# Patient Record
Sex: Male | Born: 1950 | Race: Black or African American | Hispanic: No | Marital: Married | Smoking: Former smoker
Health system: Southern US, Community
[De-identification: ages and names within clinical notes are randomized; demographics above are authoritative.]

## PROBLEM LIST (undated history)

## (undated) DIAGNOSIS — I509 Heart failure, unspecified: Secondary | ICD-10-CM

## (undated) DIAGNOSIS — N189 Chronic kidney disease, unspecified: Secondary | ICD-10-CM

## (undated) DIAGNOSIS — Z95810 Presence of automatic (implantable) cardiac defibrillator: Secondary | ICD-10-CM

## (undated) DIAGNOSIS — I255 Ischemic cardiomyopathy: Secondary | ICD-10-CM

## (undated) DIAGNOSIS — N200 Calculus of kidney: Secondary | ICD-10-CM

## (undated) DIAGNOSIS — I472 Ventricular tachycardia, unspecified: Secondary | ICD-10-CM

## (undated) DIAGNOSIS — E11319 Type 2 diabetes mellitus with unspecified diabetic retinopathy without macular edema: Secondary | ICD-10-CM

## (undated) DIAGNOSIS — Z7901 Long term (current) use of anticoagulants: Secondary | ICD-10-CM

## (undated) DIAGNOSIS — I4892 Unspecified atrial flutter: Secondary | ICD-10-CM

## (undated) DIAGNOSIS — E119 Type 2 diabetes mellitus without complications: Secondary | ICD-10-CM

## (undated) DIAGNOSIS — I5022 Chronic systolic (congestive) heart failure: Secondary | ICD-10-CM

## (undated) DIAGNOSIS — E7800 Pure hypercholesterolemia, unspecified: Secondary | ICD-10-CM

## (undated) DIAGNOSIS — I2510 Atherosclerotic heart disease of native coronary artery without angina pectoris: Secondary | ICD-10-CM

## (undated) DIAGNOSIS — I10 Essential (primary) hypertension: Secondary | ICD-10-CM

## (undated) HISTORY — DX: Ventricular tachycardia, unspecified: I47.20

## (undated) HISTORY — DX: Chronic kidney disease, unspecified: N18.9

## (undated) HISTORY — DX: Essential (primary) hypertension: I10

## (undated) HISTORY — DX: Unspecified atrial flutter: I48.92

## (undated) HISTORY — DX: Calculus of kidney: N20.0

## (undated) HISTORY — DX: Chronic systolic (congestive) heart failure: I50.22

## (undated) HISTORY — DX: Atherosclerotic heart disease of native coronary artery without angina pectoris: I25.10

## (undated) HISTORY — DX: Type 2 diabetes mellitus without complications: E11.9

## (undated) HISTORY — DX: Ischemic cardiomyopathy: I25.5

## (undated) HISTORY — DX: Pure hypercholesterolemia, unspecified: E78.00

## (undated) HISTORY — DX: Ventricular tachycardia: I47.2

---

## 2000-03-02 HISTORY — PX: PERCUTANEOUS CORONARY STENT INTERVENTION (PCI-S): SHX6016

## 2000-03-31 ENCOUNTER — Inpatient Hospital Stay (HOSPITAL_COMMUNITY): Admission: EM | Admit: 2000-03-31 | Discharge: 2000-04-02 | Payer: Self-pay | Admitting: Emergency Medicine

## 2000-03-31 ENCOUNTER — Encounter: Payer: Self-pay | Admitting: Emergency Medicine

## 2000-07-31 HISTORY — PX: PERCUTANEOUS CORONARY STENT INTERVENTION (PCI-S): SHX6016

## 2000-08-18 ENCOUNTER — Inpatient Hospital Stay (HOSPITAL_COMMUNITY): Admission: EM | Admit: 2000-08-18 | Discharge: 2000-08-23 | Payer: Self-pay | Admitting: Emergency Medicine

## 2000-08-18 ENCOUNTER — Encounter: Payer: Self-pay | Admitting: Emergency Medicine

## 2000-08-24 ENCOUNTER — Inpatient Hospital Stay (HOSPITAL_COMMUNITY): Admission: EM | Admit: 2000-08-24 | Discharge: 2000-08-25 | Payer: Self-pay | Admitting: Emergency Medicine

## 2000-08-25 ENCOUNTER — Encounter: Payer: Self-pay | Admitting: Emergency Medicine

## 2000-09-01 ENCOUNTER — Encounter: Admission: RE | Admit: 2000-09-01 | Discharge: 2000-09-01 | Payer: Self-pay | Admitting: Family Medicine

## 2000-11-29 ENCOUNTER — Encounter: Admission: RE | Admit: 2000-11-29 | Discharge: 2000-11-29 | Payer: Self-pay | Admitting: Sports Medicine

## 2000-12-01 ENCOUNTER — Encounter: Payer: Self-pay | Admitting: Emergency Medicine

## 2000-12-01 ENCOUNTER — Emergency Department (HOSPITAL_COMMUNITY): Admission: EM | Admit: 2000-12-01 | Discharge: 2000-12-01 | Payer: Self-pay

## 2001-10-08 ENCOUNTER — Emergency Department (HOSPITAL_COMMUNITY): Admission: EM | Admit: 2001-10-08 | Discharge: 2001-10-08 | Payer: Self-pay | Admitting: *Deleted

## 2001-10-08 ENCOUNTER — Encounter: Payer: Self-pay | Admitting: *Deleted

## 2004-08-30 HISTORY — PX: PERCUTANEOUS CORONARY STENT INTERVENTION (PCI-S): SHX6016

## 2004-09-20 ENCOUNTER — Inpatient Hospital Stay (HOSPITAL_COMMUNITY): Admission: EM | Admit: 2004-09-20 | Discharge: 2004-09-23 | Payer: Self-pay | Admitting: Emergency Medicine

## 2004-09-22 ENCOUNTER — Encounter: Payer: Self-pay | Admitting: Cardiology

## 2004-09-22 ENCOUNTER — Ambulatory Visit: Payer: Self-pay | Admitting: Cardiology

## 2004-10-07 ENCOUNTER — Ambulatory Visit: Payer: Self-pay | Admitting: Cardiology

## 2004-10-14 ENCOUNTER — Ambulatory Visit: Payer: Self-pay | Admitting: Cardiology

## 2005-01-07 ENCOUNTER — Ambulatory Visit: Payer: Self-pay

## 2005-01-08 ENCOUNTER — Ambulatory Visit: Payer: Self-pay | Admitting: Cardiology

## 2005-03-02 HISTORY — PX: CARDIAC DEFIBRILLATOR PLACEMENT: SHX171

## 2005-10-02 ENCOUNTER — Ambulatory Visit: Payer: Self-pay | Admitting: Internal Medicine

## 2005-11-16 ENCOUNTER — Ambulatory Visit: Payer: Self-pay | Admitting: Cardiology

## 2005-11-30 ENCOUNTER — Ambulatory Visit: Payer: Self-pay

## 2006-01-04 ENCOUNTER — Ambulatory Visit: Payer: Self-pay | Admitting: Internal Medicine

## 2006-02-10 ENCOUNTER — Ambulatory Visit: Payer: Self-pay | Admitting: Internal Medicine

## 2006-02-17 ENCOUNTER — Observation Stay (HOSPITAL_COMMUNITY): Admission: AD | Admit: 2006-02-17 | Discharge: 2006-02-18 | Payer: Self-pay | Admitting: Internal Medicine

## 2006-02-17 ENCOUNTER — Ambulatory Visit: Payer: Self-pay | Admitting: Internal Medicine

## 2006-02-25 ENCOUNTER — Ambulatory Visit: Payer: Self-pay

## 2006-04-29 DIAGNOSIS — I2510 Atherosclerotic heart disease of native coronary artery without angina pectoris: Secondary | ICD-10-CM

## 2006-04-29 DIAGNOSIS — I252 Old myocardial infarction: Secondary | ICD-10-CM

## 2006-04-29 DIAGNOSIS — N200 Calculus of kidney: Secondary | ICD-10-CM

## 2006-04-29 DIAGNOSIS — E7800 Pure hypercholesterolemia, unspecified: Secondary | ICD-10-CM | POA: Insufficient documentation

## 2006-04-29 DIAGNOSIS — I10 Essential (primary) hypertension: Secondary | ICD-10-CM

## 2006-05-12 ENCOUNTER — Ambulatory Visit: Payer: Self-pay | Admitting: Internal Medicine

## 2008-08-03 ENCOUNTER — Telehealth: Payer: Self-pay | Admitting: Internal Medicine

## 2008-08-04 ENCOUNTER — Telehealth: Payer: Self-pay | Admitting: Adult Health

## 2008-08-06 ENCOUNTER — Ambulatory Visit: Payer: Self-pay | Admitting: Internal Medicine

## 2008-08-11 DIAGNOSIS — I255 Ischemic cardiomyopathy: Secondary | ICD-10-CM

## 2008-08-11 DIAGNOSIS — E1121 Type 2 diabetes mellitus with diabetic nephropathy: Secondary | ICD-10-CM | POA: Insufficient documentation

## 2008-08-11 DIAGNOSIS — E785 Hyperlipidemia, unspecified: Secondary | ICD-10-CM

## 2008-08-11 DIAGNOSIS — N259 Disorder resulting from impaired renal tubular function, unspecified: Secondary | ICD-10-CM | POA: Insufficient documentation

## 2008-08-14 ENCOUNTER — Ambulatory Visit: Payer: Self-pay | Admitting: Internal Medicine

## 2008-08-14 DIAGNOSIS — Z95810 Presence of automatic (implantable) cardiac defibrillator: Secondary | ICD-10-CM

## 2008-08-14 DIAGNOSIS — I472 Ventricular tachycardia: Secondary | ICD-10-CM

## 2008-10-10 ENCOUNTER — Encounter: Payer: Self-pay | Admitting: Cardiology

## 2008-11-13 ENCOUNTER — Ambulatory Visit: Payer: Self-pay | Admitting: Cardiology

## 2008-11-13 DIAGNOSIS — R079 Chest pain, unspecified: Secondary | ICD-10-CM

## 2008-12-24 ENCOUNTER — Telehealth (INDEPENDENT_AMBULATORY_CARE_PROVIDER_SITE_OTHER): Payer: Self-pay | Admitting: *Deleted

## 2008-12-25 ENCOUNTER — Ambulatory Visit: Payer: Self-pay | Admitting: Cardiology

## 2008-12-25 ENCOUNTER — Encounter (HOSPITAL_COMMUNITY): Admission: RE | Admit: 2008-12-25 | Discharge: 2009-02-27 | Payer: Self-pay | Admitting: Cardiology

## 2008-12-25 ENCOUNTER — Ambulatory Visit: Payer: Self-pay

## 2008-12-26 ENCOUNTER — Encounter (INDEPENDENT_AMBULATORY_CARE_PROVIDER_SITE_OTHER): Payer: Self-pay | Admitting: *Deleted

## 2008-12-26 LAB — CONVERTED CEMR LAB
AST: 18 units/L (ref 0–37)
Albumin: 3.5 g/dL (ref 3.5–5.2)
BUN: 16 mg/dL (ref 6–23)
CO2: 25 meq/L (ref 19–32)
Calcium: 8.9 mg/dL (ref 8.4–10.5)
Cholesterol: 177 mg/dL (ref 0–200)
Glucose, Bld: 321 mg/dL — ABNORMAL HIGH (ref 70–99)
Potassium: 4.7 meq/L (ref 3.5–5.1)
Sodium: 135 meq/L (ref 135–145)
Total CHOL/HDL Ratio: 5
Total Protein: 7.8 g/dL (ref 6.0–8.3)
Triglycerides: 61 mg/dL (ref 0.0–149.0)
VLDL: 12.2 mg/dL (ref 0.0–40.0)

## 2008-12-27 ENCOUNTER — Encounter: Payer: Self-pay | Admitting: Cardiology

## 2009-03-15 ENCOUNTER — Encounter (INDEPENDENT_AMBULATORY_CARE_PROVIDER_SITE_OTHER): Payer: Self-pay | Admitting: *Deleted

## 2009-05-06 ENCOUNTER — Encounter: Payer: Self-pay | Admitting: Cardiology

## 2009-11-18 ENCOUNTER — Encounter (INDEPENDENT_AMBULATORY_CARE_PROVIDER_SITE_OTHER): Payer: Self-pay | Admitting: *Deleted

## 2010-02-27 ENCOUNTER — Encounter: Payer: Self-pay | Admitting: Cardiology

## 2010-03-19 ENCOUNTER — Encounter (INDEPENDENT_AMBULATORY_CARE_PROVIDER_SITE_OTHER): Payer: Self-pay | Admitting: *Deleted

## 2010-04-01 NOTE — Letter (Signed)
Summary: Appointment - Reminder 2  [facility], Main Office  [address]   [place], [place] [address]   Phone: [phone]  Fax: [phone]     March 15, 2009 MRN: [id number]   MARVEN VELEY [address] [place], [place]  [address]   Dear Mr. Hiley,  Our records indicate that it is time to schedule a follow-up appointment. Dr.Crenshaw recommended that you follow up with [place] in March,2011. It is very important that we reach you to schedule this appointment. We look forward to participating in your health care needs. Please contact us at the number listed above at your earliest convenience to schedule your appointment.  If you are unable to make an appointment at this time, give [place] a call so we can update our records.     Sincerely,   Glass blower/designer

## 2010-04-01 NOTE — Letter (Signed)
Summary: Device-Delinquent Check  [facility], Main Office  [address]   [place], [place] [address]   Phone: [phone]  Fax: [phone]     November 18, 2009 MRN: [id number]   [name] [name] [address] [place], [place]  [address]   Dear [name],  According to our records, you have not had your implanted device checked in the recommended period of time.  We are unable to determine appropriate device function without checking your device on a regular basis.  Please call our office to schedule an appointment as soon as possible.  If you are having your device checked by another physician, please call us so that we may update our records.  Thank you,  Altha Harm, LPN  November 18, 2009 9:06 AM  [facility]

## 2010-04-01 NOTE — Letter (Signed)
Summary: [facility] Office Note  [facility] Office Note   Imported By: Roderic Ovens 06/03/2009 16:49:45  _____________________________________________________________________  External Attachment:    Type:   Image     Comment:   External Document

## 2010-04-02 ENCOUNTER — Encounter: Payer: Self-pay | Admitting: Internal Medicine

## 2010-04-02 ENCOUNTER — Encounter (INDEPENDENT_AMBULATORY_CARE_PROVIDER_SITE_OTHER): Payer: BC Managed Care – PPO | Admitting: Internal Medicine

## 2010-04-02 DIAGNOSIS — I2589 Other forms of chronic ischemic heart disease: Secondary | ICD-10-CM

## 2010-04-02 DIAGNOSIS — I5022 Chronic systolic (congestive) heart failure: Secondary | ICD-10-CM

## 2010-04-03 NOTE — Letter (Signed)
Summary: Device-Delinquent Check  [facility], Main Office  [address]   [place], [place] [address]   Phone: [phone]  Fax: [phone]     March 19, 2010 MRN: [id number]   [name] [name] [address] [place], [place]  [address]   Dear Mr. Schabel,  According to our records, you have not had your implanted device checked in the recommended period of time.  We are unable to determine appropriate device function without checking your device on a regular basis.  Please call our office to schedule an appointment , with Dr Ladona Ridgel, as soon as possible.  If you are having your device checked by another physician, please call us so that we may update our records.  Thank you,  Letta Moynahan, EMT  March 19, 2010 2:46 PM  [facility] certified

## 2010-04-03 NOTE — Cardiovascular Report (Signed)
Summary: Notice Returned - Unable to [employer] Returned - Unable to [employer]   Imported By: Debby Freiberg 03/20/2010 11:40:29  _____________________________________________________________________  External Attachment:    Type:   Image     Comment:   External Document

## 2010-04-03 NOTE — Letter (Signed)
Summary: [facility] Referral Response Note   [facility] Referral Response Note   Imported By: Roderic Ovens 03/28/2010 12:44:16  _____________________________________________________________________  External Attachment:    Type:   Image     Comment:   External Document

## 2010-04-04 ENCOUNTER — Ambulatory Visit (INDEPENDENT_AMBULATORY_CARE_PROVIDER_SITE_OTHER): Payer: BC Managed Care – PPO | Admitting: Cardiology

## 2010-04-04 ENCOUNTER — Encounter: Payer: Self-pay | Admitting: Cardiology

## 2010-04-04 ENCOUNTER — Ambulatory Visit: Admit: 2010-04-04 | Payer: Self-pay | Admitting: Cardiology

## 2010-04-04 ENCOUNTER — Other Ambulatory Visit: Payer: Self-pay | Admitting: Cardiology

## 2010-04-04 DIAGNOSIS — I10 Essential (primary) hypertension: Secondary | ICD-10-CM

## 2010-04-04 DIAGNOSIS — I2510 Atherosclerotic heart disease of native coronary artery without angina pectoris: Secondary | ICD-10-CM

## 2010-04-04 DIAGNOSIS — E7800 Pure hypercholesterolemia, unspecified: Secondary | ICD-10-CM

## 2010-04-04 DIAGNOSIS — I2589 Other forms of chronic ischemic heart disease: Secondary | ICD-10-CM

## 2010-04-04 LAB — HEPATIC FUNCTION PANEL
ALT: 15 U/L (ref 0–53)
AST: 22 U/L (ref 0–37)
Bilirubin, Direct: 0.2 mg/dL (ref 0.0–0.3)
Total Bilirubin: 0.4 mg/dL (ref 0.3–1.2)
Total Protein: 7.3 g/dL (ref 6.0–8.3)

## 2010-04-04 LAB — LIPID PANEL
HDL: 34.2 mg/dL — ABNORMAL LOW (ref >39.00)
LDL Cholesterol: 97 mg/dL (ref 0–99)
VLDL: 5.8 mg/dL (ref 0.0–40.0)

## 2010-04-04 LAB — BASIC METABOLIC PANEL
CO2: 26 mEq/L (ref 19–32)
Calcium: 9.3 mg/dL (ref 8.4–10.5)
Creatinine, Ser: 0.9 mg/dL (ref 0.4–1.5)
Potassium: 4.6 mEq/L (ref 3.5–5.1)

## 2010-04-09 NOTE — Assessment & Plan Note (Signed)
Summary: pc2   Vital Signs:  Patient profile:   60 year old male Height:      71 inches Weight:      247 pounds BMI:     34.57 Pulse rate:   68 / minute BP sitting:   140 / 90  (left arm)  Vitals Entered By: Laurance Flatten CMA (April 02, 2010 9:48 AM)  Primary Provider:  [facility] [name] Lesly Rubenstein   History of Present Illness: [name] [name] returns today for followup.  He has an ICM, CHF (class 1-2) and HTN. He notes that he received an ICD shock several weeks ago when he was chasing one of his beagles while rabbit hunting.  He denies c/p or sob or PND or peripheral edema.  Current Medications (verified): 1)  Aspirin 325 Mg Tabs (Aspirin) 2)  Carvedilol 6.25 Mg Tabs (Carvedilol) .... Take One Tablet By Mouth Twice A Day 3)  Cozaar 50 Mg Tabs (Losartan Potassium) .Marland Kitchen.. 1 Tab Once Daily 4)  Crestor 20 Mg Tabs (Rosuvastatin Calcium) .Marland Kitchen.. 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    Physical Exam  General:  well-developed well-nourished in no acute distress. Skin is warm and dry. Head:  HEENT is normal. Eyes:  PERRLA/EOM intact; conjunctiva and lids normal. Mouth:  Teeth, gums and palate normal. Oral mucosa normal. Neck:  supple with no bruits. No thyromegaly. Chest Wall:  Well healed ICD incision. Lungs:  clear to auscultation Heart:  regular rate and rhythm Abdomen:  soft and nontender. No masses  palpated. Msk:  Back normal, normal gait. Muscle strength and tone normal. Pulses:  pulses normal in all 4 extremities Extremities:  no edema. Neurologic:  grossly intact.   Impression & Recommendations:  Problem # 1:  AUTOMATIC IMPLANTABLE CARDIAC DEFIBRILLATOR SITU (ICD-V45.02) His device is working normally. Will recheck in several months.  Problem # 2:  VENTRICULAR TACHYCARDIA (ICD-427.1) His symptoms are well controlled. Will follow. His updated medication list for this problem includes:    Aspirin 325 Mg Tabs (Aspirin)    Carvedilol 6.25 Mg Tabs (Carvedilol) .Marland Kitchen... Take one tablet by mouth twice a day  Problem # 3:  CARDIOMYOPATHY, ISCHEMIC (ICD-414.8) He denies anginal symptoms. His updated medication list for this problem includes:    Aspirin 325 Mg Tabs (Aspirin)    Carvedilol 6.25 Mg Tabs (Carvedilol) .Marland Kitchen... Take one tablet by mouth twice a day    Cozaar 50 Mg Tabs (Losartan potassium) .Marland Kitchen... 1 tab once daily  Patient Instructions: 1)  Your physician wants you to follow-up in: 3 months with device clinic and 12 months with [name] [name] will receive a reminder letter in the mail two months in advance. If you don't receive a letter, please call our office to schedule the follow-up appointment. 2)  Your physician recommends that you continue on your current medications as directed. Please refer to the Current Medication list given to you today.       ICD Specifications Following MD:  [name] Bunting, MD     ICD Vendor:  [facility]     ICD Model  Number:  7232     ICD Serial Number:  [id number] ICD DOI:  02/17/2006     ICD Implanting MD:  [name] Bunting, MD  Lead 1:    Location: RV     DOI: 02/17/2006     Model #: 9147     Serial #: [id number]     Status: active  Indications::  ICM   ICD Follow Up Battery Voltage:  3.04 V     Charge Time:  7.80 seconds     Underlying rhythm:  SR ICD Dependent:  No       ICD Device Measurements Right Ventricle:  Amplitude: 7.2 mV,  Impedance: 424 ohms, Threshold: 1.4 V at 0.2 msec Shock Impedance: 53/68 ohms   Episodes MS Episodes:  0     Shock:  1     ATP:  19     Nonsustained:  39     Ventricular Pacing:  <0.1%  Brady Parameters Mode VVI     Lower Rate Limit:  40      Tachy Zones VF:  200     VT:  250     VT1:  171     Next Cardiology Appt Due:  07/01/2010 Tech Comments:  4 FVT EPISODES AND 14 VT EPISODES---SEVERAL EPISODES WITH ATP THERAPY.  EPISODE ON 02-10-10 2 UNSUCCESSFUL ATP RESULTING IN 15 JOULE SHOCK SUCCESSFUL.  PT WAS HUNTING AND RUNNING AT TIME OF EPISODE.  NORMAL DEVICE FUNCTION. INSTALLED LIA AND DEMONSTRATED TONE FOR PT--PT AWARE TO CALL IF HEARD.  NO CHANGES MADE. PT PREFERS [facility] THAN [facility]. ROV IN 3 MTHS W/DEVICE CLINIC. [name] [name]  April 02, 2010 10:04 AM MD Comments:  Agree with above. Unclear whether his episodes are VT. I suspect sinus tachy.

## 2010-04-09 NOTE — Assessment & Plan Note (Signed)
Summary: 6 month/past [email or web address]  if he shows up, he needs his devic...   Vital Signs:  Patient profile:   60 year old male Height:      71 inches Weight:      247 pounds BMI:     34.57 Pulse rate:   85 / minute Resp:     16 per minute BP sitting:   120 / 80  (left arm)  Vitals Entered By: Kem Parkinson (April 04, 2010 8:55 AM)   Primary Provider:  [facility] Dr Lesly Rubenstein   History of Present Illness: Pleasant gentleman with history of coronary disease who returns for followup. I have not seen him since September of 2007. He has had previous PCI of his right coronary artery and his LAD. His last cardiac catheterization was performed on September 21, 2004 in the setting of an acute anterior myocardial infarction. His ejection fraction was 35-40%. He had a 99% mid LAD lesion as well as a 90% distal LAD lesion. There was a 70% mid to distal circumflex and the right coronary artery was totally occluded. The patient had PCI of his mid and distal  LAD at that time. The mid LAD lesion was treated with a drug eluding stent. He's also had a previous ICD. His last echocardiogram was performed on January 07, 2005. His ejection fraction was 30-40%. There was mild to moderate mitral regurgitation. Last Myoview was performed in October of 2010. Ejection fraction was 27%. There was a prior apical and inferior infarct but no ischemia. I last saw him in September 2010. Since then, the patient has dyspnea with more extreme activities but not with routine activities. It is relieved with rest. It is not associated with chest pain. There is no orthopnea, PND or pedal edema. There is no syncope or palpitations. There is no exertional chest pain. His ICD fired several weeks ago while he was chasing his adult. He has seen Dr. Ladona Ridgel since then.   Current Medications (verified): 1)  Aspirin 325 Mg Tabs (Aspirin) .... Tab By Mouth Once Daily 2)  Carvedilol 6.25 Mg Tabs (Carvedilol) .... Take One Tablet  By Mouth Twice A Day 3)  Cozaar 50 Mg Tabs (Losartan Potassium) .Marland Kitchen.. 1 Tab Once Daily 4)  Crestor 20 Mg Tabs (Rosuvastatin Calcium) .Marland Kitchen.. 1 Once Daily 5)  Insulin .Marland Kitchen.. 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 Remaining systems are negative.   Physical Exam  General:  Well-developed well-nourished in no acute distress.  Skin is warm and dry.  HEENT is normal.  Neck is supple. No thyromegaly.  Chest is clear to auscultation with normal expansion.  Cardiovascular exam is regular rate and rhythm.  Abdominal exam nontender or distended. No masses palpated. Extremities show no edema. neuro grossly intact  Electrocardiogram shows sinus rhythm with PVCs, prior  inferior and septal infarct.   Impression & Recommendations:  Problem # 1:  AUTOMATIC IMPLANTABLE CARDIAC DEFIBRILLATOR SITU (ICD-V45.02) Managed by electrophysiology.  Problem # 2:  RENAL INSUFFICIENCY (ICD-588.9) Check potassium and renal function.  Problem # 3:  DIABETES MELLITUS, TYPE II (ICD-250.00) Management per primary care. His updated medication list for this  problem includes:    Aspirin 325 Mg Tabs (Aspirin) .Marland Kitchen... Tab by mouth once daily    Cozaar 50 Mg Tabs (Losartan potassium) .Marland Kitchen... 1 tab once daily  Problem # 4:  DYSLIPIDEMIA (ICD-272.4) Continue statin. Check lipids and liver. His updated medication list for this problem includes:    Crestor 20 Mg Tabs (Rosuvastatin calcium) .Marland Kitchen... 1 once daily  Orders: TLB-Lipid Panel (80061-LIPID) TLB-Hepatic/Liver Function Pnl (80076-HEPATIC)  Problem # 5:  CARDIOMYOPATHY, ISCHEMIC (ICD-414.8) Continue ARB and beta blocker. His updated medication list for this problem includes:    Aspirin 325 Mg Tabs (Aspirin) .Marland Kitchen... Tab by mouth once daily    Carvedilol 6.25 Mg Tabs (Carvedilol) .Marland Kitchen... Take one tablet by mouth twice a day    Cozaar 50 Mg Tabs (Losartan potassium) .Marland Kitchen... 1 tab once daily  Problem # 6:  HYPERTENSION, BENIGN SYSTEMIC (ICD-401.1) Blood pressure controlled on present medications. Will continue. His updated medication list for this problem includes:    Aspirin 325 Mg Tabs (Aspirin) .Marland Kitchen... Tab by mouth once daily    Carvedilol 6.25 Mg Tabs (Carvedilol) .Marland Kitchen... Take one tablet by mouth twice a day    Cozaar 50 Mg Tabs (Losartan potassium) .Marland Kitchen... 1 tab once daily  Orders: TLB-BMP (Basic Metabolic Panel-BMET) (80048-METABOL)  Problem # 7:  CORONARY, ARTERIOSCLEROSIS (ICD-414.00) Continue aspirin, beta blocker and statin. His updated medication list for this problem includes:    Aspirin 325 Mg Tabs (Aspirin) .Marland Kitchen... Tab by mouth once daily    Carvedilol 6.25 Mg Tabs (Carvedilol) .Marland Kitchen... Take one tablet by mouth twice a day  Patient Instructions: 1)  Your physician wants you to follow-up in:one year   You will receive a reminder letter in the mail two months in advance. If you don't receive a letter, please call our office to schedule the follow-up appointment.   Orders Added: 1)  TLB-BMP (Basic Metabolic Panel-BMET) [80048-METABOL] 2)  TLB-Lipid Panel [80061-LIPID] 3)  TLB-Hepatic/Liver  Function Pnl [80076-HEPATIC]

## 2010-04-14 ENCOUNTER — Encounter (INDEPENDENT_AMBULATORY_CARE_PROVIDER_SITE_OTHER): Payer: Self-pay | Admitting: *Deleted

## 2010-04-23 NOTE — Letter (Signed)
Summary: Custom - Lipid  [facility], Main Office  [address]   [place], [place] [address]   Phone: [phone]  Fax: [phone]     April 14, 2010 MRN: [id number]   [name] [name] [address] [place], [place]  [address]   Dear [name],  We have reviewed your cholesterol results.  They are as follows:     Total Cholesterol:    137 (Desirable: less than 200)       HDL  Cholesterol:     34.20  (Desirable: greater than 40 for men and 50 for women)       LDL Cholesterol:       97  (Desirable: less than 100 for low risk and less than 70 for moderate to high risk)       Triglycerides:       29.0  (Desirable: less than 150)  Our recommendations include:These numbers are alittle higher than Dr Ludwig Clarks goal for you cholestrol. I have been unable to reach you by phone, please give me a call so we can discuss the changes in your medicine Dr [name] would like to make. Thank You, [name] [name]   Call our office at the number listed above if you have any questions.  Lowering your LDL cholesterol is important, but it is only one of a large number of "risk factors" that may indicate that you are at risk for heart disease, stroke or other complications of hardening of the arteries.  Other risk factors include:   A.  Cigarette Smoking* B.  High Blood Pressure* C.  Obesity* D.   Low HDL Cholesterol (see yours above)* E.   Diabetes Mellitus (higher risk if your is uncontrolled) F.  Family history of premature heart disease G.  Previous history of stroke or cardiovascular disease    *These are risk factors YOU HAVE CONTROL OVER.  For more information, visit .  There is now evidence that lowering the TOTAL CHOLESTEROL AND LDL CHOLESTEROL can reduce the risk of heart disease.  The American Heart Association recommends the following guidelines for the treatment of elevated cholesterol:  1.  If there is now current heart disease and less than two risk factors, TOTAL CHOLESTEROL  should be less than 200 and LDL CHOLESTEROL should be less than 100. 2.  If there is current heart disease or two or more risk factors, TOTAL CHOLESTEROL should be less than 200 and LDL CHOLESTEROL should be less than 70.  A diet low in cholesterol, saturated fat, and calories is the cornerstone of treatment for elevated cholesterol.  Cessation of smoking and exercise are also important in the management of elevated cholesterol and preventing vascular disease.  Studies have shown that 30 to 60 minutes of physical activity most days can help lower blood pressure, lower cholesterol, and keep your weight at a healthy level.  Drug therapy is used when cholesterol levels do not respond to therapeutic lifestyle changes (smoking cessation, diet, and exercise) and remains unacceptably high.  If medication is started, it is important to have you levels checked periodically to evaluate the need for further treatment options.  Thank you,    [employer] Team

## 2010-04-25 ENCOUNTER — Emergency Department (HOSPITAL_COMMUNITY): Payer: BC Managed Care – PPO

## 2010-04-25 ENCOUNTER — Inpatient Hospital Stay (HOSPITAL_COMMUNITY)
Admission: EM | Admit: 2010-04-25 | Discharge: 2010-04-28 | DRG: 138 | Disposition: A | Discharge status: Home / Self Care | Payer: BC Managed Care – PPO | Attending: Cardiology | Admitting: Cardiology

## 2010-04-25 DIAGNOSIS — I472 Ventricular tachycardia, unspecified: Principal | ICD-10-CM | POA: Diagnosis present

## 2010-04-25 DIAGNOSIS — I252 Old myocardial infarction: Secondary | ICD-10-CM

## 2010-04-25 DIAGNOSIS — Z9861 Coronary angioplasty status: Secondary | ICD-10-CM

## 2010-04-25 DIAGNOSIS — N189 Chronic kidney disease, unspecified: Secondary | ICD-10-CM | POA: Diagnosis present

## 2010-04-25 DIAGNOSIS — I2510 Atherosclerotic heart disease of native coronary artery without angina pectoris: Secondary | ICD-10-CM | POA: Diagnosis present

## 2010-04-25 DIAGNOSIS — I2589 Other forms of chronic ischemic heart disease: Secondary | ICD-10-CM | POA: Diagnosis present

## 2010-04-25 DIAGNOSIS — Z880 Allergy status to penicillin: Secondary | ICD-10-CM

## 2010-04-25 DIAGNOSIS — Z87891 Personal history of nicotine dependence: Secondary | ICD-10-CM

## 2010-04-25 DIAGNOSIS — I129 Hypertensive chronic kidney disease with stage 1 through stage 4 chronic kidney disease, or unspecified chronic kidney disease: Secondary | ICD-10-CM | POA: Diagnosis present

## 2010-04-25 DIAGNOSIS — E785 Hyperlipidemia, unspecified: Secondary | ICD-10-CM | POA: Diagnosis present

## 2010-04-25 DIAGNOSIS — E119 Type 2 diabetes mellitus without complications: Secondary | ICD-10-CM | POA: Diagnosis present

## 2010-04-25 DIAGNOSIS — E7800 Pure hypercholesterolemia, unspecified: Secondary | ICD-10-CM | POA: Diagnosis present

## 2010-04-25 DIAGNOSIS — Z7982 Long term (current) use of aspirin: Secondary | ICD-10-CM

## 2010-04-25 DIAGNOSIS — Z794 Long term (current) use of insulin: Secondary | ICD-10-CM

## 2010-04-25 DIAGNOSIS — I4729 Other ventricular tachycardia: Principal | ICD-10-CM | POA: Diagnosis present

## 2010-04-25 DIAGNOSIS — Z95810 Presence of automatic (implantable) cardiac defibrillator: Secondary | ICD-10-CM

## 2010-04-25 LAB — CBC
HCT: 42.1 % (ref 39.0–52.0)
Hemoglobin: 14.3 g/dL (ref 13.0–17.0)
MCHC: 34 g/dL (ref 30.0–36.0)
RBC: 5.23 MIL/uL (ref 4.22–5.81)

## 2010-04-25 LAB — POCT I-STAT, CHEM 8
BUN: 17 mg/dL (ref 6–23)
Calcium, Ion: 1.14 mmol/L (ref 1.12–1.32)
Chloride: 105 mEq/L (ref 96–112)
Creatinine, Ser: 1.2 mg/dL (ref 0.4–1.5)
Glucose, Bld: 273 mg/dL — ABNORMAL HIGH (ref 70–99)
HCT: 47 % (ref 39.0–52.0)
Hemoglobin: 16 g/dL (ref 13.0–17.0)
Potassium: 4.3 meq/L (ref 3.5–5.1)
Sodium: 137 mEq/L (ref 135–145)
TCO2: 21 mmol/L (ref 0–100)

## 2010-04-25 LAB — DIFFERENTIAL
Basophils Absolute: 0 10*3/uL (ref 0.0–0.1)
Eosinophils Relative: 4 % (ref 0–5)
Neutro Abs: 2.1 10*3/uL (ref 1.7–7.7)

## 2010-04-26 DIAGNOSIS — I472 Ventricular tachycardia: Secondary | ICD-10-CM

## 2010-04-26 LAB — POCT CARDIAC MARKERS
CKMB, poc: 1.1 ng/mL (ref 1.0–8.0)
Myoglobin, poc: 78.4 ng/mL (ref 12–200)
Troponin i, poc: <0.05 ng/mL (ref 0.00–0.09)

## 2010-04-26 LAB — GLUCOSE, CAPILLARY
Glucose-Capillary: 199 mg/dL — ABNORMAL HIGH (ref 70–99)
Glucose-Capillary: 86 mg/dL (ref 70–99)

## 2010-04-26 LAB — MAGNESIUM: Magnesium: 1.9 mg/dL (ref 1.5–2.5)

## 2010-04-26 LAB — CARDIAC PANEL(CRET KIN+CKTOT+MB+TROPI)
CK, MB: 2 ng/mL (ref 0.3–4.0)
Relative Index: 1.4 (ref 0.0–2.5)
Troponin I: 0.05 ng/mL (ref 0.00–0.06)

## 2010-04-26 LAB — BASIC METABOLIC PANEL
BUN: 11 mg/dL (ref 6–23)
CO2: 28 mEq/L (ref 19–32)
Creatinine, Ser: 0.98 mg/dL (ref 0.4–1.5)
GFR calc Af Amer: >60 mL/min (ref >60)
Glucose, Bld: 243 mg/dL — ABNORMAL HIGH (ref 70–99)
Potassium: 4.5 mEq/L (ref 3.5–5.1)
Sodium: 140 mEq/L (ref 135–145)

## 2010-04-26 LAB — CK TOTAL AND CKMB (NOT AT ARMC)
CK, MB: 2.1 ng/mL (ref 0.3–4.0)
Relative Index: 1.3 (ref 0.0–2.5)
Total CK: 161 U/L (ref 7–232)

## 2010-04-26 LAB — TROPONIN I: Troponin I: 0.04 ng/mL (ref 0.00–0.06)

## 2010-04-26 LAB — CBC
RBC: 5.45 MIL/uL (ref 4.22–5.81)
WBC: 4.9 10*3/uL (ref 4.0–10.5)

## 2010-04-27 LAB — GLUCOSE, CAPILLARY
Glucose-Capillary: 89 mg/dL (ref 70–99)
Glucose-Capillary: 126 mg/dL — ABNORMAL HIGH (ref 70–99)

## 2010-04-27 LAB — HEMOGLOBIN A1C: Mean Plasma Glucose: 229 mg/dL — ABNORMAL HIGH (ref <117)

## 2010-05-09 NOTE — Discharge Summary (Signed)
NAME:  [name] [name], RISE NO.:  [id number]  MEDICAL RECORD NO.:  [id number]           PATIENT TYPE:  I  LOCATION:  2021                         FACILITY:  [facility]  PHYSICIAN:  [name]. [name], MD, FACCDATE OF BIRTH:  December 31, 1950  DATE OF ADMISSION:  04/26/2010 DATE OF DISCHARGE:  04/28/2010                              DISCHARGE SUMMARY   PRIMARY CARDIOLOGIST:  [name]. [name], MD, [facility]  ELECTROPHYSIOLOGIST:  Doylene Canning. Ladona Ridgel, MD  DISCHARGE DIAGNOSES: 1. Ventricular tachycardia with subsequent ICD discharge.     a.     Failed ATP with subsequent ICD discharge.     b.     Addition of sotalol 120 mg p.o. b.i.d. and device      interrogation resulting in changes (VF 200-500, NID changed from      18/24 to 24/32.  FVT via VF 200-250, VT 171-200 changed to 158-      200, NID changed from 16-36.  RV output decreased to 2.5 V per      protocol).     c.     EKG April 26, 2010:  NSR with frequent PVC, T-wave      inversion in V3-V6, minimal T-wave inversion in I and aVL as well      as inferior leads, minimal T-wave inversion in inferior leads      also, but mostly flat, ?Q-waves in V1, V2 as well as inferior      leads, delayed R-wave progression, left axis deviation, PR 176,      QRS 110, QT 398, QTC 438.  EKG April 28, 2010:  No significant change from prior tracing except for T-wave inversion resolved, more pronounced R-wave progression delay, no PVCs, PR 176, QRS 114, QT 390, QTC 402.  SECONDARY DIAGNOSES: 1. Coronary artery disease.     a.     S/P myocardial infarction with subsequent PCI to RCA in 2002      and 2006. 2. ICM, LVEF 30%.     a.     S/P Medtronic single chamber ICD. 3. Hypertension. 4. Hyperlipidemia. 5. Insulin-dependent diabetes mellitus.     a.     HbA1c 9.6% in this admission. 6. History of renal insufficiency (baseline creatinine 1.0). 7. History of kidney stones.  ALLERGIES AND INTOLERANCES:  PENICILLIN  (itching).  PROCEDURES: 1. Chest x-ray April 25, 2010:  No acute cardiopulmonary     abnormality. 2. EKG April 26, 2010:  Please see discharge diagnosis section #1. 3. EKG April 27, 2010:  No significant changes from prior tracing. 4. EKG April 28, 2010:  Please see discharge diagnosis section #1. 5. Medtronic ICD interrogation April 28, 2010:  Please see     discharge diagnosis section #1.  HISTORY OF PRESENT ILLNESS:  [name] is a 60 year old gentleman with the above-noted complex medical history who presented to [facility] ED after his ICD discharge.  The patient is followed by both Dr. Ferman Hamming and Dr. Lewayne Bunting at [facility].  He was recently seen by Dr. [name] earlier this month for the first time in awhile.  He had been tolerating his medications  well.  He was noted to have multiple episodes of VT, but they were treated successfully with antitachycardia pacing (ATP) and no changes were made.  However, yesterday while the patient was eating dinner, his defibrillator went off without warning and without antecedent symptoms.  The patient denied chest pain, shortness of breath, exercise intolerance, and no peripheral edema.  His ROS is broadly negative across all organ systems.  HOSPITAL COURSE:  The patient was admitted and had EP consult on date of admission.  It was determined the patient had recurrent ventricular tachycardia with failed all ATP and subsequent appropriate ICD discharge.  The patient was initiated on sotalol therapy at 120 mg p.o. q.12 h. and multiple EKG tracings showed stable QT/QTC.  The patient also had his device interrogated and had changes as outlined in discharge diagnosis section #1 completed by EP RN, Gypsy Balsam.  The patient was deemed stable for discharge by his primary cardiologist with the above med changes and ICD interrogation/changes.  He has been set up for followup to see his electrophysiologist, Dr.  Lewayne Bunting with an exercise treadmill study to rule out ischemia on May 13, 2010, at 9 a.m.  He will then follow up with his primary cardiologist, Dr. [name], on May 29, 2010, at 9 a.m.  At the time of discharge, the patient received his new medication list, prescriptions, followup instructions, and all questions and concerns were addressed prior to leaving hospital.  Note, the patient was also instructed not to drive for 6 months given his recent ICD discharge for ventricular tachycardia.  DISCHARGE LABS:  WBC 4.9, HGB 14.7, HCT 44.1, PLT count is 163, WBC differential was within normal limits on date of admission except for neutrophils of 42% and monocytes of 13%.  Sodium 140, potassium 4.5, chloride 105, bicarb 28, BUN 11, creatinine 0.98, glucose ranged from 86 to 273, calcium 9.4, magnesium 1.9, hemoglobin A1c 9.6%, mean plasma glucose 229.  Point-of-care markers negative x1 and 3 full sets of cardiac enzymes also negative.  FOLLOWUP PLANS AND APPOINTMENTS:  Please see hospital course.  DISCHARGE MEDICATIONS: 1. Magnesium oxide 400 mg 1 tablet p.o. daily. 2. Sotalol 120 mg 1 tablet p.o. q.12 h. 3. Toprol 100 mg 1 tablet p.o. daily. 4. Enteric-coated aspirin 325 mg 1 tablet p.o. nightly. 5. Cozaar/losartan 50 mg 1 tablet p.o. q.a.m. 6. Crestor 20 mg 1 tablet p.o. nightly. 7. Multivitamin 1 tablet p.o. q.a.m. 8. Novolin R 10 units subcu injection b.i.d. 9. NovoLog/insulin aspart 30 units subcu injection b.i.d.  DURATION OF DISCHARGE ENCOUNTER:  Including physician time was 35 minutes.     Jarrett Ables, PAC   ______________________________ [name]. [name], MD, [facility]    MS/MEDQ  D:  04/28/2010  T:  04/28/2010  Job:  [id number]  cc:   Doylene Canning. Ladona Ridgel, MD  Electronically Signed by Jarrett Ables PAC on 05/06/2010 81:19:14 PM Electronically Signed by Olga Millers MD [facility] on 05/09/2010 08:34:41 AM

## 2010-05-13 ENCOUNTER — Encounter: Payer: BC Managed Care – PPO | Admitting: Physician Assistant

## 2010-05-14 ENCOUNTER — Encounter (INDEPENDENT_AMBULATORY_CARE_PROVIDER_SITE_OTHER): Payer: BC Managed Care – PPO | Admitting: Physician Assistant

## 2010-05-14 ENCOUNTER — Encounter: Payer: Self-pay | Admitting: Physician Assistant

## 2010-05-14 DIAGNOSIS — I472 Ventricular tachycardia: Secondary | ICD-10-CM

## 2010-05-15 ENCOUNTER — Encounter: Payer: Self-pay | Admitting: Cardiology

## 2010-05-19 NOTE — H&P (Signed)
NAME:  [name] [name], [name] [name] NO.:  [id number]  MEDICAL RECORD NO.:  [id number]           PATIENT TYPE:  LOCATION:                                 FACILITY:  PHYSICIAN:  Karn Cassis, MD  DATE OF BIRTH:  23-Feb-1951  DATE OF ADMISSION:  04/26/2010 DATE OF DISCHARGE:                             HISTORY & PHYSICAL   PRIMARY CARDIOLOGIST:  [name]. [name], MD, [facility].  ELECTROPHYSIOLOGIST:  Doylene Canning. Ladona Ridgel, MD  CHIEF COMPLAINT:  "My ICD fired."  HISTORY OF PRESENT ILLNESS:  [name] [name] is a 60 year old male with history of coronary artery disease, ischemic cardiomyopathy, hypertension, diabetes, and hyperlipidemia, who comes to the hospital today after an ICD discharge.  [name] [name] history is significant for coronary artery disease status post PCI to the RCA in 2002 and 2006.  His last known ejection fraction was 30% any and he had a Medtronic single chamber ICD placed in December 2007.  Since his ICD was implanted, he has had a total of 3 shocks, most recent of which was in December 2011 and that was followed by an ICD shock this evening.  Dr. Ladona Ridgel reports that he was in his usual state of health, eating dinner at a barbecue restaurant when he had abrupt "electroshock feeling in the chest."  He had no prodrome or warning signs prior to the discharge.  Specifically, he denies any symptoms of angina, denies palpitations, denies lightheadedness.  When he arrived in the emergency department, the pacemaker was interrogated by a Medtronic representative who found 2 episodes of ventricular tachycardia one of which was treated by a shock.  The patient reports that he has been taking his medications regularly. He has not noticed any decline in his functional status.  He has also not noticed any symptoms of angina in the days and months leading up to today.  REVIEW OF SYSTEMS:  A 12-point review of systems is negative.  [name] [name] is a very active person.  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  PAST MEDICAL HISTORY: 1. Coronary artery disease status post MI, status post PCI to the RCA     in 2002 and 2006. 2. Ischemic cardiomyopathy with a most recent ejection fraction of     30%, status post Medtronic single chamber ICD placement. 3. Hypertension. 4. Hyperlipidemia. 5. Diabetes, type 2. 6. History of kidney stones. 7. History of chronic kidney disease.  SOCIAL HISTORY:  He lives in [place] with his wife and 3 children.  He works as a Chiropractor.  He has history of tobacco use, about 20 pack-year history, but quit in 2000.  He denies any history of alcohol abuse or illicit drug use.  FAMILY HISTORY:  His mother is alive and without any medical problems. His father unfortunately passed away, but did not have any history of heart disease.  MEDICATIONS:  Unfortunately, Dr. [name] does not recall his medications.  I was able to follow up most recent clinic note from April 04, 2010, and read out the medication that were outlined there, which he responds to as "sounds  good."  Specifically, his medications are 1. Aspirin 325 mg once a day. 2. Carvedilol 6.25 mg twice a day. 3. Cozaar 50 mg once a day. 4. Crestor 20 mg once a day. 5. Insulin 30 units subcu b.i.d. (this is the NPH formulation). 6. Aspart insulin 10 units subcu with meals.  PHYSICAL EXAMINATION:  VITAL SIGNS:  Upon arrival to the emergency department, his temperature was 97.6; blood pressure initially was 151/92, upon recheck was 125/76; his pulse is 72 beats a minute; respiratory rate 16; and saturating 100% on room air. GENERAL:  Well-appearing male, in no apparent distress. CARDIAC:  Regular rate and rhythm.  Normal S1, S2. LUNGS:  Clear to auscultation bilaterally. NECK:  His JVP is at 8-10 cm of water.  No carotid bruit. ABDOMEN:  Soft, nontender, and nondistended. EXTREMITIES:  No signs of  clubbing, cyanosis, or edema.  His feet are warm with palpable dorsalis pedis pulse. SKIN:  No rashes or lesions. PSYCH:  Alert and oriented to time, place, and person.  LABORATORY DATA:  Chemistries are unremarkable with except for a glucose of 273 (of note, he has not taken his insulin today).  CBC shows white count, hematocrit, and platelet count within normal limits.  His first set of cardiac markers are also within normal limits.  His chest x-ray shows no signs of acute cardiopulmonary abnormality.  His EKG shows normal sinus rhythm, old septal Q waves, old inferior Q waves, and PVCs.  ASSESSMENT AND PLAN:  In summary, this is a 60 year old African American male with history of coronary artery disease, New York Heart Association class II heart failure, most recent ejection fraction of 30%, status post implantable cardioverter-defibrillator, who is being admitted for 2 episodes of ventricular tachycardia status post direct current cardioversion. 1. Ventricular tachycardia:  To rule out ischemic etiology, we will     cycle cardiac markers.  We will also check for electrolytes and admit him to telemetry bed.       With regards to the treatment, I will start him on metoprolol 50 mg twice a day (as     opposed to his home dose of carvedilol 6.25 mg twice a day).  My     rationale for switching him from carvedilol to metoprolol is to     better suppress the PVC and VT burden.  If metoprolol fails,     amiodarone could potentially be added on, however, addition of     amiodarone may increase his defibrillation threshold.  I will defer     the decision on addition of amiodarone to the primary team in the     morning.  I think it would be reasonable approach to start off with     beta-blockers since he has only had a total of 3 DC cardioversions     since the ICD was implanted back in 2007. 2. History of coronary artery disease:  Continue aspirin, his home     dose of Crestor 20 mg,  and losartan 50 mg once a day. 3. Diabetes:  I will keep him on his home dose of NPH and aspart     combination as outlined above. 4. Code status:  He is full code.     Karn Cassis, MD     PV/MEDQ  D:  04/26/2010  T:  04/26/2010  Job:  [id number]  Electronically Signed by Karn Cassis MD on 05/19/2010 02:04:56 PM

## 2010-05-22 NOTE — Consult Note (Signed)
NAME:  TORREN, MAFFEO NO.:  [id number]  MEDICAL RECORD NO.:  [id number]           PATIENT TYPE:  LOCATION:                                 FACILITY:  PHYSICIAN:  Duke Salvia, MD, FACCDATE OF BIRTH:  1950-04-16  DATE OF CONSULTATION:  04/26/2010 DATE OF DISCHARGE:                                CONSULTATION   Thank you very much for asking [place] to see Mr. Hardgrove in consultation for ventricular tachycardia.  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  His coronary history goes back many years where he was found to have a prior inferior wall MI in 2002.  He presented acutely with an infarct in 2006 and underwent drug-eluting stenting of his LAD and POBA of his distal LAD.  The circumflex had modest disease.  His right was occludedas known previously.  His ejection fraction was depressed and he subsequently underwent ICD implantation in December 2007, receiving a single-chamber device.  His QRS is narrow.  He has had poor follow up.  He was seen by Dr. [name] earlier this month for the first time in a while.  He was tolerating his medications quite well.  He got back to see Dr. Ladona Ridgel who noted multiple episodes of ventricular tachycardia, but as they were largely treated successfully with antitachycardia pacing, elected to make no changes.  Yesterday while eating dinner, his defibrillator went off without warning and without antecedent symptoms.  The patient denies chest pain, shortness of breath, exercise intolerance, or peripheral edema.  His review of systems is broadly negative across all organ systems.  His past medical history is notable as above and further includes: 1. Hypertension. 2. Dyslipidemia. 3. Diabetes. 4. Nephrolithiasis. 5. Chronic renal  insufficiency.  SOCIAL HISTORY:  He lives in [place] with his wife.  He has 3 children.  He works as a Chiropractor.  He does not smoke.  He does not use alcohol or recreational drugs.  His family history is noncontributory for coronary disease.  His medications are supposed to include aspirin, carvedilol 6.25 b.i.d., Cozaar 50, Crestor 20, insulin.  On examination, he is an older [place] American male, appearing younger than his stated age of 58.  His blood pressure was 137/89.  His pulse was 97.5.  His pulse was 74.  He was in no acute distress.  His HEENT exam was normal.  His neck veins were 6 cm with positive HJR.  The carotids were brisk and full bilaterally without bruits.  Back was without kyphosis or scoliosis.  Lungs were clear.  Heart sounds were regular.  There was an S4.  The abdomen was soft with active bowel sounds without midline pulsation.  Femoral pulses were 2+.  Distal pulses were intact.  There was no clubbing, cyanosis, or edema. Neurological exam was grossly normal.  His skin was warm and dry.  Electrocardiogram dated last night demonstrated sinus rhythm at 77 with intervals of 0.23/0.10/0.36  with a QTc of 0.43.  There was poor R-wave progression with the first R-wave in V4.  There were occasional PVCs and also evidence of inferior wall Q-waves.  Laboratories are notable for potassium of 4.3, creatinine of 1.2 which translates to a GFR of 118.  His lipids demonstrated HDL of 34 and LDL of 97.  LFTs were normal.  Interrogation of his Medtronic device was reviewed.  There are multiple episodes of ventricular tachycardia and two things were notable, one that there were episodes of failure of ATP and secondly there was failure to detect because of ventricular tachycardia going below the detection rate.  IMPRESSION: 1. Ventricular tachycardia - recurrent with failed antitachycardia     pacemaker giving rise to shock and slowed ventricular tachycardia      below the detection rate. 2. Previous implantable cardioverter-defibrillator - Medtronic. 3. Ischemic heart disease with:     a.     Prior acute myocardial infarction/ischemic heart disease.     b.     Catheterization in 2006 as a rescue lytic with semi deployed      drug-eluting stent as well as distal plain old balloon      angioplasty.  Circumflex 70%.  Right coronary artery total.     c.     Myoview in 2010 demonstrates ejection fraction 27% with      prior anterior and inferior wall myocardial infarction. 4. Hyperlipidemia and diabetes.  DISCUSSION:  Mr. Fantroy has recurrent ventricular tachycardia.  Given the frequency of these episodes, I would recommend that we start sotalol for drug suppression.  His potassium is okay.  We will check his magnesium. We would anticipate him being in the hospital for 48 hours for initiation.  His heart rate is not too slow, so we will add this to his carvedilol.  Also, we will have to ask Dr. [name] to review his Plavix status. Given the fact that he has a first-generation drug-eluting stent deployed in the LAD in the setting of a STEMI there are concerns about long-term adequacy of healing and Plavix is recommended in this group according to my understanding.  In addition, we will need to program the ICD to increase his NID as well as to decrease his VT detection rate.  Because of the latter, we want to put him on a treadmill to make sure that there is no overlap between sinus rhythm and his detection rates, and he will need to refrain from driving x6 months.  Thank you for the consultation.     Duke Salvia, MD, [facility]     SCK/MEDQ  D:  04/26/2010  T:  04/26/2010  Job:  [id number]  Electronically Signed by Sherryl Manges MD [facility] on 05/22/2010 08:33:43 AM

## 2010-05-29 ENCOUNTER — Other Ambulatory Visit: Payer: Self-pay | Admitting: Cardiology

## 2010-05-29 ENCOUNTER — Encounter: Payer: Self-pay | Admitting: Cardiology

## 2010-05-29 ENCOUNTER — Ambulatory Visit (INDEPENDENT_AMBULATORY_CARE_PROVIDER_SITE_OTHER): Payer: BC Managed Care – PPO | Admitting: Cardiology

## 2010-05-29 DIAGNOSIS — I472 Ventricular tachycardia: Secondary | ICD-10-CM

## 2010-05-29 DIAGNOSIS — G459 Transient cerebral ischemic attack, unspecified: Secondary | ICD-10-CM

## 2010-05-29 DIAGNOSIS — I10 Essential (primary) hypertension: Secondary | ICD-10-CM

## 2010-05-29 DIAGNOSIS — I2510 Atherosclerotic heart disease of native coronary artery without angina pectoris: Secondary | ICD-10-CM

## 2010-05-29 DIAGNOSIS — E785 Hyperlipidemia, unspecified: Secondary | ICD-10-CM

## 2010-05-29 MED ORDER — MAGNESIUM OXIDE 400 MG PO TABS: 400.0000 mg | ORAL_TABLET | Freq: Every day | ORAL | Status: DC

## 2010-05-29 MED ORDER — METOPROLOL SUCCINATE ER 100 MG PO TB24: 100.0000 mg | ORAL_TABLET | Freq: Every day | ORAL | Status: DC

## 2010-05-29 MED ORDER — SOTALOL HCL 120 MG PO TABS: 120.0000 mg | ORAL_TABLET | Freq: Two times a day (BID) | ORAL | Status: DC

## 2010-05-29 MED ORDER — ROSUVASTATIN CALCIUM 40 MG PO TABS: 40.0000 mg | ORAL_TABLET | Freq: Every day | ORAL | Status: DC

## 2010-05-29 NOTE — Assessment & Plan Note (Signed)
Continue aspirin, beta blocker, ARB and statin.

## 2010-05-29 NOTE — Assessment & Plan Note (Signed)
Patient with intermittent numbness right upper extremity. No other neuro symptoms. Question TIA. Scheduled carotid Dopplers.

## 2010-05-29 NOTE — Progress Notes (Signed)
HPI: Pleasant gentleman with history of coronary disease who returns for followup. He has had previous PCI of his right coronary artery and his LAD. His last cardiac catheterization was performed on September 21, 2004 in the setting of an acute anterior myocardial infarction. His ejection fraction was 35-40%. He had a 99% mid LAD lesion as well as a 90% distal LAD lesion. There was a 70% mid to distal circumflex and the right coronary artery was totally occluded. The patient had PCI of his mid and distal  LAD at that time. The mid LAD lesion was treated with a drug eluding stent. He's also had a previous ICD. His last echocardiogram was performed on January 07, 2005. His ejection fraction was 30-40%. There was mild to moderate mitral regurgitation. Last Myoview was performed in October of 2010. Ejection fraction was 27%. There was a prior apical and inferior infarct but no ischemia. Admitted in February of 2012 with ICD discharges and ventricular tachycardia. Sotalol was added to his medical regimen. Since then, patient denies dyspnea, chest pain, pedal edema, ICD discharge or syncope. Mild nausea after AM dose of sotalol.  Current Outpatient Prescriptions  Medication Sig Dispense Refill  . aspirin 325 MG tablet Take 325 mg by mouth daily.        . insulin aspart (NOVOLOG) 100 UNIT/ML injection Inject 30 Units into the skin 2 (two) times daily.        . insulin regular (HUMULIN R,NOVOLIN R) 100 UNIT/ML injection Inject 10 Units into the skin 2 (two) times daily before a meal.        . losartan (COZAAR) 50 MG tablet Take 50 mg by mouth daily.        . magnesium oxide (MAG-OX) 400 MG tablet Take 400 mg by mouth daily.        . metoprolol (TOPROL-XL) 100 MG 24 hr tablet Take 100 mg by mouth daily.        . Multiple Vitamin (MULTIVITAMIN) tablet Take 1 tablet by mouth daily.        . rosuvastatin (CRESTOR) 20 MG tablet Take 20 mg by mouth daily.        . sotalol (BETAPACE) 120 MG tablet Take 120 mg by mouth 2  (two) times daily.        Marland Kitchen DISCONTD: carvedilol (COREG) 6.25 MG tablet Take 6.25 mg by mouth 2 (two) times daily with a meal.           Past Medical History  Diagnosis Date  . Renal insufficiency   . DM type 2 (diabetes mellitus, type 2)   . Ischemic cardiomyopathy   . Nephrolithiasis   . History of MI (myocardial infarction)   . HTN (hypertension)   . Hypercholesteremia   . Coronary atherosclerosis of unspecified type of vessel, native or graft   . Ventricular tachycardia     Past Surgical History  Procedure Date  . Ptca 09/01/00    x2  . Cardiac defibrillator placement     History   Social History  . Marital Status: Married    Spouse Name: N/A    Number of Children: 3  . Years of Education: N/A   Occupational History  . brick layer    Social History Main Topics  . Smoking status: Former Smoker    Quit date: 03/02/2000  . Smokeless tobacco: Not on file  . Alcohol Use: No  . Drug Use: No  . Sexually Active: Not on file   Other Topics Concern  .  Not on file   Social History Narrative  . No narrative on file    ROS:  Intermittent numbness in right upper extremity but no fevers or chills, productive cough, hemoptysis, dysphasia, odynophagia, melena, hematochezia, dysuria, hematuria, rash, seizure activity, orthopnea, PND, pedal edema, claudication. Remaining systems are negative.  Physical Exam: Well-developed well-nourished in no acute distress.  Skin is warm and dry.  HEENT is normal.  Neck is supple. No thyromegaly.  Chest is clear to auscultation with normal expansion. ICD left chest. Cardiovascular exam is regular rate and rhythm.  Abdominal exam nontender or distended. No masses palpated. Extremities show no edema. neuro grossly intact  ECG Sinus rhythm at a rate of 57. Frequent PVCs. Prior anterior infarct. Prior inferior infarct.

## 2010-05-29 NOTE — Assessment & Plan Note (Signed)
Management per electrophysiology. 

## 2010-05-29 NOTE — Assessment & Plan Note (Signed)
Increase Crestor 40 mg daily. Check lipids and liver in 6 weeks. Patient requests changing to generic Lipitor following that.

## 2010-05-29 NOTE — Assessment & Plan Note (Signed)
Patient has had no further discharge of ICD. Continue sotalol. He has mild nausea following a.m. Dose. If this worsens we may need to change antiarrhythmics.

## 2010-05-29 NOTE — Assessment & Plan Note (Signed)
Blood pressure controlled on present medications. Will continue. 

## 2010-05-29 NOTE — Assessment & Plan Note (Signed)
Continue present medications. Check potassium and renal function in 6 weeks. 

## 2010-05-29 NOTE — Patient Instructions (Signed)
Your physician recommends that you schedule a follow-up appointment in 3 months with Dr. [name]. Your physician has requested that you have a carotid duplex. This test is an ultrasound of the carotid arteries in your neck. It looks at blood flow through these arteries that supply the brain with blood. Allow one hour for this exam. There are no restrictions or special instructions.  Your physician recommends that you return for a FASTING lipid profile in 4 weeks.  Your physician has recommended you make the following change in your medication: INCREASE CRESTOR to 40mg  by mouth daily. Please call us when you are finished with your Crestor to talk about switching to Lipitor.

## 2010-06-11 ENCOUNTER — Encounter (INDEPENDENT_AMBULATORY_CARE_PROVIDER_SITE_OTHER): Payer: BC Managed Care – PPO | Admitting: *Deleted

## 2010-06-11 DIAGNOSIS — G459 Transient cerebral ischemic attack, unspecified: Secondary | ICD-10-CM

## 2010-06-11 DIAGNOSIS — R209 Unspecified disturbances of skin sensation: Secondary | ICD-10-CM

## 2010-06-11 DIAGNOSIS — I6529 Occlusion and stenosis of unspecified carotid artery: Secondary | ICD-10-CM

## 2010-06-16 ENCOUNTER — Encounter: Payer: Self-pay | Admitting: Cardiology

## 2010-06-30 ENCOUNTER — Other Ambulatory Visit (INDEPENDENT_AMBULATORY_CARE_PROVIDER_SITE_OTHER): Payer: BC Managed Care – PPO | Admitting: *Deleted

## 2010-06-30 ENCOUNTER — Encounter: Payer: Self-pay | Admitting: *Deleted

## 2010-06-30 DIAGNOSIS — E785 Hyperlipidemia, unspecified: Secondary | ICD-10-CM

## 2010-06-30 DIAGNOSIS — I10 Essential (primary) hypertension: Secondary | ICD-10-CM

## 2010-06-30 LAB — BASIC METABOLIC PANEL
CO2: 25 mEq/L (ref 19–32)
Chloride: 103 mEq/L (ref 96–112)
Potassium: 4.8 mEq/L (ref 3.5–5.1)
Sodium: 135 mEq/L (ref 135–145)

## 2010-06-30 LAB — HEPATIC FUNCTION PANEL
ALT: 17 U/L (ref 0–53)
AST: 17 U/L (ref 0–37)
Bilirubin, Direct: 0 mg/dL (ref 0.0–0.3)
Total Bilirubin: 0.6 mg/dL (ref 0.3–1.2)
Total Protein: 7 g/dL (ref 6.0–8.3)

## 2010-06-30 LAB — MAGNESIUM: Magnesium: 1.9 mg/dL (ref 1.5–2.5)

## 2010-06-30 LAB — LIPID PANEL
LDL Cholesterol: 84 mg/dL (ref 0–99)
Total CHOL/HDL Ratio: 4

## 2010-07-18 NOTE — Assessment & Plan Note (Signed)
[facility]                              CARDIOLOGY OFFICE NOTE   Omega, Slager DEANTA MINCEY                    MRN:          [id number]  DATE:11/16/2005                            DOB:          [birth year]    Mr. Cowher is a pleasant gentleman who has a history of coronary disease.  Since I last saw him, he denies any dyspnea, chest pain, palpitations, or  syncope, and there is no pedal edema.   MEDICATIONS:  Include  1. Aspirin 325 mg p.o. q. day.  2. Plavix 75 mg p.o. q. day.  3. Glipizide 5 mg p.o. q. day.  4. Cozaar 25 mg p.o. q. day.  5. Niaspan 1 g p.o. q. day.  6. Toprol 100 mg p.o. q. day.  7. Insulin.   PHYSICAL EXAMINATION:  GENERAL:  Shows a blood pressure of 143/87.  His  pulse is 64.  NECK:  Supple with no bruits.  CHEST:  His chest is clear.  CARDIOVASCULAR:  Reveals a regular rate.  ABDOMEN:  Shows no pulsatile masses.  No bruits.  EXTREMITIES:  Show no edema.   His electrocardiogram shows a sinus rhythm at a rate of 62.  There is  evidence of a prior anterior and inferior infarct.  There is inferolateral T-  wave inversion.   DIAGNOSES:  1. Coronary artery disease.  2. History of angiotensin-converting enzyme inhibitor intolerance.  3. Hyperlipidemia.  4. Hypertension.  5. Diabetes mellitus.  6. History of renal insufficiency following catheterization in 2002,      possibly secondary to previous angiotensin-converting enzyme inhibitor      use.  7. History of nephrolithiasis.   PLAN:  Mr. Markowicz is doing well from a symptomatic standpoint.  However, his  most recent echocardiogram showed an ejection fraction of 30% to 40%.  We  will schedule him to have a MUGA and, if he has an ejection fraction of 35%  or less, then he will be referred for an ICD.  I have asked him to increase  his Cozaar to 50 mg p.o. q.day and we will check a BMET in 1 week to follow  his potassium and renal function.  He will resume his Vytorin (he  discontinued this for  unclear reasons).  We will check lipids and liver in 6 weeks and adjust with  goal LDL of less than 70.  I will see him back in 6 to 8 months.                              [name] [name], MD, [facility]    BSC/MedQ  DD:  11/16/2005  DT:  11/16/2005  Job #:  [id number]   cc:   Dr. Aida Puffer

## 2010-07-18 NOTE — Discharge Summary (Signed)
[place]  Patient:    [name] [name], [name] [name]                      MRN: [id number] Adm. Date:  [id number] Disc. Date: 08/23/00 Attending:  Colon Branch [name]:   [name] [name], P.A.C. [facility]                  Referring Physician Discharge [place]  DISCHARGE DIAGNOSES: 1. Coronary disease, right coronary artery stent in January of 2002 with redo    this admission followed by abrupt closure of the right coronary artery, to    be treated medically. 2. Treated hyperlipidemia. 3. Treated hypertension.  HISTORY OF PRESENT ILLNESS:  The patient is a 60 year old male followed by [name] Frieze. [name], M.D., who had an RCA stent placed in January of 2002.  He presented on August 18, 2000, with chest pain consistent with unstable angina.  HOSPITAL COURSE:  He was started on heparin and nitrates.  Enzymes were obtained and these were positive with a CK of 441 with 25 MB.  Catheterization was done by [name] [name], M.D., that day which revealed a 95% proximal RCA, total mid RCA, and 95% distal RCA.  All three sites were dilated and three stents were placed.  He had some residual distal circumflex disease and no significant stenosis, but some 40% narrowing in the LAD.  His EF was normal. Post procedure he was put on Integrilin for 48 hours.  He had some recurrent chest pain on August 19, 2000, with ST elevation.  He was taken back to the catheterization lab by [name] [name], M.D.  The proximal RCA had occluded. [name] [name], M.D., felt there was little chance of long-term success and the plan was for medical therapy.  The patient was transferred back to the CCU and followed closely.  He was transferred to the floor and ambulated.  We feel that he can be discharged on August 23, 2000.  We decided not to send him home on Plavix as his RCA is essentially occluded.  He did have a low-grade temperature at discharge, but UA and CBC were within normal limits.  DISCHARGE  MEDICATIONS: 1. Toprol XL 50 mg a day. 2. Altace 5 mg a day. 3. Lipitor 40 mg a day. 4. Coated aspirin q.d.  LABORATORY DATA:  White count 7.9, hemoglobin 12.6, hematocrit 37.4, platelets 225.  The INR was 1.1.  Sodium 136, potassium 3.8, BUN 9, creatinine 1.3.  The CKs peaked at 2369 and 173 MBs.  The lipid profile showed an LDL of 86 and an HDL of 34.  The chest x-ray showed cardiomegaly and no acute disease.  The EKG showed sinus rhythm, left axis deviation, and inferolateral infarct which appears to be old.  DISPOSITION:  The patient is discharged in stable condition.  FOLLOW-UP:  He will follow up with [name] Frieze. [name] [name], M.D., and P.A. on September 13, 2000, at 11 a.m. DD:  08/23/00 TD:  08/23/00 Job: [id number]

## 2010-07-18 NOTE — H&P (Signed)
[place]. [facility]  Patient:    [name] [name], [name] [name]                      MRN: [id number] Adm. Date:  [id number] Attending:  Tobin Chad Dictator:   Dorcas Mcmurray, M.D.                         History and Physical  PROBLEMS: 1. Ileus. 2. Constipation. 3. Coronary artery disease, status post myocardial infarction within    the past few days, status post PTCA x 2 with reocclusion of the RCA,    plan for medical management. 4. Hypercholesterolemia. 5. Hypertension. 6. History of nephrolithiasis. 7. Increasing creatinine, baseline 1.1 to 1.3, now 2.0.  HISTORY OF PRESENT ILLNESS:  This is a 60 year old male who was recently discharged from the hospital (yesterday), secondary to an acute myocardial infarction, and he is now back with nausea and vomiting, and abdominal pain. During his previous hospitalization he was found to have an occluded RCA, which was stented, but then rapidly reoccluded.  The plan was then to pursue medical management only.  He reports after discharge yesterday he developed some nausea, and then began vomiting whatever he ate.  This was nonbilious, nonfecal, and nonbloody.  This went on for several times.  He has had no vomiting today, but has not eaten anything today.  He reports that the pain is diffuse throughout the abdomen.  It is unrelated to movement.  It radiates to bilateral lateral quadrants, left greater than right.  His last bowel movement was seven days ago.  Typically he has an every-day bowel movement.  He reports the pain is not typical of kidney stones for him.  He is also not passing any gas.  The patient denies any fevers, chills, diarrhea, shortness of breath, chest pain, headache, neurologic deficits, or diaphoresis.  PAST MEDICAL HISTORY:  See the HPI.  CURRENT MEDICATIONS: 1. Toprol XL 50 mg q.d. 2. Altace 5 mg q.d. 3. Enteric-coated aspirin 325 mg q.d. 4. Lipitor 40 mg q.d.  ALLERGIES:   PENICILLIN.  SOCIAL HISTORY:  He is married.  He has three kids.  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  He has approximately a 20-pack-year-history of tobacco.  He quit in January 2002.  He does not use any alcohol or drugs.  FAMILY HISTORY:  Denies any medical problems in his family.  REVIEW OF SYSTEMS:  See the HPI.  He reports that he typically drinks lots of fluids a day, including water, juice, and soda, etc.  PHYSICAL EXAMINATION:  VITAL SIGNS:  Blood pressure 134/84, pulse 56, respirations 20, saturation 96% on room air.  GENERAL:  This is a pleasant African-American male in no apparent distress, alert and oriented x 4.  HEENT:  PERRL.  EOMI.  Oropharynx shows poor dentition.  He has arcus senilis.  NECK:  No jugular venous distention.  LUNGS:  Completely clear to auscultation bilaterally.  No wheezes, rhonchi, or rales.  HEART:  Regular rate and rhythm.  ABDOMEN:  Soft, mildly diffuse tenderness to palpation.  Mild fullness.  He has a few bowel sounds.  It is nondistended.  He has no CVA tenderness.  No peritoneal signs.  No rebound.  No hepatosplenomegaly.  RECTAL:  Heme-negative.  A hard stool in the vault per the emergency room physician.  EXTREMITIES:  No cyanosis, clubbing, or edema.  The urinalysis with specific gravity of 1.022,  otherwise negative.  Hemoglobin 14, hematocrit 40.  Sodium 138, potassium 4.7, chloride 102, bicarbonate 31, BUN 12, creatinine 2.0.  Electrocardiogram:  Low voltage, poor R-wave progression.  He has Qs in lead III and aVF, which is no change from previously.  Chest x-ray shows streaky atelectasis in the bilateral bases.  Poor inspiratory effort.  Abdominal series shows dilated transverse colon and proximal small bowel, some bowel wall thickening in the proximal jejunum and an approximately 6.0 mm left renal stone.  ASSESSMENT/PLAN:  This is a 60 year old male with coronary artery disease and recent myocardial  infarction, now with constipation for seven days and evidence of an ileus, renal stone and probably bowel edema on abdominal x-ray.  PLAN: 1. GI:  Suspect constipation is secondary to narcotics that he received as    an inpatient.  (The patient received greater than 15 doses during this    admission).  This is a new problem, and could be the etiology of all of    his symptoms.  We also need to consider other things such as an ileus,    secondary to nephrolithiasis, ischemic bowel/cholesterol emboli (quite    unlikely secondary to absence of the hallmark sign of severe pain, and    is unimpressive on examination).  Radiology also raised the question of    pancreatitis.  Will give IV fluids and check a CMET, amylase, and lipase.    Start him on a bowel regimen starting from below.  Consider a CT to further    differentiate calculi, bowel wall edema, etc.  Keep him n.p.o. 2. Increased creatinine:  Will treat this with IV fluids.  No IV contrast    secondary to above.  His baseline creatinine is 1.1 to 1.3.  Will continue    to follow this.  Also hold Altace. 3. Cardiac:  Will hold his Altace secondary to number two.  Continue his    beta blocker.  Restart his Lipitor when he is eating.  Put him on    telemetry, secondary to the recent myocardial infarction and his increased    risks for dysrhythmias. 4. He does need a primary M.D. DD:  08/24/00 TD:  08/24/00 Job: [id number]

## 2010-07-18 NOTE — Assessment & Plan Note (Signed)
[facility]                           ELECTROPHYSIOLOGY OFFICE NOTE   Valor, Quaintance KAL CHAIT                    MRN:          [id number]  DATE:01/04/2006                            DOB:          11/08/1950    Mr. Klutz is referred today by Dr. Olga Millers for consideration for  prophylactic ICD implantation.   HISTORY OF PRESENT ILLNESS:  The patient is a very pleasant middle-aged man  with a history of diabetes, hypertension, dyslipidemia, as well as coronary  artery disease status post myocardial infarction initially in 2005 and  subsequently in 2006.  The patient is a Scientist, water quality by trade who denies  chest pain or shortness of breath and is able to do whatever he wants  without limitation.  He denies syncope or peripheral edema.  His most recent  percutaneous coronary intervention was performed back in July 2006.  At that  time, his mid LAD was treated from a 99% stenosis to essentially no residual  stenosis utilizing a Taxus drug-eluting stent.  Since his intervention he  has done well from a chest pain perspective but does have the recent known  LV dysfunction with an EF of 30% by MUGA.   PAST MEDICAL HISTORY:  Is as noted above.  He also has history of obesity.   FAMILY HISTORY:  Notable for both parents having complications from  diabetes.   SOCIAL HISTORY:  The patient is a self-employed Scientist, water quality.  He has a  history of tobacco use but stopped smoking 8 years ago; prior to this,  smoked one pack a day most of his adult life (i.e. 30 years).  He uses  occasional alcoholic beverages but has not had any alcohol since his MI a  year ago.   REVIEW OF SYSTEMS:  Negative except for problems with his diabetes including  polyuria and polydipsia.  The rest of review of systems were reviewed and  found to be negative.   PHYSICAL EXAMINATION:  GENERAL:  He is a pleasant, well-appearing, middle-  aged man in no distress.  VITAL SIGNS:   Blood pressure 138/94, the pulse was 68 and regular,  respirations were 18, the weight was 268 pounds.  HEENT:  Normocephalic and atraumatic.  Pupils equal and round, the  oropharynx moist, sclerae anicteric.  NECK:  Revealed no jugular venous distention.  There is no thyromegaly,  trachea is midline.  The carotids are 2+ and symmetric.  LUNGS:  Clear bilaterally to auscultation.  There are no wheezes, rales, or  rhonchi.  No increased work of breathing.  CARDIOVASCULAR:  Revealed a regular rate and rhythm with normal S1 and S2.  There is a soft S4 gallop present.  The PMI was enlarged and laterally  displaced.  ABDOMEN:  Obese, nontender, nondistended.  There was no organomegaly.  The  bowel sounds are present, no rebound or guarding.  EXTREMITIES:  Demonstrated no cyanosis, clubbing or edema.  The pulses were  2+ and symmetric.  NEUROLOGIC:  Alert and oriented x3 with cranial nerves intact.  The strength  was 5/5 and symmetric.   The EKG demonstrates sinus  rhythm with first-degree AV block, prior inferior  and anterior MIs.   MEDICATIONS:  Include insulin, glipizide, aspirin, Plavix, Cozaar 50 daily,  Niaspan, and Toprol-XL 100 a day.   IMPRESSION:  1. Ischemic cardiomyopathy.  2. Class I heart failure.  3. Diabetes.  4. Hypertension.  5. Dyslipidemia.   DISCUSSION:  With his EF of 30% and multiple MIs in the past, he is a  candidate for prophylactic ICD insertion (MADIT II).  I have recommended  proceeding with ICD implantation.  He is considering his options and will  call us if he would like to proceed with ICD implant.    ______________________________  Doylene Canning. Ladona Ridgel, MD    GWT/MedQ  DD: 01/04/2006  DT: 01/04/2006  Job #: [id number]   cc:   Aida Puffer, M.D.

## 2010-07-18 NOTE — Assessment & Plan Note (Signed)
[facility]                         ELECTROPHYSIOLOGY OFFICE NOTE   Deontra, Pereyra CLOYS VERA                    MRN:          [id number]  DATE:05/12/2006                            DOB:          [birth year]    Mr. Couts returns today for followup.  He is a pleasant man with the  history of coronary artery disease and an ischemic cardiomyopathy with  Class II heart failure and an EF of 30%, who underwent ICD implantation  back in December.  He returns today for followup.  Overall, he feels  well.  He denies chest pain or shortness of breath.  He has gone back to  work and had no specific complaints, except he does have a little  itchiness at his insertion site.   ON EXAM:  He is a pleasant, well-appearing man in no distress.  Blood pressure 146/90, the pulse 83 and regular, the respirations were  18, the weight was 286 pounds.  NECK:  Revealed no jugular venous distention.  LUNGS:  Clear bilaterally to auscultation, no wheezing, rales or  rhonchi.  CARDIOVASCULAR EXAM:  Revealed a regular rate and rhythm with normal S1  and S2.  EXTREMITIES:  Demonstrated no cyanosis, clubbing or edema.  His ICD  insertion site was healed nicely.   Interrogation of his defibrillator demonstrates a Medtronic Maximo with  R-waves of 17, the impedance 456 ohms, the threshold of 0.3 and  impedance of 57 ohms.  There were no intercurrent ICD therapies.   IMPRESSION:  1. Ischemic cardiomyopathy.  2. Congestive heart failure.  3. Status post ICD insertion.   DISCUSSION:  Overall, Mr. Canal is stable and his defibrillator is  working normally.  We will see him back in the office in December of  2008.  He will follow up in __________     Doylene Canning. Ladona Ridgel, MD  Electronically Signed    GWT/MedQ  DD: 05/12/2006  DT: 05/14/2006  Job #: [id number]

## 2010-07-18 NOTE — Discharge Summary (Signed)
[place]. [facility]  Patient:    [name] [name], [name] [name]                      MRN: [id number] Adm. Date:  [id number] Disc. Date: 08/25/00 Attending:  Tobin Chad Dictator:   Kevin Fenton, M.D.                           Discharge Summary  DATE OF BIRTH:  19-Feb-1951.  DISCHARGE MEDICATIONS: 1. Toprol XL 50 mg p.o. q.d. 2. Aspirin 325 mg p.o. q.d. 3. Lipitor 40 mg p.o. q.d. 4. MiraLax one capsule in 8 ounces of water once a day for a week and    then Colace 100 mg once a day. 5. The patient is not to take his Altace.  DIET:  He is to follow a low fat, low salt diet.  FOLLOW-UP:  He is to follow up with [facility] as scheduled with them and is to see me, Kevin Fenton, M.D., at the [facility] on Wednesday, September 01, 2000, at 2 oclock.  DISCHARGE DIAGNOSES: 1. Ileus resolved. 2. Constipation likely secondary to narcotics. 3. Coronary artery disease status post myocardial infarction within the past    few days, status post percutaneous transluminal coronary angioplasty x 2    with reocclusion of right coronary artery.  Plan for medical management. 4. Hypercholesterolemia. 5. Hypertension. 6. Nephrolithiasis. 7. Increasing creatinine, baseline 1.1, at admission 2.0.  BRIEF HOSPITAL COURSE:  This is a 60 year old African-American male discharged from the hospital the day prior to admission secondary to acute MI but now returned with nausea, vomiting, and abdominal pain.  After discharge from the hospital he had developed some nausea and then vomiting ___________ nonbilious, nonfecal, nonbloody for several times.  He also complained of some abdominal pain that radiated bilaterally to the lower quadrants left greater than right.  He had a bowel movement, last time seven days ago and usually is an everyday regimen.  He was not passing any gas.  Acute abdominal series shows some dilated transverse colon and proximal  small-bowel with some bowel wall thickening in the proximal jejunum and approximately 6 mm left renal stone.  The patient was admitted, made NPO and given Fleets enemas x 2.  These provided him with four bowel movements as well as really from his pain.  He also complained of having some hunger and he was no longer nauseous or vomiting.  The patients repeat abdominal series the next morning showed an improvement in the bowel gas pattern. The patients abdominal exam remained benign. The patients diet was advanced and tolerated well.  He was discharged on MiraLax for a week to finish cleaning out the bowel from stool and then Colace q.d.  INCREASED CREATININE:  We gave him some IV rehydration as well as stopped his Altace.  His creatinine at discharge was 1.6.  Will continue to follow this as an outpatient.  If his creatinine is back to baseline, will try to restart Altace slowly.  CARDIAC:  He remained stable during this admission and [facility] was consulted. DD:  08/25/00 TD:  08/25/00 Job: [id number]

## 2010-07-18 NOTE — Op Note (Signed)
NAME:  [name] [name], MCFARREN NO.:  [id number]   MEDICAL RECORD NO.:  [id number]          PATIENT TYPE:  INP   LOCATION:  2852                         FACILITY:  [facility]   PHYSICIAN:  Doylene Canning. Ladona Ridgel, MD    DATE OF BIRTH:  [birth year]   DATE OF PROCEDURE:  02/17/2006  DATE OF DISCHARGE:                               OPERATIVE REPORT   PROCEDURE PERFORMED:  Implantation of a single-chamber implantable  cardioverter-defibrillator.   INDICATIONS:  Ischemic cardiomyopathy, class II heart failure, EF of  30%.   INTRODUCTION:  The patient is a 59 year old male with a history of  ischemic cardiomyopathy status post MI, with a history of congestive  heart failure, who is now referred for prophylactic ICD implantation.   PROCEDURE:  After informed consent was obtained, the patient was taken  to the diagnostic EP lab in a fasting state.  After the usual  preparation and draping, intravenous fentanyl and midazolam were given  for sedation.  Lidocaine 30 mL was infiltrated into the left  infraclavicular region.  A 7 cm incision was carried out over this  region and electrocautery utilized to dissect down to the cephalic vein.  The Medtronic Sprint Quattro Secure, serial number [id number], was  advanced by way of the cephalic vein into the right ventricle.  Mapping  was carried out and the R-waves measured 10 mV, the impedance 840 ohms  with the lead actively fixed and a threshold of 0.6 volts at 0.5 msec.  Ten-volt pacing did not stimulate the diaphragm.  With the defibrillator  lead in satisfactory position and actively fixed, it was secured to the  subpectoralis fascia with a figure-of-eight silk suture.  The sewing  sleeve was also secured with silk suture.  Electrocautery was utilized  to make a subcutaneous pocket.  Kanamycin irrigation was utilized to  irrigate the pocket and electrocautery utilized to assure hemostasis.  The Medtronic Maximo VR, serial number [id number], was  connected to the  defibrillator lead and placed in the subcutaneous pocket.  The generator  was secured with silk suture.  Additional kanamycin was utilized to  irrigate the pocket and defibrillation threshold testing carried out.   After the patient was more deeply sedated with fentanyl and Versed, VF  was induced with a T-wave shock.  A 15-joule shock was delivered and VF  was terminated and sinus rhythm restored.  Five minutes was allowed to  elapse and a second defibrillation threshold test carried out.   After the patient was more deeply sedated with fentanyl and Versed, VF  was again induced with a T-wave shock and again a 15-joule shock was  delivered, which terminated VF and restored sinus rhythm.  At this point  no additional defibrillation threshold testing was carried out and the  incision closed with a layer of 2-0 Vicryl, followed by a layer of 3-0  Vicryl, followed by a layer of 4-0 Vicryl.  Benzoin was painted on the  skin, Steri-Strips were applied, and pressure dressing was placed and  the patient was returned to his room in satisfactory condition.   COMPLICATIONS:  There were no  immediate procedure complications.   RESULTS:  This demonstrates successful implantation of a Medtronic  single-chamber defibrillator a patient with ischemic cardiomyopathy and  congestive heart failure, EF 30% despite maximal medical therapy.      Doylene Canning. Ladona Ridgel, MD  Electronically Signed     GWT/MEDQ  D:  02/17/2006  T:  02/17/2006  Job:  [id number]   cc:   [name]. [name], MD, [facility]  Aida Puffer

## 2010-07-18 NOTE — Cardiovascular Report (Signed)
[place]. [facility]  Patient:    [name] [name], [name] [name]                      MRN: [id number] Proc. Date: 08/19/00 Adm. Date:  [id number] Attending:  Colon Branch CC:         Daisey Must, M.D. [facility]  [name]. [name], M.D. [facility]   Cardiac Catheterization  PROCEDURE: 1. Left heart catheterization 2. Percutaneous transluminal coronary angioplasty of the right coronary    artery. 3. Mechanical thrombolysis with AngioJet of the right coronary artery.  DIAGNOSES: 1. Severe single-vessel coronary artery disease. 2. Status post recent inferior wall myocardial infarction. 3. 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 4. Mild left ventricular systolic dysfunction.  CARDIOLOGIST:  Veneda Melter, M.D.  HISTORY:  [name] [name] is a 60 year old black gentleman with a known history of coronary artery disease who has previously undergone percutaneous intervention of the right coronary artery for inferior wall myocardial infarction on March 31, 2000.  The patient presented recently with recurrent chest discomfort and was found to have in-stent restenosis and thrombosis requiring urgent revascularization.  This was performed on June 19.  At that time, he was found to have mild LV dysfunction with inferior wall hypokinesis.  The patient had successful recanalization of the right coronary artery with AngioJet thrombectomy and placement of three additional stents.  He had TIMI-3 flow.  Overnight, the patient has had recurrent chest discomfort with ST elevation in leads V1 through V3 consistent with acute thrombosis and ischemia of the inferior wall.  He presents now for cardiac catheterization.  TECHNIQUE:  After informed consent was obtained, the patient was taken to the catheterization lab urgently.  A 7-French sheath was placed in the right femoral artery.  The patient had previously been on Integrilin, aspirin, and Plavix.  He was later supplemented with  heparin to maintain his ACT to 300 seconds.  Selective coronary angiography was then performed using preformed 6-French Judkins catheters.  This showed the left main trunk to be patent.  The LAD was patent was well with diffuse disease in the mid section of 50% as previously described.  The left circumflex artery was also patent with two marginal branches and moderate diffuse disease as previously noted of 40 to 50%.  The right coronary artery was a dominant vessel, large caliber.  It was occluded proximally.  The posterior descending artery and two posterior ventricular branches were seen distally to fill the collaterals from the left coronary system.  These were underfilled.  There was evidence of significant thrombus burden.  A 7-French JR4 guide catheter was then used to engage the right coronary artery.  Multiple attempts were then made to probe and recannulate the vessel using balanced middle-weight as well as a Luge wire.  We were finally successful using a 3.0 x 20 mm over-the-wire Maverick balloon for support and the Luge wire and crossing the proximal occlusion.  The balloon was advanced in the distal vessel.  Manual injection of contrast in the lumen confirmed positioning within the true lumen of the RCA.  The Maverick balloon was then brought back and single inflation performed in the mid section of the right coronary artery in the area of stenting at 6 atmospheres for 15 seconds. Repeat angiography showed recanalization of the vessel with flow through the proximal, mid, and distal RCA extending into a posterior ventricular branch. This was TIMI-1.  There was severe diffuse disease along the entire course of the  right coronary artery consistent with thrombus.  At this point, the AngioJet catheter was introduced and several passes made through the proximal and mid right coronary artery at 90 and then 70 seconds with repeat angiography showing moderate improvement in vessel  lumen and extraction of thrombus; however, there was still significant resistance to distal flow.  The AngioJet catheter would not take the distal bend of the artery which was heavily stented.  There appeared to be a severe stenosis in the distal posterior ventricular branch perhaps compromising flow, and a 2.5 x 20 mm Maverick balloon was introduced.  Three inflations were performed in the distal RCA at the takeoff of the posterior ventricular branch at up to 6 atmospheres for 30 seconds.  Repeat angiography did show improvement in flow into the middle posterior ventricular branch.  However, there was no flow to the PDA or the distal posterior ventricular branch.  Due to the extensive amount of thrombus burden and resistance to flow, it was felt that further percutaneous intervention was unlikely to be successful with a high risk of reocclusion due to slow flow in the distal vasculature.  Thus, we elected to proceed with medical therapy.  The equipment and guide catheter were then removed and the sheath secured into position.  The patient tolerated the procedure well and was transferred to the CCU in stable condition.  HEMODYNAMIC DATA:  Aortic pressure 124/84.  FINAL RESULT:  Unsuccessful recanalization of subacute thrombosis of the right coronary artery status post delayed presentation of inferior wall myocardial infarction.  ASSESSMENT/PLAN:  [name] [name] is a 60 year old gentleman with advanced single-vessel coronary artery disease.  The patient will be transferred to the CCU and treated medically for his evolving inferior wall myocardial infarction.  Due to noncritical disease in the left coronary system as well as extensive damage to the distal vasculature, surgical revascularization would be unlikely to provide benefit for this patient. DD:  08/19/00 TD:  08/19/00 Job: [id number]

## 2010-07-18 NOTE — Procedures (Signed)
[facility]                               NUCLEAR IMAGING STUDY   EDWARDS, MCKELVIE                      MRN:          [id number]  DATE:11/30/2005                            DOB:          12-15-1950    RESTING MUGA SCAN   [name] [name] is a 60 year old patient of Dr. Olga Millers.  He has a history  of ischemic cardiomyopathy with previous anterior and inferior wall  myocardial infarctions.  MUGA scan was done to assess quantitative EF to see  if the patient was a candidate for AICD.   PROTOCOL:  The patient's red blood cells were tagged using technetium-69m  sodium pertechnetate.   Images were reconstructed in the anterior, best septal and lateral views.  There was a moderate left ventricular cavity dilatation.  There was  hypokinesis of the entire septum as well as the inferobasal wall.  Quantitative ejection fraction was 30%.   IMPRESSION:  Abnormal MUGA scan with hypokinesis of the entire septum and  inferior basal wall, quantitaive ejection fraction 30%.  Based on these  results, the patient would appear to be a candidate for an AICD.            ______________________________  Noralyn Pick. Eden Emms, MD, [facility]      PCN/MedQ  DD:  12/01/2005  DT:  12/02/2005  Job #:  [id number]

## 2010-07-18 NOTE — Discharge Summary (Signed)
[place]  Patient:    [name] [name], [name] [name]                      MRN: [id number] Adm. Date:  03/31/00 Disc. Date: 04/02/00 Attending:  Noralyn Pick. Eden Emms, M.D. [facility] Dictator:   Gene Serpe, P.A.                  Referring Physician Discharge [place]  PROCEDURES:  Stent of right coronary artery on March 31, 2000.  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  He presented with new onset chest pain to the [facility] Emergency Room.  LABORATORY DATA:  Cardiac enzymes:  Peak CPK 1010/121.6 (12%), peak troponin I 35.3.  Myoglobin pending.  Potassium and renal function normal. Low albumin 2.7.  Elevated AST to 99.  Otherwise normal LFTs.  INR 1.2. ESR 15.  WBC 6.5, HGB 11.8, HCT 36.2, and platelets 207 at discharge.  Normal metabolic profile at discharge.  Lipid profile pending.  Admission CXR:  NAD.  HOSPITAL COURSE:  The patient initially reported relief with Toradol while in the emergency room and did not respond to nitroglycerin.  The chest pain was considered atypical and initial plans were to proceed with non-invasive work-up.  However, cardiac enzymes were abnormal and the patient did in fact rule in for myocardial infarction.  He also subsequently developed marked sinus bradycardia with symmetric T-wave inversion.  Beta blockers were deferred secondary to the bradycardia.  Arrangements were made to proceed with coronary angiography which was performed on March 31, 2000, by Rollene Rotunda, M.D. (see catheterization report for full details).  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  Diffuse nonobstructive disease was noted of the CFX.  LV function was normal with inferobasal HK.  The  patient underwent subsequent successful stenting of the total mid RCA with no noted complications.  He was maintained on Integrilin x 48 hours post procedure with plans to continue Plavix x 1 month.  The patient will need a follow-up lipid/liver profile in six weeks given initiation of statin.  Of note, he was not on any medications prior to admission.  MEDICATIONS AT DISCHARGE: 1. Plavix 75 mg q.d. (x 4 weeks). 2. Coated aspirin 325 mg q.d. 3. Altace 2.5 mg q.d. 4. Zocor 40 mg q.d. 5. Nitrostat as directed.  ACTIVITY:  The patient is to refrain from any strenuous activity/driving x 2 weeks.  He is not to return to work until follows up with [place] in the office.  DIET:  He is to maintain a low-fat/cholesterol diet.  SPECIAL INSTRUCTIONS:  He is to discontinue smoking tobacco.  FOLLOW-UP:  The patient is scheduled to return to [name] on April 14, 2000, at 11 a.m.  He will then follow up with Dr. [name] in three months.  DISCHARGE DIAGNOSES: 1. Acute inferior myocardial infarction.    a. Peak CPK on 1010/121, peak troponin I 35.    b. Elective stent of 100% mid right coronary artery.    c. Residual mid left anterior descending aneurysm; nonobstructed       circumflex.    d. Normal left ventricular function. 2. Tobacco. 3. Family history of coronary  disease. 4. Hypoalbuminemia. DD:  04/02/00 TD:  04/02/00 Job: [id number]

## 2010-07-18 NOTE — Cardiovascular Report (Signed)
[place]  Patient:    [name] [name], [name] [name]                      MRN: [id number] Proc. Date: 03/31/00 Adm. Date:  [id number] Attending:  Colon Branch CC:         Noralyn Pick. Eden Emms, M.D. [facility]             [name]. [name], M.D. [facility]             Rollene Rotunda, M.D. [facility]                        Cardiac Catheterization  PROCEDURES PERFORMED:   PTCA and stenting of the distal RCA.  DIAGNOSES: 1. Single-vessel coronary artery disease. 2. Status post inferior wall myocardial infarction.  HISTORY:  [name] [name] is a 60 year old black gentleman who presents with substernal chest discomfort.  The patient was admitted to the hospital and subsequently ruled in for myocardial infarction with elevation of cardiac enzymes.  The patient underwent cardiac catheterization showing occlusion of the distal right coronary artery, with overall well preserved LV function.  He presents now for percutaneous intervention of the RCA.  TECHNIQUE:  After informed consent was obtained, a 6-French in the right femoral artery was exchanged over a wire for a 7-French sheath.  The patient had previously been on  aspirin.  He received heparin and Integrilin on a weight-adjusted basis, maintaining ACT of approximately 250 sec.  A 7-French JR4 guide catheter was then used to engage the right coronary artery, and appropriate guide shots obtained confirming occlusion of the right coronary artery at the distal bend.   A guide catheter was then used to engage the right coronary artery, and appropriate guide shots obtained confirming occlusion of the right coronary artery at the distal bend.  A high torque floppy wire was then advanced within a 2.5 x 20 mm Maverick balloon, and used to probe the lesion.  Despite multiple attempts, wire position to the distal vessel could not be confirmed due to mild tortuosity of the vessel, as well as severe disease in the distal RCA.  At this point  we elected to infuse TPA through a transit catheter positioned at the site of occlusion.  Then 20 mg were manually infused over 10 min.  Repeat angiography after the administration of intracoronary nitroglycerin now showed recanalization of the vessel, although there was still subtotal occlusion of the vessel in the distal RCA; however, the distal vessel, including a PDA and three posterior ventricular branches could now be identified.  With a transit catheter in place, a BMW wire was introduced and carefully negotiated into the distal RCA.  The 2.5 x 15 mm Maverick balloon was then reintroduced and a total of five inflations performed at 6 atm for up to 60 sec in the distal section of the vessel.  Repeat angiography showed recanalization of the vessel, with severe diffuse disease in the distal RCA.  A 3.0 x 30 mm Maverick balloon was introduced and three inflations performed in the distal vessel at 6 atm for 30 sec.  Angiography showed persistence of severe narrowing at the distal bend, and we elected to proceed with percutaneous stenting.  A 3.5 x 31 mm radius stent was then carefully positioned and deployed in the distal RCA, and post-dilated with a 4.0 x 15 mm Quantam Ranger balloon.  Three inflations were performed at 12 atm for 30 sec.  Repeat angiography showed an excellent result with perfect coverage of the vessel. There was mild residual narrowing of 50-60% at the proximal edge of the stent. It was felt that this represented uncovered diseased segment of the vessel. A 4.0 x 9 mm NIR Elite stent was introduced and deployed in the mid RCA at the proximal edge of the stent at 12 atm for 30 sec.  Angiography was now performed showing full coverage of the diseased segment, and only mild residual narrowing of 20-30% in the mid section of the radius stent.  It was felt that further dilatation would not be beneficial.  Angiography was then performed after the administration of  intracoronary nitroglycerin and Verapamil, showing excellent inflow; there was, however, slow eggressive contrast, suggesting damage to the myocardial bed or plugging of the capillary vessels.  There was no evidence of distal vessel damage.  The guide catheter was then removed and the sheath secured into position.  The patient tolerated the procedure well and was transferred back to the floor in stable condition.  FINAL RESULTS:  Successful PTCA and stenting of the distal RCA with reduction of 100% narrowing to approximately 20-30% and placement of a 3.5 x 31 mm radius stent, dilated to 4 mm; preceded by a 4.0 x 9 mm NIR Elite stent, with improvement in TIMI-0 to TIMI-2+ flow.  ASSESSMENT AND PLAN:  [name] [name] is a 60 year old gentleman, status post inferior wall myocardial infarction.  The patient will continue on Plavix for one months time and will continue his Integrilin for at least 48 hr, due to slow flow in the distal vessel; hopefully this will improve with further anticoagulation. DD:  03/31/00 TD:  03/31/00 Job: [id number]

## 2010-07-18 NOTE — Assessment & Plan Note (Signed)
[place]                         ELECTROPHYSIOLOGY OFFICE NOTE   NAME:Crosson, KONNER SAIZ                    MRN:          [id number]  DATE:02/25/2006                            DOB:          Mar 19, 1950    Mr. Novosad was seen today in the clinic on February 25, 2006, for a wound  check of his newly-implanted Medtronic model number 7232 Maximo, date of  implant was February 17, 2006, for ischemic cardiomyopathy.  On  interrogation of his device today, his battery voltage is 3.21 with a  charge time of 7.80 seconds.  R waves measured 8.4 mV with a ventricular  capture threshold of 1 V at 0.2 msec and a ventricular lead impedance of  408 Ohms.  Shock impedance was 51 Ohms.  There were no episodes since  implant date.  Onset was programmed on today.  His Steri-Strips were  removed today.  His wound was without redness or edema but there was a  little bit of an open area, so I washed the area off with peroxide and  reapplied new Steri-Strips with the instructions to the patient to let  them fall off at will.  He will be seen again in 3 months' time.      Altha Harm, LPN  Electronically Signed      Doylene Canning. Ladona Ridgel, MD  Electronically Signed   PO/MedQ  DD: 02/25/2006  DT: 02/25/2006  Job #: [id number]

## 2010-07-18 NOTE — Cardiovascular Report (Signed)
[place]. [facility]  Patient:    [name] [name], [name] [name]                      MRN: [id number] Proc. Date: 03/31/00 Adm. Date:  [id number] Attending:  Colon Branch CC:         Cardiac Catheterization Laboratory   Cardiac Catheterization  DATE OF BIRTH:  [birth year]  PROCEDURE:  Left heart catheterization/coronary arteriography.  CARDIOLOGIST:  Noralyn Pick. Eden Emms, M.D.  OPERATOR:  Rollene Rotunda, M.D.  INDICATIONS:  Evaluate patient with chest pain and probable inferior myocardial infarction.  DESCRIPTION OF PROCEDURE:  The left heart catheterization was performed via the right femoral artery.  The artery was cannulated using an anterior wall puncture.  A 6-French arterial sheath was inserted via the modified Seldinger technique.  A preformed Judkins and a pigtail catheter were utilized.  The patient tolerated the procedure well and left the laboratory in stable condition.  RESULTS: HEMODYNAMICS: LV:  121/25. AO:  125/73.  CORONARIES: 1. Left main coronary artery:  The left main coronary artery had a 25%    diffuse stenosis. 2. Left anterior descending coronary artery:  The LAD had a mid-aneurysmal    dilatation after a first diagonal.  Proximal to this aneurysmal dilatation    there was a 40% stenosis, and following was a 50% stenosis.  The first    diagonal had ostial 30% stenosis. 3. Circumflex coronary artery:  The circumflex coronary artery had diffuse    nonobstructive luminal irregularities.  The vessel narrowed after the    takeoff of a large mid-obtuse marginal.  After this marginal in the    AV groove there was a focal 25% stenosis. 4. Right coronary artery:  The right coronary artery was occluded in the    mid to distal segment before the PDA.  There was scant collateral flow    from the LAD to the distal RCA.  Note:  All of the vessels were dilated    with diffuse aneurysmal dilatations and slow flow, particularly in the  distal circumflex coronary artery and the first diagonal artery.  LEFT VENTRICULOGRAM:  The left ventriculogram was obtained in the RAO projection.  The ejection fraction was 60% with inferobasilar hypokinesis.  CONCLUSION:  Severe single-vessel coronary artery disease with diffuse nonobstructive disease elsewhere.  PLAN:  The patient will have attempt at percutaneous revascularization of the right coronary artery. DD:  03/31/00 TD:  03/31/00 Job: [id number]

## 2010-07-18 NOTE — H&P (Signed)
NAME:  [name] [name], [name] [name] NO.:  [id number]   MEDICAL RECORD NO.:  [id number]          PATIENT TYPE:  INP   LOCATION:  2923                         FACILITY:  [facility]   PHYSICIAN:  Arvilla Meres, M.D. LHCDATE OF BIRTH:  1950/08/28   DATE OF ADMISSION:  09/20/2004  DATE OF DISCHARGE:                                HISTORY & PHYSICAL   PRIMARY CARDIOLOGIST:  Dr. Olga Millers.   REASON FOR ADMISSION:  Acute anterolateral myocardial infarction.   HISTORY OF PRESENT ILLNESS:  [name] [name] is a very pleasant 60 year old male  with a history of coronary artery disease status post inferior wall  myocardial infarction in January, 2002 with reocclusion of the RCA in June  of 2002, who is now admitted through the ER for acute anterolateral  myocardial infarction.   According to the records [name] [name] experienced his initial inferior wall  myocardial infarction in January of 2002. This was treated with stenting of  distal RCA. Unfortunately, in June of 2002 he was admitted with unrelenting  chest pain and mild inferior ST elevation. He was taken back to the  catheterization lab which showed a re-occlusion of the RCA with a heavy  thrombus burden. He underwent PCA and stenting with three stents to the  distal RCA, mid RCA, and PDA by Veneda Melter. He developed recurrent chest  pain, was taken back to the catheterization lab the next day and was found  to have a totally occluded RCA proximally with a very heavy thrombus burden.  After multiple attempts at thrombectomy, there was restoration of just TIMI-  1 flow but unable to completely open the vessel. Thus medical therapy was  recommended. At that time his ejection fraction was 53% with moderate  disease in the left system and a 40% stenosis in the LAD. Of note, his post-  cath course was complicated by renal insufficiency and an ileus which was  thought due to his ACE inhibitor. This was discontinued and subsequently he  reports that his renal function has improved though I do not have a  documented evidence of this.   He did well from a cardiac point of view with only one or two episodes of  chest pain since that time, until this morning at 9:30 or 10 a.m. 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 At that point he developed a 2 out of 10 chest pain.  However he was able to finish work and return home. He then took a nap and  awoke at about 5 p.m. with severe chest pain, shortness of breath and  diaphoresis. He called his family to come take him to the ER and waited  until they got there. He finally arrived in the ER with continuing chest  pain. EKG shows an acute anterolateral ST elevation myocardial infarction  and we were called to admit him. He has been started on heparin and IV  nitroglycerin and now his pain is 2 to 3  out of 10.   PAST MEDICAL HISTORY:  1.  Coronary artery disease.      1.  Status post inferior wall myocardial  infarction in January of 2002          treated with tPA and stenting of the distal RCA.      2.  Reocclusion of the RCA in June of 2002 with stent placement x3 which          then reoccluded with a heavy thrombus burden and unable to be fully          opened and thus treated with medical therapy.  2.  History of acute renal insufficiency after catheterization. Questioned      secondary to ACE inhibitor. Reportedly resolved.  3.  Hypertension.  4.  Hyperlipidemia.  5.  History of kidney stones.  6.  Medication non compliance.   MEDICATIONS:  Aspirin.   ALLERGIES:  PENICILLIN.   SOCIAL HISTORY:  He lives in [place] with his wife and three  children. He works as a Statistician.  He has a history of tobacco use, about a 20 pack year history. Quit in 2000.  Denies alcohol use or drug use.   FAMILY HISTORY:  His mother is alive without CAD. Father has passed away, he  did not have cardiac disease.  He has seven brothers and sisters. One  brother does have CAD.   REVIEW OF  SYSTEMS:  Completely negative except as per HPI. Denies melena or  bright red blood per rectum or any problems with bleeding or neurologic  systems.   PHYSICAL EXAMINATION:  GENERAL:  He is lying flat in bed in just mild  discomfort.  VITAL SIGNS:  Blood pressure is 155/96, heart rate is 78, his respiratory  rate is 14, he is saturating 98% on 2 liters of oxygen.  HEENT:  Sclerae anicteric. Extraocular movements are intact. There are no  xanthelasma. Mucous membranes are moist. He has poor dentition with only a  few remaining teeth.  NECK:  Supple, there is no JVD. Carotids are 2+ bilaterally without any  bruits. There is no lymphadenopathy or thyromegaly.  CARDIAC:  He has a regular rate and rhythm with no significant murmurs,  rubs, or gallops.  LUNGS:  Clear to auscultation.  ABDOMEN:  Soft, nontender, nondistended. There is no hepatosplenomegaly, no  bruits, no masses.  EXTREMITIES:  Warm with no clubbing, cyanosis or edema. Femoral pulses are  2+ bilaterally without bruits. Distal pulses are 1+ bilaterally.  NEUROLOGIC EXAM:  He is alert and oriented x3. Cranial nerves II-XII grossly  intact.  Moves all four extremities without difficulty. His affect is appropriate.   Chest x-ray is currently pending. EKG shows normal sinus rhythm, 81, with 2  to 5 mm of ST elevation in V3 through V6. There are old inferior Q waves.  Labs are currently pending.   ASSESSMENT/PLAN:  [name] [name] is a 60 year old male with known coronary artery  disease who now presents with an acute anterolateral myocardial infarction.  Onset of pain was initially at 9:30 or 10 a.m., however it got acutely worse  at 5 p.m. Will currently treat him with heparin, aspirin, IV nitroglycerin,  beta blocker and Statin, and take him for urgent cardiac catheterization  with Dr. Charlies Constable.       DB/MEDQ  D:  09/20/2004  T:  09/21/2004  Job:  [id number]

## 2010-07-18 NOTE — Discharge Summary (Signed)
NAME:  ISHAM, SMITHERMAN NO.:  [id number]   MEDICAL RECORD NO.:  [id number]          PATIENT TYPE:  INP   LOCATION:  2923                         FACILITY:  [facility]   PHYSICIAN:  Olga Millers, M.D. LHCDATE OF BIRTH:  [birth year]   DATE OF ADMISSION:  09/20/2004  DATE OF DISCHARGE:  09/23/2004                           DISCHARGE SUMMARY - REFERRING   DATE OF ADMISSION:  September 20, 2004, Arvilla Meres, M.D.   DATE OF DISCHARGE:  September 23, 2004, Olga Millers, M.D.   HISTORY OF PRESENT ILLNESS:  Mr. Whitefield is a 60 year old African-American  male who has done well since his initial inferior wall myocardial infarction  in January of 2002.  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  He finished work and returned home.  After taking a nap and  awakening at around 5 P.M., his discomfort was felt to be severe, associated  with shortness of breath and diaphoresis.  His family brought him to the  emergency room and electrocardiogram showed anterior lateral ST segment  elevation.  He was started on intravenous heparin and nitroglycerin which  eased his discomfort but did not relieve it, thus emergent catheterization  was performed.   PAST MEDICAL HISTORY:  Past medical history is notable for inferior wall  myocardial infarction in January 2002 treated with TPA and stenting of the  distal right coronary artery.  He had re-occlusion of the right coronary  artery in June of 2002 with stent placement x3 which then re-occluded with a  heavy thrombus and was unable to be fully opened thus medical treatment.  He  also has a history of acute renal insufficiency post catheterization  possibly related to ACE inhibitor. He also has a history of hypertension,  hyperlipidemia, kidney stones and medication noncompliance.   ALLERGIES:  PENICILLIN.   LABORATORY DATA:  Chest x-ray on September 20, 2004 showed no active disease.  On  admission to the hospital the H/H were 13.7 and 41.9, normal indices,  platelet count was 211,000, white blood cell count 5.5.  Subsequent  hematologies were unremarkable.  Prior to discharge his H/H were 12.6 and  38.6, normal indices, platelet count 188,000, white blood cell count 6.0.  Admission PTT was 118.  Pro Time 17.6, fibrinogen was 278.  Admission sodium  132, potassium 4.8, BUN 10, creatinine 0.8, glucose 295, AST elevated at 61.  Subsequent chemistries on September 21, 2004 showed glucose of 352, AST and ALT  at 204 and 44.  Prior to discharge sodium was 136, potassium 4.3, BUN 9,  creatinine 0.9, glucose 216.  Hemoglobin A1C on September 21, 2004 was 15.8.  Initial CK-MB was 759 and 31.8, rose to a peak of 2530 with MB of 143.4.  Initial troponin was 7.46 which rose to greater than 100.  BNP was 65.  Fasting lipids on September 21, 2004 showed a total cholesterol 221,  triglycerides 79, HDL 37, LDL 168.  TSH was 1.008. CRP elevation was 4.1.  Urinalysis was positive for over  1,000 mg/dl of glucose, positive for  ketones, small amount of blood, as well as protein.  Electrocardiogram on  admission showed anterior lateral myocardial infarction.  Subsequent  electrocardiogram's showed evolving pattern with continued ST segment  elevation.  Echocardiogram on September 22, 2004 showed ejection fraction of 30  to 40%, periapical akinesis and distal septal akinesis, mild MR, left atrial  enlargement.  Technically difficult, apex was not well visualized, thrombus  could not be excluded.   HOSPITAL COURSE:  Mr. Ericsson was taken to the catheterization laboratory  urgently. He was also administered TNK.  Dr. Juanda Chance performed cardiac  catheterization which showed an old occluded right coronary artery, 70%  posterior lateral branch off the circumflex, a 50% proximal left anterior  descending, 99% proximal left anterior descending and 90% distal left  anterior descending.   Dr. Juanda Chance performed TAXUS stenting to the proximal  left anterior descending and angioplasty to the distal left anterior  descending restoring them to 0 and 20% respectively.  Ejection fraction of  35 to 40% with anterior apical slight hypokinesis, apical akinesis, and  inferior hypokinesis.  Post catheterization he was admitted to the coronary  care unit for further treatment.  Medications were adjusted.  Cardiac  rehabilitation and case management assisted in education, ambulation and  discharge needs.  Diabetes team also participated and recommended Lantus and  outpatient education.  Dr. [name], after reviewing the echocardiogram,  noted that he did have apical wall motion abnormalities but he felt the risk  of Coumadin in combination with aspirin and Plavix outweighed the benefits,  especially given his history of noncompliance with medications.   DISCHARGE DIAGNOSES:  1.  Acute anterior lateral myocardial infarction treated with TNK, TAXUS      stenting to the proximal left anterior descending and angioplasty to the      distal left anterior descending.  2.  Hypertension.  3.  Hyperglycemia with new diagnosis of diabetes.  4.  Hyperlipidemia with initiation of Statin therapy.  5.  Remote tobacco use history as previously.   DISPOSITION:  He was asked to continue aspirin 325 mg p.o. daily.  His new  medications include Plavix 75 mg daily X1 year, nitroglycerin 0.4 PRN,  Lipitor 80 mg q.h.s., Lopressor 50 mg b.i.d., Glucotrol 5 mg daily, Altace  2.5 mg b.i.d., Lantus 10 units q.h.s.   He has an appointment with Dr. Lesly Rubenstein on Tuesday, September 30, 2004 at 9:00  A.M. and he will see Dr. [name] on October 07, 2004 at 10:15 A.M.  Case  management assisted with obtaining a blood glucose monitor and strips.  Prescriptions were written.  He was asked to check his blood sugars before  meals and at bedtime and to report these.  When he has follow up appointments he is asked to bring all  medications and his blood sugar  results to all appointments.  Prior to discharge he received education in  regards to diabetes, cholesterol and myocardial infarction.  At the time of  follow up with Dr. [name] he will need an approximately 6 to 8 weeks of  blood work to check for fasting lipids as well as liver function tests since  Lipitor was initiated.  Titration of his medications should also be  considered as his blood pressure allows.      Irving Burton   EW/MEDQ  D:  09/23/2004  T:  09/23/2004  Job:  [id number]   cc:   Fax [phone] Dr. Lesly Rubenstein  (Please Fax ASAP)

## 2010-07-18 NOTE — Assessment & Plan Note (Signed)
[facility]                              CARDIOLOGY OFFICE NOTE   NAME:[name], [name] RASTETTER                    MRN:          [id number]  DATE:10/02/2005                            DOB:          28-Aug-1950    SUBJECTIVE:  [name] [name] is a very pleasant 60 year old male patient followed  by Dr. [name] with a history of coronary disease with a history of  inferior infarction in January 2002, treated with PCI with subsequent in-  stent restenosis and thrombosis requiring urgent revascularization in June  2002, complicated by recurrent thrombosis and was treated medically.  He  also has a history of anterior myocardial infarction in July 2006, treated  initially with TNK and then Taxus stenting secondary to poor response to  T&K.  He is part of the HORIZON study trial.  He has returned to the office  today for follow-up per the HORIZON study protocol.  He is doing well with  any chest pain, shortness of breath.  He denies any orthopnea or paroxysmal  nocturnal dyspnea.  He walks several times a week without any chest pain or  shortness of breath.  Denies any syncope, presyncope or palpitation.   CURRENT MEDICATIONS:  1.  Aspirin 325 mg daily.  2.  Plavix 75 mg daily.  3.  Glipizide 5 mg daily.  4.  Cozaar 25 mg daily.  5.  Niaspan 1 g daily.  6.  Toprol XL 100 mg a day.  7.  Insulin 30 units q.h.s.  8.  Nitroglycerin p.r.n.   PHYSICAL EXAMINATION:  GENERAL:  He is a well-nourished, well-developed male  in no acute distress.  VITAL SIGNS:  Blood pressure is 126/78, pulse 61, weight 268 pounds.  HEENT:  Unremarkable.  Eyes PERRLA, EOMI.  Sclerae are clear.  Oropharynx  without exudate.  NECK:  Without lymphadenopathy.  ENDOCRINE:  Without thyromegaly.  CARDIAC:  S1, S2, regular rate and rhythm without murmurs, clicks, rubs or  gallops.  LUNGS:  Clear to auscultation bilaterally without wheezing, rhonchi or rales  but decreased breath sounds  bilaterally.  ABDOMEN:  Soft, nontender, with normoactive bowel sounds.  No organomegaly.  EXTREMITIES:  Without edema.  Calves are soft, nontender bilaterally.  NEUROLOGIC:  Nonfocal.   Electrocardiogram reveals normal sinus rhythm with a heart rate of 62, left  axis deviation, inferior and anteroseptal Q-waves, no change from previous  tracings.   IMPRESSION:  1.  Coronary artery disease.      1.  History of inferior myocardial infarction in 2002 treated with          percutaneous coronary intervention and subsequent stent thrombosis          x2; right coronary artery totally occluded by recent catheterization          in July 2006.      2.  Status post anterolateral myocardial infarction in July 2006 treated          with Taxus stent x2 to the left anterior descending artery.      3.  Residual 70% stenosis in the mid to distal circumflex  at          catheterization July 2006.  2.  Ischemic cardiomyopathy with an ejection fraction of 30-40% by      echocardiogram November 2006.  3.  Treated dyslipidemia.  4.  Diabetes mellitus type 2.  5.  History of ACE inhibitor intolerance.  6.  History of renal insufficiency.  7.  History of nephrolithiasis.  8.  Ex-smoker.   PLAN:  The patient is doing well from a cardiac standpoint.  He is part of  the HORIZON study trial and is here today for follow-up.  I have made no  medication changes today, and he should follow up with Dr. [name] as  scheduled.  Of note, he was initially scheduled for a MUGA scan back in  November 2006 to reevaluate his LV function.  This was cancelled per the  patient report.  I will discuss this with Dr. [name] regarding any further  testing regarding his LV function.                                  Tereso Newcomer, PA-C    SW/MedQ  DD:  10/02/2005  DT:  10/02/2005  Job #:  [id number]
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
## 2010-07-18 NOTE — Cardiovascular Report (Signed)
[place]  Patient:    [name] [name], [name] [name]                      MRN: [id number] Proc. Date: 08/18/00 Adm. Date:  [id number] Attending:  Colon Branch CC:         Noralyn Pick. Eden Emms, M.D. [facility]  Cardiac Catheterization Laboratory  [facility]   Cardiac Catheterization  PROCEDURES PERFORMED: 1. Left heart catheterization with coronary angiography and left    ventriculography. 2. Percutaneous transluminal coronary angiography with angiojet thrombectomy    followed by stent placement x 3 in the mid to distal right coronary artery.  INDICATIONS:  Mr. Antenucci is a 60 year old male with history of previous stent placement x 2 in the distal right coronary artery.  He was admitted to the hospital earlier this morning after several hours of substernal chest pain. EKG showed minimal inferior ST segment elevation.  Because of persistent chest pain and the development of positive enzymes, he was brought emergently to the cardiac catheterization laboratory.  CATHETERIZATION PROCEDURAL NOTE:  A 6-French sheath was placed in the right femoral artery.  Standard Judkins 6-French catheters were utilized.  Contrast was Omnipaque.  There were no complications.  RESULTS:  HEMODYNAMICS:  Left ventricular pressure 140/20, aortic pressure 135/76. There was no aortic valve gradient.  LEFT VENTRICULOGRAM:  There was mild akinesis in the inferior basilar wall. Otherwise, normal wall motion.  Ejection fraction is calculated at 53%.  No mitral regurgitation.  CORONARY ARTERIOGRAPHY (RIGHT DOMINANT): The left main has minor luminal irregularities.  The left anterior descending artery has a 30% stenosis in the proximal vessel. In the mid vessel is a 40% stenosis, followed by a short area of ectasia, followed by a second 40% stenosis.  Further down in the mid vessel is a 30% stenosis.  The distal vessel has scattered 20% lesions.  There is a  large first diagonal branch which has diffuse 20% stenosis.  The left circumflex is a large ectatic vessel.  There is a 30% stenosis in the proximal vessel.  There is a very large branching first obtuse marginal.  In the distal portion of the obtuse marginal is a 40% stenosis.  In a small subbranch of this obtuse marginal is a 70% stenosis.  The distal circumflex beyond this obtuse marginal is very small, giving rise to a small second obtuse marginal.  There is a 60% stenosis in the distal circumflex just after the bifurcation of the first obtuse marginal.  The right coronary artery is a dominant vessel.  In the proximal vessel is a 95% stenosis, followed by a 100% occlusion of the mid vessel just after the right ventricular branch.  There were grade 2 left-to-right collaterals filling the distal right coronary artery.  IMPRESSIONS: 1. Mildly decreased left ventricular systolic function. 2. One-vessel coronary artery disease characterized by a 100% occlusion in the    mid right coronary artery.  There is moderate, but stable, disease in the    left coronaries.  PLAN:  Percutaneous intervention of the right coronary artery, see below.  PERCUTANEOUS TRANSLUMINAL CORONARY ANGIOGRAPHY PROCEDURAL NOTE:  Following completion of the diagnostic catheterization, we proceeded with coronary intervention.  We placed a 6-French sheath in the right femoral vein.  The 6-French sheath in the right femoral artery was exchanged for a 7-French sheath.  We used a 7-French JR-4 guiding catheter with side-holes.  We initially attempted to cross the occlusion with a high-torque floppy wire,  however, this would not cross.  We then were able to cross with a Choice PT wire, which was advanced into the distal right coronary artery.  At that point, there was still no perfusion of the distal vessel.  We, therefore, used a 3.0 x 20 mm Maverick balloon and inflated throughout the length of the mid to distal  vessel, including within the stents of the distal vessel, to 6 atm, 10 atm, 6 atm, and 6 atm respectively from distal to mid.  This did establish reperfusion into the distal vessel and revealed a significant thrombus burden throughout the mid to distal right coronary artery.  We, therefore, at that point placed a temporary pacemaker in the right ventricle.  The patient was also treated with intravenous aminophylline to prevent bradycardia.  We then performed angiojet thrombectomy utilizing the 4-French angiojet catheter.  A total of four passes with the angiojet were performed.  This revealed an insignificant improvement in the vessel lumen, however, there was still residual disease and thrombus in the mid vessel as well as in the distal vessel.  We then used a 4.0 x 20 mm Quantum balloon, inflating this to 6 atm, 12 atm, and then 12 atm in the previously placed stents within the distal vessel and 12 atm and 12 atm in the mid vessel.  There was still significant residual disease and, therefore, we placed a 4.0 x 33 mm heparin-coated BX Velocity stent in the mid vessel.  This was deployed at a pressure of 20 atm.  We then went back with the 4.0 x 20 Quantum balloon again and postdilated the distal stents to 20 atm.  We then postdilated the newly placed stent in the mid vessel with a 4.5 x 20 mm Maxum balloon.  This was inflated to 12 atm x 2 in the newly placed mid stent and 10 atm in the previously placed distal stents.  After that, we pulled the coronary guide wire to take additional images and this revealed that in the previously placed stents in the distal vessel there was a 90 degree bend within the stents and an area of kinking and significant renarrowing of the vessel.  We, therefore, recrossed this stent with a BMW wire.  In order to straighten out this kink in the stent, it was necessary to place a new stent within the previously placed stents in the distal vessel.  This was a  4.0  x 18 mm Penta stent which was deployed at 20 atm.  We then used the same stent delivery balloon, advancing it slightly more distal into the previously placed stents inflating it to 10 atm x 2.  Angiographic images at that point revealed resolution of the kink in the distally placed stents, however, beyond these stents was a significant area of stenosis was appeared to be 95% with possible thrombus.  The disease extended across the origin of the posterior descending artery.  We, therefore, placed a 3.0 x 18 mm AVE S-7 stent across this area.  The stent did extend across the origin of the posterior descending artery.  This stent was deployed at a pressure of 18 atm.  Finally, we went back with a 4.0 x 20 mm Quantum balloon and positioned this across the area of overlap of the newly placed stents with the previously placed stents and inflated this to 12 atm.  Multiple doses of intracoronary nitroglycerin and Verapamil were administered throughout the case for intermittent slow flow. Following the last inflation and last administration of  intracoronary medications, final angiographic images revealed patency of the right coronary artery.  There was 0% residual stenosis in the mid vessel, 20% residual stenosis within the previously placed stents, and 0% residual stenosis in the distal vessel beyond the previously placed stents.  There was TIMI-3 flow into the distal vessel.  Patency of the posterior descending artery was preserved.  COMPLICATIONS:  None.  Complex but successful percutaneous intervention of the right coronary artery involving angiojet thrombectomy, stent placement x 3, and percutaneous transluminal coronary angiography.  A 95%, followed by a 100% occlusion of the mid vessel was reduced to 0% residual with TIMI-3 flow.  The 95% to 100% occlusion of the previously placed stents was reduced to 20% residual with TIMI-3 flow.  The 95% stenosis beyond the previously placed stents was  reduced to 0% residual with TIMI-3 flow.  PLAN:  Integrilin will be continued for 24 hours.  It is recommended that Plavix be administered for six months because of the diffuse nature of the disease and multiple coronary stents.      DD:  08/18/00 TD:  08/18/00 Job: [id number]

## 2010-08-18 ENCOUNTER — Ambulatory Visit (INDEPENDENT_AMBULATORY_CARE_PROVIDER_SITE_OTHER): Payer: BC Managed Care – PPO | Admitting: Cardiology

## 2010-08-18 ENCOUNTER — Encounter: Payer: Self-pay | Admitting: Cardiology

## 2010-08-18 DIAGNOSIS — E785 Hyperlipidemia, unspecified: Secondary | ICD-10-CM

## 2010-08-18 DIAGNOSIS — I472 Ventricular tachycardia: Secondary | ICD-10-CM

## 2010-08-18 DIAGNOSIS — E7800 Pure hypercholesterolemia, unspecified: Secondary | ICD-10-CM

## 2010-08-18 DIAGNOSIS — I2589 Other forms of chronic ischemic heart disease: Secondary | ICD-10-CM

## 2010-08-18 DIAGNOSIS — Z95810 Presence of automatic (implantable) cardiac defibrillator: Secondary | ICD-10-CM

## 2010-08-18 DIAGNOSIS — I2510 Atherosclerotic heart disease of native coronary artery without angina pectoris: Secondary | ICD-10-CM

## 2010-08-18 DIAGNOSIS — I10 Essential (primary) hypertension: Secondary | ICD-10-CM

## 2010-08-18 MED ORDER — ROSUVASTATIN CALCIUM 40 MG PO TABS: 40.0000 mg | ORAL_TABLET | Freq: Every day | ORAL | Status: DC

## 2010-08-18 NOTE — Assessment & Plan Note (Signed)
Continue beta blocker and ARB. 

## 2010-08-18 NOTE — Assessment & Plan Note (Signed)
Continue aspirin. Resume Crestor at 40 mg daily.

## 2010-08-18 NOTE — Assessment & Plan Note (Signed)
As per electrophysiology.

## 2010-08-18 NOTE — Patient Instructions (Signed)
Your physician wants you to follow-up in: 6 months You will receive a reminder letter in the mail two months in advance. If you don't receive a letter, please call our office to schedule the follow-up appointment  Restart crestor 40mg  once daily

## 2010-08-18 NOTE — Progress Notes (Signed)
ZOX:WRUEAVWU gentleman with history of coronary disease who returns for followup. He has had previous PCI of his right coronary artery and his LAD. His last cardiac catheterization was performed on September 21, 2004 in the setting of an acute anterior myocardial infarction. His ejection fraction was 35-40%. He had a 99% mid LAD lesion as well as a 90% distal LAD lesion. There was a 70% mid to distal circumflex and the right coronary artery was totally occluded. The patient had PCI of his mid and distal LAD at that time. The mid LAD lesion was treated with a drug eluding stent. He's also had a previous ICD. His last echocardiogram was performed on January 07, 2005. His ejection fraction was 30-40%. There was mild to moderate mitral regurgitation. Last Myoview was performed in October of 2010. Ejection fraction was 27%. There was a prior apical and inferior infarct but no ischemia. Admitted in February of 2012 with ICD discharges and ventricular tachycardia. Sotalol was added to his medical regimen. Carotid Dopplers in April of 2012 showed 0-39% bilateral stenosis. I last saw him in March of 2012. Since then, patient denies dyspnea, chest pain, pedal edema, ICD discharge or syncope.    Current Outpatient Prescriptions  Medication Sig Dispense Refill  . aspirin 325 MG tablet Take 325 mg by mouth daily.        . insulin aspart (NOVOLOG) 100 UNIT/ML injection Inject 30 Units into the skin 2 (two) times daily.        . insulin regular (HUMULIN R,NOVOLIN R) 100 UNIT/ML injection Inject 10 Units into the skin 2 (two) times daily before a meal.        . losartan (COZAAR) 50 MG tablet Take 50 mg by mouth daily.        . magnesium oxide (MAG-OX) 400 MG tablet Take 1 tablet (400 mg total) by mouth daily.  90 tablet  3  . metoprolol (TOPROL-XL) 100 MG 24 hr tablet Take 1 tablet (100 mg total) by mouth daily.  90 tablet  3  . Multiple Vitamin (MULTIVITAMIN) tablet Take 1 tablet by mouth daily.        . sotalol (BETAPACE)  120 MG tablet Take 1 tablet (120 mg total) by mouth 2 (two) times daily.  180 tablet  3  . rosuvastatin (CRESTOR) 40 MG tablet Take 1 tablet (40 mg total) by mouth daily.  30 tablet  1     Past Medical History  Diagnosis Date  . Renal insufficiency   . DM type 2 (diabetes mellitus, type 2)   . Ischemic cardiomyopathy   . Nephrolithiasis   . History of MI (myocardial infarction)   . HTN (hypertension)   . Hypercholesteremia   . Coronary atherosclerosis of unspecified type of vessel, native or graft   . Ventricular tachycardia     Past Surgical History  Procedure Date  . Ptca 09/01/00    x2  . Cardiac defibrillator placement     History   Social History  . Marital Status: Married    Spouse Name: N/A    Number of Children: 3  . Years of Education: N/A   Occupational History  . brick layer    Social History Main Topics  . Smoking status: Former Smoker    Quit date: 03/02/2000  . Smokeless tobacco: Not on file  . Alcohol Use: No  . Drug Use: No  . Sexually Active: Not on file   Other Topics Concern  . Not on file   Social  History Narrative  . No narrative on file    ROS: no fevers or chills, productive cough, hemoptysis, dysphasia, odynophagia, melena, hematochezia, dysuria, hematuria, rash, seizure activity, orthopnea, PND, pedal edema, claudication. Remaining systems are negative.  Physical Exam: Well-developed well-nourished in no acute distress.  Skin is warm and dry.  HEENT is normal.  Neck is supple. No thyromegaly.  Chest is clear to auscultation with normal expansion.  Cardiovascular exam is regular rate and rhythm.  Abdominal exam nontender or distended. No masses palpated. Extremities show no edema. neuro grossly intact  ECG NSR, PVC, septal and inferior MI, anterior Twave inversion

## 2010-08-18 NOTE — Assessment & Plan Note (Signed)
Resume Crestor 40 mg daily. 

## 2010-08-18 NOTE — Assessment & Plan Note (Signed)
Continue present blood pressure medications. 

## 2010-08-18 NOTE — Assessment & Plan Note (Signed)
Continue sotalol

## 2011-05-27 ENCOUNTER — Telehealth: Payer: Self-pay | Admitting: Internal Medicine

## 2011-05-27 ENCOUNTER — Encounter: Payer: Self-pay | Admitting: Internal Medicine

## 2011-05-27 NOTE — Telephone Encounter (Signed)
05-27-11 sent past due letter, was due with taylor in February/mt

## 2011-07-10 ENCOUNTER — Encounter: Payer: Self-pay | Admitting: Cardiology

## 2011-07-10 ENCOUNTER — Encounter: Payer: Self-pay | Admitting: *Deleted

## 2011-07-10 ENCOUNTER — Ambulatory Visit (INDEPENDENT_AMBULATORY_CARE_PROVIDER_SITE_OTHER): Payer: BC Managed Care – PPO | Admitting: Cardiology

## 2011-07-10 VITALS — BP 133/70 | HR 85 | Resp 18 | Ht 71.0 in | Wt 235.4 lb

## 2011-07-10 DIAGNOSIS — I10 Essential (primary) hypertension: Secondary | ICD-10-CM

## 2011-07-10 DIAGNOSIS — E785 Hyperlipidemia, unspecified: Secondary | ICD-10-CM

## 2011-07-10 DIAGNOSIS — Z95810 Presence of automatic (implantable) cardiac defibrillator: Secondary | ICD-10-CM

## 2011-07-10 DIAGNOSIS — I2589 Other forms of chronic ischemic heart disease: Secondary | ICD-10-CM

## 2011-07-10 DIAGNOSIS — E7800 Pure hypercholesterolemia, unspecified: Secondary | ICD-10-CM

## 2011-07-10 DIAGNOSIS — I2510 Atherosclerotic heart disease of native coronary artery without angina pectoris: Secondary | ICD-10-CM

## 2011-07-10 DIAGNOSIS — I472 Ventricular tachycardia: Secondary | ICD-10-CM

## 2011-07-10 LAB — LIPID PANEL
Cholesterol: 243 mg/dL — ABNORMAL HIGH (ref 0–200)
HDL: 44.8 mg/dL (ref >39.00)
Triglycerides: 80 mg/dL (ref 0.0–149.0)
VLDL: 16 mg/dL (ref 0.0–40.0)

## 2011-07-10 LAB — HEPATIC FUNCTION PANEL
Albumin: 3.6 g/dL (ref 3.5–5.2)
Alkaline Phosphatase: 105 U/L (ref 39–117)
Total Protein: 7.6 g/dL (ref 6.0–8.3)

## 2011-07-10 LAB — BASIC METABOLIC PANEL
Chloride: 101 mEq/L (ref 96–112)
Creatinine, Ser: 1 mg/dL (ref 0.4–1.5)
Potassium: 4.6 mEq/L (ref 3.5–5.1)
Sodium: 135 mEq/L (ref 135–145)

## 2011-07-10 LAB — LDL CHOLESTEROL, DIRECT: Direct LDL: 197 mg/dL

## 2011-07-10 MED ORDER — METOPROLOL SUCCINATE ER 100 MG PO TB24: 100.0000 mg | ORAL_TABLET | Freq: Every day | ORAL | Status: DC

## 2011-07-10 MED ORDER — LOSARTAN POTASSIUM 50 MG PO TABS: 50.0000 mg | ORAL_TABLET | Freq: Every day | ORAL | Status: DC

## 2011-07-10 MED ORDER — SOTALOL HCL 120 MG PO TABS: 120.0000 mg | ORAL_TABLET | Freq: Two times a day (BID) | ORAL | Status: DC

## 2011-07-10 MED ORDER — ROSUVASTATIN CALCIUM 40 MG PO TABS: 40.0000 mg | ORAL_TABLET | Freq: Every day | ORAL | Status: DC

## 2011-07-10 NOTE — Assessment & Plan Note (Signed)
followup electrophysiology. 

## 2011-07-10 NOTE — Assessment & Plan Note (Signed)
Continue aspirin and statin. Schedule followup Myoview. 

## 2011-07-10 NOTE — Patient Instructions (Signed)
Your physician wants you to follow-up in: ONE YEAR WITH DR CRENSHAW You will receive a reminder letter in the mail two months in advance. If you don't receive a letter, please call our office to schedule the follow-up appointment.   Your physician has requested that you have en exercise stress myoview. For further information please visit [email or web address]. Please follow instruction sheet, as given.   Your physician recommends that you HAVE LAB WORK TODAY 

## 2011-07-10 NOTE — Assessment & Plan Note (Signed)
Continue ARB and beta blocker. 

## 2011-07-10 NOTE — Progress Notes (Signed)
Addended by: Freddi Starr on: 07/10/2011 09:54 AM   Modules accepted: Orders

## 2011-07-10 NOTE — Assessment & Plan Note (Signed)
Blood pressure controlled. Continue present medications. Check potassium and renal function. 

## 2011-07-10 NOTE — Progress Notes (Signed)
HPI: Pleasant gentleman with history of coronary disease who returns for followup. He has had previous PCI of his right coronary artery and his LAD. His last cardiac catheterization was performed on September 21, 2004 in the setting of an acute anterior myocardial infarction. His ejection fraction was 35-40%. He had a 99% mid LAD lesion as well as a 90% distal LAD lesion. There was a 70% mid to distal circumflex and the right coronary artery was totally occluded. The patient had PCI of his mid and distal LAD at that time. The mid LAD lesion was treated with a drug eluding stent. He's also had a previous ICD. His last echocardiogram was performed on January 07, 2005. His ejection fraction was 30-40%. There was mild to moderate mitral regurgitation. Last Myoview was performed in October of 2010. Ejection fraction was 27%. There was a prior apical and inferior infarct but no ischemia. Admitted in February of 2012 with ICD discharges and ventricular tachycardia. Sotalol was added to his medical regimen. Carotid Dopplers in April of 2012 showed 0-39% bilateral stenosis. I last saw him in June of 2012. Since then, patient denies dyspnea, chest pain, pedal edema, ICD discharge or syncope.    Current Outpatient Prescriptions  Medication Sig Dispense Refill  . losartan (COZAAR) 50 MG tablet Take 50 mg by mouth daily.        . magnesium oxide (MAG-OX) 400 MG tablet Take 1 tablet (400 mg total) by mouth daily.  90 tablet  3  . metoprolol (TOPROL-XL) 100 MG 24 hr tablet Take 1 tablet (100 mg total) by mouth daily.  90 tablet  3  . Multiple Vitamin (MULTIVITAMIN) tablet Take 1 tablet by mouth daily.        . rosuvastatin (CRESTOR) 40 MG tablet Take 1 tablet (40 mg total) by mouth daily.  90 tablet  4  . sotalol (BETAPACE) 120 MG tablet Take 1 tablet (120 mg total) by mouth 2 (two) times daily.  180 tablet  3  . aspirin 325 MG tablet Take 325 mg by mouth daily.        . insulin aspart (NOVOLOG) 100 UNIT/ML injection  Inject 30 Units into the skin 2 (two) times daily.        . insulin regular (HUMULIN R,NOVOLIN R) 100 UNIT/ML injection Inject 10 Units into the skin 2 (two) times daily before a meal.           Past Medical History  Diagnosis Date  . Renal insufficiency   . DM type 2 (diabetes mellitus, type 2)   . Ischemic cardiomyopathy   . Nephrolithiasis   . History of MI (myocardial infarction)   . HTN (hypertension)   . Hypercholesteremia   . Coronary atherosclerosis of unspecified type of vessel, native or graft   . Ventricular tachycardia     Past Surgical History  Procedure Date  . Ptca 09/01/00    x2  . Cardiac defibrillator placement     History   Social History  . Marital Status: Married    Spouse Name: N/A    Number of Children: 3  . Years of Education: N/A   Occupational History  . brick layer    Social History Main Topics  . Smoking status: Former Smoker    Quit date: 03/02/2000  . Smokeless tobacco: Not on file  . Alcohol Use: No  . Drug Use: No  . Sexually Active: Not on file   Other Topics Concern  . Not on file  Social History Narrative  . No narrative on file    ROS: no fevers or chills, productive cough, hemoptysis, dysphasia, odynophagia, melena, hematochezia, dysuria, hematuria, rash, seizure activity, orthopnea, PND, pedal edema, claudication. Remaining systems are negative.  Physical Exam: Well-developed well-nourished in no acute distress.  Skin is warm and dry.  HEENT is normal.  Neck is supple.  Chest is clear to auscultation with normal expansion.  Cardiovascular exam is regular rate and rhythm.  Abdominal exam nontender or distended. No masses palpated. Extremities show no edema. neuro grossly intact  ECG sinus rhythm at a rate of 89. Prior anterior and inferior infarct. First degree AV block.

## 2011-07-10 NOTE — Assessment & Plan Note (Signed)
Continue sotalol

## 2011-07-10 NOTE — Assessment & Plan Note (Signed)
Continue statin. Check lipids and liver. 

## 2011-07-15 ENCOUNTER — Ambulatory Visit (HOSPITAL_COMMUNITY): Payer: BC Managed Care – PPO | Attending: Cardiology | Admitting: Radiology

## 2011-07-15 VITALS — BP 130/83 | HR 58 | Ht 71.0 in | Wt 236.0 lb

## 2011-07-15 DIAGNOSIS — I252 Old myocardial infarction: Secondary | ICD-10-CM | POA: Insufficient documentation

## 2011-07-15 DIAGNOSIS — Z8249 Family history of ischemic heart disease and other diseases of the circulatory system: Secondary | ICD-10-CM | POA: Insufficient documentation

## 2011-07-15 DIAGNOSIS — Z794 Long term (current) use of insulin: Secondary | ICD-10-CM | POA: Insufficient documentation

## 2011-07-15 DIAGNOSIS — Z87891 Personal history of nicotine dependence: Secondary | ICD-10-CM | POA: Insufficient documentation

## 2011-07-15 DIAGNOSIS — E119 Type 2 diabetes mellitus without complications: Secondary | ICD-10-CM | POA: Insufficient documentation

## 2011-07-15 DIAGNOSIS — I10 Essential (primary) hypertension: Secondary | ICD-10-CM | POA: Insufficient documentation

## 2011-07-15 DIAGNOSIS — I779 Disorder of arteries and arterioles, unspecified: Secondary | ICD-10-CM | POA: Insufficient documentation

## 2011-07-15 DIAGNOSIS — Z9861 Coronary angioplasty status: Secondary | ICD-10-CM | POA: Insufficient documentation

## 2011-07-15 DIAGNOSIS — I2510 Atherosclerotic heart disease of native coronary artery without angina pectoris: Secondary | ICD-10-CM | POA: Insufficient documentation

## 2011-07-15 DIAGNOSIS — E785 Hyperlipidemia, unspecified: Secondary | ICD-10-CM | POA: Insufficient documentation

## 2011-07-15 MED ORDER — TECHNETIUM TC 99M TETROFOSMIN IV KIT
33.0000 | PACK | Freq: Once | INTRAVENOUS | Status: AC | PRN
  Administered 2011-07-15: 33 via INTRAVENOUS

## 2011-07-15 MED ORDER — TECHNETIUM TC 99M TETROFOSMIN IV KIT
11.0000 | PACK | Freq: Once | INTRAVENOUS | Status: AC | PRN
  Administered 2011-07-15: 11 via INTRAVENOUS

## 2011-07-15 MED ORDER — REGADENOSON 0.4 MG/5ML IV SOLN
0.4000 mg | Freq: Once | INTRAVENOUS | Status: AC
  Administered 2011-07-15: 0.4 mg via INTRAVENOUS

## 2011-07-15 NOTE — Progress Notes (Signed)
[facility] SITE 3 NUCLEAR MED [address] [place] [address] [phone]  Cardiology Nuclear Med Study  [name] [name] is a 61 y.o. male     MRN : [id number]     DOB: Sep 12, 1950  Procedure Date: 07/15/2011  Nuclear Med Background Indication for Stress Test:  Evaluation for Ischemia and PTCA/Stent Patency History:  '06 Acute AWMI>PTCA/Stent-LAD/RCA; '06 Echo:EF=30-40%; '07 ICD; '10 OZH:YQMVH apical and inferior infarct, no ischemia, EF=27% Cardiac Risk Factors: Carotid Disease, Family History - CAD, History of Smoking, Hypertension, IDDM Type 2 and Lipids  Symptoms:  No cardiac complaints, f/u CAD.   Nuclear Pre-Procedure Caffeine/Decaff Intake:  None NPO After: 9:00pm   Lungs:  clear O2 Sat: 98% on room air. IV 0.9% NS with Angio Cath:  20g  IV Site: R Antecubital  IV Started by:  Bonnita Levan, RN  Chest Size (in):  46 Cup Size: n/a  Height: 5\' 11"  (1.803 m)  Weight:  236 lb (107.049 kg)  BMI:  Body mass index is 32.92 kg/(m^2). Tech Comments:  BS= 136@ 715 AM    Nuclear Med Study 1 or 2 day study: 1 day  Stress Test Type:  Stress  Reading MD: Willa Rough, MD  Order Authorizing Provider:  Olga Millers, MD  Resting Radionuclide: Technetium 40m Tetrofosmin  Resting Radionuclide Dose: 11.0 mCi   Stress Radionuclide:  Technetium 17m Tetrofosmin  Stress Radionuclide Dose: 33.0 mCi           Stress Protocol Rest HR: 58 Stress HR: 72  Rest BP: 130/83 Stress BP: 130/80  Exercise Time (min): n/a METS: n/a   Predicted Max HR: 159 bpm % Max HR: 45.28 bpm Rate Pressure Product: 9360   Dose of Adenosine (mg):  n/a Dose of Lexiscan: 0.4 mg  Dose of Atropine (mg): n/a Dose of Dobutamine: n/a mcg/kg/min (at max HR)  Stress Test Technologist: Smiley Houseman, CMA-N  Nuclear Technologist:  Domenic Polite, CNMT     Rest Procedure:  Myocardial perfusion imaging was performed at rest 45 minutes following the intravenous administration of Technetium 22m  Tetrofosmin.  Rest ECG: Prior ASWMI with nonspecific T-wave changes.  Stress Procedure:  The patient received IV Lexiscan 0.4 mg over 15-seconds.  Technetium 89m Tetrofosmin injected at 30-seconds.  There were no significant changes with Lexiscan bolus, occasional PVC's were noted.  Quantitative spect images were obtained after a 45 minute delay.  Stress ECG: No significant change from baseline ECG  QPS Raw Data Images:  Patient motion noted; appropriate software correction applied. Stress Images:  There is severe decrease in uptake in the entire apex. In addition there is moderate decreased uptake in the inferior wall. There is also moderate decreased uptake in a moderate-sized area of affecting the inferior septum and anterior septum. Rest Images:  The images are similar to stress. Subtraction (SDS):  There is no significant reversibility. Transient Ischemic Dilatation (Normal <1.22):  1.04 Lung/Heart Ratio (Normal <0.45):  0.36  Quantitative Gated Spect Images QGS EDV:  230 ml QGS ESV:  172 ml  Impression Exercise Capacity:  Lexiscan with no exercise. BP Response:  Normal blood pressure response. Clinical Symptoms:  Head pressure ECG Impression:  No significant ST segment change suggestive of ischemia. Comparison with Prior Nuclear Study:  Refer to the comments below in the overall impression Overall Impression:  There is severe left ventricular dysfunction. There are multiple areas of scar. There is no significant ischemia. I do not have images from this study in 2010. The  report describes decreased activity in the inferior wall in the apex suggesting prior inferior and apical infarcts. In the prior report there is no mention of decreased activity in the septum. The current study shows evidence of scar in the apex. There is scar that affects the inferior wall. In addition there is scar in the mid to apical septum. I cannot be sure of this is new or old.  LV Ejection Fraction: 25%.  LV  Wall Motion:  There is global hypokinesis. The inferior wall is akinetic  .Willa Rough, MD

## 2011-08-04 ENCOUNTER — Encounter: Payer: Self-pay | Admitting: Internal Medicine

## 2011-08-04 ENCOUNTER — Ambulatory Visit (INDEPENDENT_AMBULATORY_CARE_PROVIDER_SITE_OTHER): Payer: Self-pay | Admitting: Internal Medicine

## 2011-08-04 VITALS — BP 124/82 | HR 70 | Ht 71.0 in | Wt 238.0 lb

## 2011-08-04 DIAGNOSIS — I10 Essential (primary) hypertension: Secondary | ICD-10-CM

## 2011-08-04 DIAGNOSIS — I2589 Other forms of chronic ischemic heart disease: Secondary | ICD-10-CM

## 2011-08-04 DIAGNOSIS — Z95810 Presence of automatic (implantable) cardiac defibrillator: Secondary | ICD-10-CM

## 2011-08-04 DIAGNOSIS — I472 Ventricular tachycardia: Secondary | ICD-10-CM

## 2011-08-04 NOTE — Assessment & Plan Note (Signed)
He has had no recurrent ventricular arrhythmias for several months now. He will continue sotalol.

## 2011-08-04 NOTE — Assessment & Plan Note (Signed)
He denies anginal symptoms. He will continue his current medical therapy. 

## 2011-08-04 NOTE — Patient Instructions (Signed)
Your physician wants you to follow-up in: 12 months with Dr Taylor You will receive a reminder letter in the mail two months in advance. If you don't receive a letter, please call our office to schedule the follow-up appointment.   Remote monitoring is used to monitor your Pacemaker of ICD from home. This monitoring reduces the number of office visits required to check your device to one time per year. It allows us to keep an eye on the functioning of your device to ensure it is working properly. You are scheduled for a device check from home on 11/12/11. You may send your transmission at any time that day. If you have a wireless device, the transmission will be sent automatically. After your physician reviews your transmission, you will receive a postcard with your next transmission date.   

## 2011-08-04 NOTE — Assessment & Plan Note (Signed)
His device is working normally. We'll plan to recheck in several months. 

## 2011-08-04 NOTE — Assessment & Plan Note (Signed)
His blood pressure is well controlled. He will continue his current medical therapy and maintain a low-sodium diet. 

## 2011-08-04 NOTE — Progress Notes (Signed)
HPI [name] returns today for followup. He is a 61 year old man with an ischemic cardiomyopathy, ventricular tachycardia, class I heart failure, status post ICD implantation. He is been maintained in sinus rhythm on sotalol. He has had no recent ICD shocks. 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 He denies chest pain, shortness of breath, or peripheral edema. No syncope. No ICD shocks. Allergies  Allergen Reactions  . Penicillins     history     Current Outpatient Prescriptions  Medication Sig Dispense Refill  . aspirin 325 MG tablet Take 325 mg by mouth daily.        . insulin aspart (NOVOLOG) 100 UNIT/ML injection Inject 30 Units into the skin 2 (two) times daily.        . insulin regular (HUMULIN R,NOVOLIN R) 100 UNIT/ML injection Inject 10 Units into the skin 2 (two) times daily before a meal.        . losartan (COZAAR) 50 MG tablet Take 1 tablet (50 mg total) by mouth daily.  90 tablet  4  . magnesium oxide (MAG-OX) 400 MG tablet Take 1 tablet (400 mg total) by mouth daily.  90 tablet  3  . metoprolol succinate (TOPROL-XL) 100 MG 24 hr tablet Take 1 tablet (100 mg total) by mouth daily.  90 tablet  3  . Multiple Vitamin (MULTIVITAMIN) tablet Take 1 tablet by mouth daily.        . rosuvastatin (CRESTOR) 40 MG tablet Take 1 tablet (40 mg total) by mouth daily.  90 tablet  4  . sotalol (BETAPACE) 120 MG tablet Take 1 tablet (120 mg total) by mouth 2 (two) times daily.  180 tablet  3     Past Medical History  Diagnosis Date  . Renal insufficiency   . DM type 2 (diabetes mellitus, type 2)   . Ischemic cardiomyopathy   . Nephrolithiasis   . History of MI (myocardial infarction)   . HTN (hypertension)   . Hypercholesteremia   . Coronary atherosclerosis of unspecified type of vessel, native or graft   . Ventricular tachycardia     ROS:   All systems reviewed and negative except as noted in the HPI.   Past Surgical History  Procedure Date  . Ptca 09/01/00    x2    . Cardiac defibrillator placement      No family history on file.   History   Social History  . Marital Status: Married    Spouse Name: N/A    Number of Children: 3  . Years of Education: N/A   Occupational History  . brick layer    Social History Main Topics  . Smoking status: Former Smoker    Quit date: 03/02/2000  . Smokeless tobacco: Not on file  . Alcohol Use: No  . Drug Use: No  . Sexually Active: Not on file   Other Topics Concern  . Not on file   Social History Narrative  . No narrative on file     BP 124/82  Pulse 70  Ht 5\' 11"  (1.803 m)  Wt 238 lb (107.956 kg)  BMI 33.19 kg/m2  Physical Exam:  Well appearing middle-aged man, NAD HEENT: Unremarkable Neck:  No JVD, no thyromegally Lungs:  Clear with no wheezes, rales, or rhonchi. Well-healed ICD incision. HEART:  Regular rate rhythm, no murmurs, no rubs, no clicks Abd:  soft, positive bowel sounds, no organomegally, no rebound, no guarding Ext:  2 plus pulses, no edema, no  cyanosis, no clubbing Skin:  No rashes no nodules Neuro:  CN II through XII intact, motor grossly intact  DEVICE  Normal device function.  See PaceArt for details.   Assess/Plan:

## 2011-08-05 LAB — ICD DEVICE OBSERVATION
BATTERY VOLTAGE: 2.97 V
HV IMPEDENCE: 49 Ohm
RV LEAD AMPLITUDE: 6.7 mv
RV LEAD IMPEDENCE ICD: 424 Ohm
RV LEAD THRESHOLD: 1 V
TZAT-0004FASTVT: 8
TZAT-0011SLOWVT: 10 ms
TZAT-0011SLOWVT: 10 ms
TZAT-0012FASTVT: 200 ms
TZAT-0012SLOWVT: 200 ms
TZAT-0012SLOWVT: 200 ms
TZAT-0013FASTVT: 1
TZAT-0018FASTVT: NEGATIVE
TZAT-0018SLOWVT: NEGATIVE
TZAT-0018SLOWVT: NEGATIVE
TZAT-0019SLOWVT: 8 V
TZAT-0019SLOWVT: 8 V
TZAT-0020FASTVT: 1.6 ms
TZON-0003FASTVT: 240 ms
TZON-0003SLOWVT: 350 ms
TZON-0005SLOWVT: 12
TZON-0008FASTVT: 0 ms
TZON-0008SLOWVT: 0 ms
TZST-0001FASTVT: 4
TZST-0001FASTVT: 5
TZST-0001SLOWVT: 4
TZST-0003FASTVT: 35 J
TZST-0003FASTVT: 35 J
TZST-0003SLOWVT: 35 J

## 2011-11-12 ENCOUNTER — Encounter: Payer: Self-pay | Admitting: *Deleted

## 2011-11-27 ENCOUNTER — Encounter: Payer: Self-pay | Admitting: *Deleted

## 2012-02-25 ENCOUNTER — Encounter: Payer: Self-pay | Admitting: *Deleted

## 2012-03-10 ENCOUNTER — Encounter: Payer: Self-pay | Admitting: *Deleted

## 2012-04-30 DIAGNOSIS — I4892 Unspecified atrial flutter: Secondary | ICD-10-CM

## 2012-04-30 HISTORY — DX: Unspecified atrial flutter: I48.92

## 2012-05-17 ENCOUNTER — Emergency Department (HOSPITAL_COMMUNITY): Payer: Self-pay

## 2012-05-17 ENCOUNTER — Inpatient Hospital Stay (HOSPITAL_COMMUNITY)
Admission: EM | Admit: 2012-05-17 | Discharge: 2012-05-20 | DRG: 250 | Disposition: A | Discharge status: Home / Self Care | Payer: MEDICAID | Attending: Cardiology | Admitting: Cardiology

## 2012-05-17 ENCOUNTER — Encounter (HOSPITAL_COMMUNITY): Payer: Self-pay | Admitting: Emergency Medicine

## 2012-05-17 DIAGNOSIS — Z9861 Coronary angioplasty status: Secondary | ICD-10-CM

## 2012-05-17 DIAGNOSIS — Z9181 History of falling: Secondary | ICD-10-CM

## 2012-05-17 DIAGNOSIS — I480 Paroxysmal atrial fibrillation: Secondary | ICD-10-CM | POA: Diagnosis present

## 2012-05-17 DIAGNOSIS — I10 Essential (primary) hypertension: Secondary | ICD-10-CM | POA: Diagnosis present

## 2012-05-17 DIAGNOSIS — I2510 Atherosclerotic heart disease of native coronary artery without angina pectoris: Secondary | ICD-10-CM | POA: Diagnosis present

## 2012-05-17 DIAGNOSIS — M549 Dorsalgia, unspecified: Secondary | ICD-10-CM | POA: Diagnosis present

## 2012-05-17 DIAGNOSIS — R112 Nausea with vomiting, unspecified: Secondary | ICD-10-CM

## 2012-05-17 DIAGNOSIS — I255 Ischemic cardiomyopathy: Secondary | ICD-10-CM | POA: Diagnosis present

## 2012-05-17 DIAGNOSIS — I509 Heart failure, unspecified: Secondary | ICD-10-CM | POA: Diagnosis present

## 2012-05-17 DIAGNOSIS — Z794 Long term (current) use of insulin: Secondary | ICD-10-CM

## 2012-05-17 DIAGNOSIS — I129 Hypertensive chronic kidney disease with stage 1 through stage 4 chronic kidney disease, or unspecified chronic kidney disease: Secondary | ICD-10-CM | POA: Diagnosis present

## 2012-05-17 DIAGNOSIS — R079 Chest pain, unspecified: Secondary | ICD-10-CM

## 2012-05-17 DIAGNOSIS — Z87891 Personal history of nicotine dependence: Secondary | ICD-10-CM

## 2012-05-17 DIAGNOSIS — Z9119 Patient's noncompliance with other medical treatment and regimen: Secondary | ICD-10-CM

## 2012-05-17 DIAGNOSIS — I2589 Other forms of chronic ischemic heart disease: Secondary | ICD-10-CM | POA: Diagnosis present

## 2012-05-17 DIAGNOSIS — R0602 Shortness of breath: Secondary | ICD-10-CM | POA: Diagnosis present

## 2012-05-17 DIAGNOSIS — E785 Hyperlipidemia, unspecified: Secondary | ICD-10-CM | POA: Diagnosis present

## 2012-05-17 DIAGNOSIS — Z7982 Long term (current) use of aspirin: Secondary | ICD-10-CM

## 2012-05-17 DIAGNOSIS — E119 Type 2 diabetes mellitus without complications: Secondary | ICD-10-CM

## 2012-05-17 DIAGNOSIS — I4729 Other ventricular tachycardia: Secondary | ICD-10-CM | POA: Diagnosis present

## 2012-05-17 DIAGNOSIS — I959 Hypotension, unspecified: Secondary | ICD-10-CM | POA: Diagnosis present

## 2012-05-17 DIAGNOSIS — I472 Ventricular tachycardia, unspecified: Secondary | ICD-10-CM | POA: Diagnosis present

## 2012-05-17 DIAGNOSIS — R0600 Dyspnea, unspecified: Secondary | ICD-10-CM

## 2012-05-17 DIAGNOSIS — N200 Calculus of kidney: Secondary | ICD-10-CM

## 2012-05-17 DIAGNOSIS — N189 Chronic kidney disease, unspecified: Secondary | ICD-10-CM | POA: Diagnosis present

## 2012-05-17 DIAGNOSIS — Z91199 Patient's noncompliance with other medical treatment and regimen due to unspecified reason: Secondary | ICD-10-CM

## 2012-05-17 DIAGNOSIS — R0601 Orthopnea: Secondary | ICD-10-CM

## 2012-05-17 DIAGNOSIS — I252 Old myocardial infarction: Secondary | ICD-10-CM

## 2012-05-17 DIAGNOSIS — Z8249 Family history of ischemic heart disease and other diseases of the circulatory system: Secondary | ICD-10-CM

## 2012-05-17 DIAGNOSIS — N259 Disorder resulting from impaired renal tubular function, unspecified: Secondary | ICD-10-CM

## 2012-05-17 DIAGNOSIS — E1121 Type 2 diabetes mellitus with diabetic nephropathy: Secondary | ICD-10-CM | POA: Diagnosis present

## 2012-05-17 DIAGNOSIS — E7800 Pure hypercholesterolemia, unspecified: Secondary | ICD-10-CM | POA: Diagnosis present

## 2012-05-17 DIAGNOSIS — K319 Disease of stomach and duodenum, unspecified: Secondary | ICD-10-CM | POA: Diagnosis present

## 2012-05-17 DIAGNOSIS — G459 Transient cerebral ischemic attack, unspecified: Secondary | ICD-10-CM

## 2012-05-17 DIAGNOSIS — I4892 Unspecified atrial flutter: Principal | ICD-10-CM | POA: Diagnosis present

## 2012-05-17 DIAGNOSIS — I5023 Acute on chronic systolic (congestive) heart failure: Secondary | ICD-10-CM | POA: Diagnosis present

## 2012-05-17 DIAGNOSIS — Z95810 Presence of automatic (implantable) cardiac defibrillator: Secondary | ICD-10-CM | POA: Diagnosis present

## 2012-05-17 HISTORY — DX: Presence of automatic (implantable) cardiac defibrillator: Z95.810

## 2012-05-17 LAB — CBC WITH DIFFERENTIAL/PLATELET
Basophils Absolute: 0 10*3/uL (ref 0.0–0.1)
Basophils Relative: 1 % (ref 0–1)
Eosinophils Absolute: 0.1 10*3/uL (ref 0.0–0.7)
Hemoglobin: 14.4 g/dL (ref 13.0–17.0)
MCHC: 33.8 g/dL (ref 30.0–36.0)
Monocytes Relative: 12 % (ref 3–12)
Neutro Abs: 2.2 10*3/uL (ref 1.7–7.7)
Neutrophils Relative %: 50 % (ref 43–77)
Platelets: 188 10*3/uL (ref 150–400)
RDW: 13.7 % (ref 11.5–15.5)

## 2012-05-17 LAB — GLUCOSE, CAPILLARY

## 2012-05-17 LAB — POCT I-STAT, CHEM 8
HCT: 46 % (ref 39.0–52.0)
Hemoglobin: 15.6 g/dL (ref 13.0–17.0)
Potassium: 4.5 mEq/L (ref 3.5–5.1)
Sodium: 137 mEq/L (ref 135–145)
TCO2: 25 mmol/L (ref 0–100)

## 2012-05-17 LAB — POCT I-STAT TROPONIN I: Troponin i, poc: 0.01 ng/mL (ref 0.00–0.08)

## 2012-05-17 LAB — HEPARIN LEVEL (UNFRACTIONATED): Heparin Unfractionated: 0.6 IU/mL (ref 0.30–0.70)

## 2012-05-17 LAB — PRO B NATRIURETIC PEPTIDE: Pro B Natriuretic peptide (BNP): 770.7 pg/mL — ABNORMAL HIGH (ref 0–125)

## 2012-05-17 LAB — PROTIME-INR: Prothrombin Time: 14.9 seconds (ref 11.6–15.2)

## 2012-05-17 LAB — APTT: aPTT: 31 seconds (ref 24–37)

## 2012-05-17 MED ORDER — SODIUM CHLORIDE 0.9 % IJ SOLN
3.0000 mL | Freq: Two times a day (BID) | INTRAMUSCULAR | Status: DC
  Administered 2012-05-17 – 2012-05-20 (×4): 3 mL via INTRAVENOUS

## 2012-05-17 MED ORDER — HEPARIN BOLUS VIA INFUSION
4000.0000 [IU] | Freq: Once | INTRAVENOUS | Status: AC
  Administered 2012-05-17: 4000 [IU] via INTRAVENOUS
  Filled 2012-05-17: qty 4000

## 2012-05-17 MED ORDER — SOTALOL HCL 120 MG PO TABS
120.0000 mg | ORAL_TABLET | Freq: Once | ORAL | Status: AC
  Administered 2012-05-17: 120 mg via ORAL
  Filled 2012-05-17: qty 1

## 2012-05-17 MED ORDER — INSULIN ASPART 100 UNIT/ML ~~LOC~~ SOLN: 30.0000 [IU] | Freq: Two times a day (BID) | SUBCUTANEOUS | Status: DC

## 2012-05-17 MED ORDER — ATORVASTATIN CALCIUM 80 MG PO TABS: 80.0000 mg | ORAL_TABLET | Freq: Every day | ORAL | Status: DC

## 2012-05-17 MED ORDER — INSULIN ASPART 100 UNIT/ML ~~LOC~~ SOLN: 0.0000 [IU] | Freq: Every day | SUBCUTANEOUS | Status: DC

## 2012-05-17 MED ORDER — SIMVASTATIN 40 MG PO TABS
40.0000 mg | ORAL_TABLET | Freq: Every day | ORAL | Status: DC
  Administered 2012-05-17 – 2012-05-19 (×3): 40 mg via ORAL
  Filled 2012-05-17 (×4): qty 1

## 2012-05-17 MED ORDER — ASPIRIN 325 MG PO TABS
325.0000 mg | ORAL_TABLET | Freq: Every day | ORAL | Status: DC
  Filled 2012-05-17: qty 1

## 2012-05-17 MED ORDER — ALPRAZOLAM 0.25 MG PO TABS: 0.2500 mg | ORAL_TABLET | Freq: Two times a day (BID) | ORAL | Status: DC | PRN

## 2012-05-17 MED ORDER — INSULIN ASPART 100 UNIT/ML ~~LOC~~ SOLN
0.0000 [IU] | Freq: Three times a day (TID) | SUBCUTANEOUS | Status: DC
  Administered 2012-05-17: 8 [IU] via SUBCUTANEOUS
  Administered 2012-05-18: 5 [IU] via SUBCUTANEOUS
  Administered 2012-05-18 – 2012-05-20 (×4): 3 [IU] via SUBCUTANEOUS

## 2012-05-17 MED ORDER — MAGNESIUM OXIDE 400 MG PO TABS
400.0000 mg | ORAL_TABLET | Freq: Every day | ORAL | Status: DC
  Administered 2012-05-18 – 2012-05-20 (×3): 400 mg via ORAL
  Filled 2012-05-17 (×4): qty 1

## 2012-05-17 MED ORDER — CARVEDILOL 3.125 MG PO TABS
3.1250 mg | ORAL_TABLET | Freq: Two times a day (BID) | ORAL | Status: DC
  Administered 2012-05-17 – 2012-05-19 (×2): 3.125 mg via ORAL
  Filled 2012-05-17 (×8): qty 1

## 2012-05-17 MED ORDER — ACETAMINOPHEN 325 MG PO TABS: 650.0000 mg | ORAL_TABLET | ORAL | Status: DC | PRN

## 2012-05-17 MED ORDER — NITROGLYCERIN 0.4 MG SL SUBL: 0.4000 mg | SUBLINGUAL_TABLET | SUBLINGUAL | Status: DC | PRN

## 2012-05-17 MED ORDER — HYDROCODONE-ACETAMINOPHEN 5-325 MG PO TABS
1.0000 | ORAL_TABLET | Freq: Four times a day (QID) | ORAL | Status: DC | PRN
  Administered 2012-05-17: 2 via ORAL
  Administered 2012-05-18: 1 via ORAL
  Filled 2012-05-17: qty 2
  Filled 2012-05-17: qty 1

## 2012-05-17 MED ORDER — INSULIN REGULAR HUMAN 100 UNIT/ML IJ SOLN: 10.0000 [IU] | Freq: Two times a day (BID) | INTRAMUSCULAR | Status: DC

## 2012-05-17 MED ORDER — SODIUM CHLORIDE 0.9 % IJ SOLN: 3.0000 mL | INTRAMUSCULAR | Status: DC | PRN

## 2012-05-17 MED ORDER — SODIUM CHLORIDE 0.9 % IV SOLN: 250.0000 mL | INTRAVENOUS | Status: DC | PRN

## 2012-05-17 MED ORDER — INSULIN ASPART 100 UNIT/ML ~~LOC~~ SOLN: 0.0000 [IU] | Freq: Three times a day (TID) | SUBCUTANEOUS | Status: DC

## 2012-05-17 MED ORDER — METOPROLOL SUCCINATE ER 100 MG PO TB24
100.0000 mg | ORAL_TABLET | Freq: Once | ORAL | Status: AC
  Administered 2012-05-17: 100 mg via ORAL
  Filled 2012-05-17: qty 1

## 2012-05-17 MED ORDER — HEPARIN (PORCINE) IN NACL 100-0.45 UNIT/ML-% IJ SOLN
1100.0000 [IU]/h | INTRAMUSCULAR | Status: DC
  Administered 2012-05-17: 1450 [IU]/h via INTRAVENOUS
  Administered 2012-05-18: 1300 [IU]/h via INTRAVENOUS
  Administered 2012-05-18: 1100 [IU]/h via INTRAVENOUS
  Administered 2012-05-18: 1450 [IU]/h via INTRAVENOUS
  Filled 2012-05-17 (×4): qty 250

## 2012-05-17 MED ORDER — ZOLPIDEM TARTRATE 5 MG PO TABS
5.0000 mg | ORAL_TABLET | Freq: Every evening | ORAL | Status: DC | PRN
  Administered 2012-05-20: 5 mg via ORAL
  Filled 2012-05-17: qty 1

## 2012-05-17 MED ORDER — ONDANSETRON HCL 4 MG/2ML IJ SOLN
4.0000 mg | Freq: Four times a day (QID) | INTRAMUSCULAR | Status: DC | PRN
  Administered 2012-05-18: 4 mg via INTRAVENOUS
  Filled 2012-05-17: qty 2

## 2012-05-17 MED ORDER — METOPROLOL SUCCINATE 12.5 MG HALF TABLET: 12.5000 mg | ORAL_TABLET | Freq: Two times a day (BID) | ORAL | Status: DC

## 2012-05-17 MED ORDER — ONE-DAILY MULTI VITAMINS PO TABS
1.0000 | ORAL_TABLET | Freq: Every day | ORAL | Status: DC
  Administered 2012-05-18 – 2012-05-20 (×2): 1 via ORAL
  Filled 2012-05-17 (×4): qty 1

## 2012-05-17 MED ORDER — SOTALOL HCL 120 MG PO TABS
120.0000 mg | ORAL_TABLET | Freq: Two times a day (BID) | ORAL | Status: DC
  Administered 2012-05-17 – 2012-05-20 (×6): 120 mg via ORAL
  Filled 2012-05-17 (×7): qty 1

## 2012-05-17 NOTE — H&P (Signed)
CARDIOLOGY HISTORY AND PHYSICAL   Patient ID: [name] [name] MRN: [id number] DOB/AGE: 1950/09/29 62 y.o.  Admit date: 05/17/2012  Primary Physician   Aida Puffer, MD Primary Cardiologist   [facility] Reason for Consultation  SOB  [name] [name] is a 62 y.o. male with a history of CAD, ischemic cardiomyopathy, ICD, HTN, DM, and renal insufficiency without previous history of arrhythmias who is being evaluated for SOB and new onset atrial flutter.   He presented to the ED today with nonexertional dyspnea x 8 days which occurs only while lying down. The patient reports that about 10 days ago he was walking backwards and tripped and fell onto his back while hunting. He denies lightheadedness, dizziness, syncope, palpitations, or chest pain before falling. He did hit his head but denies HA or LOC after the fall. He reports intermittent back pain in the lumbar spine region and right flank since then. His dyspnea began about 2 days after the fall. Since then, he reports poor sleep with PND and 2 pillow orthopnea. He denies any DOE in the past 8 days as he has been able to do yard work and walk 1-2 miles, and he states that his breathing actually seems to improve with activity. Denies chest pain, lightheadedness, dizziness, HA, palpitations, or edema. He has not been on any BP medications for the past 4 months due to financial/insurance issues.   Past Medical History  Diagnosis Date  . Renal insufficiency   . DM type 2 (diabetes mellitus, type 2)   . Ischemic cardiomyopathy   . Nephrolithiasis   . History of MI (myocardial infarction)   . HTN (hypertension)   . Hypercholesteremia   . Coronary atherosclerosis of unspecified type of vessel, native or graft   . Ventricular tachycardia   . MI (myocardial infarction) 2002 X 2   . MI (myocardial infarction) 2006    RCA-T, 70% PL (off CFX), 99% Prox LAD/90% Dist LAD, S/P TAXUS stent x 2    Past Surgical History  Procedure Laterality Date  .  Percutaneous coronary stent intervention (pci-s)  January 2002    PTCA/Stent Distal RCA  . Cardiac defibrillator placement  2007     Medtronic Maximo VR, serial number [id number]  . Percutaneous coronary stent intervention (pci-s)  June 2002    PTCA/Stent x 3 RCA, thrombolysis - failed  . Percutaneous coronary stent intervention (pci-s)  July 2006    TAXUS stents to prox and distal LAD    Allergies  Allergen Reactions  . Penicillins     history   I have reviewed the patient's current medications   Prior to Admission medications   Medication Sig Start Date End Date Taking? Authorizing Provider  aspirin 325 MG tablet Take 325 mg by mouth daily.     Yes Historical Provider, MD  sotalol (BETAPACE) 120 MG tablet Take 1 tablet (120 mg total) by mouth 2 (two) times daily. 07/10/11  Yes Lewayne Bunting, MD  insulin aspart (NOVOLOG) 100 UNIT/ML injection Inject 30 Units into the skin 2 (two) times daily.      Historical Provider, MD  insulin regular (HUMULIN R,NOVOLIN R) 100 UNIT/ML injection Inject 10 Units into the skin 2 (two) times daily before a meal.      Historical Provider, MD  losartan (COZAAR) 50 MG tablet Take 1 tablet (50 mg total) by mouth daily. 07/10/11   Lewayne Bunting, MD  magnesium oxide (MAG-OX) 400 MG tablet Take 1 tablet (400 mg total) by mouth daily.  05/29/10   Lewayne Bunting, MD  metoprolol succinate (TOPROL-XL) 100 MG 24 hr tablet Take 1 tablet (100 mg total) by mouth daily. 07/10/11   Lewayne Bunting, MD  Multiple Vitamin (MULTIVITAMIN) tablet Take 1 tablet by mouth daily.      Historical Provider, MD  rosuvastatin (CRESTOR) 40 MG tablet Take 1 tablet (40 mg total) by mouth daily. 07/10/11   Lewayne Bunting, MD     History   Social History  . Marital Status: Married    Spouse Name: N/A    Number of Children: 3  . 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 Smoking status: Former  Smoker    Quit date: 03/02/2000  . Smokeless tobacco: Not on file  . Alcohol Use: No  . Drug Use: No  . Sexually Active: Not on file   Other Topics Concern  . Not on file   Social History Narrative   Pt lives with wife. He hunts regularly and is active, walking > 1 mile at times without difficulty.    Family Status  Relation Status Death Age  . Father Deceased 65    No CAD  . Mother Alive     64 YO in 2014, No CAD  . Brother Alive     Hx CAD  . Brother Alive     Hx CAD  . Brother Alive     Hx CAD  . Brother Alive     Hx CAD    ROS: Pt has had some nausea. No history of bleeding. Full 14 point review of systems complete and found to be negative unless listed above.  Physical Exam: Blood pressure 100/78, pulse 102, temperature 97.7 F (36.5 C), temperature source Oral, resp. rate 23, SpO2 97.00%.  General: Well developed, well nourished, male in no acute distress Head: Eyes PERRLA, No xanthomas. Normocephalic and atraumatic, oropharynx without edema or exudate. Dentition: poor Lungs: Good air exchange without crackles or wheezes noted. Heart: Heart slightly irregular rate and rhythm with S1, S2  No murmur. pulses are 2+ all 4 extrem.   Neck: No carotid bruits. No lymphadenopathy.  JVD at 9 cm. Abdomen: Bowel sounds present, abdomen soft and non-tender without masses or hernias noted. Msk:  No spine or cva tenderness. No weakness, no joint deformities or effusions. Nontender to palpation of Lspine. Extremities: No clubbing or cyanosis. Trace bilateral lower extremity edema.  Neuro: Alert and oriented X 3. No focal deficits noted. Psych:  Good affect, responds appropriately Skin: No rashes or lesions noted.  Labs:   Lab Results  Component Value Date   WBC 4.5 05/17/2012   HGB 15.6 05/17/2012   HCT 46.0 05/17/2012   MCV 80.4 05/17/2012   PLT 188 05/17/2012   No results found for this basename: INR,  in the last 72 hours   Recent Labs Lab 05/17/12 0938  NA 137  K  4.5  CL 104  BUN 17  CREATININE 1.00  GLUCOSE 264*   No results found for this basename: CKTOTAL, CKMB, TROPONINI,  in the last 72 hours  Recent Labs  05/17/12 0936  TROPIPOC 0.01   Pro B Natriuretic peptide (BNP)  Date/Time Value Range Status  05/17/2012  9:10 AM 770.7* 0 - 125 pg/mL Final   Cardiac Cath: 09/21/2004 RESULTS: The aortic pressure was 102/80, and the left ventricular pressure  was 102/20. The left main coronary artery  was free of significant disease.  The left anterior descending artery gave rise to a moderately large diagonal  branch and three septal perforators. There was 50% narrowing after the  first septal perforator and the first diagonal branch. There was 99%  stenosis after the second septal perforator with TIMI II flow distally.  There was also a long 90% lesion in the distal LAD near the apex. The  circumflex artery gave rise to a small marginal branch, a large marginal  branch, and a posterolateral branch. There was 70% narrowing in the mid-to-  distal circumflex artery before the posterolateral branch. The right  coronary artery was completely occluded proximally after a right ventricular  branch. Previous stents could be easily visualized. The distal right  coronary artery, a small portion of the posterior stenting branch of the  distal right coronary artery filled via collaterals from the left coronary system.  Left ventriculogram with RAO projection showed akinesis of a small area at  the apex. There was hypokinesis of the inferior wall. There was slight  hypokinesis of the anterolateral wall on the anterior wall near the base in  the well. The estimated ejection fraction was 35-40%.  Following stenting of the lesion in the mid LAD, the stenosis improved from  99% to 0%, and the flow improved from TIMI II to TIMI III flow. Following  PTCA of the lesion in the distal LAD, the stenosis improved from 90% to 20%.  CONCLUSION:  1. Acute anterolateral  myocardial infarction, treated with TNK with 99%  stenosis in the mid left anterior descending artery with TIMI II flow,  90% stenosis in the distal left anterior descending artery, 70% stenosis  in the mid-to-distal circumflex artery, and total occlusion of the right  coronary artery with a small area of apical wall akinesis, slight  anterolateral wall hypokinesis, and inferior wall hypokinesis with an  estimated ejection fraction of 35-40%.  2. Successful rescue percutaneous intervention for failed TNK therapy with  improvement in the mid left anterior descending artery lesion from 99%  to 0% and improvement in the lesion in the mid left anterior descending  artery from 99% to 0% using a Taxus drug-eluting stent, improvement in  the lesion in the distal left anterior descending artery from 90% to 20%  with percutaneous transluminal coronary angioplasty and improvement in  the flow from TIMI II to TIMI III flow.  Echo: 09/22/2004 SUMMARY - Overall left ventricular systolic function was moderately decreased. Left ventricular ejection fraction was estimated , range being 30 % to 40 %.. There was akinesis of the entire periapical wall. There was akinesis of the mid-distal septal wall. - There was mild mitral valvular regurgitation. - The left atrium was mildly dilated. IMPRESSIONS - Technically difficult; apex not well visualized; thrombus cannot be excluded.  ECG:  Irregular - atrial flutter with PVCs, no acute ischemic changes, frequent PVCs  Radiology:   Dg Chest 2 View 05/17/2012  *RADIOLOGY REPORT*  Clinical Data: Shortness of breath  CHEST - 2 VIEW  Comparison: 04/25/2010  Findings: Chronic interstitial markings.  Superimposed increased interstitial markings/bronchitic changes.  Vague right upper lobe opacity is possible but considered less likely.  Left basilar atelectasis.  No pleural effusion or pneumothorax.  Cardiomegaly.  Left subclavian ICD.  Degenerative changes of the  visualized thoracolumbar spine.  IMPRESSION: Increased interstitial markings/bronchitic changes.  Vague right upper lobe opacity/pneumonia is possible but considered less likely.   Original Report Authenticated By: Charline Bills, M.D.    Dg  Lumbar Spine 2-3 Views 05/17/2012  *RADIOLOGY REPORT*  Clinical Data: Fall, low back pain  LUMBAR SPINE - 2-3 VIEW  Comparison: None.  Findings: Five lumbar-type vertebral bodies.  Normal lumbar lordosis.  No evidence of fracture or dislocation.  Vertebral body heights are maintained.  Mild to moderate degenerative changes.  Visualized bony pelvis is intact.  IMPRESSION: No fracture or dislocation is seen.  Mild to moderate degenerative changes.   Original Report Authenticated By: Charline Bills, M.D.     ASSESSMENT AND PLAN:   The patient was seen today by Dr [name], the patient evaluated and the data reviewed.   1. Atrial flutter with RVR - new onset with rapid rate but has not been on home meds for months. Will restart sotalol and add Coreg as BP/HR will permit. EP to see in consideration of ablation, which would reduce need for anticoagulation  2.  Orthopnea - has slight volume overload but BP limits diuresis right now. Follow volume status carefully  3.  DIABETES MELLITUS, TYPE II - continue home Rx and add SSI  4.  HYPERCHOLESTEROLEMIA - ck profile and change Lipitor to Pravachol 40 mg for cost savings  5.  HYPERTENSION, BENIGN SYSTEMIC - restart Rx, pt needs $4 meds  6.   CARDIOMYOPATHY, ISCHEMIC - he has a history of left ventricular dysfunction, has increased volume status now;  ck echo, add ACE/BB as BP will allow  7.  VENTRICULAR TACHYCARDIA/AUTOMATIC IMPLANTABLE CARDIAC DEFIBRILLATOR SITU - restart sotalol, interrogate device    Signed: Tandy Gaw  Rhonda Barrett, PA-C 05/17/2012, 1:03 PM  Co-Sign MD As above, patient seen and examined. Briefly he is a 62 year old male with past medical history of coronary artery  disease, ischemic cardiomyopathy, ICD, hypertension, diabetes mellitus, renal insufficiency, hyperlipidemia, ventricular tachycardia presents with atrial flutter and CHF symptoms. Patient states that he has had increasing orthopnea, PND for 2 weeks. He denies dyspnea on exertion, chest pain, palpitations or syncope. On arrival to the emergency room he was noted to be in new onset atrial flutter. BNP minimally elevated. Also note he has not been taking any of his medications at home. Plan to admit. Check echocardiogram and TSH. Begin heparin and Coumadin. (if EP plans ablation, could consider short term xeralto but would need to arrange financial assistance). I will ask EP to evaluate for consideration of ablation. I would like to avoid long-term anticoagulation as he has a history of intermittent noncompliance. We will resume preadmission medications at decreased dose as his blood pressure is borderline. I will resume his sotalol for his ventricular tachycardia and follow QT interval. Resume Toprol at 25 mg daily. Resume Cozaar. Resume statin. Discontinue aspirin as we are adding Coumadin. Olga Millers 2:11 PM

## 2012-05-17 NOTE — ED Notes (Signed)
Pt returned from XR and on monitor.  

## 2012-05-17 NOTE — ED Provider Notes (Signed)
History     CSN: [id number]  Arrival date & time 05/17/12  9528   First MD Initiated Contact with Patient 05/17/12 0827      Chief Complaint  Patient presents with  . Nausea  . Shortness of Breath   HPI  62 y/o male with history of CAD s/p stent (02', 06'), Ischemic cardiomyopathy s/p ICD placement, DM, HTN, HLD who presents with cc of shortness of breath. The patient states that approximately two weeks ago he was hunting racoon when he slipped over a log falling on his back. He states that shortly after that episode he began to develop shortness of breath which has progressively worsened over the last two weeks. He states his symptoms worsen when laying down at night. He denies any fevers, chills, vomiting, lower extremity swelling, palpitations, or chest pain. He denies any worsening of his symptoms with walking. He states he has not been taking his home medications secondary to the inability to afford them.   Past Medical History  Diagnosis Date  . Renal insufficiency   . DM type 2 (diabetes mellitus, type 2)   . Ischemic cardiomyopathy   . Nephrolithiasis   . History of MI (myocardial infarction)   . HTN (hypertension)   . Hypercholesteremia   . Coronary atherosclerosis of unspecified type of vessel, native or graft   . Ventricular tachycardia   . MI (myocardial infarction) 2002 X 2   . MI (myocardial infarction) 2006    RCA-T, 70% PL (off CFX), 99% Prox LAD/90% Dist LAD, S/P TAXUS stent x 2    Past Surgical History  Procedure Laterality Date  . Percutaneous coronary stent intervention (pci-s)  January 2002    PTCA/Stent Distal RCA  . Cardiac defibrillator placement  2007     Medtronic Maximo VR, serial number [id number]  . Percutaneous coronary stent intervention (pci-s)  June 2002    PTCA/Stent x 3 RCA, thrombolysis - failed  . Percutaneous coronary stent intervention (pci-s)  July 2006    TAXUS stents to prox and distal LAD    No family history on file.  History   Substance Use Topics  . Smoking status: Former Smoker    Quit date: 03/02/2000  . Smokeless tobacco: Not on file  . Alcohol Use: No    Review of Systems  Constitutional: Negative for fever and chills.  HENT: Negative for congestion and rhinorrhea.   Respiratory: Positive for shortness of breath. Negative for cough and wheezing.   Cardiovascular: Negative for chest pain.  Gastrointestinal: Positive for nausea. Negative for vomiting and abdominal pain.  Genitourinary: Negative for dysuria and frequency.  Musculoskeletal: Positive for back pain.  Skin: Negative for rash.  All other systems reviewed and are negative.   Allergies  Penicillins  Home Medications   Current Outpatient Rx  Name  Route  Sig  Dispense  Refill  . aspirin 325 MG tablet   Oral   Take 325 mg by mouth daily.           . sotalol (BETAPACE) 120 MG tablet   Oral   Take 1 tablet (120 mg total) by mouth 2 (two) times daily.   180 tablet   3   . insulin aspart (NOVOLOG) 100 UNIT/ML injection   Subcutaneous   Inject 30 Units into the skin 2 (two) times daily.           . insulin regular (HUMULIN R,NOVOLIN R) 100 UNIT/ML injection   Subcutaneous   Inject 10 Units into  the skin 2 (two) times daily before a meal.           . losartan (COZAAR) 50 MG tablet   Oral   Take 1 tablet (50 mg total) by mouth daily.   90 tablet   4   . magnesium oxide (MAG-OX) 400 MG tablet   Oral   Take 1 tablet (400 mg total) by mouth daily.   90 tablet   3   . metoprolol succinate (TOPROL-XL) 100 MG 24 hr tablet   Oral   Take 1 tablet (100 mg total) by mouth daily.   90 tablet   3   . Multiple Vitamin (MULTIVITAMIN) tablet   Oral   Take 1 tablet by mouth daily.           . rosuvastatin (CRESTOR) 40 MG tablet   Oral   Take 1 tablet (40 mg total) by mouth daily.   90 tablet   4     BP 86/71  Pulse 54  Temp(Src) 97.7 F (36.5 C) (Oral)  Resp 18  SpO2 100%  Physical Exam  Nursing note and vitals  reviewed. Constitutional: He is oriented to person, place, and time. He appears well-developed and well-nourished. No distress.  HENT:  Head: Normocephalic and atraumatic.  Eyes: Conjunctivae are normal. Pupils are equal, round, and reactive to light.  Neck: Normal range of motion. Neck supple.  Cardiovascular: An irregular rhythm present. Exam reveals no gallop and no friction rub.   Murmur heard.  Decrescendo systolic murmur is present with a grade of 2/6  Pulmonary/Chest: Effort normal and breath sounds normal.  Abdominal: Soft. Bowel sounds are normal.  Musculoskeletal: Normal range of motion. He exhibits no edema and no tenderness.  Neurological: He is alert and oriented to person, place, and time.  Skin: Skin is warm and dry.  Psychiatric: He has a normal mood and affect.    ED Course  Procedures (including critical care time)  Labs Reviewed  PRO B NATRIURETIC PEPTIDE - Abnormal; Notable for the following:    Pro B Natriuretic peptide (BNP) 770.7 (*)    All other components within normal limits  GLUCOSE, CAPILLARY - Abnormal; Notable for the following:    Glucose-Capillary 236 (*)    All other components within normal limits  POCT I-STAT, CHEM 8 - Abnormal; Notable for the following:    Glucose, Bld 264 (*)    All other components within normal limits  CBC WITH DIFFERENTIAL  HEMOGLOBIN A1C  POCT I-STAT TROPONIN I   Dg Chest 2 View  05/17/2012  *RADIOLOGY REPORT*  Clinical Data: Shortness of breath  CHEST - 2 VIEW  Comparison: 04/25/2010  Findings: Chronic interstitial markings.  Superimposed increased interstitial markings/bronchitic changes.  Vague right upper lobe opacity is possible but considered less likely.  Left basilar atelectasis.  No pleural effusion or pneumothorax.  Cardiomegaly.  Left subclavian ICD.  Degenerative changes of the visualized thoracolumbar spine.  IMPRESSION: Increased interstitial markings/bronchitic changes.  Vague right upper lobe  opacity/pneumonia is possible but considered less likely.   Original Report Authenticated By: Charline Bills, M.D.    Dg Lumbar Spine 2-3 Views  05/17/2012  *RADIOLOGY REPORT*  Clinical Data: Fall, low back pain  LUMBAR SPINE - 2-3 VIEW  Comparison: None.  Findings: Five lumbar-type vertebral bodies.  Normal lumbar lordosis.  No evidence of fracture or dislocation.  Vertebral body heights are maintained.  Mild to moderate degenerative changes.  Visualized bony pelvis is intact.  IMPRESSION: No fracture or  dislocation is seen.  Mild to moderate degenerative changes.   Original Report Authenticated By: Charline Bills, M.D.      1. Dyspnea   2. Atrial flutter   3. Atrial flutter with rapid ventricular response      Date: 05/17/2012  Rate: 112  Rhythm: atrial flutter  QRS Axis: left  Intervals: Unable to determine  ST/T Wave abnormalities: nonspecific ST changes  Conduction Disutrbances:PVC's  Narrative Interpretation:   Old EKG Reviewed: Now in atrial flutter   MDM  62 y/o male with history of CAD s/p stent (02', 06'), Ischemic cardiomyopathy s/p ICD placement, DM, HTN, HLD who presents with cc of shortness of breath. EKG with new onset atrial flutter. Home dose of sotalol and metoprolol given. CXR without acute pathology. Doubt CHF. No risk factors for PE and feel history is not c/w PE. Initial troponin negative. Hemoglobin stable. CXR not c/w pneumonia, dissection, or pneumothorax. Will admit to cardiology for continued management.         Shanon Ace, MD 05/17/12 1534

## 2012-05-17 NOTE — ED Notes (Signed)
Charge RN on 4700 was called and given report on pt condition at this time

## 2012-05-17 NOTE — Progress Notes (Signed)
ANTICOAGULATION CONSULT NOTE - Initial Consult  Pharmacy Consult for Heparin Indication: new onset Aflutter with RVR  Allergies  Allergen Reactions  . Penicillins     history    Patient Measurements: Height: 5\' 11"  (180.3 cm) Weight: 228 lb 9.6 oz (103.692 kg) IBW/kg (Calculated) : 75.3 Heparin Dosing Weight: 97 kg  Vital Signs: Temp: 97.9 F (36.6 C) (03/18 1612) Temp src: Oral (03/18 1612) BP: 98/73 mmHg (03/18 1612) Pulse Rate: 104 (03/18 1612)  Labs:  Recent Labs  05/17/12 0910 05/17/12 0938  HGB 14.4 15.6  HCT 42.6 46.0  PLT 188  --   CREATININE  --  1.00    Estimated Creatinine Clearance: 93.9 ml/min (by C-G formula based on Cr of 1).   Medical History: Past Medical History  Diagnosis Date  . Renal insufficiency   . DM type 2 (diabetes mellitus, type 2)   . Ischemic cardiomyopathy   . Nephrolithiasis   . History of MI (myocardial infarction)   . HTN (hypertension)   . Hypercholesteremia   . Coronary atherosclerosis of unspecified type of vessel, native or graft   . Ventricular tachycardia   . MI (myocardial infarction) 2002 X 2   . MI (myocardial infarction) 2006    RCA-T, 70% PL (off CFX), 99% Prox LAD/90% Dist LAD, S/P TAXUS stent x 2    Medications:  Prescriptions prior to admission  Medication Sig Dispense Refill  . aspirin 325 MG tablet Take 325 mg by mouth daily.        . sotalol (BETAPACE) 120 MG tablet Take 1 tablet (120 mg total) by mouth 2 (two) times daily.  180 tablet  3  . insulin aspart (NOVOLOG) 100 UNIT/ML injection Inject 30 Units into the skin 2 (two) times daily.        . insulin regular (HUMULIN R,NOVOLIN R) 100 UNIT/ML injection Inject 10 Units into the skin 2 (two) times daily before a meal.        . losartan (COZAAR) 50 MG tablet Take 1 tablet (50 mg total) by mouth daily.  90 tablet  4  . magnesium oxide (MAG-OX) 400 MG tablet Take 1 tablet (400 mg total) by mouth daily.  90 tablet  3  . metoprolol succinate (TOPROL-XL)  100 MG 24 hr tablet Take 1 tablet (100 mg total) by mouth daily.  90 tablet  3  . Multiple Vitamin (MULTIVITAMIN) tablet Take 1 tablet by mouth daily.        . rosuvastatin (CRESTOR) 40 MG tablet Take 1 tablet (40 mg total) by mouth daily.  90 tablet  4    Assessment: 62 y/o male who presents to the ED with shortness of breath that has progressively increased since his fall about 2 weeks ago. His symptoms are worse while lying down. He has not been on any of his home medications in the last 4 months due to financial reasons and has a history of intermittent non-compliance. In the ED he was found to be in Aflutter. Pharmacy consulted to begin a heparin drip. Renal function and CBC are normal at baseline.  Goal of Therapy:  Heparin level 0.3-0.7 units/ml Monitor platelets by anticoagulation protocol: Yes   Plan:  -Begin heparin with a 4000 unit IV bolus then infuse at 1450 units/hr -Heparin level 6 hours after started -Daily heparin level and CBC while on heparin -Monitor for signs/symptoms of bleeding  [facility], [place].D., BCPS Clinical Pharmacist Pager: [phone] 05/17/2012 4:34 PM

## 2012-05-17 NOTE — ED Notes (Signed)
Family at bedside. 

## 2012-05-17 NOTE — ED Notes (Signed)
MD at bedside. 

## 2012-05-17 NOTE — Progress Notes (Signed)
Utilization review completed.  P.J. Harlyn Rathmann,RN,BSN Case Manager [phone]  

## 2012-05-17 NOTE — ED Notes (Signed)
Patient transported to X-ray 

## 2012-05-17 NOTE — ED Notes (Signed)
Cardiology aware of pt BP.

## 2012-05-17 NOTE — ED Notes (Signed)
Pt with c/o nausea and SOB that has been over last 2 weeks. Pt reports it happens when he lays down at night. Pt has cardiac hx.

## 2012-05-17 NOTE — ED Notes (Addendum)
Pt has hx of ventricular tachycardia with an ICD. Pt wife reports pt fell 3 weeks ago while hunting in the dark and has had c/o back pain.

## 2012-05-18 ENCOUNTER — Encounter (HOSPITAL_COMMUNITY): Payer: Self-pay | Admitting: Internal Medicine

## 2012-05-18 ENCOUNTER — Inpatient Hospital Stay (HOSPITAL_COMMUNITY): Payer: MEDICAID

## 2012-05-18 DIAGNOSIS — R110 Nausea: Secondary | ICD-10-CM

## 2012-05-18 DIAGNOSIS — D68318 Other hemorrhagic disorder due to intrinsic circulating anticoagulants, antibodies, or inhibitors: Secondary | ICD-10-CM

## 2012-05-18 DIAGNOSIS — I059 Rheumatic mitral valve disease, unspecified: Secondary | ICD-10-CM

## 2012-05-18 DIAGNOSIS — I2589 Other forms of chronic ischemic heart disease: Secondary | ICD-10-CM

## 2012-05-18 DIAGNOSIS — I2510 Atherosclerotic heart disease of native coronary artery without angina pectoris: Secondary | ICD-10-CM

## 2012-05-18 DIAGNOSIS — R0989 Other specified symptoms and signs involving the circulatory and respiratory systems: Secondary | ICD-10-CM

## 2012-05-18 DIAGNOSIS — R112 Nausea with vomiting, unspecified: Secondary | ICD-10-CM

## 2012-05-18 DIAGNOSIS — I472 Ventricular tachycardia: Secondary | ICD-10-CM

## 2012-05-18 LAB — COMPREHENSIVE METABOLIC PANEL
AST: 23 U/L (ref 0–37)
Albumin: 3.1 g/dL — ABNORMAL LOW (ref 3.5–5.2)
Chloride: 100 mEq/L (ref 96–112)
Creatinine, Ser: 1.03 mg/dL (ref 0.50–1.35)
Potassium: 4.3 mEq/L (ref 3.5–5.1)
Total Bilirubin: 1 mg/dL (ref 0.3–1.2)
Total Protein: 7 g/dL (ref 6.0–8.3)

## 2012-05-18 LAB — CBC
HCT: 41.7 % (ref 39.0–52.0)
HCT: 41.7 % (ref 39.0–52.0)
Hemoglobin: 14 g/dL (ref 13.0–17.0)
MCH: 27.6 pg (ref 26.0–34.0)
MCV: 81.3 fL (ref 78.0–100.0)
MCV: 81.1 fL (ref 78.0–100.0)
Platelets: 198 10*3/uL (ref 150–400)
RBC: 5.13 MIL/uL (ref 4.22–5.81)
RBC: 5.14 MIL/uL (ref 4.22–5.81)
RDW: 13.9 % (ref 11.5–15.5)
WBC: 4.9 10*3/uL (ref 4.0–10.5)

## 2012-05-18 LAB — LIPID PANEL
Cholesterol: 159 mg/dL (ref 0–200)
HDL: 36 mg/dL — ABNORMAL LOW (ref >39)
Total CHOL/HDL Ratio: 4.4 RATIO
Triglycerides: 53 mg/dL (ref <150)

## 2012-05-18 LAB — GLUCOSE, CAPILLARY
Glucose-Capillary: 220 mg/dL — ABNORMAL HIGH (ref 70–99)
Glucose-Capillary: 162 mg/dL — ABNORMAL HIGH (ref 70–99)

## 2012-05-18 LAB — TROPONIN I
Troponin I: <0.3 ng/mL (ref <0.30)
Troponin I: <0.3 ng/mL (ref <0.30)

## 2012-05-18 MED ORDER — PANTOPRAZOLE SODIUM 40 MG PO TBEC
40.0000 mg | DELAYED_RELEASE_TABLET | Freq: Every day | ORAL | Status: DC
  Administered 2012-05-19 – 2012-05-20 (×2): 40 mg via ORAL
  Filled 2012-05-18 (×2): qty 1

## 2012-05-18 MED ORDER — TRAMADOL HCL 50 MG PO TABS
50.0000 mg | ORAL_TABLET | Freq: Two times a day (BID) | ORAL | Status: DC | PRN
  Filled 2012-05-18 (×2): qty 1

## 2012-05-18 MED ORDER — HEPARIN (PORCINE) IN NACL 100-0.45 UNIT/ML-% IJ SOLN
1250.0000 [IU]/h | INTRAMUSCULAR | Status: DC
  Administered 2012-05-19: 1100 [IU]/h via INTRAVENOUS
  Administered 2012-05-19: 1250 [IU]/h via INTRAVENOUS
  Filled 2012-05-18 (×3): qty 250

## 2012-05-18 MED ORDER — LOSARTAN POTASSIUM 25 MG PO TABS
25.0000 mg | ORAL_TABLET | Freq: Every day | ORAL | Status: DC
  Administered 2012-05-18 – 2012-05-20 (×3): 25 mg via ORAL
  Filled 2012-05-18 (×3): qty 1

## 2012-05-18 MED ORDER — SODIUM CHLORIDE 0.9 % IV BOLUS (SEPSIS)
200.0000 mL | Freq: Once | INTRAVENOUS | Status: AC
  Administered 2012-05-18: 200 mL via INTRAVENOUS

## 2012-05-18 MED ORDER — PROMETHAZINE HCL 25 MG/ML IJ SOLN
12.5000 mg | Freq: Four times a day (QID) | INTRAMUSCULAR | Status: DC | PRN
  Administered 2012-05-18: 12.5 mg via INTRAVENOUS
  Filled 2012-05-18 (×2): qty 1

## 2012-05-18 MED ORDER — IOHEXOL 300 MG/ML  SOLN
80.0000 mL | Freq: Once | INTRAMUSCULAR | Status: AC | PRN
  Administered 2012-05-18: 80 mL via INTRAVENOUS

## 2012-05-18 NOTE — Consult Note (Signed)
ELECTROPHYSIOLOGY CONSULT NOTE  Patient ID: [name] [name] MRN: [id number], DOB/AGE: 1950-11-15   Admit date: 05/17/2012 Date of Consult: 05/18/2012  Primary Physician: Aida Puffer, MD Primary Cardiologist: Olga Millers, MD Reason for Consultation: Atrial flutter  History of Present Illness [name] [name] is a 62 year old man with an ischemic CM s/p ICD, paroxysmal VT on sotalol, chronic systolic HF, CAD, HTN, DM and CKD who presented to the ED c/o back pain after a fall while hunting; however, on presentation he also reported orthopnea and PND, found to have new onset atrial flutter. [name] [name] reports persistent 2-pillow orthopnea and PND x 1 week. He denies exertional dyspnea or chest pain. In face, he reports his activity/energy level has been "fine" like his usual self. He denies palpitations, dizziness or syncope. He denies LE or abdominal swelling. He denies ICD shocks. He reports noncompliance with his medications due to financial constraints and has been unable to afford them for the past 4 months. On admission his 12-lead ECG shows typical appearing atrial flutter at 112 bpm. Telemetry shows persistent atrial flutter that is overall rate controlled with occasional PVCs. His CEs are negative.  Past Medical History Past Medical History  Diagnosis Date  . DM type 2 (diabetes mellitus, type 2)   . Ischemic cardiomyopathy   . History of MI (myocardial infarction)   . HTN (hypertension)   . Hypercholesteremia   . Coronary atherosclerosis of unspecified type of vessel, native or graft   . Ventricular tachycardia   . MI (myocardial infarction) 2002 X 2   . MI (myocardial infarction) 2006    RCA-T, 70% PL (off CFX), 99% Prox LAD/90% Dist LAD, S/P TAXUS stent x 2  . Anginal pain   . ICD (implantable cardiac defibrillator) in place   . Shortness of breath   . Renal insufficiency   . Nephrolithiasis     Past Surgical History Past Surgical History  Procedure Laterality Date  .  Percutaneous coronary stent intervention (pci-s)  January 2002    PTCA/Stent Distal RCA  . Cardiac defibrillator placement  2007     Medtronic Maximo VR, serial number [id number]  . Percutaneous coronary stent intervention (pci-s)  June 2002    PTCA/Stent x 3 RCA, thrombolysis - failed  . Percutaneous coronary stent intervention (pci-s)  July 2006    TAXUS stents to prox and distal LAD     Allergies/Intolerances Allergies  Allergen Reactions  . Penicillins     history    Inpatient Medications . carvedilol  3.125 mg Oral BID WC  . insulin aspart  0-15 Units Subcutaneous TID WC  . insulin aspart  0-5 Units Subcutaneous QHS  . losartan  25 mg Oral Daily  . magnesium oxide  400 mg Oral Daily  . multivitamin  1 tablet Oral Daily  . simvastatin  40 mg Oral q1800  . sodium chloride  3 mL Intravenous Q12H  . sotalol  120 mg Oral BID   . heparin 1,300 Units/hr (05/18/12 1191)    Family History Positive for CAD in 4 brothers   Social History Social History  . 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 Smoking status: Former Smoker    Quit date: 03/02/2000  . Smokeless tobacco: No  . Alcohol Use: No  . Drug Use: No   Social History Narrative   Pt lives with wife. He hunts regularly and is active, walking >  1 mile at times without difficulty.    Review of Systems General: No chills, fever, night sweats or weight changes  Cardiovascular:  No chest pain, dyspnea on exertion, edema, palpitations Dermatological: No rash, lesions or masses Respiratory: No cough Urologic: No hematuria, dysuria Abdominal: No nausea, vomiting, diarrhea, bright red blood per rectum, melena, or hematemesis Neurologic: No visual changes, weakness, changes in mental status All other systems reviewed and are otherwise negative except as noted above.  Physical Exam Blood pressure 96/59, pulse 97, temperature 97.3 F (36.3  C), temperature source Oral, resp. rate 20, height 5\' 11"  (1.803 m), weight 228 lb 1.6 oz (103.465 kg), SpO2 98.00%.  General: Well developed, well appearing 62 year old male in no acute distress. HEENT: Normocephalic, atraumatic. EOMs intact. Sclera nonicteric. Oropharynx clear.  Neck: Supple without bruits. No JVD. Lungs: Respirations regular and unlabored, CTA bilaterally. No wheezes, rales or rhonchi. Heart: Regular S1, S2. No murmurs, rub, S3 or S4. Abdomen: Soft, non-tender, non-distended. BS present x 4 quadrants. No hepatosplenomegaly.  Extremities: No clubbing or cyanosis. Trace pedal edema bilaterally. DP/PT/radials 2+ and equal bilaterally. Psych: Normal affect. Neuro: Alert and oriented X 3. Moves all extremities spontaneously. Musculoskeletal: No kyphosis. Skin: Intact. Warm and dry. No rashes or petechiae in exposed areas.   Labs  Recent Labs  05/17/12 1618 05/17/12 2321 05/18/12 0536  TROPONINI <0.30 <0.30 <0.30   Lab Results  Component Value Date   WBC 4.9 05/18/2012   HGB 14.0 05/18/2012   HCT 41.7 05/18/2012   MCV 81.3 05/18/2012   PLT 180 05/18/2012     Recent Labs Lab 05/18/12 0536  NA 134*  K 4.3  CL 100  CO2 21  BUN 21  CREATININE 1.03  CALCIUM 9.4  PROT 7.0  BILITOT 1.0  ALKPHOS 68  ALT 16  AST 23  GLUCOSE 172*   Lab Results  Component Value Date   CHOL 159 05/18/2012   HDL 36* 05/18/2012   LDLCALC 112* 05/18/2012   TRIG 53 05/18/2012   Recent Labs  05/17/12 1616  TSH 3.534    Recent Labs  05/17/12 1616  INR 1.19    Radiology/Studies Dg Chest 2 View  05/17/2012  *RADIOLOGY REPORT*  Clinical Data: Shortness of breath  CHEST - 2 VIEW  Comparison: 04/25/2010  Findings: Chronic interstitial markings.  Superimposed increased interstitial markings/bronchitic changes.  Vague right upper lobe opacity is possible but considered less likely.  Left basilar atelectasis.  No pleural effusion or pneumothorax.  Cardiomegaly.  Left subclavian ICD.   Degenerative changes of the visualized thoracolumbar spine.  IMPRESSION: Increased interstitial markings/bronchitic changes.  Vague right upper lobe opacity/pneumonia is possible but considered less likely.   Original Report Authenticated By: Charline Bills, M.D.     Echocardiogram  pending  12-lead ECG on admission shows typical appearing atrial flutter at 112 bpm; nonspecific IVCD - QRS 110 msec; 2 PVCs Telemetry shows persistent atrial flutter, rate controlled; occasional PVCs   Assessment and Plan 1. Atrial flutter, newly diagnosed 2. Acute on chronic systolic HF 3. Ischemic CM s/p ICD 4. Paroxysmal VT [name] [name] has persistent atrial flutter, newly diagnosed of unknown duration. Treatment options for atrial flutter were reviewed with him and his wife in detail, including medical therapy versus EPS +RF ablation. He is a candidate for EPS +RF ablation which is potentially a curative treatment option in 90-95% of cases of typical atrial flutter. This is probably the best option for [name] [name] since he would like to avoid  long term anticoagulation given his financial constraints and noncompliance. Risk, benefits, and alternatives to EP study and radiofrequency ablation were discussed in detail. These risks include but are not limited to stroke, bleeding, vascular damage, tamponade, perforation or damage to the heart and other structures and death. He will need a TEE prior to EPS +RF ablation to r/o LA/LAA thrombus. He will also require short term anticoagulation and we can probably provide samples for him. Dr. Graciela Husbands to see.  Signed, Rick Duff, PA-C 05/18/2012, 11:28 AM   The pt has asymptomatic aflutte with CHADSVASc >/=4  Presented to hospital w back pain and has had persistent nausea and BP soft (normal  140s now 95) NO abd pain but on heparin.    We discussed the role of RFCA for flutter as an alternative to cardioversion and medical therapy and the possibility of atrial  fibrillation requiring anticoagulation in the future even if RFCA successful  However, the nausea low bp and recent trauma invites the question of acute intrabdominal process and have spoken with Trauma surgery who suggested holding heparin for now, recheck CBC and CT for abd.  If the GI issue resolves, would resume heparin and pursue TEE DCCV in am  Reviewed extensively with family

## 2012-05-18 NOTE — Consult Note (Signed)
Reason for Consult:Persistent nausea after fall Referring Physician: Sherryl Manges, MD  [name] [name] is an 62 y.o. male.  HPI: This is a 62 yo male with DM, ischemic cardiomyopathy, history of MI and ventricular tachycardia, admitted yesterday by Cardiology for atrial flutter with rapid ventricular response.  The patient complains of persistent nausea that has been occurring since a fall about three weeks ago.  The patient was out "coon hunting" and tripped backwards over a log.  He landed on his back, but got up immediately.  He denies any loss of consciousness, but states that he was wearing a light on his hat and the light was broken.  It is unclear whether he landed on the back of his head.  He denies any hematoma in this area, but did have some bruising across his lower back.  Since that time, he has had episodes of nausea, but no vomiting.  Normal bowel movements.  His main complaint is the nausea.  He is currently heparinized.  His blood pressure has been running low since admission, but the patient states that he was not taking his Cozaar prior to admission.  He has been on this since admission and thinks that is the reason for his hypotension.  Past Medical History  Diagnosis Date  . DM type 2 (diabetes mellitus, type 2)   . Ischemic cardiomyopathy   . HTN (hypertension)   . Hypercholesteremia   . Ventricular tachycardia   . MI (myocardial infarction) 4098,1191 X 2     RCA-T, 70% PL (off CFX), 99% Prox LAD/90% Dist LAD, S/P TAXUS stent x 2  . Anginal pain   . ICD (implantable cardiac defibrillator) in place   . Shortness of breath   . Renal insufficiency   . Nephrolithiasis     Past Surgical History  Procedure Laterality Date  . Percutaneous coronary stent intervention (pci-s)  January 2002    PTCA/Stent Distal RCA  . Cardiac defibrillator placement  2007     Medtronic Maximo VR, serial number [id number]  . Percutaneous coronary stent intervention (pci-s)  June 2002   PTCA/Stent x 3 RCA, thrombolysis - failed  . Percutaneous coronary stent intervention (pci-s)  July 2006    TAXUS stents to prox and distal LAD    History reviewed. No pertinent family history.  Social History:  reports that he quit smoking about 12 years ago. He does not have any smokeless tobacco history on file. He reports that he does not drink alcohol or use illicit drugs.  Allergies:  Allergies  Allergen Reactions  . Penicillins     history    Medications:  Prior to Admission:  Prescriptions prior to admission  Medication Sig Dispense Refill  . aspirin 325 MG tablet Take 325 mg by mouth daily.        . sotalol (BETAPACE) 120 MG tablet Take 1 tablet (120 mg total) by mouth 2 (two) times daily.  180 tablet  3  . insulin aspart (NOVOLOG) 100 UNIT/ML injection Inject 30 Units into the skin 2 (two) times daily.        . insulin regular (HUMULIN R,NOVOLIN R) 100 UNIT/ML injection Inject 10 Units into the skin 2 (two) times daily before a meal.        . losartan (COZAAR) 50 MG tablet Take 1 tablet (50 mg total) by mouth daily.  90 tablet  4  . magnesium oxide (MAG-OX) 400 MG tablet Take 1 tablet (400 mg total) by mouth daily.  90 tablet  3  . metoprolol succinate (TOPROL-XL) 100 MG 24 hr tablet Take 1 tablet (100 mg total) by mouth daily.  90 tablet  3  . Multiple Vitamin (MULTIVITAMIN) tablet Take 1 tablet by mouth daily.        . rosuvastatin (CRESTOR) 40 MG tablet Take 1 tablet (40 mg total) by mouth daily.  90 tablet  4    Results for orders placed during the hospital encounter of 05/17/12 (from the past 48 hour(s))  CBC WITH DIFFERENTIAL     Status: None   Collection Time    05/17/12  9:10 AM      Result Value Range   WBC 4.5  4.0 - 10.5 K/uL   RBC 5.30  4.22 - 5.81 MIL/uL   Hemoglobin 14.4  13.0 - 17.0 g/dL   HCT 16.1  09.6 - 04.5 %   MCV 80.4  78.0 - 100.0 fL   MCH 27.2  26.0 - 34.0 pg   MCHC 33.8  30.0 - 36.0 g/dL   RDW 40.9  81.1 - 91.4 %   Platelets 188  150 - 400  K/uL   Neutrophils Relative 50  43 - 77 %   Neutro Abs 2.2  1.7 - 7.7 K/uL   Lymphocytes Relative 35  12 - 46 %   Lymphs Abs 1.6  0.7 - 4.0 K/uL   Monocytes Relative 12  3 - 12 %   Monocytes Absolute 0.6  0.1 - 1.0 K/uL   Eosinophils Relative 2  0 - 5 %   Eosinophils Absolute 0.1  0.0 - 0.7 K/uL   Basophils Relative 1  0 - 1 %   Basophils Absolute 0.0  0.0 - 0.1 K/uL  PRO B NATRIURETIC PEPTIDE     Status: Abnormal   Collection Time    05/17/12  9:10 AM      Result Value Range   Pro B Natriuretic peptide (BNP) 770.7 (*) 0 - 125 pg/mL  HEMOGLOBIN A1C     Status: Abnormal   Collection Time    05/17/12  9:10 AM      Result Value Range   Hemoglobin A1C 11.5 (*) <5.7 %   Comment: (NOTE)                                                                               According to the ADA Clinical Practice Recommendations for 2011, when     HbA1c is used as a screening test:      >=6.5%   Diagnostic of Diabetes Mellitus               (if abnormal result is confirmed)     5.7-6.4%   Increased risk of developing Diabetes Mellitus     References:Diagnosis and Classification of Diabetes Mellitus,Diabetes     Care,2011,34(Suppl 1):S62-S69 and Standards of Medical Care in             Diabetes - 2011,Diabetes Care,2011,34 (Suppl 1):S11-S61.   Mean Plasma Glucose 283 (*) <117 mg/dL  POCT I-STAT TROPONIN I     Status: None   Collection Time    05/17/12  9:36 AM      Result Value Range  Troponin i, poc 0.01  0.00 - 0.08 ng/mL   Comment 3            Comment: Due to the release kinetics of cTnI,     a negative result within the first hours     of the onset of symptoms does not rule out     myocardial infarction with certainty.     If myocardial infarction is still suspected,     repeat the test at appropriate intervals.  POCT I-STAT, CHEM 8     Status: Abnormal   Collection Time    05/17/12  9:38 AM      Result Value Range   Sodium 137  135 - 145 mEq/L   Potassium 4.5  3.5 - 5.1 mEq/L    Chloride 104  96 - 112 mEq/L   BUN 17  6 - 23 mg/dL   Creatinine, Ser 1.61  0.50 - 1.35 mg/dL   Glucose, Bld 096 (*) 70 - 99 mg/dL   Calcium, Ion 0.45  4.09 - 1.30 mmol/L   TCO2 25  0 - 100 mmol/L   Hemoglobin 15.6  13.0 - 17.0 g/dL   HCT 81.1  91.4 - 78.2 %  GLUCOSE, CAPILLARY     Status: Abnormal   Collection Time    05/17/12  2:34 PM      Result Value Range   Glucose-Capillary 236 (*) 70 - 99 mg/dL   Comment 1 Documented in Chart     Comment 2 Notify RN    GLUCOSE, CAPILLARY     Status: Abnormal   Collection Time    05/17/12  4:07 PM      Result Value Range   Glucose-Capillary 285 (*) 70 - 99 mg/dL  PROTIME-INR     Status: None   Collection Time    05/17/12  4:16 PM      Result Value Range   Prothrombin Time 14.9  11.6 - 15.2 seconds   INR 1.19  0.00 - 1.49  APTT     Status: None   Collection Time    05/17/12  4:16 PM      Result Value Range   aPTT 31  24 - 37 seconds  TSH     Status: None   Collection Time    05/17/12  4:16 PM      Result Value Range   TSH 3.534  0.350 - 4.500 uIU/mL  TROPONIN I     Status: None   Collection Time    05/17/12  4:18 PM      Result Value Range   Troponin I <0.30  <0.30 ng/mL   Comment:            Due to the release kinetics of cTnI,     a negative result within the first hours     of the onset of symptoms does not rule out     myocardial infarction with certainty.     If myocardial infarction is still suspected,     repeat the test at appropriate intervals.  GLUCOSE, CAPILLARY     Status: Abnormal   Collection Time    05/17/12  9:03 PM      Result Value Range   Glucose-Capillary 130 (*) 70 - 99 mg/dL   Comment 1 Notify RN     Comment 2 Documented in Chart    TROPONIN I     Status: None   Collection Time    05/17/12 11:21 PM  Result Value Range   Troponin I <0.30  <0.30 ng/mL   Comment:            Due to the release kinetics of cTnI,     a negative result within the first hours     of the onset of symptoms does not  rule out     myocardial infarction with certainty.     If myocardial infarction is still suspected,     repeat the test at appropriate intervals.  HEPARIN LEVEL (UNFRACTIONATED)     Status: None   Collection Time    05/17/12 11:21 PM      Result Value Range   Heparin Unfractionated 0.60  0.30 - 0.70 IU/mL   Comment:            IF HEPARIN RESULTS ARE BELOW     EXPECTED VALUES, AND PATIENT     DOSAGE HAS BEEN CONFIRMED,     SUGGEST FOLLOW UP TESTING     OF ANTITHROMBIN III LEVELS.  TROPONIN I     Status: None   Collection Time    05/18/12  5:36 AM      Result Value Range   Troponin I <0.30  <0.30 ng/mL   Comment:            Due to the release kinetics of cTnI,     a negative result within the first hours     of the onset of symptoms does not rule out     myocardial infarction with certainty.     If myocardial infarction is still suspected,     repeat the test at appropriate intervals.  COMPREHENSIVE METABOLIC PANEL     Status: Abnormal   Collection Time    05/18/12  5:36 AM      Result Value Range   Sodium 134 (*) 135 - 145 mEq/L   Potassium 4.3  3.5 - 5.1 mEq/L   Chloride 100  96 - 112 mEq/L   CO2 21  19 - 32 mEq/L   Glucose, Bld 172 (*) 70 - 99 mg/dL   BUN 21  6 - 23 mg/dL   Creatinine, Ser 2.95  0.50 - 1.35 mg/dL   Calcium 9.4  8.4 - 62.1 mg/dL   Total Protein 7.0  6.0 - 8.3 g/dL   Albumin 3.1 (*) 3.5 - 5.2 g/dL   AST 23  0 - 37 U/L   ALT 16  0 - 53 U/L   Alkaline Phosphatase 68  39 - 117 U/L   Total Bilirubin 1.0  0.3 - 1.2 mg/dL   GFR calc non Af Amer 76 (*) >90 mL/min   GFR calc Af Amer 88 (*) >90 mL/min   Comment:            The eGFR has been calculated     using the CKD EPI equation.     This calculation has not been     validated in all clinical     situations.     eGFR's persistently     <90 mL/min signify     possible Chronic Kidney Disease.  HEPARIN LEVEL (UNFRACTIONATED)     Status: Abnormal   Collection Time    05/18/12  5:36 AM      Result Value  Range   Heparin Unfractionated 0.82 (*) 0.30 - 0.70 IU/mL   Comment:            IF HEPARIN RESULTS ARE BELOW     EXPECTED VALUES,  AND PATIENT     DOSAGE HAS BEEN CONFIRMED,     SUGGEST FOLLOW UP TESTING     OF ANTITHROMBIN III LEVELS.  CBC     Status: None   Collection Time    05/18/12  5:36 AM      Result Value Range   WBC 4.9  4.0 - 10.5 K/uL   RBC 5.13  4.22 - 5.81 MIL/uL   Hemoglobin 14.0  13.0 - 17.0 g/dL   HCT 95.2  84.1 - 32.4 %   MCV 81.3  78.0 - 100.0 fL   MCH 27.3  26.0 - 34.0 pg   MCHC 33.6  30.0 - 36.0 g/dL   RDW 40.1  02.7 - 25.3 %   Platelets 180  150 - 400 K/uL  LIPID PANEL     Status: Abnormal   Collection Time    05/18/12  5:36 AM      Result Value Range   Cholesterol 159  0 - 200 mg/dL   Triglycerides 53  <664 mg/dL   HDL 36 (*) >40 mg/dL   Total CHOL/HDL Ratio 4.4     VLDL 11  0 - 40 mg/dL   LDL Cholesterol 347 (*) 0 - 99 mg/dL   Comment:            Total Cholesterol/HDL:CHD Risk     Coronary Heart Disease Risk Table                         Men   Women      1/2 Average Risk   3.4   3.3      Average Risk       5.0   4.4      2 X Average Risk   9.6   7.1      3 X Average Risk  23.4   11.0                Use the calculated Patient Ratio     above and the CHD Risk Table     to determine the patient's CHD Risk.                ATP III CLASSIFICATION (LDL):      <100     mg/dL   Optimal      425-956  mg/dL   Near or Above                        Optimal      130-159  mg/dL   Borderline      387-564  mg/dL   High      >332     mg/dL   Very High  GLUCOSE, CAPILLARY     Status: Abnormal   Collection Time    05/18/12  5:57 AM      Result Value Range   Glucose-Capillary 158 (*) 70 - 99 mg/dL   Comment 1 Documented in Chart     Comment 2 Notify RN    GLUCOSE, CAPILLARY     Status: Abnormal   Collection Time    05/18/12 10:49 AM      Result Value Range   Glucose-Capillary 220 (*) 70 - 99 mg/dL   Comment 1 Documented in Chart     Comment 2 Notify RN     HEPARIN LEVEL (UNFRACTIONATED)     Status: Abnormal   Collection Time    05/18/12  1:05 PM      Result Value Range   Heparin Unfractionated 0.73 (*) 0.30 - 0.70 IU/mL   Comment:            IF HEPARIN RESULTS ARE BELOW     EXPECTED VALUES, AND PATIENT     DOSAGE HAS BEEN CONFIRMED,     SUGGEST FOLLOW UP TESTING     OF ANTITHROMBIN III LEVELS.  GLUCOSE, CAPILLARY     Status: Abnormal   Collection Time    05/18/12  4:17 PM      Result Value Range   Glucose-Capillary 162 (*) 70 - 99 mg/dL   Comment 1 Documented in Chart     Comment 2 Notify RN      Dg Chest 2 View  05/17/2012  *RADIOLOGY REPORT*  Clinical Data: Shortness of breath  CHEST - 2 VIEW  Comparison: 04/25/2010  Findings: Chronic interstitial markings.  Superimposed increased interstitial markings/bronchitic changes.  Vague right upper lobe opacity is possible but considered less likely.  Left basilar atelectasis.  No pleural effusion or pneumothorax.  Cardiomegaly.  Left subclavian ICD.  Degenerative changes of the visualized thoracolumbar spine.  IMPRESSION: Increased interstitial markings/bronchitic changes.  Vague right upper lobe opacity/pneumonia is possible but considered less likely.   Original Report Authenticated By: Charline Bills, M.D.    Dg Lumbar Spine 2-3 Views  05/17/2012  *RADIOLOGY REPORT*  Clinical Data: Fall, low back pain  LUMBAR SPINE - 2-3 VIEW  Comparison: None.  Findings: Five lumbar-type vertebral bodies.  Normal lumbar lordosis.  No evidence of fracture or dislocation.  Vertebral body heights are maintained.  Mild to moderate degenerative changes.  Visualized bony pelvis is intact.  IMPRESSION: No fracture or dislocation is seen.  Mild to moderate degenerative changes.   Original Report Authenticated By: Charline Bills, M.D.     Review of Systems  Eyes: Negative.   Gastrointestinal: Positive for nausea.  Musculoskeletal: Positive for back pain.  Neurological: Negative.   Endo/Heme/Allergies:  Negative.    Blood pressure 88/59, pulse 71, temperature 97.4 F (36.3 C), temperature source Oral, resp. rate 20, height 5\' 11"  (1.803 m), weight 228 lb 1.6 oz (103.465 kg), SpO2 100.00%. Physical Exam WDWN in NAD HEENT:  EOMI, sclera anicteric; no palpable skull fracture or hematoma Neck:  No masses, no thyromegaly Lungs:  CTA bilaterally; normal respiratory effort CV:  Regular rate and rhythm; no murmurs Abd:  +bowel sounds, soft, non-tender, no masses Ext:  Well-perfused; no edema Skin:  Warm, dry; no sign of jaundice   Assessment/Plan: Recent history of fall, now with persistent nausea.   Anticoagulated for atrial fibrillation  Recs:  NPO until after CT scan CT scan of the abd/pelvis to rule out occult liver or splenic injury Repeat Hgb - not checked since hospital admission yesterday, and patient has been anticoagulated. Hold heparin gtt for now.   Marialena Wollen K. 05/18/2012, 7:23 PM

## 2012-05-18 NOTE — Progress Notes (Signed)
ANTICOAGULATION CONSULT NOTE - Follow Up Consult  Pharmacy Consult for Heparin Indication: Atrial Flutter  Heparin Dosing Weight: 97 kg  Labs:  Recent Labs  05/17/12 0910 05/17/12 0938 05/17/12 1616 05/17/12 2321 05/18/12 0536 05/18/12 1305  HGB 14.4 15.6  --   --  14.0  --   HCT 42.6 46.0  --   --  41.7  --   PLT 188  --   --   --  180  --   APTT  --   --  31  --   --   --   LABPROT  --   --  14.9  --   --   --   INR  --   --  1.19  --   --   --   HEPARINUNFRC  --   --   --  0.60 0.82* 0.73*  CREATININE  --  1.00  --   --  1.03  --     Medications:  Infusions:  . heparin 1,300 Units/hr (05/18/12 1610)   Assessment:  62 y/o male with newly diagnoses atrial flutter who has been placed on Heparin per protocol.  Heparin rate 1300 units/hr, Heparin level 0.73 No bleeding complications noted   Goal of Therapy:  Heparin Level 0.3 - 0.7    Plan:  Reduce Heparin to 1100 units/hr  Next Heparin level in 6 hours, then  Daily Heparin level and CBC while on Heparin.    [name] [name], Deetta Perla.D 05/18/2012, 2:54 PM

## 2012-05-18 NOTE — Consult Note (Signed)
[name] [name] 1951-01-28 MRN [id number]        History of Present Illness:  This is a 62 yo AA male with severe nausea but no vomiting for several days prior to admission,, he was admitted with increasing SOB, orthopnea , CHF and atrial flutter. He is scheduled for ablation tomorrow by Dr Graciela Husbands. We are seeing him because of concern for acute abdominal even related to recent fall at home ( while hunting) , injuring his back in last couple of weeks, now running systolic BP 88-90, in absence of any abdominal symptpms other than nausea.His nurse reports pt not eating his meals today, despite of Zofran. Heparin infusion was D/C'ed earlier today because of concern for possible abdominal event. He has been scheduled by trauma surgery for CT scan of the abd and pelvis to r/o hemoperitoneum. Hgb tonight 14.7, was 14.0 yesterday. Pt has no significant GI history   Past Medical History  Diagnosis Date  . DM type 2 (diabetes mellitus, type 2)   . Ischemic cardiomyopathy   . HTN (hypertension)   . Hypercholesteremia   . Ventricular tachycardia   . MI (myocardial infarction) 4098,1191 X 2     RCA-T, 70% PL (off CFX), 99% Prox LAD/90% Dist LAD, S/P TAXUS stent x 2  . Anginal pain   . ICD (implantable cardiac defibrillator) in place   . Shortness of breath   . Renal insufficiency   . Nephrolithiasis    Past Surgical History  Procedure Laterality Date  . Percutaneous coronary stent intervention (pci-s)  January 2002    PTCA/Stent Distal RCA  . Cardiac defibrillator placement  2007     Medtronic Maximo VR, serial number [id number]  . Percutaneous coronary stent intervention (pci-s)  June 2002    PTCA/Stent x 3 RCA, thrombolysis - failed  . Percutaneous coronary stent intervention (pci-s)  July 2006    TAXUS stents to prox and distal LAD    reports that he quit smoking about 12 years ago. He does not have any smokeless tobacco history on file. He reports that he does not drink alcohol or use  illicit drugs. family history is not on file. Allergies  Allergen Reactions  . Penicillins     history        Review of Systems:negative for heartburn, dysphagia, positive for SOB, orthopnea, had normal BM's  The remainder of the 10 point ROS is negative except as outlined in H&P   Physical Exam: General appearance  Well developed, in no distress, mild dyspnea, in semirecumbent position. Eyes- non icteric. HEENT nontraumatic, normocephalic. Mouth no lesions, tongue papillated, no cheilosis. Neck supple without adenopathy, thyroid not enlarged, no carotid bruits,  JVDdistended Lung sinspiratory rales bilat,  Cor normal S1, normal S2,ir regular rhythm,  Abdomen: soft, non distended, non tender, hyperactive bowl sounds, no bruit, liver edge at [facility] Rectal:yellow heme positive stool Extremities 2+ pedal edema. Skin no lesions. Neurological alert and oriented x 3. Psychological normal mood and affect.  Assessment and Plan:  Nausea without associated symptoms such as vomiting or abdominal pain. in a pt in CHF and a low flow state due to atrial flutter. His abdominal exam is normal, no sign of ileus or distention, Hgb is completely stable at 14.0 gms. He is heme positive which may have to be later investigated with colonoscopy if he reaches stable hemodynamic state. CT scan of the abdomen has been scheduled by another service to r/o hemoperitoneum. He is currently not a candidate for EGD, snice he  is scheduled for ablation for tomorrow.Nausea is multifactorial but mostly related to his cardiac decompensation Please cover with PPI,  Bowl rest Symptomatic  Rx with Phenergan May resume Heparin infusion if CT scan normal Will follow with you     05/18/2012 [name] [name]

## 2012-05-18 NOTE — Progress Notes (Signed)
   Subjective:  Denies CP or dyspnea   Objective:  Filed Vitals:   05/17/12 1633 05/17/12 2109 05/18/12 0213 05/18/12 0546  BP:  102/83 101/69 106/72  Pulse:  56 79 87  Temp:  97.5 F (36.4 C) 97.2 F (36.2 C) 97.3 F (36.3 C)  TempSrc:  Oral Oral Oral  Resp:  18 18 20   Height: 5\' 11"  (1.803 m)     Weight: 228 lb 9.6 oz (103.692 kg)   228 lb 1.6 oz (103.465 kg)  SpO2:  96% 98% 98%    Intake/Output from previous day:  Intake/Output Summary (Last 24 hours) at 05/18/12 0731 Last data filed at 05/18/12 0700  Gross per 24 hour  Intake 1026.43 ml  Output      0 ml  Net 1026.43 ml    Physical Exam: Physical exam: Well-developed well-nourished in no acute distress.  Skin is warm and dry.  HEENT is normal.  Neck is supple.  Chest is clear to auscultation with normal expansion.  Cardiovascular exam is irregular Abdominal exam nontender or distended. No masses palpated. Extremities show no edema. neuro grossly intact    Lab Results: Basic Metabolic Panel:  Recent Labs  08/23/74 0938 05/18/12 0536  NA 137 134*  K 4.5 4.3  CL 104 100  CO2  --  21  GLUCOSE 264* 172*  BUN 17 21  CREATININE 1.00 1.03  CALCIUM  --  9.4   CBC:  Recent Labs  05/17/12 0910 05/17/12 0938 05/18/12 0536  WBC 4.5  --  4.9  NEUTROABS 2.2  --   --   HGB 14.4 15.6 14.0  HCT 42.6 46.0 41.7  MCV 80.4  --  81.3  PLT 188  --  180   Cardiac Enzymes:  Recent Labs  05/17/12 1618 05/17/12 2321 05/18/12 0536  TROPONINI <0.30 <0.30 <0.30     Assessment/Plan:  1 atrial flutter-the patient remains in atrial flutter. His rate is controlled. Await echocardiogram. Continue heparin. I will ask EP to review for consideration of ablation. He would need a transesophageal echocardiogram prior to the procedure. I would like to avoid long-term anticoagulation as he has had a history of noncompliance. 2 ischemic cardiomyopathy-continue carvedilol. Add low-dose ARB. 3 coronary artery  disease-continue statin; DC asa given need for coumadin. 4 history of ventricular tachycardia-sotalol has been resumed. Repeat electrocardiogram tomorrow morning for QT. ICD in place. 5 hyperlipidemia-continue statin.  Olga Millers 05/18/2012, 7:31 AM

## 2012-05-18 NOTE — Progress Notes (Signed)
ANTICOAGULATION CONSULT NOTE - Follow Up Consult  Pharmacy Consult for Heparin Indication: new onset Aflutter with RVR  Allergies  Allergen Reactions  . Penicillins     history    Patient Measurements: Height: 5\' 11"  (180.3 cm) Weight: 228 lb 9.6 oz (103.692 kg) (scale c) IBW/kg (Calculated) : 75.3 Heparin Dosing Weight: 97 kg  Vital Signs: Temp: 97.5 F (36.4 C) (03/18 2109) Temp src: Oral (03/18 2109) BP: 102/83 mmHg (03/18 2109) Pulse Rate: 56 (03/18 2109)  Labs:  Recent Labs  05/17/12 0910 05/17/12 0938 05/17/12 1616 05/17/12 1618 05/17/12 2321  HGB 14.4 15.6  --   --   --   HCT 42.6 46.0  --   --   --   PLT 188  --   --   --   --   APTT  --   --  31  --   --   LABPROT  --   --  14.9  --   --   INR  --   --  1.19  --   --   HEPARINUNFRC  --   --   --   --  0.60  CREATININE  --  1.00  --   --   --   TROPONINI  --   --   --  <0.30  --     Estimated Creatinine Clearance: 93.9 ml/min (by C-G formula based on Cr of 1).   Medical History: Past Medical History  Diagnosis Date  . DM type 2 (diabetes mellitus, type 2)   . Ischemic cardiomyopathy   . History of MI (myocardial infarction)   . HTN (hypertension)   . Hypercholesteremia   . Coronary atherosclerosis of unspecified type of vessel, native or graft   . Ventricular tachycardia   . MI (myocardial infarction) 2002 X 2   . MI (myocardial infarction) 2006    RCA-T, 70% PL (off CFX), 99% Prox LAD/90% Dist LAD, S/P TAXUS stent x 2  . Anginal pain   . ICD (implantable cardiac defibrillator) in place   . Shortness of breath   . Renal insufficiency   . Nephrolithiasis     Medications:  Prescriptions prior to admission  Medication Sig Dispense Refill  . aspirin 325 MG tablet Take 325 mg by mouth daily.        . sotalol (BETAPACE) 120 MG tablet Take 1 tablet (120 mg total) by mouth 2 (two) times daily.  180 tablet  3  . insulin aspart (NOVOLOG) 100 UNIT/ML injection Inject 30 Units into the skin 2 (two)  times daily.        . insulin regular (HUMULIN R,NOVOLIN R) 100 UNIT/ML injection Inject 10 Units into the skin 2 (two) times daily before a meal.        . losartan (COZAAR) 50 MG tablet Take 1 tablet (50 mg total) by mouth daily.  90 tablet  4  . magnesium oxide (MAG-OX) 400 MG tablet Take 1 tablet (400 mg total) by mouth daily.  90 tablet  3  . metoprolol succinate (TOPROL-XL) 100 MG 24 hr tablet Take 1 tablet (100 mg total) by mouth daily.  90 tablet  3  . Multiple Vitamin (MULTIVITAMIN) tablet Take 1 tablet by mouth daily.        . rosuvastatin (CRESTOR) 40 MG tablet Take 1 tablet (40 mg total) by mouth daily.  90 tablet  4    Assessment: 62 yo male with new-onset atrial flutter on IV heparin. Heparin  level (0.6) is at-goal on 1450 units/hr.   Goal of Therapy:  Heparin level 0.3-0.7 units/ml Monitor platelets by anticoagulation protocol: Yes   Plan:  1. Continue IV heparin at 1450 units/hr. 2. Heparin level, CBC in AM   Lorre Munroe, PharmD  05/18/2012 12:08 AM

## 2012-05-18 NOTE — Progress Notes (Signed)
  Echocardiogram 2D Echocardiogram has been performed.  Ghazal Pevey 05/18/2012, 11:20 AM

## 2012-05-18 NOTE — Progress Notes (Signed)
Patient's BP has been soft through out the day. At this time BP is 88/59. Patient stated that he has had Vicodin twice on night shift and the first time he took it he started sweating. The second time he took Vicodin, he stated that he felt as though he was [place] and now he has a nasty taste in his mouth and feels nauseous. PA notified. Told to hold BP meds if SBP is less than 90. Will continue to monitor patient for further changes in condition.

## 2012-05-18 NOTE — Progress Notes (Addendum)
Patient ID: [name] [name], male   DOB: 1950-07-19, 62 y.o.   MRN: [id number]   Hemoglobin normal CT scan shows no sign of intra-abdominal pathology.  Possible chronic compression fx at L1.     Lab Results  Component Value Date   WBC 6.1 05/18/2012   HGB 14.2 05/18/2012   HCT 41.7 05/18/2012   MCV 81.1 05/18/2012   PLT 198 05/18/2012   Lab Results  Component Value Date   CREATININE 1.03 05/18/2012   BUN 21 05/18/2012   NA 134* 05/18/2012   K 4.3 05/18/2012   CL 100 05/18/2012   CO2 21 05/18/2012   Clinical Data: 62 year old male with abdominal, back and pelvic  pain following fall. The patient is anticoagulated.  CT ABDOMEN AND PELVIS WITH CONTRAST  Technique: Multidetector CT imaging of the abdomen and pelvis was  performed following the standard protocol during bolus  administration of intravenous contrast.  Contrast: 80mL OMNIPAQUE IOHEXOL 300 MG/ML SOLN  Comparison: None  Findings: Mild cardiomegaly and pacemaker lead identified.  Small bilateral pleural effusions and bibasilar atelectasis  identified.  The liver, spleen, pancreas, adrenal glands and gallbladder are  unremarkable.  Scattered areas of wedge shaped renal scarring are noted  bilaterally.  A wedge shaped area of decreased attenuation within the posterior  lateral mid - lower left kidney is identified but does not exhibit  parenchymal volume loss as the other areas of scarring. This may  represent an area of ischemia or acute - subacute infarct or less  likely pyelonephritis.  The bowel, appendix and bladder are unremarkable.  Prostate enlargement is present.  There is no evidence of enlarged lymph nodes, biliary dilatation or  abdominal aortic aneurysm.  There is no evidence of intra-abdominal or intrapelvic hematoma or  hemorrhage.  There is minimal wedging of the L1 vertebral body and does not have  an acute appearance.  Moderate degenerative changes at L4-L5 is present.  IMPRESSION:  No evidence of  intra-abdominal or intrapelvic hemorrhage/hematoma.  Small bilateral pleural effusions with mild bibasilar atelectasis.  Scattered areas of renal scarring bilaterally with one wedge-shaped  area in the mid - lower left kidney without significant parenchymal  loss - infarct or pyelonephritis is not excluded although this  finding is likely subacute to chronic.  Mild L1 vertebral body wedging - does not appear acute but  correlate with area of pain.  Original Report Authenticated By: Harmon Pier, M.D.  Would proceed with GI evaluation.  Possibly diabetic gastroparesis.  No acute surgical findings at this time. May restart heparin and diet per GI recomendations.  Wilmon Arms. Corliss Skains, MD, [facility]  05/18/2012 9:34 PM

## 2012-05-18 NOTE — ED Provider Notes (Signed)
I have personally seen and examined the patient.  I have discussed the plan of care with the resident.  I have reviewed the documentation on PMH/FH/Soc. History.  I have reviewed the documentation of the resident and agree.  I have reviewed and agree with the ECG interpretation(s) documented by the resident.  Pt well appearing, found to be in aflutter.  Home meds given with some drop in BP but pt remained stable, no significant bradycardia noted.  Admitted to cardiology service  Joya Gaskins, MD 05/18/12 [phone]

## 2012-05-18 NOTE — Progress Notes (Signed)
ANTICOAGULATION CONSULT NOTE - Follow Up Consult  Pharmacy Consult for heparin Indication: Aflutter  Labs:  Recent Labs  05/17/12 0910 05/17/12 0938 05/17/12 1616 05/17/12 1618 05/17/12 2321 05/18/12 0536  HGB 14.4 15.6  --   --   --  14.0  HCT 42.6 46.0  --   --   --  41.7  PLT 188  --   --   --   --  180  APTT  --   --  31  --   --   --   LABPROT  --   --  14.9  --   --   --   INR  --   --  1.19  --   --   --   HEPARINUNFRC  --   --   --   --  0.60 0.82*  CREATININE  --  1.00  --   --   --   --   TROPONINI  --   --   --  <0.30 <0.30  --     Assessment: 62yo male now supratherapeutic on heparin after one level at goal; lab drawn correctly.  Goal of Therapy:  Heparin level 0.3-0.7 units/ml   Plan:  Will decrease heparin gtt by 1-2 units/kg/hr to 1300 units/hr and check level in 6hr.  Vernard Gambles, PharmD, BCPS  05/18/2012,6:58 AM

## 2012-05-18 NOTE — Progress Notes (Signed)
Inpatient Diabetes Program Recommendations  AACE/ADA: New Consensus Statement on Inpatient Glycemic Control (2013)  Target Ranges:  Prepandial:   less than 140 mg/dL      Peak postprandial:   less than 180 mg/dL (1-2 hours)      Critically ill patients:  140 - 180 mg/dL   Reason for Visit: Referral received.  Spoke to patient at length regarding diabetes management.  He see's Dr. Clarene Duke in [place].  He was on insulin up until about 5 months ago.  According to patient he lost his insurance and stopped taking insulin.  He also has not been to see Dr. Clarene Duke.  His wife states "I tried to get him to keep taking his insulin".  Discussed affordable insulin options with patient. Consider starting 70/30 insulin 15 units bid with meals.  (will need Rx. For Novolin 70/30).  At [facility] this insulin is 24.88$ per vial.  Also gave patient and wife information regarding Reli-on glucose meter which is 9$.     Patient will require titration of insulin doses to obtain glycemic control.  Also discussed with patient and wife importance of checking CBG's at least 3-4 days per day and eating consistently at the same times daily.  They are unsure of if Dr. Clarene Duke will see them without insurance?  Consider "[facility]" referral with Dr. Laural Benes? Will order case management consult.

## 2012-05-19 ENCOUNTER — Encounter (HOSPITAL_COMMUNITY): Payer: Self-pay | Admitting: Certified Registered Nurse Anesthetist

## 2012-05-19 ENCOUNTER — Encounter (HOSPITAL_COMMUNITY)
Admission: EM | Disposition: A | Discharge status: Home / Self Care | Payer: Self-pay | Source: Home / Self Care | Attending: Cardiology

## 2012-05-19 ENCOUNTER — Inpatient Hospital Stay (HOSPITAL_COMMUNITY): Payer: MEDICAID | Admitting: Certified Registered Nurse Anesthetist

## 2012-05-19 DIAGNOSIS — I4892 Unspecified atrial flutter: Secondary | ICD-10-CM

## 2012-05-19 HISTORY — PX: ATRIAL FLUTTER ABLATION: SHX5733

## 2012-05-19 LAB — BASIC METABOLIC PANEL
Calcium: 9.3 mg/dL (ref 8.4–10.5)
Creatinine, Ser: 1.21 mg/dL (ref 0.50–1.35)
GFR calc non Af Amer: 62 mL/min — ABNORMAL LOW (ref >90)
Sodium: 137 mEq/L (ref 135–145)

## 2012-05-19 LAB — CBC
MCH: 26.8 pg (ref 26.0–34.0)
MCHC: 33.8 g/dL (ref 30.0–36.0)
MCV: 79.3 fL (ref 78.0–100.0)
Platelets: 171 10*3/uL (ref 150–400)

## 2012-05-19 LAB — POCT ACTIVATED CLOTTING TIME: Activated Clotting Time: 133 seconds

## 2012-05-19 LAB — GLUCOSE, CAPILLARY
Glucose-Capillary: 122 mg/dL — ABNORMAL HIGH (ref 70–99)
Glucose-Capillary: 113 mg/dL — ABNORMAL HIGH (ref 70–99)

## 2012-05-19 SURGERY — ATRIAL FLUTTER ABLATION
Anesthesia: Monitor Anesthesia Care

## 2012-05-19 SURGERY — ECHOCARDIOGRAM, TRANSESOPHAGEAL
Anesthesia: Moderate Sedation

## 2012-05-19 MED ORDER — WARFARIN SODIUM 7.5 MG PO TABS
7.5000 mg | ORAL_TABLET | Freq: Once | ORAL | Status: AC
  Administered 2012-05-19: 7.5 mg via ORAL
  Filled 2012-05-19: qty 1

## 2012-05-19 MED ORDER — WARFARIN VIDEO
Freq: Once | Status: AC
  Administered 2012-05-19: 1

## 2012-05-19 MED ORDER — LACTATED RINGERS IV SOLN
INTRAVENOUS | Status: DC | PRN
  Administered 2012-05-19: 13:00:00 via INTRAVENOUS

## 2012-05-19 MED ORDER — ROCURONIUM BROMIDE 100 MG/10ML IV SOLN
INTRAVENOUS | Status: DC | PRN
  Administered 2012-05-19: 30 mg via INTRAVENOUS
  Administered 2012-05-19: 20 mg via INTRAVENOUS

## 2012-05-19 MED ORDER — ACETAMINOPHEN 325 MG PO TABS: 650.0000 mg | ORAL_TABLET | ORAL | Status: DC | PRN

## 2012-05-19 MED ORDER — PHENYLEPHRINE HCL 10 MG/ML IJ SOLN
30.0000 ug/min | INTRAVENOUS | Status: DC
  Filled 2012-05-19: qty 1

## 2012-05-19 MED ORDER — ONDANSETRON HCL 4 MG/2ML IJ SOLN: 4.0000 mg | Freq: Four times a day (QID) | INTRAMUSCULAR | Status: DC | PRN

## 2012-05-19 MED ORDER — LIDOCAINE HCL (CARDIAC) 20 MG/ML IV SOLN
INTRAVENOUS | Status: DC | PRN
  Administered 2012-05-19: 10 mg via INTRAVENOUS

## 2012-05-19 MED ORDER — OXYCODONE HCL 5 MG/5ML PO SOLN: 5.0000 mg | Freq: Once | ORAL | Status: AC | PRN

## 2012-05-19 MED ORDER — FENTANYL CITRATE 0.05 MG/ML IJ SOLN
INTRAMUSCULAR | Status: DC | PRN
  Administered 2012-05-19: 50 ug via INTRAVENOUS

## 2012-05-19 MED ORDER — MEPERIDINE HCL 25 MG/ML IJ SOLN: 6.2500 mg | INTRAMUSCULAR | Status: DC | PRN

## 2012-05-19 MED ORDER — ALBUMIN HUMAN 5 % IV SOLN
INTRAVENOUS | Status: DC | PRN
  Administered 2012-05-19: 15:00:00 via INTRAVENOUS

## 2012-05-19 MED ORDER — ETOMIDATE 2 MG/ML IV SOLN
INTRAVENOUS | Status: DC | PRN
  Administered 2012-05-19: 18 mg via INTRAVENOUS

## 2012-05-19 MED ORDER — NEOSTIGMINE METHYLSULFATE 1 MG/ML IJ SOLN
INTRAMUSCULAR | Status: DC | PRN
  Administered 2012-05-19: 5 mg via INTRAVENOUS

## 2012-05-19 MED ORDER — ONDANSETRON HCL 4 MG/2ML IJ SOLN
INTRAMUSCULAR | Status: DC | PRN
  Administered 2012-05-19: 4 mg via INTRAVENOUS

## 2012-05-19 MED ORDER — SODIUM CHLORIDE 0.9 % IJ SOLN: 3.0000 mL | INTRAMUSCULAR | Status: DC | PRN

## 2012-05-19 MED ORDER — GLYCOPYRROLATE 0.2 MG/ML IJ SOLN
INTRAMUSCULAR | Status: DC | PRN
  Administered 2012-05-19: .8 mg via INTRAVENOUS

## 2012-05-19 MED ORDER — PHENYLEPHRINE HCL 10 MG/ML IJ SOLN
INTRAMUSCULAR | Status: DC | PRN
  Administered 2012-05-19: 40 ug via INTRAVENOUS

## 2012-05-19 MED ORDER — HEPARIN (PORCINE) IN NACL 100-0.45 UNIT/ML-% IJ SOLN: 1250.0000 [IU]/h | INTRAMUSCULAR | Status: DC

## 2012-05-19 MED ORDER — SODIUM CHLORIDE 0.9 % IV SOLN: 250.0000 mL | INTRAVENOUS | Status: DC | PRN

## 2012-05-19 MED ORDER — SODIUM CHLORIDE 0.9 % IV SOLN
Freq: Once | INTRAVENOUS | Status: AC
  Administered 2012-05-19: 500 mL via INTRAVENOUS

## 2012-05-19 MED ORDER — EPHEDRINE SULFATE 50 MG/ML IJ SOLN
INTRAMUSCULAR | Status: DC | PRN
  Administered 2012-05-19: 5 mg via INTRAVENOUS

## 2012-05-19 MED ORDER — BUPIVACAINE HCL (PF) 0.25 % IJ SOLN
INTRAMUSCULAR | Status: AC
  Filled 2012-05-19: qty 30

## 2012-05-19 MED ORDER — PROMETHAZINE HCL 25 MG/ML IJ SOLN
6.2500 mg | INTRAMUSCULAR | Status: DC | PRN
  Filled 2012-05-19: qty 1

## 2012-05-19 MED ORDER — MIDAZOLAM HCL 2 MG/2ML IJ SOLN: 0.5000 mg | Freq: Once | INTRAMUSCULAR | Status: AC | PRN

## 2012-05-19 MED ORDER — SODIUM CHLORIDE 0.9 % IJ SOLN
3.0000 mL | Freq: Two times a day (BID) | INTRAMUSCULAR | Status: DC
  Administered 2012-05-20: 3 mL via INTRAVENOUS

## 2012-05-19 MED ORDER — SODIUM CHLORIDE 0.9 % IV SOLN
INTRAVENOUS | Status: DC
  Administered 2012-05-19: 1000 mL via INTRAVENOUS

## 2012-05-19 MED ORDER — FENTANYL CITRATE 0.05 MG/ML IJ SOLN: 25.0000 ug | INTRAMUSCULAR | Status: DC | PRN

## 2012-05-19 MED ORDER — MIDAZOLAM HCL 5 MG/5ML IJ SOLN
INTRAMUSCULAR | Status: DC | PRN
  Administered 2012-05-19: 2 mg via INTRAVENOUS

## 2012-05-19 MED ORDER — SODIUM CHLORIDE 0.9 % IV SOLN: INTRAVENOUS | Status: DC

## 2012-05-19 MED ORDER — WARFARIN - PHARMACIST DOSING INPATIENT: Freq: Every day | Status: DC

## 2012-05-19 MED ORDER — COUMADIN BOOK
Freq: Once | Status: AC
  Administered 2012-05-19: 1
  Filled 2012-05-19: qty 1

## 2012-05-19 MED ORDER — OXYCODONE HCL 5 MG PO TABS: 5.0000 mg | ORAL_TABLET | Freq: Once | ORAL | Status: AC | PRN

## 2012-05-19 MED ORDER — HYDROCODONE-ACETAMINOPHEN 5-325 MG PO TABS: 1.0000 | ORAL_TABLET | ORAL | Status: DC | PRN

## 2012-05-19 MED ORDER — HEPARIN (PORCINE) IN NACL 100-0.45 UNIT/ML-% IJ SOLN
1250.0000 [IU]/h | INTRAMUSCULAR | Status: AC
  Administered 2012-05-19: 1250 [IU]/h via INTRAVENOUS
  Filled 2012-05-19 (×2): qty 250

## 2012-05-19 NOTE — Progress Notes (Signed)
Patient ID: [name] [name], male   DOB: [birth year], 62 y.o.   MRN: [id number] [name] [name] Progress Note  Subjective:  CT abd /pelvis negative. Py in good spirits. Says nausea somewhat better, just queasy and hungry!  he did take a bunch of Ibuprofen after he fell because of pain...waiting for TEE today  Objective:  Vital signs in last 24 hours: Temp:  [97.4 F (36.3 C)-98.2 F (36.8 C)] 98 F (36.7 C) (03/20 0436) Pulse Rate:  [71-108] 105 (03/20 0958) Resp:  [20] 20 (03/20 0436) BP: (88-110)/(57-77) 105/70 mmHg (03/20 0958) SpO2:  [98 %-100 %] 98 % (03/20 0436) Weight:  [229 lb 14.4 oz (104.282 kg)] 229 lb 14.4 oz (104.282 kg) (03/20 0436) Last BM Date: 05/18/12 General:   Alert,  Well-developed,  AA male  in NAD Heart:  Regular rate and rhythm; no murmurs Pulm;clear Abdomen:  Soft, nontender and nondistended. Normal bowel sounds, without guarding, and without rebound.   Extremities:  Without edema. Neurologic:  Alert and  oriented x4;  grossly normal neurologically. Psych:  Alert and cooperative. Normal mood and affect.  Intake/Output from previous day: 03/19 0701 - 03/20 0700 In: 1060.3 [P.O.:780; I.V.:280.3] Out: 975 [Urine:975] Intake/Output this shift: Total I/O In: 0  Out: 275 [Urine:275]  Lab Results:  Recent Labs  05/18/12 0536 05/18/12 1905 05/19/12 0600  WBC 4.9 6.1 5.7  HGB 14.0 14.2 13.5  HCT 41.7 41.7 39.9  PLT 180 198 171   BMET  Recent Labs  05/17/12 0938 05/18/12 0536 05/19/12 0600  NA 137 134* 137  K 4.5 4.3 4.8  CL 104 100 102  CO2  --  21 23  GLUCOSE 264* 172* 119*  BUN 17 21 22   CREATININE 1.00 1.03 1.21  CALCIUM  --  9.4 9.3   LFT  Recent Labs  05/18/12 0536  PROT 7.0  ALBUMIN 3.1*  AST 23  ALT 16  ALKPHOS 68  BILITOT 1.0   PT/INR  Recent Labs  05/17/12 1616  LABPROT 14.9  INR 1.19   Hepatitis Panel   Assessment / Plan: #1 62 yo male with ischemic cardiomyopathy/ ICD in atrial flutter with  nausea occuring a few days after a fall at home. Clinically he is improving, Ct negative. ? Nausea secondary to low flow state, pain and or NSaid induced gastropathy. No further  GI intervention planned- continue Protonix 40 mg daily for a few weeks Advance to solid diet post procedures today Principal Problem:   Atrial flutter with rapid ventricular response Active Problems:   DIABETES MELLITUS, TYPE II   HYPERCHOLESTEROLEMIA   HYPERTENSION, BENIGN SYSTEMIC   CARDIOMYOPATHY, ISCHEMIC   VENTRICULAR TACHYCARDIA   AUTOMATIC IMPLANTABLE CARDIAC DEFIBRILLATOR SITU   Orthopnea   Nausea with vomiting     LOS: 2 days   [name] [name]  05/19/2012, 11:27 AM

## 2012-05-19 NOTE — Progress Notes (Signed)
ANTICOAGULATION CONSULT NOTE - Follow Up Consult  Pharmacy Consult for Heparin + coumadin Indication: new onset Aflutter with RVR  Allergies  Allergen Reactions  . Penicillins     history    Patient Measurements: Height: 5\' 11"  (180.3 cm) Weight: 229 lb 14.4 oz (104.282 kg) IBW/kg (Calculated) : 75.3 Heparin Dosing Weight: 97 kg  Vital Signs: Temp: 98 F (36.7 C) (03/20 0436) Temp src: Oral (03/20 0436) BP: 110/71 mmHg (03/20 0436) Pulse Rate: 88 (03/20 0436)  Labs:  Recent Labs  05/17/12 0938 05/17/12 1616 05/17/12 1618  05/17/12 2321 05/18/12 0536 05/18/12 1305 05/18/12 1905 05/19/12 0600  HGB 15.6  --   --   --   --  14.0  --  14.2 13.5  HCT 46.0  --   --   --   --  41.7  --  41.7 39.9  PLT  --   --   --   --   --  180  --  198 171  APTT  --  31  --   --   --   --   --   --   --   LABPROT  --  14.9  --   --   --   --   --   --   --   INR  --  1.19  --   --   --   --   --   --   --   HEPARINUNFRC  --   --   --   < > 0.60 0.82* 0.73*  --  0.12*  CREATININE 1.00  --   --   --   --  1.03  --   --  1.21  TROPONINI  --   --  <0.30  --  <0.30 <0.30  --   --   --   < > = values in this interval not displayed.  Estimated Creatinine Clearance: 77.8 ml/min (by C-G formula based on Cr of 1.21).  Assessment: 62 yo male with new-onset atrial flutter continues on IV heparin. To also start on coumadin today. His baseline INR is 1.19. CBC is stable, no bleeding noted. Planning TEE for today and if negative will only need short-term anticoagulation.   Goal of Therapy:  Heparin level 0.3-0.7 units/ml Monitor platelets by anticoagulation protocol: Yes   Plan:  1. Continue heparin as ordered - f/u level this afternoon 2. Coumadin 7.5mg  PO x 1 tonight 3. Daily INR 4. F/u TEE results  Lysle Pearl, PharmD, BCPS Pager # [phone] 05/19/2012 8:29 AM

## 2012-05-19 NOTE — Progress Notes (Signed)
See complete TEE report in [employer]; Severe LV dysfunction (EF 25-30); moderate MR, moderate LAE; no LAA thrombus.

## 2012-05-19 NOTE — Interval H&P Note (Signed)
History and Physical Interval Note:  05/19/2012 1:08 PM  [name] [name]  has presented today for surgery, with the diagnosis of aflutter  The various methods of treatment have been discussed with the patient and family. After consideration of risks, benefits and other options for treatment, the patient has consented to  Procedure(s): ATRIAL FLUTTER ABLATION (N/A) as a surgical intervention .  The patient's history has been reviewed, patient examined, no change in status, stable for surgery.  I have reviewed the patient's chart and labs.  Questions were answered to the patient's satisfaction.    Therapeutic strategies for atrial flutter including medicine and ablation were discussed in detail with the patient today. Risk, benefits, and alternatives to EP study and radiofrequency ablation were also discussed in detail today. These risks include but are not limited to stroke, bleeding, vascular damage, tamponade, perforation, damage to the heart and other structures, AV block requiring pacemaker, ICD lead dislodgement, worsening renal function, and death. The patient understands these risk and wishes to proceed.  We will therefore proceed with TEE guided catheter ablation at today.     [name]

## 2012-05-19 NOTE — Progress Notes (Signed)
ANTICOAGULATION CONSULT NOTE - Follow Up Consult  Pharmacy Consult for Heparin Indication: new onset Aflutter with RVR  Allergies  Allergen Reactions  . Penicillins     history    Patient Measurements: Height: 5\' 11"  (180.3 cm) Weight: 229 lb 14.4 oz (104.282 kg) IBW/kg (Calculated) : 75.3 Heparin Dosing Weight: 97 kg  Vital Signs: Temp: 98 F (36.7 C) (03/20 0436) Temp src: Oral (03/20 0436) BP: 110/71 mmHg (03/20 0436) Pulse Rate: 88 (03/20 0436)  Labs:  Recent Labs  05/17/12 0938 05/17/12 1616 05/17/12 1618  05/17/12 2321 05/18/12 0536 05/18/12 1305 05/18/12 1905 05/19/12 0600  HGB 15.6  --   --   --   --  14.0  --  14.2 13.5  HCT 46.0  --   --   --   --  41.7  --  41.7 39.9  PLT  --   --   --   --   --  180  --  198 171  APTT  --  31  --   --   --   --   --   --   --   LABPROT  --  14.9  --   --   --   --   --   --   --   INR  --  1.19  --   --   --   --   --   --   --   HEPARINUNFRC  --   --   --   < > 0.60 0.82* 0.73*  --  0.12*  CREATININE 1.00  --   --   --   --  1.03  --   --   --   TROPONINI  --   --  <0.30  --  <0.30 <0.30  --   --   --   < > = values in this interval not displayed.  Estimated Creatinine Clearance: 91.4 ml/min (by C-G formula based on Cr of 1.03).   Medical History: Past Medical History  Diagnosis Date  . DM type 2 (diabetes mellitus, type 2)   . Ischemic cardiomyopathy   . HTN (hypertension)   . Hypercholesteremia   . Ventricular tachycardia   . MI (myocardial infarction) 9147,8295 X 2     RCA-T, 70% PL (off CFX), 99% Prox LAD/90% Dist LAD, S/P TAXUS stent x 2  . Anginal pain   . ICD (implantable cardiac defibrillator) in place   . Shortness of breath   . Renal insufficiency   . Nephrolithiasis     Medications:  Prescriptions prior to admission  Medication Sig Dispense Refill  . aspirin 325 MG tablet Take 325 mg by mouth daily.        . sotalol (BETAPACE) 120 MG tablet Take 1 tablet (120 mg total) by mouth 2 (two)  times daily.  180 tablet  3  . insulin aspart (NOVOLOG) 100 UNIT/ML injection Inject 30 Units into the skin 2 (two) times daily.        . insulin regular (HUMULIN R,NOVOLIN R) 100 UNIT/ML injection Inject 10 Units into the skin 2 (two) times daily before a meal.        . losartan (COZAAR) 50 MG tablet Take 1 tablet (50 mg total) by mouth daily.  90 tablet  4  . magnesium oxide (MAG-OX) 400 MG tablet Take 1 tablet (400 mg total) by mouth daily.  90 tablet  3  . metoprolol succinate (TOPROL-XL) 100 MG 24  hr tablet Take 1 tablet (100 mg total) by mouth daily.  90 tablet  3  . Multiple Vitamin (MULTIVITAMIN) tablet Take 1 tablet by mouth daily.        . rosuvastatin (CRESTOR) 40 MG tablet Take 1 tablet (40 mg total) by mouth daily.  90 tablet  4    Assessment: 62 yo male with new-onset atrial flutter on IV heparin. Heparin level (0.12) is below-goal on 1100 units/hr. No problem with line / infusion and no bleeding per RN.   Goal of Therapy:  Heparin level 0.3-0.7 units/ml Monitor platelets by anticoagulation protocol: Yes   Plan:  1. Increase IV heparin to 1250 units/hr.  2. Heparin level in 6 hours.   Lorre Munroe, PharmD  05/19/2012 7:02 AM

## 2012-05-19 NOTE — Preoperative (Signed)
Beta Blockers   Reason not to administer Beta Blockers:Not Applicable 

## 2012-05-19 NOTE — Progress Notes (Addendum)
   Subjective:  Denies CP or dyspnea; back sore; no abdominal pain   Objective:  Filed Vitals:   05/18/12 1300 05/18/12 1634 05/18/12 2058 05/19/12 0436  BP: 89/57 88/59 103/77 110/71  Pulse: 108 71 98 88  Temp: 97.4 F (36.3 C)  98.2 F (36.8 C) 98 F (36.7 C)  TempSrc: Oral  Oral Oral  Resp: 20  20 20   Height:      Weight:    229 lb 14.4 oz (104.282 kg)  SpO2: 100%  99% 98%    Intake/Output from previous day:  Intake/Output Summary (Last 24 hours) at 05/19/12 0735 Last data filed at 05/19/12 0645  Gross per 24 hour  Intake 1060.28 ml  Output    975 ml  Net  85.28 ml    Physical Exam: Physical exam: Well-developed well-nourished in no acute distress.  Skin is warm and dry.  HEENT is normal.  Neck is supple.  Chest is clear to auscultation with normal expansion.  Cardiovascular exam is irregular Abdominal exam nontender or distended. No masses palpated. Extremities show no edema. neuro grossly intact    Lab Results: Basic Metabolic Panel:  Recent Labs  16/10/96 0536 05/19/12 0600  NA 134* 137  K 4.3 4.8  CL 100 102  CO2 21 23  GLUCOSE 172* 119*  BUN 21 22  CREATININE 1.03 1.21  CALCIUM 9.4 9.3   CBC:  Recent Labs  05/17/12 0910  05/18/12 1905 05/19/12 0600  WBC 4.5  < > 6.1 5.7  NEUTROABS 2.2  --   --   --   HGB 14.4  < > 14.2 13.5  HCT 42.6  < > 41.7 39.9  MCV 80.4  < > 81.1 79.3  PLT 188  < > 198 171  < > = values in this interval not displayed. Cardiac Enzymes:  Recent Labs  05/17/12 1618 05/17/12 2321 05/18/12 0536  TROPONINI <0.30 <0.30 <0.30     Assessment/Plan:  1 atrial flutter-the patient remains in atrial flutter. His rate is controlled. Continue heparin and add coumadin. Plan TEE today and if no thrombus, a flutter ablation and short term anticoagulation. 2 ischemic cardiomyopathy-continue carvedilol and cozaar. BP soft; follow. 3 coronary artery disease-continue statin; No asa given need for coumadin. 4 history of  ventricular tachycardia-sotalol has been resumed. ICD in place. 5 hyperlipidemia-continue statin. 6 Nausea/back pain - HGB stable and CT neg  Olga Millers 05/19/2012, 7:35 AM

## 2012-05-19 NOTE — Anesthesia Procedure Notes (Signed)
Procedure Name: Intubation Date/Time: 05/19/2012 1:55 PM Performed by: Orvilla Fus A Pre-anesthesia Checklist: Patient identified, Timeout performed, Emergency Drugs available, Suction available and Patient being monitored Patient Re-evaluated:Patient Re-evaluated prior to inductionOxygen Delivery Method: Circle system utilized Preoxygenation: Pre-oxygenation with 100% oxygen Intubation Type: IV induction Ventilation: Mask ventilation without difficulty Laryngoscope Size: Mac and 3 Grade View: Grade I Tube type: Oral Number of attempts: 1 Airway Equipment and Method: Stylet Placement Confirmation: ETT inserted through vocal cords under direct vision,  breath sounds checked- equal and bilateral and positive ETCO2 Secured at: 24 cm Tube secured with: Tape

## 2012-05-19 NOTE — Op Note (Signed)
PREPROCEDURE DIAGNOSIS: Atrial flutter.   POSTPROCEDURE DIAGNOSIS: Isthmus-dependent clockwise right atrial flutter.   PROCEDURES:  1. Comprehensive EP study.  2. Coronary sinus pacing and recording.  3. Mapping of SVT.  4. Ablation of SVT.   INTRODUCTION:  [name] [name] is a 62 y.o. male with a history of newly diagnosed atrial flutter. He presents today for EP study and radiofrequency ablation.   DESCRIPTION OF THE PROCEDURE: Informed written consent was obtained, and the patient was brought to the electrophysiology lab in the fasting state. The patient was then adequately sedated with anesthesia as outlined in the anesthesia report. The patient's right groin was prepped and draped in the usual sterile fashion by the EP lab staff.  The patient underwent TEE by Dr [name] as reported separately which revealed no LAA thrombus.  The patients ICD was interrogated and therapies were programmed off. Using a percutaneous Seldinger technique a 7 and two 8-French hemostasis sheaths were placed into the right common femoral vein. A 7- [employer] decapolar coronary sinus catheter was introduced through the right common femoral vein and advanced into the coronary sinus for recording and pacing from this location. A 6-French quadripolar Josephson catheter was introduced through the right common femoral vein and advanced into the right ventricle for recording and pacing. This catheter was then pulled back to the His bundle location. The patient presented to the electrophysiology lab in atrial flutter. The surface electrocardiogram was consistent with atypical atrial flutter. The coronary sinus activation sequence was proximal to distal and suggestive of right atrial flutter.  The atrial flutter cycle length was 240 msec. Atrial entrainment mapping was then performed. When pacing from the left atrium, a long post pacing interval was observed. With entrainment mapping from the cavotricuspid isthmus,  the post pacing interval was slightly greatter than the tachycardia cycle length and therefore suggestive of isthmus-dependent right atrial flutter.  A halo catheter was advanced through the right common femoral vein and positioned around the tricuspid valve annulus.  This demonstrated clockwise rotation around the tricuspid valve annulus.  The patient's QRS duration measured 88 msec with an RR interval of 582 msec and an HV interval of 40 msec. I elected to perform cavotricuspid isthmus ablation.   A [employer] 7-French  10mm ablation catheter was introduced through the right common femoral vein and advanced into the right atrium. Mapping of the cavotricuspid isthmus was performed which revealed a standard isthmus. A series of two radiofrequency applications were delivered along the cavotricuspid isthmus with a target temperature of 60 degrees with power of 70 watts. During the 1st radiofrequency ablation lesion, the tachycardia slowed and then terminated. The patient remained in sinus rhythm thereafter. An additional radiofrequency application was then delivered as a bonus burn with similar parameters in order to obtain complete bidirectional cavotricuspid isthmus block.   Following ablation, differential atrial pacing was performed from the low lateral right atrium with a Duodecapolar halo catheter in place. This confirmed complete bidirectional cavotricuspid isthmus block with a stimulus to earliest atrial activation recorded bidirectional across the isthmus measuring 170 msec. The patient was observed for 20 minutes without return of conduction through the isthmus.  Following ablation, the AH interval measured 131 msec with an HV interval of 79 msec. Rapid atrial pacing was performed, which revealed an AV Wenckebach cycle length of 520 msec with no evidence of PR greater than RR and no tachycardias induced when pacing down to a cycle length of 250 msec. Ventricular pacing was then performed  which  revealed VA dissociation when pacing at a cycle length of 600 msec.  The procedure was therefore considered completed. All catheters were removed, and the sheaths were aspirated and flushed. The sheaths were removed and hemostasis was assured. The patients ICD was interrogated and tachytherapies were returned to their prior setting.  Ventricular lead pacing, sensing, and impedance measurements were stable when compared to prior to the ablation.  There were no early apparent complications.    CONCLUSIONS:  1. Isthmus-dependent right atrial flutter upon presentation, successfully ablated along the usual cavotricuspid isthmus.  2. Complete bidirectional cavotricuspid isthmus block achieved.  3. No inducible arrhythmias following ablation.  4. No early apparent complications.  Fayrene Fearing [name] Cotterman,MD 05/19/2012 2:53 PM

## 2012-05-19 NOTE — Progress Notes (Signed)
LOS: 2 days   Subjective: Pt feels good today, only complains of minor lower back pain and stiffness.  He tried some pain meds but it made him too groggy.  He would like to avoid using any medications or heat/ice until after his ablation.  Pt denies abdominal pain, nausea/vomiting, fever/chills.  Pt ambulating well, having good BM's and urinating well.  No concerns for [place].    Objective: Vital signs in last 24 hours: Temp:  [97.4 F (36.3 C)-98.2 F (36.8 C)] 98 F (36.7 C) (03/20 0436) Pulse Rate:  [71-108] 88 (03/20 0436) Resp:  [20] 20 (03/20 0436) BP: (88-110)/(57-77) 110/71 mmHg (03/20 0436) SpO2:  [98 %-100 %] 98 % (03/20 0436) Weight:  [229 lb 14.4 oz (104.282 kg)] 229 lb 14.4 oz (104.282 kg) (03/20 0436) Last BM Date: 05/18/12  Lab Results:  CBC  Recent Labs  05/18/12 1905 05/19/12 0600  WBC 6.1 5.7  HGB 14.2 13.5  HCT 41.7 39.9  PLT 198 171   BMET  Recent Labs  05/18/12 0536 05/19/12 0600  NA 134* 137  K 4.3 4.8  CL 100 102  CO2 21 23  GLUCOSE 172* 119*  BUN 21 22  CREATININE 1.03 1.21  CALCIUM 9.4 9.3    Imaging: Dg Chest 2 View  05/17/2012  *RADIOLOGY REPORT*  Clinical Data: Shortness of breath  CHEST - 2 VIEW  Comparison: 04/25/2010  Findings: Chronic interstitial markings.  Superimposed increased interstitial markings/bronchitic changes.  Vague right upper lobe opacity is possible but considered less likely.  Left basilar atelectasis.  No pleural effusion or pneumothorax.  Cardiomegaly.  Left subclavian ICD.  Degenerative changes of the visualized thoracolumbar spine.  IMPRESSION: Increased interstitial markings/bronchitic changes.  Vague right upper lobe opacity/pneumonia is possible but considered less likely.   Original Report Authenticated By: Charline Bills, M.D.    Dg Lumbar Spine 2-3 Views  05/17/2012  *RADIOLOGY REPORT*  Clinical Data: Fall, low back pain  LUMBAR SPINE - 2-3 VIEW  Comparison: None.  Findings: Five lumbar-type vertebral bodies.   Normal lumbar lordosis.  No evidence of fracture or dislocation.  Vertebral body heights are maintained.  Mild to moderate degenerative changes.  Visualized bony pelvis is intact.  IMPRESSION: No fracture or dislocation is seen.  Mild to moderate degenerative changes.   Original Report Authenticated By: Charline Bills, M.D.    Ct Abdomen Pelvis W Contrast  05/18/2012  *RADIOLOGY REPORT*  Clinical Data: 62 year old male with abdominal, back and pelvic pain following fall.  The patient is anticoagulated.  CT ABDOMEN AND PELVIS WITH CONTRAST  Technique:  Multidetector CT imaging of the abdomen and pelvis was performed following the standard protocol during bolus administration of intravenous contrast.  Contrast: 80mL OMNIPAQUE IOHEXOL 300 MG/ML  SOLN  Comparison: None  Findings: Mild cardiomegaly and pacemaker lead identified. Small bilateral pleural effusions and bibasilar atelectasis identified.  The liver, spleen, pancreas, adrenal glands and gallbladder are unremarkable.  Scattered areas of wedge shaped renal scarring are noted bilaterally. A wedge shaped area of decreased attenuation within the posterior lateral mid - lower left kidney is identified but does not exhibit parenchymal volume loss as the other areas of scarring.  This may represent an area of ischemia or acute - subacute infarct or less likely pyelonephritis.  The bowel, appendix and bladder are unremarkable. Prostate enlargement is present.  There is no evidence of enlarged lymph nodes, biliary dilatation or abdominal aortic aneurysm.  There is no evidence of intra-abdominal or intrapelvic hematoma or hemorrhage.  There is minimal wedging of the L1 vertebral body and does not have an acute appearance. Moderate degenerative changes at L4-L5 is present.  IMPRESSION: No evidence of intra-abdominal or intrapelvic hemorrhage/hematoma.  Small bilateral pleural effusions with mild bibasilar atelectasis.  Scattered areas of renal scarring bilaterally  with one wedge-shaped area in the mid - lower left kidney without significant parenchymal loss - infarct or pyelonephritis is not excluded although this finding is likely subacute to chronic.  Mild L1 vertebral body wedging - does not appear acute but correlate with area of pain.   Original Report Authenticated By: Harmon Pier, M.D.      PE: General appearance: alert, cooperative and no distress Resp: clear to auscultation bilaterally Cardio: irregularly irregular rhythm, on tele GI: soft, non-tender; bowel sounds normal; no masses,  no organomegaly   Assessment/Plan: Recent history of fall (3 weeks ago), now with persistent nausea.  Heme positive - GI following, will need to f/u with CSP Anticoagulated for atrial fibrillation - pending ablation  Plan: 1.  Can advance diet as tolerated since no concerning findings on CT (no occult liver/splenic injury) and Hgb are completely normal after last 3 values 2.  Can likely resume heparin for anticoagulation, will leave this up to cardiology, but no current contraindication 3.  Agree with GI's recommendations for conservative management 4.  Trauma surgery signing off today, call with questions/concerns    Aris Georgia, PA-C Pager: [phone] General Trauma PA Pager: [phone]   05/19/2012

## 2012-05-19 NOTE — Anesthesia Postprocedure Evaluation (Signed)
  Anesthesia Post-op Note  Patient: [name] [name]  Procedure(s) Performed: Procedure(s): ATRIAL FLUTTER ABLATION (N/A)  Patient Location: PACU  Anesthesia Type:General  Level of Consciousness: awake, alert , oriented and patient cooperative  Airway and Oxygen Therapy: Patient Spontanous Breathing and Patient connected to nasal cannula oxygen  Post-op Pain: none  Post-op Assessment: Post-op Vital signs reviewed, Patient's Cardiovascular Status Stable, Respiratory Function Stable, Patent Airway, No signs of Nausea or vomiting and Pain level controlled  Post-op Vital Signs: Reviewed and stable  Complications: No apparent anesthesia complications

## 2012-05-19 NOTE — Transfer of Care (Signed)
Immediate Anesthesia Transfer of Care Note  Patient: [name] [name]  Procedure(s) Performed: Procedure(s): ATRIAL FLUTTER ABLATION (N/A)  Patient Location: Cath Lab  Anesthesia Type:Sedation for TEE then conversion to general anes. for ablation  Level of Consciousness: awake, alert , oriented and patient cooperative  Airway & Oxygen Therapy: Patient Spontanous Breathing and Patient connected to nasal cannula oxygen  Post-op Assessment: Report given to PACU RN, Post -op Vital signs reviewed and stable and Patient moving all extremities  Post vital signs: Reviewed and stable  Complications: No apparent anesthesia complications

## 2012-05-19 NOTE — Progress Notes (Signed)
Patient seen, examined, and I agree with the above documentation, including the assessment and plan. Agree with PPI Pt ate full regular diet for dinner, well tolerated.  No nausea or vomiting No plans for endoscopic evaluation at this time. Colonoscopy for screening is recommended as an outpatient  Call with questions

## 2012-05-19 NOTE — Progress Notes (Signed)
  Echocardiogram Echocardiogram Transesophageal has been performed.  Georgian Co 05/19/2012, 1:54 PM

## 2012-05-19 NOTE — H&P (View-Only) (Signed)
 ELECTROPHYSIOLOGY CONSULT NOTE  Patient ID: [name] [name] MRN: [id number], DOB/AGE: 03/16/1950   Admit date: 05/17/2012 Date of Consult: 05/18/2012  Primary Physician: James Little, MD Primary Cardiologist: Brian Crenshaw, MD Reason for Consultation: Atrial flutter  History of Present Illness [name] [name] is a 62 year old man with an ischemic CM s/p ICD, paroxysmal VT on sotalol, chronic systolic HF, CAD, HTN, DM and CKD who presented to the ED c/o back pain after a fall while hunting; however, on presentation he also reported orthopnea and PND, found to have new onset atrial flutter. [name] [name] reports persistent 2-pillow orthopnea and PND x 1 week. He denies exertional dyspnea or chest pain. In face, he reports his activity/energy level has been "fine" like his usual self. He denies palpitations, dizziness or syncope. He denies LE or abdominal swelling. He denies ICD shocks. He reports noncompliance with his medications due to financial constraints and has been unable to afford them for the past 4 months. On admission his 12-lead ECG shows typical appearing atrial flutter at 112 bpm. Telemetry shows persistent atrial flutter that is overall rate controlled with occasional PVCs. His CEs are negative.  Past Medical History Past Medical History  Diagnosis Date  . DM type 2 (diabetes mellitus, type 2)   . Ischemic cardiomyopathy   . History of MI (myocardial infarction)   . HTN (hypertension)   . Hypercholesteremia   . Coronary atherosclerosis of unspecified type of vessel, native or graft   . Ventricular tachycardia   . MI (myocardial infarction) 2002 X 2   . MI (myocardial infarction) 2006    RCA-T, 70% PL (off CFX), 99% Prox LAD/90% Dist LAD, S/P TAXUS stent x 2  . Anginal pain   . ICD (implantable cardiac defibrillator) in place   . Shortness of breath   . Renal insufficiency   . Nephrolithiasis     Past Surgical History Past Surgical History  Procedure Laterality Date  .  Percutaneous coronary stent intervention (pci-s)  January 2002    PTCA/Stent Distal RCA  . Cardiac defibrillator placement  2007     Medtronic Maximo VR, serial number [id number]  . Percutaneous coronary stent intervention (pci-s)  June 2002    PTCA/Stent x 3 RCA, thrombolysis - failed  . Percutaneous coronary stent intervention (pci-s)  July 2006    TAXUS stents to prox and distal LAD     Allergies/Intolerances Allergies  Allergen Reactions  . Penicillins     history    Inpatient Medications . carvedilol  3.125 mg Oral BID WC  . insulin aspart  0-15 Units Subcutaneous TID WC  . insulin aspart  0-5 Units Subcutaneous QHS  . losartan  25 mg Oral Daily  . magnesium oxide  400 mg Oral Daily  . multivitamin  1 tablet Oral Daily  . simvastatin  40 mg Oral q1800  . sodium chloride  3 mL Intravenous Q12H  . sotalol  120 mg Oral BID   . heparin 1,300 Units/hr (05/18/12 0658)    Family History Positive for CAD in 4 brothers   Social History Social History  . 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 Smoking status: Former Smoker    Quit date: 03/02/2000  . Smokeless tobacco: No  . Alcohol Use: No  . Drug Use: No   Social History Narrative   Pt lives with wife. He hunts regularly and is active, walking >   1 mile at times without difficulty.    Review of Systems General: No chills, fever, night sweats or weight changes  Cardiovascular:  No chest pain, dyspnea on exertion, edema, palpitations Dermatological: No rash, lesions or masses Respiratory: No cough Urologic: No hematuria, dysuria Abdominal: No nausea, vomiting, diarrhea, bright red blood per rectum, melena, or hematemesis Neurologic: No visual changes, weakness, changes in mental status All other systems reviewed and are otherwise negative except as noted above.  Physical Exam Blood pressure 96/59, pulse 97, temperature 97.3 F (36.3  C), temperature source Oral, resp. rate 20, height 5' 11" (1.803 m), weight 228 lb 1.6 oz (103.465 kg), SpO2 98.00%.  General: Well developed, well appearing 62 year old male in no acute distress. HEENT: Normocephalic, atraumatic. EOMs intact. Sclera nonicteric. Oropharynx clear.  Neck: Supple without bruits. No JVD. Lungs: Respirations regular and unlabored, CTA bilaterally. No wheezes, rales or rhonchi. Heart: Regular S1, S2. No murmurs, rub, S3 or S4. Abdomen: Soft, non-tender, non-distended. BS present x 4 quadrants. No hepatosplenomegaly.  Extremities: No clubbing or cyanosis. Trace pedal edema bilaterally. DP/PT/radials 2+ and equal bilaterally. Psych: Normal affect. Neuro: Alert and oriented X 3. Moves all extremities spontaneously. Musculoskeletal: No kyphosis. Skin: Intact. Warm and dry. No rashes or petechiae in exposed areas.   Labs  Recent Labs  05/17/12 1618 05/17/12 2321 05/18/12 0536  TROPONINI <0.30 <0.30 <0.30   Lab Results  Component Value Date   WBC 4.9 05/18/2012   HGB 14.0 05/18/2012   HCT 41.7 05/18/2012   MCV 81.3 05/18/2012   PLT 180 05/18/2012     Recent Labs Lab 05/18/12 0536  NA 134*  K 4.3  CL 100  CO2 21  BUN 21  CREATININE 1.03  CALCIUM 9.4  PROT 7.0  BILITOT 1.0  ALKPHOS 68  ALT 16  AST 23  GLUCOSE 172*   Lab Results  Component Value Date   CHOL 159 05/18/2012   HDL 36* 05/18/2012   LDLCALC 112* 05/18/2012   TRIG 53 05/18/2012   Recent Labs  05/17/12 1616  TSH 3.534    Recent Labs  05/17/12 1616  INR 1.19    Radiology/Studies Dg Chest 2 View  05/17/2012  *RADIOLOGY REPORT*  Clinical Data: Shortness of breath  CHEST - 2 VIEW  Comparison: 04/25/2010  Findings: Chronic interstitial markings.  Superimposed increased interstitial markings/bronchitic changes.  Vague right upper lobe opacity is possible but considered less likely.  Left basilar atelectasis.  No pleural effusion or pneumothorax.  Cardiomegaly.  Left subclavian ICD.   Degenerative changes of the visualized thoracolumbar spine.  IMPRESSION: Increased interstitial markings/bronchitic changes.  Vague right upper lobe opacity/pneumonia is possible but considered less likely.   Original Report Authenticated By: Sriyesh Krishnan, M.D.     Echocardiogram  pending  12-lead ECG on admission shows typical appearing atrial flutter at 112 bpm; nonspecific IVCD - QRS 110 msec; 2 PVCs Telemetry shows persistent atrial flutter, rate controlled; occasional PVCs   Assessment and Plan 1. Atrial flutter, newly diagnosed 2. Acute on chronic systolic HF 3. Ischemic CM s/p ICD 4. Paroxysmal VT [name] [name] has persistent atrial flutter, newly diagnosed of unknown duration. Treatment options for atrial flutter were reviewed with him and his wife in detail, including medical therapy versus EPS +RF ablation. He is a candidate for EPS +RF ablation which is potentially a curative treatment option in 90-95% of cases of typical atrial flutter. This is probably the best option for [name] [name] since he would like to avoid   long term anticoagulation given his financial constraints and noncompliance. Risk, benefits, and alternatives to EP study and radiofrequency ablation were discussed in detail. These risks include but are not limited to stroke, bleeding, vascular damage, tamponade, perforation or damage to the heart and other structures and death. He will need a TEE prior to EPS +RF ablation to r/o LA/LAA thrombus. He will also require short term anticoagulation and we can probably provide samples for him. Dr. Klein to see.  Signed, EDMISTEN, BROOKE, PA-C 05/18/2012, 11:28 AM   The pt has asymptomatic aflutte with CHADSVASc >/=4  Presented to hospital w back pain and has had persistent nausea and BP soft (normal  140s now 95) NO abd pain but on heparin.    We discussed the role of RFCA for flutter as an alternative to cardioversion and medical therapy and the possibility of atrial  fibrillation requiring anticoagulation in the future even if RFCA successful  However, the nausea low bp and recent trauma invites the question of acute intrabdominal process and have spoken with Trauma surgery who suggested holding heparin for now, recheck CBC and CT for abd.  If the GI issue resolves, would resume heparin and pursue TEE DCCV in am  Reviewed extensively with family 

## 2012-05-19 NOTE — Progress Notes (Signed)
3/20  Pharmacy- Heparin 1815  62 yo male on Heparin for AFib, now s/p ablation and to resume Heparin.  The previous rate was 1250 units/hr, with the follow-up level cancelled due to ablation procedure.   1.  Resume Heparin 1250 units/hr 2.  Check heparin level 8 hours 3.  Daily heparin level, CBC  Marisue Humble, PharmD Clinical Pharmacist [facility]

## 2012-05-19 NOTE — Anesthesia Preprocedure Evaluation (Addendum)
Anesthesia Evaluation  Patient identified by MRN, date of birth, ID band Patient awake    Reviewed: Allergy & Precautions, H&P , NPO status , Patient's Chart, lab work & pertinent test results, reviewed documented beta blocker date and time   History of Anesthesia Complications Negative for: history of anesthetic complications  Airway Mallampati: II TM Distance: >3 FB Neck ROM: Full    Dental  (+) Edentulous Upper and Edentulous Lower   Pulmonary shortness of breath, former smoker,  breath sounds clear to auscultation  Pulmonary exam normal       Cardiovascular hypertension, Pt. on medications and Pt. on home beta blockers + angina + CAD, + Past MI and + Orthopnea + dysrhythmias (INR 1.19) Atrial Fibrillation and Ventricular Tachycardia + Cardiac Defibrillator (no shocks reported) Rhythm:Regular Rate:Normal  5/13 myoview: no sig ischemia, EF 25% with global hypokinesis  Aflutter presently   Neuro/Psych TIA   GI/Hepatic Neg liver ROS, GERD-  Medicated and Controlled,  Endo/Other  diabetes (glu 122), Well Controlled, Type 2, Insulin Dependent and Oral Hypoglycemic AgentsMorbid obesity  Renal/GU Renal InsufficiencyRenal disease (creat 1.21)     Musculoskeletal   Abdominal (+) + obese,   Peds  Hematology   Anesthesia Other Findings   Reproductive/Obstetrics                         Anesthesia Physical Anesthesia Plan  ASA: III  Anesthesia Plan: MAC and General   Post-op Pain Management:    Induction: Intravenous  Airway Management Planned: LMA  Additional Equipment:   Intra-op Plan:   Post-operative Plan:   Informed Consent: I have reviewed the patients History and Physical, chart, labs and discussed the procedure including the risks, benefits and alternatives for the proposed anesthesia with the patient or authorized representative who has indicated his/her understanding and acceptance.      Plan Discussed with: CRNA and Surgeon  Anesthesia Plan Comments: (Plan routine monitors, MAC for TEE, then GA-LMA OK for ablation)        Anesthesia Quick Evaluation

## 2012-05-19 NOTE — Progress Notes (Signed)
Nothing to offer from trauma service.

## 2012-05-20 DIAGNOSIS — I252 Old myocardial infarction: Secondary | ICD-10-CM

## 2012-05-20 DIAGNOSIS — Z95810 Presence of automatic (implantable) cardiac defibrillator: Secondary | ICD-10-CM

## 2012-05-20 LAB — CBC
Platelets: 165 10*3/uL (ref 150–400)
RBC: 4.73 MIL/uL (ref 4.22–5.81)
RDW: 14.1 % (ref 11.5–15.5)
WBC: 6.8 10*3/uL (ref 4.0–10.5)

## 2012-05-20 LAB — BASIC METABOLIC PANEL
CO2: 23 mEq/L (ref 19–32)
Calcium: 9.1 mg/dL (ref 8.4–10.5)
Chloride: 100 mEq/L (ref 96–112)
Creatinine, Ser: 1.45 mg/dL — ABNORMAL HIGH (ref 0.50–1.35)
GFR calc Af Amer: 58 mL/min — ABNORMAL LOW (ref >90)
Sodium: 135 mEq/L (ref 135–145)

## 2012-05-20 LAB — HEPARIN LEVEL (UNFRACTIONATED): Heparin Unfractionated: 0.36 IU/mL (ref 0.30–0.70)

## 2012-05-20 LAB — PROTIME-INR: INR: 1.42 (ref 0.00–1.49)

## 2012-05-20 LAB — GLUCOSE, CAPILLARY: Glucose-Capillary: 176 mg/dL — ABNORMAL HIGH (ref 70–99)

## 2012-05-20 MED ORDER — SOTALOL HCL 120 MG PO TABS: 120.0000 mg | ORAL_TABLET | Freq: Two times a day (BID) | ORAL | Status: DC

## 2012-05-20 MED ORDER — INSULIN ASPART 100 UNIT/ML ~~LOC~~ SOLN: 30.0000 [IU] | Freq: Two times a day (BID) | SUBCUTANEOUS | Status: DC

## 2012-05-20 MED ORDER — INSULIN REGULAR HUMAN 100 UNIT/ML IJ SOLN: 10.0000 [IU] | Freq: Two times a day (BID) | INTRAMUSCULAR | Status: DC

## 2012-05-20 MED ORDER — LOSARTAN POTASSIUM 25 MG PO TABS: 12.5000 mg | ORAL_TABLET | Freq: Every day | ORAL | Status: DC

## 2012-05-20 MED ORDER — WARFARIN SODIUM 7.5 MG PO TABS
7.5000 mg | ORAL_TABLET | Freq: Once | ORAL | Status: DC
  Filled 2012-05-20: qty 1

## 2012-05-20 MED ORDER — PANTOPRAZOLE SODIUM 40 MG PO TBEC: 40.0000 mg | DELAYED_RELEASE_TABLET | Freq: Every day | ORAL | Status: DC

## 2012-05-20 MED ORDER — SIMVASTATIN 40 MG PO TABS: 40.0000 mg | ORAL_TABLET | Freq: Every day | ORAL | Status: DC

## 2012-05-20 MED ORDER — ENOXAPARIN SODIUM 100 MG/ML ~~LOC~~ SOLN: 100.0000 mg | Freq: Two times a day (BID) | SUBCUTANEOUS | Status: DC

## 2012-05-20 MED ORDER — ENOXAPARIN SODIUM 120 MG/0.8ML ~~LOC~~ SOLN
105.0000 mg | Freq: Two times a day (BID) | SUBCUTANEOUS | Status: DC
  Administered 2012-05-20: 105 mg via SUBCUTANEOUS
  Filled 2012-05-20 (×2): qty 0.8

## 2012-05-20 MED ORDER — CARVEDILOL 3.125 MG PO TABS: 3.1250 mg | ORAL_TABLET | Freq: Two times a day (BID) | ORAL | Status: DC

## 2012-05-20 MED ORDER — WARFARIN SODIUM 5 MG PO TABS: 7.5000 mg | ORAL_TABLET | Freq: Every day | ORAL | Status: DC

## 2012-05-20 NOTE — Discharge Summary (Signed)
CARDIOLOGY DISCHARGE SUMMARY    Patient ID: [name] [name],  MRN: [id number], DOB/AGE: January 15, 1951 62 y.o.  Admit date: 05/17/2012 Discharge date: 05/20/2012  Primary Care Physician: None currently; previously Aida Puffer, MD (before loss of insurance)  Primary Cardiologist: Olga Millers, MD Primary EP: Ladona Ridgel, MD  Primary Discharge Diagnosis:  1. Atrial flutter s/p EPS +RF ablation yesterday - remains in SR  2. Medical noncompliance due to loss of health insurance coverage 3. Hypotension  Secondary Discharge Diagnoses:  1. Ischemic CM s/p ICD implant previously 2. Chronic systolic HF 3. CAD - stable without anginal symptoms; continue medical therapy 4. Paroxysmal VT, on sotalol 5. DM 6. Dyslipidemia 7. Chronic renal insufficiency   Procedures This Admission:  1. TEE 05/19/2012 (see full report in [employer]) CONCLUSIONS: Severe LV dysfunction (EF 25-30) Moderate MR Moderate LAE No LAA thrombus  2. EPS +RF ablation of atrial flutter  CONCLUSIONS: 1. Isthmus-dependent right atrial flutter upon presentation, successfully ablated along the usual cavotricuspid isthmus.  2. Complete bidirectional cavotricuspid isthmus block achieved.  3. No inducible arrhythmias following ablation.  4. No early apparent complications.  Consultations:  1. Diabetes coordinator 2. Trauma surgery 3. Gastroenterology 4. Case management  History and Hospital Course:  [name] [name] is a 62 year old man with an ischemic CM s/p ICD, paroxysmal VT on sotalol, chronic systolic HF, CAD, HTN, DM and CKD who presented to the ED c/o back pain after a fall while hunting; however, on presentation he also reported orthopnea and PND, found to have new onset atrial flutter. [name] [name] reports persistent 2-pillow orthopnea and PND x 1 week. He denies exertional dyspnea or chest pain. In fact, he reports his activity/energy level has been "fine" like his usual self. He denies palpitations, dizziness or syncope.  He denies LE or abdominal swelling. He denies ICD shocks. He reports noncompliance with his medications due to financial constraints and has been unable to afford them for the past 4 months. On admission his 12-lead ECG revealed atrial flutter at 112 bpm. Telemetry revealed persistent atrial flutter with occasional PVCs. He was started on IV heparin and ultimately warfarin for embolic prophylaxis. His medication regimen was adjusted for hypotension (now on low dose carvedilol and ARB, sotalol was continued). Treatment options for atrial flutter were reviewed with [name] [name] and his wife in detail. He elected to proceed with TEE-guided EPS +RF ablation (please see details as outlined above) on 05/19/2012. [name] [name] tolerated this procedure well without any immediate complication. He remains hemodynamically stable and afebrile. His groin site is intact without significant bleeding or hematoma. He has been given discharge instructions including wound care and activity restrictions. He will follow-up on Monday, 05/23/2012 at our office for Coumadin management and BP check. He will also follow-up with Dr. Johney Frame in clinic in 4 weeks. He was instructed to call [facility]) to make an appointment within one week to establish care with PCP. Of note, during his hospitalization, trauma surgery was consulted for recommendations given his recent fall. CT of the abdomen and pelvis showed no acute intra-abdominal process/abnormality. Trauma had no further recommendations. Gastroenterology was also consulted for nausea and recommendations were made for PPI therapy and outpatient follow-up as needed. He has been seen, examined and deemed stable for discharge today by Dr. [name].  Discharge Vitals: Blood pressure 92/60, pulse 78, temperature 97.6 F (36.4 C), temperature source Oral, resp. rate 18, height 5\' 11"  (1.803 m), weight 233 lb 0.4 oz (  105.7 kg), SpO2 98.00%.   Labs: Lab Results    Component Value Date   WBC 6.8 05/20/2012   HGB 12.8* 05/20/2012   HCT 38.5* 05/20/2012   MCV 81.4 05/20/2012   PLT 165 05/20/2012    Recent Labs Lab 05/18/12 0536  05/20/12 0610  NA 134*  < > 135  K 4.3  < > 4.4  CL 100  < > 100  CO2 21  < > 23  BUN 21  < > 27*  CREATININE 1.03  < > 1.45*  CALCIUM 9.4  < > 9.1  PROT 7.0  --   --   BILITOT 1.0  --   --   ALKPHOS 68  --   --   ALT 16  --   --   AST 23  --   --   GLUCOSE 172*  < > 211*  < > = values in this interval not displayed. Lab Results  Component Value Date   CKTOTAL 146 04/26/2010   CKMB 2.0 04/26/2010   TROPONINI <0.30 05/18/2012    Lab Results  Component Value Date   CHOL 159 05/18/2012   CHOL 243* 07/10/2011   CHOL 132 06/30/2010   Lab Results  Component Value Date   HDL 36* 05/18/2012   HDL 44.80 07/10/2011   HDL 21.30* 06/30/2010   Lab Results  Component Value Date   LDLCALC 112* 05/18/2012   LDLCALC 84 06/30/2010   LDLCALC 97 04/04/2010   Lab Results  Component Value Date   TRIG 53 05/18/2012   TRIG 80.0 07/10/2011   TRIG 57.0 06/30/2010   Lab Results  Component Value Date   CHOLHDL 4.4 05/18/2012   CHOLHDL 5 07/10/2011   CHOLHDL 4 06/30/2010    Recent Labs  05/20/12 0610  INR 1.42    Disposition:  The patient is being discharged in stable condition.  Follow-up:     Follow-up Information   Follow up with [facility] On 05/23/2012. (At 10:45 AM to establish care for Coumadin follow-up (INR check))    Contact information:   [address] [place] [address] [phone]      Follow up with [facility] Nurse On 05/23/2012. (At 11:00 AM for BP check)    Contact information:   [address] [place] [address] [phone]      Follow up with [name], MD On 06/29/2012. (At 2:15 PM)    Contact information:   [address] [place] [address] [phone]      [facility] with [facility]. Schedule an appointment as soon as possible for a visit in 1 week. (To establish care as PCP)    Contact information:   [phone]     Discharge Medications:    Medication List    STOP taking these medications       aspirin 325 MG tablet     metoprolol succinate 100 MG 24 hr tablet  Commonly known as:  TOPROL-XL     rosuvastatin 40 MG tablet  Commonly known as:  CRESTOR      TAKE these medications       carvedilol 3.125 MG tablet  Commonly known as:  COREG  Take 1 tablet (3.125 mg total) by mouth 2 (two) times daily with a meal.     enoxaparin 100 MG/ML injection  Commonly known as:  LOVENOX  Inject 1 mL (  100 mg total) into the skin every 12 (twelve) hours.     insulin aspart 100 UNIT/ML injection  Commonly known as:  novoLOG  Inject 30 Units into the skin 2 (two) times daily.     insulin regular 100 units/mL injection  Commonly known as:  NOVOLIN R,HUMULIN R  Inject 0.1 mLs (10 Units total) into the skin 2 (two) times daily before a meal.     losartan 25 MG tablet  Commonly known as:  COZAAR  Take 0.5 tablets (12.5 mg total) by mouth daily.     magnesium oxide 400 MG tablet  Commonly known as:  MAG-OX  Take 1 tablet (400 mg total) by mouth daily.     multivitamin tablet  Take 1 tablet by mouth daily.     pantoprazole 40 MG tablet  Commonly known as:  PROTONIX  Take 1 tablet (40 mg total) by mouth daily at 6 (six) AM.     simvastatin 40 MG tablet  Commonly known as:  ZOCOR  Take 1 tablet (40 mg total) by mouth daily at 6 PM.     sotalol 120 MG tablet  Commonly known as:  BETAPACE  Take 1 tablet (120 mg total) by mouth 2 (two) times daily.     warfarin 5 MG tablet  Commonly known as:  COUMADIN  Take 1.5 tablets (7.5 mg total) by mouth daily with supper.       Duration of Discharge Encounter: Greater than 30 minutes including physician time.  Limmie Patricia, PA-C 05/20/2012, 1:07 PM   [name] MD

## 2012-05-20 NOTE — Progress Notes (Signed)
Patient: [name] [name] Date of Encounter: 05/20/2012, 7:42 AM Admit date: 05/17/2012     Subjective  [name] [name] reports his throat feels "scratchy" but is otherwise without complaints. He denies CP or SOB. He is ambulating without difficulty.   Objective  Physical Exam: Vitals: BP 96/50  Pulse 73  Temp(Src) 97.6 F (36.4 C) (Oral)  Resp 18  Ht 5\' 11"  (1.803 m)  Wt 233 lb 0.4 oz (105.7 kg)  BMI 32.51 kg/m2  SpO2 98% General: Well developed, well appearing 62 year old male in no acute distress. Neck: Supple. JVD not elevated. Lungs: Clear bilaterally to auscultation without wheezes, rales, or rhonchi. Breathing is unlabored. Heart: RRR S1 S2 without murmurs, rubs, or gallops.  Abdomen: Soft, non-distended. Extremities: No clubbing or cyanosis. No edema.  Distal pedal pulses are 2+ and equal bilaterally. Neuro: Alert and oriented X 3. Moves all extremities spontaneously. No focal deficits.  Intake/Output:  Intake/Output Summary (Last 24 hours) at 05/20/12 0742 Last data filed at 05/20/12 0600  Gross per 24 hour  Intake 1739.07 ml  Output    275 ml  Net 1464.07 ml    Inpatient Medications:  . carvedilol  3.125 mg Oral BID WC  . insulin aspart  0-15 Units Subcutaneous TID WC  . insulin aspart  0-5 Units Subcutaneous QHS  . losartan  25 mg Oral Daily  . magnesium oxide  400 mg Oral Daily  . multivitamin  1 tablet Oral Daily  . pantoprazole  40 mg Oral Q0600  . simvastatin  40 mg Oral q1800  . sodium chloride  3 mL Intravenous Q12H  . sodium chloride  3 mL Intravenous Q12H  . sotalol  120 mg Oral BID  . warfarin  7.5 mg Oral ONCE-1800  . Warfarin - Pharmacist Dosing Inpatient   Does not apply q1800   . heparin 1,250 Units/hr (05/19/12 1843)  . phenylephrine (NEO-SYNEPHRINE) Adult infusion Stopped (05/19/12 1848)    Labs:  Recent Labs  05/19/12 0600 05/20/12 0610  NA 137 135  K 4.8 4.4  CL 102 100  CO2 23 23  GLUCOSE 119* 211*  BUN 22 27*  CREATININE  1.21 1.45*  CALCIUM 9.3 9.1    Recent Labs  05/18/12 0536  AST 23  ALT 16  ALKPHOS 68  BILITOT 1.0  PROT 7.0  ALBUMIN 3.1*    Recent Labs  05/17/12 0910  05/19/12 0600 05/20/12 0610  WBC 4.5  < > 5.7 6.8  NEUTROABS 2.2  --   --   --   HGB 14.4  < > 13.5 12.8*  HCT 42.6  < > 39.9 38.5*  MCV 80.4  < > 79.3 81.4  PLT 188  < > 171 165  < > = values in this interval not displayed.  Recent Labs  05/17/12 1618 05/17/12 2321 05/18/12 0536  TROPONINI <0.30 <0.30 <0.30    Recent Labs  05/17/12 0910  HGBA1C 11.5*    Recent Labs  05/18/12 0536  CHOL 159  HDL 36*  LDLCALC 112*  TRIG 53  CHOLHDL 4.4    Recent Labs  05/17/12 1616  TSH 3.534    Recent Labs  05/20/12 0610  INR 1.42    Radiology/Studies: Dg Chest 2 View  05/17/2012  *RADIOLOGY REPORT*  Clinical Data: Shortness of breath  CHEST - 2 VIEW  Comparison: 04/25/2010  Findings: Chronic interstitial markings.  Superimposed increased interstitial markings/bronchitic changes.  Vague right upper lobe opacity is possible but considered  less likely.  Left basilar atelectasis.  No pleural effusion or pneumothorax.  Cardiomegaly.  Left subclavian ICD.  Degenerative changes of the visualized thoracolumbar spine.  IMPRESSION: Increased interstitial markings/bronchitic changes.  Vague right upper lobe opacity/pneumonia is possible but considered less likely.   Original Report Authenticated By: Charline Bills, M.D.     Telemetry: sinus rhythm; occasional PVCs, brief ventricular bigeminy    Assessment and Plan  1. Atrial flutter s/p EPS +RF ablation yesterday - remains in SR 2. Ischemic CM, ICD in place 3. Paroxysmal VT, on sotalol 4. CAD - stable without anginal symptoms; continue medical therapy 5. Subtherapeutic INR - will ask case management to see for assistance with Lovenox for bridging until INR therapeutic 6. Medical noncompliance  Dr. Johney Frame to see Signed, EDMISTEN, BROOKE PA-C  I have seen,  examined the patient, and reviewed the above assessment and plan.  Changes to above are made where necessary.   Doing well s/p atrial flutter ablation. Will convert heparin drip to lovenox.  Will need bridge until INR >2.   If case management can assist with home lovenox, then I would anticipate discharge later today.  Otherwise he will be here until INR >2.  He needs close outpatient follow-up of INRs (Tuesday if he goes home today)  Co Sign: [name], MD 05/20/2012 8:07 AM

## 2012-05-20 NOTE — Progress Notes (Signed)
ANTICOAGULATION CONSULT NOTE - Follow Up Consult  Pharmacy Consult for Heparin + coumadin Indication: new onset Aflutter with RVR  Allergies  Allergen Reactions  . Penicillins     history    Patient Measurements: Height: 5\' 11"  (180.3 cm) Weight: 233 lb 0.4 oz (105.7 kg) IBW/kg (Calculated) : 75.3 Heparin Dosing Weight: 97 kg  Vital Signs: Temp: 97.6 F (36.4 C) (03/21 0459) Temp src: Oral (03/21 0459) BP: 96/50 mmHg (03/21 0459) Pulse Rate: 73 (03/21 0459)  Labs:  Recent Labs  05/17/12 1616 05/17/12 1618  05/17/12 2321 05/18/12 0536 05/18/12 1305 05/18/12 1905 05/19/12 0600 05/20/12 0610  HGB  --   --   --   --  14.0  --  14.2 13.5 12.8*  HCT  --   --   --   --  41.7  --  41.7 39.9 38.5*  PLT  --   --   --   --  180  --  198 171 165  APTT 31  --   --   --   --   --   --   --   --   LABPROT 14.9  --   --   --   --   --   --   --  17.0*  INR 1.19  --   --   --   --   --   --   --  1.42  HEPARINUNFRC  --   --   < > 0.60 0.82* 0.73*  --  0.12* 0.36  CREATININE  --   --   --   --  1.03  --   --  1.21 1.45*  TROPONINI  --  <0.30  --  <0.30 <0.30  --   --   --   --   < > = values in this interval not displayed.  Estimated Creatinine Clearance: 65.4 ml/min (by C-G formula based on Cr of 1.45).  Assessment: 62 yo male with new-onset atrial flutter s/p ablation continues on IV heparin / Coumadin.  TEE showed no LAA thrombus.   INR 1.42 after Coumadin 7.5mg  yesterday. Heparin level (0.36) is at-goal on 1250 units/hr.   Goal of Therapy:  Heparin level 0.3-0.7 units/ml Monitor platelets by anticoagulation protocol: Yes   Plan:  1. Continue IV heparin at 1250 units/hr.  2. Heparin level in 6 hours to confirm dosing.  3. Coumadin 7.5mg  po today.   Lorre Munroe, PharmD  05/20/2012 7:34 AM

## 2012-05-20 NOTE — Progress Notes (Addendum)
Noted new orders for home lovenox.  Physician, please write RX for lovenox and pt. Will be able to obtain medications from the Lovenox fund from main pharmacy.  Also, pt. Is eligible for [facility] program for 34 days medications for $3 co-pay each.  Pt. Will need Home Health RN for INR draw on Tuesday.   Gave pt. information of following up at [facility] for PCP until pt. can obtain insurance.  Instructed pt. to sign up for Medicaid thru Department of Social Service.  Tera Mater, RN, BSN NCM [phone]

## 2012-05-20 NOTE — Progress Notes (Signed)
05/20/12 1150 This NCM spoke with Nehemiah Settle, [place] and noted that pt. will follow up with Dr. Jenel Lucks office for PT/INR check up.  Pt. will not need Home Health services at this time. Tera Mater, RN, BSN NCM [phone]

## 2012-05-20 NOTE — Progress Notes (Signed)
ANTICOAGULATION CONSULT NOTE - Follow Up Consult  Pharmacy Consult for Heparin + coumadin >> Lovenox Indication: new onset Aflutter with RVR  Allergies  Allergen Reactions  . Penicillins     history    Patient Measurements: Height: 5\' 11"  (180.3 cm) Weight: 233 lb 0.4 oz (105.7 kg) IBW/kg (Calculated) : 75.3 Heparin Dosing Weight: 97 kg  Vital Signs: Temp: 97.6 F (36.4 C) (03/21 0459) Temp src: Oral (03/21 0459) BP: 96/50 mmHg (03/21 0459) Pulse Rate: 73 (03/21 0459)  Labs:  Recent Labs  05/17/12 1616 05/17/12 1618  05/17/12 2321 05/18/12 0536 05/18/12 1305 05/18/12 1905 05/19/12 0600 05/20/12 0610  HGB  --   --   --   --  14.0  --  14.2 13.5 12.8*  HCT  --   --   --   --  41.7  --  41.7 39.9 38.5*  PLT  --   --   --   --  180  --  198 171 165  APTT 31  --   --   --   --   --   --   --   --   LABPROT 14.9  --   --   --   --   --   --   --  17.0*  INR 1.19  --   --   --   --   --   --   --  1.42  HEPARINUNFRC  --   --   < > 0.60 0.82* 0.73*  --  0.12* 0.36  CREATININE  --   --   --   --  1.03  --   --  1.21 1.45*  TROPONINI  --  <0.30  --  <0.30 <0.30  --   --   --   --   < > = values in this interval not displayed.  Estimated Creatinine Clearance: 65.4 ml/min (by C-G formula based on Cr of 1.45).  Assessment: 62 yo male admitted 05/17/2012  with new-onset atrial flutter s/p ablation continues on IV heparin / Coumadin.  TEE showed no LAA thrombus.   INR 1.42 after Coumadin 7.5mg  yesterday. Heparin level (0.36) is at-goal on 1250 units/hr. Plan to transition to lovenox and attempt to DC home today.    Goal of Therapy:  4h Heparin level 0.6-1.2 Monitor platelets by anticoagulation protocol: Yes   Plan: - DC IV heparin at 9 am - Lovenox 105 mg sq q12h starting at 10 am - DC heparin levels and change CBC to q 72h -  Coumadin 7.5mg  po today.   Thank you for allowing pharmacy to be a part of this patients care team.  Lovenia Kim Pharm.D., BCPS Clinical  Pharmacist 05/20/2012 8:18 AM Pager: [phone] Phone: [phone]

## 2012-05-20 NOTE — Progress Notes (Signed)
All d/c instructions explained and given to pt.  Verbalized understanding.  Pt d/c off floor to home transport by Arna Medici NT via w/c .  Amanda Pea, Charity fundraiser.

## 2012-05-21 ENCOUNTER — Inpatient Hospital Stay (HOSPITAL_COMMUNITY)
Admission: EM | Admit: 2012-05-21 | Discharge: 2012-05-31 | DRG: 682 | Disposition: A | Discharge status: Home / Self Care | Payer: Medicaid Other | Attending: Cardiology | Admitting: Cardiology

## 2012-05-21 ENCOUNTER — Encounter (HOSPITAL_COMMUNITY): Payer: Self-pay | Admitting: Family Medicine

## 2012-05-21 ENCOUNTER — Emergency Department (HOSPITAL_COMMUNITY): Payer: Medicaid Other

## 2012-05-21 DIAGNOSIS — E1121 Type 2 diabetes mellitus with diabetic nephropathy: Secondary | ICD-10-CM | POA: Diagnosis present

## 2012-05-21 DIAGNOSIS — I5023 Acute on chronic systolic (congestive) heart failure: Secondary | ICD-10-CM | POA: Diagnosis present

## 2012-05-21 DIAGNOSIS — I517 Cardiomegaly: Secondary | ICD-10-CM | POA: Diagnosis present

## 2012-05-21 DIAGNOSIS — E7800 Pure hypercholesterolemia, unspecified: Secondary | ICD-10-CM | POA: Diagnosis present

## 2012-05-21 DIAGNOSIS — I472 Ventricular tachycardia, unspecified: Secondary | ICD-10-CM | POA: Diagnosis present

## 2012-05-21 DIAGNOSIS — R112 Nausea with vomiting, unspecified: Secondary | ICD-10-CM | POA: Diagnosis present

## 2012-05-21 DIAGNOSIS — I509 Heart failure, unspecified: Secondary | ICD-10-CM | POA: Diagnosis present

## 2012-05-21 DIAGNOSIS — N179 Acute kidney failure, unspecified: Secondary | ICD-10-CM | POA: Clinically undetermined

## 2012-05-21 DIAGNOSIS — I2589 Other forms of chronic ischemic heart disease: Secondary | ICD-10-CM | POA: Diagnosis present

## 2012-05-21 DIAGNOSIS — Z7901 Long term (current) use of anticoagulants: Secondary | ICD-10-CM

## 2012-05-21 DIAGNOSIS — I480 Paroxysmal atrial fibrillation: Secondary | ICD-10-CM | POA: Diagnosis present

## 2012-05-21 DIAGNOSIS — Z87442 Personal history of urinary calculi: Secondary | ICD-10-CM

## 2012-05-21 DIAGNOSIS — I4729 Other ventricular tachycardia: Secondary | ICD-10-CM | POA: Diagnosis present

## 2012-05-21 DIAGNOSIS — E119 Type 2 diabetes mellitus without complications: Secondary | ICD-10-CM

## 2012-05-21 DIAGNOSIS — Z95810 Presence of automatic (implantable) cardiac defibrillator: Secondary | ICD-10-CM

## 2012-05-21 DIAGNOSIS — N183 Chronic kidney disease, stage 3 unspecified: Secondary | ICD-10-CM | POA: Diagnosis present

## 2012-05-21 DIAGNOSIS — Z9861 Coronary angioplasty status: Secondary | ICD-10-CM

## 2012-05-21 DIAGNOSIS — Z794 Long term (current) use of insulin: Secondary | ICD-10-CM

## 2012-05-21 DIAGNOSIS — N259 Disorder resulting from impaired renal tubular function, unspecified: Secondary | ICD-10-CM | POA: Diagnosis present

## 2012-05-21 DIAGNOSIS — I129 Hypertensive chronic kidney disease with stage 1 through stage 4 chronic kidney disease, or unspecified chronic kidney disease: Secondary | ICD-10-CM | POA: Diagnosis present

## 2012-05-21 DIAGNOSIS — I959 Hypotension, unspecified: Secondary | ICD-10-CM | POA: Diagnosis present

## 2012-05-21 DIAGNOSIS — Z87891 Personal history of nicotine dependence: Secondary | ICD-10-CM

## 2012-05-21 DIAGNOSIS — I059 Rheumatic mitral valve disease, unspecified: Secondary | ICD-10-CM | POA: Diagnosis present

## 2012-05-21 DIAGNOSIS — Z880 Allergy status to penicillin: Secondary | ICD-10-CM

## 2012-05-21 DIAGNOSIS — I4892 Unspecified atrial flutter: Secondary | ICD-10-CM

## 2012-05-21 DIAGNOSIS — I252 Old myocardial infarction: Secondary | ICD-10-CM

## 2012-05-21 DIAGNOSIS — I10 Essential (primary) hypertension: Secondary | ICD-10-CM | POA: Diagnosis present

## 2012-05-21 DIAGNOSIS — I2510 Atherosclerotic heart disease of native coronary artery without angina pectoris: Secondary | ICD-10-CM | POA: Diagnosis present

## 2012-05-21 HISTORY — DX: Long term (current) use of anticoagulants: Z79.01

## 2012-05-21 LAB — TROPONIN I
Troponin I: 0.33 ng/mL (ref <0.30)
Troponin I: <0.3 ng/mL (ref <0.30)
Troponin I: 0.33 ng/mL (ref <0.30)

## 2012-05-21 LAB — COMPREHENSIVE METABOLIC PANEL
Albumin: 3.2 g/dL — ABNORMAL LOW (ref 3.5–5.2)
Alkaline Phosphatase: 102 U/L (ref 39–117)
BUN: 34 mg/dL — ABNORMAL HIGH (ref 6–23)
Chloride: 98 mEq/L (ref 96–112)
Creatinine, Ser: 2.02 mg/dL — ABNORMAL HIGH (ref 0.50–1.35)
GFR calc Af Amer: 39 mL/min — ABNORMAL LOW (ref >90)
Glucose, Bld: 142 mg/dL — ABNORMAL HIGH (ref 70–99)
Potassium: 5.1 mEq/L (ref 3.5–5.1)
Total Bilirubin: 1.3 mg/dL — ABNORMAL HIGH (ref 0.3–1.2)
Total Protein: 7.3 g/dL (ref 6.0–8.3)

## 2012-05-21 LAB — CBC WITH DIFFERENTIAL/PLATELET
Basophils Absolute: 0 10*3/uL (ref 0.0–0.1)
Lymphocytes Relative: 19 % (ref 12–46)
Lymphs Abs: 2.1 10*3/uL (ref 0.7–4.0)
Neutro Abs: 7.1 10*3/uL (ref 1.7–7.7)
Platelets: 174 10*3/uL (ref 150–400)
RBC: 5.09 MIL/uL (ref 4.22–5.81)
RDW: 13.9 % (ref 11.5–15.5)
WBC: 11 10*3/uL — ABNORMAL HIGH (ref 4.0–10.5)

## 2012-05-21 LAB — PROTIME-INR
INR: 1.93 — ABNORMAL HIGH (ref 0.00–1.49)
Prothrombin Time: 21.3 seconds — ABNORMAL HIGH (ref 11.6–15.2)

## 2012-05-21 LAB — PRO B NATRIURETIC PEPTIDE: Pro B Natriuretic peptide (BNP): 2093 pg/mL — ABNORMAL HIGH (ref 0–125)

## 2012-05-21 LAB — APTT: aPTT: 48 seconds — ABNORMAL HIGH (ref 24–37)

## 2012-05-21 LAB — GLUCOSE, CAPILLARY
Glucose-Capillary: 142 mg/dL — ABNORMAL HIGH (ref 70–99)
Glucose-Capillary: 156 mg/dL — ABNORMAL HIGH (ref 70–99)

## 2012-05-21 MED ORDER — SODIUM CHLORIDE 0.9 % IV SOLN
250.0000 mL | INTRAVENOUS | Status: DC | PRN
  Administered 2012-05-23 – 2012-05-25 (×4): 250 mL via INTRAVENOUS

## 2012-05-21 MED ORDER — MAGNESIUM OXIDE 400 (241.3 MG) MG PO TABS
400.0000 mg | ORAL_TABLET | Freq: Every day | ORAL | Status: DC
  Administered 2012-05-21 – 2012-05-22 (×2): 400 mg via ORAL
  Filled 2012-05-21 (×2): qty 1

## 2012-05-21 MED ORDER — DOPAMINE-DEXTROSE 3.2-5 MG/ML-% IV SOLN
2.0000 ug/kg/min | INTRAVENOUS | Status: DC
  Administered 2012-05-21 – 2012-05-26 (×5): 2.993 ug/kg/min via INTRAVENOUS
  Filled 2012-05-21 (×4): qty 250

## 2012-05-21 MED ORDER — SOTALOL HCL 120 MG PO TABS
120.0000 mg | ORAL_TABLET | Freq: Two times a day (BID) | ORAL | Status: DC
  Administered 2012-05-21 – 2012-05-22 (×2): 120 mg via ORAL
  Filled 2012-05-21 (×4): qty 1

## 2012-05-21 MED ORDER — ONDANSETRON HCL 4 MG/2ML IJ SOLN
4.0000 mg | Freq: Four times a day (QID) | INTRAMUSCULAR | Status: DC | PRN
  Administered 2012-05-21: 4 mg via INTRAVENOUS
  Filled 2012-05-21: qty 2

## 2012-05-21 MED ORDER — WARFARIN - PHARMACIST DOSING INPATIENT
Freq: Every day | Status: DC
  Administered 2012-05-22 – 2012-05-30 (×5)

## 2012-05-21 MED ORDER — SODIUM CHLORIDE 0.9 % IJ SOLN
3.0000 mL | Freq: Two times a day (BID) | INTRAMUSCULAR | Status: DC
  Administered 2012-05-21 – 2012-05-31 (×16): 3 mL via INTRAVENOUS

## 2012-05-21 MED ORDER — WARFARIN SODIUM 7.5 MG PO TABS: 7.5000 mg | ORAL_TABLET | Freq: Every day | ORAL | Status: DC

## 2012-05-21 MED ORDER — MAGNESIUM OXIDE 400 MG PO TABS: 400.0000 mg | ORAL_TABLET | Freq: Every day | ORAL | Status: DC

## 2012-05-21 MED ORDER — INSULIN ASPART 100 UNIT/ML ~~LOC~~ SOLN
30.0000 [IU] | Freq: Two times a day (BID) | SUBCUTANEOUS | Status: DC
  Administered 2012-05-21: 30 [IU] via SUBCUTANEOUS

## 2012-05-21 MED ORDER — FUROSEMIDE 10 MG/ML IJ SOLN
40.0000 mg | Freq: Every day | INTRAMUSCULAR | Status: DC
  Administered 2012-05-21 – 2012-05-22 (×2): 40 mg via INTRAVENOUS
  Filled 2012-05-21 (×2): qty 4

## 2012-05-21 MED ORDER — HEPARIN (PORCINE) IN NACL 100-0.45 UNIT/ML-% IJ SOLN: 1000.0000 [IU]/h | INTRAMUSCULAR | Status: DC

## 2012-05-21 MED ORDER — WARFARIN SODIUM 5 MG PO TABS
5.0000 mg | ORAL_TABLET | Freq: Once | ORAL | Status: AC
  Administered 2012-05-21: 5 mg via ORAL
  Filled 2012-05-21 (×3): qty 1

## 2012-05-21 MED ORDER — ADULT MULTIVITAMIN W/MINERALS CH
1.0000 | ORAL_TABLET | Freq: Every day | ORAL | Status: DC
  Administered 2012-05-21 – 2012-05-31 (×11): 1 via ORAL
  Filled 2012-05-21 (×11): qty 1

## 2012-05-21 MED ORDER — ACETAMINOPHEN 325 MG PO TABS: 650.0000 mg | ORAL_TABLET | ORAL | Status: DC | PRN

## 2012-05-21 MED ORDER — HEPARIN BOLUS VIA INFUSION: 4000.0000 [IU] | Freq: Once | INTRAVENOUS | Status: DC

## 2012-05-21 MED ORDER — PANTOPRAZOLE SODIUM 40 MG PO TBEC
40.0000 mg | DELAYED_RELEASE_TABLET | Freq: Every day | ORAL | Status: DC
  Administered 2012-05-21 – 2012-05-31 (×11): 40 mg via ORAL
  Filled 2012-05-21 (×11): qty 1

## 2012-05-21 MED ORDER — ONDANSETRON HCL 4 MG/2ML IJ SOLN
4.0000 mg | Freq: Once | INTRAMUSCULAR | Status: AC
  Administered 2012-05-21: 4 mg via INTRAVENOUS
  Filled 2012-05-21: qty 2

## 2012-05-21 MED ORDER — SIMVASTATIN 40 MG PO TABS
40.0000 mg | ORAL_TABLET | Freq: Every day | ORAL | Status: DC
  Administered 2012-05-21 – 2012-05-30 (×10): 40 mg via ORAL
  Filled 2012-05-21 (×11): qty 1

## 2012-05-21 MED ORDER — ONE-DAILY MULTI VITAMINS PO TABS: 1.0000 | ORAL_TABLET | Freq: Every day | ORAL | Status: DC

## 2012-05-21 MED ORDER — SODIUM CHLORIDE 0.9 % IJ SOLN: 3.0000 mL | INTRAMUSCULAR | Status: DC | PRN

## 2012-05-21 MED ORDER — INSULIN REGULAR HUMAN 100 UNIT/ML IJ SOLN
10.0000 [IU] | Freq: Two times a day (BID) | INTRAMUSCULAR | Status: DC
  Filled 2012-05-21 (×17): qty 0.1

## 2012-05-21 NOTE — ED Notes (Addendum)
C/o sob, h/o afib with RVR, pt tachypneic, speaking in short phrases. LS CTA. Straight back to room, orders placed by protocol. H/o elevated BNP, cardiomegaly, PNA & AICD.

## 2012-05-21 NOTE — ED Notes (Signed)
Dr.Opitz shown critical results from [facility]. ED-Lab

## 2012-05-21 NOTE — H&P (Addendum)
HPI: 62 year old male with past medical history of ischemic cardiomyopathy, prior ICD, ventricular tachycardia, renal insufficiency, diabetes mellitus, hypertension, hyperlipidemia, status post recent atrial flutter ablation admitted with acute on chronic systolic congestive heart failure and hypotension. Note last Myoview in October of 2010 showed an ejection fraction of 27%. There was a prior apical and inferior infarct but no ischemia. Patient recently admitted with increasing CHF symptoms. He was found to be in atrial flutter. Transesophageal echocardiogram was performed on 05/19/2012 and revealed an ejection fraction of 25-30%. There was moderate left atrial enlargement, mild right atrial enlargement and moderate mitral regurgitation. There was no left atrial appendage thrombus. The patient underwent successful atrial flutter ablation and was discharged yesterday. He presents today with complaints of worsening dyspnea. There is orthopnea and minimal pedal edema. He denies chest pain, fevers or chills or productive cough. He has mild residual "soreness" which is improving. Note at the time of his recent admission he had discontinued all of his previous medications. These were resumed and there was borderline blood pressure. Also note he was discharged on Lovenox bridge until INR 2 or greater.    (Not in a hospital admission)  Allergies  Allergen Reactions  . Penicillins Other (See Comments)    Unknown reaction    Past Medical History  Diagnosis Date  . DM type 2 (diabetes mellitus, type 2)   . Ischemic cardiomyopathy   . HTN (hypertension)   . Hypercholesteremia   . Ventricular tachycardia   . MI (myocardial infarction) 1610,9604 X 2     RCA-T, 70% PL (off CFX), 99% Prox LAD/90% Dist LAD, S/P TAXUS stent x 2  . Anginal pain   . ICD (implantable cardiac defibrillator) in place   . Shortness of breath   . Renal insufficiency   . Nephrolithiasis     Past Surgical History  Procedure  Laterality Date  . Percutaneous coronary stent intervention (pci-s)  January 2002    PTCA/Stent Distal RCA  . Cardiac defibrillator placement  2007     Medtronic Maximo VR, serial number [id number]  . Percutaneous coronary stent intervention (pci-s)  June 2002    PTCA/Stent x 3 RCA, thrombolysis - failed  . Percutaneous coronary stent intervention (pci-s)  July 2006    TAXUS stents to prox and distal LAD    History   Social History  . Marital Status: Married    Spouse Name: N/A    Number of Children: 3  . 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 Smoking status: Former Smoker    Quit date: 03/02/2000  . Smokeless tobacco: Not on file  . Alcohol Use: No  . Drug Use: No  . Sexually Active: Not on file   Other Topics Concern  . Not on file   Social History Narrative   Pt lives with wife. He hunts regularly and is active, walking > 1 mile at times without difficulty.    No family history on file.  ROS:  Some throat soreness and mild back pain but no fevers or chills, productive cough, hemoptysis, dysphasia, odynophagia, melena, hematochezia, dysuria, hematuria, rash, seizure activity, claudication. Remaining systems are negative.  Physical Exam:   Blood pressure 101/83, pulse 69, temperature 0 F (-17.8 C), resp. rate 23, SpO2 97.00%.  General:  Well developed/well nourished in NAD Skin warm/dry Patient not depressed No peripheral clubbing Back-normal HEENT-normal/normal eyelids Neck supple/normal carotid upstroke bilaterally; no  bruits; no JVD; no thyromegaly chest - mildly diminished breath sounds at bases. CV - RRR/normal S1 and S2; no rubs or gallops;  PMI nondisplaced, 2/6 systolic murmur apex. Abdomen -NT/ND, no HSM, no mass, + bowel sounds, no bruit 2+ femoral pulses, no bruits Ext-no edema, chords, 2+ DP Neuro-grossly nonfocal  ECG sinus rhythm with first degree AV block,  anterior and inferior infarct.  Results for orders placed during the hospital encounter of 05/21/12 (from the past 48 hour(s))  COMPREHENSIVE METABOLIC PANEL     Status: Abnormal   Collection Time    05/21/12  7:00 AM      Result Value Range   Sodium 132 (*) 135 - 145 mEq/L   Potassium 5.1  3.5 - 5.1 mEq/L   Chloride 98  96 - 112 mEq/L   CO2 20  19 - 32 mEq/L   Glucose, Bld 142 (*) 70 - 99 mg/dL   BUN 34 (*) 6 - 23 mg/dL   Creatinine, Ser 4.69 (*) 0.50 - 1.35 mg/dL   Calcium 9.7  8.4 - 62.9 mg/dL   Total Protein 7.3  6.0 - 8.3 g/dL   Albumin 3.2 (*) 3.5 - 5.2 g/dL   AST 39 (*) 0 - 37 U/L   ALT 23  0 - 53 U/L   Alkaline Phosphatase 102  39 - 117 U/L   Total Bilirubin 1.3 (*) 0.3 - 1.2 mg/dL   GFR calc non Af Amer 34 (*) >90 mL/min   GFR calc Af Amer 39 (*) >90 mL/min   Comment:            The eGFR has been calculated     using the CKD EPI equation.     This calculation has not been     validated in all clinical     situations.     eGFR's persistently     <90 mL/min signify     possible Chronic Kidney Disease.  CBC WITH DIFFERENTIAL     Status: Abnormal   Collection Time    05/21/12  7:00 AM      Result Value Range   WBC 11.0 (*) 4.0 - 10.5 K/uL   RBC 5.09  4.22 - 5.81 MIL/uL   Hemoglobin 14.3  13.0 - 17.0 g/dL   HCT 52.8  41.3 - 24.4 %   MCV 80.7  78.0 - 100.0 fL   MCH 28.1  26.0 - 34.0 pg   MCHC 34.8  30.0 - 36.0 g/dL   RDW 01.0  27.2 - 53.6 %   Platelets 174  150 - 400 K/uL   Neutrophils Relative 64  43 - 77 %   Neutro Abs 7.1  1.7 - 7.7 K/uL   Lymphocytes Relative 19  12 - 46 %   Lymphs Abs 2.1  0.7 - 4.0 K/uL   Monocytes Relative 16 (*) 3 - 12 %   Monocytes Absolute 1.8 (*) 0.1 - 1.0 K/uL   Eosinophils Relative 0  0 - 5 %   Eosinophils Absolute 0.0  0.0 - 0.7 K/uL   Basophils Relative 0  0 - 1 %   Basophils Absolute 0.0  0.0 - 0.1 K/uL  PRO B NATRIURETIC PEPTIDE     Status: Abnormal   Collection Time    05/21/12  7:00 AM      Result Value Range   Pro B  Natriuretic peptide (BNP) 2093.0 (*) 0 - 125 pg/mL  PROTIME-INR     Status: Abnormal   Collection  Time    05/21/12  7:00 AM      Result Value Range   Prothrombin Time 21.3 (*) 11.6 - 15.2 seconds   INR 1.93 (*) 0.00 - 1.49  APTT     Status: Abnormal   Collection Time    05/21/12  7:00 AM      Result Value Range   aPTT 48 (*) 24 - 37 seconds   Comment:            IF BASELINE aPTT IS ELEVATED,     SUGGEST PATIENT RISK ASSESSMENT     BE USED TO DETERMINE APPROPRIATE     ANTICOAGULANT THERAPY.  POCT I-STAT TROPONIN I     Status: Abnormal   Collection Time    05/21/12  7:07 AM      Result Value Range   Troponin i, poc 0.23 (*) 0.00 - 0.08 ng/mL   Comment NOTIFIED PHYSICIAN     Comment 3            Comment: Due to the release kinetics of cTnI,     a negative result within the first hours     of the onset of symptoms does not rule out     myocardial infarction with certainty.     If myocardial infarction is still suspected,     repeat the test at appropriate intervals.  TROPONIN I     Status: Abnormal   Collection Time    05/21/12  7:16 AM      Result Value Range   Troponin I 0.33 (*) <0.30 ng/mL   Comment:            Due to the release kinetics of cTnI,     a negative result within the first hours     of the onset of symptoms does not rule out     myocardial infarction with certainty.     If myocardial infarction is still suspected,     repeat the test at appropriate intervals.     CRITICAL RESULT CALLED TO, READ BACK BY AND VERIFIED WITH:     Trinda Pascal [id number] MCCAULEG  PROTIME-INR     Status: Abnormal   Collection Time    05/21/12  8:07 AM      Result Value Range   Prothrombin Time 21.9 (*) 11.6 - 15.2 seconds   INR 2.00 (*) 0.00 - 1.49    Dg Chest Portable 1 View  05/21/2012  *RADIOLOGY REPORT*  Clinical Data: Shortness of breath  PORTABLE CHEST - 1 VIEW  Comparison: 05/17/2012  Findings: Poor inspiratory effect.  Mild cardiac enlargement stable.  Single lead  pacer with a pulse generator over left thorax, stable.  Mild vascular congestion.  IMPRESSION: Mild vascular congestion.  No evidence of focal consolidation or pulmonary edema.   Original Report Authenticated By: Esperanza Heir, M.D.     Assessment/Plan 1 acute on chronic systolic congestive heart failure-the patient complains of dyspnea most likely from congestive heart failure. His blood pressure is decreased. Hold carvedilol and ARB. Plan gentle diuresis with Lasix 40 mg daily. Follow renal function. 2 elevated troponin I-most likely from recent ablation. Will continue to cycle enzymes. If no further trend up will plan outpatient functional study. 3 history of ventricular tachycardia-continue sotalol. ICD in place. 4 status post atrial flutter ablation-INR is therapeutic. Discontinue Lovenox. 5 acute on chronic stage III renal failure-most likely from hypotension. Follow renal function. Hold ARB. 6 diabetes mellitus-follow CBGs. 7 hyperlipidemia-continue statin. 8. Ischemic cardiomyopathy-will  resume beta blocker and ARB later if blood pressure allows. Patient may need to be followed in CHF clinic.  Olga Millers MD 05/21/2012, 9:23 AM  BP remains borderline; renal function worse compared to previous most likely from hypotension and recent contrast; will add renal dose dopamine. May need milrinone. Olga Millers

## 2012-05-21 NOTE — ED Notes (Signed)
Per lab Troponin 0.33.  Results given to B. Hyacinth Meeker, MD.

## 2012-05-21 NOTE — Progress Notes (Signed)
ANTICOAGULATION CONSULT NOTE - Initial Consult  Pharmacy Consult for Warfarin Indication: atrial fibrillation  Allergies  Allergen Reactions  . Penicillins Other (See Comments)    Unknown reaction    Patient Measurements: Height: 5\' 11"  (180.3 cm) Weight: 263 lb 4.8 oz (119.432 kg) (scale b) IBW/kg (Calculated) : 75.3  Vital Signs: Temp: 97.3 F (36.3 C) (03/22 1045) Temp src: Oral (03/22 1045) BP: 83/58 mmHg (03/22 1045) Pulse Rate: 65 (03/22 1045)  Labs:  Recent Labs  05/18/12 1305  05/19/12 0600 05/20/12 0610 05/21/12 0700 05/21/12 0716 05/21/12 0807  HGB  --   < > 13.5 12.8* 14.3  --   --   HCT  --   < > 39.9 38.5* 41.1  --   --   PLT  --   < > 171 165 174  --   --   APTT  --   --   --   --  48*  --   --   LABPROT  --   --   --  17.0* 21.3*  --  21.9*  INR  --   --   --  1.42 1.93*  --  2.00*  HEPARINUNFRC 0.73*  --  0.12* 0.36  --   --   --   CREATININE  --   --  1.21 1.45* 2.02*  --   --   TROPONINI  --   --   --   --   --  0.33*  --   < > = values in this interval not displayed.  Estimated Creatinine Clearance: 49.8 ml/min (by C-G formula based on Cr of 2.02).   Medical History: Past Medical History  Diagnosis Date  . DM type 2 (diabetes mellitus, type 2)   . Ischemic cardiomyopathy   . HTN (hypertension)   . Hypercholesteremia   . Ventricular tachycardia   . MI (myocardial infarction) 1191,4782 X 2     RCA-T, 70% PL (off CFX), 99% Prox LAD/90% Dist LAD, S/P TAXUS stent x 2  . Anginal pain   . ICD (implantable cardiac defibrillator) in place   . Shortness of breath   . Renal insufficiency   . Nephrolithiasis    Assessment:  62 yo male admitted 05/17/2012 with new-onset atrial flutter s/p ablation DC home 3/21 then re admitted 05/21/2012  With SOB and CP.  Pharmacy consulted to continue warfarin.   Coag: Afib, INR 2.0 after Coumadin 7.5mg  x 2 doses,  yesterday.  CV: HF, hypotension:  Holding losartan, coreg, furo 40 iv daily, simva, sotolol 120  bid  Goal of Therapy:  INR 2-3 Monitor platelets by anticoagulation protocol: Yes   Plan:  - Coumadin 5mg  po today.  -Daily INR  Thank you for allowing pharmacy to be a part of this patients care team.  Lovenia Kim Pharm.D., BCPS  Clinical Pharmacist  05/20/2012 8:18 AM  Pager: [phone]  Phone: [phone]

## 2012-05-21 NOTE — ED Provider Notes (Signed)
History     CSN: [id number]  Arrival date & time 05/21/12  4098   None     Chief Complaint  Patient presents with  . Shortness of Breath    (Consider location/radiation/quality/duration/timing/severity/associated sxs/prior treatment) HPI HX per PT woke up SOB today had recent admit for ablation of afib, no cough, no fevers, no leg swelling, has h/o CAD denies any CP, ABD pain, nausea or diaphoresis, placed on oxygen without sig change in symptoms. Mod in severity, PT states he is not normally short of breath, no leg swelling. Past Medical History  Diagnosis Date  . DM type 2 (diabetes mellitus, type 2)   . Ischemic cardiomyopathy   . HTN (hypertension)   . Hypercholesteremia   . Ventricular tachycardia   . MI (myocardial infarction) 1191,4782 X 2     RCA-T, 70% PL (off CFX), 99% Prox LAD/90% Dist LAD, S/P TAXUS stent x 2  . Anginal pain   . ICD (implantable cardiac defibrillator) in place   . Shortness of breath   . Renal insufficiency   . Nephrolithiasis     Past Surgical History  Procedure Laterality Date  . Percutaneous coronary stent intervention (pci-s)  January 2002    PTCA/Stent Distal RCA  . Cardiac defibrillator placement  2007     Medtronic Maximo VR, serial number [id number]  . Percutaneous coronary stent intervention (pci-s)  June 2002    PTCA/Stent x 3 RCA, thrombolysis - failed  . Percutaneous coronary stent intervention (pci-s)  July 2006    TAXUS stents to prox and distal LAD    No family history on file.  History  Substance Use Topics  . Smoking status: Former Smoker    Quit date: 03/02/2000  . Smokeless tobacco: Not on file  . Alcohol Use: No      Review of Systems  Constitutional: Negative for fever and chills.  HENT: Negative for neck pain and neck stiffness.   Eyes: Negative for pain.  Respiratory: Positive for shortness of breath.   Cardiovascular: Negative for chest pain.  Gastrointestinal: Negative for abdominal pain.   Genitourinary: Negative for dysuria.  Musculoskeletal: Negative for back pain.  Skin: Negative for rash.  Neurological: Negative for headaches.  All other systems reviewed and are negative.    Allergies  Penicillins  Home Medications   Current Outpatient Rx  Name  Route  Sig  Dispense  Refill  . carvedilol (COREG) 3.125 MG tablet   Oral   Take 1 tablet (3.125 mg total) by mouth 2 (two) times daily with a meal.   60 tablet   3   . enoxaparin (LOVENOX) 100 MG/ML injection   Subcutaneous   Inject 1 mL (100 mg total) into the skin every 12 (twelve) hours.   8 Syringe   1   . insulin aspart (NOVOLOG) 100 UNIT/ML injection   Subcutaneous   Inject 30 Units into the skin 2 (two) times daily.   1 vial   3   . insulin regular (NOVOLIN R,HUMULIN R) 100 units/mL injection   Subcutaneous   Inject 0.1 mLs (10 Units total) into the skin 2 (two) times daily before a meal.   10 mL   3   . losartan (COZAAR) 25 MG tablet   Oral   Take 0.5 tablets (12.5 mg total) by mouth daily.   30 tablet   3   . magnesium oxide (MAG-OX) 400 MG tablet   Oral   Take 1 tablet (400 mg total) by mouth  daily.   90 tablet   3   . Multiple Vitamin (MULTIVITAMIN) tablet   Oral   Take 1 tablet by mouth daily.           . pantoprazole (PROTONIX) 40 MG tablet   Oral   Take 1 tablet (40 mg total) by mouth daily at 6 (six) AM.   30 tablet   3   . simvastatin (ZOCOR) 40 MG tablet   Oral   Take 1 tablet (40 mg total) by mouth daily at 6 PM.   30 tablet   3   . sotalol (BETAPACE) 120 MG tablet   Oral   Take 1 tablet (120 mg total) by mouth 2 (two) times daily.   60 tablet   3   . warfarin (COUMADIN) 5 MG tablet   Oral   Take 1.5 tablets (7.5 mg total) by mouth daily with supper.   30 tablet   3     BP 87/62  Pulse 68  Temp(Src) 0 F (-17.8 C)  Resp 33  SpO2 96%  Physical Exam  Constitutional: He is oriented to person, place, and time. He appears well-developed and  well-nourished.  HENT:  Head: Normocephalic and atraumatic.  Eyes: Conjunctivae and EOM are normal. Pupils are equal, round, and reactive to light.  Neck: Trachea normal. Neck supple. No JVD present. No thyromegaly present.  Cardiovascular: Normal rate, regular rhythm, S1 normal, S2 normal and normal pulses.     No systolic murmur is present   No diastolic murmur is present  Pulses:      Radial pulses are 2+ on the right side, and 2+ on the left side.  Pulmonary/Chest: Effort normal and breath sounds normal. He has no wheezes. He has no rhonchi. He has no rales. He exhibits no tenderness.  Abdominal: Soft. Normal appearance and bowel sounds are normal. There is no tenderness. There is no CVA tenderness and negative Murphy's sign.  Musculoskeletal:  calves nontender, no cords or erythema, negative Homans sign  Neurological: He is alert and oriented to person, place, and time. He has normal strength. No cranial nerve deficit or sensory deficit. GCS eye subscore is 4. GCS verbal subscore is 5. GCS motor subscore is 6.  Skin: Skin is warm and dry. No rash noted. He is not diaphoretic.  Psychiatric: His speech is normal.  Cooperative and appropriate    ED Course  Procedures (including critical care time)  Results for orders placed during the hospital encounter of 05/21/12  CBC WITH DIFFERENTIAL      Result Value Range   WBC 11.0 (*) 4.0 - 10.5 K/uL   RBC 5.09  4.22 - 5.81 MIL/uL   Hemoglobin 14.3  13.0 - 17.0 g/dL   HCT 40.9  81.1 - 91.4 %   MCV 80.7  78.0 - 100.0 fL   MCH 28.1  26.0 - 34.0 pg   MCHC 34.8  30.0 - 36.0 g/dL   RDW 78.2  95.6 - 21.3 %   Platelets 174  150 - 400 K/uL   Neutrophils Relative 64  43 - 77 %   Neutro Abs 7.1  1.7 - 7.7 K/uL   Lymphocytes Relative 19  12 - 46 %   Lymphs Abs 2.1  0.7 - 4.0 K/uL   Monocytes Relative 16 (*) 3 - 12 %   Monocytes Absolute 1.8 (*) 0.1 - 1.0 K/uL   Eosinophils Relative 0  0 - 5 %   Eosinophils Absolute 0.0  0.0 -  0.7 K/uL    Basophils Relative 0  0 - 1 %   Basophils Absolute 0.0  0.0 - 0.1 K/uL  POCT I-STAT TROPONIN I      Result Value Range   Troponin i, poc 0.23 (*) 0.00 - 0.08 ng/mL   Comment NOTIFIED PHYSICIAN     Comment 3            Dg Chest 2 View  05/17/2012  *RADIOLOGY REPORT*  Clinical Data: Shortness of breath  CHEST - 2 VIEW  Comparison: 04/25/2010  Findings: Chronic interstitial markings.  Superimposed increased interstitial markings/bronchitic changes.  Vague right upper lobe opacity is possible but considered less likely.  Left basilar atelectasis.  No pleural effusion or pneumothorax.  Cardiomegaly.  Left subclavian ICD.  Degenerative changes of the visualized thoracolumbar spine.  IMPRESSION: Increased interstitial markings/bronchitic changes.  Vague right upper lobe opacity/pneumonia is possible but considered less likely.   Original Report Authenticated By: Charline Bills, M.D.    Dg Lumbar Spine 2-3 Views  05/17/2012  *RADIOLOGY REPORT*  Clinical Data: Fall, low back pain  LUMBAR SPINE - 2-3 VIEW  Comparison: None.  Findings: Five lumbar-type vertebral bodies.  Normal lumbar lordosis.  No evidence of fracture or dislocation.  Vertebral body heights are maintained.  Mild to moderate degenerative changes.  Visualized bony pelvis is intact.  IMPRESSION: No fracture or dislocation is seen.  Mild to moderate degenerative changes.   Original Report Authenticated By: Charline Bills, M.D.    Ct Abdomen Pelvis W Contrast  05/18/2012  *RADIOLOGY REPORT*  Clinical Data: 62 year old male with abdominal, back and pelvic pain following fall.  The patient is anticoagulated.  CT ABDOMEN AND PELVIS WITH CONTRAST  Technique:  Multidetector CT imaging of the abdomen and pelvis was performed following the standard protocol during bolus administration of intravenous contrast.  Contrast: 80mL OMNIPAQUE IOHEXOL 300 MG/ML  SOLN  Comparison: None  Findings: Mild cardiomegaly and pacemaker lead identified. Small bilateral  pleural effusions and bibasilar atelectasis identified.  The liver, spleen, pancreas, adrenal glands and gallbladder are unremarkable.  Scattered areas of wedge shaped renal scarring are noted bilaterally. A wedge shaped area of decreased attenuation within the posterior lateral mid - lower left kidney is identified but does not exhibit parenchymal volume loss as the other areas of scarring.  This may represent an area of ischemia or acute - subacute infarct or less likely pyelonephritis.  The bowel, appendix and bladder are unremarkable. Prostate enlargement is present.  There is no evidence of enlarged lymph nodes, biliary dilatation or abdominal aortic aneurysm.  There is no evidence of intra-abdominal or intrapelvic hematoma or hemorrhage.  There is minimal wedging of the L1 vertebral body and does not have an acute appearance. Moderate degenerative changes at L4-L5 is present.  IMPRESSION: No evidence of intra-abdominal or intrapelvic hemorrhage/hematoma.  Small bilateral pleural effusions with mild bibasilar atelectasis.  Scattered areas of renal scarring bilaterally with one wedge-shaped area in the mid - lower left kidney without significant parenchymal loss - infarct or pyelonephritis is not excluded although this finding is likely subacute to chronic.  Mild L1 vertebral body wedging - does not appear acute but correlate with area of pain.   Original Report Authenticated By: Harmon Pier, M.D.     Date: 05/21/2012  Rate: 71  Rhythm: normal sinus rhythm  QRS Axis: left  Intervals: PR prolonged  ST/T Wave abnormalities: nonspecific ST/T changes  Conduction Disutrbances:first-degree A-V block   Narrative Interpretation:   Old EKG Reviewed:  changes noted prior ECG shows aflutter  7:42 AM d/w cardiologist Dr [name], elevated troponin likely due to recent ablation - will see PT in the ED, INR pending.   MDM  SOB h/o CAD and recent ablation with elevated troponin  ECG no ST chnages  CXR and labs  obtained  CAR consult and evaluation      Sunnie Nielsen, MD 05/22/12 [phone]

## 2012-05-21 NOTE — ED Notes (Signed)
Lab and xray contacted

## 2012-05-21 NOTE — ED Notes (Signed)
Per wife, pt woke at 0400 this morning experiencing SOB, nausea, and generalized weakness. Pt was discharged yesterday from [facility] after having Heart Cath and Ablation. Pt presents with IED. Pt arrived to room at 0655, initial SpO2 of 85% RA, placed on 3LNC, sPO2 remained the same. Pt placed on nonrebreather and SpO2 monitor changed, pt Sat 99%. Pt taken off of non-rebreather and placed on 2LNC, pt Sat is 99% on 2LNC. Pt denies pain at this time, states feels nauseous and SOB. Lungs diminished bilaterally. Pt able to transfer from wheelchair to bed with staff assistance.

## 2012-05-21 NOTE — ED Notes (Signed)
RT paged, made aware of pt, will see/assess.

## 2012-05-21 NOTE — ED Notes (Signed)
Attempted to contact floor rn-busy-advised she would call back.

## 2012-05-22 DIAGNOSIS — I472 Ventricular tachycardia: Secondary | ICD-10-CM | POA: Diagnosis present

## 2012-05-22 DIAGNOSIS — I517 Cardiomegaly: Secondary | ICD-10-CM | POA: Diagnosis present

## 2012-05-22 DIAGNOSIS — I959 Hypotension, unspecified: Secondary | ICD-10-CM | POA: Diagnosis present

## 2012-05-22 DIAGNOSIS — I4729 Other ventricular tachycardia: Secondary | ICD-10-CM | POA: Diagnosis present

## 2012-05-22 DIAGNOSIS — I059 Rheumatic mitral valve disease, unspecified: Secondary | ICD-10-CM | POA: Diagnosis present

## 2012-05-22 DIAGNOSIS — Z880 Allergy status to penicillin: Secondary | ICD-10-CM | POA: Diagnosis not present

## 2012-05-22 DIAGNOSIS — I129 Hypertensive chronic kidney disease with stage 1 through stage 4 chronic kidney disease, or unspecified chronic kidney disease: Secondary | ICD-10-CM | POA: Diagnosis present

## 2012-05-22 DIAGNOSIS — I252 Old myocardial infarction: Secondary | ICD-10-CM | POA: Diagnosis not present

## 2012-05-22 DIAGNOSIS — Z9861 Coronary angioplasty status: Secondary | ICD-10-CM | POA: Diagnosis not present

## 2012-05-22 DIAGNOSIS — Z95810 Presence of automatic (implantable) cardiac defibrillator: Secondary | ICD-10-CM | POA: Diagnosis not present

## 2012-05-22 DIAGNOSIS — I5023 Acute on chronic systolic (congestive) heart failure: Secondary | ICD-10-CM | POA: Diagnosis present

## 2012-05-22 DIAGNOSIS — Z7901 Long term (current) use of anticoagulants: Secondary | ICD-10-CM | POA: Diagnosis not present

## 2012-05-22 DIAGNOSIS — N179 Acute kidney failure, unspecified: Secondary | ICD-10-CM | POA: Diagnosis present

## 2012-05-22 DIAGNOSIS — I2510 Atherosclerotic heart disease of native coronary artery without angina pectoris: Secondary | ICD-10-CM | POA: Diagnosis present

## 2012-05-22 DIAGNOSIS — I4892 Unspecified atrial flutter: Secondary | ICD-10-CM | POA: Diagnosis present

## 2012-05-22 DIAGNOSIS — E7800 Pure hypercholesterolemia, unspecified: Secondary | ICD-10-CM | POA: Diagnosis present

## 2012-05-22 DIAGNOSIS — Z794 Long term (current) use of insulin: Secondary | ICD-10-CM | POA: Diagnosis not present

## 2012-05-22 DIAGNOSIS — Z87442 Personal history of urinary calculi: Secondary | ICD-10-CM | POA: Diagnosis not present

## 2012-05-22 DIAGNOSIS — E119 Type 2 diabetes mellitus without complications: Secondary | ICD-10-CM | POA: Diagnosis present

## 2012-05-22 DIAGNOSIS — Z87891 Personal history of nicotine dependence: Secondary | ICD-10-CM | POA: Diagnosis not present

## 2012-05-22 DIAGNOSIS — I2589 Other forms of chronic ischemic heart disease: Secondary | ICD-10-CM | POA: Diagnosis present

## 2012-05-22 DIAGNOSIS — I509 Heart failure, unspecified: Secondary | ICD-10-CM | POA: Diagnosis present

## 2012-05-22 LAB — URINALYSIS, ROUTINE W REFLEX MICROSCOPIC
Glucose, UA: NEGATIVE mg/dL
Hgb urine dipstick: NEGATIVE
Ketones, ur: NEGATIVE mg/dL
Leukocytes, UA: NEGATIVE
Protein, ur: NEGATIVE mg/dL
pH: 5 (ref 5.0–8.0)

## 2012-05-22 LAB — CBC
HCT: 38.7 % — ABNORMAL LOW (ref 39.0–52.0)
MCH: 27.3 pg (ref 26.0–34.0)
MCV: 78.3 fL (ref 78.0–100.0)
Platelets: 185 10*3/uL (ref 150–400)
RBC: 4.94 MIL/uL (ref 4.22–5.81)
WBC: 9.4 10*3/uL (ref 4.0–10.5)

## 2012-05-22 LAB — GLUCOSE, CAPILLARY
Glucose-Capillary: 168 mg/dL — ABNORMAL HIGH (ref 70–99)
Glucose-Capillary: 196 mg/dL — ABNORMAL HIGH (ref 70–99)
Glucose-Capillary: 184 mg/dL — ABNORMAL HIGH (ref 70–99)
Glucose-Capillary: 189 mg/dL — ABNORMAL HIGH (ref 70–99)

## 2012-05-22 LAB — BASIC METABOLIC PANEL
CO2: 19 mEq/L (ref 19–32)
Calcium: 9.2 mg/dL (ref 8.4–10.5)
Chloride: 94 mEq/L — ABNORMAL LOW (ref 96–112)
Glucose, Bld: 181 mg/dL — ABNORMAL HIGH (ref 70–99)
Sodium: 129 mEq/L — ABNORMAL LOW (ref 135–145)

## 2012-05-22 MED ORDER — INSULIN ASPART 100 UNIT/ML ~~LOC~~ SOLN
0.0000 [IU] | Freq: Every day | SUBCUTANEOUS | Status: DC
  Administered 2012-05-25 – 2012-05-27 (×3): 2 [IU] via SUBCUTANEOUS

## 2012-05-22 MED ORDER — INSULIN ASPART 100 UNIT/ML ~~LOC~~ SOLN
0.0000 [IU] | Freq: Three times a day (TID) | SUBCUTANEOUS | Status: DC
  Administered 2012-05-22 – 2012-05-23 (×3): 3 [IU] via SUBCUTANEOUS
  Administered 2012-05-23: 8 [IU] via SUBCUTANEOUS
  Administered 2012-05-23: 3 [IU] via SUBCUTANEOUS
  Administered 2012-05-24 (×2): 5 [IU] via SUBCUTANEOUS
  Administered 2012-05-24 – 2012-05-25 (×2): 3 [IU] via SUBCUTANEOUS
  Administered 2012-05-25 – 2012-05-26 (×4): 5 [IU] via SUBCUTANEOUS
  Administered 2012-05-27: 8 [IU] via SUBCUTANEOUS
  Administered 2012-05-27: 3 [IU] via SUBCUTANEOUS
  Administered 2012-05-27: 2 [IU] via SUBCUTANEOUS
  Administered 2012-05-28 (×2): 5 [IU] via SUBCUTANEOUS
  Administered 2012-05-28 – 2012-05-29 (×2): 3 [IU] via SUBCUTANEOUS
  Administered 2012-05-29: 8 [IU] via SUBCUTANEOUS
  Administered 2012-05-29: 3 [IU] via SUBCUTANEOUS
  Administered 2012-05-30 (×3): 5 [IU] via SUBCUTANEOUS
  Administered 2012-05-31: 8 [IU] via SUBCUTANEOUS
  Administered 2012-05-31: 5 [IU] via SUBCUTANEOUS

## 2012-05-22 MED ORDER — WARFARIN SODIUM 1 MG PO TABS
1.0000 mg | ORAL_TABLET | Freq: Once | ORAL | Status: AC
  Administered 2012-05-22: 1 mg via ORAL
  Filled 2012-05-22: qty 1

## 2012-05-22 NOTE — Progress Notes (Signed)
ANTICOAGULATION CONSULT NOTE - Follow Up Consult  Pharmacy Consult for Warfarin Indication: atrial fibrillation  Allergies  Allergen Reactions  . Penicillins Other (See Comments)    Unknown reaction    Patient Measurements: Height: 5\' 11"  (180.3 cm) Weight: 236 lb 9.6 oz (107.321 kg) (scale b) IBW/kg (Calculated) : 75.3  Vital Signs: Temp: 98 F (36.7 C) (03/23 0847) Temp src: Oral (03/23 0847) BP: 95/64 mmHg (03/23 0847) Pulse Rate: 64 (03/23 0847)  Labs:  Recent Labs  05/20/12 0610 05/21/12 0700  05/21/12 0807 05/21/12 1100 05/21/12 1625 05/21/12 2203 05/22/12 0455  HGB 12.8* 14.3  --   --   --   --   --  13.5  HCT 38.5* 41.1  --   --   --   --   --  38.7*  PLT 165 174  --   --   --   --   --  185  APTT  --  48*  --   --   --   --   --   --   LABPROT 17.0* 21.3*  --  21.9*  --   --   --  30.5*  INR 1.42 1.93*  --  2.00*  --   --   --  3.13*  HEPARINUNFRC 0.36  --   --   --   --   --   --   --   CREATININE 1.45* 2.02*  --   --   --   --   --  2.53*  TROPONINI  --   --   < >  --  <0.30 0.33* 0.35*  --   < > = values in this interval not displayed.  Estimated Creatinine Clearance: 37.7 ml/min (by C-G formula based on Cr of 2.53).  Assessment:  62 yo male admitted 05/17/2012 with new-onset atrial flutter s/p ablation DC home 3/21 then re admitted 05/21/2012  With SOB and CP.  Pharmacy consulted to continue warfarin.   Coag: Afib, INR 3.13 after warfarin initiation on 3/20, 7.5 mg x 2 doses (1 at home) then 5 mg.  No bleeding noted,  CV: HF, hypotension:  Holding losartan, coreg, furo 40 iv daily, simva, sotolol 120 bid, dopamine added 3/22; K 4.0, SBP 90s, HR 60s  Endo: DM, standing novolog regimen, RN holding bc patient vomiting, glucose < 200   Renal: SCr increased significantly now 2.53 (baseline 1), CrCl 37  Goal of Therapy:  INR 2-3 Monitor platelets by anticoagulation protocol: Yes   Plan:  - Coumadin 1 mg po today.  -Daily INR - Given declining renal  function, consider decreasing sotolol dose to daily  Thank you for allowing pharmacy to be a part of this patients care team.  Lovenia Kim Pharm.D., BCPS  Clinical Pharmacist  05/20/2012 8:18 AM  Pager: [phone]  Phone: [phone]

## 2012-05-22 NOTE — Care Management Utilization Note (Signed)
UR completed 

## 2012-05-22 NOTE — Progress Notes (Signed)
   Subjective:  Denies CP; mild dyspnea   Objective:  Filed Vitals:   05/21/12 2021 05/21/12 2059 05/22/12 0544 05/22/12 0847  BP: 90/64 104/72 91/62 95/64   Pulse: 74 103 72 64  Temp: 98.1 F (36.7 C)  97.6 F (36.4 C) 98 F (36.7 C)  TempSrc: Axillary  Oral Oral  Resp: 18 18 18 20   Height:      Weight:   236 lb 9.6 oz (107.321 kg)   SpO2: 95%  100% 96%    Intake/Output from previous day:  Intake/Output Summary (Last 24 hours) at 05/22/12 1037 Last data filed at 05/22/12 1000  Gross per 24 hour  Intake   1040 ml  Output   1125 ml  Net    -85 ml    Physical Exam: Physical exam: Well-developed well-nourished in no acute distress.  Skin is warm and dry.  HEENT is normal.  Neck is supple.  Chest mildly diminished breath sounds bases Cardiovascular exam is regular rate and rhythm.  Abdominal exam nontender or distended. No masses palpated. Extremities show trace edema. neuro grossly intact    Lab Results: Basic Metabolic Panel:  Recent Labs  84/69/62 0700 05/22/12 0455  NA 132* 129*  K 5.1 4.0  CL 98 94*  CO2 20 19  GLUCOSE 142* 181*  BUN 34* 44*  CREATININE 2.02* 2.53*  CALCIUM 9.7 9.2   CBC:  Recent Labs  05/21/12 0700 05/22/12 0455  WBC 11.0* 9.4  NEUTROABS 7.1  --   HGB 14.3 13.5  HCT 41.1 38.7*  MCV 80.7 78.3  PLT 174 185   Cardiac Enzymes:  Recent Labs  05/21/12 1100 05/21/12 1625 05/21/12 2203  TROPONINI <0.30 0.33* 0.35*     Assessment/Plan:  1 acute renal failure-the patient's renal function is worsening. This may be related to a combination of recent hypotension, diuresis and particularly contrast used at time of recent abdominal CT. I will ask nephrology to review. Hold diuretics. 2 acute on chronic systolic heart failure-beta blocker and ARB are on hold because of hypotension. Continue low-dose dopamine. Hold diuretics given worsening renal insufficiency. Will consider milrinone if renal function deteriorates further. 3  mildly elevated troponin-most likely from recent ablation. Plan outpatient Myoview. 4 status post recent atrial flutter ablation-patient remains in sinus rhythm. Continue Coumadin. 5 ventricular tachycardia-hold sotalol given worsening renal insufficiency. ICD in place. 6 diabetes mellitus- SSI to cover for now   Olga Millers 05/22/2012, 10:37 AM

## 2012-05-22 NOTE — Consult Note (Signed)
[name] [name] 05/22/2012 [name] [name] Requesting Physician:  Dr [name]  Reason for Consult:  Acute renal failure HPI: The patient is a 62 y.o. year-old with hx of [name], HTN, MI s/p stent, Vtach with ICD in place, and ischemic CM.   Patient was admitted on 3/18 for new onset SOB and atrial flutter. He had also sustained a fall during a hunting trip and fell onto his back about 10 days PTA.  He had an abd CT on 3/19 with contrast for back and abd pain.  On 3/20 he underwent TEE-guided EPS and RF ablation without complication.  His creatinine on admission 3/19 was 1.03, on 3/20 was 1.21 and on day of discharge was up to 1.45.  He was discharged but returned a day later c/o SOB, orthpnea and LE edema.  Creat was up at 2.02 and pt was admitted and treated with IV lasix 40 mg x 2 with good UOP. BP's meds (coreg, losartan) were held due to low BP's in the 90's.  Creat today is up to 2.5 and lasix is being held because of rise in creatinine. Getting IV dopamine now.    ROS  denies cp, cough. Sob is better  no jt pain or swelling  +LE edema bilat  no HA, visual chg or hallucination  no confusion  no abd pain, n/v/[name]  no hx of renal failure, +kidney stones 20 yrs ago   Past Medical History:  Past Medical History  Diagnosis Date  . DM type 2 (diabetes mellitus, type 2)   . Ischemic cardiomyopathy   . HTN (hypertension)   . Hypercholesteremia     ablation  . Ventricular tachycardia   . MI (myocardial infarction) 1610,9604 X 2     RCA-T, 70% PL (off CFX), 99% Prox LAD/90% Dist LAD, S/P TAXUS stent x 2  . Anginal pain   . ICD (implantable cardiac defibrillator) in place   . Shortness of breath   . Renal insufficiency   . Nephrolithiasis   . S/P ablation of atrial flutter 04/2012    Past Surgical History:  Past Surgical History  Procedure Laterality Date  . Percutaneous coronary stent intervention (pci-s)  January 2002    PTCA/Stent Distal RCA  . Cardiac defibrillator placement   2007     Medtronic Maximo VR, serial number [id number]  . Percutaneous coronary stent intervention (pci-s)  June 2002    PTCA/Stent x 3 RCA, thrombolysis - failed  . Percutaneous coronary stent intervention (pci-s)  July 2006    TAXUS stents to prox and distal LAD    Family History: History reviewed. No pertinent family history. Social History:  reports that he quit smoking about 12 years ago. He does not have any smokeless tobacco history on file. He reports that he does not drink alcohol or use illicit drugs.  Allergies:  Allergies  Allergen Reactions  . Penicillins Other (See Comments)    Unknown reaction    Home medications: Prior to Admission medications   Medication Sig Start Date End Date Taking? Authorizing Provider  carvedilol (COREG) 3.125 MG tablet Take 1 tablet (3.125 mg total) by mouth 2 (two) times daily with a meal. 05/20/12  Yes [name], PA-C  enoxaparin (LOVENOX) 100 MG/ML injection Inject 1 mL (100 mg total) into the skin every 12 (twelve) hours. 05/20/12  Yes [name], PA-C  insulin aspart (NOVOLOG) 100 UNIT/ML injection Inject 30 Units into the skin 2 (two) times daily. 05/20/12  Yes [name], PA-C  insulin regular (NOVOLIN R,HUMULIN R) 100 units/mL injection Inject 0.1 mLs (10 Units total) into the skin 2 (two) times daily before a meal. 05/20/12  Yes [name], PA-C  losartan (COZAAR) 25 MG tablet Take 0.5 tablets (12.5 mg total) by mouth daily. 05/20/12  Yes [name], PA-C  magnesium oxide (MAG-OX) 400 MG tablet Take 1 tablet (400 mg total) by mouth daily. 05/29/10  Yes Lewayne Bunting, MD  Multiple Vitamin (MULTIVITAMIN) tablet Take 1 tablet by mouth daily.     Yes Historical Provider, MD  pantoprazole (PROTONIX) 40 MG tablet Take 1 tablet (40 mg total) by mouth daily at 6 (six) AM. 05/20/12  Yes [name], PA-C  simvastatin (ZOCOR) 40 MG tablet Take 1 tablet (40 mg total) by mouth daily at 6 PM. 05/20/12  Yes Brooke O  Edmisten, PA-C  sotalol (BETAPACE) 120 MG tablet Take 1 tablet (120 mg total) by mouth 2 (two) times daily. 05/20/12  Yes [name], PA-C  warfarin (COUMADIN) 5 MG tablet Take 1.5 tablets (7.5 mg total) by mouth daily with supper. 05/20/12  Yes [name], PA-C    Labs: Basic Metabolic Panel:  Recent Labs Lab 05/17/12 [phone] 05/18/12 0536 05/19/12 0600 05/20/12 0610 05/21/12 0700 05/22/12 0455  NA 137 134* 137 135 132* 129*  K 4.5 4.3 4.8 4.4 5.1 4.0  CL 104 100 102 100 98 94*  CO2  --  21 23 23 20 19   GLUCOSE 264* 172* 119* 211* 142* 181*  BUN 17 21 22  27* 34* 44*  CREATININE 1.00 1.03 1.21 1.45* 2.02* 2.53*  CALCIUM  --  9.4 9.3 9.1 9.7 9.2   Liver Function Tests:  Recent Labs Lab 05/18/12 0536 05/21/12 0700  AST 23 39*  ALT 16 23  ALKPHOS 68 102  BILITOT 1.0 1.3*  PROT 7.0 7.3  ALBUMIN 3.1* 3.2*   No results found for this basename: LIPASE, AMYLASE,  in the last 168 hours No results found for this basename: AMMONIA,  in the last 168 hours CBC:  Recent Labs Lab 05/17/12 0910  05/19/12 0600 05/20/12 0610 05/21/12 0700 05/22/12 0455  WBC 4.5  < > 5.7 6.8 11.0* 9.4  NEUTROABS 2.2  --   --   --  7.1  --   HGB 14.4  < > 13.5 12.8* 14.3 13.5  HCT 42.6  < > 39.9 38.5* 41.1 38.7*  MCV 80.4  < > 79.3 81.4 80.7 78.3  PLT 188  < > 171 165 174 185  < > = values in this interval not displayed. PT/INR: @LABRCNTIP (inr:5) Cardiac Enzymes: ) Recent Labs Lab 05/18/12 0536 05/21/12 0716 05/21/12 1100 05/21/12 1625 05/21/12 2203  TROPONINI <0.30 0.33* <0.30 0.33* 0.35*   CBG:  Recent Labs Lab 05/20/12 1053 05/21/12 1107 05/21/12 1626 05/21/12 2026 05/22/12 0526  GLUCAP 200* 142* 166* 156* 168*     Physical Exam:  Blood pressure 106/67, pulse 70, temperature 98.1 F (36.7 C), temperature source Oral, resp. rate 20, height 5\' 11"  (1.803 m), weight 107.321 kg (236 lb 9.6 oz), SpO2 96.00%.  Gen: alert Skin: no rash, cyanosis HEENT:  EOMI,  sclera anicteric, throat clear Neck: flat neck veins, no JVD, no LAN Chest: crackles R base, L mostly clear CV: regular, no rub or gallop, no carotid or femoral bruits, pedal pulses intact Abdomen: soft, obese, nontender, no masses or ascites, no HSM Ext: + pitting LE edema bilat about 1-2+ below the knees, mild nonpitting edema of  dependent thighs bilat, no joint effusion or deformity, no gangrene or ulceration Neuro: alert, Ox3, no focal deficit  CT abdomen 3/19 w IV contrast 80cc for abd and back pain after a fall  >> mild CM, pacemaker lead, normal bowel/liver/pancreas/GB/spleen. Scattered areas of wedge shaped renal scarring bilat. UA >> not done CXR 3/18 and 3/22 >> +vasc congestion, no edema, PM in place   Impression/Plan 1. Acute kidney injury- most likely a combination of IV contrast on 3/19 with associated LV dysfunction and borderline hypotension. There are a few wedge shaped renal infarcts on CT scan but I don't think these are relevant to AKI.  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 Agree with your management including holding BP meds, dopamine IV, holding lasix today with rise in creatinine, and consideration of inotropes if not improving soon. Will follow 2. Ischemic CM EF 20-25% 3. Mx MI, s/p stent 4. [name] 5. Hx VT with ICD in place 6. Vol excess / LE edema 7. Aflutter, s/p recent ablation procedure     Vinson Moselle  MD [facility] [phone] pgr    [phone] cell 05/22/2012, 4:24 PM

## 2012-05-22 NOTE — Progress Notes (Signed)
Pharmacist Heart Failure Core Measure Documentation  Assessment: [name] [name] has an EF documented as 25%; 05/19/12  by ECHO.  Rationale: Heart failure patients with left ventricular systolic dysfunction (LVSD) and an EF < 40% should be prescribed an angiotensin converting enzyme inhibitor (ACEI) or angiotensin receptor blocker (ARB) at discharge unless a contraindication is documented in the medical record.  This patient is not currently on an ACEI or ARB for HF.  This note is being placed in the record in order to provide documentation that a contraindication to the use of these agents is present for this encounter.  ACE Inhibitor or Angiotensin Receptor Blocker is contraindicated (specify all that apply)  []   ACEI allergy AND ARB allergy []   Angioedema []   Moderate or severe aortic stenosis []   Hyperkalemia [x]   Hypotension []   Renal artery stenosis []   Worsening renal function, preexisting renal disease or dysfunction   [name] [name] 05/22/2012 7:52 AM

## 2012-05-23 ENCOUNTER — Inpatient Hospital Stay (HOSPITAL_COMMUNITY): Payer: Medicaid Other

## 2012-05-23 LAB — BASIC METABOLIC PANEL
BUN: 45 mg/dL — ABNORMAL HIGH (ref 6–23)
CO2: 22 mEq/L (ref 19–32)
Calcium: 9.5 mg/dL (ref 8.4–10.5)
Creatinine, Ser: 2.18 mg/dL — ABNORMAL HIGH (ref 0.50–1.35)
GFR calc non Af Amer: 31 mL/min — ABNORMAL LOW (ref >90)
Glucose, Bld: 202 mg/dL — ABNORMAL HIGH (ref 70–99)
Sodium: 134 mEq/L — ABNORMAL LOW (ref 135–145)

## 2012-05-23 LAB — CBC
HCT: 40.7 % (ref 39.0–52.0)
Hemoglobin: 14.1 g/dL (ref 13.0–17.0)
MCH: 27.6 pg (ref 26.0–34.0)
MCHC: 34.6 g/dL (ref 30.0–36.0)
MCV: 79.6 fL (ref 78.0–100.0)
RDW: 13.9 % (ref 11.5–15.5)

## 2012-05-23 LAB — GLUCOSE, CAPILLARY
Glucose-Capillary: 183 mg/dL — ABNORMAL HIGH (ref 70–99)
Glucose-Capillary: 190 mg/dL — ABNORMAL HIGH (ref 70–99)

## 2012-05-23 LAB — PROTIME-INR: INR: 3.7 — ABNORMAL HIGH (ref 0.00–1.49)

## 2012-05-23 NOTE — Progress Notes (Signed)
05/23/12 MD, please consider startiing 70/30 here at 15 units bid.   Pt can take Reli-on 70/30 ([employer] brand of traditional 70/30 N/R)  (Otherwise, please add NPH at HS, 10 units to normalize fasting glucose.) in addition to correction tidwc. Thank you, Lenor Coffin, RN, CNS, Diabetes Coordinator [phone])

## 2012-05-23 NOTE — Progress Notes (Signed)
Patient: [name] [name] / Admit Date: 05/21/2012 / Date of Encounter: 05/23/2012, 7:12 AM   Subjective  Denies CP, dyspnea. Was able to lay in bed last night with mild incline in head of bed. LEE still there.   Objective   Telemetry: NSR occ PVCs PACs Physical Exam: Filed Vitals:   05/23/12 0531  BP: 104/67  Pulse: 65  Temp: 97.4 F (36.3 C)  Resp: 19   General: Well developed, well nourished [name] in no acute distress. Head: Normocephalic, atraumatic, sclera non-icteric, no xanthomas, nares are without discharge. Neck: JVD not elevated. Lungs: Diminished breath sounds throughout. Breathing is unlabored. Heart: RRR S1 S2 without murmurs, rubs, or gallops.  Abdomen: Soft, non-tender, non-distended with normoactive bowel sounds. No hepatomegaly. No rebound/guarding. No obvious abdominal masses. Msk:  Strength and tone appear normal for age. Extremities: No clubbing or cyanosis. Tr-1+ bilat LE edema.  Distal pedal pulses are 2+ and equal bilaterally. Neuro: Alert and oriented X 3. Moves all extremities spontaneously. Psych:  Responds to questions appropriately with a normal affect.    Intake/Output Summary (Last 24 hours) at 05/23/12 5284 Last data filed at 05/23/12 0551  Gross per 24 hour  Intake    720 ml  Output   2025 ml  Net  -1305 ml    Inpatient Medications:  . insulin aspart  0-15 Units Subcutaneous TID WC  . insulin aspart  0-5 Units Subcutaneous QHS  . multivitamin with minerals  1 tablet Oral Daily  . pantoprazole  40 mg Oral Q0600  . simvastatin  40 mg Oral q1800  . sodium chloride  3 mL Intravenous Q12H  . Warfarin - Pharmacist Dosing Inpatient   Does not apply q1800    Labs:  Recent Labs  05/22/12 0455 05/23/12 0450  NA 129* 134*  K 4.0 5.1  CL 94* 99  CO2 19 22  GLUCOSE 181* 202*  BUN 44* 45*  CREATININE 2.53* 2.18*  CALCIUM 9.2 9.5  MG  --  2.2    Recent Labs  05/21/12 0700  AST 39*  ALT 23  ALKPHOS 102  BILITOT 1.3*  PROT 7.3    ALBUMIN 3.2*    Recent Labs  05/21/12 0700 05/22/12 0455 05/23/12 0450  WBC 11.0* 9.4 6.1  NEUTROABS 7.1  --   --   HGB 14.3 13.5 14.1  HCT 41.1 38.7* 40.7  MCV 80.7 78.3 79.6  PLT 174 185 201    Recent Labs  05/21/12 0716 05/21/12 1100 05/21/12 1625 05/21/12 2203  TROPONINI 0.33* <0.30 0.33* 0.35*   No components found with this basename: POCBNP,  No results found for this basename: HGBA1C,  in the last 72 hours No results found for this basename: CHOL, HDL, LDLCALC, TRIG, CHOLHDL,  in the last 72 hours  Radiology/Studies:  Dg Chest 2 View  05/17/2012  *RADIOLOGY REPORT*  Clinical Data: Shortness of breath  CHEST - 2 VIEW  Comparison: 04/25/2010  Findings: Chronic interstitial markings.  Superimposed increased interstitial markings/bronchitic changes.  Vague right upper lobe opacity is possible but considered less likely.  Left basilar atelectasis.  No pleural effusion or pneumothorax.  Cardiomegaly.  Left subclavian ICD.  Degenerative changes of the visualized thoracolumbar spine.  IMPRESSION: Increased interstitial markings/bronchitic changes.  Vague right upper lobe opacity/pneumonia is possible but considered less likely.   Original Report Authenticated By: Charline Bills, M.D.    Dg Lumbar Spine 2-3 Views  05/17/2012  *RADIOLOGY REPORT*  Clinical Data: Fall, low back pain  LUMBAR SPINE -  2-3 VIEW  Comparison: None.  Findings: Five lumbar-type vertebral bodies.  Normal lumbar lordosis.  No evidence of fracture or dislocation.  Vertebral body heights are maintained.  Mild to moderate degenerative changes.  Visualized bony pelvis is intact.  IMPRESSION: No fracture or dislocation is seen.  Mild to moderate degenerative changes.   Original Report Authenticated By: Charline Bills, M.D.    Ct Abdomen Pelvis W Contrast  05/18/2012  *RADIOLOGY REPORT*  Clinical Data: 62 year old male with abdominal, back and pelvic pain following fall.  The patient is anticoagulated.  CT  ABDOMEN AND PELVIS WITH CONTRAST  Technique:  Multidetector CT imaging of the abdomen and pelvis was performed following the standard protocol during bolus administration of intravenous contrast.  Contrast: 80mL OMNIPAQUE IOHEXOL 300 MG/ML  SOLN  Comparison: None  Findings: Mild cardiomegaly and pacemaker lead identified. Small bilateral pleural effusions and bibasilar atelectasis identified.  The liver, spleen, pancreas, adrenal glands and gallbladder are unremarkable.  Scattered areas of wedge shaped renal scarring are noted bilaterally. A wedge shaped area of decreased attenuation within the posterior lateral mid - lower left kidney is identified but does not exhibit parenchymal volume loss as the other areas of scarring.  This may represent an area of ischemia or acute - subacute infarct or less likely pyelonephritis.  The bowel, appendix and bladder are unremarkable. Prostate enlargement is present.  There is no evidence of enlarged lymph nodes, biliary dilatation or abdominal aortic aneurysm.  There is no evidence of intra-abdominal or intrapelvic hematoma or hemorrhage.  There is minimal wedging of the L1 vertebral body and does not have an acute appearance. Moderate degenerative changes at L4-L5 is present.  IMPRESSION: No evidence of intra-abdominal or intrapelvic hemorrhage/hematoma.  Small bilateral pleural effusions with mild bibasilar atelectasis.  Scattered areas of renal scarring bilaterally with one wedge-shaped area in the mid - lower left kidney without significant parenchymal loss - infarct or pyelonephritis is not excluded although this finding is likely subacute to chronic.  Mild L1 vertebral body wedging - does not appear acute but correlate with area of pain.   Original Report Authenticated By: Harmon Pier, M.D.    Dg Chest Portable 1 View  05/21/2012  *RADIOLOGY REPORT*  Clinical Data: Shortness of breath  PORTABLE CHEST - 1 VIEW  Comparison: 05/17/2012  Findings: Poor inspiratory effect.   Mild cardiac enlargement stable.  Single lead pacer with a pulse generator over left thorax, stable.  Mild vascular congestion.  IMPRESSION: Mild vascular congestion.  No evidence of focal consolidation or pulmonary edema.   Original Report Authenticated By: Esperanza Heir, M.D.      Assessment and Plan  1. Acute kidney injury - felt due to combination of IV contrast, LV dysfunction, borderline hypotension - wedge-shaped infarcts on CT last admission but renal doesn't think these are relevant to AKI. BP meds on hold, on IV dopamine, Lasix on hold. Renal US on hold. Appreciate renal input.  2. Acute on chronic systolic CHF - diuretics and antihypertensives on hold due to AKI & hypotension. Cr improving but BP still borderline. 3. Mod-severe MR by echo 04/2012 - ? Also contributing to #2. 4. Mildly elevated troponin with history of CAD - troponin felt most likely from recent ablation. Plan outpatient Myoview. Not on ASA due to being on Coumadin. BB on hold from hypotension. Consider changing statin to lipitor given renal insuff. 5. Atrial flutter s/p RFA ablation this month - remains in NSR. Coumadin per pharmacy. 6. [name]/ICD - sotalol on  hold given worsening renal failure. Timing of re-initiation to be determined. 7. Diabetes mellitus - SSI for now, can consider resumption of regular insulin (home insulin had been held).  Signed, Ronie Spies PA-C As above; patient seen and examined; Renal function improving. Mildly volume overloaded on examination. For now I will continue to hold his diuretics. Continue renal dose dopamine. We'll resume Lasix tomorrow pending clinical examination and renal function. Renal insufficiency felt most likely secondary to a combination of hypotension, diuresis and recent contrast for abdominal CT. Continue to hold ARB and beta blocker. We'll try to resume later as blood pressure allows. Continue Coumadin status post ablation. Olga Millers 9:46 AM

## 2012-05-23 NOTE — Progress Notes (Signed)
Subjective:  No new complaints.  Good UOP with dopamine- no lasix needed Objective Vital signs in last 24 hours: Filed Vitals:   05/22/12 0847 05/22/12 1300 05/22/12 2140 05/23/12 0531  BP: 95/64 106/67 92/62 104/67  Pulse: 64 70 68 65  Temp: 98 F (36.7 C) 98.1 F (36.7 C) 98.5 F (36.9 C) 97.4 F (36.3 C)  TempSrc: Oral Oral Oral Oral  Resp: 20 20 18 19   Height:      Weight:    106.7 kg (235 lb 3.7 oz)  SpO2: 96% 96% 98% 98%   Weight change: -0.621 kg (-1 lb 5.9 oz)  Intake/Output Summary (Last 24 hours) at 05/23/12 1101 Last data filed at 05/23/12 1000  Gross per 24 hour  Intake  786.6 ml  Output   2025 ml  Net -1238.4 ml   Labs: Basic Metabolic Panel:  Recent Labs Lab 05/21/12 0700 05/22/12 0455 05/23/12 0450  NA 132* 129* 134*  K 5.1 4.0 5.1  CL 98 94* 99  CO2 20 19 22   GLUCOSE 142* 181* 202*  BUN 34* 44* 45*  CREATININE 2.02* 2.53* 2.18*  CALCIUM 9.7 9.2 9.5   Liver Function Tests:  Recent Labs Lab 05/18/12 0536 05/21/12 0700  AST 23 39*  ALT 16 23  ALKPHOS 68 102  BILITOT 1.0 1.3*  PROT 7.0 7.3  ALBUMIN 3.1* 3.2*   No results found for this basename: LIPASE, AMYLASE,  in the last 168 hours No results found for this basename: AMMONIA,  in the last 168 hours CBC:  Recent Labs Lab 05/17/12 0910  05/19/12 0600 05/20/12 0610 05/21/12 0700 05/22/12 0455 05/23/12 0450  WBC 4.5  < > 5.7 6.8 11.0* 9.4 6.1  NEUTROABS 2.2  --   --   --  7.1  --   --   HGB 14.4  < > 13.5 12.8* 14.3 13.5 14.1  HCT 42.6  < > 39.9 38.5* 41.1 38.7* 40.7  MCV 80.4  < > 79.3 81.4 80.7 78.3 79.6  PLT 188  < > 171 165 174 185 201  < > = values in this interval not displayed. Cardiac Enzymes:  Recent Labs Lab 05/18/12 0536 05/21/12 0716 05/21/12 1100 05/21/12 1625 05/21/12 2203  TROPONINI <0.30 0.33* <0.30 0.33* 0.35*   CBG:  Recent Labs Lab 05/22/12 0526 05/22/12 1204 05/22/12 1636 05/22/12 2146 05/23/12 0549  GLUCAP 168* 196* 184* 189* 208*     Iron Studies: No results found for this basename: IRON, TIBC, TRANSFERRIN, FERRITIN,  in the last 72 hours Studies/Results: US Renal  05/23/2012  *RADIOLOGY REPORT*  Clinical Data: Acute kidney infection or insult.  RENAL/URINARY TRACT ULTRASOUND COMPLETE  Comparison:  CT abdomen 05/18/2012  Findings:  Right Kidney:  Right kidney measures up to 13.4 cm in length. There is normal echogenicity of the right kidney.  There is a round hypoechoic structure in the upper pole that measures 1.2 cm and suggestive for a cyst. Negative for hydronephrosis.  Left Kidney:  Left kidney measures 13.1 cm in length.  Echogenicity of the left kidney is grossly normal. The lower pole is difficult to visualize.  Negative for hydronephrosis.  Bladder:  Normal appearance of the urinary bladder.  IMPRESSION: No acute abnormalities.  Right renal cyst.   Original Report Authenticated By: Richarda Overlie, M.D.    Medications: Infusions: . DOPamine 2.993 mcg/kg/min (05/23/12 0551)    Scheduled Medications: . insulin aspart  0-15 Units Subcutaneous TID WC  . insulin aspart  0-5 Units Subcutaneous QHS  .  multivitamin with minerals  1 tablet Oral Daily  . pantoprazole  40 mg Oral Q0600  . simvastatin  40 mg Oral q1800  . sodium chloride  3 mL Intravenous Q12H  . Warfarin - Pharmacist Dosing Inpatient   Does not apply q1800    have reviewed scheduled and prn medications.  Physical Exam: General: alert, NAD Heart: irreg Lungs: decreased BS at bases Abdomen: soft, nontender Extremities: pitting edema  I Assessment/ Plan: Pt is a 62 y.o. yo male who was admitted on 05/21/2012 with  A on CKD in the setting of decompensated heart failure with A flutter  Assessment/Plan: 1. AKI- secondary to contrast and hemodynamic changes. seems to be improving with improved renal perfusion.  I would keep on dopamine for now.  Also seems to be autodiuresing 2. HTN/vol- autodiuresing.  No BP meds needed, on dopamine for support  3.  Anemia- not an issue    Eddi Hymes A   05/23/2012,11:01 AM  LOS: 2 days

## 2012-05-23 NOTE — Progress Notes (Signed)
ANTICOAGULATION CONSULT NOTE - Follow Up Consult  Pharmacy Consult for Warfarin Indication: atrial fibrillation  Allergies  Allergen Reactions  . Penicillins Other (See Comments)    Unknown reaction    Patient Measurements: Height: 5\' 11"  (180.3 cm) Weight: 235 lb 3.7 oz (106.7 kg) IBW/kg (Calculated) : 75.3  Vital Signs: Temp: 97.7 F (36.5 C) (03/24 1348) Temp src: Oral (03/24 1348) BP: 90/60 mmHg (03/24 1508) Pulse Rate: 68 (03/24 1500)  Labs:  Recent Labs  05/21/12 0700  05/21/12 0807 05/21/12 1100 05/21/12 1625 05/21/12 2203 05/22/12 0455 05/23/12 0450  HGB 14.3  --   --   --   --   --  13.5 14.1  HCT 41.1  --   --   --   --   --  38.7* 40.7  PLT 174  --   --   --   --   --  185 201  APTT 48*  --   --   --   --   --   --   --   LABPROT 21.3*  --  21.9*  --   --   --  30.5* 34.5*  INR 1.93*  --  2.00*  --   --   --  3.13* 3.70*  CREATININE 2.02*  --   --   --   --   --  2.53* 2.18*  TROPONINI  --   < >  --  <0.30 0.33* 0.35*  --   --   < > = values in this interval not displayed.  Estimated Creatinine Clearance: 43.7 ml/min (by C-G formula based on Cr of 2.18).  Assessment:  Today the INR has increased to 3.70, supratherapeutic in this 62 yo male who contines on coumadin for h/o afib. He was admitted 05/17/2012 with new-onset atrial flutter s/p ablation DC home 3/21 then re admitted 05/21/2012  With SOB and CP.  Pharmacy consulted to continue warfarin.   Coag: Afib, INR 3.7 from 3.13 yesterday  after warfarin initiation on 3/20, 7.5 mg x 2 doses (1 at home) then 5 mg on 3/22 and lower 1mg  dose given yesterday 05/22/12.  No bleeding noted. Platelets 207K,  H/H stable.   Goal of Therapy:  INR 2-3 Monitor platelets by anticoagulation protocol: Yes   Plan:  No Coumadin today. -Daily INR   Thank you for allowing pharmacy to be a part of this patients care team.   Noah Delaine, RPh Clinical Pharmacist Pager: [phone] 05/23/2012 5:13PM

## 2012-05-23 NOTE — Progress Notes (Signed)
Pt stated feeling more tired this afternoon then in the morning. BP sys in 90s setting and sanding. PT has been trending in 90s sys. Bp meds on hold Per MD notes. CBG 187, HR 60s-70s, O2 stat 97-100%. PT denies pain and dizziness. Will continue to monitor  (1451 note)     Pt still saying he is feeling weak as above note:denis dizz, nausea and CP. Orothst done no real change in BP from sitting to standing. BP sys90s stat 97% HR 60s-70s. Family was very concern. Told family it was likely do to his BP sys in the 90s and I would page MD. Page PA felt was d/t BPs and stated since PT asym  continue to monitor at this time. Well inform Night RN

## 2012-05-23 NOTE — Progress Notes (Signed)
Pt stated feeling more tired this afternoon then in the morning. BP sys in 90s setting and sanding. PT has been trending in 90s sys. Bp meds on hold Per MD notes. CBG 187, HR 60s-70s, O2 stat 97-100%.  PT denies pain and dizziness. Will continue to monitor

## 2012-05-24 LAB — BASIC METABOLIC PANEL WITH GFR
BUN: 38 mg/dL — ABNORMAL HIGH (ref 6–23)
CO2: 23 meq/L (ref 19–32)
Calcium: 8.9 mg/dL (ref 8.4–10.5)
Chloride: 100 meq/L (ref 96–112)
Creatinine, Ser: 1.66 mg/dL — ABNORMAL HIGH (ref 0.50–1.35)
GFR calc Af Amer: 49 mL/min — ABNORMAL LOW
GFR calc non Af Amer: 43 mL/min — ABNORMAL LOW
Glucose, Bld: 220 mg/dL — ABNORMAL HIGH (ref 70–99)
Potassium: 4.4 meq/L (ref 3.5–5.1)
Sodium: 134 meq/L — ABNORMAL LOW (ref 135–145)

## 2012-05-24 LAB — GLUCOSE, CAPILLARY
Glucose-Capillary: 200 mg/dL — ABNORMAL HIGH (ref 70–99)
Glucose-Capillary: 193 mg/dL — ABNORMAL HIGH (ref 70–99)

## 2012-05-24 LAB — PROTIME-INR: Prothrombin Time: 28.1 seconds — ABNORMAL HIGH (ref 11.6–15.2)

## 2012-05-24 MED ORDER — WARFARIN SODIUM 5 MG PO TABS
5.0000 mg | ORAL_TABLET | Freq: Once | ORAL | Status: AC
  Administered 2012-05-24: 5 mg via ORAL
  Filled 2012-05-24: qty 1

## 2012-05-24 NOTE — Progress Notes (Signed)
Patient: [name] [name] / Admit Date: 05/21/2012 / Date of Encounter: 05/24/2012, 6:37 AM   Subjective  Denies CP, dyspnea. Lower extremity edema unchanged.   Objective   Telemetry: NSR occ PVCs PACs Physical Exam: Filed Vitals:   05/24/12 0459  BP: 117/81  Pulse: 70  Temp: 97.7 F (36.5 C)  Resp:    General: Well developed, well nourished [name] in no acute distress. Head: Normocephalic, atraumatic Neck: JVD not elevated. Lungs: CTA Heart: RRR S1 S2 without murmurs, rubs, or gallops.  Abdomen: Soft, non-tender, non-distended Extremities: 1+ bilat LE edema.   Neuro: Alert and oriented X 3. Moves all extremities spontaneously.    Intake/Output Summary (Last 24 hours) at 05/24/12 0637 Last data filed at 05/24/12 0456  Gross per 24 hour  Intake 1543.5 ml  Output   1475 ml  Net   68.5 ml    Inpatient Medications:  . insulin aspart  0-15 Units Subcutaneous TID WC  . insulin aspart  0-5 Units Subcutaneous QHS  . multivitamin with minerals  1 tablet Oral Daily  . pantoprazole  40 mg Oral Q0600  . simvastatin  40 mg Oral q1800  . sodium chloride  3 mL Intravenous Q12H  . Warfarin - Pharmacist Dosing Inpatient   Does not apply q1800    Labs:  Recent Labs  05/22/12 0455 05/23/12 0450  NA 129* 134*  K 4.0 5.1  CL 94* 99  CO2 19 22  GLUCOSE 181* 202*  BUN 44* 45*  CREATININE 2.53* 2.18*  CALCIUM 9.2 9.5  MG  --  2.2    Recent Labs  05/21/12 0700  AST 39*  ALT 23  ALKPHOS 102  BILITOT 1.3*  PROT 7.3  ALBUMIN 3.2*    Recent Labs  05/21/12 0700 05/22/12 0455 05/23/12 0450  WBC 11.0* 9.4 6.1  NEUTROABS 7.1  --   --   HGB 14.3 13.5 14.1  HCT 41.1 38.7* 40.7  MCV 80.7 78.3 79.6  PLT 174 185 201    Recent Labs  05/21/12 0716 05/21/12 1100 05/21/12 1625 05/21/12 2203  TROPONINI 0.33* <0.30 0.33* 0.35*    Radiology/Studies:  Dg Chest 2 View  05/17/2012  *RADIOLOGY REPORT*  Clinical Data: Shortness of breath  CHEST - 2 VIEW  Comparison:  04/25/2010  Findings: Chronic interstitial markings.  Superimposed increased interstitial markings/bronchitic changes.  Vague right upper lobe opacity is possible but considered less likely.  Left basilar atelectasis.  No pleural effusion or pneumothorax.  Cardiomegaly.  Left subclavian ICD.  Degenerative changes of the visualized thoracolumbar spine.  IMPRESSION: Increased interstitial markings/bronchitic changes.  Vague right upper lobe opacity/pneumonia is possible but considered less likely.   Original Report Authenticated By: Charline Bills, M.D.    Dg Lumbar Spine 2-3 Views  05/17/2012  *RADIOLOGY REPORT*  Clinical Data: Fall, low back pain  LUMBAR SPINE - 2-3 VIEW  Comparison: None.  Findings: Five lumbar-type vertebral bodies.  Normal lumbar lordosis.  No evidence of fracture or dislocation.  Vertebral body heights are maintained.  Mild to moderate degenerative changes.  Visualized bony pelvis is intact.  IMPRESSION: No fracture or dislocation is seen.  Mild to moderate degenerative changes.   Original Report Authenticated By: Charline Bills, M.D.    Ct Abdomen Pelvis W Contrast  05/18/2012  *RADIOLOGY REPORT*  Clinical Data: 62 year old male with abdominal, back and pelvic pain following fall.  The patient is anticoagulated.  CT ABDOMEN AND PELVIS WITH CONTRAST  Technique:  Multidetector CT imaging of the abdomen  and pelvis was performed following the standard protocol during bolus administration of intravenous contrast.  Contrast: 80mL OMNIPAQUE IOHEXOL 300 MG/ML  SOLN  Comparison: None  Findings: Mild cardiomegaly and pacemaker lead identified. Small bilateral pleural effusions and bibasilar atelectasis identified.  The liver, spleen, pancreas, adrenal glands and gallbladder are unremarkable.  Scattered areas of wedge shaped renal scarring are noted bilaterally. A wedge shaped area of decreased attenuation within the posterior lateral mid - lower left kidney is identified but does not exhibit  parenchymal volume loss as the other areas of scarring.  This may represent an area of ischemia or acute - subacute infarct or less likely pyelonephritis.  The bowel, appendix and bladder are unremarkable. Prostate enlargement is present.  There is no evidence of enlarged lymph nodes, biliary dilatation or abdominal aortic aneurysm.  There is no evidence of intra-abdominal or intrapelvic hematoma or hemorrhage.  There is minimal wedging of the L1 vertebral body and does not have an acute appearance. Moderate degenerative changes at L4-L5 is present.  IMPRESSION: No evidence of intra-abdominal or intrapelvic hemorrhage/hematoma.  Small bilateral pleural effusions with mild bibasilar atelectasis.  Scattered areas of renal scarring bilaterally with one wedge-shaped area in the mid - lower left kidney without significant parenchymal loss - infarct or pyelonephritis is not excluded although this finding is likely subacute to chronic.  Mild L1 vertebral body wedging - does not appear acute but correlate with area of pain.   Original Report Authenticated By: Harmon Pier, M.D.    Dg Chest Portable 1 View  05/21/2012  *RADIOLOGY REPORT*  Clinical Data: Shortness of breath  PORTABLE CHEST - 1 VIEW  Comparison: 05/17/2012  Findings: Poor inspiratory effect.  Mild cardiac enlargement stable.  Single lead pacer with a pulse generator over left thorax, stable.  Mild vascular congestion.  IMPRESSION: Mild vascular congestion.  No evidence of focal consolidation or pulmonary edema.   Original Report Authenticated By: Esperanza Heir, M.D.      Assessment and Plan  1. Acute kidney injury - felt due to combination of IV contrast, LV dysfunction, borderline hypotension - wedge-shaped infarcts on CT last admission but renal doesn't think these are relevant to AKI. BP meds on hold, on IV dopamine; continue to hold diuretics although will most likely need low dose at DC. BMET pending. 2. Acute on chronic systolic CHF - diuretics  and antihypertensives on hold due to AKI & hypotension. Wean dopamine as renal function improves and then add low dose cardiac meds if BP allows. 3. Mod-severe MR by echo 04/2012  4. Mildly elevated troponin with history of CAD - troponin felt most likely from recent ablation. Plan outpatient Myoview. Not on ASA due to being on Coumadin. BB on hold from hypotension. 5. Atrial flutter s/p RFA ablation this month - remains in NSR. Coumadin per pharmacy. 6. [name]/ICD - sotalol on hold given worsening renal failure. Will resume as renal function improves. 7. Diabetes mellitus - SSI for now (home insulin had been held).  Signed, Olga Millers MD 6:37 AM

## 2012-05-24 NOTE — Progress Notes (Signed)
ANTICOAGULATION CONSULT NOTE - Follow Up Consult  Pharmacy Consult for Warfarin Indication: atrial fibrillation  Allergies  Allergen Reactions  . Penicillins Other (See Comments)    Unknown reaction    Patient Measurements: Height: 5\' 11"  (180.3 cm) Weight: 234 lb 12.8 oz (106.505 kg) IBW/kg (Calculated) : 75.3  Vital Signs: Temp: 97.6 F (36.4 C) (03/25 1353) Temp src: Oral (03/25 1353) BP: 105/63 mmHg (03/25 1353) Pulse Rate: 76 (03/25 1353)  Labs:  Recent Labs  05/21/12 1625 05/21/12 2203 05/22/12 0455 05/23/12 0450 05/24/12 0550  HGB  --   --  13.5 14.1  --   HCT  --   --  38.7* 40.7  --   PLT  --   --  185 201  --   LABPROT  --   --  30.5* 34.5* 28.1*  INR  --   --  3.13* 3.70* 2.80*  CREATININE  --   --  2.53* 2.18* 1.66*  TROPONINI 0.33* 0.35*  --   --   --     Estimated Creatinine Clearance: 57.3 ml/min (by C-G formula based on Cr of 1.66).  Assessment:  Today the INR returned to therapeutic range, INR = 2.8 this AM. Coumadin dose held yesterday due to INR  >3. This is a 62 yo male on coumadin PTA recently started 3/20 (previous admit)  for  afib whose INR was 1.92 on this admission 05/21/12 after 7.5mg  on 3/20 and 7.5mg  on 3/21.  He was previously admitted 05/17/2012 with new-onset atrial flutter s/p ablation and DC'd  home 3/21 then re admitted 05/21/2012  with SOB and CP/ Acute on CKD in the setting of decompensated heart failure with A flutter . Today the nephrololgist noted AKI improving with improved renal perfusion. Remains in NSR.   No bleeding noted. Platelets 201K,  H/H stable as of 05/23/12.  PTA coumadin dose was 7.5mg  daily and was also on Lovenox prior to admission =100 mg SQ q12h until INR therapeutic. No longer receiving lovenox.  INR is therapeutic.  Goal of Therapy:  INR 2-3 Monitor platelets by anticoagulation protocol: Yes   Plan:  Coumadin 5 mg po today. -Daily INR   Thank you for allowing pharmacy to be a part of this patients care  team.   Noah Delaine, RPh Clinical Pharmacist Pager: [phone] 05/24/2012 2:47 PM

## 2012-05-24 NOTE — Plan of Care (Signed)
Problem: Phase I Progression Outcomes Goal: EF % per last Echo/documented,Core Reminder form on chart Outcome: Completed/Met Date Met:  05/24/12 EF 25-30% from 05/19/2012.

## 2012-05-24 NOTE — Progress Notes (Signed)
Subjective:  No new complaints.  Good UOP with dopamine but I's and O's turned out to be even given large amounts of ins Objective Vital signs in last 24 hours: Filed Vitals:   05/23/12 1655 05/23/12 1824 05/23/12 2104 05/24/12 0459  BP: 92/56 90/60 91/66  117/81  Pulse: 71 71 80 70  Temp:   97.3 F (36.3 C) 97.7 F (36.5 C)  TempSrc:   Oral Oral  Resp:   18   Height:      Weight:    106.505 kg (234 lb 12.8 oz)  SpO2:   99%    Weight change: -0.195 kg (-6.9 oz)  Intake/Output Summary (Last 24 hours) at 05/24/12 0949 Last data filed at 05/24/12 0600  Gross per 24 hour  Intake 1760.6 ml  Output   1475 ml  Net  285.6 ml   Labs: Basic Metabolic Panel:  Recent Labs Lab 05/22/12 0455 05/23/12 0450 05/24/12 0550  NA 129* 134* 134*  K 4.0 5.1 4.4  CL 94* 99 100  CO2 19 22 23   GLUCOSE 181* 202* 220*  BUN 44* 45* 38*  CREATININE 2.53* 2.18* 1.66*  CALCIUM 9.2 9.5 8.9   Liver Function Tests:  Recent Labs Lab 05/18/12 0536 05/21/12 0700  AST 23 39*  ALT 16 23  ALKPHOS 68 102  BILITOT 1.0 1.3*  PROT 7.0 7.3  ALBUMIN 3.1* 3.2*   No results found for this basename: LIPASE, AMYLASE,  in the last 168 hours No results found for this basename: AMMONIA,  in the last 168 hours CBC:  Recent Labs Lab 05/19/12 0600 05/20/12 0610 05/21/12 0700 05/22/12 0455 05/23/12 0450  WBC 5.7 6.8 11.0* 9.4 6.1  NEUTROABS  --   --  7.1  --   --   HGB 13.5 12.8* 14.3 13.5 14.1  HCT 39.9 38.5* 41.1 38.7* 40.7  MCV 79.3 81.4 80.7 78.3 79.6  PLT 171 165 174 185 201   Cardiac Enzymes:  Recent Labs Lab 05/18/12 0536 05/21/12 0716 05/21/12 1100 05/21/12 1625 05/21/12 2203  TROPONINI <0.30 0.33* <0.30 0.33* 0.35*   CBG:  Recent Labs Lab 05/23/12 1104 05/23/12 1504 05/23/12 1623 05/23/12 2101 05/24/12 0548  GLUCAP 270* 187* 183* 190* 200*    Iron Studies: No results found for this basename: IRON, TIBC, TRANSFERRIN, FERRITIN,  in the last 72 hours Studies/Results: US  Renal  05/23/2012  *RADIOLOGY REPORT*  Clinical Data: Acute kidney infection or insult.  RENAL/URINARY TRACT ULTRASOUND COMPLETE  Comparison:  CT abdomen 05/18/2012  Findings:  Right Kidney:  Right kidney measures up to 13.4 cm in length. There is normal echogenicity of the right kidney.  There is a round hypoechoic structure in the upper pole that measures 1.2 cm and suggestive for a cyst. Negative for hydronephrosis.  Left Kidney:  Left kidney measures 13.1 cm in length.  Echogenicity of the left kidney is grossly normal. The lower pole is difficult to visualize.  Negative for hydronephrosis.  Bladder:  Normal appearance of the urinary bladder.  IMPRESSION: No acute abnormalities.  Right renal cyst.   Original Report Authenticated By: Richarda Overlie, M.D.    Medications: Infusions: . DOPamine Stopped (05/24/12 0911)    Scheduled Medications: . insulin aspart  0-15 Units Subcutaneous TID WC  . insulin aspart  0-5 Units Subcutaneous QHS  . multivitamin with minerals  1 tablet Oral Daily  . pantoprazole  40 mg Oral Q0600  . simvastatin  40 mg Oral q1800  . sodium chloride  3 mL  Intravenous Q12H  . Warfarin - Pharmacist Dosing Inpatient   Does not apply q1800    have reviewed scheduled and prn medications.  Physical Exam: General: alert, NAD Heart: irreg Lungs: decreased BS at bases Abdomen: soft, nontender Extremities: pitting edema- unchanged  I Assessment/ Plan: Pt is a 62 y.o. yo male who was admitted on 05/21/2012 with  A on CKD in the setting of decompensated heart failure with A flutter  Assessment/Plan: 1. AKI- secondary to contrast and hemodynamic changes. seems to be improving with improved renal perfusion.  I will leave the plan to cardiology.  I am hesitant to add lasix back in right now given recent hemodynamic instability and I also believe that he will continue to autodiurese, but would probably add that back first, then other CM meds as the BP allows.   2. HTN/vol-  autodiuresing but slowly, plan per cards, may need lasix added back in soon  3. Anemia- not an issue  Renal will sign off as pt is out of woods for now.  I have not arranged OP renal follow up because I anticipate continued renal recovery.  Please call if we can be of further assist    [name] [name] A   05/24/2012,9:49 AM  LOS: 3 days

## 2012-05-24 NOTE — Progress Notes (Addendum)
Pt had 6 beat run of VT 1st this admit  . PT asym. NP paged.   Will continue to monitor.

## 2012-05-25 LAB — GLUCOSE, CAPILLARY
Glucose-Capillary: 219 mg/dL — ABNORMAL HIGH (ref 70–99)
Glucose-Capillary: 247 mg/dL — ABNORMAL HIGH (ref 70–99)

## 2012-05-25 LAB — BASIC METABOLIC PANEL
GFR calc non Af Amer: 50 mL/min — ABNORMAL LOW (ref >90)
Glucose, Bld: 237 mg/dL — ABNORMAL HIGH (ref 70–99)
Potassium: 4.3 mEq/L (ref 3.5–5.1)
Sodium: 133 mEq/L — ABNORMAL LOW (ref 135–145)

## 2012-05-25 LAB — PROTIME-INR: INR: 2.86 — ABNORMAL HIGH (ref 0.00–1.49)

## 2012-05-25 MED ORDER — SOTALOL HCL 120 MG PO TABS
120.0000 mg | ORAL_TABLET | Freq: Two times a day (BID) | ORAL | Status: DC
  Administered 2012-05-25 – 2012-05-31 (×13): 120 mg via ORAL
  Filled 2012-05-25 (×14): qty 1

## 2012-05-25 MED ORDER — WARFARIN SODIUM 2.5 MG PO TABS
2.5000 mg | ORAL_TABLET | ORAL | Status: DC
  Administered 2012-05-25 – 2012-05-30 (×3): 2.5 mg via ORAL
  Filled 2012-05-25 (×3): qty 1

## 2012-05-25 MED ORDER — WARFARIN SODIUM 5 MG PO TABS
5.0000 mg | ORAL_TABLET | ORAL | Status: DC
  Administered 2012-05-26 – 2012-05-29 (×3): 5 mg via ORAL
  Filled 2012-05-25 (×4): qty 1

## 2012-05-25 NOTE — Progress Notes (Signed)
Patient: [name] [name] / Admit Date: 05/21/2012 / Date of Encounter: 05/25/2012, 7:31 AM   Subjective  Denies CP, dyspnea. Lower extremity edema improving.   Objective   Telemetry: NSR occ PVCs PACs Physical Exam: Filed Vitals:   05/25/12 0530  BP: 109/87  Pulse: 78  Temp: 98.1 F (36.7 C)  Resp: 18   General: Well developed, well nourished [name] in no acute distress. Head: Normocephalic, atraumatic Neck: JVD not elevated. Lungs: CTA Heart: RRR S1 S2 without murmurs, rubs, or gallops.  Abdomen: Soft, non-tender, non-distended Extremities: 1+ bilat LE edema.   Neuro: Alert and oriented X 3. Moves all extremities spontaneously.    Intake/Output Summary (Last 24 hours) at 05/25/12 0731 Last data filed at 05/25/12 0600  Gross per 24 hour  Intake 1321.47 ml  Output   1925 ml  Net -603.53 ml    Inpatient Medications:  . insulin aspart  0-15 Units Subcutaneous TID WC  . insulin aspart  0-5 Units Subcutaneous QHS  . multivitamin with minerals  1 tablet Oral Daily  . pantoprazole  40 mg Oral Q0600  . simvastatin  40 mg Oral q1800  . sodium chloride  3 mL Intravenous Q12H  . Warfarin - Pharmacist Dosing Inpatient   Does not apply q1800    Labs:  Recent Labs  05/23/12 0450 05/24/12 0550 05/25/12 0525  NA 134* 134* 133*  K 5.1 4.4 4.3  CL 99 100 99  CO2 22 23 23   GLUCOSE 202* 220* 237*  BUN 45* 38* 28*  CREATININE 2.18* 1.66* 1.45*  CALCIUM 9.5 8.9 9.1  MG 2.2  --   --    No results found for this basename: AST, ALT, ALKPHOS, BILITOT, PROT, ALBUMIN,  in the last 72 hours  Recent Labs  05/23/12 0450  WBC 6.1  HGB 14.1  HCT 40.7  MCV 79.6  PLT 201   No results found for this basename: CKTOTAL, CKMB, TROPONINI,  in the last 72 hours  Radiology/Studies:  Dg Chest 2 View  05/17/2012  *RADIOLOGY REPORT*  Clinical Data: Shortness of breath  CHEST - 2 VIEW  Comparison: 04/25/2010  Findings: Chronic interstitial markings.  Superimposed increased interstitial  markings/bronchitic changes.  Vague right upper lobe opacity is possible but considered less likely.  Left basilar atelectasis.  No pleural effusion or pneumothorax.  Cardiomegaly.  Left subclavian ICD.  Degenerative changes of the visualized thoracolumbar spine.  IMPRESSION: Increased interstitial markings/bronchitic changes.  Vague right upper lobe opacity/pneumonia is possible but considered less likely.   Original Report Authenticated By: Charline Bills, M.D.    Dg Lumbar Spine 2-3 Views  05/17/2012  *RADIOLOGY REPORT*  Clinical Data: Fall, low back pain  LUMBAR SPINE - 2-3 VIEW  Comparison: None.  Findings: Five lumbar-type vertebral bodies.  Normal lumbar lordosis.  No evidence of fracture or dislocation.  Vertebral body heights are maintained.  Mild to moderate degenerative changes.  Visualized bony pelvis is intact.  IMPRESSION: No fracture or dislocation is seen.  Mild to moderate degenerative changes.   Original Report Authenticated By: Charline Bills, M.D.    Ct Abdomen Pelvis W Contrast  05/18/2012  *RADIOLOGY REPORT*  Clinical Data: 62 year old male with abdominal, back and pelvic pain following fall.  The patient is anticoagulated.  CT ABDOMEN AND PELVIS WITH CONTRAST  Technique:  Multidetector CT imaging of the abdomen and pelvis was performed following the standard protocol during bolus administration of intravenous contrast.  Contrast: 80mL OMNIPAQUE IOHEXOL 300 MG/ML  SOLN  Comparison:  None  Findings: Mild cardiomegaly and pacemaker lead identified. Small bilateral pleural effusions and bibasilar atelectasis identified.  The liver, spleen, pancreas, adrenal glands and gallbladder are unremarkable.  Scattered areas of wedge shaped renal scarring are noted bilaterally. A wedge shaped area of decreased attenuation within the posterior lateral mid - lower left kidney is identified but does not exhibit parenchymal volume loss as the other areas of scarring.  This may represent an area of  ischemia or acute - subacute infarct or less likely pyelonephritis.  The bowel, appendix and bladder are unremarkable. Prostate enlargement is present.  There is no evidence of enlarged lymph nodes, biliary dilatation or abdominal aortic aneurysm.  There is no evidence of intra-abdominal or intrapelvic hematoma or hemorrhage.  There is minimal wedging of the L1 vertebral body and does not have an acute appearance. Moderate degenerative changes at L4-L5 is present.  IMPRESSION: No evidence of intra-abdominal or intrapelvic hemorrhage/hematoma.  Small bilateral pleural effusions with mild bibasilar atelectasis.  Scattered areas of renal scarring bilaterally with one wedge-shaped area in the mid - lower left kidney without significant parenchymal loss - infarct or pyelonephritis is not excluded although this finding is likely subacute to chronic.  Mild L1 vertebral body wedging - does not appear acute but correlate with area of pain.   Original Report Authenticated By: Harmon Pier, M.D.    Dg Chest Portable 1 View  05/21/2012  *RADIOLOGY REPORT*  Clinical Data: Shortness of breath  PORTABLE CHEST - 1 VIEW  Comparison: 05/17/2012  Findings: Poor inspiratory effect.  Mild cardiac enlargement stable.  Single lead pacer with a pulse generator over left thorax, stable.  Mild vascular congestion.  IMPRESSION: Mild vascular congestion.  No evidence of focal consolidation or pulmonary edema.   Original Report Authenticated By: Esperanza Heir, M.D.      Assessment and Plan  1. Acute kidney injury - felt due to combination of IV contrast, LV dysfunction, borderline hypotension - wedge-shaped infarcts on CT last admission but renal doesn't think these are relevant to AKI. BP meds on hold, on IV dopamine; continue to hold diuretics although will most likely need low dose at DC. BMET pending. 2. Acute on chronic systolic CHF - diuretics and antihypertensives on hold due to AKI & hypotension. Wean dopamine tomorrow if renal  function continues to improve and then add low dose cardiac meds if BP allows. 3. Mod-severe MR by echo 04/2012  4. Mildly elevated troponin with history of CAD - troponin felt most likely from recent ablation. Plan outpatient Myoview. Not on ASA due to being on Coumadin. BB on hold from hypotension. 5. Atrial flutter s/p RFA ablation this month - remains in NSR. Coumadin per pharmacy. 6. [name]/ICD - Resume sotalol as renal function continues to improve.  7. Diabetes mellitus - SSI for now (home insulin had been held).  Signed, Olga Millers MD 7:31 AM

## 2012-05-25 NOTE — Progress Notes (Signed)
Inpatient Diabetes Program Recommendations  AACE/ADA: New Consensus Statement on Inpatient Glycemic Control (2013)  Target Ranges:  Prepandial:   less than 140 mg/dL      Peak postprandial:   less than 180 mg/dL (1-2 hours)      Critically ill patients:  140 - 180 mg/dL   Reason for Visit: Hyperglycemia  Results for [name] [name], [name] [name] (MRN [id number]) as of 05/25/2012 11:20  Ref. Range 05/24/2012 05:48 05/24/2012 11:13 05/24/2012 16:18 05/24/2012 20:44 05/25/2012 05:48  Glucose-Capillary Latest Range: 70-99 mg/dL 409 (H) 811 (H) 914 (H) 193 (H) 219 (H)    Inpatient Diabetes Program Recommendations Insulin - Basal: Needs basal insulin.  If pt remains NPO, please add Lantus 15 units QHS.  If po's begin, add 70/30 15 units bid Outpatient Referral: Outpatient Diabetes Education for HgbA1C of 11.5% Diet: When advanced, CHO mod medium  Note: Will continue to follow.  Thank you. Ailene Ards, RD, LDN, CDE Inpatient Diabetes Coordinator [phone]

## 2012-05-25 NOTE — Progress Notes (Signed)
ANTICOAGULATION CONSULT NOTE - Follow Up Consult  Pharmacy Consult for Warfarin Indication: atrial fibrillation  Allergies  Allergen Reactions  . Penicillins Other (See Comments)    Unknown reaction    Patient Measurements: Height: 5\' 11"  (180.3 cm) Weight: 234 lb 3.2 oz (106.232 kg) (SCALE b) IBW/kg (Calculated) : 75.3  Vital Signs: Temp: 98.1 F (36.7 C) (03/26 0530) Temp src: Oral (03/26 0530) BP: 108/82 mmHg (03/26 1031) Pulse Rate: 80 (03/26 1031)  Labs:  Recent Labs  05/23/12 0450 05/24/12 0550 05/25/12 0525  HGB 14.1  --   --   HCT 40.7  --   --   PLT 201  --   --   LABPROT 34.5* 28.1* 28.5*  INR 3.70* 2.80* 2.86*  CREATININE 2.18* 1.66* 1.45*    Estimated Creatinine Clearance: 65.5 ml/min (by C-G formula based on Cr of 1.45).  Assessment:   He was previously admitted 05/17/2012 with new-onset atrial flutter s/p ablation and DC'd  home 3/21 then re admitted 05/21/2012  with SOB and CP/ Acute on CKD in the setting of decompensated heart failure with A flutter . Today the nephrololgist noted AKI improving with improved renal perfusion. Remains in NSR.   Today the INR 2.86 is in therapeutic range s/p Coumadin 5mg  3/25 .  This is a 62 yo male on coumadin PTA recently started 3/20 (previous admit)  for  afib whose INR was 1.92 on this admission 05/21/12 after 7.5mg  on 3/20 and 7.5mg  on 3/21. No bleeding noted. Platelets 201K,  H/H stable as of 05/23/12.   Goal of Therapy:  INR 2-3 Monitor platelets by anticoagulation protocol: Yes   Plan:  Estimate he will need a combination of Couadmin 5mg  and 2.5mg  Will start with Coumadin 2.5mg  MWF and 5mg  TTSS   Leota Sauers Pharm.D. CPP, BCPS Clinical Pharmacist [phone] 05/25/2012 11:56 AM

## 2012-05-26 LAB — GLUCOSE, CAPILLARY: Glucose-Capillary: 232 mg/dL — ABNORMAL HIGH (ref 70–99)

## 2012-05-26 LAB — BASIC METABOLIC PANEL
BUN: 22 mg/dL (ref 6–23)
Calcium: 8.8 mg/dL (ref 8.4–10.5)
Creatinine, Ser: 1.36 mg/dL — ABNORMAL HIGH (ref 0.50–1.35)
GFR calc Af Amer: 63 mL/min — ABNORMAL LOW (ref >90)
GFR calc non Af Amer: 54 mL/min — ABNORMAL LOW (ref >90)
Potassium: 3.9 mEq/L (ref 3.5–5.1)

## 2012-05-26 LAB — PROTIME-INR: Prothrombin Time: 28 seconds — ABNORMAL HIGH (ref 11.6–15.2)

## 2012-05-26 MED ORDER — FUROSEMIDE 10 MG/ML IJ SOLN
40.0000 mg | Freq: Once | INTRAMUSCULAR | Status: AC
  Administered 2012-05-26: 40 mg via INTRAVENOUS
  Filled 2012-05-26: qty 4

## 2012-05-26 NOTE — Progress Notes (Signed)
ANTICOAGULATION CONSULT NOTE - Follow Up Consult  Pharmacy Consult for Warfarin Indication: atrial fibrillation  Allergies  Allergen Reactions  . Penicillins Other (See Comments)    Unknown reaction    Patient Measurements: Height: 5\' 11"  (180.3 cm) Weight: 233 lb 11 oz (106 kg) IBW/kg (Calculated) : 75.3  Vital Signs: Temp: 97.6 F (36.4 C) (03/27 0540) Temp src: Oral (03/27 0540) BP: 96/61 mmHg (03/27 0540) Pulse Rate: 65 (03/27 0540)  Labs:  Recent Labs  05/24/12 0550 05/25/12 0525 05/26/12 0505  LABPROT 28.1* 28.5* 28.0*  INR 2.80* 2.86* 2.79*  CREATININE 1.66* 1.45* 1.36*    Estimated Creatinine Clearance: 69.8 ml/min (by C-G formula based on Cr of 1.36).  Assessment:  80 yom previously admitted 05/17/2012 with new-onset atrial flutter s/p ablation and DC'd  home 3/21 then re admitted 05/21/2012  with SOB and CP. Pt continues on coumadin for afib and his INR today remains therapeutic. No CBC available today, no bleeding noted.    Goal of Therapy:  INR 2-3  Plan:  1. Continue coumadin 2.5mg  MWF + 5mg  TThSaSu for now 2. F/u AM INR  Lysle Pearl, PharmD, BCPS Pager # [phone] 05/26/2012 9:02 AM

## 2012-05-26 NOTE — Progress Notes (Signed)
Patient: [name] [name] / Admit Date: 05/21/2012 / Date of Encounter: 05/26/2012, 7:55 AM   Subjective  Denies CP, dyspnea. Lower extremity edema unchanged,   Objective   Telemetry: NSR occ PVCs PACs Physical Exam: Filed Vitals:   05/26/12 0540  BP: 96/61  Pulse: 65  Temp: 97.6 F (36.4 C)  Resp: 18   General: Well developed, well nourished [name] in no acute distress. Head: Normocephalic, atraumatic Neck: JVD not elevated. Lungs: CTA Heart: RRR S1 S2 without murmurs, rubs, or gallops.  Abdomen: Soft, non-tender, non-distended Extremities: 1+ bilat LE edema.   Neuro: Alert and oriented X 3. Moves all extremities spontaneously.    Intake/Output Summary (Last 24 hours) at 05/26/12 0755 Last data filed at 05/26/12 0700  Gross per 24 hour  Intake 505.51 ml  Output   2925 ml  Net -2419.49 ml    Inpatient Medications:  . insulin aspart  0-15 Units Subcutaneous TID WC  . insulin aspart  0-5 Units Subcutaneous QHS  . multivitamin with minerals  1 tablet Oral Daily  . pantoprazole  40 mg Oral Q0600  . simvastatin  40 mg Oral q1800  . sodium chloride  3 mL Intravenous Q12H  . sotalol  120 mg Oral Q12H  . warfarin  2.5 mg Oral Q M,W,F-1800  . warfarin  5 mg Oral Q T,Th,S,Su-1800  . Warfarin - Pharmacist Dosing Inpatient   Does not apply q1800    Labs:  Recent Labs  05/25/12 0525 05/26/12 0505  NA 133* 134*  K 4.3 3.9  CL 99 101  CO2 23 25  GLUCOSE 237* 217*  BUN 28* 22  CREATININE 1.45* 1.36*  CALCIUM 9.1 8.8   No results found for this basename: AST, ALT, ALKPHOS, BILITOT, PROT, ALBUMIN,  in the last 72 hours No results found for this basename: WBC, NEUTROABS, HGB, HCT, MCV, PLT,  in the last 72 hours No results found for this basename: CKTOTAL, CKMB, TROPONINI,  in the last 72 hours  Radiology/Studies:  Dg Chest 2 View  05/17/2012  *RADIOLOGY REPORT*  Clinical Data: Shortness of breath  CHEST - 2 VIEW  Comparison: 04/25/2010  Findings: Chronic interstitial  markings.  Superimposed increased interstitial markings/bronchitic changes.  Vague right upper lobe opacity is possible but considered less likely.  Left basilar atelectasis.  No pleural effusion or pneumothorax.  Cardiomegaly.  Left subclavian ICD.  Degenerative changes of the visualized thoracolumbar spine.  IMPRESSION: Increased interstitial markings/bronchitic changes.  Vague right upper lobe opacity/pneumonia is possible but considered less likely.   Original Report Authenticated By: Charline Bills, M.D.    Dg Lumbar Spine 2-3 Views  05/17/2012  *RADIOLOGY REPORT*  Clinical Data: Fall, low back pain  LUMBAR SPINE - 2-3 VIEW  Comparison: None.  Findings: Five lumbar-type vertebral bodies.  Normal lumbar lordosis.  No evidence of fracture or dislocation.  Vertebral body heights are maintained.  Mild to moderate degenerative changes.  Visualized bony pelvis is intact.  IMPRESSION: No fracture or dislocation is seen.  Mild to moderate degenerative changes.   Original Report Authenticated By: Charline Bills, M.D.    Ct Abdomen Pelvis W Contrast  05/18/2012  *RADIOLOGY REPORT*  Clinical Data: 62 year old male with abdominal, back and pelvic pain following fall.  The patient is anticoagulated.  CT ABDOMEN AND PELVIS WITH CONTRAST  Technique:  Multidetector CT imaging of the abdomen and pelvis was performed following the standard protocol during bolus administration of intravenous contrast.  Contrast: 80mL OMNIPAQUE IOHEXOL 300 MG/ML  SOLN  Comparison: None  Findings: Mild cardiomegaly and pacemaker lead identified. Small bilateral pleural effusions and bibasilar atelectasis identified.  The liver, spleen, pancreas, adrenal glands and gallbladder are unremarkable.  Scattered areas of wedge shaped renal scarring are noted bilaterally. A wedge shaped area of decreased attenuation within the posterior lateral mid - lower left kidney is identified but does not exhibit parenchymal volume loss as the other areas of  scarring.  This may represent an area of ischemia or acute - subacute infarct or less likely pyelonephritis.  The bowel, appendix and bladder are unremarkable. Prostate enlargement is present.  There is no evidence of enlarged lymph nodes, biliary dilatation or abdominal aortic aneurysm.  There is no evidence of intra-abdominal or intrapelvic hematoma or hemorrhage.  There is minimal wedging of the L1 vertebral body and does not have an acute appearance. Moderate degenerative changes at L4-L5 is present.  IMPRESSION: No evidence of intra-abdominal or intrapelvic hemorrhage/hematoma.  Small bilateral pleural effusions with mild bibasilar atelectasis.  Scattered areas of renal scarring bilaterally with one wedge-shaped area in the mid - lower left kidney without significant parenchymal loss - infarct or pyelonephritis is not excluded although this finding is likely subacute to chronic.  Mild L1 vertebral body wedging - does not appear acute but correlate with area of pain.   Original Report Authenticated By: Harmon Pier, M.D.    Dg Chest Portable 1 View  05/21/2012  *RADIOLOGY REPORT*  Clinical Data: Shortness of breath  PORTABLE CHEST - 1 VIEW  Comparison: 05/17/2012  Findings: Poor inspiratory effect.  Mild cardiac enlargement stable.  Single lead pacer with a pulse generator over left thorax, stable.  Mild vascular congestion.  IMPRESSION: Mild vascular congestion.  No evidence of focal consolidation or pulmonary edema.   Original Report Authenticated By: Esperanza Heir, M.D.      Assessment and Plan  1. Acute kidney injury - felt due to combination of IV contrast, LV dysfunction, borderline hypotension - wedge-shaped infarcts on CT last admission but renal doesn't think these are relevant to AKI. BP meds on hold, on IV dopamine; Renal function continues to improve; remains volume overloaded; will give lasix 40 mg IV x 1 today; follow renal function; will hopefully DC dopamine in AM. 2. Acute on chronic  systolic CHF -  Cardiac meds on hold due to AKI & hypotension. Wean dopamine tomorrow if renal function continues to improve and then add low dose cardiac meds if BP allows. 3. Mod-severe MR by echo 04/2012  4. Mildly elevated troponin with history of CAD - troponin felt most likely from recent ablation. Plan outpatient Myoview. Not on ASA due to being on Coumadin. BB on hold from hypotension. 5. Atrial flutter s/p RFA ablation this month - remains in NSR. Coumadin per pharmacy. 6. [name]/ICD - Sotalol resumed.  7. Diabetes mellitus - SSI for now (home insulin had been held).  Signed, Olga Millers MD 7:55 AM

## 2012-05-26 NOTE — Progress Notes (Signed)
05/26/12 1013 Pt. currently on Dopamine gtt. Pt. would benefit from [facility] RN for Chronic disease management.  Noted pt. does not currently have any insurance.  Pt. would qualify for the [facility] program for medication assistance.  NCM will follow.  Tera Mater, RN, BSN NCM [phone]

## 2012-05-27 LAB — BASIC METABOLIC PANEL
BUN: 22 mg/dL (ref 6–23)
Creatinine, Ser: 1.25 mg/dL (ref 0.50–1.35)
GFR calc Af Amer: 70 mL/min — ABNORMAL LOW (ref >90)
GFR calc non Af Amer: 60 mL/min — ABNORMAL LOW (ref >90)

## 2012-05-27 LAB — GLUCOSE, CAPILLARY
Glucose-Capillary: 193 mg/dL — ABNORMAL HIGH (ref 70–99)
Glucose-Capillary: 256 mg/dL — ABNORMAL HIGH (ref 70–99)

## 2012-05-27 LAB — PROTIME-INR
INR: 2.6 — ABNORMAL HIGH (ref 0.00–1.49)
Prothrombin Time: 26.6 seconds — ABNORMAL HIGH (ref 11.6–15.2)

## 2012-05-27 MED ORDER — FUROSEMIDE 10 MG/ML IJ SOLN
40.0000 mg | Freq: Once | INTRAMUSCULAR | Status: AC
  Administered 2012-05-27: 40 mg via INTRAVENOUS
  Filled 2012-05-27: qty 4

## 2012-05-27 NOTE — Progress Notes (Signed)
ANTICOAGULATION CONSULT NOTE - Follow Up Consult  Pharmacy Consult for Warfarin Indication: atrial fibrillation  Allergies  Allergen Reactions  . Penicillins Other (See Comments)    Unknown reaction    Patient Measurements: Height: 5\' 11"  (180.3 cm) Weight: 229 lb 15 oz (104.3 kg) IBW/kg (Calculated) : 75.3  Vital Signs: Temp: 98.1 F (36.7 C) (03/28 0544) Temp src: Oral (03/28 0544) BP: 117/78 mmHg (03/28 0544) Pulse Rate: 68 (03/28 0544)  Labs:  Recent Labs  05/25/12 0525 05/26/12 0505 05/27/12 0610  LABPROT 28.5* 28.0* 26.6*  INR 2.86* 2.79* 2.60*  CREATININE 1.45* 1.36*  --     Estimated Creatinine Clearance: 69.2 ml/min (by C-G formula based on Cr of 1.36).  Assessment:  7 yom previously admitted 05/17/2012 with new-onset atrial flutter s/p ablation and DC'd  home 3/21 then re admitted 05/21/2012  with SOB and CP. Pt continues on coumadin for afib and his INR today remains therapeutic. No CBC available today, no bleeding noted.    Goal of Therapy:  INR 2-3  Plan:  1. Continue coumadin 2.5mg  MWF + 5mg  TThSaSu for now 2. F/u AM INR  Lysle Pearl, PharmD, BCPS Pager # [phone] 05/27/2012 9:09 AM

## 2012-05-27 NOTE — Progress Notes (Signed)
05/27/12 1210 In to speak with pt. about home health services.  Pt. would like to have [facility].  TC to Lupita Leash, with [facility], to give referral for [facility] RN for heart failure management. Pt. may dc over weekend. Tera Mater, RN, BSN NCM [phone]

## 2012-05-27 NOTE — Plan of Care (Signed)
Problem: Phase I Progression Outcomes Goal: Initial discharge plan identified Outcome: Completed/Met Date Met:  05/27/12 Initial plan is to return home with wife  Problem: Phase II Progression Outcomes Goal: Dyspnea controlled with activity Outcome: Completed/Met Date Met:  05/27/12 Pt oob ad lib, pt has not had any c/o SOB with activity

## 2012-05-27 NOTE — Progress Notes (Signed)
Patient: [name] [name] / Admit Date: 05/21/2012 / Date of Encounter: 05/27/2012, 7:45 AM   Subjective  Denies CP, dyspnea. Lower extremity edema mildly improved   Objective   Telemetry: NSR occ PVCs PACs Physical Exam: Filed Vitals:   05/27/12 0544  BP: 117/78  Pulse: 68  Temp: 98.1 F (36.7 C)  Resp: 18   General: Well developed, well nourished [name] in no acute distress. Head: Normocephalic, atraumatic Neck: JVD not elevated. Lungs: CTA Heart: RRR S1 S2 without murmurs, rubs, or gallops.  Abdomen: Soft, non-tender, non-distended Extremities: 1+ bilat LE edema.   Neuro: Alert and oriented X 3. Moves all extremities spontaneously.    Intake/Output Summary (Last 24 hours) at 05/27/12 0745 Last data filed at 05/27/12 1610  Gross per 24 hour  Intake 1160.4 ml  Output   3695 ml  Net -2534.6 ml    Inpatient Medications:  . insulin aspart  0-15 Units Subcutaneous TID WC  . insulin aspart  0-5 Units Subcutaneous QHS  . multivitamin with minerals  1 tablet Oral Daily  . pantoprazole  40 mg Oral Q0600  . simvastatin  40 mg Oral q1800  . sodium chloride  3 mL Intravenous Q12H  . sotalol  120 mg Oral Q12H  . warfarin  2.5 mg Oral Q M,W,F-1800  . warfarin  5 mg Oral Q T,Th,S,Su-1800  . Warfarin - Pharmacist Dosing Inpatient   Does not apply q1800    Labs:  Recent Labs  05/25/12 0525 05/26/12 0505  NA 133* 134*  K 4.3 3.9  CL 99 101  CO2 23 25  GLUCOSE 237* 217*  BUN 28* 22  CREATININE 1.45* 1.36*  CALCIUM 9.1 8.8    Radiology/Studies:  Dg Chest 2 View  05/17/2012  *RADIOLOGY REPORT*  Clinical Data: Shortness of breath  CHEST - 2 VIEW  Comparison: 04/25/2010  Findings: Chronic interstitial markings.  Superimposed increased interstitial markings/bronchitic changes.  Vague right upper lobe opacity is possible but considered less likely.  Left basilar atelectasis.  No pleural effusion or pneumothorax.  Cardiomegaly.  Left subclavian ICD.  Degenerative changes of  the visualized thoracolumbar spine.  IMPRESSION: Increased interstitial markings/bronchitic changes.  Vague right upper lobe opacity/pneumonia is possible but considered less likely.   Original Report Authenticated By: Charline Bills, M.D.    Dg Lumbar Spine 2-3 Views  05/17/2012  *RADIOLOGY REPORT*  Clinical Data: Fall, low back pain  LUMBAR SPINE - 2-3 VIEW  Comparison: None.  Findings: Five lumbar-type vertebral bodies.  Normal lumbar lordosis.  No evidence of fracture or dislocation.  Vertebral body heights are maintained.  Mild to moderate degenerative changes.  Visualized bony pelvis is intact.  IMPRESSION: No fracture or dislocation is seen.  Mild to moderate degenerative changes.   Original Report Authenticated By: Charline Bills, M.D.    Ct Abdomen Pelvis W Contrast  05/18/2012  *RADIOLOGY REPORT*  Clinical Data: 63 year old male with abdominal, back and pelvic pain following fall.  The patient is anticoagulated.  CT ABDOMEN AND PELVIS WITH CONTRAST  Technique:  Multidetector CT imaging of the abdomen and pelvis was performed following the standard protocol during bolus administration of intravenous contrast.  Contrast: 80mL OMNIPAQUE IOHEXOL 300 MG/ML  SOLN  Comparison: None  Findings: Mild cardiomegaly and pacemaker lead identified. Small bilateral pleural effusions and bibasilar atelectasis identified.  The liver, spleen, pancreas, adrenal glands and gallbladder are unremarkable.  Scattered areas of wedge shaped renal scarring are noted bilaterally. A wedge shaped area of decreased attenuation within the  posterior lateral mid - lower left kidney is identified but does not exhibit parenchymal volume loss as the other areas of scarring.  This may represent an area of ischemia or acute - subacute infarct or less likely pyelonephritis.  The bowel, appendix and bladder are unremarkable. Prostate enlargement is present.  There is no evidence of enlarged lymph nodes, biliary dilatation or abdominal  aortic aneurysm.  There is no evidence of intra-abdominal or intrapelvic hematoma or hemorrhage.  There is minimal wedging of the L1 vertebral body and does not have an acute appearance. Moderate degenerative changes at L4-L5 is present.  IMPRESSION: No evidence of intra-abdominal or intrapelvic hemorrhage/hematoma.  Small bilateral pleural effusions with mild bibasilar atelectasis.  Scattered areas of renal scarring bilaterally with one wedge-shaped area in the mid - lower left kidney without significant parenchymal loss - infarct or pyelonephritis is not excluded although this finding is likely subacute to chronic.  Mild L1 vertebral body wedging - does not appear acute but correlate with area of pain.   Original Report Authenticated By: Harmon Pier, M.D.    Dg Chest Portable 1 View  05/21/2012  *RADIOLOGY REPORT*  Clinical Data: Shortness of breath  PORTABLE CHEST - 1 VIEW  Comparison: 05/17/2012  Findings: Poor inspiratory effect.  Mild cardiac enlargement stable.  Single lead pacer with a pulse generator over left thorax, stable.  Mild vascular congestion.  IMPRESSION: Mild vascular congestion.  No evidence of focal consolidation or pulmonary edema.   Original Report Authenticated By: Esperanza Heir, M.D.      Assessment and Plan  1. Acute kidney injury - felt due to combination of IV contrast, LV dysfunction, borderline hypotension - wedge-shaped infarcts on CT last admission but renal doesn't think these are relevant to AKI. BP meds on hold, on IV dopamine; Renal function continues improving (BMET pending this AM); remains volume overloaded; will give lasix 40 mg IV x 1 today; follow renal function; DC dopamine today and follow BP. 2. Acute on chronic systolic CHF -  Cardiac meds on hold due to AKI & hypotension. Wean dopamine today; if renal function continues to improve and BP allows, add low dose cardiac in AM (would begin with coreg 3.125 mg po BID). 3. Mod-severe MR by echo 04/2012  4. Mildly  elevated troponin with history of CAD - troponin felt most likely from recent ablation. Plan outpatient Myoview. Not on ASA due to being on Coumadin. BB on hold from hypotension. 5. Atrial flutter s/p RFA ablation this month - remains in NSR. Coumadin per pharmacy. After 4 weeks coumadin, will change to ASA. 6. [name]/ICD - Sotalol resumed.  7. Diabetes mellitus - SSI for now (home insulin had been held). If renal function continues to improve and volume status stable, can DC Sunday and fu with me in 2 weeks; will most likely need to be seen in CHF clinic for med titration. Signed, Olga Millers MD 7:45 AM

## 2012-05-27 NOTE — Progress Notes (Signed)
Inpatient Diabetes Program Recommendations  AACE/ADA: New Consensus Statement on Inpatient Glycemic Control (2013)  Target Ranges:  Prepandial:   less than 140 mg/dL      Peak postprandial:   less than 180 mg/dL (1-2 hours)      Critically ill patients:  140 - 180 mg/dL   Reason for Visit: Hyperglycemia Results for [name], [name] (MRN [id number]) as of 05/27/2012 12:07  Ref. Range 05/26/2012 05:05  Sodium Latest Range: 135-145 mEq/L 134 (L)  Potassium Latest Range: 3.5-5.1 mEq/L 3.9  Chloride Latest Range: 96-112 mEq/L 101  CO2 Latest Range: 19-32 mEq/L 25  BUN Latest Range: 6-23 mg/dL 22  Creatinine Latest Range: 0.50-1.35 mg/dL 4.09 (H)  Calcium Latest Range: 8.4-10.5 mg/dL 8.8  GFR calc non Af Amer Latest Range: >90 mL/min 54 (L)  GFR calc Af Amer Latest Range: >90 mL/min 63 (L)  Glucose Latest Range: 70-99 mg/dL 811 (H)  Results for [name], [name] (MRN [id number]) as of 05/27/2012 12:07  Ref. Range 05/26/2012 06:44 05/26/2012 11:05 05/26/2012 16:11 05/26/2012 21:07 05/27/2012 06:02 05/27/2012 11:15  Glucose-Capillary Latest Range: 70-99 mg/dL 213 (H) 086 (H) 578 (H) 222 (H) 193 (H) 256 (H)     Inpatient Diabetes Program Recommendations Insulin - Basal: 70/30 15 units bid Outpatient Referral: Outpatient Diabetes Education for HgbA1C of 11.5% Diet: When advanced, CHO mod medium  Note: Will follow.  Thank you. Ailene Ards, RD, LDN, CDE Inpatient Diabetes Coordinator [phone]

## 2012-05-28 LAB — GLUCOSE, CAPILLARY
Glucose-Capillary: 164 mg/dL — ABNORMAL HIGH (ref 70–99)
Glucose-Capillary: 211 mg/dL — ABNORMAL HIGH (ref 70–99)

## 2012-05-28 LAB — BASIC METABOLIC PANEL
BUN: 24 mg/dL — ABNORMAL HIGH (ref 6–23)
CO2: 26 mEq/L (ref 19–32)
Calcium: 9.4 mg/dL (ref 8.4–10.5)
Creatinine, Ser: 1.32 mg/dL (ref 0.50–1.35)
Glucose, Bld: 140 mg/dL — ABNORMAL HIGH (ref 70–99)

## 2012-05-28 MED ORDER — CARVEDILOL 3.125 MG PO TABS
3.1250 mg | ORAL_TABLET | Freq: Two times a day (BID) | ORAL | Status: DC
  Administered 2012-05-28 – 2012-05-31 (×6): 3.125 mg via ORAL
  Filled 2012-05-28 (×8): qty 1

## 2012-05-28 MED ORDER — FUROSEMIDE 40 MG PO TABS
40.0000 mg | ORAL_TABLET | Freq: Every day | ORAL | Status: DC
  Administered 2012-05-28 – 2012-05-29 (×2): 40 mg via ORAL
  Filled 2012-05-28 (×2): qty 1

## 2012-05-28 NOTE — Plan of Care (Addendum)
Problem: Phase III Progression Outcomes Goal: Pain controlled on oral analgesia Outcome: Completed/Met Date Met:  05/28/12 Pt has not had any c/o c/p  Goal: Activity at appropriate level-compared to baseline Outcome: Completed/Met Date Met:  05/28/12 Pt oob ad lib, goal met Goal: Tolerating diet Outcome: Completed/Met Date Met:  05/28/12 Pt tolerating diet well, no c/o n/v Goal: Dyspnea controlled with activity Outcome: Completed/Met Date Met:  05/28/12 Pt has not had any c/o SOB with activity, pt on room air O2 sats WNL

## 2012-05-28 NOTE — Progress Notes (Signed)
ANTICOAGULATION CONSULT NOTE - Follow Up Consult  Pharmacy Consult for Warfarin Indication: atrial fibrillation  Allergies  Allergen Reactions  . Penicillins Other (See Comments)    Unknown reaction    Patient Measurements: Height: 5\' 11"  (180.3 cm) Weight: 228 lb 9.6 oz (103.692 kg) (scale b) IBW/kg (Calculated) : 75.3  Vital Signs: Temp: 97.9 F (36.6 C) (03/29 0910) Temp src: Oral (03/29 0910) BP: 111/74 mmHg (03/29 0910) Pulse Rate: 66 (03/29 0910)  Labs:  Recent Labs  05/26/12 0505 05/27/12 0610 05/27/12 1549 05/28/12 0500  LABPROT 28.0* 26.6*  --  27.9*  INR 2.79* 2.60*  --  2.77*  CREATININE 1.36*  --  1.25 1.32    Estimated Creatinine Clearance: 71.2 ml/min (by C-G formula based on Cr of 1.32).   Medications:  Scheduled:  . insulin aspart  0-15 Units Subcutaneous TID WC  . insulin aspart  0-5 Units Subcutaneous QHS  . multivitamin with minerals  1 tablet Oral Daily  . pantoprazole  40 mg Oral Q0600  . simvastatin  40 mg Oral q1800  . sodium chloride  3 mL Intravenous Q12H  . sotalol  120 mg Oral Q12H  . warfarin  2.5 mg Oral Q M,W,F-1800  . warfarin  5 mg Oral Q T,Th,S,Su-1800  . Warfarin - Pharmacist Dosing Inpatient   Does not apply q1800    Assessment: 67 yom previously admitted 05/17/2012 with new-onset atrial flutter s/p ablation and DC'd home 3/21 then re admitted 05/21/2012 with SOB and CP. Pt continues on warfarin for afib and his INR today remains therapeutic. No CBC available today, no bleeding noted.   Goal of Therapy:  INR 2-3  Plan:  1. Continue coumadin 2.5mg  MWF + 5mg  TThSaSu for now  2. F/u AM INR  Lillia Pauls, PharmD Clinical Pharmacist Pager: [phone] Phone: [phone] 05/28/2012 9:46 AM

## 2012-05-28 NOTE — Progress Notes (Signed)
Subjective:  The patient is feeling well.  He denies any chest pain or shortness of breath today.  Rhythm is regular in normal sinus rhythm.  His weight is down and his peripheral edema is improving.  Objective:  Vital Signs in the last 24 hours: Temp:  [97.3 F (36.3 C)-97.9 F (36.6 C)] 97.9 F (36.6 C) (03/29 0910) Pulse Rate:  [62-66] 66 (03/29 0910) Resp:  [18-20] 18 (03/29 0449) BP: (97-111)/(64-74) 111/74 mmHg (03/29 0910) SpO2:  [99 %-100 %] 99 % (03/29 0910) Weight:  [228 lb 9.6 oz (103.692 kg)] 228 lb 9.6 oz (103.692 kg) (03/29 0449)  Intake/Output from previous day: 03/28 0701 - 03/29 0700 In: 930 [P.O.:930] Out: 1350 [Urine:1350] Intake/Output from this shift: Total I/O In: 360 [P.O.:360] Out: 200 [Urine:200]  . insulin aspart  0-15 Units Subcutaneous TID WC  . insulin aspart  0-5 Units Subcutaneous QHS  . multivitamin with minerals  1 tablet Oral Daily  . pantoprazole  40 mg Oral Q0600  . simvastatin  40 mg Oral q1800  . sodium chloride  3 mL Intravenous Q12H  . sotalol  120 mg Oral Q12H  . warfarin  2.5 mg Oral Q M,W,F-1800  . warfarin  5 mg Oral Q T,Th,S,Su-1800  . Warfarin - Pharmacist Dosing Inpatient   Does not apply q1800      Physical Exam: The patient appears to be in no distress.  Head and neck exam reveals that the pupils are equal and reactive.  The extraocular movements are full.  There is no scleral icterus.  Mouth and pharynx are benign.  No lymphadenopathy.  No carotid bruits.  The jugular venous pressure is normal.  Thyroid is not enlarged or tender.  Chest is clear to percussion and auscultation.  No rales or rhonchi.  Expansion of the chest is symmetrical.  Heart reveals no abnormal lift or heave.  First and second heart sounds are normal.  There is no murmur gallop rub or click.  The abdomen is soft and nontender.  Bowel sounds are normoactive.  There is no hepatosplenomegaly or mass.  There are no abdominal bruits.  Extremities  reveal mild edema Pedal pulses are good.  There is no cyanosis or clubbing.  Neurologic exam is normal strength and no lateralizing weakness.  No sensory deficits.  Integument reveals no rash  Lab Results: No results found for this basename: WBC, HGB, PLT,  in the last 72 hours  Recent Labs  05/27/12 1549 05/28/12 0500  NA 134* 137  K 4.4 4.8  CL 98 100  CO2 25 26  GLUCOSE 198* 140*  BUN 22 24*  CREATININE 1.25 1.32   No results found for this basename: TROPONINI, CK, MB,  in the last 72 hours Hepatic Function Panel No results found for this basename: PROT, ALBUMIN, AST, ALT, ALKPHOS, BILITOT, BILIDIR, IBILI,  in the last 72 hours No results found for this basename: CHOL,  in the last 72 hours No results found for this basename: PROTIME,  in the last 72 hours  Imaging: No results found.  Cardiac Studies: Telemetry shows normal sinus rhythm at 64 per minute Assessment/Plan:  1. acute on chronic systolic congestive heart failure.  He has been weaned off of dopamine and his blood pressure is stable.  Will start carvedilol 3.125 mg twice a day as noted by Dr. Ludwig Clarks note. 2. acute kidney injury, stable creatinine after 40 mg Lasix IV yesterday.  Will give Lasix 40 mg orally today.  LOS: 7  days    [name] [name] 05/28/2012, 11:36 AM

## 2012-05-29 LAB — BASIC METABOLIC PANEL
BUN: 23 mg/dL (ref 6–23)
GFR calc Af Amer: 71 mL/min — ABNORMAL LOW (ref >90)
GFR calc non Af Amer: 61 mL/min — ABNORMAL LOW (ref >90)
Potassium: 3.9 mEq/L (ref 3.5–5.1)
Sodium: 135 mEq/L (ref 135–145)

## 2012-05-29 LAB — PROTIME-INR: Prothrombin Time: 27.6 seconds — ABNORMAL HIGH (ref 11.6–15.2)

## 2012-05-29 LAB — GLUCOSE, CAPILLARY: Glucose-Capillary: 175 mg/dL — ABNORMAL HIGH (ref 70–99)

## 2012-05-29 MED ORDER — FUROSEMIDE 10 MG/ML IJ SOLN
40.0000 mg | Freq: Every day | INTRAMUSCULAR | Status: DC
  Administered 2012-05-29 – 2012-05-31 (×3): 40 mg via INTRAVENOUS
  Filled 2012-05-29 (×2): qty 4

## 2012-05-29 MED ORDER — FUROSEMIDE 10 MG/ML IJ SOLN: 40.0000 mg | Freq: Two times a day (BID) | INTRAMUSCULAR | Status: DC

## 2012-05-29 NOTE — Progress Notes (Signed)
ANTICOAGULATION CONSULT NOTE - Follow Up Consult  Pharmacy Consult for warfarin Indication: atrial fibrillation  Allergies  Allergen Reactions  . Penicillins Other (See Comments)    Unknown reaction    Patient Measurements: Height: 5\' 11"  (180.3 cm) Weight: 228 lb (103.42 kg) (scale b) IBW/kg (Calculated) : 75.3  Vital Signs: Temp: 98.1 F (36.7 C) (03/30 0536) Temp src: Oral (03/30 0536) BP: 112/71 mmHg (03/30 0536) Pulse Rate: 68 (03/30 0536)  Labs:  Recent Labs  05/27/12 0610 05/27/12 1549 05/28/12 0500 05/29/12 0455  LABPROT 26.6*  --  27.9* 27.6*  INR 2.60*  --  2.77* 2.73*  CREATININE  --  1.25 1.32 1.23    Estimated Creatinine Clearance: 76.2 ml/min (by C-G formula based on Cr of 1.23).   Medications:  Scheduled:  . carvedilol  3.125 mg Oral BID WC  . furosemide  40 mg Oral Daily  . insulin aspart  0-15 Units Subcutaneous TID WC  . insulin aspart  0-5 Units Subcutaneous QHS  . multivitamin with minerals  1 tablet Oral Daily  . pantoprazole  40 mg Oral Q0600  . simvastatin  40 mg Oral q1800  . sodium chloride  3 mL Intravenous Q12H  . sotalol  120 mg Oral Q12H  . warfarin  2.5 mg Oral Q M,W,F-1800  . warfarin  5 mg Oral Q T,Th,S,Su-1800  . Warfarin - Pharmacist Dosing Inpatient   Does not apply q1800    Assessment: 67 yom previously admitted 05/17/2012 with new-onset atrial flutter s/p ablation and DC'd home 3/21 then re admitted 05/21/2012 with SOB and CP. Pt continues on warfarin for afib and his INR today remains therapeutic. No CBC available today, no bleeding noted.   Goal of Therapy:  INR 2-3  Plan:  - Continue coumadin 2.5mg  MWF + 5mg  TThSaSu for now  - F/u AM INR  Lillia Pauls, PharmD Clinical Pharmacist Pager: [phone] Phone: [phone] 05/29/2012 8:09 AM

## 2012-05-29 NOTE — Progress Notes (Addendum)
SUBJECTIVE:  Denies chest pain or SOB.  Still complains of LE edema which has increased and also tightness and pain in his toes   OBJECTIVE:   Vitals:   Filed Vitals:   05/28/12 1305 05/28/12 2052 05/29/12 0536 05/29/12 0842  BP: 111/72 108/70 112/71 102/64  Pulse: 63 67 68 68  Temp: 97.5 F (36.4 C) 97.6 F (36.4 C) 98.1 F (36.7 C) 97.7 F (36.5 C)  TempSrc: Oral Oral Oral Oral  Resp: 20 18 18 18   Height:      Weight:   103.42 kg (228 lb)   SpO2: 100% 100% 96% 98%   I&O's:   Intake/Output Summary (Last 24 hours) at 05/29/12 1121 Last data filed at 05/29/12 0850  Gross per 24 hour  Intake   1350 ml  Output   2475 ml  Net  -1125 ml   TELEMETRY: Reviewed telemetry pt in NSR:     PHYSICAL EXAM General: Well developed, well nourished, in no acute distress Head: Eyes PERRLA, No xanthomas.   Normal cephalic and atramatic  Lungs:   Clear bilaterally to auscultation and percussion. Heart:   HRRR S1 S2 Pulses are 2+ & equal. Abdomen: Bowel sounds are positive, abdomen soft and non-tender without masses  Extremities:  2-3+ edema of feet and LE legs Neuro: Alert and oriented X 3. Psych:  Good affect, responds appropriately   LABS: Basic Metabolic Panel:  Recent Labs  40/98/11 0500 05/29/12 0455  NA 137 135  K 4.8 3.9  CL 100 99  CO2 26 25  GLUCOSE 140* 180*  BUN 24* 23  CREATININE 1.32 1.23  CALCIUM 9.4 9.0   Coag Panel:   Lab Results  Component Value Date   INR 2.73* 05/29/2012   INR 2.77* 05/28/2012   INR 2.60* 05/27/2012    RADIOLOGY: Dg Chest 2 View  05/17/2012  *RADIOLOGY REPORT*  Clinical Data: Shortness of breath  CHEST - 2 VIEW  Comparison: 04/25/2010  Findings: Chronic interstitial markings.  Superimposed increased interstitial markings/bronchitic changes.  Vague right upper lobe opacity is possible but considered less likely.  Left basilar atelectasis.  No pleural effusion or pneumothorax.  Cardiomegaly.  Left subclavian ICD.  Degenerative changes of  the visualized thoracolumbar spine.  IMPRESSION: Increased interstitial markings/bronchitic changes.  Vague right upper lobe opacity/pneumonia is possible but considered less likely.   Original Report Authenticated By: Charline Bills, M.D.    Dg Lumbar Spine 2-3 Views  05/17/2012  *RADIOLOGY REPORT*  Clinical Data: Fall, low back pain  LUMBAR SPINE - 2-3 VIEW  Comparison: None.  Findings: Five lumbar-type vertebral bodies.  Normal lumbar lordosis.  No evidence of fracture or dislocation.  Vertebral body heights are maintained.  Mild to moderate degenerative changes.  Visualized bony pelvis is intact.  IMPRESSION: No fracture or dislocation is seen.  Mild to moderate degenerative changes.   Original Report Authenticated By: Charline Bills, M.D.    Ct Abdomen Pelvis W Contrast  05/18/2012  *RADIOLOGY REPORT*  Clinical Data: 62 year old male with abdominal, back and pelvic pain following fall.  The patient is anticoagulated.  CT ABDOMEN AND PELVIS WITH CONTRAST  Technique:  Multidetector CT imaging of the abdomen and pelvis was performed following the standard protocol during bolus administration of intravenous contrast.  Contrast: 80mL OMNIPAQUE IOHEXOL 300 MG/ML  SOLN  Comparison: None  Findings: Mild cardiomegaly and pacemaker lead identified. Small bilateral pleural effusions and bibasilar atelectasis identified.  The liver, spleen, pancreas, adrenal glands and gallbladder are unremarkable.  Scattered  areas of wedge shaped renal scarring are noted bilaterally. A wedge shaped area of decreased attenuation within the posterior lateral mid - lower left kidney is identified but does not exhibit parenchymal volume loss as the other areas of scarring.  This may represent an area of ischemia or acute - subacute infarct or less likely pyelonephritis.  The bowel, appendix and bladder are unremarkable. Prostate enlargement is present.  There is no evidence of enlarged lymph nodes, biliary dilatation or abdominal  aortic aneurysm.  There is no evidence of intra-abdominal or intrapelvic hematoma or hemorrhage.  There is minimal wedging of the L1 vertebral body and does not have an acute appearance. Moderate degenerative changes at L4-L5 is present.  IMPRESSION: No evidence of intra-abdominal or intrapelvic hemorrhage/hematoma.  Small bilateral pleural effusions with mild bibasilar atelectasis.  Scattered areas of renal scarring bilaterally with one wedge-shaped area in the mid - lower left kidney without significant parenchymal loss - infarct or pyelonephritis is not excluded although this finding is likely subacute to chronic.  Mild L1 vertebral body wedging - does not appear acute but correlate with area of pain.   Original Report Authenticated By: Harmon Pier, M.D.    US Renal  05/23/2012  *RADIOLOGY REPORT*  Clinical Data: Acute kidney infection or insult.  RENAL/URINARY TRACT ULTRASOUND COMPLETE  Comparison:  CT abdomen 05/18/2012  Findings:  Right Kidney:  Right kidney measures up to 13.4 cm in length. There is normal echogenicity of the right kidney.  There is a round hypoechoic structure in the upper pole that measures 1.2 cm and suggestive for a cyst. Negative for hydronephrosis.  Left Kidney:  Left kidney measures 13.1 cm in length.  Echogenicity of the left kidney is grossly normal. The lower pole is difficult to visualize.  Negative for hydronephrosis.  Bladder:  Normal appearance of the urinary bladder.  IMPRESSION: No acute abnormalities.  Right renal cyst.   Original Report Authenticated By: Richarda Overlie, M.D.    Dg Chest Portable 1 View  05/21/2012  *RADIOLOGY REPORT*  Clinical Data: Shortness of breath  PORTABLE CHEST - 1 VIEW  Comparison: 05/17/2012  Findings: Poor inspiratory effect.  Mild cardiac enlargement stable.  Single lead pacer with a pulse generator over left thorax, stable.  Mild vascular congestion.  IMPRESSION: Mild vascular congestion.  No evidence of focal consolidation or pulmonary edema.    Original Report Authenticated By: Esperanza Heir, M.D.     Assessment/Plan:  1. acute on chronic systolic congestive heart failure. Continue carvedilol 3.125 mg twice a day.  LE edema appears worse so will change back to IV Lasix and watch renal function closely. 2. acute kidney injury, stable creatinine stable on Lasix  3.  Ischemic DCM 4.  HTN 5.  CAD 6.  Aflutter s/p ablation     Quintella Reichert, MD  05/29/2012  11:21 AM

## 2012-05-30 LAB — PROTIME-INR
INR: 2.98 — ABNORMAL HIGH (ref 0.00–1.49)
Prothrombin Time: 29.4 seconds — ABNORMAL HIGH (ref 11.6–15.2)

## 2012-05-30 LAB — BASIC METABOLIC PANEL
BUN: 24 mg/dL — ABNORMAL HIGH (ref 6–23)
CO2: 25 mEq/L (ref 19–32)
Chloride: 98 mEq/L (ref 96–112)
Creatinine, Ser: 1.32 mg/dL (ref 0.50–1.35)
Potassium: 4.1 mEq/L (ref 3.5–5.1)

## 2012-05-30 LAB — GLUCOSE, CAPILLARY
Glucose-Capillary: 157 mg/dL — ABNORMAL HIGH (ref 70–99)
Glucose-Capillary: 203 mg/dL — ABNORMAL HIGH (ref 70–99)
Glucose-Capillary: 175 mg/dL — ABNORMAL HIGH (ref 70–99)

## 2012-05-30 MED ORDER — LOSARTAN POTASSIUM 25 MG PO TABS
25.0000 mg | ORAL_TABLET | Freq: Every day | ORAL | Status: DC
  Administered 2012-05-30 – 2012-05-31 (×2): 25 mg via ORAL
  Filled 2012-05-30 (×2): qty 1

## 2012-05-30 NOTE — Progress Notes (Signed)
Patient: [name] [name] / Admit Date: 05/21/2012 / Date of Encounter: 05/30/2012, 7:29 AM   Subjective  Denies CP, dyspnea. Lower extremity edema mildly improved but persists   Objective   Telemetry: NSR occ PVCs PACs Physical Exam: Filed Vitals:   05/30/12 0600  BP: 110/77  Pulse: 64  Temp: 98.2 F (36.8 C)  Resp: 18   General: Well developed, well nourished [name] in no acute distress. Head: Normocephalic, atraumatic Neck: JVD not elevated. Lungs: CTA Heart: RRR S1 S2 without murmurs, rubs, or gallops.  Abdomen: Soft, non-tender, non-distended Extremities: 1+ bilat LE edema.   Neuro: Alert and oriented X 3. Moves all extremities spontaneously.    Intake/Output Summary (Last 24 hours) at 05/30/12 0729 Last data filed at 05/30/12 0602  Gross per 24 hour  Intake    940 ml  Output   3125 ml  Net  -2185 ml    Inpatient Medications:  . carvedilol  3.125 mg Oral BID WC  . furosemide  40 mg Intravenous Daily  . insulin aspart  0-15 Units Subcutaneous TID WC  . insulin aspart  0-5 Units Subcutaneous QHS  . multivitamin with minerals  1 tablet Oral Daily  . pantoprazole  40 mg Oral Q0600  . simvastatin  40 mg Oral q1800  . sodium chloride  3 mL Intravenous Q12H  . sotalol  120 mg Oral Q12H  . warfarin  2.5 mg Oral Q M,W,F-1800  . warfarin  5 mg Oral Q T,Th,S,Su-1800  . Warfarin - Pharmacist Dosing Inpatient   Does not apply q1800    Labs:  Recent Labs  05/29/12 0455 05/30/12 0527  NA 135 135  K 3.9 4.1  CL 99 98  CO2 25 25  GLUCOSE 180* 223*  BUN 23 24*  CREATININE 1.23 1.32  CALCIUM 9.0 9.0    Radiology/Studies:  Dg Chest 2 View  05/17/2012  *RADIOLOGY REPORT*  Clinical Data: Shortness of breath  CHEST - 2 VIEW  Comparison: 04/25/2010  Findings: Chronic interstitial markings.  Superimposed increased interstitial markings/bronchitic changes.  Vague right upper lobe opacity is possible but considered less likely.  Left basilar atelectasis.  No pleural  effusion or pneumothorax.  Cardiomegaly.  Left subclavian ICD.  Degenerative changes of the visualized thoracolumbar spine.  IMPRESSION: Increased interstitial markings/bronchitic changes.  Vague right upper lobe opacity/pneumonia is possible but considered less likely.   Original Report Authenticated By: Charline Bills, M.D.    Dg Lumbar Spine 2-3 Views  05/17/2012  *RADIOLOGY REPORT*  Clinical Data: Fall, low back pain  LUMBAR SPINE - 2-3 VIEW  Comparison: None.  Findings: Five lumbar-type vertebral bodies.  Normal lumbar lordosis.  No evidence of fracture or dislocation.  Vertebral body heights are maintained.  Mild to moderate degenerative changes.  Visualized bony pelvis is intact.  IMPRESSION: No fracture or dislocation is seen.  Mild to moderate degenerative changes.   Original Report Authenticated By: Charline Bills, M.D.    Ct Abdomen Pelvis W Contrast  05/18/2012  *RADIOLOGY REPORT*  Clinical Data: 62 year old male with abdominal, back and pelvic pain following fall.  The patient is anticoagulated.  CT ABDOMEN AND PELVIS WITH CONTRAST  Technique:  Multidetector CT imaging of the abdomen and pelvis was performed following the standard protocol during bolus administration of intravenous contrast.  Contrast: 80mL OMNIPAQUE IOHEXOL 300 MG/ML  SOLN  Comparison: None  Findings: Mild cardiomegaly and pacemaker lead identified. Small bilateral pleural effusions and bibasilar atelectasis identified.  The liver, spleen, pancreas, adrenal glands and  gallbladder are unremarkable.  Scattered areas of wedge shaped renal scarring are noted bilaterally. A wedge shaped area of decreased attenuation within the posterior lateral mid - lower left kidney is identified but does not exhibit parenchymal volume loss as the other areas of scarring.  This may represent an area of ischemia or acute - subacute infarct or less likely pyelonephritis.  The bowel, appendix and bladder are unremarkable. Prostate enlargement is  present.  There is no evidence of enlarged lymph nodes, biliary dilatation or abdominal aortic aneurysm.  There is no evidence of intra-abdominal or intrapelvic hematoma or hemorrhage.  There is minimal wedging of the L1 vertebral body and does not have an acute appearance. Moderate degenerative changes at L4-L5 is present.  IMPRESSION: No evidence of intra-abdominal or intrapelvic hemorrhage/hematoma.  Small bilateral pleural effusions with mild bibasilar atelectasis.  Scattered areas of renal scarring bilaterally with one wedge-shaped area in the mid - lower left kidney without significant parenchymal loss - infarct or pyelonephritis is not excluded although this finding is likely subacute to chronic.  Mild L1 vertebral body wedging - does not appear acute but correlate with area of pain.   Original Report Authenticated By: Harmon Pier, M.D.    Dg Chest Portable 1 View  05/21/2012  *RADIOLOGY REPORT*  Clinical Data: Shortness of breath  PORTABLE CHEST - 1 VIEW  Comparison: 05/17/2012  Findings: Poor inspiratory effect.  Mild cardiac enlargement stable.  Single lead pacer with a pulse generator over left thorax, stable.  Mild vascular congestion.  IMPRESSION: Mild vascular congestion.  No evidence of focal consolidation or pulmonary edema.   Original Report Authenticated By: Esperanza Heir, M.D.      Assessment and Plan  1. Acute kidney injury - felt due to combination of IV contrast, LV dysfunction, borderline hypotension - wedge-shaped infarcts on CT last admission but renal doesn't think these are relevant to AKI. Renal function stable; remains volume overloaded; will give lasix 40 mg IV x 1 today; follow renal function. 2. Acute on chronic systolic CHF -  Will give lasix today as described above; continue low dose coreg; resume cozaar 25 mg daily. 3. Mod-severe MR by echo 04/2012  4. Mildly elevated troponin with history of CAD - troponin felt most likely from recent ablation. Plan outpatient Myoview.  Not on ASA due to being on Coumadin.  5. Atrial flutter s/p RFA ablation this month - remains in NSR. Coumadin per pharmacy. After 4 weeks coumadin, will change to ASA. 6. [name]/ICD - Sotalol resumed.  7. Diabetes mellitus - SSI for now (home insulin had been held). Possible DC in AM if stable and volume status improved. Signed, Olga Millers MD 7:29 AM

## 2012-05-30 NOTE — Progress Notes (Signed)
ANTICOAGULATION CONSULT NOTE - Follow Up Consult  Pharmacy Consult for warfarin Indication: Atrial Flutter  Allergies  Allergen Reactions  . Penicillins Other (See Comments)    Unknown reaction   Patient Measurements: Height: 5\' 11"  (180.3 cm) Weight: 225 lb 14.4 oz (102.468 kg) (scale B) IBW/kg (Calculated) : 75.3  Vital Signs: Temp: 98.2 F (36.8 C) (03/31 0600) Temp src: Oral (03/31 0600) BP: 110/77 mmHg (03/31 0600) Pulse Rate: 64 (03/31 0600)  Labs:  Recent Labs  05/28/12 0500 05/29/12 0455 05/30/12 0527 05/30/12 0820  LABPROT 27.9* 27.6*  --  29.4*  INR 2.77* 2.73*  --  2.98*  CREATININE 1.32 1.23 1.32  --    Estimated Creatinine Clearance: 70.7 ml/min (by C-G formula based on Cr of 1.32).  Medications:  Scheduled:  . carvedilol  3.125 mg Oral BID WC  . furosemide  40 mg Intravenous Daily  . insulin aspart  0-15 Units Subcutaneous TID WC  . insulin aspart  0-5 Units Subcutaneous QHS  . losartan  25 mg Oral Daily  . multivitamin with minerals  1 tablet Oral Daily  . pantoprazole  40 mg Oral Q0600  . simvastatin  40 mg Oral q1800  . sodium chloride  3 mL Intravenous Q12H  . sotalol  120 mg Oral Q12H  . warfarin  2.5 mg Oral Q M,W,F-1800  . warfarin  5 mg Oral Q T,Th,S,Su-1800  . Warfarin - Pharmacist Dosing Inpatient   Does not apply q1800  . [DISCONTINUED] furosemide  40 mg Intravenous BID  . [DISCONTINUED] furosemide  40 mg Oral Daily   Assessment: 83 yom previously admitted 05/17/2012 with new-onset atrial flutter s/p ablation and DC'd home 3/21 then re admitted 05/21/2012 with SOB and CP. Pt continues on warfarin for afib (currently NSR) and his INR today is therapeutic. Noted plans for 4 weeks of Warfarin and then conversion to ASA  Per Dr. [name].  Goal of Therapy:  INR 2-3  Plan:  - Continue coumadin 2.5mg  MWF + 5mg  TThSaSu for now  - F/u AM INR  [name] [name], PharmD., MS Clinical Pharmacist Pager:  [phone] Thank you for allowing  pharmacy to be part of this patients care team. 05/30/2012 9:51 AM

## 2012-05-30 NOTE — Progress Notes (Signed)
Inpatient Diabetes Program Recommendations  AACE/ADA: New Consensus Statement on Inpatient Glycemic Control  Target Ranges:  Prepandial:   less than 140 mg/dL      Peak postprandial:   less than 180 mg/dL (1-2 hours)      Critically ill patients:  140 - 180 mg/dL  Pager:  [phone] Hours:  8 am-10pm   Reason for Visit: Elevated glucose in 200s  Inpatient Diabetes Program Recommendations  Insulin - Basal: Add 70/30 BID to patient's insulin regimen   Alfredia Client PhD, RN, BC-ADM Diabetes Coordinator  Office:  [phone] Team Pager:  [phone]

## 2012-05-31 ENCOUNTER — Encounter (HOSPITAL_COMMUNITY): Payer: Self-pay | Admitting: Physician Assistant

## 2012-05-31 DIAGNOSIS — N179 Acute kidney failure, unspecified: Secondary | ICD-10-CM | POA: Clinically undetermined

## 2012-05-31 DIAGNOSIS — Z7901 Long term (current) use of anticoagulants: Secondary | ICD-10-CM

## 2012-05-31 LAB — PROTIME-INR: INR: 2.96 — ABNORMAL HIGH (ref 0.00–1.49)

## 2012-05-31 LAB — BASIC METABOLIC PANEL
Calcium: 9.1 mg/dL (ref 8.4–10.5)
GFR calc non Af Amer: 64 mL/min — ABNORMAL LOW (ref >90)
Glucose, Bld: 225 mg/dL — ABNORMAL HIGH (ref 70–99)
Potassium: 4.1 mEq/L (ref 3.5–5.1)
Sodium: 138 mEq/L (ref 135–145)

## 2012-05-31 LAB — GLUCOSE, CAPILLARY
Glucose-Capillary: 201 mg/dL — ABNORMAL HIGH (ref 70–99)
Glucose-Capillary: 257 mg/dL — ABNORMAL HIGH (ref 70–99)

## 2012-05-31 MED ORDER — INSULIN REGULAR HUMAN 100 UNIT/ML IJ SOLN: 2.0000 [IU] | Freq: Three times a day (TID) | INTRAMUSCULAR | Status: DC | PRN

## 2012-05-31 MED ORDER — FUROSEMIDE 20 MG PO TABS: 20.0000 mg | ORAL_TABLET | Freq: Every day | ORAL | Status: DC

## 2012-05-31 MED ORDER — INSULIN NPH ISOPHANE & REGULAR (70-30) 100 UNIT/ML ~~LOC~~ SUSP: 15.0000 [IU] | Freq: Two times a day (BID) | SUBCUTANEOUS | Status: DC

## 2012-05-31 MED ORDER — WARFARIN SODIUM 5 MG PO TABS: 2.5000 mg | ORAL_TABLET | Freq: Every day | ORAL | Status: DC

## 2012-05-31 MED ORDER — LOSARTAN POTASSIUM 25 MG PO TABS: 25.0000 mg | ORAL_TABLET | Freq: Every day | ORAL | Status: DC

## 2012-05-31 MED ORDER — INSULIN ASPART 100 UNIT/ML ~~LOC~~ SOLN: 2.0000 [IU] | Freq: Three times a day (TID) | SUBCUTANEOUS | Status: DC | PRN

## 2012-05-31 NOTE — Discharge Summary (Signed)
CARDIOLOGY DISCHARGE SUMMARY   Patient ID: [name] [name] MRN: [id number] DOB/AGE: 03-29-1950 62 y.o.  Admit date: 05/21/2012 Discharge date: 05/31/2012  Primary Discharge Diagnosis:     Acute on chronic systolic heart failure Secondary Discharge Diagnosis:    DIABETES MELLITUS, TYPE II   HYPERTENSION, BENIGN SYSTEMIC   RENAL INSUFFICIENCY   Atrial flutter with rapid ventricular response   Nausea with vomiting   Chronic anticoagulation    Acute kidney injury    Hypotension  Consults: Case management, diabetes coordinator, pharmacy, nephrology  Procedures:  Renal ultrasound  Hospital Course: MARIO CORONADO is a 62 y.o. male with a history of CAD, ischemic cardiomyopathy and arrhythmia. He was admitted from 3/18 through 05/20/2012 for atrial flutter and had an ablation his EF was 25%-30%. He was in sinus rhythm at discharge. On 05/21/2012, he presented with increasing shortness of breath, orthopnea and edema. He was admitted for further evaluation and treatment.  1. Acute on chronic systolic CHF: His intake/output was negative by greater than 10 L during his hospital stay. His weight decreased by total of 13 pounds. By discharge, his respiratory status had improved and his oxygen saturation was greater than 95% on room air. He will be followed closely as an outpatient.  2. Diabetes: Recent hemoglobin A1c was greater than 11. Prior to admission, he was on NovoLog and Novolin N. compliance had been a problem secondary to financial issues. He was seen by the diabetes coordinator in an effort to encourage compliance with diet and medication control of his diabetes as well as simplifying his medication regimen.  3. acute kidney injury: Mr. Lyster had some renal insufficiency on admission with a BUN of 27 a creatinine of 1.45. With diuresis, his BUN increased as high as 45 and his creatinine as high as 2.53. A nephrology consult was called. The acute kidney injury was felt due to  combination of IV contrast, LV dysfunction, and borderline hypotension. He had wedge-shaped infarcts on CT last admission but the nephrologist did not take those were significantly affecting his creatinine. A renal ultrasound was performed but showed no acute injury. His medications were adjusted and his volume status was watched very carefully. At discharge, his BUN and creatinine had improved to 23/1.19. This will be followed as an outpatient.  4. Hypotension, history of Hypertension: Mr. Dilone has a history of hypertension. He had problems with hypotension during this hospital stay when he was being diuresed. He required dopamine but by 05/27/2012, this was discontinued. His blood pressure control medications were gradually added back and do further medication adjustments can be made as an outpatient.  5. Atrial flutter with rapid ventricular response: Mr. Gladden was maintaining sinus rhythm. He had been on sotalol prior to admission for a history of 62 He was continued on the sotalol and Coreg. This will be followed as an outpatient.  6. Chronic anticoagulation: His INR was therapeutic on admission and he was continued on Coumadin during his hospital stay. He is to continue Coumadin as an outpatient but after 30 days, can be changed to aspirin only.  7. Mildly elevated troponin: Mr. Jeronimo Norma were checked or mildly elevated. This is most likely from his recent ablation. He will have an outpatient Myoview. This can be set up at his followup visit on 06/08/2012 if he is remaining stable.  05/31/2012, Mr. Ballengee his condition had significantly improved. His INR was therapeutic, he was maintaining sinus rhythm and his respiratory status was at baseline. He was evaluated by  Dr. [name] and considered stable for discharge, to follow up as an outpatient.   Labs: Lab Results  Component Value Date   WBC 6.1 05/23/2012   HGB 14.1 05/23/2012   HCT 40.7 05/23/2012   MCV 79.6 05/23/2012   PLT 201 05/23/2012      Recent Labs Lab 05/31/12 0430  NA 138  K 4.1  CL 99  CO2 27  BUN 23  CREATININE 1.19  CALCIUM 9.1  GLUCOSE 225*   Pro B Natriuretic peptide (BNP)  Date/Time Value Range Status  05/21/2012  7:00 AM 2093.0* 0 - 125 pg/mL Final  05/17/2012  9:10 AM 770.7* 0 - 125 pg/mL Final    Recent Labs  05/31/12 0430  INR 2.96*   Lab Results  Component Value Date   HGBA1C 11.5* 05/17/2012     Radiology:  US Renal 05/23/2012  *RADIOLOGY REPORT*  Clinical Data: Acute kidney infection or insult.  RENAL/URINARY TRACT ULTRASOUND COMPLETE  Comparison:  CT abdomen 05/18/2012  Findings:  Right Kidney:  Right kidney measures up to 13.4 cm in length. There is normal echogenicity of the right kidney.  There is a round hypoechoic structure in the upper pole that measures 1.2 cm and suggestive for a cyst. Negative for hydronephrosis.  Left Kidney:  Left kidney measures 13.1 cm in length.  Echogenicity of the left kidney is grossly normal. The lower pole is difficult to visualize.  Negative for hydronephrosis.  Bladder:  Normal appearance of the urinary bladder.  IMPRESSION: No acute abnormalities.  Right renal cyst.   Original Report Authenticated By: Richarda Overlie, M.D.    Dg Chest Portable 1 View 05/21/2012  *RADIOLOGY REPORT*  Clinical Data: Shortness of breath  PORTABLE CHEST - 1 VIEW  Comparison: 05/17/2012  Findings: Poor inspiratory effect.  Mild cardiac enlargement stable.  Single lead pacer with a pulse generator over left thorax, stable.  Mild vascular congestion.  IMPRESSION: Mild vascular congestion.  No evidence of focal consolidation or pulmonary edema.   Original Report Authenticated By: Esperanza Heir, M.D.    FOLLOW UP PLANS AND APPOINTMENTS Allergies  Allergen Reactions  . Penicillins Other (See Comments)    Unknown reaction     Medication List    STOP taking these medications       enoxaparin 100 MG/ML injection  Commonly known as:  LOVENOX      TAKE these medications        carvedilol 3.125 MG tablet  Commonly known as:  COREG  Take 1 tablet (3.125 mg total) by mouth 2 (two) times daily with a meal.     furosemide 20 MG tablet  Commonly known as:  LASIX  Take 1 tablet (20 mg total) by mouth daily. OK to take 1 extra tablet daily for weight gain 5 lbs.     insulin aspart 100 UNIT/ML injection  Commonly known as:  novoLOG  Inject 2-15 Units into the skin 3 (three) times daily as needed for high blood sugar. As per sliding scale instructions.     insulin NPH-insulin regular (70-30) 100 UNIT/ML injection  Commonly known as:  NOVOLIN 70/30 RELION  Inject 15 Units into the skin 2 (two) times daily with a meal.     insulin regular 100 units/mL injection  Commonly known as:  NOVOLIN R,HUMULIN R  Inject 0.02-0.15 mLs (2-15 Units total) into the skin 3 (three) times daily as needed for high blood sugar. INSTEAD of NovoLog, As per sliding scale instructions     losartan 25  MG tablet  Commonly known as:  COZAAR  Take 1 tablet (25 mg total) by mouth daily.     magnesium oxide 400 MG tablet  Commonly known as:  MAG-OX  Take 1 tablet (400 mg total) by mouth daily.     multivitamin tablet  Take 1 tablet by mouth daily.     pantoprazole 40 MG tablet  Commonly known as:  PROTONIX  Take 1 tablet (40 mg total) by mouth daily at 6 (six) AM.     simvastatin 40 MG tablet  Commonly known as:  ZOCOR  Take 1 tablet (40 mg total) by mouth daily at 6 PM.     sotalol 120 MG tablet  Commonly known as:  BETAPACE  Take 1 tablet (120 mg total) by mouth 2 (two) times daily.     warfarin 5 MG tablet  Commonly known as:  COUMADIN  Take 0.5-1 tablets (2.5-5 mg total) by mouth daily with supper. Take 1/2 tab daily except 1 tab on Mon/Wed/Fri        Discharge Orders   Future Appointments Provider Department Dept Phone   06/03/2012 3:15 PM [facility] Main Office [place]) [phone]   06/03/2012 3:30 PM [facility]  [facility] [phone]   06/08/2012 3:20 PM Beatrice Lecher, PA-C [facility] Main Office [place]) [phone]   06/29/2012 2:15 PM [name], MD [facility] Main Office [place]) [phone]   Future Orders Complete By Expires     (HEART FAILURE PATIENTS) Call MD:  Anytime you have any of the following symptoms: 1) 3 pound weight gain in 24 hours or 5 pounds in 1 week 2) shortness of breath, with or without a dry hacking cough 3) swelling in the hands, feet or stomach 4) if you have to sleep on extra pillows at night in order to breathe.  As directed     Ambulatory referral to Nutrition and Diabetic Education  As directed     Comments:      HgbA1C of 11.5%    Diet - low sodium heart healthy  As directed     Diet Carb Modified  As directed     Increase activity slowly  As directed       Follow-up Information   Follow up with [facility]. (Home health nurse for heart failure)    Contact information:   [phone]      Follow up with [name], MD On 06/29/2012. (at 2:15 pm)    Contact information:   [address], [address] [place] [address] [phone]       Follow up with [facility] On 06/03/2012. (Coumadin check and blood work at 3:15 pm)    Contact information:   [address] [place] [address]       Follow up with Tereso Newcomer, PA-C On 06/08/2012. (See for Dr [name] at 3:20 pm)    Contact information:   [address] [place] [address] [phone]       BRING ALL MEDICATIONS WITH YOU TO FOLLOW UP APPOINTMENTS  Time spent with patient to include physician time: 46 min Signed: Theodore Demark, PA-C 05/31/2012, 10:27 AM Co-Sign MD

## 2012-05-31 NOTE — Discharge Summary (Signed)
See progress notes [name] [name]  

## 2012-05-31 NOTE — Progress Notes (Signed)
Inpatient Diabetes Program Recommendations  AACE/ADA: New Consensus Statement on Inpatient Glycemic Control (2013)  Target Ranges:  Prepandial:   less than 140 mg/dL      Peak postprandial:   less than 180 mg/dL (1-2 hours)      Critically ill patients:  140 - 180 mg/dL   Diabetes Coordinator spoke with patient concerning home insulin regimen.  Patient reports taking 2 different insulins one long acting and the other short acting.  Patient lost insurance and had not been taking them for a few months.  Upon review of current medication reconciliation it is reported that the patient takes 2 similar insulins (Novolog and R-regular) but the patient denies this and said he was supposed to be on a long acting and a short acting.  The patient now has 2 new vials of insulin at his bedside.  He said they were prescribed at his last hospital discharge 05-20-12.  He did not realize that they were similar and that one was not a long or intermediate acting.   Patient needs a basal insulin and a simplified regimen so patient agrees with starting ReliOn ([employer] brand) Novolin 70/30 15 units with breakfast and supper.  He will also have the Novolog and Regular to use for his correction scale as needed.  Specific instructions have been given for the scale (Gr[place]all Glycemic Control scale MODERATE and HS scale) and to use one or the other (Novolog or Novolin R) not both. The patient was able to teach back the above instructions.  He was also able to teach back the signs and symptoms of HYPOglycemia and how to treat.  He does have a glucose meter at home.  No further questions/concerns at this time.   Thank you  Piedad Climes BSN, RN,CDE Inpatient Diabetes Coordinator [phone] (team pager)

## 2012-05-31 NOTE — Progress Notes (Signed)
ANTICOAGULATION CONSULT NOTE - Follow Up Consult  Pharmacy Consult for warfarin Indication: Atrial Flutter  Allergies  Allergen Reactions  . Penicillins Other (See Comments)    Unknown reaction   Patient Measurements: Height: 5\' 11"  (180.3 cm) Weight: 223 lb 12.3 oz (101.5 kg) IBW/kg (Calculated) : 75.3  Vital Signs: Temp: 98.5 F (36.9 C) (04/01 0603) Temp src: Oral (04/01 0603) BP: 107/68 mmHg (04/01 0603) Pulse Rate: 64 (04/01 0603)  Labs:  Recent Labs  05/29/12 0455 05/30/12 0527 05/30/12 0820 05/31/12 0430  LABPROT 27.6*  --  29.4* 29.3*  INR 2.73*  --  2.98* 2.96*  CREATININE 1.23 1.32  --  1.19   Estimated Creatinine Clearance: 78.1 ml/min (by C-G formula based on Cr of 1.19).  Medications:  Scheduled:  . carvedilol  3.125 mg Oral BID WC  . furosemide  40 mg Intravenous Daily  . insulin aspart  0-15 Units Subcutaneous TID WC  . insulin aspart  0-5 Units Subcutaneous QHS  . losartan  25 mg Oral Daily  . multivitamin with minerals  1 tablet Oral Daily  . pantoprazole  40 mg Oral Q0600  . simvastatin  40 mg Oral q1800  . sodium chloride  3 mL Intravenous Q12H  . sotalol  120 mg Oral Q12H  . warfarin  2.5 mg Oral Q M,W,F-1800  . warfarin  5 mg Oral Q T,Th,S,Su-1800  . Warfarin - Pharmacist Dosing Inpatient   Does not apply q1800   Assessment: 23 yom previously admitted 05/17/2012 with new-onset atrial flutter s/p ablation and DC'd home 3/21 then re admitted 05/21/2012 with SOB and CP. Pt continues on warfarin for afib (currently NSR) and his INR today is therapeutic. No complications noted.  Noted plans for 4 weeks of Warfarin and then conversion to ASA  Per Dr. [name].  Noted plans to d/c home today with INR check Friday in the office.  Goal of Therapy:  INR 2-3  Plan:  - Continue coumadin 2.5mg  MWF + 5mg  TThSaSu for now  - F/u AM INR  Jill Side L. Illene Bolus, PharmD, BCPS Clinical Pharmacist Pager: [phone] Pharmacy: [phone] 05/31/2012 8:13  AM

## 2012-05-31 NOTE — Progress Notes (Signed)
Patient: [name] [name] / Admit Date: 05/21/2012 / Date of Encounter: 05/31/2012, 6:45 AM   Subjective  Denies CP, dyspnea. Lower extremity edema improving   Objective   Telemetry: NSR occ PVCs PACs Physical Exam: Filed Vitals:   05/31/12 0603  BP: 107/68  Pulse: 64  Temp: 98.5 F (36.9 C)  Resp: 18   General: Well developed, well nourished [name] in no acute distress. Head: Normocephalic, atraumatic Neck: JVD not elevated. Lungs: CTA Heart: RRR S1 S2 without murmurs, rubs, or gallops.  Abdomen: Soft, non-tender, non-distended Extremities: 1+ bilat ankle edema.   Neuro: Alert and oriented X 3. Moves all extremities spontaneously.    Intake/Output Summary (Last 24 hours) at 05/31/12 0645 Last data filed at 05/31/12 0609  Gross per 24 hour  Intake    520 ml  Output   3000 ml  Net  -2480 ml    Inpatient Medications:  . carvedilol  3.125 mg Oral BID WC  . furosemide  40 mg Intravenous Daily  . insulin aspart  0-15 Units Subcutaneous TID WC  . insulin aspart  0-5 Units Subcutaneous QHS  . losartan  25 mg Oral Daily  . multivitamin with minerals  1 tablet Oral Daily  . pantoprazole  40 mg Oral Q0600  . simvastatin  40 mg Oral q1800  . sodium chloride  3 mL Intravenous Q12H  . sotalol  120 mg Oral Q12H  . warfarin  2.5 mg Oral Q M,W,F-1800  . warfarin  5 mg Oral Q T,Th,S,Su-1800  . Warfarin - Pharmacist Dosing Inpatient   Does not apply q1800    Labs:  Recent Labs  05/30/12 0527 05/31/12 0430  NA 135 138  K 4.1 4.1  CL 98 99  CO2 25 27  GLUCOSE 223* 225*  BUN 24* 23  CREATININE 1.32 1.19  CALCIUM 9.0 9.1    Radiology/Studies:  Dg Chest 2 View  05/17/2012  *RADIOLOGY REPORT*  Clinical Data: Shortness of breath  CHEST - 2 VIEW  Comparison: 04/25/2010  Findings: Chronic interstitial markings.  Superimposed increased interstitial markings/bronchitic changes.  Vague right upper lobe opacity is possible but considered less likely.  Left basilar atelectasis.  No  pleural effusion or pneumothorax.  Cardiomegaly.  Left subclavian ICD.  Degenerative changes of the visualized thoracolumbar spine.  IMPRESSION: Increased interstitial markings/bronchitic changes.  Vague right upper lobe opacity/pneumonia is possible but considered less likely.   Original Report Authenticated By: Charline Bills, M.D.    Dg Lumbar Spine 2-3 Views  05/17/2012  *RADIOLOGY REPORT*  Clinical Data: Fall, low back pain  LUMBAR SPINE - 2-3 VIEW  Comparison: None.  Findings: Five lumbar-type vertebral bodies.  Normal lumbar lordosis.  No evidence of fracture or dislocation.  Vertebral body heights are maintained.  Mild to moderate degenerative changes.  Visualized bony pelvis is intact.  IMPRESSION: No fracture or dislocation is seen.  Mild to moderate degenerative changes.   Original Report Authenticated By: Charline Bills, M.D.    Ct Abdomen Pelvis W Contrast  05/18/2012  *RADIOLOGY REPORT*  Clinical Data: 62 year old male with abdominal, back and pelvic pain following fall.  The patient is anticoagulated.  CT ABDOMEN AND PELVIS WITH CONTRAST  Technique:  Multidetector CT imaging of the abdomen and pelvis was performed following the standard protocol during bolus administration of intravenous contrast.  Contrast: 80mL OMNIPAQUE IOHEXOL 300 MG/ML  SOLN  Comparison: None  Findings: Mild cardiomegaly and pacemaker lead identified. Small bilateral pleural effusions and bibasilar atelectasis identified.  The liver,  spleen, pancreas, adrenal glands and gallbladder are unremarkable.  Scattered areas of wedge shaped renal scarring are noted bilaterally. A wedge shaped area of decreased attenuation within the posterior lateral mid - lower left kidney is identified but does not exhibit parenchymal volume loss as the other areas of scarring.  This may represent an area of ischemia or acute - subacute infarct or less likely pyelonephritis.  The bowel, appendix and bladder are unremarkable. Prostate  enlargement is present.  There is no evidence of enlarged lymph nodes, biliary dilatation or abdominal aortic aneurysm.  There is no evidence of intra-abdominal or intrapelvic hematoma or hemorrhage.  There is minimal wedging of the L1 vertebral body and does not have an acute appearance. Moderate degenerative changes at L4-L5 is present.  IMPRESSION: No evidence of intra-abdominal or intrapelvic hemorrhage/hematoma.  Small bilateral pleural effusions with mild bibasilar atelectasis.  Scattered areas of renal scarring bilaterally with one wedge-shaped area in the mid - lower left kidney without significant parenchymal loss - infarct or pyelonephritis is not excluded although this finding is likely subacute to chronic.  Mild L1 vertebral body wedging - does not appear acute but correlate with area of pain.   Original Report Authenticated By: Harmon Pier, M.D.    Dg Chest Portable 1 View  05/21/2012  *RADIOLOGY REPORT*  Clinical Data: Shortness of breath  PORTABLE CHEST - 1 VIEW  Comparison: 05/17/2012  Findings: Poor inspiratory effect.  Mild cardiac enlargement stable.  Single lead pacer with a pulse generator over left thorax, stable.  Mild vascular congestion.  IMPRESSION: Mild vascular congestion.  No evidence of focal consolidation or pulmonary edema.   Original Report Authenticated By: Esperanza Heir, M.D.      Assessment and Plan  1. Acute kidney injury - felt due to combination of IV contrast, LV dysfunction, borderline hypotension - wedge-shaped infarcts on CT last admission but renal doesn't think these are relevant to AKI. Renal function stable. 2. Acute on chronic systolic CHF -  continue low dose coreg and cozaar 25 mg daily. Volume status improving; DC on lasix 20 mg po daily with additional 20 mg po daily as needed for edema. 3. Mod-severe MR by echo 04/2012  4. Mildly elevated troponin with history of CAD - troponin felt most likely from recent ablation. Plan outpatient Myoview. Not on ASA due  to being on Coumadin.  5. Atrial flutter s/p RFA ablation this month - remains in NSR. After 4 weeks coumadin from time of ablation, will change to ASA. 6. [name]/ICD - Sotalol resumed.  7. Diabetes mellitus - SSI for now (home insulin had been held). Plan DC today; check INR in coumadin clinic on Friday (4/4); Check BMET at that time as well.  FU with me in 1-2 weeks >30 min PA and physician time D2 Signed, Olga Millers MD 6:45 AM

## 2012-05-31 NOTE — Progress Notes (Signed)
DC IV, DC Tele, DC Home. Discharge instructions discussed with patient. Patient denied any questions or concerns at this time. Patient leaving unit via wheel chair and appears in no acute distress. 

## 2012-06-01 ENCOUNTER — Telehealth: Payer: Self-pay

## 2012-06-01 ENCOUNTER — Other Ambulatory Visit: Payer: Self-pay

## 2012-06-01 DIAGNOSIS — I472 Ventricular tachycardia: Secondary | ICD-10-CM

## 2012-06-01 DIAGNOSIS — I10 Essential (primary) hypertension: Secondary | ICD-10-CM

## 2012-06-01 MED ORDER — LOSARTAN POTASSIUM 25 MG PO TABS: 25.0000 mg | ORAL_TABLET | Freq: Every day | ORAL | Status: DC

## 2012-06-01 MED ORDER — MAGNESIUM OXIDE 400 MG PO TABS: 400.0000 mg | ORAL_TABLET | Freq: Every day | ORAL | Status: DC

## 2012-06-01 NOTE — Telephone Encounter (Addendum)
TCM Call:  Pts wife, [name] [name], states that the pt is doing well since hosp discharge and has no complaints at this time. She states that they did not pick up pts Losartan from pharmacy because they did not have discount cards from the hospital for Losartan and they thought medication would cost to much. Pts wife is advised that pt must take his meds as directed to maintain his health and to avoid having to go to ED and that I am re-sending Losartan RX to [facility] to fill and if med is to expensive to please call office to let us know, she verbalized understanding and states that they will pick up tonight. Pts wife states that they are aware of eph f/u with Tereso Newcomer, PA-C on 4/9 @ 3:20. [name] given office phone number to call if they have any questions or concerns, she verbalized understanding.

## 2012-06-03 ENCOUNTER — Ambulatory Visit (INDEPENDENT_AMBULATORY_CARE_PROVIDER_SITE_OTHER): Payer: Self-pay | Admitting: *Deleted

## 2012-06-03 DIAGNOSIS — I10 Essential (primary) hypertension: Secondary | ICD-10-CM

## 2012-06-03 DIAGNOSIS — I4892 Unspecified atrial flutter: Secondary | ICD-10-CM | POA: Insufficient documentation

## 2012-06-03 DIAGNOSIS — E785 Hyperlipidemia, unspecified: Secondary | ICD-10-CM

## 2012-06-03 LAB — BASIC METABOLIC PANEL
BUN: 19 mg/dL (ref 6–23)
Creatinine, Ser: 1 mg/dL (ref 0.4–1.5)
GFR: 93 mL/min (ref >60.00)
Glucose, Bld: 149 mg/dL — ABNORMAL HIGH (ref 70–99)
Potassium: 4.1 mEq/L (ref 3.5–5.1)

## 2012-06-06 ENCOUNTER — Encounter: Payer: Self-pay | Admitting: *Deleted

## 2012-06-08 ENCOUNTER — Encounter: Payer: Self-pay | Admitting: Physician Assistant

## 2012-06-08 ENCOUNTER — Ambulatory Visit (INDEPENDENT_AMBULATORY_CARE_PROVIDER_SITE_OTHER): Payer: Self-pay | Admitting: Physician Assistant

## 2012-06-08 ENCOUNTER — Ambulatory Visit (INDEPENDENT_AMBULATORY_CARE_PROVIDER_SITE_OTHER): Payer: Self-pay | Admitting: *Deleted

## 2012-06-08 VITALS — BP 122/82 | HR 65 | Ht 71.0 in | Wt 222.8 lb

## 2012-06-08 DIAGNOSIS — I4892 Unspecified atrial flutter: Secondary | ICD-10-CM

## 2012-06-08 DIAGNOSIS — I340 Nonrheumatic mitral (valve) insufficiency: Secondary | ICD-10-CM

## 2012-06-08 DIAGNOSIS — I2510 Atherosclerotic heart disease of native coronary artery without angina pectoris: Secondary | ICD-10-CM

## 2012-06-08 DIAGNOSIS — I5022 Chronic systolic (congestive) heart failure: Secondary | ICD-10-CM

## 2012-06-08 DIAGNOSIS — N189 Chronic kidney disease, unspecified: Secondary | ICD-10-CM

## 2012-06-08 DIAGNOSIS — I10 Essential (primary) hypertension: Secondary | ICD-10-CM

## 2012-06-08 DIAGNOSIS — I472 Ventricular tachycardia: Secondary | ICD-10-CM

## 2012-06-08 DIAGNOSIS — I214 Non-ST elevation (NSTEMI) myocardial infarction: Secondary | ICD-10-CM

## 2012-06-08 DIAGNOSIS — E785 Hyperlipidemia, unspecified: Secondary | ICD-10-CM

## 2012-06-08 NOTE — Progress Notes (Signed)
[address], [address] [place], [place]  [address] Phone: [phone] Fax:  [phone]  Date:  06/08/2012   ID:  [name] [name], DOB 12/27/1950, MRN [id number]  PCP:  Aida Puffer, MD  Primary Cardiologist:  Dr. Olga Millers   Primary Electrophysiologists:  Dr. Lewayne Bunting and Dr. [name]    History of Present Illness: [name] [name] is a 62 y.o. male who returns for f/u after 2 recent admissions to the hospital.  He has a hx of CAD, s/p prior PCI of RCA and LAD, ICM, systolic CHF, VTach s/p ICD Rx 04/2010 (Sotalol added to regimen), [name], HTN, HL, CKD.   LHC 7/06:  EF 35-40%, mLAD 99%, dLAD 90%, mid to dist CFX 70%, RCA occluded.  PCI: DES to mid and dist LAD.  Myoview 11/2008: EF 27%, prior apical and inf scar but no ischemia.  Carotids 4/12: 0-39% bilat ICA.  Last seen by Dr. Olga Millers 07/2011.    He was admitted 3/18-3/21 after presenting with back pain after a fall. He reported orthopnea and PND and was found to be in new onset atrial flutter. He was placed on Coumadin. He underwent TEE guided EPS + RFCA for  his atrial flutter 05/19/12.  He was readmitted 3/22-4/1 after presenting with worsening dyspnea. He was volume overloaded and admitted for further diuresis. He was hypotensive with worsening creatinine. He was placed on dopamine. He diuresed 10 L and lost 13 pounds. He was seen by nephrology. AKI was felt to be due to combination of IV contrast, LV dysfunction and borderline hypotension. Of note, he did have a wedge-shaped renal infarct on a CT scan during his last admission but the nephrologist did not think that this was significantly affecting his creatinine. Creatinine improved prior to discharge. Of note, he did have a mildly elevated troponin. It was felt that this was likely related to his recent RFCA for atrial flutter. OP myoview was planned.  Since d/c he is doing well.  Denies CP or significant dyspnea.  He is NYHA Class II-IIb.  No orthopnea, PND,  edema.  No syncope.  No palpitations.    Labs (3/14:  K 4.1, Cr 2.53=>1.32, ALT 23, LDL 112, Hgb 14.1, TSH 3.534 Labs (4/14): K 4.1, Cr 1.0  Wt Readings from Last 3 Encounters:  06/08/12 222 lb 12.8 oz (101.061 kg)  05/31/12 223 lb 12.3 oz (101.5 kg)  05/20/12 233 lb 0.4 oz (105.7 kg)     Past Medical History  Diagnosis Date  . DM type 2 (diabetes mellitus, type 2)   . Ischemic cardiomyopathy   . HTN (hypertension)   . Hypercholesteremia     ablation  . Ventricular tachycardia   . CAD (coronary artery disease) 1324,4010 X 2     RCA-T, 70% PL (off CFX), 99% Prox LAD/90% Dist LAD, S/P TAXUS stent x 2  . ICD (implantable cardiac defibrillator) in place   . CKD (chronic kidney disease)   . Nephrolithiasis   . Atrial flutter 04/2012    s/p TEE-EPS+RFCA 04/2012  . Chronic anticoagulation   . Chronic systolic heart failure     Current Outpatient Prescriptions  Medication Sig Dispense Refill  . carvedilol (COREG) 3.125 MG tablet Take 1 tablet (3.125 mg total) by mouth 2 (two) times daily with a meal.  60 tablet  3  . furosemide (LASIX) 20 MG tablet Take 1 tablet (20 mg total) by mouth daily. OK to take 1 extra tablet daily for weight gain 5  lbs.  60 tablet  11  . insulin aspart (NOVOLOG) 100 UNIT/ML injection Inject 2-15 Units into the skin 3 (three) times daily as needed for high blood sugar. As per sliding scale instructions.  1 vial  4  . insulin NPH-insulin regular (NOVOLIN 70/30 RELION) (70-30) 100 UNIT/ML injection Inject 15 Units into the skin 2 (two) times daily with a meal.  10 mL  12  . losartan (COZAAR) 25 MG tablet Take 1 tablet (25 mg total) by mouth daily.  30 tablet  3  . magnesium oxide (MAG-OX) 400 MG tablet Take 1 tablet (400 mg total) by mouth daily.  90 tablet  3  . pantoprazole (PROTONIX) 40 MG tablet Take 1 tablet (40 mg total) by mouth daily at 6 (six) AM.  30 tablet  3  . simvastatin (ZOCOR) 40 MG tablet Take 1 tablet (40 mg total) by mouth daily at 6 PM.  30  tablet  3  . sotalol (BETAPACE) 120 MG tablet Take 1 tablet (120 mg total) by mouth 2 (two) times daily.  60 tablet  3  . warfarin (COUMADIN) 5 MG tablet Take 0.5-1 tablets (2.5-5 mg total) by mouth daily with supper. Take 1/2 tab daily except 1 tab on Mon/Wed/Fri  30 tablet  6   No current facility-administered medications for this visit.    Allergies:    Allergies  Allergen Reactions  . Penicillins Other (See Comments)    Unknown reaction    Social History:  The patient  reports that he quit smoking about 12 years ago. He does not have any smokeless tobacco history on file. He reports that he does not drink alcohol or use illicit drugs.   ROS:  Please see the history of present illness.      All other systems reviewed and negative.   PHYSICAL EXAM: VS:  BP 122/82  Pulse 65  Ht 5\' 11"  (1.803 m)  Wt 222 lb 12.8 oz (101.061 kg)  BMI 31.09 kg/m2 Well nourished, well developed, in no acute distress HEENT: normal Neck: no JVD Cardiac:  normal S1, S2; RRR; no murmur Lungs:  Decreased breath sounds bilaterally, no wheezing, rhonchi or rales Abd: soft, nontender, no hepatomegaly Ext: no edema Skin: warm and dry Neuro:  CNs 2-12 intact, no focal abnormalities noted  EKG:  NSR, HR 65, LAD, Inf Q waves, anteroseptal Q waves, lat TWI, no change from prior tracings.      ASSESSMENT AND PLAN:  1. Chronic Systolic CHF:  Improved.  Volume stable.  BP has been soft limiting titration of medications.  Continue current Rx.  Recent BMET with stable creatinine.  He knows to weigh daily and to call with sudden weight gain or increased dyspnea.  2. CAD:  He had mildly elevated troponin during recent hospital stay.  It was thought this was related to his recent RFCA.  ? Type 2 NSTEMI.  OP myoview was recommended.  I have recommended we proceed with this.  But the patient does not want to schedule as he has no insurance.  He is not having angina.  Continue statin.  Resume ASA once off of  coumadin. 3. Atrial Flutter:  Maintaining NSR.  Continue coumadin.  F/u with Dr. [name] as planned.  Records indicate he will come off of coumadin 4 weeks post cardioversion.  Defer timing of d/c of coumadin to Dr. [name]. 4. CKD:  Creatinine improved.  Continue current Rx. 5. Mod to Severe Mitral Regurgitation:  Symptoms stable.  Continue current Rx. 6. VTach, s/p ICD:  Continue sotalol.  F/u with EP as planned. 7. Diabetes Mellitus:  F/u with primary care. 8. Hypertension:  Controlled.  Continue current therapy.  9. Hyperlipidemia:  Continue statin.  10. Disposition:  F/u with Dr. [name] as planned.  Schedule f/u with Dr. Olga Millers in 6 weeks.   [name] Glasgow, PA-C  4:08 PM 06/08/2012

## 2012-06-08 NOTE — Patient Instructions (Addendum)
Your physician recommends that you continue on your current medications as directed. Please refer to the Current Medication list given to you today.   Your physician has requested that you have a lexiscan myoview. For further information please visit [email or web address] Please follow instruction sheet, as given.  Our office made you schedule a follow-up appointment with DR. CRESHAW ON MAY 22 @ 9:00 AM ON THURSDAY   Your physician recommends that you KEEP YOUR follow-up appointment WITH DR Johney Frame ON April 30 @ 2:15 PM

## 2012-06-09 ENCOUNTER — Telehealth: Payer: Self-pay | Admitting: *Deleted

## 2012-06-10 NOTE — Telephone Encounter (Signed)
Sent to [facility].

## 2012-06-14 ENCOUNTER — Encounter: Payer: Self-pay | Admitting: *Deleted

## 2012-06-15 ENCOUNTER — Ambulatory Visit (INDEPENDENT_AMBULATORY_CARE_PROVIDER_SITE_OTHER): Payer: Self-pay | Admitting: Pharmacist

## 2012-06-15 DIAGNOSIS — I4892 Unspecified atrial flutter: Secondary | ICD-10-CM

## 2012-06-29 ENCOUNTER — Encounter: Payer: Self-pay | Admitting: Internal Medicine

## 2012-06-29 ENCOUNTER — Ambulatory Visit (INDEPENDENT_AMBULATORY_CARE_PROVIDER_SITE_OTHER): Payer: Self-pay | Admitting: Internal Medicine

## 2012-06-29 VITALS — BP 105/71 | HR 69 | Ht 70.0 in | Wt 225.6 lb

## 2012-06-29 DIAGNOSIS — Z7901 Long term (current) use of anticoagulants: Secondary | ICD-10-CM

## 2012-06-29 DIAGNOSIS — I4892 Unspecified atrial flutter: Secondary | ICD-10-CM

## 2012-06-29 DIAGNOSIS — I472 Ventricular tachycardia: Secondary | ICD-10-CM

## 2012-06-29 DIAGNOSIS — I519 Heart disease, unspecified: Secondary | ICD-10-CM | POA: Insufficient documentation

## 2012-06-29 DIAGNOSIS — I4729 Other ventricular tachycardia: Secondary | ICD-10-CM

## 2012-06-29 DIAGNOSIS — Z95810 Presence of automatic (implantable) cardiac defibrillator: Secondary | ICD-10-CM

## 2012-06-29 LAB — ICD DEVICE OBSERVATION
BRDY-0002RV: 40 {beats}/min
RV LEAD IMPEDENCE ICD: 368 Ohm
TZAT-0001FASTVT: 1
TZAT-0001SLOWVT: 2
TZAT-0004FASTVT: 8
TZAT-0004SLOWVT: 8
TZAT-0004SLOWVT: 8
TZAT-0005FASTVT: 88 pct
TZAT-0011FASTVT: 10 ms
TZAT-0012SLOWVT: 200 ms
TZAT-0020SLOWVT: 1.6 ms
TZAT-0020SLOWVT: 1.6 ms
TZON-0003SLOWVT: 350 ms
TZON-0011AFLUTTER: 70
TZST-0001FASTVT: 2
TZST-0001FASTVT: 5
TZST-0001SLOWVT: 3
TZST-0001SLOWVT: 5
TZST-0003FASTVT: 25 J
TZST-0003FASTVT: 35 J
TZST-0003FASTVT: 35 J
TZST-0003SLOWVT: 15 J

## 2012-06-29 MED ORDER — CARVEDILOL 3.125 MG PO TABS: 3.1250 mg | ORAL_TABLET | Freq: Two times a day (BID) | ORAL | Status: DC

## 2012-06-29 MED ORDER — FUROSEMIDE 20 MG PO TABS: 20.0000 mg | ORAL_TABLET | Freq: Every day | ORAL | Status: DC

## 2012-06-29 MED ORDER — LOSARTAN POTASSIUM 25 MG PO TABS: 25.0000 mg | ORAL_TABLET | Freq: Every day | ORAL | Status: DC

## 2012-06-29 MED ORDER — SOTALOL HCL 120 MG PO TABS: 120.0000 mg | ORAL_TABLET | Freq: Two times a day (BID) | ORAL | Status: DC

## 2012-06-29 MED ORDER — SIMVASTATIN 40 MG PO TABS: 40.0000 mg | ORAL_TABLET | Freq: Every day | ORAL | Status: DC

## 2012-06-29 MED ORDER — ASPIRIN EC 81 MG PO TBEC: 81.0000 mg | DELAYED_RELEASE_TABLET | Freq: Every day | ORAL | Status: DC

## 2012-06-29 NOTE — Progress Notes (Signed)
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  Since his recent atrial flutter ablation, the patient reports doing very well.  Today, he denies symptoms of palpitations, chest pain, shortness of breath,  lower extremity edema, dizziness, presyncope, syncope, or ICD shocks.  The patient is otherwise without complaint today.   Past Medical History  Diagnosis Date  . DM type 2 (diabetes mellitus, type 2)   . Ischemic cardiomyopathy   . HTN (hypertension)   . Hypercholesteremia     ablation  . Ventricular tachycardia   . CAD (coronary artery disease) 9147,8295 X 2     RCA-T, 70% PL (off CFX), 99% Prox LAD/90% Dist LAD, S/P TAXUS stent x 2  . ICD (implantable cardiac defibrillator) in place   . CKD (chronic kidney disease)   . Nephrolithiasis   . Atrial flutter 04/2012    s/p TEE-EPS+RFCA 04/2012  . Chronic anticoagulation   . Chronic systolic heart failure    Past Surgical History  Procedure Laterality Date  . Percutaneous coronary stent intervention (pci-s)  January 2002    PTCA/Stent Distal RCA  . Cardiac defibrillator placement  2007     Medtronic Maximo VR, serial number [id number]  . Percutaneous coronary stent intervention (pci-s)  June 2002    PTCA/Stent x 3 RCA, thrombolysis - failed  . Percutaneous coronary stent intervention (pci-s)  July 2006    TAXUS stents to prox and distal LAD    Current Outpatient Prescriptions  Medication Sig Dispense Refill  . carvedilol (COREG) 3.125 MG tablet Take 1 tablet (3.125 mg total) by mouth 2 (two) times daily with a meal.  60 tablet  3  . furosemide (LASIX) 20 MG tablet Take 1 tablet (20 mg total) by mouth daily. OK to take 1 extra tablet daily for weight gain 5 lbs.  60 tablet  11  . insulin aspart (NOVOLOG) 100 UNIT/ML injection Inject 2-15 Units into the skin 3 (three) times daily as needed for high blood sugar. As per sliding scale  instructions.  1 vial  4  . insulin NPH-insulin regular (NOVOLIN 70/30 RELION) (70-30) 100 UNIT/ML injection Inject 15 Units into the skin 2 (two) times daily with a meal.  10 mL  12  . losartan (COZAAR) 25 MG tablet Take 1 tablet (25 mg total) by mouth daily.  30 tablet  3  . magnesium oxide (MAG-OX) 400 MG tablet Take 1 tablet (400 mg total) by mouth daily.  90 tablet  3  . pantoprazole (PROTONIX) 40 MG tablet Take 1 tablet (40 mg total) by mouth daily at 6 (six) AM.  30 tablet  3  . simvastatin (ZOCOR) 40 MG tablet Take 1 tablet (40 mg total) by mouth daily at 6 PM.  30 tablet  3  . sotalol (BETAPACE) 120 MG tablet Take 1 tablet (120 mg total) by mouth 2 (two) times daily.  60 tablet  3   No current facility-administered medications for this visit.    Physical Exam: Filed Vitals:   06/29/12 1422  BP: 105/71  Pulse: 69  Height: 5\' 10"  (1.778 m)  Weight: 225 lb 9.6 oz (102.331 kg)    GEN- The patient is well appearing, alert and oriented x 3 today.   Head- normocephalic, atraumatic Eyes-  Sclera clear, conjunctiva pink Ears- hearing intact Oropharynx- clear Lungs- Clear to ausculation bilaterally, normal work of breathing Chest- ICD pocket is well healed Heart-  Regular rate and rhythm, no murmurs, rubs or gallops, PMI not laterally displaced GI- soft, NT, ND, + BS Extremities- no clubbing, cyanosis, or edema  ICD interrogation- reviewed in detail today,  See PACEART report ekg today reveals sinus rhythm with LAA, inferior infarction, anterior infarction  Assessment and Plan:   1. Atrial flutter Resolved s/p ablation stop coumadin Return to asa  2.  Chronic systolic dysfunction euvolemic today Stable on an appropriate medical regimen Normal ICD function See Pace Art report No changes today  3. VT Controlled QT is stable Continue sotalol No arrhythmias by ICD interrogation today  Return to the device clinic in 3 months Follow-up with Dr Ladona Ridgel in 6 months

## 2012-06-29 NOTE — Patient Instructions (Addendum)
Your physician recommends that you schedule a follow-up appointment in: 3 months with device clinic and 6 months with Dr. Ladona Ridgel.

## 2012-06-30 ENCOUNTER — Emergency Department (INDEPENDENT_AMBULATORY_CARE_PROVIDER_SITE_OTHER)
Admission: EM | Admit: 2012-06-30 | Discharge: 2012-06-30 | Disposition: A | Discharge status: Home / Self Care | Payer: Self-pay | Source: Home / Self Care

## 2012-06-30 ENCOUNTER — Encounter (HOSPITAL_COMMUNITY): Payer: Self-pay

## 2012-06-30 ENCOUNTER — Ambulatory Visit: Payer: Self-pay | Admitting: *Deleted

## 2012-06-30 DIAGNOSIS — I10 Essential (primary) hypertension: Secondary | ICD-10-CM

## 2012-06-30 DIAGNOSIS — I519 Heart disease, unspecified: Secondary | ICD-10-CM

## 2012-06-30 DIAGNOSIS — Z95810 Presence of automatic (implantable) cardiac defibrillator: Secondary | ICD-10-CM

## 2012-06-30 DIAGNOSIS — I4892 Unspecified atrial flutter: Secondary | ICD-10-CM

## 2012-06-30 NOTE — ED Notes (Signed)
Patient presents with a history of DM Congestive heart failure

## 2012-06-30 NOTE — ED Provider Notes (Addendum)
Patient Demographics  [name] [name], is a 61 y.o. male  ZOX:[id number]  WJX:[id number]  DOB - [birth year]  Chief Complaint  Patient presents with  . Diabetes  . Congestive Heart Failure        Subjective:   [name] [name] today is here to establish primary care. Patient has a past medical history of diabetes, hypertension, ventricular tachycardia status post AICD placement, history of atrial flutter status post ablation now no longer on Coumadin, chronic systolic heart failure. He was seen by EP M.D. yesterday, he has been taken off his Coumadin and placed on aspirin. Patient has no major complaints, except that some of his medications are unaffordable. During his recent hospitalization, he was placed on insulin 70/30 given his significantly elevated A1c.  Patient has No headache, No chest pain, No abdominal pain - No Nausea, No new weakness tingling or numbness, No Cough - SOB.   Objective:    Filed Vitals:   06/30/12 1749  BP: 101/63  Pulse: 56  Temp: 97.7 F (36.5 C)  TempSrc: Oral  Resp: 16  SpO2: 99%     ALLERGIES:   Allergies  Allergen Reactions  . Penicillins Other (See Comments)    Unknown reaction    PAST MEDICAL HISTORY: Past Medical History  Diagnosis Date  . DM type 2 (diabetes mellitus, type 2)   . Ischemic cardiomyopathy   . HTN (hypertension)   . Hypercholesteremia     ablation  . Ventricular tachycardia   . CAD (coronary artery disease) 2130,8657 X 2     RCA-T, 70% PL (off CFX), 99% Prox LAD/90% Dist LAD, S/P TAXUS stent x 2  . ICD (implantable cardiac defibrillator) in place   . CKD (chronic kidney disease)   . Nephrolithiasis   . Atrial flutter 04/2012    s/p TEE-EPS+RFCA 04/2012  . Chronic anticoagulation   . Chronic systolic heart failure     PAST SURGICAL HISTORY: Past Surgical History  Procedure Laterality Date  . Percutaneous coronary stent intervention (pci-s)  January 2002    PTCA/Stent Distal RCA  . Cardiac defibrillator  placement  2007     Medtronic Maximo VR, serial number [id number]  . Percutaneous coronary stent intervention (pci-s)  June 2002    PTCA/Stent x 3 RCA, thrombolysis - failed  . Percutaneous coronary stent intervention (pci-s)  July 2006    TAXUS stents to prox and distal LAD    FAMILY HISTORY: Mother-CAD  MEDICATIONS AT HOME: Prior to Admission medications   Medication Sig Start Date End Date Taking? Authorizing Provider  aspirin EC 81 MG tablet Take 1 tablet (81 mg total) by mouth daily. 06/29/12   [name], MD  carvedilol (COREG) 3.125 MG tablet Take 1 tablet (3.125 mg total) by mouth 2 (two) times daily with a meal. 06/29/12   [name], MD  furosemide (LASIX) 20 MG tablet Take 1 tablet (20 mg total) by mouth daily. OK to take 1 extra tablet daily for weight gain 5 lbs. 06/29/12   [name], MD  insulin aspart (NOVOLOG) 100 UNIT/ML injection Inject 2-15 Units into the skin 3 (three) times daily as needed for high blood sugar. As per sliding scale instructions. 05/31/12   Rhonda G Barrett, PA-C  insulin NPH-insulin regular (NOVOLIN 70/30 RELION) (70-30) 100 UNIT/ML injection Inject 15 Units into the skin 2 (two) times daily with a meal. 05/31/12   Rhonda G Barrett, PA-C  losartan (COZAAR) 25 MG tablet Take 1 tablet (25 mg total) by mouth daily. 06/29/12  [name], MD  magnesium oxide (MAG-OX) 400 MG tablet Take 1 tablet (400 mg total) by mouth daily. 06/01/12   Beatrice Lecher, PA-C  simvastatin (ZOCOR) 40 MG tablet Take 1 tablet (40 mg total) by mouth daily at 6 PM. 06/29/12   [name], MD  sotalol (BETAPACE) 120 MG tablet Take 1 tablet (120 mg total) by mouth 2 (two) times daily. 06/29/12   [name], MD    REVIEW OF SYSTEMS:  Constitutional:   No   Fevers, chills, fatigue.  HEENT:    No headaches, Sore throat,   Cardio-vascular: No chest pain,  Orthopnea, swelling in lower extremities, anasarca, palpitations  GI:  No abdominal pain, nausea, vomiting,  diarrhea  Resp: No shortness of breath,  No coughing up of blood.No cough.No wheezing.  Skin:  no rash or lesions.  GU:  no dysuria, change in color of urine, no urgency or frequency.  No flank pain.  Musculoskeletal: No joint pain or swelling.  No decreased range of motion.  No back pain.  Psych: No change in mood or affect. No depression or anxiety.  No memory loss.   Exam  General appearance :Awake, alert, not in any distress. Speech Clear. Not toxic Looking HEENT: Atraumatic and Normocephalic, pupils equally reactive to light and accomodation Neck: supple, no JVD. No cervical lymphadenopathy.  Chest:Good air entry bilaterally, no added sounds  CVS: S1 S2 regular, no murmurs.  Abdomen: Bowel sounds present, Non tender and not distended with no gaurding, rigidity or rebound. Extremities: B/L Lower Ext shows no edema, both legs are warm to touch Neurology: Awake alert, and oriented X 3, CN II-XII intact, Non focal Skin:No Rash Wounds:N/A    Data Review   CBC No results found for this basename: WBC, HGB, HCT, PLT, MCV, MCH, MCHC, RDW, NEUTRABS, LYMPHSABS, MONOABS, EOSABS, BASOSABS, BANDABS, BANDSABD,  in the last 168 hours  Chemistries   No results found for this basename: NA, K, CL, CO2, GLUCOSE, BUN, CREATININE, GFRCGP, CALCIUM, MG, AST, ALT, ALKPHOS, BILITOT,  in the last 168 hours ------------------------------------------------------------------------------------------------------------------ No results found for this basename: HGBA1C,  in the last 72 hours ------------------------------------------------------------------------------------------------------------------ No results found for this basename: CHOL, HDL, LDLCALC, TRIG, CHOLHDL, LDLDIRECT,  in the last 72 hours ------------------------------------------------------------------------------------------------------------------ No results found for this basename: TSH, T4TOTAL, FREET3, T3FREE, THYROIDAB,  in  the last 72 hours ------------------------------------------------------------------------------------------------------------------ No results found for this basename: VITAMINB12, FOLATE, FERRITIN, TIBC, IRON, RETICCTPCT,  in the last 72 hours  Coagulation profile  No results found for this basename: INR, PROTIME,  in the last 168 hours    Assessment & Plan   Active Problems: Chronic systolic heart failure - Clinically compensated - Continue losartan, Coreg and Lasix - Is a follow appointment with Dr. [name] on 5/22.  Diabetes - Continue with current dozing off insulin 70/30 - Have asked patient to keep a record of his CBGs in the next visit - Recheck A1c next visit  Hypertension - Currently controlled - Continue with Coreg losartan and Lasix  History of atrial flutter status post ablation - Follows up with cardiology- now no longer on Coumadin and just maintained on aspirin.  History of V. Tach - On sotalol - Has AICD in place  I have asked patient to go to [employer] on 5/2-and see which of the medications are a problem for him financially. We will see if we can adjust some of these medications. We have provided some samples of insulin 70/30 for the time being. He  will call us tomorrow and let us know.   Follow-up Information   Follow up with [facility]. Schedule an appointment as soon as possible for a visit in 2 months.      Maretta Bees, MD 06/30/12 1812  Maretta Bees, MD 06/30/12 [phone]

## 2012-07-01 ENCOUNTER — Telehealth (HOSPITAL_COMMUNITY): Payer: Self-pay

## 2012-07-01 NOTE — ED Notes (Signed)
Pharmacy called wanting to change simvastatin Per Dr Jerral Ralph ok to change to lovastatin 20mg  Take two a day

## 2012-07-06 ENCOUNTER — Telehealth: Payer: Self-pay | Admitting: Cardiology

## 2012-07-06 NOTE — Telephone Encounter (Signed)
New problem   Calling about an order that faxed 06/09/12

## 2012-07-06 NOTE — Telephone Encounter (Signed)
Myriam Jacobson, with [facility], states that they faxed over an order that needs Dr. Ludwig Clarks signature on 4/10 and they have not heard back from [place]. Please fax form to ATTN: Myriam Jacobson @ [phone].

## 2012-07-13 NOTE — Telephone Encounter (Signed)
Orders have already been faxed back.

## 2012-07-21 ENCOUNTER — Encounter: Payer: Self-pay | Admitting: Cardiology

## 2012-07-21 ENCOUNTER — Ambulatory Visit (INDEPENDENT_AMBULATORY_CARE_PROVIDER_SITE_OTHER): Payer: Self-pay | Admitting: Cardiology

## 2012-07-21 VITALS — BP 122/82 | HR 71 | Ht 70.0 in | Wt 226.0 lb

## 2012-07-21 DIAGNOSIS — I472 Ventricular tachycardia: Secondary | ICD-10-CM

## 2012-07-21 DIAGNOSIS — I2510 Atherosclerotic heart disease of native coronary artery without angina pectoris: Secondary | ICD-10-CM

## 2012-07-21 DIAGNOSIS — E785 Hyperlipidemia, unspecified: Secondary | ICD-10-CM

## 2012-07-21 DIAGNOSIS — I2589 Other forms of chronic ischemic heart disease: Secondary | ICD-10-CM

## 2012-07-21 DIAGNOSIS — I519 Heart disease, unspecified: Secondary | ICD-10-CM

## 2012-07-21 MED ORDER — CARVEDILOL 6.25 MG PO TABS: 6.2500 mg | ORAL_TABLET | Freq: Two times a day (BID) | ORAL | Status: DC

## 2012-07-21 NOTE — Assessment & Plan Note (Signed)
euvolemic on examination. Continue present dose of diuretic.

## 2012-07-21 NOTE — Assessment & Plan Note (Signed)
Continue aspirin and statin. He is working on Magazine features editor. I would like to pursue a Myoview once he is able.

## 2012-07-21 NOTE — Assessment & Plan Note (Signed)
Continue statin. 

## 2012-07-21 NOTE — Progress Notes (Signed)
HPI: Pleasant male for fu of CAD (s/p prior PCI of RCA and LAD), ICM, systolic CHF, VTach s/p ICD Rx 04/2010 (Sotalol added to regimen), [name], HTN, HL, CKD. LHC 7/06: EF 35-40%, mLAD 99%, dLAD 90%, mid to dist CFX 70%, RCA occluded. PCI: DES to mid and dist LAD. Myoview 11/2008: EF 27%, prior apical and inf scar but no ischemia. Carotids 4/12: 0-39% bilat ICA. Admitted in March of 2014 with atrial flutter. He underwent TEE guided EPS + RFCA for his atrial flutter 05/19/12. Note his ejection fraction was 25-30% and there was moderate mitral regurgitation. There was moderate left atrial enlargement and mild right atrial enlargement. Subsequently readmitted for CHF. Had AKI felt to be due to combination of IV contrast, LV dysfunction and borderline hypotension. Of note, he did have a mildly elevated troponin. It was felt that this was likely related to his recent RFCA for atrial flutter. OP myoview was planned but pt declined because of finances. Since he was last seen, he denies dyspnea, chest pain, palpitations or syncope.   Current Outpatient Prescriptions  Medication Sig Dispense Refill  . aspirin EC 81 MG tablet Take 1 tablet (81 mg total) by mouth daily.  90 tablet  3  . carvedilol (COREG) 3.125 MG tablet Take 1 tablet (3.125 mg total) by mouth 2 (two) times daily with a meal.  180 tablet  3  . furosemide (LASIX) 20 MG tablet Take 1 tablet (20 mg total) by mouth daily. OK to take 1 extra tablet daily for weight gain 5 lbs.  90 tablet  3  . insulin NPH-insulin regular (NOVOLIN 70/30 RELION) (70-30) 100 UNIT/ML injection Inject 15 Units into the skin 2 (two) times daily with a meal.  10 mL  12  . losartan (COZAAR) 25 MG tablet Take 1 tablet (25 mg total) by mouth daily.  90 tablet  3  . lovastatin (MEVACOR) 20 MG tablet Take 20 mg by mouth at bedtime. 2 tabs      . magnesium oxide (MAG-OX) 400 MG tablet Take 1 tablet (400 mg total) by mouth daily.  90 tablet  3  . sotalol (BETAPACE) 120 MG tablet Take  1 tablet (120 mg total) by mouth 2 (two) times daily.  180 tablet  3   No current facility-administered medications for this visit.     Past Medical History  Diagnosis Date  . DM type 2 (diabetes mellitus, type 2)   . Ischemic cardiomyopathy   . HTN (hypertension)   . Hypercholesteremia     ablation  . Ventricular tachycardia   . CAD (coronary artery disease) 7829,5621 X 2     RCA-T, 70% PL (off CFX), 99% Prox LAD/90% Dist LAD, S/P TAXUS stent x 2  . ICD (implantable cardiac defibrillator) in place   . CKD (chronic kidney disease)   . Nephrolithiasis   . Atrial flutter 04/2012    s/p TEE-EPS+RFCA 04/2012  . Chronic anticoagulation   . Chronic systolic heart failure     Past Surgical History  Procedure Laterality Date  . Percutaneous coronary stent intervention (pci-s)  January 2002    PTCA/Stent Distal RCA  . Cardiac defibrillator placement  2007     Medtronic Maximo VR, serial number [id number]  . Percutaneous coronary stent intervention (pci-s)  June 2002    PTCA/Stent x 3 RCA, thrombolysis - failed  . Percutaneous coronary stent intervention (pci-s)  July 2006    TAXUS stents to prox and distal LAD    History  Social History  . Marital Status: Married    Spouse Name: N/A    Number of Children: 3  . 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 Smoking status: Former Smoker    Quit date: 03/02/2000  . Smokeless tobacco: Not on file  . Alcohol Use: No  . Drug Use: No  . Sexually Active: Not on file   Other Topics Concern  . Not on file   Social History Narrative   Pt lives with wife. He hunts regularly and is active, walking > 1 mile at times without difficulty.    ROS: no fevers or chills, productive cough, hemoptysis, dysphasia, odynophagia, melena, hematochezia, dysuria, hematuria, rash, seizure activity, orthopnea, PND, pedal edema, claudication. Remaining systems are  negative.  Physical Exam: Well-developed well-nourished in no acute distress.  Skin is warm and dry.  HEENT is normal.  Neck is supple.  Chest is clear to auscultation with normal expansion.  Cardiovascular exam is regular rate and rhythm.  Abdominal exam nontender or distended. No masses palpated. Extremities show no edema. neuro grossly intact

## 2012-07-21 NOTE — Assessment & Plan Note (Signed)
Continue sotalol

## 2012-07-21 NOTE — Assessment & Plan Note (Signed)
Management per electrophysiology. 

## 2012-07-21 NOTE — Patient Instructions (Addendum)
Your physician recommends that you schedule a follow-up appointment in: 3 MONTHS WITH DR CRENSHAW  INCREASE CARVEDILOL TO 6.25 MG TWICE DAILY

## 2012-07-21 NOTE — Assessment & Plan Note (Signed)
Status post ablation. 

## 2012-07-21 NOTE — Assessment & Plan Note (Addendum)
Continue ARB. Increase carvedilol to 6.25 mg by mouth twice a day. Will plan followup echo in 3-6 months for LV function and mitral regurgitation now that he is in sinus rhythm.

## 2012-07-22 ENCOUNTER — Telehealth: Payer: Self-pay | Admitting: Cardiology

## 2012-07-22 NOTE — Telephone Encounter (Signed)
Left message for [name] [name], I do not have any out standing orders for this pt. She will need to refax anything that needs to be signed.

## 2012-07-22 NOTE — Telephone Encounter (Signed)
New Prob      Would like to speak to nurse regarding some orders that need to be signed regarding home health services. Please call.

## 2012-08-04 ENCOUNTER — Telehealth: Payer: Self-pay | Admitting: Internal Medicine

## 2012-08-04 ENCOUNTER — Other Ambulatory Visit: Payer: Self-pay | Admitting: *Deleted

## 2012-08-04 MED ORDER — LOVASTATIN 20 MG PO TABS: 20.0000 mg | ORAL_TABLET | Freq: Every day | ORAL | Status: DC

## 2012-08-04 NOTE — Telephone Encounter (Signed)
Selena Batten will you please call for me.  Thanks  Looks like its a cholesterol med and [name] is his primary cardiologist

## 2012-08-04 NOTE — Telephone Encounter (Signed)
New problem     Need prescription clarification

## 2012-08-15 NOTE — Telephone Encounter (Signed)
Someone already called pharmacy and corrected this prescrpition

## 2012-09-22 ENCOUNTER — Encounter: Payer: Self-pay | Admitting: Cardiology

## 2012-10-04 ENCOUNTER — Encounter: Payer: Self-pay | Admitting: Cardiology

## 2012-10-04 ENCOUNTER — Ambulatory Visit (INDEPENDENT_AMBULATORY_CARE_PROVIDER_SITE_OTHER): Payer: Self-pay | Admitting: Cardiology

## 2012-10-04 VITALS — BP 128/83 | HR 58 | Ht 70.0 in | Wt 229.8 lb

## 2012-10-04 DIAGNOSIS — I2589 Other forms of chronic ischemic heart disease: Secondary | ICD-10-CM

## 2012-10-04 DIAGNOSIS — Z95810 Presence of automatic (implantable) cardiac defibrillator: Secondary | ICD-10-CM

## 2012-10-04 DIAGNOSIS — I255 Ischemic cardiomyopathy: Secondary | ICD-10-CM

## 2012-10-04 DIAGNOSIS — I472 Ventricular tachycardia: Secondary | ICD-10-CM

## 2012-10-04 DIAGNOSIS — Z5181 Encounter for therapeutic drug level monitoring: Secondary | ICD-10-CM

## 2012-10-04 DIAGNOSIS — I2510 Atherosclerotic heart disease of native coronary artery without angina pectoris: Secondary | ICD-10-CM

## 2012-10-04 DIAGNOSIS — I5022 Chronic systolic (congestive) heart failure: Secondary | ICD-10-CM

## 2012-10-04 LAB — ICD DEVICE OBSERVATION
BRDY-0002RV: 40 {beats}/min
FVT: 0
RV LEAD IMPEDENCE ICD: 384 Ohm
RV LEAD THRESHOLD: 1 V
TZAT-0001FASTVT: 1
TZAT-0001SLOWVT: 1
TZAT-0001SLOWVT: 2
TZAT-0004SLOWVT: 8
TZAT-0004SLOWVT: 8
TZAT-0005SLOWVT: 84 pct
TZAT-0005SLOWVT: 91 pct
TZAT-0011FASTVT: 10 ms
TZAT-0013SLOWVT: 2
TZAT-0013SLOWVT: 2
TZAT-0018SLOWVT: NEGATIVE
TZAT-0018SLOWVT: NEGATIVE
TZAT-0019FASTVT: 8 V
TZAT-0020FASTVT: 1.6 ms
TZAT-0020SLOWVT: 1.6 ms
TZAT-0020SLOWVT: 1.6 ms
TZON-0008FASTVT: 0 ms
TZST-0001FASTVT: 2
TZST-0001FASTVT: 3
TZST-0001FASTVT: 4
TZST-0001FASTVT: 6
TZST-0001SLOWVT: 3
TZST-0001SLOWVT: 5
TZST-0001SLOWVT: 6
TZST-0003FASTVT: 25 J
TZST-0003FASTVT: 35 J
TZST-0003SLOWVT: 25 J
TZST-0003SLOWVT: 35 J
VF: 0

## 2012-10-04 NOTE — Patient Instructions (Addendum)
Your physician recommends that you continue on your current medications as directed. Please refer to the Current Medication list given to you today.  Your physician recommends that you schedule a follow-up appointment in: 3 months Defib Check

## 2012-10-04 NOTE — Progress Notes (Signed)
ELECTROPHYSIOLOGY OFFICE NOTE  Patient ID: [name] [name] MRN: [id number], DOB/AGE: [birth year]   Date of Visit: 10/04/2012  Primary Physician: No primary provider on file. Primary Cardiologist / EP: [name], MD / Ladona Ridgel, MD Reason for Visit: EP/device follow-up  History of Present Illness  [name] [name] is a very pleasant 62 y.o. male with an ischemic CM s/p single chamber ICD implant, chronic systolic HF, paroxysmal VT on sotalol and atrial flutter s/p ablation March 2014 who presents today for routine electrophysiology followup. Since last being seen in our clinic, he reports he is doing well and has no complaints. He denies chest pain or shortness of breath. He denies palpitations, dizziness, near syncope or syncope. He denies LE swelling, orthopnea, PND or recent weight gain. He walks daily, weather permitting, without difficulty or limitation. He is compliant and tolerating medications without difficulty.  Past Medical History Past Medical History  Diagnosis Date  . DM type 2 (diabetes mellitus, type 2)   . Ischemic cardiomyopathy   . HTN (hypertension)   . Hypercholesteremia     ablation  . Ventricular tachycardia   . CAD (coronary artery disease) 7829,5621 X 2     RCA-T, 70% PL (off CFX), 99% Prox LAD/90% Dist LAD, S/P TAXUS stent x 2  . ICD (implantable cardiac defibrillator) in place   . CKD (chronic kidney disease)   . Nephrolithiasis   . Atrial flutter 04/2012    s/p TEE-EPS+RFCA 04/2012  . Chronic anticoagulation   . Chronic systolic heart failure     Past Surgical History Past Surgical History  Procedure Laterality Date  . Percutaneous coronary stent intervention (pci-s)  January 2002    PTCA/Stent Distal RCA  . Cardiac defibrillator placement  2007     Medtronic Maximo VR, serial number [id number]  . Percutaneous coronary stent intervention (pci-s)  June 2002    PTCA/Stent x 3 RCA, thrombolysis - failed  . Percutaneous coronary stent intervention (pci-s)   July 2006    TAXUS stents to prox and distal LAD    Allergies/Intolerances Allergies  Allergen Reactions  . Penicillins Other (See Comments)    Unknown reaction   Current Home Medications Current Outpatient Prescriptions  Medication Sig Dispense Refill  . aspirin EC 81 MG tablet Take 1 tablet (81 mg total) by mouth daily.  90 tablet  3  . carvedilol (COREG) 6.25 MG tablet Take 1 tablet (6.25 mg total) by mouth 2 (two) times daily with a meal.  180 tablet  3  . furosemide (LASIX) 20 MG tablet Take 1 tablet (20 mg total) by mouth daily. OK to take 1 extra tablet daily for weight gain 5 lbs.  90 tablet  3  . insulin NPH-insulin regular (NOVOLIN 70/30 RELION) (70-30) 100 UNIT/ML injection Inject 15 Units into the skin 2 (two) times daily with a meal.  10 mL  12  . losartan (COZAAR) 25 MG tablet Take 1 tablet (25 mg total) by mouth daily.  90 tablet  3  . lovastatin (MEVACOR) 20 MG tablet Take 1 tablet (20 mg total) by mouth at bedtime. 2 tabs  30 tablet  5  . magnesium oxide (MAG-OX) 400 MG tablet Take 1 tablet (400 mg total) by mouth daily.  90 tablet  3  . Multiple Vitamins-Minerals (MULTIVITAMIN PO) Take by mouth daily.      . sotalol (BETAPACE) 120 MG tablet Take 1 tablet (120 mg total) by mouth 2 (two) times daily.  180 tablet  3   No  current facility-administered medications for this visit.   Social History Social History  . 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 Smoking status: Former Smoker    Quit date: 03/02/2000  . Smokeless tobacco: Not on file  . Alcohol Use: No  . Drug Use: No  . Sexually Active: Not on file   Social History Narrative   Pt lives with wife. He hunts regularly and is active, walking > 1 mile at times without difficulty.    Review of Systems General: No chills, fever, night sweats or weight changes Cardiovascular: No chest pain, dyspnea on exertion, edema, orthopnea,  palpitations, paroxysmal nocturnal dyspnea Dermatological: No rash, lesions or masses Respiratory: No cough, dyspnea Urologic: No hematuria, dysuria Abdominal: No nausea, vomiting, diarrhea, bright red blood per rectum, melena, or hematemesis Neurologic: No visual changes, weakness, changes in mental status All other systems reviewed and are otherwise negative except as noted above.  Physical Exam Vitals: Blood pressure 128/83, pulse 58, height 5\' 10"  (1.778 m), weight 229 lb 12.8 oz (104.237 kg).  General: Well developed, well appearing 62 y.o. male in no acute distress. HEENT: Normocephalic, atraumatic. EOMs intact. Sclera nonicteric. Oropharynx clear.  Neck: Supple. No JVD. Lungs: Respirations regular and unlabored, CTA bilaterally. No wheezes, rales or rhonchi. Heart: RRR. S1, S2 present. No murmurs, rub, S3 or S4. Abdomen: Soft, non-tender, non-distended. BS present x 4 quadrants. No hepatosplenomegaly.  Extremities: No clubbing, cyanosis or edema. PT/Radials 2+ and equal bilaterally. Psych: Normal affect. Neuro: Alert and oriented X 3. Moves all extremities spontaneously.   Diagnostics 12-lead ECG today shows sinus bradycardia at 56 bpm; QRS 102 msec; manual QT/QTc 420/406 msec Device interrogation today - Normal device function. Thresholds and sensing consistent with previous device measurements. Impedance trends stable over time. 4 NST episodes, longest 14 beats in duration, no EGMs. Otherwise no evidence of any ventricular arrhythmias. Histogram distribution appropriate for patient and level of activity. No changes made this session. Device programmed at appropriate safety margins. Device programmed to optimize intrinsic conduction.   Assessment and Plan 1. Ischemic CM s/p ICD implant Normal device function No programming changes made Continue routine ICD follow-up in device clinic every 3 months Return for follow-up with Dr. Ladona Ridgel in one year 2. Paroxysmal VT Stable  QTc  stable Recent BMET April 2014 - serum Cr stable at 1.0 Continue sotalol and BB 3. Chronic systolic HF Stable without worsening HF symptoms Euvolemic by exam Continue medical therapy 4. CAD Stable without anginal symptoms Continue medical therapy  Signed, Rick Duff, PA-C 10/04/2012, 9:17 AM

## 2012-10-28 ENCOUNTER — Ambulatory Visit (INDEPENDENT_AMBULATORY_CARE_PROVIDER_SITE_OTHER): Payer: Self-pay | Admitting: Cardiology

## 2012-10-28 ENCOUNTER — Encounter: Payer: Self-pay | Admitting: Cardiology

## 2012-10-28 VITALS — BP 129/85 | HR 61 | Ht 70.0 in | Wt 236.1 lb

## 2012-10-28 DIAGNOSIS — E785 Hyperlipidemia, unspecified: Secondary | ICD-10-CM

## 2012-10-28 DIAGNOSIS — I472 Ventricular tachycardia: Secondary | ICD-10-CM

## 2012-10-28 DIAGNOSIS — I2510 Atherosclerotic heart disease of native coronary artery without angina pectoris: Secondary | ICD-10-CM

## 2012-10-28 DIAGNOSIS — E7800 Pure hypercholesterolemia, unspecified: Secondary | ICD-10-CM

## 2012-10-28 DIAGNOSIS — Z95810 Presence of automatic (implantable) cardiac defibrillator: Secondary | ICD-10-CM

## 2012-10-28 DIAGNOSIS — I10 Essential (primary) hypertension: Secondary | ICD-10-CM

## 2012-10-28 DIAGNOSIS — I4892 Unspecified atrial flutter: Secondary | ICD-10-CM

## 2012-10-28 DIAGNOSIS — I2589 Other forms of chronic ischemic heart disease: Secondary | ICD-10-CM

## 2012-10-28 MED ORDER — LOSARTAN POTASSIUM 50 MG PO TABS: 50.0000 mg | ORAL_TABLET | Freq: Every day | ORAL | Status: DC

## 2012-10-28 NOTE — Assessment & Plan Note (Signed)
Blood pressure controlled. Continue present medications. 

## 2012-10-28 NOTE — Progress Notes (Signed)
HPI: Pleasant male for fu of CAD (s/p prior PCI of RCA and LAD), ICM, systolic CHF, VTach s/p ICD Rx 04/2010 (Sotalol added to regimen), [name], HTN, HL, CKD. LHC 7/06: EF 35-40%, mLAD 99%, dLAD 90%, mid to dist CFX 70%, RCA occluded. PCI: DES to mid and dist LAD. Myoview 11/2008: EF 27%, prior apical and inf scar but no ischemia. Carotids 4/12: 0-39% bilat ICA. Admitted in March of 2014 with atrial flutter. He underwent TEE guided EPS + RFCA for his atrial flutter 05/19/12. Note his ejection fraction was 25-30% and there was moderate mitral regurgitation. There was moderate left atrial enlargement and mild right atrial enlargement. Subsequently readmitted for CHF. Had AKI felt to be due to combination of IV contrast, LV dysfunction and borderline hypotension. Of note, he did have a mildly elevated troponin. It was felt that this was likely related to his recent RFCA for atrial flutter. OP myoview was planned but pt declined because of finances. Since he was last seen, he denies dyspnea, chest pain, palpitations or syncope.   Current Outpatient Prescriptions  Medication Sig Dispense Refill  . aspirin 325 MG EC tablet Take 325 mg by mouth daily.      . carvedilol (COREG) 6.25 MG tablet Take 1 tablet (6.25 mg total) by mouth 2 (two) times daily with a meal.  180 tablet  3  . furosemide (LASIX) 20 MG tablet Take 1 tablet (20 mg total) by mouth daily. OK to take 1 extra tablet daily for weight gain 5 lbs.  90 tablet  3  . insulin NPH-insulin regular (NOVOLIN 70/30 RELION) (70-30) 100 UNIT/ML injection Inject 15 Units into the skin 2 (two) times daily with a meal.  10 mL  12  . losartan (COZAAR) 25 MG tablet Take 1 tablet (25 mg total) by mouth daily.  90 tablet  3  . lovastatin (MEVACOR) 20 MG tablet Take 1 tablet (20 mg total) by mouth at bedtime. 2 tabs  30 tablet  5  . magnesium oxide (MAG-OX) 400 MG tablet Take 1 tablet (400 mg total) by mouth daily.  90 tablet  3  . Multiple Vitamins-Minerals  (MULTIVITAMIN PO) Take by mouth daily.      . sotalol (BETAPACE) 120 MG tablet Take 1 tablet (120 mg total) by mouth 2 (two) times daily.  180 tablet  3   No current facility-administered medications for this visit.     Past Medical History  Diagnosis Date  . DM type 2 (diabetes mellitus, type 2)   . Ischemic cardiomyopathy   . HTN (hypertension)   . Hypercholesteremia     ablation  . Ventricular tachycardia   . CAD (coronary artery disease) 9562,1308 X 2     RCA-T, 70% PL (off CFX), 99% Prox LAD/90% Dist LAD, S/P TAXUS stent x 2  . ICD (implantable cardiac defibrillator) in place   . CKD (chronic kidney disease)   . Nephrolithiasis   . Atrial flutter 04/2012    s/p TEE-EPS+RFCA 04/2012  . Chronic anticoagulation   . Chronic systolic heart failure     Past Surgical History  Procedure Laterality Date  . Percutaneous coronary stent intervention (pci-s)  January 2002    PTCA/Stent Distal RCA  . Cardiac defibrillator placement  2007     Medtronic Maximo VR, serial number [id number]  . Percutaneous coronary stent intervention (pci-s)  June 2002    PTCA/Stent x 3 RCA, thrombolysis - failed  . Percutaneous coronary stent intervention (pci-s)  July 2006  TAXUS stents to prox and distal LAD    History   Social History  . Marital Status: Married    Spouse Name: N/A    Number of Children: 3  . 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 Smoking status: Former Smoker    Quit date: 03/02/2000  . Smokeless tobacco: Not on file  . Alcohol Use: No  . Drug Use: No  . Sexual Activity: Not on file   Other Topics Concern  . Not on file   Social History Narrative   Pt lives with wife. He hunts regularly and is active, walking > 1 mile at times without difficulty.    ROS: no fevers or chills, productive cough, hemoptysis, dysphasia, odynophagia, melena, hematochezia, dysuria, hematuria, rash, seizure  activity, orthopnea, PND, pedal edema, claudication. Remaining systems are negative.  Physical Exam: Well-developed well-nourished in no acute distress.  Skin is warm and dry.  HEENT is normal.  Neck is supple.  Chest is clear to auscultation with normal expansion.  Cardiovascular exam is regular rate and rhythm.  Abdominal exam nontender or distended. No masses palpated. Extremities show no edema. neuro grossly intact  ECG sinus rhythm at a rate of 61. Prior anterior and inferior infarct. First degree AV block.

## 2012-10-28 NOTE — Assessment & Plan Note (Signed)
Continue statin. Check lipids and liver. 

## 2012-10-28 NOTE — Patient Instructions (Addendum)
Your physician wants you to follow-up in: 6 MONTHS WITH DR [name] You will receive a reminder letter in the mail two months in advance. If you don't receive a letter, please call our office to schedule the follow-up appointment.   INCREASE LOSARTAN TO 50 MG ONCE DAILY  Your physician recommends that you return for lab work in: ONE WEEK = DO NOT EAT PRIOR TO LAB WORK

## 2012-10-28 NOTE — Assessment & Plan Note (Signed)
Patient appears euvolemic on examination. Continue present dose of diuretic.

## 2012-10-28 NOTE — Assessment & Plan Note (Signed)
Continue sotalol

## 2012-10-28 NOTE — Assessment & Plan Note (Addendum)
Continue aspirin and statin. I would like for him to have a nuclear study given elevation in troponin previously felt secondary to atrial flutter ablation. He is working on [employer] and would like to defer until that is in place. I would also like for him to have a repeat echocardiogram but we will again defer this until his financial situation is better.

## 2012-10-28 NOTE — Assessment & Plan Note (Signed)
Status post ablation. 

## 2012-10-28 NOTE — Assessment & Plan Note (Signed)
Increase Cozaar to 50 mg daily. Check potassium and renal function in one week. Continue beta blocker.

## 2012-10-28 NOTE — Assessment & Plan Note (Signed)
Management per electrophysiology. 

## 2012-12-08 ENCOUNTER — Encounter: Payer: Self-pay | Admitting: Internal Medicine

## 2013-01-10 ENCOUNTER — Encounter: Payer: Self-pay | Admitting: Internal Medicine

## 2013-01-17 ENCOUNTER — Other Ambulatory Visit: Payer: Self-pay | Admitting: Internal Medicine

## 2013-01-23 ENCOUNTER — Other Ambulatory Visit: Payer: Self-pay | Admitting: *Deleted

## 2013-01-23 NOTE — Telephone Encounter (Signed)
Confirmed with patients wife 2 tablets mevacor at bedtime Per last note from Dr [name] 2 tablets

## 2013-04-17 ENCOUNTER — Encounter: Payer: Self-pay | Admitting: Cardiology

## 2013-04-17 ENCOUNTER — Ambulatory Visit (INDEPENDENT_AMBULATORY_CARE_PROVIDER_SITE_OTHER): Payer: 59 | Admitting: Cardiology

## 2013-04-17 VITALS — BP 126/90 | HR 73 | Ht 71.0 in | Wt 243.0 lb

## 2013-04-17 DIAGNOSIS — I340 Nonrheumatic mitral (valve) insufficiency: Secondary | ICD-10-CM | POA: Insufficient documentation

## 2013-04-17 DIAGNOSIS — I059 Rheumatic mitral valve disease, unspecified: Secondary | ICD-10-CM

## 2013-04-17 DIAGNOSIS — Z95810 Presence of automatic (implantable) cardiac defibrillator: Secondary | ICD-10-CM

## 2013-04-17 DIAGNOSIS — I2589 Other forms of chronic ischemic heart disease: Secondary | ICD-10-CM

## 2013-04-17 DIAGNOSIS — I4892 Unspecified atrial flutter: Secondary | ICD-10-CM

## 2013-04-17 MED ORDER — SPIRONOLACTONE 25 MG PO TABS: 12.5000 mg | ORAL_TABLET | Freq: Every day | ORAL | Status: DC

## 2013-04-17 NOTE — Assessment & Plan Note (Signed)
Continue statin. Check lipids and liver. 

## 2013-04-17 NOTE — Assessment & Plan Note (Signed)
Continue ARB and beta blocker. Add Spironolactone 12.5 mg daily. Check potassium and renal function in one week.

## 2013-04-17 NOTE — Progress Notes (Signed)
HPI: FU CAD (s/p prior PCI of RCA and LAD), ICM, systolic CHF, VTach s/p ICD Rx 04/2010 (Sotalol added to regimen), [name], HTN, HL, CKD. LHC 7/06: EF 35-40%, mLAD 99%, dLAD 90%, mid to dist CFX 70%, RCA occluded. PCI: DES to mid and dist LAD. Carotids 4/12: 0-39% bilat ICA. Myovue 5/13 EF 25, septal, apical and inferior infarct; no ischemia. Admitted 3/14 with atrial flutter. He underwent TEE guided EPS + RFCA. Note his ejection fraction was 25-30% and there was moderate mitral regurgitation. There was moderate left atrial enlargement and mild right atrial enlargement. Subsequently readmitted for CHF. Had AKI felt to be due to combination of IV contrast, LV dysfunction and borderline hypotension. Of note, he did have a mildly elevated troponin. It was felt that this was likely related to his recent RFCA for atrial flutter. I last saw him in August of 2014. Since then, the patient denies any dyspnea on exertion, orthopnea, PND, pedal edema, palpitations, syncope or chest pain.    Current Outpatient Prescriptions  Medication Sig Dispense Refill  . aspirin 325 MG EC tablet Take 325 mg by mouth daily.      . carvedilol (COREG) 6.25 MG tablet Take 1 tablet (6.25 mg total) by mouth 2 (two) times daily with a meal.  180 tablet  3  . furosemide (LASIX) 20 MG tablet Take 1 tablet (20 mg total) by mouth daily. OK to take 1 extra tablet daily for weight gain 5 lbs.  90 tablet  3  . insulin NPH-insulin regular (NOVOLIN 70/30 RELION) (70-30) 100 UNIT/ML injection Inject 15 Units into the skin 2 (two) times daily with a meal.  10 mL  12  . losartan (COZAAR) 50 MG tablet Take 1 tablet (50 mg total) by mouth daily.  90 tablet  3  . lovastatin (MEVACOR) 20 MG tablet Take 2 tablets (40 mg total) by mouth at bedtime.  60 tablet  6  . magnesium oxide (MAG-OX) 400 MG tablet Take 1 tablet (400 mg total) by mouth daily.  90 tablet  3  . Multiple Vitamins-Minerals (MULTIVITAMIN PO) Take by mouth daily.      . sotalol  (BETAPACE) 120 MG tablet Take 1 tablet (120 mg total) by mouth 2 (two) times daily.  180 tablet  3   No current facility-administered medications for this visit.     Past Medical History  Diagnosis Date  . DM type 2 (diabetes mellitus, type 2)   . Ischemic cardiomyopathy   . HTN (hypertension)   . Hypercholesteremia     ablation  . Ventricular tachycardia   . CAD (coronary artery disease) 5361,4431 X 2     RCA-T, 70% PL (off CFX), 99% Prox LAD/90% Dist LAD, S/P TAXUS stent x 2  . ICD (implantable cardiac defibrillator) in place   . CKD (chronic kidney disease)   . Nephrolithiasis   . Atrial flutter 04/2012    s/p TEE-EPS+RFCA 04/2012  . Chronic anticoagulation   . Chronic systolic heart failure     Past Surgical History  Procedure Laterality Date  . Percutaneous coronary stent intervention (pci-s)  January 2002    PTCA/Stent Distal RCA  . Cardiac defibrillator placement  2007     Medtronic Maximo VR, serial number [id number]  . Percutaneous coronary stent intervention (pci-s)  June 2002    PTCA/Stent x 3 RCA, thrombolysis - failed  . Percutaneous coronary stent intervention (pci-s)  July 2006    TAXUS stents to prox and distal  LAD    History   Social History  . Marital Status: Married    Spouse Name: N/A    Number of Children: 3  . 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 Smoking status: Former Smoker    Quit date: 03/02/2000  . Smokeless tobacco: Not on file  . Alcohol Use: No  . Drug Use: No  . Sexual Activity: Not on file   Other Topics Concern  . Not on file   Social History Narrative   Pt lives with wife. He hunts regularly and is active, walking > 1 mile at times without difficulty.    ROS: no fevers or chills, productive cough, hemoptysis, dysphasia, odynophagia, melena, hematochezia, dysuria, hematuria, rash, seizure activity, orthopnea, PND, pedal edema, claudication.  Remaining systems are negative.  Physical Exam: Well-developed well-nourished in no acute distress.  Skin is warm and dry.  HEENT is normal.  Neck is supple.  Chest is clear to auscultation with normal expansion.  Cardiovascular exam is regular rate and rhythm.  Abdominal exam nontender or distended. No masses palpated. Extremities show no edema. neuro grossly intact  ECG sinus rhythm, first degree AV block, PVCs, anterior and inferior infarct. Nonspecific T-wave changes.

## 2013-04-17 NOTE — Assessment & Plan Note (Signed)
Continue aspirin and statin. 

## 2013-04-17 NOTE — Assessment & Plan Note (Signed)
Blood pressure controlled. Continue present medications. 

## 2013-04-17 NOTE — Assessment & Plan Note (Signed)
Status post ablation. 

## 2013-04-17 NOTE — Assessment & Plan Note (Signed)
Management per electrophysiology. 

## 2013-04-17 NOTE — Patient Instructions (Signed)
Your physician wants you to follow-up in: [place] will receive a reminder letter in the mail two months in advance. If you don't receive a letter, please call our office to schedule the follow-up appointment.   START SPIRONOLACTONE 12.5 MG (1/2 TABLET OF 25 MG TABLET) ONCE DAILY  Your physician recommends that you return for lab work in: [place] TO LAB WORK  Your physician has requested that you have an echocardiogram. Echocardiography is a painless test that uses sound waves to create images of your heart. It provides your doctor with information about the size and shape of your heart and how well your heart's chambers and valves are working. This procedure takes approximately one hour. There are no restrictions for this procedure.

## 2013-04-17 NOTE — Assessment & Plan Note (Signed)
Continue present dose of Lasix. 

## 2013-04-17 NOTE — Assessment & Plan Note (Signed)
Repeat echocardiogram. 

## 2013-04-17 NOTE — Assessment & Plan Note (Signed)
Continue sotalol

## 2013-04-19 ENCOUNTER — Ambulatory Visit (INDEPENDENT_AMBULATORY_CARE_PROVIDER_SITE_OTHER): Payer: 59 | Admitting: *Deleted

## 2013-04-19 ENCOUNTER — Telehealth: Payer: Self-pay | Admitting: Pharmacist

## 2013-04-19 ENCOUNTER — Encounter: Payer: Self-pay | Admitting: Internal Medicine

## 2013-04-19 DIAGNOSIS — I5020 Unspecified systolic (congestive) heart failure: Secondary | ICD-10-CM

## 2013-04-19 DIAGNOSIS — I4729 Other ventricular tachycardia: Secondary | ICD-10-CM

## 2013-04-19 DIAGNOSIS — I472 Ventricular tachycardia: Secondary | ICD-10-CM

## 2013-04-19 LAB — MDC_IDC_ENUM_SESS_TYPE_INCLINIC
Battery Voltage: 2.66 V
Brady Statistic RV Percent Paced: 0 %
HIGH POWER IMPEDANCE MEASURED VALUE: 50 Ohm
HIGH POWER IMPEDANCE MEASURED VALUE: 51 Ohm
HIGH POWER IMPEDANCE MEASURED VALUE: 51 Ohm
HIGH POWER IMPEDANCE MEASURED VALUE: 51 Ohm
HIGH POWER IMPEDANCE MEASURED VALUE: 51 Ohm
HIGH POWER IMPEDANCE MEASURED VALUE: 51 Ohm
HIGH POWER IMPEDANCE MEASURED VALUE: 52 Ohm
HIGH POWER IMPEDANCE MEASURED VALUE: 52 Ohm
HIGH POWER IMPEDANCE MEASURED VALUE: 52 Ohm
HIGH POWER IMPEDANCE MEASURED VALUE: 53 Ohm
HIGH POWER IMPEDANCE MEASURED VALUE: 65 Ohm
HIGH POWER IMPEDANCE MEASURED VALUE: 67 Ohm
HIGH POWER IMPEDANCE MEASURED VALUE: 67 Ohm
HIGH POWER IMPEDANCE MEASURED VALUE: 67 Ohm
HIGH POWER IMPEDANCE MEASURED VALUE: 67 Ohm
HIGH POWER IMPEDANCE MEASURED VALUE: 67 Ohm
HIGH POWER IMPEDANCE MEASURED VALUE: 68 Ohm
HIGH POWER IMPEDANCE MEASURED VALUE: 69 Ohm
HighPow Impedance: 49 Ohm
HighPow Impedance: 51 Ohm
HighPow Impedance: 51 Ohm
HighPow Impedance: 51 Ohm
HighPow Impedance: 51 Ohm
HighPow Impedance: 52 Ohm
HighPow Impedance: 65 Ohm
HighPow Impedance: 66 Ohm
HighPow Impedance: 67 Ohm
HighPow Impedance: 67 Ohm
HighPow Impedance: 67 Ohm
HighPow Impedance: 67 Ohm
HighPow Impedance: 69 Ohm
Lead Channel Impedance Value: 400 Ohm
Lead Channel Impedance Value: 400 Ohm
Lead Channel Impedance Value: 400 Ohm
Lead Channel Impedance Value: 408 Ohm
Lead Channel Impedance Value: 408 Ohm
Lead Channel Impedance Value: 408 Ohm
Lead Channel Impedance Value: 408 Ohm
Lead Channel Impedance Value: 408 Ohm
Lead Channel Impedance Value: 416 Ohm
Lead Channel Pacing Threshold Pulse Width: 0.1 ms
Lead Channel Sensing Intrinsic Amplitude: 5.4 mV
Lead Channel Sensing Intrinsic Amplitude: 5.7 mV
Lead Channel Sensing Intrinsic Amplitude: 5.9 mV
Lead Channel Sensing Intrinsic Amplitude: 6 mV
Lead Channel Sensing Intrinsic Amplitude: 6 mV
Lead Channel Sensing Intrinsic Amplitude: 6.7 mV
Lead Channel Sensing Intrinsic Amplitude: 6.7 mV
Lead Channel Sensing Intrinsic Amplitude: 7.2 mV
Lead Channel Setting Pacing Pulse Width: 0.4 ms
Lead Channel Setting Sensing Sensitivity: 0.3 mV
MDC IDC MSMT LEADCHNL RV IMPEDANCE VALUE: 400 Ohm
MDC IDC MSMT LEADCHNL RV IMPEDANCE VALUE: 400 Ohm
MDC IDC MSMT LEADCHNL RV IMPEDANCE VALUE: 400 Ohm
MDC IDC MSMT LEADCHNL RV IMPEDANCE VALUE: 408 Ohm
MDC IDC MSMT LEADCHNL RV IMPEDANCE VALUE: 408 Ohm
MDC IDC MSMT LEADCHNL RV IMPEDANCE VALUE: 416 Ohm
MDC IDC MSMT LEADCHNL RV PACING THRESHOLD AMPLITUDE: 2.5 V
MDC IDC MSMT LEADCHNL RV SENSING INTR AMPL: 5.2 mV
MDC IDC MSMT LEADCHNL RV SENSING INTR AMPL: 6 mV
MDC IDC MSMT LEADCHNL RV SENSING INTR AMPL: 6.1 mV
MDC IDC MSMT LEADCHNL RV SENSING INTR AMPL: 6.3 mV
MDC IDC MSMT LEADCHNL RV SENSING INTR AMPL: 6.4 mV
MDC IDC MSMT LEADCHNL RV SENSING INTR AMPL: 6.5 mV
MDC IDC MSMT LEADCHNL RV SENSING INTR AMPL: 7.1 mV
MDC IDC SESS DTM: 20150218162225
MDC IDC SET LEADCHNL RV PACING AMPLITUDE: 2.5 V
MDC IDC SET ZONE DETECTION INTERVAL: 240 ms
Zone Setting Detection Interval: 300 ms
Zone Setting Detection Interval: 380 ms

## 2013-04-19 NOTE — Telephone Encounter (Signed)
Aldosterone Receptor Antagonist Initiation Clinic Study - Pharmacist Baseline Follow-Up Patient was contacted via telephone today in efforts to discuss the following:  Spironolactone Order Date: 04/17/13 Last BMET: 06/03/12, K+ 4.1, SCr 1.0, CrCl ~ 95 ml/min   Medication Availability: yes   BMET labs were reviewed with patient   Patient was counseled on the benefits vs risks of spironolactone, including its indication, interactions, importance of adherence, and management of missed doses.   Understanding of regimen: good   Understanding of indications: good   Potential for compliance: good  Assessment & Plan:   Spoke to patient AND wife [name] [name]) today about new initiation of spironolactone. Patient started medication on order date (2/16). Educated on benefits and risks of hyperkalemia and/or gynecomastia. Patient aware and willing to return this Friday for follow-up labs. Requested that I call [phone] as the primary number. No issues or complaints at this time.  Next scheduled BMET: Friday April 21, 2013   [name] [name], PharmD Clinical Pharmacist - Resident Phone: [phone] Pager: [phone] 04/19/2013 7:02 PM

## 2013-04-21 ENCOUNTER — Other Ambulatory Visit (INDEPENDENT_AMBULATORY_CARE_PROVIDER_SITE_OTHER): Payer: 59

## 2013-04-21 DIAGNOSIS — I509 Heart failure, unspecified: Secondary | ICD-10-CM

## 2013-04-21 DIAGNOSIS — I5020 Unspecified systolic (congestive) heart failure: Secondary | ICD-10-CM

## 2013-04-21 LAB — BASIC METABOLIC PANEL
BUN: 20 mg/dL (ref 6–23)
CO2: 26 meq/L (ref 19–32)
CREATININE: 1.2 mg/dL (ref 0.4–1.5)
Calcium: 9.6 mg/dL (ref 8.4–10.5)
Chloride: 100 mEq/L (ref 96–112)
GFR: 82.57 mL/min (ref >60.00)
GLUCOSE: 384 mg/dL — AB (ref 70–99)
POTASSIUM: 5.3 meq/L — AB (ref 3.5–5.1)
Sodium: 133 mEq/L — ABNORMAL LOW (ref 135–145)

## 2013-04-21 NOTE — Progress Notes (Signed)
ICD check in clinic. Normal device function. Threshold and sensing consistent with previous device measurements. Impedance trends stable over time. SIC=0. 4 NST episodes---max dur. 6 bts, Max V 340 ms(176bpm). Histogram distribution appropriate for patient and level of activity. No changes made this session. Device programmed at appropriate safety margins. Device programmed to optimize intrinsic conduction. Batt voltage 2.66V (ERI 2.62V).  Plan to check device with GT on 6-3----patient advised to call if he hears alert tones prior to this. Patient education completed including shock plan. Alert tones demonstrated for patient.

## 2013-04-24 ENCOUNTER — Telehealth: Payer: Self-pay | Admitting: Pharmacist

## 2013-04-24 DIAGNOSIS — I5022 Chronic systolic (congestive) heart failure: Secondary | ICD-10-CM

## 2013-04-24 NOTE — Telephone Encounter (Signed)
Aldosterone Receptor Antagonist Initiation Clinic Study - Pharmacist 3-5 Day Follow-Up Patient was contacted via telephone today in efforts to discuss the following:  Spironolactone Order Date: 04/17/13 Last BMET: 04/21/13, K+ 5.3, SCr 1.2    Medication Availability: yes   BMET labs were reviewed with patient    Reinforced benefits vs risks of spironolactone, including its indication, interactions, importance of adherence, and management of missed doses.    Does the patient  feel that his/her medications are working for him/her?  yes   Has the patient been experiencing any side effects to the medications prescribed?  Yes, complains of impotence   Understanding of regimen: good   Understanding of indications: good   Potential for compliance: good  Assessment & Plan:   Upon 3-5 day lab results from 02/20, K+ increased from 4.1 to 5.3 with no supplemental potassium; under normal circumstances, would probably suggest reducing dose to 12.5mg  every other day. However, patient also complained of "decreased man effects," which he further explained as erectile dysfunction. I have instructed patient to discontinue medication and return for follow-up labs with his ECHO this upcoming Friday.   Next scheduled BMET: Friday April 28, 2013  [name] [name] B. Leitha Schuller, PharmD Clinical Pharmacist - Resident Phone: [phone] Pager: [phone] 04/24/2013 5:26 PM

## 2013-04-28 ENCOUNTER — Other Ambulatory Visit (HOSPITAL_COMMUNITY): Payer: 59

## 2013-04-28 ENCOUNTER — Other Ambulatory Visit: Payer: 59

## 2013-04-30 DIAGNOSIS — I255 Ischemic cardiomyopathy: Secondary | ICD-10-CM

## 2013-04-30 HISTORY — DX: Ischemic cardiomyopathy: I25.5

## 2013-05-05 ENCOUNTER — Ambulatory Visit (HOSPITAL_COMMUNITY): Payer: 59 | Attending: Cardiology | Admitting: Cardiology

## 2013-05-05 ENCOUNTER — Other Ambulatory Visit (INDEPENDENT_AMBULATORY_CARE_PROVIDER_SITE_OTHER): Payer: 59

## 2013-05-05 DIAGNOSIS — I059 Rheumatic mitral valve disease, unspecified: Secondary | ICD-10-CM | POA: Insufficient documentation

## 2013-05-05 DIAGNOSIS — Z95810 Presence of automatic (implantable) cardiac defibrillator: Secondary | ICD-10-CM

## 2013-05-05 DIAGNOSIS — I2589 Other forms of chronic ischemic heart disease: Secondary | ICD-10-CM

## 2013-05-05 DIAGNOSIS — I340 Nonrheumatic mitral (valve) insufficiency: Secondary | ICD-10-CM

## 2013-05-05 DIAGNOSIS — I079 Rheumatic tricuspid valve disease, unspecified: Secondary | ICD-10-CM | POA: Insufficient documentation

## 2013-05-05 DIAGNOSIS — I4892 Unspecified atrial flutter: Secondary | ICD-10-CM

## 2013-05-05 LAB — BASIC METABOLIC PANEL
BUN: 22 mg/dL (ref 6–23)
CO2: 26 meq/L (ref 19–32)
CREATININE: 1.1 mg/dL (ref 0.4–1.5)
Calcium: 9.4 mg/dL (ref 8.4–10.5)
Chloride: 102 mEq/L (ref 96–112)
GFR: 91.7 mL/min (ref >60.00)
GLUCOSE: 108 mg/dL — AB (ref 70–99)
Potassium: 4.4 mEq/L (ref 3.5–5.1)
SODIUM: 138 meq/L (ref 135–145)

## 2013-05-05 LAB — LIPID PANEL
CHOL/HDL RATIO: 5
Cholesterol: 171 mg/dL (ref 0–200)
HDL: 37.2 mg/dL — AB (ref >39.00)
LDL Cholesterol: 121 mg/dL — ABNORMAL HIGH (ref 0–99)
Triglycerides: 62 mg/dL (ref 0.0–149.0)
VLDL: 12.4 mg/dL (ref 0.0–40.0)

## 2013-05-05 LAB — HEPATIC FUNCTION PANEL
ALT: 17 U/L (ref 0–53)
AST: 22 U/L (ref 0–37)
Albumin: 3.4 g/dL — ABNORMAL LOW (ref 3.5–5.2)
Alkaline Phosphatase: 76 U/L (ref 39–117)
BILIRUBIN DIRECT: 0.1 mg/dL (ref 0.0–0.3)
Total Bilirubin: 0.6 mg/dL (ref 0.3–1.2)
Total Protein: 7.5 g/dL (ref 6.0–8.3)

## 2013-05-05 NOTE — Progress Notes (Signed)
Echo done.

## 2013-05-08 ENCOUNTER — Telehealth: Payer: Self-pay | Admitting: Cardiology

## 2013-05-08 DIAGNOSIS — E7800 Pure hypercholesterolemia, unspecified: Secondary | ICD-10-CM

## 2013-05-08 MED ORDER — ATORVASTATIN CALCIUM 80 MG PO TABS: 80.0000 mg | ORAL_TABLET | Freq: Every day | ORAL | Status: DC

## 2013-05-08 NOTE — Telephone Encounter (Signed)
Spoke with pt, aware of echo and lab results.

## 2013-05-08 NOTE — Telephone Encounter (Signed)
Follow Up   Pt called back for results//SR

## 2013-05-30 ENCOUNTER — Other Ambulatory Visit: Payer: Self-pay | Admitting: Internal Medicine

## 2013-08-02 ENCOUNTER — Ambulatory Visit (INDEPENDENT_AMBULATORY_CARE_PROVIDER_SITE_OTHER): Payer: 59 | Admitting: Internal Medicine

## 2013-08-02 ENCOUNTER — Encounter: Payer: Self-pay | Admitting: Internal Medicine

## 2013-08-02 VITALS — BP 135/89 | HR 65 | Ht 70.0 in | Wt 240.0 lb

## 2013-08-02 DIAGNOSIS — E7800 Pure hypercholesterolemia, unspecified: Secondary | ICD-10-CM

## 2013-08-02 DIAGNOSIS — I472 Ventricular tachycardia, unspecified: Secondary | ICD-10-CM

## 2013-08-02 DIAGNOSIS — I2589 Other forms of chronic ischemic heart disease: Secondary | ICD-10-CM

## 2013-08-02 DIAGNOSIS — I4729 Other ventricular tachycardia: Secondary | ICD-10-CM

## 2013-08-02 DIAGNOSIS — I519 Heart disease, unspecified: Secondary | ICD-10-CM

## 2013-08-02 DIAGNOSIS — Z95810 Presence of automatic (implantable) cardiac defibrillator: Secondary | ICD-10-CM

## 2013-08-02 DIAGNOSIS — I4892 Unspecified atrial flutter: Secondary | ICD-10-CM

## 2013-08-02 LAB — MDC_IDC_ENUM_SESS_TYPE_INCLINIC
Battery Voltage: 2.64 V
Brady Statistic RV Percent Paced: 0.1 %
Lead Channel Pacing Threshold Amplitude: 1 V
Lead Channel Pacing Threshold Pulse Width: 0.4 ms
Lead Channel Sensing Intrinsic Amplitude: 5.9 mV
Lead Channel Setting Pacing Amplitude: 2.5 V
Lead Channel Setting Pacing Pulse Width: 0.4 ms
Lead Channel Setting Sensing Sensitivity: 0.3 mV
MDC IDC MSMT LEADCHNL RV IMPEDANCE VALUE: 400 Ohm
MDC IDC SET ZONE DETECTION INTERVAL: 380 ms
Zone Setting Detection Interval: 240 ms
Zone Setting Detection Interval: 300 ms

## 2013-08-02 NOTE — Patient Instructions (Signed)
Your physician recommends that you schedule a follow-up appointment in: 4 weeks device clinic and 3 months with Dr [name]  Your physician has requested that you have an echocardiogram. Echocardiography is a painless test that uses sound waves to create images of your heart. It provides your doctor with information about the size and shape of your heart and how well your heart's chambers and valves are working. This procedure takes approximately one hour. There are no restrictions for this procedure.---in 4 weeks same day as device check  Get the labs Dr [name] has ordered same day---need to come fasting to appointment

## 2013-08-02 NOTE — Progress Notes (Signed)
PCP: Tamsen Roers, MD Primary Cardiologist: [name] Primary EP: Ata Pecha is a 63 y.o. male who presents today for routine electrophysiology followup.  Since last being seen in our clinic, the patient reports doing very well.    Today, he denies symptoms of palpitations, chest pain, shortness of breath,  lower extremity edema, dizziness, presyncope, syncope, or ICD shocks.  The patient is otherwise without complaint today.   Past Medical History  Diagnosis Date  . DM type 2 (diabetes mellitus, type 2)   . Ischemic cardiomyopathy   . HTN (hypertension)   . Hypercholesteremia     ablation  . Ventricular tachycardia   . CAD (coronary artery disease) 5009,3818 X 2     RCA-T, 70% PL (off CFX), 99% Prox LAD/90% Dist LAD, S/P TAXUS stent x 2  . ICD (implantable cardiac defibrillator) in place   . CKD (chronic kidney disease)   . Nephrolithiasis   . Atrial flutter 04/2012    s/p TEE-EPS+RFCA 04/2012  . Chronic anticoagulation   . Chronic systolic heart failure    Past Surgical History  Procedure Laterality Date  . Percutaneous coronary stent intervention (pci-s)  January 2002    PTCA/Stent Distal RCA  . Cardiac defibrillator placement  2007     Medtronic Maximo VR, serial number [id number]  . Percutaneous coronary stent intervention (pci-s)  June 2002    PTCA/Stent x 3 RCA, thrombolysis - failed  . Percutaneous coronary stent intervention (pci-s)  July 2006    TAXUS stents to prox and distal LAD    Current Outpatient Prescriptions  Medication Sig Dispense Refill  . aspirin 325 MG EC tablet Take 325 mg by mouth daily.      Marland Kitchen atorvastatin (LIPITOR) 80 MG tablet Take 1 tablet (80 mg total) by mouth daily.  90 tablet  3  . carvedilol (COREG) 6.25 MG tablet Take 1 tablet (6.25 mg total) by mouth 2 (two) times daily with a meal.  180 tablet  3  . furosemide (LASIX) 20 MG tablet TAKE ONE TABLET BY MOUTH ONCE DAILY.  OKAY TO TAKE 1 EXTRA TABLET DAILY FOR WEIGHT GAIN 5  POUNDS  90 tablet  0  . insulin NPH-insulin regular (NOVOLIN 70/30 RELION) (70-30) 100 UNIT/ML injection Inject 15 Units into the skin 2 (two) times daily with a meal.  10 mL  12  . losartan (COZAAR) 50 MG tablet Take 1 tablet (50 mg total) by mouth daily.  90 tablet  3  . magnesium oxide (MAG-OX) 400 MG tablet Take 1 tablet (400 mg total) by mouth daily.  90 tablet  3  . Multiple Vitamins-Minerals (MULTIVITAMIN PO) Take 1 capsule by mouth daily.       . sotalol (BETAPACE) 120 MG tablet Take 1 tablet (120 mg total) by mouth 2 (two) times daily.  180 tablet  3   No current facility-administered medications for this visit.    Physical Exam: Filed Vitals:   08/02/13 0918  BP: 135/89  Pulse: 65  Height: 5\' 10"  (1.778 m)  Weight: 240 lb (108.863 kg)    GEN- The patient is well appearing, alert and oriented x 3 today.   Head- normocephalic, atraumatic Eyes-  Sclera clear, conjunctiva pink Ears- hearing intact Oropharynx- clear Lungs- Clear to ausculation bilaterally, normal work of breathing Chest- ICD pocket is well healed Heart- Regular rate and rhythm, no murmurs, rubs or gallops, PMI not laterally displaced GI- soft, NT, ND, + BS Extremities- no clubbing, cyanosis, or edema  ICD  interrogation- reviewed in detail today,  See PACEART report  EKG today reveals sinus rhythm 65 bpm, PR 184, QTc 438, anterior infarction  Assessment and Plan:  1.  Chronic systolic dysfunction euvolemic today Stable on an appropriate medical regimen Normal ICD function See Pace Art report No changes today  He is approaching ERI Alert tone demonstrated today Will return for device check and an echo in 1 month Once ERI, he can be set up for ICD generator change with Dr [name] without requiring additional office visit I will arrange follow-up with Dr [name] in 3 months  2. HL Repeat fasting lipids, LFTs  3. Atrial flutter Resolved s/p ablation  4. VT Doing well with sotalol Qt is  stable

## 2013-08-09 ENCOUNTER — Encounter: Payer: Self-pay | Admitting: Internal Medicine

## 2013-08-30 ENCOUNTER — Other Ambulatory Visit: Payer: 59

## 2013-08-30 ENCOUNTER — Other Ambulatory Visit (HOSPITAL_COMMUNITY): Payer: 59

## 2013-09-13 ENCOUNTER — Other Ambulatory Visit (INDEPENDENT_AMBULATORY_CARE_PROVIDER_SITE_OTHER): Payer: 59

## 2013-09-13 ENCOUNTER — Ambulatory Visit (INDEPENDENT_AMBULATORY_CARE_PROVIDER_SITE_OTHER): Payer: 59 | Admitting: *Deleted

## 2013-09-13 DIAGNOSIS — I4892 Unspecified atrial flutter: Secondary | ICD-10-CM

## 2013-09-13 DIAGNOSIS — E7800 Pure hypercholesterolemia, unspecified: Secondary | ICD-10-CM

## 2013-09-13 DIAGNOSIS — I472 Ventricular tachycardia, unspecified: Secondary | ICD-10-CM

## 2013-09-13 DIAGNOSIS — I4729 Other ventricular tachycardia: Secondary | ICD-10-CM

## 2013-09-13 DIAGNOSIS — I519 Heart disease, unspecified: Secondary | ICD-10-CM

## 2013-09-13 LAB — MDC_IDC_ENUM_SESS_TYPE_INCLINIC
Brady Statistic RV Percent Paced: 0 %
HIGH POWER IMPEDANCE MEASURED VALUE: 49 Ohm
HIGH POWER IMPEDANCE MEASURED VALUE: 49 Ohm
HIGH POWER IMPEDANCE MEASURED VALUE: 49 Ohm
HIGH POWER IMPEDANCE MEASURED VALUE: 49 Ohm
HIGH POWER IMPEDANCE MEASURED VALUE: 50 Ohm
HIGH POWER IMPEDANCE MEASURED VALUE: 50 Ohm
HIGH POWER IMPEDANCE MEASURED VALUE: 51 Ohm
HIGH POWER IMPEDANCE MEASURED VALUE: 62 Ohm
HIGH POWER IMPEDANCE MEASURED VALUE: 62 Ohm
HIGH POWER IMPEDANCE MEASURED VALUE: 65 Ohm
HIGH POWER IMPEDANCE MEASURED VALUE: 66 Ohm
HIGH POWER IMPEDANCE MEASURED VALUE: 66 Ohm
HIGH POWER IMPEDANCE MEASURED VALUE: 68 Ohm
HighPow Impedance: 48 Ohm
HighPow Impedance: 48 Ohm
HighPow Impedance: 48 Ohm
HighPow Impedance: 49 Ohm
HighPow Impedance: 49 Ohm
HighPow Impedance: 50 Ohm
HighPow Impedance: 50 Ohm
HighPow Impedance: 51 Ohm
HighPow Impedance: 51 Ohm
HighPow Impedance: 61 Ohm
HighPow Impedance: 62 Ohm
HighPow Impedance: 64 Ohm
HighPow Impedance: 64 Ohm
HighPow Impedance: 64 Ohm
HighPow Impedance: 65 Ohm
HighPow Impedance: 65 Ohm
HighPow Impedance: 67 Ohm
HighPow Impedance: 68 Ohm
Lead Channel Impedance Value: 384 Ohm
Lead Channel Impedance Value: 392 Ohm
Lead Channel Impedance Value: 400 Ohm
Lead Channel Impedance Value: 400 Ohm
Lead Channel Impedance Value: 400 Ohm
Lead Channel Impedance Value: 408 Ohm
Lead Channel Impedance Value: 416 Ohm
Lead Channel Impedance Value: 416 Ohm
Lead Channel Pacing Threshold Amplitude: 1 V
Lead Channel Pacing Threshold Pulse Width: 3 ms
Lead Channel Sensing Intrinsic Amplitude: 6 mV
Lead Channel Sensing Intrinsic Amplitude: 6.2 mV
Lead Channel Sensing Intrinsic Amplitude: 6.2 mV
Lead Channel Sensing Intrinsic Amplitude: 6.3 mV
Lead Channel Sensing Intrinsic Amplitude: 6.3 mV
Lead Channel Sensing Intrinsic Amplitude: 6.7 mV
Lead Channel Sensing Intrinsic Amplitude: 7.2 mV
MDC IDC MSMT BATTERY VOLTAGE: 2.64 V
MDC IDC MSMT LEADCHNL RV IMPEDANCE VALUE: 400 Ohm
MDC IDC MSMT LEADCHNL RV IMPEDANCE VALUE: 400 Ohm
MDC IDC MSMT LEADCHNL RV IMPEDANCE VALUE: 400 Ohm
MDC IDC MSMT LEADCHNL RV IMPEDANCE VALUE: 400 Ohm
MDC IDC MSMT LEADCHNL RV IMPEDANCE VALUE: 400 Ohm
MDC IDC MSMT LEADCHNL RV IMPEDANCE VALUE: 400 Ohm
MDC IDC MSMT LEADCHNL RV IMPEDANCE VALUE: 416 Ohm
MDC IDC MSMT LEADCHNL RV SENSING INTR AMPL: 5.5 mV
MDC IDC MSMT LEADCHNL RV SENSING INTR AMPL: 5.9 mV
MDC IDC MSMT LEADCHNL RV SENSING INTR AMPL: 5.9 mV
MDC IDC MSMT LEADCHNL RV SENSING INTR AMPL: 6.2 mV
MDC IDC MSMT LEADCHNL RV SENSING INTR AMPL: 6.7 mV
MDC IDC MSMT LEADCHNL RV SENSING INTR AMPL: 6.9 mV
MDC IDC MSMT LEADCHNL RV SENSING INTR AMPL: 6.9 mV
MDC IDC MSMT LEADCHNL RV SENSING INTR AMPL: 6.9 mV
MDC IDC SESS DTM: 20150715092903
MDC IDC SET LEADCHNL RV PACING AMPLITUDE: 2.5 V
MDC IDC SET LEADCHNL RV PACING PULSEWIDTH: 0.4 ms
MDC IDC SET LEADCHNL RV SENSING SENSITIVITY: 0.3 mV
Zone Setting Detection Interval: 240 ms
Zone Setting Detection Interval: 300 ms
Zone Setting Detection Interval: 380 ms

## 2013-09-13 LAB — BASIC METABOLIC PANEL
BUN: 20 mg/dL (ref 6–23)
CHLORIDE: 101 meq/L (ref 96–112)
CO2: 25 meq/L (ref 19–32)
Calcium: 9.5 mg/dL (ref 8.4–10.5)
Creatinine, Ser: 1 mg/dL (ref 0.4–1.5)
GFR: 94.71 mL/min (ref >60.00)
Glucose, Bld: 336 mg/dL — ABNORMAL HIGH (ref 70–99)
Potassium: 5.2 mEq/L — ABNORMAL HIGH (ref 3.5–5.1)
Sodium: 134 mEq/L — ABNORMAL LOW (ref 135–145)

## 2013-09-13 LAB — LIPID PANEL
CHOL/HDL RATIO: 4
CHOLESTEROL: 146 mg/dL (ref 0–200)
HDL: 38 mg/dL — AB (ref >39.00)
LDL Cholesterol: 94 mg/dL (ref 0–99)
NonHDL: 108
Triglycerides: 71 mg/dL (ref 0.0–149.0)
VLDL: 14.2 mg/dL (ref 0.0–40.0)

## 2013-09-13 LAB — CK: Total CK: 83 U/L (ref 7–232)

## 2013-09-13 LAB — HEPATIC FUNCTION PANEL
ALK PHOS: 74 U/L (ref 39–117)
ALT: 22 U/L (ref 0–53)
AST: 23 U/L (ref 0–37)
Albumin: 3.3 g/dL — ABNORMAL LOW (ref 3.5–5.2)
Bilirubin, Direct: 0.1 mg/dL (ref 0.0–0.3)
TOTAL PROTEIN: 7.4 g/dL (ref 6.0–8.3)
Total Bilirubin: 0.9 mg/dL (ref 0.2–1.2)

## 2013-09-13 NOTE — Progress Notes (Signed)
ICD check in clinic. Normal device function. Thresholds and sensing consistent with previous device measurements. Impedance trends stable over time. 2 NSVT episodes no EGM's available.    Histogram distribution appropriate for patient and level of activity. No changes made this session. Device programmed at appropriate safety margins. Device programmed to optimize intrinsic conduction.  Plan to check device every 3 months remotely and in office annually. Patient education completed including shock plan. Alert tones/vibration demonstrated for patient.  ROV in September with Dr. [name].

## 2013-09-14 ENCOUNTER — Encounter: Payer: Self-pay | Admitting: *Deleted

## 2013-10-05 ENCOUNTER — Other Ambulatory Visit: Payer: Self-pay | Admitting: Cardiology

## 2013-10-05 ENCOUNTER — Other Ambulatory Visit: Payer: Self-pay | Admitting: Internal Medicine

## 2013-10-13 ENCOUNTER — Encounter: Payer: Self-pay | Admitting: Internal Medicine

## 2013-11-02 ENCOUNTER — Encounter: Payer: Self-pay | Admitting: Internal Medicine

## 2013-11-02 ENCOUNTER — Ambulatory Visit (INDEPENDENT_AMBULATORY_CARE_PROVIDER_SITE_OTHER): Payer: 59 | Admitting: Internal Medicine

## 2013-11-02 VITALS — BP 118/82 | HR 68 | Ht 70.0 in | Wt 233.0 lb

## 2013-11-02 DIAGNOSIS — I519 Heart disease, unspecified: Secondary | ICD-10-CM

## 2013-11-02 DIAGNOSIS — Z95810 Presence of automatic (implantable) cardiac defibrillator: Secondary | ICD-10-CM

## 2013-11-02 DIAGNOSIS — I2589 Other forms of chronic ischemic heart disease: Secondary | ICD-10-CM

## 2013-11-02 LAB — MDC_IDC_ENUM_SESS_TYPE_INCLINIC
Battery Voltage: 2.64 V
Brady Statistic RV Percent Paced: 0 %
HIGH POWER IMPEDANCE MEASURED VALUE: 47 Ohm
HIGH POWER IMPEDANCE MEASURED VALUE: 47 Ohm
HIGH POWER IMPEDANCE MEASURED VALUE: 48 Ohm
HIGH POWER IMPEDANCE MEASURED VALUE: 49 Ohm
HIGH POWER IMPEDANCE MEASURED VALUE: 49 Ohm
HIGH POWER IMPEDANCE MEASURED VALUE: 49 Ohm
HIGH POWER IMPEDANCE MEASURED VALUE: 49 Ohm
HIGH POWER IMPEDANCE MEASURED VALUE: 59 Ohm
HIGH POWER IMPEDANCE MEASURED VALUE: 60 Ohm
HIGH POWER IMPEDANCE MEASURED VALUE: 61 Ohm
HIGH POWER IMPEDANCE MEASURED VALUE: 61 Ohm
HIGH POWER IMPEDANCE MEASURED VALUE: 61 Ohm
HIGH POWER IMPEDANCE MEASURED VALUE: 63 Ohm
HighPow Impedance: 46 Ohm
HighPow Impedance: 47 Ohm
HighPow Impedance: 47 Ohm
HighPow Impedance: 47 Ohm
HighPow Impedance: 47 Ohm
HighPow Impedance: 47 Ohm
HighPow Impedance: 48 Ohm
HighPow Impedance: 48 Ohm
HighPow Impedance: 49 Ohm
HighPow Impedance: 59 Ohm
HighPow Impedance: 60 Ohm
HighPow Impedance: 60 Ohm
HighPow Impedance: 61 Ohm
HighPow Impedance: 61 Ohm
HighPow Impedance: 62 Ohm
HighPow Impedance: 63 Ohm
HighPow Impedance: 63 Ohm
HighPow Impedance: 64 Ohm
Lead Channel Impedance Value: 384 Ohm
Lead Channel Impedance Value: 392 Ohm
Lead Channel Impedance Value: 400 Ohm
Lead Channel Impedance Value: 400 Ohm
Lead Channel Impedance Value: 400 Ohm
Lead Channel Impedance Value: 400 Ohm
Lead Channel Impedance Value: 400 Ohm
Lead Channel Impedance Value: 400 Ohm
Lead Channel Impedance Value: 400 Ohm
Lead Channel Impedance Value: 408 Ohm
Lead Channel Impedance Value: 408 Ohm
Lead Channel Pacing Threshold Amplitude: 1 V
Lead Channel Pacing Threshold Pulse Width: 0.3 ms
Lead Channel Sensing Intrinsic Amplitude: 6.1 mV
Lead Channel Sensing Intrinsic Amplitude: 6.9 mV
Lead Channel Sensing Intrinsic Amplitude: 7.4 mV
Lead Channel Sensing Intrinsic Amplitude: 7.5 mV
Lead Channel Sensing Intrinsic Amplitude: 7.7 mV
Lead Channel Sensing Intrinsic Amplitude: 8.1 mV
Lead Channel Sensing Intrinsic Amplitude: 8.2 mV
Lead Channel Sensing Intrinsic Amplitude: 8.3 mV
Lead Channel Sensing Intrinsic Amplitude: 9 mV
Lead Channel Setting Pacing Amplitude: 2.5 V
Lead Channel Setting Sensing Sensitivity: 0.3 mV
MDC IDC MSMT LEADCHNL RV IMPEDANCE VALUE: 392 Ohm
MDC IDC MSMT LEADCHNL RV IMPEDANCE VALUE: 392 Ohm
MDC IDC MSMT LEADCHNL RV IMPEDANCE VALUE: 392 Ohm
MDC IDC MSMT LEADCHNL RV IMPEDANCE VALUE: 408 Ohm
MDC IDC MSMT LEADCHNL RV SENSING INTR AMPL: 6 mV
MDC IDC MSMT LEADCHNL RV SENSING INTR AMPL: 6.3 mV
MDC IDC MSMT LEADCHNL RV SENSING INTR AMPL: 7.2 mV
MDC IDC MSMT LEADCHNL RV SENSING INTR AMPL: 8.1 mV
MDC IDC MSMT LEADCHNL RV SENSING INTR AMPL: 8.1 mV
MDC IDC MSMT LEADCHNL RV SENSING INTR AMPL: 8.2 mV
MDC IDC SESS DTM: 20150903092525
MDC IDC SET LEADCHNL RV PACING PULSEWIDTH: 0.4 ms
MDC IDC SET ZONE DETECTION INTERVAL: 240 ms
MDC IDC SET ZONE DETECTION INTERVAL: 300 ms
Zone Setting Detection Interval: 380 ms

## 2013-11-02 NOTE — Progress Notes (Signed)
HPI [name] returns today for followup. He is a pleasant 63 yo man with an ICM, chronic class 2 CHF, and VT. He has no ICD shocks in over a year while on sotalol. He has done well and denies chest pain or sob. No syncope. No ICD shock. Allergies  Allergen Reactions  . Penicillins Other (See Comments)    Unknown reaction     Current Outpatient Prescriptions  Medication Sig Dispense Refill  . aspirin 325 MG EC tablet Take 325 mg by mouth daily.      Marland Kitchen atorvastatin (LIPITOR) 80 MG tablet Take 1 tablet (80 mg total) by mouth daily.  90 tablet  3  . carvedilol (COREG) 6.25 MG tablet TAKE ONE TABLET BY MOUTH TWICE DAILY WITH A MEAL  180 tablet  0  . furosemide (LASIX) 20 MG tablet TAKE ONE TABLET BY MOUTH ONCE DAILY.  OKAY TO TAKE 1 EXTRA TABLET DAILY FOR WEIGHT GAIN 5 POUNDS  90 tablet  0  . insulin NPH-insulin regular (NOVOLIN 70/30 RELION) (70-30) 100 UNIT/ML injection Inject 15 Units into the skin 2 (two) times daily with a meal.  10 mL  12  . losartan (COZAAR) 50 MG tablet Take 1 tablet (50 mg total) by mouth daily.  90 tablet  3  . magnesium oxide (MAG-OX) 400 MG tablet Take 1 tablet (400 mg total) by mouth daily.  90 tablet  3  . Multiple Vitamins-Minerals (MULTIVITAMIN PO) Take 1 capsule by mouth daily.       . sotalol (BETAPACE) 120 MG tablet TAKE ONE TABLET BY MOUTH TWICE DAILY  180 tablet  0   No current facility-administered medications for this visit.     Past Medical History  Diagnosis Date  . DM type 2 (diabetes mellitus, type 2)   . Ischemic cardiomyopathy   . HTN (hypertension)   . Hypercholesteremia     ablation  . Ventricular tachycardia   . CAD (coronary artery disease) 2800,3491 X 2     RCA-T, 70% PL (off CFX), 99% Prox LAD/90% Dist LAD, S/P TAXUS stent x 2  . ICD (implantable cardiac defibrillator) in place   . CKD (chronic kidney disease)   . Nephrolithiasis   . Atrial flutter 04/2012    s/p TEE-EPS+RFCA 04/2012  . Chronic anticoagulation   .  Chronic systolic heart failure     ROS:   All systems reviewed and negative except as noted in the HPI.   Past Surgical History  Procedure Laterality Date  . Percutaneous coronary stent intervention (pci-s)  January 2002    PTCA/Stent Distal RCA  . Cardiac defibrillator placement  2007     Medtronic Maximo VR, serial number [id number]  . Percutaneous coronary stent intervention (pci-s)  June 2002    PTCA/Stent x 3 RCA, thrombolysis - failed  . Percutaneous coronary stent intervention (pci-s)  July 2006    TAXUS stents to prox and distal LAD     History reviewed. No pertinent family history.   History   Social History  . Marital Status: Married    Spouse Name: N/A    Number of Children: 3  . 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 Smoking status: Former Smoker    Quit date: 03/02/2000  . Smokeless tobacco: Not on file  . Alcohol Use: No  . Drug Use: No  . Sexual Activity: Not on file  Other Topics Concern  . Not on file   Social History Narrative   Pt lives with wife. He hunts regularly and is active, walking > 1 mile at times without difficulty.     BP 118/82  Pulse 68  Ht 5\' 10"  (1.778 m)  Wt 233 lb (105.688 kg)  BMI 33.43 kg/m2  Physical Exam:  Well appearing 63 yo man, NAD HEENT: Unremarkable Neck:  No JVD, no thyromegally Back:  No CVA tenderness Lungs:  Clear with no wheezes HEART:  Regular rate rhythm, no murmurs, no rubs, no clicks Abd:  soft, positive bowel sounds, no organomegally, no rebound, no guarding Ext:  2 plus pulses, no edema, no cyanosis, no clubbing Skin:  No rashes no nodules Neuro:  CN II through XII intact, motor grossly intact   DEVICE  Normal device function.  See PaceArt for details.   Assess/Plan:

## 2013-11-02 NOTE — Patient Instructions (Signed)
Your physician wants you to follow-up in: 1 year with Dr. [name].   You will receive a reminder letter in the mail two months in advance. If you don't receive a letter, please call our office to schedule the follow-up appointment. Your physician recommends that you schedule a follow-up appointment in: 2 months with device clinic for a battery check.  Your physician recommends that you continue on your current medications as directed. Please refer to the Current Medication list given to you today.

## 2013-11-08 ENCOUNTER — Encounter: Payer: Self-pay | Admitting: Internal Medicine

## 2013-11-23 ENCOUNTER — Other Ambulatory Visit: Payer: Self-pay | Admitting: *Deleted

## 2013-11-23 DIAGNOSIS — E7800 Pure hypercholesterolemia, unspecified: Secondary | ICD-10-CM

## 2013-11-23 MED ORDER — LOSARTAN POTASSIUM 50 MG PO TABS: 50.0000 mg | ORAL_TABLET | Freq: Every day | ORAL | Status: DC

## 2013-11-29 ENCOUNTER — Other Ambulatory Visit: Payer: Self-pay

## 2013-11-29 MED ORDER — FUROSEMIDE 20 MG PO TABS: ORAL_TABLET | ORAL | Status: DC

## 2013-12-15 ENCOUNTER — Other Ambulatory Visit: Payer: Self-pay

## 2013-12-16 ENCOUNTER — Emergency Department (HOSPITAL_COMMUNITY): Payer: 59

## 2013-12-16 ENCOUNTER — Encounter (HOSPITAL_COMMUNITY): Payer: Self-pay | Admitting: Emergency Medicine

## 2013-12-16 ENCOUNTER — Inpatient Hospital Stay (HOSPITAL_COMMUNITY)
Admission: EM | Admit: 2013-12-16 | Discharge: 2013-12-22 | DRG: 292 | Disposition: A | Discharge status: Home / Self Care | Payer: 59 | Attending: Cardiology | Admitting: Cardiology

## 2013-12-16 DIAGNOSIS — Z880 Allergy status to penicillin: Secondary | ICD-10-CM

## 2013-12-16 DIAGNOSIS — E119 Type 2 diabetes mellitus without complications: Secondary | ICD-10-CM | POA: Diagnosis present

## 2013-12-16 DIAGNOSIS — R600 Localized edema: Secondary | ICD-10-CM

## 2013-12-16 DIAGNOSIS — E780 Pure hypercholesterolemia: Secondary | ICD-10-CM | POA: Diagnosis present

## 2013-12-16 DIAGNOSIS — E785 Hyperlipidemia, unspecified: Secondary | ICD-10-CM | POA: Diagnosis present

## 2013-12-16 DIAGNOSIS — I10 Essential (primary) hypertension: Secondary | ICD-10-CM

## 2013-12-16 DIAGNOSIS — Z95810 Presence of automatic (implantable) cardiac defibrillator: Secondary | ICD-10-CM

## 2013-12-16 DIAGNOSIS — I483 Typical atrial flutter: Secondary | ICD-10-CM

## 2013-12-16 DIAGNOSIS — G479 Sleep disorder, unspecified: Secondary | ICD-10-CM | POA: Diagnosis present

## 2013-12-16 DIAGNOSIS — R0602 Shortness of breath: Secondary | ICD-10-CM | POA: Diagnosis not present

## 2013-12-16 DIAGNOSIS — I340 Nonrheumatic mitral (valve) insufficiency: Secondary | ICD-10-CM

## 2013-12-16 DIAGNOSIS — Z794 Long term (current) use of insulin: Secondary | ICD-10-CM

## 2013-12-16 DIAGNOSIS — Z87891 Personal history of nicotine dependence: Secondary | ICD-10-CM

## 2013-12-16 DIAGNOSIS — I472 Ventricular tachycardia: Secondary | ICD-10-CM | POA: Diagnosis present

## 2013-12-16 DIAGNOSIS — T462X5A Adverse effect of other antidysrhythmic drugs, initial encounter: Secondary | ICD-10-CM | POA: Diagnosis present

## 2013-12-16 DIAGNOSIS — I509 Heart failure, unspecified: Secondary | ICD-10-CM

## 2013-12-16 DIAGNOSIS — R609 Edema, unspecified: Secondary | ICD-10-CM

## 2013-12-16 DIAGNOSIS — Z955 Presence of coronary angioplasty implant and graft: Secondary | ICD-10-CM

## 2013-12-16 DIAGNOSIS — Z7901 Long term (current) use of anticoagulants: Secondary | ICD-10-CM

## 2013-12-16 DIAGNOSIS — I2510 Atherosclerotic heart disease of native coronary artery without angina pectoris: Secondary | ICD-10-CM | POA: Diagnosis present

## 2013-12-16 DIAGNOSIS — I5023 Acute on chronic systolic (congestive) heart failure: Secondary | ICD-10-CM | POA: Diagnosis not present

## 2013-12-16 DIAGNOSIS — I4892 Unspecified atrial flutter: Secondary | ICD-10-CM

## 2013-12-16 DIAGNOSIS — I959 Hypotension, unspecified: Secondary | ICD-10-CM | POA: Diagnosis present

## 2013-12-16 DIAGNOSIS — I255 Ischemic cardiomyopathy: Secondary | ICD-10-CM | POA: Diagnosis present

## 2013-12-16 DIAGNOSIS — I129 Hypertensive chronic kidney disease with stage 1 through stage 4 chronic kidney disease, or unspecified chronic kidney disease: Secondary | ICD-10-CM | POA: Diagnosis present

## 2013-12-16 DIAGNOSIS — Z7982 Long term (current) use of aspirin: Secondary | ICD-10-CM

## 2013-12-16 DIAGNOSIS — N189 Chronic kidney disease, unspecified: Secondary | ICD-10-CM | POA: Diagnosis present

## 2013-12-16 LAB — BASIC METABOLIC PANEL
Anion gap: 13 (ref 5–15)
BUN: 24 mg/dL — ABNORMAL HIGH (ref 6–23)
CHLORIDE: 101 meq/L (ref 96–112)
CO2: 22 mEq/L (ref 19–32)
CREATININE: 1.23 mg/dL (ref 0.50–1.35)
Calcium: 9.2 mg/dL (ref 8.4–10.5)
GFR calc non Af Amer: 61 mL/min — ABNORMAL LOW (ref >90)
GFR, EST AFRICAN AMERICAN: 70 mL/min — AB (ref >90)
GLUCOSE: 266 mg/dL — AB (ref 70–99)
Potassium: 4.8 mEq/L (ref 3.7–5.3)
Sodium: 136 mEq/L — ABNORMAL LOW (ref 137–147)

## 2013-12-16 LAB — CBC
HEMATOCRIT: 39.5 % (ref 39.0–52.0)
Hemoglobin: 13 g/dL (ref 13.0–17.0)
MCH: 27.4 pg (ref 26.0–34.0)
MCHC: 32.9 g/dL (ref 30.0–36.0)
MCV: 83.3 fL (ref 78.0–100.0)
Platelets: 152 10*3/uL (ref 150–400)
RBC: 4.74 MIL/uL (ref 4.22–5.81)
RDW: 13.7 % (ref 11.5–15.5)
WBC: 4.4 10*3/uL (ref 4.0–10.5)

## 2013-12-16 LAB — HEPATIC FUNCTION PANEL
ALBUMIN: 2.9 g/dL — AB (ref 3.5–5.2)
ALT: 21 U/L (ref 0–53)
AST: 22 U/L (ref 0–37)
Alkaline Phosphatase: 75 U/L (ref 39–117)
BILIRUBIN TOTAL: 0.5 mg/dL (ref 0.3–1.2)
Bilirubin, Direct: <0.2 mg/dL (ref 0.0–0.3)
Total Protein: 7.1 g/dL (ref 6.0–8.3)

## 2013-12-16 LAB — MRSA PCR SCREENING: MRSA by PCR: NEGATIVE

## 2013-12-16 LAB — TROPONIN I

## 2013-12-16 LAB — I-STAT TROPONIN, ED: TROPONIN I, POC: 0.01 ng/mL (ref 0.00–0.08)

## 2013-12-16 LAB — I-STAT CG4 LACTIC ACID, ED: LACTIC ACID, VENOUS: 1.98 mmol/L (ref 0.5–2.2)

## 2013-12-16 LAB — GLUCOSE, CAPILLARY: Glucose-Capillary: 280 mg/dL — ABNORMAL HIGH (ref 70–99)

## 2013-12-16 LAB — PRO B NATRIURETIC PEPTIDE: Pro B Natriuretic peptide (BNP): 1744 pg/mL — ABNORMAL HIGH (ref 0–125)

## 2013-12-16 MED ORDER — FUROSEMIDE 10 MG/ML IJ SOLN
40.0000 mg | Freq: Once | INTRAMUSCULAR | Status: AC
  Administered 2013-12-17: 40 mg via INTRAVENOUS
  Filled 2013-12-16: qty 4

## 2013-12-16 MED ORDER — ATORVASTATIN CALCIUM 80 MG PO TABS
80.0000 mg | ORAL_TABLET | Freq: Every day | ORAL | Status: DC
  Administered 2013-12-16 – 2013-12-22 (×7): 80 mg via ORAL
  Filled 2013-12-16 (×7): qty 1

## 2013-12-16 MED ORDER — SODIUM CHLORIDE 0.9 % IV SOLN: 250.0000 mL | INTRAVENOUS | Status: DC | PRN

## 2013-12-16 MED ORDER — ACETAMINOPHEN 325 MG PO TABS: 650.0000 mg | ORAL_TABLET | ORAL | Status: DC | PRN

## 2013-12-16 MED ORDER — HEPARIN SODIUM (PORCINE) 5000 UNIT/ML IJ SOLN
5000.0000 [IU] | Freq: Three times a day (TID) | INTRAMUSCULAR | Status: DC
  Administered 2013-12-16 – 2013-12-21 (×13): 5000 [IU] via SUBCUTANEOUS
  Filled 2013-12-16 (×20): qty 1

## 2013-12-16 MED ORDER — CARVEDILOL 3.125 MG PO TABS
3.1250 mg | ORAL_TABLET | Freq: Two times a day (BID) | ORAL | Status: DC
  Filled 2013-12-16 (×3): qty 1

## 2013-12-16 MED ORDER — ASPIRIN EC 81 MG PO TBEC
81.0000 mg | DELAYED_RELEASE_TABLET | Freq: Every day | ORAL | Status: DC
  Administered 2013-12-16 – 2013-12-22 (×7): 81 mg via ORAL
  Filled 2013-12-16 (×7): qty 1

## 2013-12-16 MED ORDER — SOTALOL HCL 120 MG PO TABS
120.0000 mg | ORAL_TABLET | Freq: Two times a day (BID) | ORAL | Status: DC
  Administered 2013-12-16 – 2013-12-22 (×10): 120 mg via ORAL
  Filled 2013-12-16 (×14): qty 1

## 2013-12-16 MED ORDER — ASPIRIN EC 325 MG PO TBEC
325.0000 mg | DELAYED_RELEASE_TABLET | Freq: Every day | ORAL | Status: DC
  Filled 2013-12-16: qty 1

## 2013-12-16 MED ORDER — LOSARTAN POTASSIUM 50 MG PO TABS
50.0000 mg | ORAL_TABLET | Freq: Every day | ORAL | Status: DC
  Filled 2013-12-16: qty 1

## 2013-12-16 MED ORDER — INSULIN ASPART PROT & ASPART (70-30 MIX) 100 UNIT/ML ~~LOC~~ SUSP
15.0000 [IU] | Freq: Two times a day (BID) | SUBCUTANEOUS | Status: DC
  Administered 2013-12-17 – 2013-12-22 (×10): 15 [IU] via SUBCUTANEOUS
  Filled 2013-12-16 (×2): qty 10

## 2013-12-16 MED ORDER — SODIUM CHLORIDE 0.9 % IJ SOLN
3.0000 mL | INTRAMUSCULAR | Status: DC | PRN
  Administered 2013-12-21: 3 mL via INTRAVENOUS

## 2013-12-16 MED ORDER — ZOLPIDEM TARTRATE 5 MG PO TABS: 5.0000 mg | ORAL_TABLET | Freq: Every evening | ORAL | Status: DC | PRN

## 2013-12-16 MED ORDER — FUROSEMIDE 10 MG/ML IJ SOLN
40.0000 mg | Freq: Once | INTRAMUSCULAR | Status: AC
  Administered 2013-12-16: 40 mg via INTRAVENOUS
  Filled 2013-12-16: qty 4

## 2013-12-16 MED ORDER — SODIUM CHLORIDE 0.9 % IJ SOLN
3.0000 mL | Freq: Two times a day (BID) | INTRAMUSCULAR | Status: DC
  Administered 2013-12-16 – 2013-12-22 (×10): 3 mL via INTRAVENOUS

## 2013-12-16 MED ORDER — ONDANSETRON HCL 4 MG/2ML IJ SOLN: 4.0000 mg | Freq: Four times a day (QID) | INTRAMUSCULAR | Status: DC | PRN

## 2013-12-16 MED ORDER — INSULIN ASPART 100 UNIT/ML ~~LOC~~ SOLN
0.0000 [IU] | Freq: Three times a day (TID) | SUBCUTANEOUS | Status: DC
  Administered 2013-12-17: 3 [IU] via SUBCUTANEOUS
  Administered 2013-12-17: 5 [IU] via SUBCUTANEOUS
  Administered 2013-12-18: 2 [IU] via SUBCUTANEOUS
  Administered 2013-12-18: 3 [IU] via SUBCUTANEOUS
  Administered 2013-12-18: 2 [IU] via SUBCUTANEOUS
  Administered 2013-12-19: 3 [IU] via SUBCUTANEOUS
  Administered 2013-12-20: 2 [IU] via SUBCUTANEOUS
  Administered 2013-12-21 (×2): 5 [IU] via SUBCUTANEOUS
  Administered 2013-12-21 – 2013-12-22 (×3): 2 [IU] via SUBCUTANEOUS

## 2013-12-16 NOTE — ED Provider Notes (Signed)
CSN: [id number]     Arrival date & time 12/16/13  1408 History   First MD Initiated Contact with Patient 12/16/13 1450     Chief Complaint  Patient presents with  . Shortness of Breath     (Consider location/radiation/quality/duration/timing/severity/associated sxs/prior Treatment) The history is provided by the patient and medical records. No language interpreter was used.    [name] [name] is a 63 y.o. male  with a hx of IDDM, ICD, CKD, a-flutter, ischemic cardiomyopathy, chronic systolic heart failure HTN presents to the Emergency Department complaining of gradual, persistent, waxing and waning SOB onset 3 days ago, worse with eating.  Pt reports it is better with sitting straight up or in the tripod position.  Pt denies cough, fever, sick contacts, rhinorrhea, nasal congestion.  Associated symptoms include mild upper chest pain described as a tightness, peripheral edema and 6lb weight gain in the last several days.  Pt denies fever, chills, headache, neck pain, abd pain, N/V/D, weakness, dizziness, syncope, dysuria, hematuria.     Cardiologist:  Bayard Beaver PCP: Tamsen Roers  Past Medical History  Diagnosis Date  . DM type 2 (diabetes mellitus, type 2)   . Ischemic cardiomyopathy   . HTN (hypertension)   . Hypercholesteremia     ablation  . Ventricular tachycardia   . CAD (coronary artery disease) 3716,9678 X 2     RCA-T, 70% PL (off CFX), 99% Prox LAD/90% Dist LAD, S/P TAXUS stent x 2  . ICD (implantable cardiac defibrillator) in place   . CKD (chronic kidney disease)   . Nephrolithiasis   . Atrial flutter 04/2012    s/p TEE-EPS+RFCA 04/2012  . Chronic anticoagulation   . Chronic systolic heart failure    Past Surgical History  Procedure Laterality Date  . Percutaneous coronary stent intervention (pci-s)  January 2002    PTCA/Stent Distal RCA  . Cardiac defibrillator placement  2007     Medtronic Maximo VR, serial number [id number]  . Percutaneous coronary  stent intervention (pci-s)  June 2002    PTCA/Stent x 3 RCA, thrombolysis - failed  . Percutaneous coronary stent intervention (pci-s)  July 2006    TAXUS stents to prox and distal LAD   History reviewed. No pertinent family history. History  Substance Use Topics  . Smoking status: Former Smoker    Quit date: 03/02/2000  . Smokeless tobacco: Not on file  . Alcohol Use: No    Review of Systems  Constitutional: Negative for fever, diaphoresis, appetite change, fatigue and unexpected weight change.  HENT: Negative for mouth sores.   Eyes: Negative for visual disturbance.  Respiratory: Positive for chest tightness and shortness of breath. Negative for cough and wheezing.   Cardiovascular: Negative for chest pain.  Gastrointestinal: Negative for nausea, vomiting, abdominal pain, diarrhea and constipation.  Endocrine: Negative for polydipsia, polyphagia and polyuria.  Genitourinary: Negative for dysuria, urgency, frequency and hematuria.  Musculoskeletal: Negative for back pain and neck stiffness.  Skin: Negative for rash.  Allergic/Immunologic: Negative for immunocompromised state.  Neurological: Negative for syncope, light-headedness and headaches.  Hematological: Does not bruise/bleed easily.  Psychiatric/Behavioral: Negative for sleep disturbance. The patient is not nervous/anxious.       Allergies  Penicillins  Home Medications   Prior to Admission medications   Medication Sig Start Date End Date Taking? Authorizing Provider  aspirin 325 MG EC tablet Take 325 mg by mouth daily.   Yes Historical Provider, MD  atorvastatin (LIPITOR) 80 MG tablet Take 1 tablet (80  mg total) by mouth daily. 05/08/13  Yes Lelon Perla, MD  carvedilol (COREG) 6.25 MG tablet Take 6.25 mg by mouth 2 (two) times daily with a meal.   Yes Historical Provider, MD  furosemide (LASIX) 20 MG tablet Take 20-40 mg by mouth daily. Take 20 mg by mouth once daily. May take up to 40 mg by mouth daily if needed  for weight gain of 5 pounds   Yes Historical Provider, MD  insulin NPH-insulin regular (NOVOLIN 70/30 RELION) (70-30) 100 UNIT/ML injection Inject 15 Units into the skin 2 (two) times daily with a meal. 05/31/12  Yes Rhonda G Barrett, PA-C  losartan (COZAAR) 50 MG tablet Take 1 tablet (50 mg total) by mouth daily. 11/23/13  Yes Lelon Perla, MD  magnesium oxide (MAG-OX) 400 MG tablet Take 1 tablet (400 mg total) by mouth daily. 06/01/12  Yes Liliane Shi, PA-C  Multiple Vitamins-Minerals (MULTIVITAMIN PO) Take 1 capsule by mouth daily.    Yes Historical Provider, MD  sotalol (BETAPACE) 120 MG tablet Take 120 mg by mouth 2 (two) times daily.   Yes Historical Provider, MD   BP 81/56  Pulse 60  Temp(Src) 97.4 F (36.3 C) (Oral)  Resp 16  Ht 5\' 11"  (1.803 m)  Wt 228 lb (103.42 kg)  BMI 31.81 kg/m2  SpO2 99% Physical Exam  Nursing note and vitals reviewed. Constitutional: He appears well-developed and well-nourished. No distress.  Awake, alert, nontoxic appearance  HENT:  Head: Normocephalic and atraumatic.  Mouth/Throat: Oropharynx is clear and moist. No oropharyngeal exudate.  Eyes: Conjunctivae are normal. No scleral icterus.  Neck: Normal range of motion. Neck supple.  Cardiovascular: Normal rate, regular rhythm, normal heart sounds and intact distal pulses.   No murmur heard. Pulmonary/Chest: Effort normal. No respiratory distress. He has decreased breath sounds. He has no wheezes. He has no rales.  Equal chest expansion Diminished, but clear and equal breath sounds; no rales  Abdominal: Soft. Bowel sounds are normal. He exhibits no distension and no mass. There is no tenderness. There is no rebound and no guarding.  Musculoskeletal: Normal range of motion. He exhibits edema.  Bilateral nonpitting edema of the BLE  Neurological: He is alert. He exhibits normal muscle tone. Coordination normal.  Speech is clear and goal oriented Moves extremities without ataxia  Skin: Skin is warm  and dry. He is not diaphoretic.  Psychiatric: He has a normal mood and affect.    ED Course  Procedures (including critical care time) Labs Review Labs Reviewed  BASIC METABOLIC PANEL - Abnormal; Notable for the following:    Sodium 136 (*)    Glucose, Bld 266 (*)    BUN 24 (*)    GFR calc non Af Amer 61 (*)    GFR calc Af Amer 70 (*)    All other components within normal limits  PRO B NATRIURETIC PEPTIDE - Abnormal; Notable for the following:    Pro B Natriuretic peptide (BNP) 1744.0 (*)    All other components within normal limits  HEPATIC FUNCTION PANEL - Abnormal; Notable for the following:    Albumin 2.9 (*)    All other components within normal limits  CBC  I-STAT TROPOININ, ED  I-STAT CG4 LACTIC ACID, ED    Imaging Review Dg Chest 2 View  12/16/2013   CLINICAL DATA:  Shortness of breath started 3 days ago. No chest pain.  EXAM: CHEST  2 VIEW  COMPARISON:  05/21/2012  FINDINGS: Bilateral mild interstitial thickening.  No pleural effusion or pneumothorax. Stable cardiomegaly. Single lead AICD. Unremarkable osseous structures.  IMPRESSION: Bilateral mild interstitial thickening which may reflect mild pulmonary edema versus pneumonitis secondary to an infectious or inflammatory etiology.   Electronically Signed   By: Kathreen Devoid   On: 12/16/2013 16:16     EKG Interpretation   Date/Time:  Saturday December 16 2013 14:18:00 EDT Ventricular Rate:  63 PR Interval:  248 QRS Duration: 150 QT Interval:  448 QTC Calculation: 458 R Axis:   -59 Text Interpretation:  Sinus rhythm with 1st degree A-V block Left axis  deviation Non-specific intra-ventricular conduction block Possible Lateral  infarct , age undetermined Inferior infarct , age undetermined Abnormal  ECG since last tracing no significant change Confirmed by [facility]  MD,  ELLIOTT [phone]) on 12/16/2013 3:20:47 PM      05/05/13 Echo:  Study Conclusions  - Left ventricle: There is akinesis of the apical anterior wall  and mid anteroseptum There is severe hypokinesis to akinesis of the entire inferior wall There is akinesis of the mid inferoseptum and apical septum. The cavity size was normal. There was mild concentric hypertrophy. Systolic function was severely reduced. The estimated ejection fraction was in the range of 20% to 25%. Diffuse hypokinesis. There is akinesis of the entireapical myocardium. - Ventricular septum: Septal motion showed paradox. - Mitral valve: Mild regurgitation. - Left atrium: The atrium was mildly dilated. - Right ventricle: The cavity size was mildly dilated. Wall thickness was normal. - Tricuspid valve: Mild regurgitation.    MDM   Final diagnoses:  Acute on chronic systolic congestive heart failure  Peripheral edema    [name] presents with hypotension, SOB, peripheral edema, 6 lb weight gain and hx of chronic systolic heart failure and ischemic cardio myopathy.  Patient likely with CHF exacerbation today. Last echo in March of 2015 showed EF of 20-25%. Patient's last CHF exacerbation was March of 2015 which required an admission. He followed up with Dr. [name] on 11/02/2013 at that time had no problems with his ICD and was feeling well.  BP 96/66  Pulse 66  Temp(Src) 97.4 F (36.3 C) (Oral)  Resp 15  Ht 5\' 11"  (1.803 m)  Wt 228 lb (103.42 kg)  BMI 31.81 kg/m2  SpO2 98%  CXR pending.  Will plan for cardiology consult at that time. Pt is NAD at this time and denies dizziness or near syncope.    5:03 PM Elevated BNP and CXR with pulmonary edema versus pneumonitis.  Patient is afebrile without cough and clinically inconsistent with pneumonia, likely CHF. Will consult cardiology. Patient remains hypotensive, but in no distress.  BP 72/52  Pulse 61  Temp(Src) 97.4 F (36.3 C) (Oral)  Resp 16  Ht 5\' 11"  (1.803 m)  Wt 228 lb (103.42 kg)  BMI 31.81 kg/m2  SpO2 100%   The patient was discussed with and seen by Dr. Eulis Foster who agrees with the  treatment plan.   5:24 PM Discussed Dr. Murvin Natal (cardiology) who plans for a dopamine and lasix drip.  Pt will be admitted to step-down or ICU.  Hepatic function panel and lactic acid pending.    6:21 PM Pt with normal LFTs and lactic acid.  Pt evaluated by Dr. Carlye Grippe who will admit to stepdown.  BP 81/56  Pulse 60  Temp(Src) 97.4 F (36.3 C) (Oral)  Resp 16  Ht 5\' 11"  (1.803 m)  Wt 228 lb (103.42 kg)  BMI 31.81 kg/m2  SpO2 99%  Abigail Butts, PA-C 12/16/13 [place], PA-C 12/16/13 1839

## 2013-12-16 NOTE — ED Notes (Signed)
Spoke to Dr.Mehta concerning lasix; medication to be given at this time

## 2013-12-16 NOTE — ED Notes (Signed)
[name] [name], [name] aware of bilateral blood pressures. Pt is asymptomatic with hypotension. A&O x4

## 2013-12-16 NOTE — ED Notes (Signed)
Cardiology paged for concerns of lasix and blood pressure

## 2013-12-16 NOTE — H&P (Addendum)
Admit date: 12/16/2013 Primary Physician  Tamsen Roers, MD Primary Cardiologist  Dr. [name]  CC: dyspnea  HPI: [name] [name] is a pleasant 63 y.o male, pt of Dr. [name], with ICM, HFrEF, CAD s/p PCI to RCA and LAD, VT s/p single chamber ICD for secondary prevention, AFL, moderate [name], IDDM, HTN, HLD, CKD who presents with chief complaint of dyspnea.   Pt presents here with his daughter and wife. He is able to provide accurate HPI. Pt has had progressive dyspnea starting on Wednesday. PND, orthopnea and DOE. Denies fever. Has clear sputum production and mild cough. Cough is worse with lying flat. No sick contact. Complaints of chills. No chest pain or palpitations. No diarrhea or constipation. His urine output has decreased with the home lasix dose of 20mg . He has noticed decreasing urine output. Denies eating a high salt meal and has not changed his fluid intake. His weight has increased 6 lbs since Tuesday. His labs today show Na 136, Cr 1.23 (baseline 1-1.2), BNP 1744. Lactic acid 1.98, glc 266. LFTs are pending. CXR with mild interstitial edema. ECG with NSR, 1st deg AVB, p-mitrale, inf and ant q waves.   Pt last saw Dr. [name] on 04/17/2013. LHC 7/06: EF 35-40%, mLAD 99%, dLAD 90%, mid to dist CFX 70%, RCA occluded. PCI: DES to mid and dist LAD. Carotids 4/12: 0-39% bilat ICA. Myovue 5/13 EF 25, septal, apical and inferior infarct; no ischemia. Latest ECHO 05/05/2013 - EF 20-25%.   PMH:   Past Medical History  Diagnosis Date  . DM type 2 (diabetes mellitus, type 2)   . Ischemic cardiomyopathy   . HTN (hypertension)   . Hypercholesteremia     ablation  . Ventricular tachycardia   . CAD (coronary artery disease) 3291,9166 X 2     RCA-T, 70% PL (off CFX), 99% Prox LAD/90% Dist LAD, S/P TAXUS stent x 2  . ICD (implantable cardiac defibrillator) in place   . CKD (chronic kidney disease)   . Nephrolithiasis   . Atrial flutter 04/2012    s/p TEE-EPS+RFCA 04/2012  . Chronic anticoagulation    . Chronic systolic heart failure     PSH:   Past Surgical History  Procedure Laterality Date  . Percutaneous coronary stent intervention (pci-s)  January 2002    PTCA/Stent Distal RCA  . Cardiac defibrillator placement  2007     Medtronic Maximo VR, serial number [id number]  . Percutaneous coronary stent intervention (pci-s)  June 2002    PTCA/Stent x 3 RCA, thrombolysis - failed  . Percutaneous coronary stent intervention (pci-s)  July 2006    TAXUS stents to prox and distal LAD   Allergies:  Penicillins Prior to Admit Meds:   Prior to Admission medications   Medication Sig Start Date End Date Taking? Authorizing Provider  aspirin 325 MG EC tablet Take 325 mg by mouth daily.   Yes Historical Provider, MD  atorvastatin (LIPITOR) 80 MG tablet Take 1 tablet (80 mg total) by mouth daily. 05/08/13  Yes Lelon Perla, MD  carvedilol (COREG) 6.25 MG tablet Take 6.25 mg by mouth 2 (two) times daily with a meal.   Yes Historical Provider, MD  furosemide (LASIX) 20 MG tablet Take 20-40 mg by mouth daily. Take 20 mg by mouth once daily. May take up to 40 mg by mouth daily if needed for weight gain of 5 pounds   Yes Historical Provider, MD  insulin NPH-insulin regular (NOVOLIN 70/30 RELION) (70-30) 100 UNIT/ML injection Inject 15 Units  into the skin 2 (two) times daily with a meal. 05/31/12  Yes Rhonda G Barrett, PA-C  losartan (COZAAR) 50 MG tablet Take 1 tablet (50 mg total) by mouth daily. 11/23/13  Yes Lelon Perla, MD  magnesium oxide (MAG-OX) 400 MG tablet Take 1 tablet (400 mg total) by mouth daily. 06/01/12  Yes Liliane Shi, PA-C  Multiple Vitamins-Minerals (MULTIVITAMIN PO) Take 1 capsule by mouth daily.    Yes Historical Provider, MD  sotalol (BETAPACE) 120 MG tablet Take 120 mg by mouth 2 (two) times daily.   Yes Historical Provider, MD   Fam HX:   History reviewed. No pertinent family history. Social HX:    History   Social History  . Marital Status: Married    Spouse Name:  N/A    Number of Children: 3  . Years of Education: N/A   Occupational History  . [name] [name] - currently unemployed    Social History Main Topics  . Smoking status: Former Smoker    Quit date: 03/02/2000  . Smokeless tobacco: Not on file  . Alcohol Use: No  . Drug Use: No  . Sexual Activity: Not on file   Other Topics Concern  . Not on file   Social History Narrative   Pt lives with wife. He hunts regularly and is active, walking > 1 mile at times without difficulty.     ROS:  All 11 ROS were addressed and are negative except what is stated in the HPI   Physical Exam: Blood pressure 81/56, pulse 60, temperature 97.4 F (36.3 C), temperature source Oral, resp. rate 16, height 5\' 11"  (1.803 m), weight 103.42 kg (228 lb), SpO2 99.00%.   Gen - pt is alert, awake and oriented. No neurological signs of hypoperfusion PERRL, EOMI JVP to 3cm over clavicle Lungs - rales at base CV - s1s2 heard, 2/6 regurgitant murmur Abd - +bs, nd, nt Ext - 1+ edema at ankle Pt is well perfused.       Labs:   Lab Results  Component Value Date   WBC 4.4 12/16/2013   HGB 13.0 12/16/2013   HCT 39.5 12/16/2013   MCV 83.3 12/16/2013   PLT 152 12/16/2013    Recent Labs Lab 12/16/13 1426  NA 136*  K 4.8  CL 101  CO2 22  BUN 24*  CREATININE 1.23  CALCIUM 9.2  GLUCOSE 266*   No results found for this basename: CKTOTAL, CKMB, TROPONINI,  in the last 72 hours Lab Results  Component Value Date   CHOL 146 09/13/2013   HDL 38.00* 09/13/2013   LDLCALC 94 09/13/2013   TRIG 71.0 09/13/2013   No results found for this basename: DDIMER     Radiology:  Dg Chest 2 View  12/16/2013   CLINICAL DATA:  Shortness of breath started 3 days ago. No chest pain.  EXAM: CHEST  2 VIEW  COMPARISON:  05/21/2012  FINDINGS: Bilateral mild interstitial thickening. No pleural effusion or pneumothorax. Stable cardiomegaly. Single lead AICD. Unremarkable osseous structures.  IMPRESSION: Bilateral mild  interstitial thickening which may reflect mild pulmonary edema versus pneumonitis secondary to an infectious or inflammatory etiology.   Electronically Signed   By: Kathreen Devoid   On: 12/16/2013 16:16   Personally viewed.   EKG:  ECG with NSR, 1st deg AVB, p-mitrale, inf and anteroseptal q waves.   ASSESSMENT/PLAN Dyspnea, Acute exacerbation of HFrEF - diurese with lasix 40mg  IV OTO in the ED - next dose at 6am.  -  daily weights and strict in and out  Hypotension without objective signs of hypoperfusion - ED will try to insert A-line to see if BP cuff correlates with invasive hemodynamic - pt's SBP typically runs in 100s.  - we will monitor pt in the step down unit.   CAD/Ischemic Cardiomyopathy - decrease home coreg to 3.125mg  PO BID. First dose in am.  - cont losartan 50mg  po qd first dose in am.   AFL/VT - cont sotalol 120mg  po bid  HLD - cont lipitor 80mg  po qd   IDDM - cont home insulin 70/30 15U BID - SSI  FEN - SL IVF - electrolyte replacement prn - cardiac diet  Ethics - full code  [place] after acute illness resolves  Orson Gear, MD  12/16/2013  5:32 PM

## 2013-12-16 NOTE — ED Notes (Signed)
Hannah PA at bedside

## 2013-12-16 NOTE — ED Notes (Signed)
Bed placement aware of change in bed request

## 2013-12-16 NOTE — ED Notes (Signed)
Cardiologist at bedside.  

## 2013-12-16 NOTE — ED Notes (Signed)
Per pt sts that he has been SOB for a few days. Worse when lying down and exerting himself. denies any chest pain. Denies cough, fever.

## 2013-12-16 NOTE — Progress Notes (Signed)
RT nor charge RT could place the arterial line. RT informed RN.

## 2013-12-16 NOTE — ED Provider Notes (Signed)
  Face-to-face evaluation   History: SOB for several days despite taking usual meds. He has CHF with last EF 20-25%. Increased DOE and has anorexia. No Chest Pain  Physical exam: Alert calm. O2 sat 100% on RA. BP 74 systolic, while semi-recumbent. Mild JVD and 2+ peripheral edema. Lungs with mild decreased air mvt. Bilat. Normal mentation. He appears comfortable.  Medical screening examination/treatment/procedure(s) were conducted as a shared visit with non-physician practitioner(s) and myself.  I personally evaluated the patient during the encounter   Richarda Blade, MD 12/17/13 [phone]

## 2013-12-16 NOTE — ED Notes (Signed)
Merry Proud, RT aware of need for arterial line

## 2013-12-16 NOTE — ED Notes (Signed)
Spoke to [place], [name] concerning telemetry bed request ; bed request changed to stepdown bed; Lasix also held due to blood pressure; Jarrett Soho PA aware

## 2013-12-17 DIAGNOSIS — I255 Ischemic cardiomyopathy: Secondary | ICD-10-CM | POA: Diagnosis present

## 2013-12-17 DIAGNOSIS — I959 Hypotension, unspecified: Secondary | ICD-10-CM | POA: Diagnosis present

## 2013-12-17 DIAGNOSIS — Z880 Allergy status to penicillin: Secondary | ICD-10-CM | POA: Diagnosis not present

## 2013-12-17 DIAGNOSIS — N189 Chronic kidney disease, unspecified: Secondary | ICD-10-CM | POA: Diagnosis present

## 2013-12-17 DIAGNOSIS — I5023 Acute on chronic systolic (congestive) heart failure: Secondary | ICD-10-CM | POA: Diagnosis present

## 2013-12-17 DIAGNOSIS — Z87891 Personal history of nicotine dependence: Secondary | ICD-10-CM | POA: Diagnosis not present

## 2013-12-17 DIAGNOSIS — T462X5A Adverse effect of other antidysrhythmic drugs, initial encounter: Secondary | ICD-10-CM | POA: Diagnosis present

## 2013-12-17 DIAGNOSIS — I340 Nonrheumatic mitral (valve) insufficiency: Secondary | ICD-10-CM

## 2013-12-17 DIAGNOSIS — R0602 Shortness of breath: Secondary | ICD-10-CM | POA: Diagnosis present

## 2013-12-17 DIAGNOSIS — Z7982 Long term (current) use of aspirin: Secondary | ICD-10-CM | POA: Diagnosis not present

## 2013-12-17 DIAGNOSIS — I129 Hypertensive chronic kidney disease with stage 1 through stage 4 chronic kidney disease, or unspecified chronic kidney disease: Secondary | ICD-10-CM | POA: Diagnosis present

## 2013-12-17 DIAGNOSIS — Z7901 Long term (current) use of anticoagulants: Secondary | ICD-10-CM | POA: Diagnosis not present

## 2013-12-17 DIAGNOSIS — E780 Pure hypercholesterolemia: Secondary | ICD-10-CM | POA: Diagnosis present

## 2013-12-17 DIAGNOSIS — Z95810 Presence of automatic (implantable) cardiac defibrillator: Secondary | ICD-10-CM | POA: Diagnosis not present

## 2013-12-17 DIAGNOSIS — Z955 Presence of coronary angioplasty implant and graft: Secondary | ICD-10-CM | POA: Diagnosis not present

## 2013-12-17 DIAGNOSIS — I10 Essential (primary) hypertension: Secondary | ICD-10-CM

## 2013-12-17 DIAGNOSIS — E785 Hyperlipidemia, unspecified: Secondary | ICD-10-CM | POA: Diagnosis present

## 2013-12-17 DIAGNOSIS — I472 Ventricular tachycardia: Secondary | ICD-10-CM | POA: Diagnosis present

## 2013-12-17 DIAGNOSIS — Z794 Long term (current) use of insulin: Secondary | ICD-10-CM | POA: Diagnosis not present

## 2013-12-17 DIAGNOSIS — I4892 Unspecified atrial flutter: Secondary | ICD-10-CM

## 2013-12-17 DIAGNOSIS — E119 Type 2 diabetes mellitus without complications: Secondary | ICD-10-CM | POA: Diagnosis present

## 2013-12-17 DIAGNOSIS — G479 Sleep disorder, unspecified: Secondary | ICD-10-CM | POA: Diagnosis present

## 2013-12-17 DIAGNOSIS — I2510 Atherosclerotic heart disease of native coronary artery without angina pectoris: Secondary | ICD-10-CM | POA: Diagnosis present

## 2013-12-17 LAB — GLUCOSE, CAPILLARY
GLUCOSE-CAPILLARY: 150 mg/dL — AB (ref 70–99)
GLUCOSE-CAPILLARY: 91 mg/dL (ref 70–99)
Glucose-Capillary: 216 mg/dL — ABNORMAL HIGH (ref 70–99)
Glucose-Capillary: 148 mg/dL — ABNORMAL HIGH (ref 70–99)

## 2013-12-17 LAB — BASIC METABOLIC PANEL
Anion gap: 13 (ref 5–15)
BUN: 21 mg/dL (ref 6–23)
CALCIUM: 8.6 mg/dL (ref 8.4–10.5)
CO2: 23 mEq/L (ref 19–32)
Chloride: 99 mEq/L (ref 96–112)
Creatinine, Ser: 1.24 mg/dL (ref 0.50–1.35)
GFR, EST AFRICAN AMERICAN: 70 mL/min — AB (ref >90)
GFR, EST NON AFRICAN AMERICAN: 60 mL/min — AB (ref >90)
Glucose, Bld: 237 mg/dL — ABNORMAL HIGH (ref 70–99)
POTASSIUM: 4 meq/L (ref 3.7–5.3)
SODIUM: 135 meq/L — AB (ref 137–147)

## 2013-12-17 LAB — TROPONIN I
Troponin I: <0.3 ng/mL (ref <0.30)
Troponin I: <0.3 ng/mL (ref <0.30)

## 2013-12-17 NOTE — Progress Notes (Signed)
Cardiologist on-call d/c ordered to place a-line. Pt is hypotensive 95/47 (62), but asymptomatic. Cardiologist explained to family at bedside the reason for not placing a-line.

## 2013-12-17 NOTE — Progress Notes (Signed)
Utilization Review Completed.   Saathvik Every, RN, BSN Nurse Case Manager  

## 2013-12-17 NOTE — Progress Notes (Signed)
SUBJECTIVE:  Feels much better this am with less SOB  OBJECTIVE:   Vitals:   Filed Vitals:   12/17/13 0702 12/17/13 1131 12/17/13 1136 12/17/13 1139  BP: 87/58 73/50 68/50  89/55  Pulse: 59 62    Temp: 98.2 F (36.8 C) 98.3 F (36.8 C)    TempSrc: Oral Oral    Resp: 25 24    Height:      Weight:      SpO2: 96% 97%     I&O's:   Intake/Output Summary (Last 24 hours) at 12/17/13 1141 Last data filed at 12/17/13 1130  Gross per 24 hour  Intake      0 ml  Output   1275 ml  Net  -1275 ml   TELEMETRY: Reviewed telemetry pt in NSR:     PHYSICAL EXAM General: Well developed, well nourished, in no acute distress  Lungs:   Crackles at bases bilaterally Heart:   HRRR S1 S2 Abdomen: Bowel sounds are positive, abdomen soft and non-tender without masses Extremities:   No clubbing, cyanosis or edema.  DP +1 Neuro: Alert and oriented X 3. Psych:  Good affect, responds appropriately   LABS: Basic Metabolic Panel:  Recent Labs  12/16/13 1426 12/17/13 0203  NA 136* 135*  K 4.8 4.0  CL 101 99  CO2 22 23  GLUCOSE 266* 237*  BUN 24* 21  CREATININE 1.23 1.24  CALCIUM 9.2 8.6   Liver Function Tests:  Recent Labs  12/16/13 1725  AST 22  ALT 21  ALKPHOS 75  BILITOT 0.5  PROT 7.1  ALBUMIN 2.9*   No results found for this basename: LIPASE, AMYLASE,  in the last 72 hours CBC:  Recent Labs  12/16/13 1426  WBC 4.4  HGB 13.0  HCT 39.5  MCV 83.3  PLT 152   Cardiac Enzymes:  Recent Labs  12/16/13 2109 12/17/13 0203 12/17/13 0745  TROPONINI <0.30 <0.30 <0.30   BNP: No components found with this basename: POCBNP,  D-Dimer: No results found for this basename: DDIMER,  in the last 72 hours Hemoglobin A1C: No results found for this basename: HGBA1C,  in the last 72 hours Fasting Lipid Panel: No results found for this basename: CHOL, HDL, LDLCALC, TRIG, CHOLHDL, LDLDIRECT,  in the last 72 hours Thyroid Function Tests: No results found for this basename:  TSH, T4TOTAL, FREET3, T3FREE, THYROIDAB,  in the last 72 hours Anemia Panel: No results found for this basename: VITAMINB12, FOLATE, FERRITIN, TIBC, IRON, RETICCTPCT,  in the last 72 hours Coag Panel:   Lab Results  Component Value Date   INR 3.1 06/15/2012   INR 2.7 06/08/2012   INR 3.5 06/03/2012    RADIOLOGY: Dg Chest 2 View  12/16/2013   CLINICAL DATA:  Shortness of breath started 3 days ago. No chest pain.  EXAM: CHEST  2 VIEW  COMPARISON:  05/21/2012  FINDINGS: Bilateral mild interstitial thickening. No pleural effusion or pneumothorax. Stable cardiomegaly. Single lead AICD. Unremarkable osseous structures.  IMPRESSION: Bilateral mild interstitial thickening which may reflect mild pulmonary edema versus pneumonitis secondary to an infectious or inflammatory etiology.   Electronically Signed   By: Kathreen Devoid   On: 12/16/2013 16:16    ASSESSMENT/PLAN  Acute on chronic systolic CHF.  Cardiac enzymes are negative. Diuresed well last night.   - Continue gentle diuresis with lasix IV as BP allows - daily weights and strict in and out  Hypotension - will stop coreg and ARB.  May need to consider  low dose Levophed for pressure support while diuresing if BP drops further.  He received a dose of Lasix at 6am today.  Will see how he diuresis during the day today.  Consider Advanced HF consult in am.  May need a trial of inotropic therapy but seem to respond to lasix well last night.  The main problem is persistent hypotension. [name] Ischemic Cardiomyopathy with VT s/p single chamber ICD - Hold coreg and Losartan for now due to hypotension Atrial Flutter s/p RFCA - cont sotalol 120mg  po bid  HLD  - cont lipitor 80mg  po qd  IDDM  - cont home insulin 70/30 15U BID  - SSI  CKD  Sueanne Margarita, MD  12/17/2013  11:41 AM

## 2013-12-18 ENCOUNTER — Inpatient Hospital Stay (HOSPITAL_COMMUNITY): Payer: 59

## 2013-12-18 DIAGNOSIS — I483 Typical atrial flutter: Secondary | ICD-10-CM

## 2013-12-18 LAB — BASIC METABOLIC PANEL
ANION GAP: 12 (ref 5–15)
BUN: 24 mg/dL — ABNORMAL HIGH (ref 6–23)
CO2: 24 meq/L (ref 19–32)
Calcium: 8.7 mg/dL (ref 8.4–10.5)
Chloride: 98 mEq/L (ref 96–112)
Creatinine, Ser: 1.41 mg/dL — ABNORMAL HIGH (ref 0.50–1.35)
GFR calc Af Amer: 60 mL/min — ABNORMAL LOW (ref >90)
GFR calc non Af Amer: 52 mL/min — ABNORMAL LOW (ref >90)
Glucose, Bld: 149 mg/dL — ABNORMAL HIGH (ref 70–99)
POTASSIUM: 3.5 meq/L — AB (ref 3.7–5.3)
SODIUM: 134 meq/L — AB (ref 137–147)

## 2013-12-18 LAB — GLUCOSE, CAPILLARY
GLUCOSE-CAPILLARY: 150 mg/dL — AB (ref 70–99)
GLUCOSE-CAPILLARY: 130 mg/dL — AB (ref 70–99)
Glucose-Capillary: 155 mg/dL — ABNORMAL HIGH (ref 70–99)
Glucose-Capillary: 125 mg/dL — ABNORMAL HIGH (ref 70–99)

## 2013-12-18 MED ORDER — POTASSIUM CHLORIDE ER 10 MEQ PO TBCR
40.0000 meq | EXTENDED_RELEASE_TABLET | Freq: Once | ORAL | Status: AC
  Administered 2013-12-18: 40 meq via ORAL
  Filled 2013-12-18 (×2): qty 4

## 2013-12-18 MED ORDER — FUROSEMIDE 10 MG/ML IJ SOLN
60.0000 mg | Freq: Once | INTRAMUSCULAR | Status: DC
  Filled 2013-12-18: qty 6

## 2013-12-18 MED ORDER — FUROSEMIDE 10 MG/ML IJ SOLN
20.0000 mg | Freq: Once | INTRAMUSCULAR | Status: AC
  Administered 2013-12-18: 20 mg via INTRAVENOUS
  Filled 2013-12-18: qty 2

## 2013-12-18 NOTE — Progress Notes (Signed)
PROGRESS NOTE  Subjective:   63 yo with hx of chronic systolic CHF, CAD VT, s/p single chamber ICD, Aflutter, IDDM, HTN, HLD   Admitted with dyspnea.   Objective:    Vital Signs:   Temp:  [97.4 F (36.3 C)-98.5 F (36.9 C)] 98.3 F (36.8 C) (10/19 0801) Pulse Rate:  [55-108] 62 (10/19 1100) Resp:  [13-37] 18 (10/19 1100) BP: (68-112)/(36-75) 101/57 mmHg (10/19 1000) SpO2:  [88 %-100 %] 99 % (10/19 1100) Weight:  [230 lb 1.6 oz (104.373 kg)] 230 lb 1.6 oz (104.373 kg) (10/19 0352)  Last BM Date: 12/17/13   24-hour weight change: Weight change: 2 lb 1.6 oz (0.953 kg)  Weight trends: Filed Weights   12/16/13 2010 12/17/13 0500 12/18/13 0352  Weight: 230 lb 13.2 oz (104.7 kg) 230 lb 13.2 oz (104.7 kg) 230 lb 1.6 oz (104.373 kg)    Intake/Output:  10/18 0701 - 10/19 0700 In: 600 [P.O.:600] Out: 1100 [Urine:1100] Total I/O In: 240 [P.O.:240] Out: -    Physical Exam: BP 101/57  Pulse 62  Temp(Src) 98.3 F (36.8 C) (Oral)  Resp 18  Ht 5\' 11"  (1.803 m)  Wt 230 lb 1.6 oz (104.373 kg)  BMI 32.11 kg/m2  SpO2 99%  Wt Readings from Last 3 Encounters:  12/18/13 230 lb 1.6 oz (104.373 kg)  11/02/13 233 lb (105.688 kg)  08/02/13 240 lb (108.863 kg)    General: Vital signs reviewed and noted.  Head: Normocephalic, atraumatic.  Eyes: conjunctivae/corneas clear.  EOM's intact.   Throat: normal  Neck:  normal   Lungs:    clear   Heart:  RR   Abdomen:  Soft, non-tender, non-distended    Extremities: Trace edema   Neurologic: A&O X3, CN II - XII are grossly intact.   Psych: Normal     Labs: BMET:  Recent Labs  12/17/13 0203 12/18/13 0310  NA 135* 134*  K 4.0 3.5*  CL 99 98  CO2 23 24  GLUCOSE 237* 149*  BUN 21 24*  CREATININE 1.24 1.41*  CALCIUM 8.6 8.7    Liver function tests:  Recent Labs  12/16/13 1725  AST 22  ALT 21  ALKPHOS 75  BILITOT 0.5  PROT 7.1  ALBUMIN 2.9*   No results found for this basename: LIPASE, AMYLASE,  in the  last 72 hours  CBC:  Recent Labs  12/16/13 1426  WBC 4.4  HGB 13.0  HCT 39.5  MCV 83.3  PLT 152    Cardiac Enzymes:  Recent Labs  12/16/13 2109 12/17/13 0203 12/17/13 0745  TROPONINI <0.30 <0.30 <0.30    Coagulation Studies: No results found for this basename: LABPROT, INR,  in the last 72 hours  Other:   Other results:  EKG :   NSR,NS ICD  Medications:    Infusions:    Scheduled Medications: . aspirin EC  81 mg Oral Daily  . atorvastatin  80 mg Oral Daily  . furosemide  60 mg Intravenous Once  . heparin  5,000 Units Subcutaneous 3 times per day  . insulin aspart  0-15 Units Subcutaneous TID WC  . insulin aspart protamine- aspart  15 Units Subcutaneous BID WC  . sodium chloride  3 mL Intravenous Q12H  . sotalol  120 mg Oral BID    Assessment/ Plan:   Active Problems:  1. Chronic systolic  CHF (congestive heart failure) He admits to not following a good diet. Continue meds Will give another dose of lasix.  Transfer to tele Possible DC tomorrow if he is feeling back to normal.   Disposition:  Length of Stay: 2  Ramond Dial., MD, [facility] 12/18/2013, 11:35 AM Office [phone] Pager [phone]

## 2013-12-18 NOTE — Progress Notes (Signed)
Pt taken to [facility] room 29 for transfer.  Report given to Freda Munro.  Pt alert and oriented no c/o cp.  On Room air.  Family with pt at time of transfer- has all belongings with them.

## 2013-12-19 LAB — BASIC METABOLIC PANEL
ANION GAP: 14 (ref 5–15)
BUN: 27 mg/dL — ABNORMAL HIGH (ref 6–23)
CALCIUM: 9.2 mg/dL (ref 8.4–10.5)
CO2: 23 mEq/L (ref 19–32)
Chloride: 99 mEq/L (ref 96–112)
Creatinine, Ser: 1.36 mg/dL — ABNORMAL HIGH (ref 0.50–1.35)
GFR, EST AFRICAN AMERICAN: 62 mL/min — AB (ref >90)
GFR, EST NON AFRICAN AMERICAN: 54 mL/min — AB (ref >90)
Glucose, Bld: 100 mg/dL — ABNORMAL HIGH (ref 70–99)
Potassium: 4 mEq/L (ref 3.7–5.3)
Sodium: 136 mEq/L — ABNORMAL LOW (ref 137–147)

## 2013-12-19 LAB — GLUCOSE, CAPILLARY
Glucose-Capillary: 101 mg/dL — ABNORMAL HIGH (ref 70–99)
Glucose-Capillary: 173 mg/dL — ABNORMAL HIGH (ref 70–99)
Glucose-Capillary: 176 mg/dL — ABNORMAL HIGH (ref 70–99)
Glucose-Capillary: 184 mg/dL — ABNORMAL HIGH (ref 70–99)
Glucose-Capillary: 94 mg/dL (ref 70–99)

## 2013-12-19 MED ORDER — FUROSEMIDE 10 MG/ML IJ SOLN
40.0000 mg | Freq: Every day | INTRAMUSCULAR | Status: DC
Start: 2013-12-19 — End: 2013-12-21
  Administered 2013-12-19 – 2013-12-20 (×2): 40 mg via INTRAVENOUS
  Filled 2013-12-19 (×3): qty 4

## 2013-12-19 NOTE — Progress Notes (Signed)
Patient Name: [name] [name] Date of Encounter: 12/19/2013     Active Problems:   CHF (congestive heart failure)   Heart failure    SUBJECTIVE  Still feel SOB whenever laying flat. Slept ok last night at an elevated angle. Denies any CP  CURRENT MEDS . aspirin EC  81 mg Oral Daily  . atorvastatin  80 mg Oral Daily  . furosemide  60 mg Intravenous Once  . heparin  5,000 Units Subcutaneous 3 times per day  . insulin aspart  0-15 Units Subcutaneous TID WC  . insulin aspart protamine- aspart  15 Units Subcutaneous BID WC  . sodium chloride  3 mL Intravenous Q12H  . sotalol  120 mg Oral BID    OBJECTIVE  Filed Vitals:   12/18/13 1834 12/18/13 2230 12/19/13 0535 12/19/13 0922  BP: 104/75 91/46 105/67 94/61  Pulse: 70 71 68 71  Temp: 97.4 F (36.3 C) 97.9 F (36.6 C) 97.2 F (36.2 C)   TempSrc:  Oral Oral   Resp: 20 20 20    Height:      Weight:  230 lb 9.6 oz (104.6 kg)    SpO2: 99% 97% 100% 97%    Intake/Output Summary (Last 24 hours) at 12/19/13 1109 Last data filed at 12/19/13 1024  Gross per 24 hour  Intake   1143 ml  Output    725 ml  Net    418 ml   Filed Weights   12/18/13 0352 12/18/13 1817 12/18/13 2230  Weight: 230 lb 1.6 oz (104.373 kg) 230 lb 9.6 oz (104.599 kg) 230 lb 9.6 oz (104.6 kg)    PHYSICAL EXAM  General: Pleasant, NAD. Neuro: Alert and oriented X 3. Moves all extremities spontaneously. Psych: Normal affect. HEENT:  Normal  Neck: Supple without bruits or JVD. Lungs:  Resp regular and unlabored, decreased breath sound bilaterally, mild rale near base Heart: RRR no s3, s4. 1/6 systolic murmur Abdomen: Soft, non-tender, non-distended, BS + x 4.  Extremities: No clubbing, cyanosis. DP/PT/Radials 2+ and equal bilaterally. No significant edema in LE  Accessory Clinical Findings  CBC  Recent Labs  12/16/13 1426  WBC 4.4  HGB 13.0  HCT 39.5  MCV 83.3  PLT 976   Basic Metabolic Panel  Recent Labs  12/18/13 0310  12/19/13 0425  NA 134* 136*  K 3.5* 4.0  CL 98 99  CO2 24 23  GLUCOSE 149* 100*  BUN 24* 27*  CREATININE 1.41* 1.36*  CALCIUM 8.7 9.2   Liver Function Tests  Recent Labs  12/16/13 1725  AST 22  ALT 21  ALKPHOS 75  BILITOT 0.5  PROT 7.1  ALBUMIN 2.9*   Cardiac Enzymes  Recent Labs  12/16/13 2109 12/17/13 0203 12/17/13 0745  TROPONINI <0.30 <0.30 <0.30    TELE NSR with HR 60-70s, occasional NSVT, longest run 13 beats    ECG  NSR with LBBB  Echocardiogram 05/05/2013  LV EF: 20% - 25%  ------------------------------------------------------------ Study Conclusions  - Left ventricle: There is akinesis of the apical anterior wall and mid anteroseptum There is severe hypokinesis to akinesis of the entire inferior wall There is akinesis of the mid inferoseptum and apical septum. The cavity size was normal. There was mild concentric hypertrophy. Systolic function was severely reduced. The estimated ejection fraction was in the range of 20% to 25%. Diffuse hypokinesis. There is akinesis of the entireapical myocardium. - Ventricular septum: Septal motion showed paradox. - Mitral valve: Mild regurgitation. - Left atrium: The atrium  was mildly dilated. - Right ventricle: The cavity size was mildly dilated. Wall thickness was normal. - Tricuspid valve: Mild regurgitation.      Radiology/Studies  Dg Chest 2 View  12/16/2013   CLINICAL DATA:  Shortness of breath started 3 days ago. No chest pain.  EXAM: CHEST  2 VIEW  COMPARISON:  05/21/2012  FINDINGS: Bilateral mild interstitial thickening. No pleural effusion or pneumothorax. Stable cardiomegaly. Single lead AICD. Unremarkable osseous structures.  IMPRESSION: Bilateral mild interstitial thickening which may reflect mild pulmonary edema versus pneumonitis secondary to an infectious or inflammatory etiology.   Electronically Signed   By: Kathreen Devoid   On: 12/16/2013 16:16   Dg Chest Port 1 View  12/18/2013    CLINICAL DATA:  Shortness of breath.  EXAM: PORTABLE CHEST - 1 VIEW  COMPARISON:  12/16/2013  FINDINGS: Pacing/ICD device shows stable positioning. Lungs show relatively stable mild interstitial edema/ CHF. No pleural effusions are identified. There is stable cardiomegaly.  IMPRESSION: Stable mild interstitial edema.   Electronically Signed   By: Aletta Edouard M.D.   On: 12/18/2013 08:26    ASSESSMENT AND PLAN  1. Acute on chronic systolic CHF  - currently -1L, still has SOB when laying back, consider adding another 40mg  IV lasix today. Possible transition to 40mg  PO lasix tomorrow before discharge  2. Hypotension  - continue to be hypotensive, home coreg and losartan off, currently on home sotalol.   - unclear cause for hypotension, Cr stable despite low BP  NSVT  - continue to have occasional NSVT overnight, mainly 3-5 beats, longest on 13 beats  3. [name]   4. Ischemic Cardiomyopathy with VT s/p single chamber ICD   - Echo 05/05/2013 EF 20-25%, akinesis of apical anterior wall and mid anteroseptum, severe hypokinesis to mid inferoseptum and apical septum, mild MR/TR.  5. Atrial Flutter s/p RFCA   - cont sotalol 120mg  po bid  6. HLD   - cont lipitor 80mg  po qd  7. IDDM   - cont home insulin 70/30 15U BID   - SSI  8. CKD  Signed, Almyra Deforest PA-C Pager: [phone]  History and all data above reviewed.  Patient examined.  I agree with the findings as above.  Still some SOB. No pain The patient exam reveals COR:RRR  ,  Lungs: clear  ,  Abd: Positive bowel sounds, no rebound no guarding, Ext No edema  Neck:  JVD 7 cm  .  All available labs, radiology testing, previous records reviewed. Agree with documented assessment and plan. Acute on chronic systolic HF:  Plan IV diuresis again today. Check BMET in the AM.  Jeneen Rinks Morayma [place]  12:09 PM  12/19/2013

## 2013-12-19 NOTE — Progress Notes (Signed)
Patient received 1 dose Lasix during afternoon shift.  MD notified. PM dose held tonight per Jules Husbands, MD.  RN will continue to monitor. Shellee Milo, RN

## 2013-12-19 NOTE — Plan of Care (Signed)
Problem: Phase I Progression Outcomes Goal: EF % per last Echo/documented,Core Reminder form on chart Outcome: Completed/Met Date Met:  12/19/13 Last Echo March 2015

## 2013-12-19 NOTE — Progress Notes (Signed)
Pt.is A/Ox4 and is ambulatory without assistance. Ronny Bacon, PA was called and notified about patient's BP and was c/o SOB. Was told to go ahead and give IV lasix and to recheck BP in an hour. Oncoming RN was notified.

## 2013-12-20 LAB — GLUCOSE, CAPILLARY
GLUCOSE-CAPILLARY: 144 mg/dL — AB (ref 70–99)
Glucose-Capillary: 118 mg/dL — ABNORMAL HIGH (ref 70–99)
Glucose-Capillary: 102 mg/dL — ABNORMAL HIGH (ref 70–99)

## 2013-12-20 LAB — BASIC METABOLIC PANEL
Anion gap: 14 (ref 5–15)
BUN: 26 mg/dL — ABNORMAL HIGH (ref 6–23)
CHLORIDE: 101 meq/L (ref 96–112)
CO2: 23 meq/L (ref 19–32)
Calcium: 9.4 mg/dL (ref 8.4–10.5)
Creatinine, Ser: 1.43 mg/dL — ABNORMAL HIGH (ref 0.50–1.35)
GFR calc Af Amer: 59 mL/min — ABNORMAL LOW (ref >90)
GFR calc non Af Amer: 51 mL/min — ABNORMAL LOW (ref >90)
GLUCOSE: 128 mg/dL — AB (ref 70–99)
Potassium: 4.3 mEq/L (ref 3.7–5.3)
SODIUM: 138 meq/L (ref 137–147)

## 2013-12-20 MED ORDER — FUROSEMIDE 10 MG/ML IJ SOLN
40.0000 mg | Freq: Once | INTRAMUSCULAR | Status: AC
  Administered 2013-12-20: 40 mg via INTRAVENOUS
  Filled 2013-12-20: qty 4

## 2013-12-20 NOTE — Progress Notes (Signed)
    SUBJECTIVE:  No distress, No pain   PHYSICAL EXAM Filed Vitals:   12/19/13 2145 12/20/13 0000 12/20/13 0311 12/20/13 0651  BP: 94/66 95/67 97/69  98/66  Pulse: 64 62 62 59  Temp: 98 F (36.7 C)     TempSrc: Oral     Resp: 19  18 18   Height:      Weight:    230 lb (104.327 kg)  SpO2: 99% 99% 100% 100%   General:  No distress Lungs:  Clear Neck:  No JVD Heart:  RRR Abdomen:  Positive bowel sounds, no rebound no guarding Extremities:  Trace edema  Neuro:  Nonfocal  LABS:  Results for orders placed during the hospital encounter of 12/16/13 (from the past 24 hour(s))  GLUCOSE, CAPILLARY     Status: Abnormal   Collection Time    12/19/13 10:47 AM      Result Value Ref Range   Glucose-Capillary 176 (*) 70 - 99 mg/dL  GLUCOSE, CAPILLARY     Status: Abnormal   Collection Time    12/19/13  5:10 PM      Result Value Ref Range   Glucose-Capillary 184 (*) 70 - 99 mg/dL  GLUCOSE, CAPILLARY     Status: Abnormal   Collection Time    12/19/13  9:43 PM      Result Value Ref Range   Glucose-Capillary 173 (*) 70 - 99 mg/dL   Comment 1 Notify RN     Comment 2 Documented in Chart    BASIC METABOLIC PANEL     Status: Abnormal   Collection Time    12/20/13  3:35 AM      Result Value Ref Range   Sodium 138  137 - 147 mEq/L   Potassium 4.3  3.7 - 5.3 mEq/L   Chloride 101  96 - 112 mEq/L   CO2 23  19 - 32 mEq/L   Glucose, Bld 128 (*) 70 - 99 mg/dL   BUN 26 (*) 6 - 23 mg/dL   Creatinine, Ser 1.43 (*) 0.50 - 1.35 mg/dL   Calcium 9.4  8.4 - 10.5 mg/dL   GFR calc non Af Amer 51 (*) >90 mL/min   GFR calc Af Amer 59 (*) >90 mL/min   Anion gap 14  5 - 15  GLUCOSE, CAPILLARY     Status: Abnormal   Collection Time    12/20/13  6:57 AM      Result Value Ref Range   Glucose-Capillary 144 (*) 70 - 99 mg/dL    Intake/Output Summary (Last 24 hours) at 12/20/13 0857 Last data filed at 12/20/13 0844  Gross per 24 hour  Intake   1323 ml  Output   2335 ml  Net  -1012 ml      ASSESSMENT AND PLAN:   ACUTE ON CHRONIC SYSTOLIC HF:  He reports significant breathlessness last night.    He was in NSR and his BP was OK.  However, he is anxious about going home.  I will continue IV diuresis today.    CAD:  I don't know if the event of last night could be ischemia.   I will schedule a Lexiscan Myoview.  NSVT:  No significant dysrhythmia.     CKD:  Creat is up slighlty.    Follow in AM.     Minus Breeding 12/20/2013 8:57 AM

## 2013-12-20 NOTE — Progress Notes (Signed)
Patient sitting up in bed, stating he is feeling short of breath.  Lung sounds clear and diminished to auscultation, oxygen saturation 99% on room air.  Placed patient on 2L of oxygen via nasal cannula, which patient states helped minimally.  MD on call notified, one time dose of furosemide ordered and administered (see MAR).  Patient resting comfortably in sitting position in bed.  Will continue to monitor.

## 2013-12-20 NOTE — Progress Notes (Signed)
Patient alert, oriented, no c/o shortness of breath or pain, v/s stable. Wife at bedside. SR on the monitor. Will continue to monitor patient.

## 2013-12-20 NOTE — Progress Notes (Signed)
Patient c/o SOB.  2 L O2 applied via [place].  Lung sounds clear.  RN will continue to monitor. Shellee Milo, RN

## 2013-12-20 NOTE — Progress Notes (Signed)
UR complete.  Giavonni Cizek RN, MSN 

## 2013-12-21 ENCOUNTER — Inpatient Hospital Stay (HOSPITAL_COMMUNITY): Payer: 59

## 2013-12-21 ENCOUNTER — Encounter: Payer: Self-pay | Admitting: Cardiology

## 2013-12-21 DIAGNOSIS — R0602 Shortness of breath: Secondary | ICD-10-CM

## 2013-12-21 LAB — GLUCOSE, CAPILLARY
GLUCOSE-CAPILLARY: 146 mg/dL — AB (ref 70–99)
Glucose-Capillary: 160 mg/dL — ABNORMAL HIGH (ref 70–99)
Glucose-Capillary: 227 mg/dL — ABNORMAL HIGH (ref 70–99)
Glucose-Capillary: 230 mg/dL — ABNORMAL HIGH (ref 70–99)
Glucose-Capillary: 166 mg/dL — ABNORMAL HIGH (ref 70–99)

## 2013-12-21 LAB — BASIC METABOLIC PANEL
Anion gap: 13 (ref 5–15)
BUN: 28 mg/dL — ABNORMAL HIGH (ref 6–23)
CHLORIDE: 101 meq/L (ref 96–112)
CO2: 25 meq/L (ref 19–32)
Calcium: 9.1 mg/dL (ref 8.4–10.5)
Creatinine, Ser: 1.4 mg/dL — ABNORMAL HIGH (ref 0.50–1.35)
GFR calc Af Amer: 60 mL/min — ABNORMAL LOW (ref >90)
GFR calc non Af Amer: 52 mL/min — ABNORMAL LOW (ref >90)
Glucose, Bld: 140 mg/dL — ABNORMAL HIGH (ref 70–99)
Potassium: 4.6 mEq/L (ref 3.7–5.3)
SODIUM: 139 meq/L (ref 137–147)

## 2013-12-21 MED ORDER — FUROSEMIDE 40 MG PO TABS
40.0000 mg | ORAL_TABLET | Freq: Every day | ORAL | Status: DC
  Administered 2013-12-21 – 2013-12-22 (×2): 40 mg via ORAL
  Filled 2013-12-21 (×2): qty 1

## 2013-12-21 MED ORDER — TECHNETIUM TC 99M SESTAMIBI - CARDIOLITE
30.0000 | Freq: Once | INTRAVENOUS | Status: AC | PRN
Start: 2013-12-21 — End: 2013-12-21
  Administered 2013-12-21: 16:00:00 30 via INTRAVENOUS

## 2013-12-21 MED ORDER — REGADENOSON 0.4 MG/5ML IV SOLN
INTRAVENOUS | Status: AC
Start: 2013-12-21 — End: 2013-12-21
  Filled 2013-12-21: qty 5

## 2013-12-21 MED ORDER — REGADENOSON 0.4 MG/5ML IV SOLN
0.4000 mg | Freq: Once | INTRAVENOUS | Status: AC
  Administered 2013-12-21: 0.4 mg via INTRAVENOUS
  Filled 2013-12-21: qty 5

## 2013-12-21 MED ORDER — TECHNETIUM TC 99M SESTAMIBI GENERIC - CARDIOLITE
10.0000 | Freq: Once | INTRAVENOUS | Status: AC | PRN
  Administered 2013-12-21: 10 via INTRAVENOUS

## 2013-12-21 NOTE — Progress Notes (Signed)
Patient ambulated to end of hall and back to room with minimal assist. Denies SOB. RN will continue to monitor. Kolyn Rozario, RN 

## 2013-12-21 NOTE — Progress Notes (Signed)
Contacted by nursing staff. Ok to d/c telemetry to take shower. Then will place back on telemetry  Signed, Almyra Deforest PA Pager: [phone]

## 2013-12-21 NOTE — Telephone Encounter (Signed)
Calling because father is still in the hospital, and they have concerns because they feel he is not getting better and they do not know what to do . Please call    Thanks

## 2013-12-21 NOTE — Progress Notes (Signed)
Lexiscan stress test completed without complication. Waiting final report by [facility] reader.  Hilbert Corrigan PA Pager: [phone]

## 2013-12-21 NOTE — Progress Notes (Signed)
Discussed with Dr. Harrington Challenger, epic system and radiology system down this afternoon. Per verbal report, EF 30%, no ischemia, however lots of scars. Pending final report tomorrow.  Hilbert Corrigan PA Pager: [phone]

## 2013-12-21 NOTE — Progress Notes (Signed)
Pt ambulated without O2. SPO2 was 100% sitting, prior to ambulating. SpO2 remained at 100% throughout; ambulated to opposite end of hall and back.

## 2013-12-21 NOTE — Progress Notes (Addendum)
    SUBJECTIVE:   No chest pain.  He reports that his breathing is not better.  However, sats on RA have been OK.     PHYSICAL EXAM Filed Vitals:   12/20/13 0651 12/20/13 1435 12/20/13 2059 12/21/13 0438  BP: 98/66 107/95 99/57 91/60   Pulse: 59 65 60 64  Temp:  97.7 F (36.5 C) 97.5 F (36.4 C) 97.6 F (36.4 C)  TempSrc:  Oral Oral Oral  Resp: 18 16 18 20   Height:      Weight: 230 lb (104.327 kg)   230 lb 6.1 oz (104.5 kg)  SpO2: 100% 99% 95% 100%   General:  No distress Lungs:  Clear Neck:  No JVD Heart:  RRR Abdomen:  Positive bowel sounds, no rebound no guarding Extremities:  Trace edema  Neuro:  Nonfocal  LABS:  Results for orders placed during the hospital encounter of 12/16/13 (from the past 24 hour(s))  GLUCOSE, CAPILLARY     Status: Abnormal   Collection Time    12/20/13 11:41 AM      Result Value Ref Range   Glucose-Capillary 118 (*) 70 - 99 mg/dL   Comment 1 Notify RN    GLUCOSE, CAPILLARY     Status: Abnormal   Collection Time    12/20/13  4:21 PM      Result Value Ref Range   Glucose-Capillary 102 (*) 70 - 99 mg/dL   Comment 1 Notify RN    GLUCOSE, CAPILLARY     Status: Abnormal   Collection Time    12/20/13  9:13 PM      Result Value Ref Range   Glucose-Capillary 160 (*) 70 - 99 mg/dL  BASIC METABOLIC PANEL     Status: Abnormal   Collection Time    12/21/13  5:00 AM      Result Value Ref Range   Sodium 139  137 - 147 mEq/L   Potassium 4.6  3.7 - 5.3 mEq/L   Chloride 101  96 - 112 mEq/L   CO2 25  19 - 32 mEq/L   Glucose, Bld 140 (*) 70 - 99 mg/dL   BUN 28 (*) 6 - 23 mg/dL   Creatinine, Ser 1.40 (*) 0.50 - 1.35 mg/dL   Calcium 9.1  8.4 - 10.5 mg/dL   GFR calc non Af Amer 52 (*) >90 mL/min   GFR calc Af Amer 60 (*) >90 mL/min   Anion gap 13  5 - 15  GLUCOSE, CAPILLARY     Status: Abnormal   Collection Time    12/21/13  6:13 AM      Result Value Ref Range   Glucose-Capillary 146 (*) 70 - 99 mg/dL   Comment 1 Notify RN      Intake/Output  Summary (Last 24 hours) at 12/21/13 0740 Last data filed at 12/21/13 0521  Gross per 24 hour  Intake   1040 ml  Output   1430 ml  Net   -390 ml     ASSESSMENT AND PLAN:   ACUTE ON CHRONIC SYSTOLIC HF:   Given IV diuresis again yesterday.  Change to PO.  O2 sats OK.  Ambulate and record O2 sats.     CAD:  Lexiscan Myoview today.    NSVT:  No significant dysrhythmia.     CKD:  Creat is stable     Minus Breeding 12/21/2013 7:40 AM

## 2013-12-22 LAB — GLUCOSE, CAPILLARY
GLUCOSE-CAPILLARY: 147 mg/dL — AB (ref 70–99)
Glucose-Capillary: 123 mg/dL — ABNORMAL HIGH (ref 70–99)
Glucose-Capillary: 135 mg/dL — ABNORMAL HIGH (ref 70–99)

## 2013-12-22 LAB — PRO B NATRIURETIC PEPTIDE: Pro B Natriuretic peptide (BNP): 1806 pg/mL — ABNORMAL HIGH (ref 0–125)

## 2013-12-22 MED ORDER — FUROSEMIDE 40 MG PO TABS: 40.0000 mg | ORAL_TABLET | Freq: Every day | ORAL | Status: DC

## 2013-12-22 MED ORDER — ASPIRIN 81 MG PO TBEC: 81.0000 mg | DELAYED_RELEASE_TABLET | Freq: Every day | ORAL | Status: DC

## 2013-12-22 NOTE — Progress Notes (Signed)
    SUBJECTIVE:   Discussed events with nursing.  He complained of SOB again last night.  The patient gets SOB at the same time every night.  EKG was OK during the event.  Sats were 99% at the time of the complaints.  The nurse reports that he does not have tachypnea at that time.  He has no pain.  He has no arrhythmia. BP is OK.  He is able to have a conversation.  She says that he does not look anxious when this happens.    PHYSICAL EXAM Filed Vitals:   12/21/13 0925 12/21/13 0926 12/21/13 1300 12/21/13 2037  BP: 103/66 108/61 98/74 101/72  Pulse: 66 64 64 66  Temp:   97.6 F (36.4 C) 98 F (36.7 C)  TempSrc:   Oral Oral  Resp: 20 20 20 18   Height:      Weight:      SpO2:   99% 100%   General:  No distress Lungs:  Clear Neck:  No JVD Heart:  RRR Abdomen:  Positive bowel sounds, no rebound no guarding Extremities:  Trace edema  Neuro:  Nonfocal  LABS:  Results for orders placed during the hospital encounter of 12/16/13 (from the past 24 hour(s))  GLUCOSE, CAPILLARY     Status: Abnormal   Collection Time    12/21/13 11:41 AM      Result Value Ref Range   Glucose-Capillary 227 (*) 70 - 99 mg/dL   Comment 1 Notify RN    GLUCOSE, CAPILLARY     Status: Abnormal   Collection Time    12/21/13  4:12 PM      Result Value Ref Range   Glucose-Capillary 230 (*) 70 - 99 mg/dL   Comment 1 Notify RN    GLUCOSE, CAPILLARY     Status: Abnormal   Collection Time    12/21/13  9:25 PM      Result Value Ref Range   Glucose-Capillary 166 (*) 70 - 99 mg/dL   Comment 1 Notify RN      Intake/Output Summary (Last 24 hours) at 12/22/13 0630 Last data filed at 12/22/13 0036  Gross per 24 hour  Intake    350 ml  Output   1120 ml  Net   -770 ml     ASSESSMENT AND PLAN:   ACUTE ON CHRONIC SYSTOLIC HF:    Sats have been 99 - 100% on RA routinely.  No evidence of volume overload.    CAD:   Awaiting results of Lexiscan Myoview.  If this is not high risk I plan on sending him home.  He  will need to follow with his primary care MD to evaluate this sleep disturbance.   NSVT:  No significant dysrhythmia.     CKD:  Creat is pending    Minus Breeding 12/22/2013 6:30 AM

## 2013-12-22 NOTE — Discharge Summary (Signed)
Physician Discharge Summary     Cardiologist: [name]  PCP: Tamsen Roers, MD   Patient ID: [name] [name] MRN: [id number] DOB/AGE: December 25, 1950 63 y.o.  Admit date: 12/16/2013 Discharge date: 12/22/2013  Admission Diagnoses:  Acute on chronic congestive HF  Discharge Diagnoses:  Active Problems:   Acute on chronic CHF (congestive heart failure)   NSVT   CAD   CKD   Aflutter   HLD   IDDM   Hypotension  Discharged Condition: stable  Hospital Course:   [name] [name] is a pleasant 63 y.o male, pt of Dr. [name], with ICM, HFrEF, CAD s/p PCI to RCA and LAD, VT s/p single chamber ICD for secondary prevention, AFL, moderate [name], IDDM, HTN, HLD, CKD who presented with chief complaint of dyspnea.   Pt presented with his daughter and wife. He is able to provide accurate HPI. Pt has had progressive dyspnea starting on Wednesday. PND, orthopnea and DOE. Denied fever. Had clear sputum production and mild cough. Cough is worse with lying flat. No sick contact. Complaints of chills. No chest pain or palpitations. No diarrhea or constipation. His urine output has decreased with the home lasix dose of 20mg . He has noticed decreasing urine output. Denied eating a high salt meal and has not changed his fluid intake. His weight has increased 6 lbs since Tuesday. His labs showed Na 136, Cr 1.23 (baseline 1-1.2), BNP 1744. Lactic acid 1.98, glc 266. LFTs are pending. CXR with mild interstitial edema. ECG with NSR, 1st deg AVB, p-mitrale, inf and ant q waves.   Pt last saw Dr. [name] on 04/17/2013. LHC 7/06:  EF 35-40%, mLAD 99%, dLAD 90%, mid to dist CFX 70%, RCA occluded. PCI: DES to mid and dist LAD. Carotids 4/12: 0-39% bilat ICA. Myovue 5/13 EF 25, septal, apical and inferior infarct; no ischemia. Latest ECHO 05/05/2013 - EF 20-25%.   The patient was admitted to the [facility] unit and started on IV lasix 40mg .  Sotalol, insulin and lipitor were continued.  Coreg dose was decreased to 3.125mg  BID.  Losartan  continued.  He continued to be hypotensive so Coreg and ARB were stopped.   IV lasix was continued.  Telemetry revealed NSVT the longest run was 13 beats.   On 1021 at 0345hrs the patient complained of being SOB while in bed.  O2 sats were 99% on room air and lungs were clear according to the RN.  One dose of IV lasix was ordered by MD.  [name] [name] revealed no ischemia.  EF 30% which correlates with March Echo.  He ultimately diuresed 3 liters and was placed back on 40mg  of PO lasix daily.  SCr remained stable at ~1.40.  The patient was seen by Dr. Percival Spanish who felt he was stable for DC home.  Sleep disturbance will need to be followed up by PCP.      Consults: None  Significant Diagnostic Studies:   EXAM:  PORTABLE CHEST - 1 VIEW  COMPARISON: 12/16/2013  FINDINGS:  Pacing/ICD device shows stable positioning. Lungs show relatively  stable mild interstitial edema/ CHF. No pleural effusions are  identified. There is stable cardiomegaly.  IMPRESSION:  Stable mild interstitial edema.  Electronically Signed  By: Aletta Edouard M.D.  On: 12/18/2013 08:26   MYOCARDIAL IMAGING WITH SPECT (REST AND PHARMACOLOGIC-STRESS)  GATED LEFT VENTRICULAR WALL MOTION STUDY  LEFT VENTRICULAR EJECTION FRACTION  TECHNIQUE:  Standard myocardial SPECT imaging was performed after resting  intravenous injection of 10 mCi Tc-12m sestamibi. Subsequently,  intravenous infusion of Lexiscan  was performed under the supervision  of the Cardiology staff. At peak effect of the drug, 30 mCi Tc-59m  sestamibi was injected intravenously and standard myocardial SPECT  imaging was performed. Quantitative gated imaging was also performed  to evaluate left ventricular wall motion, and estimate left  ventricular ejection fraction.  COMPARISON: None.  FINDINGS:  Perfusion: There is a large area of scar, fixed seen at both rest  and stress along the apical, entire inferoseptal wall, inferolateral  wall, distal  anteroseptal wall. There is no significant ischemia  identified.  Wall Motion: Generalized decreased left ventricular systolic  function with mild sparing of the anterior wall distribution.  Left Ventricular Ejection Fraction: 30 %  End diastolic volume 119 ml  End systolic volume 417 ml  IMPRESSION:  1. Large infarct along the apical, entire inferoseptal wall,  inferolateral wall as well as distal anteroseptal wall. No ischemia  identified.  2. Severely decreased ejection fraction of 30% with generalized  decreased left ventricular systolic function with mild sparing of  the anterior wall contractility.  3. Left ventricular ejection fraction 30%  4. Intermediate/High-risk stress test findings*. This is based upon  large area of infarct and decreased ejection fraction. Note, there  is no evidence of reversible ischemia/peri-infarct ischemia  identified.  Treatments:  See above  Discharge Exam: Blood pressure 90/64, pulse 61, temperature 98 F (36.7 C), temperature source Oral, resp. rate 20, height 5\' 11"  (1.803 m), weight 230 lb 9.6 oz (104.6 kg), SpO2 98.00%.   Disposition: 01-Home or Self Care      Discharge Instructions   Diet - low sodium heart healthy    Complete by:  As directed      Discharge instructions    Complete by:  As directed   Monitor your weight every morning.  If you gain 3 pounds in 24 hours, or 5 pounds in a week, call the office for instructions.     Increase activity slowly    Complete by:  As directed             Medication List    STOP taking these medications       carvedilol 6.25 MG tablet  Commonly known as:  COREG     losartan 50 MG tablet  Commonly known as:  COZAAR      TAKE these medications       aspirin 81 MG EC tablet  Take 1 tablet (81 mg total) by mouth daily.     atorvastatin 80 MG tablet  Commonly known as:  LIPITOR  Take 1 tablet (80 mg total) by mouth daily.     furosemide 40 MG tablet  Commonly known as:  LASIX    Take 1 tablet (40 mg total) by mouth daily.     insulin NPH-regular Human (70-30) 100 UNIT/ML injection  Commonly known as:  NOVOLIN 70/30 RELION  Inject 15 Units into the skin 2 (two) times daily with a meal.     magnesium oxide 400 MG tablet  Commonly known as:  MAG-OX  Take 1 tablet (400 mg total) by mouth daily.     MULTIVITAMIN PO  Take 1 capsule by mouth daily.     sotalol 120 MG tablet  Commonly known as:  BETAPACE  Take 120 mg by mouth 2 (two) times daily.       Follow-up Information   Follow up with Erlene Quan, PA-C On 01/08/2014. (11:30 AM)    Specialty:  Cardiology   Contact information:  [address] [place] [address] [phone]       Follow up with Tamsen Roers, MD. (Call for appt.)    Specialty:  Family Medicine   Contact information:   [address] [place] [address] [phone]      Greater than 30 minutes was spent completing the patient's discharge.   SignedTarri Fuller, [place] 12/22/2013, 4:00 PM  Patient seen and examined.  Plan as discussed in my rounding note for today and outlined above. Minus Breeding  12/22/2013  5:58 PM

## 2013-12-22 NOTE — Progress Notes (Signed)
EKG obtained per MD order. RN will continue to monitor. Shellee Milo, RN

## 2013-12-22 NOTE — Progress Notes (Signed)
Patient sitting up in bed, stating he is feeling short of breath. Lung sounds clear. O2 stats 99% on room air. Placed patient on 2L of oxygen via nasal cannula, which patient states helped minimally. MD on call notified.  RN will continue to monitor. Shellee Milo, RN

## 2013-12-29 ENCOUNTER — Telehealth: Payer: Self-pay | Admitting: Cardiology

## 2013-12-29 NOTE — Telephone Encounter (Signed)
Left message for pt to call.

## 2013-12-29 NOTE — Telephone Encounter (Signed)
This encounter was created in error - please disregard.

## 2013-12-29 NOTE — Telephone Encounter (Signed)
Please call,pt feet and legs are swollen. Pt have gained 5lbs since last week,a little sob when he lays down.

## 2013-12-29 NOTE — Telephone Encounter (Signed)
The daughter said it was ok to call the wife back

## 2013-12-29 NOTE — Telephone Encounter (Signed)
Discussed with dr hochrein, explained to patient to take the 60 mg of the furosemide. He had SOB when lying flat in the hosp and they could not find a cause. He will cont to watch his salt, sodium and fluid intake. He is aware of his f/u appt 01-08-14. He will call with problems prior to that appt.

## 2013-12-29 NOTE — Telephone Encounter (Signed)
Unable to reach dtr or leave a message

## 2013-12-29 NOTE — Telephone Encounter (Signed)
[name] [name] his daughter called in concerned about [name] stating that he been retaining fluid in his legs, feet, and stomach. And she stated that he has been having some SOB as well. She stated that the lasix is not working . She would like for him to be seen ASAP.

## 2013-12-29 NOTE — Telephone Encounter (Signed)
Spoke with pt, he is c/o swelling in his feet and ankles. He denies cp or SOB with exertion but states he is having trouble sleeping His weight is up 6 lbs since discharge from the hospital. He was seen by his primary care yesterday and his furosemide was increased to 60 mg once daily. Ardeen Fillers has taken only 40 mg this morning and will take another 20 mg this afternoon. Instructed patient to take the full 60 mg now. He is watching his salt, sodium and fluid intake. Will discuss with dr hochrein(DOD)

## 2014-01-04 ENCOUNTER — Telehealth: Payer: Self-pay | Admitting: Cardiology

## 2014-01-04 NOTE — Telephone Encounter (Signed)
Spoke with pt dtr,he is having pain and tingling in his feet. His feet cont to be swollen, but he is keeping them elevated. They will try tylenol, support hose. They will also contact the medical doctor regarding his pain. He has a follow up appt Monday.

## 2014-01-04 NOTE — Telephone Encounter (Signed)
Heath Lark called in stating that [name] [name] has been complaining of extreme pain in his feet due to a lot of fluid build up and she would like to know what to do. She is considering taking him to the ER. Please call  Thanks

## 2014-01-08 ENCOUNTER — Ambulatory Visit (INDEPENDENT_AMBULATORY_CARE_PROVIDER_SITE_OTHER): Payer: 59 | Admitting: Cardiology

## 2014-01-08 ENCOUNTER — Encounter: Payer: Self-pay | Admitting: Cardiology

## 2014-01-08 VITALS — BP 114/86 | HR 65 | Ht 70.0 in | Wt 251.6 lb

## 2014-01-08 DIAGNOSIS — Z95810 Presence of automatic (implantable) cardiac defibrillator: Secondary | ICD-10-CM

## 2014-01-08 DIAGNOSIS — Z79899 Other long term (current) drug therapy: Secondary | ICD-10-CM

## 2014-01-08 DIAGNOSIS — E1121 Type 2 diabetes mellitus with diabetic nephropathy: Secondary | ICD-10-CM

## 2014-01-08 DIAGNOSIS — I25110 Atherosclerotic heart disease of native coronary artery with unstable angina pectoris: Secondary | ICD-10-CM

## 2014-01-08 DIAGNOSIS — N183 Chronic kidney disease, stage 3 unspecified: Secondary | ICD-10-CM

## 2014-01-08 DIAGNOSIS — I2510 Atherosclerotic heart disease of native coronary artery without angina pectoris: Secondary | ICD-10-CM | POA: Insufficient documentation

## 2014-01-08 DIAGNOSIS — I255 Ischemic cardiomyopathy: Secondary | ICD-10-CM

## 2014-01-08 MED ORDER — METOLAZONE 2.5 MG PO TABS: ORAL_TABLET | ORAL | Status: DC

## 2014-01-08 MED ORDER — FUROSEMIDE 80 MG PO TABS: 80.0000 mg | ORAL_TABLET | Freq: Every day | ORAL | Status: DC

## 2014-01-08 NOTE — Assessment & Plan Note (Signed)
Last SCr 1.4

## 2014-01-08 NOTE — Progress Notes (Signed)
01/08/2014 [name] [name]   [birth year]  [id number]  Primary Physician Tamsen Roers, MD Primary Cardiologist: Dr [name]  HPI:  63 y.o male, pt of Dr. [name], with ICM, EF 20-25% by echo March 2015, CAD s/p PCI to RCA and LAD 2002, and 2007, VT s/p single chamber ICD for secondary prevention, PAF- on Betapace, IDDM, HTN, HLD, CKD stage 3,who was admitted 10/17-10/23/15 with acute on chronic CHF. He says his good wgt is 222 though he hasn't been that wgt for several months. . He was discharged at 230. Today he is 251. He reports increasing edema and dyspnea. He denies orthopnea. His PCP increased his Lasix from 80 mg daily to 120 mg over the weekend but he noticed no change.    Current Outpatient Prescriptions  Medication Sig Dispense Refill  . aspirin EC 81 MG EC tablet Take 1 tablet (81 mg total) by mouth daily.    Marland Kitchen atorvastatin (LIPITOR) 80 MG tablet Take 1 tablet (80 mg total) by mouth daily. 90 tablet 3  . insulin NPH-insulin regular (NOVOLIN 70/30 RELION) (70-30) 100 UNIT/ML injection Inject 15 Units into the skin 2 (two) times daily with a meal. 10 mL 12  . magnesium oxide (MAG-OX) 400 MG tablet Take 1 tablet (400 mg total) by mouth daily. 90 tablet 3  . Multiple Vitamins-Minerals (MULTIVITAMIN PO) Take 1 capsule by mouth daily.     . sotalol (BETAPACE) 120 MG tablet Take 120 mg by mouth 2 (two) times daily.    . furosemide (LASIX) 80 MG tablet Take 1 tablet (80 mg total) by mouth daily. 90 tablet 3  . metolazone (ZAROXOLYN) 2.5 MG tablet Take 1 tablet 30 mins before taking furosemide 15 tablet 0   No current facility-administered medications for this visit.    Allergies  Allergen Reactions  . Penicillins Other (See Comments)    Unknown reaction    History   Social History  . Marital Status: Married    Spouse Name: N/A    Number of Children: 3  . Years of Education: N/A   Occupational History  . brick layer - currently unemployed    Social History Main Topics    . Smoking status: Former Smoker    Quit date: 03/02/2000  . Smokeless tobacco: Not on file  . Alcohol Use: No  . Drug Use: No  . Sexual Activity: Not on file   Other Topics Concern  . Not on file   Social History Narrative   Pt lives with wife. He hunts regularly and is active, walking > 1 mile at times without difficulty.     Review of Systems: General: negative for chills, fever, night sweats or weight changes.  Cardiovascular: negative for chest pain, dyspnea on exertion, edema, orthopnea, palpitations, paroxysmal nocturnal dyspnea or shortness of breath Dermatological: negative for rash Respiratory: negative for cough or wheezing Urologic: negative for hematuria Abdominal: negative for nausea, vomiting, diarrhea, bright red blood per rectum, melena, or hematemesis Neurologic: negative for visual changes, syncope, or dizziness All other systems reviewed and are otherwise negative except as noted above.    Blood pressure 114/86, pulse 65, height 5\' 10"  (1.778 m), weight 251 lb 9.6 oz (114.125 kg).  General appearance: alert, cooperative, no distress and moderately obese Neck: elevated JVP Lungs: clear to auscultation bilaterally Heart: regular rate and rhythm Extremities: 1+ pre tibial edema    ASSESSMENT AND PLAN:   Acute on chronic systolic heart failure Discharged 12/22/13 but still volume overloaded, SOB, edematous.  Wgt today 251, discharge wgt 236, he says stable wgt is 222.   PAF (paroxysmal atrial fibrillation) NSR on Betapace  Type 2 diabetes with nephropathy Stage 3 CRI  CAD (coronary artery disease), native coronary artery PCI 2002, 2006. 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 He'll return later this week. If he is diuresing we could change his metolazone to 2-3 times a week. He is not on K+ supplement and his K+ was 4.6 at discharge so I did not add K+. Supplement. He'll need a BMP at follow up.   [name] KPA-C 01/08/2014 12:08 PM

## 2014-01-08 NOTE — Assessment & Plan Note (Signed)
MDT ICD 2007

## 2014-01-08 NOTE — Assessment & Plan Note (Signed)
EF  20-25% by echo March 2015, 30% by [facility] Oct 2015

## 2014-01-08 NOTE — Assessment & Plan Note (Signed)
Stage 3 CRI 

## 2014-01-08 NOTE — Assessment & Plan Note (Signed)
NSR on Betapace

## 2014-01-08 NOTE — Assessment & Plan Note (Signed)
Discharged 12/22/13 but still volume overloaded, SOB, edematous.  Wgt today 251, discharge wgt 236, he says stable wgt is 222.

## 2014-01-08 NOTE — Assessment & Plan Note (Signed)
PCI 2002, 2006. Myoview Oct 2015- scar, no ischemia

## 2014-01-08 NOTE — Patient Instructions (Signed)
Your physician has recommended you make the following change in your medication: decrease the furosemide to 80 mg daily. Start new prescription for metolazone 2.5 mg as directed on the bottle.  Your physician recommends that you schedule a follow-up appointment this Friday the 13th with Tarri Fuller PA-C.  Your physician recommends that you return for lab work prior to appointment with Gaspar Bidding.

## 2014-01-12 ENCOUNTER — Ambulatory Visit (INDEPENDENT_AMBULATORY_CARE_PROVIDER_SITE_OTHER): Payer: 59 | Admitting: Physician Assistant

## 2014-01-12 ENCOUNTER — Encounter: Payer: Self-pay | Admitting: Physician Assistant

## 2014-01-12 VITALS — BP 109/76 | HR 61 | Ht 71.0 in | Wt 236.7 lb

## 2014-01-12 DIAGNOSIS — I10 Essential (primary) hypertension: Secondary | ICD-10-CM

## 2014-01-12 DIAGNOSIS — I5023 Acute on chronic systolic (congestive) heart failure: Secondary | ICD-10-CM

## 2014-01-12 DIAGNOSIS — I2510 Atherosclerotic heart disease of native coronary artery without angina pectoris: Secondary | ICD-10-CM

## 2014-01-12 DIAGNOSIS — N183 Chronic kidney disease, stage 3 unspecified: Secondary | ICD-10-CM

## 2014-01-12 LAB — BASIC METABOLIC PANEL
BUN: 24 mg/dL — ABNORMAL HIGH (ref 6–23)
CO2: 30 mEq/L (ref 19–32)
Calcium: 9.4 mg/dL (ref 8.4–10.5)
Chloride: 100 mEq/L (ref 96–112)
Creat: 1.02 mg/dL (ref 0.50–1.35)
Glucose, Bld: 90 mg/dL (ref 70–99)
Potassium: 3.9 mEq/L (ref 3.5–5.3)
Sodium: 137 mEq/L (ref 135–145)

## 2014-01-12 MED ORDER — METOLAZONE 2.5 MG PO TABS: ORAL_TABLET | ORAL | Status: DC

## 2014-01-12 NOTE — Assessment & Plan Note (Signed)
Patient still appears volume overloaded however, he has dropped considerable amount of weight in the last 5 days with the addition of metolazone.  I'm going to decrease that to Monday Wednesday Friday. The patient was instructed to take it as needed once he reaches his base weight which is approximately 222#.  He is a basic metabolic panel yesterday which showed his potassium was normal and had improvement in serum creatinine. It sounds as though the patient is very diligent at watching what he eats writing it down and checking his weight daily. I reemphasized this routine.

## 2014-01-12 NOTE — Assessment & Plan Note (Signed)
Blood pressure is on the lower side of normal. He will not tolerate addition of an Ace or ARB.

## 2014-01-12 NOTE — Assessment & Plan Note (Signed)
Myoview October 2015 showed no ischemia EF 30%.

## 2014-01-12 NOTE — Assessment & Plan Note (Signed)
Improved serum creatinine with yesterday's labs

## 2014-01-12 NOTE — Patient Instructions (Addendum)
Take metolazone M, W, F.  When you get to your base weight 222#, stop the metolazone and take it only as needed for weight gain.   Example: Monitor your weight every morning.  If you gain 3 pounds in 24 hours, or 5 pounds in a week, call the office for instructions.     Your physician recommends that you schedule a follow-up appointment in: 3 Months with Dr [name]

## 2014-01-12 NOTE — Assessment & Plan Note (Signed)
On a statin 

## 2014-01-12 NOTE — Progress Notes (Signed)
Date:  01/12/2014   ID:  [name], DOB 10/12/1950, MRN [id number]  PCP:  Tamsen Roers, MD  Primary Cardiologist:  [name]   History of Present Illness: [name] [name] is a 63 y.o. male  pt of Dr. [name], with ICM, EF 20-25% by echo March 2015, CAD s/p PCI to RCA and LAD 2002, and 2007, VT s/p single chamber ICD for secondary prevention, PAF- on Betapace, IDDM, HTN, HLD, CKD stage 3,who was admitted 10/17-10/23/15 with acute on chronic CHF. He says his good wgt is between 218 and 222 though he hasn't been that wgt for several months. . He was discharged at 230. He was seen by the [facility] on October 9 his weight was 251 pounds. He added metolazone 2.5 mg and decreased his Lasix. Patient presents today for evaluation. His weight has decreased to 236 pounds and had a basic metabolic panel yesterday which shows improved serum creatinine and normal potassium. He has noticed improvement in the lower extremity edema but not resolution and still complains of orthopnea and PND. The patient currently denies nausea, vomiting, fever, chest pain, dizziness, cough, congestion, abdominal pain, hematochezia, melena, claudication.  Wt Readings from Last 3 Encounters:  01/12/14 236 lb 11.2 oz (107.366 kg)  01/08/14 251 lb 9.6 oz (114.125 kg)  12/22/13 230 lb 9.6 oz (104.6 kg)     Past Medical History  Diagnosis Date  . DM type 2 (diabetes mellitus, type 2)   . Ischemic cardiomyopathy March 2015    20-25% 2D   . HTN (hypertension)   . Hypercholesteremia     ablation  . Ventricular tachycardia   . CAD (coronary artery disease) 6720,9470 X 2     RCA-T, 70% PL (off CFX), 99% Prox LAD/90% Dist LAD, S/P TAXUS stent x 2  . ICD (implantable cardiac defibrillator) in place   . CKD (chronic kidney disease)   . Nephrolithiasis   . Atrial flutter 04/2012    s/p TEE-EPS+RFCA 04/2012  . Chronic anticoagulation   . Chronic systolic heart failure     Current Outpatient Prescriptions  Medication  Sig Dispense Refill  . aspirin EC 81 MG EC tablet Take 1 tablet (81 mg total) by mouth daily.    Marland Kitchen atorvastatin (LIPITOR) 80 MG tablet Take 1 tablet (80 mg total) by mouth daily. 90 tablet 3  . furosemide (LASIX) 80 MG tablet Take 1 tablet (80 mg total) by mouth daily. 90 tablet 3  . insulin NPH-insulin regular (NOVOLIN 70/30 RELION) (70-30) 100 UNIT/ML injection Inject 15 Units into the skin 2 (two) times daily with a meal. 10 mL 12  . magnesium oxide (MAG-OX) 400 MG tablet Take 1 tablet (400 mg total) by mouth daily. 90 tablet 3  . metolazone (ZAROXOLYN) 2.5 MG tablet Take 1 tablet 30 mins before taking furosemide 30 tablet 0  . Multiple Vitamins-Minerals (MULTIVITAMIN PO) Take 1 capsule by mouth daily.     . sotalol (BETAPACE) 120 MG tablet Take 120 mg by mouth 2 (two) times daily.     No current facility-administered medications for this visit.    Allergies:    Allergies  Allergen Reactions  . Penicillins Other (See Comments)    Unknown reaction    Social History:  The patient  reports that he quit smoking about 13 years ago. He does not have any smokeless tobacco history on file. He reports that he does not drink alcohol or use illicit drugs.   Family history:  History reviewed. No pertinent family  history.  ROS:  Please see the history of present illness.  All other systems reviewed and negative.   PHYSICAL EXAM: VS:  BP 109/76 mmHg  Pulse 61  Ht 5\' 11"  (1.803 m)  Wt 236 lb 11.2 oz (107.366 kg)  BMI 33.03 kg/m2 Well nourished, well developed, in no acute distress HEENT: Pupils are equal round react to light accommodation extraocular movements are intact.  Neck: no JVDNo cervical lymphadenopathy. Cardiac: Regular rate and rhythm without murmurs rubs or gallops. Lungs:  clear to auscultation bilaterally, no wheezing, rhonchi or rales Abd: soft, nontender, positive bowel sounds all quadrants, no hepatosplenomegaly Ext: 2+ lower extremity edema.  2+ radial and dorsalis pedis  pulses. Skin: warm and dry Neuro:  Grossly normal     ASSESSMENT AND PLAN:  Problem List Items Addressed This Visit    Acute on chronic systolic heart failure    Patient still appears volume overloaded however, he has dropped considerable amount of weight in the last 5 days with the addition of metolazone.  I'm going to decrease that to Monday Wednesday Friday. The patient was instructed to take it as needed once he reaches his base weight which is approximately 222#.  He is a basic metabolic panel yesterday which showed his potassium was normal and had improvement in serum creatinine. It sounds as though the patient is very diligent at watching what he eats writing it down and checking his weight daily. I reemphasized this routine.    Relevant Medications      metolazone (ZAROXOLYN) tablet   CAD (coronary artery disease), native coronary artery    Myoview October 2015 showed no ischemia EF 30%.    Relevant Medications      metolazone (ZAROXOLYN) tablet   Chronic renal disease, stage III    Improved serum creatinine with yesterday's labs    HYPERTENSION, BENIGN SYSTEMIC - Primary    Blood pressure is on the lower side of normal. He will not tolerate addition of an Ace or ARB.    Relevant Medications      metolazone (ZAROXOLYN) tablet

## 2014-01-18 ENCOUNTER — Ambulatory Visit (INDEPENDENT_AMBULATORY_CARE_PROVIDER_SITE_OTHER): Payer: 59 | Admitting: *Deleted

## 2014-01-18 ENCOUNTER — Encounter: Payer: 59 | Admitting: Internal Medicine

## 2014-01-18 DIAGNOSIS — I255 Ischemic cardiomyopathy: Secondary | ICD-10-CM

## 2014-01-18 LAB — MDC_IDC_ENUM_SESS_TYPE_INCLINIC
Brady Statistic RV Percent Paced: 0 %
Date Time Interrogation Session: 20151119095231
HIGH POWER IMPEDANCE MEASURED VALUE: 42 Ohm
HIGH POWER IMPEDANCE MEASURED VALUE: 43 Ohm
HIGH POWER IMPEDANCE MEASURED VALUE: 44 Ohm
HIGH POWER IMPEDANCE MEASURED VALUE: 44 Ohm
HIGH POWER IMPEDANCE MEASURED VALUE: 45 Ohm
HIGH POWER IMPEDANCE MEASURED VALUE: 53 Ohm
HIGH POWER IMPEDANCE MEASURED VALUE: 53 Ohm
HIGH POWER IMPEDANCE MEASURED VALUE: 53 Ohm
HIGH POWER IMPEDANCE MEASURED VALUE: 55 Ohm
HIGH POWER IMPEDANCE MEASURED VALUE: 58 Ohm
HIGH POWER IMPEDANCE MEASURED VALUE: 59 Ohm
HIGH POWER IMPEDANCE MEASURED VALUE: 59 Ohm
HIGH POWER IMPEDANCE MEASURED VALUE: 61 Ohm
HighPow Impedance: 40 Ohm
HighPow Impedance: 41 Ohm
HighPow Impedance: 41 Ohm
HighPow Impedance: 42 Ohm
HighPow Impedance: 43 Ohm
HighPow Impedance: 44 Ohm
HighPow Impedance: 44 Ohm
HighPow Impedance: 45 Ohm
HighPow Impedance: 46 Ohm
HighPow Impedance: 46 Ohm
HighPow Impedance: 49 Ohm
HighPow Impedance: 53 Ohm
HighPow Impedance: 55 Ohm
HighPow Impedance: 55 Ohm
HighPow Impedance: 56 Ohm
HighPow Impedance: 57 Ohm
HighPow Impedance: 58 Ohm
HighPow Impedance: 61 Ohm
Lead Channel Impedance Value: 352 Ohm
Lead Channel Impedance Value: 356 Ohm
Lead Channel Impedance Value: 360 Ohm
Lead Channel Impedance Value: 360 Ohm
Lead Channel Impedance Value: 360 Ohm
Lead Channel Impedance Value: 368 Ohm
Lead Channel Impedance Value: 368 Ohm
Lead Channel Impedance Value: 368 Ohm
Lead Channel Impedance Value: 368 Ohm
Lead Channel Impedance Value: 368 Ohm
Lead Channel Impedance Value: 368 Ohm
Lead Channel Sensing Intrinsic Amplitude: 4.7 mV
Lead Channel Sensing Intrinsic Amplitude: 4.7 mV
Lead Channel Sensing Intrinsic Amplitude: 5.1 mV
Lead Channel Sensing Intrinsic Amplitude: 5.1 mV
Lead Channel Sensing Intrinsic Amplitude: 5.1 mV
Lead Channel Sensing Intrinsic Amplitude: 5.3 mV
Lead Channel Sensing Intrinsic Amplitude: 5.3 mV
Lead Channel Sensing Intrinsic Amplitude: 5.3 mV
Lead Channel Sensing Intrinsic Amplitude: 5.4 mV
Lead Channel Setting Pacing Amplitude: 2.5 V
Lead Channel Setting Sensing Sensitivity: 0.3 mV
MDC IDC MSMT BATTERY VOLTAGE: 2.62 V
MDC IDC MSMT LEADCHNL RV IMPEDANCE VALUE: 356 Ohm
MDC IDC MSMT LEADCHNL RV IMPEDANCE VALUE: 368 Ohm
MDC IDC MSMT LEADCHNL RV IMPEDANCE VALUE: 368 Ohm
MDC IDC MSMT LEADCHNL RV IMPEDANCE VALUE: 368 Ohm
MDC IDC MSMT LEADCHNL RV SENSING INTR AMPL: 4.8 mV
MDC IDC MSMT LEADCHNL RV SENSING INTR AMPL: 4.8 mV
MDC IDC MSMT LEADCHNL RV SENSING INTR AMPL: 4.9 mV
MDC IDC MSMT LEADCHNL RV SENSING INTR AMPL: 5.2 mV
MDC IDC MSMT LEADCHNL RV SENSING INTR AMPL: 5.6 mV
MDC IDC MSMT LEADCHNL RV SENSING INTR AMPL: 5.7 mV
MDC IDC SET LEADCHNL RV PACING PULSEWIDTH: 0.4 ms
MDC IDC SET ZONE DETECTION INTERVAL: 300 ms
MDC IDC SET ZONE DETECTION INTERVAL: 380 ms
Zone Setting Detection Interval: 240 ms

## 2014-01-18 NOTE — Progress Notes (Signed)
Battery @ [facility] today.  ROV 01/24/14 with Dr. [name] to discuss change out.

## 2014-01-24 ENCOUNTER — Encounter: Payer: Self-pay | Admitting: Internal Medicine

## 2014-01-24 ENCOUNTER — Ambulatory Visit (INDEPENDENT_AMBULATORY_CARE_PROVIDER_SITE_OTHER): Payer: 59 | Admitting: Internal Medicine

## 2014-01-24 ENCOUNTER — Encounter: Payer: Self-pay | Admitting: *Deleted

## 2014-01-24 VITALS — BP 90/60 | HR 61 | Ht 71.0 in | Wt 231.2 lb

## 2014-01-24 DIAGNOSIS — I5023 Acute on chronic systolic (congestive) heart failure: Secondary | ICD-10-CM

## 2014-01-24 DIAGNOSIS — I472 Ventricular tachycardia: Secondary | ICD-10-CM

## 2014-01-24 DIAGNOSIS — I4729 Other ventricular tachycardia: Secondary | ICD-10-CM

## 2014-01-24 DIAGNOSIS — I255 Ischemic cardiomyopathy: Secondary | ICD-10-CM

## 2014-01-24 NOTE — Assessment & Plan Note (Signed)
He has had no change in anginal symptoms. No medical changes. He is encouraged to continue his physical activity.

## 2014-01-24 NOTE — Assessment & Plan Note (Signed)
His chronic systolic heart failure has worsened, and he was recently in the hospital. He has left bundle branch block with a QRS duration of approximately 150 ms. He has reached elective replacement on his ICD. I have recommended upgrade from his single-chamber ICD to a biventricular ICD. The risk, goals, benefits, and expectations of the procedure have been discussed with the patient. He wishes to proceed. Of note he is not on Losartan or coreg as previously because of hypotension.

## 2014-01-24 NOTE — Assessment & Plan Note (Signed)
He has had no recurrent ventricular arrhythmias. He will continue sotalol.

## 2014-01-24 NOTE — Patient Instructions (Signed)
Your physician has recommended that you have an upgrade on your device to a Bi-Ventricular (Bi-V/ CRT) defibrillator .  This is done in the hospital and usually requires an overnight stay. Please see the instruction sheet given to you today for more information.  Your physician recommends that you continue on your current medications as directed. Please refer to the Current Medication list given to you today.

## 2014-01-24 NOTE — Addendum Note (Signed)
Addended by: Alvis Lemmings C on: 01/24/2014 10:31 AM   Modules accepted: Orders

## 2014-01-24 NOTE — Progress Notes (Signed)
HPI [name] [name] returns today for followup. He is a pleasant 63 yo man with an ICM, chronic class 3 CHF, and VT. He has no ICD shocks in over a year while on sotalol. He has done well and denies chest pain. No syncope. No ICD shock. He was hospitalized several weeks ago with worsening heart failure symptoms. Allergies  Allergen Reactions  . Penicillins Other (See Comments)    Unknown reaction     Current Outpatient Prescriptions  Medication Sig Dispense Refill  . aspirin EC 81 MG EC tablet Take 1 tablet (81 mg total) by mouth daily.    Marland Kitchen atorvastatin (LIPITOR) 80 MG tablet Take 1 tablet (80 mg total) by mouth daily. 90 tablet 3  . furosemide (LASIX) 80 MG tablet Take 1 tablet (80 mg total) by mouth daily. 90 tablet 3  . insulin NPH-insulin regular (NOVOLIN 70/30 RELION) (70-30) 100 UNIT/ML injection Inject 15 Units into the skin 2 (two) times daily with a meal. 10 mL 12  . magnesium oxide (MAG-OX) 400 MG tablet Take 1 tablet (400 mg total) by mouth daily. 90 tablet 3  . metolazone (ZAROXOLYN) 2.5 MG tablet Take 1 tablet 30 mins before taking furosemide 30 tablet 0  . Multiple Vitamins-Minerals (MULTIVITAMIN PO) Take 1 capsule by mouth daily.     . sotalol (BETAPACE) 120 MG tablet Take 120 mg by mouth 2 (two) times daily.     No current facility-administered medications for this visit.     Past Medical History  Diagnosis Date  . DM type 2 (diabetes mellitus, type 2)   . Ischemic cardiomyopathy March 2015    20-25% 2D   . HTN (hypertension)   . Hypercholesteremia     ablation  . Ventricular tachycardia   . CAD (coronary artery disease) 1950,9326 X 2     RCA-T, 70% PL (off CFX), 99% Prox LAD/90% Dist LAD, S/P TAXUS stent x 2  . ICD (implantable cardiac defibrillator) in place   . CKD (chronic kidney disease)   . Nephrolithiasis   . Atrial flutter 04/2012    s/p TEE-EPS+RFCA 04/2012  . Chronic anticoagulation   . Chronic systolic heart failure     ROS:   All systems  reviewed and negative except as noted in the HPI.   Past Surgical History  Procedure Laterality Date  . Percutaneous coronary stent intervention (pci-s)  January 2002    PTCA/Stent Distal RCA  . Cardiac defibrillator placement  2007     Medtronic Maximo VR, serial number [id number]  . Percutaneous coronary stent intervention (pci-s)  June 2002    PTCA/Stent x 3 RCA, thrombolysis - failed  . Percutaneous coronary stent intervention (pci-s)  July 2006    TAXUS stents to prox and distal LAD     History reviewed. No pertinent family history.   History   Social History  . Marital Status: Married    Spouse Name: N/A    Number of Children: 3  . 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 Smoking status: Former Smoker    Quit date: 03/02/2000  . Smokeless tobacco: Not on file  . Alcohol Use: No  . Drug Use: No  . Sexual Activity: Not on file   Other Topics Concern  . Not on file   Social History Narrative   Pt lives with wife. He hunts regularly and is active, walking >  1 mile at times without difficulty.     BP 90/60 mmHg  Pulse 61  Ht 5\' 11"  (1.803 m)  Wt 231 lb 3.2 oz (104.872 kg)  BMI 32.26 kg/m2  Physical Exam:  Well appearing 63 yo man, NAD HEENT: Unremarkable Neck:  No JVD, no thyromegally Back:  No CVA tenderness Lungs:  Clear with no wheezes HEART:  Regular rate rhythm, no murmurs, no rubs, no clicks Abd:  soft, positive bowel sounds, no organomegally, no rebound, no guarding Ext:  2 plus pulses, no edema, no cyanosis, no clubbing Skin:  No rashes no nodules Neuro:  CN II through XII intact, motor grossly intact   DEVICE  Normal device function.  See PaceArt for details. He has reached ERI.  Assess/Plan:

## 2014-01-29 ENCOUNTER — Other Ambulatory Visit: Payer: 59

## 2014-01-29 ENCOUNTER — Other Ambulatory Visit: Payer: Self-pay | Admitting: *Deleted

## 2014-01-29 DIAGNOSIS — I255 Ischemic cardiomyopathy: Secondary | ICD-10-CM

## 2014-01-29 DIAGNOSIS — I472 Ventricular tachycardia, unspecified: Secondary | ICD-10-CM

## 2014-01-30 ENCOUNTER — Other Ambulatory Visit (INDEPENDENT_AMBULATORY_CARE_PROVIDER_SITE_OTHER): Payer: 59 | Admitting: *Deleted

## 2014-01-30 DIAGNOSIS — I4729 Other ventricular tachycardia: Secondary | ICD-10-CM

## 2014-01-30 DIAGNOSIS — I255 Ischemic cardiomyopathy: Secondary | ICD-10-CM

## 2014-01-30 DIAGNOSIS — I472 Ventricular tachycardia: Secondary | ICD-10-CM

## 2014-01-30 DIAGNOSIS — I5023 Acute on chronic systolic (congestive) heart failure: Secondary | ICD-10-CM

## 2014-01-30 LAB — PROTIME-INR
INR: 1.3 ratio — ABNORMAL HIGH (ref 0.8–1.0)
PROTHROMBIN TIME: 14.1 s — AB (ref 9.6–13.1)

## 2014-01-30 LAB — CBC WITH DIFFERENTIAL/PLATELET
Basophils Absolute: 0 10*3/uL (ref 0.0–0.1)
Basophils Relative: 0.5 % (ref 0.0–3.0)
Eosinophils Absolute: 0.2 10*3/uL (ref 0.0–0.7)
Eosinophils Relative: 2.9 % (ref 0.0–5.0)
HEMATOCRIT: 38.4 % — AB (ref 39.0–52.0)
HEMOGLOBIN: 12.2 g/dL — AB (ref 13.0–17.0)
LYMPHS ABS: 1.7 10*3/uL (ref 0.7–4.0)
Lymphocytes Relative: 28 % (ref 12.0–46.0)
MCHC: 31.8 g/dL (ref 30.0–36.0)
MCV: 84.1 fl (ref 78.0–100.0)
MONOS PCT: 11.4 % (ref 3.0–12.0)
Monocytes Absolute: 0.7 10*3/uL (ref 0.1–1.0)
NEUTROS ABS: 3.5 10*3/uL (ref 1.4–7.7)
Neutrophils Relative %: 57.2 % (ref 43.0–77.0)
Platelets: 168 10*3/uL (ref 150.0–400.0)
RBC: 4.56 Mil/uL (ref 4.22–5.81)
RDW: 15.7 % — AB (ref 11.5–15.5)
WBC: 6.1 10*3/uL (ref 4.0–10.5)

## 2014-01-31 LAB — BASIC METABOLIC PANEL
BUN: 25 mg/dL — ABNORMAL HIGH (ref 6–23)
CHLORIDE: 99 meq/L (ref 96–112)
CO2: 26 meq/L (ref 19–32)
CREATININE: 1.1 mg/dL (ref 0.4–1.5)
Calcium: 9 mg/dL (ref 8.4–10.5)
GFR: 85.8 mL/min (ref >60.00)
GLUCOSE: 318 mg/dL — AB (ref 70–99)
Potassium: 4.4 mEq/L (ref 3.5–5.1)
Sodium: 135 mEq/L (ref 135–145)

## 2014-02-01 MED ORDER — MUPIROCIN 2 % EX OINT
TOPICAL_OINTMENT | Freq: Once | CUTANEOUS | Status: DC
Start: 2014-02-01 — End: 2014-02-05

## 2014-02-04 DIAGNOSIS — I5023 Acute on chronic systolic (congestive) heart failure: Secondary | ICD-10-CM | POA: Diagnosis not present

## 2014-02-04 DIAGNOSIS — E1121 Type 2 diabetes mellitus with diabetic nephropathy: Secondary | ICD-10-CM | POA: Diagnosis not present

## 2014-02-04 DIAGNOSIS — Z794 Long term (current) use of insulin: Secondary | ICD-10-CM | POA: Diagnosis not present

## 2014-02-04 DIAGNOSIS — Z955 Presence of coronary angioplasty implant and graft: Secondary | ICD-10-CM | POA: Diagnosis not present

## 2014-02-04 DIAGNOSIS — R9431 Abnormal electrocardiogram [ECG] [EKG]: Secondary | ICD-10-CM | POA: Diagnosis not present

## 2014-02-04 DIAGNOSIS — I2510 Atherosclerotic heart disease of native coronary artery without angina pectoris: Secondary | ICD-10-CM | POA: Diagnosis not present

## 2014-02-04 DIAGNOSIS — Z7901 Long term (current) use of anticoagulants: Secondary | ICD-10-CM | POA: Diagnosis not present

## 2014-02-04 DIAGNOSIS — I472 Ventricular tachycardia: Secondary | ICD-10-CM | POA: Diagnosis not present

## 2014-02-04 DIAGNOSIS — I4892 Unspecified atrial flutter: Secondary | ICD-10-CM | POA: Diagnosis not present

## 2014-02-04 DIAGNOSIS — Z4502 Encounter for adjustment and management of automatic implantable cardiac defibrillator: Secondary | ICD-10-CM | POA: Diagnosis not present

## 2014-02-04 DIAGNOSIS — Z87891 Personal history of nicotine dependence: Secondary | ICD-10-CM | POA: Diagnosis not present

## 2014-02-04 DIAGNOSIS — Z7982 Long term (current) use of aspirin: Secondary | ICD-10-CM | POA: Diagnosis not present

## 2014-02-04 DIAGNOSIS — I440 Atrioventricular block, first degree: Secondary | ICD-10-CM | POA: Diagnosis not present

## 2014-02-04 DIAGNOSIS — Z95810 Presence of automatic (implantable) cardiac defibrillator: Secondary | ICD-10-CM | POA: Diagnosis not present

## 2014-02-04 DIAGNOSIS — Z87442 Personal history of urinary calculi: Secondary | ICD-10-CM | POA: Diagnosis not present

## 2014-02-04 DIAGNOSIS — I255 Ischemic cardiomyopathy: Secondary | ICD-10-CM | POA: Diagnosis not present

## 2014-02-04 DIAGNOSIS — Z880 Allergy status to penicillin: Secondary | ICD-10-CM | POA: Diagnosis not present

## 2014-02-04 DIAGNOSIS — I447 Left bundle-branch block, unspecified: Secondary | ICD-10-CM | POA: Diagnosis not present

## 2014-02-04 DIAGNOSIS — I129 Hypertensive chronic kidney disease with stage 1 through stage 4 chronic kidney disease, or unspecified chronic kidney disease: Secondary | ICD-10-CM | POA: Diagnosis not present

## 2014-02-04 DIAGNOSIS — I5022 Chronic systolic (congestive) heart failure: Secondary | ICD-10-CM | POA: Diagnosis present

## 2014-02-04 DIAGNOSIS — N189 Chronic kidney disease, unspecified: Secondary | ICD-10-CM | POA: Diagnosis not present

## 2014-02-04 MED ORDER — VANCOMYCIN HCL 10 G IV SOLR
1500.0000 mg | INTRAVENOUS | Status: DC
  Filled 2014-02-04: qty 1500

## 2014-02-04 MED ORDER — SODIUM CHLORIDE 0.9 % IR SOLN
80.0000 mg | Status: DC
  Filled 2014-02-04: qty 2

## 2014-02-05 ENCOUNTER — Encounter (HOSPITAL_COMMUNITY)
Admission: RE | Disposition: A | Discharge status: Home / Self Care | Payer: Self-pay | Source: Ambulatory Visit | Attending: Internal Medicine

## 2014-02-05 ENCOUNTER — Ambulatory Visit (HOSPITAL_COMMUNITY)
Admission: RE | Admit: 2014-02-05 | Discharge: 2014-02-06 | Disposition: A | Discharge status: Home / Self Care | Payer: 59 | Source: Ambulatory Visit | Attending: Internal Medicine | Admitting: Internal Medicine

## 2014-02-05 DIAGNOSIS — Z4502 Encounter for adjustment and management of automatic implantable cardiac defibrillator: Secondary | ICD-10-CM | POA: Insufficient documentation

## 2014-02-05 DIAGNOSIS — I472 Ventricular tachycardia, unspecified: Secondary | ICD-10-CM

## 2014-02-05 DIAGNOSIS — I255 Ischemic cardiomyopathy: Secondary | ICD-10-CM

## 2014-02-05 DIAGNOSIS — Z87891 Personal history of nicotine dependence: Secondary | ICD-10-CM | POA: Insufficient documentation

## 2014-02-05 DIAGNOSIS — I4892 Unspecified atrial flutter: Secondary | ICD-10-CM | POA: Insufficient documentation

## 2014-02-05 DIAGNOSIS — Z955 Presence of coronary angioplasty implant and graft: Secondary | ICD-10-CM | POA: Insufficient documentation

## 2014-02-05 DIAGNOSIS — I5022 Chronic systolic (congestive) heart failure: Secondary | ICD-10-CM | POA: Diagnosis present

## 2014-02-05 DIAGNOSIS — E1121 Type 2 diabetes mellitus with diabetic nephropathy: Secondary | ICD-10-CM | POA: Diagnosis not present

## 2014-02-05 DIAGNOSIS — I447 Left bundle-branch block, unspecified: Secondary | ICD-10-CM | POA: Insufficient documentation

## 2014-02-05 DIAGNOSIS — N189 Chronic kidney disease, unspecified: Secondary | ICD-10-CM | POA: Insufficient documentation

## 2014-02-05 DIAGNOSIS — Z7982 Long term (current) use of aspirin: Secondary | ICD-10-CM | POA: Insufficient documentation

## 2014-02-05 DIAGNOSIS — Z880 Allergy status to penicillin: Secondary | ICD-10-CM | POA: Insufficient documentation

## 2014-02-05 DIAGNOSIS — Z87442 Personal history of urinary calculi: Secondary | ICD-10-CM | POA: Insufficient documentation

## 2014-02-05 DIAGNOSIS — I440 Atrioventricular block, first degree: Secondary | ICD-10-CM | POA: Insufficient documentation

## 2014-02-05 DIAGNOSIS — Z95810 Presence of automatic (implantable) cardiac defibrillator: Secondary | ICD-10-CM

## 2014-02-05 DIAGNOSIS — I2510 Atherosclerotic heart disease of native coronary artery without angina pectoris: Secondary | ICD-10-CM | POA: Insufficient documentation

## 2014-02-05 DIAGNOSIS — Z7901 Long term (current) use of anticoagulants: Secondary | ICD-10-CM | POA: Insufficient documentation

## 2014-02-05 DIAGNOSIS — I5023 Acute on chronic systolic (congestive) heart failure: Secondary | ICD-10-CM | POA: Diagnosis not present

## 2014-02-05 DIAGNOSIS — I129 Hypertensive chronic kidney disease with stage 1 through stage 4 chronic kidney disease, or unspecified chronic kidney disease: Secondary | ICD-10-CM | POA: Insufficient documentation

## 2014-02-05 DIAGNOSIS — Z794 Long term (current) use of insulin: Secondary | ICD-10-CM | POA: Insufficient documentation

## 2014-02-05 DIAGNOSIS — R9431 Abnormal electrocardiogram [ECG] [EKG]: Secondary | ICD-10-CM | POA: Insufficient documentation

## 2014-02-05 HISTORY — PX: BIV ICD GENERTAOR CHANGE OUT: SHX5745

## 2014-02-05 HISTORY — PX: BI-VENTRICULAR IMPLANTABLE CARDIOVERTER DEFIBRILLATOR UPGRADE: SHX5461

## 2014-02-05 HISTORY — DX: Presence of automatic (implantable) cardiac defibrillator: Z95.810

## 2014-02-05 HISTORY — DX: Heart failure, unspecified: I50.9

## 2014-02-05 LAB — GLUCOSE, CAPILLARY
GLUCOSE-CAPILLARY: 252 mg/dL — AB (ref 70–99)
Glucose-Capillary: 268 mg/dL — ABNORMAL HIGH (ref 70–99)
Glucose-Capillary: 289 mg/dL — ABNORMAL HIGH (ref 70–99)
Glucose-Capillary: 317 mg/dL — ABNORMAL HIGH (ref 70–99)
Glucose-Capillary: 350 mg/dL — ABNORMAL HIGH (ref 70–99)

## 2014-02-05 LAB — SURGICAL PCR SCREEN
MRSA, PCR: NEGATIVE
STAPHYLOCOCCUS AUREUS: NEGATIVE

## 2014-02-05 SURGERY — BI-VENTRICULAR IMPLANTABLE CARDIOVERTER DEFIBRILLATOR UPGRADE
Anesthesia: LOCAL

## 2014-02-05 MED ORDER — METOLAZONE 2.5 MG PO TABS
2.5000 mg | ORAL_TABLET | ORAL | Status: DC
  Administered 2014-02-05: 2.5 mg via ORAL
  Filled 2014-02-05 (×2): qty 1

## 2014-02-05 MED ORDER — SODIUM CHLORIDE 0.9 % IV SOLN
INTRAVENOUS | Status: DC
  Administered 2014-02-05: 10:00:00 via INTRAVENOUS

## 2014-02-05 MED ORDER — FUROSEMIDE 80 MG PO TABS
80.0000 mg | ORAL_TABLET | Freq: Every day | ORAL | Status: DC
  Administered 2014-02-05 – 2014-02-06 (×2): 80 mg via ORAL
  Filled 2014-02-05 (×2): qty 1

## 2014-02-05 MED ORDER — MAGNESIUM OXIDE 400 MG PO TABS
400.0000 mg | ORAL_TABLET | Freq: Every day | ORAL | Status: DC
  Administered 2014-02-05 – 2014-02-06 (×2): 400 mg via ORAL
  Filled 2014-02-05 (×2): qty 1

## 2014-02-05 MED ORDER — CHLORHEXIDINE GLUCONATE 4 % EX LIQD
60.0000 mL | Freq: Once | CUTANEOUS | Status: DC
  Filled 2014-02-05: qty 60

## 2014-02-05 MED ORDER — ASPIRIN EC 81 MG PO TBEC
81.0000 mg | DELAYED_RELEASE_TABLET | Freq: Every day | ORAL | Status: DC
  Administered 2014-02-05 – 2014-02-06 (×2): 81 mg via ORAL
  Filled 2014-02-05 (×2): qty 1

## 2014-02-05 MED ORDER — MUPIROCIN 2 % EX OINT
TOPICAL_OINTMENT | CUTANEOUS | Status: AC
  Administered 2014-02-05: 1
  Filled 2014-02-05: qty 22

## 2014-02-05 MED ORDER — FENTANYL CITRATE 0.05 MG/ML IJ SOLN
INTRAMUSCULAR | Status: AC
  Filled 2014-02-05: qty 2

## 2014-02-05 MED ORDER — MIDAZOLAM HCL 5 MG/5ML IJ SOLN
INTRAMUSCULAR | Status: AC
  Filled 2014-02-05: qty 5

## 2014-02-05 MED ORDER — SOTALOL HCL 120 MG PO TABS
120.0000 mg | ORAL_TABLET | Freq: Two times a day (BID) | ORAL | Status: DC
  Administered 2014-02-05 – 2014-02-06 (×2): 120 mg via ORAL
  Filled 2014-02-05 (×3): qty 1

## 2014-02-05 MED ORDER — ONDANSETRON HCL 4 MG/2ML IJ SOLN: 4.0000 mg | Freq: Four times a day (QID) | INTRAMUSCULAR | Status: DC | PRN

## 2014-02-05 MED ORDER — LOSARTAN POTASSIUM 50 MG PO TABS
50.0000 mg | ORAL_TABLET | Freq: Every day | ORAL | Status: DC
  Administered 2014-02-06: 50 mg via ORAL
  Filled 2014-02-05: qty 1

## 2014-02-05 MED ORDER — ACETAMINOPHEN 325 MG PO TABS: 325.0000 mg | ORAL_TABLET | ORAL | Status: DC | PRN

## 2014-02-05 MED ORDER — HEPARIN (PORCINE) IN NACL 2-0.9 UNIT/ML-% IJ SOLN
INTRAMUSCULAR | Status: AC
  Filled 2014-02-05: qty 500

## 2014-02-05 MED ORDER — ATORVASTATIN CALCIUM 80 MG PO TABS
80.0000 mg | ORAL_TABLET | Freq: Every day | ORAL | Status: DC
  Filled 2014-02-05: qty 1

## 2014-02-05 MED ORDER — VANCOMYCIN HCL IN DEXTROSE 1-5 GM/200ML-% IV SOLN
1000.0000 mg | Freq: Two times a day (BID) | INTRAVENOUS | Status: AC
  Administered 2014-02-05: 1000 mg via INTRAVENOUS
  Filled 2014-02-05: qty 200

## 2014-02-05 MED ORDER — INSULIN ASPART 100 UNIT/ML ~~LOC~~ SOLN
0.0000 [IU] | Freq: Every day | SUBCUTANEOUS | Status: DC
  Administered 2014-02-05: 4 [IU] via SUBCUTANEOUS

## 2014-02-05 MED ORDER — MUPIROCIN 2 % EX OINT
1.0000 "application " | TOPICAL_OINTMENT | Freq: Once | CUTANEOUS | Status: DC
  Filled 2014-02-05: qty 22

## 2014-02-05 MED ORDER — INSULIN ASPART 100 UNIT/ML ~~LOC~~ SOLN
3.0000 [IU] | Freq: Three times a day (TID) | SUBCUTANEOUS | Status: DC
  Administered 2014-02-06 (×2): 3 [IU] via SUBCUTANEOUS

## 2014-02-05 MED ORDER — LIDOCAINE HCL (PF) 1 % IJ SOLN
INTRAMUSCULAR | Status: AC
  Filled 2014-02-05: qty 60

## 2014-02-05 NOTE — H&P (View-Only) (Signed)
HPI [name] returns today for followup. He is a pleasant 63 yo man with an ICM, chronic class 3 CHF, and VT. He has no ICD shocks in over a year while on sotalol. He has done well and denies chest pain. No syncope. No ICD shock. He was hospitalized several weeks ago with worsening heart failure symptoms. Allergies  Allergen Reactions  . Penicillins Other (See Comments)    Unknown reaction     Current Outpatient Prescriptions  Medication Sig Dispense Refill  . aspirin EC 81 MG EC tablet Take 1 tablet (81 mg total) by mouth daily.    Marland Kitchen atorvastatin (LIPITOR) 80 MG tablet Take 1 tablet (80 mg total) by mouth daily. 90 tablet 3  . furosemide (LASIX) 80 MG tablet Take 1 tablet (80 mg total) by mouth daily. 90 tablet 3  . insulin NPH-insulin regular (NOVOLIN 70/30 RELION) (70-30) 100 UNIT/ML injection Inject 15 Units into the skin 2 (two) times daily with a meal. 10 mL 12  . magnesium oxide (MAG-OX) 400 MG tablet Take 1 tablet (400 mg total) by mouth daily. 90 tablet 3  . metolazone (ZAROXOLYN) 2.5 MG tablet Take 1 tablet 30 mins before taking furosemide 30 tablet 0  . Multiple Vitamins-Minerals (MULTIVITAMIN PO) Take 1 capsule by mouth daily.     . sotalol (BETAPACE) 120 MG tablet Take 120 mg by mouth 2 (two) times daily.     No current facility-administered medications for this visit.     Past Medical History  Diagnosis Date  . DM type 2 (diabetes mellitus, type 2)   . Ischemic cardiomyopathy March 2015    20-25% 2D   . HTN (hypertension)   . Hypercholesteremia     ablation  . Ventricular tachycardia   . CAD (coronary artery disease) 7262,0355 X 2     RCA-T, 70% PL (off CFX), 99% Prox LAD/90% Dist LAD, S/P TAXUS stent x 2  . ICD (implantable cardiac defibrillator) in place   . CKD (chronic kidney disease)   . Nephrolithiasis   . Atrial flutter 04/2012    s/p TEE-EPS+RFCA 04/2012  . Chronic anticoagulation   . Chronic systolic heart failure     ROS:   All systems  reviewed and negative except as noted in the HPI.   Past Surgical History  Procedure Laterality Date  . Percutaneous coronary stent intervention (pci-s)  January 2002    PTCA/Stent Distal RCA  . Cardiac defibrillator placement  2007     Medtronic Maximo VR, serial number [id number]  . Percutaneous coronary stent intervention (pci-s)  June 2002    PTCA/Stent x 3 RCA, thrombolysis - failed  . Percutaneous coronary stent intervention (pci-s)  July 2006    TAXUS stents to prox and distal LAD     History reviewed. No pertinent family history.   History   Social History  . Marital Status: Married    Spouse Name: N/A    Number of Children: 3  . 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 Smoking status: Former Smoker    Quit date: 03/02/2000  . Smokeless tobacco: Not on file  . Alcohol Use: No  . Drug Use: No  . Sexual Activity: Not on file   Other Topics Concern  . Not on file   Social History Narrative   Pt lives with wife. He hunts regularly and is active, walking >  1 mile at times without difficulty.     BP 90/60 mmHg  Pulse 61  Ht 5\' 11"  (1.803 m)  Wt 231 lb 3.2 oz (104.872 kg)  BMI 32.26 kg/m2  Physical Exam:  Well appearing 63 yo man, NAD HEENT: Unremarkable Neck:  No JVD, no thyromegally Back:  No CVA tenderness Lungs:  Clear with no wheezes HEART:  Regular rate rhythm, no murmurs, no rubs, no clicks Abd:  soft, positive bowel sounds, no organomegally, no rebound, no guarding Ext:  2 plus pulses, no edema, no cyanosis, no clubbing Skin:  No rashes no nodules Neuro:  CN II through XII intact, motor grossly intact   DEVICE  Normal device function.  See PaceArt for details. He has reached ERI.  Assess/Plan:

## 2014-02-05 NOTE — CV Procedure (Signed)
Electrophysiology procedure note  Procedure: Insertion of a new right atrial lead, a new left ventricular lead, removal of previously implanted ICD which had reached elective replacement, and insertion of a new biventricular ICD with ICD defibrillation threshold testing, and ICD pocket revision.  Preoperative diagnosis: Chronic systolic heart failure, class III, left bundle branch block, QRS duration 145 ms, prior ventricular tachycardia on sotalol, with recurrent ICD at elective replacement.  Postoperative diagnosis: Same as preoperative diagnosis.  Description of the procedure: After informed consent was obtained, the patient was taken to the diagnostic electrophysiology laboratory in the fasting state. After the usual preparation draping, intravenous Versed and fentanyl were used for sedation. 30 cc of lidocaine was infiltrated into the left infraclavicular region. A 5 cm incision was carried out over this region, and electrocautery was utilized to dissect down to the fascial plane. Care was taken not to enter the ICD pocket. The left subclavian vein was punctured twice. The Medtronic model 5076 active fixation pacing lead, serial number [id number] was advanced into the right atrium and mapping carried out. The lead was actively fixed. The P wave measured 3 mV. The pacing impedance was 456 ohms. The pacing threshold was 0.5 volts at 0.4 ms. 10 V pacing did not stimulate the diaphragm. Next, attention was turned to the placement of the left ventricular lead. A Medtronic guiding catheter along with a 6 French hexapolar EP catheter was inserted by way of the left subclavian vein into the right atrium. The coronary sinus was cannulated. A guiding catheter was advanced into the coronary sinus. Venography of the coronary sinus was carried out. This demonstrated a modest posterolateral vein. An angioplasty 0.014 guiding wire was advanced into the posterolateral vein. The Medtronic model 4598, 88 cm quadripolar  pacing lead, serial [id number] was advanced over the guidewire and into the vein. Appropriate capture was demonstrated. The guiding catheter was removed in the usual manner. The leads were secured to the fascial plane with silk suture.  At this point electrocautery was utilized to enter the ICD pocket in the ICD generator was removed. Electrocautery was then used to revise the pocket to accommodate the larger device and additional leads. Electrocautery was utilized to assure hemostasis. The pocket was irrigated with antibiotic irrigation. The Medtronic biventricular ICD, serial number  [id number] was connected to the new left ventricular and right atrial leads, and the old ICD lead, and placed back in the subcutaneous pocket. The pocket was irrigated with additional antibiotic irrigation. The incision was closed with 2 layers of Vicryl suture.  At this point I scrubbed out of the case to supervise defibrillation threshold testing. The patient was more deep was sedated with both Versed and fentanyl under my direct supervision. Ventricular fibrillation was induced with 50 Hz burst pacing. A 20 J shock was delivered, through the device, restoring sinus rhythm. The shocking impedance was 42 ohms. The duration was 7 seconds. At this point benzoin and Steri-Strips her pain on the skin dressing was placed and the patient was returned to his room in satisfactory condition.  Complications: There were no immediate procedure complications  Conclusion: Successful upgrade of a single chamber ICD which had reached elective replacement to a new biventricular ICD in a patient with class III heart failure, left bundle branch block, a non-ischemic cardiomyopathy and prior ventricular tachycardia.  [name], M.D.

## 2014-02-05 NOTE — Interval H&P Note (Signed)
History and Physical Interval Note:  02/05/2014 11:16 AM  [name] [name]  has presented today for surgery, with the diagnosis of cm/hf  The various methods of treatment have been discussed with the patient and family. After consideration of risks, benefits and other options for treatment, the patient has consented to  Procedure(s): BI-VENTRICULAR IMPLANTABLE CARDIOVERTER DEFIBRILLATOR UPGRADE (N/A) as a surgical intervention .  The patient's history has been reviewed, patient examined, no change in status, stable for surgery.  I have reviewed the patient's chart and labs.  Questions were answered to the patient's satisfaction.     [name] [name]

## 2014-02-05 NOTE — Progress Notes (Signed)
Orthopedic Tech Progress Note Patient Details:  [name] [name] Nov 10, 1950 [id number] Patient has arm sling already. Patient ID: [name] [name], male   DOB: [birth year], 63 y.o.   MRN: [id number]   [name] 02/05/2014, 4:32 PM

## 2014-02-05 NOTE — Care Management Note (Signed)
    Page 1 of 1   02/05/2014     5:12:30 PM CARE MANAGEMENT NOTE 02/05/2014  Patient:  Rylee,Klein L   Account Number:  [id number]  Date Initiated:  02/05/2014  Documentation initiated by:  Derenda Giddings  Subjective/Objective Assessment:   Procedure(s):  BI-VENTRICULAR IMPLANTABLE CARDIOVERTER DEFIBRILLATOR UPGRADE (N/A) as a surgical intervention     Action/Plan:   CM to follow for dispo needs   Anticipated DC Date:  02/06/2014   Anticipated DC Plan:  HOME/SELF CARE         Choice offered to / List presented to:             Status of service:  Completed, signed off Medicare Important Message given?   (If response is "NO", the following Medicare IM given date fields will be blank) Date Medicare IM given:   Medicare IM given by:   Date Additional Medicare IM given:   Additional Medicare IM given by:    Discharge Disposition:  HOME/SELF CARE  Per UR Regulation:    If discussed at Long Length of Stay Meetings, dates discussed:    Comments:  Shakisha Abend RN, BSN, MSHL, CCM  Nurse - Case Manager,  ([facility])  [phone]  02/05/2014

## 2014-02-05 NOTE — Progress Notes (Signed)
Arm sling applied to left arm. Waiting on tele bed assignment.

## 2014-02-05 NOTE — H&P (Signed)
  ICD Criteria  Current LVEF:20% ;Obtained > or = 1 month ago and < or = 3 months ago.  NYHA Functional Classification: Class III  Heart Failure History:  Yes, Duration of heart failure since onset is > 9 months  Non-Ischemic Dilated Cardiomyopathy History:  Yes, timeframe is > 9 months  Atrial Fibrillation/Atrial Flutter:  No.  Ventricular Tachycardia History:  Yes, Hemodynamic instability present, VT Type:  SVT - Monomorphic.  Cardiac Arrest History:  No  History of Syndromes with Risk of Sudden Death:  No.  Previous ICD:  Yes, ICD Type:  Single, Reason for ICD:  Primary prevention.  30%  Electrophysiology Study: No.  Prior MI: No.  PPM: No.  OSA:  No  Patient Life Expectancy of >=1 year: Yes.  Anticoagulation Therapy:  Patient is NOT on anticoagulation therapy.   Beta Blocker Therapy:  Yes.   Ace Inhibitor/ARB Therapy:  Yes.

## 2014-02-06 ENCOUNTER — Encounter (HOSPITAL_COMMUNITY): Payer: Self-pay | Admitting: General Practice

## 2014-02-06 ENCOUNTER — Observation Stay (HOSPITAL_COMMUNITY): Payer: 59

## 2014-02-06 DIAGNOSIS — Z4502 Encounter for adjustment and management of automatic implantable cardiac defibrillator: Secondary | ICD-10-CM | POA: Diagnosis not present

## 2014-02-06 DIAGNOSIS — Z95810 Presence of automatic (implantable) cardiac defibrillator: Secondary | ICD-10-CM

## 2014-02-06 DIAGNOSIS — E1121 Type 2 diabetes mellitus with diabetic nephropathy: Secondary | ICD-10-CM | POA: Diagnosis not present

## 2014-02-06 DIAGNOSIS — I5023 Acute on chronic systolic (congestive) heart failure: Secondary | ICD-10-CM | POA: Diagnosis not present

## 2014-02-06 DIAGNOSIS — I255 Ischemic cardiomyopathy: Secondary | ICD-10-CM | POA: Diagnosis not present

## 2014-02-06 LAB — GLUCOSE, CAPILLARY
GLUCOSE-CAPILLARY: 220 mg/dL — AB (ref 70–99)
Glucose-Capillary: 226 mg/dL — ABNORMAL HIGH (ref 70–99)

## 2014-02-06 MED ORDER — METOLAZONE 2.5 MG PO TABS: 2.5000 mg | ORAL_TABLET | ORAL | Status: DC

## 2014-02-06 NOTE — Discharge Instructions (Signed)
° °  Supplemental Discharge Instructions for  Pacemaker/Defibrillator Patients  Activity No heavy lifting or vigorous activity with your left/right arm for 6 to 8 weeks.  Do not raise your left/right arm above your head for one week.  Gradually raise your affected arm as drawn below.                       12/11                    12/12                    12/13                    12/14            NO DRIVING for  one week    ; you may begin driving on     94/07     . WOUND CARE - Keep the wound area clean and dry.  Do not get this area wet for one week. No showers for one week; you may shower on      12/15        . - The tape/steri-strips on your wound will fall off; do not pull them off.  No bandage is needed on the site.  DO  NOT apply any creams, oils, or ointments to the wound area. - If you notice any drainage or discharge from the wound, any swelling or bruising at the site, or you develop a fever > 101? F after you are discharged home, call the office at once.  Special Instructions - You are still able to use cellular telephones; use the ear opposite the side where you have your pacemaker/defibrillator.  Avoid carrying your cellular phone near your device. - When traveling through airports, show security personnel your identification card to avoid being screened in the metal detectors.  Ask the security personnel to use the hand wand. - Avoid arc welding equipment, MRI testing (magnetic resonance imaging), TENS units (transcutaneous nerve stimulators).  Call the office for questions about other devices. - Avoid electrical appliances that are in poor condition or are not properly grounded. - Microwave ovens are safe to be near or to operate.  Additional information for defibrillator patients should your device go off: - If your device goes off ONCE and you feel fine afterward, notify the device clinic nurses. - If your device goes off ONCE and you do not feel well afterward, call  911. - If your device goes off TWICE, call 911. - If your device goes off THREE times in one day, call 911.  DO NOT DRIVE YOURSELF OR A FAMILY MEMBER WITH A DEFIBRILLATOR TO THE HOSPITAL--CALL 911.

## 2014-02-06 NOTE — Progress Notes (Signed)
Pt is being discharged home. Pt has been provided with discharge instructions. RN went over the instructions and answered all questions the patient had. He is being transported home by his daughter who is at the bedside.

## 2014-02-06 NOTE — Discharge Summary (Addendum)
CARDIOLOGY DISCHARGE SUMMARY   Patient ID: [name] [name] MRN: [id number] DOB/AGE: August 06, 1950 63 y.o.  Admit date: 02/05/2014 Discharge date: 02/06/2014  PCP: Leonard Downing, MD Primary Cardiologist: Dr. [name]  Primary Discharge Diagnosis: Chronic systolic heart failure - s/p Medtronic biventricular ICD, serial number [id number] H insertion  Secondary Discharge Diagnosis:    Ischemic cardiomyopathy   Type 2 diabetes with nephropathy   Automatic implantable cardioverter-defibrillator in situ  Procedures: Insertion of a new right atrial lead, a new left ventricular lead, removal of previously implanted ICD which had reached elective replacement, and insertion of a new biventricular ICD with ICD defibrillation threshold testing, and ICD pocket revision.  Hospital Course: [name] [name] is a 63 y.o. male with a history of CAD, ischemic cardiomyopathy and chronic CHF class III. He was hospitalized recently for CHF exacerbation. He saw Dr. [name] in follow-up and his device was interrogated. It was at [facility]. He had a single-chamber ICD. Because of his CHF, and worsening of his chronic heart failure, upgrade to a biventricular ICD was recommended. He agreed to the procedure and came to the hospital for this on 02/05/2014.  A Medtronic biventricular ICD, serial number [id number], was inserted without immediate complication. He tolerated the procedure well. He was held overnight.  On 12/08, he was seen by Dr. [name] and all data were reviewed. His postprocedure chest x-ray was without pneumothorax and leads were in good position, results are below. The device was tested and functioning appropriately. No further inpatient workup was indicated and he is considered stable for discharge, to follow up as an outpatient.    Radiology: Dg Chest 2 View 02/06/2014   CLINICAL DATA:  63 year old diabetic hypertensive male with sore arm post AICD placement yesterday. Initial encounter.  :  CHEST  2 VIEW  COMPARISON:  12/18/2013.  FINDINGS: Interval placement of right atrial and left ventricular lead. Right ventricular AICD lead remains in place. No gross pneumothorax or findings to suggest postoperative hematoma.  Cardiomegaly.  Pulmonary vascular prominence superimposed upon chronic lung changes.  Mild degenerative changes thoracic spine.  IMPRESSION: Interval placement of right atrial and left ventricular lead. Right ventricular AICD lead remains in place. No gross pneumothorax or findings to suggest postoperative hematoma.  Cardiomegaly.  Pulmonary vascular prominence superimposed upon chronic lung changes.   Electronically Signed   By: Chauncey Cruel M.D.   On: 02/06/2014 07:56   EKG: Sinus rhythm, by V pacing  FOLLOW UP PLANS AND APPOINTMENTS Allergies  Allergen Reactions  . Penicillins Other (See Comments)    Unknown reaction     Medication List    TAKE these medications        aspirin 81 MG EC tablet  Take 1 tablet (81 mg total) by mouth daily.     atorvastatin 80 MG tablet  Commonly known as:  LIPITOR  Take 1 tablet (80 mg total) by mouth daily.     furosemide 80 MG tablet  Commonly known as:  LASIX  Take 1 tablet (80 mg total) by mouth daily.     insulin NPH-regular Human (70-30) 100 UNIT/ML injection  Commonly known as:  NOVOLIN 70/30 RELION  Inject 15 Units into the skin 2 (two) times daily with a meal.     losartan 50 MG tablet  Commonly known as:  COZAAR  Take 50 mg by mouth daily.     magnesium oxide 400 MG tablet  Commonly known as:  MAG-OX  Take 1 tablet (400 mg total)  by mouth daily.     metolazone 2.5 MG tablet  Commonly known as:  ZAROXOLYN  Take 1 tablet (2.5 mg total) by mouth 3 (three) times a week. Monday, Wednesday and Friday. Take 1 tablet 30 mins before taking furosemide     MULTIVITAMIN PO  Take 1 capsule by mouth daily.     sotalol 120 MG tablet  Commonly known as:  BETAPACE  Take 120 mg by mouth 2 (two) times daily.         Discharge Instructions    Diet - low sodium heart healthy    Complete by:  As directed      Diet Carb Modified    Complete by:  As directed      Increase activity slowly    Complete by:  As directed           Follow-up Information    Follow up with [facility] On 02/15/2014.   Why:  Wound check and device check at 4:30 pm   Contact information:   [place] [id number]       Follow up with [name], MD In 3 months.   Specialty:  Cardiology   Why:  The office is aware   Contact information:   [address] [address] [phone]       BRING ALL MEDICATIONS WITH YOU TO FOLLOW UP APPOINTMENTS  Time spent with patient to include physician time: 33 min Signed: Rosaria Ferries, PA-C 02/06/2014, 12:32 PM Co-Sign MD  EP attending  Patient seen and examined. Agree with above.  Mikle Bosworth.D.

## 2014-02-06 NOTE — Progress Notes (Signed)
Patient ID: [name] [name], male   DOB: 1950/08/05, 63 y.o.   MRN: [id number]    Patient Name: [name] [name] Date of Encounter: 02/06/2014     Active Problems:   Acute on chronic systolic heart failure   Chronic systolic heart failure    SUBJECTIVE  No chest pain or sob.   CURRENT MEDS . aspirin EC  81 mg Oral Daily  . atorvastatin  80 mg Oral q1800  . furosemide  80 mg Oral Daily  . insulin aspart  0-5 Units Subcutaneous QHS  . insulin aspart  3 Units Subcutaneous TID WC  . losartan  50 mg Oral Daily  . magnesium oxide  400 mg Oral Daily  . metolazone  2.5 mg Oral Once per day on Mon Wed Fri  . sotalol  120 mg Oral BID    OBJECTIVE  Filed Vitals:   02/05/14 1856 02/05/14 2053 02/06/14 0002 02/06/14 0500  BP: 121/81 127/80 117/77 105/76  Pulse: 65 69 66 64  Temp: 97.5 F (36.4 C) 97.8 F (36.6 C) 97.5 F (36.4 C) 97.9 F (36.6 C)  TempSrc: Oral Oral Oral Oral  Resp: 18 18 16 16   Height:      Weight:    226 lb 14.4 oz (102.921 kg)  SpO2: 98% 95% 97% 100%    Intake/Output Summary (Last 24 hours) at 02/06/14 0826 Last data filed at 02/06/14 0606  Gross per 24 hour  Intake   1040 ml  Output   1650 ml  Net   -610 ml   Filed Weights   02/05/14 0955 02/06/14 0500  Weight: 226 lb (102.513 kg) 226 lb 14.4 oz (102.921 kg)    PHYSICAL EXAM  General: Pleasant, NAD. Neuro: Alert and oriented X 3. Moves all extremities spontaneously. Psych: Normal affect. HEENT:  Normal  Neck: Supple without bruits or JVD. Lungs:  Resp regular and unlabored, CTA. No hematoma Heart: RRR no s3, s4, or murmurs. Abdomen: Soft, non-tender, non-distended, BS + x 4.  Extremities: No clubbing, cyanosis or edema. DP/PT/Radials 2+ and equal bilaterally.  Accessory Clinical Findings  CBC No results for input(s): WBC, NEUTROABS, HGB, HCT, MCV, PLT in the last 72 hours. Basic Metabolic Panel No results for input(s): NA, K, CL, CO2, GLUCOSE, BUN, CREATININE, CALCIUM, MG, PHOS in  the last 72 hours. Liver Function Tests No results for input(s): AST, ALT, ALKPHOS, BILITOT, PROT, ALBUMIN in the last 72 hours. No results for input(s): LIPASE, AMYLASE in the last 72 hours. Cardiac Enzymes No results for input(s): CKTOTAL, CKMB, CKMBINDEX, TROPONINI in the last 72 hours. BNP Invalid input(s): POCBNP D-Dimer No results for input(s): DDIMER in the last 72 hours. Hemoglobin A1C No results for input(s): HGBA1C in the last 72 hours. Fasting Lipid Panel No results for input(s): CHOL, HDL, LDLCALC, TRIG, CHOLHDL, LDLDIRECT in the last 72 hours. Thyroid Function Tests No results for input(s): TSH, T4TOTAL, T3FREE, THYROIDAB in the last 72 hours.  Invalid input(s): FREET3  TELE  nsr with ventricular pacing.  ECG  nsr with ventricular pacing  Radiology/Studies  Dg Chest 2 View  02/06/2014   CLINICAL DATA:  63 year old diabetic hypertensive male with sore arm post AICD placement yesterday. Initial encounter.  : CHEST  2 VIEW  COMPARISON:  12/18/2013.  FINDINGS: Interval placement of right atrial and left ventricular lead. Right ventricular AICD lead remains in place. No gross pneumothorax or findings to suggest postoperative hematoma.  Cardiomegaly.  Pulmonary vascular prominence superimposed upon chronic lung changes.  Mild  degenerative changes thoracic spine.  IMPRESSION: Interval placement of right atrial and left ventricular lead. Right ventricular AICD lead remains in place. No gross pneumothorax or findings to suggest postoperative hematoma.  Cardiomegaly.  Pulmonary vascular prominence superimposed upon chronic lung changes.   Electronically Signed   By: Chauncey Cruel M.D.   On: 02/06/2014 07:56    ASSESSMENT AND PLAN  1. S/p biV ICD upgrade 2. Chronic systolic heart failure 3. LBBB 4. VT Rec: ok for discharge. Usual followup. Continue outpatient medications. Low sodium diet.  Loras Grieshop,M.D.  02/06/2014 8:26 AM

## 2014-02-08 ENCOUNTER — Encounter (HOSPITAL_COMMUNITY): Payer: Self-pay | Admitting: Internal Medicine

## 2014-02-15 ENCOUNTER — Ambulatory Visit (INDEPENDENT_AMBULATORY_CARE_PROVIDER_SITE_OTHER): Payer: 59 | Admitting: *Deleted

## 2014-02-15 DIAGNOSIS — I255 Ischemic cardiomyopathy: Secondary | ICD-10-CM

## 2014-02-15 LAB — MDC_IDC_ENUM_SESS_TYPE_INCLINIC
Battery Remaining Longevity: 97 mo
Brady Statistic AP VP Percent: 0.14 %
Brady Statistic AP VS Percent: 2.78 %
Brady Statistic AS VS Percent: 2.95 %
Brady Statistic RV Percent Paced: 94.2 %
Date Time Interrogation Session: 20151217171750
HIGH POWER IMPEDANCE MEASURED VALUE: 171 Ohm
HighPow Impedance: 41 Ohm
HighPow Impedance: 57 Ohm
Lead Channel Impedance Value: 380 Ohm
Lead Channel Impedance Value: 456 Ohm
Lead Channel Pacing Threshold Amplitude: 0.5 V
Lead Channel Pacing Threshold Amplitude: 0.5 V
Lead Channel Pacing Threshold Pulse Width: 0.4 ms
Lead Channel Pacing Threshold Pulse Width: 0.4 ms
Lead Channel Sensing Intrinsic Amplitude: 4.875 mV
Lead Channel Sensing Intrinsic Amplitude: 5.375 mV
Lead Channel Sensing Intrinsic Amplitude: 6.5 mV
Lead Channel Setting Pacing Amplitude: 3.5 V
Lead Channel Setting Pacing Pulse Width: 0.4 ms
Lead Channel Setting Sensing Sensitivity: 0.3 mV
MDC IDC MSMT BATTERY VOLTAGE: 3.11 V
MDC IDC MSMT LEADCHNL RV PACING THRESHOLD AMPLITUDE: 0.75 V
MDC IDC MSMT LEADCHNL RV PACING THRESHOLD PULSEWIDTH: 0.4 ms
MDC IDC MSMT LEADCHNL RV SENSING INTR AMPL: 6.125 mV
MDC IDC SET LEADCHNL LV PACING AMPLITUDE: 2.75 V
MDC IDC SET LEADCHNL RV PACING AMPLITUDE: 2 V
MDC IDC SET LEADCHNL RV PACING PULSEWIDTH: 0.4 ms
MDC IDC SET ZONE DETECTION INTERVAL: 350 ms
MDC IDC SET ZONE DETECTION INTERVAL: 450 ms
MDC IDC STAT BRADY AS VP PERCENT: 94.12 %
MDC IDC STAT BRADY RA PERCENT PACED: 2.93 %
Zone Setting Detection Interval: 240 ms
Zone Setting Detection Interval: 300 ms
Zone Setting Detection Interval: 380 ms

## 2014-02-15 NOTE — Progress Notes (Signed)
Wound check appointment. Steri-strips removed. Wound without redness or edema. Incision edges approximated, wound well healed. Normal device function. Thresholds, sensing, and impedances consistent with implant measurements. Device programmed at 3.5V for extra safety margin until 3 month visit. Histogram distribution appropriate for patient and level of activity. No mode switches or ventricular arrhythmias noted. RV output set sub-threshold to allow LV pacing only.  Patient educated about wound care, arm mobility, lifting restrictions, shock plan. ROV in 3 months with implanting physician.

## 2014-02-19 ENCOUNTER — Other Ambulatory Visit: Payer: Self-pay | Admitting: Internal Medicine

## 2014-02-27 ENCOUNTER — Other Ambulatory Visit: Payer: Self-pay | Admitting: Cardiology

## 2014-02-27 MED ORDER — LOSARTAN POTASSIUM 50 MG PO TABS: 50.0000 mg | ORAL_TABLET | Freq: Every day | ORAL | Status: DC

## 2014-03-12 ENCOUNTER — Encounter: Payer: Self-pay | Admitting: Internal Medicine

## 2014-03-15 ENCOUNTER — Encounter (HOSPITAL_COMMUNITY): Payer: Self-pay | Admitting: Cardiology

## 2014-04-19 NOTE — Progress Notes (Signed)
HPI: FU CAD (s/p prior PCI of RCA and LAD), ICM, systolic CHF, VTach s/p ICD Rx 04/2010 (Sotalol added to regimen), [name], HTN, HL, CKD. LHC 7/06: EF 35-40%, mLAD 99%, dLAD 90%, mid to dist CFX 70%, RCA occluded. PCI: DES to mid and dist LAD. Carotids 4/12: 0-39% bilat ICA. Previous atrial flutter ablation. Echocardiogram March 2015 showed an ejection fraction of 20-25%. There was mild left atrial enlargement and mild mitral regurgitation. Mild right ventricular enlargement and mild tricuspid regurgitation. Nuclear study October 2015 showed an ejection fraction of 30%. There was scar but no ischemia. Has had problems with acute on chronic systolic congestive heart failure recently. Recently had upgrade of his ICD to a biventricular device. Since he was last seen, he denies dyspnea, chest pain, palpitations or syncope.  Current Outpatient Prescriptions  Medication Sig Dispense Refill  . aspirin EC 81 MG EC tablet Take 1 tablet (81 mg total) by mouth daily.    Marland Kitchen atorvastatin (LIPITOR) 80 MG tablet Take 1 tablet (80 mg total) by mouth daily. 90 tablet 3  . furosemide (LASIX) 80 MG tablet Take 1 tablet (80 mg total) by mouth daily. 90 tablet 3  . insulin NPH-insulin regular (NOVOLIN 70/30 RELION) (70-30) 100 UNIT/ML injection Inject 15 Units into the skin 2 (two) times daily with a meal. 10 mL 12  . losartan (COZAAR) 50 MG tablet Take 1 tablet (50 mg total) by mouth daily. 30 tablet 1  . magnesium oxide (MAG-OX) 400 MG tablet Take 1 tablet (400 mg total) by mouth daily. 90 tablet 3  . metolazone (ZAROXOLYN) 2.5 MG tablet Take 1 tablet (2.5 mg total) by mouth 3 (three) times a week. Monday, Wednesday and Friday. Take 1 tablet 30 mins before taking furosemide 30 tablet 0  . Multiple Vitamins-Minerals (MULTIVITAMIN PO) Take 1 capsule by mouth daily.     . sotalol (BETAPACE) 120 MG tablet Take 120 mg by mouth 2 (two) times daily.    . sotalol (BETAPACE) 120 MG tablet TAKE ONE TABLET BY MOUTH TWICE DAILY  180 tablet 0   No current facility-administered medications for this visit.     Past Medical History  Diagnosis Date  . Ischemic cardiomyopathy March 2015    20-25% 2D   . HTN (hypertension)   . Hypercholesteremia     ablation  . Ventricular tachycardia   . CAD (coronary artery disease) 0350,0938 X 2     RCA-T, 70% PL (off CFX), 99% Prox LAD/90% Dist LAD, S/P TAXUS stent x 2  . ICD (implantable cardiac defibrillator) in place   . CKD (chronic kidney disease)   . Nephrolithiasis   . Atrial flutter 04/2012    s/p TEE-EPS+RFCA 04/2012  . Chronic anticoagulation   . Chronic systolic heart failure   . CHF (congestive heart failure)   . AICD (automatic cardioverter/defibrillator) present 02/05/2014    Upgrade to Medtronic biventricular ICD, serial number  [id number]   . DM type 2 (diabetes mellitus, type 2)     insulin dependent    Past Surgical History  Procedure Laterality Date  . Percutaneous coronary stent intervention (pci-s)  January 2002    PTCA/Stent Distal RCA  . Cardiac defibrillator placement  2007     Medtronic Maximo VR, serial number [id number]  . Percutaneous coronary stent intervention (pci-s)  June 2002    PTCA/Stent x 3 RCA, thrombolysis - failed  . Percutaneous coronary stent intervention (pci-s)  July 2006    TAXUS stents to  prox and distal LAD  . Biv icd genertaor change out  02/05/2014    Upgrade to Medtronic biventricular ICD, serial number  [id number] by Dr. [name]  . Atrial flutter ablation N/A 05/19/2012    Procedure: ATRIAL FLUTTER ABLATION;  Surgeon: Thompson Grayer, MD;  Location: [facility] CATH LAB;  Service: Cardiovascular;  Laterality: N/A;  . Bi-ventricular implantable cardioverter defibrillator upgrade N/A 02/05/2014    Procedure: BI-VENTRICULAR IMPLANTABLE CARDIOVERTER DEFIBRILLATOR UPGRADE;  Surgeon: Evans Lance, MD;  Location: [facility] CATH LAB;  Service: Cardiovascular;  Laterality: N/A;    History   Social History  . Marital Status: Married     Spouse Name: N/A  . Number of Children: 3  . 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 Smoking status: Former Smoker    Quit date: 03/02/2000  . Smokeless tobacco: Never Used  . Alcohol Use: No  . Drug Use: No  . Sexual Activity: Not on file   Other Topics Concern  . Not on file   Social History Narrative   Pt lives with wife. He hunts regularly and is active, walking > 1 mile at times without difficulty.    ROS: no fevers or chills, productive cough, hemoptysis, dysphasia, odynophagia, melena, hematochezia, dysuria, hematuria, rash, seizure activity, orthopnea, PND, pedal edema, claudication. Remaining systems are negative.  Physical Exam: Well-developed well-nourished in no acute distress.  Skin is warm and dry.  HEENT is normal.  Neck is supple.  Chest is clear to auscultation with normal expansion.  Cardiovascular exam is regular rate and rhythm.  Abdominal exam nontender or distended. No masses palpated. Extremities show no edema. neuro grossly intact

## 2014-04-20 ENCOUNTER — Ambulatory Visit (INDEPENDENT_AMBULATORY_CARE_PROVIDER_SITE_OTHER): Payer: 59 | Admitting: Cardiology

## 2014-04-20 ENCOUNTER — Encounter: Payer: Self-pay | Admitting: Cardiology

## 2014-04-20 VITALS — BP 90/40 | HR 50 | Ht 71.0 in | Wt 219.6 lb

## 2014-04-20 DIAGNOSIS — I255 Ischemic cardiomyopathy: Secondary | ICD-10-CM

## 2014-04-20 DIAGNOSIS — I10 Essential (primary) hypertension: Secondary | ICD-10-CM

## 2014-04-20 DIAGNOSIS — I4729 Other ventricular tachycardia: Secondary | ICD-10-CM

## 2014-04-20 DIAGNOSIS — I472 Ventricular tachycardia: Secondary | ICD-10-CM

## 2014-04-20 LAB — BASIC METABOLIC PANEL WITH GFR
BUN: 31 mg/dL — ABNORMAL HIGH (ref 6–23)
CO2: 28 mEq/L (ref 19–32)
Calcium: 9.7 mg/dL (ref 8.4–10.5)
Chloride: 89 mEq/L — ABNORMAL LOW (ref 96–112)
Creat: 1.29 mg/dL (ref 0.50–1.35)
GFR, EST AFRICAN AMERICAN: 67 mL/min
GFR, Est Non African American: 58 mL/min — ABNORMAL LOW
Glucose, Bld: 428 mg/dL — ABNORMAL HIGH (ref 70–99)
POTASSIUM: 4.1 meq/L (ref 3.5–5.3)
SODIUM: 131 meq/L — AB (ref 135–145)

## 2014-04-20 MED ORDER — CARVEDILOL 3.125 MG PO TABS: 3.1250 mg | ORAL_TABLET | Freq: Two times a day (BID) | ORAL | Status: DC

## 2014-04-20 MED ORDER — LOSARTAN POTASSIUM 25 MG PO TABS: 25.0000 mg | ORAL_TABLET | Freq: Every day | ORAL | Status: DC

## 2014-04-20 NOTE — Assessment & Plan Note (Signed)
Followed by electrophysiology. 

## 2014-04-20 NOTE — Assessment & Plan Note (Signed)
euvolemic on examination. Continue present dose of diuretics. Check potassium and renal function. 

## 2014-04-20 NOTE — Assessment & Plan Note (Signed)
Blood pressure is borderline. I would like for him to be on a beta blocker if tolerated. Decrease Cozaar to 25 mg daily. Add Coreg 3.125 mg twice a day. Titrate medications later as tolerated by blood pressure.

## 2014-04-20 NOTE — Assessment & Plan Note (Signed)
Continue statin. 

## 2014-04-20 NOTE — Assessment & Plan Note (Signed)
Continue aspirin and statin. 

## 2014-04-20 NOTE — Assessment & Plan Note (Signed)
Continue sotalol

## 2014-04-20 NOTE — Patient Instructions (Signed)
Your physician recommends that you schedule a follow-up appointment in: 3 MONTHS WITH DR Colville TO 25 MG ONCE DAILY= 1/2 OF THE 50 MG TABLET ONCE DAILY  START CARVEDILOL 3.125 MG ONE TABLET TWICE DAILY  Your physician recommends that you HAVE LAB WORK TODAY

## 2014-04-20 NOTE — Assessment & Plan Note (Signed)
Status post ablation. 

## 2014-04-30 ENCOUNTER — Ambulatory Visit: Payer: Self-pay | Admitting: Internal Medicine

## 2014-04-30 DIAGNOSIS — I4892 Unspecified atrial flutter: Secondary | ICD-10-CM

## 2014-05-15 ENCOUNTER — Other Ambulatory Visit: Payer: Self-pay | Admitting: Physician Assistant

## 2014-05-22 ENCOUNTER — Other Ambulatory Visit: Payer: Self-pay

## 2014-05-22 DIAGNOSIS — E7800 Pure hypercholesterolemia, unspecified: Secondary | ICD-10-CM

## 2014-05-22 MED ORDER — ATORVASTATIN CALCIUM 80 MG PO TABS: 80.0000 mg | ORAL_TABLET | Freq: Every day | ORAL | Status: DC

## 2014-05-24 ENCOUNTER — Encounter: Payer: Self-pay | Admitting: Internal Medicine

## 2014-05-24 ENCOUNTER — Ambulatory Visit (INDEPENDENT_AMBULATORY_CARE_PROVIDER_SITE_OTHER): Payer: 59 | Admitting: Internal Medicine

## 2014-05-24 VITALS — BP 84/52 | HR 63 | Ht 71.0 in | Wt 228.8 lb

## 2014-05-24 DIAGNOSIS — I472 Ventricular tachycardia: Secondary | ICD-10-CM

## 2014-05-24 DIAGNOSIS — I519 Heart disease, unspecified: Secondary | ICD-10-CM

## 2014-05-24 DIAGNOSIS — Z95810 Presence of automatic (implantable) cardiac defibrillator: Secondary | ICD-10-CM

## 2014-05-24 DIAGNOSIS — I4892 Unspecified atrial flutter: Secondary | ICD-10-CM | POA: Diagnosis not present

## 2014-05-24 DIAGNOSIS — I255 Ischemic cardiomyopathy: Secondary | ICD-10-CM | POA: Diagnosis not present

## 2014-05-24 DIAGNOSIS — E7800 Pure hypercholesterolemia, unspecified: Secondary | ICD-10-CM

## 2014-05-24 DIAGNOSIS — I4729 Other ventricular tachycardia: Secondary | ICD-10-CM

## 2014-05-24 DIAGNOSIS — I5022 Chronic systolic (congestive) heart failure: Secondary | ICD-10-CM

## 2014-05-24 LAB — MDC_IDC_ENUM_SESS_TYPE_INCLINIC
Battery Voltage: 3.08 V
Brady Statistic AP VP Percent: 0.3 %
Brady Statistic AP VS Percent: 4.85 %
Brady Statistic AS VP Percent: 85.89 %
Brady Statistic AS VS Percent: 8.96 %
Brady Statistic RA Percent Paced: 5.15 %
HighPow Impedance: 190 Ohm
HighPow Impedance: 48 Ohm
HighPow Impedance: 61 Ohm
Lead Channel Impedance Value: 380 Ohm
Lead Channel Impedance Value: 494 Ohm
Lead Channel Pacing Threshold Amplitude: 0.75 V
Lead Channel Pacing Threshold Amplitude: 1.25 V
Lead Channel Pacing Threshold Pulse Width: 0.4 ms
Lead Channel Pacing Threshold Pulse Width: 0.4 ms
Lead Channel Sensing Intrinsic Amplitude: 4 mV
Lead Channel Sensing Intrinsic Amplitude: 9.25 mV
Lead Channel Setting Pacing Amplitude: 2 V
Lead Channel Setting Pacing Pulse Width: 0.4 ms
Lead Channel Setting Sensing Sensitivity: 0.3 mV
MDC IDC MSMT BATTERY REMAINING LONGEVITY: 111 mo
MDC IDC MSMT LEADCHNL LV PACING THRESHOLD AMPLITUDE: 0.5 V
MDC IDC MSMT LEADCHNL LV PACING THRESHOLD PULSEWIDTH: 0.4 ms
MDC IDC SESS DTM: 20160324123728
MDC IDC SET LEADCHNL LV PACING PULSEWIDTH: 0.4 ms
MDC IDC SET LEADCHNL RA PACING AMPLITUDE: 1.5 V
MDC IDC SET LEADCHNL RV PACING AMPLITUDE: 0.75 V
MDC IDC SET ZONE DETECTION INTERVAL: 300 ms
MDC IDC SET ZONE DETECTION INTERVAL: 350 ms
MDC IDC STAT BRADY RV PERCENT PACED: 85.24 %
Zone Setting Detection Interval: 240 ms
Zone Setting Detection Interval: 380 ms
Zone Setting Detection Interval: 450 ms

## 2014-05-24 MED ORDER — ATORVASTATIN CALCIUM 80 MG PO TABS: 80.0000 mg | ORAL_TABLET | Freq: Every day | ORAL | Status: DC

## 2014-05-24 NOTE — Assessment & Plan Note (Signed)
He has had a single episode of VT. He will contiue his current meds.

## 2014-05-24 NOTE — Patient Instructions (Addendum)
Your physician wants you to follow-up in: 9 months with Dr Knox Saliva will receive a reminder letter in the mail two months in advance. If you don't receive a letter, please call our office to schedule the follow-up appointment.   Remote monitoring is used to monitor your Pacemaker of ICD from home. This monitoring reduces the number of office visits required to check your device to one time per year. It allows [place] to keep an eye on the functioning of your device to ensure it is working properly. You are scheduled for a device check from home on 08/23/14. You may send your transmission at any time that day. If you have a wireless device, the transmission will be sent automatically. After your physician reviews your transmission, you will receive a postcard with your next transmission date.

## 2014-05-24 NOTE — Progress Notes (Signed)
HPI [name] returns today for followup. He is a pleasant 64 yo man with an ICM, chronic class 3, now class 2 CHF, and VT. He has no ICD shocks in over a year while on sotalol. He has done well and denies chest pain. No syncope. No ICD shock. He has had an improvement in his heart failure symptoms since his BiV ICD was placed. We have purposely made his RV output below capture so as not to RV apically pace the right ventricle. He has had a single episode of Ventricular tachycardia in the interim which was stopped with ATP.  Allergies  Allergen Reactions  . Penicillins Other (See Comments)    Unknown reaction     Current Outpatient Prescriptions  Medication Sig Dispense Refill  . aspirin EC 81 MG EC tablet Take 1 tablet (81 mg total) by mouth daily.    Marland Kitchen atorvastatin (LIPITOR) 80 MG tablet Take 1 tablet (80 mg total) by mouth daily. 90 tablet 3  . carvedilol (COREG) 3.125 MG tablet Take 1 tablet (3.125 mg total) by mouth 2 (two) times daily. 60 tablet 12  . furosemide (LASIX) 80 MG tablet Take 1 tablet (80 mg total) by mouth daily. 90 tablet 3  . insulin NPH-insulin regular (NOVOLIN 70/30 RELION) (70-30) 100 UNIT/ML injection Inject 15 Units into the skin 2 (two) times daily with a meal. 10 mL 12  . losartan (COZAAR) 25 MG tablet Take 1 tablet (25 mg total) by mouth daily. 30 tablet 12  . magnesium oxide (MAG-OX) 400 MG tablet Take 1 tablet (400 mg total) by mouth daily. 90 tablet 3  . metolazone (ZAROXOLYN) 2.5 MG tablet TAKE ONE TABLET BY MOUTH 30 MINUTES BEFORE TAKING FUROSEMIDE 30 tablet 3  . Multiple Vitamins-Minerals (MULTIVITAMIN PO) Take 1 capsule by mouth daily.     . sotalol (BETAPACE) 120 MG tablet Take 120 mg by mouth 2 (two) times daily.    . sotalol (BETAPACE) 120 MG tablet TAKE ONE TABLET BY MOUTH TWICE DAILY 180 tablet 0   No current facility-administered medications for this visit.     Past Medical History  Diagnosis Date  . Ischemic cardiomyopathy March 2015      20-25% 2D   . HTN (hypertension)   . Hypercholesteremia     ablation  . Ventricular tachycardia   . CAD (coronary artery disease) 5621,3086 X 2     RCA-T, 70% PL (off CFX), 99% Prox LAD/90% Dist LAD, S/P TAXUS stent x 2  . ICD (implantable cardiac defibrillator) in place   . CKD (chronic kidney disease)   . Nephrolithiasis   . Atrial flutter 04/2012    s/p TEE-EPS+RFCA 04/2012  . Chronic anticoagulation   . Chronic systolic heart failure   . CHF (congestive heart failure)   . AICD (automatic cardioverter/defibrillator) present 02/05/2014    Upgrade to Medtronic biventricular ICD, serial number  [id number]   . DM type 2 (diabetes mellitus, type 2)     insulin dependent    ROS:   All systems reviewed and negative except as noted in the HPI.   Past Surgical History  Procedure Laterality Date  . Percutaneous coronary stent intervention (pci-s)  January 2002    PTCA/Stent Distal RCA  . Cardiac defibrillator placement  2007     Medtronic Maximo VR, serial number [id number]  . Percutaneous coronary stent intervention (pci-s)  June 2002    PTCA/Stent x 3 RCA, thrombolysis - failed  . Percutaneous coronary stent  intervention (pci-s)  July 2006    TAXUS stents to prox and distal LAD  . Biv icd genertaor change out  02/05/2014    Upgrade to Medtronic biventricular ICD, serial number  [id number] by Dr. [name]  . Atrial flutter ablation N/A 05/19/2012    Procedure: ATRIAL FLUTTER ABLATION;  Surgeon: Thompson Grayer, MD;  Location: [facility] CATH LAB;  Service: Cardiovascular;  Laterality: N/A;  . Bi-ventricular implantable cardioverter defibrillator upgrade N/A 02/05/2014    Procedure: BI-VENTRICULAR IMPLANTABLE CARDIOVERTER DEFIBRILLATOR UPGRADE;  Surgeon: Evans Lance, MD;  Location: [facility] CATH LAB;  Service: Cardiovascular;  Laterality: N/A;     Family History  Problem Relation Age of Onset  . CAD Brother   . CAD Brother   . CAD Brother   . CAD Brother      History   Social  History  . Marital Status: Married    Spouse Name: N/A  . Number of Children: 3  . 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 Smoking status: Former Smoker    Quit date: 03/02/2000  . Smokeless tobacco: Never Used  . Alcohol Use: No  . Drug Use: No  . Sexual Activity: Not on file   Other Topics Concern  . Not on file   Social History Narrative   Pt lives with wife. He hunts regularly and is active, walking > 1 mile at times without difficulty.     BP 84/52 mmHg  Pulse 63  Ht 5\' 11"  (1.803 m)  Wt 228 lb 12.8 oz (103.783 kg)  BMI 31.93 kg/m2  Physical Exam:  Well appearing 64 yo man, NAD HEENT: Unremarkable Neck:  6 cm JVD, no thyromegally Back:  No CVA tenderness Lungs:  Clear with no wheezes HEART:  Regular rate rhythm, no murmurs, no rubs, no clicks Abd:  soft, positive bowel sounds, no organomegally, no rebound, no guarding Ext:  2 plus pulses, no edema, no cyanosis, no clubbing Skin:  No rashes no nodules Neuro:  CN II through XII intact, motor grossly intact   DEVICE  Normal device function.  See PaceArt for details. RV output remains subthreshold.  Assess/Plan:

## 2014-05-24 NOTE — Assessment & Plan Note (Signed)
His medtronic BiV ICD is working normally. Will recheck in several months.  

## 2014-05-24 NOTE — Assessment & Plan Note (Signed)
His symptoms are class 2. He will continue his current meds. I asked him to reduce his sodium intake.

## 2014-07-30 NOTE — Progress Notes (Signed)
HPI: FU CAD (s/p prior PCI of RCA and LAD), ICM, systolic CHF, VTach s/p ICD Rx 04/2010 (Sotalol added to regimen), [name], HTN, HL, CKD. LHC 7/06: EF 35-40%, mLAD 99%, dLAD 90%, mid to dist CFX 70%, RCA occluded. PCI: DES to mid and dist LAD. Carotids 4/12: 0-39% bilat ICA. Previous atrial flutter ablation. Echocardiogram March 2015 showed an ejection fraction of 20-25%. There was mild left atrial enlargement and mild mitral regurgitation. Mild right ventricular enlargement and mild tricuspid regurgitation. Nuclear study October 2015 showed an ejection fraction of 30%. There was scar but no ischemia. Has had problems with acute on chronic systolic congestive heart failure. Has had upgrade of his ICD to a biventricular device. Since he was last seen, the patient has dyspnea with more extreme activities but not with routine activities. It is relieved with rest. It is not associated with chest pain. There is no orthopnea, PND or pedal edema. There is no syncope or palpitations. There is no exertional chest pain.   Current Outpatient Prescriptions  Medication Sig Dispense Refill  . aspirin EC 81 MG EC tablet Take 1 tablet (81 mg total) by mouth daily.    Marland Kitchen atorvastatin (LIPITOR) 80 MG tablet Take 1 tablet (80 mg total) by mouth daily. 90 tablet 3  . carvedilol (COREG) 3.125 MG tablet Take 1 tablet (3.125 mg total) by mouth 2 (two) times daily. 60 tablet 12  . furosemide (LASIX) 80 MG tablet Take 1 tablet (80 mg total) by mouth daily. 90 tablet 3  . insulin NPH-insulin regular (NOVOLIN 70/30 RELION) (70-30) 100 UNIT/ML injection Inject 15 Units into the skin 2 (two) times daily with a meal. 10 mL 12  . losartan (COZAAR) 25 MG tablet Take 25 mg by mouth daily.    . magnesium oxide (MAG-OX) 400 MG tablet Take 1 tablet (400 mg total) by mouth daily. 90 tablet 3  . metolazone (ZAROXOLYN) 2.5 MG tablet TAKE ONE TABLET BY MOUTH 30 MINUTES BEFORE TAKING FUROSEMIDE 30 tablet 3  . Multiple Vitamins-Minerals  (MULTIVITAMIN PO) Take 1 capsule by mouth daily.     . sotalol (BETAPACE) 120 MG tablet Take 120 mg by mouth 2 (two) times daily.     No current facility-administered medications for this visit.     Past Medical History  Diagnosis Date  . Ischemic cardiomyopathy March 2015    20-25% 2D   . HTN (hypertension)   . Hypercholesteremia     ablation  . Ventricular tachycardia   . CAD (coronary artery disease) 2297,9892 X 2     RCA-T, 70% PL (off CFX), 99% Prox LAD/90% Dist LAD, S/P TAXUS stent x 2  . ICD (implantable cardiac defibrillator) in place   . CKD (chronic kidney disease)   . Nephrolithiasis   . Atrial flutter 04/2012    s/p TEE-EPS+RFCA 04/2012  . Chronic anticoagulation   . Chronic systolic heart failure   . CHF (congestive heart failure)   . AICD (automatic cardioverter/defibrillator) present 02/05/2014    Upgrade to Medtronic biventricular ICD, serial number  [id number]   . DM type 2 (diabetes mellitus, type 2)     insulin dependent    Past Surgical History  Procedure Laterality Date  . Percutaneous coronary stent intervention (pci-s)  January 2002    PTCA/Stent Distal RCA  . Cardiac defibrillator placement  2007     Medtronic Maximo VR, serial number [id number]  . Percutaneous coronary stent intervention (pci-s)  June 2002  PTCA/Stent x 3 RCA, thrombolysis - failed  . Percutaneous coronary stent intervention (pci-s)  July 2006    TAXUS stents to prox and distal LAD  . Biv icd genertaor change out  02/05/2014    Upgrade to Medtronic biventricular ICD, serial number  [id number] by Dr. [name]  . Atrial flutter ablation N/A 05/19/2012    Procedure: ATRIAL FLUTTER ABLATION;  Surgeon: Thompson Grayer, MD;  Location: [facility] CATH LAB;  Service: Cardiovascular;  Laterality: N/A;  . Bi-ventricular implantable cardioverter defibrillator upgrade N/A 02/05/2014    Procedure: BI-VENTRICULAR IMPLANTABLE CARDIOVERTER DEFIBRILLATOR UPGRADE;  Surgeon: Evans Lance, MD;  Location: [facility]  CATH LAB;  Service: Cardiovascular;  Laterality: N/A;    History   Social History  . Marital Status: Married    Spouse Name: N/A  . Number of Children: 3  . 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 Smoking status: Former Smoker    Quit date: 03/02/2000  . Smokeless tobacco: Never Used  . Alcohol Use: No  . Drug Use: No  . Sexual Activity: Not on file   Other Topics Concern  . Not on file   Social History Narrative   Pt lives with wife. He hunts regularly and is active, walking > 1 mile at times without difficulty.    ROS: no fevers or chills, productive cough, hemoptysis, dysphasia, odynophagia, melena, hematochezia, dysuria, hematuria, rash, seizure activity, orthopnea, PND, pedal edema, claudication. Remaining systems are negative.  Physical Exam: Well-developed well-nourished in no acute distress.  Skin is warm and dry.  HEENT is normal.  Neck is supple.  Chest is clear to auscultation with normal expansion.  Cardiovascular exam is regular rate and rhythm.  Abdominal exam nontender or distended. No masses palpated. Extremities show no edema. neuro grossly intact  ECG sinus rhythm with first-degree AV block and ventricular pacing.

## 2014-07-31 ENCOUNTER — Encounter: Payer: Self-pay | Admitting: Cardiology

## 2014-07-31 ENCOUNTER — Ambulatory Visit (INDEPENDENT_AMBULATORY_CARE_PROVIDER_SITE_OTHER): Payer: 59 | Admitting: Cardiology

## 2014-07-31 VITALS — BP 90/62 | HR 61 | Ht 71.0 in | Wt 228.0 lb

## 2014-07-31 DIAGNOSIS — Z95810 Presence of automatic (implantable) cardiac defibrillator: Secondary | ICD-10-CM

## 2014-07-31 DIAGNOSIS — I4729 Other ventricular tachycardia: Secondary | ICD-10-CM

## 2014-07-31 DIAGNOSIS — I2510 Atherosclerotic heart disease of native coronary artery without angina pectoris: Secondary | ICD-10-CM | POA: Diagnosis not present

## 2014-07-31 DIAGNOSIS — I472 Ventricular tachycardia: Secondary | ICD-10-CM | POA: Diagnosis not present

## 2014-07-31 DIAGNOSIS — I255 Ischemic cardiomyopathy: Secondary | ICD-10-CM

## 2014-07-31 DIAGNOSIS — I10 Essential (primary) hypertension: Secondary | ICD-10-CM | POA: Diagnosis not present

## 2014-07-31 DIAGNOSIS — E7800 Pure hypercholesterolemia, unspecified: Secondary | ICD-10-CM

## 2014-07-31 DIAGNOSIS — I483 Typical atrial flutter: Secondary | ICD-10-CM

## 2014-07-31 DIAGNOSIS — I5022 Chronic systolic (congestive) heart failure: Secondary | ICD-10-CM

## 2014-07-31 LAB — BASIC METABOLIC PANEL WITH GFR
BUN: 23 mg/dL (ref 6–23)
CHLORIDE: 94 meq/L — AB (ref 96–112)
CO2: 32 meq/L (ref 19–32)
Calcium: 9.5 mg/dL (ref 8.4–10.5)
Creat: 1.29 mg/dL (ref 0.50–1.35)
GFR, EST AFRICAN AMERICAN: 67 mL/min
GFR, Est Non African American: 58 mL/min — ABNORMAL LOW
GLUCOSE: 249 mg/dL — AB (ref 70–99)
POTASSIUM: 3.8 meq/L (ref 3.5–5.3)
SODIUM: 135 meq/L (ref 135–145)

## 2014-07-31 NOTE — Assessment & Plan Note (Signed)
Continue aspirin and statin. 

## 2014-07-31 NOTE — Assessment & Plan Note (Signed)
Continue sotalol

## 2014-07-31 NOTE — Assessment & Plan Note (Addendum)
Continue ARB and beta blocker. 

## 2014-07-31 NOTE — Assessment & Plan Note (Signed)
Followed by electrophysiology. 

## 2014-07-31 NOTE — Assessment & Plan Note (Signed)
Patient is euvolemic on examination. Continue present dose of diabetics. Patient instructed on low sodium diet and fluid restriction. He will weigh himself daily and take an additional 40 mg of Lasix for weight gain of 2-3 pounds. Check potassium and renal function.

## 2014-07-31 NOTE — Patient Instructions (Signed)
Your physician wants you to follow-up in: 6 MONTHS WITH DR CRENSHAW You will receive a reminder letter in the mail two months in advance. If you don't receive a letter, please call our office to schedule the follow-up appointment.   Your physician recommends that you HAVE LAB WORK TODAY 

## 2014-07-31 NOTE — Assessment & Plan Note (Signed)
Blood pressure controlled. Continue present medications. 

## 2014-07-31 NOTE — Assessment & Plan Note (Signed)
Continue statin. 

## 2014-07-31 NOTE — Assessment & Plan Note (Signed)
Status post ablation. 

## 2014-08-15 ENCOUNTER — Other Ambulatory Visit: Payer: Self-pay | Admitting: Internal Medicine

## 2014-08-23 ENCOUNTER — Telehealth: Payer: Self-pay | Admitting: Cardiology

## 2014-08-23 ENCOUNTER — Ambulatory Visit (INDEPENDENT_AMBULATORY_CARE_PROVIDER_SITE_OTHER): Payer: 59 | Admitting: *Deleted

## 2014-08-23 DIAGNOSIS — I255 Ischemic cardiomyopathy: Secondary | ICD-10-CM

## 2014-08-23 NOTE — Telephone Encounter (Signed)
LMOVM reminding pt to send remote transmission.   

## 2014-08-25 ENCOUNTER — Encounter: Payer: Self-pay | Admitting: Internal Medicine

## 2014-08-25 DIAGNOSIS — I255 Ischemic cardiomyopathy: Secondary | ICD-10-CM | POA: Diagnosis not present

## 2014-08-27 ENCOUNTER — Other Ambulatory Visit: Payer: Self-pay

## 2014-08-27 NOTE — Progress Notes (Signed)
Remote ICD transmission.   

## 2014-08-30 LAB — CUP PACEART REMOTE DEVICE CHECK
Battery Remaining Longevity: 109 mo
Battery Voltage: 3.03 V
Brady Statistic AP VP Percent: 0.2 %
Brady Statistic RA Percent Paced: 7.92 %
Brady Statistic RV Percent Paced: 84.36 %
HIGH POWER IMPEDANCE MEASURED VALUE: 54 Ohm
HighPow Impedance: 71 Ohm
Lead Channel Impedance Value: 437 Ohm
Lead Channel Impedance Value: 456 Ohm
Lead Channel Impedance Value: 513 Ohm
Lead Channel Impedance Value: 627 Ohm
Lead Channel Impedance Value: 665 Ohm
Lead Channel Impedance Value: 722 Ohm
Lead Channel Impedance Value: 969 Ohm
Lead Channel Sensing Intrinsic Amplitude: 5.5 mV
Lead Channel Sensing Intrinsic Amplitude: 8.125 mV
Lead Channel Setting Pacing Amplitude: 1.5 V
Lead Channel Setting Pacing Amplitude: 2.25 V
Lead Channel Setting Pacing Pulse Width: 0.4 ms
Lead Channel Setting Pacing Pulse Width: 0.4 ms
Lead Channel Setting Sensing Sensitivity: 0.3 mV
MDC IDC MSMT LEADCHNL LV IMPEDANCE VALUE: 1083 Ohm
MDC IDC MSMT LEADCHNL LV IMPEDANCE VALUE: 1083 Ohm
MDC IDC MSMT LEADCHNL LV IMPEDANCE VALUE: 323 Ohm
MDC IDC MSMT LEADCHNL LV IMPEDANCE VALUE: 589 Ohm
MDC IDC MSMT LEADCHNL LV PACING THRESHOLD AMPLITUDE: 0.625 V
MDC IDC MSMT LEADCHNL LV PACING THRESHOLD PULSEWIDTH: 0.4 ms
MDC IDC MSMT LEADCHNL RA PACING THRESHOLD AMPLITUDE: 0.75 V
MDC IDC MSMT LEADCHNL RA PACING THRESHOLD PULSEWIDTH: 0.4 ms
MDC IDC MSMT LEADCHNL RA SENSING INTR AMPL: 5.5 mV
MDC IDC MSMT LEADCHNL RV IMPEDANCE VALUE: 342 Ohm
MDC IDC MSMT LEADCHNL RV IMPEDANCE VALUE: 437 Ohm
MDC IDC MSMT LEADCHNL RV PACING THRESHOLD AMPLITUDE: 0.875 V
MDC IDC MSMT LEADCHNL RV PACING THRESHOLD PULSEWIDTH: 0.4 ms
MDC IDC MSMT LEADCHNL RV SENSING INTR AMPL: 8.125 mV
MDC IDC SESS DTM: 20160625143827
MDC IDC SET LEADCHNL RV PACING AMPLITUDE: 0.75 V
MDC IDC SET ZONE DETECTION INTERVAL: 300 ms
MDC IDC SET ZONE DETECTION INTERVAL: 350 ms
MDC IDC SET ZONE DETECTION INTERVAL: 450 ms
MDC IDC STAT BRADY AP VS PERCENT: 7.72 %
MDC IDC STAT BRADY AS VP PERCENT: 85.15 %
MDC IDC STAT BRADY AS VS PERCENT: 6.93 %
Zone Setting Detection Interval: 240 ms
Zone Setting Detection Interval: 380 ms

## 2014-09-26 ENCOUNTER — Encounter: Payer: Self-pay | Admitting: *Deleted

## 2014-11-26 ENCOUNTER — Ambulatory Visit (INDEPENDENT_AMBULATORY_CARE_PROVIDER_SITE_OTHER): Payer: 59 | Admitting: *Deleted

## 2014-11-26 ENCOUNTER — Encounter: Payer: Self-pay | Admitting: Internal Medicine

## 2014-11-26 DIAGNOSIS — I255 Ischemic cardiomyopathy: Secondary | ICD-10-CM | POA: Diagnosis not present

## 2014-11-26 DIAGNOSIS — I5022 Chronic systolic (congestive) heart failure: Secondary | ICD-10-CM | POA: Diagnosis not present

## 2014-11-27 NOTE — Progress Notes (Signed)
Remote ICD transmission.   

## 2014-12-03 LAB — CUP PACEART REMOTE DEVICE CHECK
Battery Remaining Longevity: 105 mo
Battery Voltage: 3.02 V
Brady Statistic AS VS Percent: 17.93 %
Brady Statistic RA Percent Paced: 6.22 %
Brady Statistic RV Percent Paced: 75.37 %
HIGH POWER IMPEDANCE MEASURED VALUE: 43 Ohm
HIGH POWER IMPEDANCE MEASURED VALUE: 53 Ohm
Lead Channel Impedance Value: 304 Ohm
Lead Channel Impedance Value: 342 Ohm
Lead Channel Impedance Value: 399 Ohm
Lead Channel Impedance Value: 532 Ohm
Lead Channel Impedance Value: 589 Ohm
Lead Channel Impedance Value: 589 Ohm
Lead Channel Impedance Value: 646 Ohm
Lead Channel Impedance Value: 855 Ohm
Lead Channel Impedance Value: 950 Ohm
Lead Channel Pacing Threshold Amplitude: 0.625 V
Lead Channel Pacing Threshold Amplitude: 1 V
Lead Channel Pacing Threshold Pulse Width: 0.4 ms
Lead Channel Sensing Intrinsic Amplitude: 7.375 mV
Lead Channel Setting Pacing Amplitude: 0.75 V
Lead Channel Setting Pacing Amplitude: 1.75 V
Lead Channel Setting Pacing Pulse Width: 0.4 ms
Lead Channel Setting Pacing Pulse Width: 0.4 ms
Lead Channel Setting Sensing Sensitivity: 0.3 mV
MDC IDC MSMT LEADCHNL LV IMPEDANCE VALUE: 323 Ohm
MDC IDC MSMT LEADCHNL LV IMPEDANCE VALUE: 399 Ohm
MDC IDC MSMT LEADCHNL LV IMPEDANCE VALUE: 437 Ohm
MDC IDC MSMT LEADCHNL LV IMPEDANCE VALUE: 912 Ohm
MDC IDC MSMT LEADCHNL LV PACING THRESHOLD PULSEWIDTH: 0.4 ms
MDC IDC MSMT LEADCHNL RA PACING THRESHOLD AMPLITUDE: 0.75 V
MDC IDC MSMT LEADCHNL RA PACING THRESHOLD PULSEWIDTH: 0.4 ms
MDC IDC MSMT LEADCHNL RA SENSING INTR AMPL: 4.375 mV
MDC IDC MSMT LEADCHNL RA SENSING INTR AMPL: 4.375 mV
MDC IDC MSMT LEADCHNL RV SENSING INTR AMPL: 7.375 mV
MDC IDC SESS DTM: 20160926092607
MDC IDC SET LEADCHNL LV PACING AMPLITUDE: 2.25 V
MDC IDC SET ZONE DETECTION INTERVAL: 380 ms
MDC IDC STAT BRADY AP VP PERCENT: 0.08 %
MDC IDC STAT BRADY AP VS PERCENT: 6.13 %
MDC IDC STAT BRADY AS VP PERCENT: 75.85 %
Zone Setting Detection Interval: 240 ms
Zone Setting Detection Interval: 300 ms
Zone Setting Detection Interval: 350 ms
Zone Setting Detection Interval: 450 ms

## 2014-12-12 ENCOUNTER — Encounter: Payer: Self-pay | Admitting: Cardiology

## 2014-12-26 ENCOUNTER — Encounter: Payer: Self-pay | Admitting: Cardiology

## 2015-01-23 ENCOUNTER — Other Ambulatory Visit: Payer: Self-pay | Admitting: Cardiology

## 2015-01-23 NOTE — Telephone Encounter (Signed)
Rx request sent to pharmacy.  

## 2015-01-28 ENCOUNTER — Encounter: Payer: Self-pay | Admitting: Cardiology

## 2015-01-28 ENCOUNTER — Telehealth: Payer: Self-pay | Admitting: Cardiology

## 2015-01-28 ENCOUNTER — Encounter: Payer: Self-pay | Admitting: Nurse Practitioner

## 2015-01-28 ENCOUNTER — Ambulatory Visit (INDEPENDENT_AMBULATORY_CARE_PROVIDER_SITE_OTHER): Payer: 59 | Admitting: Nurse Practitioner

## 2015-01-28 ENCOUNTER — Ambulatory Visit: Payer: 59 | Admitting: Physician Assistant

## 2015-01-28 VITALS — BP 110/78 | HR 67 | Ht 70.0 in | Wt 235.0 lb

## 2015-01-28 DIAGNOSIS — E7800 Pure hypercholesterolemia, unspecified: Secondary | ICD-10-CM

## 2015-01-28 DIAGNOSIS — I2510 Atherosclerotic heart disease of native coronary artery without angina pectoris: Secondary | ICD-10-CM | POA: Diagnosis not present

## 2015-01-28 DIAGNOSIS — R0600 Dyspnea, unspecified: Secondary | ICD-10-CM

## 2015-01-28 DIAGNOSIS — I10 Essential (primary) hypertension: Secondary | ICD-10-CM | POA: Diagnosis not present

## 2015-01-28 DIAGNOSIS — I5023 Acute on chronic systolic (congestive) heart failure: Secondary | ICD-10-CM | POA: Diagnosis not present

## 2015-01-28 LAB — HEPATIC FUNCTION PANEL
ALT: 14 U/L (ref 9–46)
AST: 20 U/L (ref 10–35)
Albumin: 3.7 g/dL (ref 3.6–5.1)
Alkaline Phosphatase: 91 U/L (ref 40–115)
Bilirubin, Direct: 0.3 mg/dL — ABNORMAL HIGH (ref <=0.2)
Indirect Bilirubin: 0.8 mg/dL (ref 0.2–1.2)
Total Bilirubin: 1.1 mg/dL (ref 0.2–1.2)
Total Protein: 8.1 g/dL (ref 6.1–8.1)

## 2015-01-28 LAB — BASIC METABOLIC PANEL
BUN: 23 mg/dL (ref 7–25)
CO2: 29 mmol/L (ref 20–31)
Calcium: 9.7 mg/dL (ref 8.6–10.3)
Chloride: 99 mmol/L (ref 98–110)
Creat: 1.34 mg/dL — ABNORMAL HIGH (ref 0.70–1.25)
Glucose, Bld: 182 mg/dL — ABNORMAL HIGH (ref 65–99)
Potassium: 4.7 mmol/L (ref 3.5–5.3)
Sodium: 137 mmol/L (ref 135–146)

## 2015-01-28 LAB — CBC
HCT: 42 % (ref 39.0–52.0)
Hemoglobin: 13.5 g/dL (ref 13.0–17.0)
MCH: 28.4 pg (ref 26.0–34.0)
MCHC: 32.1 g/dL (ref 30.0–36.0)
MCV: 88.4 fL (ref 78.0–100.0)
MPV: 11.5 fL (ref 8.6–12.4)
Platelets: 224 10*3/uL (ref 150–400)
RBC: 4.75 MIL/uL (ref 4.22–5.81)
RDW: 15.1 % (ref 11.5–15.5)
WBC: 4.7 10*3/uL (ref 4.0–10.5)

## 2015-01-28 MED ORDER — METOLAZONE 2.5 MG PO TABS: 2.5000 mg | ORAL_TABLET | ORAL | Status: DC

## 2015-01-28 NOTE — Telephone Encounter (Signed)
Mr. Cillo is calling because he is feeling fatigue , and retaining more fluid and notice that he has put on a little more weight  Over the weekend . Please call  Thanks

## 2015-01-28 NOTE — Telephone Encounter (Signed)
Returned call to patient no answer.LMTC. 

## 2015-01-28 NOTE — Patient Instructions (Addendum)
We will be checking the following labs today - BMET, BNP, CBC, HPF   Medication Instructions:    Continue with your current medicines. BUT  Take Metolazone tomorrow - then go back to your normal schedule  Take Furosemide 80 mg two times a day (morning and early afternoon) for 3 days only - then back to just one a day    Testing/Procedures To Be Arranged:  Echocardiogram  Follow-Up:   See Dr. [name] in follow up after your echocardiogram.     Other Special Instructions:   Do not use Claritin D - no decongestants  Take Plain Claritin, plain Mucinex or Coricidin HBP products for cold symptoms    If you need a refill on your cardiac medications before your next appointment, please call your pharmacy.   Call the [place] office at [phone] if you have any questions, problems or concerns.

## 2015-01-28 NOTE — Progress Notes (Signed)
CARDIOLOGY OFFICE NOTE  Date:  01/28/2015    [name] [name] Date of Birth: 1950/04/16 Medical Record [id number]  PCP:  Leonard Downing, MD  Cardiologist:  [name]    Chief Complaint  Patient presents with  . Congestive Heart Failure    Work in visit - seen for Dr. Dennard Nip    History of Present Illness: [name] [name] is a 64 y.o. male who presents today for a work in visit. Seen for Dr. [name]. He has a known history of CAD (s/p prior PCI of RCA and LAD), ICM, systolic CHF, VTach s/p ICD Rx 04/2010 (Sotalol added to regimen), [name], HTN, HLD, & CKD. LHC 7/06: EF 35-40%, mLAD 99%, dLAD 90%, mid to dist CFX 70%, RCA occluded. PCI: DES to mid and dist LAD. Carotids 4/12: 0-39% bilat ICA. Previous atrial flutter ablation. Echocardiogram March 2015 showed an ejection fraction of 20-25%. There was mild left atrial enlargement and mild mitral regurgitation. Mild right ventricular enlargement and mild tricuspid regurgitation. Nuclear study October 2015 showed an ejection fraction of 30%. There was scar but no ischemia. Has had problems with acute on chronic systolic congestive heart failure. Has had upgrade of his ICD to a biventricular device.   Last seen in May by Dr. [name].   Phone call today -  Received a call from patient.He stated he has gained 7 lbs in the last 2 to 3 days.Stated breathing is heavy,no energy.Stated he has been following diet.Stated he would like to be seen.Appointmemt scheduled with Truitt Merle NP today at 2:00 pm at [facility] office.         Thus added to the FLEX for me.  Comes in today. Here alone. Lots of issues over the past 8 to 10 days.  His weight is up. His legs are swelling. He is short of breath at night. Has DOE. Swollen from knees down to his ankles. No energy - says his "get up and go, got up and went". 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 More easily fatigued.  Right shoulder pain. May have had some  extra salt but says his diet is stable. No early satiety. No real abdominal bloating. No ICD shocks reported. Has had allergies with sore throat/URI. He has been using Claritin D.     Past Medical History  Diagnosis Date  . Ischemic cardiomyopathy March 2015    20-25% 2D   . HTN (hypertension)   . Hypercholesteremia     ablation  . Ventricular tachycardia ([place])   . CAD (coronary artery disease) [id number] X 2     RCA-T, 70% PL (off CFX), 99% Prox LAD/90% Dist LAD, S/P TAXUS stent x 2  . ICD (implantable cardiac defibrillator) in place   . CKD (chronic kidney disease)   . Nephrolithiasis   . Atrial flutter ([place]) 04/2012    s/p TEE-EPS+RFCA 04/2012  . Chronic anticoagulation   . Chronic systolic heart failure ([place])   . CHF (congestive heart failure) ([place])   . AICD (automatic cardioverter/defibrillator) present 02/05/2014    Upgrade to Medtronic biventricular ICD, serial number  [id number]   . DM type 2 (diabetes mellitus, type 2) (HCC)     insulin dependent    Past Surgical History  Procedure Laterality Date  . Percutaneous coronary stent intervention (pci-s)  January 2002    PTCA/Stent Distal RCA  . Cardiac defibrillator placement  2007     Medtronic Maximo VR, serial number [id number]  .  Percutaneous coronary stent intervention (pci-s)  June 2002    PTCA/Stent x 3 RCA, thrombolysis - failed  . Percutaneous coronary stent intervention (pci-s)  July 2006    TAXUS stents to prox and distal LAD  . Biv icd genertaor change out  02/05/2014    Upgrade to Medtronic biventricular ICD, serial number  [id number] by Dr. [name]  . Atrial flutter ablation N/A 05/19/2012    Procedure: ATRIAL FLUTTER ABLATION;  Surgeon: Thompson Grayer, MD;  Location: [facility] CATH LAB;  Service: Cardiovascular;  Laterality: N/A;  . Bi-ventricular implantable cardioverter defibrillator upgrade N/A 02/05/2014    Procedure: BI-VENTRICULAR IMPLANTABLE CARDIOVERTER DEFIBRILLATOR UPGRADE;  Surgeon: Evans Lance, MD;   Location: [facility] CATH LAB;  Service: Cardiovascular;  Laterality: N/A;     Medications: Current Outpatient Prescriptions  Medication Sig Dispense Refill  . aspirin EC 81 MG EC tablet Take 1 tablet (81 mg total) by mouth daily.    Marland Kitchen atorvastatin (LIPITOR) 80 MG tablet Take 1 tablet (80 mg total) by mouth daily. 90 tablet 3  . carvedilol (COREG) 3.125 MG tablet Take 1 tablet (3.125 mg total) by mouth 2 (two) times daily. 60 tablet 12  . furosemide (LASIX) 80 MG tablet TAKE ONE TABLET BY MOUTH ONCE DAILY 90 tablet 0  . insulin NPH-insulin regular (NOVOLIN 70/30 RELION) (70-30) 100 UNIT/ML injection Inject 15 Units into the skin 2 (two) times daily with a meal. 10 mL 12  . losartan (COZAAR) 25 MG tablet Take 25 mg by mouth daily.    . magnesium oxide (MAG-OX) 400 MG tablet Take 1 tablet (400 mg total) by mouth daily. 90 tablet 3  . metolazone (ZAROXOLYN) 2.5 MG tablet Take 1 tablet (2.5 mg total) by mouth 3 (three) times a week. 30 tablet 3  . Multiple Vitamins-Minerals (MULTIVITAMIN PO) Take 1 capsule by mouth daily.     . sotalol (BETAPACE) 120 MG tablet TAKE ONE TABLET BY MOUTH TWICE DAILY 180 tablet 1   No current facility-administered medications for this visit.    Allergies: Allergies  Allergen Reactions  . Penicillins Other (See Comments)    Unknown reaction    Social History: The patient  reports that he quit smoking about 14 years ago. He has never used smokeless tobacco. He reports that he does not drink alcohol or use illicit drugs.   Family History: The patient's family history includes CAD in his brother, brother, brother, and brother.   Review of Systems: Please see the history of present illness.   Otherwise, the review of systems is positive for weight gain.   All other systems are reviewed and negative.   Physical Exam: VS:  BP 110/78 mmHg  Pulse 67  Ht 5\' 10"  (1.778 m)  Wt 235 lb (106.595 kg)  BMI 33.72 kg/m2  SpO2 99% .  BMI Body mass index is 33.72  kg/(m^2).  Wt Readings from Last 3 Encounters:  01/28/15 235 lb (106.595 kg)  07/31/14 228 lb (103.42 kg)  05/24/14 228 lb 12.8 oz (103.783 kg)    General: Pleasant. He is alert and in no acute distress. Weight is up 7 pounds.   HEENT: Normal but with missing teeth. Neck: Supple, no JVD, carotid bruits, or masses noted.  Cardiac: Regular rate and rhythm. No murmurs, rubs, or gallops. +1 edema in the lower legs.  Respiratory:  Lungs are clear to auscultation bilaterally with normal work of breathing.  GI: Soft and nontender.  MS: No deformity or atrophy. Gait and ROM intact. Skin:  Warm and dry. Color is normal.  Neuro:  Strength and sensation are intact and no gross focal deficits noted.  Psych: Alert, appropriate and with normal affect.   LABORATORY DATA:  EKG:  EKG is unchanged. Underlying sinus. V pacing.   Lab Results  Component Value Date   WBC 6.1 01/30/2014   HGB 12.2* 01/30/2014   HCT 38.4* 01/30/2014   PLT 168.0 01/30/2014   GLUCOSE 249* 07/31/2014   CHOL 146 09/13/2013   TRIG 71.0 09/13/2013   HDL 38.00* 09/13/2013   LDLDIRECT 197.0 07/10/2011   LDLCALC 94 09/13/2013   ALT 21 12/16/2013   AST 22 12/16/2013   NA 135 07/31/2014   K 3.8 07/31/2014   CL 94* 07/31/2014   CREATININE 1.29 07/31/2014   BUN 23 07/31/2014   CO2 32 07/31/2014   TSH 3.534 05/17/2012   INR 1.3* 01/30/2014   HGBA1C 11.5* 05/17/2012    BNP (last 3 results) No results for input(s): BNP in the last 8760 hours.  ProBNP (last 3 results) No results for input(s): PROBNP in the last 8760 hours.   Other Studies Reviewed Today:  Echo Study Conclusions from 04/2013  - Left ventricle: There is akinesis of the apical anterior wall and mid anteroseptum There is severe hypokinesis to akinesis of the entire inferior wall There is akinesis of the mid inferoseptum and apical septum. The cavity size was normal. There was mild concentric hypertrophy. Systolic function was severely  reduced. The estimated ejection fraction was in the range of 20% to 25%. Diffuse hypokinesis. There is akinesis of the entireapical myocardium. - Ventricular septum: Septal motion showed paradox. - Mitral valve: Mild regurgitation. - Left atrium: The atrium was mildly dilated. - Right ventricle: The cavity size was mildly dilated. Wall thickness was normal. - Tricuspid valve: Mild regurgitation.  Assessment/Plan: 1. Acute on chronic systolic HF - weight is up. Some mild swelling in his legs. Hard to say what is the trigger but clearly has had a change over the past 8 to 10 days or so. Will increase his Lasix to 80 mg BID for 3 days. Extra dose of Zaroxolyn tomorrow - that will give him 3 days in a row - then back to normal schedule. Will get his echo updated. He is overdue for his visit with Dr. [name] - will get that scheduled. He is to call by the end of the week if he fails to improve.   2. Ischemic CM  3. CAD - no active chest pain reported.   4. HTN - BP ok on current regimen.   5. Underlying ICD - to see EP next month - no shocks reported.   Current medicines are reviewed with the patient today.  The patient does not have concerns regarding medicines other than what has been noted above.  The following changes have been made:  See above.  Labs/ tests ordered today include:    Orders Placed This Encounter  Procedures  . Brain natriuretic peptide  . Basic metabolic panel  . CBC  . Hepatic function panel  . EKG 12-Lead  . ECHOCARDIOGRAM COMPLETE     Disposition:   FU    Patient is agreeable to this plan and will call if any problems develop in the interim.   Signed: Burtis Junes, RN, ANP-C 01/28/2015 2:53 PM  [facility] [address] [place], [place]  [address] Phone: [phone] Fax: [phone]

## 2015-01-28 NOTE — Telephone Encounter (Signed)
Received a call from patient.He stated he has gained 7 lbs in the last 2 to 3 days.Stated breathing is heavy,no energy.Stated he has been following diet.Stated he would like to be seen.Appointmemt scheduled with Truitt Merle NP today at 2:00 pm at [facility] office.

## 2015-01-29 LAB — BRAIN NATRIURETIC PEPTIDE: Brain Natriuretic Peptide: 372 pg/mL — ABNORMAL HIGH (ref 0.0–100.0)

## 2015-02-11 ENCOUNTER — Ambulatory Visit (HOSPITAL_COMMUNITY): Payer: 59 | Attending: Cardiology

## 2015-02-11 ENCOUNTER — Other Ambulatory Visit: Payer: Self-pay

## 2015-02-11 DIAGNOSIS — I5023 Acute on chronic systolic (congestive) heart failure: Secondary | ICD-10-CM

## 2015-02-11 DIAGNOSIS — R29898 Other symptoms and signs involving the musculoskeletal system: Secondary | ICD-10-CM | POA: Diagnosis not present

## 2015-02-11 DIAGNOSIS — I071 Rheumatic tricuspid insufficiency: Secondary | ICD-10-CM | POA: Insufficient documentation

## 2015-02-11 DIAGNOSIS — I10 Essential (primary) hypertension: Secondary | ICD-10-CM | POA: Insufficient documentation

## 2015-02-11 DIAGNOSIS — I358 Other nonrheumatic aortic valve disorders: Secondary | ICD-10-CM | POA: Diagnosis not present

## 2015-02-11 DIAGNOSIS — R0600 Dyspnea, unspecified: Secondary | ICD-10-CM

## 2015-02-11 DIAGNOSIS — E119 Type 2 diabetes mellitus without complications: Secondary | ICD-10-CM | POA: Insufficient documentation

## 2015-02-11 DIAGNOSIS — E785 Hyperlipidemia, unspecified: Secondary | ICD-10-CM | POA: Diagnosis not present

## 2015-02-11 DIAGNOSIS — I2510 Atherosclerotic heart disease of native coronary artery without angina pectoris: Secondary | ICD-10-CM | POA: Insufficient documentation

## 2015-02-11 DIAGNOSIS — E7800 Pure hypercholesterolemia, unspecified: Secondary | ICD-10-CM | POA: Diagnosis not present

## 2015-02-11 DIAGNOSIS — I509 Heart failure, unspecified: Secondary | ICD-10-CM | POA: Diagnosis present

## 2015-02-11 DIAGNOSIS — I517 Cardiomegaly: Secondary | ICD-10-CM | POA: Diagnosis not present

## 2015-02-20 ENCOUNTER — Ambulatory Visit (INDEPENDENT_AMBULATORY_CARE_PROVIDER_SITE_OTHER): Payer: 59 | Admitting: Internal Medicine

## 2015-02-20 ENCOUNTER — Encounter: Payer: Self-pay | Admitting: Internal Medicine

## 2015-02-20 VITALS — BP 88/58 | HR 60 | Ht 71.0 in | Wt 234.8 lb

## 2015-02-20 DIAGNOSIS — I255 Ischemic cardiomyopathy: Secondary | ICD-10-CM

## 2015-02-20 DIAGNOSIS — I472 Ventricular tachycardia, unspecified: Secondary | ICD-10-CM

## 2015-02-20 DIAGNOSIS — I509 Heart failure, unspecified: Secondary | ICD-10-CM

## 2015-02-20 DIAGNOSIS — I5022 Chronic systolic (congestive) heart failure: Secondary | ICD-10-CM | POA: Diagnosis not present

## 2015-02-20 DIAGNOSIS — Z95810 Presence of automatic (implantable) cardiac defibrillator: Secondary | ICD-10-CM | POA: Diagnosis not present

## 2015-02-20 DIAGNOSIS — I480 Paroxysmal atrial fibrillation: Secondary | ICD-10-CM

## 2015-02-20 DIAGNOSIS — I4729 Other ventricular tachycardia: Secondary | ICD-10-CM

## 2015-02-20 DIAGNOSIS — I5023 Acute on chronic systolic (congestive) heart failure: Secondary | ICD-10-CM

## 2015-02-20 LAB — CUP PACEART INCLINIC DEVICE CHECK
Brady Statistic AP VP Percent: 0.32 %
Brady Statistic AS VP Percent: 83.75 %
Brady Statistic AS VS Percent: 10.2 %
HighPow Impedance: 44 Ohm
HighPow Impedance: 55 Ohm
Implantable Lead Implant Date: 20071219
Implantable Lead Implant Date: 20151207
Implantable Lead Location: 753858
Implantable Lead Location: 753860
Implantable Lead Model: 6947
Lead Channel Impedance Value: 380 Ohm
Lead Channel Impedance Value: 399 Ohm
Lead Channel Impedance Value: 399 Ohm
Lead Channel Impedance Value: 836 Ohm
Lead Channel Impedance Value: 912 Ohm
Lead Channel Impedance Value: 950 Ohm
Lead Channel Pacing Threshold Amplitude: 0.5 V
Lead Channel Pacing Threshold Amplitude: 0.75 V
Lead Channel Pacing Threshold Pulse Width: 0.4 ms
Lead Channel Sensing Intrinsic Amplitude: 4.125 mV
Lead Channel Sensing Intrinsic Amplitude: 6.625 mV
Lead Channel Setting Sensing Sensitivity: 0.3 mV
MDC IDC LEAD IMPLANT DT: 20151207
MDC IDC LEAD LOCATION: 753859
MDC IDC LEAD MODEL: 4598
MDC IDC MSMT BATTERY REMAINING LONGEVITY: 104 mo
MDC IDC MSMT BATTERY VOLTAGE: 3.01 V
MDC IDC MSMT LEADCHNL LV IMPEDANCE VALUE: 304 Ohm
MDC IDC MSMT LEADCHNL LV IMPEDANCE VALUE: 532 Ohm
MDC IDC MSMT LEADCHNL LV IMPEDANCE VALUE: 589 Ohm
MDC IDC MSMT LEADCHNL LV IMPEDANCE VALUE: 627 Ohm
MDC IDC MSMT LEADCHNL LV IMPEDANCE VALUE: 627 Ohm
MDC IDC MSMT LEADCHNL LV PACING THRESHOLD PULSEWIDTH: 0.4 ms
MDC IDC MSMT LEADCHNL RV IMPEDANCE VALUE: 304 Ohm
MDC IDC MSMT LEADCHNL RV IMPEDANCE VALUE: 342 Ohm
MDC IDC MSMT LEADCHNL RV PACING THRESHOLD AMPLITUDE: 1 V
MDC IDC MSMT LEADCHNL RV PACING THRESHOLD PULSEWIDTH: 0.4 ms
MDC IDC SESS DTM: 20161221120301
MDC IDC SET LEADCHNL LV PACING AMPLITUDE: 2 V
MDC IDC SET LEADCHNL LV PACING PULSEWIDTH: 0.4 ms
MDC IDC SET LEADCHNL RA PACING AMPLITUDE: 1.5 V
MDC IDC SET LEADCHNL RV PACING AMPLITUDE: 0.75 V
MDC IDC SET LEADCHNL RV PACING PULSEWIDTH: 0.4 ms
MDC IDC STAT BRADY AP VS PERCENT: 5.73 %
MDC IDC STAT BRADY RA PERCENT PACED: 6.05 %
MDC IDC STAT BRADY RV PERCENT PACED: 83.3 %

## 2015-02-20 NOTE — Patient Instructions (Signed)
Medication Instructions:  Your physician recommends that you continue on your current medications as directed. Please refer to the Current Medication list given to you today.   Labwork: None ordered   Testing/Procedures: None ordered   Follow-Up: Your physician wants you to follow-up in: 12 months with Dr Taylor You will receive a reminder letter in the mail two months in advance. If you don't receive a letter, please call our office to schedule the follow-up appointment.  Remote monitoring is used to monitor your ICD from home. This monitoring reduces the number of office visits required to check your device to one time per year. It allows us to keep an eye on the functioning of your device to ensure it is working properly. You are scheduled for a device check from home on 05/22/15. You may send your transmission at any time that day. If you have a wireless device, the transmission will be sent automatically. After your physician reviews your transmission, you will receive a postcard with your next transmission date.     Any Other Special Instructions Will Be Listed Below (If Applicable).     If you need a refill on your cardiac medications before your next appointment, please call your pharmacy.   

## 2015-02-20 NOTE — Assessment & Plan Note (Signed)
His Medtronic BiV device is working normally. We will continue keeping his RV at submax pacing thresholds to avoid RV apical pacing.

## 2015-02-20 NOTE — Assessment & Plan Note (Signed)
His symptoms are currently well compensated and his optivol is down. He is encouraged to maintain a low sodium diet.

## 2015-02-20 NOTE — Assessment & Plan Note (Signed)
He is maintaining NSR. He will continue sotalol.

## 2015-02-20 NOTE — Assessment & Plan Note (Signed)
He has had no sustained VT for several months. He will continue his sotalol.

## 2015-02-20 NOTE — Progress Notes (Signed)
HPI [name] returns today for followup. He is a pleasant 64 yo man with an ICM, chronic class 3, now class 2 CHF, and VT. He has no ICD shocks in over a year while on sotalol. He has done well and denies chest pain. No syncope. No ICD shock. He has had an improvement in his heart failure symptoms since his BiV ICD was placed. We have purposely made his RV output below capture so as not to RV apically pace the right ventricle. He has had a single episode of Ventricular tachycardia in the interim which was stopped with ATP. He has also had some PAF and volume overload and had his meds adjusted. Allergies  Allergen Reactions  . Penicillins Other (See Comments)    Unknown reaction     Current Outpatient Prescriptions  Medication Sig Dispense Refill  . aspirin EC 81 MG EC tablet Take 1 tablet (81 mg total) by mouth daily.    Marland Kitchen atorvastatin (LIPITOR) 80 MG tablet Take 1 tablet (80 mg total) by mouth daily. 90 tablet 3  . carvedilol (COREG) 3.125 MG tablet Take 1 tablet (3.125 mg total) by mouth 2 (two) times daily. 60 tablet 12  . furosemide (LASIX) 80 MG tablet TAKE ONE TABLET BY MOUTH ONCE DAILY 90 tablet 0  . insulin NPH-insulin regular (NOVOLIN 70/30 RELION) (70-30) 100 UNIT/ML injection Inject 15 Units into the skin 2 (two) times daily with a meal. 10 mL 12  . losartan (COZAAR) 25 MG tablet Take 25 mg by mouth daily.    . magnesium oxide (MAG-OX) 400 MG tablet Take 1 tablet (400 mg total) by mouth daily. 90 tablet 3  . metolazone (ZAROXOLYN) 2.5 MG tablet Take 1 tablet (2.5 mg total) by mouth 3 (three) times a week. 30 tablet 3  . Multiple Vitamins-Minerals (MULTIVITAMIN PO) Take 1 capsule by mouth daily.     . sotalol (BETAPACE) 120 MG tablet TAKE ONE TABLET BY MOUTH TWICE DAILY 180 tablet 1   No current facility-administered medications for this visit.     Past Medical History  Diagnosis Date  . Ischemic cardiomyopathy March 2015    20-25% 2D   . HTN (hypertension)   .  Hypercholesteremia     ablation  . Ventricular tachycardia ([place])   . CAD (coronary artery disease) [id number] X 2     RCA-T, 70% PL (off CFX), 99% Prox LAD/90% Dist LAD, S/P TAXUS stent x 2  . ICD (implantable cardiac defibrillator) in place   . CKD (chronic kidney disease)   . Nephrolithiasis   . Atrial flutter ([place]) 04/2012    s/p TEE-EPS+RFCA 04/2012  . Chronic anticoagulation   . Chronic systolic heart failure ([place])   . CHF (congestive heart failure) ([place])   . AICD (automatic cardioverter/defibrillator) present 02/05/2014    Upgrade to Medtronic biventricular ICD, serial number  [id number]   . DM type 2 (diabetes mellitus, type 2) (HCC)     insulin dependent    ROS:   All systems reviewed and negative except as noted in the HPI.   Past Surgical History  Procedure Laterality Date  . Percutaneous coronary stent intervention (pci-s)  January 2002    PTCA/Stent Distal RCA  . Cardiac defibrillator placement  2007     Medtronic Maximo VR, serial number [id number]  . Percutaneous coronary stent intervention (pci-s)  June 2002    PTCA/Stent x 3 RCA, thrombolysis - failed  . Percutaneous coronary stent intervention (pci-s)  July  2006    TAXUS stents to prox and distal LAD  . Biv icd genertaor change out  02/05/2014    Upgrade to Medtronic biventricular ICD, serial number  [id number] by Dr. [name]  . Atrial flutter ablation N/A 05/19/2012    Procedure: ATRIAL FLUTTER ABLATION;  Surgeon: Thompson Grayer, MD;  Location: [facility] CATH LAB;  Service: Cardiovascular;  Laterality: N/A;  . Bi-ventricular implantable cardioverter defibrillator upgrade N/A 02/05/2014    Procedure: BI-VENTRICULAR IMPLANTABLE CARDIOVERTER DEFIBRILLATOR UPGRADE;  Surgeon: Evans Lance, MD;  Location: [facility] CATH LAB;  Service: Cardiovascular;  Laterality: N/A;     Family History  Problem Relation Age of Onset  . CAD Brother   . CAD Brother   . CAD Brother   . CAD Brother      Social History   Social History    . Marital Status: Married    Spouse Name: N/A  . Number of Children: 3  . 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 Smoking status: Former Smoker    Quit date: 03/02/2000  . Smokeless tobacco: Never Used  . Alcohol Use: No  . Drug Use: No  . Sexual Activity: Not on file   Other Topics Concern  . Not on file   Social History Narrative   Pt lives with wife. He hunts regularly and is active, walking > 1 mile at times without difficulty.     BP 88/58 mmHg  Pulse 60  Ht 5\' 11"  (1.803 m)  Wt 234 lb 12.8 oz (106.505 kg)  BMI 32.76 kg/m2  SpO2 99%  Physical Exam:  Well appearing 64 yo man, NAD HEENT: Unremarkable Neck:  6 cm JVD, no thyromegally Back:  No CVA tenderness Lungs:  Clear with no wheezes HEART:  Regular rate rhythm, no murmurs, no rubs, no clicks Abd:  soft, positive bowel sounds, no organomegally, no rebound, no guarding Ext:  2 plus pulses, no edema, no cyanosis, no clubbing Skin:  No rashes no nodules Neuro:  CN II through XII intact, motor grossly intact   DEVICE  Normal device function.  See PaceArt for details. RV output remains subthreshold.  Assess/Plan:

## 2015-03-05 ENCOUNTER — Other Ambulatory Visit: Payer: Self-pay | Admitting: Cardiology

## 2015-03-05 NOTE — Telephone Encounter (Signed)
Rx request sent to pharmacy.  

## 2015-03-06 NOTE — Progress Notes (Signed)
HPI: FU CAD (s/p prior PCI of RCA and LAD), ICM, systolic CHF, VTach s/p ICD Rx 04/2010 (Sotalol added to regimen), [name], HTN, HL, CKD. LHC 7/06: EF 35-40%, mLAD 99%, dLAD 90%, mid to dist CFX 70%, RCA occluded. PCI: DES to mid and dist LAD. Carotids 4/12: 0-39% bilat ICA. Previous atrial flutter ablation. Nuclear study October 2015 showed an ejection fraction of 30%. There was scar but no ischemia. Has had problems with acute on chronic systolic congestive heart failure. Has had upgrade of his ICD to a biventricular device. Echo 12/26 showed EF 20-25, restrictive fillling, RAE/RVE, LAE, mild TR. Recently seen for CHF; lasix increased. Since he was last seen, Patient has mild dyspnea on exertion but no orthopnea, PND, pedal edema, chest pain or syncope.  Current Outpatient Prescriptions  Medication Sig Dispense Refill  . aspirin EC 81 MG EC tablet Take 1 tablet (81 mg total) by mouth daily.    Marland Kitchen atorvastatin (LIPITOR) 80 MG tablet Take 1 tablet (80 mg total) by mouth daily. 90 tablet 3  . carvedilol (COREG) 3.125 MG tablet Take 1 tablet (3.125 mg total) by mouth 2 (two) times daily. 60 tablet 12  . furosemide (LASIX) 80 MG tablet TAKE ONE TABLET BY MOUTH ONCE DAILY 90 tablet 0  . insulin NPH-insulin regular (NOVOLIN 70/30 RELION) (70-30) 100 UNIT/ML injection Inject 15 Units into the skin 2 (two) times daily with a meal. 10 mL 12  . losartan (COZAAR) 25 MG tablet Take 25 mg by mouth daily.    . magnesium oxide (MAG-OX) 400 MG tablet Take 1 tablet (400 mg total) by mouth daily. 90 tablet 3  . metolazone (ZAROXOLYN) 2.5 MG tablet Take 1 tablet (2.5 mg total) by mouth 3 (three) times a week. 30 tablet 3  . Multiple Vitamins-Minerals (MULTIVITAMIN PO) Take 1 capsule by mouth daily.     . sotalol (BETAPACE) 120 MG tablet TAKE ONE TABLET BY MOUTH TWICE DAILY 180 tablet 1   No current facility-administered medications for this visit.     Past Medical History  Diagnosis Date  . Ischemic  cardiomyopathy March 2015    20-25% 2D   . HTN (hypertension)   . Hypercholesteremia     ablation  . Ventricular tachycardia ([place])   . CAD (coronary artery disease) [id number] X 2     RCA-T, 70% PL (off CFX), 99% Prox LAD/90% Dist LAD, S/P TAXUS stent x 2  . ICD (implantable cardiac defibrillator) in place   . CKD (chronic kidney disease)   . Nephrolithiasis   . Atrial flutter ([place]) 04/2012    s/p TEE-EPS+RFCA 04/2012  . Chronic anticoagulation   . Chronic systolic heart failure ([place])   . CHF (congestive heart failure) ([place])   . AICD (automatic cardioverter/defibrillator) present 02/05/2014    Upgrade to Medtronic biventricular ICD, serial number  [id number]   . DM type 2 (diabetes mellitus, type 2) (HCC)     insulin dependent    Past Surgical History  Procedure Laterality Date  . Percutaneous coronary stent intervention (pci-s)  January 2002    PTCA/Stent Distal RCA  . Cardiac defibrillator placement  2007     Medtronic Maximo VR, serial number [id number]  . Percutaneous coronary stent intervention (pci-s)  June 2002    PTCA/Stent x 3 RCA, thrombolysis - failed  . Percutaneous coronary stent intervention (pci-s)  July 2006    TAXUS stents to prox and distal LAD  . Biv icd genertaor change out  02/05/2014    Upgrade to Medtronic biventricular ICD, serial number  [id number] by Dr. [name]  . Atrial flutter ablation N/A 05/19/2012    Procedure: ATRIAL FLUTTER ABLATION;  Surgeon: Thompson Grayer, MD;  Location: [facility] CATH LAB;  Service: Cardiovascular;  Laterality: N/A;  . Bi-ventricular implantable cardioverter defibrillator upgrade N/A 02/05/2014    Procedure: BI-VENTRICULAR IMPLANTABLE CARDIOVERTER DEFIBRILLATOR UPGRADE;  Surgeon: Evans Lance, MD;  Location: [facility] CATH LAB;  Service: Cardiovascular;  Laterality: N/A;    Social History   Social History  . Marital Status: Married    Spouse Name: N/A  . Number of Children: 3  . Years of Education: N/A   Occupational History  .  brick layer - currently unemployed    Social History Main Topics  . Smoking status: Former Smoker    Quit date: 03/02/2000  . Smokeless tobacco: Never Used  . Alcohol Use: No  . Drug Use: No  . Sexual Activity: Not on file   Other Topics Concern  . Not on file   Social History Narrative   Pt lives with wife. He hunts regularly and is active, walking > 1 mile at times without difficulty.    ROS: no fevers or chills, productive cough, hemoptysis, dysphasia, odynophagia, melena, hematochezia, dysuria, hematuria, rash, seizure activity, orthopnea, PND, pedal edema, claudication. Remaining systems are negative.  Physical Exam: Well-developed well-nourished in no acute distress.  Skin is warm and dry.  HEENT is normal.  Neck is supple.  Chest is clear to auscultation with normal expansion.  Cardiovascular exam is regular rate and rhythm.  Abdominal exam nontender or distended. No masses palpated. Extremities show no edema. neuro grossly intact  ECG 01/28/2015-sinus rhythm, first-degree AV block, left bundle branch block.

## 2015-03-08 ENCOUNTER — Encounter: Payer: Self-pay | Admitting: Cardiology

## 2015-03-08 ENCOUNTER — Ambulatory Visit (INDEPENDENT_AMBULATORY_CARE_PROVIDER_SITE_OTHER): Payer: PPO | Admitting: Cardiology

## 2015-03-08 VITALS — BP 100/76 | HR 76 | Ht 71.0 in | Wt 230.1 lb

## 2015-03-08 DIAGNOSIS — I480 Paroxysmal atrial fibrillation: Secondary | ICD-10-CM

## 2015-03-08 DIAGNOSIS — Z95810 Presence of automatic (implantable) cardiac defibrillator: Secondary | ICD-10-CM

## 2015-03-08 DIAGNOSIS — I10 Essential (primary) hypertension: Secondary | ICD-10-CM

## 2015-03-08 DIAGNOSIS — I5023 Acute on chronic systolic (congestive) heart failure: Secondary | ICD-10-CM | POA: Diagnosis not present

## 2015-03-08 DIAGNOSIS — I4729 Other ventricular tachycardia: Secondary | ICD-10-CM

## 2015-03-08 DIAGNOSIS — I4892 Unspecified atrial flutter: Secondary | ICD-10-CM

## 2015-03-08 DIAGNOSIS — I472 Ventricular tachycardia, unspecified: Secondary | ICD-10-CM

## 2015-03-08 DIAGNOSIS — I2510 Atherosclerotic heart disease of native coronary artery without angina pectoris: Secondary | ICD-10-CM

## 2015-03-08 DIAGNOSIS — I255 Ischemic cardiomyopathy: Secondary | ICD-10-CM | POA: Diagnosis not present

## 2015-03-08 MED ORDER — SPIRONOLACTONE 25 MG PO TABS: 25.0000 mg | ORAL_TABLET | Freq: Every day | ORAL | Status: DC

## 2015-03-08 NOTE — Assessment & Plan Note (Signed)
Continue aspirin and statin. 

## 2015-03-08 NOTE — Assessment & Plan Note (Signed)
Continue ARB and beta blocker. Add spironolactone.

## 2015-03-08 NOTE — Assessment & Plan Note (Signed)
Followed by electrophysiology. 

## 2015-03-08 NOTE — Assessment & Plan Note (Signed)
Continue sotalol

## 2015-03-08 NOTE — Assessment & Plan Note (Signed)
Blood pressure controlled. Continue present medications. 

## 2015-03-08 NOTE — Patient Instructions (Signed)
Medication Instructions:   START SPIRONOLACTONE 25 MG ONCE DAILY  Labwork:  Your physician recommends that you return for lab work in: ONE WEEK  Follow-Up:  Your physician wants you to follow-up in: 6 MONTHS WITH DR CRENSHAW You will receive a reminder letter in the mail two months in advance. If you don't receive a letter, please call our office to schedule the follow-up appointment.   If you need a refill on your cardiac medications before your next appointment, please call your pharmacy.    

## 2015-03-08 NOTE — Assessment & Plan Note (Signed)
Continue statin. 

## 2015-03-08 NOTE — Assessment & Plan Note (Signed)
This is mentioned in Dr. Tanna Furry noteBut I have no documentation of this. I will review with him. If he indeed has paroxysmal atrial fibrillation he would need to be on anticoagulation.

## 2015-03-08 NOTE — Assessment & Plan Note (Signed)
Patient appears to be euvolemic today. Continue present dose of Lasix and Zaroxolyn. Add spironolactone 25 mg daily. Check potassium and renal function in 1 week. Patient will weigh daily and take an additional 40 mg of Lasix for weight gain of 2-3 pounds. We discussed low sodium diet and fluid restriction.

## 2015-03-08 NOTE — Assessment & Plan Note (Signed)
Status post ablation. 

## 2015-03-19 ENCOUNTER — Telehealth: Payer: Self-pay | Admitting: *Deleted

## 2015-03-19 DIAGNOSIS — I4891 Unspecified atrial fibrillation: Secondary | ICD-10-CM

## 2015-03-19 NOTE — Telephone Encounter (Signed)
Pt has afib; DC asa; apixaban 5 mg BID; bmet and cbc 4 weeks     Kirk Ruths        ----- Message -----     From: Evans Lance, MD     Sent: 03/19/2015  2:05 PM      To: Lelon Perla, MD        His ICD suggests he does. He should probably be back on coumadin. I do think he has some bleeding risk but will defer to you. Longest episode of fib is 11 hours. GT    ----- Message -----     From: Lelon Perla, MD     Sent: 03/08/2015  8:42 AM      To: Evans Lance, MD        Marya Amsler,    Does he have afib? Was mentioned in your note but I do not have documentation; if he has, would need to anticogulate.    BC

## 2015-03-19 NOTE — Telephone Encounter (Signed)
Left message for pt to call.

## 2015-03-21 ENCOUNTER — Telehealth: Payer: Self-pay

## 2015-03-21 MED ORDER — APIXABAN 5 MG PO TABS: 5.0000 mg | ORAL_TABLET | Freq: Two times a day (BID) | ORAL | Status: DC

## 2015-03-21 NOTE — Telephone Encounter (Signed)
[name] [name] is returning a call . Please call

## 2015-03-21 NOTE — Telephone Encounter (Signed)
Left voice message to call back

## 2015-03-21 NOTE — Telephone Encounter (Signed)
Instructions given to patient through message from Dr. [name]  STOP aspirn START Apixban 5 mg twice a day Have BMET  And CBC done in 4 weeks (printed and sent to patient's mailing address)  Explained A fib to his satisfaction 2 weeks sample of Eliquis 5 mg to front desk for patient pick up; one pill twice a day  Medication Samples have been provided to the patient.  Drug name: Eliquis  Qty: #28 (two boxes)  LOT: [id number]  Exp.Date: 06/2017  The patient has been instructed regarding the correct time, dose, and frequency of taking this medication, including desired effects and most common side effects.   Obrien Huskins 11:12 AM 03/21/2015

## 2015-03-21 NOTE — Telephone Encounter (Signed)
Lab request sent by mail to patients address

## 2015-04-03 ENCOUNTER — Telehealth: Payer: Self-pay | Admitting: *Deleted

## 2015-04-03 DIAGNOSIS — I4891 Unspecified atrial fibrillation: Secondary | ICD-10-CM | POA: Diagnosis not present

## 2015-04-03 DIAGNOSIS — M79605 Pain in left leg: Principal | ICD-10-CM

## 2015-04-03 DIAGNOSIS — M79604 Pain in right leg: Secondary | ICD-10-CM

## 2015-04-03 DIAGNOSIS — M79671 Pain in right foot: Principal | ICD-10-CM

## 2015-04-03 DIAGNOSIS — M79672 Pain in left foot: Principal | ICD-10-CM

## 2015-04-03 LAB — CBC WITH DIFFERENTIAL/PLATELET
BASOS ABS: 0 10*3/uL (ref 0.0–0.1)
Basophils Relative: 1 % (ref 0–1)
EOS PCT: 3 % (ref 0–5)
Eosinophils Absolute: 0.1 10*3/uL (ref 0.0–0.7)
HEMATOCRIT: 45.2 % (ref 39.0–52.0)
Hemoglobin: 14.4 g/dL (ref 13.0–17.0)
LYMPHS ABS: 1.1 10*3/uL (ref 0.7–4.0)
Lymphocytes Relative: 26 % (ref 12–46)
MCH: 27 pg (ref 26.0–34.0)
MCHC: 31.9 g/dL (ref 30.0–36.0)
MCV: 84.8 fL (ref 78.0–100.0)
MPV: 10.9 fL (ref 8.6–12.4)
Monocytes Absolute: 0.6 10*3/uL (ref 0.1–1.0)
Monocytes Relative: 14 % — ABNORMAL HIGH (ref 3–12)
NEUTROS PCT: 56 % (ref 43–77)
Neutro Abs: 2.5 10*3/uL (ref 1.7–7.7)
Platelets: 198 10*3/uL (ref 150–400)
RBC: 5.33 MIL/uL (ref 4.22–5.81)
RDW: 15.3 % (ref 11.5–15.5)
WBC: 4.4 10*3/uL (ref 4.0–10.5)

## 2015-04-03 NOTE — Telephone Encounter (Signed)
Pt came in to request referral for neurologist. He wants to see Dr. Deliah Boston w/ [facility].  Notes this is for further evaluation of frequent pain in feet and lower legs.  Apparently he has had some trouble getting an arrangement w/ primary care to handle this.

## 2015-04-04 LAB — BASIC METABOLIC PANEL
BUN: 35 mg/dL — AB (ref 7–25)
CALCIUM: 9.7 mg/dL (ref 8.6–10.3)
CHLORIDE: 100 mmol/L (ref 98–110)
CO2: 25 mmol/L (ref 20–31)
CREATININE: 1.56 mg/dL — AB (ref 0.70–1.25)
Glucose, Bld: 302 mg/dL — ABNORMAL HIGH (ref 65–99)
Potassium: 5.4 mmol/L — ABNORMAL HIGH (ref 3.5–5.3)
Sodium: 133 mmol/L — ABNORMAL LOW (ref 135–146)

## 2015-04-04 NOTE — Telephone Encounter (Signed)
Ok for referral [name] [name]  

## 2015-04-04 NOTE — Telephone Encounter (Signed)
Spoke with pt, aware referral placed. Will send to admin for scheduling

## 2015-04-05 ENCOUNTER — Telehealth: Payer: Self-pay | Admitting: Cardiology

## 2015-04-05 DIAGNOSIS — E871 Hypo-osmolality and hyponatremia: Secondary | ICD-10-CM

## 2015-04-05 DIAGNOSIS — Z79899 Other long term (current) drug therapy: Secondary | ICD-10-CM

## 2015-04-05 NOTE — Telephone Encounter (Signed)
Pt informed of lab results (abnormal), Dr. Jacalyn Lefevre recommendations to d/c spironolactone and recheck BMET in 2 weeks. Advised pt to call back if any further questions.

## 2015-04-05 NOTE — Telephone Encounter (Signed)
New message ° ° ° ° °Returning a call to the nurse °

## 2015-04-09 ENCOUNTER — Telehealth: Payer: Self-pay | Admitting: Cardiology

## 2015-04-09 MED ORDER — APIXABAN 5 MG PO TABS: 5.0000 mg | ORAL_TABLET | Freq: Two times a day (BID) | ORAL | Status: DC

## 2015-04-09 NOTE — Telephone Encounter (Signed)
New message       *STAT* If patient is at the pharmacy, call can be transferred to refill team.   1. Which medications need to be refilled? (please list name of each medication and dose if known) eliquis 5mg   2. Which pharmacy/location (including street and city if local pharmacy) is medication to be sent to? [facility]  in [place]  3. Do they need a 30 day or 90 day supply? 30 day supply Pt states that [facility] said they never received the presc that was supposed to have been called in

## 2015-04-09 NOTE — Telephone Encounter (Signed)
Refill for Eliquis sent to [place] and confirmed Called patient and informed

## 2015-04-16 ENCOUNTER — Other Ambulatory Visit: Payer: Self-pay | Admitting: Cardiology

## 2015-04-16 NOTE — Telephone Encounter (Signed)
Rx(s) sent to pharmacy electronically.  

## 2015-05-22 ENCOUNTER — Ambulatory Visit (INDEPENDENT_AMBULATORY_CARE_PROVIDER_SITE_OTHER): Payer: PPO | Admitting: *Deleted

## 2015-05-22 ENCOUNTER — Other Ambulatory Visit: Payer: Self-pay | Admitting: Cardiology

## 2015-05-22 DIAGNOSIS — I5022 Chronic systolic (congestive) heart failure: Secondary | ICD-10-CM

## 2015-05-22 DIAGNOSIS — I255 Ischemic cardiomyopathy: Secondary | ICD-10-CM

## 2015-05-22 NOTE — Progress Notes (Signed)
Remote ICD transmission.   

## 2015-05-22 NOTE — Telephone Encounter (Signed)
Rx request sent to pharmacy.  

## 2015-05-29 ENCOUNTER — Other Ambulatory Visit (INDEPENDENT_AMBULATORY_CARE_PROVIDER_SITE_OTHER): Payer: PPO

## 2015-05-29 ENCOUNTER — Ambulatory Visit (INDEPENDENT_AMBULATORY_CARE_PROVIDER_SITE_OTHER): Payer: PPO | Admitting: Neurology

## 2015-05-29 ENCOUNTER — Encounter: Payer: Self-pay | Admitting: Neurology

## 2015-05-29 VITALS — BP 104/64 | HR 61 | Ht 71.0 in | Wt 229.4 lb

## 2015-05-29 DIAGNOSIS — E0842 Diabetes mellitus due to underlying condition with diabetic polyneuropathy: Secondary | ICD-10-CM | POA: Insufficient documentation

## 2015-05-29 DIAGNOSIS — R278 Other lack of coordination: Secondary | ICD-10-CM

## 2015-05-29 LAB — TSH: TSH: 4.29 u[IU]/mL (ref 0.35–4.50)

## 2015-05-29 LAB — HEMOGLOBIN A1C: Hgb A1c MFr Bld: 11.6 % — ABNORMAL HIGH (ref 4.6–6.5)

## 2015-05-29 LAB — VITAMIN B12: VITAMIN B 12: 751 pg/mL (ref 211–911)

## 2015-05-29 MED ORDER — GABAPENTIN 100 MG PO CAPS: 200.0000 mg | ORAL_CAPSULE | Freq: Two times a day (BID) | ORAL | Status: DC

## 2015-05-29 NOTE — Progress Notes (Signed)
[facility] Clinic Note - Initial Visit   Date: 05/29/2015  [name] [name] MRN: [id number] DOB: 19-Nov-1950   Dear Dr. [name]:  Thank you for your kind referral of [name] [name] for consultation of bilateral leg pain. Although his history is well known to you, please allow [place] to reiterate it for the purpose of our medical record. The patient was accompanied to the clinic by wife who also provides collateral information.     History of Present Illness: [name] [name] is a 65 y.o. right-handed African American male with insulin-dependent diabetes mellitus, atrial flutter, CAD, CHF s/p ICD, hypertension, hyperlipidemia, and CKD presenting for evaluation of bilateral leg pain.    Starting around October 2015 he was admitted with CHF exacerbation and recalls having severe swelling of the legs.  Around the same time, he began noticing tingling pain involving the feet and lower calf which has gradually been worsening over the past year.  It is worse after he has been walking all day and seem less painful in the morning.  He cannot stand anyone touching his feet, because it irritates it.  Symptoms are worse if he is wearing constrictive shoes.  There is no numbness or weakness. He has tried ibuprofen and aspirin which does not help.  He endorses difficulty with balance, but has not fallen and walks independently.    He has been diabetes for 15+ years and insulin-dependent for most of this time.  The last HbA1c in our system is from 2014 where his diabetes was poorly controlled at 11.5, he is unsure if this has been checked with his PCP since this time.    Out-side paper records, electronic medical record, and images have been reviewed where available and summarized as:  Lab Results  Component Value Date   CREATININE 1.56* 04/03/2015   Lab Results  Component Value Date   HGBA1C 11.5* 05/17/2012   Lab Results  Component Value Date   TSH 3.534 05/17/2012     Lab Results  Component Value Date   CHOL 146 09/13/2013   HDL 38.00* 09/13/2013   LDLCALC 94 09/13/2013   LDLDIRECT 197.0 07/10/2011   TRIG 71.0 09/13/2013   CHOLHDL 4 09/13/2013    Past Medical History  Diagnosis Date  . Ischemic cardiomyopathy March 2015    20-25% 2D   . HTN (hypertension)   . Hypercholesteremia     ablation  . Ventricular tachycardia ([place])   . CAD (coronary artery disease) [id number] X 2     RCA-T, 70% PL (off CFX), 99% Prox LAD/90% Dist LAD, S/P TAXUS stent x 2  . ICD (implantable cardiac defibrillator) in place   . CKD (chronic kidney disease)   . Nephrolithiasis   . Atrial flutter ([place]) 04/2012    s/p TEE-EPS+RFCA 04/2012  . Chronic anticoagulation   . Chronic systolic heart failure ([place])   . CHF (congestive heart failure) ([place])   . AICD (automatic cardioverter/defibrillator) present 02/05/2014    Upgrade to Medtronic biventricular ICD, serial number  [id number]   . DM type 2 (diabetes mellitus, type 2) (HCC)     insulin dependent    Past Surgical History  Procedure Laterality Date  . Percutaneous coronary stent intervention (pci-s)  January 2002    PTCA/Stent Distal RCA  . Cardiac defibrillator placement  2007     Medtronic Maximo VR, serial number [id number]  . Percutaneous coronary stent intervention (pci-s)  June 2002    PTCA/Stent x 3 RCA, thrombolysis -  failed  . Percutaneous coronary stent intervention (pci-s)  July 2006    TAXUS stents to prox and distal LAD  . Biv icd genertaor change out  02/05/2014    Upgrade to Medtronic biventricular ICD, serial number  [id number] by Dr. [name]  . Atrial flutter ablation N/A 05/19/2012    Procedure: ATRIAL FLUTTER ABLATION;  Surgeon: Thompson Grayer, MD;  Location: [facility] C[facility] LAB;  Service: Cardiovascular;  Laterality: N/A;  . Bi-ventricular implantable cardioverter defibrillator upgrade N/A 02/05/2014    Procedure: BI-VENTRICULAR IMPLANTABLE CARDIOVERTER DEFIBRILLATOR UPGRADE;  Surgeon: Evans Lance,  MD;  Location: [facility] CATH LAB;  Service: Cardiovascular;  Laterality: N/A;     Medications:  Outpatient Encounter Prescriptions as of 05/29/2015  Medication Sig  . apixaban (ELIQUIS) 5 MG TABS tablet Take 1 tablet (5 mg total) by mouth 2 (two) times daily.  Marland Kitchen atorvastatin (LIPITOR) 80 MG tablet Take 1 tablet (80 mg total) by mouth daily.  . carvedilol (COREG) 3.125 MG tablet TAKE ONE TABLET BY MOUTH TWICE DAILY  . furosemide (LASIX) 80 MG tablet TAKE ONE TABLET BY MOUTH ONCE DAILY  . insulin NPH-insulin regular (NOVOLIN 70/30 RELION) (70-30) 100 UNIT/ML injection Inject 15 Units into the skin 2 (two) times daily with a meal.  . losartan (COZAAR) 25 MG tablet TAKE ONE TABLET BY MOUTH ONCE DAILY  . magnesium oxide (MAG-OX) 400 MG tablet Take 1 tablet (400 mg total) by mouth daily.  . Multiple Vitamins-Minerals (MULTIVITAMIN PO) Take 1 capsule by mouth daily.   . sotalol (BETAPACE) 120 MG tablet TAKE ONE TABLET BY MOUTH TWICE DAILY  . [DISCONTINUED] metolazone (ZAROXOLYN) 2.5 MG tablet Take 1 tablet (2.5 mg total) by mouth 3 (three) times a week.   No facility-administered encounter medications on file as of 05/29/2015.     Allergies:  Allergies  Allergen Reactions  . Penicillins Other (See Comments)    Unknown reaction    Family History: Family History  Problem Relation Age of Onset  . CAD Brother   . CAD Brother   . CAD Brother   . CAD Brother   . Heart failure Father     Deceased  . Alzheimer's disease Mother     Living  . Heart attack Mother   . Healthy Son   . Healthy Daughter   . Diabetes Brother     Social History: Social History  Substance Use Topics  . Smoking status: Former Smoker -- 1.00 packs/day for 29 years    Types: Cigarettes    Quit date: 03/02/2000  . Smokeless tobacco: Never Used  . Alcohol Use: No   Social History   Social History Narrative   Pt lives with wife in a one story home - married for 81 years. He hunts regularly and is active, walking >  1 mile at times without difficulty.  Has 4 children.     Retired Horticulturist, commercial.  Education: some college.    Review of Systems:  CONSTITUTIONAL: No fevers, chills, night sweats, or weight loss.   EYES: No visual changes or eye pain ENT: No hearing changes.  No history of nose bleeds.   RESPIRATORY: No cough, wheezing and shortness of breath.   CARDIOVASCULAR: Negative for chest pain, and palpitations.   GI: Negative for abdominal discomfort, blood in stools or black stools.  No recent change in bowel habits.   GU:  No history of incontinence.   MUSCLOSKELETAL: No history of joint pain or swelling.  No myalgias.   SKIN: Negative  for lesions, rash, and itching.   HEMATOLOGY/ONCOLOGY: Negative for prolonged bleeding, bruising easily, and swollen nodes.  No history of cancer.   ENDOCRINE: Negative for cold or heat intolerance, polydipsia or goiter.   PSYCH:  No depression or anxiety symptoms.   NEURO: As Above.   Vital Signs:  BP 104/64 mmHg  Pulse 61  Ht 5\' 11"  (1.803 m)  Wt 229 lb 6 oz (104.044 kg)  BMI 32.01 kg/m2  SpO2 94%   General Medical Exam:   General:  Well appearing, comfortable.   Eyes/ENT: see cranial nerve examination.   Neck: No masses appreciated.  Full range of motion without tenderness.  No carotid bruits. Respiratory:  Clear to auscultation, good air entry bilaterally.   Cardiac:  Irregularly irregular, no murmur.   Extremities:  No deformities, edema, or skin discoloration.  Skin:  No rashes or lesions.  Neurological Exam: MENTAL STATUS including orientation to time, place, person, recent and remote memory, attention span and concentration, language, and fund of knowledge is normal.  Speech is not dysarthric.  CRANIAL NERVES: II:  No visual field defects.  Unremarkable fundi.   III-IV-VI: Pupils equal round and reactive to light.  Normal conjugate, extra-ocular eye movements in all directions of gaze.  No nystagmus.  No ptosis.   V:  Normal facial sensation.     VII:  Normal facial symmetry and movements.  VIII:  Normal hearing and vestibular function.   IX-X:  Normal palatal movement.   XI:  Normal shoulder shrug and head rotation.   XII:  Normal tongue strength and range of motion, no deviation or fasciculation.  MOTOR:  No atrophy, fasciculations or abnormal movements.  No pronator drift.  Tone is normal.    Right Upper Extremity:    Left Upper Extremity:    Deltoid  5/5   Deltoid  5/5   Biceps  5/5   Biceps  5/5   Triceps  5/5   Triceps  5/5   Wrist extensors  5/5   Wrist extensors  5/5   Wrist flexors  5/5   Wrist flexors  5/5   Finger extensors  5/5   Finger extensors  5/5   Finger flexors  5/5   Finger flexors  5/5   Dorsal interossei  5/5   Dorsal interossei  5/5   Abductor pollicis  5/5   Abductor pollicis  5/5   Tone (Ashworth scale)  0  Tone (Ashworth scale)  0   Right Lower Extremity:    Left Lower Extremity:    Hip flexors  5/5   Hip flexors  5/5   Hip extensors  5/5   Hip extensors  5/5   Knee flexors  5/5   Knee flexors  5/5   Knee extensors  5/5   Knee extensors  5/5   Dorsiflexors  5/5   Dorsiflexors  5/5   Plantarflexors  5/5   Plantarflexors  5/5   Toe extensors  5-/5   Toe extensors  4/5   Toe flexors  4/5   Toe flexors  4/5   Tone (Ashworth scale)  0  Tone (Ashworth scale)  0   MSRs:  Right  Left brachioradialis 2+  brachioradialis 2+  biceps 2+  biceps 2+  triceps 2+  triceps 2+  patellar 2+  patellar 2+  ankle jerk 1+  ankle jerk 1+  Hoffman no  Hoffman no  plantar response down  plantar response down   SENSORY:  Vibration is reduced to 60% at the ankles and 20% at the toes bilaterally.  Proprioception impaired at the great toe.  Hyperesthesia to temperature in the feet.  Pin prick intact.  There is mild sway with Rhomberg testing.  COORDINATION/GAIT: Normal finger-to- nose-finger.  Intact rapid alternating movements bilaterally.  Able to rise  from a chair without using arms.  Gait slightly wide-based and stable. Unsteady with tandem gait.  He cannot perform toe walking.  Heel walking intact.    IMPRESSION: Mr. Preusser is a pleasant 65 year-old gentleman referred for evaluation of bilateral leg paresthesias.  His neurological examination shows a distal predominant large fiber peripheral neuropathy. With his history, diabetic neuropathy is most likely. I had extensive discussion with the patient regarding the pathogenesis, etiology, management, and natural course of neuropathy. Neuropathy tends to be slowly progressive, especially his diabetes is not well-controlled.  To be sure other secondary causes of neuropathy are not missed, I will check a few additional labs.   PLAN/RECOMMENDATIONS:  1.  Check HbA1c, vitamin B12, copper, TSH 2.  Start gabapentin 100mg  at bedtime and titration weekly by 100mg  to 200mg  twice daily.  Patient to call with update in 1 month to determine further titration.  He has mild CKD, so will need to be careful with uptitration 3.  NCS/EMG deferred by patient  4.  Fall precautions discussed and literature provided.   5.  Recommend checking feet daily for any ulcerations or infections, which he is already doing  Return to clinic in 3 months.   The duration of this appointment visit was 45 minutes of face-to-face time with the patient.  Greater than 50% of this time was spent in counseling, explanation of diagnosis, planning of further management, and coordination of care.   Thank you for allowing me to participate in patient's care.  If I can answer any additional questions, I would be pleased to do so.    Sincerely,    Kalynne Womac K. Posey Pronto, DO

## 2015-05-29 NOTE — Patient Instructions (Addendum)
1.  Check blood work 2.  Start Gabapentin 100 mg tablets as follows:    Morning           Evening  Week 1                                  1 tab              Week 2 1 tab                   1 tab              Week 3 1 tab                     2 tab      Week 4  2 tab            2 tab   and continue  Call with update in 1 month, to determine further increase in medication.  If you develop increased sleepiness, stay at the lower dose.           Return to clinic in 3 months  [facility]  Preventing Falls in the Home   Falls are common, often dreaded events in the lives of older people. Aside from the obvious injuries and even death that may result, falls can cause wide-ranging consequences including loss of independence, mental decline, decreased activity, and mobility. Younger people are also at risk of falling, especially those with chronic illnesses and fatigue.  Ways to reduce the risk for falling:  * Examine diet and medications. Warm foods and alcohol dilate blood vessels, which can lead to dizziness when standing. Sleep aids, antidepressants, and pain medications can also increase the likelihood of a fall.  * Get a vison exam. Poor vision, cataracts, and glaucoma increase the chances of falling.  * Check foot gear. Shoes should fit snugly and have a sturdy, nonskid sole and broad, low heel.  * Participate in a physician-approved exercise program to build and maintain muscle strength and improve balance and coordination.  * Increase vitamin D intake. Vitamin D improves muscle strength and increases the amount of calcium the body is able to absorb and deposit in bones.  How to prevent falls from common hazards:  * Floors - Remove all loose wires, cords, and throw rugs. Minimize clutter. Make sure rugs are anchored and smooth. Keep furniture in its usual place.  * Chairs - Use chairs with straight backs, armrests, and firm seats. Add firm cushions to existing pieces to add height.   * Bathroom - Install grab bars and non-skid tape in the tub or shower. Use a bathtub transfer bench or a shower chair with a back support. Use an elevated toilet seat and/or safety rails to assist standing from a low surface. Do not use towel racks or bathroom tissue holders to help you stand.  * Lighting - Make sure halls, stairways, and entrances are well-lit. Install a night light in your bathroom or hallway. Make sure there is a light switch at the top and bottom of the staircase. Turn lights on if you get up in the middle of the night. Make sure lamps or light switches are within reach of the bed if you have to get up during the night.  * Kitchen - Install non-skid rubber mats near the sink and stove. Clean spills immediately. Store frequently used  utensils, pots, and pans between waist and eye level. This helps prevent reaching and bending. Sit when getting things out of the lower cupboards.  * Living room / [place] furniture with wide spaces in between, giving enough room to move around. Establish a route through the living room that gives you something to hold onto as you walk.  * Stairs - Make sure treads, rails, and rugs are secure. Install a rail on both sides of the stairs. If stairs are a threat, it might be helpful to arrange most of your activities on the lower level to reduce the number of times you must climb the stairs.  * Entrances and doorways - Install metal handles on the walls adjacent to the doorknobs of all doors to make it more secure as you travel through the doorway.  Tips for maintaining balance:  * Keep at least one hand free at all times Try using a backpack or fanny pack to hold things rather than carrying them in your hands. Never carry objects in both hands when walking as this interferes with keeping your balance.  * Attempt to swing both arms from front to back while walking. This might require a conscious effort if Parkinson's disease has diminished your  movement. It will, however, help you to maintain balance and posture, and reduce fatigue.  * Consciously lift your feet off the ground when walking. Shuffling and dragging of the feet is a common culprit in losing your balance.  * When trying to navigate turns, use a "U" technique of facing forward and making a wide turn, rather than pivoting sharply.  * Try to stand with your feet shoulder-length apart. When your feet are close together for any length of time, you increase your risk of losing your balance and falling.  * Do one thing at a time. Do not try to walk and accomplish another task, such as reading or looking around. The decrease in your automatic reflexes complicates motor function, so the less distraction, the better.  * Do not wear rubber or gripping soled shoes, they might "catch" on the floor and cause tripping.  * Move slowly when changing positions. Use deliberate, concentrated movements and, if needed, use a grab bar or walking aid. Count fifteen (15) seconds after standing to begin walking.  * If balance is a continuous problem, you might want to consider a walking aid such as a cane, walking stick, or walker. Once you have mastered walking with help, you may be ready to try it again on your own.  This information is provided by [facility] and is not intended to replace the medical advice of your physician or other health care providers. Please consult your physician or other health care providers for advice regarding your specific medical condition.

## 2015-06-01 LAB — COPPER, SERUM: Copper: 134 ug/dL (ref 72–166)

## 2015-06-20 LAB — CUP PACEART REMOTE DEVICE CHECK
Battery Voltage: 3 V
Brady Statistic AP VP Percent: 0.19 %
Brady Statistic RA Percent Paced: 1.98 %
Date Time Interrogation Session: 20170322082405
HIGH POWER IMPEDANCE MEASURED VALUE: 44 Ohm
HIGH POWER IMPEDANCE MEASURED VALUE: 56 Ohm
Implantable Lead Implant Date: 20151207
Implantable Lead Location: 753859
Implantable Lead Model: 4598
Implantable Lead Model: 5076
Lead Channel Impedance Value: 304 Ohm
Lead Channel Impedance Value: 380 Ohm
Lead Channel Impedance Value: 532 Ohm
Lead Channel Impedance Value: 589 Ohm
Lead Channel Impedance Value: 646 Ohm
Lead Channel Impedance Value: 855 Ohm
Lead Channel Impedance Value: 912 Ohm
Lead Channel Impedance Value: 950 Ohm
Lead Channel Pacing Threshold Amplitude: 0.5 V
Lead Channel Pacing Threshold Amplitude: 0.625 V
Lead Channel Pacing Threshold Pulse Width: 0.4 ms
Lead Channel Pacing Threshold Pulse Width: 0.4 ms
Lead Channel Sensing Intrinsic Amplitude: 6.5 mV
Lead Channel Sensing Intrinsic Amplitude: 6.5 mV
Lead Channel Setting Pacing Amplitude: 0.75 V
Lead Channel Setting Pacing Amplitude: 1.5 V
Lead Channel Setting Pacing Amplitude: 2.25 V
Lead Channel Setting Pacing Pulse Width: 0.4 ms
Lead Channel Setting Pacing Pulse Width: 0.4 ms
Lead Channel Setting Sensing Sensitivity: 0.3 mV
MDC IDC LEAD IMPLANT DT: 20071219
MDC IDC LEAD IMPLANT DT: 20151207
MDC IDC LEAD LOCATION: 753858
MDC IDC LEAD LOCATION: 753860
MDC IDC MSMT BATTERY REMAINING LONGEVITY: 99 mo
MDC IDC MSMT LEADCHNL LV IMPEDANCE VALUE: 323 Ohm
MDC IDC MSMT LEADCHNL LV IMPEDANCE VALUE: 380 Ohm
MDC IDC MSMT LEADCHNL LV IMPEDANCE VALUE: 437 Ohm
MDC IDC MSMT LEADCHNL LV IMPEDANCE VALUE: 627 Ohm
MDC IDC MSMT LEADCHNL RA IMPEDANCE VALUE: 380 Ohm
MDC IDC MSMT LEADCHNL RA SENSING INTR AMPL: 2.75 mV
MDC IDC MSMT LEADCHNL RA SENSING INTR AMPL: 2.75 mV
MDC IDC MSMT LEADCHNL RV PACING THRESHOLD AMPLITUDE: 1 V
MDC IDC MSMT LEADCHNL RV PACING THRESHOLD PULSEWIDTH: 0.4 ms
MDC IDC STAT BRADY AP VS PERCENT: 1.8 %
MDC IDC STAT BRADY AS VP PERCENT: 94.53 %
MDC IDC STAT BRADY AS VS PERCENT: 3.49 %
MDC IDC STAT BRADY RV PERCENT PACED: 94.17 %

## 2015-06-21 ENCOUNTER — Encounter: Payer: Self-pay | Admitting: Cardiology

## 2015-06-29 ENCOUNTER — Other Ambulatory Visit: Payer: Self-pay | Admitting: Cardiology

## 2015-07-05 ENCOUNTER — Encounter: Payer: Self-pay | Admitting: Cardiology

## 2015-07-29 ENCOUNTER — Other Ambulatory Visit: Payer: Self-pay | Admitting: Cardiology

## 2015-07-30 NOTE — Telephone Encounter (Signed)
Rx(s) sent to pharmacy electronically.  

## 2015-08-21 ENCOUNTER — Ambulatory Visit (INDEPENDENT_AMBULATORY_CARE_PROVIDER_SITE_OTHER): Payer: PPO | Admitting: *Deleted

## 2015-08-21 DIAGNOSIS — I5022 Chronic systolic (congestive) heart failure: Secondary | ICD-10-CM

## 2015-08-21 DIAGNOSIS — I255 Ischemic cardiomyopathy: Secondary | ICD-10-CM

## 2015-08-21 NOTE — Progress Notes (Signed)
Remote ICD transmission.   

## 2015-08-23 ENCOUNTER — Encounter: Payer: Self-pay | Admitting: Cardiology

## 2015-08-24 LAB — CUP PACEART REMOTE DEVICE CHECK
Battery Remaining Longevity: 96 mo
Brady Statistic AP VP Percent: 0.86 %
Brady Statistic AP VS Percent: 5.23 %
Brady Statistic AS VP Percent: 92.51 %
Brady Statistic AS VS Percent: 1.4 %
HighPow Impedance: 46 Ohm
HighPow Impedance: 58 Ohm
Implantable Lead Implant Date: 20151207
Implantable Lead Location: 753858
Implantable Lead Location: 753860
Implantable Lead Model: 5076
Implantable Lead Model: 6947
Lead Channel Impedance Value: 304 Ohm
Lead Channel Impedance Value: 304 Ohm
Lead Channel Impedance Value: 380 Ohm
Lead Channel Impedance Value: 380 Ohm
Lead Channel Impedance Value: 513 Ohm
Lead Channel Impedance Value: 779 Ohm
Lead Channel Impedance Value: 893 Ohm
Lead Channel Impedance Value: 912 Ohm
Lead Channel Pacing Threshold Amplitude: 0.5 V
Lead Channel Pacing Threshold Amplitude: 0.625 V
Lead Channel Pacing Threshold Pulse Width: 0.4 ms
Lead Channel Sensing Intrinsic Amplitude: 4 mV
Lead Channel Setting Pacing Amplitude: 0.75 V
Lead Channel Setting Pacing Amplitude: 1.5 V
Lead Channel Setting Pacing Pulse Width: 0.4 ms
Lead Channel Setting Sensing Sensitivity: 0.3 mV
MDC IDC LEAD IMPLANT DT: 20071219
MDC IDC LEAD IMPLANT DT: 20151207
MDC IDC LEAD LOCATION: 753859
MDC IDC LEAD MODEL: 4598
MDC IDC MSMT BATTERY VOLTAGE: 3 V
MDC IDC MSMT LEADCHNL LV IMPEDANCE VALUE: 399 Ohm
MDC IDC MSMT LEADCHNL LV IMPEDANCE VALUE: 570 Ohm
MDC IDC MSMT LEADCHNL LV IMPEDANCE VALUE: 589 Ohm
MDC IDC MSMT LEADCHNL LV IMPEDANCE VALUE: 589 Ohm
MDC IDC MSMT LEADCHNL LV PACING THRESHOLD PULSEWIDTH: 0.4 ms
MDC IDC MSMT LEADCHNL RA IMPEDANCE VALUE: 342 Ohm
MDC IDC MSMT LEADCHNL RA SENSING INTR AMPL: 4 mV
MDC IDC MSMT LEADCHNL RV PACING THRESHOLD AMPLITUDE: 1 V
MDC IDC MSMT LEADCHNL RV PACING THRESHOLD PULSEWIDTH: 0.4 ms
MDC IDC MSMT LEADCHNL RV SENSING INTR AMPL: 8.25 mV
MDC IDC MSMT LEADCHNL RV SENSING INTR AMPL: 8.25 mV
MDC IDC SESS DTM: 20170621092607
MDC IDC SET LEADCHNL LV PACING AMPLITUDE: 2.25 V
MDC IDC SET LEADCHNL LV PACING PULSEWIDTH: 0.4 ms
MDC IDC STAT BRADY RA PERCENT PACED: 6.09 %
MDC IDC STAT BRADY RV PERCENT PACED: 92.54 %

## 2015-08-29 ENCOUNTER — Encounter: Payer: Self-pay | Admitting: Neurology

## 2015-08-29 ENCOUNTER — Ambulatory Visit (INDEPENDENT_AMBULATORY_CARE_PROVIDER_SITE_OTHER): Payer: PPO | Admitting: Neurology

## 2015-08-29 VITALS — BP 120/70 | HR 65 | Ht 71.0 in | Wt 232.1 lb

## 2015-08-29 DIAGNOSIS — M792 Neuralgia and neuritis, unspecified: Secondary | ICD-10-CM

## 2015-08-29 DIAGNOSIS — E0842 Diabetes mellitus due to underlying condition with diabetic polyneuropathy: Secondary | ICD-10-CM

## 2015-08-29 MED ORDER — GABAPENTIN 300 MG PO CAPS: 300.0000 mg | ORAL_CAPSULE | Freq: Two times a day (BID) | ORAL | Status: DC

## 2015-08-29 NOTE — Patient Instructions (Addendum)
1.  Increase gabapentin to 300mg  twice daily 2.  You can try AsperCream over the counter ointment to your feet 3.  Call me with an update in 2-4 weeks to see if we need to increase your medication more  Return to clinic 3 months

## 2015-08-29 NOTE — Progress Notes (Signed)
Note routed

## 2015-08-29 NOTE — Progress Notes (Signed)
Follow-up Visit   Date: 08/29/2015    [name] [name] MRN: [id number] DOB: 02-Jul-1950   Interim History: [name] [name] is a 65 y.o. right-handed African American male with insulin-dependent diabetes mellitus, atrial flutter, CAD, CHF s/p ICD, hypertension, hyperlipidemia, and CKD returning to the clinic for follow-up of diabetic neuropathy.  The patient was accompanied to the clinic by self.  History of present illness: Starting around October 2015 he was admitted with CHF exacerbation and recalls having severe swelling of the legs. Around the same time, he began noticing tingling pain involving the feet and lower calf which has gradually been worsening over the past year. It is worse after he has been walking all day and seem less painful in the morning. He cannot stand anyone touching his feet, because it irritates it. Symptoms are worse if he is wearing constrictive shoes. There is no numbness or weakness. He has tried ibuprofen and aspirin which does not help. He endorses difficulty with balance, but has not fallen and walks independently.   He has been diabetes for 15+ years and insulin-dependent for most of this time. The last HbA1c in our system is from 2014 where his diabetes was poorly controlled at 11.5, he is unsure if this has been checked with his PCP since this time.   UPDATE 08/29/2015:  Since taking gabapentin 200mg  twice daily, he has noticed mild improvement with respect to tingling by about 20%.  He has occasional sharp stabbing pain which very brief and transient.  Balance and weakness is stable.  No new complaints.  Medications:  Current Outpatient Prescriptions on File Prior to Visit  Medication Sig Dispense Refill  . apixaban (ELIQUIS) 5 MG TABS tablet Take 1 tablet (5 mg total) by mouth 2 (two) times daily. 60 tablet 6  . atorvastatin (LIPITOR) 80 MG tablet Take 1 tablet (80 mg total) by mouth daily. 90 tablet 3  . atorvastatin (LIPITOR) 80 MG  tablet TAKE ONE TABLET BY MOUTH ONCE DAILY 90 tablet 0  . carvedilol (COREG) 3.125 MG tablet TAKE ONE TABLET BY MOUTH TWICE DAILY 60 tablet 3  . furosemide (LASIX) 80 MG tablet TAKE ONE TABLET BY MOUTH ONCE DAILY 90 tablet 0  . insulin NPH-insulin regular (NOVOLIN 70/30 RELION) (70-30) 100 UNIT/ML injection Inject 15 Units into the skin 2 (two) times daily with a meal. 10 mL 12  . losartan (COZAAR) 25 MG tablet TAKE ONE TABLET BY MOUTH ONCE DAILY 30 tablet 3  . magnesium oxide (MAG-OX) 400 MG tablet Take 1 tablet (400 mg total) by mouth daily. 90 tablet 3  . Multiple Vitamins-Minerals (MULTIVITAMIN PO) Take 1 capsule by mouth daily.     . sotalol (BETAPACE) 120 MG tablet TAKE ONE TABLET BY MOUTH TWICE DAILY 180 tablet 3   No current facility-administered medications on file prior to visit.    Allergies:  Allergies  Allergen Reactions  . Penicillins Other (See Comments)    Unknown reaction    Review of Systems:  CONSTITUTIONAL: No fevers, chills, night sweats, or weight loss.  EYES: No visual changes or eye pain ENT: No hearing changes.  No history of nose bleeds.   RESPIRATORY: No cough, wheezing and shortness of breath.   CARDIOVASCULAR: Negative for chest pain, and palpitations.   GI: Negative for abdominal discomfort, blood in stools or black stools.  No recent change in bowel habits.   GU:  No history of incontinence.   MUSCLOSKELETAL: No history of joint pain or swelling.  No myalgias.   SKIN: Negative for lesions, rash, and itching.   ENDOCRINE: Negative for cold or heat intolerance, polydipsia or goiter.   PSYCH:  No depression or anxiety symptoms.   NEURO: As Above.   Vital Signs:  BP 120/70 mmHg  Pulse 65  Ht 5\' 11"  (1.803 m)  Wt 232 lb 2 oz (105.291 kg)  BMI 32.39 kg/m2  SpO2 92%  Neurological Exam: MENTAL STATUS including orientation to time, place, person, recent and remote memory, attention span and concentration, language, and fund of knowledge is normal.   Speech is not dysarthric.  CRANIAL NERVES: Pupils equal round and reactive to light.  Normal conjugate, extra-ocular eye movements in all directions of gaze.  No ptosis.  Face is symmetric. Palate elevates symmetrically.  Tongue is midline.  MOTOR:  Motor strength is 5/5 in all extremities, except toe flexion and extension 4/5.  No atrophy, fasciculations or abnormal movements.  No pronator drift.  Tone is normal.    MSRs:  Reflexes are 2+/4 throughout, except 1+/4 patella.  SENSORY: Vibration is reduced to 60% at the ankles and 20% at the toes bilaterally. Hyperesthesia to temperature in the feet. Pin prick intact. There is mild sway with Rhomberg testing.   COORDINATION/GAIT:  Gait slightly wide-based and stable. Unsteady with tandem gait.   Data: Labs 05/29/2015:  Copper 134, TSH 4.29, HbA1c 11.6*, vitamin B12 751  IMPRESSION/PLAN: 1.  Diabetic distal and symmetric polyneuropathy, slightly improved with gabapentin so will titrate further.  - Encouraged tight control of blood sugars as his HbA1c remains elevated  - Increase gabapentin to 300mg  twice daily, can increase further as needed, keeping in mind his CKD  - Try Aspercream to the feet  - Fall precautions discussed, esepcially because he is on Eliquis but balance overall seems okay, mostly bothersome on uneven surfaces  2.  Insulin-dependent diabetes mellitus  3.  Chronic kidney disease  4. Atrial flutter on Eliquis  Return to clinic in 3 months  The duration of this appointment visit was 25 minutes of face-to-face time with the patient.  Greater than 50% of this time was spent in counseling, explanation of diagnosis, planning of further management, and coordination of care.   Thank you for allowing me to participate in patient's care.  If I can answer any additional questions, I would be pleased to do so.    Sincerely,    Kaidyn Hernandes K. Posey Pronto, DO

## 2015-08-30 ENCOUNTER — Telehealth: Payer: Self-pay

## 2015-08-30 NOTE — Telephone Encounter (Signed)
Referred to ICM clinic by Debroah Loop, Device RN.  Call to patient and provided ICM intro and he agreed to monthly calls.  Provided ICM number and encouraged to call for fluids.  1st ICM remote transmission scheduled for 10/10/2015 which is day before office visit with Dr [name].

## 2015-09-06 ENCOUNTER — Encounter: Payer: Self-pay | Admitting: Cardiology

## 2015-09-23 ENCOUNTER — Encounter: Payer: Self-pay | Admitting: Cardiology

## 2015-10-01 NOTE — Progress Notes (Signed)
HPI: FU CAD (s/p prior PCI of RCA and LAD), ICM, systolic CHF, VTach s/p ICD Rx 04/2010 (Sotalol added to regimen), [name], HTN, HL, CKD. LHC 7/06: EF 35-40%, mLAD 99%, dLAD 90%, mid to dist CFX 70%, RCA occluded. PCI: DES to mid and dist LAD. Carotids 4/12: 0-39% bilat ICA. Previous atrial flutter ablation. Nuclear study October 2015 showed an ejection fraction of 30%. There was scar but no ischemia. Has had problems with acute on chronic systolic congestive heart failure. Has had upgrade of his ICD to a biventricular device. Echo 12/16 showed EF 20-25, restrictive fillling, RAE/RVE, LAE, mild TR. Pt noted to have PAF on his device and started on apixaban. Since he was last seen, He denies dyspnea, chest pain, palpitations or syncope. Occasional mild pedal edema.  Current Outpatient Prescriptions  Medication Sig Dispense Refill  . apixaban (ELIQUIS) 5 MG TABS tablet Take 1 tablet (5 mg total) by mouth 2 (two) times daily. 60 tablet 6  . atorvastatin (LIPITOR) 80 MG tablet Take 1 tablet (80 mg total) by mouth daily. 90 tablet 3  . atorvastatin (LIPITOR) 80 MG tablet TAKE ONE TABLET BY MOUTH ONCE DAILY 90 tablet 0  . carvedilol (COREG) 3.125 MG tablet TAKE ONE TABLET BY MOUTH TWICE DAILY 60 tablet 3  . furosemide (LASIX) 80 MG tablet TAKE ONE TABLET BY MOUTH ONCE DAILY 90 tablet 0  . gabapentin (NEURONTIN) 300 MG capsule Take 1 capsule (300 mg total) by mouth 2 (two) times daily. 60 capsule 5  . insulin NPH-insulin regular (NOVOLIN 70/30 RELION) (70-30) 100 UNIT/ML injection Inject 15 Units into the skin 2 (two) times daily with a meal. 10 mL 12  . losartan (COZAAR) 25 MG tablet TAKE ONE TABLET BY MOUTH ONCE DAILY 30 tablet 3  . magnesium oxide (MAG-OX) 400 MG tablet Take 1 tablet (400 mg total) by mouth daily. 90 tablet 3  . Multiple Vitamins-Minerals (MULTIVITAMIN PO) Take 1 capsule by mouth daily.     . sotalol (BETAPACE) 120 MG tablet TAKE ONE TABLET BY MOUTH TWICE DAILY 180 tablet 3   No  current facility-administered medications for this visit.      Past Medical History:  Diagnosis Date  . AICD (automatic cardioverter/defibrillator) present 02/05/2014   Upgrade to Medtronic biventricular ICD, serial number  [id number]   . Atrial flutter ([place]) 04/2012   s/p TEE-EPS+RFCA 04/2012  . CAD (coronary artery disease) [id number] X 2    RCA-T, 70% PL (off CFX), 99% Prox LAD/90% Dist LAD, S/P TAXUS stent x 2  . CHF (congestive heart failure) ([place])   . Chronic anticoagulation   . Chronic systolic heart failure ([place])   . CKD (chronic kidney disease)   . DM type 2 (diabetes mellitus, type 2) ([facility])    insulin dependent  . HTN (hypertension)   . Hypercholesteremia    ablation  . ICD (implantable cardiac defibrillator) in place   . Ischemic cardiomyopathy March 2015   20-25% 2D   . Nephrolithiasis   . Ventricular tachycardia [facility])     Past Surgical History:  Procedure Laterality Date  . ATRIAL FLUTTER ABLATION N/A 05/19/2012   Procedure: ATRIAL FLUTTER ABLATION;  Surgeon: Thompson Grayer, MD;  Location: [facility] CATH LAB;  Service: Cardiovascular;  Laterality: N/A;  . BI-VENTRICULAR IMPLANTABLE CARDIOVERTER DEFIBRILLATOR UPGRADE N/A 02/05/2014   Procedure: BI-VENTRICULAR IMPLANTABLE CARDIOVERTER DEFIBRILLATOR UPGRADE;  Surgeon: Evans Lance, MD;  Location: [facility] CATH LAB;  Service: Cardiovascular;  Laterality: N/A;  . BIV ICD  GENERTAOR CHANGE OUT  02/05/2014   Upgrade to Medtronic biventricular ICD, serial number  [id number] by Dr. [name]  . CARDIAC DEFIBRILLATOR PLACEMENT  2007    Medtronic Maximo VR, serial number [id number]  . PERCUTANEOUS CORONARY STENT INTERVENTION (PCI-S)  January 2002   PTCA/Stent Distal RCA  . PERCUTANEOUS CORONARY STENT INTERVENTION (PCI-S)  June 2002   PTCA/Stent x 3 RCA, thrombolysis - failed  . PERCUTANEOUS CORONARY STENT INTERVENTION (PCI-S)  July 2006   TAXUS stents to prox and distal LAD    Social History   Social History  . Marital status: Married     Spouse name: N/A  . Number of children: 3  . Years of education: N/A   Occupational History  . brick layer - currently unemployed    Social History Main Topics  . Smoking status: Former Smoker    Packs/day: 1.00    Years: 29.00    Types: Cigarettes    Quit date: 03/02/2000  . Smokeless tobacco: Never Used  . Alcohol use No  . Drug use: No  . Sexual activity: Not on file   Other Topics Concern  . Not on file   Social History Narrative   Pt lives with wife in a one story home - married for 10 years. He hunts regularly and is active, walking > 1 mile at times without difficulty.  Has 4 children.     Retired Horticulturist, commercial.  Education: some college.    Family History  Problem Relation Age of Onset  . Heart failure Father     Deceased  . Alzheimer's disease Mother     Living  . Heart attack Mother   . CAD Brother   . CAD Brother   . CAD Brother   . CAD Brother   . Healthy Son   . Healthy Daughter   . Diabetes Brother     ROS: no fevers or chills, productive cough, hemoptysis, dysphasia, odynophagia, melena, hematochezia, dysuria, hematuria, rash, seizure activity, orthopnea, PND, pedal edema, claudication. Remaining systems are negative.  Physical Exam: Well-developed well-nourished in no acute distress.  Skin is warm and dry.  HEENT is normal.  Neck is supple.  Chest is clear to auscultation with normal expansion.  Cardiovascular exam is regular rate and rhythm.  Abdominal exam nontender or distended. No masses palpated. Extremities show no edema. neuro grossly intact  ECG -Sinus rhythm with ventricular pacing.  A/P  1 Paroxysmal atrial fibrillation-patient in sinus rhythm today. Continue apixaban. Check hemoglobin and renal function. Continue beta blocker.  2 coronary artery disease-continue statin. No aspirin given need for anticoagulation.  3 ischemic cardiomyopathy-continue ARB. I do not think his blood pressure will tolerate entresto. Increase carvedilol  to 6.25 mg twice a day. Spironolactone caused worsening renal function previously.  4 chronic systolic congestive heart failure-he appears to be euvolemic. Continue present dose of Lasix. Take an additional 40 mg daily for weight gain of 2 pounds. Check potassium and renal function.  5 status post biventricular ICD-followed by electrophysiology.  6 hyperlipidemia-continue statin. Check lipids and liver.  7 ventricular tachycardia-continue sotalol.  8 hypertension-blood pressure controlled. Continue present medications.  Kirk Ruths, MD

## 2015-10-10 ENCOUNTER — Telehealth: Payer: Self-pay

## 2015-10-10 ENCOUNTER — Ambulatory Visit (INDEPENDENT_AMBULATORY_CARE_PROVIDER_SITE_OTHER): Payer: PPO

## 2015-10-10 DIAGNOSIS — I5022 Chronic systolic (congestive) heart failure: Secondary | ICD-10-CM | POA: Diagnosis not present

## 2015-10-10 DIAGNOSIS — Z95810 Presence of automatic (implantable) cardiac defibrillator: Secondary | ICD-10-CM | POA: Diagnosis not present

## 2015-10-10 NOTE — Progress Notes (Signed)
EPIC Encounter for ICM Monitoring  Patient Name: [name] [name] is a 65 y.o. male Date: 10/10/2015 Primary Care Physican: Leonard Downing, MD Primary Cardiologist: [name] Electrophysiologist: [name] Dry Weight:  unknown Bi-V Pacing:  90.9%       Attempted patient call and unable to reach.  Transmission reviewed.   Thoracic impedance abnormal suggesting fluid accumulation 09/26/2015 to 10/03/2015 and returned to normal 10/03/2015.   ICM trend: 10/10/2015     Follow-up plan: ICM clinic phone appointment on 11/20/2015.  Copy of ICM check sent to device physician.   [name], RN 10/10/2015 12:31 PM

## 2015-10-10 NOTE — Telephone Encounter (Signed)
Remote ICM transmission received.  Attempted patient call and left detailed message for return call.   

## 2015-10-11 ENCOUNTER — Ambulatory Visit (INDEPENDENT_AMBULATORY_CARE_PROVIDER_SITE_OTHER): Payer: PPO | Admitting: Cardiology

## 2015-10-11 ENCOUNTER — Encounter: Payer: Self-pay | Admitting: Cardiology

## 2015-10-11 ENCOUNTER — Other Ambulatory Visit: Payer: Self-pay | Admitting: Cardiology

## 2015-10-11 VITALS — BP 100/72 | HR 59

## 2015-10-11 DIAGNOSIS — N259 Disorder resulting from impaired renal tubular function, unspecified: Secondary | ICD-10-CM | POA: Diagnosis not present

## 2015-10-11 DIAGNOSIS — Z95810 Presence of automatic (implantable) cardiac defibrillator: Secondary | ICD-10-CM

## 2015-10-11 DIAGNOSIS — I480 Paroxysmal atrial fibrillation: Secondary | ICD-10-CM

## 2015-10-11 DIAGNOSIS — I255 Ischemic cardiomyopathy: Secondary | ICD-10-CM

## 2015-10-11 DIAGNOSIS — E7800 Pure hypercholesterolemia, unspecified: Secondary | ICD-10-CM | POA: Diagnosis not present

## 2015-10-11 DIAGNOSIS — I10 Essential (primary) hypertension: Secondary | ICD-10-CM

## 2015-10-11 DIAGNOSIS — E785 Hyperlipidemia, unspecified: Secondary | ICD-10-CM

## 2015-10-11 DIAGNOSIS — I2510 Atherosclerotic heart disease of native coronary artery without angina pectoris: Secondary | ICD-10-CM

## 2015-10-11 LAB — CBC WITH DIFFERENTIAL/PLATELET
BASOS PCT: 1 %
Basophils Absolute: 38 cells/uL (ref 0–200)
Eosinophils Absolute: 152 cells/uL (ref 15–500)
Eosinophils Relative: 4 %
HCT: 47.1 % (ref 38.5–50.0)
Hemoglobin: 14.9 g/dL (ref 13.2–17.1)
LYMPHS PCT: 32 %
Lymphs Abs: 1216 cells/uL (ref 850–3900)
MCH: 27.9 pg (ref 27.0–33.0)
MCHC: 31.6 g/dL — ABNORMAL LOW (ref 32.0–36.0)
MCV: 88 fL (ref 80.0–100.0)
MONOS PCT: 14 %
MPV: 12.6 fL — AB (ref 7.5–12.5)
Monocytes Absolute: 532 cells/uL (ref 200–950)
NEUTROS ABS: 1862 {cells}/uL (ref 1500–7800)
Neutrophils Relative %: 49 %
PLATELETS: 167 10*3/uL (ref 140–400)
RBC: 5.35 MIL/uL (ref 4.20–5.80)
RDW: 14.6 % (ref 11.0–15.0)
WBC: 3.8 10*3/uL (ref 3.8–10.8)

## 2015-10-11 MED ORDER — CARVEDILOL 6.25 MG PO TABS: 6.2500 mg | ORAL_TABLET | Freq: Two times a day (BID) | ORAL | 9 refills | Status: DC

## 2015-10-11 NOTE — Patient Instructions (Signed)
Medication Instructions:  Your physician has recommended you make the following change in your medication:  1. Increase Coreg (6.25 mg) twice a day.  Sent in today to patient's requested pharmacy.   Labwork: Your physician recommends that you have lab work today: lipid/lft/bmet/ cbc   Testing/Procedures: -None  Follow-Up: Your physician wants you to follow-up in: 6 months with Dr. [name].  You will receive a reminder letter in the mail two months in advance. If you don't receive a letter, please call our office to schedule the follow-up appointment.   Any Other Special Instructions Will Be Listed Below (If Applicable).     If you need a refill on your cardiac medications before your next appointment, please call your pharmacy.

## 2015-10-12 LAB — BASIC METABOLIC PANEL
BUN: 22 mg/dL (ref 7–25)
CHLORIDE: 100 mmol/L (ref 98–110)
CO2: 26 mmol/L (ref 20–31)
Calcium: 9.8 mg/dL (ref 8.6–10.3)
Creat: 1.23 mg/dL (ref 0.70–1.25)
Glucose, Bld: 457 mg/dL — ABNORMAL HIGH (ref 65–99)
POTASSIUM: 5.2 mmol/L (ref 3.5–5.3)
SODIUM: 136 mmol/L (ref 135–146)

## 2015-10-12 LAB — HEPATIC FUNCTION PANEL
ALBUMIN: 3.7 g/dL (ref 3.6–5.1)
ALK PHOS: 120 U/L — AB (ref 40–115)
ALT: 18 U/L (ref 9–46)
AST: 19 U/L (ref 10–35)
Bilirubin, Direct: 0.1 mg/dL (ref <=0.2)
Indirect Bilirubin: 0.7 mg/dL (ref 0.2–1.2)
TOTAL PROTEIN: 7.5 g/dL (ref 6.1–8.1)
Total Bilirubin: 0.8 mg/dL (ref 0.2–1.2)

## 2015-10-12 LAB — LIPID PANEL
CHOL/HDL RATIO: 4.6 ratio (ref <=5.0)
Cholesterol: 165 mg/dL (ref 125–200)
HDL: 36 mg/dL — AB (ref >=40)
LDL CALC: 110 mg/dL (ref <130)
TRIGLYCERIDES: 96 mg/dL (ref <150)
VLDL: 19 mg/dL (ref <30)

## 2015-10-14 ENCOUNTER — Encounter: Payer: Self-pay | Admitting: *Deleted

## 2015-11-20 ENCOUNTER — Telehealth: Payer: Self-pay | Admitting: Cardiology

## 2015-11-20 ENCOUNTER — Encounter: Payer: PPO | Admitting: *Deleted

## 2015-11-20 NOTE — Telephone Encounter (Signed)
LMOVM reminding pt to send remote transmission.   

## 2015-11-22 ENCOUNTER — Encounter: Payer: Self-pay | Admitting: Cardiology

## 2015-11-22 NOTE — Progress Notes (Signed)
No ICM remote transmission received for 11/20/2015 and next ICM transmission scheduled for 12/23/2015.

## 2015-11-25 ENCOUNTER — Other Ambulatory Visit: Payer: Self-pay | Admitting: Family

## 2015-11-25 ENCOUNTER — Ambulatory Visit (INDEPENDENT_AMBULATORY_CARE_PROVIDER_SITE_OTHER): Payer: PPO | Admitting: Family

## 2015-11-25 ENCOUNTER — Encounter: Payer: Self-pay | Admitting: Family

## 2015-11-25 ENCOUNTER — Other Ambulatory Visit (INDEPENDENT_AMBULATORY_CARE_PROVIDER_SITE_OTHER): Payer: PPO

## 2015-11-25 VITALS — BP 98/64 | HR 55 | Temp 97.9°F | Resp 16 | Ht 71.0 in | Wt 231.0 lb

## 2015-11-25 DIAGNOSIS — E0842 Diabetes mellitus due to underlying condition with diabetic polyneuropathy: Secondary | ICD-10-CM

## 2015-11-25 DIAGNOSIS — Z23 Encounter for immunization: Secondary | ICD-10-CM

## 2015-11-25 DIAGNOSIS — I480 Paroxysmal atrial fibrillation: Secondary | ICD-10-CM

## 2015-11-25 DIAGNOSIS — N183 Chronic kidney disease, stage 3 unspecified: Secondary | ICD-10-CM

## 2015-11-25 DIAGNOSIS — I10 Essential (primary) hypertension: Secondary | ICD-10-CM

## 2015-11-25 DIAGNOSIS — I509 Heart failure, unspecified: Secondary | ICD-10-CM

## 2015-11-25 LAB — COMPREHENSIVE METABOLIC PANEL
ALK PHOS: 124 U/L — AB (ref 39–117)
ALT: 16 U/L (ref 0–53)
AST: 17 U/L (ref 0–37)
Albumin: 3.5 g/dL (ref 3.5–5.2)
BILIRUBIN TOTAL: 0.8 mg/dL (ref 0.2–1.2)
BUN: 19 mg/dL (ref 6–23)
CALCIUM: 9.2 mg/dL (ref 8.4–10.5)
CO2: 29 mEq/L (ref 19–32)
Chloride: 98 mEq/L (ref 96–112)
Creatinine, Ser: 1.31 mg/dL (ref 0.40–1.50)
GFR: 70.47 mL/min (ref >60.00)
Glucose, Bld: 404 mg/dL — ABNORMAL HIGH (ref 70–99)
POTASSIUM: 5 meq/L (ref 3.5–5.1)
Sodium: 134 mEq/L — ABNORMAL LOW (ref 135–145)
TOTAL PROTEIN: 7.7 g/dL (ref 6.0–8.3)

## 2015-11-25 LAB — HEMOGLOBIN A1C

## 2015-11-25 LAB — MICROALBUMIN / CREATININE URINE RATIO
CREATININE, U: 42.7 mg/dL
MICROALB UR: 1 mg/dL (ref 0.0–1.9)
Microalb Creat Ratio: 2.3 mg/g (ref 0.0–30.0)

## 2015-11-25 MED ORDER — GLUCOSE BLOOD VI STRP: ORAL_STRIP | 12 refills | Status: DC

## 2015-11-25 MED ORDER — METFORMIN HCL ER 500 MG PO TB24: ORAL_TABLET | ORAL | 1 refills | Status: DC

## 2015-11-25 MED ORDER — ONETOUCH SURESOFT LANCING DEV MISC: 1 refills | Status: DC

## 2015-11-25 MED ORDER — ONETOUCH ULTRA MINI W/DEVICE KIT: PACK | 0 refills | Status: DC

## 2015-11-25 NOTE — Assessment & Plan Note (Signed)
Paroxysmal atrial fibrillation appears stable and in sinus rhythm on exam today. No adverse symptoms noted no nuisance bleeding. Continue current dosage of Eliquis.

## 2015-11-25 NOTE — Progress Notes (Signed)
Subjective:    Patient ID: [name] [name], male    DOB: [birth year], 65 y.o.   MRN: [id number]  Chief Complaint  Patient presents with  . Establish Care    wants to work on getting diabetic numbers down, has alot of feet pain    HPI:  [name] [name] is a 65 y.o. male who  has a past medical history of AICD (automatic cardioverter/defibrillator) present (02/05/2014); Atrial flutter ([place]) (04/2012); CAD (coronary artery disease) (2956,2130 X 2 ); CHF (congestive heart failure) ([place]); Chronic anticoagulation; Chronic systolic heart failure (HCC); CKD (chronic kidney disease); DM type 2 (diabetes mellitus, type 2) ([place]); HTN (hypertension); Hypercholesteremia; ICD (implantable cardiac defibrillator) in place; Ischemic cardiomyopathy (March 2015); Nephrolithiasis; and Ventricular tachycardia ([place]). and presents today for an office visit to establish care.  1.) Type 2 diabetes - Currently maintained on Novolog Relion 70/30 with 15 units in the morning and 15 units in the evening. Reports taking the medication as prescribed and denies adverse side effects. Neuropathy located in his bilateral feet with gabapentin usage which does help with his symptoms. Does have changes to his eye sight. Due for a diabetic eye exam and foot exam. Does not currently check his blood sugars at home. Watches what he eats and 80% is healthy. Not been to diabetic education in the past.   Lab Results  Component Value Date   HGBA1C 15.9 Repeated and verified X2. (H) 11/25/2015     2.) Chronic Systolic Heart Failure - Ischemic cardiomyopathy currently maintained on furosemide, losartan, carvedilol, and sotalol. Reports taken medications as prescribed and denies adverse side effects. Does continue to monitor his weight at home with no significant changes noted recently. Denies chest pain, shortness of breath, or heart palpitations. Most recent echocardiogram shows left ejection fraction of 20-25% with normal wall motion  and Doppler parameters consistent with restrictive physiology of decreased left ventricular diastolic compliance and/or increased left atrial pressure.  3.) Paroxysmal Atrial Fibrillation - Currently maintained on Eliquis for anticoagulation. Reports taking medication as prescribed and denies adverse side effects or nuisance bleeding. No chest pain, shortness of breath, or heart palpitations noted.  Allergies  Allergen Reactions  . Penicillins Other (See Comments)    Unknown reaction      Outpatient Medications Prior to Visit  Medication Sig Dispense Refill  . apixaban (ELIQUIS) 5 MG TABS tablet Take 1 tablet (5 mg total) by mouth 2 (two) times daily. 60 tablet 6  . atorvastatin (LIPITOR) 80 MG tablet Take 1 tablet (80 mg total) by mouth daily. 90 tablet 3  . carvedilol (COREG) 6.25 MG tablet Take 1 tablet (6.25 mg total) by mouth 2 (two) times daily with a meal. 60 tablet 9  . furosemide (LASIX) 80 MG tablet TAKE ONE TABLET BY MOUTH ONCE DAILY 90 tablet 3  . gabapentin (NEURONTIN) 300 MG capsule Take 1 capsule (300 mg total) by mouth 2 (two) times daily. 60 capsule 5  . insulin NPH-insulin regular (NOVOLIN 70/30 RELION) (70-30) 100 UNIT/ML injection Inject 15 Units into the skin 2 (two) times daily with a meal. 10 mL 12  . losartan (COZAAR) 25 MG tablet TAKE ONE TABLET BY MOUTH ONCE DAILY 30 tablet 10  . magnesium oxide (MAG-OX) 400 MG tablet Take 1 tablet (400 mg total) by mouth daily. 90 tablet 3  . Multiple Vitamins-Minerals (MULTIVITAMIN PO) Take 1 capsule by mouth daily.     . sotalol (BETAPACE) 120 MG tablet TAKE ONE TABLET BY MOUTH TWICE DAILY 180  tablet 3  . atorvastatin (LIPITOR) 80 MG tablet TAKE ONE TABLET BY MOUTH ONCE DAILY 90 tablet 3   No facility-administered medications prior to visit.      Past Medical History:  Diagnosis Date  . AICD (automatic cardioverter/defibrillator) present 02/05/2014   Upgrade to Medtronic biventricular ICD, serial number  [id number]   .  Atrial flutter ([place]) 04/2012   s/p TEE-EPS+RFCA 04/2012  . CAD (coronary artery disease) 7106,2694 X 2    RCA-T, 70% PL (off CFX), 99% Prox LAD/90% Dist LAD, S/P TAXUS stent x 2  . CHF (congestive heart failure) ([place])   . Chronic anticoagulation   . Chronic systolic heart failure ([place])   . CKD (chronic kidney disease)   . DM type 2 (diabetes mellitus, type 2) (HCC)    insulin dependent  . HTN (hypertension)   . Hypercholesteremia    ablation  . ICD (implantable cardiac defibrillator) in place   . Ischemic cardiomyopathy March 2015   20-25% 2D   . Nephrolithiasis   . Ventricular tachycardia [facility])       Past Surgical History:  Procedure Laterality Date  . ATRIAL FLUTTER ABLATION N/A 05/19/2012   Procedure: ATRIAL FLUTTER ABLATION;  Surgeon: Thompson Grayer, MD;  Location: [facility] CATH LAB;  Service: Cardiovascular;  Laterality: N/A;  . BI-VENTRICULAR IMPLANTABLE CARDIOVERTER DEFIBRILLATOR UPGRADE N/A 02/05/2014   Procedure: BI-VENTRICULAR IMPLANTABLE CARDIOVERTER DEFIBRILLATOR UPGRADE;  Surgeon: Evans Lance, MD;  Location: [facility] CATH LAB;  Service: Cardiovascular;  Laterality: N/A;  . BIV ICD GENERTAOR CHANGE OUT  02/05/2014   Upgrade to Medtronic biventricular ICD, serial number  [id number] by Dr. [name]  . CARDIAC DEFIBRILLATOR PLACEMENT  2007    Medtronic Maximo VR, serial number [id number]  . PERCUTANEOUS CORONARY STENT INTERVENTION (PCI-S)  January 2002   PTCA/Stent Distal RCA  . PERCUTANEOUS CORONARY STENT INTERVENTION (PCI-S)  June 2002   PTCA/Stent x 3 RCA, thrombolysis - failed  . PERCUTANEOUS CORONARY STENT INTERVENTION (PCI-S)  July 2006   TAXUS stents to prox and distal LAD      Family History  Problem Relation Age of Onset  . Heart failure Father     Deceased  . Alzheimer's disease Mother     Living  . Heart attack Mother   . CAD Brother   . CAD Brother   . CAD Brother   . CAD Brother   . Healthy Son   . Healthy Daughter   . Diabetes Brother       Social  History   Social History  . Marital status: Married    Spouse name: N/A  . Number of children: 4  . Years of education: 14   Occupational History  . brick layer - currently unemployed    Social History Main Topics  . Smoking status: Former Smoker    Packs/day: 1.00    Years: 29.00    Types: Cigarettes    Quit date: 03/02/2000  . Smokeless tobacco: Never Used  . Alcohol use No  . Drug use: No  . Sexual activity: Not on file   Other Topics Concern  . Not on file   Social History Narrative   Pt lives with wife in a one story home - married for 57 years. He hunts regularly and is active, walking > 1 mile at times without difficulty.  Has 4 children.     Retired Horticulturist, commercial.  Education: some college.      Review of Systems  Eyes:  Negative for changes in vision.  Respiratory: Negative for chest tightness and shortness of breath.   Cardiovascular: Negative for chest pain, palpitations and leg swelling.  Endocrine: Negative for polydipsia, polyphagia and polyuria.  Neurological: Negative for dizziness, weakness, light-headedness and headaches.       Objective:    BP 98/64 (BP Location: Left Arm, Patient Position: Sitting, Cuff Size: Normal)   Pulse (!) 55   Temp 97.9 F (36.6 C) (Oral)   Resp 16   Ht _0  (1.803 m)   Wt 231 lb (104.8 kg)   SpO2 98%   BMI 32.22 kg/m  Nursing note and vital signs reviewed.  Physical Exam  Constitutional: He is oriented to person, place, and time. He appears well-developed and well-nourished. No distress.  Cardiovascular: Normal rate, regular rhythm, normal heart sounds and intact distal pulses.   Pulmonary/Chest: Effort normal and breath sounds normal.  Neurological: He is alert and oriented to person, place, and time.  Diabetic Foot Exam - Simple   Simple Foot Form Diabetic Foot exam was performed with the following findings:  Yes  11/25/2015 10:09 AM  Visual Inspection No deformities, no ulcerations, no other skin  breakdown bilaterally:  Yes Sensation Testing See comments:  Yes Pulse Check Posterior Tibialis and Dorsalis pulse intact bilaterally:  Yes Comments Decreased sensation around bilateral great toes. And on top of left foot.     Skin: Skin is warm and dry.  Psychiatric: He has a normal mood and affect. His behavior is normal. Judgment and thought content normal.        Assessment & Plan:   Problem List Items Addressed This Visit      Cardiovascular and Mediastinum   HYPERTENSION, BENIGN SYSTEMIC    Hypertension well controlled with current regimen and no adverse side effects. Continue current dosage of losartan, furosemide, and carvedilol. Monitor blood pressures at home and follow low-sodium diet.      PAF (paroxysmal atrial fibrillation) (HCC)    Paroxysmal atrial fibrillation appears stable and in sinus rhythm on exam today. No adverse symptoms noted no nuisance bleeding. Continue current dosage of Eliquis.       Heart failure (HCC)    Heart failure appears stable with no current symptoms and maintained on losartan and furosemide. Maintained on carvedilol for reduction of sudden cardiac death. Continue to monitor blood pressures and weights at home. Continue current dosage of first night, losartan, and carvedilol with changes per cardiology. Note there is concern per most recent cardiology visit patient may not able to tolerate Entresto. Continue to monitor.         Endocrine   Diabetic polyneuropathy associated with diabetes mellitus due to underlying condition ([place]) - Primary    Type 2 diabetes with polyneuropathy and managed on gabapentin remains uncontrolled with current medication regimen. Patient has not been checking his blood sugars at home. Obtain A1c and urine microalbumin. Obtain complete metabolic profile for kidney and liver function. Increase Novolin 70/30-20 units in the morning and evening with titration instructions provided. Refer to ophthalmology for diabetic eye  exam. Diabetic foot exam completed today with mild decreases in sensation noted. Due for pneumonia vaccination at next office visit. Maintained on losartan and atorvastatin for CAD risk reduction. A new glucose monitoring supplies sent to pharmacy. Follow-up in one to 2 weeks with blood sugar numbers for further titration. Consider additional diabetic medication pending urine/renal function.      Relevant Medications   Blood Glucose Monitoring Suppl (ONE TOUCH ULTRA MINI)  w/Device KIT   glucose blood (ONE TOUCH ULTRA TEST) test strip   Lancets Misc. (ONE TOUCH SURESOFT) MISC   Other Relevant Orders   Comp Met (CMET) (Completed)   Ambulatory referral to Ophthalmology   HgB A1c (Completed)   Urine Microalbumin w/creat. ratio (Completed)     Genitourinary   Chronic renal disease, stage III    CKD appears stable. Obtain CMET to check kidney function and GFR. Continueto monitor.        Other Visit Diagnoses    Encounter for immunization       Relevant Orders   Flu vaccine HIGH DOSE PF (Completed)       I am having Mr. Yam start on ONE TOUCH ULTRA MINI, glucose blood, and ONE TOUCH SURESOFT. I am also having him maintain his insulin NPH-regular Human, magnesium oxide, Multiple Vitamins-Minerals (MULTIVITAMIN PO), atorvastatin, apixaban, sotalol, gabapentin, carvedilol, furosemide, and losartan.   Meds ordered this encounter  Medications  . Blood Glucose Monitoring Suppl (ONE TOUCH ULTRA MINI) w/Device KIT    Sig: Use meter to check blood sugars 1-4 times daily as instructed.    Dispense:  1 each    Refill:  0    Substitution permissible per insurance coverage. Dx E11.9.    Order Specific Question:   Supervising Provider    Answer:   Pricilla Holm A [[id number]]  . glucose blood (ONE TOUCH ULTRA TEST) test strip    Sig: Use one strip per test. Test blood sugars 1-4 times daily as instructed.    Dispense:  100 each    Refill:  12    Substitution permissible per insurance  coverage. Dx E11.9.    Order Specific Question:   Supervising Provider    Answer:   Pricilla Holm A [[id number]]  . Lancets Misc. (ONE TOUCH SURESOFT) MISC    Sig: Use 1 lancet per test. Test blood sugars 1-4 times per day as instructed.    Dispense:  1 each    Refill:  1    Substitution permissible per insurance coverage. Dx E11.9.    Order Specific Question:   Supervising Provider    Answer:   Pricilla Holm A [[id number]]     Follow-up: Return in about 2 weeks (around 12/09/2015).  Mauricio Po, FNP

## 2015-11-25 NOTE — Assessment & Plan Note (Signed)
Hypertension well controlled with current regimen and no adverse side effects. Continue current dosage of losartan, furosemide, and carvedilol. Monitor blood pressures at home and follow low-sodium diet.

## 2015-11-25 NOTE — Patient Instructions (Signed)
Thank you for choosing [facility].  SUMMARY AND INSTRUCTIONS:  Medication:  Please continue to take your medication as prescribed.  Your prescription(s) have been submitted to your pharmacy or been printed and provided for you. Please take as directed and contact our office if you believe you are having problem(s) with the medication(s) or have any questions.  Labs:  Please stop by the lab on the lower level of the building for your blood work. Your results will be released to [place] (or called to you) after review, usually within 72 hours after test completion. If any changes need to be made, you will be notified at that same time.  1.) The lab is open from 7:30am to 5:30 pm Monday-Friday 2.) No appointment is necessary 3.) Fasting (if needed) is 6-8 hours after food and drink; black coffee and water are okay    Referrals:  They will call to schedule your appointment with opthalmology.   Referrals have been made during this visit. You should expect to hear back from our schedulers in about 7-10 days in regards to establishing an appointment with the specialists we discussed.   Follow up:  If your symptoms worsen or fail to improve, please contact our office for further instruction, or in case of emergency go directly to the emergency room at the closest medical facility.   DIABETES CARE INSTRUCTIONS:  Current A1c:  Lab Results  Component Value Date   HGBA1C 11.6 (H) 05/29/2015    A1C Goal: <7.0%  Fasting Blood Sugar Goal: 80-130 Blood sugar check that you take after fasting for at least 8 hours which is usually in the morning before eating.  Post-prandial Blood Sugar Goal: <180 Blood sugar check approximately 1-2 hours after the start of a meal.    Dosing of Novolog 70/30:     Diabetes Prevention Screens:  1.) Ensure you have an annual eye exam by an ophthalmologist or optometrist.   2,) Ensure you have an annual foot exam or when any changes are noted  including new onset numbness/tingling or wounds. Check your feet daily!  3.) The American Diabetes Association recommends the Pneumovax vaccination against pneumonia every 5 years.  4.) We will check your kidney function with a urine test on an annual basis.

## 2015-11-25 NOTE — Assessment & Plan Note (Signed)
CKD appears stable. Obtain CMET to check kidney function and GFR. Continueto monitor.

## 2015-11-25 NOTE — Assessment & Plan Note (Signed)
Heart failure appears stable with no current symptoms and maintained on losartan and furosemide. Maintained on carvedilol for reduction of sudden cardiac death. Continue to monitor blood pressures and weights at home. Continue current dosage of first night, losartan, and carvedilol with changes per cardiology. Note there is concern per most recent cardiology visit patient may not able to tolerate Entresto. Continue to monitor.

## 2015-11-25 NOTE — Assessment & Plan Note (Signed)
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 Patient has not been checking his blood sugars at home. Obtain A1c and urine microalbumin. Obtain complete metabolic profile for kidney and liver function. Increase Novolin 70/30-20 units in the morning and evening with titration instructions provided. Refer to ophthalmology for diabetic eye exam. Diabetic foot exam completed today with mild decreases in sensation noted. Due for pneumonia vaccination at next office visit. Maintained on losartan and atorvastatin for CAD risk reduction. A new glucose monitoring supplies sent to pharmacy. Follow-up in one to 2 weeks with blood sugar numbers for further titration. Consider additional diabetic medication pending urine/renal function.

## 2015-12-18 ENCOUNTER — Ambulatory Visit (INDEPENDENT_AMBULATORY_CARE_PROVIDER_SITE_OTHER): Payer: PPO | Admitting: Neurology

## 2015-12-18 ENCOUNTER — Encounter: Payer: Self-pay | Admitting: Neurology

## 2015-12-18 VITALS — BP 90/60 | HR 82 | Ht 71.0 in | Wt 233.0 lb

## 2015-12-18 DIAGNOSIS — E0842 Diabetes mellitus due to underlying condition with diabetic polyneuropathy: Secondary | ICD-10-CM

## 2015-12-18 DIAGNOSIS — M792 Neuralgia and neuritis, unspecified: Secondary | ICD-10-CM | POA: Diagnosis not present

## 2015-12-18 MED ORDER — GABAPENTIN 300 MG PO CAPS: ORAL_CAPSULE | ORAL | 3 refills | Status: DC

## 2015-12-18 NOTE — Patient Instructions (Addendum)
1.  Apply AsperCream to feet twice daily 2.  Increase gabapentin to 300mg  in the morning and 600mg  bedtime  Return to clinic in 6 months

## 2015-12-18 NOTE — Progress Notes (Signed)
Follow-up Visit   Date: 12/18/15    [name] [name] MRN: [id number] DOB: [birth year]   Interim History: [name] [name] is a 65 y.o. right-handed African American male with insulin-dependent diabetes mellitus, atrial flutter, CAD, CHF s/p ICD, hypertension, hyperlipidemia, and CKD returning to the clinic for follow-up of diabetic neuropathy.  The patient was accompanied to the clinic by self.  History of present illness: Starting around October 2015 he was admitted with CHF exacerbation and recalls having severe swelling of the legs. Around the same time, he began noticing tingling pain involving the feet and lower calf which has gradually been worsening over the past year. It is worse after he has been walking all day and seem less painful in the morning. He cannot stand anyone touching his feet, because it irritates it. Symptoms are worse if he is wearing constrictive shoes. There is no numbness or weakness. He has tried ibuprofen and aspirin which does not help. He endorses difficulty with balance, but has not fallen and walks independently.   He has been diabetes for 15+ years and insulin-dependent for most of this time. The last HbA1c in our system is from 2014 where his diabetes was poorly controlled at 11.5, he is unsure if this has been checked with his PCP since this time.   UPDATE 08/29/2015:  Since taking gabapentin 248m twice daily, he has noticed mild improvement with respect to tingling by about 20%.  He has occasional sharp stabbing pain which very brief and transient.  Balance and weakness is stable.  No new complaints.  UPDATE 10/18.2017:  He continues to appreciate benefit with gabapentin 3034mBID and especially has noticed that Aspercream helps, but at nighttime, the burning pain can still be severe.  He would like to increase the medication, as he does not experience any side effects.  His diabetes remains poorly controlled with his last HbA1c 15.9, but is  making much stricter diet choices and trying to eliminate sweet tea and starches.   Medications:  Current Outpatient Prescriptions on File Prior to Visit  Medication Sig Dispense Refill  . apixaban (ELIQUIS) 5 MG TABS tablet Take 1 tablet (5 mg total) by mouth 2 (two) times daily. 60 tablet 6  . atorvastatin (LIPITOR) 80 MG tablet Take 1 tablet (80 mg total) by mouth daily. 90 tablet 3  . Blood Glucose Monitoring Suppl (ONE TOUCH ULTRA MINI) w/Device KIT Use meter to check blood sugars 1-4 times daily as instructed. 1 each 0  . carvedilol (COREG) 6.25 MG tablet Take 1 tablet (6.25 mg total) by mouth 2 (two) times daily with a meal. 60 tablet 9  . furosemide (LASIX) 80 MG tablet TAKE ONE TABLET BY MOUTH ONCE DAILY 90 tablet 3  . glucose blood (ONE TOUCH ULTRA TEST) test strip Use one strip per test. Test blood sugars 1-4 times daily as instructed. 100 each 12  . insulin NPH-insulin regular (NOVOLIN 70/30 RELION) (70-30) 100 UNIT/ML injection Inject 15 Units into the skin 2 (two) times daily with a meal. 10 mL 12  . Lancets Misc. (ONE TOUCH SURESOFT) MISC Use 1 lancet per test. Test blood sugars 1-4 times per day as instructed. 1 each 1  . losartan (COZAAR) 25 MG tablet TAKE ONE TABLET BY MOUTH ONCE DAILY 30 tablet 10  . magnesium oxide (MAG-OX) 400 MG tablet Take 1 tablet (400 mg total) by mouth daily. 90 tablet 3  . metFORMIN (GLUCOPHAGE XR) 500 MG 24 hr tablet Take 1 tablet by  mouth daily for 1 week and increase to 2 tablets daily if blood sugars remain uncontrolled. 60 tablet 1  . Multiple Vitamins-Minerals (MULTIVITAMIN PO) Take 1 capsule by mouth daily.     . sotalol (BETAPACE) 120 MG tablet TAKE ONE TABLET BY MOUTH TWICE DAILY 180 tablet 3   No current facility-administered medications on file prior to visit.     Allergies:  Allergies  Allergen Reactions  . Penicillins Other (See Comments)    Unknown reaction    Review of Systems:  CONSTITUTIONAL: No fevers, chills, night sweats,  or weight loss.  EYES: No visual changes or eye pain ENT: No hearing changes.  No history of nose bleeds.   RESPIRATORY: No cough, wheezing and shortness of breath.   CARDIOVASCULAR: Negative for chest pain, and palpitations.   GI: Negative for abdominal discomfort, blood in stools or black stools.  No recent change in bowel habits.   GU:  No history of incontinence.   MUSCLOSKELETAL: No history of joint pain or swelling.  No myalgias.   SKIN: Negative for lesions, rash, and itching.   ENDOCRINE: Negative for cold or heat intolerance, polydipsia or goiter.   PSYCH:  No depression or anxiety symptoms.   NEURO: As Above.   Vital Signs:  BP 90/60   Pulse 82   Ht '5\' 11"'  (1.803 m)   Wt 233 lb (105.7 kg)   BMI 32.50 kg/m   Neurological Exam: MENTAL STATUS including orientation to time, place, person, recent and remote memory, attention span and concentration, language, and fund of knowledge is normal.  Speech is not dysarthric.  CRANIAL NERVES: Pupils equal round and reactive to light.  Normal conjugate, extra-ocular eye movements in all directions of gaze.  No ptosis.  Face is symmetric. Palate elevates symmetrically.  Tongue is midline.  MOTOR:  Motor strength is 5/5 in all extremities, except toe flexion and extension 4/5.    MSRs:  Reflexes are 2+/4 throughout, except 1+/4 patella.  SENSORY: Vibration is reduced distal to the ankles bilaterally.   COORDINATION/GAIT:  Gait slightly wide-based and stable; unassisted.   Data: Labs 05/29/2015:  [place] 134, TSH 4.29, HbA1c 11.6*, vitamin B12 751 Lab Results  Component Value Date   HGBA1C 15.9 Repeated and verified X2. (H) 11/25/2015    IMPRESSION/PLAN: 1.  Diabetic distal and symmetric polyneuropathy, slightly improved  - Encouraged tight control of blood sugars as his HbA1c remains markedly elevated  - Increase gabapentin to 334m in the morning and 6024mat bedtime, can increase further as needed, keeping in mind his CKD  -  Apply Aspercream to the feet twice daily  - Fall precautions discussed, esepcially because he is on Eliquis but balance overall seems okay, mostly bothersome on uneven surfaces  2.  Insulin-dependent diabetes mellitus, uncontrolled but making a conscious effort with his diet  3.  Chronic kidney disease  4. Atrial flutter on Eliquis  Return to clinic in 6 months  The duration of this appointment visit was 20 minutes of face-to-face time with the patient.  Greater than 50% of this time was spent in counseling, explanation of diagnosis, planning of further management, and coordination of care.   Thank you for allowing me to participate in patient's care.  If I can answer any additional questions, I would be pleased to do so.    Sincerely,    Benino Korinek K. PaPosey ProntoDO

## 2015-12-23 ENCOUNTER — Telehealth: Payer: Self-pay | Admitting: Cardiology

## 2015-12-23 NOTE — Telephone Encounter (Signed)
LMOVM reminding pt to send remote transmission.   

## 2015-12-26 ENCOUNTER — Ambulatory Visit (INDEPENDENT_AMBULATORY_CARE_PROVIDER_SITE_OTHER): Payer: PPO | Admitting: Family

## 2015-12-26 ENCOUNTER — Encounter: Payer: Self-pay | Admitting: Family

## 2015-12-26 VITALS — BP 92/64 | HR 64 | Temp 98.2°F | Wt 233.0 lb

## 2015-12-26 DIAGNOSIS — E0842 Diabetes mellitus due to underlying condition with diabetic polyneuropathy: Secondary | ICD-10-CM

## 2015-12-26 MED ORDER — INSULIN NPH ISOPHANE & REGULAR (70-30) 100 UNIT/ML ~~LOC~~ SUSP: 30.0000 [IU] | Freq: Two times a day (BID) | SUBCUTANEOUS | 12 refills | Status: DC

## 2015-12-26 MED ORDER — METFORMIN HCL ER 750 MG PO TB24: 1500.0000 mg | ORAL_TABLET | Freq: Every day | ORAL | 1 refills | Status: DC

## 2015-12-26 NOTE — Patient Instructions (Addendum)
Thank you for choosing [facility].  SUMMARY AND INSTRUCTIONS:  Check on price of sildenafil 20 mg tablets.   If needed we can send to [facility].  Medication:  Increase your metformin to 1500 mg daily - which is 2 750 mg tabs.  Increase insulin to 30 units in the morning and evening.   Your prescription(s) have been submitted to your pharmacy or been printed and provided for you. Please take as directed and contact our office if you believe you are having problem(s) with the medication(s) or have any questions.  Follow up:  If your symptoms worsen or fail to improve, please contact our office for further instruction, or in case of emergency go directly to the emergency room at the closest medical facility.

## 2015-12-26 NOTE — Progress Notes (Signed)
Pre visit review using our clinic review tool, if applicable. No additional management support is needed unless otherwise documented below in the visit note. 

## 2015-12-26 NOTE — Progress Notes (Addendum)
Subjective:    Patient ID: [name] [name], male    DOB: 04/21/1950, 65 y.o.   MRN: [id number]  Chief Complaint  Patient presents with  . Diabetes    HPI:  [name] [name] is a 65 y.o. male who  has a past medical history of AICD (automatic cardioverter/defibrillator) present (02/05/2014); Atrial flutter ([place]) (04/2012); CAD (coronary artery disease) (8016,5537 X 2 ); CHF (congestive heart failure) ([place]); Chronic anticoagulation; Chronic systolic heart failure ([facility]); CKD (chronic kidney disease); DM type 2 (diabetes mellitus, type 2) ([place]); HTN (hypertension); Hypercholesteremia; ICD (implantable cardiac defibrillator) in place; Ischemic cardiomyopathy (March 2015); Nephrolithiasis; and Ventricular tachycardia ([place]). and presents today for a follow up office visit.   1.) Type 2 diabetes - Currently maintained on metformin and Novolin 70/30 and taking 24 units in the morning and 25 units at night. Blood sugars at home averaging around  260. Denies symptoms of end organ damage. Does express some concern with erectile dysfunction. Working on adjusting his dietary intak  Lab Results  Component Value Date   HGBA1C 15.9 Repeated and verified X2. (H) 11/25/2015      Allergies  Allergen Reactions  . Penicillins Other (See Comments)    Unknown reaction      Outpatient Medications Prior to Visit  Medication Sig Dispense Refill  . apixaban (ELIQUIS) 5 MG TABS tablet Take 1 tablet (5 mg total) by mouth 2 (two) times daily. 60 tablet 6  . atorvastatin (LIPITOR) 80 MG tablet Take 1 tablet (80 mg total) by mouth daily. 90 tablet 3  . Blood Glucose Monitoring Suppl (ONE TOUCH ULTRA MINI) w/Device KIT Use meter to check blood sugars 1-4 times daily as instructed. 1 each 0  . carvedilol (COREG) 6.25 MG tablet Take 1 tablet (6.25 mg total) by mouth 2 (two) times daily with a meal. 60 tablet 9  . furosemide (LASIX) 80 MG tablet TAKE ONE TABLET BY MOUTH ONCE DAILY 90 tablet 3  . gabapentin  (NEURONTIN) 300 MG capsule Take 1 tablet in the morning and 2 tablets at bedtime 270 capsule 3  . glucose blood (ONE TOUCH ULTRA TEST) test strip Use one strip per test. Test blood sugars 1-4 times daily as instructed. 100 each 12  . Lancets Misc. (ONE TOUCH SURESOFT) MISC Use 1 lancet per test. Test blood sugars 1-4 times per day as instructed. 1 each 1  . losartan (COZAAR) 25 MG tablet TAKE ONE TABLET BY MOUTH ONCE DAILY 30 tablet 10  . magnesium oxide (MAG-OX) 400 MG tablet Take 1 tablet (400 mg total) by mouth daily. 90 tablet 3  . Multiple Vitamins-Minerals (MULTIVITAMIN [name]) Take 1 capsule by mouth daily.     . sotalol (BETAPACE) 120 MG tablet TAKE ONE TABLET BY MOUTH TWICE DAILY 180 tablet 3  . insulin NPH-insulin regular (NOVOLIN 70/30 RELION) (70-30) 100 UNIT/ML injection Inject 15 Units into the skin 2 (two) times daily with a meal. 10 mL 12  . metFORMIN (GLUCOPHAGE XR) 500 MG 24 hr tablet Take 1 tablet by mouth daily for 1 week and increase to 2 tablets daily if blood sugars remain uncontrolled. 60 tablet 1   No facility-administered medications prior to visit.     Review of Systems  Eyes:       Negative for changes in vision.  Respiratory: Negative for chest tightness and shortness of breath.   Cardiovascular: Negative for chest pain, palpitations and leg swelling.  Endocrine: Negative for polydipsia, polyphagia and polyuria.  Genitourinary:  Positive for erectile dysfunction  Neurological: Negative for dizziness, weakness, light-headedness and headaches.      Objective:    BP 92/64   Pulse 64   Temp 98.2 F (36.8 C)   Wt 233 lb (105.7 kg)   SpO2 97%   BMI 32.50 kg/m  Nursing note and vital signs reviewed.  Physical Exam  Constitutional: He is oriented to person, place, and time. He appears well-developed and well-nourished. No distress.  Cardiovascular: Normal rate, regular rhythm, normal heart sounds and intact distal pulses.   Pulmonary/Chest: Effort normal  and breath sounds normal.  Neurological: He is alert and oriented to person, place, and time.  Skin: Skin is warm and dry.  Psychiatric: He has a normal mood and affect. His behavior is normal. Judgment and thought content normal.       Assessment & Plan:   Problem List Items Addressed This Visit      Endocrine   Diabetic polyneuropathy associated with diabetes mellitus due to underlying condition ([place]) - Primary    Blood sugars appear to be improving with the current regimen although remain high with home readings in the 260s on average. Increase metformin to 1500 mg daily and increase Novolog 70/30 to 30 units twice daily.Continue to monitor blood sugars at home. Follow up in 2 months for A1c check.       Relevant Medications   metFORMIN (GLUCOPHAGE-XR) 750 MG 24 hr tablet   insulin NPH-regular Human (NOVOLIN 70/30 RELION) (70-30) 100 UNIT/ML injection    Other Visit Diagnoses   None.      I have discontinued Mr. Glidden metFORMIN. I have also changed his insulin NPH-regular Human. Additionally, I am having him start on metFORMIN. Lastly, I am having him maintain his magnesium oxide, Multiple Vitamins-Minerals (MULTIVITAMIN [name]), atorvastatin, apixaban, sotalol, carvedilol, furosemide, losartan, ONE TOUCH ULTRA MINI, glucose blood, ONE TOUCH SURESOFT, and gabapentin.   Meds ordered this encounter  Medications  . metFORMIN (GLUCOPHAGE-XR) 750 MG 24 hr tablet    Sig: Take 2 tablets (1,500 mg total) by mouth daily with breakfast.    Dispense:  60 tablet    Refill:  1    Order Specific Question:   Supervising Provider    Answer:   Pricilla Holm A [[id number]]  . insulin NPH-regular Human (NOVOLIN 70/30 RELION) (70-30) 100 UNIT/ML injection    Sig: Inject 30 Units into the skin 2 (two) times daily with a meal.    Dispense:  10 mL    Refill:  12    Do not fill - medication update.    Order Specific Question:   Supervising Provider    Answer:   Pricilla Holm A [[id number]]      Follow-up: Return in about 2 months (around 02/25/2016), or if symptoms worsen or fail to improve.  [name] Po, FNP

## 2015-12-26 NOTE — Assessment & Plan Note (Signed)
Blood sugars appear to be improving with the current regimen although remain high with home readings in the 260s on average. Increase metformin to 1500 mg daily and increase Novolog 70/30 to 30 units twice daily.Continue to monitor blood sugars at home. Follow up in 2 months for A1c check.

## 2015-12-27 NOTE — Telephone Encounter (Signed)
No remote transmission received.  Next ICM remote transmission scheduled for 01/15/2016

## 2015-12-30 ENCOUNTER — Telehealth: Payer: Self-pay | Admitting: Family

## 2015-12-30 NOTE — Telephone Encounter (Signed)
Patient is calling back to report the price of sildenafil as requested. --$ 96.  Please call patient

## 2016-01-03 DIAGNOSIS — H25811 Combined forms of age-related cataract, right eye: Secondary | ICD-10-CM | POA: Diagnosis not present

## 2016-01-03 DIAGNOSIS — H25813 Combined forms of age-related cataract, bilateral: Secondary | ICD-10-CM | POA: Diagnosis not present

## 2016-01-03 DIAGNOSIS — E083413 Diabetes mellitus due to underlying condition with severe nonproliferative diabetic retinopathy with macular edema, bilateral: Secondary | ICD-10-CM | POA: Diagnosis not present

## 2016-01-04 DIAGNOSIS — H25813 Combined forms of age-related cataract, bilateral: Secondary | ICD-10-CM | POA: Diagnosis not present

## 2016-01-04 DIAGNOSIS — E113311 Type 2 diabetes mellitus with moderate nonproliferative diabetic retinopathy with macular edema, right eye: Secondary | ICD-10-CM | POA: Diagnosis not present

## 2016-01-04 DIAGNOSIS — H35033 Hypertensive retinopathy, bilateral: Secondary | ICD-10-CM | POA: Diagnosis not present

## 2016-01-04 DIAGNOSIS — E113312 Type 2 diabetes mellitus with moderate nonproliferative diabetic retinopathy with macular edema, left eye: Secondary | ICD-10-CM | POA: Diagnosis not present

## 2016-01-08 MED ORDER — SILDENAFIL CITRATE 20 MG PO TABS: 20.0000 mg | ORAL_TABLET | Freq: Every day | ORAL | 0 refills | Status: DC | PRN

## 2016-01-08 NOTE — Addendum Note (Signed)
Addended by: Mauricio Po D on: 01/08/2016 03:11 PM   Modules accepted: Orders

## 2016-01-08 NOTE — Telephone Encounter (Signed)
Is this affordable and wanting me to send it in?

## 2016-01-08 NOTE — Telephone Encounter (Signed)
Medication sent to pharmacy  

## 2016-01-08 NOTE — Telephone Encounter (Signed)
Received call from pt he is wanting the sildenafil sent to his pharmacy...[name] [name]

## 2016-01-13 DIAGNOSIS — E113312 Type 2 diabetes mellitus with moderate nonproliferative diabetic retinopathy with macular edema, left eye: Secondary | ICD-10-CM | POA: Diagnosis not present

## 2016-01-15 ENCOUNTER — Telehealth: Payer: Self-pay | Admitting: Cardiology

## 2016-01-15 DIAGNOSIS — H25812 Combined forms of age-related cataract, left eye: Secondary | ICD-10-CM | POA: Diagnosis not present

## 2016-01-15 DIAGNOSIS — H2512 Age-related nuclear cataract, left eye: Secondary | ICD-10-CM | POA: Diagnosis not present

## 2016-01-15 NOTE — Telephone Encounter (Signed)
LMOVM reminding pt to send remote transmission.   

## 2016-01-20 DIAGNOSIS — E113311 Type 2 diabetes mellitus with moderate nonproliferative diabetic retinopathy with macular edema, right eye: Secondary | ICD-10-CM | POA: Diagnosis not present

## 2016-01-20 NOTE — Progress Notes (Signed)
No ICM remote transmission received on 01/15/2016.   Next ICM transmission scheduled for 02/06/2016.

## 2016-01-27 DIAGNOSIS — H2511 Age-related nuclear cataract, right eye: Secondary | ICD-10-CM | POA: Diagnosis not present

## 2016-01-29 DIAGNOSIS — H2511 Age-related nuclear cataract, right eye: Secondary | ICD-10-CM | POA: Diagnosis not present

## 2016-01-29 DIAGNOSIS — H25811 Combined forms of age-related cataract, right eye: Secondary | ICD-10-CM | POA: Diagnosis not present

## 2016-02-06 ENCOUNTER — Telehealth: Payer: Self-pay | Admitting: Cardiology

## 2016-02-06 NOTE — Telephone Encounter (Signed)
LMOVM reminding pt to send remote transmission.   

## 2016-02-10 NOTE — Progress Notes (Signed)
No ICM remote transmission received on 02/06/2016.   Next ICM transmission scheduled for 02/20/2016. 

## 2016-02-11 DIAGNOSIS — E113312 Type 2 diabetes mellitus with moderate nonproliferative diabetic retinopathy with macular edema, left eye: Secondary | ICD-10-CM | POA: Diagnosis not present

## 2016-02-17 DIAGNOSIS — E113311 Type 2 diabetes mellitus with moderate nonproliferative diabetic retinopathy with macular edema, right eye: Secondary | ICD-10-CM | POA: Diagnosis not present

## 2016-02-20 ENCOUNTER — Telehealth: Payer: Self-pay

## 2016-02-20 ENCOUNTER — Ambulatory Visit (INDEPENDENT_AMBULATORY_CARE_PROVIDER_SITE_OTHER): Payer: PPO

## 2016-02-20 DIAGNOSIS — Z95810 Presence of automatic (implantable) cardiac defibrillator: Secondary | ICD-10-CM

## 2016-02-20 DIAGNOSIS — I5022 Chronic systolic (congestive) heart failure: Secondary | ICD-10-CM

## 2016-02-20 NOTE — Progress Notes (Signed)
EPIC Encounter for ICM Monitoring  Patient Name: [name] [name] is a 65 y.o. male Date: 02/20/2016 Primary Care Physican: Mauricio Po, [place] Primary Cardiologist: [name] Electrophysiologist: [name] Dry Weight:     unknown Bi-V Pacing:  90.5%          Attempted ICM call and unable to reach.   Transmission reviewed.   Thoracic impedance abnormal suggesting fluid accumulation.  Recommendations: NONE- Unable to reach  Follow-up plan: ICM clinic phone appointment on 02/28/2016 to recheck fluid level.  Copy of ICM check sent to primary cardiologist and device physician.   ICM trend: 02/20/2016       [name], RN 02/20/2016 3:01 PM   '

## 2016-02-20 NOTE — Telephone Encounter (Signed)
Remote ICM transmission received.  Attempted patient call and no answer or answering machine.  

## 2016-02-28 ENCOUNTER — Telehealth: Payer: Self-pay

## 2016-02-28 ENCOUNTER — Ambulatory Visit (INDEPENDENT_AMBULATORY_CARE_PROVIDER_SITE_OTHER): Payer: PPO

## 2016-02-28 DIAGNOSIS — Z95810 Presence of automatic (implantable) cardiac defibrillator: Secondary | ICD-10-CM

## 2016-02-28 DIAGNOSIS — I5022 Chronic systolic (congestive) heart failure: Secondary | ICD-10-CM

## 2016-02-28 NOTE — Telephone Encounter (Signed)
Attempted ICM call and left message for return call.  Provided ICM direct number.

## 2016-02-28 NOTE — Progress Notes (Signed)
EPIC Encounter for ICM Monitoring  Patient Name: [name] [name] is a 64 y.o. male Date: 02/28/2016 Primary Care Physican: Mauricio Po, [place] Primary Cardiologist:Crenshaw Electrophysiologist: [name] Dry Weight:unknown Bi-V Pacing: 94.3%           Attempted ICM call and unable to reach.  Left message to return call regarding transmission.  Transmission reviewed.   Thoracic impedance abnormal suggesting fluid accumulation since 11/25.  Labs: 11/25/2015 Creatinine 1.31, BUN 19, Potassium 5.0, Sodium 134 10/11/2015 Creatinine 1.23, BUN 22, Potassium 5.2, Sodium 136  04/03/2015 Creatinine 1.56, BUN 35, Potassium 5.4, Sodium 133   Recommendations:   NONE - Unable to reach.    Follow-up plan: ICM clinic phone appointment on 03/12/2016 to recheck fluid levels.  Copy of ICM check sent to primary cardiologist and device physician.   3 month ICM trend : 02/28/2016   1 Year ICM trend:      [name], RN 02/28/2016 8:57 AM

## 2016-03-06 DIAGNOSIS — H35033 Hypertensive retinopathy, bilateral: Secondary | ICD-10-CM | POA: Diagnosis not present

## 2016-03-06 DIAGNOSIS — E113412 Type 2 diabetes mellitus with severe nonproliferative diabetic retinopathy with macular edema, left eye: Secondary | ICD-10-CM | POA: Diagnosis not present

## 2016-03-06 DIAGNOSIS — E113311 Type 2 diabetes mellitus with moderate nonproliferative diabetic retinopathy with macular edema, right eye: Secondary | ICD-10-CM | POA: Diagnosis not present

## 2016-03-06 DIAGNOSIS — H25813 Combined forms of age-related cataract, bilateral: Secondary | ICD-10-CM | POA: Diagnosis not present

## 2016-03-12 ENCOUNTER — Telehealth: Payer: Self-pay

## 2016-03-12 ENCOUNTER — Ambulatory Visit (INDEPENDENT_AMBULATORY_CARE_PROVIDER_SITE_OTHER): Payer: PPO

## 2016-03-12 DIAGNOSIS — I5022 Chronic systolic (congestive) heart failure: Secondary | ICD-10-CM

## 2016-03-12 DIAGNOSIS — Z95810 Presence of automatic (implantable) cardiac defibrillator: Secondary | ICD-10-CM

## 2016-03-12 NOTE — Progress Notes (Signed)
EPIC Encounter for ICM Monitoring  Patient Name: [name] [name] is a 66 y.o. male Date: 03/12/2016 Primary Care Physican: Mauricio Po, [place] Primary Cardiologist:Crenshaw Electrophysiologist: [name] Dry Weight:unknown Bi-V Pacing: 94.3%                                                Attempted ICM call and unable to reach.  Left message detailed message regarding transmission and to return call.  Transmission reviewed.   Thoracic impedance abnormal suggesting fluid accumulation since 02/05/2017.  Labs: 11/25/2015 Creatinine 1.31, BUN 19, Potassium 5.0, Sodium 134 10/11/2015 Creatinine 1.23, BUN 22, Potassium 5.2, Sodium 136  04/03/2015 Creatinine 1.56, BUN 35, Potassium 5.4, Sodium 133   Recommendations:  NONE - Unable to reach    Follow-up plan: ICM clinic phone appointment on 03/23/2016 to recheck fluid levels.  Copy of ICM check sent to primary cardiologist and device physician.   3 month ICM trend : 03/12/2016   1 Year ICM trend:      [name], RN 03/12/2016 9:40 AM

## 2016-03-12 NOTE — Telephone Encounter (Signed)
Remote ICM transmission received.  Attempted patient call and left detailed message regarding transmission and to return call.    

## 2016-03-13 DIAGNOSIS — E113412 Type 2 diabetes mellitus with severe nonproliferative diabetic retinopathy with macular edema, left eye: Secondary | ICD-10-CM | POA: Diagnosis not present

## 2016-03-14 ENCOUNTER — Other Ambulatory Visit: Payer: Self-pay | Admitting: Cardiology

## 2016-03-14 ENCOUNTER — Other Ambulatory Visit: Payer: Self-pay | Admitting: Family

## 2016-03-14 DIAGNOSIS — E0842 Diabetes mellitus due to underlying condition with diabetic polyneuropathy: Secondary | ICD-10-CM

## 2016-03-16 NOTE — Telephone Encounter (Signed)
Rx(s) sent to pharmacy electronically.  

## 2016-03-23 ENCOUNTER — Ambulatory Visit (INDEPENDENT_AMBULATORY_CARE_PROVIDER_SITE_OTHER): Payer: PPO

## 2016-03-23 ENCOUNTER — Telehealth: Payer: Self-pay | Admitting: Cardiology

## 2016-03-23 DIAGNOSIS — I5022 Chronic systolic (congestive) heart failure: Secondary | ICD-10-CM

## 2016-03-23 DIAGNOSIS — Z95810 Presence of automatic (implantable) cardiac defibrillator: Secondary | ICD-10-CM

## 2016-03-23 NOTE — Telephone Encounter (Signed)
LMOVM reminding pt to send remote transmission.   

## 2016-03-24 DIAGNOSIS — Z95810 Presence of automatic (implantable) cardiac defibrillator: Secondary | ICD-10-CM | POA: Diagnosis not present

## 2016-03-24 DIAGNOSIS — I5022 Chronic systolic (congestive) heart failure: Secondary | ICD-10-CM | POA: Diagnosis not present

## 2016-03-24 NOTE — Progress Notes (Signed)
EPIC Encounter for ICM Monitoring  Patient Name: [name] [name] is a 66 y.o. male Date: 03/24/2016 Primary Care Physican: Mauricio Po, [place] Primary Cardiologist:Crenshaw Electrophysiologist: [name] Dry Weight:229 lbs Bi-V Pacing: 96.5%      Heart Failure questions reviewed, pt symptomatic with feet swelling and weight gain.  In last week his weight was as high as 231 lbs and today is 229 lbs.  Baseline weight is 218-223 lbs.  Before Christmas he was 235 lbs.     He reported his normal dosage of Furosemide is 80 mg daily.  Last night and this am he took 1 1/2 tablets (120 mg total) of Furosemide but symptoms have not improved.  He said he thinks he medical doctor has told him he could take extra but he is not sure.    Thoracic impedance abnormal suggesting fluid accumulation since 1st week of December.  Labs: 11/25/2015 Creatinine 1.31, BUN 19, Potassium 5.0, Sodium 134 10/11/2015 Creatinine 1.23, BUN 22, Potassium 5.2, Sodium 136  04/03/2015 Creatinine 1.56, BUN 35, Potassium 5.4, Sodium 133   Recommendations:  Advised will send copy to Dr [name] and Dr [name] for review and recommendations since he is having symptoms and transmission shows fluid accumulation.   Follow-up plan: ICM clinic phone appointment on 04/02/2016 to recheck fluid levels.   Advised patient he is due to make an appointment with Dr [name] (recall for February) and overdue to make an appointment with Dr [name].  Advised to call Dr Jacalyn Lefevre office today to make an appt and will have a scheduler for Dr [name] call him to make yearly appt with Dr [name].    3 month ICM trend: 03/24/2016   1 Year ICM trend:      [name], RN 03/24/2016 8:07 AM

## 2016-03-25 ENCOUNTER — Encounter: Payer: Self-pay | Admitting: Internal Medicine

## 2016-03-25 ENCOUNTER — Ambulatory Visit (INDEPENDENT_AMBULATORY_CARE_PROVIDER_SITE_OTHER): Payer: PPO | Admitting: Internal Medicine

## 2016-03-25 VITALS — BP 98/66 | HR 62 | Ht 70.0 in | Wt 234.8 lb

## 2016-03-25 DIAGNOSIS — I472 Ventricular tachycardia, unspecified: Secondary | ICD-10-CM

## 2016-03-25 DIAGNOSIS — I5022 Chronic systolic (congestive) heart failure: Secondary | ICD-10-CM

## 2016-03-25 DIAGNOSIS — Z95810 Presence of automatic (implantable) cardiac defibrillator: Secondary | ICD-10-CM | POA: Diagnosis not present

## 2016-03-25 DIAGNOSIS — Z961 Presence of intraocular lens: Secondary | ICD-10-CM | POA: Diagnosis not present

## 2016-03-25 DIAGNOSIS — I4729 Other ventricular tachycardia: Secondary | ICD-10-CM

## 2016-03-25 LAB — CUP PACEART INCLINIC DEVICE CHECK
Battery Remaining Longevity: 97 mo
Battery Voltage: 2.99 V
Brady Statistic AP VP Percent: 0.4 %
Brady Statistic AP VS Percent: 4.59 %
Brady Statistic AS VP Percent: 92.56 %
Brady Statistic AS VS Percent: 2.45 %
Brady Statistic RA Percent Paced: 4.87 %
Brady Statistic RV Percent Paced: 92.31 %
Date Time Interrogation Session: 20180124163023
HighPow Impedance: 41 Ohm
HighPow Impedance: 52 Ohm
Implantable Lead Implant Date: 20071219
Implantable Lead Implant Date: 20151207
Implantable Lead Implant Date: 20151207
Implantable Lead Location: 753858
Implantable Lead Location: 753859
Implantable Lead Location: 753860
Implantable Lead Model: 4598
Implantable Lead Model: 5076
Implantable Lead Model: 6947
Implantable Pulse Generator Implant Date: 20151207
Lead Channel Impedance Value: 304 Ohm
Lead Channel Impedance Value: 323 Ohm
Lead Channel Impedance Value: 342 Ohm
Lead Channel Impedance Value: 380 Ohm
Lead Channel Impedance Value: 380 Ohm
Lead Channel Impedance Value: 399 Ohm
Lead Channel Impedance Value: 494 Ohm
Lead Channel Impedance Value: 570 Ohm
Lead Channel Impedance Value: 570 Ohm
Lead Channel Impedance Value: 570 Ohm
Lead Channel Impedance Value: 817 Ohm
Lead Channel Impedance Value: 836 Ohm
Lead Channel Impedance Value: 855 Ohm
Lead Channel Pacing Threshold Amplitude: 0.75 V
Lead Channel Pacing Threshold Amplitude: 0.75 V
Lead Channel Pacing Threshold Amplitude: 0.75 V
Lead Channel Pacing Threshold Pulse Width: 0.4 ms
Lead Channel Pacing Threshold Pulse Width: 0.4 ms
Lead Channel Pacing Threshold Pulse Width: 0.4 ms
Lead Channel Sensing Intrinsic Amplitude: 2.375 mV
Lead Channel Sensing Intrinsic Amplitude: 6.5 mV
Lead Channel Setting Pacing Amplitude: 0.5 V
Lead Channel Setting Pacing Amplitude: 1.5 V
Lead Channel Setting Pacing Amplitude: 2 V
Lead Channel Setting Pacing Pulse Width: 0.03 ms
Lead Channel Setting Pacing Pulse Width: 0.4 ms
Lead Channel Setting Sensing Sensitivity: 0.3 mV

## 2016-03-25 MED ORDER — FUROSEMIDE 80 MG PO TABS: ORAL_TABLET | ORAL | 5 refills | Status: DC

## 2016-03-25 NOTE — Progress Notes (Signed)
HPI [name] [name] returns today for followup. He is a pleasant 66 yo man with an ICM, chronic class 3, now class 2 CHF, and VT. He has no ICD shocks in over 2 years while on sotalol. His atrial fib has also been well controlled. He has done well and denies chest pain. No syncope. No ICD shock. He has had an improvement in his heart failure symptoms since his BiV ICD was placed. We have purposely made his RV output below capture so as not to RV apically pace the right ventricle. He has had some volume overload but not symptomatically worse, noting peripheral edema. Allergies  Allergen Reactions  . Penicillins Other (See Comments)    Unknown reaction     Current Outpatient Prescriptions  Medication Sig Dispense Refill  . apixaban (ELIQUIS) 5 MG TABS tablet Take 1 tablet (5 mg total) by mouth 2 (two) times daily. 60 tablet 6  . atorvastatin (LIPITOR) 80 MG tablet Take 1 tablet (80 mg total) by mouth daily. 90 tablet 3  . Blood Glucose Monitoring Suppl (ONE TOUCH ULTRA MINI) w/Device KIT Use meter to check blood sugars 1-4 times daily as instructed. 1 each 0  . carvedilol (COREG) 3.125 MG tablet TAKE ONE TABLET BY MOUTH TWICE DAILY 60 tablet 7  . carvedilol (COREG) 6.25 MG tablet Take 1 tablet (6.25 mg total) by mouth 2 (two) times daily with a meal. 60 tablet 9  . furosemide (LASIX) 80 MG tablet TAKE ONE TABLET BY MOUTH ONCE DAILY 90 tablet 3  . gabapentin (NEURONTIN) 300 MG capsule Take 1 tablet in the morning and 2 tablets at bedtime 270 capsule 3  . glucose blood (ONE TOUCH ULTRA TEST) test strip Use one strip per test. Test blood sugars 1-4 times daily as instructed. 100 each 12  . insulin NPH-regular Human (NOVOLIN 70/30 RELION) (70-30) 100 UNIT/ML injection Inject 30 Units into the skin 2 (two) times daily with a meal. 10 mL 12  . Lancets Misc. (ONE TOUCH SURESOFT) MISC Use 1 lancet per test. Test blood sugars 1-4 times per day as instructed. 1 each 1  . losartan (COZAAR) 25 MG tablet  TAKE ONE TABLET BY MOUTH ONCE DAILY 30 tablet 10  . magnesium oxide (MAG-OX) 400 MG tablet Take 1 tablet (400 mg total) by mouth daily. 90 tablet 3  . metFORMIN (GLUCOPHAGE-XR) 750 MG 24 hr tablet TAKE TWO TABLETS BY MOUTH ONCE DAILY WITH BREAKFAST 60 tablet 1  . Multiple Vitamins-Minerals (MULTIVITAMIN PO) Take 1 capsule by mouth daily.     . sildenafil (REVATIO) 20 MG tablet Take 1-5 tablets (20-100 mg total) by mouth daily as needed. 50 tablet 0  . sotalol (BETAPACE) 120 MG tablet TAKE ONE TABLET BY MOUTH TWICE DAILY 180 tablet 3   No current facility-administered medications for this visit.      Past Medical History:  Diagnosis Date  . AICD (automatic cardioverter/defibrillator) present 02/05/2014   Upgrade to Medtronic biventricular ICD, serial number  [id number]   . Atrial flutter ([place]) 04/2012   s/p TEE-EPS+RFCA 04/2012  . CAD (coronary artery disease) 9179,1505 X 2    RCA-T, 70% PL (off CFX), 99% Prox LAD/90% Dist LAD, S/P TAXUS stent x 2  . CHF (congestive heart failure) ([place])   . Chronic anticoagulation   . Chronic systolic heart failure ([place])   . CKD (chronic kidney disease)   . DM type 2 (diabetes mellitus, type 2) ([facility])    insulin dependent  .  HTN (hypertension)   . Hypercholesteremia    ablation  . ICD (implantable cardiac defibrillator) in place   . Ischemic cardiomyopathy March 2015   20-25% 2D   . Nephrolithiasis   . Ventricular tachycardia ([place])     ROS:   All systems reviewed and negative except as noted in the HPI.   Past Surgical History:  Procedure Laterality Date  . ATRIAL FLUTTER ABLATION N/A 05/19/2012   Procedure: ATRIAL FLUTTER ABLATION;  Surgeon: Thompson Grayer, MD;  [facility] C[facility] LAB;  Service: Cardiovascular;  Laterality: N/A;  . BI-VENTRICULAR IMPLANTABLE CARDIOVERTER DEFIBRILLATOR UPGRADE N/A 02/05/2014   Procedure: BI-VENTRICULAR IMPLANTABLE CARDIOVERTER DEFIBRILLATOR UPGRADE;  Surgeon: Evans Lance, MD;  Location: [facility] CATH LAB;  Service:  Cardiovascular;  Laterality: N/A;  . BIV ICD GENERTAOR CHANGE OUT  02/05/2014   Upgrade to Medtronic biventricular ICD, serial number  [id number] by Dr. [name]  . CARDIAC DEFIBRILLATOR PLACEMENT  2007    Medtronic Maximo VR, serial number [id number]  . PERCUTANEOUS CORONARY STENT INTERVENTION (PCI-S)  January 2002   PTCA/Stent Distal RCA  . PERCUTANEOUS CORONARY STENT INTERVENTION (PCI-S)  June 2002   PTCA/Stent x 3 RCA, thrombolysis - failed  . PERCUTANEOUS CORONARY STENT INTERVENTION (PCI-S)  July 2006   TAXUS stents to prox and distal LAD     Family History  Problem Relation Age of Onset  . Heart failure Father     Deceased  . Alzheimer's disease Mother     Living  . Heart attack Mother   . CAD Brother   . CAD Brother   . CAD Brother   . CAD Brother   . Healthy Son   . Healthy Daughter   . Diabetes Brother      Social History   Social History  . Marital status: Married    Spouse name: N/A  . Number of children: 4  . Years of education: 14   Occupational History  . brick layer - currently unemployed    Social History Main Topics  . Smoking status: Former Smoker    Packs/day: 1.00    Years: 29.00    Types: Cigarettes    Quit date: 03/02/2000  . Smokeless tobacco: Never Used  . Alcohol use No  . Drug use: No  . Sexual activity: Not on file   Other Topics Concern  . Not on file   Social History Narrative   Pt lives with wife in a one story home - married for 29 years. He hunts regularly and is active, walking > 1 mile at times without difficulty.  Has 4 children.     Retired Horticulturist, commercial.  Education: some college.     BP 98/66   Pulse 62   Ht '5\' 10"'  (1.778 m)   Wt 234 lb 12.8 oz (106.5 kg)   BMI 33.69 kg/m   Physical Exam:  Well appearing 66 yo man, NAD HEENT: Unremarkable Neck:  6 cm JVD, no thyromegally Back:  No CVA tenderness Lungs:  Clear with no wheezes HEART:  Regular rate rhythm, no murmurs, no rubs, no clicks Abd:  soft, positive  bowel sounds, no organomegally, no rebound, no guarding Ext:  2 plus pulses, no edema, no cyanosis, no clubbing Skin:  No rashes no nodules Neuro:  CN II through XII intact, motor grossly intact  ECG - NSR with ventricular pacing  DEVICE  Normal device function.  See PaceArt for details. RV output remains subthreshold.  Assess/Plan: 1. PAF - he is maintaining  NSR 99.9% of the time. 2. VT - he has only had NSVT. No ICD therapies. He will continue sotalol. 3. Chronic systolic heart failure - I have asked him to increase lasix to 160 mg daily for 2 days then back to 120 alternating with 80 mg every other day. He has been eating lots of canned vegetables and I asked him to eat frozen or fresh. 4. ICD - His Medtronic BiV device was reprogrammed tightening up his AV delay.   [name] [name].D.

## 2016-03-25 NOTE — Patient Instructions (Addendum)
Medication Instructions:  Your physician has recommended you make the following change in your medication:  1) INCREASE Lasix to 160 mg (2 tablets) daily for 2 DAYS -- After that, take 1 tablet (80 mg) daily and ADD 1/2 tablet 40 mg (total dose 120 mg) every other day.  Labwork: None Ordered   Testing/Procedures: None Ordered   Follow-Up: Your physician recommends that you schedule a follow-up appointment in: 3 months with Dr. [name]  Your physician wants you to follow-up in: 1 year with Dr. [name]. You will receive a reminder letter in the mail two months in advance. If you don't receive a letter, please call our office to schedule the follow-up appointment.  Remote monitoring is used to monitor your ICD from home. This monitoring reduces the number of office visits required to check your device to one time per year. It allows [place] to keep an eye on the functioning of your device to ensure it is working properly. You are scheduled for a device check from home on 06/24/16. You may send your transmission at any time that day. If you have a wireless device, the transmission will be sent automatically. After your physician reviews your transmission, you will receive a postcard with your next transmission date.      Any Other Special Instructions Will Be Listed Below (If Applicable).     If you need a refill on your cardiac medications before your next appointment, please call your pharmacy.

## 2016-03-26 NOTE — Progress Notes (Signed)
Per Dr Tanna Furry office 03/25/2016, Lasix was increased to 160 mg daily for 2 days then back to 120 alternating with 80 mg every other day.  Per Dr [name] patient will be following up with him soon.  Recheck fluid levels on 04/02/2016.

## 2016-03-26 NOTE — Progress Notes (Signed)
Attempted call to patient and he returned call.  He confirmed he has increased Lasix as recommended.  Advised will recheck fluid levels next week and increased Lasix does not resolve weight gain and swelling of feet to call back.

## 2016-04-02 ENCOUNTER — Telehealth: Payer: Self-pay

## 2016-04-02 ENCOUNTER — Ambulatory Visit (INDEPENDENT_AMBULATORY_CARE_PROVIDER_SITE_OTHER): Payer: PPO

## 2016-04-02 ENCOUNTER — Other Ambulatory Visit: Payer: Self-pay | Admitting: *Deleted

## 2016-04-02 DIAGNOSIS — Z95810 Presence of automatic (implantable) cardiac defibrillator: Secondary | ICD-10-CM

## 2016-04-02 DIAGNOSIS — I509 Heart failure, unspecified: Secondary | ICD-10-CM

## 2016-04-02 DIAGNOSIS — I5022 Chronic systolic (congestive) heart failure: Secondary | ICD-10-CM

## 2016-04-02 NOTE — Telephone Encounter (Signed)
Left voice mail message requesting patient send ICM remote transmission to recheck fluid level.

## 2016-04-02 NOTE — Progress Notes (Addendum)
EPIC Encounter for ICM Monitoring  Patient Name: [name] [name] is a 66 y.o. male Date: 04/02/2016 Primary Care Physican: Mauricio Po, [place] Primary Cardiologist:Crenshaw Electrophysiologist: [name] Dry Weight:unknown Bi-V Pacing: 96.5%      Attempted call to patient and unable to reach.  Left message to return call regarding transmission.  Transmission reviewed.   Thoracic impedance almost back at normal after Dr [name] increased lasix to 160 mg daily for 2 days then back to 120 alternating with 80 mg every other day at last office visit 03/25/2016.  Recommendations:  NONE - Unable to reach patient   Follow-up plan: ICM clinic phone appointment on 04/24/2016.  Office appointment with Dr [name] 04/27/2016.  Copy of ICM check sent to primary cardiologist and device physician.   3 month ICM trend: 04/02/2016   1 Year ICM trend:      [name], RN 04/02/2016 10:40 AM

## 2016-04-06 DIAGNOSIS — I509 Heart failure, unspecified: Secondary | ICD-10-CM | POA: Diagnosis not present

## 2016-04-07 LAB — BASIC METABOLIC PANEL
BUN: 19 mg/dL (ref 7–25)
CHLORIDE: 102 mmol/L (ref 98–110)
CO2: 28 mmol/L (ref 20–31)
CREATININE: 0.92 mg/dL (ref 0.70–1.25)
Calcium: 9.4 mg/dL (ref 8.6–10.3)
GLUCOSE: 221 mg/dL — AB (ref 65–99)
POTASSIUM: 4.2 mmol/L (ref 3.5–5.3)
Sodium: 138 mmol/L (ref 135–146)

## 2016-04-10 DIAGNOSIS — E113411 Type 2 diabetes mellitus with severe nonproliferative diabetic retinopathy with macular edema, right eye: Secondary | ICD-10-CM | POA: Diagnosis not present

## 2016-04-15 NOTE — Progress Notes (Signed)
HPI: FU CAD (s/p prior PCI of RCA and LAD), ICM, systolic CHF, VTach s/p ICD Rx 04/2010 (Sotalol added to regimen), [name], HTN, HL, CKD. LHC 7/06: EF 35-40%, mLAD 99%, dLAD 90%, mid to dist CFX 70%, RCA occluded. PCI: DES to mid and dist LAD. Carotids 4/12: 0-39% bilat ICA. Previous atrial flutter ablation. Nuclear study October 2015 showed an ejection fraction of 30%. There was scar but no ischemia. Has had problems with acute on chronic systolic congestive heart failure. Has had upgrade of his ICD to a biventricular device. Echo 12/16 showed EF 20-25, restrictive fillling, RAE/RVE, LAE, mild TR. Pt noted to have PAF on his device and started on apixaban. Since he was last seen, the patient has dyspnea with more extreme activities but not with routine activities. It is relieved with rest. It is not associated with chest pain. There is no orthopnea, PND or pedal edema. There is no syncope or palpitations. There is no exertional chest pain.   Current Outpatient Prescriptions  Medication Sig Dispense Refill  . apixaban (ELIQUIS) 5 MG TABS tablet Take 1 tablet (5 mg total) by mouth 2 (two) times daily. 60 tablet 6  . atorvastatin (LIPITOR) 80 MG tablet Take 1 tablet (80 mg total) by mouth daily. 90 tablet 3  . Blood Glucose Monitoring Suppl (ONE TOUCH ULTRA MINI) w/Device KIT Use meter to check blood sugars 1-4 times daily as instructed. 1 each 0  . carvedilol (COREG) 3.125 MG tablet TAKE ONE TABLET BY MOUTH TWICE DAILY 60 tablet 7  . carvedilol (COREG) 6.25 MG tablet Take 1 tablet (6.25 mg total) by mouth 2 (two) times daily with a meal. 60 tablet 9  . furosemide (LASIX) 80 MG tablet Tablet (80 mg) daily and ADD 1/2 tablet 40 mg (total dose 120 mg) tablet every other day. 45 tablet 5  . gabapentin (NEURONTIN) 300 MG capsule Take 1 tablet in the morning and 2 tablets at bedtime 270 capsule 3  . glucose blood (ONE TOUCH ULTRA TEST) test strip Use one strip per test. Test blood sugars 1-4 times daily  as instructed. 100 each 12  . insulin NPH-regular Human (NOVOLIN 70/30 RELION) (70-30) 100 UNIT/ML injection Inject 30 Units into the skin 2 (two) times daily with a meal. 10 mL 12  . Lancets Misc. (ONE TOUCH SURESOFT) MISC Use 1 lancet per test. Test blood sugars 1-4 times per day as instructed. 1 each 1  . losartan (COZAAR) 25 MG tablet TAKE ONE TABLET BY MOUTH ONCE DAILY 30 tablet 10  . magnesium oxide (MAG-OX) 400 MG tablet Take 1 tablet (400 mg total) by mouth daily. 90 tablet 3  . metFORMIN (GLUCOPHAGE-XR) 750 MG 24 hr tablet TAKE TWO TABLETS BY MOUTH ONCE DAILY WITH BREAKFAST 60 tablet 1  . Multiple Vitamins-Minerals (MULTIVITAMIN PO) Take 1 capsule by mouth daily.     . sildenafil (REVATIO) 20 MG tablet Take 1-5 tablets (20-100 mg total) by mouth daily as needed. 50 tablet 0  . sotalol (BETAPACE) 120 MG tablet TAKE ONE TABLET BY MOUTH TWICE DAILY 180 tablet 3   No current facility-administered medications for this visit.      Past Medical History:  Diagnosis Date  . AICD (automatic cardioverter/defibrillator) present 02/05/2014   Upgrade to Medtronic biventricular ICD, serial number  [id number]   . Atrial flutter ([place]) 04/2012   s/p TEE-EPS+RFCA 04/2012  . CAD (coronary artery disease) 5732,2025 X 2    RCA-T, 70% PL (off CFX), 99%  Prox LAD/90% Dist LAD, S/P TAXUS stent x 2  . CHF (congestive heart failure) ([place])   . Chronic anticoagulation   . Chronic systolic heart failure ([place])   . CKD (chronic kidney disease)   . DM type 2 (diabetes mellitus, type 2) ([facility])    insulin dependent  . HTN (hypertension)   . Hypercholesteremia    ablation  . ICD (implantable cardiac defibrillator) in place   . Ischemic cardiomyopathy March 2015   20-25% 2D   . Nephrolithiasis   . Ventricular tachycardia [facility])     Past Surgical History:  Procedure Laterality Date  . ATRIAL FLUTTER ABLATION N/A 05/19/2012   Procedure: ATRIAL FLUTTER ABLATION;  Surgeon: Thompson Grayer, MD;  Location: [facility] CATH  LAB;  Service: Cardiovascular;  Laterality: N/A;  . BI-VENTRICULAR IMPLANTABLE CARDIOVERTER DEFIBRILLATOR UPGRADE N/A 02/05/2014   Procedure: BI-VENTRICULAR IMPLANTABLE CARDIOVERTER DEFIBRILLATOR UPGRADE;  Surgeon: Evans Lance, MD;  Location: [facility] CATH LAB;  Service: Cardiovascular;  Laterality: N/A;  . BIV ICD GENERTAOR CHANGE OUT  02/05/2014   Upgrade to Medtronic biventricular ICD, serial number  [id number] by Dr. [name]  . CARDIAC DEFIBRILLATOR PLACEMENT  2007    Medtronic Maximo VR, serial number [id number]  . PERCUTANEOUS CORONARY STENT INTERVENTION (PCI-S)  January 2002   PTCA/Stent Distal RCA  . PERCUTANEOUS CORONARY STENT INTERVENTION (PCI-S)  June 2002   PTCA/Stent x 3 RCA, thrombolysis - failed  . PERCUTANEOUS CORONARY STENT INTERVENTION (PCI-S)  July 2006   TAXUS stents to prox and distal LAD    Social History   Social History  . Marital status: Married    Spouse name: N/A  . Number of children: 4  . Years of education: 14   Occupational History  . brick layer - currently unemployed    Social History Main Topics  . Smoking status: Former Smoker    Packs/day: 1.00    Years: 29.00    Types: Cigarettes    Quit date: 03/02/2000  . Smokeless tobacco: Never Used  . Alcohol use No  . Drug use: No  . Sexual activity: Not on file   Other Topics Concern  . Not on file   Social History Narrative   Pt lives with wife in a one story home - married for 57 years. He hunts regularly and is active, walking > 1 mile at times without difficulty.  Has 4 children.     Retired Horticulturist, commercial.  Education: some college.    Family History  Problem Relation Age of Onset  . Heart failure Father     Deceased  . Alzheimer's disease Mother     Living  . Heart attack Mother   . CAD Brother   . CAD Brother   . CAD Brother   . CAD Brother   . Healthy Son   . Healthy Daughter   . Diabetes Brother     ROS: no fevers or chills, productive cough, hemoptysis, dysphasia, odynophagia,  melena, hematochezia, dysuria, hematuria, rash, seizure activity, orthopnea, PND, pedal edema, claudication. Remaining systems are negative.  Physical Exam: Well-developed well-nourished in no acute distress.  Skin is warm and dry.  HEENT is normal.  Neck is supple. No bruits Chest is clear to auscultation with normal expansion.  Cardiovascular exam is regular rate and rhythm.  Abdominal exam nontender or distended. No masses palpated. Extremities show no edema. neuro grossly intact   A/P  1 Paroxysmal atrial fibrillation-patient in sinus rhythm today on exam. Continue apixaban. Check hemoglobin. Continue beta  blocker.  2 coronary artery disease-continue statin. No aspirin given need for anticoagulation.  3 ischemic cardiomyopathy-continue ARB. I do not think his blood pressure will tolerate entresto. Continue coreg. Spironolactone caused worsening renal function previously.  4 chronic systolic congestive heart failure-he appears to be euvolemic. Continue present dose of Lasix. Take an additional 40 mg daily for weight gain of 2 pounds. We discussed importance of fluid restriction and low sodium diet.   5 status post biventricular ICD-followed by electrophysiology.  6 hyperlipidemia-continue statin. Check lipids and liver.  7 ventricular tachycardia-continue sotalol.  8 hypertension-blood pressure controlled. Continue present medications.  Kirk Ruths, MD

## 2016-04-24 ENCOUNTER — Telehealth: Payer: Self-pay | Admitting: Cardiology

## 2016-04-24 ENCOUNTER — Ambulatory Visit (INDEPENDENT_AMBULATORY_CARE_PROVIDER_SITE_OTHER): Payer: PPO

## 2016-04-24 DIAGNOSIS — I5022 Chronic systolic (congestive) heart failure: Secondary | ICD-10-CM | POA: Diagnosis not present

## 2016-04-24 DIAGNOSIS — Z95810 Presence of automatic (implantable) cardiac defibrillator: Secondary | ICD-10-CM

## 2016-04-24 NOTE — Telephone Encounter (Signed)
LMOVM reminding pt to send remote transmission.   

## 2016-04-27 ENCOUNTER — Encounter: Payer: Self-pay | Admitting: Cardiology

## 2016-04-27 ENCOUNTER — Ambulatory Visit (INDEPENDENT_AMBULATORY_CARE_PROVIDER_SITE_OTHER): Payer: PPO | Admitting: Cardiology

## 2016-04-27 VITALS — BP 94/66 | HR 58 | Ht 70.0 in | Wt 245.0 lb

## 2016-04-27 DIAGNOSIS — I2510 Atherosclerotic heart disease of native coronary artery without angina pectoris: Secondary | ICD-10-CM | POA: Diagnosis not present

## 2016-04-27 DIAGNOSIS — E7800 Pure hypercholesterolemia, unspecified: Secondary | ICD-10-CM

## 2016-04-27 DIAGNOSIS — I10 Essential (primary) hypertension: Secondary | ICD-10-CM | POA: Diagnosis not present

## 2016-04-27 LAB — LIPID PANEL
Cholesterol: 116 mg/dL (ref <200)
HDL: 31 mg/dL — AB (ref >40)
LDL Cholesterol: 74 mg/dL (ref <100)
TRIGLYCERIDES: 57 mg/dL (ref <150)
Total CHOL/HDL Ratio: 3.7 Ratio (ref <5.0)
VLDL: 11 mg/dL (ref <30)

## 2016-04-27 LAB — CBC
HEMATOCRIT: 43 % (ref 38.5–50.0)
Hemoglobin: 13.4 g/dL (ref 13.2–17.1)
MCH: 27.3 pg (ref 27.0–33.0)
MCHC: 31.2 g/dL — ABNORMAL LOW (ref 32.0–36.0)
MCV: 87.6 fL (ref 80.0–100.0)
MPV: 11.2 fL (ref 7.5–12.5)
Platelets: 170 10*3/uL (ref 140–400)
RBC: 4.91 MIL/uL (ref 4.20–5.80)
RDW: 14.8 % (ref 11.0–15.0)
WBC: 4.7 10*3/uL (ref 3.8–10.8)

## 2016-04-27 LAB — HEPATIC FUNCTION PANEL
ALK PHOS: 101 U/L (ref 40–115)
ALT: 17 U/L (ref 9–46)
AST: 20 U/L (ref 10–35)
Albumin: 3.7 g/dL (ref 3.6–5.1)
BILIRUBIN INDIRECT: 0.7 mg/dL (ref 0.2–1.2)
Bilirubin, Direct: 0.2 mg/dL (ref <=0.2)
TOTAL PROTEIN: 8.1 g/dL (ref 6.1–8.1)
Total Bilirubin: 0.9 mg/dL (ref 0.2–1.2)

## 2016-04-27 NOTE — Progress Notes (Signed)
EPIC Encounter for ICM Monitoring  Patient Name: [name] [name] is a 66 y.o. male Date: 04/27/2016 Primary Care Physican: Mauricio Po, [place] Primary Cardiologist:Crenshaw Electrophysiologist: [name] Dry Weight:unknown Bi-V Pacing: 96.5%       Attempted call to patient and unable to reach.  Left detailed message regarding transmission.  Transmission reviewed.    Thoracic impedance normal since 04/10/2016 but was abnormal suggesting fluid accumulation 01/28/2016 to 04/09/2016.  Prescribed: Furosemide 80 mg daily and add 1/2 tablet (40 mg total) every other day  Recommendations: Left voice mail with ICM number and encouraged to call for fluid symptoms.  Follow-up plan: ICM clinic phone appointment on 05/28/2016.   Office appointment with Dr [name] today, 04/27/2016.   Copy of ICM check sent to primary cardiologist and device physician.   3 month ICM trend: 04/25/2016   1 Year ICM trend:      [name], RN 04/27/2016 8:47 AM

## 2016-04-27 NOTE — Patient Instructions (Signed)

## 2016-04-29 ENCOUNTER — Encounter: Payer: Self-pay | Admitting: *Deleted

## 2016-05-08 DIAGNOSIS — E113312 Type 2 diabetes mellitus with moderate nonproliferative diabetic retinopathy with macular edema, left eye: Secondary | ICD-10-CM | POA: Diagnosis not present

## 2016-05-08 DIAGNOSIS — E113511 Type 2 diabetes mellitus with proliferative diabetic retinopathy with macular edema, right eye: Secondary | ICD-10-CM | POA: Diagnosis not present

## 2016-05-11 ENCOUNTER — Other Ambulatory Visit: Payer: Self-pay | Admitting: Cardiology

## 2016-05-26 ENCOUNTER — Telehealth: Payer: Self-pay | Admitting: Neurology

## 2016-05-26 ENCOUNTER — Telehealth: Payer: Self-pay | Admitting: Cardiology

## 2016-05-26 NOTE — Telephone Encounter (Signed)
Spoke with pt dtr, she is concerned because on Saturday the patient had a lot of swelling in his feet. She also states his feet and legs were turning dark. The swelling got better after he put on the compression hose. They wanted to know about a referral to endocrinologist in regards to the swelling. His blood surgar is currently being managed by his PCP. dtr advised me to talk to the pats wife. Spoke with pt wife, she reports the patient is having a lot of pain in his feet and legs due to neuropathy. His weight is down by her report and he does not seem to have any SOB. She reports the patient does not sleep well but she does not know why. Left message for patient to call me to discuss. Will forward to dr [name], ? Okay to refer to endocrinologist?

## 2016-05-26 NOTE — Telephone Encounter (Signed)
[name] [name] called in regards to PT and said he needs a referral to endocrinology/Dawn CB# [phone]

## 2016-05-26 NOTE — Telephone Encounter (Signed)
Patient calling back - I informed him the request for referral was sent to Dr. [name]. Patient states he thought Hilda Blades might need to ask him questions. Please call if able.

## 2016-05-26 NOTE — Telephone Encounter (Signed)
Spoke with pt, he is not having any SOB or orthopnea. His weight is down to 215 lb. He is also concerned about the swelling he has. He would like to be seen to make sure his heart is doing okay. Reassurance given to the patient that is sounded like he was stable. Follow up scheduled with APP per patient request.

## 2016-05-26 NOTE — Telephone Encounter (Signed)
New message      Family want [place] to refer pt to an endocrinologist because of swelling in his feet.  Pt is a diabetic.  They spoke with the office at [facility] and they said we need to put the referral in epic.  Please call daughter and let her know if Dr [name] will refer him to the endocrinologist

## 2016-05-26 NOTE — Telephone Encounter (Signed)
New Message ° ° pt verbalized that he is returning call for rn  °

## 2016-05-26 NOTE — Telephone Encounter (Signed)
F/U call:  Patient states he is returning a missed call from our office, thanks.

## 2016-05-26 NOTE — Telephone Encounter (Signed)
Ok to refer to endocrinology; make sure pt not having CHF symptoms [name] [name]

## 2016-05-27 NOTE — Telephone Encounter (Signed)
Ok to refer.

## 2016-05-27 NOTE — Telephone Encounter (Signed)
It looks like his PCP is managing diabetes and I will defer to them whether he needs to see endocrinology.  [name] [name] K. Posey Pronto, DO

## 2016-05-28 ENCOUNTER — Telehealth: Payer: Self-pay | Admitting: Cardiology

## 2016-05-28 ENCOUNTER — Ambulatory Visit (INDEPENDENT_AMBULATORY_CARE_PROVIDER_SITE_OTHER): Payer: PPO

## 2016-05-28 DIAGNOSIS — I5022 Chronic systolic (congestive) heart failure: Secondary | ICD-10-CM

## 2016-05-28 DIAGNOSIS — Z95810 Presence of automatic (implantable) cardiac defibrillator: Secondary | ICD-10-CM | POA: Diagnosis not present

## 2016-05-28 NOTE — Telephone Encounter (Signed)
Spoke with pt and reminded pt of remote transmission that is due today. Pt verbalized understanding.   

## 2016-05-28 NOTE — Progress Notes (Signed)
EPIC Encounter for ICM Monitoring  Patient Name: [name] [name] is a 66 y.o. male Date: 05/28/2016 Primary Care Physican: Mauricio Po, [place] Primary Cardiologist:Crenshaw Electrophysiologist: [name] Dry Weight:unknown Bi-V Pacing:  99.3%       Attempted call to patient and unable to reach.  Left detailed message regarding transmission.  Transmission reviewed.    Thoracic impedance normal.  Prescribed: Furosemide 80 mg daily and add 1/2 tablet (40 mg total) every other day  Recommendations: NONE - Unable to reach patient   Follow-up plan: ICM clinic phone appointment on 06/29/2016.  Copy of ICM check sent to device physician.   3 month ICM trend: 05/28/2016   1 Year ICM trend:      [name], RN 05/28/2016 5:07 PM

## 2016-05-29 NOTE — Telephone Encounter (Signed)
Please have patient schedule an office visit. As he has not been seen since October 2017.

## 2016-05-31 NOTE — Progress Notes (Signed)
Cardiology Office Note    Date:  06/02/2016   ID:  [name] [name], DOB 08/05/1950, MRN [id number]  PCP:  Mauricio Po, FNP  Cardiologist: Dr. [name]   Chief Complaint  Patient presents with  . Leg Swelling    History of Present Illness:    [name] [name] is a 66 y.o. male with past medical history of CAD (s/p prior PCI of RCA and LAD), chronic systolic CHF (EF 95-62% by echo in 01/2015), ischemic cardiomyopathy (s/p placement of biventricular ICD in 2015), prior VT (on Sotalol) Type 2 DM, HTN, HLD, and PAF (on Eliquis) who presents to the office today for follow-up of his lower extremity edema.   He was last seen by Dr. [name] in 04/2016 and reported episodes of dyspnea with exertion and no recent chest pain or palpitations. He was continued on PO Lasix 79m daily with instructions to take an additional 475mif needed for weight gain. Weight was 245 lbs at that time.   In talking with the patient and his daughter today, he reports significant improvement in his lower extremity edema since his last office visit. Weight is down to 223 pounds. Reports weights are usually variable between 216-219 lbs on his home scales.  He has been following the SoLafeith his wife and feels like this is helping with his weight loss. He denies any recent chest pain, palpitations, orthopnea, PND, or presyncope.   He does report pain from his mid-shin to his toes bilaterally which has been present for several months and has worsened in the past several weeks. He denies any known non-healing wounds or ulcers. Has been applying multiple OTC creams with no improvement. The pain is present "most of the time" but is worse with activity. 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 His Lyrica dosing was recently increased with no improvement in his symptoms.    Past Medical History:  Diagnosis Date  . AICD (automatic cardioverter/defibrillator) present  02/05/2014   Upgrade to Medtronic biventricular ICD, serial number  [id number]   . [place]   s/p TEE-EPS+RFCA 04/2012  . CAD (coronary artery disease) 201308,6578 2    RCA-T, 70% PL (off CFX), 99% Prox LAD/90% Dist LAD, S/P TAXUS stent x 2  . CH[place] (congestive heart failure) ([place]  . Chronic anticoagulation   . [place] heart failure ([place]  . CKD (chronic kidney disease)   . DM type 2 (diabetes mellitus, type 2) (HCC)    insulin dependent  . HTN (hypertension)   . Hypercholesteremia    ablation  . ICD (implantable cardiac defibrillator) in place   . Ischemic cardiomyopathy March 2015   20-25% 2D   . Nephrolithiasis   . [facility]    Past Surgical History:  Procedure Laterality Date  . ATRIAL FLUTTER ABLATION N/A 05/19/2012   Procedure: ATRIAL FLUTTER ABLATION;  Surgeon: JaThompson GrayerMD;  [facility];  Service: Cardiovascular;  Laterality: N/A;  . BI-VENTRICULAR IMPLANTABLE CARDIOVERTER DEFIBRILLATOR UPGRADE N/A 02/05/2014   Procedure: BI-VENTRICULAR IMPLANTABLE CARDIOVERTER DEFIBRILLATOR UPGRADE;  Surgeon: GrEvans LanceMD;  [facility];  Service: Cardiovascular;  Laterality: N/A;  . BIV ICD GENERTAOR CHANGE OUT  02/05/2014   Upgrade to Medtronic biventricular ICD, serial number  [id number] by Dr. TaLovena Le. CARDIAC DEFIBRILLATOR PLACEMENT  2007    Medtronic Maximo VR, serial number [id number]  .  PERCUTANEOUS CORONARY STENT INTERVENTION (PCI-S)  January 2002   PTCA/Stent Distal RCA  . PERCUTANEOUS CORONARY STENT INTERVENTION (PCI-S)  June 2002   PTCA/Stent x 3 RCA, thrombolysis - failed  . PERCUTANEOUS CORONARY STENT INTERVENTION (PCI-S)  July 2006   TAXUS stents to prox and distal LAD    Current Medications: Outpatient Medications Prior to Visit  Medication Sig Dispense Refill  . apixaban (ELIQUIS) 5 MG TABS tablet Take 1 tablet (5 mg total) by mouth 2 (two) times daily. 60 tablet 6  . atorvastatin  (LIPITOR) 80 MG tablet Take 1 tablet (80 mg total) by mouth daily. 90 tablet 3  . furosemide (LASIX) 80 MG tablet Tablet (80 mg) daily and ADD 1/2 tablet 40 mg (total dose 120 mg) tablet every other day. 45 tablet 5  . gabapentin (NEURONTIN) 300 MG capsule Take 1 tablet in the morning and 2 tablets at bedtime 270 capsule 3  . insulin NPH-regular Human (NOVOLIN 70/30 RELION) (70-30) 100 UNIT/ML injection Inject 30 Units into the skin 2 (two) times daily with a meal. 10 mL 12  . losartan (COZAAR) 25 MG tablet Take 1 tablet (25 mg total) by mouth daily. 90 tablet 3  . magnesium oxide (MAG-OX) 400 MG tablet Take 1 tablet (400 mg total) by mouth daily. 90 tablet 3  . metFORMIN (GLUCOPHAGE-XR) 750 MG 24 hr tablet TAKE TWO TABLETS BY MOUTH ONCE DAILY WITH BREAKFAST 60 tablet 1  . Multiple Vitamins-Minerals (MULTIVITAMIN PO) Take 1 capsule by mouth daily.     . sildenafil (REVATIO) 20 MG tablet Take 1-5 tablets (20-100 mg total) by mouth daily as needed. 50 tablet 0  . sotalol (BETAPACE) 120 MG tablet TAKE ONE TABLET BY MOUTH TWICE DAILY 180 tablet 3  . Blood Glucose Monitoring Suppl (ONE TOUCH ULTRA MINI) w/Device KIT Use meter to check blood sugars 1-4 times daily as instructed. 1 each 0  . carvedilol (COREG) 6.25 MG tablet Take 1 tablet (6.25 mg total) by mouth 2 (two) times daily with a meal. 60 tablet 9  . glucose blood (ONE TOUCH ULTRA TEST) test strip Use one strip per test. Test blood sugars 1-4 times daily as instructed. 100 each 12  . Lancets Misc. (ONE TOUCH SURESOFT) MISC Use 1 lancet per test. Test blood sugars 1-4 times per day as instructed. 1 each 1  . carvedilol (COREG) 3.125 MG tablet TAKE ONE TABLET BY MOUTH TWICE DAILY 60 tablet 7  . ELIQUIS 5 MG TABS tablet TAKE ONE TABLET BY MOUTH TWICE DAILY 60 tablet 6   No facility-administered medications prior to visit.      Allergies:   Penicillins   Social History   Social History  . Marital status: Married    Spouse name: N/A  .  Number of children: 4  . Years of education: 14   Occupational History  . brick layer - currently unemployed    Social History Main Topics  . Smoking status: Former Smoker    Packs/day: 1.00    Years: 29.00    Types: Cigarettes    Quit date: 03/02/2000  . Smokeless tobacco: Never Used  . Alcohol use No  . Drug use: No  . Sexual activity: Not Asked   Other Topics Concern  . None   Social History Narrative   Pt lives with wife in a one story home - married for 31 years. He hunts regularly and is active, walking > 1 mile at times without difficulty.  Has 4 children.  Retired Horticulturist, commercial.  Education: some college.     Family History:  The patient's family history includes Alzheimer's disease in his mother; CAD in his brother, brother, brother, and brother; Diabetes in his brother; Healthy in his daughter and son; Heart attack in his mother; Heart failure in his father.   Review of Systems:   Please see the history of present illness.     General:  No chills, fever, night sweats or weight changes.  Cardiovascular:  No chest pain, dyspnea on exertion, orthopnea, palpitations, paroxysmal nocturnal dyspnea. Positive for lower extremity edema and claudication.  Dermatological: No rash, lesions/masses Respiratory: No cough, dyspnea Urologic: No hematuria, dysuria Abdominal:   No nausea, vomiting, diarrhea, bright red blood per rectum, melena, or hematemesis Neurologic:  No visual changes, wkns, changes in mental status. All other systems reviewed and are otherwise negative except as noted above.   Physical Exam:    VS:  BP 96/68   Pulse (!) 59   Ht '5\' 10"'  (1.778 m)   Wt 223 lb (101.2 kg)   BMI 32.00 kg/m    General: Well developed, well nourished [place] American male appearing in no acute distress. Head: Normocephalic, atraumatic, sclera non-icteric, no xanthomas, nares are without discharge.  Neck: No carotid bruits. JVD not elevated.  Lungs: Respirations regular and  unlabored, without wheezes or rales.  Heart: Regular rate and rhythm. No S3 or S4.  No murmur, no rubs, or gallops appreciated. Abdomen: Soft, non-tender, non-distended with normoactive bowel sounds. No hepatomegaly. No rebound/guarding. No obvious abdominal masses. Msk:  Strength and tone appear normal for age. No joint deformities or effusions. Extremities: No clubbing or cyanosis. Trace lower extremity edema.  Distal pedal pulses are 1+ bilaterally. Neuro: Alert and oriented X 3. Moves all extremities spontaneously. No focal deficits noted. Psych:  Responds to questions appropriately with a normal affect. Skin: No rashes or lesions noted  Wt Readings from Last 3 Encounters:  06/02/16 223 lb (101.2 kg)  04/27/16 245 lb (111.1 kg)  03/25/16 234 lb 12.8 oz (106.5 kg)     Studies/Labs Reviewed:   EKG:  EKG is not ordered today.   Recent Labs: 04/06/2016: BUN 19; Creat 0.92; Potassium 4.2; Sodium 138 04/27/2016: ALT 17; Hemoglobin 13.4; Platelets 170   Lipid Panel    Component Value Date/Time   CHOL 116 04/27/2016 1331   TRIG 57 04/27/2016 1331   HDL 31 (L) 04/27/2016 1331   CHOLHDL 3.7 04/27/2016 1331   VLDL 11 04/27/2016 1331   LDLCALC 74 04/27/2016 1331   LDLDIRECT 197.0 07/10/2011 0957    Additional studies/ records that were reviewed today include:   Echocardiogram: 01/2015 Study Conclusions  - Left ventricle: There is akinesis of the ientire inferior, basal   inferoseptal, mid inferolateral and apical septal walls. The   cavity size was moderately dilated. Systolic function was   severely reduced. The estimated ejection fraction was in the   range of 20% to 25%. Wall motion was normal; there were no   regional wall motion abnormalities. Doppler parameters are   consistent with restrictive physiology, indicative of decreased   left ventricular diastolic compliance and/or increased left   atrial pressure. Doppler parameters are consistent with elevated   ventricular  end-diastolic filling pressure. - Aortic valve: Trileaflet; mildly thickened, mildly calcified   leaflets. - Aortic root: The aortic root was normal in size. - Ascending aorta: The ascending aorta was normal in size. - Mitral valve: Structurally normal valve. - Left atrium: The  atrium was moderately dilated. - Right ventricle: The cavity size was moderately dilated. Wall   thickness was normal. Pacer wire or catheter noted in right   ventricle. Systolic function was moderately to severely reduced. - Right atrium: The atrium was mildly dilated. Pacer wire or   catheter noted in right atrium. - Tricuspid valve: There was mild regurgitation. - Pulmonary arteries: Systolic pressure was mildly increased. PA   peak pressure: 34 mm Hg (S). - Inferior vena cava: The vessel was normal in size. - Pericardium, extracardiac: There was no pericardial effusion.  Impressions:  - There is no significant difference from the prior study on   05/05/2013. Biventricular function remains severely impaired.   Assessment:    1. Lower extremity edema   2. Claudication ([place])   3. Chronic systolic heart failure (HCC)   4. Cardiomyopathy, ischemic   5. Coronary artery disease involving native coronary artery of native heart without angina pectoris   6. Essential hypertension   7. PAF (paroxysmal atrial fibrillation) ([place])   8. Chronic renal disease, stage III   9. Medication management      Plan:   In order of problems listed above:  1. Lower Extremity Edema/ Claudication - PO Lasix dosing recently increased to 27m daily with an additional 464mevery other day. He continues to have trace lower extremity edema on examination which has significantly improved since his last office visit. He does have worsening pain along his lower extremities which is worse with activity.  - continue with Lasix at current dosing. Recheck BMET today to assess K+ and kidney function. Will check lower extremity dopplers  and ABI's to rule-out significant PAD.   2. Chronic Systolic CHF/ Ischemic Cardiomyopathy - EF 20-25% by echo in 01/2015, s/p placement of biventricular ICD in 2015. - has trace edema but lungs are clear on examination and JVD is normal.  - weight down 22 lbs since last office visit (245 lbs --> 223 lbs). Likely a combination of diuresis along with dietary changes. - continue with PO Lasix, Coreg, and Losartan. Unable to add Spironolactone or Entresto with borderline hypotension.   3. CAD - prior PCI of RCA and LAD.  - he denies any recent anginal symptoms.  - continue statin and BB therapy. No ASA secondary to need for Eliquis.   4. Essential HTN - BP soft at 96/68 during today's visit. Patient reports this is "normal" for him.  - continue current medication regimen.   5. Paroxysmal Atrial Fibrillation - he denies any recent palpitations. Continue Eliquis for anticoagulation and Coreg for rate-control.   6. Stage 3 CKD - baseline creatinine 1.1 - 1.2. Improved to 0.92 on most recent check.    Medication Adjustments/Labs and Tests Ordered: Current medicines are reviewed at length with the patient today.  Concerns regarding medicines are outlined above.  Medication changes, Labs and Tests ordered today are listed in the Patient Instructions below. Patient Instructions  Medication Instructions: Your physician recommends that you continue on your current medications as directed. Please refer to the Current Medication list given to you today.  Labwork: Your physician recommends that you return for lab work: BMET  Testing/Procedures: Your physician has requested that you have a Lower Extremity Arterial Duplex. During this test, ultrasound is used to evaluate arterial blood flow in the legs. Allow one hour for this exam. There are no restrictions or special instructions.  Your physician has requested that you have an ankle brachial index (ABI). During this test an ultrasound  and blood  pressure cuff are used to evaluate the arteries that supply the arms and legs with blood. Allow thirty minutes for this exam. There are no restrictions or special instructions.  Follow-Up: Your physician recommends that you schedule a follow-up appointment in August 2018 with Dr. [name].  If you need a refill on your cardiac medications before your next appointment, please call your pharmacy.   Signed, Erma Heritage, PA  06/02/2016 [address], [place], [place]  [address] Phone: [phone]; Fax: [phone]  [address] [place], [place] [address] Phone: [phone]

## 2016-06-01 ENCOUNTER — Other Ambulatory Visit: Payer: Self-pay

## 2016-06-01 ENCOUNTER — Other Ambulatory Visit: Payer: Self-pay | Admitting: *Deleted

## 2016-06-01 DIAGNOSIS — E0842 Diabetes mellitus due to underlying condition with diabetic polyneuropathy: Secondary | ICD-10-CM

## 2016-06-01 MED ORDER — LOSARTAN POTASSIUM 25 MG PO TABS: 25.0000 mg | ORAL_TABLET | Freq: Every day | ORAL | 3 refills | Status: DC

## 2016-06-01 NOTE — Telephone Encounter (Signed)
Rx(s) sent to pharmacy electronically.  

## 2016-06-01 NOTE — Telephone Encounter (Signed)
Scheduled pt with [name] [name].

## 2016-06-02 ENCOUNTER — Encounter: Payer: Self-pay | Admitting: Student

## 2016-06-02 ENCOUNTER — Ambulatory Visit (INDEPENDENT_AMBULATORY_CARE_PROVIDER_SITE_OTHER): Payer: PPO | Admitting: Student

## 2016-06-02 VITALS — BP 96/68 | HR 59 | Ht 70.0 in | Wt 223.0 lb

## 2016-06-02 DIAGNOSIS — Z79899 Other long term (current) drug therapy: Secondary | ICD-10-CM

## 2016-06-02 DIAGNOSIS — R600 Localized edema: Secondary | ICD-10-CM

## 2016-06-02 DIAGNOSIS — I10 Essential (primary) hypertension: Secondary | ICD-10-CM | POA: Diagnosis not present

## 2016-06-02 DIAGNOSIS — I255 Ischemic cardiomyopathy: Secondary | ICD-10-CM

## 2016-06-02 DIAGNOSIS — I5022 Chronic systolic (congestive) heart failure: Secondary | ICD-10-CM

## 2016-06-02 DIAGNOSIS — N183 Chronic kidney disease, stage 3 unspecified: Secondary | ICD-10-CM

## 2016-06-02 DIAGNOSIS — I2510 Atherosclerotic heart disease of native coronary artery without angina pectoris: Secondary | ICD-10-CM | POA: Diagnosis not present

## 2016-06-02 DIAGNOSIS — I739 Peripheral vascular disease, unspecified: Secondary | ICD-10-CM | POA: Diagnosis not present

## 2016-06-02 DIAGNOSIS — I480 Paroxysmal atrial fibrillation: Secondary | ICD-10-CM

## 2016-06-02 LAB — BASIC METABOLIC PANEL WITH GFR
BUN: 18 mg/dL (ref 7–25)
CHLORIDE: 102 mmol/L (ref 98–110)
CO2: 23 mmol/L (ref 20–31)
CREATININE: 1.16 mg/dL (ref 0.70–1.25)
Calcium: 9.3 mg/dL (ref 8.6–10.3)
GFR, EST AFRICAN AMERICAN: 75 mL/min (ref >=60)
GFR, Est Non African American: 65 mL/min (ref >=60)
Glucose, Bld: 177 mg/dL — ABNORMAL HIGH (ref 65–99)
Potassium: 4.7 mmol/L (ref 3.5–5.3)
Sodium: 137 mmol/L (ref 135–146)

## 2016-06-02 MED ORDER — CARVEDILOL 6.25 MG PO TABS: 6.2500 mg | ORAL_TABLET | Freq: Two times a day (BID) | ORAL | 3 refills | Status: DC

## 2016-06-02 NOTE — Patient Instructions (Signed)
Medication Instructions: Your physician recommends that you continue on your current medications as directed. Please refer to the Current Medication list given to you today.  Labwork: Your physician recommends that you return for lab work: BMET  Testing/Procedures: Your physician has requested that you have a Lower Extremity Arterial Duplex. During this test, ultrasound is used to evaluate arterial blood flow in the legs. Allow one hour for this exam. There are no restrictions or special instructions.  Your physician has requested that you have an ankle brachial index (ABI). During this test an ultrasound and blood pressure cuff are used to evaluate the arteries that supply the arms and legs with blood. Allow thirty minutes for this exam. There are no restrictions or special instructions.  Follow-Up: Your physician recommends that you schedule a follow-up appointment in August 2018 with Dr. [name].  If you need a refill on your cardiac medications before your next appointment, please call your pharmacy.

## 2016-06-03 ENCOUNTER — Telehealth: Payer: Self-pay | Admitting: Student

## 2016-06-03 NOTE — Telephone Encounter (Signed)
-----   Message from Erma Heritage, [name] sent at 06/03/2016  8:44 AM EDT ----- Please let the patient know his K+ and kidney function remain stable. Continue with current medication regimen as discussed yesterday. Thank you.

## 2016-06-03 NOTE — Telephone Encounter (Signed)
Follow Up: ° ° °Returning your call,concerning his lab results. °

## 2016-06-11 ENCOUNTER — Ambulatory Visit (INDEPENDENT_AMBULATORY_CARE_PROVIDER_SITE_OTHER): Payer: PPO | Admitting: Family

## 2016-06-11 ENCOUNTER — Encounter: Payer: Self-pay | Admitting: Family

## 2016-06-11 VITALS — BP 98/56 | HR 60 | Temp 97.8°F | Resp 16 | Ht 70.0 in | Wt 223.4 lb

## 2016-06-11 DIAGNOSIS — E1121 Type 2 diabetes mellitus with diabetic nephropathy: Secondary | ICD-10-CM

## 2016-06-11 LAB — POCT GLYCOSYLATED HEMOGLOBIN (HGB A1C): HEMOGLOBIN A1C: 11.9

## 2016-06-11 MED ORDER — DULAGLUTIDE 0.75 MG/0.5ML ~~LOC~~ SOAJ: 0.7500 mg | SUBCUTANEOUS | 0 refills | Status: DC

## 2016-06-11 NOTE — Progress Notes (Signed)
Subjective:    Patient ID: [name] [name], male    DOB: 1951-01-21, 66 y.o.   MRN: [id number]  Chief Complaint  Patient presents with  . Follow-up    needs diabetes check, states he is having major pains below his knee in both legs    HPI:  [name] [name] is a 66 y.o. male who  has a past medical history of AICD (automatic cardioverter/defibrillator) present (02/05/2014); Atrial flutter ([place]) (04/2012); CAD (coronary artery disease) (6237,6283 X 2 ); CHF (congestive heart failure) ([place]); Chronic anticoagulation; Chronic systolic heart failure (HCC); CKD (chronic kidney disease); DM type 2 (diabetes mellitus, type 2) ([place]); HTN (hypertension); Hypercholesteremia; ICD (implantable cardiac defibrillator) in place; Ischemic cardiomyopathy (March 2015); Nephrolithiasis; and Ventricular tachycardia ([place]). and presents today for a follow up.  1.) Diabetes - Currently maintained on Relion 70/30 with 30 units twice daily. Blood sugars at home have been averaging about 140-230 which vary on a regular basis. Indicates that he watches what he eats with his intake and working to decrease his sugars. 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 He is currently taking 1500 mg daily of the gabapentin which has not helped with his symptoms very much. Walking on occasion with no structured exercise. Reports that he completed his diabetic eye exam.   Lab Results  Component Value Date   HGBA1C 11.9 06/11/2016     Allergies  Allergen Reactions  . Penicillins Other (See Comments)    Unknown reaction      Outpatient Medications Prior to Visit  Medication Sig Dispense Refill  . apixaban (ELIQUIS) 5 MG TABS tablet Take 1 tablet (5 mg total) by mouth 2 (two) times daily. 60 tablet 6  . atorvastatin (LIPITOR) 80 MG tablet Take 1 tablet (80 mg total) by mouth daily. 90 tablet 3  . carvedilol (COREG) 6.25 MG tablet Take 1 tablet (6.25 mg total) by mouth 2  (two) times daily with a meal. 180 tablet 3  . furosemide (LASIX) 80 MG tablet Tablet (80 mg) daily and ADD 1/2 tablet 40 mg (total dose 120 mg) tablet every other day. 45 tablet 5  . gabapentin (NEURONTIN) 300 MG capsule Take 1 tablet in the morning and 2 tablets at bedtime 270 capsule 3  . insulin NPH-regular Human (NOVOLIN 70/30 RELION) (70-30) 100 UNIT/ML injection Inject 30 Units into the skin 2 (two) times daily with a meal. 10 mL 12  . losartan (COZAAR) 25 MG tablet Take 1 tablet (25 mg total) by mouth daily. 90 tablet 3  . magnesium oxide (MAG-OX) 400 MG tablet Take 1 tablet (400 mg total) by mouth daily. 90 tablet 3  . metFORMIN (GLUCOPHAGE-XR) 750 MG 24 hr tablet TAKE TWO TABLETS BY MOUTH ONCE DAILY WITH BREAKFAST 60 tablet 1  . Multiple Vitamins-Minerals (MULTIVITAMIN PO) Take 1 capsule by mouth daily.     . sildenafil (REVATIO) 20 MG tablet Take 1-5 tablets (20-100 mg total) by mouth daily as needed. 50 tablet 0  . sotalol (BETAPACE) 120 MG tablet TAKE ONE TABLET BY MOUTH TWICE DAILY 180 tablet 3   No facility-administered medications prior to visit.      Review of Systems  Eyes:       Negative for changes in vision.  Respiratory: Negative for chest tightness and shortness of breath.   Cardiovascular: Negative for chest pain, palpitations and leg swelling.  Endocrine: Negative for polydipsia, polyphagia and polyuria.  Neurological: Positive for numbness. Negative for dizziness,  weakness, light-headedness and headaches.       Positive for neuropathic pain.      Objective:    BP (!) 98/56 (BP Location: Left Arm, Patient Position: Sitting, Cuff Size: Large)   Pulse 60   Temp 97.8 F (36.6 C) (Oral)   Resp 16   Ht 5\' 10"  (1.778 m)   Wt 223 lb 6.4 oz (101.3 kg)   SpO2 91%   BMI 32.05 kg/m  Nursing note and vital signs reviewed.  Physical Exam  Constitutional: He is oriented to person, place, and time. He appears well-developed and well-nourished. No distress.    Cardiovascular: Normal rate, regular rhythm, normal heart sounds and intact distal pulses.   Pulmonary/Chest: Effort normal and breath sounds normal.  Musculoskeletal:  Bilateral distal lower extremities - No obvious deformity, discoloration or edema. No palpable tenderness able to be elicited. Normal ankle range of motion. Pulses and sensation are intact and appropriate.   Neurological: He is alert and oriented to person, place, and time.  Skin: Skin is warm and dry.  Psychiatric: He has a normal mood and affect. His behavior is normal. Judgment and thought content normal.       Assessment & Plan:   Problem List Items Addressed This Visit      Endocrine   Type 2 diabetes with nephropathy ([place]) - Primary    Diabetes remains poorly controlled with some improvement now down to 11.9. Neuropathy remains uncontrolled with gabapentin and is likely worsening secondary to poor blood sugar control. Add Trulicity. Continue current dosage of Insulin 70/30 and metformin. Encouraged modified carbohydrate diet and exercise as tolerated. Maintained on losartan and atorvastatin for CAD risk reduction. Diabetic eye and foot exams are up to date. Continue to monitor.       Relevant Medications   Dulaglutide (TRULICITY) 1.66 MA/0.0KH SOPN   Other Relevant Orders   POCT HgB A1C (Completed)       I am having [name] [name] start on Dulaglutide. I am also having him maintain his magnesium oxide, Multiple Vitamins-Minerals (MULTIVITAMIN PO), atorvastatin, apixaban, sotalol, gabapentin, insulin NPH-regular Human, sildenafil, metFORMIN, furosemide, losartan, and carvedilol.   Follow-up: Return in about 2 weeks (around 06/25/2016), or if symptoms worsen or fail to improve.  Mauricio Po, FNP

## 2016-06-11 NOTE — Patient Instructions (Addendum)
Thank you for choosing [facility].  SUMMARY AND INSTRUCTIONS:  Please continue to take the Relion with 30 units twice daily.  Start Truclicity weekly for 2 weeks.  Check on the price of Trulicity, Byduroen, or Victoza.   Work on decreasing sugar intake.  Check blood sugars 2x per day.  Follow up with Dr. Posey Pronto for the neuropathy.    Medication:  Your prescription(s) have been submitted to your pharmacy or been printed and provided for you. Please take as directed and contact our office if you believe you are having problem(s) with the medication(s) or have any questions.   Follow up:  If your symptoms worsen or fail to improve, please contact our office for further instruction, or in case of emergency go directly to the emergency room at the closest medical facility.

## 2016-06-11 NOTE — Assessment & Plan Note (Signed)
Diabetes remains poorly controlled with some improvement now down to 11.9. Neuropathy remains uncontrolled with gabapentin and is likely worsening secondary to poor blood sugar control. Add Trulicity. Continue current dosage of Insulin 70/30 and metformin. Encouraged modified carbohydrate diet and exercise as tolerated. Maintained on losartan and atorvastatin for CAD risk reduction. Diabetic eye and foot exams are up to date. Continue to monitor.

## 2016-06-17 ENCOUNTER — Encounter: Payer: Self-pay | Admitting: Neurology

## 2016-06-17 ENCOUNTER — Ambulatory Visit (INDEPENDENT_AMBULATORY_CARE_PROVIDER_SITE_OTHER): Payer: PPO | Admitting: Neurology

## 2016-06-17 VITALS — BP 108/78 | HR 75 | Ht 70.0 in | Wt 224.0 lb

## 2016-06-17 DIAGNOSIS — M792 Neuralgia and neuritis, unspecified: Secondary | ICD-10-CM

## 2016-06-17 DIAGNOSIS — E0842 Diabetes mellitus due to underlying condition with diabetic polyneuropathy: Secondary | ICD-10-CM

## 2016-06-17 MED ORDER — GABAPENTIN 300 MG PO CAPS: ORAL_CAPSULE | ORAL | 3 refills | Status: DC

## 2016-06-17 NOTE — Patient Instructions (Addendum)
1.  Increase gabapentin to 300mg  in the morning, 300mg  in the afternoon, and 600mg  at bedtime 2.  Call with an update in 2 weeks.  We may consider increasing further to 600mg  three times daily 3.  If you pain is not improved, we will switch you to Lyrica  Return to clinic in 3 months

## 2016-06-17 NOTE — Progress Notes (Signed)
Follow-up Visit   Date: 06/17/16    [name] [name] MRN: [id number] DOB: 05-07-1950   Interim History: [name] [name] is a 66 y.o. right-handed African American male with insulin-dependent diabetes mellitus, atrial flutter, CAD, CHF s/p ICD, hypertension, hyperlipidemia, and CKD returning to the clinic for follow-up of diabetic neuropathy.  The patient was accompanied to the clinic by self.  History of present illness: Starting around October 2015 he was admitted with CHF exacerbation and recalls having severe swelling of the legs. Around the same time, he began noticing tingling pain involving the feet and lower calf which has gradually been worsening over the past year. It is worse after he has been walking all day and seem less painful in the morning. He cannot stand anyone touching his feet, because it irritates it. Symptoms are worse if he is wearing constrictive shoes. There is no numbness or weakness. He has tried ibuprofen and aspirin which does not help. He endorses difficulty with balance, but has not fallen and walks independently.   He has been diabetes for 15+ years and insulin-dependent for most of this time. The last HbA1c in our system is from 2014 where his diabetes was poorly controlled at 11.5, he is unsure if this has been checked with his PCP since this time.   UPDATE 08/29/2015:  Since taking gabapentin 200mg  twice daily, he has noticed mild improvement with respect to tingling by about 20%.  He has occasional sharp stabbing pain which very brief and transient.  Balance and weakness is stable.  No new complaints.  UPDATE 10/18.2017:  He continues to appreciate benefit with gabapentin 300mg  BID and especially has noticed that Aspercream helps, but at nighttime, the burning pain can still be severe.  He would like to increase the medication, as he does not experience any side effects.  His diabetes remains poorly controlled with his last HbA1c 15.9, but is  making much stricter diet choices and trying to eliminate sweet tea and starches.  UPDATE 06/17/2016:  Unfortunately, he has no benefit with his current dose of gabapentin and would like to increase the dose or try an alternative. He denies any side effects. Some days, his pain can be so intense and he finds it difficult to get relief. He stopped Aspercream because of skin irritation.  He denies any falls or new weakness.     Medications:  Current Outpatient Prescriptions on File Prior to Visit  Medication Sig Dispense Refill  . apixaban (ELIQUIS) 5 MG TABS tablet Take 1 tablet (5 mg total) by mouth 2 (two) times daily. 60 tablet 6  . atorvastatin (LIPITOR) 80 MG tablet Take 1 tablet (80 mg total) by mouth daily. 90 tablet 3  . Dulaglutide (TRULICITY) 4.09 WJ/1.9JY SOPN Inject 0.75 mg into the skin once a week. 2 pen 0  . furosemide (LASIX) 80 MG tablet Tablet (80 mg) daily and ADD 1/2 tablet 40 mg (total dose 120 mg) tablet every other day. 45 tablet 5  . gabapentin (NEURONTIN) 300 MG capsule Take 1 tablet in the morning and 2 tablets at bedtime 270 capsule 3  . insulin NPH-regular Human (NOVOLIN 70/30 RELION) (70-30) 100 UNIT/ML injection Inject 30 Units into the skin 2 (two) times daily with a meal. 10 mL 12  . losartan (COZAAR) 25 MG tablet Take 1 tablet (25 mg total) by mouth daily. 90 tablet 3  . magnesium oxide (MAG-OX) 400 MG tablet Take 1 tablet (400 mg total) by mouth daily. 90 tablet  3  . metFORMIN (GLUCOPHAGE-XR) 750 MG 24 hr tablet TAKE TWO TABLETS BY MOUTH ONCE DAILY WITH BREAKFAST 60 tablet 1  . Multiple Vitamins-Minerals (MULTIVITAMIN PO) Take 1 capsule by mouth daily.     . sildenafil (REVATIO) 20 MG tablet Take 1-5 tablets (20-100 mg total) by mouth daily as needed. 50 tablet 0  . sotalol (BETAPACE) 120 MG tablet TAKE ONE TABLET BY MOUTH TWICE DAILY 180 tablet 3   No current facility-administered medications on file prior to visit.     Allergies:  Allergies  Allergen  Reactions  . Penicillins Other (See Comments)    Unknown reaction    Review of Systems:  CONSTITUTIONAL: No fevers, chills, night sweats, or weight loss.  EYES: No visual changes or eye pain ENT: No hearing changes.  No history of nose bleeds.   RESPIRATORY: No cough, wheezing and shortness of breath.   CARDIOVASCULAR: Negative for chest pain, and palpitations.   GI: Negative for abdominal discomfort, blood in stools or black stools.  No recent change in bowel habits.   GU:  No history of incontinence.   MUSCLOSKELETAL: No history of joint pain or swelling.  No myalgias.   SKIN: Negative for lesions, rash, and itching.   ENDOCRINE: Negative for cold or heat intolerance, polydipsia or goiter.   PSYCH:  No depression or anxiety symptoms.   NEURO: As Above.   Vital Signs:  BP 108/78   Pulse 75   Ht 5\' 10"  (1.778 m)   Wt 224 lb (101.6 kg)   SpO2 97%   BMI 32.14 kg/m   Neurological Exam: MENTAL STATUS including orientation to time, place, person, recent and remote memory, attention span and concentration, language, and fund of knowledge is normal.  Speech is not dysarthric.  CRANIAL NERVES: Pupils equal round and reactive to light.   Face is symmetric.   MOTOR:  Motor strength is 5/5 in all extremities, except toe flexion and extension 4/5.    SENSORY: Vibration is reduced distal to the ankles bilaterally.   COORDINATION/GAIT:  Gait slightly wide-based and stable; unassisted.   Data: Labs 05/29/2015:  Copper 134, TSH 4.29, HbA1c 11.6*, vitamin B12 751 Lab Results  Component Value Date   HGBA1C 11.9 06/11/2016    IMPRESSION/PLAN: 1.  Diabetic distal and symmetric polyneuropathy, worsening pain  - Encouraged tight control of blood sugars as his HbA1c remains markedly elevated  - Increase gabapentin to 300mg  in the morning and afternoon, continue 600mg  at bedtime.  Can increase to 600mg  TID, if needed.  - If no improvement, switch to Lyrica   2.  Insulin-dependent  diabetes mellitus, uncontrolled but making a conscious effort with his diet  3.  Chronic kidney disease  4. Atrial flutter on Eliquis  Return to clinic in 3 months  The duration of this appointment visit was 25 minutes of face-to-face time with the patient.  Greater than 50% of this time was spent in counseling, explanation of diagnosis, planning of further management, and coordination of care.   Thank you for allowing me to participate in patient's care.  If I can answer any additional questions, I would be pleased to do so.    Sincerely,    Donika K. Posey Pronto, DO

## 2016-06-18 ENCOUNTER — Other Ambulatory Visit: Payer: Self-pay | Admitting: Family

## 2016-06-18 DIAGNOSIS — E0842 Diabetes mellitus due to underlying condition with diabetic polyneuropathy: Secondary | ICD-10-CM

## 2016-06-23 ENCOUNTER — Telehealth: Payer: Self-pay | Admitting: Neurology

## 2016-06-23 ENCOUNTER — Other Ambulatory Visit: Payer: Self-pay | Admitting: *Deleted

## 2016-06-23 MED ORDER — GABAPENTIN 300 MG PO CAPS: ORAL_CAPSULE | ORAL | 3 refills | Status: DC

## 2016-06-23 NOTE — Telephone Encounter (Signed)
Received a call from pt regarding medication: gabapentin  Patient needs a refill of medication. Michela Pitcher he will run out on the 13th now that he is taking more.  The pharmacy will not refill early with out call.

## 2016-06-23 NOTE — Telephone Encounter (Signed)
Rx sent 

## 2016-06-25 ENCOUNTER — Telehealth: Payer: Self-pay | Admitting: Neurology

## 2016-06-25 ENCOUNTER — Encounter: Payer: Self-pay | Admitting: Family

## 2016-06-25 ENCOUNTER — Ambulatory Visit (INDEPENDENT_AMBULATORY_CARE_PROVIDER_SITE_OTHER): Payer: PPO | Admitting: Family

## 2016-06-25 ENCOUNTER — Other Ambulatory Visit: Payer: Self-pay | Admitting: *Deleted

## 2016-06-25 VITALS — BP 118/72 | HR 59 | Temp 97.7°F | Resp 16 | Ht 70.0 in | Wt 224.0 lb

## 2016-06-25 DIAGNOSIS — E0842 Diabetes mellitus due to underlying condition with diabetic polyneuropathy: Secondary | ICD-10-CM | POA: Diagnosis not present

## 2016-06-25 MED ORDER — DULAGLUTIDE 0.75 MG/0.5ML ~~LOC~~ SOAJ: 0.7500 mg | SUBCUTANEOUS | 2 refills | Status: DC

## 2016-06-25 MED ORDER — GABAPENTIN 600 MG PO TABS: 600.0000 mg | ORAL_TABLET | Freq: Three times a day (TID) | ORAL | 5 refills | Status: DC

## 2016-06-25 NOTE — Telephone Encounter (Signed)
Received a call from PT regarding medication: Gabapentin.  Patient needs a refill of medication. Yes  Patient having side effects from medication. No  Patient calling to update [place] on medication. [place]

## 2016-06-25 NOTE — Progress Notes (Signed)
Subjective:    Patient ID: [name] [name], male    DOB: 1950-06-22, 66 y.o.   MRN: [id number]  Chief Complaint  Patient presents with  . Follow-up    diabetes, has been taking trulicity     HPI:  [name] [name] is a 66 y.o. male who  has a past medical history of AICD (automatic cardioverter/defibrillator) present (02/05/2014); Atrial flutter ([place]) (04/2012); CAD (coronary artery disease) (9211,9417 X 2 ); CHF (congestive heart failure) ([place]); Chronic anticoagulation; Chronic systolic heart failure ([facility]); CKD (chronic kidney disease); DM type 2 (diabetes mellitus, type 2) ([place]); HTN (hypertension); Hypercholesteremia; ICD (implantable cardiac defibrillator) in place; Ischemic cardiomyopathy (March 2015); Nephrolithiasis; and Ventricular tachycardia ([place]). and presents today for a follow up office visit.  Diabetes - Recently started on Trulcity. Reports taking the medication as prescribed and denies adverse side effects. Blood sugars have been improved with the lowest being 88 and the highest being 146. Continues to take the metformin and insulin as prescribed. No hypoglycemia. Continues to experience the neuropathy.   Allergies  Allergen Reactions  . Penicillins Other (See Comments)    Unknown reaction      Outpatient Medications Prior to Visit  Medication Sig Dispense Refill  . apixaban (ELIQUIS) 5 MG TABS tablet Take 1 tablet (5 mg total) by mouth 2 (two) times daily. 60 tablet 6  . atorvastatin (LIPITOR) 80 MG tablet Take 1 tablet (80 mg total) by mouth daily. 90 tablet 3  . carvedilol (COREG) 3.125 MG tablet     . furosemide (LASIX) 80 MG tablet Tablet (80 mg) daily and ADD 1/2 tablet 40 mg (total dose 120 mg) tablet every other day. 45 tablet 5  . gabapentin (NEURONTIN) 300 MG capsule Take 1 tablet in the morning and afternoon and 2 tablets at bedtime 360 capsule 3  . insulin NPH-regular Human (NOVOLIN 70/30 RELION) (70-30) 100 UNIT/ML injection Inject 30 Units into the  skin 2 (two) times daily with a meal. 10 mL 12  . losartan (COZAAR) 25 MG tablet Take 1 tablet (25 mg total) by mouth daily. 90 tablet 3  . magnesium oxide (MAG-OX) 400 MG tablet Take 1 tablet (400 mg total) by mouth daily. 90 tablet 3  . metFORMIN (GLUCOPHAGE-XR) 750 MG 24 hr tablet TAKE TWO TABLETS BY MOUTH ONCE DAILY WITH  BREAKFAST 180 tablet 1  . Multiple Vitamins-Minerals (MULTIVITAMIN PO) Take 1 capsule by mouth daily.     . sildenafil (REVATIO) 20 MG tablet Take 1-5 tablets (20-100 mg total) by mouth daily as needed. 50 tablet 0  . sotalol (BETAPACE) 120 MG tablet TAKE ONE TABLET BY MOUTH TWICE DAILY 180 tablet 3  . Dulaglutide (TRULICITY) 4.08 XK/4.8JE SOPN Inject 0.75 mg into the skin once a week. 2 pen 0   No facility-administered medications prior to visit.     Review of Systems  Eyes:       Negative for changes in vision.  Respiratory: Negative for chest tightness and shortness of breath.   Cardiovascular: Negative for chest pain, palpitations and leg swelling.  Endocrine: Negative for polydipsia, polyphagia and polyuria.  Neurological: Negative for dizziness, weakness, light-headedness and headaches.      Objective:    BP 118/72 (BP Location: Left Arm, Patient Position: Sitting, Cuff Size: Large)   Pulse (!) 59   Temp 97.7 F (36.5 C) (Oral)   Resp 16   Ht 5\' 10"  (1.778 m)   Wt 224 lb (101.6 kg)   SpO2 96%  BMI 32.14 kg/m  Nursing note and vital signs reviewed.  Physical Exam  Constitutional: He is oriented to person, place, and time. He appears well-developed and well-nourished. No distress.  Cardiovascular: Normal rate, regular rhythm, normal heart sounds and intact distal pulses.   Pulmonary/Chest: Effort normal and breath sounds normal.  Neurological: He is alert and oriented to person, place, and time.  Skin: Skin is warm and dry.  Psychiatric: He has a normal mood and affect. His behavior is normal. Judgment and thought content normal.         Assessment & Plan:   Problem List Items Addressed This Visit      Endocrine   Diabetic polyneuropathy associated with diabetes mellitus due to underlying condition ([place]) - Primary    Blood sugars appear improved with addition of Trulicity and no adverse side effects. Continue current dosage of metformin, and Novolin 70/30. Monitor blood pressures at home and follow low carbohydrate intake. Continue to monitor and follow up A1c in 3 months.       Relevant Medications   Dulaglutide (TRULICITY) 7.79 ZP/6.8GA SOPN       I am having Mr. Mcgrady maintain his magnesium oxide, Multiple Vitamins-Minerals (MULTIVITAMIN PO), atorvastatin, apixaban, sotalol, insulin NPH-regular Human, sildenafil, furosemide, losartan, carvedilol, metFORMIN, gabapentin, and Dulaglutide.   Meds ordered this encounter  Medications  . Dulaglutide (TRULICITY) 4.84 FU/0.7KT SOPN    Sig: Inject 0.75 mg into the skin once a week.    Dispense:  4 pen    Refill:  2    Order Specific Question:   Supervising Provider    Answer:   Pricilla Holm A [[id number]]     Follow-up: Return in about 3 months (around 09/24/2016), or if symptoms worsen or fail to improve.  Mauricio Po, FNP

## 2016-06-25 NOTE — Assessment & Plan Note (Signed)
Blood sugars appear improved with addition of Trulicity and no adverse side effects. Continue current dosage of metformin, and Novolin 70/30. Monitor blood pressures at home and follow low carbohydrate intake. Continue to monitor and follow up A1c in 3 months.

## 2016-06-25 NOTE — Telephone Encounter (Signed)
Called [facility] and requested for them to fill Rx early since patient has increased dose.

## 2016-06-25 NOTE — Patient Instructions (Signed)
Thank you for choosing [facility].  SUMMARY AND INSTRUCTIONS:  Please continue to take your medications as prescribed.   Check on the price of Trulicity and Bydureon B-cise.   Medication:  Your prescription(s) have been submitted to your pharmacy or been printed and provided for you. Please take as directed and contact our office if you believe you are having problem(s) with the medication(s) or have any questions.  Follow up:  If your symptoms worsen or fail to improve, please contact our office for further instruction, or in case of emergency go directly to the emergency room at the closest medical facility.

## 2016-06-26 ENCOUNTER — Telehealth: Payer: Self-pay | Admitting: Neurology

## 2016-06-26 DIAGNOSIS — E113312 Type 2 diabetes mellitus with moderate nonproliferative diabetic retinopathy with macular edema, left eye: Secondary | ICD-10-CM | POA: Diagnosis not present

## 2016-06-26 NOTE — Telephone Encounter (Signed)
Patient states that we changed the dosage on his gabapentin 300 mg. He is now taking a day. Please call in a new rx to [facility] in [place]  Patient is out of medication due to dosage change

## 2016-06-26 NOTE — Telephone Encounter (Signed)
Called patient back and left message informing him that his insurance will not let me fill the Rx even though the dose has been increased.  They won't refill it until May 3.  I sent in a different Rx for gabapentin 600 mg tablets to see if maybe they will approve this.  Instructed patient to call [facility] to check on the status.

## 2016-06-29 ENCOUNTER — Ambulatory Visit (HOSPITAL_COMMUNITY)
Admission: RE | Admit: 2016-06-29 | Discharge: 2016-06-29 | Disposition: A | Discharge status: Home / Self Care | Payer: PPO | Source: Ambulatory Visit | Attending: Internal Medicine | Admitting: Internal Medicine

## 2016-06-29 ENCOUNTER — Telehealth: Payer: Self-pay | Admitting: Cardiology

## 2016-06-29 ENCOUNTER — Ambulatory Visit (INDEPENDENT_AMBULATORY_CARE_PROVIDER_SITE_OTHER): Payer: PPO | Admitting: *Deleted

## 2016-06-29 DIAGNOSIS — I70202 Unspecified atherosclerosis of native arteries of extremities, left leg: Secondary | ICD-10-CM | POA: Diagnosis not present

## 2016-06-29 DIAGNOSIS — Z95810 Presence of automatic (implantable) cardiac defibrillator: Secondary | ICD-10-CM

## 2016-06-29 DIAGNOSIS — I5022 Chronic systolic (congestive) heart failure: Secondary | ICD-10-CM | POA: Diagnosis not present

## 2016-06-29 DIAGNOSIS — I10 Essential (primary) hypertension: Secondary | ICD-10-CM | POA: Diagnosis not present

## 2016-06-29 DIAGNOSIS — E785 Hyperlipidemia, unspecified: Secondary | ICD-10-CM | POA: Diagnosis not present

## 2016-06-29 DIAGNOSIS — Z87891 Personal history of nicotine dependence: Secondary | ICD-10-CM | POA: Diagnosis not present

## 2016-06-29 DIAGNOSIS — I2510 Atherosclerotic heart disease of native coronary artery without angina pectoris: Secondary | ICD-10-CM | POA: Insufficient documentation

## 2016-06-29 DIAGNOSIS — I255 Ischemic cardiomyopathy: Secondary | ICD-10-CM

## 2016-06-29 DIAGNOSIS — E119 Type 2 diabetes mellitus without complications: Secondary | ICD-10-CM | POA: Insufficient documentation

## 2016-06-29 DIAGNOSIS — I739 Peripheral vascular disease, unspecified: Secondary | ICD-10-CM | POA: Diagnosis not present

## 2016-06-29 DIAGNOSIS — I70291 Other atherosclerosis of native arteries of extremities, right leg: Secondary | ICD-10-CM | POA: Diagnosis not present

## 2016-06-29 NOTE — Telephone Encounter (Signed)
LMOVM reminding pt to send remote transmission.   

## 2016-06-30 ENCOUNTER — Encounter: Payer: Self-pay | Admitting: Cardiology

## 2016-06-30 LAB — CUP PACEART REMOTE DEVICE CHECK
Battery Voltage: 2.98 V
Brady Statistic AP VS Percent: 0.07 %
Brady Statistic AS VP Percent: 93.86 %
Brady Statistic AS VS Percent: 3.85 %
Date Time Interrogation Session: 20180501054225
HIGH POWER IMPEDANCE MEASURED VALUE: 63 Ohm
HighPow Impedance: 48 Ohm
Implantable Lead Implant Date: 20071219
Implantable Lead Location: 753858
Implantable Lead Model: 6947
Implantable Pulse Generator Implant Date: 20151207
Lead Channel Impedance Value: 323 Ohm
Lead Channel Impedance Value: 380 Ohm
Lead Channel Impedance Value: 380 Ohm
Lead Channel Impedance Value: 570 Ohm
Lead Channel Impedance Value: 589 Ohm
Lead Channel Impedance Value: 855 Ohm
Lead Channel Impedance Value: 912 Ohm
Lead Channel Impedance Value: 969 Ohm
Lead Channel Pacing Threshold Amplitude: 0.75 V
Lead Channel Pacing Threshold Pulse Width: 0.4 ms
Lead Channel Pacing Threshold Pulse Width: 0.4 ms
Lead Channel Sensing Intrinsic Amplitude: 3.25 mV
Lead Channel Sensing Intrinsic Amplitude: 7.875 mV
Lead Channel Sensing Intrinsic Amplitude: 7.875 mV
Lead Channel Setting Pacing Amplitude: 1.5 V
Lead Channel Setting Pacing Pulse Width: 0.03 ms
Lead Channel Setting Pacing Pulse Width: 0.4 ms
MDC IDC LEAD IMPLANT DT: 20151207
MDC IDC LEAD IMPLANT DT: 20151207
MDC IDC LEAD LOCATION: 753859
MDC IDC LEAD LOCATION: 753860
MDC IDC MSMT BATTERY REMAINING LONGEVITY: 95 mo
MDC IDC MSMT LEADCHNL LV IMPEDANCE VALUE: 437 Ohm
MDC IDC MSMT LEADCHNL LV IMPEDANCE VALUE: 532 Ohm
MDC IDC MSMT LEADCHNL LV IMPEDANCE VALUE: 646 Ohm
MDC IDC MSMT LEADCHNL RA IMPEDANCE VALUE: 399 Ohm
MDC IDC MSMT LEADCHNL RA PACING THRESHOLD AMPLITUDE: 0.5 V
MDC IDC MSMT LEADCHNL RA SENSING INTR AMPL: 3.25 mV
MDC IDC MSMT LEADCHNL RV IMPEDANCE VALUE: 323 Ohm
MDC IDC MSMT LEADCHNL RV PACING THRESHOLD AMPLITUDE: 0.875 V
MDC IDC MSMT LEADCHNL RV PACING THRESHOLD PULSEWIDTH: 0.4 ms
MDC IDC SET LEADCHNL LV PACING AMPLITUDE: 1.75 V
MDC IDC SET LEADCHNL RV PACING AMPLITUDE: 0.5 V
MDC IDC SET LEADCHNL RV SENSING SENSITIVITY: 0.3 mV
MDC IDC STAT BRADY AP VP PERCENT: 2.21 %
MDC IDC STAT BRADY RA PERCENT PACED: 2.26 %
MDC IDC STAT BRADY RV PERCENT PACED: 95.44 %

## 2016-06-30 NOTE — Progress Notes (Signed)
Remote ICD transmission.   

## 2016-07-02 NOTE — Progress Notes (Signed)
EPIC Encounter for ICM Monitoring  Patient Name: [name] [name] is a 66 y.o. male Date: 07/02/2016 Primary Care Physican: Mauricio Po, [place] Primary Cardiologist:Crenshaw Electrophysiologist: [name] Dry Weight:219 lbs Bi-V Pacing:  95.4%       Heart Failure questions reviewed, pt asymptomatic.   Thoracic impedance abnormal suggesting fluid accumulation since 06/21/2016.  Prescribed: Furosemide 80 mg daily and add 1/2 tablet (40 mg total) every other day  Labs: 06/02/2016 Creatinine 1.16, BUN 18, Potassium 4.7, Sodium 137 04/06/2016 Creatinine 0.92, BUN 19, Potassium 4.2, Sodium 138 11/25/2015 Creatinine 1.31, BUN 19, Potassium 5.0, Sodium 134   Recommendations: No changes today but advised to limit salt intake to 2000 mg/day and fluid intake to < 2 liters/day.  Encouraged to call for fluid symptoms or use local emergency room for any urgent symptoms.  Follow-up plan: ICM clinic phone appointment on 07/14/2016 will recheck fluid levels.    Copy of ICM check sent to primary cardiologist and device physician.   3 month ICM trend: 06/30/2016   1 Year ICM trend:      [name], RN 07/02/2016 1:35 PM

## 2016-07-13 ENCOUNTER — Telehealth: Payer: Self-pay | Admitting: Student

## 2016-07-13 NOTE — Telephone Encounter (Signed)
New message     Pt returning call for LE Arterial results

## 2016-07-13 NOTE — Telephone Encounter (Signed)
-----   Message from Erma Heritage, [place] sent at 06/30/2016  4:34 PM EDT ----- Please let the patient know his lower extremity doppler studies showed mild plaque in both lower extremities. 30-49% right distal stenosis and no evidence of significant stenosis along the left lower extremity. Continue statin. No ASA due to need for Eliquis. No further intervention indicated at this time.

## 2016-07-14 ENCOUNTER — Encounter: Payer: Self-pay | Admitting: Cardiology

## 2016-07-14 ENCOUNTER — Telehealth: Payer: Self-pay | Admitting: Cardiology

## 2016-07-14 ENCOUNTER — Ambulatory Visit (INDEPENDENT_AMBULATORY_CARE_PROVIDER_SITE_OTHER): Payer: PPO

## 2016-07-14 DIAGNOSIS — Z95810 Presence of automatic (implantable) cardiac defibrillator: Secondary | ICD-10-CM | POA: Diagnosis not present

## 2016-07-14 DIAGNOSIS — I5022 Chronic systolic (congestive) heart failure: Secondary | ICD-10-CM

## 2016-07-14 NOTE — Telephone Encounter (Signed)
Spoke with pt and reminded pt of remote transmission that is due today. Pt verbalized understanding.   

## 2016-07-16 ENCOUNTER — Telehealth: Payer: Self-pay

## 2016-07-16 NOTE — Progress Notes (Signed)
EPIC Encounter for ICM Monitoring  Patient Name: [name] [name] is a 66 y.o. male Date: 07/16/2016 Primary Care Physican: Golden Circle, [place] Primary Cardiologist:Crenshaw Electrophysiologist: [name] Dry Weight: Last ICM [id number] lbs Bi-V Pacing: 98.6%           Attempted call to patient and unable to reach.  Left detailed message regarding transmission.  Transmission reviewed.    Thoracic impedance abnormal suggesting fluid accumulation but very close to baseline.  Prescribed: Furosemide 80 mg daily and add 1/2 tablet (40 mg total) every other day  Labs: 06/02/2016 Creatinine 1.16, BUN 18, Potassium 4.7, Sodium 137 04/06/2016 Creatinine 0.92, BUN 19, Potassium 4.2, Sodium 138 11/25/2015 Creatinine 1.31, BUN 19, Potassium 5.0, Sodium 134  Recommendations: Left voice mail with ICM number and encouraged to call for fluid symptoms.  Follow-up plan: ICM clinic phone appointment on 08/03/2016.    Copy of ICM check sent to Dr [name] and Dr [name] for review.    3 month ICM trend: 07/16/2016   1 Year ICM trend:      [name], RN 07/16/2016 10:06 AM

## 2016-07-16 NOTE — Telephone Encounter (Signed)
Remote ICM transmission received.  Attempted patient call and left detailed message regarding transmission and next ICM scheduled for 08/03/2016.  Advised to return call for any fluid symptoms or questions.    

## 2016-07-31 ENCOUNTER — Other Ambulatory Visit: Payer: Self-pay | Admitting: Cardiology

## 2016-08-03 ENCOUNTER — Ambulatory Visit (INDEPENDENT_AMBULATORY_CARE_PROVIDER_SITE_OTHER): Payer: PPO

## 2016-08-03 ENCOUNTER — Telehealth: Payer: Self-pay

## 2016-08-03 DIAGNOSIS — I5022 Chronic systolic (congestive) heart failure: Secondary | ICD-10-CM

## 2016-08-03 DIAGNOSIS — Z95810 Presence of automatic (implantable) cardiac defibrillator: Secondary | ICD-10-CM

## 2016-08-03 NOTE — Telephone Encounter (Signed)
Remote ICM transmission received.  Attempted patient call and left message to return call.   

## 2016-08-03 NOTE — Progress Notes (Signed)
Patient returned call and stated he is feeling fine.  He reported feeling a little lightheaded last week because his BP was low.  He denied any fluid symptoms.  Reviewed transmission and advised it suggests he may be starting to retain fluid yesterday and to monitor and limit salt intake.  Will recheck fluid levels on 08/14/2016.  Encouraged him to call for any fluid symptoms.  No changes today.

## 2016-08-03 NOTE — Progress Notes (Signed)
EPIC Encounter for ICM Monitoring  Patient Name: [name] [name] is a 66 y.o. male Date: 08/03/2016 Primary Care Physican: Golden Circle, FNP Primary Cardiologist:Crenshaw Electrophysiologist: [name] Dry Weight: Last ICM [id number] lbs Bi-V Pacing: 92.2%  Clinical Status (16-Jul-2016 to 03-Aug-2016) Treated VT/VF 0 episodes AT/AF 2 episodes  Time in AT/AF 0.2 hr/day (0.7%)     Attempted call to patient and unable to reach.  Left message to return call.  Transmission reviewed.    Thoracic impedance abnormal suggesting fluid accumulation since 08/02/2016.  Prescribed: Furosemide 80 mg daily and add 1/2 tablet (40 mg total) every other day  Labs: 06/02/2016 Creatinine 1.16, BUN 18, Potassium 4.7, Sodium 137 04/06/2016 Creatinine 0.92, BUN 19, Potassium 4.2, Sodium 138 09/25/2017Creatinine 1.31, BUN 19, Potassium 5.0, Sodium 134  Recommendations: NONE - Unable to reach patient   Follow-up plan: ICM clinic phone appointment on 08/14/2016.    Copy of ICM check sent to primary cardiologist and device physician.   3 month ICM trend: 08/03/2016   1 Year ICM trend:      [name], RN 08/03/2016 9:13 AM

## 2016-08-07 DIAGNOSIS — E113412 Type 2 diabetes mellitus with severe nonproliferative diabetic retinopathy with macular edema, left eye: Secondary | ICD-10-CM | POA: Diagnosis not present

## 2016-08-07 DIAGNOSIS — E113311 Type 2 diabetes mellitus with moderate nonproliferative diabetic retinopathy with macular edema, right eye: Secondary | ICD-10-CM | POA: Diagnosis not present

## 2016-08-14 ENCOUNTER — Telehealth: Payer: Self-pay | Admitting: Cardiology

## 2016-08-14 ENCOUNTER — Ambulatory Visit (INDEPENDENT_AMBULATORY_CARE_PROVIDER_SITE_OTHER): Payer: PPO

## 2016-08-14 DIAGNOSIS — Z95810 Presence of automatic (implantable) cardiac defibrillator: Secondary | ICD-10-CM

## 2016-08-14 DIAGNOSIS — I5022 Chronic systolic (congestive) heart failure: Secondary | ICD-10-CM

## 2016-08-14 DIAGNOSIS — E113311 Type 2 diabetes mellitus with moderate nonproliferative diabetic retinopathy with macular edema, right eye: Secondary | ICD-10-CM | POA: Diagnosis not present

## 2016-08-14 DIAGNOSIS — E113312 Type 2 diabetes mellitus with moderate nonproliferative diabetic retinopathy with macular edema, left eye: Secondary | ICD-10-CM | POA: Diagnosis not present

## 2016-08-14 NOTE — Telephone Encounter (Signed)
LMOVM reminding pt to send remote transmission.   

## 2016-08-18 ENCOUNTER — Telehealth: Payer: Self-pay

## 2016-08-18 NOTE — Telephone Encounter (Signed)
Remote ICM transmission received.  Attempted patient call and left message to return call regarding transmission.    

## 2016-08-18 NOTE — Progress Notes (Signed)
EPIC Encounter for ICM Monitoring  Patient Name: [name] [name] is a 67 y.o. male Date: 08/18/2016 Primary Care Physican: Golden Circle, FNP Primary Cardiologist:Crenshaw Electrophysiologist: [name] Dry Weight: Last ICM [id number] lbs Bi-V Pacing: 93.3%      Attempted call to patient and unable to reach.  Left message to return call regarding transmission.  Transmission reviewed.    Thoracic impedance abnormal suggesting fluid accumulation since approximately 08/03/2016.  Prescribed: Furosemide 80 mg daily and add 1/2 tablet (40 mg total) every other day  Labs: 06/02/2016 Creatinine 1.16, BUN 18, Potassium 4.7, Sodium 137 04/06/2016 Creatinine 0.92, BUN 19, Potassium 4.2, Sodium 138 09/25/2017Creatinine 1.31, BUN 19, Potassium 5.0, Sodium 134  Recommendations:  NONE - Unable to reach patient   Follow-up plan: ICM clinic phone appointment on 6/262018 to recheck fluid levels.    Copy of ICM check sent to primary cardiologist and device physician for review.   3 month ICM trend: 08/16/2016   1 Year ICM trend:      [name], RN 08/18/2016 4:42 PM

## 2016-08-25 ENCOUNTER — Telehealth: Payer: Self-pay | Admitting: Cardiology

## 2016-08-25 NOTE — Telephone Encounter (Signed)
LMOVM reminding pt to send remote transmission.   

## 2016-08-28 DIAGNOSIS — E113311 Type 2 diabetes mellitus with moderate nonproliferative diabetic retinopathy with macular edema, right eye: Secondary | ICD-10-CM | POA: Diagnosis not present

## 2016-09-03 NOTE — Progress Notes (Signed)
No ICM remote transmission received for 08/25/2016 and next ICM transmission scheduled for 09/28/2016.

## 2016-09-07 DIAGNOSIS — Z961 Presence of intraocular lens: Secondary | ICD-10-CM | POA: Diagnosis not present

## 2016-09-07 DIAGNOSIS — E113413 Type 2 diabetes mellitus with severe nonproliferative diabetic retinopathy with macular edema, bilateral: Secondary | ICD-10-CM | POA: Diagnosis not present

## 2016-09-11 ENCOUNTER — Ambulatory Visit: Payer: PPO | Admitting: Neurology

## 2016-09-11 DIAGNOSIS — E113311 Type 2 diabetes mellitus with moderate nonproliferative diabetic retinopathy with macular edema, right eye: Secondary | ICD-10-CM | POA: Diagnosis not present

## 2016-09-11 DIAGNOSIS — E113312 Type 2 diabetes mellitus with moderate nonproliferative diabetic retinopathy with macular edema, left eye: Secondary | ICD-10-CM | POA: Diagnosis not present

## 2016-09-24 ENCOUNTER — Encounter: Payer: Self-pay | Admitting: Family

## 2016-09-24 ENCOUNTER — Ambulatory Visit: Payer: PPO | Admitting: Neurology

## 2016-09-24 ENCOUNTER — Ambulatory Visit (INDEPENDENT_AMBULATORY_CARE_PROVIDER_SITE_OTHER): Payer: PPO | Admitting: Family

## 2016-09-24 VITALS — BP 94/62 | HR 55 | Temp 97.7°F | Resp 16 | Ht 70.0 in | Wt 233.4 lb

## 2016-09-24 DIAGNOSIS — E1121 Type 2 diabetes mellitus with diabetic nephropathy: Secondary | ICD-10-CM

## 2016-09-24 DIAGNOSIS — Z029 Encounter for administrative examinations, unspecified: Secondary | ICD-10-CM

## 2016-09-24 LAB — POCT GLYCOSYLATED HEMOGLOBIN (HGB A1C): Hemoglobin A1C: 6.6

## 2016-09-24 NOTE — Assessment & Plan Note (Signed)
In office A1c of 6.6 significantly improved from previous 11.9. Taking medications as prescribed with some loose stools on occasion but tolerable. Continue current dosage of metformin, Novilin 76/54 and Trulicity. Maintained on losartan and atorvastatin for CAD risk reduction. Diabetic foot and eye exams are up to date. Continue to monitor blood sugars at home 1-2 times daily and as needed. Follow up in 3 months or sooner if needed.

## 2016-09-24 NOTE — Progress Notes (Signed)
Subjective:    Patient ID: [name] [name], male    DOB: 1951-02-12, 66 y.o.   MRN: [id number]  Chief Complaint  Patient presents with  . Follow-up    diabetes management     HPI:  [name] [name] is a 66 y.o. male who  has a past medical history of AICD (automatic cardioverter/defibrillator) present (02/05/2014); Atrial flutter ([place]) (04/2012); CAD (coronary artery disease) (3810,1751 X 2 ); CHF (congestive heart failure) ([place]); Chronic anticoagulation; Chronic systolic heart failure (HCC); CKD (chronic kidney disease); DM type 2 (diabetes mellitus, type 2) ([place]); HTN (hypertension); Hypercholesteremia; ICD (implantable cardiac defibrillator) in place; Ischemic cardiomyopathy (March 2015); Nephrolithiasis; and Ventricular tachycardia ([name]). and presents today for a follow up office visit.  Diabetes - Currently maintained on Metformin,Trulcitiy and Novolvin 70/30. Reports taking the medication as prescribed and denies adverse side effects or hypoglycemic readings. Blood sugars at home . Denies new symptoms of end organ damage. No excessive hunger, thirst, or urination. Working on a low/carbohydrate modified oral intake.   Lab Results  Component Value Date   HGBA1C 6.6 09/24/2016    Lab Results  Component Value Date   CREATININE 1.16 06/02/2016   BUN 18 06/02/2016   NA 137 06/02/2016   K 4.7 06/02/2016   CL 102 06/02/2016   CO2 23 06/02/2016     Allergies  Allergen Reactions  . Penicillins Other (See Comments)    Unknown reaction      Outpatient Medications Prior to Visit  Medication Sig Dispense Refill  . apixaban (ELIQUIS) 5 MG TABS tablet Take 1 tablet (5 mg total) by mouth 2 (two) times daily. 60 tablet 6  . atorvastatin (LIPITOR) 80 MG tablet TAKE ONE TABLET BY MOUTH ONCE DAILY 90 tablet 3  . carvedilol (COREG) 3.125 MG tablet     . Dulaglutide (TRULICITY) 0.25 EN/2.7PO SOPN Inject 0.75 mg into the skin once a week. 4 pen 2  . furosemide (LASIX) 80 MG tablet  TAKE ONE TABLET BY MOUTH ONCE DAILY 135 tablet 3  . gabapentin (NEURONTIN) 300 MG capsule Take 1 tablet in the morning and afternoon and 2 tablets at bedtime 360 capsule 3  . gabapentin (NEURONTIN) 600 MG tablet Take 1 tablet (600 mg total) by mouth 3 (three) times daily. 90 tablet 5  . insulin NPH-regular Human (NOVOLIN 70/30 RELION) (70-30) 100 UNIT/ML injection Inject 30 Units into the skin 2 (two) times daily with a meal. 10 mL 12  . losartan (COZAAR) 25 MG tablet Take 1 tablet (25 mg total) by mouth daily. 90 tablet 3  . magnesium oxide (MAG-OX) 400 MG tablet Take 1 tablet (400 mg total) by mouth daily. 90 tablet 3  . metFORMIN (GLUCOPHAGE-XR) 750 MG 24 hr tablet TAKE TWO TABLETS BY MOUTH ONCE DAILY WITH  BREAKFAST 180 tablet 1  . Multiple Vitamins-Minerals (MULTIVITAMIN [name]) Take 1 capsule by mouth daily.     . sildenafil (REVATIO) 20 MG tablet Take 1-5 tablets (20-100 mg total) by mouth daily as needed. 50 tablet 0  . sotalol (BETAPACE) 120 MG tablet TAKE ONE TABLET BY MOUTH TWICE DAILY 180 tablet 3   No facility-administered medications prior to visit.      Review of Systems  Eyes:       Negative for changes in vision.  Respiratory: Negative for chest tightness and shortness of breath.   Cardiovascular: Negative for chest pain, palpitations and leg swelling.  Endocrine: Negative for polydipsia, polyphagia and polyuria.  Neurological: Negative for dizziness, weakness,  light-headedness and headaches.      Objective:    BP 94/62 (BP Location: Left Arm, Patient Position: Sitting, Cuff Size: Large)   Pulse (!) 55   Temp 97.7 F (36.5 C) (Oral)   Resp 16   Ht 5\' 10"  (1.778 m)   Wt 233 lb 6.4 oz (105.9 kg)   SpO2 95%   BMI 33.49 kg/m  Nursing note and vital signs reviewed.  Physical Exam  Constitutional: He is oriented to person, place, and time. He appears well-developed and well-nourished. No distress.  Cardiovascular: Normal rate, regular rhythm, normal heart sounds and  intact distal pulses.   Pulmonary/Chest: Effort normal and breath sounds normal.  Neurological: He is alert and oriented to person, place, and time.  Skin: Skin is warm and dry.  Psychiatric: He has a normal mood and affect. His behavior is normal. Judgment and thought content normal.       Assessment & Plan:   Problem List Items Addressed This Visit      Endocrine   Type 2 diabetes with nephropathy ([place]) - Primary    In office A1c of 6.6 significantly improved from previous 11.9. Taking medications as prescribed with some loose stools on occasion but tolerable. Continue current dosage of metformin, Novilin 42/35 and Trulicity. Maintained on losartan and atorvastatin for CAD risk reduction. Diabetic foot and eye exams are up to date. Continue to monitor blood sugars at home 1-2 times daily and as needed. Follow up in 3 months or sooner if needed.       Relevant Orders   POCT HgB A1C (Completed)       I am having Mr. Holtsclaw maintain his magnesium oxide, Multiple Vitamins-Minerals (MULTIVITAMIN [name]), apixaban, insulin NPH-regular Human, sildenafil, losartan, carvedilol, metFORMIN, gabapentin, Dulaglutide, gabapentin, furosemide, atorvastatin, and sotalol.   Follow-up: Return in about 3 months (around 12/25/2016), or if symptoms worsen or fail to improve.  [name] Po, FNP

## 2016-09-24 NOTE — Patient Instructions (Addendum)
Thank you for choosing [facility].  SUMMARY AND INSTRUCTIONS:  Continue to check your blood sugars at home.  Your A1c is EXCEPTIONAL!  Continue current dosage of Trulicity, metformin and Insulin 70/30.   Benfotiamine 300 mg Alpha Lipoic Acid 600 mg   Follow up in 3 months.   Medication:  Your prescription(s) have been submitted to your pharmacy or been printed and provided for you. Please take as directed and contact our office if you believe you are having problem(s) with the medication(s) or have any questions.  Follow up:  If your symptoms worsen or fail to improve, please contact our office for further instruction, or in case of emergency go directly to the emergency room at the closest medical facility.

## 2016-09-25 ENCOUNTER — Encounter: Payer: Self-pay | Admitting: Neurology

## 2016-09-28 ENCOUNTER — Encounter: Payer: PPO | Admitting: *Deleted

## 2016-09-28 ENCOUNTER — Telehealth: Payer: Self-pay | Admitting: Cardiology

## 2016-09-28 NOTE — Telephone Encounter (Signed)
LMOVM reminding pt to send remote transmission.   

## 2016-10-02 ENCOUNTER — Encounter: Payer: Self-pay | Admitting: Cardiology

## 2016-10-02 DIAGNOSIS — E113312 Type 2 diabetes mellitus with moderate nonproliferative diabetic retinopathy with macular edema, left eye: Secondary | ICD-10-CM | POA: Diagnosis not present

## 2016-10-09 NOTE — Progress Notes (Signed)
No ICM remote transmission received for 09/25/2016 and next ICM transmission scheduled for 10/20/2016.    

## 2016-10-20 ENCOUNTER — Telehealth: Payer: Self-pay

## 2016-10-20 ENCOUNTER — Ambulatory Visit (INDEPENDENT_AMBULATORY_CARE_PROVIDER_SITE_OTHER): Payer: PPO | Admitting: *Deleted

## 2016-10-20 DIAGNOSIS — I255 Ischemic cardiomyopathy: Secondary | ICD-10-CM

## 2016-10-20 DIAGNOSIS — I5022 Chronic systolic (congestive) heart failure: Secondary | ICD-10-CM

## 2016-10-20 DIAGNOSIS — Z95810 Presence of automatic (implantable) cardiac defibrillator: Secondary | ICD-10-CM | POA: Diagnosis not present

## 2016-10-20 NOTE — Telephone Encounter (Signed)
Remote ICM transmission received.  Attempted patient call and left detailed message regarding transmission and requested to send a transmission today for review.

## 2016-10-20 NOTE — Progress Notes (Signed)
EPIC Encounter for ICM Monitoring  Patient Name: [name] [name] is a 66 y.o. male Date: 10/20/2016 Primary Care Physican: Golden Circle, FNP Primary Cardiologist:Crenshaw Electrophysiologist: [name] Dry Weight: Last ICM [id number] lbs Bi-V Pacing: 89% (decreased since June from 93.3%)   Clinical Status (16-Aug-2016 to 17-Oct-2016) Treated VT/VF 0 episodes  V. Pacing 89.0% Atrial Pacing 3.2%  Lower Rate 50 bpm Upper Rate 130 bpm AT/AF 3 episodes  Time in AT/AF 0.6 hr/day (2.6%)  VT-NS (>4 beats, >158 bpm) 2  Observations (2) (16-Aug-2016 to 17-Oct-2016)  AT/AF >= 6 hr for 2 days.  V. Pacing less than 90%.     Attempted call to patient and unable to reach.  Left message to return call. Transmission reviewed.    Thoracic impedance abnormal suggesting fluid accumulation since 08/01/2016.  Prescribed dosage: Furosemide 80 mg 1 tablet daily   Labs: 06/02/2016 Creatinine 1.16, BUN 18, Potassium 4.7, Sodium 137 04/06/2016 Creatinine 0.92, BUN 19, Potassium 4.2, Sodium 138 09/25/2017Creatinine 1.31, BUN 19, Potassium 5.0, Sodium 134  Recommendations: NONE - Unable to reach patient   Follow-up plan: ICM clinic phone appointment on 10/23/2016 to recheck fluid levels.    Copy of ICM check sent to Dr [name] and Dr [name] for review.  3 month ICM trend: 10/17/2016   AT/AF    1 Year ICM trend:      [name], RN 10/20/2016 9:33 AM

## 2016-10-20 NOTE — Progress Notes (Signed)
ICD Remote Transmission  

## 2016-10-21 LAB — CUP PACEART REMOTE DEVICE CHECK
Battery Remaining Longevity: 89 mo
Brady Statistic AP VP Percent: 3.22 %
Brady Statistic AS VP Percent: 85.85 %
Brady Statistic AS VS Percent: 10.87 %
Date Time Interrogation Session: 20180818160200
HIGH POWER IMPEDANCE MEASURED VALUE: 39 Ohm
HIGH POWER IMPEDANCE MEASURED VALUE: 52 Ohm
Implantable Lead Implant Date: 20151207
Implantable Lead Location: 753859
Implantable Lead Model: 4598
Lead Channel Impedance Value: 304 Ohm
Lead Channel Impedance Value: 380 Ohm
Lead Channel Impedance Value: 380 Ohm
Lead Channel Impedance Value: 380 Ohm
Lead Channel Impedance Value: 513 Ohm
Lead Channel Impedance Value: 760 Ohm
Lead Channel Impedance Value: 855 Ohm
Lead Channel Pacing Threshold Amplitude: 0.625 V
Lead Channel Pacing Threshold Amplitude: 0.625 V
Lead Channel Pacing Threshold Pulse Width: 0.4 ms
Lead Channel Pacing Threshold Pulse Width: 0.4 ms
Lead Channel Setting Pacing Amplitude: 0.5 V
Lead Channel Setting Pacing Amplitude: 1.5 V
Lead Channel Setting Pacing Pulse Width: 0.4 ms
Lead Channel Setting Sensing Sensitivity: 0.3 mV
MDC IDC LEAD IMPLANT DT: 20071219
MDC IDC LEAD IMPLANT DT: 20151207
MDC IDC LEAD LOCATION: 753858
MDC IDC LEAD LOCATION: 753860
MDC IDC MSMT BATTERY VOLTAGE: 2.99 V
MDC IDC MSMT LEADCHNL LV IMPEDANCE VALUE: 304 Ohm
MDC IDC MSMT LEADCHNL LV IMPEDANCE VALUE: 532 Ohm
MDC IDC MSMT LEADCHNL LV IMPEDANCE VALUE: 570 Ohm
MDC IDC MSMT LEADCHNL LV IMPEDANCE VALUE: 570 Ohm
MDC IDC MSMT LEADCHNL LV IMPEDANCE VALUE: 722 Ohm
MDC IDC MSMT LEADCHNL RA IMPEDANCE VALUE: 342 Ohm
MDC IDC MSMT LEADCHNL RA SENSING INTR AMPL: 0.875 mV
MDC IDC MSMT LEADCHNL RA SENSING INTR AMPL: 0.875 mV
MDC IDC MSMT LEADCHNL RV PACING THRESHOLD AMPLITUDE: 1 V
MDC IDC MSMT LEADCHNL RV PACING THRESHOLD PULSEWIDTH: 0.4 ms
MDC IDC MSMT LEADCHNL RV SENSING INTR AMPL: 6.75 mV
MDC IDC MSMT LEADCHNL RV SENSING INTR AMPL: 6.75 mV
MDC IDC PG IMPLANT DT: 20151207
MDC IDC SET LEADCHNL LV PACING AMPLITUDE: 1.75 V
MDC IDC SET LEADCHNL RV PACING PULSEWIDTH: 0.03 ms
MDC IDC STAT BRADY AP VS PERCENT: 0.06 %
MDC IDC STAT BRADY RA PERCENT PACED: 3.21 %
MDC IDC STAT BRADY RV PERCENT PACED: 88.99 %

## 2016-10-29 ENCOUNTER — Ambulatory Visit (INDEPENDENT_AMBULATORY_CARE_PROVIDER_SITE_OTHER): Payer: Self-pay

## 2016-10-29 DIAGNOSIS — Z95810 Presence of automatic (implantable) cardiac defibrillator: Secondary | ICD-10-CM

## 2016-10-29 DIAGNOSIS — I5022 Chronic systolic (congestive) heart failure: Secondary | ICD-10-CM

## 2016-10-30 ENCOUNTER — Encounter: Payer: Self-pay | Admitting: Cardiology

## 2016-10-30 ENCOUNTER — Telehealth: Payer: Self-pay

## 2016-10-30 NOTE — Progress Notes (Signed)
EPIC Encounter for ICM Monitoring  Patient Name: [name] [name] is a 66 y.o. male Date: 10/30/2016 Primary Care Physican: Golden Circle, FNP Primary Cardiologist:Crenshaw Electrophysiologist: [name] Dry Weight: Last ICM [id number] lbs Bi-V Pacing: 69% (decreased since June from 93.3%)  Clinical Status (17-Oct-2016 to 30-Oct-2016) Treated VT/VF 0 episodes  V. Pacing 69.6% Atrial Pacing 0.3%  Lower Rate 50 bpm Upper Rate 130 bpm  AT/AF 1 episode  Time in AT/AF 24.0 hr/day (100.0%)   Observations (2) (17-Oct-2016 to 30-Oct-2016)  AT/AF >= 6 hr for 14 days.         Attempted call to patient and unable to reach.  Left detailed message regarding transmission.  Transmission reviewed.    Thoracic impedance continues to be abnormal suggesting fluid accumulation since 08/01/2016.  Prescribed dosage: Furosemide 80 mg 1 tablet daily   Labs: 06/02/2016 Creatinine 1.16, BUN 18, Potassium 4.7, Sodium 137 04/06/2016 Creatinine 0.92, BUN 19, Potassium 4.2, Sodium 138 09/25/2017Creatinine 1.31, BUN 19, Potassium 5.0, Sodium 134  Recommendations: NONE - Unable to reach patient   Follow-up plan: ICM clinic phone appointment on 11/12/2016 to recheck fluid levels.  Patient is due to make an appointment with Dr [name].   Copy of ICM check sent to Dr. [name] and Dr. [name].   Appears patient out of NSR since 10/15/16.    3 month ICM trend: 10/30/2016   AT/AF     1 Year ICM trend:      [name], RN 10/30/2016 9:33 AM

## 2016-10-30 NOTE — Progress Notes (Signed)
Patient returned call and left voice mail message stating he was at work and not allowed to have cell phones so I will need to call and leave him a message.  He stated he is doing fine.

## 2016-10-30 NOTE — Progress Notes (Signed)
Attempted return call to patient and advised that report showing fluid since June and if he has any fluid symptoms over the weekend to use ER if needed.  Advised to limit salt intake and requested he call back next week.

## 2016-10-30 NOTE — Telephone Encounter (Signed)
Remote ICM transmission received.  Attempted call to patient and left message to return call. 

## 2016-10-30 NOTE — Addendum Note (Signed)
Addended by: [name] on: 10/30/2016 05:12 PM   Modules accepted: Level of Service

## 2016-11-06 DIAGNOSIS — E113311 Type 2 diabetes mellitus with moderate nonproliferative diabetic retinopathy with macular edema, right eye: Secondary | ICD-10-CM | POA: Diagnosis not present

## 2016-11-06 DIAGNOSIS — E113511 Type 2 diabetes mellitus with proliferative diabetic retinopathy with macular edema, right eye: Secondary | ICD-10-CM | POA: Diagnosis not present

## 2016-11-12 ENCOUNTER — Encounter: Payer: Self-pay | Admitting: Family

## 2016-11-12 ENCOUNTER — Ambulatory Visit (INDEPENDENT_AMBULATORY_CARE_PROVIDER_SITE_OTHER): Payer: PPO | Admitting: Family

## 2016-11-12 ENCOUNTER — Ambulatory Visit (INDEPENDENT_AMBULATORY_CARE_PROVIDER_SITE_OTHER): Payer: Self-pay

## 2016-11-12 ENCOUNTER — Other Ambulatory Visit (INDEPENDENT_AMBULATORY_CARE_PROVIDER_SITE_OTHER): Payer: PPO

## 2016-11-12 VITALS — BP 108/68 | HR 78 | Temp 97.8°F | Resp 16 | Ht 70.0 in | Wt 247.0 lb

## 2016-11-12 DIAGNOSIS — R600 Localized edema: Secondary | ICD-10-CM | POA: Diagnosis not present

## 2016-11-12 DIAGNOSIS — Z95810 Presence of automatic (implantable) cardiac defibrillator: Secondary | ICD-10-CM

## 2016-11-12 DIAGNOSIS — Z23 Encounter for immunization: Secondary | ICD-10-CM | POA: Diagnosis not present

## 2016-11-12 DIAGNOSIS — I5022 Chronic systolic (congestive) heart failure: Secondary | ICD-10-CM

## 2016-11-12 LAB — COMPREHENSIVE METABOLIC PANEL
ALBUMIN: 3.6 g/dL (ref 3.5–5.2)
ALT: 13 U/L (ref 0–53)
AST: 15 U/L (ref 0–37)
Alkaline Phosphatase: 94 U/L (ref 39–117)
BUN: 30 mg/dL — AB (ref 6–23)
CHLORIDE: 102 meq/L (ref 96–112)
CO2: 27 meq/L (ref 19–32)
CREATININE: 1.49 mg/dL (ref 0.40–1.50)
Calcium: 9.5 mg/dL (ref 8.4–10.5)
GFR: 60.56 mL/min (ref >60.00)
GLUCOSE: 167 mg/dL — AB (ref 70–99)
POTASSIUM: 4.2 meq/L (ref 3.5–5.1)
SODIUM: 138 meq/L (ref 135–145)
Total Bilirubin: 1.1 mg/dL (ref 0.2–1.2)
Total Protein: 7.6 g/dL (ref 6.0–8.3)

## 2016-11-12 LAB — BRAIN NATRIURETIC PEPTIDE: PRO B NATRI PEPTIDE: 388 pg/mL — AB (ref 0.0–100.0)

## 2016-11-12 MED ORDER — FUROSEMIDE 80 MG PO TABS: ORAL_TABLET | ORAL | 0 refills | Status: DC

## 2016-11-12 NOTE — Progress Notes (Signed)
EPIC Encounter for ICM Monitoring  Patient Name: [name] [name] is a 66 y.o. male Date: 11/12/2016 Primary Care Physican: Golden Circle, FNP Primary Cardiologist:Crenshaw Electrophysiologist: [name] Dry Weight:unknown Bi-V Pacing: 64.5% (decreased since June from 93.3%)   Since 30-Oct-2016 Time in AT/AF 24.0 hr/day (100.0%)  Longest AT/AF 27 days  Observations: ?AT/AF Daily Burden > Threshold  ?V. Pacing < 90%  ?Low Patient Activity  ?424 V. Sensing Episodes  ?13 days in AT/AF Since Last Session        Heart Failure questions reviewed, pt reported weight gain, increased fatigue when doing activities, has swelling from feet to knees.  He denied any Afib symptoms.    Thoracic impedance continues to be abnormal suggesting fluid accumulation since 08/01/2016.  AT/AF episode in progress since 10/16/16.          Prescribed dosage: Furosemide 80 mg 1 tablet daily. Per Dr Jacalyn Lefevre office visit note, 04/27/2016, patient may take an additional 40 mg daily for weight gain of 2 pounds.    He reports taking all of the other medications as prescribed and has not missed any dosages.    Labs: 06/02/2016 Creatinine 1.16, BUN 18, Potassium 4.7, Sodium 137 04/06/2016 Creatinine 0.92, BUN 19, Potassium 4.2, Sodium 138 09/25/2017Creatinine 1.31, BUN 19, Potassium 5.0, Sodium 134  Recommendations:  Pt has PCP appointment today due to fluid retention.  He reported taking Furosemide 80 mg 2 tablets every other day alternating with 1 1/2 tablets every other day.  He is unsure which physician instructed him to take that dosage and was not able to find any instructions advising him to take this dosage.      Follow-up plan: ICM clinic phone appointment on 11/20/2016 to recheck fluid levels.    Copy of ICM check sent to Dr. [name] and Dr. Dr [name] for review and recommendations.   Message sent to Oneita Jolly, FNP that patient's device is showing fluid accumulation that correlates with his  symptoms since patient has office appointment with him today.   3 month ICM trend: 11/12/2016   AT/AF    1 Year ICM trend:      [name], RN 11/12/2016 12:03 PM

## 2016-11-12 NOTE — Patient Instructions (Signed)
Thank you for choosing [facility].  SUMMARY AND INSTRUCTIONS:  Please increase the furosemide to 3 tablets daily for 4 days then back to 2 tablets daily.  We will check your blood work today.   Follow up with cardiology.  Medication:  Your prescription(s) have been submitted to your pharmacy or been printed and provided for you. Please take as directed and contact our office if you believe you are having problem(s) with the medication(s) or have any questions.  Labs:  Please stop by the lab on the lower level of the building for your blood work. Your results will be released to [place] (or called to you) after review, usually within 72 hours after test completion. If any changes need to be made, you will be notified at that same time.  1.) The lab is open from 7:30am to 5:30 pm Monday-Friday 2.) No appointment is necessary 3.) Fasting (if needed) is 6-8 hours after food and drink; black coffee and water are okay   Follow up:  If your symptoms worsen or fail to improve, please contact our office for further instruction, or in case of emergency go directly to the emergency room at the closest medical facility.

## 2016-11-12 NOTE — Assessment & Plan Note (Signed)
New-onset bilateral lower extremity edema with weight gain and occasional shortness of breath with activity. Was noted to have increased fluid levels around his defibrillator. Per patient does not appear to be responding to current dosage of furosemide. Increase furosemide. Obtain CMET and BNP. If no response may need IV diuretics for diuresis. Advised to follow up with cardiology. Continue to monitor.

## 2016-11-12 NOTE — Progress Notes (Signed)
Subjective:    Patient ID: [name] [name], male    DOB: 1950-06-14, 66 y.o.   MRN: [id number]  Chief Complaint  Patient presents with  . Leg Swelling    having swelling in legs and ankles, states he noticed it within the last couple of weeks    HPI:  [name] [name] is a 66 y.o. male who  has a past medical history of AICD (automatic cardioverter/defibrillator) present (02/05/2014); Atrial flutter ([name]) (04/2012); CAD (coronary artery disease) (2725,3664 X 2 ); CHF (congestive heart failure) ([place]); Chronic anticoagulation; Chronic systolic heart failure (HCC); CKD (chronic kidney disease); DM type 2 (diabetes mellitus, type 2) ([place]); HTN (hypertension); Hypercholesteremia; ICD (implantable cardiac defibrillator) in place; Ischemic cardiomyopathy (March 2015); Nephrolithiasis; and Ventricular tachycardia ([place]). and presents today for an office visit.  This is a new problem. Associated symptoms of edema located in his bilateral legs and ankles have been going on for a couple of weeks. Was noted to have fluid acculumlation around defibrillator during his most recent ultrasound. Denies pain. Currently prescribed 80 mg of furosemide. Notes that he is not urinating as much as he used to with the furosemide. Sodium intake is down. Not elevating legs very often. No chest pain. Does have occasional shortness of breath with increased activity.  Wt Readings from Last 3 Encounters:  11/12/16 247 lb (112 kg)  09/24/16 233 lb 6.4 oz (105.9 kg)  06/25/16 224 lb (101.6 kg)     Allergies  Allergen Reactions  . Penicillins Other (See Comments)    Unknown reaction      Outpatient Medications Prior to Visit  Medication Sig Dispense Refill  . apixaban (ELIQUIS) 5 MG TABS tablet Take 1 tablet (5 mg total) by mouth 2 (two) times daily. 60 tablet 6  . atorvastatin (LIPITOR) 80 MG tablet TAKE ONE TABLET BY MOUTH ONCE DAILY 90 tablet 3  . carvedilol (COREG) 3.125 MG tablet     . Dulaglutide  (TRULICITY) 4.03 KV/4.2VZ SOPN Inject 0.75 mg into the skin once a week. 4 pen 2  . gabapentin (NEURONTIN) 300 MG capsule Take 1 tablet in the morning and afternoon and 2 tablets at bedtime 360 capsule 3  . gabapentin (NEURONTIN) 600 MG tablet Take 1 tablet (600 mg total) by mouth 3 (three) times daily. 90 tablet 5  . insulin NPH-regular Human (NOVOLIN 70/30 RELION) (70-30) 100 UNIT/ML injection Inject 30 Units into the skin 2 (two) times daily with a meal. 10 mL 12  . losartan (COZAAR) 25 MG tablet Take 1 tablet (25 mg total) by mouth daily. 90 tablet 3  . magnesium oxide (MAG-OX) 400 MG tablet Take 1 tablet (400 mg total) by mouth daily. 90 tablet 3  . metFORMIN (GLUCOPHAGE-XR) 750 MG 24 hr tablet TAKE TWO TABLETS BY MOUTH ONCE DAILY WITH  BREAKFAST 180 tablet 1  . Multiple Vitamins-Minerals (MULTIVITAMIN PO) Take 1 capsule by mouth daily.     . sildenafil (REVATIO) 20 MG tablet Take 1-5 tablets (20-100 mg total) by mouth daily as needed. 50 tablet 0  . sotalol (BETAPACE) 120 MG tablet TAKE ONE TABLET BY MOUTH TWICE DAILY 180 tablet 3  . furosemide (LASIX) 80 MG tablet TAKE ONE TABLET BY MOUTH ONCE DAILY 135 tablet 3   No facility-administered medications prior to visit.      Past Medical History:  Diagnosis Date  . AICD (automatic cardioverter/defibrillator) present 02/05/2014   Upgrade to Medtronic biventricular ICD, serial number  [id number]   . Atrial flutter (  [place]) 04/2012   s/p TEE-EPS+RFCA 04/2012  . CAD (coronary artery disease) 1324,4010 X 2    RCA-T, 70% PL (off CFX), 99% Prox LAD/90% Dist LAD, S/P TAXUS stent x 2  . CHF (congestive heart failure) ([place])   . Chronic anticoagulation   . Chronic systolic heart failure ([place])   . CKD (chronic kidney disease)   . DM type 2 (diabetes mellitus, type 2) ([facility])    insulin dependent  . HTN (hypertension)   . Hypercholesteremia    ablation  . ICD (implantable cardiac defibrillator) in place   . Ischemic cardiomyopathy March 2015    20-25% 2D   . Nephrolithiasis   . Ventricular tachycardia ([place])       Review of Systems  Constitutional: Negative for chills and fever.  Respiratory: Positive for shortness of breath. Negative for chest tightness and wheezing.   Cardiovascular: Positive for leg swelling. Negative for chest pain and palpitations.      Objective:    BP 108/68 (BP Location: Left Arm, Patient Position: Sitting, Cuff Size: Large)   Pulse 78   Temp 97.8 F (36.6 C) (Oral)   Resp 16   Ht '5\' 10"'$  (1.778 m)   Wt 247 lb (112 kg)   SpO2 95%   BMI 35.44 kg/m  Nursing note and vital signs reviewed.  Physical Exam  Constitutional: He is oriented to person, place, and time. He appears well-developed and well-nourished. No distress.  Cardiovascular: Normal rate, regular rhythm, normal heart sounds and intact distal pulses.  Exam reveals no gallop and no friction rub.   No murmur heard. Moderate to severe 1-2+ pitting edema located in bilateral extremities. Negative for calf pain and Homan's sign.   Pulmonary/Chest: Effort normal and breath sounds normal. No respiratory distress. He has no wheezes. He has no rales. He exhibits no tenderness.  Neurological: He is alert and oriented to person, place, and time.  Skin: Skin is warm and dry.  Psychiatric: He has a normal mood and affect. His behavior is normal. Judgment and thought content normal.       Assessment & Plan:   Problem List Items Addressed This Visit      Other   Bilateral lower extremity edema - Primary    New-onset bilateral lower extremity edema with weight gain and occasional shortness of breath with activity. Was noted to have increased fluid levels around his defibrillator. Per patient does not appear to be responding to current dosage of furosemide. Increase furosemide. Obtain CMET and BNP. If no response may need IV diuretics for diuresis. Advised to follow up with cardiology. Continue to monitor.        Relevant Orders   B Nat Peptide  (Completed)   Comp Met (CMET) (Completed)    Other Visit Diagnoses    Need for influenza vaccination       Relevant Orders   Flu vaccine HIGH DOSE PF (Fluzone High dose) (Completed)       I have changed Mr. [place]ine furosemide. I am also having him maintain his magnesium oxide, Multiple Vitamins-Minerals (MULTIVITAMIN PO), apixaban, insulin NPH-regular Human, sildenafil, losartan, carvedilol, metFORMIN, gabapentin, Dulaglutide, gabapentin, atorvastatin, and sotalol.   Meds ordered this encounter  Medications  . furosemide (LASIX) 80 MG tablet    Sig: Take 3 tablets by mouth daily for 4 days then 2 tablets by mouth daily.    Dispense:  72 tablet    Refill:  0    Order Specific Question:   Supervising Provider  Answer:   Pricilla Holm A [[id number]]     Follow-up: Return if symptoms worsen or fail to improve.  Mauricio Po, FNP

## 2016-11-13 ENCOUNTER — Telehealth: Payer: Self-pay | Admitting: Internal Medicine

## 2016-11-13 ENCOUNTER — Encounter: Payer: Self-pay | Admitting: Cardiology

## 2016-11-13 NOTE — Progress Notes (Signed)
Attempted call to patient to follow up on outcome of PCP visit. No answer.

## 2016-11-13 NOTE — Telephone Encounter (Signed)
Patient called about concerns regarding fluid overload.  Patient is 66 y/o with hx of CAD, CHF, systolic dysfunction 2/2 to ischemic cardiomyopathy EF of 20-25%, Aflutter on chronic anticoagulation s/p ablation, VT. His primary cardiologist is Dr. [name]. Patient was talking 80mg , Qday with an additional 40mg  every other day. He was last seen by Dr. [name] in Feb of 2018. He claims that since June he's been having increased lower extremity swelling. He has gained significant weight and recently went to see his PCP yesterday 11/12/2016. Patient was asked to increase his lasix dose to 3 x 80mg , Qday. Patient called today stating that he took 5 pills, but did not see any significant improvement in his symptoms. He currently weighs 243lbs. Prior weight was b/w 224-233 lbs.   He will monitor for tonight and if he doesn't urinate adequately or symptoms worsen I recommended he come to the ER and be admitted for IV diuresis in the hospital. He agreed and will come to the ER is symptoms don't improve by tomorrow AM.   Cr on most recent blood test was 1.49 (above his baseline) most likely 2/2 to cardiorenal.

## 2016-11-16 ENCOUNTER — Encounter: Payer: Self-pay | Admitting: Family

## 2016-11-18 ENCOUNTER — Emergency Department (HOSPITAL_COMMUNITY): Payer: PPO

## 2016-11-18 ENCOUNTER — Encounter (HOSPITAL_COMMUNITY): Payer: Self-pay | Admitting: *Deleted

## 2016-11-18 ENCOUNTER — Inpatient Hospital Stay (HOSPITAL_COMMUNITY)
Admission: EM | Admit: 2016-11-18 | Discharge: 2016-11-27 | DRG: 291 | Disposition: A | Discharge status: Home / Self Care | Payer: PPO | Attending: Internal Medicine | Admitting: Internal Medicine

## 2016-11-18 ENCOUNTER — Telehealth: Payer: Self-pay | Admitting: Cardiology

## 2016-11-18 DIAGNOSIS — I10 Essential (primary) hypertension: Secondary | ICD-10-CM | POA: Diagnosis not present

## 2016-11-18 DIAGNOSIS — Z794 Long term (current) use of insulin: Secondary | ICD-10-CM

## 2016-11-18 DIAGNOSIS — I509 Heart failure, unspecified: Secondary | ICD-10-CM | POA: Diagnosis not present

## 2016-11-18 DIAGNOSIS — E876 Hypokalemia: Secondary | ICD-10-CM | POA: Diagnosis not present

## 2016-11-18 DIAGNOSIS — E871 Hypo-osmolality and hyponatremia: Secondary | ICD-10-CM | POA: Diagnosis not present

## 2016-11-18 DIAGNOSIS — I2510 Atherosclerotic heart disease of native coronary artery without angina pectoris: Secondary | ICD-10-CM | POA: Diagnosis not present

## 2016-11-18 DIAGNOSIS — I440 Atrioventricular block, first degree: Secondary | ICD-10-CM | POA: Diagnosis present

## 2016-11-18 DIAGNOSIS — Z95810 Presence of automatic (implantable) cardiac defibrillator: Secondary | ICD-10-CM

## 2016-11-18 DIAGNOSIS — Z955 Presence of coronary angioplasty implant and graft: Secondary | ICD-10-CM | POA: Diagnosis not present

## 2016-11-18 DIAGNOSIS — Z7901 Long term (current) use of anticoagulants: Secondary | ICD-10-CM

## 2016-11-18 DIAGNOSIS — I130 Hypertensive heart and chronic kidney disease with heart failure and stage 1 through stage 4 chronic kidney disease, or unspecified chronic kidney disease: Secondary | ICD-10-CM | POA: Diagnosis not present

## 2016-11-18 DIAGNOSIS — J9811 Atelectasis: Secondary | ICD-10-CM | POA: Diagnosis present

## 2016-11-18 DIAGNOSIS — E1140 Type 2 diabetes mellitus with diabetic neuropathy, unspecified: Secondary | ICD-10-CM | POA: Diagnosis not present

## 2016-11-18 DIAGNOSIS — N183 Chronic kidney disease, stage 3 (moderate): Secondary | ICD-10-CM | POA: Diagnosis not present

## 2016-11-18 DIAGNOSIS — G459 Transient cerebral ischemic attack, unspecified: Secondary | ICD-10-CM | POA: Diagnosis present

## 2016-11-18 DIAGNOSIS — I272 Pulmonary hypertension, unspecified: Secondary | ICD-10-CM | POA: Diagnosis present

## 2016-11-18 DIAGNOSIS — I340 Nonrheumatic mitral (valve) insufficiency: Secondary | ICD-10-CM | POA: Diagnosis not present

## 2016-11-18 DIAGNOSIS — E1122 Type 2 diabetes mellitus with diabetic chronic kidney disease: Secondary | ICD-10-CM | POA: Diagnosis not present

## 2016-11-18 DIAGNOSIS — D696 Thrombocytopenia, unspecified: Secondary | ICD-10-CM | POA: Diagnosis not present

## 2016-11-18 DIAGNOSIS — I5082 Biventricular heart failure: Secondary | ICD-10-CM | POA: Diagnosis present

## 2016-11-18 DIAGNOSIS — I252 Old myocardial infarction: Secondary | ICD-10-CM | POA: Diagnosis not present

## 2016-11-18 DIAGNOSIS — Z8249 Family history of ischemic heart disease and other diseases of the circulatory system: Secondary | ICD-10-CM

## 2016-11-18 DIAGNOSIS — I4892 Unspecified atrial flutter: Secondary | ICD-10-CM | POA: Diagnosis not present

## 2016-11-18 DIAGNOSIS — E118 Type 2 diabetes mellitus with unspecified complications: Secondary | ICD-10-CM | POA: Diagnosis not present

## 2016-11-18 DIAGNOSIS — Z87891 Personal history of nicotine dependence: Secondary | ICD-10-CM

## 2016-11-18 DIAGNOSIS — E11319 Type 2 diabetes mellitus with unspecified diabetic retinopathy without macular edema: Secondary | ICD-10-CM | POA: Diagnosis present

## 2016-11-18 DIAGNOSIS — I255 Ischemic cardiomyopathy: Secondary | ICD-10-CM | POA: Diagnosis present

## 2016-11-18 DIAGNOSIS — Z7984 Long term (current) use of oral hypoglycemic drugs: Secondary | ICD-10-CM

## 2016-11-18 DIAGNOSIS — Z8679 Personal history of other diseases of the circulatory system: Secondary | ICD-10-CM | POA: Diagnosis not present

## 2016-11-18 DIAGNOSIS — N182 Chronic kidney disease, stage 2 (mild): Secondary | ICD-10-CM | POA: Diagnosis not present

## 2016-11-18 DIAGNOSIS — E785 Hyperlipidemia, unspecified: Secondary | ICD-10-CM | POA: Diagnosis not present

## 2016-11-18 DIAGNOSIS — I480 Paroxysmal atrial fibrillation: Secondary | ICD-10-CM | POA: Diagnosis present

## 2016-11-18 DIAGNOSIS — E873 Alkalosis: Secondary | ICD-10-CM | POA: Diagnosis present

## 2016-11-18 DIAGNOSIS — Z833 Family history of diabetes mellitus: Secondary | ICD-10-CM

## 2016-11-18 DIAGNOSIS — Z880 Allergy status to penicillin: Secondary | ICD-10-CM

## 2016-11-18 DIAGNOSIS — R0602 Shortness of breath: Secondary | ICD-10-CM | POA: Diagnosis not present

## 2016-11-18 DIAGNOSIS — I5043 Acute on chronic combined systolic (congestive) and diastolic (congestive) heart failure: Secondary | ICD-10-CM | POA: Diagnosis present

## 2016-11-18 DIAGNOSIS — R05 Cough: Secondary | ICD-10-CM | POA: Diagnosis not present

## 2016-11-18 HISTORY — DX: Type 2 diabetes mellitus with unspecified diabetic retinopathy without macular edema: E11.319

## 2016-11-18 LAB — BASIC METABOLIC PANEL
Anion gap: 8 (ref 5–15)
BUN: 31 mg/dL — AB (ref 6–20)
CALCIUM: 9 mg/dL (ref 8.9–10.3)
CO2: 25 mmol/L (ref 22–32)
CREATININE: 1.38 mg/dL — AB (ref 0.61–1.24)
Chloride: 103 mmol/L (ref 101–111)
GFR calc Af Amer: 60 mL/min — ABNORMAL LOW (ref >60)
GFR, EST NON AFRICAN AMERICAN: 52 mL/min — AB (ref >60)
GLUCOSE: 189 mg/dL — AB (ref 65–99)
Potassium: 3.7 mmol/L (ref 3.5–5.1)
SODIUM: 136 mmol/L (ref 135–145)

## 2016-11-18 LAB — CBC
HEMATOCRIT: 32.8 % — AB (ref 39.0–52.0)
Hemoglobin: 10.4 g/dL — ABNORMAL LOW (ref 13.0–17.0)
MCH: 27.8 pg (ref 26.0–34.0)
MCHC: 31.7 g/dL (ref 30.0–36.0)
MCV: 87.7 fL (ref 78.0–100.0)
PLATELETS: 149 10*3/uL — AB (ref 150–400)
RBC: 3.74 MIL/uL — ABNORMAL LOW (ref 4.22–5.81)
RDW: 15.3 % (ref 11.5–15.5)
WBC: 4.1 10*3/uL (ref 4.0–10.5)

## 2016-11-18 LAB — I-STAT TROPONIN, ED
Troponin i, poc: 0 ng/mL (ref 0.00–0.08)
Troponin i, poc: 0.02 ng/mL (ref 0.00–0.08)

## 2016-11-18 LAB — GLUCOSE, CAPILLARY: Glucose-Capillary: 200 mg/dL — ABNORMAL HIGH (ref 65–99)

## 2016-11-18 LAB — HEMOGLOBIN A1C
Hgb A1c MFr Bld: 7.5 % — ABNORMAL HIGH (ref 4.8–5.6)
Mean Plasma Glucose: 168.55 mg/dL

## 2016-11-18 LAB — BRAIN NATRIURETIC PEPTIDE: B Natriuretic Peptide: 584.3 pg/mL — ABNORMAL HIGH (ref 0.0–100.0)

## 2016-11-18 MED ORDER — APIXABAN 5 MG PO TABS
5.0000 mg | ORAL_TABLET | Freq: Two times a day (BID) | ORAL | Status: DC
  Administered 2016-11-18 – 2016-11-27 (×18): 5 mg via ORAL
  Filled 2016-11-18 (×18): qty 1

## 2016-11-18 MED ORDER — SODIUM CHLORIDE 0.9 % IV SOLN: 250.0000 mL | INTRAVENOUS | Status: DC | PRN

## 2016-11-18 MED ORDER — INSULIN ASPART 100 UNIT/ML ~~LOC~~ SOLN
0.0000 [IU] | Freq: Every day | SUBCUTANEOUS | Status: DC
  Administered 2016-11-25: 2 [IU] via SUBCUTANEOUS

## 2016-11-18 MED ORDER — FUROSEMIDE 10 MG/ML IJ SOLN
80.0000 mg | Freq: Once | INTRAMUSCULAR | Status: AC
  Administered 2016-11-18: 80 mg via INTRAVENOUS
  Filled 2016-11-18: qty 8

## 2016-11-18 MED ORDER — SODIUM CHLORIDE 0.9% FLUSH
3.0000 mL | Freq: Two times a day (BID) | INTRAVENOUS | Status: DC
  Administered 2016-11-18 – 2016-11-24 (×12): 3 mL via INTRAVENOUS

## 2016-11-18 MED ORDER — INSULIN ASPART 100 UNIT/ML ~~LOC~~ SOLN
0.0000 [IU] | Freq: Three times a day (TID) | SUBCUTANEOUS | Status: DC
  Administered 2016-11-19 (×2): 2 [IU] via SUBCUTANEOUS
  Administered 2016-11-21 – 2016-11-22 (×2): 1 [IU] via SUBCUTANEOUS
  Administered 2016-11-23 (×2): 2 [IU] via SUBCUTANEOUS
  Administered 2016-11-24: 1 [IU] via SUBCUTANEOUS
  Administered 2016-11-25: 3 [IU] via SUBCUTANEOUS
  Administered 2016-11-25: 1 [IU] via SUBCUTANEOUS
  Administered 2016-11-25 – 2016-11-26 (×2): 2 [IU] via SUBCUTANEOUS
  Administered 2016-11-26 (×2): 3 [IU] via SUBCUTANEOUS
  Administered 2016-11-27: 2 [IU] via SUBCUTANEOUS
  Administered 2016-11-27: 1 [IU] via SUBCUTANEOUS

## 2016-11-18 MED ORDER — SOTALOL HCL 120 MG PO TABS
120.0000 mg | ORAL_TABLET | Freq: Two times a day (BID) | ORAL | Status: DC
  Administered 2016-11-18 – 2016-11-27 (×17): 120 mg via ORAL
  Filled 2016-11-18 (×19): qty 1

## 2016-11-18 MED ORDER — ACETAMINOPHEN 325 MG PO TABS: 650.0000 mg | ORAL_TABLET | ORAL | Status: DC | PRN

## 2016-11-18 MED ORDER — MAGNESIUM OXIDE 400 (241.3 MG) MG PO TABS
400.0000 mg | ORAL_TABLET | Freq: Every day | ORAL | Status: DC
  Administered 2016-11-19 – 2016-11-27 (×9): 400 mg via ORAL
  Filled 2016-11-18 (×9): qty 1

## 2016-11-18 MED ORDER — INSULIN ASPART PROT & ASPART (70-30 MIX) 100 UNIT/ML ~~LOC~~ SUSP
30.0000 [IU] | Freq: Two times a day (BID) | SUBCUTANEOUS | Status: DC
  Administered 2016-11-19 – 2016-11-24 (×8): 30 [IU] via SUBCUTANEOUS
  Filled 2016-11-18: qty 10

## 2016-11-18 MED ORDER — FUROSEMIDE 10 MG/ML IJ SOLN
80.0000 mg | Freq: Two times a day (BID) | INTRAMUSCULAR | Status: DC
  Administered 2016-11-18 – 2016-11-22 (×8): 80 mg via INTRAVENOUS
  Filled 2016-11-18 (×8): qty 8

## 2016-11-18 MED ORDER — ATORVASTATIN CALCIUM 80 MG PO TABS
80.0000 mg | ORAL_TABLET | Freq: Every day | ORAL | Status: DC
  Administered 2016-11-19 – 2016-11-27 (×9): 80 mg via ORAL
  Filled 2016-11-18 (×9): qty 1

## 2016-11-18 MED ORDER — SODIUM CHLORIDE 0.9% FLUSH: 3.0000 mL | INTRAVENOUS | Status: DC | PRN

## 2016-11-18 MED ORDER — ONDANSETRON HCL 4 MG/2ML IJ SOLN: 4.0000 mg | Freq: Four times a day (QID) | INTRAMUSCULAR | Status: DC | PRN

## 2016-11-18 MED ORDER — MAGNESIUM OXIDE 400 MG PO TABS: 400.0000 mg | ORAL_TABLET | Freq: Every day | ORAL | Status: DC

## 2016-11-18 MED ORDER — DULAGLUTIDE 0.75 MG/0.5ML ~~LOC~~ SOAJ: 0.7500 mg | SUBCUTANEOUS | Status: DC

## 2016-11-18 NOTE — ED Provider Notes (Signed)
The patient is a 66 year old male, history of ischemic cardiomyopathy, systolic congestive heart failure, history of ventricular tachycardia status post defibrillator placement, known to have hypertension diabetes hyperlipidemia and chronic kidney disease. His ejection fraction has been noted to be below 25%.  The patient has had a progressive peripheral edema which has progressed over the last couple of months however in the last couple of weeks it has become more intense and he has gained 8 pounds, he states that his legs are significantly swollen and he has dyspnea on exertion. He denies feeling orthopnea.  On exam the patient has clear heart and lung sounds, no obvious murmurs, significant peripheral edema up to the tops of his legs but most pronounced below the knees. It is symmetrical and there is no erythema of the skin.  Labs and testing reviewed, BNP is elevated over 500, creatinine is elevated over 1.3, the patient has been taking large doses of Lasix without improvement and with minimal diuresis-  Will admit for IV diuresis  Medical screening examination/treatment/procedure(s) were conducted as a shared visit with non-physician practitioner(s) and myself.  I personally evaluated the patient during the encounter.  Clinical Impression:   Final diagnoses:  Acute on chronic congestive heart failure, unspecified heart failure type (HCC)        Noemi Chapel, MD 11/27/16 [phone]

## 2016-11-18 NOTE — ED Triage Notes (Signed)
Pt reports not feeling well with weakness, fluid retention and sob. Pt was told to increase his lasix which he has done with no improvement. No acute resp distress is noted, ekg done at triage.

## 2016-11-18 NOTE — Telephone Encounter (Signed)
New Message   Heading to the ER - PCP told him to go to er  Pt c/o swelling: STAT is pt has developed SOB within 24 hours  1. How long have you been experiencing swelling?  Awhile pt states he put on 30lbs since June   2. Where is the swelling located? All over - put on 8lbs since last Thursday   3.  Are you currently taking a "fluid pill"? Yes   4.  Are you currently SOB?  Yes   5.  Have you traveled recently? No   The only thing he has done differently was get flu shot, now he has a cough and iregular heart beat

## 2016-11-18 NOTE — Telephone Encounter (Signed)
Called patient back. Patient is at the ED and has agreed to go in and have them evaluate him. Patient stated his PCP had him take 3 lasix 80 mg tablets last week that he has been dealing with extra fluid with an 8 lbs weight gain in one week. Patient stated the extra lasix did not do much for him. Patient complained of SOB and irregular heart beat. Informed patient that the ED is the right place to be, and to go ahead inside to get evaluated. Patient verbalized understanding.

## 2016-11-18 NOTE — Telephone Encounter (Signed)
Tried to call pt phone just keeps ringing. 

## 2016-11-18 NOTE — H&P (Signed)
History and Physical    [name] [name] TKW:[id number] DOB: 1950/03/27 DOA: 11/18/2016   PCP: Golden Circle, FNP   Attending physician: Marily Memos  Patient coming from/Resides with: Private residence/wife  Chief Complaint: Shortness of breath, lower extremity edema and increased weight  HPI: [name] [name] is a 66 y.o. male with medical history significant for atrial flutter on chronic anticoagulation, status post Medtronic biventricular ICD for CRT, history of CAD with drug-eluting stents, diabetes on insulin, hypertension, and chronic ischemic cardiomyopathy with left and right systolic dysfunction and mild diastolic dysfunction.patient presents to the ER complaining of dyspnea on exertion, orthopnea, fluid retention as well as resting shortness of breath. He was evaluated by PCP on 9/13 with increase in Lasix dosage for 3 days without any improvement in urinary output or decrease in weight. Of note his weight in March was 223 pounds and now he weighs 247 pounds. BMP in the ER mildly elevated at 584. Chest x-ray did not show CHF the patient does have faint expiratory crackles from the mid fields down on posterior exam. He is maintaining O2 saturations greater than 92% on room air at rest.  ED Course:  Vital Signs: BP (!) 96/55   Pulse (!) 56   Temp 97.7 F (36.5 C) (Oral)   Resp 16   SpO2 97%  2 view CXR: Mild bronchitic changes with bibasilar atelectasis Lab data: Sodium 136, potassium 3.7, chloride 103, CO2 25, glucose 1, BUN 31, creatinine 1.38, calcium 9.8, anion gap 8, BNP 584, POC troponin 0.02, white count 4100 differential not obtained, hemoglobin 10.4, platelets 149,000 Medications and treatments: Lasix 80 mg IV 1  Review of Systems:  In addition to the HPI above,  No Fever-chills, myalgias or other constitutional symptoms No Headache, changes with Vision or hearing, new weakness, tingling, numbness in any extremity, dizziness, dysarthria or word finding difficulty,  gait disturbance or imbalance, tremors or seizure activity No problems swallowing food or Liquids, indigestion/reflux, choking or coughing while eating, abdominal pain with or after eating No Chest pain, palpitations No Abdominal pain, N/V, melena,hematochezia, dark tarry stools, constipation No dysuria, malodorous urine, hematuria or flank pain No new skin rashes, lesions, masses or bruises, No new joint pains, aches, swelling or redness No recent unintentional weight loss No polyuria, polydypsia or polyphagia   Past Medical History:  Diagnosis Date  . AICD (automatic cardioverter/defibrillator) present 02/05/2014   Upgrade to Medtronic biventricular ICD, serial number  [id number]   . Atrial flutter ([place]) 04/2012   s/p TEE-EPS+RFCA 04/2012  . CAD (coronary artery disease) 9242,6834 X 2    RCA-T, 70% PL (off CFX), 99% Prox LAD/90% Dist LAD, S/P TAXUS stent x 2  . CHF (congestive heart failure) ([place])   . Chronic anticoagulation   . Chronic systolic heart failure ([place])   . CKD (chronic kidney disease)   . DM type 2 (diabetes mellitus, type 2) ([facility])    insulin dependent  . HTN (hypertension)   . Hypercholesteremia    ablation  . ICD (implantable cardiac defibrillator) in place   . Ischemic cardiomyopathy March 2015   20-25% 2D   . Nephrolithiasis   . Ventricular tachycardia [facility])     Past Surgical History:  Procedure Laterality Date  . ATRIAL FLUTTER ABLATION N/A 05/19/2012   Procedure: ATRIAL FLUTTER ABLATION;  Surgeon: Thompson Grayer, MD;  Location: [facility] CATH LAB;  Service: Cardiovascular;  Laterality: N/A;  . BI-VENTRICULAR IMPLANTABLE CARDIOVERTER DEFIBRILLATOR UPGRADE N/A 02/05/2014   Procedure: BI-VENTRICULAR IMPLANTABLE CARDIOVERTER DEFIBRILLATOR  UPGRADE;  Surgeon: Evans Lance, MD;  Location: [facility] CATH LAB;  Service: Cardiovascular;  Laterality: N/A;  . BIV ICD GENERTAOR CHANGE OUT  02/05/2014   Upgrade to Medtronic biventricular ICD, serial number  [id number] by Dr. [name]    . CARDIAC DEFIBRILLATOR PLACEMENT  2007    Medtronic Maximo VR, serial number [id number]  . PERCUTANEOUS CORONARY STENT INTERVENTION (PCI-S)  January 2002   PTCA/Stent Distal RCA  . PERCUTANEOUS CORONARY STENT INTERVENTION (PCI-S)  June 2002   PTCA/Stent x 3 RCA, thrombolysis - failed  . PERCUTANEOUS CORONARY STENT INTERVENTION (PCI-S)  July 2006   TAXUS stents to prox and distal LAD    Social History   Social History  . Marital status: Married    Spouse name: N/A  . Number of children: 4  . Years of education: 14   Occupational History  . brick layer - currently unemployed    Social History Main Topics  . Smoking status: Former Smoker    Packs/day: 1.00    Years: 29.00    Types: Cigarettes    Quit date: 03/02/2000  . Smokeless tobacco: Never Used  . Alcohol use No  . Drug use: No  . Sexual activity: Not on file   Other Topics Concern  . Not on file   Social History Narrative   Pt lives with wife in a one story home - married for 69 years. He hunts regularly and is active, walking > 1 mile at times without difficulty.  Has 4 children.     Retired Horticulturist, commercial.  Education: some college.    Mobility: Independent Work history: Retired   Allergies  Allergen Reactions  . Penicillins Other (See Comments)    Unknown reaction    Family History  Problem Relation Age of Onset  . Heart failure Father        Deceased  . Alzheimer's disease Mother        Living  . Heart attack Mother   . CAD Brother   . CAD Brother   . CAD Brother   . CAD Brother   . Healthy Son   . Healthy Daughter   . Diabetes Brother      Prior to Admission medications   Medication Sig Start Date End Date Taking? Authorizing Provider  apixaban (ELIQUIS) 5 MG TABS tablet Take 1 tablet (5 mg total) by mouth 2 (two) times daily. 04/09/15   Lelon Perla, MD  atorvastatin (LIPITOR) 80 MG tablet TAKE ONE TABLET BY MOUTH ONCE DAILY 07/31/16   Lelon Perla, MD  carvedilol (COREG) 3.125 MG  tablet  05/11/16   [provider]  Dulaglutide (TRULICITY) 4.01 UU/7.2ZD SOPN Inject 0.75 mg into the skin once a week. 06/25/16   Golden Circle, FNP  furosemide (LASIX) 80 MG tablet Take 3 tablets by mouth daily for 4 days then 2 tablets by mouth daily. 11/12/16   Golden Circle, FNP  gabapentin (NEURONTIN) 300 MG capsule Take 1 tablet in the morning and afternoon and 2 tablets at bedtime 06/23/16   Narda Amber K, DO  gabapentin (NEURONTIN) 600 MG tablet Take 1 tablet (600 mg total) by mouth 3 (three) times daily. 06/25/16   Narda Amber K, DO  insulin NPH-regular Human (NOVOLIN 70/30 RELION) (70-30) 100 UNIT/ML injection Inject 30 Units into the skin 2 (two) times daily with a meal. 12/26/15   Calone, Ples Specter, FNP  losartan (COZAAR) 25 MG tablet Take 1 tablet (25 mg total) by  mouth daily. 06/01/16   Lelon Perla, MD  magnesium oxide (MAG-OX) 400 MG tablet Take 1 tablet (400 mg total) by mouth daily. 06/01/12   Richardson Dopp T, PA-C  metFORMIN (GLUCOPHAGE-XR) 750 MG 24 hr tablet TAKE TWO TABLETS BY MOUTH ONCE DAILY WITH  BREAKFAST 06/19/16   Golden Circle, FNP  Multiple Vitamins-Minerals (MULTIVITAMIN PO) Take 1 capsule by mouth daily.     [provider]  sildenafil (REVATIO) 20 MG tablet Take 1-5 tablets (20-100 mg total) by mouth daily as needed. 01/08/16   Golden Circle, FNP  sotalol (BETAPACE) 120 MG tablet TAKE ONE TABLET BY MOUTH TWICE DAILY 07/31/16   Lelon Perla, MD    Physical Exam: Vitals:   11/18/16 0839 11/18/16 1211  BP: 98/73 (!) 96/55  Pulse: 60 (!) 56  Resp: 16 16  Temp: (!) 97.3 F (36.3 C) 97.7 F (36.5 C)  TempSrc: Oral Oral  SpO2: 99% 97%      Constitutional: NAD, calm, comfortable Eyes: PERRL, lids and conjunctivae normal ENMT: Mucous membranes are moist. Posterior pharynx clear of any exudate or lesions. Age appropriate dentition.  Neck: normal, supple, no masses, no thyromegaly Respiratory: Bilateral expiratory crackles  mid fields to bases. Normal respiratory effort is out accessory muscle use at rest. RA Cardiovascular: Regular rate and rhythm, no murmurs / rubs / gallops. 2+ bilateral lower extremity edema that extends up to both thighs. 2+ pedal pulses. No carotid bruits.  Abdomen: no tenderness, no masses palpated. No hepatosplenomegaly. Bowel sounds positive. Abdomen distended Musculoskeletal: no clubbing / cyanosis. No joint deformity upper and lower extremities. Good ROM, no contractures. Normal muscle tone.  Skin: no rashes, lesions, ulcers. No induration Neurologic: CN 2-12 grossly intact. Sensation intact, DTR normal. Strength 5/5 x all 4 extremities.  Psychiatric: Normal judgment and insight. Alert and oriented x 3. Normal mood.    Labs on Admission: I have personally reviewed following labs and imaging studies  CBC:  Recent Labs Lab 11/18/16 0914  WBC 4.1  HGB 10.4*  HCT 32.8*  MCV 87.7  PLT 809*   Basic Metabolic Panel:  Recent Labs Lab 11/12/16 1559 11/18/16 0914  NA 138 136  K 4.2 3.7  CL 102 103  CO2 27 25  GLUCOSE 167* 189*  BUN 30* 31*  CREATININE 1.49 1.38*  CALCIUM 9.5 9.0   GFR: Estimated Creatinine Clearance: 66 mL/min (A) (by C-G formula based on SCr of 1.38 mg/dL (H)). Liver Function Tests:  Recent Labs Lab 11/12/16 1559  AST 15  ALT 13  ALKPHOS 94  BILITOT 1.1  PROT 7.6  ALBUMIN 3.6   No results for input(s): LIPASE, AMYLASE in the last 168 hours. No results for input(s): AMMONIA in the last 168 hours. Coagulation Profile: No results for input(s): INR, PROTIME in the last 168 hours. Cardiac Enzymes: No results for input(s): CKTOTAL, CKMB, CKMBINDEX, TROPONINI in the last 168 hours. BNP (last 3 results)  Recent Labs  11/12/16 1559  PROBNP 388.0*   HbA1C: No results for input(s): HGBA1C in the last 72 hours. CBG: No results for input(s): GLUCAP in the last 168 hours. Lipid Profile: No results for input(s): CHOL, HDL, LDLCALC, TRIG, CHOLHDL,  LDLDIRECT in the last 72 hours. Thyroid Function Tests: No results for input(s): TSH, T4TOTAL, FREET4, T3FREE, THYROIDAB in the last 72 hours. Anemia Panel: No results for input(s): VITAMINB12, FOLATE, FERRITIN, TIBC, IRON, RETICCTPCT in the last 72 hours. Urine analysis:    Component Value Date/Time   COLORURINE  YELLOW 05/22/2012 [place] 05/22/2012 1810   LABSPEC 1.014 05/22/2012 1810   PHURINE 5.0 05/22/2012 1810   GLUCOSEU NEGATIVE 05/22/2012 1810   HGBUR NEGATIVE 05/22/2012 1810   BILIRUBINUR NEGATIVE 05/22/2012 1810   KETONESUR NEGATIVE 05/22/2012 1810   PROTEINUR NEGATIVE 05/22/2012 1810   UROBILINOGEN 1.0 05/22/2012 1810   NITRITE NEGATIVE 05/22/2012 1810   LEUKOCYTESUR NEGATIVE 05/22/2012 1810   Sepsis Labs: @LABRCNTIP (procalcitonin:4,lacticidven:4) )No results found for this or any previous visit (from the past 240 hour(s)).   Radiological Exams on Admission: Dg Chest 2 View  Result Date: 11/18/2016 CLINICAL DATA:  Dry cough, shortness of Breath EXAM: CHEST  2 VIEW COMPARISON:  02/06/2014 FINDINGS: Left AICD remains in place, unchanged. Cardiomegaly. Mild vascular congestion and bibasilar atelectasis. Mild peribronchial thickening. No effusions or acute bony abnormality. IMPRESSION: Mild bronchitic changes, bibasilar atelectasis. Electronically Signed   By: Rolm Baptise M.D.   On: 11/18/2016 09:42    EKG: (Independently reviewed) Atrial sensed ventricular paced rhythm with prolonged PR interval, ventricular rate 60 bpm, QTC 516 ms in setting of underlying right bundle branch block  Assessment/Plan Principal Problem:   Acute on chronic left and right systolic w/ diastolic heart failure, NYHA class 2 /  Medtronic biventricular ICD, serial number  [id number]  -Patient presents with progressive dyspnea on exertion and shortness of breath, increasing lower extremity edema and a 24 pound plus weight gain noted elevated BNP consistent with acute heart failure  exacerbation -Lasix 80 mg IV every 12 hours -Blood pressure suboptimal so hold preadmission Cozaar and carvedilol -Last echocardiogram 2016 with an EF of 20-25% and associated RV systolic dysfunction-repeat echo this admission -Holding ACE inhibitor and beta blocker as above -Daily weights, strict I/O -Biventricular pacemaker- ?? CRT  Active Problems:   Thrombocytopenia  -New problem with platelets greater than 100,000 and no signs of bleeding -Follow CBC    Hx of atrial flutter -Currently maintaining ventricular paced rhythm with underlying first-degree AV block -Continue sotalol -Continue eliquis -CHADVASc=5    HTN (hypertension) -Current blood pressure suboptimal so holding carvedilol and Cozaar as above    CAD (coronary artery disease)-RCA-T, 70% PL (off CFX), 99% Prox LAD/90% Dist LAD, S/P TAXUS stent x 2 -Denies chest pain -Continue statin -Beta blocker on hold -Not on aspirin due to utilization of full dose anticoagulation    DM type 2 (diabetes mellitus, type 2)  -Current CBG is elevated between 160 and 671 -Continue Trulicity -Hold metformin -SSI -HgbA1c     DVT prophylaxis: Eliquis  Code Status: Full  Family Communication: Wife, daughter, son at bedside Disposition Plan: Home Consults called: None     ELLIS,ALLISON L. ANP-BC [facility] Pager [phone]   If 7PM-7AM, please contact night-coverage [email or web address] Password [email or web address]  11/18/2016, 3:29 PM

## 2016-11-18 NOTE — ED Provider Notes (Signed)
[facility] DEPT Provider Note   CSN: [id number] Arrival date & time: 11/18/16  2993     History   Chief Complaint Chief Complaint  Patient presents with  . Weakness  . Leg Swelling  . Shortness of Breath    HPI KOUA DEEG is a 66 y.o. male with a pmhx of systolic CHF 2/2 to ischemic cardiomyopathy, EF 20-25%, CKD, DM, AICD, A flutter on chronic anticoagulation, s/p cardiac ablation who presented to the ED today complaining of SOb and leg swelling. Pt states that he has been experiencing increased fluid retention in his b/l LE and around his defibrillator since June but worsening in the last 2 weeks. Pt feels more short of breath on exertion. He has been taking lasix 80 mg/day plus an additional 40 mg every other day. However, given his worsening his symptoms, his primary care provider increased his dose to 80 mg 3x/d. Pt tells me that he has taken multiple pills without any relief. He also notes an 8lb weight gain and states that his urine output has been minimal. He does not feel that the lasix is working as well as it used to for him. He denies any chest pain or increased salt intake.  HPI  Past Medical History:  Diagnosis Date  . AICD (automatic cardioverter/defibrillator) present 02/05/2014   Upgrade to Medtronic biventricular ICD, serial number  [id number]   . Atrial flutter ([place]) 04/2012   s/p TEE-EPS+RFCA 04/2012  . CAD (coronary artery disease) 7169,6789 X 2    RCA-T, 70% PL (off CFX), 99% Prox LAD/90% Dist LAD, S/P TAXUS stent x 2  . CHF (congestive heart failure) ([place])   . Chronic anticoagulation   . Chronic systolic heart failure ([place])   . CKD (chronic kidney disease)   . DM type 2 (diabetes mellitus, type 2) (HCC)    insulin dependent  . HTN (hypertension)   . Hypercholesteremia    ablation  . ICD (implantable cardiac defibrillator) in place   . Ischemic cardiomyopathy March 2015   20-25% 2D   . Nephrolithiasis   . Ventricular tachycardia [facility])      Patient Active Problem List   Diagnosis Date Noted  . Bilateral lower extremity edema 11/12/2016  . Diabetic polyneuropathy associated with diabetes mellitus due to underlying condition ([place]) 05/29/2015  . Chronic systolic heart failure ([place]) 02/05/2014  . CAD (coronary artery disease), native coronary artery 01/08/2014  . Chronic renal disease, stage III 01/08/2014  . Heart failure ([place]) 12/16/2013  . Mitral regurgitation 04/17/2013  . Chronic systolic dysfunction of left ventricle 06/29/2012  . Atrial flutter ([place]) 06/03/2012  . Acute kidney injury (nontraumatic) ([place]) 05/31/2012  . Chronic anticoagulation   . Acute on chronic systolic heart failure ([place]) 05/21/2012  . Nausea with vomiting 05/18/2012  . PAF (paroxysmal atrial fibrillation) ([place]) 05/17/2012  . Orthopnea 05/17/2012  . TIA (transient ischemic attack) 05/29/2010  . CHEST PAIN 11/13/2008  . VENTRICULAR TACHYCARDIA 08/14/2008  . Automatic implantable cardioverter-defibrillator in situ 08/14/2008  . Type 2 diabetes with nephropathy ([place]) 08/11/2008  . DYSLIPIDEMIA 08/11/2008  . Cardiomyopathy, ischemic 08/11/2008  . Disorder resulting from impaired renal function 08/11/2008  . HYPERCHOLESTEROLEMIA 04/29/2006  . HYPERTENSION, BENIGN SYSTEMIC 04/29/2006  . MYOCARDIAL INFARCTION, OLD 04/29/2006  . Coronary atherosclerosis 04/29/2006  . NEPHROLITHIASIS 04/29/2006    Past Surgical History:  Procedure Laterality Date  . ATRIAL FLUTTER ABLATION N/A 05/19/2012   Procedure: ATRIAL FLUTTER ABLATION;  Surgeon: Thompson Grayer, MD;  Location[facility] CATH LAB;  Service: Cardiovascular;  Laterality: N/A;  . BI-VENTRICULAR IMPLANTABLE CARDIOVERTER DEFIBRILLATOR UPGRADE N/A 02/05/2014   Procedure: BI-VENTRICULAR IMPLANTABLE CARDIOVERTER DEFIBRILLATOR UPGRADE;  Surgeon: Evans Lance, MD;  Location: [facility] CATH LAB;  Service: Cardiovascular;  Laterality: N/A;  . BIV ICD GENERTAOR CHANGE OUT  02/05/2014   Upgrade to Medtronic biventricular  ICD, serial number  [id number] by Dr. [name]  . CARDIAC DEFIBRILLATOR PLACEMENT  2007    Medtronic Maximo VR, serial number [id number]  . PERCUTANEOUS CORONARY STENT INTERVENTION (PCI-S)  January 2002   PTCA/Stent Distal RCA  . PERCUTANEOUS CORONARY STENT INTERVENTION (PCI-S)  June 2002   PTCA/Stent x 3 RCA, thrombolysis - failed  . PERCUTANEOUS CORONARY STENT INTERVENTION (PCI-S)  July 2006   TAXUS stents to prox and distal LAD       Home Medications    Prior to Admission medications   Medication Sig Start Date End Date Taking? Authorizing Provider  apixaban (ELIQUIS) 5 MG TABS tablet Take 1 tablet (5 mg total) by mouth 2 (two) times daily. 04/09/15   Lelon Perla, MD  atorvastatin (LIPITOR) 80 MG tablet TAKE ONE TABLET BY MOUTH ONCE DAILY 07/31/16   Lelon Perla, MD  carvedilol (COREG) 3.125 MG tablet  05/11/16   [provider]  Dulaglutide (TRULICITY) 4.09 BD/5.3GD SOPN Inject 0.75 mg into the skin once a week. 06/25/16   Golden Circle, FNP  furosemide (LASIX) 80 MG tablet Take 3 tablets by mouth daily for 4 days then 2 tablets by mouth daily. 11/12/16   Golden Circle, FNP  gabapentin (NEURONTIN) 300 MG capsule Take 1 tablet in the morning and afternoon and 2 tablets at bedtime 06/23/16   Narda Amber K, DO  gabapentin (NEURONTIN) 600 MG tablet Take 1 tablet (600 mg total) by mouth 3 (three) times daily. 06/25/16   Narda Amber K, DO  insulin NPH-regular Human (NOVOLIN 70/30 RELION) (70-30) 100 UNIT/ML injection Inject 30 Units into the skin 2 (two) times daily with a meal. 12/26/15   Calone, Ples Specter, FNP  losartan (COZAAR) 25 MG tablet Take 1 tablet (25 mg total) by mouth daily. 06/01/16   Lelon Perla, MD  magnesium oxide (MAG-OX) 400 MG tablet Take 1 tablet (400 mg total) by mouth daily. 06/01/12   Richardson Dopp T, PA-C  metFORMIN (GLUCOPHAGE-XR) 750 MG 24 hr tablet TAKE TWO TABLETS BY MOUTH ONCE DAILY WITH  BREAKFAST 06/19/16   Golden Circle, FNP   Multiple Vitamins-Minerals (MULTIVITAMIN PO) Take 1 capsule by mouth daily.     [provider]  sildenafil (REVATIO) 20 MG tablet Take 1-5 tablets (20-100 mg total) by mouth daily as needed. 01/08/16   Golden Circle, FNP  sotalol (BETAPACE) 120 MG tablet TAKE ONE TABLET BY MOUTH TWICE DAILY 07/31/16   Lelon Perla, MD    Family History Family History  Problem Relation Age of Onset  . Heart failure Father        Deceased  . Alzheimer's disease Mother        Living  . Heart attack Mother   . CAD Brother   . CAD Brother   . CAD Brother   . CAD Brother   . Healthy Son   . Healthy Daughter   . Diabetes Brother     Social History Social History  Substance Use Topics  . Smoking status: Former Smoker    Packs/day: 1.00    Years: 29.00    Types: Cigarettes    Quit date:  03/02/2000  . Smokeless tobacco: Never Used  . Alcohol use No     Allergies   Penicillins   Review of Systems Review of Systems  All other systems reviewed and are negative.    Physical Exam Updated Vital Signs BP (!) 96/55   Pulse (!) 56   Temp 97.7 F (36.5 C) (Oral)   Resp 16   SpO2 97%   Physical Exam  Constitutional: He is oriented to person, place, and time. He appears well-developed and well-nourished. No distress.  HENT:  Head: Normocephalic and atraumatic.  Mouth/Throat: No oropharyngeal exudate.  Eyes: Pupils are equal, round, and reactive to light. Conjunctivae and EOM are normal. Right eye exhibits no discharge. Left eye exhibits no discharge. No scleral icterus.  Cardiovascular: Normal rate, regular rhythm, normal heart sounds and intact distal pulses.  Exam reveals no gallop and no friction rub.   No murmur heard. 3+ pitting edema in bilateral lower extremities Mild fluid retention noted around the AICD  Pulmonary/Chest: Effort normal and breath sounds normal. No respiratory distress. He has no wheezes. He has no rales. He exhibits no tenderness.  Abdominal: Soft.  He exhibits no distension. There is no tenderness. There is no guarding.  Musculoskeletal: Normal range of motion. He exhibits edema.  Neurological: He is alert and oriented to person, place, and time.  Skin: Skin is warm and dry. No rash noted. He is not diaphoretic. No erythema. No pallor.  Psychiatric: He has a normal mood and affect. His behavior is normal.  Nursing note and vitals reviewed.    ED Treatments / Results  Labs (all labs ordered are listed, but only abnormal results are displayed) Labs Reviewed  BASIC METABOLIC PANEL - Abnormal; Notable for the following:       Result Value   Glucose, Bld 189 (*)    BUN 31 (*)    Creatinine, Ser 1.38 (*)    GFR calc non Af Amer 52 (*)    GFR calc Af Amer 60 (*)    All other components within normal limits  CBC - Abnormal; Notable for the following:    RBC 3.74 (*)    Hemoglobin 10.4 (*)    HCT 32.8 (*)    Platelets 149 (*)    All other components within normal limits  BRAIN NATRIURETIC PEPTIDE - Abnormal; Notable for the following:    B Natriuretic Peptide 584.3 (*)    All other components within normal limits  I-STAT TROPONIN, ED  I-STAT TROPONIN, ED    EKG  EKG Interpretation None       Radiology Dg Chest 2 View  Result Date: 11/18/2016 CLINICAL DATA:  Dry cough, shortness of Breath EXAM: CHEST  2 VIEW COMPARISON:  02/06/2014 FINDINGS: Left AICD remains in place, unchanged. Cardiomegaly. Mild vascular congestion and bibasilar atelectasis. Mild peribronchial thickening. No effusions or acute bony abnormality. IMPRESSION: Mild bronchitic changes, bibasilar atelectasis. Electronically Signed   By: Rolm Baptise M.D.   On: 11/18/2016 09:42    Procedures Procedures (including critical care time)  Medications Ordered in ED Medications  furosemide (LASIX) injection 80 mg (80 mg Intravenous Given 11/18/16 1429)     Initial Impression / Assessment and Plan / ED Course  I have reviewed the triage vital signs and the  nursing notes.  Pertinent labs & imaging results that were available during my care of the patient were reviewed by me and considered in my medical decision making (see chart for details).     66 year old  male with history of ischemic cardiomyopathy, CHF with an ejection fraction of 20% presented to the ED today complaining of weight gain, increased fluid retention and shortness of breath. He has increased his home Lasix to 80 mg 3 times a day without any relief of his symptoms and he is still gained 8 pounds. Per chart review is primary care doctor sent him here for IV diuresis and admission for continued IV diuresis. On exam he has significant fluid retention in his bilateral lower extremities and mild fluid collection around his defibrillator. I cannot appreciate any diffuse anasarca. His BNP is mildly elevated chest x-ray shows mild bronchitic changes with bibasilar atelectasis. Given his minimal response to home IV diuresis I feel that he would require ongoing IV diuresis as an inpatient. He may also need a medication change to another diuretic as this one seems to not be working as it used to.  Spoke with hospitalist who will consult patient in the ED and admit.  Case discussed with Dr. Sabra Heck who agrees with treatment plan.  Final Clinical Impressions(s) / ED Diagnoses   Final diagnoses:  None    New Prescriptions New Prescriptions   No medications on file     Carlos Levering, PA-C 11/18/16 1523    Noemi Chapel, MD 11/27/16 [phone]

## 2016-11-19 ENCOUNTER — Observation Stay (HOSPITAL_COMMUNITY): Payer: PPO

## 2016-11-19 DIAGNOSIS — I2510 Atherosclerotic heart disease of native coronary artery without angina pectoris: Secondary | ICD-10-CM | POA: Diagnosis present

## 2016-11-19 DIAGNOSIS — I272 Pulmonary hypertension, unspecified: Secondary | ICD-10-CM | POA: Diagnosis present

## 2016-11-19 DIAGNOSIS — Z7901 Long term (current) use of anticoagulants: Secondary | ICD-10-CM | POA: Diagnosis not present

## 2016-11-19 DIAGNOSIS — E785 Hyperlipidemia, unspecified: Secondary | ICD-10-CM | POA: Diagnosis present

## 2016-11-19 DIAGNOSIS — I5082 Biventricular heart failure: Secondary | ICD-10-CM | POA: Diagnosis present

## 2016-11-19 DIAGNOSIS — E873 Alkalosis: Secondary | ICD-10-CM | POA: Diagnosis present

## 2016-11-19 DIAGNOSIS — Z955 Presence of coronary angioplasty implant and graft: Secondary | ICD-10-CM | POA: Diagnosis not present

## 2016-11-19 DIAGNOSIS — E118 Type 2 diabetes mellitus with unspecified complications: Secondary | ICD-10-CM | POA: Diagnosis not present

## 2016-11-19 DIAGNOSIS — I340 Nonrheumatic mitral (valve) insufficiency: Secondary | ICD-10-CM

## 2016-11-19 DIAGNOSIS — I255 Ischemic cardiomyopathy: Secondary | ICD-10-CM | POA: Diagnosis present

## 2016-11-19 DIAGNOSIS — Z8679 Personal history of other diseases of the circulatory system: Secondary | ICD-10-CM | POA: Diagnosis not present

## 2016-11-19 DIAGNOSIS — E1140 Type 2 diabetes mellitus with diabetic neuropathy, unspecified: Secondary | ICD-10-CM | POA: Diagnosis present

## 2016-11-19 DIAGNOSIS — I130 Hypertensive heart and chronic kidney disease with heart failure and stage 1 through stage 4 chronic kidney disease, or unspecified chronic kidney disease: Secondary | ICD-10-CM | POA: Diagnosis present

## 2016-11-19 DIAGNOSIS — Z95810 Presence of automatic (implantable) cardiac defibrillator: Secondary | ICD-10-CM | POA: Diagnosis not present

## 2016-11-19 DIAGNOSIS — I10 Essential (primary) hypertension: Secondary | ICD-10-CM | POA: Diagnosis not present

## 2016-11-19 DIAGNOSIS — E11319 Type 2 diabetes mellitus with unspecified diabetic retinopathy without macular edema: Secondary | ICD-10-CM | POA: Diagnosis present

## 2016-11-19 DIAGNOSIS — N182 Chronic kidney disease, stage 2 (mild): Secondary | ICD-10-CM | POA: Diagnosis not present

## 2016-11-19 DIAGNOSIS — D696 Thrombocytopenia, unspecified: Secondary | ICD-10-CM | POA: Diagnosis present

## 2016-11-19 DIAGNOSIS — I5043 Acute on chronic combined systolic (congestive) and diastolic (congestive) heart failure: Secondary | ICD-10-CM | POA: Diagnosis present

## 2016-11-19 DIAGNOSIS — E876 Hypokalemia: Secondary | ICD-10-CM | POA: Diagnosis present

## 2016-11-19 DIAGNOSIS — I480 Paroxysmal atrial fibrillation: Secondary | ICD-10-CM | POA: Diagnosis present

## 2016-11-19 DIAGNOSIS — I4892 Unspecified atrial flutter: Secondary | ICD-10-CM | POA: Diagnosis present

## 2016-11-19 DIAGNOSIS — N183 Chronic kidney disease, stage 3 (moderate): Secondary | ICD-10-CM | POA: Diagnosis present

## 2016-11-19 DIAGNOSIS — I252 Old myocardial infarction: Secondary | ICD-10-CM | POA: Diagnosis not present

## 2016-11-19 DIAGNOSIS — J9811 Atelectasis: Secondary | ICD-10-CM | POA: Diagnosis present

## 2016-11-19 DIAGNOSIS — Z87891 Personal history of nicotine dependence: Secondary | ICD-10-CM | POA: Diagnosis not present

## 2016-11-19 DIAGNOSIS — E1122 Type 2 diabetes mellitus with diabetic chronic kidney disease: Secondary | ICD-10-CM | POA: Diagnosis present

## 2016-11-19 DIAGNOSIS — G459 Transient cerebral ischemic attack, unspecified: Secondary | ICD-10-CM | POA: Diagnosis present

## 2016-11-19 DIAGNOSIS — E871 Hypo-osmolality and hyponatremia: Secondary | ICD-10-CM | POA: Diagnosis present

## 2016-11-19 LAB — ECHOCARDIOGRAM COMPLETE
AOASC: 34 cm
CHL CUP PV REG GRAD DIAS: 10 mmHg
EWDT: 145 ms
FS: 16 % — AB (ref 28–44)
HEIGHTINCHES: 71 in
IV/PV OW: 0.98
LA ID, A-P, ES: 49 mm
LA diam index: 2.03 cm/m2
LA vol A4C: 126 ml
LA vol index: 46.8 mL/m2
LA vol: 113 mL
LEFT ATRIUM END SYS DIAM: 49 mm
LVOT area: 3.46 cm2
LVOT diameter: 21 mm
MV Dec: 145
MV VTI: 171 cm
MV pk A vel: 34 m/s
MV pk E vel: 85.4 m/s
MVPG: 3 mmHg
PISA EROA: 0.13 cm2
PV Reg vel dias: 160 cm/s
PW: 12.9 mm — AB (ref 0.6–1.1)
RV TAPSE: 10.7 mm
RV sys press: 44 mmHg
Reg peak vel: 269 cm/s
TRMAXVEL: 269 cm/s
WEIGHTICAEL: 3980.8 [oz_av]

## 2016-11-19 LAB — BASIC METABOLIC PANEL
ANION GAP: 9 (ref 5–15)
BUN: 29 mg/dL — ABNORMAL HIGH (ref 6–20)
CALCIUM: 9.2 mg/dL (ref 8.9–10.3)
CHLORIDE: 103 mmol/L (ref 101–111)
CO2: 25 mmol/L (ref 22–32)
Creatinine, Ser: 1.31 mg/dL — ABNORMAL HIGH (ref 0.61–1.24)
GFR calc non Af Amer: 55 mL/min — ABNORMAL LOW (ref >60)
Glucose, Bld: 163 mg/dL — ABNORMAL HIGH (ref 65–99)
POTASSIUM: 4.4 mmol/L (ref 3.5–5.1)
Sodium: 137 mmol/L (ref 135–145)

## 2016-11-19 LAB — GLUCOSE, CAPILLARY
GLUCOSE-CAPILLARY: 176 mg/dL — AB (ref 65–99)
GLUCOSE-CAPILLARY: 94 mg/dL (ref 65–99)
Glucose-Capillary: 165 mg/dL — ABNORMAL HIGH (ref 65–99)
Glucose-Capillary: 127 mg/dL — ABNORMAL HIGH (ref 65–99)

## 2016-11-19 MED ORDER — BLISTEX MEDICATED EX OINT
TOPICAL_OINTMENT | CUTANEOUS | Status: DC | PRN
  Administered 2016-11-20: 04:00:00 via TOPICAL
  Filled 2016-11-19: qty 6.3

## 2016-11-19 MED ORDER — DIPHENHYDRAMINE-ZINC ACETATE 2-0.1 % EX CREA
TOPICAL_CREAM | Freq: Two times a day (BID) | CUTANEOUS | Status: DC | PRN
  Administered 2016-11-19: 1 via TOPICAL
  Filled 2016-11-19: qty 28

## 2016-11-19 MED ORDER — GABAPENTIN 300 MG PO CAPS
300.0000 mg | ORAL_CAPSULE | Freq: Three times a day (TID) | ORAL | Status: DC
  Administered 2016-11-19 (×2): 300 mg via ORAL
  Filled 2016-11-19 (×2): qty 1

## 2016-11-19 NOTE — Progress Notes (Signed)
[facility] PROGRESS NOTE  [name] [name] ZTI:[id number] DOB: 20-Nov-1950 DOA: 11/18/2016 PCP: Golden Circle, FNP  Interim summary and HPI 66 y.o. male with medical history significant for atrial flutter on chronic anticoagulation, status post Medtronic biventricular ICD for CRT, history of CAD with drug-eluting stents, diabetes on insulin, hypertension, and chronic ischemic cardiomyopathy with left and right systolic dysfunction and mild diastolic dysfunction.patient presents to the ER complaining of dyspnea on exertion, orthopnea, fluid retention as well as resting shortness of breath. He was evaluated by PCP on 9/13 with increase in Lasix dosage for 3 days without any improvement in urinary output or decrease in weight. Of note his weight in March was 223 pounds and now he weighs 247 pounds. Patient admitted fr further management of acute on chronic combined CHF.  Assessment/Plan: 1-acute on chronic combined CHF: patient status post biventricular ICD -last EF 20-25% and currently 15-20% -no CP -still with significant fluid on board -will continue IVF's -b-blocker and ARB on hold to have room on BP for diuresis in setting of acute decompensation and soft BP. Will resume in the next 24-48 hours -continue daily weight, strict I's and O's and low sodium diet  -apply TED hoses  2-thrombocytopenia -no signs of bleeding  -will monitor trend   3-hx of atrial flutter  -s/p biventricular ICD -rate controlled -continue sotalol and eliquis  -CHADsVASC score 5  4-HTN -BP is soft -will hold coreg and cozaar while actively diuresing him   5-hx of CAD: status post sten X 2 -no CP -troponin neg -will continue statins and resume ARB and B-blocker once BP/ acute CHF improved.  6-diabetes type 2 with neuropathy: on chronic insulin therapy -will continue home NPH and SSI -oral agent and trulicity on hold while inpatient  7-HLD -will continue statins   8-hx of pulmonary HTN -plan  is to resume home revatio once BP stable to tolerate it    Code Status: Full Family Communication: wife at bedside  Disposition Plan: remains inpatient, continue IV lasix, follow clinical response.   Consultants:  None   Procedures:  2-D echo - Left ventricle: The cavity size was mildly dilated. There was   moderate concentric hypertrophy. Systolic function was severely   reduced. The estimated ejection fraction was in the range of 15%   to 20%. Diffuse hypokinesis. - Ventricular septum: Septal motion showed paradox. - Aortic valve: Trileaflet; mildly thickened, mildly calcified   leaflets. Transvalvular velocity was within the normal range.   There was no stenosis. There was trivial regurgitation. - Mitral valve: There was moderate regurgitation. - Left atrium: The atrium was moderately dilated. - Right ventricle: The cavity size was mildly dilated. Wall   thickness was normal. Systolic function was moderately reduced. - Right atrium: The atrium was normal in size. - Tricuspid valve: There was moderate regurgitation. - Pulmonary arteries: Systolic pressure was moderately increased.   PA peak pressure: 44 mm Hg (S). - Line: A venous catheter was visualized in the superior vena cava,   with its tip in the right atrium. No abnormal features noted. - Pericardium, extracardiac: There was no pericardial effusion.  Impressions: - Since the last study on 02/11/15 LVEF has decreased from 20-25%   to 15-20%.  Antibiotics:  None   HPI/Subjective: Afebrile, no CP. Patient reporting some improvement in his breathing and orthopnea; still with signs of fluid overload.   Objective: Vitals:   11/19/16 0900 11/19/16 1213  BP: 98/64 114/70  Pulse: (!) 57 61  Resp: 18  18  Temp: 97.8 F (36.6 C) (!) 97.5 F (36.4 C)  SpO2: 97% 100%    Intake/Output Summary (Last 24 hours) at 11/19/16 1702 Last data filed at 11/19/16 1336  Gross per 24 hour  Intake              960 ml   Output             1525 ml  Net             -565 ml   Filed Weights   11/18/16 1810 11/19/16 0440  Weight: 112.8 kg (248 lb 11.2 oz) 112.9 kg (248 lb 12.8 oz)    Exam:   General:  Afebrile, reports some improvement in his breathing and denies CP. Still with signs of fluid overload.  Cardiovascular: S1 and S2, no rubs, no gallops, mild JVD on exam  Respiratory: fair air movement, positive bibasilar crackles, no wheezing, normal resp effort. Not requiring oxygen.  Abdomen: soft, NT, positive BS, no guarding   Musculoskeletal: 2-3++ edema bilaterally all the way to his thighs; no open wounds, no cyanosis   Data Reviewed: Basic Metabolic Panel:  Recent Labs Lab 11/18/16 0914 11/19/16 0216  NA 136 137  K 3.7 4.4  CL 103 103  CO2 25 25  GLUCOSE 189* 163*  BUN 31* 29*  CREATININE 1.38* 1.31*  CALCIUM 9.0 9.2   CBC:  Recent Labs Lab 11/18/16 0914  WBC 4.1  HGB 10.4*  HCT 32.8*  MCV 87.7  PLT 149*   BNP (last 3 results)  Recent Labs  11/18/16 1355  BNP 584.3*    ProBNP (last 3 results)  Recent Labs  11/12/16 1559  PROBNP 388.0*    CBG:  Recent Labs Lab 11/18/16 2126 11/19/16 0738 11/19/16 1209 11/19/16 1623  GLUCAP 200* 165* 176* 94    Studies: Dg Chest 2 View  Result Date: 11/18/2016 CLINICAL DATA:  Dry cough, shortness of Breath EXAM: CHEST  2 VIEW COMPARISON:  02/06/2014 FINDINGS: Left AICD remains in place, unchanged. Cardiomegaly. Mild vascular congestion and bibasilar atelectasis. Mild peribronchial thickening. No effusions or acute bony abnormality. IMPRESSION: Mild bronchitic changes, bibasilar atelectasis. Electronically Signed   By: Rolm Baptise M.D.   On: 11/18/2016 09:42    Scheduled Meds: . apixaban  5 mg Oral BID  . atorvastatin  80 mg Oral Daily  . furosemide  80 mg Intravenous BID  . insulin aspart  0-5 Units Subcutaneous QHS  . insulin aspart  0-9 Units Subcutaneous TID WC  . insulin aspart protamine- aspart  30 Units  Subcutaneous BID WC  . magnesium oxide  400 mg Oral Daily  . sodium chloride flush  3 mL Intravenous Q12H  . sotalol  120 mg Oral BID   Continuous Infusions: . sodium chloride      Time spent: 35 minutes   Barton Dubois  [facility] Pager [phone]. If 7PM-7AM, please contact night-coverage at [email or web address], password [facility] 11/19/2016, 5:02 PM  LOS: 0 days

## 2016-11-19 NOTE — Progress Notes (Signed)
  Echocardiogram 2D Echocardiogram has been performed.  [name] [name] 11/19/2016, 9:14 AM

## 2016-11-19 NOTE — Discharge Instructions (Signed)

## 2016-11-19 NOTE — Consult Note (Signed)
   [facility] CM Inpatient Consult   11/19/2016  [name] [name] 1951-02-17 [id number]  Patient screened for potential [place] Management services in with HF exacerbation. Patient is eligible for [facility] Care Management services under patient's [employer] plan.  Met with patient and his wife.  Patient states good follow up with his primary care provider Terri Piedra, FNP at [facility]  This office is listed to provide the transition of care call.  He denies any problems at this current time.  Patient will assigned to [facility] HF calls. A brochure, 24 hour nurse advise line magnet expalined and given along with contact information.  Please place a [facility] Care Management consult or for questions contact:   Natividad Brood, RN BSN [employer] Hospital Liaison  [phone] business mobile phone Toll free office [phone]

## 2016-11-20 LAB — BASIC METABOLIC PANEL
ANION GAP: 11 (ref 5–15)
BUN: 31 mg/dL — ABNORMAL HIGH (ref 6–20)
CALCIUM: 9.3 mg/dL (ref 8.9–10.3)
CHLORIDE: 105 mmol/L (ref 101–111)
CO2: 24 mmol/L (ref 22–32)
Creatinine, Ser: 1.44 mg/dL — ABNORMAL HIGH (ref 0.61–1.24)
GFR calc Af Amer: 57 mL/min — ABNORMAL LOW (ref >60)
GFR calc non Af Amer: 49 mL/min — ABNORMAL LOW (ref >60)
GLUCOSE: 108 mg/dL — AB (ref 65–99)
Potassium: 4.3 mmol/L (ref 3.5–5.1)
Sodium: 140 mmol/L (ref 135–145)

## 2016-11-20 LAB — GLUCOSE, CAPILLARY
GLUCOSE-CAPILLARY: 116 mg/dL — AB (ref 65–99)
GLUCOSE-CAPILLARY: 69 mg/dL (ref 65–99)
GLUCOSE-CAPILLARY: 97 mg/dL (ref 65–99)
Glucose-Capillary: 99 mg/dL (ref 65–99)
Glucose-Capillary: 132 mg/dL — ABNORMAL HIGH (ref 65–99)

## 2016-11-20 MED ORDER — GABAPENTIN 300 MG PO CAPS
600.0000 mg | ORAL_CAPSULE | Freq: Three times a day (TID) | ORAL | Status: DC
  Administered 2016-11-20 – 2016-11-27 (×22): 600 mg via ORAL
  Filled 2016-11-20 (×22): qty 2

## 2016-11-20 MED ORDER — METOLAZONE 2.5 MG PO TABS
2.5000 mg | ORAL_TABLET | Freq: Every day | ORAL | Status: AC
  Administered 2016-11-20 – 2016-11-21 (×2): 2.5 mg via ORAL
  Filled 2016-11-20 (×2): qty 1

## 2016-11-20 NOTE — Care Management Note (Signed)
Case Management Note  Patient Details  Name: [name] [name] MRN: [id number] Date of Birth: 02-20-1951  Subjective/Objective:   CHF                Action/Plan: Patient lives at home with his spouse; PCP: Golden Circle, FNP; has private insurance with [employer] with prescription drug coverage; CM will continue to follow for DCP  Expected Discharge Date:   possibly 11/23/2016               Expected Discharge Plan:  Home/Self Care  In-House Referral:   Dietitian   Discharge planning Services  CM Consult  Status of Service:  In process, will continue to follow  Sherrilyn Rist [phone] 11/20/2016, 4:09 PM

## 2016-11-20 NOTE — Progress Notes (Signed)
[facility] PROGRESS NOTE  [name] [name] JOI:[id number] DOB: 08-08-1950 DOA: 11/18/2016 PCP: Golden Circle, FNP  Interim summary and HPI 66 y.o. male with medical history significant for atrial flutter on chronic anticoagulation, status post Medtronic biventricular ICD for CRT, history of CAD with drug-eluting stents, diabetes on insulin, hypertension, and chronic ischemic cardiomyopathy with left and right systolic dysfunction and mild diastolic dysfunction.patient presents to the ER complaining of dyspnea on exertion, orthopnea, fluid retention as well as resting shortness of breath. He was evaluated by PCP on 9/13 with increase in Lasix dosage for 3 days without any improvement in urinary output or decrease in weight. Of note his weight in March was 223 pounds and now he weighs 247 pounds. Patient admitted fr further management of acute on chronic combined CHF.  Assessment/Plan: 1-acute on chronic combined CHF: patient status post biventricular ICD -last EF 20-25% and currently 15-20% -no CP -still with significant fluid on board; even much improved. -patient weight and I's and O's, no reflecting changes on physical exam. -will continue IV diuresis  -b-blocker and ARB on hold to have room on BP for diuresis in setting of acute decompensation and soft BP. Will resume in the next 24 hours if stable and compensated. -continue monitoring daily weight, strict I's and O's and low sodium diet  -will apply TED hoses 12 hours on and 12 off to help with swelling  2-thrombocytopenia -no signs of bleeding  -will monitor trend intermittently   3-hx of atrial flutter  -s/p biventricular ICD -rate has remained stable and controlled -continue sotalol and eliquis  -CHADsVASC score 5  4-HTN -BP remains soft -for now, will continue holding coreg and cozaar while actively diuresing him   5-hx of CAD: status post sten X 2 -no CP and troponin neg -will continue statins and resume ARB and  B-blocker once BP/ acute CHF improved.  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   Code Status: Full Family Communication: wife at bedside  Disposition Plan: remains inpatient, continue IV lasix, follow clinical response.   Consultants:  None   Procedures:  2-D echo - Left ventricle: The cavity size was mildly dilated. There was   moderate concentric hypertrophy. Systolic function was severely   reduced. The estimated ejection fraction was in the range of 15%   to 20%. Diffuse hypokinesis. - Ventricular septum: Septal motion showed paradox. - Aortic valve: Trileaflet; mildly thickened, mildly calcified   leaflets. Transvalvular velocity was within the normal range.   There was no stenosis. There was trivial regurgitation. - Mitral valve: There was moderate regurgitation. - Left atrium: The atrium was moderately dilated. - Right ventricle: The cavity size was mildly dilated. Wall   thickness was normal. Systolic function was moderately reduced. - Right atrium: The atrium was normal in size. - Tricuspid valve: There was moderate regurgitation. - Pulmonary arteries: Systolic pressure was moderately increased.   PA peak pressure: 44 mm Hg (S). - Line: A venous catheter was visualized in the superior vena cava,   with its tip in the right atrium. No abnormal features noted. - Pericardium, extracardiac: There was no pericardial effusion.  Impressions: - Since the last study on 02/11/15 LVEF has decreased from 20-25%   to 15-20%.  Antibiotics:  None   HPI/Subjective: Even still with signs of fluid overload; feeling much better and with decrease on swelling during examination. Denies, CP, nausea, vomiting and abd  pain.  Objective: Vitals:   11/20/16 0446 11/20/16 1411  BP: 104/69 96/68  Pulse: (!) 53 (!) 53  Resp: 18 18  Temp: (!) 97 F (36.1 C) 97.6 F (36.4 C)  SpO2: 100% 100%    Intake/Output Summary (Last 24 hours) at 11/20/16 1646 Last data filed at 11/20/16 1412  Gross per 24 hour  Intake             1040 ml  Output             1120 ml  Net              -80 ml   Filed Weights   11/18/16 1810 11/19/16 0440 11/20/16 0446  Weight: 112.8 kg (248 lb 11.2 oz) 112.9 kg (248 lb 12.8 oz) 112.7 kg (248 lb 8 oz)    Exam:   General:  Afebrile, feeling better and reporting further improvement in orthopnea and leg swelling. No CP, no nausea, no vomiting. Reporting neuropathic pain in his legs.  Cardiovascular: S1 and S2, no rubs, no gallops, no JVD appreciated on exam.  Respiratory: improved air movement, no frank crackles appreciated, normal resp effort  Abdomen: soft, NT, ND, positive BS   Musculoskeletal: 2++ edema bilaterally, no cyanosis, no open wounds.   Data Reviewed: Basic Metabolic Panel:  Recent Labs Lab 11/18/16 0914 11/19/16 0216 11/20/16 0404  NA 136 137 140  K 3.7 4.4 4.3  CL 103 103 105  CO2 25 25 24   GLUCOSE 189* 163* 108*  BUN 31* 29* 31*  CREATININE 1.38* 1.31* 1.44*  CALCIUM 9.0 9.2 9.3   CBC:  Recent Labs Lab 11/18/16 0914  WBC 4.1  HGB 10.4*  HCT 32.8*  MCV 87.7  PLT 149*   BNP (last 3 results)  Recent Labs  11/18/16 1355  BNP 584.3*    ProBNP (last 3 results)  Recent Labs  11/12/16 1559  PROBNP 388.0*    CBG:  Recent Labs Lab 11/19/16 1623 11/19/16 2218 11/20/16 0723 11/20/16 1136 11/20/16 1610  GLUCAP 94 127* 99 116* 69    Studies: No results found.  Scheduled Meds: . apixaban  5 mg Oral BID  . atorvastatin  80 mg Oral Daily  . furosemide  80 mg Intravenous BID  . gabapentin  600 mg Oral TID  . insulin aspart  0-5 Units Subcutaneous QHS  . insulin aspart  0-9 Units Subcutaneous TID WC  . insulin aspart  protamine- aspart  30 Units Subcutaneous BID WC  . magnesium oxide  400 mg Oral Daily  . sodium chloride flush  3 mL Intravenous Q12H  . sotalol  120 mg Oral BID   Continuous Infusions: . sodium chloride      Time spent: 35 minutes   Barton Dubois  [facility] Pager [phone]. If 7PM-7AM, please contact night-coverage at [email or web address], password [facility] 11/20/2016, 4:46 PM  LOS: 1 day

## 2016-11-20 NOTE — Plan of Care (Signed)
Problem: Food- and Nutrition-Related Knowledge Deficit (NB-1.1) Goal: Nutrition education Formal process to instruct or train a patient/client in a skill or to impart knowledge to help patients/clients voluntarily manage or modify food choices and eating behavior to maintain or improve health. Outcome: Completed/Met Date Met: 11/20/16 Nutrition Education Note  RD consulted for nutrition education regarding CHF. Discussed diet with pt, pt's wife and pt's niece.  RD provided "Heart Failure Nutrition Therapy" handout from the [employer].  Reviewed patient's dietary recall. Pt's wife prepares most meals however pt eats outside of the house 1 time/d. Pt reports eating at small diners typically for late breakfasts including eggs and abundance of breakfast meats.   Provided examples on ways to decrease sodium intake in diet. Discouraged intake of processed foods and use of salt shaker. Pt reports adding additional salt to foods, even french fries. Educated pt on other seasonings and flavorings to use to make food taste good without sodium.   Encouraged fresh fruits and vegetables as well as whole grain sources of carbohydrates to maximize fiber intake.   RD discussed why it is important for patient to adhere to diet recommendations, and emphasized the role of fluids, foods to avoid, and importance of weighing self daily. Pt reports he "wants to cry" every time he looks down at the fluid around his ankles.   Teach back method used.  Expect poor-fair compliance. Pt's wife is very on board and has already attempted making dietary changes for the both of them. Pt reports "I will do it for her" speaking about his wife. Pt reports he does not want to come back to the hospital and will try his best to make some changes.  Body mass index is 34.66 kg/m. Pt meets criteria for obese based on current BMI.  Current diet order is heart healthy/carb mod, patient is consuming approximately  75-100% of meals at this time. Labs and medications reviewed. No further nutrition interventions warranted at this time. RD contact information provided. If additional nutrition issues arise, please re-consult RD.   Parks Ranger, MS, RDN, LDN 11/20/2016 3:49 PM

## 2016-11-21 LAB — BASIC METABOLIC PANEL
ANION GAP: 10 (ref 5–15)
BUN: 33 mg/dL — ABNORMAL HIGH (ref 6–20)
CALCIUM: 9 mg/dL (ref 8.9–10.3)
CO2: 25 mmol/L (ref 22–32)
Chloride: 101 mmol/L (ref 101–111)
Creatinine, Ser: 1.37 mg/dL — ABNORMAL HIGH (ref 0.61–1.24)
GFR calc Af Amer: >60 mL/min (ref >60)
GFR, EST NON AFRICAN AMERICAN: 52 mL/min — AB (ref >60)
GLUCOSE: 84 mg/dL (ref 65–99)
POTASSIUM: 3.4 mmol/L — AB (ref 3.5–5.1)
SODIUM: 136 mmol/L (ref 135–145)

## 2016-11-21 LAB — GLUCOSE, CAPILLARY
GLUCOSE-CAPILLARY: 150 mg/dL — AB (ref 65–99)
Glucose-Capillary: 114 mg/dL — ABNORMAL HIGH (ref 65–99)
Glucose-Capillary: 130 mg/dL — ABNORMAL HIGH (ref 65–99)
Glucose-Capillary: 99 mg/dL (ref 65–99)

## 2016-11-21 MED ORDER — POTASSIUM CHLORIDE CRYS ER 20 MEQ PO TBCR
40.0000 meq | EXTENDED_RELEASE_TABLET | Freq: Every day | ORAL | Status: DC
  Administered 2016-11-21 – 2016-11-23 (×3): 40 meq via ORAL
  Filled 2016-11-21 (×3): qty 2

## 2016-11-21 MED ORDER — METOLAZONE 2.5 MG PO TABS
2.5000 mg | ORAL_TABLET | Freq: Every day | ORAL | Status: AC
  Administered 2016-11-22 – 2016-11-24 (×3): 2.5 mg via ORAL
  Filled 2016-11-21 (×3): qty 1

## 2016-11-21 NOTE — Progress Notes (Signed)
[facility] PROGRESS NOTE  YUREM VINER XBJ:[id number] DOB: Jan 01, 1951 DOA: 11/18/2016 PCP: [facility], FNP  Interim summary and HPI 66 y.o. male with medical history significant for atrial flutter on chronic anticoagulation, status post Medtronic biventricular ICD for CRT, history of CAD with drug-eluting stents, diabetes on insulin, hypertension, and chronic ischemic cardiomyopathy with left and right systolic dysfunction and mild diastolic dysfunction.patient presents to the ER complaining of dyspnea on exertion, orthopnea, fluid retention as well as resting shortness of breath. He was evaluated by PCP on 9/13 with increase in Lasix dosage for 3 days without any improvement in urinary output or decrease in weight. Of note his weight in March was 223 pounds and now he weighs 247 pounds. Patient admitted fr further management of acute on chronic combined CHF.  Assessment/Plan: 1-acute on chronic combined CHF: patient status post biventricular ICD -last EF 20-25% and currently 15-20% -no CP -still with significant fluid on board; even continue improving -continue checking patient's weight and I's and O's -Continue IV diuresis and use of metolazone  -continue b-blocker and ARB on hold to have room on BP for diuresis in setting of acute decompensation and soft BP. -will apply TED hoses 12 hours on and 12 off  And continue low sodium diet   2-thrombocytopenia -no signs of bleeding  -will monitor trend intermittently   3-hx of atrial flutter  -s/p biventricular ICD -rate has remained stable and controlled -continue sotalol and Eliquis  -CHADsVASC score 5  4-HTN -BP remains soft -for now, will continue holding coreg and cozaar while actively diuresing him   5-hx of CAD: status post sten X 2 -no CP and troponin neg -will continue statins and resume ARB and B-blocker once BP/ acute CHF improved and compensated again.  6-diabetes type 2 with neuropathy: on chronic insulin  therapy -continue home NPH and continue SSI  -will continue holding oral agents and trulicity   7-HLD -continue statins    8-hx of pulmonary HTN -plan is to resume home revatio once BP stable to tolerate it  -BP remains soft.  9-hypokalemia -associated with diuresis -will check Mg and replete electrolytes as needed   Code Status: Full Family Communication: wife at bedside  Disposition Plan: remains inpatient, continue IV lasix, follow clinical response.   Consultants:  None   Procedures:  2-D echo - Left ventricle: The cavity size was mildly dilated. There was   moderate concentric hypertrophy. Systolic function was severely   reduced. The estimated ejection fraction was in the range of 15%   to 20%. Diffuse hypokinesis. - Ventricular septum: Septal motion showed paradox. - Aortic valve: Trileaflet; mildly thickened, mildly calcified   leaflets. Transvalvular velocity was within the normal range.   There was no stenosis. There was trivial regurgitation. - Mitral valve: There was moderate regurgitation. - Left atrium: The atrium was moderately dilated. - Right ventricle: The cavity size was mildly dilated. Wall   thickness was normal. Systolic function was moderately reduced. - Right atrium: The atrium was normal in size. - Tricuspid valve: There was moderate regurgitation. - Pulmonary arteries: Systolic pressure was moderately increased.   PA peak pressure: 44 mm Hg (S). - Line: A venous catheter was visualized in the superior vena cava,   with its tip in the right atrium. No abnormal features noted. - Pericardium, extracardiac: There was no pericardial effusion.  Impressions: - Since the last study on 02/11/15 LVEF has decreased from 20-25%   to 15-20%.  Antibiotics:  None  HPI/Subjective: No CP, overall feeling better and with improved orthopnea and exercise capacity reported.  Objective: Vitals:   11/21/16 0500 11/21/16 0947  BP: 91/66 103/66   Pulse: (!) 54 (!) 55  Resp: 18   Temp: 97.9 F (36.6 C)   SpO2: 97% 97%    Intake/Output Summary (Last 24 hours) at 11/21/16 1124 Last data filed at 11/21/16 0950  Gross per 24 hour  Intake              780 ml  Output             2220 ml  Net            -1440 ml   Filed Weights   11/19/16 0440 11/20/16 0446 11/21/16 0500  Weight: 112.9 kg (248 lb 12.8 oz) 112.7 kg (248 lb 8 oz) 112.9 kg (248 lb 14.4 oz)    Exam:   General:  No fever, no nausea, no vomiting. Denies CP and reports improvement in orthopnea and leg swelling (even still pretty swollen). Patient reported increase in urine output.   Cardiovascular: S1 and S2, no rubs, no gallops, no JVD  Respiratory: stable air movement, normal resp effort, not requiring oxygen and without rhonchi or frank crackles.  Abdomen: soft, NT, ND, positive BS   Musculoskeletal: 2++ edema, no cyanosis , no clubbing    Data Reviewed: Basic Metabolic Panel:  Recent Labs Lab 11/18/16 0914 11/19/16 0216 11/20/16 0404 11/21/16 0452  NA 136 137 140 136  K 3.7 4.4 4.3 3.4*  CL 103 103 105 101  CO2 25 25 24 25   GLUCOSE 189* 163* 108* 84  BUN 31* 29* 31* 33*  CREATININE 1.38* 1.31* 1.44* 1.37*  CALCIUM 9.0 9.2 9.3 9.0   CBC:  Recent Labs Lab 11/18/16 0914  WBC 4.1  HGB 10.4*  HCT 32.8*  MCV 87.7  PLT 149*   BNP (last 3 results)  Recent Labs  11/18/16 1355  BNP 584.3*    ProBNP (last 3 results)  Recent Labs  11/12/16 1559  PROBNP 388.0*    CBG:  Recent Labs Lab 11/20/16 1136 11/20/16 1610 11/20/16 1651 11/20/16 2237 11/21/16 0743  GLUCAP 116* 69 97 132* 114*    Studies: No results found.  Scheduled Meds: . apixaban  5 mg Oral BID  . atorvastatin  80 mg Oral Daily  . furosemide  80 mg Intravenous BID  . gabapentin  600 mg Oral TID  . insulin aspart  0-5 Units Subcutaneous QHS  . insulin aspart  0-9 Units Subcutaneous TID WC  . insulin aspart protamine- aspart  30 Units Subcutaneous BID WC  .  magnesium oxide  400 mg Oral Daily  . metolazone  2.5 mg Oral Daily  . potassium chloride  40 mEq Oral Daily  . sodium chloride flush  3 mL Intravenous Q12H  . sotalol  120 mg Oral BID   Continuous Infusions: . sodium chloride      Time spent: 35 minutes   Barton Dubois  [facility] Pager [phone]. If 7PM-7AM, please contact night-coverage at [email or web address], password [facility] 11/21/2016, 11:24 AM  LOS: 2 days

## 2016-11-21 NOTE — Plan of Care (Signed)
Problem: Tissue Perfusion: Goal: Risk factors for ineffective tissue perfusion will decrease Outcome: Progressing O2 sat in the 90's on room air   Problem: Activity: Goal: Risk for activity intolerance will decrease Outcome: Progressing Ambulates in the hall frequently   Problem: Fluid Volume: Goal: Ability to maintain a balanced intake and output will improve Outcome: Progressing Continues to diurese

## 2016-11-21 NOTE — Progress Notes (Signed)
Pt. With 16 beat run VTach, text page to MD.

## 2016-11-22 LAB — MAGNESIUM: Magnesium: 1.9 mg/dL (ref 1.7–2.4)

## 2016-11-22 LAB — GLUCOSE, CAPILLARY
Glucose-Capillary: 136 mg/dL — ABNORMAL HIGH (ref 65–99)
Glucose-Capillary: 90 mg/dL (ref 65–99)
Glucose-Capillary: 118 mg/dL — ABNORMAL HIGH (ref 65–99)
Glucose-Capillary: 162 mg/dL — ABNORMAL HIGH (ref 65–99)

## 2016-11-22 LAB — BASIC METABOLIC PANEL
ANION GAP: 9 (ref 5–15)
BUN: 36 mg/dL — ABNORMAL HIGH (ref 6–20)
CALCIUM: 9.3 mg/dL (ref 8.9–10.3)
CO2: 28 mmol/L (ref 22–32)
CREATININE: 1.48 mg/dL — AB (ref 0.61–1.24)
Chloride: 97 mmol/L — ABNORMAL LOW (ref 101–111)
GFR calc Af Amer: 55 mL/min — ABNORMAL LOW (ref >60)
GFR, EST NON AFRICAN AMERICAN: 48 mL/min — AB (ref >60)
GLUCOSE: 137 mg/dL — AB (ref 65–99)
Potassium: 3.5 mmol/L (ref 3.5–5.1)
Sodium: 134 mmol/L — ABNORMAL LOW (ref 135–145)

## 2016-11-22 MED ORDER — FUROSEMIDE 10 MG/ML IJ SOLN
60.0000 mg | Freq: Three times a day (TID) | INTRAMUSCULAR | Status: DC
  Administered 2016-11-22 – 2016-11-24 (×6): 60 mg via INTRAVENOUS
  Filled 2016-11-22 (×6): qty 6

## 2016-11-22 NOTE — Progress Notes (Signed)
[facility] PROGRESS NOTE  [name] [name] FTD:[id number] DOB: June 01, 1950 DOA: 11/18/2016 PCP: [facility], FNP  Interim summary and HPI 66 y.o. male with medical history significant for atrial flutter on chronic anticoagulation, status post Medtronic biventricular ICD for CRT, history of CAD with drug-eluting stents, diabetes on insulin, hypertension, and chronic ischemic cardiomyopathy with left and right systolic dysfunction and mild diastolic dysfunction.patient presents to the ER complaining of dyspnea on exertion, orthopnea, fluid retention as well as resting shortness of breath. He was evaluated by PCP on 9/13 with increase in Lasix dosage for 3 days without any improvement in urinary output or decrease in weight. Of note his weight in March was 223 pounds and now he weighs 247 pounds. Patient admitted fr further management of acute on chronic combined CHF.  Assessment/Plan: 1-acute on chronic combined CHF: patient status post biventricular ICD -last EF 20-25% and currently 15-20% -no CP -still with significant fluid on board; even continue improving -continue checking patient's weight and I's and O's -Continue IV diuresis (with adjustment to Q8 hours for 1-2 days) and use of metolazone  -continue b-blocker and ARB on hold to have room on BP for diuresis in setting of acute decompensation and soft BP. -will continue use of TED hoses 12 hours on and 12 off  And continue low sodium diet   2-thrombocytopenia -no signs of bleeding  -will monitor trend intermittently   3-hx of atrial flutter  -s/p biventricular ICD -rate has remained stable and controlled -continue sotalol and Eliquis  -CHADsVASC score 5  4-HTN -BP remains soft -for now, will continue holding coreg and cozaar while actively diuresing him   5-hx of CAD: status post sten X 2 -no CP and troponin neg -will continue statins and resume ARB and B-blocker once BP/ acute CHF improved and is compensated  again.  6-diabetes type 2 with neuropathy: on chronic insulin therapy -continue home NPH and continue SSI  -will continue holding oral agents and trulicity   7-HLD -will continue statins     8-hx of pulmonary HTN -plan is to resume home revatio once BP stable to tolerate it  -BP remains soft, but stable.  9-hypokalemia/non sustained runs of V-tach -electrolytes abnormalities associated with diuresis -Mg WNL -will continue monitoring and repleting electrolytes as needed    Code Status: Full Family Communication: wife at bedside  Disposition Plan: remains inpatient, continue IV lasix and follow clinical response.   Consultants:  None   Procedures:  2-D echo - Left ventricle: The cavity size was mildly dilated. There was   moderate concentric hypertrophy. Systolic function was severely   reduced. The estimated ejection fraction was in the range of 15%   to 20%. Diffuse hypokinesis. - Ventricular septum: Septal motion showed paradox. - Aortic valve: Trileaflet; mildly thickened, mildly calcified   leaflets. Transvalvular velocity was within the normal range.   There was no stenosis. There was trivial regurgitation. - Mitral valve: There was moderate regurgitation. - Left atrium: The atrium was moderately dilated. - Right ventricle: The cavity size was mildly dilated. Wall   thickness was normal. Systolic function was moderately reduced. - Right atrium: The atrium was normal in size. - Tricuspid valve: There was moderate regurgitation. - Pulmonary arteries: Systolic pressure was moderately increased.   PA peak pressure: 44 mm Hg (S). - Line: A venous catheter was visualized in the superior vena cava,   with its tip in the right atrium. No abnormal features noted. - Pericardium, extracardiac: There was no pericardial  effusion.  Impressions: - Since the last study on 02/11/15 LVEF has decreased from 20-25%   to 15-20%.  Antibiotics:  None   HPI/Subjective: No CP,  no nausea, no vomiting, no abd pain. Patient still with fluid overload on exam; good urine output.  Objective: Vitals:   11/22/16 0501 11/22/16 1006  BP: 103/66 102/69  Pulse: (!) 51 (!) 55  Resp: 18   Temp: 97.9 F (36.6 C)   SpO2: 98% 95%    Intake/Output Summary (Last 24 hours) at 11/22/16 1101 Last data filed at 11/22/16 0945  Gross per 24 hour  Intake             1080 ml  Output             3400 ml  Net            -2320 ml   Filed Weights   11/20/16 0446 11/21/16 0500 11/22/16 0501  Weight: 112.7 kg (248 lb 8 oz) 112.9 kg (248 lb 14.4 oz) 111 kg (244 lb 11.2 oz)    Exam:   General:  Afrebile, no CP, breathing good and denies any acute distress. Still with fluid overload on exam and per weight approx 18-20 pounds above baseline.   Cardiovascular: S1 and S2, no rubs, no gallops, no JVD appreciated  Respiratory: good air movement, no wheezing, no crackles, no rhonchi   Abdomen: soft, NT, ND, positive BS   Musculoskeletal: still with 2++ edema, no cyanosis, no clubbing     Data Reviewed: Basic Metabolic Panel:  Recent Labs Lab 11/18/16 0914 11/19/16 0216 11/20/16 0404 11/21/16 0452 11/22/16 0612  NA 136 137 140 136 134*  K 3.7 4.4 4.3 3.4* 3.5  CL 103 103 105 101 97*  CO2 25 25 24 25 28   GLUCOSE 189* 163* 108* 84 137*  BUN 31* 29* 31* 33* 36*  CREATININE 1.38* 1.31* 1.44* 1.37* 1.48*  CALCIUM 9.0 9.2 9.3 9.0 9.3  MG  --   --   --   --  1.9   CBC:  Recent Labs Lab 11/18/16 0914  WBC 4.1  HGB 10.4*  HCT 32.8*  MCV 87.7  PLT 149*   BNP (last 3 results)  Recent Labs  11/18/16 1355  BNP 584.3*    ProBNP (last 3 results)  Recent Labs  11/12/16 1559  PROBNP 388.0*    CBG:  Recent Labs Lab 11/21/16 0743 11/21/16 1203 11/21/16 1635 11/21/16 2059 11/22/16 0732  GLUCAP 114* 130* 99 150* 136*    Studies: No results found.  Scheduled Meds: . apixaban  5 mg Oral BID  . atorvastatin  80 mg Oral Daily  . furosemide  60 mg  Intravenous TID  . gabapentin  600 mg Oral TID  . insulin aspart  0-5 Units Subcutaneous QHS  . insulin aspart  0-9 Units Subcutaneous TID WC  . insulin aspart protamine- aspart  30 Units Subcutaneous BID WC  . magnesium oxide  400 mg Oral Daily  . metolazone  2.5 mg Oral Daily  . potassium chloride  40 mEq Oral Daily  . sodium chloride flush  3 mL Intravenous Q12H  . sotalol  120 mg Oral BID   Continuous Infusions: . sodium chloride      Time spent: 35 minutes   Barton Dubois  [facility] Pager [phone]. If 7PM-7AM, please contact night-coverage at [email or web address], password [facility] 11/22/2016, 11:01 AM  LOS: 3 days

## 2016-11-22 NOTE — Progress Notes (Signed)
VSS during 7 a to 7 p shift, no complaints of pain or other.  PAtient ambulatory in the hallway multiple times, wearing TED hose.  Family at bedside.

## 2016-11-23 DIAGNOSIS — E876 Hypokalemia: Secondary | ICD-10-CM

## 2016-11-23 LAB — GLUCOSE, CAPILLARY
GLUCOSE-CAPILLARY: 156 mg/dL — AB (ref 65–99)
GLUCOSE-CAPILLARY: 149 mg/dL — AB (ref 65–99)
Glucose-Capillary: 112 mg/dL — ABNORMAL HIGH (ref 65–99)
Glucose-Capillary: 160 mg/dL — ABNORMAL HIGH (ref 65–99)

## 2016-11-23 LAB — BASIC METABOLIC PANEL
ANION GAP: 11 (ref 5–15)
BUN: 36 mg/dL — AB (ref 6–20)
CO2: 31 mmol/L (ref 22–32)
Calcium: 9.7 mg/dL (ref 8.9–10.3)
Chloride: 96 mmol/L — ABNORMAL LOW (ref 101–111)
Creatinine, Ser: 1.38 mg/dL — ABNORMAL HIGH (ref 0.61–1.24)
GFR calc Af Amer: 60 mL/min — ABNORMAL LOW (ref >60)
GFR calc non Af Amer: 52 mL/min — ABNORMAL LOW (ref >60)
GLUCOSE: 93 mg/dL (ref 65–99)
POTASSIUM: 2.8 mmol/L — AB (ref 3.5–5.1)
Sodium: 138 mmol/L (ref 135–145)

## 2016-11-23 MED ORDER — POTASSIUM CHLORIDE CRYS ER 20 MEQ PO TBCR
40.0000 meq | EXTENDED_RELEASE_TABLET | Freq: Two times a day (BID) | ORAL | Status: DC
  Administered 2016-11-23 – 2016-11-24 (×2): 40 meq via ORAL
  Filled 2016-11-23 (×2): qty 2

## 2016-11-23 MED ORDER — POTASSIUM CHLORIDE CRYS ER 20 MEQ PO TBCR
40.0000 meq | EXTENDED_RELEASE_TABLET | Freq: Once | ORAL | Status: AC
  Administered 2016-11-23: 40 meq via ORAL
  Filled 2016-11-23: qty 2

## 2016-11-23 NOTE — Progress Notes (Signed)
[facility] PROGRESS NOTE  DAREN YEAGLE UKG:[id number] DOB: 14-Jun-1950 DOA: 11/18/2016 PCP: Golden Circle, FNP  Interim summary and HPI 66 y.o. male with medical history significant for atrial flutter on chronic anticoagulation, status post Medtronic biventricular ICD for CRT, history of CAD with drug-eluting stents, diabetes on insulin, hypertension, and chronic ischemic cardiomyopathy with left and right systolic dysfunction and mild diastolic dysfunction.patient presents to the ER complaining of dyspnea on exertion, orthopnea, fluid retention as well as resting shortness of breath. He was evaluated by PCP on 9/13 with increase in Lasix dosage for 3 days without any improvement in urinary output or decrease in weight. Of note his weight in March was 223 pounds and now he weighs 247 pounds. Patient admitted fr further management of acute on chronic combined CHF.  Assessment/Plan: 1-acute on chronic combined CHF: patient status post biventricular ICD -last EF 20-25% and currently 15-20% -no CP -still with significant fluid on board; even continue improving -continue checking patient's weight and I's and O's -Continue IV diuresis (with adjustment to Q8 hours for and extra 24 hours and reassess need for aggressive diuresis) and use of metolazone. Renal function stable. -continue b-blocker and ARB on hold to have room on BP for diuresis in setting of acute decompensation and soft BP. -will continue use of TED hoses 12 hours on and 12 off  And continue low sodium diet   2-thrombocytopenia -no signs of bleeding  -will monitor trend intermittently -CBC in am   3-hx of atrial flutter  -s/p biventricular ICD -rate has remained stable and controlled -continue sotalol and Eliquis  -CHADsVASC score 5  4-HTN -BP remains soft, but stable -for now, will continue holding coreg and cozaar while actively diuresing him  -continue heart healthy diet   5-hx of CAD: status post sten X  2 -no CP and troponin neg -will continue statins and resume ARB and B-blocker once BP/ acute CHF improved and is compensated again. Essentially resume at discharge.  6-diabetes type 2 with neuropathy: on chronic insulin therapy -continue home NPH and continue SSI  -will continue holding oral agents and trulicity   7-HLD -will continue statins     8-hx of pulmonary HTN -plan is to resume home revatio once BP stable to tolerate it  -BP remains soft, but stable.  9-hypokalemia/non sustained runs of V-tach -electrolytes abnormalities associated with diuresis -Mg WNL -will continue monitoring and repleting electrolytes as needed  -K 2.8 today   Code Status: Full Family Communication: wife at bedside  Disposition Plan: remains inpatient, continue IV lasix and follow clinical response. Hopefully home in 24-48 hours.   Consultants:  None   Procedures:  2-D echo - Left ventricle: The cavity size was mildly dilated. There was   moderate concentric hypertrophy. Systolic function was severely   reduced. The estimated ejection fraction was in the range of 15%   to 20%. Diffuse hypokinesis. - Ventricular septum: Septal motion showed paradox. - Aortic valve: Trileaflet; mildly thickened, mildly calcified   leaflets. Transvalvular velocity was within the normal range.   There was no stenosis. There was trivial regurgitation. - Mitral valve: There was moderate regurgitation. - Left atrium: The atrium was moderately dilated. - Right ventricle: The cavity size was mildly dilated. Wall   thickness was normal. Systolic function was moderately reduced. - Right atrium: The atrium was normal in size. - Tricuspid valve: There was moderate regurgitation. - Pulmonary arteries: Systolic pressure was moderately increased.   PA peak pressure: 44 mm Hg (S). -  Line: A venous catheter was visualized in the superior vena cava,   with its tip in the right atrium. No abnormal features noted. -  Pericardium, extracardiac: There was no pericardial effusion.  Impressions: - Since the last study on 02/11/15 LVEF has decreased from 20-25%   to 15-20%.  Antibiotics:  None   HPI/Subjective: No CP, no palpitation and with significant improvement in his orthopnea symptoms. Still with some peripheral fluid overload, but much improved.  Objective: Vitals:   11/23/16 0451 11/23/16 1122  BP: 100/62 110/63  Pulse: (!) 52 (!) 52  Resp: 18   Temp: (!) 97.4 F (36.3 C)   SpO2: 100% 99%    Intake/Output Summary (Last 24 hours) at 11/23/16 1701 Last data filed at 11/23/16 1640  Gross per 24 hour  Intake              960 ml  Output             4750 ml  Net            -3790 ml   Filed Weights   11/21/16 0500 11/22/16 0501 11/23/16 0451  Weight: 112.9 kg (248 lb 14.4 oz) 111 kg (244 lb 11.2 oz) 107.2 kg (236 lb 6.4 oz)    Exam:   General:  No fever, no CP and no palpitations. Patient reported no nausea, no vomiting, no abd pain and improvement in LE swelling. No orthopnea.  Cardiovascular: S1 and S2, no rubs, no gallops, no JVD  Respiratory: good air movement, no wheezing, no frank crackles, no rhonchi. normal resp effort.   Abdomen: soft, NT, ND, positive BS  Musculoskeletal: 1-2+ edema (continue improving), no cyanosis, no clubbing  Data Reviewed: Basic Metabolic Panel:  Recent Labs Lab 11/19/16 0216 11/20/16 0404 11/21/16 0452 11/22/16 0612 11/23/16 0425  NA 137 140 136 134* 138  K 4.4 4.3 3.4* 3.5 2.8*  CL 103 105 101 97* 96*  CO2 25 24 25 28 31   GLUCOSE 163* 108* 84 137* 93  BUN 29* 31* 33* 36* 36*  CREATININE 1.31* 1.44* 1.37* 1.48* 1.38*  CALCIUM 9.2 9.3 9.0 9.3 9.7  MG  --   --   --  1.9  --    CBC:  Recent Labs Lab 11/18/16 0914  WBC 4.1  HGB 10.4*  HCT 32.8*  MCV 87.7  PLT 149*   BNP (last 3 results)  Recent Labs  11/18/16 1355  BNP 584.3*    ProBNP (last 3 results)  Recent Labs  11/12/16 1559  PROBNP 388.0*     CBG:  Recent Labs Lab 11/22/16 1641 11/22/16 2054 11/23/16 0749 11/23/16 1121 11/23/16 1637  GLUCAP 118* 162* 112* 160* 156*    Studies: No results found.  Scheduled Meds: . apixaban  5 mg Oral BID  . atorvastatin  80 mg Oral Daily  . furosemide  60 mg Intravenous TID  . gabapentin  600 mg Oral TID  . insulin aspart  0-5 Units Subcutaneous QHS  . insulin aspart  0-9 Units Subcutaneous TID WC  . insulin aspart protamine- aspart  30 Units Subcutaneous BID WC  . magnesium oxide  400 mg Oral Daily  . metolazone  2.5 mg Oral Daily  . potassium chloride  40 mEq Oral BID  . sodium chloride flush  3 mL Intravenous Q12H  . sotalol  120 mg Oral BID   Continuous Infusions: . sodium chloride      Time spent: 30 minutes   Barton Dubois  [facility] Pager [phone]. If 7PM-7AM, please contact night-coverage at [email or web address], password [facility] 11/23/2016, 5:01 PM  LOS: 4 days

## 2016-11-23 NOTE — Care Management Important Message (Signed)
Important Message  Patient Details  Name: [name] [name] MRN: [id number] Date of Birth: 08-Dec-1950   Medicare Important Message Given:  Yes    [name] 11/23/2016, 3:39 PM

## 2016-11-24 DIAGNOSIS — D696 Thrombocytopenia, unspecified: Secondary | ICD-10-CM

## 2016-11-24 DIAGNOSIS — E118 Type 2 diabetes mellitus with unspecified complications: Secondary | ICD-10-CM

## 2016-11-24 DIAGNOSIS — Z95810 Presence of automatic (implantable) cardiac defibrillator: Secondary | ICD-10-CM

## 2016-11-24 DIAGNOSIS — I10 Essential (primary) hypertension: Secondary | ICD-10-CM

## 2016-11-24 DIAGNOSIS — Z8679 Personal history of other diseases of the circulatory system: Secondary | ICD-10-CM

## 2016-11-24 LAB — GLUCOSE, CAPILLARY
GLUCOSE-CAPILLARY: 123 mg/dL — AB (ref 65–99)
GLUCOSE-CAPILLARY: 150 mg/dL — AB (ref 65–99)
Glucose-Capillary: 112 mg/dL — ABNORMAL HIGH (ref 65–99)
Glucose-Capillary: 151 mg/dL — ABNORMAL HIGH (ref 65–99)

## 2016-11-24 LAB — BASIC METABOLIC PANEL
ANION GAP: 13 (ref 5–15)
BUN: 39 mg/dL — ABNORMAL HIGH (ref 6–20)
CALCIUM: 9.4 mg/dL (ref 8.9–10.3)
CO2: 29 mmol/L (ref 22–32)
Chloride: 91 mmol/L — ABNORMAL LOW (ref 101–111)
Creatinine, Ser: 1.33 mg/dL — ABNORMAL HIGH (ref 0.61–1.24)
GFR, EST NON AFRICAN AMERICAN: 54 mL/min — AB (ref >60)
Glucose, Bld: 137 mg/dL — ABNORMAL HIGH (ref 65–99)
POTASSIUM: 3.2 mmol/L — AB (ref 3.5–5.1)
SODIUM: 133 mmol/L — AB (ref 135–145)

## 2016-11-24 LAB — CBC
HCT: 36.7 % — ABNORMAL LOW (ref 39.0–52.0)
Hemoglobin: 12.1 g/dL — ABNORMAL LOW (ref 13.0–17.0)
MCH: 28.1 pg (ref 26.0–34.0)
MCHC: 33 g/dL (ref 30.0–36.0)
MCV: 85.3 fL (ref 78.0–100.0)
PLATELETS: 227 10*3/uL (ref 150–400)
RBC: 4.3 MIL/uL (ref 4.22–5.81)
RDW: 14.9 % (ref 11.5–15.5)
WBC: 4.3 10*3/uL (ref 4.0–10.5)

## 2016-11-24 LAB — MAGNESIUM: MAGNESIUM: 1.7 mg/dL (ref 1.7–2.4)

## 2016-11-24 MED ORDER — FUROSEMIDE 10 MG/ML IJ SOLN
60.0000 mg | Freq: Two times a day (BID) | INTRAMUSCULAR | Status: DC
  Administered 2016-11-24 – 2016-11-25 (×2): 60 mg via INTRAVENOUS
  Filled 2016-11-24 (×2): qty 6

## 2016-11-24 MED ORDER — POTASSIUM CHLORIDE CRYS ER 20 MEQ PO TBCR
40.0000 meq | EXTENDED_RELEASE_TABLET | Freq: Every day | ORAL | Status: DC
  Administered 2016-11-25: 40 meq via ORAL
  Filled 2016-11-24: qty 2

## 2016-11-24 MED ORDER — INSULIN ASPART PROT & ASPART (70-30 MIX) 100 UNIT/ML ~~LOC~~ SUSP
10.0000 [IU] | Freq: Two times a day (BID) | SUBCUTANEOUS | Status: DC
  Administered 2016-11-24 – 2016-11-27 (×6): 10 [IU] via SUBCUTANEOUS
  Filled 2016-11-24: qty 10

## 2016-11-24 NOTE — Progress Notes (Signed)
Pt alert and oriented up walking the halls with no distress. Off balance one time today with up to fast. Others wise uneventful day. B/p-hr- stable low

## 2016-11-24 NOTE — Progress Notes (Signed)
PROGRESS NOTE    [name] [name]  OVF:[id number] DOB: 1950/10/18 DOA: 11/18/2016 PCP: Golden Circle, FNP    Brief Narrative:  66 year old male who presented with dyspnea, lower extremity edema and weight gain. Patient does have this significant past medical history for atrial flutter, systolic heart failure status post AICD and biventricular cardiac resynchronization therapy, coronary artery disease, type 2 diabetes mellitus, and hypertension. Reported worsening dyspnea on exertion, orthopnea, lower extremity edema, which have been refractive to outpatient therapy with increased dose of furosemide. Reported 25 pound increase. On his initial physical examination, blood pressure 96/55, heart rate 56, respiratory rate 16, temperature 97, oxygenation 97%. Moist mucous membranes, lungs had bilateral rails, at bases, no wheezing, heart S1-S2 present, no gallops or murmurs, abdomen soft nontender and non distended, positive lower extremity edema 2+.   Patient admitted with systolic heart failure decompensation, acute on chronic.   Assessment & Plan:   Principal Problem:   Acute on chronic left and right systolic w/ diastolic heart failure, NYHA class 2 (HCC) Active Problems:   Medtronic biventricular ICD, serial number  [id number] H    Hx of atrial flutter   CAD (coronary artery disease)-RCA-T, 70% PL (off CFX), 99% Prox LAD/90% Dist LAD, S/P TAXUS stent x 2   HTN (hypertension)   Diabetes mellitus with complication (HCC)   Thrombocytopenia (HCC)   Acute on chronic combined systolic and diastolic CHF (congestive heart failure) ([place])   1. Decompensated systolic heart failure. Patient responding well to diuresis, 4,625 urine output over last 24 hours, will continue to target negative fluid balance, will reduce dose of furosemide due to hypotension. Continue with sotalol.  2. Atrial flutter. Rate controlled with sotalol, will continue anticoagulation with apixaban.   3. Hypertension.  Episodes of hypotension, holding antihypertensives to prevent worsening hypotension.   4. Coronary artery disease. No chest pain, holding on antiplatelet therapy, patient on apixaban.   5. Type 2 diabetes mellitus. Will continue glucose cover and monitoring with insulin sliding scale, patient tolerating po well. Will reduce dose of long acting insulin to prevent hypoglycemia. Capillary glucose 149, 112, 151, 123.   6. Pulmonary hypertension. Continue diuresis with caution. Oxymetry monitoring.  7. Thrombocytopenia, Platelets stable, continue to follow cell count as needed.    DVT prophylaxis: scd  Code Status: full Family Communication:  Disposition Plan:    Consultants:     Procedures:     Antimicrobials:      Subjective: Dyspnea has been improving, along with lower extremity edema, not back to his baseline yet, no nausea or vomiting, no chest pain, nausea or vomiting.   Objective: Vitals:   11/23/16 1936 11/24/16 0530 11/24/16 0730 11/24/16 1000  BP: 102/83 100/65 99/65   Pulse: 62 (!) 55 (!) 52 (!) 58  Resp: 18 20 17    Temp: 98.1 F (36.7 C) 98.3 F (36.8 C) 97.9 F (36.6 C)   TempSrc: Oral Oral Oral   SpO2: 97% 97% 95%   Weight:  105.6 kg (232 lb 12.8 oz)    Height:        Intake/Output Summary (Last 24 hours) at 11/24/16 1048 Last data filed at 11/24/16 1000  Gross per 24 hour  Intake             1200 ml  Output             4200 ml  Net            -3000 ml   [employer]  11/22/16 0501 11/23/16 0451 11/24/16 0530  Weight: 111 kg (244 lb 11.2 oz) 107.2 kg (236 lb 6.4 oz) 105.6 kg (232 lb 12.8 oz)    Examination:  General: deconditioned Neurology: Awake and alert, non focal  E ENT: mild pallor, no icterus, oral mucosa moist Cardiovascular: No JVD. S1-S2 present, rhythmic, no gallops, rubs, or murmurs. ++ pitting lower extremity edema. Pulmonary: vesicular breath sounds bilaterally, with decreased breath sounds at bases, adequate air movement, no  wheezing, rhonchi or rales. Gastrointestinal. Abdomen flat, no organomegaly, non tender, no rebound or guarding Skin. No rashes Musculoskeletal: no joint deformities     Data Reviewed: I have personally reviewed following labs and imaging studies  CBC:  Recent Labs Lab 11/18/16 0914 11/24/16 0535  WBC 4.1 4.3  HGB 10.4* 12.1*  HCT 32.8* 36.7*  MCV 87.7 85.3  PLT 149* 456   Basic Metabolic Panel:  Recent Labs Lab 11/20/16 0404 11/21/16 0452 11/22/16 0612 11/23/16 0425 11/24/16 0535  NA 140 136 134* 138 133*  K 4.3 3.4* 3.5 2.8* 3.2*  CL 105 101 97* 96* 91*  CO2 24 25 28 31 29   GLUCOSE 108* 84 137* 93 137*  BUN 31* 33* 36* 36* 39*  CREATININE 1.44* 1.37* 1.48* 1.38* 1.33*  CALCIUM 9.3 9.0 9.3 9.7 9.4  MG  --   --  1.9  --  1.7   GFR: Estimated Creatinine Clearance: 67.5 mL/min (A) (by C-G formula based on SCr of 1.33 mg/dL (H)). Liver Function Tests: No results for input(s): AST, ALT, ALKPHOS, BILITOT, PROT, ALBUMIN in the last 168 hours. No results for input(s): LIPASE, AMYLASE in the last 168 hours. No results for input(s): AMMONIA in the last 168 hours. Coagulation Profile: No results for input(s): INR, PROTIME in the last 168 hours. Cardiac Enzymes: No results for input(s): CKTOTAL, CKMB, CKMBINDEX, TROPONINI in the last 168 hours. BNP (last 3 results)  Recent Labs  11/12/16 1559  PROBNP 388.0*   HbA1C: No results for input(s): HGBA1C in the last 72 hours. CBG:  Recent Labs Lab 11/23/16 0749 11/23/16 1121 11/23/16 1637 11/23/16 2121 11/24/16 0801  GLUCAP 112* 160* 156* 149* 112*   Lipid Profile: No results for input(s): CHOL, HDL, LDLCALC, TRIG, CHOLHDL, LDLDIRECT in the last 72 hours. Thyroid Function Tests: No results for input(s): TSH, T4TOTAL, FREET4, T3FREE, THYROIDAB in the last 72 hours. Anemia Panel: No results for input(s): VITAMINB12, FOLATE, FERRITIN, TIBC, IRON, RETICCTPCT in the last 72 hours.    Radiology Studies: I have  reviewed all of the imaging during this hospital visit personally     Scheduled Meds: . apixaban  5 mg Oral BID  . atorvastatin  80 mg Oral Daily  . furosemide  60 mg Intravenous TID  . gabapentin  600 mg Oral TID  . insulin aspart  0-5 Units Subcutaneous QHS  . insulin aspart  0-9 Units Subcutaneous TID WC  . insulin aspart protamine- aspart  30 Units Subcutaneous BID WC  . magnesium oxide  400 mg Oral Daily  . potassium chloride  40 mEq Oral BID  . sodium chloride flush  3 mL Intravenous Q12H  . sotalol  120 mg Oral BID   Continuous Infusions: . sodium chloride       LOS: 5 days        Mauricio Gerome Apley, MD [facility] Pager [phone]

## 2016-11-25 ENCOUNTER — Telehealth: Payer: Self-pay | Admitting: Cardiology

## 2016-11-25 DIAGNOSIS — N182 Chronic kidney disease, stage 2 (mild): Secondary | ICD-10-CM

## 2016-11-25 DIAGNOSIS — E1122 Type 2 diabetes mellitus with diabetic chronic kidney disease: Secondary | ICD-10-CM

## 2016-11-25 DIAGNOSIS — I5043 Acute on chronic combined systolic (congestive) and diastolic (congestive) heart failure: Secondary | ICD-10-CM

## 2016-11-25 LAB — GLUCOSE, CAPILLARY
GLUCOSE-CAPILLARY: 209 mg/dL — AB (ref 65–99)
GLUCOSE-CAPILLARY: 172 mg/dL — AB (ref 65–99)
Glucose-Capillary: 130 mg/dL — ABNORMAL HIGH (ref 65–99)
Glucose-Capillary: 205 mg/dL — ABNORMAL HIGH (ref 65–99)

## 2016-11-25 LAB — BASIC METABOLIC PANEL
ANION GAP: 12 (ref 5–15)
BUN: 38 mg/dL — AB (ref 6–20)
CHLORIDE: 91 mmol/L — AB (ref 101–111)
CO2: 32 mmol/L (ref 22–32)
Calcium: 9.4 mg/dL (ref 8.9–10.3)
Creatinine, Ser: 1.26 mg/dL — ABNORMAL HIGH (ref 0.61–1.24)
GFR calc Af Amer: >60 mL/min (ref >60)
GFR calc non Af Amer: 58 mL/min — ABNORMAL LOW (ref >60)
GLUCOSE: 113 mg/dL — AB (ref 65–99)
POTASSIUM: 3 mmol/L — AB (ref 3.5–5.1)
Sodium: 135 mmol/L (ref 135–145)

## 2016-11-25 MED ORDER — POTASSIUM CHLORIDE CRYS ER 20 MEQ PO TBCR: 20.0000 meq | EXTENDED_RELEASE_TABLET | Freq: Every day | ORAL | 0 refills | Status: DC

## 2016-11-25 MED ORDER — FUROSEMIDE 10 MG/ML IJ SOLN
80.0000 mg | Freq: Two times a day (BID) | INTRAMUSCULAR | Status: DC
  Administered 2016-11-25 – 2016-11-27 (×4): 80 mg via INTRAVENOUS
  Filled 2016-11-25 (×4): qty 8

## 2016-11-25 MED ORDER — LOSARTAN POTASSIUM 25 MG PO TABS: 25.0000 mg | ORAL_TABLET | Freq: Every day | ORAL | 3 refills | Status: DC

## 2016-11-25 MED ORDER — METOLAZONE 5 MG PO TABS
5.0000 mg | ORAL_TABLET | Freq: Once | ORAL | Status: AC
  Administered 2016-11-25: 5 mg via ORAL
  Filled 2016-11-25: qty 1

## 2016-11-25 MED ORDER — MAGNESIUM SULFATE 4 GM/100ML IV SOLN
4.0000 g | Freq: Once | INTRAVENOUS | Status: AC
  Administered 2016-11-25: 4 g via INTRAVENOUS
  Filled 2016-11-25: qty 100

## 2016-11-25 MED ORDER — TORSEMIDE 100 MG PO TABS: 50.0000 mg | ORAL_TABLET | Freq: Every day | ORAL | 0 refills | Status: DC

## 2016-11-25 MED ORDER — POTASSIUM CHLORIDE CRYS ER 20 MEQ PO TBCR
40.0000 meq | EXTENDED_RELEASE_TABLET | Freq: Two times a day (BID) | ORAL | Status: DC
  Administered 2016-11-25 – 2016-11-26 (×3): 40 meq via ORAL
  Filled 2016-11-25 (×3): qty 2

## 2016-11-25 MED ORDER — POTASSIUM CHLORIDE CRYS ER 20 MEQ PO TBCR
40.0000 meq | EXTENDED_RELEASE_TABLET | Freq: Once | ORAL | Status: AC
  Administered 2016-11-25: 40 meq via ORAL
  Filled 2016-11-25: qty 2

## 2016-11-25 NOTE — Telephone Encounter (Signed)
Spoke with pt, Follow up scheduled  

## 2016-11-25 NOTE — Telephone Encounter (Signed)
Make sure pt has fu appt with me Kirk Ruths

## 2016-11-25 NOTE — Consult Note (Signed)
Advanced Heart Failure Team Consult Note  Primary Cardiologist:  Dr. [name]  Reason for Consultation: Acute on chronic systolic CHF  HPI:    [name] [name] is seen today for evaluation of Acute on chronic systolic CHF at the request of  [name].   [name] [name] is a 66 y.o. male with history of chronic systolic CHF due to ICM, [name], [name], [name], [name], [name], HTN, HL, [name]   ICD interrogation 11/12/16 showed decreased thoracic impedence, AFIB, and fluid elevation.   Seen in PCP office with worsening BLE edema. Lasix increased to 80 mg TID for 4 days.   Pt presented to [facility] 11/18/16 with worsening SOB and 24 lb weight gain despite his lasix being increased by PCP. Pertinent labs on admission include K 3.7, Creatinine 1.38, BUN 31, Hgb 10.4, WBC 4.1, and BNP 584. CXR with mild bronchitic changes with bibasilar atelectasis.   Echo 11/19/16 with LVEF 15-20%, Mod MR, Mod LAE, Mild RV dilation and moderately reduced function, Moderate TR, PA peak pressure 44 mm Hg.   Pt diuresed reasonable well with 14.L and 20 lbs off as of 11/25/16 am. With long h/o HF. HF navigator saw patient. ReDs vest 46% this morning consistent with on-going volume overload. Attending was encouraged to keep pt at least one additional night for more diuresis, and HF team consultation.   Pt feeling much better from admit. He states he was at his USOH until a few weeks ago (see note from his PCP visit with worsening edema and increase of lasix as above).  He did not experience relief from increased lasix, but weight continued to climb. 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 SOB with mild exertion and + orthopnea for the past few weeks. He denies chest pain or palpitations. No dizziness, lightheadedness, or syncope/near-syncope. He can't wear compression hose as they are painful and have caused sores (even this  admission).  Review of Systems: [y] = yes, [ ]  = no   General: Weight gain [y]; Weight loss [ ] ; Anorexia [ ] ; Fatigue [ ] ; Fever [ ] ; Chills [ ] ; Weakness [ ]   Cardiac: Chest pain/pressure [ ] ; Resting SOB [ ] ; Exertional SOB [y]; Orthopnea [y]; Pedal Edema [y]; Palpitations [ ] ; Syncope [ ] ; Presyncope [ ] ; Paroxysmal nocturnal dyspnea[ ]   Pulmonary: Cough [ ] ; Wheezing[ ] ; Hemoptysis[ ] ; Sputum [ ] ; Snoring [ ]   GI: Vomiting[ ] ; Dysphagia[ ] ; Melena[ ] ; Hematochezia [ ] ; Heartburn[ ] ; Abdominal pain [ ] ; Constipation [ ] ; Diarrhea [ ] ; BRBPR [ ]   GU: Hematuria[ ] ; Dysuria [ ] ; Nocturia[ ]   Vascular: Pain in legs with walking [ ] ; Pain in feet with lying flat [ ] ; Non-healing sores [ ] ; Stroke [ ] ; TIA [ ] ; Slurred speech [ ] ;  Neuro: Headaches[ ] ; Vertigo[ ] ; Seizures[ ] ; Paresthesias[ ] ;Blurred vision [ ] ; Diplopia [ ] ; Vision changes [ ]   Ortho/Skin: Arthritis [y]; Joint pain [y]; Muscle pain [ ] ; Joint swelling [ ] ; Back Pain [ ] ; Rash [ ]   Psych: Depression[ ] ; Anxiety[ ]   Heme: Bleeding problems [ ] ; Clotting disorders [ ] ; Anemia [ ]   Endocrine: Diabetes [y]; Thyroid dysfunction[ ]   Home Medications Prior to Admission medications   Medication Sig Start Date End Date Taking? Authorizing Provider  apixaban (ELIQUIS) 5 MG TABS tablet Take 1 tablet (5 mg total) by mouth 2 (  two) times daily. 04/09/15  Yes Lelon Perla, MD  atorvastatin (LIPITOR) 80 MG tablet TAKE ONE TABLET BY MOUTH ONCE DAILY 07/31/16  Yes Lelon Perla, MD  carvedilol (COREG) 3.125 MG tablet Take 3.125 mg by mouth 2 (two) times daily with a meal.  05/11/16  Yes [provider]  Dulaglutide (TRULICITY) 5.68 LE/7.5TZ SOPN Inject 0.75 mg into the skin once a week. 06/25/16  Yes Golden Circle, FNP  furosemide (LASIX) 80 MG tablet Take 3 tablets by mouth daily for 4 days then 2 tablets by mouth daily. Patient taking differently: Take 160 mg by mouth daily.  11/12/16  Yes Golden Circle, FNP  gabapentin  (NEURONTIN) 300 MG capsule Take 1 tablet in the morning and afternoon and 2 tablets at bedtime Patient taking differently: Take 600-900 mg by mouth See admin instructions. 600mg  in the morning and 900mg  at bedtime 06/23/16  Yes Patel, Donika K, DO  insulin NPH-regular Human (NOVOLIN 70/30 RELION) (70-30) 100 UNIT/ML injection Inject 30 Units into the skin 2 (two) times daily with a meal. 12/26/15  Yes Golden Circle, FNP  magnesium oxide (MAG-OX) 400 MG tablet Take 1 tablet (400 mg total) by mouth daily. 06/01/12  Yes Weaver, Scott T, PA-C  metFORMIN (GLUCOPHAGE-XR) 750 MG 24 hr tablet TAKE TWO TABLETS BY MOUTH ONCE DAILY WITH  BREAKFAST Patient taking differently: TAKE 750MG  BY MOUTH TWICE DAILY 06/19/16  Yes Golden Circle, FNP  Multiple Vitamins-Minerals (MULTIVITAMIN PO) Take 1 capsule by mouth daily.    Yes [provider]  sildenafil (REVATIO) 20 MG tablet Take 1-5 tablets (20-100 mg total) by mouth daily as needed. 01/08/16  Yes Golden Circle, FNP  sotalol (BETAPACE) 120 MG tablet TAKE ONE TABLET BY MOUTH TWICE DAILY Patient taking differently: TAKE 120MG  BY MOUTH TWICE DAILY 07/31/16  Yes Lelon Perla, MD  gabapentin (NEURONTIN) 600 MG tablet Take 1 tablet (600 mg total) by mouth 3 (three) times daily. Patient not taking: Reported on 11/18/2016 06/25/16   Narda Amber K, DO  losartan (COZAAR) 25 MG tablet Take 1 tablet (25 mg total) by mouth daily. 11/25/16   Arrien, Jimmy Picket, MD  potassium chloride SA (K-DUR,KLOR-CON) 20 MEQ tablet Take 1 tablet (20 mEq total) by mouth daily. 11/26/16 12/26/16  Arrien, Jimmy Picket, MD  torsemide (DEMADEX) 100 MG tablet Take 0.5 tablets (50 mg total) by mouth daily. 11/25/16 12/25/16  Arrien, Jimmy Picket, MD    Past Medical History: Past Medical History:  Diagnosis Date  . AICD (automatic cardioverter/defibrillator) present 02/05/2014   Upgrade to Medtronic biventricular ICD, serial number  [id number]   . Atrial flutter  ([place]) 04/2012   s/p TEE-EPS+RFCA 04/2012  . CAD (coronary artery disease) 0017,4944 X 2    RCA-T, 70% PL (off CFX), 99% Prox LAD/90% Dist LAD, S/P TAXUS stent x 2  . CHF (congestive heart failure) ([place])   . Chronic anticoagulation   . Chronic systolic heart failure ([place])   . CKD (chronic kidney disease)   . Diabetic retinopathy ([place])   . DM type 2 (diabetes mellitus, type 2) ([facility])    insulin dependent  . HTN (hypertension)   . Hypercholesteremia    ablation  . ICD (implantable cardiac defibrillator) in place   . Ischemic cardiomyopathy March 2015   20-25% 2D   . Nephrolithiasis   . Ventricular tachycardia [facility])     Past Surgical History: Past Surgical History:  Procedure Laterality Date  . ATRIAL FLUTTER ABLATION N/A  05/19/2012   Procedure: ATRIAL FLUTTER ABLATION;  Surgeon: Thompson Grayer, MD;  Location: [facility] CATH LAB;  Service: Cardiovascular;  Laterality: N/A;  . BI-VENTRICULAR IMPLANTABLE CARDIOVERTER DEFIBRILLATOR UPGRADE N/A 02/05/2014   Procedure: BI-VENTRICULAR IMPLANTABLE CARDIOVERTER DEFIBRILLATOR UPGRADE;  Surgeon: Evans Lance, MD;  Location: [facility] CATH LAB;  Service: Cardiovascular;  Laterality: N/A;  . BIV ICD GENERTAOR CHANGE OUT  02/05/2014   Upgrade to Medtronic biventricular ICD, serial number  [id number] by Dr. [name]  . CARDIAC DEFIBRILLATOR PLACEMENT  2007    Medtronic Maximo VR, serial number [id number]  . PERCUTANEOUS CORONARY STENT INTERVENTION (PCI-S)  January 2002   PTCA/Stent Distal RCA  . PERCUTANEOUS CORONARY STENT INTERVENTION (PCI-S)  June 2002   PTCA/Stent x 3 RCA, thrombolysis - failed  . PERCUTANEOUS CORONARY STENT INTERVENTION (PCI-S)  July 2006   TAXUS stents to prox and distal LAD    Family History: Family History  Problem Relation Age of Onset  . Heart failure Father        Deceased  . Alzheimer's disease Mother        Living  . Heart attack Mother   . CAD Brother   . CAD Brother   . CAD Brother   . CAD Brother   . Healthy Son   .  Healthy Daughter   . Diabetes Brother     Social History: Social History   Social History  . Marital status: Married    Spouse name: N/A  . Number of children: 4  . Years of education: 14   Occupational History  . brick layer - currently unemployed    Social History Main Topics  . Smoking status: Former Smoker    Packs/day: 1.00    Years: 29.00    Types: Cigarettes    Quit date: 03/02/2000  . Smokeless tobacco: Never Used  . Alcohol use No  . Drug use: No  . Sexual activity: Not Asked   Other Topics Concern  . None   Social History Narrative   Pt lives with wife in a one story home - married for 97 years. He hunts regularly and is active, walking > 1 mile at times without difficulty.  Has 4 children.     Retired Horticulturist, commercial.  Education: some college.    Allergies:  Allergies  Allergen Reactions  . Penicillins Other (See Comments)    Unknown reaction    Objective:    Vital Signs:   Temp:  [97.3 F (36.3 C)-97.9 F (36.6 C)] 97.9 F (36.6 C) (09/26 1100) Pulse Rate:  [55-67] 56 (09/26 1100) Resp:  [17-20] 17 (09/26 1100) BP: (101-108)/(64-83) 104/64 (09/26 1100) SpO2:  [92 %-98 %] 96 % (09/26 1100) Weight:  [228 lb 14.4 oz (103.8 kg)] 228 lb 14.4 oz (103.8 kg) (09/26 0508) Last BM Date: 11/25/16  Weight change: Filed Weights   11/23/16 0451 11/24/16 0530 11/25/16 0508  Weight: 236 lb 6.4 oz (107.2 kg) 232 lb 12.8 oz (105.6 kg) 228 lb 14.4 oz (103.8 kg)    Intake/Output:   Intake/Output Summary (Last 24 hours) at 11/25/16 1458 Last data filed at 11/25/16 1345  Gross per 24 hour  Intake             1230 ml  Output             5220 ml  Net            -3990 ml      Physical Exam    General:  Well appearing. No resp difficulty HEENT: Normal Neck: supple. JVP difficult due to body habitus, but appears at least 9-10 cm . Carotids 2+ bilat; no bruits. No lymphadenopathy or thyromegaly appreciated. Cor: PMI nondisplaced. Regular rate & rhythm. No rubs,  gallops or murmurs. Lungs: Slightly diminished basilar sounds.  Abdomen: Obese, soft, nontender, nondistended. No hepatosplenomegaly. No bruits or masses. Good bowel sounds. Extremities: no cyanosis, clubbing, or rash. 2+ edema half to knees.  Neuro: alert & orientedx3, cranial nerves grossly intact. moves all 4 extremities w/o difficulty. Affect pleasant   Telemetry   NSR with V pacing. Personally reviewed.   EKG    NSR on admit. Personally reviewed.   Labs   Basic Metabolic Panel:  Recent Labs Lab 11/21/16 0452 11/22/16 0612 11/23/16 0425 11/24/16 0535 11/25/16 0610  NA 136 134* 138 133* 135  K 3.4* 3.5 2.8* 3.2* 3.0*  CL 101 97* 96* 91* 91*  CO2 25 28 31 29  32  GLUCOSE 84 137* 93 137* 113*  BUN 33* 36* 36* 39* 38*  CREATININE 1.37* 1.48* 1.38* 1.33* 1.26*  CALCIUM 9.0 9.3 9.7 9.4 9.4  MG  --  1.9  --  1.7  --     Liver Function Tests: No results for input(s): AST, ALT, ALKPHOS, BILITOT, PROT, ALBUMIN in the last 168 hours. No results for input(s): LIPASE, AMYLASE in the last 168 hours. No results for input(s): AMMONIA in the last 168 hours.  CBC:  Recent Labs Lab 11/24/16 0535  WBC 4.3  HGB 12.1*  HCT 36.7*  MCV 85.3  PLT 227    Cardiac Enzymes: No results for input(s): CKTOTAL, CKMB, CKMBINDEX, TROPONINI in the last 168 hours.  BNP: BNP (last 3 results)  Recent Labs  11/18/16 1355  BNP 584.3*    ProBNP (last 3 results)  Recent Labs  11/12/16 1559  PROBNP 388.0*     CBG:  Recent Labs Lab 11/24/16 1222 11/24/16 1658 11/24/16 2157 11/25/16 0733 11/25/16 1119  GLUCAP 151* 123* 150* 130* 209*    Coagulation Studies: No results for input(s): LABPROT, INR in the last 72 hours.   Imaging    No results found.   Medications:     Current Medications: . apixaban  5 mg Oral BID  . atorvastatin  80 mg Oral Daily  . furosemide  60 mg Intravenous BID  . gabapentin  600 mg Oral TID  . insulin aspart  0-5 Units Subcutaneous  QHS  . insulin aspart  0-9 Units Subcutaneous TID WC  . insulin aspart protamine- aspart  10 Units Subcutaneous BID WC  . magnesium oxide  400 mg Oral Daily  . potassium chloride  40 mEq Oral Daily  . sotalol  120 mg Oral BID     Infusions:     Patient Profile   KENNETH CUARESMA is a 66 y.o. male with history of chronic systolic CHF due to ICM, [name], [name], [name], [name], HTN, HL, [name]   Pt admitted with worsening SOB and > 20 lb weight gain.   Assessment/Plan   1. Acute on chronic systolic CHF  - EF 65-78% by echo this admit. He has Medtronic BiV ICD in place.  - ReDS vest this am 45%, consistent with ongoing volume overload.  - He had good diuresis overnight. Will continue IV lasix but increase to 80 mg IV BID.  - Supp K with 40 meq BID and an additional 40 meq  now.  - Pressures are soft, so will focus on diuresis for now. Plan to add low dose spiro in am if pressures stable.  - Holding BB for now with decompensation.  - No ACE/ARB for now with soft pressures. Pt was on losartan 25 mg PTA. Consider low dose in am if pressures stable after diuresis.  - He has significant disease, and may be nearing end-stage HF. He will need CPX testing once fluid optimized. 2. [name] - No s/s of ischemia.   Continue ASA and statin - LHC 7/06: EF 35-40%, mLAD 99%, dLAD 90%, mid to dist CFX 70%, RCA occluded. PCI: DES to mid and dist LAD. Carotids 4/12: 0-39% bilat ICA - Had unremarkable Stress test in 2013. Prior apical and inferior infarct. No ischemia.  3. HTN - Stable on current regimen. 4. [name] - Per primary 5. CKD III - Creatinine improved with diuresis. Continue to follow.  6. [name] - Goal K > 4.0 and Mg 2.0 - Will supp both. Mg 1.7 yesterday. Recheck in am.  - Continue sotalol 7. PAF s/p RFCA 04/2012 - Noted to be back in Afib for ~ 1 month prior to admission. Appears to be in NSR today.  - Continue eliquis for  [facility] - CHA2DS2/VASc is at least 7 with history of ? TIA.  8. Hypokalemia - Supp as above.   Continue to diurese pt as above. Will plan on seeing in HF clinic as outpatient.   Length of Stay: [place], [place]  11/25/2016, 2:58 PM  Advanced Heart Failure Team Pager [phone] (M-F; 7a - 4p)  Please contact [facility] for night-coverage after hours (4p -7a ) and weekends on [email or web address]   Patient seen and examined with the above-signed Advanced Practice Provider and/or Housestaff. I personally reviewed laboratory data, imaging studies and relevant notes. I independently examined the patient and formulated the important aspects of the plan. I have edited the note to reflect any of my changes or salient points. I have personally discussed the plan with the patient and/or family.  Echo reviewed personally. He has severe biventricular dysfunction. Now with progressive NYHA III-IIIB symptoms. Device interrogation (viewed personally) shows recent bout of AF but now back in NSR. Suspect AF contributing to decompensation. On exam (and ReDS) currently with marked volume overload. Will need several more days of IV diuresis. Long talk about possible need for advanced therapies in the near future. Given RV dysfunction likely favor transplant w/u over VAD. I will d/w Dr. [name].  Glori Bickers, MD  6:36 PM

## 2016-11-25 NOTE — Telephone Encounter (Signed)
Returned call to patient's daughter [name] left message on personal voice mail I will send message to Dr.Crenshaw to let him know father is in [facility] hospital.

## 2016-11-25 NOTE — Telephone Encounter (Signed)
New message   Darnelle Bos is calling from Heart failure clinic on the behalf of Dr.Crenshaw she verbalized that he informed   her that the pt needs to be seen in less than 10 days with him  She wants a call back so she can put information on pt discharge instructions

## 2016-11-25 NOTE — Telephone Encounter (Signed)
New message    Pt daughter is calling because she wants to know if Dr. [name] knows pt is in the hospital.

## 2016-11-25 NOTE — Progress Notes (Signed)
I performed ReDs vest reading.  It measured 46%.  Measurements above 35% consistent with above normal lung fluid volume.

## 2016-11-25 NOTE — Discharge Summary (Addendum)
Physician Discharge Summary  [name] [name] INO:[id number] DOB: 01-Feb-1951 DOA: 11/18/2016  PCP: [facility], FNP  Admit date: 11/18/2016 Discharge date: 11/27/2016  Admitted From: Home Disposition:  Home  Recommendations for Outpatient Follow-up:  1. Follow up with PCP in 1-week 2. Follow bmp in 7 days 3. Will continue torsemide 50 mg po bid 4. Instructed about sodium and fluid restriction 5. Instructed to hold on coreg and continue low dose losartan.   Home Health: No  Equipment/Devices: No   Discharge Condition: Stable CODE STATUS: full  Diet recommendation:  Heart healthy, sodium restriction less than 2 g, and fluid restriction 1000 ml per day  Brief/Interim Summary: (13/23) 66 year old male who presented with dyspnea, lower extremity edema and weight gain. Patient does have this significant past medical history for atrial flutter, systolic heart failure status post AICD and biventricular cardiac resynchronization therapy, coronary artery disease, type 2 diabetes mellitus, and hypertension. Reported worsening dyspnea on exertion, orthopnea, lower extremity edema, which have been refractive to outpatient therapy with increased dose of furosemide. Reported 25 pound increase. On his initial physical examination, blood pressure 96/55, heart rate 56, respiratory rate 16, temperature 97, oxygenation 97%. Moist mucous membranes, lungs had bilateral rails, at bases, no wheezing, heart S1-S2 present, no gallops or murmurs, abdomen soft nontender and non distended, positive lower extremity edema 2+. Sodium 136, potassium 3.7, chloride 103, bicarbonate 25, glucose 189, BUN 31, creatinine 1.38, white count 4.1, hemoglobin 10.4, hematocrit 32.8, platelets 149. His chest film had cardiomegaly, increased lung markings bilaterally. Defibrillator in place. EKG with atrial sensing, V pacing. Left axis deviation.   Patient admitted with systolic heart failure decompensation, acute on chronic.  1.  Acute on chronic systolic heart failure decompensation. Ejection fraction 15 to 20%. Patient was admitted to the medical ward, he was placed in the remote telemetry monitor, he had aggressive diuresis with intravenous furosemide, negative fluid balance was achieved with significant improvement of his symptoms. -14,360 ml of negative fluid balance. His blood pressure remained stable with systolic around 096. Patient will be changed to torsemide, to take twice daily, close follow-up of kidney function and electrolytes needed as an outpatient. Will continue heart failure regimen with carvedilol and low-dose of losartan. Follow-up with heart failure clinic.  2. Atrial flutter. Rate control, continue sotalol for antiarrhythmic and anticoagulation with apixaban.  3. Hypertension. Blood pressure controlled with carvedilol, will resume losartan at discharge. Instructed to hold losartan if blood pressure systolic less than 283 mmHg.   4. Chronic kidney disease stage 2. Patient tolerated well diuresis, his potassium has been repleted. Will continue diuresis with torsemide, and potassium supplements. Will need a close follow-up as an outpatient.   5. Coronary artery disease. She remained chest pain-free, currently not on antiplatelet therapy, patient on apixaban.  6. Type 2 diabetes mellitus. Patient was placed on insulin sliding scale for glucose coverage and monitoring, he was continued on insulin 70/30. Capillary glucose 123, 150, 130. Patient will resume home dosing of insulin regimen as well as metformin and dulaglutide.  09/28- Late entry: Patient continue to be hospitalized for further diuresis, in the nexte 48 hours, further negative fluid balance was achieved up to -19,690 ml, with continue to improvement in his symptoms. Patient will be discharge with low dose losartan and continue torsemide for diuresis. He will need a close follow up of the renal function and electrolytes.    Discharge Diagnoses:   Principal Problem:   Acute on chronic left and right systolic w/ diastolic  heart failure, NYHA class 2 (HCC) Active Problems:   Medtronic biventricular ICD, serial number  [id number]    Hx of atrial flutter   CAD (coronary artery disease)-RCA-T, 70% PL (off CFX), 99% Prox LAD/90% Dist LAD, S/P TAXUS stent x 2   HTN (hypertension)   Diabetes mellitus with complication (HCC)   Thrombocytopenia (HCC)   Acute on chronic combined systolic and diastolic CHF (congestive heart failure) ([place])    Discharge Instructions   Allergies as of 11/25/2016      Reactions   Penicillins Other (See Comments)   Unknown reaction      Medication List    STOP taking these medications   furosemide 80 MG tablet Commonly known as:  LASIX     TAKE these medications   apixaban 5 MG Tabs tablet Commonly known as:  ELIQUIS Take 1 tablet (5 mg total) by mouth 2 (two) times daily.   atorvastatin 80 MG tablet Commonly known as:  LIPITOR TAKE ONE TABLET BY MOUTH ONCE DAILY   carvedilol 3.125 MG tablet Commonly known as:  COREG Take 3.125 mg by mouth 2 (two) times daily with a meal.   Dulaglutide 0.75 MG/0.5ML Sopn Commonly known as:  TRULICITY Inject 9.92 mg into the skin once a week.   gabapentin 300 MG capsule Commonly known as:  NEURONTIN Take 1 tablet in the morning and afternoon and 2 tablets at bedtime What changed:  how much to take  how to take this  when to take this  additional instructions  Another medication with the same name was removed. Continue taking this medication, and follow the directions you see here.   insulin NPH-regular Human (70-30) 100 UNIT/ML injection Commonly known as:  NOVOLIN 70/30 RELION Inject 30 Units into the skin 2 (two) times daily with a meal.   losartan 25 MG tablet Commonly known as:  COZAAR Take 1 tablet (25 mg total) by mouth daily.   magnesium oxide 400 MG tablet Commonly known as:  MAG-OX Take 1 tablet (400 mg total) by mouth daily.    metFORMIN 750 MG 24 hr tablet Commonly known as:  GLUCOPHAGE-XR TAKE TWO TABLETS BY MOUTH ONCE DAILY WITH  BREAKFAST What changed:  See the new instructions.   MULTIVITAMIN PO Take 1 capsule by mouth daily.   potassium chloride SA 20 MEQ tablet Commonly known as:  K-DUR,KLOR-CON Take 1 tablet (20 mEq total) by mouth daily.   sildenafil 20 MG tablet Commonly known as:  REVATIO Take 1-5 tablets (20-100 mg total) by mouth daily as needed.   sotalol 120 MG tablet Commonly known as:  BETAPACE TAKE ONE TABLET BY MOUTH TWICE DAILY What changed:  See the new instructions.   torsemide 100 MG tablet Commonly known as:  DEMADEX Take 0.5 tablets (50 mg total) by mouth daily.            Discharge Care Instructions        Start     Ordered   11/26/16 0000  potassium chloride SA (K-DUR,KLOR-CON) 20 MEQ tablet  Daily     11/25/16 0953   11/25/16 0000  losartan (COZAAR) 25 MG tablet  Daily     11/25/16 0953   11/25/16 0000  Increase activity slowly     11/25/16 0953   11/25/16 0000  Diet - low sodium heart healthy     11/25/16 0953   11/25/16 0000  Discharge instructions    Comments:  Please follow with primary care in 7 days.  11/25/16 0953   11/25/16 0000  torsemide (DEMADEX) 100 MG tablet  Daily     11/25/16 0953      Allergies  Allergen Reactions  . Penicillins Other (See Comments)    Unknown reaction    Consultations:    Procedures/Studies: Dg Chest 2 View  Result Date: 11/18/2016 CLINICAL DATA:  Dry cough, shortness of Breath EXAM: CHEST  2 VIEW COMPARISON:  02/06/2014 FINDINGS: Left AICD remains in place, unchanged. Cardiomegaly. Mild vascular congestion and bibasilar atelectasis. Mild peribronchial thickening. No effusions or acute bony abnormality. IMPRESSION: Mild bronchitic changes, bibasilar atelectasis. Electronically Signed   By: Rolm Baptise M.D.   On: 11/18/2016 09:42       Subjective: Patient feeling better, no dyspnea or chest pain, no nausea  or vomiting, has improved lower extremity edema.   Discharge Exam: Vitals:   11/25/16 0508 11/25/16 0745  BP: 103/67 101/67  Pulse: (!) 56 (!) 55  Resp: 20 19  Temp: (!) 97.5 F (36.4 C) (!) 97.3 F (36.3 C)  SpO2: 92% 98%   Vitals:   11/24/16 1600 11/24/16 2130 11/25/16 0508 11/25/16 0745  BP: 102/66 108/83 103/67 101/67  Pulse: (!) 56 67 (!) 56 (!) 55  Resp: 17 18 20 19   Temp: 97.8 F (36.6 C) (!) 97.4 F (36.3 C) (!) 97.5 F (36.4 C) (!) 97.3 F (36.3 C)  TempSrc: Oral Oral Oral Oral  SpO2: 96% 97% 92% 98%  Weight:   103.8 kg (228 lb 14.4 oz)   Height:        General: Pt is alert, awake, not in acute distress E ENT; no pallor or icterus, oral mucosa moist.  Cardiovascular: RRR, S1/S2 +, no rubs, no gallops Respiratory: CTA bilaterally, no wheezing, no rhonchi Abdominal: Soft, NT, ND, bowel sounds + Extremities: ++/ +++ lower extremitty edema, no cyanosis    The results of significant diagnostics from this hospitalization (including imaging, microbiology, ancillary and laboratory) are listed below for reference.     Microbiology: No results found for this or any previous visit (from the past 240 hour(s)).   Labs: BNP (last 3 results)  Recent Labs  11/18/16 1355  BNP 258.5*   Basic Metabolic Panel:  Recent Labs Lab 11/21/16 0452 11/22/16 0612 11/23/16 0425 11/24/16 0535 11/25/16 0610  NA 136 134* 138 133* 135  K 3.4* 3.5 2.8* 3.2* 3.0*  CL 101 97* 96* 91* 91*  CO2 25 28 31 29  32  GLUCOSE 84 137* 93 137* 113*  BUN 33* 36* 36* 39* 38*  CREATININE 1.37* 1.48* 1.38* 1.33* 1.26*  CALCIUM 9.0 9.3 9.7 9.4 9.4  MG  --  1.9  --  1.7  --    Liver Function Tests: No results for input(s): AST, ALT, ALKPHOS, BILITOT, PROT, ALBUMIN in the last 168 hours. No results for input(s): LIPASE, AMYLASE in the last 168 hours. No results for input(s): AMMONIA in the last 168 hours. CBC:  Recent Labs Lab 11/24/16 0535  WBC 4.3  HGB 12.1*  HCT 36.7*  MCV 85.3   PLT 227   Cardiac Enzymes: No results for input(s): CKTOTAL, CKMB, CKMBINDEX, TROPONINI in the last 168 hours. BNP: Invalid input(s): POCBNP CBG:  Recent Labs Lab 11/24/16 0801 11/24/16 1222 11/24/16 1658 11/24/16 2157 11/25/16 0733  GLUCAP 112* 151* 123* 150* 130*   D-Dimer No results for input(s): DDIMER in the last 72 hours. Hgb A1c No results for input(s): HGBA1C in the last 72 hours. Lipid Profile No results for input(s):  CHOL, HDL, LDLCALC, TRIG, CHOLHDL, LDLDIRECT in the last 72 hours. Thyroid function studies No results for input(s): TSH, T4TOTAL, T3FREE, THYROIDAB in the last 72 hours.  Invalid input(s): FREET3 Anemia work up No results for input(s): VITAMINB12, FOLATE, FERRITIN, TIBC, IRON, RETICCTPCT in the last 72 hours. Urinalysis    Component Value Date/Time   COLORURINE YELLOW 05/22/2012 1810   APPEARANCEUR CLEAR 05/22/2012 1810   LABSPEC 1.014 05/22/2012 1810   PHURINE 5.0 05/22/2012 1810   GLUCOSEU NEGATIVE 05/22/2012 1810   HGBUR NEGATIVE 05/22/2012 1810   BILIRUBINUR NEGATIVE 05/22/2012 1810   KETONESUR NEGATIVE 05/22/2012 1810   PROTEINUR NEGATIVE 05/22/2012 1810   UROBILINOGEN 1.0 05/22/2012 1810   NITRITE NEGATIVE 05/22/2012 1810   LEUKOCYTESUR NEGATIVE 05/22/2012 1810   Sepsis Labs Invalid input(s): PROCALCITONIN,  WBC,  LACTICIDVEN Microbiology No results found for this or any previous visit (from the past 240 hour(s)).   Time coordinating discharge: 45 minutes  SIGNED:   Tawni Millers, MD  [facility] 11/25/2016, 9:27 AM Pager [phone]  If 7PM-7AM, please contact night-coverage [email or web address] Password [email or web address]

## 2016-11-25 NOTE — Telephone Encounter (Signed)
Make sure pt has fu appt with me in next 2 weeks Kirk Ruths

## 2016-11-25 NOTE — Progress Notes (Addendum)
Heart Failure Navigator Consult Note  Presentation:Per Dr. Cathlean [name]- [name] [name] is a 66 year old male who presented with dyspnea, lower extremity edema and weight gain. Patient does have this significant past medical history for atrial flutter, systolic heart failure status post AICD and biventricular cardiac resynchronization therapy, coronary artery disease, type 2 diabetes mellitus, and hypertension. Reported worsening dyspnea on exertion, orthopnea, lower extremity edema, which have been refractive to outpatient therapy with increased dose of furosemide. Reported 25 pound increase. On his initial physical examination, blood pressure 96/55, heart rate 56, respiratory rate 16, temperature 97, oxygenation 97%. Moist mucous membranes, lungs had bilateral rails, at bases, no wheezing, heart S1-S2 present, no gallops or murmurs, abdomen soft nontender and non distended, positive lower extremity edema 2+.   Past Medical History:  Diagnosis Date  . AICD (automatic cardioverter/defibrillator) present 02/05/2014   Upgrade to Medtronic biventricular ICD, serial number  BLD [id number]   . Atrial flutter ([place]) 04/2012   s/p TEE-EPS+RFCA 04/2012  . CAD (coronary artery disease) [id number],[phone] X 2    RCA-T, 70% PL (off CFX), 99% Prox LAD/90% Dist LAD, S/P TAXUS stent x 2  . CHF (congestive heart failure) ([place])   . Chronic anticoagulation   . Chronic systolic heart failure ([place])   . CKD (chronic kidney disease)   . Diabetic retinopathy ([place])   . DM type 2 (diabetes mellitus, type 2) (HCC)    insulin dependent  . HTN (hypertension)   . Hypercholesteremia    ablation  . ICD (implantable cardiac defibrillator) in place   . Ischemic cardiomyopathy March 2015   20-25% 2D   . Nephrolithiasis   . Ventricular tachycardia [facility])     Social History   Social History  . Marital status: Married    Spouse name: N/A  . Number of children: 4  . Years of education: 14   Occupational History  . brick layer -  currently unemployed    Social History Main Topics  . Smoking status: Former Smoker    Packs/day: 1.00    Years: 29.00    Types: Cigarettes    Quit date: 03/02/2000  . Smokeless tobacco: Never Used  . Alcohol use No  . Drug use: No  . Sexual activity: Not Asked   Other Topics Concern  . None   Social History Narrative   Pt lives with wife in a one story home - married for 65 years. He hunts regularly and is active, walking > 1 mile at times without difficulty.  Has 4 children.     Retired Horticulturist, commercial.  Education: some college.    ECHO:Study Conclusions--11/19/16  - Left ventricle: The cavity size was mildly dilated. There was   moderate concentric hypertrophy. Systolic function was severely   reduced. The estimated ejection fraction was in the range of 15%   to 20%. Diffuse hypokinesis. - Ventricular septum: Septal motion showed paradox. - Aortic valve: Trileaflet; mildly thickened, mildly calcified   leaflets. Transvalvular velocity was within the normal range.   There was no stenosis. There was trivial regurgitation. - Mitral valve: There was moderate regurgitation. - Left atrium: The atrium was moderately dilated. - Right ventricle: The cavity size was mildly dilated. Wall   thickness was normal. Systolic function was moderately reduced. - Right atrium: The atrium was normal in size. - Tricuspid valve: There was moderate regurgitation. - Pulmonary arteries: Systolic pressure was moderately increased.   PA peak pressure: 44 mm Hg (S). - Line: A venous catheter was visualized in  the superior vena cava,   with its tip in the right atrium. No abnormal features noted. - Pericardium, extracardiac: There was no pericardial effusion.  Impressions:  - Since the last study on 02/11/15 LVEF has decreased from 20-25%   to 15-20%.  ------------------------------------------------------------------- Study data:  Comparison was made to the study of 02/11/2015.  Study status:   Routine.  Procedure:  The patient reported no pain pre or post test. Transthoracic echocardiography. Image quality was adequate.  Study completion:  There were no complications. Transthoracic echocardiography.  M-mode, complete 2D, spectral Doppler, and color Doppler.  Birthdate:  Patient birthdate: 13-Dec-1950.  Age:  Patient is 66 yr old.  Sex:  Gender: male. BMI: 34.7 kg/m^2.  Blood pressure:     95/58  Patient status: Inpatient.  Study date:  Study date: 11/19/2016. Study time: 08:15 AM.  Location:  Bedside.   BNP    Component Value Date/Time   BNP 584.3 (H) 11/18/2016 1355   BNP 372.0 (H) 01/28/2015 1508    ProBNP    Component Value Date/Time   PROBNP 388.0 (H) 11/12/2016 1559     Education Assessment and Provision:  Detailed education and instructions provided on heart failure disease management including the following:  Signs and symptoms of Heart Failure When to call the physician Importance of daily weights Low sodium diet Fluid restriction Medication management Anticipated future follow-up appointments  Patient education given on each of the above topics.  Patient acknowledges understanding and acceptance of all instructions.  I spoke with the patient and his wife regarding his current admission and HF diagnosis.  He tells me that he has had HF "for years".   He sees Dr. Stanford [name] as an outpatient.  He recently saw his PCP and had swelling --they increased his Lasix and he did not have any improvement therefore came to the ED.  He admits that he does not always eat "right" --(low sodium).  He has a scale and weighs each day.  I reviewed the importance of daily weights as well as when to contact the physician.  I also reviewed a low sodium diet and high sodium foods to avoid.  He says he intends to make better food choices after discharge.  He tells me that he has no barriers getting or taking prescribed medications.  I will insure that he has follow-up with Dr. Stanford [name]  after discharge.     Education Materials:  "Living Better With Heart Failure" Booklet, Daily Weight Tracker Tool  High Risk Criteria for Readmission and/or Poor Patient Outcomes:  (Recommend Follow-up with Advanced Heart Failure Clinic)--yes patient would benefit from AHF Clinic F/U.     EF <30%- yes decreased from prior echo 15-20%  2 or more admissions in 6 months-no  Difficult social situation- no  Demonstrates medication noncompliance- denies   Barriers of Care:  Knowledge and compliance  Discharge Planning:  Plans to return to home with wife in [place].

## 2016-11-26 DIAGNOSIS — I2510 Atherosclerotic heart disease of native coronary artery without angina pectoris: Secondary | ICD-10-CM

## 2016-11-26 LAB — BASIC METABOLIC PANEL
Anion gap: 11 (ref 5–15)
BUN: 39 mg/dL — AB (ref 6–20)
CHLORIDE: 88 mmol/L — AB (ref 101–111)
CO2: 34 mmol/L — ABNORMAL HIGH (ref 22–32)
Calcium: 9.9 mg/dL (ref 8.9–10.3)
Creatinine, Ser: 1.38 mg/dL — ABNORMAL HIGH (ref 0.61–1.24)
GFR calc non Af Amer: 52 mL/min — ABNORMAL LOW (ref >60)
GFR, EST AFRICAN AMERICAN: 60 mL/min — AB (ref >60)
Glucose, Bld: 185 mg/dL — ABNORMAL HIGH (ref 65–99)
POTASSIUM: 4.3 mmol/L (ref 3.5–5.1)
SODIUM: 133 mmol/L — AB (ref 135–145)

## 2016-11-26 LAB — GLUCOSE, CAPILLARY
GLUCOSE-CAPILLARY: 180 mg/dL — AB (ref 65–99)
GLUCOSE-CAPILLARY: 225 mg/dL — AB (ref 65–99)
GLUCOSE-CAPILLARY: 148 mg/dL — AB (ref 65–99)
Glucose-Capillary: 204 mg/dL — ABNORMAL HIGH (ref 65–99)

## 2016-11-26 LAB — TYPE AND SCREEN
ABO/RH(D): O POS
Antibody Screen: NEGATIVE

## 2016-11-26 LAB — ABO/RH: ABO/RH(D): O POS

## 2016-11-26 LAB — MAGNESIUM: MAGNESIUM: 2.1 mg/dL (ref 1.7–2.4)

## 2016-11-26 MED ORDER — LOSARTAN POTASSIUM 25 MG PO TABS
12.5000 mg | ORAL_TABLET | Freq: Every day | ORAL | Status: DC
  Administered 2016-11-26 – 2016-11-27 (×2): 12.5 mg via ORAL
  Filled 2016-11-26 (×2): qty 1

## 2016-11-26 NOTE — Progress Notes (Signed)
Advanced Heart Failure Rounding Note  PCP:  Primary Cardiologist:   Subjective:   Yesterday diuresed with IV lasix. Brisk diuresis noted. Weight down 3 pounds.   Complaining of R and LLE pain. Denies SOB. Denies CP.   Objective:   Weight Range: 225 lb 9.6 oz (102.3 kg) Body mass index is 31.46 kg/m.   Vital Signs:   Temp:  [97.6 F (36.4 C)-98.2 F (36.8 C)] 98.2 F (36.8 C) (09/27 0800) Pulse Rate:  [54-58] 54 (09/27 0800) Resp:  [14-18] 14 (09/27 0800) BP: (103-113)/(61-71) 103/61 (09/27 0800) SpO2:  [95 %-98 %] 95 % (09/27 0800) Weight:  [225 lb 9.6 oz (102.3 kg)] 225 lb 9.6 oz (102.3 kg) (09/27 0543) Last BM Date: 11/25/16  Weight change: Filed Weights   11/24/16 0530 11/25/16 0508 11/26/16 0543  Weight: 232 lb 12.8 oz (105.6 kg) 228 lb 14.4 oz (103.8 kg) 225 lb 9.6 oz (102.3 kg)    Intake/Output:   Intake/Output Summary (Last 24 hours) at 11/26/16 0854 Last data filed at 11/26/16 0552  Gross per 24 hour  Intake              600 ml  Output             3800 ml  Net            -3200 ml      Physical Exam    General:  Well appearing. No resp difficulty. In bed  HEENT: Normal Neck: Supple. JVP to jaw . Carotids 2+ bilat; no bruits. No lymphadenopathy or thyromegaly appreciated. Cor: PMI nondisplaced. Regular rate & rhythm. No rubs, gallops or murmurs. Lungs: Clear on room air.  Abdomen: Soft, nontender, nondistended. No hepatosplenomegaly. No bruits or masses. Good bowel sounds. Extremities: No cyanosis, clubbing, rash, R and LLE 2+ edema with ted hose.  Neuro: Alert & orientedx3, cranial nerves grossly intact. moves all 4 extremities w/o difficulty. Affect pleasant   Telemetry   NSR / Sinus Loletha Grayer 50-60s personally reviewed.   EKG    N/A  Labs    CBC  Recent Labs  11/24/16 0535  WBC 4.3  HGB 12.1*  HCT 36.7*  MCV 85.3  PLT 347   Basic Metabolic Panel  Recent Labs  11/24/16 0535 11/25/16 0610 11/26/16 0644  NA 133* 135 133*  K  3.2* 3.0* 4.3  CL 91* 91* 88*  CO2 29 32 34*  GLUCOSE 137* 113* 185*  BUN 39* 38* 39*  CREATININE 1.33* 1.26* 1.38*  CALCIUM 9.4 9.4 9.9  MG 1.7  --  2.1   Liver Function Tests No results for input(s): AST, ALT, ALKPHOS, BILITOT, PROT, ALBUMIN in the last 72 hours. No results for input(s): LIPASE, AMYLASE in the last 72 hours. Cardiac Enzymes No results for input(s): CKTOTAL, CKMB, CKMBINDEX, TROPONINI in the last 72 hours.  BNP: BNP (last 3 results)  Recent Labs  11/18/16 1355  BNP 584.3*    ProBNP (last 3 results)  Recent Labs  11/12/16 1559  PROBNP 388.0*     D-Dimer No results for input(s): DDIMER in the last 72 hours. Hemoglobin A1C No results for input(s): HGBA1C in the last 72 hours. Fasting Lipid Panel No results for input(s): CHOL, HDL, LDLCALC, TRIG, CHOLHDL, LDLDIRECT in the last 72 hours. Thyroid Function Tests No results for input(s): TSH, T4TOTAL, T3FREE, THYROIDAB in the last 72 hours.  Invalid input(s): FREET3  Other results:   Imaging     No results found.   Medications:  Scheduled Medications: . apixaban  5 mg Oral BID  . atorvastatin  80 mg Oral Daily  . furosemide  80 mg Intravenous BID  . gabapentin  600 mg Oral TID  . insulin aspart  0-5 Units Subcutaneous QHS  . insulin aspart  0-9 Units Subcutaneous TID WC  . insulin aspart protamine- aspart  10 Units Subcutaneous BID WC  . magnesium oxide  400 mg Oral Daily  . potassium chloride  40 mEq Oral BID  . sotalol  120 mg Oral BID     Infusions:   PRN Medications:  acetaminophen, diphenhydrAMINE-zinc acetate, lip balm, ondansetron (ZOFRAN) IV    Patient Profile   [name] [name] is a 66 y.o. male with history of chronic systolic CHF due to ICM, [name], [name], [name], [name], HTN, HL, [name]   Pt admitted with worsening SOB and > 20 lb weight gain.   Assessment/Plan   1. Acute on chronic systolic CHF  - EF 37-85%  Biventricular Failure with RV dysfunction. by echo this admit. He has Medtronic BiV ICD in place.  - 9/26 ReDS vest 45% - Volume status improving. Continue lasix 80 mg IV twice a day.   - - Holding BB for now with decompensation.  NO dig with bradycardia.  .- Add 12.5 mg losartan .  - Renal function stable. Check BMET in am.  -2. [name]   Continue ASA and statin - LHC 7/06: EF 35-40%, mLAD 99%, dLAD 90%, mid to dist CFX 70%, RCA occluded. PCI: DES to mid and dist LAD. Carotids 4/12: 0-39% bilat ICA - Had unremarkable Stress test in 2013. Prior apical and inferior infarct. NO S/S ischemia.   3. HTN -Stable today. Continue current regimne.  4. [name] - Per primary 5. CKD III - Creatinine up a little 1.26>1.38. Follow BMET daily.   6. [name] - Goal K > 4.0 and Mg 2.0 - Stable today.   - Continue sotalol 7. PAF s/p RFCA 04/2012 - Noted to be back in Afib for ~ 1 month prior to admission. Maintaining NSR.   - Continue eliquis for [facility] - CHA2DS2/VASc is at least 7 with history of ? TIA.  8. Hypokalemia - Resolved K 4.3  9. Blood Type O+  10 Hyponatremia- sodium 133. Follow daily 11. Neuropathy - on gabapentin      Length of Stay: [place], NP  11/26/2016, 8:54 AM  Advanced Heart Failure Team Pager [phone] (M-F; 7a - 4p)  Please contact [facility] for night-coverage after hours (4p -7a ) and weekends on [email or web address]  Patient seen and examined with Darrick Grinder, NP. We discussed all aspects of the encounter. I agree with the assessment and plan as stated above.   Diuresing well with IV lasix. Renal function stable. Still with some volume overload. Continue IV diuresis today. Maintaining NSR on tele. On Eliquis. Will need close f/u in HF Clinic once discharged with CPX testing to assess need for advanced therapies.   Glori Bickers, MD  1:40 PM

## 2016-11-26 NOTE — Progress Notes (Signed)
PROGRESS NOTE    [name] [name]  SNK:[id number] DOB: [birth year] DOA: 11/18/2016 PCP: [facility], FNP    Brief Narrative:  66 year old male who presented with dyspnea, lower extremity edema and weight gain. Patient does have this significant past medical history for atrial flutter, systolic heart failure status post AICD and biventricular cardiac resynchronization therapy, coronary artery disease, type 2 diabetes mellitus, and hypertension. Reported worsening dyspnea on exertion, orthopnea, lower extremity edema, which have been refractive to outpatient therapy with increased dose of furosemide. Reported 25 pound increase. On his initial physical examination, blood pressure 96/55, heart rate 56, respiratory rate 16, temperature 97, oxygenation 97%. Moist mucous membranes, lungs had bilateral rails, at bases, no wheezing, heart S1-S2 present, no gallops or murmurs, abdomen soft nontender and non distended, positive lower extremity edema 2+. Sodium 136, potassium 3.7, chloride 103, bicarbonate 25, glucose 189, BUN 31, creatinine 1.38, white count 4.1, hemoglobin 10.4, hematocrit 32.8, platelets 149. His chest film had cardiomegaly, increased lung markings bilaterally. Defibrillator in place. EKG with atrial sensing, V pacing. Left axis deviation.   Patient admitted with systolic heart failure decompensation, acute on chronic.  Assessment & Plan:   Principal Problem:   Acute on chronic left and right systolic w/ diastolic heart failure, NYHA class 2 (HCC) Active Problems:   Medtronic biventricular ICD, serial number  [id number]    Hx of atrial flutter   CAD (coronary artery disease)-RCA-T, 70% PL (off CFX), 99% Prox LAD/90% Dist LAD, S/P TAXUS stent x 2   HTN (hypertension)   Diabetes mellitus with complication (HCC)   Thrombocytopenia (HCC)   Acute on chronic combined systolic and diastolic CHF (congestive heart failure) ([place])   1. Decompensated systolic heart failure. Will  continue diuresis with furosemide 80 mg IV bid, had one dose of metolazone yesterday, urine output 4,070 over last 24 hours, systolic blood pressure 98 to 103 with HR in the 50's. Will continue losartan and sotalol. Follow cardiology recommendations.   2. Atrial flutter. Continue rate controlled with sotalol, HR in the 50's, asymptomatic, will continue anticoagulation with apixaban.    3. Hypertension. Resumed low dose losartan, will continue close follow up on blood pressure and renal function.   4. Coronary artery disease. Continue statin and blood pressure control.  5. Type 2 diabetes mellitus. Capillary glucose 172, 205, 180, 225, 204, will continue reduce dose of basal insulin, continue insulin sliding scale for glucose cover and monitoring.   6. Pulmonary hypertension. Continue Oxymetry monitoring.  7. CKD stage 2. Serum cr up to 1,38, will follow on renal panel in am, on high dosed of furosemide, serum K at 4,3 with serum bicarbonate at 34, cw contraction alkalosis.     DVT prophylaxis: scd  Code Status: full Family Communication:  Disposition Plan:    Consultants:   cardiology   Procedures:     Antimicrobials:    Subjective: Patient feeling better, continue to improve lower extremity edema, no nausea or vomiting, no chest pain or dyspnea, has been out of bed to the chair.   Objective: Vitals:   11/25/16 2001 11/26/16 0543 11/26/16 0700 11/26/16 0800  BP: 113/71 104/63  103/61  Pulse: (!) 58 (!) 54  (!) 54  Resp: 18 18 14 14   Temp: 97.9 F (36.6 C) 97.6 F (36.4 C)  98.2 F (36.8 C)  TempSrc: Oral Oral  Oral  SpO2: 98% 98%  95%  Weight:  102.3 kg (225 lb 9.6 oz)    Height:  Intake/Output Summary (Last 24 hours) at 11/26/16 0925 Last data filed at 11/26/16 0552  Gross per 24 hour  Intake              600 ml  Output             3800 ml  Net            -3200 ml   Filed Weights   11/24/16 0530 11/25/16 0508 11/26/16 0543  Weight:  105.6 kg (232 lb 12.8 oz) 103.8 kg (228 lb 14.4 oz) 102.3 kg (225 lb 9.6 oz)    Examination:  General: Not in pain or dyspnea. Neurology: Awake and alert, non focal  E ENT: no pallor, no icterus, oral mucosa moist Cardiovascular: Moderate JVD. S1-S2 present, rhythmic, no gallops, rubs, or murmurs. +++ pitting lower extremity edema. Pulmonary: vesicular breath sounds bilaterally, adequate air movement, no wheezing, rhonchi or rales. Gastrointestinal. Abdomen protuberant, no organomegaly, non tender, no rebound or guarding Skin. No rashes Musculoskeletal: no joint deformities     Data Reviewed: I have personally reviewed following labs and imaging studies  CBC:  Recent Labs Lab 11/24/16 0535  WBC 4.3  HGB 12.1*  HCT 36.7*  MCV 85.3  PLT 646   Basic Metabolic Panel:  Recent Labs Lab 11/22/16 0612 11/23/16 0425 11/24/16 0535 11/25/16 0610 11/26/16 0644  NA 134* 138 133* 135 133*  K 3.5 2.8* 3.2* 3.0* 4.3  CL 97* 96* 91* 91* 88*  CO2 28 31 29  32 34*  GLUCOSE 137* 93 137* 113* 185*  BUN 36* 36* 39* 38* 39*  CREATININE 1.48* 1.38* 1.33* 1.26* 1.38*  CALCIUM 9.3 9.7 9.4 9.4 9.9  MG 1.9  --  1.7  --  2.1   GFR: Estimated Creatinine Clearance: 64.1 mL/min (A) (by C-G formula based on SCr of 1.38 mg/dL (H)). Liver Function Tests: No results for input(s): AST, ALT, ALKPHOS, BILITOT, PROT, ALBUMIN in the last 168 hours. No results for input(s): LIPASE, AMYLASE in the last 168 hours. No results for input(s): AMMONIA in the last 168 hours. Coagulation Profile: No results for input(s): INR, PROTIME in the last 168 hours. Cardiac Enzymes: No results for input(s): CKTOTAL, CKMB, CKMBINDEX, TROPONINI in the last 168 hours. BNP (last 3 results)  Recent Labs  11/12/16 1559  PROBNP 388.0*   HbA1C: No results for input(s): HGBA1C in the last 72 hours. CBG:  Recent Labs Lab 11/25/16 0733 11/25/16 1119 11/25/16 1612 11/25/16 2110 11/26/16 0728  GLUCAP 130* 209*  172* 205* 180*   Lipid Profile: No results for input(s): CHOL, HDL, LDLCALC, TRIG, CHOLHDL, LDLDIRECT in the last 72 hours. Thyroid Function Tests: No results for input(s): TSH, T4TOTAL, FREET4, T3FREE, THYROIDAB in the last 72 hours. Anemia Panel: No results for input(s): VITAMINB12, FOLATE, FERRITIN, TIBC, IRON, RETICCTPCT in the last 72 hours.    Radiology Studies: I have reviewed all of the imaging during this hospital visit personally     Scheduled Meds: . apixaban  5 mg Oral BID  . atorvastatin  80 mg Oral Daily  . furosemide  80 mg Intravenous BID  . gabapentin  600 mg Oral TID  . insulin aspart  0-5 Units Subcutaneous QHS  . insulin aspart  0-9 Units Subcutaneous TID WC  . insulin aspart protamine- aspart  10 Units Subcutaneous BID WC  . magnesium oxide  400 mg Oral Daily  . potassium chloride  40 mEq Oral BID  . sotalol  120 mg Oral BID   Continuous  Infusions:   LOS: 7 days        Nidal Rivet Gerome Apley, MD [facility] Pager [phone]

## 2016-11-26 NOTE — Progress Notes (Signed)
Inpatient Diabetes Program Recommendations  AACE/ADA: New Consensus Statement on Inpatient Glycemic Control (2015)  Target Ranges:  Prepandial:   less than 140 mg/dL      Peak postprandial:   less than 180 mg/dL (1-2 hours)      Critically ill patients:  140 - 180 mg/dL  Results for HASAN, DOUSE (MRN [id number]) as of 11/26/2016 09:07  Ref. Range 11/25/2016 07:33 11/25/2016 11:19 11/25/2016 16:12 11/25/2016 21:10 11/26/2016 07:28  Glucose-Capillary Latest Ref Range: 65 - 99 mg/dL 130 (H) 209 (H) 172 (H) 205 (H) 180 (H)    Review of Glycemic Control Outpatient DM medications: 70/30 30 units BID, Trulicity 9.78 mg Q week, Metformin XR 750 mg BID Current orders for Inpatient glycemic control: 70/30 10 units BID, Novolog 0-9 units TID with meals, Novolog 0-5 units QHS  Inpatient Diabetes Program Recommendations: Insulin - Basal: Please consider increasing 70/30 to 12 units BID.  Thanks, Barnie Alderman, RN, MSN, CDE Diabetes Coordinator Inpatient Diabetes Program [phone] (Team Pager from 8am to 5pm)

## 2016-11-26 NOTE — Plan of Care (Signed)
Problem: Physical Regulation: Goal: Ability to maintain clinical measurements within normal limits will improve Outcome: Progressing BP and pulse stable   Problem: Tissue Perfusion: Goal: Risk factors for ineffective tissue perfusion will decrease Outcome: Progressing Maintains oxygen level in the 90's on room air

## 2016-11-26 NOTE — Progress Notes (Signed)
Pt sound asleep, has been walking earlier today. Left HF booklet, will f/u tomorrow. Yves Dill CES, ACSM 3:17 PM 11/26/2016

## 2016-11-26 NOTE — Progress Notes (Signed)
Patient without complaint 7 a to 7 p shift, BP remains soft but stable.  HR remains in 50's.  Patient ambulates frequently in halls, continues to diurese.  Family at bedside.

## 2016-11-27 LAB — CBC
HCT: 37.7 % — ABNORMAL LOW (ref 39.0–52.0)
Hemoglobin: 11.7 g/dL — ABNORMAL LOW (ref 13.0–17.0)
MCH: 26.2 pg (ref 26.0–34.0)
MCHC: 31 g/dL (ref 30.0–36.0)
MCV: 84.5 fL (ref 78.0–100.0)
PLATELETS: 229 10*3/uL (ref 150–400)
RBC: 4.46 MIL/uL (ref 4.22–5.81)
RDW: 15 % (ref 11.5–15.5)
WBC: 3.1 10*3/uL — AB (ref 4.0–10.5)

## 2016-11-27 LAB — BASIC METABOLIC PANEL
Anion gap: 11 (ref 5–15)
BUN: 45 mg/dL — ABNORMAL HIGH (ref 6–20)
CALCIUM: 9.5 mg/dL (ref 8.9–10.3)
CHLORIDE: 91 mmol/L — AB (ref 101–111)
CO2: 31 mmol/L (ref 22–32)
CREATININE: 1.36 mg/dL — AB (ref 0.61–1.24)
GFR, EST NON AFRICAN AMERICAN: 53 mL/min — AB (ref >60)
Glucose, Bld: 124 mg/dL — ABNORMAL HIGH (ref 65–99)
Potassium: 3.6 mmol/L (ref 3.5–5.1)
SODIUM: 133 mmol/L — AB (ref 135–145)

## 2016-11-27 LAB — GLUCOSE, CAPILLARY
GLUCOSE-CAPILLARY: 143 mg/dL — AB (ref 65–99)
GLUCOSE-CAPILLARY: 160 mg/dL — AB (ref 65–99)

## 2016-11-27 MED ORDER — POTASSIUM CHLORIDE CRYS ER 20 MEQ PO TBCR
40.0000 meq | EXTENDED_RELEASE_TABLET | Freq: Every day | ORAL | Status: DC
  Administered 2016-11-27: 40 meq via ORAL
  Filled 2016-11-27: qty 2

## 2016-11-27 MED ORDER — LOSARTAN POTASSIUM 25 MG PO TABS: 12.5000 mg | ORAL_TABLET | Freq: Every day | ORAL | 0 refills | Status: DC

## 2016-11-27 NOTE — Progress Notes (Signed)
Patient provided a map and detailed directions to follow-up app in the [facility].  His AHF Clinic appt is scheduled on 12/04/16 at 0930.

## 2016-11-27 NOTE — Progress Notes (Addendum)
Patient with ReDs vest reading today.  Measurement is 43%.

## 2016-11-27 NOTE — Progress Notes (Signed)
Inpatient Diabetes Program Recommendations  AACE/ADA: New Consensus Statement on Inpatient Glycemic Control (2015)  Target Ranges:  Prepandial:   less than 140 mg/dL      Peak postprandial:   less than 180 mg/dL (1-2 hours)      Critically ill patients:  140 - 180 mg/dL   Lab Results  Component Value Date   GLUCAP 143 (H) 11/27/2016   HGBA1C 7.5 (H) 11/18/2016    Review of Glycemic ControlResults for [name] [name], LOZINSKI (MRN [id number]) as of 11/27/2016 10:31  Ref. Range 11/26/2016 07:28 11/26/2016 11:16 11/26/2016 16:11 11/26/2016 22:08 11/27/2016 07:39  Glucose-Capillary Latest Ref Range: 65 - 99 mg/dL 180 (H) 225 (H) 204 (H) 148 (H) 143 (H)   Diabetes history: Type 2 diabetes Outpatient Diabetes medications: Trulicity .75 mg once a week, Novolin 70/30 30 units bid, Metformin 750 mg bid Current orders for Inpatient glycemic control:  Novolin 70/30 10 units bid, Novolog sensitive tid with meals and HS  Inpatient Diabetes Program Recommendations: Please increase Novolog 70/30 to 15 units bid.    Thanks, Adah Perl, RN, BC-ADM Inpatient Diabetes Coordinator Pager [phone] (8a-5p)

## 2016-11-27 NOTE — Progress Notes (Deleted)
HPI: FU CAD (s/p prior PCI of RCA and LAD), ICM, systolic CHF, VTach s/p ICD Rx 04/2010 (Sotalol added to regimen), [name], HTN, HL, CKD. LHC 7/06: EF 35-40%, mLAD 99%, dLAD 90%, mid to dist CFX 70%, RCA occluded. PCI: DES to mid and dist LAD. Carotids 4/12: 0-39% bilat ICA. Previous atrial flutter ablation. Nuclear study October 2015 showed an ejection fraction of 30%. There was scar but no ischemia. Has had problems with acute on chronic systolic congestive heart failure. Has had upgrade of his ICD to a biventricular device. Pt noted to have PAF on his device and started on apixaban. ABIs April 2018 moderately reduced on left and normal on right. September 2018 showed ejection fraction 15-20%. There was moderate mitral regurgitation, moderate left atrial enlargement, mild right ventricular enlargement with moderately reduced function and moderate tricuspid regurgitation. Patient admitted September 2018 with acute on chronic systolic congestive heart failure. He was diuresed with improvement. Since he was last seen,   No current facility-administered medications for this visit.    Current Outpatient Prescriptions  Medication Sig Dispense Refill  . losartan (COZAAR) 25 MG tablet Take 1 tablet (25 mg total) by mouth daily. 90 tablet 3  . potassium chloride SA (K-DUR,KLOR-CON) 20 MEQ tablet Take 1 tablet (20 mEq total) by mouth daily. 30 tablet 0  . torsemide (DEMADEX) 100 MG tablet Take 0.5 tablets (50 mg total) by mouth daily. 15 tablet 0   Facility-Administered Medications Ordered in Other Visits  Medication Dose Route Frequency Provider Last Rate Last Dose  . acetaminophen (TYLENOL) tablet 650 mg  650 mg Oral Q4H PRN Samella Parr, NP      . apixaban (ELIQUIS) tablet 5 mg  5 mg Oral BID Samella Parr, NP   5 mg at 11/26/16 2254  . atorvastatin (LIPITOR) tablet 80 mg  80 mg Oral Daily Erin Hearing L, NP   80 mg at 11/26/16 1017  . diphenhydrAMINE-zinc acetate (BENADRYL) 2-0.1 % cream    Topical BID PRN Lovey Newcomer T, NP   1 application at 32/99/24 2200  . furosemide (LASIX) injection 80 mg  80 mg Intravenous BID Shirley Friar, PA-C   80 mg at 11/26/16 1800  . gabapentin (NEURONTIN) capsule 600 mg  600 mg Oral TID Barton Dubois, MD   600 mg at 11/26/16 2254  . insulin aspart (novoLOG) injection 0-5 Units  0-5 Units Subcutaneous QHS Samella Parr, NP   2 Units at 11/25/16 2208  . insulin aspart (novoLOG) injection 0-9 Units  0-9 Units Subcutaneous TID WC Samella Parr, NP   1 Units at 11/27/16 [phone]  . insulin aspart protamine- aspart (NOVOLOG MIX 70/30) injection 10 Units  10 Units Subcutaneous BID WC Arrien, Jimmy Picket, MD   10 Units at 11/27/16 [phone]  . lip balm (BLISTEX) ointment   Topical PRN Blount, Scarlette Shorts T, NP      . losartan (COZAAR) tablet 12.5 mg  12.5 mg Oral Daily Clegg, Amy D, NP   12.5 mg at 11/26/16 1301  . magnesium oxide (MAG-OX) tablet 400 mg  400 mg Oral Daily Waldemar Dickens, MD   400 mg at 11/26/16 1018  . ondansetron (ZOFRAN) injection 4 mg  4 mg Intravenous Q6H PRN Samella Parr, NP      . potassium chloride SA (K-DUR,KLOR-CON) CR tablet 40 mEq  40 mEq Oral Daily Arrien, Jimmy Picket, MD      . sotalol (BETAPACE) tablet 120 mg  120  mg Oral BID Samella Parr, NP   120 mg at 11/26/16 2254     Past Medical History:  Diagnosis Date  . AICD (automatic cardioverter/defibrillator) present 02/05/2014   Upgrade to Medtronic biventricular ICD, serial number  BLD [id number]   . Atrial flutter ([place]) 04/2012   s/p TEE-EPS+RFCA 04/2012  . CAD (coronary artery disease) 1610,9604 X 2    RCA-T, 70% PL (off CFX), 99% Prox LAD/90% Dist LAD, S/P TAXUS stent x 2  . CHF (congestive heart failure) ([place])   . Chronic anticoagulation   . Chronic systolic heart failure ([place])   . CKD (chronic kidney disease)   . Diabetic retinopathy ([place])   . DM type 2 (diabetes mellitus, type 2) (HCC)    insulin dependent  . HTN (hypertension)   .  Hypercholesteremia    ablation  . ICD (implantable cardiac defibrillator) in place   . Ischemic cardiomyopathy March 2015   20-25% 2D   . Nephrolithiasis   . Ventricular tachycardia [facility])     Past Surgical History:  Procedure Laterality Date  . ATRIAL FLUTTER ABLATION N/A 05/19/2012   Procedure: ATRIAL FLUTTER ABLATION;  Surgeon: Thompson Grayer, MD;  Location: [facility] CATH LAB;  Service: Cardiovascular;  Laterality: N/A;  . BI-VENTRICULAR IMPLANTABLE CARDIOVERTER DEFIBRILLATOR UPGRADE N/A 02/05/2014   Procedure: BI-VENTRICULAR IMPLANTABLE CARDIOVERTER DEFIBRILLATOR UPGRADE;  Surgeon: Evans Lance, MD;  Location: [facility] CATH LAB;  Service: Cardiovascular;  Laterality: N/A;  . BIV ICD GENERTAOR CHANGE OUT  02/05/2014   Upgrade to Medtronic biventricular ICD, serial number  [id number] by Dr. [name]  . CARDIAC DEFIBRILLATOR PLACEMENT  2007    Medtronic Maximo VR, serial number [id number]  . PERCUTANEOUS CORONARY STENT INTERVENTION (PCI-S)  January 2002   PTCA/Stent Distal RCA  . PERCUTANEOUS CORONARY STENT INTERVENTION (PCI-S)  June 2002   PTCA/Stent x 3 RCA, thrombolysis - failed  . PERCUTANEOUS CORONARY STENT INTERVENTION (PCI-S)  July 2006   TAXUS stents to prox and distal LAD    Social History   Social History  . Marital status: Married    Spouse name: N/A  . Number of children: 4  . Years of education: 14   Occupational History  . brick layer - currently unemployed    Social History Main Topics  . Smoking status: Former Smoker    Packs/day: 1.00    Years: 29.00    Types: Cigarettes    Quit date: 03/02/2000  . Smokeless tobacco: Never Used  . Alcohol use No  . Drug use: No  . Sexual activity: Not on file   Other Topics Concern  . Not on file   Social History Narrative   Pt lives with wife in a one story home - married for 23 years. He hunts regularly and is active, walking > 1 mile at times without difficulty.  Has 4 children.     Retired Horticulturist, commercial.  Education: some  college.    Family History  Problem Relation Age of Onset  . Heart failure Father        Deceased  . Alzheimer's disease Mother        Living  . Heart attack Mother   . CAD Brother   . CAD Brother   . CAD Brother   . CAD Brother   . Healthy Son   . Healthy Daughter   . Diabetes Brother     ROS: no fevers or chills, productive cough, hemoptysis, dysphasia, odynophagia, melena, hematochezia, dysuria, hematuria, rash, seizure activity, orthopnea,  PND, pedal edema, claudication. Remaining systems are negative.  Physical Exam: Well-developed well-nourished in no acute distress.  Skin is warm and dry.  HEENT is normal.  Neck is supple.  Chest is clear to auscultation with normal expansion.  Cardiovascular exam is regular rate and rhythm.  Abdominal exam nontender or distended. No masses palpated. Extremities show no edema. neuro grossly intact  ECG- personally reviewed  A/P  1  Kirk Ruths, MD

## 2016-11-27 NOTE — Progress Notes (Signed)
Advanced Heart Failure Rounding Note  PCP:  Primary Cardiologist:   Subjective:    Continues to diurese well with IV lasix. Breathing better. Walking halls. Urine remains clear. SBP 80-90s. No dizziness. Weight unchanged.    Objective:   Weight Range: 102.1 kg (225 lb) Body mass index is 31.38 kg/m.   Vital Signs:   Temp:  [97.6 F (36.4 C)-98.2 F (36.8 C)] 98.1 F (36.7 C) (09/28 0807) Pulse Rate:  [50-71] 54 (09/28 0807) Resp:  [14-20] 20 (09/28 0525) BP: (87-103)/(53-68) 103/67 (09/28 0807) SpO2:  [97 %-99 %] 99 % (09/28 0807) Weight:  [102.1 kg (225 lb)] 102.1 kg (225 lb) (09/28 0525) Last BM Date: 11/26/16  Weight change: Filed Weights   11/25/16 0508 11/26/16 0543 11/27/16 0525  Weight: 103.8 kg (228 lb 14.4 oz) 102.3 kg (225 lb 9.6 oz) 102.1 kg (225 lb)    Intake/Output:   Intake/Output Summary (Last 24 hours) at 11/27/16 0925 Last data filed at 11/27/16 0917  Gross per 24 hour  Intake             1400 ml  Output             4250 ml  Net            -2850 ml      Physical Exam    General:  Well appearing. No resp difficulty HEENT: normal Neck: supple. JVP 6 Carotids 2+ bilat; no bruits. No lymphadenopathy or thryomegaly appreciated. Cor: PMI laterally displaced. Regular rate & rhythm. No rubs, gallops or murmurs. Lungs: clear Abdomen: soft, nontender, nondistended. No hepatosplenomegaly. No bruits or masses. Good bowel sounds. Extremities: no cyanosis, clubbing, rash, 1+ foot edema Neuro: alert & orientedx3, cranial nerves grossly intact. moves all 4 extremities w/o difficulty. Affect pleasant   Telemetry   Sinus Loletha Grayer 50-60s Personally reviewed   EKG    N/A  Labs    CBC  Recent Labs  11/27/16 0512  WBC 3.1*  HGB 11.7*  HCT 37.7*  MCV 84.5  PLT 622   Basic Metabolic Panel  Recent Labs  11/26/16 0644 11/27/16 0512  NA 133* 133*  K 4.3 3.6  CL 88* 91*  CO2 34* 31  GLUCOSE 185* 124*  BUN 39* 45*  CREATININE 1.38*  1.36*  CALCIUM 9.9 9.5  MG 2.1  --    Liver Function Tests No results for input(s): AST, ALT, ALKPHOS, BILITOT, PROT, ALBUMIN in the last 72 hours. No results for input(s): LIPASE, AMYLASE in the last 72 hours. Cardiac Enzymes No results for input(s): CKTOTAL, CKMB, CKMBINDEX, TROPONINI in the last 72 hours.  BNP: BNP (last 3 results)  Recent Labs  11/18/16 1355  BNP 584.3*    ProBNP (last 3 results)  Recent Labs  11/12/16 1559  PROBNP 388.0*     D-Dimer No results for input(s): DDIMER in the last 72 hours. Hemoglobin A1C No results for input(s): HGBA1C in the last 72 hours. Fasting Lipid Panel No results for input(s): CHOL, HDL, LDLCALC, TRIG, CHOLHDL, LDLDIRECT in the last 72 hours. Thyroid Function Tests No results for input(s): TSH, T4TOTAL, T3FREE, THYROIDAB in the last 72 hours.  Invalid input(s): FREET3  Other results:   Imaging    No results found.   Medications:     Scheduled Medications: . apixaban  5 mg Oral BID  . atorvastatin  80 mg Oral Daily  . furosemide  80 mg Intravenous BID  . gabapentin  600 mg Oral TID  .  insulin aspart  0-5 Units Subcutaneous QHS  . insulin aspart  0-9 Units Subcutaneous TID WC  . insulin aspart protamine- aspart  10 Units Subcutaneous BID WC  . losartan  12.5 mg Oral Daily  . magnesium oxide  400 mg Oral Daily  . potassium chloride  40 mEq Oral Daily  . sotalol  120 mg Oral BID    Infusions:   PRN Medications: acetaminophen, diphenhydrAMINE-zinc acetate, lip balm, ondansetron (ZOFRAN) IV    Patient Profile   [name] [name] is a 66 y.o. male with history of chronic systolic CHF due to ICM, [name], [name], [name], [name], HTN, HL, [name]   Pt admitted with worsening SOB and > 20 lb weight gain.   Assessment/Plan   1. Acute on chronic systolic CHF  - EF 50-09% Biventricular Failure with RV dysfunction. by echo this admit. He has Medtronic BiV ICD in  place.  - 9/26 ReDS vest 45% - Volume status much improved. Now nearing euvolemia. Will recheck ReDS today. Likely can go home later today with close f/u in HF Clinic for CPX and work-up for possible transplant. (RV function probably will not support LVAD)  - Would keep off carvedilol for now - Continue 12.5 mg losartan as BP tolerates - Renal function stable.  -2. [name]   Continue ASA and statin - LHC 7/06: EF 35-40%, mLAD 99%, dLAD 90%, mid to dist CFX 70%, RCA occluded. PCI: DES to mid and dist LAD. Carotids 4/12: 0-39% bilat ICA - Had unremarkable Stress test in 2013. Prior apical and inferior infarct. NO S/S ischemia.   3. HTN -BP low. Adjust as needed 4. [name] - Per primary 5. CKD III - Stable today 6. [name] - Goal K > 4.0 and Mg 2.0 - Stable today.   - Continue sotalol 7. PAF s/p RFCA 04/2012 - Noted to be back in Afib for ~ 1 month prior to admission. Maintaining NSR.   - Continue eliquis for [facility] - CHA2DS2/VASc is at least 7 with history of ? TIA.  8. Hypokalemia - Resolved  9. Blood Type O+  10 Hyponatremia- sodium 133. Follow daily 11. Neuropathy - on gabapentin    Length of Stay: 8   Glori Bickers, MD  11/27/2016, 9:25 AM  Advanced Heart Failure Team Pager [phone] (M-F; 7a - 4p)  Please contact [facility] for night-coverage after hours (4p -7a ) and weekends on [email or web address]

## 2016-11-27 NOTE — Progress Notes (Signed)
PROGRESS NOTE    [name] [name]  HYW:[id number] DOB: 11/23/1950 DOA: 11/18/2016 PCP: Golden Circle, FNP    Brief Narrative:  66 year old male who presented with dyspnea, lower extremity edema and weight gain. Patient does have this significant past medical history for atrial flutter, systolic heart failure status post AICD and biventricular cardiac resynchronization therapy, coronary artery disease, type 2 diabetes mellitus, and hypertension. Reported worsening dyspnea on exertion, orthopnea, lower extremity edema, which have been refractive to outpatient therapy with increased dose of furosemide. Reported 25 pound increase. On his initial physical examination, blood pressure 96/55, heart rate 56, respiratory rate 16, temperature 97, oxygenation 97%. Moist mucous membranes, lungs had bilateral rails, at bases, no wheezing, heart S1-S2 present, no gallops or murmurs, abdomen soft nontender and non distended, positive lower extremity edema 2+. Sodium 136, potassium 3.7, chloride 103, bicarbonate 25, glucose 189, BUN 31, creatinine 1.38, white count 4.1, hemoglobin 10.4, hematocrit 32.8, platelets 149. His chest film had cardiomegaly, increased lung markings bilaterally. Defibrillator in place. EKG with atrial sensing, V pacing. Left axis deviation.   Patient admitted with systolic heart failure decompensation, acute on chronic.    Assessment & Plan:   Principal Problem:   Acute on chronic left and right systolic w/ diastolic heart failure, NYHA class 2 (HCC) Active Problems:   Medtronic biventricular ICD, serial number  [id number]    Hx of atrial flutter   CAD (coronary artery disease)-RCA-T, 70% PL (off CFX), 99% Prox LAD/90% Dist LAD, S/P TAXUS stent x 2   HTN (hypertension)   Diabetes mellitus with complication (HCC)   Thrombocytopenia (HCC)   Acute on chronic combined systolic and diastolic CHF (congestive heart failure) ([place])   1. Decompensated systolic heart failure. Patient  responding well to diuresis, will discharge home with torsemide and low dose losartan, to follow up with heart failure clinic.   2. Atrial flutter. On sotalol, and apixaban, rate controlled.    3. Hypertension. Tolerating well low dose losartan.   4. Coronary artery disease. Statin with good toleration.   5. Type 2 diabetes mellitus. Will instruct to continue insulin regimen at home.   6. Pulmonary hypertension. Continue Oxymetry monitoring.  7. CKD stage 2. Stable renal function with serum cr at 1,36 with K at 3,6.      DVT prophylaxis:scd Code Status:full Family Communication: Disposition Plan:   Consultants:  cardiology   Procedures:    Antimicrobials:    Subjective: Patient feeling better, improved lower extremity edema, no dyspnea, has been ambulating, no chest pain.  Objective: Vitals:   11/27/16 0525 11/27/16 0807 11/27/16 1151 11/27/16 1208  BP: (!) 87/53 103/67 (!) 97/59 104/62  Pulse: (!) 51 (!) 54 (!) 57 (!) 52  Resp: 20     Temp: 98.2 F (36.8 C) 98.1 F (36.7 C)    TempSrc: Oral Oral    SpO2: 98% 99% 95% 99%  Weight: 102.1 kg (225 lb)     Height:        Intake/Output Summary (Last 24 hours) at 11/27/16 1313 Last data filed at 11/27/16 0917  Gross per 24 hour  Intake             1400 ml  Output             2650 ml  Net            -1250 ml   Filed Weights   11/25/16 0508 11/26/16 0543 11/27/16 0525  Weight: 103.8 kg (228 lb 14.4 oz)  102.3 kg (225 lb 9.6 oz) 102.1 kg (225 lb)    Examination:  General: Not in pain or dyspnea Neurology: Awake and alert, non focal  E ENT: no pallor, no icterus, oral mucosa moist Cardiovascular: Mild JVD. S1-S2 present, rhythmic, no gallops, rubs, or murmurs. ++ pitting  lower extremity edema. Pulmonary: vesicular breath sounds bilaterally, adequate air movement, no wheezing, rhonchi or rales. Gastrointestinal. Abdomen flat, no organomegaly, non tender, no rebound or guarding Skin. No  rashes Musculoskeletal: no joint deformities     Data Reviewed: I have personally reviewed following labs and imaging studies  CBC:  Recent Labs Lab 11/24/16 0535 11/27/16 0512  WBC 4.3 3.1*  HGB 12.1* 11.7*  HCT 36.7* 37.7*  MCV 85.3 84.5  PLT 227 737   Basic Metabolic Panel:  Recent Labs Lab 11/22/16 0612 11/23/16 0425 11/24/16 0535 11/25/16 0610 11/26/16 0644 11/27/16 0512  NA 134* 138 133* 135 133* 133*  K 3.5 2.8* 3.2* 3.0* 4.3 3.6  CL 97* 96* 91* 91* 88* 91*  CO2 28 31 29  32 34* 31  GLUCOSE 137* 93 137* 113* 185* 124*  BUN 36* 36* 39* 38* 39* 45*  CREATININE 1.48* 1.38* 1.33* 1.26* 1.38* 1.36*  CALCIUM 9.3 9.7 9.4 9.4 9.9 9.5  MG 1.9  --  1.7  --  2.1  --    GFR: Estimated Creatinine Clearance: 65 mL/min (A) (by C-G formula based on SCr of 1.36 mg/dL (H)). Liver Function Tests: No results for input(s): AST, ALT, ALKPHOS, BILITOT, PROT, ALBUMIN in the last 168 hours. No results for input(s): LIPASE, AMYLASE in the last 168 hours. No results for input(s): AMMONIA in the last 168 hours. Coagulation Profile: No results for input(s): INR, PROTIME in the last 168 hours. Cardiac Enzymes: No results for input(s): CKTOTAL, CKMB, CKMBINDEX, TROPONINI in the last 168 hours. BNP (last 3 results)  Recent Labs  11/12/16 1559  PROBNP 388.0*   HbA1C: No results for input(s): HGBA1C in the last 72 hours. CBG:  Recent Labs Lab 11/26/16 1116 11/26/16 1611 11/26/16 2208 11/27/16 0739 11/27/16 1149  GLUCAP 225* 204* 148* 143* 160*   Lipid Profile: No results for input(s): CHOL, HDL, LDLCALC, TRIG, CHOLHDL, LDLDIRECT in the last 72 hours. Thyroid Function Tests: No results for input(s): TSH, T4TOTAL, FREET4, T3FREE, THYROIDAB in the last 72 hours. Anemia Panel: No results for input(s): VITAMINB12, FOLATE, FERRITIN, TIBC, IRON, RETICCTPCT in the last 72 hours.    Radiology Studies: I have reviewed all of the imaging during this hospital visit  personally     Scheduled Meds: . apixaban  5 mg Oral BID  . atorvastatin  80 mg Oral Daily  . furosemide  80 mg Intravenous BID  . gabapentin  600 mg Oral TID  . insulin aspart  0-5 Units Subcutaneous QHS  . insulin aspart  0-9 Units Subcutaneous TID WC  . insulin aspart protamine- aspart  10 Units Subcutaneous BID WC  . losartan  12.5 mg Oral Daily  . magnesium oxide  400 mg Oral Daily  . potassium chloride  40 mEq Oral Daily  . sotalol  120 mg Oral BID   Continuous Infusions:   LOS: 8 days        Lalania Haseman Gerome Apley, MD [facility] Pager [phone]

## 2016-11-27 NOTE — Progress Notes (Signed)
CARDIAC REHAB PHASE I   Pt ambulating independently, no complaints. Completed CHF education with pt. Reviewed CHF booklet and zone tool, daily weights, sodium and fluid restrictions and phase 2 cardiac rehab. Pt verbalized understanding, able to perform teach back. Pt declines phase 2 cardiac rehab referral at this time. Pt sitting on edge of bed, call bell within reach.   [place], RN, BSN 11/27/2016 2:04 PM

## 2016-11-27 NOTE — Progress Notes (Signed)
Changed bandage on right lower leg.

## 2016-11-27 NOTE — Progress Notes (Signed)
Patient referred to [facility].  All appropriate paperwork sent via secure email to paramedic team.

## 2016-11-27 NOTE — Progress Notes (Signed)
Pt educated on the factors that contribute to heart failure.

## 2016-12-03 ENCOUNTER — Ambulatory Visit (INDEPENDENT_AMBULATORY_CARE_PROVIDER_SITE_OTHER): Payer: PPO

## 2016-12-03 DIAGNOSIS — I5022 Chronic systolic (congestive) heart failure: Secondary | ICD-10-CM | POA: Diagnosis not present

## 2016-12-03 DIAGNOSIS — Z95810 Presence of automatic (implantable) cardiac defibrillator: Secondary | ICD-10-CM

## 2016-12-03 NOTE — Progress Notes (Signed)
EPIC Encounter for ICM Monitoring  Patient Name: [name] [name] is a 66 y.o. male Date: 12/03/2016 Primary Care Physican: Golden Circle, FNP Primary Cardiologist:[facility]/HF clinic Electrophysiologist: [name] Dry Weight:unknown Bi-V Pacing: 94.2%     Since 25-Nov-2016 Time in AT/AF <0.1 hr/day (<0.1%)             Attempted call to patient and unable to reach.  Left detailed message regarding transmission.  Transmission reviewed.   Patient hospitalized 11/18/2016 to 11/27/2016 for CHF exacerbation and was evaluated by [facility] during hospitalization.   Thoracic impedance normal since hospital discharge.  Prescribed dosage: Torsemide 100 mg take 0.5 tablet (50 mg total) once a day.  Labs: 11/27/2016 Creatinine 1.36, BUN 45, Potassium 3.6, Sodium 133, EGFR 53->60 11/26/2016 Creatinine 1.38, BUN 39, Potassium 4.3, Sodium 133, EGFR 52-60  11/25/2016 Creatinine 1.26, BUN 38, Potassium 3.0, Sodium 135, EGFR 58->60  11/24/2016 Creatinine 1.33, BUN 39, Potassium 3.2, Sodium 133, EGFR 54->60  11/23/2016 Creatinine 1.38, BUN 36, Potassium 2.8, Sodium 138, EGFR 52-60  11/22/2016 Creatinine 1.48, BUN 36, Potassium 3.5, Sodium 134, EGFR 48-55  11/21/2016 Creatinine 1.37, BUN 33, Potassium 3.4, Sodium 136, EGFR 52->60  11/20/2016 Creatinine 1.44, BUN 31, Potassium 4.3, Sodium 140, EGFR 49-57  11/19/2016 Creatinine 1.31, BUN 129, Potassium 4.4, Sodium 137, EGFR 55->60  11/18/2016 Creatinine 1.38, BUN 31, Potassium 3.7, Sodium 136, EGFR 52-60  11/12/2016 Creatinine 1.49, BUN 30, Potassium 4.2, Sodium 138 06/02/2016 Creatinine 1.16, BUN 18, Potassium 4.7, Sodium 137 04/06/2016 Creatinine 0.92, BUN 19, Potassium 4.2, Sodium 138 09/25/2017Creatinine 1.31, BUN 19, Potassium 5.0, Sodium 134  Recommendations:Left voice mail with ICM number and encouraged to call if experiencing fluid symptoms.  Follow-up plan: ICM clinic phone appointment on 12/21/2016 to recheck fluid levels.   Office appointment scheduled 12/04/2016 with HF clinic.  Copy of ICM check sent to Dr. [name] and Dr. [name].   3 month ICM trend: 12/03/2016   AT/AF    1 Year ICM trend:      [name], RN 12/03/2016 12:20 PM

## 2016-12-04 ENCOUNTER — Ambulatory Visit (INDEPENDENT_AMBULATORY_CARE_PROVIDER_SITE_OTHER): Payer: PPO | Admitting: Neurology

## 2016-12-04 ENCOUNTER — Ambulatory Visit: Payer: PPO | Admitting: Cardiology

## 2016-12-04 ENCOUNTER — Telehealth (HOSPITAL_COMMUNITY): Payer: Self-pay

## 2016-12-04 ENCOUNTER — Telehealth: Payer: Self-pay

## 2016-12-04 ENCOUNTER — Encounter: Payer: Self-pay | Admitting: Neurology

## 2016-12-04 ENCOUNTER — Ambulatory Visit (HOSPITAL_COMMUNITY)
Admit: 2016-12-04 | Discharge: 2016-12-04 | Disposition: A | Discharge status: Home / Self Care | Payer: PPO | Source: Ambulatory Visit | Attending: Internal Medicine | Admitting: Internal Medicine

## 2016-12-04 VITALS — BP 124/68 | HR 62 | Ht 71.0 in | Wt 225.0 lb

## 2016-12-04 VITALS — BP 94/64 | HR 59 | Wt 223.8 lb

## 2016-12-04 DIAGNOSIS — N183 Chronic kidney disease, stage 3 unspecified: Secondary | ICD-10-CM

## 2016-12-04 DIAGNOSIS — I10 Essential (primary) hypertension: Secondary | ICD-10-CM | POA: Diagnosis not present

## 2016-12-04 DIAGNOSIS — I480 Paroxysmal atrial fibrillation: Secondary | ICD-10-CM | POA: Diagnosis not present

## 2016-12-04 DIAGNOSIS — I5082 Biventricular heart failure: Secondary | ICD-10-CM | POA: Diagnosis not present

## 2016-12-04 DIAGNOSIS — E11319 Type 2 diabetes mellitus with unspecified diabetic retinopathy without macular edema: Secondary | ICD-10-CM | POA: Diagnosis not present

## 2016-12-04 DIAGNOSIS — Z833 Family history of diabetes mellitus: Secondary | ICD-10-CM | POA: Insufficient documentation

## 2016-12-04 DIAGNOSIS — Z87442 Personal history of urinary calculi: Secondary | ICD-10-CM | POA: Diagnosis not present

## 2016-12-04 DIAGNOSIS — I5022 Chronic systolic (congestive) heart failure: Secondary | ICD-10-CM | POA: Insufficient documentation

## 2016-12-04 DIAGNOSIS — Z8249 Family history of ischemic heart disease and other diseases of the circulatory system: Secondary | ICD-10-CM | POA: Insufficient documentation

## 2016-12-04 DIAGNOSIS — Z7901 Long term (current) use of anticoagulants: Secondary | ICD-10-CM | POA: Insufficient documentation

## 2016-12-04 DIAGNOSIS — E1122 Type 2 diabetes mellitus with diabetic chronic kidney disease: Secondary | ICD-10-CM | POA: Insufficient documentation

## 2016-12-04 DIAGNOSIS — M792 Neuralgia and neuritis, unspecified: Secondary | ICD-10-CM | POA: Diagnosis not present

## 2016-12-04 DIAGNOSIS — Z95810 Presence of automatic (implantable) cardiac defibrillator: Secondary | ICD-10-CM | POA: Insufficient documentation

## 2016-12-04 DIAGNOSIS — I255 Ischemic cardiomyopathy: Secondary | ICD-10-CM | POA: Diagnosis not present

## 2016-12-04 DIAGNOSIS — I2510 Atherosclerotic heart disease of native coronary artery without angina pectoris: Secondary | ICD-10-CM | POA: Insufficient documentation

## 2016-12-04 DIAGNOSIS — I130 Hypertensive heart and chronic kidney disease with heart failure and stage 1 through stage 4 chronic kidney disease, or unspecified chronic kidney disease: Secondary | ICD-10-CM | POA: Insufficient documentation

## 2016-12-04 DIAGNOSIS — Z955 Presence of coronary angioplasty implant and graft: Secondary | ICD-10-CM | POA: Insufficient documentation

## 2016-12-04 DIAGNOSIS — E0842 Diabetes mellitus due to underlying condition with diabetic polyneuropathy: Secondary | ICD-10-CM

## 2016-12-04 DIAGNOSIS — Z794 Long term (current) use of insulin: Secondary | ICD-10-CM | POA: Insufficient documentation

## 2016-12-04 DIAGNOSIS — Z880 Allergy status to penicillin: Secondary | ICD-10-CM | POA: Insufficient documentation

## 2016-12-04 DIAGNOSIS — Z87891 Personal history of nicotine dependence: Secondary | ICD-10-CM | POA: Insufficient documentation

## 2016-12-04 DIAGNOSIS — E7800 Pure hypercholesterolemia, unspecified: Secondary | ICD-10-CM | POA: Diagnosis not present

## 2016-12-04 DIAGNOSIS — Z79899 Other long term (current) drug therapy: Secondary | ICD-10-CM | POA: Diagnosis not present

## 2016-12-04 LAB — BASIC METABOLIC PANEL
Anion gap: 9 (ref 5–15)
BUN: 33 mg/dL — AB (ref 6–20)
CALCIUM: 9.2 mg/dL (ref 8.9–10.3)
CHLORIDE: 101 mmol/L (ref 101–111)
CO2: 26 mmol/L (ref 22–32)
Creatinine, Ser: 1.32 mg/dL — ABNORMAL HIGH (ref 0.61–1.24)
GFR calc non Af Amer: 55 mL/min — ABNORMAL LOW (ref >60)
Glucose, Bld: 153 mg/dL — ABNORMAL HIGH (ref 65–99)
Potassium: 4.1 mmol/L (ref 3.5–5.1)
SODIUM: 136 mmol/L (ref 135–145)

## 2016-12-04 MED ORDER — PREGABALIN 75 MG PO CAPS: 75.0000 mg | ORAL_CAPSULE | Freq: Two times a day (BID) | ORAL | 0 refills | Status: DC

## 2016-12-04 MED ORDER — PREGABALIN 50 MG PO CAPS: 50.0000 mg | ORAL_CAPSULE | Freq: Two times a day (BID) | ORAL | 0 refills | Status: DC

## 2016-12-04 NOTE — Progress Notes (Signed)
Follow-up Visit   Date: 12/04/16    MANVEER GOMES MRN: [id number] DOB: [birth year]   Interim History: [name] [name] is a 66 y.o. right-handed African American male with insulin-dependent diabetes mellitus, atrial flutter, CAD, CHF s/p ICD, hypertension, hyperlipidemia, and CKD returning to the clinic for follow-up of diabetic neuropathy.  The patient was accompanied to the clinic by self.  History of present illness: Starting around October 2015 he was admitted with CHF exacerbation and recalls having severe swelling of the legs. Around the same time, he began noticing tingling pain involving the feet and lower calf which has gradually been worsening over the past year. It is worse after he has been walking all day and seem less painful in the morning. He cannot stand anyone touching his feet, because it irritates it.There is no numbness or weakness.  He endorses difficulty with balance, but has not fallen and walks independently. He has been diabetes for 15+ years and insulin-dependent for most of this time.   He was started on gabapentin and has some mild transient improvement, but despite increasing the dose to 600mg  BID, there was no benefit.   UPDATE 12/04/2016:  He was recently hospitalized for CHF exacerbation.  Unfortunately, despite increasing his gabapentin to 600mg  TID, there was no change in his feet pain.  He would like to try an alternative medication.  There has been no new weakness or falls.  Medications:  Current Outpatient Prescriptions on File Prior to Visit  Medication Sig Dispense Refill  . apixaban (ELIQUIS) 5 MG TABS tablet Take 1 tablet (5 mg total) by mouth 2 (two) times daily. 60 tablet 6  . atorvastatin (LIPITOR) 80 MG tablet TAKE ONE TABLET BY MOUTH ONCE DAILY 90 tablet 3  . Dulaglutide (TRULICITY) 3.23 FT/7.3UK SOPN Inject 0.75 mg into the skin once a week. 4 pen 2  . insulin NPH-regular Human (NOVOLIN 70/30 RELION) (70-30) 100 UNIT/ML  injection Inject 30 Units into the skin 2 (two) times daily with a meal. 10 mL 12  . losartan (COZAAR) 25 MG tablet Take 0.5 tablets (12.5 mg total) by mouth daily. 15 tablet 0  . magnesium oxide (MAG-OX) 400 MG tablet Take 1 tablet (400 mg total) by mouth daily. 90 tablet 3  . metFORMIN (GLUCOPHAGE-XR) 750 MG 24 hr tablet TAKE TWO TABLETS BY MOUTH ONCE DAILY WITH  BREAKFAST (Patient taking differently: TAKE 750MG  BY MOUTH TWICE DAILY) 180 tablet 1  . Multiple Vitamins-Minerals (MULTIVITAMIN PO) Take 1 capsule by mouth daily.     . potassium chloride SA (K-DUR,KLOR-CON) 20 MEQ tablet Take 1 tablet (20 mEq total) by mouth daily. 30 tablet 0  . sildenafil (REVATIO) 20 MG tablet Take 1-5 tablets (20-100 mg total) by mouth daily as needed. 50 tablet 0  . sotalol (BETAPACE) 120 MG tablet TAKE ONE TABLET BY MOUTH TWICE DAILY (Patient taking differently: TAKE 120MG  BY MOUTH TWICE DAILY) 180 tablet 3  . torsemide (DEMADEX) 100 MG tablet Take 0.5 tablets (50 mg total) by mouth daily. 15 tablet 0   No current facility-administered medications on file prior to visit.     Allergies:  Allergies  Allergen Reactions  . Penicillins Other (See Comments)    Unknown reaction    Review of Systems:  CONSTITUTIONAL: No fevers, chills, night sweats, or weight loss.  EYES: No visual changes or eye pain ENT: No hearing changes.  No history of nose bleeds.   RESPIRATORY: No cough, wheezing and shortness of breath.   CARDIOVASCULAR:  Negative for chest pain, and palpitations.   GI: Negative for abdominal discomfort, blood in stools or black stools.  No recent change in bowel habits.   GU:  No history of incontinence.   MUSCLOSKELETAL: No history of joint pain or swelling.  No myalgias.   SKIN: Negative for lesions, rash, and itching.   ENDOCRINE: Negative for cold or heat intolerance, polydipsia or goiter.   PSYCH:  No depression or anxiety symptoms.   NEURO: As Above.   Vital Signs:  BP 124/68   Pulse 62    Ht 5\' 11"  (1.803 m)   Wt 225 lb (102.1 kg)   SpO2 92%   BMI 31.38 kg/m   Neurological Exam: MENTAL STATUS including orientation to time, place, person, recent and remote memory, attention span and concentration, language, and fund of knowledge is normal.  Speech is not dysarthric.  CRANIAL NERVES: Pupils equal round and reactive to light.   Face is symmetric.   MOTOR:  Motor strength is 5/5 in all extremities, except toe flexion and extension 4/5.    SENSORY: Vibration is reduced distal to the ankles bilaterally.   COORDINATION/GAIT:  Gait slightly wide-based and stable; unassisted.   Data: Labs 05/29/2015:  [place] 134, TSH 4.29, HbA1c 11.6*, vitamin B12 751 Lab Results  Component Value Date   HGBA1C 7.5 (H) 11/18/2016    IMPRESSION/PLAN: 1.  Diabetic distal and symmetric polyneuropathy, worsening pain.  HbA1c is much better than before  - No improvement with gabapentin at 600mg  TID, so will switched to Lyrica  - Taper gabapentin by 300mg  every 3 days  - Start Lyrica 50mg  twice daily and titrate to 100mg  BID.  He will call in 2 weeks to determine if he needs to increase to 100mg  BID  - Avoid TCA due to prolonged QTc  2.  Chronic kidney disease  Return to clinic in 3 months  Greater than 50% of this 15 minute visit was spent in counseling and planning of further management.  Thank you for allowing me to participate in patient's care.  If I can answer any additional questions, I would be pleased to do so.    Sincerely,    Jakson Delpilar K. Posey Pronto, DO

## 2016-12-04 NOTE — Telephone Encounter (Signed)
Remote ICM transmission received.  Attempted call to patient and left detailed message regarding transmission and next ICM scheduled for 12/21/2016.  Advised to return call for any fluid symptoms or questions.   

## 2016-12-04 NOTE — Progress Notes (Signed)
Advanced Heart Failure Clinic Note     Primary Cardiologist: Dr. [name]  HPI: [name] [name] is a 66 year old male with a past medical history of chronic systolic CHF due to ICM, [name], [name], [name], [name], [name], HTN, HL, [name]   Pt [facility]/18[facility]16 with worsening SOB and 24 lb weight gain despite his lasix being increased by PCP. Echo 11/19/16 with LVEF 15-20%, Mod MR, Mod LAE, Mild RV dilation and moderately reduced function, Moderate TR, PA peak pressure 44 mm Hg. Device interrogation showed recent bout of AF but was back in NSR. Suspect AF contributing to decompensation. Work up planned for possible transplant, RV function probably will not support LVAD. Discharge weight was 225 pounds on 11/27/16.   [name] [name] returns today for HF follow up. Feeling well, denies SOB with walking around his house, walking outside some without SOB. Denies orthopnea, PND, dizziness. Taking all medications, weights stable at home 220-221 pounds. Drinking more than 2L a day, likes to eat ice. Avoiding salt.    Review of Systems: [y] = yes, [ ]  = no   General: Weight gain [ ] ; Weight loss [ ] ; Anorexia [ ] ; Fatigue [ ] ; Fever [ ] ; Chills [ ] ; Weakness [ ]   Cardiac: Chest pain/pressure [ ] ; Resting SOB [ ] ; Exertional SOB [ ] ; Orthopnea [ ] ; Pedal Edema [ ] ; Palpitations [ ] ; Syncope [ ] ; Presyncope [ ] ; Paroxysmal nocturnal dyspnea[ ]   Pulmonary: Cough [ ] ; Wheezing[ ] ; Hemoptysis[ ] ; Sputum [ ] ; Snoring [ ]   GI: Vomiting[ ] ; Dysphagia[ ] ; Melena[ ] ; Hematochezia [ ] ; Heartburn[ ] ; Abdominal pain [ ] ; Constipation [ ] ; Diarrhea [ ] ; BRBPR [ ]   GU: Hematuria[ ] ; Dysuria [ ] ; Nocturia[ ]   Vascular: Pain in legs with walking [ ] ; Pain in feet with lying flat [ ] ; Non-healing sores [ ] ; Stroke [ ] ; TIA [ ] ; Slurred speech [ ] ;  Neuro: Headaches[ ] ; Vertigo[ ] ; Seizures[ ] ; Paresthesias[ ] ;Blurred vision [ ] ; Diplopia [ ] ; Vision changes [ ]   Ortho/Skin:  Arthritis Blue.Reese ]; Joint pain [ ] ; Muscle pain [ ] ; Joint swelling [ ] ; Back Pain [ ] ; Rash [ ]   Psych: Depression[ ] ; Anxiety[ ]   Heme: Bleeding problems [ ] ; Clotting disorders [ ] ; Anemia [ ]   Endocrine: Diabetes [ ] ; Thyroid dysfunction[ ]    Past Medical History:  Diagnosis Date  . AICD (automatic cardioverter/defibrillator) present 02/05/2014   Upgrade to Medtronic biventricular ICD, serial number  BLD [id number]   . Atrial flutter ([place]) 04/2012   s/p TEE-EPS+RFCA 04/2012  . CAD (coronary artery disease) 7412,8786 X 2    RCA-T, 70% PL (off CFX), 99% Prox LAD/90% Dist LAD, S/P TAXUS stent x 2  . CHF (congestive heart failure) ([place])   . Chronic anticoagulation   . Chronic systolic heart failure ([place])   . CKD (chronic kidney disease)   . Diabetic retinopathy ([place])   . DM type 2 (diabetes mellitus, type 2) (HCC)    insulin dependent  . HTN (hypertension)   . Hypercholesteremia    ablation  . ICD (implantable cardiac defibrillator) in place   . Ischemic cardiomyopathy March 2015   20-25% 2D   . Nephrolithiasis   . Ventricular tachycardia [facility])     Current Outpatient Prescriptions  Medication Sig Dispense Refill  . apixaban (ELIQUIS) 5 MG TABS tablet Take 1 tablet (5 mg total) by mouth 2 (  two) times daily. 60 tablet 6  . atorvastatin (LIPITOR) 80 MG tablet TAKE ONE TABLET BY MOUTH ONCE DAILY 90 tablet 3  . Dulaglutide (TRULICITY) 4.13 KG/4.0NU SOPN Inject 0.75 mg into the skin once a week. 4 pen 2  . gabapentin (NEURONTIN) 300 MG capsule Take 1 tablet in the morning and afternoon and 2 tablets at bedtime (Patient taking differently: Take 600-900 mg by mouth See admin instructions. 600mg  in the morning and 900mg  at bedtime) 360 capsule 3  . insulin NPH-regular Human (NOVOLIN 70/30 RELION) (70-30) 100 UNIT/ML injection Inject 30 Units into the skin 2 (two) times daily with a meal. 10 mL 12  . losartan (COZAAR) 25 MG tablet Take 0.5 tablets (12.5 mg total) by mouth daily. 15 tablet 0  .  magnesium oxide (MAG-OX) 400 MG tablet Take 1 tablet (400 mg total) by mouth daily. 90 tablet 3  . metFORMIN (GLUCOPHAGE-XR) 750 MG 24 hr tablet TAKE TWO TABLETS BY MOUTH ONCE DAILY WITH  BREAKFAST (Patient taking differently: TAKE 750MG  BY MOUTH TWICE DAILY) 180 tablet 1  . Multiple Vitamins-Minerals (MULTIVITAMIN PO) Take 1 capsule by mouth daily.     . potassium chloride SA (K-DUR,KLOR-CON) 20 MEQ tablet Take 1 tablet (20 mEq total) by mouth daily. 30 tablet 0  . sildenafil (REVATIO) 20 MG tablet Take 1-5 tablets (20-100 mg total) by mouth daily as needed. 50 tablet 0  . sotalol (BETAPACE) 120 MG tablet TAKE ONE TABLET BY MOUTH TWICE DAILY (Patient taking differently: TAKE 120MG  BY MOUTH TWICE DAILY) 180 tablet 3  . torsemide (DEMADEX) 100 MG tablet Take 0.5 tablets (50 mg total) by mouth daily. 15 tablet 0   No current facility-administered medications for this encounter.     Allergies  Allergen Reactions  . Penicillins Other (See Comments)    Unknown reaction      Social History   Social History  . Marital status: Married    Spouse name: N/A  . Number of children: 4  . Years of education: 14   Occupational History  . brick layer - currently unemployed    Social History Main Topics  . Smoking status: Former Smoker    Packs/day: 1.00    Years: 29.00    Types: Cigarettes    Quit date: 03/02/2000  . Smokeless tobacco: Never Used  . Alcohol use No  . Drug use: No  . Sexual activity: Not on file   Other Topics Concern  . Not on file   Social History Narrative   Pt lives with wife in a one story home - married for 89 years. He hunts regularly and is active, walking > 1 mile at times without difficulty.  Has 4 children.     Retired Horticulturist, commercial.  Education: some college.      Family History  Problem Relation Age of Onset  . Heart failure Father        Deceased  . Alzheimer's disease Mother        Living  . Heart attack Mother   . CAD Brother   . CAD Brother   . CAD  Brother   . CAD Brother   . Healthy Son   . Healthy Daughter   . Diabetes Brother     Vitals:   12/04/16 0948  BP: 94/64  Pulse: (!) 59  SpO2: 96%  Weight: 223 lb 12.8 oz (101.5 kg)     PHYSICAL EXAM: General:  Well appearing. No respiratory difficulty HEENT: normal Neck: supple. 6-7  cm JVD. Carotids 2+ bilat; no bruits. No lymphadenopathy or thyromegaly appreciated. Cor: PMI nondisplaced. Regular rate & rhythm. No rubs, gallops or murmurs. Lungs: clear bilaterally.  Abdomen: soft, nontender, nondistended. No hepatosplenomegaly. No bruits or masses. Good bowel sounds. Extremities: no cyanosis, clubbing, rash. 1+ pedal edema bilaterally.  Neuro: alert & oriented x 3, cranial nerves grossly intact. moves all 4 extremities w/o difficulty. Affect pleasant.    ASSESSMENT & PLAN:  1. Chronic systolic CHF: EF 39-03% Biventricular Failure with RV dysfunction by echo this admit. S/p Medtronic BiV ICD in place. Combined ICM and NICM.  - NYHA II - Volume stable on exam.  - Continue torsemide 50 mg daily. - Continue losartan 12.5 mg daily.  - BMET today - No beta blocker with bradycardia, hypotension and recent decompensation - No dig with bradycardia.   2. [name] - No signs or symptoms of ischemia - Continue ASA and statis.  - LHC 7/06: EF 35-40%, mLAD 99%, dLAD 90%, mid to dist CFX 70%, RCA occluded. PCI: DES to mid and dist LAD. Carotids 4/12: 0-39% bilat ICA - Had unremarkable Stress test in 2013. Prior apical and inferior infarct.   3. HTN - BP soft.   4. [name] - Follows with PCP  5. CKD III - BMET today  6. [name] - BMET today - s/p ICD - Continue sotalol.   7. PAF s/p RFCA 04/2012 - Noted to be back in Afib for ~ 1 month prior to admission. Maintaining NSR.   - Continue Eliquis.  - NSR by device interrogation  BMET today, follow up in 6 weeks. Consider CPX testing.    Arbutus Leas, NP 12/04/16   Greater than 50% of the (total minutes  30) visit spent in counseling/coordination of care regarding medication education, dietary compliance, use of sliding scale diuretics.

## 2016-12-04 NOTE — Patient Instructions (Addendum)
Start Lyrica   AM  PM Week 1: 50mg   50mg  Week 2: 75mg   75mg   Call and let me know if your pain is better controlled on 75mg  twice daily.  If not, we will send a prescription for 100mg  twice daily  Return to clinic in 3 months

## 2016-12-04 NOTE — Telephone Encounter (Signed)
I called [name] [name] today to try scheduling an appointment. He did not answer so I left a voicemail stating who I am and my business and requested he call me back.

## 2016-12-04 NOTE — Patient Instructions (Signed)
Routine lab work today. Will notify you of abnormal results, otherwise no news is good news!  Follow up 6 weeks with Dr. [name].  ______________________________________________________________ [name] [name] Code: 8315   Take all medication as prescribed the day of your appointment. Bring all medications with you to your appointment.  Do the following things EVERYDAY: 1) Weigh yourself in the morning before breakfast. Write it down and keep it in a log. 2) Take your medicines as prescribed 3) Eat low salt foods-Limit salt (sodium) to 2000 mg per day.  4) Stay as active as you can everyday 5) Limit all fluids for the day to less than 2 liters

## 2016-12-15 ENCOUNTER — Telehealth: Payer: Self-pay | Admitting: Family

## 2016-12-15 NOTE — Telephone Encounter (Signed)
pls advise on msg below.../lmb 

## 2016-12-15 NOTE — Telephone Encounter (Signed)
Do not see that anyone here has ordered this. Did hospital order? He did not follow up here as advised after the hospital and would need visit if someone at this office needs to supervise. Otherwise referring MD can supervise.

## 2016-12-15 NOTE — Telephone Encounter (Signed)
Called asking who will be attending to the patient pallative care, patient has appt set up with Ashleigh on 10/26 Please advise

## 2016-12-21 ENCOUNTER — Telehealth: Payer: Self-pay | Admitting: Cardiology

## 2016-12-21 ENCOUNTER — Ambulatory Visit (INDEPENDENT_AMBULATORY_CARE_PROVIDER_SITE_OTHER): Payer: Self-pay

## 2016-12-21 DIAGNOSIS — Z95810 Presence of automatic (implantable) cardiac defibrillator: Secondary | ICD-10-CM

## 2016-12-21 DIAGNOSIS — I5022 Chronic systolic (congestive) heart failure: Secondary | ICD-10-CM

## 2016-12-21 NOTE — Telephone Encounter (Signed)
LMOVM reminding pt to send remote transmission.   

## 2016-12-22 ENCOUNTER — Telehealth (HOSPITAL_COMMUNITY): Payer: Self-pay

## 2016-12-22 NOTE — Telephone Encounter (Signed)
I called [name] [name] today to confirm our appointment for 10:30 this morning. He stated he was not home and would not be there until tonight because he was working. He said he would call me back when they finished.

## 2016-12-22 NOTE — Progress Notes (Signed)
EPIC Encounter for ICM Monitoring  Patient Name: [name] [name] is a 66 y.o. male Date: 12/22/2016 Primary Care Physican: Golden Circle, FNP Primary Cardiologist:[facility]/HF clinic Electrophysiologist: [name] Dry Weight:216-220 lbs Bi-V Pacing: 98.3%       Heart Failure questions reviewed, pt asymptomatic.  Hospitalized 11/18/2016 to 11/27/2016 for CHF   Thoracic impedance normal since hospital discharge.  Prescribed dosage: Torsemide 100 mg take 0.5 tablet (50 mg total) once a day.  Labs: 12/04/2016 Creatinine 1.32, BUN 33, Potassium 4.1, Sodium 136, EGFR 55->60 11/27/2016 Creatinine 1.36, BUN 45, Potassium 3.6, Sodium 133, EGFR 53->60 11/26/2016 Creatinine 1.38, BUN 39, Potassium 4.3, Sodium 133, EGFR 52-60  11/25/2016 Creatinine 1.26, BUN 38, Potassium 3.0, Sodium 135, EGFR 58->60  11/24/2016 Creatinine 1.33, BUN 39, Potassium 3.2, Sodium 133, EGFR 54->60  11/23/2016 Creatinine 1.38, BUN 36, Potassium 2.8, Sodium 138, EGFR 52-60  11/22/2016 Creatinine 1.48, BUN 36, Potassium 3.5, Sodium 134, EGFR 48-55  11/21/2016 Creatinine 1.37, BUN 33, Potassium 3.4, Sodium 136, EGFR 52->60  11/20/2016 Creatinine 1.44, BUN 31, Potassium 4.3, Sodium 140, EGFR 49-57  11/19/2016 Creatinine 1.31, BUN 129, Potassium 4.4, Sodium 137, EGFR 55->60  11/18/2016 Creatinine 1.38, BUN 31, Potassium 3.7, Sodium 136, EGFR 52-60  11/12/2016 Creatinine 1.49, BUN 30, Potassium 4.2, Sodium 138 06/02/2016 Creatinine 1.16, BUN 18, Potassium 4.7, Sodium 137 04/06/2016 Creatinine 0.92, BUN 19, Potassium 4.2, Sodium 138 09/25/2017Creatinine 1.31, BUN 19, Potassium 5.0, Sodium 134  Recommendations: No changes.  Encouraged to call for fluid symptoms.  Follow-up plan: ICM clinic phone appointment on 01/25/2017.  Office appointment scheduled 01/15/2017 with HF clinic NP/PA.  Copy of ICM check sent to Dr. [name].   3 month ICM trend: 12/22/2016    AT/AF  1 Year ICM trend:      [name] Billings,  RN 12/22/2016 8:52 AM

## 2016-12-25 ENCOUNTER — Encounter: Payer: Self-pay | Admitting: Nurse Practitioner

## 2016-12-25 ENCOUNTER — Ambulatory Visit (INDEPENDENT_AMBULATORY_CARE_PROVIDER_SITE_OTHER): Payer: PPO | Admitting: Nurse Practitioner

## 2016-12-25 ENCOUNTER — Ambulatory Visit: Payer: PPO | Admitting: Family

## 2016-12-25 VITALS — BP 96/66 | HR 63 | Temp 98.2°F | Ht 71.0 in | Wt 224.8 lb

## 2016-12-25 DIAGNOSIS — I10 Essential (primary) hypertension: Secondary | ICD-10-CM

## 2016-12-25 DIAGNOSIS — E1121 Type 2 diabetes mellitus with diabetic nephropathy: Secondary | ICD-10-CM | POA: Diagnosis not present

## 2016-12-25 DIAGNOSIS — I509 Heart failure, unspecified: Secondary | ICD-10-CM

## 2016-12-25 LAB — POCT GLYCOSYLATED HEMOGLOBIN (HGB A1C): HEMOGLOBIN A1C: 8

## 2016-12-25 NOTE — Assessment & Plan Note (Signed)
Maintained on sotalol, losartan. Denies headaches, vision changes, chest pain, shortness of breath. Monitors his blood pressure at home 2-3 times per week, readings 90s /60s. Will maintain current regimen.

## 2016-12-25 NOTE — Assessment & Plan Note (Signed)
In office A1c 8.0. Patient maintained on metformin, NPH 70-30 BID. He took himself off trulicity about one month ago. We will maintain his current regimen as he has complex co morbidities with a1c goal 7.5-8. Fasting and bedtime glucose parameters given, he will continue to monitor his CBGs at home and work on diet and exercise. He'll follow up in 3 months for recheck. He will consider pneumonia vaccine at that time. Foot exam done today

## 2016-12-25 NOTE — Patient Instructions (Addendum)
Your A1c today is 8. I am okay with keeping your current regimen as long as we can keep your A1c at 8 or under. This means fasting blood sugars should not be over 150, bedtime sugar should not be over 180.  Id like to see you back in 3 months so we can follow up on your a1c.   It was nice to meet you. Thanks for letting me take care of you today :)

## 2016-12-25 NOTE — Assessment & Plan Note (Signed)
Heart failure stable. No weight gain since hospital, weighs daily. He is followed by CHF clinic. Maintain current regimen, toresemide, losartan.

## 2016-12-25 NOTE — Progress Notes (Addendum)
Subjective:    Patient ID: [name] [name], male    DOB: 06-26-1950, 66 y.o.   MRN: [id number]  HPI [name] [name] is a 66 yo male who presents today to establish care.He  Is transferring to me from another provider in the same clinic. He was recently hospitalized for CHF exacerbation.  Heart failure- Maintained on torsemide, losartan. Recent hospitalization. Follows by CHF clinic for management. Reports compliance with medications. He has not gained any weight since hospitalization, he weighs daily. He denies any weakness, chest pain, shortness of breath. He notices some lower extremity edema at the end of the day, especially after standing all day. Hospitalization notes reviewed.  Hypertension- Maintained on sotalol, losartan. Reports compliance with medications.  Checks about twice a week. He reports consistent readings around 90s/ 60s. Denies lightheadedness or dizziness, headaches, vision changes.   BP Readings from Last 3 Encounters:  12/25/16 96/66  12/04/16 94/64  12/04/16 124/68   Diabetes- Maintained on metformin, insulin NPH-regular 70-30 BID. He checks his blood sugar on some mornings. Readings usually around 120s. He stopped taking his trulicity after recent hospitalization and hopes he can stay off of it. He denies any episodes of hypoglycemia-shakiness, sweating. He denies polyuria, polyphagia, polydipsia, diarrhea.   Review of Systems  See HPI  Past Medical History:  Diagnosis Date  . AICD (automatic cardioverter/defibrillator) present 02/05/2014   Upgrade to Medtronic biventricular ICD, serial number  [id number]   . Atrial flutter ([place]) 04/2012   s/p TEE-EPS+RFCA 04/2012  . CAD (coronary artery disease) 1191,4782 X 2    RCA-T, 70% PL (off CFX), 99% Prox LAD/90% Dist LAD, S/P TAXUS stent x 2  . CHF (congestive heart failure) ([place])   . Chronic anticoagulation   . Chronic systolic heart failure ([place])   . CKD (chronic kidney disease)   . Diabetic retinopathy ([place])   . DM  type 2 (diabetes mellitus, type 2) (H[facility])    insulin dependent  . HTN (hypertension)   . Hypercholesteremia    ablation  . ICD (implantable cardiac defibrillator) in place   . Ischemic cardiomyopathy March 2015   20-25% 2D   . Nephrolithiasis   . Ventricular tachycardia [facility])      Social History   Social History  . Marital status: Married    Spouse name: N/A  . Number of children: 4  . Years of education: 14   Occupational History  . brick layer - currently unemployed    Social History Main Topics  . Smoking status: Former Smoker    Packs/day: 1.00    Years: 29.00    Types: Cigarettes    Quit date: 03/02/2000  . Smokeless tobacco: Never Used  . Alcohol use No  . Drug use: No  . Sexual activity: Not on file   Other Topics Concern  . Not on file   Social History Narrative   Pt lives with wife in a one story home - married for 33 years. He hunts regularly and is active, walking > 1 mile at times without difficulty.  Has 4 children.     Retired Horticulturist, commercial.  Education: some college.    Past Surgical History:  Procedure Laterality Date  . ATRIAL FLUTTER ABLATION N/A 05/19/2012   Procedure: ATRIAL FLUTTER ABLATION;  Surgeon: Thompson Grayer, MD;  Location: [facility] CATH LAB;  Service: Cardiovascular;  Laterality: N/A;  . BI-VENTRICULAR IMPLANTABLE CARDIOVERTER DEFIBRILLATOR UPGRADE N/A 02/05/2014   Procedure: BI-VENTRICULAR IMPLANTABLE CARDIOVERTER DEFIBRILLATOR UPGRADE;  Surgeon: Evans Lance,  MD;  Location: [facility] CATH LAB;  Service: Cardiovascular;  Laterality: N/A;  . BIV ICD GENERTAOR CHANGE OUT  02/05/2014   Upgrade to Medtronic biventricular ICD, serial number  [id number] by Dr. [name]  . CARDIAC DEFIBRILLATOR PLACEMENT  2007    Medtronic Maximo VR, serial number [id number]  . PERCUTANEOUS CORONARY STENT INTERVENTION (PCI-S)  January 2002   PTCA/Stent Distal RCA  . PERCUTANEOUS CORONARY STENT INTERVENTION (PCI-S)  June 2002   PTCA/Stent x 3 RCA, thrombolysis - failed  .  PERCUTANEOUS CORONARY STENT INTERVENTION (PCI-S)  July 2006   TAXUS stents to prox and distal LAD    Family History  Problem Relation Age of Onset  . Heart failure Father        Deceased  . Alzheimer's disease Mother        Living  . Heart attack Mother   . CAD Brother   . CAD Brother   . CAD Brother   . CAD Brother   . Healthy Son   . Healthy Daughter   . Diabetes Brother     Allergies  Allergen Reactions  . Penicillins Other (See Comments)    Unknown reaction    Current Outpatient Prescriptions on File Prior to Visit  Medication Sig Dispense Refill  . atorvastatin (LIPITOR) 80 MG tablet TAKE ONE TABLET BY MOUTH ONCE DAILY 90 tablet 3  . Dulaglutide (TRULICITY) 0.93 AT/5.5DD SOPN Inject 0.75 mg into the skin once a week. 4 pen 2  . insulin NPH-regular Human (NOVOLIN 70/30 RELION) (70-30) 100 UNIT/ML injection Inject 30 Units into the skin 2 (two) times daily with a meal. 10 mL 12  . losartan (COZAAR) 25 MG tablet Take 0.5 tablets (12.5 mg total) by mouth daily. 15 tablet 0  . magnesium oxide (MAG-OX) 400 MG tablet Take 1 tablet (400 mg total) by mouth daily. 90 tablet 3  . metFORMIN (GLUCOPHAGE-XR) 750 MG 24 hr tablet TAKE TWO TABLETS BY MOUTH ONCE DAILY WITH  BREAKFAST (Patient taking differently: TAKE 750MG  BY MOUTH TWICE DAILY) 180 tablet 1  . Multiple Vitamins-Minerals (MULTIVITAMIN PO) Take 1 capsule by mouth daily.     . potassium chloride SA (K-DUR,KLOR-CON) 20 MEQ tablet Take 1 tablet (20 mEq total) by mouth daily. 30 tablet 0  . pregabalin (LYRICA) 75 MG capsule Take 1 capsule (75 mg total) by mouth 2 (two) times daily. 28 capsule 0  . sildenafil (REVATIO) 20 MG tablet Take 1-5 tablets (20-100 mg total) by mouth daily as needed. 50 tablet 0  . sotalol (BETAPACE) 120 MG tablet TAKE ONE TABLET BY MOUTH TWICE DAILY (Patient taking differently: TAKE 120MG  BY MOUTH TWICE DAILY) 180 tablet 3  . torsemide (DEMADEX) 100 MG tablet Take 0.5 tablets (50 mg total) by mouth daily.  15 tablet 0  . apixaban (ELIQUIS) 5 MG TABS tablet Take 1 tablet (5 mg total) by mouth 2 (two) times daily. (Patient not taking: Reported on 12/25/2016) 60 tablet 6   No current facility-administered medications on file prior to visit.     BP 96/66 (BP Location: Left Arm, Patient Position: Sitting, Cuff Size: Large)   Pulse 63   Temp 98.2 F (36.8 C) (Oral)   Ht 5\' 11"  (1.803 m)   Wt 224 lb 12 oz (101.9 kg)   SpO2 95%   BMI 31.35 kg/m        Objective:   Physical Exam  Constitutional: He is oriented to person, place, and time. He appears well-developed and well-nourished. No distress.  Neck: JVD present.  Cardiovascular: Normal rate, regular rhythm and intact distal pulses.   No murmur heard. Pulmonary/Chest: Effort normal and breath sounds normal. No respiratory distress.  Abdominal: Soft. Bowel sounds are normal. He exhibits no distension. There is no tenderness.  Neurological: He is alert and oriented to person, place, and time. Coordination normal.  Skin: Skin is warm and dry.  Psychiatric: He has a normal mood and affect. Judgment and thought content normal.       Assessment & Plan:  A1c 8. restrat trulicity and return in 3 month

## 2017-01-05 ENCOUNTER — Other Ambulatory Visit: Payer: Self-pay | Admitting: *Deleted

## 2017-01-05 MED ORDER — PREGABALIN 100 MG PO CAPS: 100.0000 mg | ORAL_CAPSULE | Freq: Two times a day (BID) | ORAL | 5 refills | Status: DC

## 2017-01-07 ENCOUNTER — Telehealth (HOSPITAL_COMMUNITY): Payer: Self-pay

## 2017-01-07 ENCOUNTER — Other Ambulatory Visit: Payer: Self-pay | Admitting: *Deleted

## 2017-01-07 DIAGNOSIS — E0842 Diabetes mellitus due to underlying condition with diabetic polyneuropathy: Secondary | ICD-10-CM

## 2017-01-07 MED ORDER — METFORMIN HCL ER 750 MG PO TB24: ORAL_TABLET | ORAL | 0 refills | Status: DC

## 2017-01-07 NOTE — Telephone Encounter (Signed)
I called [name] [name] today to set up a meeting. He did not answer so I left a voicemail stating my information and my purpose for calling. He still has not been seen by paramedicine and does not return phone calls. My recommendation will be to remove him from the program unless he reaches out by the first of next week.

## 2017-01-07 NOTE — Telephone Encounter (Signed)
[name] [name] was called today to schedule a visit. He did not answer so I left a voicemail requesting he call me back to schedule a visit.

## 2017-01-13 ENCOUNTER — Telehealth (HOSPITAL_COMMUNITY): Payer: Self-pay | Admitting: Surgery

## 2017-01-13 ENCOUNTER — Encounter (HOSPITAL_COMMUNITY): Payer: Self-pay

## 2017-01-13 NOTE — Telephone Encounter (Signed)
I called to check on [name] [name] to see how he is doing since his hospitalization.  I left a message to encourage him to call me back with any concerns or questions related to his HF.  I also questioned him about his HF [employer] involvement as he has been unavailable for home visits according to the community paramedic.  I will plan to speak to him during his appointment in the clinic on Friday.

## 2017-01-14 ENCOUNTER — Telehealth (HOSPITAL_COMMUNITY): Payer: Self-pay

## 2017-01-14 NOTE — Telephone Encounter (Signed)
I called [name] [name] to schedule an appointment. He stated he was not available today and had been busy with work which is why he had not been answering or returning my phone calls. He has an appointment on Friday morning at the clinic and I will talk to him there to see if he wants to participate in the program.

## 2017-01-14 NOTE — Progress Notes (Signed)
Opened in error

## 2017-01-15 ENCOUNTER — Other Ambulatory Visit (HOSPITAL_COMMUNITY): Payer: Self-pay

## 2017-01-15 ENCOUNTER — Ambulatory Visit (HOSPITAL_COMMUNITY)
Admission: RE | Admit: 2017-01-15 | Discharge: 2017-01-15 | Disposition: A | Discharge status: Home / Self Care | Payer: PPO | Source: Ambulatory Visit | Attending: Cardiology | Admitting: Cardiology

## 2017-01-15 ENCOUNTER — Encounter (HOSPITAL_COMMUNITY): Payer: Self-pay

## 2017-01-15 VITALS — BP 122/80 | HR 68 | Wt 221.0 lb

## 2017-01-15 DIAGNOSIS — Z87891 Personal history of nicotine dependence: Secondary | ICD-10-CM | POA: Insufficient documentation

## 2017-01-15 DIAGNOSIS — Z880 Allergy status to penicillin: Secondary | ICD-10-CM | POA: Insufficient documentation

## 2017-01-15 DIAGNOSIS — E7800 Pure hypercholesterolemia, unspecified: Secondary | ICD-10-CM | POA: Diagnosis not present

## 2017-01-15 DIAGNOSIS — I5022 Chronic systolic (congestive) heart failure: Secondary | ICD-10-CM | POA: Diagnosis not present

## 2017-01-15 DIAGNOSIS — Z7901 Long term (current) use of anticoagulants: Secondary | ICD-10-CM | POA: Diagnosis not present

## 2017-01-15 DIAGNOSIS — R0602 Shortness of breath: Secondary | ICD-10-CM | POA: Insufficient documentation

## 2017-01-15 DIAGNOSIS — I130 Hypertensive heart and chronic kidney disease with heart failure and stage 1 through stage 4 chronic kidney disease, or unspecified chronic kidney disease: Secondary | ICD-10-CM | POA: Insufficient documentation

## 2017-01-15 DIAGNOSIS — E1122 Type 2 diabetes mellitus with diabetic chronic kidney disease: Secondary | ICD-10-CM | POA: Insufficient documentation

## 2017-01-15 DIAGNOSIS — Z833 Family history of diabetes mellitus: Secondary | ICD-10-CM | POA: Insufficient documentation

## 2017-01-15 DIAGNOSIS — I4892 Unspecified atrial flutter: Secondary | ICD-10-CM | POA: Insufficient documentation

## 2017-01-15 DIAGNOSIS — I2510 Atherosclerotic heart disease of native coronary artery without angina pectoris: Secondary | ICD-10-CM | POA: Diagnosis not present

## 2017-01-15 DIAGNOSIS — I480 Paroxysmal atrial fibrillation: Secondary | ICD-10-CM | POA: Diagnosis not present

## 2017-01-15 DIAGNOSIS — Z8249 Family history of ischemic heart disease and other diseases of the circulatory system: Secondary | ICD-10-CM | POA: Diagnosis not present

## 2017-01-15 DIAGNOSIS — N183 Chronic kidney disease, stage 3 unspecified: Secondary | ICD-10-CM

## 2017-01-15 DIAGNOSIS — Z9889 Other specified postprocedural states: Secondary | ICD-10-CM | POA: Insufficient documentation

## 2017-01-15 DIAGNOSIS — I5082 Biventricular heart failure: Secondary | ICD-10-CM | POA: Diagnosis not present

## 2017-01-15 DIAGNOSIS — Z79899 Other long term (current) drug therapy: Secondary | ICD-10-CM

## 2017-01-15 DIAGNOSIS — Z794 Long term (current) use of insulin: Secondary | ICD-10-CM | POA: Diagnosis not present

## 2017-01-15 DIAGNOSIS — I10 Essential (primary) hypertension: Secondary | ICD-10-CM

## 2017-01-15 DIAGNOSIS — Z95810 Presence of automatic (implantable) cardiac defibrillator: Secondary | ICD-10-CM

## 2017-01-15 DIAGNOSIS — I255 Ischemic cardiomyopathy: Secondary | ICD-10-CM

## 2017-01-15 LAB — BASIC METABOLIC PANEL
Anion gap: 7 (ref 5–15)
BUN: 25 mg/dL — ABNORMAL HIGH (ref 6–20)
CALCIUM: 9.3 mg/dL (ref 8.9–10.3)
CO2: 27 mmol/L (ref 22–32)
CREATININE: 1.13 mg/dL (ref 0.61–1.24)
Chloride: 97 mmol/L — ABNORMAL LOW (ref 101–111)
Glucose, Bld: 387 mg/dL — ABNORMAL HIGH (ref 65–99)
Potassium: 4.7 mmol/L (ref 3.5–5.1)
SODIUM: 131 mmol/L — AB (ref 135–145)

## 2017-01-15 LAB — BRAIN NATRIURETIC PEPTIDE: B Natriuretic Peptide: 159.6 pg/mL — ABNORMAL HIGH (ref 0.0–100.0)

## 2017-01-15 MED ORDER — LOSARTAN POTASSIUM 25 MG PO TABS: 25.0000 mg | ORAL_TABLET | Freq: Every day | ORAL | 6 refills | Status: DC

## 2017-01-15 MED ORDER — TORSEMIDE 100 MG PO TABS: 50.0000 mg | ORAL_TABLET | Freq: Every day | ORAL | 6 refills | Status: DC

## 2017-01-15 NOTE — Progress Notes (Signed)
[name] [name] was seen at the HF clinic today and stated he is feeling well. [name] [name] reports that his fluid level is very good and believes this is a result of being converted from a-fib. He has been taking lasix 80 mg daily for two weeks because he ran out of torsemide. His vest reading was 38%. [name] [name] is increasing his losartan and putting him back on torsemide.   He reports that he has gone back to work but will be able to set a meeting on Thursdays at 16:00 beginning on Nov 29.

## 2017-01-15 NOTE — Patient Instructions (Signed)
STOP lasix.  START Torsemide 50 mg (1/2 tablet) once every morning.  INCREASE Losartan to 25 mg (1 whole tablet) once daily at bedtime.  Routine lab work today. Will notify you of abnormal results, otherwise no news is good news!  Return in 10 days to repeat lab work.  Will schedule you for Cardiopulmonary Exercise Test. This test is done at our [facility]. Please wear comfortable clothes and shoes for this test. Avoid heavy meal before the test (light snack/meal recommended). Avoid caffeine, alcohol, tobacco products 12 hrs before test. Please give 24 hr notice for cancellations/rescheduling: [phone].  Follow up 2 months with Dr. [name].  Take all medication as prescribed the day of your appointment. Bring all medications with you to your appointment.  Do the following things EVERYDAY: 1) Weigh yourself in the morning before breakfast. Write it down and keep it in a log. 2) Take your medicines as prescribed 3) Eat low salt foods-Limit salt (sodium) to 2000 mg per day.  4) Stay as active as you can everyday 5) Limit all fluids for the day to less than 2 liters

## 2017-01-15 NOTE — Progress Notes (Signed)
Advanced Heart Failure Clinic Note     Primary Cardiologist: Dr. [name] Primary HF: Dr. [name]   HPI: [name] [name] is a 66 y.o. 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   Pt [facility] 11[facility]18[facility]16 with worsening SOB and 24 lb weight gain despite his lasix being increased by PCP. Echo 11/19/16 with LVEF 15-20%, Mod MR, Mod LAE, Mild RV dilation and moderately reduced function, Moderate TR, PA peak pressure 44 mm Hg. Device interrogation showed recent bout of AF but was back in NSR. Suspect AF contributing to decompensation. Work up planned for possible transplant, RV function probably will not support LVAD. Discharge weight was 225 pounds on 11/27/16.   Pt presents today for regular follow up. Feeling good overall. He has been out of torsemide x 2 weeks, and instead has been taking lasix 80 mg daily. Weight has been stable. Denies SOB on flat ground or with ADLs.  Still drinking > 2L daily and eating ice. Watching his salt intake. Otherwise taking all medication as directed. Denies lightheadedness or dizziness.   Optivol: Personally interrogated in clinic, Thoracic impedence above threshold, no fluid index. Pt activity gradually increasing to ~ 1 hrs daily. No VT/VF. No AF since admission.   Review of systems complete and found to be negative unless listed in HPI.    Past Medical History:  Diagnosis Date  . AICD (automatic cardioverter/defibrillator) present 02/05/2014   Upgrade to Medtronic biventricular ICD, serial number  BLD [id number]   . Atrial flutter ([place]) 04/2012   s/p TEE-EPS+RFCA 04/2012  . CAD (coronary artery disease) 6599,3570 X 2    RCA-T, 70% PL (off CFX), 99% Prox LAD/90% Dist LAD, S/P TAXUS stent x 2  . CHF (congestive heart failure) ([place])   . Chronic anticoagulation   . Chronic systolic heart failure ([place])   . CKD (chronic  kidney disease)   . Diabetic retinopathy ([place])   . DM type 2 (diabetes mellitus, type 2) (HCC)    insulin dependent  . HTN (hypertension)   . Hypercholesteremia    ablation  . ICD (implantable cardiac defibrillator) in place   . Ischemic cardiomyopathy March 2015   20-25% 2D   . Nephrolithiasis   . Ventricular tachycardia ([place])     Current Outpatient Medications  Medication Sig Dispense Refill  . apixaban (ELIQUIS) 5 MG TABS tablet Take 1 tablet (5 mg total) by mouth 2 (two) times daily. 60 tablet 6  . atorvastatin (LIPITOR) 80 MG tablet TAKE ONE TABLET BY MOUTH ONCE DAILY 90 tablet 3  . Dulaglutide (TRULICITY) 1.77 LT/9.0ZE SOPN Inject 0.75 mg into the skin once a week. 4 pen 2  . furosemide (LASIX) 80 MG tablet Take 80 mg daily by mouth.    . insulin NPH-regular Human (NOVOLIN 70/30 RELION) (70-30) 100 UNIT/ML injection Inject 30 Units into the skin 2 (two) times daily with a meal. 10 mL 12  . losartan (COZAAR) 25 MG tablet Take 0.5 tablets (12.5 mg total) by mouth daily. 15 tablet 0  . magnesium oxide (MAG-OX) 400 MG tablet Take 1 tablet (400 mg total) by mouth daily. 90 tablet 3  . metFORMIN (GLUCOPHAGE-XR) 750 MG 24 hr tablet TAKE TWO TABLETS BY MOUTH ONCE DAILY WITH BREAKFAST 180 tablet 0  . Multiple Vitamins-Minerals (MULTIVITAMIN PO) Take 1 capsule by mouth daily.     Marland Kitchen  potassium chloride SA (K-DUR,KLOR-CON) 20 MEQ tablet Take 1 tablet (20 mEq total) by mouth daily. 30 tablet 0  . pregabalin (LYRICA) 100 MG capsule Take 1 capsule (100 mg total) 2 (two) times daily by mouth. 60 capsule 5  . sildenafil (REVATIO) 20 MG tablet Take 1-5 tablets (20-100 mg total) by mouth daily as needed. 50 tablet 0  . sotalol (BETAPACE) 120 MG tablet TAKE ONE TABLET BY MOUTH TWICE DAILY (Patient taking differently: TAKE 120MG  BY MOUTH TWICE DAILY) 180 tablet 3   No current facility-administered medications for this encounter.     Allergies  Allergen Reactions  . Penicillins Other (See Comments)     Unknown reaction      Social History   Socioeconomic History  . Marital status: Married    Spouse name: Not on file  . Number of children: 4  . Years of education: 81  . Highest education level: Not on file  Social Needs  . Financial resource strain: Not on file  . Food insecurity - worry: Not on file  . Food insecurity - inability: Not on file  . Transportation needs - medical: Not on file  . Transportation needs - non-medical: Not on file  Occupational History  . Occupation: Psychiatrist - currently unemployed  Tobacco Use  . Smoking status: Former Smoker    Packs/day: 1.00    Years: 29.00    Pack years: 29.00    Types: Cigarettes    Last attempt to quit: 03/02/2000    Years since quitting: 16.8  . Smokeless tobacco: Never Used  Substance and Sexual Activity  . Alcohol use: No    Alcohol/week: 0.0 oz  . Drug use: No  . Sexual activity: Not on file  Other Topics Concern  . Not on file  Social History Narrative   Pt lives with wife in a one story home - married for 38 years. He hunts regularly and is active, walking > 1 mile at times without difficulty.  Has 4 children.     Retired Horticulturist, commercial.  Education: some college.   Family History  Problem Relation Age of Onset  . Heart failure Father        Deceased  . Alzheimer's disease Mother        Living  . Heart attack Mother   . CAD Brother   . CAD Brother   . CAD Brother   . CAD Brother   . Healthy Son   . Healthy Daughter   . Diabetes Brother    Vitals:   01/15/17 0825  BP: 122/80  Pulse: 68  SpO2: 98%  Weight: 221 lb (100.2 kg)   Wt Readings from Last 3 Encounters:  01/15/17 221 lb (100.2 kg)  12/25/16 224 lb 12 oz (101.9 kg)  12/04/16 223 lb 12.8 oz (101.5 kg)    PHYSICAL EXAM: General: Elderly appearing. No resp difficulty. HEENT: Normal Neck: Supple. JVP 7-8 cm. Carotids 2+ bilat; no bruits. No thyromegaly or nodule noted. Cor: PMI nondisplaced. RRR, No M/G/R noted Lungs: CTAB, normal  effort. Abdomen: Soft, non-tender, non-distended, no HSM. No bruits or masses. +BS  Extremities: No cyanosis, clubbing, or rash. Trace ankle edema bilaterally.  Neuro: Alert & orientedx3, cranial nerves grossly intact. moves all 4 extremities w/o difficulty. Affect pleasant   ASSESSMENT & PLAN:  1. Chronic systolic CHF: - Echo 1/32/44 EF 15-20% Biventricular Failure with RV dysfunction by echo this admit. S/p Medtronic BiV ICD in place. Combined ICM and NICM.  -  NYHA II - Volume stable on exam and optivol, but ReDs vest 38%.  - Stop lasix. Restart torsemide 50 mg daily.  - Increase losartan to 25 mg qhs. BMET today and 2 weeks.  - No beta blocker with bradycardia, hypotension and recent decompensation - No dig with bradycardia.  - Reinforced fluid restriction to < 2 L daily, sodium restriction to less than 2000 mg daily, and the importance of daily weights.    2. [name] - No s/s of ischemia.    - Continue ASA and statis.  - LHC 7/06: EF 35-40%, mLAD 99%, dLAD 90%, mid to dist CFX 70%, RCA occluded. PCI: DES to mid and dist LAD. Carotids 4/12: 0-39% bilat ICA - Had unremarkable Stress test in 2013. Prior apical and inferior infarct.   3. HTN - Meds as above.   4. [name] - Per PCP.   5. CKD III - BMET today and 2 weeks with med changes.   6. [name] - BMET today.  - s/p ICD - Continue sotalol.   7. PAF s/p RFCA 04/2012 - Noted to be back in Afib for ~ 1 month prior to admission. Maintaining NSR.    - Continue Eliquis.  - NSR by device interrogation  BMET today and 2 weeks. Meds as above. Order CPX testing. RTC 2 months with MD.  Continue paramedicine.   Shirley Friar, PA-C 01/15/17   Greater than 50% of the 25 minute visit was spent in counseling/coordination of care regarding disease state education, salt/fluid restriction, sliding scale diuretics, and medication compliance.

## 2017-01-25 ENCOUNTER — Encounter: Payer: PPO | Admitting: *Deleted

## 2017-01-25 ENCOUNTER — Telehealth: Payer: Self-pay | Admitting: Cardiology

## 2017-01-25 NOTE — Telephone Encounter (Signed)
LMOVM reminding pt to send remote transmission.   

## 2017-01-27 ENCOUNTER — Ambulatory Visit (HOSPITAL_COMMUNITY)
Admission: RE | Admit: 2017-01-27 | Discharge: 2017-01-27 | Disposition: A | Discharge status: Home / Self Care | Payer: PPO | Source: Ambulatory Visit | Attending: Internal Medicine | Admitting: Internal Medicine

## 2017-01-27 DIAGNOSIS — I5022 Chronic systolic (congestive) heart failure: Secondary | ICD-10-CM | POA: Diagnosis not present

## 2017-01-27 LAB — BASIC METABOLIC PANEL
Anion gap: 8 (ref 5–15)
BUN: 44 mg/dL — ABNORMAL HIGH (ref 6–20)
CALCIUM: 9.8 mg/dL (ref 8.9–10.3)
CO2: 29 mmol/L (ref 22–32)
CREATININE: 1.47 mg/dL — AB (ref 0.61–1.24)
Chloride: 95 mmol/L — ABNORMAL LOW (ref 101–111)
GFR, EST AFRICAN AMERICAN: 56 mL/min — AB (ref >60)
GFR, EST NON AFRICAN AMERICAN: 48 mL/min — AB (ref >60)
Glucose, Bld: 399 mg/dL — ABNORMAL HIGH (ref 65–99)
Potassium: 4.7 mmol/L (ref 3.5–5.1)
SODIUM: 132 mmol/L — AB (ref 135–145)

## 2017-01-29 ENCOUNTER — Encounter: Payer: Self-pay | Admitting: Neurology

## 2017-01-29 ENCOUNTER — Ambulatory Visit: Payer: PPO | Admitting: Neurology

## 2017-01-29 ENCOUNTER — Encounter (HOSPITAL_COMMUNITY): Payer: PPO

## 2017-01-29 ENCOUNTER — Other Ambulatory Visit (HOSPITAL_COMMUNITY): Payer: Self-pay

## 2017-01-29 VITALS — BP 100/60 | HR 58 | Ht 71.0 in | Wt 221.5 lb

## 2017-01-29 DIAGNOSIS — E0842 Diabetes mellitus due to underlying condition with diabetic polyneuropathy: Secondary | ICD-10-CM

## 2017-01-29 DIAGNOSIS — M792 Neuralgia and neuritis, unspecified: Secondary | ICD-10-CM | POA: Diagnosis not present

## 2017-01-29 NOTE — Progress Notes (Signed)
I went to Mr Sanker' house to meet for the 16:00 appointment we had set. His wife answered the door and said he was not home and she did not know when he would be there. She said he told her he was going to call me to cancel the appointment but I did not receive a call. His wife said she would have him call me when he got home. I asked her to let him know about the appointment I made for him at the neurologist's office and she agreed.   This encounter occurred on 01/28/17 however I could not identify a way to change the date in Epic.

## 2017-01-29 NOTE — Progress Notes (Signed)
Follow-up Visit   Date: 01/29/17    [name] [name] MRN: [id number] DOB: 1950/03/29   Interim History: [name] [name] is a 66 y.o. right-handed African American male with insulin-dependent diabetes mellitus, atrial flutter, CAD, CHF s/p ICD, hypertension, hyperlipidemia, and CKD returning to the clinic for follow-up of diabetic neuropathy.  The patient was accompanied to the clinic by self.  History of present illness: Starting around October 2015 he was admitted with CHF exacerbation and developed severe swelling of the legs. Around the same time, he began noticing tingling pain involving the feet and lower calf which has gradually been worsening over the past year. It is worse after he has been walking all day and seem less painful in the morning. He cannot stand anyone touching his feet, because it irritates it.There is no numbness or weakness.  He endorses difficulty with balance, but has not fallen and walks independently. He has been diabetes for 15+ years and insulin-dependent for most of this time.   He was started on gabapentin and has some mild transient improvement, but despite increasing the dose to 600mg  TID, there was no benefit.   UPDATE 01/29/2017:  He is here for follow-up visit.  He tried Lyrica 75mg  twice daily, but did not appreciate any benefit so switched back to gabapentin 600mg  twice daily, which may help very mildly.  He would like to try higher dose of Lyrica.  Because of his cardiac comorbidities and prolonged QTc, avoid TCAs.  Medications:  Current Outpatient Medications on File Prior to Visit  Medication Sig Dispense Refill  . apixaban (ELIQUIS) 5 MG TABS tablet Take 1 tablet (5 mg total) by mouth 2 (two) times daily. 60 tablet 6  . atorvastatin (LIPITOR) 80 MG tablet TAKE ONE TABLET BY MOUTH ONCE DAILY 90 tablet 3  . Dulaglutide (TRULICITY) 3.84 TX/6.4WO SOPN Inject 0.75 mg into the skin once a week. 4 pen 2  . insulin NPH-regular Human  (NOVOLIN 70/30 RELION) (70-30) 100 UNIT/ML injection Inject 30 Units into the skin 2 (two) times daily with a meal. 10 mL 12  . losartan (COZAAR) 25 MG tablet Take 1 tablet (25 mg total) at bedtime by mouth. 30 tablet 6  . magnesium oxide (MAG-OX) 400 MG tablet Take 1 tablet (400 mg total) by mouth daily. 90 tablet 3  . metFORMIN (GLUCOPHAGE-XR) 750 MG 24 hr tablet TAKE TWO TABLETS BY MOUTH ONCE DAILY WITH BREAKFAST 180 tablet 0  . Multiple Vitamins-Minerals (MULTIVITAMIN PO) Take 1 capsule by mouth daily.     . pregabalin (LYRICA) 100 MG capsule Take 1 capsule (100 mg total) 2 (two) times daily by mouth. 60 capsule 5  . sildenafil (REVATIO) 20 MG tablet Take 1-5 tablets (20-100 mg total) by mouth daily as needed. 50 tablet 0  . sotalol (BETAPACE) 120 MG tablet TAKE ONE TABLET BY MOUTH TWICE DAILY (Patient taking differently: TAKE 120MG  BY MOUTH TWICE DAILY) 180 tablet 3  . torsemide (DEMADEX) 100 MG tablet Take 0.5 tablets (50 mg total) daily by mouth. 30 tablet 6  . potassium chloride SA (K-DUR,KLOR-CON) 20 MEQ tablet Take 1 tablet (20 mEq total) by mouth daily. 30 tablet 0   No current facility-administered medications on file prior to visit.     Allergies:  Allergies  Allergen Reactions  . Penicillins Other (See Comments)    Unknown reaction    Review of Systems:  CONSTITUTIONAL: No fevers, chills, night sweats, or weight loss.  EYES: No visual changes or eye pain  ENT: No hearing changes.  No history of nose bleeds.   RESPIRATORY: No cough, wheezing and shortness of breath.   CARDIOVASCULAR: Negative for chest pain, and palpitations.   GI: Negative for abdominal discomfort, blood in stools or black stools.  No recent change in bowel habits.   GU:  No history of incontinence.   MUSCLOSKELETAL: No history of joint pain or swelling.  No myalgias.   SKIN: Negative for lesions, rash, and itching.   ENDOCRINE: Negative for cold or heat intolerance, polydipsia or goiter.   PSYCH:  No  depression or anxiety symptoms.   NEURO: As Above.   Vital Signs:  BP 100/60   Pulse (!) 58   Ht 5\' 11"  (1.803 m)   Wt 221 lb 8 oz (100.5 kg)   SpO2 97%   BMI 30.89 kg/m   General:  Well appearing, comfortable  Neurological Exam: MENTAL STATUS including orientation to time, place, person, recent and remote memory, attention span and concentration, language, and fund of knowledge is normal.  Speech is not dysarthric.  CRANIAL NERVES:  Face is symmetric.  MOTOR: Motor strength is 5/5 proximally.  SENSORY: Vibration is reduced at the ankles bilaterally  COORDINATION/GAIT:  Gait is slightly wide-based, stable and unassisted  Data: Labs 05/29/2015:  [place] 134, TSH 4.29, HbA1c 11.6*, vitamin B12 751 Lab Results  Component Value Date   HGBA1C 8.0 12/25/2016   Lab Results  Component Value Date   CREATININE 1.47 (H) 01/27/2017   BUN 44 (H) 01/27/2017   NA 132 (L) 01/27/2017   K 4.7 01/27/2017   CL 95 (L) 01/27/2017   CO2 29 01/27/2017    IMPRESSION/PLAN: 1.  Diabetic distal and symmetric polyneuropathy, intractable pain.   - Will try a higher dose of Lyrica 100mg  BID  - Titration schedule of Lyrica starting at 50mg  BID and tapering of gabapentin by 300mg /week was given to patient.  - Avoid TCA due to prolonged QTc  - Consider cymbalta going forward  2.  Chronic kidney disease, cautious with increasing Lyrica further due to worsening renal function  Return to clinic in 2 months  Greater than 50% of this 25 minute visit was spent in counseling, explanation of diagnosis, planning of further management, and coordination of care.  Thank you for allowing me to participate in patient's care.  If I can answer any additional questions, I would be pleased to do so.    Sincerely,    Donika K. Posey Pronto, DO

## 2017-01-29 NOTE — Patient Instructions (Addendum)
Start Lyrica                         AM                   PM Week 1:           50mg                50mg  Week 2:           75mg                75mg  Week 3: 100mg   100mg   Reduce gabapentin by 300mg  each week Week 1: 300mg   600mg  Week 2: 300mg   300mg  Week 3:   300mg  Week 4:   STOP  Call with update in 1 month  Return to clinic on Feb 20th 11:30am.

## 2017-02-01 ENCOUNTER — Telehealth: Payer: Self-pay | Admitting: Neurology

## 2017-02-01 NOTE — Telephone Encounter (Signed)
OK to stop Lyrica.  Start Cymbalta 30mg  daily.

## 2017-02-01 NOTE — Telephone Encounter (Signed)
Patient states that the lyrica cost to much for him to get please call and advise what he needs to do

## 2017-02-01 NOTE — Telephone Encounter (Signed)
I called pharmacy to check on price of cymbalta.  The pharmacist said that the price had dropped about $100 a month and is down to $9 a month.  Rx called in.  Patient notified and will update [place] in a couple of weeks.

## 2017-02-01 NOTE — Telephone Encounter (Signed)
Please advise 

## 2017-02-04 NOTE — Progress Notes (Signed)
No ICM remote transmission received for 01/25/2017 and next ICM transmission scheduled for 02/18/2017.

## 2017-02-05 ENCOUNTER — Ambulatory Visit (HOSPITAL_COMMUNITY): Payer: PPO | Attending: Internal Medicine

## 2017-02-05 ENCOUNTER — Other Ambulatory Visit (HOSPITAL_COMMUNITY): Payer: Self-pay | Admitting: *Deleted

## 2017-02-05 ENCOUNTER — Encounter (HOSPITAL_COMMUNITY): Payer: Self-pay | Admitting: *Deleted

## 2017-02-05 DIAGNOSIS — I5022 Chronic systolic (congestive) heart failure: Secondary | ICD-10-CM | POA: Insufficient documentation

## 2017-02-05 LAB — BASIC METABOLIC PANEL
Anion gap: 10 (ref 5–15)
BUN: 29 mg/dL — AB (ref 6–20)
CHLORIDE: 96 mmol/L — AB (ref 101–111)
CO2: 28 mmol/L (ref 22–32)
Calcium: 10 mg/dL (ref 8.9–10.3)
Creatinine, Ser: 1.37 mg/dL — ABNORMAL HIGH (ref 0.61–1.24)
GFR calc Af Amer: >60 mL/min (ref >60)
GFR calc non Af Amer: 52 mL/min — ABNORMAL LOW (ref >60)
Glucose, Bld: 367 mg/dL — ABNORMAL HIGH (ref 65–99)
POTASSIUM: 4.9 mmol/L (ref 3.5–5.1)
SODIUM: 134 mmol/L — AB (ref 135–145)

## 2017-02-05 NOTE — Progress Notes (Signed)
Patient arrived for CPX with BP 60/40, Resting HR 52, SpO2% 96%. Patient performed pre-exercise spirometry with little to no difficulty. Patient developed dizziness and blurred vision just prior to exercise, requiring him to resume seating. NP- Amy Ninfa Meeker was notified with brief evaluation, further evaluation by Dr. [name]. Patient advised by DB and nurse. CPX will be postponed until BP normalizes. BP check in one week, per Dr. [name].  Patient was grateful and agreeable with plan..   Landis Martins, MS, ACSM-RCEP 02/05/2017 3:56 PM  .

## 2017-02-11 ENCOUNTER — Encounter (HOSPITAL_COMMUNITY): Payer: Self-pay

## 2017-02-11 ENCOUNTER — Ambulatory Visit (HOSPITAL_COMMUNITY)
Admission: RE | Admit: 2017-02-11 | Discharge: 2017-02-11 | Disposition: A | Discharge status: Home / Self Care | Payer: PPO | Source: Ambulatory Visit | Attending: Cardiology | Admitting: Cardiology

## 2017-02-11 DIAGNOSIS — Z0130 Encounter for examination of blood pressure without abnormal findings: Secondary | ICD-10-CM | POA: Insufficient documentation

## 2017-02-11 NOTE — Patient Instructions (Signed)
We will reschedule your CPX test since your BP has recovered.  The morning of your test please hold your torsemide until after your CPX test.  Continue to monitor BP at home and call us if your systolic is below 90.

## 2017-02-11 NOTE — Progress Notes (Signed)
Patient seen today in clinic for BP check.  BP was 98/68 and patient has no complaints.  Spoke with [name] [name] and she advises patient to reschedule his CPX test.  Also patient will hold his torsemide the morning of his CPX.  Patient is agreeable with plan and no further questions.

## 2017-02-12 ENCOUNTER — Other Ambulatory Visit (HOSPITAL_COMMUNITY): Payer: Self-pay

## 2017-02-12 NOTE — Progress Notes (Signed)
Paramedicine Encounter    Patient ID: [name] [name], male    DOB: [birth year], 66 y.o.   MRN: [id number]   Patient Care Team: [name], NP as PCP - General (Nurse Practitioner)  Patient Active Problem List   Diagnosis Date Noted  . Acute on chronic combined systolic and diastolic CHF (congestive heart failure) ([place]) 11/19/2016  . Acute on chronic left and right systolic w/ diastolic heart failure, NYHA class 2 ([place]) 11/18/2016  . Hx of atrial flutter 11/18/2016  . CAD (coronary artery disease)-RCA-T, 70% PL (off CFX), 99% Prox LAD/90% Dist LAD, S/P TAXUS stent x 2 11/18/2016  . HTN (hypertension) 11/18/2016  . Diabetes mellitus with complication ([place]) 10/13/4816  . Thrombocytopenia ([place]) 11/18/2016  . Bilateral lower extremity edema 11/12/2016  . Diabetic polyneuropathy associated with diabetes mellitus due to underlying condition ([place]) 05/29/2015  . Chronic systolic heart failure ([place]) 02/05/2014  . Medtronic biventricular ICD, serial number  [id number] H  02/05/2014  . CAD (coronary artery disease), native coronary artery 01/08/2014  . Chronic renal disease, stage III ([place]) 01/08/2014  . Heart failure ([place]) 12/16/2013  . Mitral regurgitation 04/17/2013  . Chronic systolic dysfunction of left ventricle 06/29/2012  . Atrial flutter ([place]) 06/03/2012  . Acute kidney injury (nontraumatic) ([place]) 05/31/2012  . Chronic anticoagulation   . Acute on chronic systolic heart failure ([place]) 05/21/2012  . Nausea with vomiting 05/18/2012  . PAF (paroxysmal atrial fibrillation) ([place]) 05/17/2012  . Orthopnea 05/17/2012  . TIA (transient ischemic attack) 05/29/2010  . CHEST PAIN 11/13/2008  . VENTRICULAR TACHYCARDIA 08/14/2008  . Automatic implantable cardioverter-defibrillator in situ 08/14/2008  . Type 2 diabetes with nephropathy ([place]) 08/11/2008  . DYSLIPIDEMIA 08/11/2008  . Cardiomyopathy, ischemic 08/11/2008  . Disorder resulting from impaired renal function 08/11/2008  .  HYPERCHOLESTEROLEMIA 04/29/2006  . HYPERTENSION, BENIGN SYSTEMIC 04/29/2006  . MYOCARDIAL INFARCTION, OLD 04/29/2006  . Coronary atherosclerosis 04/29/2006  . NEPHROLITHIASIS 04/29/2006    Current Outpatient Medications:  .  apixaban (ELIQUIS) 5 MG TABS tablet, Take 1 tablet (5 mg total) by mouth 2 (two) times daily., Disp: 60 tablet, Rfl: 6 .  atorvastatin (LIPITOR) 80 MG tablet, TAKE ONE TABLET BY MOUTH ONCE DAILY, Disp: 90 tablet, Rfl: 3 .  Dulaglutide (TRULICITY) 5.63 JS/9.7WY SOPN, Inject 0.75 mg into the skin once a week., Disp: 4 pen, Rfl: 2 .  insulin NPH-regular Human (NOVOLIN 70/30 RELION) (70-30) 100 UNIT/ML injection, Inject 30 Units into the skin 2 (two) times daily with a meal., Disp: 10 mL, Rfl: 12 .  losartan (COZAAR) 25 MG tablet, Take 1 tablet (25 mg total) at bedtime by mouth., Disp: 30 tablet, Rfl: 6 .  magnesium oxide (MAG-OX) 400 MG tablet, Take 1 tablet (400 mg total) by mouth daily., Disp: 90 tablet, Rfl: 3 .  metFORMIN (GLUCOPHAGE-XR) 750 MG 24 hr tablet, TAKE TWO TABLETS BY MOUTH ONCE DAILY WITH BREAKFAST, Disp: 180 tablet, Rfl: 0 .  Multiple Vitamins-Minerals (MULTIVITAMIN PO), Take 1 capsule by mouth daily. , Disp: , Rfl:  .  potassium chloride SA (K-DUR,KLOR-CON) 20 MEQ tablet, Take 1 tablet (20 mEq total) by mouth daily., Disp: 30 tablet, Rfl: 0 .  pregabalin (LYRICA) 100 MG capsule, Take 1 capsule (100 mg total) 2 (two) times daily by mouth., Disp: 60 capsule, Rfl: 5 .  sildenafil (REVATIO) 20 MG tablet, Take 1-5 tablets (20-100 mg total) by mouth daily as needed., Disp: 50 tablet, Rfl: 0 .  sotalol (BETAPACE) 120 MG tablet, TAKE ONE TABLET BY MOUTH TWICE  DAILY (Patient taking differently: TAKE 120MG  BY MOUTH TWICE DAILY), Disp: 180 tablet, Rfl: 3 .  torsemide (DEMADEX) 100 MG tablet, Take 0.5 tablets (50 mg total) daily by mouth. (Patient not taking: Reported on 02/12/2017), Disp: 30 tablet, Rfl: 6 Allergies  Allergen Reactions  . Penicillins Other (See  Comments)    Unknown reaction      Social History   Socioeconomic History  . Marital status: Married    Spouse name: Not on file  . Number of children: 4  . Years of education: 75  . Highest education level: Not on file  Social Needs  . Financial resource strain: Not on file  . Food insecurity - worry: Not on file  . Food insecurity - inability: Not on file  . Transportation needs - medical: Not on file  . Transportation needs - non-medical: Not on file  Occupational History  . Occupation: Psychiatrist - currently unemployed  Tobacco Use  . Smoking status: Former Smoker    Packs/day: 1.00    Years: 29.00    Pack years: 29.00    Types: Cigarettes    Last attempt to quit: 03/02/2000    Years since quitting: 16.9  . Smokeless tobacco: Never Used  Substance and Sexual Activity  . Alcohol use: No    Alcohol/week: 0.0 oz  . Drug use: No  . Sexual activity: Not on file  Other Topics Concern  . Not on file  Social History Narrative   Pt lives with wife in a one story home - married for 77 years. He hunts regularly and is active, walking > 1 mile at times without difficulty.  Has 4 children.     Retired Horticulturist, commercial.  Education: some college.    Physical Exam  Constitutional: He is oriented to person, place, and time.  Cardiovascular: Normal rate and regular rhythm.  Pulmonary/Chest: Effort normal and breath sounds normal. No respiratory distress. He has no wheezes. He has no rales.  Abdominal: Soft. Bowel sounds are normal.  Musculoskeletal: Normal range of motion. He exhibits no edema.  Neurological: He is alert and oriented to person, place, and time.  Skin: Skin is warm and dry.  Psychiatric: He has a normal mood and affect.    SAFE - 02/12/17 1000      Situation   Admitting diagnosis  chf    Heart failure history  Exisiting    Comorbidities  DM;HTN    Readmitted within 30 days  No    Hospital admission within past 12 months  Yes    number of hospital admissions   1    number of ED visits  1      Assessment   Lives alone  No    Primary support person  wife    Mode of transportation  personal car    Home equipement  Scale      Weight   Weighs self daily  Yes    Scale provided  No    Records on weight chart  No      Resources   Has "Living better w/heart failure" book  Yes    Has HF Zone tool  Yes    Able to identify yellow zone signs/when to call MD  Yes    Records zone daily  Yes      Medications   Uses a pill box  Yes    Who stocks the pill box  self    Pill box checked this visit  Yes    Pill box refilled this visit  No    Difficulty obtaining medications  No    Mail order medications  No    Missed one or more doses of medications per week  No      Nutrition   Patient receives meals on wheels  No    Patient follows low sodium diet  Yes    Has foods at home that meet the current recommended diet  Yes    Patient follows low sugar/card diet  Yes    Nutritional concerns/issues  No      Activity Level   ADL's/Mobility  Independent    How many feet can patient ambulate  150    Typical activity level  active    Barriers  hastened pace      Urine   Difficulty urinating  No    Changes in urine  None      Time spent with patient   Time spent with patient   [name] [name] - 02/12/17 1000      Outside of House   Sidewalk and pathway to house is level and free from any hazards  Yes    Driveway is free from debris/snow/ice  Yes    Outside stairs are stable and have sturdy handrail  Yes    Porch lights are working and provide adequate lighting  Yes      Living Room   Furniture is of adequate height and offers arm rests that assist in getting up and down  Yes    Floor is free from any clutter that would create tripping hazards  Yes    All cords are either behind furniture or secured in a manner that does not cause trip hazards  Yes    All rugs are secured to floor with double-sided tape   N/A    Lighting is adequate to light room  Yes    All lighting has an easily accessible on/off switch  Yes    Phone is readily accessible near favorite seating areas  Yes    Emergency numbers are printed near all phones in house  No      Kitchen   Items used most often are within easy reach on low shelves  Yes    Step stool is present, is sturdy and has a handrail  Yes    Floor mats are non-slip tread and secured to floor  Yes    Oven controls are within easy reach  Yes    Kitchen lighting is adequate and easy to reach switches  Yes    ABC fire extinguisher is located in kitchen  No      [place] is properly secured to stairs and/or all wood is properly secured  N/A    Handrail is present and sturdy  N/A    Stairs are free from any clutter  N/A    Stairway is adequately lit  N/A      Bathroom   Tub and shower have a non-slip surface  No    Tub and/or shower have a grab bar for stability  Yes    Toilet has a raised seat  Yes    Grab bar is attached near toilet for assistance  No    Pathway from bedroom to bathroom is free from clutter and well lit for ease of movement in the middle of the night  Yes  Bedroom   Floor is free from clutter  Yes    Light is near bed and is easy to turn on  Yes    Phone is next to bed and within easy reach  Yes    Flashlight is near bed in case of emergency  Yes      General   Smoke detectors in all areas of the house (each floor) and tested  Yes    CO detectors on each floor of house and tested  N/A    Flashlights are handy throughout the home  Yes    Resident has all medical information readily available and in an area emergency providers will easily find  Yes    All heaters are away from any type of flammable material  N/A      Overall Tips   Homeowner ha good non-skid shoes to move around house  Yes    All assisted walking devices are readily accessible and in good condition  N/A    There is a phone near the floor for ease of reach  in case of a fall  YES    All O2 tubing is less than 50 ft. and is not a trip hazard  N/A    Resident has had an annual hearing and vision check by a physician  Yes    Resident has the proper hearing and visual aids prescribed and are in good working order  Yes    All medications are properly stored and labeled to avoid confusion on dosage, time to take, and avoidance of missed doses  Yes        Future Appointments  Date Time Provider [place]  02/17/2017  9:00 AM [facility]  02/18/2017  2:55 PM [facility] DEVICE REMOTES [facility]  03/19/2017 10:00 AM Bensimhon, Shaune Pascal, MD [facility] None  03/29/2017  8:00 AM [name], NP [facility]  04/14/2017 11:30 AM Narda Amber K, DO [facility] None    BP 106/76 (BP Location: Left Arm, Patient Position: Sitting, Cuff Size: Normal)   Pulse 64   Resp 16   Wt 214 lb 14.4 oz (97.5 kg)   SpO2 98%   BMI 29.97 kg/m   Weight yesterday- 214 lb Last visit weight- N/A CBG- 334 mg/dl  [name] [name] was seen at home today for the first time. He reported feeling well. He denied SOB, dizziness, headache, or orthopnea. He reports being compliant with all medications and using a pillbox. He says he fills the box himself and verifies the medications from the pharmacy against his most recent discharge paperwork. His weight has no increased despite stopping torsemide. He reports having increasing CBG's in the past week. I requested he keep a log going forward so we could contact his PCP with more useful information in the event they stay high. He denied change in diet and reports follow low sugar and low sodium recommendations. Both flow sheets were completed. 30 mg Cymbalta needs to be added to his medication list which he began at the beginning of this month. He is currently in the "doughnut hole" for medication costs which has made Eliquis and Lyrica unaffordable. He said he has enough Eliquis to last the rest of the month  with the sample packs from the HF clinic and should have enough Lyrica as well. His medication coverage will start over next month.   Time spent with patient: 36 minutes  Jacquiline Doe, EMT 02/12/17  ACTION: Home visit completed  Next visit planned for 1 week

## 2017-02-15 ENCOUNTER — Other Ambulatory Visit: Payer: Self-pay | Admitting: Internal Medicine

## 2017-02-17 ENCOUNTER — Other Ambulatory Visit (HOSPITAL_COMMUNITY): Payer: Self-pay

## 2017-02-17 ENCOUNTER — Encounter (HOSPITAL_COMMUNITY): Payer: Self-pay

## 2017-02-17 ENCOUNTER — Ambulatory Visit (HOSPITAL_COMMUNITY)
Admission: RE | Admit: 2017-02-17 | Discharge: 2017-02-17 | Disposition: A | Discharge status: Home / Self Care | Payer: PPO | Source: Ambulatory Visit | Attending: Cardiology | Admitting: Cardiology

## 2017-02-17 ENCOUNTER — Ambulatory Visit (HOSPITAL_COMMUNITY): Payer: PPO

## 2017-02-17 VITALS — BP 106/70 | Wt 216.0 lb

## 2017-02-17 DIAGNOSIS — Z8249 Family history of ischemic heart disease and other diseases of the circulatory system: Secondary | ICD-10-CM | POA: Insufficient documentation

## 2017-02-17 DIAGNOSIS — I959 Hypotension, unspecified: Secondary | ICD-10-CM | POA: Insufficient documentation

## 2017-02-17 DIAGNOSIS — Z95810 Presence of automatic (implantable) cardiac defibrillator: Secondary | ICD-10-CM | POA: Diagnosis not present

## 2017-02-17 DIAGNOSIS — N183 Chronic kidney disease, stage 3 (moderate): Secondary | ICD-10-CM | POA: Insufficient documentation

## 2017-02-17 DIAGNOSIS — Z79899 Other long term (current) drug therapy: Secondary | ICD-10-CM | POA: Diagnosis not present

## 2017-02-17 DIAGNOSIS — I130 Hypertensive heart and chronic kidney disease with heart failure and stage 1 through stage 4 chronic kidney disease, or unspecified chronic kidney disease: Secondary | ICD-10-CM | POA: Insufficient documentation

## 2017-02-17 DIAGNOSIS — E11319 Type 2 diabetes mellitus with unspecified diabetic retinopathy without macular edema: Secondary | ICD-10-CM | POA: Insufficient documentation

## 2017-02-17 DIAGNOSIS — Z833 Family history of diabetes mellitus: Secondary | ICD-10-CM | POA: Diagnosis not present

## 2017-02-17 DIAGNOSIS — I5082 Biventricular heart failure: Secondary | ICD-10-CM | POA: Diagnosis not present

## 2017-02-17 DIAGNOSIS — Z87891 Personal history of nicotine dependence: Secondary | ICD-10-CM | POA: Diagnosis not present

## 2017-02-17 DIAGNOSIS — Z9889 Other specified postprocedural states: Secondary | ICD-10-CM | POA: Insufficient documentation

## 2017-02-17 DIAGNOSIS — I4892 Unspecified atrial flutter: Secondary | ICD-10-CM | POA: Insufficient documentation

## 2017-02-17 DIAGNOSIS — R0600 Dyspnea, unspecified: Secondary | ICD-10-CM

## 2017-02-17 DIAGNOSIS — Z820 Family history of epilepsy and other diseases of the nervous system: Secondary | ICD-10-CM | POA: Diagnosis not present

## 2017-02-17 DIAGNOSIS — I480 Paroxysmal atrial fibrillation: Secondary | ICD-10-CM | POA: Insufficient documentation

## 2017-02-17 DIAGNOSIS — I519 Heart disease, unspecified: Secondary | ICD-10-CM

## 2017-02-17 DIAGNOSIS — I2510 Atherosclerotic heart disease of native coronary artery without angina pectoris: Secondary | ICD-10-CM | POA: Insufficient documentation

## 2017-02-17 DIAGNOSIS — I493 Ventricular premature depolarization: Secondary | ICD-10-CM | POA: Insufficient documentation

## 2017-02-17 DIAGNOSIS — Z7901 Long term (current) use of anticoagulants: Secondary | ICD-10-CM | POA: Insufficient documentation

## 2017-02-17 DIAGNOSIS — E7800 Pure hypercholesterolemia, unspecified: Secondary | ICD-10-CM | POA: Diagnosis not present

## 2017-02-17 DIAGNOSIS — I5022 Chronic systolic (congestive) heart failure: Secondary | ICD-10-CM | POA: Insufficient documentation

## 2017-02-17 DIAGNOSIS — Z880 Allergy status to penicillin: Secondary | ICD-10-CM | POA: Insufficient documentation

## 2017-02-17 DIAGNOSIS — Z794 Long term (current) use of insulin: Secondary | ICD-10-CM | POA: Diagnosis not present

## 2017-02-17 DIAGNOSIS — I255 Ischemic cardiomyopathy: Secondary | ICD-10-CM | POA: Insufficient documentation

## 2017-02-17 DIAGNOSIS — E1122 Type 2 diabetes mellitus with diabetic chronic kidney disease: Secondary | ICD-10-CM | POA: Diagnosis not present

## 2017-02-17 LAB — COMPREHENSIVE METABOLIC PANEL
ALBUMIN: 3.4 g/dL — AB (ref 3.5–5.0)
ALK PHOS: 81 U/L (ref 38–126)
ALT: 19 U/L (ref 17–63)
ANION GAP: 12 (ref 5–15)
AST: 25 U/L (ref 15–41)
BILIRUBIN TOTAL: 1 mg/dL (ref 0.3–1.2)
BUN: 22 mg/dL — AB (ref 6–20)
CALCIUM: 9.4 mg/dL (ref 8.9–10.3)
CO2: 22 mmol/L (ref 22–32)
Chloride: 94 mmol/L — ABNORMAL LOW (ref 101–111)
Creatinine, Ser: 1.44 mg/dL — ABNORMAL HIGH (ref 0.61–1.24)
GFR calc Af Amer: 57 mL/min — ABNORMAL LOW (ref >60)
GFR, EST NON AFRICAN AMERICAN: 49 mL/min — AB (ref >60)
GLUCOSE: 240 mg/dL — AB (ref 65–99)
POTASSIUM: 4.5 mmol/L (ref 3.5–5.1)
Sodium: 128 mmol/L — ABNORMAL LOW (ref 135–145)
TOTAL PROTEIN: 7.9 g/dL (ref 6.5–8.1)

## 2017-02-17 LAB — CBC
HEMATOCRIT: 44.4 % (ref 39.0–52.0)
HEMOGLOBIN: 14.8 g/dL (ref 13.0–17.0)
MCH: 27.2 pg (ref 26.0–34.0)
MCHC: 33.3 g/dL (ref 30.0–36.0)
MCV: 81.6 fL (ref 78.0–100.0)
Platelets: 151 10*3/uL (ref 150–400)
RBC: 5.44 MIL/uL (ref 4.22–5.81)
RDW: 16.4 % — AB (ref 11.5–15.5)
WBC: 4.6 10*3/uL (ref 4.0–10.5)

## 2017-02-17 LAB — TSH: TSH: 5.389 u[IU]/mL — ABNORMAL HIGH (ref 0.350–4.500)

## 2017-02-17 NOTE — Progress Notes (Signed)
Advanced Heart Failure Clinic Note     Primary Cardiologist: Dr. [name] Primary HF: Dr. [name]   HPI: [name] [name] is a 66 y.o. 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   Pt presented to [facility] 11/18/16 with worsening SOB and 24 lb weight gain despite his lasix being increased by PCP. Echo 11/19/16 with LVEF 15-20%, Mod MR, Mod LAE, Mild RV dilation and moderately reduced function, Moderate TR, PA peak pressure 44 mm Hg. Device interrogation showed recent bout of AF but was back in NSR. Suspect AF contributing to decompensation. Work up planned for possible transplant, RV function probably will not support LVAD. Discharge weight was 225 pounds on 11/27/16.   Today he was worked in for an acute visit. Earlier this am he completed CPX test but had to stop due to leg fatigue. He was hypotensive during the test.  CPX submaximal test due to leg weakness/neuropathy. RER only 0.90. pVO2 ~10 with Slope 45-50. SBP dropped from 90 >80 when standing. Overall feeling bad. Complaining of fatigue and has to take breaks when he walks. Weight at home trending down from 219 to 214 pounds. Appetite has gone down. No CP. Denies orthopnea/PND. Taking all medications.   Review of systems complete and found to be negative unless listed in HPI.    Past Medical History:  Diagnosis Date  . AICD (automatic cardioverter/defibrillator) present 02/05/2014   Upgrade to Medtronic biventricular ICD, serial number  BLD [id number]   . Atrial flutter ([place]) 04/2012   s/p TEE-EPS+RFCA 04/2012  . CAD (coronary artery disease) 6063,0160 X 2    RCA-T, 70% PL (off CFX), 99% Prox LAD/90% Dist LAD, S/P TAXUS stent x 2  . CHF (congestive heart failure) ([place])   . Chronic anticoagulation   . Chronic systolic heart failure ([place])   . CKD (chronic kidney disease)   . Diabetic retinopathy ([place])   . DM  type 2 (diabetes mellitus, type 2) (HCC)    insulin dependent  . HTN (hypertension)   . Hypercholesteremia    ablation  . ICD (implantable cardiac defibrillator) in place   . Ischemic cardiomyopathy March 2015   20-25% 2D   . Nephrolithiasis   . Ventricular tachycardia ([place])     Current Outpatient Medications  Medication Sig Dispense Refill  . apixaban (ELIQUIS) 5 MG TABS tablet Take 1 tablet (5 mg total) by mouth 2 (two) times daily. 60 tablet 6  . atorvastatin (LIPITOR) 80 MG tablet TAKE ONE TABLET BY MOUTH ONCE DAILY 90 tablet 3  . Dulaglutide (TRULICITY) 1.09 NA/3.5TD SOPN Inject 0.75 mg into the skin once a week. 4 pen 2  . insulin NPH-regular Human (NOVOLIN 70/30 RELION) (70-30) 100 UNIT/ML injection Inject 30 Units into the skin 2 (two) times daily with a meal. 10 mL 12  . losartan (COZAAR) 25 MG tablet Take 1 tablet (25 mg total) at bedtime by mouth. 30 tablet 6  . magnesium oxide (MAG-OX) 400 MG tablet Take 1 tablet (400 mg total) by mouth daily. 90 tablet 3  . metFORMIN (GLUCOPHAGE-XR) 750 MG 24 hr tablet TAKE TWO TABLETS BY MOUTH ONCE DAILY WITH BREAKFAST 180 tablet 0  . Multiple Vitamins-Minerals (MULTIVITAMIN PO) Take 1 capsule by mouth daily.     . potassium chloride SA (K-DUR,KLOR-CON) 20 MEQ tablet Take 1 tablet (20 mEq total) by mouth  daily. 30 tablet 0  . pregabalin (LYRICA) 100 MG capsule Take 1 capsule (100 mg total) 2 (two) times daily by mouth. 60 capsule 5  . sotalol (BETAPACE) 120 MG tablet TAKE ONE TABLET BY MOUTH TWICE DAILY (Patient taking differently: TAKE 120MG  BY MOUTH TWICE DAILY) 180 tablet 3  . torsemide (DEMADEX) 100 MG tablet Take 0.5 tablets (50 mg total) daily by mouth. 30 tablet 6  . sildenafil (REVATIO) 20 MG tablet Take 1-5 tablets (20-100 mg total) by mouth daily as needed. (Patient not taking: Reported on 02/17/2017) 50 tablet 0   No current facility-administered medications for this encounter.     Allergies  Allergen Reactions  . Penicillins  Other (See Comments)    Unknown reaction      Social History   Socioeconomic History  . Marital status: Married    Spouse name: Not on file  . Number of children: 4  . Years of education: 76  . Highest education level: Not on file  Social Needs  . Financial resource strain: Not on file  . Food insecurity - worry: Not on file  . Food insecurity - inability: Not on file  . Transportation needs - medical: Not on file  . Transportation needs - non-medical: Not on file  Occupational History  . Occupation: Psychiatrist - currently unemployed  Tobacco Use  . Smoking status: Former Smoker    Packs/day: 1.00    Years: 29.00    Pack years: 29.00    Types: Cigarettes    Last attempt to quit: 03/02/2000    Years since quitting: 16.9  . Smokeless tobacco: Never Used  Substance and Sexual Activity  . Alcohol use: No    Alcohol/week: 0.0 oz  . Drug use: No  . Sexual activity: Not on file  Other Topics Concern  . Not on file  Social History Narrative   Pt lives with wife in a one story home - married for 2 years. He hunts regularly and is active, walking > 1 mile at times without difficulty.  Has 4 children.     Retired Horticulturist, commercial.  Education: some college.   Family History  Problem Relation Age of Onset  . Heart failure Father        Deceased  . Alzheimer's disease Mother        Living  . Heart attack Mother   . CAD Brother   . CAD Brother   . CAD Brother   . CAD Brother   . Healthy Son   . Healthy Daughter   . Diabetes Brother    Vitals:   02/17/17 1056  BP: 106/70  Weight: 216 lb (98 kg)   Wt Readings from Last 3 Encounters:  02/17/17 216 lb (98 kg)  02/12/17 214 lb 14.4 oz (97.5 kg)  02/11/17 222 lb (100.7 kg)    PHYSICAL EXAM: General:  Appears fatigued. No resp difficulty HEENT: normal Neck: supple. JVP 5-6 . Carotids 2+ bilat; no bruits. No lymphadenopathy or thryomegaly appreciated. Cor: PMI laterally displaced. Regular rate & rhythm. No rubs, gallops or  murmurs. +s3 Lungs: clear Abdomen: soft, nontender, nondistended. No hepatosplenomegaly. No bruits or masses. Good bowel sounds. Extremities: no cyanosis, clubbing, rash, edema Neuro: alert & orientedx3, cranial nerves grossly intact. moves all 4 extremities w/o difficulty. Affect pleasant  EKG: AV paced 55 bpm  ASSESSMENT & PLAN: 1. Chronic systolic CHF: - Echo 08/07/35 EF 15-20% Biventricular Failure with RV dysfunction by echo this  He has Medtronic BiV  ICD in place. Combined ICM and NICM.  - NYHA III. Functional decline. Volume status low. Hold torsemide.  Continue current dose losartan.  On sotalol for VT. Continue for now.  He has not been on dig due to bradycardia.     2. [name] - No s/s of ischemia.    - Continue ASA and statis.  - LHC 7/06: EF 35-40%, mLAD 99%, dLAD 90%, mid to dist CFX 70%, RCA occluded. PCI: DES to mid and dist LAD. Carotids 4/12: 0-39% bilat ICA - Had unremarkable Stress test in 2013. Prior apical and inferior infarct.   3. HTN - BP low today. .   4. [name] - Per PCP.   5. CKD III - Check BMET today.   6. [name] - s/p ICD - Continue sotalol.   7. PAF s/p RFCA 04/2012 - In NSR today.     - Continue Eliquis.  - Maintaining NSR. Hold eliquis for cath in am.   CPX today  VO2 8.2 Slope 53 RER 0.9  Stopped early for leg fatigue.   I am concerned that he has low output HF with progressive functional decline and that he is moving towards advanced therapies.  We discussed advanced therapies such as transplant and heart pump and that he may need home inotropes or palliative care.   Today he was set up for RHC/LHC tomorrow. Plan to hold torsemide and eliquis for now and address post cath. He understands he will may require hospital admit based on cath results.   Check labs for cath today.   Discussed with Dr Donnamae Jude, NP 02/17/17   Patient seen and examined with Darrick Grinder, NP. We discussed all aspects of the  encounter. I agree with the assessment and plan as stated above.   Patient with progressive, advanced HF symptoms now NYHA IIIB. CPX testing reviewed with him and his family in detail. Echo also reviewed personally EF 15-20% with moderate RV dysfunction.  Although submax effort very high VEVCO2 slop and hypotension with exercise suggests severe HF limitation. We discussed possible need for advanced therapies. Will proceed with L/R heart cath tomorrow to further evaluate.   Total time spent 45 minutes. Over half that time spent discussing above.   Glori Bickers, MD  2:12 PM

## 2017-02-17 NOTE — Patient Instructions (Signed)
You have been scheduled for a heart catheterization. Please see instruction sheet for additional details.  Follow up ________________________________________________ Marin Roberts Code:  Take all medication as prescribed the day of your appointment. Bring all medications with you to your appointment.  Do the following things EVERYDAY: 1) Weigh yourself in the morning before breakfast. Write it down and keep it in a log. 2) Take your medicines as prescribed 3) Eat low salt foods-Limit salt (sodium) to 2000 mg per day.  4) Stay as active as you can everyday 5) Limit all fluids for the day to less than 2 liters

## 2017-02-17 NOTE — H&P (View-Only) (Signed)
Advanced Heart Failure Clinic Note     Primary Cardiologist: Dr. [name] Primary HF: Dr. [name]   HPI: [name] [name] is a 66 y.o. 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   Pt presented to [facility] 11/18/16 with worsening SOB and 24 lb weight gain despite his lasix being increased by PCP. Echo 11/19/16 with LVEF 15-20%, Mod MR, Mod LAE, Mild RV dilation and moderately reduced function, Moderate TR, PA peak pressure 44 mm Hg. Device interrogation showed recent bout of AF but was back in NSR. Suspect AF contributing to decompensation. Work up planned for possible transplant, RV function probably will not support LVAD. Discharge weight was 225 pounds on 11/27/16.   Today he was worked in for an acute visit. Earlier this am he completed CPX test but had to stop due to leg fatigue. He was hypotensive during the test.  CPX submaximal test due to leg weakness/neuropathy. RER only 0.90. pVO2 ~10 with Slope 45-50. SBP dropped from 90 >80 when standing. Overall feeling bad. Complaining of fatigue and has to take breaks when he walks. Weight at home trending down from 219 to 214 pounds. Appetite has gone down. No CP. Denies orthopnea/PND. Taking all medications.   Review of systems complete and found to be negative unless listed in HPI.    Past Medical History:  Diagnosis Date  . AICD (automatic cardioverter/defibrillator) present 02/05/2014   Upgrade to Medtronic biventricular ICD, serial number  BLD [id number] H   . Atrial flutter ([place]) 04/2012   s/p TEE-EPS+RFCA 04/2012  . CAD (coronary artery disease) 5732,2025 X 2    RCA-T, 70% PL (off CFX), 99% Prox LAD/90% Dist LAD, S/P TAXUS stent x 2  . CHF (congestive heart failure) ([place])   . Chronic anticoagulation   . Chronic systolic heart failure ([place])   . CKD (chronic kidney disease)   . Diabetic retinopathy ([place])   . DM  type 2 (diabetes mellitus, type 2) (HCC)    insulin dependent  . HTN (hypertension)   . Hypercholesteremia    ablation  . ICD (implantable cardiac defibrillator) in place   . Ischemic cardiomyopathy March 2015   20-25% 2D   . Nephrolithiasis   . Ventricular tachycardia ([place])     Current Outpatient Medications  Medication Sig Dispense Refill  . apixaban (ELIQUIS) 5 MG TABS tablet Take 1 tablet (5 mg total) by mouth 2 (two) times daily. 60 tablet 6  . atorvastatin (LIPITOR) 80 MG tablet TAKE ONE TABLET BY MOUTH ONCE DAILY 90 tablet 3  . Dulaglutide (TRULICITY) 4.27 CW/2.3JS SOPN Inject 0.75 mg into the skin once a week. 4 pen 2  . insulin NPH-regular Human (NOVOLIN 70/30 RELION) (70-30) 100 UNIT/ML injection Inject 30 Units into the skin 2 (two) times daily with a meal. 10 mL 12  . losartan (COZAAR) 25 MG tablet Take 1 tablet (25 mg total) at bedtime by mouth. 30 tablet 6  . magnesium oxide (MAG-OX) 400 MG tablet Take 1 tablet (400 mg total) by mouth daily. 90 tablet 3  . metFORMIN (GLUCOPHAGE-XR) 750 MG 24 hr tablet TAKE TWO TABLETS BY MOUTH ONCE DAILY WITH BREAKFAST 180 tablet 0  . Multiple Vitamins-Minerals (MULTIVITAMIN PO) Take 1 capsule by mouth daily.     . potassium chloride SA (K-DUR,KLOR-CON) 20 MEQ tablet Take 1 tablet (20 mEq total) by mouth  daily. 30 tablet 0  . pregabalin (LYRICA) 100 MG capsule Take 1 capsule (100 mg total) 2 (two) times daily by mouth. 60 capsule 5  . sotalol (BETAPACE) 120 MG tablet TAKE ONE TABLET BY MOUTH TWICE DAILY (Patient taking differently: TAKE 120MG  BY MOUTH TWICE DAILY) 180 tablet 3  . torsemide (DEMADEX) 100 MG tablet Take 0.5 tablets (50 mg total) daily by mouth. 30 tablet 6  . sildenafil (REVATIO) 20 MG tablet Take 1-5 tablets (20-100 mg total) by mouth daily as needed. (Patient not taking: Reported on 02/17/2017) 50 tablet 0   No current facility-administered medications for this encounter.     Allergies  Allergen Reactions  . Penicillins  Other (See Comments)    Unknown reaction      Social History   Socioeconomic History  . Marital status: Married    Spouse name: Not on file  . Number of children: 4  . Years of education: 18  . Highest education level: Not on file  Social Needs  . Financial resource strain: Not on file  . Food insecurity - worry: Not on file  . Food insecurity - inability: Not on file  . Transportation needs - medical: Not on file  . Transportation needs - non-medical: Not on file  Occupational History  . Occupation: Psychiatrist - currently unemployed  Tobacco Use  . Smoking status: Former Smoker    Packs/day: 1.00    Years: 29.00    Pack years: 29.00    Types: Cigarettes    Last attempt to quit: 03/02/2000    Years since quitting: 16.9  . Smokeless tobacco: Never Used  Substance and Sexual Activity  . Alcohol use: No    Alcohol/week: 0.0 oz  . Drug use: No  . Sexual activity: Not on file  Other Topics Concern  . Not on file  Social History Narrative   Pt lives with wife in a one story home - married for 83 years. He hunts regularly and is active, walking > 1 mile at times without difficulty.  Has 4 children.     Retired Horticulturist, commercial.  Education: some college.   Family History  Problem Relation Age of Onset  . Heart failure Father        Deceased  . Alzheimer's disease Mother        Living  . Heart attack Mother   . CAD Brother   . CAD Brother   . CAD Brother   . CAD Brother   . Healthy Son   . Healthy Daughter   . Diabetes Brother    Vitals:   02/17/17 1056  BP: 106/70  Weight: 216 lb (98 kg)   Wt Readings from Last 3 Encounters:  02/17/17 216 lb (98 kg)  02/12/17 214 lb 14.4 oz (97.5 kg)  02/11/17 222 lb (100.7 kg)    PHYSICAL EXAM: General:  Appears fatigued. No resp difficulty HEENT: normal Neck: supple. JVP 5-6 . Carotids 2+ bilat; no bruits. No lymphadenopathy or thryomegaly appreciated. Cor: PMI laterally displaced. Regular rate & rhythm. No rubs, gallops or  murmurs. +s3 Lungs: clear Abdomen: soft, nontender, nondistended. No hepatosplenomegaly. No bruits or masses. Good bowel sounds. Extremities: no cyanosis, clubbing, rash, edema Neuro: alert & orientedx3, cranial nerves grossly intact. moves all 4 extremities w/o difficulty. Affect pleasant  EKG: AV paced 55 bpm  ASSESSMENT & PLAN: 1. Chronic systolic CHF: - Echo 1/61/09 EF 15-20% Biventricular Failure with RV dysfunction by echo this  He has Medtronic BiV  ICD in place. Combined ICM and NICM.  - NYHA III. Functional decline. Volume status low. Hold torsemide.  Continue current dose losartan.  On sotalol for VT. Continue for now.  He has not been on dig due to bradycardia.     2. [name] - No s/s of ischemia.    - Continue ASA and statis.  - LHC 7/06: EF 35-40%, mLAD 99%, dLAD 90%, mid to dist CFX 70%, RCA occluded. PCI: DES to mid and dist LAD. Carotids 4/12: 0-39% bilat ICA - Had unremarkable Stress test in 2013. Prior apical and inferior infarct.   3. HTN - BP low today. .   4. [name] - Per PCP.   5. CKD III - Check BMET today.   6. [name] - s/p ICD - Continue sotalol.   7. PAF s/p RFCA 04/2012 - In NSR today.     - Continue Eliquis.  - Maintaining NSR. Hold eliquis for cath in am.   CPX today  VO2 8.2 Slope 53 RER 0.9  Stopped early for leg fatigue.   I am concerned that he has low output HF with progressive functional decline and that he is moving towards advanced therapies.  We discussed advanced therapies such as transplant and heart pump and that he may need home inotropes or palliative care.   Today he was set up for RHC/LHC tomorrow. Plan to hold torsemide and eliquis for now and address post cath. He understands he will may require hospital admit based on cath results.   Check labs for cath today.   Discussed with Dr Donnamae Jude, NP 02/17/17   Patient seen and examined with Darrick Grinder, NP. We discussed all aspects of the  encounter. I agree with the assessment and plan as stated above.   Patient with progressive, advanced HF symptoms now NYHA IIIB. CPX testing reviewed with him and his family in detail. Echo also reviewed personally EF 15-20% with moderate RV dysfunction.  Although submax effort very high VEVCO2 slop and hypotension with exercise suggests severe HF limitation. We discussed possible need for advanced therapies. Will proceed with L/R heart cath tomorrow to further evaluate.   Total time spent 45 minutes. Over half that time spent discussing above.   Glori Bickers, MD  2:12 PM

## 2017-02-18 ENCOUNTER — Other Ambulatory Visit (HOSPITAL_COMMUNITY): Payer: Self-pay

## 2017-02-18 ENCOUNTER — Encounter (HOSPITAL_COMMUNITY)
Admission: RE | Disposition: A | Discharge status: Home / Self Care | Payer: Self-pay | Source: Ambulatory Visit | Attending: Internal Medicine

## 2017-02-18 ENCOUNTER — Ambulatory Visit (HOSPITAL_COMMUNITY)
Admission: RE | Admit: 2017-02-18 | Discharge: 2017-02-18 | Disposition: A | Discharge status: Home / Self Care | Payer: PPO | Source: Ambulatory Visit | Attending: Internal Medicine | Admitting: Internal Medicine

## 2017-02-18 ENCOUNTER — Telehealth (HOSPITAL_COMMUNITY): Payer: Self-pay

## 2017-02-18 ENCOUNTER — Ambulatory Visit (INDEPENDENT_AMBULATORY_CARE_PROVIDER_SITE_OTHER): Payer: PPO

## 2017-02-18 DIAGNOSIS — I480 Paroxysmal atrial fibrillation: Secondary | ICD-10-CM | POA: Insufficient documentation

## 2017-02-18 DIAGNOSIS — E1122 Type 2 diabetes mellitus with diabetic chronic kidney disease: Secondary | ICD-10-CM | POA: Diagnosis not present

## 2017-02-18 DIAGNOSIS — I130 Hypertensive heart and chronic kidney disease with heart failure and stage 1 through stage 4 chronic kidney disease, or unspecified chronic kidney disease: Secondary | ICD-10-CM | POA: Diagnosis not present

## 2017-02-18 DIAGNOSIS — Z794 Long term (current) use of insulin: Secondary | ICD-10-CM | POA: Diagnosis not present

## 2017-02-18 DIAGNOSIS — I5022 Chronic systolic (congestive) heart failure: Secondary | ICD-10-CM | POA: Insufficient documentation

## 2017-02-18 DIAGNOSIS — R001 Bradycardia, unspecified: Secondary | ICD-10-CM | POA: Diagnosis not present

## 2017-02-18 DIAGNOSIS — I2582 Chronic total occlusion of coronary artery: Secondary | ICD-10-CM | POA: Diagnosis not present

## 2017-02-18 DIAGNOSIS — Z79899 Other long term (current) drug therapy: Secondary | ICD-10-CM | POA: Diagnosis not present

## 2017-02-18 DIAGNOSIS — I2510 Atherosclerotic heart disease of native coronary artery without angina pectoris: Secondary | ICD-10-CM | POA: Diagnosis not present

## 2017-02-18 DIAGNOSIS — Z95810 Presence of automatic (implantable) cardiac defibrillator: Secondary | ICD-10-CM | POA: Diagnosis not present

## 2017-02-18 DIAGNOSIS — R0600 Dyspnea, unspecified: Secondary | ICD-10-CM

## 2017-02-18 DIAGNOSIS — Z87891 Personal history of nicotine dependence: Secondary | ICD-10-CM | POA: Insufficient documentation

## 2017-02-18 DIAGNOSIS — N183 Chronic kidney disease, stage 3 (moderate): Secondary | ICD-10-CM | POA: Insufficient documentation

## 2017-02-18 DIAGNOSIS — I255 Ischemic cardiomyopathy: Secondary | ICD-10-CM | POA: Diagnosis not present

## 2017-02-18 DIAGNOSIS — E7800 Pure hypercholesterolemia, unspecified: Secondary | ICD-10-CM | POA: Diagnosis not present

## 2017-02-18 DIAGNOSIS — E11319 Type 2 diabetes mellitus with unspecified diabetic retinopathy without macular edema: Secondary | ICD-10-CM | POA: Insufficient documentation

## 2017-02-18 DIAGNOSIS — Z7901 Long term (current) use of anticoagulants: Secondary | ICD-10-CM | POA: Insufficient documentation

## 2017-02-18 HISTORY — PX: RIGHT/LEFT HEART CATH AND CORONARY ANGIOGRAPHY: CATH118266

## 2017-02-18 LAB — POCT I-STAT 3, ART BLOOD GAS (G3+)
Acid-Base Excess: 2 mmol/L (ref 0.0–2.0)
BICARBONATE: 27.3 mmol/L (ref 20.0–28.0)
O2 Saturation: 99 %
PCO2 ART: 42.9 mmHg (ref 32.0–48.0)
PH ART: 7.412 (ref 7.350–7.450)
PO2 ART: 146 mmHg — AB (ref 83.0–108.0)
TCO2: 29 mmol/L (ref 22–32)

## 2017-02-18 LAB — POCT I-STAT 3, VENOUS BLOOD GAS (G3P V)
Acid-base deficit: 2 mmol/L (ref 0.0–2.0)
Acid-base deficit: 2 mmol/L (ref 0.0–2.0)
BICARBONATE: 24.1 mmol/L (ref 20.0–28.0)
Bicarbonate: 24.2 mmol/L (ref 20.0–28.0)
O2 SAT: 61 %
O2 Saturation: 60 %
PCO2 VEN: 44.4 mmHg (ref 44.0–60.0)
PCO2 VEN: 44.5 mmHg (ref 44.0–60.0)
PH VEN: 7.341 (ref 7.250–7.430)
PH VEN: 7.345 (ref 7.250–7.430)
PO2 VEN: 33 mmHg (ref 32.0–45.0)
PO2 VEN: 34 mmHg (ref 32.0–45.0)
TCO2: 25 mmol/L (ref 22–32)
TCO2: 26 mmol/L (ref 22–32)

## 2017-02-18 LAB — GLUCOSE, CAPILLARY
GLUCOSE-CAPILLARY: 197 mg/dL — AB (ref 65–99)
Glucose-Capillary: 184 mg/dL — ABNORMAL HIGH (ref 65–99)

## 2017-02-18 LAB — POCT ACTIVATED CLOTTING TIME: Activated Clotting Time: 186 seconds

## 2017-02-18 SURGERY — RIGHT/LEFT HEART CATH AND CORONARY ANGIOGRAPHY
Anesthesia: LOCAL

## 2017-02-18 MED ORDER — SODIUM CHLORIDE 0.9% FLUSH: 3.0000 mL | Freq: Two times a day (BID) | INTRAVENOUS | Status: DC

## 2017-02-18 MED ORDER — IOPAMIDOL (ISOVUE-370) INJECTION 76%
INTRAVENOUS | Status: AC
  Filled 2017-02-18: qty 100

## 2017-02-18 MED ORDER — HEPARIN (PORCINE) IN NACL 2-0.9 UNIT/ML-% IJ SOLN
INTRAMUSCULAR | Status: DC | PRN
  Administered 2017-02-18: 1000 mL

## 2017-02-18 MED ORDER — ASPIRIN 81 MG PO CHEW
81.0000 mg | CHEWABLE_TABLET | ORAL | Status: AC
  Administered 2017-02-18: 81 mg via ORAL

## 2017-02-18 MED ORDER — IOPAMIDOL (ISOVUE-370) INJECTION 76%
INTRAVENOUS | Status: DC | PRN
  Administered 2017-02-18: 110 mL via INTRA_ARTERIAL

## 2017-02-18 MED ORDER — SODIUM CHLORIDE 0.9 % IV SOLN: 250.0000 mL | INTRAVENOUS | Status: DC | PRN

## 2017-02-18 MED ORDER — ACETAMINOPHEN 325 MG PO TABS: 650.0000 mg | ORAL_TABLET | ORAL | Status: DC | PRN

## 2017-02-18 MED ORDER — LIDOCAINE HCL (PF) 1 % IJ SOLN
INTRAMUSCULAR | Status: DC | PRN
  Administered 2017-02-18: 5 mL
  Administered 2017-02-18: 2 mL

## 2017-02-18 MED ORDER — SODIUM CHLORIDE 0.9% FLUSH: 3.0000 mL | INTRAVENOUS | Status: DC | PRN

## 2017-02-18 MED ORDER — VERAPAMIL HCL 2.5 MG/ML IV SOLN
INTRAVENOUS | Status: AC
  Filled 2017-02-18: qty 2

## 2017-02-18 MED ORDER — VERAPAMIL HCL 2.5 MG/ML IV SOLN
INTRAVENOUS | Status: DC | PRN
  Administered 2017-02-18: 10 mL via INTRA_ARTERIAL

## 2017-02-18 MED ORDER — FENTANYL CITRATE (PF) 100 MCG/2ML IJ SOLN
INTRAMUSCULAR | Status: DC | PRN
  Administered 2017-02-18: 25 ug via INTRAVENOUS

## 2017-02-18 MED ORDER — MIDAZOLAM HCL 2 MG/2ML IJ SOLN
INTRAMUSCULAR | Status: AC
  Filled 2017-02-18: qty 2

## 2017-02-18 MED ORDER — IOPAMIDOL (ISOVUE-300) INJECTION 61%
INTRAVENOUS | Status: AC
  Filled 2017-02-18: qty 50

## 2017-02-18 MED ORDER — ASPIRIN 81 MG PO CHEW
CHEWABLE_TABLET | ORAL | Status: AC
  Administered 2017-02-18: 81 mg via ORAL
  Filled 2017-02-18: qty 1

## 2017-02-18 MED ORDER — FENTANYL CITRATE (PF) 100 MCG/2ML IJ SOLN
INTRAMUSCULAR | Status: AC
  Filled 2017-02-18: qty 2

## 2017-02-18 MED ORDER — HEPARIN SODIUM (PORCINE) 1000 UNIT/ML IJ SOLN
INTRAMUSCULAR | Status: DC | PRN
  Administered 2017-02-18: 4500 [IU] via INTRAVENOUS

## 2017-02-18 MED ORDER — SODIUM CHLORIDE 0.9 % IV SOLN
INTRAVENOUS | Status: DC
  Administered 2017-02-18: 08:00:00 via INTRAVENOUS

## 2017-02-18 MED ORDER — HEPARIN SODIUM (PORCINE) 1000 UNIT/ML IJ SOLN
INTRAMUSCULAR | Status: AC
  Filled 2017-02-18: qty 1

## 2017-02-18 MED ORDER — SODIUM CHLORIDE 0.9 % IV SOLN: INTRAVENOUS | Status: AC

## 2017-02-18 MED ORDER — MIDAZOLAM HCL 2 MG/2ML IJ SOLN
INTRAMUSCULAR | Status: DC | PRN
  Administered 2017-02-18: 1 mg via INTRAVENOUS

## 2017-02-18 MED ORDER — ONDANSETRON HCL 4 MG/2ML IJ SOLN: 4.0000 mg | Freq: Four times a day (QID) | INTRAMUSCULAR | Status: DC | PRN

## 2017-02-18 MED ORDER — HEPARIN (PORCINE) IN NACL 2-0.9 UNIT/ML-% IJ SOLN
INTRAMUSCULAR | Status: AC
  Filled 2017-02-18: qty 1000

## 2017-02-18 MED ORDER — LIDOCAINE HCL (PF) 1 % IJ SOLN
INTRAMUSCULAR | Status: AC
  Filled 2017-02-18: qty 30

## 2017-02-18 SURGICAL SUPPLY — 19 items
CATH 5FR JL3.5 JR4 ANG PIG MP (CATHETERS) ×1 IMPLANT
CATH INFINITI 5 FR AL2 (CATHETERS) ×1 IMPLANT
CATH INFINITI 5FR AL1 (CATHETERS) ×1 IMPLANT
CATH INFINITI 5FR JL4 (CATHETERS) ×1 IMPLANT
CATH LAUNCHER 5F JL3 (CATHETERS) IMPLANT
CATH LAUNCHER 5F RADL (CATHETERS) IMPLANT
CATH SWAN GANZ 7F STRAIGHT (CATHETERS) ×1 IMPLANT
CATHETER LAUNCHER 5F JL3 (CATHETERS) ×2
CATHETER LAUNCHER 5F RADL (CATHETERS) ×2
DEVICE RAD COMP TR BAND LRG (VASCULAR PRODUCTS) ×1 IMPLANT
GLIDESHEATH SLEND SS 6F .021 (SHEATH) ×1 IMPLANT
GUIDEWIRE INQWIRE 1.5J.035X260 (WIRE) IMPLANT
INQWIRE 1.5J .035X260CM (WIRE) ×4
KIT HEART LEFT (KITS) ×2 IMPLANT
PACK CARDIAC CATHETERIZATION (CUSTOM PROCEDURE TRAY) ×2 IMPLANT
SHEATH PINNACLE 7F 10CM (SHEATH) ×1 IMPLANT
SLEEVE REPOSITIONING LENGTH 30 (MISCELLANEOUS) ×1 IMPLANT
TRANSDUCER W/STOPCOCK (MISCELLANEOUS) ×2 IMPLANT
TUBING CIL FLEX 10 FLL-RA (TUBING) ×2 IMPLANT

## 2017-02-18 NOTE — Progress Notes (Signed)
I called [name] [name] yesterday to schedule an appointment. He did not answer so I left a voicemail requesting he call me back.

## 2017-02-18 NOTE — Discharge Instructions (Signed)

## 2017-02-18 NOTE — Interval H&P Note (Signed)
History and Physical Interval Note:  02/18/2017 10:15 AM  [name] [name]  has presented today for surgery, with the diagnosis of hf  The various methods of treatment have been discussed with the patient and family. After consideration of risks, benefits and other options for treatment, the patient has consented to  Procedure(s): RIGHT/LEFT HEART CATH AND CORONARY ANGIOGRAPHY (N/A)  And possible coronary angioplasty as a surgical intervention .  The patient's history has been reviewed, patient examined, no change in status, stable for surgery.  I have reviewed the patient's chart and labs.  Questions were answered to the patient's satisfaction.     Daniel Bensimhon

## 2017-02-18 NOTE — Telephone Encounter (Signed)
I called [name] [name] to schedule an appointment. He did not answer so I left a voicemail requesting he call me back.  

## 2017-02-18 NOTE — Telephone Encounter (Signed)
Mr [name] [name] returned my phone call this afternoon stating that he had a scheduled heart cath today and did not see it necessary that I come out tomorrow. He requested I call him after the holiday next week and meet either Wednesday or Thursday.

## 2017-02-19 ENCOUNTER — Encounter (HOSPITAL_COMMUNITY): Payer: Self-pay | Admitting: Internal Medicine

## 2017-02-22 NOTE — Progress Notes (Signed)
EPIC Encounter for ICM Monitoring  Patient Name: [name] [name] is a 66 y.o. male Date: 02/22/2017 Primary Care Physican: [name], NP Primary Cardiologist:[facility]/HF clinic Electrophysiologist: [name] Dry Weight: ranges 216-220 lbs Bi-V Pacing: 96%       Transmission reviewed.    Thoracic impedance normal.  Prescribed dosage: Torsemide 100 mg take 0.5 tablet (50 mg total) once a day.  Labs: 12/04/2016 Creatinine 1.32, BUN 33, Potassium 4.1, Sodium 136, EGFR 55->60 11/27/2016 Creatinine 1.36, BUN 45, Potassium 3.6, Sodium 133, EGFR 53->60 11/26/2016 Creatinine 1.38, BUN 39, Potassium 4.3, Sodium 133, EGFR 52-60  11/25/2016 Creatinine 1.26, BUN 38, Potassium 3.0, Sodium 135, EGFR 58->60  11/24/2016 Creatinine 1.33, BUN 39, Potassium 3.2, Sodium 133, EGFR 54->60  11/23/2016 Creatinine 1.38, BUN 36, Potassium 2.8, Sodium 138, EGFR 52-60  11/22/2016 Creatinine 1.48, BUN 36, Potassium 3.5, Sodium 134, EGFR 48-55  11/21/2016 Creatinine 1.37, BUN 33, Potassium 3.4, Sodium 136, EGFR 52->60  11/20/2016 Creatinine 1.44, BUN 31, Potassium 4.3, Sodium 140, EGFR 49-57  11/19/2016 Creatinine 1.31, BUN 129, Potassium 4.4, Sodium 137, EGFR 55->60  11/18/2016 Creatinine 1.38, BUN 31, Potassium 3.7, Sodium 136, EGFR 52-60  11/12/2016 Creatinine 1.49, BUN 30, Potassium 4.2, Sodium 138 06/02/2016 Creatinine 1.16, BUN 18, Potassium 4.7, Sodium 137 04/06/2016 Creatinine 0.92, BUN 19, Potassium 4.2, Sodium 138 09/25/2017Creatinine 1.31, BUN 19, Potassium 5.0, Sodium 134  Recommendations: None  Follow-up plan: ICM clinic phone appointment on 03/25/2017.  Office appointment scheduled 03/19/2017 with HF clinic NP/PA.  Copy of ICM check sent to Dr. [name].   3 month ICM trend: 02/20/2017    1 Year ICM trend:       [name], RN 02/22/2017 10:34 AM

## 2017-03-12 ENCOUNTER — Telehealth (HOSPITAL_COMMUNITY): Payer: Self-pay

## 2017-03-12 NOTE — Telephone Encounter (Signed)
I called Mr Spangler to schedule an appointment. He did not answer so I left a voicemail requesting he al me back.

## 2017-03-12 NOTE — Telephone Encounter (Signed)
[name] [name] called me back this morning and advised he was not going to be able to see me today because he was on the way to a funeral. He requested I call him Monday and set up an appointment then. He also asked if I could get Eliquis for him because it was going to be over $160 at the pharmacy and he could not afford it. I will relay this to the clinic.

## 2017-03-12 NOTE — Telephone Encounter (Signed)
Created in error

## 2017-03-16 ENCOUNTER — Telehealth (HOSPITAL_COMMUNITY): Payer: Self-pay

## 2017-03-16 NOTE — Telephone Encounter (Signed)
I called [name] [name] to schedule an appointment today. He did not answer the phone so I left a message stating that I would like to see him as soon as possible and that I have Eliquis samples for him.

## 2017-03-16 NOTE — Telephone Encounter (Signed)
I called Mr [name] [name] again to try to schedule a meeting. He did not answer and no voicemail was left.

## 2017-03-19 ENCOUNTER — Other Ambulatory Visit (HOSPITAL_COMMUNITY): Payer: Self-pay

## 2017-03-19 ENCOUNTER — Encounter (HOSPITAL_COMMUNITY): Payer: Self-pay | Admitting: Internal Medicine

## 2017-03-19 ENCOUNTER — Other Ambulatory Visit: Payer: Self-pay | Admitting: Nurse Practitioner

## 2017-03-19 ENCOUNTER — Encounter (HOSPITAL_COMMUNITY): Payer: Self-pay | Admitting: *Deleted

## 2017-03-19 ENCOUNTER — Ambulatory Visit (HOSPITAL_COMMUNITY)
Admission: RE | Admit: 2017-03-19 | Discharge: 2017-03-19 | Disposition: A | Discharge status: Home / Self Care | Payer: PPO | Source: Ambulatory Visit | Attending: Internal Medicine | Admitting: Internal Medicine

## 2017-03-19 VITALS — BP 104/63 | HR 64 | Wt 221.4 lb

## 2017-03-19 DIAGNOSIS — I130 Hypertensive heart and chronic kidney disease with heart failure and stage 1 through stage 4 chronic kidney disease, or unspecified chronic kidney disease: Secondary | ICD-10-CM | POA: Diagnosis not present

## 2017-03-19 DIAGNOSIS — I255 Ischemic cardiomyopathy: Secondary | ICD-10-CM | POA: Insufficient documentation

## 2017-03-19 DIAGNOSIS — I2510 Atherosclerotic heart disease of native coronary artery without angina pectoris: Secondary | ICD-10-CM | POA: Diagnosis not present

## 2017-03-19 DIAGNOSIS — Z794 Long term (current) use of insulin: Secondary | ICD-10-CM | POA: Diagnosis not present

## 2017-03-19 DIAGNOSIS — Z9889 Other specified postprocedural states: Secondary | ICD-10-CM | POA: Diagnosis not present

## 2017-03-19 DIAGNOSIS — Z833 Family history of diabetes mellitus: Secondary | ICD-10-CM | POA: Insufficient documentation

## 2017-03-19 DIAGNOSIS — E7800 Pure hypercholesterolemia, unspecified: Secondary | ICD-10-CM | POA: Insufficient documentation

## 2017-03-19 DIAGNOSIS — I5022 Chronic systolic (congestive) heart failure: Secondary | ICD-10-CM | POA: Diagnosis not present

## 2017-03-19 DIAGNOSIS — I5082 Biventricular heart failure: Secondary | ICD-10-CM | POA: Diagnosis not present

## 2017-03-19 DIAGNOSIS — R001 Bradycardia, unspecified: Secondary | ICD-10-CM | POA: Diagnosis not present

## 2017-03-19 DIAGNOSIS — Z8249 Family history of ischemic heart disease and other diseases of the circulatory system: Secondary | ICD-10-CM | POA: Insufficient documentation

## 2017-03-19 DIAGNOSIS — I9589 Other hypotension: Secondary | ICD-10-CM | POA: Diagnosis not present

## 2017-03-19 DIAGNOSIS — I480 Paroxysmal atrial fibrillation: Secondary | ICD-10-CM | POA: Diagnosis not present

## 2017-03-19 DIAGNOSIS — Z87891 Personal history of nicotine dependence: Secondary | ICD-10-CM | POA: Insufficient documentation

## 2017-03-19 DIAGNOSIS — Z95810 Presence of automatic (implantable) cardiac defibrillator: Secondary | ICD-10-CM | POA: Insufficient documentation

## 2017-03-19 DIAGNOSIS — I4892 Unspecified atrial flutter: Secondary | ICD-10-CM | POA: Diagnosis not present

## 2017-03-19 DIAGNOSIS — E11319 Type 2 diabetes mellitus with unspecified diabetic retinopathy without macular edema: Secondary | ICD-10-CM | POA: Diagnosis not present

## 2017-03-19 DIAGNOSIS — E1122 Type 2 diabetes mellitus with diabetic chronic kidney disease: Secondary | ICD-10-CM | POA: Insufficient documentation

## 2017-03-19 DIAGNOSIS — Z820 Family history of epilepsy and other diseases of the nervous system: Secondary | ICD-10-CM | POA: Diagnosis not present

## 2017-03-19 DIAGNOSIS — Z880 Allergy status to penicillin: Secondary | ICD-10-CM | POA: Insufficient documentation

## 2017-03-19 DIAGNOSIS — Z7902 Long term (current) use of antithrombotics/antiplatelets: Secondary | ICD-10-CM | POA: Diagnosis not present

## 2017-03-19 DIAGNOSIS — N183 Chronic kidney disease, stage 3 unspecified: Secondary | ICD-10-CM

## 2017-03-19 DIAGNOSIS — Z79899 Other long term (current) drug therapy: Secondary | ICD-10-CM | POA: Diagnosis not present

## 2017-03-19 DIAGNOSIS — E0842 Diabetes mellitus due to underlying condition with diabetic polyneuropathy: Secondary | ICD-10-CM

## 2017-03-19 DIAGNOSIS — Z87442 Personal history of urinary calculi: Secondary | ICD-10-CM | POA: Diagnosis not present

## 2017-03-19 LAB — BASIC METABOLIC PANEL
Anion gap: 11 (ref 5–15)
BUN: 30 mg/dL — ABNORMAL HIGH (ref 6–20)
CHLORIDE: 99 mmol/L — AB (ref 101–111)
CO2: 27 mmol/L (ref 22–32)
CREATININE: 1.34 mg/dL — AB (ref 0.61–1.24)
Calcium: 10.1 mg/dL (ref 8.9–10.3)
GFR calc Af Amer: >60 mL/min (ref >60)
GFR calc non Af Amer: 53 mL/min — ABNORMAL LOW (ref >60)
Glucose, Bld: 318 mg/dL — ABNORMAL HIGH (ref 65–99)
POTASSIUM: 5.2 mmol/L — AB (ref 3.5–5.1)
SODIUM: 137 mmol/L (ref 135–145)

## 2017-03-19 MED ORDER — TORSEMIDE 100 MG PO TABS: ORAL_TABLET | ORAL | 6 refills | Status: DC

## 2017-03-19 MED ORDER — DIGOXIN 125 MCG PO TABS: 0.1250 mg | ORAL_TABLET | Freq: Every day | ORAL | 3 refills | Status: DC

## 2017-03-19 NOTE — Progress Notes (Signed)
Tranplant packet faxed to [facility].  Balinda Quails RN, VAD Coordinator 24/7 pager [phone]

## 2017-03-19 NOTE — Progress Notes (Signed)
Advanced Heart Failure Clinic Note     Primary Cardiologist: Dr. [name] Primary HF: Dr. [name]   HPI: [name] [name] is a 67 y.o. 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   Admitted 11/18/16 with ADHF. Echo 11/19/16 with LVEF 15-20%, Mod MR, Mod LAE, Mild RV dilation and moderately reduced function, Moderate TR, PA peak pressure 44 mm Hg. Device interrogation showed recent bout of AF but was back in NSR. Suspect AF contributing to decompensation. Work up planned for possible transplant, RV function probably will not support LVAD. Discharge weight was 225 pounds on 11/27/16.   Here with his wife. Since last visit had R/L heart cath with 3v CAD and low filling pressures/low output. Torsemide cut back to 50mg  every other day (was daily). Says he feels pretty good. Hard to get up and get his balance but once he gets going he feels fine and can do all ADLs without problem. Walks out to take care of dogs and back without problem. 150 feet each way. If does more gets mild SOB. Drinking a lot of ice. No edema, orthopnea or PND. Weight up 3 pounds. Compliant with meds. BP this am 94/58   ICD interrogated personally: No VT/AF  fluid up activity down to 1hr/day  Studies:   CPX 12/18: Resting HR: 57 Peak HR: 67  (44% age predicted max HR) BP rest: 90/64 BP peak: 78/58 Peak VO2: 9.3 (41% predicted peak VO2) VE/VCO2 slope: 53 OUES: 1.09 Peak RER: 0.90  R/L Cath 12/18:  1) LAD diffuse 60% stenosis 2) LCX multiple 40% lesions 3) RCA chronically occluded proximally with L to R collaterals  Ao = 99/56 (73) LV = 101/5 RA = 1 RV = 29/1 PA = 23/11 (16) PCW = 6 Fick cardiac output/index = 3.8/1.7 Thermo CO/CI = 4.1/1.9 PVR = 2.6 Ao sat = 99% PA sat = 60%, 61% PaPI = 12 RA/PCWP = 0.16  Review of systems complete and found to be negative unless listed in  HPI.    Past Medical History:  Diagnosis Date  . AICD (automatic cardioverter/defibrillator) present 02/05/2014   Upgrade to Medtronic biventricular ICD, serial number  [id number]   . Atrial flutter ([place]) 04/2012   s/p TEE-EPS+RFCA 04/2012  . CAD (coronary artery disease) 0865,7846 X 2    RCA-T, 70% PL (off CFX), 99% Prox LAD/90% Dist LAD, S/P TAXUS stent x 2  . CHF (congestive heart failure) ([place])   . Chronic anticoagulation   . Chronic systolic heart failure ([place])   . CKD (chronic kidney disease)   . Diabetic retinopathy ([place])   . DM type 2 (diabetes mellitus, type 2) (HCC)    insulin dependent  . HTN (hypertension)   . Hypercholesteremia    ablation  . ICD (implantable cardiac defibrillator) in place   . Ischemic cardiomyopathy March 2015   20-25% 2D   . Nephrolithiasis   . Ventricular tachycardia ([place])     Current Outpatient Medications  Medication Sig Dispense Refill  . apixaban (ELIQUIS) 5 MG TABS tablet Take 1 tablet (5 mg total) by mouth 2 (two) times daily. 60 tablet 6  . atorvastatin (LIPITOR) 80 MG tablet TAKE ONE TABLET BY MOUTH ONCE DAILY (Patient taking differently: TAKE 80 MG BY MOUTH ONCE DAILY) 90 tablet 3  . DULoxetine (CYMBALTA) 30 MG capsule Take 30 mg  by mouth daily.    . insulin NPH-regular Human (NOVOLIN 70/30 RELION) (70-30) 100 UNIT/ML injection Inject 30 Units into the skin 2 (two) times daily with a meal. 10 mL 12  . losartan (COZAAR) 25 MG tablet Take 12.5 mg by mouth at bedtime.    . magnesium oxide (MAG-OX) 400 MG tablet Take 1 tablet (400 mg total) by mouth daily. 90 tablet 3  . metFORMIN (GLUCOPHAGE-XR) 750 MG 24 hr tablet TAKE TWO TABLETS BY MOUTH ONCE DAILY WITH BREAKFAST (Patient taking differently: Take 1,500 mg by mouth daily with breakfast. ) 180 tablet 0  . sotalol (BETAPACE) 120 MG tablet TAKE ONE TABLET BY MOUTH TWICE DAILY (Patient taking differently: TAKE 120MG  BY MOUTH TWICE DAILY) 180 tablet 3  . torsemide (DEMADEX) 100 MG tablet Take  0.5 tablets (50 mg total) daily by mouth. 30 tablet 6  . Dulaglutide (TRULICITY) 5.39 JQ/7.3AL SOPN Inject 0.75 mg into the skin once a week. (Patient not taking: Reported on 03/19/2017) 4 pen 2  . Multiple Vitamins-Minerals (MULTIVITAMIN PO) Take 1 capsule by mouth daily.     Marland Kitchen OVER THE COUNTER MEDICATION Apply 1 application topically daily. Epsom Salt Foot Gel     No current facility-administered medications for this encounter.     Allergies  Allergen Reactions  . Penicillins Hives, Itching and Other (See Comments)    Has patient had a PCN reaction causing immediate rash, facial/tongue/throat swelling, SOB or lightheadedness with hypotension: Yes Has patient had a PCN reaction causing severe rash involving mucus membranes or skin necrosis: No Has patient had a PCN reaction that required hospitalization: No Has patient had a PCN reaction occurring within the last 10 years: No If all of the above answers are "NO", then may proceed with Cephalosporin use.       Social History   Socioeconomic History  . Marital status: Married    Spouse name: Not on file  . Number of children: 4  . Years of education: 43  . Highest education level: Not on file  Social Needs  . Financial resource strain: Not on file  . Food insecurity - worry: Not on file  . Food insecurity - inability: Not on file  . Transportation needs - medical: Not on file  . Transportation needs - non-medical: Not on file  Occupational History  . Occupation: Psychiatrist - currently unemployed  Tobacco Use  . Smoking status: Former Smoker    Packs/day: 1.00    Years: 29.00    Pack years: 29.00    Types: Cigarettes    Last attempt to quit: 03/02/2000    Years since quitting: 17.0  . Smokeless tobacco: Never Used  Substance and Sexual Activity  . Alcohol use: No    Alcohol/week: 0.0 oz  . Drug use: No  . Sexual activity: Not on file  Other Topics Concern  . Not on file  Social History Narrative   Pt lives with wife  in a one story home - married for 80 years. He hunts regularly and is active, walking > 1 mile at times without difficulty.  Has 4 children.     Retired Horticulturist, commercial.  Education: some college.   Family History  Problem Relation Age of Onset  . Heart failure Father        Deceased  . Alzheimer's disease Mother        Living  . Heart attack Mother   . CAD Brother   . CAD Brother   . CAD  Brother   . CAD Brother   . Healthy Son   . Healthy Daughter   . Diabetes Brother    Vitals:   03/19/17 1011  BP: 104/63  Pulse: 64  SpO2: 97%  Weight: 221 lb 6.4 oz (100.4 kg)   Wt Readings from Last 3 Encounters:  03/19/17 221 lb 6.4 oz (100.4 kg)  02/18/17 211 lb (95.7 kg)  02/17/17 216 lb (98 kg)    PHYSICAL EXAM: General:  Walked into clinic. No resp difficulty HEENT: normal Neck: supple. no JVD. Carotids 2+ bilat; no bruits. No lymphadenopathy or thryomegaly appreciated. Cor: PMI laterally displaced. Regular rate & rhythm. No rubs, gallops or murmurs. Lungs: clear Abdomen: soft, nontender, nondistended. No hepatosplenomegaly. No bruits or masses. Good bowel sounds. Extremities: no cyanosis, clubbing, rash, edema Neuro: alert & orientedx3, cranial nerves grossly intact. moves all 4 extremities w/o difficulty. Affect pleasant   ASSESSMENT & PLAN: 1. Chronic systolic CHF: - Echo 8/93/73 EF 15-20% biventricular Failure with moderate RV dysfunction (likely combined ICM/NICM). He has Medtronic BiV ICD in place.  - Cath 12/18 with stable 1v CAD. Low filling pressures with CI 1.7  - CPX testing 12/18 with submax test but severe HF limitation with exertional hypotension. Peak VO2: 9.3 (41% predicted peak VO2). VE/VCO2 slope: 53 - Remains NYHA III-IIIb. Volume status going back up after torsemide cut back. Optivol suggest severely elevated but recent cath and ReDS less impressive - ICD interrogated personally. No VT/AF. Volume status up. Activity level down 1-2 hr/day - ReDS vest 37% -  Increase torsemide from 50mg  qOD to 50mg  5 days per week (hold wed and sun) - Continue losartan 12.5. BP too low to titrate - off b-blocker due to low output and low BP and bradycardia - start digoxin 0.125 - On sotalol for VT. Continue for now.  - Long discussion about role of advanced therapies. He is interested. Will refer to [facility] for transplant eval and also begin discussions with VAD coordinators  2. [name] - recent cath with stable CAD as above - no s/s ischemia. Off ASA with apixaban. Continue statin   3. [name] - Per PCP.  - Consider Jardiance - Will need to adequate control to permit advanced therapies.  5. CKD III - Check BMET today  6. [name] - s/p ICD - Continue sotalol.  - ICD interrogated personally. No shocks  7. PAF s/p ablation 04/2012 - In NSR today.     - Continue Eliquis.  - Maintaining NSR.   Total time spent 45 minutes. Over half that time spent discussing above.    Glori Bickers, MD 03/19/17

## 2017-03-19 NOTE — Patient Instructions (Addendum)
Labs today (will call for abnormal results, otherwise no news is good news)  START taking digoxin 0.125 mg (1 Tablet) Once Daily  START taking torsemide 50 mg (0.5 Tablet) only on Monday, Tuesday, Thursday, Friday, and Saturdays.   Consult with our VAD coordinators has been ordered for you.    Follow up in 1 Month

## 2017-03-19 NOTE — Progress Notes (Signed)
Paramedicine Encounter   Patient ID: [name] [name] , male,   DOB: [birth year],67 y.o.,  MRN: [id number]   [name] [name] was seen in the clinic today and reported feeling generally well. His device report showed an increase in fluid retention. He stated he is able to walk 300' without SOB on flat ground. At last visit he had a reduction in torsemide to every other day. Dr [name] advised this could be the cause for fluid increase. Dr [name] introduced the idea of transplant or LVAD due to his weakening heart and low tolerance for medications.   Dr [name] is adding digoxin and increasing torsemide.   ReDs reading- 37%   Time spent with patient: 55 minutes  Jacquiline Doe, EMT 03/19/2017   ACTION: Next visit planned for 1 week

## 2017-03-25 ENCOUNTER — Ambulatory Visit (INDEPENDENT_AMBULATORY_CARE_PROVIDER_SITE_OTHER): Payer: PPO

## 2017-03-25 ENCOUNTER — Telehealth (HOSPITAL_COMMUNITY): Payer: Self-pay

## 2017-03-25 ENCOUNTER — Telehealth: Payer: Self-pay | Admitting: Cardiology

## 2017-03-25 DIAGNOSIS — Z95810 Presence of automatic (implantable) cardiac defibrillator: Secondary | ICD-10-CM | POA: Diagnosis not present

## 2017-03-25 DIAGNOSIS — I5022 Chronic systolic (congestive) heart failure: Secondary | ICD-10-CM

## 2017-03-25 NOTE — Telephone Encounter (Signed)
Mr Kluth called me today to schedule our appointment tomorrow. He stated that 11:00 would work for him and I agreed.

## 2017-03-25 NOTE — Telephone Encounter (Signed)
LMOVM reminding pt to send remote transmission.   

## 2017-03-26 ENCOUNTER — Encounter (HOSPITAL_COMMUNITY): Payer: Self-pay | Admitting: Emergency Medicine

## 2017-03-26 ENCOUNTER — Other Ambulatory Visit: Payer: Self-pay

## 2017-03-26 ENCOUNTER — Other Ambulatory Visit (HOSPITAL_COMMUNITY): Payer: Self-pay

## 2017-03-26 ENCOUNTER — Ambulatory Visit (INDEPENDENT_AMBULATORY_CARE_PROVIDER_SITE_OTHER): Payer: PPO

## 2017-03-26 ENCOUNTER — Ambulatory Visit (HOSPITAL_COMMUNITY)
Admission: EM | Admit: 2017-03-26 | Discharge: 2017-03-26 | Disposition: A | Discharge status: Home / Self Care | Payer: PPO | Attending: Family Medicine | Admitting: Family Medicine

## 2017-03-26 ENCOUNTER — Ambulatory Visit: Payer: PPO | Admitting: Neurology

## 2017-03-26 DIAGNOSIS — S8261XA Displaced fracture of lateral malleolus of right fibula, initial encounter for closed fracture: Secondary | ICD-10-CM

## 2017-03-26 DIAGNOSIS — S8264XA Nondisplaced fracture of lateral malleolus of right fibula, initial encounter for closed fracture: Secondary | ICD-10-CM | POA: Diagnosis not present

## 2017-03-26 DIAGNOSIS — M25571 Pain in right ankle and joints of right foot: Secondary | ICD-10-CM | POA: Diagnosis not present

## 2017-03-26 NOTE — ED Triage Notes (Signed)
Pt reports falling in the middle of the night and injuring his right ankle and foot.  He does not know how he fell or how he may have injured the foot.

## 2017-03-26 NOTE — Progress Notes (Signed)
EPIC Encounter for ICM Monitoring  Patient Name: [name] [name] is a 67 y.o. male Date: 03/26/2017 Primary Care Physican: [name], NP Primary Cardiologist:[facility]/HF clinic Electrophysiologist: [name] Dry Weight: 220 lbs (ranges 216-220 lbs) Bi-V Pacing: 97.3%          Heart Failure questions reviewed, pt asymptomatic.  He said he sprained his ankle and going to physician for follow up.     Thoracic impedance normal since 03/18/17.  Prescribed dosage: Torsemide 100 mg take 50 mg Once Daily only on Monday, Tuesday, Thursday, Friday, and Saturdays.  Labs: 03/19/2017 Creatinine 1.34, BUN 30, Potassium 5.2, Sodium 137, EGFR 53->60 02/17/2017 Creatinine 1.44, BUN 22, Potassium 4.5, Sodium 128, EGFR 49-57  02/05/2017 Creatinine 1.37, BUN 29, Potassium 4.9, Sodium 134, EGFR 52->60  01/27/2017 Creatinine 1.47, BUN 44, Potassium 4.7, Sodium 132, EGFR 48-56  01/15/2017 Creatinine 1.13, BUN 25, Potassium 4.7, Sodium 131, EGFR >60  12/04/2016 Creatinine 1.32, BUN 33, Potassium 4.1, Sodium 136, EGFR 55->60 11/27/2016 Creatinine 1.36, BUN 45, Potassium 3.6, Sodium 133, EGFR 53->60 11/26/2016 Creatinine 1.38, BUN 39, Potassium 4.3, Sodium 133, EGFR 52-60  11/25/2016 Creatinine 1.26, BUN 38, Potassium 3.0, Sodium 135, EGFR 58->60  11/24/2016 Creatinine 1.33, BUN 39, Potassium 3.2, Sodium 133, EGFR 54->60  11/23/2016 Creatinine 1.38, BUN 36, Potassium 2.8, Sodium 138, EGFR 52-60  11/22/2016 Creatinine 1.48, BUN 36, Potassium 3.5, Sodium 134, EGFR 48-55  11/21/2016 Creatinine 1.37, BUN 33, Potassium 3.4, Sodium 136, EGFR 52->60  11/20/2016 Creatinine 1.44, BUN 31, Potassium 4.3, Sodium 140, EGFR 49-57  11/19/2016 Creatinine 1.31, BUN 129, Potassium 4.4, Sodium 137, EGFR 55->60  11/18/2016 Creatinine 1.38, BUN 31, Potassium 3.7, Sodium 136, EGFR 52-60  11/12/2016 Creatinine 1.49, BUN 30, Potassium 4.2, Sodium 138 06/02/2016 Creatinine 1.16, BUN 18, Potassium 4.7, Sodium  137 04/06/2016 Creatinine 0.92, BUN 19, Potassium 4.2, Sodium 138 09/25/2017Creatinine 1.31, BUN 19, Potassium 5.0, Sodium 134  Recommendations: No changes.   Encouraged to call for fluid symptoms.  Follow-up plan: ICM clinic phone appointment on 04/26/2017.  Office appointment scheduled 04/09/2017 with [facility] and 04/21/2017 with Dr. [name].  Copy of ICM check sent to Dr. [name].   3 month ICM trend: 03/26/2017    1 Year ICM trend:       [name], RN 03/26/2017 7:51 AM

## 2017-03-26 NOTE — ED Provider Notes (Signed)
[place]    CSN: [id number] Arrival date & time: 03/26/17  1243     History   Chief Complaint Chief Complaint  Patient presents with  . Leg Injury    right    HPI [name] [name] is a 67 y.o. male.   Sina presents with family with complaints of right ankle pain after a fall 1/23. He states he feels he was walking to the bathroom and was half asleep, he woke and was on the floor. Denies head injury, neck pain, dizziness, light headedness, skin tear/laceration. States feels fine except for his right ankle. Pain with weight bearing. Elevation and ice has helped. Has not taken any medications for his symptoms. Rates pain 7/10. States injured it in the past many years ago. He does take eliquis, digoxin , trulicity, torsemide, insulin. Extensive medical and cardiac history with aicd, cad, chf, diabetes. Has an appointment with his PCP on 1/28. Denies chest pain, shortness of breath.    ROS per HPI.       Past Medical History:  Diagnosis Date  . AICD (automatic cardioverter/defibrillator) present 02/05/2014   Upgrade to Medtronic biventricular ICD, serial number  [id number]   . Atrial flutter ([place]) 04/2012   s/p TEE-EPS+RFCA 04/2012  . CAD (coronary artery disease) 2706,2376 X 2    RCA-T, 70% PL (off CFX), 99% Prox LAD/90% Dist LAD, S/P TAXUS stent x 2  . CHF (congestive heart failure) ([place])   . Chronic anticoagulation   . Chronic systolic heart failure ([place])   . CKD (chronic kidney disease)   . Diabetic retinopathy ([place])   . DM type 2 (diabetes mellitus, type 2) (H[facility])    insulin dependent  . HTN (hypertension)   . Hypercholesteremia    ablation  . ICD (implantable cardiac defibrillator) in place   . Ischemic cardiomyopathy March 2015   20-25% 2D   . Nephrolithiasis   . Ventricular tachycardia [facility])     Patient Active Problem List   Diagnosis Date Noted  . Acute on chronic combined systolic and diastolic CHF (congestive heart failure) ([place])  11/19/2016  . Acute on chronic left and right systolic w/ diastolic heart failure, NYHA class 2 ([place]) 11/18/2016  . Hx of atrial flutter 11/18/2016  . CAD (coronary artery disease)-RCA-T, 70% PL (off CFX), 99% Prox LAD/90% Dist LAD, S/P TAXUS stent x 2 11/18/2016  . HTN (hypertension) 11/18/2016  . Diabetes mellitus with complication ([place]) 28/31/5176  . Thrombocytopenia ([place]) 11/18/2016  . Bilateral lower extremity edema 11/12/2016  . Diabetic polyneuropathy associated with diabetes mellitus due to underlying condition ([place]) 05/29/2015  . Chronic systolic heart failure ([place]) 02/05/2014  . Medtronic biventricular ICD, serial number  BLD [id number]  02/05/2014  . CAD (coronary artery disease), native coronary artery 01/08/2014  . Chronic renal disease, stage III ([facility]) 01/08/2014  . Heart failure ([place]) 12/16/2013  . Mitral regurgitation 04/17/2013  . Chronic systolic dysfunction of left ventricle 06/29/2012  . Atrial flutter ([place]) 06/03/2012  . Acute kidney injury (nontraumatic) ([place]) 05/31/2012  . Chronic anticoagulation   . Acute on chronic systolic heart failure ([place]) 05/21/2012  . Nausea with vomiting 05/18/2012  . PAF (paroxysmal atrial fibrillation) ([place]) 05/17/2012  . Orthopnea 05/17/2012  . TIA (transient ischemic attack) 05/29/2010  . CHEST PAIN 11/13/2008  . VENTRICULAR TACHYCARDIA 08/14/2008  . Automatic implantable cardioverter-defibrillator in situ 08/14/2008  . Type 2 diabetes with nephropathy ([place]) 08/11/2008  . DYSLIPIDEMIA 08/11/2008  . Cardiomyopathy, ischemic 08/11/2008  . Disorder resulting  from impaired renal function 08/11/2008  . HYPERCHOLESTEROLEMIA 04/29/2006  . HYPERTENSION, BENIGN SYSTEMIC 04/29/2006  . MYOCARDIAL INFARCTION, OLD 04/29/2006  . Coronary atherosclerosis 04/29/2006  . NEPHROLITHIASIS 04/29/2006    Past Surgical History:  Procedure Laterality Date  . ATRIAL FLUTTER ABLATION N/A 05/19/2012   Procedure: ATRIAL FLUTTER ABLATION;  Surgeon: Thompson Grayer, MD;  Location: [facility] CATH LAB;  Service: Cardiovascular;  Laterality: N/A;  . BI-VENTRICULAR IMPLANTABLE CARDIOVERTER DEFIBRILLATOR UPGRADE N/A 02/05/2014   Procedure: BI-VENTRICULAR IMPLANTABLE CARDIOVERTER DEFIBRILLATOR UPGRADE;  Surgeon: Evans Lance, MD;  Location: [facility] CATH LAB;  Service: Cardiovascular;  Laterality: N/A;  . BIV ICD GENERTAOR CHANGE OUT  02/05/2014   Upgrade to Medtronic biventricular ICD, serial number  [id number] by Dr. [name]  . CARDIAC DEFIBRILLATOR PLACEMENT  2007    Medtronic Maximo VR, serial number [id number]  . PERCUTANEOUS CORONARY STENT INTERVENTION (PCI-S)  January 2002   PTCA/Stent Distal RCA  . PERCUTANEOUS CORONARY STENT INTERVENTION (PCI-S)  June 2002   PTCA/Stent x 3 RCA, thrombolysis - failed  . PERCUTANEOUS CORONARY STENT INTERVENTION (PCI-S)  July 2006   TAXUS stents to prox and distal LAD  . RIGHT/LEFT HEART CATH AND CORONARY ANGIOGRAPHY N/A 02/18/2017   Procedure: RIGHT/LEFT HEART CATH AND CORONARY ANGIOGRAPHY;  Surgeon: Jolaine Artist, MD;  Location: [facility] CV LAB;  Service: Cardiovascular;  Laterality: N/A;       Home Medications    Prior to Admission medications   Medication Sig Start Date End Date Taking? Authorizing Provider  apixaban (ELIQUIS) 5 MG TABS tablet Take 1 tablet (5 mg total) by mouth 2 (two) times daily. 04/09/15  Yes Lelon Perla, MD  atorvastatin (LIPITOR) 80 MG tablet TAKE ONE TABLET BY MOUTH ONCE DAILY Patient taking differently: TAKE 80 MG BY MOUTH ONCE DAILY 07/31/16  Yes Lelon Perla, MD  digoxin (LANOXIN) 0.125 MG tablet Take 1 tablet (0.125 mg total) by mouth daily. 03/19/17  Yes Bensimhon, Shaune Pascal, MD  DULoxetine (CYMBALTA) 30 MG capsule Take 30 mg by mouth daily. 02/01/17  Yes [provider]  insulin NPH-regular Human (NOVOLIN 70/30 RELION) (70-30) 100 UNIT/ML injection Inject 30 Units into the skin 2 (two) times daily with a meal. 12/26/15  Yes Calone, Ples Specter, FNP  losartan  (COZAAR) 25 MG tablet Take 12.5 mg by mouth at bedtime.   Yes [provider]  magnesium oxide (MAG-OX) 400 MG tablet Take 1 tablet (400 mg total) by mouth daily. 06/01/12  Yes Weaver, Scott T, PA-C  metFORMIN (GLUCOPHAGE-XR) 750 MG 24 hr tablet TAKE TWO TABLETS BY MOUTH ONCE DAILY WITH BREAKFAST Patient taking differently: Take 1,500 mg by mouth daily with breakfast.  01/07/17  Yes Shambley, Delphia Grates, NP  Multiple Vitamins-Minerals (MULTIVITAMIN PO) Take 1 capsule by mouth daily.    Yes [provider]  sotalol (BETAPACE) 120 MG tablet TAKE ONE TABLET BY MOUTH TWICE DAILY Patient taking differently: TAKE 120MG  BY MOUTH TWICE DAILY 07/31/16  Yes Lelon Perla, MD  torsemide [facility]) 100 MG tablet Take 50 mg Once Daily only on Monday, Tuesday, Thursday, Friday, and Saturdays. 03/19/17  Yes Bensimhon, Shaune Pascal, MD  Dulaglutide (TRULICITY) 0.45 WU/9.8JX SOPN Inject 0.75 mg into the skin once a week. Patient not taking: Reported on 03/19/2017 06/25/16   [place], FNP  OVER THE COUNTER MEDICATION Apply 1 application topically daily. Epsom Salt Foot Gel    [provider]    Family History Family History  Problem Relation Age of Onset  .  Heart failure Father        Deceased  . Alzheimer's disease Mother        Living  . Heart attack Mother   . CAD Brother   . CAD Brother   . CAD Brother   . CAD Brother   . Healthy Son   . Healthy Daughter   . Diabetes Brother     Social History Social History   Tobacco Use  . Smoking status: Former Smoker    Packs/day: 1.00    Years: 29.00    Pack years: 29.00    Types: Cigarettes    Last attempt to quit: 03/02/2000    Years since quitting: 17.0  . Smokeless tobacco: Never Used  Substance Use Topics  . Alcohol use: No    Alcohol/week: 0.0 oz  . Drug use: No     Allergies   Penicillins   Review of Systems Review of Systems   Physical Exam Triage Vital Signs ED Triage Vitals  Enc Vitals Group      BP 03/26/17 1339 (!) 88/52     Pulse Rate 03/26/17 1339 (!) 58     Resp --      Temp 03/26/17 1339 97.6 F (36.4 C)     Temp Source 03/26/17 1339 Oral     SpO2 03/26/17 1339 99 %     Weight --      Height --      Head Circumference --      Peak Flow --      Pain Score 03/26/17 1336 8     Pain Loc --      Pain Edu? --      Excl. in [place]? --    No data found.  Updated Vital Signs BP (!) 88/52 (BP Location: Left Arm)   Pulse (!) 58   Temp 97.6 F (36.4 C) (Oral)   SpO2 99%   Visual Acuity Right Eye Distance:   Left Eye Distance:   Bilateral Distance:    Right Eye Near:   Left Eye Near:    Bilateral Near:     Physical Exam  Constitutional: He is oriented to person, place, and time. He appears well-developed and well-nourished.  Pulmonary/Chest: Effort normal and breath sounds normal.  Musculoskeletal:       Right ankle: He exhibits decreased range of motion and swelling. He exhibits no deformity, no laceration and normal pulse. Tenderness. Lateral malleolus tenderness found.       Feet:  +1-2 pitting edema to right ankle with tenderness to lateral ankle and proximal foot; sensation intact, cap refill < 2sec, moves toes without difficulty; pain with ankle ROM   Neurological: He is alert and oriented to person, place, and time.  Skin: Skin is warm and dry.     UC Treatments / Results  Labs (all labs ordered are listed, but only abnormal results are displayed) Labs Reviewed - No data to display  EKG  EKG Interpretation None       Radiology Dg Ankle Complete Right  Result Date: 03/26/2017 CLINICAL DATA:  Right ankle pain and swelling since possible fall 2 days ago. Initial encounter. EXAM: RIGHT ANKLE - COMPLETE 3+ VIEW COMPARISON:  None. FINDINGS: The patient has an acute nondisplaced fracture of the lateral malleolus. No other acute bony or joint abnormality is identified. Soft tissue swelling about the ankle is worst on the lateral side. IMPRESSION: Acute  lateral malleolus fracture. Electronically Signed   By: Inge Rise M.D.  On: 03/26/2017 14:30    Procedures Procedures (including critical care time)  Medications Ordered in UC Medications - No data to display   Initial Impression / Assessment and Plan / UC Course  I have reviewed the triage vital signs and the nursing notes.  Pertinent labs & imaging results that were available during my care of the patient were reviewed by me and considered in my medical decision making (see chart for details).     Denies any other complaints at this time besides ankle pain. Concern for syncopal episode to cause fall. Patient with PCP appointment on Monday. Lateral malleolus fracture on xray, consistent with findings. Non weight bearing, use of crutches, boot applied. Follow up with ortho for definitive treatment. Tylenol, ice, elevate. Patient has been tolerating pain and ambulating for the past 2 days, is on eliquis. Lower bp and likely syncopal episode with hold off on opiate for pain control at this time.   Final Clinical Impressions(s) / UC Diagnoses   Final diagnoses:  Closed nondisplaced fracture of lateral malleolus of right fibula, initial encounter    ED Discharge Orders    None       Controlled Substance Prescriptions [place] Controlled Substance Registry consulted? Not Applicable   Zigmund Gottron, NP 03/26/17 [phone]

## 2017-03-26 NOTE — Progress Notes (Signed)
Paramedicine Encounter    Patient ID: [name] [name], male    DOB: September 23, 1950, 67 y.o.   MRN: [id number]   Patient Care Team: [name] Sell, NP as PCP - General (Nurse Practitioner)  Patient Active Problem List   Diagnosis Date Noted  . Acute on chronic combined systolic and diastolic CHF (congestive heart failure) ([place]) 11/19/2016  . Acute on chronic left and right systolic w/ diastolic heart failure, NYHA class 2 ([place]) 11/18/2016  . Hx of atrial flutter 11/18/2016  . CAD (coronary artery disease)-RCA-T, 70% PL (off CFX), 99% Prox LAD/90% Dist LAD, S/P TAXUS stent x 2 11/18/2016  . HTN (hypertension) 11/18/2016  . Diabetes mellitus with complication ([place]) 38/18/2993  . Thrombocytopenia ([name]) 11/18/2016  . Bilateral lower extremity edema 11/12/2016  . Diabetic polyneuropathy associated with diabetes mellitus due to underlying condition ([place]) 05/29/2015  . Chronic systolic heart failure ([place]) 02/05/2014  . Medtronic biventricular ICD, serial number  BLD [id number]  02/05/2014  . CAD (coronary artery disease), native coronary artery 01/08/2014  . Chronic renal disease, stage III ([place]) 01/08/2014  . Heart failure ([place]) 12/16/2013  . Mitral regurgitation 04/17/2013  . Chronic systolic dysfunction of left ventricle 06/29/2012  . Atrial flutter ([place]) 06/03/2012  . Acute kidney injury (nontraumatic) ([place]) 05/31/2012  . Chronic anticoagulation   . Acute on chronic systolic heart failure ([place]) 05/21/2012  . Nausea with vomiting 05/18/2012  . PAF (paroxysmal atrial fibrillation) ([place]) 05/17/2012  . Orthopnea 05/17/2012  . TIA (transient ischemic attack) 05/29/2010  . CHEST PAIN 11/13/2008  . VENTRICULAR TACHYCARDIA 08/14/2008  . Automatic implantable cardioverter-defibrillator in situ 08/14/2008  . Type 2 diabetes with nephropathy ([place]) 08/11/2008  . DYSLIPIDEMIA 08/11/2008  . Cardiomyopathy, ischemic 08/11/2008  . Disorder resulting from impaired renal function 08/11/2008  .  HYPERCHOLESTEROLEMIA 04/29/2006  . HYPERTENSION, BENIGN SYSTEMIC 04/29/2006  . MYOCARDIAL INFARCTION, OLD 04/29/2006  . Coronary atherosclerosis 04/29/2006  . NEPHROLITHIASIS 04/29/2006    Current Outpatient Medications:  .  apixaban (ELIQUIS) 5 MG TABS tablet, Take 1 tablet (5 mg total) by mouth 2 (two) times daily., Disp: 60 tablet, Rfl: 6 .  atorvastatin (LIPITOR) 80 MG tablet, TAKE ONE TABLET BY MOUTH ONCE DAILY (Patient taking differently: TAKE 80 MG BY MOUTH ONCE DAILY), Disp: 90 tablet, Rfl: 3 .  digoxin (LANOXIN) 0.125 MG tablet, Take 1 tablet (0.125 mg total) by mouth daily., Disp: 90 tablet, Rfl: 3 .  DULoxetine (CYMBALTA) 30 MG capsule, Take 30 mg by mouth daily., Disp: , Rfl:  .  insulin NPH-regular Human (NOVOLIN 70/30 RELION) (70-30) 100 UNIT/ML injection, Inject 30 Units into the skin 2 (two) times daily with a meal., Disp: 10 mL, Rfl: 12 .  losartan (COZAAR) 25 MG tablet, Take 12.5 mg by mouth at bedtime., Disp: , Rfl:  .  magnesium oxide (MAG-OX) 400 MG tablet, Take 1 tablet (400 mg total) by mouth daily., Disp: 90 tablet, Rfl: 3 .  metFORMIN (GLUCOPHAGE-XR) 750 MG 24 hr tablet, TAKE TWO TABLETS BY MOUTH ONCE DAILY WITH BREAKFAST (Patient taking differently: Take 1,500 mg by mouth daily with breakfast. ), Disp: 180 tablet, Rfl: 0 .  Multiple Vitamins-Minerals (MULTIVITAMIN PO), Take 1 capsule by mouth daily. , Disp: , Rfl:  .  OVER THE COUNTER MEDICATION, Apply 1 application topically daily. Epsom Salt Foot Gel, Disp: , Rfl:  .  sotalol (BETAPACE) 120 MG tablet, TAKE ONE TABLET BY MOUTH TWICE DAILY (Patient taking differently: TAKE 120MG  BY MOUTH TWICE DAILY), Disp: 180 tablet, Rfl: 3 .  torsemide (DEMADEX) 100 MG tablet, Take 50 mg Once Daily only on Monday, Tuesday, Thursday, Friday, and Saturdays., Disp: 30 tablet, Rfl: 6 .  Dulaglutide (TRULICITY) 0.09 FG/1.8EX SOPN, Inject 0.75 mg into the skin once a week. (Patient not taking: Reported on 03/19/2017), Disp: 4 pen, Rfl:  2 Allergies  Allergen Reactions  . Penicillins Hives, Itching and Other (See Comments)    Has patient had a PCN reaction causing immediate rash, facial/tongue/throat swelling, SOB or lightheadedness with hypotension: Yes Has patient had a PCN reaction causing severe rash involving mucus membranes or skin necrosis: No Has patient had a PCN reaction that required hospitalization: No Has patient had a PCN reaction occurring within the last 10 years: No If all of the above answers are "NO", then may proceed with Cephalosporin use.       Social History   Socioeconomic History  . Marital status: Married    Spouse name: Not on file  . Number of children: 4  . Years of education: 60  . Highest education level: Not on file  Social Needs  . Financial resource strain: Not on file  . Food insecurity - worry: Not on file  . Food insecurity - inability: Not on file  . Transportation needs - medical: Not on file  . Transportation needs - non-medical: Not on file  Occupational History  . Occupation: Psychiatrist - currently unemployed  Tobacco Use  . Smoking status: Former Smoker    Packs/day: 1.00    Years: 29.00    Pack years: 29.00    Types: Cigarettes    Last attempt to quit: 03/02/2000    Years since quitting: 17.0  . Smokeless tobacco: Never Used  Substance and Sexual Activity  . Alcohol use: No    Alcohol/week: 0.0 oz  . Drug use: No  . Sexual activity: Not on file  Other Topics Concern  . Not on file  Social History Narrative   Pt lives with wife in a one story home - married for 21 years. He hunts regularly and is active, walking > 1 mile at times without difficulty.  Has 4 children.     Retired Horticulturist, commercial.  Education: some college.    Physical Exam  Constitutional: He is oriented to person, place, and time.  Cardiovascular: Normal rate and regular rhythm.  Pulmonary/Chest: Effort normal and breath sounds normal. No respiratory distress. He has no wheezes. He has no  rales.  Abdominal: Soft. He exhibits no distension.  Musculoskeletal: Normal range of motion. He exhibits edema, tenderness and deformity.  Neurological: He is alert and oriented to person, place, and time.  Skin: Skin is warm and dry.  Psychiatric: He has a normal mood and affect.    SAFE - 02/12/17 1000      Situation   Admitting diagnosis  chf    Heart failure history  Exisiting    Comorbidities  DM;HTN    Readmitted within 30 days  No    Hospital admission within past 12 months  Yes    number of hospital admissions  1    number of ED visits  1      Assessment   Lives alone  No    Primary support person  wife    Mode of transportation  personal car    Home equipement  Scale      Weight   Weighs self daily  Yes    Scale provided  No    Records on weight chart  No      Resources   Has "Living better w/heart failure" book  Yes    Has HF Zone tool  Yes    Able to identify yellow zone signs/when to call MD  Yes    Records zone daily  Yes      Medications   Uses a pill box  Yes    Who stocks the pill box  self    Pill box checked this visit  Yes    Pill box refilled this visit  No    Difficulty obtaining medications  No    Mail order medications  No    Missed one or more doses of medications per week  No      Nutrition   Patient receives meals on wheels  No    Patient follows low sodium diet  Yes    Has foods at home that meet the current recommended diet  Yes    Patient follows low sugar/card diet  Yes    Nutritional concerns/issues  No      Activity Level   ADL's/Mobility  Independent    How many feet can patient ambulate  150    Typical activity level  active    Barriers  hastened pace      Urine   Difficulty urinating  No    Changes in urine  None      Time spent with patient   Time spent with patient   45 Minutes         Future Appointments  Date Time Provider [place]  03/29/2017  8:00 AM [name] Sell, NP [facility]   04/09/2017 10:00 AM [facility] None  04/14/2017 11:30 AM Narda Amber K, DO [facility] None  04/21/2017 12:00 PM Bensimhon, Shaune Pascal, MD [facility] None  04/26/2017  3:20 PM [facility] DEVICE REMOTES [facility]    BP (!) 78/50 (BP Location: Left Arm, Patient Position: Sitting, Cuff Size: Large)   Pulse 61   Resp 16   Wt 214 lb 9.6 oz (97.3 kg)   SpO2 97%   BMI 29.93 kg/m   Weight yesterday- 215.2 lb Last visit weight- N/A CBG- 247 mg/dL  Mr Chien was seen at home today and reported feeling generally well. He denied SOB, headache, dizziness or orthopnea. He did complain of left ankle pain and swelling for the past two days. He stated he thought he may have fallen while sleep walking because he could not recall an injury occurring but woke up with swelling and pain on Wednesday morning. I recommended he go to urgent care for an x-ray today when I left and he agreed. His medications were verified and he was found to be hypotensive during the physical exam. He was asymptomatic of same but clinic was contacted for precaution. No changes were made at this time.   Time spent with patient: 38 minutes  Jacquiline Doe, EMT 03/26/17  ACTION: Home visit completed Next visit planned for 1 week

## 2017-03-26 NOTE — Discharge Instructions (Signed)
Ice, elevation for pain control. Non weight bearing, use of crutches. Keep boot on at all times. Use of crutches.  Please call and make follow up appointment with orthopedics for further evaluation and treatment for next week.  Tylenol as needed for pain.  Please keep appointment with your primary care provider on Monday as your blood pressure is slightly lower than usual for you and you fell without known cause.

## 2017-03-29 ENCOUNTER — Other Ambulatory Visit (INDEPENDENT_AMBULATORY_CARE_PROVIDER_SITE_OTHER): Payer: PPO

## 2017-03-29 ENCOUNTER — Encounter: Payer: Self-pay | Admitting: Nurse Practitioner

## 2017-03-29 ENCOUNTER — Ambulatory Visit: Payer: PPO | Admitting: Nurse Practitioner

## 2017-03-29 VITALS — BP 108/60 | HR 54 | Temp 98.0°F | Resp 16 | Ht 71.0 in

## 2017-03-29 DIAGNOSIS — E1121 Type 2 diabetes mellitus with diabetic nephropathy: Secondary | ICD-10-CM

## 2017-03-29 LAB — BASIC METABOLIC PANEL
BUN: 21 mg/dL (ref 6–23)
CO2: 26 mEq/L (ref 19–32)
Calcium: 9.2 mg/dL (ref 8.4–10.5)
Chloride: 102 mEq/L (ref 96–112)
Creatinine, Ser: 1.01 mg/dL (ref 0.40–1.50)
GFR: 94.75 mL/min (ref >60.00)
Glucose, Bld: 194 mg/dL — ABNORMAL HIGH (ref 70–99)
POTASSIUM: 4.1 meq/L (ref 3.5–5.1)
SODIUM: 137 meq/L (ref 135–145)

## 2017-03-29 LAB — HEMOGLOBIN A1C: Hgb A1c MFr Bld: 11.7 % — ABNORMAL HIGH (ref 4.6–6.5)

## 2017-03-29 NOTE — Progress Notes (Signed)
Name: [name] [name]   MRN: [id number]    DOB: 1950/11/04   Date:03/29/2017       Progress Note  Subjective  Chief Complaint  Chief Complaint  Patient presents with  . Follow-up    diabetes    HPI   [name] [name] presents today for a diabetes follow up.  He is followed by neurology for diabetic polyneuropathy and neuropathic pain, cardiology for chronic systolic heart failure, coronary artery disease, essential hypertension, paroxysmal atrial fibrillation, and and AICD.  He was last seen by me October with an A1c of 8.0. He was maintained on metformin 1500 mg daily, NPH 70-30 30 units twice daily. He had stopped taking his trulicity, so we decided to restart trulicity at 0.75mg  once a week.  He says he restarted his trulicity for about a month but then ran out and did not request a refill. He has been taking metformin daily without any missed doses He stopped his NPH about 2 weeks ago because "he has had too many other things going on". He recently had a heart catheterization and fractured his right fibula. He reports blood sugar readings about two times a week at home ranging from 130-220 over past month. He reports he feels thirsty often. He denies any confusion, tremor, diaphoresis, polyuria, polyphagia. He overall feels well today.  Lab Results  Component Value Date   HGBA1C 8.0 12/25/2016    Patient Active Problem List   Diagnosis Date Noted  . Acute on chronic combined systolic and diastolic CHF (congestive heart failure) ([place]) 11/19/2016  . Acute on chronic left and right systolic w/ diastolic heart failure, NYHA class 2 ([place]) 11/18/2016  . Hx of atrial flutter 11/18/2016  . CAD (coronary artery disease)-RCA-T, 70% PL (off CFX), 99% Prox LAD/90% Dist LAD, S/P TAXUS stent x 2 11/18/2016  . HTN (hypertension) 11/18/2016  . Diabetes mellitus with complication ([place]) 55/73/2202  . Thrombocytopenia ([place]) 11/18/2016  . Bilateral lower extremity edema 11/12/2016  . Diabetic  polyneuropathy associated with diabetes mellitus due to underlying condition ([place]) 05/29/2015  . Chronic systolic heart failure ([place]) 02/05/2014  . Medtronic biventricular ICD, serial number  BLD [id number]  02/05/2014  . CAD (coronary artery disease), native coronary artery 01/08/2014  . Chronic renal disease, stage III ([place]) 01/08/2014  . Heart failure ([place]) 12/16/2013  . Mitral regurgitation 04/17/2013  . Chronic systolic dysfunction of left ventricle 06/29/2012  . Atrial flutter ([employer]) 06/03/2012  . Acute kidney injury (nontraumatic) ([place]) 05/31/2012  . Chronic anticoagulation   . Acute on chronic systolic heart failure ([place]) 05/21/2012  . Nausea with vomiting 05/18/2012  . PAF (paroxysmal atrial fibrillation) ([place]) 05/17/2012  . Orthopnea 05/17/2012  . TIA (transient ischemic attack) 05/29/2010  . CHEST PAIN 11/13/2008  . VENTRICULAR TACHYCARDIA 08/14/2008  . Automatic implantable cardioverter-defibrillator in situ 08/14/2008  . Type 2 diabetes with nephropathy ([place]) 08/11/2008  . DYSLIPIDEMIA 08/11/2008  . Cardiomyopathy, ischemic 08/11/2008  . Disorder resulting from impaired renal function 08/11/2008  . HYPERCHOLESTEROLEMIA 04/29/2006  . HYPERTENSION, BENIGN SYSTEMIC 04/29/2006  . MYOCARDIAL INFARCTION, OLD 04/29/2006  . Coronary atherosclerosis 04/29/2006  . NEPHROLITHIASIS 04/29/2006    Past Surgical History:  Procedure Laterality Date  . ATRIAL FLUTTER ABLATION N/A 05/19/2012   Procedure: ATRIAL FLUTTER ABLATION;  Surgeon: Thompson Grayer, MD;  Location: [facility] LAB;  Service: Cardiovascular;  Laterality: N/A;  . BI-VENTRICULAR IMPLANTABLE CARDIOVERTER DEFIBRILLATOR UPGRADE N/A 02/05/2014   Procedure: BI-VENTRICULAR IMPLANTABLE CARDIOVERTER DEFIBRILLATOR UPGRADE;  Surgeon: Evans Lance, MD;  Location:  [facility] CATH LAB;  Service: Cardiovascular;  Laterality: N/A;  . BIV ICD GENERTAOR CHANGE OUT  02/05/2014   Upgrade to Medtronic biventricular ICD, serial number  [id number] by Dr.  [name]  . CARDIAC DEFIBRILLATOR PLACEMENT  2007    Medtronic Maximo VR, serial number [id number]  . PERCUTANEOUS CORONARY STENT INTERVENTION (PCI-S)  January 2002   PTCA/Stent Distal RCA  . PERCUTANEOUS CORONARY STENT INTERVENTION (PCI-S)  June 2002   PTCA/Stent x 3 RCA, thrombolysis - failed  . PERCUTANEOUS CORONARY STENT INTERVENTION (PCI-S)  July 2006   TAXUS stents to prox and distal LAD  . RIGHT/LEFT HEART CATH AND CORONARY ANGIOGRAPHY N/A 02/18/2017   Procedure: RIGHT/LEFT HEART CATH AND CORONARY ANGIOGRAPHY;  Surgeon: Jolaine Artist, MD;  Location: [facility] CV LAB;  Service: Cardiovascular;  Laterality: N/A;    Family History  Problem Relation Age of Onset  . Heart failure Father        Deceased  . Alzheimer's disease Mother        Living  . Heart attack Mother   . CAD Brother   . CAD Brother   . CAD Brother   . CAD Brother   . Healthy Son   . Healthy Daughter   . Diabetes Brother     Social History   Socioeconomic History  . Marital status: Married    Spouse name: Not on file  . Number of children: 4  . Years of education: 19  . Highest education level: Not on file  Social Needs  . Financial resource strain: Not on file  . Food insecurity - worry: Not on file  . Food insecurity - inability: Not on file  . Transportation needs - medical: Not on file  . Transportation needs - non-medical: Not on file  Occupational History  . Occupation: Psychiatrist - currently unemployed  Tobacco Use  . Smoking status: Former Smoker    Packs/day: 1.00    Years: 29.00    Pack years: 29.00    Types: Cigarettes    Last attempt to quit: 03/02/2000    Years since quitting: 17.0  . Smokeless tobacco: Never Used  Substance and Sexual Activity  . Alcohol use: No    Alcohol/week: 0.0 oz  . Drug use: No  . Sexual activity: Not on file  Other Topics Concern  . Not on file  Social History Narrative   Pt lives with wife in a one story home - married for 47 years. He hunts  regularly and is active, walking > 1 mile at times without difficulty.  Has 4 children.     Retired Horticulturist, commercial.  Education: some college.     Current Outpatient Medications:  .  apixaban (ELIQUIS) 5 MG TABS tablet, Take 1 tablet (5 mg total) by mouth 2 (two) times daily., Disp: 60 tablet, Rfl: 6 .  atorvastatin (LIPITOR) 80 MG tablet, TAKE ONE TABLET BY MOUTH ONCE DAILY (Patient taking differently: TAKE 80 MG BY MOUTH ONCE DAILY), Disp: 90 tablet, Rfl: 3 .  digoxin (LANOXIN) 0.125 MG tablet, Take 1 tablet (0.125 mg total) by mouth daily., Disp: 90 tablet, Rfl: 3 .  Dulaglutide (TRULICITY) 6.38 GT/3.6IW SOPN, Inject 0.75 mg into the skin once a week., Disp: 4 pen, Rfl: 2 .  DULoxetine (CYMBALTA) 30 MG capsule, Take 30 mg by mouth daily., Disp: , Rfl:  .  insulin NPH-regular Human (NOVOLIN 70/30 RELION) (70-30) 100 UNIT/ML injection, Inject 30 Units into the skin 2 (two) times daily with  a meal., Disp: 10 mL, Rfl: 12 .  losartan (COZAAR) 25 MG tablet, Take 12.5 mg by mouth at bedtime., Disp: , Rfl:  .  magnesium oxide (MAG-OX) 400 MG tablet, Take 1 tablet (400 mg total) by mouth daily., Disp: 90 tablet, Rfl: 3 .  metFORMIN (GLUCOPHAGE-XR) 750 MG 24 hr tablet, TAKE TWO TABLETS BY MOUTH ONCE DAILY WITH BREAKFAST (Patient taking differently: Take 1,500 mg by mouth daily with breakfast. ), Disp: 180 tablet, Rfl: 0 .  Multiple Vitamins-Minerals (MULTIVITAMIN PO), Take 1 capsule by mouth daily. , Disp: , Rfl:  .  OVER THE COUNTER MEDICATION, Apply 1 application topically daily. Epsom Salt Foot Gel, Disp: , Rfl:  .  sotalol (BETAPACE) 120 MG tablet, TAKE ONE TABLET BY MOUTH TWICE DAILY (Patient taking differently: TAKE 120MG  BY MOUTH TWICE DAILY), Disp: 180 tablet, Rfl: 3 .  torsemide (DEMADEX) 100 MG tablet, Take 50 mg Once Daily only on Monday, Tuesday, Thursday, Friday, and Saturdays., Disp: 30 tablet, Rfl: 6  Allergies  Allergen Reactions  . Penicillins Hives, Itching and Other (See Comments)     Has patient had a PCN reaction causing immediate rash, facial/tongue/throat swelling, SOB or lightheadedness with hypotension: Yes Has patient had a PCN reaction causing severe rash involving mucus membranes or skin necrosis: No Has patient had a PCN reaction that required hospitalization: No Has patient had a PCN reaction occurring within the last 10 years: No If all of the above answers are "NO", then may proceed with Cephalosporin use.      ROS See HPI  Objective  Vitals:   03/29/17 0800  BP: 108/60  Pulse: (!) 54  Resp: 16  Temp: 98 F (36.7 C)  TempSrc: Oral  SpO2: 96%  Height: 5\' 11"  (1.803 m)  Heart rate stable- maintained on beta blocker, following with cardiology  Body mass index is 29.93 kg/m.  Physical Exam Vital signs reviewed. Constitutional: He is oriented to person, place, and time. He appears well-developed and well-nourished. No distress.  Neck: Normal range of motion. Neck is supple. Cardiovascular: Regular rhythm, normal heart sounds, and intact distal pulses.   Pulmonary/Chest: Effort normal and breath sounds normal. No respiratory distress.  Neurological: He is alert and oriented to person, place, and time. Coordination, strength, and speech are normal.  Skin: Skin is warm and dry.  Psychiatric: He has a normal mood and affect. Judgment and thought content normal.   Fall Risk: Fall Risk  01/29/2017 12/04/2016 09/24/2016 06/25/2016 06/17/2016  Falls in the past year? No No No No No  Risk for fall due to : - - History of fall(s) - -    Assessment & Plan F/U to be determined pending lab results. -Reviewed Health Maintenance: declines updating immunizations today

## 2017-03-29 NOTE — Patient Instructions (Signed)
Please head downstairs for lab work.  We will decide what to do with your diabetes medications once I get your a1C results, but do please continue your metformin for now.  We will decide follow up date once I get your lab results.  It was so good to see you. Thanks for letting me take care of you today :)

## 2017-03-29 NOTE — Assessment & Plan Note (Signed)
Stable. Continue metformin. We will determine how to resume additional medications pending a1c results today - Basic metabolic panel; Future - Hemoglobin A1c; Future

## 2017-03-30 DIAGNOSIS — S8264XA Nondisplaced fracture of lateral malleolus of right fibula, initial encounter for closed fracture: Secondary | ICD-10-CM | POA: Diagnosis not present

## 2017-03-31 ENCOUNTER — Other Ambulatory Visit: Payer: Self-pay | Admitting: Nurse Practitioner

## 2017-03-31 DIAGNOSIS — E1121 Type 2 diabetes mellitus with diabetic nephropathy: Secondary | ICD-10-CM

## 2017-03-31 MED ORDER — DULAGLUTIDE 0.75 MG/0.5ML ~~LOC~~ SOAJ: 0.7500 mg | SUBCUTANEOUS | 2 refills | Status: DC

## 2017-04-02 ENCOUNTER — Other Ambulatory Visit (HOSPITAL_COMMUNITY): Payer: Self-pay

## 2017-04-02 NOTE — [place] Notes (Signed)
Paramedicine Encounter    Patient ID: [name] [name], male    DOB: 11/23/1950, 67 y.o.   MRN: [id number]   Patient Care Team: [name], NP as PCP - General (Nurse Practitioner)  Patient Active Problem List   Diagnosis Date Noted  . Acute on chronic combined systolic and diastolic CHF (congestive heart failure) ([place]) 11/19/2016  . Acute on chronic left and right systolic w/ diastolic heart failure, NYHA class 2 ([place]) 11/18/2016  . Hx of atrial flutter 11/18/2016  . CAD (coronary artery disease)-RCA-T, 70% PL (off CFX), 99% Prox LAD/90% Dist LAD, S/P TAXUS stent x 2 11/18/2016  . HTN (hypertension) 11/18/2016  . Diabetes mellitus with complication ([place]) 25/42/7062  . Thrombocytopenia ([place]) 11/18/2016  . Bilateral lower extremity edema 11/12/2016  . Diabetic polyneuropathy associated with diabetes mellitus due to underlying condition ([place]) 05/29/2015  . Chronic systolic heart failure ([place]) 02/05/2014  . Medtronic biventricular ICD, serial number  [id number]  02/05/2014  . CAD (coronary artery disease), native coronary artery 01/08/2014  . Chronic renal disease, stage III ([place]) 01/08/2014  . Heart failure ([place]) 12/16/2013  . Mitral regurgitation 04/17/2013  . Chronic systolic dysfunction of left ventricle 06/29/2012  . Atrial flutter ([place]) 06/03/2012  . Acute kidney injury (nontraumatic) ([place]) 05/31/2012  . Chronic anticoagulation   . Acute on chronic systolic heart failure ([place]) 05/21/2012  . Nausea with vomiting 05/18/2012  . PAF (paroxysmal atrial fibrillation) ([place]) 05/17/2012  . Orthopnea 05/17/2012  . TIA (transient ischemic attack) 05/29/2010  . CHEST PAIN 11/13/2008  . VENTRICULAR TACHYCARDIA 08/14/2008  . Automatic implantable cardioverter-defibrillator in situ 08/14/2008  . Type 2 diabetes with nephropathy ([place]) 08/11/2008  . DYSLIPIDEMIA 08/11/2008  . Cardiomyopathy, ischemic 08/11/2008  . Disorder resulting from impaired renal function 08/11/2008  .  HYPERCHOLESTEROLEMIA 04/29/2006  . HYPERTENSION, BENIGN SYSTEMIC 04/29/2006  . MYOCARDIAL INFARCTION, OLD 04/29/2006  . Coronary atherosclerosis 04/29/2006  . NEPHROLITHIASIS 04/29/2006    Current Outpatient Medications:  .  apixaban (ELIQUIS) 5 MG TABS tablet, Take 1 tablet (5 mg total) by mouth 2 (two) times daily., Disp: 60 tablet, Rfl: 6 .  atorvastatin (LIPITOR) 80 MG tablet, TAKE ONE TABLET BY MOUTH ONCE DAILY (Patient taking differently: TAKE 80 MG BY MOUTH ONCE DAILY), Disp: 90 tablet, Rfl: 3 .  digoxin (LANOXIN) 0.125 MG tablet, Take 1 tablet (0.125 mg total) by mouth daily., Disp: 90 tablet, Rfl: 3 .  Dulaglutide (TRULICITY) 3.76 EG/3.1DV SOPN, Inject 0.75 mg into the skin once a week., Disp: 4 pen, Rfl: 2 .  DULoxetine (CYMBALTA) 30 MG capsule, Take 30 mg by mouth daily., Disp: , Rfl:  .  insulin NPH-regular Human (NOVOLIN 70/30 RELION) (70-30) 100 UNIT/ML injection, Inject 30 Units into the skin 2 (two) times daily with a meal., Disp: 10 mL, Rfl: 12 .  losartan (COZAAR) 25 MG tablet, Take 12.5 mg by mouth at bedtime., Disp: , Rfl:  .  magnesium oxide (MAG-OX) 400 MG tablet, Take 1 tablet (400 mg total) by mouth daily., Disp: 90 tablet, Rfl: 3 .  metFORMIN (GLUCOPHAGE-XR) 750 MG 24 hr tablet, TAKE 2 TABLETS BY MOUTH ONCE DAILY WITH BREAKFAST, Disp: 180 tablet, Rfl: 0 .  Multiple Vitamins-Minerals (MULTIVITAMIN PO), Take 1 capsule by mouth daily. , Disp: , Rfl:  .  OVER THE COUNTER MEDICATION, Apply 1 application topically daily. Epsom Salt Foot Gel, Disp: , Rfl:  .  sotalol (BETAPACE) 120 MG tablet, TAKE ONE TABLET BY MOUTH TWICE DAILY (Patient taking differently: TAKE 120MG  BY  MOUTH TWICE DAILY), Disp: 180 tablet, Rfl: 3 .  torsemide (DEMADEX) 100 MG tablet, Take 50 mg Once Daily only on Monday, Tuesday, Thursday, Friday, and Saturdays., Disp: 30 tablet, Rfl: 6 Allergies  Allergen Reactions  . Penicillins Hives, Itching and Other (See Comments)    Has patient had a PCN reaction  causing immediate rash, facial/tongue/throat swelling, SOB or lightheadedness with hypotension: Yes Has patient had a PCN reaction causing severe rash involving mucus membranes or skin necrosis: No Has patient had a PCN reaction that required hospitalization: No Has patient had a PCN reaction occurring within the last 10 years: No If all of the above answers are "NO", then may proceed with Cephalosporin use.       Social History   Socioeconomic History  . Marital status: Married    Spouse name: Not on file  . Number of children: 4  . Years of education: 61  . Highest education level: Not on file  Social Needs  . Financial resource strain: Not on file  . Food insecurity - worry: Not on file  . Food insecurity - inability: Not on file  . Transportation needs - medical: Not on file  . Transportation needs - non-medical: Not on file  Occupational History  . Occupation: Psychiatrist - currently unemployed  Tobacco Use  . Smoking status: Former Smoker    Packs/day: 1.00    Years: 29.00    Pack years: 29.00    Types: Cigarettes    Last attempt to quit: 03/02/2000    Years since quitting: 17.0  . Smokeless tobacco: Never Used  Substance and Sexual Activity  . Alcohol use: No    Alcohol/week: 0.0 oz  . Drug use: No  . Sexual activity: Not on file  Other Topics Concern  . Not on file  Social History Narrative   Pt lives with wife in a one story home - married for 71 years. He hunts regularly and is active, walking > 1 mile at times without difficulty.  Has 4 children.     Retired Horticulturist, commercial.  Education: some college.    Physical Exam  Constitutional: He is oriented to person, place, and time.  Cardiovascular: Normal rate and regular rhythm.  Pulmonary/Chest: Effort normal and breath sounds normal. No respiratory distress. He has no wheezes. He has no rales.  Abdominal: Soft. He exhibits no distension.  Musculoskeletal: Normal range of motion. He exhibits no edema.   Neurological: He is alert and oriented to person, place, and time.  Skin: Skin is warm and dry.  Psychiatric: He has a normal mood and affect.    SAFE - 02/12/17 1000      Situation   Admitting diagnosis  chf    Heart failure history  Exisiting    Comorbidities  DM;HTN    Readmitted within 30 days  No    Hospital admission within past 12 months  Yes    number of hospital admissions  1    number of ED visits  1      Assessment   Lives alone  No    Primary support person  wife    Mode of transportation  personal car    Home equipement  Scale      Weight   Weighs self daily  Yes    Scale provided  No    Records on weight chart  No      Resources   Has "Living better w/heart failure" book  Yes  Has HF Zone tool  Yes    Able to identify yellow zone signs/when to call MD  Yes    Records zone daily  Yes      Medications   Uses a pill box  Yes    Who stocks the pill box  self    Pill box checked this visit  Yes    Pill box refilled this visit  No    Difficulty obtaining medications  No    Mail order medications  No    Missed one or more doses of medications per week  No      Nutrition   Patient receives meals on wheels  No    Patient follows low sodium diet  Yes    Has foods at home that meet the current recommended diet  Yes    Patient follows low sugar/card diet  Yes    Nutritional concerns/issues  No      Activity Level   ADL's/Mobility  Independent    How many feet can patient ambulate  150    Typical activity level  active    Barriers  hastened pace      Urine   Difficulty urinating  No    Changes in urine  None      Time spent with patient   Time spent with patient   45 Minutes         Future Appointments  Date Time Provider [place]  04/09/2017 10:00 AM [facility] VAD CLINIC [facility] None  04/14/2017 11:30 AM Narda Amber K, DO [facility] None  04/21/2017 12:00 PM Bensimhon, Shaune Pascal, MD [facility] None  04/26/2017  3:20 PM [facility] DEVICE  REMOTES [facility]    BP 92/60 (BP Location: Left Arm, Patient Position: Sitting, Cuff Size: Large)   Pulse 68   Resp 16   SpO2 96%   Weight yesterday- Did not weigh Last visit weight- 214 lb  Mr Lata was seen at home today and reported feeling generally well. He denied SOB, headache, dizziness or orthopnea. He has not weighed himself since having a boot placed on his left ankle last week because the orthopedist told him to not bare weight on the ankle without the brace and the brace has significant weight. He said he does not feel like he has been gaining weight and he did not display any edema, JVD or abdominal distention. His medications were verified and he continues to fill his own pillbox. Our next visit will be at the LVAD clinic for his information session.   Time spent with patient: 25 minutes  Jacquiline Doe, EMT 04/02/17  ACTION: Home visit completed Next visit planned for 1 week

## 2017-04-05 ENCOUNTER — Other Ambulatory Visit: Payer: Self-pay | Admitting: Internal Medicine

## 2017-04-08 ENCOUNTER — Other Ambulatory Visit: Payer: Self-pay | Admitting: Internal Medicine

## 2017-04-09 ENCOUNTER — Other Ambulatory Visit (HOSPITAL_COMMUNITY): Payer: Self-pay | Admitting: *Deleted

## 2017-04-09 ENCOUNTER — Other Ambulatory Visit (HOSPITAL_COMMUNITY): Payer: Self-pay

## 2017-04-09 ENCOUNTER — Ambulatory Visit (HOSPITAL_COMMUNITY)
Admission: RE | Admit: 2017-04-09 | Discharge: 2017-04-09 | Disposition: A | Discharge status: Home / Self Care | Payer: PPO | Source: Ambulatory Visit | Attending: Cardiology | Admitting: Cardiology

## 2017-04-09 VITALS — BP 101/67 | HR 77 | Ht 71.0 in | Wt 216.6 lb

## 2017-04-09 DIAGNOSIS — I5082 Biventricular heart failure: Secondary | ICD-10-CM | POA: Diagnosis not present

## 2017-04-09 DIAGNOSIS — I255 Ischemic cardiomyopathy: Secondary | ICD-10-CM | POA: Insufficient documentation

## 2017-04-09 DIAGNOSIS — Z8249 Family history of ischemic heart disease and other diseases of the circulatory system: Secondary | ICD-10-CM | POA: Insufficient documentation

## 2017-04-09 DIAGNOSIS — I130 Hypertensive heart and chronic kidney disease with heart failure and stage 1 through stage 4 chronic kidney disease, or unspecified chronic kidney disease: Secondary | ICD-10-CM | POA: Insufficient documentation

## 2017-04-09 DIAGNOSIS — Z880 Allergy status to penicillin: Secondary | ICD-10-CM | POA: Diagnosis not present

## 2017-04-09 DIAGNOSIS — Z87442 Personal history of urinary calculi: Secondary | ICD-10-CM | POA: Insufficient documentation

## 2017-04-09 DIAGNOSIS — E1122 Type 2 diabetes mellitus with diabetic chronic kidney disease: Secondary | ICD-10-CM | POA: Insufficient documentation

## 2017-04-09 DIAGNOSIS — E11319 Type 2 diabetes mellitus with unspecified diabetic retinopathy without macular edema: Secondary | ICD-10-CM | POA: Diagnosis not present

## 2017-04-09 DIAGNOSIS — I5043 Acute on chronic combined systolic (congestive) and diastolic (congestive) heart failure: Secondary | ICD-10-CM

## 2017-04-09 DIAGNOSIS — Z87891 Personal history of nicotine dependence: Secondary | ICD-10-CM | POA: Insufficient documentation

## 2017-04-09 DIAGNOSIS — Z79899 Other long term (current) drug therapy: Secondary | ICD-10-CM | POA: Insufficient documentation

## 2017-04-09 DIAGNOSIS — Z7901 Long term (current) use of anticoagulants: Secondary | ICD-10-CM | POA: Diagnosis not present

## 2017-04-09 DIAGNOSIS — Z820 Family history of epilepsy and other diseases of the nervous system: Secondary | ICD-10-CM | POA: Insufficient documentation

## 2017-04-09 DIAGNOSIS — I2510 Atherosclerotic heart disease of native coronary artery without angina pectoris: Secondary | ICD-10-CM | POA: Diagnosis not present

## 2017-04-09 DIAGNOSIS — E7800 Pure hypercholesterolemia, unspecified: Secondary | ICD-10-CM | POA: Insufficient documentation

## 2017-04-09 DIAGNOSIS — N183 Chronic kidney disease, stage 3 (moderate): Secondary | ICD-10-CM | POA: Insufficient documentation

## 2017-04-09 DIAGNOSIS — I480 Paroxysmal atrial fibrillation: Secondary | ICD-10-CM | POA: Diagnosis not present

## 2017-04-09 DIAGNOSIS — Z794 Long term (current) use of insulin: Secondary | ICD-10-CM | POA: Diagnosis not present

## 2017-04-09 DIAGNOSIS — Z95810 Presence of automatic (implantable) cardiac defibrillator: Secondary | ICD-10-CM | POA: Diagnosis not present

## 2017-04-09 DIAGNOSIS — Z833 Family history of diabetes mellitus: Secondary | ICD-10-CM | POA: Insufficient documentation

## 2017-04-09 DIAGNOSIS — I5022 Chronic systolic (congestive) heart failure: Secondary | ICD-10-CM | POA: Diagnosis not present

## 2017-04-09 LAB — CBC
HCT: 42.1 % (ref 39.0–52.0)
Hemoglobin: 14.1 g/dL (ref 13.0–17.0)
MCH: 28.2 pg (ref 26.0–34.0)
MCHC: 33.5 g/dL (ref 30.0–36.0)
MCV: 84.2 fL (ref 78.0–100.0)
PLATELETS: 236 10*3/uL (ref 150–400)
RBC: 5 MIL/uL (ref 4.22–5.81)
RDW: 15.8 % — AB (ref 11.5–15.5)
WBC: 5.7 10*3/uL (ref 4.0–10.5)

## 2017-04-09 LAB — BASIC METABOLIC PANEL
ANION GAP: 16 — AB (ref 5–15)
BUN: 30 mg/dL — AB (ref 6–20)
CALCIUM: 8.9 mg/dL (ref 8.9–10.3)
CO2: 22 mmol/L (ref 22–32)
CREATININE: 1.52 mg/dL — AB (ref 0.61–1.24)
Chloride: 98 mmol/L — ABNORMAL LOW (ref 101–111)
GFR calc Af Amer: 53 mL/min — ABNORMAL LOW (ref >60)
GFR, EST NON AFRICAN AMERICAN: 46 mL/min — AB (ref >60)
GLUCOSE: 186 mg/dL — AB (ref 65–99)
Potassium: 3.8 mmol/L (ref 3.5–5.1)
Sodium: 136 mmol/L (ref 135–145)

## 2017-04-09 LAB — URIC ACID: Uric Acid, Serum: 8.1 mg/dL — ABNORMAL HIGH (ref 4.4–7.6)

## 2017-04-09 LAB — LACTATE DEHYDROGENASE: LDH: 175 U/L (ref 98–192)

## 2017-04-09 LAB — ANTITHROMBIN III: ANTITHROMB III FUNC: 94 % (ref 75–120)

## 2017-04-09 LAB — T4, FREE: Free T4: 0.86 ng/dL (ref 0.61–1.12)

## 2017-04-09 LAB — PREALBUMIN: Prealbumin: 25 mg/dL (ref 18–38)

## 2017-04-09 NOTE — [place] Notes (Signed)
Initial Encounter with LVAD Team and MCS Introduction:  [name] [name] is a 67 y.o. male whom  has a past medical history of AICD (automatic cardioverter/defibrillator) present (02/05/2014), Atrial flutter ([place]) (04/2012), CAD (coronary artery disease) (1610,9604 X 2 ), CHF (congestive heart failure) ([place]), Chronic anticoagulation, Chronic systolic heart failure ([place]), CKD (chronic kidney disease), Diabetic retinopathy ([place]), DM type 2 (diabetes mellitus, type 2) ([place]), HTN (hypertension), Hypercholesteremia, ICD (implantable cardiac defibrillator) in place, Ischemic cardiomyopathy (March 2015), Nephrolithiasis, and Ventricular tachycardia ([place]).. We have been asked to evaluate the patient for advanced therapies which include Left Ventricular Assist Device implantation.   Lab Results  Component Value Date   ABORH O POS 11/26/2016   ABORH O POS 11/26/2016    Lab Results  Component Value Date   HGBA1C 11.7 (H) 03/29/2017   Lab Results  Component Value Date   CREATININE 1.52 (H) 04/09/2017   CREATININE 1.01 03/29/2017   CREATININE 1.34 (H) 03/19/2017    VAD educational packet including "A Decision Aid for Left Ventricular Assist Device (LVAD) for Destination Therapy",  "Understanding Your Options with Advanced Heart Failure", "[place] Patient Agreement for VAD Evaluation and Potential Implantation" consent, and "[place] HM II Patient Education" booklet reviewed in detail patient, wife, and daughter. Packet given for their reference.   Pt and family met briefly with HM III patient and her caregiver. Equipment demonstrated per patient. Questions answered by patient.    Explained that LVAD can be implanted for two indications in the setting of advanced left ventricular heart failure treatment:  Bridge to transplant - used for patients who cannot safely wait for heart transplant without this device.  Or   Destination therapy - used for patients until end of life or recovery of  heart function.  Discussed that at this point [name] would be considered for DT therapy should he be deemed an acceptable VAD candidate. Per Dr. [name], patient will need to get his diabetes better controlled. Will send referral to Dr. Benjiman Core, endocrinology.  Provided brief equipment overview of the HeartMate II pump and discussed placement, surgical procedure, peripheral equipment, life-long coumadin therapy, importance of medication adherence and clinic follow up for as long as patient is living on support, life-style modifications, as well as need for caregiver to be successful with this therapy.   We discussed the process of the evaluation period and how a decision was made by the [facility] team whether he would be an appropriate candidate for therapy or not. Evaluation consent was reviewed and signed to begin evaluation process.  Patient does prefer to go straight to transplant if at all possible. Per Dr. [name], plan will be to complete LVAD evaluation and send info to Dr. Mosetta Pigeon at [facility] for transplant consideration.  Caregiver Support: Quentavious Rittenhouse (wife)  Home Inspection Checklist: verified that patient has reliable telephone, running water and electricity in the home.   Advised the patient review the materials, contact either myself, Tanda Rockers, or Balinda Quails with questions. Contact information provided.    Follow-Up Plan:  1. Labs drawn today 2. Will schedule necessary testing to complete eval. Insurance approval will be obtained prior to tests and imaging. 3. Pt has f/u scheduled with Dr. [name] 04/21/17 at 12 noon. Pt is requesting appointment be moved to a Friday.  Session Time: 120 mins

## 2017-04-09 NOTE — [place] Notes (Signed)
Paramedicine Encounter   Patient ID: [name] [name] , male,   DOB: Dec 01, 1950,67 y.o.,  MRN: [id number]   Mr Savitz was seen in the clinic today along with the LVAD team. This meeting was for informational purposes only but Mr Upperman requested I attend. Dr [name] is referring him to an endocrinologist to work on getting his A1c controlled. Mr Deren advised that he is open to LVAD as a bridge to transplant but he is ultimately preferring a transplant.  Time spent with patient: 55 minutes  Jacquiline Doe, EMT 04/09/2017   ACTION: Home visit completed Next visit planned for 1 week

## 2017-04-10 LAB — HEPATITIS B SURFACE ANTIGEN: HEP B S AG: NEGATIVE

## 2017-04-10 LAB — HEPATITIS B CORE ANTIBODY, IGM: Hep B C IgM: NEGATIVE

## 2017-04-10 LAB — HIV ANTIBODY (ROUTINE TESTING W REFLEX): HIV SCREEN 4TH GENERATION: NONREACTIVE

## 2017-04-10 LAB — HEPATITIS C ANTIBODY

## 2017-04-10 LAB — HEPATITIS B SURFACE ANTIBODY,QUALITATIVE: Hep B S Ab: NONREACTIVE

## 2017-04-11 LAB — LUPUS ANTICOAGULANT
DRVVT: 60.6 s — AB (ref 0.0–47.0)
PTT LA: 40.8 s (ref 0.0–51.9)
Thrombin Time: 18.7 s (ref 0.0–23.0)
dPT Confirm Ratio: 1.14 Ratio (ref 0.00–1.40)
dPT: 56.6 s — ABNORMAL HIGH (ref 0.0–55.0)

## 2017-04-11 LAB — DRVVT CONFIRM: dRVVT Confirm: 0.9 ratio (ref 0.8–1.2)

## 2017-04-11 LAB — DRVVT MIX: DRVVT MIX: 47.5 s — AB (ref 0.0–47.0)

## 2017-04-11 NOTE — [place] Notes (Signed)
Advanced Heart Failure Clinic Note     Primary Cardiologist: Dr. [name] Primary HF: Dr. [name]   HPI: [name] [name] is a 67 y.o. 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   Admitted 11/18/16 with ADHF. Echo 11/19/16 with LVEF 15-20%, Mod MR, Mod LAE, Mild RV dilation and moderately reduced function, Moderate TR, PA peak pressure 44 mm Hg. Device interrogation showed recent bout of AF but was back in NSR. Suspect AF contributing to decompensation. Work up planned for possible transplant, RV function probably will not support LVAD. Discharge weight was 225 pounds on 11/27/16.   In 12/18 he had R/L heart cath with 3v CAD and low filling pressures/low output. I saw him a few weeks ago and he was mildly volume overloaded. Torsemide increased  to 62m five days per week (was only M/W/F). Feels OK. Can do ADLs but gets winded with much more. Denies orthopnea, PND or CP. Working on cutting back on ice.  Weight stable. Compliant with meds. No syncope. BP typically in 90s.    Studies:   CPX 12/18: Resting HR: 57 Peak HR: 67  (44% age predicted max HR) BP rest: 90/64 BP peak: 78/58 Peak VO2: 9.3 (41% predicted peak VO2) VE/VCO2 slope: 53 OUES: 1.09 Peak RER: 0.90  R/L Cath 12/18:  1) LAD diffuse 60% stenosis 2) LCX multiple 40% lesions 3) RCA chronically occluded proximally with L to R collaterals  Ao = 99/56 (73) LV = 101/5 RA = 1 RV = 29/1 PA = 23/11 (16) PCW = 6 Fick cardiac output/index = 3.8/1.7 Thermo CO/CI = 4.1/1.9 PVR = 2.6 Ao sat = 99% PA sat = 60%, 61% PaPI = 12 RA/PCWP = 0.16  Review of systems complete and found to be negative unless listed in HPI.    Past Medical History:  Diagnosis Date  . AICD (automatic cardioverter/defibrillator) present 02/05/2014   Upgrade to Medtronic biventricular ICD, serial number  [id number]     . Atrial flutter ([facility] 04/2012   s/p TEE-EPS+RFCA 04/2012  . CAD (coronary artery disease) 23888,2800X 2    RCA-T, 70% PL (off CFX), 99% Prox LAD/90% Dist LAD, S/P TAXUS stent x 2  . CHF (congestive heart failure) ([place]   . Chronic anticoagulation   . Chronic systolic heart failure ([facility]   . CKD (chronic kidney disease)   . Diabetic retinopathy ([facility]   . DM type 2 (diabetes mellitus, type 2) (HCC)    insulin dependent  . HTN (hypertension)   . Hypercholesteremia    ablation  . ICD (implantable cardiac defibrillator) in place   . Ischemic cardiomyopathy March 2015   20-25% 2D   . Nephrolithiasis   . Ventricular tachycardia ([facility]     Current Outpatient Medications  Medication Sig Dispense Refill  . apixaban (ELIQUIS) 5 MG TABS tablet Take 1 tablet (5 mg total) by mouth 2 (two) times daily. 60 tablet 6  . atorvastatin (LIPITOR) 80 MG tablet TAKE ONE TABLET BY MOUTH ONCE DAILY (Patient taking differently: TAKE 80 MG BY MOUTH ONCE DAILY) 90 tablet 3  . digoxin (LANOXIN) 0.125 MG tablet Take 1 tablet (0.125 mg total) by mouth daily. 90 tablet 3  . Dulaglutide (TRULICITY) 03.49MZP/9.1TASOPN Inject 0.75 mg into the skin once a week. 4 pen 2  . DULoxetine (CYMBALTA) 30 MG capsule Take  30 mg by mouth daily.    . insulin NPH-regular Human (NOVOLIN 70/30 RELION) (70-30) 100 UNIT/ML injection Inject 30 Units into the skin 2 (two) times daily with a meal. 10 mL 12  . losartan (COZAAR) 25 MG tablet Take 12.5 mg by mouth at bedtime.    . magnesium oxide (MAG-OX) 400 MG tablet Take 1 tablet (400 mg total) by mouth daily. 90 tablet 3  . metFORMIN (GLUCOPHAGE-XR) 750 MG 24 hr tablet TAKE 2 TABLETS BY MOUTH ONCE DAILY WITH BREAKFAST 180 tablet 0  . Multiple Vitamins-Minerals (MULTIVITAMIN PO) Take 1 capsule by mouth daily.     Marland Kitchen OVER THE COUNTER MEDICATION Apply 1 application topically daily. Epsom Salt Foot Gel    . sotalol (BETAPACE) 120 MG tablet TAKE ONE TABLET BY MOUTH TWICE DAILY (Patient taking  differently: TAKE 120MG BY MOUTH TWICE DAILY) 180 tablet 3  . torsemide (DEMADEX) 100 MG tablet Take 50 mg Once Daily only on Monday, Tuesday, Thursday, Friday, and Saturdays. 30 tablet 6   No current facility-administered medications for this encounter.     Allergies  Allergen Reactions  . Penicillins Hives, Itching and Other (See Comments)    Has patient had a PCN reaction causing immediate rash, facial/tongue/throat swelling, SOB or lightheadedness with hypotension: Yes Has patient had a PCN reaction causing severe rash involving mucus membranes or skin necrosis: No Has patient had a PCN reaction that required hospitalization: No Has patient had a PCN reaction occurring within the last 10 years: No If all of the above answers are "NO", then may proceed with Cephalosporin use.       Social History   Socioeconomic History  . Marital status: Married    Spouse name: Not on file  . Number of children: 4  . Years of education: 7  . Highest education level: Not on file  Social Needs  . Financial resource strain: Not on file  . Food insecurity - worry: Not on file  . Food insecurity - inability: Not on file  . Transportation needs - medical: Not on file  . Transportation needs - non-medical: Not on file  Occupational History  . Occupation: Psychiatrist - currently unemployed  Tobacco Use  . Smoking status: Former Smoker    Packs/day: 1.00    Years: 29.00    Pack years: 29.00    Types: Cigarettes    Last attempt to quit: 03/02/2000    Years since quitting: 17.1  . Smokeless tobacco: Never Used  Substance and Sexual Activity  . Alcohol use: No    Alcohol/week: 0.0 oz  . Drug use: No  . Sexual activity: Not on file  Other Topics Concern  . Not on file  Social History Narrative   Pt lives with wife in a one story home - married for 19 years. He hunts regularly and is active, walking > 1 mile at times without difficulty.  Has 4 children.     Retired Horticulturist, commercial.  Education:  some college.   Family History  Problem Relation Age of Onset  . Heart failure Father        Deceased  . Alzheimer's disease Mother        Living  . Heart attack Mother   . CAD Brother   . CAD Brother   . CAD Brother   . CAD Brother   . Healthy Son   . Healthy Daughter   . Diabetes Brother    Vitals:   04/09/17 1108 04/09/17 1109  BP: (!) 96/56 101/67  Pulse: 77   SpO2: 98%   Weight: 216 lb 9.6 oz (98.2 kg)   Height: '5\' 11"'  (1.803 m)    Wt Readings from Last 3 Encounters:  04/09/17 216 lb 9.6 oz (98.2 kg)  04/09/17 209 lb (94.8 kg)  03/26/17 214 lb 9.6 oz (97.3 kg)    PHYSICAL EXAM: General:  Well appearing. No resp difficulty HEENT: normal Neck: supple. no JVD. Carotids 2+ bilat; no bruits. No lymphadenopathy or thryomegaly appreciated. Cor: PMI laterally displaced. Regular rate & rhythm. No rubs, gallops or murmurs. Lungs: clear Abdomen: obese soft, nontender, nondistended. No hepatosplenomegaly. No bruits or masses. Good bowel sounds. Extremities: no cyanosis, clubbing, rash, edema Neuro: alert & orientedx3, cranial nerves grossly intact. moves all 4 extremities w/o difficulty. Affect pleasant   ASSESSMENT & PLAN: 1. Chronic systolic CHF: - Echo 0/08/67 EF 15-20% biventricular Failure with moderate RV dysfunction (likely combined ICM/NICM). He has Medtronic BiV ICD in place.  - Cath 12/18 with stable 1v CAD. Low filling pressures with CI 1.7  - CPX testing 12/18 with submax test but severe HF limitation with exertional hypotension. Peak VO2: 9.3 (41% predicted peak VO2). VE/VCO2 slope: 53 - Remains NYHA III-IIIb. Volume status looks better with increase in diuretics Will check labs today to make sure renal function stable.  - Continue losartan 12.5. BP too low to titrate - off b-blocker due to low output and low BP and bradycardia - continue digoxin 0.125 - On sotalol for VT. Continue for now.  - I met with him and his family today in conjunction with our VAD  coordinators. We had a long discussion about the role of advanced therapies including LVAD and transplant. We also discussed the need for better glycemic control prior to moving forward. We will begin the process and arrange for transplant evaluation at [facility] (he is blood type O). Refer to Endocrine for help with [name] control.   2. [name] - recent cath with stable CAD as above - no s/s ischemia. Off ASA with apixaban. Continue statin   3. [name] - Recent HgBa1c was elevated,  - Consider Jardiance - Will need to adequate control to permit advanced therapies. Refer to Endocrine  5. CKD III - Check BMET today  6. [name] - s/p ICD - Continue sotalol.   7. PAF s/p ablation 04/2012 - In NSR today.     - Continue Eliquis.  - Maintaining NSR.   Total time spent 45 minutes. Over half that time spent discussing above.    Glori Bickers, MD 04/11/17

## 2017-04-13 ENCOUNTER — Other Ambulatory Visit (HOSPITAL_COMMUNITY): Payer: Self-pay | Admitting: *Deleted

## 2017-04-13 ENCOUNTER — Telehealth (HOSPITAL_COMMUNITY): Payer: Self-pay | Admitting: *Deleted

## 2017-04-13 DIAGNOSIS — I509 Heart failure, unspecified: Secondary | ICD-10-CM

## 2017-04-13 DIAGNOSIS — I5040 Unspecified combined systolic (congestive) and diastolic (congestive) heart failure: Secondary | ICD-10-CM

## 2017-04-13 NOTE — Telephone Encounter (Signed)
Called patient regarding appointments made for VAD eval:  04/21/17: CT chest/abd/pelvis at 1015- must pick up contrast on the 18th     Dr [name] at 1200     Pre-VAD vascular studies 1400 and 1500  04/26/17: PFT's at 0900. Instructed to use [facility] parking and then go to admitting to check in.  Instructed not to smoke, use breathing medications, or use caffeine 4 hours prior to  test.  04/27/17: Dr Prescott Gum at 1600  Verbalized understanding of above instructions.  Balinda Quails RN, VAD Coordinator 24/7 pager [phone]

## 2017-04-13 NOTE — Addendum Note (Signed)
Addended by: Scarlette Calico on: 04/13/2017 01:44 PM   Modules accepted: Orders

## 2017-04-14 ENCOUNTER — Ambulatory Visit: Payer: PPO | Admitting: Neurology

## 2017-04-15 LAB — FACTOR 5 LEIDEN

## 2017-04-16 ENCOUNTER — Other Ambulatory Visit (HOSPITAL_COMMUNITY): Payer: Self-pay

## 2017-04-16 NOTE — Progress Notes (Signed)
Paramedicine Encounter    Patient ID: [name] [name], male    DOB: 1950-04-24, 67 y.o.   MRN: [id number]   Patient Care Team: [name], NP as PCP - General (Nurse Practitioner)  Patient Active Problem List   Diagnosis Date Noted  . Acute on chronic combined systolic and diastolic CHF (congestive heart failure) ([place]) 11/19/2016  . Acute on chronic left and right systolic w/ diastolic heart failure, NYHA class 2 ([place]) 11/18/2016  . Hx of atrial flutter 11/18/2016  . CAD (coronary artery disease)-RCA-T, 70% PL (off CFX), 99% Prox LAD/90% Dist LAD, S/P TAXUS stent x 2 11/18/2016  . HTN (hypertension) 11/18/2016  . Diabetes mellitus with complication ([place]) 46/27/0350  . Thrombocytopenia ([place]) 11/18/2016  . Bilateral lower extremity edema 11/12/2016  . Diabetic polyneuropathy associated with diabetes mellitus due to underlying condition ([place]) 05/29/2015  . Chronic systolic heart failure ([place]) 02/05/2014  . Medtronic biventricular ICD, serial number  [id number]  02/05/2014  . CAD (coronary artery disease), native coronary artery 01/08/2014  . Chronic renal disease, stage III ([place]) 01/08/2014  . Heart failure ([place]) 12/16/2013  . Mitral regurgitation 04/17/2013  . Chronic systolic dysfunction of left ventricle 06/29/2012  . Atrial flutter ([place]) 06/03/2012  . Acute kidney injury (nontraumatic) ([place]) 05/31/2012  . Chronic anticoagulation   . Acute on chronic systolic heart failure ([place]) 05/21/2012  . Nausea with vomiting 05/18/2012  . PAF (paroxysmal atrial fibrillation) ([place]) 05/17/2012  . Orthopnea 05/17/2012  . TIA (transient ischemic attack) 05/29/2010  . CHEST PAIN 11/13/2008  . VENTRICULAR TACHYCARDIA 08/14/2008  . Automatic implantable cardioverter-defibrillator in situ 08/14/2008  . Type 2 diabetes with nephropathy ([place]) 08/11/2008  . DYSLIPIDEMIA 08/11/2008  . Cardiomyopathy, ischemic 08/11/2008  . Disorder resulting from impaired renal function 08/11/2008  .  HYPERCHOLESTEROLEMIA 04/29/2006  . HYPERTENSION, BENIGN SYSTEMIC 04/29/2006  . MYOCARDIAL INFARCTION, OLD 04/29/2006  . Coronary atherosclerosis 04/29/2006  . NEPHROLITHIASIS 04/29/2006    Current Outpatient Medications:  .  apixaban (ELIQUIS) 5 MG TABS tablet, Take 1 tablet (5 mg total) by mouth 2 (two) times daily., Disp: 60 tablet, Rfl: 6 .  atorvastatin (LIPITOR) 80 MG tablet, TAKE ONE TABLET BY MOUTH ONCE DAILY (Patient taking differently: TAKE 80 MG BY MOUTH ONCE DAILY), Disp: 90 tablet, Rfl: 3 .  digoxin (LANOXIN) 0.125 MG tablet, Take 1 tablet (0.125 mg total) by mouth daily., Disp: 90 tablet, Rfl: 3 .  Dulaglutide (TRULICITY) 0.93 GH/8.2XH SOPN, Inject 0.75 mg into the skin once a week., Disp: 4 pen, Rfl: 2 .  DULoxetine (CYMBALTA) 30 MG capsule, Take 30 mg by mouth daily., Disp: , Rfl:  .  insulin NPH-regular Human (NOVOLIN 70/30 RELION) (70-30) 100 UNIT/ML injection, Inject 30 Units into the skin 2 (two) times daily with a meal., Disp: 10 mL, Rfl: 12 .  losartan (COZAAR) 25 MG tablet, Take 12.5 mg by mouth at bedtime., Disp: , Rfl:  .  magnesium oxide (MAG-OX) 400 MG tablet, Take 1 tablet (400 mg total) by mouth daily., Disp: 90 tablet, Rfl: 3 .  metFORMIN (GLUCOPHAGE-XR) 750 MG 24 hr tablet, TAKE 2 TABLETS BY MOUTH ONCE DAILY WITH BREAKFAST, Disp: 180 tablet, Rfl: 0 .  Multiple Vitamins-Minerals (MULTIVITAMIN PO), Take 1 capsule by mouth daily. , Disp: , Rfl:  .  OVER THE COUNTER MEDICATION, Apply 1 application topically daily. Epsom Salt Foot Gel, Disp: , Rfl:  .  sotalol (BETAPACE) 120 MG tablet, TAKE ONE TABLET BY MOUTH TWICE DAILY (Patient taking differently: TAKE 120MG  BY  MOUTH TWICE DAILY), Disp: 180 tablet, Rfl: 3 .  torsemide (DEMADEX) 100 MG tablet, Take 50 mg Once Daily only on Monday, Tuesday, Thursday, Friday, and Saturdays., Disp: 30 tablet, Rfl: 6 Allergies  Allergen Reactions  . Penicillins Hives, Itching and Other (See Comments)    Has patient had a PCN reaction  causing immediate rash, facial/tongue/throat swelling, SOB or lightheadedness with hypotension: Yes Has patient had a PCN reaction causing severe rash involving mucus membranes or skin necrosis: No Has patient had a PCN reaction that required hospitalization: No Has patient had a PCN reaction occurring within the last 10 years: No If all of the above answers are "NO", then may proceed with Cephalosporin use.       Social History   Socioeconomic History  . Marital status: Married    Spouse name: Not on file  . Number of children: 4  . Years of education: 29  . Highest education level: Not on file  Social Needs  . Financial resource strain: Not on file  . Food insecurity - worry: Not on file  . Food insecurity - inability: Not on file  . Transportation needs - medical: Not on file  . Transportation needs - non-medical: Not on file  Occupational History  . Occupation: Psychiatrist - currently unemployed  Tobacco Use  . Smoking status: Former Smoker    Packs/day: 1.00    Years: 29.00    Pack years: 29.00    Types: Cigarettes    Last attempt to quit: 03/02/2000    Years since quitting: 17.1  . Smokeless tobacco: Never Used  Substance and Sexual Activity  . Alcohol use: No    Alcohol/week: 0.0 oz  . Drug use: No  . Sexual activity: Not on file  Other Topics Concern  . Not on file  Social History Narrative   Pt lives with wife in a one story home - married for 2 years. He hunts regularly and is active, walking > 1 mile at times without difficulty.  Has 4 children.     Retired Horticulturist, commercial.  Education: some college.    Physical Exam  Constitutional: He is oriented to person, place, and time.  Cardiovascular: Normal rate and regular rhythm.  Pulmonary/Chest: Effort normal and breath sounds normal. No respiratory distress. He has no wheezes. He has no rales.  Abdominal: Soft.  Musculoskeletal: Normal range of motion. He exhibits no edema.  Neurological: He is alert and  oriented to person, place, and time.  Skin: Skin is warm and dry.  Psychiatric: He has a normal mood and affect.        Future Appointments  Date Time Provider [place]  04/21/2017 10:15 AM MC-CT 3 MC-CT [facility]  04/21/2017 12:00 PM Bensimhon, Shaune Pascal, MD MC-HVSC None  04/21/2017  2:00 PM MC VASC US 1-JULIET [place]  04/21/2017  3:00 PM MC VASC US 1-JULIET [place]  04/26/2017  9:00 AM MC-RESPTX TECH MC-RESPTX None  04/26/2017  3:20 PM CVD[facility] DEVICE REMOTES [facility]  04/27/2017  1:15 PM Posey Pronto, Arvin Collard K, DO LBN-LBNG None  04/27/2017  4:00 PM Ivin Poot, MD TCTS-CARGSO TCTSG    BP (!) 86/60 (BP Location: Right Arm, Patient Position: Sitting, Cuff Size: Large)   Pulse 70   Resp 16   Wt 212 lb (96.2 kg)   SpO2 97%   BMI 29.57 kg/m   Weight yesterday- 214 lb Last visit weight- n/a  CBG- 118 mg/dL  [name] [name] was seen at home  and reported feeling well. He denied SOB, dizziness, headache, or orthopnea. He has been compliant with his medications and denied missing any doses. His blood sugars have been very good the past week and he is changing his diet to help regulate his diabetes. He did not have any immediate concerns. They did ay that no package has been received from [place] yet. I will follow up about this at the clinic on Tuesday.   Times spent with patient: 54 minutes   Jacquiline Doe, EMT 04/16/17  ACTION: Home visit completed Next visit planned for 1 week

## 2017-04-21 ENCOUNTER — Encounter (HOSPITAL_COMMUNITY): Payer: Self-pay | Admitting: Internal Medicine

## 2017-04-21 ENCOUNTER — Ambulatory Visit (HOSPITAL_COMMUNITY)
Admission: RE | Admit: 2017-04-21 | Discharge: 2017-04-21 | Disposition: A | Discharge status: Home / Self Care | Payer: PPO | Source: Ambulatory Visit | Attending: Internal Medicine | Admitting: Internal Medicine

## 2017-04-21 ENCOUNTER — Other Ambulatory Visit (HOSPITAL_COMMUNITY): Payer: Self-pay

## 2017-04-21 ENCOUNTER — Other Ambulatory Visit: Payer: Self-pay

## 2017-04-21 ENCOUNTER — Ambulatory Visit (HOSPITAL_BASED_OUTPATIENT_CLINIC_OR_DEPARTMENT_OTHER)
Admission: RE | Admit: 2017-04-21 | Discharge: 2017-04-21 | Disposition: A | Discharge status: Home / Self Care | Payer: PPO | Source: Ambulatory Visit | Attending: Internal Medicine | Admitting: Internal Medicine

## 2017-04-21 VITALS — BP 98/70 | HR 71 | Wt 214.0 lb

## 2017-04-21 DIAGNOSIS — I472 Ventricular tachycardia: Secondary | ICD-10-CM | POA: Diagnosis not present

## 2017-04-21 DIAGNOSIS — E1121 Type 2 diabetes mellitus with diabetic nephropathy: Secondary | ICD-10-CM | POA: Diagnosis not present

## 2017-04-21 DIAGNOSIS — N183 Chronic kidney disease, stage 3 (moderate): Secondary | ICD-10-CM | POA: Diagnosis not present

## 2017-04-21 DIAGNOSIS — I480 Paroxysmal atrial fibrillation: Secondary | ICD-10-CM | POA: Insufficient documentation

## 2017-04-21 DIAGNOSIS — Z87442 Personal history of urinary calculi: Secondary | ICD-10-CM | POA: Insufficient documentation

## 2017-04-21 DIAGNOSIS — Z820 Family history of epilepsy and other diseases of the nervous system: Secondary | ICD-10-CM | POA: Insufficient documentation

## 2017-04-21 DIAGNOSIS — I509 Heart failure, unspecified: Secondary | ICD-10-CM

## 2017-04-21 DIAGNOSIS — Z8249 Family history of ischemic heart disease and other diseases of the circulatory system: Secondary | ICD-10-CM | POA: Diagnosis not present

## 2017-04-21 DIAGNOSIS — Z955 Presence of coronary angioplasty implant and graft: Secondary | ICD-10-CM | POA: Insufficient documentation

## 2017-04-21 DIAGNOSIS — Z6740 Type O blood, Rh positive: Secondary | ICD-10-CM | POA: Insufficient documentation

## 2017-04-21 DIAGNOSIS — Z7901 Long term (current) use of anticoagulants: Secondary | ICD-10-CM | POA: Diagnosis not present

## 2017-04-21 DIAGNOSIS — I5022 Chronic systolic (congestive) heart failure: Secondary | ICD-10-CM | POA: Insufficient documentation

## 2017-04-21 DIAGNOSIS — I5082 Biventricular heart failure: Secondary | ICD-10-CM | POA: Insufficient documentation

## 2017-04-21 DIAGNOSIS — Z794 Long term (current) use of insulin: Secondary | ICD-10-CM | POA: Insufficient documentation

## 2017-04-21 DIAGNOSIS — Z79899 Other long term (current) drug therapy: Secondary | ICD-10-CM | POA: Diagnosis not present

## 2017-04-21 DIAGNOSIS — R918 Other nonspecific abnormal finding of lung field: Secondary | ICD-10-CM | POA: Insufficient documentation

## 2017-04-21 DIAGNOSIS — I255 Ischemic cardiomyopathy: Secondary | ICD-10-CM | POA: Insufficient documentation

## 2017-04-21 DIAGNOSIS — E7800 Pure hypercholesterolemia, unspecified: Secondary | ICD-10-CM | POA: Insufficient documentation

## 2017-04-21 DIAGNOSIS — Z95810 Presence of automatic (implantable) cardiac defibrillator: Secondary | ICD-10-CM | POA: Insufficient documentation

## 2017-04-21 DIAGNOSIS — E11319 Type 2 diabetes mellitus with unspecified diabetic retinopathy without macular edema: Secondary | ICD-10-CM | POA: Diagnosis not present

## 2017-04-21 DIAGNOSIS — Z87891 Personal history of nicotine dependence: Secondary | ICD-10-CM | POA: Diagnosis not present

## 2017-04-21 DIAGNOSIS — I130 Hypertensive heart and chronic kidney disease with heart failure and stage 1 through stage 4 chronic kidney disease, or unspecified chronic kidney disease: Secondary | ICD-10-CM | POA: Diagnosis not present

## 2017-04-21 DIAGNOSIS — E1122 Type 2 diabetes mellitus with diabetic chronic kidney disease: Secondary | ICD-10-CM | POA: Diagnosis not present

## 2017-04-21 DIAGNOSIS — I700 Atherosclerosis of aorta: Secondary | ICD-10-CM | POA: Diagnosis not present

## 2017-04-21 DIAGNOSIS — I358 Other nonrheumatic aortic valve disorders: Secondary | ICD-10-CM | POA: Diagnosis not present

## 2017-04-21 DIAGNOSIS — Z833 Family history of diabetes mellitus: Secondary | ICD-10-CM | POA: Insufficient documentation

## 2017-04-21 DIAGNOSIS — I2510 Atherosclerotic heart disease of native coronary artery without angina pectoris: Secondary | ICD-10-CM | POA: Diagnosis not present

## 2017-04-21 DIAGNOSIS — I6523 Occlusion and stenosis of bilateral carotid arteries: Secondary | ICD-10-CM | POA: Insufficient documentation

## 2017-04-21 LAB — BASIC METABOLIC PANEL
Anion gap: 12 (ref 5–15)
BUN: 25 mg/dL — AB (ref 6–20)
CALCIUM: 9.1 mg/dL (ref 8.9–10.3)
CHLORIDE: 99 mmol/L — AB (ref 101–111)
CO2: 28 mmol/L (ref 22–32)
CREATININE: 1.09 mg/dL (ref 0.61–1.24)
GFR calc non Af Amer: >60 mL/min (ref >60)
GLUCOSE: 62 mg/dL — AB (ref 65–99)
Potassium: 3.9 mmol/L (ref 3.5–5.1)
Sodium: 139 mmol/L (ref 135–145)

## 2017-04-21 NOTE — Progress Notes (Signed)
Paramedicine Encounter   Patient ID: [name] [name] , male,   DOB: Jul 28, 1950,67 y.o.,  MRN: [id number]   [name] was seen at home today and reported feeling generally well. Doctor Bensimhon spoke more about the LVAD/transplant options and explained the number of tests that would be performed in the coming weeks. [name] was understanding and agreeable. No changes were made during this visit.   Time spent with patient: 40 minutes   Jacquiline Doe, EMT 04/21/2017   ACTION: Next visit planned for 1 week

## 2017-04-21 NOTE — Progress Notes (Signed)
Advanced Heart Failure Clinic Note     Primary Cardiologist: Dr. [name] Primary HF: Dr. [name]   HPI: [name] [name] is a 67 y.o. 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   Admitted 11/18/16 with ADHF. Echo 11/19/16 with LVEF 15-20%, Mod MR, Mod LAE, Mild RV dilation and moderately reduced function, Moderate TR, PA peak pressure 44 mm Hg. Device interrogation showed recent bout of AF but was back in NSR. Suspect AF contributing to decompensation. Work up planned for possible transplant, RV function probably will not support LVAD. Discharge weight was 225 pounds on 11/27/16.   In 12/18 he had R/L heart cath with 3v CAD and low filling pressures/low output.   Today he presents for HF follow up with his wife and daughter. In process of being worked up for advanced therapies. Paramedicine following as well. Still struggling with broken leg after he fell while "sleepwalking." Can do most ADLs without too much problem but sometimes SOB with shower. Denies CP, edema, orthopnea or PND. Weights: 212-214 lbs. Taking torsemide 50 mg 5x/week. Working on low salt and low carb diet. Drinking less than 2 L/day. Sugars remain high, Restarted trulicity. Says he was sleepwalking again this week and fell but was ok. Thinks he was getting up to go to the bathroom. Family now has bed against wall and wife sleeps between him and the floor side.   Had CT chest/abd/pelvis today. PFT's next week. Appointment with Dr Nils Pyle next week  Studies:   CPX 12/18: Resting HR: 57 Peak HR: 67  (44% age predicted max HR) BP rest: 90/64 BP peak: 78/58 Peak VO2: 9.3 (41% predicted peak VO2) VE/VCO2 slope: 53 OUES: 1.09 Peak RER: 0.90  R/L Cath 12/18:  1) LAD diffuse 60% stenosis 2) LCX multiple 40% lesions 3) RCA chronically occluded proximally with L to R collaterals  Ao =  99/56 (73) LV = 101/5 RA = 1 RV = 29/1 PA = 23/11 (16) PCW = 6 Fick cardiac output/index = 3.8/1.7 Thermo CO/CI = 4.1/1.9 PVR = 2.6 Ao sat = 99% PA sat = 60%, 61% PaPI = 12 RA/PCWP = 0.16  Review of systems complete and found to be negative unless listed in HPI.    Past Medical History:  Diagnosis Date  . AICD (automatic cardioverter/defibrillator) present 02/05/2014   Upgrade to Medtronic biventricular ICD, serial number  [id number]   . Atrial flutter ([place]) 04/2012   s/p TEE-EPS+RFCA 04/2012  . CAD (coronary artery disease) 5188,4166 X 2    RCA-T, 70% PL (off CFX), 99% Prox LAD/90% Dist LAD, S/P TAXUS stent x 2  . CHF (congestive heart failure) ([place])   . Chronic anticoagulation   . Chronic systolic heart failure ([place])   . CKD (chronic kidney disease)   . Diabetic retinopathy ([place])   . DM type 2 (diabetes mellitus, type 2) (H[facility])    insulin dependent  . HTN (hypertension)   . Hypercholesteremia    ablation  . ICD (implantable cardiac defibrillator) in place   . Ischemic cardiomyopathy March 2015   20-25% 2D   . Nephrolithiasis   . Ventricular tachycardia ([place])     Current Outpatient Medications  Medication Sig Dispense Refill  . apixaban (ELIQUIS) 5 MG TABS tablet Take 1 tablet (5 mg total) by mouth 2 (two) times daily. 60 tablet 6  .  atorvastatin (LIPITOR) 80 MG tablet TAKE ONE TABLET BY MOUTH ONCE DAILY (Patient taking differently: TAKE 80 MG BY MOUTH ONCE DAILY) 90 tablet 3  . digoxin (LANOXIN) 0.125 MG tablet Take 1 tablet (0.125 mg total) by mouth daily. 90 tablet 3  . Dulaglutide (TRULICITY) 6.29 UT/6.5YY SOPN Inject 0.75 mg into the skin once a week. 4 pen 2  . DULoxetine (CYMBALTA) 30 MG capsule Take 30 mg by mouth daily.    . insulin NPH-regular Human (NOVOLIN 70/30 RELION) (70-30) 100 UNIT/ML injection Inject 30 Units into the skin 2 (two) times daily with a meal. 10 mL 12  . losartan (COZAAR) 25 MG tablet Take 12.5 mg by mouth at bedtime.    . magnesium  oxide (MAG-OX) 400 MG tablet Take 1 tablet (400 mg total) by mouth daily. 90 tablet 3  . metFORMIN (GLUCOPHAGE-XR) 750 MG 24 hr tablet TAKE 2 TABLETS BY MOUTH ONCE DAILY WITH BREAKFAST 180 tablet 0  . Multiple Vitamins-Minerals (MULTIVITAMIN PO) Take 1 capsule by mouth daily.     Marland Kitchen OVER THE COUNTER MEDICATION Apply 1 application topically daily. Epsom Salt Foot Gel    . sotalol (BETAPACE) 120 MG tablet TAKE ONE TABLET BY MOUTH TWICE DAILY (Patient taking differently: TAKE 120MG BY MOUTH TWICE DAILY) 180 tablet 3  . torsemide (DEMADEX) 100 MG tablet Take 50 mg Once Daily only on Monday, Tuesday, Thursday, Friday, and Saturdays. 30 tablet 6   No current facility-administered medications for this encounter.     Allergies  Allergen Reactions  . Penicillins Hives, Itching and Other (See Comments)    Has patient had a PCN reaction causing immediate rash, facial/tongue/throat swelling, SOB or lightheadedness with hypotension: Yes Has patient had a PCN reaction causing severe rash involving mucus membranes or skin necrosis: No Has patient had a PCN reaction that required hospitalization: No Has patient had a PCN reaction occurring within the last 10 years: No If all of the above answers are "NO", then may proceed with Cephalosporin use.       Social History   Socioeconomic History  . Marital status: Married    Spouse name: Not on file  . Number of children: 4  . Years of education: 38  . Highest education level: Not on file  Social Needs  . Financial resource strain: Not on file  . Food insecurity - worry: Not on file  . Food insecurity - inability: Not on file  . Transportation needs - medical: Not on file  . Transportation needs - non-medical: Not on file  Occupational History  . Occupation: Psychiatrist - currently unemployed  Tobacco Use  . Smoking status: Former Smoker    Packs/day: 1.00    Years: 29.00    Pack years: 29.00    Types: Cigarettes    Last attempt to quit:  03/02/2000    Years since quitting: 17.1  . Smokeless tobacco: Never Used  Substance and Sexual Activity  . Alcohol use: No    Alcohol/week: 0.0 oz  . Drug use: No  . Sexual activity: Not on file  Other Topics Concern  . Not on file  Social History Narrative   Pt lives with wife in a one story home - married for 48 years. He hunts regularly and is active, walking > 1 mile at times without difficulty.  Has 4 children.     Retired Horticulturist, commercial.  Education: some college.   Family History  Problem Relation Age of Onset  . Heart failure Father  Deceased  . Alzheimer's disease Mother        Living  . Heart attack Mother   . CAD Brother   . CAD Brother   . CAD Brother   . CAD Brother   . Healthy Son   . Healthy Daughter   . Diabetes Brother    Vitals:   04/21/17 1233  Weight: 214 lb (97.1 kg)   Wt Readings from Last 3 Encounters:  04/21/17 214 lb (97.1 kg)  04/16/17 212 lb (96.2 kg)  04/09/17 216 lb 9.6 oz (98.2 kg)    PHYSICAL EXAM: General: In wheelchair. No resp difficulty. HEENT: Normal Neck: Supple. JVP 7-8. Carotids 2+ bilat; no bruits. No thyromegaly or nodule noted. Cor: PMI laterally displaced. RRR, No M/G/R noted Lungs: CTAB, normal effort. Abdomen: Obese, Soft, non-tender, non-distended, no HSM. No bruits or masses. +BS  Extremities: Warm. No cyanosis, clubbing, or rash. R and LLE no edema. Boot on RLE.  Neuro: Alert & orientedx3, cranial nerves grossly intact. moves all 4 extremities w/o difficulty. Affect pleasant  ASSESSMENT & PLAN: 1. Chronic systolic CHF: - Echo 1/47/82 EF 15-20% biventricular Failure with moderate RV dysfunction (likely combined ICM/NICM). He has Medtronic BiV ICD in place.  - Cath 12/18 with stable 1v CAD. Low filling pressures with CI 1.7  - CPX testing 12/18 with submax test but severe HF limitation with exertional hypotension. Peak VO2: 9.3 (41% predicted peak VO2). VE/VCO2 slope: 53 - Remains NYHA IIIb. Volume status stable. -  Continue torsemide 50 mg 5 days/week - Continue losartan 12.5. BP too low to titrate - off b-blocker due to low output and low BP and bradycardia - continue digoxin 0.125 - On sotalol for VT. Continue for now.  - Very low BP's at home with SBP 70-90's. Asymptomatic. May have to consider inotrope support in near future.  - Dr [name] met with him and his family today in conjunction with our VAD coordinators on 04/09/17. We had a long discussion about the role of advanced therapies including LVAD and transplant. We also discussed the need for better glycemic control prior to moving forward. We will begin the process and arrange for transplant evaluation at [facility] (he is blood type O). Refer to Endocrine for help with [name] control.   2. [name]  - recent cath with stable CAD as above - No s/s ischemia. Off ASA with apixaban. Continue statin   3. [name] - Recent A1c was 11.7. Consider jardiance - Restarted trulicity. - Will need to adequate control to permit advanced therapies. Refer to Endocrine  5. CKD III - Check BMET today  6. [name] - s/p ICD - Continue sotalol.   7. PAF s/p ablation 04/2012 - Regular on exam today.  - Continue Eliquis.   [name] Shore, NP 04/21/17 04/21/17  Patient seen and examined with the above-signed Advanced Practice Provider and/or Housestaff. I personally reviewed laboratory data, imaging studies and relevant notes. I independently examined the patient and formulated the important aspects of the plan. I have edited the note to reflect any of my changes or salient points. I have personally discussed the plan with the patient and/or family.  Stable NYHA IIIB symptoms in setting of severe combined ICM/NICM. CPX with severe HF deficit. Long discussion with him and his family about advanced therapies and they are eager to proceed. Will need better glycemic control Refer to endocrine. BP too low to titrate HF meds at this time. Will continue with  pre-transplant w/u and send packet to [facility] to assess. Blood type is O so may need bridge VAD.   Total time spent 45 minutes. Over half that time spent discussing above.   Glori Bickers, MD  12:13 AM

## 2017-04-21 NOTE — Patient Instructions (Signed)
Lab today  You have been referred to Dr Renne Crigler for diabetes management  Your physician recommends that you schedule a follow-up appointment in: 4 weeks

## 2017-04-21 NOTE — Progress Notes (Signed)
Carotid duplex prelim: 1-39% ICA stenosis. ABI prelim: Right WNL, Left indicates moderate disease. Appears the same as ABI from 05/2016. LE venous duplex prelim: negative for DVT. Landry Mellow, RDMS, RVT

## 2017-04-23 ENCOUNTER — Telehealth (HOSPITAL_COMMUNITY): Payer: Self-pay | Admitting: Unknown Physician Specialty

## 2017-04-23 NOTE — Telephone Encounter (Signed)
Pt's daughter called this morning with questions regarding IV inotropes. This was mentioned in their first encounter with the VAD team. She was educated on the need for IV inotropes and how it specifically relates to Mr. Edelson if the need would ever arise. The daughter also states that the pt has some swelling in his left foot and is asking if this came from the Oral contrast that the pt had to drink for his CT scans on Wednesday. Pt has been up a lot going to different tests for his LVAD evaluation. Pt/caregiver instructed to take his fluid pill today as scheduled and try to elevate his legs. Pt denies any other symptoms. Pt is scheduled for PFTs on Monday. All their questions were answered regarding VAD evaluation and IV inotropes today.  Tanda Rockers RN, BSN VAD Coordinator 24/7 Pager [phone]

## 2017-04-26 ENCOUNTER — Encounter (HOSPITAL_COMMUNITY): Payer: Self-pay | Admitting: *Deleted

## 2017-04-26 ENCOUNTER — Ambulatory Visit (INDEPENDENT_AMBULATORY_CARE_PROVIDER_SITE_OTHER): Payer: PPO

## 2017-04-26 ENCOUNTER — Telehealth: Payer: Self-pay

## 2017-04-26 ENCOUNTER — Ambulatory Visit (HOSPITAL_COMMUNITY): Payer: PPO

## 2017-04-26 DIAGNOSIS — Z95810 Presence of automatic (implantable) cardiac defibrillator: Secondary | ICD-10-CM | POA: Diagnosis not present

## 2017-04-26 DIAGNOSIS — I5022 Chronic systolic (congestive) heart failure: Secondary | ICD-10-CM | POA: Diagnosis not present

## 2017-04-26 NOTE — Progress Notes (Signed)
EPIC Encounter for ICM Monitoring  Patient Name: [name] [name] is a 67 y.o. male Date: 04/26/2017 Primary Care Physican: [name], NP Primary Cardiologist:[name] Electrophysiologist: [name] Dry Weight: Previous weight 220 lbs (ranges216-220 lbs) Bi-V Pacing: 89.1%       Attempted call to patient and unable to reach.   Transmission reviewed.   Dr [name] has discussed advanced therapies with patient.    Thoracic impedance abnormal suggesting fluid accumulation since 04/16/2017.  Prescribed dosage: Torsemide 100 mg take 50 mg Once Daily only on Monday, Tuesday, Thursday, Friday, and Saturdays.  Labs: 03/19/2017 Creatinine 1.34, BUN 30, Potassium 5.2, Sodium 137, EGFR 53->60 02/17/2017 Creatinine 1.44, BUN 22, Potassium 4.5, Sodium 128, EGFR 49-57  02/05/2017 Creatinine 1.37, BUN 29, Potassium 4.9, Sodium 134, EGFR 52->60  01/27/2017 Creatinine 1.47, BUN 44, Potassium 4.7, Sodium 132, EGFR 48-56  01/15/2017 Creatinine 1.13, BUN 25, Potassium 4.7, Sodium 131, EGFR >60  12/04/2016 Creatinine 1.32, BUN 33, Potassium 4.1, Sodium 136, EGFR 55->60 11/27/2016 Creatinine 1.36, BUN 45, Potassium 3.6, Sodium 133, EGFR 53->60 11/26/2016 Creatinine 1.38, BUN 39, Potassium 4.3, Sodium 133, EGFR 52-60  11/25/2016 Creatinine 1.26, BUN 38, Potassium 3.0, Sodium 135, EGFR 58->60  11/24/2016 Creatinine 1.33, BUN 39, Potassium 3.2, Sodium 133, EGFR 54->60  11/23/2016 Creatinine 1.38, BUN 36, Potassium 2.8, Sodium 138, EGFR 52-60  11/22/2016 Creatinine 1.48, BUN 36, Potassium 3.5, Sodium 134, EGFR 48-55  11/21/2016 Creatinine 1.37, BUN 33, Potassium 3.4, Sodium 136, EGFR 52->60  11/20/2016 Creatinine 1.44, BUN 31, Potassium 4.3, Sodium 140, EGFR 49-57  11/19/2016 Creatinine 1.31, BUN 129, Potassium 4.4, Sodium 137, EGFR 55->60  11/18/2016 Creatinine 1.38, BUN 31, Potassium 3.7, Sodium 136, EGFR 52-60  11/12/2016 Creatinine 1.49, BUN 30, Potassium 4.2, Sodium  138 06/02/2016 Creatinine 1.16, BUN 18, Potassium 4.7, Sodium 137 04/06/2016 Creatinine 0.92, BUN 19, Potassium 4.2, Sodium 138 09/25/2017Creatinine 1.31, BUN 19, Potassium 5.0, Sodium 134  Recommendations:  NONE - Unable to reach.  Follow-up plan: ICM clinic phone appointment on 05/03/2017 to recheck fluid levels.    Copy of ICM check sent to Dr. [name] and Dr. [name].   3 month ICM trend: 04/26/2017    1 Year ICM trend:       [name], RN 04/26/2017 10:52 AM

## 2017-04-26 NOTE — Telephone Encounter (Signed)
Remote ICM transmission received.  Attempted call to patient and no message. 

## 2017-04-26 NOTE — Progress Notes (Signed)
Pt and wife presented for PFT's late; wife says she got time wrong. PFT lab re-scheduled for tomorrow at 2 pm.  Patient reports wt gain of 9 lbs and increased lower extremity edema over last few days. Pt and wife are wondering if they should be seen in clinic today. Pt is currently taking Torsemide 50 mg daily except Wed/Sun.  Dr. [name] updated - instructed pt to take Torsemide 100 mg today and tomorrow and call if symptoms increase or do not improve. Pt and wife verbalized understanding of same.

## 2017-04-27 ENCOUNTER — Ambulatory Visit: Payer: PPO | Admitting: Neurology

## 2017-04-27 ENCOUNTER — Other Ambulatory Visit: Payer: Self-pay

## 2017-04-27 ENCOUNTER — Telehealth: Payer: Self-pay | Admitting: Licensed Clinical Social Worker

## 2017-04-27 ENCOUNTER — Encounter: Payer: Self-pay | Admitting: Cardiothoracic Surgery

## 2017-04-27 ENCOUNTER — Ambulatory Visit (HOSPITAL_COMMUNITY)
Admission: RE | Admit: 2017-04-27 | Discharge: 2017-04-27 | Disposition: A | Discharge status: Home / Self Care | Payer: PPO | Source: Ambulatory Visit | Attending: Internal Medicine | Admitting: Internal Medicine

## 2017-04-27 ENCOUNTER — Institutional Professional Consult (permissible substitution): Payer: PPO | Admitting: Cardiothoracic Surgery

## 2017-04-27 VITALS — BP 120/70 | HR 52 | Resp 20 | Ht 71.0 in | Wt 222.0 lb

## 2017-04-27 DIAGNOSIS — I5022 Chronic systolic (congestive) heart failure: Secondary | ICD-10-CM | POA: Diagnosis not present

## 2017-04-27 DIAGNOSIS — I509 Heart failure, unspecified: Secondary | ICD-10-CM | POA: Diagnosis not present

## 2017-04-27 DIAGNOSIS — J449 Chronic obstructive pulmonary disease, unspecified: Secondary | ICD-10-CM | POA: Insufficient documentation

## 2017-04-27 DIAGNOSIS — E1169 Type 2 diabetes mellitus with other specified complication: Secondary | ICD-10-CM

## 2017-04-27 DIAGNOSIS — E669 Obesity, unspecified: Secondary | ICD-10-CM

## 2017-04-27 LAB — PULMONARY FUNCTION TEST
DL/VA % pred: 58 %
DL/VA: 2.74 ml/min/mmHg/L
DLCO UNC % PRED: 41 %
DLCO UNC: 13.87 ml/min/mmHg
FEF 25-75 PRE: 2.5 L/s
FEF 25-75 Post: 2.99 L/sec
FEF2575-%CHANGE-POST: 19 %
FEF2575-%PRED-PRE: 92 %
FEF2575-%Pred-Post: 110 %
FEV1-%Change-Post: 3 %
FEV1-%PRED-POST: 88 %
FEV1-%Pred-Pre: 85 %
FEV1-Post: 2.73 L
FEV1-Pre: 2.64 L
FEV1FVC-%Change-Post: 2 %
FEV1FVC-%Pred-Pre: 103 %
FEV6-%CHANGE-POST: 1 %
FEV6-%PRED-PRE: 85 %
FEV6-%Pred-Post: 86 %
FEV6-Post: 3.35 L
FEV6-Pre: 3.3 L
FEV6FVC-%Change-Post: 0 %
FEV6FVC-%Pred-Post: 104 %
FEV6FVC-%Pred-Pre: 103 %
FVC-%CHANGE-POST: 1 %
FVC-%Pred-Post: 83 %
FVC-%Pred-Pre: 81 %
FVC-Post: 3.35 L
FVC-Pre: 3.31 L
POST FEV1/FVC RATIO: 81 %
POST FEV6/FVC RATIO: 100 %
PRE FEV1/FVC RATIO: 80 %
Pre FEV6/FVC Ratio: 100 %
RV % pred: 94 %
RV: 2.31 L
TLC % pred: 79 %
TLC: 5.77 L

## 2017-04-27 MED ORDER — ALBUTEROL SULFATE (2.5 MG/3ML) 0.083% IN NEBU
2.5000 mg | INHALATION_SOLUTION | Freq: Once | RESPIRATORY_TRACT | Status: AC
  Administered 2017-04-27: 2.5 mg via RESPIRATORY_TRACT

## 2017-04-27 NOTE — Progress Notes (Signed)
PCP is [name], NP Referring Provider is Bensimhon, Shaune Pascal, MD  Chief Complaint  Patient presents with  . Congestive Heart Failure    Surgical eval for LVAD, Cadiac Cath 02/18/17, Cardiopulmonary exercise test 02/17/17   Patient examined, most recent coronary angiogram and echocardiogram as well as CT scan of chest and PFT results personally reviewed and counseled with patient and wife  HPI: Very nice 67 year old  AA diabetic male reformed smoker and retired Horticulturist, commercial.  Patient has been followed for several months in the advanced heart failure clinic for ischemic cardiomyopathy.  Ejection fraction 15-20%.  CPX testing shows peak VO2 9.2.  Patient was admitted with heart failure in September.  It was felt he developed atrial fibrillation which probably precipitated his clinical deterioration.  He has been on anticoagulation since that time without bleeding complications-Eliquis.  He underwent an echocardiogram at that time --severe LV dilatation with EF 15-20%, moderate MR, moderate TR, and moderate RV dysfunction.  In December the patient underwent right heart cath which demonstrated low right-sided pressures, low cardiac output.  Home IV inotropes were considered but the patient preferred to continue with oral medication and has done fairly well until he fell while sleepwalking and broke his right ankle.  Patient is non-weightbearing at the current time and has been wearing a lower leg brace for 6 weeks.  This patient is start of the evaluation process for possible implantable destination therapy left ventricular assist device.  CT scan of chest shows cardiomegaly but no pulmonary nodules or abnormal adenopathy.  Ascending aortic size is satisfactory.  CT scan of the abdomen showed no significant abnormality of the liver  The patient required AICD implantation 15 years ago and recently had a new generator replaced.  There have been no shocks in the past 3-5 years.  Patient's  diabetes has been poorly controlled with last A1c of 11.1.  The patient is now more diligent about checking his blood sugars and complying with diet and medications.  Consultation with endocrinology is pending.   Past Medical History:  Diagnosis Date  . AICD (automatic cardioverter/defibrillator) present 02/05/2014   Upgrade to Medtronic biventricular ICD, serial number  [id number]   . Atrial flutter ([place]) 04/2012   s/p TEE-EPS+RFCA 04/2012  . CAD (coronary artery disease) 7846,9629 X 2    RCA-T, 70% PL (off CFX), 99% Prox LAD/90% Dist LAD, S/P TAXUS stent x 2  . CHF (congestive heart failure) ([place])   . Chronic anticoagulation   . Chronic systolic heart failure ([place])   . CKD (chronic kidney disease)   . Diabetic retinopathy ([place])   . DM type 2 (diabetes mellitus, type 2) (H[facility])    insulin dependent  . HTN (hypertension)   . Hypercholesteremia    ablation  . ICD (implantable cardiac defibrillator) in place   . Ischemic cardiomyopathy March 2015   20-25% 2D   . Nephrolithiasis   . Ventricular tachycardia [facility])     Past Surgical History:  Procedure Laterality Date  . ATRIAL FLUTTER ABLATION N/A 05/19/2012   Procedure: ATRIAL FLUTTER ABLATION;  Surgeon: Thompson Grayer, MD;  Location: [facility] CATH LAB;  Service: Cardiovascular;  Laterality: N/A;  . BI-VENTRICULAR IMPLANTABLE CARDIOVERTER DEFIBRILLATOR UPGRADE N/A 02/05/2014   Procedure: BI-VENTRICULAR IMPLANTABLE CARDIOVERTER DEFIBRILLATOR UPGRADE;  Surgeon: Evans Lance, MD;  Location: [facility] CATH LAB;  Service: Cardiovascular;  Laterality: N/A;  . BIV ICD GENERTAOR CHANGE OUT  02/05/2014   Upgrade to Medtronic biventricular ICD, serial number  [id number] by  Dr. [name]  . CARDIAC DEFIBRILLATOR PLACEMENT  2007    Medtronic Maximo VR, serial number [id number]  . PERCUTANEOUS CORONARY STENT INTERVENTION (PCI-S)  January 2002   PTCA/Stent Distal RCA  . PERCUTANEOUS CORONARY STENT INTERVENTION (PCI-S)  June 2002   PTCA/Stent x 3 RCA,  thrombolysis - failed  . PERCUTANEOUS CORONARY STENT INTERVENTION (PCI-S)  July 2006   TAXUS stents to prox and distal LAD  . RIGHT/LEFT HEART CATH AND CORONARY ANGIOGRAPHY N/A 02/18/2017   Procedure: RIGHT/LEFT HEART CATH AND CORONARY ANGIOGRAPHY;  Surgeon: Jolaine Artist, MD;  Location: [place] CV LAB;  Service: Cardiovascular;  Laterality: N/A;    Family History  Problem Relation Age of Onset  . Heart failure Father        Deceased  . Alzheimer's disease Mother        Living  . Heart attack Mother   . CAD Brother   . CAD Brother   . CAD Brother   . CAD Brother   . Healthy Son   . Healthy Daughter   . Diabetes Brother     Social History Social History   Tobacco Use  . Smoking status: Former Smoker    Packs/day: 1.00    Years: 29.00    Pack years: 29.00    Types: Cigarettes    Last attempt to quit: 03/02/2000    Years since quitting: 17.1  . Smokeless tobacco: Never Used  Substance Use Topics  . Alcohol use: No    Alcohol/week: 0.0 oz  . Drug use: No    Current Outpatient Medications  Medication Sig Dispense Refill  . apixaban (ELIQUIS) 5 MG TABS tablet Take 1 tablet (5 mg total) by mouth 2 (two) times daily. 60 tablet 6  . atorvastatin (LIPITOR) 80 MG tablet TAKE ONE TABLET BY MOUTH ONCE DAILY (Patient taking differently: TAKE 80 MG BY MOUTH ONCE DAILY) 90 tablet 3  . digoxin (LANOXIN) 0.125 MG tablet Take 1 tablet (0.125 mg total) by mouth daily. 90 tablet 3  . Dulaglutide (TRULICITY) 9.38 BO/1.7PZ SOPN Inject 0.75 mg into the skin once a week. 4 pen 2  . DULoxetine (CYMBALTA) 30 MG capsule Take 30 mg by mouth daily.    . insulin NPH-regular Human (NOVOLIN 70/30 RELION) (70-30) 100 UNIT/ML injection Inject 30 Units into the skin 2 (two) times daily with a meal. 10 mL 12  . losartan (COZAAR) 25 MG tablet Take 12.5 mg by mouth at bedtime.    . magnesium oxide (MAG-OX) 400 MG tablet Take 1 tablet (400 mg total) by mouth daily. 90 tablet 3  . metFORMIN  (GLUCOPHAGE-XR) 750 MG 24 hr tablet TAKE 2 TABLETS BY MOUTH ONCE DAILY WITH BREAKFAST 180 tablet 0  . Multiple Vitamins-Minerals (MULTIVITAMIN PO) Take 1 capsule by mouth daily.     Marland Kitchen OVER THE COUNTER MEDICATION Apply 1 application topically daily. Epsom Salt Foot Gel    . sotalol (BETAPACE) 120 MG tablet TAKE ONE TABLET BY MOUTH TWICE DAILY (Patient taking differently: TAKE 120MG  BY MOUTH TWICE DAILY) 180 tablet 3  . torsemide (DEMADEX) 100 MG tablet Take 50 mg Once Daily only on Monday, Tuesday, Thursday, Friday, and Saturdays. 30 tablet 6   No current facility-administered medications for this visit.     Allergies  Allergen Reactions  . Penicillins Hives, Itching and Other (See Comments)    Has patient had a PCN reaction causing immediate rash, facial/tongue/throat swelling, SOB or lightheadedness with hypotension: Yes Has patient had a PCN reaction causing  severe rash involving mucus membranes or skin necrosis: No Has patient had a PCN reaction that required hospitalization: No Has patient had a PCN reaction occurring within the last 10 years: No If all of the above answers are "NO", then may proceed with Cephalosporin use.     Review of Systems   Slight weight gain associated with recent scans to evaluate for possible VAD implant  Patient is edentulous without dental complaints or swallowing disorder  No recent upper respiratory infection symptoms  Review of systems otherwise negative except for issues mentioned in present illness  BP 120/70   Pulse (!) 52   Resp 20   Ht 5\' 11"  (1.803 m)   Wt 222 lb (100.7 kg)   SpO2 98% Comment: RA  BMI 30.96 kg/m  Physical Exam     Physical Exam  General: Obese middle-aged AA male no acute distress HEENT: Normocephalic pupils equal , dentition adequate-edentulous Neck: Supple without JVD, adenopathy, or bruit Chest: Clear to auscultation, symmetrical breath sounds, no rhonchi, no tenderness             or  deformity Cardiovascular: Regular rate and rhythm, no murmur, no gallop, peripheral pulses             palpable in all extremities Abdomen:  Soft, nontender, no palpable mass or organomegaly Extremities: Warm, well-perfused, no clubbing cyanosis or tenderness, mild-moderate ankle edema              no venous stasis changes of the legs Rectal/GU: Deferred Neuro: Grossly non--focal and symmetrical throughout Skin: Clean and dry without rash or ulceration   Diagnostic Tests: PFTs show adequate spirometry, diffusing capacity is low at 41%.  Patient not requiring home oxygen and oxygen saturation room air is adequate at 98%.  Echocardiogram shows moderate RV dysfunction, Tapsi is low at 11.1 however CVP and right-sided pressures are satisfactory for implantable VAD therapy  Impression: Patient appears to be a potential candidate for destination therapy LVAD however patient is not a satisfactory candidate currently because he is nonambulatory and will take several weeks to recover his strength after he can start walking in the next 2 weeks.  Patient also should have a documented improvement in his diabetic control with A1c less than 10 before VAD implantation  Plan: We will follow with heart failure team as the patient is prepared for possible VAD implant.  He will also be assessed for possible transplantation at [facility].   Len Childs, MD [facility] [phone]

## 2017-04-27 NOTE — Telephone Encounter (Signed)
CSW contacted patient to schedule LVAD assessment. Due to transportation patient is not available until Monday May 10, 2017 @ 10am. CSW will complete LVAD assessment at that time. Raquel Sarna, [employer], [place]

## 2017-04-30 ENCOUNTER — Telehealth (HOSPITAL_COMMUNITY): Payer: Self-pay | Admitting: *Deleted

## 2017-04-30 ENCOUNTER — Other Ambulatory Visit (HOSPITAL_COMMUNITY): Payer: Self-pay

## 2017-04-30 ENCOUNTER — Encounter (HOSPITAL_COMMUNITY): Payer: Self-pay | Admitting: Unknown Physician Specialty

## 2017-04-30 NOTE — Telephone Encounter (Signed)
Zach with paramedicine called to report that since patient had taken extra torsemide on Wednesday and Thursday (100 mg for 2 days) his weight came down 3 lbs, today it is 220 lbs.  Patient isn't having any shortness of breath or chest pain but does have swelling around ankles.  BP today is 88/42, patient's BP runs soft normally.  Since patient's weight is trending down and he isn't complaining of any symptoms, I advised Zack to educate patient on elevated feet, watch sodium intake, and call us on Monday if his weight increases or becomes symptomatic.  No medications changes at this time.

## 2017-04-30 NOTE — Progress Notes (Signed)
Paramedicine Encounter    Patient ID: [name] [name], male    DOB: [birth year], 67 y.o.   MRN: [id number]   Patient Care Team: [name], NP as PCP - General (Nurse Practitioner)  Patient Active Problem List   Diagnosis Date Noted  . Acute on chronic combined systolic and diastolic CHF (congestive heart failure) ([place]) 11/19/2016  . Acute on chronic left and right systolic w/ diastolic heart failure, NYHA class 2 ([place]) 11/18/2016  . Hx of atrial flutter 11/18/2016  . CAD (coronary artery disease)-RCA-T, 70% PL (off CFX), 99% Prox LAD/90% Dist LAD, S/P TAXUS stent x 2 11/18/2016  . HTN (hypertension) 11/18/2016  . Diabetes mellitus with complication ([place]) 40/98/1191  . Thrombocytopenia ([place]) 11/18/2016  . Bilateral lower extremity edema 11/12/2016  . Diabetic polyneuropathy associated with diabetes mellitus due to underlying condition ([place]) 05/29/2015  . Chronic systolic heart failure ([place]) 02/05/2014  . Medtronic biventricular ICD, serial number  BLD [id number]  02/05/2014  . CAD (coronary artery disease), native coronary artery 01/08/2014  . Chronic renal disease, stage III ([place]) 01/08/2014  . Heart failure ([place]) 12/16/2013  . Mitral regurgitation 04/17/2013  . Chronic systolic dysfunction of left ventricle 06/29/2012  . Atrial flutter ([place]) 06/03/2012  . Acute kidney injury (nontraumatic) ([place]) 05/31/2012  . Chronic anticoagulation   . Acute on chronic systolic heart failure ([place]) 05/21/2012  . Nausea with vomiting 05/18/2012  . PAF (paroxysmal atrial fibrillation) ([place]) 05/17/2012  . Orthopnea 05/17/2012  . TIA (transient ischemic attack) 05/29/2010  . CHEST PAIN 11/13/2008  . VENTRICULAR TACHYCARDIA 08/14/2008  . Automatic implantable cardioverter-defibrillator in situ 08/14/2008  . Type 2 diabetes with nephropathy ([place]) 08/11/2008  . DYSLIPIDEMIA 08/11/2008  . Cardiomyopathy, ischemic 08/11/2008  . Disorder resulting from impaired renal function 08/11/2008  .  HYPERCHOLESTEROLEMIA 04/29/2006  . HYPERTENSION, BENIGN SYSTEMIC 04/29/2006  . MYOCARDIAL INFARCTION, OLD 04/29/2006  . Coronary atherosclerosis 04/29/2006  . NEPHROLITHIASIS 04/29/2006    Current Outpatient Medications:  .  apixaban (ELIQUIS) 5 MG TABS tablet, Take 1 tablet (5 mg total) by mouth 2 (two) times daily., Disp: 60 tablet, Rfl: 6 .  atorvastatin (LIPITOR) 80 MG tablet, TAKE ONE TABLET BY MOUTH ONCE DAILY (Patient taking differently: TAKE 80 MG BY MOUTH ONCE DAILY), Disp: 90 tablet, Rfl: 3 .  digoxin (LANOXIN) 0.125 MG tablet, Take 1 tablet (0.125 mg total) by mouth daily., Disp: 90 tablet, Rfl: 3 .  Dulaglutide (TRULICITY) 4.78 GN/5.6OZ SOPN, Inject 0.75 mg into the skin once a week., Disp: 4 pen, Rfl: 2 .  DULoxetine (CYMBALTA) 30 MG capsule, Take 30 mg by mouth daily., Disp: , Rfl:  .  insulin NPH-regular Human (NOVOLIN 70/30 RELION) (70-30) 100 UNIT/ML injection, Inject 30 Units into the skin 2 (two) times daily with a meal., Disp: 10 mL, Rfl: 12 .  losartan (COZAAR) 25 MG tablet, Take 12.5 mg by mouth at bedtime., Disp: , Rfl:  .  magnesium oxide (MAG-OX) 400 MG tablet, Take 1 tablet (400 mg total) by mouth daily., Disp: 90 tablet, Rfl: 3 .  metFORMIN (GLUCOPHAGE-XR) 750 MG 24 hr tablet, TAKE 2 TABLETS BY MOUTH ONCE DAILY WITH BREAKFAST, Disp: 180 tablet, Rfl: 0 .  Multiple Vitamins-Minerals (MULTIVITAMIN PO), Take 1 capsule by mouth daily. , Disp: , Rfl:  .  OVER THE COUNTER MEDICATION, Apply 1 application topically daily. Epsom Salt Foot Gel, Disp: , Rfl:  .  sotalol (BETAPACE) 120 MG tablet, TAKE ONE TABLET BY MOUTH TWICE DAILY (Patient taking differently: TAKE 120MG  BY  MOUTH TWICE DAILY), Disp: 180 tablet, Rfl: 3 .  torsemide (DEMADEX) 100 MG tablet, Take 50 mg Once Daily only on Monday, Tuesday, Thursday, Friday, and Saturdays., Disp: 30 tablet, Rfl: 6 Allergies  Allergen Reactions  . Penicillins Hives, Itching and Other (See Comments)    Has patient had a PCN reaction  causing immediate rash, facial/tongue/throat swelling, SOB or lightheadedness with hypotension: Yes Has patient had a PCN reaction causing severe rash involving mucus membranes or skin necrosis: No Has patient had a PCN reaction that required hospitalization: No Has patient had a PCN reaction occurring within the last 10 years: No If all of the above answers are "NO", then may proceed with Cephalosporin use.       Social History   Socioeconomic History  . Marital status: Married    Spouse name: Not on file  . Number of children: 4  . Years of education: 61  . Highest education level: Not on file  Social Needs  . Financial resource strain: Not on file  . Food insecurity - worry: Not on file  . Food insecurity - inability: Not on file  . Transportation needs - medical: Not on file  . Transportation needs - non-medical: Not on file  Occupational History  . Occupation: Psychiatrist - currently unemployed  Tobacco Use  . Smoking status: Former Smoker    Packs/day: 1.00    Years: 29.00    Pack years: 29.00    Types: Cigarettes    Last attempt to quit: 03/02/2000    Years since quitting: 17.1  . Smokeless tobacco: Never Used  Substance and Sexual Activity  . Alcohol use: No    Alcohol/week: 0.0 oz  . Drug use: No  . Sexual activity: Not on file  Other Topics Concern  . Not on file  Social History Narrative   Pt lives with wife in a one story home - married for 40 years. He hunts regularly and is active, walking > 1 mile at times without difficulty.  Has 4 children.     Retired Horticulturist, commercial.  Education: some college.    Physical Exam  Constitutional: He is oriented to person, place, and time.  Cardiovascular: Normal rate and regular rhythm.  Pulmonary/Chest: Effort normal and breath sounds normal. No respiratory distress. He has no wheezes. He has no rales.  Abdominal: Soft.  Musculoskeletal: Normal range of motion. He exhibits edema.  Neurological: He is alert and oriented  to person, place, and time.  Skin: Skin is warm and dry.  Psychiatric: He has a normal mood and affect.        Future Appointments  Date Time Provider [place]  05/03/2017  4:40 PM [facility] DEVICE REMOTES [facility]  05/19/2017  9:30 AM [facility] PA/NP [facility] None  06/03/2017  8:45 AM Elayne Snare, MD [facility] None  08/06/2017  8:00 AM Posey Pronto, Donika K, DO [facility] None    BP (!) 88/42 (BP Location: Left Arm, Patient Position: Sitting, Cuff Size: Large)   Pulse 62   Resp 16   Wt 220 lb (99.8 kg)   SpO2 98%   BMI 30.68 kg/m   Weight yesterday- 220 lb Last visit weight- 214 lb   Mr Frampton was seen at home today and reported feeling well. His weight has increased significantly since last week and he believes it is a result of drinking the contrast dye for his CT last week. He denied SOB, headache, dizziness or orthopnea. He said he has not had any  more falls in the past week. I contacted the clinic regarding his weight gain and low blood pressure and they advised that no action was required at this time but to have him contact the clinic next week if his weight was not decreasing. I will follow up early next week via phone to check on his status.   Time spent with patient: 34 minutes  Jacquiline Doe, EMT 04/30/17  ACTION: Home visit completed Next visit planned for 1 week

## 2017-04-30 NOTE — Progress Notes (Signed)
Heart Transplant referral packet faxed to [place] with confirmation received.    Tanda Rockers RN, BSN VAD Coordinator 24/7 Pager [phone]

## 2017-05-03 ENCOUNTER — Ambulatory Visit (INDEPENDENT_AMBULATORY_CARE_PROVIDER_SITE_OTHER): Payer: Self-pay

## 2017-05-03 ENCOUNTER — Telehealth: Payer: Self-pay

## 2017-05-03 DIAGNOSIS — Z95810 Presence of automatic (implantable) cardiac defibrillator: Secondary | ICD-10-CM

## 2017-05-03 DIAGNOSIS — I5022 Chronic systolic (congestive) heart failure: Secondary | ICD-10-CM

## 2017-05-03 NOTE — Progress Notes (Signed)
EPIC Encounter for ICM Monitoring  Patient Name: [name] [name] is a 67 y.o. male Date: 05/03/2017 Primary Care Physican: [name], NP Primary Cardiologist:[name] Electrophysiologist: [name] Dry Weight: Previous weight 220 lbs (ranges216-220 lbs) Bi-V Pacing: 91.5%      Attempted call to patient and unable to reach.  Left message to return call regarding transmission.  Transmission reviewed.   Per phone note 3/1, paramedicine contacted Dr Bensimhon's office to inform patient has gained weight.  Patient took extra Torsemide on 04/29/2107 and weight decreased by 3 lbs.    Thoracic impedance abnormal suggesting fluid accumulation since 04/15/2017.  Prescribed dosage: Torsemide 100 mgtake 50 mg Once Daily only on Monday, Tuesday, Thursday, Friday, and Saturdays.  Labs: 03/19/2017 Creatinine1.34, BUN30, Potassium5.2, Sodium137, EGFR53->60 02/17/2017 Creatinine1.44, BUN22, Potassium4.5, [id number], [id number]  02/05/2017 Creatinine1.37, BUN29, Potassium4.9, Sodium134, EGFR52->60  01/27/2017 Creatinine1.47, BUN44, Potassium4.7, Sodium132, [id number]  01/15/2017 Creatinine1.13, BUN25, Potassium4.7, Sodium131, EGFR>60 12/04/2016 Creatinine 1.32, BUN 33, Potassium 4.1, Sodium 136, EGFR 55->60 11/27/2016 Creatinine 1.36, BUN 45, Potassium 3.6, Sodium 133, EGFR 53->60 11/26/2016 Creatinine 1.38, BUN 39, Potassium 4.3, Sodium 133, EGFR 52-60  11/25/2016 Creatinine 1.26, BUN 38, Potassium 3.0, Sodium 135, EGFR 58->60  11/24/2016 Creatinine 1.33, BUN 39, Potassium 3.2, Sodium 133, EGFR 54->60  11/23/2016 Creatinine 1.38, BUN 36, Potassium 2.8, Sodium 138, EGFR 52-60  11/22/2016 Creatinine 1.48, BUN 36, Potassium 3.5, Sodium 134, EGFR 48-55  11/21/2016 Creatinine 1.37, BUN 33, Potassium 3.4, Sodium 136, EGFR 52->60  11/20/2016 Creatinine 1.44, BUN 31, Potassium 4.3, Sodium 140, EGFR 49-57  11/19/2016 Creatinine 1.31, BUN 129, Potassium 4.4,  Sodium 137, EGFR 55->60  11/18/2016 Creatinine 1.38, BUN 31, Potassium 3.7, Sodium 136, EGFR 52-60  11/12/2016 Creatinine 1.49, BUN 30, Potassium 4.2, Sodium 138 06/02/2016 Creatinine 1.16, BUN 18, Potassium 4.7, Sodium 137 04/06/2016 Creatinine 0.92, BUN 19, Potassium 4.2, Sodium 138 09/25/2017Creatinine 1.31, BUN 19, Potassium 5.0, Sodium 134  Recommendations: NONE - Unable to reach.  Follow-up plan: ICM clinic phone appointment on 05/06/2017 (manual send).  Office appointment scheduled 05/19/2017 with HF NP/PA.  Copy of ICM check sent to Dr. [name] and Dr. [name].   3 month ICM trend: 05/03/2017    1 Year ICM trend:       [name], RN 05/03/2017 9:19 AM

## 2017-05-03 NOTE — Telephone Encounter (Signed)
Remote ICM transmission received.  Attempted call to patient and spoke with wife.  She was not with patient but advised to call home number.  Attempted home number and left message to return call regarding transmission.

## 2017-05-04 ENCOUNTER — Other Ambulatory Visit (HOSPITAL_COMMUNITY): Payer: Self-pay | Admitting: *Deleted

## 2017-05-04 DIAGNOSIS — S8264XD Nondisplaced fracture of lateral malleolus of right fibula, subsequent encounter for closed fracture with routine healing: Secondary | ICD-10-CM | POA: Diagnosis not present

## 2017-05-04 DIAGNOSIS — I509 Heart failure, unspecified: Secondary | ICD-10-CM

## 2017-05-07 ENCOUNTER — Other Ambulatory Visit (HOSPITAL_COMMUNITY): Payer: Self-pay

## 2017-05-07 NOTE — Progress Notes (Signed)
Paramedicine Encounter    Patient ID: [name] [name], male    DOB: 06/26/1950, 67 y.o.   MRN: [id number]   Patient Care Team: [name] Sell, NP as PCP - General (Nurse Practitioner)  Patient Active Problem List   Diagnosis Date Noted  . Acute on chronic combined systolic and diastolic CHF (congestive heart failure) ([place]) 11/19/2016  . Acute on chronic left and right systolic w/ diastolic heart failure, NYHA class 2 ([place]) 11/18/2016  . Hx of atrial flutter 11/18/2016  . CAD (coronary artery disease)-RCA-T, 70% PL (off CFX), 99% Prox LAD/90% Dist LAD, S/P TAXUS stent x 2 11/18/2016  . HTN (hypertension) 11/18/2016  . Diabetes mellitus with complication ([place]) 94/70/9628  . Thrombocytopenia ([place]) 11/18/2016  . Bilateral lower extremity edema 11/12/2016  . Diabetic polyneuropathy associated with diabetes mellitus due to underlying condition ([place]) 05/29/2015  . Chronic systolic heart failure ([place]) 02/05/2014  . Medtronic biventricular ICD, serial number  [id number]  02/05/2014  . CAD (coronary artery disease), native coronary artery 01/08/2014  . Chronic renal disease, stage III ([place]) 01/08/2014  . Heart failure ([place]) 12/16/2013  . Mitral regurgitation 04/17/2013  . Chronic systolic dysfunction of left ventricle 06/29/2012  . Atrial flutter ([place]) 06/03/2012  . Acute kidney injury (nontraumatic) ([place]) 05/31/2012  . Chronic anticoagulation   . Acute on chronic systolic heart failure ([place]) 05/21/2012  . Nausea with vomiting 05/18/2012  . PAF (paroxysmal atrial fibrillation) ([place]) 05/17/2012  . Orthopnea 05/17/2012  . TIA (transient ischemic attack) 05/29/2010  . CHEST PAIN 11/13/2008  . VENTRICULAR TACHYCARDIA 08/14/2008  . Automatic implantable cardioverter-defibrillator in situ 08/14/2008  . Type 2 diabetes with nephropathy ([place]) 08/11/2008  . DYSLIPIDEMIA 08/11/2008  . Cardiomyopathy, ischemic 08/11/2008  . Disorder resulting from impaired renal function 08/11/2008  .  HYPERCHOLESTEROLEMIA 04/29/2006  . HYPERTENSION, BENIGN SYSTEMIC 04/29/2006  . MYOCARDIAL INFARCTION, OLD 04/29/2006  . Coronary atherosclerosis 04/29/2006  . NEPHROLITHIASIS 04/29/2006    Current Outpatient Medications:  .  apixaban (ELIQUIS) 5 MG TABS tablet, Take 1 tablet (5 mg total) by mouth 2 (two) times daily., Disp: 60 tablet, Rfl: 6 .  atorvastatin (LIPITOR) 80 MG tablet, TAKE ONE TABLET BY MOUTH ONCE DAILY (Patient taking differently: TAKE 80 MG BY MOUTH ONCE DAILY), Disp: 90 tablet, Rfl: 3 .  digoxin (LANOXIN) 0.125 MG tablet, Take 1 tablet (0.125 mg total) by mouth daily., Disp: 90 tablet, Rfl: 3 .  Dulaglutide (TRULICITY) 3.66 QH/4.7ML SOPN, Inject 0.75 mg into the skin once a week., Disp: 4 pen, Rfl: 2 .  DULoxetine (CYMBALTA) 30 MG capsule, Take 30 mg by mouth daily., Disp: , Rfl:  .  insulin NPH-regular Human (NOVOLIN 70/30 RELION) (70-30) 100 UNIT/ML injection, Inject 30 Units into the skin 2 (two) times daily with a meal., Disp: 10 mL, Rfl: 12 .  losartan (COZAAR) 25 MG tablet, Take 12.5 mg by mouth at bedtime., Disp: , Rfl:  .  magnesium oxide (MAG-OX) 400 MG tablet, Take 1 tablet (400 mg total) by mouth daily., Disp: 90 tablet, Rfl: 3 .  metFORMIN (GLUCOPHAGE-XR) 750 MG 24 hr tablet, TAKE 2 TABLETS BY MOUTH ONCE DAILY WITH BREAKFAST, Disp: 180 tablet, Rfl: 0 .  Multiple Vitamins-Minerals (MULTIVITAMIN PO), Take 1 capsule by mouth daily. , Disp: , Rfl:  .  OVER THE COUNTER MEDICATION, Apply 1 application topically daily. Epsom Salt Foot Gel, Disp: , Rfl:  .  sotalol (BETAPACE) 120 MG tablet, TAKE ONE TABLET BY MOUTH TWICE DAILY (Patient taking differently: TAKE 120MG  BY  MOUTH TWICE DAILY), Disp: 180 tablet, Rfl: 3 .  torsemide (DEMADEX) 100 MG tablet, Take 50 mg Once Daily only on Monday, Tuesday, Thursday, Friday, and Saturdays., Disp: 30 tablet, Rfl: 6 Allergies  Allergen Reactions  . Penicillins Hives, Itching and Other (See Comments)    Has patient had a PCN reaction  causing immediate rash, facial/tongue/throat swelling, SOB or lightheadedness with hypotension: Yes Has patient had a PCN reaction causing severe rash involving mucus membranes or skin necrosis: No Has patient had a PCN reaction that required hospitalization: No Has patient had a PCN reaction occurring within the last 10 years: No If all of the above answers are "NO", then may proceed with Cephalosporin use.       Social History   Socioeconomic History  . Marital status: Married    Spouse name: Not on file  . Number of children: 4  . Years of education: 42  . Highest education level: Not on file  Social Needs  . Financial resource strain: Not on file  . Food insecurity - worry: Not on file  . Food insecurity - inability: Not on file  . Transportation needs - medical: Not on file  . Transportation needs - non-medical: Not on file  Occupational History  . Occupation: Psychiatrist - currently unemployed  Tobacco Use  . Smoking status: Former Smoker    Packs/day: 1.00    Years: 29.00    Pack years: 29.00    Types: Cigarettes    Last attempt to quit: 03/02/2000    Years since quitting: 17.1  . Smokeless tobacco: Never Used  Substance and Sexual Activity  . Alcohol use: No    Alcohol/week: 0.0 oz  . Drug use: No  . Sexual activity: Not on file  Other Topics Concern  . Not on file  Social History Narrative   Pt lives with wife in a one story home - married for 48 years. He hunts regularly and is active, walking > 1 mile at times without difficulty.  Has 4 children.     Retired Horticulturist, commercial.  Education: some college.    Physical Exam  Constitutional: He is oriented to person, place, and time.  Cardiovascular: Regular rhythm.  Pulmonary/Chest: Effort normal and breath sounds normal. No respiratory distress. He has no wheezes. He has no rales.  Abdominal: Soft.  Musculoskeletal: Normal range of motion. He exhibits no edema.  Neurological: He is alert and oriented to person,  place, and time.  Skin: Skin is warm and dry.  Psychiatric: He has a normal mood and affect.        Future Appointments  Date Time Provider [place]  05/10/2017 11:10 AM [facility] DEVICE REMOTES [facility]  05/19/2017  9:30 AM [facility] PA/NP [facility] None  06/03/2017  8:45 AM Elayne Snare, MD [facility] None  08/06/2017  8:00 AM Posey Pronto, Donika K, DO [facility] None    BP 110/70 (BP Location: Left Arm, Patient Position: Sitting, Cuff Size: Normal)   Pulse (!) 58   Resp 16   Wt 218 lb 4.8 oz (99 kg)   SpO2 98%   BMI 30.45 kg/m   Weight yesterday- 222 lb Last visit weight- 220 lb CBG- 169 mg/dL  Mr Lorentz was seen at home and reported feeling well. He denied SOB, headache, dizziness or orthopnea. He reported being compliant with his medications and is still filling his own pillbox. He stated his blood sugars have been creeping up in the mornings but he stated he has an  appointment with an endocrinologist early in April. He stated he has been more active lately since being told he could put more weight on his ankle. No changes seem necessary at this time.   Time spent with patient: 28 minutes   Jacquiline Doe, EMT 05/07/17  ACTION: Home visit completed Next visit planned for 1 week

## 2017-05-10 ENCOUNTER — Telehealth: Payer: Self-pay | Admitting: Cardiology

## 2017-05-10 ENCOUNTER — Encounter: Payer: Self-pay | Admitting: Licensed Clinical Social Worker

## 2017-05-10 NOTE — Telephone Encounter (Signed)
LMOVM reminding pt to send remote transmission.   

## 2017-05-11 NOTE — Progress Notes (Signed)
LVAD Initial Psychosocial Screening  Date/Time Initiated:  05/10/17 11:20am Referral Source:  [place] Referral Reason:  LVAD Implantation Source of Information:  Pt., wife and chart review  Demographics Name:  [name] [name] Address:  [address] [place] [address] Home phone:  [phone]   Cell: [phone] Marital Status:  Married  Faith:  Holiness Primary Language:  English SS: [id number]    DOB:  [birth year]  Medical & Follow-up Adherence to Medical regimen/INR checks: compliant  Medication adherence: compliant  Physician/Clinic Appointment Attendance: compliant    Advance Directives: Do you have a Living Will or Medical POA? No  Would you like to complete a Living Will and Medical POA prior to surgery?  No Do you have Goals of Care? Yes Patient reports he has shared his goals of care with wife and daughters and feels no need for AD.  Have you had a consult with the Palliative Care Team at [facility]? No  Psychological Health Appearance:  Unremarkable Mental Status:  Alert, oriented Eye Contact:  Good Thought Content:  Coherent Speech:  Unremarkable Mood:  Appropriate, Good Spirit and Positive  Affect:  Appropriate to circumstance and Positive Insight:  Good Judgement: Unimpaired Interaction Style:  Engaged and Positive  Family/Social Information Who lives in your home? Name:   Relationship:   Sabas Frett  Wife Terrance Coralyn Mark) Son  Other family members/support persons in your life? Name:   Relationship:   Robby Jacki Cones [facility]) Daughter Anson Crofts' Rosana Hoes   Daughter Rojelio Brenner Waiters   Niece  Caregiving Needs Who is the primary caregiver? [place] status:  good Do you drive?  yes Do you work?  Part time Physical Limitations:  none Do you have other care giving responsibilities?  none Contact number: [phone]  Who is the secondary caregiver? Shonna Chock, Anson Crofts' and [place] status:   good Do you drive?  yes Do you work?   yes Physical Limitations:  none Do you have other care giving responsibilities?  none   Home Environment/Personal Care Do you have reliable phone service? Yes  If so, what is the number?  [phone] Do you own or rent your home? own  Number of steps into the home? 3 steps How many levels in the home? 1 level Assistive devices in the home? Walker, w/c Electrical needs for LVAD (3 prong outlets)? yes Second hand smoke exposure in the home? Son smokes outside Travel distance from [place]? 20 minutes Self-care: independent Ambulation: independent  Community Are you active with community agencies/resources/homecare? No Agency Name: none Are you active in a church, synagogue, mosque or other faith based community? Yes Faith based institutions name: [employer] What other sources do you have for spiritual support? none Are you active in any clubs or social organizations? none What do you do for fun?  Hobbies?  Interests?  Hunt and sports  Education/Work Information What is the last grade of school you completed? Associates Degree Preferred method of learning?  Written, Verbal and Hands on Do you have any problems with reading or writing?  No Are you currently employed?  No  When were you last employed? 4 years  Name of employer? Self employed - [employer]  Please describe the kind of work you do? [employer]  How long have you worked there? 30 years If you are not working, do you plan to return to work after VAD surgery? Pending  If yes, what type of employment do you hope to find? Would like to return  to "overseeing" [employer] Are you interested in job training or learning new skills?  No Did you serve in the [place]? No  If so, what branch? Other  Financial Information What is your source of income? Social Security Do you have difficulty meeting your monthly expenses? No Can you budget for the monthly cost for dressing supplies post procedure? Yes  Primary  Health insurance:  [employer] Secondary Insurance: n/a Prescription plan: HTA What are your prescription co-pays? varies Do you use mail order for your prescriptions?  No Have you ever had to refuse medication due to cost?  No Have you applied for Medicaid?  no Have you applied for Social Security Disability (SSI)  no  Medical Information Briefly describe why you are here for evaluation: MI in 2002 and then a gradual decline Do you have a PCP or other medical provider? [name], NP Are you able to complete your ADL's? yes Do you have a history of trauma, physical, emotional, or sexual abuse? none Do you have any family history of heart problems? Grandma, mother and 4 siblings Do you smoke now or past usage? past usage    Quit date: age 67 Do you drink alcohol now or past usage? past usage social weekends   Are you currently using illegal drugs or misuse of medication or past usage? never  Have you ever been treated for substance abuse? No       Mental Health History How have you been feeling in the past year? Good but I realize that I am not the same person that I used to be. Have you ever had any problems with depression, anxiety or other mental health issues? no Do you see a counselor, psychiatrist or therapist?  no If you are currently experiencing problems are you interested in talking with a professional? No Have you or are you taking medications for anxiety/depression or any mental health concerns?  No but patient states that he takes Cymbalta for neuropathy   What are your coping strategies under stressful situations?"roll with it" Are there any other stressors in your life? no Have you had any past or current thoughts of suicide? no How many hours do you sleep at night? Varies because of the fluid pill How is your appetite? good Would you be interested in attending the LVAD support group? Yes  PHQ2 Depression Scale: 0  Legal Do you currently have  any legal issues/problems?  No Have you had any legal issues/problems in the past?  No Do you have a Durable POA?  No  Plan for VAD Implementation Do you know and understand what happens during the VAD surgery? Patient Verbalizes Understanding  of surgery and able to describe details What do you know about the risks and side effect associated with VAD surgery? Patient Verbalizes Understanding  of risks (infection, stroke and death) Explain what will happen right after surgery: Patient Verbalizes Understanding  of OR to ICU and will be intubated What is your plan for transportation for the first 8 weeks post-surgery? (Patients are not recommended to drive post-surgery for 8 weeks)  Driver: wife Do you have airbags in your vehicle?  There is a risk of discharging the device if the airbag were to deploy. What do you know about your diet post-surgery? Patient Verbalizes Understanding  of Heart healthy How do you plan to monitor your medications, current and future?   Pill box How do you plan to complete ADL's post-surgery?  Will ask for help Will it  be difficult to ask for help from your caregivers?  No wife will assist  Please explain what you hope will be improved about your life as a result of receiving the LVAD? "get back out there again" Please tell me your biggest concern or fear about living with the LVAD?  No fears How do you cope with your concerns and fears?  Faith in God Please explain your understanding of how their body will change?  I will figure a way to tote the batteries. Are you worried about these changes? no Do you see any barriers to your surgery or follow-up? None Understanding of LVAD Patient states understanding of the following: Surgical procedures and risks, Electrical need for LVAD (3 prong outlets), Safety precautions with LVAD (water, etc.), LVAD daily self-care (dressing changes, computer check, extra supplies), Outpatient follow up (LVAD clinic appts, monitoring  blood thinners) and Need for Emergency Planning  Discussed and Reviewed with Patient and Caregiver  Patient's current level of motivation to prepare for LVAD:  Motivated to feel better and get back to activity Patient's present Level of Consent for LVAD: ready- "let's go for it"     Education provided to patient/family/caregiver:   Caregiver role and responsibiltiy, Financial planning for LVAD, Role of Clinical Social Worker and Signs of Depression and Anxiety     Caregiver questions Please explain what you hope will be improved about your life and loved one's life as a result of receiving the LVAD?  His desire to get out. What is your biggest concern or fear about caregiving with an LVAD patient?  No fears What is your plan for availability to provide care 24/7 x2 weeks post op and dressing changes ongoing?  Wife/children Who is the relief/backup caregiver and what is their availability? Children and neice  Preferred method of learning? Written, Verbal and Hands on  Do you drive?  yes How do you handle stressful situations?  Pray and get out of the house Do you think you can do this? yes Is there anything that concerns about caregiving?   None Do you provide caregiving to anyone else? No    Caregiver's current level of motivation to prepare for LVAD: Motivated for improved health Caregiver's present level of consent for LVAD: Ready  Clinical Interventions Needed:    CSW will monitor signs and symptoms of depression and assist with adjustment to life with an LVAD. CSW encouraged attendance with the LVAD Support Group to assist further with adjustment and post implant peer support.  Clinical Impressions/Recommendations:   Mr. Winther is a 67yo male who is married and has 3 adult children. Patient reports he is compliant with his medical regimen and states he has goals of care although does not wish to complete an AD. He reports his wife will be primary caregiver and his 3 children along  with his niece will be the back up caregivers. Patient resides in his own home and denies any concerns with his home environment. He did mention that his son smokes outside and often the smoke carries in with him. He does not currently have an home care services. He is active with his church and reports he enjoys hunting and sports. 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 He denies any financial issues at this time. He admits to past usage  with tobacco use but quit at age 2. He also admits to past usage with alcohol but was a weekend social drinker and hasn't had a drink in years. He denies ever using any illegal substances. He denies any depression and anxiety but states he takes Cymbalta for neuropathy. He scored a 0 on the PHQ-2 and states he "rolls with it" to cope with stressors.  He hopes to "get back out there" after VAD implant. Patient appears to have a good support system and a positive outlook on life and VAD implantation. Patient appears to be a excellent candidate for LVAD implantation.  Melba Coon , [place]

## 2017-05-14 ENCOUNTER — Other Ambulatory Visit (HOSPITAL_COMMUNITY): Payer: Self-pay

## 2017-05-14 NOTE — Progress Notes (Signed)
Paramedicine Encounter    Patient ID: [name] [name], male    DOB: Aug 13, 1950, 67 y.o.   MRN: [id number]   Patient Care Team: [name], NP as PCP - General (Nurse Practitioner)  Patient Active Problem List   Diagnosis Date Noted  . Acute on chronic combined systolic and diastolic CHF (congestive heart failure) ([place]) 11/19/2016  . Acute on chronic left and right systolic w/ diastolic heart failure, NYHA class 2 ([place]) 11/18/2016  . Hx of atrial flutter 11/18/2016  . CAD (coronary artery disease)-RCA-T, 70% PL (off CFX), 99% Prox LAD/90% Dist LAD, S/P TAXUS stent x 2 11/18/2016  . HTN (hypertension) 11/18/2016  . Diabetes mellitus with complication ([place]) 03/04/7251  . Thrombocytopenia ([place]) 11/18/2016  . Bilateral lower extremity edema 11/12/2016  . Diabetic polyneuropathy associated with diabetes mellitus due to underlying condition ([place]) 05/29/2015  . Chronic systolic heart failure ([place]) 02/05/2014  . Medtronic biventricular ICD, serial number  BLD [id number]  02/05/2014  . CAD (coronary artery disease), native coronary artery 01/08/2014  . Chronic renal disease, stage III ([place]) 01/08/2014  . Heart failure ([place]) 12/16/2013  . Mitral regurgitation 04/17/2013  . Chronic systolic dysfunction of left ventricle 06/29/2012  . Atrial flutter ([place]) 06/03/2012  . Acute kidney injury (nontraumatic) ([place]) 05/31/2012  . Chronic anticoagulation   . Acute on chronic systolic heart failure ([place]) 05/21/2012  . Nausea with vomiting 05/18/2012  . PAF (paroxysmal atrial fibrillation) ([place]) 05/17/2012  . Orthopnea 05/17/2012  . TIA (transient ischemic attack) 05/29/2010  . CHEST PAIN 11/13/2008  . VENTRICULAR TACHYCARDIA 08/14/2008  . Automatic implantable cardioverter-defibrillator in situ 08/14/2008  . Type 2 diabetes with nephropathy ([place]) 08/11/2008  . DYSLIPIDEMIA 08/11/2008  . Cardiomyopathy, ischemic 08/11/2008  . Disorder resulting from impaired renal function 08/11/2008  .  HYPERCHOLESTEROLEMIA 04/29/2006  . HYPERTENSION, BENIGN SYSTEMIC 04/29/2006  . MYOCARDIAL INFARCTION, OLD 04/29/2006  . Coronary atherosclerosis 04/29/2006  . NEPHROLITHIASIS 04/29/2006    Current Outpatient Medications:  .  apixaban (ELIQUIS) 5 MG TABS tablet, Take 1 tablet (5 mg total) by mouth 2 (two) times daily., Disp: 60 tablet, Rfl: 6 .  atorvastatin (LIPITOR) 80 MG tablet, TAKE ONE TABLET BY MOUTH ONCE DAILY (Patient taking differently: TAKE 80 MG BY MOUTH ONCE DAILY), Disp: 90 tablet, Rfl: 3 .  digoxin (LANOXIN) 0.125 MG tablet, Take 1 tablet (0.125 mg total) by mouth daily., Disp: 90 tablet, Rfl: 3 .  Dulaglutide (TRULICITY) 6.64 QI/3.4VQ SOPN, Inject 0.75 mg into the skin once a week., Disp: 4 pen, Rfl: 2 .  DULoxetine (CYMBALTA) 30 MG capsule, Take 30 mg by mouth daily., Disp: , Rfl:  .  insulin NPH-regular Human (NOVOLIN 70/30 RELION) (70-30) 100 UNIT/ML injection, Inject 30 Units into the skin 2 (two) times daily with a meal., Disp: 10 mL, Rfl: 12 .  losartan (COZAAR) 25 MG tablet, Take 12.5 mg by mouth at bedtime., Disp: , Rfl:  .  magnesium oxide (MAG-OX) 400 MG tablet, Take 1 tablet (400 mg total) by mouth daily., Disp: 90 tablet, Rfl: 3 .  metFORMIN (GLUCOPHAGE-XR) 750 MG 24 hr tablet, TAKE 2 TABLETS BY MOUTH ONCE DAILY WITH BREAKFAST, Disp: 180 tablet, Rfl: 0 .  Multiple Vitamins-Minerals (MULTIVITAMIN PO), Take 1 capsule by mouth daily. , Disp: , Rfl:  .  OVER THE COUNTER MEDICATION, Apply 1 application topically daily. Epsom Salt Foot Gel, Disp: , Rfl:  .  sotalol (BETAPACE) 120 MG tablet, TAKE ONE TABLET BY MOUTH TWICE DAILY (Patient taking differently: TAKE 120MG  BY  MOUTH TWICE DAILY), Disp: 180 tablet, Rfl: 3 .  torsemide (DEMADEX) 100 MG tablet, Take 50 mg Once Daily only on Monday, Tuesday, Thursday, Friday, and Saturdays., Disp: 30 tablet, Rfl: 6 Allergies  Allergen Reactions  . Penicillins Hives, Itching and Other (See Comments)    Has patient had a PCN reaction  causing immediate rash, facial/tongue/throat swelling, SOB or lightheadedness with hypotension: Yes Has patient had a PCN reaction causing severe rash involving mucus membranes or skin necrosis: No Has patient had a PCN reaction that required hospitalization: No Has patient had a PCN reaction occurring within the last 10 years: No If all of the above answers are "NO", then may proceed with Cephalosporin use.       Social History   Socioeconomic History  . Marital status: Married    Spouse name: Not on file  . Number of children: 4  . Years of education: 15  . Highest education level: Not on file  Social Needs  . Financial resource strain: Not on file  . Food insecurity - worry: Not on file  . Food insecurity - inability: Not on file  . Transportation needs - medical: Not on file  . Transportation needs - non-medical: Not on file  Occupational History  . Occupation: Psychiatrist - currently unemployed  Tobacco Use  . Smoking status: Former Smoker    Packs/day: 1.00    Years: 29.00    Pack years: 29.00    Types: Cigarettes    Last attempt to quit: 03/02/2000    Years since quitting: 17.2  . Smokeless tobacco: Never Used  Substance and Sexual Activity  . Alcohol use: No    Alcohol/week: 0.0 oz  . Drug use: No  . Sexual activity: Not on file  Other Topics Concern  . Not on file  Social History Narrative   Pt lives with wife in a one story home - married for 20 years. He hunts regularly and is active, walking > 1 mile at times without difficulty.  Has 4 children.     Retired Horticulturist, commercial.  Education: some college.    Physical Exam  Constitutional: He is oriented to person, place, and time.  Cardiovascular: Normal rate and regular rhythm.  Pulmonary/Chest: Effort normal and breath sounds normal. No respiratory distress. He has no wheezes. He has no rales.  Abdominal: Soft. He exhibits no distension.  Musculoskeletal: Normal range of motion. He exhibits no edema.   Neurological: He is alert and oriented to person, place, and time.  Skin: Skin is warm and dry.  Psychiatric: He has a normal mood and affect.        Future Appointments  Date Time Provider [place]  05/24/2017  8:30 AM [facility] PA/NP [facility] None  05/28/2017  9:00 AM Esterwood, Amy S, PA-C [facility]  06/03/2017  8:45 AM Elayne Snare, MD [facility] None  08/06/2017  8:00 AM Narda Amber K, DO [facility] None    BP 106/60 (BP Location: Left Arm, Patient Position: Sitting, Cuff Size: Large)   Pulse (!) 56   Resp 16   Wt 216 lb 6.4 oz (98.2 kg)   SpO2 97%   BMI 30.18 kg/m   Weight yesterday- 217.3 lb Last visit weight- 218.2 lb CBG- 211 mg/dL  [name] [name] was seen at home today and reported feeling well. He denied SOB, headache, dizziness or orthopnea. He has been taking his medications as directed and said he is starting to be able to pout weight on his fractured  ankle, which was given the OK by his orthopedist. No changes were made and we planned our next meeting for next Friday at 11:00.  Time spent with patient: 31 minutes  Jacquiline Doe, EMT 05/14/17  ACTION: Home visit completed Next visit planned for 1 week

## 2017-05-14 NOTE — Progress Notes (Signed)
  No ICM remote transmission received for 05/10/2017 and next ICM transmission scheduled for 05/31/2017.

## 2017-05-19 ENCOUNTER — Encounter (HOSPITAL_COMMUNITY): Payer: PPO

## 2017-05-21 ENCOUNTER — Other Ambulatory Visit (HOSPITAL_COMMUNITY): Payer: Self-pay

## 2017-05-21 NOTE — Progress Notes (Signed)
Paramedicine Encounter    Patient ID: [name] [name], male    DOB: 07-May-1950, 67 y.o.   MRN: [id number]   Patient Care Team: [name], NP as PCP - General (Nurse Practitioner)  Patient Active Problem List   Diagnosis Date Noted  . Acute on chronic combined systolic and diastolic CHF (congestive heart failure) ([place]) 11/19/2016  . Acute on chronic left and right systolic w/ diastolic heart failure, NYHA class 2 ([place]) 11/18/2016  . Hx of atrial flutter 11/18/2016  . CAD (coronary artery disease)-RCA-T, 70% PL (off CFX), 99% Prox LAD/90% Dist LAD, S/P TAXUS stent x 2 11/18/2016  . HTN (hypertension) 11/18/2016  . Diabetes mellitus with complication ([place]) 54/62/7035  . Thrombocytopenia ([place]) 11/18/2016  . Bilateral lower extremity edema 11/12/2016  . Diabetic polyneuropathy associated with diabetes mellitus due to underlying condition ([place]) 05/29/2015  . Chronic systolic heart failure ([place]) 02/05/2014  . Medtronic biventricular ICD, serial number  BLD [id number] H  02/05/2014  . CAD (coronary artery disease), native coronary artery 01/08/2014  . Chronic renal disease, stage III ([place]) 01/08/2014  . Heart failure ([place]) 12/16/2013  . Mitral regurgitation 04/17/2013  . Chronic systolic dysfunction of left ventricle 06/29/2012  . Atrial flutter ([place]) 06/03/2012  . Acute kidney injury (nontraumatic) ([place]) 05/31/2012  . Chronic anticoagulation   . Acute on chronic systolic heart failure ([place]) 05/21/2012  . Nausea with vomiting 05/18/2012  . PAF (paroxysmal atrial fibrillation) ([place]) 05/17/2012  . Orthopnea 05/17/2012  . TIA (transient ischemic attack) 05/29/2010  . CHEST PAIN 11/13/2008  . VENTRICULAR TACHYCARDIA 08/14/2008  . Automatic implantable cardioverter-defibrillator in situ 08/14/2008  . Type 2 diabetes with nephropathy ([place]) 08/11/2008  . DYSLIPIDEMIA 08/11/2008  . Cardiomyopathy, ischemic 08/11/2008  . Disorder resulting from impaired renal function 08/11/2008  .  HYPERCHOLESTEROLEMIA 04/29/2006  . HYPERTENSION, BENIGN SYSTEMIC 04/29/2006  . MYOCARDIAL INFARCTION, OLD 04/29/2006  . Coronary atherosclerosis 04/29/2006  . NEPHROLITHIASIS 04/29/2006    Current Outpatient Medications:  .  apixaban (ELIQUIS) 5 MG TABS tablet, Take 1 tablet (5 mg total) by mouth 2 (two) times daily., Disp: 60 tablet, Rfl: 6 .  atorvastatin (LIPITOR) 80 MG tablet, TAKE ONE TABLET BY MOUTH ONCE DAILY (Patient taking differently: TAKE 80 MG BY MOUTH ONCE DAILY), Disp: 90 tablet, Rfl: 3 .  digoxin (LANOXIN) 0.125 MG tablet, Take 1 tablet (0.125 mg total) by mouth daily., Disp: 90 tablet, Rfl: 3 .  Dulaglutide (TRULICITY) 0.09 FG/1.8EX SOPN, Inject 0.75 mg into the skin once a week., Disp: 4 pen, Rfl: 2 .  DULoxetine (CYMBALTA) 30 MG capsule, Take 30 mg by mouth daily., Disp: , Rfl:  .  insulin NPH-regular Human (NOVOLIN 70/30 RELION) (70-30) 100 UNIT/ML injection, Inject 30 Units into the skin 2 (two) times daily with a meal., Disp: 10 mL, Rfl: 12 .  losartan (COZAAR) 25 MG tablet, Take 12.5 mg by mouth at bedtime., Disp: , Rfl:  .  magnesium oxide (MAG-OX) 400 MG tablet, Take 1 tablet (400 mg total) by mouth daily., Disp: 90 tablet, Rfl: 3 .  metFORMIN (GLUCOPHAGE-XR) 750 MG 24 hr tablet, TAKE 2 TABLETS BY MOUTH ONCE DAILY WITH BREAKFAST, Disp: 180 tablet, Rfl: 0 .  Multiple Vitamins-Minerals (MULTIVITAMIN PO), Take 1 capsule by mouth daily. , Disp: , Rfl:  .  OVER THE COUNTER MEDICATION, Apply 1 application topically daily. Epsom Salt Foot Gel, Disp: , Rfl:  .  sotalol (BETAPACE) 120 MG tablet, TAKE ONE TABLET BY MOUTH TWICE DAILY (Patient taking differently: TAKE 120MG  BY  MOUTH TWICE DAILY), Disp: 180 tablet, Rfl: 3 .  torsemide (DEMADEX) 100 MG tablet, Take 50 mg Once Daily only on Monday, Tuesday, Thursday, Friday, and Saturdays., Disp: 30 tablet, Rfl: 6 Allergies  Allergen Reactions  . Penicillins Hives, Itching and Other (See Comments)    Has patient had a PCN reaction  causing immediate rash, facial/tongue/throat swelling, SOB or lightheadedness with hypotension: Yes Has patient had a PCN reaction causing severe rash involving mucus membranes or skin necrosis: No Has patient had a PCN reaction that required hospitalization: No Has patient had a PCN reaction occurring within the last 10 years: No If all of the above answers are "NO", then may proceed with Cephalosporin use.       Social History   Socioeconomic History  . Marital status: Married    Spouse name: Not on file  . Number of children: 4  . Years of education: 58  . Highest education level: Not on file  Occupational History  . Occupation: Psychiatrist - currently unemployed  Social Needs  . Financial resource strain: Not on file  . Food insecurity:    Worry: Not on file    Inability: Not on file  . Transportation needs:    Medical: Not on file    Non-medical: Not on file  Tobacco Use  . Smoking status: Former Smoker    Packs/day: 1.00    Years: 29.00    Pack years: 29.00    Types: Cigarettes    Last attempt to quit: 03/02/2000    Years since quitting: 17.2  . Smokeless tobacco: Never Used  Substance and Sexual Activity  . Alcohol use: No    Alcohol/week: 0.0 oz  . Drug use: No  . Sexual activity: Not on file  Lifestyle  . Physical activity:    Days per week: Not on file    Minutes per session: Not on file  . Stress: Not on file  Relationships  . Social connections:    Talks on phone: Not on file    Gets together: Not on file    Attends religious service: Not on file    Active member of club or organization: Not on file    Attends meetings of clubs or organizations: Not on file    Relationship status: Not on file  . Intimate partner violence:    Fear of current or ex partner: Not on file    Emotionally abused: Not on file    Physically abused: Not on file    Forced sexual activity: Not on file  Other Topics Concern  . Not on file  Social History Narrative   Pt  lives with wife in a one story home - married for 2 years. He hunts regularly and is active, walking > 1 mile at times without difficulty.  Has 4 children.     Retired Horticulturist, commercial.  Education: some college.    Physical Exam  Constitutional: He is oriented to person, place, and time.  Cardiovascular: Regular rhythm.  Pulmonary/Chest: Effort normal and breath sounds normal. No respiratory distress. He has no wheezes. He has no rales.  Abdominal: Soft.  Musculoskeletal: Normal range of motion. He exhibits no edema.  Neurological: He is alert and oriented to person, place, and time.  Skin: Skin is warm and dry.  Psychiatric: He has a normal mood and affect.        Future Appointments  Date Time Provider [place]  05/24/2017  8:30 AM [facility] PA/NP [facility] None  05/28/2017  9:00 AM Esterwood, Amy S, PA-C [facility]  05/31/2017 12:45 PM [facility] DEVICE REMOTES [facility]  06/03/2017  8:45 AM Elayne Snare, MD [facility] None  08/06/2017  8:00 AM Posey Pronto, Donika K, DO [facility] None    BP (!) 84/52 (BP Location: Left Arm, Patient Position: Sitting, Cuff Size: Large)   Pulse (!) 55   Resp 16   Wt 216 lb 11.2 oz (98.3 kg)   SpO2 96%   BMI 30.22 kg/m   Weight yesterday- 218 lb Last visit weight- 216 lb  CBG- 187 mg/dL  Mr Joos was seen at home today and reported feeling well. He denied SOB, headache, dizziness or orthopnea. His BP was down today but he does not report feeling any different. I advised that if he maintains this low trend through the weekend to either call me or report this to the clinic at his appointment on Monday. He was agreeable. He continues to take his medications as prescribed, though his CBG has been staying high for the past could of weeks. He has an appointment with endocrinology at the beginning of April to address same.   Time spent with patient: 55 minutes  Jacquiline Doe, EMT 05/21/17  ACTION: Home visit completed Next  visit planned for 1 week

## 2017-05-24 ENCOUNTER — Ambulatory Visit (HOSPITAL_COMMUNITY)
Admission: RE | Admit: 2017-05-24 | Discharge: 2017-05-24 | Disposition: A | Discharge status: Home / Self Care | Payer: PPO | Source: Ambulatory Visit | Attending: Internal Medicine | Admitting: Internal Medicine

## 2017-05-24 ENCOUNTER — Encounter (HOSPITAL_COMMUNITY): Payer: Self-pay

## 2017-05-24 VITALS — BP 118/66 | Wt 228.0 lb

## 2017-05-24 DIAGNOSIS — I130 Hypertensive heart and chronic kidney disease with heart failure and stage 1 through stage 4 chronic kidney disease, or unspecified chronic kidney disease: Secondary | ICD-10-CM | POA: Diagnosis not present

## 2017-05-24 DIAGNOSIS — E7800 Pure hypercholesterolemia, unspecified: Secondary | ICD-10-CM | POA: Insufficient documentation

## 2017-05-24 DIAGNOSIS — Z87891 Personal history of nicotine dependence: Secondary | ICD-10-CM | POA: Diagnosis not present

## 2017-05-24 DIAGNOSIS — I480 Paroxysmal atrial fibrillation: Secondary | ICD-10-CM | POA: Diagnosis not present

## 2017-05-24 DIAGNOSIS — Z79899 Other long term (current) drug therapy: Secondary | ICD-10-CM | POA: Diagnosis not present

## 2017-05-24 DIAGNOSIS — Z794 Long term (current) use of insulin: Secondary | ICD-10-CM | POA: Diagnosis not present

## 2017-05-24 DIAGNOSIS — Z8249 Family history of ischemic heart disease and other diseases of the circulatory system: Secondary | ICD-10-CM | POA: Insufficient documentation

## 2017-05-24 DIAGNOSIS — I2510 Atherosclerotic heart disease of native coronary artery without angina pectoris: Secondary | ICD-10-CM | POA: Diagnosis not present

## 2017-05-24 DIAGNOSIS — I5022 Chronic systolic (congestive) heart failure: Secondary | ICD-10-CM | POA: Insufficient documentation

## 2017-05-24 DIAGNOSIS — N183 Chronic kidney disease, stage 3 (moderate): Secondary | ICD-10-CM | POA: Diagnosis not present

## 2017-05-24 DIAGNOSIS — I5082 Biventricular heart failure: Secondary | ICD-10-CM | POA: Insufficient documentation

## 2017-05-24 DIAGNOSIS — E1121 Type 2 diabetes mellitus with diabetic nephropathy: Secondary | ICD-10-CM | POA: Diagnosis not present

## 2017-05-24 DIAGNOSIS — E1122 Type 2 diabetes mellitus with diabetic chronic kidney disease: Secondary | ICD-10-CM | POA: Insufficient documentation

## 2017-05-24 DIAGNOSIS — Z87442 Personal history of urinary calculi: Secondary | ICD-10-CM | POA: Diagnosis not present

## 2017-05-24 DIAGNOSIS — I4892 Unspecified atrial flutter: Secondary | ICD-10-CM | POA: Insufficient documentation

## 2017-05-24 DIAGNOSIS — E11319 Type 2 diabetes mellitus with unspecified diabetic retinopathy without macular edema: Secondary | ICD-10-CM | POA: Insufficient documentation

## 2017-05-24 DIAGNOSIS — I255 Ischemic cardiomyopathy: Secondary | ICD-10-CM | POA: Insufficient documentation

## 2017-05-24 DIAGNOSIS — Z95810 Presence of automatic (implantable) cardiac defibrillator: Secondary | ICD-10-CM | POA: Diagnosis not present

## 2017-05-24 DIAGNOSIS — Z7901 Long term (current) use of anticoagulants: Secondary | ICD-10-CM | POA: Insufficient documentation

## 2017-05-24 DIAGNOSIS — I700 Atherosclerosis of aorta: Secondary | ICD-10-CM | POA: Diagnosis not present

## 2017-05-24 LAB — BASIC METABOLIC PANEL
ANION GAP: 8 (ref 5–15)
BUN: 20 mg/dL (ref 6–20)
CO2: 26 mmol/L (ref 22–32)
Calcium: 9.4 mg/dL (ref 8.9–10.3)
Chloride: 102 mmol/L (ref 101–111)
Creatinine, Ser: 0.98 mg/dL (ref 0.61–1.24)
GFR calc Af Amer: >60 mL/min (ref >60)
GFR calc non Af Amer: >60 mL/min (ref >60)
GLUCOSE: 166 mg/dL — AB (ref 65–99)
POTASSIUM: 3.8 mmol/L (ref 3.5–5.1)
SODIUM: 136 mmol/L (ref 135–145)

## 2017-05-24 LAB — HEMOGLOBIN A1C
HEMOGLOBIN A1C: 7.6 % — AB (ref 4.8–5.6)
Mean Plasma Glucose: 171.42 mg/dL

## 2017-05-24 NOTE — Progress Notes (Signed)
Advanced Heart Failure Clinic Note     Primary Cardiologist: Dr. [name] Primary HF: Dr. [name]   HPI: [name] [name] is a 67 y.o. 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   Admitted 11/18/16 with ADHF. Echo 11/19/16 with LVEF 15-20%, Mod MR, Mod LAE, Mild RV dilation and moderately reduced function, Moderate TR, PA peak pressure 44 mm Hg. Device interrogation showed recent bout of AF but was back in NSR. Suspect AF contributing to decompensation. Work up planned for possible transplant, RV function probably will not support LVAD. Discharge weight was 225 pounds on 11/27/16.   In 12/18 he had R/L heart cath with 3v CAD and low filling pressures/low output.   Today he returns for HF follow up with his wife. . Overall feeling fine. Denies SOB/PND/Orthopnea. No chest pain. Able to walk with a walker.  Still needs to wear boot on RLE. No bleeding problems.  Appetite ok. No fever or chills. Weight at home  216-218 pounds. Did not take meds this morning because he has not had breakfast. Taking all medications. Followed by Paramedicine.    Studies:  CT ABD/Chest  IMPRESSION: 1. Mild cardiomegaly with what appears to be mild interstitial pulmonary edema, suggesting a background of mild congestive heart failure. 2. Aortic atherosclerosis, in addition to three-vessel coronary artery disease. Please note that although the presence of coronary artery calcium documents the presence of coronary artery disease, the severity of this disease and any potential stenosis cannot be assessed on this non-gated CT examination. Assessment for potential risk factor modification, dietary therapy or pharmacologic therapy may be warranted, if clinically indicated. 3. There are mild calcifications of the aortic valve. Echocardiographic correlation for evaluation of potential  valvular dysfunction may be warranted if clinically indicated.  CPX 12/18: Resting HR: 57 Peak HR: 67  (44% age predicted max HR) BP rest: 90/64 BP peak: 78/58 Peak VO2: 9.3 (41% predicted peak VO2) VE/VCO2 slope: 53 OUES: 1.09 Peak RER: 0.90  R/L Cath 12/18: 1) LAD diffuse 60% stenosis 2) LCX multiple 40% lesions 3) RCA chronically occluded proximally with L to R collaterals  Ao = 99/56 (73) LV = 101/5 RA = 1 RV = 29/1 PA = 23/11 (16) PCW = 6 Fick cardiac output/index = 3.8/1.7 Thermo CO/CI = 4.1/1.9 PVR = 2.6 Ao sat = 99% PA sat = 60%, 61% PaPI = 12 RA/PCWP = 0.16  Review of systems complete and found to be negative unless listed in HPI.    Past Medical History:  Diagnosis Date  . AICD (automatic cardioverter/defibrillator) present 02/05/2014   Upgrade to Medtronic biventricular ICD, serial number  [id number]   . Atrial flutter ([place]) 04/2012   s/p TEE-EPS+RFCA 04/2012  . CAD (coronary artery disease) 5277,8242 X 2    RCA-T, 70% PL (off CFX), 99% Prox LAD/90% Dist LAD, S/P TAXUS stent x 2  . CHF (congestive heart failure) ([place])   . Chronic anticoagulation   . Chronic systolic heart failure ([place])   . CKD (chronic kidney disease)   . Diabetic retinopathy ([place])   . DM type 2 (diabetes mellitus, type 2) ([facility])    insulin dependent  . HTN (hypertension)   . Hypercholesteremia    ablation  . ICD (implantable cardiac defibrillator) in place   . Ischemic cardiomyopathy March 2015   20-25% 2D   .  Nephrolithiasis   . Ventricular tachycardia ([place])     Current Outpatient Medications  Medication Sig Dispense Refill  . apixaban (ELIQUIS) 5 MG TABS tablet Take 1 tablet (5 mg total) by mouth 2 (two) times daily. 60 tablet 6  . atorvastatin (LIPITOR) 80 MG tablet TAKE ONE TABLET BY MOUTH ONCE DAILY (Patient taking differently: TAKE 80 MG BY MOUTH ONCE DAILY) 90 tablet 3  . digoxin (LANOXIN) 0.125 MG tablet Take 1 tablet (0.125 mg total) by mouth daily. 90 tablet 3  .  Dulaglutide (TRULICITY) 2.99 ME/2.6ST SOPN Inject 0.75 mg into the skin once a week. 4 pen 2  . DULoxetine (CYMBALTA) 30 MG capsule Take 30 mg by mouth daily.    . insulin NPH-regular Human (NOVOLIN 70/30 RELION) (70-30) 100 UNIT/ML injection Inject 30 Units into the skin 2 (two) times daily with a meal. 10 mL 12  . losartan (COZAAR) 25 MG tablet Take 12.5 mg by mouth at bedtime.    . magnesium oxide (MAG-OX) 400 MG tablet Take 1 tablet (400 mg total) by mouth daily. 90 tablet 3  . metFORMIN (GLUCOPHAGE-XR) 750 MG 24 hr tablet TAKE 2 TABLETS BY MOUTH ONCE DAILY WITH BREAKFAST 180 tablet 0  . Multiple Vitamins-Minerals (MULTIVITAMIN PO) Take 1 capsule by mouth daily.     Marland Kitchen OVER THE COUNTER MEDICATION Apply 1 application topically daily. Epsom Salt Foot Gel    . sotalol (BETAPACE) 120 MG tablet TAKE ONE TABLET BY MOUTH TWICE DAILY (Patient taking differently: TAKE 120MG  BY MOUTH TWICE DAILY) 180 tablet 3  . torsemide (DEMADEX) 100 MG tablet Take 50 mg Once Daily only on Monday, Tuesday, Thursday, Friday, and Saturdays. 30 tablet 6   No current facility-administered medications for this encounter.     Allergies  Allergen Reactions  . Penicillins Hives, Itching and Other (See Comments)    Has patient had a PCN reaction causing immediate rash, facial/tongue/throat swelling, SOB or lightheadedness with hypotension: Yes Has patient had a PCN reaction causing severe rash involving mucus membranes or skin necrosis: No Has patient had a PCN reaction that required hospitalization: No Has patient had a PCN reaction occurring within the last 10 years: No If all of the above answers are "NO", then may proceed with Cephalosporin use.       Social History   Socioeconomic History  . Marital status: Married    Spouse name: Not on file  . Number of children: 4  . Years of education: 108  . Highest education level: Not on file  Occupational History  . Occupation: Psychiatrist - currently unemployed    Social Needs  . Financial resource strain: Not on file  . Food insecurity:    Worry: Not on file    Inability: Not on file  . Transportation needs:    Medical: Not on file    Non-medical: Not on file  Tobacco Use  . Smoking status: Former Smoker    Packs/day: 1.00    Years: 29.00    Pack years: 29.00    Types: Cigarettes    Last attempt to quit: 03/02/2000    Years since quitting: 17.2  . Smokeless tobacco: Never Used  Substance and Sexual Activity  . Alcohol use: No    Alcohol/week: 0.0 oz  . Drug use: No  . Sexual activity: Not on file  Lifestyle  . Physical activity:    Days per week: Not on file    Minutes per session: Not on file  . Stress: Not  on file  Relationships  . Social connections:    Talks on phone: Not on file    Gets together: Not on file    Attends religious service: Not on file    Active member of club or organization: Not on file    Attends meetings of clubs or organizations: Not on file    Relationship status: Not on file  . Intimate partner violence:    Fear of current or ex partner: Not on file    Emotionally abused: Not on file    Physically abused: Not on file    Forced sexual activity: Not on file  Other Topics Concern  . Not on file  Social History Narrative   Pt lives with wife in a one story home - married for 28 years. He hunts regularly and is active, walking > 1 mile at times without difficulty.  Has 4 children.     Retired Horticulturist, commercial.  Education: some college.   Family History  Problem Relation Age of Onset  . Heart failure Father        Deceased  . Alzheimer's disease Mother        Living  . Heart attack Mother   . CAD Brother   . CAD Brother   . CAD Brother   . CAD Brother   . Healthy Son   . Healthy Daughter   . Diabetes Brother   \   \  Vitals:   05/24/17 0842  BP: 118/66  SpO2: 98%  Weight: 228 lb (103.4 kg)   Wt Readings from Last 3 Encounters:  05/24/17 228 lb (103.4 kg)  05/21/17 216 lb 11.2 oz (98.3 kg)   05/14/17 216 lb 6.4 oz (98.2 kg)    PHYSICAL EXAM: General:  Well appearing. No resp difficulty. Walked in the clinic with a walker.  HEENT: normal Neck: supple.JVP ~10. Carotids 2+ bilat; no bruits. No lymphadenopathy or thryomegaly appreciated. Cor: PMI nondisplaced. Regular rate & rhythm. No rubs, gallops or murmurs. Lungs: clear Abdomen: soft, nontender, nondistended. No hepatosplenomegaly. No bruits or masses. Good bowel sounds. Extremities: no cyanosis, clubbing, rash, RLE boot. No edema.  Neuro: alert & orientedx3, cranial nerves grossly intact. moves all 4 extremities w/o difficulty. Affect pleasant   ASSESSMENT & PLAN: 1. Chronic systolic CHF: - Echo 0/86/57 EF 15-20% biventricular Failure with moderate RV dysfunction (likely combined ICM/NICM). He has Medtronic BiV ICD in place. Elevated fluid index noted on interrogation. No VT or AFib. Acitivity < 1 hour per day/  - Cath 12/18 with stable 1v CAD. Low filling pressures with CI 1.7  - CPX testing 12/18 with submax test but severe HF limitation with exertional hypotension. Peak VO2: 9.3 (41% predicted peak VO2). VE/VCO2 slope: 53 - Has GI /Endocrinology follow up in the next few weeks.  - Waiting on approval for referral to [facility] for transplant evaluation.  -NYHA II-III. Volume status mildly elevated.  - He will continue current torsemide 5 days a week and he will take 50 mg on Wed of this week. We discussed limiting high sodium foods and limiting fluid intake to < 2 liter per day.  - Continue losartan 12.5. BP too low to titrate - off b-blocker due to low output and low BP and bradycardia - continue digoxin 0.125 - On sotalol for VT. Continue for now.  -Blood Type O+   2. [name]  - recent cath with stable CAD as above - No s/s ischemia. Marland Kitchen -  Off ASA with apixaban. Continue statin   3. [name] - Recent A1c was 11.7. Check A1C today.  -Continue trulicity.  -He has appointment with endocrinology.   4. CKD  III BMET today   6. [name] - s/p ICD - Continue sotalol.   7. PAF s/p ablation 04/2012 Regular pulse today.   - Continue Eliquis.   We discussed that he needs to call the HF clinic with he develops dyspnea or fatigue. Discussed optivol results. Once cleared by ortho he needs to increase activity. BMET is stable. Hgb A1C obtained.    Follow 4 weeks with Dr [name] and [place] coordinators. Continue Paramedicine.   Darrick Grinder, NP 05/24/17 05/24/17

## 2017-05-24 NOTE — Patient Instructions (Signed)
Take Torsemide this Wednesday.  Follow up 3-4 weeks in [facility] clinic with Dr. [name].  __________________________________________________________ [name] [name] Code: 1100  Take all medication as prescribed the day of your appointment. Bring all medications with you to your appointment.  Do the following things EVERYDAY: 1) Weigh yourself in the morning before breakfast. Write it down and keep it in a log. 2) Take your medicines as prescribed 3) Eat low salt foods-Limit salt (sodium) to 2000 mg per day.  4) Stay as active as you can everyday 5) Limit all fluids for the day to less than 2 liters

## 2017-05-27 ENCOUNTER — Encounter: Payer: Self-pay | Admitting: Neurology

## 2017-05-27 ENCOUNTER — Ambulatory Visit (INDEPENDENT_AMBULATORY_CARE_PROVIDER_SITE_OTHER): Payer: PPO | Admitting: Neurology

## 2017-05-27 VITALS — BP 110/70 | HR 60 | Ht 71.0 in | Wt 220.0 lb

## 2017-05-27 DIAGNOSIS — M792 Neuralgia and neuritis, unspecified: Secondary | ICD-10-CM

## 2017-05-27 DIAGNOSIS — E0842 Diabetes mellitus due to underlying condition with diabetic polyneuropathy: Secondary | ICD-10-CM | POA: Diagnosis not present

## 2017-05-27 MED ORDER — GABAPENTIN 300 MG PO CAPS: 300.0000 mg | ORAL_CAPSULE | Freq: Three times a day (TID) | ORAL | 3 refills | Status: DC

## 2017-05-27 NOTE — Progress Notes (Signed)
Follow-up Visit   Date: 05/27/17    ALGERNON MUNDIE MRN: [id number] DOB: 1950-07-03   Interim History: [name] [name] is a 67 y.o. right-handed African American male with insulin-dependent diabetes mellitus, atrial flutter, CAD, CHF s/p ICD, hypertension, hyperlipidemia, and CKD returning to the clinic for follow-up of diabetic neuropathy.  The patient was accompanied to the clinic by wife.  History of present illness: Starting around October 2015 he was admitted with CHF exacerbation and developed severe swelling of the legs. Around the same time, he began noticing tingling pain involving the feet and lower calf which has gradually been worsening over the past year. It is worse after he has been walking all day and seem less painful in the morning. He cannot stand anyone touching his feet, because it irritates it.There is no numbness or weakness.  He endorses difficulty with balance, but has not fallen and walks independently. He has been diabetes for 15+ years and insulin-dependent for most of this time.   He was started on gabapentin and has some mild transient improvement, but despite increasing the dose to 600mg  TID, there was no benefit.   UPDATE 01/29/2017:  He is here for follow-up visit.  He tried Lyrica 75mg  twice daily, but did not appreciate any benefit so switched back to gabapentin 600mg  twice daily, which may help very mildly.  He would like to try higher dose of Lyrica.  Because of his cardiac comorbidities and prolonged QTc, avoid TCAs.  UPDATE 05/27/2016:  He is here for follow-up visit.  He suffered two falls in the early morning ~ 3am and does not know that he was awake, but only after falling realized that he was walking.  He is not sure if he was sleep walking and thinks he may have been going into the bathroom.  With his first fall, he suffered right lateral malleolus fracture and has been wearing a boot since 1/25.  He is taking Cymbalta 30mg  daily for his  neuropathic pain, but does not feel it helps. He endorses vivid dreams and thinks the medication maybe making him lightheaded.  He is being evaluated for LVAD.  Medications:  Current Outpatient Medications on File Prior to Visit  Medication Sig Dispense Refill  . apixaban (ELIQUIS) 5 MG TABS tablet Take 1 tablet (5 mg total) by mouth 2 (two) times daily. 60 tablet 6  . atorvastatin (LIPITOR) 80 MG tablet TAKE ONE TABLET BY MOUTH ONCE DAILY (Patient taking differently: TAKE 80 MG BY MOUTH ONCE DAILY) 90 tablet 3  . digoxin (LANOXIN) 0.125 MG tablet Take 1 tablet (0.125 mg total) by mouth daily. 90 tablet 3  . Dulaglutide (TRULICITY) 3.15 VV/6.1YW SOPN Inject 0.75 mg into the skin once a week. 4 pen 2  . insulin NPH-regular Human (NOVOLIN 70/30 RELION) (70-30) 100 UNIT/ML injection Inject 30 Units into the skin 2 (two) times daily with a meal. 10 mL 12  . losartan (COZAAR) 25 MG tablet Take 12.5 mg by mouth at bedtime.    . magnesium oxide (MAG-OX) 400 MG tablet Take 1 tablet (400 mg total) by mouth daily. 90 tablet 3  . metFORMIN (GLUCOPHAGE-XR) 750 MG 24 hr tablet TAKE 2 TABLETS BY MOUTH ONCE DAILY WITH BREAKFAST 180 tablet 0  . Multiple Vitamins-Minerals (MULTIVITAMIN PO) Take 1 capsule by mouth daily.     Marland Kitchen OVER THE COUNTER MEDICATION Apply 1 application topically daily. Epsom Salt Foot Gel    . sotalol (BETAPACE) 120 MG tablet TAKE ONE TABLET  BY MOUTH TWICE DAILY (Patient taking differently: TAKE 120MG  BY MOUTH TWICE DAILY) 180 tablet 3  . torsemide (DEMADEX) 100 MG tablet Take 50 mg Once Daily only on Monday, Tuesday, Thursday, Friday, and Saturdays. 30 tablet 6   No current facility-administered medications on file prior to visit.     Allergies:  Allergies  Allergen Reactions  . Penicillins Hives, Itching and Other (See Comments)    Has patient had a PCN reaction causing immediate rash, facial/tongue/throat swelling, SOB or lightheadedness with hypotension: Yes Has patient had a PCN  reaction causing severe rash involving mucus membranes or skin necrosis: No Has patient had a PCN reaction that required hospitalization: No Has patient had a PCN reaction occurring within the last 10 years: No If all of the above answers are "NO", then may proceed with Cephalosporin use.     Review of Systems:  CONSTITUTIONAL: No fevers, chills, night sweats, or weight loss.  EYES: No visual changes or eye pain ENT: No hearing changes.  No history of nose bleeds.   RESPIRATORY: No cough, wheezing and shortness of breath.   CARDIOVASCULAR: Negative for chest pain, and palpitations.   GI: Negative for abdominal discomfort, blood in stools or black stools.  No recent change in bowel habits.   GU:  No history of incontinence.   MUSCLOSKELETAL: No history of joint pain or swelling.  No myalgias.   SKIN: Negative for lesions, rash, and itching.   ENDOCRINE: Negative for cold or heat intolerance, polydipsia or goiter.   PSYCH:  No depression or anxiety symptoms.   NEURO: As Above.   Vital Signs:  BP 110/70   Pulse 60   Ht 5\' 11"  (1.803 m)   Wt 220 lb (99.8 kg)   SpO2 97%   BMI 30.68 kg/m   General:  Well appearing, comfortable  Neurological Exam: MENTAL STATUS including orientation to time, place, person, recent and remote memory, attention span and concentration, language, and fund of knowledge is normal.  Speech is not dysarthric.  CRANIAL NERVES:  Face is symmetric.  MOTOR: Motor strength is 5/5 proximally.  Right leg is supported in a boot.   SENSORY: Vibration is reduced at the left ankle, unable to test the right ankle.  Vibration intact at the knees.   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  Limited options for medications.   - Previously tried:  Lyrica (ineffective)  - Stop Cymbalta due to side effects and inadequate relief  - Start gabapentin 300mg  at bedtime and titrate to 300mg  TID.  Follow renal function for further titration  - Avoid TCA due to prolonged QTc  - Consider carbamazepine or topiramate going forward  ?REM behavior disorder vs medication effect (?cymbalta) causing his sleep walking.  He may need to be evaluated by sleep specialist, if this persists.    Return to clinic in 2 months  Greater than 50% of this 25 minute visit was spent in counseling, explanation of diagnosis, planning of further management, and coordination of care.   Thank  you for allowing me to participate in patient's care.  If I can answer any additional questions, I would be pleased to do so.    Sincerely,    Reta Norgren K. Posey Pronto, DO

## 2017-05-27 NOTE — Patient Instructions (Addendum)
Stop Cymbalta.  Start Gabapentin 300 mg tablets    Morning       Afternoon        Evening  Week 1                                  1 tab              Week 2 1 tab                   1 tab              Continue  1 tab          1 tab            1 tab         I will see you back in June.

## 2017-05-28 ENCOUNTER — Other Ambulatory Visit (HOSPITAL_COMMUNITY): Payer: Self-pay

## 2017-05-28 ENCOUNTER — Encounter: Payer: Self-pay | Admitting: Physician Assistant

## 2017-05-28 ENCOUNTER — Ambulatory Visit: Payer: PPO | Admitting: Physician Assistant

## 2017-05-28 ENCOUNTER — Telehealth (HOSPITAL_COMMUNITY): Payer: Self-pay

## 2017-05-28 VITALS — BP 80/56 | HR 60 | Ht 70.0 in | Wt 220.0 lb

## 2017-05-28 DIAGNOSIS — Z1211 Encounter for screening for malignant neoplasm of colon: Secondary | ICD-10-CM

## 2017-05-28 NOTE — Telephone Encounter (Signed)
[name] [name] called me to advise that he would not be able to make our appointment because he had another appointment in [place]. He stated he was feeling well but his blood pressure was down for the past two days though he denied feeling unwell. He stated the clinic increased his torsemide at his Monday visit due to some edema. I will consult with the clinic to see if they want to make any changes moving forward.

## 2017-05-28 NOTE — Patient Instructions (Signed)
Your provider has ordered Cologuard testing as an option for colon cancer screening. This is performed by [facility] and may be out of network with your insurance. PRIOR to completing the test, it is YOUR responsibility to contact your insurance about covered benefits for this test. Your out of pocket expense could be anywhere from $0.00 to $649.00.   When you call to check coverage with your insurer, please provide the following information:   -The ONLY provider of Cologuard is [facility]  - CPT code for Cologuard is 81528.  -[facility] NPI # [id number]  -[facility] Tax ID # [id number]   We have already sent your demographic and insurance information to [facility] (phone number [phone]) and they should contact you within the next week regarding your test. If you have not heard from them within the next week, please call our office at [phone].    

## 2017-05-28 NOTE — Progress Notes (Signed)
Reviewed and agree with management plan. He is high risk for colonoscopy and anesthesia.  Agree with Cologuard for CRC screening.   Pricilla Riffle. Fuller Plan, MD [facility]

## 2017-05-28 NOTE — Progress Notes (Signed)
Note created in error.

## 2017-05-28 NOTE — Progress Notes (Signed)
Subjective:    Patient ID: [name] [name], male    DOB: Apr 30, 1950, 67 y.o.   MRN: [id number]  HPI [name] [name] is a very nice 67 year old African-American male, new to GI today referred by Dr. [name] for colon screening. Patient is being considered for LVAD placement and possible heart transplant. Is not had any prior GI evaluation.  He did have CT of the abdomen and pelvis done February 2019 which showed aortic atherosclerosis but was otherwise negative. He has history of hypertension, coronary artery disease status post MI status post stent x2 and is maintained on Eliquis.  He also has a severe ischemic cardiomyopathy with EF of 15-20% as of December 2018.  He has atrial fibrillation, prior TIA, is status post ICD placement, also with adult onset diabetes mellitus and chronic kidney disease stage III. He has no current GI complaints, no problems with abdominal pain or discomfort, no heartburn indigestion or dysphasia.  No changes in bowel habits problems with diarrhea or constipation and no melena or hematochezia. Family history is negative for colon cancer and polyps.  Review of Systems Pertinent positive and negative review of systems were noted in the above HPI section.  All other review of systems was otherwise negative.  Outpatient Encounter Medications as of 05/28/2017  Medication Sig  . apixaban (ELIQUIS) 5 MG TABS tablet Take 1 tablet (5 mg total) by mouth 2 (two) times daily.  Marland Kitchen atorvastatin (LIPITOR) 80 MG tablet TAKE ONE TABLET BY MOUTH ONCE DAILY (Patient taking differently: TAKE 80 MG BY MOUTH ONCE DAILY)  . digoxin (LANOXIN) 0.125 MG tablet Take 1 tablet (0.125 mg total) by mouth daily.  . Dulaglutide (TRULICITY) 9.32 TF/5.7DU SOPN Inject 0.75 mg into the skin once a week.  . gabapentin (NEURONTIN) 300 MG capsule Take 1 capsule (300 mg total) by mouth 3 (three) times daily.  . insulin NPH-regular Human (NOVOLIN 70/30 RELION) (70-30) 100 UNIT/ML injection Inject 30 Units into  the skin 2 (two) times daily with a meal.  . losartan (COZAAR) 25 MG tablet Take 12.5 mg by mouth at bedtime.  . magnesium oxide (MAG-OX) 400 MG tablet Take 1 tablet (400 mg total) by mouth daily.  . metFORMIN (GLUCOPHAGE-XR) 750 MG 24 hr tablet TAKE 2 TABLETS BY MOUTH ONCE DAILY WITH BREAKFAST  . Multiple Vitamins-Minerals (MULTIVITAMIN PO) Take 1 capsule by mouth daily.   Marland Kitchen OVER THE COUNTER MEDICATION Apply 1 application topically daily. Epsom Salt Foot Gel  . sotalol (BETAPACE) 120 MG tablet TAKE ONE TABLET BY MOUTH TWICE DAILY (Patient taking differently: TAKE 120MG  BY MOUTH TWICE DAILY)  . torsemide (DEMADEX) 100 MG tablet Take 50 mg Once Daily only on Monday, Tuesday, Thursday, Friday, and Saturdays.   No facility-administered encounter medications on file as of 05/28/2017.    Allergies  Allergen Reactions  . Penicillins Hives, Itching and Other (See Comments)    Has patient had a PCN reaction causing immediate rash, facial/tongue/throat swelling, SOB or lightheadedness with hypotension: Yes Has patient had a PCN reaction causing severe rash involving mucus membranes or skin necrosis: No Has patient had a PCN reaction that required hospitalization: No Has patient had a PCN reaction occurring within the last 10 years: No If all of the above answers are "NO", then may proceed with Cephalosporin use.    Patient Active Problem List   Diagnosis Date Noted  . Acute on chronic combined systolic and diastolic CHF (congestive heart failure) ([place]) 11/19/2016  . Acute on chronic left and right systolic w/  diastolic heart failure, NYHA class 2 ([place]) 11/18/2016  . Hx of atrial flutter 11/18/2016  . CAD (coronary artery disease)-RCA-T, 70% PL (off CFX), 99% Prox LAD/90% Dist LAD, S/P TAXUS stent x 2 11/18/2016  . HTN (hypertension) 11/18/2016  . Diabetes mellitus with complication ([place]) 05/39/7673  . Thrombocytopenia ([place]) 11/18/2016  . Bilateral lower extremity edema 11/12/2016  . Diabetic  polyneuropathy associated with diabetes mellitus due to underlying condition ([place]) 05/29/2015  . Chronic systolic heart failure ([place]) 02/05/2014  . Medtronic biventricular ICD, serial number  [id number]  02/05/2014  . CAD (coronary artery disease), native coronary artery 01/08/2014  . Chronic renal disease, stage III ([place]) 01/08/2014  . Heart failure ([place]) 12/16/2013  . Mitral regurgitation 04/17/2013  . Chronic systolic dysfunction of left ventricle 06/29/2012  . Atrial flutter ([place]) 06/03/2012  . Acute kidney injury (nontraumatic) ([place]) 05/31/2012  . Chronic anticoagulation   . Acute on chronic systolic heart failure ([place]) 05/21/2012  . Nausea with vomiting 05/18/2012  . PAF (paroxysmal atrial fibrillation) ([place]) 05/17/2012  . Orthopnea 05/17/2012  . TIA (transient ischemic attack) 05/29/2010  . CHEST PAIN 11/13/2008  . VENTRICULAR TACHYCARDIA 08/14/2008  . Automatic implantable cardioverter-defibrillator in situ 08/14/2008  . Type 2 diabetes with nephropathy ([place]) 08/11/2008  . DYSLIPIDEMIA 08/11/2008  . Cardiomyopathy, ischemic 08/11/2008  . Disorder resulting from impaired renal function 08/11/2008  . HYPERCHOLESTEROLEMIA 04/29/2006  . HYPERTENSION, BENIGN SYSTEMIC 04/29/2006  . MYOCARDIAL INFARCTION, OLD 04/29/2006  . Coronary atherosclerosis 04/29/2006  . NEPHROLITHIASIS 04/29/2006   Social History   Socioeconomic History  . Marital status: Married    Spouse name: Not on file  . Number of children: 4  . Years of education: 60  . Highest education level: Not on file  Occupational History  . Occupation: Psychiatrist - currently unemployed  Social Needs  . Financial resource strain: Not on file  . Food insecurity:    Worry: Not on file    Inability: Not on file  . Transportation needs:    Medical: Not on file    Non-medical: Not on file  Tobacco Use  . Smoking status: Former Smoker    Packs/day: 1.00    Years: 29.00    Pack years: 29.00    Types: Cigarettes     Last attempt to quit: 03/02/2000    Years since quitting: 17.2  . Smokeless tobacco: Never Used  Substance and Sexual Activity  . Alcohol use: No    Alcohol/week: 0.0 oz  . Drug use: No  . Sexual activity: Not on file  Lifestyle  . Physical activity:    Days per week: Not on file    Minutes per session: Not on file  . Stress: Not on file  Relationships  . Social connections:    Talks on phone: Not on file    Gets together: Not on file    Attends religious service: Not on file    Active member of club or organization: Not on file    Attends meetings of clubs or organizations: Not on file    Relationship status: Not on file  . Intimate partner violence:    Fear of current or ex partner: Not on file    Emotionally abused: Not on file    Physically abused: Not on file    Forced sexual activity: Not on file  Other Topics Concern  . Not on file  Social History Narrative   Pt lives with wife in a one story home - married  for 44 years. He hunts regularly and is active, walking > 1 mile at times without difficulty.  Has 4 children.     Retired Horticulturist, commercial.  Education: some college.    [name] [name] family history includes Alzheimer's disease in his mother; CAD in his brother, brother, brother, and brother; Diabetes in his brother; Healthy in his daughter and son; Heart attack in his mother; Heart failure in his father; Other in his brother; Prostate cancer in his brother.      Objective:    Vitals:   05/28/17 0911  BP: (!) 80/56  Pulse: 60    Physical Exam; Well-developed older African-American male in no acute distress, accompanied by his wife, both pleasant blood pressure 80/56, pulse 60, height 5 foot 10, weight 220, BMI 31.5.  HEENT; nontraumatic normocephalic EOMI PERRLA sclera anicteric, Cardiovascular; regular rate and rhythm with S1-S2.  He has a defibrillator in the left chest wall Pulmonary; clear bilaterally, Abdomen; soft nontender nondistended bowel sounds are active  there is no palpable mass or hepatosplenomegaly, Rectal; exam not done, Extremities ;no clubbing cyanosis or edema skin warm and dry, Neuro psych; mood and affect appropriate       Assessment & Plan:   #60 67 year old African-American male referred for colon screening.  He is average risk for colon cancer and is asymptomatic from a GI standpoint. #2 severe ischemic cardiomyopathy with EF of 15-20%, he is status post ICD placement and is being considered for LVAD #3 coronary artery disease status post MI status post stent placement x2 #4.  Chronic anticoagulation-on Eliquis   #5 atrial fibrillation #6.  History of TIA #7.  Adult onset diabetes mellitus #8.  Chronic kidney disease stage III #9 hypotension-blood pressure 80/50 in the office today  Plan; Long discussion with the patient and his wife today.  Will proceed with Cologuard stool DNA testing.  Cologuard is positive, patient should have colonoscopy and this will need to be further discussed.  He will need to be scheduled at [facility], and possibly have anesthesia consultation prior to procedure.  We would also need to hold Eliquis for 48 hours prior to procedure.  Due to his severe ischemic cardiomyopathy and hypotension he is at significantly increased risk for complications with anesthesia.   Jazzelle Zhang S [name] Reinders PA-C 05/28/2017   Cc: [name], NP

## 2017-05-31 ENCOUNTER — Ambulatory Visit (INDEPENDENT_AMBULATORY_CARE_PROVIDER_SITE_OTHER): Payer: PPO

## 2017-05-31 ENCOUNTER — Telehealth: Payer: Self-pay

## 2017-05-31 DIAGNOSIS — Z95810 Presence of automatic (implantable) cardiac defibrillator: Secondary | ICD-10-CM | POA: Diagnosis not present

## 2017-05-31 DIAGNOSIS — I5022 Chronic systolic (congestive) heart failure: Secondary | ICD-10-CM

## 2017-05-31 NOTE — Progress Notes (Signed)
EPIC Encounter for ICM Monitoring  Patient Name: [name] [name] is a 67 y.o. male Date: 05/31/2017 Primary Care Physican: [name], NP Primary Cardiologist:[name] Electrophysiologist: [name] Dry Weight: Last office weight 228 lbs on 05/24/2017. (Baseline216-220 lbs) Bi-V Pacing:97.9%       Attempted call to patient and unable to reach.  Left detailed message regarding transmission.  Transmission reviewed.    Thoracic impedance normal for past 2 days but was abnormal suggesting fluid accumulation from 04/13/2017 through 05/17/2017 and 05/23/2017 through 05/27/2017.  Prescribed dosage: Torsemide 100 mgtake 50 mg Once Daily only on Monday, Tuesday, Thursday, Friday, and Saturdays.  Labs: 03/19/2017 Creatinine1.34, BUN30, Potassium5.2, Sodium137, EGFR53->60 02/17/2017 Creatinine1.44, BUN22, Potassium4.5, [id number], [id number]  02/05/2017 Creatinine1.37, BUN29, Potassium4.9, Sodium134, EGFR52->60  01/27/2017 Creatinine1.47, BUN44, Potassium4.7, Sodium132, [id number]  01/15/2017 Creatinine1.13, BUN25, Potassium4.7, Sodium131, EGFR>60 12/04/2016 Creatinine 1.32, BUN 33, Potassium 4.1, Sodium 136, EGFR 55->60 11/27/2016 Creatinine 1.36, BUN 45, Potassium 3.6, Sodium 133, EGFR 53->60 11/26/2016 Creatinine 1.38, BUN 39, Potassium 4.3, Sodium 133, EGFR 52-60  11/25/2016 Creatinine 1.26, BUN 38, Potassium 3.0, Sodium 135, EGFR 58->60  11/24/2016 Creatinine 1.33, BUN 39, Potassium 3.2, Sodium 133, EGFR 54->60  11/23/2016 Creatinine 1.38, BUN 36, Potassium 2.8, Sodium 138, EGFR 52-60  11/22/2016 Creatinine 1.48, BUN 36, Potassium 3.5, Sodium 134, EGFR 48-55  11/21/2016 Creatinine 1.37, BUN 33, Potassium 3.4, Sodium 136, EGFR 52->60  11/20/2016 Creatinine 1.44, BUN 31, Potassium 4.3, Sodium 140, EGFR 49-57   Recommendations: Left voice mail with ICM number and encouraged to call if experiencing any fluid symptoms.  Follow-up plan: ICM clinic  phone appointment on 07/01/2017.    Copy of ICM check sent to Dr. [name].   3 month ICM trend: 05/31/2017    1 Year ICM trend:       [name], RN 05/31/2017 2:30 PM

## 2017-05-31 NOTE — Telephone Encounter (Signed)
Remote ICM transmission received.  Attempted call to patient and left detailed message per DPR regarding transmission and next ICM scheduled for 07/01/2017.  Advised to return call for any fluid symptoms or questions.

## 2017-06-01 DIAGNOSIS — S8264XD Nondisplaced fracture of lateral malleolus of right fibula, subsequent encounter for closed fracture with routine healing: Secondary | ICD-10-CM | POA: Diagnosis not present

## 2017-06-02 NOTE — Progress Notes (Signed)
Patient ID: [name] [name], male   DOB: February 24, 1951, 67 y.o.   MRN: [id number]          Reason for Appointment: Consultation for Type 2 Diabetes   History of Present Illness:          Date of diagnosis of type 2 diabetes mellitus:  2005      Background history:   He believes he has been on insulin since the time of diagnosis Records of his previous treatment are not available His blood sugar control has been inconsistent and only a couple of times his A1c has been in the 7-8 range  Recent history:   INSULIN regimen is:   Novolin 70/30, taken at 7 AM and 7 PM     Non-insulin hypoglycemic drugs the patient is taking are: Trulicity 1.00 mg weekly and metformin ER 750 mg, 2 tablets daily  He has been referred here by his cardiologist for better control  Current management, blood sugar patterns and problems identified:  He takes his insulin at the same time daily without regard to his meals; this would be to 1-1/2 hours before breakfast and sometimes an hour after dinner  He only checks his blood sugar fasting in the morning and did not bring his monitor  His fasting readings are somewhat variable but relatively higher than last month  He had been given Trulicity last year when his A1c had gone up significantly and subsequently his blood sugars were better but he only took this for 3-4 months  He is back on Trulicity since January when his A1c went up to 11.7 and blood sugars were higher   Currently not able to do any exercise because of ankle fracture about 3 months ago  His diet appears to be usually low in fat and carbohydrate and he is not doing a lot of snacking or drinking sweet drinks        Side effects from medications have been: Occasional diarrhea from metformin  Compliance with the medical regimen: Fairly good Hypoglycemia:   Never  Glucose monitoring:  done 1 times a day         Glucometer: One Touch.      Blood Glucose readings by review of home  record    PREMEAL Breakfast Lunch Dinner Bedtime  Overall   Glucose range: 147-202      Median:        Self-care: The diet that the patient has been following is: tries to limit bread and sweets.      Typical meal intake: Breakfast is   usually boiled eggs and applesauce.  Lunch may be vegetables and meat and sometimes salad and hamburger meat.  Evening meal is usually meat and vegetables   Usually not having snacks            Dietician visit, most recent: None               Exercise:  Recently not able to because of right ankle fracture  Weight history:  Wt Readings from Last 3 Encounters:  06/03/17 230 lb (104.3 kg)  05/28/17 220 lb (99.8 kg)  05/27/17 220 lb (99.8 kg)    Glycemic control:   Lab Results  Component Value Date   HGBA1C 7.6 (H) 05/24/2017   HGBA1C 11.7 (H) 03/29/2017   HGBA1C 8.0 12/25/2016   Lab Results  Component Value Date   MICROALBUR 1.0 11/25/2015   LDLCALC 74 04/27/2016   CREATININE 0.98 05/24/2017   Lab Results  Component Value Date   MICRALBCREAT 2.3 11/25/2015    No results found for: FRUCTOSAMINE    Allergies as of 06/03/2017      Reactions   Penicillins Hives, Itching, Other (See Comments)   Has patient had a PCN reaction causing immediate rash, facial/tongue/throat swelling, SOB or lightheadedness with hypotension: Yes Has patient had a PCN reaction causing severe rash involving mucus membranes or skin necrosis: No Has patient had a PCN reaction that required hospitalization: No Has patient had a PCN reaction occurring within the last 10 years: No If all of the above answers are "NO", then may proceed with Cephalosporin use.      Medication List        Accurate as of 06/03/17 12:57 PM. Always use your most recent med list.          apixaban 5 MG Tabs tablet Commonly known as:  ELIQUIS Take 1 tablet (5 mg total) by mouth 2 (two) times daily.   atorvastatin 80 MG tablet Commonly known as:  LIPITOR TAKE ONE TABLET BY MOUTH  ONCE DAILY   digoxin 0.125 MG tablet Commonly known as:  LANOXIN Take 1 tablet (0.125 mg total) by mouth daily.   Dulaglutide 1.5 MG/0.5ML Sopn Commonly known as:  TRULICITY Inject contents of pen weekly   gabapentin 300 MG capsule Commonly known as:  NEURONTIN Take 1 capsule (300 mg total) by mouth 3 (three) times daily.   insulin NPH-regular Human (70-30) 100 UNIT/ML injection Commonly known as:  NOVOLIN 70/30 RELION Inject 30 Units into the skin 2 (two) times daily with a meal.   losartan 25 MG tablet Commonly known as:  COZAAR Take 12.5 mg by mouth at bedtime.   magnesium oxide 400 MG tablet Commonly known as:  MAG-OX Take 1 tablet (400 mg total) by mouth daily.   metFORMIN 750 MG 24 hr tablet Commonly known as:  GLUCOPHAGE-XR TAKE 2 TABLETS BY MOUTH ONCE DAILY WITH BREAKFAST   MULTIVITAMIN PO Take 1 capsule by mouth daily.   OVER THE COUNTER MEDICATION Apply 1 application topically daily. Epsom Salt Foot Gel   sotalol 120 MG tablet Commonly known as:  BETAPACE TAKE ONE TABLET BY MOUTH TWICE DAILY   torsemide 100 MG tablet Commonly known as:  DEMADEX Take 50 mg Once Daily only on Monday, Tuesday, Thursday, Friday, and Saturdays.       Allergies:  Allergies  Allergen Reactions  . Penicillins Hives, Itching and Other (See Comments)    Has patient had a PCN reaction causing immediate rash, facial/tongue/throat swelling, SOB or lightheadedness with hypotension: Yes Has patient had a PCN reaction causing severe rash involving mucus membranes or skin necrosis: No Has patient had a PCN reaction that required hospitalization: No Has patient had a PCN reaction occurring within the last 10 years: No If all of the above answers are "NO", then may proceed with Cephalosporin use.     Past Medical History:  Diagnosis Date  . AICD (automatic cardioverter/defibrillator) present 02/05/2014   Upgrade to Medtronic biventricular ICD, serial number  [id number]   .  Atrial flutter ([place]) 04/2012   s/p TEE-EPS+RFCA 04/2012  . CAD (coronary artery disease) 2952,8413 X 2    RCA-T, 70% PL (off CFX), 99% Prox LAD/90% Dist LAD, S/P TAXUS stent x 2  . CHF (congestive heart failure) ([place])   . Chronic anticoagulation   . Chronic systolic heart failure ([place])   . CKD (chronic kidney disease)   . Diabetic retinopathy ([place])   .  DM type 2 (diabetes mellitus, type 2) (HCC)    insulin dependent  . HTN (hypertension)   . Hypercholesteremia    ablation  . ICD (implantable cardiac defibrillator) in place   . Ischemic cardiomyopathy March 2015   20-25% 2D   . Nephrolithiasis   . Ventricular tachycardia [facility])     Past Surgical History:  Procedure Laterality Date  . ATRIAL FLUTTER ABLATION N/A 05/19/2012   Procedure: ATRIAL FLUTTER ABLATION;  Surgeon: Thompson Grayer, MD;  Location: [facility] CATH LAB;  Service: Cardiovascular;  Laterality: N/A;  . BI-VENTRICULAR IMPLANTABLE CARDIOVERTER DEFIBRILLATOR UPGRADE N/A 02/05/2014   Procedure: BI-VENTRICULAR IMPLANTABLE CARDIOVERTER DEFIBRILLATOR UPGRADE;  Surgeon: Evans Lance, MD;  Location: [facility] CATH LAB;  Service: Cardiovascular;  Laterality: N/A;  . BIV ICD GENERTAOR CHANGE OUT  02/05/2014   Upgrade to Medtronic biventricular ICD, serial number  [id number] by Dr. [name]  . CARDIAC DEFIBRILLATOR PLACEMENT  2007    Medtronic Maximo VR, serial number [id number]  . PERCUTANEOUS CORONARY STENT INTERVENTION (PCI-S)  January 2002   PTCA/Stent Distal RCA  . PERCUTANEOUS CORONARY STENT INTERVENTION (PCI-S)  June 2002   PTCA/Stent x 3 RCA, thrombolysis - failed  . PERCUTANEOUS CORONARY STENT INTERVENTION (PCI-S)  July 2006   TAXUS stents to prox and distal LAD  . RIGHT/LEFT HEART CATH AND CORONARY ANGIOGRAPHY N/A 02/18/2017   Procedure: RIGHT/LEFT HEART CATH AND CORONARY ANGIOGRAPHY;  Surgeon: Jolaine Artist, MD;  Location: [place] CV LAB;  Service: Cardiovascular;  Laterality: N/A;    Family History  Problem Relation Age  of Onset  . Heart failure Father        Deceased  . Alzheimer's disease Mother        Living  . Heart attack Mother   . CAD Brother   . Other Brother        blood cancer  . CAD Brother   . Prostate cancer Brother   . CAD Brother   . CAD Brother   . Healthy Son   . Healthy Daughter   . Diabetes Brother   . Stomach cancer Neg Hx   . Colon cancer Neg Hx     Social History:  reports that he quit smoking about 17 years ago. His smoking use included cigarettes. He has a 29.00 pack-year smoking history. He has never used smokeless tobacco. He reports that he does not drink alcohol or use drugs.   Review of Systems  Constitutional: Negative for weight gain and reduced appetite.  HENT: Negative for headaches.   Eyes: Negative for blurred vision.  Respiratory: Negative for shortness of breath.   Cardiovascular: Positive for leg swelling. Negative for chest pain.       Occasionally right leg may swell  Gastrointestinal: Negative for abdominal pain.  Endocrine: Negative for fatigue and polydipsia.  Genitourinary: Positive for nocturia.       Once  Musculoskeletal: Negative for joint pain.  Skin: Negative for rash.  Neurological: Positive for tingling. Negative for balance difficulty.       He has had sharp pains in his feet and lower legs treated with gabapentin by a neurologist  Psychiatric/Behavioral: Negative for insomnia.     Lipid history: He is on high intensity Lipitor, previous history of CAD    Lab Results  Component Value Date   CHOL 116 04/27/2016   HDL 31 (L) 04/27/2016   LDLCALC 74 04/27/2016   LDLDIRECT 197.0 07/10/2011   TRIG 57 04/27/2016   CHOLHDL 3.7 04/27/2016  Hypertension:Losartan  BP Readings from Last 3 Encounters:  06/03/17 110/68  05/28/17 (!) 80/56  05/27/17 110/70    Most recent eye exam was in 3/18, Dr Posey Pronto is his retinal surgeon and has had steroids injections and laser treatment before   Most recent foot exam:  4/19    LABS:  No visits with results within 1 Week(s) from this visit.  Latest known visit with results is:  Hospital Outpatient Visit on 05/24/2017  Component Date Value Ref Range Status  . Sodium 05/24/2017 136  135 - 145 mmol/L Final  . Potassium 05/24/2017 3.8  3.5 - 5.1 mmol/L Final  . Chloride 05/24/2017 102  101 - 111 mmol/L Final  . CO2 05/24/2017 26  22 - 32 mmol/L Final  . Glucose, Bld 05/24/2017 166* 65 - 99 mg/dL Final  . BUN 05/24/2017 20  6 - 20 mg/dL Final  . Creatinine, Ser 05/24/2017 0.98  0.61 - 1.24 mg/dL Final  . Calcium 05/24/2017 9.4  8.9 - 10.3 mg/dL Final  . GFR calc non Af Amer 05/24/2017 >60  >60 mL/min Final  . GFR calc Af Amer 05/24/2017 >60  >60 mL/min Final   Comment: (NOTE) The eGFR has been calculated using the CKD EPI equation. This calculation has not been validated in all clinical situations. eGFR's persistently <60 mL/min signify possible Chronic Kidney Disease.   Georgiann Hahn gap 05/24/2017 8  5 - 15 Final   Performed at [facility], [place] [address], [place], [place] [address]  . Hgb A1c MFr Bld 05/24/2017 7.6* 4.8 - 5.6 % Final   Comment: (NOTE) Pre diabetes:          5.7%-6.4% Diabetes:              >6.4% Glycemic control for   <7.0% adults with diabetes   . Mean Plasma Glucose 05/24/2017 171.42  mg/dL Final   Performed at [facility] [address], [place],  [address]    Physical Examination:  BP 110/68 (BP Location: Left Arm, Patient Position: Sitting, Cuff Size: Normal)   Pulse 60   Ht '5\' 10"'  (1.778 m)   Wt 230 lb (104.3 kg)   SpO2 98%   BMI 33.00 kg/m   GENERAL:         Patient has generalized obesity.    HEENT:         Eye exam shows normal external appearance.  Fundus exam shows no retinopathy.  Oral exam shows normal mucosa .  NECK:   There is no lymphadenopathy Thyroid is not enlarged and no nodules felt.  Carotids are normal to palpation and no bruit heard  LUNGS:         Chest is symmetrical.  Lungs are clear to auscultation.Marland Kitchen   HEART:         Heart sounds:  S1 and S2 are normal. No murmur or click heard., no S3 or S4.   ABDOMEN:   There is no distention present. Liver and spleen are not palpable. No other mass or tenderness present.    NEUROLOGICAL:   Ankle jerks are absent bilaterally.    Diabetic Foot Exam - Simple   Simple Foot Form Visual Inspection No deformities, no ulcerations, no other skin breakdown bilaterally:  Yes See comments:  Yes Sensation Testing See comments:  Yes Pulse Check See comments:  Yes Comments 1+ right ankle edema Nearly absent monofilament sensation distally on the right toes, present adequately on the left distal toes Absent  pedal pulses            Vibration sense is  reduced in distal first toes. MUSCULOSKELETAL:  There is no swelling or deformity of the peripheral joints.     EXTREMITIES:     There is no edema.  SKIN:       No rash or lesions of concern.        ASSESSMENT:  Diabetes type 2, uncontrolled     Last A1c 7.6  He has variably controlled diabetes with taking insulin and more recently Trulicity By history he appears to have benefited from adding Trulicity to his insulin regimen but is still taking only 0.75 mg Although he is not able to do much physical activity over the last 3 months he has kept his weight down His diet is relatively controlled with reduced carbohydrate and mostly low fat intake and some protein at each meals  Currently taking insulin somewhat arbitrarily without regard to the mealtimes and may be sometimes postprandial or even an hour before eating He may still have significant postprandial hyperglycemia Today discussed the type of insulin he is taking, need to control postprandial and fasting readings both as well as the appropriate timing for his premixed insulin Also explained that this may or may not be the ideal insulin for him and difficult to determine this unless he has more frequent glucose  monitoring after meals  Complications of diabetes: Peripheral neuropathy, unknown status of nephropathy, reportedly has had retinopathy also  CARDIOMYOPATHY: Appears to be adequately controlled at this time  Although he is a candidate for using Jardiance for both benefit with his CHF and diabetes this may lower his blood pressure further and would be difficult to balance out the Jardiance and his diuretic regimen  Hyperlipidemia: No recent labs available   PLAN:    He was explained the need to start taking his insulin consistently about 30 minutes before breakfast and suppertime  He will increase his Trulicity up to 1.5 mg weekly starting this Friday  Start alternating fasting and postprandial readings instead of just in the morning to check 2 hours after eating  Discussed blood sugar targets both fasting and after meals  He will try to cut back on higher fat foods  Also discussed potential for hypoglycemia with more effective treatment and given him brochure on hypoglycemic symptoms management  Will need to follow-up in about 3 weeks to reassess his blood sugar patterns and make adjustments as needed  Discussed that if his blood sugars are quite variable and has inconsistent postprandial readings may need to use 2 separate insulins instead of premixed insulin  He will start walking back again when he is able to  Patient Instructions  Check blood sugars on waking up  3-4/7  Also check blood sugars about 2 hours after a meal and do this after different meals by rotation  Recommended blood sugar levels on waking up is 90-130 and about 2 hours after meal is 130-160  Please bring your blood sugar monitor to each visit, thank you  Take insulin 30 min BEFORE AM AND PM meals    Counseling time on subjects discussed in assessment and plan sections is over 50% of today's 60 minute visit   Consultation note has been sent to the referring physician  Elayne Snare 06/03/2017, 12:57 PM    Note: This office note was prepared with Dragon voice recognition system technology. Any transcriptional errors that result from this process are unintentional.

## 2017-06-03 ENCOUNTER — Ambulatory Visit: Payer: PPO | Admitting: Endocrinology

## 2017-06-03 ENCOUNTER — Encounter: Payer: Self-pay | Admitting: Endocrinology

## 2017-06-03 VITALS — BP 110/68 | HR 60 | Ht 70.0 in | Wt 230.0 lb

## 2017-06-03 DIAGNOSIS — E1142 Type 2 diabetes mellitus with diabetic polyneuropathy: Secondary | ICD-10-CM | POA: Diagnosis not present

## 2017-06-03 DIAGNOSIS — E1165 Type 2 diabetes mellitus with hyperglycemia: Secondary | ICD-10-CM

## 2017-06-03 DIAGNOSIS — Z794 Long term (current) use of insulin: Secondary | ICD-10-CM

## 2017-06-03 MED ORDER — DULAGLUTIDE 1.5 MG/0.5ML ~~LOC~~ SOAJ: SUBCUTANEOUS | 2 refills | Status: DC

## 2017-06-03 NOTE — Patient Instructions (Addendum)
Check blood sugars on waking up  3-4/7  Also check blood sugars about 2 hours after a meal and do this after different meals by rotation  Recommended blood sugar levels on waking up is 90-130 and about 2 hours after meal is 130-160  Please bring your blood sugar monitor to each visit, thank you  Take insulin 30 min BEFORE AM AND PM meals

## 2017-06-04 ENCOUNTER — Other Ambulatory Visit (HOSPITAL_COMMUNITY): Payer: Self-pay

## 2017-06-04 ENCOUNTER — Encounter (HOSPITAL_COMMUNITY): Payer: Self-pay | Admitting: Cardiology

## 2017-06-04 ENCOUNTER — Telehealth (HOSPITAL_COMMUNITY): Payer: Self-pay

## 2017-06-04 NOTE — Progress Notes (Signed)
Paramedicine Encounter    Patient ID: [name] [name], male    DOB: Nov 04, 1950, 67 y.o.   MRN: [id number]   Patient Care Team: [name], NP as PCP - General (Nurse Practitioner)  Patient Active Problem List   Diagnosis Date Noted  . Acute on chronic combined systolic and diastolic CHF (congestive heart failure) ([place]) 11/19/2016  . Acute on chronic left and right systolic w/ diastolic heart failure, NYHA class 2 ([place]) 11/18/2016  . Hx of atrial flutter 11/18/2016  . CAD (coronary artery disease)-RCA-T, 70% PL (off CFX), 99% Prox LAD/90% Dist LAD, S/P TAXUS stent x 2 11/18/2016  . HTN (hypertension) 11/18/2016  . Diabetes mellitus with complication ([place]) 08/65/7846  . Thrombocytopenia ([place]) 11/18/2016  . Bilateral lower extremity edema 11/12/2016  . Diabetic polyneuropathy associated with diabetes mellitus due to underlying condition ([place]) 05/29/2015  . Chronic systolic heart failure ([place]) 02/05/2014  . Medtronic biventricular ICD, serial number  BLD [id number]  02/05/2014  . CAD (coronary artery disease), native coronary artery 01/08/2014  . Chronic renal disease, stage III ([place]) 01/08/2014  . Heart failure ([place]) 12/16/2013  . Mitral regurgitation 04/17/2013  . Chronic systolic dysfunction of left ventricle 06/29/2012  . Atrial flutter ([place]) 06/03/2012  . Acute kidney injury (nontraumatic) ([place]) 05/31/2012  . Chronic anticoagulation   . Acute on chronic systolic heart failure ([place]) 05/21/2012  . Nausea with vomiting 05/18/2012  . PAF (paroxysmal atrial fibrillation) ([place]) 05/17/2012  . Orthopnea 05/17/2012  . TIA (transient ischemic attack) 05/29/2010  . CHEST PAIN 11/13/2008  . VENTRICULAR TACHYCARDIA 08/14/2008  . Automatic implantable cardioverter-defibrillator in situ 08/14/2008  . Type 2 diabetes with nephropathy ([place]) 08/11/2008  . DYSLIPIDEMIA 08/11/2008  . Cardiomyopathy, ischemic 08/11/2008  . Disorder resulting from impaired renal function 08/11/2008  .  HYPERCHOLESTEROLEMIA 04/29/2006  . HYPERTENSION, BENIGN SYSTEMIC 04/29/2006  . MYOCARDIAL INFARCTION, OLD 04/29/2006  . Coronary atherosclerosis 04/29/2006  . NEPHROLITHIASIS 04/29/2006    Current Outpatient Medications:  .  apixaban (ELIQUIS) 5 MG TABS tablet, Take 1 tablet (5 mg total) by mouth 2 (two) times daily., Disp: 60 tablet, Rfl: 6 .  atorvastatin (LIPITOR) 80 MG tablet, TAKE ONE TABLET BY MOUTH ONCE DAILY (Patient taking differently: TAKE 80 MG BY MOUTH ONCE DAILY), Disp: 90 tablet, Rfl: 3 .  digoxin (LANOXIN) 0.125 MG tablet, Take 1 tablet (0.125 mg total) by mouth daily., Disp: 90 tablet, Rfl: 3 .  Dulaglutide (TRULICITY) 1.5 NG/2.9BM SOPN, Inject contents of pen weekly, Disp: 4 pen, Rfl: 2 .  gabapentin (NEURONTIN) 300 MG capsule, Take 1 capsule (300 mg total) by mouth 3 (three) times daily., Disp: 270 capsule, Rfl: 3 .  insulin NPH-regular Human (NOVOLIN 70/30 RELION) (70-30) 100 UNIT/ML injection, Inject 30 Units into the skin 2 (two) times daily with a meal., Disp: 10 mL, Rfl: 12 .  losartan (COZAAR) 25 MG tablet, Take 12.5 mg by mouth at bedtime., Disp: , Rfl:  .  magnesium oxide (MAG-OX) 400 MG tablet, Take 1 tablet (400 mg total) by mouth daily., Disp: 90 tablet, Rfl: 3 .  metFORMIN (GLUCOPHAGE-XR) 750 MG 24 hr tablet, TAKE 2 TABLETS BY MOUTH ONCE DAILY WITH BREAKFAST (Patient taking differently: Take 750 mg by mouth. TAKE 1 TABLETS BY MOUTH DAILY WITH BREAKFAST AND 1 TABLET AT DINNER.), Disp: 180 tablet, Rfl: 0 .  Multiple Vitamins-Minerals (MULTIVITAMIN PO), Take 1 capsule by mouth daily. , Disp: , Rfl:  .  OVER THE COUNTER MEDICATION, Apply 1 application topically daily. Epsom Salt Foot Gel,  Disp: , Rfl:  .  sotalol (BETAPACE) 120 MG tablet, TAKE ONE TABLET BY MOUTH TWICE DAILY (Patient taking differently: TAKE 120MG  BY MOUTH TWICE DAILY), Disp: 180 tablet, Rfl: 3 .  torsemide (DEMADEX) 100 MG tablet, Take 50 mg Once Daily only on Monday, Tuesday, Thursday, Friday, and  Saturdays., Disp: 30 tablet, Rfl: 6 Allergies  Allergen Reactions  . Penicillins Hives, Itching and Other (See Comments)    Has patient had a PCN reaction causing immediate rash, facial/tongue/throat swelling, SOB or lightheadedness with hypotension: Yes Has patient had a PCN reaction causing severe rash involving mucus membranes or skin necrosis: No Has patient had a PCN reaction that required hospitalization: No Has patient had a PCN reaction occurring within the last 10 years: No If all of the above answers are "NO", then may proceed with Cephalosporin use.       Social History   Socioeconomic History  . Marital status: Married    Spouse name: Not on file  . Number of children: 4  . Years of education: 67  . Highest education level: Not on file  Occupational History  . Occupation: Psychiatrist - currently unemployed  Social Needs  . Financial resource strain: Not on file  . Food insecurity:    Worry: Not on file    Inability: Not on file  . Transportation needs:    Medical: Not on file    Non-medical: Not on file  Tobacco Use  . Smoking status: Former Smoker    Packs/day: 1.00    Years: 29.00    Pack years: 29.00    Types: Cigarettes    Last attempt to quit: 03/02/2000    Years since quitting: 17.2  . Smokeless tobacco: Never Used  Substance and Sexual Activity  . Alcohol use: No    Alcohol/week: 0.0 oz  . Drug use: No  . Sexual activity: Not on file  Lifestyle  . Physical activity:    Days per week: Not on file    Minutes per session: Not on file  . Stress: Not on file  Relationships  . Social connections:    Talks on phone: Not on file    Gets together: Not on file    Attends religious service: Not on file    Active member of club or organization: Not on file    Attends meetings of clubs or organizations: Not on file    Relationship status: Not on file  . Intimate partner violence:    Fear of current or ex partner: Not on file    Emotionally abused: Not  on file    Physically abused: Not on file    Forced sexual activity: Not on file  Other Topics Concern  . Not on file  Social History Narrative   Pt lives with wife in a one story home - married for 71 years. He hunts regularly and is active, walking > 1 mile at times without difficulty.  Has 4 children.     Retired Horticulturist, commercial.  Education: some college.    Physical Exam  Constitutional: He is oriented to person, place, and time.  Neck: JVD present.  Cardiovascular: Regular rhythm.  Pulmonary/Chest: Effort normal and breath sounds normal. No respiratory distress. He has no wheezes. He has no rales.  Abdominal: Soft.  Musculoskeletal: Normal range of motion. He exhibits no edema.  Neurological: He is alert and oriented to person, place, and time.  Skin: Skin is warm and dry.  Psychiatric: He has a  normal mood and affect.        Future Appointments  Date Time Provider [place]  06/24/2017 10:00 AM [facility] None  07/01/2017  1:10 PM [facility] DEVICE REMOTES [facility]  07/06/2017  9:00 AM Elayne Snare, MD [facility] None  08/06/2017  8:00 AM Narda Amber K, DO [facility] None    BP 92/64 (BP Location: Left Arm, Patient Position: Sitting, Cuff Size: Large)   Pulse (!) 56   Resp 16   Wt 223 lb 11.2 oz (101.5 kg)   SpO2 96%   BMI 32.10 kg/m   Weight yesterday- 219.1 lb Last visit weight- 216 lb  [name] [name] was seen at home today and reported feeling well. He denied SOB, headache, dizziness or orthopnea. He reported being compliant with his medications and stated that he has been limiting his salt and fluid intake. He reported a weight gain of over 4 pounds since yesterday. I contacted the clinic to see if they wanted to adjust any medications and they advised that no change was necessary. He saw the endocrinologist yesterday and stated they increased his Trulicity to 1.5 mg once per week. His neurologist has stopped Cymbalta and started him on  300 mg of Gabapentin TID. No other issues or changes noted.   Time spent with patient: 30 minutes  Jacquiline Doe, EMT 06/04/17  ACTION: Home visit completed Next visit planned for 1 week

## 2017-06-04 NOTE — Telephone Encounter (Addendum)
Advanced Heart Failure Triage Encounter  Patient Name: [name] [name]  Date of Call: 06/04/17  Problem:  Zack paramed called b/c pt has gained 4.6 lbs overnight. Pt. denies SOB, CP, cough, and edema. Pt reports taking all meds as prescribed and BP today was 92/64.   Plan: [name] Grinder, NP made aware. No further orders at this time and Zack made aware.    Shirley Muscat, RN

## 2017-06-09 ENCOUNTER — Telehealth (HOSPITAL_COMMUNITY): Payer: Self-pay

## 2017-06-09 NOTE — Telephone Encounter (Signed)
I called [name] [name] to confirm our standing appointment on Friday at 11:00. He did not answer so I left a voicemail requesting he call me back to confirm.

## 2017-06-09 NOTE — Telephone Encounter (Signed)
Mr Bostwick returned my call and confirmed that Friday at 11:00 was still a god time for him and he would let me know if anything changes before then.

## 2017-06-11 ENCOUNTER — Telehealth (HOSPITAL_COMMUNITY): Payer: Self-pay

## 2017-06-11 ENCOUNTER — Encounter: Payer: Self-pay | Admitting: *Deleted

## 2017-06-11 ENCOUNTER — Other Ambulatory Visit: Payer: Self-pay | Admitting: *Deleted

## 2017-06-11 NOTE — Patient Outreach (Signed)
HTA High Risk patient screening. Pt has HF and is being seen by Dr. Missy Sabins and his team. EMS visits him weekly. Pt reports daily weights, following diet and medication regimen faithfully. He is being worked up for a possible heart transplant.  I will send him our [facility] information for future reference.  Eulah Pont. Myrtie Neither, MSN, [facility] Gerontological Nurse Practitioner [facility] Care Management [phone]

## 2017-06-11 NOTE — Telephone Encounter (Signed)
[name] [name] called me this morning to say he had to go to his grandson's school today and would not be able to meet today. He stated that his weights have been ranging from 218-223 lb this week. He said he had not gained more than three pounds over night. His SBP was 107 per his home cuff and his CBG was 144 mg/dL. I encouraged him to monitor his weight closely and let me know if he needed anything before we meet next week.

## 2017-06-14 ENCOUNTER — Telehealth: Payer: Self-pay | Admitting: Physician Assistant

## 2017-06-15 NOTE — Telephone Encounter (Signed)
Called Carole Binning, [employer] Coordinator and [employer] for her that I just faxed the Cologuard paperwork to [employer] today. I spoke to a representative and they will look for my fax and try to put a rush on calling the patient to verify his address and ship the kit to him. Called patient to advise.

## 2017-06-15 NOTE — Telephone Encounter (Signed)
I spoke to patient's wife [name].  I apologized to explain that I filled out the paperwork, copied the insurance cards when he was here on 3-29  but neglected to fax the paperwork to [employer].  I told her that I did fax it today and have someone from [employer] looking for my fax so they can put a rush on processing the paperwork and calling her husband or her to verifiy the address. They will then ship out the kit.  She did mention they had not gotten a phone call yet.  She was not upset and told me she and her husband will look out for the phone call. I again apologized.

## 2017-06-23 ENCOUNTER — Other Ambulatory Visit (HOSPITAL_COMMUNITY): Payer: Self-pay | Admitting: Unknown Physician Specialty

## 2017-06-23 DIAGNOSIS — I5022 Chronic systolic (congestive) heart failure: Secondary | ICD-10-CM

## 2017-06-24 ENCOUNTER — Other Ambulatory Visit (HOSPITAL_COMMUNITY): Payer: Self-pay

## 2017-06-24 ENCOUNTER — Encounter (HOSPITAL_COMMUNITY): Payer: Self-pay

## 2017-06-24 ENCOUNTER — Ambulatory Visit (HOSPITAL_COMMUNITY)
Admission: RE | Admit: 2017-06-24 | Discharge: 2017-06-24 | Disposition: A | Discharge status: Home / Self Care | Payer: PPO | Source: Ambulatory Visit | Attending: Internal Medicine | Admitting: Internal Medicine

## 2017-06-24 VITALS — BP 98/62 | HR 58 | Wt 223.0 lb

## 2017-06-24 DIAGNOSIS — I2510 Atherosclerotic heart disease of native coronary artery without angina pectoris: Secondary | ICD-10-CM | POA: Insufficient documentation

## 2017-06-24 DIAGNOSIS — N182 Chronic kidney disease, stage 2 (mild): Secondary | ICD-10-CM | POA: Diagnosis not present

## 2017-06-24 DIAGNOSIS — I130 Hypertensive heart and chronic kidney disease with heart failure and stage 1 through stage 4 chronic kidney disease, or unspecified chronic kidney disease: Secondary | ICD-10-CM | POA: Insufficient documentation

## 2017-06-24 DIAGNOSIS — Z7901 Long term (current) use of anticoagulants: Secondary | ICD-10-CM | POA: Diagnosis not present

## 2017-06-24 DIAGNOSIS — Z95810 Presence of automatic (implantable) cardiac defibrillator: Secondary | ICD-10-CM | POA: Diagnosis not present

## 2017-06-24 DIAGNOSIS — Z9889 Other specified postprocedural states: Secondary | ICD-10-CM | POA: Diagnosis not present

## 2017-06-24 DIAGNOSIS — Z8249 Family history of ischemic heart disease and other diseases of the circulatory system: Secondary | ICD-10-CM | POA: Diagnosis not present

## 2017-06-24 DIAGNOSIS — Z794 Long term (current) use of insulin: Secondary | ICD-10-CM | POA: Insufficient documentation

## 2017-06-24 DIAGNOSIS — I9589 Other hypotension: Secondary | ICD-10-CM | POA: Insufficient documentation

## 2017-06-24 DIAGNOSIS — I5082 Biventricular heart failure: Secondary | ICD-10-CM | POA: Insufficient documentation

## 2017-06-24 DIAGNOSIS — Z833 Family history of diabetes mellitus: Secondary | ICD-10-CM | POA: Insufficient documentation

## 2017-06-24 DIAGNOSIS — I5022 Chronic systolic (congestive) heart failure: Secondary | ICD-10-CM | POA: Insufficient documentation

## 2017-06-24 DIAGNOSIS — E1121 Type 2 diabetes mellitus with diabetic nephropathy: Secondary | ICD-10-CM | POA: Diagnosis not present

## 2017-06-24 DIAGNOSIS — E1122 Type 2 diabetes mellitus with diabetic chronic kidney disease: Secondary | ICD-10-CM | POA: Insufficient documentation

## 2017-06-24 DIAGNOSIS — Z880 Allergy status to penicillin: Secondary | ICD-10-CM | POA: Insufficient documentation

## 2017-06-24 DIAGNOSIS — R001 Bradycardia, unspecified: Secondary | ICD-10-CM | POA: Diagnosis not present

## 2017-06-24 DIAGNOSIS — I255 Ischemic cardiomyopathy: Secondary | ICD-10-CM | POA: Diagnosis not present

## 2017-06-24 DIAGNOSIS — E7800 Pure hypercholesterolemia, unspecified: Secondary | ICD-10-CM | POA: Diagnosis not present

## 2017-06-24 DIAGNOSIS — I480 Paroxysmal atrial fibrillation: Secondary | ICD-10-CM | POA: Diagnosis not present

## 2017-06-24 DIAGNOSIS — Z87891 Personal history of nicotine dependence: Secondary | ICD-10-CM | POA: Diagnosis not present

## 2017-06-24 DIAGNOSIS — I519 Heart disease, unspecified: Secondary | ICD-10-CM

## 2017-06-24 DIAGNOSIS — Z79899 Other long term (current) drug therapy: Secondary | ICD-10-CM | POA: Insufficient documentation

## 2017-06-24 LAB — BASIC METABOLIC PANEL
Anion gap: 10 (ref 5–15)
BUN: 26 mg/dL — AB (ref 6–20)
CO2: 24 mmol/L (ref 22–32)
CREATININE: 1.23 mg/dL (ref 0.61–1.24)
Calcium: 9.5 mg/dL (ref 8.9–10.3)
Chloride: 102 mmol/L (ref 101–111)
GFR calc Af Amer: >60 mL/min (ref >60)
GFR calc non Af Amer: 59 mL/min — ABNORMAL LOW (ref >60)
Glucose, Bld: 256 mg/dL — ABNORMAL HIGH (ref 65–99)
POTASSIUM: 4 mmol/L (ref 3.5–5.1)
Sodium: 136 mmol/L (ref 135–145)

## 2017-06-24 LAB — CBC
HCT: 37.9 % — ABNORMAL LOW (ref 39.0–52.0)
Hemoglobin: 12.3 g/dL — ABNORMAL LOW (ref 13.0–17.0)
MCH: 28.5 pg (ref 26.0–34.0)
MCHC: 32.5 g/dL (ref 30.0–36.0)
MCV: 87.7 fL (ref 78.0–100.0)
PLATELETS: 177 10*3/uL (ref 150–400)
RBC: 4.32 MIL/uL (ref 4.22–5.81)
RDW: 14.4 % (ref 11.5–15.5)
WBC: 5.4 10*3/uL (ref 4.0–10.5)

## 2017-06-24 NOTE — Progress Notes (Signed)
Advanced Heart Failure Clinic Note     Primary Cardiologist: Dr. [name] Primary HF: Dr. [name]   HPI: [name] [name] is a 67 y.o. 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   Admitted 11/18/16 with ADHF. Echo 11/19/16 with LVEF 15-20%, Mod MR, Mod LAE, Mild RV dilation and moderately reduced function, Moderate TR, PA peak pressure 44 mm Hg. Device interrogation showed recent bout of AF but was back in NSR. Suspect AF contributing to decompensation. Work up planned for possible transplant, RV function probably will not support LVAD. Discharge weight was 225 pounds on 11/27/16.   In 12/18 he had R/L heart cath with 3v CAD and low filling pressures/low output.   Today he returns for HF follow up with his wife. Doing well. Still wearing boot on RLE due to fracture but gets it off next week. Has transplant work-up next week at [facility]. Keeping fluid in check. Has gained some weight but thinks it is due to his weight. Weight up to 223 at home. Was about 216. Has paramedicine following. Denies SOB, orthopnea or PND. No CP. CBGs improved 120-240. HgbA1c 7.6 at 05/24/17. Still struggling with neuropathy Gets around house without problem. Takes torsemide everyday except Sunday. BP remains 82-108. No dizziness    Studies:  CT ABD/Chest  IMPRESSION: 1. Mild cardiomegaly with what appears to be mild interstitial pulmonary edema, suggesting a background of mild congestive heart failure. 2. Aortic atherosclerosis, in addition to three-vessel coronary artery disease. Please note that although the presence of coronary artery calcium documents the presence of coronary artery disease, the severity of this disease and any potential stenosis cannot be assessed on this non-gated CT examination. Assessment for potential risk factor modification, dietary therapy or pharmacologic  therapy may be warranted, if clinically indicated. 3. There are mild calcifications of the aortic valve. Echocardiographic correlation for evaluation of potential valvular dysfunction may be warranted if clinically indicated.  CPX 12/18: Resting HR: 57 Peak HR: 67  (44% age predicted max HR) BP rest: 90/64 BP peak: 78/58 Peak VO2: 9.3 (41% predicted peak VO2) VE/VCO2 slope: 53 OUES: 1.09 Peak RER: 0.90  R/L Cath 12/18: 1) LAD diffuse 60% stenosis 2) LCX multiple 40% lesions 3) RCA chronically occluded proximally with L to R collaterals  Ao = 99/56 (73) LV = 101/5 RA = 1 RV = 29/1 PA = 23/11 (16) PCW = 6 Fick cardiac output/index = 3.8/1.7 Thermo CO/CI = 4.1/1.9 PVR = 2.6 Ao sat = 99% PA sat = 60%, 61% PaPI = 12 RA/PCWP = 0.16  Review of systems complete and found to be negative unless listed in HPI.    Past Medical History:  Diagnosis Date  . AICD (automatic cardioverter/defibrillator) present 02/05/2014   Upgrade to Medtronic biventricular ICD, serial number  [id number]   . Atrial flutter ([place]) 04/2012   s/p TEE-EPS+RFCA 04/2012  . CAD (coronary artery disease) 9242,6834 X 2    RCA-T, 70% PL (off CFX), 99% Prox LAD/90% Dist LAD, S/P TAXUS stent x 2  . CHF (congestive heart failure) ([place])   . Chronic anticoagulation   . Chronic systolic heart failure ([place])   . CKD (chronic kidney disease)   . Diabetic retinopathy ([place])   . DM type 2 (diabetes mellitus, type 2) ([facility])    insulin dependent  . HTN (hypertension)   .  Hypercholesteremia    ablation  . ICD (implantable cardiac defibrillator) in place   . Ischemic cardiomyopathy March 2015   20-25% 2D   . Nephrolithiasis   . Ventricular tachycardia ([place])     Current Outpatient Medications  Medication Sig Dispense Refill  . apixaban (ELIQUIS) 5 MG TABS tablet Take 1 tablet (5 mg total) by mouth 2 (two) times daily. 60 tablet 6  . atorvastatin (LIPITOR) 80 MG tablet TAKE ONE TABLET BY MOUTH ONCE DAILY (Patient  taking differently: TAKE 80 MG BY MOUTH ONCE DAILY) 90 tablet 3  . digoxin (LANOXIN) 0.125 MG tablet Take 1 tablet (0.125 mg total) by mouth daily. 90 tablet 3  . Dulaglutide (TRULICITY) 1.5 NI/6.2VO SOPN Inject contents of pen weekly 4 pen 2  . gabapentin (NEURONTIN) 300 MG capsule Take 1 capsule (300 mg total) by mouth 3 (three) times daily. 270 capsule 3  . insulin NPH-regular Human (NOVOLIN 70/30 RELION) (70-30) 100 UNIT/ML injection Inject 30 Units into the skin 2 (two) times daily with a meal. 10 mL 12  . losartan (COZAAR) 25 MG tablet Take 12.5 mg by mouth at bedtime.    . magnesium oxide (MAG-OX) 400 MG tablet Take 1 tablet (400 mg total) by mouth daily. 90 tablet 3  . metFORMIN (GLUCOPHAGE-XR) 750 MG 24 hr tablet TAKE 2 TABLETS BY MOUTH ONCE DAILY WITH BREAKFAST (Patient taking differently: Take 750 mg by mouth. TAKE 1 TABLETS BY MOUTH DAILY WITH BREAKFAST AND 1 TABLET AT DINNER.) 180 tablet 0  . Multiple Vitamins-Minerals (MULTIVITAMIN PO) Take 1 capsule by mouth daily.     Marland Kitchen OVER THE COUNTER MEDICATION Apply 1 application topically daily. Epsom Salt Foot Gel    . sotalol (BETAPACE) 120 MG tablet TAKE ONE TABLET BY MOUTH TWICE DAILY (Patient taking differently: TAKE 120MG  BY MOUTH TWICE DAILY) 180 tablet 3  . torsemide (DEMADEX) 100 MG tablet Take 50 mg by mouth daily. Every day EXCEPT Sunday     No current facility-administered medications for this encounter.     Allergies  Allergen Reactions  . Penicillins Hives, Itching and Other (See Comments)    Has patient had a PCN reaction causing immediate rash, facial/tongue/throat swelling, SOB or lightheadedness with hypotension: Yes Has patient had a PCN reaction causing severe rash involving mucus membranes or skin necrosis: No Has patient had a PCN reaction that required hospitalization: No Has patient had a PCN reaction occurring within the last 10 years: No If all of the above answers are "NO", then may proceed with Cephalosporin  use.       Social History   Socioeconomic History  . Marital status: Married    Spouse name: Not on file  . Number of children: 4  . Years of education: 86  . Highest education level: Not on file  Occupational History  . Occupation: Psychiatrist - currently unemployed  Social Needs  . Financial resource strain: Not on file  . Food insecurity:    Worry: Not on file    Inability: Not on file  . Transportation needs:    Medical: Not on file    Non-medical: Not on file  Tobacco Use  . Smoking status: Former Smoker    Packs/day: 1.00    Years: 29.00    Pack years: 29.00    Types: Cigarettes    Last attempt to quit: 03/02/2000    Years since quitting: 17.3  . Smokeless tobacco: Never Used  Substance and Sexual Activity  . Alcohol use: No  Alcohol/week: 0.0 oz  . Drug use: No  . Sexual activity: Not on file  Lifestyle  . Physical activity:    Days per week: Not on file    Minutes per session: Not on file  . Stress: Not on file  Relationships  . Social connections:    Talks on phone: Not on file    Gets together: Not on file    Attends religious service: Not on file    Active member of club or organization: Not on file    Attends meetings of clubs or organizations: Not on file    Relationship status: Not on file  . Intimate partner violence:    Fear of current or ex partner: Not on file    Emotionally abused: Not on file    Physically abused: Not on file    Forced sexual activity: Not on file  Other Topics Concern  . Not on file  Social History Narrative   Pt lives with wife in a one story home - married for 73 years. He hunts regularly and is active, walking > 1 mile at times without difficulty.  Has 4 children.     Retired Horticulturist, commercial.  Education: some college.   Family History  Problem Relation Age of Onset  . Heart failure Father        Deceased  . Alzheimer's disease Mother        Living  . Heart attack Mother   . CAD Brother   . Other Brother         blood cancer  . CAD Brother   . Prostate cancer Brother   . CAD Brother   . CAD Brother   . Healthy Son   . Healthy Daughter   . Diabetes Brother   . Stomach cancer Neg Hx   . Colon cancer Neg Hx   \   \  Vitals:   06/24/17 1021  BP: 98/62  Pulse: (!) 58  SpO2: 98%  Weight: 223 lb (101.2 kg)   Wt Readings from Last 3 Encounters:  06/24/17 223 lb (101.2 kg)  06/04/17 223 lb 11.2 oz (101.5 kg)  06/03/17 230 lb (104.3 kg)    PHYSICAL EXAM: General:  Well appearing. No resp difficulty HEENT: normal Neck: supple. JVP 6  Carotids 2+ bilat; no bruits. No lymphadenopathy or thryomegaly appreciated. Cor: PMI laterally displaced. Regular rate & rhythm. No rubs, gallops or murmurs. Lungs: clear Abdomen: obese soft, nontender, nondistended. No hepatosplenomegaly. No bruits or masses. Good bowel sounds. Extremities: no cyanosis, clubbing, rash, edema. Boot on RLE Neuro: alert & orientedx3, cranial nerves grossly intact. moves all 4 extremities w/o difficulty. Affect pleasant   ASSESSMENT & PLAN: 1. Chronic systolic CHF: - Echo 3/82/50 EF 15-20% biventricular Failure with moderate RV dysfunction (likely combined ICM/NICM). He has Medtronic BiV ICD in place.  - Cath 12/18 with stable 1v CAD. Low filling pressures with CI 1.7  - CPX testing 12/18 with submax test but severe HF limitation with exertional hypotension. Peak VO2: 9.3 (41% predicted peak VO2). VE/VCO2 slope: 53 - NYHA II-III today. Stable.  - Volume status looks good  - Has [facility] evaluation at [facility] next week. Blood sugars now with improved control. Will get CT colon[facility] at [facility]. - Continue losartan 12.5. BP too low to titrate or switch to Entresto - off b-blocker due to low output and low BP and bradycardia - continue digoxin 0.125 - On sotalol for VT. Continue for now.  - Blood Type  O+  - Continue Paramedicine support  2. [name]  - recent cath with stable CAD as above - No s/s  ischemia - Off ASA with apixaban. Continue statin   3. [name] - Recent A1c was 11.7 now 7.6. Congratulated him on improvement -Followed by Endocrinology.   4. CKD II-III - Creatinine stable 1.2 today  6. [name] - s/p ICD. Quiescent - Continue sotalol.   7. PAF s/p ablation 04/2012 - Remains in NSR - Continue Eliquis. No evidence of bleeding. Hgb 12.3 today   Glori Bickers, MD 06/24/17 06/24/17

## 2017-06-24 NOTE — Patient Instructions (Signed)
PYP Scan   Your physician recommends that you schedule a follow-up appointment in: 2 months

## 2017-06-24 NOTE — Progress Notes (Signed)
Paramedicine Encounter   Patient ID: [name] [name] , male,   DOB: 02-11-1951,67 y.o.,  MRN: [id number]  Mr Ludlum was seen at the HF clinic today and reported feeling well. He is set to come out of his ankle boot off next week and he is excited to start walking and being active outside. His blood sugar has been trending around 200 but he had a increase in his Trulicity dose and his most recent A1c was significantly improved. He is anticipating going to [facility] for his transplant evaluation from May 6-9 . Doctor Bensimhon wants a nuclear scan to check for amyloids which could be causing his neuropathy. No medication changes at this time.  Time spent with patient: 43 minutes  Jacquiline Doe, EMT 06/24/2017   ACTION: Home visit completed Next visit planned for 1 week

## 2017-06-24 NOTE — H&P (View-Only) (Signed)
Advanced Heart Failure Clinic Note     Primary Cardiologist: Dr. [name] Primary HF: Dr. [name]   HPI: [name] [name] is a 67 y.o. 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   Admitted 11/18/16 with ADHF. Echo 11/19/16 with LVEF 15-20%, Mod MR, Mod LAE, Mild RV dilation and moderately reduced function, Moderate TR, PA peak pressure 44 mm Hg. Device interrogation showed recent bout of AF but was back in NSR. Suspect AF contributing to decompensation. Work up planned for possible transplant, RV function probably will not support LVAD. Discharge weight was 225 pounds on 11/27/16.   In 12/18 he had R/L heart cath with 3v CAD and low filling pressures/low output.   Today he returns for HF follow up with his wife. Doing well. Still wearing boot on RLE due to fracture but gets it off next week. Has transplant work-up next week at [facility]. Keeping fluid in check. Has gained some weight but thinks it is due to his weight. Weight up to 223 at home. Was about 216. Has paramedicine following. Denies SOB, orthopnea or PND. No CP. CBGs improved 120-240. HgbA1c 7.6 at 05/24/17. Still struggling with neuropathy Gets around house without problem. Takes torsemide everyday except Sunday. BP remains 82-108. No dizziness    Studies:  CT ABD/Chest  IMPRESSION: 1. Mild cardiomegaly with what appears to be mild interstitial pulmonary edema, suggesting a background of mild congestive heart failure. 2. Aortic atherosclerosis, in addition to three-vessel coronary artery disease. Please note that although the presence of coronary artery calcium documents the presence of coronary artery disease, the severity of this disease and any potential stenosis cannot be assessed on this non-gated CT examination. Assessment for potential risk factor modification, dietary therapy or pharmacologic  therapy may be warranted, if clinically indicated. 3. There are mild calcifications of the aortic valve. Echocardiographic correlation for evaluation of potential valvular dysfunction may be warranted if clinically indicated.  CPX 12/18: Resting HR: 57 Peak HR: 67  (44% age predicted max HR) BP rest: 90/64 BP peak: 78/58 Peak VO2: 9.3 (41% predicted peak VO2) VE/VCO2 slope: 53 OUES: 1.09 Peak RER: 0.90  R/L Cath 12/18: 1) LAD diffuse 60% stenosis 2) LCX multiple 40% lesions 3) RCA chronically occluded proximally with L to R collaterals  Ao = 99/56 (73) LV = 101/5 RA = 1 RV = 29/1 PA = 23/11 (16) PCW = 6 Fick cardiac output/index = 3.8/1.7 Thermo CO/CI = 4.1/1.9 PVR = 2.6 Ao sat = 99% PA sat = 60%, 61% PaPI = 12 RA/PCWP = 0.16  Review of systems complete and found to be negative unless listed in HPI.    Past Medical History:  Diagnosis Date  . AICD (automatic cardioverter/defibrillator) present 02/05/2014   Upgrade to Medtronic biventricular ICD, serial number  [id number]   . Atrial flutter ([place]) 04/2012   s/p TEE-EPS+RFCA 04/2012  . CAD (coronary artery disease) 0973,5329 X 2    RCA-T, 70% PL (off CFX), 99% Prox LAD/90% Dist LAD, S/P TAXUS stent x 2  . CHF (congestive heart failure) ([place])   . Chronic anticoagulation   . Chronic systolic heart failure ([place])   . CKD (chronic kidney disease)   . Diabetic retinopathy ([place])   . DM type 2 (diabetes mellitus, type 2) (HCC)    insulin dependent  . HTN (hypertension)   .  Hypercholesteremia    ablation  . ICD (implantable cardiac defibrillator) in place   . Ischemic cardiomyopathy March 2015   20-25% 2D   . Nephrolithiasis   . Ventricular tachycardia ([place])     Current Outpatient Medications  Medication Sig Dispense Refill  . apixaban (ELIQUIS) 5 MG TABS tablet Take 1 tablet (5 mg total) by mouth 2 (two) times daily. 60 tablet 6  . atorvastatin (LIPITOR) 80 MG tablet TAKE ONE TABLET BY MOUTH ONCE DAILY (Patient  taking differently: TAKE 80 MG BY MOUTH ONCE DAILY) 90 tablet 3  . digoxin (LANOXIN) 0.125 MG tablet Take 1 tablet (0.125 mg total) by mouth daily. 90 tablet 3  . Dulaglutide (TRULICITY) 1.5 ST/4.1DQ SOPN Inject contents of pen weekly 4 pen 2  . gabapentin (NEURONTIN) 300 MG capsule Take 1 capsule (300 mg total) by mouth 3 (three) times daily. 270 capsule 3  . insulin NPH-regular Human (NOVOLIN 70/30 RELION) (70-30) 100 UNIT/ML injection Inject 30 Units into the skin 2 (two) times daily with a meal. 10 mL 12  . losartan (COZAAR) 25 MG tablet Take 12.5 mg by mouth at bedtime.    . magnesium oxide (MAG-OX) 400 MG tablet Take 1 tablet (400 mg total) by mouth daily. 90 tablet 3  . metFORMIN (GLUCOPHAGE-XR) 750 MG 24 hr tablet TAKE 2 TABLETS BY MOUTH ONCE DAILY WITH BREAKFAST (Patient taking differently: Take 750 mg by mouth. TAKE 1 TABLETS BY MOUTH DAILY WITH BREAKFAST AND 1 TABLET AT DINNER.) 180 tablet 0  . Multiple Vitamins-Minerals (MULTIVITAMIN PO) Take 1 capsule by mouth daily.     Marland Kitchen OVER THE COUNTER MEDICATION Apply 1 application topically daily. Epsom Salt Foot Gel    . sotalol (BETAPACE) 120 MG tablet TAKE ONE TABLET BY MOUTH TWICE DAILY (Patient taking differently: TAKE 120MG  BY MOUTH TWICE DAILY) 180 tablet 3  . torsemide (DEMADEX) 100 MG tablet Take 50 mg by mouth daily. Every day EXCEPT Sunday     No current facility-administered medications for this encounter.     Allergies  Allergen Reactions  . Penicillins Hives, Itching and Other (See Comments)    Has patient had a PCN reaction causing immediate rash, facial/tongue/throat swelling, SOB or lightheadedness with hypotension: Yes Has patient had a PCN reaction causing severe rash involving mucus membranes or skin necrosis: No Has patient had a PCN reaction that required hospitalization: No Has patient had a PCN reaction occurring within the last 10 years: No If all of the above answers are "NO", then may proceed with Cephalosporin  use.       Social History   Socioeconomic History  . Marital status: Married    Spouse name: Not on file  . Number of children: 4  . Years of education: 60  . Highest education level: Not on file  Occupational History  . Occupation: Psychiatrist - currently unemployed  Social Needs  . Financial resource strain: Not on file  . Food insecurity:    Worry: Not on file    Inability: Not on file  . Transportation needs:    Medical: Not on file    Non-medical: Not on file  Tobacco Use  . Smoking status: Former Smoker    Packs/day: 1.00    Years: 29.00    Pack years: 29.00    Types: Cigarettes    Last attempt to quit: 03/02/2000    Years since quitting: 17.3  . Smokeless tobacco: Never Used  Substance and Sexual Activity  . Alcohol use: No  Alcohol/week: 0.0 oz  . Drug use: No  . Sexual activity: Not on file  Lifestyle  . Physical activity:    Days per week: Not on file    Minutes per session: Not on file  . Stress: Not on file  Relationships  . Social connections:    Talks on phone: Not on file    Gets together: Not on file    Attends religious service: Not on file    Active member of club or organization: Not on file    Attends meetings of clubs or organizations: Not on file    Relationship status: Not on file  . Intimate partner violence:    Fear of current or ex partner: Not on file    Emotionally abused: Not on file    Physically abused: Not on file    Forced sexual activity: Not on file  Other Topics Concern  . Not on file  Social History Narrative   Pt lives with wife in a one story home - married for 45 years. He hunts regularly and is active, walking > 1 mile at times without difficulty.  Has 4 children.     Retired Horticulturist, commercial.  Education: some college.   Family History  Problem Relation Age of Onset  . Heart failure Father        Deceased  . Alzheimer's disease Mother        Living  . Heart attack Mother   . CAD Brother   . Other Brother         blood cancer  . CAD Brother   . Prostate cancer Brother   . CAD Brother   . CAD Brother   . Healthy Son   . Healthy Daughter   . Diabetes Brother   . Stomach cancer Neg Hx   . Colon cancer Neg Hx   \   \  Vitals:   06/24/17 1021  BP: 98/62  Pulse: (!) 58  SpO2: 98%  Weight: 223 lb (101.2 kg)   Wt Readings from Last 3 Encounters:  06/24/17 223 lb (101.2 kg)  06/04/17 223 lb 11.2 oz (101.5 kg)  06/03/17 230 lb (104.3 kg)    PHYSICAL EXAM: General:  Well appearing. No resp difficulty HEENT: normal Neck: supple. JVP 6  Carotids 2+ bilat; no bruits. No lymphadenopathy or thryomegaly appreciated. Cor: PMI laterally displaced. Regular rate & rhythm. No rubs, gallops or murmurs. Lungs: clear Abdomen: obese soft, nontender, nondistended. No hepatosplenomegaly. No bruits or masses. Good bowel sounds. Extremities: no cyanosis, clubbing, rash, edema. Boot on RLE Neuro: alert & orientedx3, cranial nerves grossly intact. moves all 4 extremities w/o difficulty. Affect pleasant   ASSESSMENT & PLAN: 1. Chronic systolic CHF: - Echo 0/16/01 EF 15-20% biventricular Failure with moderate RV dysfunction (likely combined ICM/NICM). He has Medtronic BiV ICD in place.  - Cath 12/18 with stable 1v CAD. Low filling pressures with CI 1.7  - CPX testing 12/18 with submax test but severe HF limitation with exertional hypotension. Peak VO2: 9.3 (41% predicted peak VO2). VE/VCO2 slope: 53 - NYHA II-III today. Stable.  - Volume status looks good  - Has transplant evaluation at [facility] next week. Blood sugars now with improved control. Will get CT colonography at [facility]. - Continue losartan 12.5. BP too low to titrate or switch to Entresto - off b-blocker due to low output and low BP and bradycardia - continue digoxin 0.125 - On sotalol for VT. Continue for now.  - Blood Type  O+  - Continue Paramedicine support  2. [name]  - recent cath with stable CAD as above - No s/s  ischemia - Off ASA with apixaban. Continue statin   3. [name] - Recent A1c was 11.7 now 7.6. Congratulated him on improvement -Followed by Endocrinology.   4. CKD II-III - Creatinine stable 1.2 today  6. [name] - s/p ICD. Quiescent - Continue sotalol.   7. PAF s/p ablation 04/2012 - Remains in NSR - Continue Eliquis. No evidence of bleeding. Hgb 12.3 today   Glori Bickers, MD 06/24/17 06/24/17

## 2017-06-25 ENCOUNTER — Other Ambulatory Visit: Payer: Self-pay | Admitting: Internal Medicine

## 2017-06-29 DIAGNOSIS — S8264XD Nondisplaced fracture of lateral malleolus of right fibula, subsequent encounter for closed fracture with routine healing: Secondary | ICD-10-CM | POA: Diagnosis not present

## 2017-06-30 ENCOUNTER — Encounter (HOSPITAL_COMMUNITY)
Admission: RE | Admit: 2017-06-30 | Discharge: 2017-06-30 | Disposition: A | Discharge status: Home / Self Care | Payer: PPO | Source: Ambulatory Visit | Attending: Internal Medicine | Admitting: Internal Medicine

## 2017-06-30 ENCOUNTER — Encounter (HOSPITAL_COMMUNITY): Payer: PPO

## 2017-06-30 ENCOUNTER — Other Ambulatory Visit: Payer: Self-pay | Admitting: Cardiology

## 2017-06-30 DIAGNOSIS — I519 Heart disease, unspecified: Secondary | ICD-10-CM | POA: Insufficient documentation

## 2017-06-30 DIAGNOSIS — R0602 Shortness of breath: Secondary | ICD-10-CM | POA: Diagnosis not present

## 2017-06-30 MED ORDER — TECHNETIUM TC 99M PYROPHOSPHATE
20.0000 | Freq: Once | INTRAVENOUS | Status: AC
  Administered 2017-06-30: 20 via INTRAVENOUS
  Filled 2017-06-30: qty 20

## 2017-07-01 ENCOUNTER — Telehealth (HOSPITAL_COMMUNITY): Payer: Self-pay

## 2017-07-01 ENCOUNTER — Ambulatory Visit (INDEPENDENT_AMBULATORY_CARE_PROVIDER_SITE_OTHER): Payer: PPO

## 2017-07-01 DIAGNOSIS — Z95810 Presence of automatic (implantable) cardiac defibrillator: Secondary | ICD-10-CM

## 2017-07-01 DIAGNOSIS — I5022 Chronic systolic (congestive) heart failure: Secondary | ICD-10-CM

## 2017-07-01 NOTE — Telephone Encounter (Signed)
I called [name] [name] to schedule an appointment. He stated he was not able to meet today but could meet early tomorrow. I already had a full morning of appointment so he said he would call me around 15:00 tomorrow and let me know if he was home.

## 2017-07-01 NOTE — Progress Notes (Signed)
EPIC Encounter for ICM Monitoring  Patient Name: [name] [name] is a 67 y.o. male Date: 07/01/2017 Primary Care Physican: [name], NP Primary Cardiologist:[name] Electrophysiologist: [name] Dry Weight: 226 lbs (Baseline216-220 lbs) Bi-V Pacing:97.4%      Heart Failure questions reviewed, pt asymptomatic.  Being evaluated at [facility] for heart transplant.   Thoracic impedance abnormal suggesting fluid accumulation since 06/17/2017.   Per HF clinic office note 06/24/2017, patient was not fluid overloaded.  Prescribed dosage: Torsemide 100 mgtake 50 mg by mouth every day except Sundays.  Labs: 06/24/2017 Creatinine 1.23, BUN 26, Potassium 4.0, Sodium 136, EGFR 59->60 05/24/2017 Creatinine 0.98, BUN 20, Potassium 3.8, Sodium 136, EGFR >60  04/21/2017 Creatinine 1.09, BUN 25, Potassium 3.9, Sodium 139, EGFR >60  04/09/2017 Creatinine 1.52, BUN 30, Potassium 3.8, Sodium 136, EGFR 46-53  03/29/2017 Creatinine 1.01, BUN 21, Potassium 4.1, Sodium 137  03/19/2017 Creatinine1.34, BUN30, Potassium5.2, [id number], EGFR53->60 02/17/2017 Creatinine1.44, BUN22, Potassium4.5, [id number], [id number]  02/05/2017 Creatinine1.37, BUN29, Potassium4.9, Sodium134, EGFR52->60  01/27/2017 Creatinine1.47, BUN44, Potassium4.7, Sodium132, [id number]  01/15/2017 Creatinine1.13, BUN25, Potassium4.7, Sodium131, EGFR>60 A complete set of results can be found in results review.  Recommendations: No changes.   Encouraged to call for fluid symptoms.  Follow-up plan: ICM clinic phone appointment on 08/02/2017.    Copy of ICM check sent to Dr. [name] and Dr. [name].   3 month ICM trend: 07/01/2017    1 Year ICM trend:       [name], RN 07/01/2017 12:00 PM

## 2017-07-05 DIAGNOSIS — R931 Abnormal findings on diagnostic imaging of heart and coronary circulation: Secondary | ICD-10-CM | POA: Diagnosis not present

## 2017-07-05 DIAGNOSIS — Z7682 Awaiting organ transplant status: Secondary | ICD-10-CM | POA: Diagnosis not present

## 2017-07-05 DIAGNOSIS — R799 Abnormal finding of blood chemistry, unspecified: Secondary | ICD-10-CM | POA: Diagnosis not present

## 2017-07-05 DIAGNOSIS — Z125 Encounter for screening for malignant neoplasm of prostate: Secondary | ICD-10-CM | POA: Diagnosis not present

## 2017-07-05 DIAGNOSIS — Z01810 Encounter for preprocedural cardiovascular examination: Secondary | ICD-10-CM | POA: Diagnosis not present

## 2017-07-05 DIAGNOSIS — Z113 Encounter for screening for infections with a predominantly sexual mode of transmission: Secondary | ICD-10-CM | POA: Diagnosis not present

## 2017-07-05 DIAGNOSIS — I5041 Acute combined systolic (congestive) and diastolic (congestive) heart failure: Secondary | ICD-10-CM | POA: Diagnosis not present

## 2017-07-05 DIAGNOSIS — R0609 Other forms of dyspnea: Secondary | ICD-10-CM | POA: Diagnosis not present

## 2017-07-06 ENCOUNTER — Ambulatory Visit: Payer: PPO | Admitting: Endocrinology

## 2017-07-06 DIAGNOSIS — R0600 Dyspnea, unspecified: Secondary | ICD-10-CM | POA: Diagnosis not present

## 2017-07-06 DIAGNOSIS — Z113 Encounter for screening for infections with a predominantly sexual mode of transmission: Secondary | ICD-10-CM | POA: Diagnosis not present

## 2017-07-06 DIAGNOSIS — Z01810 Encounter for preprocedural cardiovascular examination: Secondary | ICD-10-CM | POA: Diagnosis not present

## 2017-07-06 DIAGNOSIS — I5041 Acute combined systolic (congestive) and diastolic (congestive) heart failure: Secondary | ICD-10-CM | POA: Diagnosis not present

## 2017-07-06 DIAGNOSIS — R931 Abnormal findings on diagnostic imaging of heart and coronary circulation: Secondary | ICD-10-CM | POA: Diagnosis not present

## 2017-07-06 DIAGNOSIS — R799 Abnormal finding of blood chemistry, unspecified: Secondary | ICD-10-CM | POA: Diagnosis not present

## 2017-07-06 DIAGNOSIS — Z125 Encounter for screening for malignant neoplasm of prostate: Secondary | ICD-10-CM | POA: Diagnosis not present

## 2017-07-06 DIAGNOSIS — R918 Other nonspecific abnormal finding of lung field: Secondary | ICD-10-CM | POA: Diagnosis not present

## 2017-07-06 DIAGNOSIS — I517 Cardiomegaly: Secondary | ICD-10-CM | POA: Diagnosis not present

## 2017-07-06 DIAGNOSIS — R0609 Other forms of dyspnea: Secondary | ICD-10-CM | POA: Diagnosis not present

## 2017-07-07 DIAGNOSIS — I5041 Acute combined systolic (congestive) and diastolic (congestive) heart failure: Secondary | ICD-10-CM | POA: Diagnosis not present

## 2017-07-07 DIAGNOSIS — Z125 Encounter for screening for malignant neoplasm of prostate: Secondary | ICD-10-CM | POA: Diagnosis not present

## 2017-07-07 DIAGNOSIS — Z87891 Personal history of nicotine dependence: Secondary | ICD-10-CM | POA: Diagnosis not present

## 2017-07-07 DIAGNOSIS — I088 Other rheumatic multiple valve diseases: Secondary | ICD-10-CM | POA: Diagnosis not present

## 2017-07-07 DIAGNOSIS — Z008 Encounter for other general examination: Secondary | ICD-10-CM | POA: Diagnosis not present

## 2017-07-07 DIAGNOSIS — I519 Heart disease, unspecified: Secondary | ICD-10-CM | POA: Diagnosis not present

## 2017-07-07 DIAGNOSIS — Z7682 Awaiting organ transplant status: Secondary | ICD-10-CM | POA: Diagnosis not present

## 2017-07-07 DIAGNOSIS — Z113 Encounter for screening for infections with a predominantly sexual mode of transmission: Secondary | ICD-10-CM | POA: Diagnosis not present

## 2017-07-07 DIAGNOSIS — E119 Type 2 diabetes mellitus without complications: Secondary | ICD-10-CM | POA: Diagnosis not present

## 2017-07-07 DIAGNOSIS — Z01810 Encounter for preprocedural cardiovascular examination: Secondary | ICD-10-CM | POA: Diagnosis not present

## 2017-07-07 DIAGNOSIS — R931 Abnormal findings on diagnostic imaging of heart and coronary circulation: Secondary | ICD-10-CM | POA: Diagnosis not present

## 2017-07-07 DIAGNOSIS — I255 Ischemic cardiomyopathy: Secondary | ICD-10-CM | POA: Diagnosis not present

## 2017-07-07 DIAGNOSIS — R0609 Other forms of dyspnea: Secondary | ICD-10-CM | POA: Diagnosis not present

## 2017-07-07 DIAGNOSIS — R69 Illness, unspecified: Secondary | ICD-10-CM | POA: Diagnosis not present

## 2017-07-07 DIAGNOSIS — I517 Cardiomegaly: Secondary | ICD-10-CM | POA: Diagnosis not present

## 2017-07-07 DIAGNOSIS — I5022 Chronic systolic (congestive) heart failure: Secondary | ICD-10-CM | POA: Diagnosis not present

## 2017-07-07 DIAGNOSIS — R799 Abnormal finding of blood chemistry, unspecified: Secondary | ICD-10-CM | POA: Diagnosis not present

## 2017-07-08 ENCOUNTER — Telehealth (HOSPITAL_COMMUNITY): Payer: Self-pay | Admitting: *Deleted

## 2017-07-08 DIAGNOSIS — I5022 Chronic systolic (congestive) heart failure: Secondary | ICD-10-CM | POA: Diagnosis not present

## 2017-07-08 DIAGNOSIS — I255 Ischemic cardiomyopathy: Secondary | ICD-10-CM | POA: Diagnosis not present

## 2017-07-08 NOTE — Telephone Encounter (Signed)
-----   Message from Jolaine Artist, MD sent at 06/30/2017 11:37 PM EDT ----- Please check genetic testing and forward this test to [facility]. Thanks

## 2017-07-08 NOTE — Telephone Encounter (Signed)
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    Spoke w/pt he is aware and agreeable, his wife is tied up most of next week and can not bring pt for [place] until Fri 5/17, will sch and call her wife on Monday with instructions and time, will do genetic testing when pt comes for RHC

## 2017-07-13 NOTE — Telephone Encounter (Signed)
Not sure Fri will work w/Dr Bensimhon's sch have left pt's wife a mess to call us back, want to see if they could do 5/21 or 5/23

## 2017-07-14 DIAGNOSIS — Z008 Encounter for other general examination: Secondary | ICD-10-CM | POA: Diagnosis not present

## 2017-07-14 DIAGNOSIS — R69 Illness, unspecified: Secondary | ICD-10-CM | POA: Diagnosis not present

## 2017-07-14 DIAGNOSIS — Z7682 Awaiting organ transplant status: Secondary | ICD-10-CM | POA: Diagnosis not present

## 2017-07-16 NOTE — Telephone Encounter (Signed)
Pt's RHC sch for 5/23 at 11:30 AM, will call pt back w/instructins regarding medications.

## 2017-07-20 ENCOUNTER — Encounter (HOSPITAL_COMMUNITY): Payer: Self-pay | Admitting: *Deleted

## 2017-07-20 ENCOUNTER — Other Ambulatory Visit (HOSPITAL_COMMUNITY): Payer: Self-pay | Admitting: *Deleted

## 2017-07-20 DIAGNOSIS — I5022 Chronic systolic (congestive) heart failure: Secondary | ICD-10-CM

## 2017-07-20 NOTE — Telephone Encounter (Signed)
Patient's wife called back and instructions reviewed with her.  No further questions at this time.

## 2017-07-20 NOTE — Telephone Encounter (Signed)
Per Dr [name] pt needs to hold Eliquis the night before and morning of cath.  Attempted to call pt to review instructions with him and Left message to call back

## 2017-07-21 ENCOUNTER — Other Ambulatory Visit (HOSPITAL_COMMUNITY): Payer: Self-pay

## 2017-07-21 ENCOUNTER — Other Ambulatory Visit: Payer: Self-pay | Admitting: Nurse Practitioner

## 2017-07-21 DIAGNOSIS — E0842 Diabetes mellitus due to underlying condition with diabetic polyneuropathy: Secondary | ICD-10-CM

## 2017-07-21 NOTE — Progress Notes (Signed)
Paramedicine Encounter    Patient ID: [name] [name], male    DOB: 29-Oct-1950, 67 y.o.   MRN: [id number]   Patient Care Team: [name], NP as PCP - General (Nurse Practitioner)  Patient Active Problem List   Diagnosis Date Noted  . Acute on chronic combined systolic and diastolic CHF (congestive heart failure) ([place]) 11/19/2016  . Acute on chronic left and right systolic w/ diastolic heart failure, NYHA class 2 ([place]) 11/18/2016  . Hx of atrial flutter 11/18/2016  . CAD (coronary artery disease)-RCA-T, 70% PL (off CFX), 99% Prox LAD/90% Dist LAD, S/P TAXUS stent x 2 11/18/2016  . HTN (hypertension) 11/18/2016  . Diabetes mellitus with complication ([place]) 37/16/9678  . Thrombocytopenia ([place]) 11/18/2016  . Bilateral lower extremity edema 11/12/2016  . Diabetic polyneuropathy associated with diabetes mellitus due to underlying condition ([place]) 05/29/2015  . Chronic systolic heart failure ([place]) 02/05/2014  . Medtronic biventricular ICD, serial number  [id number]  02/05/2014  . CAD (coronary artery disease), native coronary artery 01/08/2014  . Chronic renal disease, stage III ([place]) 01/08/2014  . Heart failure ([place]) 12/16/2013  . Mitral regurgitation 04/17/2013  . Chronic systolic dysfunction of left ventricle 06/29/2012  . Atrial flutter ([place]) 06/03/2012  . Acute kidney injury (nontraumatic) ([place]) 05/31/2012  . Chronic anticoagulation   . Acute on chronic systolic heart failure ([place]) 05/21/2012  . Nausea with vomiting 05/18/2012  . PAF (paroxysmal atrial fibrillation) ([place]) 05/17/2012  . Orthopnea 05/17/2012  . TIA (transient ischemic attack) 05/29/2010  . CHEST PAIN 11/13/2008  . VENTRICULAR TACHYCARDIA 08/14/2008  . Automatic implantable cardioverter-defibrillator in situ 08/14/2008  . Type 2 diabetes with nephropathy ([place]) 08/11/2008  . DYSLIPIDEMIA 08/11/2008  . Cardiomyopathy, ischemic 08/11/2008  . Disorder resulting from impaired renal function 08/11/2008  .  HYPERCHOLESTEROLEMIA 04/29/2006  . HYPERTENSION, BENIGN SYSTEMIC 04/29/2006  . MYOCARDIAL INFARCTION, OLD 04/29/2006  . Coronary atherosclerosis 04/29/2006  . NEPHROLITHIASIS 04/29/2006    Current Outpatient Medications:  .  acetaminophen (TYLENOL) 500 MG tablet, Take 1,000 mg by mouth every 6 (six) hours as needed for moderate pain or headache., Disp: , Rfl:  .  apixaban (ELIQUIS) 5 MG TABS tablet, Take 1 tablet (5 mg total) by mouth 2 (two) times daily., Disp: 60 tablet, Rfl: 6 .  atorvastatin (LIPITOR) 80 MG tablet, TAKE ONE TABLET BY MOUTH ONCE DAILY (Patient taking differently: TAKE 80 MG BY MOUTH ONCE DAILY), Disp: 90 tablet, Rfl: 3 .  digoxin (LANOXIN) 0.125 MG tablet, Take 1 tablet (0.125 mg total) by mouth daily. (Patient taking differently: Take 0.125 mg by mouth at bedtime. ), Disp: 90 tablet, Rfl: 3 .  Dulaglutide (TRULICITY) 1.5 LF/8.1OF SOPN, Inject contents of pen weekly (Patient taking differently: Inject 1.5 mg into the skin every Friday. ), Disp: 4 pen, Rfl: 2 .  gabapentin (NEURONTIN) 300 MG capsule, Take 1 capsule (300 mg total) by mouth 3 (three) times daily., Disp: 270 capsule, Rfl: 3 .  insulin NPH-regular Human (NOVOLIN 70/30 RELION) (70-30) 100 UNIT/ML injection, Inject 30 Units into the skin 2 (two) times daily with a meal., Disp: 10 mL, Rfl: 12 .  losartan (COZAAR) 25 MG tablet, Take 12.5 mg by mouth at bedtime., Disp: , Rfl:  .  magnesium oxide (MAG-OX) 400 MG tablet, Take 1 tablet (400 mg total) by mouth daily., Disp: 90 tablet, Rfl: 3 .  metFORMIN (GLUCOPHAGE-XR) 750 MG 24 hr tablet, TAKE 2 TABLETS BY MOUTH ONCE DAILY WITH BREAKFAST (Patient taking differently: Take 1,500 mg by mouth  daily with breakfast. ), Disp: 180 tablet, Rfl: 0 .  Multiple Vitamins-Minerals (MULTIVITAMIN PO), Take 1 capsule by mouth daily. , Disp: , Rfl:  .  sotalol (BETAPACE) 120 MG tablet, TAKE ONE TABLET BY MOUTH TWICE DAILY (Patient taking differently: TAKE 120MG  BY MOUTH TWICE DAILY), Disp:  180 tablet, Rfl: 3 .  torsemide (DEMADEX) 100 MG tablet, Take 50 mg by mouth See admin instructions. Take 50 mg by mouth daily except DO NOT TAKE on Sunday, Disp: , Rfl:  .  DULoxetine (CYMBALTA) 30 MG capsule, Take 30 mg by mouth daily., Disp: , Rfl:  Allergies  Allergen Reactions  . Penicillins Hives, Itching and Other (See Comments)    Has patient had a PCN reaction causing immediate rash, facial/tongue/throat swelling, SOB or lightheadedness with hypotension: Yes Has patient had a PCN reaction causing severe rash involving mucus membranes or skin necrosis: No Has patient had a PCN reaction that required hospitalization: No Has patient had a PCN reaction occurring within the last 10 years: No If all of the above answers are "NO", then may proceed with Cephalosporin use.       Social History   Socioeconomic History  . Marital status: Married    Spouse name: Not on file  . Number of children: 4  . Years of education: 37  . Highest education level: Not on file  Occupational History  . Occupation: Psychiatrist - currently unemployed  Social Needs  . Financial resource strain: Not on file  . Food insecurity:    Worry: Not on file    Inability: Not on file  . Transportation needs:    Medical: Not on file    Non-medical: Not on file  Tobacco Use  . Smoking status: Former Smoker    Packs/day: 1.00    Years: 29.00    Pack years: 29.00    Types: Cigarettes    Last attempt to quit: 03/02/2000    Years since quitting: 17.3  . Smokeless tobacco: Never Used  Substance and Sexual Activity  . Alcohol use: No    Alcohol/week: 0.0 oz  . Drug use: No  . Sexual activity: Not on file  Lifestyle  . Physical activity:    Days per week: Not on file    Minutes per session: Not on file  . Stress: Not on file  Relationships  . Social connections:    Talks on phone: Not on file    Gets together: Not on file    Attends religious service: Not on file    Active member of club or  organization: Not on file    Attends meetings of clubs or organizations: Not on file    Relationship status: Not on file  . Intimate partner violence:    Fear of current or ex partner: Not on file    Emotionally abused: Not on file    Physically abused: Not on file    Forced sexual activity: Not on file  Other Topics Concern  . Not on file  Social History Narrative   Pt lives with wife in a one story home - married for 86 years. He hunts regularly and is active, walking > 1 mile at times without difficulty.  Has 4 children.     Retired Horticulturist, commercial.  Education: some college.    Physical Exam  Constitutional: He is oriented to person, place, and time.  Cardiovascular: Normal rate and regular rhythm.  Pulmonary/Chest: Effort normal and breath sounds normal.  Abdominal: Soft. He  exhibits no distension.  Musculoskeletal: Normal range of motion. He exhibits no edema.  Neurological: He is alert and oriented to person, place, and time.  Skin: Skin is warm and dry.  Psychiatric: He has a normal mood and affect.        Future Appointments  Date Time Provider [place]  08/02/2017  1:10 PM [facility] DEVICE REMOTES [facility]  08/05/2017  9:45 AM Elayne Snare, MD [facility] None  08/06/2017  8:00 AM Narda Amber K, DO [facility] None  08/25/2017 11:20 AM Bensimhon, Shaune Pascal, MD [facility] None    BP 102/66 (BP Location: Left Arm, Patient Position: Sitting, Cuff Size: Large)   Pulse (!) 57   Resp 16   Wt 219 lb 11.2 oz (99.7 kg)   SpO2 96%   BMI 31.52 kg/m   Weight yesterday- 224 lb Last visit weight- 223 lb  [name] [name] was seen at home today and reported feeling well. He denied SOB, headache, dizziness or orthopnea. He reported a day last week when he was feeling weak and tired but said he checked his blood pressure and it was WNL. He has not had any of these episodes since that day. He reported being compliant with his medications and advised that he is trying to  follow a low sodium and sugar diet. He is scheduled for a heart catheterization tomorrow at the request of [facility] transplant services and will know more about his standing after that.  Time spent with patient: 29 minutes  Jacquiline Doe, EMT 07/21/17  ACTION: Home visit completed Next visit planned for 2 weeks

## 2017-07-22 ENCOUNTER — Encounter (HOSPITAL_COMMUNITY): Payer: Self-pay | Admitting: Internal Medicine

## 2017-07-22 ENCOUNTER — Ambulatory Visit (HOSPITAL_COMMUNITY)
Admission: RE | Admit: 2017-07-22 | Discharge: 2017-07-22 | Disposition: A | Discharge status: Home / Self Care | Payer: PPO | Source: Ambulatory Visit | Attending: Internal Medicine | Admitting: Internal Medicine

## 2017-07-22 ENCOUNTER — Ambulatory Visit (HOSPITAL_COMMUNITY)
Admission: RE | Disposition: A | Discharge status: Home / Self Care | Payer: Self-pay | Source: Ambulatory Visit | Attending: Internal Medicine

## 2017-07-22 DIAGNOSIS — Z880 Allergy status to penicillin: Secondary | ICD-10-CM | POA: Diagnosis not present

## 2017-07-22 DIAGNOSIS — Z794 Long term (current) use of insulin: Secondary | ICD-10-CM | POA: Insufficient documentation

## 2017-07-22 DIAGNOSIS — I255 Ischemic cardiomyopathy: Secondary | ICD-10-CM | POA: Diagnosis not present

## 2017-07-22 DIAGNOSIS — E7800 Pure hypercholesterolemia, unspecified: Secondary | ICD-10-CM | POA: Diagnosis not present

## 2017-07-22 DIAGNOSIS — E1122 Type 2 diabetes mellitus with diabetic chronic kidney disease: Secondary | ICD-10-CM | POA: Insufficient documentation

## 2017-07-22 DIAGNOSIS — I2510 Atherosclerotic heart disease of native coronary artery without angina pectoris: Secondary | ICD-10-CM | POA: Diagnosis not present

## 2017-07-22 DIAGNOSIS — N183 Chronic kidney disease, stage 3 (moderate): Secondary | ICD-10-CM | POA: Insufficient documentation

## 2017-07-22 DIAGNOSIS — I5022 Chronic systolic (congestive) heart failure: Secondary | ICD-10-CM

## 2017-07-22 DIAGNOSIS — Z87891 Personal history of nicotine dependence: Secondary | ICD-10-CM | POA: Diagnosis not present

## 2017-07-22 DIAGNOSIS — I5082 Biventricular heart failure: Secondary | ICD-10-CM | POA: Insufficient documentation

## 2017-07-22 DIAGNOSIS — Z7901 Long term (current) use of anticoagulants: Secondary | ICD-10-CM | POA: Diagnosis not present

## 2017-07-22 DIAGNOSIS — Z8249 Family history of ischemic heart disease and other diseases of the circulatory system: Secondary | ICD-10-CM | POA: Diagnosis not present

## 2017-07-22 DIAGNOSIS — Z95810 Presence of automatic (implantable) cardiac defibrillator: Secondary | ICD-10-CM | POA: Diagnosis not present

## 2017-07-22 DIAGNOSIS — E11319 Type 2 diabetes mellitus with unspecified diabetic retinopathy without macular edema: Secondary | ICD-10-CM | POA: Insufficient documentation

## 2017-07-22 DIAGNOSIS — I130 Hypertensive heart and chronic kidney disease with heart failure and stage 1 through stage 4 chronic kidney disease, or unspecified chronic kidney disease: Secondary | ICD-10-CM | POA: Diagnosis not present

## 2017-07-22 HISTORY — PX: RIGHT HEART CATH: CATH118263

## 2017-07-22 LAB — POCT I-STAT 3, VENOUS BLOOD GAS (G3P V)
Acid-base deficit: 1 mmol/L (ref 0.0–2.0)
Acid-base deficit: 2 mmol/L (ref 0.0–2.0)
Bicarbonate: 23.5 mmol/L (ref 20.0–28.0)
Bicarbonate: 24.6 mmol/L (ref 20.0–28.0)
O2 Saturation: 42 %
O2 Saturation: 44 %
PCO2 VEN: 41.4 mmHg — AB (ref 44.0–60.0)
PCO2 VEN: 43 mmHg — AB (ref 44.0–60.0)
PH VEN: 7.365 (ref 7.250–7.430)
PO2 VEN: 25 mmHg — AB (ref 32.0–45.0)
TCO2: 25 mmol/L (ref 22–32)
TCO2: 26 mmol/L (ref 22–32)
pH, Ven: 7.361 (ref 7.250–7.430)
pO2, Ven: 25 mmHg — CL (ref 32.0–45.0)

## 2017-07-22 LAB — CBC
HEMATOCRIT: 37.3 % — AB (ref 39.0–52.0)
Hemoglobin: 11.7 g/dL — ABNORMAL LOW (ref 13.0–17.0)
MCH: 27.9 pg (ref 26.0–34.0)
MCHC: 31.4 g/dL (ref 30.0–36.0)
MCV: 88.8 fL (ref 78.0–100.0)
Platelets: 249 10*3/uL (ref 150–400)
RBC: 4.2 MIL/uL — ABNORMAL LOW (ref 4.22–5.81)
RDW: 14.8 % (ref 11.5–15.5)
WBC: 5.3 10*3/uL (ref 4.0–10.5)

## 2017-07-22 LAB — BASIC METABOLIC PANEL
ANION GAP: 9 (ref 5–15)
BUN: 29 mg/dL — AB (ref 6–20)
CALCIUM: 9.1 mg/dL (ref 8.9–10.3)
CO2: 26 mmol/L (ref 22–32)
Chloride: 106 mmol/L (ref 101–111)
Creatinine, Ser: 1.33 mg/dL — ABNORMAL HIGH (ref 0.61–1.24)
GFR calc Af Amer: >60 mL/min (ref >60)
GFR, EST NON AFRICAN AMERICAN: 54 mL/min — AB (ref >60)
Glucose, Bld: 161 mg/dL — ABNORMAL HIGH (ref 65–99)
POTASSIUM: 4.5 mmol/L (ref 3.5–5.1)
SODIUM: 141 mmol/L (ref 135–145)

## 2017-07-22 LAB — GLUCOSE, CAPILLARY: GLUCOSE-CAPILLARY: 171 mg/dL — AB (ref 65–99)

## 2017-07-22 SURGERY — RIGHT HEART CATH
Anesthesia: LOCAL

## 2017-07-22 MED ORDER — SODIUM CHLORIDE 0.9% FLUSH: 3.0000 mL | Freq: Two times a day (BID) | INTRAVENOUS | Status: DC

## 2017-07-22 MED ORDER — ACETAMINOPHEN 325 MG PO TABS: 650.0000 mg | ORAL_TABLET | ORAL | Status: DC | PRN

## 2017-07-22 MED ORDER — HEPARIN (PORCINE) IN NACL 1000-0.9 UT/500ML-% IV SOLN
INTRAVENOUS | Status: AC
  Filled 2017-07-22: qty 500

## 2017-07-22 MED ORDER — LIDOCAINE HCL (PF) 1 % IJ SOLN
INTRAMUSCULAR | Status: AC
  Filled 2017-07-22: qty 30

## 2017-07-22 MED ORDER — SODIUM CHLORIDE 0.9 % IV SOLN: 250.0000 mL | INTRAVENOUS | Status: DC | PRN

## 2017-07-22 MED ORDER — SODIUM CHLORIDE 0.9% FLUSH: 3.0000 mL | INTRAVENOUS | Status: DC | PRN

## 2017-07-22 MED ORDER — ASPIRIN 81 MG PO CHEW
CHEWABLE_TABLET | ORAL | Status: AC
  Administered 2017-07-22: 81 mg via ORAL
  Filled 2017-07-22: qty 1

## 2017-07-22 MED ORDER — ONDANSETRON HCL 4 MG/2ML IJ SOLN
4.0000 mg | Freq: Four times a day (QID) | INTRAMUSCULAR | Status: DC | PRN
Start: 2017-07-22 — End: 2017-07-22

## 2017-07-22 MED ORDER — ASPIRIN 81 MG PO CHEW
81.0000 mg | CHEWABLE_TABLET | ORAL | Status: AC
  Administered 2017-07-22: 81 mg via ORAL

## 2017-07-22 MED ORDER — HEPARIN (PORCINE) IN NACL 2-0.9 UNITS/ML
INTRAMUSCULAR | Status: DC | PRN
  Administered 2017-07-22: 500 mL

## 2017-07-22 MED ORDER — FENTANYL CITRATE (PF) 100 MCG/2ML IJ SOLN
INTRAMUSCULAR | Status: AC
  Filled 2017-07-22: qty 2

## 2017-07-22 MED ORDER — LIDOCAINE HCL (PF) 1 % IJ SOLN
INTRAMUSCULAR | Status: DC | PRN
  Administered 2017-07-22: 2 mL

## 2017-07-22 MED ORDER — MIDAZOLAM HCL 2 MG/2ML IJ SOLN
INTRAMUSCULAR | Status: AC
  Filled 2017-07-22: qty 2

## 2017-07-22 MED ORDER — SODIUM CHLORIDE 0.9 % IV SOLN
INTRAVENOUS | Status: DC
  Administered 2017-07-22: 10:00:00 via INTRAVENOUS

## 2017-07-22 SURGICAL SUPPLY — 5 items
CATH BALLN WEDGE 5F 110CM (CATHETERS) ×1 IMPLANT
PACK CARDIAC CATHETERIZATION (CUSTOM PROCEDURE TRAY) ×2 IMPLANT
SHEATH RAIN 4/5FR (SHEATH) ×1 IMPLANT
TRANSDUCER W/STOPCOCK (MISCELLANEOUS) ×2 IMPLANT
TUBING CIL FLEX 10 FLL-RA (TUBING) ×2 IMPLANT

## 2017-07-22 NOTE — Discharge Instructions (Signed)
Venogram °A venogram, or venography, is a procedure that uses an X-ray and dye (contrast) to examine how well the veins work and how blood flows through them. Contrast helps the veins show up on X-rays. A venogram may be done: °· To evaluate vein abnormalities. °· To identify clots within veins, such as deep vein thrombosis (DVT). °· To map out the veins that might be needed for another procedure. ° °Tell a health care provider about: °· Any allergies you have, especially to medicines, shellfish, iodine, and contrast. °· All medicines you are taking, including vitamins, herbs, eye drops, creams, and over-the-counter medicines. °· Any problems you or family members have had with anesthetic medicines. °· Any blood disorders you have. °· Any surgeries you have had and any complications that occurred. °· Any medical conditions you have. °· Whether you are pregnant, may be pregnant, or are breastfeeding. °· Any history of smoking or tobacco use. °What are the risks? °Generally, this is a safe procedure. However, problems may occur, including: °· Infection. °· Bleeding. °· Blood clots. °· Allergic reaction to medicines or contrast. °· Damage to other structures or organs. °· Kidney problems. °· X-ray exposure. Being exposed to too much radiation over a lifetime can increase the risk of cancer. The risk of this is small. ° °What happens before the procedure? °Medicines °Ask your health care provider about: °· Changing or stopping your normal medicines. This is especially important if you take diabetes medicines or blood thinners. °· Taking medicines such as aspirin and ibuprofen. These medicines can thin your blood. Do not take these medicines before your procedure if your doctor instructs you not to. ° °General instructions °· You may have blood tests to check how well your kidneys and liver are working and how well your blood can clot. °· Plan to have someone take you home from the hospital or clinic. °· Follow  instructions from your health care provider about eating and drinking. °What happens during the procedure? °· An IV will be inserted into one of your veins. °· You may be given a medicine to help you relax (sedative). °· You will lie down on an X-ray table. The table may be tilted in different directions during the procedure to help the contrast move throughout your body. Safety straps will keep you secure if the table is tilted. °· If veins in your arm or leg will be examined, a band may be wrapped around that arm or leg to keep the veins full of blood. This may cause your arm or leg to feel numb. °· The contrast will be injected into your IV tube. You may notice a hot, flushed feeling as it moves throughout your body. You may also notice a metallic taste in your mouth. Both of these sensations will go away after the test is complete. °· You may be asked to lie in different positions or place your legs or arms in different positions. °· At the end of the procedure, you may be given IV fluids to help wash (flush) the contrast out of your veins. °· The IV tube will be removed, and pressure will be applied to the IV site to prevent bleeding. A bandage (dressing) may be applied to the IV site. °What happens after the procedure? °· Your blood pressure, heart rate, breathing rate, and blood oxygen level will be monitored until the medicines you were given have worn off. °· You may be given something to eat and drink. °· You will be instructed   to drink a lot of fluids for the rest of the day and the next day. This helps to help flush the contrast out of your body. °· If you were given a sedative, do not drive for 24 hours or until your health care provider approves. °Summary °· A venogram, or venography, is a procedure that uses an X-ray and dye (contrast) to examine how well the veins work and how blood flows through them. Contrast is a dye that helps the veins show up on X-rays. °· An IV tube will be inserted into one  of your veins in order to inject the contrast. °· During the exam, you will lie on an X-ray table. The table may be tilted in different directions during the procedure to help the contrast move throughout your body. Safety straps will keep you secure. °· After the procedure, you will need to drink a lot of fluids to help wash (flush) the contrast out of your body. °This information is not intended to replace advice given to you by your health care provider. Make sure you discuss any questions you have with your health care provider. °Document Released: 02/04/2009 Document Revised: 01/11/2016 Document Reviewed: 01/11/2016 °Elsevier Interactive Patient Education © 2017 [employer] ° °

## 2017-07-22 NOTE — Interval H&P Note (Signed)
History and Physical Interval Note:  07/22/2017 11:11 AM  [name] [name]  has presented today for surgery, with the diagnosis of hf  The various methods of treatment have been discussed with the patient and family. After consideration of risks, benefits and other options for treatment, the patient has consented to  Procedure(s): RIGHT HEART CATH (N/A) as a surgical intervention .  The patient's history has been reviewed, patient examined, no change in status, stable for surgery.  I have reviewed the patient's chart and labs.  Questions were answered to the patient's satisfaction.     [name]

## 2017-07-23 ENCOUNTER — Encounter (HOSPITAL_COMMUNITY): Payer: Self-pay | Admitting: *Deleted

## 2017-07-23 MED FILL — Midazolam HCl Inj 2 MG/2ML (Base Equivalent): INTRAMUSCULAR | Qty: 2 | Status: AC

## 2017-07-23 MED FILL — Fentanyl Citrate Preservative Free (PF) Inj 100 MCG/2ML: INTRAMUSCULAR | Qty: 2 | Status: AC

## 2017-07-23 NOTE — Progress Notes (Signed)
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  Sample and order sent to [facility] via [place].

## 2017-07-28 ENCOUNTER — Other Ambulatory Visit: Payer: Self-pay | Admitting: Nurse Practitioner

## 2017-07-28 DIAGNOSIS — E0842 Diabetes mellitus due to underlying condition with diabetic polyneuropathy: Secondary | ICD-10-CM

## 2017-07-29 ENCOUNTER — Telehealth (HOSPITAL_COMMUNITY): Payer: Self-pay | Admitting: *Deleted

## 2017-07-29 NOTE — Telephone Encounter (Signed)
Molly (LVAD Coordinator) told me pt daughter called yesterday stating pt had swelling in feet and legs and that no one called her back. I called patient he said he was fine no extra swelling in feet or ankles and would just wait for any changes until his appt on Monday. Patient stated his daughter just gets nervous but he is ok. PT has f/u with LVAD clinic on Monday.

## 2017-08-02 ENCOUNTER — Ambulatory Visit (HOSPITAL_COMMUNITY)
Admission: RE | Admit: 2017-08-02 | Discharge: 2017-08-02 | Disposition: A | Discharge status: Home / Self Care | Payer: PPO | Source: Ambulatory Visit | Attending: Internal Medicine | Admitting: Internal Medicine

## 2017-08-02 ENCOUNTER — Ambulatory Visit (INDEPENDENT_AMBULATORY_CARE_PROVIDER_SITE_OTHER): Payer: PPO

## 2017-08-02 ENCOUNTER — Telehealth: Payer: Self-pay

## 2017-08-02 ENCOUNTER — Other Ambulatory Visit (HOSPITAL_COMMUNITY): Payer: Self-pay | Admitting: Unknown Physician Specialty

## 2017-08-02 VITALS — BP 123/68 | HR 75

## 2017-08-02 DIAGNOSIS — Z880 Allergy status to penicillin: Secondary | ICD-10-CM | POA: Insufficient documentation

## 2017-08-02 DIAGNOSIS — I2510 Atherosclerotic heart disease of native coronary artery without angina pectoris: Secondary | ICD-10-CM | POA: Insufficient documentation

## 2017-08-02 DIAGNOSIS — I130 Hypertensive heart and chronic kidney disease with heart failure and stage 1 through stage 4 chronic kidney disease, or unspecified chronic kidney disease: Secondary | ICD-10-CM | POA: Insufficient documentation

## 2017-08-02 DIAGNOSIS — I5022 Chronic systolic (congestive) heart failure: Secondary | ICD-10-CM | POA: Insufficient documentation

## 2017-08-02 DIAGNOSIS — E1121 Type 2 diabetes mellitus with diabetic nephropathy: Secondary | ICD-10-CM

## 2017-08-02 DIAGNOSIS — Z95810 Presence of automatic (implantable) cardiac defibrillator: Secondary | ICD-10-CM | POA: Diagnosis not present

## 2017-08-02 DIAGNOSIS — Z955 Presence of coronary angioplasty implant and graft: Secondary | ICD-10-CM | POA: Diagnosis not present

## 2017-08-02 DIAGNOSIS — E1122 Type 2 diabetes mellitus with diabetic chronic kidney disease: Secondary | ICD-10-CM | POA: Diagnosis not present

## 2017-08-02 DIAGNOSIS — Z7901 Long term (current) use of anticoagulants: Secondary | ICD-10-CM | POA: Insufficient documentation

## 2017-08-02 DIAGNOSIS — I255 Ischemic cardiomyopathy: Secondary | ICD-10-CM | POA: Diagnosis not present

## 2017-08-02 DIAGNOSIS — E7800 Pure hypercholesterolemia, unspecified: Secondary | ICD-10-CM | POA: Insufficient documentation

## 2017-08-02 DIAGNOSIS — Z79899 Other long term (current) drug therapy: Secondary | ICD-10-CM | POA: Diagnosis not present

## 2017-08-02 DIAGNOSIS — Z79890 Hormone replacement therapy (postmenopausal): Secondary | ICD-10-CM | POA: Insufficient documentation

## 2017-08-02 DIAGNOSIS — I480 Paroxysmal atrial fibrillation: Secondary | ICD-10-CM | POA: Diagnosis not present

## 2017-08-02 DIAGNOSIS — E1151 Type 2 diabetes mellitus with diabetic peripheral angiopathy without gangrene: Secondary | ICD-10-CM | POA: Diagnosis not present

## 2017-08-02 DIAGNOSIS — Z87891 Personal history of nicotine dependence: Secondary | ICD-10-CM | POA: Diagnosis not present

## 2017-08-02 DIAGNOSIS — N182 Chronic kidney disease, stage 2 (mild): Secondary | ICD-10-CM | POA: Insufficient documentation

## 2017-08-02 DIAGNOSIS — Z8249 Family history of ischemic heart disease and other diseases of the circulatory system: Secondary | ICD-10-CM | POA: Diagnosis not present

## 2017-08-02 DIAGNOSIS — Z794 Long term (current) use of insulin: Secondary | ICD-10-CM | POA: Insufficient documentation

## 2017-08-02 LAB — COMPREHENSIVE METABOLIC PANEL
ALT: 17 U/L (ref 17–63)
ANION GAP: 9 (ref 5–15)
AST: 23 U/L (ref 15–41)
Albumin: 3.1 g/dL — ABNORMAL LOW (ref 3.5–5.0)
Alkaline Phosphatase: 84 U/L (ref 38–126)
BILIRUBIN TOTAL: 1 mg/dL (ref 0.3–1.2)
BUN: 22 mg/dL — AB (ref 6–20)
CHLORIDE: 103 mmol/L (ref 101–111)
CO2: 27 mmol/L (ref 22–32)
Calcium: 9.5 mg/dL (ref 8.9–10.3)
Creatinine, Ser: 1.24 mg/dL (ref 0.61–1.24)
GFR calc Af Amer: >60 mL/min (ref >60)
GFR, EST NON AFRICAN AMERICAN: 58 mL/min — AB (ref >60)
Glucose, Bld: 186 mg/dL — ABNORMAL HIGH (ref 65–99)
POTASSIUM: 4.3 mmol/L (ref 3.5–5.1)
Sodium: 139 mmol/L (ref 135–145)
TOTAL PROTEIN: 7.7 g/dL (ref 6.5–8.1)

## 2017-08-02 LAB — CBC
HEMATOCRIT: 37.1 % — AB (ref 39.0–52.0)
Hemoglobin: 11.5 g/dL — ABNORMAL LOW (ref 13.0–17.0)
MCH: 27.3 pg (ref 26.0–34.0)
MCHC: 31 g/dL (ref 30.0–36.0)
MCV: 87.9 fL (ref 78.0–100.0)
PLATELETS: 197 10*3/uL (ref 150–400)
RBC: 4.22 MIL/uL (ref 4.22–5.81)
RDW: 15.4 % (ref 11.5–15.5)
WBC: 4.8 10*3/uL (ref 4.0–10.5)

## 2017-08-02 LAB — PREALBUMIN: PREALBUMIN: 17 mg/dL — AB (ref 18–38)

## 2017-08-02 LAB — HEMOGLOBIN A1C
HEMOGLOBIN A1C: 8.3 % — AB (ref 4.8–5.6)
Mean Plasma Glucose: 191.51 mg/dL

## 2017-08-02 LAB — LACTATE DEHYDROGENASE: LDH: 204 U/L — ABNORMAL HIGH (ref 98–192)

## 2017-08-02 NOTE — Progress Notes (Signed)
Advanced Heart Failure Clinic Note     Primary Cardiologist: Dr. Stanford [name] Primary HF: Dr. Haroldine [name]   HPI: [name] [name] is a 67 y.o. 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   Admitted 11/18/16 with ADHF. Echo 11/19/16 with LVEF 15-20%, Mod MR, Mod LAE, Mild RV dilation and moderately reduced function, Moderate TR, PA peak pressure 44 mm Hg. Device interrogation showed recent bout of AF but was back in NSR. Suspect AF contributing to decompensation. Work up planned for possible transplant, RV function probably will not support LVAD. Discharge weight was 225 pounds on 11/27/16.   In 12/18 he had R/L heart cath with 3v CAD and low filling pressures/low output.   Has begun transplant w/u at [facility].   Underwent RHC 07/22/17 with biventricular failure and low output  RA = 11 RV = 50/13 PA = 48/21 (30) PCW = 19 Fick cardiac output/index = 3.5/1.6 PVR = 3.1 WU Ao sat = 98% PA sat = 43%, 44% PAPi = 2.5 RA/PCWP = 0.58   Today he returns for HF follow up with his wife. Although he tries to remain active he feels he is getting weaker and gets very fatigued and SOB with ADLs particularly trying to take a shower. Denies CP, orthopnea or PND. Weight up nearly 10 pounds but doesn't feel like it is fluid. Still struggling with neuropathy Takes torsemide 50 everyday except Sunday. SBP remains 90-100 range for the most part. No dizziness    Studies:  CT ABD/Chest  IMPRESSION: 1. Mild cardiomegaly with what appears to be mild interstitial pulmonary edema, suggesting a background of mild congestive heart failure. 2. Aortic atherosclerosis, in addition to three-vessel coronary artery disease. Please note that although the presence of coronary artery calcium documents the presence of coronary artery disease, the severity of this disease and any potential  stenosis cannot be assessed on this non-gated CT examination. Assessment for potential risk factor modification, dietary therapy or pharmacologic therapy may be warranted, if clinically indicated. 3. There are mild calcifications of the aortic valve. Echocardiographic correlation for evaluation of potential valvular dysfunction may be warranted if clinically indicated.  CPX 12/18: Resting HR: 57 Peak HR: 67  (44% age predicted max HR) BP rest: 90/64 BP peak: 78/58 Peak VO2: 9.3 (41% predicted peak VO2) VE/VCO2 slope: 53 OUES: 1.09 Peak RER: 0.90  R/L Cath 12/18: 1) LAD diffuse 60% stenosis 2) LCX multiple 40% lesions 3) RCA chronically occluded proximally with L to R collaterals  Ao = 99/56 (73) LV = 101/5 RA = 1 RV = 29/1 PA = 23/11 (16) PCW = 6 Fick cardiac output/index = 3.8/1.7 Thermo CO/CI = 4.1/1.9 PVR = 2.6 Ao sat = 99% PA sat = 60%, 61% PaPI = 12 RA/PCWP = 0.16  Review of systems complete and found to be negative unless listed in HPI.    Past Medical History:  Diagnosis Date  . AICD (automatic cardioverter/defibrillator) present 02/05/2014   Upgrade to Medtronic biventricular ICD, serial number  [id number]   . Atrial flutter ([place]) 04/2012   s/p TEE-EPS+RFCA 04/2012  . CAD (coronary artery disease) 2376,[phone] X 2    RCA-T, 70% PL (off CFX), 99% Prox LAD/90% Dist LAD, S/P TAXUS stent x 2  . CHF (congestive heart failure) ([place])   . Chronic anticoagulation   . Chronic systolic heart failure (  [place])   . CKD (chronic kidney disease)   . Diabetic retinopathy ([place])   . DM type 2 (diabetes mellitus, type 2) ([facility])    insulin dependent  . HTN (hypertension)   . Hypercholesteremia    ablation  . ICD (implantable cardiac defibrillator) in place   . Ischemic cardiomyopathy March 2015   20-25% 2D   . Nephrolithiasis   . Ventricular tachycardia ([place])     Current Outpatient Medications  Medication Sig Dispense Refill  . acetaminophen (TYLENOL) 500 MG tablet Take  1,000 mg by mouth every 6 (six) hours as needed for moderate pain or headache.    Marland Kitchen apixaban (ELIQUIS) 5 MG TABS tablet Take 1 tablet (5 mg total) by mouth 2 (two) times daily. 60 tablet 6  . atorvastatin (LIPITOR) 80 MG tablet TAKE ONE TABLET BY MOUTH ONCE DAILY (Patient taking differently: TAKE 80 MG BY MOUTH ONCE DAILY) 90 tablet 3  . digoxin (LANOXIN) 0.125 MG tablet Take 1 tablet (0.125 mg total) by mouth daily. (Patient taking differently: Take 0.125 mg by mouth at bedtime. ) 90 tablet 3  . Dulaglutide (TRULICITY) 1.5 OE/4.2PN SOPN Inject contents of pen weekly (Patient taking differently: Inject 1.5 mg into the skin every Friday. ) 4 pen 2  . gabapentin (NEURONTIN) 300 MG capsule Take 1 capsule (300 mg total) by mouth 3 (three) times daily. 270 capsule 3  . insulin NPH-regular Human (NOVOLIN 70/30 RELION) (70-30) 100 UNIT/ML injection Inject 30 Units into the skin 2 (two) times daily with a meal. 10 mL 12  . losartan (COZAAR) 25 MG tablet Take 12.5 mg by mouth at bedtime.    . magnesium oxide (MAG-OX) 400 MG tablet Take 1 tablet (400 mg total) by mouth daily. 90 tablet 3  . metFORMIN (GLUCOPHAGE-XR) 750 MG 24 hr tablet TAKE 2 TABLETS BY MOUTH ONCE DAILY WITH BREAKFAST (Patient taking differently: Take 1,500 mg by mouth daily with breakfast. ) 180 tablet 0  . Multiple Vitamins-Minerals (MULTIVITAMIN PO) Take 1 capsule by mouth daily.     . sotalol (BETAPACE) 120 MG tablet TAKE ONE TABLET BY MOUTH TWICE DAILY (Patient taking differently: TAKE 120MG  BY MOUTH TWICE DAILY) 180 tablet 3  . torsemide (DEMADEX) 100 MG tablet Take 50 mg by mouth See admin instructions. Take 50 mg by mouth daily except DO NOT TAKE on Sunday     No current facility-administered medications for this encounter.     Allergies  Allergen Reactions  . Penicillins Hives, Itching and Other (See Comments)    Has patient had a PCN reaction causing immediate rash, facial/tongue/throat swelling, SOB or lightheadedness with  hypotension: Yes Has patient had a PCN reaction causing severe rash involving mucus membranes or skin necrosis: No Has patient had a PCN reaction that required hospitalization: No Has patient had a PCN reaction occurring within the last 10 years: No If all of the above answers are "NO", then may proceed with Cephalosporin use.       Social History   Socioeconomic History  . Marital status: Married    Spouse name: Not on file  . Number of children: 4  . Years of education: 43  . Highest education level: Not on file  Occupational History  . Occupation: Psychiatrist - currently unemployed  Social Needs  . Financial resource strain: Not on file  . Food insecurity:    Worry: Not on file    Inability: Not on file  . Transportation needs:    Medical: Not on  file    Non-medical: Not on file  Tobacco Use  . Smoking status: Former Smoker    Packs/day: 1.00    Years: 29.00    Pack years: 29.00    Types: Cigarettes    Last attempt to quit: 03/02/2000    Years since quitting: 17.4  . Smokeless tobacco: Never Used  Substance and Sexual Activity  . Alcohol use: No    Alcohol/week: 0.0 oz  . Drug use: No  . Sexual activity: Not on file  Lifestyle  . Physical activity:    Days per week: Not on file    Minutes per session: Not on file  . Stress: Not on file  Relationships  . Social connections:    Talks on phone: Not on file    Gets together: Not on file    Attends religious service: Not on file    Active member of club or organization: Not on file    Attends meetings of clubs or organizations: Not on file    Relationship status: Not on file  . Intimate partner violence:    Fear of current or ex partner: Not on file    Emotionally abused: Not on file    Physically abused: Not on file    Forced sexual activity: Not on file  Other Topics Concern  . Not on file  Social History Narrative   Pt lives with wife in a one story home - married for 40 years. He hunts regularly and is  active, walking > 1 mile at times without difficulty.  Has 4 children.     Retired Horticulturist, commercial.  Education: some college.   Family History  Problem Relation Age of Onset  . Heart failure Father        Deceased  . Alzheimer's disease Mother        Living  . Heart attack Mother   . CAD Brother   . Other Brother        blood cancer  . CAD Brother   . Prostate cancer Brother   . CAD Brother   . CAD Brother   . Healthy Son   . Healthy Daughter   . Diabetes Brother   . Stomach cancer Neg Hx   . Colon cancer Neg Hx   \  Vital Signs:  Automatc BP:  123/68 (85) HR: 75 SPO2: 98  %  Weight: 241.6 lb w/o eqt Last weight: 223 lb Home weights: 235 lbs   Vitals:   08/02/17 1111  BP: 123/68  Pulse: 75   Wt Readings from Last 3 Encounters:  07/22/17 229 lb (103.9 kg)  07/21/17 219 lb 11.2 oz (99.7 kg)  06/24/17 223 lb (101.2 kg)    PHYSICAL EXAM: General:  Well appearing. No resp difficulty HEENT: normal Neck: supple. JVP 6  Carotids 2+ bilat; no bruits. No lymphadenopathy or thryomegaly appreciated. Cor: PMI laterally displaced. Regular rate & rhythm. No rubs, gallops or murmurs. Lungs: clear Abdomen: obese soft, nontender, nondistended. No hepatosplenomegaly. No bruits or masses. Good bowel sounds. Extremities: no cyanosis, clubbing, rash, edema. Boot on RLE Neuro: alert & orientedx3, cranial nerves grossly intact. moves all 4 extremities w/o difficulty. Affect pleasant   ASSESSMENT & PLAN: 1. Chronic systolic CHF: - Echo 1/75/10 EF 15-20% biventricular Failure with moderate RV dysfunction (likely combined ICM/NICM). He has Medtronic BiV ICD in place.  - Cath 12/18 with stable 1v CAD. Low filling pressures with CI 1.7  - CPX testing 12/18 with submax test but  severe HF limitation with exertional hypotension. Peak VO2: 9.3 (41% predicted peak VO2). VE/VCO2 slope: 53 - He is much worse today NYHA IIIb. Recent RHC with low output.  - Will need admission for milrinone  initiation - Given his size and blood type O doubt he will be able to wait for transplant and may need bridge VAD. I will d/w Dr. Posey Pronto at [facility]  - Volume status mildly elevated can double torsemide for 1-2 days . - Continue losartan 12.5. BP too low to titrate or switch to [employer]xair - Off b-blocker due to low output and low BP and bradycardia - Continue digoxin 0.125 - On sotalol for VT. Continue for now.   - Continue Paramedicine support - Continue f.u with [facility]  2. [name]  - recent cath with stable CAD as above - No s/s ischemia - Off ASA with apixaban. Continue statin   3. [name] - Recent A1c was 11.7 now 7.6. Congratulated him on improvement -Followed by Endocrinology.   4. CKD II-III - Creatinine stable at 1.2 today  6. [name] - s/p ICD. Quiescent - Continue sotalol.   7. PAF s/p ablation 04/2012 - Remains in NSR - Continue Eliquis. No evidence of bleeding. Hgb 11.5 today  Total time spent 35 minutes. Over half that time spent discussing above.    Glori Bickers, MD 08/02/17 08/02/17

## 2017-08-02 NOTE — Progress Notes (Signed)
EPIC Encounter for ICM Monitoring  Patient Name: [name] [name] is a 67 y.o. male Date: 08/02/2017 Primary Care Physican: [name], NP Primary Cardiologist:[name] Electrophysiologist: [name] Dry Weight: Previous weight 226 lbs (Baseline216-220 lbs) Bi-V Pacing:86.9% (was 97.4%07/01/2017 transmission)   Clinical Status (01-Jul-2017 to 02-Aug-2017) Treated VT/VF 0 episodes AT/AF 1 episode Time in AT/AF 13.6 hr/day (56.7%)  18 days in AT/AF Since Last Session  Observations (3) (01-Jul-2017 to 02-Aug-2017)  AT/AF >= 6 hr for 19 days.  V. Pacing less than 90%.  Patient Activity less than 1 hr/day for 2 weeks.      Attempted call to patient and unable to reach.  Left detailed message, per DPR, regarding transmission.  Transmission reviewed.    Thoracic impedance abnormal suggesting fluid accumulation since 07/13/2017.  Prescribed dosage: Torsemide 100 mgtake 50 mg by mouth every day except Sundays.  Labs: 08/02/2017 Creatinine 1.24, BUN 22, Potassium 4.3, Sodium 139, EGFR 58->60 07/22/2017 Creatinine 1.33, BUN 29, Potassium 4.5, Sodium 141, EGFR 54->60 06/24/2017 Creatinine 1.23, BUN 26, Potassium 4.0, Sodium 136, EGFR 59->60 05/24/2017 Creatinine 0.98, BUN 20, Potassium 3.8, Sodium 136, EGFR >60  04/21/2017 Creatinine 1.09, BUN 25, Potassium 3.9, Sodium 139, EGFR >60  04/09/2017 Creatinine 1.52, BUN 30, Potassium 3.8, Sodium 136, EGFR 46-53  03/29/2017 Creatinine 1.01, BUN 21, Potassium 4.1, Sodium 137  03/19/2017 Creatinine1.34, BUN30, Potassium5.2, [id number], EGFR53->60 02/17/2017 Creatinine1.44, BUN22, Potassium4.5, [id number], [id number]  02/05/2017 Creatinine1.37, BUN29, Potassium4.9, Sodium134, EGFR52->60  01/27/2017 Creatinine1.47, BUN44, Potassium4.7, Sodium132, [id number]  01/15/2017 Creatinine1.13, BUN25, Potassium4.7, Sodium131, EGFR>60 A complete set of results can be found in results review.  Recommendations:   Left voice mail with ICM number and encouraged to call if experiencing any fluid symptoms. Advised will call back if recommendations are made.  Follow-up plan: ICM clinic phone appointment on 08/09/2017.  Office appointment scheduled 08/25/2017 with Dr. [name].  Copy of ICM check sent to Dr. [name] and Dr. [name].   3 month ICM trend: 08/02/2017    1 Year ICM trend:       [name], RN 08/02/2017 12:24 PM

## 2017-08-02 NOTE — Progress Notes (Signed)
Patient presents for RHC  follow up in [facility] today. Reports no problems with VAD equipment or concerns with drive line.  Vital Signs:  Automatc BP:  123/68 (85) HR: 75  SPO2: 98  %  Weight: 241.6 lb w/o eqt Last weight: 223 lb Home weights: 235 lbs  Device:Medtronic Therapies: on Last check:   Plan: We will admit the patient next week for picc line w/ initiation of Milrinone.   Tanda Rockers RN [place] Coordinator   Office: [phone] 24/7 Emergency VAD Pager: [phone]

## 2017-08-02 NOTE — Telephone Encounter (Signed)
LMOVM reminding pt to send remote transmission.   

## 2017-08-03 ENCOUNTER — Telehealth (HOSPITAL_COMMUNITY): Payer: Self-pay

## 2017-08-04 NOTE — Telephone Encounter (Signed)
I called [name] [name] after seeing a missed call from him yesterday. He stated that he currently afford Trulicity after the dose increase. He said it is well over $100/mo now and wanted to see if I could help with this somehow. I explained that the HF clinic is only able to assist with HF related medications but he should contact his endocrinologist and see what the could help him with. I will also speak with Bonnita Nasuti about this and see if she has any direction to offer. Additionally he stated he was almost out of metformin and did not have any refills. I gave him he same advice on this as the Trulicity, he should call the endocrinologist. He was understanding and agreeable.

## 2017-08-05 ENCOUNTER — Ambulatory Visit: Payer: PPO | Admitting: Endocrinology

## 2017-08-05 DIAGNOSIS — Z0289 Encounter for other administrative examinations: Secondary | ICD-10-CM

## 2017-08-05 NOTE — Progress Notes (Deleted)
Patient ID: [name] [name], male   DOB: 1951-01-02, 67 y.o.   MRN: [id number]          Reason for Appointment:  for Type 2 Diabetes   History of Present Illness:          Date of diagnosis of type 2 diabetes mellitus:  2005      Background history:   He believes he has been on insulin since the time of diagnosis Records of his previous treatment are not available His blood sugar control has been inconsistent and only a couple of times his A1c has been in the 7-8 range  Recent history:   INSULIN regimen is:   Novolin 70/30, taken at 7 AM and 7 PM     Non-insulin hypoglycemic drugs the patient is taking are: Trulicity 7.26 mg weekly and metformin ER 750 mg, 2 tablets daily  He has been referred here by his cardiologist for better control  Current management, blood sugar patterns and problems identified:  He takes his insulin at the same time daily without regard to his meals; this would be to 1-1/2 hours before breakfast and sometimes an hour after dinner  He only checks his blood sugar fasting in the morning and did not bring his monitor  His fasting readings are somewhat variable but relatively higher than last month  He had been given Trulicity last year when his A1c had gone up significantly and subsequently his blood sugars were better but he only took this for 3-4 months  He is back on Trulicity since January when his A1c went up to 11.7 and blood sugars were higher   Currently not able to do any exercise because of ankle fracture about 3 months ago  His diet appears to be usually low in fat and carbohydrate and he is not doing a lot of snacking or drinking sweet drinks        Side effects from medications have been: Occasional diarrhea from metformin  Compliance with the medical regimen: Fairly good Hypoglycemia:   Never  Glucose monitoring:  done 1 times a day         Glucometer: One Touch.      Blood Glucose readings by review of home record    PREMEAL  Breakfast Lunch Dinner Bedtime  Overall   Glucose range: 147-202      Median:        Self-care: The diet that the patient has been following is: tries to limit bread and sweets.      Typical meal intake: Breakfast is   usually boiled eggs and applesauce.  Lunch may be vegetables and meat and sometimes salad and hamburger meat.  Evening meal is usually meat and vegetables   Usually not having snacks            Dietician visit, most recent: None               Exercise:  Recently not able to because of right ankle fracture  Weight history:  Wt Readings from Last 3 Encounters:  07/22/17 229 lb (103.9 kg)  07/21/17 219 lb 11.2 oz (99.7 kg)  06/24/17 223 lb (101.2 kg)    Glycemic control:   Lab Results  Component Value Date   HGBA1C 8.3 (H) 08/02/2017   HGBA1C 7.6 (H) 05/24/2017   HGBA1C 11.7 (H) 03/29/2017   Lab Results  Component Value Date   MICROALBUR 1.0 11/25/2015   LDLCALC 74 04/27/2016   CREATININE 1.24 08/02/2017  Lab Results  Component Value Date   MICRALBCREAT 2.3 11/25/2015    No results found for: FRUCTOSAMINE    Allergies as of 08/05/2017      Reactions   Penicillins Hives, Itching, Other (See Comments)   Has patient had a PCN reaction causing immediate rash, facial/tongue/throat swelling, SOB or lightheadedness with hypotension: Yes Has patient had a PCN reaction causing severe rash involving mucus membranes or skin necrosis: No Has patient had a PCN reaction that required hospitalization: No Has patient had a PCN reaction occurring within the last 10 years: No If all of the above answers are "NO", then may proceed with Cephalosporin use.      Medication List        Accurate as of 08/05/17  9:02 AM. Always use your most recent med list.          acetaminophen 500 MG tablet Commonly known as:  TYLENOL Take 1,000 mg by mouth every 6 (six) hours as needed for moderate pain or headache.   apixaban 5 MG Tabs tablet Commonly known as:  ELIQUIS Take  1 tablet (5 mg total) by mouth 2 (two) times daily.   atorvastatin 80 MG tablet Commonly known as:  LIPITOR TAKE ONE TABLET BY MOUTH ONCE DAILY   digoxin 0.125 MG tablet Commonly known as:  LANOXIN Take 1 tablet (0.125 mg total) by mouth daily.   Dulaglutide 1.5 MG/0.5ML Sopn Commonly known as:  TRULICITY Inject contents of pen weekly   gabapentin 300 MG capsule Commonly known as:  NEURONTIN Take 1 capsule (300 mg total) by mouth 3 (three) times daily.   insulin NPH-regular Human (70-30) 100 UNIT/ML injection Commonly known as:  NOVOLIN 70/30 RELION Inject 30 Units into the skin 2 (two) times daily with a meal.   losartan 25 MG tablet Commonly known as:  COZAAR Take 12.5 mg by mouth at bedtime.   magnesium oxide 400 MG tablet Commonly known as:  MAG-OX Take 1 tablet (400 mg total) by mouth daily.   metFORMIN 750 MG 24 hr tablet Commonly known as:  GLUCOPHAGE-XR TAKE 2 TABLETS BY MOUTH ONCE DAILY WITH BREAKFAST   MULTIVITAMIN PO Take 1 capsule by mouth daily.   sotalol 120 MG tablet Commonly known as:  BETAPACE TAKE ONE TABLET BY MOUTH TWICE DAILY   torsemide 100 MG tablet Commonly known as:  DEMADEX Take 50 mg by mouth See admin instructions. Take 50 mg by mouth daily except DO NOT TAKE on Sunday       Allergies:  Allergies  Allergen Reactions  . Penicillins Hives, Itching and Other (See Comments)    Has patient had a PCN reaction causing immediate rash, facial/tongue/throat swelling, SOB or lightheadedness with hypotension: Yes Has patient had a PCN reaction causing severe rash involving mucus membranes or skin necrosis: No Has patient had a PCN reaction that required hospitalization: No Has patient had a PCN reaction occurring within the last 10 years: No If all of the above answers are "NO", then may proceed with Cephalosporin use.     Past Medical History:  Diagnosis Date  . AICD (automatic cardioverter/defibrillator) present 02/05/2014   Upgrade to  Medtronic biventricular ICD, serial number  [id number]   . Atrial flutter ([place]) 04/2012   s/p TEE-EPS+RFCA 04/2012  . CAD (coronary artery disease) 9379,0240 X 2    RCA-T, 70% PL (off CFX), 99% Prox LAD/90% Dist LAD, S/P TAXUS stent x 2  . CHF (congestive heart failure) ([place])   . Chronic anticoagulation   .  Chronic systolic heart failure ([place])   . CKD (chronic kidney disease)   . Diabetic retinopathy ([place])   . DM type 2 (diabetes mellitus, type 2) ([facility])    insulin dependent  . HTN (hypertension)   . Hypercholesteremia    ablation  . ICD (implantable cardiac defibrillator) in place   . Ischemic cardiomyopathy March 2015   20-25% 2D   . Nephrolithiasis   . Ventricular tachycardia [facility])     Past Surgical History:  Procedure Laterality Date  . ATRIAL FLUTTER ABLATION N/A 05/19/2012   Procedure: ATRIAL FLUTTER ABLATION;  Surgeon: Thompson Grayer, MD;  Location: [facility] CATH LAB;  Service: Cardiovascular;  Laterality: N/A;  . BI-VENTRICULAR IMPLANTABLE CARDIOVERTER DEFIBRILLATOR UPGRADE N/A 02/05/2014   Procedure: BI-VENTRICULAR IMPLANTABLE CARDIOVERTER DEFIBRILLATOR UPGRADE;  Surgeon: Evans Lance, MD;  Location: [facility] CATH LAB;  Service: Cardiovascular;  Laterality: N/A;  . BIV ICD GENERTAOR CHANGE OUT  02/05/2014   Upgrade to Medtronic biventricular ICD, serial number  [id number] by Dr. [name]  . CARDIAC DEFIBRILLATOR PLACEMENT  2007    Medtronic Maximo VR, serial number [id number]  . PERCUTANEOUS CORONARY STENT INTERVENTION (PCI-S)  January 2002   PTCA/Stent Distal RCA  . PERCUTANEOUS CORONARY STENT INTERVENTION (PCI-S)  June 2002   PTCA/Stent x 3 RCA, thrombolysis - failed  . PERCUTANEOUS CORONARY STENT INTERVENTION (PCI-S)  July 2006   TAXUS stents to prox and distal LAD  . RIGHT HEART CATH N/A 07/22/2017   Procedure: RIGHT HEART CATH;  Surgeon: Jolaine Artist, MD;  Location: [place] CV LAB;  Service: Cardiovascular;  Laterality: N/A;  . RIGHT/LEFT HEART CATH AND CORONARY  ANGIOGRAPHY N/A 02/18/2017   Procedure: RIGHT/LEFT HEART CATH AND CORONARY ANGIOGRAPHY;  Surgeon: Jolaine Artist, MD;  Location: [place] CV LAB;  Service: Cardiovascular;  Laterality: N/A;    Family History  Problem Relation Age of Onset  . Heart failure Father        Deceased  . Alzheimer's disease Mother        Living  . Heart attack Mother   . CAD Brother   . Other Brother        blood cancer  . CAD Brother   . Prostate cancer Brother   . CAD Brother   . CAD Brother   . Healthy Son   . Healthy Daughter   . Diabetes Brother   . Stomach cancer Neg Hx   . Colon cancer Neg Hx     Social History:  reports that he quit smoking about 17 years ago. His smoking use included cigarettes. He has a 29.00 pack-year smoking history. He has never used smokeless tobacco. He reports that he does not drink alcohol or use drugs.   Review of Systems  Constitutional: Negative for weight gain and reduced appetite.  HENT: Negative for headaches.   Eyes: Negative for blurred vision.  Respiratory: Negative for shortness of breath.   Cardiovascular: Positive for leg swelling. Negative for chest pain.       Occasionally right leg may swell  Gastrointestinal: Negative for abdominal pain.  Endocrine: Negative for fatigue and polydipsia.  Genitourinary: Positive for nocturia.       Once  Musculoskeletal: Negative for joint pain.  Skin: Negative for rash.  Neurological: Positive for tingling. Negative for balance difficulty.       He has had sharp pains in his feet and lower legs treated with gabapentin by a neurologist  Psychiatric/Behavioral: Negative for insomnia.     Lipid history:  He is on high intensity Lipitor, previous history of CAD    Lab Results  Component Value Date   CHOL 116 04/27/2016   HDL 31 (L) 04/27/2016   LDLCALC 74 04/27/2016   LDLDIRECT 197.0 07/10/2011   TRIG 57 04/27/2016   CHOLHDL 3.7 04/27/2016           Hypertension:Losartan  BP Readings from Last  3 Encounters:  08/02/17 123/68  07/22/17 98/74  07/21/17 102/66    Most recent eye exam was in 3/18, Dr Posey Pronto is his retinal surgeon and has had steroids injections and laser treatment before   Most recent foot exam: 4/19    LABS:  Hospital Outpatient Visit on 08/02/2017  Component Date Value Ref Range Status  . Prealbumin 08/02/2017 17.0* 18 - 38 mg/dL Final   Performed at [facility], [place] [address], [place], [place] [address]  . Hgb A1c MFr Bld 08/02/2017 8.3* 4.8 - 5.6 % Final   Comment: (NOTE) Pre diabetes:          5.7%-6.4% Diabetes:              >6.4% Glycemic control for   <7.0% adults with diabetes   . Mean Plasma Glucose 08/02/2017 191.51  mg/dL Final   Performed at [facility] [address] [place], [place] [address]  . LDH 08/02/2017 204* 98 - 192 U/L Final   Performed at [facility], [place] [address], [place], [place] [address]  . WBC 08/02/2017 4.8  4.0 - 10.5 K/uL Final  . RBC 08/02/2017 4.22  4.22 - 5.81 MIL/uL Final  . Hemoglobin 08/02/2017 11.5* 13.0 - 17.0 g/dL Final  . HCT 08/02/2017 37.1* 39.0 - 52.0 % Final  . MCV 08/02/2017 87.9  78.0 - 100.0 fL Final  . MCH 08/02/2017 27.3  26.0 - 34.0 pg Final  . MCHC 08/02/2017 31.0  30.0 - 36.0 g/dL Final  . RDW 08/02/2017 15.4  11.5 - 15.5 % Final  . Platelets 08/02/2017 197  150 - 400 K/uL Final   Performed at [facility], [place] [address] [place], [place] [address]  . Sodium 08/02/2017 139  135 - 145 mmol/L Final  . Potassium 08/02/2017 4.3  3.5 - 5.1 mmol/L Final  . Chloride 08/02/2017 103  101 - 111 mmol/L Final  . CO2 08/02/2017 27  22 - 32 mmol/L Final  . Glucose, Bld 08/02/2017 186* 65 - 99 mg/dL Final  . BUN 08/02/2017 22* 6 - 20 mg/dL Final  . Creatinine, Ser 08/02/2017 1.24  0.61 - 1.24 mg/dL Final  . Calcium 08/02/2017 9.5  8.9 - 10.3 mg/dL Final  . Total Protein 08/02/2017 7.7  6.5 - 8.1 g/dL Final  . Albumin 08/02/2017 3.1* 3.5 - 5.0 g/dL Final  . AST 08/02/2017 23   15 - 41 U/L Final  . ALT 08/02/2017 17  17 - 63 U/L Final  . Alkaline Phosphatase 08/02/2017 84  38 - 126 U/L Final  . Total Bilirubin 08/02/2017 1.0  0.3 - 1.2 mg/dL Final  . GFR calc non Af Amer 08/02/2017 58* >60 mL/min Final  . GFR calc Af Amer 08/02/2017 >60  >60 mL/min Final   Comment: (NOTE) The eGFR has been calculated using the CKD EPI equation. This calculation has not been validated in all clinical situations. eGFR's persistently <60 mL/min signify possible Chronic Kidney Disease.   . Anion gap 08/02/2017 9  5 - 15 Final   Performed at [facility], [place] [address] [place], [place] [address]    Physical Examination:  There were no vitals taken for this visit.  GENERAL:         Patient has generalized obesity.    HEENT:         Eye exam shows normal external appearance.  Fundus exam shows no retinopathy.  Oral exam shows normal mucosa .  NECK:   There is no lymphadenopathy Thyroid is not enlarged and no nodules felt.  Carotids are normal to palpation and no bruit heard  LUNGS:         Chest is symmetrical. Lungs are clear to auscultation.Marland Kitchen   HEART:         Heart sounds:  S1 and S2 are normal. No murmur or click heard., no S3 or S4.   ABDOMEN:   There is no distention present. Liver and spleen are not palpable. No other mass or tenderness present.    NEUROLOGICAL:   Ankle jerks are absent bilaterally.    Diabetic Foot Exam - Simple   No data filed             Vibration sense is  reduced in distal first toes. MUSCULOSKELETAL:  There is no swelling or deformity of the peripheral joints.     EXTREMITIES:     There is no edema.  SKIN:       No rash or lesions of concern.        ASSESSMENT:  Diabetes type 2, uncontrolled     Last A1c 7.6  He has variably controlled diabetes with taking insulin and more recently Trulicity By history he appears to have benefited from adding Trulicity to his insulin regimen but is still taking only 0.75 mg Although he is  not able to do much physical activity over the last 3 months he has kept his weight down His diet is relatively controlled with reduced carbohydrate and mostly low fat intake and some protein at each meals  Currently taking insulin somewhat arbitrarily without regard to the mealtimes and may be sometimes postprandial or even an hour before eating He may still have significant postprandial hyperglycemia Today discussed the type of insulin he is taking, need to control postprandial and fasting readings both as well as the appropriate timing for his premixed insulin Also explained that this may or may not be the ideal insulin for him and difficult to determine this unless he has more frequent glucose monitoring after meals  Complications of diabetes: Peripheral neuropathy, unknown status of nephropathy, reportedly has had retinopathy also  CARDIOMYOPATHY: Appears to be adequately controlled at this time  Although he is a candidate for using Jardiance for both benefit with his CHF and diabetes this may lower his blood pressure further and would be difficult to balance out the Jardiance and his diuretic regimen  Hyperlipidemia: No recent labs available   PLAN:    He was explained the need to start taking his insulin consistently about 30 minutes before breakfast and suppertime  He will increase his Trulicity up to 1.5 mg weekly starting this Friday  Start alternating fasting and postprandial readings instead of just in the morning to check 2 hours after eating  Discussed blood sugar targets both fasting and after meals  He will try to cut back on higher fat foods  Also discussed potential for hypoglycemia with more effective treatment and given him brochure on hypoglycemic symptoms management  Will need to follow-up in about 3 weeks to reassess  his blood sugar patterns and make adjustments as needed  Discussed that if his blood sugars are quite variable and has inconsistent postprandial  readings may need to use 2 separate insulins instead of premixed insulin  He will start walking back again when he is able to  There are no Patient Instructions on file for this visit. Counseling time on subjects discussed in assessment and plan sections is over 50% of today's 60 minute visit   Consultation note has been sent to the referring physician  Elayne Snare 08/05/2017, 9:02 AM   Note: This office note was prepared with Dragon voice recognition system technology. Any transcriptional errors that result from this process are unintentional.

## 2017-08-06 ENCOUNTER — Ambulatory Visit: Payer: PPO | Admitting: Neurology

## 2017-08-06 ENCOUNTER — Encounter

## 2017-08-10 ENCOUNTER — Telehealth (HOSPITAL_COMMUNITY): Payer: Self-pay

## 2017-08-10 ENCOUNTER — Telehealth: Payer: Self-pay

## 2017-08-10 NOTE — H&P (Addendum)
Advanced Heart Failure Team History and Physical Note   PCP:  [name], NP  PCP-Cardiology: No primary care provider on file.     Reason for Admission: Acute on chronic systolic CHF  HPI:    [name] [name] is a 67 y.o. 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   Currently being worked up for advanced therapies. He was evaluated [facility] /Dr Posey Pronto on 5/9 for possible heart transplant. Dr [name] discussed with Dr Posey Pronto. Given size and blood type there was concern he would not make it to transplant.   Underwent RHC 07/22/17 with biventricular failure and low output noted.  RA = 11 RV = 50/13 PA = 48/21 (30) PCW = 19 Fick cardiac output/index = 3.5/1.6 PVR = 3.1 WU Ao sat = 98% PA sat = 43%, 44% PAPi = 2.5 RA/PCWP = 0.58  Seen in HF clinic 08/02/2017 for follow up. C/O fatigue and SOB with ADLs, particularly bathing. He was up 10 lbs taking torsemide 50 mg daily (except Sunday). BP reportedly soft in 90-100s but denied lightheadedness or dizziness. Plan was scheduled admit for initiation of milrinone today.   During office visit last week he was instructed to double torsemide for few days. Weight at that time was 241 pounds. Weight has continued to go up to the 240s. Increased leg edema and dyspnea with exertion. + orthopnea.   Currently SOB with minimal activity. 3+ edema. +orhopne. No CP     Review of Systems: [y] = yes, [ ]  = no   General: Weight gain [y]; Weight loss [ ] ; Anorexia [ ] ; Fatigue [ y]; Fever [ ] ; Chills [ ] ; Weakness [y]  Cardiac: Chest pain/pressure [ ] ; Resting SOB [ ] ; Exertional SOB [y]; Orthopnea [ ] ; Pedal Edema [y]; Palpitations [ ] ; Syncope [ ] ; Presyncope [ ] ; Paroxysmal nocturnal dyspnea[ ]   Pulmonary: Cough [ ] ; Wheezing[ ] ; Hemoptysis[ ] ; Sputum [ ] ; Snoring [ ]   GI: Vomiting[ ] ; Dysphagia[ ] ; Melena[ ] ;  Hematochezia [ ] ; Heartburn[ ] ; Abdominal pain [ ] ; Constipation [ ] ; Diarrhea [ ] ; BRBPR [ ]   GU: Hematuria[ ] ; Dysuria [ ] ; Nocturia[ ]   Vascular: Pain in legs with walking [ ] ; Pain in feet with lying flat [ ] ; Non-healing sores [ ] ; Stroke [ ] ; TIA [ ] ; Slurred speech [ ] ;  Neuro: Headaches[ ] ; Vertigo[ ] ; Seizures[ ] ; Paresthesias[ ] ;Blurred vision [ ] ; Diplopia [ ] ; Vision changes [ ]   Ortho/Skin: Arthritis [y]; Joint pain [y]; Muscle pain [ ] ; Joint swelling [ ] ; Back Pain [ ] ; Rash [ ]   Psych: Depression[ ] ; Anxiety[ ]   Heme: Bleeding problems [ ] ; Clotting disorders [ ] ; Anemia [ ]   Endocrine: Diabetes Blue.Reese ]; Thyroid dysfunction[ ]    Home Medications Prior to Admission medications   Medication Sig Start Date End Date Taking? Authorizing Provider  acetaminophen (TYLENOL) 500 MG tablet Take 1,000 mg by mouth every 6 (six) hours as needed for moderate pain or headache.    [provider]  apixaban (ELIQUIS) 5 MG TABS tablet Take 1 tablet (5 mg total) by mouth 2 (two) times daily. 04/09/15   Lelon Perla, MD  atorvastatin (LIPITOR) 80 MG tablet TAKE ONE TABLET BY MOUTH ONCE DAILY Patient taking differently: TAKE 80 MG BY MOUTH ONCE DAILY 07/31/16   Lelon Perla,  MD  digoxin (LANOXIN) 0.125 MG tablet Take 1 tablet (0.125 mg total) by mouth daily. Patient taking differently: Take 0.125 mg by mouth at bedtime.  03/19/17   Janye Maynor, Shaune Pascal, MD  Dulaglutide (TRULICITY) 1.5 SN/0.5LZ SOPN Inject contents of pen weekly Patient taking differently: Inject 1.5 mg into the skin every Friday.  06/03/17   Elayne Snare, MD  gabapentin (NEURONTIN) 300 MG capsule Take 1 capsule (300 mg total) by mouth 3 (three) times daily. 05/27/17   Narda Amber K, DO  insulin NPH-regular Human (NOVOLIN 70/30 RELION) (70-30) 100 UNIT/ML injection Inject 30 Units into the skin 2 (two) times daily with a meal. 12/26/15   Calone, Ples Specter, FNP  losartan (COZAAR) 25 MG tablet Take 12.5 mg by mouth at bedtime.     [provider]  magnesium oxide (MAG-OX) 400 MG tablet Take 1 tablet (400 mg total) by mouth daily. 06/01/12   Richardson Dopp T, PA-C  metFORMIN (GLUCOPHAGE-XR) 750 MG 24 hr tablet TAKE 2 TABLETS BY MOUTH ONCE DAILY WITH BREAKFAST Patient taking differently: Take 1,500 mg by mouth daily with breakfast.  04/01/17   [name], NP  Multiple Vitamins-Minerals (MULTIVITAMIN PO) Take 1 capsule by mouth daily.     [provider]  sotalol (BETAPACE) 120 MG tablet TAKE ONE TABLET BY MOUTH TWICE DAILY Patient taking differently: TAKE 120MG  BY MOUTH TWICE DAILY 07/31/16   Lelon Perla, MD  torsemide (DEMADEX) 100 MG tablet Take 50 mg by mouth See admin instructions. Take 50 mg by mouth daily except DO NOT TAKE on Sunday    [provider]    Past Medical History: Past Medical History:  Diagnosis Date  . AICD (automatic cardioverter/defibrillator) present 02/05/2014   Upgrade to Medtronic biventricular ICD, serial number  [id number]   . Atrial flutter ([place]) 04/2012   s/p TEE-EPS+RFCA 04/2012  . CAD (coronary artery disease) [phone] X 2    RCA-T, 70% PL (off CFX), 99% Prox LAD/90% Dist LAD, S/P TAXUS stent x 2  . CHF (congestive heart failure) ([place])   . Chronic anticoagulation   . Chronic systolic heart failure ([place])   . CKD (chronic kidney disease)   . Diabetic retinopathy ([place])   . DM type 2 (diabetes mellitus, type 2) ([facility])    insulin dependent  . HTN (hypertension)   . Hypercholesteremia    ablation  . ICD (implantable cardiac defibrillator) in place   . Ischemic cardiomyopathy March 2015   20-25% 2D   . Nephrolithiasis   . Ventricular tachycardia [facility])     Past Surgical History: Past Surgical History:  Procedure Laterality Date  . ATRIAL FLUTTER ABLATION N/A 05/19/2012   Procedure: ATRIAL FLUTTER ABLATION;  Surgeon: Thompson Grayer, MD;  Location: [facility] CATH LAB;  Service: Cardiovascular;  Laterality: N/A;  . BI-VENTRICULAR IMPLANTABLE CARDIOVERTER  DEFIBRILLATOR UPGRADE N/A 02/05/2014   Procedure: BI-VENTRICULAR IMPLANTABLE CARDIOVERTER DEFIBRILLATOR UPGRADE;  Surgeon: Evans Lance, MD;  Location: [facility] CATH LAB;  Service: Cardiovascular;  Laterality: N/A;  . BIV ICD GENERTAOR CHANGE OUT  02/05/2014   Upgrade to Medtronic biventricular ICD, serial number  [id number] by Dr. [name]  . CARDIAC DEFIBRILLATOR PLACEMENT  2007    Medtronic Maximo VR, serial number [id number]  . PERCUTANEOUS CORONARY STENT INTERVENTION (PCI-S)  January 2002   PTCA/Stent Distal RCA  . PERCUTANEOUS CORONARY STENT INTERVENTION (PCI-S)  June 2002   PTCA/Stent x 3 RCA, thrombolysis - failed  . PERCUTANEOUS CORONARY STENT INTERVENTION (PCI-S)  July 2006  TAXUS stents to prox and distal LAD  . RIGHT HEART CATH N/A 07/22/2017   Procedure: RIGHT HEART CATH;  Surgeon: Jolaine Artist, MD;  Location: [place] CV LAB;  Service: Cardiovascular;  Laterality: N/A;  . RIGHT/LEFT HEART CATH AND CORONARY ANGIOGRAPHY N/A 02/18/2017   Procedure: RIGHT/LEFT HEART CATH AND CORONARY ANGIOGRAPHY;  Surgeon: Jolaine Artist, MD;  Location: [employer] CV LAB;  Service: Cardiovascular;  Laterality: N/A;    Family History:  Family History  Problem Relation Age of Onset  . Heart failure Father        Deceased  . Alzheimer's disease Mother        Living  . Heart attack Mother   . CAD Brother   . Other Brother        blood cancer  . CAD Brother   . Prostate cancer Brother   . CAD Brother   . CAD Brother   . Healthy Son   . Healthy Daughter   . Diabetes Brother   . Stomach cancer Neg Hx   . Colon cancer Neg Hx     Social History: Social History   Socioeconomic History  . Marital status: Married    Spouse name: Not on file  . Number of children: 4  . Years of education: 15  . Highest education level: Not on file  Occupational History  . Occupation: Psychiatrist - currently unemployed  Social Needs  . Financial resource strain: Not on file  . Food  insecurity:    Worry: Not on file    Inability: Not on file  . Transportation needs:    Medical: Not on file    Non-medical: Not on file  Tobacco Use  . Smoking status: Former Smoker    Packs/day: 1.00    Years: 29.00    Pack years: 29.00    Types: Cigarettes    Last attempt to quit: 03/02/2000    Years since quitting: 17.4  . Smokeless tobacco: Never Used  Substance and Sexual Activity  . Alcohol use: No    Alcohol/week: 0.0 oz  . Drug use: No  . Sexual activity: Not on file  Lifestyle  . Physical activity:    Days per week: Not on file    Minutes per session: Not on file  . Stress: Not on file  Relationships  . Social connections:    Talks on phone: Not on file    Gets together: Not on file    Attends religious service: Not on file    Active member of club or organization: Not on file    Attends meetings of clubs or organizations: Not on file    Relationship status: Not on file  Other Topics Concern  . Not on file  Social History Narrative   Pt lives with wife in a one story home - married for 51 years. He hunts regularly and is active, walking > 1 mile at times without difficulty.  Has 4 children.     Retired Horticulturist, commercial.  Education: some college.    Allergies:  Allergies  Allergen Reactions  . Penicillins Hives, Itching and Other (See Comments)    Has patient had a PCN reaction causing immediate rash, facial/tongue/throat swelling, SOB or lightheadedness with hypotension: Yes Has patient had a PCN reaction causing severe rash involving mucus membranes or skin necrosis: No Has patient had a PCN reaction that required hospitalization: No Has patient had a PCN reaction occurring within the last 10 years: No  If all of the above answers are "NO", then may proceed with Cephalosporin use.     Objective:    Vital Signs:   Temp:  [96.3 F (35.7 C)] 96.3 F (35.7 C) (06/12 1413) BP: (100)/(71) 100/71 (06/12 1413) SpO2:  [96 %] 96 % (06/12 1413) Weight:  [249 lb 8  oz (113.2 kg)] 249 lb 8 oz (113.2 kg) (06/12 1413)   Filed Weights   08/11/17 1413  Weight: 249 lb 8 oz (113.2 kg)     Physical Exam    General:   No resp difficulty. Sitting on the side of the bed.  HEENT: normal anicteric Neck: supple. JVP to jaw  Carotids 2+ bilat; no bruits. No lymphadenopathy or thryomegaly appreciated. Cor: PMI nondisplaced. Irregular rate & rhythm. No rubs, or murmurs. R and LLE 3+  Lungs: clear no wheeze  Abdomen: soft, nontender, nondistended. No hepatosplenomegaly. No bruits or masses. Good bowel sounds. Extremities: warm, no cyanosis, clubbing, rash, 3+ edema Neuro: alert & oriented x 3, cranial nerves grossly intact. moves all 4 extremities w/o difficulty. Affect pleasant   Telemetry    V paced  EKG  V Paced 59 bpm   Labs     Basic Metabolic Panel: No results for input(s): NA, K, CL, CO2, GLUCOSE, BUN, CREATININE, CALCIUM, MG, PHOS in the last 168 hours.  Liver Function Tests: No results for input(s): AST, ALT, ALKPHOS, BILITOT, PROT, ALBUMIN in the last 168 hours. No results for input(s): LIPASE, AMYLASE in the last 168 hours. No results for input(s): AMMONIA in the last 168 hours.  CBC: No results for input(s): WBC, NEUTROABS, HGB, HCT, MCV, PLT in the last 168 hours.  Cardiac Enzymes: No results for input(s): CKTOTAL, CKMB, CKMBINDEX, TROPONINI in the last 168 hours.  BNP: BNP (last 3 results) Recent Labs    11/18/16 1355 01/15/17 0911  BNP 584.3* 159.6*    ProBNP (last 3 results) Recent Labs    11/12/16 1559  PROBNP 388.0*     CBG: No results for input(s): GLUCAP in the last 168 hours.  Coagulation Studies: No results for input(s): LABPROT, INR in the last 72 hours.  Imaging: No results found.   Patient Profile   [name] [name] is a 67 y.o. male with a past medical history of chronic systolic CHF due to ICM, [name], [name], [name], [name], [name], HTN, HL, and CKD  II-III.   Being directly admitted with persistent low cardiac output for milrinone initiation for home.   Assessment/Plan   1. Acute te/Chronic systolic CHF: - [facility] 11/5619 EF 20%. He has Medtronic BiV ICD in place.  - Cath 12/18 with stable 1v CAD.  - CPX testing 12/18 with submax test but severe HF limitation with exertional hypotension. Peak VO2: 9.3 (41% predicted peak VO2). VE/VCO2 slope: 53 - Recent  RHC on 5/23 with low output CO 3.5 /CI 1.6  - Dr [name] discussed with Dr Posey Pronto. w Dr. Posey Pronto at [place] scan suggestive of TTR amyloid. - Plan to start milrinone 0.25 mcg and anticipate d/c home with it. I will notify [facility].  - Marked volume overload. Start 80 mg IV lasix twice daily.  - Continue losartan 12.5. BP too low to titrate or switch to [employer] - Off b-blocker due to low output and low BP and bradycardia - Continue digoxin 0.125 - On sotalol for VT. Continue for now.    2. CAD s/p PCI  of RCA and LAD  - recent cath with stable CAD as above - - Off ASA with apixaban. Continue statin  - No s/s ischemia   3. [name] - Recent A1c 8.3 on 6/3.   Cover with SSI in house.  -In the community he is followed by Endocrinology.   4. CKD II-III Creatinine baseline 1.2-1.4   5. [name] - s/p ICD. Quiescent.  - Continue sotalol.   6. PAF s/p ablation 04/2012 Looks like he is back in A fib. Will ask Medtronic to interrogate his device. Device interrogation showed A fib since May 16th.  - On Eliquis at home. Per IR need to hold at LEAST 24 hrs, ideally 48 hrs.  - IR to place PICC in am.    Darrick Grinder NP-C  3:42 PM   Advanced Heart Failure Team Pager [phone] (M-F; [place])  Please contact [facility] for night-coverage after hours (4p -7a ) and weekends on [email or web address]  Patient seen and examined with Darrick Grinder, NP. We discussed all aspects of the encounter. I agree with the assessment and plan as stated above.   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 Blood type O. Recent RHC with low output and volume overload. Scheduled for admit today for home milrinone initiation. On arrival, had marked volume overload with NYHA IIIB-IV symptoms. ICD interrogation also shows 1 month h/o PAF. Eliquis stopped for tunneled PICC line placement in am.   Multiple active issues. He seems to be progressing quickly and given blood type O I do not think he will make it to transplant in time and VAD may be best option. However, it is unclear to me if restoring NSR my buy him more time. Will start milrinone. Start IV diuresis. Plan PICC line placement tomorrow. Will likely need TEE/DC-CV prior to d/c. Will d/w VAD team. ICD interrogated personally.   Glori Bickers, MD  6:06 PM

## 2017-08-10 NOTE — Telephone Encounter (Signed)
Spoke with pt and reminded pt of remote transmission that is due today. He will send report later today.  He stated he has been trying to reach HF clinic to discuss a procedure that he supposed to have tomorrow. He said it is inserting some kind of IV.  Advised I did not see a procedure scheduled but would send this to HF clinic for follow up.  Office note 6/3 states that patient will have PICC line insertion.

## 2017-08-11 ENCOUNTER — Inpatient Hospital Stay (HOSPITAL_COMMUNITY)
Admission: AD | Admit: 2017-08-11 | Discharge: 2017-09-10 | DRG: 001 | Disposition: A | Discharge status: Rehabilitation Facility | Payer: PPO | Attending: Internal Medicine | Admitting: Internal Medicine

## 2017-08-11 ENCOUNTER — Other Ambulatory Visit: Payer: Self-pay | Admitting: Nurse Practitioner

## 2017-08-11 ENCOUNTER — Encounter (HOSPITAL_COMMUNITY): Payer: Self-pay

## 2017-08-11 ENCOUNTER — Other Ambulatory Visit: Payer: Self-pay

## 2017-08-11 ENCOUNTER — Other Ambulatory Visit (HOSPITAL_COMMUNITY): Payer: Self-pay | Admitting: Internal Medicine

## 2017-08-11 DIAGNOSIS — I96 Gangrene, not elsewhere classified: Secondary | ICD-10-CM | POA: Diagnosis present

## 2017-08-11 DIAGNOSIS — Z9889 Other specified postprocedural states: Secondary | ICD-10-CM

## 2017-08-11 DIAGNOSIS — I081 Rheumatic disorders of both mitral and tricuspid valves: Secondary | ICD-10-CM | POA: Diagnosis not present

## 2017-08-11 DIAGNOSIS — Z515 Encounter for palliative care: Secondary | ICD-10-CM | POA: Diagnosis not present

## 2017-08-11 DIAGNOSIS — R0989 Other specified symptoms and signs involving the circulatory and respiratory systems: Secondary | ICD-10-CM | POA: Diagnosis not present

## 2017-08-11 DIAGNOSIS — I130 Hypertensive heart and chronic kidney disease with heart failure and stage 1 through stage 4 chronic kidney disease, or unspecified chronic kidney disease: Secondary | ICD-10-CM | POA: Diagnosis not present

## 2017-08-11 DIAGNOSIS — E873 Alkalosis: Secondary | ICD-10-CM | POA: Diagnosis not present

## 2017-08-11 DIAGNOSIS — Z419 Encounter for procedure for purposes other than remedying health state, unspecified: Secondary | ICD-10-CM

## 2017-08-11 DIAGNOSIS — E785 Hyperlipidemia, unspecified: Secondary | ICD-10-CM | POA: Diagnosis present

## 2017-08-11 DIAGNOSIS — Z978 Presence of other specified devices: Secondary | ICD-10-CM

## 2017-08-11 DIAGNOSIS — I97638 Postprocedural hematoma of a circulatory system organ or structure following other circulatory system procedure: Secondary | ICD-10-CM | POA: Diagnosis not present

## 2017-08-11 DIAGNOSIS — Z9289 Personal history of other medical treatment: Secondary | ICD-10-CM

## 2017-08-11 DIAGNOSIS — T501X5A Adverse effect of loop [high-ceiling] diuretics, initial encounter: Secondary | ICD-10-CM | POA: Diagnosis not present

## 2017-08-11 DIAGNOSIS — E876 Hypokalemia: Secondary | ICD-10-CM | POA: Diagnosis not present

## 2017-08-11 DIAGNOSIS — E1122 Type 2 diabetes mellitus with diabetic chronic kidney disease: Secondary | ICD-10-CM | POA: Diagnosis present

## 2017-08-11 DIAGNOSIS — I9789 Other postprocedural complications and disorders of the circulatory system, not elsewhere classified: Secondary | ICD-10-CM | POA: Diagnosis not present

## 2017-08-11 DIAGNOSIS — D631 Anemia in chronic kidney disease: Secondary | ICD-10-CM | POA: Diagnosis present

## 2017-08-11 DIAGNOSIS — L89302 Pressure ulcer of unspecified buttock, stage 2: Secondary | ICD-10-CM | POA: Diagnosis not present

## 2017-08-11 DIAGNOSIS — Z7189 Other specified counseling: Secondary | ICD-10-CM | POA: Diagnosis not present

## 2017-08-11 DIAGNOSIS — N179 Acute kidney failure, unspecified: Secondary | ICD-10-CM | POA: Diagnosis present

## 2017-08-11 DIAGNOSIS — L89152 Pressure ulcer of sacral region, stage 2: Secondary | ICD-10-CM | POA: Diagnosis not present

## 2017-08-11 DIAGNOSIS — R0682 Tachypnea, not elsewhere classified: Secondary | ICD-10-CM

## 2017-08-11 DIAGNOSIS — E870 Hyperosmolality and hypernatremia: Secondary | ICD-10-CM | POA: Diagnosis not present

## 2017-08-11 DIAGNOSIS — I255 Ischemic cardiomyopathy: Secondary | ICD-10-CM | POA: Diagnosis present

## 2017-08-11 DIAGNOSIS — I4891 Unspecified atrial fibrillation: Secondary | ICD-10-CM | POA: Diagnosis not present

## 2017-08-11 DIAGNOSIS — Z8249 Family history of ischemic heart disease and other diseases of the circulatory system: Secondary | ICD-10-CM

## 2017-08-11 DIAGNOSIS — I472 Ventricular tachycardia: Secondary | ICD-10-CM | POA: Diagnosis present

## 2017-08-11 DIAGNOSIS — Z7901 Long term (current) use of anticoagulants: Secondary | ICD-10-CM

## 2017-08-11 DIAGNOSIS — I509 Heart failure, unspecified: Secondary | ICD-10-CM

## 2017-08-11 DIAGNOSIS — Z4659 Encounter for fitting and adjustment of other gastrointestinal appliance and device: Secondary | ICD-10-CM

## 2017-08-11 DIAGNOSIS — I5023 Acute on chronic systolic (congestive) heart failure: Secondary | ICD-10-CM | POA: Diagnosis present

## 2017-08-11 DIAGNOSIS — J9690 Respiratory failure, unspecified, unspecified whether with hypoxia or hypercapnia: Secondary | ICD-10-CM

## 2017-08-11 DIAGNOSIS — I5022 Chronic systolic (congestive) heart failure: Secondary | ICD-10-CM | POA: Diagnosis not present

## 2017-08-11 DIAGNOSIS — N183 Chronic kidney disease, stage 3 unspecified: Secondary | ICD-10-CM

## 2017-08-11 DIAGNOSIS — E854 Organ-limited amyloidosis: Secondary | ICD-10-CM | POA: Diagnosis present

## 2017-08-11 DIAGNOSIS — I310 Chronic adhesive pericarditis: Secondary | ICD-10-CM | POA: Diagnosis not present

## 2017-08-11 DIAGNOSIS — I2510 Atherosclerotic heart disease of native coronary artery without angina pectoris: Secondary | ICD-10-CM | POA: Diagnosis not present

## 2017-08-11 DIAGNOSIS — Z955 Presence of coronary angioplasty implant and graft: Secondary | ICD-10-CM | POA: Diagnosis not present

## 2017-08-11 DIAGNOSIS — E11319 Type 2 diabetes mellitus with unspecified diabetic retinopathy without macular edema: Secondary | ICD-10-CM | POA: Diagnosis not present

## 2017-08-11 DIAGNOSIS — E11649 Type 2 diabetes mellitus with hypoglycemia without coma: Secondary | ICD-10-CM | POA: Diagnosis not present

## 2017-08-11 DIAGNOSIS — E7800 Pure hypercholesterolemia, unspecified: Secondary | ICD-10-CM | POA: Diagnosis present

## 2017-08-11 DIAGNOSIS — R001 Bradycardia, unspecified: Secondary | ICD-10-CM | POA: Diagnosis present

## 2017-08-11 DIAGNOSIS — Z95811 Presence of heart assist device: Secondary | ICD-10-CM | POA: Diagnosis not present

## 2017-08-11 DIAGNOSIS — I97630 Postprocedural hematoma of a circulatory system organ or structure following a cardiac catheterization: Secondary | ICD-10-CM | POA: Diagnosis not present

## 2017-08-11 DIAGNOSIS — Z95810 Presence of automatic (implantable) cardiac defibrillator: Secondary | ICD-10-CM | POA: Diagnosis not present

## 2017-08-11 DIAGNOSIS — Z452 Encounter for adjustment and management of vascular access device: Secondary | ICD-10-CM | POA: Diagnosis not present

## 2017-08-11 DIAGNOSIS — E1140 Type 2 diabetes mellitus with diabetic neuropathy, unspecified: Secondary | ICD-10-CM | POA: Diagnosis present

## 2017-08-11 DIAGNOSIS — D72829 Elevated white blood cell count, unspecified: Secondary | ICD-10-CM | POA: Diagnosis not present

## 2017-08-11 DIAGNOSIS — L89310 Pressure ulcer of right buttock, unstageable: Secondary | ICD-10-CM | POA: Diagnosis not present

## 2017-08-11 DIAGNOSIS — R0602 Shortness of breath: Secondary | ICD-10-CM | POA: Diagnosis not present

## 2017-08-11 DIAGNOSIS — E1165 Type 2 diabetes mellitus with hyperglycemia: Secondary | ICD-10-CM | POA: Diagnosis present

## 2017-08-11 DIAGNOSIS — E8809 Other disorders of plasma-protein metabolism, not elsewhere classified: Secondary | ICD-10-CM | POA: Diagnosis not present

## 2017-08-11 DIAGNOSIS — J90 Pleural effusion, not elsewhere classified: Secondary | ICD-10-CM | POA: Diagnosis not present

## 2017-08-11 DIAGNOSIS — E669 Obesity, unspecified: Secondary | ICD-10-CM | POA: Diagnosis present

## 2017-08-11 DIAGNOSIS — I4901 Ventricular fibrillation: Secondary | ICD-10-CM | POA: Diagnosis not present

## 2017-08-11 DIAGNOSIS — R1310 Dysphagia, unspecified: Secondary | ICD-10-CM | POA: Diagnosis not present

## 2017-08-11 DIAGNOSIS — I252 Old myocardial infarction: Secondary | ICD-10-CM

## 2017-08-11 DIAGNOSIS — J810 Acute pulmonary edema: Secondary | ICD-10-CM | POA: Diagnosis not present

## 2017-08-11 DIAGNOSIS — Z87891 Personal history of nicotine dependence: Secondary | ICD-10-CM | POA: Diagnosis not present

## 2017-08-11 DIAGNOSIS — R509 Fever, unspecified: Secondary | ICD-10-CM | POA: Diagnosis not present

## 2017-08-11 DIAGNOSIS — J939 Pneumothorax, unspecified: Secondary | ICD-10-CM

## 2017-08-11 DIAGNOSIS — J9600 Acute respiratory failure, unspecified whether with hypoxia or hypercapnia: Secondary | ICD-10-CM | POA: Diagnosis not present

## 2017-08-11 DIAGNOSIS — R5381 Other malaise: Secondary | ICD-10-CM | POA: Diagnosis present

## 2017-08-11 DIAGNOSIS — I340 Nonrheumatic mitral (valve) insufficiency: Secondary | ICD-10-CM | POA: Diagnosis not present

## 2017-08-11 DIAGNOSIS — I43 Cardiomyopathy in diseases classified elsewhere: Secondary | ICD-10-CM | POA: Diagnosis present

## 2017-08-11 DIAGNOSIS — Z833 Family history of diabetes mellitus: Secondary | ICD-10-CM

## 2017-08-11 DIAGNOSIS — K629 Disease of anus and rectum, unspecified: Secondary | ICD-10-CM | POA: Diagnosis not present

## 2017-08-11 DIAGNOSIS — E0842 Diabetes mellitus due to underlying condition with diabetic polyneuropathy: Secondary | ICD-10-CM

## 2017-08-11 DIAGNOSIS — N182 Chronic kidney disease, stage 2 (mild): Secondary | ICD-10-CM | POA: Diagnosis present

## 2017-08-11 DIAGNOSIS — Z4682 Encounter for fitting and adjustment of non-vascular catheter: Secondary | ICD-10-CM | POA: Diagnosis not present

## 2017-08-11 DIAGNOSIS — I5082 Biventricular heart failure: Secondary | ICD-10-CM | POA: Diagnosis not present

## 2017-08-11 DIAGNOSIS — G4700 Insomnia, unspecified: Secondary | ICD-10-CM | POA: Diagnosis not present

## 2017-08-11 DIAGNOSIS — I5043 Acute on chronic combined systolic (congestive) and diastolic (congestive) heart failure: Secondary | ICD-10-CM | POA: Diagnosis not present

## 2017-08-11 DIAGNOSIS — I10 Essential (primary) hypertension: Secondary | ICD-10-CM | POA: Diagnosis not present

## 2017-08-11 DIAGNOSIS — L89320 Pressure ulcer of left buttock, unstageable: Secondary | ICD-10-CM | POA: Diagnosis not present

## 2017-08-11 DIAGNOSIS — Z4502 Encounter for adjustment and management of automatic implantable cardiac defibrillator: Secondary | ICD-10-CM

## 2017-08-11 DIAGNOSIS — L8990 Pressure ulcer of unspecified site, unspecified stage: Secondary | ICD-10-CM

## 2017-08-11 DIAGNOSIS — Y838 Other surgical procedures as the cause of abnormal reaction of the patient, or of later complication, without mention of misadventure at the time of the procedure: Secondary | ICD-10-CM | POA: Diagnosis not present

## 2017-08-11 DIAGNOSIS — J9601 Acute respiratory failure with hypoxia: Secondary | ICD-10-CM | POA: Diagnosis not present

## 2017-08-11 DIAGNOSIS — Z79899 Other long term (current) drug therapy: Secondary | ICD-10-CM | POA: Diagnosis not present

## 2017-08-11 DIAGNOSIS — J9811 Atelectasis: Secondary | ICD-10-CM | POA: Diagnosis not present

## 2017-08-11 DIAGNOSIS — R918 Other nonspecific abnormal finding of lung field: Secondary | ICD-10-CM | POA: Diagnosis not present

## 2017-08-11 DIAGNOSIS — R7309 Other abnormal glucose: Secondary | ICD-10-CM | POA: Diagnosis not present

## 2017-08-11 DIAGNOSIS — D62 Acute posthemorrhagic anemia: Secondary | ICD-10-CM

## 2017-08-11 DIAGNOSIS — I480 Paroxysmal atrial fibrillation: Secondary | ICD-10-CM | POA: Diagnosis present

## 2017-08-11 DIAGNOSIS — Z95828 Presence of other vascular implants and grafts: Secondary | ICD-10-CM

## 2017-08-11 DIAGNOSIS — Z6832 Body mass index (BMI) 32.0-32.9, adult: Secondary | ICD-10-CM

## 2017-08-11 DIAGNOSIS — Z880 Allergy status to penicillin: Secondary | ICD-10-CM

## 2017-08-11 DIAGNOSIS — R570 Cardiogenic shock: Secondary | ICD-10-CM | POA: Diagnosis not present

## 2017-08-11 DIAGNOSIS — N429 Disorder of prostate, unspecified: Secondary | ICD-10-CM | POA: Diagnosis not present

## 2017-08-11 DIAGNOSIS — I4892 Unspecified atrial flutter: Secondary | ICD-10-CM | POA: Diagnosis not present

## 2017-08-11 DIAGNOSIS — I071 Rheumatic tricuspid insufficiency: Secondary | ICD-10-CM | POA: Diagnosis present

## 2017-08-11 DIAGNOSIS — Z794 Long term (current) use of insulin: Secondary | ICD-10-CM

## 2017-08-11 DIAGNOSIS — E119 Type 2 diabetes mellitus without complications: Secondary | ICD-10-CM | POA: Diagnosis not present

## 2017-08-11 DIAGNOSIS — G479 Sleep disorder, unspecified: Secondary | ICD-10-CM | POA: Diagnosis not present

## 2017-08-11 DIAGNOSIS — E871 Hypo-osmolality and hyponatremia: Secondary | ICD-10-CM | POA: Diagnosis not present

## 2017-08-11 LAB — CBC WITH DIFFERENTIAL/PLATELET
Abs Immature Granulocytes: 0 10*3/uL (ref 0.0–0.1)
BASOS PCT: 1 %
Basophils Absolute: 0 10*3/uL (ref 0.0–0.1)
EOS ABS: 0.3 10*3/uL (ref 0.0–0.7)
Eosinophils Relative: 5 %
HEMATOCRIT: 36.8 % — AB (ref 39.0–52.0)
Hemoglobin: 11.1 g/dL — ABNORMAL LOW (ref 13.0–17.0)
Immature Granulocytes: 0 %
Lymphocytes Relative: 26 %
Lymphs Abs: 1.3 10*3/uL (ref 0.7–4.0)
MCH: 27.1 pg (ref 26.0–34.0)
MCHC: 30.2 g/dL (ref 30.0–36.0)
MCV: 89.8 fL (ref 78.0–100.0)
MONO ABS: 0.6 10*3/uL (ref 0.1–1.0)
MONOS PCT: 13 %
Neutro Abs: 2.7 10*3/uL (ref 1.7–7.7)
Neutrophils Relative %: 55 %
PLATELETS: 176 10*3/uL (ref 150–400)
RBC: 4.1 MIL/uL — ABNORMAL LOW (ref 4.22–5.81)
RDW: 16 % — AB (ref 11.5–15.5)
WBC: 4.9 10*3/uL (ref 4.0–10.5)

## 2017-08-11 LAB — COMPREHENSIVE METABOLIC PANEL
ALBUMIN: 2.9 g/dL — AB (ref 3.5–5.0)
ALK PHOS: 111 U/L (ref 38–126)
ALT: 58 U/L (ref 17–63)
AST: 58 U/L — AB (ref 15–41)
Anion gap: 8 (ref 5–15)
BUN: 21 mg/dL — AB (ref 6–20)
CALCIUM: 9.2 mg/dL (ref 8.9–10.3)
CO2: 25 mmol/L (ref 22–32)
CREATININE: 1.47 mg/dL — AB (ref 0.61–1.24)
Chloride: 105 mmol/L (ref 101–111)
GFR calc Af Amer: 55 mL/min — ABNORMAL LOW (ref >60)
GFR calc non Af Amer: 48 mL/min — ABNORMAL LOW (ref >60)
GLUCOSE: 199 mg/dL — AB (ref 65–99)
Potassium: 4.6 mmol/L (ref 3.5–5.1)
SODIUM: 138 mmol/L (ref 135–145)
Total Bilirubin: 1.2 mg/dL (ref 0.3–1.2)
Total Protein: 7.9 g/dL (ref 6.5–8.1)

## 2017-08-11 LAB — MRSA PCR SCREENING: MRSA by PCR: NEGATIVE

## 2017-08-11 LAB — GLUCOSE, CAPILLARY
Glucose-Capillary: 187 mg/dL — ABNORMAL HIGH (ref 65–99)
Glucose-Capillary: 191 mg/dL — ABNORMAL HIGH (ref 65–99)

## 2017-08-11 LAB — PROTIME-INR
INR: 1.8
PROTHROMBIN TIME: 20.7 s — AB (ref 11.4–15.2)

## 2017-08-11 LAB — BRAIN NATRIURETIC PEPTIDE: B Natriuretic Peptide: 529.3 pg/mL — ABNORMAL HIGH (ref 0.0–100.0)

## 2017-08-11 MED ORDER — INSULIN ASPART 100 UNIT/ML ~~LOC~~ SOLN
4.0000 [IU] | Freq: Three times a day (TID) | SUBCUTANEOUS | Status: DC
  Administered 2017-08-11 – 2017-08-15 (×8): 4 [IU] via SUBCUTANEOUS

## 2017-08-11 MED ORDER — MAGNESIUM OXIDE 400 (241.3 MG) MG PO TABS
400.0000 mg | ORAL_TABLET | Freq: Every day | ORAL | Status: DC
  Administered 2017-08-11 – 2017-08-15 (×5): 400 mg via ORAL
  Filled 2017-08-11 (×5): qty 1

## 2017-08-11 MED ORDER — SODIUM CHLORIDE 0.9% FLUSH: 3.0000 mL | INTRAVENOUS | Status: DC | PRN

## 2017-08-11 MED ORDER — GABAPENTIN 300 MG PO CAPS
300.0000 mg | ORAL_CAPSULE | Freq: Three times a day (TID) | ORAL | Status: DC
  Administered 2017-08-11 – 2017-08-15 (×14): 300 mg via ORAL
  Filled 2017-08-11 (×14): qty 1

## 2017-08-11 MED ORDER — ATORVASTATIN CALCIUM 80 MG PO TABS
80.0000 mg | ORAL_TABLET | Freq: Every day | ORAL | Status: DC
  Administered 2017-08-12 – 2017-08-15 (×4): 80 mg via ORAL
  Filled 2017-08-11 (×4): qty 1

## 2017-08-11 MED ORDER — DIGOXIN 125 MCG PO TABS
0.1250 mg | ORAL_TABLET | Freq: Every day | ORAL | Status: DC
  Administered 2017-08-11 – 2017-08-15 (×5): 0.125 mg via ORAL
  Filled 2017-08-11 (×5): qty 1

## 2017-08-11 MED ORDER — MILRINONE LACTATE IN DEXTROSE 20-5 MG/100ML-% IV SOLN
0.5000 ug/kg/min | INTRAVENOUS | Status: DC
  Administered 2017-08-11 – 2017-08-12 (×3): 0.25 ug/kg/min via INTRAVENOUS
  Administered 2017-08-13: 0.375 ug/kg/min via INTRAVENOUS
  Administered 2017-08-13: 0.25 ug/kg/min via INTRAVENOUS
  Administered 2017-08-14: 0.375 ug/kg/min via INTRAVENOUS
  Administered 2017-08-14: 0.5 ug/kg/min via INTRAVENOUS
  Administered 2017-08-14: 0.375 ug/kg/min via INTRAVENOUS
  Administered 2017-08-15 (×4): 0.5 ug/kg/min via INTRAVENOUS
  Administered 2017-08-16: 0.4 ug/kg/min via INTRAVENOUS
  Administered 2017-08-16: 0.375 ug/kg/min via INTRAVENOUS
  Administered 2017-08-16: 0.5 ug/kg/min via INTRAVENOUS
  Administered 2017-08-17: 0.4 ug/kg/min via INTRAVENOUS
  Administered 2017-08-17: 0.5 ug/kg/min via INTRAVENOUS
  Filled 2017-08-11 (×20): qty 100

## 2017-08-11 MED ORDER — FUROSEMIDE 10 MG/ML IJ SOLN
INTRAMUSCULAR | Status: AC
  Administered 2017-08-11: 80 mg via INTRAVENOUS
  Filled 2017-08-11: qty 8

## 2017-08-11 MED ORDER — ACETAMINOPHEN 500 MG PO TABS: 1000.0000 mg | ORAL_TABLET | Freq: Four times a day (QID) | ORAL | Status: DC | PRN

## 2017-08-11 MED ORDER — INSULIN ASPART 100 UNIT/ML ~~LOC~~ SOLN
0.0000 [IU] | Freq: Every day | SUBCUTANEOUS | Status: DC
  Administered 2017-08-12: 3 [IU] via SUBCUTANEOUS
  Administered 2017-08-14: 2 [IU] via SUBCUTANEOUS
  Administered 2017-08-15: 3 [IU] via SUBCUTANEOUS

## 2017-08-11 MED ORDER — LOSARTAN POTASSIUM 25 MG PO TABS
12.5000 mg | ORAL_TABLET | Freq: Every day | ORAL | Status: DC
  Administered 2017-08-11 – 2017-08-15 (×5): 12.5 mg via ORAL
  Filled 2017-08-11 (×5): qty 1

## 2017-08-11 MED ORDER — SOTALOL HCL 120 MG PO TABS
120.0000 mg | ORAL_TABLET | Freq: Two times a day (BID) | ORAL | Status: DC
  Administered 2017-08-11 – 2017-08-14 (×7): 120 mg via ORAL
  Filled 2017-08-11 (×9): qty 1

## 2017-08-11 MED ORDER — ONDANSETRON HCL 4 MG/2ML IJ SOLN
4.0000 mg | Freq: Four times a day (QID) | INTRAMUSCULAR | Status: DC | PRN
Start: 2017-08-11 — End: 2017-08-13

## 2017-08-11 MED ORDER — SODIUM CHLORIDE 0.9% FLUSH
3.0000 mL | Freq: Two times a day (BID) | INTRAVENOUS | Status: DC
  Administered 2017-08-11 – 2017-08-12 (×3): 3 mL via INTRAVENOUS

## 2017-08-11 MED ORDER — SODIUM CHLORIDE 0.9 % IV SOLN: 250.0000 mL | INTRAVENOUS | Status: DC | PRN

## 2017-08-11 MED ORDER — INSULIN ASPART 100 UNIT/ML ~~LOC~~ SOLN
0.0000 [IU] | Freq: Three times a day (TID) | SUBCUTANEOUS | Status: DC
  Administered 2017-08-11 – 2017-08-13 (×5): 3 [IU] via SUBCUTANEOUS
  Administered 2017-08-13: 2 [IU] via SUBCUTANEOUS
  Administered 2017-08-14: 3 [IU] via SUBCUTANEOUS
  Administered 2017-08-14: 5 [IU] via SUBCUTANEOUS
  Administered 2017-08-15: 8 [IU] via SUBCUTANEOUS
  Administered 2017-08-15 (×2): 3 [IU] via SUBCUTANEOUS

## 2017-08-11 MED ORDER — FUROSEMIDE 10 MG/ML IJ SOLN
80.0000 mg | Freq: Two times a day (BID) | INTRAMUSCULAR | Status: DC
  Administered 2017-08-11 – 2017-08-13 (×4): 80 mg via INTRAVENOUS
  Filled 2017-08-11 (×4): qty 8

## 2017-08-11 MED ORDER — PNEUMOCOCCAL VAC POLYVALENT 25 MCG/0.5ML IJ INJ: 0.5000 mL | INJECTION | INTRAMUSCULAR | Status: DC

## 2017-08-11 MED ORDER — ACETAMINOPHEN 325 MG PO TABS: 650.0000 mg | ORAL_TABLET | ORAL | Status: DC | PRN

## 2017-08-11 MED ORDER — TORSEMIDE 20 MG PO TABS: 50.0000 mg | ORAL_TABLET | ORAL | Status: DC

## 2017-08-11 NOTE — Consult Note (Signed)
   Patient Status: [facility] - In-pt  Assessment and Plan: Patient in need of venous access for home inotrope administration. Plan for image-guided tunneled central venous catheter placement tomorrow with Dr. Anselm Pancoast. Patient will be NPO at midnight. Denies fever. Patient is currently taking Eliquis- last dose this AM. Discussed this finding with Dr. Kathlene Cote. Per Dr. Kathlene Cote, ok to proceed with procedure after holding Eliquis at least 1 day.  Risks and benefits discussed with the patient including, but not limited to bleeding, infection, and vessel damage. All of the patient's questions were answered, patient is agreeable to proceed. Consent signed and in chart.  ______________________________________________________________________   History of Present Illness: [name] [name] is a 67 y.o. male with a past medical history of hypertension, HF, ischemic cardiomyopathy, ventricular tachycardia, atrial flutter, CAD with ICD in place, hypercholesteremia, diabetes mellitus type II with associated diabetic retinopathy, CKD, and nephrolithiasis. He was directly admitted by Dr. [name] for initiation of inotropes.  IR requested by Dr. [name] for possible image-guided tunneled central venous catheter placement. Patient awake and alert sitting in bed with no complaints at this time. Accompanied by wife and daughter at bedside. Denies fever, chills, chest pain, dyspnea, abdominal pain, or dizziness.  Patient is currently taking Eliquis- last dose this AM.  Allergies and medications reviewed.   Review of Systems: A 12 point ROS discussed and pertinent positives are indicated in the HPI above.  All other systems are negative.  Review of Systems  Constitutional: Negative for chills and fever.  Respiratory: Negative for shortness of breath and wheezing.   Cardiovascular: Negative for chest pain and palpitations.  Gastrointestinal: Negative for abdominal distention.  Neurological: Negative for  dizziness.  Psychiatric/Behavioral: Negative for behavioral problems and confusion.    Vital Signs: BP 100/71 (BP Location: Right Arm)   Temp (!) 96.3 F (35.7 C) (Axillary)   Ht 5\' 11"  (1.803 m)   Wt 249 lb 8 oz (113.2 kg)   SpO2 96%   BMI 34.80 kg/m   Physical Exam  Constitutional: He is oriented to person, place, and time. He appears well-developed and well-nourished. No distress.  Cardiovascular: Normal rate, regular rhythm and normal heart sounds.  No murmur heard. Pulmonary/Chest: Effort normal and breath sounds normal. No respiratory distress. He has no wheezes.  Neurological: He is alert and oriented to person, place, and time.  Skin: Skin is warm and dry.  Psychiatric: He has a normal mood and affect. His behavior is normal. Judgment and thought content normal.  Nursing note and vitals reviewed.    Imaging reviewed.   Labs:  COAGS: No results for input(s): INR, APTT in the last 8760 hours.  BMP: Recent Labs    05/24/17 0906 06/24/17 0958 07/22/17 0942 08/02/17 1121  NA 136 136 141 139  K 3.8 4.0 4.5 4.3  CL 102 102 106 103  CO2 26 24 26 27   GLUCOSE 166* 256* 161* 186*  BUN 20 26* 29* 22*  CALCIUM 9.4 9.5 9.1 9.5  CREATININE 0.98 1.23 1.33* 1.24  GFRNONAA >60 59* 54* 58*  GFRAA >60 >60 >60 >60       Electronically Signed: Earley Abide, PA-C 08/11/2017, 3:34 PM   I spent a total of 15 minutes in face to face in clinical consultation, greater than 50% of which was counseling/coordinating care for venous access.

## 2017-08-11 NOTE — Telephone Encounter (Signed)
Tanda Rockers RN, VAD coordinator is arranging this

## 2017-08-11 NOTE — Telephone Encounter (Signed)
I contacted [name] [name] to schedule an appointment. He stated he has a procedure (PICC line placement) scheduled for tomorrow and has plans for Friday. I advised that I would reach out next week so that he could have Thursday to rest and he agreed. He was asked to call me if he had any problems before next week and he agreed.

## 2017-08-11 NOTE — Telephone Encounter (Signed)
Routed to [place].

## 2017-08-11 NOTE — Progress Notes (Signed)
BiV Pacer/AICD interrogated per MedTronic rep at this time.

## 2017-08-11 NOTE — Care Management Note (Signed)
Case Management Note  Patient Details  Name: [name] [name] MRN: [id number] Date of Birth: [birth year]  Subjective/Objective:   Pt admitted for HF - will need home milrinone               Action/Plan:  PTA independent from home with wife.  Pt states he has PCP and denied barriers with obtaining/paying for medications.  Pt informed CM that he weights daily and adheres to a low salt diet.  CM gave [facility] choice for infusion - [facility] agreed to by pt and HF team, agency contacted and tentative referral given.  CM will continue to follow   Expected Discharge Date:  08/13/17               Expected Discharge Plan:  [place]  In-House Referral:     Discharge planning Services  CM Consult  Post Acute Care Choice:    Choice offered to:     DME Arranged:    DME Agency:     HH Arranged:    HH Agency:     Status of Service:     If discussed at [employer], dates discussed:    Additional Comments:  Maryclare Labrador, RN 08/11/2017, 4:22 PM

## 2017-08-12 ENCOUNTER — Inpatient Hospital Stay (HOSPITAL_COMMUNITY): Payer: PPO

## 2017-08-12 ENCOUNTER — Encounter (HOSPITAL_COMMUNITY): Payer: Self-pay | Admitting: Diagnostic Radiology

## 2017-08-12 HISTORY — PX: IR FLUORO GUIDE CV LINE RIGHT: IMG2283

## 2017-08-12 HISTORY — PX: IR US GUIDE VASC ACCESS RIGHT: IMG2390

## 2017-08-12 LAB — GLUCOSE, CAPILLARY
GLUCOSE-CAPILLARY: 177 mg/dL — AB (ref 65–99)
Glucose-Capillary: 200 mg/dL — ABNORMAL HIGH (ref 65–99)
Glucose-Capillary: 174 mg/dL — ABNORMAL HIGH (ref 65–99)
Glucose-Capillary: 165 mg/dL — ABNORMAL HIGH (ref 65–99)

## 2017-08-12 LAB — BASIC METABOLIC PANEL
Anion gap: 10 (ref 5–15)
BUN: 19 mg/dL (ref 6–20)
CHLORIDE: 106 mmol/L (ref 101–111)
CO2: 24 mmol/L (ref 22–32)
CREATININE: 1.29 mg/dL — AB (ref 0.61–1.24)
Calcium: 8.9 mg/dL (ref 8.9–10.3)
GFR calc non Af Amer: 56 mL/min — ABNORMAL LOW (ref >60)
Glucose, Bld: 204 mg/dL — ABNORMAL HIGH (ref 65–99)
Potassium: 3.6 mmol/L (ref 3.5–5.1)
Sodium: 140 mmol/L (ref 135–145)

## 2017-08-12 LAB — CBC
HEMATOCRIT: 35.9 % — AB (ref 39.0–52.0)
HEMOGLOBIN: 10.9 g/dL — AB (ref 13.0–17.0)
MCH: 26.8 pg (ref 26.0–34.0)
MCHC: 30.4 g/dL (ref 30.0–36.0)
MCV: 88.2 fL (ref 78.0–100.0)
Platelets: 171 10*3/uL (ref 150–400)
RBC: 4.07 MIL/uL — AB (ref 4.22–5.81)
RDW: 15.9 % — ABNORMAL HIGH (ref 11.5–15.5)
WBC: 5.6 10*3/uL (ref 4.0–10.5)

## 2017-08-12 LAB — MAGNESIUM: Magnesium: 2 mg/dL (ref 1.7–2.4)

## 2017-08-12 MED ORDER — POTASSIUM CHLORIDE CRYS ER 20 MEQ PO TBCR
40.0000 meq | EXTENDED_RELEASE_TABLET | Freq: Two times a day (BID) | ORAL | Status: DC
  Administered 2017-08-12 – 2017-08-15 (×8): 40 meq via ORAL
  Filled 2017-08-12 (×8): qty 2

## 2017-08-12 MED ORDER — SODIUM CHLORIDE 0.9% FLUSH
3.0000 mL | Freq: Two times a day (BID) | INTRAVENOUS | Status: DC
  Administered 2017-08-12: 3 mL via INTRAVENOUS

## 2017-08-12 MED ORDER — SODIUM CHLORIDE 0.9% FLUSH: 10.0000 mL | INTRAVENOUS | Status: DC | PRN

## 2017-08-12 MED ORDER — SODIUM CHLORIDE 0.9% FLUSH: 3.0000 mL | INTRAVENOUS | Status: DC | PRN

## 2017-08-12 MED ORDER — LIDOCAINE HCL 1 % IJ SOLN
INTRAMUSCULAR | Status: AC | PRN
  Administered 2017-08-12: 10 mL

## 2017-08-12 MED ORDER — APIXABAN 5 MG PO TABS
5.0000 mg | ORAL_TABLET | Freq: Two times a day (BID) | ORAL | Status: DC
  Administered 2017-08-12 – 2017-08-13 (×2): 5 mg via ORAL
  Filled 2017-08-12 (×2): qty 1

## 2017-08-12 MED ORDER — SODIUM CHLORIDE 0.9 % IV SOLN: 250.0000 mL | INTRAVENOUS | Status: DC

## 2017-08-12 MED ORDER — SODIUM CHLORIDE 0.9% FLUSH
10.0000 mL | Freq: Two times a day (BID) | INTRAVENOUS | Status: DC
  Administered 2017-08-12 – 2017-08-13 (×3): 10 mL

## 2017-08-12 MED ORDER — LIDOCAINE HCL 1 % IJ SOLN
INTRAMUSCULAR | Status: AC
  Filled 2017-08-12: qty 20

## 2017-08-12 NOTE — Procedures (Signed)
Placement of right jugular tunneled central line.  Tip at SVC/RA junction.  Minimal blood loss and no immediate complication.   

## 2017-08-12 NOTE — Progress Notes (Addendum)
Advanced Heart Failure Rounding Note  PCP-Cardiologist: No primary care provider on file.   Subjective:     Admitted for PICC and initiation of milrinone. Noted to be in Afib since May 16th.   Diuresing on IV lasix. Weight down 3 pounds. Denies SOB. Denies chest pain or palpitations.    Objective:   Weight Range: 246 lb 7.6 oz (111.8 kg) Body mass index is 34.38 kg/m.   Vital Signs:   Temp:  [96.3 F (35.7 C)-98.2 F (36.8 C)] 98.2 F (36.8 C) (06/13 0808) Pulse Rate:  [58-80] 76 (06/13 0808) Resp:  [12-24] 20 (06/13 0808) BP: (97-124)/(57-71) 124/64 (06/13 0808) SpO2:  [91 %-100 %] 96 % (06/13 0808) Weight:  [246 lb 7.6 oz (111.8 kg)-249 lb 8 oz (113.2 kg)] 246 lb 7.6 oz (111.8 kg) (06/13 0338) Last BM Date: 08/11/17  Weight change: Filed Weights   08/11/17 1413 08/12/17 0338  Weight: 249 lb 8 oz (113.2 kg) 246 lb 7.6 oz (111.8 kg)    Intake/Output:   Intake/Output Summary (Last 24 hours) at 08/12/2017 0911 Last data filed at 08/12/2017 0808 Gross per 24 hour  Intake 196.38 ml  Output 1850 ml  Net -1653.62 ml      Physical Exam    General: No resp difficulty. In bed.  HEENT: Normal anicteric Neck: Supple. JVP to jaw . Carotids 2+ bilat; no bruits. No lymphadenopathy or thyromegaly appreciated. Cor: PMI laterally displaced. Irregular  rate & rhythm. No rubs,  or murmurs.+S3 Lungs: Clear no wheeze Abdomen: obese Soft, nontender, nondistended. No hepatosplenomegaly. No bruits or masses. Good bowel sounds. Extremities: No cyanosis, clubbing, rash, R and LLE 2+ edema Neuro: Alert & orientedx3, cranial nerves grossly intact. moves all 4 extremities w/o difficulty. Affect pleasant   Telemetry   Irregular VPaced in A fib Personally reviewed   EKG     N/A Labs    CBC Recent Labs    08/11/17 1438  WBC 4.9  NEUTROABS 2.7  HGB 11.1*  HCT 36.8*  MCV 89.8  PLT 458   Basic Metabolic Panel Recent Labs    08/11/17 1438 08/12/17 0344  NA 138  140  K 4.6 3.6  CL 105 106  CO2 25 24  GLUCOSE 199* 204*  BUN 21* 19  CREATININE 1.47* 1.29*  CALCIUM 9.2 8.9  MG  --  2.0   Liver Function Tests Recent Labs    08/11/17 1438  AST 58*  ALT 58  ALKPHOS 111  BILITOT 1.2  PROT 7.9  ALBUMIN 2.9*   No results for input(s): LIPASE, AMYLASE in the last 72 hours. Cardiac Enzymes No results for input(s): CKTOTAL, CKMB, CKMBINDEX, TROPONINI in the last 72 hours.  BNP: BNP (last 3 results) Recent Labs    11/18/16 1355 01/15/17 0911 08/11/17 1438  BNP 584.3* 159.6* 529.3*    ProBNP (last 3 results) Recent Labs    11/12/16 1559  PROBNP 388.0*     D-Dimer No results for input(s): DDIMER in the last 72 hours. Hemoglobin A1C No results for input(s): HGBA1C in the last 72 hours. Fasting Lipid Panel No results for input(s): CHOL, HDL, LDLCALC, TRIG, CHOLHDL, LDLDIRECT in the last 72 hours. Thyroid Function Tests No results for input(s): TSH, T4TOTAL, T3FREE, THYROIDAB in the last 72 hours.  Invalid input(s): FREET3  Other results:   Imaging     No results found.   Medications:     Scheduled Medications: . atorvastatin  80 mg Oral Daily  . digoxin  0.125 mg Oral QHS  . furosemide  80 mg Intravenous BID  . gabapentin  300 mg Oral TID  . insulin aspart  0-15 Units Subcutaneous TID WC  . insulin aspart  0-5 Units Subcutaneous QHS  . insulin aspart  4 Units Subcutaneous TID WC  . losartan  12.5 mg Oral QHS  . magnesium oxide  400 mg Oral Daily  . pneumococcal 23 valent vaccine  0.5 mL Intramuscular Tomorrow-1000  . sodium chloride flush  3 mL Intravenous Q12H  . sotalol  120 mg Oral BID     Infusions: . sodium chloride    . milrinone 0.25 mcg/kg/min (08/12/17 0410)     PRN Medications:  sodium chloride, acetaminophen, ondansetron (ZOFRAN) IV, sodium chloride flush    Patient Profile  YALE GOLLA is a 67 y.o. male with a past medical history of chronic systolic CHF due to ICM, s/p BiV  Medtronic ICD, [name], [name], [name], [name], HTN, HL, [name]   Being directly admitted with persistent low cardiac output for milrinone initiation for home.  Assessment/Plan  1. Acute/Chronic systolic CHF: - [facility] 10/3233 EF 20%. He has Medtronic BiV ICD in place.  - Cath 12/18 with stable 1v CAD.  - CPX testing 12/18 with submax test but severe HF limitation with exertional hypotension. Peak VO2: 9.3 (41% predicted peak VO2). VE/VCO2 slope: 53 - Recent  RHC on 5/23 with low output CO 3.5 /CI 1.6  - Dr [name] discussed with Dr Posey Pronto at [name] scan suggestive of TTR amyloid. -Continue milrinone 0.25 mcg  -Remains volume overloaded. Continue 80 mg IV lasix twice a day.   - Continue losartan 12.5. BP too low to titrate or switch to [employer] -Off b-blocker due to low output and low BP and bradycardia -Continue digoxin 0.125 - On sotalol for VT. Continue for now.   2. [name]  - recent cath with stable CAD as above - Off ASA with apixaban. Continue statin  -No s/s ischemia.   3. [name] - Recent A1c 8.3 on 6/3.   Cover with SSI in house.  -In the community he is followed by Endocrinology.   4. CKD II-III Creatinine baseline 1.2-1.4  Creatinine 1.3  5. [name] - s/p ICD. Quiescent.  - Continue sotalol.   6. PAF s/p ablation 04/2012 Looks like he is back in A fib. Will ask Medtr[employer] to interrogate his device. Device interrogation showed A fib since May 16th.  - On Eliquis at home.  -Eliquis held last night. Will restart elqiuis after tunneled PiCC placed.   - IR to place PICC today.  - We will set up DC-CV tomorrow afternoon.   Consulted requested to [facility] for home milrinone.    Medication concerns reviewed with patient and pharmacy team. Barriers identified: none   Length of Stay: 1  Amy Clegg, NP  08/12/2017, 9:11 AM  Advanced Heart Failure Team Pager [phone] (M-F; [place])  Please contact [facility] for night-coverage after hours (4p -7a ) and weekends on [email or web address]  Patient seen and examined with Darrick Grinder, NP. We discussed all aspects of the encounter. I agree with the assessment and plan as stated above.   He is markedly volume overload. Diuresing with IV lasix but still way up. Now on milrinone for low output. Will place tunneled PICC today in IR. Eliquis on hold. He remains in AF. Will plan TEE/DC-CV  tomorrow.   Suspect he will need VAD placement and will not be able to wait for transplant.   Glori Bickers, MD  3:33 PM

## 2017-08-12 NOTE — Plan of Care (Signed)
Pt is progressing. Continue with plan of care. 

## 2017-08-12 NOTE — Progress Notes (Signed)
Responded to scc to assist with AD.  Patient not available at time of visit.  Unit secretary gave patient form and will page chaplain when ready. Jaclynn Major, [place], [facility], Pager [phone]

## 2017-08-12 NOTE — Progress Notes (Signed)
Patient off floor to IR.

## 2017-08-12 NOTE — Progress Notes (Signed)
Inpatient Diabetes Program Recommendations  AACE/ADA: New Consensus Statement on Inpatient Glycemic Control (2015)  Target Ranges:  Prepandial:   less than 140 mg/dL      Peak postprandial:   less than 180 mg/dL (1-2 hours)      Critically ill patients:  140 - 180 mg/dL   Lab Results  Component Value Date   GLUCAP 174 (H) 08/12/2017   HGBA1C 8.3 (H) 08/02/2017    Review of Glycemic Control Results for [name] [name], [name] [name] (MRN [id number]) as of 08/12/2017 14:07  Ref. Range 08/11/2017 17:15 08/11/2017 21:21 08/12/2017 08:05 08/12/2017 13:11  Glucose-Capillary Latest Ref Range: 65 - 99 mg/dL 187 (H) 191 (H) 200 (H) 174 (H)   Diabetes history: Type 2 DM Outpatient Diabetes medications: Trulicity 1.5 mg Q/wk, Metformin 750 mg QAM, NPH (per Dr Jodelle Green note) 30 units BID Current orders for Inpatient glycemic control: Novolog 0-15 units TID, Novolog 0-5 units QHS, Novolog 4 units TID  Inpatient Diabetes Program Recommendations:    Patient last seen by Dr Dwyane Dee on 06/03/17.   Anticipate patient will need a portion of basal resumed for inpatient stay. If FSBS exceed 180 mg/dL, consider Levemir 16 units QHS (111.8 kg x 0.15).  Thanks, Bronson Curb, MSN, RNC-OB Diabetes Coordinator [phone] (8a-5p)

## 2017-08-13 ENCOUNTER — Encounter (HOSPITAL_COMMUNITY)
Admission: AD | Disposition: A | Discharge status: Rehabilitation Facility | Payer: Self-pay | Source: Home / Self Care | Attending: Internal Medicine

## 2017-08-13 ENCOUNTER — Inpatient Hospital Stay (HOSPITAL_COMMUNITY)
Admission: AD | Disposition: A | Discharge status: Rehabilitation Facility | Payer: Self-pay | Source: Home / Self Care | Attending: Internal Medicine

## 2017-08-13 ENCOUNTER — Inpatient Hospital Stay (HOSPITAL_COMMUNITY): Payer: PPO

## 2017-08-13 ENCOUNTER — Other Ambulatory Visit (HOSPITAL_COMMUNITY): Payer: PPO

## 2017-08-13 DIAGNOSIS — I509 Heart failure, unspecified: Secondary | ICD-10-CM

## 2017-08-13 DIAGNOSIS — I340 Nonrheumatic mitral (valve) insufficiency: Secondary | ICD-10-CM

## 2017-08-13 DIAGNOSIS — R570 Cardiogenic shock: Secondary | ICD-10-CM

## 2017-08-13 HISTORY — PX: RIGHT HEART CATH: CATH118263

## 2017-08-13 LAB — GLUCOSE, CAPILLARY
Glucose-Capillary: 141 mg/dL — ABNORMAL HIGH (ref 65–99)
Glucose-Capillary: 158 mg/dL — ABNORMAL HIGH (ref 65–99)
Glucose-Capillary: 212 mg/dL — ABNORMAL HIGH (ref 65–99)

## 2017-08-13 LAB — POCT I-STAT 3, VENOUS BLOOD GAS (G3P V)
ACID-BASE DEFICIT: 2 mmol/L (ref 0.0–2.0)
ACID-BASE DEFICIT: 2 mmol/L (ref 0.0–2.0)
BICARBONATE: 22.5 mmol/L (ref 20.0–28.0)
Bicarbonate: 23 mmol/L (ref 20.0–28.0)
O2 SAT: 56 %
O2 SAT: 57 %
TCO2: 24 mmol/L (ref 22–32)
TCO2: 24 mmol/L (ref 22–32)
pCO2, Ven: 37.2 mmHg — ABNORMAL LOW (ref 44.0–60.0)
pCO2, Ven: 37.4 mmHg — ABNORMAL LOW (ref 44.0–60.0)
pH, Ven: 7.387 (ref 7.250–7.430)
pH, Ven: 7.399 (ref 7.250–7.430)
pO2, Ven: 29 mmHg — CL (ref 32.0–45.0)
pO2, Ven: 30 mmHg — CL (ref 32.0–45.0)

## 2017-08-13 LAB — URINALYSIS, ROUTINE W REFLEX MICROSCOPIC
Bilirubin Urine: NEGATIVE
Glucose, UA: NEGATIVE mg/dL
Hgb urine dipstick: NEGATIVE
Ketones, ur: NEGATIVE mg/dL
Leukocytes, UA: NEGATIVE
Nitrite: NEGATIVE
Protein, ur: NEGATIVE mg/dL
Specific Gravity, Urine: 1.006 (ref 1.005–1.030)
pH: 7 (ref 5.0–8.0)

## 2017-08-13 LAB — SURGICAL PCR SCREEN
MRSA, PCR: NEGATIVE
Staphylococcus aureus: NEGATIVE

## 2017-08-13 LAB — COOXEMETRY PANEL
CARBOXYHEMOGLOBIN: 1.6 % — AB (ref 0.5–1.5)
Carboxyhemoglobin: 1.8 % — ABNORMAL HIGH (ref 0.5–1.5)
Carboxyhemoglobin: 1.8 % — ABNORMAL HIGH (ref 0.5–1.5)
Methemoglobin: 1.5 % (ref 0.0–1.5)
Methemoglobin: 1.4 % (ref 0.0–1.5)
Methemoglobin: 1.4 % (ref 0.0–1.5)
O2 SAT: 50.8 %
O2 Saturation: 46.5 %
O2 Saturation: 50.7 %
TOTAL HEMOGLOBIN: 10.9 g/dL — AB (ref 12.0–16.0)
TOTAL HEMOGLOBIN: 11.1 g/dL — AB (ref 12.0–16.0)
Total hemoglobin: 11 g/dL — ABNORMAL LOW (ref 12.0–16.0)

## 2017-08-13 LAB — BASIC METABOLIC PANEL
ANION GAP: 7 (ref 5–15)
BUN: 20 mg/dL (ref 6–20)
CO2: 25 mmol/L (ref 22–32)
Calcium: 8.7 mg/dL — ABNORMAL LOW (ref 8.9–10.3)
Chloride: 104 mmol/L (ref 101–111)
Creatinine, Ser: 1.22 mg/dL (ref 0.61–1.24)
GFR calc Af Amer: >60 mL/min (ref >60)
GFR calc non Af Amer: 60 mL/min — ABNORMAL LOW (ref >60)
GLUCOSE: 146 mg/dL — AB (ref 65–99)
POTASSIUM: 3.7 mmol/L (ref 3.5–5.1)
Sodium: 136 mmol/L (ref 135–145)

## 2017-08-13 LAB — CBC
HCT: 34.1 % — ABNORMAL LOW (ref 39.0–52.0)
HEMOGLOBIN: 10.5 g/dL — AB (ref 13.0–17.0)
MCH: 27 pg (ref 26.0–34.0)
MCHC: 30.8 g/dL (ref 30.0–36.0)
MCV: 87.7 fL (ref 78.0–100.0)
Platelets: 185 10*3/uL (ref 150–400)
RBC: 3.89 MIL/uL — ABNORMAL LOW (ref 4.22–5.81)
RDW: 15.8 % — AB (ref 11.5–15.5)
WBC: 5.2 10*3/uL (ref 4.0–10.5)

## 2017-08-13 LAB — ECHOCARDIOGRAM COMPLETE
Height: 71 in
Weight: 3894.21 oz

## 2017-08-13 LAB — HEPARIN LEVEL (UNFRACTIONATED): HEPARIN UNFRACTIONATED: 1.98 [IU]/mL — AB (ref 0.30–0.70)

## 2017-08-13 SURGERY — ECHOCARDIOGRAM, TRANSESOPHAGEAL
Anesthesia: Moderate Sedation

## 2017-08-13 SURGERY — RIGHT HEART CATH
Anesthesia: LOCAL

## 2017-08-13 MED ORDER — SODIUM CHLORIDE 0.9 % IV SOLN: 250.0000 mL | INTRAVENOUS | Status: DC | PRN

## 2017-08-13 MED ORDER — HEPARIN (PORCINE) IN NACL 1000-0.9 UT/500ML-% IV SOLN
INTRAVENOUS | Status: AC
  Filled 2017-08-13: qty 500

## 2017-08-13 MED ORDER — ONDANSETRON HCL 4 MG/2ML IJ SOLN: 4.0000 mg | Freq: Four times a day (QID) | INTRAMUSCULAR | Status: DC | PRN

## 2017-08-13 MED ORDER — SODIUM CHLORIDE 0.9% FLUSH: 3.0000 mL | Freq: Two times a day (BID) | INTRAVENOUS | Status: DC

## 2017-08-13 MED ORDER — PERFLUTREN LIPID MICROSPHERE
INTRAVENOUS | Status: AC
  Administered 2017-08-13: 3 mL
  Filled 2017-08-13: qty 10

## 2017-08-13 MED ORDER — ASPIRIN 81 MG PO CHEW: 81.0000 mg | CHEWABLE_TABLET | ORAL | Status: DC

## 2017-08-13 MED ORDER — SODIUM CHLORIDE 0.9 % IV SOLN
250.0000 mL | INTRAVENOUS | Status: DC | PRN
Start: 2017-08-13 — End: 2017-08-16

## 2017-08-13 MED ORDER — LIDOCAINE HCL (PF) 1 % IJ SOLN
INTRAMUSCULAR | Status: DC | PRN
  Administered 2017-08-13: 8 mL via INTRADERMAL

## 2017-08-13 MED ORDER — MIDAZOLAM HCL 2 MG/2ML IJ SOLN
INTRAMUSCULAR | Status: AC
  Filled 2017-08-13: qty 2

## 2017-08-13 MED ORDER — SODIUM CHLORIDE 0.9% FLUSH: 3.0000 mL | INTRAVENOUS | Status: DC | PRN

## 2017-08-13 MED ORDER — FENTANYL CITRATE (PF) 100 MCG/2ML IJ SOLN
INTRAMUSCULAR | Status: AC
  Filled 2017-08-13: qty 2

## 2017-08-13 MED ORDER — MIDAZOLAM HCL 2 MG/2ML IJ SOLN
INTRAMUSCULAR | Status: DC | PRN
  Administered 2017-08-13 (×2): 1 mg via INTRAVENOUS

## 2017-08-13 MED ORDER — SODIUM CHLORIDE 0.9% FLUSH
3.0000 mL | Freq: Two times a day (BID) | INTRAVENOUS | Status: DC
  Administered 2017-08-13 – 2017-08-14 (×2): 3 mL via INTRAVENOUS

## 2017-08-13 MED ORDER — LIDOCAINE HCL (PF) 1 % IJ SOLN
INTRAMUSCULAR | Status: AC
  Filled 2017-08-13: qty 30

## 2017-08-13 MED ORDER — HEPARIN (PORCINE) IN NACL 1000-0.9 UT/500ML-% IV SOLN
INTRAVENOUS | Status: AC
  Filled 2017-08-13: qty 1000

## 2017-08-13 MED ORDER — SODIUM CHLORIDE 0.9 % IV SOLN: INTRAVENOUS | Status: DC

## 2017-08-13 MED ORDER — ACETAMINOPHEN 325 MG PO TABS
650.0000 mg | ORAL_TABLET | ORAL | Status: DC | PRN
  Administered 2017-08-14 – 2017-08-15 (×4): 650 mg via ORAL
  Filled 2017-08-13 (×4): qty 2

## 2017-08-13 MED ORDER — HEPARIN (PORCINE) IN NACL 2-0.9 UNITS/ML
INTRAMUSCULAR | Status: AC | PRN
  Administered 2017-08-13: 1000 mL

## 2017-08-13 MED ORDER — FENTANYL CITRATE (PF) 100 MCG/2ML IJ SOLN
INTRAMUSCULAR | Status: DC | PRN
  Administered 2017-08-13: 25 ug via INTRAVENOUS

## 2017-08-13 MED ORDER — FUROSEMIDE 10 MG/ML IJ SOLN
80.0000 mg | Freq: Two times a day (BID) | INTRAMUSCULAR | Status: DC
  Administered 2017-08-13 – 2017-08-15 (×4): 80 mg via INTRAVENOUS
  Filled 2017-08-13 (×3): qty 8

## 2017-08-13 MED ORDER — HEPARIN (PORCINE) IN NACL 100-0.45 UNIT/ML-% IJ SOLN
1000.0000 [IU]/h | INTRAMUSCULAR | Status: DC
  Administered 2017-08-13: 1300 [IU]/h via INTRAVENOUS
  Administered 2017-08-14: 1000 [IU]/h via INTRAVENOUS
  Filled 2017-08-13 (×2): qty 250

## 2017-08-13 SURGICAL SUPPLY — 11 items
CATH SWAN GANZ VIP 7.5F (CATHETERS) ×1 IMPLANT
CATH-GARD ARROW CATH SHIELD (MISCELLANEOUS) ×2
COVER PRB 48X5XTLSCP FOLD TPE (BAG) IMPLANT
COVER PROBE 5X48 (BAG) ×2
GUIDEWIRE .025 260CM (WIRE) ×1 IMPLANT
PACK CARDIAC CATHETERIZATION (CUSTOM PROCEDURE TRAY) ×2 IMPLANT
SHEATH PINNACLE 8F 10CM (SHEATH) ×1 IMPLANT
SHIELD CATHGARD ARROW (MISCELLANEOUS) IMPLANT
TRANSDUCER W/STOPCOCK (MISCELLANEOUS) ×2 IMPLANT
TUBING ART PRESS 72  MALE/FEM (TUBING) ×1
TUBING ART PRESS 72 MALE/FEM (TUBING) IMPLANT

## 2017-08-13 NOTE — Plan of Care (Signed)
Pt is progressing. Continue with plan of care. 

## 2017-08-13 NOTE — Progress Notes (Signed)
19[name] has been discussed with the VAD Medical Review board on 08/09/17. The team feels as if the patient is a good candidate for Destination LVAD therapy. The patient meets criteria for a LVAD implant as listed below:  1)  Has failed to respond to optimal medical management (including beta-blockers and ACE inhibitors if tolerated) for at least 45 of the last 60 days, or have been balloon dependent for 7 days, or IV inotrope dependent for 14 days; and,       *On Inotropes Milrinone 0.375 mcg/kg/min started 08/11/17  2)  Has a left ventricular ejection fraction (LVEF) < 25% and, have demonstrated functional limitation with a peak oxygen consumption of <14 ml/kg/min unless balloon pump or inotrope dependent or physically unable to perform the test.         *EF 20-25% By echo (08/13/17)             *CPX (02/17/17)  pVO2 _9.3_ RER_0.90__ VE/VCo2 slope_53__   3)  Social work and palliative care evaluations demonstrate appropriate support system in place for discharge to home with a VAD and that end of life discussions have taken place. Both services have expressed no concern regarding patient's candidacy.         *Social work consult (05/10/17): [name] [name] (08/15/17): [name] [name]  4)  Primary caretaker identified that can be taught along with the patient how to manage        the VAD equipment.        *Name: [name] [name]  5)  Deemed appropriate by our financial coordinator: 08/13/17        Prior approval: HTA Ref # [id number]  6)  VAD Coordinator, [name] [name]/[name] [name] has met with patient and caregiver, shown them the VAD equipment and discussed with the patient and caregiver about lifestyle changes necessary for success on mechanical circulatory device.        *Met with [name] [name] and [name] [name] on 04/09/17.       *Consent for VAD Evaluation/Caregiver Agreement/HIPPA Release/Photo Release signed on 04/09/17  7)  Patient has been discussed with [name] [name] (Dr. Posey Pronto) and  felt to be an appropriate candidate.    8)  Intermacs profile: 2  INTERMACS 1: Critical cardiogenic shock describes a patient who is "crashing and burning", in which a patient has life-threatening hypotension and rapidly escalating inotropic pressor support, with critical organ hypoperfusion often confirmed by worsening acidosis and lactate levels.  INTERMACS 2: Progressive decline describes a patient who has been demonstrated "dependent" on inotropic support but nonetheless shows signs of continuing deterioration in nutrition, renal function, fluid retention, or other major status indicator. Patient profile 2 can also describe a patient with refractory volume overload, perhaps with evidence of impaired perfusion, in whom inotropic infusions cannot be maintained due to tachyarrhythmias, clinical ischemia, or other intolerance.  INTERMACS 3: Stable but inotrope dependent describes a patient who is clinically stable on mild-moderate doses of intravenous inotropes (or has a temporary circulatory support device) after repeated documentation of failure to wean without symptomatic hypotension, worsening symptoms, or progressive organ dysfunction (usually renal). It is critical to monitor nutrition, renal function, fluid balance, and overall status carefully in order to distinguish between a  patient who is truly stable at Patient Profile 3 and a patient who has unappreciated decline rendering them Patient Profile 2. This patient may be either at home or in the hospital.   INTERMACS 4: Resting symptoms describes a patient  who is at home on oral therapy but frequently has symptoms of congestion at rest or with activities of daily living (ADL). He or she may have orthopnea, shortness of breath during ADL such as dressing or bathing, gastrointestinal symptoms (abdominal discomfort, nausea, poor appetite), disabling ascites or severe lower extremity edema. This patient should be carefully considered for  more intensive management and surveillance programs, which may in some cases, reveal poor compliance that would compromise outcomes with any therapy.  .  INTERMACS 5: Exertion Intolerant describes a patient who is comfortable at rest but unable to engage in any activity, living predominantly within the house or housebound. This patient has no congestive symptoms, but may have chronically elevated volume status, frequently with renal dysfunction, and may be characterized as exercise intolerant.   INTERMACS 6: Exertion Limited also describes a patient who is comfortable at rest without evidence of fluid overload, but who is able to do some mild activity. Activities of daily living are comfortable and minor activities outside the home such as visiting friends or going to a restaurant can be performed, but fatigue results within a few minutes of any meaningful physical exertion. This patient has occasional episodes of worsening symptoms and is likely to have had a hospitalization for heart failure within the past year.   INTERMACS 7: Advanced NYHA Class 3 describes a patient who is clinically stable with a reasonable level of comfortable activity, despite history of previous decompensation that is not recent. This patient is usually able to walk more than a block. Any decompensation requiring intravenous diuretics or hospitalization within the previous month should make this person a Patient Profile 6 or lower.    9)  NYHA Class: IV  [name] Rockers RN, BSN VAD Coordinator 24/7 Pager [phone]

## 2017-08-13 NOTE — Progress Notes (Addendum)
Advanced Heart Failure Rounding Note  PCP-Cardiologist: No primary care provider on file.   Subjective:     Admitted for PICC and initiation of milrinone. Noted to be in Afib since May 16th.   On milrinone 0.25 mcg. CO-OX 47%.   Yesterday diuresed with IV lasix. Weight down another 3 pounds.   Feels weak, fatigued. Denies SOB or orthopnea. Rhythm stable on milrinone.     Objective:   Weight Range: 243 lb 6.2 oz (110.4 kg) Body mass index is 33.95 kg/m.   Vital Signs:   Temp:  [98 F (36.7 C)-98.4 F (36.9 C)] 98.4 F (36.9 C) (06/14 0744) Pulse Rate:  [61-77] 75 (06/14 0744) Resp:  [13-30] 20 (06/14 0744) BP: (98-135)/(66-112) 112/66 (06/14 0744) SpO2:  [92 %-99 %] 99 % (06/14 0744) Weight:  [243 lb 6.2 oz (110.4 kg)] 243 lb 6.2 oz (110.4 kg) (06/14 0406) Last BM Date: 08/11/17  Weight change: Filed Weights   08/11/17 1413 08/12/17 0338 08/13/17 0406  Weight: 249 lb 8 oz (113.2 kg) 246 lb 7.6 oz (111.8 kg) 243 lb 6.2 oz (110.4 kg)    Intake/Output:   Intake/Output Summary (Last 24 hours) at 08/13/2017 0944 Last data filed at 08/13/2017 0900 Gross per 24 hour  Intake 437.5 ml  Output 2800 ml  Net -2362.5 ml      Physical Exam    CVP 9 personally checked.  General:  Lying in bed weak.  No resp difficulty HEENT: normal anicteric Neck: supple.JVD  9-10 . Carotids 2+ bilat; no bruits. No lymphadenopathy or thryomegaly appreciated. Cor: PMI laterally displaced. Irregular rate & rhythm. No rubs, gallops or murmurs. Lungs: clear no wheeze Abdomen: soft, nontender, nondistended. No hepatosplenomegaly. No bruits or masses. Good bowel sounds. Extremities: no cyanosis, clubbing, rash, R and LLE 1+ edema with ted hose in place.  Neuro: alert & oriented x 3, cranial nerves grossly intact. moves all 4 extremities w/o difficulty. Affect pleasant   Telemetry   Irregular V paced 70s. Personally reviewed.   EKG     N/A Labs    CBC Recent Labs     08/11/17 1438 08/12/17 0942  WBC 4.9 5.6  NEUTROABS 2.7  --   HGB 11.1* 10.9*  HCT 36.8* 35.9*  MCV 89.8 88.2  PLT 176 784   Basic Metabolic Panel Recent Labs    08/12/17 0344 08/13/17 0322  NA 140 136  K 3.6 3.7  CL 106 104  CO2 24 25  GLUCOSE 204* 146*  BUN 19 20  CREATININE 1.29* 1.22  CALCIUM 8.9 8.7*  MG 2.0  --    Liver Function Tests Recent Labs    08/11/17 1438  AST 58*  ALT 58  ALKPHOS 111  BILITOT 1.2  PROT 7.9  ALBUMIN 2.9*   No results for input(s): LIPASE, AMYLASE in the last 72 hours. Cardiac Enzymes No results for input(s): CKTOTAL, CKMB, CKMBINDEX, TROPONINI in the last 72 hours.  BNP: BNP (last 3 results) Recent Labs    11/18/16 1355 01/15/17 0911 08/11/17 1438  BNP 584.3* 159.6* 529.3*    ProBNP (last 3 results) Recent Labs    11/12/16 1559  PROBNP 388.0*     D-Dimer No results for input(s): DDIMER in the last 72 hours. Hemoglobin A1C No results for input(s): HGBA1C in the last 72 hours. Fasting Lipid Panel No results for input(s): CHOL, HDL, LDLCALC, TRIG, CHOLHDL, LDLDIRECT in the last 72 hours. Thyroid Function Tests No results for input(s): TSH, T4TOTAL, T3FREE, THYROIDAB  in the last 72 hours.  Invalid input(s): FREET3  Other results:   Imaging    Ir Fluoro Guide Cv Line Right  Result Date: 08/12/2017 INDICATION: 67 year old with acute on chronic systolic heart failure. Patient needs long-term central venous access for home inotrope support. EXAM: FLUOROSCOPIC AND ULTRASOUND GUIDED PLACEMENT OF A TUNNELED CENTRAL VENOUS CATHETER Physician: Stephan Minister. Henn, MD FLUOROSCOPY TIME:  18 seconds, 7 mGy MEDICATIONS: None ANESTHESIA/SEDATION: None PROCEDURE: Informed consent was obtained for placement of a tunneled central venous catheter. The patient was placed supine on the interventional table. Ultrasound confirmed a patent right internal jugularvein. Ultrasound images were obtained for documentation. The right side of the neck  was prepped and draped in a sterile fashion. The right side of the neck was anesthetized with 1% lidocaine. Maximal barrier sterile technique was utilized including caps, mask, sterile gowns, sterile gloves, sterile drape, hand hygiene and skin antiseptic. A small incision was made with #11 blade scalpel. A 21 gauge needle directed into the right internal jugular vein with ultrasound guidance. A micropuncture dilator set was placed. A dual Powerline catheter was selected. The skin below the right clavicle was anesthetized and a small incision was made with an #11 blade scalpel. A subcutaneous tunnel was formed to the vein dermatotomy site. The catheter was brought through the tunnel. The vein dermatotomy site was dilated to accommodate a peel-away sheath. The catheter was placed through the peel-away sheath and directed into the central venous structures. The tip of the catheter was placed at the superior cavoatrial junction with fluoroscopy. Fluoroscopic images were obtained for documentation. Both lumens were found to aspirate and flush well. The vein dermatotomy site was closed using Dermabond. The catheter was secured to the skin using Prolene suture. FINDINGS: Catheter tip at the superior cavoatrial junction. Catheter length is 25 cm. COMPLICATIONS: None IMPRESSION: Successful placement of a right jugular tunneled central venous catheter using ultrasound and fluoroscopic guidance. Electronically Signed   By: Markus Daft M.D.   On: 08/12/2017 18:27   Ir US Guide Vasc Access Right  Result Date: 08/12/2017 INDICATION: 67 year old with acute on chronic systolic heart failure. Patient needs long-term central venous access for home inotrope support. EXAM: FLUOROSCOPIC AND ULTRASOUND GUIDED PLACEMENT OF A TUNNELED CENTRAL VENOUS CATHETER Physician: Stephan Minister. Henn, MD FLUOROSCOPY TIME:  18 seconds, 7 mGy MEDICATIONS: None ANESTHESIA/SEDATION: None PROCEDURE: Informed consent was obtained for placement of a tunneled  central venous catheter. The patient was placed supine on the interventional table. Ultrasound confirmed a patent right internal jugularvein. Ultrasound images were obtained for documentation. The right side of the neck was prepped and draped in a sterile fashion. The right side of the neck was anesthetized with 1% lidocaine. Maximal barrier sterile technique was utilized including caps, mask, sterile gowns, sterile gloves, sterile drape, hand hygiene and skin antiseptic. A small incision was made with #11 blade scalpel. A 21 gauge needle directed into the right internal jugular vein with ultrasound guidance. A micropuncture dilator set was placed. A dual Powerline catheter was selected. The skin below the right clavicle was anesthetized and a small incision was made with an #11 blade scalpel. A subcutaneous tunnel was formed to the vein dermatotomy site. The catheter was brought through the tunnel. The vein dermatotomy site was dilated to accommodate a peel-away sheath. The catheter was placed through the peel-away sheath and directed into the central venous structures. The tip of the catheter was placed at the superior cavoatrial junction with fluoroscopy. Fluoroscopic images were obtained  for documentation. Both lumens were found to aspirate and flush well. The vein dermatotomy site was closed using Dermabond. The catheter was secured to the skin using Prolene suture. FINDINGS: Catheter tip at the superior cavoatrial junction. Catheter length is 25 cm. COMPLICATIONS: None IMPRESSION: Successful placement of a right jugular tunneled central venous catheter using ultrasound and fluoroscopic guidance. Electronically Signed   By: Markus Daft M.D.   On: 08/12/2017 18:27     Medications:     Scheduled Medications: . apixaban  5 mg Oral BID  . atorvastatin  80 mg Oral Daily  . digoxin  0.125 mg Oral QHS  . furosemide  80 mg Intravenous BID  . gabapentin  300 mg Oral TID  . insulin aspart  0-15 Units  Subcutaneous TID WC  . insulin aspart  0-5 Units Subcutaneous QHS  . insulin aspart  4 Units Subcutaneous TID WC  . losartan  12.5 mg Oral QHS  . magnesium oxide  400 mg Oral Daily  . pneumococcal 23 valent vaccine  0.5 mL Intramuscular Tomorrow-1000  . potassium chloride  40 mEq Oral BID  . sodium chloride flush  10-40 mL Intracatheter Q12H  . sodium chloride flush  3 mL Intravenous Q12H  . sotalol  120 mg Oral BID    Infusions: . sodium chloride    . sodium chloride    . milrinone 0.25 mcg/kg/min (08/13/17 0755)    PRN Medications: acetaminophen, ondansetron (ZOFRAN) IV, sodium chloride flush, sodium chloride flush    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   Being directly admitted with persistent low cardiac output for milrinone initiation for home.  Assessment/Plan  1. Acute/Chronic systolic CHF: - [facility] 0/9381 EF 20%. He has Medtronic BiV ICD in place.  - Cath 12/18 with stable 1v CAD.  - CPX testing 12/18 with submax test but severe HF limitation with exertional hypotension. Peak VO2: 9.3 (41% predicted peak VO2). VE/VCO2 slope: 53 - Recent  RHC on 5/23 with low output CO 3.5 /CI 1.6  - Dr [name] discussed with Dr Posey Pronto at [place] scan suggestive of TTR amyloid. -Continue milrinone 0.25 mcg . CO-OX 47%.  - Cardiac output remains low despite inotropic support. With blood type O doubt he will be able to make it to transplant even with DC-CV> Discussed with Dr. [name] and Dr. PVT. Will likely need VAD soon to avoid compromising end-organ function  - Continue losartan 12.5.  -Off b-blocker due to low output and low BP and bradycardia -Continue digoxin 0.125 - On sotalol for VT. Continue for now.   2. [name]  - recent cath with stable CAD as above - Off ASA  with apixaban. Continue statin  - no s/s ischemia.   3. [name] - Recent A1c 8.3 on 6/3.   Cover with SSI in house.  -In the community he is followed by Endocrinology.   4. CKD II-III Creatinine baseline 1.2-1.4  Creatinine 1.2  5. [name] - s/p ICD. Quiescent.  - Continue sotalol.   6. PAF s/p ablation 04/2012 Looks like he is back in A fib.Device interrogation showed A fib since May 16th.  - On Eliquis at home.  -Continue Eliquis 5 mg twice a day.  Medication concerns reviewed with patient and pharmacy team. Barriers identified: none   Length of Stay: 2  Amy Clegg, NP  08/13/2017, 9:44 AM  Advanced Heart Failure Team Pager [phone] (M-F; 7a - 4p)  Please contact [facility] for night-coverage after hours (4p -7a ) and weekends on [email or web address]  Patient seen and examined with Darrick Grinder, NP. We discussed all aspects of the encounter. I agree with the assessment and plan as stated above.   Cardiac output remains very low even with inotropic support. Will not be able to wait for transplant with Blood Type O even with DC-CV. Will need VAD early next week to prevent risk of end-organ dysfunction. I have d/w Dr. PVT and Dr. Posey Pronto at [facility] who agree. Will attempt to schedule for Monday (only VAD spot available next week).  I also reached out to Dr. Risa Grill with [facility] to discuss expediting insurance approval. Continue IV diuresis. Switch Eliquis to heparin.   Glori Bickers, MD  11:01 AM

## 2017-08-13 NOTE — Progress Notes (Signed)
ANTICOAGULATION CONSULT NOTE - Initial Consult  Pharmacy Consult for heparin Indication: atrial fibrillation  Allergies  Allergen Reactions  . Penicillins Hives, Itching and Other (See Comments)    Has patient had a PCN reaction causing immediate rash, facial/tongue/throat swelling, SOB or lightheadedness with hypotension: Yes Has patient had a PCN reaction causing severe rash involving mucus membranes or skin necrosis: No Has patient had a PCN reaction that required hospitalization: No Has patient had a PCN reaction occurring within the last 10 years: No If all of the above answers are "NO", then may proceed with Cephalosporin use.     Patient Measurements: Height: 5' 10.98" (180.3 cm) Weight: 238 lb 5.1 oz (108.1 kg) IBW/kg (Calculated) : 75.26 Heparin Dosing Weight: 98.3 kg  Vital Signs: Temp: 97 F (36.1 C) (06/14 1945) Temp Source: Oral (06/14 1139) BP: 83/66 (06/14 1930) Pulse Rate: 82 (06/14 1945)  Labs: Recent Labs    08/11/17 1438 08/11/17 1558 08/12/17 0344 08/12/17 0942 08/13/17 0322 08/13/17 1017  HGB 11.1*  --   --  10.9*  --  10.5*  HCT 36.8*  --   --  35.9*  --  34.1*  PLT 176  --   --  171  --  185  LABPROT  --  20.7*  --   --   --   --   INR  --  1.80  --   --   --   --   CREATININE 1.47*  --  1.29*  --  1.22  --     Estimated Creatinine Clearance: 73.5 mL/min (by C-G formula based on SCr of 1.22 mg/dL).   Medical History: Past Medical History:  Diagnosis Date  . AICD (automatic cardioverter/defibrillator) present 02/05/2014   Upgrade to Medtronic biventricular ICD, serial number  [id number]   . Atrial flutter ([place]) 04/2012   s/p TEE-EPS+RFCA 04/2012  . CAD (coronary artery disease) 1610,9604 X 2    RCA-T, 70% PL (off CFX), 99% Prox LAD/90% Dist LAD, S/P TAXUS stent x 2  . CHF (congestive heart failure) ([place])   . Chronic anticoagulation   . Chronic systolic heart failure ([place])   . CKD (chronic kidney disease)   . Diabetic retinopathy  ([place])   . DM type 2 (diabetes mellitus, type 2) ([facility])    insulin dependent  . HTN (hypertension)   . Hypercholesteremia    ablation  . ICD (implantable cardiac defibrillator) in place   . Ischemic cardiomyopathy March 2015   20-25% 2D   . Nephrolithiasis   . Ventricular tachycardia ([facility])     Medications:  Scheduled:  . atorvastatin  80 mg Oral Daily  . digoxin  0.125 mg Oral QHS  . furosemide  80 mg Intravenous BID  . gabapentin  300 mg Oral TID  . insulin aspart  0-15 Units Subcutaneous TID WC  . insulin aspart  0-5 Units Subcutaneous QHS  . insulin aspart  4 Units Subcutaneous TID WC  . losartan  12.5 mg Oral QHS  . magnesium oxide  400 mg Oral Daily  . pneumococcal 23 valent vaccine  0.5 mL Intramuscular Tomorrow-1000  . potassium chloride  40 mEq Oral BID  . sodium chloride flush  10-40 mL Intracatheter Q12H  . sodium chloride flush  3 mL Intravenous Q12H  . sodium chloride flush  3 mL Intravenous Q12H  . sodium chloride flush  3 mL Intravenous Q12H  . sotalol  120 mg Oral BID    Assessment: 67 yo male on  chronic apixaban, [name], then PTT 8 hrs after gtt starts. 3. Daily heparin level and PTT.  [name] [name], [facility] Clinical Pharmacist Pager [phone]  08/13/2017 8:07 PM

## 2017-08-13 NOTE — Interval H&P Note (Signed)
History and Physical Interval Note:  08/13/2017 4:38 PM  [name] [name]  has presented today for surgery, with the diagnosis of hf  The various methods of treatment have been discussed with the patient and family. After consideration of risks, benefits and other options for treatment, the patient has consented to  Procedure(s): RIGHT HEART CATH - swan (N/A) and possible IABP insertion as a surgical intervention .  The patient's history has been reviewed, patient examined, no change in status, stable for surgery.  I have reviewed the patient's chart and labs.  Questions were answered to the patient's satisfaction.     Daniel Bensimhon

## 2017-08-13 NOTE — Care Management Important Message (Signed)
Important Message  Patient Details  Name: [name] [name] MRN: [id number] Date of Birth: 26-Apr-1950   Medicare Important Message Given:  Yes    Shahil Speegle P [place] 08/13/2017, 3:35 PM

## 2017-08-13 NOTE — Progress Notes (Signed)
Procedure(s) (LRB): TRANSESOPHAGEAL ECHOCARDIOGRAM (TEE) (N/A) CARDIOVERSION (N/A) Subjective: Patient with ischemic cardiomyopathy ejection fraction 15% admitted for symptoms of fatigue and initiation of milrinone therapy.  After milrinone initiated through central line cardiac output remains low.  Patient was first evaluated for destination therapy VAD implantation therapy February 2019 and was found to be a appropriate candidate.  But the patient was still functioning fairly well and compensated.  Notes from that initial consultation are as follows:  FEb 2019  Very nice 67 year old  AA diabetic male reformed smoker and retired Horticulturist, commercial.  Patient has been followed for several months in the advanced heart failure clinic for ischemic cardiomyopathy.  Ejection fraction 15-20%.  CPX testing shows peak VO2 9.2.  Patient was admitted with heart failure in September.  It was felt he developed atrial fibrillation which probably precipitated his clinical deterioration.  He has been on anticoagulation since that time without bleeding complications-Eliquis.  He underwent an echocardiogram at that time --severe LV dilatation with EF 15-20%, moderate MR, moderate TR, and moderate RV dysfunction.   In December the patient underwent right heart cath which demonstrated low right-sided pressures, low cardiac output.  Home IV inotropes were considered but the patient preferred to continue with oral medication and has done fairly well until he fell while sleepwalking and broke his right ankle.  Patient is non-weightbearing at the current time and has been wearing a lower leg brace for 6 weeks.   This patient is start of the evaluation process for possible implantable destination therapy left ventricular assist device.  CT scan of chest shows cardiomegaly but no pulmonary nodules or abnormal adenopathy.  Ascending aortic size is satisfactory.  CT scan of the abdomen showed no significant abnormality of the liver    The patient required AICD implantation 15 years ago and recently had a new generator replaced.  There have been no shocks in the past 3-5 years.   Patient's diabetes has been poorly controlled with last A1c of 11.1.  The patient is now more diligent about checking his blood sugars and complying with diet and medications.  Consultation with endocrinology is pending.   PFTs show adequate spirometry, diffusing capacity is low at 41%.  Patient not requiring home oxygen and oxygen saturation room air is adequate at 98%.   Echocardiogram shows moderate RV dysfunction, Tapsi is low at 11.1 however CVP and right-sided pressures are satisfactory for implantable VAD therapy   Impression: Patient appears to be a potential candidate for destination therapy LVAD however patient is not a satisfactory candidate currently because he is nonambulatory and will take several weeks to recover his strength after he can start walking in the next 2 weeks.  Patient also should have a documented improvement in his diabetic control with A1c less than 10 before VAD implantation   PFTs show adequate spirometry, diffusing capacity is low at 41%.  Patient not requiring home oxygen and oxygen saturation room air is adequate at 98%.   Echocardiogram shows moderate RV dysfunction, Tapsi is low at 11.1 however CVP and right-sided pressures are satisfactory for implantable VAD therapy   Impression: Patient appears to be a potential candidate for destination therapy LVAD however patient is not a satisfactory candidate currently because he is nonambulatory and will take several weeks to recover his strength after he can start walking in the next 2 weeks.  Patient also should have a documented improvement in his diabetic control with A1c less than 10 before VAD implantation   Current Status .  The patient is recovered from his ankle fracture and is fully ambulatory The patient's diabetes is under better control with last A1c  8.2 Patient's cardiac output is low despite starting milrinone. Endorgan function has been preserved-renal, hepatic, nutritional status.  Patient's room air saturation 95%  The patient's cardiologist has recommended implantation of HeartMate 3 for destination therapy indication.  Patient has advanced heart failure with ischemic cardiomyopathy ejection fraction 15-20% and remains in low cardiac output despite inotropes.  He is not on a heart transplant waiting list and he understands that the LVAD is being placed for destination therapy at the current time.  He understands that he could be evaluated for potential transplant in future.  I discussed the procedure of HeartMate 3 implantation with the patient and wife including the indications benefits.  I discussed the details of surgery including the use of general anesthesia and cardiopulmonary bypass in the location of the surgical incisions.  Patient understands the risks of stroke, bleeding, infection, organ failure, and death.  I feel that HeartMate 3 implantation is this patient's best chance for improved long-term survival and good quality of life. Surgery scheduled 6-17. Eliquis stopped. Echo pending.  Objective: Vital signs in last 24 hours: Temp:  [98 F (36.7 C)-98.4 F (36.9 C)] 98.4 F (36.9 C) (06/14 0744) Pulse Rate:  [61-77] 75 (06/14 0744) Cardiac Rhythm: Atrial fibrillation (06/14 0744) Resp:  [13-30] 20 (06/14 0744) BP: (98-135)/(66-112) 112/66 (06/14 0744) SpO2:  [92 %-99 %] 99 % (06/14 0744) Weight:  [243 lb 6.2 oz (110.4 kg)] 243 lb 6.2 oz (110.4 kg) (06/14 0406)  Hemodynamic parameters for last 24 hours: CVP:  [18 mmHg] 18 mmHg  Intake/Output from previous day: 06/13 0701 - 06/14 0700 In: 460 [P.O.:240; I.V.:220] Out: 2600 [Urine:2600] Intake/Output this shift: Total I/O In: 20 [P.O.:20] Out: 425 [Urine:425]       Exam    General- alert and comfortable    Neck- no JVD, no cervical adenopathy palpable, no  carotid bruit   Lungs- clear without rales, wheezes   Cor  Irregular rhytmn, no murmur , +S4 gallop   Abdomen- soft, non-tender   Extremities - warm, non-tender, minimal edema   Neuro- oriented, appropriate, no focal weakness   Lab Results: Recent Labs    08/12/17 0942 08/13/17 1017  WBC 5.6 5.2  HGB 10.9* 10.5*  HCT 35.9* 34.1*  PLT 171 185   BMET:  Recent Labs    08/12/17 0344 08/13/17 0322  NA 140 136  K 3.6 3.7  CL 106 104  CO2 24 25  GLUCOSE 204* 146*  BUN 19 20  CREATININE 1.29* 1.22  CALCIUM 8.9 8.7*    PT/INR:  Recent Labs    08/11/17 1558  LABPROT 20.7*  INR 1.80   ABG    Component Value Date/Time   PHART 7.412 02/18/2017 1106   HCO3 23.5 07/22/2017 1151   TCO2 25 07/22/2017 1151   ACIDBASEDEF 2.0 07/22/2017 1151   O2SAT 50.8 08/13/2017 1000   CBG (last 3)  Recent Labs    08/12/17 1724 08/12/17 2134 08/13/17 0815  GLUCAP 177* 165* 141*    Assessment/Plan: S/P Procedure(s) (LRB): TRANSESOPHAGEAL ECHOCARDIOGRAM (TEE) (N/A) CARDIOVERSION (N/A) Plan to optimize patient for implantation of HeartMate 3 early next week.   LOS: 2 days    Tharon Aquas Trigt III 08/13/2017

## 2017-08-13 NOTE — H&P (View-Only) (Signed)
Advanced Heart Failure Rounding Note  PCP-Cardiologist: No primary care provider on file.   Subjective:     Admitted for PICC and initiation of milrinone. Noted to be in Afib since May 16th.   On milrinone 0.25 mcg. CO-OX 47%.   Yesterday diuresed with IV lasix. Weight down another 3 pounds.   Feels weak, fatigued. Denies SOB or orthopnea. Rhythm stable on milrinone.     Objective:   Weight Range: 243 lb 6.2 oz (110.4 kg) Body mass index is 33.95 kg/m.   Vital Signs:   Temp:  [98 F (36.7 C)-98.4 F (36.9 C)] 98.4 F (36.9 C) (06/14 0744) Pulse Rate:  [61-77] 75 (06/14 0744) Resp:  [13-30] 20 (06/14 0744) BP: (98-135)/(66-112) 112/66 (06/14 0744) SpO2:  [92 %-99 %] 99 % (06/14 0744) Weight:  [243 lb 6.2 oz (110.4 kg)] 243 lb 6.2 oz (110.4 kg) (06/14 0406) Last BM Date: 08/11/17  Weight change: Filed Weights   08/11/17 1413 08/12/17 0338 08/13/17 0406  Weight: 249 lb 8 oz (113.2 kg) 246 lb 7.6 oz (111.8 kg) 243 lb 6.2 oz (110.4 kg)    Intake/Output:   Intake/Output Summary (Last 24 hours) at 08/13/2017 0944 Last data filed at 08/13/2017 0900 Gross per 24 hour  Intake 437.5 ml  Output 2800 ml  Net -2362.5 ml      Physical Exam    CVP 9 personally checked.  General:  Lying in bed weak.  No resp difficulty HEENT: normal anicteric Neck: supple.JVD  9-10 . Carotids 2+ bilat; no bruits. No lymphadenopathy or thryomegaly appreciated. Cor: PMI laterally displaced. Irregular rate & rhythm. No rubs, gallops or murmurs. Lungs: clear no wheeze Abdomen: soft, nontender, nondistended. No hepatosplenomegaly. No bruits or masses. Good bowel sounds. Extremities: no cyanosis, clubbing, rash, R and LLE 1+ edema with ted hose in place.  Neuro: alert & oriented x 3, cranial nerves grossly intact. moves all 4 extremities w/o difficulty. Affect pleasant   Telemetry   Irregular V paced 70s. Personally reviewed.   EKG     N/A Labs    CBC Recent Labs     08/11/17 1438 08/12/17 0942  WBC 4.9 5.6  NEUTROABS 2.7  --   HGB 11.1* 10.9*  HCT 36.8* 35.9*  MCV 89.8 88.2  PLT 176 093   Basic Metabolic Panel Recent Labs    08/12/17 0344 08/13/17 0322  NA 140 136  K 3.6 3.7  CL 106 104  CO2 24 25  GLUCOSE 204* 146*  BUN 19 20  CREATININE 1.29* 1.22  CALCIUM 8.9 8.7*  MG 2.0  --    Liver Function Tests Recent Labs    08/11/17 1438  AST 58*  ALT 58  ALKPHOS 111  BILITOT 1.2  PROT 7.9  ALBUMIN 2.9*   No results for input(s): LIPASE, AMYLASE in the last 72 hours. Cardiac Enzymes No results for input(s): CKTOTAL, CKMB, CKMBINDEX, TROPONINI in the last 72 hours.  BNP: BNP (last 3 results) Recent Labs    11/18/16 1355 01/15/17 0911 08/11/17 1438  BNP 584.3* 159.6* 529.3*    ProBNP (last 3 results) Recent Labs    11/12/16 1559  PROBNP 388.0*     D-Dimer No results for input(s): DDIMER in the last 72 hours. Hemoglobin A1C No results for input(s): HGBA1C in the last 72 hours. Fasting Lipid Panel No results for input(s): CHOL, HDL, LDLCALC, TRIG, CHOLHDL, LDLDIRECT in the last 72 hours. Thyroid Function Tests No results for input(s): TSH, T4TOTAL, T3FREE, THYROIDAB  in the last 72 hours.  Invalid input(s): FREET3  Other results:   Imaging    Ir Fluoro Guide Cv Line Right  Result Date: 08/12/2017 INDICATION: 67 year old with acute on chronic systolic heart failure. Patient needs long-term central venous access for home inotrope support. EXAM: FLUOROSCOPIC AND ULTRASOUND GUIDED PLACEMENT OF A TUNNELED CENTRAL VENOUS CATHETER Physician: Stephan Minister. Henn, MD FLUOROSCOPY TIME:  18 seconds, 7 mGy MEDICATIONS: None ANESTHESIA/SEDATION: None PROCEDURE: Informed consent was obtained for placement of a tunneled central venous catheter. The patient was placed supine on the interventional table. Ultrasound confirmed a patent right internal jugularvein. Ultrasound images were obtained for documentation. The right side of the neck  was prepped and draped in a sterile fashion. The right side of the neck was anesthetized with 1% lidocaine. Maximal barrier sterile technique was utilized including caps, mask, sterile gowns, sterile gloves, sterile drape, hand hygiene and skin antiseptic. A small incision was made with #11 blade scalpel. A 21 gauge needle directed into the right internal jugular vein with ultrasound guidance. A micropuncture dilator set was placed. A dual Powerline catheter was selected. The skin below the right clavicle was anesthetized and a small incision was made with an #11 blade scalpel. A subcutaneous tunnel was formed to the vein dermatotomy site. The catheter was brought through the tunnel. The vein dermatotomy site was dilated to accommodate a peel-away sheath. The catheter was placed through the peel-away sheath and directed into the central venous structures. The tip of the catheter was placed at the superior cavoatrial junction with fluoroscopy. Fluoroscopic images were obtained for documentation. Both lumens were found to aspirate and flush well. The vein dermatotomy site was closed using Dermabond. The catheter was secured to the skin using Prolene suture. FINDINGS: Catheter tip at the superior cavoatrial junction. Catheter length is 25 cm. COMPLICATIONS: None IMPRESSION: Successful placement of a right jugular tunneled central venous catheter using ultrasound and fluoroscopic guidance. Electronically Signed   By: Markus Daft M.D.   On: 08/12/2017 18:27   Ir US Guide Vasc Access Right  Result Date: 08/12/2017 INDICATION: 67 year old with acute on chronic systolic heart failure. Patient needs long-term central venous access for home inotrope support. EXAM: FLUOROSCOPIC AND ULTRASOUND GUIDED PLACEMENT OF A TUNNELED CENTRAL VENOUS CATHETER Physician: Stephan Minister. Henn, MD FLUOROSCOPY TIME:  18 seconds, 7 mGy MEDICATIONS: None ANESTHESIA/SEDATION: None PROCEDURE: Informed consent was obtained for placement of a tunneled  central venous catheter. The patient was placed supine on the interventional table. Ultrasound confirmed a patent right internal jugularvein. Ultrasound images were obtained for documentation. The right side of the neck was prepped and draped in a sterile fashion. The right side of the neck was anesthetized with 1% lidocaine. Maximal barrier sterile technique was utilized including caps, mask, sterile gowns, sterile gloves, sterile drape, hand hygiene and skin antiseptic. A small incision was made with #11 blade scalpel. A 21 gauge needle directed into the right internal jugular vein with ultrasound guidance. A micropuncture dilator set was placed. A dual Powerline catheter was selected. The skin below the right clavicle was anesthetized and a small incision was made with an #11 blade scalpel. A subcutaneous tunnel was formed to the vein dermatotomy site. The catheter was brought through the tunnel. The vein dermatotomy site was dilated to accommodate a peel-away sheath. The catheter was placed through the peel-away sheath and directed into the central venous structures. The tip of the catheter was placed at the superior cavoatrial junction with fluoroscopy. Fluoroscopic images were obtained  for documentation. Both lumens were found to aspirate and flush well. The vein dermatotomy site was closed using Dermabond. The catheter was secured to the skin using Prolene suture. FINDINGS: Catheter tip at the superior cavoatrial junction. Catheter length is 25 cm. COMPLICATIONS: None IMPRESSION: Successful placement of a right jugular tunneled central venous catheter using ultrasound and fluoroscopic guidance. Electronically Signed   By: Markus Daft M.D.   On: 08/12/2017 18:27     Medications:     Scheduled Medications: . apixaban  5 mg Oral BID  . atorvastatin  80 mg Oral Daily  . digoxin  0.125 mg Oral QHS  . furosemide  80 mg Intravenous BID  . gabapentin  300 mg Oral TID  . insulin aspart  0-15 Units  Subcutaneous TID WC  . insulin aspart  0-5 Units Subcutaneous QHS  . insulin aspart  4 Units Subcutaneous TID WC  . losartan  12.5 mg Oral QHS  . magnesium oxide  400 mg Oral Daily  . pneumococcal 23 valent vaccine  0.5 mL Intramuscular Tomorrow-1000  . potassium chloride  40 mEq Oral BID  . sodium chloride flush  10-40 mL Intracatheter Q12H  . sodium chloride flush  3 mL Intravenous Q12H  . sotalol  120 mg Oral BID    Infusions: . sodium chloride    . sodium chloride    . milrinone 0.25 mcg/kg/min (08/13/17 0755)    PRN Medications: acetaminophen, ondansetron (ZOFRAN) IV, sodium chloride flush, sodium chloride flush    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   Being directly admitted with persistent low cardiac output for milrinone initiation for home.  Assessment/Plan  1. Acute/Chronic systolic CHF: - [facility] 03/2749 EF 20%. He has Medtronic BiV ICD in place.  - Cath 12/18 with stable 1v CAD.  - CPX testing 12/18 with submax test but severe HF limitation with exertional hypotension. Peak VO2: 9.3 (41% predicted peak VO2). VE/VCO2 slope: 53 - Recent  RHC on 5/23 with low output CO 3.5 /CI 1.6  - Dr [name] discussed with Dr Posey Pronto at [facility] scan suggestive of TTR amyloid. -Continue milrinone 0.25 mcg . CO-OX 47%.  - Cardiac output remains low despite inotropic support. With blood type O doubt he will be able to make it to transplant even with DC-CV> Discussed with Dr. [name] and Dr. PVT. Will likely need VAD soon to avoid compromising end-organ function  - Continue losartan 12.5.  -Off b-blocker due to low output and low BP and bradycardia -Continue digoxin 0.125 - On sotalol for VT. Continue for now.   2. [name]  - recent cath with stable CAD as above - Off ASA  with apixaban. Continue statin  - no s/s ischemia.   3. [name] - Recent A1c 8.3 on 6/3.   Cover with SSI in house.  -In the community he is followed by Endocrinology.   4. CKD II-III Creatinine baseline 1.2-1.4  Creatinine 1.2  5. [name] - s/p ICD. Quiescent.  - Continue sotalol.   6. PAF s/p ablation 04/2012 Looks like he is back in A fib.Device interrogation showed A fib since May 16th.  - On Eliquis at home.  -Continue Eliquis 5 mg twice a day.  Medication concerns reviewed with patient and pharmacy team. Barriers identified: none   Length of Stay: 2  Amy Clegg, NP  08/13/2017, 9:44 AM  Advanced Heart Failure Team Pager [phone] (M-F; 7a - 4p)  Please contact [facility] for night-coverage after hours (4p -7a ) and weekends on [email or web address]  Patient seen and examined with Darrick Grinder, NP. We discussed all aspects of the encounter. I agree with the assessment and plan as stated above.   Cardiac output remains very low even with inotropic support. Will not be able to wait for transplant with Blood Type O even with DC-CV. Will need VAD early next week to prevent risk of end-organ dysfunction. I have d/w Dr. PVT and Dr. Posey Pronto at [facility] who agree. Will attempt to schedule for Monday (only VAD spot available next week).  I also reached out to Dr. Risa Grill with [facility] to discuss expediting insurance approval. Continue IV diuresis. Switch Eliquis to heparin.   Glori Bickers, MD  11:01 AM

## 2017-08-13 NOTE — Progress Notes (Addendum)
No ICM remote transmission received for 08/09/2017 and next ICM transmission scheduled for 09/06/2017.    

## 2017-08-13 NOTE — Progress Notes (Signed)
  Echocardiogram 2D Echocardiogram has been performed.  [name] [name] 08/13/2017, 3:16 PM

## 2017-08-13 NOTE — Care Management Note (Signed)
Case Management Note  Patient Details  Name: [name] [name] MRN: [id number] Date of Birth: 10/26/1950  Subjective/Objective:   Pt admitted for HF - will need home milrinone               Action/Plan:  PTA independent from home with wife.  Pt states he has PCP and denied barriers with obtaining/paying for medications.  Pt informed CM that he weights daily and adheres to a low salt diet.  CM gave [facility] choice for infusion - [facility] agreed to by pt and HF team, agency contacted and tentative referral given.  CM will continue to follow   Expected Discharge Date:  08/13/17               Expected Discharge Plan:  [place]  In-House Referral:     Discharge planning Services  CM Consult  Post Acute Care Choice:    Choice offered to:     DME Arranged:    DME Agency:     HH Arranged:    HH Agency:     Status of Service:     If discussed at [employer], dates discussed:    Additional Comments: 08/13/2017 Pt will now receive LVAD prior to discharge - will not discharge home on Milrinone Maryclare Labrador, RN 08/13/2017, 11:33 AM

## 2017-08-14 LAB — GLUCOSE, CAPILLARY
Glucose-Capillary: 179 mg/dL — ABNORMAL HIGH (ref 65–99)
Glucose-Capillary: 169 mg/dL — ABNORMAL HIGH (ref 65–99)
Glucose-Capillary: 205 mg/dL — ABNORMAL HIGH (ref 65–99)
Glucose-Capillary: 163 mg/dL — ABNORMAL HIGH (ref 65–99)
Glucose-Capillary: 199 mg/dL — ABNORMAL HIGH (ref 65–99)

## 2017-08-14 LAB — CBC
HEMATOCRIT: 31.8 % — AB (ref 39.0–52.0)
HEMOGLOBIN: 10 g/dL — AB (ref 13.0–17.0)
MCH: 27.1 pg (ref 26.0–34.0)
MCHC: 31.4 g/dL (ref 30.0–36.0)
MCV: 86.2 fL (ref 78.0–100.0)
Platelets: 174 10*3/uL (ref 150–400)
RBC: 3.69 MIL/uL — ABNORMAL LOW (ref 4.22–5.81)
RDW: 15.7 % — AB (ref 11.5–15.5)
WBC: 4.6 10*3/uL (ref 4.0–10.5)

## 2017-08-14 LAB — BASIC METABOLIC PANEL
ANION GAP: 6 (ref 5–15)
BUN: 19 mg/dL (ref 6–20)
CO2: 25 mmol/L (ref 22–32)
Calcium: 8.5 mg/dL — ABNORMAL LOW (ref 8.9–10.3)
Chloride: 105 mmol/L (ref 101–111)
Creatinine, Ser: 1.2 mg/dL (ref 0.61–1.24)
GFR calc Af Amer: >60 mL/min (ref >60)
GFR calc non Af Amer: >60 mL/min (ref >60)
GLUCOSE: 171 mg/dL — AB (ref 65–99)
POTASSIUM: 3.9 mmol/L (ref 3.5–5.1)
Sodium: 136 mmol/L (ref 135–145)

## 2017-08-14 LAB — APTT
APTT: 114 s — AB (ref 24–36)
aPTT: 138 seconds — ABNORMAL HIGH (ref 24–36)
aPTT: 111 seconds — ABNORMAL HIGH (ref 24–36)

## 2017-08-14 LAB — COOXEMETRY PANEL
CARBOXYHEMOGLOBIN: 1.8 % — AB (ref 0.5–1.5)
Carboxyhemoglobin: 1.9 % — ABNORMAL HIGH (ref 0.5–1.5)
METHEMOGLOBIN: 1.3 % (ref 0.0–1.5)
Methemoglobin: 1.4 % (ref 0.0–1.5)
O2 SAT: 54.8 %
O2 Saturation: 54 %
TOTAL HEMOGLOBIN: 11.7 g/dL — AB (ref 12.0–16.0)
Total hemoglobin: 10 g/dL — ABNORMAL LOW (ref 12.0–16.0)

## 2017-08-14 LAB — PROTIME-INR
INR: 1.92
Prothrombin Time: 21.8 seconds — ABNORMAL HIGH (ref 11.4–15.2)

## 2017-08-14 LAB — HEMOGLOBIN A1C
Hgb A1c MFr Bld: 8.4 % — ABNORMAL HIGH (ref 4.8–5.6)
MEAN PLASMA GLUCOSE: 194.38 mg/dL

## 2017-08-14 MED ORDER — METOLAZONE 5 MG PO TABS
5.0000 mg | ORAL_TABLET | Freq: Once | ORAL | Status: AC
  Administered 2017-08-14: 5 mg via ORAL
  Filled 2017-08-14: qty 1

## 2017-08-14 MED ORDER — HEPARIN (PORCINE) IN NACL 100-0.45 UNIT/ML-% IJ SOLN
900.0000 [IU]/h | INTRAMUSCULAR | Status: DC
  Administered 2017-08-15: 900 [IU]/h via INTRAVENOUS
  Filled 2017-08-14: qty 250

## 2017-08-14 NOTE — Progress Notes (Signed)
ANTICOAGULATION CONSULT NOTE - [place] for heparin Indication: atrial fibrillation  Allergies  Allergen Reactions  . Penicillins Hives, Itching and Other (See Comments)    Has patient had a PCN reaction causing immediate rash, facial/tongue/throat swelling, SOB or lightheadedness with hypotension: Yes Has patient had a PCN reaction causing severe rash involving mucus membranes or skin necrosis: No Has patient had a PCN reaction that required hospitalization: No Has patient had a PCN reaction occurring within the last 10 years: No If all of the above answers are "NO", then may proceed with Cephalosporin use.     Patient Measurements: Height: 5' 10.98" (180.3 cm) Weight: 238 lb 15.7 oz (108.4 kg) IBW/kg (Calculated) : 75.26 Heparin Dosing Weight: 98.3 kg  Vital Signs: Temp: 98.4 F (36.9 C) (06/15 1200) Temp Source: Core (06/15 1200) BP: 114/77 (06/15 1200) Pulse Rate: 70 (06/15 1200)  Labs: Recent Labs    08/11/17 1558 08/12/17 0344 08/12/17 0942 08/13/17 0322 08/13/17 1017 08/13/17 2036 08/14/17 0530 08/14/17 1255  HGB  --   --  10.9*  --  10.5*  --  10.0*  --   HCT  --   --  35.9*  --  34.1*  --  31.8*  --   PLT  --   --  171  --  185  --  174  --   APTT  --   --   --   --   --   --  138* 111*  LABPROT 20.7*  --   --   --   --   --  21.8*  --   INR 1.80  --   --   --   --   --  1.92  --   HEPARINUNFRC  --   --   --   --   --  1.98*  --   --   CREATININE  --  1.29*  --  1.22  --   --  1.20  --     Estimated Creatinine Clearance: 74.8 mL/min (by C-G formula based on SCr of 1.2 mg/dL).   Medical History: Past Medical History:  Diagnosis Date  . AICD (automatic cardioverter/defibrillator) present 02/05/2014   Upgrade to Medtronic biventricular ICD, serial number  BLD [id number]   . Atrial flutter ([place]) 04/2012   s/p TEE-EPS+RFCA 04/2012  . CAD (coronary artery disease) 1751,0258 X 2    RCA-T, 70% PL (off CFX), 99% Prox LAD/90% Dist  LAD, S/P TAXUS stent x 2  . CHF (congestive heart failure) ([place])   . Chronic anticoagulation   . Chronic systolic heart failure ([place])   . CKD (chronic kidney disease)   . Diabetic retinopathy ([place])   . DM type 2 (diabetes mellitus, type 2) (HCC)    insulin dependent  . HTN (hypertension)   . Hypercholesteremia    ablation  . ICD (implantable cardiac defibrillator) in place   . Ischemic cardiomyopathy March 2015   20-25% 2D   . Nephrolithiasis   . Ventricular tachycardia (H[facility])     Medications:  Scheduled:  . atorvastatin  80 mg Oral Daily  . digoxin  0.125 mg Oral QHS  . furosemide  80 mg Intravenous BID  . gabapentin  300 mg Oral TID  . insulin aspart  0-15 Units Subcutaneous TID WC  . insulin aspart  0-5 Units Subcutaneous QHS  . insulin aspart  4 Units Subcutaneous TID WC  . losartan  12.5 mg Oral QHS  . magnesium  oxide  400 mg Oral Daily  . pneumococcal 23 valent vaccine  0.5 mL Intramuscular Tomorrow-1000  . potassium chloride  40 mEq Oral BID  . sodium chloride flush  10-40 mL Intracatheter Q12H  . sodium chloride flush  3 mL Intravenous Q12H  . sodium chloride flush  3 mL Intravenous Q12H  . sodium chloride flush  3 mL Intravenous Q12H  . sotalol  120 mg Oral BID    Assessment: 67 yo male on chronic apixaban, now admitted for planned LVAD on Monday.  Last apixaban dose 6/14 am.  Pharmacy asked to start IV heparin since apixaban stopped for VAD. APTT supratherapeutic at 111 seconds, CBC low but stable.  Goal of Therapy:  Heparin level 0.3-0.7 units/ml  APTT 66-102 seconds Monitor platelets by anticoagulation protocol: Yes   Plan:  -Reduce heparin to 1000 units/hr -Recheck 6-hr aPTT -Daily aPTT/heparin level  Arrie Senate, PharmD, BCPS PGY-2 Cardiology Pharmacy Resident Phone: [phone] 08/14/2017

## 2017-08-14 NOTE — Progress Notes (Signed)
Initial Nutrition Assessment  DOCUMENTATION CODES:   Obesity unspecified  INTERVENTION:   -Reviewed Heart Healthy/Carb modified diet; reinforced adherence at discharge  -Recommend removal of Renal Diet restriction. Recommend Heart Heathy/Carb Modified diet   NUTRITION DIAGNOSIS:   Food and nutrition related knowledge deficit related to chronic illness as evidenced by per patient/family report.  GOAL:   Patient will meet greater than or equal to 90% of their needs   MONITOR:   PO intake, Supplement acceptance, Labs, Weight trends  REASON FOR ASSESSMENT:   Consult LVAD Eval  ASSESSMENT:   67 yo male admitted with acute on chronic CHF, pt being worked up for destination LVAD. Pt with hx of CHF due to ICM, CAD s/p PCI, PAD, DM, HTN, HL, CKD II/III   Noted plan for LVAD on 6/17  Recorded po intake 100% at breakfast. Pt reports good appetite currently and PTA.   Pt reports "dry weight" of 215 pounds, reports this dry weight has been stable. Current wt 238 pounds, down from 249 pounds on admission.   Pt does NOT meet clinical characteristics for malnutrition  Labs: reviewed Meds: lasix, ss novolog, mag ox, KCl   NUTRITION - FOCUSED PHYSICAL EXAM:    Most Recent Value  Orbital Region  No depletion  Upper Arm Region  No depletion  Thoracic and Lumbar Region  No depletion  Buccal Region  No depletion  Temple Region  No depletion  Clavicle Bone Region  No depletion  Clavicle and Acromion Bone Region  No depletion  Scapular Bone Region  No depletion  Dorsal Hand  No depletion  Patellar Region  No depletion  Anterior Thigh Region  No depletion  Posterior Calf Region  No depletion  Edema (RD Assessment)  Mild       Diet Order:   Diet Order           Diet heart healthy/carb modified Room service appropriate? Yes; Fluid consistency: Thin; Fluid restriction: 2000 mL Fluid  Diet effective now          EDUCATION NEEDS:   Education needs have been  addressed  Skin:  Skin Assessment: Reviewed RN Assessment  Last BM:  6/12  Height:   Ht Readings from Last 1 Encounters:  08/13/17 5' 10.98" (1.803 m)    Weight:   Wt Readings from Last 1 Encounters:  08/14/17 238 lb 15.7 oz (108.4 kg)    Ideal Body Weight:     BMI:  Body mass index is 33.35 kg/m.  Estimated Nutritional Needs:   Kcal:  2100-2300 kcals  Protein:  105-115 g  Fluid:  2 L   Kerman Passey MS, RD, LDN, CNSC [phone] Pager  [phone] Weekend/On-Call Pager

## 2017-08-14 NOTE — Progress Notes (Signed)
ANTICOAGULATION CONSULT NOTE - [place] for heparin Indication: atrial fibrillation  Allergies  Allergen Reactions  . Penicillins Hives, Itching and Other (See Comments)    Has patient had a PCN reaction causing immediate rash, facial/tongue/throat swelling, SOB or lightheadedness with hypotension: Yes Has patient had a PCN reaction causing severe rash involving mucus membranes or skin necrosis: No Has patient had a PCN reaction that required hospitalization: No Has patient had a PCN reaction occurring within the last 10 years: No If all of the above answers are "NO", then may proceed with Cephalosporin use.     Patient Measurements: Height: 5' 10.98" (180.3 cm) Weight: 238 lb 15.7 oz (108.4 kg) IBW/kg (Calculated) : 75.26 Heparin Dosing Weight: 98.3 kg  Vital Signs: Temp: 98.2 F (36.8 C) (06/15 2030) Temp Source: Core (06/15 1600) BP: 93/67 (06/15 2000) Pulse Rate: 62 (06/15 2030)  Labs: Recent Labs    08/12/17 0344  08/12/17 0942 08/13/17 0322 08/13/17 1017 08/13/17 2036 08/14/17 0530 08/14/17 1255 08/14/17 1958  HGB  --    < > 10.9*  --  10.5*  --  10.0*  --   --   HCT  --   --  35.9*  --  34.1*  --  31.8*  --   --   PLT  --   --  171  --  185  --  174  --   --   APTT  --   --   --   --   --   --  138* 111* 114*  LABPROT  --   --   --   --   --   --  21.8*  --   --   INR  --   --   --   --   --   --  1.92  --   --   HEPARINUNFRC  --   --   --   --   --  1.98*  --   --   --   CREATININE 1.29*  --   --  1.22  --   --  1.20  --   --    < > = values in this interval not displayed.    Estimated Creatinine Clearance: 74.8 mL/min (by C-G formula based on SCr of 1.2 mg/dL).   Medical History: Past Medical History:  Diagnosis Date  . AICD (automatic cardioverter/defibrillator) present 02/05/2014   Upgrade to Medtronic biventricular ICD, serial number  BLD [id number]   . Atrial flutter ([place]) 04/2012   s/p TEE-EPS+RFCA 04/2012  . CAD  (coronary artery disease) 6073,7106 X 2    RCA-T, 70% PL (off CFX), 99% Prox LAD/90% Dist LAD, S/P TAXUS stent x 2  . CHF (congestive heart failure) ([place])   . Chronic anticoagulation   . Chronic systolic heart failure ([place])   . CKD (chronic kidney disease)   . Diabetic retinopathy ([place])   . DM type 2 (diabetes mellitus, type 2) (H[facility])    insulin dependent  . HTN (hypertension)   . Hypercholesteremia    ablation  . ICD (implantable cardiac defibrillator) in place   . Ischemic cardiomyopathy March 2015   20-25% 2D   . Nephrolithiasis   . Ventricular tachycardia (H[facility])     Medications:  Scheduled:  . atorvastatin  80 mg Oral Daily  . digoxin  0.125 mg Oral QHS  . furosemide  80 mg Intravenous BID  . gabapentin  300 mg Oral TID  .  insulin aspart  0-15 Units Subcutaneous TID WC  . insulin aspart  0-5 Units Subcutaneous QHS  . insulin aspart  4 Units Subcutaneous TID WC  . losartan  12.5 mg Oral QHS  . magnesium oxide  400 mg Oral Daily  . pneumococcal 23 valent vaccine  0.5 mL Intramuscular Tomorrow-1000  . potassium chloride  40 mEq Oral BID  . sodium chloride flush  10-40 mL Intracatheter Q12H  . sodium chloride flush  3 mL Intravenous Q12H  . sodium chloride flush  3 mL Intravenous Q12H  . sodium chloride flush  3 mL Intravenous Q12H  . sotalol  120 mg Oral BID    Assessment: 67 yo male on chronic apixaban, now admitted for planned LVAD on Monday.  Last apixaban dose 6/14 am.  Pharmacy asked to start IV heparin since apixaban stopped for VAD.  APTT remains supratherapeutic at 114 seconds despite heparin rate decreased to 1000 units/hr.   Goal of Therapy:  Heparin level 0.3-0.7 units/ml  APTT 66-102 seconds Monitor platelets by anticoagulation protocol: Yes   Plan:  Hold heparin x 1 hour then reduce heparin rate to 750 units/hr -Recheck 6-8-hr aPTT -Daily aPTT/heparin level   Nicole Cella, RPh Clinical Pharmacist [phone] main pharmacy 08/14/2017

## 2017-08-14 NOTE — Progress Notes (Signed)
ANTICOAGULATION CONSULT NOTE - Follow Up Consult  Pharmacy Consult for heparin Indication: atrial fibrillation  Labs: Recent Labs    08/11/17 1438 08/11/17 1558 08/12/17 0344 08/12/17 0942 08/13/17 0322 08/13/17 1017 08/13/17 2036 08/14/17 0530  HGB 11.1*  --   --  10.9*  --  10.5*  --  10.0*  HCT 36.8*  --   --  35.9*  --  34.1*  --  31.8*  PLT 176  --   --  171  --  185  --  174  APTT  --   --   --   --   --   --   --  138*  LABPROT  --  20.7*  --   --   --   --   --  21.8*  INR  --  1.80  --   --   --   --   --  1.92  HEPARINUNFRC  --   --   --   --   --   --  1.98*  --   CREATININE 1.47*  --  1.29*  --  1.22  --   --   --     Assessment: 67yo male supratherapeutic on heparin with initial dosing while Eliquis on hold.  Goal of Therapy:  aPTT 66-102 seconds   Plan:  Will decrease heparin gtt by 2 units/kg/hr to 1100 units/hr and check PTT in 6 hours.    Wynona Neat, PharmD, BCPS  08/14/2017,6:50 AM

## 2017-08-14 NOTE — Progress Notes (Signed)
Advanced Heart Failure Rounding Note  PCP-Cardiologist: No primary care provider on file.   Subjective:     Admitted for PICC and initiation of milrinone. Noted to be in Afib since May 16th.   Co-ox was low on milrinone 0.25. Milrinone increased to 0.375.  Taken to cath lab 6/14 for RHC and possible IABP. Swan numbers improved on milrinone 0.375 so IABP not placed.   Remains on IV lasix 80 IV bid. Weigh unchanged. Creatinine stable   Swan #s from this am CVP 10 PA 44/21 Thermo 4.2/1.8 Co-ox 54%   I repeated swan numbers personally CVP 12 PA 42/19 (28) PCWP 23 Thermo 3.3/1.4 Co-ox pending  Objective:   Weight Range: 108.4 kg (238 lb 15.7 oz) Body mass index is 33.35 kg/m.   Vital Signs:   Temp:  [96.4 F (35.8 C)-99 F (37.2 C)] 98.4 F (36.9 C) (06/15 1400) Pulse Rate:  [56-145] 66 (06/15 1400) Resp:  [10-88] 17 (06/15 1400) BP: (83-138)/(58-93) 96/67 (06/15 1400) SpO2:  [80 %-98 %] 95 % (06/15 1400) Weight:  [108.1 kg (238 lb 5.1 oz)-108.4 kg (238 lb 15.7 oz)] 108.4 kg (238 lb 15.7 oz) (06/15 0630) Last BM Date: 08/11/17  Weight change: Filed Weights   08/13/17 0406 08/13/17 1823 08/14/17 0630  Weight: 110.4 kg (243 lb 6.2 oz) 108.1 kg (238 lb 5.1 oz) 108.4 kg (238 lb 15.7 oz)    Intake/Output:   Intake/Output Summary (Last 24 hours) at 08/14/2017 1446 Last data filed at 08/14/2017 1400 Gross per 24 hour  Intake 1129.86 ml  Output 1826 ml  Net -696.14 ml      Physical Exam    General:  Sitting up in bed  No resp difficulty HEENT: normal Neck: supple. LIJ swan. RIJ tunneled PICC  JVP 10 Carotids 2+ bilat; no bruits. No lymphadenopathy or thryomegaly appreciated. Cor: PMI laterally displaced. Regular rate & rhythm. +s3 Lungs: clear Abdomen: obese soft, nontender, nondistended. No hepatosplenomegaly. No bruits or masses. Good bowel sounds. Extremities: no cyanosis, clubbing, rash, edema Neuro: alert & orientedx3, cranial nerves grossly intact.  moves all 4 extremities w/o difficulty. Affect pleasant    Telemetry   AF underlying. V paced 60s. Personally reviewed.   EKG    N/A Labs    CBC Recent Labs    08/13/17 1017 08/14/17 0530  WBC 5.2 4.6  HGB 10.5* 10.0*  HCT 34.1* 31.8*  MCV 87.7 86.2  PLT 185 540   Basic Metabolic Panel Recent Labs    08/12/17 0344 08/13/17 0322 08/14/17 0530  NA 140 136 136  K 3.6 3.7 3.9  CL 106 104 105  CO2 24 25 25   GLUCOSE 204* 146* 171*  BUN 19 20 19   CREATININE 1.29* 1.22 1.20  CALCIUM 8.9 8.7* 8.5*  MG 2.0  --   --    Liver Function Tests No results for input(s): AST, ALT, ALKPHOS, BILITOT, PROT, ALBUMIN in the last 72 hours. No results for input(s): LIPASE, AMYLASE in the last 72 hours. Cardiac Enzymes No results for input(s): CKTOTAL, CKMB, CKMBINDEX, TROPONINI in the last 72 hours.  BNP: BNP (last 3 results) Recent Labs    11/18/16 1355 01/15/17 0911 08/11/17 1438  BNP 584.3* 159.6* 529.3*    ProBNP (last 3 results) Recent Labs    11/12/16 1559  PROBNP 388.0*     D-Dimer No results for input(s): DDIMER in the last 72 hours. Hemoglobin A1C Recent Labs    08/14/17 0530  HGBA1C 8.4*   Fasting  Lipid Panel No results for input(s): CHOL, HDL, LDLCALC, TRIG, CHOLHDL, LDLDIRECT in the last 72 hours. Thyroid Function Tests No results for input(s): TSH, T4TOTAL, T3FREE, THYROIDAB in the last 72 hours.  Invalid input(s): FREET3  Other results:   Imaging    No results found.   Medications:     Scheduled Medications: . atorvastatin  80 mg Oral Daily  . digoxin  0.125 mg Oral QHS  . furosemide  80 mg Intravenous BID  . gabapentin  300 mg Oral TID  . insulin aspart  0-15 Units Subcutaneous TID WC  . insulin aspart  0-5 Units Subcutaneous QHS  . insulin aspart  4 Units Subcutaneous TID WC  . losartan  12.5 mg Oral QHS  . magnesium oxide  400 mg Oral Daily  . pneumococcal 23 valent vaccine  0.5 mL Intramuscular Tomorrow-1000  . potassium  chloride  40 mEq Oral BID  . sodium chloride flush  10-40 mL Intracatheter Q12H  . sodium chloride flush  3 mL Intravenous Q12H  . sodium chloride flush  3 mL Intravenous Q12H  . sodium chloride flush  3 mL Intravenous Q12H  . sotalol  120 mg Oral BID    Infusions: . sodium chloride    . sodium chloride    . sodium chloride 10 mL/hr at 08/14/17 1400  . heparin 1,000 Units/hr (08/14/17 1358)  . milrinone 0.375 mcg/kg/min (08/14/17 1042)    PRN Medications: sodium chloride, sodium chloride, acetaminophen, ondansetron (ZOFRAN) IV, sodium chloride flush, sodium chloride flush, sodium chloride flush, sodium chloride flush    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   Directly admitted with persistent low cardiac output for milrinone initiation for home.  Assessment/Plan  1. Acute/Chronic systolic CHF: - Echo  06/18/3788 EF 20-25%. He has Medtronic BiV ICD in place.  - Cath 12/18 with stable 1v CAD.  - CPX testing 12/18 with submax test but severe HF limitation with exertional hypotension. Peak VO2: 9.3 (41% predicted peak VO2). VE/VCO2 slope: 53 - Recent  RHC on 5/23 with low output CO 3.5 /CI 1.6  - PYP scan suggestive of TTR amyloid. - Hemodynamics improved this am on milrinone 0.375 but worse this afternoon. Increase milrinone to 0.5. May need NE or IABP.  Continue IV diuresis. Will give one dose metolazone - I discussed case with Dr. Posey Pronto at [facility]. With blood type O doubt he will be able to make it to transplant. Plan VAD support. Tentatively scheduled for Monday. D/w Dr. Prescott Gum -Off b-blocker due to low output and low BP and bradycardia -Continue digoxin 0.125 - On sotalol for VT. Continue for now.   2. [name]  - recent cath with stable CAD as above - off apixaban.  Resume ASA 81  3. [name] - Recent A1c 8.3 on 6/3.   Cover with SSI in house.  -In the community he is followed by Endocrinology.   4. CKD II-III Creatinine baseline 1.2-1.4  Creatinine 1.20 today  5. [name] - s/p ICD. Quiescent.  - Continue sotalol.  - Keep K>= 4.0 Mg >= 2.0  6. PAF s/p ablation 04/2012 - Device interrogation showed A fib since May 16th.  - On Eliquis at home. Now covering with heparin   CRITICAL  CARE Performed by: Glori Bickers  Total critical care time: 45 minutes  Critical care time was exclusive of separately billable procedures and treating other patients.  Critical care was necessary to treat or prevent imminent or life-threatening deterioration.  Critical care was time spent personally by me (independent of midlevel providers or residents) on the following activities: development of treatment plan with patient and/or surrogate as well as nursing, discussions with consultants, evaluation of patient's response to treatment, examination of patient, obtaining history from patient or surrogate, ordering and performing treatments and interventions, ordering and review of laboratory studies, ordering and review of radiographic studies, pulse oximetry and re-evaluation of patient's condition.   Medication concerns reviewed with patient and pharmacy team. Barriers identified: none   Length of Stay: 3  Glori Bickers, MD  08/14/2017, 2:46 PM  Advanced Heart Failure Team Pager [phone] (M-F; [place])  Please contact [facility] for night-coverage after hours (4p -7a ) and weekends on [email or web address]

## 2017-08-15 ENCOUNTER — Inpatient Hospital Stay (HOSPITAL_COMMUNITY): Payer: PPO

## 2017-08-15 ENCOUNTER — Other Ambulatory Visit (HOSPITAL_COMMUNITY): Payer: PPO

## 2017-08-15 DIAGNOSIS — Z515 Encounter for palliative care: Secondary | ICD-10-CM

## 2017-08-15 DIAGNOSIS — Z7189 Other specified counseling: Secondary | ICD-10-CM

## 2017-08-15 LAB — GLUCOSE, CAPILLARY
GLUCOSE-CAPILLARY: 198 mg/dL — AB (ref 65–99)
GLUCOSE-CAPILLARY: 264 mg/dL — AB (ref 65–99)
Glucose-Capillary: 255 mg/dL — ABNORMAL HIGH (ref 65–99)
Glucose-Capillary: 189 mg/dL — ABNORMAL HIGH (ref 65–99)

## 2017-08-15 LAB — CBC
HCT: 33.6 % — ABNORMAL LOW (ref 39.0–52.0)
Hemoglobin: 10.6 g/dL — ABNORMAL LOW (ref 13.0–17.0)
MCH: 26.9 pg (ref 26.0–34.0)
MCHC: 31.5 g/dL (ref 30.0–36.0)
MCV: 85.3 fL (ref 78.0–100.0)
PLATELETS: 197 10*3/uL (ref 150–400)
RBC: 3.94 MIL/uL — AB (ref 4.22–5.81)
RDW: 15.7 % — ABNORMAL HIGH (ref 11.5–15.5)
WBC: 5 10*3/uL (ref 4.0–10.5)

## 2017-08-15 LAB — COOXEMETRY PANEL
CARBOXYHEMOGLOBIN: 1.5 % (ref 0.5–1.5)
Carboxyhemoglobin: 2.1 % — ABNORMAL HIGH (ref 0.5–1.5)
Carboxyhemoglobin: 1.6 % — ABNORMAL HIGH (ref 0.5–1.5)
Carboxyhemoglobin: 1.8 % — ABNORMAL HIGH (ref 0.5–1.5)
Methemoglobin: 0.9 % (ref 0.0–1.5)
Methemoglobin: 1.2 % (ref 0.0–1.5)
Methemoglobin: 0.9 % (ref 0.0–1.5)
Methemoglobin: 0.9 % (ref 0.0–1.5)
O2 SAT: 60.1 %
O2 SAT: 42 %
O2 Saturation: 49.6 %
O2 Saturation: 51.4 %
TOTAL HEMOGLOBIN: 11.8 g/dL — AB (ref 12.0–16.0)
Total hemoglobin: 10.9 g/dL — ABNORMAL LOW (ref 12.0–16.0)
Total hemoglobin: 11.6 g/dL — ABNORMAL LOW (ref 12.0–16.0)
Total hemoglobin: 12 g/dL (ref 12.0–16.0)

## 2017-08-15 LAB — BASIC METABOLIC PANEL
ANION GAP: 6 (ref 5–15)
Anion gap: 7 (ref 5–15)
BUN: 20 mg/dL (ref 6–20)
BUN: 20 mg/dL (ref 6–20)
CHLORIDE: 101 mmol/L (ref 101–111)
CO2: 25 mmol/L (ref 22–32)
CO2: 26 mmol/L (ref 22–32)
CREATININE: 1.4 mg/dL — AB (ref 0.61–1.24)
Calcium: 8.5 mg/dL — ABNORMAL LOW (ref 8.9–10.3)
Calcium: 8.8 mg/dL — ABNORMAL LOW (ref 8.9–10.3)
Chloride: 102 mmol/L (ref 101–111)
Creatinine, Ser: 1.36 mg/dL — ABNORMAL HIGH (ref 0.61–1.24)
GFR calc Af Amer: >60 mL/min (ref >60)
GFR calc non Af Amer: 52 mL/min — ABNORMAL LOW (ref >60)
GFR, EST AFRICAN AMERICAN: 59 mL/min — AB (ref >60)
GFR, EST NON AFRICAN AMERICAN: 50 mL/min — AB (ref >60)
Glucose, Bld: 215 mg/dL — ABNORMAL HIGH (ref 65–99)
Glucose, Bld: 252 mg/dL — ABNORMAL HIGH (ref 65–99)
POTASSIUM: 4 mmol/L (ref 3.5–5.1)
Potassium: 3.8 mmol/L (ref 3.5–5.1)
SODIUM: 133 mmol/L — AB (ref 135–145)
SODIUM: 134 mmol/L — AB (ref 135–145)

## 2017-08-15 LAB — MRSA PCR SCREENING: MRSA by PCR: NEGATIVE

## 2017-08-15 LAB — APTT
APTT: 70 s — AB (ref 24–36)
aPTT: 54 seconds — ABNORMAL HIGH (ref 24–36)

## 2017-08-15 LAB — HEPARIN LEVEL (UNFRACTIONATED): Heparin Unfractionated: 0.48 IU/mL (ref 0.30–0.70)

## 2017-08-15 LAB — PROTIME-INR
INR: 1.37
Prothrombin Time: 16.7 seconds — ABNORMAL HIGH (ref 11.4–15.2)

## 2017-08-15 LAB — PREPARE RBC (CROSSMATCH)

## 2017-08-15 MED ORDER — RIFAMPIN 600 MG IV SOLR
600.0000 mg | INTRAVENOUS | Status: AC
  Administered 2017-08-16: 600 mg via INTRAVENOUS
  Filled 2017-08-15: qty 600

## 2017-08-15 MED ORDER — SODIUM CHLORIDE 0.9 % IV SOLN: INTRAVENOUS | Status: DC

## 2017-08-15 MED ORDER — TRANEXAMIC ACID (OHS) BOLUS VIA INFUSION
15.0000 mg/kg | INTRAVENOUS | Status: AC
Start: 2017-08-16 — End: 2017-08-16
  Administered 2017-08-16: 1680 mg via INTRAVENOUS
  Filled 2017-08-15: qty 1680

## 2017-08-15 MED ORDER — TRANEXAMIC ACID (OHS) PUMP PRIME SOLUTION
2.0000 mg/kg | INTRAVENOUS | Status: DC
  Filled 2017-08-15: qty 2.24

## 2017-08-15 MED ORDER — SODIUM CHLORIDE 0.9 % IV SOLN: 600.0000 mg | INTRAVENOUS | Status: DC

## 2017-08-15 MED ORDER — ALPRAZOLAM 0.25 MG PO TABS: 0.2500 mg | ORAL_TABLET | ORAL | Status: DC | PRN

## 2017-08-15 MED ORDER — CHLORHEXIDINE GLUCONATE CLOTH 2 % EX PADS: 6.0000 | MEDICATED_PAD | Freq: Once | CUTANEOUS | Status: AC

## 2017-08-15 MED ORDER — VANCOMYCIN HCL 1 G IV SOLR: 1000.0000 mg | INTRAVENOUS | Status: DC

## 2017-08-15 MED ORDER — DOBUTAMINE IN D5W 4-5 MG/ML-% IV SOLN: 2.0000 ug/kg/min | INTRAVENOUS | Status: DC

## 2017-08-15 MED ORDER — MILRINONE LACTATE IN DEXTROSE 20-5 MG/100ML-% IV SOLN
0.3000 ug/kg/min | INTRAVENOUS | Status: DC
  Filled 2017-08-15: qty 100

## 2017-08-15 MED ORDER — DOPAMINE-DEXTROSE 3.2-5 MG/ML-% IV SOLN
0.0000 ug/kg/min | INTRAVENOUS | Status: DC
  Filled 2017-08-15: qty 250

## 2017-08-15 MED ORDER — MUPIROCIN 2 % EX OINT
1.0000 "application " | TOPICAL_OINTMENT | Freq: Two times a day (BID) | CUTANEOUS | Status: AC
  Administered 2017-08-15 (×2): 1 via NASAL
  Filled 2017-08-15 (×2): qty 22

## 2017-08-15 MED ORDER — TRANEXAMIC ACID (OHS) BOLUS VIA INFUSION: 15.0000 mg/kg | INTRAVENOUS | Status: DC

## 2017-08-15 MED ORDER — NOREPINEPHRINE 4 MG/250ML-% IV SOLN
0.0000 ug/min | INTRAVENOUS | Status: AC
  Administered 2017-08-16: 4 ug/min via INTRAVENOUS
  Administered 2017-08-16: 2 ug/min via INTRAVENOUS
  Filled 2017-08-15: qty 250

## 2017-08-15 MED ORDER — LEVOFLOXACIN IN D5W 500 MG/100ML IV SOLN: 500.0000 mg | INTRAVENOUS | Status: DC

## 2017-08-15 MED ORDER — FLUCONAZOLE IN SODIUM CHLORIDE 400-0.9 MG/200ML-% IV SOLN
400.0000 mg | INTRAVENOUS | Status: AC
  Administered 2017-08-16: 400 mg via INTRAVENOUS
  Filled 2017-08-15: qty 200

## 2017-08-15 MED ORDER — LEVOFLOXACIN IN D5W 500 MG/100ML IV SOLN
500.0000 mg | INTRAVENOUS | Status: AC
  Administered 2017-08-16: 500 mg via INTRAVENOUS
  Filled 2017-08-15: qty 100

## 2017-08-15 MED ORDER — MAGNESIUM SULFATE 50 % IJ SOLN
40.0000 meq | INTRAMUSCULAR | Status: DC
  Filled 2017-08-15: qty 9.85

## 2017-08-15 MED ORDER — DIAZEPAM 5 MG PO TABS
5.0000 mg | ORAL_TABLET | Freq: Once | ORAL | Status: AC
  Administered 2017-08-16: 5 mg via ORAL
  Filled 2017-08-15: qty 1

## 2017-08-15 MED ORDER — VANCOMYCIN HCL 10 G IV SOLR
1500.0000 mg | INTRAVENOUS | Status: AC
  Administered 2017-08-16: 1500 mg via INTRAVENOUS
  Filled 2017-08-15: qty 1500

## 2017-08-15 MED ORDER — BISACODYL 5 MG PO TBEC
5.0000 mg | DELAYED_RELEASE_TABLET | Freq: Once | ORAL | Status: AC
  Administered 2017-08-15: 5 mg via ORAL
  Filled 2017-08-15: qty 1

## 2017-08-15 MED ORDER — DEXMEDETOMIDINE HCL IN NACL 400 MCG/100ML IV SOLN
0.1000 ug/kg/h | INTRAVENOUS | Status: AC
  Administered 2017-08-16: .5 ug/kg/h via INTRAVENOUS
  Filled 2017-08-15 (×2): qty 100

## 2017-08-15 MED ORDER — TEMAZEPAM 15 MG PO CAPS: 15.0000 mg | ORAL_CAPSULE | Freq: Once | ORAL | Status: DC | PRN

## 2017-08-15 MED ORDER — VANCOMYCIN HCL 10 G IV SOLR: 1500.0000 mg | INTRAVENOUS | Status: DC

## 2017-08-15 MED ORDER — DOBUTAMINE IN D5W 4-5 MG/ML-% IV SOLN
2.0000 ug/kg/min | INTRAVENOUS | Status: DC
  Filled 2017-08-15: qty 250

## 2017-08-15 MED ORDER — FLUCONAZOLE IN SODIUM CHLORIDE 400-0.9 MG/200ML-% IV SOLN: 400.0000 mg | INTRAVENOUS | Status: DC

## 2017-08-15 MED ORDER — NITROGLYCERIN IN D5W 200-5 MCG/ML-% IV SOLN: 0.0000 ug/min | INTRAVENOUS | Status: DC

## 2017-08-15 MED ORDER — VASOPRESSIN 20 UNIT/ML IV SOLN: 0.0400 [IU]/min | INTRAVENOUS | Status: DC

## 2017-08-15 MED ORDER — VANCOMYCIN HCL 10 G IV SOLR: 1250.0000 mg | INTRAVENOUS | Status: DC

## 2017-08-15 MED ORDER — TRANEXAMIC ACID (OHS) PUMP PRIME SOLUTION: 2.0000 mg/kg | INTRAVENOUS | Status: DC

## 2017-08-15 MED ORDER — POTASSIUM CHLORIDE 2 MEQ/ML IV SOLN: 80.0000 meq | INTRAVENOUS | Status: DC

## 2017-08-15 MED ORDER — DOPAMINE-DEXTROSE 3.2-5 MG/ML-% IV SOLN: 0.0000 ug/kg/min | INTRAVENOUS | Status: DC

## 2017-08-15 MED ORDER — SODIUM CHLORIDE 0.9 % IV SOLN
0.0000 ug/min | INTRAVENOUS | Status: DC
  Filled 2017-08-15 (×4): qty 2

## 2017-08-15 MED ORDER — PHENYLEPHRINE HCL 10 MG/ML IJ SOLN: 0.0000 ug/min | INTRAMUSCULAR | Status: DC

## 2017-08-15 MED ORDER — MILRINONE LACTATE IN DEXTROSE 20-5 MG/100ML-% IV SOLN: 0.3000 ug/kg/min | INTRAVENOUS | Status: DC

## 2017-08-15 MED ORDER — VANCOMYCIN HCL 1 G IV SOLR
1000.0000 mg | INTRAVENOUS | Status: DC
  Filled 2017-08-15: qty 1000

## 2017-08-15 MED ORDER — EPINEPHRINE PF 1 MG/ML IJ SOLN
0.0000 ug/min | INTRAVENOUS | Status: AC
  Administered 2017-08-16: 1 ug/min via INTRAVENOUS
  Filled 2017-08-15 (×5): qty 4

## 2017-08-15 MED ORDER — MAGNESIUM SULFATE 50 % IJ SOLN
40.0000 meq | INTRAMUSCULAR | Status: DC
Start: 2017-08-16 — End: 2017-08-15

## 2017-08-15 MED ORDER — VASOPRESSIN 20 UNIT/ML IV SOLN
0.0400 [IU]/min | INTRAVENOUS | Status: DC
  Filled 2017-08-15 (×5): qty 2

## 2017-08-15 MED ORDER — NITROGLYCERIN IN D5W 200-5 MCG/ML-% IV SOLN
0.0000 ug/min | INTRAVENOUS | Status: DC
  Filled 2017-08-15: qty 250

## 2017-08-15 MED ORDER — CHLORHEXIDINE GLUCONATE 0.12 % MT SOLN
15.0000 mL | Freq: Once | OROMUCOSAL | Status: AC
  Administered 2017-08-16: 15 mL via OROMUCOSAL
  Filled 2017-08-15: qty 15

## 2017-08-15 MED ORDER — TRANEXAMIC ACID 1000 MG/10ML IV SOLN: 1.5000 mg/kg/h | INTRAVENOUS | Status: DC

## 2017-08-15 MED ORDER — CHLORHEXIDINE GLUCONATE CLOTH 2 % EX PADS
6.0000 | MEDICATED_PAD | Freq: Once | CUTANEOUS | Status: AC
  Administered 2017-08-15: 6 via TOPICAL

## 2017-08-15 MED ORDER — POTASSIUM CHLORIDE 2 MEQ/ML IV SOLN
80.0000 meq | INTRAVENOUS | Status: DC
  Filled 2017-08-15: qty 40

## 2017-08-15 MED ORDER — NOREPINEPHRINE 4 MG/250ML-% IV SOLN: 0.0000 ug/min | INTRAVENOUS | Status: DC

## 2017-08-15 MED ORDER — EPINEPHRINE PF 1 MG/ML IJ SOLN: 0.0000 ug/min | INTRAVENOUS | Status: DC

## 2017-08-15 MED ORDER — DEXMEDETOMIDINE HCL IN NACL 400 MCG/100ML IV SOLN: 0.1000 ug/kg/h | INTRAVENOUS | Status: DC

## 2017-08-15 MED ORDER — SODIUM CHLORIDE 0.9 % IV SOLN
INTRAVENOUS | Status: DC
  Filled 2017-08-15: qty 30

## 2017-08-15 MED ORDER — NOREPINEPHRINE 4 MG/250ML-% IV SOLN
4.0000 ug/min | INTRAVENOUS | Status: DC
  Administered 2017-08-15 – 2017-08-16 (×2): 4 ug/min via INTRAVENOUS
  Filled 2017-08-15 (×3): qty 250

## 2017-08-15 MED ORDER — SODIUM CHLORIDE 0.9 % IV SOLN
INTRAVENOUS | Status: AC
  Administered 2017-08-16: 4.1 [IU]/h via INTRAVENOUS
  Filled 2017-08-15: qty 1

## 2017-08-15 MED ORDER — TRANEXAMIC ACID 1000 MG/10ML IV SOLN
1.5000 mg/kg/h | INTRAVENOUS | Status: AC
  Administered 2017-08-16: 1.5 mg/kg/h via INTRAVENOUS
  Filled 2017-08-15 (×3): qty 25

## 2017-08-15 NOTE — Progress Notes (Addendum)
ANTICOAGULATION CONSULT NOTE - [place] for heparin Indication: atrial fibrillation  Allergies  Allergen Reactions  . Penicillins Hives, Itching and Other (See Comments)    Has patient had a PCN reaction causing immediate rash, facial/tongue/throat swelling, SOB or lightheadedness with hypotension: Yes Has patient had a PCN reaction causing severe rash involving mucus membranes or skin necrosis: No Has patient had a PCN reaction that required hospitalization: No Has patient had a PCN reaction occurring within the last 10 years: No If all of the above answers are "NO", then may proceed with Cephalosporin use.     Patient Measurements: Height: 5' 10.98" (180.3 cm) Weight: 246 lb 14.6 oz (112 kg) IBW/kg (Calculated) : 75.26 Heparin Dosing Weight: 98.3 kg  Vital Signs: Temp: 97.9 F (36.6 C) (06/16 1107) BP: 104/67 (06/16 1100) Pulse Rate: 69 (06/16 1107)  Labs: Recent Labs    08/13/17 0322  08/13/17 1017 08/13/17 2036 08/14/17 0530  08/14/17 1958 08/15/17 0411 08/15/17 1115  HGB  --    < > 10.5*  --  10.0*  --   --  10.6*  --   HCT  --   --  34.1*  --  31.8*  --   --  33.6*  --   PLT  --   --  185  --  174  --   --  197  --   APTT  --   --   --   --  138*   < > 114* 54* 70*  LABPROT  --   --   --   --  21.8*  --   --   --   --   INR  --   --   --   --  1.92  --   --   --   --   HEPARINUNFRC  --   --   --  1.98*  --   --   --  0.48  --   CREATININE 1.22  --   --   --  1.20  --   --  1.40*  --    < > = values in this interval not displayed.    Estimated Creatinine Clearance: 65.2 mL/min (A) (by C-G formula based on SCr of 1.4 mg/dL (H)).   Medical History: Past Medical History:  Diagnosis Date  . AICD (automatic cardioverter/defibrillator) present 02/05/2014   Upgrade to Medtronic biventricular ICD, serial number  [id number]   . Atrial flutter ([place]) 04/2012   s/p TEE-EPS+RFCA 04/2012  . CAD (coronary artery disease) 2202,5427 X 2    RCA-T, 70% PL (off CFX), 99% Prox LAD/90% Dist LAD, S/P TAXUS stent x 2  . CHF (congestive heart failure) ([place])   . Chronic anticoagulation   . Chronic systolic heart failure ([place])   . CKD (chronic kidney disease)   . Diabetic retinopathy ([place])   . DM type 2 (diabetes mellitus, type 2) ([facility])    insulin dependent  . HTN (hypertension)   . Hypercholesteremia    ablation  . ICD (implantable cardiac defibrillator) in place   . Ischemic cardiomyopathy March 2015   20-25% 2D   . Nephrolithiasis   . Ventricular tachycardia ([facility])     Medications:  Scheduled:  . atorvastatin  80 mg Oral Daily  . digoxin  0.125 mg Oral QHS  . furosemide  80 mg Intravenous BID  . gabapentin  300 mg Oral TID  . insulin aspart  0-15 Units Subcutaneous TID  WC  . insulin aspart  0-5 Units Subcutaneous QHS  . insulin aspart  4 Units Subcutaneous TID WC  . losartan  12.5 mg Oral QHS  . magnesium oxide  400 mg Oral Daily  . pneumococcal 23 valent vaccine  0.5 mL Intramuscular Tomorrow-1000  . potassium chloride  40 mEq Oral BID  . sodium chloride flush  10-40 mL Intracatheter Q12H  . sodium chloride flush  3 mL Intravenous Q12H  . sodium chloride flush  3 mL Intravenous Q12H  . sodium chloride flush  3 mL Intravenous Q12H  . sotalol  120 mg Oral BID    Assessment: 67 yo male on chronic apixaban, now admitted for planned LVAD on Monday.  Last apixaban dose 6/14 am.  Pharmacy asked to start IV heparin since apixaban stopped for VAD. Heparin level still affected slightly by DOAC, aPTT therapeutic at 70 seconds.  Goal of Therapy:  Heparin level 0.3-0.7 units/ml  APTT 66-102 seconds Monitor platelets by anticoagulation protocol: Yes   Plan:  -Continue heparin 900 units/hr -Daily aPTT/heparin level and CBC -Planning for LVAD tomorrow, stop heparin on-call to OR  Arrie Senate, PharmD, BCPS PGY-2 Cardiology Pharmacy Resident Phone: [phone] 08/15/2017

## 2017-08-15 NOTE — Progress Notes (Signed)
ANTICOAGULATION CONSULT NOTE - Follow Up Consult  Pharmacy Consult for heparin Indication: atrial fibrillation  Labs: Recent Labs    08/13/17 0322 08/13/17 1017 08/13/17 2036  08/14/17 0530 08/14/17 1255 08/14/17 1958 08/15/17 0411  HGB  --  10.5*  --   --  10.0*  --   --  10.6*  HCT  --  34.1*  --   --  31.8*  --   --  33.6*  PLT  --  185  --   --  174  --   --  197  APTT  --   --   --    < > 138* 111* 114* 54*  LABPROT  --   --   --   --  21.8*  --   --   --   INR  --   --   --   --  1.92  --   --   --   HEPARINUNFRC  --   --  1.98*  --   --   --   --  0.48  CREATININE 1.22  --   --   --  1.20  --   --  1.40*   < > = values in this interval not displayed.    Assessment: 67yo male now subtherapeutic on heparin after rate changes.  Goal of Therapy:  aPTT 66-102 seconds   Plan:  Will increase heparin gtt by 1-2 units/kg/hr to 900 units/hr and check PTT in 6 hours.    Wynona Neat, PharmD, BCPS  08/15/2017,5:30 AM

## 2017-08-15 NOTE — Progress Notes (Signed)
Initial LVAD Psychosocial assessment completed on 05/10/17. CSW met with patient and his daughter Anson Crofts at bedside today to review previous assessment. CSW completed PHQ-2 at bedside today and patient scored a 0. Patient spoke about his journey and states he is ready to get this done. Patient has not completed an Advanced Directive but noted his wife is aware of his goals of care. No changes noted from initial assessment. Patient is a excellent candidate for LVAD implantation.  Clinical Interventions Needed:    CSW will monitor signs and symptoms of depression and assist with adjustment to life with an LVAD. CSW encouraged attendance with the LVAD Support Group to assist further with adjustment and post implant peer support.  Clinical Impressions/Recommendations:   Mr. Mcshan is a 67yo male who is married and has 3 adult children. Patient reports he is compliant with his medical regimen and states he has goals of care although does not wish to complete an AD. He reports his wife will be primary caregiver and his 3 children along with his niece will be the back up caregivers. Patient resides in his own home and denies any concerns with his home environment. He did mention that his son smokes outside and often the smoke carries in with him. He does not currently have an home care services. He is active with his church and reports he enjoys hunting and sports. 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 He denies any financial issues at this time. He admits to past usage with tobacco use but quit at age 50. He also admits to past usage with alcohol but was a weekend social drinker and hasn't had a drink in years. He denies ever using any illegal substances. He denies any depression and anxiety but states he takes  Cymbalta for neuropathy. He scored a 0 on the PHQ-2 and states he "rolls with it" to cope with stressors.  He hopes to "get back out there" after VAD implant. Patient appears to have a good support system and a positive outlook on life and VAD implantation. Patient appears to be a excellent candidate for LVAD implantation.  Melba Coon , [place]

## 2017-08-15 NOTE — Consult Note (Signed)
Anesthesiology Pre-operative Note:  67 year old male with end-stage ischemic cardiomyopathy scheduled to undergo HM 3 LVAD implantation tomorrow for destination therapy.  1. Ischemic Cardiomyopathy: EF 20-25% S/P Taxus stenting of LAD, RCA, L. Cx 2002, 2006.  2. CHF: Admitted 6/12 for initiation of milrinone. Moderate RV dysfunction noted on Echo 11/19/16. Hemodynamics improved on milrinone and levophed 3. A. Fib: S/P A. Flutter Ablation on 04/2012. Has been in AFib since 07/15/17. Eliquis stopped,  now on heparin. 4. VT: Medtronic BiV AICD inserted 2007, upgraded on 02/05/14. No shocks since new device placed. 5. Type 2 DM Hgb A1C 8.4 6/15 6. CKD II-III  baseline Cr. 1.35-1.40 range 7. S/P fracture R. Foot 12/18 now healed 8.  Diabetic neuropathy  Milrinone 0.375 MCG/Kg/Min and levophed 5 mcg/kg/min  Studies:  Cath 02/18/17 RCA chronically occluded. LAD, LCx OK  Echo 08/13/17:  Ejection fraction was in the range of 20% to 25%.   Akinesis of the apicalanteroseptal, inferoseptal, and apical   myocardium. Dyskinesis of the inferior myocardium. LVEDD: 58 mm - Mitral valve: There was mild regurgitation.   Aortic Valve: No stenosis   Right ventricle:  The cavity size was normal. Pacer wire or   catheter noted in right ventricle. Systolic function was normal.  Swan numbers 6/16: RA: 7 PA 42/23 (31) PCWP 19 CO/CI 4.2/1.8   CoX 6/16: 51% Na -134 K-4.0 glucose- 252 BUN/Cr 20/1.36 H/H- 10.6/33.6 Platelets- 197,000 WBC- 5,000  CXR: L. AICD in place , L. IJ Swan in PA, R. Subclavian central line Mild pulmonary congestion moderate cardiomegaly  Exam : Pleasant AA male in NAD VS: T- 37.2 BP- 138/104 HR- 91 RR- 19 O2 sat 97% on RA Edentulous, opens mouth well Heart RRR Lungs-clear no crackles  Impression: 67 year old male with end-stage ischemic cardiomyopathy, for HM 3 LVAD tomorrow for destination therapy. He appears to be in acceptable condition for the procedure tomorrow. Anesthetic plan  discussed with Mr. Stroble and his daughter. I explained that he will remain on the ventilator at least overnight following the surgery.   Roberts Gaudy, MD

## 2017-08-15 NOTE — Consult Note (Signed)
Consultation Note Date: 08/15/2017   Patient Name: [name] [name]  DOB: December 06, 1950  MRN: [id number]  Age / Sex: 67 y.o., male  PCP: [name], NP Referring Physician: Jolaine Artist, MD  Reason for Consultation: VAD eval  HPI/Patient Profile: 67 y.o. male  with past medical history of systolic CHF EF 47-09%, CAD, s/p Medtronic AICD, [name], s/p ablation d/t atrial flutter, HTN, HLD, diabetes mellitus 2, CKD stage 2-3,  admitted on 08/11/2017 with acute on chronic systolic heart failure and initiation of milrinone. Now continuing VAD work up with likely VAD implant tomorrow 08/16/17.   Clinical Assessment and Goals of Care: I met today with [name] [name] and his wife at bedside. They are both in good spirits. They are preparing for LVAD implant tomorrow. They are knowledgeable about the procedure, recovery, complications, as well as caregiver responsibilities. They share that they have known that LVAD was a possibility for a while now but [name] [name] says that it still seemed to come very quickly. They are processing all the information being provided and seem to be doing very well.  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 He would like to resume supervision of small projects occasionally if he is able. He is hopeful for improved QOL which includes being as independent as possible and does not want to burden his wife. They have been married 23 yrs and have 3 children and a goddaughter that are available to support them. Wife will be main caregiver but plans to be present during recovery. He has Theme park manager and church support and friends. Wife mentions that the biggest problem might be too many people and too much support initially. We discussed the importance of allowing rest during recovery.   [name]' only concern is for his wife. He became tearful and we both  reassured him. His wife reassures him that she can handle this and that the goal is to get him feeling better and more independent. They are very supportive of each other. Wife says she has no concerns. We discussed post-op issues such as pain, insomnia, and depression. He is not concerned about these and says that he does not feel he would have any issues with depression. I encouraged him to make the team aware of any issues he is having so we can aide in his recovery.   Emotional support provided.   Primary Decision Maker PATIENT    SUMMARY OF RECOMMENDATIONS   - Appears to be highly motivated with excellent support system - Good LVAD candidate from palliative viewpoint  Code Status/Advance Care Planning:  Full code   Symptom Management:   Per heart failure team.   Palliative Prophylaxis:   Bowel Regimen, Delirium Protocol and Frequent Pain Assessment  Additional Recommendations (Limitations, Scope, Preferences):  Full Scope Treatment  Psycho-social/Spiritual:   Desire for further Chaplaincy support:yes  Additional Recommendations: Caregiving  Support/Resources  Prognosis:   Unable to determine  Discharge Planning: To Be Determined  Primary Diagnoses: Present on Admission: . Acute on chronic systolic (congestive) heart failure ([place])   I have reviewed the medical record, interviewed the patient and family, and examined the patient. The following aspects are pertinent.  Past Medical History:  Diagnosis Date  . AICD (automatic cardioverter/defibrillator) present 02/05/2014   Upgrade to Medtronic biventricular ICD, serial number  [id number]   . Atrial flutter ([place]) 04/2012   s/p TEE-EPS+RFCA 04/2012  . CAD (coronary artery disease) 3762,8315 X 2    RCA-T, 70% PL (off CFX), 99% Prox LAD/90% Dist LAD, S/P TAXUS stent x 2  . CHF (congestive heart failure) ([place])   . Chronic anticoagulation   . Chronic systolic heart failure ([place])   . CKD (chronic kidney  disease)   . Diabetic retinopathy ([place])   . DM type 2 (diabetes mellitus, type 2) ([facility])    insulin dependent  . HTN (hypertension)   . Hypercholesteremia    ablation  . ICD (implantable cardiac defibrillator) in place   . Ischemic cardiomyopathy March 2015   20-25% 2D   . Nephrolithiasis   . Ventricular tachycardia ([facility])    Social History   Socioeconomic History  . Marital status: Married    Spouse name: Not on file  . Number of children: 4  . Years of education: 73  . Highest education level: Not on file  Occupational History  . Occupation: Psychiatrist - currently unemployed  Social Needs  . Financial resource strain: Not very hard  . Food insecurity:    Worry: Never true    Inability: Never true  . Transportation needs:    Medical: No    Non-medical: No  Tobacco Use  . Smoking status: Former Smoker    Packs/day: 1.00    Years: 29.00    Pack years: 29.00    Types: Cigarettes    Last attempt to quit: 03/02/2000    Years since quitting: 17.4  . Smokeless tobacco: Never Used  Substance and Sexual Activity  . Alcohol use: No    Alcohol/week: 0.0 oz  . Drug use: No  . Sexual activity: Yes    Partners: Female    Birth control/protection: None  Lifestyle  . Physical activity:    Days per week: Patient refused    Minutes per session: Patient refused  . Stress: Only a little  Relationships  . Social connections:    Talks on phone: Patient refused    Gets together: Patient refused    Attends religious service: Patient refused    Active member of club or organization: Patient refused    Attends meetings of clubs or organizations: Patient refused    Relationship status: Patient refused  Other Topics Concern  . Not on file  Social History Narrative   Pt lives with wife in a one story home - married for 4 years. He hunts regularly and is active, walking > 1 mile at times without difficulty.  Has 4 children.     Retired Horticulturist, commercial.  Education: some college.    Family History  Problem Relation Age of Onset  . Heart failure Father        Deceased  . Alzheimer's disease Mother        Living  . Heart attack Mother   . CAD Brother   . Other Brother        blood cancer  . CAD Brother   . Prostate cancer Brother   . CAD Brother   . CAD Brother   .  Healthy Son   . Healthy Daughter   . Diabetes Brother   . Stomach cancer Neg Hx   . Colon cancer Neg Hx    Scheduled Meds: . atorvastatin  80 mg Oral Daily  . digoxin  0.125 mg Oral QHS  . furosemide  80 mg Intravenous BID  . gabapentin  300 mg Oral TID  . insulin aspart  0-15 Units Subcutaneous TID WC  . insulin aspart  0-5 Units Subcutaneous QHS  . insulin aspart  4 Units Subcutaneous TID WC  . losartan  12.5 mg Oral QHS  . magnesium oxide  400 mg Oral Daily  . pneumococcal 23 valent vaccine  0.5 mL Intramuscular Tomorrow-1000  . potassium chloride  40 mEq Oral BID  . sodium chloride flush  10-40 mL Intracatheter Q12H  . sodium chloride flush  3 mL Intravenous Q12H  . sodium chloride flush  3 mL Intravenous Q12H  . sodium chloride flush  3 mL Intravenous Q12H  . sotalol  120 mg Oral BID   Continuous Infusions: . sodium chloride    . sodium chloride    . sodium chloride 10 mL/hr at 08/15/17 0700  . heparin 900 Units/hr (08/15/17 0700)  . milrinone 0.5 mcg/kg/min (08/15/17 0700)   PRN Meds:.sodium chloride, sodium chloride, acetaminophen, ondansetron (ZOFRAN) IV, sodium chloride flush, sodium chloride flush, sodium chloride flush, sodium chloride flush Allergies  Allergen Reactions  . Penicillins Hives, Itching and Other (See Comments)    Has patient had a PCN reaction causing immediate rash, facial/tongue/throat swelling, SOB or lightheadedness with hypotension: Yes Has patient had a PCN reaction causing severe rash involving mucus membranes or skin necrosis: No Has patient had a PCN reaction that required hospitalization: No Has patient had a PCN reaction occurring within the  last 10 years: No If all of the above answers are "NO", then may proceed with Cephalosporin use.    Review of Systems  Constitutional: Positive for activity change and fatigue. Negative for appetite change.  Respiratory: Negative for shortness of breath.   Neurological: Positive for weakness.    Physical Exam  Constitutional: He is oriented to person, place, and time. He appears well-developed.  HENT:  Head: Normocephalic and atraumatic.  Cardiovascular: Normal rate and regular rhythm.  Pulmonary/Chest: Effort normal. No accessory muscle usage. No tachypnea. No respiratory distress.  Abdominal: Normal appearance.  Neurological: He is alert and oriented to person, place, and time.  Nursing note and vitals reviewed.   Vital Signs: BP (!) 98/59   Pulse 78   Temp 98.8 F (37.1 C)   Resp 17   Ht 5' 10.98" (1.803 m)   Wt 112 kg (246 lb 14.6 oz)   SpO2 91%   BMI 34.45 kg/m  Pain Scale: 0-10   Pain Score: 0-No pain   SpO2: SpO2: 91 % O2 Device:SpO2: 91 % O2 Flow Rate: .O2 Flow Rate (L/min): 1 L/min  IO: Intake/output summary:   Intake/Output Summary (Last 24 hours) at 08/15/2017 0914 Last data filed at 08/15/2017 0700 Gross per 24 hour  Intake 1418.65 ml  Output 3601 ml  Net -2182.35 ml    LBM: Last BM Date: 08/11/17 Baseline Weight: Weight: 113.2 kg (249 lb 8 oz) Most recent weight: Weight: 112 kg (246 lb 14.6 oz)     Palliative Assessment/Data: 70%     Time Total: 50 min  Greater than 50%  of this time was spent counseling and coordinating care related to the above assessment and plan.  Signed by:  [name], NP Palliative Medicine Team Pager # [phone] (M-F 8a-5p) Team Phone # [phone] (Nights/Weekends)

## 2017-08-15 NOTE — Progress Notes (Signed)
Advanced Heart Failure Rounding Note  PCP-Cardiologist: No primary care provider on file.   Subjective:     Admitted for PICC and initiation of milrinone. Noted to be in Afib since May 16th.   Co-ox was low on milrinone 0.25. Milrinone increased to 0.375.  Taken to cath lab 6/14 for RHC and possible IABP. Swan numbers improved on milrinone 0.375 so IABP not placed.   Yesterday milrinone increased to 0.5. Co-ox 60% this am. Repeat 50%. Feels ok. Denies SOB, orthopnea or PND. On heparin gtt. No bleeding.   Swan #s - done personally RA 7 PA 42/23 (31) PCWP 19 Thermo 4.2/1.8 PAPI = 2.7  Objective:   Weight Range: 112 kg (246 lb 14.6 oz) Body mass index is 34.45 kg/m.   Vital Signs:   Temp:  [97.5 F (36.4 C)-99 F (37.2 C)] 98.1 F (36.7 C) (06/16 1230) Pulse Rate:  [56-154] 72 (06/16 1230) Resp:  [9-23] 18 (06/16 1230) BP: (83-133)/(52-87) 110/56 (06/16 1230) SpO2:  [90 %-99 %] 95 % (06/16 1230) Weight:  [112 kg (246 lb 14.6 oz)] 112 kg (246 lb 14.6 oz) (06/16 0600) Last BM Date: 08/11/17  Weight change: Filed Weights   08/13/17 1823 08/14/17 0630 08/15/17 0600  Weight: 108.1 kg (238 lb 5.1 oz) 108.4 kg (238 lb 15.7 oz) 112 kg (246 lb 14.6 oz)    Intake/Output:   Intake/Output Summary (Last 24 hours) at 08/15/2017 1241 Last data filed at 08/15/2017 1200 Gross per 24 hour  Intake 1725.25 ml  Output 4501 ml  Net -2775.75 ml      Physical Exam    General:  Sitting in bed  No resp difficulty HEENT: normal Neck: supple. LIJ swan Carotids 2+ bilat; no bruits. No lymphadenopathy or thryomegaly appreciated. Cor: PMI laterally displaced. Regular rate & rhythm. +s3 Lungs: clear Abdomen: obese soft, nontender, nondistended. No hepatosplenomegaly. No bruits or masses. Good bowel sounds. Extremities: no cyanosis, clubbing, rash, edema Neuro: alert & orientedx3, cranial nerves grossly intact. moves all 4 extremities w/o difficulty. Affect pleasan   Telemetry    AF underlying. V paced 60-70s. Personally reviewed.   Labs    CBC Recent Labs    08/14/17 0530 08/15/17 0411  WBC 4.6 5.0  HGB 10.0* 10.6*  HCT 31.8* 33.6*  MCV 86.2 85.3  PLT 174 664   Basic Metabolic Panel Recent Labs    08/14/17 0530 08/15/17 0411  NA 136 133*  K 3.9 3.8  CL 105 102  CO2 25 25  GLUCOSE 171* 215*  BUN 19 20  CREATININE 1.20 1.40*  CALCIUM 8.5* 8.5*   Liver Function Tests No results for input(s): AST, ALT, ALKPHOS, BILITOT, PROT, ALBUMIN in the last 72 hours. No results for input(s): LIPASE, AMYLASE in the last 72 hours. Cardiac Enzymes No results for input(s): CKTOTAL, CKMB, CKMBINDEX, TROPONINI in the last 72 hours.  BNP: BNP (last 3 results) Recent Labs    11/18/16 1355 01/15/17 0911 08/11/17 1438  BNP 584.3* 159.6* 529.3*    ProBNP (last 3 results) Recent Labs    11/12/16 1559  PROBNP 388.0*     D-Dimer No results for input(s): DDIMER in the last 72 hours. Hemoglobin A1C Recent Labs    08/14/17 0530  HGBA1C 8.4*   Fasting Lipid Panel No results for input(s): CHOL, HDL, LDLCALC, TRIG, CHOLHDL, LDLDIRECT in the last 72 hours. Thyroid Function Tests No results for input(s): TSH, T4TOTAL, T3FREE, THYROIDAB in the last 72 hours.  Invalid input(s): FREET3  Other  results:   Imaging    No results found.   Medications:     Scheduled Medications: . atorvastatin  80 mg Oral Daily  . digoxin  0.125 mg Oral QHS  . furosemide  80 mg Intravenous BID  . gabapentin  300 mg Oral TID  . insulin aspart  0-15 Units Subcutaneous TID WC  . insulin aspart  0-5 Units Subcutaneous QHS  . insulin aspart  4 Units Subcutaneous TID WC  . losartan  12.5 mg Oral QHS  . magnesium oxide  400 mg Oral Daily  . pneumococcal 23 valent vaccine  0.5 mL Intramuscular Tomorrow-1000  . potassium chloride  40 mEq Oral BID  . sodium chloride flush  10-40 mL Intracatheter Q12H  . sodium chloride flush  3 mL Intravenous Q12H  . sodium chloride  flush  3 mL Intravenous Q12H  . sodium chloride flush  3 mL Intravenous Q12H  . sotalol  120 mg Oral BID    Infusions: . sodium chloride    . sodium chloride    . sodium chloride 10 mL/hr at 08/15/17 1200  . heparin 900 Units/hr (08/15/17 0700)  . milrinone 0.5 mcg/kg/min (08/15/17 1209)    PRN Medications: sodium chloride, sodium chloride, acetaminophen, ondansetron (ZOFRAN) IV, sodium chloride flush, sodium chloride flush, sodium chloride flush, sodium chloride flush    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   Directly admitted with persistent low cardiac output for milrinone initiation for home.  Assessment/Plan  1. Acute/Chronic systolic CHF: - Echo  7/82/9562 EF 20-25%. He has Medtronic BiV ICD in place.  - Cath 12/18 with stable 1v CAD.  - CPX testing 12/18 with submax test but severe HF limitation with exertional hypotension. Peak VO2: 9.3 (41% predicted peak VO2). VE/VCO2 slope: 53 - Recent  RHC on 5/23 with low output CO 3.5 /CI 1.6  - PYP scan suggestive of TTR amyloid. - Hemodynamics improved this am on milrinone 0.5. Creatinine slightly worse. Will continue current regimen. Hold afternoon lasix. - I discussed case with Dr. Posey Pronto at [facility]. With blood type O doubt he will be able to make it to transplant. Plan VAD suppor . D/w Dr. Prescott Gum -Off b-blocker due to low output and low BP and bradycardia -Continue digoxin 0.125 - On sotalol for VT. Will stop now with VAD going in   2. [name]  - recent cath with stable CAD as above - off apixaban. Resume ASA 81  3. [name] - Recent A1c 8.3 on 6/3.   Cover with SSI in house.  -In the community he is followed by Endocrinology.   4. CKD II-III Creatinine baseline 1.2-1.4  Creatinine 1.40 today. Follow closely  5.  [name] - s/p ICD. Quiescent.  - Stop sotalol - Keep K>= 4.0 Mg >= 2.0  6. PAF s/p ablation 04/2012 - Device interrogation showed A fib since May 16th.  - On Eliquis at home. Now covering with heparin   CRITICAL CARE Performed by: Glori Bickers  Total critical care time: 35 minutes  Critical care time was exclusive of separately billable procedures and treating other patients.  Critical care was necessary to treat or prevent imminent or life-threatening deterioration.  Critical care was time spent personally by me (independent  of midlevel providers or residents) on the following activities: development of treatment plan with patient and/or surrogate as well as nursing, discussions with consultants, evaluation of patient's response to treatment, examination of patient, obtaining history from patient or surrogate, ordering and performing treatments and interventions, ordering and review of laboratory studies, ordering and review of radiographic studies, pulse oximetry and re-evaluation of patient's condition.    Length of Stay: Gray, MD  08/15/2017, 12:41 PM  Advanced Heart Failure Team Pager [phone] (M-F; 7a - 4p)  Please contact [facility] for night-coverage after hours (4p -7a ) and weekends on [email or web address]

## 2017-08-16 ENCOUNTER — Inpatient Hospital Stay (HOSPITAL_COMMUNITY): Payer: PPO

## 2017-08-16 ENCOUNTER — Encounter (HOSPITAL_COMMUNITY): Payer: Self-pay | Admitting: Internal Medicine

## 2017-08-16 ENCOUNTER — Inpatient Hospital Stay (HOSPITAL_COMMUNITY)
Admission: AD | Disposition: A | Discharge status: Rehabilitation Facility | Payer: Self-pay | Source: Home / Self Care | Attending: Internal Medicine

## 2017-08-16 ENCOUNTER — Inpatient Hospital Stay (HOSPITAL_COMMUNITY): Payer: PPO | Admitting: Anesthesiology

## 2017-08-16 DIAGNOSIS — Z95811 Presence of heart assist device: Secondary | ICD-10-CM

## 2017-08-16 HISTORY — PX: TRICUSPID VALVE REPLACEMENT: SHX816

## 2017-08-16 HISTORY — PX: TEE WITHOUT CARDIOVERSION: SHX5443

## 2017-08-16 HISTORY — PX: INSERTION OF IMPLANTABLE LEFT VENTRICULAR ASSIST DEVICE: SHX5866

## 2017-08-16 LAB — POCT I-STAT, CHEM 8
BUN: 15 mg/dL (ref 6–20)
BUN: 12 mg/dL (ref 6–20)
BUN: 13 mg/dL (ref 6–20)
BUN: 13 mg/dL (ref 6–20)
BUN: 11 mg/dL (ref 6–20)
BUN: 12 mg/dL (ref 6–20)
BUN: 10 mg/dL (ref 6–20)
BUN: 9 mg/dL (ref 6–20)
CALCIUM ION: 1.27 mmol/L (ref 1.15–1.40)
CALCIUM ION: 1.19 mmol/L (ref 1.15–1.40)
CALCIUM ION: 1.31 mmol/L (ref 1.15–1.40)
CALCIUM ION: 1.35 mmol/L (ref 1.15–1.40)
CALCIUM ION: 1.4 mmol/L (ref 1.15–1.40)
CHLORIDE: 101 mmol/L (ref 101–111)
CHLORIDE: 100 mmol/L — AB (ref 101–111)
CHLORIDE: 98 mmol/L — AB (ref 101–111)
CHLORIDE: 101 mmol/L (ref 101–111)
CHLORIDE: 100 mmol/L — AB (ref 101–111)
CHLORIDE: 103 mmol/L (ref 101–111)
CREATININE: 0.9 mg/dL (ref 0.61–1.24)
CREATININE: 0.7 mg/dL (ref 0.61–1.24)
CREATININE: 0.7 mg/dL (ref 0.61–1.24)
CREATININE: 0.7 mg/dL (ref 0.61–1.24)
CREATININE: 0.7 mg/dL (ref 0.61–1.24)
CREATININE: 0.7 mg/dL (ref 0.61–1.24)
Calcium, Ion: 1.26 mmol/L (ref 1.15–1.40)
Calcium, Ion: 1.1 mmol/L — ABNORMAL LOW (ref 1.15–1.40)
Calcium, Ion: 1.19 mmol/L (ref 1.15–1.40)
Chloride: 102 mmol/L (ref 101–111)
Chloride: 101 mmol/L (ref 101–111)
Creatinine, Ser: 0.8 mg/dL (ref 0.61–1.24)
Creatinine, Ser: 0.7 mg/dL (ref 0.61–1.24)
GLUCOSE: 267 mg/dL — AB (ref 65–99)
GLUCOSE: 250 mg/dL — AB (ref 65–99)
GLUCOSE: 199 mg/dL — AB (ref 65–99)
GLUCOSE: 183 mg/dL — AB (ref 65–99)
GLUCOSE: 95 mg/dL (ref 65–99)
Glucose, Bld: 218 mg/dL — ABNORMAL HIGH (ref 65–99)
Glucose, Bld: 168 mg/dL — ABNORMAL HIGH (ref 65–99)
Glucose, Bld: 162 mg/dL — ABNORMAL HIGH (ref 65–99)
HCT: 38 % — ABNORMAL LOW (ref 39.0–52.0)
HCT: 33 % — ABNORMAL LOW (ref 39.0–52.0)
HCT: 30 % — ABNORMAL LOW (ref 39.0–52.0)
HCT: 27 % — ABNORMAL LOW (ref 39.0–52.0)
HCT: 28 % — ABNORMAL LOW (ref 39.0–52.0)
HCT: 28 % — ABNORMAL LOW (ref 39.0–52.0)
HEMATOCRIT: 28 % — AB (ref 39.0–52.0)
HEMATOCRIT: 35 % — AB (ref 39.0–52.0)
Hemoglobin: 12.9 g/dL — ABNORMAL LOW (ref 13.0–17.0)
Hemoglobin: 11.2 g/dL — ABNORMAL LOW (ref 13.0–17.0)
Hemoglobin: 9.5 g/dL — ABNORMAL LOW (ref 13.0–17.0)
Hemoglobin: 10.2 g/dL — ABNORMAL LOW (ref 13.0–17.0)
Hemoglobin: 9.2 g/dL — ABNORMAL LOW (ref 13.0–17.0)
Hemoglobin: 9.5 g/dL — ABNORMAL LOW (ref 13.0–17.0)
Hemoglobin: 11.9 g/dL — ABNORMAL LOW (ref 13.0–17.0)
Hemoglobin: 9.5 g/dL — ABNORMAL LOW (ref 13.0–17.0)
POTASSIUM: 4 mmol/L (ref 3.5–5.1)
POTASSIUM: 3.4 mmol/L — AB (ref 3.5–5.1)
Potassium: 3.9 mmol/L (ref 3.5–5.1)
Potassium: 4.1 mmol/L (ref 3.5–5.1)
Potassium: 3.7 mmol/L (ref 3.5–5.1)
Potassium: 3.3 mmol/L — ABNORMAL LOW (ref 3.5–5.1)
Potassium: 3.2 mmol/L — ABNORMAL LOW (ref 3.5–5.1)
Potassium: 4.2 mmol/L (ref 3.5–5.1)
SODIUM: 140 mmol/L (ref 135–145)
SODIUM: 139 mmol/L (ref 135–145)
Sodium: 137 mmol/L (ref 135–145)
Sodium: 137 mmol/L (ref 135–145)
Sodium: 137 mmol/L (ref 135–145)
Sodium: 138 mmol/L (ref 135–145)
Sodium: 139 mmol/L (ref 135–145)
Sodium: 139 mmol/L (ref 135–145)
TCO2: 23 mmol/L (ref 22–32)
TCO2: 22 mmol/L (ref 22–32)
TCO2: 25 mmol/L (ref 22–32)
TCO2: 23 mmol/L (ref 22–32)
TCO2: 25 mmol/L (ref 22–32)
TCO2: 25 mmol/L (ref 22–32)
TCO2: 23 mmol/L (ref 22–32)
TCO2: 22 mmol/L (ref 22–32)

## 2017-08-16 LAB — POCT I-STAT 3, ART BLOOD GAS (G3+)
ACID-BASE DEFICIT: 4 mmol/L — AB (ref 0.0–2.0)
ACID-BASE DEFICIT: 2 mmol/L (ref 0.0–2.0)
Acid-base deficit: 2 mmol/L (ref 0.0–2.0)
BICARBONATE: 22.3 mmol/L (ref 20.0–28.0)
Bicarbonate: 25.6 mmol/L (ref 20.0–28.0)
Bicarbonate: 25 mmol/L (ref 20.0–28.0)
Bicarbonate: 22.2 mmol/L (ref 20.0–28.0)
Bicarbonate: 23.5 mmol/L (ref 20.0–28.0)
O2 SAT: 100 %
O2 SAT: 100 %
O2 SAT: 95 %
O2 SAT: 100 %
O2 Saturation: 99 %
PCO2 ART: 43.5 mmHg (ref 32.0–48.0)
PCO2 ART: 41.9 mmHg (ref 32.0–48.0)
PO2 ART: 309 mmHg — AB (ref 83.0–108.0)
PO2 ART: 83 mmHg (ref 83.0–108.0)
PO2 ART: 254 mmHg — AB (ref 83.0–108.0)
PO2 ART: 155 mmHg — AB (ref 83.0–108.0)
Patient temperature: 35.6
Patient temperature: 37
TCO2: 27 mmol/L (ref 22–32)
TCO2: 26 mmol/L (ref 22–32)
TCO2: 23 mmol/L (ref 22–32)
TCO2: 25 mmol/L (ref 22–32)
TCO2: 23 mmol/L (ref 22–32)
pCO2 arterial: 43.5 mmHg (ref 32.0–48.0)
pCO2 arterial: 39.2 mmHg (ref 32.0–48.0)
pCO2 arterial: 37.1 mmHg (ref 32.0–48.0)
pH, Arterial: 7.378 (ref 7.350–7.450)
pH, Arterial: 7.412 (ref 7.350–7.450)
pH, Arterial: 7.315 — ABNORMAL LOW (ref 7.350–7.450)
pH, Arterial: 7.351 (ref 7.350–7.450)
pH, Arterial: 7.388 (ref 7.350–7.450)
pO2, Arterial: 286 mmHg — ABNORMAL HIGH (ref 83.0–108.0)

## 2017-08-16 LAB — GLUCOSE, CAPILLARY
GLUCOSE-CAPILLARY: 139 mg/dL — AB (ref 65–99)
GLUCOSE-CAPILLARY: 66 mg/dL (ref 65–99)
GLUCOSE-CAPILLARY: 113 mg/dL — AB (ref 65–99)
GLUCOSE-CAPILLARY: 99 mg/dL (ref 65–99)
GLUCOSE-CAPILLARY: 99 mg/dL (ref 65–99)
GLUCOSE-CAPILLARY: 99 mg/dL (ref 65–99)
GLUCOSE-CAPILLARY: 113 mg/dL — AB (ref 65–99)
Glucose-Capillary: 114 mg/dL — ABNORMAL HIGH (ref 65–99)
Glucose-Capillary: 93 mg/dL (ref 65–99)

## 2017-08-16 LAB — CBC
HCT: 33.8 % — ABNORMAL LOW (ref 39.0–52.0)
HCT: 28.7 % — ABNORMAL LOW (ref 39.0–52.0)
HEMATOCRIT: 35.5 % — AB (ref 39.0–52.0)
HEMOGLOBIN: 11.4 g/dL — AB (ref 13.0–17.0)
Hemoglobin: 10.5 g/dL — ABNORMAL LOW (ref 13.0–17.0)
Hemoglobin: 9.1 g/dL — ABNORMAL LOW (ref 13.0–17.0)
MCH: 27.5 pg (ref 26.0–34.0)
MCH: 27 pg (ref 26.0–34.0)
MCH: 27.4 pg (ref 26.0–34.0)
MCHC: 32.1 g/dL (ref 30.0–36.0)
MCHC: 31.1 g/dL (ref 30.0–36.0)
MCHC: 31.7 g/dL (ref 30.0–36.0)
MCV: 85.5 fL (ref 78.0–100.0)
MCV: 86.9 fL (ref 78.0–100.0)
MCV: 86.4 fL (ref 78.0–100.0)
PLATELETS: 211 10*3/uL (ref 150–400)
Platelets: 120 10*3/uL — ABNORMAL LOW (ref 150–400)
Platelets: 111 10*3/uL — ABNORMAL LOW (ref 150–400)
RBC: 4.15 MIL/uL — AB (ref 4.22–5.81)
RBC: 3.89 MIL/uL — ABNORMAL LOW (ref 4.22–5.81)
RBC: 3.32 MIL/uL — ABNORMAL LOW (ref 4.22–5.81)
RDW: 15.5 % (ref 11.5–15.5)
RDW: 15.6 % — ABNORMAL HIGH (ref 11.5–15.5)
RDW: 15.5 % (ref 11.5–15.5)
WBC: 6.5 10*3/uL (ref 4.0–10.5)
WBC: 7.3 10*3/uL (ref 4.0–10.5)
WBC: 7.2 10*3/uL (ref 4.0–10.5)

## 2017-08-16 LAB — BASIC METABOLIC PANEL
ANION GAP: 8 (ref 5–15)
Anion gap: 6 (ref 5–15)
BUN: 14 mg/dL (ref 6–20)
BUN: 10 mg/dL (ref 6–20)
CALCIUM: 9.1 mg/dL (ref 8.9–10.3)
CO2: 26 mmol/L (ref 22–32)
CO2: 24 mmol/L (ref 22–32)
Calcium: 8.7 mg/dL — ABNORMAL LOW (ref 8.9–10.3)
Chloride: 101 mmol/L (ref 101–111)
Chloride: 107 mmol/L (ref 101–111)
Creatinine, Ser: 1.2 mg/dL (ref 0.61–1.24)
Creatinine, Ser: 0.84 mg/dL (ref 0.61–1.24)
GFR calc Af Amer: >60 mL/min (ref >60)
GFR calc non Af Amer: >60 mL/min (ref >60)
GFR calc non Af Amer: >60 mL/min (ref >60)
GLUCOSE: 267 mg/dL — AB (ref 65–99)
Glucose, Bld: 131 mg/dL — ABNORMAL HIGH (ref 65–99)
Potassium: 4 mmol/L (ref 3.5–5.1)
Potassium: 3.3 mmol/L — ABNORMAL LOW (ref 3.5–5.1)
Sodium: 135 mmol/L (ref 135–145)
Sodium: 137 mmol/L (ref 135–145)

## 2017-08-16 LAB — APTT
aPTT: 62 seconds — ABNORMAL HIGH (ref 24–36)
aPTT: 47 seconds — ABNORMAL HIGH (ref 24–36)

## 2017-08-16 LAB — CREATININE, SERUM
Creatinine, Ser: 0.88 mg/dL (ref 0.61–1.24)
GFR calc Af Amer: >60 mL/min (ref >60)
GFR calc non Af Amer: >60 mL/min (ref >60)

## 2017-08-16 LAB — DIC (DISSEMINATED INTRAVASCULAR COAGULATION)PANEL
D-Dimer, Quant: 2.61 ug/mL-FEU — ABNORMAL HIGH (ref 0.00–0.50)
Fibrinogen: 316 mg/dL (ref 210–475)
INR: 1.73
Platelets: 125 10*3/uL — ABNORMAL LOW (ref 150–400)
Prothrombin Time: 20.1 seconds — ABNORMAL HIGH (ref 11.4–15.2)
Smear Review: NONE SEEN
aPTT: 92 seconds — ABNORMAL HIGH (ref 24–36)

## 2017-08-16 LAB — COOXEMETRY PANEL
Carboxyhemoglobin: 1.8 % — ABNORMAL HIGH (ref 0.5–1.5)
Carboxyhemoglobin: 1.5 % (ref 0.5–1.5)
Methemoglobin: 1.5 % (ref 0.0–1.5)
Methemoglobin: 1.6 % — ABNORMAL HIGH (ref 0.0–1.5)
O2 Saturation: 61.8 %
O2 Saturation: 63.2 %
Total hemoglobin: 9.4 g/dL — ABNORMAL LOW (ref 12.0–16.0)
Total hemoglobin: 10.1 g/dL — ABNORMAL LOW (ref 12.0–16.0)

## 2017-08-16 LAB — PREPARE RBC (CROSSMATCH)

## 2017-08-16 LAB — PROTIME-INR
INR: 1.75
Prothrombin Time: 20.2 seconds — ABNORMAL HIGH (ref 11.4–15.2)

## 2017-08-16 LAB — FIBRINOGEN: Fibrinogen: 392 mg/dL (ref 210–475)

## 2017-08-16 LAB — POCT I-STAT 4, (NA,K, GLUC, HGB,HCT)
Glucose, Bld: 146 mg/dL — ABNORMAL HIGH (ref 65–99)
HCT: 34 % — ABNORMAL LOW (ref 39.0–52.0)
Hemoglobin: 11.6 g/dL — ABNORMAL LOW (ref 13.0–17.0)
Potassium: 3.2 mmol/L — ABNORMAL LOW (ref 3.5–5.1)
Sodium: 140 mmol/L (ref 135–145)

## 2017-08-16 LAB — PLATELET COUNT: Platelets: 124 10*3/uL — ABNORMAL LOW (ref 150–400)

## 2017-08-16 LAB — MAGNESIUM
Magnesium: 1.5 mg/dL — ABNORMAL LOW (ref 1.7–2.4)
Magnesium: 2.3 mg/dL (ref 1.7–2.4)

## 2017-08-16 SURGERY — INSERTION OF IMPLANTABLE LEFT VENTRICULAR ASSIST DEVICE
Anesthesia: General | Site: Chest

## 2017-08-16 MED ORDER — SODIUM CHLORIDE 0.45 % IV SOLN
INTRAVENOUS | Status: DC | PRN
  Administered 2017-08-16 – 2017-08-26 (×7): via INTRAVENOUS

## 2017-08-16 MED ORDER — ETOMIDATE 2 MG/ML IV SOLN
INTRAVENOUS | Status: DC | PRN
  Administered 2017-08-16: 10 mg via INTRAVENOUS

## 2017-08-16 MED ORDER — MIDAZOLAM HCL 2 MG/2ML IJ SOLN
2.0000 mg | INTRAMUSCULAR | Status: DC | PRN
  Administered 2017-08-16 – 2017-08-26 (×62): 2 mg via INTRAVENOUS
  Filled 2017-08-16 (×66): qty 2

## 2017-08-16 MED ORDER — LACTATED RINGERS IV SOLN
INTRAVENOUS | Status: DC | PRN
  Administered 2017-08-16 (×2): via INTRAVENOUS

## 2017-08-16 MED ORDER — CALCIUM CHLORIDE 10 % IV SOLN
1.0000 g | Freq: Once | INTRAVENOUS | Status: AC
  Administered 2017-08-16: 1 g via INTRAVENOUS

## 2017-08-16 MED ORDER — POTASSIUM CHLORIDE 10 MEQ/50ML IV SOLN
10.0000 meq | INTRAVENOUS | Status: AC | PRN
  Administered 2017-08-17 – 2017-08-18 (×3): 10 meq via INTRAVENOUS

## 2017-08-16 MED ORDER — MILRINONE LACTATE IN DEXTROSE 20-5 MG/100ML-% IV SOLN
0.1250 ug/kg/min | INTRAVENOUS | Status: DC
  Filled 2017-08-16: qty 100

## 2017-08-16 MED ORDER — BISACODYL 5 MG PO TBEC
10.0000 mg | DELAYED_RELEASE_TABLET | Freq: Every day | ORAL | Status: DC
  Administered 2017-08-25 – 2017-09-10 (×11): 10 mg via ORAL
  Filled 2017-08-16 (×11): qty 2

## 2017-08-16 MED ORDER — CHLORHEXIDINE GLUCONATE CLOTH 2 % EX PADS
6.0000 | MEDICATED_PAD | Freq: Every day | CUTANEOUS | Status: DC
  Administered 2017-08-17 – 2017-08-26 (×11): 6 via TOPICAL

## 2017-08-16 MED ORDER — DEXTROSE 5 % IV SOLN: 0.5000 ug/min | INTRAVENOUS | Status: DC

## 2017-08-16 MED ORDER — RIFAMPIN 300 MG PO CAPS
600.0000 mg | ORAL_CAPSULE | Freq: Once | ORAL | Status: DC
  Filled 2017-08-16: qty 2

## 2017-08-16 MED ORDER — BISACODYL 10 MG RE SUPP
10.0000 mg | Freq: Every day | RECTAL | Status: DC
  Administered 2017-08-20 – 2017-09-04 (×6): 10 mg via RECTAL
  Filled 2017-08-16 (×7): qty 1

## 2017-08-16 MED ORDER — CALCIUM CHLORIDE 10 % IV SOLN
INTRAVENOUS | Status: DC | PRN
  Administered 2017-08-16: 600 mg via INTRAVENOUS
  Administered 2017-08-16 (×2): 200 mg via INTRAVENOUS

## 2017-08-16 MED ORDER — ALBUMIN HUMAN 5 % IV SOLN
12.5000 g | Freq: Once | INTRAVENOUS | Status: AC
  Administered 2017-08-16: 12.5 g via INTRAVENOUS

## 2017-08-16 MED ORDER — PROTAMINE SULFATE 10 MG/ML IV SOLN
INTRAVENOUS | Status: DC | PRN
  Administered 2017-08-16: 10 mg via INTRAVENOUS
  Administered 2017-08-16: 340 mg via INTRAVENOUS

## 2017-08-16 MED ORDER — LACTATED RINGERS IV SOLN
INTRAVENOUS | Status: DC
  Administered 2017-08-16: 17:00:00 via INTRAVENOUS

## 2017-08-16 MED ORDER — SODIUM CHLORIDE 0.9% FLUSH: 3.0000 mL | INTRAVENOUS | Status: DC | PRN

## 2017-08-16 MED ORDER — SODIUM CHLORIDE 0.9 % IV SOLN
INTRAVENOUS | Status: DC
  Administered 2017-08-16: 2.3 [IU]/h via INTRAVENOUS
  Administered 2017-08-16: 3.9 [IU]/h via INTRAVENOUS
  Filled 2017-08-16 (×2): qty 1

## 2017-08-16 MED ORDER — METOCLOPRAMIDE HCL 5 MG/ML IJ SOLN
10.0000 mg | Freq: Four times a day (QID) | INTRAMUSCULAR | Status: DC
  Administered 2017-08-16 – 2017-09-04 (×72): 10 mg via INTRAVENOUS
  Filled 2017-08-16 (×71): qty 2

## 2017-08-16 MED ORDER — HEMOSTATIC AGENTS (NO CHARGE) OPTIME
TOPICAL | Status: DC | PRN
  Administered 2017-08-16 (×4): 1 via TOPICAL

## 2017-08-16 MED ORDER — VANCOMYCIN HCL IN DEXTROSE 750-5 MG/150ML-% IV SOLN
750.0000 mg | Freq: Two times a day (BID) | INTRAVENOUS | Status: DC
  Administered 2017-08-17: 750 mg via INTRAVENOUS
  Filled 2017-08-16: qty 150

## 2017-08-16 MED ORDER — FLUCONAZOLE IN SODIUM CHLORIDE 400-0.9 MG/200ML-% IV SOLN
400.0000 mg | Freq: Once | INTRAVENOUS | Status: AC
  Administered 2017-08-17: 400 mg via INTRAVENOUS
  Filled 2017-08-16: qty 200

## 2017-08-16 MED ORDER — SODIUM CHLORIDE 0.9 % IV SOLN
Freq: Once | INTRAVENOUS | Status: AC
  Administered 2017-08-16: 23:00:00 via INTRAVENOUS

## 2017-08-16 MED ORDER — ACETAMINOPHEN 500 MG PO TABS: 1000.0000 mg | ORAL_TABLET | Freq: Four times a day (QID) | ORAL | Status: AC

## 2017-08-16 MED ORDER — ASPIRIN 300 MG RE SUPP
300.0000 mg | Freq: Every day | RECTAL | Status: DC
Start: 2017-08-17 — End: 2017-08-23

## 2017-08-16 MED ORDER — FENTANYL CITRATE (PF) 250 MCG/5ML IJ SOLN
INTRAMUSCULAR | Status: DC | PRN
  Administered 2017-08-16: 50 ug via INTRAVENOUS
  Administered 2017-08-16: 250 ug via INTRAVENOUS
  Administered 2017-08-16: 50 ug via INTRAVENOUS
  Administered 2017-08-16: 200 ug via INTRAVENOUS
  Administered 2017-08-16 (×2): 50 ug via INTRAVENOUS

## 2017-08-16 MED ORDER — OXYCODONE HCL 5 MG PO TABS
5.0000 mg | ORAL_TABLET | ORAL | Status: DC | PRN
  Administered 2017-08-29: 10 mg via ORAL
  Filled 2017-08-16: qty 2

## 2017-08-16 MED ORDER — DEXTROSE 5 % IV SOLN
1.0000 ug/min | INTRAVENOUS | Status: DC
  Administered 2017-08-16: 2 ug/min via INTRAVENOUS
  Administered 2017-08-17: 09:00:00 via INTRAVENOUS
  Administered 2017-08-17: 1.5 ug/min via INTRAVENOUS
  Administered 2017-08-17: 4.5 ug/min via INTRAVENOUS
  Administered 2017-08-19: 1.5 ug/min via INTRAVENOUS
  Administered 2017-08-20 – 2017-08-30 (×9): 2 ug/min via INTRAVENOUS
  Filled 2017-08-16 (×13): qty 4

## 2017-08-16 MED ORDER — SODIUM CHLORIDE 0.9 % IR SOLN
Status: DC | PRN
  Administered 2017-08-16: 5000 mL

## 2017-08-16 MED ORDER — PROTAMINE SULFATE 10 MG/ML IV SOLN
INTRAVENOUS | Status: AC
  Filled 2017-08-16: qty 25

## 2017-08-16 MED ORDER — LACTATED RINGERS IV SOLN
INTRAVENOUS | Status: DC
  Administered 2017-08-16: 17:00:00 via INTRAVENOUS
  Administered 2017-08-17: 20 mL via INTRAVENOUS
  Administered 2017-08-20 – 2017-08-26 (×3): via INTRAVENOUS

## 2017-08-16 MED ORDER — MAGNESIUM SULFATE 4 GM/100ML IV SOLN
4.0000 g | Freq: Once | INTRAVENOUS | Status: AC
  Administered 2017-08-16: 4 g via INTRAVENOUS
  Filled 2017-08-16: qty 100

## 2017-08-16 MED ORDER — CHLORHEXIDINE GLUCONATE 0.12% ORAL RINSE (MEDLINE KIT)
15.0000 mL | Freq: Two times a day (BID) | OROMUCOSAL | Status: DC
Start: 2017-08-16 — End: 2017-08-28
  Administered 2017-08-16 – 2017-08-27 (×22): 15 mL via OROMUCOSAL

## 2017-08-16 MED ORDER — DOCUSATE SODIUM 100 MG PO CAPS: 200.0000 mg | ORAL_CAPSULE | Freq: Every day | ORAL | Status: DC

## 2017-08-16 MED ORDER — ACETAMINOPHEN 650 MG RE SUPP
650.0000 mg | Freq: Once | RECTAL | Status: AC
  Administered 2017-08-16: 650 mg via RECTAL

## 2017-08-16 MED ORDER — ASPIRIN EC 325 MG PO TBEC: 325.0000 mg | DELAYED_RELEASE_TABLET | Freq: Every day | ORAL | Status: DC

## 2017-08-16 MED ORDER — LACTATED RINGERS IV SOLN
INTRAVENOUS | Status: DC | PRN
  Administered 2017-08-16: 07:00:00 via INTRAVENOUS

## 2017-08-16 MED ORDER — ONDANSETRON HCL 4 MG/2ML IJ SOLN
4.0000 mg | Freq: Four times a day (QID) | INTRAMUSCULAR | Status: DC | PRN
  Administered 2017-08-27: 4 mg via INTRAVENOUS
  Filled 2017-08-16: qty 2

## 2017-08-16 MED ORDER — VECURONIUM BROMIDE 10 MG IV SOLR
INTRAVENOUS | Status: DC | PRN
  Administered 2017-08-16: 10 mg via INTRAVENOUS
  Administered 2017-08-16 (×3): 5 mg via INTRAVENOUS
  Administered 2017-08-16: 10 mg via INTRAVENOUS
  Administered 2017-08-16: 5 mg via INTRAVENOUS

## 2017-08-16 MED ORDER — SODIUM CHLORIDE 0.9% FLUSH: 3.0000 mL | Freq: Two times a day (BID) | INTRAVENOUS | Status: DC

## 2017-08-16 MED ORDER — DEXMEDETOMIDINE HCL IN NACL 400 MCG/100ML IV SOLN
0.1000 ug/kg/h | INTRAVENOUS | Status: DC
  Administered 2017-08-16 (×2): 0.7 ug/kg/h via INTRAVENOUS
  Administered 2017-08-17: 1.2 ug/kg/h via INTRAVENOUS
  Administered 2017-08-17: 0.7 ug/kg/h via INTRAVENOUS
  Administered 2017-08-17 (×2): 1.2 ug/kg/h via INTRAVENOUS
  Administered 2017-08-17: 0.7 ug/kg/h via INTRAVENOUS
  Administered 2017-08-17 – 2017-08-21 (×30): 1.2 ug/kg/h via INTRAVENOUS
  Administered 2017-08-21: 1 ug/kg/h via INTRAVENOUS
  Administered 2017-08-21 – 2017-08-22 (×3): 1.2 ug/kg/h via INTRAVENOUS
  Administered 2017-08-22: 1.199 ug/kg/h via INTRAVENOUS
  Administered 2017-08-22 – 2017-08-23 (×7): 1.2 ug/kg/h via INTRAVENOUS
  Administered 2017-08-23: 1 ug/kg/h via INTRAVENOUS
  Administered 2017-08-23 – 2017-08-25 (×18): 1.2 ug/kg/h via INTRAVENOUS
  Administered 2017-08-26: 0.8 ug/kg/h via INTRAVENOUS
  Administered 2017-08-26 (×2): 1.2 ug/kg/h via INTRAVENOUS
  Administered 2017-08-26: 0.8 ug/kg/h via INTRAVENOUS
  Administered 2017-08-26: 1.2 ug/kg/h via INTRAVENOUS
  Administered 2017-08-27: 0.8 ug/kg/h via INTRAVENOUS
  Filled 2017-08-16 (×10): qty 100
  Filled 2017-08-16: qty 200
  Filled 2017-08-16 (×9): qty 100
  Filled 2017-08-16 (×2): qty 200
  Filled 2017-08-16 (×15): qty 100
  Filled 2017-08-16: qty 200
  Filled 2017-08-16 (×5): qty 100
  Filled 2017-08-16: qty 200
  Filled 2017-08-16 (×14): qty 100
  Filled 2017-08-16: qty 200
  Filled 2017-08-16 (×11): qty 100

## 2017-08-16 MED ORDER — POTASSIUM CHLORIDE 10 MEQ/50ML IV SOLN
10.0000 meq | INTRAVENOUS | Status: AC
  Administered 2017-08-16 (×3): 10 meq via INTRAVENOUS
  Filled 2017-08-16: qty 50

## 2017-08-16 MED ORDER — SODIUM CHLORIDE 0.9 % IV SOLN
250.0000 mL | INTRAVENOUS | Status: DC
Start: 2017-08-17 — End: 2017-08-26

## 2017-08-16 MED ORDER — DEXMEDETOMIDINE HCL IN NACL 400 MCG/100ML IV SOLN
0.4000 ug/kg/h | INTRAVENOUS | Status: DC
  Filled 2017-08-16: qty 100

## 2017-08-16 MED ORDER — SODIUM CHLORIDE 0.9% FLUSH
10.0000 mL | Freq: Two times a day (BID) | INTRAVENOUS | Status: DC
  Administered 2017-08-17 – 2017-08-19 (×5): 10 mL
  Administered 2017-08-20: 20 mL
  Administered 2017-08-20: 10 mL
  Administered 2017-08-21: 40 mL
  Administered 2017-08-21: 10 mL
  Administered 2017-08-22: 40 mL
  Administered 2017-08-22 – 2017-08-26 (×7): 10 mL

## 2017-08-16 MED ORDER — INSULIN REGULAR BOLUS VIA INFUSION
0.0000 [IU] | Freq: Three times a day (TID) | INTRAVENOUS | Status: DC
  Filled 2017-08-16: qty 10

## 2017-08-16 MED ORDER — FENTANYL CITRATE (PF) 250 MCG/5ML IJ SOLN
INTRAMUSCULAR | Status: AC
  Filled 2017-08-16: qty 20

## 2017-08-16 MED ORDER — ARTIFICIAL TEARS OPHTHALMIC OINT
TOPICAL_OINTMENT | OPHTHALMIC | Status: DC | PRN
  Administered 2017-08-16: 1 via OPHTHALMIC

## 2017-08-16 MED ORDER — ORAL CARE MOUTH RINSE
15.0000 mL | OROMUCOSAL | Status: DC
  Administered 2017-08-16 – 2017-08-28 (×109): 15 mL via OROMUCOSAL

## 2017-08-16 MED ORDER — MIDAZOLAM HCL 5 MG/5ML IJ SOLN
INTRAMUSCULAR | Status: DC | PRN
  Administered 2017-08-16 (×2): 1 mg via INTRAVENOUS
  Administered 2017-08-16: 2 mg via INTRAVENOUS
  Administered 2017-08-16: 4 mg via INTRAVENOUS
  Administered 2017-08-16: 2 mg via INTRAVENOUS

## 2017-08-16 MED ORDER — SODIUM CHLORIDE 0.9% FLUSH: 10.0000 mL | INTRAVENOUS | Status: DC | PRN

## 2017-08-16 MED ORDER — HEPARIN SODIUM (PORCINE) 1000 UNIT/ML IJ SOLN
INTRAMUSCULAR | Status: AC
  Filled 2017-08-16: qty 1

## 2017-08-16 MED ORDER — LACTATED RINGERS IV SOLN: 500.0000 mL | Freq: Once | INTRAVENOUS | Status: DC | PRN

## 2017-08-16 MED ORDER — MORPHINE SULFATE (PF) 2 MG/ML IV SOLN
1.0000 mg | INTRAVENOUS | Status: AC | PRN
  Filled 2017-08-16: qty 2

## 2017-08-16 MED ORDER — ASPIRIN 81 MG PO CHEW
324.0000 mg | CHEWABLE_TABLET | Freq: Every day | ORAL | Status: DC
  Administered 2017-08-17 – 2017-08-23 (×6): 324 mg
  Filled 2017-08-16 (×6): qty 4

## 2017-08-16 MED ORDER — VASOPRESSIN 20 UNIT/ML IV SOLN
0.0300 [IU]/min | INTRAVENOUS | Status: DC
  Filled 2017-08-16: qty 2

## 2017-08-16 MED ORDER — HEPARIN SODIUM (PORCINE) 1000 UNIT/ML IJ SOLN
INTRAMUSCULAR | Status: DC | PRN
  Administered 2017-08-16: 33000 [IU] via INTRAVENOUS

## 2017-08-16 MED ORDER — FAMOTIDINE IN NACL 20-0.9 MG/50ML-% IV SOLN
20.0000 mg | Freq: Two times a day (BID) | INTRAVENOUS | Status: AC
  Administered 2017-08-17: 20 mg via INTRAVENOUS
  Filled 2017-08-16: qty 50

## 2017-08-16 MED ORDER — LEVOFLOXACIN IN D5W 750 MG/150ML IV SOLN
750.0000 mg | INTRAVENOUS | Status: AC
  Administered 2017-08-17 – 2017-08-21 (×5): 750 mg via INTRAVENOUS
  Filled 2017-08-16 (×5): qty 150

## 2017-08-16 MED ORDER — ALBUMIN HUMAN 5 % IV SOLN
INTRAVENOUS | Status: AC
  Administered 2017-08-17: 12.5 g
  Filled 2017-08-16: qty 250

## 2017-08-16 MED ORDER — VANCOMYCIN HCL IN DEXTROSE 1-5 GM/200ML-% IV SOLN: 1000.0000 mg | Freq: Two times a day (BID) | INTRAVENOUS | Status: DC

## 2017-08-16 MED ORDER — MIDAZOLAM HCL 10 MG/2ML IJ SOLN
INTRAMUSCULAR | Status: AC
  Filled 2017-08-16: qty 2

## 2017-08-16 MED ORDER — ACETAMINOPHEN 160 MG/5ML PO SOLN
1000.0000 mg | Freq: Four times a day (QID) | ORAL | Status: AC
  Administered 2017-08-17 – 2017-08-21 (×17): 1000 mg
  Filled 2017-08-16 (×17): qty 40.6

## 2017-08-16 MED ORDER — ALBUMIN HUMAN 5 % IV SOLN
INTRAVENOUS | Status: DC | PRN
  Administered 2017-08-16 (×2): via INTRAVENOUS

## 2017-08-16 MED ORDER — NOREPINEPHRINE 4 MG/250ML-% IV SOLN
0.0000 ug/min | INTRAVENOUS | Status: DC
  Administered 2017-08-16: 4 ug/min via INTRAVENOUS
  Administered 2017-08-17: 1 ug/min via INTRAVENOUS
  Administered 2017-08-17: 3 ug/min via INTRAVENOUS
  Filled 2017-08-16: qty 250

## 2017-08-16 MED ORDER — VANCOMYCIN HCL 1000 MG IV SOLR
INTRAVENOUS | Status: DC | PRN
  Administered 2017-08-16: 1000 mg

## 2017-08-16 MED ORDER — ACETAMINOPHEN 160 MG/5ML PO SOLN: 650.0000 mg | Freq: Once | ORAL | Status: AC

## 2017-08-16 MED ORDER — MORPHINE SULFATE (PF) 2 MG/ML IV SOLN
2.0000 mg | INTRAVENOUS | Status: DC | PRN
  Administered 2017-08-16 – 2017-08-17 (×4): 2 mg via INTRAVENOUS
  Filled 2017-08-16: qty 2

## 2017-08-16 MED ORDER — PROPOFOL 10 MG/ML IV BOLUS
INTRAVENOUS | Status: AC
Start: 2017-08-16 — End: ?
  Filled 2017-08-16: qty 20

## 2017-08-16 MED ORDER — ALBUMIN HUMAN 5 % IV SOLN
250.0000 mL | INTRAVENOUS | Status: AC | PRN
  Administered 2017-08-16 (×4): 250 mL via INTRAVENOUS
  Filled 2017-08-16 (×4): qty 250

## 2017-08-16 MED ORDER — NOREPINEPHRINE 4 MG/250ML-% IV SOLN: 2.0000 ug/min | INTRAVENOUS | Status: DC

## 2017-08-16 MED ORDER — CHLORHEXIDINE GLUCONATE 0.12 % MT SOLN
15.0000 mL | OROMUCOSAL | Status: AC
  Administered 2017-08-16: 15 mL via OROMUCOSAL
  Filled 2017-08-16: qty 15

## 2017-08-16 MED ORDER — PANTOPRAZOLE SODIUM 40 MG PO TBEC: 40.0000 mg | DELAYED_RELEASE_TABLET | Freq: Every day | ORAL | Status: DC

## 2017-08-16 MED ORDER — VECURONIUM BROMIDE 10 MG IV SOLR
INTRAVENOUS | Status: AC
  Filled 2017-08-16: qty 30

## 2017-08-16 MED ORDER — SODIUM CHLORIDE 0.9 % IV SOLN
INTRAVENOUS | Status: DC
  Administered 2017-08-16 – 2017-08-18 (×4): via INTRAVENOUS

## 2017-08-16 MED FILL — Heparin Sod (Porcine)-NaCl IV Soln 1000 Unit/500ML-0.9%: INTRAVENOUS | Qty: 1000 | Status: AC

## 2017-08-16 SURGICAL SUPPLY — 126 items
ADAPTER CARDIO PERF ANTE/RETRO (ADAPTER) IMPLANT
ADPR PRFSN 84XANTGRD RTRGD (ADAPTER)
ANTEGRADE CPLG (MISCELLANEOUS) IMPLANT
APL SRG 7X2 LUM MLBL SLNT (VASCULAR PRODUCTS) ×2
APPLICATOR TIP COSEAL (VASCULAR PRODUCTS) ×2 IMPLANT
BAG DECANTER FOR FLEXI CONT (MISCELLANEOUS) ×4 IMPLANT
BLADE STERNUM SYSTEM 6 (BLADE) ×4 IMPLANT
BLADE SURG 12 STRL SS (BLADE) ×4 IMPLANT
BLADE SURG 15 STRL LF DISP TIS (BLADE) IMPLANT
BLADE SURG 15 STRL SS (BLADE)
BLADE SURG ROTATE 9660 (MISCELLANEOUS) ×2 IMPLANT
CANISTER SUCT 3000ML PPV (MISCELLANEOUS) ×4 IMPLANT
CANN PRFSN 3/8XCNCT ST RT ANG (MISCELLANEOUS) ×2
CANN PRFSN 3/8XRT ANG TPR 14 (MISCELLANEOUS) ×2
CANNULA ARTERIAL NVNT 3/8 20FR (MISCELLANEOUS) ×4 IMPLANT
CANNULA ARTERIAL NVNT 3/8 22FR (MISCELLANEOUS) ×2 IMPLANT
CANNULA PRFSN 3/8XCNCT RT ANG (MISCELLANEOUS) IMPLANT
CANNULA PRFSN 3/8XRT ANG TPR14 (MISCELLANEOUS) IMPLANT
CANNULA SUMP PERICARDIAL (CANNULA) ×2 IMPLANT
CANNULA VEN MTL TIP RT (MISCELLANEOUS) ×8
CANNULA VENOUS LOW PROF 34X46 (CANNULA) ×4 IMPLANT
CANNULA VRC MALB SNGL STG 28FR (MISCELLANEOUS) IMPLANT
CATH FOLEY 2WAY SLVR  5CC 14FR (CATHETERS) ×2
CATH FOLEY 2WAY SLVR 5CC 14FR (CATHETERS) ×2 IMPLANT
CATH HEART VENT LEFT (CATHETERS) IMPLANT
CATH HYDRAGLIDE XL THORACIC (CATHETERS) ×4 IMPLANT
CATH ROBINSON RED A/P 18FR (CATHETERS) ×12 IMPLANT
CATH THORACIC 28FR (CATHETERS) IMPLANT
CATH THORACIC 36FR RT ANG (CATHETERS) ×2 IMPLANT
CHLORAPREP W/TINT 26ML (MISCELLANEOUS) ×4 IMPLANT
CONN 1/2X1/2X1/2  BEN (MISCELLANEOUS) ×2
CONN 1/2X1/2X1/2 BEN (MISCELLANEOUS) IMPLANT
CONN 3/8X1/2 ST GISH (MISCELLANEOUS) ×2 IMPLANT
CONN ST 1/4X3/8  BEN (MISCELLANEOUS) ×2
CONN ST 1/4X3/8 BEN (MISCELLANEOUS) IMPLANT
CONT SPEC 4OZ CLIKSEAL STRL BL (MISCELLANEOUS) ×2 IMPLANT
CRADLE DONUT ADULT HEAD (MISCELLANEOUS) ×4 IMPLANT
DRAIN CHANNEL 28F RND 3/8 FF (WOUND CARE) IMPLANT
DRAIN CHANNEL 32F RND 10.7 FF (WOUND CARE) ×2 IMPLANT
DRAPE BILATERAL SPLIT (DRAPES) ×4 IMPLANT
DRAPE CV SPLIT W-CLR ANES SCRN (DRAPES) ×4 IMPLANT
DRAPE INCISE IOBAN 66X45 STRL (DRAPES) ×8 IMPLANT
DRAPE SLUSH/WARMER DISC (DRAPES) ×4 IMPLANT
DRSG AQUACEL AG ADV 3.5X14 (GAUZE/BANDAGES/DRESSINGS) ×4 IMPLANT
ELECT BLADE 4.0 EZ CLEAN MEGAD (MISCELLANEOUS) ×4
ELECT BLADE 6.5 EXT (BLADE) ×4 IMPLANT
ELECT CAUTERY BLADE 6.4 (BLADE) ×4 IMPLANT
ELECT REM PT RETURN 9FT ADLT (ELECTROSURGICAL) ×4
ELECTRODE BLDE 4.0 EZ CLN MEGD (MISCELLANEOUS) ×2 IMPLANT
ELECTRODE REM PT RTRN 9FT ADLT (ELECTROSURGICAL) ×2 IMPLANT
FELT TEFLON 6X6 (MISCELLANEOUS) ×4 IMPLANT
FLOSEAL 5ML (HEMOSTASIS) ×4 IMPLANT
GAUZE SPONGE 4X4 12PLY STRL (GAUZE/BANDAGES/DRESSINGS) ×6 IMPLANT
GLOVE BIO SURGEON STRL SZ 6.5 (GLOVE) ×6 IMPLANT
GLOVE BIO SURGEON STRL SZ7.5 (GLOVE) ×12 IMPLANT
GLOVE BIO SURGEONS STRL SZ 6.5 (GLOVE) ×6
GOWN STRL REUS W/ TWL LRG LVL3 (GOWN DISPOSABLE) ×8 IMPLANT
GOWN STRL REUS W/ TWL XL LVL3 (GOWN DISPOSABLE) ×4 IMPLANT
GOWN STRL REUS W/TWL LRG LVL3 (GOWN DISPOSABLE) ×32
GOWN STRL REUS W/TWL XL LVL3 (GOWN DISPOSABLE) ×8
HEMOSTAT POWDER SURGIFOAM 1G (HEMOSTASIS) ×16 IMPLANT
HEMOSTAT SURGICEL 2X14 (HEMOSTASIS) IMPLANT
INSERT FOGARTY XLG (MISCELLANEOUS) IMPLANT
KIT BASIN OR (CUSTOM PROCEDURE TRAY) ×4 IMPLANT
KIT CATH SUCT 8FR (CATHETERS) ×2 IMPLANT
KIT LVAD HEARTMATE 3 W-CNTRL (Prosthesis & Implant Heart) ×1 IMPLANT
KIT LVAD HEARTMATE III W-CNTRL (Prosthesis & Implant Heart) ×1 IMPLANT
KIT SUCTION CATH 14FR (SUCTIONS) ×4 IMPLANT
KIT TURNOVER KIT B (KITS) ×4 IMPLANT
LEAD PACING MYOCARDI (MISCELLANEOUS) IMPLANT
NS IRRIG 1000ML POUR BTL (IV SOLUTION) ×20 IMPLANT
PACK OPEN HEART (CUSTOM PROCEDURE TRAY) ×4 IMPLANT
PAD ARMBOARD 7.5X6 YLW CONV (MISCELLANEOUS) ×8 IMPLANT
PAD DEFIB R2 (MISCELLANEOUS) ×4 IMPLANT
PUNCH AORTIC ROTATE 4.0MM (MISCELLANEOUS) ×2 IMPLANT
PUNCH AORTIC ROTATE 4.5MM 8IN (MISCELLANEOUS) ×4 IMPLANT
RING TRICUSPID T30 (Prosthesis & Implant Heart) ×2 IMPLANT
SEALANT SURG COSEAL 8ML (VASCULAR PRODUCTS) ×4 IMPLANT
SET CARDIOPLEGIA MPS 5001102 (MISCELLANEOUS) ×2 IMPLANT
SHEATH AVANTI 11CM 5FR (SHEATH) IMPLANT
SPONGE LAP 18X18 X RAY DECT (DISPOSABLE) ×4 IMPLANT
SUCKER INTRACARDIAC WEIGHTED (SUCKER) ×4 IMPLANT
SUT ETHIBOND 2 0 SH (SUTURE) ×24 IMPLANT
SUT ETHIBOND 2 0 SH 36X2 (SUTURE) ×10 IMPLANT
SUT ETHIBOND 2 0 V4 (SUTURE) ×4 IMPLANT
SUT ETHIBOND 2 0V4 GREEN (SUTURE) ×4 IMPLANT
SUT ETHIBOND NAB MH 2-0 36IN (SUTURE) ×56 IMPLANT
SUT PROLENE 3 0 RB 1 (SUTURE) IMPLANT
SUT PROLENE 3 0 SH DA (SUTURE) ×8 IMPLANT
SUT PROLENE 4 0 RB 1 (SUTURE) ×40
SUT PROLENE 4 0 SH DA (SUTURE) ×8 IMPLANT
SUT PROLENE 4-0 RB1 .5 CRCL 36 (SUTURE) ×8 IMPLANT
SUT PROLENE 5 0 C1 (SUTURE) IMPLANT
SUT PROLENE 6 0 C 1 30 (SUTURE) ×12 IMPLANT
SUT SILK  1 MH (SUTURE) ×10
SUT SILK 1 MH (SUTURE) ×8 IMPLANT
SUT SILK 1 TIES 10X30 (SUTURE) ×4 IMPLANT
SUT SILK 2 0 SH CR/8 (SUTURE) ×8 IMPLANT
SUT SILK 3 0 SH CR/8 (SUTURE) ×2 IMPLANT
SUT STEEL 6MS V (SUTURE) ×10 IMPLANT
SUT STEEL SZ 6 DBL 3X14 BALL (SUTURE) ×4 IMPLANT
SUT TEM PAC WIRE 2 0 SH (SUTURE) IMPLANT
SUT VIC AB 1 CTX 18 (SUTURE) ×4 IMPLANT
SUT VIC AB 1 CTX 27 (SUTURE) ×4 IMPLANT
SUT VIC AB 1 CTX 36 (SUTURE) ×12
SUT VIC AB 1 CTX36XBRD ANBCTR (SUTURE) ×4 IMPLANT
SUT VIC AB 2-0 CTX 27 (SUTURE) ×8 IMPLANT
SUT VIC AB 3-0 SH 8-18 (SUTURE) ×4 IMPLANT
SUT VIC AB 3-0 X1 27 (SUTURE) ×12 IMPLANT
SWAB COLLECTION DEVICE MRSA (MISCELLANEOUS) ×2 IMPLANT
SWAB CULTURE ESWAB REG 1ML (MISCELLANEOUS) ×2 IMPLANT
SYR 50ML LL SCALE MARK (SYRINGE) ×4 IMPLANT
SYSTEM SAHARA CHEST DRAIN ATS (WOUND CARE) ×4 IMPLANT
TAPE CLOTH SURG 4X10 WHT LF (GAUZE/BANDAGES/DRESSINGS) ×2 IMPLANT
TAPE PAPER 2X10 WHT MICROPORE (GAUZE/BANDAGES/DRESSINGS) ×2 IMPLANT
TOWEL GREEN STERILE (TOWEL DISPOSABLE) ×4 IMPLANT
TRAY CATH LUMEN 1 20CM STRL (SET/KITS/TRAYS/PACK) ×4 IMPLANT
TRAY FOLEY SLVR 16FR TEMP STAT (SET/KITS/TRAYS/PACK) ×4 IMPLANT
TUBE CONNECTING 12'X1/4 (SUCTIONS) ×1
TUBE CONNECTING 12X1/4 (SUCTIONS) ×3 IMPLANT
TUBE SUCT INTRACARD DLP 20F (MISCELLANEOUS) ×2 IMPLANT
UNDERPAD 30X30 (UNDERPADS AND DIAPERS) ×4 IMPLANT
VENT LEFT HEART 12002 (CATHETERS)
VRC MALLEABLE SINGLE STG 28FR (MISCELLANEOUS) ×4
WATER STERILE IRR 1000ML POUR (IV SOLUTION) ×8 IMPLANT
YANKAUER SUCT BULB TIP NO VENT (SUCTIONS) ×4 IMPLANT

## 2017-08-16 NOTE — Anesthesia Postprocedure Evaluation (Signed)
Anesthesia Post Note  Patient: DELORES THELEN  Procedure(s) Performed: INSERTION OF IMPLANTABLE LEFT VENTRICULAR ASSIST DEVICE-HM3 (N/A Chest) TRANSESOPHAGEAL ECHOCARDIOGRAM (TEE) (N/A ) TRICUSPID VALVE REPAIR using Edwards MC3 Ring size 30 (N/A Chest)     Patient location during evaluation: SICU Anesthesia Type: General Level of consciousness: sedated and patient remains intubated per anesthesia plan Pain management: pain level controlled Vital Signs Assessment: post-procedure vital signs reviewed and stable Respiratory status: patient remains intubated per anesthesia plan and patient on ventilator - see flowsheet for VS Cardiovascular status: stable Postop Assessment: no apparent nausea or vomiting Anesthetic complications: no    Last Vitals:  Vitals:   08/16/17 1636 08/16/17 1700  BP:  102/82  Pulse:    Resp:    Temp:    SpO2: 100%     Last Pain:  Vitals:   08/15/17 1600  TempSrc:   PainSc: 0-No pain                 Adriel Kessen COKER

## 2017-08-16 NOTE — Progress Notes (Signed)
Pre Procedure note for inpatients:   [name] [name] has been scheduled for Procedure(s): INSERTION OF IMPLANTABLE LEFT VENTRICULAR ASSIST DEVICE-HM3 (N/A) TRANSESOPHAGEAL ECHOCARDIOGRAM (TEE) (N/A) today. The various methods of treatment have been discussed with the patient. After consideration of the risks, benefits and treatment options the patient has consented to the planned procedure.   The patient has been seen and labs reviewed. There are no changes in the patient's condition to prevent proceeding with the planned procedure today.  Recent labs:  Lab Results  Component Value Date   WBC 6.5 08/16/2017   HGB 11.4 (L) 08/16/2017   HCT 35.5 (L) 08/16/2017   PLT 211 08/16/2017   GLUCOSE 267 (H) 08/16/2017   CHOL 116 04/27/2016   TRIG 57 04/27/2016   HDL 31 (L) 04/27/2016   LDLDIRECT 197.0 07/10/2011   LDLCALC 74 04/27/2016   ALT 58 08/11/2017   AST 58 (H) 08/11/2017   NA 135 08/16/2017   K 4.0 08/16/2017   CL 101 08/16/2017   CREATININE 1.20 08/16/2017   BUN 14 08/16/2017   CO2 26 08/16/2017   TSH 5.389 (H) 02/17/2017   INR 1.37 08/15/2017   HGBA1C 8.4 (H) 08/14/2017   MICROALBUR 1.0 11/25/2015    Ivin Poot III, MD 08/16/2017 7:06 AM

## 2017-08-16 NOTE — Progress Notes (Signed)
Patient ID: [name] [name], male   DOB: [birth year], 67 y.o.   MRN: [id number]  TCTS Evening Rounds:   Hemodynamically stable on Milrinone 0.375, epi 2, levo 3, NO PA 32/21, CVP 17 CI = 1.7 Co-ox 63 at 5 pm.  LVAD flow 3.4, PI 1.8  Sedated on vent with sternum open due to RV dysfunction  Urine output good  CT output low  CBC    Component Value Date/Time   WBC 7.3 08/16/2017 1556   RBC 3.89 (L) 08/16/2017 1556   HGB 10.5 (L) 08/16/2017 1556   HCT 33.8 (L) 08/16/2017 1556   PLT 120 (L) 08/16/2017 1556   MCV 86.9 08/16/2017 1556   MCH 27.0 08/16/2017 1556   MCHC 31.1 08/16/2017 1556   RDW 15.6 (H) 08/16/2017 1556   LYMPHSABS 1.3 08/11/2017 1438   MONOABS 0.6 08/11/2017 1438   EOSABS 0.3 08/11/2017 1438   BASOSABS 0.0 08/11/2017 1438     BMET    Component Value Date/Time   NA 137 08/16/2017 1556   K 3.3 (L) 08/16/2017 1556   CL 107 08/16/2017 1556   CO2 24 08/16/2017 1556   GLUCOSE 131 (H) 08/16/2017 1556   BUN 10 08/16/2017 1556   CREATININE 0.84 08/16/2017 1556   CREATININE 1.16 06/02/2016 1207   CALCIUM 8.7 (L) 08/16/2017 1556   GFRNONAA >60 08/16/2017 1556   GFRNONAA 65 06/02/2016 1207   GFRAA >60 08/16/2017 1556   GFRAA 75 06/02/2016 1207     A/P:  S/p LVAD with open sternum due to RV dysfunction.  Continue current plans per PVT.

## 2017-08-16 NOTE — Transfer of Care (Signed)
Immediate Anesthesia Transfer of Care Note  Patient: [name] [name]  Procedure(s) Performed: INSERTION OF IMPLANTABLE LEFT VENTRICULAR ASSIST DEVICE-HM3 (N/A Chest) TRANSESOPHAGEAL ECHOCARDIOGRAM (TEE) (N/A ) TRICUSPID VALVE REPAIR using Edwards MC3 Ring size 30 (N/A Chest)  Patient Location: SICU  Anesthesia Type:General  Level of Consciousness: sedated and Patient remains intubated per anesthesia plan  Airway & Oxygen Therapy: Patient remains intubated per anesthesia plan and Patient placed on Ventilator (see vital sign flow sheet for setting)  Post-op Assessment: Report given to RN and Post -op Vital signs reviewed and stable  Post vital signs: Reviewed and stable  Last Vitals:  Vitals Value Taken Time  BP    Temp 35.4 C 08/16/2017  3:52 PM  Pulse 43 08/16/2017  3:52 PM  Resp 11 08/16/2017  3:52 PM  SpO2 100 % 08/16/2017  3:52 PM  Vitals shown include unvalidated device data.  Last Pain:  Vitals:   08/15/17 1600  TempSrc:   PainSc: 0-No pain         Complications: No apparent anesthesia complications

## 2017-08-16 NOTE — Brief Op Note (Signed)
08/11/2017 - 08/16/2017  12:54 PM  PATIENT:  [name]  67 y.o. male  PRE-OPERATIVE DIAGNOSIS:  ICM  POST-OPERATIVE DIAGNOSIS:  ICM  PROCEDURE:  TRANSESOPHAGEAL ECHOCARDIOGRAM (TEE), MEDIAN STERNOTOMY for  INSERTION OF IMPLANTABLE LEFT VENTRICULAR ASSIST DEVICE-HM3, TRICUSPID VALVE REPAIR (using a an Annuloplasty ring, Model # [id number], Serial # [id number], size 30)  SURGEON:  Surgeon(s) and Role:    Ivin Poot, MD - Primary  PHYSICIAN ASSISTANT: Lars Pinks PA-C  ASSISTANTS: Ara Kussmaul RNFA   ANESTHESIA:   general  EBL: 400 cc  DRAINS: Chest tubes placed in the mediastinal and pleural spaces   COUNTS CORRECT:  YES  DICTATION: .Dragon Dictation  PLAN OF CARE: Admit to inpatient   PATIENT DISPOSITION:  ICU - intubated and hemodynamically stable.   Delay start of Pharmacological VTE agent (>24hrs) due to surgical blood loss or risk of bleeding: yes  BASELINE WEIGHT: 110.1 kg

## 2017-08-16 NOTE — Progress Notes (Signed)
Dr. Prescott Gum made aware of 3rd episode of MAP in 60s, Flows 2.5-2.9, PI in the 6-7s, with multiple PI events. Each time we have given an albumin and he has responded well to the volume. Also made aware of current drip rates (Levo 37mcg, Milrinone 0.69mcg, and Epi 1mcg,) hemoglobin 9.0, cardiac index 1.8-2.02,  HR in the 90s-110s, CVP 18. Orders received for 2 additional albumin if needed, 1 unit PRBC, and to decrease pump speed to 5300. Will continue to closely monitor. Richarda Blade RN

## 2017-08-16 NOTE — Anesthesia Procedure Notes (Signed)
Central Venous Catheter Insertion Performed by: Roberts Gaudy, MD, anesthesiologist Start/End6/17/2019 7:15 AM, 08/16/2017 7:25 AM Patient location: Pre-op. Preanesthetic checklist: patient identified, IV checked, site marked, risks and benefits discussed, surgical consent, monitors and equipment checked, pre-op evaluation, timeout performed and anesthesia consent Hand hygiene performed  and maximum sterile barriers used  PA cath was placed.Swan type:thermodilution Procedure performed without using ultrasound guided technique. Ultrasound Notes:anatomy identified, needle tip was noted to be adjacent to the nerve/plexus identified and no ultrasound evidence of intravascular and/or intraneural injection Attempts: 1 Following insertion, line sutured, dressing applied and Biopatch. Post procedure assessment: blood return through all ports  Patient tolerated the procedure well with no immediate complications.

## 2017-08-16 NOTE — Anesthesia Procedure Notes (Signed)
Arterial Line Insertion Start/End6/17/2019 7:00 AM, 08/16/2017 7:05 AM Performed by: Verdie Drown, CRNA, CRNA  Patient location: Pre-op. Preanesthetic checklist: patient identified, IV checked, site marked, risks and benefits discussed, surgical consent, monitors and equipment checked, pre-op evaluation, timeout performed and anesthesia consent Lidocaine 1% used for infiltration and patient sedated radial was placed Catheter size: 20 G Hand hygiene performed  and maximum sterile barriers used   Attempts: 1 Procedure performed without using ultrasound guided technique. Following insertion, Biopatch and dressing applied. Patient tolerated the procedure well with no immediate complications.

## 2017-08-16 NOTE — OR Nursing (Signed)
13:20 - 45 minute call to SICU charge nurse, Altha Harm

## 2017-08-16 NOTE — OR Nursing (Signed)
14:00 - 20 minute call to SICU charge nurse, Altha Harm

## 2017-08-16 NOTE — Anesthesia Procedure Notes (Signed)
Central Venous Catheter Insertion Performed by: Roberts Gaudy, MD, anesthesiologist Start/End6/17/2019 7:15 AM, 08/16/2017 7:25 AM Patient location: Pre-op. Preanesthetic checklist: patient identified, IV checked, site marked, risks and benefits discussed, surgical consent, monitors and equipment checked, pre-op evaluation, timeout performed and anesthesia consent Lidocaine 1% used for infiltration and patient sedated Hand hygiene performed  and maximum sterile barriers used  Catheter size: 8 Fr Total catheter length 16. Central line was placed.Double lumen Procedure performed using ultrasound guided technique. Ultrasound Notes:image(s) printed for medical record Attempts: 1 Following insertion, dressing applied and line sutured. Post procedure assessment: blood return through all ports  Patient tolerated the procedure well with no immediate complications.

## 2017-08-16 NOTE — Progress Notes (Addendum)
Advanced Heart Failure Rounding Note  PCP-Cardiologist: No primary care provider on file.   Subjective:     Underwent HM-3 implant earlier today. Unable to close chest with RV failure and high intrathoracic pressure  Remains intubated on 70% FiO2  Now on epi 2, NE 3, milrinone 0.375.   Swan#s CVP 17 PA 32/20 (25) Thermo 4.0/1.7  HM-3 VAD  Flow 3.6 Speed 5400 PI 1.7 Power 3.6  Objective:   Weight Range: 242 lb 11.6 oz (110.1 kg) Body mass index is 33.87 kg/m.   Vital Signs:   Temp:  [95.7 F (35.4 C)-100 F (37.8 C)] 98.6 F (37 C) (06/17 1800) Pulse Rate:  [28-106] 43 (06/17 1800) Resp:  [0-23] 0 (06/17 1800) BP: (95-147)/(68-127) 95/83 (06/17 1800) SpO2:  [89 %-100 %] 100 % (06/17 1800) Arterial Line BP: (76-98)/(60-71) 91/63 (06/17 1800) FiO2 (%):  [70 %-100 %] 70 % (06/17 1636) Weight:  [242 lb 11.6 oz (110.1 kg)] 242 lb 11.6 oz (110.1 kg) (06/17 0430) Last BM Date: 08/16/17  Weight change: Filed Weights   08/14/17 0630 08/15/17 0600 08/16/17 0430  Weight: 238 lb 15.7 oz (108.4 kg) 246 lb 14.6 oz (112 kg) 242 lb 11.6 oz (110.1 kg)    Intake/Output:   Intake/Output Summary (Last 24 hours) at 08/16/2017 1855 Last data filed at 08/16/2017 1800 Gross per 24 hour  Intake 4704.33 ml  Output 4405 ml  Net 299.33 ml      Physical Exam    General:  Intubated sedated HEENT: edematous + ETT Neck: supple. RIJ swan Carotids 2+ bilat; no bruits. No lymphadenopathy or thryomegaly appreciated. Cor: LVAD hum. Sternal dressing in place. CTs ok  Lungs: mechanical BS. Coarse Abdomen: obese soft, nontender, ++ distended. Quiet  Driveline site clean. Anchor in place.  Extremities: no cyanosis, clubbing, rash. Warm no 2-3+ edema  Neuro: intubated sedated   Telemetry   AF 70-80s Personally reviewed    Labs    CBC Recent Labs    08/16/17 0358  08/16/17 1440 08/16/17 1556 08/16/17 1733  WBC 6.5  --   --  7.3  --   HGB 11.4*   < > 11.9* 10.5*  --   HCT  35.5*   < > 35.0* 33.8*  --   MCV 85.5  --   --  86.9  --   PLT 211   < >  --  120* 125*   < > = values in this interval not displayed.   Basic Metabolic Panel Recent Labs    08/16/17 0358  08/16/17 1440 08/16/17 1556  NA 135   < > 140 137  K 4.0   < > 3.2* 3.3*  CL 101   < > 101 107  CO2 26  --   --  24  GLUCOSE 267*   < > 162* 131*  BUN 14   < > 10 10  CREATININE 1.20   < > 0.70 0.84  CALCIUM 9.1  --   --  8.7*  MG  --   --   --  1.5*   < > = values in this interval not displayed.   Liver Function Tests No results for input(s): AST, ALT, ALKPHOS, BILITOT, PROT, ALBUMIN in the last 72 hours. No results for input(s): LIPASE, AMYLASE in the last 72 hours. Cardiac Enzymes No results for input(s): CKTOTAL, CKMB, CKMBINDEX, TROPONINI in the last 72 hours.  BNP: BNP (last 3 results) Recent Labs    11/18/16 1355 01/15/17  0911 08/11/17 1438  BNP 584.3* 159.6* 529.3*    ProBNP (last 3 results) Recent Labs    11/12/16 1559  PROBNP 388.0*     D-Dimer Recent Labs    08/16/17 1733  DDIMER PENDING   Hemoglobin A1C Recent Labs    08/14/17 0530  HGBA1C 8.4*   Fasting Lipid Panel No results for input(s): CHOL, HDL, LDLCALC, TRIG, CHOLHDL, LDLDIRECT in the last 72 hours. Thyroid Function Tests No results for input(s): TSH, T4TOTAL, T3FREE, THYROIDAB in the last 72 hours.  Invalid input(s): FREET3  Other results:   Imaging    Dg Chest Port 1 View  Result Date: 08/16/2017 CLINICAL DATA:  Left ventricular assistance device. EXAM: PORTABLE CHEST 1 VIEW COMPARISON:  Radiograph of same day. FINDINGS: Left ventricular assist device is noted. Left-sided chest tube is noted without pneumothorax. Stable cardiomegaly with central pulmonary vascular congestion. Endotracheal and nasogastric tubes are in grossly good position. Right internal jugular catheter is noted with distal tip directed toward right pulmonary artery. Left-sided pacemaker is unchanged in position.  IMPRESSION: Left ventricular assist device is noted. Left-sided chest tube is noted without pneumothorax. Stable cardiomegaly with central pulmonary vascular congestion. Endotracheal and nasogastric tubes in grossly good position. Electronically Signed   By: Marijo Conception, M.D.   On: 08/16/2017 17:04   Dg Chest Portable 1 View  Result Date: 08/16/2017 CLINICAL DATA:  Suspected pneumothorax. EXAM: PORTABLE CHEST 1 VIEW COMPARISON:  Radiograph of August 15, 2017. FINDINGS: Endotracheal tube is seen in grossly good position. Right internal jugular Swan-Ganz catheter is seen directed toward right pulmonary artery. Interval placement of left ventricular assist device. Left-sided pacemaker is unchanged in position. Probable endoscope is seen with distal tip projected over expected position of distal esophagus. Stable cardiomegaly with central pulmonary vascular congestion is noted. Left-sided chest tube is noted without definite evidence of pneumothorax. Mild bibasilar subsegmental atelectasis is noted. Bony thorax is unremarkable. IMPRESSION: Interval placement of left ventricular assist device. Left-sided chest tube is noted without definite evidence of pneumothorax. Endotracheal tube is in grossly good position. Stable cardiomegaly with central pulmonary vascular congestion. Probable endoscopy device seen with distal tip projected over distal esophagus. Electronically Signed   By: Marijo Conception, M.D.   On: 08/16/2017 14:54     Medications:     Scheduled Medications: . [START ON 08/17/2017] acetaminophen  1,000 mg Oral Q6H   Or  . [START ON 08/17/2017] acetaminophen (TYLENOL) oral liquid 160 mg/5 mL  1,000 mg Per Tube Q6H  . [START ON 08/17/2017] aspirin EC  325 mg Oral Daily   Or  . [START ON 08/17/2017] aspirin  324 mg Per Tube Daily   Or  . [START ON 08/17/2017] aspirin  300 mg Rectal Daily  . [START ON 08/17/2017] bisacodyl  10 mg Oral Daily   Or  . [START ON 08/17/2017] bisacodyl  10 mg Rectal  Daily  . chlorhexidine gluconate (MEDLINE KIT)  15 mL Mouth Rinse BID  . Chlorhexidine Gluconate Cloth  6 each Topical Daily  . [START ON 08/17/2017] docusate sodium  200 mg Oral Daily  . insulin regular  0-10 Units Intravenous TID WC  . mouth rinse  15 mL Mouth Rinse 10 times per day  . metoCLOPramide (REGLAN) injection  10 mg Intravenous Q6H  . [START ON 08/18/2017] pantoprazole  40 mg Oral Daily  . [START ON 08/17/2017] rifampin  600 mg Oral Once  . sodium chloride flush  10-40 mL Intracatheter Q12H  . [START ON  08/17/2017] sodium chloride flush  3 mL Intravenous Q12H    Infusions: . sodium chloride 10 mL/hr at 08/16/17 1800  . [START ON 08/17/2017] sodium chloride    . sodium chloride 20 mL/hr at 08/16/17 1800  . albumin human 250 mL (08/16/17 1753)  . dexmedetomidine (PRECEDEX) IV infusion 0.7 mcg/kg/hr (08/16/17 1800)  . EPINEPHrine 4 mg in dextrose 5% 250 mL infusion (16 mcg/mL) 2 mcg/min (08/16/17 1800)  . famotidine (PEPCID) IV    . [START ON 08/17/2017] fluconazole (DIFLUCAN) IV    . insulin (NOVOLIN-R) infusion 3.9 Units/hr (08/16/17 1728)  . lactated ringers    . lactated ringers 20 mL/hr at 08/16/17 1800  . lactated ringers 20 mL/hr at 08/16/17 1800  . [START ON 08/17/2017] levofloxacin (LEVAQUIN) IV    . magnesium sulfate 4 g (08/16/17 1637)  . milrinone 0.375 mcg/kg/min (08/16/17 1800)  . norepinephrine (LEVOPHED) Adult infusion 3 mcg/min (08/16/17 1800)  . potassium chloride 10 mEq (08/16/17 1745)  . potassium chloride    . [START ON 08/17/2017] vancomycin    . vasopressin (PITRESSIN) infusion - *FOR SHOCK*      PRN Medications: sodium chloride, albumin human, lactated ringers, midazolam, morphine injection, morphine injection, ondansetron (ZOFRAN) IV, oxyCODONE, potassium chloride, sodium chloride flush, [START ON 08/17/2017] sodium chloride flush    [name],  [name], [name], [name], [name], [name], HTN, HL, [name]   Directly admitted with persistent low cardiac output for milrinone initiation for home.  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 Acute/Chronic systolic CHF -> cardiogenic shock : - Echo  08/13/2017 EF 20-25%. s/p  Medtronic BiV ICD in place.  - Cath 12/18 with stable 1v CAD.  - s/p HM-3 implant 6/17. Unable to close chest due to high-intrathoracic pressures and RV failure - Hemodynamics marginal but stable. Continue epi, NE, milrinone and NO. Titrate as needed  - Getting albumin now for RV failure. No diuresis yet -VAD interrogated personally. Parameters stable.  2. [name]  - recent cath with stable CAD as above  3. [name] - Recent A1c 8.3 on 6/3.   Cover with SSI in house.   4. CKD II-III -Creatinine baseline 1.2-1.4  - Follow closely post-op  5. [name] - s/p ICD. Quiescent.  - Off sotalol - Keep K>= 4.0 Mg >= 2.0  6. PAF s/p ablation 04/2012 - Device interrogation showed A fib since May 16th.  - Currently in AF with controlled rate  CRITICAL CARE Performed by: Glori Bickers  Total critical care time: 45 minutes  Critical care time was exclusive of separately billable procedures and treating other patients.  Critical care was necessary to treat or prevent imminent or life-threatening deterioration.  Critical care was time spent personally by me (independent of midlevel providers or residents) on the following activities: development of treatment plan with patient and/or surrogate as well as nursing, discussions with consultants, evaluation of patient's response to treatment, examination of patient, obtaining history from patient or surrogate, ordering and performing treatments and interventions, ordering and review of laboratory studies, ordering and review of radiographic studies, pulse oximetry and re-evaluation of patient's  condition.  Length of Stay: [place], MD  08/16/2017, 6:55 PM  Advanced Heart Failure Team Pager [phone] (M-F; 7a - 4p)  Please contact [facility] for night-coverage after hours (4p -7a ) and weekends on [email or web address]

## 2017-08-16 NOTE — Progress Notes (Signed)
  Echocardiogram Echocardiogram Transesophageal has been performed.  Bobbye Charleston 08/16/2017, 9:14 AM

## 2017-08-16 NOTE — Progress Notes (Signed)
Titrating FIO2 down based on ABG results.

## 2017-08-16 NOTE — Progress Notes (Signed)
Pharmacy Antibiotic Note  [name] [name] is a 67 y.o. male admitted on 08/11/2017 with surgical prophylaxis.  Pharmacy has been consulted for vancomycin dosing.  Received vancomycin 1500 mg IV once at 0758 on 6/17. Underwent LVAD placement on 6/17. WBC WNL. Afebrile. Scr prior to surgery 1.2 (normCrCl 60 mL/min) - post surgery 0.7 (likely dilutional).  Plan: Vancomycin 750 mg IV every 12 hours.  Goal trough 15-20 mcg/mL.  Monitor renal fx, clinical pic, and VT prn  Height: 5' 10.98" (180.3 cm) Weight: 242 lb 11.6 oz (110.1 kg) IBW/kg (Calculated) : 75.26  Temp (24hrs), Avg:99.4 F (37.4 C), Min:98.6 F (37 C), Max:100 F (37.8 C)  Recent Labs  Lab 08/12/17 0942  08/13/17 1017 08/14/17 0530 08/15/17 0411  08/16/17 0358  08/16/17 0953 08/16/17 1131 08/16/17 1242 08/16/17 1323 08/16/17 1440  WBC 5.6  --  5.2 4.6 5.0  --  6.5  --   --   --   --   --   --   CREATININE  --    < >  --  1.20 1.40*   < > 1.20   < > 0.70 0.70 0.70 0.70 0.70   < > = values in this interval not displayed.    Estimated Creatinine Clearance: 113 mL/min (by C-G formula based on SCr of 0.7 mg/dL).    Allergies  Allergen Reactions  . Penicillins Hives, Itching and Other (See Comments)    Has patient had a PCN reaction causing immediate rash, facial/tongue/throat swelling, SOB or lightheadedness with hypotension: Yes Has patient had a PCN reaction causing severe rash involving mucus membranes or skin necrosis: No Has patient had a PCN reaction that required hospitalization: No Has patient had a PCN reaction occurring within the last 10 years: No If all of the above answers are "NO", then may proceed with Cephalosporin use.    Microbiology results: 6/17 AICD Lead Vegetation Cx: no orgs seen 6/16 MRSA PCR: negative  Thank you for allowing pharmacy to be a part of this patient's care.  Doylene Canard, PharmD Clinical Pharmacist  Pager: [phone] Phone: [phone] 08/16/2017 3:44 PM

## 2017-08-16 NOTE — Anesthesia Procedure Notes (Signed)
Central Venous Catheter Insertion Performed by: Roberts Gaudy, MD, anesthesiologist Start/End6/17/2019 7:15 AM, 08/16/2017 7:25 AM Patient location: Pre-op. Preanesthetic checklist: patient identified, IV checked, site marked, risks and benefits discussed, surgical consent, monitors and equipment checked, pre-op evaluation, timeout performed and anesthesia consent Lidocaine 1% used for infiltration and patient sedated Hand hygiene performed  and maximum sterile barriers used  Catheter size: 8.5 Fr Sheath introducer Procedure performed using ultrasound guided technique. Ultrasound Notes:anatomy identified, needle tip was noted to be adjacent to the nerve/plexus identified, no ultrasound evidence of intravascular and/or intraneural injection and image(s) printed for medical record Attempts: 1 Following insertion, line sutured and dressing applied. Post procedure assessment: blood return through all ports, free fluid flow and no air  Patient tolerated the procedure well with no immediate complications.

## 2017-08-16 NOTE — Anesthesia Preprocedure Evaluation (Addendum)
Anesthesia Evaluation  Patient identified by MRN, date of birth, ID band Patient awake    Reviewed: Allergy & Precautions, NPO status , Patient's Chart, lab work & pertinent test results  Airway Mallampati: II  TM Distance: >3 FB Neck ROM: Full    Dental  (+) Edentulous Upper, Edentulous Lower   Pulmonary former smoker,    breath sounds clear to auscultation       Cardiovascular hypertension,  Rhythm:Irregular Rate:Normal     Neuro/Psych    GI/Hepatic   Endo/Other  diabetes  Renal/GU      Musculoskeletal   Abdominal   Peds  Hematology   Anesthesia Other Findings   Reproductive/Obstetrics                             Anesthesia Physical Anesthesia Plan  ASA: III  Anesthesia Plan: General   Post-op Pain Management:    Induction: Intravenous  PONV Risk Score and Plan: Ondansetron and Dexamethasone  Airway Management Planned: Oral ETT  Additional Equipment:   Intra-op Plan:   Post-operative Plan: Post-operative intubation/ventilation  Informed Consent: I have reviewed the patients History and Physical, chart, labs and discussed the procedure including the risks, benefits and alternatives for the proposed anesthesia with the patient or authorized representative who has indicated his/her understanding and acceptance.     Plan Discussed with: CRNA and Anesthesiologist  Anesthesia Plan Comments:         Anesthesia Quick Evaluation

## 2017-08-17 ENCOUNTER — Inpatient Hospital Stay (HOSPITAL_COMMUNITY): Payer: PPO

## 2017-08-17 ENCOUNTER — Encounter (HOSPITAL_COMMUNITY): Payer: Self-pay | Admitting: Cardiothoracic Surgery

## 2017-08-17 ENCOUNTER — Inpatient Hospital Stay (HOSPITAL_COMMUNITY): Payer: PPO | Admitting: Certified Registered"

## 2017-08-17 ENCOUNTER — Encounter (HOSPITAL_COMMUNITY)
Admission: AD | Disposition: A | Discharge status: Rehabilitation Facility | Payer: Self-pay | Source: Home / Self Care | Attending: Internal Medicine

## 2017-08-17 DIAGNOSIS — I5043 Acute on chronic combined systolic (congestive) and diastolic (congestive) heart failure: Secondary | ICD-10-CM | POA: Diagnosis not present

## 2017-08-17 DIAGNOSIS — I130 Hypertensive heart and chronic kidney disease with heart failure and stage 1 through stage 4 chronic kidney disease, or unspecified chronic kidney disease: Secondary | ICD-10-CM | POA: Diagnosis not present

## 2017-08-17 DIAGNOSIS — N183 Chronic kidney disease, stage 3 (moderate): Secondary | ICD-10-CM | POA: Diagnosis not present

## 2017-08-17 DIAGNOSIS — I9789 Other postprocedural complications and disorders of the circulatory system, not elsewhere classified: Secondary | ICD-10-CM

## 2017-08-17 DIAGNOSIS — I97630 Postprocedural hematoma of a circulatory system organ or structure following a cardiac catheterization: Secondary | ICD-10-CM | POA: Diagnosis not present

## 2017-08-17 DIAGNOSIS — Z9889 Other specified postprocedural states: Secondary | ICD-10-CM

## 2017-08-17 HISTORY — PX: MEDIASTINAL EXPLORATION: SHX5065

## 2017-08-17 LAB — POCT I-STAT 3, VENOUS BLOOD GAS (G3P V)
ACID-BASE DEFICIT: 7 mmol/L — AB (ref 0.0–2.0)
Bicarbonate: 19 mmol/L — ABNORMAL LOW (ref 20.0–28.0)
O2 SAT: 71 %
PO2 VEN: 37 mmHg (ref 32.0–45.0)
Patient temperature: 35.6
TCO2: 20 mmol/L — AB (ref 22–32)
pCO2, Ven: 35.5 mmHg — ABNORMAL LOW (ref 44.0–60.0)
pH, Ven: 7.329 (ref 7.250–7.430)

## 2017-08-17 LAB — GLUCOSE, CAPILLARY
GLUCOSE-CAPILLARY: 100 mg/dL — AB (ref 65–99)
GLUCOSE-CAPILLARY: 102 mg/dL — AB (ref 65–99)
GLUCOSE-CAPILLARY: 103 mg/dL — AB (ref 65–99)
GLUCOSE-CAPILLARY: 105 mg/dL — AB (ref 65–99)
GLUCOSE-CAPILLARY: 107 mg/dL — AB (ref 65–99)
GLUCOSE-CAPILLARY: 116 mg/dL — AB (ref 65–99)
GLUCOSE-CAPILLARY: 121 mg/dL — AB (ref 65–99)
GLUCOSE-CAPILLARY: 126 mg/dL — AB (ref 65–99)
GLUCOSE-CAPILLARY: 142 mg/dL — AB (ref 65–99)
GLUCOSE-CAPILLARY: 99 mg/dL (ref 65–99)
Glucose-Capillary: 152 mg/dL — ABNORMAL HIGH (ref 65–99)
Glucose-Capillary: 148 mg/dL — ABNORMAL HIGH (ref 65–99)
Glucose-Capillary: 100 mg/dL — ABNORMAL HIGH (ref 65–99)
Glucose-Capillary: 138 mg/dL — ABNORMAL HIGH (ref 65–99)
Glucose-Capillary: 97 mg/dL (ref 65–99)
Glucose-Capillary: 101 mg/dL — ABNORMAL HIGH (ref 65–99)
Glucose-Capillary: 104 mg/dL — ABNORMAL HIGH (ref 65–99)
Glucose-Capillary: 109 mg/dL — ABNORMAL HIGH (ref 65–99)

## 2017-08-17 LAB — COMPREHENSIVE METABOLIC PANEL
ALT: 19 U/L (ref 17–63)
AST: 68 U/L — ABNORMAL HIGH (ref 15–41)
Albumin: 3.3 g/dL — ABNORMAL LOW (ref 3.5–5.0)
Alkaline Phosphatase: 45 U/L (ref 38–126)
Anion gap: 8 (ref 5–15)
BUN: 9 mg/dL (ref 6–20)
CO2: 20 mmol/L — ABNORMAL LOW (ref 22–32)
Calcium: 8.9 mg/dL (ref 8.9–10.3)
Chloride: 106 mmol/L (ref 101–111)
Creatinine, Ser: 0.9 mg/dL (ref 0.61–1.24)
GFR calc Af Amer: >60 mL/min (ref >60)
GFR calc non Af Amer: >60 mL/min (ref >60)
Glucose, Bld: 134 mg/dL — ABNORMAL HIGH (ref 65–99)
Potassium: 4.4 mmol/L (ref 3.5–5.1)
Sodium: 134 mmol/L — ABNORMAL LOW (ref 135–145)
Total Bilirubin: 2.7 mg/dL — ABNORMAL HIGH (ref 0.3–1.2)
Total Protein: 5.9 g/dL — ABNORMAL LOW (ref 6.5–8.1)

## 2017-08-17 LAB — CBC WITH DIFFERENTIAL/PLATELET
Basophils Absolute: 0 10*3/uL (ref 0.0–0.1)
Basophils Relative: 0 %
Eosinophils Absolute: 0 10*3/uL (ref 0.0–0.7)
Eosinophils Relative: 0 %
HCT: 31.2 % — ABNORMAL LOW (ref 39.0–52.0)
Hemoglobin: 10 g/dL — ABNORMAL LOW (ref 13.0–17.0)
Lymphocytes Relative: 9 %
Lymphs Abs: 0.8 10*3/uL (ref 0.7–4.0)
MCH: 27.4 pg (ref 26.0–34.0)
MCHC: 32.1 g/dL (ref 30.0–36.0)
MCV: 85.5 fL (ref 78.0–100.0)
Monocytes Absolute: 0.9 10*3/uL (ref 0.1–1.0)
Monocytes Relative: 10 %
Neutro Abs: 7.3 10*3/uL (ref 1.7–7.7)
Neutrophils Relative %: 81 %
Platelets: 119 10*3/uL — ABNORMAL LOW (ref 150–400)
RBC: 3.65 MIL/uL — ABNORMAL LOW (ref 4.22–5.81)
RDW: 15.5 % (ref 11.5–15.5)
WBC: 9 10*3/uL (ref 4.0–10.5)

## 2017-08-17 LAB — BPAM FFP
BLOOD PRODUCT EXPIRATION DATE: 201906222359
Blood Product Expiration Date: 201906222359
Blood Product Expiration Date: 201906222359
Blood Product Expiration Date: 201906222359
ISSUE DATE / TIME: 201906170956
ISSUE DATE / TIME: 201906170956
ISSUE DATE / TIME: 201906171001
ISSUE DATE / TIME: 201906170956
UNIT TYPE AND RH: 5100
UNIT TYPE AND RH: 5100
Unit Type and Rh: 6200
Unit Type and Rh: 6200

## 2017-08-17 LAB — CBC
HCT: 26.7 % — ABNORMAL LOW (ref 39.0–52.0)
HCT: 26.4 % — ABNORMAL LOW (ref 39.0–52.0)
Hemoglobin: 8.5 g/dL — ABNORMAL LOW (ref 13.0–17.0)
Hemoglobin: 8.4 g/dL — ABNORMAL LOW (ref 13.0–17.0)
MCH: 27.1 pg (ref 26.0–34.0)
MCH: 27.3 pg (ref 26.0–34.0)
MCHC: 31.8 g/dL (ref 30.0–36.0)
MCHC: 31.8 g/dL (ref 30.0–36.0)
MCV: 85 fL (ref 78.0–100.0)
MCV: 85.7 fL (ref 78.0–100.0)
Platelets: 88 10*3/uL — ABNORMAL LOW (ref 150–400)
Platelets: 92 10*3/uL — ABNORMAL LOW (ref 150–400)
RBC: 3.14 MIL/uL — ABNORMAL LOW (ref 4.22–5.81)
RBC: 3.08 MIL/uL — ABNORMAL LOW (ref 4.22–5.81)
RDW: 15.7 % — ABNORMAL HIGH (ref 11.5–15.5)
RDW: 15.7 % — ABNORMAL HIGH (ref 11.5–15.5)
WBC: 7.2 10*3/uL (ref 4.0–10.5)
WBC: 8.6 10*3/uL (ref 4.0–10.5)

## 2017-08-17 LAB — PREPARE PLATELET PHERESIS
UNIT DIVISION: 0
Unit division: 0

## 2017-08-17 LAB — BASIC METABOLIC PANEL
Anion gap: 5 (ref 5–15)
BUN: 9 mg/dL (ref 6–20)
CO2: 22 mmol/L (ref 22–32)
Calcium: 8.8 mg/dL — ABNORMAL LOW (ref 8.9–10.3)
Chloride: 109 mmol/L (ref 101–111)
Creatinine, Ser: 0.91 mg/dL (ref 0.61–1.24)
GFR calc Af Amer: >60 mL/min (ref >60)
GFR calc non Af Amer: >60 mL/min (ref >60)
Glucose, Bld: 115 mg/dL — ABNORMAL HIGH (ref 65–99)
Potassium: 4 mmol/L (ref 3.5–5.1)
Sodium: 136 mmol/L (ref 135–145)

## 2017-08-17 LAB — PREPARE FRESH FROZEN PLASMA: UNIT DIVISION: 0

## 2017-08-17 LAB — POCT I-STAT 3, ART BLOOD GAS (G3+)
ACID-BASE DEFICIT: 4 mmol/L — AB (ref 0.0–2.0)
Acid-base deficit: 3 mmol/L — ABNORMAL HIGH (ref 0.0–2.0)
Acid-base deficit: 4 mmol/L — ABNORMAL HIGH (ref 0.0–2.0)
Acid-base deficit: 5 mmol/L — ABNORMAL HIGH (ref 0.0–2.0)
Acid-base deficit: 5 mmol/L — ABNORMAL HIGH (ref 0.0–2.0)
BICARBONATE: 20.6 mmol/L (ref 20.0–28.0)
BICARBONATE: 21 mmol/L (ref 20.0–28.0)
Bicarbonate: 19.8 mmol/L — ABNORMAL LOW (ref 20.0–28.0)
Bicarbonate: 19.2 mmol/L — ABNORMAL LOW (ref 20.0–28.0)
Bicarbonate: 19.6 mmol/L — ABNORMAL LOW (ref 20.0–28.0)
O2 SAT: 100 %
O2 Saturation: 99 %
O2 Saturation: 100 %
O2 Saturation: 97 %
O2 Saturation: 98 %
PCO2 ART: 29.7 mmHg — AB (ref 32.0–48.0)
PCO2 ART: 36.3 mmHg (ref 32.0–48.0)
PCO2 ART: 32.2 mmHg (ref 32.0–48.0)
PH ART: 7.363 (ref 7.350–7.450)
PH ART: 7.383 (ref 7.350–7.450)
PH ART: 7.385 (ref 7.350–7.450)
PO2 ART: 122 mmHg — AB (ref 83.0–108.0)
PO2 ART: 283 mmHg — AB (ref 83.0–108.0)
Patient temperature: 37.4
Patient temperature: 35.5
TCO2: 21 mmol/L — AB (ref 22–32)
TCO2: 22 mmol/L (ref 22–32)
TCO2: 22 mmol/L (ref 22–32)
TCO2: 20 mmol/L — AB (ref 22–32)
TCO2: 21 mmol/L — AB (ref 22–32)
pCO2 arterial: 36.2 mmHg (ref 32.0–48.0)
pCO2 arterial: 32.7 mmHg (ref 32.0–48.0)
pH, Arterial: 7.434 (ref 7.350–7.450)
pH, Arterial: 7.362 (ref 7.350–7.450)
pO2, Arterial: 258 mmHg — ABNORMAL HIGH (ref 83.0–108.0)
pO2, Arterial: 95 mmHg (ref 83.0–108.0)
pO2, Arterial: 102 mmHg (ref 83.0–108.0)

## 2017-08-17 LAB — HEMOGLOBIN AND HEMATOCRIT, BLOOD
HCT: 28.1 % — ABNORMAL LOW (ref 39.0–52.0)
Hemoglobin: 8.9 g/dL — ABNORMAL LOW (ref 13.0–17.0)

## 2017-08-17 LAB — POCT I-STAT, CHEM 8
BUN: 9 mg/dL (ref 6–20)
BUN: 8 mg/dL (ref 6–20)
BUN: 9 mg/dL (ref 6–20)
CHLORIDE: 102 mmol/L (ref 101–111)
CREATININE: 0.7 mg/dL (ref 0.61–1.24)
Calcium, Ion: 1.29 mmol/L (ref 1.15–1.40)
Calcium, Ion: 1.3 mmol/L (ref 1.15–1.40)
Calcium, Ion: 1.32 mmol/L (ref 1.15–1.40)
Chloride: 106 mmol/L (ref 101–111)
Chloride: 103 mmol/L (ref 101–111)
Creatinine, Ser: 0.7 mg/dL (ref 0.61–1.24)
Creatinine, Ser: 0.7 mg/dL (ref 0.61–1.24)
Glucose, Bld: 167 mg/dL — ABNORMAL HIGH (ref 65–99)
Glucose, Bld: 108 mg/dL — ABNORMAL HIGH (ref 65–99)
Glucose, Bld: 103 mg/dL — ABNORMAL HIGH (ref 65–99)
HCT: 30 % — ABNORMAL LOW (ref 39.0–52.0)
HCT: 23 % — ABNORMAL LOW (ref 39.0–52.0)
HEMATOCRIT: 26 % — AB (ref 39.0–52.0)
HEMOGLOBIN: 7.8 g/dL — AB (ref 13.0–17.0)
HEMOGLOBIN: 8.8 g/dL — AB (ref 13.0–17.0)
Hemoglobin: 10.2 g/dL — ABNORMAL LOW (ref 13.0–17.0)
POTASSIUM: 3.7 mmol/L (ref 3.5–5.1)
POTASSIUM: 4.3 mmol/L (ref 3.5–5.1)
Potassium: 4.2 mmol/L (ref 3.5–5.1)
SODIUM: 137 mmol/L (ref 135–145)
Sodium: 136 mmol/L (ref 135–145)
Sodium: 140 mmol/L (ref 135–145)
TCO2: 21 mmol/L — AB (ref 22–32)
TCO2: 21 mmol/L — AB (ref 22–32)
TCO2: 19 mmol/L — AB (ref 22–32)

## 2017-08-17 LAB — COOXEMETRY PANEL
Carboxyhemoglobin: 2 % — ABNORMAL HIGH (ref 0.5–1.5)
Carboxyhemoglobin: 1.5 % (ref 0.5–1.5)
Carboxyhemoglobin: 1.4 % (ref 0.5–1.5)
Carboxyhemoglobin: 1.6 % — ABNORMAL HIGH (ref 0.5–1.5)
Methemoglobin: 1.1 % (ref 0.0–1.5)
Methemoglobin: 1.7 % — ABNORMAL HIGH (ref 0.0–1.5)
Methemoglobin: 1.8 % — ABNORMAL HIGH (ref 0.0–1.5)
Methemoglobin: 1.6 % — ABNORMAL HIGH (ref 0.0–1.5)
O2 Saturation: 63.6 %
O2 Saturation: 57.7 %
O2 Saturation: 65 %
O2 Saturation: 63.1 %
Total hemoglobin: 9.3 g/dL — ABNORMAL LOW (ref 12.0–16.0)
Total hemoglobin: 10.2 g/dL — ABNORMAL LOW (ref 12.0–16.0)
Total hemoglobin: 9.7 g/dL — ABNORMAL LOW (ref 12.0–16.0)
Total hemoglobin: 11.4 g/dL — ABNORMAL LOW (ref 12.0–16.0)

## 2017-08-17 LAB — LACTATE DEHYDROGENASE: LDH: 274 U/L — ABNORMAL HIGH (ref 98–192)

## 2017-08-17 LAB — CREATININE, SERUM
Creatinine, Ser: 0.89 mg/dL (ref 0.61–1.24)
GFR calc Af Amer: >60 mL/min (ref >60)
GFR calc non Af Amer: >60 mL/min (ref >60)

## 2017-08-17 LAB — BPAM PLATELET PHERESIS
Blood Product Expiration Date: 201906172359
Blood Product Expiration Date: 201906190750
ISSUE DATE / TIME: 201906171258
ISSUE DATE / TIME: 201906180755
UNIT TYPE AND RH: 2800
Unit Type and Rh: 6200

## 2017-08-17 LAB — MAGNESIUM
Magnesium: 1.9 mg/dL (ref 1.7–2.4)
Magnesium: 1.7 mg/dL (ref 1.7–2.4)

## 2017-08-17 LAB — BRAIN NATRIURETIC PEPTIDE: B Natriuretic Peptide: 266.4 pg/mL — ABNORMAL HIGH (ref 0.0–100.0)

## 2017-08-17 LAB — ECHO INTRAOPERATIVE TEE
Height: 70.984 in
Weight: 3883.62 oz

## 2017-08-17 LAB — PHOSPHORUS: Phosphorus: 3.5 mg/dL (ref 2.5–4.6)

## 2017-08-17 LAB — CALCIUM, IONIZED: Calcium, Ionized, Serum: 4.9 mg/dL (ref 4.5–5.6)

## 2017-08-17 LAB — PROTIME-INR
INR: 1.71
INR: 1.95
Prothrombin Time: 19.9 seconds — ABNORMAL HIGH (ref 11.4–15.2)
Prothrombin Time: 22.1 seconds — ABNORMAL HIGH (ref 11.4–15.2)

## 2017-08-17 LAB — APTT
aPTT: 47 seconds — ABNORMAL HIGH (ref 24–36)
aPTT: 45 seconds — ABNORMAL HIGH (ref 24–36)

## 2017-08-17 LAB — PREPARE RBC (CROSSMATCH)

## 2017-08-17 SURGERY — EXPLORATION, MEDIASTINUM
Site: Chest

## 2017-08-17 MED ORDER — SODIUM BICARBONATE 8.4 % IV SOLN
50.0000 meq | Freq: Once | INTRAVENOUS | Status: AC
  Administered 2017-08-17: 50 meq via INTRAVENOUS

## 2017-08-17 MED ORDER — MIDAZOLAM HCL 2 MG/2ML IJ SOLN
INTRAMUSCULAR | Status: AC
  Filled 2017-08-17: qty 2

## 2017-08-17 MED ORDER — FAMOTIDINE IN NACL 20-0.9 MG/50ML-% IV SOLN
20.0000 mg | Freq: Two times a day (BID) | INTRAVENOUS | Status: DC
  Administered 2017-08-17 – 2017-09-01 (×32): 20 mg via INTRAVENOUS
  Filled 2017-08-17 (×33): qty 50

## 2017-08-17 MED ORDER — HEMOSTATIC AGENTS (NO CHARGE) OPTIME
TOPICAL | Status: DC | PRN
  Administered 2017-08-17: 1 via TOPICAL

## 2017-08-17 MED ORDER — ONDANSETRON HCL 4 MG/2ML IJ SOLN: 4.0000 mg | Freq: Four times a day (QID) | INTRAMUSCULAR | Status: DC | PRN

## 2017-08-17 MED ORDER — FENTANYL CITRATE (PF) 250 MCG/5ML IJ SOLN
INTRAMUSCULAR | Status: AC
  Filled 2017-08-17: qty 5

## 2017-08-17 MED ORDER — LACTATED RINGERS IV SOLN
INTRAVENOUS | Status: DC | PRN
  Administered 2017-08-17: 07:00:00 via INTRAVENOUS

## 2017-08-17 MED ORDER — MILRINONE LACTATE IN DEXTROSE 20-5 MG/100ML-% IV SOLN
0.5000 ug/kg/min | INTRAVENOUS | Status: DC
  Administered 2017-08-17 – 2017-08-29 (×43): 0.5 ug/kg/min via INTRAVENOUS
  Filled 2017-08-17 (×30): qty 100
  Filled 2017-08-17: qty 200
  Filled 2017-08-17 (×13): qty 100

## 2017-08-17 MED ORDER — 0.9 % SODIUM CHLORIDE (POUR BTL) OPTIME
TOPICAL | Status: DC | PRN
  Administered 2017-08-17: 3000 mL

## 2017-08-17 MED ORDER — ROCURONIUM BROMIDE 100 MG/10ML IV SOLN
INTRAVENOUS | Status: DC | PRN
  Administered 2017-08-17: 100 mg via INTRAVENOUS

## 2017-08-17 MED ORDER — LEVALBUTEROL HCL 0.63 MG/3ML IN NEBU
0.6300 mg | INHALATION_SOLUTION | Freq: Four times a day (QID) | RESPIRATORY_TRACT | Status: DC
  Administered 2017-08-17 – 2017-08-20 (×14): 0.63 mg via RESPIRATORY_TRACT
  Filled 2017-08-17 (×14): qty 3

## 2017-08-17 MED ORDER — FENTANYL CITRATE (PF) 2500 MCG/50ML IJ SOLN
0.0000 ug/h | INTRAMUSCULAR | Status: DC
  Administered 2017-08-17: 25 ug/h via INTRAVENOUS
  Administered 2017-08-18: 200 ug/h via INTRAVENOUS
  Administered 2017-08-19: 250 ug/h via INTRAVENOUS
  Administered 2017-08-19 – 2017-08-20 (×2): 275 ug/h via INTRAVENOUS
  Filled 2017-08-17 (×6): qty 100

## 2017-08-17 MED ORDER — MIDAZOLAM HCL 2 MG/2ML IJ SOLN
INTRAMUSCULAR | Status: DC | PRN
  Administered 2017-08-17 (×2): 2 mg via INTRAVENOUS

## 2017-08-17 MED ORDER — ALBUMIN HUMAN 5 % IV SOLN
12.5000 g | Freq: Once | INTRAVENOUS | Status: AC
  Administered 2017-08-17: 12.5 g via INTRAVENOUS

## 2017-08-17 MED ORDER — VANCOMYCIN HCL IN DEXTROSE 750-5 MG/150ML-% IV SOLN
750.0000 mg | Freq: Two times a day (BID) | INTRAVENOUS | Status: DC
  Administered 2017-08-17: 750 mg via INTRAVENOUS
  Filled 2017-08-17 (×2): qty 150

## 2017-08-17 MED ORDER — SODIUM CHLORIDE 0.9 % IV SOLN
INTRAVENOUS | Status: AC
  Administered 2017-08-17: 1000 mL
  Filled 2017-08-17: qty 1000

## 2017-08-17 MED ORDER — FENTANYL BOLUS VIA INFUSION
50.0000 ug | INTRAVENOUS | Status: DC | PRN
  Administered 2017-08-17 – 2017-08-23 (×15): 50 ug via INTRAVENOUS
  Filled 2017-08-17: qty 50

## 2017-08-17 MED ORDER — LACTATED RINGERS IV SOLN: INTRAVENOUS | Status: DC | PRN

## 2017-08-17 MED ORDER — FENTANYL CITRATE (PF) 250 MCG/5ML IJ SOLN
INTRAMUSCULAR | Status: DC | PRN
Start: 2017-08-17 — End: 2017-08-17
  Administered 2017-08-17: 100 ug via INTRAVENOUS
  Administered 2017-08-17 (×2): 50 ug via INTRAVENOUS
  Administered 2017-08-17: 100 ug via INTRAVENOUS
  Administered 2017-08-17: 150 ug via INTRAVENOUS
  Administered 2017-08-17: 50 ug via INTRAVENOUS

## 2017-08-17 MED ORDER — HEPARIN (PORCINE) IN NACL 100-0.45 UNIT/ML-% IJ SOLN
800.0000 [IU]/h | INTRAMUSCULAR | Status: DC
  Administered 2017-08-18: 800 [IU]/h via INTRAVENOUS
  Filled 2017-08-17: qty 250

## 2017-08-17 MED ORDER — VANCOMYCIN HCL IN DEXTROSE 750-5 MG/150ML-% IV SOLN
750.0000 mg | Freq: Once | INTRAVENOUS | Status: AC
  Administered 2017-08-17: 750 mg via INTRAVENOUS
  Filled 2017-08-17: qty 150

## 2017-08-17 MED FILL — Sodium Chloride IV Soln 0.9%: INTRAVENOUS | Qty: 250 | Status: AC

## 2017-08-17 MED FILL — Calcium Chloride Inj 10%: INTRAVENOUS | Qty: 10 | Status: CN

## 2017-08-17 MED FILL — Calcium Chloride Inj 10%: INTRAVENOUS | Qty: 10 | Status: AC

## 2017-08-17 MED FILL — Sodium Chloride IV Soln 0.9%: INTRAVENOUS | Qty: 2000 | Status: AC

## 2017-08-17 MED FILL — Heparin Sodium (Porcine) Inj 1000 Unit/ML: INTRAMUSCULAR | Qty: 30 | Status: AC

## 2017-08-17 MED FILL — Electrolyte-R (PH 7.4) Solution: INTRAVENOUS | Qty: 5000 | Status: AC

## 2017-08-17 MED FILL — Mannitol IV Soln 20%: INTRAVENOUS | Qty: 500 | Status: AC

## 2017-08-17 MED FILL — Phenylephrine HCl IV Soln 10 MG/ML: INTRAVENOUS | Qty: 2 | Status: AC

## 2017-08-17 MED FILL — Magnesium Sulfate Inj 50%: INTRAMUSCULAR | Qty: 10 | Status: AC

## 2017-08-17 MED FILL — Potassium Chloride Inj 2 mEq/ML: INTRAVENOUS | Qty: 40 | Status: AC

## 2017-08-17 MED FILL — Sodium Bicarbonate IV Soln 8.4%: INTRAVENOUS | Qty: 50 | Status: CN

## 2017-08-17 MED FILL — Electrolyte-R (PH 7.4) Solution: INTRAVENOUS | Qty: 5000 | Status: CN

## 2017-08-17 MED FILL — Sodium Bicarbonate IV Soln 8.4%: INTRAVENOUS | Qty: 50 | Status: AC

## 2017-08-17 MED FILL — Mannitol IV Soln 20%: INTRAVENOUS | Qty: 500 | Status: CN

## 2017-08-17 MED FILL — Sodium Chloride IV Soln 0.9%: INTRAVENOUS | Qty: 2000 | Status: CN

## 2017-08-17 MED FILL — Heparin Sodium (Porcine) Inj 1000 Unit/ML: INTRAMUSCULAR | Qty: 30 | Status: CN

## 2017-08-17 SURGICAL SUPPLY — 115 items
ADAPTER CARDIO PERF ANTE/RETRO (ADAPTER) IMPLANT
ADPR PRFSN 84XANTGRD RTRGD (ADAPTER)
ANTEGRADE CPLG (MISCELLANEOUS) ×3 IMPLANT
BAG DECANTER FOR FLEXI CONT (MISCELLANEOUS) ×4 IMPLANT
BLADE STERNUM SYSTEM 6 (BLADE) ×1 IMPLANT
BLADE SURG 12 STRL SS (BLADE) ×7 IMPLANT
BLADE SURG 15 STRL LF DISP TIS (BLADE) IMPLANT
BLADE SURG 15 STRL SS (BLADE)
CANISTER SUCT 3000ML PPV (MISCELLANEOUS) ×4 IMPLANT
CANNULA ARTERIAL NVNT 3/8 20FR (MISCELLANEOUS) ×1 IMPLANT
CANNULA EZ GLIDE AORTIC 21FR (CANNULA) ×3 IMPLANT
CANNULA VENOUS LOW PROF 34X46 (CANNULA) ×1 IMPLANT
CATH FOLEY 2WAY SLVR  5CC 14FR (CATHETERS)
CATH FOLEY 2WAY SLVR 5CC 14FR (CATHETERS) ×1 IMPLANT
CATH HEART VENT LEFT (CATHETERS) IMPLANT
CATH HYDRAGLIDE XL THORACIC (CATHETERS) ×4 IMPLANT
CATH ROBINSON RED A/P 18FR (CATHETERS) ×2 IMPLANT
CATH THORACIC 28FR (CATHETERS) ×3 IMPLANT
CATH THORACIC 28FR RT ANG (CATHETERS) ×3 IMPLANT
CATH THORACIC 36FR RT ANG (CATHETERS) ×3 IMPLANT
CHLORAPREP W/TINT 26ML (MISCELLANEOUS) ×1 IMPLANT
CONN ST 1/4X3/8  BEN (MISCELLANEOUS) ×2
CONN ST 1/4X3/8 BEN (MISCELLANEOUS) ×1 IMPLANT
CRADLE DONUT ADULT HEAD (MISCELLANEOUS) ×4 IMPLANT
DRAIN CHANNEL 28F RND 3/8 FF (WOUND CARE) IMPLANT
DRAIN CHANNEL 32F RND 10.7 FF (WOUND CARE) IMPLANT
DRAPE BILATERAL SPLIT (DRAPES) ×4 IMPLANT
DRAPE C-ARM 35X43 STRL (DRAPES) ×3 IMPLANT
DRAPE CV SPLIT W-CLR ANES SCRN (DRAPES) ×4 IMPLANT
DRAPE INCISE IOBAN 66X45 STRL (DRAPES) ×8 IMPLANT
DRAPE SLUSH/WARMER DISC (DRAPES) ×4 IMPLANT
DRSG AQUACEL AG ADV 3.5X14 (GAUZE/BANDAGES/DRESSINGS) ×4 IMPLANT
ELECT BLADE 4.0 EZ CLEAN MEGAD (MISCELLANEOUS) ×4
ELECT BLADE 6.5 EXT (BLADE) ×1 IMPLANT
ELECT CAUTERY BLADE 6.4 (BLADE) ×4 IMPLANT
ELECT REM PT RETURN 9FT ADLT (ELECTROSURGICAL) ×8
ELECTRODE BLDE 4.0 EZ CLN MEGD (MISCELLANEOUS) ×2 IMPLANT
ELECTRODE REM PT RTRN 9FT ADLT (ELECTROSURGICAL) ×3 IMPLANT
FELT TEFLON 1X6 (MISCELLANEOUS) ×3 IMPLANT
FELT TEFLON 6X6 (MISCELLANEOUS) ×1 IMPLANT
GAUZE SPONGE 4X4 12PLY STRL (GAUZE/BANDAGES/DRESSINGS) ×5 IMPLANT
GLOVE BIO SURGEON STRL SZ 6 (GLOVE) ×12 IMPLANT
GLOVE BIO SURGEON STRL SZ 6.5 (GLOVE) ×8 IMPLANT
GLOVE BIO SURGEON STRL SZ7.5 (GLOVE) ×15 IMPLANT
GLOVE BIO SURGEONS STRL SZ 6.5 (GLOVE) ×4
GLOVE BIOGEL PI IND STRL 6 (GLOVE) ×3 IMPLANT
GLOVE BIOGEL PI IND STRL 6.5 (GLOVE) ×6 IMPLANT
GLOVE BIOGEL PI IND STRL 8 (GLOVE) ×2 IMPLANT
GLOVE BIOGEL PI INDICATOR 6 (GLOVE) ×6
GLOVE BIOGEL PI INDICATOR 6.5 (GLOVE) ×12
GLOVE BIOGEL PI INDICATOR 8 (GLOVE) ×4
GLOVE ECLIPSE 8.0 STRL XLNG CF (GLOVE) ×6 IMPLANT
GOWN STRL REUS W/ TWL LRG LVL3 (GOWN DISPOSABLE) ×8 IMPLANT
GOWN STRL REUS W/ TWL XL LVL3 (GOWN DISPOSABLE) ×2 IMPLANT
GOWN STRL REUS W/TWL 2XL LVL3 (GOWN DISPOSABLE) ×3 IMPLANT
GOWN STRL REUS W/TWL LRG LVL3 (GOWN DISPOSABLE) ×16
GOWN STRL REUS W/TWL XL LVL3 (GOWN DISPOSABLE)
HEMOSTAT POWDER SURGIFOAM 1G (HEMOSTASIS) ×4 IMPLANT
HEMOSTAT SURGICEL 2X14 (HEMOSTASIS) IMPLANT
INSERT FOGARTY XLG (MISCELLANEOUS) IMPLANT
KIT BASIN OR (CUSTOM PROCEDURE TRAY) ×4 IMPLANT
KIT DILATOR VASC 18G NDL (KITS) ×3 IMPLANT
KIT SUCTION CATH 14FR (SUCTIONS) ×7 IMPLANT
KIT TURNOVER KIT B (KITS) ×4 IMPLANT
LEAD PACING MYOCARDI (MISCELLANEOUS) IMPLANT
LINE VENT (MISCELLANEOUS) ×3 IMPLANT
NS IRRIG 1000ML POUR BTL (IV SOLUTION) ×17 IMPLANT
PACK OPEN HEART (CUSTOM PROCEDURE TRAY) ×4 IMPLANT
PACK VENTRIC ASSIST CUSTOM (KITS) ×3 IMPLANT
PAD ARMBOARD 7.5X6 YLW CONV (MISCELLANEOUS) ×8 IMPLANT
PAD DEFIB R2 (MISCELLANEOUS) ×4 IMPLANT
PUMP BLOOD CENTRIMAG (PUMP) ×3 IMPLANT
PUNCH AORTIC ROTATE 4.5MM 8IN (MISCELLANEOUS) ×1 IMPLANT
SEALANT SURG COSEAL 8ML (VASCULAR PRODUCTS) ×1 IMPLANT
SET CARDIOPLEGIA MPS 5001102 (MISCELLANEOUS) ×3 IMPLANT
SHEATH AVANTI 11CM 5FR (SHEATH) IMPLANT
SUCKER INTRACARDIAC WEIGHTED (SUCKER) ×1 IMPLANT
SUT BONE WAX W31G (SUTURE) ×3 IMPLANT
SUT ETHIBOND 2 0 SH (SUTURE) ×24
SUT ETHIBOND 2 0 SH 36X2 (SUTURE) ×11 IMPLANT
SUT ETHIBOND NAB MH 2-0 36IN (SUTURE) ×12 IMPLANT
SUT PROLENE 3 0 RB 1 (SUTURE) IMPLANT
SUT PROLENE 3 0 SH DA (SUTURE) ×2 IMPLANT
SUT PROLENE 4 0 RB 1 (SUTURE) ×4
SUT PROLENE 4 0 SH DA (SUTURE) ×6 IMPLANT
SUT PROLENE 4-0 RB1 .5 CRCL 36 (SUTURE) ×5 IMPLANT
SUT PROLENE 5 0 C 1 36 (SUTURE) ×3 IMPLANT
SUT PROLENE 5 0 C1 (SUTURE) ×3 IMPLANT
SUT SILK  1 MH (SUTURE) ×10
SUT SILK 1 MH (SUTURE) ×9 IMPLANT
SUT SILK 1 TIES 10X30 (SUTURE) ×4 IMPLANT
SUT SILK 2 0 SH CR/8 (SUTURE) ×11 IMPLANT
SUT STEEL 6MS V (SUTURE) ×2 IMPLANT
SUT STEEL SZ 6 DBL 3X14 BALL (SUTURE) ×1 IMPLANT
SUT TEM PAC WIRE 2 0 SH (SUTURE) IMPLANT
SUT VIC AB 1 CTX 18 (SUTURE) ×6 IMPLANT
SUT VIC AB 1 CTX 36 (SUTURE)
SUT VIC AB 1 CTX36XBRD ANBCTR (SUTURE) ×2 IMPLANT
SUT VIC AB 2-0 CTX 27 (SUTURE) ×8 IMPLANT
SUT VIC AB 3-0 SH 8-18 (SUTURE) ×1 IMPLANT
SUT VIC AB 3-0 X1 27 (SUTURE) ×8 IMPLANT
SYR 50ML LL SCALE MARK (SYRINGE) ×1 IMPLANT
SYSTEM SAHARA CHEST DRAIN ATS (WOUND CARE) ×4 IMPLANT
TAPE CLOTH SURG 4X10 WHT LF (GAUZE/BANDAGES/DRESSINGS) ×3 IMPLANT
TAPE PAPER 2X10 WHT MICROPORE (GAUZE/BANDAGES/DRESSINGS) ×3 IMPLANT
TOWEL GREEN STERILE (TOWEL DISPOSABLE) ×4 IMPLANT
TRAY CATH LUMEN 1 20CM STRL (SET/KITS/TRAYS/PACK) ×1 IMPLANT
TRAY FOLEY SLVR 16FR TEMP STAT (SET/KITS/TRAYS/PACK) ×1 IMPLANT
TUBE CONNECTING 12'X1/4 (SUCTIONS)
TUBE CONNECTING 12X1/4 (SUCTIONS) ×1 IMPLANT
TUBE SUCT INTRACARD DLP 20F (MISCELLANEOUS) ×3 IMPLANT
UNDERPAD 30X30 (UNDERPADS AND DIAPERS) ×4 IMPLANT
VENT LEFT HEART 12002 (CATHETERS)
WATER STERILE IRR 1000ML POUR (IV SOLUTION) ×8 IMPLANT
YANKAUER SUCT BULB TIP NO VENT (SUCTIONS) ×1 IMPLANT

## 2017-08-17 NOTE — Transfer of Care (Signed)
Immediate Anesthesia Transfer of Care Note  Patient: [name] [name]  Procedure(s) Performed: MEDIASTINAL REXPLORATION with evacuation of hematoma (Chest)  Patient Location: SICU  Anesthesia Type:General  Level of Consciousness: Patient remains intubated per anesthesia plan  Airway & Oxygen Therapy: Patient remains intubated per anesthesia plan and Patient placed on Ventilator (see vital sign flow sheet for setting)  Post-op Assessment: Report given to RN and Post -op Vital signs reviewed and stable  Post vital signs: Reviewed and stable  Last Vitals:  Vitals Value Taken Time  BP    Temp    Pulse    Resp    SpO2     Hr 76, RR 14, sats 100% bp 101/69(88) Last Pain:  Vitals:   08/17/17 0544  TempSrc:   PainSc: Asleep         Complications: No apparent anesthesia complications

## 2017-08-17 NOTE — Progress Notes (Signed)
Advanced Heart Failure Rounding Note  PCP-Cardiologist: No primary care provider on file.   Subjective:     Underwent HM-3 implant on 6/17 Unable to close chest with RV failure and high intrathoracic pressure  Very marginal output over night despite optimization of inotropes and volume. CVP now in 18-20 range with CI 1.7. Flow on VAD is down. Multiple PI events  Now on epi 4.5, NE 3, milrinone 0.4 and NO at 30  Plan to take to OR for RVAD  Swan#s CVP 17-18 PA 27/17 Thermo 3.9/1.7  HM-3 VAD  Flow 3.4 Speed 5400 PI 1.8 Power 3.5  Objective:   Weight Range: 242 lb 11.6 oz (110.1 kg) Body mass index is 33.87 kg/m.   Vital Signs:   Temp:  [95.7 F (35.4 C)-99.3 F (37.4 C)] 99.1 F (37.3 C) (06/18 0545) Pulse Rate:  [28-150] 89 (06/18 0015) Resp:  [0-21] 18 (06/18 0545) BP: (80-115)/(60-87) 97/80 (06/18 0530) SpO2:  [99 %-100 %] 100 % (06/18 0500) Arterial Line BP: (66-103)/(54-71) 92/61 (06/18 0545) FiO2 (%):  [50 %-100 %] 50 % (06/18 0500) Last BM Date: 08/16/17  Weight change: Filed Weights   08/14/17 0630 08/15/17 0600 08/16/17 0430  Weight: 238 lb 15.7 oz (108.4 kg) 246 lb 14.6 oz (112 kg) 242 lb 11.6 oz (110.1 kg)    Intake/Output:   Intake/Output Summary (Last 24 hours) at 08/17/2017 0616 Last data filed at 08/17/2017 0600 Gross per 24 hour  Intake 7142.22 ml  Output 3764 ml  Net 3378.22 ml      Physical Exam    General:  Intubated/sedated HEENT: + ETT Neck: supple. RIJ swan Carotids 2+ bilat; no bruits. No lymphadenopathy or thryomegaly appreciated. Cor: LVAD hum.  Sternal dressing ok multiple CTs Lungs: Coarse Abdomen: obese soft, nontender, +distended. quiet. Driveline site clean. Anchor in place.  Extremities: no cyanosis, clubbing, rash. Warm 2+ edema  Neuro:intubated sedated   Telemetry   AF 100-110 Personally reviewed    Labs    CBC Recent Labs    08/16/17 2100 08/16/17 2109 08/17/17 0314  WBC 7.2  --  9.0  NEUTROABS  --    --  7.3  HGB 9.1* 9.5* 10.0*  HCT 28.7* 28.0* 31.2*  MCV 86.4  --  85.5  PLT 111*  --  174*   Basic Metabolic Panel Recent Labs    08/16/17 1556 08/16/17 2100 08/16/17 2109 08/17/17 0314  NA 137  --  139 134*  K 3.3*  --  4.2 4.4  CL 107  --  103 106  CO2 24  --   --  20*  GLUCOSE 131*  --  95 134*  BUN 10  --  9 9  CREATININE 0.84 0.88 0.70 0.90  CALCIUM 8.7*  --   --  8.9  MG 1.5* 2.3  --  1.9  PHOS  --   --   --  3.5   Liver Function Tests Recent Labs    08/17/17 0314  AST 68*  ALT 19  ALKPHOS 45  BILITOT 2.7*  PROT 5.9*  ALBUMIN 3.3*   No results for input(s): LIPASE, AMYLASE in the last 72 hours. Cardiac Enzymes No results for input(s): CKTOTAL, CKMB, CKMBINDEX, TROPONINI in the last 72 hours.  BNP: BNP (last 3 results) Recent Labs    01/15/17 0911 08/11/17 1438 08/17/17 0314  BNP 159.6* 529.3* 266.4*    ProBNP (last 3 results) Recent Labs    11/12/16 1559  PROBNP 388.0*  D-Dimer Recent Labs    08/16/17 1733  DDIMER 2.61*   Hemoglobin A1C No results for input(s): HGBA1C in the last 72 hours. Fasting Lipid Panel No results for input(s): CHOL, HDL, LDLCALC, TRIG, CHOLHDL, LDLDIRECT in the last 72 hours. Thyroid Function Tests No results for input(s): TSH, T4TOTAL, T3FREE, THYROIDAB in the last 72 hours.  Invalid input(s): FREET3  Other results:   Imaging    Dg Chest Port 1 View  Result Date: 08/17/2017 CLINICAL DATA:  Left ventricular assist device patient with increased heart rate tonight. EXAM: PORTABLE CHEST 1 VIEW COMPARISON:  08/16/2017 FINDINGS: Cardiac pacemaker. Left ventricular assist device. Endotracheal tube with tip measuring 3.8 cm above the carina. Right Swan-Ganz catheter with tip over the pulmonary outflow tract. Left chest tube. No visible pneumothorax. Shallow inspiration. Cardiac enlargement with mild central vascular congestion. Vascular congestion is improved since previous study. Slight interstitial pattern  to the lung bases suggesting mild interstitial edema, also improved. No blunting of costophrenic angles. IMPRESSION: 1. Appliances appear in satisfactory position. 2. Cardiac enlargement with improved vascular congestion and mild residual interstitial edema. Electronically Signed   By: Lucienne Capers M.D.   On: 08/17/2017 04:46   Dg Chest Port 1 View  Result Date: 08/16/2017 CLINICAL DATA:  Left ventricular assistance device. EXAM: PORTABLE CHEST 1 VIEW COMPARISON:  Radiograph of same day. FINDINGS: Left ventricular assist device is noted. Left-sided chest tube is noted without pneumothorax. Stable cardiomegaly with central pulmonary vascular congestion. Endotracheal and nasogastric tubes are in grossly good position. Right internal jugular catheter is noted with distal tip directed toward right pulmonary artery. Left-sided pacemaker is unchanged in position. IMPRESSION: Left ventricular assist device is noted. Left-sided chest tube is noted without pneumothorax. Stable cardiomegaly with central pulmonary vascular congestion. Endotracheal and nasogastric tubes in grossly good position. Electronically Signed   By: Marijo Conception, M.D.   On: 08/16/2017 17:04   Dg Chest Portable 1 View  Result Date: 08/16/2017 CLINICAL DATA:  Suspected pneumothorax. EXAM: PORTABLE CHEST 1 VIEW COMPARISON:  Radiograph of August 15, 2017. FINDINGS: Endotracheal tube is seen in grossly good position. Right internal jugular Swan-Ganz catheter is seen directed toward right pulmonary artery. Interval placement of left ventricular assist device. Left-sided pacemaker is unchanged in position. Probable endoscope is seen with distal tip projected over expected position of distal esophagus. Stable cardiomegaly with central pulmonary vascular congestion is noted. Left-sided chest tube is noted without definite evidence of pneumothorax. Mild bibasilar subsegmental atelectasis is noted. Bony thorax is unremarkable. IMPRESSION: Interval  placement of left ventricular assist device. Left-sided chest tube is noted without definite evidence of pneumothorax. Endotracheal tube is in grossly good position. Stable cardiomegaly with central pulmonary vascular congestion. Probable endoscopy device seen with distal tip projected over distal esophagus. Electronically Signed   By: Marijo Conception, M.D.   On: 08/16/2017 14:54     Medications:     Scheduled Medications: . acetaminophen  1,000 mg Oral Q6H   Or  . acetaminophen (TYLENOL) oral liquid 160 mg/5 mL  1,000 mg Per Tube Q6H  . aspirin EC  325 mg Oral Daily   Or  . aspirin  324 mg Per Tube Daily   Or  . aspirin  300 mg Rectal Daily  . bisacodyl  10 mg Oral Daily   Or  . bisacodyl  10 mg Rectal Daily  . chlorhexidine gluconate (MEDLINE KIT)  15 mL Mouth Rinse BID  . Chlorhexidine Gluconate Cloth  6 each Topical Daily  .  docusate sodium  200 mg Oral Daily  . insulin regular  0-10 Units Intravenous TID WC  . mouth rinse  15 mL Mouth Rinse 10 times per day  . metoCLOPramide (REGLAN) injection  10 mg Intravenous Q6H  . [START ON 08/18/2017] pantoprazole  40 mg Oral Daily  . rifampin  600 mg Oral Once  . sodium chloride flush  10-40 mL Intracatheter Q12H  . sodium chloride flush  3 mL Intravenous Q12H    Infusions: . sodium chloride 10 mL/hr at 08/17/17 0100  . sodium chloride    . sodium chloride 20 mL/hr at 08/17/17 0100  . dexmedetomidine (PRECEDEX) IV infusion 1.2 mcg/kg/hr (08/17/17 0600)  . EPINEPHrine 4 mg in dextrose 5% 250 mL infusion (16 mcg/mL) 4.5 mcg/min (08/17/17 0500)  . fluconazole (DIFLUCAN) IV 400 mg (08/17/17 0451)  . insulin (NOVOLIN-R) infusion 2.2 Units/hr (08/17/17 0458)  . lactated ringers    . lactated ringers 20 mL/hr at 08/17/17 0100  . lactated ringers 20 mL (08/17/17 0522)  . levofloxacin (LEVAQUIN) IV 750 mg (08/17/17 0446)  . milrinone 0.4 mcg/kg/min (08/17/17 0616)  . norepinephrine (LEVOPHED) Adult infusion 2 mcg/min (08/17/17 0600)  .  potassium chloride    . vancomycin 750 mg (08/17/17 0600)    PRN Medications: sodium chloride, lactated ringers, midazolam, morphine injection, ondansetron (ZOFRAN) IV, oxyCODONE, potassium chloride, sodium chloride flush, sodium chloride flush    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   Directly admitted with persistent low cardiac output for milrinone initiation for home.  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 Acute/Chronic systolic CHF with biventricular failure-> cardiogenic shock : - Echo  08/13/2017 EF 20-25%. s/p  Medtronic BiV ICD in place.  - Cath 12/18 with stable 1v CAD.  - s/p HM-3 implant 6/17. Unable to close chest due to high-intrathoracic pressures and RV failure - Remains tenuous despite maximization of inotropes and volume status. CVP high. PA pressures low. Output poor.  - D/w Dr. Prescott Gum -> back to OR today for RVAD. D/w daughter at bedside.  -VAD interrogated personally.   2. [name]  - recent cath with stable CAD as above  3. [name] - Recent A1c 8.3 on 6/3.   Cover with SSI in house.   4. CKD II-III -Creatinine baseline 1.2-1.4. Creatinine 0.9 today - Follow closely post-op  5. [name] - s/p ICD. Quiescent.  - Off sotalol - Keep K>= 4.0 Mg >= 2.0  6. PAF s/p ablation 04/2012 - Device interrogation showed A fib since May 16th.  - Remains in AF  CRITICAL CARE Performed by: Glori Bickers  Total critical care time: 35 minutes  Critical care time was exclusive of separately billable procedures and treating other patients.  Critical care was necessary to treat or prevent imminent or life-threatening deterioration.  Critical care was time spent personally by me (independent of midlevel providers or residents) on the following activities:  development of treatment plan with patient and/or surrogate as well as nursing, discussions with consultants, evaluation of patient's response to treatment, examination of patient, obtaining history from patient or surrogate, ordering and performing treatments and interventions, ordering and review of laboratory studies, ordering and review of radiographic studies, pulse oximetry and re-evaluation of patient's  condition.   Length of Stay: [place], MD  08/17/2017, 6:16 AM  Advanced Heart Failure Team Pager [phone] (M-F; [place])  Please contact [facility] for night-coverage after hours (4p -7a ) and weekends on [email or web address]

## 2017-08-17 NOTE — Progress Notes (Addendum)
LVAD Coordinator Rounding Not  Admitted 08/11/17 for initiation of Milrinone. 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 He was evaluated [facility] /Dr Posey Pronto on 5/9 for possible heart transplant.   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 Prescott Gum under Destination Therapy criteria. Dr [name] discussed heart transplant candidacy with Dr Posey Pronto. Given size and blood type there was concern he would not make it to transplant.   VAD coordinator paged to OR for RVAD placement. Report from [place] (primary nurse) in room 15. Anesthesia and profusion present for case.   Vital signs: Temp:  99.1 HR: 106 A-line:  94/63 (82 Doppler: not done O2 Sat:  100% 1 L N/C Wt: 238>246>242>274 lbs   LVAD interrogation reveals:  Speed: 5300 Flow: 3.6 Power:  3.5w PI:  3.8 Alarms: none Events:77 PI events on 6/17; 37 Pi events 6/18   Hematocrit:  31 Fixed speed: 5300 Low speed limit: 5000  Drive Line: left abdominal dressing dry and intact; anchor in place and accurately applied. Will be changed daily by VAD coordinator, Nurse Davonna Belling, or trained caregiver using gauze dressing with silver strip.   Labs:  LDH trend: 274  INR trend:  1.71  Anticoagulation Plan: -INR Goal: 2.0 - 2.5 -ASA Dose: 325 mg daily until INR therapeutic  Blood Products:  Intra op: - 08/16/17> one platelet; 2 FFP - 08/17/17> ome FFP  Post op: - one unit PCs  Device:  - BiV Medtronic - Therapies: off  Respiratory: vented  Nitric Oxide:  30 ppm  Gtts: - Epi 4.5 mcg/min - Levophed 3 mcg/min - Milrinone 0.4 mcg/kg/min  Adverse Events on VAD: - 08/17/17>mediastinal reexploration with evacuation of mediastinal clot   VAD education:  Pt remains intubated, unable to initiate education  Plan/Recommendations:  1. VAD Coordinator remained with patient and accompanied him back to room 2H21 after mediastinal reexploration with evacuation of mediastinal clot. 2. Page VAD Coordinator with  any VAD equipment or drive line issues.   Zada Girt RN, VAD Coordinator 24/7 VAD Pager: [phone]

## 2017-08-17 NOTE — Progress Notes (Signed)
1 Day Post-Op Procedure(s) (LRB): INSERTION OF IMPLANTABLE LEFT VENTRICULAR ASSIST DEVICE-HM3 (N/A) TRANSESOPHAGEAL ECHOCARDIOGRAM (TEE) (N/A) TRICUSPID VALVE REPAIR using Edwards MC3 Ring size 30 (N/A) Subjective: Patient very marginal thru night despite optimizing RV fx with inotropes, nitric oxide , volume.Multiple PI events. I have discussed the patients condition with Dr [name] [name] for coordination of care and the patients family for consent placement of temporary RVAD [[name] [name] [id number] Objective: Vital signs in last 24 hours: Temp:  [95.7 F (35.4 C)-99.3 F (37.4 C)] 99.1 F (37.3 C) (06/18 0545) Pulse Rate:  [28-150] 89 (06/18 0015) Cardiac Rhythm: Atrial fibrillation (06/18 0500) Resp:  [0-21] 18 (06/18 0545) BP: (80-115)/(60-87) 97/80 (06/18 0530) SpO2:  [99 %-100 %] 100 % (06/18 0500) Arterial Line BP: (66-103)/(54-71) 92/61 (06/18 0545) FiO2 (%):  [50 %-100 %] 50 % (06/18 0500)  Hemodynamic parameters for last 24 hours: PAP: (25-39)/(13-23) 28/17 CVP:  [14 mmHg-26 mmHg] 15 mmHg CO:  [3.6 L/min-4.6 L/min] 3.9 L/min CI:  [1.6 L/min/m2-2 L/min/m2] 1.7 L/min/m2  Intake/Output from previous day: 06/17 0701 - 06/18 0700 In: 1308.6 [I.V.:3191.8; Blood:1851; IV Piggyback:1924.3] Out: [id number] [Urine:2805; Chest Tube:889] Intake/Output this shift: Total I/O In: 2665.2 [I.V.:1271.7; Blood:315; IV Piggyback:1078.5] Out: 5784 [Urine:410; Chest Tube:610]  Sedated on vent  Lab Results: Recent Labs    08/16/17 2100 08/16/17 2109 08/17/17 0314  WBC 7.2  --  9.0  HGB 9.1* 9.5* 10.0*  HCT 28.7* 28.0* 31.2*  PLT 111*  --  119*   BMET:  Recent Labs    08/16/17 1556  08/16/17 2109 08/17/17 0314  NA 137  --  139 134*  K 3.3*  --  4.2 4.4  CL 107  --  103 106  CO2 24  --   --  20*  GLUCOSE 131*  --  95 134*  BUN 10  --  9 9  CREATININE 0.84   < > 0.70 0.90  CALCIUM 8.7*  --   --  8.9   < > = values in this interval not displayed.    PT/INR:  Recent Labs   08/17/17 0314  LABPROT 19.9*  INR 1.71   ABG    Component Value Date/Time   PHART 7.434 08/17/2017 0329   HCO3 19.8 (L) 08/17/2017 0329   TCO2 21 (L) 08/17/2017 0329   ACIDBASEDEF 3.0 (H) 08/17/2017 0329   O2SAT 57.7 08/17/2017 0330   CBG (last 3)  Recent Labs    08/17/17 0201 08/17/17 0328 08/17/17 0456  GLUCAP 148* 142* 100*    Assessment/Plan: S/P Procedure(s) (LRB): INSERTION OF IMPLANTABLE LEFT VENTRICULAR ASSIST DEVICE-HM3 (N/A) TRANSESOPHAGEAL ECHOCARDIOGRAM (TEE) (N/A) TRICUSPID VALVE REPAIR using Edwards MC3 Ring size 30 (N/A) Return to OR  For RVAD   LOS: 6 days    [name] [name] 08/17/2017

## 2017-08-17 NOTE — Progress Notes (Signed)
  Echocardiogram Echocardiogram Transesophageal has been performed.  [name] [name] 08/17/2017, 9:44 AM

## 2017-08-17 NOTE — Progress Notes (Signed)
CSW met with family during throughout the day in the surgical waiting area. Family provided updates from the OR by the VAD Coordinator and coping well under stressful conditions. CSW encouraged family to attend the LVAD Support group this evening for added peer support. Family did attend and were grateful for the support. CSW will continue to follow for supportive intervention throughout implant hospitalization. Raquel Sarna, [place], [place]

## 2017-08-17 NOTE — Op Note (Signed)
NAME: [name] [name], [name] [name] MEDICAL RECORD [id number] ACCOUNT [id number] DATE OF BIRTH:[birth year] FACILITY: [facility] LOCATION: [facility] PHYSICIAN:Kwynn Schlotter VAN TRIGT III, MD  OPERATIVE REPORT  DATE OF PROCEDURE:  08/17/2017  PROCEDURE PERFORMED: 1.  Mediastinal reexploration after previous HeartMate 3 left ventricular assist device implantation. 2.  Evacuation of mediastinal clot--hematoma. 3.  Chest soft tissue closure, but sternum left unclosed for right ventricular dysfunction.  SURGEON:  Len Childs, MD  ASSISTANT:  Kathrynn Running RNFA  PREOPERATIVE DIAGNOSIS:  Right ventricular dysfunction following implantation of HeartMate 3 left ventricular assist device.  POSTOPERATIVE DIAGNOSIS:  Right ventricular dysfunction following implantation of HeartMate 3 left ventricular assist device.  ANESTHESIA:  General by Dr. 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  He had significant RV dysfunction which precluded closure of the sternum in  order to maintain adequate VAD flow and hemodynamics.  The patient did well initially, but after midnight had evidence of right-sided dysfunction with elevated CVP, lowering urine output, and lower VAD flows.  He was posted to return to the operating  room for mediastinal reexploration for the cause of the RV dysfunction with plans being made to place a temporary right ventricular assist device if needed.  I discussed the patient's condition with his wife over the phone and obtained informed consent  for the procedure.  The patient was hemodynamically stable during the transport back to the operating room.  DESCRIPTION OF PROCEDURE:  The patient was placed supine on the operating table and general anesthesia was induced.  A transesophageal echo probe was placed by the anesthesia doctor.  This demonstrated  evidence of a pericardial clot with compression of  the right atrium and right ventricle.  The LVAD cannulation and orientation appeared to be satisfactory/optimal.  The patient was prepped and draped as a sterile field.  A proper timeout was performed.  The previous sternal incision was opened.  The sternum had been previously left open because of the patient's underlying RV dysfunction.  The patient had a moderate  amount of non-clotted blood upon reentering the mediastinum.  A sternal retractor was placed.  The RV was with moderate to severe dysfunction.  A small to moderate sized hematoma between the pericardium and the right atrium was identified and completely  evacuated.  He was irrigated.  The right atrial incisions and suture lines were all hemostatic.  There was some oozing from the pericardium at the site of previous pericardial adhesions, and this was controlled with electrocautery and topical hemostatic  agents.  The inflow cannula site was hemostatic.  The outflow graft was hemostatic.  There are no other sites of bleeding.  The wound was irrigated with antibiotic irrigation.  A new set of mediastinal and left pleural drains were placed and brought out  through the same incisions.  It was decided not to close the sternum because of the patient's moderate to severe RV dysfunction and his problems with VAD flows the previous 6 hours.  For that reason, the sternum was left open, and the soft tissue of the  chest wall was closed with interrupted #1 Vicryl sutures.  The subcutaneous was closed with a running 2-0 Vicryl and skin was closed with a subcuticular.  Sterile dressings were applied, and the patient returned to the ICU in stable condition.  LN/NUANCE  D:08/17/2017 T:08/17/2017 JOB:[id number]

## 2017-08-17 NOTE — Progress Notes (Signed)
Dr. Prescott Gum called and updated on recurrent PI events, current drips, vital signs, cardiac index, and morning labs. Per MD, he will come to bedside to evaluate patient. At this time, reduced respiratory rate from 16 to 14, increase Precedex drip to ensure patient deeply sedated. Will continue to closely monitor. Richarda Blade RN

## 2017-08-17 NOTE — Progress Notes (Signed)
Transported pt from OR to [place] on both Nitric vent and Servo on full support and 100% FIO2, NO at 30 ppm.  Stable throughout transport.

## 2017-08-17 NOTE — Progress Notes (Signed)
CSW met with patient's wife and daughter in the waiting room. They both verbalized understanding of current medical state and reason for return to the OR this morning. Wife states she plans to limit visitors as they have a large family. CSW provided supportive intervention and will continue to follow for support as needed throughout implant hospitalization. Raquel Sarna, [place], [place]

## 2017-08-17 NOTE — Brief Op Note (Signed)
08/11/2017 - 08/17/2017  8:52 AM  PATIENT:  [name] [name]  67 y.o. male  PRE-OPERATIVE DIAGNOSIS:  RV Dysfunction  POST-OPERATIVE DIAGNOSIS:  RV dysfunction  PROCEDURE:  Procedure(s): MEDIASTINAL REXPLORATION with evacuation of hematoma  SURGEON:  Surgeon(s) and Role:    Ivin Poot, MD - Primary  PHYSICIAN ASSISTANT:   ASSISTANTS: Jonne Ply RNFA   ANESTHESIA:   general  EBL:  50 mL   BLOOD ADMINISTERED:one unit FFP  DRAINS: 2 mediastinal, one L pleural   LOCAL MEDICATIONS USED:  NONE  SPECIMEN:  No Specimen  DISPOSITION OF SPECIMEN:  N/A  COUNTS:  YES  TOURNIQUET:  * No tourniquets in log *  DICTATION: .Dragon Dictation  PLAN OF CARE: return to [name] [facility]  PATIENT DISPOSITION:  ICU - intubated and hemodynamically stable.   Delay start of Pharmacological VTE agent (>24hrs) due to surgical blood loss or risk of bleeding: yes

## 2017-08-17 NOTE — Consult Note (Signed)
            [facility] CM Primary Care Navigator  08/17/2017  JERRETT BALDINGER 1950/10/10 [id number]   Attempt to see patient at the bedside to identify possible discharge needs but he was transferred [place] [facility]to [place] [facility](cardiac ICU) after surgery.  Will attempt to see patient at another time when out of ICU.   For additional questions please contact:  Edwena Felty A. Chala Gul, BSN, RN-BC [facility] PRIMARY CARE Navigator Cell: [phone]

## 2017-08-17 NOTE — Care Management Note (Signed)
Case Management Note  Patient Details  Name: [name] [name] MRN: [id number] Date of Birth: 03/26/1950  Subjective/Objective:   Pt admitted for HF - will need home milrinone               Action/Plan:  PTA independent from home with wife.  Pt states he has PCP and denied barriers with obtaining/paying for medications.  Pt informed CM that he weights daily and adheres to a low salt diet.  CM gave [facility] choice for infusion - [facility] agreed to by pt and HF team, agency contacted and tentative referral given.  CM will continue to follow   Expected Discharge Date:  08/13/17               Expected Discharge Plan:  [place]  In-House Referral:     Discharge planning Services  CM Consult  Post Acute Care Choice:    Choice offered to:     DME Arranged:    DME Agency:     HH Arranged:    HH Agency:     Status of Service:     If discussed at [employer], dates discussed:    Additional Comments: 08/17/2017  Pt is now s/p LVAD.  Unfortunately had to return to the OR this am for evacuation of hematoma.  Pt remains on ventilator. CM will continue to follow for discharge needs  08/13/17 Pt will now receive LVAD prior to discharge - will not discharge home on Milrinone Maryclare Labrador, RN 08/17/2017, 3:53 PM

## 2017-08-17 NOTE — Op Note (Signed)
NAME: [name] [name], [name] [name] MEDICAL RECORD GG:[id number] ACCOUNT [id number] DATE OF BIRTH:01-20-1951 FACILITY: [facility] LOCATION: [facility] PHYSICIAN:Aidin Doane VAN TRIGT III, MD  OPERATIVE REPORT  DATE OF PROCEDURE:  08/16/2017  OPERATION: 1.  Implantation of HeartMate 3 left ventricular assist device. 2.  Tricuspid valve repair with an Edwards MC3 annuloplasty ring, 30 mm, serial #[id number].  SURGEON:  Tharon Aquas Trigt III, MD  ASSISTANT:  Lars Pinks, PA-C  PREOPERATIVE DIAGNOSES:  Nonischemic cardiomyopathy, cardiac amyloidosis, advanced heart failure with severe left ventricular dysfunction and moderate to severe right ventricular function.  POSTOPERATIVE DIAGNOSES:  Nonischemic cardiomyopathy, cardiac amyloidosis, advanced heart failure with severe left ventricular dysfunction and moderate to severe right ventricular function.  ANESTHESIA:  General, by Dr. Roberts Gaudy  CLINICAL NOTE:  The patient is a 67 year old African-American male followed for a significant period of time in the [facility] for advanced heart failure with both history of coronary artery disease and ischemic cardiomyopathy as well  as evidence of amyloidosis with cardiomyopathy by PYT scan.  The patient was recently seen in the clinic and found to be with severe fatigue and evidence of low cardiac output.  The patient had been planned to be treated with home milrinone for worsening  cardiac status; however, he was admitted and recommended for implantation of left ventricular assist device on an urgent basis.  The patient was reviewed by the mechanical cardiac support service and found to be an adequate candidate for implantation of  LVAD.  I discussed the procedure of LVAD implantation in detail with the patient and his wife including the indications, benefits, alternatives and risks.  He understood the risks of bleeding, stroke, infection, right ventricular failure requiring  inotropic support or RVAD,  organ failure, and death.  He understood that this was a high-risk operation due to his advanced cardiac failure and underlying amyloidosis.  The patient understood he was not on the transplant list, and the pump was being  placed for destination therapy indication.  He demonstrated his understanding of this information and agreed to proceed with surgery on what I felt was informed consent.  OPERATIVE FINDINGS: 1.  Intraoperative TEE showed moderate to severe tricuspid regurgitation, which I felt would require annuloplasty repair for optimal RV function. 2.  Evidence of biventricular dysfunction on TEE with tricuspid annular displacement, TAPSE, 7 mm. 3.  RV dysfunction following implantation of the LV assist device requiring leaving the sternum open and closing the soft tissue of the chest over the sternum to optimize RV dysfunction.  DESCRIPTION OF PROCEDURE:  The patient was brought directly from preop holding to the operating room and placed supine on the operating table.  Informed consent had been documented and all questions addressed with the patient and family.  Transesophageal echo probe was placed by the anesthesia team.  Invasive monitoring lines were placed.  The patient was placed on inhaled nitric oxide.  The chest, abdomen and legs were prepped with Betadine and draped as a sterile field.  A proper  time-out was performed.  A sternal incision was made.  The sternum was divided.  The patient remained stable.  A counter incision was made in the mid abdomen to the left of the umbilicus and the exit site incision made beneath the left costal margin.  The pericardium was opened and suspended.  The heart was significantly enlarged.  RV function was moderately to severely reduced.  Pursestrings were placed in the ascending aorta and the SVC and IVC for bicaval cannulation.  Heparin  was administered and  ACT was documented as being therapeutic.  Aortic cannulation and cannulation of the SVC and  IVC was then accomplished.  The patient was placed on bypass.  Caval tapes were placed around the SVC and IVC, and the patient was cooled to 32 degrees.  There  were dense adhesions in the pericardium between the pericardium and right ventricle, which were taken down.  Attention to the tricuspid valve was performed first.  The caval tapes were tightened.  The operative field was insufflated with CO2, and a right atriotomy was performed transversely across the right atrial body.  Atrial retractors were placed, and the  tricuspid valve was examined.  The AICD and Luiz Blare were through the tricuspid valve, but neither were adherent.  There was some vegetation-like material on the AICD leads, which were removed and sent for culture.  Ethibond 2-0 annular sutures were placed around the circumference of the tricuspid valve with care being taken to avoid the conduction system.  There were 11 total sutures placed.  The valve was sized to a #30 mm MC3 ring, and the sutures were placed  through the ring and the ring was seated and sutures were tied.  The AICD lead and Swan were through the middle of the ring.  The valve was tested with saline and found to be competent.  The atriotomy was closed in 2 layers using running 4-0 Prolene.  Attention was then directed to the implantation of the HeartMate 3.  The LV apex was exposed.  It was thinned and scarred.  An incision was made in the apex, and then the Foley catheter balloon was placed and the balloon inflated.  The coring knife was  then used to excise a core of LV myocardium.  This was sent for pathology.  The Foley catheter was removed in its entirety.  Trabeculae were trimmed from the endocardium of the left ventricle.  Two Ethibond sutures were placed around the opening in the LV apex numbering 12.  The sewing ring for the inflow cannula placement was then sutured to the epicardium, and all sutures  were tied.  This was covered with a light layer of medical  adhesive--CoSeal.  The volume was then left into the heart and the patient placed in steep Trendelenburg.  The HeartMate 3 pump was brought to the operative field and found to be correctly configured and securely assembled.  The inflow cannula was placed into the sewing  ring and the connecting device engaged.  The inflow cannula and pump were in good orientation.  The pump was then placed down in the pericardium and a small pocket, which had been created prior to heparinization.  The driveline for the pump was then brought out through the 2 incisions in the abdomen and connected to the controller.  Next, the outflow cannula was brought lateral to the 2 venous cannulae, then cut to the appropriate length and beveled for the aortic anastomosis.  A partial occluding clamp was placed on the ascending aorta, and the outflow graft was sewn end-to-side  with running 4-0 Prolene.  This anastomosis was reinforced with a light layer of a medical adhesive--CoSeal.  A needle was placed in the outflow graft between the aorta and the clamp, and the partial clamp on the aorta was released.  Air was vented from the graft and aorta.  The patient was rewarmed.  Temporary pacing wires were applied.  Inotropic support was started.  The ventilator was resumed.  Inhaled nitric oxide was  resumed.  The patient was then transitioned off cardiopulmonary bypass to HeartMate 3 support.  This  occurred smoothly with echo showing good cannulation orientation septal midline position and adequate RV function.  Protamine was administered without adverse reaction.  Venous and arterial cannulas were removed.  The superior pericardial fat was closed  over the aorta and outflow graft.  Anterior mediastinal, left pleural, and VAD pocket drains were placed and brought out through separate incisions.  The sternum was closed.  The patient initially remained stable with good VAD parameters and good  hemodynamics.  Mixed venous saturation was  70% prior to closing the sternum.  The remainder of the chest wall was closed, but associated with that there was increased inotropic support required and some drop in LVAD flow rates and performance.  For that  reason, the sternal wires and chest wound was reopened.  This resulted in improved hemodynamics and bad performance.  The mediastinum was irrigated.  The chest wall soft tissue was closed over the sternum.  The skin was closed.  The patient remained  stable.  He was then transported back to the ICU in stable condition.  Total cardiopulmonary bypass time was 156 minutes.  LN/NUANCE  D:08/17/2017 T:08/17/2017 JOB:[id number]

## 2017-08-18 ENCOUNTER — Inpatient Hospital Stay (HOSPITAL_COMMUNITY): Payer: PPO

## 2017-08-18 LAB — PREPARE FRESH FROZEN PLASMA

## 2017-08-18 LAB — COMPREHENSIVE METABOLIC PANEL
ALT: 16 U/L — ABNORMAL LOW (ref 17–63)
AST: 73 U/L — ABNORMAL HIGH (ref 15–41)
Albumin: 3 g/dL — ABNORMAL LOW (ref 3.5–5.0)
Alkaline Phosphatase: 78 U/L (ref 38–126)
Anion gap: 7 (ref 5–15)
BUN: 7 mg/dL (ref 6–20)
CO2: 21 mmol/L — ABNORMAL LOW (ref 22–32)
Calcium: 8.6 mg/dL — ABNORMAL LOW (ref 8.9–10.3)
Chloride: 106 mmol/L (ref 101–111)
Creatinine, Ser: 0.88 mg/dL (ref 0.61–1.24)
GFR calc Af Amer: >60 mL/min (ref >60)
GFR calc non Af Amer: >60 mL/min (ref >60)
Glucose, Bld: 122 mg/dL — ABNORMAL HIGH (ref 65–99)
Potassium: 4 mmol/L (ref 3.5–5.1)
Sodium: 134 mmol/L — ABNORMAL LOW (ref 135–145)
Total Bilirubin: 1.8 mg/dL — ABNORMAL HIGH (ref 0.3–1.2)
Total Protein: 5.5 g/dL — ABNORMAL LOW (ref 6.5–8.1)

## 2017-08-18 LAB — POCT I-STAT, CHEM 8
BUN: 5 mg/dL — AB (ref 6–20)
CHLORIDE: 101 mmol/L (ref 101–111)
CREATININE: 0.8 mg/dL (ref 0.61–1.24)
Calcium, Ion: 1.26 mmol/L (ref 1.15–1.40)
Glucose, Bld: 129 mg/dL — ABNORMAL HIGH (ref 65–99)
HCT: 27 % — ABNORMAL LOW (ref 39.0–52.0)
Hemoglobin: 9.2 g/dL — ABNORMAL LOW (ref 13.0–17.0)
Potassium: 3.5 mmol/L (ref 3.5–5.1)
SODIUM: 136 mmol/L (ref 135–145)
TCO2: 20 mmol/L — AB (ref 22–32)

## 2017-08-18 LAB — MAGNESIUM: Magnesium: 1.6 mg/dL — ABNORMAL LOW (ref 1.7–2.4)

## 2017-08-18 LAB — GLUCOSE, CAPILLARY
GLUCOSE-CAPILLARY: 115 mg/dL — AB (ref 65–99)
GLUCOSE-CAPILLARY: 117 mg/dL — AB (ref 65–99)
GLUCOSE-CAPILLARY: 121 mg/dL — AB (ref 65–99)
GLUCOSE-CAPILLARY: 123 mg/dL — AB (ref 65–99)
GLUCOSE-CAPILLARY: 124 mg/dL — AB (ref 65–99)
GLUCOSE-CAPILLARY: 124 mg/dL — AB (ref 65–99)
GLUCOSE-CAPILLARY: 136 mg/dL — AB (ref 65–99)
GLUCOSE-CAPILLARY: 137 mg/dL — AB (ref 65–99)
GLUCOSE-CAPILLARY: 147 mg/dL — AB (ref 65–99)
GLUCOSE-CAPILLARY: 151 mg/dL — AB (ref 65–99)
GLUCOSE-CAPILLARY: 154 mg/dL — AB (ref 65–99)
Glucose-Capillary: 120 mg/dL — ABNORMAL HIGH (ref 65–99)
Glucose-Capillary: 117 mg/dL — ABNORMAL HIGH (ref 65–99)
Glucose-Capillary: 157 mg/dL — ABNORMAL HIGH (ref 65–99)
Glucose-Capillary: 138 mg/dL — ABNORMAL HIGH (ref 65–99)
Glucose-Capillary: 125 mg/dL — ABNORMAL HIGH (ref 65–99)
Glucose-Capillary: 124 mg/dL — ABNORMAL HIGH (ref 65–99)

## 2017-08-18 LAB — POCT I-STAT EG7
Acid-base deficit: 1 mmol/L (ref 0.0–2.0)
BICARBONATE: 24.5 mmol/L (ref 20.0–28.0)
CALCIUM ION: 1.24 mmol/L (ref 1.15–1.40)
HCT: 28 % — ABNORMAL LOW (ref 39.0–52.0)
Hemoglobin: 9.5 g/dL — ABNORMAL LOW (ref 13.0–17.0)
O2 SAT: 70 %
PCO2 VEN: 42.7 mmHg — AB (ref 44.0–60.0)
Patient temperature: 36.8
Potassium: 3.1 mmol/L — ABNORMAL LOW (ref 3.5–5.1)
Sodium: 140 mmol/L (ref 135–145)
TCO2: 26 mmol/L (ref 22–32)
pH, Ven: 7.365 (ref 7.250–7.430)
pO2, Ven: 38 mmHg (ref 32.0–45.0)

## 2017-08-18 LAB — CBC WITH DIFFERENTIAL/PLATELET
Abs Immature Granulocytes: 0.1 10*3/uL (ref 0.0–0.1)
Basophils Absolute: 0 10*3/uL (ref 0.0–0.1)
Basophils Relative: 0 %
Eosinophils Absolute: 0.2 10*3/uL (ref 0.0–0.7)
Eosinophils Relative: 2 %
HCT: 27 % — ABNORMAL LOW (ref 39.0–52.0)
Hemoglobin: 8.6 g/dL — ABNORMAL LOW (ref 13.0–17.0)
Immature Granulocytes: 1 %
Lymphocytes Relative: 8 %
Lymphs Abs: 0.7 10*3/uL (ref 0.7–4.0)
MCH: 27.4 pg (ref 26.0–34.0)
MCHC: 31.9 g/dL (ref 30.0–36.0)
MCV: 86 fL (ref 78.0–100.0)
Monocytes Absolute: 1 10*3/uL (ref 0.1–1.0)
Monocytes Relative: 11 %
Neutro Abs: 7.4 10*3/uL (ref 1.7–7.7)
Neutrophils Relative %: 78 %
Platelets: 96 10*3/uL — ABNORMAL LOW (ref 150–400)
RBC: 3.14 MIL/uL — ABNORMAL LOW (ref 4.22–5.81)
RDW: 15.8 % — ABNORMAL HIGH (ref 11.5–15.5)
WBC: 9.4 10*3/uL (ref 4.0–10.5)

## 2017-08-18 LAB — POCT I-STAT 7, (LYTES, BLD GAS, ICA,H+H)
Acid-base deficit: 3 mmol/L — ABNORMAL HIGH (ref 0.0–2.0)
Bicarbonate: 23.8 mmol/L (ref 20.0–28.0)
Calcium, Ion: 1.3 mmol/L (ref 1.15–1.40)
HEMATOCRIT: 34 % — AB (ref 39.0–52.0)
HEMOGLOBIN: 11.6 g/dL — AB (ref 13.0–17.0)
O2 Saturation: 91 %
PCO2 ART: 47.3 mmHg (ref 32.0–48.0)
Patient temperature: 36.2
Potassium: 3.1 mmol/L — ABNORMAL LOW (ref 3.5–5.1)
Sodium: 140 mmol/L (ref 135–145)
TCO2: 25 mmol/L (ref 22–32)
pH, Arterial: 7.306 — ABNORMAL LOW (ref 7.350–7.450)
pO2, Arterial: 65 mmHg — ABNORMAL LOW (ref 83.0–108.0)

## 2017-08-18 LAB — BPAM FFP
BLOOD PRODUCT EXPIRATION DATE: 201906222359
BLOOD PRODUCT EXPIRATION DATE: 201906222359
ISSUE DATE / TIME: 201906180749
ISSUE DATE / TIME: 201906182317
UNIT TYPE AND RH: 6200
UNIT TYPE AND RH: 6200

## 2017-08-18 LAB — POCT I-STAT 3, ART BLOOD GAS (G3+)
Acid-Base Excess: 1 mmol/L (ref 0.0–2.0)
BICARBONATE: 24.3 mmol/L (ref 20.0–28.0)
O2 Saturation: 99 %
PCO2 ART: 34.5 mmHg (ref 32.0–48.0)
TCO2: 25 mmol/L (ref 22–32)
pH, Arterial: 7.457 — ABNORMAL HIGH (ref 7.350–7.450)
pO2, Arterial: 110 mmHg — ABNORMAL HIGH (ref 83.0–108.0)

## 2017-08-18 LAB — COOXEMETRY PANEL
Carboxyhemoglobin: 1.6 % — ABNORMAL HIGH (ref 0.5–1.5)
Carboxyhemoglobin: 1.6 % — ABNORMAL HIGH (ref 0.5–1.5)
Methemoglobin: 1.9 % — ABNORMAL HIGH (ref 0.0–1.5)
Methemoglobin: 1.8 % — ABNORMAL HIGH (ref 0.0–1.5)
O2 Saturation: 66.9 %
O2 Saturation: 69.5 %
Total hemoglobin: 8.7 g/dL — ABNORMAL LOW (ref 12.0–16.0)
Total hemoglobin: 8.2 g/dL — ABNORMAL LOW (ref 12.0–16.0)

## 2017-08-18 LAB — APTT
aPTT: 62 seconds — ABNORMAL HIGH (ref 24–36)
aPTT: 78 seconds — ABNORMAL HIGH (ref 24–36)

## 2017-08-18 LAB — HEPARIN LEVEL (UNFRACTIONATED): Heparin Unfractionated: <0.1 IU/mL — ABNORMAL LOW (ref 0.30–0.70)

## 2017-08-18 LAB — PROTIME-INR
INR: 1.92
Prothrombin Time: 21.8 seconds — ABNORMAL HIGH (ref 11.4–15.2)

## 2017-08-18 LAB — PHOSPHORUS: Phosphorus: 2.5 mg/dL (ref 2.5–4.6)

## 2017-08-18 LAB — LACTATE DEHYDROGENASE: LDH: 287 U/L — ABNORMAL HIGH (ref 98–192)

## 2017-08-18 MED ORDER — MAGNESIUM SULFATE 4 GM/100ML IV SOLN
4.0000 g | Freq: Once | INTRAVENOUS | Status: DC
  Filled 2017-08-18: qty 100

## 2017-08-18 MED ORDER — HEPARIN (PORCINE) IN NACL 100-0.45 UNIT/ML-% IJ SOLN
900.0000 [IU]/h | INTRAMUSCULAR | Status: DC
  Administered 2017-08-19: 900 [IU]/h via INTRAVENOUS
  Filled 2017-08-18: qty 250

## 2017-08-18 MED ORDER — INSULIN ASPART 100 UNIT/ML ~~LOC~~ SOLN
0.0000 [IU] | SUBCUTANEOUS | Status: DC
  Administered 2017-08-18 (×2): 2 [IU] via SUBCUTANEOUS
  Administered 2017-08-18: 4 [IU] via SUBCUTANEOUS
  Administered 2017-08-18 – 2017-08-19 (×2): 2 [IU] via SUBCUTANEOUS
  Administered 2017-08-19 (×2): 4 [IU] via SUBCUTANEOUS
  Administered 2017-08-19: 2 [IU] via SUBCUTANEOUS
  Administered 2017-08-20 (×2): 4 [IU] via SUBCUTANEOUS
  Administered 2017-08-20: 8 [IU] via SUBCUTANEOUS
  Administered 2017-08-20: 2 [IU] via SUBCUTANEOUS
  Administered 2017-08-20: 8 [IU] via SUBCUTANEOUS
  Administered 2017-08-20: 4 [IU] via SUBCUTANEOUS
  Administered 2017-08-21 (×2): 8 [IU] via SUBCUTANEOUS
  Administered 2017-08-21: 12 [IU] via SUBCUTANEOUS
  Administered 2017-08-21: 8 [IU] via SUBCUTANEOUS
  Administered 2017-08-21 (×2): 12 [IU] via SUBCUTANEOUS
  Administered 2017-08-22 (×6): 8 [IU] via SUBCUTANEOUS
  Administered 2017-08-22: 12 [IU] via SUBCUTANEOUS
  Administered 2017-08-23: 8 [IU] via SUBCUTANEOUS
  Administered 2017-08-23: 12 [IU] via SUBCUTANEOUS
  Administered 2017-08-23: 8 [IU] via SUBCUTANEOUS
  Administered 2017-08-23: 24 [IU] via SUBCUTANEOUS
  Administered 2017-08-24: 4 [IU] via SUBCUTANEOUS
  Administered 2017-08-24 (×2): 8 [IU] via SUBCUTANEOUS
  Administered 2017-08-24: 2 [IU] via SUBCUTANEOUS
  Administered 2017-08-24 – 2017-08-25 (×3): 4 [IU] via SUBCUTANEOUS
  Administered 2017-08-25: 2 [IU] via SUBCUTANEOUS
  Administered 2017-08-25: 4 [IU] via SUBCUTANEOUS
  Administered 2017-08-25: 8 [IU] via SUBCUTANEOUS
  Administered 2017-08-25: 4 [IU] via SUBCUTANEOUS
  Administered 2017-08-26: 2 [IU] via SUBCUTANEOUS
  Administered 2017-08-26: 4 [IU] via SUBCUTANEOUS
  Administered 2017-08-26: 8 [IU] via SUBCUTANEOUS
  Administered 2017-08-26 (×2): 2 [IU] via SUBCUTANEOUS
  Administered 2017-08-26 – 2017-08-27 (×2): 4 [IU] via SUBCUTANEOUS
  Administered 2017-08-27 (×3): 8 [IU] via SUBCUTANEOUS
  Administered 2017-08-27 – 2017-08-28 (×3): 4 [IU] via SUBCUTANEOUS
  Administered 2017-08-28: 20 [IU] via SUBCUTANEOUS
  Administered 2017-08-28: 8 [IU] via SUBCUTANEOUS
  Administered 2017-08-29: 2 [IU] via SUBCUTANEOUS
  Administered 2017-08-29: 8 [IU] via SUBCUTANEOUS
  Administered 2017-08-29 – 2017-08-30 (×6): 2 [IU] via SUBCUTANEOUS
  Administered 2017-08-30: 4 [IU] via SUBCUTANEOUS
  Administered 2017-08-31: 8 [IU] via SUBCUTANEOUS
  Administered 2017-08-31 – 2017-09-01 (×2): 2 [IU] via SUBCUTANEOUS
  Administered 2017-09-01 (×2): 8 [IU] via SUBCUTANEOUS
  Administered 2017-09-01 – 2017-09-02 (×2): 4 [IU] via SUBCUTANEOUS
  Administered 2017-09-02 (×2): 2 [IU] via SUBCUTANEOUS
  Administered 2017-09-02: 4 [IU] via SUBCUTANEOUS
  Administered 2017-09-02 – 2017-09-03 (×2): 2 [IU] via SUBCUTANEOUS
  Administered 2017-09-03: 4 [IU] via SUBCUTANEOUS
  Administered 2017-09-03: 8 [IU] via SUBCUTANEOUS
  Administered 2017-09-03: 2 [IU] via SUBCUTANEOUS
  Administered 2017-09-04: 4 [IU] via SUBCUTANEOUS
  Administered 2017-09-04 (×2): 2 [IU] via SUBCUTANEOUS
  Administered 2017-09-05 – 2017-09-06 (×4): 4 [IU] via SUBCUTANEOUS
  Administered 2017-09-06: 2 [IU] via SUBCUTANEOUS

## 2017-08-18 MED ORDER — MAGNESIUM SULFATE 2 GM/50ML IV SOLN
2.0000 g | Freq: Once | INTRAVENOUS | Status: AC
  Administered 2017-08-18: 2 g via INTRAVENOUS

## 2017-08-18 MED ORDER — POTASSIUM CHLORIDE 10 MEQ/50ML IV SOLN
10.0000 meq | Freq: Once | INTRAVENOUS | Status: AC
  Administered 2017-08-18: 10 meq via INTRAVENOUS

## 2017-08-18 MED ORDER — ALBUMIN HUMAN 5 % IV SOLN
INTRAVENOUS | Status: AC
  Administered 2017-08-18: 12.5 g
  Filled 2017-08-18: qty 250

## 2017-08-18 MED ORDER — VANCOMYCIN HCL IN DEXTROSE 750-5 MG/150ML-% IV SOLN
750.0000 mg | Freq: Two times a day (BID) | INTRAVENOUS | Status: DC
  Administered 2017-08-18 (×2): 750 mg via INTRAVENOUS
  Filled 2017-08-18 (×3): qty 150

## 2017-08-18 MED ORDER — SODIUM CHLORIDE 0.9 % IV SOLN
INTRAVENOUS | Status: DC
  Administered 2017-08-18: 4.5 [IU]/h via INTRAVENOUS

## 2017-08-18 MED ORDER — POTASSIUM CHLORIDE 10 MEQ/50ML IV SOLN
10.0000 meq | INTRAVENOUS | Status: AC
  Administered 2017-08-18 (×2): 10 meq via INTRAVENOUS

## 2017-08-18 MED ORDER — INSULIN DETEMIR 100 UNIT/ML ~~LOC~~ SOLN
14.0000 [IU] | Freq: Two times a day (BID) | SUBCUTANEOUS | Status: DC
  Administered 2017-08-18 (×2): 14 [IU] via SUBCUTANEOUS
  Filled 2017-08-18 (×3): qty 0.14

## 2017-08-18 MED ORDER — FUROSEMIDE 10 MG/ML IJ SOLN
8.0000 mg/h | INTRAMUSCULAR | Status: DC
  Administered 2017-08-18 – 2017-08-19 (×2): 8 mg/h via INTRAVENOUS
  Filled 2017-08-18: qty 21
  Filled 2017-08-18 (×2): qty 25

## 2017-08-18 MED FILL — Heparin Sodium (Porcine) Inj 1000 Unit/ML: INTRAMUSCULAR | Qty: 30 | Status: AC

## 2017-08-18 MED FILL — Sodium Chloride IV Soln 0.9%: INTRAVENOUS | Qty: 1000 | Status: AC

## 2017-08-18 NOTE — Addendum Note (Signed)
Addendum  created 08/18/17 0656 by Roberts Gaudy, MD   Sign clinical note

## 2017-08-18 NOTE — Progress Notes (Signed)
Anesthesiology Follow-up:  Events of yesterday noted.  Patient brought back to the OR and found to have mediastinal hematoma which was evacuated.  He appears more stable at present. No suction or PI events overnight. Requiring less inotropic support. He remains sedated on the vent with skin closure of the surgical wound but the chest remains open.   Epinephrine 1.5 mcg/kg/min Norepi 2.5 mcg/kg/min  VS: T- 37.5 BP 88/75 (81) Hr- 75-90 (Afib) PA 42/21 CVP-14 CI-2.2  Vent settings: PRVC 600 RR-16 FiO2- 0.5 Peep-5  VAD: Pump Speed: 5300 Flow 4.0 LPM PI: 2.7  Power: 4 watts  PH 7.47 PCO2 36 PO2 106  BUN/Cr.- 7/0.88 glucose- 122 Na- 134 K-4.0 H/H- 8.6/27 Platelets- 96,000 WBC- 9,400  Patient more stable over night following mediastinal hematoma evacuation. Will need to return to OR for sternal closure before weaning vent.  Roberts Gaudy

## 2017-08-18 NOTE — Progress Notes (Signed)
PT Cancellation Note  Patient Details Name: [name] [name] MRN: [id number] DOB: February 19, 1951   Cancelled Treatment:    Reason Eval/Treat Not Completed: Patient not medically ready. Pt had to go back to the OR late yesterday for evacuation of mediastinal hematoma. Pt remains sedated and intubated. Acute PT to return as able, as appropriate to complete PT Waymon Budge, PT, DPT Pager #: [phone] Office #: [phone]    [place] 08/18/2017, 7:59 AM

## 2017-08-18 NOTE — Progress Notes (Signed)
CSW met with wife at bedside. Patient stable on vent and wife states she will continue to limit visitors to allow patient to rest and recover. RN provided update and invited wife to watch dressing change. Wife pleased to take part in care and relieved to be at patient's side. Wife appears to be coping well under very stressful conditions. CSW continues to provide supportive intervention and follow throughout implant hospitalization. Raquel Sarna, [place], [place]

## 2017-08-18 NOTE — Progress Notes (Signed)
OT Cancellation Note  Patient Details Name: [name] [name] MRN: [id number] DOB: 1950/11/26   Cancelled Treatment:    Reason Eval/Treat Not Completed: Patient not medically ready.  Malka So 08/18/2017, 8:14 AM  08/18/2017 Nestor Lewandowsky, OTR/L Pager: [phone]

## 2017-08-18 NOTE — Progress Notes (Addendum)
LVAD Coordinator Rounding Not  Admitted 08/11/17 for initiation of Milrinone. 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 He was evaluated [facility] /Dr Posey Pronto on 5/9 for possible heart transplant.   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 Prescott Gum under Destination Therapy criteria. Dr [name] discussed heart transplant candidacy with Dr Posey Pronto. Given size and blood type there was concern he would not make it to transplant.   Pt remains intubated and sedated.   Vital signs: Temp:  99.3 HR: 91 A-line:  76/54 (67) Doppler: not done O2 Sat:  100% on ventilator Wt: 238>246>242>274 lbs   LVAD interrogation reveals:  Speed: 5300 Flow: 4.4 Power:  3.6w PI:  1.7 Alarms: none Events: 79 PI events Hematocrit:  27 Fixed speed: 5300 Low speed limit: 5100  Drive Line: left abdominal dressing dry and intact; anchor in place and accurately applied. Will be changed daily by VAD coordinator, Nurse Davonna Belling, or trained caregiver using gauze dressing with silver strip.   Labs:  LDH trend: 274>287  INR trend:  1.71>1.92  Anticoagulation Plan: -INR Goal: 2.0 - 2.5 -ASA Dose: 325 mg daily until INR therapeutic  Blood Products:  Intra op: - 08/16/17> one platelet; 2 FFP - 08/17/17> ome FFP  Post op: - 08/16/17 > one unit PCs  Device:  - BiV Medtronic - Therapies: off  Respiratory: vented  Nitric Oxide:  30 ppm  Gtts: - Epi 1.5 mcg/min - Levophed 1 mcg/min - Milrinone 0.5 mcg/kg/min  Adverse Events on VAD: - 08/17/17>mediastinal reexploration with evacuation of mediastinal clot  VAD education:  Pt remains intubated, unable to initiate education  Plan/Recommendations:  1. VAD Davonna Belling will change drive line dressing today; wife to observe. 2. Page VAD Coordinator with any VAD equipment or drive line issues.   Zada Girt RN, VAD Coordinator 24/7 VAD Pager: [phone]

## 2017-08-18 NOTE — Progress Notes (Addendum)
Advanced Heart Failure Rounding Note  PCP-Cardiologist: No primary care provider on file.   Subjective:    Events 6/17 Underwent HM-3 implant -> Unable to close chest with RV failure and high intrathoracic pressure 6/18 Taken back to OR for evacuation of hematoma. NO RVAD needed. Left sternum open   Drips Milrinone 0.5  Epi 1.5 NE 1  Vent: NO 30 ppm, FiO2 50%, PEEP 5  About 1 L of UOP overnight. CVP 13-14 this am.  VAD parameters have improved since evacuation of hematoma  Remains intubated and sedated this am. Sternum remains open (skin closed)  Swan#s CVP 13-14 PA 35/17 (23) Thermo 4.94/2.16  LVAD Interrogation HM 3: Speed: 5300 Flow: 4.2 PI: 2.5 Power: 4.0. Occasional PI events  Objective:   Weight Range: 274 lb 0.5 oz (124.3 kg) Body mass index is 38.24 kg/m.   Vital Signs:   Temp:  [95.9 F (35.5 C)-99.7 F (37.6 C)] 99.7 F (37.6 C) (06/19 0744) Pulse Rate:  [28-92] 64 (06/19 0744) Resp:  [13-21] 13 (06/19 0744) BP: (77-121)/(53-97) 94/81 (06/19 0700) SpO2:  [98 %-100 %] 100 % (06/19 0744) Arterial Line BP: (77-195)/(53-186) 90/76 (06/19 0700) FiO2 (%):  [50 %-100 %] 50 % (06/19 0744) Weight:  [274 lb 0.5 oz (124.3 kg)] 274 lb 0.5 oz (124.3 kg) (06/19 0430) Last BM Date: 08/16/17  Weight change: Filed Weights   08/15/17 0600 08/16/17 0430 08/18/17 0430  Weight: 246 lb 14.6 oz (112 kg) 242 lb 11.6 oz (110.1 kg) 274 lb 0.5 oz (124.3 kg)    Intake/Output:   Intake/Output Summary (Last 24 hours) at 08/18/2017 0756 Last data filed at 08/18/2017 0700 Gross per 24 hour  Intake 6084.57 ml  Output 2190 ml  Net 3894.57 ml      Physical Exam    GENERAL: Intubated/sedated.  HEENT: +ETT NECK: Supple, JVP elevated. RIJ swan. Carotids OK.  CARDIAC:  Mechanical heart sounds with LVAD hum present. Dressing  LUNGS:  + Mechanical breathing sounds. + Coarse. + CTs with serosanguinous drainage. ABDOMEN:  NT, ND, no HSM. No bruits or masses. +BS  LVAD  exit site: Dressing dry and intact.  EXTREMITIES:  Warm and dry. No cyanosis, clubbing, or rash. 1-2+ edema. NEUROLOGIC: Intubated/Sedated.   Telemetry   V paced 90s, personally reviewed.   Labs    CBC Recent Labs    08/17/17 0314  08/17/17 1649 08/17/17 1653 08/18/17 0302  WBC 9.0   < > 8.6  --  9.4  NEUTROABS 7.3  --   --   --  7.4  HGB 10.0*   < > 8.4* 8.8* 8.6*  HCT 31.2*   < > 26.4* 26.0* 27.0*  MCV 85.5   < > 85.7  --  86.0  PLT 119*   < > 92*  --  96*   < > = values in this interval not displayed.   Basic Metabolic Panel Recent Labs    08/17/17 0314  08/17/17 0940  08/17/17 1649 08/17/17 1653 08/18/17 0302  NA 134*   < > 136   < >  --  137 134*  K 4.4   < > 4.0   < >  --  4.3 4.0  CL 106   < > 109   < >  --  103 106  CO2 20*  --  22  --   --   --  21*  GLUCOSE 134*   < > 115*   < >  --  103* 122*  BUN 9   < > 9   < >  --  9 7  CREATININE 0.90   < > 0.91   < > 0.89 0.70 0.88  CALCIUM 8.9  --  8.8*  --   --   --  8.6*  MG 1.9  --   --   --  1.7  --  1.6*  PHOS 3.5  --   --   --   --   --  2.5   < > = values in this interval not displayed.   Liver Function Tests Recent Labs    08/17/17 0314 08/18/17 0302  AST 68* 73*  ALT 19 16*  ALKPHOS 45 78  BILITOT 2.7* 1.8*  PROT 5.9* 5.5*  ALBUMIN 3.3* 3.0*   No results for input(s): LIPASE, AMYLASE in the last 72 hours. Cardiac Enzymes No results for input(s): CKTOTAL, CKMB, CKMBINDEX, TROPONINI in the last 72 hours.  BNP: BNP (last 3 results) Recent Labs    01/15/17 0911 08/11/17 1438 08/17/17 0314  BNP 159.6* 529.3* 266.4*    ProBNP (last 3 results) Recent Labs    11/12/16 1559  PROBNP 388.0*     D-Dimer Recent Labs    08/16/17 1733  DDIMER 2.61*   Hemoglobin A1C No results for input(s): HGBA1C in the last 72 hours. Fasting Lipid Panel No results for input(s): CHOL, HDL, LDLCALC, TRIG, CHOLHDL, LDLDIRECT in the last 72 hours. Thyroid Function Tests No results for input(s): TSH,  T4TOTAL, T3FREE, THYROIDAB in the last 72 hours.  Invalid input(s): FREET3  Other results:   Imaging    Dg Chest Portable 1 View  Result Date: 08/17/2017 CLINICAL DATA:  67 year old male post LVAD.  Subsequent encounter. EXAM: PORTABLE CHEST 1 VIEW COMPARISON:  08/18/2015 4:31 a.m. chest x-ray FINDINGS: Endotracheal tube tip 3.2 cm above the carina. Gordy Councilman catheter tip pulmonary outflow/main pulmonary artery level. Right central line tip distal superior vena cava. Nasogastric tube courses below the diaphragm. Tip is not included on the present exam. Sequential biventricular pacer/AICD with leads unchanged in position. Left ventricular assist device in place. This is not completely imaged. The portion which is visualized appears similar to prior exam. Left-sided chest tube in place. Inferolateral aspect of chest not included on present exam. Taking this limitation into account, no pneumothorax noted. Cardiomegaly. Slightly asymmetric pulmonary edema without significant change since prior examination. Prominence right infrahilar region may be projectional. IMPRESSION: Slightly asymmetric pulmonary edema relatively similar to prior exam. Mild prominence of right infrahilar region may be related to overlapping structures. Remainder of exam without significant change as detailed above. Electronically Signed   By: Genia Del M.D.   On: 08/17/2017 09:35     Medications:     Scheduled Medications: . acetaminophen  1,000 mg Oral Q6H   Or  . acetaminophen (TYLENOL) oral liquid 160 mg/5 mL  1,000 mg Per Tube Q6H  . aspirin EC  325 mg Oral Daily   Or  . aspirin  324 mg Per Tube Daily   Or  . aspirin  300 mg Rectal Daily  . bisacodyl  10 mg Oral Daily   Or  . bisacodyl  10 mg Rectal Daily  . chlorhexidine gluconate (MEDLINE KIT)  15 mL Mouth Rinse BID  . Chlorhexidine Gluconate Cloth  6 each Topical Daily  . insulin regular  0-10 Units Intravenous TID WC  . levalbuterol  0.63 mg  Nebulization Q6H  . mouth rinse  15 mL  Mouth Rinse 10 times per day  . metoCLOPramide (REGLAN) injection  10 mg Intravenous Q6H  . rifampin  600 mg Oral Once  . sodium chloride flush  10-40 mL Intracatheter Q12H    Infusions: . sodium chloride 10 mL/hr at 08/18/17 0700  . sodium chloride    . sodium chloride 20 mL/hr at 08/17/17 1008  . dexmedetomidine (PRECEDEX) IV infusion 1.2 mcg/kg/hr (08/18/17 0700)  . EPINEPHrine 4 mg in dextrose 5% 250 mL infusion (16 mcg/mL) 1.5 mcg/min (08/18/17 0700)  . famotidine (PEPCID) IV Stopped (08/17/17 2212)  . fentaNYL infusion INTRAVENOUS 200 mcg/hr (08/18/17 0733)  . heparin 800 Units/hr (08/18/17 0700)  . insulin (NOVOLIN-R) infusion 2.7 mL/hr at 08/18/17 0700  . lactated ringers    . lactated ringers 10 mL/hr at 08/18/17 0700  . lactated ringers 10 mL/hr at 08/18/17 0700  . lactated ringers Stopped (08/18/17 0527)  . levofloxacin (LEVAQUIN) IV 100 mL/hr at 08/18/17 0700  . milrinone 0.5 mcg/kg/min (08/18/17 0702)  . norepinephrine (LEVOPHED) Adult infusion 2 mcg/min (08/18/17 0700)  . potassium chloride 10 mEq (08/17/17 1114)  . vancomycin Stopped (08/17/17 2132)    PRN Medications: sodium chloride, fentaNYL, lactated ringers, lactated ringers, midazolam, morphine injection, ondansetron (ZOFRAN) IV, oxyCODONE, potassium chloride, sodium chloride flush    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   Directly admitted with persistent low cardiac output for milrinone initiation for home.  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 Acute/Chronic systolic CHF with biventricular failure-> cardiogenic shock : - Echo  08/13/2017 EF 20-25%. s/p  Medtronic BiV ICD in place.  - Cath 12/18 with stable 1v CAD.  - s/p HM-3 implant 6/17. Unable to close chest due to  high-intrathoracic pressures and RV failure - Taken back to OR 08/17/17 with evacuation of hematoma with improvement - CVP 13-14.  - VAD interrogated personally. Parameters stable.    2. [name]  - recent cath with stable CAD as above - No change to current plan.    3. [name] - Recent A1c 8.3 on 6/3.  - Cover with SSI in house.   4. CKD II-III - Creatinine baseline 1.2-1.4. Creatinine 0.88 today - Follow closely.  5. [name] - s/p ICD. Quiescent.  - Off sotalol - Keep K>= 4.0 Mg >= 2.0. Supp Mg  6. PAF s/p ablation 04/2012 - Device interrogation showed A fib since May 16th.  - Remains in AF  Length of Stay: 7  Annamaria Helling  08/18/2017, 7:56 AM  Advanced Heart Failure Team Pager [phone] (M-F; 7a - 4p)  Please contact [facility] for night-coverage after hours (4p -7a ) and weekends on [email or web address]   Agree with above.   67 y/o male with severe systolic HF due to NICM underwent VAD placement on 6/17. Complicated by severe RV failure/tamponade. Back to OR yesterday for evacuation of clots. Remains intubated/sedated. On NO 30. Epi 1.5 NE 1.5 and milrinone. Lasix dirp started today and has good urine output. Swan numbers improved. Flows on VAD look good. Chest still open  Exam Intubated/sedated +ETT JVP to jaw Sternal dressing +CT x3 Ab distended hypoactive BS Driveline site ok Ext 2-3+ edema  Remains critically ill but progressing with VAD support. Continue NO  and inotropes for now. Co-ox look good. Renal function stable. He is responding well to IV lasix. Will continue. Supp Mg. VAD interrogated personally. Parameters stable.  CRITICAL CARE Performed by: Glori Bickers  Total critical care time: 35 minutes  Critical care time was exclusive of separately billable procedures and treating other patients.  Critical care was necessary to treat or prevent imminent or life-threatening deterioration.  Critical care was time spent  personally by me (independent of midlevel providers or residents) on the following activities: development of treatment plan with patient and/or surrogate as well as nursing, discussions with consultants, evaluation of patient's response to treatment, examination of patient, obtaining history from patient or surrogate, ordering and performing treatments and interventions, ordering and review of laboratory studies, ordering and review of radiographic studies, pulse oximetry and re-evaluation of patient's condition.  Glori Bickers, MD  7:03 PM

## 2017-08-19 ENCOUNTER — Inpatient Hospital Stay (HOSPITAL_COMMUNITY): Payer: PPO

## 2017-08-19 ENCOUNTER — Encounter (HOSPITAL_COMMUNITY)
Admission: AD | Disposition: A | Discharge status: Rehabilitation Facility | Payer: Self-pay | Source: Home / Self Care | Attending: Internal Medicine

## 2017-08-19 ENCOUNTER — Inpatient Hospital Stay (HOSPITAL_COMMUNITY): Payer: PPO | Admitting: Certified Registered Nurse Anesthetist

## 2017-08-19 ENCOUNTER — Encounter (HOSPITAL_COMMUNITY): Payer: Self-pay | Admitting: Cardiothoracic Surgery

## 2017-08-19 DIAGNOSIS — Z95811 Presence of heart assist device: Secondary | ICD-10-CM

## 2017-08-19 DIAGNOSIS — I5022 Chronic systolic (congestive) heart failure: Secondary | ICD-10-CM

## 2017-08-19 DIAGNOSIS — I9789 Other postprocedural complications and disorders of the circulatory system, not elsewhere classified: Secondary | ICD-10-CM

## 2017-08-19 HISTORY — PX: TEE WITHOUT CARDIOVERSION: SHX5443

## 2017-08-19 HISTORY — PX: STERNAL CLOSURE: SHX6203

## 2017-08-19 LAB — BPAM RBC
Blood Product Expiration Date: 201906222359
Blood Product Expiration Date: 201907142359
Blood Product Expiration Date: 201907142359
Blood Product Expiration Date: 201907142359
Blood Product Expiration Date: 201907142359
ISSUE DATE / TIME: 201906172243
ISSUE DATE / TIME: 201906180730
ISSUE DATE / TIME: 201906180730
ISSUE DATE / TIME: 201906180730
ISSUE DATE / TIME: 201906181513
Unit Type and Rh: 5100
Unit Type and Rh: 5100
Unit Type and Rh: 5100
Unit Type and Rh: 5100
Unit Type and Rh: 5100

## 2017-08-19 LAB — GLUCOSE, CAPILLARY
GLUCOSE-CAPILLARY: 138 mg/dL — AB (ref 65–99)
GLUCOSE-CAPILLARY: 172 mg/dL — AB (ref 65–99)
GLUCOSE-CAPILLARY: 160 mg/dL — AB (ref 65–99)
Glucose-Capillary: 152 mg/dL — ABNORMAL HIGH (ref 65–99)
Glucose-Capillary: 169 mg/dL — ABNORMAL HIGH (ref 65–99)
Glucose-Capillary: 173 mg/dL — ABNORMAL HIGH (ref 65–99)

## 2017-08-19 LAB — POCT I-STAT, CHEM 8
BUN: 6 mg/dL (ref 6–20)
BUN: 4 mg/dL — ABNORMAL LOW (ref 6–20)
CALCIUM ION: 1.24 mmol/L (ref 1.15–1.40)
CALCIUM ION: 0.98 mmol/L — AB (ref 1.15–1.40)
Chloride: 98 mmol/L — ABNORMAL LOW (ref 101–111)
Chloride: 98 mmol/L — ABNORMAL LOW (ref 101–111)
Creatinine, Ser: 0.7 mg/dL (ref 0.61–1.24)
Creatinine, Ser: 0.6 mg/dL — ABNORMAL LOW (ref 0.61–1.24)
GLUCOSE: 141 mg/dL — AB (ref 65–99)
Glucose, Bld: 165 mg/dL — ABNORMAL HIGH (ref 65–99)
HCT: 28 % — ABNORMAL LOW (ref 39.0–52.0)
HEMATOCRIT: 27 % — AB (ref 39.0–52.0)
Hemoglobin: 9.2 g/dL — ABNORMAL LOW (ref 13.0–17.0)
Hemoglobin: 9.5 g/dL — ABNORMAL LOW (ref 13.0–17.0)
Potassium: 3.3 mmol/L — ABNORMAL LOW (ref 3.5–5.1)
Potassium: 2.9 mmol/L — ABNORMAL LOW (ref 3.5–5.1)
SODIUM: 136 mmol/L (ref 135–145)
Sodium: 151 mmol/L — ABNORMAL HIGH (ref 135–145)
TCO2: 22 mmol/L (ref 22–32)
TCO2: 21 mmol/L — ABNORMAL LOW (ref 22–32)

## 2017-08-19 LAB — POCT I-STAT 3, ART BLOOD GAS (G3+)
Acid-base deficit: 2 mmol/L (ref 0.0–2.0)
Bicarbonate: 22.7 mmol/L (ref 20.0–28.0)
O2 Saturation: 99 %
PH ART: 7.386 (ref 7.350–7.450)
PO2 ART: 150 mmHg — AB (ref 83.0–108.0)
Patient temperature: 37.1
TCO2: 24 mmol/L (ref 22–32)
pCO2 arterial: 37.8 mmHg (ref 32.0–48.0)

## 2017-08-19 LAB — POCT I-STAT EG7
BICARBONATE: 24.7 mmol/L (ref 20.0–28.0)
Calcium, Ion: 1.18 mmol/L (ref 1.15–1.40)
HCT: 28 % — ABNORMAL LOW (ref 39.0–52.0)
Hemoglobin: 9.5 g/dL — ABNORMAL LOW (ref 13.0–17.0)
O2 Saturation: 74 %
PH VEN: 7.422 (ref 7.250–7.430)
POTASSIUM: 3.3 mmol/L — AB (ref 3.5–5.1)
Patient temperature: 36.4
SODIUM: 135 mmol/L (ref 135–145)
TCO2: 26 mmol/L (ref 22–32)
pCO2, Ven: 37.7 mmHg — ABNORMAL LOW (ref 44.0–60.0)
pO2, Ven: 37 mmHg (ref 32.0–45.0)

## 2017-08-19 LAB — COMPREHENSIVE METABOLIC PANEL
ALT: 16 U/L — ABNORMAL LOW (ref 17–63)
AST: 48 U/L — ABNORMAL HIGH (ref 15–41)
Albumin: 2.6 g/dL — ABNORMAL LOW (ref 3.5–5.0)
Alkaline Phosphatase: 49 U/L (ref 38–126)
Anion gap: 7 (ref 5–15)
BUN: 7 mg/dL (ref 6–20)
CO2: 24 mmol/L (ref 22–32)
Calcium: 8.4 mg/dL — ABNORMAL LOW (ref 8.9–10.3)
Chloride: 104 mmol/L (ref 101–111)
Creatinine, Ser: 0.89 mg/dL (ref 0.61–1.24)
GFR calc Af Amer: >60 mL/min (ref >60)
GFR calc non Af Amer: >60 mL/min (ref >60)
Glucose, Bld: 160 mg/dL — ABNORMAL HIGH (ref 65–99)
Potassium: 3.3 mmol/L — ABNORMAL LOW (ref 3.5–5.1)
Sodium: 135 mmol/L (ref 135–145)
Total Bilirubin: 1.6 mg/dL — ABNORMAL HIGH (ref 0.3–1.2)
Total Protein: 5.7 g/dL — ABNORMAL LOW (ref 6.5–8.1)

## 2017-08-19 LAB — APTT: aPTT: 95 seconds — ABNORMAL HIGH (ref 24–36)

## 2017-08-19 LAB — TYPE AND SCREEN
ABO/RH(D): O POS
Antibody Screen: NEGATIVE
Unit division: 0
Unit division: 0
Unit division: 0
Unit division: 0
Unit division: 0

## 2017-08-19 LAB — PROTIME-INR
INR: 1.57
Prothrombin Time: 18.6 seconds — ABNORMAL HIGH (ref 11.4–15.2)

## 2017-08-19 LAB — CBC WITH DIFFERENTIAL/PLATELET
Abs Immature Granulocytes: 0.1 10*3/uL (ref 0.0–0.1)
Basophils Absolute: 0 10*3/uL (ref 0.0–0.1)
Basophils Relative: 0 %
Eosinophils Absolute: 0.3 10*3/uL (ref 0.0–0.7)
Eosinophils Relative: 3 %
HCT: 27 % — ABNORMAL LOW (ref 39.0–52.0)
Hemoglobin: 8.6 g/dL — ABNORMAL LOW (ref 13.0–17.0)
Immature Granulocytes: 1 %
Lymphocytes Relative: 7 %
Lymphs Abs: 0.7 10*3/uL (ref 0.7–4.0)
MCH: 27.2 pg (ref 26.0–34.0)
MCHC: 31.9 g/dL (ref 30.0–36.0)
MCV: 85.4 fL (ref 78.0–100.0)
Monocytes Absolute: 0.9 10*3/uL (ref 0.1–1.0)
Monocytes Relative: 9 %
Neutro Abs: 8 10*3/uL — ABNORMAL HIGH (ref 1.7–7.7)
Neutrophils Relative %: 80 %
Platelets: 115 10*3/uL — ABNORMAL LOW (ref 150–400)
RBC: 3.16 MIL/uL — ABNORMAL LOW (ref 4.22–5.81)
RDW: 15.4 % (ref 11.5–15.5)
WBC: 10 10*3/uL (ref 4.0–10.5)

## 2017-08-19 LAB — VANCOMYCIN, TROUGH: Vancomycin Tr: 12 ug/mL — ABNORMAL LOW (ref 15–20)

## 2017-08-19 LAB — COOXEMETRY PANEL
Carboxyhemoglobin: 1.5 % (ref 0.5–1.5)
Carboxyhemoglobin: 1.4 % (ref 0.5–1.5)
Methemoglobin: 1.7 % — ABNORMAL HIGH (ref 0.0–1.5)
Methemoglobin: 1.7 % — ABNORMAL HIGH (ref 0.0–1.5)
O2 Saturation: 64.4 %
O2 Saturation: 63.6 %
Total hemoglobin: 8.6 g/dL — ABNORMAL LOW (ref 12.0–16.0)
Total hemoglobin: 10.1 g/dL — ABNORMAL LOW (ref 12.0–16.0)

## 2017-08-19 LAB — LACTATE DEHYDROGENASE: LDH: 245 U/L — ABNORMAL HIGH (ref 98–192)

## 2017-08-19 LAB — ECHO INTRAOPERATIVE TEE
HEIGHTINCHES: 70.984 in
WEIGHTICAEL: 4285.74 [oz_av]

## 2017-08-19 LAB — HEPARIN LEVEL (UNFRACTIONATED): Heparin Unfractionated: 0.18 IU/mL — ABNORMAL LOW (ref 0.30–0.70)

## 2017-08-19 LAB — MAGNESIUM: Magnesium: 1.5 mg/dL — ABNORMAL LOW (ref 1.7–2.4)

## 2017-08-19 LAB — PHOSPHORUS: Phosphorus: 2.9 mg/dL (ref 2.5–4.6)

## 2017-08-19 SURGERY — CLOSURE, STERNUM
Anesthesia: General | Site: Chest

## 2017-08-19 MED ORDER — ROCURONIUM BROMIDE 10 MG/ML (PF) SYRINGE
PREFILLED_SYRINGE | INTRAVENOUS | Status: DC | PRN
  Administered 2017-08-19 (×2): 100 mg via INTRAVENOUS

## 2017-08-19 MED ORDER — LACTATED RINGERS IV SOLN
INTRAVENOUS | Status: DC | PRN
  Administered 2017-08-19: 15:00:00 via INTRAVENOUS

## 2017-08-19 MED ORDER — NOREPINEPHRINE 16 MG/250ML-% IV SOLN
0.0000 ug/min | INTRAVENOUS | Status: DC
  Administered 2017-08-19: 2 ug/min via INTRAVENOUS
  Administered 2017-08-24: 4 ug/min via INTRAVENOUS
  Filled 2017-08-19 (×3): qty 250

## 2017-08-19 MED ORDER — ALBUMIN HUMAN 5 % IV SOLN
INTRAVENOUS | Status: DC | PRN
  Administered 2017-08-19 (×2): via INTRAVENOUS

## 2017-08-19 MED ORDER — VANCOMYCIN HCL 1000 MG IV SOLR
INTRAVENOUS | Status: DC | PRN
  Administered 2017-08-19: 1000 mL

## 2017-08-19 MED ORDER — HEPARIN (PORCINE) IN NACL 100-0.45 UNIT/ML-% IJ SOLN
1400.0000 [IU]/h | INTRAMUSCULAR | Status: DC
  Administered 2017-08-20: 900 [IU]/h via INTRAVENOUS
  Administered 2017-08-21 (×2): 1200 [IU]/h via INTRAVENOUS
  Administered 2017-08-22: 1350 [IU]/h via INTRAVENOUS
  Filled 2017-08-19 (×4): qty 250

## 2017-08-19 MED ORDER — INSULIN DETEMIR 100 UNIT/ML ~~LOC~~ SOLN
10.0000 [IU] | Freq: Two times a day (BID) | SUBCUTANEOUS | Status: DC
  Administered 2017-08-19 – 2017-08-20 (×4): 10 [IU] via SUBCUTANEOUS
  Filled 2017-08-19 (×5): qty 0.1

## 2017-08-19 MED ORDER — POTASSIUM CHLORIDE 10 MEQ/50ML IV SOLN
10.0000 meq | INTRAVENOUS | Status: AC
  Administered 2017-08-19 (×2): 10 meq via INTRAVENOUS

## 2017-08-19 MED ORDER — VANCOMYCIN HCL 1000 MG IV SOLR
INTRAVENOUS | Status: DC | PRN
  Administered 2017-08-19: 500 mg via INTRAVENOUS

## 2017-08-19 MED ORDER — VANCOMYCIN HCL 1000 MG IV SOLR
INTRAVENOUS | Status: AC
  Filled 2017-08-19: qty 1000

## 2017-08-19 MED ORDER — FENTANYL CITRATE (PF) 250 MCG/5ML IJ SOLN
INTRAMUSCULAR | Status: AC
  Filled 2017-08-19: qty 5

## 2017-08-19 MED ORDER — NITROGLYCERIN IN D5W 200-5 MCG/ML-% IV SOLN
INTRAVENOUS | Status: DC | PRN
  Administered 2017-08-19: 16.67 ug/min via INTRAVENOUS

## 2017-08-19 MED ORDER — PROPOFOL 10 MG/ML IV BOLUS
INTRAVENOUS | Status: AC
  Filled 2017-08-19: qty 20

## 2017-08-19 MED ORDER — DEXMEDETOMIDINE HCL IN NACL 200 MCG/50ML IV SOLN
INTRAVENOUS | Status: AC
  Filled 2017-08-19: qty 50

## 2017-08-19 MED ORDER — FENTANYL CITRATE (PF) 250 MCG/5ML IJ SOLN
INTRAMUSCULAR | Status: DC | PRN
  Administered 2017-08-19: 100 ug via INTRAVENOUS
  Administered 2017-08-19: 50 ug via INTRAVENOUS
  Administered 2017-08-19: 100 ug via INTRAVENOUS

## 2017-08-19 MED ORDER — VANCOMYCIN HCL IN DEXTROSE 1-5 GM/200ML-% IV SOLN
1000.0000 mg | Freq: Two times a day (BID) | INTRAVENOUS | Status: AC
  Administered 2017-08-19 – 2017-08-20 (×4): 1000 mg via INTRAVENOUS
  Filled 2017-08-19 (×4): qty 200

## 2017-08-19 MED ORDER — POTASSIUM CHLORIDE 10 MEQ/50ML IV SOLN
10.0000 meq | INTRAVENOUS | Status: AC | PRN
  Administered 2017-08-20 (×3): 10 meq via INTRAVENOUS
  Filled 2017-08-19 (×3): qty 50

## 2017-08-19 MED ORDER — MIDAZOLAM HCL 2 MG/2ML IJ SOLN
INTRAMUSCULAR | Status: AC
Start: 2017-08-19 — End: ?
  Filled 2017-08-19: qty 2

## 2017-08-19 MED ORDER — POTASSIUM CHLORIDE 10 MEQ/50ML IV SOLN
10.0000 meq | INTRAVENOUS | Status: AC | PRN
  Administered 2017-08-19 (×3): 10 meq via INTRAVENOUS
  Filled 2017-08-19 (×3): qty 50

## 2017-08-19 MED ORDER — MAGNESIUM SULFATE 2 GM/50ML IV SOLN
2.0000 g | Freq: Once | INTRAVENOUS | Status: AC
  Administered 2017-08-19: 2 g via INTRAVENOUS
  Filled 2017-08-19: qty 50

## 2017-08-19 MED ORDER — MIDAZOLAM HCL 2 MG/2ML IJ SOLN
INTRAMUSCULAR | Status: DC | PRN
  Administered 2017-08-19: 2 mg via INTRAVENOUS

## 2017-08-19 MED ORDER — SODIUM CHLORIDE 0.9 % IV SOLN
INTRAVENOUS | Status: DC
  Administered 2017-08-19 – 2017-09-06 (×12): via INTRAVENOUS

## 2017-08-19 MED ORDER — POTASSIUM CHLORIDE 10 MEQ/50ML IV SOLN
10.0000 meq | INTRAVENOUS | Status: AC
  Administered 2017-08-19 (×3): 10 meq via INTRAVENOUS
  Filled 2017-08-19 (×3): qty 50

## 2017-08-19 MED ORDER — NITROGLYCERIN IN D5W 200-5 MCG/ML-% IV SOLN
0.0000 ug/min | INTRAVENOUS | Status: DC
  Administered 2017-08-21: 5 ug/min via INTRAVENOUS

## 2017-08-19 MED ORDER — FUROSEMIDE 10 MG/ML IJ SOLN
8.0000 mg/h | INTRAVENOUS | Status: DC
  Administered 2017-08-19 – 2017-08-20 (×2): 6 mg/h via INTRAVENOUS
  Filled 2017-08-19 (×2): qty 25

## 2017-08-19 MED ORDER — FUROSEMIDE 10 MG/ML IJ SOLN
40.0000 mg | Freq: Once | INTRAMUSCULAR | Status: AC
  Administered 2017-08-19: 40 mg via INTRAVENOUS
  Filled 2017-08-19: qty 4

## 2017-08-19 MED ORDER — SODIUM CHLORIDE 0.9 % IR SOLN
Status: DC | PRN
  Administered 2017-08-19: 3000 mL

## 2017-08-19 SURGICAL SUPPLY — 61 items
APL SKNCLS STERI-STRIP NONHPOA (GAUZE/BANDAGES/DRESSINGS)
BAG DECANTER FOR FLEXI CONT (MISCELLANEOUS) ×4 IMPLANT
BENZOIN TINCTURE PRP APPL 2/3 (GAUZE/BANDAGES/DRESSINGS) IMPLANT
BLADE SAW SAG 29X58X.64 (BLADE) IMPLANT
CANISTER SUCT 3000ML PPV (MISCELLANEOUS) ×4 IMPLANT
CATH THORACIC 28FR RT ANG (CATHETERS) IMPLANT
CATH THORACIC 36FR (CATHETERS) IMPLANT
CATH THORACIC 36FR RT ANG (CATHETERS) IMPLANT
CONN Y 3/8X3/8X3/8  BEN (MISCELLANEOUS) ×6
CONN Y 3/8X3/8X3/8 BEN (MISCELLANEOUS) ×2 IMPLANT
CONT SPEC 4OZ CLIKSEAL STRL BL (MISCELLANEOUS) IMPLANT
DRAPE INCISE IOBAN 66X45 STRL (DRAPES) ×2 IMPLANT
DRAPE LAPAROSCOPIC ABDOMINAL (DRAPES) ×4 IMPLANT
DRAPE SLUSH/WARMER DISC (DRAPES) IMPLANT
DRSG AQUACEL AG ADV 3.5X14 (GAUZE/BANDAGES/DRESSINGS) ×4 IMPLANT
DRSG PAD ABDOMINAL 8X10 ST (GAUZE/BANDAGES/DRESSINGS) IMPLANT
ELECT REM PT RETURN 9FT ADLT (ELECTROSURGICAL) ×4
ELECTRODE REM PT RTRN 9FT ADLT (ELECTROSURGICAL) ×2 IMPLANT
GAUZE SPONGE 4X4 12PLY STRL (GAUZE/BANDAGES/DRESSINGS) ×4 IMPLANT
GAUZE XEROFORM 5X9 LF (GAUZE/BANDAGES/DRESSINGS) IMPLANT
GLOVE BIO SURGEON STRL SZ7 (GLOVE) ×2 IMPLANT
GLOVE BIO SURGEON STRL SZ7.5 (GLOVE) ×8 IMPLANT
GLOVE BIOGEL PI IND STRL 7.0 (GLOVE) IMPLANT
GLOVE BIOGEL PI INDICATOR 7.0 (GLOVE) ×2
GOWN STRL REUS W/ TWL LRG LVL3 (GOWN DISPOSABLE) ×8 IMPLANT
GOWN STRL REUS W/TWL LRG LVL3 (GOWN DISPOSABLE) ×16
HEMOSTAT POWDER SURGIFOAM 1G (HEMOSTASIS) IMPLANT
HEMOSTAT SURGICEL 2X14 (HEMOSTASIS) IMPLANT
KIT BASIN OR (CUSTOM PROCEDURE TRAY) ×4 IMPLANT
KIT SUCTION CATH 14FR (SUCTIONS) ×2 IMPLANT
KIT TURNOVER KIT B (KITS) ×4 IMPLANT
NS IRRIG 1000ML POUR BTL (IV SOLUTION) ×4 IMPLANT
PACK CHEST (CUSTOM PROCEDURE TRAY) ×4 IMPLANT
PAD ARMBOARD 7.5X6 YLW CONV (MISCELLANEOUS) ×8 IMPLANT
PIN SAFETY STERILE (MISCELLANEOUS) IMPLANT
RUBBERBAND STERILE (MISCELLANEOUS) IMPLANT
SPONGE LAP 18X18 X RAY DECT (DISPOSABLE) ×4 IMPLANT
STAPLER VISISTAT 35W (STAPLE) IMPLANT
STRAP MONTGOMERY 1.25X11-1/8 (MISCELLANEOUS) IMPLANT
SUT BONE WAX W31G (SUTURE) ×2 IMPLANT
SUT ETHILON 3 0 FSL (SUTURE) IMPLANT
SUT SILK  1 MH (SUTURE)
SUT SILK 1 MH (SUTURE) IMPLANT
SUT SILK 2 0SH CR/8 30 (SUTURE) ×2 IMPLANT
SUT STEEL 6MS V (SUTURE) ×2 IMPLANT
SUT STEEL STERNAL CCS#1 18IN (SUTURE) IMPLANT
SUT STEEL SZ 6 DBL 3X14 BALL (SUTURE) ×4 IMPLANT
SUT STERNA BAND STERNOTOMY (SUTURE) IMPLANT
SUT VIC AB 1 CTX 18 (SUTURE) ×8 IMPLANT
SUT VIC AB 1 CTX 36 (SUTURE)
SUT VIC AB 1 CTX36XBRD ANBCTR (SUTURE) IMPLANT
SUT VIC AB 2-0 CTX 27 (SUTURE) IMPLANT
SUT VIC AB 2-0 CTX 36 (SUTURE) ×4 IMPLANT
SUT VIC AB 3-0 X1 27 (SUTURE) ×2 IMPLANT
SWAB COLLECTION DEVICE MRSA (MISCELLANEOUS) IMPLANT
SWAB CULTURE ESWAB REG 1ML (MISCELLANEOUS) IMPLANT
SYSTEM SAHARA CHEST DRAIN ATS (WOUND CARE) ×4 IMPLANT
TAPE CLOTH SURG 6X10 WHT LF (GAUZE/BANDAGES/DRESSINGS) ×2 IMPLANT
TOWEL GREEN STERILE (TOWEL DISPOSABLE) ×4 IMPLANT
TOWEL GREEN STERILE FF (TOWEL DISPOSABLE) ×4 IMPLANT
WATER STERILE IRR 1000ML POUR (IV SOLUTION) ×4 IMPLANT

## 2017-08-19 NOTE — Transfer of Care (Signed)
Immediate Anesthesia Transfer of Care Note  Patient: [name] [name]  Procedure(s) Performed: STERNAL CLOSURE (N/A Chest) TRANSESOPHAGEAL ECHOCARDIOGRAM (TEE) (N/A )  Patient Location: ICU  Anesthesia Type:General  Level of Consciousness: Patient remains intubated per anesthesia plan  Airway & Oxygen Therapy: Patient remains intubated per anesthesia plan and Patient placed on Ventilator (see vital sign flow sheet for setting)  Post-op Assessment: Report given to RN and Post -op Vital signs reviewed and stable  Post vital signs: Reviewed and stable  Last Vitals:  Vitals Value Taken Time  BP    Temp    Pulse 88 08/19/2017  5:54 PM  Resp    SpO2 100 % 08/19/2017  5:54 PM  Vitals shown include unvalidated device data.  Last Pain:  Vitals:   08/19/17 1200  TempSrc: (P) Core  PainSc:          Complications: No apparent anesthesia complications

## 2017-08-19 NOTE — Progress Notes (Signed)
OT Cancellation Note  Patient Details Name: [name] [name] MRN: [id number] DOB: August 21, 1950   Cancelled Treatment:    Reason Eval/Treat Not Completed: Patient not medically ready.  Pt remains intubated   [facility], OTR/L [phone]   Lucille Passy M 08/19/2017, 7:30 AM

## 2017-08-19 NOTE — Progress Notes (Signed)
PT Cancellation Note  Patient Details Name: [name] [name] MRN: [id number] DOB: 1950-08-22   Cancelled Treatment:    Reason Eval/Treat Not Completed: Patient not medically ready(pt remains intubated)   Mabeline Varas B Ajahni Nay 08/19/2017, 7:17 AM  Elwyn Reach, [place]

## 2017-08-19 NOTE — Anesthesia Postprocedure Evaluation (Signed)
Anesthesia Post Note  Patient: [name] [name]  Procedure(s) Performed: STERNAL CLOSURE (N/A Chest) TRANSESOPHAGEAL ECHOCARDIOGRAM (TEE) (N/A )     Patient location during evaluation: SICU Anesthesia Type: General Level of consciousness: sedated and patient remains intubated per anesthesia plan Pain management: pain level controlled Vital Signs Assessment: post-procedure vital signs reviewed and stable Respiratory status: patient remains intubated per anesthesia plan and patient on ventilator - see flowsheet for VS Cardiovascular status: stable and blood pressure returned to baseline Postop Assessment: no apparent nausea or vomiting Anesthetic complications: no    Last Vitals:  Vitals:   08/19/17 1500 08/19/17 1755  BP:    Pulse:    Resp:  16  Temp:    SpO2: 100%     Last Pain:  Vitals:   08/19/17 1200  TempSrc: (P) Core  PainSc:                  Tavonte Seybold COKER

## 2017-08-19 NOTE — Progress Notes (Signed)
Patient ID: [name] [name], male   DOB: Jan 03, 1951, 67 y.o.   MRN: [id number]  TCTS Evening Rounds:   Hemodynamically stable after sternal closure today CI = 1.9 on Milrinone 0.5, epi 2, NO LVAD parameters stable, Co-ox 64 this pm.   Sedated on vent.   Urine output good on lasix 6. Repleteing K+ CT output low  CBC    Component Value Date/Time   WBC 10.0 08/19/2017 0443   RBC 3.16 (L) 08/19/2017 0443   HGB 9.5 (L) 08/19/2017 2005   HCT 28.0 (L) 08/19/2017 2005   PLT 115 (L) 08/19/2017 0443   MCV 85.4 08/19/2017 0443   MCH 27.2 08/19/2017 0443   MCHC 31.9 08/19/2017 0443   RDW 15.4 08/19/2017 0443   LYMPHSABS 0.7 08/19/2017 0443   MONOABS 0.9 08/19/2017 0443   EOSABS 0.3 08/19/2017 0443   BASOSABS 0.0 08/19/2017 0443     BMET    Component Value Date/Time   NA 151 (H) 08/19/2017 2005   K 2.9 (L) 08/19/2017 2005   CL 98 (L) 08/19/2017 2005   CO2 24 08/19/2017 0443   GLUCOSE 141 (H) 08/19/2017 2005   BUN 4 (L) 08/19/2017 2005   CREATININE 0.60 (L) 08/19/2017 2005   CREATININE 1.16 06/02/2016 1207   CALCIUM 8.4 (L) 08/19/2017 0443   GFRNONAA >60 08/19/2017 0443   GFRNONAA 65 06/02/2016 1207   GFRAA >60 08/19/2017 0443   GFRAA 75 06/02/2016 1207     A/P:  Stable postop course. Continue current plans

## 2017-08-19 NOTE — Progress Notes (Signed)
  Echocardiogram 2D Echocardiogram has been performed.  [name] [name] 08/19/2017, 4:35 PM

## 2017-08-19 NOTE — Progress Notes (Signed)
CSW met with patient's wife in the waiting room. Patient returned to the OR for chest closure. Wife states she is feeling better about patient's condition and very appreciative of staff for the support. Wife appears to be coping well under stressful conditions. CSW will continue to follow for supportive intervention throughout implant hospitalization. Raquel Sarna, [place], [place]

## 2017-08-19 NOTE — Progress Notes (Signed)
Wound found on pt's right buttock.  Top layer of skin gone, has the appearance of popped blister, blanches well.  Dressing applied.  Pt condition has improved to the point of being able to turn more fully now, instead of the small turns that have been done with the pt's sternum still open.    Karsten Ro, RN

## 2017-08-19 NOTE — Progress Notes (Signed)
Patient arrived to OR intubated and sedated. Patient identified with armband and patient consent with Fenton Foy, RN. Allergies confirmed via EMR. Patient arrived with foley, LVAD and chest tubes. Patient moved over to bed with maximum assistance.  Leatha Gilding, RN

## 2017-08-19 NOTE — Progress Notes (Signed)
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  During the night after surgeryThe patient developed  further signs of right ventricular dysfunction and was taken emergently back for mediastinal reexploration.  A clot was found compressing the right atrium from oozing from the pericardium where adhesions had been taken down at the time of surgery.  The patient returned to the operating room on June 20 for delayed sternal closure.  Patient has been started on IV heparin since postop day 1.  He remains intubated on inhaled nitric oxide to optimize RV function.   LVAD INTERROGATION:  HeartMate II LVAD:  Flow 4 liters/min, speed 5200, power 3.8, PI 2.8.  Controller intact.   Objective:    Vital Signs:   Temp:  [98.2 F (36.8 C)-99.1 F (37.3 C)] 98.4 F (36.9 C) (06/20 1423) Pulse Rate:  [25-90] 89 (06/20 1200) Resp:  [15-17] 16 (06/20 1755) BP: (84-112)/(56-100) 92/78 (06/20 1400) SpO2:  [95 %-100 %] 100 % (06/20 1500) Arterial Line BP: (78-95)/(53-75) 83/55 (06/20 1300) FiO2 (%):  [50 %] 50 % (06/20 1755) Weight:  [267 lb 13.7 oz (121.5 kg)] 267 lb 13.7 oz (121.5 kg) (06/20 0459) Last BM Date: 08/16/17 Mean arterial Pressure 75-80 mmHg  Intake/Output:   Intake/Output Summary (Last 24 hours) at 08/19/2017 1827 Last data filed at 08/19/2017 1755 Gross per 24 hour  Intake 4286.41 ml  Output 5860 ml  Net -1573.59 ml     Physical Exam: General:  Well appearing.  Intubated and sedated HEENT: normal Neck: supple. JVP . Carotids 2+ bilat; no bruits. No lymphadenopathy or thryomegaly appreciated. Cor: Mechanical heart  sounds with LVAD hum present. Lungs: clear Abdomen: soft, nontender, nondistended. No hepatosplenomegaly. No bruits or masses. Good bowel sounds. Extremities: no cyanosis, clubbing, rash, edema Neuro: alert & orientedx3, cranial nerves grossly intact. moves all 4 extremities w/o difficulty. Affect pleasant  Telemetry: V paced at 90/min for underlying A. fib  Labs: Basic Metabolic Panel: Recent Labs  Lab 08/16/17 1556 08/16/17 2100  08/17/17 0314  08/17/17 0940  08/17/17 1649 08/17/17 1653 08/18/17 0302 08/18/17 1710 08/19/17 0443 08/19/17 0453  NA 137  --    < > 134*   < > 136   < >  --  137 134* 136 135 136  K 3.3*  --    < > 4.4   < > 4.0   < >  --  4.3 4.0 3.5 3.3* 3.3*  CL 107  --    < > 106   < > 109   < >  --  103 106 101 104 98*  CO2 24  --   --  20*  --  22  --   --   --  21*  --  24  --   GLUCOSE 131*  --    < > 134*   < > 115*   < >  --  103* 122* 129* 160* 165*  BUN 10  --    < > 9   < > 9   < >  --  9 7 5* 7 6  CREATININE 0.84 0.88   < > 0.90   < > 0.91   < >  0.89 0.70 0.88 0.80 0.89 0.70  CALCIUM 8.7*  --   --  8.9  --  8.8*  --   --   --  8.6*  --  8.4*  --   MG 1.5* 2.3  --  1.9  --   --   --  1.7  --  1.6*  --  1.5*  --   PHOS  --   --   --  3.5  --   --   --   --   --  2.5  --  2.9  --    < > = values in this interval not displayed.    Liver Function Tests: Recent Labs  Lab 08/17/17 0314 08/18/17 0302 08/19/17 0443  AST 68* 73* 48*  ALT 19 16* 16*  ALKPHOS 45 78 49  BILITOT 2.7* 1.8* 1.6*  PROT 5.9* 5.5* 5.7*  ALBUMIN 3.3* 3.0* 2.6*   No results for input(s): LIPASE, AMYLASE in the last 168 hours. No results for input(s): AMMONIA in the last 168 hours.  CBC: Recent Labs  Lab 08/17/17 0314  08/17/17 0940  08/17/17 1649 08/17/17 1653 08/18/17 0302 08/18/17 1710 08/19/17 0443 08/19/17 0453  WBC 9.0  --  7.2  --  8.6  --  9.4  --  10.0  --   NEUTROABS 7.3  --   --   --   --   --  7.4  --  8.0*  --   HGB 10.0*   < > 8.5*   < > 8.4* 8.8* 8.6*  9.2* 8.6* 9.2*  HCT 31.2*   < > 26.7*   < > 26.4* 26.0* 27.0* 27.0* 27.0* 27.0*  MCV 85.5  --  85.0  --  85.7  --  86.0  --  85.4  --   PLT 119*  --  88*  --  92*  --  96*  --  115*  --    < > = values in this interval not displayed.    INR: Recent Labs  Lab 08/16/17 1733 08/17/17 0314 08/17/17 0940 08/18/17 0302 08/19/17 0443  INR 1.73 1.71 1.95 1.92 1.57    Other results:  EKG:   Imaging: Dg Chest Port 1 View  Result Date: 08/19/2017 CLINICAL DATA:  Left ventricular assist device EXAM: PORTABLE CHEST 1 VIEW COMPARISON:  08/19/2017 FINDINGS: Endotracheal tube in good position. Swan-Ganz catheter in the main pulmonary artery. NG tube in place. Left chest tube in place. No pneumothorax Pacemaker/AICD unchanged in position. Left ventricular assist device unchanged in position Improvement in bilateral airspace disease most likely clearing edema. There is mild residual atelectasis in the bases IMPRESSION: Improving pulmonary edema.  No pneumothorax. Electronically Signed   By: Franchot Gallo M.D.   On: 08/19/2017 18:18   Dg Chest Port 1 View  Result Date: 08/19/2017 CLINICAL DATA:  LVAD device EXAM: PORTABLE CHEST 1 VIEW COMPARISON:  08/18/2017 FINDINGS: Endotracheal and NG tubes are stable. Right jugular central venous catheter is stable. Right jugular introducer and Swan-Ganz catheter with its tip in the central right pulmonary artery is stable. Left subclavian AICD device with leads is stable. The LVAD device is stable at the cardiac apex. Tricuspid valve replacement hardware remains in place. Lungs are under aerated. Central basilar hazy opacity has increased and is nonspecific representing edema versus volume loss. No pneumothorax. IMPRESSION: Increasing bibasilar haziness likely a combination of edema and volume loss. Support apparatus is stable. Electronically Signed   By: Marybelle Killings  M.D.   On: 08/19/2017 08:41   Dg Chest Port 1 View  Result Date: 08/18/2017 CLINICAL DATA:   Hypoxia EXAM: PORTABLE CHEST 1 VIEW COMPARISON:  August 17, 2017 FINDINGS: Endotracheal tube tip is 4.2 cm above the carina. Central catheter tip is at the cavoatrial junction. There is a Swan-Ganz catheter with tip in the right main pulmonary artery. Pacemaker leads are attached to the right atrium, right ventricle, and coronary sinus. There is a chest tube on the left. Note that there is a left ventricular assist device present. There is a prosthetic tricuspid valve. No pneumothorax. There is patchy atelectasis in the left base with small left pleural effusion. Lungs elsewhere are clear. Heart is enlarged with pulmonary vascularity normal. No adenopathy. No bone lesions. IMPRESSION: Left ventricular assist device present. Prosthetic tricuspid valve present. Pacemaker leads attached to right atrium, right ventricle, and coronary sinus. Tube and catheter positions as described. No pneumothorax. There is atelectatic change left base with small left pleural effusion. Lungs elsewhere clear. Stable cardiac prominence. Electronically Signed   By: Lowella Grip III M.D.   On: 08/18/2017 08:40      Medications:     Scheduled Medications: . acetaminophen  1,000 mg Oral Q6H   Or  . acetaminophen (TYLENOL) oral liquid 160 mg/5 mL  1,000 mg Per Tube Q6H  . aspirin EC  325 mg Oral Daily   Or  . aspirin  324 mg Per Tube Daily   Or  . aspirin  300 mg Rectal Daily  . bisacodyl  10 mg Oral Daily   Or  . bisacodyl  10 mg Rectal Daily  . chlorhexidine gluconate (MEDLINE KIT)  15 mL Mouth Rinse BID  . Chlorhexidine Gluconate Cloth  6 each Topical Daily  . insulin aspart  0-24 Units Subcutaneous Q4H  . insulin detemir  10 Units Subcutaneous BID  . levalbuterol  0.63 mg Nebulization Q6H  . mouth rinse  15 mL Mouth Rinse 10 times per day  . metoCLOPramide (REGLAN) injection  10 mg Intravenous Q6H  . rifampin  600 mg Oral Once  . sodium chloride flush  10-40 mL Intracatheter Q12H  . vancomycin 1000 mg in NS  (1000 ml) irrigation for Dr. Roxy Manns case   Irrigation To OR     Infusions: . sodium chloride 10 mL/hr at 08/19/17 1520  . sodium chloride    . sodium chloride 10 mL/hr at 08/18/17 1345  . dexmedetomidine (PRECEDEX) IV infusion 1.2 mcg/kg/hr (08/19/17 1520)  . EPINEPHrine 4 mg in dextrose 5% 250 mL infusion (16 mcg/mL) 2 mcg/min (08/19/17 1703)  . famotidine (PEPCID) IV Stopped (08/19/17 0957)  . fentaNYL infusion INTRAVENOUS 275 mcg/hr (08/19/17 1520)  . [START ON 08/20/2017] heparin    . lactated ringers    . lactated ringers Stopped (08/18/17 0933)  . lactated ringers 10 mL/hr at 08/19/17 1520  . lactated ringers 10 mL/hr at 08/19/17 1520  . levofloxacin (LEVAQUIN) IV Stopped (08/19/17 4098)  . milrinone 0.5 mcg/kg/min (08/19/17 1520)  . norepinephrine (LEVOPHED) Adult infusion Stopped (08/19/17 1635)  . 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 Start Coumadin tomorrow with heparin bridge now that the sternum is been closed.  I reviewed the LVAD parameters from today, and compared the results to the patient's prior recorded data.  No programming changes were made.  The LVAD is functioning within specified parameters.  The patient performs LVAD self-test daily.  LVAD interrogation was negative for any significant power changes, alarms or PI events/speed drops.  LVAD equipment check  completed and is in good working order.  Back-up equipment present.   LVAD education done on emergency procedures and precautions and reviewed exit site care.  Length of Stay: [place] III 08/19/2017, 6:27 PM

## 2017-08-19 NOTE — Progress Notes (Signed)
2 Days Post-Op Procedure(s): MEDIASTINAL REXPLORATION with evacuation of hematoma Subjective:  Stable with improved hemodynamics and good VAD flows Appears ready for delayed sternal closure later today CXR wet on lasix drip, cvp < 10  Objective: Vital signs in last 24 hours: Temp:  [98.2 F (36.8 C)-99.5 F (37.5 C)] 99.1 F (37.3 C) (06/20 0841) Pulse Rate:  [25-90] 51 (06/20 0841) Cardiac Rhythm: Ventricular paced (06/20 0800) Resp:  [14-20] 16 (06/20 0841) BP: (79-112)/(56-100) 85/74 (06/20 0800) SpO2:  [95 %-100 %] 95 % (06/20 0841) Arterial Line BP: (78-92)/(53-71) 85/60 (06/20 0800) FiO2 (%):  [50 %] 50 % (06/20 0841) Weight:  [267 lb 13.7 oz (121.5 kg)] 267 lb 13.7 oz (121.5 kg) (06/20 0459)  Hemodynamic parameters for last 24 hours: PAP: (28-41)/(13-22) 33/19 CVP:  [7 mmHg-20 mmHg] 10 mmHg CO:  [4.4 L/min-5.2 L/min] 4.4 L/min CI:  [1.9 L/min/m2-2.3 L/min/m2] 1.9 L/min/m2  Intake/Output from previous day: 06/19 0701 - 06/20 0700 In: 5288.5 [I.V.:2777.8; NG/GT:100; IV Piggyback:2410.7] Out: 2229 [Urine:3655; Emesis/NG output:350; Chest Tube:560] Intake/Output this shift: Total I/O In: 174.9 [I.V.:125.3; IV Piggyback:49.6] Out: 230 [Urine:200; Chest Tube:30]  Sedated on vent Extremities warm  Lab Results: Recent Labs    08/18/17 0302  08/19/17 0443 08/19/17 0453  WBC 9.4  --  10.0  --   HGB 8.6*   < > 8.6* 9.2*  HCT 27.0*   < > 27.0* 27.0*  PLT 96*  --  115*  --    < > = values in this interval not displayed.   BMET:  Recent Labs    08/18/17 0302  08/19/17 0443 08/19/17 0453  NA 134*   < > 135 136  K 4.0   < > 3.3* 3.3*  CL 106   < > 104 98*  CO2 21*  --  24  --   GLUCOSE 122*   < > 160* 165*  BUN 7   < > 7 6  CREATININE 0.88   < > 0.89 0.70  CALCIUM 8.6*  --  8.4*  --    < > = values in this interval not displayed.    PT/INR:  Recent Labs    08/19/17 0443  LABPROT 18.6*  INR 1.57   ABG    Component Value Date/Time   PHART 7.457 (H)  08/18/2017 0406   HCO3 24.3 08/18/2017 0406   TCO2 22 08/19/2017 0453   ACIDBASEDEF 5.0 (H) 08/17/2017 2032   O2SAT 64.4 08/19/2017 0455   CBG (last 3)  Recent Labs    08/18/17 1944 08/18/17 2049 08/18/17 2318  GLUCAP 138* 152* 169*    Assessment/Plan: S/P Procedure(s): MEDIASTINAL REXPLORATION with evacuation of hematoma Delayed sternal closure today- stop heparin for procedure   LOS: 8 days    Tharon Aquas Trigt III 08/19/2017

## 2017-08-19 NOTE — Op Note (Signed)
NAME: [name] [name], [place] MEDICAL RECORD XB:[id number] ACCOUNT [id number] DATE OF BIRTH:1950/03/07 FACILITY: [facility] LOCATION: [facility] PHYSICIAN:Asael Pann VAN TRIGT III, MD  OPERATIVE REPORT  DATE OF PROCEDURE:  08/19/2017  OPERATION:  Delayed sternal closure.  PREOPERATIVE DIAGNOSIS:  Open sternum following implantation of HeartMate 3 left ventricular assist device in a patient with right ventricular dysfunction.  POSTOPERATIVE DIAGNOSIS:  Open sternum following implantation of HeartMate 3 left ventricular assist device in a patient with right ventricular dysfunction.  ANESTHESIA:  General.  SURGEON:  Ivin Poot, MD.  DESCRIPTION OF PROCEDURE:  The patient was brought directly from the ICU after informed consent from the family had been obtained and the procedure discussed in full.  The patient had a preoperative RV dysfunction prior to implantation of HeartMate 3 and  we were unable to close the sternum due to borderline RV function.  The patient was brought back to the operating room for sternal closure after having 36 hours of diuresis and improved hemodynamics indicating improving RV function.  Informed consent  from the family was obtained.  PROCEDURE:  The patient was brought directly into the operating room from the ICU, placed supine on the operating table.  General anesthesia was induced.  The patient had already been intubated on the ventilator and had invasive monitoring lines.  A  transesophageal echo probe was placed by the anesthesia team.  The patient's chest was prepped and draped and the chest tubes were prepped into the sterile field and covered.  A proper time-out was performed.  The previous sternal incision was opened.  The incision was closed only over the subcutaneous tissue and skin.  After the sutures were removed.  The mediastinum was examined.  There is minimal blood or clot.  It looked  fairly clean.  The LVAD apparatus was intact.  The wound was irrigated  with vancomycin irrigation.  The chest tubes were suctioned out and were patent.  They are left in place.  We placed the sternal wires.  We pulled the sternal wires together and the patient remained hemodynamically stable.  There is some under filling of the left ventricle noted and adjustments in HeartMate 3 pump speed were made.  The wires were then twisted and the patient remained  stable.  Mixed venous saturation was 70%.  The pectoralis fascial layer was closed with interrupted #1 Vicryls after irrigation.  The subcutaneous layer was closed with a running 2-0 Vicryl after irrigation.  The skin was closed with a subcuticular  Vicryl layer after irrigation.  A sterile dressing was applied.  The patient was then returned to the ICU in stable condition.  TN/NUANCE  D:08/19/2017 T:08/19/2017 JOB:[id number]

## 2017-08-19 NOTE — Progress Notes (Addendum)
Advanced Heart Failure Rounding Note  PCP-Cardiologist: No primary care provider on file.   Subjective:    Events 6/17 Underwent HM-3 implant -> Unable to close chest with RV failure and high intrathoracic pressure 6/18 Taken back to OR for evacuation of hematoma. NO RVAD needed. Left sternum open   Drips Milrinone 0.5  Epi 1.5 NE 2  Vent: NO 31 ppm, FiO2 50%, PEEP 5  Weight down 7 lbs. CVP 10-11. On 8 mg/hr lasix.   Remains intubated and sedated   Swan CVP 13-14 PA 32/16 CVP 10-11 CO 4.56 CI1.99  LVAD Interrogation HM 3: Speed: 5300 Flow: 4.3 PI: 2.2 Power: 4.0. 0-5 PI events. 1 low flow overnight   Objective:   Weight Range: 267 lb 13.7 oz (121.5 kg) Body mass index is 37.38 kg/m.   Vital Signs:   Temp:  [98.2 F (36.8 C)-99.5 F (37.5 C)] 99 F (37.2 C) (06/20 0700) Pulse Rate:  [25-90] 90 (06/20 0700) Resp:  [14-20] 16 (06/20 0700) BP: (79-112)/(56-100) 112/100 (06/20 0600) SpO2:  [97 %-100 %] 100 % (06/20 0700) Arterial Line BP: (78-92)/(53-71) 78/53 (06/20 0700) FiO2 (%):  [50 %] 50 % (06/20 0327) Weight:  [267 lb 13.7 oz (121.5 kg)] 267 lb 13.7 oz (121.5 kg) (06/20 0459) Last BM Date: 08/16/17  Weight change: Filed Weights   08/16/17 0430 08/18/17 0430 08/19/17 0459  Weight: 242 lb 11.6 oz (110.1 kg) 274 lb 0.5 oz (124.3 kg) 267 lb 13.7 oz (121.5 kg)    Intake/Output:   Intake/Output Summary (Last 24 hours) at 08/19/2017 0802 Last data filed at 08/19/2017 0700 Gross per 24 hour  Intake 5085.19 ml  Output 4500 ml  Net 585.19 ml      Physical Exam    General: Intubated/Sedated.  HEENT: +ETT Neck: Supple, JVP elevated cm. Carotids OK.  Cardiac:  Mechanical heart sounds with LVAD hum present. Dressing C/D/I Lungs:  + Mechanical breathing sounds. + Coarse. + CTs.  Abdomen:  NT, ND, no HSM. No bruits or masses. +BS  LVAD exit site: Dressing dry and intact.  Extremities:  Warm and dry. No cyanosis, clubbing, or rash. 1-2+  edema. Neuro:  Intubated/Sedated.  Telemetry   V paced 90s, personally reviewed.   Labs    CBC Recent Labs    08/18/17 0302  08/19/17 0443 08/19/17 0453  WBC 9.4  --  10.0  --   NEUTROABS 7.4  --  8.0*  --   HGB 8.6*   < > 8.6* 9.2*  HCT 27.0*   < > 27.0* 27.0*  MCV 86.0  --  85.4  --   PLT 96*  --  115*  --    < > = values in this interval not displayed.   Basic Metabolic Panel Recent Labs    08/18/17 0302  08/19/17 0443 08/19/17 0453  NA 134*   < > 135 136  K 4.0   < > 3.3* 3.3*  CL 106   < > 104 98*  CO2 21*  --  24  --   GLUCOSE 122*   < > 160* 165*  BUN 7   < > 7 6  CREATININE 0.88   < > 0.89 0.70  CALCIUM 8.6*  --  8.4*  --   MG 1.6*  --  1.5*  --   PHOS 2.5  --  2.9  --    < > = values in this interval not displayed.   Liver Function Tests Recent Labs  08/18/17 0302 08/19/17 0443  AST 73* 48*  ALT 16* 16*  ALKPHOS 78 49  BILITOT 1.8* 1.6*  PROT 5.5* 5.7*  ALBUMIN 3.0* 2.6*   No results for input(s): LIPASE, AMYLASE in the last 72 hours. Cardiac Enzymes No results for input(s): CKTOTAL, CKMB, CKMBINDEX, TROPONINI in the last 72 hours.  BNP: BNP (last 3 results) Recent Labs    01/15/17 0911 08/11/17 1438 08/17/17 0314  BNP 159.6* 529.3* 266.4*    ProBNP (last 3 results) Recent Labs    11/12/16 1559  PROBNP 388.0*     D-Dimer Recent Labs    08/16/17 1733  DDIMER 2.61*   Hemoglobin A1C No results for input(s): HGBA1C in the last 72 hours. Fasting Lipid Panel No results for input(s): CHOL, HDL, LDLCALC, TRIG, CHOLHDL, LDLDIRECT in the last 72 hours. Thyroid Function Tests No results for input(s): TSH, T4TOTAL, T3FREE, THYROIDAB in the last 72 hours.  Invalid input(s): FREET3  Other results:   Imaging    Dg Chest Port 1 View  Result Date: 08/18/2017 CLINICAL DATA:  Hypoxia EXAM: PORTABLE CHEST 1 VIEW COMPARISON:  August 17, 2017 FINDINGS: Endotracheal tube tip is 4.2 cm above the carina. Central catheter tip is at the  cavoatrial junction. There is a Swan-Ganz catheter with tip in the right main pulmonary artery. Pacemaker leads are attached to the right atrium, right ventricle, and coronary sinus. There is a chest tube on the left. Note that there is a left ventricular assist device present. There is a prosthetic tricuspid valve. No pneumothorax. There is patchy atelectasis in the left base with small left pleural effusion. Lungs elsewhere are clear. Heart is enlarged with pulmonary vascularity normal. No adenopathy. No bone lesions. IMPRESSION: Left ventricular assist device present. Prosthetic tricuspid valve present. Pacemaker leads attached to right atrium, right ventricle, and coronary sinus. Tube and catheter positions as described. No pneumothorax. There is atelectatic change left base with small left pleural effusion. Lungs elsewhere clear. Stable cardiac prominence. Electronically Signed   By: Lowella Grip III M.D.   On: 08/18/2017 08:40     Medications:     Scheduled Medications: . acetaminophen  1,000 mg Oral Q6H   Or  . acetaminophen (TYLENOL) oral liquid 160 mg/5 mL  1,000 mg Per Tube Q6H  . aspirin EC  325 mg Oral Daily   Or  . aspirin  324 mg Per Tube Daily   Or  . aspirin  300 mg Rectal Daily  . bisacodyl  10 mg Oral Daily   Or  . bisacodyl  10 mg Rectal Daily  . chlorhexidine gluconate (MEDLINE KIT)  15 mL Mouth Rinse BID  . Chlorhexidine Gluconate Cloth  6 each Topical Daily  . insulin aspart  0-24 Units Subcutaneous Q4H  . insulin detemir  14 Units Subcutaneous BID  . levalbuterol  0.63 mg Nebulization Q6H  . mouth rinse  15 mL Mouth Rinse 10 times per day  . metoCLOPramide (REGLAN) injection  10 mg Intravenous Q6H  . rifampin  600 mg Oral Once  . sodium chloride flush  10-40 mL Intracatheter Q12H    Infusions: . sodium chloride 10 mL/hr at 08/19/17 0700  . sodium chloride    . sodium chloride 10 mL/hr at 08/18/17 1345  . dexmedetomidine (PRECEDEX) IV infusion 1.2  mcg/kg/hr (08/19/17 0700)  . EPINEPHrine 4 mg in dextrose 5% 250 mL infusion (16 mcg/mL) 1.5 mcg/min (08/19/17 0700)  . famotidine (PEPCID) IV Stopped (08/18/17 2258)  . fentaNYL infusion INTRAVENOUS 250  mcg/hr (08/19/17 0700)  . furosemide (LASIX) infusion 8 mg/hr (08/19/17 0700)  . heparin 900 Units/hr (08/19/17 0700)  . insulin (NOVOLIN-R) infusion Stopped (08/18/17 1509)  . lactated ringers    . lactated ringers Stopped (08/18/17 0933)  . lactated ringers 10 mL/hr at 08/19/17 0700  . lactated ringers Stopped (08/19/17 0641)  . levofloxacin (LEVAQUIN) IV Stopped (08/19/17 7829)  . milrinone 0.5 mcg/kg/min (08/19/17 0748)  . norepinephrine (LEVOPHED) Adult infusion 2 mcg/min (08/19/17 0700)  . potassium chloride 10 mEq (08/19/17 0745)  . vancomycin Stopped (08/18/17 2155)    PRN Medications: sodium chloride, fentaNYL, lactated ringers, lactated ringers, midazolam, morphine injection, ondansetron (ZOFRAN) IV, oxyCODONE, sodium chloride flush    Patient Profile  KEM HENSEN is a 67 y.o. 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   Directly admitted with persistent low cardiac output for milrinone initiation for home.  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 Acute/Chronic systolic CHF with biventricular failure-> cardiogenic shock : - Echo  08/13/2017 EF 20-25%. s/p  Medtronic BiV ICD in place.  - Cath 12/18 with stable 1v CAD.  - s/p HM-3 implant 6/17. Unable to close chest due to high-intrathoracic pressures and RV failure - Taken back to OR 08/17/17 with evacuation of hematoma with improvement - Coox 64.4% on Milrinone 0.5 mcg/kg/min, Epi 1.5, and NE 2.  - CVP 10-11 - Continue lasix 8 mg/hr. Give bolus of 40 mg now to optimized for sternal closure, possibly today.  - VAD interrogated personally. Parameters stable, 1 low flow.   2. CAD  s/p PCI of RCA and LAD  - recent cath with stable CAD as above - No change to current plan.    3. [name] - Recent A1c 8.3 on 6/3.  - Cover with SSI in house.   4. CKD II-III - Creatinine baseline 1.2-1.4. Creatinine 0.70 today - Follow closely.   5. [name] - s/p ICD. Quiescent. - Keep K>= 4.0 Mg >= 2.0. Supp Mg  6. PAF s/p ablation 04/2012 - Device interrogation showed A fib since May 16th.  - Remains in Afib.  Length of Stay: [place], [place]  08/19/2017, 8:02 AM  Advanced Heart Failure Team Pager [phone] (M-F; 7a - 4p)  Please contact [facility] for night-coverage after hours (4p -7a ) and weekends on [email or web address]  Agree.   Remains intubated and sedated. On NO 30. Remains on low dose NE and EPI. Diuresing well on EPI gtt. MAPs in 61s. Swan numbers improving. VAD parameters stable. Minimal CT drainage.  General:  Intubated sedated HEENT: ETT Neck: supple. RIJ swan. JVP to jaw  Cor: Dressing intact. +CTsLVAD hum.  Lungs: Clear anteriorly Abdomen: obese soft, nontender, + mildly distended. Hypoactive BS  Driveline site clean. Anchor in place.  Extremities: no cyanosis, clubbing, rash.2+edema  Neuro: intubated sedated  Continues to make progress. Diuresing well. Will give 1 dose 40 IV lasix to facilitate diurese in anticipation of returning to OR today for chest closure. Will wait until after OR to wean inotropes and NO. VAD interrogated personally. Parameters stable.  CRITICAL CARE Performed by: Glori Bickers  Total critical care time: 35 minutes  Critical care time was exclusive of separately billable procedures and treating other patients.  Critical care was necessary to treat or prevent imminent or life-threatening deterioration.  Critical care was time spent personally by me (independent of midlevel providers or residents) on the following activities: development of treatment plan with patient and/or surrogate as well as nursing, discussions  with consultants, evaluation of patient's response to treatment, examination of patient, obtaining history from patient or surrogate, ordering and performing treatments and interventions, ordering and review of laboratory studies, ordering and review of radiographic studies, pulse oximetry and re-evaluation of patient's condition.  Glori Bickers, MD  6:40 PM

## 2017-08-19 NOTE — Progress Notes (Signed)
Pharmacy Antibiotic Note  [name] [name] is a 67 y.o. male admitted on 08/11/2017 with surgical prophylaxis.    Underwent LVAD placement on 6/17. Currently on day #4 of antibiotics - vancomycin planned to continue through 6/21. Vancomycin trough obtained 10.5 hours after last dose came back at 12. WBC WNL. Afebrile. Scr prior to surgery 1.2 (normCrCl 60 mL/min) - post surgery 0.7 (normCrCl>100 mL/min).  Plan: Will increase vancomycin to 1000 mg IV every 12 hours to complete course for targeted goal trough 15-20 mcg/mL.  Monitor renal fx, clinical pic, and VT prn  Height: 5' 10.98" (180.3 cm) Weight: 267 lb 13.7 oz (121.5 kg) IBW/kg (Calculated) : 75.26  Temp (24hrs), Avg:99 F (37.2 C), Min:98.2 F (36.8 C), Max:99.5 F (37.5 C)  Recent Labs  Lab 08/17/17 0314  08/17/17 0940  08/17/17 1649 08/17/17 1653 08/18/17 0302 08/18/17 1710 08/19/17 0443 08/19/17 0453 08/19/17 0734  WBC 9.0  --  7.2  --  8.6  --  9.4  --  10.0  --   --   CREATININE 0.90   < > 0.91   < > 0.89 0.70 0.88 0.80 0.89 0.70  --   VANCOTROUGH  --   --   --   --   --   --   --   --   --   --  12*   < > = values in this interval not displayed.    Estimated Creatinine Clearance: 118.9 mL/min (by C-G formula based on SCr of 0.7 mg/dL).    Allergies  Allergen Reactions  . Penicillins Hives, Itching and Other (See Comments)    Has patient had a PCN reaction causing immediate rash, facial/tongue/throat swelling, SOB or lightheadedness with hypotension:# # Yes # # Has patient had a PCN reaction causing severe rash involving mucus membranes or skin necrosis: No Has patient had a PCN reaction that required hospitalization: No Has patient had a PCN reaction occurring within the last 10 years: No If all of the above answers are "NO", then may proceed with Cephalosporin use.    Microbiology results: 6/17 AICD Lead Vegetation Cx: no orgs seen 6/16 MRSA PCR: negative  Thank you for allowing pharmacy to be a part  of this patient's care.  Doylene Canard, PharmD Clinical Pharmacist  Pager: [phone] Phone: [phone] 08/19/2017 8:48 AM

## 2017-08-19 NOTE — Brief Op Note (Signed)
08/19/2017  5:26 PM  PATIENT:  [name] [name]  67 y.o. male  PRE-OPERATIVE DIAGNOSIS:  OPEN CHEST S/P VAD  POST-OPERATIVE DIAGNOSIS:  OPEN STERNUM (POST OP VAD)  PROCEDURE:  Procedure(s): STERNAL CLOSURE (N/A) TRANSESOPHAGEAL ECHOCARDIOGRAM (TEE) (N/A)  SURGEON:  Surgeon(s) and Role:    Ivin Poot, MD - Primary  PHYSICIAN ASSISTANT:   ASSISTANTS: none   ANESTHESIA:   general  EBL: 30 cc BLOOD ADMINISTERED:none  DRAINS: none   LOCAL MEDICATIONS USED:  NONE  SPECIMEN:  No Specimen  DISPOSITION OF SPECIMEN:  N/A  COUNTS:  YES  TOURNIQUET:  * No tourniquets in log *  DICTATION: .Dragon Dictation  PLAN OF CARE: return to 2 H 25  PATIENT DISPOSITION:  ICU - intubated and hemodynamically stable.   Delay start of Pharmacological VTE agent (>24hrs) due to surgical blood loss or risk of bleeding: yes

## 2017-08-19 NOTE — Anesthesia Preprocedure Evaluation (Signed)
Anesthesia Evaluation  Patient identified by MRN, date of birth, ID band Patient unresponsive    Reviewed: Allergy & Precautions, NPO status , Patient's Chart, lab work & pertinent test results, Unable to perform ROS - Chart review only  Airway Mallampati: Intubated       Dental   Pulmonary former smoker,     + decreased breath sounds      Cardiovascular hypertension,  Rhythm:Irregular Rate:Normal     Neuro/Psych    GI/Hepatic   Endo/Other  diabetes  Renal/GU      Musculoskeletal   Abdominal   Peds  Hematology   Anesthesia Other Findings   Reproductive/Obstetrics                             Anesthesia Physical Anesthesia Plan  ASA: III  Anesthesia Plan: General   Post-op Pain Management:    Induction: Intravenous  PONV Risk Score and Plan:   Airway Management Planned: Oral ETT  Additional Equipment: Arterial line, CVP, PA Cath and 3D TEE  Intra-op Plan:   Post-operative Plan: Post-operative intubation/ventilation  Informed Consent: I have reviewed the patients History and Physical, chart, labs and discussed the procedure including the risks, benefits and alternatives for the proposed anesthesia with the patient or authorized representative who has indicated his/her understanding and acceptance.     Plan Discussed with: CRNA and Anesthesiologist  Anesthesia Plan Comments:         Anesthesia Quick Evaluation

## 2017-08-20 ENCOUNTER — Encounter (HOSPITAL_COMMUNITY): Payer: Self-pay | Admitting: Cardiothoracic Surgery

## 2017-08-20 ENCOUNTER — Inpatient Hospital Stay (HOSPITAL_COMMUNITY): Payer: PPO

## 2017-08-20 DIAGNOSIS — Z95811 Presence of heart assist device: Secondary | ICD-10-CM

## 2017-08-20 DIAGNOSIS — I5022 Chronic systolic (congestive) heart failure: Secondary | ICD-10-CM

## 2017-08-20 LAB — CBC WITH DIFFERENTIAL/PLATELET
Abs Immature Granulocytes: 0.1 10*3/uL (ref 0.0–0.1)
Basophils Absolute: 0 10*3/uL (ref 0.0–0.1)
Basophils Relative: 0 %
Eosinophils Absolute: 0.1 10*3/uL (ref 0.0–0.7)
Eosinophils Relative: 1 %
HCT: 26.4 % — ABNORMAL LOW (ref 39.0–52.0)
Hemoglobin: 8.3 g/dL — ABNORMAL LOW (ref 13.0–17.0)
Immature Granulocytes: 1 %
Lymphocytes Relative: 4 %
Lymphs Abs: 0.4 10*3/uL — ABNORMAL LOW (ref 0.7–4.0)
MCH: 26.8 pg (ref 26.0–34.0)
MCHC: 31.4 g/dL (ref 30.0–36.0)
MCV: 85.2 fL (ref 78.0–100.0)
Monocytes Absolute: 0.9 10*3/uL (ref 0.1–1.0)
Monocytes Relative: 10 %
Neutro Abs: 7.7 10*3/uL (ref 1.7–7.7)
Neutrophils Relative %: 84 %
Platelets: 134 10*3/uL — ABNORMAL LOW (ref 150–400)
RBC: 3.1 MIL/uL — ABNORMAL LOW (ref 4.22–5.81)
RDW: 15.4 % (ref 11.5–15.5)
WBC: 9.1 10*3/uL (ref 4.0–10.5)

## 2017-08-20 LAB — LACTATE DEHYDROGENASE: LDH: 240 U/L — ABNORMAL HIGH (ref 98–192)

## 2017-08-20 LAB — HEPARIN LEVEL (UNFRACTIONATED)
Heparin Unfractionated: 0.17 IU/mL — ABNORMAL LOW (ref 0.30–0.70)
Heparin Unfractionated: 0.19 IU/mL — ABNORMAL LOW (ref 0.30–0.70)

## 2017-08-20 LAB — BASIC METABOLIC PANEL
Anion gap: 6 (ref 5–15)
BUN: 8 mg/dL (ref 6–20)
CO2: 25 mmol/L (ref 22–32)
Calcium: 8.2 mg/dL — ABNORMAL LOW (ref 8.9–10.3)
Chloride: 102 mmol/L (ref 101–111)
Creatinine, Ser: 0.83 mg/dL (ref 0.61–1.24)
GFR calc Af Amer: >60 mL/min (ref >60)
GFR calc non Af Amer: >60 mL/min (ref >60)
Glucose, Bld: 217 mg/dL — ABNORMAL HIGH (ref 65–99)
Potassium: 3.7 mmol/L (ref 3.5–5.1)
Sodium: 133 mmol/L — ABNORMAL LOW (ref 135–145)

## 2017-08-20 LAB — COOXEMETRY PANEL
Carboxyhemoglobin: 2.1 % — ABNORMAL HIGH (ref 0.5–1.5)
Carboxyhemoglobin: 1.3 % (ref 0.5–1.5)
Methemoglobin: 1.1 % (ref 0.0–1.5)
Methemoglobin: 1.5 % (ref 0.0–1.5)
O2 Saturation: 72.7 %
O2 Saturation: 58.3 %
Total hemoglobin: 7.3 g/dL — ABNORMAL LOW (ref 12.0–16.0)
Total hemoglobin: 12 g/dL (ref 12.0–16.0)

## 2017-08-20 LAB — GLUCOSE, CAPILLARY
GLUCOSE-CAPILLARY: 151 mg/dL — AB (ref 65–99)
GLUCOSE-CAPILLARY: 199 mg/dL — AB (ref 65–99)
GLUCOSE-CAPILLARY: 218 mg/dL — AB (ref 65–99)
Glucose-Capillary: 192 mg/dL — ABNORMAL HIGH (ref 65–99)
Glucose-Capillary: 195 mg/dL — ABNORMAL HIGH (ref 65–99)
Glucose-Capillary: 158 mg/dL — ABNORMAL HIGH (ref 65–99)
Glucose-Capillary: 216 mg/dL — ABNORMAL HIGH (ref 65–99)

## 2017-08-20 LAB — COMPREHENSIVE METABOLIC PANEL
ALT: 16 U/L — ABNORMAL LOW (ref 17–63)
AST: 53 U/L — ABNORMAL HIGH (ref 15–41)
Albumin: 2.7 g/dL — ABNORMAL LOW (ref 3.5–5.0)
Alkaline Phosphatase: 46 U/L (ref 38–126)
Anion gap: 8 (ref 5–15)
BUN: 5 mg/dL — ABNORMAL LOW (ref 6–20)
CO2: 24 mmol/L (ref 22–32)
Calcium: 8.3 mg/dL — ABNORMAL LOW (ref 8.9–10.3)
Chloride: 102 mmol/L (ref 101–111)
Creatinine, Ser: 0.86 mg/dL (ref 0.61–1.24)
GFR calc Af Amer: >60 mL/min (ref >60)
GFR calc non Af Amer: >60 mL/min (ref >60)
Glucose, Bld: 197 mg/dL — ABNORMAL HIGH (ref 65–99)
Potassium: 3.9 mmol/L (ref 3.5–5.1)
Sodium: 134 mmol/L — ABNORMAL LOW (ref 135–145)
Total Bilirubin: 1.9 mg/dL — ABNORMAL HIGH (ref 0.3–1.2)
Total Protein: 5.7 g/dL — ABNORMAL LOW (ref 6.5–8.1)

## 2017-08-20 LAB — PROTIME-INR
INR: 1.37
Prothrombin Time: 16.8 seconds — ABNORMAL HIGH (ref 11.4–15.2)

## 2017-08-20 LAB — MAGNESIUM: Magnesium: 1.6 mg/dL — ABNORMAL LOW (ref 1.7–2.4)

## 2017-08-20 LAB — PHOSPHORUS: Phosphorus: 3 mg/dL (ref 2.5–4.6)

## 2017-08-20 MED ORDER — WARFARIN SODIUM 5 MG PO TABS
5.0000 mg | ORAL_TABLET | Freq: Once | ORAL | Status: AC
  Administered 2017-08-20: 5 mg via ORAL
  Filled 2017-08-20: qty 1

## 2017-08-20 MED ORDER — PRO-STAT SUGAR FREE PO LIQD
30.0000 mL | Freq: Two times a day (BID) | ORAL | Status: DC
  Administered 2017-08-20 – 2017-08-27 (×15): 30 mL
  Filled 2017-08-20 (×15): qty 30

## 2017-08-20 MED ORDER — POTASSIUM CHLORIDE 10 MEQ/50ML IV SOLN
10.0000 meq | INTRAVENOUS | Status: AC
  Administered 2017-08-20 (×3): 10 meq via INTRAVENOUS
  Filled 2017-08-20 (×2): qty 50

## 2017-08-20 MED ORDER — LEVALBUTEROL HCL 0.63 MG/3ML IN NEBU
0.6300 mg | INHALATION_SOLUTION | Freq: Three times a day (TID) | RESPIRATORY_TRACT | Status: DC
  Administered 2017-08-21 – 2017-09-02 (×39): 0.63 mg via RESPIRATORY_TRACT
  Filled 2017-08-20 (×39): qty 3

## 2017-08-20 MED ORDER — WARFARIN - PHYSICIAN DOSING INPATIENT: Freq: Every day | Status: DC

## 2017-08-20 MED ORDER — JEVITY 1.2 CAL PO LIQD: 1000.0000 mL | Freq: Three times a day (TID) | ORAL | Status: DC

## 2017-08-20 MED ORDER — MAGNESIUM SULFATE 2 GM/50ML IV SOLN
2.0000 g | Freq: Once | INTRAVENOUS | Status: AC
  Administered 2017-08-20: 2 g via INTRAVENOUS
  Filled 2017-08-20: qty 50

## 2017-08-20 MED ORDER — VITAL 1.5 CAL PO LIQD
1000.0000 mL | ORAL | Status: DC
  Filled 2017-08-20 (×4): qty 1000

## 2017-08-20 MED ORDER — WARFARIN - PHARMACIST DOSING INPATIENT
Freq: Every day | Status: DC
  Administered 2017-08-21 – 2017-09-09 (×14)

## 2017-08-20 MED ORDER — VITAL HIGH PROTEIN PO LIQD
1000.0000 mL | ORAL | Status: DC
  Administered 2017-08-20 – 2017-08-21 (×2): 1000 mL

## 2017-08-20 NOTE — Progress Notes (Addendum)
Advanced Heart Failure Rounding Note  PCP-Cardiologist: No primary care provider on file.   Subjective:    Events 6/17 Underwent HM-3 implant -> Unable to close chest with RV failure and high intrathoracic pressure 6/18 Taken back to OR for evacuation of hematoma. NO RVAD needed. Left sternum open  08/19/17 Taken back to OR for sternal closure  Drips Milrinone 0.5 Epi 2 NE Off  Vent: NO 31 ppm, FiO2 50%, PEEP 5  Weight down another 8 lbs. CVP 10-11. On 6 mg/hr lasix.   Remains intubated and sedated  Swan CVP 10-11 PA 35/13 CVP 9-10 CO 4.0 CI 1.7  LVAD Interrogation HM 3: Speed: 5200 Flow: 4.3 PI: 1.9 Power: 4.0. No PI events   Objective:   Weight Range: 259 lb 7.7 oz (117.7 kg) Body mass index is 36.21 kg/m.   Vital Signs:   Temp:  [96.8 F (36 C)-99.1 F (37.3 C)] 97.2 F (36.2 C) (06/21 0800) Pulse Rate:  [35-90] 35 (06/21 0730) Resp:  [14-20] 16 (06/21 0800) BP: (79-111)/(38-87) 81/72 (06/21 0800) SpO2:  [93 %-100 %] 100 % (06/21 0800) Arterial Line BP: (77-103)/(53-84) 77/53 (06/21 0800) FiO2 (%):  [50 %] 50 % (06/21 0400) Weight:  [259 lb 7.7 oz (117.7 kg)] 259 lb 7.7 oz (117.7 kg) (06/21 0500) Last BM Date: 08/16/17  Weight change: Filed Weights   08/18/17 0430 08/19/17 0459 08/20/17 0500  Weight: 274 lb 0.5 oz (124.3 kg) 267 lb 13.7 oz (121.5 kg) 259 lb 7.7 oz (117.7 kg)    Intake/Output:   Intake/Output Summary (Last 24 hours) at 08/20/2017 0834 Last data filed at 08/20/2017 0819 Gross per 24 hour  Intake 4072.86 ml  Output 4580 ml  Net -507.14 ml      Physical Exam    General: Intubated/Sedated.  HEENT: Normal. Neck: Supple, JVP 10-11 cm. Carotids OK.  Cardiac:  Mechanical heart sounds with LVAD hum present.  Lungs: + Mechanical breathing sounds. +  Course. +Cts.  Abdomen:  NT, ND, no HSM. No bruits or masses. +BS  LVAD exit site: Dressing dry and intact.  Extremities:  Warm and dry. No cyanosis, clubbing, or rash. 1-2+  edema. Neuro: Intubated/Sedated.   Telemetry   V paced 90s, personally reviewed.   Labs    CBC Recent Labs    08/19/17 0443  08/19/17 2005 08/20/17 0429  WBC 10.0  --   --  9.1  NEUTROABS 8.0*  --   --  7.7  HGB 8.6*   < > 9.5* 8.3*  HCT 27.0*   < > 28.0* 26.4*  MCV 85.4  --   --  85.2  PLT 115*  --   --  134*   < > = values in this interval not displayed.   Basic Metabolic Panel Recent Labs    08/19/17 0443  08/19/17 2005 08/20/17 0429  NA 135   < > 151* 134*  K 3.3*   < > 2.9* 3.9  CL 104   < > 98* 102  CO2 24  --   --  24  GLUCOSE 160*   < > 141* 197*  BUN 7   < > 4* 5*  CREATININE 0.89   < > 0.60* 0.86  CALCIUM 8.4*  --   --  8.3*  MG 1.5*  --   --  1.6*  PHOS 2.9  --   --  3.0   < > = values in this interval not displayed.   Liver Function Tests  Recent Labs    08/19/17 0443 08/20/17 0429  AST 48* 53*  ALT 16* 16*  ALKPHOS 49 46  BILITOT 1.6* 1.9*  PROT 5.7* 5.7*  ALBUMIN 2.6* 2.7*   No results for input(s): LIPASE, AMYLASE in the last 72 hours. Cardiac Enzymes No results for input(s): CKTOTAL, CKMB, CKMBINDEX, TROPONINI in the last 72 hours.  BNP: BNP (last 3 results) Recent Labs    01/15/17 0911 08/11/17 1438 08/17/17 0314  BNP 159.6* 529.3* 266.4*    ProBNP (last 3 results) Recent Labs    11/12/16 1559  PROBNP 388.0*     D-Dimer No results for input(s): DDIMER in the last 72 hours. Hemoglobin A1C No results for input(s): HGBA1C in the last 72 hours. Fasting Lipid Panel No results for input(s): CHOL, HDL, LDLCALC, TRIG, CHOLHDL, LDLDIRECT in the last 72 hours. Thyroid Function Tests No results for input(s): TSH, T4TOTAL, T3FREE, THYROIDAB in the last 72 hours.  Invalid input(s): FREET3  Other results:   Imaging    Dg Chest Port 1 View  Result Date: 08/20/2017 CLINICAL DATA:  LVAD EXAM: PORTABLE CHEST 1 VIEW COMPARISON:  08/19/2017 FINDINGS: Cardiac shadow remains enlarged. Defibrillator as well as left ventricular  assist device are again seen. Swan-Ganz catheter is noted in the pulmonary outflow tract. Endotracheal tube and nasogastric catheter are seen in satisfactory position. Right jugular central line is noted as well. Left thoracostomy catheter and mediastinal drain are seen. No pneumothorax is noted. Mild residual pulmonary edema is noted stable from the prior exam. No new focal infiltrate is seen. IMPRESSION: Overall stable appearance of the chest when compared with the previous day. Electronically Signed   By: Inez Catalina M.D.   On: 08/20/2017 07:24   Dg Chest Port 1 View  Result Date: 08/19/2017 CLINICAL DATA:  Left ventricular assist device EXAM: PORTABLE CHEST 1 VIEW COMPARISON:  08/19/2017 FINDINGS: Endotracheal tube in good position. Swan-Ganz catheter in the main pulmonary artery. NG tube in place. Left chest tube in place. No pneumothorax Pacemaker/AICD unchanged in position. Left ventricular assist device unchanged in position Improvement in bilateral airspace disease most likely clearing edema. There is mild residual atelectasis in the bases IMPRESSION: Improving pulmonary edema.  No pneumothorax. Electronically Signed   By: Franchot Gallo M.D.   On: 08/19/2017 18:18     Medications:     Scheduled Medications: . acetaminophen  1,000 mg Oral Q6H   Or  . acetaminophen (TYLENOL) oral liquid 160 mg/5 mL  1,000 mg Per Tube Q6H  . aspirin EC  325 mg Oral Daily   Or  . aspirin  324 mg Per Tube Daily   Or  . aspirin  300 mg Rectal Daily  . bisacodyl  10 mg Oral Daily   Or  . bisacodyl  10 mg Rectal Daily  . chlorhexidine gluconate (MEDLINE KIT)  15 mL Mouth Rinse BID  . Chlorhexidine Gluconate Cloth  6 each Topical Daily  . insulin aspart  0-24 Units Subcutaneous Q4H  . insulin detemir  10 Units Subcutaneous BID  . levalbuterol  0.63 mg Nebulization Q6H  . mouth rinse  15 mL Mouth Rinse 10 times per day  . metoCLOPramide (REGLAN) injection  10 mg Intravenous Q6H  . rifampin  600 mg Oral  Once  . sodium chloride flush  10-40 mL Intracatheter Q12H  . vancomycin 1000 mg in NS (1000 ml) irrigation for Dr. Roxy Manns case   Irrigation To OR    Infusions: . sodium chloride 10 mL/hr at  08/20/17 0700  . sodium chloride    . sodium chloride 10 mL/hr at 08/18/17 1345  . sodium chloride Stopped (08/20/17 2202)  . dexmedetomidine (PRECEDEX) IV infusion 1.2 mcg/kg/hr (08/20/17 0700)  . EPINEPHrine 4 mg in dextrose 5% 250 mL infusion (16 mcg/mL) 2 mcg/min (08/20/17 0819)  . famotidine (PEPCID) IV Stopped (08/19/17 2326)  . fentaNYL infusion INTRAVENOUS 275 mcg/hr (08/20/17 0700)  . furosemide (LASIX) infusion 6 mg/hr (08/20/17 0700)  . heparin 900 Units/hr (08/20/17 0700)  . lactated ringers    . lactated ringers Stopped (08/18/17 0933)  . lactated ringers 10 mL/hr at 08/20/17 0700  . lactated ringers Stopped (08/19/17 1602)  . levofloxacin (LEVAQUIN) IV 100 mL/hr at 08/20/17 0700  . milrinone 0.5 mcg/kg/min (08/20/17 0700)  . nitroGLYCERIN Stopped (08/19/17 2236)  . norepinephrine (LEVOPHED) Adult infusion Stopped (08/20/17 0513)  . vancomycin 200 mL/hr at 08/19/17 2200    PRN Medications: sodium chloride, fentaNYL, lactated ringers, lactated ringers, midazolam, morphine injection, ondansetron (ZOFRAN) IV, oxyCODONE, sodium chloride flush    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   Directly admitted with persistent low cardiac output for milrinone initiation for home.  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 Acute/Chronic systolic CHF with biventricular failure-> cardiogenic shock : - Echo  08/13/2017 EF 20-25%. s/p  Medtronic BiV ICD in place.  - Cath 12/18 with stable 1v CAD.  - s/p HM-3 implant 6/17. Unable to close chest due to high-intrathoracic pressures and RV failure - Taken back to  OR 08/17/17 with evacuation of hematoma with improvement - Coox 72.7% on milrinone 0.5 mcg/kg/min and Epi 2.  - CVP 10-11 - Continue lasix 6 mg/hr.  - VAD interrogated personally. Parameters stable.     2. [name]  - Recent cath with stable CAD as above - No change to current plan.    3. [name] - Recent A1c 8.3 on 6/3.  - Cover with SSI in house.   4. CKD II-III - Creatinine baseline 1.2-1.4. Creatinine 0.86 today.  - Follow closely.   5. [name] - s/p ICD. Quiescent.  - Keep K>= 4.0 Mg >= 2.0. Supp Mg  6. PAF s/p ablation 04/2012 - Device interrogation showed A fib since May 16th.  - Remains in Afib.   Length of Stay: [place], [place]  08/20/2017, 8:34 AM  Advanced Heart Failure Team Pager [phone] (M-F; 7a - 4p)  Please contact [facility] for night-coverage after hours (4p -7a ) and weekends on [email or web address]  Agree with above.  Underwent sternal closure yesterday. He remains intubated and sedated. On NO 30. EPI 2 and milrinone. NE off. Making good urine on lasix gtt now at 6/hr.  Weight down 8 pounds overnight. MAPs stable. VAD interrogated personally. Parameters stable.  General:  Intubated sedated HEENT: ETT Neck: RIJ swan  Cor: sternal dressing CT x 3  LVAD hum.  Lungs: Coarse  Abdomen: obese soft, nontender, + mildly distended.Driveline site clean. Anchor in place.  Extremities: no cyanosis, clubbing, rash. 1+ dema  Neuro: intubated sedated   He is s/p sternal closure. Hemodynamics improving . Diuresing well. Will wean NO today and hopefully can extubate tomorrow. Continue EPI and milrinone for now. Wean EPI as tolerated.  Continue diuresis. VAD interrogated personally. Parameters stable.  CRITICAL CARE Performed by: Glori Bickers  Total critical care time: 35 minutes  Critical care time was exclusive of separately billable procedures and treating other patients.  Critical care was necessary to treat or prevent imminent  or life-threatening deterioration.  Critical care was time spent personally by me (independent of midlevel providers or residents) on the following activities: development of treatment plan with patient and/or surrogate as well as nursing, discussions with consultants, evaluation of patient's response to treatment, examination of patient, obtaining history from patient or surrogate, ordering and performing treatments and interventions, ordering and review of laboratory studies, ordering and review of radiographic studies, pulse oximetry and re-evaluation of patient's condition.   Glori Bickers, MD  11:50 AM

## 2017-08-20 NOTE — Anesthesia Preprocedure Evaluation (Signed)
Anesthesia Evaluation  Patient identified by MRN, date of birth, ID band Patient awake and Patient unresponsive    Reviewed: Allergy & Precautions, NPO status , Patient's Chart, lab work & pertinent test results, Unable to perform ROS - Chart review only  History of Anesthesia Complications Negative for: history of anesthetic complications  Airway Mallampati: Intubated       Dental  (+) Edentulous Upper, Edentulous Lower   Pulmonary former smoker,    breath sounds clear to auscultation       Cardiovascular hypertension, + CAD, + Past MI, +CHF and + Orthopnea  + Cardiac Defibrillator  Rhythm:Irregular Rate:Normal     Neuro/Psych TIA Neuromuscular disease negative psych ROS   GI/Hepatic negative GI ROS, Neg liver ROS,   Endo/Other  diabetes  Renal/GU Renal disease     Musculoskeletal negative musculoskeletal ROS (+)   Abdominal   Peds  Hematology negative hematology ROS (+)   Anesthesia Other Findings   Reproductive/Obstetrics                             Anesthesia Physical Anesthesia Plan  ASA: IV  Anesthesia Plan: General   Post-op Pain Management:    Induction: Inhalational  PONV Risk Score and Plan: 2  Airway Management Planned: Oral ETT  Additional Equipment: Arterial line, PA Cath, TEE and CVP  Intra-op Plan:   Post-operative Plan: Post-operative intubation/ventilation  Informed Consent:   Plan Discussed with: CRNA and Surgeon  Anesthesia Plan Comments: (TEE for monitoring only)        Anesthesia Quick Evaluation

## 2017-08-20 NOTE — Progress Notes (Addendum)
HeartMate 3  postop day #4 rounding Note  Subjective:   HeartMate 3 implantation with combined tricuspid valve annuloplasty repair June 17 for mixed ischemic and nonischemic cardiomyopathy, cardiac amyloid disease and preoperative moderate to severe RV dysfunction, preoperative moderate to severe tricuspid regurgitation  Patient had significant RV dysfunction and sternum was left open to maintain adequate hemodynamics.  During the night after surgeryThe patient developed  further signs of right ventricular dysfunction and was taken emergently back for mediastinal reexploration.  A clot was found compressing the right atrium from oozing from the pericardium where adhesions had been taken down at the time of surgery.  The patient returned to the operating room on June 20 for delayed sternal closure.  Patient has been started on IV heparin since postop day 1.  He remains intubated on inhaled nitric oxide to optimize RV function.  We are weaning nitric oxide and following parameters of RV function.  So far he has tolerated wean to 20 ppm   LVAD INTERROGATION:  HeartMate II LVAD:  Flow 4 liters/min, speed 5200, power 3.8, PI 2.8.  Controller intact.   Objective:    Vital Signs:   Temp:  [96.8 F (36 C)-98.4 F (36.9 C)] 98.4 F (36.9 C) (06/21 1500) Pulse Rate:  [31-90] 88 (06/21 1500) Resp:  [14-22] 16 (06/21 1500) BP: (73-111)/(38-87) 73/51 (06/21 1512) SpO2:  [93 %-100 %] 99 % (06/21 1500) Arterial Line BP: (73-103)/(48-84) 90/67 (06/21 1500) FiO2 (%):  [50 %] 50 % (06/21 1513) Weight:  [259 lb 7.7 oz (117.7 kg)] 259 lb 7.7 oz (117.7 kg) (06/21 0500) Last BM Date: 08/16/17 Mean arterial Pressure 75-80 mmHg  Intake/Output:   Intake/Output Summary (Last 24 hours) at 08/20/2017 1517 Last data filed at 08/20/2017 1500 Gross per 24 hour  Intake 4355.45 ml  Output 3305 ml  Net 1050.45 ml     Physical Exam: General:  Well appearing.  Intubated and sedated HEENT: normal Neck: supple.  JVP . Carotids 2+ bilat; no bruits. No lymphadenopathy or thryomegaly appreciated. Cor: Mechanical heart sounds with LVAD hum present. Lungs: clear Abdomen: soft, nontender, nondistended. No hepatosplenomegaly. No bruits or masses. Good bowel sounds. Extremities: no cyanosis, clubbing, rash, edema Neuro: alert & orientedx3, cranial nerves grossly intact. moves all 4 extremities w/o difficulty. Affect pleasant  Telemetry: V paced at 90/min for underlying A. fib  Labs: Basic Metabolic Panel: Recent Labs  Lab 08/17/17 0314  08/17/17 0940  08/17/17 1649  08/18/17 0302 08/18/17 1710 08/19/17 0443 08/19/17 0453 08/19/17 1646 08/19/17 2005 08/20/17 0429  NA 134*   < > 136   < >  --    < > 134* 136 135 136 135 151* 134*  K 4.4   < > 4.0   < >  --    < > 4.0 3.5 3.3* 3.3* 3.3* 2.9* 3.9  CL 106   < > 109   < >  --    < > 106 101 104 98*  --  98* 102  CO2 20*  --  22  --   --   --  21*  --  24  --   --   --  24  GLUCOSE 134*   < > 115*   < >  --    < > 122* 129* 160* 165*  --  141* 197*  BUN 9   < > 9   < >  --    < > 7 5* 7 6  --  4* 5*  CREATININE 0.90   < > 0.91   < > 0.89   < > 0.88 0.80 0.89 0.70  --  0.60* 0.86  CALCIUM 8.9  --  8.8*  --   --   --  8.6*  --  8.4*  --   --   --  8.3*  MG 1.9  --   --   --  1.7  --  1.6*  --  1.5*  --   --   --  1.6*  PHOS 3.5  --   --   --   --   --  2.5  --  2.9  --   --   --  3.0   < > = values in this interval not displayed.    Liver Function Tests: Recent Labs  Lab 08/17/17 0314 08/18/17 0302 08/19/17 0443 08/20/17 0429  AST 68* 73* 48* 53*  ALT 19 16* 16* 16*  ALKPHOS 45 78 49 46  BILITOT 2.7* 1.8* 1.6* 1.9*  PROT 5.9* 5.5* 5.7* 5.7*  ALBUMIN 3.3* 3.0* 2.6* 2.7*   No results for input(s): LIPASE, AMYLASE in the last 168 hours. No results for input(s): AMMONIA in the last 168 hours.  CBC: Recent Labs  Lab 08/17/17 0314  08/17/17 0940  08/17/17 1649  08/18/17 0302  08/19/17 0443 08/19/17 0453 08/19/17 1646 08/19/17 2005  08/20/17 0429  WBC 9.0  --  7.2  --  8.6  --  9.4  --  10.0  --   --   --  9.1  NEUTROABS 7.3  --   --   --   --   --  7.4  --  8.0*  --   --   --  7.7  HGB 10.0*   < > 8.5*   < > 8.4*   < > 8.6*   < > 8.6* 9.2* 9.5* 9.5* 8.3*  HCT 31.2*   < > 26.7*   < > 26.4*   < > 27.0*   < > 27.0* 27.0* 28.0* 28.0* 26.4*  MCV 85.5  --  85.0  --  85.7  --  86.0  --  85.4  --   --   --  85.2  PLT 119*  --  88*  --  92*  --  96*  --  115*  --   --   --  134*   < > = values in this interval not displayed.    INR: Recent Labs  Lab 08/17/17 0314 08/17/17 0940 08/18/17 0302 08/19/17 0443 08/20/17 0429  INR 1.71 1.95 1.92 1.57 1.37    Other results:  EKG:   Imaging: Dg Chest Port 1 View  Result Date: 08/20/2017 CLINICAL DATA:  LVAD EXAM: PORTABLE CHEST 1 VIEW COMPARISON:  08/19/2017 FINDINGS: Cardiac shadow remains enlarged. Defibrillator as well as left ventricular assist device are again seen. Swan-Ganz catheter is noted in the pulmonary outflow tract. Endotracheal tube and nasogastric catheter are seen in satisfactory position. Right jugular central line is noted as well. Left thoracostomy catheter and mediastinal drain are seen. No pneumothorax is noted. Mild residual pulmonary edema is noted stable from the prior exam. No new focal infiltrate is seen. IMPRESSION: Overall stable appearance of the chest when compared with the previous day. Electronically Signed   By: Inez Catalina M.D.   On: 08/20/2017 07:24   Dg Chest Port 1 View  Result Date: 08/19/2017 CLINICAL DATA:  Left ventricular assist device EXAM: PORTABLE  CHEST 1 VIEW COMPARISON:  08/19/2017 FINDINGS: Endotracheal tube in good position. Swan-Ganz catheter in the main pulmonary artery. NG tube in place. Left chest tube in place. No pneumothorax Pacemaker/AICD unchanged in position. Left ventricular assist device unchanged in position Improvement in bilateral airspace disease most likely clearing edema. There is mild residual atelectasis in  the bases IMPRESSION: Improving pulmonary edema.  No pneumothorax. Electronically Signed   By: Franchot Gallo M.D.   On: 08/19/2017 18:18   Dg Chest Port 1 View  Result Date: 08/19/2017 CLINICAL DATA:  LVAD device EXAM: PORTABLE CHEST 1 VIEW COMPARISON:  08/18/2017 FINDINGS: Endotracheal and NG tubes are stable. Right jugular central venous catheter is stable. Right jugular introducer and Swan-Ganz catheter with its tip in the central right pulmonary artery is stable. Left subclavian AICD device with leads is stable. The LVAD device is stable at the cardiac apex. Tricuspid valve replacement hardware remains in place. Lungs are under aerated. Central basilar hazy opacity has increased and is nonspecific representing edema versus volume loss. No pneumothorax. IMPRESSION: Increasing bibasilar haziness likely a combination of edema and volume loss. Support apparatus is stable. Electronically Signed   By: Marybelle Killings M.D.   On: 08/19/2017 08:41   Dg Abd Portable 1v  Result Date: 08/20/2017 CLINICAL DATA:  Feeding tube placement EXAM: PORTABLE ABDOMEN - 1 VIEW COMPARISON:  None. FINDINGS: Feeding tube tip is at the level of the first portion of the duodenum. There is no bowel dilatation or air-fluid level to suggest bowel obstruction. No free air. Left ventricular assist device present. IMPRESSION: Feeding tube tip at first portion of duodenum. No bowel obstruction or free air evident. Electronically Signed   By: Lowella Grip III M.D.   On: 08/20/2017 13:03     Medications:     Scheduled Medications: . acetaminophen  1,000 mg Oral Q6H   Or  . acetaminophen (TYLENOL) oral liquid 160 mg/5 mL  1,000 mg Per Tube Q6H  . aspirin EC  325 mg Oral Daily   Or  . aspirin  324 mg Per Tube Daily   Or  . aspirin  300 mg Rectal Daily  . bisacodyl  10 mg Oral Daily   Or  . bisacodyl  10 mg Rectal Daily  . chlorhexidine gluconate (MEDLINE KIT)  15 mL Mouth Rinse BID  . Chlorhexidine Gluconate Cloth  6  each Topical Daily  . feeding supplement (PRO-STAT SUGAR FREE 64)  30 mL Per Tube BID  . insulin aspart  0-24 Units Subcutaneous Q4H  . insulin detemir  10 Units Subcutaneous BID  . levalbuterol  0.63 mg Nebulization Q6H  . mouth rinse  15 mL Mouth Rinse 10 times per day  . metoCLOPramide (REGLAN) injection  10 mg Intravenous Q6H  . rifampin  600 mg Oral Once  . sodium chloride flush  10-40 mL Intracatheter Q12H    Infusions: . sodium chloride Stopped (08/20/17 1401)  . sodium chloride    . sodium chloride 10 mL/hr at 08/18/17 1345  . sodium chloride 10 mL/hr at 08/20/17 1500  . dexmedetomidine (PRECEDEX) IV infusion 1.2 mcg/kg/hr (08/20/17 1500)  . EPINEPHrine 4 mg in dextrose 5% 250 mL infusion (16 mcg/mL) 2 mcg/min (08/20/17 1500)  . famotidine (PEPCID) IV Stopped (08/20/17 0949)  . feeding supplement (VITAL HIGH PROTEIN) 1,000 mL (08/20/17 1347)  . fentaNYL infusion INTRAVENOUS 275 mcg/hr (08/20/17 1500)  . furosemide (LASIX) infusion 6 mg/hr (08/20/17 1500)  . heparin 1,050 Units/hr (08/20/17 1500)  . lactated ringers    .  lactated ringers Stopped (08/18/17 0933)  . lactated ringers 10 mL/hr at 08/20/17 1500  . lactated ringers Stopped (08/19/17 1602)  . levofloxacin (LEVAQUIN) IV Stopped (08/20/17 0749)  . milrinone 0.5 mcg/kg/min (08/20/17 1500)  . nitroGLYCERIN Stopped (08/19/17 2236)  . norepinephrine (LEVOPHED) Adult infusion Stopped (08/20/17 0513)  . 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  Coumadin dose per pharmacy  Cor tract feeding tube placed for nutritional support I reviewed the LVAD parameters from today, and compared the results to the patient's prior recorded data.  No programming changes were made.  The LVAD is functioning within specified parameters.  The patient performs LVAD self-test daily.  LVAD interrogation was negative for any significant power changes, alarms or PI events/speed drops.  LVAD equipment check completed and is in good working order.  Back-up equipment present.   LVAD education done on emergency procedures and precautions and reviewed exit site care.  Length of Stay: Isabel III 08/20/2017, 3:17 PM

## 2017-08-20 NOTE — Progress Notes (Signed)
Per MD Prescott Gum, stop weaning NO at 5 ppm and leave NO at 5 ppm throughout the night.  Informed Respiratory Therapy.

## 2017-08-20 NOTE — Progress Notes (Signed)
PT Cancellation Note  Patient Details Name: [name] [name] MRN: [id number] DOB: [birth year]   Cancelled Treatment:    Reason Eval/Treat Not Completed: Patient not medically ready(pt remains intubated)   Quill Grinder B Brianah Hopson 08/20/2017, 7:09 AM  Elwyn Reach, [place]

## 2017-08-20 NOTE — Progress Notes (Addendum)
VAD Coordinator accompanied patient to OR for chest closure. Left abdominal drive line exit site cleaned and re-dressed in OR. Anchor re-applied and intact. Accompanied patient back to 2H25 at completion of surgery.   Zada Girt RN, VAD Coordinator 24/7 VAD pager: [phone]

## 2017-08-20 NOTE — Anesthesia Postprocedure Evaluation (Signed)
Anesthesia Post Note  Patient: [name] [name]  Procedure(s) Performed: MEDIASTINAL REXPLORATION with evacuation of hematoma (Chest)     Patient location during evaluation: SICU Anesthesia Type: General Level of consciousness: sedated Pain management: pain level controlled Vital Signs Assessment: post-procedure vital signs reviewed and stable Respiratory status: patient remains intubated per anesthesia plan Cardiovascular status: stable Postop Assessment: no apparent nausea or vomiting Anesthetic complications: no    Last Vitals:  Vitals:   08/20/17 1000 08/20/17 1010  BP:  (!) 90/53  Pulse:    Resp: (!) 22   Temp: (!) 36.2 C   SpO2: 100%     Last Pain:  Vitals:   08/20/17 1000  TempSrc: Core  PainSc:                  Tesha Archambeau

## 2017-08-20 NOTE — Progress Notes (Signed)
NO weaned per MD order by 5 ppm.  Pt now on 25 ppm. RT will continue to monitor and when every hour as pt tolerates.

## 2017-08-20 NOTE — Progress Notes (Signed)
Abdominal xray obtained to confirm placement of cortrak.  Spoke with [place], dietician, and reviewed abdominal xray and Cate advised cortrak is post pyloric and okay to begin tube feeding.

## 2017-08-20 NOTE — Progress Notes (Signed)
Cortrak Tube Team Note:  Consult received to place a Cortrak feeding tube.   A 10 F Cortrak tube was placed in the RIGHT nare and secured with a nasal bridle at 91 cm. Pt with LVAD, interference with Cortrak monitor making it difficult to assess whether Cortrak tube is located in the stomach or in the post-pyloric position.   X-ray is required, abdominal x-ray has been ordered by the Cortrak team. Please confirm tube placement before using the Cortrak tube.   If the tube becomes dislodged please keep the tube and contact the Cortrak team at [email or web address] ([email or web address]) for replacement.  If after hours and replacement cannot be delayed, place a NG tube and confirm placement with an abdominal x-ray.   Kerman Passey MS, RD, [place], [place] [phone] Pager  [phone] Weekend/On-Call Pager

## 2017-08-20 NOTE — Progress Notes (Signed)
OT Cancellation Note  Patient Details Name: RADLEY BARTO MRN: [id number] DOB: [birth year]   Cancelled Treatment:    Reason Eval/Treat Not Completed: Patient not medically ready.  Pt sedated on vent.    Blaze Sandin [place], OTR/L [phone]   Lucille Passy M 08/20/2017, 8:34 AM

## 2017-08-20 NOTE — Progress Notes (Signed)
CSW met with wife at bedside. Wife reports patient making improvements and she is pleased with his care. She noted she will continue to limit visitors throughout the weekend. Wife feeling very supported by team and grateful for the support. CSW provided supportive intervention and will continue to follow throughout implant hospitalization. Raquel Sarna, [place], [place]

## 2017-08-20 NOTE — Progress Notes (Addendum)
Nutrition Follow Up  DOCUMENTATION CODES:   Obesity unspecified  INTERVENTION:    Initiate Vital High Protein at goal rate of 60 ml/h (1440 ml per day) and Prostat 30 ml BID  Provides 1640 kcals, 156 gm protein, 1203 ml free water daily  NEW NUTRITION DIAGNOSIS:   Inadequate oral intake related to inability to eat as evidenced by NPO status, ongoing  NEW GOAL:   Provide needs based on ASPEN/SCCM guidelines, progressing  MONITOR:   TF tolerance, Vent status, Labs, Weight trends, Skin, I & O's  ASSESSMENT:   67 yo male admitted with acute on chronic CHF, pt being worked up for destination LVAD. Pt with hx of CHF due to ICM, CAD s/p PCI, PAD, DM, HTN, HL, CKD II/III   Patient is currently intubated on ventilator support Temp (24hrs), Avg:97.3 F (36.3 C), Min:96.8 F (36 C), Max:98.4 F (36.9 C)  Pt s/p procedures: 6/17 LVAD insertion and tricuspid valve repair  6/18 mediastinal rexploration with evacuation of hematoma 6/20 evacuation of R-sided pericardial hematoma  Cortrak feeding tube in place. Tip in duodenum. Heart Failure Team note reviewed 6/21. OGT in place to LIS.  Medications include Reglan & Levophed. Labs reviewed. Na 134 (L). CBG's [phone].  RD to discontinue Vital 1.5 and order Vital High Protein. Verbal with Read Back order received per Dr. Prescott Gum.  Diet Order:   Diet Order           Diet NPO time specified  Diet effective now         EDUCATION NEEDS:   Education needs have been addressed  Skin:  Skin Assessment: Reviewed RN Assessment  Last BM:  6/17  Height:   Ht Readings from Last 1 Encounters:  08/13/17 5' 10.98" (1.803 m)   Weight:   Wt Readings from Last 1 Encounters:  08/20/17 259 lb 7.7 oz (117.7 kg)   BMI:  Body mass index is 36.21 kg/m.  Estimated Nutritional Needs:   Kcal:  6644-0347  Protein:  150-165 gm  Fluid:  per MD  Arthur Holms, RD, LDN Pager #: [phone] After-Hours Pager #: [phone]

## 2017-08-20 NOTE — Progress Notes (Signed)
LVAD Coordinator Rounding Not  Admitted 08/11/17 for initiation of Milrinone. 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 He was evaluated [facility] /Dr Posey Pronto on 5/9 for possible heart transplant.   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 Prescott Gum under Destination Therapy criteria. Dr [name] discussed heart transplant candidacy with Dr Posey Pronto. Given size and blood type there was concern he would not make it to transplant.   Pt remains intubated and sedated. Wife at bedside.   Vital signs: Temp:  99.1 HR: 90 A-line:  85/60 (74) Doppler: not done O2 Sat:  100% on ventilator Wt: 238>246>242>274>267 lbs   LVAD interrogation reveals:  Speed: 5300 Flow: 4.2 Power:  3.7w PI:  2.2 Alarms: 1 low flow Events: 1 PI event Hematocrit:  27 Fixed speed: 5300 Low speed limit: 5100  Drive Line: left abdominal dressing dry and intact; anchor in place and accurately applied. Will be changed daily by VAD coordinator, Nurse Davonna Belling, or trained caregiver using gauze dressing with silver strip.   Labs:  LDH trend: 274>287>245  INR trend:  1.71>1.92>1.57  Anticoagulation Plan: -INR Goal: 2.0 - 2.5 -ASA Dose: 325 mg daily until INR therapeutic  Blood Products:  Intra op: - 08/16/17> one platelet; 2 FFP - 08/17/17> ome FFP  Post op: - 08/16/17 > one unit PCs  Device:  - BiV Medtronic - Therapies: off  Respiratory: vented  Nitric Oxide:  30 ppm  Gtts: - Epi 1.5 mcg/min - Levophed 1 mcg/min - Milrinone 0.5 mcg/kg/min  Adverse Events on VAD: - 08/17/17>mediastinal reexploration with evacuation of mediastinal clot - 08/19/17>sternal closure  VAD education:  Pt remains intubated, unable to initiate education. Wife observing dressing changes when possible. She is practicing sterile technique with donning gloves.   Plan/Recommendations:  1. Daily dressing changes per VAD coordinator, nurse champion, or trained caregiver.  2. Page VAD Coordinator with any VAD  equipment or drive line issues.   Zada Girt RN, VAD Coordinator 24/7 VAD Pager: [phone]

## 2017-08-20 NOTE — Progress Notes (Signed)
ANTICOAGULATION CONSULT NOTE - Initial Consult  Pharmacy Consult for heparin Indication: LVAD  Allergies  Allergen Reactions  . Penicillins Hives, Itching and Other (See Comments)    Has patient had a PCN reaction causing immediate rash, facial/tongue/throat swelling, SOB or lightheadedness with hypotension:# # Yes # # Has patient had a PCN reaction causing severe rash involving mucus membranes or skin necrosis: No Has patient had a PCN reaction that required hospitalization: No Has patient had a PCN reaction occurring within the last 10 years: No If all of the above answers are "NO", then may proceed with Cephalosporin use.     Patient Measurements: Height: 5' 10.98" (180.3 cm) Weight: 259 lb 7.7 oz (117.7 kg) IBW/kg (Calculated) : 75.26 Heparin Dosing Weight: 98.3 kg  Vital Signs: Temp: 98.4 F (36.9 C) (06/21 1500) Temp Source: Core (06/21 1500) BP: 73/51 (06/21 1512) Pulse Rate: 88 (06/21 1500)  Labs: Recent Labs    08/18/17 0302  08/18/17 1717 08/19/17 0443 08/19/17 0453 08/19/17 1646 08/19/17 2005 08/20/17 0429 08/20/17 1221  HGB 8.6*   < >  --  8.6* 9.2* 9.5* 9.5* 8.3*  --   HCT 27.0*   < >  --  27.0* 27.0* 28.0* 28.0* 26.4*  --   PLT 96*  --   --  115*  --   --   --  134*  --   APTT 62*  --  78* 95*  --   --   --   --   --   LABPROT 21.8*  --   --  18.6*  --   --   --  16.8*  --   INR 1.92  --   --  1.57  --   --   --  1.37  --   HEPARINUNFRC <0.10*  --   --  0.18*  --   --   --   --  0.17*  CREATININE 0.88   < >  --  0.89 0.70  --  0.60* 0.86  --    < > = values in this interval not displayed.    Estimated Creatinine Clearance: 108.8 mL/min (by C-G formula based on SCr of 0.86 mg/dL).   Medical History: Past Medical History:  Diagnosis Date  . AICD (automatic cardioverter/defibrillator) present 02/05/2014   Upgrade to Medtronic biventricular ICD, serial number  BLD [id number]   . Atrial flutter ([place]) 04/2012   s/p TEE-EPS+RFCA 04/2012  . CAD (coronary  artery disease) 7903,8333 X 2    RCA-T, 70% PL (off CFX), 99% Prox LAD/90% Dist LAD, S/P TAXUS stent x 2  . CHF (congestive heart failure) ([place])   . Chronic anticoagulation   . Chronic systolic heart failure ([place])   . CKD (chronic kidney disease)   . Diabetic retinopathy ([place])   . DM type 2 (diabetes mellitus, type 2) (HCC)    insulin dependent  . HTN (hypertension)   . Hypercholesteremia    ablation  . ICD (implantable cardiac defibrillator) in place   . Ischemic cardiomyopathy March 2015   20-25% 2D   . Nephrolithiasis   . Ventricular tachycardia (HCC)     Medications:  Scheduled:  . acetaminophen  1,000 mg Oral Q6H   Or  . acetaminophen (TYLENOL) oral liquid 160 mg/5 mL  1,000 mg Per Tube Q6H  . aspirin EC  325 mg Oral Daily   Or  . aspirin  324 mg Per Tube Daily   Or  . aspirin  300 mg Rectal  Daily  . bisacodyl  10 mg Oral Daily   Or  . bisacodyl  10 mg Rectal Daily  . chlorhexidine gluconate (MEDLINE KIT)  15 mL Mouth Rinse BID  . Chlorhexidine Gluconate Cloth  6 each Topical Daily  . feeding supplement (PRO-STAT SUGAR FREE 64)  30 mL Per Tube BID  . insulin aspart  0-24 Units Subcutaneous Q4H  . insulin detemir  10 Units Subcutaneous BID  . levalbuterol  0.63 mg Nebulization Q6H  . mouth rinse  15 mL Mouth Rinse 10 times per day  . metoCLOPramide (REGLAN) injection  10 mg Intravenous Q6H  . rifampin  600 mg Oral Once  . sodium chloride flush  10-40 mL Intracatheter Q12H  . warfarin  5 mg Oral ONCE-1800    Assessment: 65 yom who underwent LVAD placement on 6/17. Heparin infusion has been running at 900 units/hr per physician. Underwent sternal closure yesterday on 6/20 - heparin was restarted this morning at 0500.  Initial heparin level since restart came back at subtherapeutic at 0.17. Hgb is down slightly to 8.3, plt 134, LDH stable. No infusion issues. No s/sx of bleeding.  Per conversation with Dr Prescott Gum, pharmacy okay to increase to goal range, with cap  on heparin dose at 1600 units/hr.    Goal of Therapy:  Heparin level 0.3-0.5 units/ml Monitor platelets by anticoagulation protocol: Yes   Plan:  Increase heparin infusion to 1050 units/hr without bolus Obtain heparin level in 6 hours  Start warfarin 5 mg tonight Monitor CBC, HL, and s/sx of bleeding  Doylene Canard, PharmD Clinical Pharmacist  Pager: [phone] Phone: [phone] 08/20/2017,3:55 PM

## 2017-08-20 NOTE — Addendum Note (Signed)
Addendum  created 08/20/17 1930 by Roberts Gaudy, MD   Sign clinical note

## 2017-08-20 NOTE — Progress Notes (Signed)
Anesthesiology Follow-up:  Post-op day one following sternal closure. Sternum left open after Heartmate 3 LVAD insertion on 08/16/17 due to R.heart failure.  Patient remains sedated on vent. Hemodynamically stable on Milrinone 0.5 mcg/kg/min Epinephrine 2 mcg/min and Levophed 2 mcg/min.   FiO2 0.50 TV- 600 RR-16 Peep- 5 PAP-26  VS: T- 37.5 BP 74/53 (65) HR- 89 (Afib) O2 sat 100% PA 35/16 CVP -11   VAD:  Pump Speed; 5200 Pump Flow: 4.4 lpm Power: 4 watts PI: 2.2  CoX 58.3 BUN/Cr- 8/0.83 K-3.7 Na- 133 glucose- 217 H/H- 8.3/26.4 platelets- 134,000  Patient appears stable following chest closure now weaning NO. CoX down to 58% from 72% earlier today, but other hemodynamic and lab parameters look good.  Roberts Gaudy

## 2017-08-20 NOTE — Progress Notes (Addendum)
ANTICOAGULATION CONSULT NOTE - Initial Consult  Pharmacy Consult for heparin Indication: LVAD  Allergies  Allergen Reactions  . Penicillins Hives, Itching and Other (See Comments)    Has patient had a PCN reaction causing immediate rash, facial/tongue/throat swelling, SOB or lightheadedness with hypotension:# # Yes # # Has patient had a PCN reaction causing severe rash involving mucus membranes or skin necrosis: No Has patient had a PCN reaction that required hospitalization: No Has patient had a PCN reaction occurring within the last 10 years: No If all of the above answers are "NO", then may proceed with Cephalosporin use.     Patient Measurements: Height: 5' 10.98" (180.3 cm) Weight: 259 lb 7.7 oz (117.7 kg) IBW/kg (Calculated) : 75.26 Heparin Dosing Weight: 98.3 kg  Vital Signs: Temp: 97.9 F (36.6 C) (06/21 1300) Temp Source: Core (06/21 1300) BP: 74/61 (06/21 1300) Pulse Rate: 31 (06/21 1300)  Labs: Recent Labs    08/18/17 0302  08/18/17 1717 08/19/17 0443 08/19/17 0453 08/19/17 1646 08/19/17 2005 08/20/17 0429 08/20/17 1221  HGB 8.6*   < >  --  8.6* 9.2* 9.5* 9.5* 8.3*  --   HCT 27.0*   < >  --  27.0* 27.0* 28.0* 28.0* 26.4*  --   PLT 96*  --   --  115*  --   --   --  134*  --   APTT 62*  --  78* 95*  --   --   --   --   --   LABPROT 21.8*  --   --  18.6*  --   --   --  16.8*  --   INR 1.92  --   --  1.57  --   --   --  1.37  --   HEPARINUNFRC <0.10*  --   --  0.18*  --   --   --   --  0.17*  CREATININE 0.88   < >  --  0.89 0.70  --  0.60* 0.86  --    < > = values in this interval not displayed.    Estimated Creatinine Clearance: 108.8 mL/min (by C-G formula based on SCr of 0.86 mg/dL).   Medical History: Past Medical History:  Diagnosis Date  . AICD (automatic cardioverter/defibrillator) present 02/05/2014   Upgrade to Medtronic biventricular ICD, serial number  BLD [id number]   . Atrial flutter ([place]) 04/2012   s/p TEE-EPS+RFCA 04/2012  . CAD (coronary  artery disease) 2703,5009 X 2    RCA-T, 70% PL (off CFX), 99% Prox LAD/90% Dist LAD, S/P TAXUS stent x 2  . CHF (congestive heart failure) ([place])   . Chronic anticoagulation   . Chronic systolic heart failure ([place])   . CKD (chronic kidney disease)   . Diabetic retinopathy ([place])   . DM type 2 (diabetes mellitus, type 2) (HCC)    insulin dependent  . HTN (hypertension)   . Hypercholesteremia    ablation  . ICD (implantable cardiac defibrillator) in place   . Ischemic cardiomyopathy March 2015   20-25% 2D   . Nephrolithiasis   . Ventricular tachycardia (HCC)     Medications:  Scheduled:  . acetaminophen  1,000 mg Oral Q6H   Or  . acetaminophen (TYLENOL) oral liquid 160 mg/5 mL  1,000 mg Per Tube Q6H  . aspirin EC  325 mg Oral Daily   Or  . aspirin  324 mg Per Tube Daily   Or  . aspirin  300 mg Rectal  Daily  . bisacodyl  10 mg Oral Daily   Or  . bisacodyl  10 mg Rectal Daily  . chlorhexidine gluconate (MEDLINE KIT)  15 mL Mouth Rinse BID  . Chlorhexidine Gluconate Cloth  6 each Topical Daily  . feeding supplement (PRO-STAT SUGAR FREE 64)  30 mL Per Tube BID  . insulin aspart  0-24 Units Subcutaneous Q4H  . insulin detemir  10 Units Subcutaneous BID  . levalbuterol  0.63 mg Nebulization Q6H  . mouth rinse  15 mL Mouth Rinse 10 times per day  . metoCLOPramide (REGLAN) injection  10 mg Intravenous Q6H  . rifampin  600 mg Oral Once  . sodium chloride flush  10-40 mL Intracatheter Q12H  . vancomycin 1000 mg in NS (1000 ml) irrigation for Dr. Roxy Manns case   Irrigation To OR    Assessment: 10 yom who underwent LVAD placement on 6/17. Heparin infusion has been running at 900 units/hr per physician. Underwent sternal closure yesterday on 6/20 - heparin was restarted this morning at 0500.  Initial heparin level since restart came back at subtherapeutic at 0.17. Hgb is down slightly to 8.3, plt 134, LDH stable. No infusion issues. No s/sx of bleeding.  Per conversation with Dr Prescott Gum, pharmacy okay to increase to goal range, with cap on heparin dose at 1600 units/hr.    Goal of Therapy:  Heparin level 0.3-0.5 units/ml Monitor platelets by anticoagulation protocol: Yes   Plan:  Increase heparin infusion to 1050 units/hr without bolus Obtain heparin level in 6 hours  Monitor CBC, HL, and s/sx of bleeding  Doylene Canard, PharmD Clinical Pharmacist  Pager: [phone] Phone: [phone] 08/20/2017,1:24 PM

## 2017-08-20 NOTE — Progress Notes (Signed)
LVAD Coordinator Rounding Not  Admitted 08/11/17 for initiation of Milrinone. 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 He was evaluated [facility] /Dr Posey Pronto on 5/9 for possible heart transplant.   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 Prescott Gum under Destination Therapy criteria. Dr [name] discussed heart transplant candidacy with Dr Posey Pronto. Given size and blood type there was concern he would not make it to transplant.   Pt remains intubated and sedated. Wife at bedside.   Vital signs: Temp:  97.5 HR: 86 A-line:  81/72 (77) Doppler: 80 O2 Sat:  100% on ventilator Wt: 238>246>242>274>267>259 lbs   LVAD interrogation reveals:  Speed: 5200 Flow: 4.1 Power:  3.4w PI:  2.3 Alarms: nonr Events: 6 PI event Hematocrit:  26 Fixed speed: 5200 Low speed limit: 4900  Drive Line: left abdominal dressing dry and intact; anchor in place and accurately applied. Will be changed daily by VAD coordinator, Nurse Davonna Belling, or trained caregiver using gauze dressing with silver strip.   Labs:  LDH trend: 274>287>245>240  INR trend:  1.71>1.92>1.57>1.37  Anticoagulation Plan: -INR Goal: 2.0 - 2.5 -ASA Dose: 325 mg daily until INR therapeutic  Blood Products:  Intra op: - 08/16/17> one platelet; 2 FFP - 08/17/17> ome FFP  Post op: - 08/16/17 > one unit PCs  Device:  - BiV Medtronic - Therapies: off  Respiratory: vented  Nitric Oxide:  30 ppm  Gtts: - Epi 2 mcg/min - Milrinone 0.5 mcg/kg/min - Lasix 6 mg/hr  Adverse Events on VAD: - 08/17/17>mediastinal reexploration with evacuation of mediastinal clot - 08/19/17>sternal closure  VAD education:  Pt remains intubated, unable to initiate education. Wife observing dressing changes when possible. She is practicing sterile technique with donning gloves.   Plan/Recommendations:  1. Daily dressing changes per VAD coordinator, nurse champion, or trained caregiver.  2. Page VAD Coordinator with any VAD  equipment or drive line issues.   Zada Girt RN, VAD Coordinator 24/7 VAD Pager: [phone]

## 2017-08-20 NOTE — Progress Notes (Signed)
ANTICOAGULATION CONSULT NOTE  Pharmacy Consult for heparin Indication: LVAD  Patient Measurements: Height: 5' 10.98" (180.3 cm) Weight: 259 lb 7.7 oz (117.7 kg) IBW/kg (Calculated) : 75.26 Heparin Dosing Weight: 98.3 kg  Vital Signs: Temp: 99.7 F (37.6 C) (06/21 2020) Temp Source: Core (06/21 1900) BP: 90/68 (06/21 2000) Pulse Rate: 90 (06/21 2020)  Labs: Recent Labs    08/18/17 0302  08/18/17 1717 08/19/17 0443  08/19/17 1646 08/19/17 2005 08/20/17 0429 08/20/17 1221 08/20/17 1550 08/20/17 2002  HGB 8.6*   < >  --  8.6*   < > 9.5* 9.5* 8.3*  --   --   --   HCT 27.0*   < >  --  27.0*   < > 28.0* 28.0* 26.4*  --   --   --   PLT 96*  --   --  115*  --   --   --  134*  --   --   --   APTT 62*  --  78* 95*  --   --   --   --   --   --   --   LABPROT 21.8*  --   --  18.6*  --   --   --  16.8*  --   --   --   INR 1.92  --   --  1.57  --   --   --  1.37  --   --   --   HEPARINUNFRC <0.10*  --   --  0.18*  --   --   --   --  0.17*  --  0.19*  CREATININE 0.88   < >  --  0.89   < >  --  0.60* 0.86  --  0.83  --    < > = values in this interval not displayed.     Medical History: Past Medical History:  Diagnosis Date  . AICD (automatic cardioverter/defibrillator) present 02/05/2014   Upgrade to Medtronic biventricular ICD, serial number  BLD [id number] H   . Atrial flutter ([place]) 04/2012   s/p TEE-EPS+RFCA 04/2012  . CAD (coronary artery disease) 3009,2330 X 2    RCA-T, 70% PL (off CFX), 99% Prox LAD/90% Dist LAD, S/P TAXUS stent x 2  . CHF (congestive heart failure) ([place])   . Chronic anticoagulation   . Chronic systolic heart failure ([place])   . CKD (chronic kidney disease)   . Diabetic retinopathy ([place])   . DM type 2 (diabetes mellitus, type 2) (HCC)    insulin dependent  . HTN (hypertension)   . Hypercholesteremia    ablation  . ICD (implantable cardiac defibrillator) in place   . Ischemic cardiomyopathy March 2015   20-25% 2D   . Nephrolithiasis   . Ventricular  tachycardia ([place])     Assessment: 40 yom who underwent LVAD placement on 6/17.  Underwent sternal closure yesterday on 6/20 - heparin was restarted this morning at 0500.  Heparin level remains subtherapeutic despite previous rate increase. Started warfarin also this evening.   Goal of Therapy:  Heparin level 0.3-0.5 units/ml Monitor platelets by anticoagulation protocol: Yes   Plan:  Increase heparin infusion to 1050 units/hr without bolus Max heparin rate 1600 units/hr for this patient Next level with AM labs Monitor CBC, HL, and s/sx of bleeding    Hughes Better, PharmD, BCPS Clinical Pharmacist 08/20/2017 8:55 PM

## 2017-08-21 ENCOUNTER — Inpatient Hospital Stay (HOSPITAL_COMMUNITY): Payer: PPO

## 2017-08-21 DIAGNOSIS — Z9889 Other specified postprocedural states: Secondary | ICD-10-CM

## 2017-08-21 DIAGNOSIS — Z95811 Presence of heart assist device: Secondary | ICD-10-CM

## 2017-08-21 LAB — COMPREHENSIVE METABOLIC PANEL
ALT: 17 U/L (ref 17–63)
AST: 45 U/L — ABNORMAL HIGH (ref 15–41)
Albumin: 2.4 g/dL — ABNORMAL LOW (ref 3.5–5.0)
Alkaline Phosphatase: 47 U/L (ref 38–126)
Anion gap: 9 (ref 5–15)
BUN: 11 mg/dL (ref 6–20)
CO2: 23 mmol/L (ref 22–32)
Calcium: 8.2 mg/dL — ABNORMAL LOW (ref 8.9–10.3)
Chloride: 100 mmol/L — ABNORMAL LOW (ref 101–111)
Creatinine, Ser: 0.93 mg/dL (ref 0.61–1.24)
GFR calc Af Amer: >60 mL/min (ref >60)
GFR calc non Af Amer: >60 mL/min (ref >60)
Glucose, Bld: 238 mg/dL — ABNORMAL HIGH (ref 65–99)
Potassium: 3.7 mmol/L (ref 3.5–5.1)
Sodium: 132 mmol/L — ABNORMAL LOW (ref 135–145)
Total Bilirubin: 1.3 mg/dL — ABNORMAL HIGH (ref 0.3–1.2)
Total Protein: 6.1 g/dL — ABNORMAL LOW (ref 6.5–8.1)

## 2017-08-21 LAB — BLOOD GAS, ARTERIAL
Acid-base deficit: 0.6 mmol/L (ref 0.0–2.0)
Bicarbonate: 23.6 mmol/L (ref 20.0–28.0)
Drawn by: 330991
FIO2: 50
MECHVT: 600 mL
O2 Saturation: 99.3 %
PEEP: 5 cmH2O
Patient temperature: 98.6
RATE: 16 resp/min
pCO2 arterial: 39.5 mmHg (ref 32.0–48.0)
pH, Arterial: 7.394 (ref 7.350–7.450)
pO2, Arterial: 138 mmHg — ABNORMAL HIGH (ref 83.0–108.0)

## 2017-08-21 LAB — COOXEMETRY PANEL
Carboxyhemoglobin: 1.9 % — ABNORMAL HIGH (ref 0.5–1.5)
Carboxyhemoglobin: 1.7 % — ABNORMAL HIGH (ref 0.5–1.5)
Methemoglobin: 1 % (ref 0.0–1.5)
Methemoglobin: 1 % (ref 0.0–1.5)
O2 Saturation: 69.9 %
O2 Saturation: 53.7 %
Total hemoglobin: 7.9 g/dL — ABNORMAL LOW (ref 12.0–16.0)
Total hemoglobin: 10.2 g/dL — ABNORMAL LOW (ref 12.0–16.0)

## 2017-08-21 LAB — CBC WITH DIFFERENTIAL/PLATELET
Abs Immature Granulocytes: 0.1 10*3/uL (ref 0.0–0.1)
Basophils Absolute: 0 10*3/uL (ref 0.0–0.1)
Basophils Relative: 0 %
Eosinophils Absolute: 0.1 10*3/uL (ref 0.0–0.7)
Eosinophils Relative: 1 %
HCT: 24.7 % — ABNORMAL LOW (ref 39.0–52.0)
Hemoglobin: 7.9 g/dL — ABNORMAL LOW (ref 13.0–17.0)
Immature Granulocytes: 1 %
Lymphocytes Relative: 6 %
Lymphs Abs: 0.5 10*3/uL — ABNORMAL LOW (ref 0.7–4.0)
MCH: 26.8 pg (ref 26.0–34.0)
MCHC: 32 g/dL (ref 30.0–36.0)
MCV: 83.7 fL (ref 78.0–100.0)
Monocytes Absolute: 1.1 10*3/uL — ABNORMAL HIGH (ref 0.1–1.0)
Monocytes Relative: 14 %
Neutro Abs: 6 10*3/uL (ref 1.7–7.7)
Neutrophils Relative %: 78 %
Platelets: 147 10*3/uL — ABNORMAL LOW (ref 150–400)
RBC: 2.95 MIL/uL — ABNORMAL LOW (ref 4.22–5.81)
RDW: 15.5 % (ref 11.5–15.5)
WBC: 7.8 10*3/uL (ref 4.0–10.5)

## 2017-08-21 LAB — BASIC METABOLIC PANEL
Anion gap: 8 (ref 5–15)
BUN: 13 mg/dL (ref 6–20)
CO2: 23 mmol/L (ref 22–32)
Calcium: 8 mg/dL — ABNORMAL LOW (ref 8.9–10.3)
Chloride: 101 mmol/L (ref 101–111)
Creatinine, Ser: 0.97 mg/dL (ref 0.61–1.24)
GFR calc Af Amer: >60 mL/min (ref >60)
GFR calc non Af Amer: >60 mL/min (ref >60)
Glucose, Bld: 291 mg/dL — ABNORMAL HIGH (ref 65–99)
Potassium: 3.8 mmol/L (ref 3.5–5.1)
Sodium: 132 mmol/L — ABNORMAL LOW (ref 135–145)

## 2017-08-21 LAB — MAGNESIUM: Magnesium: 1.9 mg/dL (ref 1.7–2.4)

## 2017-08-21 LAB — CBC
HCT: 26.4 % — ABNORMAL LOW (ref 39.0–52.0)
Hemoglobin: 8.3 g/dL — ABNORMAL LOW (ref 13.0–17.0)
MCH: 26.8 pg (ref 26.0–34.0)
MCHC: 31.4 g/dL (ref 30.0–36.0)
MCV: 85.2 fL (ref 78.0–100.0)
Platelets: 178 10*3/uL (ref 150–400)
RBC: 3.1 MIL/uL — ABNORMAL LOW (ref 4.22–5.81)
RDW: 15.7 % — ABNORMAL HIGH (ref 11.5–15.5)
WBC: 8.5 10*3/uL (ref 4.0–10.5)

## 2017-08-21 LAB — HEPARIN LEVEL (UNFRACTIONATED)
HEPARIN UNFRACTIONATED: 0.34 [IU]/mL (ref 0.30–0.70)
Heparin Unfractionated: 0.32 IU/mL (ref 0.30–0.70)

## 2017-08-21 LAB — GLUCOSE, CAPILLARY
GLUCOSE-CAPILLARY: 239 mg/dL — AB (ref 65–99)
GLUCOSE-CAPILLARY: 237 mg/dL — AB (ref 65–99)
GLUCOSE-CAPILLARY: 276 mg/dL — AB (ref 65–99)
GLUCOSE-CAPILLARY: 258 mg/dL — AB (ref 65–99)
Glucose-Capillary: 231 mg/dL — ABNORMAL HIGH (ref 65–99)
Glucose-Capillary: 266 mg/dL — ABNORMAL HIGH (ref 65–99)

## 2017-08-21 LAB — PROTIME-INR
INR: 1.42
Prothrombin Time: 17.2 seconds — ABNORMAL HIGH (ref 11.4–15.2)

## 2017-08-21 LAB — PHOSPHORUS: Phosphorus: 2.7 mg/dL (ref 2.5–4.6)

## 2017-08-21 LAB — AEROBIC/ANAEROBIC CULTURE W GRAM STAIN (SURGICAL/DEEP WOUND): Culture: NO GROWTH

## 2017-08-21 LAB — LACTATE DEHYDROGENASE: LDH: 220 U/L — ABNORMAL HIGH (ref 98–192)

## 2017-08-21 LAB — PREPARE RBC (CROSSMATCH)

## 2017-08-21 MED ORDER — AMIODARONE HCL IN DEXTROSE 360-4.14 MG/200ML-% IV SOLN
60.0000 mg/h | INTRAVENOUS | Status: AC
  Administered 2017-08-21 (×2): 60 mg/h via INTRAVENOUS
  Filled 2017-08-21: qty 200

## 2017-08-21 MED ORDER — POTASSIUM CHLORIDE 10 MEQ/50ML IV SOLN
10.0000 meq | INTRAVENOUS | Status: AC
  Administered 2017-08-21 – 2017-08-22 (×3): 10 meq via INTRAVENOUS
  Filled 2017-08-21 (×3): qty 50

## 2017-08-21 MED ORDER — FENTANYL CITRATE (PF) 2500 MCG/50ML IJ SOLN
0.0000 ug/h | Status: DC
  Administered 2017-08-21: 400 ug/h via INTRAVENOUS
  Administered 2017-08-21: 350 ug/h via INTRAVENOUS
  Administered 2017-08-22: 400 ug/h via INTRAVENOUS
  Administered 2017-08-22 – 2017-08-23 (×2): 300 ug/h via INTRAVENOUS
  Administered 2017-08-24 – 2017-08-25 (×3): 400 ug/h via INTRAVENOUS
  Administered 2017-08-25: 200 ug/h via INTRAVENOUS
  Filled 2017-08-21 (×8): qty 100

## 2017-08-21 MED ORDER — INSULIN DETEMIR 100 UNIT/ML ~~LOC~~ SOLN
20.0000 [IU] | Freq: Two times a day (BID) | SUBCUTANEOUS | Status: DC
  Administered 2017-08-21: 20 [IU] via SUBCUTANEOUS
  Filled 2017-08-21 (×2): qty 0.2

## 2017-08-21 MED ORDER — FUROSEMIDE 10 MG/ML IJ SOLN
15.0000 mg/h | INTRAVENOUS | Status: DC
  Administered 2017-08-21 – 2017-08-22 (×2): 10 mg/h via INTRAVENOUS
  Administered 2017-08-23 (×2): 12 mg/h via INTRAVENOUS
  Administered 2017-08-24 – 2017-08-27 (×5): 15 mg/h via INTRAVENOUS
  Filled 2017-08-21 (×3): qty 25
  Filled 2017-08-21: qty 21
  Filled 2017-08-21 (×3): qty 25
  Filled 2017-08-21: qty 21
  Filled 2017-08-21: qty 25
  Filled 2017-08-21: qty 21
  Filled 2017-08-21: qty 25
  Filled 2017-08-21: qty 8
  Filled 2017-08-21: qty 21
  Filled 2017-08-21 (×3): qty 25

## 2017-08-21 MED ORDER — FENTANYL 2500MCG IN NS 250ML (10MCG/ML) PREMIX INFUSION
0.0000 ug/h | INTRAVENOUS | Status: DC
Start: 2017-08-21 — End: 2017-08-21

## 2017-08-21 MED ORDER — POTASSIUM CHLORIDE 10 MEQ/50ML IV SOLN
10.0000 meq | INTRAVENOUS | Status: AC
  Administered 2017-08-21 (×2): 10 meq via INTRAVENOUS
  Filled 2017-08-21: qty 50

## 2017-08-21 MED ORDER — INSULIN DETEMIR 100 UNIT/ML ~~LOC~~ SOLN
30.0000 [IU] | Freq: Two times a day (BID) | SUBCUTANEOUS | Status: DC
  Administered 2017-08-21 – 2017-08-30 (×18): 30 [IU] via SUBCUTANEOUS
  Filled 2017-08-21 (×21): qty 0.3

## 2017-08-21 MED ORDER — WARFARIN SODIUM 5 MG PO TABS
5.0000 mg | ORAL_TABLET | Freq: Once | ORAL | Status: AC
  Administered 2017-08-21: 5 mg via ORAL
  Filled 2017-08-21: qty 1

## 2017-08-21 MED ORDER — VITAL HIGH PROTEIN PO LIQD
1000.0000 mL | ORAL | Status: DC
  Administered 2017-08-22 – 2017-08-26 (×6): 1000 mL

## 2017-08-21 MED ORDER — POTASSIUM CHLORIDE 10 MEQ/50ML IV SOLN
10.0000 meq | INTRAVENOUS | Status: AC
  Administered 2017-08-21 (×3): 10 meq via INTRAVENOUS
  Filled 2017-08-21 (×3): qty 50

## 2017-08-21 MED ORDER — FUROSEMIDE 10 MG/ML IJ SOLN
40.0000 mg | Freq: Once | INTRAMUSCULAR | Status: AC
  Administered 2017-08-21: 40 mg via INTRAVENOUS
  Filled 2017-08-21: qty 4

## 2017-08-21 MED ORDER — LEVOFLOXACIN IN D5W 750 MG/150ML IV SOLN
750.0000 mg | INTRAVENOUS | Status: DC
  Administered 2017-08-21 – 2017-08-22 (×2): 750 mg via INTRAVENOUS
  Filled 2017-08-21 (×2): qty 150

## 2017-08-21 MED ORDER — MAGNESIUM SULFATE 2 GM/50ML IV SOLN
2.0000 g | Freq: Once | INTRAVENOUS | Status: AC
  Administered 2017-08-21: 2 g via INTRAVENOUS
  Filled 2017-08-21: qty 50

## 2017-08-21 MED ORDER — SODIUM CHLORIDE 0.9% IV SOLUTION
Freq: Once | INTRAVENOUS | Status: AC
  Administered 2017-08-21: 10:00:00 via INTRAVENOUS

## 2017-08-21 MED ORDER — AMIODARONE HCL IN DEXTROSE 360-4.14 MG/200ML-% IV SOLN
30.0000 mg/h | INTRAVENOUS | Status: DC
  Administered 2017-08-21 – 2017-08-29 (×15): 30 mg/h via INTRAVENOUS
  Filled 2017-08-21 (×16): qty 200

## 2017-08-21 MED ORDER — AMIODARONE HCL IN DEXTROSE 360-4.14 MG/200ML-% IV SOLN
INTRAVENOUS | Status: AC
  Filled 2017-08-21: qty 200

## 2017-08-21 NOTE — Progress Notes (Addendum)
Patient became restless with increased blood pressure, CVP and PA pressures during RN's morning assessment.  Levophed was stopped and nitroglycerin started.  0745 - patient's heart went in to sustained V-Tach with rates in the 200's.  MD Aundra Dubin to bedside, orders received and amiodarone boluses given as well as drip started.  Patient converted back to baseline atrial fibrillation at 0758.  MD Prescott Gum to bedside and aware of V-Tach.  Plan to keep patient sedated and intubated at this time. Patient's wife at bedside and aware of updated plan and patient status.

## 2017-08-21 NOTE — Progress Notes (Signed)
RT note: Sputum sample obtained and sent down to main lab without complications.  Also increased nitric to 10ppm per MD order.  Will continue to monitor.

## 2017-08-21 NOTE — Progress Notes (Signed)
Patient awakened spontaneously and was very restless.  Patient's arterial line blood pressure 155/85, PAP 59/28, CVP 20, and respiratory rate up to the 40's. RN reassured patient through calming voice and reorientation but this did not calm patient.  RN decreased stimulation and patient began to relax and vital signs improved.

## 2017-08-21 NOTE — Progress Notes (Addendum)
HeartMate 3  postop day #5 rounding Note  Subjective:   HeartMate 3 implantation with combined tricuspid valve annuloplasty repair June 17 for mixed ischemic and nonischemic cardiomyopathy, cardiac amyloid disease and preoperative moderate to severe RV dysfunction, preoperative moderate to severe tricuspid regurgitation  Patient had significant RV dysfunction and sternum was left open to maintain adequate hemodynamics.  During the night after surgeryThe patient developed  further signs of right ventricular dysfunction and was taken emergently back for mediastinal reexploration.  A clot was found compressing the right atrium from oozing from the pericardium where adhesions had been taken down at the time of surgery.  The patient returned to the operating room on June 20 for delayed sternal closure.  Patient has been started on IV heparin since postop day 1.  He remains intubated on inhaled nitric oxide to optimize RV function.  We are weaning nitric oxide and following parameters of RV function.  So far he has tolerated wean to 10 ppm   Episode of VT this ami will hold vent wean and strt amiodarone  LVAD INTERROGATION:  HeartMate II LVAD:  Flow 4 liters/min, speed 5200, power 3.8, PI 2.8.  Controller intact.   Objective:    Vital Signs:   Temp:  [97.2 F (36.2 C)-100 F (37.8 C)] 99.7 F (37.6 C) (06/22 0700) Pulse Rate:  [31-91] 90 (06/22 0801) Resp:  [10-30] 18 (06/22 0801) BP: (73-108)/(42-88) 78/69 (06/22 0801) SpO2:  [95 %-100 %] 100 % (06/22 0801) Arterial Line BP: (66-149)/(48-106) 66/50 (06/22 0700) FiO2 (%):  [50 %] 50 % (06/22 0801) Weight:  [262 lb 12.6 oz (119.2 kg)] 262 lb 12.6 oz (119.2 kg) (06/22 0600) Last BM Date: 08/16/17 Mean arterial Pressure 75-80 mmHg  Intake/Output:   Intake/Output Summary (Last 24 hours) at 08/21/2017 0817 Last data filed at 08/21/2017 0700 Gross per 24 hour  Intake 4456.99 ml  Output 3200 ml  Net 1256.99 ml     Physical Exam: General:   Well appearing.  Intubated and sedated HEENT: normal Neck: supple. JVP . Carotids 2+ bilat; no bruits. No lymphadenopathy or thryomegaly appreciated. Cor: Mechanical heart sounds with LVAD hum present. Lungs: clear Abdomen: soft, nontender, nondistended. No hepatosplenomegaly. No bruits or masses. Good bowel sounds. Extremities: no cyanosis, clubbing, rash, edema Neuro: alert & orientedx3, cranial nerves grossly intact. moves all 4 extremities w/o difficulty. Affect pleasant  Telemetry: V paced at 90/min for underlying A. fib  Labs: Basic Metabolic Panel: Recent Labs  Lab 08/17/17 0314  08/17/17 1649  08/18/17 0302  08/19/17 0443 08/19/17 0453 08/19/17 1646 08/19/17 2005 08/20/17 0429 08/20/17 1550 08/21/17 0335  NA 134*   < >  --    < > 134*   < > 135 136 135 151* 134* 133* 132*  K 4.4   < >  --    < > 4.0   < > 3.3* 3.3* 3.3* 2.9* 3.9 3.7 3.7  CL 106   < >  --    < > 106   < > 104 98*  --  98* 102 102 100*  CO2 20*   < >  --   --  21*  --  24  --   --   --  '24 25 23  ' GLUCOSE 134*   < >  --    < > 122*   < > 160* 165*  --  141* 197* 217* 238*  BUN 9   < >  --    < >  7   < > 7 6  --  4* 5* 8 11  CREATININE 0.90   < > 0.89   < > 0.88   < > 0.89 0.70  --  0.60* 0.86 0.83 0.93  CALCIUM 8.9   < >  --   --  8.6*  --  8.4*  --   --   --  8.3* 8.2* 8.2*  MG 1.9  --  1.7  --  1.6*  --  1.5*  --   --   --  1.6*  --   --   PHOS 3.5  --   --   --  2.5  --  2.9  --   --   --  3.0  --  2.7   < > = values in this interval not displayed.    Liver Function Tests: Recent Labs  Lab 08/17/17 0314 08/18/17 0302 08/19/17 0443 08/20/17 0429 08/21/17 0335  AST 68* 73* 48* 53* 45*  ALT 19 16* 16* 16* 17  ALKPHOS 45 78 49 46 47  BILITOT 2.7* 1.8* 1.6* 1.9* 1.3*  PROT 5.9* 5.5* 5.7* 5.7* 6.1*  ALBUMIN 3.3* 3.0* 2.6* 2.7* 2.4*   No results for input(s): LIPASE, AMYLASE in the last 168 hours. No results for input(s): AMMONIA in the last 168 hours.  CBC: Recent Labs  Lab 08/17/17 0314   08/17/17 1649  08/18/17 0302  08/19/17 0443 08/19/17 0453 08/19/17 1646 08/19/17 2005 08/20/17 0429 08/21/17 0335  WBC 9.0   < > 8.6  --  9.4  --  10.0  --   --   --  9.1 7.8  NEUTROABS 7.3  --   --   --  7.4  --  8.0*  --   --   --  7.7 6.0  HGB 10.0*   < > 8.4*   < > 8.6*   < > 8.6* 9.2* 9.5* 9.5* 8.3* 7.9*  HCT 31.2*   < > 26.4*   < > 27.0*   < > 27.0* 27.0* 28.0* 28.0* 26.4* 24.7*  MCV 85.5   < > 85.7  --  86.0  --  85.4  --   --   --  85.2 83.7  PLT 119*   < > 92*  --  96*  --  115*  --   --   --  134* 147*   < > = values in this interval not displayed.    INR: Recent Labs  Lab 08/17/17 0940 08/18/17 0302 08/19/17 0443 08/20/17 0429 08/21/17 0335  INR 1.95 1.92 1.57 1.37 1.42    Other results:  EKG:   Imaging: Dg Chest Port 1 View  Result Date: 08/21/2017 CLINICAL DATA:  Left ventricular assist device present. EXAM: PORTABLE CHEST 1 VIEW COMPARISON:  08/20/2017 FINDINGS: Endotracheal tube terminates 3.5 cm above the carina. Right jugular Swan-Ganz catheter terminates over the main pulmonary artery. An additional right jugular catheter remains in place. A small caliber enteric tube and a new feeding tube both course into the left upper abdomen with tips not imaged. An ICD and left ventricular assist device are unchanged in appearance. A mediastinal drain and left chest tube remain. Pulmonary edema and left basilar atelectasis are similar to the prior study. No large pleural effusion or pneumothorax is identified. IMPRESSION: Unchanged appearance of the chest including mild pulmonary edema and basilar atelectasis. Electronically Signed   By: Logan Bores M.D.   On: 08/21/2017 08:08   Dg Chest  Port 1 View  Result Date: 08/20/2017 CLINICAL DATA:  LVAD EXAM: PORTABLE CHEST 1 VIEW COMPARISON:  08/19/2017 FINDINGS: Cardiac shadow remains enlarged. Defibrillator as well as left ventricular assist device are again seen. Swan-Ganz catheter is noted in the pulmonary outflow tract.  Endotracheal tube and nasogastric catheter are seen in satisfactory position. Right jugular central line is noted as well. Left thoracostomy catheter and mediastinal drain are seen. No pneumothorax is noted. Mild residual pulmonary edema is noted stable from the prior exam. No new focal infiltrate is seen. IMPRESSION: Overall stable appearance of the chest when compared with the previous day. Electronically Signed   By: Inez Catalina M.D.   On: 08/20/2017 07:24   Dg Chest Port 1 View  Result Date: 08/19/2017 CLINICAL DATA:  Left ventricular assist device EXAM: PORTABLE CHEST 1 VIEW COMPARISON:  08/19/2017 FINDINGS: Endotracheal tube in good position. Swan-Ganz catheter in the main pulmonary artery. NG tube in place. Left chest tube in place. No pneumothorax Pacemaker/AICD unchanged in position. Left ventricular assist device unchanged in position Improvement in bilateral airspace disease most likely clearing edema. There is mild residual atelectasis in the bases IMPRESSION: Improving pulmonary edema.  No pneumothorax. Electronically Signed   By: Franchot Gallo M.D.   On: 08/19/2017 18:18   Dg Abd Portable 1v  Result Date: 08/20/2017 CLINICAL DATA:  Feeding tube placement EXAM: PORTABLE ABDOMEN - 1 VIEW COMPARISON:  None. FINDINGS: Feeding tube tip is at the level of the first portion of the duodenum. There is no bowel dilatation or air-fluid level to suggest bowel obstruction. No free air. Left ventricular assist device present. IMPRESSION: Feeding tube tip at first portion of duodenum. No bowel obstruction or free air evident. Electronically Signed   By: Lowella Grip III M.D.   On: 08/20/2017 13:03     Medications:     Scheduled Medications: . sodium chloride   Intravenous Once  . acetaminophen  1,000 mg Oral Q6H   Or  . acetaminophen (TYLENOL) oral liquid 160 mg/5 mL  1,000 mg Per Tube Q6H  . aspirin EC  325 mg Oral Daily   Or  . aspirin  324 mg Per Tube Daily   Or  . aspirin  300 mg  Rectal Daily  . bisacodyl  10 mg Oral Daily   Or  . bisacodyl  10 mg Rectal Daily  . chlorhexidine gluconate (MEDLINE KIT)  15 mL Mouth Rinse BID  . Chlorhexidine Gluconate Cloth  6 each Topical Daily  . feeding supplement (PRO-STAT SUGAR FREE 64)  30 mL Per Tube BID  . furosemide  40 mg Intravenous Once  . insulin aspart  0-24 Units Subcutaneous Q4H  . insulin detemir  20 Units Subcutaneous BID  . levalbuterol  0.63 mg Nebulization TID  . mouth rinse  15 mL Mouth Rinse 10 times per day  . metoCLOPramide (REGLAN) injection  10 mg Intravenous Q6H  . rifampin  600 mg Oral Once  . sodium chloride flush  10-40 mL Intracatheter Q12H  . warfarin  5 mg Oral ONCE-1800  . Warfarin - Pharmacist Dosing Inpatient   Does not apply q1800    Infusions: . sodium chloride Stopped (08/21/17 0615)  . sodium chloride    . sodium chloride 10 mL/hr at 08/21/17 0200  . sodium chloride Stopped (08/21/17 2751)  . amiodarone     Followed by  . amiodarone    . amiodarone    . dexmedetomidine (PRECEDEX) IV infusion 1 mcg/kg/hr (08/21/17 0700)  .  EPINEPHrine 4 mg in dextrose 5% 250 mL infusion (16 mcg/mL) 2 mcg/min (08/21/17 0700)  . famotidine (PEPCID) IV Stopped (08/20/17 2239)  . feeding supplement (VITAL HIGH PROTEIN) 1,000 mL (08/21/17 0655)  . fentaNYL infusion INTRAVENOUS    . furosemide (LASIX) infusion    . heparin 1,200 Units/hr (08/21/17 0700)  . lactated ringers Stopped (08/18/17 0933)  . lactated ringers 10 mL/hr at 08/21/17 0700  . lactated ringers Stopped (08/19/17 1602)  . milrinone 0.5 mcg/kg/min (08/21/17 0700)  . nitroGLYCERIN 5 mcg/min (08/21/17 0739)  . norepinephrine (LEVOPHED) Adult infusion 1 mcg/min (08/21/17 0700)  . potassium chloride 10 mEq (08/21/17 0721)  . potassium chloride      PRN Medications: sodium chloride, fentaNYL, lactated ringers, midazolam, morphine injection, ondansetron (ZOFRAN) IV, oxyCODONE, sodium chloride flush   Assessment:  Mixed ischemic and  nonischemic cardiomyopathy with cardiac amyloid disease Preoperative moderate to severe RV dysfunction as well as moderate to severe tricuspid regurgitation Stable after delayed sternal closure  Plan/Discussion:   Continue RV support with inotropic agents while inhaled nitric oxide is being weaned and monitor CVP and LVAD flow carefully  CXR, ABG ok for vent wean but with VT will keep sedated on vent today  Increase lasix for elevated CVP  Start Coumadin with heparin bridge now that the sternum is been closed.  Coumadin dose per pharmacy  Cor tract feeding tube placed for nutritional support I reviewed the LVAD parameters from today, and compared the results to the patient's prior recorded data.  No programming changes were made.  The LVAD is functioning within specified parameters.  The patient performs LVAD self-test daily.  LVAD interrogation was negative for any significant power changes, alarms or PI events/speed drops.  LVAD equipment check completed and is in good working order.  Back-up equipment present.   LVAD education done on emergency procedures and precautions and reviewed exit site care.  Length of Stay: Butler III 08/21/2017, 8:17 AM

## 2017-08-21 NOTE — Progress Notes (Signed)
PT Cancellation Note  Patient Details Name: PERCY WINTERROWD MRN: [id number] DOB: 1951-01-24   Cancelled Treatment:    Reason Eval/Treat Not Completed: Medical issues which prohibited therapy(Pt coded this am. Nurse asked PT to return Monday. )   Brady 08/21/2017, 2:18 PM  [place] Rasheeda [place],PT Acute Rehabilitation [phone] (pager)

## 2017-08-21 NOTE — Progress Notes (Signed)
[place] for heparin/Coumadin Indication: LVAD  Allergies  Allergen Reactions  . Penicillins Hives, Itching and Other (See Comments)    Has patient had a PCN reaction causing immediate rash, facial/tongue/throat swelling, SOB or lightheadedness with hypotension:# # Yes # # Has patient had a PCN reaction causing severe rash involving mucus membranes or skin necrosis: No Has patient had a PCN reaction that required hospitalization: No Has patient had a PCN reaction occurring within the last 10 years: No If all of the above answers are "NO", then may proceed with Cephalosporin use.     Patient Measurements: Height: 5' 10.98" (180.3 cm) Weight: 262 lb 12.6 oz (119.2 kg) IBW/kg (Calculated) : 75.26 Heparin Dosing Weight: 98.3 kg  Vital Signs: Temp: 99.7 F (37.6 C) (06/22 0700) BP: 86/69 (06/22 0500) Pulse Rate: 90 (06/22 0700)  Labs: Recent Labs    08/18/17 1717  08/19/17 0443  08/19/17 2005 08/20/17 0429 08/20/17 1221 08/20/17 1550 08/20/17 2002 08/21/17 0335 08/21/17 0346  HGB  --   --  8.6*   < > 9.5* 8.3*  --   --   --  7.9*  --   HCT  --   --  27.0*   < > 28.0* 26.4*  --   --   --  24.7*  --   PLT  --   --  115*  --   --  134*  --   --   --  147*  --   APTT 78*  --  95*  --   --   --   --   --   --   --   --   LABPROT  --   --  18.6*  --   --  16.8*  --   --   --  17.2*  --   INR  --   --  1.57  --   --  1.37  --   --   --  1.42  --   HEPARINUNFRC  --    < > 0.18*  --   --   --  0.17*  --  0.19*  --  0.32  CREATININE  --   --  0.89   < > 0.60* 0.86  --  0.83  --  0.93  --    < > = values in this interval not displayed.    Estimated Creatinine Clearance: 101.3 mL/min (by C-G formula based on SCr of 0.93 mg/dL).   Medical History: Past Medical History:  Diagnosis Date  . AICD (automatic cardioverter/defibrillator) present 02/05/2014   Upgrade to [employer] biventricular ICD, serial number  [id number]   . Atrial flutter  ([place]) 04/2012   s/p TEE-EPS+RFCA 04/2012  . CAD (coronary artery disease) 2094,7096 X 2    RCA-T, 70% PL (off CFX), 99% Prox LAD/90% Dist LAD, S/P TAXUS stent x 2  . CHF (congestive heart failure) ([place])   . Chronic anticoagulation   . Chronic systolic heart failure ([place])   . CKD (chronic kidney disease)   . Diabetic retinopathy ([place])   . DM type 2 (diabetes mellitus, type 2) ([facility])    insulin dependent  . HTN (hypertension)   . Hypercholesteremia    ablation  . ICD (implantable cardiac defibrillator) in place   . Ischemic cardiomyopathy March 2015   20-25% 2D   . Nephrolithiasis   . Ventricular tachycardia ([facility])     Medications:  Scheduled:  . acetaminophen  1,000  mg Oral Q6H   Or  . acetaminophen (TYLENOL) oral liquid 160 mg/5 mL  1,000 mg Per Tube Q6H  . aspirin EC  325 mg Oral Daily   Or  . aspirin  324 mg Per Tube Daily   Or  . aspirin  300 mg Rectal Daily  . bisacodyl  10 mg Oral Daily   Or  . bisacodyl  10 mg Rectal Daily  . chlorhexidine gluconate (MEDLINE KIT)  15 mL Mouth Rinse BID  . Chlorhexidine Gluconate Cloth  6 each Topical Daily  . feeding supplement (PRO-STAT SUGAR FREE 64)  30 mL Per Tube BID  . insulin aspart  0-24 Units Subcutaneous Q4H  . insulin detemir  10 Units Subcutaneous BID  . levalbuterol  0.63 mg Nebulization TID  . mouth rinse  15 mL Mouth Rinse 10 times per day  . metoCLOPramide (REGLAN) injection  10 mg Intravenous Q6H  . rifampin  600 mg Oral Once  . sodium chloride flush  10-40 mL Intracatheter Q12H  . Warfarin - Pharmacist Dosing Inpatient   Does not apply q1800    Assessment: 28 yom who underwent LVAD placement on 6/17. Heparin infusion has been running at 900 units/hr per physician. Underwent sternal closure yesterday on 6/20.  Heparin level at goal this morning, confirmatory level this afternoon.  Pharmacy dosing Coumadin, had first dose of 5 mg last night.  INR with slight increase today.  Goal of Therapy:  Heparin level  0.3-0.5 units/ml Monitor platelets by anticoagulation protocol: Yes   Plan:  Continue IV heparin at current rate. Obtain heparin level at noon.  Warfarin 5 mg again tonight. Monitor CBC, heparin level, INR, and s/sx of bleeding  Marguerite Olea, [facility] Clinical Pharmacist Phone [phone]  08/21/2017 7:37 AM

## 2017-08-21 NOTE — Progress Notes (Signed)
  Amiodarone Drug - Drug Interaction Consult Note  Recommendations: Monitor/replace electrolytes  Amiodarone is metabolized by the cytochrome P450 system and therefore has the potential to cause many drug interactions. Amiodarone has an average plasma half-life of 50 days (range 20 to 100 days).   There is potential for drug interactions to occur several weeks or months after stopping treatment and the onset of drug interactions may be slow after initiating amiodarone.   []  Statins: Increased risk of myopathy. Simvastatin- restrict dose to 20mg  daily. Other statins: counsel patients to report any muscle pain or weakness immediately.  [x]  Anticoagulants: Amiodarone can increase anticoagulant effect. Consider warfarin dose reduction. Patients should be monitored closely and the dose of anticoagulant altered accordingly, remembering that amiodarone levels take several weeks to stabilize.  []  Antiepileptics: Amiodarone can increase plasma concentration of phenytoin, the dose should be reduced. Note that small changes in phenytoin dose can result in large changes in levels. Monitor patient and counsel on signs of toxicity.  []  Beta blockers: increased risk of bradycardia, AV block and myocardial depression. Sotalol - avoid concomitant use.  []   Calcium channel blockers (diltiazem and verapamil): increased risk of bradycardia, AV block and myocardial depression.  []   Cyclosporine: Amiodarone increases levels of cyclosporine. Reduced dose of cyclosporine is recommended.  []  Digoxin dose should be halved when amiodarone is started.  [x]  Diuretics: increased risk of cardiotoxicity if hypokalemia occurs.  []  Oral hypoglycemic agents (glyburide, glipizide, glimepiride): increased risk of hypoglycemia. Patient's glucose levels should be monitored closely when initiating amiodarone therapy.   [x]  Drugs that prolong the QT interval:  Torsades de pointes risk may be increased with concurrent use - avoid  if possible.  Monitor QTc, also keep magnesium/potassium WNL if concurrent therapy can't be avoided. Marland Kitchen Antibiotics: e.g. fluoroquinolones, erythromycin. . Antiarrhythmics: e.g. quinidine, procainamide, disopyramide, sotalol. . Antipsychotics: e.g. phenothiazines, haloperidol.  . Lithium, tricyclic antidepressants, and methadone. Thank You,  Pat Patrick  08/21/2017 8:16 AM

## 2017-08-21 NOTE — Progress Notes (Signed)
Stopped in to say hello to [name] and his family.  Thankful for the nurse who encouraged me today as I went in to visit with him.  Will remain available as needed and will continue to follow.    08/21/17 1644  Clinical Encounter Type  Visited With Patient;Health care provider  Visit Type Follow-up;Spiritual support

## 2017-08-21 NOTE — Progress Notes (Signed)
Patient ID: [name] [name], male   DOB: 09-14-1950, 67 y.o.   MRN: [id number]     Advanced Heart Failure Rounding Note  PCP-Cardiologist: No primary care provider on file.   Subjective:    Events 6/17 Underwent HM-3 implant -> Unable to close chest with RV failure and high intrathoracic pressure 6/18 Taken back to OR for evacuation of hematoma. NO RVAD needed. Left sternum open  08/19/17 Taken back to OR for sternal closure 6/22 VT => amiodarone started.   Drips Milrinone 0.5 Epi 2 NE 8 (post-VT) Heparin  Lasix 8 mg/hr  UOP somewhat sluggish yesterday, PA pressure/CVP higher.   Remains intubated and sedated  VT this morning => initially hemodynamically stable and resolved with amiodarone bolus.  Occurred again with fall in flow and MAP but converted again with amiodarone bolus prior to cardioversion. Now on amiodarone gtt.   Swan CVP 16 PA 42/22 CVP 9-10 CI 2.4 Co-ox 70%  LVAD Interrogation HM 3: Speed: 5200 Flow: 3.9 PI: 3.3 Power: 3.6. PI events + low flow around time of VT   Objective:   Weight Range: 262 lb 12.6 oz (119.2 kg) Body mass index is 36.67 kg/m.   Vital Signs:   Temp:  [97.2 F (36.2 C)-100 F (37.8 C)] 99.7 F (37.6 C) (06/22 0700) Pulse Rate:  [31-91] 90 (06/22 0801) Resp:  [10-30] 18 (06/22 0801) BP: (73-108)/(42-88) 78/69 (06/22 0801) SpO2:  [95 %-100 %] 100 % (06/22 0801) Arterial Line BP: (66-149)/(48-106) 66/50 (06/22 0700) FiO2 (%):  [50 %] 50 % (06/22 0801) Weight:  [262 lb 12.6 oz (119.2 kg)] 262 lb 12.6 oz (119.2 kg) (06/22 0600) Last BM Date: 08/16/17  Weight change: Filed Weights   08/19/17 0459 08/20/17 0500 08/21/17 0600  Weight: 267 lb 13.7 oz (121.5 kg) 259 lb 7.7 oz (117.7 kg) 262 lb 12.6 oz (119.2 kg)    Intake/Output:   Intake/Output Summary (Last 24 hours) at 08/21/2017 0816 Last data filed at 08/21/2017 0700 Gross per 24 hour  Intake 4456.99 ml  Output 3200 ml  Net 1256.99 ml      Physical Exam    General:  Intubated/sedated HEENT: Normal. Neck: Supple, JVP 10-12 cm cm. Carotids OK.  Cardiac:  Mechanical heart sounds with LVAD hum present.  Lungs:  CTAB, normal effort.  Abdomen:  NT, ND, no HSM. No bruits or masses. +BS  LVAD exit site: Well-healed and incorporated. Dressing dry and intact. No erythema or drainage. Stabilization device present and accurately applied. Driveline dressing changed daily per sterile technique. Extremities:  Warm and dry. No cyanosis, clubbing, rash. 1+ ankle edema.  Neuro:  Will awaken and follow coommands  Telemetry   V paced 90s => VT, personally reviewed.   Labs    CBC Recent Labs    08/20/17 0429 08/21/17 0335  WBC 9.1 7.8  NEUTROABS 7.7 6.0  HGB 8.3* 7.9*  HCT 26.4* 24.7*  MCV 85.2 83.7  PLT 134* 517*   Basic Metabolic Panel Recent Labs    08/19/17 0443  08/20/17 0429 08/20/17 1550 08/21/17 0335  NA 135   < > 134* 133* 132*  K 3.3*   < > 3.9 3.7 3.7  CL 104   < > 102 102 100*  CO2 24  --  _0 GLUCOSE 160*   < > 197* 217* 238*  BUN 7   < > 5* 8 11  CREATININE 0.89   < > 0.86 0.83 0.93  CALCIUM 8.4*  --  8.3* 8.2* 8.2*  MG 1.5*  --  1.6*  --   --   PHOS 2.9  --  3.0  --  2.7   < > = values in this interval not displayed.   Liver Function Tests Recent Labs    08/20/17 0429 08/21/17 0335  AST 53* 45*  ALT 16* 17  ALKPHOS 46 47  BILITOT 1.9* 1.3*  PROT 5.7* 6.1*  ALBUMIN 2.7* 2.4*   No results for input(s): LIPASE, AMYLASE in the last 72 hours. Cardiac Enzymes No results for input(s): CKTOTAL, CKMB, CKMBINDEX, TROPONINI in the last 72 hours.  BNP: BNP (last 3 results) Recent Labs    01/15/17 0911 08/11/17 1438 08/17/17 0314  BNP 159.6* 529.3* 266.4*    ProBNP (last 3 results) Recent Labs    11/12/16 1559  PROBNP 388.0*     D-Dimer No results for input(s): DDIMER in the last 72 hours. Hemoglobin A1C No results for input(s): HGBA1C in the last 72 hours. Fasting Lipid Panel No results for input(s):  CHOL, HDL, LDLCALC, TRIG, CHOLHDL, LDLDIRECT in the last 72 hours. Thyroid Function Tests No results for input(s): TSH, T4TOTAL, T3FREE, THYROIDAB in the last 72 hours.  Invalid input(s): FREET3  Other results:   Imaging    Dg Chest Port 1 View  Result Date: 08/21/2017 CLINICAL DATA:  Left ventricular assist device present. EXAM: PORTABLE CHEST 1 VIEW COMPARISON:  08/20/2017 FINDINGS: Endotracheal tube terminates 3.5 cm above the carina. Right jugular Swan-Ganz catheter terminates over the main pulmonary artery. An additional right jugular catheter remains in place. A small caliber enteric tube and a new feeding tube both course into the left upper abdomen with tips not imaged. An ICD and left ventricular assist device are unchanged in appearance. A mediastinal drain and left chest tube remain. Pulmonary edema and left basilar atelectasis are similar to the prior study. No large pleural effusion or pneumothorax is identified. IMPRESSION: Unchanged appearance of the chest including mild pulmonary edema and basilar atelectasis. Electronically Signed   By: Logan Bores M.D.   On: 08/21/2017 08:08   Dg Abd Portable 1v  Result Date: 08/20/2017 CLINICAL DATA:  Feeding tube placement EXAM: PORTABLE ABDOMEN - 1 VIEW COMPARISON:  None. FINDINGS: Feeding tube tip is at the level of the first portion of the duodenum. There is no bowel dilatation or air-fluid level to suggest bowel obstruction. No free air. Left ventricular assist device present. IMPRESSION: Feeding tube tip at first portion of duodenum. No bowel obstruction or free air evident. Electronically Signed   By: Lowella Grip III M.D.   On: 08/20/2017 13:03     Medications:     Scheduled Medications: . sodium chloride   Intravenous Once  . acetaminophen  1,000 mg Oral Q6H   Or  . acetaminophen (TYLENOL) oral liquid 160 mg/5 mL  1,000 mg Per Tube Q6H  . aspirin EC  325 mg Oral Daily   Or  . aspirin  324 mg Per Tube Daily   Or  .  aspirin  300 mg Rectal Daily  . bisacodyl  10 mg Oral Daily   Or  . bisacodyl  10 mg Rectal Daily  . chlorhexidine gluconate (MEDLINE KIT)  15 mL Mouth Rinse BID  . Chlorhexidine Gluconate Cloth  6 each Topical Daily  . feeding supplement (PRO-STAT SUGAR FREE 64)  30 mL Per Tube BID  . furosemide  40 mg Intravenous Once  . insulin aspart  0-24 Units Subcutaneous Q4H  . insulin  detemir  20 Units Subcutaneous BID  . levalbuterol  0.63 mg Nebulization TID  . mouth rinse  15 mL Mouth Rinse 10 times per day  . metoCLOPramide (REGLAN) injection  10 mg Intravenous Q6H  . rifampin  600 mg Oral Once  . sodium chloride flush  10-40 mL Intracatheter Q12H  . warfarin  5 mg Oral ONCE-1800  . Warfarin - Pharmacist Dosing Inpatient   Does not apply q1800    Infusions: . sodium chloride Stopped (08/21/17 0615)  . sodium chloride    . sodium chloride 10 mL/hr at 08/21/17 0200  . sodium chloride Stopped (08/21/17 3419)  . amiodarone     Followed by  . amiodarone    . amiodarone    . dexmedetomidine (PRECEDEX) IV infusion 1 mcg/kg/hr (08/21/17 0700)  . EPINEPHrine 4 mg in dextrose 5% 250 mL infusion (16 mcg/mL) 2 mcg/min (08/21/17 0700)  . famotidine (PEPCID) IV Stopped (08/20/17 2239)  . feeding supplement (VITAL HIGH PROTEIN) 1,000 mL (08/21/17 0655)  . fentaNYL infusion INTRAVENOUS    . furosemide (LASIX) infusion    . heparin 1,200 Units/hr (08/21/17 0700)  . lactated ringers Stopped (08/18/17 0933)  . lactated ringers 10 mL/hr at 08/21/17 0700  . lactated ringers Stopped (08/19/17 1602)  . milrinone 0.5 mcg/kg/min (08/21/17 0700)  . nitroGLYCERIN 5 mcg/min (08/21/17 0739)  . norepinephrine (LEVOPHED) Adult infusion 1 mcg/min (08/21/17 0700)  . potassium chloride 10 mEq (08/21/17 0721)  . potassium chloride      PRN Medications: sodium chloride, fentaNYL, lactated ringers, midazolam, morphine injection, ondansetron (ZOFRAN) IV, oxyCODONE, sodium chloride flush    Patient Profile    [name] [name] is a 67 y.o. 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   Directly admitted with persistent low cardiac output for milrinone initiation for home.  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 Acute/Chronic systolic CHF with biventricular failure-> cardiogenic shock: Echo  08/13/2017 EF 20-25%. s/p  Medtronic BiV ICD in place. Cath 12/18 with stable 1v CAD.  s/p HM-3 implant 6/17. Unable to close chest due to high-intrathoracic pressures and RV failure. Taken back to OR 08/17/17 with evacuation of hematoma with improvement.  VT this morning, now on norepinephrine 8/epinephrine 2/milrinone 0.5.  MAP 80s again. CI 2.4 with co-ox 70%.  PA pressure and CVP higher, UOP not vigorous on Lasix gtt. VAD interrogation with low flow event at time of VT.  - Bolus Lasix with 40 mg IV then increase to 10 mg/hr.  - Should be able to wean back off norepinephrine.  - With VT events this morning, plan to delay extubation to tomorrow.  2. [name]: Recent cath with stable CAD as above - No change to current plan.   3. [name]: Recent A1c 8.3 on 6/3.  - Cover with SSI in house.  4. CKD II-III: Creatinine stable.   5. [name]: Recurrent VT this morning x 2, flow dropped the second time.  Started amiodarone gtt, now appears stable.  - Continue amiodarone gtt.  - Check Mg this morning.  6. PAF s/p ablation 04/2012: Device interrogation showed A fib since May 16th. Pacing turned down this morning, now afib in 70s.  7. Anemia: Post-op.  Will give 1 unit PRBCs today.   CRITICAL CARE Performed by: Loralie Champagne  Total critical care time: 40 minutes  Critical care time was exclusive of separately billable procedures and treating other patients.  Critical care was necessary to treat or prevent imminent or life-threatening deterioration.  Critical care was  time spent personally by me on the following activities: development of treatment plan with patient and/or surrogate as well as nursing, discussions with consultants, evaluation of patient's response to treatment, examination of patient, obtaining history from patient or surrogate, ordering and performing treatments and interventions, ordering and review of laboratory studies, ordering and review of radiographic studies, pulse oximetry and re-evaluation of patient's condition. Length of Stay: [place], MD  08/21/2017, 8:16 AM  Advanced Heart Failure Team Pager [phone] (M-F; 7a - 4p)  Please contact [facility] for night-coverage after hours (4p -7a ) and weekends on [email or web address]

## 2017-08-21 NOTE — Progress Notes (Signed)
ANTICOAGULATION CONSULT NOTE  Pharmacy Consult for Heparin Indication: LVAD  Patient Measurements: Height: 5' 10.98" (180.3 cm) Weight: 259 lb 7.7 oz (117.7 kg) IBW/kg (Calculated) : 75.26 Heparin Dosing Weight: 98.3 kg  Vital Signs: Temp: 99.5 F (37.5 C) (06/22 0300) Temp Source: Core (06/21 1900) BP: 97/57 (06/22 0325) Pulse Rate: 90 (06/22 0325)  Labs: Recent Labs    08/18/17 1717  08/19/17 0443  08/19/17 2005 08/20/17 0429 08/20/17 1221 08/20/17 1550 08/20/17 2002 08/21/17 0335 08/21/17 0346  HGB  --   --  8.6*   < > 9.5* 8.3*  --   --   --  7.9*  --   HCT  --   --  27.0*   < > 28.0* 26.4*  --   --   --  24.7*  --   PLT  --   --  115*  --   --  134*  --   --   --  147*  --   APTT 78*  --  95*  --   --   --   --   --   --   --   --   LABPROT  --   --  18.6*  --   --  16.8*  --   --   --  17.2*  --   INR  --   --  1.57  --   --  1.37  --   --   --  1.42  --   HEPARINUNFRC  --    < > 0.18*  --   --   --  0.17*  --  0.19*  --  0.32  CREATININE  --   --  0.89   < > 0.60* 0.86  --  0.83  --  0.93  --    < > = values in this interval not displayed.     Medical History: Past Medical History:  Diagnosis Date  . AICD (automatic cardioverter/defibrillator) present 02/05/2014   Upgrade to Medtronic biventricular ICD, serial number  BLD [id number]   . Atrial flutter ([place]) 04/2012   s/p TEE-EPS+RFCA 04/2012  . CAD (coronary artery disease) 2482,5003 X 2    RCA-T, 70% PL (off CFX), 99% Prox LAD/90% Dist LAD, S/P TAXUS stent x 2  . CHF (congestive heart failure) ([place])   . Chronic anticoagulation   . Chronic systolic heart failure ([place])   . CKD (chronic kidney disease)   . Diabetic retinopathy ([place])   . DM type 2 (diabetes mellitus, type 2) (HCC)    insulin dependent  . HTN (hypertension)   . Hypercholesteremia    ablation  . ICD (implantable cardiac defibrillator) in place   . Ischemic cardiomyopathy March 2015   20-25% 2D   . Nephrolithiasis   . Ventricular  tachycardia ([place])     Assessment: 15 yom who underwent LVAD placement on 6/17.  Underwent sternal closure yesterday on 6/20, on heparin, warfarin re-started 6/21 as well  Heparin level this AM is therapeutic x 1 after rate increase   Goal of Therapy:  Heparin level 0.3-0.5 units/ml Monitor platelets by anticoagulation protocol: Yes   Plan:  Cont heparin at 1200 units/hr Max heparin rate 1600 units/hr for this patient 1200 heparin level Monitor CBC, HL, and s/sx of bleeding  Narda Bonds, PharmD, BCPS Clinical Pharmacist Phone: [phone]

## 2017-08-21 NOTE — Progress Notes (Signed)
[facility] for heparin/Coumadin Indication: LVAD  Allergies  Allergen Reactions  . Penicillins Hives, Itching and Other (See Comments)    Has patient had a PCN reaction causing immediate rash, facial/tongue/throat swelling, SOB or lightheadedness with hypotension:# # Yes # # Has patient had a PCN reaction causing severe rash involving mucus membranes or skin necrosis: No Has patient had a PCN reaction that required hospitalization: No Has patient had a PCN reaction occurring within the last 10 years: No If all of the above answers are "NO", then may proceed with Cephalosporin use.     Patient Measurements: Height: 5' 10.98" (180.3 cm) Weight: 262 lb 12.6 oz (119.2 kg) IBW/kg (Calculated) : 75.26 Heparin Dosing Weight: 98.3 kg  Vital Signs: Temp: 99.7 F (37.6 C) (06/22 1345) Temp Source: Core (06/22 1300) BP: 93/73 (06/22 1300) Pulse Rate: 81 (06/22 1345)  Labs: Recent Labs    08/18/17 1717 08/19/17 0443  08/19/17 2005 08/20/17 0429  08/20/17 1550 08/20/17 2002 08/21/17 0335 08/21/17 0346 08/21/17 1244  HGB  --  8.6*   < > 9.5* 8.3*  --   --   --  7.9*  --   --   HCT  --  27.0*   < > 28.0* 26.4*  --   --   --  24.7*  --   --   PLT  --  115*  --   --  134*  --   --   --  147*  --   --   APTT 78* 95*  --   --   --   --   --   --   --   --   --   LABPROT  --  18.6*  --   --  16.8*  --   --   --  17.2*  --   --   INR  --  1.57  --   --  1.37  --   --   --  1.42  --   --   HEPARINUNFRC  --  0.18*  --   --   --    < >  --  0.19*  --  0.32 0.34  CREATININE  --  0.89   < > 0.60* 0.86  --  0.83  --  0.93  --   --    < > = values in this interval not displayed.    Estimated Creatinine Clearance: 101.3 mL/min (by C-G formula based on SCr of 0.93 mg/dL).   Medical History: Past Medical History:  Diagnosis Date  . AICD (automatic cardioverter/defibrillator) present 02/05/2014   Upgrade to Medtronic biventricular ICD, serial number  BLD  [id number]   . Atrial flutter ([place]) 04/2012   s/p TEE-EPS+RFCA 04/2012  . CAD (coronary artery disease) 7106,2694 X 2    RCA-T, 70% PL (off CFX), 99% Prox LAD/90% Dist LAD, S/P TAXUS stent x 2  . CHF (congestive heart failure) ([place])   . Chronic anticoagulation   . Chronic systolic heart failure ([place])   . CKD (chronic kidney disease)   . Diabetic retinopathy ([place])   . DM type 2 (diabetes mellitus, type 2) (HCC)    insulin dependent  . HTN (hypertension)   . Hypercholesteremia    ablation  . ICD (implantable cardiac defibrillator) in place   . Ischemic cardiomyopathy March 2015   20-25% 2D   . Nephrolithiasis   . Ventricular tachycardia (HCC)     Medications:  Scheduled:  .  acetaminophen  1,000 mg Oral Q6H   Or  . acetaminophen (TYLENOL) oral liquid 160 mg/5 mL  1,000 mg Per Tube Q6H  . aspirin EC  325 mg Oral Daily   Or  . aspirin  324 mg Per Tube Daily   Or  . aspirin  300 mg Rectal Daily  . bisacodyl  10 mg Oral Daily   Or  . bisacodyl  10 mg Rectal Daily  . chlorhexidine gluconate (MEDLINE KIT)  15 mL Mouth Rinse BID  . Chlorhexidine Gluconate Cloth  6 each Topical Daily  . feeding supplement (PRO-STAT SUGAR FREE 64)  30 mL Per Tube BID  . insulin aspart  0-24 Units Subcutaneous Q4H  . insulin detemir  20 Units Subcutaneous BID  . levalbuterol  0.63 mg Nebulization TID  . mouth rinse  15 mL Mouth Rinse 10 times per day  . metoCLOPramide (REGLAN) injection  10 mg Intravenous Q6H  . rifampin  600 mg Oral Once  . sodium chloride flush  10-40 mL Intracatheter Q12H  . warfarin  5 mg Oral ONCE-1800  . Warfarin - Pharmacist Dosing Inpatient   Does not apply q1800    Assessment: 53 yom who underwent LVAD placement on 6/17. Heparin infusion has been running at 900 units/hr per physician. Underwent sternal closure yesterday on 6/20.  Heparin level at goal this morning, confirmatory level remains at goal this afternoon.  Pharmacy dosing Coumadin, had first dose of 5 mg last  night.  INR with slight increase today.  Goal of Therapy:  Heparin level 0.3-0.5 units/ml Monitor platelets by anticoagulation protocol: Yes   Plan:  Continue IV heparin at current rate. Warfarin 5 mg again tonight. Monitor CBC, heparin level, INR, and s/sx of bleeding  Marguerite Olea, [facility] Clinical Pharmacist Phone [phone]  08/21/2017 1:47 PM

## 2017-08-21 NOTE — Progress Notes (Signed)
   08/21/17 0800  Clinical Encounter Type  Visited With Patient;Health care provider  Visit Type Code  Referral From Nurse  Consult/Referral To Chaplain   Responded to a code blue for this patient.  No family was present at the time.  Patient is continuing to be monitored by the medical staff.  No other needs identified at this time.  Will follow and support as needed. Chaplain Katherene Ponto

## 2017-08-22 ENCOUNTER — Inpatient Hospital Stay (HOSPITAL_COMMUNITY): Payer: PPO

## 2017-08-22 DIAGNOSIS — I472 Ventricular tachycardia: Secondary | ICD-10-CM

## 2017-08-22 LAB — BLOOD GAS, ARTERIAL
Acid-base deficit: 1.4 mmol/L (ref 0.0–2.0)
Bicarbonate: 23.5 mmol/L (ref 20.0–28.0)
Drawn by: 252031
FIO2: 40
MECHVT: 600 mL
Nitric Oxide: 10
O2 Saturation: 95.5 %
PEEP: 5 cmH2O
Patient temperature: 98.6
Pressure support: 10 cmH2O
RATE: 16 resp/min
pCO2 arterial: 44.4 mmHg (ref 32.0–48.0)
pH, Arterial: 7.343 — ABNORMAL LOW (ref 7.350–7.450)
pO2, Arterial: 79.4 mmHg — ABNORMAL LOW (ref 83.0–108.0)

## 2017-08-22 LAB — CBC WITH DIFFERENTIAL/PLATELET
Abs Immature Granulocytes: 0.1 10*3/uL (ref 0.0–0.1)
Basophils Absolute: 0.1 10*3/uL (ref 0.0–0.1)
Basophils Relative: 1 %
Eosinophils Absolute: 0.1 10*3/uL (ref 0.0–0.7)
Eosinophils Relative: 1 %
HCT: 26.4 % — ABNORMAL LOW (ref 39.0–52.0)
Hemoglobin: 8.4 g/dL — ABNORMAL LOW (ref 13.0–17.0)
Immature Granulocytes: 1 %
Lymphocytes Relative: 9 %
Lymphs Abs: 0.8 10*3/uL (ref 0.7–4.0)
MCH: 27 pg (ref 26.0–34.0)
MCHC: 31.8 g/dL (ref 30.0–36.0)
MCV: 84.9 fL (ref 78.0–100.0)
Monocytes Absolute: 1.3 10*3/uL — ABNORMAL HIGH (ref 0.1–1.0)
Monocytes Relative: 15 %
Neutro Abs: 6.5 10*3/uL (ref 1.7–7.7)
Neutrophils Relative %: 73 %
Platelets: 194 10*3/uL (ref 150–400)
RBC: 3.11 MIL/uL — ABNORMAL LOW (ref 4.22–5.81)
RDW: 15.9 % — ABNORMAL HIGH (ref 11.5–15.5)
WBC: 8.8 10*3/uL (ref 4.0–10.5)

## 2017-08-22 LAB — COMPREHENSIVE METABOLIC PANEL
ALT: 18 U/L (ref 17–63)
AST: 48 U/L — ABNORMAL HIGH (ref 15–41)
Albumin: 2.3 g/dL — ABNORMAL LOW (ref 3.5–5.0)
Alkaline Phosphatase: 58 U/L (ref 38–126)
Anion gap: 9 (ref 5–15)
BUN: 18 mg/dL (ref 6–20)
CO2: 22 mmol/L (ref 22–32)
Calcium: 8 mg/dL — ABNORMAL LOW (ref 8.9–10.3)
Chloride: 102 mmol/L (ref 101–111)
Creatinine, Ser: 0.98 mg/dL (ref 0.61–1.24)
GFR calc Af Amer: >60 mL/min (ref >60)
GFR calc non Af Amer: >60 mL/min (ref >60)
Glucose, Bld: 232 mg/dL — ABNORMAL HIGH (ref 65–99)
Potassium: 4 mmol/L (ref 3.5–5.1)
Sodium: 133 mmol/L — ABNORMAL LOW (ref 135–145)
Total Bilirubin: 1.2 mg/dL (ref 0.3–1.2)
Total Protein: 6 g/dL — ABNORMAL LOW (ref 6.5–8.1)

## 2017-08-22 LAB — GLUCOSE, CAPILLARY
GLUCOSE-CAPILLARY: 222 mg/dL — AB (ref 65–99)
GLUCOSE-CAPILLARY: 241 mg/dL — AB (ref 65–99)
GLUCOSE-CAPILLARY: 215 mg/dL — AB (ref 65–99)
GLUCOSE-CAPILLARY: 219 mg/dL — AB (ref 65–99)
Glucose-Capillary: 218 mg/dL — ABNORMAL HIGH (ref 65–99)
Glucose-Capillary: 244 mg/dL — ABNORMAL HIGH (ref 65–99)
Glucose-Capillary: 250 mg/dL — ABNORMAL HIGH (ref 65–99)

## 2017-08-22 LAB — COOXEMETRY PANEL
Carboxyhemoglobin: 1.6 % — ABNORMAL HIGH (ref 0.5–1.5)
Carboxyhemoglobin: 1.5 % (ref 0.5–1.5)
Methemoglobin: 1 % (ref 0.0–1.5)
Methemoglobin: 0.8 % (ref 0.0–1.5)
O2 Saturation: 56 %
O2 Saturation: 52.2 %
Total hemoglobin: 11.8 g/dL — ABNORMAL LOW (ref 12.0–16.0)
Total hemoglobin: 12.1 g/dL (ref 12.0–16.0)

## 2017-08-22 LAB — PROTIME-INR
INR: 1.62
Prothrombin Time: 19.1 seconds — ABNORMAL HIGH (ref 11.4–15.2)

## 2017-08-22 LAB — HEPARIN LEVEL (UNFRACTIONATED)
HEPARIN UNFRACTIONATED: 0.35 [IU]/mL (ref 0.30–0.70)
Heparin Unfractionated: 0.27 IU/mL — ABNORMAL LOW (ref 0.30–0.70)

## 2017-08-22 LAB — LACTATE DEHYDROGENASE: LDH: 274 U/L — ABNORMAL HIGH (ref 98–192)

## 2017-08-22 LAB — PHOSPHORUS: Phosphorus: 2.4 mg/dL — ABNORMAL LOW (ref 2.5–4.6)

## 2017-08-22 LAB — MAGNESIUM: Magnesium: 2 mg/dL (ref 1.7–2.4)

## 2017-08-22 MED ORDER — WARFARIN SODIUM 7.5 MG PO TABS
7.5000 mg | ORAL_TABLET | Freq: Once | ORAL | Status: AC
  Administered 2017-08-22: 7.5 mg via ORAL
  Filled 2017-08-22: qty 1

## 2017-08-22 MED ORDER — FUROSEMIDE 10 MG/ML IJ SOLN
80.0000 mg | Freq: Once | INTRAMUSCULAR | Status: AC
  Administered 2017-08-22: 80 mg via INTRAVENOUS
  Filled 2017-08-22: qty 8

## 2017-08-22 MED ORDER — HYDRALAZINE HCL 20 MG/ML IJ SOLN
10.0000 mg | INTRAMUSCULAR | Status: DC | PRN
  Administered 2017-08-22 – 2017-09-01 (×4): 10 mg via INTRAVENOUS
  Filled 2017-08-22 (×4): qty 1

## 2017-08-22 MED ORDER — METOLAZONE 2.5 MG PO TABS
2.5000 mg | ORAL_TABLET | Freq: Once | ORAL | Status: AC
  Administered 2017-08-22: 2.5 mg via ORAL
  Filled 2017-08-22: qty 1

## 2017-08-22 MED ORDER — ACETAMINOPHEN 325 MG PO TABS
650.0000 mg | ORAL_TABLET | ORAL | Status: DC | PRN
  Administered 2017-08-22 – 2017-08-24 (×6): 650 mg
  Filled 2017-08-22 (×6): qty 2

## 2017-08-22 MED ORDER — POTASSIUM CHLORIDE 20 MEQ/15ML (10%) PO SOLN
40.0000 meq | Freq: Once | ORAL | Status: AC
  Administered 2017-08-22: 40 meq via ORAL
  Filled 2017-08-22: qty 30

## 2017-08-22 NOTE — Progress Notes (Signed)
Patient ID: [name] [name], male   DOB: 07/15/1950, 67 y.o.   MRN: [id number] HeartMate 3 Rounding Note  Subjective:    Remains sedated on vent  Hemodynamics have been fairly stable with some HTN.  PA 50/23 CVP 14-18 CI 2.5 Co-ox 56%  On milrinone 0.5, epi 4.  Diuresing well now on lasix 12 and metolazone 2.5 this am.  LVAD INTERROGATION:  HeartMate IIl LVAD:  Flow 3.6 liters/min, speed 5200, power 4.0, PI 4.0.     Objective:    Vital Signs:   Temp:  [99.5 F (37.5 C)-100.9 F (38.3 C)] 100.8 F (38.2 C) (06/23 1200) Pulse Rate:  [43-113] 95 (06/23 1200) Resp:  [12-36] 19 (06/23 1200) BP: (65-136)/(53-96) 106/73 (06/23 1200) SpO2:  [96 %-100 %] 98 % (06/23 1200) Arterial Line BP: (70-168)/(49-94) 99/63 (06/23 1200) FiO2 (%):  [40 %-50 %] 40 % (06/23 1200) Weight:  [123 kg (271 lb 2.7 oz)] 123 kg (271 lb 2.7 oz) (06/23 0400) Last BM Date: 08/22/17 Mean arterial Pressure 80's  Intake/Output:   Intake/Output Summary (Last 24 hours) at 08/22/2017 1211 Last data filed at 08/22/2017 1200 Gross per 24 hour  Intake 5999.32 ml  Output 3731 ml  Net 2268.32 ml     Physical Exam: General:  Intubated and sedated on vent, eyes open. HEENT: normal Cor: Distant irregular heart sounds with LVAD hum present. Lungs: coarse bilat Abdomen: soft, nontender, mildly distended. No hepatosplenomegaly. No bruits or masses. Good bowel sounds. driveline dressing intact with anchor. Extremities: anasarca Neuro: alert & orientedx3, cranial nerves grossly intact. moves all 4 extremities w/o difficulty. Affect pleasant  Telemetry: atrial fib 90's on amio drip  Labs: Basic Metabolic Panel: Recent Labs  Lab 08/18/17 0302  08/19/17 0443  08/20/17 0429 08/20/17 1550 08/21/17 0335 08/21/17 1116 08/21/17 1541 08/22/17 0406  NA 134*   < > 135   < > 134* 133* 132*  --  132* 133*  K 4.0   < > 3.3*   < > 3.9 3.7 3.7  --  3.8 4.0  CL 106   < > 104   < > 102 102 100*  --  101 102  CO2 21*   --  24  --  '24 25 23  ' --  23 22  GLUCOSE 122*   < > 160*   < > 197* 217* 238*  --  291* 232*  BUN 7   < > 7   < > 5* 8 11  --  13 18  CREATININE 0.88   < > 0.89   < > 0.86 0.83 0.93  --  0.97 0.98  CALCIUM 8.6*  --  8.4*  --  8.3* 8.2* 8.2*  --  8.0* 8.0*  MG 1.6*  --  1.5*  --  1.6*  --   --  1.9  --  2.0  PHOS 2.5  --  2.9  --  3.0  --  2.7  --   --  2.4*   < > = values in this interval not displayed.    Liver Function Tests: Recent Labs  Lab 08/18/17 0302 08/19/17 0443 08/20/17 0429 08/21/17 0335 08/22/17 0406  AST 73* 48* 53* 45* 48*  ALT 16* 16* 16* 17 18  ALKPHOS 78 49 46 47 58  BILITOT 1.8* 1.6* 1.9* 1.3* 1.2  PROT 5.5* 5.7* 5.7* 6.1* 6.0*  ALBUMIN 3.0* 2.6* 2.7* 2.4* 2.3*   No results for input(s): LIPASE, AMYLASE in the last 168 hours. No  results for input(s): AMMONIA in the last 168 hours.  CBC: Recent Labs  Lab 08/18/17 0302  08/19/17 0443  08/19/17 2005 08/20/17 0429 08/21/17 0335 08/21/17 1957 08/22/17 0406  WBC 9.4  --  10.0  --   --  9.1 7.8 8.5 8.8  NEUTROABS 7.4  --  8.0*  --   --  7.7 6.0  --  6.5  HGB 8.6*   < > 8.6*   < > 9.5* 8.3* 7.9* 8.3* 8.4*  HCT 27.0*   < > 27.0*   < > 28.0* 26.4* 24.7* 26.4* 26.4*  MCV 86.0  --  85.4  --   --  85.2 83.7 85.2 84.9  PLT 96*  --  115*  --   --  134* 147* 178 194   < > = values in this interval not displayed.    INR: Recent Labs  Lab 08/18/17 0302 08/19/17 0443 08/20/17 0429 08/21/17 0335 08/22/17 0406  INR 1.92 1.57 1.37 1.42 1.62    Other results:  EKG:   Imaging: Dg Chest Port 1 View  Result Date: 08/22/2017 CLINICAL DATA:  LVAD. EXAM: PORTABLE CHEST 1 VIEW COMPARISON:  08/21/2017 FINDINGS: LVAD unchanged. Multiple tubes and lines without significant change including endotracheal tube, nasogastric tube, right IJ Swan-Ganz catheter, right IJ central venous catheter, mediastinal drain and left chest tube. A second enteric tube is also unchanged. Lungs are somewhat hypoinflated demonstrate  slight interval worsening of hazy perihilar opacification likely interstitial edema. Suggestion of small amount of bilateral pleural fluid likely with associated basilar atelectasis. Stable cardiomegaly. No pneumothorax. Remainder the exam is unchanged. IMPRESSION: Slight interval worsening of findings likely representing interstitial edema. Suggestion of small amount of bilateral pleural fluid likely with associated basilar atelectasis Stable cardiomegaly. Multiple tubes and lines as described unchanged. Electronically Signed   By: Marin Olp M.D.   On: 08/22/2017 07:12   Dg Chest Port 1 View  Result Date: 08/21/2017 CLINICAL DATA:  Left ventricular assist device present. EXAM: PORTABLE CHEST 1 VIEW COMPARISON:  08/20/2017 FINDINGS: Endotracheal tube terminates 3.5 cm above the carina. Right jugular Swan-Ganz catheter terminates over the main pulmonary artery. An additional right jugular catheter remains in place. A small caliber enteric tube and a new feeding tube both course into the left upper abdomen with tips not imaged. An ICD and left ventricular assist device are unchanged in appearance. A mediastinal drain and left chest tube remain. Pulmonary edema and left basilar atelectasis are similar to the prior study. No large pleural effusion or pneumothorax is identified. IMPRESSION: Unchanged appearance of the chest including mild pulmonary edema and basilar atelectasis. Electronically Signed   By: Logan Bores M.D.   On: 08/21/2017 08:08   Dg Abd Portable 1v  Result Date: 08/20/2017 CLINICAL DATA:  Feeding tube placement EXAM: PORTABLE ABDOMEN - 1 VIEW COMPARISON:  None. FINDINGS: Feeding tube tip is at the level of the first portion of the duodenum. There is no bowel dilatation or air-fluid level to suggest bowel obstruction. No free air. Left ventricular assist device present. IMPRESSION: Feeding tube tip at first portion of duodenum. No bowel obstruction or free air evident. Electronically Signed    By: Lowella Grip III M.D.   On: 08/20/2017 13:03      Medications:     Scheduled Medications: . aspirin EC  325 mg Oral Daily   Or  . aspirin  324 mg Per Tube Daily   Or  . aspirin  300 mg Rectal Daily  .  bisacodyl  10 mg Oral Daily   Or  . bisacodyl  10 mg Rectal Daily  . chlorhexidine gluconate (MEDLINE KIT)  15 mL Mouth Rinse BID  . Chlorhexidine Gluconate Cloth  6 each Topical Daily  . feeding supplement (PRO-STAT SUGAR FREE 64)  30 mL Per Tube BID  . insulin aspart  0-24 Units Subcutaneous Q4H  . insulin detemir  30 Units Subcutaneous BID  . levalbuterol  0.63 mg Nebulization TID  . mouth rinse  15 mL Mouth Rinse 10 times per day  . metoCLOPramide (REGLAN) injection  10 mg Intravenous Q6H  . rifampin  600 mg Oral Once  . sodium chloride flush  10-40 mL Intracatheter Q12H  . warfarin  7.5 mg Oral ONCE-1800  . Warfarin - Pharmacist Dosing Inpatient   Does not apply q1800     Infusions: . sodium chloride 10 mL/hr at 08/22/17 1100  . sodium chloride    . sodium chloride 10 mL/hr at 08/21/17 0200  . sodium chloride 10 mL/hr at 08/22/17 1100  . amiodarone 30 mg/hr (08/22/17 1100)  . dexmedetomidine (PRECEDEX) IV infusion 1.2 mcg/kg/hr (08/22/17 1124)  . EPINEPHrine 4 mg in dextrose 5% 250 mL infusion (16 mcg/mL) 2 mcg/min (08/22/17 1100)  . famotidine (PEPCID) IV Stopped (08/22/17 1037)  . feeding supplement (VITAL HIGH PROTEIN) 1,000 mL (08/22/17 0415)  . fentaNYL infusion INTRAVENOUS 400 mcg/hr (08/22/17 1100)  . furosemide (LASIX) infusion 12 mg/hr (08/22/17 1100)  . heparin 1,350 Units/hr (08/22/17 1100)  . lactated ringers Stopped (08/18/17 0933)  . lactated ringers 10 mL/hr at 08/22/17 1100  . lactated ringers Stopped (08/19/17 1602)  . levofloxacin (LEVAQUIN) IV 100 mL/hr at 08/21/17 2200  . milrinone 0.5 mcg/kg/min (08/22/17 1100)  . 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 Sternum initially left open and closed 08/19/17. Hemodynamics have been fairly stable on current support with some HTN at times with agitation. Had VT yesterday and amiodarone started.  Marked volume overload with almost 5L positive over the past two days. CXR shows worsening pulmonary edema. He is not ready for vent wean yet. Lasix drip increased to 12 and give bolus of 80 mg and 2.5 mg Metolazone this am by DB and is now diuresing well. Need to continue brisk diuresis. Replace K+.  Fever to 100.9 without leukocytosis. He is at high risk for sepsis given his course and CXR appearance with pulmonary edema could easily evolve into pneumonia. Will start Levaquin empirically since he is PCN allergic. Hold off on vanc for now.  Coumadin and Heparin per pharmacy.  Tube feeds started.  Discussed status and plans with his wife at bedside.  I reviewed the LVAD parameters from today, and compared the results to the patient's prior recorded data.  No programming changes were made.  The LVAD is functioning within specified parameters.   LVAD interrogation was negative for any significant power changes, alarms or PI events/speed drops.  LVAD equipment check completed and is in good working order.  Back-up equipment present.   LVAD education done on emergency procedures and precautions and reviewed exit site care.  Length of Stay: [address]  Fernande Boyden [facility] 08/22/2017, 12:11 PM

## 2017-08-22 NOTE — Progress Notes (Signed)
VAD drive line site care:  Vad dressing change performed using sterile technique and supplies.  Drive line insertion site unremarkable.  Small amount of sanguinous drainage.  No signs of redness swelling or infection.  No migration or fracture of the drive line cord.  Silver strip placed and new dressing CDI.  Anchor secure and applied correctly.  Bobette Mo

## 2017-08-22 NOTE — Progress Notes (Signed)
[facility] for heparin/Coumadin Indication: LVAD  Allergies  Allergen Reactions  . Penicillins Hives, Itching and Other (See Comments)    Has patient had a PCN reaction causing immediate rash, facial/tongue/throat swelling, SOB or lightheadedness with hypotension:# # Yes # # Has patient had a PCN reaction causing severe rash involving mucus membranes or skin necrosis: No Has patient had a PCN reaction that required hospitalization: No Has patient had a PCN reaction occurring within the last 10 years: No If all of the above answers are "NO", then may proceed with Cephalosporin use.     Patient Measurements: Height: 5' 10.98" (180.3 cm) Weight: 271 lb 2.7 oz (123 kg) IBW/kg (Calculated) : 75.26 Heparin Dosing Weight: 98.3 kg  Vital Signs: Temp: 100.8 F (38.2 C) (06/23 1730) Temp Source: Core (06/23 1700) BP: 92/51 (06/23 1700) Pulse Rate: 71 (06/23 1730)  Labs: Recent Labs    08/20/17 0429  08/21/17 0335  08/21/17 1244 08/21/17 1541 08/21/17 1957 08/22/17 0406 08/22/17 1644  HGB 8.3*  --  7.9*  --   --   --  8.3* 8.4*  --   HCT 26.4*  --  24.7*  --   --   --  26.4* 26.4*  --   PLT 134*  --  147*  --   --   --  178 194  --   LABPROT 16.8*  --  17.2*  --   --   --   --  19.1*  --   INR 1.37  --  1.42  --   --   --   --  1.62  --   HEPARINUNFRC  --    < >  --    < > 0.34  --   --  0.27* 0.35  CREATININE 0.86   < > 0.93  --   --  0.97  --  0.98  --    < > = values in this interval not displayed.    Estimated Creatinine Clearance: 97.7 mL/min (by C-G formula based on SCr of 0.98 mg/dL).   Medical History: Past Medical History:  Diagnosis Date  . AICD (automatic cardioverter/defibrillator) present 02/05/2014   Upgrade to Medtronic biventricular ICD, serial number  BLD [id number]   . Atrial flutter ([place]) 04/2012   s/p TEE-EPS+RFCA 04/2012  . CAD (coronary artery disease) 0102,7253 X 2    RCA-T, 70% PL (off CFX), 99% Prox LAD/90% Dist  LAD, S/P TAXUS stent x 2  . CHF (congestive heart failure) ([place])   . Chronic anticoagulation   . Chronic systolic heart failure ([place])   . CKD (chronic kidney disease)   . Diabetic retinopathy ([place])   . DM type 2 (diabetes mellitus, type 2) ([facility])    insulin dependent  . HTN (hypertension)   . Hypercholesteremia    ablation  . ICD (implantable cardiac defibrillator) in place   . Ischemic cardiomyopathy March 2015   20-25% 2D   . Nephrolithiasis   . Ventricular tachycardia ([facility])     Medications:  Scheduled:  . aspirin EC  325 mg Oral Daily   Or  . aspirin  324 mg Per Tube Daily   Or  . aspirin  300 mg Rectal Daily  . bisacodyl  10 mg Oral Daily   Or  . bisacodyl  10 mg Rectal Daily  . chlorhexidine gluconate (MEDLINE KIT)  15 mL Mouth Rinse BID  . Chlorhexidine Gluconate Cloth  6 each Topical Daily  . feeding  supplement (PRO-STAT SUGAR FREE 64)  30 mL Per Tube BID  . insulin aspart  0-24 Units Subcutaneous Q4H  . insulin detemir  30 Units Subcutaneous BID  . levalbuterol  0.63 mg Nebulization TID  . mouth rinse  15 mL Mouth Rinse 10 times per day  . metoCLOPramide (REGLAN) injection  10 mg Intravenous Q6H  . rifampin  600 mg Oral Once  . sodium chloride flush  10-40 mL Intracatheter Q12H  . warfarin  7.5 mg Oral ONCE-1800  . Warfarin - Pharmacist Dosing Inpatient   Does not apply q1800    Assessment: 70 yom who underwent LVAD placement on 6/17. Heparin infusion has been running at 900 units/hr per physician. Underwent sternal closure yesterday on 6/20.  Heparin level slightly below goal this AM.  No overt bleeding or complications noted. Heparin level after rate increase is therapeutic at 0.35.  Pharmacy dosing Coumadin, had first dose of 5 mg last night.  INR with slight increase today.  Goal of Therapy:  Heparin level 0.3-0.5 units/ml Monitor platelets by anticoagulation protocol: Yes   Plan:  Continue IV Heparin to 1350 units/hr. Monitor CBC, heparin level, INR,  and s/sx of bleeding  Elenor Quinones, PharmD, BCPS Clinical Pharmacist Phone number [phone] 08/22/2017 5:34 PM

## 2017-08-22 NOTE — Progress Notes (Signed)
Patient ID: [name] [name], male   DOB: 1950/08/08, 67 y.o.   MRN: [id number]     Advanced Heart Failure Rounding Note  PCP-Cardiologist: No primary care provider on file.   Subjective:    Events 6/17 Underwent HM-3 implant -> Unable to close chest with RV failure and high intrathoracic pressure 6/18 Taken back to OR for evacuation of hematoma. NO RVAD needed. Left sternum open  08/19/17 Taken back to OR for sternal closure 6/22 VT => amiodarone started.   Drips Milrinone 0.5 Epi 4 NE off Heparin  Lasix 10 mg/hr Amio 30 NTG gtt at 25  Remains intubated/sedated. On high-dose fentanyl and precedex but still agitated at times. IV NTG started overnight for labile MAPs. VT quiet overnight. Sluggish diuresis. Weight up 9 pounds. CVP 18.   Swan CVP 18 PA 52/22 CO 5.3 CI 2.3 Co-ox 56%  LVAD Interrogation HM 3: Speed: 5200 Flow: 3.6 PI: 4.0 Power: 4.0  VAD interrogated personally. Parameters stable.   Objective:   Weight Range: 123 kg (271 lb 2.7 oz) Body mass index is 37.84 kg/m.   Vital Signs:   Temp:  [99.5 F (37.5 C)-100.8 F (38.2 C)] 100.8 F (38.2 C) (06/23 1000) Pulse Rate:  [43-97] 72 (06/23 1000) Resp:  [12-36] 19 (06/23 1000) BP: (65-136)/(53-96) 129/92 (06/23 1000) SpO2:  [96 %-100 %] 98 % (06/23 1000) Arterial Line BP: (70-168)/(49-94) 137/77 (06/23 1000) FiO2 (%):  [40 %-50 %] 40 % (06/23 0825) Weight:  [123 kg (271 lb 2.7 oz)] 123 kg (271 lb 2.7 oz) (06/23 0400) Last BM Date: 08/22/17  Weight change: Filed Weights   08/20/17 0500 08/21/17 0600 08/22/17 0400  Weight: 117.7 kg (259 lb 7.7 oz) 119.2 kg (262 lb 12.6 oz) 123 kg (271 lb 2.7 oz)    Intake/Output:   Intake/Output Summary (Last 24 hours) at 08/22/2017 1023 Last data filed at 08/22/2017 1000 Gross per 24 hour  Intake 6121.24 ml  Output 3406 ml  Net 2715.24 ml      Physical Exam    General:  Intubated/sedated HEENT: normal  + ETT Neck: supple. +IJ swan.  Carotids 2+ bilat; no  bruits. No lymphadenopathy or thryomegaly appreciated. Cor: sternal dressing in place LVAD hum.  Lungs: Coarse Abdomen: obese soft, nontender, + distended. No hepatosplenomegaly. No bruits or masses. Good bowel sounds. Driveline site clean. Anchor in place.  Extremities: no cyanosis, clubbing, rash. 3+ edema into thighs Neuro: intubated /sedated   Telemetry   V paced 90s => VT, personally reviewed.   Labs    CBC Recent Labs    08/21/17 0335 08/21/17 1957 08/22/17 0406  WBC 7.8 8.5 8.8  NEUTROABS 6.0  --  6.5  HGB 7.9* 8.3* 8.4*  HCT 24.7* 26.4* 26.4*  MCV 83.7 85.2 84.9  PLT 147* 178 324   Basic Metabolic Panel Recent Labs    08/21/17 0335 08/21/17 1116 08/21/17 1541 08/22/17 0406  NA 132*  --  132* 133*  K 3.7  --  3.8 4.0  CL 100*  --  101 102  CO2 23  --  23 22  GLUCOSE 238*  --  291* 232*  BUN 11  --  13 18  CREATININE 0.93  --  0.97 0.98  CALCIUM 8.2*  --  8.0* 8.0*  MG  --  1.9  --  2.0  PHOS 2.7  --   --  2.4*   Liver Function Tests Recent Labs    08/21/17 0335 08/22/17 0406  AST 45*  48*  ALT 17 18  ALKPHOS 47 58  BILITOT 1.3* 1.2  PROT 6.1* 6.0*  ALBUMIN 2.4* 2.3*   No results for input(s): LIPASE, AMYLASE in the last 72 hours. Cardiac Enzymes No results for input(s): CKTOTAL, CKMB, CKMBINDEX, TROPONINI in the last 72 hours.  BNP: BNP (last 3 results) Recent Labs    01/15/17 0911 08/11/17 1438 08/17/17 0314  BNP 159.6* 529.3* 266.4*    ProBNP (last 3 results) Recent Labs    11/12/16 1559  PROBNP 388.0*     D-Dimer No results for input(s): DDIMER in the last 72 hours. Hemoglobin A1C No results for input(s): HGBA1C in the last 72 hours. Fasting Lipid Panel No results for input(s): CHOL, HDL, LDLCALC, TRIG, CHOLHDL, LDLDIRECT in the last 72 hours. Thyroid Function Tests No results for input(s): TSH, T4TOTAL, T3FREE, THYROIDAB in the last 72 hours.  Invalid input(s): FREET3  Other results:   Imaging    Dg Chest Port 1  View  Result Date: 08/22/2017 CLINICAL DATA:  LVAD. EXAM: PORTABLE CHEST 1 VIEW COMPARISON:  08/21/2017 FINDINGS: LVAD unchanged. Multiple tubes and lines without significant change including endotracheal tube, nasogastric tube, right IJ Swan-Ganz catheter, right IJ central venous catheter, mediastinal drain and left chest tube. A second enteric tube is also unchanged. Lungs are somewhat hypoinflated demonstrate slight interval worsening of hazy perihilar opacification likely interstitial edema. Suggestion of small amount of bilateral pleural fluid likely with associated basilar atelectasis. Stable cardiomegaly. No pneumothorax. Remainder the exam is unchanged. IMPRESSION: Slight interval worsening of findings likely representing interstitial edema. Suggestion of small amount of bilateral pleural fluid likely with associated basilar atelectasis Stable cardiomegaly. Multiple tubes and lines as described unchanged. Electronically Signed   By: Marin Olp M.D.   On: 08/22/2017 07:12     Medications:     Scheduled Medications: . aspirin EC  325 mg Oral Daily   Or  . aspirin  324 mg Per Tube Daily   Or  . aspirin  300 mg Rectal Daily  . bisacodyl  10 mg Oral Daily   Or  . bisacodyl  10 mg Rectal Daily  . chlorhexidine gluconate (MEDLINE KIT)  15 mL Mouth Rinse BID  . Chlorhexidine Gluconate Cloth  6 each Topical Daily  . feeding supplement (PRO-STAT SUGAR FREE 64)  30 mL Per Tube BID  . furosemide  80 mg Intravenous Once  . insulin aspart  0-24 Units Subcutaneous Q4H  . insulin detemir  30 Units Subcutaneous BID  . levalbuterol  0.63 mg Nebulization TID  . mouth rinse  15 mL Mouth Rinse 10 times per day  . metoCLOPramide (REGLAN) injection  10 mg Intravenous Q6H  . metolazone  2.5 mg Oral Once  . rifampin  600 mg Oral Once  . sodium chloride flush  10-40 mL Intracatheter Q12H  . warfarin  7.5 mg Oral ONCE-1800  . Warfarin - Pharmacist Dosing Inpatient   Does not apply q1800     Infusions: . sodium chloride 10 mL/hr at 08/22/17 1000  . sodium chloride    . sodium chloride 10 mL/hr at 08/21/17 0200  . sodium chloride 10 mL/hr at 08/22/17 1000  . amiodarone 30 mg/hr (08/22/17 1000)  . dexmedetomidine (PRECEDEX) IV infusion 1.2 mcg/kg/hr (08/22/17 1000)  . EPINEPHrine 4 mg in dextrose 5% 250 mL infusion (16 mcg/mL) 2 mcg/min (08/22/17 1000)  . famotidine (PEPCID) IV 20 mg (08/22/17 1007)  . feeding supplement (VITAL HIGH PROTEIN) 1,000 mL (08/22/17 0415)  . fentaNYL infusion  INTRAVENOUS 300 mcg/hr (08/22/17 1000)  . furosemide (LASIX) infusion 10 mg/hr (08/22/17 1000)  . heparin 1,350 Units/hr (08/22/17 1000)  . lactated ringers Stopped (08/18/17 0933)  . lactated ringers 10 mL/hr at 08/22/17 1000  . lactated ringers Stopped (08/19/17 1602)  . levofloxacin (LEVAQUIN) IV 100 mL/hr at 08/21/17 2200  . milrinone 0.5 mcg/kg/min (08/22/17 1000)  . norepinephrine (LEVOPHED) Adult infusion Stopped (08/22/17 0617)    PRN Medications: sodium chloride, fentaNYL, hydrALAZINE, lactated ringers, midazolam, morphine injection, ondansetron (ZOFRAN) IV, oxyCODONE, sodium chloride flush    Patient Profile  MITCHEAL SWEETIN is a 67 y.o. 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   Directly admitted with persistent low cardiac output for milrinone initiation for home.  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 Acute/Chronic systolic CHF with biventricular failure-> cardiogenic shock: Echo  08/13/2017 EF 20-25%. s/p  Medtronic BiV ICD in place. Cath 12/18 with stable 1v CAD.  s/p HM-3 implant 6/17. Unable to close chest due to high-intrathoracic pressures and RV failure. Taken back to OR 08/17/17 with evacuation of hematoma with improvement.   - Remains intubated/sedated. On milrinone 0.5/EPI 4 co-ox 56% - Volume status much worse today  despite lasix gtt. CXR with worsening CHF. Unable to extubate currently - Give 80 IV lasix and metolazone 2.5. Increase lasix to 12/hr. Concentrate drips - MAPs labile with agitation. Stop NTG gtt. Use hydralazine PRN  - VAD interrogated personally. Parameters stable. On warfarin - VT quiescent. Was on sotalol pre-op. Now on IV amio. Keep K > 4.0 Mg > 2.0 2. VAD - s/p HM-3 implant 6/17 - VAD interrogated personally. Parameters stable. - warfarin started  - LDH 274 3. [name]: Recent cath with stable CAD as above - No change to current plan.   4. [name]: Recent A1c 8.3 on 6/3.  - Cover with SSI in house.  4. CKD II-III:  -Creatinine improved with hemodynamic support 5. [name]: Recurrent VT on 6/22 - Now quiescent. Continue amiodarone gtt.  - Keep K > 4.0 Mg > 2.0.  6. PAF s/p ablation 04/2012: Device interrogation showed A fib since May 16th. Pacing turned down this morning, now afib in 70s.  7. Anemia: Post-op.  Got 1 unit PRBCs 6/22. Hgb 8.4 today   CRITICAL CARE Performed by: Glori Bickers  Total critical care time: 40 minutes  Critical care time was exclusive of separately billable procedures and treating other patients.  Critical care was necessary to treat or prevent imminent or life-threatening deterioration.  Critical care was time spent personally by me on the following activities: development of treatment plan with patient and/or surrogate as well as nursing, discussions with consultants, evaluation of patient's response to treatment, examination of patient, obtaining history from patient or surrogate, ordering and performing treatments and interventions, ordering and review of laboratory studies, ordering and review of radiographic studies, pulse oximetry and re-evaluation of patient's condition. Length of Stay: 27  Glori Bickers, MD  08/22/2017, 10:23 AM  Advanced Heart Failure Team Pager [phone] (M-F; 7a - 4p)  Please contact [facility] for  night-coverage after hours (4p -7a ) and weekends on [email or web address]

## 2017-08-22 NOTE — Progress Notes (Signed)
OT Cancellation Note  Patient Details Name: [name] [name] MRN: [id number] DOB: April 26, 1950   Cancelled Treatment:    Reason Eval/Treat Not Completed: Patient not medically ready. Pt remains intubated, sedated and per chart therapies to follow up Monday.   Norman Herrlich, MS OTR/L  Pager: [phone]   Norman Herrlich 08/22/2017, 6:47 AM

## 2017-08-22 NOTE — Progress Notes (Signed)
Pharmacy Antibiotic Note  [name] [name] is a 67 y.o. male admitted on 08/11/2017 , with new VAD at risk for PNA.  Pharmacy has been consulted for levaquin dosing.  Levaquin already on board - spoke to Dr. Cyndia Bent, will continue at same dosage for now.  Plan: 1. Continue levaquin 750 mg q 24 hrs.  Current stop date of 6/26 in place.  Height: 5' 10.98" (180.3 cm) Weight: 271 lb 2.7 oz (123 kg) IBW/kg (Calculated) : 75.26  Temp (24hrs), Avg:100.2 F (37.9 C), Min:99.5 F (37.5 C), Max:100.9 F (38.3 C)  Recent Labs  Lab 08/19/17 0443  08/19/17 0734  08/20/17 0429 08/20/17 1550 08/21/17 0335 08/21/17 1541 08/21/17 1957 08/22/17 0406  WBC 10.0  --   --   --  9.1  --  7.8  --  8.5 8.8  CREATININE 0.89   < >  --    < > 0.86 0.83 0.93 0.97  --  0.98  VANCOTROUGH  --   --  12*  --   --   --   --   --   --   --    < > = values in this interval not displayed.    Estimated Creatinine Clearance: 97.7 mL/min (by C-G formula based on SCr of 0.98 mg/dL).    Allergies  Allergen Reactions  . Penicillins Hives, Itching and Other (See Comments)    Has patient had a PCN reaction causing immediate rash, facial/tongue/throat swelling, SOB or lightheadedness with hypotension:# # Yes # # Has patient had a PCN reaction causing severe rash involving mucus membranes or skin necrosis: No Has patient had a PCN reaction that required hospitalization: No Has patient had a PCN reaction occurring within the last 10 years: No If all of the above answers are "NO", then may proceed with Cephalosporin use.      Thank you for allowing pharmacy to be a part of this patient's care.  Marguerite Olea, [facility] Clinical Pharmacist Phone [phone]  08/22/2017 1:02 PM

## 2017-08-22 NOTE — Progress Notes (Signed)
[place] for heparin/Coumadin Indication: LVAD  Allergies  Allergen Reactions  . Penicillins Hives, Itching and Other (See Comments)    Has patient had a PCN reaction causing immediate rash, facial/tongue/throat swelling, SOB or lightheadedness with hypotension:# # Yes # # Has patient had a PCN reaction causing severe rash involving mucus membranes or skin necrosis: No Has patient had a PCN reaction that required hospitalization: No Has patient had a PCN reaction occurring within the last 10 years: No If all of the above answers are "NO", then may proceed with Cephalosporin use.     Patient Measurements: Height: 5' 10.98" (180.3 cm) Weight: 271 lb 2.7 oz (123 kg) IBW/kg (Calculated) : 75.26 Heparin Dosing Weight: 98.3 kg  Vital Signs: Temp: 100.6 F (38.1 C) (06/23 0745) Temp Source: Core (06/23 0700) BP: 79/66 (06/23 0745) Pulse Rate: 79 (06/23 0745)  Labs: Recent Labs    08/20/17 0429  08/21/17 0335 08/21/17 0346 08/21/17 1244 08/21/17 1541 08/21/17 1957 08/22/17 0406  HGB 8.3*  --  7.9*  --   --   --  8.3* 8.4*  HCT 26.4*  --  24.7*  --   --   --  26.4* 26.4*  PLT 134*  --  147*  --   --   --  178 194  LABPROT 16.8*  --  17.2*  --   --   --   --  19.1*  INR 1.37  --  1.42  --   --   --   --  1.62  HEPARINUNFRC  --    < >  --  0.32 0.34  --   --  0.27*  CREATININE 0.86   < > 0.93  --   --  0.97  --  0.98   < > = values in this interval not displayed.    Estimated Creatinine Clearance: 97.7 mL/min (by C-G formula based on SCr of 0.98 mg/dL).   Medical History: Past Medical History:  Diagnosis Date  . AICD (automatic cardioverter/defibrillator) present 02/05/2014   Upgrade to Medtronic biventricular ICD, serial number  BLD [id number]   . Atrial flutter ([place]) 04/2012   s/p TEE-EPS+RFCA 04/2012  . CAD (coronary artery disease) 8177,1165 X 2    RCA-T, 70% PL (off CFX), 99% Prox LAD/90% Dist LAD, S/P TAXUS stent x 2  . CHF  (congestive heart failure) ([place])   . Chronic anticoagulation   . Chronic systolic heart failure ([place])   . CKD (chronic kidney disease)   . Diabetic retinopathy ([place])   . DM type 2 (diabetes mellitus, type 2) (HCC)    insulin dependent  . HTN (hypertension)   . Hypercholesteremia    ablation  . ICD (implantable cardiac defibrillator) in place   . Ischemic cardiomyopathy March 2015   20-25% 2D   . Nephrolithiasis   . Ventricular tachycardia (HCC)     Medications:  Scheduled:  . aspirin EC  325 mg Oral Daily   Or  . aspirin  324 mg Per Tube Daily   Or  . aspirin  300 mg Rectal Daily  . bisacodyl  10 mg Oral Daily   Or  . bisacodyl  10 mg Rectal Daily  . chlorhexidine gluconate (MEDLINE KIT)  15 mL Mouth Rinse BID  . Chlorhexidine Gluconate Cloth  6 each Topical Daily  . feeding supplement (PRO-STAT SUGAR FREE 64)  30 mL Per Tube BID  . insulin aspart  0-24 Units Subcutaneous Q4H  . insulin  detemir  30 Units Subcutaneous BID  . levalbuterol  0.63 mg Nebulization TID  . mouth rinse  15 mL Mouth Rinse 10 times per day  . metoCLOPramide (REGLAN) injection  10 mg Intravenous Q6H  . rifampin  600 mg Oral Once  . sodium chloride flush  10-40 mL Intracatheter Q12H  . Warfarin - Pharmacist Dosing Inpatient   Does not apply q1800    Assessment: 75 yom who underwent LVAD placement on 6/17. Heparin infusion has been running at 900 units/hr per physician. Underwent sternal closure yesterday on 6/20.  Heparin level slightly below goal this AM.  No overt bleeding or complications noted.  Pharmacy dosing Coumadin, had first dose of 5 mg last night.  INR with slight increase today.  Goal of Therapy:  Heparin level 0.3-0.5 units/ml Monitor platelets by anticoagulation protocol: Yes   Plan:  Increase IV Heparin to 1350 units/hr. Recheck heparin level in 8 hrs. Warfarin 7.5 mg po  tonight. Monitor CBC, heparin level, INR, and s/sx of bleeding  Marguerite Olea,  [facility] Clinical Pharmacist Phone [phone]  08/22/2017 7:54 AM

## 2017-08-23 ENCOUNTER — Inpatient Hospital Stay (HOSPITAL_COMMUNITY): Payer: PPO

## 2017-08-23 DIAGNOSIS — I4901 Ventricular fibrillation: Secondary | ICD-10-CM

## 2017-08-23 LAB — CBC WITH DIFFERENTIAL/PLATELET
Abs Immature Granulocytes: 0.2 10*3/uL — ABNORMAL HIGH (ref 0.0–0.1)
Basophils Absolute: 0.1 10*3/uL (ref 0.0–0.1)
Basophils Relative: 0 %
Eosinophils Absolute: 0 10*3/uL (ref 0.0–0.7)
Eosinophils Relative: 0 %
HCT: 26.5 % — ABNORMAL LOW (ref 39.0–52.0)
Hemoglobin: 8.5 g/dL — ABNORMAL LOW (ref 13.0–17.0)
Immature Granulocytes: 1 %
Lymphocytes Relative: 7 %
Lymphs Abs: 1 10*3/uL (ref 0.7–4.0)
MCH: 26.5 pg (ref 26.0–34.0)
MCHC: 32.1 g/dL (ref 30.0–36.0)
MCV: 82.6 fL (ref 78.0–100.0)
Monocytes Absolute: 1.5 10*3/uL — ABNORMAL HIGH (ref 0.1–1.0)
Monocytes Relative: 11 %
Neutro Abs: 11 10*3/uL — ABNORMAL HIGH (ref 1.7–7.7)
Neutrophils Relative %: 81 %
Platelets: 261 10*3/uL (ref 150–400)
RBC: 3.21 MIL/uL — ABNORMAL LOW (ref 4.22–5.81)
RDW: 15.9 % — ABNORMAL HIGH (ref 11.5–15.5)
WBC: 13.7 10*3/uL — ABNORMAL HIGH (ref 4.0–10.5)

## 2017-08-23 LAB — BASIC METABOLIC PANEL
Anion gap: 11 (ref 5–15)
Anion gap: 13 (ref 5–15)
BUN: 27 mg/dL — AB (ref 6–20)
BUN: 30 mg/dL — AB (ref 6–20)
CHLORIDE: 99 mmol/L — AB (ref 101–111)
CHLORIDE: 96 mmol/L — AB (ref 101–111)
CO2: 24 mmol/L (ref 22–32)
CO2: 25 mmol/L (ref 22–32)
CREATININE: 1.14 mg/dL (ref 0.61–1.24)
CREATININE: 1.1 mg/dL (ref 0.61–1.24)
Calcium: 8.3 mg/dL — ABNORMAL LOW (ref 8.9–10.3)
Calcium: 8.4 mg/dL — ABNORMAL LOW (ref 8.9–10.3)
GFR calc Af Amer: >60 mL/min (ref >60)
GFR calc Af Amer: >60 mL/min (ref >60)
GFR calc non Af Amer: >60 mL/min (ref >60)
GFR calc non Af Amer: >60 mL/min (ref >60)
GLUCOSE: 175 mg/dL — AB (ref 65–99)
Glucose, Bld: 234 mg/dL — ABNORMAL HIGH (ref 65–99)
POTASSIUM: 3 mmol/L — AB (ref 3.5–5.1)
POTASSIUM: 3.6 mmol/L (ref 3.5–5.1)
Sodium: 134 mmol/L — ABNORMAL LOW (ref 135–145)
Sodium: 134 mmol/L — ABNORMAL LOW (ref 135–145)

## 2017-08-23 LAB — CULTURE, RESPIRATORY W GRAM STAIN
Culture: NO GROWTH
Special Requests: NORMAL

## 2017-08-23 LAB — COOXEMETRY PANEL
Carboxyhemoglobin: 1.3 % (ref 0.5–1.5)
Carboxyhemoglobin: 1.5 % (ref 0.5–1.5)
Methemoglobin: 1.6 % — ABNORMAL HIGH (ref 0.0–1.5)
Methemoglobin: 1.5 % (ref 0.0–1.5)
O2 Saturation: 54.3 %
O2 Saturation: 62.9 %
Total hemoglobin: 9.1 g/dL — ABNORMAL LOW (ref 12.0–16.0)
Total hemoglobin: 8.5 g/dL — ABNORMAL LOW (ref 12.0–16.0)

## 2017-08-23 LAB — BRAIN NATRIURETIC PEPTIDE: B Natriuretic Peptide: 450.5 pg/mL — ABNORMAL HIGH (ref 0.0–100.0)

## 2017-08-23 LAB — POCT I-STAT 3, ART BLOOD GAS (G3+)
Bicarbonate: 23.9 mmol/L (ref 20.0–28.0)
O2 SAT: 95 %
TCO2: 25 mmol/L (ref 22–32)
pCO2 arterial: 36 mmHg (ref 32.0–48.0)
pH, Arterial: 7.433 (ref 7.350–7.450)
pO2, Arterial: 80 mmHg — ABNORMAL LOW (ref 83.0–108.0)

## 2017-08-23 LAB — PHOSPHORUS: Phosphorus: 2.4 mg/dL — ABNORMAL LOW (ref 2.5–4.6)

## 2017-08-23 LAB — COMPREHENSIVE METABOLIC PANEL
ALT: 44 U/L (ref 17–63)
AST: 110 U/L — ABNORMAL HIGH (ref 15–41)
Albumin: 2.3 g/dL — ABNORMAL LOW (ref 3.5–5.0)
Alkaline Phosphatase: 67 U/L (ref 38–126)
Anion gap: 12 (ref 5–15)
BUN: 23 mg/dL — ABNORMAL HIGH (ref 6–20)
CO2: 25 mmol/L (ref 22–32)
Calcium: 8.4 mg/dL — ABNORMAL LOW (ref 8.9–10.3)
Chloride: 98 mmol/L — ABNORMAL LOW (ref 101–111)
Creatinine, Ser: 1.05 mg/dL (ref 0.61–1.24)
GFR calc Af Amer: >60 mL/min (ref >60)
GFR calc non Af Amer: >60 mL/min (ref >60)
Glucose, Bld: 218 mg/dL — ABNORMAL HIGH (ref 65–99)
Potassium: 3.3 mmol/L — ABNORMAL LOW (ref 3.5–5.1)
Sodium: 135 mmol/L (ref 135–145)
Total Bilirubin: 1.4 mg/dL — ABNORMAL HIGH (ref 0.3–1.2)
Total Protein: 6.9 g/dL (ref 6.5–8.1)

## 2017-08-23 LAB — GLUCOSE, CAPILLARY
Glucose-Capillary: 209 mg/dL — ABNORMAL HIGH (ref 65–99)
Glucose-Capillary: 209 mg/dL — ABNORMAL HIGH (ref 65–99)
Glucose-Capillary: 484 mg/dL — ABNORMAL HIGH (ref 65–99)
Glucose-Capillary: 278 mg/dL — ABNORMAL HIGH (ref 65–99)
Glucose-Capillary: 90 mg/dL (ref 65–99)
Glucose-Capillary: 149 mg/dL — ABNORMAL HIGH (ref 65–99)

## 2017-08-23 LAB — HEPARIN LEVEL (UNFRACTIONATED): Heparin Unfractionated: 0.27 IU/mL — ABNORMAL LOW (ref 0.30–0.70)

## 2017-08-23 LAB — LACTATE DEHYDROGENASE: LDH: 324 U/L — ABNORMAL HIGH (ref 98–192)

## 2017-08-23 LAB — PROTIME-INR
INR: 2.64
Prothrombin Time: 28 seconds — ABNORMAL HIGH (ref 11.4–15.2)

## 2017-08-23 LAB — MAGNESIUM: Magnesium: 1.8 mg/dL (ref 1.7–2.4)

## 2017-08-23 MED ORDER — AMIODARONE IV BOLUS ONLY 150 MG/100ML
150.0000 mg | Freq: Once | INTRAVENOUS | Status: AC
  Administered 2017-08-23: 150 mg via INTRAVENOUS

## 2017-08-23 MED ORDER — POTASSIUM CHLORIDE 20 MEQ/15ML (10%) PO SOLN
40.0000 meq | Freq: Every day | ORAL | Status: DC
  Administered 2017-08-23 – 2017-08-25 (×3): 40 meq via ORAL
  Filled 2017-08-23: qty 30

## 2017-08-23 MED ORDER — POTASSIUM CHLORIDE 20 MEQ/15ML (10%) PO SOLN
ORAL | Status: AC
  Filled 2017-08-23: qty 30

## 2017-08-23 MED ORDER — POTASSIUM CHLORIDE CRYS ER 20 MEQ PO TBCR
40.0000 meq | EXTENDED_RELEASE_TABLET | Freq: Once | ORAL | Status: AC
  Administered 2017-08-23: 40 meq via ORAL
  Filled 2017-08-23: qty 2

## 2017-08-23 MED ORDER — METOLAZONE 5 MG PO TABS
5.0000 mg | ORAL_TABLET | Freq: Two times a day (BID) | ORAL | Status: DC
  Administered 2017-08-23 – 2017-08-26 (×7): 5 mg via ORAL
  Filled 2017-08-23 (×7): qty 1

## 2017-08-23 MED ORDER — POTASSIUM CHLORIDE 10 MEQ/50ML IV SOLN
INTRAVENOUS | Status: AC
  Filled 2017-08-23: qty 50

## 2017-08-23 MED ORDER — SODIUM BICARBONATE 650 MG PO TABS: 650.0000 mg | ORAL_TABLET | Freq: Once | ORAL | Status: DC

## 2017-08-23 MED ORDER — PANCRELIPASE (LIP-PROT-AMYL) 10440-39150 UNITS PO TABS: 20880.0000 [IU] | ORAL_TABLET | Freq: Once | ORAL | Status: DC

## 2017-08-23 MED ORDER — ASPIRIN 81 MG PO CHEW
81.0000 mg | CHEWABLE_TABLET | Freq: Every day | ORAL | Status: DC
  Administered 2017-08-24 – 2017-09-10 (×18): 81 mg via ORAL
  Filled 2017-08-23 (×18): qty 1

## 2017-08-23 MED ORDER — POTASSIUM CHLORIDE 10 MEQ/50ML IV SOLN
10.0000 meq | INTRAVENOUS | Status: AC
  Administered 2017-08-23 (×3): 10 meq via INTRAVENOUS
  Filled 2017-08-23 (×3): qty 50

## 2017-08-23 MED ORDER — LEVOFLOXACIN IN D5W 750 MG/150ML IV SOLN
750.0000 mg | INTRAVENOUS | Status: DC
  Administered 2017-08-23: 750 mg via INTRAVENOUS
  Filled 2017-08-23: qty 150

## 2017-08-23 MED ORDER — POTASSIUM CHLORIDE 10 MEQ/50ML IV SOLN
10.0000 meq | INTRAVENOUS | Status: AC | PRN
  Administered 2017-08-23 (×3): 10 meq via INTRAVENOUS
  Filled 2017-08-23 (×3): qty 50

## 2017-08-23 MED ORDER — ENSURE ENLIVE PO LIQD
237.0000 mL | Freq: Once | ORAL | Status: AC
  Administered 2017-08-23: 237 mL

## 2017-08-23 MED ORDER — POTASSIUM CHLORIDE 20 MEQ/15ML (10%) PO SOLN
30.0000 meq | Freq: Once | ORAL | Status: AC
  Administered 2017-08-23: 30 meq via ORAL
  Filled 2017-08-23: qty 30

## 2017-08-23 MED ORDER — POTASSIUM CHLORIDE 10 MEQ/50ML IV SOLN
10.0000 meq | INTRAVENOUS | Status: AC
Start: 2017-08-23 — End: 2017-08-23
  Administered 2017-08-23 (×4): 10 meq via INTRAVENOUS
  Filled 2017-08-23 (×4): qty 50

## 2017-08-23 MED ORDER — WARFARIN 0.5 MG HALF TABLET
0.5000 mg | ORAL_TABLET | Freq: Once | ORAL | Status: AC
  Administered 2017-08-23: 0.5 mg via ORAL
  Filled 2017-08-23: qty 1

## 2017-08-23 MED ORDER — MAGNESIUM SULFATE 2 GM/50ML IV SOLN
2.0000 g | Freq: Once | INTRAVENOUS | Status: AC
  Administered 2017-08-23: 2 g via INTRAVENOUS
  Filled 2017-08-23: qty 50

## 2017-08-23 MED FILL — Medication: Qty: 1 | Status: AC

## 2017-08-23 NOTE — Progress Notes (Signed)
OT Cancellation Note  Patient Details Name: BREVON DEWALD MRN: [id number] DOB: 12/09/1950   Cancelled Treatment:    Reason Eval/Treat Not Completed: Patient not medically ready. Pt heavily sedated, unable to participate. Will continue attempts.  Malka So 08/23/2017, 8:22 AM  08/23/2017 Nestor Lewandowsky, OTR/L Pager: [phone]

## 2017-08-23 NOTE — Progress Notes (Signed)
Inpatient Diabetes Program Recommendations  AACE/ADA: New Consensus Statement on Inpatient Glycemic Control (2015)  Target Ranges:  Prepandial:   less than 140 mg/dL      Peak postprandial:   less than 180 mg/dL (1-2 hours)      Critically ill patients:  140 - 180 mg/dL   Lab Results  Component Value Date   GLUCAP 209 (H) 08/23/2017   HGBA1C 8.4 (H) 08/14/2017    Review of Glycemic Control Results for LANSON, RANDLE (MRN [id number]) as of 08/23/2017 11:06  Ref. Range 08/22/2017 16:43 08/22/2017 19:49 08/22/2017 23:13 08/23/2017 04:17 08/23/2017 07:50  Glucose-Capillary Latest Ref Range: 65 - 99 mg/dL 241 (H) 215 (H) 219 (H) 209 (H) 209 (H)   Diabetes history: Type 2 DM Outpatient Diabetes medications: Trulicity 1.5 mg Q/wk, Metformin 750 mg QAM, NPH (per Dr Jodelle Green note) 30 units BID Current orders for Inpatient glycemic control: Novolog 0-24 units Q4H, Levemir 30 units BID  Inpatient Diabetes Program Recommendations:    May want to consider tube feed coverage, Novolog 5 units Q4H.  Thanks, Bronson Curb, MSN, RNC-OB Diabetes Coordinator [phone] (8a-5p)

## 2017-08-23 NOTE — Progress Notes (Signed)
LVAD Coordinator Rounding Not  Admitted 08/11/17 for initiation of Milrinone. 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 He was evaluated [facility] /Dr Posey Pronto on 5/9 for possible heart transplant.   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 Prescott Gum under Destination Therapy criteria. Dr [name] discussed heart transplant candidacy with Dr Posey Pronto. Given size and blood type there was concern he would not make it to transplant.   Pt remains intubated and sedated with mittens on. Pt is agitated. Wife at bedside.   Vital signs: Temp:  100.8 HR: 75 Arterial line:  121/70 (84) Doppler: not done O2 Sat:  99% on ventilator Wt: 238>246>242>274>267>259>269lbs   LVAD interrogation reveals:  Speed: 5200 Flow: 3.8 Power:  3.5w PI:  4.4 Alarms: pt had 3 low flows on Saturday between [place] per bedside nurse the pt was in [place] at that time Events: 6 PI event Hematocrit:  27 Fixed speed: 5200 Low speed limit: 4900  Drive Line: Driveline dressing changed by the wife with VAD coordinator observing. Existing VAD dressing removed and site care performed using sterile technique. Drive line exit site cleaned with Chlora prep applicators x 2, allowed to dry, and gauze dressing with silver strip re-applied. Exit site healing and unincorporated, the velour is fully implanted at exit site. Scant amount of bloody drainage. No redness, tenderness, foul odor or rash noted. Drive line anchor secure.        Labs:  LDH trend: 274>287>245>240>324 INR trend:  1.71>1.92>1.57>1.37>2.64  Anticoagulation Plan: -INR Goal: 2.0 - 2.5 -ASA Dose: 81 mg daily   Blood Products:  Intra op: - 08/16/17> one platelet; 2 FFP - 08/17/17> ome FFP  Post op: - 08/16/17 > one unit PCs - 08/21/17 > one unit PCs  Device:  - BiV Medtronic - Therapies: off  Respiratory: vented  Nitric Oxide:  10 ppm  Gtts: - Epi 2 mcg/min - Milrinone 0.5 mcg/kg/min - Lasix 20 mg/hr - Precedex 1.2  mcg/kg/hr - Amiodarone 30 mg/hr - Fentanyl 300 mcg/hr - Levo 68mcg/min  Adverse Events on VAD: - 08/17/17>mediastinal reexploration with evacuation of mediastinal clot - 08/19/17>sternal closure  VAD education:  Pt remains intubated, unable to initiate education. Wife performed dressing change today with VAD coordinator observing.  Plan/Recommendations:  1. Daily dressing changes per VAD coordinator, nurse champion, or trained caregiver.  2. Page VAD Coordinator with any VAD equipment or drive line issues.   Tanda Rockers RN, VAD Coordinator 24/7 VAD Pager: [phone]

## 2017-08-23 NOTE — Progress Notes (Addendum)
   Called by nursing staff for VF 250s. Dr [name] at the bedside.   Remains intubated.   On amio drip at 30 mg per hour, epi 2 mcg, milrionoe 0.5 mcg + NE 4 mcg.   Maps 60-70s by A line.   Sedated with IV fentanyl. Defibrillated x1. Back in A fib with rate in 80s.   Replace K. Received 2 grams IV mag this morning.   Amy Clegg NP-C  3:31 PM  Called emergently to bedside for VF. Patient being diuresed aggressively. K down to 3.0. Mg supped earlier.   Rates ~ 250. MAP 60-70s. NE increased by RN.   Patient on vent with waxing and waning mentation.   Given additional sedation. Pads placed on chest and performed emergent defibrillation with 200J and conversion to his baseline AF. K supped aggressively. Flows on VAD stable so VAD speed not changed. Lasix gtt decreased.   Additional CCT 35 minutes.   Glori Bickers, MD  8:33 PM

## 2017-08-23 NOTE — Progress Notes (Signed)
Pt HR in Vib 250's. Pt symptomatic with dec BP, MAP 70s. MD paged. Amio 150 bolus given, fent 50 bolus, dec lasix gtt to 12 and one defib shock. Pt back in afib, HR 80s. Pt K 3.0, 4 runs IV and 40 PO ordered. RN will continue to monitor.

## 2017-08-23 NOTE — Progress Notes (Signed)
[place] for coumadin Indication: LVAD  Allergies  Allergen Reactions  . Penicillins Hives, Itching and Other (See Comments)    Has patient had a PCN reaction causing immediate rash, facial/tongue/throat swelling, SOB or lightheadedness with hypotension:# # Yes # # Has patient had a PCN reaction causing severe rash involving mucus membranes or skin necrosis: No Has patient had a PCN reaction that required hospitalization: No Has patient had a PCN reaction occurring within the last 10 years: No If all of the above answers are "NO", then may proceed with Cephalosporin use.     Patient Measurements: Height: 5' 10.98" (180.3 cm) Weight: 269 lb 10 oz (122.3 kg) IBW/kg (Calculated) : 75.26 Heparin Dosing Weight: 98.3 kg  Vital Signs: Temp: 100.9 F (38.3 C) (06/24 0900) Temp Source: Core (06/24 0800) BP: 103/86 (06/24 0900) Pulse Rate: 70 (06/24 0900)  Labs: Recent Labs    08/21/17 0335  08/21/17 1541 08/21/17 1957 08/22/17 0406 08/22/17 1644 08/23/17 0412  HGB 7.9*  --   --  8.3* 8.4*  --  8.5*  HCT 24.7*  --   --  26.4* 26.4*  --  26.5*  PLT 147*  --   --  178 194  --  261  LABPROT 17.2*  --   --   --  19.1*  --  28.0*  INR 1.42  --   --   --  1.62  --  2.64  HEPARINUNFRC  --    < >  --   --  0.27* 0.35 0.27*  CREATININE 0.93  --  0.97  --  0.98  --  1.05   < > = values in this interval not displayed.    Estimated Creatinine Clearance: 90.9 mL/min (by C-G formula based on SCr of 1.05 mg/dL).   Medical History: Past Medical History:  Diagnosis Date  . AICD (automatic cardioverter/defibrillator) present 02/05/2014   Upgrade to Medtronic biventricular ICD, serial number  BLD [id number]   . Atrial flutter ([place]) 04/2012   s/p TEE-EPS+RFCA 04/2012  . CAD (coronary artery disease) 4801,6553 X 2    RCA-T, 70% PL (off CFX), 99% Prox LAD/90% Dist LAD, S/P TAXUS stent x 2  . CHF (congestive heart failure) ([place])   . Chronic  anticoagulation   . Chronic systolic heart failure ([place])   . CKD (chronic kidney disease)   . Diabetic retinopathy ([place])   . DM type 2 (diabetes mellitus, type 2) (HCC)    insulin dependent  . HTN (hypertension)   . Hypercholesteremia    ablation  . ICD (implantable cardiac defibrillator) in place   . Ischemic cardiomyopathy March 2015   20-25% 2D   . Nephrolithiasis   . Ventricular tachycardia (HCC)     Medications:  Scheduled:  . aspirin EC  325 mg Oral Daily   Or  . aspirin  324 mg Per Tube Daily   Or  . aspirin  300 mg Rectal Daily  . bisacodyl  10 mg Oral Daily   Or  . bisacodyl  10 mg Rectal Daily  . chlorhexidine gluconate (MEDLINE KIT)  15 mL Mouth Rinse BID  . Chlorhexidine Gluconate Cloth  6 each Topical Daily  . feeding supplement (ENSURE ENLIVE)  237 mL Per Tube Once  . feeding supplement (PRO-STAT SUGAR FREE 64)  30 mL Per Tube BID  . insulin aspart  0-24 Units Subcutaneous Q4H  . insulin detemir  30 Units Subcutaneous BID  . levalbuterol  0.63 mg  Nebulization TID  . mouth rinse  15 mL Mouth Rinse 10 times per day  . metoCLOPramide (REGLAN) injection  10 mg Intravenous Q6H  . sodium chloride flush  10-40 mL Intracatheter Q12H  . warfarin  0.5 mg Oral ONCE-1800  . Warfarin - Pharmacist Dosing Inpatient   Does not apply q1800    Assessment: 89 yom who underwent LVAD placement on 6/17. Heparin infusion has been running at 900 units/hr per physician. Underwent sternal closure yesterday on 6/20. Pharmacy resume heparin dosing post-procedure.  Heparin level this morning slightly subtherapeutic at 0.27, on 1350 units/hr. INR jumped from 1.62 to 2.64 today - has received 3 doses of warfarin so far. Hgb 8.5, plt 261. LDH stable. On concurrent amiodarone IV and levofloxacin, which can impact INR sensitivity.   Goal of Therapy:  INR goal Monitor platelets by anticoagulation protocol: Yes   Plan:  Stop IV Heparin given jump in INR. Will order 1 dose of ensure.   Will order warfarin 0.5 mg tonight.  Monitor daily INR, CBC, and s/sx of bleeding.  Doylene Canard, PharmD Clinical Pharmacist  Pager: [phone] Phone: [phone]  08/23/2017 10:08 AM

## 2017-08-23 NOTE — Progress Notes (Addendum)
Patient ID: [name] [name], male   DOB: [birth year], 67 y.o.   MRN: [id number]     Advanced Heart Failure Rounding Note  PCP-Cardiologist: No primary care provider on file.   Subjective:    Events 6/17 Underwent HM-3 implant -> Unable to close chest with RV failure and high intrathoracic pressure 6/18 Taken back to OR for evacuation of hematoma. NO RVAD needed. Left sternum open  08/19/17 Taken back to OR for sternal closure 6/22 VT => amiodarone started.   Remains intubated/sedated.  On milrinone 0.5/EPI 2 mcg , lasix 12 mg per hour. NO at 10 ppm.   Swan CVP 17 PA 56/22  CO 6 CI 2.6 Co-ox 54%.    LVAD Interrogation HM 3: Speed: 5200 Flow: 3.6  PI: 3.9  Power: 4    Objective:   Weight Range: 269 lb 10 oz (122.3 kg) Body mass index is 37.62 kg/m.   Vital Signs:   Temp:  [100.2 F (37.9 C)-100.9 F (38.3 C)] 100.9 F (38.3 C) (06/24 0900) Pulse Rate:  [54-142] 70 (06/24 0900) Resp:  [12-24] 20 (06/24 0900) BP: (78-119)/(51-95) 103/86 (06/24 0900) SpO2:  [94 %-100 %] 99 % (06/24 0900) Arterial Line BP: (79-151)/(54-84) 109/71 (06/24 0900) FiO2 (%):  [40 %] 40 % (06/24 0757) Weight:  [269 lb 10 oz (122.3 kg)] 269 lb 10 oz (122.3 kg) (06/24 0306) Last BM Date: (smear)  Weight change: Filed Weights   08/21/17 0600 08/22/17 0400 08/23/17 0306  Weight: 262 lb 12.6 oz (119.2 kg) 271 lb 2.7 oz (123 kg) 269 lb 10 oz (122.3 kg)    Intake/Output:   Intake/Output Summary (Last 24 hours) at 08/23/2017 1002 Last data filed at 08/23/2017 0900 Gross per 24 hour  Intake 5217.45 ml  Output 7220 ml  Net -2002.55 ml      Physical Exam   CVP 19 Physical Exam: GENERAL: intubated  HEENT: normal ETT  NECK: Supple, JVP to jaw  .  2+ bilaterally, no bruits.  No lymphadenopathy or thyromegaly appreciated.   CARDIAC:  Mechanical heart sounds with LVAD hum present.  LUNGS:  Clear to auscultation bilaterally.  ABDOMEN:  Soft, round, nontender, positive bowel sounds x4.       LVAD exit site: dressing dry and intact EXTREMITIES:  Warm and dry, no cyanosis, clubbing, rash . R and LLE SCDs.  NEUROLOGIC: intubated. Opens eyes.  GU: foley yellow urine.    Telemetry   V paced 90s   Labs    CBC Recent Labs    08/22/17 0406 08/23/17 0412  WBC 8.8 13.7*  NEUTROABS 6.5 11.0*  HGB 8.4* 8.5*  HCT 26.4* 26.5*  MCV 84.9 82.6  PLT 194 347   Basic Metabolic Panel Recent Labs    08/22/17 0406 08/23/17 0412  NA 133* 135  K 4.0 3.3*  CL 102 98*  CO2 22 25  GLUCOSE 232* 218*  BUN 18 23*  CREATININE 0.98 1.05  CALCIUM 8.0* 8.4*  MG 2.0 1.8  PHOS 2.4* 2.4*   Liver Function Tests Recent Labs    08/22/17 0406 08/23/17 0412  AST 48* 110*  ALT 18 44  ALKPHOS 58 67  BILITOT 1.2 1.4*  PROT 6.0* 6.9  ALBUMIN 2.3* 2.3*   No results for input(s): LIPASE, AMYLASE in the last 72 hours. Cardiac Enzymes No results for input(s): CKTOTAL, CKMB, CKMBINDEX, TROPONINI in the last 72 hours.  BNP: BNP (last 3 results) Recent Labs    08/11/17 1438 08/17/17 0314 08/23/17 0024  BNP  529.3* 266.4* 450.5*    ProBNP (last 3 results) Recent Labs    11/12/16 1559  PROBNP 388.0*     D-Dimer No results for input(s): DDIMER in the last 72 hours. Hemoglobin A1C No results for input(s): HGBA1C in the last 72 hours. Fasting Lipid Panel No results for input(s): CHOL, HDL, LDLCALC, TRIG, CHOLHDL, LDLDIRECT in the last 72 hours. Thyroid Function Tests No results for input(s): TSH, T4TOTAL, T3FREE, THYROIDAB in the last 72 hours.  Invalid input(s): FREET3  Other results:   Imaging    Dg Chest Port 1 View  Result Date: 08/23/2017 CLINICAL DATA:  Follow-up LVAD EXAM: PORTABLE CHEST 1 VIEW COMPARISON:  08/22/2017 FINDINGS: Cardiac shadow is enlarged. Defibrillator and left ventricular assist device are again seen and stable. Endotracheal tube and nasogastric catheter are noted in satisfactory position. Swan-Ganz catheter and right jugular central line are  again seen and stable. Left thoracostomy catheter and mediastinal drain are stable in appearance. Diffuse vascular congestion with interstitial changes are again noted. Small effusions are again seen and stable. IMPRESSION: Stable appearance of the chest with findings of CHF and interstitial edema. Tubes and lines are stable. Electronically Signed   By: Inez Catalina M.D.   On: 08/23/2017 08:16     Medications:     Scheduled Medications: . aspirin EC  325 mg Oral Daily   Or  . aspirin  324 mg Per Tube Daily   Or  . aspirin  300 mg Rectal Daily  . bisacodyl  10 mg Oral Daily   Or  . bisacodyl  10 mg Rectal Daily  . chlorhexidine gluconate (MEDLINE KIT)  15 mL Mouth Rinse BID  . Chlorhexidine Gluconate Cloth  6 each Topical Daily  . feeding supplement (PRO-STAT SUGAR FREE 64)  30 mL Per Tube BID  . insulin aspart  0-24 Units Subcutaneous Q4H  . insulin detemir  30 Units Subcutaneous BID  . levalbuterol  0.63 mg Nebulization TID  . mouth rinse  15 mL Mouth Rinse 10 times per day  . metoCLOPramide (REGLAN) injection  10 mg Intravenous Q6H  . rifampin  600 mg Oral Once  . sodium chloride flush  10-40 mL Intracatheter Q12H  . Warfarin - Pharmacist Dosing Inpatient   Does not apply q1800    Infusions: . sodium chloride Stopped (08/23/17 0844)  . sodium chloride    . sodium chloride 10 mL/hr at 08/21/17 0200  . sodium chloride 10 mL/hr at 08/23/17 0906  . amiodarone 30 mg/hr (08/23/17 0900)  . dexmedetomidine (PRECEDEX) IV infusion 1.2 mcg/kg/hr (08/23/17 0904)  . EPINEPHrine 4 mg in dextrose 5% 250 mL infusion (16 mcg/mL) 2 mcg/min (08/23/17 0900)  . famotidine (PEPCID) IV 20 mg (08/23/17 0906)  . feeding supplement (VITAL HIGH PROTEIN) 1,000 mL (08/23/17 0217)  . fentaNYL infusion INTRAVENOUS 200 mcg/hr (08/23/17 0900)  . furosemide (LASIX) infusion 12 mg/hr (08/23/17 0955)  . heparin 1,350 Units/hr (08/23/17 0900)  . lactated ringers Stopped (08/18/17 0933)  . lactated ringers 10  mL/hr at 08/23/17 0900  . lactated ringers Stopped (08/19/17 1602)  . levofloxacin (LEVAQUIN) IV Stopped (08/22/17 2139)  . milrinone 0.5 mcg/kg/min (08/23/17 0900)  . norepinephrine (LEVOPHED) Adult infusion 4 mcg/min (08/23/17 0900)    PRN Medications: sodium chloride, acetaminophen, fentaNYL, hydrALAZINE, lactated ringers, midazolam, morphine injection, ondansetron (ZOFRAN) IV, oxyCODONE, sodium chloride flush    Patient Profile  FERLIN FAIRHURST is a 67 y.o. male with a past medical history of chronic systolic CHF due to ICM,  [name], [name], [name], [name], [name], HTN, HL, [name]   Directly admitted with persistent low cardiac output for milrinone initiation for home.  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 Acute/Chronic systolic CHF with biventricular failure-> cardiogenic shock: Echo  08/13/2017 EF 20-25%. s/p  Medtronic BiV ICD in place. Cath 12/18 with stable 1v CAD.  s/p HM-3 implant 6/17. Unable to close chest due to high-intrathoracic pressures and RV failure. Taken back to OR 08/17/17 with evacuation of hematoma with improvement.   - Remains intubated/sedated. On milrinone 0.5/EPI 2 mcg , lasix 12 mg per hour.  -CO-OX - 54% - Marked volume overload. Increase lasix drip to 20 mg per hour and increaes metolazone to 5 mg twice a day. Supp K.  - - VAD interrogated personally. Parameters stable.  - INR 2.64. Discussed with pharmacy.  - VT quiescent. Was on sotalol pre-op. Now on IV amio. Keep K > 4.0 Mg > 2.0 2. VAD - s/p HM-3 implant 6/17 - VAD interrogated personally. Parameters stable. - INR 2.6. Stop heparin drip. Discussed with pharmacy.  - Cut back asa to 81 mg per hour.  - LDH 274>324  3. [name]: Recent cath with stable CAD as above - No change to current plan.   4. [name]: Recent A1c 8.3 on 6/3.  - Cover with SSI in house.  4. CKD II-III:  -Creatinine improved with hemodynamic support -  Creatinine 1.05.  5. [name]: Recurrent VT on 6/22 - Now quiescent. Continue amiodarone gtt.  - Keep K > 4.0 Mg > 2.0.  6. PAF s/p ablation 04/2012: Device interrogation showed A fib since May 16th. Remains in A fib 90s . On amio drip for rate control.  7. Anemia: Post-op.  Got 1 unit PRBCs 6/22. Hgb 8.5. Stable.   Length of Stay: [place], NP  08/23/2017, 10:02 AM  Advanced Heart Failure Team Pager [phone] (M-F; 7a - 4p)  Please contact [facility] for night-coverage after hours (4p -7a ) and weekends on [email or web address]  Agree with above.  He remains very tenuous post-VAD placement. Unable to extubate yesterday due to marked volume overload and pulmonary edema. Lasix drip increased and given a dose of metolazone. Had excellent diuresis but weight down just a few pounds as he is getting a high volume of IVF. INR has jumped up rapidly. Heparin stopped. Chest tube drainage minimal. Remains on low-dose epi and milrinone. Co-ox ok. K and Mg low.   On exam Awake on vent RIJ swan. JVP to jaw Cor: LVAD hum +CTs. Sternal dressing ok Ab distended mild BS driveline site ok Ext 3+ edema into thighs   Remains markedly volume overloaded - still about 30 pounds up from pre-op. Unable to extubate. Will increase lasix to 20 and increase metolazone to 5 bid. Continue epi and milrinone. INR 2.8. Heparin stopped. Will give a little vitamin K to avoid it jumping up higher. Discussed dosing with PharmD personally. CT drainage low. Hopefully can remove soon. Continues with low grade temps. On Levaquin. Low threshold to redraw cultures and expand coverage.   CRITICAL CARE Performed by: Glori Bickers  Total critical care time: 45 minutes  Critical care time was exclusive of separately billable procedures and treating other patients.  Critical care was necessary to treat or prevent imminent or life-threatening deterioration.  Critical care was time spent personally by me (independent  of midlevel  providers or residents) on the following activities: development of treatment plan with patient and/or surrogate as well as nursing, discussions with consultants, evaluation of patient's response to treatment, examination of patient, obtaining history from patient or surrogate, ordering and performing treatments and interventions, ordering and review of laboratory studies, ordering and review of radiographic studies, pulse oximetry and re-evaluation of patient's condition.  Glori Bickers, MD

## 2017-08-23 NOTE — Progress Notes (Signed)
Patient ID: [name] [name], male   DOB: January 18, 1951, 67 y.o.   MRN: [id number] HeartMate 3 Rounding Note  Subjective:    Remains sedated on vent but more alert today. Tried to get up out of bed this afternoon.  Hemodynamics have been fairly stable with some HTN. Had episode of VF this afternoon with rate 250 and drop in BP, decreased responsiveness, and was defibrillated by DB. K was 3.0 this afternoon. Received Mg 2+ this am for 1.8.  PA 54/25 CVP 15 CI 2.7 Co-ox 54% this am and 63% this afternoon.  On milrinone 0.5, epi 2, NE 2.  Diuresing well now on lasix 12. Lasix turned up to 20 this afternoon and dumped 1L in an hr. Turned back down after episode of VF.  LVAD INTERROGATION:  HeartMate IIl LVAD:  Flow 3.7 liters/min, speed 5200, power 4.0, PI 4.3.     Objective:    Vital Signs:   Temp:  [100.2 F (37.9 C)-101.1 F (38.4 C)] 100.4 F (38 C) (06/24 1606) Pulse Rate:  [32-113] 75 (06/24 1606) Resp:  [13-40] 17 (06/24 1606) BP: (54-124)/(36-99) 111/99 (06/24 1600) SpO2:  [93 %-100 %] 99 % (06/24 1606) Arterial Line BP: (73-150)/(57-80) 102/71 (06/24 1745) FiO2 (%):  [40 %] 40 % (06/24 1542) Weight:  [120.5 kg (265 lb 10.5 oz)-122.3 kg (269 lb 10 oz)] 120.5 kg (265 lb 10.5 oz) (06/24 1415) Last BM Date: (smear) Mean arterial Pressure 80's  Intake/Output:   Intake/Output Summary (Last 24 hours) at 08/23/2017 1806 Last data filed at 08/23/2017 1800 Gross per 24 hour  Intake 5703.84 ml  Output 7266 ml  Net -1562.16 ml     Physical Exam: General:  Intubated and sedated on vent, eyes open. HEENT: normal Cor: Distant irregular heart sounds with LVAD hum present. Lungs: coarse bilat Abdomen: soft, nontender, mildly distended. No hepatosplenomegaly. No bruits or masses. Good bowel sounds. driveline dressing intact with anchor. Extremities: anasarca Neuro: alert & orientedx3, cranial nerves grossly intact. moves all 4 extremities w/o difficulty. Affect  pleasant  Telemetry: atrial fib 90's on amio drip  Labs: Basic Metabolic Panel: Recent Labs  Lab 08/19/17 0443  08/20/17 0429  08/21/17 0335 08/21/17 1116 08/21/17 1541 08/22/17 0406 08/23/17 0412 08/23/17 1342  NA 135   < > 134*   < > 132*  --  132* 133* 135 134*  K 3.3*   < > 3.9   < > 3.7  --  3.8 4.0 3.3* 3.0*  CL 104   < > 102   < > 100*  --  101 102 98* 99*  CO2 24  --  24   < > 23  --  '23 22 25 24  ' GLUCOSE 160*   < > 197*   < > 238*  --  291* 232* 218* 234*  BUN 7   < > 5*   < > 11  --  13 18 23* 27*  CREATININE 0.89   < > 0.86   < > 0.93  --  0.97 0.98 1.05 1.14  CALCIUM 8.4*  --  8.3*   < > 8.2*  --  8.0* 8.0* 8.4* 8.3*  MG 1.5*  --  1.6*  --   --  1.9  --  2.0 1.8  --   PHOS 2.9  --  3.0  --  2.7  --   --  2.4* 2.4*  --    < > = values in this interval not displayed.  Liver Function Tests: Recent Labs  Lab 08/19/17 0443 08/20/17 0429 08/21/17 0335 08/22/17 0406 08/23/17 0412  AST 48* 53* 45* 48* 110*  ALT 16* 16* 17 18 44  ALKPHOS 49 46 47 58 67  BILITOT 1.6* 1.9* 1.3* 1.2 1.4*  PROT 5.7* 5.7* 6.1* 6.0* 6.9  ALBUMIN 2.6* 2.7* 2.4* 2.3* 2.3*   No results for input(s): LIPASE, AMYLASE in the last 168 hours. No results for input(s): AMMONIA in the last 168 hours.  CBC: Recent Labs  Lab 08/19/17 0443  08/20/17 0429 08/21/17 0335 08/21/17 1957 08/22/17 0406 08/23/17 0412  WBC 10.0  --  9.1 7.8 8.5 8.8 13.7*  NEUTROABS 8.0*  --  7.7 6.0  --  6.5 11.0*  HGB 8.6*   < > 8.3* 7.9* 8.3* 8.4* 8.5*  HCT 27.0*   < > 26.4* 24.7* 26.4* 26.4* 26.5*  MCV 85.4  --  85.2 83.7 85.2 84.9 82.6  PLT 115*  --  134* 147* 178 194 261   < > = values in this interval not displayed.    INR: Recent Labs  Lab 08/19/17 0443 08/20/17 0429 08/21/17 0335 08/22/17 0406 08/23/17 0412  INR 1.57 1.37 1.42 1.62 2.64    Other results:  EKG:   Imaging: Dg Chest Port 1 View  Result Date: 08/23/2017 CLINICAL DATA:  Follow-up LVAD EXAM: PORTABLE CHEST 1 VIEW  COMPARISON:  08/22/2017 FINDINGS: Cardiac shadow is enlarged. Defibrillator and left ventricular assist device are again seen and stable. Endotracheal tube and nasogastric catheter are noted in satisfactory position. Swan-Ganz catheter and right jugular central line are again seen and stable. Left thoracostomy catheter and mediastinal drain are stable in appearance. Diffuse vascular congestion with interstitial changes are again noted. Small effusions are again seen and stable. IMPRESSION: Stable appearance of the chest with findings of CHF and interstitial edema. Tubes and lines are stable. Electronically Signed   By: Inez Catalina M.D.   On: 08/23/2017 08:16   Dg Chest Port 1 View  Result Date: 08/22/2017 CLINICAL DATA:  LVAD. EXAM: PORTABLE CHEST 1 VIEW COMPARISON:  08/21/2017 FINDINGS: LVAD unchanged. Multiple tubes and lines without significant change including endotracheal tube, nasogastric tube, right IJ Swan-Ganz catheter, right IJ central venous catheter, mediastinal drain and left chest tube. A second enteric tube is also unchanged. Lungs are somewhat hypoinflated demonstrate slight interval worsening of hazy perihilar opacification likely interstitial edema. Suggestion of small amount of bilateral pleural fluid likely with associated basilar atelectasis. Stable cardiomegaly. No pneumothorax. Remainder the exam is unchanged. IMPRESSION: Slight interval worsening of findings likely representing interstitial edema. Suggestion of small amount of bilateral pleural fluid likely with associated basilar atelectasis Stable cardiomegaly. Multiple tubes and lines as described unchanged. Electronically Signed   By: Marin Olp M.D.   On: 08/22/2017 07:12     Medications:     Scheduled Medications: . [START ON 08/24/2017] aspirin  81 mg Oral Daily  . bisacodyl  10 mg Oral Daily   Or  . bisacodyl  10 mg Rectal Daily  . chlorhexidine gluconate (MEDLINE KIT)  15 mL Mouth Rinse BID  . Chlorhexidine  Gluconate Cloth  6 each Topical Daily  . feeding supplement (PRO-STAT SUGAR FREE 64)  30 mL Per Tube BID  . insulin aspart  0-24 Units Subcutaneous Q4H  . insulin detemir  30 Units Subcutaneous BID  . levalbuterol  0.63 mg Nebulization TID  . mouth rinse  15 mL Mouth Rinse 10 times per day  . metoCLOPramide (REGLAN) injection  10 mg Intravenous Q6H  . metolazone  5 mg Oral BID  . potassium chloride  40 mEq Oral Daily  . sodium chloride flush  10-40 mL Intracatheter Q12H  . Warfarin - Pharmacist Dosing Inpatient   Does not apply q1800    Infusions: . sodium chloride Stopped (08/23/17 1110)  . sodium chloride    . sodium chloride 10 mL/hr at 08/21/17 0200  . sodium chloride Stopped (08/23/17 1000)  . amiodarone 30 mg/hr (08/23/17 1800)  . dexmedetomidine (PRECEDEX) IV infusion 1.2 mcg/kg/hr (08/23/17 1800)  . EPINEPHrine 4 mg in dextrose 5% 250 mL infusion (16 mcg/mL) 2 mcg/min (08/23/17 1800)  . famotidine (PEPCID) IV Stopped (08/23/17 1740)  . feeding supplement (VITAL HIGH PROTEIN) 1,000 mL (08/23/17 0217)  . fentaNYL infusion INTRAVENOUS 400 mcg/hr (08/23/17 1800)  . furosemide (LASIX) infusion 12 mg/hr (08/23/17 1800)  . lactated ringers Stopped (08/18/17 0933)  . lactated ringers 10 mL/hr at 08/23/17 1800  . lactated ringers Stopped (08/19/17 1602)  . levofloxacin (LEVAQUIN) IV    . milrinone 0.5 mcg/kg/min (08/23/17 1800)  . norepinephrine (LEVOPHED) Adult infusion 2 mcg/min (08/23/17 1800)  . potassium chloride 50 mL/hr at 08/23/17 1800    PRN Medications: sodium chloride, acetaminophen, fentaNYL, hydrALAZINE, lactated ringers, midazolam, morphine injection, ondansetron (ZOFRAN) IV, oxyCODONE, sodium chloride flush   Assessment/Plan/Discussion:    POD 7 HM 3 LVAD for mixed ischemic and non-ischemic CM with acute on chronic systolic biventricular heart failure and cardiogenic shock. Sternum initially left open and closed 08/19/17. Hemodynamics have been fairly stable on  current support with some HTN at times with agitation. Continue present support.  VT/VF: on IV amio. Episode of VF today could be due to large diuresis and low K+.  Marked volume overload with anasarca. CXR shows pulmonary edema that may be slightly better today. He is not ready for vent wean yet. He is diuresing well now. Will need to hold off on vent wean until volume is off and CXR improved.  Fever to 100.9 today and 100+ all day. WBC up slightlyHe is at high risk for sepsis given his course and CXR appearance with pulmonary edema could easily evolve into pneumonia. Will continue Levaquin empirically since he is PCN allergic. Hold off on vanc for now but may need to add if WBC continues to rise with fever. He has multiple potential sources.  Coumadin and Heparin per pharmacy. Therapeutic.   Tube feeds started and tolerating well.   Chest tube output low. May be able to remove tomorrow.  Discussed status and plans with his wife at bedside.  I reviewed the LVAD parameters from today, and compared the results to the patient's prior recorded data.  No programming changes were made.  The LVAD is functioning within specified parameters.   LVAD interrogation was negative for any significant power changes, alarms or PI events/speed drops.  LVAD equipment check completed and is in good working order.  Back-up equipment present.   LVAD education done on emergency procedures and precautions and reviewed exit site care.  Length of Stay: [address]  Gaye Pollack 08/23/2017, 6:06 PM

## 2017-08-23 NOTE — Progress Notes (Signed)
CSW met with patient's wife. She reports patient continues with ventilator support and at times appears awake and restless. Wife learning the dressing changes and participating in patient care. She reports she has not been home for a few days but is hopeful to go home later to re group and will return to bedside later tonight. CSW provided supportive intervention and will continue to follow throughout implant hospitalization. Raquel Sarna, [place], [place]

## 2017-08-23 NOTE — Progress Notes (Signed)
PT Cancellation Note  Patient Details Name: [name] [name] MRN: [id number] DOB: [birth year]   Cancelled Treatment:    Reason Eval/Treat Not Completed: Medical issues which prohibited therapy. Pt remains heavily sedated and isn't following commands this AM. RN asked to return to tomorrow. Acute PT to return as able, as appropriate to complete PT eval and progress mobility.  Kittie Plater, PT, DPT Pager #: [phone] Office #: [phone]    Berline Lopes 08/23/2017, 7:58 AM

## 2017-08-24 ENCOUNTER — Inpatient Hospital Stay (HOSPITAL_COMMUNITY): Payer: PPO

## 2017-08-24 LAB — COMPREHENSIVE METABOLIC PANEL
ALT: 93 U/L — ABNORMAL HIGH (ref 0–44)
AST: 223 U/L — ABNORMAL HIGH (ref 15–41)
Albumin: 2.2 g/dL — ABNORMAL LOW (ref 3.5–5.0)
Alkaline Phosphatase: 71 U/L (ref 38–126)
Anion gap: 10 (ref 5–15)
BUN: 33 mg/dL — ABNORMAL HIGH (ref 8–23)
CO2: 27 mmol/L (ref 22–32)
Calcium: 8.4 mg/dL — ABNORMAL LOW (ref 8.9–10.3)
Chloride: 98 mmol/L (ref 98–111)
Creatinine, Ser: 1.18 mg/dL (ref 0.61–1.24)
GFR calc Af Amer: >60 mL/min (ref >60)
GFR calc non Af Amer: >60 mL/min (ref >60)
Glucose, Bld: 175 mg/dL — ABNORMAL HIGH (ref 70–99)
Potassium: 3.2 mmol/L — ABNORMAL LOW (ref 3.5–5.1)
Sodium: 135 mmol/L (ref 135–145)
Total Bilirubin: 1.4 mg/dL — ABNORMAL HIGH (ref 0.3–1.2)
Total Protein: 6.6 g/dL (ref 6.5–8.1)

## 2017-08-24 LAB — BASIC METABOLIC PANEL
ANION GAP: 11 (ref 5–15)
BUN: 40 mg/dL — ABNORMAL HIGH (ref 8–23)
CALCIUM: 8.2 mg/dL — AB (ref 8.9–10.3)
CHLORIDE: 97 mmol/L — AB (ref 98–111)
CO2: 28 mmol/L (ref 22–32)
Creatinine, Ser: 1.23 mg/dL (ref 0.61–1.24)
GFR calc non Af Amer: 59 mL/min — ABNORMAL LOW (ref >60)
Glucose, Bld: 180 mg/dL — ABNORMAL HIGH (ref 70–99)
Potassium: 3.3 mmol/L — ABNORMAL LOW (ref 3.5–5.1)
SODIUM: 136 mmol/L (ref 135–145)

## 2017-08-24 LAB — GLUCOSE, CAPILLARY
GLUCOSE-CAPILLARY: 159 mg/dL — AB (ref 70–99)
GLUCOSE-CAPILLARY: 174 mg/dL — AB (ref 70–99)
GLUCOSE-CAPILLARY: 219 mg/dL — AB (ref 70–99)
Glucose-Capillary: 171 mg/dL — ABNORMAL HIGH (ref 70–99)
Glucose-Capillary: 205 mg/dL — ABNORMAL HIGH (ref 70–99)

## 2017-08-24 LAB — COOXEMETRY PANEL
CARBOXYHEMOGLOBIN: 1.4 % (ref 0.5–1.5)
Carboxyhemoglobin: 1.5 % (ref 0.5–1.5)
Carboxyhemoglobin: 1.5 % (ref 0.5–1.5)
Methemoglobin: 0.8 % (ref 0.0–1.5)
Methemoglobin: 0.9 % (ref 0.0–1.5)
Methemoglobin: 1.7 % — ABNORMAL HIGH (ref 0.0–1.5)
O2 Saturation: 39.3 %
O2 Saturation: 38.9 %
O2 Saturation: 56.7 %
Total hemoglobin: 8.6 g/dL — ABNORMAL LOW (ref 12.0–16.0)
Total hemoglobin: 7.9 g/dL — ABNORMAL LOW (ref 12.0–16.0)
Total hemoglobin: 7.9 g/dL — ABNORMAL LOW (ref 12.0–16.0)

## 2017-08-24 LAB — CBC
HCT: 25.2 % — ABNORMAL LOW (ref 39.0–52.0)
Hemoglobin: 8.1 g/dL — ABNORMAL LOW (ref 13.0–17.0)
MCH: 26.8 pg (ref 26.0–34.0)
MCHC: 32.1 g/dL (ref 30.0–36.0)
MCV: 83.4 fL (ref 78.0–100.0)
Platelets: 329 10*3/uL (ref 150–400)
RBC: 3.02 MIL/uL — ABNORMAL LOW (ref 4.22–5.81)
RDW: 16.3 % — ABNORMAL HIGH (ref 11.5–15.5)
WBC: 14.3 10*3/uL — ABNORMAL HIGH (ref 4.0–10.5)

## 2017-08-24 LAB — BLOOD GAS, ARTERIAL
Acid-Base Excess: 4.6 mmol/L — ABNORMAL HIGH (ref 0.0–2.0)
Bicarbonate: 28.2 mmol/L — ABNORMAL HIGH (ref 20.0–28.0)
FIO2: 40
MECHVT: 600 mL
Nitric Oxide: 10
O2 Saturation: 94.6 %
PEEP: 5 cmH2O
Patient temperature: 98.6
RATE: 16 resp/min
pCO2 arterial: 39.5 mmHg (ref 32.0–48.0)
pH, Arterial: 7.468 — ABNORMAL HIGH (ref 7.350–7.450)
pO2, Arterial: 68.4 mmHg — ABNORMAL LOW (ref 83.0–108.0)

## 2017-08-24 LAB — URINALYSIS, ROUTINE W REFLEX MICROSCOPIC
BILIRUBIN URINE: NEGATIVE
GLUCOSE, UA: NEGATIVE mg/dL
Hgb urine dipstick: NEGATIVE
KETONES UR: NEGATIVE mg/dL
LEUKOCYTES UA: NEGATIVE
Nitrite: NEGATIVE
PH: 5 (ref 5.0–8.0)
PROTEIN: NEGATIVE mg/dL
Specific Gravity, Urine: 1.008 (ref 1.005–1.030)

## 2017-08-24 LAB — PROTIME-INR
INR: 2.97
Prothrombin Time: 30.7 seconds — ABNORMAL HIGH (ref 11.4–15.2)

## 2017-08-24 LAB — PREPARE RBC (CROSSMATCH)

## 2017-08-24 LAB — MAGNESIUM: Magnesium: 2.1 mg/dL (ref 1.7–2.4)

## 2017-08-24 LAB — LACTATE DEHYDROGENASE: LDH: 384 U/L — ABNORMAL HIGH (ref 98–192)

## 2017-08-24 MED ORDER — ALBUMIN HUMAN 25 % IV SOLN
12.5000 g | Freq: Four times a day (QID) | INTRAVENOUS | Status: AC
  Administered 2017-08-24 – 2017-08-25 (×2): 12.5 g via INTRAVENOUS
  Filled 2017-08-24 (×2): qty 50

## 2017-08-24 MED ORDER — POTASSIUM CHLORIDE 20 MEQ/15ML (10%) PO SOLN
40.0000 meq | Freq: Three times a day (TID) | ORAL | Status: DC
  Administered 2017-08-24 (×2): 40 meq via ORAL
  Filled 2017-08-24 (×3): qty 30

## 2017-08-24 MED ORDER — POTASSIUM CHLORIDE 10 MEQ/50ML IV SOLN
10.0000 meq | INTRAVENOUS | Status: AC | PRN
  Administered 2017-08-24 (×3): 10 meq via INTRAVENOUS
  Filled 2017-08-24 (×3): qty 50

## 2017-08-24 MED ORDER — SODIUM CHLORIDE 0.9% IV SOLUTION
Freq: Once | INTRAVENOUS | Status: AC
  Administered 2017-08-24: 16:00:00 via INTRAVENOUS

## 2017-08-24 MED ORDER — VANCOMYCIN HCL IN DEXTROSE 750-5 MG/150ML-% IV SOLN
750.0000 mg | Freq: Two times a day (BID) | INTRAVENOUS | Status: DC
Start: 2017-08-25 — End: 2017-08-27
  Administered 2017-08-25 – 2017-08-27 (×5): 750 mg via INTRAVENOUS
  Filled 2017-08-24 (×7): qty 150

## 2017-08-24 MED ORDER — MEROPENEM 1 G IV SOLR
1.0000 g | Freq: Three times a day (TID) | INTRAVENOUS | Status: AC
  Administered 2017-08-24 – 2017-08-30 (×20): 1 g via INTRAVENOUS
  Filled 2017-08-24 (×21): qty 1

## 2017-08-24 MED ORDER — ENSURE ENLIVE PO LIQD
237.0000 mL | Freq: Once | ORAL | Status: AC
  Administered 2017-08-24: 237 mL

## 2017-08-24 MED ORDER — SPIRONOLACTONE 12.5 MG HALF TABLET
12.5000 mg | ORAL_TABLET | Freq: Every day | ORAL | Status: DC
  Administered 2017-08-24: 12.5 mg via ORAL
  Filled 2017-08-24 (×2): qty 1

## 2017-08-24 MED ORDER — VANCOMYCIN HCL 10 G IV SOLR
2000.0000 mg | Freq: Once | INTRAVENOUS | Status: AC
  Administered 2017-08-24: 2000 mg via INTRAVENOUS
  Filled 2017-08-24: qty 2000

## 2017-08-24 NOTE — Progress Notes (Signed)
CSW visited bedside although no family present at the moment. Patient intubated. CSW left message for wife via cell phone and offered support. Raquel Sarna, [place], [place]

## 2017-08-24 NOTE — Progress Notes (Signed)
PT Cancellation Note  Patient Details Name: [name] [name] MRN: [id number] DOB: May 16, 1950   Cancelled Treatment:    Reason Eval/Treat Not Completed: Patient not medically ready(pt remains intubated and not appropriate. Will sign off. Please reorder when pt medically stable for evaluation)   [name] [name] 08/24/2017, 7:03 AM  Elwyn Reach, [place]

## 2017-08-24 NOTE — Progress Notes (Signed)
[place] for coumadin Indication: LVAD  Allergies  Allergen Reactions  . Penicillins Hives, Itching and Other (See Comments)    Has patient had a PCN reaction causing immediate rash, facial/tongue/throat swelling, SOB or lightheadedness with hypotension:# # Yes # # Has patient had a PCN reaction causing severe rash involving mucus membranes or skin necrosis: No Has patient had a PCN reaction that required hospitalization: No Has patient had a PCN reaction occurring within the last 10 years: No If all of the above answers are "NO", then may proceed with Cephalosporin use.     Patient Measurements: Height: 5' 10.98" (180.3 cm) Weight: 261 lb 3.9 oz (118.5 kg) IBW/kg (Calculated) : 75.26 Heparin Dosing Weight: 98.3 kg  Vital Signs: Temp: 100 F (37.8 C) (06/25 0900) Temp Source: Core (06/25 0800) BP: 94/73 (06/25 0836) Pulse Rate: 60 (06/25 0900)  Labs: Recent Labs    08/22/17 0406 08/22/17 1644 08/23/17 0412 08/23/17 1342 08/23/17 1808 08/24/17 0352  HGB 8.4*  --  8.5*  --   --  8.1*  HCT 26.4*  --  26.5*  --   --  25.2*  PLT 194  --  261  --   --  329  LABPROT 19.1*  --  28.0*  --   --  30.7*  INR 1.62  --  2.64  --   --  2.97  HEPARINUNFRC 0.27* 0.35 0.27*  --   --   --   CREATININE 0.98  --  1.05 1.14 1.10 1.18    Estimated Creatinine Clearance: 79.6 mL/min (by C-G formula based on SCr of 1.18 mg/dL).   Medical History: Past Medical History:  Diagnosis Date  . AICD (automatic cardioverter/defibrillator) present 02/05/2014   Upgrade to Medtronic biventricular ICD, serial number  [id number]   . Atrial flutter ([place]) 04/2012   s/p TEE-EPS+RFCA 04/2012  . CAD (coronary artery disease) 1950,9326 X 2    RCA-T, 70% PL (off CFX), 99% Prox LAD/90% Dist LAD, S/P TAXUS stent x 2  . CHF (congestive heart failure) ([place])   . Chronic anticoagulation   . Chronic systolic heart failure ([place])   . CKD (chronic kidney disease)   .  Diabetic retinopathy ([place])   . DM type 2 (diabetes mellitus, type 2) (H[facility])    insulin dependent  . HTN (hypertension)   . Hypercholesteremia    ablation  . ICD (implantable cardiac defibrillator) in place   . Ischemic cardiomyopathy March 2015   20-25% 2D   . Nephrolithiasis   . Ventricular tachycardia (HCC)     Medications:  Scheduled:  . aspirin  81 mg Oral Daily  . bisacodyl  10 mg Oral Daily   Or  . bisacodyl  10 mg Rectal Daily  . chlorhexidine gluconate (MEDLINE KIT)  15 mL Mouth Rinse BID  . Chlorhexidine Gluconate Cloth  6 each Topical Daily  . feeding supplement (ENSURE ENLIVE)  237 mL Per Tube Once  . feeding supplement (PRO-STAT SUGAR FREE 64)  30 mL Per Tube BID  . insulin aspart  0-24 Units Subcutaneous Q4H  . insulin detemir  30 Units Subcutaneous BID  . levalbuterol  0.63 mg Nebulization TID  . mouth rinse  15 mL Mouth Rinse 10 times per day  . metoCLOPramide (REGLAN) injection  10 mg Intravenous Q6H  . metolazone  5 mg Oral BID  . potassium chloride  40 mEq Oral TID  . sodium chloride flush  10-40 mL Intracatheter Q12H  .  Warfarin - Pharmacist Dosing Inpatient   Does not apply q1800    Assessment: 32 yom who underwent LVAD placement on 6/17. Heparin infusion has been running at 900 units/hr per physician. Underwent sternal closure yesterday on 6/20. Pharmacy resume heparin dosing post-procedure.  Heparin was stopped yesterday due to INR 2.64 after 3 doses of warfarin. Received reduced dose of 0.5 mg and 1 ensure supplement yesterday. Today INR increase to 2.97. Hgb drifting down to 8.1, plt 329. LDH stable. No s/sx of bleeding. On concurrent amiodarone IV and levofloxacin, which can impact INR sensitivity.   Goal of Therapy:  INR goal 2-2.5 Monitor platelets by anticoagulation protocol: Yes   Plan:  Will order 1 dose of ensure.  Will hold warfarin tonight   Monitor daily INR, CBC, and s/sx of bleeding.  Doylene Canard, PharmD Clinical Pharmacist   Pager: [phone] Phone: [phone]  08/24/2017 10:29 AM

## 2017-08-24 NOTE — Progress Notes (Signed)
Pharmacy Antibiotic Note  [name] [name] is a 67 y.o. male admitted on 08/11/2017 with pneumonia.  Pharmacy has been consulted for vancomycin and zosyn dosing.  Currently on day #9 of antibiotics. WBC trending upward to 14.3 today. Tmax in last 24 hours was 101.1. Scr 1.18 today (normCrCl 62 mL/min). Previous cx from tracheal aspirate and AICD lead vegetation are negative.   Plan: Vancomycin 2000 mg IV once  Vancomycin 750 mg IV every 12 hours.  Goal trough 15-20 mcg/mL. Meropenem 1 g IV every 8 hours  Monitor renal function, cx results, clinical pic, and VT prn  Height: 5' 10.98" (180.3 cm) Weight: 261 lb 3.9 oz (118.5 kg) IBW/kg (Calculated) : 75.26  Temp (24hrs), Avg:100.4 F (38 C), Min:100 F (37.8 C), Max:100.9 F (38.3 C)  Recent Labs  Lab 08/19/17 0734  08/21/17 0335  08/21/17 1957 08/22/17 0406 08/23/17 0412 08/23/17 1342 08/23/17 1808 08/24/17 0352  WBC  --    < > 7.8  --  8.5 8.8 13.7*  --   --  14.3*  CREATININE  --    < > 0.93   < >  --  0.98 1.05 1.14 1.10 1.18  VANCOTROUGH 12*  --   --   --   --   --   --   --   --   --    < > = values in this interval not displayed.    Estimated Creatinine Clearance: 79.6 mL/min (by C-G formula based on SCr of 1.18 mg/dL).    Allergies  Allergen Reactions  . Penicillins Hives, Itching and Other (See Comments)    Has patient had a PCN reaction causing immediate rash, facial/tongue/throat swelling, SOB or lightheadedness with hypotension:# # Yes # # Has patient had a PCN reaction causing severe rash involving mucus membranes or skin necrosis: No Has patient had a PCN reaction that required hospitalization: No Has patient had a PCN reaction occurring within the last 10 years: No If all of the above answers are "NO", then may proceed with Cephalosporin use.     Antimicrobials this admission: Vancomycin 6/17 >> 6/21, 6/25>> Levofloxacin 6/17 >> 6/24 Meropenem 6/25 >>  Dose adjustments this admission: VR on 6/20 = 12  >>> changed from 750 mg q 12 to 1g q12  Microbiology results: Trach aspirate 6/25: sent BCx x2 6/25: sent ACID lead veg 6/17: NGTD Trach aspirate 6/22: NGTD  Thank you for allowing pharmacy to be a part of this patient's care.  Doylene Canard, PharmD Clinical Pharmacist  Pager: [phone] Phone: [phone] 08/24/2017 2:47 PM

## 2017-08-24 NOTE — Progress Notes (Signed)
Stopped in and visited with [name] [name] as she sits in the room reading.  She is very pleasant to talk with and is full of faith believing for recovery for her husband though she feels it is happening slowly.  She is grateful as am I for the medical team and how well she is being taken care of.  We talked and chatted about the Lord and about [name] getting this done before he got any sicker.  I am grateful for our conversation.  Thank you to the team caring for them.    08/24/17 1041  Clinical Encounter Type  Visited With Patient;Family  Visit Type Follow-up;Spiritual support;Social support;Post-op  Referral From Chaplain  Consult/Referral To Other (Comment) (Rounding)

## 2017-08-24 NOTE — Progress Notes (Addendum)
Patient ID: [name] [name], male   DOB: 23-Oct-1950, 67 y.o.   MRN: [id number]     Advanced Heart Failure Rounding Note  PCP-Cardiologist: No primary care provider on file.   Subjective:    Events 6/17 Underwent HM-3 implant -> Unable to close chest with RV failure and high intrathoracic pressure 6/18 Taken back to OR for evacuation of hematoma. NO RVAD needed. Left sternum open  08/19/17 Taken back to OR for sternal closure 6/22 VT => amiodarone started.  6/24 VT/VF=>urgent cardioversion.   Remains intubated/sedated.   On milrinone 0.5/EPI 2 mcg , lasix 12 mg per hour. NO at 10 ppm.  [place] CVP 16-17  PA 50/22  CO 5.4  CI 2.4   Co-ox 40%.   LVAD Interrogation HM 3: Speed: 5200 Flow: 3.9   PI: 4.1  Power: 4    Objective:   Weight Range: 261 lb 3.9 oz (118.5 kg) Body mass index is 36.45 kg/m.   Vital Signs:   Temp:  [100 F (37.8 C)-101.1 F (38.4 C)] 100 F (37.8 C) (06/25 0900) Pulse Rate:  [32-106] 60 (06/25 0900) Resp:  [12-40] 15 (06/25 0900) BP: (54-124)/(36-99) 94/73 (06/25 0836) SpO2:  [89 %-100 %] 95 % (06/25 0900) Arterial Line BP: (67-136)/(57-79) 95/62 (06/25 0900) FiO2 (%):  [40 %] 40 % (06/25 0836) Weight:  [261 lb 3.9 oz (118.5 kg)-265 lb 10.5 oz (120.5 kg)] 261 lb 3.9 oz (118.5 kg) (06/25 0600) Last BM Date: (smear)  Weight change: Filed Weights   08/23/17 0306 08/23/17 1415 08/24/17 0600  Weight: 269 lb 10 oz (122.3 kg) 265 lb 10.5 oz (120.5 kg) 261 lb 3.9 oz (118.5 kg)    Intake/Output:   Intake/Output Summary (Last 24 hours) at 08/24/2017 1000 Last data filed at 08/24/2017 0800 Gross per 24 hour  Intake 4957.29 ml  Output 5936 ml  Net -978.71 ml      Physical Exam   CVP 16-17 Physical Exam: GENERAL: In bed. No acute distress. HEENT: ETT intubated/sedated NECK: Supple, JVP  To jaw .  2+ bilaterally, no bruits.  No lymphadenopathy or thyromegaly appreciated.   CARDIAC:  Mechanical heart sounds with LVAD hum present.  LUNGS:  Clear  to auscultation bilaterally.  ABDOMEN:  Soft, round, nontender, positive bowel sounds x4.     LVAD exit site:  Dressing dry and intact.  No erythema or drainage.  Stabilization device present and accurately applied.  Driveline dressing is being changed daily per sterile technique. EXTREMITIES:  Warm and dry, no cyanosis, clubbing, rash. R and LLE SCDs.  NEUROLOGIC: intubated/sedated.   Telemetry   VT/VF 6/24. . A fib with NSVT over night.   Labs    CBC Recent Labs    08/22/17 0406 08/23/17 0412 08/24/17 0352  WBC 8.8 13.7* 14.3*  NEUTROABS 6.5 11.0*  --   HGB 8.4* 8.5* 8.1*  HCT 26.4* 26.5* 25.2*  MCV 84.9 82.6 83.4  PLT 194 261 409   Basic Metabolic Panel Recent Labs    08/22/17 0406 08/23/17 0412  08/23/17 1808 08/24/17 0352  NA 133* 135   < > 134* 135  K 4.0 3.3*   < > 3.6 3.2*  CL 102 98*   < > 96* 98  CO2 22 25   < > 25 27  GLUCOSE 232* 218*   < > 175* 175*  BUN 18 23*   < > 30* 33*  CREATININE 0.98 1.05   < > 1.10 1.18  CALCIUM 8.0* 8.4*   < >  8.4* 8.4*  MG 2.0 1.8  --   --  2.1  PHOS 2.4* 2.4*  --   --   --    < > = values in this interval not displayed.   Liver Function Tests Recent Labs    08/23/17 0412 08/24/17 0352  AST 110* 223*  ALT 44 93*  ALKPHOS 67 71  BILITOT 1.4* 1.4*  PROT 6.9 6.6  ALBUMIN 2.3* 2.2*   No results for input(s): LIPASE, AMYLASE in the last 72 hours. Cardiac Enzymes No results for input(s): CKTOTAL, CKMB, CKMBINDEX, TROPONINI in the last 72 hours.  BNP: BNP (last 3 results) Recent Labs    08/11/17 1438 08/17/17 0314 08/23/17 0024  BNP 529.3* 266.4* 450.5*    ProBNP (last 3 results) Recent Labs    11/12/16 1559  PROBNP 388.0*     D-Dimer No results for input(s): DDIMER in the last 72 hours. Hemoglobin A1C No results for input(s): HGBA1C in the last 72 hours. Fasting Lipid Panel No results for input(s): CHOL, HDL, LDLCALC, TRIG, CHOLHDL, LDLDIRECT in the last 72 hours. Thyroid Function Tests No results  for input(s): TSH, T4TOTAL, T3FREE, THYROIDAB in the last 72 hours.  Invalid input(s): FREET3  Other results:   Imaging    Dg Chest Port 1 View  Result Date: 08/24/2017 CLINICAL DATA:  Follow-up LVAD EXAM: PORTABLE CHEST 1 VIEW COMPARISON:  08/23/2017 FINDINGS: Cardiac shadow remains enlarged. Postsurgical changes are again seen. Left ventricular assist device and defibrillator are again noted and stable. Swan-Ganz catheter, endotracheal tube and nasogastric catheter are also stable in appearance. Right jugular catheter and feeding catheter are again noted and stable. Left thoracostomy catheter remains in place without pneumothorax. Increased central markings consistent with vascular congestion are again identified and stable. No new focal abnormality is noted. IMPRESSION: The overall appearance of the chest is stable from the previous day. Electronically Signed   By: Inez Catalina M.D.   On: 08/24/2017 09:09     Medications:     Scheduled Medications: . aspirin  81 mg Oral Daily  . bisacodyl  10 mg Oral Daily   Or  . bisacodyl  10 mg Rectal Daily  . chlorhexidine gluconate (MEDLINE KIT)  15 mL Mouth Rinse BID  . Chlorhexidine Gluconate Cloth  6 each Topical Daily  . feeding supplement (PRO-STAT SUGAR FREE 64)  30 mL Per Tube BID  . insulin aspart  0-24 Units Subcutaneous Q4H  . insulin detemir  30 Units Subcutaneous BID  . levalbuterol  0.63 mg Nebulization TID  . mouth rinse  15 mL Mouth Rinse 10 times per day  . metoCLOPramide (REGLAN) injection  10 mg Intravenous Q6H  . metolazone  5 mg Oral BID  . potassium chloride  40 mEq Oral Daily  . sodium chloride flush  10-40 mL Intracatheter Q12H  . Warfarin - Pharmacist Dosing Inpatient   Does not apply q1800    Infusions: . sodium chloride 10 mL/hr at 08/24/17 0800  . sodium chloride    . sodium chloride 10 mL/hr at 08/21/17 0200  . sodium chloride 10 mL/hr at 08/24/17 0800  . amiodarone 30 mg/hr (08/24/17 0800)  .  dexmedetomidine (PRECEDEX) IV infusion 1.2 mcg/kg/hr (08/24/17 0800)  . EPINEPHrine 4 mg in dextrose 5% 250 mL infusion (16 mcg/mL) 2 mcg/min (08/24/17 0800)  . famotidine (PEPCID) IV Stopped (08/23/17 2308)  . feeding supplement (VITAL HIGH PROTEIN) 1,000 mL (08/24/17 0234)  . fentaNYL infusion INTRAVENOUS 400 mcg/hr (08/24/17 0800)  . furosemide (  LASIX) infusion 12 mg/hr (08/24/17 0800)  . lactated ringers Stopped (08/18/17 0933)  . lactated ringers 10 mL/hr at 08/24/17 0800  . lactated ringers Stopped (08/19/17 1602)  . levofloxacin (LEVAQUIN) IV Stopped (08/23/17 2228)  . milrinone 0.5 mcg/kg/min (08/24/17 0800)  . norepinephrine (LEVOPHED) Adult infusion 5 mcg/min (08/24/17 0800)    PRN Medications: sodium chloride, acetaminophen, fentaNYL, hydrALAZINE, lactated ringers, midazolam, morphine injection, ondansetron (ZOFRAN) IV, oxyCODONE, sodium chloride flush    Patient Profile  UPTON RUSSEY is a 67 y.o. 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   Directly admitted with persistent low cardiac output for milrinone initiation for home.  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 Acute/Chronic systolic CHF with biventricular failure-> cardiogenic shock: Echo  08/13/2017 EF 20-25%. s/p  Medtronic BiV ICD in place. Cath 12/18 with stable 1v CAD.  s/p HM-3 implant 6/17. Unable to close chest due to high-intrathoracic pressures and RV failure. Taken back to OR 08/17/17 with evacuation of hematoma with improvement.   - Remains intubated/sedated. On milrinone 0.5/EPI  2 mcg , lasix 12 mg per hour. Would not extubate until volume status improves.  -CO-OX low. Hgb low. Repeat CO-OX now.  Remains volume overload. Still at least 10 pounds above his baseline. Continue lasix drip at 12 mg per hour. Yesterday lasix drip was cut back to due to VT/VF thought to be  from low K/Mag.  - Add 12.5 mg spiro to help potassium loss. - - VAD interrogated personally. Parameters stable.  - INR 2.9 . Discussed with pharmacy.  2. VAD - s/p HM-3 implant 6/17 - VAD interrogated personally. Parameters stable. - INR 2.9. Hold coumadin. Discussed wth pharmacy.   - Continue asa 81 mg daily.  - LDH 274>324 >384 3. [name]: Recent cath with stable CAD as above - No change to current plan.   4. [name]: Recent A1c 8.3 on 6/3.  - Cover with SSI in house.  4. CKD II-III:  -Creatinine stable 1.1 5. [name]/VF: Recurrent VT on 6/22 S/P VF 6/24 emergent bedside cardioversion.  - No VT over night. Continue amiodarone gtt.  - Keep K > 4.0 Mg > 2.0.  - K 3.2 Supp K  Mag 2.1  6. PAF s/p ablation 04/2012: Device interrogation showed A fib since May 16th. Remains in A fib 90s . On amio drip for rate control.  7. Anemia: Post-op.  Got 1 unit PRBCs 6/22. Hgb 8.1  Length of Stay: [place], NP  08/24/2017, 9:59 AM  Advanced Heart Failure Team Pager [phone] (M-F; [place])  Please contact [facility] for night-coverage after hours (4p -7a ) and weekends on [email or web address]  Discussed with Dr Cyndia Bent. Hgb 8.1. CO-OX low. Cardiac Index 2.4. Give 1UPRBCs now.   Amy Clegg NP-C  1:24 PM  Agree with above.   Remains intubated/sedated. On Epi 2. Milrinone 0.5 and lasix 12. NO 30. No further VT or VF. Diuresing well. Weight down 10 pounds over past 2 days (still up 20 pounds from pre-op). VAD interrogated personally. Parameters stable. CXR still wet (Personally reviewed). Still with low grade temps with Tmax 100.9. WBC 14.3 on levaquin. Co-ox low but thermo #s ok.   Exam Intubated/sedated CVP 15 ETT. Cor-trak Cor sternal dressing. +CT + VAD hum Lungs  course Ab soft NT Driveline site ok  Ext: 1-2+ edema  Remains tenuous but improving. Still markedly volume overloaded. Increase lasix gtt to 15. Remains febrile. WBC climbing on Levaquin. Will draw bcx. Switch to  meropenem. Discussed dosing with PharmD personally. VT quiescent. Continue amio. Supp K. Agree with spiro. Not ready to extubate until more fluid off. Hopefully by Thursday. Continue milrinone and epi. VAD interrogated personally. Parameters stable. D/W Dr. Cyndia Bent at bedside.   CRITICAL CARE Performed by: Glori Bickers  Total critical care time: 45 minutes  Critical care time was exclusive of separately billable procedures and treating other patients.  Critical care was necessary to treat or prevent imminent or life-threatening deterioration.  Critical care was time spent personally by me (independent of midlevel providers or residents) on the following activities: development of treatment plan with patient and/or surrogate as well as nursing, discussions with consultants, evaluation of patient's response to treatment, examination of patient, obtaining history from patient or surrogate, ordering and performing treatments and interventions, ordering and review of laboratory studies, ordering and review of radiographic studies, pulse oximetry and re-evaluation of patient's condition.  Glori Bickers, MD  2:40 PM

## 2017-08-24 NOTE — Progress Notes (Signed)
Inpatient Diabetes Program Recommendations  AACE/ADA: New Consensus Statement on Inpatient Glycemic Control (2015)  Target Ranges:  Prepandial:   less than 140 mg/dL      Peak postprandial:   less than 180 mg/dL (1-2 hours)      Critically ill patients:  140 - 180 mg/dL   Lab Results  Component Value Date   GLUCAP 219 (H) 08/24/2017   HGBA1C 8.4 (H) 08/14/2017    Review of Glycemic Control Results for [name] [name], [name] [name] (MRN [id number]) as of 08/24/2017 12:17  Ref. Range 08/23/2017 22:34 08/24/2017 00:16 08/24/2017 04:01 08/24/2017 11:27  Glucose-Capillary Latest Ref Range: 70 - 99 mg/dL 149 (H) 159 (H) 171 (H) 219 (H)   Diabetes history: Type 2 DM Outpatient Diabetes medications: Trulicity 1.5 mg Q/wk, Metformin 750 mg QAM, NPH (per Dr Jodelle Green note) 30 units BID Current orders for Inpatient glycemic control: Novolog 0-24 units Q4H, Levemir 30 units BID  Inpatient Diabetes Program Recommendations:    BS spiked to 484 mg/dL following nutritional supplement.  Consider tube feed coverage, Novolog 5 units Q4H.  Thanks, Bronson Curb, MSN, RNC-OB Diabetes Coordinator [phone] (8a-5p)

## 2017-08-24 NOTE — Progress Notes (Signed)
LVAD Coordinator Rounding Not  Admitted 08/11/17 for initiation of Milrinone. 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 He was evaluated [facility] /Dr Posey Pronto on 5/9 for possible heart transplant.   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 Prescott Gum under Destination Therapy criteria. Dr [name] discussed heart transplant candidacy with Dr Posey Pronto. Given size and blood type there was concern he would not make it to transplant.   Pt remains intubated and sedated with mittens on. Wife at bedside.   Vital signs: Temp:  100.2 HR: 101 afib Arterial line:  89/61 (72) Doppler: not done O2 Sat:  98% on 40%FiO2 Wt: 238>246>242>274>267>259>269>261lbs   LVAD interrogation reveals:  Speed: 5200 Flow: 4.1 Power:  3.6w PI:  3.2 Alarms: none in past 24 hrs Events: 7 PI event Hematocrit:  25 Fixed speed: 5200 Low speed limit: 4900  Drive Line: Driveline dressing changed by the wife with nurse observing.  Drive line anchor secure.    Labs:  LDH trend: 274>287>245>240>324>384  INR trend:  1.71>1.92>1.57>1.37>2.64>2.97  Anticoagulation Plan: -INR Goal: 2.0 - 2.5 -ASA Dose: 81 mg daily   Blood Products:  Intra op: - 08/16/17> one platelet; 2 FFP - 08/17/17> ome FFP  Post op: - 08/16/17 > one unit PCs - 08/21/17 > one unit PCs  Device:  - BiV Medtronic - Therapies: off  Respiratory: vented  Nitric Oxide:  10 ppm  Gtts: - Epi 2 mcg/min - Milrinone 0.5 mcg/kg/min - Lasix 12 mg/hr - Precedex 1.2 mcg/kg/hr - Amiodarone 30 mg/hr - Fentanyl 400 mcg/hr - Levo 5 mcg/min  Adverse Events on VAD: - 08/17/17>mediastinal reexploration with evacuation of mediastinal clot - 08/19/17>sternal closure  VAD education:  Pt remains intubated, unable to initiate education. Wife to perform dressing change today with nurse observing.  Plan/Recommendations:  1. Daily dressing changes per VAD coordinator, nurse champion, or trained caregiver.  2. Page VAD Coordinator with  any VAD equipment or drive line issues.   Tanda Rockers RN, VAD Coordinator 24/7 VAD Pager: [phone]

## 2017-08-24 NOTE — Progress Notes (Addendum)
OT Cancellation Note  Patient Details Name: [name] [name] MRN: [id number] DOB: 12-16-1950   Cancelled Treatment:    Reason Eval/Treat Not Completed: Patient not medically ready. Pt remains on ventilator, sedated, and not medically appropriate to participate with OT at this time. OT will sign off and await new order once pt more medically stable.   Norman Herrlich, MS OTR/L  Pager: [phone]   Norman Herrlich 08/24/2017, 7:18 AM

## 2017-08-24 NOTE — Progress Notes (Signed)
Patient ID: [name] [name], male   DOB: [birth year], 67 y.o.   MRN: [id number] HeartMate 3 Rounding Note  Subjective:    Remains sedated on vent.   Hemodynamics have been stable.   PA 50/22 CVP 15-17 CI 2.4 Co-ox 39%   On milrinone 0.5, epi 2, NE 4.  Diuresing well on lasix 12 but I/O still positive 700 cc today due to large intake. -1900 cc yesterday and wt down 4 lbs today.    LVAD INTERROGATION:  HeartMate IIl LVAD:  Flow 3.9 liters/min, speed 5200, power 4.0, PI 4.1.     Objective:    Vital Signs:   Temp:  [100 F (37.8 C)-100.9 F (38.3 C)] 100 F (37.8 C) (06/25 0900) Pulse Rate:  [42-104] 98 (06/25 1149) Resp:  [12-28] 17 (06/25 1149) BP: (75-114)/(49-99) 114/78 (06/25 1149) SpO2:  [89 %-100 %] 97 % (06/25 1300) Arterial Line BP: (67-136)/(57-79) 95/62 (06/25 0900) FiO2 (%):  [40 %] 40 % (06/25 1300) Weight:  [118.5 kg (261 lb 3.9 oz)] 118.5 kg (261 lb 3.9 oz) (06/25 0600) Last BM Date: (smear) Mean arterial Pressure 70's  Intake/Output:   Intake/Output Summary (Last 24 hours) at 08/24/2017 1502 Last data filed at 08/24/2017 1400 Gross per 24 hour  Intake 4237.65 ml  Output 4335 ml  Net -97.35 ml     Physical Exam: General:  Intubated and sedated on vent.  HEENT: normal Cor: Distant irregular heart sounds with LVAD hum present. Lungs: few rhonchi bilat Abdomen: soft, nontender, mildly distended. No hepatosplenomegaly. No bruits or masses.  Good bowel sounds. driveline dressing intact with anchor.Abdominal incision and drive line site ok.   Extremities: anasarca improving Neuro: Has been awake and following commands appropriately but now asleep on sedation.  Telemetry: atrial fib 90's on amio drip  Labs: Basic Metabolic Panel: Recent Labs  Lab 08/19/17 0443  08/20/17 0429  08/21/17 0335 08/21/17 1116  08/22/17 0406 08/23/17 0412 08/23/17 1342 08/23/17 1808 08/24/17 0352  NA 135   < > 134*   < > 132*  --    < > 133* 135 134* 134* 135  K  3.3*   < > 3.9   < > 3.7  --    < > 4.0 3.3* 3.0* 3.6 3.2*  CL 104   < > 102   < > 100*  --    < > 102 98* 99* 96* 98  CO2 24  --  24   < > 23  --    < > _0 GLUCOSE 160*   < > 197*   < > 238*  --    < > 232* 218* 234* 175* 175*  BUN 7   < > 5*   < > 11  --    < > 18 23* 27* 30* 33*  CREATININE 0.89   < > 0.86   < > 0.93  --    < > 0.98 1.05 1.14 1.10 1.18  CALCIUM 8.4*  --  8.3*   < > 8.2*  --    < > 8.0* 8.4* 8.3* 8.4* 8.4*  MG 1.5*  --  1.6*  --   --  1.9  --  2.0 1.8  --   --  2.1  PHOS 2.9  --  3.0  --  2.7  --   --  2.4* 2.4*  --   --   --    < > = values in this interval  not displayed.    Liver Function Tests: Recent Labs  Lab 08/20/17 0429 08/21/17 0335 08/22/17 0406 08/23/17 0412 08/24/17 0352  AST 53* 45* 48* 110* 223*  ALT 16* 17 18 44 93*  ALKPHOS 46 47 58 67 71  BILITOT 1.9* 1.3* 1.2 1.4* 1.4*  PROT 5.7* 6.1* 6.0* 6.9 6.6  ALBUMIN 2.7* 2.4* 2.3* 2.3* 2.2*   No results for input(s): LIPASE, AMYLASE in the last 168 hours. No results for input(s): AMMONIA in the last 168 hours.  CBC: Recent Labs  Lab 08/19/17 0443  08/20/17 0429 08/21/17 0335 08/21/17 1957 08/22/17 0406 08/23/17 0412 08/24/17 0352  WBC 10.0  --  9.1 7.8 8.5 8.8 13.7* 14.3*  NEUTROABS 8.0*  --  7.7 6.0  --  6.5 11.0*  --   HGB 8.6*   < > 8.3* 7.9* 8.3* 8.4* 8.5* 8.1*  HCT 27.0*   < > 26.4* 24.7* 26.4* 26.4* 26.5* 25.2*  MCV 85.4  --  85.2 83.7 85.2 84.9 82.6 83.4  PLT 115*  --  134* 147* 178 194 261 329   < > = values in this interval not displayed.    INR: Recent Labs  Lab 08/20/17 0429 08/21/17 0335 08/22/17 0406 08/23/17 0412 08/24/17 0352  INR 1.37 1.42 1.62 2.64 2.97    Other results:  EKG:   Imaging: Dg Chest Port 1 View  Result Date: 08/24/2017 CLINICAL DATA:  Follow-up LVAD EXAM: PORTABLE CHEST 1 VIEW COMPARISON:  08/23/2017 FINDINGS: Cardiac shadow remains enlarged. Postsurgical changes are again seen. Left ventricular assist device and defibrillator are  again noted and stable. Swan-Ganz catheter, endotracheal tube and nasogastric catheter are also stable in appearance. Right jugular catheter and feeding catheter are again noted and stable. Left thoracostomy catheter remains in place without pneumothorax. Increased central markings consistent with vascular congestion are again identified and stable. No new focal abnormality is noted. IMPRESSION: The overall appearance of the chest is stable from the previous day. Electronically Signed   By: Inez Catalina M.D.   On: 08/24/2017 09:09   Dg Chest Port 1 View  Result Date: 08/23/2017 CLINICAL DATA:  Follow-up LVAD EXAM: PORTABLE CHEST 1 VIEW COMPARISON:  08/22/2017 FINDINGS: Cardiac shadow is enlarged. Defibrillator and left ventricular assist device are again seen and stable. Endotracheal tube and nasogastric catheter are noted in satisfactory position. Swan-Ganz catheter and right jugular central line are again seen and stable. Left thoracostomy catheter and mediastinal drain are stable in appearance. Diffuse vascular congestion with interstitial changes are again noted. Small effusions are again seen and stable. IMPRESSION: Stable appearance of the chest with findings of CHF and interstitial edema. Tubes and lines are stable. Electronically Signed   By: Inez Catalina M.D.   On: 08/23/2017 08:16     Medications:     Scheduled Medications: . sodium chloride   Intravenous Once  . aspirin  81 mg Oral Daily  . bisacodyl  10 mg Oral Daily   Or  . bisacodyl  10 mg Rectal Daily  . chlorhexidine gluconate (MEDLINE KIT)  15 mL Mouth Rinse BID  . Chlorhexidine Gluconate Cloth  6 each Topical Daily  . feeding supplement (PRO-STAT SUGAR FREE 64)  30 mL Per Tube BID  . insulin aspart  0-24 Units Subcutaneous Q4H  . insulin detemir  30 Units Subcutaneous BID  . levalbuterol  0.63 mg Nebulization TID  . mouth rinse  15 mL Mouth Rinse 10 times per day  . metoCLOPramide (REGLAN) injection  10 mg  Intravenous Q6H   . metolazone  5 mg Oral BID  . potassium chloride  40 mEq Oral TID  . sodium chloride flush  10-40 mL Intracatheter Q12H  . spironolactone  12.5 mg Oral Daily  . Warfarin - Pharmacist Dosing Inpatient   Does not apply q1800    Infusions: . sodium chloride 10 mL/hr at 08/24/17 1400  . sodium chloride    . sodium chloride 10 mL/hr at 08/21/17 0200  . sodium chloride Stopped (08/24/17 1151)  . amiodarone 30 mg/hr (08/24/17 1400)  . dexmedetomidine (PRECEDEX) IV infusion 1.2 mcg/kg/hr (08/24/17 1411)  . EPINEPHrine 4 mg in dextrose 5% 250 mL infusion (16 mcg/mL) 2 mcg/min (08/24/17 1400)  . famotidine (PEPCID) IV Stopped (08/24/17 1136)  . feeding supplement (VITAL HIGH PROTEIN) 1,000 mL (08/24/17 0234)  . fentaNYL infusion INTRAVENOUS 400 mcg/hr (08/24/17 1359)  . furosemide (LASIX) infusion 12 mg/hr (08/24/17 1400)  . lactated ringers Stopped (08/18/17 0933)  . lactated ringers 10 mL/hr at 08/24/17 1400  . lactated ringers Stopped (08/19/17 1602)  . meropenem (MERREM) IV    . milrinone 0.5 mcg/kg/min (08/24/17 1400)  . norepinephrine (LEVOPHED) Adult infusion 4 mcg/min (08/24/17 1400)  . vancomycin     Followed by  . 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 Sternum initially left open and closed 08/19/17. Hemodynamics have been fairly stable on current support.  Continue present support.  VT/VF: on IV amio. Episode of VF yesterday but none since.  Marked volume overload with anasarca. CXR shows pulmonary edema that is improving on CXR by my assessment.  He is not ready for vent wean yet. He is diuresing well but need more out and less in.  Lasix increased to 15 mg.  Will need to hold off on vent wean until volume is off and CXR improved.  Fever to 100.9 and 100+ all day yesterday and today. WBC up slightly. He is at high risk for sepsis given his course and CXR appearance with pulmonary edema could easily evolve into pneumonia. Agree with broadening antibiotic coverage to meropenem.  Coumadin and Heparin per pharmacy. Therapeutic.   Tube feeds started and tolerating well.   Chest tube output low. Will remove.    I reviewed the LVAD parameters from today, and compared the results to the patient's prior recorded data.  No programming changes were made.  The LVAD is functioning within specified parameters.   LVAD interrogation was negative for any significant power changes, alarms or PI events/speed drops.  LVAD equipment check completed and is in good working order.  Back-up equipment present.   LVAD education done on emergency procedures and precautions and reviewed exit site care.  Length of Stay: [address]  Fernande Boyden [facility] 08/24/2017, 3:02 PM

## 2017-08-25 ENCOUNTER — Encounter (HOSPITAL_COMMUNITY): Payer: PPO | Admitting: Internal Medicine

## 2017-08-25 ENCOUNTER — Inpatient Hospital Stay: Payer: Self-pay

## 2017-08-25 ENCOUNTER — Inpatient Hospital Stay (HOSPITAL_COMMUNITY): Payer: PPO

## 2017-08-25 DIAGNOSIS — E873 Alkalosis: Secondary | ICD-10-CM

## 2017-08-25 DIAGNOSIS — J810 Acute pulmonary edema: Secondary | ICD-10-CM

## 2017-08-25 DIAGNOSIS — J9601 Acute respiratory failure with hypoxia: Secondary | ICD-10-CM

## 2017-08-25 LAB — COOXEMETRY PANEL
Carboxyhemoglobin: 2.2 % — ABNORMAL HIGH (ref 0.5–1.5)
Carboxyhemoglobin: 1.6 % — ABNORMAL HIGH (ref 0.5–1.5)
Methemoglobin: 1.1 % (ref 0.0–1.5)
Methemoglobin: 0.9 % (ref 0.0–1.5)
O2 Saturation: 67.4 %
O2 Saturation: 46.5 %
Total hemoglobin: 8.3 g/dL — ABNORMAL LOW (ref 12.0–16.0)
Total hemoglobin: 12.9 g/dL (ref 12.0–16.0)

## 2017-08-25 LAB — BASIC METABOLIC PANEL
ANION GAP: 11 (ref 5–15)
Anion gap: 10 (ref 5–15)
BUN: 45 mg/dL — AB (ref 8–23)
BUN: 48 mg/dL — AB (ref 8–23)
CALCIUM: 8.7 mg/dL — AB (ref 8.9–10.3)
CHLORIDE: 93 mmol/L — AB (ref 98–111)
CO2: 31 mmol/L (ref 22–32)
CO2: 31 mmol/L (ref 22–32)
CREATININE: 1.19 mg/dL (ref 0.61–1.24)
Calcium: 8.2 mg/dL — ABNORMAL LOW (ref 8.9–10.3)
Chloride: 97 mmol/L — ABNORMAL LOW (ref 98–111)
Creatinine, Ser: 1.23 mg/dL (ref 0.61–1.24)
GFR calc Af Amer: >60 mL/min (ref >60)
GFR calc Af Amer: >60 mL/min (ref >60)
GFR, EST NON AFRICAN AMERICAN: 59 mL/min — AB (ref >60)
GLUCOSE: 168 mg/dL — AB (ref 70–99)
Glucose, Bld: 197 mg/dL — ABNORMAL HIGH (ref 70–99)
POTASSIUM: 3.6 mmol/L (ref 3.5–5.1)
POTASSIUM: 4.9 mmol/L (ref 3.5–5.1)
SODIUM: 138 mmol/L (ref 135–145)
Sodium: 135 mmol/L (ref 135–145)

## 2017-08-25 LAB — BPAM RBC
Blood Product Expiration Date: 201907142359
Blood Product Expiration Date: 201907202359
ISSUE DATE / TIME: 201906221046
ISSUE DATE / TIME: 201906251517
Unit Type and Rh: 5100
Unit Type and Rh: 5100

## 2017-08-25 LAB — BLOOD GAS, ARTERIAL
Acid-Base Excess: 7.6 mmol/L — ABNORMAL HIGH (ref 0.0–2.0)
Bicarbonate: 31.4 mmol/L — ABNORMAL HIGH (ref 20.0–28.0)
FIO2: 40
MECHVT: 600 mL
Nitric Oxide: 10
O2 Saturation: 94.2 %
PEEP: 5 cmH2O
Patient temperature: 100.4
RATE: 16 resp/min
pCO2 arterial: 44.8 mmHg (ref 32.0–48.0)
pH, Arterial: 7.465 — ABNORMAL HIGH (ref 7.350–7.450)
pO2, Arterial: 75.7 mmHg — ABNORMAL LOW (ref 83.0–108.0)

## 2017-08-25 LAB — TYPE AND SCREEN
ABO/RH(D): O POS
ANTIBODY SCREEN: NEGATIVE
Unit division: 0
Unit division: 0

## 2017-08-25 LAB — COMPREHENSIVE METABOLIC PANEL
ALT: 150 U/L — ABNORMAL HIGH (ref 0–44)
AST: 342 U/L — ABNORMAL HIGH (ref 15–41)
Albumin: 2.3 g/dL — ABNORMAL LOW (ref 3.5–5.0)
Alkaline Phosphatase: 77 U/L (ref 38–126)
Anion gap: 10 (ref 5–15)
BUN: 42 mg/dL — ABNORMAL HIGH (ref 8–23)
CO2: 31 mmol/L (ref 22–32)
Calcium: 8.4 mg/dL — ABNORMAL LOW (ref 8.9–10.3)
Chloride: 95 mmol/L — ABNORMAL LOW (ref 98–111)
Creatinine, Ser: 1.18 mg/dL (ref 0.61–1.24)
GFR calc Af Amer: >60 mL/min (ref >60)
GFR calc non Af Amer: >60 mL/min (ref >60)
Glucose, Bld: 199 mg/dL — ABNORMAL HIGH (ref 70–99)
Potassium: 2.9 mmol/L — ABNORMAL LOW (ref 3.5–5.1)
Sodium: 136 mmol/L (ref 135–145)
Total Bilirubin: 2 mg/dL — ABNORMAL HIGH (ref 0.3–1.2)
Total Protein: 6.8 g/dL (ref 6.5–8.1)

## 2017-08-25 LAB — GLUCOSE, CAPILLARY
GLUCOSE-CAPILLARY: 142 mg/dL — AB (ref 70–99)
GLUCOSE-CAPILLARY: 191 mg/dL — AB (ref 70–99)
GLUCOSE-CAPILLARY: 197 mg/dL — AB (ref 70–99)
GLUCOSE-CAPILLARY: 198 mg/dL — AB (ref 70–99)
GLUCOSE-CAPILLARY: 215 mg/dL — AB (ref 70–99)
Glucose-Capillary: 190 mg/dL — ABNORMAL HIGH (ref 70–99)
Glucose-Capillary: 185 mg/dL — ABNORMAL HIGH (ref 70–99)

## 2017-08-25 LAB — CBC
HCT: 25.9 % — ABNORMAL LOW (ref 39.0–52.0)
Hemoglobin: 8.4 g/dL — ABNORMAL LOW (ref 13.0–17.0)
MCH: 27.1 pg (ref 26.0–34.0)
MCHC: 32.4 g/dL (ref 30.0–36.0)
MCV: 83.5 fL (ref 78.0–100.0)
Platelets: 338 10*3/uL (ref 150–400)
RBC: 3.1 MIL/uL — ABNORMAL LOW (ref 4.22–5.81)
RDW: 16.1 % — ABNORMAL HIGH (ref 11.5–15.5)
WBC: 13 10*3/uL — ABNORMAL HIGH (ref 4.0–10.5)

## 2017-08-25 LAB — PROCALCITONIN: Procalcitonin: 0.53 ng/mL

## 2017-08-25 LAB — PROTIME-INR
INR: 2.54
Prothrombin Time: 27.1 seconds — ABNORMAL HIGH (ref 11.4–15.2)

## 2017-08-25 LAB — LACTATE DEHYDROGENASE: LDH: 425 U/L — ABNORMAL HIGH (ref 98–192)

## 2017-08-25 LAB — MAGNESIUM: Magnesium: 2 mg/dL (ref 1.7–2.4)

## 2017-08-25 MED ORDER — SPIRONOLACTONE 25 MG PO TABS
50.0000 mg | ORAL_TABLET | Freq: Two times a day (BID) | ORAL | Status: DC
  Administered 2017-08-25 – 2017-08-28 (×7): 50 mg via ORAL
  Filled 2017-08-25 (×7): qty 2

## 2017-08-25 MED ORDER — LIDOCAINE HCL (CARDIAC) PF 100 MG/5ML IV SOSY
PREFILLED_SYRINGE | INTRAVENOUS | Status: AC
  Administered 2017-08-25: 200 mg
  Filled 2017-08-25: qty 10

## 2017-08-25 MED ORDER — WARFARIN SODIUM 1 MG PO TABS
1.0000 mg | ORAL_TABLET | Freq: Once | ORAL | Status: AC
  Administered 2017-08-25: 1 mg via ORAL
  Filled 2017-08-25: qty 1

## 2017-08-25 MED ORDER — SPIRONOLACTONE 25 MG PO TABS: 25.0000 mg | ORAL_TABLET | Freq: Two times a day (BID) | ORAL | Status: DC

## 2017-08-25 MED ORDER — POTASSIUM CHLORIDE 10 MEQ/50ML IV SOLN: 10.0000 meq | INTRAVENOUS | Status: AC

## 2017-08-25 MED ORDER — POTASSIUM CHLORIDE 20 MEQ/15ML (10%) PO SOLN
30.0000 meq | Freq: Once | ORAL | Status: AC
  Administered 2017-08-25: 30 meq via ORAL
  Filled 2017-08-25: qty 30

## 2017-08-25 MED ORDER — POTASSIUM CHLORIDE 10 MEQ/50ML IV SOLN
10.0000 meq | INTRAVENOUS | Status: AC
  Administered 2017-08-25 (×2): 10 meq via INTRAVENOUS

## 2017-08-25 MED ORDER — SPIRONOLACTONE 25 MG PO TABS: 25.0000 mg | ORAL_TABLET | Freq: Every day | ORAL | Status: DC

## 2017-08-25 MED ORDER — POTASSIUM CHLORIDE 10 MEQ/50ML IV SOLN
10.0000 meq | INTRAVENOUS | Status: AC
  Administered 2017-08-25 (×3): 10 meq via INTRAVENOUS
  Filled 2017-08-25: qty 50

## 2017-08-25 MED ORDER — MAGNESIUM OXIDE 400 (241.3 MG) MG PO TABS
400.0000 mg | ORAL_TABLET | Freq: Every day | ORAL | Status: DC
  Administered 2017-08-25: 400 mg via ORAL
  Filled 2017-08-25: qty 1

## 2017-08-25 MED ORDER — MAGNESIUM OXIDE 400 (241.3 MG) MG PO TABS
400.0000 mg | ORAL_TABLET | Freq: Two times a day (BID) | ORAL | Status: DC
  Administered 2017-08-25 – 2017-08-30 (×10): 400 mg via ORAL
  Filled 2017-08-25 (×10): qty 1

## 2017-08-25 MED ORDER — ACETAMINOPHEN 160 MG/5ML PO SOLN: 650.0000 mg | ORAL | Status: DC | PRN

## 2017-08-25 MED ORDER — POTASSIUM CHLORIDE 20 MEQ/15ML (10%) PO SOLN: 60.0000 meq | Freq: Three times a day (TID) | ORAL | Status: DC

## 2017-08-25 MED ORDER — POTASSIUM CHLORIDE 20 MEQ/15ML (10%) PO SOLN
60.0000 meq | Freq: Three times a day (TID) | ORAL | Status: DC
  Administered 2017-08-25 – 2017-08-27 (×6): 60 meq via ORAL
  Filled 2017-08-25 (×7): qty 45

## 2017-08-25 MED ORDER — POTASSIUM CHLORIDE 20 MEQ/15ML (10%) PO SOLN
20.0000 meq | Freq: Once | ORAL | Status: AC
  Administered 2017-08-25: 20 meq via ORAL
  Filled 2017-08-25: qty 15

## 2017-08-25 MED ORDER — ACETAZOLAMIDE SODIUM 500 MG IJ SOLR
250.0000 mg | Freq: Four times a day (QID) | INTRAMUSCULAR | Status: AC
  Administered 2017-08-25 – 2017-08-26 (×3): 250 mg via INTRAVENOUS
  Filled 2017-08-25 (×3): qty 250

## 2017-08-25 NOTE — Consult Note (Addendum)
PULMONARY / CRITICAL CARE MEDICINE   Name: [name] [name] MRN: [id number] DOB: 1950/03/29    ADMISSION DATE:  08/11/2017 CONSULTATION DATE:  08/25/2017  REFERRING MD:  Dr. [name]  CHIEF COMPLAINT:  Difficultly weaning from vent, vent management  HISTORY OF PRESENT ILLNESS:   [name] [name] is a 67 y.o. Male with a PMH of Ischemic cardiomyopathy, chronic systolic HF, CAD, s/p AICD, HTN, CKD, and DM II who was admitted to [facility] 08/11/17 for initiation of Milrinone due to persistent low cardiac output.  Upon admission, he was noted to have marked volume overload, therefore IV diuresis initiated.  On 08/17/17 he underwent implantation of HeartMate 3 LVAD.  His hospital course has been complicated by cardiogenic shock in setting of Acute on Chronic Systolic CHF with biventricluar failure, Ventilator dependent respiratory failure, and episodes of V-tach/V-fib requiring cardioversion.   PCCM is consulted 08/25/17 to assist with ventilator management and ventilator weaning.  PAST MEDICAL HISTORY :  He  has a past medical history of AICD (automatic cardioverter/defibrillator) present (02/05/2014), Atrial flutter ([place]) (04/2012), CAD (coronary artery disease) (8110,3159 X 2 ), CHF (congestive heart failure) ([place]), Chronic anticoagulation, Chronic systolic heart failure ([place]), CKD (chronic kidney disease), Diabetic retinopathy ([place]), DM type 2 (diabetes mellitus, type 2) ([place]), HTN (hypertension), Hypercholesteremia, ICD (implantable cardiac defibrillator) in place, Ischemic cardiomyopathy (March 2015), Nephrolithiasis, and Ventricular tachycardia ([place]).  PAST SURGICAL HISTORY: He  has a past surgical history that includes Percutaneous coronary stent intervention (pci-s) (January 2002); Cardiac defibrillator placement (2007); Percutaneous coronary stent intervention (pci-s) (June 2002); Percutaneous coronary stent intervention (pci-s) (July 2006); Biv icd genertaor change out (02/05/2014); Atrial flutter  ablation (N/A, 05/19/2012); bi-ventricular implantable cardioverter defibrillator upgrade (N/A, 02/05/2014); RIGHT/LEFT HEART CATH AND CORONARY ANGIOGRAPHY (N/A, 02/18/2017); RIGHT HEART CATH (N/A, 07/22/2017); IR US Guide Vasc Access Right (08/12/2017); IR Fluoro Guide CV Line Right (08/12/2017); RIGHT HEART CATH (N/A, 08/13/2017); Insertion of implantable left ventricular assist device (N/A, 08/16/2017); TEE without cardioversion (N/A, 08/16/2017); Tricuspid valve replacement (N/A, 08/16/2017); Mediastinal exploration (08/17/2017); Sternal closure (N/A, 08/19/2017); and TEE without cardioversion (N/A, 08/19/2017).  Allergies  Allergen Reactions  . Penicillins Hives, Itching and Other (See Comments)    Has patient had a PCN reaction causing immediate rash, facial/tongue/throat swelling, SOB or lightheadedness with hypotension:# # Yes # # Has patient had a PCN reaction causing severe rash involving mucus membranes or skin necrosis: No Has patient had a PCN reaction that required hospitalization: No Has patient had a PCN reaction occurring within the last 10 years: No If all of the above answers are "NO", then may proceed with Cephalosporin use.     No current facility-administered medications on file prior to encounter.    Current Outpatient Medications on File Prior to Encounter  Medication Sig  . acetaminophen (TYLENOL) 500 MG tablet Take 1,000 mg by mouth every 6 (six) hours as needed for moderate pain or headache.  Marland Kitchen apixaban (ELIQUIS) 5 MG TABS tablet Take 1 tablet (5 mg total) by mouth 2 (two) times daily.  Marland Kitchen atorvastatin (LIPITOR) 80 MG tablet TAKE ONE TABLET BY MOUTH ONCE DAILY (Patient taking differently: TAKE 80 MG BY MOUTH ONCE DAILY)  . digoxin (LANOXIN) 0.125 MG tablet Take 1 tablet (0.125 mg total) by mouth daily. (Patient taking differently: Take 0.125 mg by mouth at bedtime. )  . Dulaglutide (TRULICITY) 1.5 YV/8.5FY SOPN Inject contents of pen weekly (Patient taking differently: Inject 1.5  mg into the skin every Friday. )  . gabapentin (NEURONTIN) 300 MG capsule  Take 1 capsule (300 mg total) by mouth 3 (three) times daily.  . insulin NPH-regular Human (NOVOLIN 70/30 RELION) (70-30) 100 UNIT/ML injection Inject 30 Units into the skin 2 (two) times daily with a meal.  . losartan (COZAAR) 25 MG tablet Take 12.5 mg by mouth at bedtime.  . magnesium oxide (MAG-OX) 400 MG tablet Take 1 tablet (400 mg total) by mouth daily.  . Multiple Vitamins-Minerals (MULTIVITAMIN PO) Take 1 capsule by mouth daily.   . sotalol (BETAPACE) 120 MG tablet TAKE ONE TABLET BY MOUTH TWICE DAILY (Patient taking differently: TAKE 120MG BY MOUTH TWICE DAILY)  . torsemide (DEMADEX) 100 MG tablet Take 50 mg by mouth See admin instructions. Take 50 mg by mouth daily except DO NOT TAKE on Sunday    FAMILY HISTORY:  His indicated that his mother is alive. He indicated that his father is deceased. He indicated that four of his five brothers are alive. He indicated that the status of his daughter is unknown. He indicated that the status of his son is unknown. He indicated that the status of his neg hx is unknown.   SOCIAL HISTORY: He  reports that he quit smoking about 17 years ago. His smoking use included cigarettes. He has a 29.00 pack-year smoking history. He has never used smokeless tobacco. He reports that he does not drink alcohol or use drugs.  REVIEW OF SYSTEMS:   Unable to obtain as pt in intubated and sedated  SUBJECTIVE:  Low grade fever On SIMV: 40%,  On Amiodarone, Epinephrine 65mg, Levophed 4 mcg  Awake, follows commands  VITAL SIGNS: BP 109/74   Pulse 71   Temp (!) 100.4 F (38 C)   Resp (!) 51   Ht 5' 10.98" (1.803 m)   Wt 260 lb 2.3 oz (118 kg)   SpO2 99%   BMI 36.30 kg/m   HEMODYNAMICS: PAP: (44-70)/(17-34) 45/17 CVP:  [11 mmHg-26 mmHg] 15 mmHg  VENTILATOR SETTINGS: Vent Mode: SIMV;PSV;PRVC FiO2 (%):  [40 %] 40 % Set Rate:  [16 bmp] 16 bmp Vt Set:  [600 mL] 600 mL PEEP:  [5  cmH20] 5 cmH20 Pressure Support:  [10 cmH20] 10 cmH20 Plateau Pressure:  [17 cmH20-23 cmH20] 17 cmH20  INTAKE / OUTPUT: I/O last 3 completed shifts: In: 7366.8 [I.V.:4160.3; Blood:320; NG/GT:1797.7; IV Piggyback:1088.8] Out: 9146 [Collina.Benders Emesis/NG output:325; Chest Tube:156]  PHYSICAL EXAMINATION: General:  Acutely ill appearing male, laying in bed, intubated, on SIMV, in no acute distress Neuro:  Awake, alert, nods to questions, follows simple commands, No focal deficits HEENT:  Atraumatic, normocephalic, neck supple, JVD, MM pink/moist Cardiovascular:  Irregularly irregular rhythm, mechanical heart sounds, LVAD hum audible Lungs:  Coarse breath sounds, diminished, even, non-labored Abdomen:  Obese, soft, nontender, BS+ x4 Musculoskeletal:  Normal bulk and tone, 2+ bilateral LE edema Skin:  Dry, warm. No cyanosis.  LABS:  BMET Recent Labs  Lab 08/24/17 0352 08/24/17 1745 08/25/17 0500  NA 135 136 136  K 3.2* 3.3* 2.9*  CL 98 97* 95*  CO2 '27 28 31  ' BUN 33* 40* 42*  CREATININE 1.18 1.23 1.18  GLUCOSE 175* 180* 199*    Electrolytes Recent Labs  Lab 08/21/17 0335  08/22/17 0406 08/23/17 0412  08/24/17 0352 08/24/17 1745 08/25/17 0500  CALCIUM 8.2*   < > 8.0* 8.4*   < > 8.4* 8.2* 8.4*  MG  --    < > 2.0 1.8  --  2.1  --  2.0  PHOS 2.7  --  2.4*  2.4*  --   --   --   --    < > = values in this interval not displayed.    CBC Recent Labs  Lab 08/23/17 0412 08/24/17 0352 08/25/17 0500  WBC 13.7* 14.3* 13.0*  HGB 8.5* 8.1* 8.4*  HCT 26.5* 25.2* 25.9*  PLT 261 329 338    Coag's Recent Labs  Lab 08/18/17 1717 08/19/17 0443  08/23/17 0412 08/24/17 0352 08/25/17 0500  APTT 78* 95*  --   --   --   --   INR  --  1.57   < > 2.64 2.97 2.54   < > = values in this interval not displayed.    Sepsis Markers No results for input(s): LATICACIDVEN, PROCALCITON, O2SATVEN in the last 168 hours.  ABG Recent Labs  Lab 08/23/17 0418 08/24/17 0411  08/25/17 0620  PHART 7.433 7.468* 7.465*  PCO2ART 36.0 39.5 44.8  PO2ART 80.0* 68.4* 75.7*    Liver Enzymes Recent Labs  Lab 08/23/17 0412 08/24/17 0352 08/25/17 0500  AST 110* 223* 342*  ALT 44 93* 150*  ALKPHOS 67 71 77  BILITOT 1.4* 1.4* 2.0*  ALBUMIN 2.3* 2.2* 2.3*    Cardiac Enzymes No results for input(s): TROPONINI, PROBNP in the last 168 hours.  Glucose Recent Labs  Lab 08/24/17 1127 08/24/17 1624 08/24/17 2009 08/25/17 0019 08/25/17 0434 08/25/17 0727  GLUCAP 219* 174* 205* 215* 198* 191*    Imaging Dg Chest Port 1 View  Result Date: 08/25/2017 CLINICAL DATA:  Check LVAD EXAM: PORTABLE CHEST 1 VIEW COMPARISON:  08/24/2017 FINDINGS: Cardiac shadow is again enlarged. Left ventricular assist device is again noted. Defibrillator is again seen and stable. Endotracheal tube, nasogastric catheter and feeding catheter are again noted and stable. Swan-Ganz catheter and right jugular central line are again seen and stable. Lungs are hypoinflated with accentuation of the vascular markings relatively stable from the prior exam. Some increasing density in the right upper lobe is noted consistent with wall the infiltrate. Continued follow-up is recommended IMPRESSION: Increasing right upper lobe infiltrate. Poor inspiratory effort with underlying vascular congestion. Tubes and lines as described above stable from the previous day. Electronically Signed   By: Inez Catalina M.D.   On: 08/25/2017 09:15   [place] Ekg Site Rite  Result Date: 08/25/2017 If Site Rite image not attached, placement could not be confirmed due to current cardiac rhythm.    STUDIES: ECHO 2/70>> Systolic function was severely reduced. The estimated ejection fraction was in the range of 20% to 25%. Akinesis of the apicalanteroseptal, inferoseptal, and apical myocardium. Dyskinesis of the inferior myocardium. The right atrium was mildly dilated. PA peak pressure:48 mm Hg  Cardiac cath 08/13/17>> Mildly elevated  filling pressures and normalized cardiac output on milrinone 0.375  CULTURES: Surgical/wound 6/17>> Sputum 6/22>> negative Blood culture 6/25>> Sputum 6/25>>  ANTIBIOTICS: Levofloxacin 6/18>>6/25 Meropenem 6/25>> Vancomycin 6/25>>  SIGNIFICANT EVENTS: 6/17>>Underwent HM-3 implant>>unable to close chest with RV failure and high intrathoracic pressure 6/18>> Take back to OR for evacuation of hematoma. No RVAD needed. Sternum left open 6/20>> Taken back to OR for sternal closure 6/22>> VT, amiodarone infusion started 6/24>> VT/VF, underwent urgent cardioversion 6/25>> VT, underwent urgent cardioversion 6/26>> PCCM consulted for vent management/weaning  LINES/TUBES: R IJ CVC 6/17>> ETT 6/17>>  DISCUSSION: 67 y.o,.with Cardiogenic shock in setting of Acute on Chronic Systolic CHF with biventricluar failure, s/p LVAD placement, volume overload and Ventilator dependent respiratory failure, and episodes of V-tach/V-fib requiring cardioversion.     ASSESSMENT /  PLAN:  PULMONARY A: Acute hypoxic respiratory failure in setting of Acute on Chronic Systolic CHF with Biventricular failure ? RUL PNA (VAP) P:   Change vent to PRVC SBT when parameters met VAP Bundle Pulmonary hygiene Follow Cultures as above Abx as above Trend PCT F/u CXR in am 6/27 F/u ABG  CARDIOVASCULAR A:  Acute on Chronic systolic CHF with Biventricular Failure Cardiogenic shock Intermittent episodes of V-tach/Vfib Hx: CAD s/p PCI of RCA & LAD -Echo 08/13/17>> EF 20-25% -s/p [employer] BiV ICD in place -s/p LVAD (HM-3) implant 6/17 PAF P:  Heart Failure team & Cardiothoracic Surgery following, appreciate input Continue Milrinone, Epinephrine, Levophed per Cardiology Maintain MAP >65 Continue Amiodarone gtt IV diuresis with Lasix gtt per Cardiology  RENAL A:   CKD II-III Metabolic alkalosis, likely in setting of aggressive diuresis Hypokalemia P:   Monitor I&O's / urine output Trend BMP Ensure  adequate renal perfusion Avoid nephrotoxic agents as able Will give Diamox IV  GASTROINTESTINAL A:   Hypoalbuminemia P:   NPO Tube feeds, rate adjustment per dietician  HEMATOLOGIC A:   Anemia P:  Monitor for s/sx of bleeding Trend CBC S/p 1 unit PRBCs 6/25 Transfuse for Hgb<7  INFECTIOUS A:   Leukocytosis Fever ? RUL PNA (VAP) P:   Monitor fever curve Trend WBCs Check PCT Follow cultures Abx as above Follow CXR  ENDOCRINE A:   Hyperglycemia Hx: DM II P:   CBG's SSI Continue Levemir  NEUROLOGIC A:   Sedation needs for mechanical ventilation P:   RASS goal: 0 to -1 Daily WUA Precedex and Fentanyl gtts to maintain RASS goal Avoid sedating meds as able Provide supportive care Lights on during the day   FAMILY  - Updates: Updated pt's wife at bedside 6/26.  - Inter-disciplinary family meet or Palliative Care meeting due by:  09/01/2017    Darel Hong, AGACNP-BC [place] Pager: [phone]  08/25/2017, 10:54 AM  Attending Note:  67 year old male with extensive cardiac history who presents to [facility] for vent management.  Patient had a recent LVAD placement.  Has been on the ventilator and on pressors.  Patient is responding to lasix drip but issues is that when he becomes hypokalemic he goes into VT.  The patient is also profoundly alkalotic due to contraction alkalosis due to lasix.  On exam, he is unresponsive, with diffuse crackles.  I reviewed CXR myself, pulmonary edema noted, ?RUL infiltrate early, ETT is in good position.  Will begin PS trials now.  F/U ABG and CXR in AM.  PCCM will continue to follow.  The patient is critically ill with multiple organ systems failure and requires high complexity decision making for assessment and support, frequent evaluation and titration of therapies, application of advanced monitoring technologies and extensive interpretation of multiple databases.    Critical Care Time devoted to patient care services described in this note is  35  Minutes. This time reflects time of care of this signee Dr Jennet Maduro. This critical care time does not reflect procedure time, or teaching time or supervisory time of PA/NP/Med student/Med Resident etc but could involve care discussion time.  Rush Farmer, M.D. [facility] Pulmonary/Critical Care Medicine. Pager: [phone]. After hours pager: [phone].

## 2017-08-25 NOTE — Progress Notes (Signed)
LVAD Coordinator Rounding Not  Admitted 08/11/17 for initiation of Milrinone. 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 He was evaluated [facility] /Dr Posey Pronto on 5/9 for possible heart transplant.   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 Prescott Gum under Destination Therapy criteria. Dr [name] discussed heart transplant candidacy with Dr Posey Pronto. Given size and blood type there was concern he would not make it to transplant.   Pt remains intubated and sedated with mittens on. Seems to be less agitated today.   Episode of VT this am with rated > 200; defibrillated with 120J. K+ was 2.9 at this time.   Cortrak nurse at bedside attempting to replace Cortrak - unable to advance tube post pyloric.  Vital signs: Temp:  100.6 HR: 85 afib Arterial line:  103/66 (76) Doppler: 86 O2 Sat:  100% on 40% FiO2 Wt: 238>246>242>274>267>259>269>261>260lbs   LVAD interrogation reveals:  Speed: 5200 Flow: 3.8 Power:  3.6w PI:  3.9 Alarms: none  Events: 1 PI event Hematocrit:  26 Fixed speed: 5200 Low speed limit: 4900  Drive Line: Driveline dressing changed by the wife with nurse observing.  Drive line anchor secure.  Labs:  LDH trend: 274>287>245>240>324>384>425  INR trend:  1.71>1.92>1.57>1.37>2.64>2.97>2.54  Anticoagulation Plan: -INR Goal: 2.0 - 2.5 -ASA Dose: 81 mg daily   Blood Products:  Intra op: - 08/16/17> one platelet; 2 FFP - 08/17/17> ome FFP  Post op: - 08/16/17 > one unit PCs - 08/21/17 > one unit PCs - 08/24/17 > one unit PCs  Device:  - BiV Medtronic - Therapies: off  Respiratory: vented  Nitric Oxide:  4.5 ppm  Gtts: - Epi 2 mcg/min - Milrinone 0.5 mcg/kg/min - Lasix 15 mg/hr - Precedex 1.2 mcg/kg/hr - Amiodarone 30 mg/hr - Fentanyl 400 mcg/hr - Levo 4 mcg/min  Adverse Events on VAD: - 08/17/17>mediastinal reexploration with evacuation of mediastinal clot - 08/19/17>sternal closure  VAD education:  Pt remains intubated, unable  to initiate education. Wife to perform dressing change today with nurse observing.  Plan/Recommendations:  1. Daily dressing changes per VAD coordinator, nurse champion, or trained caregiver.  2. Page VAD Coordinator with any VAD equipment or drive line issues.   Zada Girt RN, VAD Coordinator 24/7 VAD Pager: [phone]

## 2017-08-25 NOTE — Progress Notes (Signed)
HeartMate 3  postop day #9 rounding Note  Subjective:   HeartMate 3 implantation with combined tricuspid valve annuloplasty repair June 17 for mixed ischemic and nonischemic cardiomyopathy, cardiac amyloid disease and preoperative moderate to severe RV dysfunction, preoperative moderate to severe tricuspid regurgitation  Patient had significant RV dysfunction and sternum was left open to maintain adequate hemodynamics.  During the night after surgeryThe patient developed  further signs of right ventricular dysfunction and was taken emergently back for mediastinal reexploration.  A clot was found compressing the right atrium from oozing from the pericardium where adhesions had been taken down at the time of surgery.  The patient returned to the operating room on June 20 for delayed sternal closure.  Patient has been started on IV heparin since postop day 1.  He remains intubated on inhaled nitric oxide to optimize RV function.  We are weaning nitric oxide and following parameters of RV function.  So far he has tolerated wean to 5 ppm today.remains on low-dose epinephrine and high-dose milrinone for RVsupport. Chronic atrial fibrillation. INR therapeutic.   Episode of VT his a.m. Required cardioversion under sedation  LVAD INTERROGATION:  HeartMate II LVAD:  Flow 4 liters/min, speed 5200, power 3.8, PI 2.8.  Controller intact.   Objective:    Vital Signs:   Temp:  [99.3 F (37.4 C)-100.6 F (38.1 C)] 100.4 F (38 C) (06/26 1500) Pulse Rate:  [41-104] 92 (06/26 1515) Resp:  [11-51] 22 (06/26 1515) BP: (86-127)/(63-92) 86/63 (06/26 2000) SpO2:  [80 %-100 %] 98 % (06/26 1935) Arterial Line BP: (86-121)/(57-103) 94/75 (06/26 1500) FiO2 (%):  [40 %] 40 % (06/26 1935) Weight:  [260 lb 2.3 oz (118 kg)] 260 lb 2.3 oz (118 kg) (06/26 0400) Last BM Date: (smear) Mean arterial Pressure 75-80 mmHg  Intake/Output:   Intake/Output Summary (Last 24 hours) at 08/25/2017 2024 Last data filed at  08/25/2017 2000 Gross per 24 hour  Intake 7176.79 ml  Output 7625 ml  Net -448.21 ml     Physical Exam: General:  Well appearing.  Intubated and sedated HEENT: normal Neck: supple. JVP . Carotids 2+ bilat; no bruits. No lymphadenopathy or thryomegaly appreciated. Cor: Mechanical heart sounds with LVAD hum present. Lungs: clear Abdomen: soft, nontender, nondistended. No hepatosplenomegaly. No bruits or masses. Good bowel sounds. Extremities: no cyanosis, clubbing, rash, edema Neuro: alert & orientedx3, cranial nerves grossly intact. moves all 4 extremities w/o difficulty. Affect pleasant  Telemetry: V paced at 90/min for underlying A. fib  Labs: Basic Metabolic Panel: Recent Labs  Lab 08/19/17 0443  08/20/17 0429  08/21/17 0335 08/21/17 1116  08/22/17 0406 08/23/17 6256  08/23/17 1808 08/24/17 0352 08/24/17 1745 08/25/17 0500 08/25/17 1310  NA 135   < > 134*   < > 132*  --    < > 133* 135   < > 134* 135 136 136 138  K 3.3*   < > 3.9   < > 3.7  --    < > 4.0 3.3*   < > 3.6 3.2* 3.3* 2.9* 3.6  CL 104   < > 102   < > 100*  --    < > 102 98*   < > 96* 98 97* 95* 97*  CO2 24  --  24   < > 23  --    < > 22 25   < > '25 27 28 31 31  ' GLUCOSE 160*   < > 197*   < > 238*  --    < >  232* 218*   < > 175* 175* 180* 199* 168*  BUN 7   < > 5*   < > 11  --    < > 18 23*   < > 30* 33* 40* 42* 45*  CREATININE 0.89   < > 0.86   < > 0.93  --    < > 0.98 1.05   < > 1.10 1.18 1.23 1.18 1.19  CALCIUM 8.4*  --  8.3*   < > 8.2*  --    < > 8.0* 8.4*   < > 8.4* 8.4* 8.2* 8.4* 8.7*  MG 1.5*  --  1.6*  --   --  1.9  --  2.0 1.8  --   --  2.1  --  2.0  --   PHOS 2.9  --  3.0  --  2.7  --   --  2.4* 2.4*  --   --   --   --   --   --    < > = values in this interval not displayed.    Liver Function Tests: Recent Labs  Lab 08/21/17 0335 08/22/17 0406 08/23/17 0412 08/24/17 0352 08/25/17 0500  AST 45* 48* 110* 223* 342*  ALT 17 18 44 93* 150*  ALKPHOS 47 58 67 71 77  BILITOT 1.3* 1.2 1.4* 1.4*  2.0*  PROT 6.1* 6.0* 6.9 6.6 6.8  ALBUMIN 2.4* 2.3* 2.3* 2.2* 2.3*   No results for input(s): LIPASE, AMYLASE in the last 168 hours. No results for input(s): AMMONIA in the last 168 hours.  CBC: Recent Labs  Lab 08/19/17 0443  08/20/17 0429 08/21/17 0335 08/21/17 1957 08/22/17 0406 08/23/17 0412 08/24/17 0352 08/25/17 0500  WBC 10.0  --  9.1 7.8 8.5 8.8 13.7* 14.3* 13.0*  NEUTROABS 8.0*  --  7.7 6.0  --  6.5 11.0*  --   --   HGB 8.6*   < > 8.3* 7.9* 8.3* 8.4* 8.5* 8.1* 8.4*  HCT 27.0*   < > 26.4* 24.7* 26.4* 26.4* 26.5* 25.2* 25.9*  MCV 85.4  --  85.2 83.7 85.2 84.9 82.6 83.4 83.5  PLT 115*  --  134* 147* 178 194 261 329 338   < > = values in this interval not displayed.    INR: Recent Labs  Lab 08/21/17 0335 08/22/17 0406 08/23/17 0412 08/24/17 0352 08/25/17 0500  INR 1.42 1.62 2.64 2.97 2.54    Other results:  EKG:   Imaging: Dg Chest Port 1 View  Result Date: 08/25/2017 CLINICAL DATA:  Check LVAD EXAM: PORTABLE CHEST 1 VIEW COMPARISON:  08/24/2017 FINDINGS: Cardiac shadow is again enlarged. Left ventricular assist device is again noted. Defibrillator is again seen and stable. Endotracheal tube, nasogastric catheter and feeding catheter are again noted and stable. Swan-Ganz catheter and right jugular central line are again seen and stable. Lungs are hypoinflated with accentuation of the vascular markings relatively stable from the prior exam. Some increasing density in the right upper lobe is noted consistent with wall the infiltrate. Continued follow-up is recommended IMPRESSION: Increasing right upper lobe infiltrate. Poor inspiratory effort with underlying vascular congestion. Tubes and lines as described above stable from the previous day. Electronically Signed   By: Inez Catalina M.D.   On: 08/25/2017 09:15   Dg Chest Port 1 View  Result Date: 08/24/2017 CLINICAL DATA:  Follow-up LVAD EXAM: PORTABLE CHEST 1 VIEW COMPARISON:  08/23/2017 FINDINGS: Cardiac shadow  remains enlarged. Postsurgical changes are again seen. Left ventricular assist  device and defibrillator are again noted and stable. Swan-Ganz catheter, endotracheal tube and nasogastric catheter are also stable in appearance. Right jugular catheter and feeding catheter are again noted and stable. Left thoracostomy catheter remains in place without pneumothorax. Increased central markings consistent with vascular congestion are again identified and stable. No new focal abnormality is noted. IMPRESSION: The overall appearance of the chest is stable from the previous day. Electronically Signed   By: Inez Catalina M.D.   On: 08/24/2017 09:09   [place] Ekg Site Rite  Result Date: 08/25/2017 If Site Rite image not attached, placement could not be confirmed due to current cardiac rhythm.    Medications:     Scheduled Medications: . acetaZOLAMIDE  250 mg Intravenous Q6H  . aspirin  81 mg Oral Daily  . bisacodyl  10 mg Oral Daily   Or  . bisacodyl  10 mg Rectal Daily  . chlorhexidine gluconate (MEDLINE KIT)  15 mL Mouth Rinse BID  . Chlorhexidine Gluconate Cloth  6 each Topical Daily  . feeding supplement (PRO-STAT SUGAR FREE 64)  30 mL Per Tube BID  . insulin aspart  0-24 Units Subcutaneous Q4H  . insulin detemir  30 Units Subcutaneous BID  . levalbuterol  0.63 mg Nebulization TID  . magnesium oxide  400 mg Oral BID  . mouth rinse  15 mL Mouth Rinse 10 times per day  . metoCLOPramide (REGLAN) injection  10 mg Intravenous Q6H  . metolazone  5 mg Oral BID  . potassium chloride  60 mEq Oral TID  . sodium chloride flush  10-40 mL Intracatheter Q12H  . spironolactone  50 mg Oral BID  . Warfarin - Pharmacist Dosing Inpatient   Does not apply q1800    Infusions: . sodium chloride 10 mL/hr at 08/25/17 2000  . sodium chloride    . sodium chloride 10 mL/hr at 08/21/17 0200  . sodium chloride Stopped (08/25/17 1518)  . amiodarone 30 mg/hr (08/25/17 2000)  . dexmedetomidine (PRECEDEX) IV infusion 1.2  mcg/kg/hr (08/25/17 2000)  . EPINEPHrine 4 mg in dextrose 5% 250 mL infusion (16 mcg/mL) 2 mcg/min (08/25/17 2000)  . famotidine (PEPCID) IV Stopped (08/25/17 1050)  . feeding supplement (VITAL HIGH PROTEIN) 50 mL/hr at 08/25/17 0900  . fentaNYL infusion INTRAVENOUS 250 mcg/hr (08/25/17 2000)  . furosemide (LASIX) infusion 15 mg/hr (08/25/17 2000)  . lactated ringers Stopped (08/18/17 0933)  . lactated ringers 10 mL/hr at 08/25/17 2000  . lactated ringers Stopped (08/19/17 1602)  . meropenem (MERREM) IV Stopped (08/25/17 1639)  . milrinone 0.5 mcg/kg/min (08/25/17 2000)  . norepinephrine (LEVOPHED) Adult infusion Stopped (08/25/17 1519)  . 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 Preoperative moderate to severe RV dysfunction as well as moderate to severe tricuspid regurgitation Stable after delayed sternal closure  Plan/Discussion:   Continue RV support with inotropic agents while inhaled nitric oxide is being weaned and monitor CVP and LVAD flow carefully Aggressive diuresis and ventilator wean CCM input appreciated on ventilator support and wean  Patient tolerated pressure support trials today Full nutritional support through OG, core track tube nonfunctional and removed by dietary service   I reviewed the LVAD parameters from today, and compared the results to the patient's prior recorded data.  No programming changes were made.  The LVAD is functioning within specified parameters.  The patient performs LVAD self-test daily.  LVAD interrogation was negative for any significant power changes, alarms or PI events/speed drops.  LVAD equipment check completed and is in good working order.   Back-up equipment present.   LVAD education done on emergency procedures and precautions and reviewed exit site care.  Length of Stay: [place] III 08/25/2017, 8:24 PM

## 2017-08-25 NOTE — Progress Notes (Signed)
[place] for coumadin Indication: LVAD  Allergies  Allergen Reactions  . Penicillins Hives, Itching and Other (See Comments)    Has patient had a PCN reaction causing immediate rash, facial/tongue/throat swelling, SOB or lightheadedness with hypotension:# # Yes # # Has patient had a PCN reaction causing severe rash involving mucus membranes or skin necrosis: No Has patient had a PCN reaction that required hospitalization: No Has patient had a PCN reaction occurring within the last 10 years: No If all of the above answers are "NO", then may proceed with Cephalosporin use.     Patient Measurements: Height: 5' 10.98" (180.3 cm) Weight: 260 lb 2.3 oz (118 kg) IBW/kg (Calculated) : 75.26 Heparin Dosing Weight: 98.3 kg  Vital Signs: Temp: 100.2 F (37.9 C) (06/26 0600) BP: 127/92 (06/26 0000) Pulse Rate: 104 (06/26 0600)  Labs: Recent Labs    08/22/17 1644  08/23/17 0412  08/24/17 0352 08/24/17 1745 08/25/17 0500  HGB  --    < > 8.5*  --  8.1*  --  8.4*  HCT  --   --  26.5*  --  25.2*  --  25.9*  PLT  --   --  261  --  329  --  338  LABPROT  --   --  28.0*  --  30.7*  --  27.1*  INR  --   --  2.64  --  2.97  --  2.54  HEPARINUNFRC 0.35  --  0.27*  --   --   --   --   CREATININE  --   --  1.05   < > 1.18 1.23 1.18   < > = values in this interval not displayed.    Estimated Creatinine Clearance: 79.4 mL/min (by C-G formula based on SCr of 1.18 mg/dL).   Medical History: Past Medical History:  Diagnosis Date  . AICD (automatic cardioverter/defibrillator) present 02/05/2014   Upgrade to Medtronic biventricular ICD, serial number  [id number]   . Atrial flutter ([place]) 04/2012   s/p TEE-EPS+RFCA 04/2012  . CAD (coronary artery disease) [phone],[phone] X 2    RCA-T, 70% PL (off CFX), 99% Prox LAD/90% Dist LAD, S/P TAXUS stent x 2  . CHF (congestive heart failure) ([place])   . Chronic anticoagulation   . Chronic systolic heart failure ([place])    . CKD (chronic kidney disease)   . Diabetic retinopathy ([place])   . DM type 2 (diabetes mellitus, type 2) ([facility])    insulin dependent  . HTN (hypertension)   . Hypercholesteremia    ablation  . ICD (implantable cardiac defibrillator) in place   . Ischemic cardiomyopathy March 2015   20-25% 2D   . Nephrolithiasis   . Ventricular tachycardia ([facility])     Medications:  Scheduled:  . aspirin  81 mg Oral Daily  . bisacodyl  10 mg Oral Daily   Or  . bisacodyl  10 mg Rectal Daily  . chlorhexidine gluconate (MEDLINE KIT)  15 mL Mouth Rinse BID  . Chlorhexidine Gluconate Cloth  6 each Topical Daily  . feeding supplement (PRO-STAT SUGAR FREE 64)  30 mL Per Tube BID  . insulin aspart  0-24 Units Subcutaneous Q4H  . insulin detemir  30 Units Subcutaneous BID  . levalbuterol  0.63 mg Nebulization TID  . magnesium oxide  400 mg Oral Daily  . mouth rinse  15 mL Mouth Rinse 10 times per day  . metoCLOPramide (REGLAN) injection  10 mg Intravenous  Q6H  . metolazone  5 mg Oral BID  . potassium chloride  20 mEq Oral Once  . potassium chloride  60 mEq Oral TID  . sodium chloride flush  10-40 mL Intracatheter Q12H  . spironolactone  25 mg Oral Daily  . warfarin  1 mg Oral ONCE-1800  . Warfarin - Pharmacist Dosing Inpatient   Does not apply q1800    Assessment: 77 yom who underwent LVAD placement on 6/17. Heparin infusion has been running at 900 units/hr per physician. Underwent sternal closure yesterday on 6/20. Pharmacy resume heparin dosing post-procedure.  Heparin was stopped with INR 2.64 after 3 doses of warfarin. Today INR decreased to 2.54. Hgb up slightly to 8.4 from 8.1 but did receive 1 unit PRBC. Hgb 338. LDH trending upwards to 425. No s/sx of bleeding. On concurrent amiodarone IV, which can impact INR sensitivity. Of, note LFTs trending upwards.  Goal of Therapy:  INR goal 2-2.5 Monitor platelets by anticoagulation protocol: Yes   Plan:   Will order warfarin 1 mg tonight   Monitor  daily INR, CBC, and s/sx of bleeding.  Doylene Canard, PharmD Clinical Pharmacist  Pager: [phone] Phone: [phone]  08/25/2017 7:29 AM

## 2017-08-25 NOTE — Progress Notes (Signed)
Cortrak Team Brief Note   Cortrak Team unable to advance tube post pyloric. Recommend IR placement of small bore post pyloric tube.   Koleen Distance MS, RD, LDN Pager #- [phone] Office#- [phone] After Hours Pager: [phone]

## 2017-08-25 NOTE — Progress Notes (Signed)
CSW met with wife at bedside. She reports she went home for a short break today. Wife spoke at length about hospitalization and her continued positive outlook for patient's recovery. She states her daughter will spend the night with patient on Friday which will allow her to go home and "sleep in my own bed". Wife appears overwhelmed at times with lengthy hospitalization but remains hopeful for full recovery. CSW will continue to follow for support and any other needs as they arise. Raquel Sarna, [place], [place]

## 2017-08-25 NOTE — Progress Notes (Addendum)
Patient ID: [name] [name], male   DOB: 1950/10/19, 67 y.o.   MRN: [id number]     Advanced Heart Failure Rounding Note  PCP-Cardiologist: No primary care provider on file.   Subjective:    Events 6/17 Underwent HM-3 implant -> Unable to close chest with RV failure and high intrathoracic pressure 6/18 Taken back to OR for evacuation of hematoma. NO RVAD needed. Left sternum open  08/19/17 Taken back to OR for sternal closure 6/22 VT => amiodarone started.  6/24 VT/VF=>urgent cardioversion. 6/25 VT  =>urgent cardioversion   Yesterday received 1UPRBCs. Hgb 8.1>8.4. At 6:38am he went back in VT. Requiring urgent shock x1 and conversion to A fib.   Remains intubated/sedated.   On milrinone 0.5/EPI 2 mcg , lasix 15 mg per hour. NO at 10 ppm. Had almost 7L out but only 1 pound down.   Swan CVP ~15  PA 55/26 CO 5.6  CI 2.4   Co-ox 67%    LVAD Interrogation HM 3: Speed: 5200 Flow: 4   PI: 3.2   Power: 4       1 PI overnight.   Objective:   Weight Range: 260 lb 2.3 oz (118 kg) Body mass index is 36.3 kg/m.   Vital Signs:   Temp:  [99.3 F (37.4 C)-100.9 F (38.3 C)] 100.2 F (37.9 C) (06/26 0600) Pulse Rate:  [41-104] 104 (06/26 0600) Resp:  [10-38] 19 (06/26 0600) BP: (93-128)/(71-92) 127/92 (06/26 0000) SpO2:  [80 %-100 %] 99 % (06/26 0600) Arterial Line BP: (73-129)/(60-103) 95/85 (06/26 0600) FiO2 (%):  [40 %] 40 % (06/26 0400) Weight:  [260 lb 2.3 oz (118 kg)] 260 lb 2.3 oz (118 kg) (06/26 0400) Last BM Date: (smear)  Weight change: Filed Weights   08/23/17 1415 08/24/17 0600 08/25/17 0400  Weight: 265 lb 10.5 oz (120.5 kg) 261 lb 3.9 oz (118.5 kg) 260 lb 2.3 oz (118 kg)    Intake/Output:   Intake/Output Summary (Last 24 hours) at 08/25/2017 0721 Last data filed at 08/25/2017 6789 Gross per 24 hour  Intake 4753.11 ml  Output 6700 ml  Net -1946.89 ml      Physical Exam   CVP 14-15 Physical Exam: GENERAL: In bed. On vent HEENT: normal ETT  NECK:  Supple, JVP to jaw  2+ bilaterally, no bruits.  No lymphadenopathy or thyromegaly appreciated.   CARDIAC:  Mechanical heart sounds with LVAD hum present.  LUNGS:  Rhonchi   ABDOMEN:  Soft, round, nontender, positive bowel sounds x4.     LVAD exit site:  Dressing dry and intact.  No erythema or drainage.  Stabilization device present and accurately applied.  Driveline dressing is being changed daily per sterile technique. EXTREMITIES:  Warm and dry, no cyanosis, clubbing, rash. R and LLE 2+ with SCDs.  NEUROLOGIC:  Intubated/sedated GU: foley yellow urine.   Telemetry   A Fib 80s VT this am requiring cardioversion x1.   Labs    CBC Recent Labs    08/23/17 0412 08/24/17 0352 08/25/17 0500  WBC 13.7* 14.3* 13.0*  NEUTROABS 11.0*  --   --   HGB 8.5* 8.1* 8.4*  HCT 26.5* 25.2* 25.9*  MCV 82.6 83.4 83.5  PLT 261 329 381   Basic Metabolic Panel Recent Labs    08/23/17 0412  08/24/17 0352 08/24/17 1745 08/25/17 0500  NA 135   < > 135 136 136  K 3.3*   < > 3.2* 3.3* 2.9*  CL 98*   < > 98 97*  95*  CO2 25   < > _0 GLUCOSE 218*   < > 175* 180* 199*  BUN 23*   < > 33* 40* 42*  CREATININE 1.05   < > 1.18 1.23 1.18  CALCIUM 8.4*   < > 8.4* 8.2* 8.4*  MG 1.8  --  2.1  --  2.0  PHOS 2.4*  --   --   --   --    < > = values in this interval not displayed.   Liver Function Tests Recent Labs    08/24/17 0352 08/25/17 0500  AST 223* 342*  ALT 93* 150*  ALKPHOS 71 77  BILITOT 1.4* 2.0*  PROT 6.6 6.8  ALBUMIN 2.2* 2.3*   No results for input(s): LIPASE, AMYLASE in the last 72 hours. Cardiac Enzymes No results for input(s): CKTOTAL, CKMB, CKMBINDEX, TROPONINI in the last 72 hours.  BNP: BNP (last 3 results) Recent Labs    08/11/17 1438 08/17/17 0314 08/23/17 0024  BNP 529.3* 266.4* 450.5*    ProBNP (last 3 results) Recent Labs    11/12/16 1559  PROBNP 388.0*     D-Dimer No results for input(s): DDIMER in the last 72 hours. Hemoglobin A1C No results  for input(s): HGBA1C in the last 72 hours. Fasting Lipid Panel No results for input(s): CHOL, HDL, LDLCALC, TRIG, CHOLHDL, LDLDIRECT in the last 72 hours. Thyroid Function Tests No results for input(s): TSH, T4TOTAL, T3FREE, THYROIDAB in the last 72 hours.  Invalid input(s): FREET3  Other results:   Imaging    No results found.   Medications:     Scheduled Medications: . aspirin  81 mg Oral Daily  . bisacodyl  10 mg Oral Daily   Or  . bisacodyl  10 mg Rectal Daily  . chlorhexidine gluconate (MEDLINE KIT)  15 mL Mouth Rinse BID  . Chlorhexidine Gluconate Cloth  6 each Topical Daily  . feeding supplement (PRO-STAT SUGAR FREE 64)  30 mL Per Tube BID  . insulin aspart  0-24 Units Subcutaneous Q4H  . insulin detemir  30 Units Subcutaneous BID  . levalbuterol  0.63 mg Nebulization TID  . magnesium oxide  400 mg Oral Daily  . mouth rinse  15 mL Mouth Rinse 10 times per day  . metoCLOPramide (REGLAN) injection  10 mg Intravenous Q6H  . metolazone  5 mg Oral BID  . potassium chloride  20 mEq Oral Once  . potassium chloride  60 mEq Oral TID  . sodium chloride flush  10-40 mL Intracatheter Q12H  . spironolactone  12.5 mg Oral Daily  . Warfarin - Pharmacist Dosing Inpatient   Does not apply q1800    Infusions: . sodium chloride 10 mL/hr at 08/25/17 0600  . sodium chloride    . sodium chloride 10 mL/hr at 08/21/17 0200  . sodium chloride 10 mL/hr at 08/25/17 0644  . amiodarone 30 mg/hr (08/25/17 0600)  . dexmedetomidine (PRECEDEX) IV infusion 1.2 mcg/kg/hr (08/25/17 0600)  . EPINEPHrine 4 mg in dextrose 5% 250 mL infusion (16 mcg/mL) 2 mcg/min (08/25/17 5038)  . famotidine (PEPCID) IV 20 mg (08/24/17 2129)  . feeding supplement (VITAL HIGH PROTEIN) 1,000 mL (08/25/17 0054)  . fentaNYL infusion INTRAVENOUS 400 mcg/hr (08/25/17 0600)  . furosemide (LASIX) infusion 15 mg/hr (08/25/17 0600)  . lactated ringers Stopped (08/18/17 0933)  . lactated ringers 10 mL/hr at 08/25/17 0600   . lactated ringers Stopped (08/19/17 1602)  . meropenem (MERREM) IV 1 g (08/25/17 0627)  .  milrinone 0.5 mcg/kg/min (08/25/17 0606)  . norepinephrine (LEVOPHED) Adult infusion 3 mcg/min (08/25/17 0600)  . potassium chloride 10 mEq (08/25/17 0646)  . potassium chloride    . vancomycin Stopped (08/25/17 0427)    PRN Medications: sodium chloride, acetaminophen, fentaNYL, hydrALAZINE, lactated ringers, midazolam, morphine injection, ondansetron (ZOFRAN) IV, oxyCODONE, sodium chloride flush    Patient Profile  [name] [name] is a 68 y.o. 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   Directly admitted with persistent low cardiac output for milrinone initiation for home.  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 Acute/Chronic systolic CHF with biventricular failure-> cardiogenic shock: Echo  08/13/2017 EF 20-25%. s/p  Medtronic BiV ICD in place. Cath 12/18 with stable 1v CAD.  s/p HM-3 implant 6/17. Unable to close chest due to high-intrathoracic pressures and RV failure. Taken back to OR 08/17/17 with evacuation of hematoma with improvement.   - Remains intubated/sedated. On milrinone 0.5/EPI  2 mcg , lasix 12 mg per hour. Would not extubate until volume status improves.  -CO-OX 67%. Weight down 1 pound. Add ted hose.  Supp K. Continue lasix drip 15 mg per hour - Increase spiro 25 mg per hour to help potassium loss. - - VAD interrogated personally. Parameters stable.  - INR 2.5  Discussed with pharmacy.  2. VAD - s/p HM-3 implant 6/17 - VAD interrogated personally. Parameters stable. - INR 2.5 . Discussed with pharmacy.  - Continue asa 81 mg daily.  - LDH 274>324 >384>425  3. [name]: Recent cath with stable CAD as above - No change to current plan.   4. [name]: Recent A1c 8.3 on 6/3.  - Cover with SSI in house.  4. CKD II-III:    -Creatinine stable 1.2  - Stable.  5. [name]/VF: Recurrent VT on 6/22 S/P VF 6/24 emergent bedside cardioversion.  S/P VT 6/26 emergent bedside cardioversion.  - No VT over night. Continue amiodarone gtt.  - Keep K > 4.0 Mg > 2.0.  - K 2.9  Supp K  Mag 2.0  6. PAF s/p ablation 04/2012: Device interrogation showed A fib since May 16th. Remains in A fib 90s . On amio drip for rate control.  7. Anemia: Post-op.  Got 1 unit PRBCs 6/22 and 6/25 . Hgb 8.4   Add ted hose. Check BMET this at 2:00,  Length of Stay: [place], NP  08/25/2017, 7:21 AM  Advanced Heart Failure Team Pager [phone] (M-F; [place])  Please contact [facility] for night-coverage after hours (4p -7a ) and weekends on [email or web address]  Agree with above.  Remains tenuous. Intubated and sedated. Remains on NE4, EPI 2 and milrinone. Lasix gtt at 15. Attempting to diurese to facilitate extubation. Had excellent diuresis but weight only down 1 pound. CVP 15 . CXR still very wet. (Personally reviewed). Had recurrent VT this am requiring emergent cardioversion in setting of K 2.9  On exam Intubated/sedated RIJ swan JVP to jaw Cor LVAD hum Lungs coarse Ab soft NT ND Ext warm 2+ edema  Difficult situation. Remains volume overloaded. Diuresing very well but we are struggling to keep up with amount of volume he is getting via drips. Lasix now at 15/hr. All drips concentrated. Will increase spiro to 50 bid. Follow electrolytes closely to limit  further VT. Not ready for extubation today. Fever curve improving with switch to meropenem. VAD interrogated personally. Parameters stable. BCx NGTD.   CRITICAL CARE Performed by: Glori Bickers  Total critical care time: 45 minutes  Critical care time was exclusive of separately billable procedures and treating other patients.  Critical care was necessary to treat or prevent imminent or life-threatening deterioration.  Critical care was time spent personally by me (independent  of midlevel providers or residents) on the following activities: development of treatment plan with patient and/or surrogate as well as nursing, discussions with consultants, evaluation of patient's response to treatment, examination of patient, obtaining history from patient or surrogate, ordering and performing treatments and interventions, ordering and review of laboratory studies, ordering and review of radiographic studies, pulse oximetry and re-evaluation of patient's condition.  Glori Bickers, MD  9:15 AM

## 2017-08-25 NOTE — Progress Notes (Signed)
Patient went into vtach at a rate of over 200 at 0638. 200mg  of lidocaine ,  2mg  of versed given, and  53mcg  of fentanyl  bolused from an already infusing 413mcg/hr  Bag. Heart rate dropped transiently to the 90s but picked up into the 200s. Dr bensingom updated and order given to defib. Patient was successfully defibrillated with 120j at [phone]

## 2017-08-26 ENCOUNTER — Inpatient Hospital Stay (HOSPITAL_COMMUNITY): Payer: PPO

## 2017-08-26 ENCOUNTER — Inpatient Hospital Stay (HOSPITAL_BASED_OUTPATIENT_CLINIC_OR_DEPARTMENT_OTHER): Payer: PPO

## 2017-08-26 DIAGNOSIS — Z95811 Presence of heart assist device: Secondary | ICD-10-CM

## 2017-08-26 DIAGNOSIS — I5022 Chronic systolic (congestive) heart failure: Secondary | ICD-10-CM

## 2017-08-26 DIAGNOSIS — J9601 Acute respiratory failure with hypoxia: Secondary | ICD-10-CM

## 2017-08-26 DIAGNOSIS — I509 Heart failure, unspecified: Secondary | ICD-10-CM | POA: Diagnosis not present

## 2017-08-26 LAB — COMPREHENSIVE METABOLIC PANEL
ALT: 163 U/L — ABNORMAL HIGH (ref 0–44)
AST: 318 U/L — ABNORMAL HIGH (ref 15–41)
Albumin: 2.3 g/dL — ABNORMAL LOW (ref 3.5–5.0)
Alkaline Phosphatase: 76 U/L (ref 38–126)
Anion gap: 9 (ref 5–15)
BUN: 50 mg/dL — ABNORMAL HIGH (ref 8–23)
CO2: 33 mmol/L — ABNORMAL HIGH (ref 22–32)
Calcium: 8.6 mg/dL — ABNORMAL LOW (ref 8.9–10.3)
Chloride: 95 mmol/L — ABNORMAL LOW (ref 98–111)
Creatinine, Ser: 1.31 mg/dL — ABNORMAL HIGH (ref 0.61–1.24)
GFR calc Af Amer: >60 mL/min (ref >60)
GFR calc non Af Amer: 55 mL/min — ABNORMAL LOW (ref >60)
Glucose, Bld: 150 mg/dL — ABNORMAL HIGH (ref 70–99)
Potassium: 3.3 mmol/L — ABNORMAL LOW (ref 3.5–5.1)
Sodium: 137 mmol/L (ref 135–145)
Total Bilirubin: 1.6 mg/dL — ABNORMAL HIGH (ref 0.3–1.2)
Total Protein: 7.2 g/dL (ref 6.5–8.1)

## 2017-08-26 LAB — BASIC METABOLIC PANEL
Anion gap: 12 (ref 5–15)
Anion gap: 12 (ref 5–15)
Anion gap: 13 (ref 5–15)
Anion gap: 11 (ref 5–15)
BUN: 49 mg/dL — AB (ref 8–23)
BUN: 54 mg/dL — AB (ref 8–23)
BUN: 57 mg/dL — ABNORMAL HIGH (ref 8–23)
BUN: 56 mg/dL — ABNORMAL HIGH (ref 8–23)
CALCIUM: 8.9 mg/dL (ref 8.9–10.3)
CHLORIDE: 93 mmol/L — AB (ref 98–111)
CHLORIDE: 93 mmol/L — AB (ref 98–111)
CO2: 33 mmol/L — AB (ref 22–32)
CO2: 31 mmol/L (ref 22–32)
CO2: 29 mmol/L (ref 22–32)
CO2: 34 mmol/L — ABNORMAL HIGH (ref 22–32)
CREATININE: 1.27 mg/dL — AB (ref 0.61–1.24)
CREATININE: 1.34 mg/dL — AB (ref 0.61–1.24)
CREATININE: 0.98 mg/dL (ref 0.61–1.24)
Calcium: 8.7 mg/dL — ABNORMAL LOW (ref 8.9–10.3)
Calcium: 8.7 mg/dL — ABNORMAL LOW (ref 8.9–10.3)
Calcium: 9.1 mg/dL (ref 8.9–10.3)
Chloride: 96 mmol/L — ABNORMAL LOW (ref 98–111)
Chloride: 93 mmol/L — ABNORMAL LOW (ref 98–111)
Creatinine, Ser: 1.4 mg/dL — ABNORMAL HIGH (ref 0.61–1.24)
GFR calc Af Amer: >60 mL/min (ref >60)
GFR calc Af Amer: >60 mL/min (ref >60)
GFR calc non Af Amer: 57 mL/min — ABNORMAL LOW (ref >60)
GFR calc non Af Amer: 50 mL/min — ABNORMAL LOW (ref >60)
GFR, EST AFRICAN AMERICAN: 59 mL/min — AB (ref >60)
GFR, EST NON AFRICAN AMERICAN: 53 mL/min — AB (ref >60)
GLUCOSE: 188 mg/dL — AB (ref 70–99)
Glucose, Bld: 153 mg/dL — ABNORMAL HIGH (ref 70–99)
Glucose, Bld: 254 mg/dL — ABNORMAL HIGH (ref 70–99)
Glucose, Bld: 157 mg/dL — ABNORMAL HIGH (ref 70–99)
POTASSIUM: 3.4 mmol/L — AB (ref 3.5–5.1)
POTASSIUM: 4 mmol/L (ref 3.5–5.1)
POTASSIUM: 4 mmol/L (ref 3.5–5.1)
POTASSIUM: 3.7 mmol/L (ref 3.5–5.1)
SODIUM: 138 mmol/L (ref 135–145)
SODIUM: 135 mmol/L (ref 135–145)
SODIUM: 138 mmol/L (ref 135–145)
Sodium: 139 mmol/L (ref 135–145)

## 2017-08-26 LAB — COOXEMETRY PANEL
Carboxyhemoglobin: 1.9 % — ABNORMAL HIGH (ref 0.5–1.5)
Carboxyhemoglobin: 1.8 % — ABNORMAL HIGH (ref 0.5–1.5)
Methemoglobin: 0.9 % (ref 0.0–1.5)
Methemoglobin: 1 % (ref 0.0–1.5)
O2 Saturation: 67 %
O2 Saturation: 57.7 %
Total hemoglobin: 8.4 g/dL — ABNORMAL LOW (ref 12.0–16.0)
Total hemoglobin: 9.9 g/dL — ABNORMAL LOW (ref 12.0–16.0)

## 2017-08-26 LAB — CBC
HCT: 27.6 % — ABNORMAL LOW (ref 39.0–52.0)
Hemoglobin: 8.8 g/dL — ABNORMAL LOW (ref 13.0–17.0)
MCH: 26.6 pg (ref 26.0–34.0)
MCHC: 31.9 g/dL (ref 30.0–36.0)
MCV: 83.4 fL (ref 78.0–100.0)
Platelets: 373 10*3/uL (ref 150–400)
RBC: 3.31 MIL/uL — ABNORMAL LOW (ref 4.22–5.81)
RDW: 16.4 % — ABNORMAL HIGH (ref 11.5–15.5)
WBC: 14.7 10*3/uL — ABNORMAL HIGH (ref 4.0–10.5)

## 2017-08-26 LAB — PROTIME-INR
INR: 2.18
Prothrombin Time: 24.1 seconds — ABNORMAL HIGH (ref 11.4–15.2)

## 2017-08-26 LAB — ECHOCARDIOGRAM COMPLETE
Height: 70.984 in
Weight: 3996.5 oz

## 2017-08-26 LAB — GLUCOSE, CAPILLARY
GLUCOSE-CAPILLARY: 143 mg/dL — AB (ref 70–99)
GLUCOSE-CAPILLARY: 146 mg/dL — AB (ref 70–99)
GLUCOSE-CAPILLARY: 167 mg/dL — AB (ref 70–99)
Glucose-Capillary: 139 mg/dL — ABNORMAL HIGH (ref 70–99)
Glucose-Capillary: 184 mg/dL — ABNORMAL HIGH (ref 70–99)
Glucose-Capillary: 246 mg/dL — ABNORMAL HIGH (ref 70–99)

## 2017-08-26 LAB — PROCALCITONIN: PROCALCITONIN: 0.45 ng/mL

## 2017-08-26 LAB — LACTATE DEHYDROGENASE: LDH: 407 U/L — ABNORMAL HIGH (ref 98–192)

## 2017-08-26 LAB — MAGNESIUM: Magnesium: 2.2 mg/dL (ref 1.7–2.4)

## 2017-08-26 MED ORDER — CLONAZEPAM 0.5 MG PO TBDP
1.0000 mg | ORAL_TABLET | Freq: Two times a day (BID) | ORAL | Status: DC
  Administered 2017-08-26 – 2017-08-30 (×9): 1 mg via ORAL
  Filled 2017-08-26 (×9): qty 2

## 2017-08-26 MED ORDER — CHLORHEXIDINE GLUCONATE CLOTH 2 % EX PADS: 6.0000 | MEDICATED_PAD | Freq: Every day | CUTANEOUS | Status: DC

## 2017-08-26 MED ORDER — WARFARIN SODIUM 3 MG PO TABS
3.0000 mg | ORAL_TABLET | Freq: Once | ORAL | Status: AC
  Administered 2017-08-26: 3 mg via ORAL
  Filled 2017-08-26: qty 1

## 2017-08-26 MED ORDER — QUETIAPINE FUMARATE 25 MG PO TABS
25.0000 mg | ORAL_TABLET | Freq: Two times a day (BID) | ORAL | Status: DC
  Administered 2017-08-26 – 2017-08-30 (×9): 25 mg via ORAL
  Filled 2017-08-26 (×9): qty 1

## 2017-08-26 MED ORDER — SODIUM CHLORIDE 0.9% FLUSH
10.0000 mL | Freq: Two times a day (BID) | INTRAVENOUS | Status: DC
  Administered 2017-08-27 – 2017-08-29 (×4): 10 mL
  Administered 2017-08-29: 20 mL
  Administered 2017-08-30 – 2017-08-31 (×3): 10 mL
  Administered 2017-09-01: 20 mL
  Administered 2017-09-03 – 2017-09-09 (×8): 10 mL
  Administered 2017-09-10: 20 mL

## 2017-08-26 MED ORDER — DEXTROSE 5 % IV SOLN
30.0000 mmol | Freq: Once | INTRAVENOUS | Status: AC
  Administered 2017-08-26: 30 mmol via INTRAVENOUS
  Filled 2017-08-26: qty 10

## 2017-08-26 MED ORDER — POTASSIUM CHLORIDE 20 MEQ/15ML (10%) PO SOLN
30.0000 meq | Freq: Once | ORAL | Status: AC
  Administered 2017-08-26: 30 meq via ORAL
  Filled 2017-08-26: qty 30

## 2017-08-26 MED ORDER — PERFLUTREN LIPID MICROSPHERE
INTRAVENOUS | Status: AC
  Filled 2017-08-26: qty 10

## 2017-08-26 MED ORDER — CHLORHEXIDINE GLUCONATE CLOTH 2 % EX PADS
6.0000 | MEDICATED_PAD | Freq: Every day | CUTANEOUS | Status: DC
  Administered 2017-08-27 – 2017-08-31 (×4): 6 via TOPICAL

## 2017-08-26 MED ORDER — FENTANYL CITRATE (PF) 100 MCG/2ML IJ SOLN: 25.0000 ug | INTRAMUSCULAR | Status: DC | PRN

## 2017-08-26 MED ORDER — METOLAZONE 5 MG PO TABS
10.0000 mg | ORAL_TABLET | Freq: Once | ORAL | Status: AC
  Administered 2017-08-26: 10 mg via ORAL
  Filled 2017-08-26: qty 2

## 2017-08-26 MED ORDER — ACETAZOLAMIDE SODIUM 500 MG IJ SOLR
250.0000 mg | Freq: Four times a day (QID) | INTRAMUSCULAR | Status: AC
  Administered 2017-08-26 (×3): 250 mg via INTRAVENOUS
  Filled 2017-08-26 (×3): qty 250

## 2017-08-26 MED ORDER — PERFLUTREN LIPID MICROSPHERE
3.0000 mL | INTRAVENOUS | Status: AC | PRN
  Filled 2017-08-26: qty 4

## 2017-08-26 MED ORDER — SODIUM CHLORIDE 0.9% FLUSH: 10.0000 mL | INTRAVENOUS | Status: DC | PRN

## 2017-08-26 NOTE — Progress Notes (Signed)
Patient ID: [name] [name], male   DOB: 08/05/1950, 67 y.o.   MRN: [id number]     Advanced Heart Failure Rounding Note  PCP-Cardiologist: No primary care provider on file.   Subjective:    Events 6/17 Underwent HM-3 implant -> Unable to close chest with RV failure and high intrathoracic pressure 6/18 Taken back to OR for evacuation of hematoma. NO RVAD needed. Left sternum open  08/19/17 Taken back to OR for sternal closure 6/22 VT => amiodarone started.  6/24 VT/VF=>urgent cardioversion. 6/25 VT  =>urgent cardioversion   On milrinone 0.5/EPI 2 mcg , lasix 15 mg per hour. NO at 10 ppm. Weight down 11 pounds overnight. Co-ox 67% Potassium 3.3  Weaning vent now. Very agitated. Breathing 30-35x/min. MAPS 60-70s. CXR improved but still with edema.    Swan CVP 11 PA 47/19 CO 4.9 CI 2.2   LVAD Interrogation HM 3: Speed: 5200 Flow: 4.5  PI: 3.4   Power: 3.5  Objective:   Weight Range: 113.3 kg (249 lb 12.5 oz) Body mass index is 34.85 kg/m.   Vital Signs:   Temp:  [99.3 F (37.4 C)-100.6 F (38.1 C)] 99.3 F (37.4 C) (06/27 0500) Pulse Rate:  [64-101] 80 (06/27 0811) Resp:  [13-51] 18 (06/27 0811) BP: (78-120)/(53-84) 95/53 (06/27 0811) SpO2:  [95 %-100 %] 99 % (06/27 0811) Arterial Line BP: (73-121)/(57-78) 75/59 (06/27 0500) FiO2 (%):  [40 %] 40 % (06/27 0811) Weight:  [113.3 kg (249 lb 12.5 oz)] 113.3 kg (249 lb 12.5 oz) (06/27 0500) Last BM Date: (smear)  Weight change: Filed Weights   08/24/17 0600 08/25/17 0400 08/26/17 0500  Weight: 118.5 kg (261 lb 3.9 oz) 118 kg (260 lb 2.3 oz) 113.3 kg (249 lb 12.5 oz)    Intake/Output:   Intake/Output Summary (Last 24 hours) at 08/26/2017 0837 Last data filed at 08/26/2017 0728 Gross per 24 hour  Intake 6239.91 ml  Output 6765 ml  Net -525.09 ml      Physical Exam   CVP 11 General:  Intubated/sedated HEENT: normal ETT Flo-trac Neck: RIJ swan  Carotids 2+ bilat; no bruits. No lymphadenopathy or thryomegaly  appreciated. Cor: LVAD hum.  Lungs: Coarse Abdomen: obese soft, nontender, non-distended. No hepatosplenomegaly. No bruits or masses. Good bowel sounds. Driveline site clean. Anchor in place.  Extremities: no cyanosis, clubbing, rash. Warm trace-1+ edema Neuro: awake on vent agitated    Telemetry   A Fib 90s Personally reviewed   Labs    CBC Recent Labs    08/25/17 0500 08/26/17 0402  WBC 13.0* 14.7*  HGB 8.4* 8.8*  HCT 25.9* 27.6*  MCV 83.5 83.4  PLT 338 469   Basic Metabolic Panel Recent Labs    08/25/17 0500  08/26/17 0214 08/26/17 0402  NA 136   < > 138 137  K 2.9*   < > 3.4* 3.3*  CL 95*   < > 93* 95*  CO2 31   < > 33* 33*  GLUCOSE 199*   < > 153* 150*  BUN 42*   < > 49* 50*  CREATININE 1.18   < > 1.27* 1.31*  CALCIUM 8.4*   < > 8.7* 8.6*  MG 2.0  --   --  2.2   < > = values in this interval not displayed.   Liver Function Tests Recent Labs    08/25/17 0500 08/26/17 0402  AST 342* 318*  ALT 150* 163*  ALKPHOS 77 76  BILITOT 2.0* 1.6*  PROT 6.8 7.2  ALBUMIN 2.3* 2.3*   No results for input(s): LIPASE, AMYLASE in the last 72 hours. Cardiac Enzymes No results for input(s): CKTOTAL, CKMB, CKMBINDEX, TROPONINI in the last 72 hours.  BNP: BNP (last 3 results) Recent Labs    08/11/17 1438 08/17/17 0314 08/23/17 0024  BNP 529.3* 266.4* 450.5*    ProBNP (last 3 results) Recent Labs    11/12/16 1559  PROBNP 388.0*     D-Dimer No results for input(s): DDIMER in the last 72 hours. Hemoglobin A1C No results for input(s): HGBA1C in the last 72 hours. Fasting Lipid Panel No results for input(s): CHOL, HDL, LDLCALC, TRIG, CHOLHDL, LDLDIRECT in the last 72 hours. Thyroid Function Tests No results for input(s): TSH, T4TOTAL, T3FREE, THYROIDAB in the last 72 hours.  Invalid input(s): FREET3  Other results:   Imaging    [place] Ekg Site Rite  Result Date: 08/25/2017 If Site Rite image not attached, placement could not be confirmed due to  current cardiac rhythm.    Medications:     Scheduled Medications: . aspirin  81 mg Oral Daily  . bisacodyl  10 mg Oral Daily   Or  . bisacodyl  10 mg Rectal Daily  . chlorhexidine gluconate (MEDLINE KIT)  15 mL Mouth Rinse BID  . Chlorhexidine Gluconate Cloth  6 each Topical Daily  . feeding supplement (PRO-STAT SUGAR FREE 64)  30 mL Per Tube BID  . insulin aspart  0-24 Units Subcutaneous Q4H  . insulin detemir  30 Units Subcutaneous BID  . levalbuterol  0.63 mg Nebulization TID  . magnesium oxide  400 mg Oral BID  . mouth rinse  15 mL Mouth Rinse 10 times per day  . metoCLOPramide (REGLAN) injection  10 mg Intravenous Q6H  . metolazone  5 mg Oral BID  . potassium chloride  60 mEq Oral TID  . sodium chloride flush  10-40 mL Intracatheter Q12H  . spironolactone  50 mg Oral BID  . Warfarin - Pharmacist Dosing Inpatient   Does not apply q1800    Infusions: . sodium chloride 10 mL/hr at 08/26/17 0700  . sodium chloride    . sodium chloride 10 mL/hr at 08/21/17 0200  . sodium chloride Stopped (08/25/17 1518)  . amiodarone 30 mg/hr (08/26/17 0700)  . dexmedetomidine (PRECEDEX) IV infusion 1.2 mcg/kg/hr (08/26/17 0728)  . EPINEPHrine 4 mg in dextrose 5% 250 mL infusion (16 mcg/mL) 2 mcg/min (08/26/17 0728)  . famotidine (PEPCID) IV Stopped (08/25/17 2147)  . feeding supplement (VITAL HIGH PROTEIN) 1,000 mL (08/26/17 0410)  . fentaNYL infusion INTRAVENOUS 250 mcg/hr (08/26/17 0700)  . furosemide (LASIX) infusion 15 mg/hr (08/26/17 0700)  . lactated ringers Stopped (08/18/17 0933)  . lactated ringers 10 mL/hr at 08/26/17 0700  . lactated ringers Stopped (08/19/17 1602)  . meropenem (MERREM) IV Stopped (08/26/17 7793)  . milrinone 0.5 mcg/kg/min (08/26/17 0717)  . norepinephrine (LEVOPHED) Adult infusion 2 mcg/min (08/26/17 0700)  . vancomycin 150 mL/hr at 08/26/17 0300    PRN Medications: sodium chloride, acetaminophen (TYLENOL) oral liquid 160 mg/5 mL, fentaNYL,  hydrALAZINE, lactated ringers, midazolam, morphine injection, ondansetron (ZOFRAN) IV, oxyCODONE, sodium chloride flush    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   Directly admitted with persistent low cardiac output for milrinone initiation for home.  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 Acute/Chronic systolic CHF with biventricular failure-> cardiogenic shock: Echo  08/13/2017 EF 20-25%. s/p  Medtronic BiV ICD in place. Cath 12/18 with stable 1v CAD.  s/p HM-3 implant 6/17. Unable to close chest due to high-intrathoracic pressures and RV failure. Taken back to OR 08/17/17 with evacuation of hematoma with improvement.   - Remains intubated/sedated. On milrinone 0.5/EPI  2 mcg , lasix 12 mg per hour. - Have been holding off on extubating due to volume overload and pulmonary edema Great urine output however only down 1L but weight shows down 11 pounds. Now about 5-7 pounds from pre-op weight - Continue IV diuresis with lasix gtt, metolazone, high-dose spiro (to spare K) today - CCM to evaluate for extubation. May need one more day - VAD interrogated personally. Parameters stable. 2. VAD - s/p HM-3 implant 6/17 - VAD interrogated personally. Parameters stable. - INR 2.1 Discussed dosing with PharmD personally. - Continue asa 81 mg daily.  - LDH 274>324 >384>425> 407 3. Acute respiratory failure post-op - discussion as above. Hope to wean vent soon 4. CKD II-III:  -Creatinine stable 1.2-1.3 -Follow 5. [name]/VF: Recurrent VT on 6/22 -S/P VF 6/24 emergent bedside cardioversion.  -S/P VT 6/26 emergent bedside cardioversion.  - No VT over night. Continue amiodarone gtt.  - Keep K > 4.0 Mg > 2.0.  - Spiro increased to 50 bid. Supplementation discussed with PharmD 6. AFL - s/p previous ablation.  Well-tolerated 7. [name]: Recent cath with stable CAD as above - No change to current plan.   8. [name]: Recent A1c 8.3 on 6/3.  - Cover with SSI in house.  9. Anemia:  - Post-op.  Got 1 unit PRBCs 6/22 and 6/25 . Hgb 8.8 10. Fevers  - still with low-grade temps and leukocytosis. CX negative - on meropenem  CRITICAL CARE Performed by: Glori Bickers  Total critical care time: 45 minutes  Critical care time was exclusive of separately billable procedures and treating other patients.  Critical care was necessary to treat or prevent imminent or life-threatening deterioration.  Critical care was time spent personally by me (independent of midlevel providers or residents) on the following activities: development of treatment plan with patient and/or surrogate as well as nursing, discussions with consultants, evaluation of patient's response to treatment, examination of patient, obtaining history from patient or surrogate, ordering and performing treatments and interventions, ordering and review of laboratory studies, ordering and review of radiographic studies, pulse oximetry and re-evaluation of patient's condition.   Length of Stay: [place], MD  08/26/2017, 8:37 AM  Advanced Heart Failure Team Pager [phone] (M-F; 7a - 4p)  Please contact [facility] for night-coverage after hours (4p -7a ) and weekends on [email or web address]

## 2017-08-26 NOTE — Progress Notes (Signed)
PULMONARY / CRITICAL CARE MEDICINE   Name: [name] [name] MRN: [id number] DOB: 03-25-1950    ADMISSION DATE:  08/11/2017 CONSULTATION DATE:  08/25/2017  REFERRING MD:  Dr. [name]  CHIEF COMPLAINT:  Difficultly weaning from vent, vent management  HISTORY OF PRESENT ILLNESS:   [name] [name] is a 67 y.o. Male with a PMH of Ischemic cardiomyopathy, chronic systolic HF, CAD, s/p AICD, HTN, CKD, and DM II who was admitted to [facility] 08/11/17 for initiation of Milrinone due to persistent low cardiac output.  Upon admission, he was noted to have marked volume overload, therefore IV diuresis initiated.  On 08/17/17 he underwent implantation of HeartMate 3 LVAD.  His hospital course has been complicated by cardiogenic shock in setting of Acute on Chronic Systolic CHF with biventricluar failure, Ventilator dependent respiratory failure, and episodes of V-tach/V-fib requiring cardioversion.   PCCM is consulted 08/25/17 to assist with ventilator management and ventilator weaning.  SUBJECTIVE:  No events overnight, no new complaints  VITAL SIGNS: BP (!) 95/53   Pulse 80   Temp 99 F (37.2 C)   Resp 18   Ht 5' 10.98" (1.803 m)   Wt 249 lb 12.5 oz (113.3 kg)   SpO2 99%   BMI 34.85 kg/m   HEMODYNAMICS: PAP: (42-62)/(17-29) 47/21 CVP:  [11 mmHg-17 mmHg] 11 mmHg CO:  [5 L/min-5.8 L/min] 5.8 L/min CI:  [2.2 L/min/m2-2.6 L/min/m2] 2.6 L/min/m2  VENTILATOR SETTINGS: Vent Mode: PSV;CPAP FiO2 (%):  [40 %] 40 % Set Rate:  [16 bmp] 16 bmp Vt Set:  [550 mL-600 mL] 550 mL PEEP:  [5 cmH20] 5 cmH20 Pressure Support:  [10 cmH20] 10 cmH20 Plateau Pressure:  [16 cmH20-22 cmH20] 16 cmH20  INTAKE / OUTPUT: I/O last 3 completed shifts: In: 8292.6 [I.V.:4553.2; NG/GT:2297.7; IV Piggyback:1441.8] Out: 01093 [Urine:11765]  PHYSICAL EXAMINATION: General:  Acutely ill appearing male, NAD Neuro:  [place], moving all ext to command HEENT:  [place]/AT, PERRL, EOM-I and MMM Cardiovascular:  IRIR, Nl S1/S2 and  -M/R/G Lungs:  Coarse BS diffusely Abdomen:  Obese, soft, NT, ND and +BS Musculoskeletal:  2+ edema and -tenderness Skin:  Dry, warm. No cyanosis.  LABS:  BMET Recent Labs  Lab 08/25/17 2006 08/26/17 0214 08/26/17 0402  NA 135 138 137  K 4.9 3.4* 3.3*  CL 93* 93* 95*  CO2 31 33* 33*  BUN 48* 49* 50*  CREATININE 1.23 1.27* 1.31*  GLUCOSE 197* 153* 150*   Electrolytes Recent Labs  Lab 08/21/17 0335  08/22/17 0406 08/23/17 0412  08/24/17 0352  08/25/17 0500  08/25/17 2006 08/26/17 0214 08/26/17 0402  CALCIUM 8.2*   < > 8.0* 8.4*   < > 8.4*   < > 8.4*   < > 8.2* 8.7* 8.6*  MG  --    < > 2.0 1.8  --  2.1  --  2.0  --   --   --  2.2  PHOS 2.7  --  2.4* 2.4*  --   --   --   --   --   --   --   --    < > = values in this interval not displayed.   CBC Recent Labs  Lab 08/24/17 0352 08/25/17 0500 08/26/17 0402  WBC 14.3* 13.0* 14.7*  HGB 8.1* 8.4* 8.8*  HCT 25.2* 25.9* 27.6*  PLT 329 338 373   Coag's Recent Labs  Lab 08/24/17 0352 08/25/17 0500 08/26/17 0402  INR 2.97 2.54 2.18   Sepsis Markers Recent Labs  Lab  08/25/17 1310 08/26/17 0402  PROCALCITON 0.53 0.45   ABG Recent Labs  Lab 08/23/17 0418 08/24/17 0411 08/25/17 0620  PHART 7.433 7.468* 7.465*  PCO2ART 36.0 39.5 44.8  PO2ART 80.0* 68.4* 75.7*   Liver Enzymes Recent Labs  Lab 08/24/17 0352 08/25/17 0500 08/26/17 0402  AST 223* 342* 318*  ALT 93* 150* 163*  ALKPHOS 71 77 76  BILITOT 1.4* 2.0* 1.6*  ALBUMIN 2.2* 2.3* 2.3*    Cardiac Enzymes No results for input(s): TROPONINI, PROBNP in the last 168 hours.  Glucose Recent Labs  Lab 08/25/17 1634 08/25/17 1930 08/25/17 1959 08/25/17 2356 08/26/17 0413 08/26/17 0818  GLUCAP 190* 197* 185* 167* 139* 146*    Imaging Dg Chest Port 1 View  Result Date: 08/26/2017 CLINICAL DATA:  Follow-up LVAD EXAM: PORTABLE CHEST 1 VIEW COMPARISON:  08/25/2017 FINDINGS: Left ventricular assist device and defibrillator are again noted.  Nasogastric catheter and endotracheal tube are again seen. Feeding catheter has been removed in the interval. Gordy Councilman catheter is again noted in the pulmonary artery centrally. Right jugular central line is also noted. Patchy infiltrative changes are again seen bilaterally with some improved aeration in the right upper lobe. Stable vascular congestion is noted. IMPRESSION: Slight improved aeration in the right upper lobe. The remainder of the exam is stable. Electronically Signed   By: Inez Catalina M.D.   On: 08/26/2017 09:03   [place] Ekg Site Rite  Result Date: 08/25/2017 If Site Rite image not attached, placement could not be confirmed due to current cardiac rhythm.    STUDIES: ECHO 6/96>> Systolic function was severely reduced. The estimated ejection fraction was in the range of 20% to 25%. Akinesis of the apicalanteroseptal, inferoseptal, and apical myocardium. Dyskinesis of the inferior myocardium. The right atrium was mildly dilated. PA peak pressure:48 mm Hg  Cardiac cath 08/13/17>> Mildly elevated filling pressures and normalized cardiac output on milrinone 0.375  CULTURES: Surgical/wound 6/17>> Sputum 6/22>> negative Blood culture 6/25>> Sputum 6/25>>  ANTIBIOTICS: Levofloxacin 6/18>>6/25 Meropenem 6/25>> Vancomycin 6/25>>  SIGNIFICANT EVENTS: 6/17>>Underwent HM-3 implant>>unable to close chest with RV failure and high intrathoracic pressure 6/18>> Take back to OR for evacuation of hematoma. No RVAD needed. Sternum left open 6/20>> Taken back to OR for sternal closure 6/22>> VT, amiodarone infusion started 6/24>> VT/VF, underwent urgent cardioversion 6/25>> VT, underwent urgent cardioversion 6/26>> PCCM consulted for vent management/weaning  LINES/TUBES: R IJ CVC 6/17>> ETT 6/17>>  DISCUSSION: 67 y.o,.with Cardiogenic shock in setting of Acute on Chronic Systolic CHF with biventricluar failure, s/p LVAD placement, volume overload and Ventilator dependent respiratory  failure, and episodes of V-tach/V-fib requiring cardioversion.     ASSESSMENT / PLAN:  PULMONARY A: Acute hypoxic respiratory failure in setting of Acute on Chronic Systolic CHF with Biventricular failure ? RUL PNA (VAP) P:   Change vent to PCV F/U ABG post vent change Hold weaning for today VAP bundle Pulmonary hygienes  Follow Cultures as above Abx as above Trend PCT F/u CXR in am 6/28  CARDIOVASCULAR A:  Acute on Chronic systolic CHF with Biventricular Failure Cardiogenic shock Intermittent episodes of V-tach/Vfib Hx: CAD s/p PCI of RCA & LAD -Echo 08/13/17>> EF 20-25% -s/p Medtronic BiV ICD in place -s/p LVAD (HM-3) implant 6/17 PAF P:  Heart Failure team & Cardiothoracic Surgery following, appreciate input Continue Milrinone, Epinephrine, Levophed per Cardiology Maintain MAP >65 Continue Amiodarone gtt IV diuresis with Lasix gtt 15 mg/hr  RENAL A:   CKD II-III Metabolic alkalosis, likely in setting of aggressive diuresis  Hypokalemia P:   Monitor I&O's / urine output Trend BMP Ensure adequate renal perfusion Avoid nephrotoxic agents as able Diamox and zaroxolyn added to lasix drip  GASTROINTESTINAL A:   Hypoalbuminemia P:   NPO Tube feeds, rate adjustment per dietician  HEMATOLOGIC A:   Anemia P:  Monitor for s/sx of bleeding Trend CBC S/p 1 unit PRBCs 6/25 Transfuse for Hgb<7  INFECTIOUS A:   Leukocytosis Fever ? RUL PNA (VAP) P:   Monitor fever curve Trend WBCs Check PCT Follow cultures Merrem and vanc Follow CXR  ENDOCRINE A:   Hyperglycemia Hx: DM II P:   CBG's SSI Continue Levemir  NEUROLOGIC A:   Sedation needs for mechanical ventilation P:   RASS goal: 0 to -1 Daily WUA Add seroquel and clonazepam PO to decrease precedex D/C fentanyl drip Fentanyl to pushes  Provide supportive care Lights on during the day  FAMILY  - Updates: No family bedside 6/27  - Inter-disciplinary family meet or Palliative Care  meeting due by:  09/01/2017  The patient is critically ill with multiple organ systems failure and requires high complexity decision making for assessment and support, frequent evaluation and titration of therapies, application of advanced monitoring technologies and extensive interpretation of multiple databases.   Critical Care Time devoted to patient care services described in this note is  36  Minutes. This time reflects time of care of this signee Dr Jennet Maduro. This critical care time does not reflect procedure time, or teaching time or supervisory time of PA/NP/Med student/Med Resident etc but could involve care discussion time.  Rush Farmer, M.D. [facility] Pulmonary/Critical Care Medicine. Pager: [phone]. After hours pager: [phone].

## 2017-08-26 NOTE — Progress Notes (Signed)
Nutrition Follow-up  DOCUMENTATION CODES:   Obesity unspecified  INTERVENTION:   Tube Feeding:  Continue Vital High Protein at rate of 50 ml/hr Continue Pro-Stat 30 mL BID Provides 1640 kcals, 157 g of protein, 1210 mL of free water  NUTRITION DIAGNOSIS:   Inadequate oral intake related to inability to eat as evidenced by NPO status.  Being addressed via TF  GOAL:   Provide needs based on ASPEN/SCCM guidelines  Met  MONITOR:   TF tolerance, Vent status, Labs, Weight trends, Skin, I & O's  REASON FOR ASSESSMENT:   Consult LVAD Eval  ASSESSMENT:   67 yo male admitted with acute on chronic CHF, pt being worked up for destination LVAD. Pt with hx of CHF due to ICM, CAD s/p PCI, PAD, DM, HTN, HL, CKD II/III  Pt remains on vent support, holding vent wean for today Remains on pressor support  Cortrak tube removed yesterday due to "hole" in tubing that occurred while trying to unclog tube by nursing. Cortrak team attempted to place post-pyloric placement with new tube but unsuccessful.   Vital High Protein @ 50 ml/hr, Pro-Stat 30 mL BID via OG tube.  No signs of intolerance to gastric feeding  Weight down but back to admission weight of 249 pounds post diuresis  Labs: reviewed Meds: lasix drip, precedex, ss novolog, reglan, levophed, epinpehrine  Diet Order:   Diet Order           Diet NPO time specified  Diet effective now          EDUCATION NEEDS:   Education needs have been addressed  Skin:  Skin Assessment: Reviewed RN Assessment  Last BM:  6/17  Height:   Ht Readings from Last 1 Encounters:  08/13/17 5' 10.98" (1.803 m)    Weight:   Wt Readings from Last 1 Encounters:  08/26/17 249 lb 12.5 oz (113.3 kg)    Ideal Body Weight:     BMI:  Body mass index is 34.85 kg/m.  Estimated Nutritional Needs:   Kcal:  4259-5638  Protein:  150-165 gm  Fluid:  per MD   Kerman Passey MS, RD, LDN, CNSC [phone] Pager  [phone]  Weekend/On-Call Pager

## 2017-08-26 NOTE — Progress Notes (Signed)
Peripherally Inserted Central Catheter/Midline Placement  The IV Nurse has discussed with the patient and/or persons authorized to consent for the patient, the purpose of this procedure and the potential benefits and risks involved with this procedure.  The benefits include less needle sticks, lab draws from the catheter, and the patient may be discharged home with the catheter. Risks include, but not limited to, infection, bleeding, blood clot (thrombus formation), and puncture of an artery; nerve damage and irregular heartbeat and possibility to perform a PICC exchange if needed/ordered by physician.  Alternatives to this procedure were also discussed.  Bard Power PICC patient education guide, fact sheet on infection prevention and patient information card has been provided to patient /or left at bedside.    PICC/Midline Placement Documentation  PICC Double Lumen 08/26/17 PICC Right Brachial 45 cm 0 cm (Active)  Indication for Insertion or Continuance of Line Vasoactive infusions 08/26/2017 10:35 AM  Exposed Catheter (cm) 0 cm 08/26/2017 10:35 AM  Site Assessment Clean;Dry;Intact 08/26/2017 10:35 AM  Lumen #1 Status Flushed;Blood return noted 08/26/2017 10:35 AM  Lumen #2 Status Flushed;Blood return noted 08/26/2017 10:35 AM  Dressing Type Transparent 08/26/2017 10:35 AM  Dressing Status Clean;Dry;Intact;Antimicrobial disc in place 08/26/2017 10:35 AM  Dressing Intervention New dressing 08/26/2017 10:35 AM  Dressing Change Due 09/02/17 08/26/2017 10:35 AM    Telephone consent signed by wife   Synthia Innocent 08/26/2017, 10:36 AM

## 2017-08-26 NOTE — Progress Notes (Signed)
CSW met briefly with patient's wife at bedside. Wife spent the night and reports patient had a good night. Wife appears tired and fatigued although she continues to smile and remain positive about recovery. CSW offered support and will continue to follow throughout implant hospitalization. Raquel Sarna, [place], [place]

## 2017-08-26 NOTE — Progress Notes (Signed)
Exira for coumadin Indication: LVAD  Allergies  Allergen Reactions  . Penicillins Hives, Itching and Other (See Comments)    Has patient had a PCN reaction causing immediate rash, facial/tongue/throat swelling, SOB or lightheadedness with hypotension:# # Yes # # Has patient had a PCN reaction causing severe rash involving mucus membranes or skin necrosis: No Has patient had a PCN reaction that required hospitalization: No Has patient had a PCN reaction occurring within the last 10 years: No If all of the above answers are "NO", then may proceed with Cephalosporin use.     Patient Measurements: Height: 5' 10.98" (180.3 cm) Weight: 249 lb 12.5 oz (113.3 kg) IBW/kg (Calculated) : 75.26 Heparin Dosing Weight: 98.3 kg  Vital Signs: Temp: 98.8 F (37.1 C) (06/27 1300) Temp Source: Oral (06/27 1221) BP: 77/67 (06/27 1200) Pulse Rate: 85 (06/27 1300)  Labs: Recent Labs    08/24/17 0352  08/25/17 0500  08/26/17 0214 08/26/17 0402 08/26/17 1114  HGB 8.1*  --  8.4*  --   --  8.8*  --   HCT 25.2*  --  25.9*  --   --  27.6*  --   PLT 329  --  338  --   --  373  --   LABPROT 30.7*  --  27.1*  --   --  24.1*  --   INR 2.97  --  2.54  --   --  2.18  --   CREATININE 1.18   < > 1.18   < > 1.27* 1.31* 1.34*   < > = values in this interval not displayed.    Estimated Creatinine Clearance: 68.5 mL/min (A) (by C-G formula based on SCr of 1.34 mg/dL (H)).   Medical History: Past Medical History:  Diagnosis Date  . AICD (automatic cardioverter/defibrillator) present 02/05/2014   Upgrade to Medtronic biventricular ICD, serial number  [id number]   . Atrial flutter ([place]) 04/2012   s/p TEE-EPS+RFCA 04/2012  . CAD (coronary artery disease) 5625,6389 X 2    RCA-T, 70% PL (off CFX), 99% Prox LAD/90% Dist LAD, S/P TAXUS stent x 2  . CHF (congestive heart failure) ([place])   . Chronic anticoagulation   . Chronic systolic heart failure ([place])   . CKD  (chronic kidney disease)   . Diabetic retinopathy ([place])   . DM type 2 (diabetes mellitus, type 2) (H[facility])    insulin dependent  . HTN (hypertension)   . Hypercholesteremia    ablation  . ICD (implantable cardiac defibrillator) in place   . Ischemic cardiomyopathy March 2015   20-25% 2D   . Nephrolithiasis   . Ventricular tachycardia (H[facility])     Medications:  Scheduled:  . acetaZOLAMIDE  250 mg Intravenous Q6H  . aspirin  81 mg Oral Daily  . bisacodyl  10 mg Oral Daily   Or  . bisacodyl  10 mg Rectal Daily  . chlorhexidine gluconate (MEDLINE KIT)  15 mL Mouth Rinse BID  . [START ON 08/27/2017] Chlorhexidine Gluconate Cloth  6 each Topical Daily  . clonazepam  1 mg Oral BID  . feeding supplement (PRO-STAT SUGAR FREE 64)  30 mL Per Tube BID  . insulin aspart  0-24 Units Subcutaneous Q4H  . insulin detemir  30 Units Subcutaneous BID  . levalbuterol  0.63 mg Nebulization TID  . magnesium oxide  400 mg Oral BID  . mouth rinse  15 mL Mouth Rinse 10 times per day  . metoCLOPramide (REGLAN)  injection  10 mg Intravenous Q6H  . perflutren lipid microspheres (DEFINITY) IV suspension      . potassium chloride  60 mEq Oral TID  . QUEtiapine  25 mg Oral BID  . sodium chloride flush  10-40 mL Intracatheter Q12H  . spironolactone  50 mg Oral BID  . warfarin  3 mg Oral ONCE-1800  . Warfarin - Pharmacist Dosing Inpatient   Does not apply q1800    Assessment: 20 yom who underwent LVAD placement on 6/17. Heparin infusion has been running at 900 units/hr per physician. Underwent sternal closure yesterday on 6/20. Pharmacy resume heparin dosing post-procedure.  Heparin was stopped with INR 2.64 after 3 doses of warfarin. Today INR decreased to 2.18. Hgb up slightly to 8.8. LDH back down a little to 407. No s/sx of bleeding. On concurrent amiodarone IV, which can impact INR sensitivity. Of, note LFTs trending upwards.  Goal of Therapy:  INR goal 2-2.5 Monitor platelets by anticoagulation protocol:  Yes   Plan:   Will order warfarin 3 mg tonight   Monitor daily INR, CBC, and s/sx of bleeding.  Marguerite Olea, [facility] Clinical Pharmacist Phone [phone]  08/26/2017 2:31 PM

## 2017-08-26 NOTE — Progress Notes (Signed)
Patient ID: ALEXANDERJAMES BERG, male   DOB: [birth year], 67 y.o.   MRN: [id number] HeartMate 3 Rounding Note  Subjective:    Remains sedated on vent. Weaning per CCM now. Calm at this time.  Hemodynamics have been stable on Milrinone 0.5, epi 2, NE 2.  PA 39/18 CVP 12 CI 2.2 Co-ox 67%   Excellent diuresis on Lasix 15 and metolazone. -3400 cc yesterday. Weight down 11 lbs from yesterday.  LVAD INTERROGATION:  HeartMate IIl LVAD:  Flow 4.5, speed 5200, power 3.5, PI 3.3.     Objective:    Vital Signs:   Temp:  [98.6 F (37 C)-100.6 F (38.1 C)] 99.3 F (37.4 C) (06/27 1127) Pulse Rate:  [46-101] 87 (06/27 1138) Resp:  [13-38] 18 (06/27 1138) BP: (78-120)/(53-84) 98/58 (06/27 1138) SpO2:  [95 %-100 %] 100 % (06/27 1138) Arterial Line BP: (71-121)/(51-78) 71/55 (06/27 1127) FiO2 (%):  [40 %] 40 % (06/27 1138) Weight:  [113.3 kg (249 lb 12.5 oz)] 113.3 kg (249 lb 12.5 oz) (06/27 0500) Last BM Date: 08/22/17 Mean arterial Pressure 70's  Intake/Output:   Intake/Output Summary (Last 24 hours) at 08/26/2017 1145 Last data filed at 08/26/2017 1130 Gross per 24 hour  Intake 4947.48 ml  Output 8515 ml  Net -3567.52 ml     Physical Exam: General:  Intubated and sedated on vent.  HEENT: ETT Cor: Distant irregular heart sounds with LVAD hum present. Lungs: slightly coarse  Abdomen: soft, nontender, mildly distended. No hepatosplenomegaly. No bruits or masses.  Good bowel sounds. driveline dressing intact with anchor.Abdominal incision and drive line site ok.   Extremities: anasarca much improved. Neuro: sedated, calm.   Telemetry: atrial fib 70's on amio drip  Labs: Basic Metabolic Panel: Recent Labs  Lab 08/20/17 0429  08/21/17 0335  08/22/17 0406 08/23/17 0412  08/24/17 0352  08/25/17 0500 08/25/17 1310 08/25/17 2006 08/26/17 0214 08/26/17 0402  NA 134*   < > 132*   < > 133* 135   < > 135   < > 136 138 135 138 137  K 3.9   < > 3.7   < > 4.0 3.3*   < > 3.2*   < >  2.9* 3.6 4.9 3.4* 3.3*  CL 102   < > 100*   < > 102 98*   < > 98   < > 95* 97* 93* 93* 95*  CO2 24   < > 23   < > 22 25   < > 27   < > '31 31 31 ' 33* 33*  GLUCOSE 197*   < > 238*   < > 232* 218*   < > 175*   < > 199* 168* 197* 153* 150*  BUN 5*   < > 11   < > 18 23*   < > 33*   < > 42* 45* 48* 49* 50*  CREATININE 0.86   < > 0.93   < > 0.98 1.05   < > 1.18   < > 1.18 1.19 1.23 1.27* 1.31*  CALCIUM 8.3*   < > 8.2*   < > 8.0* 8.4*   < > 8.4*   < > 8.4* 8.7* 8.2* 8.7* 8.6*  MG 1.6*  --   --    < > 2.0 1.8  --  2.1  --  2.0  --   --   --  2.2  PHOS 3.0  --  2.7  --  2.4* 2.4*  --   --   --   --   --   --   --   --    < > =  values in this interval not displayed.    Liver Function Tests: Recent Labs  Lab 08/22/17 0406 08/23/17 0412 08/24/17 0352 08/25/17 0500 08/26/17 0402  AST 48* 110* 223* 342* 318*  ALT 18 44 93* 150* 163*  ALKPHOS 58 67 71 77 76  BILITOT 1.2 1.4* 1.4* 2.0* 1.6*  PROT 6.0* 6.9 6.6 6.8 7.2  ALBUMIN 2.3* 2.3* 2.2* 2.3* 2.3*   No results for input(s): LIPASE, AMYLASE in the last 168 hours. No results for input(s): AMMONIA in the last 168 hours.  CBC: Recent Labs  Lab 08/20/17 0429 08/21/17 0335  08/22/17 0406 08/23/17 0412 08/24/17 0352 08/25/17 0500 08/26/17 0402  WBC 9.1 7.8   < > 8.8 13.7* 14.3* 13.0* 14.7*  NEUTROABS 7.7 6.0  --  6.5 11.0*  --   --   --   HGB 8.3* 7.9*   < > 8.4* 8.5* 8.1* 8.4* 8.8*  HCT 26.4* 24.7*   < > 26.4* 26.5* 25.2* 25.9* 27.6*  MCV 85.2 83.7   < > 84.9 82.6 83.4 83.5 83.4  PLT 134* 147*   < > 194 261 329 338 373   < > = values in this interval not displayed.    INR: Recent Labs  Lab 08/22/17 0406 08/23/17 0412 08/24/17 0352 08/25/17 0500 08/26/17 0402  INR 1.62 2.64 2.97 2.54 2.18    Other results:  EKG:   Imaging: Dg Chest Port 1 View  Result Date: 08/26/2017 CLINICAL DATA:  Right PICC line placement EXAM: PORTABLE CHEST 1 VIEW COMPARISON:  August 26, 2017 FINDINGS: There is a new right PICC line. The distal tip is  obscured by other lines but appears to terminate in the right heart, approximately 3.4 cm below the caval atrial junction. The ETT, PA catheter, LVAD, pacemaker, and other support apparatus are stable. Continued effusion and opacity in the left base. Right greater than left opacities have improved. IMPRESSION: 1. The distal tip of the right PICC line appears to be in the right atrium, approximately 3.4 cm above the caval atrial junction. Consider repositioning. 2. Right greater than left opacities have improved, possibly asymmetric edema. 3. Other support apparatus is stable. 4. Probable effusion and opacity in left base, unchanged. Electronically Signed   By: Dorise Bullion III M.D   On: 08/26/2017 11:36   Dg Chest Port 1 View  Result Date: 08/26/2017 CLINICAL DATA:  Follow-up LVAD EXAM: PORTABLE CHEST 1 VIEW COMPARISON:  08/25/2017 FINDINGS: Left ventricular assist device and defibrillator are again noted. Nasogastric catheter and endotracheal tube are again seen. Feeding catheter has been removed in the interval. Gordy Councilman catheter is again noted in the pulmonary artery centrally. Right jugular central line is also noted. Patchy infiltrative changes are again seen bilaterally with some improved aeration in the right upper lobe. Stable vascular congestion is noted. IMPRESSION: Slight improved aeration in the right upper lobe. The remainder of the exam is stable. Electronically Signed   By: Inez Catalina M.D.   On: 08/26/2017 09:03   Dg Chest Port 1 View  Result Date: 08/25/2017 CLINICAL DATA:  Check LVAD EXAM: PORTABLE CHEST 1 VIEW COMPARISON:  08/24/2017 FINDINGS: Cardiac shadow is again enlarged. Left ventricular assist device is again noted. Defibrillator is again seen and stable. Endotracheal tube, nasogastric catheter and feeding catheter are again noted and stable. Swan-Ganz catheter and right jugular central line are again seen and stable. Lungs are hypoinflated with accentuation of the vascular  markings relatively stable from the prior exam. Some increasing density  in the right upper lobe is noted consistent with wall the infiltrate. Continued follow-up is recommended IMPRESSION: Increasing right upper lobe infiltrate. Poor inspiratory effort with underlying vascular congestion. Tubes and lines as described above stable from the previous day. Electronically Signed   By: Inez Catalina M.D.   On: 08/25/2017 09:15   [place] Ekg Site Rite  Result Date: 08/25/2017 If Site Rite image not attached, placement could not be confirmed due to current cardiac rhythm.    Medications:     Scheduled Medications: . acetaZOLAMIDE  250 mg Intravenous Q6H  . aspirin  81 mg Oral Daily  . bisacodyl  10 mg Oral Daily   Or  . bisacodyl  10 mg Rectal Daily  . chlorhexidine gluconate (MEDLINE KIT)  15 mL Mouth Rinse BID  . Chlorhexidine Gluconate Cloth  6 each Topical Daily  . clonazepam  1 mg Oral BID  . feeding supplement (PRO-STAT SUGAR FREE 64)  30 mL Per Tube BID  . insulin aspart  0-24 Units Subcutaneous Q4H  . insulin detemir  30 Units Subcutaneous BID  . levalbuterol  0.63 mg Nebulization TID  . magnesium oxide  400 mg Oral BID  . mouth rinse  15 mL Mouth Rinse 10 times per day  . metoCLOPramide (REGLAN) injection  10 mg Intravenous Q6H  . potassium chloride  60 mEq Oral TID  . QUEtiapine  25 mg Oral BID  . sodium chloride flush  10-40 mL Intracatheter Q12H  . sodium chloride flush  10-40 mL Intracatheter Q12H  . spironolactone  50 mg Oral BID  . warfarin  3 mg Oral ONCE-1800  . Warfarin - Pharmacist Dosing Inpatient   Does not apply q1800    Infusions: . sodium chloride 10 mL/hr at 08/26/17 1100  . sodium chloride    . sodium chloride 10 mL/hr at 08/21/17 0200  . sodium chloride Stopped (08/25/17 1518)  . amiodarone 30 mg/hr (08/26/17 1100)  . dexmedetomidine (PRECEDEX) IV infusion 0.8 mcg/kg/hr (08/26/17 1100)  . EPINEPHrine 4 mg in dextrose 5% 250 mL infusion (16 mcg/mL) 2 mcg/min  (08/26/17 1100)  . famotidine (PEPCID) IV Stopped (08/26/17 0940)  . feeding supplement (VITAL HIGH PROTEIN) 1,000 mL (08/26/17 0410)  . furosemide (LASIX) infusion 15 mg/hr (08/26/17 1100)  . lactated ringers Stopped (08/18/17 0933)  . lactated ringers 10 mL/hr at 08/26/17 1100  . lactated ringers Stopped (08/19/17 1602)  . meropenem (MERREM) IV Stopped (08/26/17 6712)  . milrinone 0.5 mcg/kg/min (08/26/17 1100)  . norepinephrine (LEVOPHED) Adult infusion 2 mcg/min (08/26/17 1100)  . potassium PHOSPHATE IVPB (in mmol) 30 mmol (08/26/17 1100)  . 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 Sternum initially left open and closed 08/19/17. Hemodynamics have been fairly stable on current support.  Will decrease NO to 3 ppm anticipating extubation to nasal NO tomorrow.  VT/VF: on IV amio. No VT since yesterday. Keeping K+ and Mg2+ up.   Marked volume overload with anasarca improving rapidly now with large diuresis. Will hold off on further metolazone this evening. Watch CVP closely to avoid over-diuresis.  CXR looks much better today. CCM is weaning vent now and probably plan  extubation tomorrow.  Fever to 100.4 yesterday and 99.3 today. WBC up slightly. Remains on Meropenem. Will remove swan and sleeve today since PICC is in.   Coumadin and Heparin per pharmacy. Therapeutic.   Tube feeds at goal and tolerating well.    I reviewed the LVAD parameters from today, and compared the results to the patient's prior recorded data.  No programming changes were made.  The LVAD is  functioning within specified parameters.   LVAD interrogation was negative for any significant power changes, alarms or PI events/speed drops.  LVAD equipment check completed and is in good working order.  Back-up equipment present.   LVAD education done on emergency procedures and precautions and reviewed exit site care.  Length of Stay: [address]  Fernande Boyden [facility] 08/26/2017, 11:45 AM

## 2017-08-27 ENCOUNTER — Inpatient Hospital Stay (HOSPITAL_COMMUNITY): Payer: PPO

## 2017-08-27 DIAGNOSIS — L8990 Pressure ulcer of unspecified site, unspecified stage: Secondary | ICD-10-CM

## 2017-08-27 LAB — BLOOD GAS, ARTERIAL
Acid-Base Excess: 9.6 mmol/L — ABNORMAL HIGH (ref 0.0–2.0)
BICARBONATE: 33.3 mmol/L — AB (ref 20.0–28.0)
Drawn by: 330991
FIO2: 40
NITRIC OXIDE: 3
O2 Saturation: 97.2 %
PATIENT TEMPERATURE: 97.7
PCO2 ART: 41.5 mmHg (ref 32.0–48.0)
PEEP: 5 cmH2O
PH ART: 7.513 — AB (ref 7.350–7.450)
PO2 ART: 84.3 mmHg (ref 83.0–108.0)
RATE: 16 resp/min
VT: 550 mL

## 2017-08-27 LAB — CBC
HCT: 28.5 % — ABNORMAL LOW (ref 39.0–52.0)
Hemoglobin: 8.9 g/dL — ABNORMAL LOW (ref 13.0–17.0)
MCH: 26 pg (ref 26.0–34.0)
MCHC: 31.2 g/dL (ref 30.0–36.0)
MCV: 83.3 fL (ref 78.0–100.0)
Platelets: 379 10*3/uL (ref 150–400)
RBC: 3.42 MIL/uL — ABNORMAL LOW (ref 4.22–5.81)
RDW: 16.6 % — ABNORMAL HIGH (ref 11.5–15.5)
WBC: 13.7 10*3/uL — ABNORMAL HIGH (ref 4.0–10.5)

## 2017-08-27 LAB — GLUCOSE, CAPILLARY
GLUCOSE-CAPILLARY: 219 mg/dL — AB (ref 70–99)
GLUCOSE-CAPILLARY: 230 mg/dL — AB (ref 70–99)
GLUCOSE-CAPILLARY: 404 mg/dL — AB (ref 70–99)
Glucose-Capillary: 115 mg/dL — ABNORMAL HIGH (ref 70–99)
Glucose-Capillary: 186 mg/dL — ABNORMAL HIGH (ref 70–99)
Glucose-Capillary: 192 mg/dL — ABNORMAL HIGH (ref 70–99)
Glucose-Capillary: 227 mg/dL — ABNORMAL HIGH (ref 70–99)

## 2017-08-27 LAB — COMPREHENSIVE METABOLIC PANEL
ALT: 220 U/L — ABNORMAL HIGH (ref 0–44)
AST: 441 U/L — ABNORMAL HIGH (ref 15–41)
Albumin: 2.2 g/dL — ABNORMAL LOW (ref 3.5–5.0)
Alkaline Phosphatase: 85 U/L (ref 38–126)
Anion gap: 11 (ref 5–15)
BUN: 57 mg/dL — ABNORMAL HIGH (ref 8–23)
CO2: 34 mmol/L — ABNORMAL HIGH (ref 22–32)
Calcium: 9.2 mg/dL (ref 8.9–10.3)
Chloride: 92 mmol/L — ABNORMAL LOW (ref 98–111)
Creatinine, Ser: 1.38 mg/dL — ABNORMAL HIGH (ref 0.61–1.24)
GFR calc Af Amer: 60 mL/min — ABNORMAL LOW (ref >60)
GFR calc non Af Amer: 51 mL/min — ABNORMAL LOW (ref >60)
Glucose, Bld: 188 mg/dL — ABNORMAL HIGH (ref 70–99)
Potassium: 3.5 mmol/L (ref 3.5–5.1)
Sodium: 137 mmol/L (ref 135–145)
Total Bilirubin: 1.6 mg/dL — ABNORMAL HIGH (ref 0.3–1.2)
Total Protein: 7.1 g/dL (ref 6.5–8.1)

## 2017-08-27 LAB — COOXEMETRY PANEL
Carboxyhemoglobin: 1.4 % (ref 0.5–1.5)
Carboxyhemoglobin: 1.5 % (ref 0.5–1.5)
Methemoglobin: 1.5 % (ref 0.0–1.5)
Methemoglobin: 1.4 % (ref 0.0–1.5)
O2 Saturation: 65.6 %
O2 Saturation: 54.6 %
Total hemoglobin: 9.5 g/dL — ABNORMAL LOW (ref 12.0–16.0)
Total hemoglobin: 10.7 g/dL — ABNORMAL LOW (ref 12.0–16.0)

## 2017-08-27 LAB — LACTATE DEHYDROGENASE: LDH: 423 U/L — ABNORMAL HIGH (ref 98–192)

## 2017-08-27 LAB — PHOSPHORUS: Phosphorus: 4.5 mg/dL (ref 2.5–4.6)

## 2017-08-27 LAB — BASIC METABOLIC PANEL
ANION GAP: 12 (ref 5–15)
BUN: 61 mg/dL — ABNORMAL HIGH (ref 8–23)
CHLORIDE: 94 mmol/L — AB (ref 98–111)
CO2: 32 mmol/L (ref 22–32)
Calcium: 9.5 mg/dL (ref 8.9–10.3)
Creatinine, Ser: 1.41 mg/dL — ABNORMAL HIGH (ref 0.61–1.24)
GFR calc non Af Amer: 50 mL/min — ABNORMAL LOW (ref >60)
GFR, EST AFRICAN AMERICAN: 58 mL/min — AB (ref >60)
Glucose, Bld: 234 mg/dL — ABNORMAL HIGH (ref 70–99)
POTASSIUM: 4.5 mmol/L (ref 3.5–5.1)
Sodium: 138 mmol/L (ref 135–145)

## 2017-08-27 LAB — PROTIME-INR
INR: 2.16
Prothrombin Time: 24 seconds — ABNORMAL HIGH (ref 11.4–15.2)

## 2017-08-27 LAB — VANCOMYCIN, TROUGH: VANCOMYCIN TR: 28 ug/mL — AB (ref 15–20)

## 2017-08-27 LAB — CULTURE, RESPIRATORY W GRAM STAIN: Culture: NORMAL

## 2017-08-27 LAB — CULTURE, RESPIRATORY

## 2017-08-27 LAB — PROCALCITONIN: Procalcitonin: 0.4 ng/mL

## 2017-08-27 LAB — MAGNESIUM: Magnesium: 2.2 mg/dL (ref 1.7–2.4)

## 2017-08-27 MED ORDER — VANCOMYCIN HCL 10 G IV SOLR
1250.0000 mg | INTRAVENOUS | Status: AC
  Administered 2017-08-28 – 2017-08-30 (×3): 1250 mg via INTRAVENOUS
  Filled 2017-08-27 (×3): qty 1250

## 2017-08-27 MED ORDER — COLLAGENASE 250 UNIT/GM EX OINT
TOPICAL_OINTMENT | Freq: Every day | CUTANEOUS | Status: DC
  Administered 2017-08-27 – 2017-08-31 (×4): via TOPICAL
  Filled 2017-08-27 (×2): qty 30

## 2017-08-27 MED ORDER — POTASSIUM CHLORIDE 10 MEQ/50ML IV SOLN
10.0000 meq | INTRAVENOUS | Status: AC | PRN
  Administered 2017-08-28 (×2): 10 meq via INTRAVENOUS
  Filled 2017-08-27 (×2): qty 50

## 2017-08-27 MED ORDER — POTASSIUM CHLORIDE 20 MEQ/15ML (10%) PO SOLN
40.0000 meq | ORAL | Status: AC
  Administered 2017-08-27 (×2): 40 meq
  Filled 2017-08-27 (×2): qty 30

## 2017-08-27 MED ORDER — WARFARIN SODIUM 3 MG PO TABS
3.0000 mg | ORAL_TABLET | Freq: Once | ORAL | Status: AC
  Administered 2017-08-27: 3 mg via ORAL
  Filled 2017-08-27: qty 1

## 2017-08-27 MED ORDER — MORPHINE SULFATE (PF) 2 MG/ML IV SOLN
2.0000 mg | INTRAVENOUS | Status: DC | PRN
  Administered 2017-08-28: 2 mg via INTRAVENOUS
  Filled 2017-08-27: qty 1

## 2017-08-27 NOTE — Progress Notes (Signed)
RN paged Dr. Prescott Gum regarding pt's evening coumadin and NPO status.  Pt extubated at ~0900. Verbal orders to give pt ice chips then a sip of water with coumadin with HOB up at 90 degrees.

## 2017-08-27 NOTE — Progress Notes (Signed)
LVAD Coordinator Rounding Not  Admitted 08/11/17 for initiation of Milrinone. 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 He was evaluated [facility] /Dr Posey Pronto on 5/9 for possible heart transplant.   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 Prescott Gum under Destination Therapy criteria. Dr [name] discussed heart transplant candidacy with Dr Posey Pronto. Given size and blood type there was concern he would not make it to transplant.   Pt sitting up in bed, extubated using his IS.  Vital signs: Temp:  97.7 HR: 78 afib Auto BP:  100/88 (93) Doppler: 78 O2 Sat:  97% on 4 L/[place] Wt: 238>246>242>274>267>259>269>261>260>239lbs   LVAD interrogation reveals:  Speed: 5200 Flow: 4.3 Power:  3.6w PI:  2.9 Alarms: none  Events: 2 PI event Hematocrit:  28 Fixed speed: 5200 Low speed limit: 4900  Drive Line: Driveline dressing changed by the wife with nurse observing.  Drive line anchor secure.  Labs:  LDH trend: 274>287>245>240>324>384>425>423  INR trend:  1.71>1.92>1.57>1.37>2.64>2.97>2.54>2.16  Anticoagulation Plan: -INR Goal: 2.0 - 2.5 -ASA Dose: 81 mg daily   Blood Products:  Intra op: - 08/16/17> one platelet; 2 FFP - 08/17/17> ome FFP  Post op: - 08/16/17 > one unit PCs - 08/21/17 > one unit PCs - 08/24/17 > one unit PCs  Device:  - BiV Medtronic - Therapies: off  Respiratory: vented  Nitric Oxide:  4.5 ppm  Gtts: - Epi 2 mcg/min - Milrinone 0.5 mcg/kg/min - Lasix 15 mg/hr - Precedex 1.2 mcg/kg/hr off 08/27/17 - Amiodarone 30 mg/hr - Fentanyl 400 mcg/hr off 08/27/17 - Levo 4 mcg/min off 08/26/17  Adverse Events on VAD: - 08/17/17>mediastinal reexploration with evacuation of mediastinal clot - 08/19/17>sternal closure  VAD education:  1. Caregiver is changing drive line dressing independently. 2. Nursing will begin working with the pt on changing batteries.  Plan/Recommendations:  1. Daily dressing changes per VAD coordinator, nurse champion, or  trained caregiver.  2. Page VAD Coordinator with any VAD equipment or drive line issues.   Tanda Rockers RN, VAD Coordinator 24/7 VAD Pager: [phone]

## 2017-08-27 NOTE — Progress Notes (Signed)
Pharmacy Antibiotic Note  [name] [name] is a 67 y.o. male admitted on 08/11/2017 , with new VAD at risk for PNA.  Pharmacy has been consulted for vancomycin dosing.   Afebrile, wbc 13.7. Vancomycin trough is elevated this evening at 28. Will hold dose tonight and resume at lower note tomorrow. No changes to meropenem  Plan: 1. Restart vancomycin at 1250mg  q24 hours tomorrow 2.Meropenem 1g q8  Height: 5\' 11"  (180.3 cm) Weight: 239 lb 3.2 oz (108.5 kg) IBW/kg (Calculated) : 75.3  Temp (24hrs), Avg:98.3 F (36.8 C), Min:97.7 F (36.5 C), Max:98.8 F (37.1 C)  Recent Labs  Lab 08/23/17 0412  08/24/17 0352  08/25/17 0500  08/26/17 0402 08/26/17 1114 08/26/17 1628 08/26/17 2010 08/27/17 0307 08/27/17 0954 08/27/17 1439  WBC 13.7*  --  14.3*  --  13.0*  --  14.7*  --   --   --  13.7*  --   --   CREATININE 1.05   < > 1.18   < > 1.18   < > 1.31* 1.34* 1.40* 0.98 1.38* 1.41*  --   VANCOTROUGH  --   --   --   --   --   --   --   --   --   --   --   --  28*   < > = values in this interval not displayed.    Estimated Creatinine Clearance: 63.7 mL/min (A) (by C-G formula based on SCr of 1.41 mg/dL (H)).    Allergies  Allergen Reactions  . Penicillins Hives, Itching and Other (See Comments)    Has patient had a PCN reaction causing immediate rash, facial/tongue/throat swelling, SOB or lightheadedness with hypotension:# # Yes # # Has patient had a PCN reaction causing severe rash involving mucus membranes or skin necrosis: No Has patient had a PCN reaction that required hospitalization: No Has patient had a PCN reaction occurring within the last 10 years: No If all of the above answers are "NO", then may proceed with Cephalosporin use.      Thank you for allowing pharmacy to be a part of this patient's care.  Erin Hearing PharmD., BCPS Clinical Pharmacist 08/27/2017 5:09 PM

## 2017-08-27 NOTE — Progress Notes (Signed)
Patient ID: [name] [name], male   DOB: 01-Jul-1950, 67 y.o.   MRN: [id number]     Advanced Heart Failure Rounding Note  PCP-Cardiologist: No primary care provider on file.   Subjective:    Events 6/17 Underwent HM-3 implant -> Unable to close chest with RV failure and high intrathoracic pressure 6/18 Taken back to OR for evacuation of hematoma. NO RVAD needed. Left sternum open  08/19/17 Taken back to OR for sternal closure 6/22 VT => amiodarone started.  6/24 VT/VF=>urgent cardioversion. 6/25 VT  =>urgent cardioversion   Remains on n milrinone 0.5/EPI 2 mcg/NE 2 and lasix 15 mg per hour. NO down to 3 ppm.   Weight down 32 pounds in 5 days. Weaning vent now. Awake and restless.   CVP 11. Co-ox 66%   LVAD Interrogation HM 3: Speed: 5200 Flow: 3.9  PI: 3.6   Power: 5.1  Objective:   Weight Range: 239 lb 3.2 oz (108.5 kg) Body mass index is 33.36 kg/m.   Vital Signs:   Temp:  [97.5 F (36.4 C)-99.3 F (37.4 C)] 97.7 F (36.5 C) (06/28 0500) Pulse Rate:  [25-91] 42 (06/28 0600) Resp:  [14-42] 26 (06/28 0700) BP: (77-119)/(53-89) 94/82 (06/28 0601) SpO2:  [86 %-100 %] 98 % (06/28 0600) Arterial Line BP: (71-102)/(51-75) 101/68 (06/27 1500) FiO2 (%):  [40 %] 40 % (06/28 0735) Weight:  [239 lb 3.2 oz (108.5 kg)] 239 lb 3.2 oz (108.5 kg) (06/28 0600) Last BM Date: 08/22/17  Weight change: Filed Weights   08/25/17 0400 08/26/17 0500 08/27/17 0600  Weight: 260 lb 2.3 oz (118 kg) 249 lb 12.5 oz (113.3 kg) 239 lb 3.2 oz (108.5 kg)    Intake/Output:   Intake/Output Summary (Last 24 hours) at 08/27/2017 0810 Last data filed at 08/27/2017 0700 Gross per 24 hour  Intake 4340.28 ml  Output 9425 ml  Net -5084.72 ml      Physical Exam   General:  Intubated. Awake on vent.  HEENT: normal  +ETT Neck: supple. JVP 11-12 Carotids 2+ bilat; no bruits. No lymphadenopathy or thryomegaly appreciated. Cor: LVAD hum.  Lungs: Clear. Abdomen: obese soft, nontender, non-distended.  No hepatosplenomegaly. No bruits or masses. Good bowel sounds. Driveline site clean. Anchor in place.  Extremities: no cyanosis, clubbing, rash. Warm Trace edema Neuro: awake on vent    Telemetry   A Fib 80-90s Personally reviewed   Labs    CBC Recent Labs    08/26/17 0402 08/27/17 0307  WBC 14.7* 13.7*  HGB 8.8* 8.9*  HCT 27.6* 28.5*  MCV 83.4 83.3  PLT 373 433   Basic Metabolic Panel Recent Labs    08/26/17 0402  08/26/17 2010 08/27/17 0307  NA 137   < > 138 137  K 3.3*   < > 3.7 3.5  CL 95*   < > 93* 92*  CO2 33*   < > 34* 34*  GLUCOSE 150*   < > 157* 188*  BUN 50*   < > 56* 57*  CREATININE 1.31*   < > 0.98 1.38*  CALCIUM 8.6*   < > 9.1 9.2  MG 2.2  --   --  2.2  PHOS  --   --   --  4.5   < > = values in this interval not displayed.   Liver Function Tests Recent Labs    08/26/17 0402 08/27/17 0307  AST 318* 441*  ALT 163* 220*  ALKPHOS 76 85  BILITOT 1.6* 1.6*  PROT 7.2  7.1  ALBUMIN 2.3* 2.2*   No results for input(s): LIPASE, AMYLASE in the last 72 hours. Cardiac Enzymes No results for input(s): CKTOTAL, CKMB, CKMBINDEX, TROPONINI in the last 72 hours.  BNP: BNP (last 3 results) Recent Labs    08/11/17 1438 08/17/17 0314 08/23/17 0024  BNP 529.3* 266.4* 450.5*    ProBNP (last 3 results) Recent Labs    11/12/16 1559  PROBNP 388.0*     D-Dimer No results for input(s): DDIMER in the last 72 hours. Hemoglobin A1C No results for input(s): HGBA1C in the last 72 hours. Fasting Lipid Panel No results for input(s): CHOL, HDL, LDLCALC, TRIG, CHOLHDL, LDLDIRECT in the last 72 hours. Thyroid Function Tests No results for input(s): TSH, T4TOTAL, T3FREE, THYROIDAB in the last 72 hours.  Invalid input(s): FREET3  Other results:   Imaging    Dg Chest Port 1 View  Result Date: 08/26/2017 CLINICAL DATA:  Right PICC line placement EXAM: PORTABLE CHEST 1 VIEW COMPARISON:  August 26, 2017 FINDINGS: There is a new right PICC line. The distal  tip is obscured by other lines but appears to terminate in the right heart, approximately 3.4 cm below the caval atrial junction. The ETT, PA catheter, LVAD, pacemaker, and other support apparatus are stable. Continued effusion and opacity in the left base. Right greater than left opacities have improved. IMPRESSION: 1. The distal tip of the right PICC line appears to be in the right atrium, approximately 3.4 cm above the caval atrial junction. Consider repositioning. 2. Right greater than left opacities have improved, possibly asymmetric edema. 3. Other support apparatus is stable. 4. Probable effusion and opacity in left base, unchanged. Electronically Signed   By: Dorise Bullion III M.D   On: 08/26/2017 11:36     Medications:     Scheduled Medications: . aspirin  81 mg Oral Daily  . bisacodyl  10 mg Oral Daily   Or  . bisacodyl  10 mg Rectal Daily  . chlorhexidine gluconate (MEDLINE KIT)  15 mL Mouth Rinse BID  . Chlorhexidine Gluconate Cloth  6 each Topical Daily  . clonazepam  1 mg Oral BID  . feeding supplement (PRO-STAT SUGAR FREE 64)  30 mL Per Tube BID  . insulin aspart  0-24 Units Subcutaneous Q4H  . insulin detemir  30 Units Subcutaneous BID  . levalbuterol  0.63 mg Nebulization TID  . magnesium oxide  400 mg Oral BID  . mouth rinse  15 mL Mouth Rinse 10 times per day  . metoCLOPramide (REGLAN) injection  10 mg Intravenous Q6H  . potassium chloride  40 mEq Per Tube Q4H  . potassium chloride  60 mEq Oral TID  . QUEtiapine  25 mg Oral BID  . sodium chloride flush  10-40 mL Intracatheter Q12H  . spironolactone  50 mg Oral BID  . Warfarin - Pharmacist Dosing Inpatient   Does not apply q1800    Infusions: . sodium chloride 10 mL/hr at 08/27/17 0700  . amiodarone 30 mg/hr (08/27/17 0700)  . dexmedetomidine (PRECEDEX) IV infusion 0.8 mcg/kg/hr (08/27/17 0700)  . EPINEPHrine 4 mg in dextrose 5% 250 mL infusion (16 mcg/mL) 2 mcg/min (08/27/17 0700)  . famotidine (PEPCID) IV  Stopped (08/27/17 0006)  . furosemide (LASIX) infusion 15 mg/hr (08/27/17 0700)  . lactated ringers Stopped (08/26/17 2336)  . lactated ringers Stopped (08/19/17 1602)  . meropenem (MERREM) IV Stopped (08/27/17 8343)  . milrinone 0.5 mcg/kg/min (08/27/17 0700)  . norepinephrine (LEVOPHED) Adult infusion 2 mcg/min (08/27/17 0700)  .  vancomycin Stopped (08/27/17 0419)    PRN Medications: acetaminophen (TYLENOL) oral liquid 160 mg/5 mL, fentaNYL (SUBLIMAZE) injection, hydrALAZINE, lactated ringers, midazolam, morphine injection, ondansetron (ZOFRAN) IV, oxyCODONE, sodium chloride flush    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   Directly admitted with persistent low cardiac output for milrinone initiation for home.  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 Acute/Chronic systolic CHF with biventricular failure-> cardiogenic shock: Echo  08/13/2017 EF 20-25%. s/p  Medtronic BiV ICD in place. Cath 12/18 with stable 1v CAD.  s/p HM-3 implant 6/17. Unable to close chest due to high-intrathoracic pressures and RV failure. Taken back to OR 08/17/17 with evacuation of hematoma with improvement.   - Remains intubated/sedated. On milrinone 0.5/EPI  2 mcg, NE 2  lasix 15 mg per hour. - Weight down 32 pounds in 5 days. Now below pre-op weight. - CXR improved but not cleared.  - D/w CCM and Dr. PVT will plan to extubate today to NO 3 - Continue lasix gtt one more day. Will stop metolazone. Continue spiro for now to preserve K 2. VAD - s/p HM-3 implant 6/17 - VAD interrogated personally. Parameters stable. - INR 2.16 Discussed dosing with PharmD personally. - Continue asa 81 mg daily.  - LDH 274>324 >384>425> 407> 423 3. Acute respiratory failure post-op - discussion as above. Hope to wean vent today 4. CKD II-III:    -Creatinine stable 1.2-1.3-> 1.38 -Follow 5. [name]/VF: Recurrent VT on 6/22 -S/P VF 6/24 emergent bedside cardioversion.  -S/P VT 6/26 emergent bedside cardioversion.  - No VT over night. Continue amiodarone gtt.  - Keep K > 4.0 Mg > 2.0.  - K 3.5 this am. Given kcl 80 today repeat BMET this afternoon  - Spiro increased to 50 bid yesterday. Supplementation discussed with PharmD 6. AFL - s/p previous ablation. Well-tolerated 7. [name]: Recent cath with stable CAD as above - No change to current plan.   8. [name]: Recent A1c 8.3 on 6/3.  - Cover with SSI in house.  9. Anemia:  - Post-op.  Got 1 unit PRBCs 6/22 and 6/25 . Hgb 8.9 10. Fevers  - fevers resolved. Cx neative - on meropenem  CRITICAL CARE Performed by: Glori Bickers  Total critical care time: 35 minutes  Critical care time was exclusive of separately billable procedures and treating other patients.  Critical care was necessary to treat or prevent imminent or life-threatening deterioration.  Critical care was time spent personally by me (independent of midlevel providers or residents) on the following activities: development of treatment plan with patient and/or surrogate as well as nursing, discussions with consultants, evaluation of patient's response to treatment, examination of patient, obtaining history from patient or surrogate, ordering and performing treatments and interventions, ordering and review of laboratory studies, ordering and review of radiographic studies, pulse oximetry and re-evaluation of patient's condition.    Length of Stay: [place], MD  08/27/2017, 8:10 AM  Advanced Heart Failure Team Pager [phone] (M-F; [place])  Please contact [facility] for night-coverage after hours (4p -7a ) and weekends on [email or web address]

## 2017-08-27 NOTE — Progress Notes (Signed)
HeartMate 3  postop day #11  rounding Note  Subjective:   HeartMate 3 implantation with combined tricuspid valve annuloplasty repair June 17 for mixed ischemic and nonischemic cardiomyopathy, cardiac amyloid disease and preoperative moderate to severe RV dysfunction, preoperative moderate to severe tricuspid regurgitation  Patient had significant RV dysfunction and sternum was left open to maintain adequate hemodynamics.  During the night after surgeryThe patient developed  further signs of right ventricular dysfunction and was taken emergently back for mediastinal reexploration.  A clot was found compressing the right atrium from oozing from the pericardium where adhesions had been taken down at the time of surgery.  The patient returned to the operating room on June 20 for delayed sternal closure.  . Chronic atrial fibrillation. INR therapeutic.  Heparin off  Postop VT now improved for the last several days on amiodarone  Patient extubated today and stood at side of bed.  Chest x-ray significant improved with volume removal and aggressive diuresis.  Swallow study before patient is started on a diet.    LVAD INTERROGATION:  HeartMate II LVAD:  Flow 4 liters/min, speed 5200, power 3.8, PI 2.8.  Controller intact.   Objective:    Vital Signs:   Temp:  [97.7 F (36.5 C)-98.8 F (37.1 C)] 98.1 F (36.7 C) (06/28 1115) Pulse Rate:  [25-113] 113 (06/28 1600) Resp:  [14-48] 25 (06/28 1600) BP: (85-131)/(60-89) 91/70 (06/28 1600) SpO2:  [86 %-100 %] 100 % (06/28 1600) FiO2 (%):  [40 %] 40 % (06/28 0735) Weight:  [239 lb 3.2 oz (108.5 kg)] 239 lb 3.2 oz (108.5 kg) (06/28 0600) Last BM Date: 08/27/17 Mean arterial Pressure 75-80 mmHg  Intake/Output:   Intake/Output Summary (Last 24 hours) at 08/27/2017 1631 Last data filed at 08/27/2017 1500 Gross per 24 hour  Intake 3793.32 ml  Output 10000 ml  Net -6206.68 ml     Physical Exam: General:  Well appearing.  Intubated and  sedated HEENT: normal Neck: supple. JVP . Carotids 2+ bilat; no bruits. No lymphadenopathy or thryomegaly appreciated. Cor: Mechanical heart sounds with LVAD hum present. Lungs: clear Abdomen: soft, nontender, nondistended. No hepatosplenomegaly. No bruits or masses. Good bowel sounds. Extremities: no cyanosis, clubbing, rash, edema Neuro: alert & orientedx3, cranial nerves grossly intact. moves all 4 extremities w/o difficulty. Affect pleasant  Telemetry: V paced at 90/min for underlying A. fib  Labs: Basic Metabolic Panel: Recent Labs  Lab 08/21/17 0335  08/22/17 0406 08/23/17 1914  08/24/17 0352  08/25/17 0500  08/26/17 0402 08/26/17 1114 08/26/17 1628 08/26/17 2010 08/27/17 0307 08/27/17 0954  NA 132*   < > 133* 135   < > 135   < > 136   < > 137 139 135 138 137 138  K 3.7   < > 4.0 3.3*   < > 3.2*   < > 2.9*   < > 3.3* 4.0 4.0 3.7 3.5 4.5  CL 100*   < > 102 98*   < > 98   < > 95*   < > 95* 96* 93* 93* 92* 94*  CO2 23   < > 22 25   < > 27   < > 31   < > 33* 31 29 34* 34* 32  GLUCOSE 238*   < > 232* 218*   < > 175*   < > 199*   < > 150* 188* 254* 157* 188* 234*  BUN 11   < > 18 23*   < > 33*   < >  42*   < > 50* 54* 57* 56* 57* 61*  CREATININE 0.93   < > 0.98 1.05   < > 1.18   < > 1.18   < > 1.31* 1.34* 1.40* 0.98 1.38* 1.41*  CALCIUM 8.2*   < > 8.0* 8.4*   < > 8.4*   < > 8.4*   < > 8.6* 8.9 8.7* 9.1 9.2 9.5  MG  --    < > 2.0 1.8  --  2.1  --  2.0  --  2.2  --   --   --  2.2  --   PHOS 2.7  --  2.4* 2.4*  --   --   --   --   --   --   --   --   --  4.5  --    < > = values in this interval not displayed.    Liver Function Tests: Recent Labs  Lab 08/23/17 0412 08/24/17 0352 08/25/17 0500 08/26/17 0402 08/27/17 0307  AST 110* 223* 342* 318* 441*  ALT 44 93* 150* 163* 220*  ALKPHOS 67 71 77 76 85  BILITOT 1.4* 1.4* 2.0* 1.6* 1.6*  PROT 6.9 6.6 6.8 7.2 7.1  ALBUMIN 2.3* 2.2* 2.3* 2.3* 2.2*   No results for input(s): LIPASE, AMYLASE in the last 168 hours. No results  for input(s): AMMONIA in the last 168 hours.  CBC: Recent Labs  Lab 08/21/17 0335  08/22/17 0406 08/23/17 0412 08/24/17 0352 08/25/17 0500 08/26/17 0402 08/27/17 0307  WBC 7.8   < > 8.8 13.7* 14.3* 13.0* 14.7* 13.7*  NEUTROABS 6.0  --  6.5 11.0*  --   --   --   --   HGB 7.9*   < > 8.4* 8.5* 8.1* 8.4* 8.8* 8.9*  HCT 24.7*   < > 26.4* 26.5* 25.2* 25.9* 27.6* 28.5*  MCV 83.7   < > 84.9 82.6 83.4 83.5 83.4 83.3  PLT 147*   < > 194 261 329 338 373 379   < > = values in this interval not displayed.    INR: Recent Labs  Lab 08/23/17 0412 08/24/17 0352 08/25/17 0500 08/26/17 0402 08/27/17 0307  INR 2.64 2.97 2.54 2.18 2.16    Other results:  EKG:   Imaging: Dg Chest Port 1 View  Result Date: 08/27/2017 CLINICAL DATA:  Status post tricuspid valve replacement.  LVAD. EXAM: PORTABLE CHEST 1 VIEW COMPARISON:  08/26/2017 and prior radiographs FINDINGS: Cardiomegaly and median sternotomy again noted. A LEFT-sided AICD/pacemaker appears unchanged. LVAD device again noted. An endotracheal tube with tip 3 cm above the carina, NG tube entering the stomach with tip off the field of view, RIGHT IJ central venous catheter with tip overlying the SUPERIOR cavoatrial junction, and RIGHT PICC line with tip overlying the SUPERIOR cavoatrial junction again noted. Swan-Ganz catheter has been removed with RIGHT IJ central venous catheter sheath remaining. LEFT LOWER lung consolidation/atelectasis noted. Pulmonary vascular congestion and probable mild interstitial edema IMPRESSION: Swan-Ganz catheter removal without other significant change. No pneumothorax. Electronically Signed   By: Margarette Canada M.D.   On: 08/27/2017 09:18   Dg Chest Port 1 View  Result Date: 08/26/2017 CLINICAL DATA:  Right PICC line placement EXAM: PORTABLE CHEST 1 VIEW COMPARISON:  August 26, 2017 FINDINGS: There is a new right PICC line. The distal tip is obscured by other lines but appears to terminate in the right heart,  approximately 3.4 cm below the caval atrial junction. The ETT, PA  catheter, LVAD, pacemaker, and other support apparatus are stable. Continued effusion and opacity in the left base. Right greater than left opacities have improved. IMPRESSION: 1. The distal tip of the right PICC line appears to be in the right atrium, approximately 3.4 cm above the caval atrial junction. Consider repositioning. 2. Right greater than left opacities have improved, possibly asymmetric edema. 3. Other support apparatus is stable. 4. Probable effusion and opacity in left base, unchanged. Electronically Signed   By: Dorise Bullion III M.D   On: 08/26/2017 11:36   Dg Chest Port 1 View  Result Date: 08/26/2017 CLINICAL DATA:  Follow-up LVAD EXAM: PORTABLE CHEST 1 VIEW COMPARISON:  08/25/2017 FINDINGS: Left ventricular assist device and defibrillator are again noted. Nasogastric catheter and endotracheal tube are again seen. Feeding catheter has been removed in the interval. Gordy Councilman catheter is again noted in the pulmonary artery centrally. Right jugular central line is also noted. Patchy infiltrative changes are again seen bilaterally with some improved aeration in the right upper lobe. Stable vascular congestion is noted. IMPRESSION: Slight improved aeration in the right upper lobe. The remainder of the exam is stable. Electronically Signed   By: Inez Catalina M.D.   On: 08/26/2017 09:03     Medications:     Scheduled Medications: . aspirin  81 mg Oral Daily  . bisacodyl  10 mg Oral Daily   Or  . bisacodyl  10 mg Rectal Daily  . chlorhexidine gluconate (MEDLINE KIT)  15 mL Mouth Rinse BID  . Chlorhexidine Gluconate Cloth  6 each Topical Daily  . clonazepam  1 mg Oral BID  . collagenase   Topical Daily  . insulin aspart  0-24 Units Subcutaneous Q4H  . insulin detemir  30 Units Subcutaneous BID  . levalbuterol  0.63 mg Nebulization TID  . magnesium oxide  400 mg Oral BID  . mouth rinse  15 mL Mouth Rinse 10 times per  day  . metoCLOPramide (REGLAN) injection  10 mg Intravenous Q6H  . QUEtiapine  25 mg Oral BID  . sodium chloride flush  10-40 mL Intracatheter Q12H  . spironolactone  50 mg Oral BID  . warfarin  3 mg Oral ONCE-1800  . Warfarin - Pharmacist Dosing Inpatient   Does not apply q1800    Infusions: . sodium chloride 10 mL/hr at 08/27/17 1500  . amiodarone 30 mg/hr (08/27/17 1500)  . dexmedetomidine (PRECEDEX) IV infusion Stopped (08/27/17 0856)  . EPINEPHrine 4 mg in dextrose 5% 250 mL infusion (16 mcg/mL) 2 mcg/min (08/27/17 1511)  . famotidine (PEPCID) IV Stopped (08/27/17 1022)  . furosemide (LASIX) infusion 15 mg/hr (08/27/17 1604)  . lactated ringers Stopped (08/19/17 1602)  . meropenem (MERREM) IV 1 g (08/27/17 1503)  . milrinone 0.5 mcg/kg/min (08/27/17 1500)  . norepinephrine (LEVOPHED) Adult infusion 2 mcg/min (08/27/17 1500)  . potassium chloride    . [START ON 08/28/2017] vancomycin      PRN Medications: acetaminophen (TYLENOL) oral liquid 160 mg/5 mL, fentaNYL (SUBLIMAZE) injection, hydrALAZINE, lactated ringers, midazolam, morphine injection, ondansetron (ZOFRAN) IV, oxyCODONE, potassium chloride, sodium chloride flush   Assessment:  Mixed ischemic and nonischemic cardiomyopathy with cardiac amyloid disease Preoperative moderate to severe RV dysfunction as well as moderate to severe tricuspid regurgitation Stable after delayed sternal closure  Plan/Discussion:   Appreciate CCM input and intensive care and helping with extubation of patient.  Start postop physical therapy Swallowing assessment dthen  enteral nutrition Finish 3 more days of IV vancomycin-empiric therapy with negative cultures  and temperature curve gradually improving.   I reviewed the LVAD parameters from today, and compared the results to the patient's prior recorded data.  No programming changes were made.  The LVAD is functioning within specified parameters.  The patient performs LVAD self-test daily.   LVAD interrogation was negative for any significant power changes, alarms or PI events/speed drops.  LVAD equipment check completed and is in good working order.  Back-up equipment present.   LVAD education done on emergency procedures and precautions and reviewed exit site care.  Length of Stay: Swain III 08/27/2017, 4:31 PM

## 2017-08-27 NOTE — Progress Notes (Signed)
Pt placed in high fowlers position and given ice chip with no coughing. RN instructed pt to cough and clear throat. No residual water or saliva heard as pt purposefully coughed and talked. A sip of water given to patient and immediately began to choke and cough. RN suctioned pt's mouth and throat while encouraging pt to continue coughing. Coumadin pill and small sip of water given. Pt experienced same reaction. Pt to continue NPO status until Speech Eval can be done. Aggressive pulmonary toileting performed with pt remaining in high fowlers position.

## 2017-08-27 NOTE — Evaluation (Signed)
Occupational Therapy Evaluation Patient Details Name: [name] [name] MRN: [id number] DOB: [birth year] Today's Date: 08/27/2017    History of Present Illness 67 y.o. male admitted 6/12 for milrinone s/p LVAD HM3 placement 6/18 with return to OR same day for hematoma evacuation with delayed closure 2/87, course complicated by cardiogenic shock, biventricular failure, cardioversions, VDRF with extubation 6/28. PMhx: chronic systolic CHF due to ICM, [name], CAD, PAD, [name], HTN, HLD, [name]   Clinical Impression   Pt admitted with above. He demonstrates the below listed deficits and will benefit from continued OT to maximize safety and independence with BADLs.  Pt presents to OT with generalized weakness, impaired balance, decreased activity tolerance, impaired cognition.  He currently requires total A for ADLs.  He was able to sit EOB with mod - max A, and stood with max A +2.  VSS.   PTA, he lived with spouse and was fully independent.  Anticipate he will require CIR level therapies.      Follow Up Recommendations  CIR;Supervision/Assistance - 24 hour    Equipment Recommendations  None recommended by OT    Recommendations for Other Services Rehab consult     Precautions / Restrictions Precautions Precautions: Sternal;Fall Precaution Comments: LVAD, nitric oxide Restrictions Weight Bearing Restrictions: Yes      Mobility Bed Mobility Overal bed mobility: Needs Assistance Bed Mobility: Rolling;Sidelying to Sit;Sit to Sidelying Rolling: Max assist;+2 for physical assistance Sidelying to sit: Max assist;+2 for physical assistance     Sit to sidelying: Max assist;+2 for physical assistance General bed mobility comments: physical assist to bend knees and roll bil for positioning, assist to elevate trunk and bring legs off of and onto bed  Transfers Overall transfer level: Needs assistance Equipment used: 2 person hand held assist Transfers: Sit to/from  Stand Sit to Stand: Max assist;+2 physical assistance         General transfer comment: bil knees blocked with assist of belt and pad for anterior translation and rise. Pt able to tolerate bil LE weight bearing but increased time and difficulty to extend trunk. Pt with weight off sacrum grossly 40 sec    Balance Overall balance assessment: Needs assistance   Sitting balance-Leahy Scale: Poor Sitting balance - Comments: flexed trunk with right posterior lean with min-mod assist for sitting balance with cues and assist to correct grossly 8 min EOb Postural control: Posterior lean;Right lateral lean   Standing balance-Leahy Scale: Zero Standing balance comment: max +2 assist in standing                           ADL either performed or assessed with clinical judgement   ADL Overall ADL's : Needs assistance/impaired Eating/Feeding: NPO   Grooming: Wash/dry hands;Wash/dry face;Oral care;Brushing hair;Total assistance;Sitting   Upper Body Bathing: With caregiver independent assisting;Sitting;Bed level   Lower Body Bathing: Total assistance;Bed level   Upper Body Dressing : Total assistance;Sitting   Lower Body Dressing: Total assistance;Bed level;Sit to/from stand   Toilet Transfer: Total assistance Toilet Transfer Details (indicate cue type and reason): uanble to attempt  Toileting- Clothing Manipulation and Hygiene: Total assistance;Bed level       Functional mobility during ADLs: Maximal assistance;+2 for physical assistance       Vision         Perception     Praxis      Pertinent Vitals/Pain Pain Assessment: Faces Faces Pain Scale: No hurt  Hand Dominance Right   Extremity/Trunk Assessment Upper Extremity Assessment Upper Extremity Assessment: Overall WFL for tasks assessed   Lower Extremity Assessment Lower Extremity Assessment: Defer to PT evaluation   Cervical / Trunk Assessment Cervical / Trunk Assessment: Kyphotic;Other  exceptions(poor trunk control )   Communication Communication Communication: Other (comment)(low volume )   Cognition Arousal/Alertness: Lethargic Behavior During Therapy: Flat affect Overall Cognitive Status: Impaired/Different from baseline Area of Impairment: Orientation;Memory;Attention;Following commands;Safety/judgement;Problem solving                 Orientation Level: Disoriented to;Time;Situation Current Attention Level: Sustained Memory: Decreased recall of precautions;Decreased short-term memory Following Commands: Follows one step commands inconsistently;Follows one step commands with increased time Safety/Judgement: Decreased awareness of safety;Decreased awareness of deficits   Problem Solving: Slow processing;Decreased initiation;Difficulty sequencing;Requires verbal cues;Requires tactile cues General Comments: pt with difficulty maintaining eyes open, stated he was in the hospital for a transplant, unaware of controller, unable to maintain or correct sitting balance with cues   General Comments  VSS throughout session.  RN present.  Began instruction re: safety with controller     Exercises General Exercises - Lower Extremity Long Arc Quad: AROM;5 reps;Seated;Both   Shoulder Instructions      Home Living Family/patient expects to be discharged to:: Private residence Living Arrangements: Spouse/significant other   Type of Home: House                 Bathroom Toilet: Standard     Home Equipment: Shower seat          Prior Functioning/Environment Level of Independence: Independent        Comments: drives, cooks, works as a Research scientist (physical sciences) Problem List: Decreased strength;Decreased activity tolerance;Impaired balance (sitting and/or standing);Decreased cognition;Decreased safety awareness;Decreased knowledge of use of DME or AE;Decreased knowledge of precautions;Cardiopulmonary status limiting activity;Obesity      OT  Treatment/Interventions: Self-care/ADL training;Therapeutic exercise;Energy conservation;DME and/or AE instruction;Cognitive remediation/compensation;Therapeutic activities;Patient/family education;Balance training    OT Goals(Current goals can be found in the care plan section) Acute Rehab OT Goals Patient Stated Goal: lay brick  OT Goal Formulation: With patient/family Time For Goal Achievement: 09/10/17 Potential to Achieve Goals: Good ADL Goals Pt Will Perform Eating: with supervision;with set-up;sitting Pt Will Perform Grooming: with min assist;sitting Pt Will Perform Upper Body Bathing: with mod assist;sitting Pt Will Perform Lower Body Bathing: with max assist;sit to/from stand Pt Will Transfer to Toilet: with mod assist;stand pivot transfer;bedside commode Pt Will Perform Toileting - Clothing Manipulation and hygiene: with max assist;sit to/from stand Pt/caregiver will Perform Home Exercise Program: Increased ROM;Increased strength;Right Upper extremity;Left upper extremity;With theraband;With minimal assist;With written HEP provided Additional ADL Goal #1: Pt will be able to manage LVAD equipment with min A Additional ADL Goal #2: Pt will sit EOB with min guard assist x 15 mins  in prep for ADLs  OT Frequency: Min 3X/week   Barriers to D/C:            Co-evaluation PT/OT/SLP Co-Evaluation/Treatment: Yes Reason for Co-Treatment: Complexity of the patient's impairments (multi-system involvement);For patient/therapist safety;To address functional/ADL transfers PT goals addressed during session: Mobility/safety with mobility;Balance;Strengthening/ROM OT goals addressed during session: Strengthening/ROM      AM-PAC PT "6 Clicks" Daily Activity     Outcome Measure Help from another person eating meals?: Total Help from another person taking care of personal grooming?: Total Help from another person toileting, which includes using toliet, bedpan, or urinal?: Total Help from  another person bathing (including washing, rinsing, drying)?: Total Help from another person to put on and taking off regular upper body clothing?: Total Help from another person to put on and taking off regular lower body clothing?: Total 6 Click Score: 6   End of Session Equipment Utilized During Treatment: Gait belt;Oxygen Nurse Communication: Mobility status  Activity Tolerance: Patient limited by fatigue Patient left: in bed;with call bell/phone within reach;with nursing/sitter in room  OT Visit Diagnosis: Unsteadiness on feet (R26.81);Cognitive communication deficit (R41.841)                Time: 4461-9012 OT Time Calculation (min): 32 min Charges:  OT General Charges $OT Visit: 1 Visit OT Evaluation $OT Eval High Complexity: 1 High G-Codes:     [facility], OTR/L [phone]   Lucille Passy M 08/27/2017, 2:47 PM

## 2017-08-27 NOTE — Procedures (Signed)
Extubation Procedure Note  Patient Details:   Name: [name] [name] DOB: August 09, 1950 MRN: [id number]   Airway Documentation:    Vent end date: 08/27/17 Vent end time: 0900   Evaluation  O2 sats: stable throughout Complications: No apparent complications Patient did tolerate procedure well. Bilateral Breath Sounds: Clear, Diminished   Yes  [name] [name] 08/27/2017, 9:10 AM

## 2017-08-27 NOTE — Progress Notes (Signed)
[place] for coumadin Indication: LVAD  Allergies  Allergen Reactions  . Penicillins Hives, Itching and Other (See Comments)    Has patient had a PCN reaction causing immediate rash, facial/tongue/throat swelling, SOB or lightheadedness with hypotension:# # Yes # # Has patient had a PCN reaction causing severe rash involving mucus membranes or skin necrosis: No Has patient had a PCN reaction that required hospitalization: No Has patient had a PCN reaction occurring within the last 10 years: No If all of the above answers are "NO", then may proceed with Cephalosporin use.     Patient Measurements: Height: '5\' 11"'  (180.3 cm) Weight: 239 lb 3.2 oz (108.5 kg) IBW/kg (Calculated) : 75.3 Heparin Dosing Weight: 98.3 kg  Vital Signs: Temp: 98.1 F (36.7 C) (06/28 1115) Temp Source: Oral (06/28 1115) BP: 131/61 (06/28 1206) Pulse Rate: 101 (06/28 1228)  Labs: Recent Labs    08/25/17 0500  08/26/17 0402  08/26/17 2010 08/27/17 0307 08/27/17 0954  HGB 8.4*  --  8.8*  --   --  8.9*  --   HCT 25.9*  --  27.6*  --   --  28.5*  --   PLT 338  --  373  --   --  379  --   LABPROT 27.1*  --  24.1*  --   --  24.0*  --   INR 2.54  --  2.18  --   --  2.16  --   CREATININE 1.18   < > 1.31*   < > 0.98 1.38* 1.41*   < > = values in this interval not displayed.    Estimated Creatinine Clearance: 63.7 mL/min (A) (by C-G formula based on SCr of 1.41 mg/dL (H)).   Medical History: Past Medical History:  Diagnosis Date  . AICD (automatic cardioverter/defibrillator) present 02/05/2014   Upgrade to Medtronic biventricular ICD, serial number  [id number]   . Atrial flutter ([place]) 04/2012   s/p TEE-EPS+RFCA 04/2012  . CAD (coronary artery disease) 1275,1700 X 2    RCA-T, 70% PL (off CFX), 99% Prox LAD/90% Dist LAD, S/P TAXUS stent x 2  . CHF (congestive heart failure) ([place])   . Chronic anticoagulation   . Chronic systolic heart failure ([place])   . CKD  (chronic kidney disease)   . Diabetic retinopathy ([place])   . DM type 2 (diabetes mellitus, type 2) ([facility])    insulin dependent  . HTN (hypertension)   . Hypercholesteremia    ablation  . ICD (implantable cardiac defibrillator) in place   . Ischemic cardiomyopathy March 2015   20-25% 2D   . Nephrolithiasis   . Ventricular tachycardia ([facility])     Medications:  Scheduled:  . aspirin  81 mg Oral Daily  . bisacodyl  10 mg Oral Daily   Or  . bisacodyl  10 mg Rectal Daily  . chlorhexidine gluconate (MEDLINE KIT)  15 mL Mouth Rinse BID  . Chlorhexidine Gluconate Cloth  6 each Topical Daily  . clonazepam  1 mg Oral BID  . feeding supplement (PRO-STAT SUGAR FREE 64)  30 mL Per Tube BID  . insulin aspart  0-24 Units Subcutaneous Q4H  . insulin detemir  30 Units Subcutaneous BID  . levalbuterol  0.63 mg Nebulization TID  . magnesium oxide  400 mg Oral BID  . mouth rinse  15 mL Mouth Rinse 10 times per day  . metoCLOPramide (REGLAN) injection  10 mg Intravenous Q6H  . potassium chloride  60 mEq Oral TID  . QUEtiapine  25 mg Oral BID  . sodium chloride flush  10-40 mL Intracatheter Q12H  . spironolactone  50 mg Oral BID  . warfarin  3 mg Oral ONCE-1800  . Warfarin - Pharmacist Dosing Inpatient   Does not apply q1800    Assessment: 4 yom who underwent LVAD placement on 6/17. Heparin infusion has been running at 900 units/hr per physician. Underwent sternal closure yesterday on 6/20. Pharmacy resume heparin dosing post-procedure.  Heparin was stopped with INR 2.64 after 3 doses of warfarin. Today INR  2.16. Hgb up slightly to 8.9. No s/sx of bleeding. On concurrent amiodarone IV, which can impact INR sensitivity. Of, note LFTs trending upwards.  Goal of Therapy:  INR goal 2-2.5 Monitor platelets by anticoagulation protocol: Yes   Plan:   Will order warfarin 3 mg again tonight   Monitor daily INR, CBC, and s/sx of bleeding.  Marguerite Olea, [facility] Clinical  Pharmacist Phone [phone]  08/27/2017 12:33 PM

## 2017-08-27 NOTE — Evaluation (Signed)
Physical Therapy Evaluation Patient Details Name: [name] [name] MRN: [id number] DOB: 05-18-1950 Today's Date: 08/27/2017   History of Present Illness  67 y.o. male admitted 6/12 for milrinone s/p LVAD HM3 placement 6/18 with return to OR same day for hematoma evacuation with delayed closure 8/67, course complicated by cardiogenic shock, biventricular failure, cardioversions, VDRF with extubation 6/28. PMhx: chronic systolic CHF due to ICM, [name], CAD, PAD, [name], HTN, HLD, [name]  Clinical Impression  Pt with eyes closed and hands over head on arrival. Pt able to state name, place and wife as well as children's names. Pt with decreased cognition, strength, balance, functional mobility and cardiopulmonary function who will benefit from acute therapy to maximize function, gait and independence to decrease burden of care. Pt educated for LVAD surgery, precautions, mobility and progression. Pt with stable VSS on 4L with nitric oxide throughout.     Follow Up Recommendations CIR;Supervision/Assistance - 24 hour    Equipment Recommendations  Rolling walker with 5" wheels;3in1 (PT)    Recommendations for Other Services       Precautions / Restrictions Precautions Precautions: Sternal;Fall Precaution Comments: LVAD, nitric oxide      Mobility  Bed Mobility Overal bed mobility: Needs Assistance Bed Mobility: Rolling;Sidelying to Sit;Sit to Sidelying Rolling: Max assist;+2 for physical assistance Sidelying to sit: Max assist;+2 for physical assistance     Sit to sidelying: Max assist;+2 for physical assistance General bed mobility comments: physical assist to bend knees and roll bil for positioning, assist to elevate trunk and bring legs off of and onto bed  Transfers Overall transfer level: Needs assistance   Transfers: Sit to/from Stand Sit to Stand: Max assist;+2 physical assistance         General transfer comment: bil knees blocked with assist of  belt and pad for anterior translation and rise. Pt able to tolerate bil LE weight bearing but increased time and difficulty to extend trunk. Pt with weight off sacrum grossly 40 sec  Ambulation/Gait             General Gait Details: unable  Stairs            Wheelchair Mobility    Modified Rankin (Stroke Patients Only)       Balance Overall balance assessment: Needs assistance   Sitting balance-Leahy Scale: Poor Sitting balance - Comments: flexed trunk with right posterior lean with min-mod assist for sitting balance with cues and assist to correct grossly 8 min EOb Postural control: Posterior lean;Right lateral lean   Standing balance-Leahy Scale: Zero Standing balance comment: max +2 assist in standing                             Pertinent Vitals/Pain Pain Assessment: No/denies pain    Home Living Family/patient expects to be discharged to:: Private residence Living Arrangements: Spouse/significant other   Type of Home: House         Home Equipment: Shower seat      Prior Function Level of Independence: Independent         Comments: drives, cooks, works as a Scientist, research (medical) Extremity Assessment Upper Extremity Assessment: Defer to OT evaluation    Lower Extremity Assessment Lower Extremity Assessment: Generalized weakness(grossly 3/5 bil LE pt performed LAQ EOB)    Cervical / Trunk Assessment Cervical / Trunk Assessment:  Kyphotic  Communication   Communication: Other (comment)(soft spoken)  Cognition Arousal/Alertness: Lethargic Behavior During Therapy: Flat affect Overall Cognitive Status: Impaired/Different from baseline Area of Impairment: Orientation;Memory;Attention;Following commands;Safety/judgement;Problem solving                 Orientation Level: Disoriented to;Time;Situation Current Attention Level: Sustained Memory: Decreased recall of  precautions;Decreased short-term memory Following Commands: Follows one step commands inconsistently;Follows one step commands with increased time Safety/Judgement: Decreased awareness of safety;Decreased awareness of deficits   Problem Solving: Slow processing;Decreased initiation;Difficulty sequencing;Requires verbal cues;Requires tactile cues General Comments: pt with difficulty maintaining eyes open, stated he was in the hospital for a transplant, unaware of controller, unable to maintain or correct sitting balance with cues      General Comments      Exercises General Exercises - Lower Extremity Long Arc Quad: AROM;5 reps;Seated;Both   Assessment/Plan    PT Assessment Patient needs continued PT services  PT Problem List Decreased strength;Decreased mobility;Decreased safety awareness;Decreased coordination;Decreased knowledge of precautions;Decreased activity tolerance;Decreased cognition;Cardiopulmonary status limiting activity;Decreased balance;Decreased knowledge of use of DME       PT Treatment Interventions DME instruction;Therapeutic activities;Gait training;Therapeutic exercise;Patient/family education;Balance training;Functional mobility training;Neuromuscular re-education;Cognitive remediation    PT Goals (Current goals can be found in the Care Plan section)  Acute Rehab PT Goals Patient Stated Goal: return to work PT Goal Formulation: With patient/family Time For Goal Achievement: 09/10/17 Potential to Achieve Goals: Fair    Frequency Min 3X/week   Barriers to discharge        Co-evaluation PT/OT/SLP Co-Evaluation/Treatment: Yes Reason for Co-Treatment: Complexity of the patient's impairments (multi-system involvement) PT goals addressed during session: Mobility/safety with mobility;Balance;Strengthening/ROM         AM-PAC PT "6 Clicks" Daily Activity  Outcome Measure Difficulty turning over in bed (including adjusting bedclothes, sheets and blankets)?:  Unable Difficulty moving from lying on back to sitting on the side of the bed? : Unable Difficulty sitting down on and standing up from a chair with arms (e.g., wheelchair, bedside commode, etc,.)?: Unable Help needed moving to and from a bed to chair (including a wheelchair)?: Total Help needed walking in hospital room?: Total Help needed climbing 3-5 steps with a railing? : Total 6 Click Score: 6    End of Session Equipment Utilized During Treatment: Gait belt;Oxygen Activity Tolerance: Patient tolerated treatment well Patient left: in bed;with call bell/phone within reach;with nursing/sitter in room Nurse Communication: Mobility status;Need for lift equipment;Precautions PT Visit Diagnosis: Unsteadiness on feet (R26.81);Other abnormalities of gait and mobility (R26.89);Muscle weakness (generalized) (M62.81)    Time: 6606-3016 PT Time Calculation (min) (ACUTE ONLY): 32 min   Charges:   PT Evaluation $PT Eval High Complexity: 1 High     PT G Codes:        Elwyn Reach, PT [phone]   Hitomi Slape B Jenel Gierke 08/27/2017, 1:12 PM

## 2017-08-27 NOTE — Consult Note (Addendum)
[facility] Nurse wound consult note Assessment completed at [facility] Reason for Consult: buttock wounds Wound type: Pressure injuries Pressure Injury POA: No Measurements: Left buttock has an area of darkened tissue that is consistent with an area that was a bulla (blister).  Currently there is no fluid in the area.  It measures 2.5 cm x 2.8 cm.  It is consistent with a DTI.  The surrounding area does not display erythema, induration, bogginess, or drainage.  There is no odor.  The right buttock has an area that measure 8.5 cm x 6 cm x unknown depth. This is an unstageable wound due to the slough.   I understand from my conversation with a primary care RN, that this area was a fluid filled bulla.  Today the overlaying skin is missing and the wound bed is approximately 90% darkened, 5% yellow, 5% pink.  Surrounding tissue is darkened and fragile.  There is no bogginess, induration, drainage, or odor. Dressing procedure/placement/frequency: Apply Santyl to the right buttock wound in a nickel thick layer. ACTIVATE Santyl by covering with saline moistened gauze, then dry gauze or ABD pad.  Change daily.  Continue with a foam dressing to the darkened area on the left buttock.  Turn the patient every 2 hours. Monitor the wound area(s) for worsening of condition such as: Signs/symptoms of infection,  Increase in size,  Development of or worsening of odor, Development of pain, or increased pain at the affected locations.  Notify the medical team if any of these develop.  Thank you for the consult.  Discussed plan of care with the patient and bedside nurse.   Val Riles, RN, MSN, CWOCN, CNS-BC, pager [phone]

## 2017-08-27 NOTE — Progress Notes (Addendum)
Report given by previous RN that pt is NPO including water due to cough and choke.   Pt had PO meds scheduled at 2200.... Crushed meds and put them in apple sauce.  Pt tolerated well and no wet sounds heard.... Will continue to monitor.

## 2017-08-27 NOTE — Progress Notes (Addendum)
PULMONARY / CRITICAL CARE MEDICINE   Name: [name] [name] MRN: [id number] DOB: 1950/11/06    ADMISSION DATE:  08/11/2017 CONSULTATION DATE:  08/25/2017  REFERRING MD:  Dr. [name]  CHIEF COMPLAINT:  Difficultly weaning from vent, vent management  HISTORY OF PRESENT ILLNESS:   [name] [name] is a 67 y.o. Male with a PMH of Ischemic cardiomyopathy, chronic systolic HF, CAD, s/p AICD, HTN, CKD, and DM II who was admitted to [facility] 08/11/17 for initiation of Milrinone due to persistent low cardiac output.  Upon admission, he was noted to have marked volume overload, therefore IV diuresis initiated.  On 08/17/17 he underwent implantation of HeartMate 3 LVAD.  His hospital course has been complicated by cardiogenic shock in setting of Acute on Chronic Systolic CHF with biventricluar failure, Ventilator dependent respiratory failure, and episodes of V-tach/V-fib requiring cardioversion.   PCCM is consulted 08/25/17 to assist with ventilator management and ventilator weaning.  SUBJECTIVE:  No events overnight, no new complaints Weight down 32 pounds on 5 days>> below pre-op weight Tolerating CPAP/PS Will plan to extubate 6/28 on 3 of nitric CVP 11. Co ox  66% LVAD Interrogation HM 3: Speed: 5200 Flow: 3.9  PI: 3.6   Power: 5.1 T Max 99.3    VITAL SIGNS: BP 100/88 (BP Location: Left Arm)   Pulse 78   Temp 97.7 F (36.5 C) (Oral)   Resp (!) 27   Ht 5\' 11"  (1.803 m)   Wt 239 lb 3.2 oz (108.5 kg)   SpO2 98%   BMI 33.36 kg/m   HEMODYNAMICS: PAP: (35-56)/(14-28) 46/22 CVP:  [4 mmHg-21 mmHg] 4 mmHg  VENTILATOR SETTINGS: Vent Mode: PSV;CPAP FiO2 (%):  [40 %] 40 % Set Rate:  [16 bmp] 16 bmp Vt Set:  [550 mL] 550 mL PEEP:  [5 cmH20] 5 cmH20 Pressure Support:  [10 cmH20] 10 cmH20 Plateau Pressure:  [15 cmH20] 15 cmH20  INTAKE / OUTPUT: I/O last 3 completed shifts: In: 7110.7 [I.V.:3413.2; NG/GT:1955; IV Piggyback:1742.5] Out: 10258 [NIDPO:24235]  PHYSICAL EXAMINATION: General:   Acutely ill appearing male, NAD, awake and following commands Neuro:  [place], moving all ext to command, nodding appropriately HEENT:  [place]/AT, PERRL, EOM-I and MMM, ETT secure  Cardiovascular:  IRIR, Nl S1/S2 and -M/R/G, LVAD HUM Lungs:  Coarse BS diffusely, diminished per bases Abdomen:  Obese, soft, NT, ND and +BS Musculoskeletal:  2+ edema and -tenderness Skin:  Dry, warm. No cyanosis.  LABS:  BMET Recent Labs  Lab 08/26/17 1628 08/26/17 2010 08/27/17 0307  NA 135 138 137  K 4.0 3.7 3.5  CL 93* 93* 92*  CO2 29 34* 34*  BUN 57* 56* 57*  CREATININE 1.40* 0.98 1.38*  GLUCOSE 254* 157* 188*   Electrolytes Recent Labs  Lab 08/22/17 0406 08/23/17 0412  08/25/17 0500  08/26/17 0402  08/26/17 1628 08/26/17 2010 08/27/17 0307  CALCIUM 8.0* 8.4*   < > 8.4*   < > 8.6*   < > 8.7* 9.1 9.2  MG 2.0 1.8   < > 2.0  --  2.2  --   --   --  2.2  PHOS 2.4* 2.4*  --   --   --   --   --   --   --  4.5   < > = values in this interval not displayed.   CBC Recent Labs  Lab 08/25/17 0500 08/26/17 0402 08/27/17 0307  WBC 13.0* 14.7* 13.7*  HGB 8.4* 8.8* 8.9*  HCT 25.9* 27.6* 28.5*  PLT 338 373 379   Coag's Recent Labs  Lab 08/25/17 0500 08/26/17 0402 08/27/17 0307  INR 2.54 2.18 2.16   Sepsis Markers Recent Labs  Lab 08/25/17 1310 08/26/17 0402 08/27/17 0307  PROCALCITON 0.53 0.45 0.40   ABG Recent Labs  Lab 08/24/17 0411 08/25/17 0620 08/27/17 0545  PHART 7.468* 7.465* 7.513*  PCO2ART 39.5 44.8 41.5  PO2ART 68.4* 75.7* 84.3   Liver Enzymes Recent Labs  Lab 08/25/17 0500 08/26/17 0402 08/27/17 0307  AST 342* 318* 441*  ALT 150* 163* 220*  ALKPHOS 77 76 85  BILITOT 2.0* 1.6* 1.6*  ALBUMIN 2.3* 2.3* 2.2*    Cardiac Enzymes No results for input(s): TROPONINI, PROBNP in the last 168 hours.  Glucose Recent Labs  Lab 08/26/17 0818 08/26/17 1202 08/26/17 1544 08/26/17 2042 08/26/17 2356 08/27/17 0313  GLUCAP 146* 184* 246* 143* 192* 186*     Imaging Dg Chest Port 1 View  Result Date: 08/26/2017 CLINICAL DATA:  Right PICC line placement EXAM: PORTABLE CHEST 1 VIEW COMPARISON:  August 26, 2017 FINDINGS: There is a new right PICC line. The distal tip is obscured by other lines but appears to terminate in the right heart, approximately 3.4 cm below the caval atrial junction. The ETT, PA catheter, LVAD, pacemaker, and other support apparatus are stable. Continued effusion and opacity in the left base. Right greater than left opacities have improved. IMPRESSION: 1. The distal tip of the right PICC line appears to be in the right atrium, approximately 3.4 cm above the caval atrial junction. Consider repositioning. 2. Right greater than left opacities have improved, possibly asymmetric edema. 3. Other support apparatus is stable. 4. Probable effusion and opacity in left base, unchanged. Electronically Signed   By: Dorise Bullion III M.D   On: 08/26/2017 11:36     STUDIES: ECHO 4/81>> Systolic function was severely reduced. The estimated ejection fraction was in the range of 20% to 25%. Akinesis of the apicalanteroseptal, inferoseptal, and apical myocardium. Dyskinesis of the inferior myocardium. The right atrium was mildly dilated. PA peak pressure:48 mm Hg  Cardiac cath 08/13/17>> Mildly elevated filling pressures and normalized cardiac output on milrinone 0.375  CULTURES: Surgical/wound 6/17>> No Growth Sputum 6/22>> negative Blood culture 6/25>> Sputum 6/25>> GS with few Gram + cocci, rare gram variable rod>>  ANTIBIOTICS: Levofloxacin 6/18>>6/25 Meropenem 6/25>> Vancomycin 6/25>>  SIGNIFICANT EVENTS: 6/17>>Underwent HM-3 implant>>unable to close chest with RV failure and high intrathoracic pressure 6/18>> Take back to OR for evacuation of hematoma. No RVAD needed. Sternum left open 6/20>> Taken back to OR for sternal closure 6/22>> VT, amiodarone infusion started 6/24>> VT/VF, underwent urgent cardioversion 6/25>> VT,  underwent urgent cardioversion 6/26>> PCCM consulted for vent management/weaning 6/28>> Will attempt extubation on 3 of nitric  LINES/TUBES: R IJ CVC 6/17>> ETT 6/17>>  DISCUSSION: 67 y.o,.with Cardiogenic shock in setting of Acute on Chronic Systolic CHF with biventricluar failure, s/p LVAD placement, volume overload and Ventilator dependent respiratory failure, and episodes of V-tach/V-fib requiring cardioversion.     ASSESSMENT / PLAN:  PULMONARY A: Acute hypoxic respiratory failure in setting of Acute on Chronic Systolic CHF with Biventricular failure ? RUL PNA (VAP) P:   Aggressive pulmonary toilet Titrate oxygen for sats > 92% IS  Q 1 While awake  Flutter Valve Q 2 While awake Follow Cultures as above Abx as above Trend PCT CXR in am 6/29   CARDIOVASCULAR A:  Acute on Chronic systolic CHF with Biventricular Failure Cardiogenic shock Intermittent episodes of V-tach/Vfib Hx:  CAD s/p PCI of RCA & LAD -Echo 08/13/17>> EF 20-25% -s/p Medtronic BiV ICD in place -s/p LVAD (HM-3) implant 6/17 PAF P:  Heart Failure team & Cardiothoracic Surgery following, appreciate input Continue Milrinone, Epinephrine, Levophed per Cardiology Maintain MAP >65 Continue Amiodarone gtt IV diuresis with Lasix gtt 15 mg/hr>> good diuresis  RENAL A:   CKD II-III Metabolic alkalosis, likely in setting of aggressive diuresis Hypokalemia>> repleted 6/28 Creatinine continues to trend down P:   Monitor I&O's / urine output Trend BMP>> Continue Q 6 BMETs Ensure adequate renal perfusion Avoid nephrotoxic agents as able Diamox and zaroxolyn added to lasix drip Replete electrolytes prn  GASTROINTESTINAL A:   Hypoalbuminemia P:   NPO Swallow eval post extubation Tube feeds, rate adjustment per dietician Trend CMET prn  HEMATOLOGIC A:   Anemia P:  Monitor for s/sx of bleeding Trend CBC S/p 1 unit PRBCs 6/25 Transfuse for Hgb<7  INFECTIOUS A:   Leukocytosis>> down  trending Fever ? RUL PNA (VAP) P:   Monitor fever curve Trend WBCs Trend  PCT Follow cultures Merrem and vanc Follow CXR  ENDOCRINE A:   Hyperglycemia Hx: DM II P:   CBG's Q 4 SSI Continue Levemir  NEUROLOGIC A:   Sedation needs for mechanical ventilation Precedex weaned to 0.2 P:   RASS goal: 0 to -1 Daily WUA Add seroquel and clonazepam PO to wean and D/C precedex Fentanyl to pushes prn Provide supportive care Lights on during the day/ off at night  FAMILY  - Updates: Wife updated at  bedside 6/28 by both myself and Dr. Nelda Marseille  - Inter-disciplinary family meet or Palliative Care meeting due by:  09/01/2017   Extubated to 4L with nitric of 3. Cough is a  Weak, but improved with encouragement.. Aggressive IS and pulmonary toilet   [name] [name] Pulmonary/Critical Care Medicine. Pager: [phone]. 08/27/2017 8:37 AM   Attending Note:  67 year old male with an extensive cardiac history who presents to PCCM in respiratory failure and cardiogenic shock.  On exam, lungs with diffuse crackles but improving.  I reviewed CXR myself, ETT is in good position with some residual pulmonary edema.  Discussed with PCCM-NP.  Patient is weaning well, will proceed with extubation today.  Continue lasix for one more day.  Hold other diuretics.  Replace electrolytes.  PCCM will continue to follow.  The patient is critically ill with multiple organ systems failure and requires high complexity decision making for assessment and support, frequent evaluation and titration of therapies, application of advanced monitoring technologies and extensive interpretation of multiple databases.   Critical Care Time devoted to patient care services described in this note is  35  Minutes. This time reflects time of care of this signee Dr Jennet Maduro. This critical care time does not reflect procedure time, or teaching time or supervisory time of PA/NP/Med student/Med Resident etc but could  involve care discussion time.  Rush Farmer, M.D. [facility] Pulmonary/Critical Care Medicine. Pager: [phone]. After hours pager: [phone].

## 2017-08-28 ENCOUNTER — Inpatient Hospital Stay (HOSPITAL_COMMUNITY): Payer: PPO

## 2017-08-28 LAB — COOXEMETRY PANEL
Carboxyhemoglobin: 1.9 % — ABNORMAL HIGH (ref 0.5–1.5)
Methemoglobin: 1 % (ref 0.0–1.5)
O2 Saturation: 53.6 %
Total hemoglobin: 9.6 g/dL — ABNORMAL LOW (ref 12.0–16.0)

## 2017-08-28 LAB — COMPREHENSIVE METABOLIC PANEL
ALT: 189 U/L — ABNORMAL HIGH (ref 0–44)
ALT: 177 U/L — AB (ref 0–44)
ANION GAP: 11 (ref 5–15)
ANION GAP: 13 (ref 5–15)
AST: 320 U/L — ABNORMAL HIGH (ref 15–41)
AST: 253 U/L — ABNORMAL HIGH (ref 15–41)
Albumin: 2.3 g/dL — ABNORMAL LOW (ref 3.5–5.0)
Albumin: 2.3 g/dL — ABNORMAL LOW (ref 3.5–5.0)
Alkaline Phosphatase: 86 U/L (ref 38–126)
Alkaline Phosphatase: 92 U/L (ref 38–126)
BUN: 60 mg/dL — AB (ref 8–23)
BUN: 59 mg/dL — ABNORMAL HIGH (ref 8–23)
CALCIUM: 9.6 mg/dL (ref 8.9–10.3)
CHLORIDE: 89 mmol/L — AB (ref 98–111)
CO2: 32 mmol/L (ref 22–32)
CO2: 32 mmol/L (ref 22–32)
CREATININE: 1.47 mg/dL — AB (ref 0.61–1.24)
Calcium: 9 mg/dL (ref 8.9–10.3)
Chloride: 93 mmol/L — ABNORMAL LOW (ref 98–111)
Creatinine, Ser: 1.48 mg/dL — ABNORMAL HIGH (ref 0.61–1.24)
GFR, EST AFRICAN AMERICAN: 55 mL/min — AB (ref >60)
GFR, EST AFRICAN AMERICAN: 55 mL/min — AB (ref >60)
GFR, EST NON AFRICAN AMERICAN: 47 mL/min — AB (ref >60)
GFR, EST NON AFRICAN AMERICAN: 48 mL/min — AB (ref >60)
Glucose, Bld: 169 mg/dL — ABNORMAL HIGH (ref 70–99)
Glucose, Bld: 57 mg/dL — ABNORMAL LOW (ref 70–99)
Potassium: 3.3 mmol/L — ABNORMAL LOW (ref 3.5–5.1)
Sodium: 132 mmol/L — ABNORMAL LOW (ref 135–145)
Sodium: 138 mmol/L (ref 135–145)
TOTAL PROTEIN: 7.3 g/dL (ref 6.5–8.1)
Total Bilirubin: 3.7 mg/dL — ABNORMAL HIGH (ref 0.3–1.2)
Total Bilirubin: 2 mg/dL — ABNORMAL HIGH (ref 0.3–1.2)
Total Protein: 7.3 g/dL (ref 6.5–8.1)

## 2017-08-28 LAB — CBC
HCT: 30.4 % — ABNORMAL LOW (ref 39.0–52.0)
Hemoglobin: 9.4 g/dL — ABNORMAL LOW (ref 13.0–17.0)
MCH: 26.7 pg (ref 26.0–34.0)
MCHC: 30.9 g/dL (ref 30.0–36.0)
MCV: 86.4 fL (ref 78.0–100.0)
PLATELETS: 411 10*3/uL — AB (ref 150–400)
RBC: 3.52 MIL/uL — ABNORMAL LOW (ref 4.22–5.81)
RDW: 19.7 % — AB (ref 11.5–15.5)
WBC: 15.9 10*3/uL — ABNORMAL HIGH (ref 4.0–10.5)

## 2017-08-28 LAB — PROTIME-INR
INR: 2.63
Prothrombin Time: 27.9 seconds — ABNORMAL HIGH (ref 11.4–15.2)

## 2017-08-28 LAB — PHOSPHORUS: PHOSPHORUS: 4 mg/dL (ref 2.5–4.6)

## 2017-08-28 LAB — GLUCOSE, CAPILLARY
GLUCOSE-CAPILLARY: 168 mg/dL — AB (ref 70–99)
GLUCOSE-CAPILLARY: 203 mg/dL — AB (ref 70–99)
GLUCOSE-CAPILLARY: 327 mg/dL — AB (ref 70–99)
GLUCOSE-CAPILLARY: 84 mg/dL (ref 70–99)
GLUCOSE-CAPILLARY: 86 mg/dL (ref 70–99)
Glucose-Capillary: 173 mg/dL — ABNORMAL HIGH (ref 70–99)
Glucose-Capillary: 170 mg/dL — ABNORMAL HIGH (ref 70–99)

## 2017-08-28 LAB — MAGNESIUM: Magnesium: 2.4 mg/dL (ref 1.7–2.4)

## 2017-08-28 LAB — PROCALCITONIN: PROCALCITONIN: 0.35 ng/mL

## 2017-08-28 LAB — LACTATE DEHYDROGENASE: LDH: 397 U/L — ABNORMAL HIGH (ref 98–192)

## 2017-08-28 MED ORDER — WARFARIN SODIUM 1 MG PO TABS
1.0000 mg | ORAL_TABLET | Freq: Once | ORAL | Status: AC
Start: 2017-08-28 — End: 2017-08-28
  Administered 2017-08-28: 1 mg via ORAL
  Filled 2017-08-28: qty 1

## 2017-08-28 MED ORDER — ORAL CARE MOUTH RINSE
15.0000 mL | Freq: Two times a day (BID) | OROMUCOSAL | Status: DC
  Administered 2017-08-28 – 2017-09-10 (×17): 15 mL via OROMUCOSAL

## 2017-08-28 MED ORDER — SPIRONOLACTONE 25 MG PO TABS
25.0000 mg | ORAL_TABLET | Freq: Two times a day (BID) | ORAL | Status: DC
  Administered 2017-08-28 – 2017-08-31 (×7): 25 mg via ORAL
  Filled 2017-08-28 (×8): qty 1

## 2017-08-28 MED ORDER — DEXTROSE 50 % IV SOLN
INTRAVENOUS | Status: AC
  Administered 2017-08-28: 25 mL
  Filled 2017-08-28: qty 50

## 2017-08-28 MED ORDER — POTASSIUM CHLORIDE 20 MEQ PO PACK
40.0000 meq | PACK | Freq: Two times a day (BID) | ORAL | Status: DC
  Administered 2017-08-28 – 2017-08-30 (×5): 40 meq via ORAL
  Filled 2017-08-28 (×5): qty 2

## 2017-08-28 NOTE — Progress Notes (Signed)
Nitric is now turned off and patient seems to be tolerating 5L of oxygen well.

## 2017-08-28 NOTE — Progress Notes (Signed)
[place] for coumadin Indication: LVAD  Allergies  Allergen Reactions  . Penicillins Hives, Itching and Other (See Comments)    Has patient had a PCN reaction causing immediate rash, facial/tongue/throat swelling, SOB or lightheadedness with hypotension:# # Yes # # Has patient had a PCN reaction causing severe rash involving mucus membranes or skin necrosis: No Has patient had a PCN reaction that required hospitalization: No Has patient had a PCN reaction occurring within the last 10 years: No If all of the above answers are "NO", then may proceed with Cephalosporin use.     Patient Measurements: Height: 5\' 11"  (180.3 cm) Weight: 227 lb 8.2 oz (103.2 kg) IBW/kg (Calculated) : 75.3 Heparin Dosing Weight: 98.3 kg  Vital Signs: Temp: 96.3 F (35.7 C) (06/29 1500) Temp Source: Bladder (06/29 0800) BP: 95/52 (06/29 1500) Pulse Rate: 91 (06/29 1500)  Labs: Recent Labs    08/26/17 0402  08/27/17 0307 08/27/17 0954 08/28/17 0359 08/28/17 0740  HGB 8.8*  --  8.9*  --  9.4*  --   HCT 27.6*  --  28.5*  --  30.4*  --   PLT 373  --  379  --  411*  --   LABPROT 24.1*  --  24.0*  --  27.9*  --   INR 2.18  --  2.16  --  2.63  --   CREATININE 1.31*   < > 1.38* 1.41* 1.48* 1.47*   < > = values in this interval not displayed.    Estimated Creatinine Clearance: 59.7 mL/min (A) (by C-G formula based on SCr of 1.47 mg/dL (H)).   Medical History: Past Medical History:  Diagnosis Date  . AICD (automatic cardioverter/defibrillator) present 02/05/2014   Upgrade to Medtronic biventricular ICD, serial number  [id number]   . Atrial flutter ([place]) 04/2012   s/p TEE-EPS+RFCA 04/2012  . CAD (coronary artery disease) 3086,5784 X 2    RCA-T, 70% PL (off CFX), 99% Prox LAD/90% Dist LAD, S/P TAXUS stent x 2  . CHF (congestive heart failure) ([place])   . Chronic anticoagulation   . Chronic systolic heart failure ([place])   . CKD (chronic kidney disease)   .  Diabetic retinopathy ([place])   . DM type 2 (diabetes mellitus, type 2) (HCC)    insulin dependent  . HTN (hypertension)   . Hypercholesteremia    ablation  . ICD (implantable cardiac defibrillator) in place   . Ischemic cardiomyopathy March 2015   20-25% 2D   . Nephrolithiasis   . Ventricular tachycardia (H[facility])     Medications:  Scheduled:  . aspirin  81 mg Oral Daily  . bisacodyl  10 mg Oral Daily   Or  . bisacodyl  10 mg Rectal Daily  . Chlorhexidine Gluconate Cloth  6 each Topical Daily  . clonazepam  1 mg Oral BID  . collagenase   Topical Daily  . insulin aspart  0-24 Units Subcutaneous Q4H  . insulin detemir  30 Units Subcutaneous BID  . levalbuterol  0.63 mg Nebulization TID  . magnesium oxide  400 mg Oral BID  . mouth rinse  15 mL Mouth Rinse BID  . metoCLOPramide (REGLAN) injection  10 mg Intravenous Q6H  . potassium chloride  40 mEq Oral BID  . QUEtiapine  25 mg Oral BID  . sodium chloride flush  10-40 mL Intracatheter Q12H  . spironolactone  25 mg Oral BID  . warfarin  1 mg Oral ONCE-1800  . Warfarin -  Pharmacist Dosing Inpatient   Does not apply q1800    Assessment: 39 yom who underwent LVAD placement on 6/17  S/p Heparin drip stopped with INR 2.64 after 3 doses of warfarin. Today INR  2.6 h/h stable pltc stable No s/sx of bleeding. On concurrent amiodarone IV, which can impact INR sensitivity.  LFTs/Tbili elevated but  trending down.  LDH 300-400  Goal of Therapy:  INR goal 2-2.5 Monitor platelets by anticoagulation protocol: Yes   Plan:   Warfarin 1mg  x1  Monitor s/s bleeding  Bonnita Nasuti Pharm.D. CPP, BCPS Clinical Pharmacist [phone] 08/28/2017 3:51 PM

## 2017-08-28 NOTE — Progress Notes (Signed)
Rehab Admissions Coordinator Note:  Patient was screened by Cleatrice Burke for appropriateness for an Inpatient Acute Rehab Consult per PT and OT recommendation.  At this time, we are recommending Inpatient Rehab consult if pt would like to be considered for admit. Please advise.  Cleatrice Burke 08/28/2017, 9:28 AM  I can be reached at [phone].

## 2017-08-28 NOTE — Progress Notes (Addendum)
Patient ID: [name] [name], male   DOB: Oct 05, 1950, 67 y.o.   MRN: [id number]     Advanced Heart Failure Rounding Note  PCP-Cardiologist: No primary care provider on file.   Subjective:    Events 6/17 Underwent HM-3 implant -> Unable to close chest with RV failure and high intrathoracic pressure 6/18 Taken back to OR for evacuation of hematoma. NO RVAD needed. Left sternum open  08/19/17 Taken back to OR for sternal closure 6/22 VT => amiodarone started.  6/24 VT/VF=>urgent cardioversion. 6/25 VT  =>urgent cardioversion   Off vent. Now on [place] 5L. On NO at 3ppm. EPI 2 NE 3 (being adjusted for BP) and milrinone 0.5. Co-ox 54%  Remains on lasix gtt at 10. CVP 6-7. Respiratory status improved. Has been getting klonopin and seroquel for agitation. Had several BMs this am.   LVAD Interrogation HM 3: Speed: 5200 Flow: 4.3  PI: 3.1  Power: 4.0 .VAD interrogated personally. Parameters stable.  Objective:   Weight Range: 103.2 kg (227 lb 8.2 oz) Body mass index is 31.73 kg/m.   Vital Signs:   Temp:  [95 F (35 C)-97.8 F (36.6 C)] 95.9 F (35.5 C) (06/29 1400) Pulse Rate:  [29-129] 89 (06/29 1400) Resp:  [17-33] 20 (06/29 1400) BP: (43-98)/(35-86) 91/59 (06/29 1400) SpO2:  [79 %-100 %] 97 % (06/29 1447) Weight:  [103.2 kg (227 lb 8.2 oz)] 103.2 kg (227 lb 8.2 oz) (06/29 0449) Last BM Date: 08/27/17  Weight change: Filed Weights   08/26/17 0500 08/27/17 0600 08/28/17 0449  Weight: 113.3 kg (249 lb 12.5 oz) 108.5 kg (239 lb 3.2 oz) 103.2 kg (227 lb 8.2 oz)    Intake/Output:   Intake/Output Summary (Last 24 hours) at 08/28/2017 1513 Last data filed at 08/28/2017 1414 Gross per 24 hour  Intake 2086.92 ml  Output 4765 ml  Net -2678.08 ml      Physical Exam   MAPs 60-70s General:  NAD.  HEENT: normal  Neck: supple. JVP not elevated.  Carotids 2+ bilat; no bruits. No lymphadenopathy or thryomegaly appreciated. Cor: LVAD hum. Sternal dressing ok Lungs: Clear. Abdomen:  obese soft, nontender, non-distended. No hepatosplenomegaly. No bruits or masses. Good bowel sounds. Driveline site clean. Anchor in place.  Extremities: no cyanosis, clubbing, rash. Warm no edema  Neuro: alert & oriented x 3. No focal deficits. Moves all 4 without problem    Telemetry   A Fib 80-90s with PVCs. Personally reviewed   Labs    CBC Recent Labs    08/27/17 0307 08/28/17 0359  WBC 13.7* 15.9*  HGB 8.9* 9.4*  HCT 28.5* 30.4*  MCV 83.3 86.4  PLT 379 767*   Basic Metabolic Panel Recent Labs    08/27/17 0307  08/28/17 0359 08/28/17 0740  NA 137   < > 132* 138  K 3.5   < > NOT DONE 3.3*  CL 92*   < > 89* 93*  CO2 34*   < > 32 32  GLUCOSE 188*   < > 169* 57*  BUN 57*   < > 60* 59*  CREATININE 1.38*   < > 1.48* 1.47*  CALCIUM 9.2   < > 9.0 9.6  MG 2.2  --  2.4  --   PHOS 4.5  --  4.0  --    < > = values in this interval not displayed.   Liver Function Tests Recent Labs    08/28/17 0359 08/28/17 0740  AST 320* 253*  ALT 189* 177*  ALKPHOS 86 92  BILITOT 3.7* 2.0*  PROT 7.3 7.3  ALBUMIN 2.3* 2.3*   No results for input(s): LIPASE, AMYLASE in the last 72 hours. Cardiac Enzymes No results for input(s): CKTOTAL, CKMB, CKMBINDEX, TROPONINI in the last 72 hours.  BNP: BNP (last 3 results) Recent Labs    08/11/17 1438 08/17/17 0314 08/23/17 0024  BNP 529.3* 266.4* 450.5*    ProBNP (last 3 results) Recent Labs    11/12/16 1559  PROBNP 388.0*     D-Dimer No results for input(s): DDIMER in the last 72 hours. Hemoglobin A1C No results for input(s): HGBA1C in the last 72 hours. Fasting Lipid Panel No results for input(s): CHOL, HDL, LDLCALC, TRIG, CHOLHDL, LDLDIRECT in the last 72 hours. Thyroid Function Tests No results for input(s): TSH, T4TOTAL, T3FREE, THYROIDAB in the last 72 hours.  Invalid input(s): FREET3  Other results:   Imaging    Dg Chest Port 1 View  Result Date: 08/28/2017 CLINICAL DATA:  Respiratory failure EXAM:  PORTABLE CHEST 1 VIEW COMPARISON:  Yesterday FINDINGS: Increased airspace opacity on the right more than left. Low volumes with probable left pleural effusion. There is a right PICC with tip at the upper cavoatrial junction. Right IJ line with tip at the SVC. Biventricular pacer and AICD. No pneumothorax. Stable cardiopericardial enlargement. Status post extubation. A right IJ sheath has been removed. IMPRESSION: 1. Increased airspace opacity favoring edema. There has been interval extubation and atelectasis may contribute. 2. Remaining hardware in stable position. Electronically Signed   By: Monte Fantasia M.D.   On: 08/28/2017 07:45     Medications:     Scheduled Medications: . aspirin  81 mg Oral Daily  . bisacodyl  10 mg Oral Daily   Or  . bisacodyl  10 mg Rectal Daily  . Chlorhexidine Gluconate Cloth  6 each Topical Daily  . clonazepam  1 mg Oral BID  . collagenase   Topical Daily  . insulin aspart  0-24 Units Subcutaneous Q4H  . insulin detemir  30 Units Subcutaneous BID  . levalbuterol  0.63 mg Nebulization TID  . magnesium oxide  400 mg Oral BID  . mouth rinse  15 mL Mouth Rinse BID  . metoCLOPramide (REGLAN) injection  10 mg Intravenous Q6H  . QUEtiapine  25 mg Oral BID  . sodium chloride flush  10-40 mL Intracatheter Q12H  . spironolactone  50 mg Oral BID  . Warfarin - Pharmacist Dosing Inpatient   Does not apply q1800    Infusions: . sodium chloride 10 mL/hr at 08/28/17 1412  . amiodarone 30 mg/hr (08/28/17 1400)  . dexmedetomidine (PRECEDEX) IV infusion Stopped (08/27/17 0856)  . EPINEPHrine 4 mg in dextrose 5% 250 mL infusion (16 mcg/mL) 2 mcg/min (08/28/17 1400)  . famotidine (PEPCID) IV 20 mg (08/28/17 0939)  . furosemide (LASIX) infusion 15 mg/hr (08/28/17 1400)  . lactated ringers Stopped (08/19/17 1602)  . meropenem (MERREM) IV 1 g (08/28/17 1414)  . milrinone 0.5 mcg/kg/min (08/28/17 1400)  . norepinephrine (LEVOPHED) Adult infusion 2 mcg/min (08/28/17 1100)    . vancomycin 1,250 mg (08/28/17 0552)    PRN Medications: acetaminophen (TYLENOL) oral liquid 160 mg/5 mL, fentaNYL (SUBLIMAZE) injection, hydrALAZINE, lactated ringers, midazolam, morphine injection, ondansetron (ZOFRAN) IV, oxyCODONE, sodium chloride flush    [name], [name], [name], [name],  [name], [name], HTN, HL, [name]   Directly admitted with persistent low cardiac output for milrinone initiation for home.  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 Acute/Chronic systolic CHF with biventricular failure-> cardiogenic shock: Echo  08/13/2017 EF 20-25%. s/p  Medtronic BiV ICD in place. Cath 12/18 with stable 1v CAD.  s/p HM-3 implant 6/17. Unable to close chest due to high-intrathoracic pressures and RV failure. Taken back to OR 08/17/17 with evacuation of hematoma with improvement.   -  Extubated 6/28  - On milrinone 0.5/EPI  2 mcg, NE 3  lasix 15 mg per hour. Co-ox marginal at 54% Adjust NE as needed for MAP 70-90 - Weight down 47 pounds in the past week. Now well below pre-op weight. CVP 6-7. Will stop lasix gtt - Wean NO off.  - CXR with increased ATX - needs flutter valve and IS - Need to mobilize 2. VAD - s/p HM-3 implant 6/17  - VAD interrogated personally. Parameters stable. - INR 2.6 Discussed dosing with PharmD personally. - Continue asa 81 mg daily.  - LDH 274>324 >384>425> 407> 423> 397 3. Acute respiratory failure post-op - discussion as above. Hope to wean vent today 4. CKD II-III:  -Creatinine stable 1.4 5. [name]/VF: Recurrent VT on 6/22 -S/P VF 6/24 emergent bedside cardioversion.  -S/P VT 6/26 emergent bedside cardioversion.  - No VT over night. Continue amiodarone gtt.  - Keep K > 4.0 Mg > 2.0.  - K 3.3 this am. Will supp  - Spiro increased to 50 bid yesterday. Will decrease to 25 bid 6. AFL - s/p  previous ablation. Well-tolerated 7. [name]: Recent cath with stable CAD as above - No change to current plan.   8. [name]: Recent A1c 8.3 on 6/3.  - Cover with SSI in house.  9. Anemia:  - Post-op.  Got 1 unit PRBCs 6/22 and 6/25 . Hgb stable 9.4 10. Fevers  - fevers resolved. Cx negative - on meropenem will continue for now  11. Deconditioning - severe. CIR consult placed.   CRITICAL CARE Performed by: Glori Bickers  Total critical care time: 35 minutes  Critical care time was exclusive of separately billable procedures and treating other patients.  Critical care was necessary to treat or prevent imminent or life-threatening deterioration.  Critical care was time spent personally by me (independent of midlevel providers or residents) on the following activities: development of treatment plan with patient and/or surrogate as well as nursing, discussions with consultants, evaluation of patient's response to treatment, examination of patient, obtaining history from patient or surrogate, ordering and performing treatments and interventions, ordering and review of laboratory studies, ordering and review of radiographic studies, pulse oximetry and re-evaluation of patient's condition.    Length of Stay: 17  Glori Bickers, MD  08/28/2017, 3:13 PM  Advanced Heart Failure Team Pager [phone] (M-F; 7a - 4p)  Please contact [facility] for night-coverage after hours (4p -7a ) and weekends on [email or web address]

## 2017-08-28 NOTE — Evaluation (Signed)
Clinical/Bedside Swallow Evaluation Patient Details  Name: [name] [name] MRN: [id number] Date of Birth: 04-14-1950  Today's Date: 08/28/2017 Time: SLP Start Time (ACUTE ONLY): 0809 SLP Stop Time (ACUTE ONLY): 0828 SLP Time Calculation (min) (ACUTE ONLY): 19 min  Past Medical History:  Past Medical History:  Diagnosis Date  . AICD (automatic cardioverter/defibrillator) present 02/05/2014   Upgrade to Medtronic biventricular ICD, serial number  [id number]   . Atrial flutter ([place]) 04/2012   s/p TEE-EPS+RFCA 04/2012  . CAD (coronary artery disease) 8299,3716 X 2    RCA-T, 70% PL (off CFX), 99% Prox LAD/90% Dist LAD, S/P TAXUS stent x 2  . CHF (congestive heart failure) ([place])   . Chronic anticoagulation   . Chronic systolic heart failure ([place])   . CKD (chronic kidney disease)   . Diabetic retinopathy ([place])   . DM type 2 (diabetes mellitus, type 2) ([facility])    insulin dependent  . HTN (hypertension)   . Hypercholesteremia    ablation  . ICD (implantable cardiac defibrillator) in place   . Ischemic cardiomyopathy March 2015   20-25% 2D   . Nephrolithiasis   . Ventricular tachycardia [facility])    Past Surgical History:  Past Surgical History:  Procedure Laterality Date  . ATRIAL FLUTTER ABLATION N/A 05/19/2012   Procedure: ATRIAL FLUTTER ABLATION;  Surgeon: Thompson Grayer, MD;  Location: [facility] CATH LAB;  Service: Cardiovascular;  Laterality: N/A;  . BI-VENTRICULAR IMPLANTABLE CARDIOVERTER DEFIBRILLATOR UPGRADE N/A 02/05/2014   Procedure: BI-VENTRICULAR IMPLANTABLE CARDIOVERTER DEFIBRILLATOR UPGRADE;  Surgeon: Evans Lance, MD;  Location: [facility] CATH LAB;  Service: Cardiovascular;  Laterality: N/A;  . BIV ICD GENERTAOR CHANGE OUT  02/05/2014   Upgrade to Medtronic biventricular ICD, serial number  [id number] by Dr. [name]  . CARDIAC DEFIBRILLATOR PLACEMENT  2007    Medtronic Maximo VR, serial number [id number]  . INSERTION OF IMPLANTABLE LEFT VENTRICULAR ASSIST DEVICE N/A 08/16/2017   Procedure: INSERTION OF IMPLANTABLE LEFT VENTRICULAR ASSIST DEVICE-HM3;  Surgeon: Ivin Poot, MD;  Location: [place];  Service: Open Heart Surgery;  Laterality: N/A;  . IR FLUORO GUIDE CV LINE RIGHT  08/12/2017  . IR US GUIDE VASC ACCESS RIGHT  08/12/2017  . MEDIASTINAL EXPLORATION  08/17/2017   Procedure: MEDIASTINAL REXPLORATION with evacuation of hematoma;  Surgeon: Prescott Gum, Collier Salina, MD;  Location[facility] OR;  Service: Open Heart Surgery;;  . PERCUTANEOUS CORONARY STENT INTERVENTION (PCI-S)  January 2002   PTCA/Stent Distal RCA  . PERCUTANEOUS CORONARY STENT INTERVENTION (PCI-S)  June 2002   PTCA/Stent x 3 RCA, thrombolysis - failed  . PERCUTANEOUS CORONARY STENT INTERVENTION (PCI-S)  July 2006   TAXUS stents to prox and distal LAD  . RIGHT HEART CATH N/A 07/22/2017   Procedure: RIGHT HEART CATH;  Surgeon: Jolaine Artist, MD;  Location: [place] CV LAB;  Service: Cardiovascular;  Laterality: N/A;  . RIGHT HEART CATH N/A 08/13/2017   Procedure: RIGHT HEART CATH - swan;  Surgeon: Jolaine Artist, MD;  Location: [place] CV LAB;  Service: Cardiovascular;  Laterality: N/A;  . RIGHT/LEFT HEART CATH AND CORONARY ANGIOGRAPHY N/A 02/18/2017   Procedure: RIGHT/LEFT HEART CATH AND CORONARY ANGIOGRAPHY;  Surgeon: Jolaine Artist, MD;  Location: [place] CV LAB;  Service: Cardiovascular;  Laterality: N/A;  . STERNAL CLOSURE N/A 08/19/2017   Procedure: STERNAL CLOSURE;  Surgeon: Ivin Poot, MD;  Location: [place];  Service: Thoracic;  Laterality: N/A;  . TEE WITHOUT CARDIOVERSION N/A 08/16/2017   Procedure: TRANSESOPHAGEAL ECHOCARDIOGRAM (TEE);  Surgeon: Prescott Gum, Collier Salina, MD;  Location: [place];  Service: Open Heart Surgery;  Laterality: N/A;  . TEE WITHOUT CARDIOVERSION N/A 08/19/2017   Procedure: TRANSESOPHAGEAL ECHOCARDIOGRAM (TEE);  Surgeon: Prescott Gum, Collier Salina, MD;  Location: [place];  Service: Thoracic;  Laterality: N/A;  . TRICUSPID VALVE REPLACEMENT N/A 08/16/2017   Procedure:  TRICUSPID VALVE REPAIR using Oletta Lamas MC3 Ring size 30;  Surgeon: Ivin Poot, MD;  Location: [place];  Service: Open Heart Surgery;  Laterality: N/A;   HPI:  67 y.o. 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    Assessment / Plan / Recommendation Clinical Impression   Pt exhibits reversible oropharyngeal dysphagia likely d/t prolonged intubation characterized by multiple swallows with cup sips of thin liquids and immediate cough with larger bolus of thin liquid via straw; these symptoms were eliminated with 1/2 tsp amounts of thin liquids; he appeared to consume puree without dysphagia symptoms and soft solid consumption yielded impaired mastication and prolonged oral transit with lingual residue observed; recommend initiating a conservative diet of Dysphagia 1 (puree)/thin liquids via 1/2 tsp only to assist with probable pharyngeal weakness; pt exhibited a low vocal intensity and weak cough as well which increases his aspiration risk to moderate; ST will f/u for diet tolerance and education re: swallowing/aspiration precautions and potential objective testing prn. SLP Visit Diagnosis: Dysphagia, oropharyngeal phase (R13.12)    Aspiration Risk  Moderate aspiration risk    Diet Recommendation     Medication Administration: Crushed with puree    Other  Recommendations Oral Care Recommendations: Oral care BID Other Recommendations: Remove water pitcher   Follow up Recommendations Other (comment)(TBD)      Frequency and Duration min 2x/week  1 week       Prognosis Prognosis for Safe Diet Advancement: Good      Swallow Study   General Date of Onset: 08/16/17 HPI: 67 y.o. 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  Type of  Study: Bedside Swallow Evaluation Previous Swallow Assessment: 08/27/17 nursing tried water with pt and he coughed/choked per note Diet Prior to this Study: NPO Temperature Spikes Noted: No Respiratory Status: Nasal cannula History of Recent Intubation: Yes Length of Intubations (days): 11 days Date extubated: 08/27/17 Behavior/Cognition: Alert;Cooperative Oral Cavity Assessment: Within Functional Limits Oral Care Completed by SLP: Recent completion by staff Oral Cavity - Dentition: Edentulous Vision: Functional for self-feeding Self-Feeding Abilities: Needs assist Patient Positioning: Upright in bed Baseline Vocal Quality: Low vocal intensity Volitional Cough: Weak Volitional Swallow: Able to elicit    Oral/Motor/Sensory Function Overall Oral Motor/Sensory Function: Within functional limits   Ice Chips Ice chips: Within functional limits Presentation: Spoon   Thin Liquid Thin Liquid: Impaired Presentation: Cup;Straw;Spoon Pharyngeal  Phase Impairments: Multiple swallows;Cough - Immediate Other Comments: (with larger boluses/multiple swallows/immediate cough)    Nectar Thick Nectar Thick Liquid: Not tested   Honey Thick Honey Thick Liquid: Not tested   Puree Puree: Within functional limits Presentation: Spoon   Solid      Solid: Impaired Presentation: Spoon Oral Phase Functional Implications: Prolonged oral transit;Oral residue;Impaired mastication        Elvina Sidle, M.S., CCC-SLP 08/28/2017,8:42 AM

## 2017-08-29 ENCOUNTER — Inpatient Hospital Stay (HOSPITAL_COMMUNITY): Payer: PPO

## 2017-08-29 DIAGNOSIS — N183 Chronic kidney disease, stage 3 unspecified: Secondary | ICD-10-CM

## 2017-08-29 DIAGNOSIS — D62 Acute posthemorrhagic anemia: Secondary | ICD-10-CM

## 2017-08-29 DIAGNOSIS — I4892 Unspecified atrial flutter: Secondary | ICD-10-CM

## 2017-08-29 DIAGNOSIS — I5043 Acute on chronic combined systolic (congestive) and diastolic (congestive) heart failure: Secondary | ICD-10-CM

## 2017-08-29 DIAGNOSIS — Z794 Long term (current) use of insulin: Secondary | ICD-10-CM

## 2017-08-29 DIAGNOSIS — E11319 Type 2 diabetes mellitus with unspecified diabetic retinopathy without macular edema: Secondary | ICD-10-CM

## 2017-08-29 DIAGNOSIS — J9600 Acute respiratory failure, unspecified whether with hypoxia or hypercapnia: Secondary | ICD-10-CM

## 2017-08-29 DIAGNOSIS — R0682 Tachypnea, not elsewhere classified: Secondary | ICD-10-CM

## 2017-08-29 DIAGNOSIS — I2510 Atherosclerotic heart disease of native coronary artery without angina pectoris: Secondary | ICD-10-CM

## 2017-08-29 DIAGNOSIS — I10 Essential (primary) hypertension: Secondary | ICD-10-CM

## 2017-08-29 DIAGNOSIS — I509 Heart failure, unspecified: Secondary | ICD-10-CM

## 2017-08-29 DIAGNOSIS — I255 Ischemic cardiomyopathy: Secondary | ICD-10-CM

## 2017-08-29 DIAGNOSIS — D72829 Elevated white blood cell count, unspecified: Secondary | ICD-10-CM

## 2017-08-29 DIAGNOSIS — R570 Cardiogenic shock: Secondary | ICD-10-CM

## 2017-08-29 LAB — GLUCOSE, CAPILLARY
GLUCOSE-CAPILLARY: 136 mg/dL — AB (ref 70–99)
GLUCOSE-CAPILLARY: 141 mg/dL — AB (ref 70–99)
GLUCOSE-CAPILLARY: 202 mg/dL — AB (ref 70–99)
GLUCOSE-CAPILLARY: 158 mg/dL — AB (ref 70–99)
Glucose-Capillary: 125 mg/dL — ABNORMAL HIGH (ref 70–99)
Glucose-Capillary: 157 mg/dL — ABNORMAL HIGH (ref 70–99)

## 2017-08-29 LAB — COMPREHENSIVE METABOLIC PANEL
ALT: 130 U/L — ABNORMAL HIGH (ref 0–44)
ANION GAP: 13 (ref 5–15)
AST: 133 U/L — ABNORMAL HIGH (ref 15–41)
Albumin: 2.3 g/dL — ABNORMAL LOW (ref 3.5–5.0)
Alkaline Phosphatase: 83 U/L (ref 38–126)
BUN: 58 mg/dL — ABNORMAL HIGH (ref 8–23)
CO2: 30 mmol/L (ref 22–32)
Calcium: 9.2 mg/dL (ref 8.9–10.3)
Chloride: 94 mmol/L — ABNORMAL LOW (ref 98–111)
Creatinine, Ser: 1.67 mg/dL — ABNORMAL HIGH (ref 0.61–1.24)
GFR, EST AFRICAN AMERICAN: 47 mL/min — AB (ref >60)
GFR, EST NON AFRICAN AMERICAN: 41 mL/min — AB (ref >60)
Glucose, Bld: 163 mg/dL — ABNORMAL HIGH (ref 70–99)
POTASSIUM: 4.1 mmol/L (ref 3.5–5.1)
Sodium: 137 mmol/L (ref 135–145)
Total Bilirubin: 1.8 mg/dL — ABNORMAL HIGH (ref 0.3–1.2)
Total Protein: 7.2 g/dL (ref 6.5–8.1)

## 2017-08-29 LAB — CULTURE, BLOOD (ROUTINE X 2)
Culture: NO GROWTH
Culture: NO GROWTH
SPECIAL REQUESTS: ADEQUATE
SPECIAL REQUESTS: ADEQUATE

## 2017-08-29 LAB — PROTIME-INR
INR: 2.87
Prothrombin Time: 29.8 seconds — ABNORMAL HIGH (ref 11.4–15.2)

## 2017-08-29 LAB — COOXEMETRY PANEL
Carboxyhemoglobin: 1.4 % (ref 0.5–1.5)
Methemoglobin: 1.6 % — ABNORMAL HIGH (ref 0.0–1.5)
O2 Saturation: 63.1 %
Total hemoglobin: 9.6 g/dL — ABNORMAL LOW (ref 12.0–16.0)

## 2017-08-29 LAB — MAGNESIUM: Magnesium: 2.6 mg/dL — ABNORMAL HIGH (ref 1.7–2.4)

## 2017-08-29 LAB — LACTATE DEHYDROGENASE: LDH: 269 U/L — ABNORMAL HIGH (ref 98–192)

## 2017-08-29 MED ORDER — WARFARIN SODIUM 1 MG PO TABS
1.0000 mg | ORAL_TABLET | Freq: Once | ORAL | Status: AC
  Administered 2017-08-29: 1 mg via ORAL
  Filled 2017-08-29: qty 1

## 2017-08-29 MED ORDER — AMIODARONE HCL 200 MG PO TABS
200.0000 mg | ORAL_TABLET | Freq: Two times a day (BID) | ORAL | Status: DC
  Administered 2017-08-29 – 2017-09-04 (×14): 200 mg via ORAL
  Filled 2017-08-29 (×14): qty 1

## 2017-08-29 MED ORDER — RESOURCE THICKENUP CLEAR PO POWD
ORAL | Status: DC | PRN
  Filled 2017-08-29 (×4): qty 125

## 2017-08-29 MED ORDER — MILRINONE LACTATE IN DEXTROSE 20-5 MG/100ML-% IV SOLN
0.1250 ug/kg/min | INTRAVENOUS | Status: DC
  Administered 2017-08-29 – 2017-08-31 (×6): 0.375 ug/kg/min via INTRAVENOUS
  Administered 2017-08-31 – 2017-09-05 (×10): 0.25 ug/kg/min via INTRAVENOUS
  Administered 2017-09-06: 0.125 ug/kg/min via INTRAVENOUS
  Administered 2017-09-06: 0.25 ug/kg/min via INTRAVENOUS
  Administered 2017-09-07: 0.125 ug/kg/min via INTRAVENOUS
  Filled 2017-08-29 (×18): qty 100

## 2017-08-29 NOTE — Progress Notes (Signed)
Patient ID: [name] [name], male   DOB: 1950/04/21, 67 y.o.   MRN: [id number]     Advanced Heart Failure Rounding Note  PCP-Cardiologist: No primary care provider on file.   Subjective:    Events 6/17 Underwent HM-3 implant -> Unable to close chest with RV failure and high intrathoracic pressure 6/18 Taken back to OR for evacuation of hematoma. NO RVAD needed. Left sternum open  08/19/17 Taken back to OR for sternal closure 6/22 VT => amiodarone started.  6/24 VT/VF=>urgent cardioversion. 6/25 VT  =>urgent cardioversion   On milrinone 0.5. NE 2 Epi 2. Co-ox 63% All tubes out. Weak. Denies CP or SOB. Failed swallow study. CVP 7   LVAD Interrogation HM 3: Speed: 5200 Flow: 4.8  PI: 3.1  Power: 3.7 .VAD interrogated personally. Parameters stable.  Objective:   Weight Range: 102.1 kg (225 lb 1.4 oz) Body mass index is 31.39 kg/m.   Vital Signs:   Temp:  [95.7 F (35.4 C)-98.1 F (36.7 C)] 97.7 F (36.5 C) (06/30 1200) Pulse Rate:  [46-104] 46 (06/30 1200) Resp:  [13-36] 23 (06/30 1200) BP: (78-254)/(33-232) 102/75 (06/30 1200) SpO2:  [86 %-100 %] 99 % (06/30 1200) Weight:  [102.1 kg (225 lb 1.4 oz)] 102.1 kg (225 lb 1.4 oz) (06/30 0426) Last BM Date: 08/28/17  Weight change: Filed Weights   08/27/17 0600 08/28/17 0449 08/29/17 0426  Weight: 108.5 kg (239 lb 3.2 oz) 103.2 kg (227 lb 8.2 oz) 102.1 kg (225 lb 1.4 oz)    Intake/Output:   Intake/Output Summary (Last 24 hours) at 08/29/2017 1231 Last data filed at 08/29/2017 1100 Gross per 24 hour  Intake 3335.83 ml  Output 2159 ml  Net 1176.83 ml      Physical Exam   MAPs 70-80s General:  NAD.  HEENT: normal  Neck: supple. JVP 7  Carotids 2+ bilat; no bruits. No lymphadenopathy or thryomegaly appreciated. Cor: LVAD hum. Sternal wound ok Lungs: Clear. Abdomen: obese soft, nontender, non-distended. No hepatosplenomegaly. No bruits or masses. Good bowel sounds. Driveline site clean. Anchor in place.  Extremities:  no cyanosis, clubbing, rash. Warm no edema  Neuro: alert & oriented x 3. No focal deficits. Moves all 4 without problem   Telemetry   A Fib 80s with PVCs. Personally reviewed   Labs    CBC Recent Labs    08/27/17 0307 08/28/17 0359  WBC 13.7* 15.9*  HGB 8.9* 9.4*  HCT 28.5* 30.4*  MCV 83.3 86.4  PLT 379 470*   Basic Metabolic Panel Recent Labs    08/27/17 0307  08/28/17 0359 08/28/17 0740 08/29/17 0400  NA 137   < > 132* 138 137  K 3.5   < > NOT DONE 3.3* 4.1  CL 92*   < > 89* 93* 94*  CO2 34*   < > 32 32 30  GLUCOSE 188*   < > 169* 57* 163*  BUN 57*   < > 60* 59* 58*  CREATININE 1.38*   < > 1.48* 1.47* 1.67*  CALCIUM 9.2   < > 9.0 9.6 9.2  MG 2.2  --  2.4  --  2.6*  PHOS 4.5  --  4.0  --   --    < > = values in this interval not displayed.   Liver Function Tests Recent Labs    08/28/17 0740 08/29/17 0400  AST 253* 133*  ALT 177* 130*  ALKPHOS 92 83  BILITOT 2.0* 1.8*  PROT 7.3 7.2  ALBUMIN 2.3*  2.3*   No results for input(s): LIPASE, AMYLASE in the last 72 hours. Cardiac Enzymes No results for input(s): CKTOTAL, CKMB, CKMBINDEX, TROPONINI in the last 72 hours.  BNP: BNP (last 3 results) Recent Labs    08/11/17 1438 08/17/17 0314 08/23/17 0024  BNP 529.3* 266.4* 450.5*    ProBNP (last 3 results) Recent Labs    11/12/16 1559  PROBNP 388.0*     D-Dimer No results for input(s): DDIMER in the last 72 hours. Hemoglobin A1C No results for input(s): HGBA1C in the last 72 hours. Fasting Lipid Panel No results for input(s): CHOL, HDL, LDLCALC, TRIG, CHOLHDL, LDLDIRECT in the last 72 hours. Thyroid Function Tests No results for input(s): TSH, T4TOTAL, T3FREE, THYROIDAB in the last 72 hours.  Invalid input(s): FREET3  Other results:   Imaging    Dg Chest Port 1 View  Result Date: 08/29/2017 CLINICAL DATA:  LVAD present EXAM: PORTABLE CHEST - 1 VIEW COMPARISON:  the previous day's study FINDINGS: Stable left subclavian AICD, right IJ  central venous catheter, right arm PICC, and LVAD. Slight improvement in the patchy interstitial airspace opacities throughout both lungs. Left retrocardiac consolidation/atelectasis persists. Previous median sternotomy and CABG. Heart size upper limits normal for technique. Probable left pleural effusion. No pneumothorax. IMPRESSION: 1. Partial improvement in bilateral infiltrates or edema. 2.  Support hardware stable in position. 3. Probable left pleural effusion. Electronically Signed   By: Lucrezia Europe M.D.   On: 08/29/2017 08:48     Medications:     Scheduled Medications: . aspirin  81 mg Oral Daily  . bisacodyl  10 mg Oral Daily   Or  . bisacodyl  10 mg Rectal Daily  . Chlorhexidine Gluconate Cloth  6 each Topical Daily  . clonazepam  1 mg Oral BID  . collagenase   Topical Daily  . insulin aspart  0-24 Units Subcutaneous Q4H  . insulin detemir  30 Units Subcutaneous BID  . levalbuterol  0.63 mg Nebulization TID  . magnesium oxide  400 mg Oral BID  . mouth rinse  15 mL Mouth Rinse BID  . metoCLOPramide (REGLAN) injection  10 mg Intravenous Q6H  . potassium chloride  40 mEq Oral BID  . QUEtiapine  25 mg Oral BID  . sodium chloride flush  10-40 mL Intracatheter Q12H  . spironolactone  25 mg Oral BID  . Warfarin - Pharmacist Dosing Inpatient   Does not apply q1800    Infusions: . sodium chloride 10 mL/hr at 08/28/17 1412  . amiodarone 30 mg/hr (08/29/17 1100)  . EPINEPHrine 4 mg in dextrose 5% 250 mL infusion (16 mcg/mL) 2 mcg/min (08/29/17 1100)  . famotidine (PEPCID) IV 20 mg (08/29/17 1020)  . lactated ringers Stopped (08/19/17 1602)  . meropenem (MERREM) IV 1 g (08/29/17 0930)  . milrinone 0.5 mcg/kg/min (08/29/17 1100)  . norepinephrine (LEVOPHED) Adult infusion 2 mcg/min (08/29/17 1100)  . vancomycin Stopped (08/29/17 0645)    PRN Medications: acetaminophen (TYLENOL) oral liquid 160 mg/5 mL, fentaNYL (SUBLIMAZE) injection, hydrALAZINE, lactated ringers, midazolam,  morphine injection, ondansetron (ZOFRAN) IV, oxyCODONE, sodium chloride flush    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   Directly admitted with persistent low cardiac output for milrinone initiation for home.  S/p HM-3 on 6/17   Assessment/Plan  1. Acute/Chronic systolic CHF with biventricular failure-> cardiogenic shock: Echo  08/13/2017 EF 20-25%. s/p  [employer]tronic BiV ICD in place. Cath 12/18 with stable 1v CAD.  s/p HM-3 implant 6/17. Unable to close chest due to high-intrathoracic pressures and RV failure. Taken back to OR 08/17/17 with evacuation of hematoma with improvement.   -  Extubated 6/28  - On milrinone 0.5, EPI  2 mcg, NE 2 MAPs OK.  Decrease milrinone to 0.375. Wean NE as tolerated - Weight down 49 pounds in the past week. Now well below pre-op weight. CVP 7. Off lasix - CXR improving - continue flutter valve and IS - Need to mobilize 2. VAD - s/p HM-3 implant 6/17  - VAD interrogated personally. Parameters stable. - INR 2.9 Discussed dosing with PharmD personally. - Continue asa 81 mg daily.  - LDH 274>324 >384>425> 407> 423> 397> 269 3. Acute respiratory failure post-op - discussion as above. Hope to wean vent today 4. Acute on CKD II-III:  -Creatinine  1.4-> 1.7 lasix on hold 5. [name]/VF: Recurrent VT on 6/22 -S/P VF 6/24 emergent bedside cardioversion.  -S/P VT 6/26 emergent bedside cardioversion.  - No VT over night. Continue amiodarone gtt.  - Keep K > 4.0 Mg > 2.0.  6. AFL - s/p previous ablation. Rate controlled Well-tolerated 7. [name]: Recent cath with stable CAD as above - No change to current plan.   8. [name]: Recent A1c 8.3 on 6/3.  - Cover with SSI in house.  9. Anemia:  - Post-op.  Got 1 unit PRBCs 6/22 and 6/25 . Hgb stable 9.6 10. Fevers  -  fevers resolved. Cx negative - on meropenem will continue for now  11. Deconditioning - severe. Need to mobilize with PT. CIR consult placed.  12. Dysphagia - for swallow study today   CRITICAL CARE Performed by: Glori Bickers  Total critical care time: 40 minutes  Critical care time was exclusive of separately billable procedures and treating other patients.  Critical care was necessary to treat or prevent imminent or life-threatening deterioration.  Critical care was time spent personally by me (independent of midlevel providers or residents) on the following activities: development of treatment plan with patient and/or surrogate as well as nursing, discussions with consultants, evaluation of patient's response to treatment, examination of patient, obtaining history from patient or surrogate, ordering and performing treatments and interventions, ordering and review of laboratory studies, ordering and review of radiographic studies, pulse oximetry and re-evaluation of patient's condition.   Length of Stay: 24  Glori Bickers, MD  08/29/2017, 12:31 PM  Advanced Heart Failure Team Pager [phone] (M-F; 7a - 4p)  Please contact [facility] for night-coverage after hours (4p -7a ) and weekends on [email or web address]

## 2017-08-29 NOTE — Progress Notes (Signed)
HeartMate 3  postop day #13 rounding Note  Subjective:   HeartMate 3 implantation with combined tricuspid valve annuloplasty repair June 17 for mixed ischemic and nonischemic cardiomyopathy, cardiac amyloid disease and preoperative moderate to severe RV dysfunction, preoperative moderate to severe tricuspid regurgitation  Patient had significant RV dysfunction and sternum was left open to maintain adequate hemodynamics.  During the night after surgeryThe patient developed  further signs of right ventricular dysfunction and was taken emergently back for mediastinal reexploration.  A clot was found compressing the right atrium from oozing from the pericardium where adhesions had been taken down at the time of surgery.  The patient returned to the operating room on June 20 for delayed sternal closure.  . Chronic atrial fibrillation. INR therapeutic.  Heparin off  Postop VT now improved for > weeks on amiodarone  Patient extubated 6/27 and stood at side of bed.  Chest x-ray shows significant improved with volume removal and aggressive diuresis.Able to get OOB to chair now with PT  Swallow study performed and pt on dysphagia diet  VAD parameters saisfactory, RV function improving, co-ox good on milrinone    LVAD INTERROGATION:  HeartMate II LVAD:  Flow 4 liters/min, speed 5200, power 3.8, PI 2.8.  Controller intact.   Objective:    Vital Signs:   Temp:  [96.1 F (35.6 C)-98.1 F (36.7 C)] 96.3 F (35.7 C) (06/30 2006) Pulse Rate:  [35-102] 35 (06/30 2000) Resp:  [13-36] 26 (06/30 2000) BP: (78-254)/(33-232) 110/64 (06/30 1800) SpO2:  [86 %-100 %] 96 % (06/30 2000) FiO2 (%):  [40 %] 40 % (06/30 1957) Weight:  [225 lb 1.4 oz (102.1 kg)] 225 lb 1.4 oz (102.1 kg) (06/30 0426) Last BM Date: 08/28/17 Mean arterial Pressure 75-80 mmHg  Intake/Output:   Intake/Output Summary (Last 24 hours) at 08/29/2017 2041 Last data filed at 08/29/2017 2000 Gross per 24 hour  Intake 3330 ml  Output  2244 ml  Net 1086 ml     Physical Exam: General:  Well appearing.  Intubated and sedated HEENT: normal Neck: supple. JVP . Carotids 2+ bilat; no bruits. No lymphadenopathy or thryomegaly appreciated. Cor: Mechanical heart sounds with LVAD hum present. Lungs: clear Abdomen: soft, nontender, nondistended. No hepatosplenomegaly. No bruits or masses. Good bowel sounds. Extremities: no cyanosis, clubbing, rash, edema Neuro: alert & orientedx3, cranial nerves grossly intact. moves all 4 extremities w/o difficulty. Affect pleasant  Telemetry: V paced at 90/min for underlying A. fib  Labs: Basic Metabolic Panel: Recent Labs  Lab 08/23/17 0412  08/25/17 0500  08/26/17 0402  08/27/17 0307 08/27/17 0954 08/28/17 0359 08/28/17 0740 08/29/17 0400  NA 135   < > 136   < > 137   < > 137 138 132* 138 137  K 3.3*   < > 2.9*   < > 3.3*   < > 3.5 4.5 NOT DONE 3.3* 4.1  CL 98*   < > 95*   < > 95*   < > 92* 94* 89* 93* 94*  CO2 25   < > 31   < > 33*   < > 34* 32 32 32 30  GLUCOSE 218*   < > 199*   < > 150*   < > 188* 234* 169* 57* 163*  BUN 23*   < > 42*   < > 50*   < > 57* 61* 60* 59* 58*  CREATININE 1.05   < > 1.18   < > 1.31*   < > 1.38*  1.41* 1.48* 1.47* 1.67*  CALCIUM 8.4*   < > 8.4*   < > 8.6*   < > 9.2 9.5 9.0 9.6 9.2  MG 1.8   < > 2.0  --  2.2  --  2.2  --  2.4  --  2.6*  PHOS 2.4*  --   --   --   --   --  4.5  --  4.0  --   --    < > = values in this interval not displayed.    Liver Function Tests: Recent Labs  Lab 08/26/17 0402 08/27/17 0307 08/28/17 0359 08/28/17 0740 08/29/17 0400  AST 318* 441* 320* 253* 133*  ALT 163* 220* 189* 177* 130*  ALKPHOS 76 85 86 92 83  BILITOT 1.6* 1.6* 3.7* 2.0* 1.8*  PROT 7.2 7.1 7.3 7.3 7.2  ALBUMIN 2.3* 2.2* 2.3* 2.3* 2.3*   No results for input(s): LIPASE, AMYLASE in the last 168 hours. No results for input(s): AMMONIA in the last 168 hours.  CBC: Recent Labs  Lab 08/23/17 0412 08/24/17 0352 08/25/17 0500 08/26/17 0402  08/27/17 0307 08/28/17 0359  WBC 13.7* 14.3* 13.0* 14.7* 13.7* 15.9*  NEUTROABS 11.0*  --   --   --   --   --   HGB 8.5* 8.1* 8.4* 8.8* 8.9* 9.4*  HCT 26.5* 25.2* 25.9* 27.6* 28.5* 30.4*  MCV 82.6 83.4 83.5 83.4 83.3 86.4  PLT 261 329 338 373 379 411*    INR: Recent Labs  Lab 08/25/17 0500 08/26/17 0402 08/27/17 0307 08/28/17 0359 08/29/17 0400  INR 2.54 2.18 2.16 2.63 2.87    Other results:  EKG:   Imaging: Dg Chest Port 1 View  Result Date: 08/29/2017 CLINICAL DATA:  LVAD present EXAM: PORTABLE CHEST - 1 VIEW COMPARISON:  the previous day's study FINDINGS: Stable left subclavian AICD, right IJ central venous catheter, right arm PICC, and LVAD. Slight improvement in the patchy interstitial airspace opacities throughout both lungs. Left retrocardiac consolidation/atelectasis persists. Previous median sternotomy and CABG. Heart size upper limits normal for technique. Probable left pleural effusion. No pneumothorax. IMPRESSION: 1. Partial improvement in bilateral infiltrates or edema. 2.  Support hardware stable in position. 3. Probable left pleural effusion. Electronically Signed   By: Lucrezia Europe M.D.   On: 08/29/2017 08:48   Dg Chest Port 1 View  Result Date: 08/28/2017 CLINICAL DATA:  Respiratory failure EXAM: PORTABLE CHEST 1 VIEW COMPARISON:  Yesterday FINDINGS: Increased airspace opacity on the right more than left. Low volumes with probable left pleural effusion. There is a right PICC with tip at the upper cavoatrial junction. Right IJ line with tip at the SVC. Biventricular pacer and AICD. No pneumothorax. Stable cardiopericardial enlargement. Status post extubation. A right IJ sheath has been removed. IMPRESSION: 1. Increased airspace opacity favoring edema. There has been interval extubation and atelectasis may contribute. 2. Remaining hardware in stable position. Electronically Signed   By: Monte Fantasia M.D.   On: 08/28/2017 07:45   Dg Swallowing Func-speech  Pathology  Result Date: 08/29/2017 Objective Swallowing Evaluation: Type of Study: MBS-Modified Barium Swallow Study  Patient Details Name: [name] [name] MRN: [id number] Date of Birth: 1950/07/23 Today's Date: 08/29/2017 Time: SLP Start Time (ACUTE ONLY): 1325 -SLP Stop Time (ACUTE ONLY): 1350 SLP Time Calculation (min) (ACUTE ONLY): 25 min Past Medical History: Past Medical History: Diagnosis Date . AICD (automatic cardioverter/defibrillator) present 02/05/2014  Upgrade to Medtronic biventricular ICD, serial number  [id number]  . Atrial flutter ([place])  04/2012  s/p TEE-EPS+RFCA 04/2012 . CAD (coronary artery disease) 7408,1448 X 2   RCA-T, 70% PL (off CFX), 99% Prox LAD/90% Dist LAD, S/P TAXUS stent x 2 . CHF (congestive heart failure) ([place])  . Chronic anticoagulation  . Chronic systolic heart failure ([place])  . CKD (chronic kidney disease)  . Diabetic retinopathy ([place])  . DM type 2 (diabetes mellitus, type 2) ([facility])   insulin dependent . HTN (hypertension)  . Hypercholesteremia   ablation . ICD (implantable cardiac defibrillator) in place  . Ischemic cardiomyopathy March 2015  20-25% 2D  . Nephrolithiasis  . Ventricular tachycardia [facility])  Past Surgical History: Past Surgical History: Procedure Laterality Date . ATRIAL FLUTTER ABLATION N/A 05/19/2012  Procedure: ATRIAL FLUTTER ABLATION;  Surgeon: Thompson Grayer, MD;  Location: [facility] CATH LAB;  Service: Cardiovascular;  Laterality: N/A; . BI-VENTRICULAR IMPLANTABLE CARDIOVERTER DEFIBRILLATOR UPGRADE N/A 02/05/2014  Procedure: BI-VENTRICULAR IMPLANTABLE CARDIOVERTER DEFIBRILLATOR UPGRADE;  Surgeon: Evans Lance, MD;  Location: [facility] CATH LAB;  Service: Cardiovascular;  Laterality: N/A; . BIV ICD GENERTAOR CHANGE OUT  02/05/2014  Upgrade to Medtronic biventricular ICD, serial number  [id number] by Dr. [name] . CARDIAC DEFIBRILLATOR PLACEMENT  2007   Medtronic Maximo VR, serial number [id number] . INSERTION OF IMPLANTABLE LEFT VENTRICULAR ASSIST DEVICE N/A 08/16/2017  Procedure:  INSERTION OF IMPLANTABLE LEFT VENTRICULAR ASSIST DEVICE-HM3;  Surgeon: Ivin Poot, MD;  Location: [place];  Service: Open Heart Surgery;  Laterality: N/A; . IR FLUORO GUIDE CV LINE RIGHT  08/12/2017 . IR US GUIDE VASC ACCESS RIGHT  08/12/2017 . MEDIASTINAL EXPLORATION  08/17/2017  Procedure: MEDIASTINAL REXPLORATION with evacuation of hematoma;  Surgeon: Prescott Gum, Collier Salina, MD;  Location: [facility] OR;  Service: Open Heart Surgery;; . PERCUTANEOUS CORONARY STENT INTERVENTION (PCI-S)  January 2002  PTCA/Stent Distal RCA . PERCUTANEOUS CORONARY STENT INTERVENTION (PCI-S)  June 2002  PTCA/Stent x 3 RCA, thrombolysis - failed . PERCUTANEOUS CORONARY STENT INTERVENTION (PCI-S)  July 2006  TAXUS stents to prox and distal LAD . RIGHT HEART CATH N/A 07/22/2017  Procedure: RIGHT HEART CATH;  Surgeon: Jol[place] Artist, MD;  Location: [place] CV LAB;  Service: Cardiovascular;  Laterality: N/A; . RIGHT HEART CATH N/A 08/13/2017  Procedure: RIGHT HEART CATH - swan;  Surgeon: Jolaine Artist, MD;  Location: [place] CV LAB;  Service: Cardiovascular;  Laterality: N/A; . RIGHT/LEFT HEART CATH AND CORONARY ANGIOGRAPHY N/A 02/18/2017  Procedure: RIGHT/LEFT HEART CATH AND CORONARY ANGIOGRAPHY;  Surgeon: Jolaine Artist, MD;  Location: [place] CV LAB;  Service: Cardiovascular;  Laterality: N/A; . STERNAL CLOSURE N/A 08/19/2017  Procedure: STERNAL CLOSURE;  Surgeon: Ivin Poot, MD;  Location: [place];  Service: Thoracic;  Laterality: N/A; . TEE WITHOUT CARDIOVERSION N/A 08/16/2017  Procedure: TRANSESOPHAGEAL ECHOCARDIOGRAM (TEE);  Surgeon: Prescott Gum, Collier Salina, MD;  Location: [place];  Service: Open Heart Surgery;  Laterality: N/A; . TEE WITHOUT CARDIOVERSION N/A 08/19/2017  Procedure: TRANSESOPHAGEAL ECHOCARDIOGRAM (TEE);  Surgeon: Prescott Gum, Collier Salina, MD;  Location: [place];  Service: Thoracic;  Laterality: N/A; . TRICUSPID VALVE REPLACEMENT N/A 08/16/2017  Procedure: TRICUSPID VALVE REPAIR using Oletta Lamas MC3 Ring size 30;  Surgeon:  Ivin Poot, MD;  Location: [place];  Service: Open Heart Surgery;  Laterality: N/A; HPI: 67 y.o. 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  Subjective: Pt arrives with RN Assessment / Plan /  Recommendation CHL IP CLINICAL IMPRESSIONS 08/29/2017 Clinical Impression Patient presents with moderate oral and severe pharyngeal dysphagia with gross, silent aspiration of thin, nectar and honey thick liquids. Suspect pt's dysphagia is at least in part acute, reversible with additional time post-extubation and with general strengthening. Oral stage is characterized by prolonged oral manipulation, decreased anterior to posterior transit and weak lingual manipulation. Premature spillage with liquids, decreased hyolarygneal excursion and delayed closure of the laryngeal vestibule lead to moderate silent aspiration before the swallow with thin, nectar and honey liquids. Cued cough ineffective. Chin tuck provided full airway protection with honey thick liquids, and prevented aspiration but not penetration with nectar. Moderate retention of soft solid in the valleculae, with slow, incomplete clearance through the cervical esophagus, with backflow noted. Recommend dys 1, honey thick liquids with full supervision/cuing for chin tuck with liquids. Will follow up for tolerance, repeat instrumental study (FEES may give better perspective of glottal closure) when appropriate.  SLP Visit Diagnosis Dysphagia, oral phase (R13.11);Dysphagia, pharyngeal phase (R13.13);Dysphagia, pharyngoesophageal phase (R13.14) Attention and concentration deficit following -- Frontal lobe and executive function deficit following -- Impact on safety and function Moderate aspiration risk   CHL IP TREATMENT RECOMMENDATION 08/29/2017 Treatment Recommendations Therapy as outlined in treatment plan below;F/U FEES in --- days  (Comment);F/U MBS in --- days (Comment)   Prognosis 08/29/2017 Prognosis for Safe Diet Advancement Good Barriers to Reach Goals -- Barriers/Prognosis Comment -- CHL IP DIET RECOMMENDATION 08/29/2017 SLP Diet Recommendations Dysphagia 1 (Puree) solids;Honey thick liquids Liquid Administration via Cup;Spoon Medication Administration Crushed with puree Compensations Chin tuck;Slow rate;Small sips/bites;Minimize environmental distractions Postural Changes Remain semi-upright after after feeds/meals (Comment);Seated upright at 90 degrees   CHL IP OTHER RECOMMENDATIONS 08/29/2017 Recommended Consults -- Oral Care Recommendations Oral care BID Other Recommendations Remove water pitcher;Have oral suction available;Prohibited food (jello, ice cream, thin soups);Order thickener from pharmacy   CHL IP FOLLOW UP RECOMMENDATIONS 08/29/2017 Follow up Recommendations Inpatient Rehab   CHL IP FREQUENCY AND DURATION 08/29/2017 Speech Therapy Frequency (ACUTE ONLY) min 2x/week Treatment Duration 2 weeks      CHL IP ORAL PHASE 08/29/2017 Oral Phase Impaired Oral - Pudding Teaspoon -- Oral - Pudding Cup -- Oral - Honey Teaspoon Weak lingual manipulation;Reduced posterior propulsion;Delayed oral transit;Premature spillage Oral - Honey Cup Weak lingual manipulation;Reduced posterior propulsion;Delayed oral transit;Premature spillage Oral - Nectar Teaspoon Weak lingual manipulation;Reduced posterior propulsion;Delayed oral transit;Premature spillage Oral - Nectar Cup -- Oral - Nectar Straw -- Oral - Thin Teaspoon Weak lingual manipulation;Delayed oral transit;Premature spillage;Reduced posterior propulsion Oral - Thin Cup Weak lingual manipulation;Reduced posterior propulsion;Delayed oral transit;Premature spillage Oral - Thin Straw -- Oral - Puree Weak lingual manipulation;Reduced posterior propulsion;Delayed oral transit Oral - Mech Soft Impaired mastication;Weak lingual manipulation;Reduced posterior propulsion;Delayed oral  transit;Lingual/palatal residue Oral - Regular -- Oral - Multi-Consistency -- Oral - Pill -- Oral Phase - Comment --  CHL IP PHARYNGEAL PHASE 08/29/2017 Pharyngeal Phase Impaired Pharyngeal- Pudding Teaspoon -- Pharyngeal -- Pharyngeal- Pudding Cup -- Pharyngeal -- Pharyngeal- Honey Teaspoon Delayed swallow initiation-pyriform sinuses;Reduced anterior laryngeal mobility;Reduced laryngeal elevation;Reduced airway/laryngeal closure;Penetration/Aspiration before swallow;Moderate aspiration;Compensatory strategies attempted (with notebox) Pharyngeal Material enters airway, passes BELOW cords without attempt by patient to eject out (silent aspiration) Pharyngeal- Honey Cup Delayed swallow initiation-pyriform sinuses;Reduced anterior laryngeal mobility;Reduced laryngeal elevation;Reduced airway/laryngeal closure;Penetration/Aspiration before swallow;Moderate aspiration;Compensatory strategies attempted (with notebox) Pharyngeal Material enters airway, passes BELOW cords without attempt by patient to eject out (silent aspiration) Pharyngeal- Nectar Teaspoon Delayed swallow initiation-pyriform sinuses;Reduced anterior laryngeal mobility;Reduced laryngeal elevation;Reduced airway/laryngeal closure;Penetration/Aspiration before  swallow;Moderate aspiration;Compensatory strategies attempted (with notebox) Pharyngeal Material enters airway, passes BELOW cords without attempt by patient to eject out (silent aspiration) Pharyngeal- Nectar Cup -- Pharyngeal -- Pharyngeal- Nectar Straw -- Pharyngeal -- Pharyngeal- Thin Teaspoon Delayed swallow initiation-pyriform sinuses;Reduced anterior laryngeal mobility;Reduced laryngeal elevation;Reduced airway/laryngeal closure;Penetration/Aspiration before swallow;Moderate aspiration Pharyngeal Material enters airway, passes BELOW cords without attempt by patient to eject out (silent aspiration) Pharyngeal- Thin Cup Delayed swallow initiation-pyriform sinuses;Reduced anterior laryngeal  mobility;Reduced laryngeal elevation;Reduced airway/laryngeal closure;Penetration/Aspiration before swallow;Moderate aspiration Pharyngeal Material enters airway, passes BELOW cords without attempt by patient to eject out (silent aspiration) Pharyngeal- Thin Straw -- Pharyngeal -- Pharyngeal- Puree Delayed swallow initiation-vallecula;Reduced anterior laryngeal mobility;Reduced laryngeal elevation Pharyngeal -- Pharyngeal- Mechanical Soft Delayed swallow initiation-vallecula;Reduced anterior laryngeal mobility;Reduced laryngeal elevation;Reduced tongue base retraction;Pharyngeal residue - valleculae;Pharyngeal residue - cp segment Pharyngeal -- Pharyngeal- Regular -- Pharyngeal -- Pharyngeal- Multi-consistency -- Pharyngeal -- Pharyngeal- Pill -- Pharyngeal -- Pharyngeal Comment --  CHL IP CERVICAL ESOPHAGEAL PHASE 08/29/2017 Cervical Esophageal Phase Impaired Pudding Teaspoon -- Pudding Cup -- Honey Teaspoon Esophageal backflow into cervical esophagus;Reduced cricopharyngeal relaxation Honey Cup -- Nectar Teaspoon -- Nectar Cup -- Nectar Straw -- Thin Teaspoon -- Thin Cup -- Thin Straw -- Puree -- Mechanical Soft Reduced cricopharyngeal relaxation;Esophageal backflow into cervical esophagus Regular -- Multi-consistency -- Pill -- Cervical Esophageal Comment -- Deneise Lever, MS, CCC-SLP Speech-Language Pathologist [phone] No flowsheet data found. Aliene Altes 08/29/2017, 3:16 PM                Medications:     Scheduled Medications: . amiodarone  200 mg Oral BID  . aspirin  81 mg Oral Daily  . bisacodyl  10 mg Oral Daily   Or  . bisacodyl  10 mg Rectal Daily  . Chlorhexidine Gluconate Cloth  6 each Topical Daily  . clonazepam  1 mg Oral BID  . collagenase   Topical Daily  . insulin aspart  0-24 Units Subcutaneous Q4H  . insulin detemir  30 Units Subcutaneous BID  . levalbuterol  0.63 mg Nebulization TID  . magnesium oxide  400 mg Oral BID  . mouth rinse  15 mL Mouth Rinse BID  .  metoCLOPramide (REGLAN) injection  10 mg Intravenous Q6H  . potassium chloride  40 mEq Oral BID  . QUEtiapine  25 mg Oral BID  . sodium chloride flush  10-40 mL Intracatheter Q12H  . spironolactone  25 mg Oral BID  . Warfarin - Pharmacist Dosing Inpatient   Does not apply q1800    Infusions: . sodium chloride 10 mL/hr at 08/28/17 1412  . EPINEPHrine 4 mg in dextrose 5% 250 mL infusion (16 mcg/mL) 2 mcg/min (08/29/17 2000)  . famotidine (PEPCID) IV Stopped (08/29/17 1052)  . lactated ringers Stopped (08/19/17 1602)  . meropenem (MERREM) IV Stopped (08/29/17 1541)  . milrinone 0.375 mcg/kg/min (08/29/17 1305)  . norepinephrine (LEVOPHED) Adult infusion Stopped (08/29/17 1724)  . vancomycin Stopped (08/29/17 0645)    PRN Medications: acetaminophen (TYLENOL) oral liquid 160 mg/5 mL, fentaNYL (SUBLIMAZE) injection, hydrALAZINE, lactated ringers, midazolam, morphine injection, ondansetron (ZOFRAN) IV, oxyCODONE, RESOURCE THICKENUP CLEAR, sodium chloride flush   Assessment:  Mixed ischemic and nonischemic cardiomyopathy with cardiac amyloid disease Preoperative moderate to severe RV dysfunction as well as moderate to severe tricuspid regurgitation Stable after delayed sternal closure  Plan/Discussion:   sloww progress from delayed chest closure for RV dysfunction, delayed extubation due to volume overlaod, postop VT requiring [place] Now filrly stable but very weak  CXR improving  with diuresis and mobilization   I reviewed the LVAD parameters from today, and compared the results to the patient's prior recorded data.  No programming changes were made.  The LVAD is functioning within specified parameters.  The patient performs LVAD self-test daily.  LVAD interrogation was negative for any significant power changes, alarms or PI events/speed drops.  LVAD equipment check completed and is in good working order.  Back-up equipment present.   LVAD education done on emergency procedures and  precautions and reviewed exit site care.  Length of Stay: [place] [facility] 08/29/2017, 8:41 PM

## 2017-08-29 NOTE — Plan of Care (Signed)
  Problem: Education: Goal: Knowledge of General Education information will improve Outcome: Progressing   Problem: Health Behavior/Discharge Planning: Goal: Ability to manage health-related needs will improve Outcome: Progressing   Problem: Clinical Measurements: Goal: Ability to maintain clinical measurements within normal limits will improve Outcome: Progressing Goal: Will remain free from infection Outcome: Progressing Goal: Diagnostic test results will improve Outcome: Progressing Goal: Respiratory complications will improve Outcome: Progressing Goal: Cardiovascular complication will be avoided Outcome: Progressing   Problem: Activity: Goal: Risk for activity intolerance will decrease Outcome: Progressing   Problem: Nutrition: Goal: Adequate nutrition will be maintained Outcome: Progressing   Problem: Coping: Goal: Level of anxiety will decrease Outcome: Progressing   Problem: Elimination: Goal: Will not experience complications related to bowel motility Outcome: Progressing Goal: Will not experience complications related to urinary retention Outcome: Progressing   Problem: Pain Managment: Goal: General experience of comfort will improve Outcome: Progressing   Problem: Safety: Goal: Ability to remain free from injury will improve Outcome: Progressing   Problem: Skin Integrity: Goal: Risk for impaired skin integrity will decrease Outcome: Progressing   Problem: Spiritual Needs Goal: Ability to function at adequate level Outcome: Progressing   Problem: Education: Goal: Ability to demonstrate management of disease process will improve Outcome: Progressing Goal: Ability to verbalize understanding of medication therapies will improve Outcome: Progressing   Problem: Activity: Goal: Capacity to carry out activities will improve Outcome: Progressing   Problem: Cardiac: Goal: Ability to achieve and maintain adequate cardiopulmonary perfusion will  improve Outcome: Progressing

## 2017-08-29 NOTE — Progress Notes (Signed)
Modified Barium Swallow Progress Note  Patient Details  Name: [name] [name] MRN: [id number] Date of Birth: [birth year]  Today's Date: 08/29/2017  Modified Barium Swallow completed.  Full report located under Chart Review in the Imaging Section.  Brief recommendations include the following:  Clinical Impression  Patient presents with moderate oral and severe pharyngeal dysphagia with gross, silent aspiration of thin, nectar and honey thick liquids. Suspect pt's dysphagia is at least in part acute, reversible with additional time post-extubation and with general strengthening. Oral stage is characterized by prolonged oral manipulation, decreased anterior to posterior transit and weak lingual manipulation. Premature spillage with liquids, decreased hyolarygneal excursion and delayed closure of the laryngeal vestibule lead to moderate silent aspiration before the swallow with thin, nectar and honey liquids. Cued cough ineffective. Chin tuck provided full airway protection with honey thick liquids, and prevented aspiration but not penetration with nectar. Moderate retention of soft solid in the valleculae, with slow, incomplete clearance through the cervical esophagus, with backflow noted. Recommend dys 1, honey thick liquids with full supervision/cuing for chin tuck with liquids. Will follow up for tolerance, repeat instrumental study (FEES may give better perspective of glottal closure) when appropriate.    Swallow Evaluation Recommendations       SLP Diet Recommendations: Dysphagia 1 (Puree) solids;Honey thick liquids   Liquid Administration via: Cup;Spoon   Medication Administration: Crushed with puree   Supervision: Full supervision/cueing for compensatory strategies   Compensations: Chin tuck;Slow rate;Small sips/bites;Minimize environmental distractions   Postural Changes: Remain semi-upright after after feeds/meals (Comment);Seated upright at 90 degrees   Oral Care Recommendations:  Oral care BID   Other Recommendations: Remove water pitcher;Have oral suction available;Prohibited food (jello, ice cream, thin soups);Order thickener from [place], [place], [place] Speech-Language Pathologist [phone]  [name] [name] 08/29/2017,3:15 PM

## 2017-08-29 NOTE — Progress Notes (Signed)
[place] for coumadin Indication: LVAD  Allergies  Allergen Reactions  . Penicillins Hives, Itching and Other (See Comments)    Has patient had a PCN reaction causing immediate rash, facial/tongue/throat swelling, SOB or lightheadedness with hypotension:# # Yes # # Has patient had a PCN reaction causing severe rash involving mucus membranes or skin necrosis: No Has patient had a PCN reaction that required hospitalization: No Has patient had a PCN reaction occurring within the last 10 years: No If all of the above answers are "NO", then may proceed with Cephalosporin use.     Patient Measurements: Height: 5\' 11"  (180.3 cm) Weight: 225 lb 1.4 oz (102.1 kg) IBW/kg (Calculated) : 75.3 Heparin Dosing Weight: 98.3 kg  Vital Signs: Temp: 96.1 F (35.6 C) (06/30 1300) Temp Source: Oral (06/30 1200) BP: 102/75 (06/30 1200) Pulse Rate: 86 (06/30 1300)  Labs: Recent Labs    08/27/17 0307  08/28/17 0359 08/28/17 0740 08/29/17 0400  HGB 8.9*  --  9.4*  --   --   HCT 28.5*  --  30.4*  --   --   PLT 379  --  411*  --   --   LABPROT 24.0*  --  27.9*  --  29.8*  INR 2.16  --  2.63  --  2.87  CREATININE 1.38*   < > 1.48* 1.47* 1.67*   < > = values in this interval not displayed.    Estimated Creatinine Clearance: 52.2 mL/min (A) (by C-G formula based on SCr of 1.67 mg/dL (H)).   Medical History: Past Medical History:  Diagnosis Date  . AICD (automatic cardioverter/defibrillator) present 02/05/2014   Upgrade to [employer] biventricular ICD, serial number  [id number]   . Atrial flutter ([place]) 04/2012   s/p TEE-EPS+RFCA 04/2012  . CAD (coronary artery disease) [phone] X 2    RCA-T, 70% PL (off CFX), 99% Prox LAD/90% Dist LAD, S/P TAXUS stent x 2  . CHF (congestive heart failure) ([place])   . Chronic anticoagulation   . Chronic systolic heart failure ([place])   . CKD (chronic kidney disease)   . Diabetic retinopathy ([place])   . DM type 2 (diabetes  mellitus, type 2) ([facility])    insulin dependent  . HTN (hypertension)   . Hypercholesteremia    ablation  . ICD (implantable cardiac defibrillator) in place   . Ischemic cardiomyopathy March 2015   20-25% 2D   . Nephrolithiasis   . Ventricular tachycardia ([facility])     Medications:  Scheduled:  . amiodarone  200 mg Oral BID  . aspirin  81 mg Oral Daily  . bisacodyl  10 mg Oral Daily   Or  . bisacodyl  10 mg Rectal Daily  . Chlorhexidine Gluconate Cloth  6 each Topical Daily  . clonazepam  1 mg Oral BID  . collagenase   Topical Daily  . insulin aspart  0-24 Units Subcutaneous Q4H  . insulin detemir  30 Units Subcutaneous BID  . levalbuterol  0.63 mg Nebulization TID  . magnesium oxide  400 mg Oral BID  . mouth rinse  15 mL Mouth Rinse BID  . metoCLOPramide (REGLAN) injection  10 mg Intravenous Q6H  . potassium chloride  40 mEq Oral BID  . QUEtiapine  25 mg Oral BID  . sodium chloride flush  10-40 mL Intracatheter Q12H  . spironolactone  25 mg Oral BID  . warfarin  1 mg Oral ONCE-1800  . Warfarin - Pharmacist Dosing Inpatient  Does not apply q1800    Assessment: 42 yom who underwent LVAD placement on 6/17  S/p Heparin drip stopped with INR > 2.  INR slightly > goal 2.8 h/h stable pltc stable No s/sx of bleeding. On concurrent amiodarone, which can impact INR sensitivity.  LFTs/Tbili elevated but  trending down.  LDH 300-400> 200  Goal of Therapy:  INR goal 2-2.5 Monitor platelets by anticoagulation protocol: Yes   Plan:   Warfarin 1mg  x1  Monitor s/s bleeding  Bonnita Nasuti Pharm.D. CPP, BCPS Clinical Pharmacist [phone] 08/29/2017 4:06 PM

## 2017-08-29 NOTE — Consult Note (Signed)
Physical Medicine and Rehabilitation Consult Reason for Consult: Severe deconditioning S/P VAD Referring Physician: Jolaine Artist, MD   HPI: [name] [name] is a 67 y.o. male with past medical history of ventricular tachycardia, ischemic cardiomyopathy status post ICD, hypertension, diabetes mellitus type 2 with retinopathy, CKD, chronic systolic congestive heart failure, CAD, atrial flutter presented on 08/11/2017 due to CHF.  History taken from chart review.  Patient was admitted with plans for home milrinone initiation.  He has noted to be fluid overloaded and ultimately had an LVAD placed on 6/17.  Hospital course complicated by cardiogenic shock (requiring pressors), respiratory failure, V. tach/V. Fib, tachypnea, leukocytosis, acute blood loss anemia.    Review of Systems  Constitutional: Positive for malaise/fatigue.  Cardiovascular: Positive for chest pain.       MSK  Neurological: Positive for weakness.  All other systems reviewed and are negative.  Past Medical History:  Diagnosis Date  . AICD (automatic cardioverter/defibrillator) present 02/05/2014   Upgrade to Medtronic biventricular ICD, serial number  [id number]   . Atrial flutter ([place]) 04/2012   s/p TEE-EPS+RFCA 04/2012  . CAD (coronary artery disease) 1497,0263 X 2    RCA-T, 70% PL (off CFX), 99% Prox LAD/90% Dist LAD, S/P TAXUS stent x 2  . CHF (congestive heart failure) ([place])   . Chronic anticoagulation   . Chronic systolic heart failure ([address])   . CKD (chronic kidney disease)   . Diabetic retinopathy ([place])   . DM type 2 (diabetes mellitus, type 2) (HCC)    insulin dependent  . HTN (hypertension)   . Hypercholesteremia    ablation  . ICD (implantable cardiac defibrillator) in place   . Ischemic cardiomyopathy March 2015   20-25% 2D   . Nephrolithiasis   . Ventricular tachycardia [facility])    Past Surgical History:  Procedure Laterality Date  . ATRIAL FLUTTER ABLATION N/A 05/19/2012   Procedure:  ATRIAL FLUTTER ABLATION;  Surgeon: Thompson Grayer, MD;  Location: [facility] CATH LAB;  Service: Cardiovascular;  Laterality: N/A;  . BI-VENTRICULAR IMPLANTABLE CARDIOVERTER DEFIBRILLATOR UPGRADE N/A 02/05/2014   Procedure: BI-VENTRICULAR IMPLANTABLE CARDIOVERTER DEFIBRILLATOR UPGRADE;  Surgeon: Evans Lance, MD;  Location: [facility] CATH LAB;  Service: Cardiovascular;  Laterality: N/A;  . BIV ICD GENERTAOR CHANGE OUT  02/05/2014   Upgrade to Medtronic biventricular ICD, serial number  [id number] by Dr. [name]  . CARDIAC DEFIBRILLATOR PLACEMENT  2007    Medtronic Maximo VR, serial number [id number]  . INSERTION OF IMPLANTABLE LEFT VENTRICULAR ASSIST DEVICE N/A 08/16/2017   Procedure: INSERTION OF IMPLANTABLE LEFT VENTRICULAR ASSIST DEVICE-HM3;  Surgeon: Ivin Poot, MD;  Location: [place];  Service: Open Heart Surgery;  Laterality: N/A;  . IR FLUORO GUIDE CV LINE RIGHT  08/12/2017  . IR US GUIDE VASC ACCESS RIGHT  08/12/2017  . MEDIASTINAL EXPLORATION  08/17/2017   Procedure: MEDIASTINAL REXPLORATION with evacuation of hematoma;  Surgeon: Prescott Gum, Collier Salina, MD;  Location: [facility] OR;  Service: Open Heart Surgery;;  . PERCUTANEOUS CORONARY STENT INTERVENTION (PCI-S)  January 2002   PTCA/Stent Distal RCA  . PERCUTANEOUS CORONARY STENT INTERVENTION (PCI-S)  June 2002   PTCA/Stent x 3 RCA, thrombolysis - failed  . PERCUTANEOUS CORONARY STENT INTERVENTION (PCI-S)  July 2006   TAXUS stents to prox and distal LAD  . RIGHT HEART CATH N/A 07/22/2017   Procedure: RIGHT HEART CATH;  Surgeon: Jolaine Artist, MD;  Location: [place] CV LAB;  Service: Cardiovascular;  Laterality: N/A;  .  RIGHT HEART CATH N/A 08/13/2017   Procedure: RIGHT HEART CATH - swan;  Surgeon: Jolaine Artist, MD;  Location: [place] CV LAB;  Service: Cardiovascular;  Laterality: N/A;  . RIGHT/LEFT HEART CATH AND CORONARY ANGIOGRAPHY N/A 02/18/2017   Procedure: RIGHT/LEFT HEART CATH AND CORONARY ANGIOGRAPHY;  Surgeon: Jolaine Artist, MD;   Location: [place] CV LAB;  Service: Cardiovascular;  Laterality: N/A;  . STERNAL CLOSURE N/A 08/19/2017   Procedure: STERNAL CLOSURE;  Surgeon: Ivin Poot, MD;  Location: [place];  Service: Thoracic;  Laterality: N/A;  . TEE WITHOUT CARDIOVERSION N/A 08/16/2017   Procedure: TRANSESOPHAGEAL ECHOCARDIOGRAM (TEE);  Surgeon: Prescott Gum, Collier Salina, MD;  Location: [place];  Service: Open Heart Surgery;  Laterality: N/A;  . TEE WITHOUT CARDIOVERSION N/A 08/19/2017   Procedure: TRANSESOPHAGEAL ECHOCARDIOGRAM (TEE);  Surgeon: Prescott Gum, Collier Salina, MD;  Location: [place];  Service: Thoracic;  Laterality: N/A;  . TRICUSPID VALVE REPLACEMENT N/A 08/16/2017   Procedure: TRICUSPID VALVE REPAIR using Oletta Lamas MC3 Ring size 30;  Surgeon: Ivin Poot, MD;  Location: [place];  Service: Open Heart Surgery;  Laterality: N/A;   Family History  Problem Relation Age of Onset  . Heart failure Father        Deceased  . Alzheimer's disease Mother        Living  . Heart attack Mother   . CAD Brother   . Other Brother        blood cancer  . CAD Brother   . Prostate cancer Brother   . CAD Brother   . CAD Brother   . Healthy Son   . Healthy Daughter   . Diabetes Brother   . Stomach cancer Neg Hx   . Colon cancer Neg Hx    Social History:  reports that he quit smoking about 17 years ago. His smoking use included cigarettes. He has a 29.00 pack-year smoking history. He has never used smokeless tobacco. He reports that he does not drink alcohol or use drugs. Allergies:  Allergies  Allergen Reactions  . Penicillins Hives, Itching and Other (See Comments)    Has patient had a PCN reaction causing immediate rash, facial/tongue/throat swelling, SOB or lightheadedness with hypotension:# # Yes # # Has patient had a PCN reaction causing severe rash involving mucus membranes or skin necrosis: No Has patient had a PCN reaction that required hospitalization: No Has patient had a PCN reaction occurring within the last 10 years:  No If all of the above answers are "NO", then may proceed with Cephalosporin use.    Medications Prior to Admission  Medication Sig Dispense Refill  . acetaminophen (TYLENOL) 500 MG tablet Take 1,000 mg by mouth every 6 (six) hours as needed for moderate pain or headache.    Marland Kitchen apixaban (ELIQUIS) 5 MG TABS tablet Take 1 tablet (5 mg total) by mouth 2 (two) times daily. 60 tablet 6  . atorvastatin (LIPITOR) 80 MG tablet TAKE ONE TABLET BY MOUTH ONCE DAILY (Patient taking differently: TAKE 80 MG BY MOUTH ONCE DAILY) 90 tablet 3  . digoxin (LANOXIN) 0.125 MG tablet Take 1 tablet (0.125 mg total) by mouth daily. (Patient taking differently: Take 0.125 mg by mouth at bedtime. ) 90 tablet 3  . Dulaglutide (TRULICITY) 1.5 KD/3.2IZ SOPN Inject contents of pen weekly (Patient taking differently: Inject 1.5 mg into the skin every Friday. ) 4 pen 2  . gabapentin (NEURONTIN) 300 MG capsule Take 1 capsule (300 mg total) by mouth 3 (three) times  daily. 270 capsule 3  . insulin NPH-regular Human (NOVOLIN 70/30 RELION) (70-30) 100 UNIT/ML injection Inject 30 Units into the skin 2 (two) times daily with a meal. 10 mL 12  . losartan (COZAAR) 25 MG tablet Take 12.5 mg by mouth at bedtime.    . magnesium oxide (MAG-OX) 400 MG tablet Take 1 tablet (400 mg total) by mouth daily. 90 tablet 3  . metFORMIN (GLUCOPHAGE-XR) 750 MG 24 hr tablet TAKE 2 TABLETS BY MOUTH ONCE DAILY WITH BREAKFAST 180 tablet 0  . Multiple Vitamins-Minerals (MULTIVITAMIN PO) Take 1 capsule by mouth daily.     . sotalol (BETAPACE) 120 MG tablet TAKE ONE TABLET BY MOUTH TWICE DAILY (Patient taking differently: TAKE 120MG BY MOUTH TWICE DAILY) 180 tablet 3  . torsemide (DEMADEX) 100 MG tablet Take 50 mg by mouth See admin instructions. Take 50 mg by mouth daily except DO NOT TAKE on Sunday      Home: [place] expects to be discharged to:: Private residence Living Arrangements: Spouse/significant other Type of Home:  Financial controller: Standard Home Equipment: Careers adviser History: Prior Function Level of Independence: Independent Comments: drives, cooks, works as a Engineer, maintenance (IT) Status:  Mobility: JAARS bed mobility: Needs Assistance Bed Mobility: Rolling, Sidelying to Sit, Sit to Sidelying Rolling: Max assist, +2 for physical assistance Sidelying to sit: Max assist, +2 for physical assistance Sit to sidelying: Max assist, +2 for physical assistance General bed mobility comments: physical assist to bend knees and roll bil for positioning, assist to elevate trunk and bring legs off of and onto bed Transfers Overall transfer level: Needs assistance Equipment used: 2 person hand held assist Transfers: Sit to/from Stand Sit to Stand: Max assist, +2 physical assistance General transfer comment: bil knees blocked with assist of belt and pad for anterior translation and rise. Pt able to tolerate bil LE weight bearing but increased time and difficulty to extend trunk. Pt with weight off sacrum grossly 40 sec Ambulation/Gait General Gait Details: unable    ADL: ADL Overall ADL's : Needs assistance/impaired Eating/Feeding: NPO Grooming: Wash/dry hands, Wash/dry face, Oral care, Brushing hair, Total assistance, Sitting Upper Body Bathing: With caregiver independent assisting, Sitting, Bed level Lower Body Bathing: Total assistance, Bed level Upper Body Dressing : Total assistance, Sitting Lower Body Dressing: Total assistance, Bed level, Sit to/from stand Toilet Transfer: Total assistance Toilet Transfer Details (indicate cue type and reason): uanble to attempt  Toileting- Clothing Manipulation and Hygiene: Total assistance, Bed level Functional mobility during ADLs: Maximal assistance, +2 for physical assistance  Cognition: Cognition Overall Cognitive Status: Impaired/Different from baseline Orientation Level: Oriented X4 Cognition Arousal/Alertness:  Lethargic Behavior During Therapy: Flat affect Overall Cognitive Status: Impaired/Different from baseline Area of Impairment: Orientation, Memory, Attention, Following commands, Safety/judgement, Problem solving Orientation Level: Disoriented to, Time, Situation Current Attention Level: Sustained Memory: Decreased recall of precautions, Decreased short-term memory Following Commands: Follows one step commands inconsistently, Follows one step commands with increased time Safety/Judgement: Decreased awareness of safety, Decreased awareness of deficits Problem Solving: Slow processing, Decreased initiation, Difficulty sequencing, Requires verbal cues, Requires tactile cues General Comments: pt with difficulty maintaining eyes open, stated he was in the hospital for a transplant, unaware of controller, unable to maintain or correct sitting balance with cues  Blood pressure 96/60, pulse 88, temperature (!) 96.3 F (35.7 C), temperature source Axillary, resp. rate (!) 27, height _0  (1.803 m), weight 102.1 kg (225 lb 1.4 oz), SpO2 91 %. Physical Exam  Vitals reviewed. Constitutional: He appears well-developed and well-nourished.  HENT:  Head: Normocephalic and atraumatic.  Eyes: EOM are normal. Right eye exhibits no discharge. Left eye exhibits no discharge.  Neck: Normal range of motion. Neck supple.  Cardiovascular:  Irregularly irregular  Respiratory: Breath sounds normal.  + Tachypnea  GI: Soft. Bowel sounds are normal.  Musculoskeletal:  Bilateral lower extremity edema Tremors noted Motor: Grossly 3+/5 throughout  Neurological: He is alert.  Skin: Skin is warm and dry.  Psychiatric: His affect is blunt. His speech is delayed. He is slowed.    Results for orders placed or performed during the hospital encounter of 08/11/17 (from the past 24 hour(s))  Glucose, capillary     Status: Abnormal   Collection Time: 08/28/17 11:39 PM  Result Value Ref Range   Glucose-Capillary 168 (H)  70 - 99 mg/dL   Comment 1 Venous Specimen   Cooxemetry Panel (carboxy, met, total hgb, O2 sat)     Status: Abnormal   Collection Time: 08/29/17  3:50 AM  Result Value Ref Range   Total hemoglobin 9.6 (L) 12.0 - 16.0 g/dL   O2 Saturation 63.1 %   Carboxyhemoglobin 1.4 0.5 - 1.5 %   Methemoglobin 1.6 (H) 0.0 - 1.5 %  Glucose, capillary     Status: Abnormal   Collection Time: 08/29/17  3:54 AM  Result Value Ref Range   Glucose-Capillary 136 (H) 70 - 99 mg/dL   Comment 1 Venous Specimen   Lactate dehydrogenase     Status: Abnormal   Collection Time: 08/29/17  4:00 AM  Result Value Ref Range   LDH 269 (H) 98 - 192 U/L  Protime-INR     Status: Abnormal   Collection Time: 08/29/17  4:00 AM  Result Value Ref Range   Prothrombin Time 29.8 (H) 11.4 - 15.2 seconds   INR 2.87   Magnesium     Status: Abnormal   Collection Time: 08/29/17  4:00 AM  Result Value Ref Range   Magnesium 2.6 (H) 1.7 - 2.4 mg/dL  Comprehensive metabolic panel     Status: Abnormal   Collection Time: 08/29/17  4:00 AM  Result Value Ref Range   Sodium 137 135 - 145 mmol/L   Potassium 4.1 3.5 - 5.1 mmol/L   Chloride 94 (L) 98 - 111 mmol/L   CO2 30 22 - 32 mmol/L   Glucose, Bld 163 (H) 70 - 99 mg/dL   BUN 58 (H) 8 - 23 mg/dL   Creatinine, Ser 1.67 (H) 0.61 - 1.24 mg/dL   Calcium 9.2 8.9 - 10.3 mg/dL   Total Protein 7.2 6.5 - 8.1 g/dL   Albumin 2.3 (L) 3.5 - 5.0 g/dL   AST 133 (H) 15 - 41 U/L   ALT 130 (H) 0 - 44 U/L   Alkaline Phosphatase 83 38 - 126 U/L   Total Bilirubin 1.8 (H) 0.3 - 1.2 mg/dL   GFR calc non Af Amer 41 (L) >60 mL/min   GFR calc Af Amer 47 (L) >60 mL/min   Anion gap 13 5 - 15  Glucose, capillary     Status: Abnormal   Collection Time: 08/29/17  8:04 AM  Result Value Ref Range   Glucose-Capillary 141 (H) 70 - 99 mg/dL   Comment 1 Notify RN   Glucose, capillary     Status: Abnormal   Collection Time: 08/29/17 12:27 PM  Result Value Ref Range   Glucose-Capillary 202 (H) 70 - 99 mg/dL    Comment 1  Notify RN   Glucose, capillary     Status: Abnormal   Collection Time: 08/29/17  8:03 PM  Result Value Ref Range   Glucose-Capillary 158 (H) 70 - 99 mg/dL   Comment 1 Notify RN   Glucose, capillary     Status: Abnormal   Collection Time: 08/29/17  9:15 PM  Result Value Ref Range   Glucose-Capillary 125 (H) 70 - 99 mg/dL   Comment 1 Venous Specimen    Dg Chest Port 1 View  Result Date: 08/29/2017 CLINICAL DATA:  LVAD present EXAM: PORTABLE CHEST - 1 VIEW COMPARISON:  the previous day's study FINDINGS: Stable left subclavian AICD, right IJ central venous catheter, right arm PICC, and LVAD. Slight improvement in the patchy interstitial airspace opacities throughout both lungs. Left retrocardiac consolidation/atelectasis persists. Previous median sternotomy and CABG. Heart size upper limits normal for technique. Probable left pleural effusion. No pneumothorax. IMPRESSION: 1. Partial improvement in bilateral infiltrates or edema. 2.  Support hardware stable in position. 3. Probable left pleural effusion. Electronically Signed   By: Lucrezia Europe M.D.   On: 08/29/2017 08:48   Dg Chest Port 1 View  Result Date: 08/28/2017 CLINICAL DATA:  Respiratory failure EXAM: PORTABLE CHEST 1 VIEW COMPARISON:  Yesterday FINDINGS: Increased airspace opacity on the right more than left. Low volumes with probable left pleural effusion. There is a right PICC with tip at the upper cavoatrial junction. Right IJ line with tip at the SVC. Biventricular pacer and AICD. No pneumothorax. Stable cardiopericardial enlargement. Status post extubation. A right IJ sheath has been removed. IMPRESSION: 1. Increased airspace opacity favoring edema. There has been interval extubation and atelectasis may contribute. 2. Remaining hardware in stable position. Electronically Signed   By: Monte Fantasia M.D.   On: 08/28/2017 07:45   Dg Swallowing Func-speech Pathology  Result Date: 08/29/2017 Objective Swallowing Evaluation: Type  of Study: MBS-Modified Barium Swallow Study  Patient Details Name: [name] [name] MRN: [id number] Date of Birth: November 01, 1950 Today's Date: 08/29/2017 Time: SLP Start Time (ACUTE ONLY): 1325 -SLP Stop Time (ACUTE ONLY): 1350 SLP Time Calculation (min) (ACUTE ONLY): 25 min Past Medical History: Past Medical History: Diagnosis Date . AICD (automatic cardioverter/defibrillator) present 02/05/2014  Upgrade to Medtronic biventricular ICD, serial number  [id number]  . Atrial flutter ([place]) 04/2012  s/p TEE-EPS+RFCA 04/2012 . CAD (coronary artery disease) 4098,1191 X 2   RCA-T, 70% PL (off CFX), 99% Prox LAD/90% Dist LAD, S/P TAXUS stent x 2 . CHF (congestive heart failure) ([place])  . Chronic anticoagulation  . Chronic systolic heart failure ([place])  . CKD (chronic kidney disease)  . Diabetic retinopathy ([place])  . DM type 2 (diabetes mellitus, type 2) (HCC)   insulin dependent . HTN (hypertension)  . Hypercholesteremia   ablation . ICD (implantable cardiac defibrillator) in place  . Ischemic cardiomyopathy March 2015  20-25% 2D  . Nephrolithiasis  . Ventricular tachycardia [facility])  Past Surgical History: Past Surgical History: Procedure Laterality Date . ATRIAL FLUTTER ABLATION N/A 05/19/2012  Procedure: ATRIAL FLUTTER ABLATION;  Surgeon: Thompson Grayer, MD;  Location: [facility] CATH LAB;  Service: Cardiovascular;  Laterality: N/A; . BI-VENTRICULAR IMPLANTABLE CARDIOVERTER DEFIBRILLATOR UPGRADE N/A 02/05/2014  Procedure: BI-VENTRICULAR IMPLANTABLE CARDIOVERTER DEFIBRILLATOR UPGRADE;  Surgeon: Evans Lance, MD;  Location: [facility] CATH LAB;  Service: Cardiovascular;  Laterality: N/A; . BIV ICD GENERTAOR CHANGE OUT  02/05/2014  Upgrade to Medtronic biventricular ICD, serial number  [id number] H by Dr. [name] . CARDIAC DEFIBRILLATOR PLACEMENT  2007   Medtronic Maximo  VR, serial number [id number] . INSERTION OF IMPLANTABLE LEFT VENTRICULAR ASSIST DEVICE N/A 08/16/2017  Procedure: INSERTION OF IMPLANTABLE LEFT VENTRICULAR ASSIST DEVICE-HM3;  Surgeon: Ivin Poot, MD;  Location: [place];  Service: Open Heart Surgery;  Laterality: N/A; . IR FLUORO GUIDE CV LINE RIGHT  08/12/2017 . IR US GUIDE VASC ACCESS RIGHT  08/12/2017 . MEDIASTINAL EXPLORATION  08/17/2017  Procedure: MEDIASTINAL REXPLORATION with evacuation of hematoma;  Surgeon: Prescott Gum, Collier Salina, MD;  Location: [facility] OR;  Service: Open Heart Surgery;; . PERCUTANEOUS CORONARY STENT INTERVENTION (PCI-S)  January 2002  PTCA/Stent Distal RCA . PERCUTANEOUS CORONARY STENT INTERVENTION (PCI-S)  June 2002  PTCA/Stent x 3 RCA, thrombolysis - failed . PERCUTANEOUS CORONARY STENT INTERVENTION (PCI-S)  July 2006  TAXUS stents to prox and distal LAD . RIGHT HEART CATH N/A 07/22/2017  Procedure: RIGHT HEART CATH;  Surgeon: Jolaine Artist, MD;  Location: [place] CV LAB;  Service: Cardiovascular;  Laterality: N/A; . RIGHT HEART CATH N/A 08/13/2017  Procedure: RIGHT HEART CATH - swan;  Surgeon: Jolaine Artist, MD;  Location: [place] CV LAB;  Service: Cardiovascular;  Laterality: N/A; . RIGHT/LEFT HEART CATH AND CORONARY ANGIOGRAPHY N/A 02/18/2017  Procedure: RIGHT/LEFT HEART CATH AND CORONARY ANGIOGRAPHY;  Surgeon: Jolaine Artist, MD;  Location: [place] CV LAB;  Service: Cardiovascular;  Laterality: N/A; . STERNAL CLOSURE N/A 08/19/2017  Procedure: STERNAL CLOSURE;  Surgeon: Ivin Poot, MD;  Location: [place];  Service: Thoracic;  Laterality: N/A; . TEE WITHOUT CARDIOVERSION N/A 08/16/2017  Procedure: TRANSESOPHAGEAL ECHOCARDIOGRAM (TEE);  Surgeon: Prescott Gum, Collier Salina, MD;  Location: [place];  Service: Open Heart Surgery;  Laterality: N/A; . TEE WITHOUT CARDIOVERSION N/A 08/19/2017  Procedure: TRANSESOPHAGEAL ECHOCARDIOGRAM (TEE);  Surgeon: Prescott Gum, Collier Salina, MD;  Location: [place];  Service: Thoracic;  Laterality: N/A; . TRICUSPID VALVE REPLACEMENT N/A 08/16/2017  Procedure: TRICUSPID VALVE REPAIR using Oletta Lamas MC3 Ring size 30;  Surgeon: Ivin Poot, MD;  Location: [place];  Service: Open Heart Surgery;   Laterality: N/A; HPI: 67 y.o. 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  Subjective: Pt arrives with RN Assessment / Plan / Recommendation CHL IP CLINICAL IMPRESSIONS 08/29/2017 Clinical Impression Patient presents with moderate oral and severe pharyngeal dysphagia with gross, silent aspiration of thin, nectar and honey thick liquids. Suspect pt's dysphagia is at least in part acute, reversible with additional time post-extubation and with general strengthening. Oral stage is characterized by prolonged oral manipulation, decreased anterior to posterior transit and weak lingual manipulation. Premature spillage with liquids, decreased hyolarygneal excursion and delayed closure of the laryngeal vestibule lead to moderate silent aspiration before the swallow with thin, nectar and honey liquids. Cued cough ineffective. Chin tuck provided full airway protection with honey thick liquids, and prevented aspiration but not penetration with nectar. Moderate retention of soft solid in the valleculae, with slow, incomplete clearance through the cervical esophagus, with backflow noted. Recommend dys 1, honey thick liquids with full supervision/cuing for chin tuck with liquids. Will follow up for tolerance, repeat instrumental study (FEES may give better perspective of glottal closure) when appropriate.  SLP Visit Diagnosis Dysphagia, oral phase (R13.11);Dysphagia, pharyngeal phase (R13.13);Dysphagia, pharyngoesophageal phase (R13.14) Attention and concentration deficit following -- Frontal lobe and executive function deficit following -- Impact on safety and function Moderate aspiration risk   CHL IP TREATMENT RECOMMENDATION  08/29/2017 Treatment Recommendations Therapy as outlined in treatment plan below;F/U FEES in --- days (Comment);F/U MBS in --- days (Comment)   Prognosis 08/29/2017 Prognosis for  Safe Diet Advancement Good Barriers to Reach Goals -- Barriers/Prognosis Comment -- CHL IP DIET RECOMMENDATION 08/29/2017 SLP Diet Recommendations Dysphagia 1 (Puree) solids;Honey thick liquids Liquid Administration via Cup;Spoon Medication Administration Crushed with puree Compensations Chin tuck;Slow rate;Small sips/bites;Minimize environmental distractions Postural Changes Remain semi-upright after after feeds/meals (Comment);Seated upright at 90 degrees   CHL IP OTHER RECOMMENDATIONS 08/29/2017 Recommended Consults -- Oral Care Recommendations Oral care BID Other Recommendations Remove water pitcher;Have oral suction available;Prohibited food (jello, ice cream, thin soups);Order thickener from pharmacy   CHL IP FOLLOW UP RECOMMENDATIONS 08/29/2017 Follow up Recommendations Inpatient Rehab   CHL IP FREQUENCY AND DURATION 08/29/2017 Speech Therapy Frequency (ACUTE ONLY) min 2x/week Treatment Duration 2 weeks      CHL IP ORAL PHASE 08/29/2017 Oral Phase Impaired Oral - Pudding Teaspoon -- Oral - Pudding Cup -- Oral - Honey Teaspoon Weak lingual manipulation;Reduced posterior propulsion;Delayed oral transit;Premature spillage Oral - Honey Cup Weak lingual manipulation;Reduced posterior propulsion;Delayed oral transit;Premature spillage Oral - Nectar Teaspoon Weak lingual manipulation;Reduced posterior propulsion;Delayed oral transit;Premature spillage Oral - Nectar Cup -- Oral - Nectar Straw -- Oral - Thin Teaspoon Weak lingual manipulation;Delayed oral transit;Premature spillage;Reduced posterior propulsion Oral - Thin Cup Weak lingual manipulation;Reduced posterior propulsion;Delayed oral transit;Premature spillage Oral - Thin Straw -- Oral - Puree Weak lingual manipulation;Reduced posterior propulsion;Delayed oral transit Oral - Mech Soft Impaired mastication;Weak lingual manipulation;Reduced posterior propulsion;Delayed oral transit;Lingual/palatal residue Oral - Regular -- Oral - Multi-Consistency -- Oral - Pill --  Oral Phase - Comment --  CHL IP PHARYNGEAL PHASE 08/29/2017 Pharyngeal Phase Impaired Pharyngeal- Pudding Teaspoon -- Pharyngeal -- Pharyngeal- Pudding Cup -- Pharyngeal -- Pharyngeal- Honey Teaspoon Delayed swallow initiation-pyriform sinuses;Reduced anterior laryngeal mobility;Reduced laryngeal elevation;Reduced airway/laryngeal closure;Penetration/Aspiration before swallow;Moderate aspiration;Compensatory strategies attempted (with notebox) Pharyngeal Material enters airway, passes BELOW cords without attempt by patient to eject out (silent aspiration) Pharyngeal- Honey Cup Delayed swallow initiation-pyriform sinuses;Reduced anterior laryngeal mobility;Reduced laryngeal elevation;Reduced airway/laryngeal closure;Penetration/Aspiration before swallow;Moderate aspiration;Compensatory strategies attempted (with notebox) Pharyngeal Material enters airway, passes BELOW cords without attempt by patient to eject out (silent aspiration) Pharyngeal- Nectar Teaspoon Delayed swallow initiation-pyriform sinuses;Reduced anterior laryngeal mobility;Reduced laryngeal elevation;Reduced airway/laryngeal closure;Penetration/Aspiration before swallow;Moderate aspiration;Compensatory strategies attempted (with notebox) Pharyngeal Material enters airway, passes BELOW cords without attempt by patient to eject out (silent aspiration) Pharyngeal- Nectar Cup -- Pharyngeal -- Pharyngeal- Nectar Straw -- Pharyngeal -- Pharyngeal- Thin Teaspoon Delayed swallow initiation-pyriform sinuses;Reduced anterior laryngeal mobility;Reduced laryngeal elevation;Reduced airway/laryngeal closure;Penetration/Aspiration before swallow;Moderate aspiration Pharyngeal Material enters airway, passes BELOW cords without attempt by patient to eject out (silent aspiration) Pharyngeal- Thin Cup Delayed swallow initiation-pyriform sinuses;Reduced anterior laryngeal mobility;Reduced laryngeal elevation;Reduced airway/laryngeal closure;Penetration/Aspiration before  swallow;Moderate aspiration Pharyngeal Material enters airway, passes BELOW cords without attempt by patient to eject out (silent aspiration) Pharyngeal- Thin Straw -- Pharyngeal -- Pharyngeal- Puree Delayed swallow initiation-vallecula;Reduced anterior laryngeal mobility;Reduced laryngeal elevation Pharyngeal -- Pharyngeal- Mechanical Soft Delayed swallow initiation-vallecula;Reduced anterior laryngeal mobility;Reduced laryngeal elevation;Reduced tongue base retraction;Pharyngeal residue - valleculae;Pharyngeal residue - cp segment Pharyngeal -- Pharyngeal- Regular -- Pharyngeal -- Pharyngeal- Multi-consistency -- Pharyngeal -- Pharyngeal- Pill -- Pharyngeal -- Pharyngeal Comment --  CHL IP CERVICAL ESOPHAGEAL PHASE 08/29/2017 Cervical Esophageal Phase Impaired Pudding Teaspoon -- Pudding Cup -- Honey Teaspoon Esophageal backflow into cervical esophagus;Reduced cricopharyngeal relaxation Honey Cup -- Nectar Teaspoon -- Nectar Cup -- Nectar Straw -- Thin Teaspoon -- Thin Cup -- Thin  Straw -- Puree -- Mechanical Soft Reduced cricopharyngeal relaxation;Esophageal backflow into cervical esophagus Regular -- Multi-consistency -- Pill -- Cervical Esophageal Comment -- Deneise Lever, MS, CCC-SLP Speech-Language Pathologist [phone] No flowsheet data found. Aliene Altes 08/29/2017, 3:16 PM               Assessment/Plan: Diagnosis: Debility Labs independently reviewed.  Records reviewed and summated above.  1. Does the need for close, 24 hr/day medical supervision in concert with the patient's rehab needs make it unreasonable for this patient to be served in a less intensive setting? Yes  2. Co-Morbidities requiring supervision/potential complications: ventricular tachycardia (monitor heart rate with increased physical activity), ischemic cardiomyopathy status post ICD, HTN (monitor and provide prns in accordance with increased physical exertion and pain), diabetes mellitus type 2 with retinopathy (Monitor in  accordance with exercise and adjust meds as necessary), CKD (avoid nephrotoxic meds) , chronic systolic congestive heart failure (Monitor in accordance with increased physical activity and avoid UE resistance excercises), CAD (continue meds), atrial flutter (monitor heart rate with increased physical activity), cardiogenic shock (wean pressors when appropriate), respiratory failure, V. tach/V. Fi (monitor heart rate), tachypnea (monitor RR and O2 Sats with increased physical exertion), leukocytosis (cont to monitor for signs and symptoms of infection, further workup if indicated), acute blood loss anemia (transfuse if necessary to ensure appropriate perfusion for increased activity tolerance) 3. Due to bladder management, safety, skin/wound care, disease management, medication administration, pain management and patient education, does the patient require 24 hr/day rehab nursing? Yes 4. Does the patient require coordinated care of a physician, rehab nurse, PT (1-2 hrs/day, 5 days/week), OT (1-2 hrs/day, 5 days/week) and SLP (1-2 hrs/day, 5 days/week) to address physical and functional deficits in the context of the above medical diagnosis(es)? Yes Addressing deficits in the following areas: balance, endurance, locomotion, strength, transferring, bowel/bladder control, bathing, dressing, feeding, toileting, cognition, speech, swallowing and psychosocial support 5. Can the patient actively participate in an intensive therapy program of at least 3 hrs of therapy per day at least 5 days per week? Potentially 6. The potential for patient to make measurable gains while on inpatient rehab is excellent 7. Anticipated functional outcomes upon discharge from inpatient rehab are min assist and mod assist  with PT, min assist and mod assist with OT, supervision with SLP. 8. Estimated rehab length of stay to reach the above functional goals is: 18-22 days. 9. Anticipated D/C setting: Home 10. Anticipated post D/C  treatments: HH therapy and Home excercise program 11. Overall Rehab/Functional Prognosis: good  RECOMMENDATIONS: This patient's condition is appropriate for continued rehabilitative care in the following setting: CIR when medically stable unable to tolerate 3 hours of therapy a day. Patient has agreed to participate in recommended program. Yes Note that insurance prior authorization may be required for reimbursement for recommended care.  Comment: Rehab Admissions Coordinator to follow up.  Delice Lesch, MD, Maxine Glenn 08/29/2017

## 2017-08-29 NOTE — Progress Notes (Signed)
  Speech Language Pathology Treatment: Dysphagia  Patient Details Name: [name] [name] MRN: [id number] DOB: February 11, 1951 Today's Date: 08/29/2017 Time: 6837-2902 SLP Time Calculation (min) (ACUTE ONLY): 15 min  Assessment / Plan / Recommendation Clinical Impression  Pt seen for follow-up for dysphagia. Pt's greeting to SLP aphonic. After oral care, administered diagnostic trials of ice chips, thin liquids. There are no overt signs of aspiration initially, pt's voice clear but with minimal phonation. There was a weak, delayed cough as trials progressed, and pt's respiratory rate increased to low 30s. With prolonged intubation, deconditioning, and weak voice/cough, I am concerned for silent aspiration. Recommend MBS; discussed with RN who will hold lunch tray pending MBS. Continue medications crushed in puree, and allow pt to have ice chips, teaspoons water after oral care. Will update recommendations after MBS this afternoon.     HPI HPI: 67 y.o. 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       SLP Plan  MBS;New goals to be determined pending instrumental study       Recommendations  Diet recommendations: Other(comment)(ice chips, teaspoons water after oral care) Liquids provided via: Teaspoon Medication Administration: Crushed with puree Supervision: Full supervision/cueing for compensatory strategies                Oral Care Recommendations: Oral care QID Follow up Recommendations: Inpatient Rehab SLP Visit Diagnosis: Dysphagia, oropharyngeal phase (R13.12) Plan: MBS;New goals to be determined pending instrumental study       [place], [place], [place] Pathologist [phone]  [name] [name] 08/29/2017, 9:51 AM

## 2017-08-30 ENCOUNTER — Inpatient Hospital Stay (HOSPITAL_COMMUNITY): Payer: PPO

## 2017-08-30 LAB — COOXEMETRY PANEL
Carboxyhemoglobin: 1.5 % (ref 0.5–1.5)
Methemoglobin: 1.7 % — ABNORMAL HIGH (ref 0.0–1.5)
O2 Saturation: 63.5 %
Total hemoglobin: 9.9 g/dL — ABNORMAL LOW (ref 12.0–16.0)

## 2017-08-30 LAB — COMPREHENSIVE METABOLIC PANEL
ALT: 93 U/L — AB (ref 0–44)
AST: 75 U/L — AB (ref 15–41)
Albumin: 2.4 g/dL — ABNORMAL LOW (ref 3.5–5.0)
Alkaline Phosphatase: 78 U/L (ref 38–126)
Anion gap: 10 (ref 5–15)
BUN: 45 mg/dL — AB (ref 8–23)
CHLORIDE: 102 mmol/L (ref 98–111)
CO2: 28 mmol/L (ref 22–32)
CREATININE: 1.39 mg/dL — AB (ref 0.61–1.24)
Calcium: 9.3 mg/dL (ref 8.9–10.3)
GFR calc Af Amer: 59 mL/min — ABNORMAL LOW (ref >60)
GFR calc non Af Amer: 51 mL/min — ABNORMAL LOW (ref >60)
Glucose, Bld: 117 mg/dL — ABNORMAL HIGH (ref 70–99)
Potassium: 4.1 mmol/L (ref 3.5–5.1)
SODIUM: 140 mmol/L (ref 135–145)
Total Bilirubin: 1.5 mg/dL — ABNORMAL HIGH (ref 0.3–1.2)
Total Protein: 7.5 g/dL (ref 6.5–8.1)

## 2017-08-30 LAB — GLUCOSE, CAPILLARY
GLUCOSE-CAPILLARY: 162 mg/dL — AB (ref 70–99)
GLUCOSE-CAPILLARY: 205 mg/dL — AB (ref 70–99)
GLUCOSE-CAPILLARY: 41 mg/dL — AB (ref 70–99)
GLUCOSE-CAPILLARY: 96 mg/dL (ref 70–99)
Glucose-Capillary: 101 mg/dL — ABNORMAL HIGH (ref 70–99)
Glucose-Capillary: 155 mg/dL — ABNORMAL HIGH (ref 70–99)
Glucose-Capillary: 142 mg/dL — ABNORMAL HIGH (ref 70–99)

## 2017-08-30 LAB — PROTIME-INR
INR: 2.79
Prothrombin Time: 29.2 seconds — ABNORMAL HIGH (ref 11.4–15.2)

## 2017-08-30 LAB — LACTATE DEHYDROGENASE: LDH: 306 U/L — ABNORMAL HIGH (ref 98–192)

## 2017-08-30 LAB — MAGNESIUM: Magnesium: 2.5 mg/dL — ABNORMAL HIGH (ref 1.7–2.4)

## 2017-08-30 LAB — BRAIN NATRIURETIC PEPTIDE: B Natriuretic Peptide: 114.6 pg/mL — ABNORMAL HIGH (ref 0.0–100.0)

## 2017-08-30 MED ORDER — CLONAZEPAM 0.25 MG PO TBDP
0.2500 mg | ORAL_TABLET | Freq: Two times a day (BID) | ORAL | Status: DC | PRN
  Administered 2017-08-31: 0.25 mg via ORAL
  Filled 2017-08-30 (×2): qty 1

## 2017-08-30 MED ORDER — WARFARIN 0.5 MG HALF TABLET
0.5000 mg | ORAL_TABLET | Freq: Once | ORAL | Status: AC
  Administered 2017-08-30: 0.5 mg via ORAL
  Filled 2017-08-30: qty 1

## 2017-08-30 MED ORDER — POTASSIUM CHLORIDE 20 MEQ PO PACK
40.0000 meq | PACK | Freq: Every day | ORAL | Status: DC
  Filled 2017-08-30: qty 2

## 2017-08-30 MED ORDER — OXYCODONE HCL 5 MG PO TABS
5.0000 mg | ORAL_TABLET | Freq: Four times a day (QID) | ORAL | Status: DC | PRN
Start: 2017-08-30 — End: 2017-09-10
  Administered 2017-08-31 – 2017-09-10 (×3): 5 mg via ORAL
  Filled 2017-08-30 (×3): qty 1

## 2017-08-30 NOTE — Progress Notes (Signed)
Physical Therapy Treatment Patient Details Name: [name] [name] MRN: [id number] DOB: [birth year] Today's Date: 08/30/2017    History of Present Illness 67 y.o. male admitted 6/12 for milrinone s/p LVAD HM3 placement 6/18 with return to OR same day for hematoma evacuation with delayed closure 7/40, course complicated by cardiogenic shock, biventricular failure, cardioversions, VDRF with extubation 6/28. PMhx: chronic systolic CHF due to ICM, [name], CAD, PAD, [name], HTN, HLD, [name]    PT Comments    Pt with flat affect, decreased alertness but able to recall prior session and able to assist with standing for increased time today. Pt requires lift for OOB for pt and therapist safety with nursing present and aware. Educated pt for sternal precautions but pt not yet ready for LVAD power transition education. Will continue to follow with pt encouraged to perform bil LE HEP and family present throughout session. Unable to obtain pulse ox reading throughout session with pt on 2L O2.     Follow Up Recommendations  CIR;Supervision/Assistance - 24 hour     Equipment Recommendations  Rolling walker with 5" wheels;3in1 (PT)    Recommendations for Other Services       Precautions / Restrictions Precautions Precautions: Sternal;Fall Precaution Comments: LVAD    Mobility  Bed Mobility Overal bed mobility: Needs Assistance Bed Mobility: Rolling;Sidelying to Sit Rolling: Max assist;+2 for physical assistance Sidelying to sit: Max assist;+2 for physical assistance       General bed mobility comments: physical assist to bend knees and roll bil for positioning, assist to elevate trunk and bring legs off of bed  Transfers Overall transfer level: Needs assistance   Transfers: Sit to/from Stand Sit to Stand: Total assist;+2 physical assistance         General transfer comment: bil knees blocked with assist of belt and pad for anterior translation and rise. Pt able to  tolerate bil LE weight bearing but increased time and difficulty to extend trunk with "bouncy"knees. Pt with initial stand with heavy right lean with total assist for initial stand grossly 20 sec. Repeat trial grossly 60 sec with transition from total to max assist. Transition from EOB to chair with maxisky +3 for lines and safety  Ambulation/Gait             General Gait Details: unable   Stairs             Wheelchair Mobility    Modified Rankin (Stroke Patients Only)       Balance Overall balance assessment: Needs assistance   Sitting balance-Leahy Scale: Poor Sitting balance - Comments: pt with flexed trunk and min -minguard assist EOB grossly 6 min with cues for midline, attention and safety     Standing balance-Leahy Scale: Zero Standing balance comment: max +2 assist in standing                            Cognition Arousal/Alertness: Lethargic Behavior During Therapy: Flat affect Overall Cognitive Status: Impaired/Different from baseline Area of Impairment: Orientation;Memory;Attention;Following commands;Safety/judgement;Problem solving                 Orientation Level: Time Current Attention Level: Sustained Memory: Decreased recall of precautions;Decreased short-term memory Following Commands: Follows one step commands inconsistently;Follows one step commands with increased time Safety/Judgement: Decreased awareness of safety;Decreased awareness of deficits   Problem Solving: Slow processing;Decreased initiation;Difficulty sequencing;Requires verbal cues;Requires tactile cues General Comments: pt with difficulty maintaining eyes open,  aware of surgery for pump, delayed response with difficulty correcting balance and posture      Exercises General Exercises - Lower Extremity Long Arc Quad: AROM;Seated;Both;10 reps    General Comments        Pertinent Vitals/Pain Pain Score: 4  Faces Pain Scale: Hurts little more Pain Location:  bil LE hamstrings with transition into long sitting, relieved with reduction of tension Pain Descriptors / Indicators: Tightness Pain Intervention(s): Repositioned    Home Living                      Prior Function            PT Goals (current goals can now be found in the care plan section) Progress towards PT goals: Progressing toward goals    Frequency    Min 3X/week      PT Plan Current plan remains appropriate    Co-evaluation PT/OT/SLP Co-Evaluation/Treatment: Yes Reason for Co-Treatment: Complexity of the patient's impairments (multi-system involvement);For patient/therapist safety PT goals addressed during session: Mobility/safety with mobility;Balance;Strengthening/ROM        AM-PAC PT "6 Clicks" Daily Activity  Outcome Measure  Difficulty turning over in bed (including adjusting bedclothes, sheets and blankets)?: Unable Difficulty moving from lying on back to sitting on the side of the bed? : Unable Difficulty sitting down on and standing up from a chair with arms (e.g., wheelchair, bedside commode, etc,.)?: Unable Help needed moving to and from a bed to chair (including a wheelchair)?: Total Help needed walking in hospital room?: Total Help needed climbing 3-5 steps with a railing? : Total 6 Click Score: 6    End of Session Equipment Utilized During Treatment: Gait belt;Oxygen Activity Tolerance: Patient tolerated treatment well Patient left: in chair;with call bell/phone within reach;with chair alarm set;with family/visitor present;with nursing/sitter in room Nurse Communication: Mobility status;Need for lift equipment;Precautions PT Visit Diagnosis: Unsteadiness on feet (R26.81);Other abnormalities of gait and mobility (R26.89);Muscle weakness (generalized) (M62.81)     Time: 3704-8889 PT Time Calculation (min) (ACUTE ONLY): 33 min  Charges:  $Therapeutic Activity: 8-22 mins                    G Codes:       Elwyn Reach,  PT [phone]    Tryone Kille B Zendayah Hardgrave 08/30/2017, 10:32 AM

## 2017-08-30 NOTE — Progress Notes (Signed)
SLP Cancellation Note  Patient Details Name: [name] [name] MRN: [id number] DOB: [birth year]   Cancelled treatment:        Planned to follow up from [facility] yesterday however pt has to lie flat following removal of cental line. Will continue to follow.    Houston Siren 08/30/2017, 3:32 PM Orbie Pyo Colvin Caroli.[employer] [phone]

## 2017-08-30 NOTE — Progress Notes (Signed)
Nutrition Follow-up  DOCUMENTATION CODES:   Obesity unspecified  INTERVENTION:  -.Magic cup TID with meals, each supplement provides 290 kcal and 9 grams of protein  -Snacks between meals   NUTRITION DIAGNOSIS:   Inadequate oral intake related to inability to eat as evidenced by NPO status.  Being addressed as diet advanced post extubation, supplements, snacks  GOAL:   Patient will meet greater than or equal to 90% of their needs  Progressing  MONITOR:   PO intake, Supplement acceptance, Labs, Weight trends  REASON FOR ASSESSMENT:   Consult LVAD Eval  ASSESSMENT:   67 yo male admitted with acute on chronic CHF, pt being worked up for destination LVAD. Pt with hx of CHF due to ICM, CAD s/p PCI, PAD, DM, HTN, HL, CKD II/III  6/12 Admitted 6/17 LVAD, Intubated 6/18 Mediastinal re-exploration with evacuation of hematoma 6/20 Evacuation of R-sided pericardia hematoma 6/21 TF initiated 6/27 Extubated 6/20 MBS yesterday, Dysphagia I, Honey thick diet  Off levophed, weaning epinephrine  Limited documented of po intake. Recorded intake of 25% of lunch meal on 6/29, no other intake recorded since extubated  Weight trending down; current wt below admission weight of 249 pounds. Current wt 229 pounds. Per weight encounters, pt weighed between 216-223 pounds since March  Labs: reviewed Meds: reglan   Diet Order:   Diet Order           DIET - DYS 1 Room service appropriate? Yes; Fluid consistency: Honey Thick  Diet effective now          EDUCATION NEEDS:   Education needs have been addressed  Skin:  Skin Assessment: Skin Integrity Issues: Skin Integrity Issues:: DTI, Other (Comment) DTI: buttock Other: non-pressure wound on buttock, quarter-size wound on back  Last BM:  6/29  Height:   Ht Readings from Last 1 Encounters:  08/27/17 5\' 11"  (1.803 m)    Weight:   Wt Readings from Last 1 Encounters:  08/30/17 229 lb 8 oz (104.1 kg)    Ideal Body  Weight:     BMI:  Body mass index is 32.01 kg/m.  Estimated Nutritional Needs:   Kcal:  2000-2280  Protein:  100-120 g  Fluid:  >/= 1.8 L   Kerman Passey MS, RD, LDN, CNSC [phone] Pager  [phone] Weekend/On-Call Pager

## 2017-08-30 NOTE — Progress Notes (Signed)
CSW visited bedside although patient sleeping at the time of visit. CSW met with daughter in the waiting room who shared positive reports about patient's recovery. Family all pleased with patient's improvement. CSW will continue to follow throughout implant hospitalization. Raquel Sarna, [place], [place]

## 2017-08-30 NOTE — Progress Notes (Addendum)
Patient ID: NEEMA FLUEGGE, male   DOB: [birth year], 67 y.o.   MRN: [id number]     Advanced Heart Failure Rounding Note  PCP-Cardiologist: No primary care provider on file.   Subjective:    Events 6/17 Underwent HM-3 implant -> Unable to close chest with RV failure and high intrathoracic pressure 6/18 Taken back to OR for evacuation of hematoma. NO RVAD needed. Left sternum open  08/19/17 Taken back to OR for sternal closure 6/22 VT => amiodarone started.  6/24 VT/VF=>urgent cardioversion. 6/25 VT  =>urgent cardioversion   Coox 63.5% on milrinone 0.375, and Epi 1. All tubes out.   CVP ~10.   Awake and alert to person, but remains intermittently lethargic.  Stopping sedative meds.   LVAD Interrogation HM 3: Speed: 5200 Flow: 3.8 PI: 2.8 Power: 4.0. VAD interrogated personally. Parameters stable.   Objective:   Weight Range: 229 lb 8 oz (104.1 kg) Body mass index is 32.01 kg/m.   Vital Signs:   Temp:  [96.1 F (35.6 C)-97.7 F (36.5 C)] 97.2 F (36.2 C) (07/01 0800) Pulse Rate:  [35-145] 63 (07/01 0800) Resp:  [19-35] 23 (07/01 0800) BP: (59-127)/(27-97) 86/65 (07/01 0800) SpO2:  [86 %-100 %] 97 % (07/01 0820) FiO2 (%):  [40 %] 40 % (06/30 1957) Weight:  [229 lb 8 oz (104.1 kg)] 229 lb 8 oz (104.1 kg) (07/01 0435) Last BM Date: 09/27/17  Weight change: Filed Weights   08/28/17 0449 08/29/17 0426 08/30/17 0435  Weight: 227 lb 8.2 oz (103.2 kg) 225 lb 1.4 oz (102.1 kg) 229 lb 8 oz (104.1 kg)   Intake/Output:   Intake/Output Summary (Last 24 hours) at 08/30/2017 0916 Last data filed at 08/30/2017 0900 Gross per 24 hour  Intake 1673.07 ml  Output 2250 ml  Net -576.93 ml   Physical Exam   MAPs 70-80s  General: NAD  HEENT: Normal. Neck: Supple, JVP 7-8 cm. Carotids OK.  Cardiac:  Mechanical heart sounds with LVAD hum present.  Lungs:  CTAB, normal effort.  Abdomen:  NT, ND, no HSM. No bruits or masses. +BS  LVAD exit site: Dressing dry and intact. No erythema  or drainage.  Extremities:  Warm and dry. No cyanosis, clubbing, rash, or edema.  Neuro:  Alert to person.  Cranial nerves grossly intact. Moves all 4 extremities w/o difficulty. Affect pleasant     Telemetry   A fib 80s with PVCs, personally reviewed.   Labs    CBC Recent Labs    08/28/17 0359  WBC 15.9*  HGB 9.4*  HCT 30.4*  MCV 86.4  PLT 850*   Basic Metabolic Panel Recent Labs    08/28/17 0359  08/29/17 0400 08/30/17 0340  NA 132*   < > 137 140  K NOT DONE   < > 4.1 4.1  CL 89*   < > 94* 102  CO2 32   < > 30 28  GLUCOSE 169*   < > 163* 117*  BUN 60*   < > 58* 45*  CREATININE 1.48*   < > 1.67* 1.39*  CALCIUM 9.0   < > 9.2 9.3  MG 2.4  --  2.6* 2.5*  PHOS 4.0  --   --   --    < > = values in this interval not displayed.   Liver Function Tests Recent Labs    08/29/17 0400 08/30/17 0340  AST 133* 75*  ALT 130* 93*  ALKPHOS 83 78  BILITOT 1.8* 1.5*  PROT 7.2 7.5  ALBUMIN 2.3* 2.4*   No results for input(s): LIPASE, AMYLASE in the last 72 hours. Cardiac Enzymes No results for input(s): CKTOTAL, CKMB, CKMBINDEX, TROPONINI in the last 72 hours.  BNP: BNP (last 3 results) Recent Labs    08/17/17 0314 08/23/17 0024 08/30/17 0000  BNP 266.4* 450.5* 114.6*    ProBNP (last 3 results) Recent Labs    11/12/16 1559  PROBNP 388.0*     D-Dimer No results for input(s): DDIMER in the last 72 hours. Hemoglobin A1C No results for input(s): HGBA1C in the last 72 hours. Fasting Lipid Panel No results for input(s): CHOL, HDL, LDLCALC, TRIG, CHOLHDL, LDLDIRECT in the last 72 hours. Thyroid Function Tests No results for input(s): TSH, T4TOTAL, T3FREE, THYROIDAB in the last 72 hours.  Invalid input(s): FREET3  Other results:   Imaging    Dg Chest Port 1 View  Result Date: 08/30/2017 CLINICAL DATA:  Shortness of Breath EXAM: PORTABLE CHEST 1 VIEW COMPARISON:  August 29, 2017 FINDINGS: Central catheter tip is in the superior vena cava. Pacemaker leads are  attached the right atrium, right ventricle, and coronary sinus. There is a left ventricular assist device present. There is a tricuspid valve replacement. No pneumothorax. There is patchy airspace consolidation throughout much of the right lung, particular in the right upper lobe as well as in the left base region. There is a small left pleural effusion. Heart overall is mildly enlarged with pulmonary vascularity normal. No adenopathy. There are surgical clips in the left neck region. IMPRESSION: Areas of patchy airspace opacity concerning for multifocal pneumonia. A degree of pulmonary edema may also present in this manner. Both entities may be present concurrently. The appearance is stable compared to 1 day prior. Stable cardiac silhouette.  Postoperative changes noted. Electronically Signed   By: Lowella Grip III M.D.   On: 08/30/2017 07:38   Dg Swallowing Func-speech Pathology  Result Date: 08/29/2017 Objective Swallowing Evaluation: Type of Study: MBS-Modified Barium Swallow Study  Patient Details Name: HAI GRABE MRN: [id number] Date of Birth: 1950-06-03 Today's Date: 08/29/2017 Time: SLP Start Time (ACUTE ONLY): 1325 -SLP Stop Time (ACUTE ONLY): 1350 SLP Time Calculation (min) (ACUTE ONLY): 25 min Past Medical History: Past Medical History: Diagnosis Date . AICD (automatic cardioverter/defibrillator) present 02/05/2014  Upgrade to Medtronic biventricular ICD, serial number  [id number]  . Atrial flutter ([place]) 04/2012  s/p TEE-EPS+RFCA 04/2012 . CAD (coronary artery disease) 5701,7793 X 2   RCA-T, 70% PL (off CFX), 99% Prox LAD/90% Dist LAD, S/P TAXUS stent x 2 . CHF (congestive heart failure) ([place])  . Chronic anticoagulation  . Chronic systolic heart failure ([place])  . CKD (chronic kidney disease)  . Diabetic retinopathy ([place])  . DM type 2 (diabetes mellitus, type 2) ([facility])   insulin dependent . HTN (hypertension)  . Hypercholesteremia   ablation . ICD (implantable cardiac defibrillator) in place  .  Ischemic cardiomyopathy March 2015  20-25% 2D  . Nephrolithiasis  . Ventricular tachycardia [facility])  Past Surgical History: Past Surgical History: Procedure Laterality Date . ATRIAL FLUTTER ABLATION N/A 05/19/2012  Procedure: ATRIAL FLUTTER ABLATION;  Surgeon: Thompson Grayer, MD;  Location: [facility] CATH LAB;  Service: Cardiovascular;  Laterality: N/A; . BI-VENTRICULAR IMPLANTABLE CARDIOVERTER DEFIBRILLATOR UPGRADE N/A 02/05/2014  Procedure: BI-VENTRICULAR IMPLANTABLE CARDIOVERTER DEFIBRILLATOR UPGRADE;  Surgeon: Evans Lance, MD;  Location: [facility] CATH LAB;  Service: Cardiovascular;  Laterality: N/A; . BIV ICD GENERTAOR CHANGE OUT  02/05/2014  Upgrade to Medtronic biventricular ICD, serial number  [id number] by  Dr. [name] . CARDIAC DEFIBRILLATOR PLACEMENT  2007   Medtronic Maximo VR, serial number [id number] . INSERTION OF IMPLANTABLE LEFT VENTRICULAR ASSIST DEVICE N/A 08/16/2017  Procedure: INSERTION OF IMPLANTABLE LEFT VENTRICULAR ASSIST DEVICE-HM3;  Surgeon: Ivin Poot, MD;  Location: [place];  Service: Open Heart Surgery;  Laterality: N/A; . IR FLUORO GUIDE CV LINE RIGHT  08/12/2017 . IR US GUIDE VASC ACCESS RIGHT  08/12/2017 . MEDIASTINAL EXPLORATION  08/17/2017  Procedure: MEDIASTINAL REXPLORATION with evacuation of hematoma;  Surgeon: Prescott Gum, Collier Salina, MD;  Location: [facility] OR;  Service: Open Heart Surgery;; . PERCUTANEOUS CORONARY STENT INTERVENTION (PCI-S)  January 2002  PTCA/Stent Distal RCA . PERCUTANEOUS CORONARY STENT INTERVENTION (PCI-S)  June 2002  PTCA/Stent x 3 RCA, thrombolysis - failed . PERCUTANEOUS CORONARY STENT INTERVENTION (PCI-S)  July 2006  TAXUS stents to prox and distal LAD . RIGHT HEART CATH N/A 07/22/2017  Procedure: RIGHT HEART CATH;  Surgeon: Jolaine Artist, MD;  Location: [place] CV LAB;  Service: Cardiovascular;  Laterality: N/A; . RIGHT HEART CATH N/A 08/13/2017  Procedure: RIGHT HEART CATH - swan;  Surgeon: Jolaine Artist, MD;  Location: [place] CV LAB;  Service: Cardiovascular;   Laterality: N/A; . RIGHT/LEFT HEART CATH AND CORONARY ANGIOGRAPHY N/A 02/18/2017  Procedure: RIGHT/LEFT HEART CATH AND CORONARY ANGIOGRAPHY;  Surgeon: Jolaine Artist, MD;  Location: [place] CV LAB;  Service: Cardiovascular;  Laterality: N/A; . STERNAL CLOSURE N/A 08/19/2017  Procedure: STERNAL CLOSURE;  Surgeon: Ivin Poot, MD;  Location: [place];  Service: Thoracic;  Laterality: N/A; . TEE WITHOUT CARDIOVERSION N/A 08/16/2017  Procedure: TRANSESOPHAGEAL ECHOCARDIOGRAM (TEE);  Surgeon: Prescott Gum, Collier Salina, MD;  Location: [place];  Service: Open Heart Surgery;  Laterality: N/A; . TEE WITHOUT CARDIOVERSION N/A 08/19/2017  Procedure: TRANSESOPHAGEAL ECHOCARDIOGRAM (TEE);  Surgeon: Prescott Gum, Collier Salina, MD;  Location: [place];  Service: Thoracic;  Laterality: N/A; . TRICUSPID VALVE REPLACEMENT N/A 08/16/2017  Procedure: TRICUSPID VALVE REPAIR using Oletta Lamas MC3 Ring size 30;  Surgeon: Ivin Poot, MD;  Location: [place];  Service: Open Heart Surgery;  Laterality: N/A; HPI: 67 y.o. 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  Subjective: Pt arrives with RN Assessment / Plan / Recommendation CHL IP CLINICAL IMPRESSIONS 08/29/2017 Clinical Impression Patient presents with moderate oral and severe pharyngeal dysphagia with gross, silent aspiration of thin, nectar and honey thick liquids. Suspect pt's dysphagia is at least in part acute, reversible with additional time post-extubation and with general strengthening. Oral stage is characterized by prolonged oral manipulation, decreased anterior to posterior transit and weak lingual manipulation. Premature spillage with liquids, decreased hyolarygneal excursion and delayed closure of the laryngeal vestibule lead to moderate silent aspiration before the swallow with thin, nectar and honey liquids. Cued cough ineffective. Chin tuck provided full  airway protection with honey thick liquids, and prevented aspiration but not penetration with nectar. Moderate retention of soft solid in the valleculae, with slow, incomplete clearance through the cervical esophagus, with backflow noted. Recommend dys 1, honey thick liquids with full supervision/cuing for chin tuck with liquids. Will follow up for tolerance, repeat instrumental study (FEES may give better perspective of glottal closure) when appropriate.  SLP Visit Diagnosis Dysphagia, oral phase (R13.11);Dysphagia, pharyngeal phase (R13.13);Dysphagia, pharyngoesophageal phase (R13.14) Attention and concentration deficit following -- Frontal lobe and executive function deficit following -- Impact on  safety and function Moderate aspiration risk   CHL IP TREATMENT RECOMMENDATION 08/29/2017 Treatment Recommendations Therapy as outlined in treatment plan below;F/U FEES in --- days (Comment);F/U MBS in --- days (Comment)   Prognosis 08/29/2017 Prognosis for Safe Diet Advancement Good Barriers to Reach Goals -- Barriers/Prognosis Comment -- CHL IP DIET RECOMMENDATION 08/29/2017 SLP Diet Recommendations Dysphagia 1 (Puree) solids;Honey thick liquids Liquid Administration via Cup;Spoon Medication Administration Crushed with puree Compensations Chin tuck;Slow rate;Small sips/bites;Minimize environmental distractions Postural Changes Remain semi-upright after after feeds/meals (Comment);Seated upright at 90 degrees   CHL IP OTHER RECOMMENDATIONS 08/29/2017 Recommended Consults -- Oral Care Recommendations Oral care BID Other Recommendations Remove water pitcher;Have oral suction available;Prohibited food (jello, ice cream, thin soups);Order thickener from pharmacy   CHL IP FOLLOW UP RECOMMENDATIONS 08/29/2017 Follow up Recommendations Inpatient Rehab   CHL IP FREQUENCY AND DURATION 08/29/2017 Speech Therapy Frequency (ACUTE ONLY) min 2x/week Treatment Duration 2 weeks      CHL IP ORAL PHASE 08/29/2017 Oral Phase Impaired Oral -  Pudding Teaspoon -- Oral - Pudding Cup -- Oral - Honey Teaspoon Weak lingual manipulation;Reduced posterior propulsion;Delayed oral transit;Premature spillage Oral - Honey Cup Weak lingual manipulation;Reduced posterior propulsion;Delayed oral transit;Premature spillage Oral - Nectar Teaspoon Weak lingual manipulation;Reduced posterior propulsion;Delayed oral transit;Premature spillage Oral - Nectar Cup -- Oral - Nectar Straw -- Oral - Thin Teaspoon Weak lingual manipulation;Delayed oral transit;Premature spillage;Reduced posterior propulsion Oral - Thin Cup Weak lingual manipulation;Reduced posterior propulsion;Delayed oral transit;Premature spillage Oral - Thin Straw -- Oral - Puree Weak lingual manipulation;Reduced posterior propulsion;Delayed oral transit Oral - Mech Soft Impaired mastication;Weak lingual manipulation;Reduced posterior propulsion;Delayed oral transit;Lingual/palatal residue Oral - Regular -- Oral - Multi-Consistency -- Oral - Pill -- Oral Phase - Comment --  CHL IP PHARYNGEAL PHASE 08/29/2017 Pharyngeal Phase Impaired Pharyngeal- Pudding Teaspoon -- Pharyngeal -- Pharyngeal- Pudding Cup -- Pharyngeal -- Pharyngeal- Honey Teaspoon Delayed swallow initiation-pyriform sinuses;Reduced anterior laryngeal mobility;Reduced laryngeal elevation;Reduced airway/laryngeal closure;Penetration/Aspiration before swallow;Moderate aspiration;Compensatory strategies attempted (with notebox) Pharyngeal Material enters airway, passes BELOW cords without attempt by patient to eject out (silent aspiration) Pharyngeal- Honey Cup Delayed swallow initiation-pyriform sinuses;Reduced anterior laryngeal mobility;Reduced laryngeal elevation;Reduced airway/laryngeal closure;Penetration/Aspiration before swallow;Moderate aspiration;Compensatory strategies attempted (with notebox) Pharyngeal Material enters airway, passes BELOW cords without attempt by patient to eject out (silent aspiration) Pharyngeal- Nectar Teaspoon Delayed  swallow initiation-pyriform sinuses;Reduced anterior laryngeal mobility;Reduced laryngeal elevation;Reduced airway/laryngeal closure;Penetration/Aspiration before swallow;Moderate aspiration;Compensatory strategies attempted (with notebox) Pharyngeal Material enters airway, passes BELOW cords without attempt by patient to eject out (silent aspiration) Pharyngeal- Nectar Cup -- Pharyngeal -- Pharyngeal- Nectar Straw -- Pharyngeal -- Pharyngeal- Thin Teaspoon Delayed swallow initiation-pyriform sinuses;Reduced anterior laryngeal mobility;Reduced laryngeal elevation;Reduced airway/laryngeal closure;Penetration/Aspiration before swallow;Moderate aspiration Pharyngeal Material enters airway, passes BELOW cords without attempt by patient to eject out (silent aspiration) Pharyngeal- Thin Cup Delayed swallow initiation-pyriform sinuses;Reduced anterior laryngeal mobility;Reduced laryngeal elevation;Reduced airway/laryngeal closure;Penetration/Aspiration before swallow;Moderate aspiration Pharyngeal Material enters airway, passes BELOW cords without attempt by patient to eject out (silent aspiration) Pharyngeal- Thin Straw -- Pharyngeal -- Pharyngeal- Puree Delayed swallow initiation-vallecula;Reduced anterior laryngeal mobility;Reduced laryngeal elevation Pharyngeal -- Pharyngeal- Mechanical Soft Delayed swallow initiation-vallecula;Reduced anterior laryngeal mobility;Reduced laryngeal elevation;Reduced tongue base retraction;Pharyngeal residue - valleculae;Pharyngeal residue - cp segment Pharyngeal -- Pharyngeal- Regular -- Pharyngeal -- Pharyngeal- Multi-consistency -- Pharyngeal -- Pharyngeal- Pill -- Pharyngeal -- Pharyngeal Comment --  CHL IP CERVICAL ESOPHAGEAL PHASE 08/29/2017 Cervical Esophageal Phase Impaired Pudding Teaspoon -- Pudding Cup -- Honey Teaspoon Esophageal backflow into cervical esophagus;Reduced cricopharyngeal relaxation Honey Cup -- Nectar Teaspoon -- Nectar Cup --  Nectar Straw -- Thin Teaspoon --  Thin Cup -- Thin Straw -- Puree -- Mechanical Soft Reduced cricopharyngeal relaxation;Esophageal backflow into cervical esophagus Regular -- Multi-consistency -- Pill -- Cervical Esophageal Comment -- Deneise Lever, MS, CCC-SLP Speech-Language Pathologist [phone] No flowsheet data found. Aliene Altes 08/29/2017, 3:16 PM                Medications:     Scheduled Medications: . amiodarone  200 mg Oral BID  . aspirin  81 mg Oral Daily  . bisacodyl  10 mg Oral Daily   Or  . bisacodyl  10 mg Rectal Daily  . Chlorhexidine Gluconate Cloth  6 each Topical Daily  . clonazepam  1 mg Oral BID  . collagenase   Topical Daily  . insulin aspart  0-24 Units Subcutaneous Q4H  . insulin detemir  30 Units Subcutaneous BID  . levalbuterol  0.63 mg Nebulization TID  . magnesium oxide  400 mg Oral BID  . mouth rinse  15 mL Mouth Rinse BID  . metoCLOPramide (REGLAN) injection  10 mg Intravenous Q6H  . potassium chloride  40 mEq Oral BID  . QUEtiapine  25 mg Oral BID  . sodium chloride flush  10-40 mL Intracatheter Q12H  . spironolactone  25 mg Oral BID  . Warfarin - Pharmacist Dosing Inpatient   Does not apply q1800    Infusions: . sodium chloride 10 mL/hr at 08/30/17 0900  . EPINEPHrine 4 mg in dextrose 5% 250 mL infusion (16 mcg/mL) 1 mcg/min (08/30/17 0900)  . famotidine (PEPCID) IV 100 mL/hr at 08/29/17 2300  . lactated ringers Stopped (08/19/17 1602)  . meropenem (MERREM) IV Stopped (08/30/17 0813)  . milrinone 0.375 mcg/kg/min (08/30/17 0900)  . vancomycin Stopped (08/30/17 2703)    PRN Medications: acetaminophen (TYLENOL) oral liquid 160 mg/5 mL, hydrALAZINE, lactated ringers, ondansetron (ZOFRAN) IV, oxyCODONE, RESOURCE THICKENUP CLEAR, sodium chloride flush    Patient Profile  RYLIE KNIERIM is a 67 y.o. 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     Directly admitted with persistent low cardiac output for milrinone initiation for home.  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 Acute/Chronic systolic CHF with biventricular failure-> cardiogenic shock: Echo  08/13/2017 EF 20-25%. s/p  Medtronic BiV ICD in place. Cath 12/18 with stable 1v CAD.  s/p HM-3 implant 6/17. Unable to close chest due to high-intrathoracic pressures and RV failure. Taken back to OR 08/17/17 with evacuation of hematoma with improvement.   -  Extubated 6/28  - Coox 63.5% on milrinone 0.375 mcg/kg/min and Epi 1. Off NE.  - Weight down 45 pounds in the past week. Now well below pre-op. CVP 10. Lasix held.  - CXR improving - continue flutter valve and IS - Need to mobilize 2. VAD - s/p HM-3 implant 6/17  - VAD interrogated personally. Parameters stable.  - INR 2.79. Discussed dosing with PharmD personally.  - Continue asa 81 mg daily.  - LDH 274>324 >384>425> 407> 423> 397> 269 > 306 3. Acute respiratory failure post-op - Discussion as above. Stable.  4. Acute on CKD II-III:  -Creatinine 1.39 with lasix held yesterday.  5. [name]/VF: Recurrent VT on 6/22 -S/P VF 6/24 emergent bedside cardioversion.  -S/P VT 6/26 emergent bedside cardioversion.  - No VT over night. Continue amiodarone  gtt.  - Keep K > 4.0 Mg > 2.0.  6. AFL - S/p previous ablation. Rate controlled Well-tolerated 7. [name]: Recent cath with stable CAD as above - No change to current plan.   8. [name]: Recent A1c 8.3 on 6/3.  - Cover with SSI in house. [place].  9. Anemia:  - Post-op.  Got 1 unit PRBCs 6/22 and 6/25. - Hgb 9.4. 10. Fevers  - Afebrile. Cx negative.  - On meropenem will continue for now  11. Deconditioning - Severe. Need to mobilize with PT. CIR consult placed.  12. Dysphagia - Progressing. Now on Honey/Thick.  13. Lethargy/delirium - Suspect from seroquel and sedatives. Will take off all scheduled sedation and change to prn only.   Length of Stay:  [place], [place]  08/30/2017, 9:16 AM  Advanced Heart Failure Team Pager [phone] (M-F; 7a - 4p)  Please contact [facility] for night-coverage after hours (4p -7a ) and weekends on [email or web address]    Patient seen and examined with the above-signed Advanced Practice Provider and/or Housestaff. I personally reviewed laboratory data, imaging studies and relevant notes. I independently examined the patient and formulated the important aspects of the plan. I have edited the note to reflect any of my changes or salient points. I have personally discussed the plan with the patient and/or family.  Sitting up in chair. Groggy but communicative. Remains on milrinone 0.375. Epi 1. Volume status looks good CVP 10. MAPs ok. Weaning epi as tolerated. Rhythm stable. VAD interrogated personally. Parameters stable. INR 2.79. Hgb stable. Discussed dosing with PharmD personally.  Will stop klonopin and seroquel. Suspect this will great help with his lethargy. Can stop abx after today. Continue to mobilize as mental status permits.   Glori Bickers, MD  2:43 PM

## 2017-08-30 NOTE — Plan of Care (Signed)
  Problem: Education: Goal: Knowledge of General Education information will improve Outcome: Progressing   Problem: Health Behavior/Discharge Planning: Goal: Ability to manage health-related needs will improve Outcome: Progressing   Problem: Clinical Measurements: Goal: Ability to maintain clinical measurements within normal limits will improve Outcome: Progressing Goal: Will remain free from infection Outcome: Progressing Goal: Diagnostic test results will improve Outcome: Progressing Goal: Respiratory complications will improve Outcome: Progressing Goal: Cardiovascular complication will be avoided Outcome: Progressing   Problem: Activity: Goal: Risk for activity intolerance will decrease Outcome: Progressing   Problem: Nutrition: Goal: Adequate nutrition will be maintained Outcome: Progressing   Problem: Coping: Goal: Level of anxiety will decrease Outcome: Progressing   Problem: Elimination: Goal: Will not experience complications related to bowel motility Outcome: Progressing Goal: Will not experience complications related to urinary retention Outcome: Progressing   Problem: Pain Managment: Goal: General experience of comfort will improve Outcome: Progressing   Problem: Safety: Goal: Ability to remain free from injury will improve Outcome: Progressing   Problem: Skin Integrity: Goal: Risk for impaired skin integrity will decrease Outcome: Progressing   Problem: Spiritual Needs Goal: Ability to function at adequate level Outcome: Progressing   Problem: Education: Goal: Ability to demonstrate management of disease process will improve Outcome: Progressing Goal: Ability to verbalize understanding of medication therapies will improve Outcome: Progressing   Problem: Activity: Goal: Capacity to carry out activities will improve Outcome: Progressing   Problem: Cardiac: Goal: Ability to achieve and maintain adequate cardiopulmonary perfusion will  improve Outcome: Progressing

## 2017-08-30 NOTE — Progress Notes (Signed)
Hildred Alamin, RN aware of order to discontinue central line and will perform this.  Aware that order is being completed.  Carolee Rota, RN VAST

## 2017-08-30 NOTE — Progress Notes (Signed)
[place] for coumadin Indication: LVAD  Allergies  Allergen Reactions  . Penicillins Hives, Itching and Other (See Comments)    Has patient had a PCN reaction causing immediate rash, facial/tongue/throat swelling, SOB or lightheadedness with hypotension:# # Yes # # Has patient had a PCN reaction causing severe rash involving mucus membranes or skin necrosis: No Has patient had a PCN reaction that required hospitalization: No Has patient had a PCN reaction occurring within the last 10 years: No If all of the above answers are "NO", then may proceed with Cephalosporin use.     Patient Measurements: Height: 5\' 11"  (180.3 cm) Weight: 229 lb 8 oz (104.1 kg) IBW/kg (Calculated) : 75.3 Heparin Dosing Weight: 98.3 kg  Vital Signs: Temp: 97.2 F (36.2 C) (07/01 0800) Temp Source: Bladder (07/01 0720) BP: 86/65 (07/01 0800) Pulse Rate: 63 (07/01 0800)  Labs: Recent Labs    08/28/17 0359 08/28/17 0740 08/29/17 0400 08/30/17 0340  HGB 9.4*  --   --   --   HCT 30.4*  --   --   --   PLT 411*  --   --   --   LABPROT 27.9*  --  29.8* 29.2*  INR 2.63  --  2.87 2.79  CREATININE 1.48* 1.47* 1.67* 1.39*    Estimated Creatinine Clearance: 63.3 mL/min (A) (by C-G formula based on SCr of 1.39 mg/dL (H)).   Medical History: Past Medical History:  Diagnosis Date  . AICD (automatic cardioverter/defibrillator) present 02/05/2014   Upgrade to Medtronic biventricular ICD, serial number  [id number]   . Atrial flutter ([place]) 04/2012   s/p TEE-EPS+RFCA 04/2012  . CAD (coronary artery disease) 1275,1700 X 2    RCA-T, 70% PL (off CFX), 99% Prox LAD/90% Dist LAD, S/P TAXUS stent x 2  . CHF (congestive heart failure) ([place])   . Chronic anticoagulation   . Chronic systolic heart failure ([place])   . CKD (chronic kidney disease)   . Diabetic retinopathy ([place])   . DM type 2 (diabetes mellitus, type 2) (HCC)    insulin dependent  . HTN (hypertension)   .  Hypercholesteremia    ablation  . ICD (implantable cardiac defibrillator) in place   . Ischemic cardiomyopathy March 2015   20-25% 2D   . Nephrolithiasis   . Ventricular tachycardia (HCC)     Medications:  Scheduled:  . amiodarone  200 mg Oral BID  . aspirin  81 mg Oral Daily  . bisacodyl  10 mg Oral Daily   Or  . bisacodyl  10 mg Rectal Daily  . Chlorhexidine Gluconate Cloth  6 each Topical Daily  . clonazepam  1 mg Oral BID  . collagenase   Topical Daily  . insulin aspart  0-24 Units Subcutaneous Q4H  . insulin detemir  30 Units Subcutaneous BID  . levalbuterol  0.63 mg Nebulization TID  . magnesium oxide  400 mg Oral BID  . mouth rinse  15 mL Mouth Rinse BID  . metoCLOPramide (REGLAN) injection  10 mg Intravenous Q6H  . potassium chloride  40 mEq Oral BID  . QUEtiapine  25 mg Oral BID  . sodium chloride flush  10-40 mL Intracatheter Q12H  . spironolactone  25 mg Oral BID  . Warfarin - Pharmacist Dosing Inpatient   Does not apply q1800    Assessment: 38 yom who underwent LVAD placement on 6/17  S/p Heparin drip stopped with INR > 2. INR remains above goal at 2.79. CBC  stable on last check. LDH stable. No s/sx of bleeding. On concurrent amiodarone, which can impact INR sensitivity.  LFTs/Tbili elevated but continues to trend down.   Goal of Therapy:  INR goal 2-2.5 Monitor platelets by anticoagulation protocol: Yes   Plan:   Warfarin 0.5 mg x1  Monitor s/s bleeding  Doylene Canard, PharmD Clinical Pharmacist  Pager: [phone] Phone: [phone] 08/30/2017 9:41 AM

## 2017-08-30 NOTE — Progress Notes (Signed)
Occupational Therapy Treatment Patient Details Name: ELERY CADENHEAD MRN: [id number] DOB: August 27, 1950 Today's Date: 08/30/2017    History of present illness 67 y.o. male admitted 6/12 for milrinone s/p LVAD HM3 placement 6/18 with return to OR same day for hematoma evacuation with delayed closure 4/09, course complicated by cardiogenic shock, biventricular failure, cardioversions, VDRF with extubation 6/28. PMhx: chronic systolic CHF due to ICM, [name], CAD, PAD, [name], HTN, HLD, [name]   OT comments  Pt seen in conjunction with PT.  He moved to EOB sitting with max A +2 and stood x 2 with total A +2 ofr 5-10 seconds and ~60seconds.   He was able to briefly progress to max A +2 for standing.  He was transferred to chair with use of Maxi skye.  Pt instructed in sternal precautions, but requires max cues to adhere to them.   He was very lethargic during session, and is not yet ready for LVAD instruction.  RN present during treatment.  Will continue to follow.   Follow Up Recommendations  CIR;Supervision/Assistance - 24 hour    Equipment Recommendations  None recommended by OT    Recommendations for Other Services Rehab consult    Precautions / Restrictions Precautions Precautions: Sternal;Fall Precaution Comments: LVAD       Mobility Bed Mobility Overal bed mobility: Needs Assistance Bed Mobility: Rolling;Sidelying to Sit Rolling: Max assist;+2 for physical assistance Sidelying to sit: Max assist;+2 for physical assistance       General bed mobility comments: physical assist to bend knees and roll bil for positioning, assist to elevate trunk and bring legs off of bed  Transfers Overall transfer level: Needs assistance   Transfers: Sit to/from Stand Sit to Stand: Total assist;+2 physical assistance         General transfer comment: bil knees blocked with assist of belt and pad for anterior translation and rise. Pt able to tolerate bil LE weight bearing  but increased time and difficulty to extend trunk with "bouncy"knees. Pt with initial stand with heavy right lean with total assist for initial stand grossly 20 sec. Repeat trial grossly 60 sec with transition from total to max assist. Transition from EOB to chair with maxisky +3 for lines and safety    Balance Overall balance assessment: Needs assistance   Sitting balance-Leahy Scale: Poor Sitting balance - Comments: pt with flexed trunk and min -minguard assist EOB grossly 6 min with cues for midline, attention and safety     Standing balance-Leahy Scale: Zero Standing balance comment: max +2 assist in standing                           ADL either performed or assessed with clinical judgement   ADL Overall ADL's : Needs assistance/impaired Eating/Feeding: Total assistance                       Toilet Transfer: Total assistance Toilet Transfer Details (indicate cue type and reason): uanble to attempt          Functional mobility during ADLs: Total assistance;+2 for physical assistance;Maximal assistance       Vision       Perception     Praxis      Cognition Arousal/Alertness: Lethargic Behavior During Therapy: Flat affect Overall Cognitive Status: Impaired/Different from baseline Area of Impairment: Orientation;Memory;Attention;Following commands;Safety/judgement;Problem solving  Orientation Level: Time Current Attention Level: Sustained Memory: Decreased recall of precautions;Decreased short-term memory Following Commands: Follows one step commands inconsistently;Follows one step commands with increased time Safety/Judgement: Decreased awareness of safety;Decreased awareness of deficits   Problem Solving: Slow processing;Decreased initiation;Difficulty sequencing;Requires verbal cues;Requires tactile cues General Comments: pt with difficulty maintaining eyes open, aware of surgery for pump, delayed response with difficulty  correcting balance and posture        Exercises General Exercises - Lower Extremity Long Arc Quad: AROM;Seated;Both;10 reps   Shoulder Instructions       General Comments daughter present     Pertinent Vitals/ Pain       Pain Assessment: Faces Pain Score: 4  Faces Pain Scale: Hurts little more Pain Location: bil LE hamstrings with transition into long sitting, relieved with reduction of tension Pain Descriptors / Indicators: Tightness Pain Intervention(s): Repositioned  Home Living                                          Prior Functioning/Environment              Frequency  Min 3X/week        Progress Toward Goals  OT Goals(current goals can now be found in the care plan section)  Progress towards OT goals: Progressing toward goals     Plan Discharge plan remains appropriate    Co-evaluation    PT/OT/SLP Co-Evaluation/Treatment: Yes Reason for Co-Treatment: Complexity of the patient's impairments (multi-system involvement);Necessary to address cognition/behavior during functional activity;For patient/therapist safety PT goals addressed during session: Mobility/safety with mobility;Balance;Strengthening/ROM OT goals addressed during session: Strengthening/ROM      AM-PAC PT "6 Clicks" Daily Activity     Outcome Measure   Help from another person eating meals?: Total Help from another person taking care of personal grooming?: Total Help from another person toileting, which includes using toliet, bedpan, or urinal?: Total Help from another person bathing (including washing, rinsing, drying)?: Total Help from another person to put on and taking off regular upper body clothing?: Total Help from another person to put on and taking off regular lower body clothing?: Total 6 Click Score: 6    End of Session Equipment Utilized During Treatment: Gait belt;Oxygen  OT Visit Diagnosis: Unsteadiness on feet (R26.81);Cognitive communication  deficit (R41.841)   Activity Tolerance Patient limited by fatigue   Patient Left in chair;with call bell/phone within reach;with chair alarm set;with nursing/sitter in room;with family/visitor present   Nurse Communication Mobility status;Need for lift equipment        Time: 0931-1004 OT Time Calculation (min): 33 min  Charges: OT General Charges $OT Visit: 1 Visit OT Treatments $Therapeutic Activity: 8-22 mins  [facility], OTR/L [phone]    Lucille Passy M 08/30/2017, 1:33 PM

## 2017-08-30 NOTE — Progress Notes (Signed)
LVAD Coordinator Rounding Not  Admitted 08/11/17 for initiation of Milrinone. 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 He was evaluated [facility] /Dr Posey Pronto on 5/9 for possible heart transplant.   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 Prescott Gum under Destination Therapy criteria. Dr [name] discussed heart transplant candidacy with Dr Posey Pronto. Given size and blood type there was concern he would not make it to transplant.   Pt sitting up in bed, daughter is feeding him breakfast. He is lethargic, but responds appropriately with verbal stimulation. Daughter reports periods of confusion overnight with pt trying to get OOB. She had her daughter spent the night.   Vital signs: Temp:  97.5 HR: 85 afib Auto BP:  86/65 (72) Doppler: 92 O2 Sat:  97% on 2 L/[place] Wt: 238>246>242>274>267>259>269>261>260>239>227>225>229 lbs   LVAD interrogation reveals:  Speed: 5200 Flow: 4.2 Power:  3.6w PI:  2.8 Alarms: none  Events: none Hematocrit:  30 Fixed speed: 5200 Low speed limit: 4900  Drive Line: Driveline dressing to be changed by VAD coordinator, nurse champion, or trained caregiver.   Labs:  LDH trend: 274>287>245>240>324>384>425>423>397>267>306  INR trend:  1.71>1.92>1.57>1.37>2.64>2.97>2.54>2.16>2.63>2.87>2.79  Anticoagulation Plan: -INR Goal: 2.0 - 2.5 -ASA Dose: 81 mg daily   Blood Products:  Intra op: - 08/16/17> one platelet; 2 FFP - 08/17/17> ome FFP  Post op: - 08/16/17 > one unit PCs - 08/21/17 > one unit PCs - 08/24/17 > one unit PCs  Device:  - BiV Medtronic - Therapies: off  Respiratory: vented  Nitric Oxide:    Gtts: - Epi 1 mcg/min - Milrinone 0..3107mcg/kg/min  Adverse Events on VAD: - 08/17/17>mediastinal reexploration with evacuation of mediastinal clot - 08/19/17>sternal closure  VAD education:  1. Caregiver is changing drive line dressing independently. 2. Nursing will begin working with the pt on changing  batteries.  Plan/Recommendations:  1. Daily dressing changes per VAD coordinator, nurse champion, or trained caregiver.  2. Page VAD Coordinator with any VAD equipment or drive line issues.   Zada Girt RN, VAD Coordinator 24/7 VAD Pager: [phone]

## 2017-08-31 ENCOUNTER — Inpatient Hospital Stay (HOSPITAL_COMMUNITY): Payer: PPO

## 2017-08-31 DIAGNOSIS — I5022 Chronic systolic (congestive) heart failure: Secondary | ICD-10-CM

## 2017-08-31 DIAGNOSIS — Z95811 Presence of heart assist device: Secondary | ICD-10-CM

## 2017-08-31 LAB — GLUCOSE, CAPILLARY
GLUCOSE-CAPILLARY: 129 mg/dL — AB (ref 70–99)
GLUCOSE-CAPILLARY: 156 mg/dL — AB (ref 70–99)
GLUCOSE-CAPILLARY: 73 mg/dL (ref 70–99)
GLUCOSE-CAPILLARY: 82 mg/dL (ref 70–99)
Glucose-Capillary: 182 mg/dL — ABNORMAL HIGH (ref 70–99)
Glucose-Capillary: 118 mg/dL — ABNORMAL HIGH (ref 70–99)
Glucose-Capillary: 61 mg/dL — ABNORMAL LOW (ref 70–99)
Glucose-Capillary: 229 mg/dL — ABNORMAL HIGH (ref 70–99)

## 2017-08-31 LAB — CBC
HEMATOCRIT: 30.2 % — AB (ref 39.0–52.0)
HEMOGLOBIN: 9.2 g/dL — AB (ref 13.0–17.0)
MCH: 25.8 pg — ABNORMAL LOW (ref 26.0–34.0)
MCHC: 30.5 g/dL (ref 30.0–36.0)
MCV: 84.6 fL (ref 78.0–100.0)
Platelets: 547 10*3/uL — ABNORMAL HIGH (ref 150–400)
RBC: 3.57 MIL/uL — ABNORMAL LOW (ref 4.22–5.81)
RDW: 17.5 % — ABNORMAL HIGH (ref 11.5–15.5)
WBC: 12.7 10*3/uL — ABNORMAL HIGH (ref 4.0–10.5)

## 2017-08-31 LAB — COOXEMETRY PANEL
Carboxyhemoglobin: 2.2 % — ABNORMAL HIGH (ref 0.5–1.5)
Methemoglobin: 0.8 % (ref 0.0–1.5)
O2 Saturation: 68.3 %
Total hemoglobin: 9.7 g/dL — ABNORMAL LOW (ref 12.0–16.0)

## 2017-08-31 LAB — COMPREHENSIVE METABOLIC PANEL
ALBUMIN: 2.3 g/dL — AB (ref 3.5–5.0)
ALK PHOS: 73 U/L (ref 38–126)
ALT: 73 U/L — ABNORMAL HIGH (ref 0–44)
ANION GAP: 6 (ref 5–15)
AST: 48 U/L — ABNORMAL HIGH (ref 15–41)
BUN: 38 mg/dL — ABNORMAL HIGH (ref 8–23)
CO2: 24 mmol/L (ref 22–32)
Calcium: 9 mg/dL (ref 8.9–10.3)
Chloride: 111 mmol/L (ref 98–111)
Creatinine, Ser: 1.3 mg/dL — ABNORMAL HIGH (ref 0.61–1.24)
GFR calc Af Amer: >60 mL/min (ref >60)
GFR calc non Af Amer: 55 mL/min — ABNORMAL LOW (ref >60)
GLUCOSE: 117 mg/dL — AB (ref 70–99)
Potassium: 3.6 mmol/L (ref 3.5–5.1)
SODIUM: 141 mmol/L (ref 135–145)
Total Bilirubin: 1.7 mg/dL — ABNORMAL HIGH (ref 0.3–1.2)
Total Protein: 7.4 g/dL (ref 6.5–8.1)

## 2017-08-31 LAB — MAGNESIUM: Magnesium: 2.3 mg/dL (ref 1.7–2.4)

## 2017-08-31 LAB — PROTIME-INR
INR: 2.67
Prothrombin Time: 28.2 seconds — ABNORMAL HIGH (ref 11.4–15.2)

## 2017-08-31 LAB — LACTATE DEHYDROGENASE: LDH: 239 U/L — ABNORMAL HIGH (ref 98–192)

## 2017-08-31 MED ORDER — POTASSIUM CHLORIDE CRYS ER 20 MEQ PO TBCR: 40.0000 meq | EXTENDED_RELEASE_TABLET | Freq: Two times a day (BID) | ORAL | Status: DC

## 2017-08-31 MED ORDER — WARFARIN SODIUM 1 MG PO TABS
1.0000 mg | ORAL_TABLET | Freq: Once | ORAL | Status: AC
  Administered 2017-08-31: 1 mg via ORAL
  Filled 2017-08-31: qty 1

## 2017-08-31 MED ORDER — POTASSIUM CHLORIDE 20 MEQ PO PACK
40.0000 meq | PACK | Freq: Two times a day (BID) | ORAL | Status: DC
  Administered 2017-08-31 – 2017-09-01 (×4): 40 meq via ORAL
  Filled 2017-08-31 (×6): qty 2

## 2017-08-31 MED ORDER — INSULIN DETEMIR 100 UNIT/ML ~~LOC~~ SOLN
30.0000 [IU] | Freq: Every day | SUBCUTANEOUS | Status: DC
  Administered 2017-08-31 – 2017-09-06 (×7): 30 [IU] via SUBCUTANEOUS
  Filled 2017-08-31 (×8): qty 0.3

## 2017-08-31 MED ORDER — DEXTROSE 50 % IV SOLN
INTRAVENOUS | Status: AC
  Administered 2017-08-31: 50 mL
  Filled 2017-08-31: qty 50

## 2017-08-31 MED ORDER — CHLORHEXIDINE GLUCONATE CLOTH 2 % EX PADS: 6.0000 | MEDICATED_PAD | Freq: Every day | CUTANEOUS | Status: DC

## 2017-08-31 MED ORDER — POTASSIUM CHLORIDE 20 MEQ PO PACK
40.0000 meq | PACK | Freq: Once | ORAL | Status: AC
  Filled 2017-08-31: qty 2

## 2017-08-31 NOTE — Progress Notes (Signed)
  Speech Language Pathology Treatment: Dysphagia  Patient Details Name: [name] [name] MRN: [id number] DOB: 1951/01/11 Today's Date: 08/31/2017 Time: 6384-6659 SLP Time Calculation (min) (ACUTE ONLY): 37 min  Assessment / Plan / Recommendation Clinical Impression  Pt exhibited mild signs of aspiration during dysphagia treatment session with daughter present. He required max verbal/visual/tactile cues to recall and accurately perform chin tuck during swallow of liquids due to slow processing and execution of movements. Vocal quality wet most of session with delayed reflexive throat clears. Volitional throat clears assisted to clear vocal quality. Daughter present, answered questions re: texture/honey thick liquids. Dys 2 texture trialed with minimal lingual residue cleared with liquid wash. Will upgrade to Dys 2, continue honey thick and continue to provide FULL supervision for chin tuck during meals. CXR today showed possible pna.    HPI HPI: 67 y.o. 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       SLP Plan  Continue with current plan of care       Recommendations  Diet recommendations: Dysphagia 2 (fine chop);Honey-thick liquid Liquids provided via: Cup Medication Administration: Crushed with puree Supervision: Patient able to self feed;Full supervision/cueing for compensatory strategies Compensations: Minimize environmental distractions;Slow rate;Small sips/bites;Chin tuck Postural Changes and/or Swallow Maneuvers: Seated upright 90 degrees                General recommendations: Rehab consult Oral Care Recommendations: Oral care BID Follow up Recommendations: Inpatient Rehab SLP Visit Diagnosis: Dysphagia, oral phase (R13.11);Dysphagia, pharyngeal phase (R13.13);Dysphagia, pharyngoesophageal phase (R13.14) Plan: Continue with current plan of  care       GO                Houston Siren 08/31/2017, 11:36 AM  Orbie Pyo Colvin Caroli.[employer] [phone]

## 2017-08-31 NOTE — Progress Notes (Signed)
Hypoglycemic Event  CBG: 38  Treatment: D50 IV 50 mL  Symptoms: Shaky  Follow-up CBG: Time: 0525 CBG Result: 118  Possible Reasons for Event: Unknown  Comments/MD notified: No    Anny Sayler F

## 2017-08-31 NOTE — Progress Notes (Signed)
[name] for coumadin Indication: LVAD  Allergies  Allergen Reactions  . Penicillins Hives, Itching and Other (See Comments)    Has patient had a PCN reaction causing immediate rash, facial/tongue/throat swelling, SOB or lightheadedness with hypotension:# # Yes # # Has patient had a PCN reaction causing severe rash involving mucus membranes or skin necrosis: No Has patient had a PCN reaction that required hospitalization: No Has patient had a PCN reaction occurring within the last 10 years: No If all of the above answers are "NO", then may proceed with Cephalosporin use.     Patient Measurements: Height: 5\' 11"  (180.3 cm) Weight: 227 lb 15.3 oz (103.4 kg) IBW/kg (Calculated) : 75.3 Heparin Dosing Weight: 98.3 kg  Vital Signs: Temp: 97.8 F (36.6 C) (07/02 0413) Temp Source: Oral (07/02 0413) BP: 128/114 (07/02 0800) Pulse Rate: 76 (07/02 0700)  Labs: Recent Labs    08/29/17 0400 08/30/17 0340 08/31/17 0358  HGB  --   --  9.2*  HCT  --   --  30.2*  PLT  --   --  547*  LABPROT 29.8* 29.2* 28.2*  INR 2.87 2.79 2.67  CREATININE 1.67* 1.39* 1.30*    Estimated Creatinine Clearance: 67.5 mL/min (A) (by C-G formula based on SCr of 1.3 mg/dL (H)).   Medical History: Past Medical History:  Diagnosis Date  . AICD (automatic cardioverter/defibrillator) present 02/05/2014   Upgrade to Medtronic biventricular ICD, serial number  [id number]   . Atrial flutter ([place]) 04/2012   s/p TEE-EPS+RFCA 04/2012  . CAD (coronary artery disease) 1017,5102 X 2    RCA-T, 70% PL (off CFX), 99% Prox LAD/90% Dist LAD, S/P TAXUS stent x 2  . CHF (congestive heart failure) ([place])   . Chronic anticoagulation   . Chronic systolic heart failure ([place])   . CKD (chronic kidney disease)   . Diabetic retinopathy ([place])   . DM type 2 (diabetes mellitus, type 2) (HCC)    insulin dependent  . HTN (hypertension)   . Hypercholesteremia    ablation  . ICD  (implantable cardiac defibrillator) in place   . Ischemic cardiomyopathy March 2015   20-25% 2D   . Nephrolithiasis   . Ventricular tachycardia (HCC)     Medications:  Scheduled:  . amiodarone  200 mg Oral BID  . aspirin  81 mg Oral Daily  . bisacodyl  10 mg Oral Daily   Or  . bisacodyl  10 mg Rectal Daily  . [START ON 09/01/2017] Chlorhexidine Gluconate Cloth  6 each Topical Daily  . collagenase   Topical Daily  . insulin aspart  0-24 Units Subcutaneous Q4H  . insulin detemir  30 Units Subcutaneous BID  . levalbuterol  0.63 mg Nebulization TID  . mouth rinse  15 mL Mouth Rinse BID  . metoCLOPramide (REGLAN) injection  10 mg Intravenous Q6H  . potassium chloride  40 mEq Oral Daily  . sodium chloride flush  10-40 mL Intracatheter Q12H  . spironolactone  25 mg Oral BID  . Warfarin - Pharmacist Dosing Inpatient   Does not apply q1800    Assessment: 50 yom who underwent LVAD placement on 6/17  S/p Heparin drip stopped with INR > 2. INR remains above goal at 2.69 (down slightly to 2.79). Hgb 9.2, plt 547, LDH stable. No s/sx of bleeding. On concurrent amiodarone, which can impact INR sensitivity.  LFTs/Tbili elevated but continues to trend down.   Goal of Therapy:  INR goal 2-2.5 Monitor platelets  by anticoagulation protocol: Yes   Plan:   Warfarin 1 mg x1  Monitor s/s bleeding Will provide education prior to discharge  [name] [name], PharmD Clinical Pharmacist  Pager: [phone] Phone: [phone] 08/31/2017 8:56 AM

## 2017-08-31 NOTE — Progress Notes (Addendum)
Patient ID: LAIDEN MILLES, male   DOB: [birth year], 67 y.o.   MRN: [id number]     Advanced Heart Failure Rounding Note  PCP-Cardiologist: No primary care provider on file.   Subjective:    Events 6/17 Underwent HM-3 implant -> Unable to close chest with RV failure and high intrathoracic pressure 6/18 Taken back to OR for evacuation of hematoma. NO RVAD needed. Left sternum open  08/19/17 Taken back to OR for sternal closure 6/22 VT => amiodarone started.  6/24 VT/VF=>urgent cardioversion. 6/25 VT  =>urgent cardioversion   Coox 68.3% on milrinone 0.375. Off Epi and NE. All tubes out.   CVP 12-13  Sitting up in chair. More awake and alert this am. Pacing wires to be pulled. Remains on Honey-thick diet. Asking for ice. Denies CP or SOB. No edema, orthopnea or PND. No bleeding. Remains in AF.   LVAD Interrogation HM 3: Speed: 5200 Flow: 4.0 PI: 2.8 Power: 4.0. VAD interrogated personally. Parameters stable.     Objective:   Weight Range: 227 lb 15.3 oz (103.4 kg) Body mass index is 31.79 kg/m.   Vital Signs:   Temp:  [97.4 F (36.3 C)-98.6 F (37 C)] 97.8 F (36.6 C) (07/02 0413) Pulse Rate:  [30-122] 76 (07/02 0700) Resp:  [19-41] 33 (07/02 0700) BP: (72-187)/(57-146) 128/114 (07/02 0800) SpO2:  [89 %-98 %] 93 % (07/02 0700) Weight:  [227 lb 15.3 oz (103.4 kg)] 227 lb 15.3 oz (103.4 kg) (07/02 0500) Last BM Date: 08/28/17  Weight change: Filed Weights   08/29/17 0426 08/30/17 0435 08/31/17 0500  Weight: 225 lb 1.4 oz (102.1 kg) 229 lb 8 oz (104.1 kg) 227 lb 15.3 oz (103.4 kg)   Intake/Output:   Intake/Output Summary (Last 24 hours) at 08/31/2017 0840 Last data filed at 08/31/2017 0800 Gross per 24 hour  Intake 727.78 ml  Output 505 ml  Net 222.78 ml   Physical Exam   MAPs 70-80s  General: Sitting in chair. Weak appearing NAD. Hoarse soft voice HEENT: Normal. Anicteric  Neck: Supple, JVP 7-8 cm. Carotids OK.  Cardiac:  Mechanical heart sounds with LVAD hum  present.  Lungs:  CTAB, normal effort.  No wheeze Abdomen:  Obese NT, ND, no HSM. No bruits or masses. +BS  LVAD exit site: Well-healed and incorporated. Dressing dry and intact. No erythema or drainage. Stabilization device present and accurately applied. Driveline dressing changed daily per sterile technique. Extremities:  Warm and dry. No cyanosis, clubbing, rash, or edema.  Neuro:  Alert to person and place.  Moves all 4 without difficulty.   Telemetry   A fib 80s, with PVCs, personally reviewed.   Labs    CBC Recent Labs    08/31/17 0358  WBC 12.7*  HGB 9.2*  HCT 30.2*  MCV 84.6  PLT 007*   Basic Metabolic Panel Recent Labs    08/30/17 0340 08/31/17 0358  NA 140 141  K 4.1 3.6  CL 102 111  CO2 28 24  GLUCOSE 117* 117*  BUN 45* 38*  CREATININE 1.39* 1.30*  CALCIUM 9.3 9.0  MG 2.5* 2.3   Liver Function Tests Recent Labs    08/30/17 0340 08/31/17 0358  AST 75* 48*  ALT 93* 73*  ALKPHOS 78 73  BILITOT 1.5* 1.7*  PROT 7.5 7.4  ALBUMIN 2.4* 2.3*   No results for input(s): LIPASE, AMYLASE in the last 72 hours. Cardiac Enzymes No results for input(s): CKTOTAL, CKMB, CKMBINDEX, TROPONINI in the last 72 hours.  BNP: BNP (  last 3 results) Recent Labs    08/17/17 0314 08/23/17 0024 08/30/17 0000  BNP 266.4* 450.5* 114.6*    ProBNP (last 3 results) Recent Labs    11/12/16 1559  PROBNP 388.0*     D-Dimer No results for input(s): DDIMER in the last 72 hours. Hemoglobin A1C No results for input(s): HGBA1C in the last 72 hours. Fasting Lipid Panel No results for input(s): CHOL, HDL, LDLCALC, TRIG, CHOLHDL, LDLDIRECT in the last 72 hours. Thyroid Function Tests No results for input(s): TSH, T4TOTAL, T3FREE, THYROIDAB in the last 72 hours.  Invalid input(s): FREET3  Other results:   Imaging    No results found.   Medications:     Scheduled Medications: . amiodarone  200 mg Oral BID  . aspirin  81 mg Oral Daily  . bisacodyl  10 mg Oral  Daily   Or  . bisacodyl  10 mg Rectal Daily  . [START ON 09/01/2017] Chlorhexidine Gluconate Cloth  6 each Topical Daily  . collagenase   Topical Daily  . insulin aspart  0-24 Units Subcutaneous Q4H  . insulin detemir  30 Units Subcutaneous BID  . levalbuterol  0.63 mg Nebulization TID  . mouth rinse  15 mL Mouth Rinse BID  . metoCLOPramide (REGLAN) injection  10 mg Intravenous Q6H  . potassium chloride  40 mEq Oral Daily  . sodium chloride flush  10-40 mL Intracatheter Q12H  . spironolactone  25 mg Oral BID  . Warfarin - Pharmacist Dosing Inpatient   Does not apply q1800    Infusions: . sodium chloride Stopped (08/30/17 2300)  . EPINEPHrine 4 mg in dextrose 5% 250 mL infusion (16 mcg/mL) Stopped (08/31/17 0116)  . famotidine (PEPCID) IV Stopped (08/30/17 2224)  . lactated ringers Stopped (08/19/17 1602)  . milrinone 0.375 mcg/kg/min (08/31/17 0800)    PRN Medications: acetaminophen (TYLENOL) oral liquid 160 mg/5 mL, clonazepam, hydrALAZINE, lactated ringers, ondansetron (ZOFRAN) IV, oxyCODONE, RESOURCE THICKENUP CLEAR, sodium chloride flush    Patient Profile  DAVONN FLANERY is a 67 y.o. 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   Directly admitted with persistent low cardiac output for milrinone initiation for home.  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 Acute/Chronic systolic CHF with biventricular failure-> cardiogenic shock: Echo  08/13/2017 EF 20-25%. s/p  Medtronic BiV ICD in place. Cath 12/18 with stable 1v CAD.  s/p HM-3 implant 6/17. Unable to close chest due to high-intrathoracic pressures and RV failure. Taken back to OR 08/17/17 with evacuation of hematoma with improvement.   - Extubated 6/28  - Coox 68.3% on milrinone 0.375 mcg/kg/min. Off Epi and NE. Will decrease to 0.25 mcg/kg/min.  - Weight down 47 pounds in the past week.  Now well below pre-op weight. CVP 12-13 - Need to mobilize 2. VAD - s/p HM-3 implant 6/17  - VAD interrogated personally. Parameters stable.   - INR 2.67. Discussed dosing with PharmD personally.  - Continue asa 81 mg daily.  - LDH 239 3. Acute respiratory failure post-op - Discussion as above. Stable.  4. Acute on CKD II-III:  -Creatinine 1.30 today.   5. [name]/VF: Recurrent VT on 6/22 - S/P VF 6/24 emergent bedside cardioversion.  - S/P VT 6/26 emergent bedside cardioversion.  - No VT over night. Continue amiodarone gtt.  - Keep K >  4.0 Mg > 2.0.  6. AFL - S/p previous ablation. Rate controlled Well-tolerated 7. [name]: Recent cath with stable CAD as above - No change to current plan.   8. [name]: Recent A1c 8.3 on 6/3.  - BG running low this am. Will adjust SSI.  9. Anemia:  - Post-op.  Got 1 unit PRBCs 6/22 and 6/25. - Hgb 9.2 10. Fevers  - Afebrile. Cx negative.  - On meropenem will continue for now  11. Deconditioning - Severe. Need to mobilize with PT. CIR consult placed.  12. Dysphagia - Progressing. Now on Honey/Thick.  13. Lethargy/delirium - Much improved with med adjustments.    Length of Stay: [place], [place]  08/31/2017, 8:40 AM  Advanced Heart Failure Team Pager [phone] (M-F; 7a - 4p)  Please contact [facility] for night-coverage after hours (4p -7a ) and weekends on [email or web address]  Patient seen and examined with the above-signed Advanced Practice Provider and/or Housestaff. I personally reviewed laboratory data, imaging studies and relevant notes. I independently examined the patient and formulated the important aspects of the plan. I have edited the note to reflect any of my changes or salient points. I have personally discussed the plan with the patient and/or family.  Remains on milrinone 0.375. Of other pressors. Co-ox and volume status ok after 47 pound diuresis post-op. CVP 12. Will cut milrinone to 0.25. Lethargy  improving with stopping seroquel and benzos but still quite weak. Will need aggressive PT. Incentive spirometer reviewed with him ate bedside and he actually did quite well. No further VT. INR 2.67. VAD interrogated personally. Can stop meropenem soon. Parameters stable.  Glori Bickers, MD  6:37 PM

## 2017-08-31 NOTE — Progress Notes (Signed)
LVAD Coordinator Rounding Not  Admitted 08/11/17 for initiation of Milrinone. 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 He was evaluated [facility] /Dr Posey Pronto on 5/9 for possible heart transplant.   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 Prescott Gum under Destination Therapy criteria. Dr [name] discussed heart transplant candidacy with Dr Posey Pronto. Given size and blood type there was concern he would not make it to transplant.   Pt sitting in chair, daughter is feeding him breakfast. Pt more awake and alert this am, still having some periods of confusion per family.  Nurse reports staff has used lift to get pt out and back into bed; no wt bearing.   Vital signs: Temp:  97.8 HR: 72 afib Auto BP:  87/63 (73) Doppler: 72 O2 Sat:  91 % on RA Wt: 238>246>242>274>267>259>269>261>260>239>227>225>229>229 lbs   LVAD interrogation reveals:  Speed: 5200 Flow: 4.4 Power:  3.6w PI:  2.2 Alarms: none  Events: none Hematocrit:  30 Fixed speed: 5200 Low speed limit: 4900  Drive Line: Driveline dressing to be changed by VAD coordinator, nurse champion, or trained caregiver.   Labs:  LDH trend: 274>287>245>240>324>384>425>423>397>267>306>239  INR trend:  1.71>1.92>1.57>1.37>2.64>2.97>2.54>2.16>2.63>2.87>2.79>2.67  Anticoagulation Plan: -INR Goal: 2.0 - 2.5 -ASA Dose: 81 mg daily   Blood Products:  Intra op: - 08/16/17> one platelet; 2 FFP - 08/17/17> ome FFP  Post op: - 08/16/17 > one unit PCs - 08/21/17 > one unit PCs - 08/24/17 > one unit PCs  Device:  - BiV Medtronic - Therapies: off  Respiratory: extubated 08/27/17  Nitric Oxide: off 08/21/17  Gtts: - Epi 1 mcg/min - stopped 08/31/17 - Milrinone 0..323mcg/kg/min  Adverse Events on VAD: - 08/17/17>mediastinal reexploration with evacuation of mediastinal clot - 08/19/17>sternal closure  VAD education:  1. Caregiver is changing drive line dressing independently.  2. Pt very lethargic - unable to  participate in VAD education today.  Plan/Recommendations:  1. Daily dressing changes per VAD coordinator, nurse champion, or trained caregiver.  2. Page VAD Coordinator with any VAD equipment or drive line issues.   Zada Girt RN, VAD Coordinator 24/7 VAD Pager: [phone]

## 2017-08-31 NOTE — Progress Notes (Addendum)
Left abdominal gauze dressing with small amount brownish drainage noted. Dressing removed and site care performed using sterile technique. Drive line exit site cleaned with Chlora prep applicators x 2, allowed to dry, and gauze dressing with silver strip re-applied. Exit site healing and unincorporated, sutures intact, the velour is fully implanted at exit site. Brownish drainage with no redness, tenderness, foul odor or rash noted. Drive line anchor re-applied.   Wife asleep at bedside - did not disturb her for dressing change.  Zada Girt RN, VAD Coordinator 24/7 VAD pager: [phone]

## 2017-08-31 NOTE — Progress Notes (Signed)
HeartMate 3  postop day #15 rounding Note  Subjective:   HeartMate 3 implantation with combined tricuspid valve annuloplasty repair June 17 for mixed ischemic and nonischemic cardiomyopathy, cardiac amyloid disease and preoperative moderate to severe RV dysfunction, preoperative moderate to severe tricuspid regurgitation  Patient had significant RV dysfunction and sternum was left open to maintain adequate hemodynamics.  During the night after surgeryThe patient developed  further signs of right ventricular dysfunction and was taken emergently back for mediastinal reexploration.  A clot was found compressing the right atrium from oozing from the pericardium where adhesions had been taken down at the time of surgery.  The patient returned to the operating room on June 20 for delayed sternal closure.  . Chronic atrial fibrillation. INR therapeutic.  Heparin off  Postop VT now improved for > 1weeks on amiodarone  Patient extubated 6/27 and stood at side of bed.  Chest x-ray shows significant improved with volume removal and aggressive diuresis.Able to get OOB to chair now with PT.  Still using lift.  Swallow study performed and pt on dysphagia diet Patient more engaged and getting stronger.  VAD parameters saisfactory, RV function improving, co-ox good on milrinone   Surgical incisions clean and dry.  Temporary pacing wires have been removed.  LVAD INTERROGATION:  HeartMate II LVAD:  Flow 4 liters/min, speed 5200, power 3.8, PI 2.8.  Controller intact.   Objective:    Vital Signs:   Temp:  [95.1 F (35.1 C)-98.6 F (37 C)] 96.7 F (35.9 C) (07/02 1800) Pulse Rate:  [30-127] 91 (07/02 1800) Resp:  [17-33] 27 (07/02 1800) BP: (72-187)/(51-146) 81/51 (07/02 1800) SpO2:  [88 %-100 %] 98 % (07/02 1800) Weight:  [227 lb 15.3 oz (103.4 kg)] 227 lb 15.3 oz (103.4 kg) (07/02 0500) Last BM Date: 08/31/17 Mean arterial Pressure 75-80 mmHg  Intake/Output:   Intake/Output Summary (Last 24  hours) at 08/31/2017 1853 Last data filed at 08/31/2017 1800 Gross per 24 hour  Intake 493.99 ml  Output 475 ml  Net 18.99 ml     Physical Exam: General:  Well appearing.  Intubated and sedated HEENT: normal Neck: supple. JVP . Carotids 2+ bilat; no bruits. No lymphadenopathy or thryomegaly appreciated. Cor: Mechanical heart sounds with LVAD hum present. Lungs: clear Abdomen: soft, nontender, nondistended. No hepatosplenomegaly. No bruits or masses. Good bowel sounds. Extremities: no cyanosis, clubbing, rash, edema Neuro: alert & orientedx3, cranial nerves grossly intact. moves all 4 extremities w/o difficulty. Affect pleasant  Telemetry: V paced at 90/min for underlying A. fib  Labs: Basic Metabolic Panel: Recent Labs  Lab 08/27/17 0307  08/28/17 0359 08/28/17 0740 08/29/17 0400 08/30/17 0340 08/31/17 0358  NA 137   < > 132* 138 137 140 141  K 3.5   < > NOT DONE 3.3* 4.1 4.1 3.6  CL 92*   < > 89* 93* 94* 102 111  CO2 34*   < > 32 32 30 28 24   GLUCOSE 188*   < > 169* 57* 163* 117* 117*  BUN 57*   < > 60* 59* 58* 45* 38*  CREATININE 1.38*   < > 1.48* 1.47* 1.67* 1.39* 1.30*  CALCIUM 9.2   < > 9.0 9.6 9.2 9.3 9.0  MG 2.2  --  2.4  --  2.6* 2.5* 2.3  PHOS 4.5  --  4.0  --   --   --   --    < > = values in this interval not displayed.    Liver  Function Tests: Recent Labs  Lab 08/28/17 0359 08/28/17 0740 08/29/17 0400 08/30/17 0340 08/31/17 0358  AST 320* 253* 133* 75* 48*  ALT 189* 177* 130* 93* 73*  ALKPHOS 86 92 83 78 73  BILITOT 3.7* 2.0* 1.8* 1.5* 1.7*  PROT 7.3 7.3 7.2 7.5 7.4  ALBUMIN 2.3* 2.3* 2.3* 2.4* 2.3*   No results for input(s): LIPASE, AMYLASE in the last 168 hours. No results for input(s): AMMONIA in the last 168 hours.  CBC: Recent Labs  Lab 08/25/17 0500 08/26/17 0402 08/27/17 0307 08/28/17 0359 08/31/17 0358  WBC 13.0* 14.7* 13.7* 15.9* 12.7*  HGB 8.4* 8.8* 8.9* 9.4* 9.2*  HCT 25.9* 27.6* 28.5* 30.4* 30.2*  MCV 83.5 83.4 83.3 86.4 84.6   PLT 338 373 379 411* 547*    INR: Recent Labs  Lab 08/27/17 0307 08/28/17 0359 08/29/17 0400 08/30/17 0340 08/31/17 0358  INR 2.16 2.63 2.87 2.79 2.67    Other results:  EKG:   Imaging: Dg Chest 2 View  Result Date: 08/31/2017 CLINICAL DATA:  Left ventricular assist device. EXAM: CHEST - 2 VIEW COMPARISON:  Radiograph August 30, 2017. FINDINGS: Stable cardiomegaly. Left ventricular assist device is unchanged in position. Left-sided pacemaker is unchanged in position. Right-sided PICC line is noted with distal tip in right atrium. No pneumothorax is noted. Stable bilateral lung opacities are noted concerning for edema or possibly pneumonia. Bony thorax is unremarkable. IMPRESSION: Left ventricular assist device unchanged in position. Stable bilateral lung opacities as described above. Electronically Signed   By: Marijo Conception, M.D.   On: 08/31/2017 09:10   Dg Chest Port 1 View  Result Date: 08/30/2017 CLINICAL DATA:  Shortness of Breath EXAM: PORTABLE CHEST 1 VIEW COMPARISON:  August 29, 2017 FINDINGS: Central catheter tip is in the superior vena cava. Pacemaker leads are attached the right atrium, right ventricle, and coronary sinus. There is a left ventricular assist device present. There is a tricuspid valve replacement. No pneumothorax. There is patchy airspace consolidation throughout much of the right lung, particular in the right upper lobe as well as in the left base region. There is a small left pleural effusion. Heart overall is mildly enlarged with pulmonary vascularity normal. No adenopathy. There are surgical clips in the left neck region. IMPRESSION: Areas of patchy airspace opacity concerning for multifocal pneumonia. A degree of pulmonary edema may also present in this manner. Both entities may be present concurrently. The appearance is stable compared to 1 day prior. Stable cardiac silhouette.  Postoperative changes noted. Electronically Signed   By: Lowella Grip III M.D.    On: 08/30/2017 07:38     Medications:     Scheduled Medications: . amiodarone  200 mg Oral BID  . aspirin  81 mg Oral Daily  . bisacodyl  10 mg Oral Daily   Or  . bisacodyl  10 mg Rectal Daily  . [START ON 09/01/2017] Chlorhexidine Gluconate Cloth  6 each Topical Daily  . collagenase   Topical Daily  . insulin aspart  0-24 Units Subcutaneous Q4H  . insulin detemir  30 Units Subcutaneous QHS  . levalbuterol  0.63 mg Nebulization TID  . mouth rinse  15 mL Mouth Rinse BID  . metoCLOPramide (REGLAN) injection  10 mg Intravenous Q6H  . potassium chloride  40 mEq Oral BID  . sodium chloride flush  10-40 mL Intracatheter Q12H  . spironolactone  25 mg Oral BID  . Warfarin - Pharmacist Dosing Inpatient   Does not apply [phone]  Infusions: . sodium chloride Stopped (08/31/17 1035)  . famotidine (PEPCID) IV Stopped (08/31/17 0935)  . lactated ringers Stopped (08/19/17 1602)  . 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  Tolerating dysphagia 2 diet. CXR improving with diuresis and mobilization Will stop antibiotics this p.m.  I reviewed the LVAD parameters from today, and compared the results to the patient's prior recorded data.  No programming changes were made.  The LVAD is functioning within specified parameters.   The patient performs LVAD self-test daily.  LVAD interrogation was negative for any significant power changes, alarms or PI events/speed drops.  LVAD equipment check completed and is in good working order.  Back-up equipment present.   LVAD education done on emergency procedures and precautions and reviewed exit site care.  Length of Stay: [place] [facility] 08/31/2017, 6:53 PM

## 2017-08-31 NOTE — Progress Notes (Signed)
Hypoglycemic Event  CBG: 61  Treatment: 15 GM carbohydrate snack  Symptoms: Shaky  Follow-up CBG: Time:0900 CBG Result:73  Additional snack given.  Possible Reasons for Event: Unknown  Comments/MD notified: Discussed with Barrington Ellison, PA during AM rounds.     Sherlyn Lees

## 2017-08-31 NOTE — Progress Notes (Deleted)
Pt too weak to stand on own, requiring overhead lift to get OOB to chair at this time. Unable to take pt down for 2 view Xray, as this requires pt to be able to stand on own - not safe at this time. Order changed to 1 view per protocol. Will continue to monitor.

## 2017-08-31 NOTE — Progress Notes (Signed)
Inpatient Rehabilitation-Admissions Coordinator    Met with patient and daughter at the bedside to discuss team's recommendation for inpatient rehabilitation. Shared booklets, expectations while in CIR, expected length of stay, and anticipated functional level at DC. AC will need to speak with wife regarding assistance she can provide at home after CIR to see if there are any caregiver limitations to anticipate. Plan to follow for timing of medical readiness and increased activity tolerance with therapy. Please call if questions.   Jhonnie Garner, OTR/L  Rehab Admissions Coordinator  [phone] 08/31/2017 11:04 AM

## 2017-09-01 LAB — PROTIME-INR
INR: 2.61
Prothrombin Time: 27.7 seconds — ABNORMAL HIGH (ref 11.4–15.2)

## 2017-09-01 LAB — CBC
HEMATOCRIT: 30.8 % — AB (ref 39.0–52.0)
HEMOGLOBIN: 9.4 g/dL — AB (ref 13.0–17.0)
MCH: 25.9 pg — ABNORMAL LOW (ref 26.0–34.0)
MCHC: 30.5 g/dL (ref 30.0–36.0)
MCV: 84.8 fL (ref 78.0–100.0)
Platelets: 564 10*3/uL — ABNORMAL HIGH (ref 150–400)
RBC: 3.63 MIL/uL — ABNORMAL LOW (ref 4.22–5.81)
RDW: 18.3 % — ABNORMAL HIGH (ref 11.5–15.5)
WBC: 12.6 10*3/uL — ABNORMAL HIGH (ref 4.0–10.5)

## 2017-09-01 LAB — GLUCOSE, CAPILLARY
GLUCOSE-CAPILLARY: 38 mg/dL — AB (ref 70–99)
Glucose-Capillary: 102 mg/dL — ABNORMAL HIGH (ref 70–99)
Glucose-Capillary: 141 mg/dL — ABNORMAL HIGH (ref 70–99)
Glucose-Capillary: 175 mg/dL — ABNORMAL HIGH (ref 70–99)
Glucose-Capillary: 218 mg/dL — ABNORMAL HIGH (ref 70–99)
Glucose-Capillary: 226 mg/dL — ABNORMAL HIGH (ref 70–99)

## 2017-09-01 LAB — COOXEMETRY PANEL
Carboxyhemoglobin: 1.8 % — ABNORMAL HIGH (ref 0.5–1.5)
Methemoglobin: 1.8 % — ABNORMAL HIGH (ref 0.0–1.5)
O2 Saturation: 57.6 %
Total hemoglobin: 9.9 g/dL — ABNORMAL LOW (ref 12.0–16.0)

## 2017-09-01 LAB — COMPREHENSIVE METABOLIC PANEL
ALK PHOS: 79 U/L (ref 38–126)
ALT: 59 U/L — AB (ref 0–44)
AST: 37 U/L (ref 15–41)
Albumin: 2.4 g/dL — ABNORMAL LOW (ref 3.5–5.0)
Anion gap: 7 (ref 5–15)
BILIRUBIN TOTAL: 1.4 mg/dL — AB (ref 0.3–1.2)
BUN: 38 mg/dL — AB (ref 8–23)
CALCIUM: 9.2 mg/dL (ref 8.9–10.3)
CO2: 22 mmol/L (ref 22–32)
Chloride: 115 mmol/L — ABNORMAL HIGH (ref 98–111)
Creatinine, Ser: 1.31 mg/dL — ABNORMAL HIGH (ref 0.61–1.24)
GFR calc Af Amer: >60 mL/min (ref >60)
GFR calc non Af Amer: 55 mL/min — ABNORMAL LOW (ref >60)
Glucose, Bld: 91 mg/dL (ref 70–99)
Potassium: 4.4 mmol/L (ref 3.5–5.1)
Sodium: 144 mmol/L (ref 135–145)
TOTAL PROTEIN: 7.6 g/dL (ref 6.5–8.1)

## 2017-09-01 LAB — CULTURE, RESPIRATORY W GRAM STAIN
Culture: NORMAL
Special Requests: NORMAL

## 2017-09-01 LAB — MAGNESIUM: Magnesium: 2.3 mg/dL (ref 1.7–2.4)

## 2017-09-01 LAB — LACTATE DEHYDROGENASE: LDH: 245 U/L — ABNORMAL HIGH (ref 98–192)

## 2017-09-01 MED ORDER — COLLAGENASE 250 UNIT/GM EX OINT
TOPICAL_OINTMENT | Freq: Every day | CUTANEOUS | Status: DC
  Administered 2017-09-02: via TOPICAL
  Filled 2017-09-01: qty 30

## 2017-09-01 MED ORDER — CHLORHEXIDINE GLUCONATE CLOTH 2 % EX PADS
6.0000 | MEDICATED_PAD | Freq: Every day | CUTANEOUS | Status: DC
  Administered 2017-09-02 – 2017-09-09 (×9): 6 via TOPICAL

## 2017-09-01 MED ORDER — SPIRONOLACTONE 25 MG PO TABS
25.0000 mg | ORAL_TABLET | Freq: Every day | ORAL | Status: DC
  Administered 2017-09-01 – 2017-09-10 (×10): 25 mg via ORAL
  Filled 2017-09-01 (×9): qty 1

## 2017-09-01 MED ORDER — SODIUM CHLORIDE 0.9 % IV BOLUS
500.0000 mL | Freq: Once | INTRAVENOUS | Status: AC
  Administered 2017-09-01: 500 mL via INTRAVENOUS

## 2017-09-01 MED ORDER — ALBUMIN HUMAN 25 % IV SOLN
12.5000 g | INTRAVENOUS | Status: AC
  Administered 2017-09-01 (×3): 12.5 g via INTRAVENOUS
  Filled 2017-09-01 (×3): qty 50

## 2017-09-01 MED ORDER — WARFARIN SODIUM 1 MG PO TABS
1.0000 mg | ORAL_TABLET | Freq: Once | ORAL | Status: AC
  Administered 2017-09-01: 1 mg via ORAL
  Filled 2017-09-01: qty 1

## 2017-09-01 NOTE — Progress Notes (Signed)
HeartMate 3  postop day #16 rounding Note  Subjective:   HeartMate 3 implantation with combined tricuspid valve annuloplasty repair June 17 for mixed ischemic and nonischemic cardiomyopathy, cardiac amyloid disease and preoperative moderate to severe RV dysfunction, preoperative moderate to severe tricuspid regurgitation  Patient had significant RV dysfunction and sternum was left open to maintain adequate hemodynamics.  During the night after surgeryThe patient developed  further signs of right ventricular dysfunction and was taken emergently back for mediastinal reexploration.  A clot was found compressing the right atrium from oozing from the pericardium where adhesions had been taken down at the time of surgery.  The patient returned to the operating room on June 20 for delayed sternal closure.  . Chronic atrial fibrillation. INR therapeutic.  Heparin off  Postop VT now resolved for > 2weeks on amiodarone  Patient extubated 6/27 and stood at side of bed.  Chest x-ray shows significant improved with volume removal and aggressive diuresis.Able to get OOB to chair now with PT.  Still using lift. Antibiotics off  Swallow study performed and pt on dysphagia diet Patient more engaged and getting stronger.  VAD parameters saisfactory, RV function improving, co-ox good on milrinone, weaning milrinone slowly   Surgical incisions clean and dry.  Temporary pacing wires have been removed.  LVAD INTERROGATION:  HeartMate II LVAD:  Flow 4 liters/min, speed 5200, power 3.8, PI 2.8.  Controller intact.   Objective:    Vital Signs:   Temp:  [95.1 F (35.1 C)-98.5 F (36.9 C)] 96.2 F (35.7 C) (07/03 0800) Pulse Rate:  [30-121] 78 (07/03 0600) Resp:  [18-35] 26 (07/03 1000) BP: (73-159)/(51-141) 83/66 (07/03 0900) SpO2:  [88 %-100 %] 98 % (07/03 1000) Weight:  [222 lb 0.1 oz (100.7 kg)] 222 lb 0.1 oz (100.7 kg) (07/03 0415) Last BM Date: 08/31/17 Mean arterial Pressure 75-80  mmHg  Intake/Output:   Intake/Output Summary (Last 24 hours) at 09/01/2017 1054 Last data filed at 09/01/2017 1000 Gross per 24 hour  Intake 983.47 ml  Output 1100 ml  Net -116.53 ml     Physical Exam: General:  Well appearing.  responsive HEENT: normal Neck: supple. JVP . Carotids 2+ bilat; no bruits. No lymphadenopathy or thryomegaly appreciated. Cor: Mechanical heart sounds with LVAD hum present. Lungs: clear Abdomen: soft, nontender, nondistended. No hepatosplenomegaly. No bruits or masses. Good bowel sounds. Extremities: no cyanosis, clubbing, rash, edema Neuro: alert & orientedx3, cranial nerves grossly intact. moves all 4 extremities w/o difficulty. Affect pleasant  Telemetry: native rhythm afib  Labs: Basic Metabolic Panel: Recent Labs  Lab 08/27/17 0307  08/28/17 0359 08/28/17 0740 08/29/17 0400 08/30/17 0340 08/31/17 0358 09/01/17 0400  NA 137   < > 132* 138 137 140 141 144  K 3.5   < > NOT DONE 3.3* 4.1 4.1 3.6 4.4  CL 92*   < > 89* 93* 94* 102 111 115*  CO2 34*   < > 32 32 30 28 24 22   GLUCOSE 188*   < > 169* 57* 163* 117* 117* 91  BUN 57*   < > 60* 59* 58* 45* 38* 38*  CREATININE 1.38*   < > 1.48* 1.47* 1.67* 1.39* 1.30* 1.31*  CALCIUM 9.2   < > 9.0 9.6 9.2 9.3 9.0 9.2  MG 2.2  --  2.4  --  2.6* 2.5* 2.3 2.3  PHOS 4.5  --  4.0  --   --   --   --   --    < > =  values in this interval not displayed.    Liver Function Tests: Recent Labs  Lab 08/28/17 0740 08/29/17 0400 08/30/17 0340 08/31/17 0358 09/01/17 0400  AST 253* 133* 75* 48* 37  ALT 177* 130* 93* 73* 59*  ALKPHOS 92 83 78 73 79  BILITOT 2.0* 1.8* 1.5* 1.7* 1.4*  PROT 7.3 7.2 7.5 7.4 7.6  ALBUMIN 2.3* 2.3* 2.4* 2.3* 2.4*   No results for input(s): LIPASE, AMYLASE in the last 168 hours. No results for input(s): AMMONIA in the last 168 hours.  CBC: Recent Labs  Lab 08/26/17 0402 08/27/17 0307 08/28/17 0359 08/31/17 0358 09/01/17 0829  WBC 14.7* 13.7* 15.9* 12.7* 12.6*  HGB 8.8*  8.9* 9.4* 9.2* 9.4*  HCT 27.6* 28.5* 30.4* 30.2* 30.8*  MCV 83.4 83.3 86.4 84.6 84.8  PLT 373 379 411* 547* 564*    INR: Recent Labs  Lab 08/28/17 0359 08/29/17 0400 08/30/17 0340 08/31/17 0358 09/01/17 0400  INR 2.63 2.87 2.79 2.67 2.61    Other results:  EKG:   Imaging: Dg Chest 2 View  Result Date: 08/31/2017 CLINICAL DATA:  Left ventricular assist device. EXAM: CHEST - 2 VIEW COMPARISON:  Radiograph August 30, 2017. FINDINGS: Stable cardiomegaly. Left ventricular assist device is unchanged in position. Left-sided pacemaker is unchanged in position. Right-sided PICC line is noted with distal tip in right atrium. No pneumothorax is noted. Stable bilateral lung opacities are noted concerning for edema or possibly pneumonia. Bony thorax is unremarkable. IMPRESSION: Left ventricular assist device unchanged in position. Stable bilateral lung opacities as described above. Electronically Signed   By: Marijo Conception, M.D.   On: 08/31/2017 09:10     Medications:     Scheduled Medications: . amiodarone  200 mg Oral BID  . aspirin  81 mg Oral Daily  . bisacodyl  10 mg Oral Daily   Or  . bisacodyl  10 mg Rectal Daily  . Chlorhexidine Gluconate Cloth  6 each Topical Daily  . collagenase   Topical Daily  . insulin aspart  0-24 Units Subcutaneous Q4H  . insulin detemir  30 Units Subcutaneous QHS  . levalbuterol  0.63 mg Nebulization TID  . mouth rinse  15 mL Mouth Rinse BID  . metoCLOPramide (REGLAN) injection  10 mg Intravenous Q6H  . potassium chloride  40 mEq Oral BID  . sodium chloride flush  10-40 mL Intracatheter Q12H  . spironolactone  25 mg Oral Daily  . Warfarin - Pharmacist Dosing Inpatient   Does not apply q1800    Infusions: . sodium chloride 10 mL/hr at 09/01/17 1000  . albumin human    . famotidine (PEPCID) IV Stopped (09/01/17 1443)  . lactated ringers Stopped (08/19/17 1602)  . milrinone 0.25 mcg/kg/min (09/01/17 1000)    PRN Medications: acetaminophen  (TYLENOL) oral liquid 160 mg/5 mL, clonazepam, hydrALAZINE, lactated ringers, ondansetron (ZOFRAN) IV, oxyCODONE, RESOURCE THICKENUP CLEAR, sodium chloride flush   Assessment:  Mixed ischemic and nonischemic cardiomyopathy with cardiac amyloid disease Preoperative moderate to severe RV dysfunction as well as moderate to severe tricuspid regurgitation Stable after delayed sternal closure  Plan/Discussion:   sloww progress from delayed chest closure for RV dysfunction, delayed extubation due to volume overlaod, postop VT requiring [facility]  Patient now showing significant improvement in his overall strength and interaction with caretakers.  Tolerating dysphagia 2 diet.  Continues to show daily improvement. CXR improving with diuresis and mobilization  Patient needs daily physical therapy and encouragement. Appears to be appropriate candidate for CIR when he  gets stronger. I reviewed the LVAD parameters from today, and compared the results to the patient's prior recorded data.  No programming changes were made.  The LVAD is functioning within specified parameters.  The patient performs LVAD self-test daily.  LVAD interrogation was negative for any significant power changes, alarms or PI events/speed drops.  LVAD equipment check completed and is in good working order.  Back-up equipment present.   LVAD education done on emergency procedures and precautions and reviewed exit site care.  Length of Stay: [place] [facility] 09/01/2017, 10:54 AM

## 2017-09-01 NOTE — Progress Notes (Addendum)
Patient ID: [name] [name], male   DOB: Mar 18, 1950, 67 y.o.   MRN: [id number]     Advanced Heart Failure Rounding Note  PCP-Cardiologist: No primary care provider on file.   Subjective:    Events 6/17 Underwent HM-3 implant -> Unable to close chest with RV failure and high intrathoracic pressure 6/18 Taken back to OR for evacuation of hematoma. NO RVAD needed. Left sternum open  08/19/17 Taken back to OR for sternal closure 6/22 VT => amiodarone started.  6/24 VT/VF=>urgent cardioversion. 6/25 VT  =>urgent cardioversion  6/27 Extubated  Coox 57.6% on milrinone 0.25. Off Epi and NE. All tubes out.   CVP 9-10  Awake and alert this am. More interactive. Asking for water. Remains on Honey-thick diet. Denies SOB or pain. Volume status much improved. Remains in AF.   LVAD Interrogation HM 3: Speed: 5200 Flow: 4.3 PI: 2.9 Power: 4.0.   Objective:   Weight Range: 222 lb 0.1 oz (100.7 kg) Body mass index is 30.96 kg/m.   Vital Signs:   Temp:  [95.1 F (35.1 C)-98.5 F (36.9 C)] 97.5 F (36.4 C) (07/03 0000) Pulse Rate:  [30-127] 78 (07/03 0600) Resp:  [17-35] 25 (07/03 0600) BP: (73-159)/(51-141) 83/54 (07/03 0800) SpO2:  [88 %-100 %] 99 % (07/03 0600) Weight:  [222 lb 0.1 oz (100.7 kg)] 222 lb 0.1 oz (100.7 kg) (07/03 0415) Last BM Date: 08/31/17  Weight change: Filed Weights   08/30/17 0435 08/31/17 0500 09/01/17 0415  Weight: 229 lb 8 oz (104.1 kg) 227 lb 15.3 oz (103.4 kg) 222 lb 0.1 oz (100.7 kg)   Intake/Output:   Intake/Output Summary (Last 24 hours) at 09/01/2017 0816 Last data filed at 09/01/2017 0500 Gross per 24 hour  Intake 283.44 ml  Output 1250 ml  Net -966.56 ml   Physical Exam   MAPs 60-70s this am.  General: Weak appearing. NAD. Hoarse.  HEENT: Normal. anicteric Neck: Supple, JVP ~9 cm. Carotids OK.  Cardiac:  Mechanical heart sounds with LVAD hum present.  Lungs:  CTAB, normal effort. No wheeze Abdomen:  Obese, NT, ND, no HSM. No bruits or  masses. +BS  LVAD exit site: Dressing dry and intact. No erythema or drainage. Stabilization device present and accurately applied. Driveline dressing changed daily per sterile technique. Extremities: no cyanosis, clubbing, rash, edema Neuro: alert & oriented x 3, cranial nerves grossly intact. moves all 4 extremities w/o difficulty. Affect pleasant   Telemetry   Afib 70-80s with PVCs, personally reviewed.   Labs    CBC Recent Labs    08/31/17 0358  WBC 12.7*  HGB 9.2*  HCT 30.2*  MCV 84.6  PLT 614*   Basic Metabolic Panel Recent Labs    08/31/17 0358 09/01/17 0400  NA 141 144  K 3.6 4.4  CL 111 115*  CO2 24 22  GLUCOSE 117* 91  BUN 38* 38*  CREATININE 1.30* 1.31*  CALCIUM 9.0 9.2  MG 2.3 2.3   Liver Function Tests Recent Labs    08/31/17 0358 09/01/17 0400  AST 48* 37  ALT 73* 59*  ALKPHOS 73 79  BILITOT 1.7* 1.4*  PROT 7.4 7.6  ALBUMIN 2.3* 2.4*   No results for input(s): LIPASE, AMYLASE in the last 72 hours. Cardiac Enzymes No results for input(s): CKTOTAL, CKMB, CKMBINDEX, TROPONINI in the last 72 hours.  BNP: BNP (last 3 results) Recent Labs    08/17/17 0314 08/23/17 0024 08/30/17 0000  BNP 266.4* 450.5* 114.6*    ProBNP (last 3  results) Recent Labs    11/12/16 1559  PROBNP 388.0*     D-Dimer No results for input(s): DDIMER in the last 72 hours. Hemoglobin A1C No results for input(s): HGBA1C in the last 72 hours. Fasting Lipid Panel No results for input(s): CHOL, HDL, LDLCALC, TRIG, CHOLHDL, LDLDIRECT in the last 72 hours. Thyroid Function Tests No results for input(s): TSH, T4TOTAL, T3FREE, THYROIDAB in the last 72 hours.  Invalid input(s): FREET3  Other results:   Imaging    No results found.   Medications:     Scheduled Medications: . amiodarone  200 mg Oral BID  . aspirin  81 mg Oral Daily  . bisacodyl  10 mg Oral Daily   Or  . bisacodyl  10 mg Rectal Daily  . Chlorhexidine Gluconate Cloth  6 each Topical  Daily  . collagenase   Topical Daily  . insulin aspart  0-24 Units Subcutaneous Q4H  . insulin detemir  30 Units Subcutaneous QHS  . levalbuterol  0.63 mg Nebulization TID  . mouth rinse  15 mL Mouth Rinse BID  . metoCLOPramide (REGLAN) injection  10 mg Intravenous Q6H  . potassium chloride  40 mEq Oral BID  . sodium chloride flush  10-40 mL Intracatheter Q12H  . spironolactone  25 mg Oral BID  . Warfarin - Pharmacist Dosing Inpatient   Does not apply q1800    Infusions: . sodium chloride Stopped (08/31/17 1035)  . famotidine (PEPCID) IV Stopped (08/31/17 2151)  . lactated ringers Stopped (08/19/17 1602)  . milrinone 0.25 mcg/kg/min (09/01/17 0500)    PRN Medications: acetaminophen (TYLENOL) oral liquid 160 mg/5 mL, clonazepam, hydrALAZINE, lactated ringers, ondansetron (ZOFRAN) IV, oxyCODONE, RESOURCE THICKENUP CLEAR, sodium chloride flush    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   Directly admitted with persistent low cardiac output for milrinone initiation for home.  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 Acute/Chronic systolic CHF with biventricular failure-> cardiogenic shock: Echo  08/13/2017 EF 20-25%. s/p  Medtronic BiV ICD in place. Cath 12/18 with stable 1v CAD.  s/p HM-3 implant 6/17. Unable to close chest due to high-intrathoracic pressures and RV failure. Taken back to OR 08/17/17 with evacuation of hematoma with improvement.   - Extubated 6/28  - Coox 57.6% on milrinone 0.25 mcg/kg/min. Off Epi and NE.  - CVP ~9-10. Weight down another 5 lbs (52 lbs in one week).  - MAPs 60s this am. Will give 500cc of IVF and cut back spiro to once daily.  - Continue to mobilize as tolerated.  2. VAD - s/p HM-3 implant 6/17  - VAD interrogated personally. Parameters stable.   - INR 2.61.  Discussed dosing with PharmD personally.  - Continue asa 81 mg daily.  - LDH 245 3. Acute respiratory failure post-op - Discussion as above. Stable.  4. Acute on CKD II-III:  -Creatinine 1.31 today.  5. [name]/VF: Recurrent VT on 6/22 - S/P VF 6/24 emergent bedside cardioversion.  - S/P VT 6/26 emergent bedside cardioversion.  - VT Quiescent.  Continue amiodarone gtt.  - Keep K > 4.0 Mg > 2.0.  6. AFL - S/p previous ablation. Rate controlled. Well-tolerated.  7. [name]: Recent cath with stable CAD as above - No  change to current plan.   8. [name]: Recent A1c 8.3 on 6/3.  - Have adjusted SSI with low sugars. 9. Anemia:  - Post-op.  Got 1 unit PRBCs 6/22 and 6/25. - Hgb 9.2 08/31/17. 10. Fevers  - Afebrile. Cx negative.  - Remains on meropenem. Can likely stop soon.  11. Deconditioning - Severe. Need to mobilize with PT. CIR consult placed.  12. Dysphagia - Progressing. Appreciate speech.  13. Lethargy/delirium - Much improved with med adjustments.   Length of Stay: [place], [place]  09/01/2017, 8:16 AM  Advanced Heart Failure Team Pager [phone] (M-F; 7a - 4p)  Please contact [facility] for night-coverage after hours (4p -7a ) and weekends on [email or web address]  Patient seen and examined with the above-signed Advanced Practice Provider and/or Housestaff. I personally reviewed laboratory data, imaging studies and relevant notes. I independently examined the patient and formulated the important aspects of the plan. I have edited the note to reflect any of my changes or salient points. I have personally discussed the plan with the patient and/or family.  Continues to have a slow recovery course after VAD placement. Very weak. Remains on milrinone but co-ox improved. Can stop today. MAPs are low and weight down over 52 pounds. Will give back 500cc NS today. Remains in AF (which is chronic). Rate controlled. INR 2.6. Discussed dosing with PharmD  personally. Tolerating honey-think diet. VAD interrogated personally. Parameters stable.  Will continue to mobilize. Plan Ramp Echo on Friday.   Glori Bickers, MD  7:24 PM

## 2017-09-01 NOTE — Progress Notes (Signed)
Physical Therapy Treatment Patient Details Name: [name] [name] MRN: [id number] DOB: [birth year] Today's Date: 09/01/2017    History of Present Illness Pt is a 67 y.o. male admitted 08/11/17 for milrinone s/p LVAD HM3 placement 6/18; return to OR same day for hematoma evacuation with delayed closure until 7/67, course complicated by cardiogenic shock, biventricular failure, cardioversions, VDRF. Extubated 6/28. PMhx: chronic systolic CHF due to ICM, [name], CAD, PAD, [name], HTN, HLD, [name]   PT Comments    Pt progressing with mobility. ModA+2 for bed mobility, siting EOB with min guard for balance. MaxA+2 to stand and take pivotal steps to chair; pt required maxA and multimodal cues to maintain sternal precautions throughout. More interactive this session, although continues to demonstrate slowed processing, difficulty sequencing, and poor insight into deficits/precautions. VSS stable throughout. Recommend intensive CIR-level therapies to maximize functional mobility and independence prior to return home.    Follow Up Recommendations  CIR;Supervision/Assistance - 24 hour     Equipment Recommendations  Rolling walker with 5" wheels;3in1 (PT)    Recommendations for Other Services       Precautions / Restrictions Precautions Precautions: Sternal;Fall Precaution Comments: constant cues needed for sternal precautions    Mobility  Bed Mobility Overal bed mobility: Needs Assistance Bed Mobility: Supine to Sit     Supine to sit: Mod assist;+2 for physical assistance     General bed mobility comments: mod assist for LEs to EOB and to raise trunk, max verbal cues to avoid use of arms to adhere to sternal precautions  Transfers Overall transfer level: Needs assistance Equipment used: 2 person hand held assist Transfers: Sit to/from Stand;Stand Pivot Transfers Sit to Stand: Max assist;+2 physical assistance Stand pivot transfers: Max assist;+2 physical  assistance       General transfer comment: assist to rise and for support as pt took pivotal steps to chair. Pt flexing forward with fatigue with max cues and assist to maintain upright posture in order to pivot  Ambulation/Gait                 Stairs             Wheelchair Mobility    Modified Rankin (Stroke Patients Only)       Balance Overall balance assessment: Needs assistance   Sitting balance-Leahy Scale: Fair Sitting balance - Comments: min guard assist at EOB     Standing balance-Leahy Scale: Poor Standing balance comment: stood for several seconds with head up and +2 max assist, fatigues easily resulting in flexion with transfer                            Cognition Arousal/Alertness: Awake/alert Behavior During Therapy: Flat affect Overall Cognitive Status: Impaired/Different from baseline Area of Impairment: Memory;Attention;Following commands;Safety/judgement;Problem solving                   Current Attention Level: Sustained Memory: Decreased recall of precautions;Decreased short-term memory Following Commands: Follows one step commands inconsistently;Follows one step commands with increased time Safety/Judgement: Decreased awareness of safety;Decreased awareness of deficits   Problem Solving: Slow processing;Decreased initiation;Difficulty sequencing;Requires verbal cues;Requires tactile cues General Comments: pt looking at his LVAD controller around his neck and asking "what is this?". Repeating "ice cream" throughout session after OT mentioned ice cream on tray to see if pt hungry      Exercises      General Comments  Pertinent Vitals/Pain Pain Assessment: Faces Faces Pain Scale: Hurts even more Pain Location: buttocks Pain Descriptors / Indicators: Sore Pain Intervention(s): Monitored during session;Repositioned    Home Living                      Prior Function            PT Goals  (current goals can now be found in the care plan section) Acute Rehab PT Goals Patient Stated Goal: eat ice cream PT Goal Formulation: With patient Time For Goal Achievement: 09/10/17 Potential to Achieve Goals: Good Progress towards PT goals: Progressing toward goals    Frequency    Min 3X/week      PT Plan Current plan remains appropriate    Co-evaluation PT/OT/SLP Co-Evaluation/Treatment: Yes Reason for Co-Treatment: Necessary to address cognition/behavior during functional activity;For patient/therapist safety;Complexity of the patient's impairments (multi-system involvement) PT goals addressed during session: Mobility/safety with mobility;Balance OT goals addressed during session: Strengthening/ROM;ADL's and self-care      AM-PAC PT "6 Clicks" Daily Activity  Outcome Measure  Difficulty turning over in bed (including adjusting bedclothes, sheets and blankets)?: Unable Difficulty moving from lying on back to sitting on the side of the bed? : Unable Difficulty sitting down on and standing up from a chair with arms (e.g., wheelchair, bedside commode, etc,.)?: Unable Help needed moving to and from a bed to chair (including a wheelchair)?: A Lot Help needed walking in hospital room?: Total Help needed climbing 3-5 steps with a railing? : Total 6 Click Score: 7    End of Session Equipment Utilized During Treatment: Gait belt;Oxygen Activity Tolerance: Patient tolerated treatment well;Patient limited by fatigue Patient left: in chair;with call bell/phone within reach;Other (comment)(with lift pad underneath) Nurse Communication: Mobility status;Need for lift equipment PT Visit Diagnosis: Unsteadiness on feet (R26.81);Other abnormalities of gait and mobility (R26.89);Muscle weakness (generalized) (M62.81)     Time: 1330-1400 PT Time Calculation (min) (ACUTE ONLY): 30 min  Charges:  $Therapeutic Activity: 8-22 mins                    G Codes:      Mabeline Caras, PT,  DPT Acute Rehab Services  Pager: [place] 09/01/2017, 5:33 PM

## 2017-09-01 NOTE — Progress Notes (Signed)
So good to hear [name] [name]' voice today.  Stopped by to visit with him and his lovely wife while rounding the floor.  Had prayer with them to support their faith and source of strength which is always welcomed by them.  Thankful for the care he is receiving.  Will continue to follow as needed.    09/01/17 1026  Clinical Encounter Type  Visited With Patient and family together;Health care provider  Visit Type Follow-up;Spiritual support  Spiritual Encounters  Spiritual Needs Prayer

## 2017-09-01 NOTE — Progress Notes (Signed)
CSW met at bedside with patient and multiple family members. Patient sitting up in chair and in good spirits. Patient's family pleased with progress. Patient appears much clearer today and wife states she has had some good rest. Wife reports patient assessed for rehab and she is hopeful for rehab stay to increase his strength. CSW provided supportive intervention and will continue to follow throughout implant hospitalization. Raquel Sarna, [place], [place]

## 2017-09-01 NOTE — PMR Pre-admission (Signed)
PMR Admission Coordinator Pre-Admission Assessment  Patient: [name] [name] is an 67 y.o., male MRN: [id number] DOB: 13-Dec-1950 Height: '5\' 11"'  (180.3 cm) Weight: 97.8 kg (215 lb 8 oz)              Insurance Information HMO:     PPO: Yes     PCP:      IPA:      80/20:      OTHER:  PRIMARY: [employer]      Policy#: [id number]      Subscriber: Patient CM Name: [name] [name]      Phone#: [phone]     Fax#: [phone] Clinicals updates are due on day 7 (admit date 09/10/17; day 7 is 09/17/17) to [name] [name].  Pre-Cert#: Auth # [id number]      Employer:  Benefits:  Phone #: [phone]     Name: Online Portal  Eff. Date: 03/02/2017     Deduct: $0      Out of Pocket Max: $3,400 (met $595)      Life Max: NA CIR: $295/day for days 1-6; $0/day for days 7+      SNF: $20/day for days 1-20; $160/day for days 21-100 (SNF day limit: 100 days) Outpatient: per necessity     Co-Pay: $15/visit Home Health: per necessity; 100%      Co-Pay:  DME: 80%     Co-Pay: 20% Providers:   Medicaid Application Date:       Case Manager:  Disability Application Date:       Case Worker:   Emergency Facilities manager Information    Name Relation Home Work [name] [name] Spouse [phone]     Current Medical History  Patient Admitting Diagnosis: Debility History of Present Illness: [name] [name] is a 67 y.o. male with past medical history of ventricular tachycardia, ischemic cardiomyopathy status post ICD, hypertension, diabetes mellitus type 2 with retinopathy, CKD, chronic systolic congestive heart failure, CAD, atrial flutter presented on 08/11/2017 due to CHF.  History taken from chart review.  Patient was admitted with plans for home milrinone initiation.  He has noted to be fluid overloaded and ultimately had an LVAD placed on 6/17.  Hospital course complicated by cardiogenic shock (requiring pressors), respiratory failure, V. tach/V. Fib, tachypnea,  leukocytosis, acute blood loss anemia. Mirinone stopped on 7/10. Pt currently has an unstageable pressure ulcer on R and L buttock with necrosis on R buttock. Hydrotherapy has been ordered. Therapies have seen pt with recommendations for CIR. Pt is to be admitted to CIR on 09/10/17.       Past Medical History  Past Medical History:  Diagnosis Date  . AICD (automatic cardioverter/defibrillator) present 02/05/2014   Upgrade to Medtronic biventricular ICD, serial number  [id number]   . Atrial flutter ([place]) 04/2012   s/p TEE-EPS+RFCA 04/2012  . CAD (coronary artery disease) 2563,8937 X 2    RCA-T, 70% PL (off CFX), 99% Prox LAD/90% Dist LAD, S/P TAXUS stent x 2  . CHF (congestive heart failure) ([place])   . Chronic anticoagulation   . Chronic systolic heart failure ([place])   . CKD (chronic kidney disease)   . Diabetic retinopathy ([place])   . DM type 2 (diabetes mellitus, type 2) ([facility])    insulin dependent  . HTN (hypertension)   . Hypercholesteremia    ablation  . ICD (implantable cardiac defibrillator) in place   . Ischemic cardiomyopathy March 2015   20-25% 2D   . Nephrolithiasis   .  Ventricular tachycardia ([place])     Family History  family history includes Alzheimer's disease in his mother; CAD in his brother, brother, brother, and brother; Diabetes in his brother; Healthy in his daughter and son; Heart attack in his mother; Heart failure in his father; Other in his brother; Prostate cancer in his brother.  Prior Rehab/Hospitalizations:  Has the patient had major surgery during 100 days prior to admission? Yes, cardiac cath  Current Medications   Current Facility-Administered Medications:  .  0.9 %  sodium chloride infusion, , Intravenous, Continuous, Prescott Gum, Collier Salina, MD, Last Rate: 10 mL/hr at 09/06/17 1031 .  acetaminophen (TYLENOL) solution 650 mg, 650 mg, Per Tube, Q4H PRN, Bensimhon, Shaune Pascal, MD .  aspirin chewable tablet 81 mg, 81 mg, Oral, Daily, Clegg, Amy D, NP, 81 mg at  09/09/17 0909 .  bisacodyl (DULCOLAX) EC tablet 10 mg, 10 mg, Oral, Daily, 10 mg at 09/08/17 1042 **OR** bisacodyl (DULCOLAX) suppository 10 mg, 10 mg, Rectal, Daily, Prescott Gum, Collier Salina, MD, 10 mg at 09/04/17 1132 .  Chlorhexidine Gluconate Cloth 2 % PADS 6 each, 6 each, Topical, QHS, Bensimhon, Shaune Pascal, MD, 6 each at 09/09/17 2139 .  clonazePAM (KLONOPIN) disintegrating tablet 0.25 mg, 0.25 mg, Oral, BID PRN, Bensimhon, Shaune Pascal, MD, 0.25 mg at 08/31/17 0137 .  collagenase (SANTYL) ointment, , Topical, Daily, Prescott Gum, Collier Salina, MD .  famotidine (PEPCID) tablet 20 mg, 20 mg, Oral, BID, Bensimhon, Shaune Pascal, MD, 20 mg at 09/09/17 2138 .  feeding supplement (ENSURE ENLIVE) (ENSURE ENLIVE) liquid 237 mL, 237 mL, Oral, BID BM, Bensimhon, Shaune Pascal, MD, 237 mL at 09/09/17 1730 .  hydrALAZINE (APRESOLINE) injection 10 mg, 10 mg, Intravenous, Q4H PRN, Bensimhon, Shaune Pascal, MD, 10 mg at 09/01/17 0424 .  insulin aspart (novoLOG) injection 0-15 Units, 0-15 Units, Subcutaneous, TID WC, Clegg, Amy D, NP, 2 Units at 09/09/17 1730 .  insulin aspart (novoLOG) injection 0-5 Units, 0-5 Units, Subcutaneous, QHS, Clegg, Amy D, NP .  insulin detemir (LEVEMIR) injection 25 Units, 25 Units, Subcutaneous, QHS, Clegg, Amy D, NP, 25 Units at 09/09/17 2137 .  lactated ringers infusion, , Intravenous, Continuous PRN, Prescott Gum, Collier Salina, MD, Stopped at 08/19/17 1602 .  levalbuterol (XOPENEX) nebulizer solution 0.63 mg, 0.63 mg, Nebulization, Q6H PRN, Bensimhon, Shaune Pascal, MD .  magnesium oxide (MAG-OX) tablet 400 mg, 400 mg, Oral, BID, Clegg, Amy D, NP, 400 mg at 09/09/17 2138 .  MEDLINE mouth rinse, 15 mL, Mouth Rinse, BID, Bensimhon, Shaune Pascal, MD, 15 mL at 09/09/17 2139 .  nystatin (MYCOSTATIN) 100000 UNIT/ML suspension 500,000 Units, 5 mL, Oral, QID, Skeet Latch, MD, 500,000 Units at 09/09/17 2138 .  ondansetron (ZOFRAN) injection 4 mg, 4 mg, Intravenous, Q6H PRN, Prescott Gum, Collier Salina, MD, 4 mg at 08/27/17 0752 .  oxyCODONE (Oxy  IR/ROXICODONE) immediate release tablet 5 mg, 5 mg, Oral, Q6H PRN, Prescott Gum, Collier Salina, MD, 5 mg at 09/10/17 0813 .  potassium chloride (KLOR-CON) packet 40 mEq, 40 mEq, Oral, Daily, Clegg, Amy D, NP, 40 mEq at 09/09/17 1019 .  RESOURCE THICKENUP CLEAR, , Oral, PRN, Magdalen Spatz, NP .  sodium chloride flush (NS) 0.9 % injection 10-40 mL, 10-40 mL, Intracatheter, Q12H, Prescott Gum, Collier Salina, MD, 20 mL at 09/10/17 0538 .  sodium chloride flush (NS) 0.9 % injection 10-40 mL, 10-40 mL, Intracatheter, PRN, Prescott Gum, Collier Salina, MD .  spironolactone (ALDACTONE) tablet 25 mg, 25 mg, Oral, Daily, Bensimhon, Shaune Pascal, MD, 25 mg at 09/09/17 0909 .  warfarin (COUMADIN) tablet 5 mg, 5 mg, Oral, ONCE-1800, Lyndee Leo, RPH .  Warfarin - Pharmacist Dosing Inpatient, , Does not apply, q1800, Bensimhon, Shaune Pascal, MD  Patients Current Diet:  Diet Order           Diet - low sodium heart healthy        DIET DYS 2 Room service appropriate? Yes; Fluid consistency: Honey Thick  Diet effective now          Precautions / Restrictions Precautions Precautions: Sternal, Fall Precaution Comments: LVAD, sacral wound Restrictions Weight Bearing Restrictions: Yes   Has the patient had 2 or more falls or a fall with injury in the past year?Yes, broke ankle this past January   Prior Activity Level Community (5-7x/wk): Drives, semi-retired  Development worker, international aid / [place] Devices/Equipment: Blood pressure cuff, CBG Meter Home Equipment: Shower seat  Prior Device Use: Indicate devices/aids used by the patient prior to current illness, exacerbation or injury? None of the above  Prior Functional Level Prior Function Level of Independence: Independent Comments: drives, cooks, works as a Psychologist, occupational Care: Did the patient need help bathing, dressing, using the toilet or eating?  Independent  Indoor Mobility: Did the patient need assistance with walking from room to room (with or without  device)? Independent  Stairs: Did the patient need assistance with internal or external stairs (with or without device)? Independent  Functional Cognition: Did the patient need help planning regular tasks such as shopping or remembering to take medications? Independent  Current Functional Level Cognition  Overall Cognitive Status: Impaired/Different from baseline Current Attention Level: Alternating Orientation Level: Oriented X4 Following Commands: Follows one step commands consistently Safety/Judgement: Decreased awareness of deficits General Comments: pt able to recall only 1 precaution, educateed for all, able to process power transition and after mod assist for initial transition to battery only required min cues to return to main power    Extremity Assessment (includes Sensation/Coordination)  Upper Extremity Assessment: Generalized weakness  Lower Extremity Assessment: Defer to PT evaluation    ADLs  Overall ADL's : Needs assistance/impaired Eating/Feeding: Minimal assistance, Sitting Eating/Feeding Details (indicate cue type and reason): able to drink from cup and use spoon with magic cup with cues for small bite size and to tuck his chin with swallowing Grooming: Sitting, Set up(blew nose) Upper Body Bathing: With caregiver independent assisting, Sitting, Bed level Lower Body Bathing: Total assistance, Bed level Upper Body Dressing : Total assistance, Sitting Lower Body Dressing: Total assistance, Bed level Toilet Transfer: Minimal assistance, +2 for physical assistance, Ambulation, RW Toilet Transfer Details (indicate cue type and reason): Mod A +2 sit<>stand, min A to ambulate wiht RW Toileting- Clothing Manipulation and Hygiene: Total assistance Toileting - Clothing Manipulation Details (indicate cue type and reason): min A +2 sit<>stand Functional mobility during ADLs: Total assistance, +2 for physical assistance, Maximal assistance General ADL Comments: Pt able to  transition to/from battery/wall power with Max cues and min A for correct placement of connection ("half moons together) as well as increased time.    Mobility  Overal bed mobility: Needs Assistance Bed Mobility: Rolling, Sidelying to Sit Rolling: Min assist Sidelying to sit: Min assist Supine to sit: Mod assist, +2 for physical assistance Sit to sidelying: Min assist General bed mobility comments: cues for sequence with assist to elevate trunk    Transfers  Overall transfer level: Needs assistance Equipment used: 2 person hand held assist(Eva walker) Transfer via Lift Equipment: [place] Transfers: Sit to/from  Stand Sit to Stand: Mod assist Stand pivot transfers: Mod assist General transfer comment: mod assist for anterior translation and rise to stand from bed and chair x 3 trials with cues for hands on thighs, min assist to control descent     Ambulation / Gait / Stairs / Wheelchair Mobility  Ambulation/Gait Ambulation/Gait assistance: Min assist, +2 safety/equipment Gait Distance (Feet): 85 Feet Assistive device: Rolling walker (2 wheeled) Gait Pattern/deviations: Step-through pattern, Decreased stride length, Trunk flexed General Gait Details: cues for posture, looking up and increased stride with encouragement to advance distance. Pt walked 74' then 33' with seated rest between trials grossly 2 min Gait velocity: decreased Gait velocity interpretation: <1.31 ft/sec, indicative of household ambulator    Posture / Balance Dynamic Sitting Balance Sitting balance - Comments: supervision EOB Balance Overall balance assessment: Needs assistance Sitting-balance support: No upper extremity supported Sitting balance-Leahy Scale: Good Sitting balance - Comments: supervision EOB Postural control: Posterior lean, Right lateral lean Standing balance support: Bilateral upper extremity supported Standing balance-Leahy Scale: Poor Standing balance comment: bil UE support in standing     Special needs/care consideration BiPAP/CPAP: No CPM: No Continuous Drip IV: No Dialysis: No        Days Life Vest: Pt has a newly placed LVAD Oxygen: No, Room Air Special Bed: air overlay for sacral wound Trach Size: No Wound Vac (area): no      Location NA Skin: Unstagable pressure ulcer to R and L buttock (necrosis noted on R buttock); needs for repositioning hourly when in chair (may need different chair cushion). Catheter entry and exit on abdomen; skin tear along L upper lateral back                     Location Bowel mgmt: Last BM 09/09/17 Bladder mgmt:: continent, using urinal at times; condom cath at times Diabetic mgmt: yes, wife reports use of oral medicine and injections     Previous Home Environment Living Arrangements: Spouse/significant other Type of Home: House Bathroom Toilet: [place]: Yes Type of Home Care Services: Other (Comment)(Paramedicine program w/HF clinic)  Discharge Living Setting Plans for Discharge Living Setting: Patient's home, Lives with (comment)(lives with wife and son lives there periodically) Type of Home at Discharge: House Discharge Home Layout: One level Discharge Home Access: Stairs to enter Entrance Stairs-Rails: None Entrance Stairs-Number of Steps: 3 Discharge Bathroom Shower/Tub: Tub/shower unit(with shower stool; HH shower, and 1 grab bar) Discharge Bathroom Toilet: Handicapped height Discharge Bathroom Accessibility: Yes How Accessible: Accessible via walker Does the patient have any problems obtaining your medications?: Yes (Describe)(was in donut hole when purching Eliquis)  Social/Family/Support Systems Patient Roles: Spouse, Parent Contact Information: wife is emergency contact Anticipated Caregiver: wife (as wlel as children-1 lives in [place]; Psychiatrist) Anticipated Caregiver's Contact Information: (Wife: [name]) cell: [phone] Home: [phone] Ability/Limitations of Caregiver: wife can  provide up to Mod A based on her perception of what Mod A is from [facility] description  Caregiver Availability: 24/7 Discharge Plan Discussed with Primary Caregiver: Yes Is Caregiver In Agreement with Plan?: Yes Does Caregiver/Family have Issues with Lodging/Transportation while Pt is in Rehab?: No   Goals/Additional Needs Patient/Family Goal for Rehab: PT/OT: Min/Mod A; SLP: Supervision Expected length of stay: 18-22 days Cultural Considerations: NA Dietary Needs: DYS 2; Honey thick liquids Equipment Needs: TBD Special Service Needs: LVAD care Additional Information: therpay and nursing to assist with management of new LVAD Pt/Family Agrees to Admission and willing to participate: Yes(pt, wife, daughter)  Program Orientation Provided & Reviewed with Pt/Caregiver Including Roles  & Responsibilities: Yes(reviewed with pt, wife, and daughter) Additional Information Needs: TBD(TBD)  Barriers to Discharge: Medical stability, Home environment access/layout, Other (comments)  Barriers to Discharge Comments: new LVAD; will need training; family training for transfer techniques   Decrease burden of Care through IP rehab admission: NA   Possible need for SNF placement upon discharge:Not anticipated; Wife feels she can be the 24/7 A in addition to family and friends support; Wife has understanding of anticipated level of assist required at DC.    Patient Condition: This patient's medical and functional status has changed since the consult dated: 08/29/17 in which the Rehabilitation Physician determined and documented that the patient's condition is appropriate for intensive rehabilitative care in an inpatient rehabilitation facility. See "History of Present Illness" (above) for medical update. Functional changes are: Mod A sit to stand transfers, Min A x2 for ambulation 85 feet. Patient's medical and functional status update has been discussed with the Rehabilitation physician and patient remains  appropriate for inpatient rehabilitation. Will admit to inpatient rehab today.  Preadmission Screen Completed By:  Jhonnie Garner, 09/10/2017 10:02 AM ______________________________________________________________________   Discussed status with Dr. Naaman Plummer on 09/10/17 at 10:00AM  and received telephone approval for admission today.  Admission Coordinator:  Jhonnie Garner, time 10:00AM/Date 09/10/17

## 2017-09-01 NOTE — Plan of Care (Signed)
  Problem: Clinical Measurements: Goal: Ability to maintain clinical measurements within normal limits will improve Outcome: Progressing Goal: Respiratory complications will improve Outcome: Progressing Goal: Cardiovascular complication will be avoided Outcome: Progressing   Problem: Activity: Goal: Risk for activity intolerance will decrease Outcome: Progressing Note:  Working with PT/OT to get up to the chair.   Problem: Pain Managment: Goal: General experience of comfort will improve Outcome: Progressing   Problem: Nutrition: Goal: Adequate nutrition will be maintained Outcome: Not Progressing Note:  Poor appetite.

## 2017-09-01 NOTE — Progress Notes (Signed)
  Speech Language Pathology Treatment: Dysphagia  Patient Details Name: [name] [name] MRN: [id number] DOB: [birth year] Today's Date: 09/01/2017 Time: 1700-1749 SLP Time Calculation (min) (ACUTE ONLY): 15 min  Assessment / Plan / Recommendation Clinical Impression  Pt sleepier today; wife stated didn't sleep well last night therefore limited po's given. He continues to have difficulty stating/recalling strategy for chin tuck and needs mod-max assist to perform accurately. No overt indications of decreased airway protection with several sips honey juice. High aspiration risk due to cognitive status and prolonged/difficult intubation. Continue honey thick liquids with chin tuck and max assist/supervision.    HPI HPI: 67 y.o. 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       SLP Plan  Continue with current plan of care       Recommendations  Diet recommendations: Dysphagia 2 (fine chop);Honey-thick liquid Liquids provided via: Cup Medication Administration: Crushed with puree Supervision: Patient able to self feed;Full supervision/cueing for compensatory strategies Compensations: Minimize environmental distractions;Slow rate;Small sips/bites;Chin tuck Postural Changes and/or Swallow Maneuvers: Seated upright 90 degrees                Oral Care Recommendations: Oral care BID Follow up Recommendations: Skilled Nursing facility SLP Visit Diagnosis: Dysphagia, oral phase (R13.11);Dysphagia, pharyngeal phase (R13.13);Dysphagia, pharyngoesophageal phase (R13.14) Plan: Continue with current plan of care       GO                Houston Siren 09/01/2017, 2:36 PM  Orbie Pyo Colvin Caroli.[employer] [phone]

## 2017-09-01 NOTE — Progress Notes (Signed)
Occupational Therapy Treatment Patient Details Name: [name] [name] MRN: [id number] DOB: 08-Jan-1951 Today's Date: 09/01/2017    History of present illness 67 y.o. male admitted 6/12 for milrinone s/p LVAD HM3 placement 6/18 with return to OR same day for hematoma evacuation with delayed closure 0/35, course complicated by cardiogenic shock, biventricular failure, cardioversions, VDRF with extubation 6/28. PMhx: chronic systolic CHF due to ICM, [name], CAD, PAD, [name], HTN, HLD, [name]   OT comments  Pt demonstrating improvement in mobility with ability to achieve sitting EOB with +2 mod assist and transfer to chair with +2 max assist. Pt fatigues easily. Once in chair, pt self fed and drank with min assist and verbal cues for swallowing safety. Pt with recall of any sternal precautions, reeducated. Pt with stable VS throughout session. Will continue to follow.  Follow Up Recommendations  CIR;Supervision/Assistance - 24 hour    Equipment Recommendations  None recommended by OT    Recommendations for Other Services      Precautions / Restrictions Precautions Precautions: Sternal;Fall Precaution Comments: constant cues needed for sternal precautions       Mobility Bed Mobility Overal bed mobility: Needs Assistance Bed Mobility: Supine to Sit     Supine to sit: +2 for physical assistance;Mod assist     General bed mobility comments: mod assist for LEs to EOB and to raise trunk, max verbal cues to avoid use of arms to adhere to sternal precautions  Transfers Overall transfer level: Needs assistance Equipment used: 2 person hand held assist Transfers: Sit to/from Stand;Stand Pivot Transfers Sit to Stand: +2 physical assistance;Max assist Stand pivot transfers: +2 physical assistance;Max assist       General transfer comment: assist to rise and for support as pt took pivotal steps to chair    Balance Overall balance assessment: Needs assistance    Sitting balance-Leahy Scale: Fair Sitting balance - Comments: min guard assist at EOB     Standing balance-Leahy Scale: Poor Standing balance comment: stood for several seconds with head up and +2 max assist, fatigues easily resulting in flexion with transfer                           ADL either performed or assessed with clinical judgement   ADL Overall ADL's : Needs assistance/impaired Eating/Feeding: Minimal assistance;Sitting Eating/Feeding Details (indicate cue type and reason): able to drink from cup and use spoon with magic cup with cues for small bite size and to tuck his chin with swallowing Grooming: Sitting;Set up(blew nose)               Lower Body Dressing: Total assistance;Bed level                       Vision       Perception     Praxis      Cognition Arousal/Alertness: Awake/alert Behavior During Therapy: Flat affect Overall Cognitive Status: Impaired/Different from baseline Area of Impairment: Memory;Attention;Following commands;Safety/judgement;Problem solving                   Current Attention Level: Sustained Memory: Decreased recall of precautions;Decreased short-term memory Following Commands: Follows one step commands inconsistently;Follows one step commands with increased time Safety/Judgement: Decreased awareness of safety;Decreased awareness of deficits   Problem Solving: Slow processing;Decreased initiation;Difficulty sequencing;Requires verbal cues;Requires tactile cues General Comments: pt looking at his LVAD controller around his neck and asking "what is this?"  Exercises     Shoulder Instructions       General Comments      Pertinent Vitals/ Pain       Pain Assessment: Faces Faces Pain Scale: Hurts even more Pain Location: buttocks Pain Descriptors / Indicators: Sore Pain Intervention(s): Monitored during session;Repositioned  Home Living                                           Prior Functioning/Environment              Frequency  Min 3X/week        Progress Toward Goals  OT Goals(current goals can now be found in the care plan section)  Progress towards OT goals: Progressing toward goals  Acute Rehab OT Goals Patient Stated Goal: eat ice cream OT Goal Formulation: With patient/family Time For Goal Achievement: 09/10/17 Potential to Achieve Goals: Good  Plan Discharge plan remains appropriate    Co-evaluation    PT/OT/SLP Co-Evaluation/Treatment: Yes Reason for Co-Treatment: Necessary to address cognition/behavior during functional activity;Complexity of the patient's impairments (multi-system involvement);For patient/therapist safety   OT goals addressed during session: Strengthening/ROM;ADL's and self-care      AM-PAC PT "6 Clicks" Daily Activity     Outcome Measure   Help from another person eating meals?: A Little Help from another person taking care of personal grooming?: A Lot Help from another person toileting, which includes using toliet, bedpan, or urinal?: Total Help from another person bathing (including washing, rinsing, drying)?: Total Help from another person to put on and taking off regular upper body clothing?: Total Help from another person to put on and taking off regular lower body clothing?: Total 6 Click Score: 9    End of Session Equipment Utilized During Treatment: Gait belt;Oxygen  OT Visit Diagnosis: Unsteadiness on feet (R26.81);Cognitive communication deficit (R41.841)   Activity Tolerance Patient limited by fatigue   Patient Left in chair;with call bell/phone within reach;with chair alarm set   Nurse Communication Need for lift equipment(for back to bed)        Time: 6004-5997 OT Time Calculation (min): 30 min  Charges: OT General Charges $OT Visit: 1 Visit OT Treatments $Therapeutic Activity: 8-22 mins  09/01/2017 Nestor Lewandowsky, OTR/L Pager: [phone]   Werner Lean, Haze Boyden 09/01/2017, 2:35 PM

## 2017-09-01 NOTE — Progress Notes (Signed)
[place] for coumadin Indication: LVAD  Allergies  Allergen Reactions  . Penicillins Hives, Itching and Other (See Comments)    Has patient had a PCN reaction causing immediate rash, facial/tongue/throat swelling, SOB or lightheadedness with hypotension:# # Yes # # Has patient had a PCN reaction causing severe rash involving mucus membranes or skin necrosis: No Has patient had a PCN reaction that required hospitalization: No Has patient had a PCN reaction occurring within the last 10 years: No If all of the above answers are "NO", then may proceed with Cephalosporin use.     Patient Measurements: Height: 5\' 11"  (180.3 cm) Weight: 222 lb 0.1 oz (100.7 kg) IBW/kg (Calculated) : 75.3 Heparin Dosing Weight: 98.3 kg  Vital Signs: Temp: 96.2 F (35.7 C) (07/03 0800) Temp Source: Axillary (07/03 0800) BP: 83/54 (07/03 0800) Pulse Rate: 78 (07/03 0600)  Labs: Recent Labs    08/30/17 0340 08/31/17 0358 09/01/17 0400  HGB  --  9.2*  --   HCT  --  30.2*  --   PLT  --  547*  --   LABPROT 29.2* 28.2* 27.7*  INR 2.79 2.67 2.61  CREATININE 1.39* 1.30* 1.31*    Estimated Creatinine Clearance: 66.2 mL/min (A) (by C-G formula based on SCr of 1.31 mg/dL (H)).   Medical History: Past Medical History:  Diagnosis Date  . AICD (automatic cardioverter/defibrillator) present 02/05/2014   Upgrade to Medtronic biventricular ICD, serial number  [id number]   . Atrial flutter ([place]) 04/2012   s/p TEE-EPS+RFCA 04/2012  . CAD (coronary artery disease) 3825,0539 X 2    RCA-T, 70% PL (off CFX), 99% Prox LAD/90% Dist LAD, S/P TAXUS stent x 2  . CHF (congestive heart failure) ([place])   . Chronic anticoagulation   . Chronic systolic heart failure ([place])   . CKD (chronic kidney disease)   . Diabetic retinopathy ([place])   . DM type 2 (diabetes mellitus, type 2) ([facility])    insulin dependent  . HTN (hypertension)   . Hypercholesteremia    ablation  . ICD  (implantable cardiac defibrillator) in place   . Ischemic cardiomyopathy March 2015   20-25% 2D   . Nephrolithiasis   . Ventricular tachycardia ([facility])     Medications:  Scheduled:  . amiodarone  200 mg Oral BID  . aspirin  81 mg Oral Daily  . bisacodyl  10 mg Oral Daily   Or  . bisacodyl  10 mg Rectal Daily  . Chlorhexidine Gluconate Cloth  6 each Topical Daily  . collagenase   Topical Daily  . insulin aspart  0-24 Units Subcutaneous Q4H  . insulin detemir  30 Units Subcutaneous QHS  . levalbuterol  0.63 mg Nebulization TID  . mouth rinse  15 mL Mouth Rinse BID  . metoCLOPramide (REGLAN) injection  10 mg Intravenous Q6H  . potassium chloride  40 mEq Oral BID  . sodium chloride flush  10-40 mL Intracatheter Q12H  . spironolactone  25 mg Oral BID  . Warfarin - Pharmacist Dosing Inpatient   Does not apply q1800    Assessment: 27 yom who underwent LVAD placement on 6/17  S/p Heparin drip stopped with INR > 2. INR remains slightly above goal at 2.61. Hgb 9.4, plt 564, LDH stable at 245. No s/sx of bleeding. On concurrent amiodarone, which can impact INR sensitivity.  LFTs/Tbili elevated but continues to trend down.   Goal of Therapy:  INR goal 2-2.5 Monitor platelets by anticoagulation protocol: Yes  Plan:   Warfarin 1 mg x1  Monitor s/s bleeding Will provide education prior to discharge  Doylene Canard, PharmD Clinical Pharmacist  Pager: [phone] Phone: [phone] 09/01/2017 8:30 AM

## 2017-09-01 NOTE — Progress Notes (Signed)
LVAD Coordinator Rounding Not  Admitted 08/11/17 for initiation of Milrinone. 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 He was evaluated [facility] /Dr Posey Pronto on 5/9 for possible heart transplant.   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 Prescott Gum under Destination Therapy criteria. Dr [name] discussed heart transplant candidacy with Dr Posey Pronto. Given size and blood type there was concern he would not make it to transplant.   Pt sitting in chair, eating breakfast, feeding himself. Pt less drowsy today, voice very weak, but is answering questions.   Pt got up to chair without using lift today, stood and bore weight with assistance.   Vital signs: Temp:  98.2 HR: 84 afib Auto BP:  83/54 (61) Doppler: 62 O2 Sat:  90 % on RA Wt: 238>246>242>274>267>259>269>261>260>239>227>225>229>229>227>222 lbs   LVAD interrogation reveals:  Speed: 5200 Flow: 4.4 Power:  3.6w PI:  2.8 Alarms: none  Events: none Hematocrit:  31 Fixed speed: 5200 Low speed limit: 4900  Drive Line: Driveline dressing to be changed by VAD coordinator, nurse champion, or trained caregiver.   Labs:  LDH trend: 274>287>245>240>324>384>425>423>397>267>306>239>245  INR trend:  1.71>1.92>1.57>1.37>2.64>2.97>2.54>2.16>2.63>2.87>2.79>2.67>2.61  Anticoagulation Plan: -INR Goal: 2.0 - 2.5 -ASA Dose: 81 mg daily   Blood Products:  Intra op: - 08/16/17> one platelet; 2 FFP - 08/17/17> ome FFP  Post op: - 08/16/17 > one unit PCs - 08/21/17 > one unit PCs - 08/24/17 > one unit PCs  Device:  - BiV Medtronic - Therapies: off  Respiratory: extubated 08/27/17  Nitric Oxide: off 08/21/17  Gtts: - Epi 1 mcg/min - stopped 08/31/17 - Milrinone 0..25 mcg/kg/min  Adverse Events on VAD: - 08/17/17>mediastinal reexploration with evacuation of mediastinal clot - 08/19/17>sternal closure  VAD education:  1. Caregiver is changing drive line dressing independently.  2. Demonstrated changing power source  from batteries to PM, pt unable to complete today.  Plan/Recommendations:  1. Daily dressing changes per VAD coordinator, nurse champion, or trained caregiver. Wife is to change dressing today. 2. Page VAD Coordinator with any VAD equipment or drive line issues.   Zada Girt RN, VAD Coordinator 24/7 VAD Pager: [phone]

## 2017-09-02 LAB — GLUCOSE, CAPILLARY
GLUCOSE-CAPILLARY: 141 mg/dL — AB (ref 70–99)
GLUCOSE-CAPILLARY: 151 mg/dL — AB (ref 70–99)
GLUCOSE-CAPILLARY: 162 mg/dL — AB (ref 70–99)
GLUCOSE-CAPILLARY: 165 mg/dL — AB (ref 70–99)
Glucose-Capillary: 125 mg/dL — ABNORMAL HIGH (ref 70–99)
Glucose-Capillary: 79 mg/dL (ref 70–99)

## 2017-09-02 LAB — PROTIME-INR
INR: 2.65
Prothrombin Time: 28 seconds — ABNORMAL HIGH (ref 11.4–15.2)

## 2017-09-02 LAB — CBC
HCT: 30.2 % — ABNORMAL LOW (ref 39.0–52.0)
Hemoglobin: 8.9 g/dL — ABNORMAL LOW (ref 13.0–17.0)
MCH: 25.9 pg — ABNORMAL LOW (ref 26.0–34.0)
MCHC: 29.5 g/dL — ABNORMAL LOW (ref 30.0–36.0)
MCV: 87.8 fL (ref 78.0–100.0)
PLATELETS: 526 10*3/uL — AB (ref 150–400)
RBC: 3.44 MIL/uL — ABNORMAL LOW (ref 4.22–5.81)
RDW: 18.5 % — AB (ref 11.5–15.5)
WBC: 11.9 10*3/uL — AB (ref 4.0–10.5)

## 2017-09-02 LAB — COMPREHENSIVE METABOLIC PANEL
ALK PHOS: 81 U/L (ref 38–126)
ALT: 45 U/L — ABNORMAL HIGH (ref 0–44)
ANION GAP: 9 (ref 5–15)
AST: 28 U/L (ref 15–41)
Albumin: 2.9 g/dL — ABNORMAL LOW (ref 3.5–5.0)
BUN: 42 mg/dL — ABNORMAL HIGH (ref 8–23)
CALCIUM: 9.6 mg/dL (ref 8.9–10.3)
CO2: 20 mmol/L — ABNORMAL LOW (ref 22–32)
CREATININE: 1.52 mg/dL — AB (ref 0.61–1.24)
Chloride: 117 mmol/L — ABNORMAL HIGH (ref 98–111)
GFR, EST AFRICAN AMERICAN: 53 mL/min — AB (ref >60)
GFR, EST NON AFRICAN AMERICAN: 46 mL/min — AB (ref >60)
Glucose, Bld: 130 mg/dL — ABNORMAL HIGH (ref 70–99)
Potassium: 4.6 mmol/L (ref 3.5–5.1)
Sodium: 146 mmol/L — ABNORMAL HIGH (ref 135–145)
Total Bilirubin: 1.5 mg/dL — ABNORMAL HIGH (ref 0.3–1.2)
Total Protein: 7.8 g/dL (ref 6.5–8.1)

## 2017-09-02 LAB — LACTATE DEHYDROGENASE: LDH: 236 U/L — ABNORMAL HIGH (ref 98–192)

## 2017-09-02 LAB — COOXEMETRY PANEL
Carboxyhemoglobin: 1.5 % (ref 0.5–1.5)
Methemoglobin: 1.7 % — ABNORMAL HIGH (ref 0.0–1.5)
O2 Saturation: 51.6 %
Total hemoglobin: 9.3 g/dL — ABNORMAL LOW (ref 12.0–16.0)

## 2017-09-02 LAB — MAGNESIUM: Magnesium: 2.4 mg/dL (ref 1.7–2.4)

## 2017-09-02 MED ORDER — COLLAGENASE 250 UNIT/GM EX OINT
TOPICAL_OINTMENT | Freq: Every day | CUTANEOUS | Status: DC
  Administered 2017-09-03 – 2017-09-05 (×3): via TOPICAL
  Administered 2017-09-06: 1 via TOPICAL
  Administered 2017-09-07: 11:00:00 via TOPICAL
  Administered 2017-09-08: 1 via TOPICAL
  Administered 2017-09-09: 09:00:00 via TOPICAL
  Filled 2017-09-02 (×2): qty 30

## 2017-09-02 MED ORDER — POTASSIUM CHLORIDE 20 MEQ PO PACK
40.0000 meq | PACK | Freq: Every day | ORAL | Status: DC
  Administered 2017-09-03 – 2017-09-05 (×3): 40 meq via ORAL
  Filled 2017-09-02 (×4): qty 2

## 2017-09-02 MED ORDER — FAMOTIDINE 20 MG PO TABS
20.0000 mg | ORAL_TABLET | Freq: Two times a day (BID) | ORAL | Status: DC
  Administered 2017-09-02 – 2017-09-10 (×16): 20 mg via ORAL
  Filled 2017-09-02 (×16): qty 1

## 2017-09-02 MED ORDER — WARFARIN SODIUM 1 MG PO TABS
1.0000 mg | ORAL_TABLET | Freq: Once | ORAL | Status: AC
  Administered 2017-09-02: 1 mg via ORAL
  Filled 2017-09-02: qty 1

## 2017-09-02 NOTE — Progress Notes (Signed)
LVAD Coordinator Rounding Not  Admitted 08/11/17 for initiation of Milrinone. 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 He was evaluated [facility] /Dr Posey Pronto on 5/9 for possible heart transplant.   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 Prescott Gum under Destination Therapy criteria. Dr [name] discussed heart transplant candidacy with Dr Posey Pronto. Given size and blood type there was concern he would not make it to transplant.   Pt getting back to bed. Stood with assistance; daughter reports he stepped up on bedside scales this am.   Vital signs: Temp:  97.9 HR: 88 afib Auto BP: not done Doppler:  78 O2 Sat:  88 % on RA Wt: 238.....260>239>227>225>229>229>227>222>228 lbs   LVAD interrogation reveals:  Speed: 5200 Flow: 4.3 Power: 3.6w PI:  2.6 Alarms: none  Events: none Hematocrit:  31 Fixed speed: 5200 Low speed limit: 4900  Drive Line:  Left abd dressing dry and intact; anchors intact and accurately applied. Existing VAD dressing removed and site care performed using sterile technique. Drive line exit site cleaned with Chlora prep applicators x 2, allowed to dry, and gauze dressing with silver strip re-applied. Exit site unincorporated, the velour is fully implanted at exit site, sutures intact. Small amount brownish drainage with no redness, tenderness, foul odor or rash noted. Drive line anchor re-applied.   Labs:  LDH trend: 274......324>384>425>423>397>267>306>239>245>236  INR trend:  1.71.......2.54>2.16>2.63>2.87>2.79>2.67>2.61>2.65  Anticoagulation Plan: -INR Goal: 2.0 - 2.5 -ASA Dose: 81 mg daily   Blood Products:  Intra op: - 08/16/17> one platelet; 2 FFP - 08/17/17> ome FFP  Post op: - 08/16/17 > one unit PCs - 08/21/17 > one unit PCs - 08/24/17 > one unit PCs  Device:  - BiV Medtronic - Therapies: off  Respiratory: extubated 08/27/17  Nitric Oxide: off 08/21/17  Gtts: - Epi 1 mcg/min - stopped 08/31/17 - Milrinone 0..25  mcg/kg/min  Adverse Events on VAD: - 08/17/17>mediastinal reexploration with evacuation of mediastinal clot - 08/19/17>sternal closure  VAD education:  1. Caregiver is changing drive line dressing independently.  2. Delivered HM III Patient Handbooks for wife and daughters review. Asked them to complete reading of handbook.   Plan/Recommendations:  1. Daily dressing changes per VAD coordinator, nurse champion, or trained caregiver. Wife is to change dressing today. 2. Page VAD Coordinator with any VAD equipment or drive line issues.   Zada Girt RN, VAD Coordinator 24/7 VAD Pager: [phone]

## 2017-09-02 NOTE — Progress Notes (Signed)
Patient ID: [name] [name], male   DOB: 02-28-1951, 67 y.o.   MRN: [id number]     Advanced Heart Failure Rounding Note  PCP-Cardiologist: No primary care provider on file.   Subjective:    Events 6/17 Underwent HM-3 implant -> Unable to close chest with RV failure and high intrathoracic pressure 6/18 Taken back to OR for evacuation of hematoma. NO RVAD needed. Left sternum open  08/19/17 Taken back to OR for sternal closure 6/22 VT => amiodarone started.  6/24 VT/VF=>urgent cardioversion. 6/25 VT  =>urgent cardioversion  6/27 Extubated  Coox 51.6% on milrinone 0.25. Off Epi and NE. All tubes out.   CVP 12  More awake but seems confused at times. Hoarse voice. Thirsty. Denies SOB, orthopnea or PND. Creatinine 1.3-> 1.5  LVAD Interrogation HM 3: Speed: 5200 Flow: 4.2 PI: 3.0 Power: 4.0.   Objective:   Weight Range: 103.6 kg (228 lb 6.4 oz) Body mass index is 31.86 kg/m.   Vital Signs:   Temp:  [94.1 F (34.5 C)-98.1 F (36.7 C)] 98.1 F (36.7 C) (07/04 1128) Pulse Rate:  [31-123] 73 (07/04 1500) Resp:  [17-31] 19 (07/04 1500) BP: (68-135)/(49-97) 109/97 (07/03 2100) SpO2:  [90 %-100 %] 96 % (07/04 1500) Weight:  [103.6 kg (228 lb 6.4 oz)] 103.6 kg (228 lb 6.4 oz) (07/04 0500) Last BM Date: 08/31/17  Weight change: Filed Weights   08/31/17 0500 09/01/17 0415 09/02/17 0500  Weight: 103.4 kg (227 lb 15.3 oz) 100.7 kg (222 lb 0.1 oz) 103.6 kg (228 lb 6.4 oz)   Intake/Output:   Intake/Output Summary (Last 24 hours) at 09/02/2017 1514 Last data filed at 09/02/2017 1500 Gross per 24 hour  Intake 966.1 ml  Output 150 ml  Net 816.1 ml   Physical Exam   MAPs 70-80s this am.  General:  Sitting in chair. Weak appearing Hoarse  HEENT: normal  Neck: supple. JVP jaw  Carotids 2+ bilat; no bruits. No lymphadenopathy or thryomegaly appreciated. Cor: LVAD hum.  Lungs: Clear. Abdomen: obese soft, nontender, non-distended. No hepatosplenomegaly. No bruits or masses. Good  bowel sounds. Driveline site clean. Anchor in place.  Extremities: no cyanosis, clubbing, rash. Warm no edema  RUE opicc Neuro: alert & oriented x 3. No focal deficits. Moves all 4 without problem    Telemetry   Afib 80s with PVCs, personally reviewed.   Labs    CBC Recent Labs    09/01/17 0829 09/02/17 1259  WBC 12.6* 11.9*  HGB 9.4* 8.9*  HCT 30.8* 30.2*  MCV 84.8 87.8  PLT 564* 742*   Basic Metabolic Panel Recent Labs    09/01/17 0400 09/02/17 0435  NA 144 146*  K 4.4 4.6  CL 115* 117*  CO2 22 20*  GLUCOSE 91 130*  BUN 38* 42*  CREATININE 1.31* 1.52*  CALCIUM 9.2 9.6  MG 2.3 2.4   Liver Function Tests Recent Labs    09/01/17 0400 09/02/17 0435  AST 37 28  ALT 59* 45*  ALKPHOS 79 81  BILITOT 1.4* 1.5*  PROT 7.6 7.8  ALBUMIN 2.4* 2.9*   No results for input(s): LIPASE, AMYLASE in the last 72 hours. Cardiac Enzymes No results for input(s): CKTOTAL, CKMB, CKMBINDEX, TROPONINI in the last 72 hours.  BNP: BNP (last 3 results) Recent Labs    08/17/17 0314 08/23/17 0024 08/30/17 0000  BNP 266.4* 450.5* 114.6*    ProBNP (last 3 results) Recent Labs    11/12/16 1559  PROBNP 388.0*     D-Dimer  No results for input(s): DDIMER in the last 72 hours. Hemoglobin A1C No results for input(s): HGBA1C in the last 72 hours. Fasting Lipid Panel No results for input(s): CHOL, HDL, LDLCALC, TRIG, CHOLHDL, LDLDIRECT in the last 72 hours. Thyroid Function Tests No results for input(s): TSH, T4TOTAL, T3FREE, THYROIDAB in the last 72 hours.  Invalid input(s): FREET3  Other results:   Imaging    No results found.   Medications:     Scheduled Medications: . amiodarone  200 mg Oral BID  . aspirin  81 mg Oral Daily  . bisacodyl  10 mg Oral Daily   Or  . bisacodyl  10 mg Rectal Daily  . Chlorhexidine Gluconate Cloth  6 each Topical QHS  . collagenase   Topical QHS  . famotidine  20 mg Oral BID  . insulin aspart  0-24 Units Subcutaneous Q4H  .  insulin detemir  30 Units Subcutaneous QHS  . levalbuterol  0.63 mg Nebulization TID  . mouth rinse  15 mL Mouth Rinse BID  . metoCLOPramide (REGLAN) injection  10 mg Intravenous Q6H  . [START ON 09/03/2017] potassium chloride  40 mEq Oral Daily  . sodium chloride flush  10-40 mL Intracatheter Q12H  . spironolactone  25 mg Oral Daily  . warfarin  1 mg Oral ONCE-1800  . Warfarin - Pharmacist Dosing Inpatient   Does not apply q1800    Infusions: . sodium chloride Stopped (09/02/17 0139)  . lactated ringers Stopped (08/19/17 1602)  . milrinone 0.25 mcg/kg/min (09/02/17 1500)    PRN Medications: acetaminophen (TYLENOL) oral liquid 160 mg/5 mL, clonazepam, hydrALAZINE, lactated ringers, ondansetron (ZOFRAN) IV, oxyCODONE, RESOURCE THICKENUP CLEAR, sodium chloride flush    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   Directly admitted with persistent low cardiac output for milrinone initiation for home.  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 Acute/Chronic systolic CHF with biventricular failure-> cardiogenic shock: Echo  08/13/2017 EF 20-25%. s/p  Medtronic BiV ICD in place. Cath 12/18 with stable 1v CAD.  s/p HM-3 implant 6/17. Unable to close chest due to high-intrathoracic pressures and RV failure. Taken back to OR 08/17/17 with evacuation of hematoma with improvement.   - Extubated 6/28  - Coox marginal at 51.6% on milrinone 0.25 mcg/kg/min. Off Epi and NE. Will continue milrinone  - CVP ~12. Off diuretics. Got 500cc IVF 7/3. Creatinine bumped and he is hypernatremic. Continue to hold diuretics. 2. VAD - s/p HM-3 implant 6/17  -VAD interrogated personally. Parameters stable. - INR 2.65. Discussed dosing with PharmD personally.  - Continue asa 81 mg daily.  - LDH 236 3. Acute respiratory failure  post-op - Much improved. Now on [place] 4. Acute on CKD II-III:  -Creatinine 1.31 today.  5. [name]/VF: Recurrent VT on 6/22 - S/P VF 6/24 emergent bedside cardioversion.  - S/P VT 6/26 emergent bedside cardioversion.  - VT Quiescent.  Continue amiodarone gtt.  - Keep K > 4.0 Mg > 2.0.  K 4.6 Mg 2.4 6. AFL - S/p previous ablation. Rate controlled. Well-tolerated.  7. [name]: Recent cath with stable CAD as above - No change to current plan.   8. [name]: Recent A1c 8.3 on 6/3.  - Have adjusted SSI with  low sugars. 9. Anemia:  - Post-op.  Got 1 unit PRBCs 6/22 and 6/25. - Hgb 8.9 08/31/17. 10. Fevers  - Afebrile. Cx negative.  - Remains on meropenem. Can likely stop soon.  11. Deconditioning - Severe. Need to mobilize with PT. CIR following. I discussed with admission coordinator today  12. Dysphagia - Progressing. Appreciate speech.  13. Lethargy/delirium - Much improved with med adjustments.  14. Hypernatremia - In setting of dysphagia 2 diet with thickened liquids so access to free water is reduced. Will follow closely.  Length of Stay: [place], MD  09/02/2017, 3:14 PM  Advanced Heart Failure Team Pager [phone] (M-F; 7a - 4p)  Please contact [facility] for night-coverage after hours (4p -7a ) and weekends on [email or web address]    Will continue to mobilize. Plan Ramp Echo on Friday.   Glori Bickers, MD  3:14 PM

## 2017-09-02 NOTE — Progress Notes (Signed)
[facility] for coumadin Indication: LVAD  Allergies  Allergen Reactions  . Penicillins Hives, Itching and Other (See Comments)    Has patient had a PCN reaction causing immediate rash, facial/tongue/throat swelling, SOB or lightheadedness with hypotension:# # Yes # # Has patient had a PCN reaction causing severe rash involving mucus membranes or skin necrosis: No Has patient had a PCN reaction that required hospitalization: No Has patient had a PCN reaction occurring within the last 10 years: No If all of the above answers are "NO", then may proceed with Cephalosporin use.     Patient Measurements: Height: 5\' 11"  (180.3 cm) Weight: 228 lb 6.4 oz (103.6 kg) IBW/kg (Calculated) : 75.3 Heparin Dosing Weight: 98.3 kg  Vital Signs: Temp: 97.9 F (36.6 C) (07/04 0751) Temp Source: Oral (07/04 0751) Pulse Rate: 78 (07/04 1000)  Labs: Recent Labs    08/31/17 0358 09/01/17 0400 09/01/17 0829 09/02/17 0435  HGB 9.2*  --  9.4*  --   HCT 30.2*  --  30.8*  --   PLT 547*  --  564*  --   LABPROT 28.2* 27.7*  --  28.0*  INR 2.67 2.61  --  2.65  CREATININE 1.30* 1.31*  --  1.52*    Estimated Creatinine Clearance: 57.8 mL/min (A) (by C-G formula based on SCr of 1.52 mg/dL (H)).   Medical History: Past Medical History:  Diagnosis Date  . AICD (automatic cardioverter/defibrillator) present 02/05/2014   Upgrade to Medtronic biventricular ICD, serial number  BLD [id number]   . Atrial flutter ([place]) 04/2012   s/p TEE-EPS+RFCA 04/2012  . CAD (coronary artery disease) 4580,9983 X 2    RCA-T, 70% PL (off CFX), 99% Prox LAD/90% Dist LAD, S/P TAXUS stent x 2  . CHF (congestive heart failure) ([place])   . Chronic anticoagulation   . Chronic systolic heart failure ([place])   . CKD (chronic kidney disease)   . Diabetic retinopathy ([place])   . DM type 2 (diabetes mellitus, type 2) (HCC)    insulin dependent  . HTN (hypertension)   . Hypercholesteremia    ablation  . ICD (implantable cardiac defibrillator) in place   . Ischemic cardiomyopathy March 2015   20-25% 2D   . Nephrolithiasis   . Ventricular tachycardia (HCC)     Medications:  Scheduled:  . amiodarone  200 mg Oral BID  . aspirin  81 mg Oral Daily  . bisacodyl  10 mg Oral Daily   Or  . bisacodyl  10 mg Rectal Daily  . Chlorhexidine Gluconate Cloth  6 each Topical QHS  . collagenase   Topical QHS  . famotidine  20 mg Oral BID  . insulin aspart  0-24 Units Subcutaneous Q4H  . insulin detemir  30 Units Subcutaneous QHS  . levalbuterol  0.63 mg Nebulization TID  . mouth rinse  15 mL Mouth Rinse BID  . metoCLOPramide (REGLAN) injection  10 mg Intravenous Q6H  . [START ON 09/03/2017] potassium chloride  40 mEq Oral Daily  . sodium chloride flush  10-40 mL Intracatheter Q12H  . spironolactone  25 mg Oral Daily  . warfarin  1 mg Oral ONCE-1800  . Warfarin - Pharmacist Dosing Inpatient   Does not apply q1800    Assessment: 25 yom who underwent LVAD placement on 6/17  S/p Heparin drip stopped with INR > 2. INR remains slightly above goal at 2.61. Hgb stable  plt 564, LDH stable at 200s. No s/sx of bleeding. On concurrent  amiodarone, which can impact INR sensitivity.  LFTs/Tbili elevated but continues to trend down.   Goal of Therapy:  INR goal 2-2.5 Monitor platelets by anticoagulation protocol: Yes   Plan:   Warfarin 1 mg x1  Monitor s/s bleeding Will provide education prior to discharge  Bonnita Nasuti Pharm.D. CPP, BCPS Clinical Pharmacist [phone] 09/02/2017 11:20 AM

## 2017-09-02 NOTE — Progress Notes (Signed)
Patient ID: [name] [name], male   DOB: 1950-08-30, 67 y.o.   MRN: [id number]  TCTS Evening Rounds  Hemodynamically stable today on milrinone 0.25  MAP 80 CVP 13 Up in chair a lot today and ate a decent amount for dinner according to nurse. Had BM.  VAD parameters stable.

## 2017-09-03 ENCOUNTER — Inpatient Hospital Stay (HOSPITAL_COMMUNITY): Payer: PPO

## 2017-09-03 LAB — CBC
HEMATOCRIT: 28.5 % — AB (ref 39.0–52.0)
HEMOGLOBIN: 8.6 g/dL — AB (ref 13.0–17.0)
MCH: 25.9 pg — ABNORMAL LOW (ref 26.0–34.0)
MCHC: 30.2 g/dL (ref 30.0–36.0)
MCV: 85.8 fL (ref 78.0–100.0)
Platelets: 491 10*3/uL — ABNORMAL HIGH (ref 150–400)
RBC: 3.32 MIL/uL — AB (ref 4.22–5.81)
RDW: 18.6 % — AB (ref 11.5–15.5)
WBC: 9.4 10*3/uL (ref 4.0–10.5)

## 2017-09-03 LAB — GLUCOSE, CAPILLARY
GLUCOSE-CAPILLARY: 83 mg/dL (ref 70–99)
GLUCOSE-CAPILLARY: 84 mg/dL (ref 70–99)
GLUCOSE-CAPILLARY: 99 mg/dL (ref 70–99)
Glucose-Capillary: 127 mg/dL — ABNORMAL HIGH (ref 70–99)
Glucose-Capillary: 132 mg/dL — ABNORMAL HIGH (ref 70–99)
Glucose-Capillary: 176 mg/dL — ABNORMAL HIGH (ref 70–99)
Glucose-Capillary: 203 mg/dL — ABNORMAL HIGH (ref 70–99)

## 2017-09-03 LAB — COMPREHENSIVE METABOLIC PANEL
ALBUMIN: 2.7 g/dL — AB (ref 3.5–5.0)
ALT: 37 U/L (ref 0–44)
AST: 27 U/L (ref 15–41)
Alkaline Phosphatase: 70 U/L (ref 38–126)
Anion gap: 7 (ref 5–15)
BUN: 40 mg/dL — AB (ref 8–23)
CO2: 20 mmol/L — ABNORMAL LOW (ref 22–32)
Calcium: 9.1 mg/dL (ref 8.9–10.3)
Chloride: 117 mmol/L — ABNORMAL HIGH (ref 98–111)
Creatinine, Ser: 1.47 mg/dL — ABNORMAL HIGH (ref 0.61–1.24)
GFR calc Af Amer: 55 mL/min — ABNORMAL LOW (ref >60)
GFR calc non Af Amer: 48 mL/min — ABNORMAL LOW (ref >60)
GLUCOSE: 82 mg/dL (ref 70–99)
POTASSIUM: 3.9 mmol/L (ref 3.5–5.1)
Sodium: 144 mmol/L (ref 135–145)
Total Bilirubin: 1.5 mg/dL — ABNORMAL HIGH (ref 0.3–1.2)
Total Protein: 7.4 g/dL (ref 6.5–8.1)

## 2017-09-03 LAB — COOXEMETRY PANEL
Carboxyhemoglobin: 2.2 % — ABNORMAL HIGH (ref 0.5–1.5)
Methemoglobin: 1.3 % (ref 0.0–1.5)
O2 Saturation: 56.8 %
Total hemoglobin: 9.7 g/dL — ABNORMAL LOW (ref 12.0–16.0)

## 2017-09-03 LAB — PROTIME-INR
INR: 2.58
Prothrombin Time: 27.5 seconds — ABNORMAL HIGH (ref 11.4–15.2)

## 2017-09-03 LAB — LACTATE DEHYDROGENASE: LDH: 223 U/L — ABNORMAL HIGH (ref 98–192)

## 2017-09-03 LAB — MAGNESIUM: Magnesium: 2.2 mg/dL (ref 1.7–2.4)

## 2017-09-03 MED ORDER — WARFARIN 0.5 MG HALF TABLET
0.5000 mg | ORAL_TABLET | Freq: Once | ORAL | Status: AC
  Administered 2017-09-03: 0.5 mg via ORAL
  Filled 2017-09-03: qty 1

## 2017-09-03 MED ORDER — TORSEMIDE 20 MG PO TABS
20.0000 mg | ORAL_TABLET | Freq: Once | ORAL | Status: AC
  Administered 2017-09-03: 20 mg via ORAL
  Filled 2017-09-03: qty 1

## 2017-09-03 MED ORDER — LEVALBUTEROL HCL 0.63 MG/3ML IN NEBU: 0.6300 mg | INHALATION_SOLUTION | Freq: Four times a day (QID) | RESPIRATORY_TRACT | Status: DC | PRN

## 2017-09-03 NOTE — Progress Notes (Signed)
Patient ID: [name] [name], male   DOB: 1950/06/01, 67 y.o.   MRN: [id number] TCTS Evening Rounds:  Hemodynamically stable VAD parameters stable Had MBS today but no change in dysphagia. Continue Dys 2 diet

## 2017-09-03 NOTE — Progress Notes (Signed)
LVAD Coordinator Rounding Note  Admitted 08/11/17 for initiation of Milrinone. 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 He was evaluated [name] /Dr Posey Pronto on 5/9 for possible heart transplant.   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 Prescott Gum under Destination Therapy criteria. Dr [name] discussed heart transplant candidacy with Dr Posey Pronto. Given size and blood type there was concern he would not make it to transplant.   Pt up in the chair this morning. Asking to go back to bed, pt states that his backside is hurting.   Vital signs: Temp:  97.8 HR: 79 afib Auto BP: not done Doppler:  74 O2 Sat:  97 % on 2 L/[place] Wt: 238.....260>239>227>225>229>229>227>222>228>225 lbs   LVAD interrogation reveals:  Speed: 5200 Flow: 4.1 Power: 3.5w PI:  2.6 Alarms: none  Events: none Hematocrit:  29 Fixed speed: 5200 Low speed limit: 4900  Drive Line:  Left abd dressing dry and intact; anchors intact and accurately applied. Wife will change the dressing today.  Labs:  LDH trend: 274......324>384>425>423>397>267>306>239>245>236>223  INR trend:  1.71.......2.54>2.16>2.63>2.87>2.79>2.67>2.61>2.65>2.58  Anticoagulation Plan: -INR Goal: 2.0 - 2.5 -ASA Dose: 81 mg daily   Blood Products:  Intra op: - 08/16/17> one platelet; 2 FFP - 08/17/17> ome FFP  Post op: - 08/16/17 > one unit PCs - 08/21/17 > one unit PCs - 08/24/17 > one unit PCs  Device:  - BiV Medtronic - Therapies: off  Respiratory: extubated 08/27/17  Nitric Oxide: off 08/21/17  Gtts: - Epi 1 mcg/min - stopped 08/31/17 - Milrinone 0.25 mcg/kg/min  Adverse Events on VAD: - 08/17/17>mediastinal reexploration with evacuation of mediastinal clot - 08/19/17>sternal closure  VAD education:  1. Caregiver is changing drive line dressing independently.  2. Delivered HM III Patient Handbooks for wife and daughters review. Asked them to complete reading of handbook.   Plan/Recommendations:  1. Daily  dressing changes per VAD coordinator, nurse champion, or trained caregiver. Wife is to change dressing today. 2. Page VAD Coordinator with any VAD equipment or drive line issues.   Tanda Rockers RN, VAD Coordinator 24/7 VAD Pager: [phone]

## 2017-09-03 NOTE — Progress Notes (Signed)
  Speech Language Pathology Treatment: Dysphagia  Patient Details Name: [name] [name] MRN: [id number] DOB: 10/22/1950 Today's Date: 09/03/2017 Time: 7209-4709 SLP Time Calculation (min) (ACUTE ONLY): 20 min  Assessment / Plan / Recommendation Clinical Impression  Returned to room to provide thorough education with wife following MBS (with pt's permission). SLP facilitated session with video playback of MBS which reiterated importance of chin tuck posture with liquids. Provided verbal clinical reasoning for continued recommendations and answered questions. Wife reported that "I think he was having swallowing problems before because he coughed when eating/drinking." ST will continue intervention for swallow safety.     HPI HPI: 67 y.o. 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       SLP Plan  Continue with current plan of care       Recommendations  Diet recommendations: Dysphagia 2 (fine chop);Honey-thick liquid Liquids provided via: Cup Medication Administration: Crushed with puree Supervision: Patient able to self feed;Full supervision/cueing for compensatory strategies Compensations: Slow rate;Small sips/bites;Clear throat intermittently;Chin tuck(chin tuck with liquids) Postural Changes and/or Swallow Maneuvers: Seated upright 90 degrees                Oral Care Recommendations: Oral care BID Follow up Recommendations: Skilled Nursing facility SLP Visit Diagnosis: Dysphagia, oropharyngeal phase (R13.12) Plan: Continue with current plan of care       GO                [name] [name] 09/03/2017, 5:20 PM  [name] [name].Ed [employer] [phone]

## 2017-09-03 NOTE — Plan of Care (Signed)
  Problem: Activity: Goal: Risk for activity intolerance will decrease Outcome: Progressing   Problem: Nutrition: Goal: Adequate nutrition will be maintained Outcome: Progressing   Problem: Pain Managment: Goal: General experience of comfort will improve Outcome: Progressing   Problem: Safety: Goal: Ability to remain free from injury will improve Outcome: Progressing   Problem: Coping: Goal: Level of anxiety will decrease Outcome: Progressing   Problem: Respiratory: Goal: Will regain and/or maintain adequate ventilation Outcome: Progressing

## 2017-09-03 NOTE — Consult Note (Signed)
[facility] Nurse wound follow up Wound type:Deep tissue injury to bilateral buttock- ongoing follow up Wound type: Pressure injuries Pressure Injury POA: No Measurements:\Left gluteal fold:  2.5 cm x 3 cm with devitalized tissue in the center Right gluteal fold  8.5 cm x 6 cm x unknown depth. This is an unstageable wound due to the slough. wound bed is approximately 50% devitalized tissue 50% pink, pale nongranulating tissue.  This has improved with enzymatic debridement Dressing procedure/placement/frequency: Continue ongoing orders Will assess weekly.  Domenic Moras RN BSN [place] Pager [phone]

## 2017-09-03 NOTE — Progress Notes (Addendum)
Nutrition Follow-up  DOCUMENTATION CODES:   Obesity unspecified  INTERVENTION:   Continue:  Magic cup TID with meals, each supplement provides 290 kcal and 9 grams of protein  Snacks between meals   NUTRITION DIAGNOSIS:   Inadequate oral intake related to inability to eat as evidenced by NPO status. Ongoing.   GOAL:   Patient will meet greater than or equal to 90% of their needs Progressing  MONITOR:   PO intake, Supplement acceptance, Labs, Weight trends  ASSESSMENT:   67 yo male admitted with acute on chronic CHF, pt being worked up for destination LVAD. Pt with hx of CHF due to ICM, CAD s/p PCI, PAD, DM, HTN, HL, CKD II/III  Spoke with pt, wife and Therapist, sports. Per their report pt eating less than 50% of his meal trays but does like the magic cup and is consuming them.  MBS planned for this afternoon, await results.  Per [facility] [place] RN, wound looks better with treatment   6/12 Admitted 6/17 LVAD, Intubated 6/18 Mediastinal re-exploration with evacuation of hematoma 6/20 Evacuation of R-sided pericardia hematoma 6/21 TF initiated 6/27 Extubated 6/29 MBS yesterday, Dysphagia I, Honey thick diet 7/2 diet increased to Dysphagia 2, Honey thick diet  Labs: reviewed Meds: dulcolax daily, levemir 30 units daily, reglan, 40 mEq KCl, milrinone .25  mcg   Diet Order:   Diet Order           DIET DYS 2 Room service appropriate? Yes; Fluid consistency: Honey Thick  Diet effective now          EDUCATION NEEDS:   Education needs have been addressed  Skin:  Skin Assessment: Skin Integrity Issues: Skin Integrity Issues:: DTI, Other (Comment) DTI: unstageable to bilateral buttocks Other: non-pressure wound on buttock, quarter-size wound on back  Last BM:  7/5 smear, suppository ordered  Height:   Ht Readings from Last 1 Encounters:  09/03/17 5\' 11"  (1.803 m)    Weight:   Wt Readings from Last 1 Encounters:  09/03/17 225 lb 4.8 oz (102.2 kg)    Ideal Body Weight:  78.1  kg  BMI:  Body mass index is 31.42 kg/m.  Estimated Nutritional Needs:   Kcal:  2000-2280  Protein:  100-120 g  Fluid:  >/= 1.8 L  Maylon Peppers RD, LDN, CNSC [phone] Pager [phone] After Hours Pager

## 2017-09-03 NOTE — Progress Notes (Signed)
Inpatient Rehabilitation-Admissions Coordinator   Pt up in recliner this AM with family in room. AC is still following pt for potential admit to CIR. Pt will need to be off Milrinone (or be on home dosage) prior to admit to CIR and continue to demonstrate increased activity tolerance with therapies. AC will follow up with pt on Monday. Please call if questions.   Jhonnie Garner, OTR/L  Rehab Admissions Coordinator  [phone] 09/03/2017 11:42 AM

## 2017-09-03 NOTE — Progress Notes (Signed)
[name] for coumadin Indication: LVAD  Allergies  Allergen Reactions  . Penicillins Hives, Itching and Other (See Comments)    Has patient had a PCN reaction causing immediate rash, facial/tongue/throat swelling, SOB or lightheadedness with hypotension:# # Yes # # Has patient had a PCN reaction causing severe rash involving mucus membranes or skin necrosis: No Has patient had a PCN reaction that required hospitalization: No Has patient had a PCN reaction occurring within the last 10 years: No If all of the above answers are "NO", then may proceed with Cephalosporin use.     Patient Measurements: Height: 5\' 11"  (180.3 cm) Weight: 225 lb 4.8 oz (102.2 kg) IBW/kg (Calculated) : 75.3 Heparin Dosing Weight: 98.3 kg  Vital Signs: Temp: 97.8 F (36.6 C) (07/05 1125) Temp Source: Oral (07/05 1125) Pulse Rate: 67 (07/05 1100)  Labs: Recent Labs    09/01/17 0400  09/01/17 0829 09/02/17 0435 09/02/17 1259 09/03/17 0443  HGB  --    < > 9.4*  --  8.9* 8.6*  HCT  --   --  30.8*  --  30.2* 28.5*  PLT  --   --  564*  --  526* 491*  LABPROT 27.7*  --   --  28.0*  --  27.5*  INR 2.61  --   --  2.65  --  2.58  CREATININE 1.31*  --   --  1.52*  --  1.47*   < > = values in this interval not displayed.    Estimated Creatinine Clearance: 59.4 mL/min (A) (by C-G formula based on SCr of 1.47 mg/dL (H)).   Medical History: Past Medical History:  Diagnosis Date  . AICD (automatic cardioverter/defibrillator) present 02/05/2014   Upgrade to Medtronic biventricular ICD, serial number  BLD [id number]   . Atrial flutter ([place]) 04/2012   s/p TEE-EPS+RFCA 04/2012  . CAD (coronary artery disease) 9983,3825 X 2    RCA-T, 70% PL (off CFX), 99% Prox LAD/90% Dist LAD, S/P TAXUS stent x 2  . CHF (congestive heart failure) ([place])   . Chronic anticoagulation   . Chronic systolic heart failure ([place])   . CKD (chronic kidney disease)   . Diabetic retinopathy ([place])    . DM type 2 (diabetes mellitus, type 2) (HCC)    insulin dependent  . HTN (hypertension)   . Hypercholesteremia    ablation  . ICD (implantable cardiac defibrillator) in place   . Ischemic cardiomyopathy March 2015   20-25% 2D   . Nephrolithiasis   . Ventricular tachycardia (HCC)     Medications:  Scheduled:  . amiodarone  200 mg Oral BID  . aspirin  81 mg Oral Daily  . bisacodyl  10 mg Oral Daily   Or  . bisacodyl  10 mg Rectal Daily  . Chlorhexidine Gluconate Cloth  6 each Topical QHS  . collagenase   Topical Daily  . famotidine  20 mg Oral BID  . insulin aspart  0-24 Units Subcutaneous Q4H  . insulin detemir  30 Units Subcutaneous QHS  . mouth rinse  15 mL Mouth Rinse BID  . metoCLOPramide (REGLAN) injection  10 mg Intravenous Q6H  . potassium chloride  40 mEq Oral Daily  . sodium chloride flush  10-40 mL Intracatheter Q12H  . spironolactone  25 mg Oral Daily  . warfarin  0.5 mg Oral ONCE-1800  . Warfarin - Pharmacist Dosing Inpatient   Does not apply q1800    Assessment: 56 yom who underwent  LVAD placement on 6/17  S/p Heparin drip stopped with INR > 2. INR remains slightly above goal at 2.58. CBC stable, LDH stable at 200s. No s/sx of bleeding. On concurrent amiodarone, which can impact INR sensitivity.  LFTs/Tbili elevated but continues to trend down.   Goal of Therapy:  INR goal 2-2.5 Monitor platelets by anticoagulation protocol: Yes   Plan:   Warfarin 0.5 mg x 1 tonight. Monitor s/s bleeding Will provide warfarin education prior to discharge  Marguerite Olea, [place] Pharmacist Phone [phone]  09/03/2017 1:03 PM

## 2017-09-03 NOTE — Progress Notes (Addendum)
Patient ID: [name] [name], male   DOB: 05-29-1950, 67 y.o.   MRN: [id number]     Advanced Heart Failure Rounding Note  PCP-Cardiologist: No primary care provider on file.   Subjective:    Events 6/17 Underwent HM-3 implant -> Unable to close chest with RV failure and high intrathoracic pressure 6/18 Taken back to OR for evacuation of hematoma. NO RVAD needed. Left sternum open  08/19/17 Taken back to OR for sternal closure 6/22 VT => amiodarone started.  6/24 VT/VF=>urgent cardioversion. 6/25 VT  =>urgent cardioversion  6/27 Extubated  Coox 56.8% on milrinone 0.25. Off Epi and NE.   CVP ~12. Creatinine 1.52 -> 1.47.  Feeling better each day. Remains hoarse. Denies CP, palpitations, SOB, lightheadedness or dizziness. Remains thirsty.   LVAD Interrogation HM 3: Speed: 5200 Flow: 4.2 PI: 3.1 Power: 4.0. No PI events.    Objective:   Weight Range: 225 lb 4.8 oz (102.2 kg) Body mass index is 31.42 kg/m.   Vital Signs:   Temp:  [97.4 F (36.3 C)-98.1 F (36.7 C)] 97.7 F (36.5 C) (07/05 0445) Pulse Rate:  [28-145] 120 (07/05 0700) Resp:  [14-33] 14 (07/05 0700) SpO2:  [92 %-100 %] 95 % (07/05 0700) Weight:  [225 lb 4.8 oz (102.2 kg)] 225 lb 4.8 oz (102.2 kg) (07/05 0500) Last BM Date: 09/02/17  Weight change: Filed Weights   09/01/17 0415 09/02/17 0500 09/03/17 0500  Weight: 222 lb 0.1 oz (100.7 kg) 228 lb 6.4 oz (103.6 kg) 225 lb 4.8 oz (102.2 kg)   Intake/Output:   Intake/Output Summary (Last 24 hours) at 09/03/2017 0730 Last data filed at 09/03/2017 0700 Gross per 24 hour  Intake 1388.05 ml  Output 151 ml  Net 1237.05 ml   Physical Exam   MAPs 80s this am.   General: Sitting in chair. NAD. Hoarse  HEENT: Normal. Anicteric Neck: Supple, JVP elevated. Carotids OK.  Cardiac:  Mechanical heart sounds with LVAD hum present.  Lungs:  CTAB, normal effort.  Abdomen:  NT, ND, no HSM. No bruits or masses. +BS  LVAD exit site: Dressing dry and intact. No erythema  or drainage. Stabilization device present and accurately applied. Driveline dressing changed daily per sterile technique. Extremities: no cyanosis, clubbing, rash, edema RUE PICC Neuro: alert & oriented x 3, cranial nerves grossly intact. moves all 4 extremities w/o difficulty. Affect pleasant   Telemetry   Afib 80s with PVCs, personally reviewed.   Labs    CBC Recent Labs    09/02/17 1259 09/03/17 0443  WBC 11.9* 9.4  HGB 8.9* 8.6*  HCT 30.2* 28.5*  MCV 87.8 85.8  PLT 526* 601*   Basic Metabolic Panel Recent Labs    09/02/17 0435 09/03/17 0443  NA 146* 144  K 4.6 3.9  CL 117* 117*  CO2 20* 20*  GLUCOSE 130* 82  BUN 42* 40*  CREATININE 1.52* 1.47*  CALCIUM 9.6 9.1  MG 2.4 2.2   Liver Function Tests Recent Labs    09/02/17 0435 09/03/17 0443  AST 28 27  ALT 45* 37  ALKPHOS 81 70  BILITOT 1.5* 1.5*  PROT 7.8 7.4  ALBUMIN 2.9* 2.7*   No results for input(s): LIPASE, AMYLASE in the last 72 hours. Cardiac Enzymes No results for input(s): CKTOTAL, CKMB, CKMBINDEX, TROPONINI in the last 72 hours.  BNP: BNP (last 3 results) Recent Labs    08/17/17 0314 08/23/17 0024 08/30/17 0000  BNP 266.4* 450.5* 114.6*    ProBNP (last 3 results) Recent  Labs    11/12/16 1559  PROBNP 388.0*     D-Dimer No results for input(s): DDIMER in the last 72 hours. Hemoglobin A1C No results for input(s): HGBA1C in the last 72 hours. Fasting Lipid Panel No results for input(s): CHOL, HDL, LDLCALC, TRIG, CHOLHDL, LDLDIRECT in the last 72 hours. Thyroid Function Tests No results for input(s): TSH, T4TOTAL, T3FREE, THYROIDAB in the last 72 hours.  Invalid input(s): FREET3  Other results:   Imaging    No results found.   Medications:     Scheduled Medications: . amiodarone  200 mg Oral BID  . aspirin  81 mg Oral Daily  . bisacodyl  10 mg Oral Daily   Or  . bisacodyl  10 mg Rectal Daily  . Chlorhexidine Gluconate Cloth  6 each Topical QHS  . collagenase    Topical Daily  . famotidine  20 mg Oral BID  . insulin aspart  0-24 Units Subcutaneous Q4H  . insulin detemir  30 Units Subcutaneous QHS  . levalbuterol  0.63 mg Nebulization TID  . mouth rinse  15 mL Mouth Rinse BID  . metoCLOPramide (REGLAN) injection  10 mg Intravenous Q6H  . potassium chloride  40 mEq Oral Daily  . sodium chloride flush  10-40 mL Intracatheter Q12H  . spironolactone  25 mg Oral Daily  . Warfarin - Pharmacist Dosing Inpatient   Does not apply q1800    Infusions: . sodium chloride Stopped (09/03/17 0444)  . lactated ringers Stopped (08/19/17 1602)  . milrinone 0.25 mcg/kg/min (09/03/17 0700)    PRN Medications: acetaminophen (TYLENOL) oral liquid 160 mg/5 mL, clonazepam, hydrALAZINE, lactated ringers, ondansetron (ZOFRAN) IV, oxyCODONE, RESOURCE THICKENUP CLEAR, sodium chloride flush    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   Directly admitted with persistent low cardiac output for milrinone initiation for home.  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 Acute/Chronic systolic CHF with biventricular failure-> cardiogenic shock: Echo  08/13/2017 EF 20-25%. s/p  Medtronic BiV ICD in place. Cath 12/18 with stable 1v CAD.  s/p HM-3 implant 6/17. Unable to close chest due to high-intrathoracic pressures and RV failure. Taken back to OR 08/17/17 with evacuation of hematoma with improvement.   - Extubated 6/28  - Coox 56.8% this am on milrinone 0.25 mcg/kg/min.  - CVP 12. Diuretics on hold with creatinine bump. - Will discuss with MD. Can likely resume po torsemide today. (was on 50 mg daily at home), may need to start lower.  2. VAD - s/p HM-3 implant 6/17  - VAD interrogated personally. Parameters stable.   - INR 2.58. Discussed dosing with PharmD personally.  - Continue asa 81 mg  daily.  - LDH 223 3. Acute respiratory failure post-op - Stable on [place].  4. Acute on CKD II-III:  -Creatinine 1.47 today.   5. [name]/VF: Recurrent VT on 6/22 - S/P VF 6/24 emergent bedside cardioversion.  - S/P VT 6/26 emergent bedside cardioversion.  - VT quiescent on amiodarone gtt.  - Keep K > 4.0 Mg > 2.0.   6. AFL - S/p previous ablation.  - Rate controlled. Well tolerated.  7. [name]: Recent cath with stable CAD as above - No change to current plan.   8.  [name]: Recent A1c 8.3 on 6/3.  - Have adjusted SSI with low sugars. - No change to current plan.   9. Anemia:  - Post-op.  Got 1 unit PRBCs 6/22 and 6/25. - Hgb 8.6. Follow.  10. Fevers  - Afebrile. Cx negative.  - Remains on meropenem. Can likely stop soon.  11. Deconditioning - Severe. Need to mobilize with PT. CIR following for possible admission.  12. Dysphagia - Progressing.  13. Lethargy/delirium - Much improved with med adjustments.  - No change to current plan.   14. Hypernatremia - In setting of dysphagia 2 diet with thickened liquids so access to free water is reduced. Will follow closely. - Na 144 today.   Length of Stay: [place], [place]  09/03/2017, 7:30 AM  Advanced Heart Failure Team Pager [phone] (M-F; 7a - 4p)  Please contact [facility] for night-coverage after hours (4p -7a ) and weekends on [email or web address]  Patient seen and examined with the above-signed Advanced Practice Provider and/or Housestaff. I personally reviewed laboratory data, imaging studies and relevant notes. I independently examined the patient and formulated the important aspects of the plan. I have edited the note to reflect any of my changes or salient points. I have personally discussed the plan with the patient and/or family.  Remains tenuous but improving. Remains on milrinone at 0.25 with co-ox 57% for RV failure. Will continue today. Can wean tomorrow to 0.125 if stable. Volume status looks  good. Will resume torsemide tomorrow. Failed swallow study again for thin liquids - continue dysphagia-2 diet. Watch serum sodium with limited access to free water. INR 2.58. Discussed dosing with PharmD personally. VAD interrogated personally. Parameters stable. [place]ue to mobilize. Needs ramp echo on Monday. Possibly to CIR early next week.   Glori Bickers, MD  6:44 PM

## 2017-09-03 NOTE — Progress Notes (Signed)
  Speech Language Pathology Patient Details Name: [name] [name] MRN: [id number] DOB: 05/26/1950 Today's Date: 09/03/2017 Time:  -    Repeat MBS today at 1300 to reassess current oropharyngeal swallow function.  [name] [name] [place].Ed CCC-SLP Pager [place], Monisha Siebel Willis 09/03/2017, 10:39 AM

## 2017-09-03 NOTE — Progress Notes (Signed)
HeartMate 3  postop day #18 rounding Note  Subjective:   HeartMate 3 implantation with combined tricuspid valve annuloplasty repair June 17 for mixed ischemic and nonischemic cardiomyopathy, cardiac amyloid disease and preoperative moderate to severe RV dysfunction, preoperative moderate to severe tricuspid regurgitation  Patient had significant RV dysfunction and sternum was left open to maintain adequate hemodynamics.  During the night after surgeryThe patient developed  further signs of right ventricular dysfunction and was taken emergently back for mediastinal reexploration.  A clot was found compressing the right atrium from oozing from the pericardium where adhesions had been taken down at the time of surgery.  The patient returned to the operating room on June 20 for delayed sternal closure.  . Chronic atrial fibrillation. INR therapeutic.  Heparin off  Postop VT now improved for > 2weeks on amiodarone  Patient is overall strength slowly improving with physical therapy.  He is now able to walk short distances. Swallow study performed and pt on dysphagia diet Patient more engaged and getting stronger.  VAD parameters saisfactory, RV function improving, co-ox good on milrinone.  Leave VAD speed at 47 RPM because of risk of VT   Surgical incisions clean and dry.  Temporary pacing wires have been removed.  LVAD INTERROGATION:  HeartMate II LVAD:  Flow 4 liters/min, speed 5200, power 3.8, PI 2.8.  Controller intact.   Objective:    Vital Signs:   Temp:  [97.4 F (36.3 C)-98.2 F (36.8 C)] 98.2 F (36.8 C) (07/05 0900) Pulse Rate:  [28-145] 67 (07/05 1100) Resp:  [14-33] 19 (07/05 1100) SpO2:  [90 %-100 %] 90 % (07/05 1100) Weight:  [225 lb 4.8 oz (102.2 kg)] 225 lb 4.8 oz (102.2 kg) (07/05 0500) Last BM Date: 09/03/17 Mean arterial Pressure 75-80 mmHg  Intake/Output:   Intake/Output Summary (Last 24 hours) at 09/03/2017 1132 Last data filed at 09/03/2017 1000 Gross per 24  hour  Intake 1140.47 ml  Output 151 ml  Net 989.47 ml     Physical Exam: General:  Well appearing.  Up in chair HEENT: normal Neck: supple. JVP normal.  no bruits. No lymphadenopathy or thryomegaly appreciated. Cor: Mechanical heart sounds with LVAD hum present. Lungs: clear Abdomen: soft, nontender, nondistended. No hepatosplenomegaly. No bruits or masses. Good bowel sounds. Extremities: no cyanosis, clubbing, rash, edema Neuro: alert & orientedx3, cranial nerves grossly intact. moves all 4 extremities w/o difficulty. Affect pleasant  Telemetry intrinsic stable A. fib heart rate 90's  Labs: Basic Metabolic Panel: Recent Labs  Lab 08/28/17 0359  08/30/17 0340 08/31/17 0358 09/01/17 0400 09/02/17 0435 09/03/17 0443  NA 132*   < > 140 141 144 146* 144  K NOT DONE   < > 4.1 3.6 4.4 4.6 3.9  CL 89*   < > 102 111 115* 117* 117*  CO2 32   < > 28 24 22  20* 20*  GLUCOSE 169*   < > 117* 117* 91 130* 82  BUN 60*   < > 45* 38* 38* 42* 40*  CREATININE 1.48*   < > 1.39* 1.30* 1.31* 1.52* 1.47*  CALCIUM 9.0   < > 9.3 9.0 9.2 9.6 9.1  MG 2.4   < > 2.5* 2.3 2.3 2.4 2.2  PHOS 4.0  --   --   --   --   --   --    < > = values in this interval not displayed.    Liver Function Tests: Recent Labs  Lab 08/30/17 0340 08/31/17 0358 09/01/17  0400 09/02/17 0435 09/03/17 0443  AST 75* 48* 37 28 27  ALT 93* 73* 59* 45* 37  ALKPHOS 78 73 79 81 70  BILITOT 1.5* 1.7* 1.4* 1.5* 1.5*  PROT 7.5 7.4 7.6 7.8 7.4  ALBUMIN 2.4* 2.3* 2.4* 2.9* 2.7*   No results for input(s): LIPASE, AMYLASE in the last 168 hours. No results for input(s): AMMONIA in the last 168 hours.  CBC: Recent Labs  Lab 08/28/17 0359 08/31/17 0358 09/01/17 0829 09/02/17 1259 09/03/17 0443  WBC 15.9* 12.7* 12.6* 11.9* 9.4  HGB 9.4* 9.2* 9.4* 8.9* 8.6*  HCT 30.4* 30.2* 30.8* 30.2* 28.5*  MCV 86.4 84.6 84.8 87.8 85.8  PLT 411* 547* 564* 526* 491*    INR: Recent Labs  Lab 08/30/17 0340 08/31/17 0358  09/01/17 0400 09/02/17 0435 09/03/17 0443  INR 2.79 2.67 2.61 2.65 2.58    Other results:  EKG:   Imaging: No results found.   Medications:     Scheduled Medications: . amiodarone  200 mg Oral BID  . aspirin  81 mg Oral Daily  . bisacodyl  10 mg Oral Daily   Or  . bisacodyl  10 mg Rectal Daily  . Chlorhexidine Gluconate Cloth  6 each Topical QHS  . collagenase   Topical Daily  . famotidine  20 mg Oral BID  . insulin aspart  0-24 Units Subcutaneous Q4H  . insulin detemir  30 Units Subcutaneous QHS  . mouth rinse  15 mL Mouth Rinse BID  . metoCLOPramide (REGLAN) injection  10 mg Intravenous Q6H  . potassium chloride  40 mEq Oral Daily  . sodium chloride flush  10-40 mL Intracatheter Q12H  . spironolactone  25 mg Oral Daily  . Warfarin - Pharmacist Dosing Inpatient   Does not apply q1800    Infusions: . sodium chloride Stopped (09/03/17 0444)  . lactated ringers Stopped (08/19/17 1602)  . 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  Tolerating dysphagia 2 diet.  Ambulating with physical therapy short distances CXR improving with diuresis and mobilization Good VAD flows greater than 4 L/min at speed of 5200 RPM  I reviewed the LVAD parameters from today, and compared the results to the  patient's prior recorded data.  No programming changes were made.  The LVAD is functioning within specified parameters.  The patient performs LVAD self-test daily.  LVAD interrogation was negative for any significant power changes, alarms or PI events/speed drops.  LVAD equipment check completed and is in good working order.  Back-up equipment present.   LVAD education done on emergency procedures and precautions and reviewed exit site care.  Length of Stay: [place] [facility] 09/03/2017, 11:32 AM

## 2017-09-03 NOTE — Progress Notes (Signed)
Modified Barium Swallow Progress Note  Patient Details  Name: [name] [name] MRN: [id number] Date of Birth: 1950-04-17  Today's Date: 09/03/2017  Modified Barium Swallow completed.  Full report located under Chart Review in the Imaging Section.  Brief recommendations include the following:  Clinical Impression  Today's results are similar to [facility] 08/29/17. He exhibits mildly decreased oral coordination in manipulating and transiting bolus to posterior oral cavity. Timing of epiglottic deflection and laryngeal closure is delayed therefore nectar and honey thick barium continue to be aspirated during the swallow without sensation. As in initial study, smaller bolus size and chin tuck posture is effective in providing better control and timing during pharyngeal phase. Initially he required mod-max reminders for chin down posture. Pharyngeal residue was not significant during today's MBS. Recommend he continue honey thick liquids, Dys 2 texture, upright posture, FULL supervision for CHIN TUCK with liquids. Suspect premorbid dysphagia as described by wife (see progress note).      Swallow Evaluation Recommendations       SLP Diet Recommendations: Dysphagia 2 (Fine chop) solids;Honey thick liquids   Liquid Administration via: Cup   Medication Administration: Crushed with puree   Supervision: Patient able to self feed;Full supervision/cueing for compensatory strategies   Compensations: Slow rate;Small sips/bites;Chin tuck;Clear throat intermittently(chin tuck with liquids)   Postural Changes: Seated upright at 90 degrees   Oral Care Recommendations: Oral care BID        Houston Siren 09/03/2017,5:17 PM   Orbie Pyo [place].[employer] [phone]

## 2017-09-03 NOTE — Progress Notes (Signed)
Physical Therapy Treatment Patient Details Name: [name] [name] MRN: [id number] DOB: 1950/04/29 Today's Date: 09/03/2017    History of Present Illness Pt is a 68 y.o. male admitted 08/11/17 for milrinone s/p LVAD HM3 placement 6/18; return to OR same day for hematoma evacuation with delayed closure until 7/42, course complicated by cardiogenic shock, biventricular failure, cardioversions, VDRF. Extubated 6/28. PMhx: chronic systolic CHF due to ICM, [name], CAD, PAD, [name], HTN, HLD, [name]    PT Comments    Patient is making gradual progress toward PT goals and tolerated gait training of ~6-25ft X2 with +2 assist and 3rd person for chair follow. Continue to progress as tolerated.    Follow Up Recommendations  CIR;Supervision/Assistance - 24 hour     Equipment Recommendations  Rolling walker with 5" wheels;3in1 (PT)    Recommendations for Other Services       Precautions / Restrictions Precautions Precautions: Sternal;Fall Restrictions Weight Bearing Restrictions: Yes(sternal precautions)    Mobility  Bed Mobility Overal bed mobility: Needs Assistance Bed Mobility: Supine to Sit;Sit to Supine Rolling: Mod assist Sidelying to sit: +2 for physical assistance;Mod assist     Sit to sidelying: Mod assist;+2 for physical assistance General bed mobility comments: cues for sequencing and assist at trunk and bilat LE and to scoot hips to EOB with use of bed pad  Transfers Overall transfer level: Needs assistance Equipment used: 2 person hand held assist(Eva walker) Transfers: Sit to/from Stand;Stand Pivot Transfers Sit to Stand: Max assist;Mod assist;+2 physical assistance Stand pivot transfers: +2 physical assistance;Mod assist       General transfer comment: assist to power up into standing with patient using momentum and cues for hand/foot placement  Ambulation/Gait Ambulation/Gait assistance: Mod assist;+2 physical assistance;Max assist Gait  Distance (Feet): (~6-93ft X2) Assistive device: [place]on [place] walker) Gait Pattern/deviations: Step-through pattern;Decreased step length - right;Decreased step length - left;Drifts right/left;Wide base of support Gait velocity: decreased   General Gait Details: assistance required for balance and guiding AD; mutlimodal cues to posture; vc for decreasing weight through bilat UE, and sequencing with walker   Stairs             Wheelchair Mobility    Modified Rankin (Stroke Patients Only)       Balance Overall balance assessment: Needs assistance   Sitting balance-Leahy Scale: Fair       Standing balance-Leahy Scale: Poor                              Cognition Arousal/Alertness: Awake/alert Behavior During Therapy: Flat affect Overall Cognitive Status: Impaired/Different from baseline Area of Impairment: Attention;Following commands;Safety/judgement;Problem solving                   Current Attention Level: Sustained Memory: Decreased recall of precautions Following Commands: Follows one step commands inconsistently;Follows one step commands with increased time Safety/Judgement: Decreased awareness of safety;Decreased awareness of deficits   Problem Solving: Slow processing;Decreased initiation;Difficulty sequencing;Requires verbal cues;Requires tactile cues        Exercises      General Comments General comments (skin integrity, edema, etc.): VSS throughotu session; RN and therapy techn assisted during session      Pertinent Vitals/Pain Pain Assessment: Faces Faces Pain Scale: Hurts little more Pain Location: buttocks Pain Descriptors / Indicators: Sore;Grimacing Pain Intervention(s): Repositioned    Home Living  Prior Function            PT Goals (current goals can now be found in the care plan section) Acute Rehab PT Goals PT Goal Formulation: With patient Time For Goal Achievement: 09/10/17 Potential to  Achieve Goals: Good Progress towards PT goals: Progressing toward goals    Frequency    Min 3X/week      PT Plan Current plan remains appropriate    Co-evaluation              AM-PAC PT "6 Clicks" Daily Activity  Outcome Measure  Difficulty turning over in bed (including adjusting bedclothes, sheets and blankets)?: Unable Difficulty moving from lying on back to sitting on the side of the bed? : Unable Difficulty sitting down on and standing up from a chair with arms (e.g., wheelchair, bedside commode, etc,.)?: Unable Help needed moving to and from a bed to chair (including a wheelchair)?: A Lot Help needed walking in hospital room?: A Lot Help needed climbing 3-5 steps with a railing? : Total 6 Click Score: 8    End of Session Equipment Utilized During Treatment: Gait belt;Oxygen Activity Tolerance: Patient limited by fatigue Patient left: with call bell/phone within reach;in bed;with family/visitor present Nurse Communication: Mobility status PT Visit Diagnosis: Unsteadiness on feet (R26.81);Other abnormalities of gait and mobility (R26.89);Muscle weakness (generalized) (M62.81)     Time: 0370-9643 PT Time Calculation (min) (ACUTE ONLY): 50 min  Charges:  $Gait Training: 23-37 mins $Therapeutic Activity: 8-22 mins                    G Codes:       Earney Navy, PTA Pager: [phone]     Darliss Cheney 09/03/2017, 4:49 PM

## 2017-09-04 LAB — COMPREHENSIVE METABOLIC PANEL
ALBUMIN: 2.6 g/dL — AB (ref 3.5–5.0)
ALK PHOS: 81 U/L (ref 38–126)
ALT: 35 U/L (ref 0–44)
AST: 27 U/L (ref 15–41)
Anion gap: 8 (ref 5–15)
BILIRUBIN TOTAL: 1.4 mg/dL — AB (ref 0.3–1.2)
BUN: 46 mg/dL — AB (ref 8–23)
CO2: 19 mmol/L — ABNORMAL LOW (ref 22–32)
Calcium: 9 mg/dL (ref 8.9–10.3)
Chloride: 116 mmol/L — ABNORMAL HIGH (ref 98–111)
Creatinine, Ser: 1.58 mg/dL — ABNORMAL HIGH (ref 0.61–1.24)
GFR calc Af Amer: 51 mL/min — ABNORMAL LOW (ref >60)
GFR calc non Af Amer: 44 mL/min — ABNORMAL LOW (ref >60)
GLUCOSE: 193 mg/dL — AB (ref 70–99)
Potassium: 3.9 mmol/L (ref 3.5–5.1)
Sodium: 143 mmol/L (ref 135–145)
TOTAL PROTEIN: 7.5 g/dL (ref 6.5–8.1)

## 2017-09-04 LAB — GLUCOSE, CAPILLARY
GLUCOSE-CAPILLARY: 178 mg/dL — AB (ref 70–99)
GLUCOSE-CAPILLARY: 146 mg/dL — AB (ref 70–99)
Glucose-Capillary: 116 mg/dL — ABNORMAL HIGH (ref 70–99)
Glucose-Capillary: 133 mg/dL — ABNORMAL HIGH (ref 70–99)
Glucose-Capillary: 139 mg/dL — ABNORMAL HIGH (ref 70–99)
Glucose-Capillary: 92 mg/dL (ref 70–99)

## 2017-09-04 LAB — CBC
HEMATOCRIT: 29.2 % — AB (ref 39.0–52.0)
HEMOGLOBIN: 8.7 g/dL — AB (ref 13.0–17.0)
MCH: 25.6 pg — ABNORMAL LOW (ref 26.0–34.0)
MCHC: 29.8 g/dL — ABNORMAL LOW (ref 30.0–36.0)
MCV: 85.9 fL (ref 78.0–100.0)
Platelets: 465 10*3/uL — ABNORMAL HIGH (ref 150–400)
RBC: 3.4 MIL/uL — AB (ref 4.22–5.81)
RDW: 18.8 % — ABNORMAL HIGH (ref 11.5–15.5)
WBC: 9.4 10*3/uL (ref 4.0–10.5)

## 2017-09-04 LAB — LACTATE DEHYDROGENASE: LDH: 215 U/L — ABNORMAL HIGH (ref 98–192)

## 2017-09-04 LAB — COOXEMETRY PANEL
Carboxyhemoglobin: 1.8 % — ABNORMAL HIGH (ref 0.5–1.5)
Methemoglobin: 1.8 % — ABNORMAL HIGH (ref 0.0–1.5)
O2 Saturation: 54.7 %
Total hemoglobin: 9 g/dL — ABNORMAL LOW (ref 12.0–16.0)

## 2017-09-04 LAB — PROTIME-INR
INR: 2.58
Prothrombin Time: 27.5 seconds — ABNORMAL HIGH (ref 11.4–15.2)

## 2017-09-04 LAB — MAGNESIUM: Magnesium: 2 mg/dL (ref 1.7–2.4)

## 2017-09-04 MED ORDER — TORSEMIDE 20 MG PO TABS: 20.0000 mg | ORAL_TABLET | ORAL | Status: DC

## 2017-09-04 MED ORDER — WARFARIN 0.5 MG HALF TABLET
0.5000 mg | ORAL_TABLET | Freq: Once | ORAL | Status: AC
  Administered 2017-09-04: 0.5 mg via ORAL
  Filled 2017-09-04: qty 1

## 2017-09-04 NOTE — Progress Notes (Signed)
[place] for coumadin Indication: LVAD  Allergies  Allergen Reactions  . Penicillins Hives, Itching and Other (See Comments)    Has patient had a PCN reaction causing immediate rash, facial/tongue/throat swelling, SOB or lightheadedness with hypotension:# # Yes # # Has patient had a PCN reaction causing severe rash involving mucus membranes or skin necrosis: No Has patient had a PCN reaction that required hospitalization: No Has patient had a PCN reaction occurring within the last 10 years: No If all of the above answers are "NO", then may proceed with Cephalosporin use.     Patient Measurements: Height: 5\' 11"  (180.3 cm) Weight: 226 lb 13.7 oz (102.9 kg) IBW/kg (Calculated) : 75.3 Heparin Dosing Weight: 98.3 kg  Vital Signs: Temp: 97.7 F (36.5 C) (07/06 0746) Temp Source: Oral (07/06 0746) Pulse Rate: 77 (07/06 0400)  Labs: Recent Labs    09/02/17 0435 09/02/17 1259 09/03/17 0443 09/04/17 0314  HGB  --  8.9* 8.6* 8.7*  HCT  --  30.2* 28.5* 29.2*  PLT  --  526* 491* 465*  LABPROT 28.0*  --  27.5* 27.5*  INR 2.65  --  2.58 2.58  CREATININE 1.52*  --  1.47* 1.58*    Estimated Creatinine Clearance: 55.4 mL/min (A) (by C-G formula based on SCr of 1.58 mg/dL (H)).   Medical History: Past Medical History:  Diagnosis Date  . AICD (automatic cardioverter/defibrillator) present 02/05/2014   Upgrade to Medtronic biventricular ICD, serial number  [id number]   . Atrial flutter ([place]) 04/2012   s/p TEE-EPS+RFCA 04/2012  . CAD (coronary artery disease) 2330,0762 X 2    RCA-T, 70% PL (off CFX), 99% Prox LAD/90% Dist LAD, S/P TAXUS stent x 2  . CHF (congestive heart failure) ([place])   . Chronic anticoagulation   . Chronic systolic heart failure ([place])   . CKD (chronic kidney disease)   . Diabetic retinopathy ([place])   . DM type 2 (diabetes mellitus, type 2) ([facility])    insulin dependent  . HTN (hypertension)   . Hypercholesteremia    ablation  . ICD (implantable cardiac defibrillator) in place   . Ischemic cardiomyopathy March 2015   20-25% 2D   . Nephrolithiasis   . Ventricular tachycardia ([place])     Assessment: 34 yom who underwent LVAD placement on 6/17   INR unchanged and slightly above goal at 2.58. CBC stable, LDH stable at 200s. No s/sx of bleeding. On concurrent amiodarone, which can impact INR sensitivity.  LFTs have trended down to normal.   Goal of Therapy:  INR goal 2-2.5 Monitor platelets by anticoagulation protocol: Yes   Plan:   Repeat Warfarin 0.5 mg x 1 tonight. Monitor s/s bleeding Will provide warfarin education prior to discharge  Erin Hearing PharmD., BCPS Clinical Pharmacist 09/04/2017 7:58 AM

## 2017-09-04 NOTE — Progress Notes (Signed)
Patient ID: [name] [name], male   DOB: 1950-06-17, 67 y.o.   MRN: [id number] TCTS Evening Rounds:  Hemodynamically stable with MAP 70 on milrinone 0.25 CVP 13 Had two BM's and 600 cc urine output today.  Sitting up in chair talking with family.

## 2017-09-04 NOTE — Progress Notes (Addendum)
Patient ID: [name] [name], male   DOB: [birth year], 67 y.o.   MRN: [id number]     Advanced Heart Failure Rounding Note  PCP-Cardiologist: No primary care provider on file.   Subjective:    Events 6/17 Underwent HM-3 implant -> Unable to close chest with RV failure and high intrathoracic pressure 6/18 Taken back to OR for evacuation of hematoma. NO RVAD needed. Left sternum open  08/19/17 Taken back to OR for sternal closure 6/22 VT => amiodarone started.  6/24 VT/VF=>urgent cardioversion. 6/25 VT  =>urgent cardioversion  6/27 Extubated  Up to chair this am. More alert today. Denies SOB, orthopnea or PND.   Failed swallow study again yesterday. Still dysphagia-2.   On milrinone 0.25. Co-ox 55%. Torsemide restarted yesterday. Weight down 3 pounds to 225. (pre-op weight 238). INR 2.6. Creatinine stable 1.4-1.5 range  LVAD Interrogation HM 3: Speed: 5200 Flow: 4.1 PI: 3.0 Power: 4.0. VAD interrogated personally. Parameters stable.   Objective:   Weight Range: 102.2 kg (225 lb 4.8 oz) Body mass index is 31.42 kg/m.   Vital Signs:   Temp:  [97.5 F (36.4 C)-98.2 F (36.8 C)] 98 F (36.7 C) (07/06 0349) Pulse Rate:  [28-120] 77 (07/06 0400) Resp:  [14-29] 26 (07/06 0400) SpO2:  [90 %-98 %] 94 % (07/06 0400) Last BM Date: 09/03/17  Weight change: Filed Weights   09/01/17 0415 09/02/17 0500 09/03/17 0500  Weight: 100.7 kg (222 lb 0.1 oz) 103.6 kg (228 lb 6.4 oz) 102.2 kg (225 lb 4.8 oz)   Intake/Output:   Intake/Output Summary (Last 24 hours) at 09/04/2017 0637 Last data filed at 09/04/2017 0400 Gross per 24 hour  Intake 310.85 ml  Output 975 ml  Net -664.15 ml   Physical Exam   MAPs 70-80s General: Sitting in chair. Hoarse NAD.  HEENT: normal  Neck: supple. JVP not elevated.  Carotids 2+ bilat; no bruits. No lymphadenopathy or thryomegaly appreciated. Cor: LVAD hum.  Lungs: Clear. Abdomen: obese soft, nontender, non-distended. No hepatosplenomegaly. No bruits or  masses. Good bowel sounds. Driveline site clean. Anchor in place.  Extremities: no cyanosis, clubbing, rash. Warm no edema  Neuro: alert & oriented x 3. No focal deficits. Moves all 4 without problem    Telemetry   Afib 70-80s with PVCs, personally reviewed.   Labs    CBC Recent Labs    09/03/17 0443 09/04/17 0314  WBC 9.4 9.4  HGB 8.6* 8.7*  HCT 28.5* 29.2*  MCV 85.8 85.9  PLT 491* 786*   Basic Metabolic Panel Recent Labs    09/03/17 0443 09/04/17 0314  NA 144 143  K 3.9 3.9  CL 117* 116*  CO2 20* 19*  GLUCOSE 82 193*  BUN 40* 46*  CREATININE 1.47* 1.58*  CALCIUM 9.1 9.0  MG 2.2 2.0   Liver Function Tests Recent Labs    09/03/17 0443 09/04/17 0314  AST 27 27  ALT 37 35  ALKPHOS 70 81  BILITOT 1.5* 1.4*  PROT 7.4 7.5  ALBUMIN 2.7* 2.6*   No results for input(s): LIPASE, AMYLASE in the last 72 hours. Cardiac Enzymes No results for input(s): CKTOTAL, CKMB, CKMBINDEX, TROPONINI in the last 72 hours.  BNP: BNP (last 3 results) Recent Labs    08/17/17 0314 08/23/17 0024 08/30/17 0000  BNP 266.4* 450.5* 114.6*    ProBNP (last 3 results) Recent Labs    11/12/16 1559  PROBNP 388.0*     D-Dimer No results for input(s): DDIMER in the last 72 hours.  Hemoglobin A1C No results for input(s): HGBA1C in the last 72 hours. Fasting Lipid Panel No results for input(s): CHOL, HDL, LDLCALC, TRIG, CHOLHDL, LDLDIRECT in the last 72 hours. Thyroid Function Tests No results for input(s): TSH, T4TOTAL, T3FREE, THYROIDAB in the last 72 hours.  Invalid input(s): FREET3  Other results:   Imaging    Dg Swallowing Func-speech Pathology  Result Date: 09/03/2017 Objective Swallowing Evaluation: Type of Study: MBS-Modified Barium Swallow Study  Patient Details Name: [name] [name] MRN: [id number] Date of Birth: 1950/08/04 Today's Date: 09/03/2017 Time: SLP Start Time (ACUTE ONLY): 0165 -SLP Stop Time (ACUTE ONLY): 1335 SLP Time Calculation (min) (ACUTE ONLY): 22  min Past Medical History: Past Medical History: Diagnosis Date . AICD (automatic cardioverter/defibrillator) present 02/05/2014  Upgrade to Medtronic biventricular ICD, serial number  [id number]  . Atrial flutter ([place]) 04/2012  s/p TEE-EPS+RFCA 04/2012 . CAD (coronary artery disease) 5374,8270 X 2   RCA-T, 70% PL (off CFX), 99% Prox LAD/90% Dist LAD, S/P TAXUS stent x 2 . CHF (congestive heart failure) ([place])  . Chronic anticoagulation  . Chronic systolic heart failure ([place])  . CKD (chronic kidney disease)  . Diabetic retinopathy ([place])  . DM type 2 (diabetes mellitus, type 2) ([facility])   insulin dependent . HTN (hypertension)  . Hypercholesteremia   ablation . ICD (implantable cardiac defibrillator) in place  . Ischemic cardiomyopathy March 2015  20-25% 2D  . Nephrolithiasis  . Ventricular tachycardia [facility])  Past Surgical History: Past Surgical History: Procedure Laterality Date . ATRIAL FLUTTER ABLATION N/A 05/19/2012  Procedure: ATRIAL FLUTTER ABLATION;  Surgeon: Thompson Grayer, MD;  Location: [facility] CATH LAB;  Service: Cardiovascular;  Laterality: N/A; . BI-VENTRICULAR IMPLANTABLE CARDIOVERTER DEFIBRILLATOR UPGRADE N/A 02/05/2014  Procedure: BI-VENTRICULAR IMPLANTABLE CARDIOVERTER DEFIBRILLATOR UPGRADE;  Surgeon: Evans Lance, MD;  Location: [facility] CATH LAB;  Service: Cardiovascular;  Laterality: N/A; . BIV ICD GENERTAOR CHANGE OUT  02/05/2014  Upgrade to Medtronic biventricular ICD, serial number  [id number] by Dr. [name] . CARDIAC DEFIBRILLATOR PLACEMENT  2007   Medtronic Maximo VR, serial number [id number] . INSERTION OF IMPLANTABLE LEFT VENTRICULAR ASSIST DEVICE N/A 08/16/2017  Procedure: INSERTION OF IMPLANTABLE LEFT VENTRICULAR ASSIST DEVICE-HM3;  Surgeon: Ivin Poot, MD;  Location: [place];  Service: Open Heart Surgery;  Laterality: N/A; . IR FLUORO GUIDE CV LINE RIGHT  08/12/2017 . IR US GUIDE VASC ACCESS RIGHT  08/12/2017 . MEDIASTINAL EXPLORATION  08/17/2017  Procedure: MEDIASTINAL REXPLORATION with evacuation of  hematoma;  Surgeon: Prescott Gum, Collier Salina, MD;  Location: [facility] OR;  Service: Open Heart Surgery;; . PERCUTANEOUS CORONARY STENT INTERVENTION (PCI-S)  January 2002  PTCA/Stent Distal RCA . PERCUTANEOUS CORONARY STENT INTERVENTION (PCI-S)  June 2002  PTCA/Stent x 3 RCA, thrombolysis - failed . PERCUTANEOUS CORONARY STENT INTERVENTION (PCI-S)  July 2006  TAXUS stents to prox and distal LAD . RIGHT HEART CATH N/A 07/22/2017  Procedure: RIGHT HEART CATH;  Surgeon: Jolaine Artist, MD;  Location: [place] CV LAB;  Service: Cardiovascular;  Laterality: N/A; . RIGHT HEART CATH N/A 08/13/2017  Procedure: RIGHT HEART CATH - swan;  Surgeon: Jolaine Artist, MD;  Location: [place] CV LAB;  Service: Cardiovascular;  Laterality: N/A; . RIGHT/LEFT HEART CATH AND CORONARY ANGIOGRAPHY N/A 02/18/2017  Procedure: RIGHT/LEFT HEART CATH AND CORONARY ANGIOGRAPHY;  Surgeon: Jolaine Artist, MD;  Location: [place] CV LAB;  Service: Cardiovascular;  Laterality: N/A; . STERNAL CLOSURE N/A 08/19/2017  Procedure: STERNAL CLOSURE;  Surgeon: Ivin Poot, MD;  Location: [place];  Service: Thoracic;  Laterality: N/A; . TEE WITHOUT CARDIOVERSION N/A 08/16/2017  Procedure: TRANSESOPHAGEAL ECHOCARDIOGRAM (TEE);  Surgeon: Prescott Gum, Collier Salina, MD;  Location: [place];  Service: Open Heart Surgery;  Laterality: N/A; . TEE WITHOUT CARDIOVERSION N/A 08/19/2017  Procedure: TRANSESOPHAGEAL ECHOCARDIOGRAM (TEE);  Surgeon: Prescott Gum, Collier Salina, MD;  Location: [place];  Service: Thoracic;  Laterality: N/A; . TRICUSPID VALVE REPLACEMENT N/A 08/16/2017  Procedure: TRICUSPID VALVE REPAIR using Oletta Lamas MC3 Ring size 30;  Surgeon: Ivin Poot, MD;  Location: [place];  Service: Open Heart Surgery;  Laterality: N/A; HPI: 67 y.o. 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  Subjective: Pt arrives with RN Assessment / Plan /  Recommendation CHL IP CLINICAL IMPRESSIONS 09/03/2017 Clinical Impression Today's results are similar to [facility] 08/29/17. He exhibits mildly decreased oral coordination in manipulating and transiting bolus to posterior oral cavity. Timing of epiglottic deflection and laryngeal closure is delayed therefore nectar and honey thick barium continue to be aspirated during the swallow without sensation. As in initial study, smaller bolus size and chin tuck posture is effective in providing better control and timing during pharyngeal phase. Initially he required mod-max reminders for chin down posture. Pharyngeal residue was not significant during today's MBS. Recommend he continue honey thick liquids, Dys 2 texture, upright posture, FULL supervision for CHIN TUCK with liquids. Suspect premorbid dysphagia as described by wife (see progress note).    SLP Visit Diagnosis Dysphagia, oropharyngeal phase (R13.12) Attention and concentration deficit following -- Frontal lobe and executive function deficit following -- Impact on safety and function Moderate aspiration risk;Severe aspiration risk   CHL IP TREATMENT RECOMMENDATION 09/03/2017 Treatment Recommendations Therapy as outlined in treatment plan below   Prognosis 09/03/2017 Prognosis for Safe Diet Advancement Good Barriers to Reach Goals -- Barriers/Prognosis Comment -- CHL IP DIET RECOMMENDATION 09/03/2017 SLP Diet Recommendations Dysphagia 2 (Fine chop) solids;Honey thick liquids Liquid Administration via Cup Medication Administration Crushed with puree Compensations Slow rate;Small sips/bites;Chin tuck;Clear throat intermittently Postural Changes Seated upright at 90 degrees   CHL IP OTHER RECOMMENDATIONS 09/03/2017 Recommended Consults -- Oral Care Recommendations Oral care BID Other Recommendations --   CHL IP FOLLOW UP RECOMMENDATIONS 09/03/2017 Follow up Recommendations Skilled Nursing facility   [facility] IP FREQUENCY AND DURATION 09/03/2017 Speech Therapy Frequency (ACUTE ONLY) min  2x/week Treatment Duration 2 weeks      CHL IP ORAL PHASE 09/03/2017 Oral Phase Impaired Oral - Pudding Teaspoon -- Oral - Pudding Cup -- Oral - Honey Teaspoon -- Oral - Honey Cup Decreased bolus cohesion;Reduced posterior propulsion Oral - Nectar Teaspoon -- Oral - Nectar Cup Decreased bolus cohesion Oral - Nectar Straw -- Oral - Thin Teaspoon NT Oral - Thin Cup NT Oral - Thin Straw -- Oral - Puree NT Oral - Mech Soft NT Oral - Regular Delayed oral transit Oral - Multi-Consistency -- Oral - Pill -- Oral Phase - Comment --  CHL IP PHARYNGEAL PHASE 09/03/2017 Pharyngeal Phase Impaired Pharyngeal- Pudding Teaspoon -- Pharyngeal -- Pharyngeal- Pudding Cup -- Pharyngeal -- Pharyngeal- Honey Teaspoon WFL Pharyngeal Material does not enter airway Pharyngeal- Honey Cup Penetration/Aspiration during swallow;Reduced pharyngeal peristalsis Pharyngeal Material enters airway, passes BELOW cords without attempt by patient to eject out (silent aspiration);Material does not enter airway Pharyngeal- Nectar Teaspoon NT Pharyngeal -- Pharyngeal- Nectar Cup Penetration/Aspiration during swallow;Reduced pharyngeal peristalsis Pharyngeal Material enters airway, passes  BELOW cords without attempt by patient to eject out (silent aspiration) Pharyngeal- Nectar Straw -- Pharyngeal -- Pharyngeal- Thin Teaspoon NT Pharyngeal -- Pharyngeal- Thin Cup NT Pharyngeal -- Pharyngeal- Thin Straw -- Pharyngeal -- Pharyngeal- Puree NT Pharyngeal -- Pharyngeal- Mechanical Soft NT Pharyngeal -- Pharyngeal- Regular WFL Pharyngeal -- Pharyngeal- Multi-consistency -- Pharyngeal -- Pharyngeal- Pill -- Pharyngeal -- Pharyngeal Comment --  CHL IP CERVICAL ESOPHAGEAL PHASE 09/03/2017 Cervical Esophageal Phase WFL Pudding Teaspoon -- Pudding Cup -- Honey Teaspoon -- Honey Cup -- Nectar Teaspoon -- Nectar Cup -- Nectar Straw -- Thin Teaspoon -- Thin Cup -- Thin Straw -- Puree -- Mechanical Soft -- Regular -- Multi-consistency -- Pill -- Cervical Esophageal Comment --  No flowsheet data found. Houston Siren 09/03/2017, 5:16 PM Orbie Pyo Colvin Caroli.Ed CCC-SLP Pager [phone]                Medications:     Scheduled Medications: . amiodarone  200 mg Oral BID  . aspirin  81 mg Oral Daily  . bisacodyl  10 mg Oral Daily   Or  . bisacodyl  10 mg Rectal Daily  . Chlorhexidine Gluconate Cloth  6 each Topical QHS  . collagenase   Topical Daily  . famotidine  20 mg Oral BID  . insulin aspart  0-24 Units Subcutaneous Q4H  . insulin detemir  30 Units Subcutaneous QHS  . mouth rinse  15 mL Mouth Rinse BID  . metoCLOPramide (REGLAN) injection  10 mg Intravenous Q6H  . potassium chloride  40 mEq Oral Daily  . sodium chloride flush  10-40 mL Intracatheter Q12H  . spironolactone  25 mg Oral Daily  . Warfarin - Pharmacist Dosing Inpatient   Does not apply q1800    Infusions: . sodium chloride Stopped (09/03/17 0444)  . lactated ringers Stopped (08/19/17 1602)  . milrinone 0.25 mcg/kg/min (09/04/17 0400)    PRN Medications: acetaminophen (TYLENOL) oral liquid 160 mg/5 mL, clonazepam, hydrALAZINE, lactated ringers, levalbuterol, ondansetron (ZOFRAN) IV, oxyCODONE, RESOURCE THICKENUP CLEAR, sodium chloride flush    Patient Profile  SIPRIANO FENDLEY is a 67 y.o. 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   Directly admitted with persistent low cardiac output for milrinone initiation for home.  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 Acute/Chronic systolic CHF with biventricular failure-> cardiogenic shock: Echo  08/13/2017 EF 20-25%. s/p  Medtronic BiV ICD in place. Cath 12/18 with stable 1v CAD.  s/p HM-3 implant 6/17. Unable to close chest due to high-intrathoracic pressures and RV failure. Taken back to OR 08/17/17 with evacuation of hematoma with improvement.   - Extubated 6/28  - Coox 54.7% this am on milrinone  0.25 mcg/kg/min. Will continue - CVP 11-12 - Weight well below baseline. Will dose torsemide at 20 qod. Can hold as needed.   for now 2. VAD - s/p HM-3 implant 6/17  - VAD interrogated personally. Parameters stable. - INR 2.58.  Discussed dosing with PharmD personally. - Continue asa 81 mg daily.  - LDH 215 - Ramp echo Monday 3. Acute respiratory failure post-op - Stable on [place].  4. Acute on CKD II-III:  -Creatinine 1.47 -> 1.58 today.   5. [name]/VF: Recurrent VT on 6/22 - S/P VF 6/24 emergent bedside cardioversion.  - S/P VT 6/26 emergent bedside cardioversion.  - VT quiescent  on amiodarone gtt.  - Keep K > 4.0 Mg > 2.0.   6. AFL - S/p previous ablation.  - Rate controlled on po amio. Well tolerated. Likely cvan stop amio soon.  7. [name]: Recent cath with stable CAD as above - No s/s ischemia. No change to current plan.   8. [name]: Recent A1c 8.3 on 6/3.  - Have adjusted SSI with low sugars. - No change to current plan.   9. Anemia:  - Post-op.  Got 1 unit PRBCs 6/22 and 6/25. - Hgb stable at 8.7. Follow.  10. Fevers  - Afebrile. Cx negative.  - Remains on meropenem. Can likely stop soon.  11. Deconditioning - Severe. Need to mobilize with PT. CIR following for possible admission early next week.  12. Dysphagia - Failed repeat swallow study 7/5. Remains dysphagia-2 diet 14. Hypernatremia - In setting of dysphagia 2 diet with thickened liquids so access to free water is reduced.  - Resolved  Transfer to [place]  Length of Stay: 24  Glori Bickers, MD  09/04/2017, 6:37 AM  Advanced Heart Failure Team Pager [phone] (M-F; 7a - 4p)  Please contact [facility] for night-coverage after hours (4p -7a ) and weekends on [email or web address]

## 2017-09-04 NOTE — Progress Notes (Signed)
Patient ID: [name] [name], male   DOB: 12-Sep-1950, 67 y.o.   MRN: [id number] HeartMate 3 Rounding Note  Subjective:    Up in chair. Ate breakfast. No complaints  Remains on Milrinone 0.25 with Co-ox 55%.  -600 cc yesterday. Wt up 2 lbs.  LVAD INTERROGATION:  HeartMate IIl LVAD:  Flow 4.1 liters/min, speed 5200, power 4.0, PI 3.0.    Objective:    Vital Signs:   Temp:  [97.5 F (36.4 C)-98 F (36.7 C)] 97.7 F (36.5 C) (07/06 0746) Pulse Rate:  [28-100] 76 (07/06 0900) Resp:  [19-29] 23 (07/06 0900) BP: (113)/(65) 113/65 (07/06 0900) SpO2:  [90 %-98 %] 96 % (07/06 0900) Weight:  [102.9 kg (226 lb 13.7 oz)] 102.9 kg (226 lb 13.7 oz) (07/06 0600) Last BM Date: 09/03/17 Mean arterial Pressure 70-80's  Intake/Output:   Intake/Output Summary (Last 24 hours) at 09/04/2017 1004 Last data filed at 09/04/2017 0900 Gross per 24 hour  Intake 896.19 ml  Output 975 ml  Net -78.81 ml     Physical Exam: General:  Well appearing. No resp difficulty Cor: LVAD hum present. Lungs: clear Abdomen: soft, nontender, nondistended. No hepatosplenomegaly. No bruits or masses. Good bowel sounds. Drive line dressing intact with anchor in place. Extremities: no edema Neuro: alert & orientedx3, moves all 4 extremities w/o difficulty. Affect pleasant  Telemetry: Atrial fib 70's  Labs: Basic Metabolic Panel: Recent Labs  Lab 08/31/17 0358 09/01/17 0400 09/02/17 0435 09/03/17 0443 09/04/17 0314  NA 141 144 146* 144 143  K 3.6 4.4 4.6 3.9 3.9  CL 111 115* 117* 117* 116*  CO2 24 22 20* 20* 19*  GLUCOSE 117* 91 130* 82 193*  BUN 38* 38* 42* 40* 46*  CREATININE 1.30* 1.31* 1.52* 1.47* 1.58*  CALCIUM 9.0 9.2 9.6 9.1 9.0  MG 2.3 2.3 2.4 2.2 2.0    Liver Function Tests: Recent Labs  Lab 08/31/17 0358 09/01/17 0400 09/02/17 0435 09/03/17 0443 09/04/17 0314  AST 48* 37 28 27 27   ALT 73* 59* 45* 37 35  ALKPHOS 73 79 81 70 81  BILITOT 1.7* 1.4* 1.5* 1.5* 1.4*  PROT 7.4 7.6 7.8 7.4  7.5  ALBUMIN 2.3* 2.4* 2.9* 2.7* 2.6*   No results for input(s): LIPASE, AMYLASE in the last 168 hours. No results for input(s): AMMONIA in the last 168 hours.  CBC: Recent Labs  Lab 08/31/17 0358 09/01/17 0829 09/02/17 1259 09/03/17 0443 09/04/17 0314  WBC 12.7* 12.6* 11.9* 9.4 9.4  HGB 9.2* 9.4* 8.9* 8.6* 8.7*  HCT 30.2* 30.8* 30.2* 28.5* 29.2*  MCV 84.6 84.8 87.8 85.8 85.9  PLT 547* 564* 526* 491* 465*    INR: Recent Labs  Lab 08/31/17 0358 09/01/17 0400 09/02/17 0435 09/03/17 0443 09/04/17 0314  INR 2.67 2.61 2.65 2.58 2.58    Other results:  EKG:   Imaging: Dg Swallowing Func-speech Pathology  Result Date: 09/03/2017 Objective Swallowing Evaluation: Type of Study: MBS-Modified Barium Swallow Study  Patient Details Name: [name] [name] MRN: [id number] Date of Birth: 04-14-1950 Today's Date: 09/03/2017 Time: SLP Start Time (ACUTE ONLY): 8850 -SLP Stop Time (ACUTE ONLY): 1335 SLP Time Calculation (min) (ACUTE ONLY): 22 min Past Medical History: Past Medical History: Diagnosis Date . AICD (automatic cardioverter/defibrillator) present 02/05/2014  Upgrade to Medtronic biventricular ICD, serial number  [id number]  . Atrial flutter ([place]) 04/2012  s/p TEE-EPS+RFCA 04/2012 . CAD (coronary artery disease) 2774,1287 X 2   RCA-T, 70% PL (off CFX), 99% Prox LAD/90% Dist LAD,  S/P TAXUS stent x 2 . CHF (congestive heart failure) ([place])  . Chronic anticoagulation  . Chronic systolic heart failure ([place])  . CKD (chronic kidney disease)  . Diabetic retinopathy ([place])  . DM type 2 (diabetes mellitus, type 2) (H[facility])   insulin dependent . HTN (hypertension)  . Hypercholesteremia   ablation . ICD (implantable cardiac defibrillator) in place  . Ischemic cardiomyopathy March 2015  20-25% 2D  . Nephrolithiasis  . Ventricular tachycardia [facility])  Past Surgical History: Past Surgical History: Procedure Laterality Date . ATRIAL FLUTTER ABLATION N/A 05/19/2012  Procedure: ATRIAL FLUTTER ABLATION;  Surgeon:  Thompson Grayer, MD;  Location: [facility] CATH LAB;  Service: Cardiovascular;  Laterality: N/A; . BI-VENTRICULAR IMPLANTABLE CARDIOVERTER DEFIBRILLATOR UPGRADE N/A 02/05/2014  Procedure: BI-VENTRICULAR IMPLANTABLE CARDIOVERTER DEFIBRILLATOR UPGRADE;  Surgeon: Evans Lance, MD;  Location: [facility] CATH LAB;  Service: Cardiovascular;  Laterality: N/A; . BIV ICD GENERTAOR CHANGE OUT  02/05/2014  Upgrade to Medtronic biventricular ICD, serial number  [id number] by Dr. [name] . CARDIAC DEFIBRILLATOR PLACEMENT  2007   Medtronic Maximo VR, serial number [id number] . INSERTION OF IMPLANTABLE LEFT VENTRICULAR ASSIST DEVICE N/A 08/16/2017  Procedure: INSERTION OF IMPLANTABLE LEFT VENTRICULAR ASSIST DEVICE-HM3;  Surgeon: Ivin Poot, MD;  Location: [place];  Service: Open Heart Surgery;  Laterality: N/A; . IR FLUORO GUIDE CV LINE RIGHT  08/12/2017 . IR US GUIDE VASC ACCESS RIGHT  08/12/2017 . MEDIASTINAL EXPLORATION  08/17/2017  Procedure: MEDIASTINAL REXPLORATION with evacuation of hematoma;  Surgeon: Prescott Gum, Collier Salina, MD;  Location[facility] OR;  Service: Open Heart Surgery;; . PERCUTANEOUS CORONARY STENT INTERVENTION (PCI-S)  January 2002  PTCA/Stent Distal RCA . PERCUTANEOUS CORONARY STENT INTERVENTION (PCI-S)  June 2002  PTCA/Stent x 3 RCA, thrombolysis - failed . PERCUTANEOUS CORONARY STENT INTERVENTION (PCI-S)  July 2006  TAXUS stents to prox and distal LAD . RIGHT HEART CATH N/A 07/22/2017  Procedure: RIGHT HEART CATH;  Surgeon: Jolaine Artist, MD;  Location: [place] CV LAB;  Service: Cardiovascular;  Laterality: N/A; . RIGHT HEART CATH N/A 08/13/2017  Procedure: RIGHT HEART CATH - swan;  Surgeon: Jolaine Artist, MD;  Location: [place] CV LAB;  Service: Cardiovascular;  Laterality: N/A; . RIGHT/LEFT HEART CATH AND CORONARY ANGIOGRAPHY N/A 02/18/2017  Procedure: RIGHT/LEFT HEART CATH AND CORONARY ANGIOGRAPHY;  Surgeon: Jolaine Artist, MD;  Location: [place] CV LAB;  Service: Cardiovascular;  Laterality: N/A; .  STERNAL CLOSURE N/A 08/19/2017  Procedure: STERNAL CLOSURE;  Surgeon: Ivin Poot, MD;  Location: [place];  Service: Thoracic;  Laterality: N/A; . TEE WITHOUT CARDIOVERSION N/A 08/16/2017  Procedure: TRANSESOPHAGEAL ECHOCARDIOGRAM (TEE);  Surgeon: Prescott Gum, Collier Salina, MD;  Location: [place];  Service: Open Heart Surgery;  Laterality: N/A; . TEE WITHOUT CARDIOVERSION N/A 08/19/2017  Procedure: TRANSESOPHAGEAL ECHOCARDIOGRAM (TEE);  Surgeon: Prescott Gum, Collier Salina, MD;  Location: [place];  Service: Thoracic;  Laterality: N/A; . TRICUSPID VALVE REPLACEMENT N/A 08/16/2017  Procedure: TRICUSPID VALVE REPAIR using Oletta Lamas MC3 Ring size 30;  Surgeon: Ivin Poot, MD;  Location: [place];  Service: Open Heart Surgery;  Laterality: N/A; HPI: 67 y.o. 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  Subjective: Pt arrives with RN Assessment / Plan / Recommendation CHL IP CLINICAL IMPRESSIONS 09/03/2017 Clinical Impression Today's results are similar to [facility] 08/29/17. He exhibits mildly decreased oral coordination in manipulating and  transiting bolus to posterior oral cavity. Timing of epiglottic deflection and laryngeal closure is delayed therefore nectar and honey thick barium continue to be aspirated during the swallow without sensation. As in initial study, smaller bolus size and chin tuck posture is effective in providing better control and timing during pharyngeal phase. Initially he required mod-max reminders for chin down posture. Pharyngeal residue was not significant during today's MBS. Recommend he continue honey thick liquids, Dys 2 texture, upright posture, FULL supervision for CHIN TUCK with liquids. Suspect premorbid dysphagia as described by wife (see progress note).    SLP Visit Diagnosis Dysphagia, oropharyngeal phase (R13.12) Attention and concentration deficit following -- Frontal lobe and  executive function deficit following -- Impact on safety and function Moderate aspiration risk;Severe aspiration risk   CHL IP TREATMENT RECOMMENDATION 09/03/2017 Treatment Recommendations Therapy as outlined in treatment plan below   Prognosis 09/03/2017 Prognosis for Safe Diet Advancement Good Barriers to Reach Goals -- Barriers/Prognosis Comment -- CHL IP DIET RECOMMENDATION 09/03/2017 SLP Diet Recommendations Dysphagia 2 (Fine chop) solids;Honey thick liquids Liquid Administration via Cup Medication Administration Crushed with puree Compensations Slow rate;Small sips/bites;Chin tuck;Clear throat intermittently Postural Changes Seated upright at 90 degrees   CHL IP OTHER RECOMMENDATIONS 09/03/2017 Recommended Consults -- Oral Care Recommendations Oral care BID Other Recommendations --   CHL IP FOLLOW UP RECOMMENDATIONS 09/03/2017 Follow up Recommendations Skilled Nursing facility   [facility] IP FREQUENCY AND DURATION 09/03/2017 Speech Therapy Frequency (ACUTE ONLY) min 2x/week Treatment Duration 2 weeks      CHL IP ORAL PHASE 09/03/2017 Oral Phase Impaired Oral - Pudding Teaspoon -- Oral - Pudding Cup -- Oral - Honey Teaspoon -- Oral - Honey Cup Decreased bolus cohesion;Reduced posterior propulsion Oral - Nectar Teaspoon -- Oral - Nectar Cup Decreased bolus cohesion Oral - Nectar Straw -- Oral - Thin Teaspoon NT Oral - Thin Cup NT Oral - Thin Straw -- Oral - Puree NT Oral - Mech Soft NT Oral - Regular Delayed oral transit Oral - Multi-Consistency -- Oral - Pill -- Oral Phase - Comment --  CHL IP PHARYNGEAL PHASE 09/03/2017 Pharyngeal Phase Impaired Pharyngeal- Pudding Teaspoon -- Pharyngeal -- Pharyngeal- Pudding Cup -- Pharyngeal -- Pharyngeal- Honey Teaspoon WFL Pharyngeal Material does not enter airway Pharyngeal- Honey Cup Penetration/Aspiration during swallow;Reduced pharyngeal peristalsis Pharyngeal Material enters airway, passes BELOW cords without attempt by patient to eject out (silent aspiration);Material does not enter  airway Pharyngeal- Nectar Teaspoon NT Pharyngeal -- Pharyngeal- Nectar Cup Penetration/Aspiration during swallow;Reduced pharyngeal peristalsis Pharyngeal Material enters airway, passes BELOW cords without attempt by patient to eject out (silent aspiration) Pharyngeal- Nectar Straw -- Pharyngeal -- Pharyngeal- Thin Teaspoon NT Pharyngeal -- Pharyngeal- Thin Cup NT Pharyngeal -- Pharyngeal- Thin Straw -- Pharyngeal -- Pharyngeal- Puree NT Pharyngeal -- Pharyngeal- Mechanical Soft NT Pharyngeal -- Pharyngeal- Regular WFL Pharyngeal -- Pharyngeal- Multi-consistency -- Pharyngeal -- Pharyngeal- Pill -- Pharyngeal -- Pharyngeal Comment --  CHL IP CERVICAL ESOPHAGEAL PHASE 09/03/2017 Cervical Esophageal Phase WFL Pudding Teaspoon -- Pudding Cup -- Honey Teaspoon -- Honey Cup -- Nectar Teaspoon -- Nectar Cup -- Nectar Straw -- Thin Teaspoon -- Thin Cup -- Thin Straw -- Puree -- Mechanical Soft -- Regular -- Multi-consistency -- Pill -- Cervical Esophageal Comment -- No flowsheet data found. Houston Siren 09/03/2017, 5:16 PM Orbie Pyo Colvin Caroli.Ed CCC-SLP Pager [phone]                 Medications:     Scheduled Medications: . amiodarone  200 mg Oral BID  .  aspirin  81 mg Oral Daily  . bisacodyl  10 mg Oral Daily   Or  . bisacodyl  10 mg Rectal Daily  . Chlorhexidine Gluconate Cloth  6 each Topical QHS  . collagenase   Topical Daily  . famotidine  20 mg Oral BID  . insulin aspart  0-24 Units Subcutaneous Q4H  . insulin detemir  30 Units Subcutaneous QHS  . mouth rinse  15 mL Mouth Rinse BID  . potassium chloride  40 mEq Oral Daily  . sodium chloride flush  10-40 mL Intracatheter Q12H  . spironolactone  25 mg Oral Daily  . [START ON 09/05/2017] torsemide  20 mg Oral QODAY  . warfarin  0.5 mg Oral ONCE-1800  . Warfarin - Pharmacist Dosing Inpatient   Does not apply q1800     Infusions: . sodium chloride Stopped (09/03/17 0444)  . lactated ringers Stopped (08/19/17 1602)  . milrinone 0.25  mcg/kg/min (09/04/17 0900)     PRN Medications:  acetaminophen (TYLENOL) oral liquid 160 mg/5 mL, clonazepam, hydrALAZINE, lactated ringers, levalbuterol, ondansetron (ZOFRAN) IV, oxyCODONE, RESOURCE THICKENUP CLEAR, sodium chloride flush   Assessment/Plan/Discussion:    Acute/Chronic systolic CHF with biventricular failure and cardiogenic shock S/P HM 3 LVAD on 08/16/2017 with sternum left open due to RV failure S/P sternal closure on 08/17/2017 He remains hemodynamically stable on milrinone 0.25 with Co-ox 55 and CVP 11.  INR therapeutic and Coumadin managed by pharmacy.  Acute on chronic kidney disease. Creat up slightly today to 1.58. Diuretic decreased since below preop wt.  [name]/VF: none in a couple weeks. Continue amiodarone.  Dysphagia: did not pass MBS yesterday and remains on Dys 2 diet. This will improve with time.  Continue PT, IS   I reviewed the LVAD parameters from today, and compared the results to the patient's prior recorded data.  No programming changes were made.  The LVAD is functioning within specified parameters.  The patient performs LVAD self-test daily.  LVAD interrogation was negative for any significant power changes, alarms or PI events/speed drops.  LVAD equipment check completed and is in good working order.  Back-up equipment present.   LVAD education done on emergency procedures and precautions and reviewed exit site care.  Length of Stay: [address]  Fernande Boyden [facility] 09/04/2017, 10:04 AM

## 2017-09-05 LAB — CBC
HEMATOCRIT: 28.8 % — AB (ref 39.0–52.0)
Hemoglobin: 8.7 g/dL — ABNORMAL LOW (ref 13.0–17.0)
MCH: 25.9 pg — AB (ref 26.0–34.0)
MCHC: 30.2 g/dL (ref 30.0–36.0)
MCV: 85.7 fL (ref 78.0–100.0)
PLATELETS: 429 10*3/uL — AB (ref 150–400)
RBC: 3.36 MIL/uL — AB (ref 4.22–5.81)
RDW: 18.9 % — ABNORMAL HIGH (ref 11.5–15.5)
WBC: 8.7 10*3/uL (ref 4.0–10.5)

## 2017-09-05 LAB — COMPREHENSIVE METABOLIC PANEL
ALT: 30 U/L (ref 0–44)
AST: 27 U/L (ref 15–41)
Albumin: 2.5 g/dL — ABNORMAL LOW (ref 3.5–5.0)
Alkaline Phosphatase: 73 U/L (ref 38–126)
Anion gap: 7 (ref 5–15)
BILIRUBIN TOTAL: 1.8 mg/dL — AB (ref 0.3–1.2)
BUN: 34 mg/dL — AB (ref 8–23)
CALCIUM: 8.9 mg/dL (ref 8.9–10.3)
CHLORIDE: 114 mmol/L — AB (ref 98–111)
CO2: 20 mmol/L — ABNORMAL LOW (ref 22–32)
CREATININE: 1.29 mg/dL — AB (ref 0.61–1.24)
GFR calc non Af Amer: 56 mL/min — ABNORMAL LOW (ref >60)
Glucose, Bld: 110 mg/dL — ABNORMAL HIGH (ref 70–99)
Potassium: 3.7 mmol/L (ref 3.5–5.1)
Sodium: 141 mmol/L (ref 135–145)
Total Protein: 7.4 g/dL (ref 6.5–8.1)

## 2017-09-05 LAB — GLUCOSE, CAPILLARY
GLUCOSE-CAPILLARY: 117 mg/dL — AB (ref 70–99)
Glucose-Capillary: 85 mg/dL (ref 70–99)
Glucose-Capillary: 100 mg/dL — ABNORMAL HIGH (ref 70–99)
Glucose-Capillary: 118 mg/dL — ABNORMAL HIGH (ref 70–99)
Glucose-Capillary: 189 mg/dL — ABNORMAL HIGH (ref 70–99)

## 2017-09-05 LAB — MAGNESIUM: Magnesium: 2.1 mg/dL (ref 1.7–2.4)

## 2017-09-05 LAB — PROTIME-INR
INR: 2.25
Prothrombin Time: 24.7 seconds — ABNORMAL HIGH (ref 11.4–15.2)

## 2017-09-05 LAB — COOXEMETRY PANEL
Carboxyhemoglobin: 2.2 % — ABNORMAL HIGH (ref 0.5–1.5)
Methemoglobin: 0.9 % (ref 0.0–1.5)
O2 Saturation: 51.4 %
Total hemoglobin: 9.1 g/dL — ABNORMAL LOW (ref 12.0–16.0)

## 2017-09-05 LAB — LACTATE DEHYDROGENASE: LDH: 208 U/L — ABNORMAL HIGH (ref 98–192)

## 2017-09-05 MED ORDER — POTASSIUM CHLORIDE 20 MEQ PO PACK
20.0000 meq | PACK | Freq: Once | ORAL | Status: AC
  Administered 2017-09-05: 20 meq via ORAL
  Filled 2017-09-05: qty 1

## 2017-09-05 MED ORDER — TORSEMIDE 20 MG PO TABS
40.0000 mg | ORAL_TABLET | Freq: Every day | ORAL | Status: DC
  Administered 2017-09-05 – 2017-09-07 (×3): 40 mg via ORAL
  Filled 2017-09-05 (×3): qty 2

## 2017-09-05 MED ORDER — WARFARIN SODIUM 1 MG PO TABS
1.0000 mg | ORAL_TABLET | Freq: Once | ORAL | Status: AC
  Administered 2017-09-05: 1 mg via ORAL
  Filled 2017-09-05: qty 1

## 2017-09-05 MED ORDER — AMIODARONE HCL 200 MG PO TABS
200.0000 mg | ORAL_TABLET | Freq: Every day | ORAL | Status: DC
  Administered 2017-09-05 – 2017-09-06 (×2): 200 mg via ORAL
  Filled 2017-09-05 (×2): qty 1

## 2017-09-05 NOTE — Plan of Care (Signed)
  Problem: Clinical Measurements: Goal: Ability to maintain clinical measurements within normal limits will improve Outcome: Progressing Goal: Will remain free from infection Outcome: Progressing Goal: Respiratory complications will improve Outcome: Progressing Goal: Cardiovascular complication will be avoided Outcome: Progressing   Problem: Activity: Goal: Risk for activity intolerance will decrease Outcome: Progressing   Problem: Coping: Goal: Level of anxiety will decrease Outcome: Progressing   Problem: Elimination: Goal: Will not experience complications related to bowel motility Outcome: Progressing Goal: Will not experience complications related to urinary retention Outcome: Progressing   Problem: Pain Managment: Goal: General experience of comfort will improve Outcome: Progressing   Problem: Safety: Goal: Ability to remain free from injury will improve Outcome: Progressing   Problem: Spiritual Needs Goal: Ability to function at adequate level Outcome: Progressing   Problem: Activity: Goal: Capacity to carry out activities will improve Outcome: Progressing   Problem: Cardiac: Goal: Ability to achieve and maintain adequate cardiopulmonary perfusion will improve Outcome: Progressing   Problem: Cardiac: Goal: Ability to maintain an adequate cardiac output will improve Outcome: Progressing   Problem: Fluid Volume: Goal: Risk for excess fluid volume will decrease Outcome: Progressing   Problem: Respiratory: Goal: Will regain and/or maintain adequate ventilation Outcome: Progressing

## 2017-09-05 NOTE — Progress Notes (Signed)
[place] for coumadin Indication: LVAD  Allergies  Allergen Reactions  . Penicillins Hives, Itching and Other (See Comments)    Has patient had a PCN reaction causing immediate rash, facial/tongue/throat swelling, SOB or lightheadedness with hypotension:# # Yes # # Has patient had a PCN reaction causing severe rash involving mucus membranes or skin necrosis: No Has patient had a PCN reaction that required hospitalization: No Has patient had a PCN reaction occurring within the last 10 years: No If all of the above answers are "NO", then may proceed with Cephalosporin use.     Patient Measurements: Height: 5\' 11"  (180.3 cm) Weight: 225 lb 15.5 oz (102.5 kg) IBW/kg (Calculated) : 75.3 Heparin Dosing Weight: 98.3 kg  Vital Signs: Temp: 97.8 F (36.6 C) (07/07 1131) Temp Source: Oral (07/07 1131) BP: 91/77 (07/07 1147) Pulse Rate: 74 (07/07 1147)  Labs: Recent Labs    09/03/17 0443 09/04/17 0314 09/05/17 0443  HGB 8.6* 8.7* 8.7*  HCT 28.5* 29.2* 28.8*  PLT 491* 465* 429*  LABPROT 27.5* 27.5* 24.7*  INR 2.58 2.58 2.25  CREATININE 1.47* 1.58* 1.29*    Estimated Creatinine Clearance: 67.7 mL/min (A) (by C-G formula based on SCr of 1.29 mg/dL (H)).   Medical History: Past Medical History:  Diagnosis Date  . AICD (automatic cardioverter/defibrillator) present 02/05/2014   Upgrade to Medtronic biventricular ICD, serial number  [id number]   . Atrial flutter ([place]) 04/2012   s/p TEE-EPS+RFCA 04/2012  . CAD (coronary artery disease) 1610,9604 X 2    RCA-T, 70% PL (off CFX), 99% Prox LAD/90% Dist LAD, S/P TAXUS stent x 2  . CHF (congestive heart failure) ([place])   . Chronic anticoagulation   . Chronic systolic heart failure ([place])   . CKD (chronic kidney disease)   . Diabetic retinopathy ([place])   . DM type 2 (diabetes mellitus, type 2) ([facility])    insulin dependent  . HTN (hypertension)   . Hypercholesteremia    ablation  . ICD  (implantable cardiac defibrillator) in place   . Ischemic cardiomyopathy March 2015   20-25% 2D   . Nephrolithiasis   . Ventricular tachycardia ([place])     Assessment: 20 yom who underwent LVAD placement on 6/17  INR down slightly to 2.25. CBC stable, LDH stable at 200s. No s/sx of bleeding. On concurrent amiodarone, which can impact INR sensitivity.  LFTs have trended down to normal.   Goal of Therapy:  INR goal 2-2.5 Monitor platelets by anticoagulation protocol: Yes   Plan:   Warfarin 1 mg tonight. Monitor s/s bleeding Will provide warfarin education prior to discharge  Erin Hearing PharmD., BCPS Clinical Pharmacist 09/05/2017 12:11 PM

## 2017-09-05 NOTE — Progress Notes (Signed)
Patient ID: [name] [name], male   DOB: 01-04-1951, 67 y.o.   MRN: [id number]     Advanced Heart Failure Rounding Note  PCP-Cardiologist: No primary care provider on file.   Subjective:    Events 6/17 Underwent HM-3 implant -> Unable to close chest with RV failure and high intrathoracic pressure 6/18 Taken back to OR for evacuation of hematoma. NO RVAD needed. Left sternum open  08/19/17 Taken back to OR for sternal closure 6/22 VT => amiodarone started.  6/24 VT/VF=>urgent cardioversion. 6/25 VT  =>urgent cardioversion  6/27 Extubated  Felt "tired" and "weak" yesterday, says he feels better this morning.  Still in bed.   Failed swallow study again. Still dysphagia-2.   On milrinone 0.25. Co-ox 51%. CVP 12-13 on my check.  INR 2.25, MAP 70s.    LVAD Interrogation HM 3: Speed: 5200 Flow: 4 PI: 3.3 Power: 3.5. No PI events. VAD interrogated personally. Parameters stable.   Objective:   Weight Range: 225 lb 15.5 oz (102.5 kg) Body mass index is 31.52 kg/m.   Vital Signs:   Temp:  [97.2 F (36.2 C)-98.5 F (36.9 C)] 98.5 F (36.9 C) (07/07 0400) Pulse Rate:  [29-100] 74 (07/07 0726) Resp:  [13-31] 23 (07/07 0726) BP: (78-113)/(43-78) 88/72 (07/07 0726) SpO2:  [91 %-98 %] 92 % (07/07 0726) Weight:  [225 lb 15.5 oz (102.5 kg)] 225 lb 15.5 oz (102.5 kg) (07/07 0500) Last BM Date: 09/04/17  Weight change: Filed Weights   09/03/17 0500 09/04/17 0600 09/05/17 0500  Weight: 225 lb 4.8 oz (102.2 kg) 226 lb 13.7 oz (102.9 kg) 225 lb 15.5 oz (102.5 kg)   Intake/Output:   Intake/Output Summary (Last 24 hours) at 09/05/2017 0754 Last data filed at 09/05/2017 0700 Gross per 24 hour  Intake 1584.95 ml  Output 1325 ml  Net 259.95 ml   Physical Exam   MAPs 70s General: Well appearing this am. NAD.  HEENT: Normal. Neck: Supple, JVP 7-8 cm. Carotids OK.  Cardiac:  Mechanical heart sounds with LVAD hum present.  Lungs:  CTAB, normal effort.  Abdomen:  NT, ND, no HSM. No  bruits or masses. +BS  LVAD exit site: Well-healed and incorporated. Dressing dry and intact. No erythema or drainage. Stabilization device present and accurately applied. Driveline dressing changed daily per sterile technique. Extremities:  Warm and dry. No cyanosis, clubbing, rash, or edema.  Neuro:  Alert & oriented x 3. Cranial nerves grossly intact. Moves all 4 extremities w/o difficulty. Affect pleasant    Telemetry   Afib 70s, personally reviewed.   Labs    CBC Recent Labs    09/04/17 0314 09/05/17 0443  WBC 9.4 8.7  HGB 8.7* 8.7*  HCT 29.2* 28.8*  MCV 85.9 85.7  PLT 465* 364*   Basic Metabolic Panel Recent Labs    09/04/17 0314 09/05/17 0443  NA 143 141  K 3.9 3.7  CL 116* 114*  CO2 19* 20*  GLUCOSE 193* 110*  BUN 46* 34*  CREATININE 1.58* 1.29*  CALCIUM 9.0 8.9  MG 2.0 2.1   Liver Function Tests Recent Labs    09/04/17 0314 09/05/17 0443  AST 27 27  ALT 35 30  ALKPHOS 81 73  BILITOT 1.4* 1.8*  PROT 7.5 7.4  ALBUMIN 2.6* 2.5*   No results for input(s): LIPASE, AMYLASE in the last 72 hours. Cardiac Enzymes No results for input(s): CKTOTAL, CKMB, CKMBINDEX, TROPONINI in the last 72 hours.  BNP: BNP (last 3 results) Recent Labs  08/17/17 0314 08/23/17 0024 08/30/17 0000  BNP 266.4* 450.5* 114.6*    ProBNP (last 3 results) Recent Labs    11/12/16 1559  PROBNP 388.0*     D-Dimer No results for input(s): DDIMER in the last 72 hours. Hemoglobin A1C No results for input(s): HGBA1C in the last 72 hours. Fasting Lipid Panel No results for input(s): CHOL, HDL, LDLCALC, TRIG, CHOLHDL, LDLDIRECT in the last 72 hours. Thyroid Function Tests No results for input(s): TSH, T4TOTAL, T3FREE, THYROIDAB in the last 72 hours.  Invalid input(s): FREET3  Other results:   Imaging    No results found.   Medications:     Scheduled Medications: . amiodarone  200 mg Oral BID  . aspirin  81 mg Oral Daily  . bisacodyl  10 mg Oral Daily   Or   . bisacodyl  10 mg Rectal Daily  . Chlorhexidine Gluconate Cloth  6 each Topical QHS  . collagenase   Topical Daily  . famotidine  20 mg Oral BID  . insulin aspart  0-24 Units Subcutaneous Q4H  . insulin detemir  30 Units Subcutaneous QHS  . mouth rinse  15 mL Mouth Rinse BID  . potassium chloride  40 mEq Oral Daily  . sodium chloride flush  10-40 mL Intracatheter Q12H  . spironolactone  25 mg Oral Daily  . torsemide  40 mg Oral Daily  . Warfarin - Pharmacist Dosing Inpatient   Does not apply q1800    Infusions: . sodium chloride Stopped (09/03/17 0444)  . lactated ringers Stopped (08/19/17 1602)  . milrinone 0.25 mcg/kg/min (09/05/17 0700)    PRN Medications: acetaminophen (TYLENOL) oral liquid 160 mg/5 mL, clonazepam, hydrALAZINE, lactated ringers, levalbuterol, ondansetron (ZOFRAN) IV, oxyCODONE, RESOURCE THICKENUP CLEAR, sodium chloride flush    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   Directly admitted with persistent low cardiac output for milrinone initiation for home.  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 Acute/Chronic systolic CHF with biventricular failure-> cardiogenic shock: Echo  08/13/2017 EF 20-25%. s/p  Medtronic BiV ICD in place. Cath 12/18 with stable 1v CAD.  s/p HM-3 implant 6/17. Unable to close chest due to high-intrathoracic pressures and RV failure. Taken back to OR 08/17/17 with evacuation of hematoma with improvement. Extubated 6/28. Coox 51% this am on milrinone 0.25 mcg/kg/min. CVP 12-13 on my measure today. Creatinine lower.  - Will continue at current dose of milrinone for now, slow wean.  - Increase torsemide to 40 mg daily.  2. 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 INR 2.25 today. LDH 208.  - VAD interrogated personally. Parameters stable. - Continue warfarin.  -  Continue asa 81 mg daily.  - Ramp echo Monday 3. Acute respiratory failure post-op: Stable on [place].  4. Acute on CKD II-III: Creatinine trending down.  5. [name]/VF: Recurrent VT on 6/22.  S/P VF 6/24 emergent bedside cardioversion. S/P VT 6/26 emergent bedside cardioversion.  - VT quiescent on amiodarone gtt.  - Keep K > 4.0 Mg > 2.0.   6. AFL/atrial fibrillation: S/p previous ablation. He is currently in atrial fibrillation.  - Decrease amiodarone to 200 mg daily, then stop if rate remains controlled.  7. [name]: Recent cath with stable CAD as above: No  chest pain.  8. [name]: Recent A1c 8.3 on 6/3.  - Have adjusted SSI with low sugars. - No change to current plan.   9. Anemia: Post-op.  Got 1 unit PRBCs 6/22 and 6/25. - Hgb stable at 8.7. Follow.  10. Fevers: Afebrile. Cx negative. Has completed meropenem.  11. Deconditioning - Severe. Need to mobilize with PT. CIR following for possible admission this week.  - Nurse to get him out of bed today, try to walk.  12. Dysphagia: Failed repeat swallow study 7/5. Remains dysphagia-2 diet 13. Hypernatremia: In setting of dysphagia 2 diet with thickened liquids so access to free water is reduced.  - Resolved  Transfer to [place] when bed available.   Length of Stay: [place], MD  09/05/2017, 7:54 AM  Advanced Heart Failure Team Pager [phone] (M-F; 7a - 4p)  Please contact [facility] for night-coverage after hours (4p -7a ) and weekends on [email or web address]

## 2017-09-06 LAB — COMPREHENSIVE METABOLIC PANEL
ALBUMIN: 2.3 g/dL — AB (ref 3.5–5.0)
ALT: 27 U/L (ref 0–44)
AST: 26 U/L (ref 15–41)
Alkaline Phosphatase: 68 U/L (ref 38–126)
Anion gap: 7 (ref 5–15)
BILIRUBIN TOTAL: 1.7 mg/dL — AB (ref 0.3–1.2)
BUN: 27 mg/dL — ABNORMAL HIGH (ref 8–23)
CALCIUM: 8.5 mg/dL — AB (ref 8.9–10.3)
CO2: 24 mmol/L (ref 22–32)
CREATININE: 1.25 mg/dL — AB (ref 0.61–1.24)
Chloride: 112 mmol/L — ABNORMAL HIGH (ref 98–111)
GFR calc Af Amer: >60 mL/min (ref >60)
GFR, EST NON AFRICAN AMERICAN: 58 mL/min — AB (ref >60)
Glucose, Bld: 97 mg/dL (ref 70–99)
Potassium: 3.2 mmol/L — ABNORMAL LOW (ref 3.5–5.1)
Sodium: 143 mmol/L (ref 135–145)
TOTAL PROTEIN: 7.5 g/dL (ref 6.5–8.1)

## 2017-09-06 LAB — GLUCOSE, CAPILLARY
GLUCOSE-CAPILLARY: 150 mg/dL — AB (ref 70–99)
GLUCOSE-CAPILLARY: 194 mg/dL — AB (ref 70–99)
GLUCOSE-CAPILLARY: 58 mg/dL — AB (ref 70–99)
Glucose-Capillary: 119 mg/dL — ABNORMAL HIGH (ref 70–99)
Glucose-Capillary: 120 mg/dL — ABNORMAL HIGH (ref 70–99)
Glucose-Capillary: 126 mg/dL — ABNORMAL HIGH (ref 70–99)
Glucose-Capillary: 165 mg/dL — ABNORMAL HIGH (ref 70–99)
Glucose-Capillary: 194 mg/dL — ABNORMAL HIGH (ref 70–99)
Glucose-Capillary: 59 mg/dL — ABNORMAL LOW (ref 70–99)
Glucose-Capillary: 70 mg/dL (ref 70–99)

## 2017-09-06 LAB — PROTIME-INR
INR: 2.06
Prothrombin Time: 23 seconds — ABNORMAL HIGH (ref 11.4–15.2)

## 2017-09-06 LAB — CBC
HEMATOCRIT: 27.3 % — AB (ref 39.0–52.0)
Hemoglobin: 8.3 g/dL — ABNORMAL LOW (ref 13.0–17.0)
MCH: 25.7 pg — ABNORMAL LOW (ref 26.0–34.0)
MCHC: 30.4 g/dL (ref 30.0–36.0)
MCV: 84.5 fL (ref 78.0–100.0)
Platelets: 371 10*3/uL (ref 150–400)
RBC: 3.23 MIL/uL — ABNORMAL LOW (ref 4.22–5.81)
RDW: 18.6 % — AB (ref 11.5–15.5)
WBC: 7.7 10*3/uL (ref 4.0–10.5)

## 2017-09-06 LAB — MAGNESIUM: Magnesium: 1.7 mg/dL (ref 1.7–2.4)

## 2017-09-06 LAB — COOXEMETRY PANEL
Carboxyhemoglobin: 2 % — ABNORMAL HIGH (ref 0.5–1.5)
Methemoglobin: 1.4 % (ref 0.0–1.5)
O2 Saturation: 61.5 %
Total hemoglobin: 8.6 g/dL — ABNORMAL LOW (ref 12.0–16.0)

## 2017-09-06 LAB — BRAIN NATRIURETIC PEPTIDE: B Natriuretic Peptide: 86.1 pg/mL (ref 0.0–100.0)

## 2017-09-06 LAB — LACTATE DEHYDROGENASE: LDH: 205 U/L — ABNORMAL HIGH (ref 98–192)

## 2017-09-06 MED ORDER — POTASSIUM CHLORIDE 20 MEQ PO PACK
40.0000 meq | PACK | Freq: Two times a day (BID) | ORAL | Status: DC
  Administered 2017-09-06 – 2017-09-08 (×6): 40 meq via ORAL
  Filled 2017-09-06 (×7): qty 2

## 2017-09-06 MED ORDER — WARFARIN SODIUM 1 MG PO TABS
1.5000 mg | ORAL_TABLET | Freq: Once | ORAL | Status: AC
  Administered 2017-09-06: 1.5 mg via ORAL
  Filled 2017-09-06: qty 1

## 2017-09-06 MED ORDER — MAGNESIUM SULFATE 2 GM/50ML IV SOLN
2.0000 g | Freq: Once | INTRAVENOUS | Status: AC
  Administered 2017-09-06: 2 g via INTRAVENOUS
  Filled 2017-09-06: qty 50

## 2017-09-06 MED ORDER — NYSTATIN 100000 UNIT/ML MT SUSP
5.0000 mL | Freq: Four times a day (QID) | OROMUCOSAL | Status: DC
  Administered 2017-09-06 – 2017-09-10 (×13): 500000 [IU] via ORAL
  Filled 2017-09-06 (×11): qty 5

## 2017-09-06 NOTE — Progress Notes (Signed)
[place] for coumadin Indication: LVAD  Allergies  Allergen Reactions  . Penicillins Hives, Itching and Other (See Comments)    Has patient had a PCN reaction causing immediate rash, facial/tongue/throat swelling, SOB or lightheadedness with hypotension:# # Yes # # Has patient had a PCN reaction causing severe rash involving mucus membranes or skin necrosis: No Has patient had a PCN reaction that required hospitalization: No Has patient had a PCN reaction occurring within the last 10 years: No If all of the above answers are "NO", then may proceed with Cephalosporin use.     Patient Measurements: Height: 5\' 11"  (180.3 cm) Weight: 222 lb 7.1 oz (100.9 kg) IBW/kg (Calculated) : 75.3 Heparin Dosing Weight: 98.3 kg  Vital Signs: Temp: 98.7 F (37.1 C) (07/07 2322) Temp Source: Oral (07/07 2322) BP: 83/72 (07/08 0423) Pulse Rate: 60 (07/08 0423)  Labs: Recent Labs    09/04/17 0314 09/05/17 0443 09/06/17 0406  HGB 8.7* 8.7* 8.3*  HCT 29.2* 28.8* 27.3*  PLT 465* 429* 371  LABPROT 27.5* 24.7* 23.0*  INR 2.58 2.25 2.06  CREATININE 1.58* 1.29* 1.25*    Estimated Creatinine Clearance: 69.4 mL/min (A) (by C-G formula based on SCr of 1.25 mg/dL (H)).   Medical History: Past Medical History:  Diagnosis Date  . AICD (automatic cardioverter/defibrillator) present 02/05/2014   Upgrade to Medtronic biventricular ICD, serial number  [id number]   . Atrial flutter ([place]) 04/2012   s/p TEE-EPS+RFCA 04/2012  . CAD (coronary artery disease) 4650,3546 X 2    RCA-T, 70% PL (off CFX), 99% Prox LAD/90% Dist LAD, S/P TAXUS stent x 2  . CHF (congestive heart failure) ([place])   . Chronic anticoagulation   . Chronic systolic heart failure ([place])   . CKD (chronic kidney disease)   . Diabetic retinopathy ([place])   . DM type 2 (diabetes mellitus, type 2) ([facility])    insulin dependent  . HTN (hypertension)   . Hypercholesteremia    ablation  . ICD  (implantable cardiac defibrillator) in place   . Ischemic cardiomyopathy March 2015   20-25% 2D   . Nephrolithiasis   . Ventricular tachycardia ([place])     Assessment: 36 yom who underwent LVAD placement on 6/17  INR drifted down slightly to 2.06 from 2.25 yesterday. Hgb 8.3, plt 371, LDH 205 - stable. No s/sx of bleeding. On concurrent amiodarone, which can impact INR sensitivity.  LFTs have trended down to normal. Oral intake improving (100% of meals documented yesterday per charting).   Goal of Therapy:  INR goal 2-2.5 Monitor platelets by anticoagulation protocol: Yes   Plan:   Warfarin 1.5 mg tonight. Monitor s/s bleeding Will provide warfarin education prior to discharge  Doylene Canard, PharmD Clinical Pharmacist  Pager: [phone] Phone: [phone] 09/06/2017 10:11 AM

## 2017-09-06 NOTE — Progress Notes (Signed)
LVAD Coordinator Rounding Note  Admitted 08/11/17 for initiation of Milrinone. 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 He was evaluated [facility] /Dr Posey Pronto on 5/9 for possible heart transplant.   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 Prescott Gum under Destination Therapy criteria. Dr [name] discussed heart transplant candidacy with Dr Posey Pronto. Given size and blood type there was concern he would not make it to transplant.   Pt walked with PT/OT this morning and is much more alert.  Vital signs: Temp:  97.3 HR: 74 afib Auto BP: not done Doppler:  82 O2 Sat:  100 % on RA Wt: 238.....260>239>227>225>229>229>227>222>228>225>222 lbs   LVAD interrogation reveals:  Speed: 5200 Flow: 3.7 Power: 3.6w PI:  3.6 Alarms: none  Events: none Hematocrit:  27 Fixed speed: 5200 Low speed limit: 4900  Drive Line:  Left abd dressing dry and intact; anchors intact and accurately applied. Wife will change the dressing today.  Labs:  LDH trend: 274......324>384>425>423>397>267>306>239>245>236>223>205  INR trend:  1.71.......2.54>2.16>2.63>2.87>2.79>2.67>2.61>2.65>2.58>2.06  Anticoagulation Plan: -INR Goal: 2.0 - 2.5 -ASA Dose: 81 mg daily   Blood Products:  Intra op: - 08/16/17> one platelet; 2 FFP - 08/17/17> ome FFP  Post op: - 08/16/17 > one unit PCs - 08/21/17 > one unit PCs - 08/24/17 > one unit PCs  Device:  - BiV Medtronic - Therapies: off  Respiratory: extubated 08/27/17  Nitric Oxide: off 08/21/17  Gtts: - Epi 1 mcg/min - stopped 08/31/17 - Milrinone 0.125 mcg/kg/min  Adverse Events on VAD: - 08/17/17>mediastinal reexploration with evacuation of mediastinal clot - 08/19/17>sternal closure  VAD education:  1. Caregiver is changing drive line dressing independently.  2. Delivered HM III Patient Handbooks for wife and daughters review. Asked them to complete reading of handbook.  3. We will work to schedule full VAD education possibly later in the  week if the pt continues to be more alert.  Plan/Recommendations:  1. Daily dressing changes per VAD coordinator, nurse champion, or trained caregiver. Wife is to change dressing today. 2. Page VAD Coordinator with any VAD equipment or drive line issues.   Tanda Rockers RN, VAD Coordinator 24/7 VAD Pager: [phone]

## 2017-09-06 NOTE — Progress Notes (Signed)
CSW attempted visit at bedside although patient asleep. CSW updated by VAD Coordinator on patient status. No family present at bedside at the time of visit. Raquel Sarna, [place], [place]

## 2017-09-06 NOTE — Care Management Note (Signed)
Case Management Note  Patient Details  Name: EAIN MULLENDORE MRN: [id number] Date of Birth: 23-Jul-1950  Subjective/Objective:   Pt admitted for HF - will need home milrinone               Action/Plan:  PTA independent from home with wife.  Pt states he has PCP and denied barriers with obtaining/paying for medications.  Pt informed CM that he weights daily and adheres to a low salt diet.  CM gave [facility] choice for infusion - [facility] agreed to by pt and HF team, agency contacted and tentative referral given.  CM will continue to follow   Expected Discharge Date:  08/13/17               Expected Discharge Plan:  [place]  In-House Referral:     Discharge planning Services  CM Consult  Post Acute Care Choice:    Choice offered to:     DME Arranged:    DME Agency:     HH Arranged:    HH Agency:     Status of Service:     If discussed at [employer], dates discussed:    Additional Comments: 09/06/2017  Pt now on progressive unit.  Pt remains on Milrinone.  Discharge plan will be CIR or home as SNF will not admit new LVAD pt.  08/17/17 Pt is now s/p LVAD.  Unfortunately had to return to the OR this am for evacuation of hematoma.  Pt remains on ventilator. CM will continue to follow for discharge needs  08/13/17 Pt will now receive LVAD prior to discharge - will not discharge home on Milrinone Maryclare Labrador, RN 09/06/2017, 9:47 AM

## 2017-09-06 NOTE — Progress Notes (Addendum)
Patient ID: [name] [name], male   DOB: [birth year], 67 y.o.   MRN: [id number]     Advanced Heart Failure Rounding Note  PCP-Cardiologist: No primary care provider on file.   Subjective:    Events 6/17 Underwent HM-3 implant -> Unable to close chest with RV failure and high intrathoracic pressure 6/18 Taken back to OR for evacuation of hematoma. NO RVAD needed. Left sternum open  08/19/17 Taken back to OR for sternal closure 6/22 VT => amiodarone started.  6/24 VT/VF=>urgent cardioversion. 6/25 VT  =>urgent cardioversion  6/27 Extubated  Remains on milrinone 0.25 mcg. CO-OX 61.5%. Yesterday torsemide increased to 40 mg daily.   Denies SOB. Denies pain. Complaining of buttock pain.   LVAD Interrogation HM 3: Speed: 5200 Flow: 4.3 PI: 2.5 Power: 3. No PI events. VAD interrogated personally. Parameters stable.   Objective:   Weight Range: 222 lb 7.1 oz (100.9 kg) Body mass index is 31.02 kg/m.   Vital Signs:   Temp:  [96.4 F (35.8 C)-98.7 F (37.1 C)] 98.7 F (37.1 C) (07/07 2322) Pulse Rate:  [59-77] 60 (07/08 0423) Resp:  [21-26] 22 (07/08 0423) BP: (83-141)/(50-129) 83/72 (07/08 0423) SpO2:  [93 %-100 %] 97 % (07/08 0423) Weight:  [222 lb 7.1 oz (100.9 kg)] 222 lb 7.1 oz (100.9 kg) (07/08 0448) Last BM Date: 09/05/17  Weight change: Filed Weights   09/04/17 0600 09/05/17 0500 09/06/17 0448  Weight: 226 lb 13.7 oz (102.9 kg) 225 lb 15.5 oz (102.5 kg) 222 lb 7.1 oz (100.9 kg)   Intake/Output:   Intake/Output Summary (Last 24 hours) at 09/06/2017 0743 Last data filed at 09/06/2017 0537 Gross per 24 hour  Intake 1086.26 ml  Output 2325 ml  Net -1238.74 ml   Physical Exam  Maps 70s Physical Exam: GENERAL: In the chair. NAD.  HEENT: normal  NECK: Supple, JVP 7-8  .  2+ bilaterally, no bruits.  No lymphadenopathy or thyromegaly appreciated.   CARDIAC:  Mechanical heart sounds with LVAD hum present.  LUNGS:  Clear to auscultation bilaterally.  ABDOMEN:  Soft,  round, nontender, positive bowel sounds x4.     LVAD exit site:  Dressing dry and intact.  No erythema or drainage.  Stabilization device present and accurately applied.  Driveline dressing is being changed daily per sterile technique. EXTREMITIES:  Warm and dry, no cyanosis, clubbing, rash . R and LLE 1+  edema  NEUROLOGIC:  Alert and oriented x 3.   Affect pleasant.    Skin: R buttock 90% eschar L buttock 100% pink   Telemetry   A fib 70s personally reviewed.   Labs    CBC Recent Labs    09/05/17 0443 09/06/17 0406  WBC 8.7 7.7  HGB 8.7* 8.3*  HCT 28.8* 27.3*  MCV 85.7 84.5  PLT 429* 614   Basic Metabolic Panel Recent Labs    09/05/17 0443 09/06/17 0406  NA 141 143  K 3.7 3.2*  CL 114* 112*  CO2 20* 24  GLUCOSE 110* 97  BUN 34* 27*  CREATININE 1.29* 1.25*  CALCIUM 8.9 8.5*  MG 2.1 1.7   Liver Function Tests Recent Labs    09/05/17 0443 09/06/17 0406  AST 27 26  ALT 30 27  ALKPHOS 73 68  BILITOT 1.8* 1.7*  PROT 7.4 7.5  ALBUMIN 2.5* 2.3*   No results for input(s): LIPASE, AMYLASE in the last 72 hours. Cardiac Enzymes No results for input(s): CKTOTAL, CKMB, CKMBINDEX, TROPONINI in the last 72 hours.  BNP: BNP (last 3 results) Recent Labs    08/23/17 0024 08/30/17 0000 09/05/17 2327  BNP 450.5* 114.6* 86.1    ProBNP (last 3 results) Recent Labs    11/12/16 1559  PROBNP 388.0*     D-Dimer No results for input(s): DDIMER in the last 72 hours. Hemoglobin A1C No results for input(s): HGBA1C in the last 72 hours. Fasting Lipid Panel No results for input(s): CHOL, HDL, LDLCALC, TRIG, CHOLHDL, LDLDIRECT in the last 72 hours. Thyroid Function Tests No results for input(s): TSH, T4TOTAL, T3FREE, THYROIDAB in the last 72 hours.  Invalid input(s): FREET3  Other results:   Imaging    No results found.   Medications:     Scheduled Medications: . amiodarone  200 mg Oral Daily  . aspirin  81 mg Oral Daily  . bisacodyl  10 mg Oral  Daily   Or  . bisacodyl  10 mg Rectal Daily  . Chlorhexidine Gluconate Cloth  6 each Topical QHS  . collagenase   Topical Daily  . famotidine  20 mg Oral BID  . insulin aspart  0-24 Units Subcutaneous Q4H  . insulin detemir  30 Units Subcutaneous QHS  . mouth rinse  15 mL Mouth Rinse BID  . potassium chloride  40 mEq Oral Daily  . sodium chloride flush  10-40 mL Intracatheter Q12H  . spironolactone  25 mg Oral Daily  . torsemide  40 mg Oral Daily  . Warfarin - Pharmacist Dosing Inpatient   Does not apply q1800    Infusions: . sodium chloride Stopped (09/03/17 0444)  . lactated ringers Stopped (08/19/17 1602)  . milrinone 0.25 mcg/kg/min (09/06/17 0038)    PRN Medications: acetaminophen (TYLENOL) oral liquid 160 mg/5 mL, clonazepam, hydrALAZINE, lactated ringers, levalbuterol, ondansetron (ZOFRAN) IV, oxyCODONE, RESOURCE THICKENUP CLEAR, sodium chloride flush    Patient Profile  SIR MALLIS is a 67 y.o. 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   Directly admitted with persistent low cardiac output for milrinone initiation for home.  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 Acute/Chronic systolic CHF with biventricular failure-> cardiogenic shock: Echo  08/13/2017 EF 20-25%. s/p  Medtronic BiV ICD in place. Cath 12/18 with stable 1v CAD.  s/p HM-3 implant 6/17. Unable to close chest due to high-intrathoracic pressures and RV failure. Taken back to OR 08/17/17 with evacuation of hematoma with improvement. Extubated 6/28.  - CO-OX 61.5 %. Cut back milrinone 0.125 mcg.  - Continue  torsemide to 40 mg daily.  2. 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 INR 2.25 today. LDH 205.   - VAD interrogated personally. Parameters stable. - Continue warfarin. INR 2.1. Discussed with pharmacy.  - Continue asa 81 mg daily.  -Ramp ECHO today.  3. Acute respiratory  failure post-op: Stable on [place].  4. Acute on CKD II-III: Creatinine trending down.  5. [name]/VF: Recurrent VT on 6/22.  S/P VF 6/24 emergent bedside cardioversion. S/P VT 6/26 emergent bedside cardioversion.  - VT quiescent on amiodarone.  - K low. Supp K  Mag 1.7. Give 2 grams mag.  - Keep K > 4.0 Mg > 2.0.   6. AFL/atrial fibrillation: S/p previous ablation. He is currently in atrial fibrillation.  - Continue  amiodarone to 200 mg daily, then stop if rate remains controlled.  7. CAD s/p PCI of  RCA and LAD: Recent cath with stable CAD as above: No chest pain.  8. [name]: Recent A1c 8.3 on 6/3.  - Have adjusted SSI with low sugars. - No change to current plan.   9. Anemia: Post-op.  Got 1 unit PRBCs 6/22 and 6/25. - Hgb 8.3 .  10. Fevers: Afebrile. Cx negative. Has completed meropenem.  11. Deconditioning - Severe. Need to mobilize with PT.  -CIR following for possible admission this week.  12. Dysphagia: Failed repeat swallow study 7/5. Remains dysphagia-2 diet 13. Hypernatremia: In setting of dysphagia 2 diet with thickened liquids so access to free water is reduced.  -Resolved.  14. Unstageable Pressure Ulcer: R and L buttock.  R buttock with necrosis noted.  Continue santyl for selective debridement. Change daily. Add air overlay. Needs to reposition every hour when in the chair. Keep R /L in bed. WOC has seen.   Length of Stay: [place], NP  09/06/2017, 7:43 AM  Advanced Heart Failure Team Pager [phone] (M-F; 7a - 4p)  Please contact [facility] for night-coverage after hours (4p -7a ) and weekends on [email or web address]  Patient seen with NP, agree with the above note.  He walked today already.  Good diuresis with torsemide yesterday, co-ox 61% today.   Normal LVAD sounds.  JVP 10 cm.  No edema. LVAD parameters reviewed and stable.   Still with some volume overload but weight down and diuresing with torsemide 40 daily, continue.   Can decrease milrinone to 0.125 mcg/kg/min.    Will need ramp echo.   Continue to work on PT/mobilization.    Loralie Champagne 09/06/2017 10:07 AM

## 2017-09-06 NOTE — Progress Notes (Signed)
Occupational Therapy Treatment Patient Details Name: [name] [name] MRN: [id number] DOB: [birth year] Today's Date: 09/06/2017    History of present illness Pt is a 67 y.o. male admitted 08/11/17 for milrinone s/p LVAD HM3 placement 6/18; return to OR same day for hematoma evacuation with delayed closure until 6/31, course complicated by cardiogenic shock, biventricular failure, cardioversions, VDRF. Extubated 6/28. PMhx: chronic systolic CHF due to ICM, [name], CAD, PAD, [name], HTN, HLD, [name]   OT comments  This 67 yo male admitted and underwent above seen today in conjunction with PT. Pt's overall mobility/transfers  improving as well as initiating change/management of wall /battery power today. He will continue to benefit from acute OT with follow up on CIR. See PT note for vitals.  Follow Up Recommendations  CIR;Supervision/Assistance - 24 hour    Equipment Recommendations  Other (comment)(TBD at next venue)    Recommendations for Other Services Rehab consult    Precautions / Restrictions Precautions Precautions: Sternal;Fall Precaution Comments: LVAD Restrictions Weight Bearing Restrictions: Yes       Mobility Bed Mobility Overal bed mobility: Needs Assistance Bed Mobility: Sit to Sidelying         Sit to sidelying: Min assist General bed mobility comments: min assist to bring legs onto surface with cues for sequence for sit to sidelying  Transfers Overall transfer level: Needs assistance   Transfers: Sit to/from Stand Sit to Stand: Mod assist;+2 physical assistance         General transfer comment: cues for hand placement with assist to stand x 2 from chair with increased time to rise. Max assist for scooting to edge with pad and reciprocal scooting    Balance Overall balance assessment: Needs assistance Sitting-balance support: No upper extremity supported Sitting balance-Leahy Scale: Good     Standing balance support: Bilateral  upper extremity supported Standing balance-Leahy Scale: Poor Standing balance comment: reliant on RW, full upright posture today other than cuing him to look up instead of looking down at floor                           ADL either performed or assessed with clinical judgement   ADL Overall ADL's : Needs assistance/impaired                         Toilet Transfer: Minimal assistance;+2 for physical assistance;Ambulation;RW Toilet Transfer Details (indicate cue type and reason): Mod A +2 sit<>stand, min A to ambulate wiht RW Toileting- Clothing Manipulation and Hygiene: Total assistance Toileting - Clothing Manipulation Details (indicate cue type and reason): min A +2 sit<>stand       General ADL Comments: Pt able to transition to/from battery/wall power with Max cues and min A for correct placement of connection ("half moons together) as well as increased time.     Vision Baseline Vision/History: Wears glasses Wears Glasses: Reading only Patient Visual Report: No change from baseline            Cognition Arousal/Alertness: Awake/alert Behavior During Therapy: WFL for tasks assessed/performed Overall Cognitive Status: Impaired/Different from baseline Area of Impairment: Memory;Attention;Following commands                   Current Attention Level: Alternating Memory: Decreased recall of precautions Following Commands: Follows one step commands consistently       General Comments: pt unable to recall all precautions. Aware of date, situation and place  stating he was confused until 7/3                   Pertinent Vitals/ Pain       Pain Assessment: No/denies pain         Frequency  Min 3X/week        Progress Toward Goals  OT Goals(current goals can now be found in the care plan section)  Progress towards OT goals: Progressing toward goals     Plan Discharge plan remains appropriate    Co-evaluation    PT/OT/SLP  Co-Evaluation/Treatment: Yes Reason for Co-Treatment: Complexity of the patient's impairments (multi-system involvement) PT goals addressed during session: Mobility/safety with mobility;Proper use of DME OT goals addressed during session: Strengthening/ROM;ADL's and self-care      AM-PAC PT "6 Clicks" Daily Activity     Outcome Measure   Help from another person eating meals?: None Help from another person taking care of personal grooming?: A Little Help from another person toileting, which includes using toliet, bedpan, or urinal?: A Lot Help from another person bathing (including washing, rinsing, drying)?: A Lot Help from another person to put on and taking off regular upper body clothing?: A Lot Help from another person to put on and taking off regular lower body clothing?: Total 6 Click Score: 14    End of Session Equipment Utilized During Treatment: Gait belt  OT Visit Diagnosis: Unsteadiness on feet (R26.81);Muscle weakness (generalized) (M62.81)   Activity Tolerance Patient tolerated treatment well   Patient Left in bed;with call bell/phone within reach;with family/visitor present   Nurse Communication (would benefit from foley catheter due to not always aware he has to urinate and pressure sores on bottom that need to stay dry)        Time: 2956-2130 OT Time Calculation (min): 31 min  Charges: OT General Charges $OT Visit: 1 Visit OT Treatments $Self Care/Home Management : 8-22 mins [place], OTR/L [phone] 09/06/2017

## 2017-09-06 NOTE — Progress Notes (Signed)
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 Patient now ambulatory with physical therapy and advancing diet Decubitus ulcer being treated by wound care team Surgical incisions healing We will get follow-up chest x-ray tomorrow for diminished breath sounds at left base, rule out pleural effusion  Objective: Vital signs in last 24 hours: Temp:  [96.4 F (35.8 C)-98.7 F (37.1 C)] 97.3 F (36.3 C) (07/08 1215) Pulse Rate:  [59-75] 63 (07/08 1215) Cardiac Rhythm: Atrial fibrillation (07/08 0823) Resp:  [19-25] 19 (07/08 1215) BP: (83-195)/(50-164) 195/164 (07/08 1215) SpO2:  [97 %-100 %] 100 % (07/08 1215) Weight:  [222 lb 7.1 oz (100.9 kg)] 222 lb 7.1 oz (100.9 kg) (07/08 0448)  Hemodynamic parameters for last 24 hours: CVP:  [8 mmHg-17 mmHg] 17 mmHg  Intake/Output from previous day: 07/07 0701 - 07/08 0700 In: 1086.3 [P.O.:926; I.V.:160.3] Out: 2325 [Urine:2325] Intake/Output this shift: No intake/output data recorded.       Exam    General- alert and comfortable    Neck- no JVD, no cervical adenopathy palpable, no carotid bruit   Lungs- clear without rales, wheezes   Cor- regular rate and rhythm, no murmur , gallop   Abdomen- soft, non-tender   Extremities - warm, non-tender, minimal edema   Neuro- oriented, appropriate, no focal weakness Surgical incisions healing.  Pacing wires removed.  Lab Results: Recent Labs    09/05/17 0443 09/06/17 0406  WBC 8.7 7.7  HGB 8.7* 8.3*  HCT 28.8* 27.3*  PLT 429* 371   BMET:  Recent Labs    09/05/17 0443 09/06/17 0406  NA 141 143  K 3.7 3.2*  CL 114* 112*  CO2 20* 24  GLUCOSE 110* 97  BUN 34* 27*  CREATININE 1.29* 1.25*  CALCIUM 8.9 8.5*    PT/INR:  Recent Labs    09/06/17 0406  LABPROT  23.0*  INR 2.06   ABG    Component Value Date/Time   PHART 7.513 (H) 08/27/2017 0545   HCO3 33.3 (H) 08/27/2017 0545   TCO2 25 08/23/2017 0418   ACIDBASEDEF 1.4 08/22/2017 0335   O2SAT 61.5 09/06/2017 0420   CBG (last 3)  Recent Labs    09/06/17 0645 09/06/17 0830 09/06/17 1240  GLUCAP 119* 150* 120*    Assessment/Plan: S/P Procedure(s) (LRB): STERNAL CLOSURE (N/A) TRANSESOPHAGEAL ECHOCARDIOGRAM (TEE) (N/A) Follow-up chest x-ray in a.m. Continue physical therapy-mobilization Would be a good candidate for inpatient rehab-CIR   LOS: 26 days    Tharon Aquas Trigt III 09/06/2017

## 2017-09-06 NOTE — Progress Notes (Signed)
Inpatient Diabetes Program Recommendations  AACE/ADA: New Consensus Statement on Inpatient Glycemic Control (2015)  Target Ranges:  Prepandial:   less than 140 mg/dL      Peak postprandial:   less than 180 mg/dL (1-2 hours)      Critically ill patients:  140 - 180 mg/dL   Lab Results  Component Value Date   GLUCAP 150 (H) 09/06/2017   HGBA1C 8.4 (H) 08/14/2017    Review of Glycemic Control Results for LOVETT, COFFIN (MRN [id number]) as of 09/06/2017 12:58  Ref. Range 09/06/2017 05:21 09/06/2017 05:43 09/06/2017 06:45 09/06/2017 08:30  Glucose-Capillary Latest Ref Range: 70 - 99 mg/dL 59 (L) 70 119 (H) 150 (H)   Diabetes history: Type 2 DM Outpatient Diabetes medications: Trulicity 1.5 mg Q/wk, Metformin 750 mg QAM, NPH (per Dr Jodelle Green note) 30 units BID Current orders for Inpatient glycemic control: Novolog 0-24 units Q4H, Levemir 30 units BID  Inpatient Diabetes Program Recommendations:   Noted hypoglycemic event of 58 mg/dL, consider switching correction to Novolog 0-9 units TID and Novolog 0-5 units QHS now that patient eating.   Thanks, Bronson Curb, MSN, RNC-OB Diabetes Coordinator [phone] (8a-5p)

## 2017-09-06 NOTE — Progress Notes (Signed)
Came to ambulate pt however he is sound asleep. RN asks [place] not to wake him as he was very active this am and now is exhausted. Will f/u tomorrow. Yves Dill CES, ACSM 2:09 PM 09/06/2017

## 2017-09-06 NOTE — Progress Notes (Signed)
  Speech Language Pathology Treatment: Dysphagia  Patient Details Name: TAICHI REPKA MRN: [id number] DOB: 1950/08/20 Today's Date: 09/06/2017 Time: 8811-0315 SLP Time Calculation (min) (ACUTE ONLY): 23 min  Assessment / Plan / Recommendation Clinical Impression  Pt much improved today in terms of alertness and awareness of surroundings, making needs known and following directions more accurately and timely. Recalled chin tuck and performed with min verbal/tactile/visual cues after using reverse Trendelenburg position and pillows behind back in low/air mattress bed to allow for greater ROM during chin tuck. No s/s aspiration. Voice continue to be aphonic, able to achieve increased vocal cord adduction with deeper inhalation and effort; attempted vocalization with head turned to right then left position without significant difference.  Introduced EMT (expiratory muscle strength training) with trainer set at 20 cm H2o with "easy effort" when exhaling into trainer with adequate effrot. Will obtain EMST 150 trainer to provide greater resistance.  Continue honey thick with chin tuck and Dys 2 texture and please position trunk to head of bed using chuck, raise head of bed, set in reverse Trendelenburg position with pillows behind back during meals.    HPI HPI: 67 y.o. 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       SLP Plan  Continue with current plan of care       Recommendations  Diet recommendations: Dysphagia 2 (fine chop);Honey-thick liquid Liquids provided via: Cup Medication Administration: Crushed with puree Supervision: Patient able to self feed;Full supervision/cueing for compensatory strategies Compensations: Slow rate;Small sips/bites;Clear throat intermittently;Chin tuck(tuck with liquids) Postural Changes and/or Swallow Maneuvers: Seated upright  90 degrees                Oral Care Recommendations: Oral care BID Follow up Recommendations: Inpatient Rehab SLP Visit Diagnosis: Dysphagia, oropharyngeal phase (R13.12) Plan: Continue with current plan of care       GO           Swallow Current Status (X4585): 0 percent impaired, limited or restricted    [place] Siren 09/06/2017, 12:16 PM   Orbie Pyo Colvin Caroli.Ed [employer] [phone]

## 2017-09-06 NOTE — Progress Notes (Signed)
Physical Therapy Treatment Patient Details Name: [name] [name] MRN: [id number] DOB: February 15, 1951 Today's Date: 09/06/2017    History of Present Illness Pt is a 67 y.o. male admitted 08/11/17 for milrinone s/p LVAD HM3 placement 6/18; return to OR same day for hematoma evacuation with delayed closure until 2/20, course complicated by cardiogenic shock, biventricular failure, cardioversions, VDRF. Extubated 6/28. PMhx: chronic systolic CHF due to ICM, [name], CAD, PAD, [name], HTN, HLD, [name]    PT Comments    Pt very pleasant, oriented and eager to progress activity today. Pt aware of his confusion post-op and able to participate in power transition today. Pt with max cues to perform power transition to and from battery with only min physical assist to correct placement of connection and provide equipment, increased time with wife present and aware of how to transition. Pt with excellent improvement and should be able to progress gait and independence from this session with CIR highly appropriate.  Speed 5200, flow 3.9, PI 3.7, power 3.6 HR 78-84    Follow Up Recommendations  CIR;Supervision/Assistance - 24 hour     Equipment Recommendations  Rolling walker with 5" wheels;3in1 (PT)    Recommendations for Other Services       Precautions / Restrictions Precautions Precautions: Sternal;Fall Precaution Comments: LVAD Restrictions Weight Bearing Restrictions: Yes    Mobility  Bed Mobility Overal bed mobility: Needs Assistance Bed Mobility: Sit to Sidelying         Sit to sidelying: Min assist General bed mobility comments: min assist to bring legs onto surface with cues for sequence for sit to sidelying  Transfers Overall transfer level: Needs assistance   Transfers: Sit to/from Stand Sit to Stand: Mod assist;+2 physical assistance         General transfer comment: cues for hand placement with assist to stand x 2 from chair with increased time to  rise. Max assist for scooting to edge with pad and reciprocal scooting  Ambulation/Gait Ambulation/Gait assistance: Min assist;+2 safety/equipment Gait Distance (Feet): 40 Feet Assistive device: Rolling walker (2 wheeled) Gait Pattern/deviations: Step-through pattern;Decreased stride length;Trunk flexed   Gait velocity interpretation: <1.8 ft/sec, indicate of risk for recurrent falls General Gait Details: cues for posture, looking up, stepping into Rw and regulating activity. chair to follow with pt walking 40' x 2 with seated rest between   Stairs             Wheelchair Mobility    Modified Rankin (Stroke Patients Only)       Balance Overall balance assessment: Needs assistance   Sitting balance-Leahy Scale: Good       Standing balance-Leahy Scale: Poor                              Cognition Arousal/Alertness: Awake/alert Behavior During Therapy: WFL for tasks assessed/performed Overall Cognitive Status: Impaired/Different from baseline Area of Impairment: Memory                   Current Attention Level: Alternating Memory: Decreased recall of precautions Following Commands: Follows one step commands consistently       General Comments: pt unable to recall all precautions. Aware of date, situation and place stating he was confused until 7/3      Exercises      General Comments        Pertinent Vitals/Pain Pain Assessment: No/denies pain    Home Living  Prior Function            PT Goals (current goals can now be found in the care plan section) Acute Rehab PT Goals Time For Goal Achievement: 09/20/17 Progress towards PT goals: Progressing toward goals;Goals met and updated - see care plan    Frequency           PT Plan Current plan remains appropriate    Co-evaluation PT/OT/SLP Co-Evaluation/Treatment: Yes Reason for Co-Treatment: Complexity of the patient's impairments  (multi-system involvement) PT goals addressed during session: Mobility/safety with mobility;Proper use of DME        AM-PAC PT "6 Clicks" Daily Activity  Outcome Measure  Difficulty turning over in bed (including adjusting bedclothes, sheets and blankets)?: Unable Difficulty moving from lying on back to sitting on the side of the bed? : Unable Difficulty sitting down on and standing up from a chair with arms (e.g., wheelchair, bedside commode, etc,.)?: Unable Help needed moving to and from a bed to chair (including a wheelchair)?: A Lot Help needed walking in hospital room?: A Little Help needed climbing 3-5 steps with a railing? : Total 6 Click Score: 9    End of Session Equipment Utilized During Treatment: Gait belt;Oxygen Activity Tolerance: Patient tolerated treatment well Patient left: in bed;with call bell/phone within reach;with nursing/sitter in room;with family/visitor present Nurse Communication: Mobility status;Precautions PT Visit Diagnosis: Unsteadiness on feet (R26.81);Other abnormalities of gait and mobility (R26.89);Muscle weakness (generalized) (M62.81)     Time: 6226-3335 PT Time Calculation (min) (ACUTE ONLY): 41 min  Charges:  $Gait Training: 8-22 mins $Therapeutic Activity: 8-22 mins                    G Codes:       Elwyn Reach, PT [phone]    [place] 09/06/2017, 11:08 AM

## 2017-09-07 ENCOUNTER — Inpatient Hospital Stay (HOSPITAL_COMMUNITY): Payer: PPO

## 2017-09-07 LAB — GLUCOSE, CAPILLARY
GLUCOSE-CAPILLARY: 147 mg/dL — AB (ref 70–99)
Glucose-Capillary: 48 mg/dL — ABNORMAL LOW (ref 70–99)
Glucose-Capillary: 51 mg/dL — ABNORMAL LOW (ref 70–99)
Glucose-Capillary: 182 mg/dL — ABNORMAL HIGH (ref 70–99)
Glucose-Capillary: 135 mg/dL — ABNORMAL HIGH (ref 70–99)
Glucose-Capillary: 185 mg/dL — ABNORMAL HIGH (ref 70–99)

## 2017-09-07 LAB — COMPREHENSIVE METABOLIC PANEL WITH GFR
ALT: 25 U/L (ref 0–44)
AST: 26 U/L (ref 15–41)
Albumin: 2.5 g/dL — ABNORMAL LOW (ref 3.5–5.0)
Alkaline Phosphatase: 76 U/L (ref 38–126)
Anion gap: 9 (ref 5–15)
BUN: 24 mg/dL — ABNORMAL HIGH (ref 8–23)
CO2: 24 mmol/L (ref 22–32)
Calcium: 8.7 mg/dL — ABNORMAL LOW (ref 8.9–10.3)
Chloride: 109 mmol/L (ref 98–111)
Creatinine, Ser: 1.15 mg/dL (ref 0.61–1.24)
GFR calc Af Amer: >60 mL/min
GFR calc non Af Amer: >60 mL/min
Glucose, Bld: 57 mg/dL — ABNORMAL LOW (ref 70–99)
Potassium: 3.4 mmol/L — ABNORMAL LOW (ref 3.5–5.1)
Sodium: 142 mmol/L (ref 135–145)
Total Bilirubin: 1.5 mg/dL — ABNORMAL HIGH (ref 0.3–1.2)
Total Protein: 7.5 g/dL (ref 6.5–8.1)

## 2017-09-07 LAB — CBC
HCT: 28.6 % — ABNORMAL LOW (ref 39.0–52.0)
Hemoglobin: 8.7 g/dL — ABNORMAL LOW (ref 13.0–17.0)
MCH: 25.9 pg — ABNORMAL LOW (ref 26.0–34.0)
MCHC: 30.4 g/dL (ref 30.0–36.0)
MCV: 85.1 fL (ref 78.0–100.0)
Platelets: 323 K/uL (ref 150–400)
RBC: 3.36 MIL/uL — ABNORMAL LOW (ref 4.22–5.81)
RDW: 18.6 % — ABNORMAL HIGH (ref 11.5–15.5)
WBC: 8 K/uL (ref 4.0–10.5)

## 2017-09-07 LAB — COOXEMETRY PANEL
Carboxyhemoglobin: 2.5 % — ABNORMAL HIGH (ref 0.5–1.5)
Methemoglobin: 0.9 % (ref 0.0–1.5)
O2 Saturation: 54.4 %
Total hemoglobin: 8.9 g/dL — ABNORMAL LOW (ref 12.0–16.0)

## 2017-09-07 LAB — PROTIME-INR
INR: 1.89
Prothrombin Time: 21.6 seconds — ABNORMAL HIGH (ref 11.4–15.2)

## 2017-09-07 LAB — MAGNESIUM: Magnesium: 2 mg/dL (ref 1.7–2.4)

## 2017-09-07 LAB — LACTATE DEHYDROGENASE: LDH: 216 U/L — ABNORMAL HIGH (ref 98–192)

## 2017-09-07 MED ORDER — INSULIN ASPART 100 UNIT/ML ~~LOC~~ SOLN: 0.0000 [IU] | Freq: Every day | SUBCUTANEOUS | Status: DC

## 2017-09-07 MED ORDER — WARFARIN VIDEO
Freq: Once | Status: AC
  Administered 2017-09-07: 18:00:00

## 2017-09-07 MED ORDER — INSULIN DETEMIR 100 UNIT/ML ~~LOC~~ SOLN
25.0000 [IU] | Freq: Every day | SUBCUTANEOUS | Status: DC
  Administered 2017-09-07 – 2017-09-09 (×3): 25 [IU] via SUBCUTANEOUS
  Filled 2017-09-07 (×4): qty 0.25

## 2017-09-07 MED ORDER — GLUCOSE 40 % PO GEL
ORAL | Status: AC
  Administered 2017-09-07: 37.5 g
  Filled 2017-09-07: qty 1

## 2017-09-07 MED ORDER — WARFARIN SODIUM 2.5 MG PO TABS
2.5000 mg | ORAL_TABLET | Freq: Once | ORAL | Status: AC
  Administered 2017-09-07: 2.5 mg via ORAL
  Filled 2017-09-07: qty 1

## 2017-09-07 MED ORDER — COUMADIN BOOK
Freq: Once | Status: AC
  Administered 2017-09-07: 18:00:00
  Filled 2017-09-07: qty 1

## 2017-09-07 MED ORDER — INSULIN ASPART 100 UNIT/ML ~~LOC~~ SOLN
0.0000 [IU] | Freq: Three times a day (TID) | SUBCUTANEOUS | Status: DC
  Administered 2017-09-07: 2 [IU] via SUBCUTANEOUS
  Administered 2017-09-08: 3 [IU] via SUBCUTANEOUS
  Administered 2017-09-08 – 2017-09-10 (×4): 2 [IU] via SUBCUTANEOUS

## 2017-09-07 MED ORDER — POTASSIUM CHLORIDE CRYS ER 20 MEQ PO TBCR
30.0000 meq | EXTENDED_RELEASE_TABLET | Freq: Once | ORAL | Status: AC
  Administered 2017-09-07: 30 meq via ORAL
  Filled 2017-09-07: qty 1

## 2017-09-07 NOTE — Progress Notes (Signed)
LVAD Coordinator Rounding Note  Admitted 08/11/17 for initiation of Milrinone. 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 He was evaluated [facility] /Dr Posey Pronto on 5/9 for possible heart transplant.   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 Prescott Gum under Destination Therapy criteria. Dr [name] discussed heart transplant candidacy with Dr Posey Pronto. Given size and blood type there was concern he would not make it to transplant.   Pt in bed sleeping this morning.  Vital signs: Temp:  97.9 HR: 74 afib Auto BP: 81/69(75) Doppler:  76 O2 Sat:  98 % on RA Wt: 238.....260>239>227>225>229>229>227>222>228>225>222 lbs   LVAD interrogation reveals:  Speed: 5200 Flow: 3.7 Power: 3.6w PI:  4.4 Alarms: none  Events: none Hematocrit:  29 Fixed speed: 5200 Low speed limit: 4900  Drive Line:  Left abd dressing dry and intact; anchors intact and accurately applied.   Labs:  LDH trend: 274......324>384>425>423>397>267>306>239>245>236>223>205>216  INR trend:  1.71.......2.54>2.16>2.63>2.87>2.79>2.67>2.61>2.65>2.58>2.06>1.89  Anticoagulation Plan: -INR Goal: 2.0 - 2.5 -ASA Dose: 81 mg daily   Blood Products:  Intra op: - 08/16/17> one platelet; 2 FFP - 08/17/17> ome FFP  Post op: - 08/16/17 > one unit PCs - 08/21/17 > one unit PCs - 08/24/17 > one unit PCs  Device:  - BiV Medtronic - Therapies: off  Respiratory: extubated 08/27/17  Nitric Oxide: off 08/21/17  Gtts: - Epi 1 mcg/min - stopped 08/31/17 - Milrinone 0.125 mcg/kg/min  Adverse Events on VAD: - 08/17/17>mediastinal reexploration with evacuation of mediastinal clot - 08/19/17>sternal closure  VAD education:  1. Caregiver is changing drive line dressing independently.  2. Delivered HM III Patient Handbooks for wife and daughters review. Asked them to complete reading of handbook.  3. We will work to schedule full VAD education possibly later in the week if the pt continues to be more  alert.  Plan/Recommendations:  1. Daily dressing changes per VAD coordinator, nurse champion, or trained caregiver. Wife is to change dressing today. 2. Page VAD Coordinator with any VAD equipment or drive line issues.   Tanda Rockers RN, VAD Coordinator 24/7 VAD Pager: [phone]

## 2017-09-07 NOTE — Progress Notes (Signed)
[name] [name] for coumadin Indication: LVAD  Allergies  Allergen Reactions  . Penicillins Hives, Itching and Other (See Comments)    Has patient had a PCN reaction causing immediate rash, facial/tongue/throat swelling, SOB or lightheadedness with hypotension:# # Yes # # Has patient had a PCN reaction causing severe rash involving mucus membranes or skin necrosis: No Has patient had a PCN reaction that required hospitalization: No Has patient had a PCN reaction occurring within the last 10 years: No If all of the above answers are "NO", then may proceed with Cephalosporin use.     Patient Measurements: Height: 5\' 11"  (180.3 cm) Weight: 222 lb 10.6 oz (101 kg) IBW/kg (Calculated) : 75.3 Heparin Dosing Weight: 98.3 kg  Vital Signs: Temp: 97.9 F (36.6 C) (07/09 0700) Temp Source: Oral (07/09 0700) BP: 81/69 (07/09 0700) Pulse Rate: 62 (07/09 0700)  Labs: Recent Labs    09/05/17 0443 09/06/17 0406 09/07/17 0500 09/07/17 0742  HGB 8.7* 8.3* 8.7*  --   HCT 28.8* 27.3* 28.6*  --   PLT 429* 371 323  --   LABPROT 24.7* 23.0*  --  21.6*  INR 2.25 2.06  --  1.89  CREATININE 1.29* 1.25* 1.15  --     Estimated Creatinine Clearance: 75.5 mL/min (by C-G formula based on SCr of 1.15 mg/dL).   Medical History: Past Medical History:  Diagnosis Date  . AICD (automatic cardioverter/defibrillator) present 02/05/2014   Upgrade to Medtronic biventricular ICD, serial number  [id number]   . Atrial flutter ([place]) 04/2012   s/p TEE-EPS+RFCA 04/2012  . CAD (coronary artery disease) 2694,8546 X 2    RCA-T, 70% PL (off CFX), 99% Prox LAD/90% Dist LAD, S/P TAXUS stent x 2  . CHF (congestive heart failure) ([place])   . Chronic anticoagulation   . Chronic systolic heart failure ([place])   . CKD (chronic kidney disease)   . Diabetic retinopathy ([place])   . DM type 2 (diabetes mellitus, type 2) ([facility])    insulin dependent  . HTN (hypertension)   .  Hypercholesteremia    ablation  . ICD (implantable cardiac defibrillator) in place   . Ischemic cardiomyopathy March 2015   20-25% 2D   . Nephrolithiasis   . Ventricular tachycardia ([place])     Assessment: 44 yom who underwent LVAD placement on 6/17  INR drifted down again from 2.06 to 1.89, received slightly higher dose of 1.5 yesterday. Hgb 8.7, plt 323, LSH stable at 216. No s/sx of bleeding documented. Meal intake continues to improve (100% of meals documented last). Amiodarone discontinued on 7/9.   Will order increased dose again today given INR subtherapeutic today. If continues to trend lower, will determine if need to bridge.   Goal of Therapy:  INR goal 2-2.5 Monitor platelets by anticoagulation protocol: Yes   Plan:   Warfarin 2.5 mg tonight. Monitor s/s bleeding Will provide warfarin education prior to discharge  Doylene Canard, PharmD Clinical Pharmacist  Pager: [phone] Phone: [phone] 09/07/2017 10:32 AM

## 2017-09-07 NOTE — Progress Notes (Signed)
CARDIAC REHAB PHASE I   PRE:  Rate/Rhythm: 68 afib    BP: sitting 78    SaO2: 97 RA  MODE:  Ambulation: 100 ft   POST:  Rate/Rhythm: 77 afib    BP: sitting 86     SaO2: wouldn't register  Pt asleep in bed. Agreeable to walk but slow to get moving. Able to turn to get to EOB. Able to don batteries with assist pushing cables in. We put batteries in vest for him. Pt needed verbal cues for sternal precautions. Rocked to stand, mod-max assist x2 with gait belt. Fairly steady walking with rollator, slow pace. Sts "I'm so weak". Able to walk 50 ft in hall before sitting to rest. Didn't think he could walk back to room but then he was able to. To BSC with verbal cues to back up. Pt happy with his progress but tired. Asked me to put back to power cord. RN to help clean him up. Will f/u tomorrow. [place], ACSM 09/07/2017 3:44 PM

## 2017-09-07 NOTE — Progress Notes (Signed)
Existing VAD dressing removed and site care performed using sterile technique. Drive line exit site cleaned with Chlora prep applicators x 2, allowed to dry, and gauze dressing with silver strip re-applied. Exit site healing and unincorporated, the velour is fully implanted at exit site. Distal stitch removed today, proximal stitch intact. Small amount of serous drainage. No redness, tenderness, foul odor or rash noted. Drive line anchor re-applied.       Tanda Rockers RN, BSN VAD Coordinator 24/7 Pager [phone]

## 2017-09-07 NOTE — Progress Notes (Signed)
SLP Cancellation Note  Patient Details Name: [name] [name] MRN: [id number] DOB: June 21, 1950   Cancelled treatment:        Pt on potty chair. Will continue efforts   [place] Siren 09/07/2017, 3:56 PM

## 2017-09-07 NOTE — Progress Notes (Signed)
CSW met with patient at bedside. Patient sitting up in bed and eating some lunch. Patient reports he has not had much of an appetite but realizes the need to eat for recovery. Patient's wife called CSW earlier with concerns of patient's lack of interest in eating and concerns of mental health with lengthy hospitalization. Wife sounded tired but continues to be a strong advocate and caregiver for patient. CSW provided supportive intervention to both patient and wife. CSW will continue to follow for supportive needs throughout implant hospitalization. Raquel Sarna, [place], [place]

## 2017-09-07 NOTE — Progress Notes (Signed)
Inpatient Rehabilitation-Admissions Coordinator   [facility] continues to follow pt for potential admit to CIR. Pt remains on Milrinone and will need to either be off Milrinone or on home dosage prior to admit to CIR. Will await medical readiness prior to initiation of insurance authorization. Please call if questions.   Jhonnie Garner, OTR/L  Rehab Admissions Coordinator  [phone] 09/07/2017 11:12 AM

## 2017-09-07 NOTE — Care Management Important Message (Signed)
Important Message  Patient Details  Name: [name] [name] MRN: [id number] Date of Birth: 10-14-1950   Medicare Important Message Given:  Yes    [name] P [place] 09/07/2017, 2:13 PM

## 2017-09-07 NOTE — Progress Notes (Addendum)
Patient ID: [name] [name], male   DOB: June 14, 1950, 67 y.o.   MRN: [id number]     Advanced Heart Failure Rounding Note  PCP-Cardiologist: No primary care provider on file.   Subjective:    Events 6/17 Underwent HM-3 implant -> Unable to close chest with RV failure and high intrathoracic pressure 6/18 Taken back to OR for evacuation of hematoma. NO RVAD needed. Left sternum open  08/19/17 Taken back to OR for sternal closure 6/22 VT => amiodarone started.  6/24 VT/VF=>urgent cardioversion. 6/25 VT  =>urgent cardioversion  6/27 Extubated  Yesterday milrinone cut back to 0.125 mcg. Todays CO-OX is 54%.   Complaining of r buttock pain. Having a hard sitting in the chair. Denies SOB.    LVAD Interrogation HM 3: Speed: 5200 Flow: 4.2 PI: 3 Power: 4 No PI events. VAD interrogated personally. Parameters stable.   Objective:   Weight Range: 222 lb 10.6 oz (101 kg) Body mass index is 31.06 kg/m.   Vital Signs:   Temp:  [97.3 F (36.3 C)-98.1 F (36.7 C)] 97.9 F (36.6 C) (07/09 0430) Pulse Rate:  [62-100] 62 (07/09 0430) Resp:  [10-19] 10 (07/09 0430) BP: (79-109)/(54-88) 79/60 (07/09 0430) SpO2:  [90 %-100 %] 90 % (07/09 0430) Weight:  [222 lb 10.6 oz (101 kg)] 222 lb 10.6 oz (101 kg) (07/09 0500) Last BM Date: 09/06/17  Weight change: Filed Weights   09/05/17 0500 09/06/17 0448 09/07/17 0500  Weight: 225 lb 15.5 oz (102.5 kg) 222 lb 7.1 oz (100.9 kg) 222 lb 10.6 oz (101 kg)   Intake/Output:   Intake/Output Summary (Last 24 hours) at 09/07/2017 0733 Last data filed at 09/07/2017 0558 Gross per 24 hour  Intake 113.67 ml  Output 1250 ml  Net -1136.33 ml   Physical Exam  Maps 70-80s  Physical Exam: GENERAL: NAD. Sitting in the chair. HEENT: normal  NECK: Supple, JVP 11-12.  2+ bilaterally, no bruits.  No lymphadenopathy or thyromegaly appreciated.   CARDIAC:  Mechanical heart sounds with LVAD hum present.  LUNGS:  Clear to auscultation bilaterally.  ABDOMEN:  Soft,  round, nontender, positive bowel sounds x4.     LVAD exit site: Dressing dry and intact.  No erythema or drainage.  Stabilization device present and accurately applied.  Driveline dressing is being changed daily per sterile technique. EXTREMITIES:  Warm and dry, no cyanosis, clubbing, rash. R and LLE 1+  edema . RUE PICC NEUROLOGIC:  Alert and oriented x 3.   No aphasia.  No dysarthria.  Affect pleasant.      Telemetry   Afib 70s personally reviewed.   Labs    CBC Recent Labs    09/06/17 0406 09/07/17 0500  WBC 7.7 8.0  HGB 8.3* 8.7*  HCT 27.3* 28.6*  MCV 84.5 85.1  PLT 371 440   Basic Metabolic Panel Recent Labs    09/06/17 0406 09/07/17 0500  NA 143 142  K 3.2* 3.4*  CL 112* 109  CO2 24 24  GLUCOSE 97 57*  BUN 27* 24*  CREATININE 1.25* 1.15  CALCIUM 8.5* 8.7*  MG 1.7 2.0   Liver Function Tests Recent Labs    09/06/17 0406 09/07/17 0500  AST 26 26  ALT 27 25  ALKPHOS 68 76  BILITOT 1.7* 1.5*  PROT 7.5 7.5  ALBUMIN 2.3* 2.5*   No results for input(s): LIPASE, AMYLASE in the last 72 hours. Cardiac Enzymes No results for input(s): CKTOTAL, CKMB, CKMBINDEX, TROPONINI in the last 72 hours.  BNP:  BNP (last 3 results) Recent Labs    08/23/17 0024 08/30/17 0000 09/05/17 2327  BNP 450.5* 114.6* 86.1    ProBNP (last 3 results) Recent Labs    11/12/16 1559  PROBNP 388.0*     D-Dimer No results for input(s): DDIMER in the last 72 hours. Hemoglobin A1C No results for input(s): HGBA1C in the last 72 hours. Fasting Lipid Panel No results for input(s): CHOL, HDL, LDLCALC, TRIG, CHOLHDL, LDLDIRECT in the last 72 hours. Thyroid Function Tests No results for input(s): TSH, T4TOTAL, T3FREE, THYROIDAB in the last 72 hours.  Invalid input(s): FREET3  Other results:   Imaging    No results found.   Medications:     Scheduled Medications: . amiodarone  200 mg Oral Daily  . aspirin  81 mg Oral Daily  . bisacodyl  10 mg Oral Daily   Or  .  bisacodyl  10 mg Rectal Daily  . Chlorhexidine Gluconate Cloth  6 each Topical QHS  . collagenase   Topical Daily  . famotidine  20 mg Oral BID  . insulin aspart  0-24 Units Subcutaneous Q4H  . insulin detemir  30 Units Subcutaneous QHS  . mouth rinse  15 mL Mouth Rinse BID  . nystatin  5 mL Oral QID  . potassium chloride  40 mEq Oral BID  . sodium chloride flush  10-40 mL Intracatheter Q12H  . spironolactone  25 mg Oral Daily  . torsemide  40 mg Oral Daily  . Warfarin - Pharmacist Dosing Inpatient   Does not apply q1800    Infusions: . sodium chloride 10 mL/hr at 09/06/17 1031  . lactated ringers Stopped (08/19/17 1602)  . milrinone 0.125 mcg/kg/min (09/07/17 0558)    PRN Medications: acetaminophen (TYLENOL) oral liquid 160 mg/5 mL, clonazepam, hydrALAZINE, lactated ringers, levalbuterol, ondansetron (ZOFRAN) IV, oxyCODONE, RESOURCE THICKENUP CLEAR, sodium chloride flush    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   Directly admitted with persistent low cardiac output for milrinone initiation for home.  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 Acute/Chronic systolic CHF with biventricular failure-> cardiogenic shock: Echo  08/13/2017 EF 20-25%. s/p  Medtronic BiV ICD in place. Cath 12/18 with stable 1v CAD.  s/p HM-3 implant 6/17. Unable to close chest due to high-intrathoracic pressures and RV failure. Taken back to OR 08/17/17 with evacuation of hematoma with improvement. Extubated 6/28.  - CO-OX 54%. Continue milrinone 0.125 mcg.  Continue at current dose.  - Continue  torsemide to 40 mg daily.  -Add 12.5 mg spiro daily.  2. 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 INR 2.2 LDH 216.   - VAD interrogated personally. Parameters stable. - Continue warfarin. INR 2.1. Discussed with pharmacy.  - Continue asa 81  mg daily.  -Ramp ECHO once off milrinone.  3. Acute respiratory failure post-op: Stable on [place].  4. Acute on CKD II-III:  Creatinine 1.15   5. [name]/VF: Recurrent VT on 6/22.  S/P VF 6/24 emergent bedside cardioversion. S/P VT 6/26 emergent bedside cardioversion.  - VT quiescent on amiodarone.  - K 3.4 Supp K  -Mag 2.   - Keep K > 4.0 Mg > 2.0.   6. AFL/atrial fibrillation: S/p previous ablation. He is currently in atrial fibrillation.  - Continue  amiodarone to 200 mg daily, then stop if rate remains controlled.  7. [name]: Recent cath with stable CAD as above: No chest pain.  8. [name]: Recent A1c 8.3 on 6/3.  - Have adjusted SSI with low sugars. Hypoglycemic this am.  Adjust SSI per Diabetes Coordinator recommendations.  9. Anemia: Post-op.  Got 1 unit PRBCs 6/22 and 6/25. - Hgb 8.7. Stable.   10. Fevers: Afebrile. Cx negative. Has completed meropenem.  11. Deconditioning - Severe. PT following.  - CIR following for possible admission this week.  12. Dysphagia: Failed repeat swallow study 7/5. Remains dysphagia-2 diet. Speech Therapy following closely.  13. Hypernatremia: In setting of dysphagia 2 diet with thickened liquids so access to free water is reduced.  -Resolved.  14. Unstageable Pressure Ulcer: R and L buttock.  R buttock with necrosis noted. Continue santyl for selective debridement. Change daily.  Continue air overlay. Needs to reposition every hour when in the chair. Keep R /L in bed. WOC has seen. May need a different chair cushion.   Not ready for CIR yet. PT working with him.   Length of Stay: [place], NP  09/07/2017, 7:33 AM  Advanced Heart Failure Team Pager [phone] (M-F; 7a - 4p)  Please contact [facility] for night-coverage after hours (4p -7a ) and weekends on [email or web address]  Patient seen with NP, agree with the above note.  He remains on milrinone 0.125 with co-ox 54%.    Normal LVAD sounds.  JVP 10 cm.  No edema. LVAD  parameters reviewed and stable.   Continue milrinone 0.125 today with co-ox 54%.  On torsemide with gradual diuresis.    Will need ramp echo, will do once he's off milrinone (tomorrow afternoon if we stop it tomorrow morning).   INR therapeutic.   HR controlled, chronic atrial fibrillation.  Will stop amiodarone.   Continue to work on PT/mobilization.  Will need CIR.   Loralie Champagne 09/07/2017 8:40 AM

## 2017-09-07 NOTE — Progress Notes (Signed)
Physical Therapy Treatment Patient Details Name: [name] [name] MRN: [id number] DOB: 1950/08/07 Today's Date: 09/07/2017    History of Present Illness Pt is a 67 y.o. male admitted 08/11/17 for milrinone s/p LVAD HM3 placement 6/18; return to OR same day for hematoma evacuation with delayed closure until 0/17, course complicated by cardiogenic shock, biventricular failure, cardioversions, VDRF. Extubated 6/28. PMhx: chronic systolic CHF due to ICM, [name], CAD, PAD, [name], HTN, HLD, [name]    PT Comments    Pt pleasant, up in chair stating he has been sitting for around 90 minutes, could not get comfortable due to sacrum last night and did not sleep. Pt reports pain and fatigue limiting his ability to participate at this time and declined pain meds. Pt educated for precautions, need to progress activity, limiting time in chair to 2hrs preferably closer to meal times. Pt and wife aware of progression and agreeable to continue to attempt as pt can tolerate. CIR still appropriate.   Speed 5450, flow 3.2, PI 7.7, power 4.4 HR 77 94% on RA    Follow Up Recommendations  CIR;Supervision/Assistance - 24 hour     Equipment Recommendations  Rolling walker with 5" wheels;3in1 (PT)(rollator)    Recommendations for Other Services       Precautions / Restrictions Precautions Precautions: Sternal;Fall Precaution Comments: LVAD, sacral wound Restrictions Weight Bearing Restrictions: Yes    Mobility  Bed Mobility Overal bed mobility: Needs Assistance Bed Mobility: Sit to Sidelying         Sit to sidelying: Min assist General bed mobility comments: min assist to bring legs onto surface with cues for sequence for sit to sidelying with assist to position on side and relieve pressure from sacrum  Transfers Overall transfer level: Needs assistance   Transfers: Sit to/from Stand Sit to Stand: Mod assist;+2 safety/equipment Stand pivot transfers: Mod assist        General transfer comment: mod assist with cues for hand placement and assist for anterior translation to stand from chair x 2 trials. Mod assist with rW to pivot to bed from chair  Ambulation/Gait             General Gait Details: pt declined attempting   Stairs             Wheelchair Mobility    Modified Rankin (Stroke Patients Only)       Balance Overall balance assessment: Needs assistance   Sitting balance-Leahy Scale: Good       Standing balance-Leahy Scale: Poor                              Cognition Arousal/Alertness: Awake/alert Behavior During Therapy: WFL for tasks assessed/performed Overall Cognitive Status: Impaired/Different from baseline Area of Impairment: Memory;Following commands;Safety/judgement                   Current Attention Level: Alternating Memory: Decreased recall of precautions Following Commands: Follows one step commands consistently Safety/Judgement: Decreased awareness of deficits   Problem Solving: Decreased initiation;Difficulty sequencing;Requires verbal cues General Comments: pt able to recall 2 precautions, educated for all, slow to process and reports fatigue and lack of sleep      Exercises      General Comments        Pertinent Vitals/Pain Pain Score: 6  Pain Location: buttocks Pain Descriptors / Indicators: Sore;Grimacing Pain Intervention(s): Limited activity within patient's tolerance;Repositioned;Monitored during session  Home Living                      Prior Function            PT Goals (current goals can now be found in the care plan section) Progress towards PT goals: Progressing toward goals    Frequency           PT Plan Current plan remains appropriate    Co-evaluation              AM-PAC PT "6 Clicks" Daily Activity  Outcome Measure  Difficulty turning over in bed (including adjusting bedclothes, sheets and blankets)?: Unable Difficulty  moving from lying on back to sitting on the side of the bed? : Unable Difficulty sitting down on and standing up from a chair with arms (e.g., wheelchair, bedside commode, etc,.)?: Unable Help needed moving to and from a bed to chair (including a wheelchair)?: A Lot Help needed walking in hospital room?: A Little Help needed climbing 3-5 steps with a railing? : Total 6 Click Score: 9    End of Session Equipment Utilized During Treatment: Gait belt Activity Tolerance: Patient tolerated treatment well Patient left: in bed;with call bell/phone within reach;with family/visitor present Nurse Communication: Mobility status;Precautions PT Visit Diagnosis: Unsteadiness on feet (R26.81);Other abnormalities of gait and mobility (R26.89);Muscle weakness (generalized) (M62.81)     Time: 1031-2811 PT Time Calculation (min) (ACUTE ONLY): 24 min  Charges:  $Therapeutic Activity: 23-37 mins                    G Codes:       Elwyn Reach, PT [phone]    Aksel Bencomo B Jaksen Fiorella 09/07/2017, 10:52 AM

## 2017-09-07 NOTE — Consult Note (Signed)
            [facility] CM Primary Care Navigator  09/07/2017  [name] [name] 1950/08/08 [id number]   Went to see patient at the bedside to identify possible discharge needs but staff are in the room getting patient ready to get up and ambulate at this time.  Will attempt to see patient at another time when he is available in the room.    Addendum (09/09/17):  Went back to seepatientat the bedside toidentify possible discharge needs.  Patientreports"having no energy, getting easily tired and shortness of breath" that resulted to this hospitalization. (Acute/Chronic systolic congestive HF- new LVAD, cardiogenic shock, atrial fibrillation)  Patient [name] Ala, NP with [employer] [facility] care provider.   [facility] [place] [facility].  Patient states that he has beenmanaging hismedications at home with use of "pill box" system filled once a week.  Patient mentioned that he has been driving prior to admission butwife [name] [name]) will be providingtransportationtohisdoctors' appointments after discharge.  Patientlives withwife who will serve as his primary caregiverat home.  [facility].  Patientvoiced understandingto callprimary care provider'soffice whenhereturns backhome,for a post discharge follow-upvisitwithin1- 2 weeksor sooner if needs arise.Patient letter (with PCP's contact number) was provided asareminder.   Explained topatientregarding [facility] CM services available for health management/ resourcesat home but he states that he is capable and knowledgeable in managing his health issues (diabetes/ HF), as directed and followed by providers. Both wife and daughter [name] [name]') have been assisting him in managing his health conditions at home as stated. He is aware of diet  restrictions, medication adherence, exercise and close follow-up with providers and dietitian when needed. Patient is actively followed by [facility] as well.  He plans to discuss with primary care provider on his next visit about needing further help in managing his health issues aside from the services that he is currently on . Patient verbalizedunderstandingof need to seekreferral from primary care provider to [facility] care management ifdeemed necessary and appropriatefor furtherservicesin the future-oncehe isdischarge backhome.   [facility] care management information was provided for futureneeds thatpatient may have.  Primary care provider's office is listed as providing transition of care (TOC) follow-up.   For additional questions please contact:  Edwena Felty A. Clothilde Tippetts, BSN, RN-BC [facility] PRIMARY CARE Navigator Cell: [phone]

## 2017-09-07 NOTE — Progress Notes (Signed)
Nutrition Follow-up  DOCUMENTATION CODES:   Obesity unspecified  INTERVENTION:   -Continue Magic Cup TID with meals, each supplement provides 290 kcals and 9 grams protein -Continue Snacks BID -MVI with minerals daily  NUTRITION DIAGNOSIS:   Increased nutrient needs related to wound healing, post-op healing as evidenced by estimated needs.  Ongoing  GOAL:   Patient will meet greater than or equal to 90% of their needs  Progressing  MONITOR:   PO intake, Supplement acceptance, Diet advancement, Labs, Weight trends, I & O's  REASON FOR ASSESSMENT:   Consult LVAD Eval  ASSESSMENT:   67 yo male admitted with acute on chronic CHF, pt being worked up for destination LVAD. Pt with hx of CHF due to ICM, CAD s/p PCI, PAD, DM, HTN, HL, CKD II/III  6/12 Admitted 6/17 LVAD, Intubated 6/18 Mediastinal re-exploration with evacuation of hematoma 6/20 Evacuation of R-sided pericardia hematoma 6/21 TF initiated 6/27 Extubated 6/29 MBS yesterday, Dysphagia I, Honey thick diet 7/2 diet increased to Dysphagia 2, Honey thick diet  Reviewed I/O's: -1.1 L x 24 hours and -14.3 L since 08/24/17  Case discussed with RN, who reports pt is making good progress; no difficulty with foods, liquids, or medications. Observed pt take medications crushed in applesauce without difficulty.  Spoke with pt at bedside, who reports his appetite "comes and goes", but swallow function continues to improve. Per meal completion records, intake has improved over the past several days. Noted meal completion 25-100%. Pt really enjoys Magic Cup supplements (especially orange cream flavor) and has been consuming them. Noted Magic Cup unopened on tray table.   Discussed importance of continued good meal and supplement intake to promote healing.   Per chart review, plan to d/c to CIR vs SNF once medically stable.   Labs reviewed: K: 3.4 (on PO supplementation), CBGS: 48-182(inpatient orders for glycemic control  are 0-15 units insulin aspart TID with meals, 0-5 units insulin aspart q HS, and 25 units insulin detemir q HS).  Diet Order:   Diet Order           DIET DYS 2 Room service appropriate? Yes; Fluid consistency: Honey Thick  Diet effective now          EDUCATION NEEDS:   Education needs have been addressed  Skin:  Skin Assessment: Skin Integrity Issues: Skin Integrity Issues:: DTI, Unstageable, Incisions DTI: lt buttocks Unstageable: rt buttocks Incisions: closed chest, abdomen Other: n/a  Last BM:  09/06/17  Height:   Ht Readings from Last 1 Encounters:  09/03/17 5\' 11"  (1.803 m)    Weight:   Wt Readings from Last 1 Encounters:  09/07/17 222 lb 10.6 oz (101 kg)    Ideal Body Weight:  78.1 kg  BMI:  Body mass index is 31.06 kg/m.  Estimated Nutritional Needs:   Kcal:  2000-2280  Protein:  100-120 g  Fluid:  >/= 1.8 L    Jayd Forrey A. Jimmye Norman, RD, LDN, CDE Pager: [phone] After hours Pager: [phone]

## 2017-09-08 LAB — COMPREHENSIVE METABOLIC PANEL
ALK PHOS: 74 U/L (ref 38–126)
ALT: 26 U/L (ref 0–44)
AST: 26 U/L (ref 15–41)
Albumin: 2.4 g/dL — ABNORMAL LOW (ref 3.5–5.0)
Anion gap: 8 (ref 5–15)
BILIRUBIN TOTAL: 1.7 mg/dL — AB (ref 0.3–1.2)
BUN: 21 mg/dL (ref 8–23)
CALCIUM: 8.6 mg/dL — AB (ref 8.9–10.3)
CHLORIDE: 108 mmol/L (ref 98–111)
CO2: 23 mmol/L (ref 22–32)
CREATININE: 1.18 mg/dL (ref 0.61–1.24)
GFR calc Af Amer: >60 mL/min (ref >60)
Glucose, Bld: 127 mg/dL — ABNORMAL HIGH (ref 70–99)
Potassium: 3.8 mmol/L (ref 3.5–5.1)
Sodium: 139 mmol/L (ref 135–145)
TOTAL PROTEIN: 7.6 g/dL (ref 6.5–8.1)

## 2017-09-08 LAB — CBC
HEMATOCRIT: 29.2 % — AB (ref 39.0–52.0)
Hemoglobin: 9 g/dL — ABNORMAL LOW (ref 13.0–17.0)
MCH: 26 pg (ref 26.0–34.0)
MCHC: 30.8 g/dL (ref 30.0–36.0)
MCV: 84.4 fL (ref 78.0–100.0)
PLATELETS: 285 10*3/uL (ref 150–400)
RBC: 3.46 MIL/uL — AB (ref 4.22–5.81)
RDW: 18.6 % — ABNORMAL HIGH (ref 11.5–15.5)
WBC: 6.4 10*3/uL (ref 4.0–10.5)

## 2017-09-08 LAB — GLUCOSE, CAPILLARY
GLUCOSE-CAPILLARY: 131 mg/dL — AB (ref 70–99)
GLUCOSE-CAPILLARY: 108 mg/dL — AB (ref 70–99)
GLUCOSE-CAPILLARY: 158 mg/dL — AB (ref 70–99)
Glucose-Capillary: 147 mg/dL — ABNORMAL HIGH (ref 70–99)
Glucose-Capillary: 143 mg/dL — ABNORMAL HIGH (ref 70–99)
Glucose-Capillary: 161 mg/dL — ABNORMAL HIGH (ref 70–99)

## 2017-09-08 LAB — COOXEMETRY PANEL
Carboxyhemoglobin: 2.2 % — ABNORMAL HIGH (ref 0.5–1.5)
Methemoglobin: 1.5 % (ref 0.0–1.5)
O2 Saturation: 60.9 %
Total hemoglobin: 7.5 g/dL — ABNORMAL LOW (ref 12.0–16.0)

## 2017-09-08 LAB — MAGNESIUM: Magnesium: 1.8 mg/dL (ref 1.7–2.4)

## 2017-09-08 LAB — LACTATE DEHYDROGENASE: LDH: 205 U/L — ABNORMAL HIGH (ref 98–192)

## 2017-09-08 LAB — PROTIME-INR
INR: 1.96
Prothrombin Time: 22.2 seconds — ABNORMAL HIGH (ref 11.4–15.2)

## 2017-09-08 MED ORDER — ENSURE ENLIVE PO LIQD
237.0000 mL | Freq: Two times a day (BID) | ORAL | Status: DC
  Administered 2017-09-09: 237 mL via ORAL

## 2017-09-08 MED ORDER — MAGNESIUM OXIDE 400 (241.3 MG) MG PO TABS
400.0000 mg | ORAL_TABLET | Freq: Two times a day (BID) | ORAL | Status: DC
  Administered 2017-09-08 – 2017-09-10 (×5): 400 mg via ORAL
  Filled 2017-09-08 (×5): qty 1

## 2017-09-08 MED ORDER — TORSEMIDE 20 MG PO TABS
60.0000 mg | ORAL_TABLET | Freq: Every day | ORAL | Status: DC
  Filled 2017-09-08: qty 3

## 2017-09-08 MED ORDER — POTASSIUM CHLORIDE CRYS ER 20 MEQ PO TBCR
30.0000 meq | EXTENDED_RELEASE_TABLET | Freq: Once | ORAL | Status: AC
  Administered 2017-09-08: 30 meq via ORAL
  Filled 2017-09-08: qty 1

## 2017-09-08 MED ORDER — WARFARIN SODIUM 2.5 MG PO TABS
2.5000 mg | ORAL_TABLET | Freq: Once | ORAL | Status: AC
  Administered 2017-09-08: 2.5 mg via ORAL
  Filled 2017-09-08: qty 1

## 2017-09-08 MED ORDER — WARFARIN SODIUM 2 MG PO TABS: 2.0000 mg | ORAL_TABLET | Freq: Once | ORAL | Status: DC

## 2017-09-08 MED ORDER — FUROSEMIDE 10 MG/ML IJ SOLN
60.0000 mg | Freq: Two times a day (BID) | INTRAMUSCULAR | Status: DC
  Administered 2017-09-08 (×2): 60 mg via INTRAVENOUS
  Filled 2017-09-08 (×3): qty 6

## 2017-09-08 MED ORDER — POTASSIUM CHLORIDE CRYS ER 20 MEQ PO TBCR
20.0000 meq | EXTENDED_RELEASE_TABLET | Freq: Once | ORAL | Status: DC
Start: 2017-09-08 — End: 2017-09-08

## 2017-09-08 NOTE — Consult Note (Signed)
[name] Nurse wound follow up Wound type: DTI  Bilateral buttock, follow up on how Santyl is working and if ready for Hydrotherapy Measurement:left gluteal fold 2.5cm x 3cm with white tightly adhered slough. Right gluteal fold and buttock 8.5cm x 6cm by unknown depth, cover with white tightly adhered slough except in center is a 1.5cm circle of black slough. 90% white slough, 10% black slough Wound bed:see above Drainage (amount, consistency, odor) none Periwound: intact Dressing procedure/placement/frequency: I removed sacral foam to assess how [name] [name] is working. Wound has not improved. No gauze was on top of wound. Santyl is only activated to begin the enzymatic process once moistened with NS. In addition a foam dressing was used which absorbs the [name]. Specific orders written and discussed with bedside RN that Santyl [name] NS gauze on top of it to activate and [name] dry gauze to keep covered, not foam to absorb Santyl. WOC will follow weekly, if need assistance prior to our next visit please re-consult.  Fara Olden, RN-C, WTA-C, [place] Wound Treatment Associate Ostomy Care Associate

## 2017-09-08 NOTE — Progress Notes (Signed)
[place] for coumadin Indication: LVAD  Allergies  Allergen Reactions  . Penicillins Hives, Itching and Other (See Comments)    Has patient had a PCN reaction causing immediate rash, facial/tongue/throat swelling, SOB or lightheadedness with hypotension:# # Yes # # Has patient had a PCN reaction causing severe rash involving mucus membranes or skin necrosis: No Has patient had a PCN reaction that required hospitalization: No Has patient had a PCN reaction occurring within the last 10 years: No If all of the above answers are "NO", then may proceed with Cephalosporin use.     Patient Measurements: Height: 5\' 11"  (180.3 cm) Weight: 221 lb 9 oz (100.5 kg) IBW/kg (Calculated) : 75.3 Heparin Dosing Weight: 98.3 kg  Vital Signs: Temp: 98.1 F (36.7 C) (07/10 0314) Temp Source: Oral (07/10 0314) BP: 84/74 (07/10 0440) Pulse Rate: 74 (07/10 0440)  Labs: Recent Labs    09/06/17 0406 09/07/17 0500 09/07/17 0742 09/08/17 0449  HGB 8.3* 8.7*  --  9.0*  HCT 27.3* 28.6*  --  29.2*  PLT 371 323  --  285  LABPROT 23.0*  --  21.6* 22.2*  INR 2.06  --  1.89 1.96  CREATININE 1.25* 1.15  --  1.18    Estimated Creatinine Clearance: 73.4 mL/min (by C-G formula based on SCr of 1.18 mg/dL).   Medical History: Past Medical History:  Diagnosis Date  . AICD (automatic cardioverter/defibrillator) present 02/05/2014   Upgrade to Medtronic biventricular ICD, serial number  BLD [id number]   . Atrial flutter ([place]) 04/2012   s/p TEE-EPS+RFCA 04/2012  . CAD (coronary artery disease) 4081,4481 X 2    RCA-T, 70% PL (off CFX), 99% Prox LAD/90% Dist LAD, S/P TAXUS stent x 2  . CHF (congestive heart failure) ([place])   . Chronic anticoagulation   . Chronic systolic heart failure ([place])   . CKD (chronic kidney disease)   . Diabetic retinopathy ([place])   . DM type 2 (diabetes mellitus, type 2) (HCC)    insulin dependent  . HTN (hypertension)   . Hypercholesteremia     ablation  . ICD (implantable cardiac defibrillator) in place   . Ischemic cardiomyopathy March 2015   20-25% 2D   . Nephrolithiasis   . Ventricular tachycardia ([place])     Assessment: 55 yom who underwent LVAD placement on 6/17  INR increased from 1.89 to 1.96. Hgb 9, plt 285, LDH 205- stable. No s/sx of bleeding documented. Meal intake continues to improve (50% of meals documented last). Amiodarone discontinued on 7/9.   Goal of Therapy:  INR goal 2-2.5 Monitor platelets by anticoagulation protocol: Yes   Plan:   Warfarin 2.5 mg again tonight. Monitor s/s bleeding Will provide warfarin education prior to discharge  Doylene Canard, PharmD Clinical Pharmacist  Pager: [phone] Phone: [phone] 09/08/2017 8:33 AM

## 2017-09-08 NOTE — Progress Notes (Addendum)
Inpatient Rehabilitation-Admissions Coordinator   [facility] has begun insurance authorization process for possible admit to CIR. Please call if questions.   Jhonnie Garner, OTR/L  Rehab Admissions Coordinator  [phone] 09/08/2017 4:12 PM

## 2017-09-08 NOTE — Progress Notes (Signed)
CSW met at bedside with wife and patient. Patient states he is feeling improved today. Wife reported he had a good night and they both were able to get some sleep. Wife appears in good spirits today and noted patient may go to CIR within the next day or so. CSW provided supportive intervention and will continue to follow throughout implant hospitalization. Raquel Sarna, [place], [place]

## 2017-09-08 NOTE — Progress Notes (Signed)
LVAD Coordinator Rounding Note  Admitted 08/11/17 for initiation of Milrinone. 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 He was evaluated [facility] /Dr Posey Pronto on 5/9 for possible heart transplant.   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 Prescott Gum under Destination Therapy criteria. Dr [name] discussed heart transplant candidacy with Dr Posey Pronto. Given size and blood type there was concern he would not make it to transplant.   Pt sitting in chair, eating lunch. His voice is still very weak, but says he's getting a "little stronger" every day.   Vital signs: Temp:  97.9 HR: 64 afib Auto BP:  88/70 (78) Doppler:  78 O2 Sat:  98 % on RA Wt: 238.....260>239>227>225>229>229>227>222>228>225>222>221 lbs   LVAD interrogation reveals:  Speed: 5200 Flow: 3.6 Power: 3.5w PI:  5.0 Alarms: none  Events: none Hematocrit:  29 Fixed speed: 5200 Low speed limit: 4900  Drive Line:  Left abd dressing dry and intact; anchors intact and accurately applied. Existing VAD dressing removed and site care performed using sterile technique. Drive line exit site cleaned with Chlora prep applicators x 2, allowed to dry, and gauze dressing with silver strip re-applied. Exit unincorporated, the velour is fully implanted at exit site. Small amount light brown drainage, no tenderness, foul odor or rash noted. Drive line anchor re-applied.   CT sutures removed.   Labs:  LDH trend: 274......324>384>425>423>397>267>306>239>245>236>223>205>216>205  INR trend:  1.71.......2.54>2.16>2.63>2.87>2.79>2.67>2.61>2.65>2.58>2.06>1.89>1.96  Anticoagulation Plan: -INR Goal: 2.0 - 2.5 -ASA Dose: 81 mg daily   Blood Products:  Intra op: - 08/16/17> one platelet; 2 FFP - 08/17/17> ome FFP  Post op: - 08/16/17 > one unit PCs - 08/21/17 > one unit PCs - 08/24/17 > one unit PCs  Device:  - BiV Medtronic - Therapies: off  Respiratory: extubated 08/27/17  Nitric Oxide: off 08/21/17  Gtts: - Epi  1 mcg/min - stopped 08/31/17 - Milrinone 0.125 mcg/kg/min - off 09/08/17  Adverse Events on VAD: - 08/17/17>mediastinal reexploration with evacuation of mediastinal clot - 08/19/17>sternal closure  VAD education:  1. Caregiver is changing drive line dressing independently.  2. Delivered HM III Patient Handbooks for wife and daughters review. Asked them to complete reading of handbook.  3.VAD discharge education with patient and family scheduled for tomorrow, 09/09/17 at 2 pm.  Plan/Recommendations:  1. Daily dressing changes per VAD coordinator, nurse champion, or trained caregiver. 2. Page VAD Coordinator with any VAD equipment or drive line issues.   Zada Girt RN, VAD Coordinator 24/7 VAD Pager: [phone]

## 2017-09-08 NOTE — Progress Notes (Signed)
20 Days Post-Op Procedure(s) (LRB): STERNAL CLOSURE (N/A) TRANSESOPHAGEAL ECHOCARDIOGRAM (TEE) (N/A) Subjective: Patient recovering 3 weeks after implantation  HeartMate 3 Overall strength is improving Chest x-ray today remains wet, continuing IV Lasix Weaning milrinone with maintenance of adequate mixed venous saturation Surgical incision is healing Surgical specimen of LV apex shows no evidence of amyloid Objective: Vital signs in last 24 hours: Temp:  [96.7 F (35.9 C)-98.1 F (36.7 C)] 98.1 F (36.7 C) (07/10 0314) Pulse Rate:  [58-113] 74 (07/10 0440) Cardiac Rhythm: Ventricular paced;Atrial fibrillation;Atrial flutter (07/10 0700) Resp:  [18-34] 21 (07/10 0440) BP: (80-100)/(55-82) 88/70 (07/10 1000) SpO2:  [83 %-98 %] 98 % (07/10 0440) Weight:  [221 lb 9 oz (100.5 kg)] 221 lb 9 oz (100.5 kg) (07/10 0440)  Hemodynamic parameters for last 24 hours: CVP:  [16 mmHg-18 mmHg] 18 mmHg  Intake/Output from previous day: 07/09 0701 - 07/10 0700 In: 664.9 [P.O.:600; I.V.:64.9] Out: 950 [Urine:950] Intake/Output this shift: Total I/O In: 33.4 [I.V.:33.4] Out: 225 [Urine:225]  Normal VAD home Lungs clear Minimal edema  Lab Results: Recent Labs    09/07/17 0500 09/08/17 0449  WBC 8.0 6.4  HGB 8.7* 9.0*  HCT 28.6* 29.2*  PLT 323 285   BMET:  Recent Labs    09/07/17 0500 09/08/17 0449  NA 142 139  K 3.4* 3.8  CL 109 108  CO2 24 23  GLUCOSE 57* 127*  BUN 24* 21  CREATININE 1.15 1.18  CALCIUM 8.7* 8.6*    PT/INR:  Recent Labs    09/08/17 0449  LABPROT 22.2*  INR 1.96   ABG    Component Value Date/Time   PHART 7.513 (H) 08/27/2017 0545   HCO3 33.3 (H) 08/27/2017 0545   TCO2 25 08/23/2017 0418   ACIDBASEDEF 1.4 08/22/2017 0335   O2SAT 60.9 09/08/2017 0525   CBG (last 3)  Recent Labs    09/07/17 2358 09/08/17 0440 09/08/17 0808  GLUCAP 147* 131* 108*    Assessment/Plan: S/P Procedure(s) (LRB): STERNAL CLOSURE (N/A) TRANSESOPHAGEAL  ECHOCARDIOGRAM (TEE) (N/A) Continue current care Patient would benefit from CIR   LOS: 28 days    Tharon Aquas Trigt III 09/08/2017

## 2017-09-08 NOTE — Progress Notes (Signed)
CARDIAC REHAB PHASE I   PRE:  Rate/Rhythm: 66 afib    BP: sitting 90 MAP    SaO2: would not register on 1L  MODE:  Ambulation: 110 ft   POST:  Rate/Rhythm: 77 afib with paced beats    BP: sitting 96 MAP    SaO2: wouldn't register on 2L  Pt to [facility] for large BM. Able to don batteries with assist for lining up moons and strength to push together. Standing stronger today, less assist. Used gait belt, assist x2 to walk (one person for recliner). Pt tends to use his arms to move hips around and lower himself. Discussed this, he continues to seem anxious with lower himself. Walked 60 ft then sat for rest, then another 50 ft. To recliner, ready for lunch. Pt happy with his progress.  [place], ACSM 09/08/2017 2:07 PM

## 2017-09-08 NOTE — Progress Notes (Addendum)
Patient ID: [name] [name], male   DOB: [birth year], 67 y.o.   MRN: [id number]     Advanced Heart Failure Rounding Note  PCP-Cardiologist: No primary care provider on file.   Subjective:    Events 6/17 Underwent HM-3 implant -> Unable to close chest with RV failure and high intrathoracic pressure 6/18 Taken back to OR for evacuation of hematoma. NO RVAD needed. Left sternum open  08/19/17 Taken back to OR for sternal closure 6/22 VT => amiodarone started.  6/24 VT/VF=>urgent cardioversion. 6/25 VT  =>urgent cardioversion  6/27 Extubated  CO-OX 61% on milrinone 0.125 mcg.   Complaining of ongoing buttock pain. Denies SOB. Hungry.   LVAD Interrogation HM 3: Speed: 5200 Flow: 4 PI: 3.2. Power: 4 No PI events. VAD interrogated personally. Parameters stable.   Objective:   Weight Range: 221 lb 9 oz (100.5 kg) Body mass index is 30.9 kg/m.   Vital Signs:   Temp:  [96.7 F (35.9 C)-98.1 F (36.7 C)] 98.1 F (36.7 C) (07/10 0314) Pulse Rate:  [58-113] 74 (07/10 0440) Resp:  [18-34] 21 (07/10 0440) BP: (80-100)/(55-82) 84/74 (07/10 0440) SpO2:  [83 %-98 %] 98 % (07/10 0440) Weight:  [221 lb 9 oz (100.5 kg)] 221 lb 9 oz (100.5 kg) (07/10 0440) Last BM Date: 09/06/17  Weight change: Filed Weights   09/06/17 0448 09/07/17 0500 09/08/17 0440  Weight: 222 lb 7.1 oz (100.9 kg) 222 lb 10.6 oz (101 kg) 221 lb 9 oz (100.5 kg)   Intake/Output:   Intake/Output Summary (Last 24 hours) at 09/08/2017 0831 Last data filed at 09/08/2017 0800 Gross per 24 hour  Intake 698.28 ml  Output 1175 ml  Net -476.72 ml   Physical Exam  Maps 70-80s  Physical Exam: GENERAL: In bed.  HEENT: normal  NECK: Supple, JVP 11-12 .  2+ bilaterally, no bruits.  No lymphadenopathy or thyromegaly appreciated.   CARDIAC:  Mechanical heart sounds with LVAD hum present.  LUNGS:  Clear to auscultation bilaterally.  ABDOMEN:  Soft, round, nontender, positive bowel sounds x4.     LVAD exit site: Dressing dry  and intact.  No erythema or drainage.  Stabilization device present and accurately applied.  Driveline dressing is being changed daily per sterile technique. EXTREMITIES:  Warm and dry, no cyanosis, clubbing, rash . R/L foot 1+ edema. RUE PICC  NEUROLOGIC:  Alert and oriented x 4.  Gait steady.  No aphasia.  No dysarthria.  Affect pleasant.     Telemetry   A Fib 70s   Labs    CBC Recent Labs    09/07/17 0500 09/08/17 0449  WBC 8.0 6.4  HGB 8.7* 9.0*  HCT 28.6* 29.2*  MCV 85.1 84.4  PLT 323 229   Basic Metabolic Panel Recent Labs    09/07/17 0500 09/08/17 0449  NA 142 139  K 3.4* 3.8  CL 109 108  CO2 24 23  GLUCOSE 57* 127*  BUN 24* 21  CREATININE 1.15 1.18  CALCIUM 8.7* 8.6*  MG 2.0 1.8   Liver Function Tests Recent Labs    09/07/17 0500 09/08/17 0449  AST 26 26  ALT 25 26  ALKPHOS 76 74  BILITOT 1.5* 1.7*  PROT 7.5 7.6  ALBUMIN 2.5* 2.4*   No results for input(s): LIPASE, AMYLASE in the last 72 hours. Cardiac Enzymes No results for input(s): CKTOTAL, CKMB, CKMBINDEX, TROPONINI in the last 72 hours.  BNP: BNP (last 3 results) Recent Labs    08/23/17 0024 08/30/17 0000 09/05/17  2327  BNP 450.5* 114.6* 86.1    ProBNP (last 3 results) Recent Labs    11/12/16 1559  PROBNP 388.0*     D-Dimer No results for input(s): DDIMER in the last 72 hours. Hemoglobin A1C No results for input(s): HGBA1C in the last 72 hours. Fasting Lipid Panel No results for input(s): CHOL, HDL, LDLCALC, TRIG, CHOLHDL, LDLDIRECT in the last 72 hours. Thyroid Function Tests No results for input(s): TSH, T4TOTAL, T3FREE, THYROIDAB in the last 72 hours.  Invalid input(s): FREET3  Other results:   Imaging    Dg Chest 2 View  Result Date: 09/07/2017 CLINICAL DATA:  Left ventricular assist device. EXAM: CHEST - 2 VIEW COMPARISON:  Radiographs of August 31, 2017. FINDINGS: Stable cardiomegaly. Left ventricular assist device is in good position. Left-sided pacemaker is  unchanged in position. Right-sided PICC line is again noted with tip in expected position of right atrium. No pneumothorax is noted. Stable stable bilateral lung opacities are noted concerning for edema or possibly pneumonia. Bony thorax is unremarkable. IMPRESSION: Stable position of left ventricular assist device. Stable bilateral lung opacities as described above. Electronically Signed   By: Marijo Conception, M.D.   On: 09/07/2017 11:21     Medications:     Scheduled Medications: . aspirin  81 mg Oral Daily  . bisacodyl  10 mg Oral Daily   Or  . bisacodyl  10 mg Rectal Daily  . Chlorhexidine Gluconate Cloth  6 each Topical QHS  . collagenase   Topical Daily  . famotidine  20 mg Oral BID  . insulin aspart  0-15 Units Subcutaneous TID WC  . insulin aspart  0-5 Units Subcutaneous QHS  . insulin detemir  25 Units Subcutaneous QHS  . mouth rinse  15 mL Mouth Rinse BID  . nystatin  5 mL Oral QID  . potassium chloride  40 mEq Oral BID  . sodium chloride flush  10-40 mL Intracatheter Q12H  . spironolactone  25 mg Oral Daily  . torsemide  40 mg Oral Daily  . Warfarin - Pharmacist Dosing Inpatient   Does not apply q1800    Infusions: . sodium chloride 10 mL/hr at 09/06/17 1031  . lactated ringers Stopped (08/19/17 1602)  . milrinone 0.125 mcg/kg/min (09/08/17 0800)    PRN Medications: acetaminophen (TYLENOL) oral liquid 160 mg/5 mL, clonazepam, hydrALAZINE, lactated ringers, levalbuterol, ondansetron (ZOFRAN) IV, oxyCODONE, RESOURCE THICKENUP CLEAR, sodium chloride flush    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   Directly admitted with persistent low cardiac output for milrinone initiation for home.  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 Acute/Chronic systolic CHF with biventricular failure->  cardiogenic shock: Echo  08/13/2017 EF 20-25%. s/p  Medtronic BiV ICD in place. Cath 12/18 with stable 1v CAD.  s/p HM-3 implant 6/17. Unable to close chest due to high-intrathoracic pressures and RV failure. Taken back to OR 08/17/17 with evacuation of hematoma with improvement. Extubated 6/28. CO-OX 61%. Stop milrinone.   -Increase torsemide 60 mg daily.  Renal function stable.  -Continue spiro 25 mg daily.  2. 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  LDH 205. Plan ramp echo tomorrow.  - VAD interrogated personally. Parameters stable. - Continue warfarin. INR 2.1. Discussed with pharmacy.  - Continue asa 81  mg daily.  3. Acute respiratory failure post-op: Stable on [place].  4. Acute on CKD II-III:  Creatinine 1.2 5. [name]/VF: Recurrent VT on 6/22.  S/P VF 6/24 emergent bedside cardioversion. S/P VT 6/26 emergent bedside cardioversion.  - VT quiescent on amiodarone.  - K 3.8  Supp K  -Mag 1.8.  Start mag 400 mg po bid.  - Keep K > 4.0 Mg > 2.0.   6. AFL/atrial fibrillation: S/p previous ablation. He is currently in atrial fibrillation.  - Continue  amiodarone to 200 mg daily, then stop if rate remains controlled.  7. [name]: Recent cath with stable CAD as above: No chest pain.  8. [name]: Recent A1c 8.3 on 6/3.  -Blood sugars better controlled.  -continue current regimen.  9. Anemia: Post-op.  Got 1 unit PRBCs 6/22 and 6/25. - Stable. Hgb 9  10. Fevers: Afebrile. Cx negative. Has completed meropenem.  11. Deconditioning - Severe. PT following.  - CIR following for possible admission this week.  12. Dysphagia: Failed repeat swallow study 7/5. Remains dysphagia-2 diet. Speech Therapy following closely.  13. Hypernatremia: In setting of dysphagia 2 diet with thickened liquids so access to free water is reduced.  -Resolved.  14. Unstageable Pressure Ulcer: R and L buttock.  R buttock with necrosis noted. Continue santyl for selective debridement. Change daily.  Continue air overlay.  Needs to reposition every hour when in the chair. Keep R /L in bed. WOC has seen. May need a different chair cushion.   Should be ready for CIR 24-48 hours.   Length of Stay: [place], NP  09/08/2017, 8:31 AM  Advanced Heart Failure Team Pager [phone] (M-F; [place])  Please contact [facility] for night-coverage after hours (4p -7a ) and weekends on [email or web address]  Patient seen with NP, agree with the above note. He remains on milrinone 0.125 with co-ox 61%.  CVP line not working.   Normal LVAD sounds. JVP 12 cm. No edema. LVAD parameters reviewed and stable.   Stop milrinone today.  I do think he is volume overloaded still.  - Diurese with Lasix 60 mg IV bid today.  Hold torsemide today, possibly restart tomorrow.   Ramp echo tomorrow after he has been off milrinone.   INR therapeutic.   HR controlled, chronic atrial fibrillation.  Off amiodarone.    Continue to work on PT/mobilization.Will need CIR, probably Friday.   Loralie Champagne 09/08/2017 10:01 AM

## 2017-09-09 ENCOUNTER — Inpatient Hospital Stay (HOSPITAL_COMMUNITY): Payer: PPO

## 2017-09-09 DIAGNOSIS — I340 Nonrheumatic mitral (valve) insufficiency: Secondary | ICD-10-CM

## 2017-09-09 LAB — COOXEMETRY PANEL
Carboxyhemoglobin: 2.3 % — ABNORMAL HIGH (ref 0.5–1.5)
Methemoglobin: 1 % (ref 0.0–1.5)
O2 Saturation: 52.1 %
Total hemoglobin: 9.3 g/dL — ABNORMAL LOW (ref 12.0–16.0)

## 2017-09-09 LAB — GLUCOSE, CAPILLARY
GLUCOSE-CAPILLARY: 94 mg/dL (ref 70–99)
Glucose-Capillary: 121 mg/dL — ABNORMAL HIGH (ref 70–99)
Glucose-Capillary: 124 mg/dL — ABNORMAL HIGH (ref 70–99)
Glucose-Capillary: 154 mg/dL — ABNORMAL HIGH (ref 70–99)

## 2017-09-09 LAB — COMPREHENSIVE METABOLIC PANEL
ALK PHOS: 75 U/L (ref 38–126)
ALT: 24 U/L (ref 0–44)
ANION GAP: 9 (ref 5–15)
AST: 25 U/L (ref 15–41)
Albumin: 2.4 g/dL — ABNORMAL LOW (ref 3.5–5.0)
BILIRUBIN TOTAL: 1.1 mg/dL (ref 0.3–1.2)
BUN: 21 mg/dL (ref 8–23)
CALCIUM: 8.9 mg/dL (ref 8.9–10.3)
CO2: 24 mmol/L (ref 22–32)
CREATININE: 1.11 mg/dL (ref 0.61–1.24)
Chloride: 107 mmol/L (ref 98–111)
GFR calc Af Amer: >60 mL/min (ref >60)
GFR calc non Af Amer: >60 mL/min (ref >60)
Glucose, Bld: 119 mg/dL — ABNORMAL HIGH (ref 70–99)
Potassium: 4.1 mmol/L (ref 3.5–5.1)
SODIUM: 140 mmol/L (ref 135–145)
TOTAL PROTEIN: 7.4 g/dL (ref 6.5–8.1)

## 2017-09-09 LAB — CBC
HEMATOCRIT: 30.6 % — AB (ref 39.0–52.0)
HEMOGLOBIN: 9.1 g/dL — AB (ref 13.0–17.0)
MCH: 25.3 pg — ABNORMAL LOW (ref 26.0–34.0)
MCHC: 29.7 g/dL — AB (ref 30.0–36.0)
MCV: 85 fL (ref 78.0–100.0)
Platelets: 326 10*3/uL (ref 150–400)
RBC: 3.6 MIL/uL — AB (ref 4.22–5.81)
RDW: 18.6 % — ABNORMAL HIGH (ref 11.5–15.5)
WBC: 6.6 10*3/uL (ref 4.0–10.5)

## 2017-09-09 LAB — LACTATE DEHYDROGENASE: LDH: 197 U/L — ABNORMAL HIGH (ref 98–192)

## 2017-09-09 LAB — PROTIME-INR
INR: 1.97
Prothrombin Time: 22.2 seconds — ABNORMAL HIGH (ref 11.4–15.2)

## 2017-09-09 LAB — MAGNESIUM: Magnesium: 1.9 mg/dL (ref 1.7–2.4)

## 2017-09-09 MED ORDER — FUROSEMIDE 10 MG/ML IJ SOLN
60.0000 mg | Freq: Once | INTRAMUSCULAR | Status: AC
  Administered 2017-09-09: 60 mg via INTRAVENOUS

## 2017-09-09 MED ORDER — WARFARIN SODIUM 3 MG PO TABS
3.5000 mg | ORAL_TABLET | Freq: Once | ORAL | Status: AC
  Administered 2017-09-09: 3.5 mg via ORAL
  Filled 2017-09-09: qty 1

## 2017-09-09 MED ORDER — POTASSIUM CHLORIDE 20 MEQ PO PACK
40.0000 meq | PACK | Freq: Every day | ORAL | Status: DC
  Administered 2017-09-09 – 2017-09-10 (×2): 40 meq via ORAL
  Filled 2017-09-09 (×2): qty 2

## 2017-09-09 NOTE — Progress Notes (Signed)
LVAD Coordinator Education Note:   Completed LVAD education today by reviewing:  Emergency Situations: xDefining HMIII LVAD Emergency xSteps to take during an emergency xHow to identify power/connection problems xEmergency Telephone Contacts xEmergency Transportation Pan  Hands On Training for Patient and Support Person Competency: xMaking and Breaking connections, one at a time xSwitching power sources (MPU to Battery, Battery to [employer]) [place] batteries, one at a time [place] controller self-test (daily) xTroubleshooting advisories/alarms   Informational Materials have been provided and reviewed in entirety: xD/C Instructions Sheet xList of emergency contacts xGuide to Alerts and [place] x 2 xGuide to Replacing System Controller xHome Log Sheets xLVAD Emergency Contact Sheets    Reinforced that at each clinic visit they will bring medications/list of medications and log book entries of parameters/weights.   Mr. Sutcliffe and his caregiver [name] confirmed adequate training and answering of all questions at this time. They passed the HeartMate III Discharge Test at 92%, and incorrect answers were reviewed. Pt is ready to discharge to CIR tomorrow.  Total Elapsed Time: 120 minutes  Tanda Rockers RN, BSN VAD Coordinator 24/7 Pager [phone]

## 2017-09-09 NOTE — Progress Notes (Signed)
[place] for coumadin Indication: LVAD  Allergies  Allergen Reactions  . Penicillins Hives, Itching and Other (See Comments)    Has patient had a PCN reaction causing immediate rash, facial/tongue/throat swelling, SOB or lightheadedness with hypotension:# # Yes # # Has patient had a PCN reaction causing severe rash involving mucus membranes or skin necrosis: No Has patient had a PCN reaction that required hospitalization: No Has patient had a PCN reaction occurring within the last 10 years: No If all of the above answers are "NO", then may proceed with Cephalosporin use.     Patient Measurements: Height: 5\' 11"  (180.3 cm) Weight: 216 lb 7.9 oz (98.2 kg) IBW/kg (Calculated) : 75.3 Heparin Dosing Weight: 98.3 kg  Vital Signs: Temp: 97.7 F (36.5 C) (07/11 0727) Temp Source: Oral (07/11 0727) BP: 93/69 (07/11 0727) Pulse Rate: 63 (07/11 0727)  Labs: Recent Labs    09/07/17 0500 09/07/17 0742 09/08/17 0449 09/09/17 0500  HGB 8.7*  --  9.0* 9.1*  HCT 28.6*  --  29.2* 30.6*  PLT 323  --  285 326  LABPROT  --  21.6* 22.2* 22.2*  INR  --  1.89 1.96 1.97  CREATININE 1.15  --  1.18 1.11    Estimated Creatinine Clearance: 77.2 mL/min (by C-G formula based on SCr of 1.11 mg/dL).   Medical History: Past Medical History:  Diagnosis Date  . AICD (automatic cardioverter/defibrillator) present 02/05/2014   Upgrade to Medtronic biventricular ICD, serial number  BLD [id number]   . Atrial flutter ([place]) 04/2012   s/p TEE-EPS+RFCA 04/2012  . CAD (coronary artery disease) 8032,1224 X 2    RCA-T, 70% PL (off CFX), 99% Prox LAD/90% Dist LAD, S/P TAXUS stent x 2  . CHF (congestive heart failure) ([place])   . Chronic anticoagulation   . Chronic systolic heart failure ([place])   . CKD (chronic kidney disease)   . Diabetic retinopathy ([place])   . DM type 2 (diabetes mellitus, type 2) ([facility])    insulin dependent  . HTN (hypertension)   . Hypercholesteremia     ablation  . ICD (implantable cardiac defibrillator) in place   . Ischemic cardiomyopathy March 2015   20-25% 2D   . Nephrolithiasis   . Ventricular tachycardia ([place])     Assessment: 26 yom who underwent LVAD placement on 6/17  INR holding stable from 1.89 to 1.96 to 1.97. Hgb 9, plt 326, LDH 1.97- stable. No s/sx of bleeding documented. Meal intake continues to improve (50% of meals documented last). Amiodarone discontinued on 7/9.   Goal of Therapy:  INR goal 2-2.5 Monitor platelets by anticoagulation protocol: Yes   Plan:   Warfarin 3.5 mg tonight. Monitor s/s bleeding Will provide warfarin education prior to discharge  Erin Hearing PharmD., BCPS Clinical Pharmacist 09/09/2017 9:05 AM

## 2017-09-09 NOTE — Progress Notes (Signed)
  Echocardiogram 2D Echocardiogram has been performed.  [name] [name] 09/09/2017, 4:55 PM

## 2017-09-09 NOTE — Progress Notes (Signed)
CSW met briefly with patient and wife at bedside. They were in the midst of VAD education/training with VAD Coordinator. Patient in good spirits and states ready for CIR. CSW will follow up with patient post rehab in the VAD clinic. Raquel Sarna, [place], [place]

## 2017-09-09 NOTE — Progress Notes (Addendum)
Patient ID: [name] [name], male   DOB: [birth year], 67 y.o.   MRN: [id number]     Advanced Heart Failure Rounding Note  PCP-Cardiologist: No primary care provider on file.   Subjective:    Events 6/17 Underwent HM-3 implant -> Unable to close chest with RV failure and high intrathoracic pressure 6/18 Taken back to OR for evacuation of hematoma. NO RVAD needed. Left sternum open  08/19/17 Taken back to OR for sternal closure 6/22 VT => amiodarone started.  6/24 VT/VF=>urgent cardioversion. 6/25 VT  =>urgent cardioversion  6/27 Extubated  Milrinone stopped 7/10. Todays CO-OX 52%. Yesterday diuresed with IV lasix. Weight down 5 pounds.   Hungry. Denies SOB.   LVAD Interrogation HM 3: Speed: 5200 Flow: 3.4 PI: 5. Power: 4 No PI events. VAD interrogated personally. Parameters stable.   Objective:   Weight Range: 216 lb 7.9 oz (98.2 kg) Body mass index is 30.19 kg/m.   Vital Signs:   Temp:  [97.4 F (36.3 C)-97.9 F (36.6 C)] 97.7 F (36.5 C) (07/11 0727) Pulse Rate:  [62-112] 63 (07/11 0727) Resp:  [18-25] 23 (07/11 0727) BP: (88-109)/(63-87) 93/69 (07/11 0727) SpO2:  [93 %-98 %] 95 % (07/11 0727) Weight:  [216 lb 7.9 oz (98.2 kg)] 216 lb 7.9 oz (98.2 kg) (07/11 0647) Last BM Date: 09/08/17  Weight change: Filed Weights   09/07/17 0500 09/08/17 0440 09/09/17 0647  Weight: 222 lb 10.6 oz (101 kg) 221 lb 9 oz (100.5 kg) 216 lb 7.9 oz (98.2 kg)   Intake/Output:   Intake/Output Summary (Last 24 hours) at 09/09/2017 0848 Last data filed at 09/09/2017 0307 Gross per 24 hour  Intake 920 ml  Output 2000 ml  Net -1080 ml   Physical Exam  Maps 80s  CVP not set up.  Physical Exam: GENERAL:Sitting in the chair. NAD HEENT: normal  NECK: Supple, JVP 9-10 .  2+ bilaterally, no bruits.  No lymphadenopathy or thyromegaly appreciated.   CARDIAC:  Mechanical heart sounds with LVAD hum present.  LUNGS:  Clear to auscultation bilaterally.  ABDOMEN:  Soft, round, nontender,  positive bowel sounds x4.     LVAD exit site:  Dressing dry and intact.  No erythema or drainage.  Stabilization device present and accurately applied.  Driveline dressing is being changed daily per sterile technique. EXTREMITIES:  Warm and dry, no cyanosis, clubbing, rash or edema. RUE PICC  NEUROLOGIC:  Alert and oriented x 4.  No aphasia.  No dysarthria.  Affect pleasant.       Telemetry   A Fib 70s   Labs    CBC Recent Labs    09/08/17 0449 09/09/17 0500  WBC 6.4 6.6  HGB 9.0* 9.1*  HCT 29.2* 30.6*  MCV 84.4 85.0  PLT 285 496   Basic Metabolic Panel Recent Labs    09/08/17 0449 09/09/17 0500  NA 139 140  K 3.8 4.1  CL 108 107  CO2 23 24  GLUCOSE 127* 119*  BUN 21 21  CREATININE 1.18 1.11  CALCIUM 8.6* 8.9  MG 1.8 1.9   Liver Function Tests Recent Labs    09/08/17 0449 09/09/17 0500  AST 26 25  ALT 26 24  ALKPHOS 74 75  BILITOT 1.7* 1.1  PROT 7.6 7.4  ALBUMIN 2.4* 2.4*   No results for input(s): LIPASE, AMYLASE in the last 72 hours. Cardiac Enzymes No results for input(s): CKTOTAL, CKMB, CKMBINDEX, TROPONINI in the last 72 hours.  BNP: BNP (last 3 results) Recent Labs  08/23/17 0024 08/30/17 0000 09/05/17 2327  BNP 450.5* 114.6* 86.1    ProBNP (last 3 results) Recent Labs    11/12/16 1559  PROBNP 388.0*     D-Dimer No results for input(s): DDIMER in the last 72 hours. Hemoglobin A1C No results for input(s): HGBA1C in the last 72 hours. Fasting Lipid Panel No results for input(s): CHOL, HDL, LDLCALC, TRIG, CHOLHDL, LDLDIRECT in the last 72 hours. Thyroid Function Tests No results for input(s): TSH, T4TOTAL, T3FREE, THYROIDAB in the last 72 hours.  Invalid input(s): FREET3  Other results:   Imaging    No results found.   Medications:     Scheduled Medications: . aspirin  81 mg Oral Daily  . bisacodyl  10 mg Oral Daily   Or  . bisacodyl  10 mg Rectal Daily  . Chlorhexidine Gluconate Cloth  6 each Topical QHS  .  collagenase   Topical Daily  . famotidine  20 mg Oral BID  . feeding supplement (ENSURE ENLIVE)  237 mL Oral BID BM  . furosemide  60 mg Intravenous BID  . insulin aspart  0-15 Units Subcutaneous TID WC  . insulin aspart  0-5 Units Subcutaneous QHS  . insulin detemir  25 Units Subcutaneous QHS  . magnesium oxide  400 mg Oral BID  . mouth rinse  15 mL Mouth Rinse BID  . nystatin  5 mL Oral QID  . potassium chloride  40 mEq Oral BID  . sodium chloride flush  10-40 mL Intracatheter Q12H  . spironolactone  25 mg Oral Daily  . Warfarin - Pharmacist Dosing Inpatient   Does not apply q1800    Infusions: . sodium chloride 10 mL/hr at 09/06/17 1031  . lactated ringers Stopped (08/19/17 1602)    PRN Medications: acetaminophen (TYLENOL) oral liquid 160 mg/5 mL, clonazepam, hydrALAZINE, lactated ringers, levalbuterol, ondansetron (ZOFRAN) IV, oxyCODONE, RESOURCE THICKENUP CLEAR, sodium chloride flush    Patient Profile  CORVIN SORBO is a 67 y.o. 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   Directly admitted with persistent low cardiac output for milrinone initiation for home.  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 Acute/Chronic systolic CHF with biventricular failure-> cardiogenic shock: Echo  08/13/2017 EF 20-25%. s/p  Medtronic BiV ICD in place. Cath 12/18 with stable 1v CAD.  s/p HM-3 implant 6/17. Unable to close chest due to high-intrathoracic pressures and RV failure. Taken back to OR 08/17/17 with evacuation of hematoma with improvement. Extubated 6/28. Co-OX 52% off milrinone. Ramp ECHO today.  -Volume status stable. Give one more dose of IV lasix then switch to torsemide 60 mg daily in am.    Renal function stable.  -Continue spiro 25 mg daily.  2. 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  LDH 197. Plan ramp echo today  - VAD interrogated personally.  Parameters stable. - Continue warfarin. -INR 2. Discussed with pharmacy.  - Continue asa 81 mg daily.  3. Acute respiratory failure post-op: Stable on [place].  4. Acute on CKD II-III:  Creatinine 1.1 5. [name]/VF: Recurrent VT on 6/22.  S/P VF 6/24 emergent bedside cardioversion. S/P VT 6/26 emergent bedside cardioversion.  Resolved.  -Mag 1,9. Continue mag 400 mg po bid.  - Keep K > 4.0 Mg > 2.0.   6. AFL/atrial fibrillation: S/p previous ablation. He is currently  in atrial fibrillation.  - Rate controlled. Amio stopped.   7. [name]: Recent cath with stable CAD as above: No chest pain.  8. [name]: Recent A1c 8.3 on 6/3.  -Blood sugars better controlled.  -continue current regimen.  9. Anemia: Post-op.  Got 1 unit PRBCs 6/22 and 6/25. - Stable. Hgb 9.1  10. Fevers: Afebrile. Cx negative. Has completed meropenem.  11. Deconditioning - Severe. PT following.  - CIR following for possible admission this week.  12. Dysphagia: Failed repeat swallow study 7/5. Remains dysphagia-2 diet. Speech Therapy following closely.  13. Hypernatremia: In setting of dysphagia 2 diet with thickened liquids so access to free water is reduced.  -Resolved.  14. Unstageable Pressure Ulcer: R and L buttock.  R buttock with necrosis noted. Continue santyl for selective debridement. Change daily.  Continue air overlay. Needs to reposition every hour when in the chair. Keep R /L in bed. WOC has seen. May need a different chair cushion.   Length of Stay: Smyth, NP  09/09/2017, 8:48 AM  Advanced Heart Failure Team Pager [phone] (M-F; 7a - 4p)  Please contact [facility] for night-coverage after hours (4p -7a ) and weekends on [email or web address]  Patient seen with NP, agree with the above note.    Volume looks better today, agree with 1 more dose IV Lasix then back to torsemide 60 mg daily tomorrow.   INR therapeutic.   Will leave off milrinone, co-ox 52% today.    Ramp echo today.    Hopefully to CIR tomorrow.   Loralie Champagne 09/09/2017 3:41 PM

## 2017-09-09 NOTE — Progress Notes (Signed)
Speed  Flow  PI  Power  LVIDD  AI  Aortic openings  MR  TR  Septum  RV   5200 3.5 6.4 3.7 5.4 Trace/mild 5/5  mod  mild midline mod  5300  3.4 6.9 3.7 5.4 Trace/mild 5/5 Mod mild midline mod  5400  4.0 4.4 3.8 5.3 Trace/mild 5/5 Mod mild Slight pull to left mod                                          Doppler MAP: 80   Ramp ECHO performed at bedside.  At completion of ramp study:  Fixed speed: 5300 Low speed limit: 5000  Tanda Rockers RN, VAD Coordinator 24/7 pager [phone]

## 2017-09-09 NOTE — Progress Notes (Signed)
  Speech Language Pathology Treatment: Dysphagia  Patient Details Name: [name] [name] MRN: [id number] DOB: 1950/11/25 Today's Date: 09/09/2017 Time: 1694-5038 SLP Time Calculation (min) (ACUTE ONLY): 24 min  Assessment / Plan / Recommendation Clinical Impression  Pt eating breakfast on SLP arrival. He is independently performing chin tuck with reminders to tuck tightly to chest. Cognition much improved and is continuing. He was surprised when reminded of aspiration of honey thick when without chin tuck. He prefers to tuck with food as well; reiterated he must with liquids. Wet vocal quality x 1. Pt using teaspoon amounts; observed with cup without difficulty however educated how crucial small sips are and he was able to verbalize at end of session . Volitional throat clear moderately weak due to suspected decreased vocal cord function. Continues to be mostly aphonic with momentary breaks of vocal cord adduction audible for first time today.   The basic EMST trainer did not provide enough resistance to benefit therefore pt given the EMST 150 today. 60 cm H2O was optimal setting and he demonstrated 10 trials appropriately. Pt educated to practice in room 3 times a day with 25 repetitions being goal if able (start with minimum of 10). Left written instructions and reminders for staff/family to remind pt. He has made excellent progress. Despite his cognitive improvements, would like to hold off on repeating MBS at present due to 2 prior MBS were close together without noteable improvements. Hopeful plan is CIR where he can continue dysphagia intervention. ST will continue to follow in acute setting.    HPI HPI: 67 y.o. 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       SLP Plan  Continue with current plan of care       Recommendations  Diet recommendations:  Dysphagia 2 (fine chop);Honey-thick liquid Liquids provided via: Cup Medication Administration: Crushed with puree Supervision: Patient able to self feed;Full supervision/cueing for compensatory strategies Compensations: Slow rate;Small sips/bites;Clear throat intermittently;Chin tuck(chin tuck liquids) Postural Changes and/or Swallow Maneuvers: Seated upright 90 degrees                Oral Care Recommendations: Oral care BID Follow up Recommendations: Inpatient Rehab SLP Visit Diagnosis: Dysphagia, oropharyngeal phase (R13.12) Plan: Continue with current plan of care       GO                Houston Siren 09/09/2017, 9:25 AM  Orbie Pyo Colvin Caroli.[employer] [phone]

## 2017-09-09 NOTE — Progress Notes (Signed)
LVAD Coordinator Rounding Note  Admitted 08/11/17 for initiation of Milrinone. 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 He was evaluated [facility] /Dr Posey Pronto on 5/9 for possible heart transplant.   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 Prescott Gum under Destination Therapy criteria. Dr [name] discussed heart transplant candidacy with Dr Posey Pronto. Given size and blood type there was concern he would not make it to transplant.   Pt sitting in chair, waiting for breakfast. His voice is still very weak, but says he's getting a "little stronger" every day.   Vital signs: Temp:  97.7 HR: 69 afib Auto BP:  93/69 (78) Doppler:  82 O2 Sat:  95 % on RA Wt: 238.....260>239>227>225>229>229>227>222>228>225>222>221>216 lbs   LVAD interrogation reveals:  Speed: 5200 Flow: 3.4 Power: 3.5w PI:  6.4 Alarms: none  Events: none Hematocrit:  31 Fixed speed: 5200 Low speed limit: 4900  Drive Line:  Left abd dressing dry and intact; anchors intact and accurately applied. Wife will change the dressing today.  Labs:  LDH trend: 274......324>384>425>423>397>267>306>239>245>236>223>205>216>205>197  INR trend:  1.71.......2.54>2.16>2.63>2.87>2.79>2.67>2.61>2.65>2.58>2.06>1.89>1.96>1.97  Anticoagulation Plan: -INR Goal: 2.0 - 2.5 -ASA Dose: 81 mg daily   Blood Products:  Intra op: - 08/16/17> one platelet; 2 FFP - 08/17/17> ome FFP  Post op: - 08/16/17 > one unit PCs - 08/21/17 > one unit PCs - 08/24/17 > one unit PCs  Device:  - BiV Medtronic - Therapies: off  Respiratory: extubated 08/27/17  Nitric Oxide: off 08/21/17  Gtts: - Epi 1 mcg/min - stopped 08/31/17 - Milrinone 0.125 mcg/kg/min - off 09/08/17  Adverse Events on VAD: - 08/17/17>mediastinal reexploration with evacuation of mediastinal clot - 08/19/17>sternal closure  VAD education:  1. Caregiver is changing drive line dressing independently.  2. Delivered HM III Patient Handbooks for wife and daughters  review. Asked them to complete reading of handbook.  3.VAD discharge education with patient and family scheduled for today, 09/09/17 at 2 pm.  Plan/Recommendations:  1. Daily dressing changes per VAD coordinator, nurse champion, or trained caregiver. 2. Page VAD Coordinator with any VAD equipment or drive line issues.  3. Will meet with family at 2 p today for discharge education.  Tanda Rockers RN, VAD Coordinator 24/7 VAD Pager: [phone]

## 2017-09-09 NOTE — Progress Notes (Signed)
Physical Therapy Treatment Patient Details Name: [name] [name] MRN: [id number] DOB: 03-07-1950 Today's Date: 09/09/2017    History of Present Illness Pt is a 67 y.o. male admitted 08/11/17 for milrinone s/p LVAD HM3 placement 6/18; return to OR same day for hematoma evacuation with delayed closure until 4/70, course complicated by cardiogenic shock, biventricular failure, cardioversions, VDRF. Extubated 6/28. PMhx: chronic systolic CHF due to ICM, [name], CAD, PAD, [name], HTN, HLD, [name]    PT Comments    Pt with improved gait tolerance today with chair follow and encouragement. He remains unable to recall sternal precautions and educated again. Pt able to transition from main power to battery with max cues and assist to don vest. With return to main power pt performed transition with min cues. Pt remains appropriate for CIR and encouraged OOB for meals and then off sacrum more throughout day for wound healing with increased trials of transfers.   Speed 5200, flow 3.3-3.6, PI 5.3-7.5, power 4.6 HR 69-76 BP 93/69 (78) 95% RA    Follow Up Recommendations  CIR;Supervision/Assistance - 24 hour     Equipment Recommendations  3in1 (PT);Other (comment)(rollator)    Recommendations for Other Services       Precautions / Restrictions Precautions Precautions: Sternal;Fall Precaution Comments: LVAD, sacral wound    Mobility  Bed Mobility Overal bed mobility: Needs Assistance Bed Mobility: Rolling;Sidelying to Sit Rolling: Min assist Sidelying to sit: Min assist       General bed mobility comments: cues for sequence with assist to elevate trunk  Transfers Overall transfer level: Needs assistance   Transfers: Sit to/from Stand Sit to Stand: Mod assist         General transfer comment: mod assist for anterior translation and rise to stand from bed and chair x 3 trials with cues for hands on thighs, min assist to control descent    Ambulation/Gait Ambulation/Gait assistance: Min assist;+2 safety/equipment Gait Distance (Feet): 85 Feet Assistive device: Rolling walker (2 wheeled) Gait Pattern/deviations: Step-through pattern;Decreased stride length;Trunk flexed   Gait velocity interpretation: <1.31 ft/sec, indicative of household ambulator General Gait Details: cues for posture, looking up and increased stride with encouragement to advance distance. Pt walked 22' then 3' with seated rest between trials grossly 2 min   Stairs             Wheelchair Mobility    Modified Rankin (Stroke Patients Only)       Balance Overall balance assessment: Needs assistance Sitting-balance support: No upper extremity supported Sitting balance-Leahy Scale: Good Sitting balance - Comments: supervision EOB   Standing balance support: Bilateral upper extremity supported Standing balance-Leahy Scale: Poor Standing balance comment: bil UE support in standing                            Cognition Arousal/Alertness: Awake/alert Behavior During Therapy: WFL for tasks assessed/performed Overall Cognitive Status: Impaired/Different from baseline Area of Impairment: Memory;Following commands;Safety/judgement                   Current Attention Level: Alternating Memory: Decreased recall of precautions Following Commands: Follows one step commands consistently Safety/Judgement: Decreased awareness of deficits   Problem Solving: Decreased initiation;Difficulty sequencing;Requires verbal cues General Comments: pt able to recall only 1 precaution, educateed for all, able to process power transition and after mod assist for initial transition to battery only required min cues to return to main power  Exercises      General Comments        Pertinent Vitals/Pain Pain Score: 8  Pain Location: buttocks Pain Descriptors / Indicators: Aching;Sore Pain Intervention(s): Limited activity within  patient's tolerance;Repositioned    Home Living                      Prior Function            PT Goals (current goals can now be found in the care plan section) Progress towards PT goals: Progressing toward goals    Frequency           PT Plan Current plan remains appropriate    Co-evaluation              AM-PAC PT "6 Clicks" Daily Activity  Outcome Measure    Difficulty moving from lying on back to sitting on the side of the bed? : Unable Difficulty sitting down on and standing up from a chair with arms (e.g., wheelchair, bedside commode, etc,.)?: Unable Help needed moving to and from a bed to chair (including a wheelchair)?: A Lot Help needed walking in hospital room?: A Little Help needed climbing 3-5 steps with a railing? : Total 6 Click Score: 8    End of Session Equipment Utilized During Treatment: Gait belt Activity Tolerance: Patient tolerated treatment well Patient left: in chair;with call bell/phone within reach;with family/visitor present Nurse Communication: Mobility status;Precautions PT Visit Diagnosis: Unsteadiness on feet (R26.81);Other abnormalities of gait and mobility (R26.89);Muscle weakness (generalized) (M62.81)     Time: 9169-4503 PT Time Calculation (min) (ACUTE ONLY): 33 min  Charges:  $Gait Training: 8-22 mins $Therapeutic Activity: 8-22 mins                    G Codes:       Elwyn Reach, PT [phone]    [place] 09/09/2017, 8:53 AM

## 2017-09-09 NOTE — Discharge Summary (Addendum)
Advanced Heart Failure Team  Discharge Summary   Patient ID: [name] [name] MRN: [id number], DOB/AGE: Jun 02, 1950 67 y.o. Admit date: 08/11/2017 D/C date:     09/10/2017   Primary Discharge Diagnoses:  1.Acute/Chronic end-stage systolic HF with biventricular failure-> cardiogenic shock 2. 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   INR Goal 2-.2.5  Aspirin 81 mg daily  3. Acute respiratory failure post-op: Stable on [place].  4. 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 [name]/VF 6. AFL/atrial fibrillation   7. [name] 8. [name]:  9. Anemia 10. Fevers 11. Deconditioning 12. Dysphagia 13. Hypernatremia 14. Unstageable Pressure Ulcer: R and L buttock Unstageable Pressure Ulcer: R and L buttock ith necrosis noted.   Hospital Course: [name] [name] is a 67 y.o. 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   He was evaluated at [facility] by Dr Posey Pronto on 07/08/17 for possible heart transplant. Dr [name] discussed with Dr Posey Pronto. Given size and blood type O+ there was concern he would not make it to transplant.   Admitted 6/12 with recurrent A/C systolic heart failure. Diuresed with IV lasix but with poor results so milrinone was added to improve mixed venous saturation and augment diuresis.  Unfortunately mixed venous saturation remained dropped so milrinone was increased to 0.375 mcg. Mixed venous saturation remained low so advanced therapy was pursued. He underwent RHC on 6/14 with swan placement for optimization prior to [place].   Evaluated by CT surgery/VAD team and deemed appropriate for HMIII under DT criteria. He underwent HMIII on 6/17. Chest was unable to be closed due to high intrathoracic pressure and RV failure.  He returned to the OR on 6/18 for evacuation of hematoma. Chest was later closed on 6/20. Post operative course complicated by  open chest, RV failure, recurrent  VT, respiratory failure, and dysphagia. CCM consulted for vent management with extubation completed on 08/26/17.  As he improved all drips gradually weaned off. Prior to d/c  ramp echo was completed with speed optimized.  Due to severe deconditioning, PT/ST consulted with recommendations for CIR. Plan to continue PT for hydrotherapy for pressure ulcer on r/l buttock.    VAD coordinators completed extensive patient and family education. VAD/HF team will continue to follow daily on CIR.   Today ICD therapies were turned back on.He was evaluated by Dr Erenest Blank t and Dr Aundra Dubin and deemed stable for d/c to CIR.  1.Acute/Chronic systolic CHF with biventricular failure-> cardiogenic shock: Echo  08/13/2017 EF 20-25%.s/p  Medtronic BiV ICD in place. Cath 12/18 with stable 1v CAD.  s/p HM-3 implant 6/17. Unable to close chest due to high-intrathoracic pressures and RV failure. Taken back to OR 08/17/17 with evacuation of hematoma with improvement. Extubated 6/28. Through out hospitalization he diuresed with IV lasix and later transitioned to torsemide. Overall diuresed 56 pounds.  -  Renal function stable.  -Continue spiro 25 mg daily. And digoxin 0.125 mg daily.   2. 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 - On aspirin and coumadin. INR goal 2-2.5  -Ramp ECHO completed 09/09/2017 with speed increased to 5300.  - VAD interrogated personally. Parameters stable. 3. Acute respiratory failure post-op:  CCM consult for vent management and wean. Extubated 08/26/17.  Oxygen gradually weaned off. O2 sats stable on room air.  4. 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  Creatinine peaked at 1.67. Todays creatinine 1.1. Stable.   5. [name]/VF: Recurrent VT on 6/22, VF 6/24, and VT 6/26 requiring emergent bedside cardioversion.  - Loaded on amiodarone and later stopped.  6. AFL/atrial fibrillation: S/p previous ablation. He is currently in atrial fibrillation.  - Rate controlled. Amio stopped.   7. CAD s/p PCI  of RCA and LAD: Recent cath with stable CAD as above: No chest pain. Continue statin.   8. [name]: Recent A1c8.3 on 6/3. Continue sliding scale.   9. Anemia: Post-op.  Got 1 unit PRBCs 6/22 and 6/25. - Stable. Hgb 9.1  10. Fevers: Afebrile. Cx negative. Has completed meropenem.  11. Deconditioning - PT consulted with recommendations for CIR.  12. Dysphagia: Failed repeat swallow study 7/5. Remains dysphagia-2 diet. Speech Therapy following closely.  13. Hypernatremia: In setting of dysphagia 2 diet with thickened liquids so access to free water is reduced.  -Resolved.  14. Unstageable Pressure Ulcer: R and L buttock.  R buttock with necrosis noted. Continue santyl for selective debridement. Continue PT for hydrotherapy.   LVAD Interrogation HM 3: Speed: 5300 Flow: 4 PI: 4.2 Power: 4 No PI events. VAD interrogated personally. Parameters stable.  Discharge Weight: 216 pounds.  Discharge Vitals: Blood pressure 98/80, pulse 70, temperature 98 F (36.7 C), temperature source Oral, resp. rate 20, height 5\' 11"  (1.803 m), weight 215 lb 8 oz (97.8 kg), SpO2 100 %.  Labs: Lab Results  Component Value Date   WBC 6.0 09/10/2017   HGB 9.2 (L) 09/10/2017   HCT 30.1 (L) 09/10/2017   MCV 83.6 09/10/2017   PLT 274 09/10/2017    Recent Labs  Lab 09/10/17 0500  NA 137  K 3.9  CL 101  CO2 25  BUN 19  CREATININE 1.19  CALCIUM 8.8*  PROT 7.4  BILITOT 1.4*  ALKPHOS 74  ALT 23  AST 27  GLUCOSE 125*   Lab Results  Component Value Date   CHOL 116 04/27/2016   HDL 31 (L) 04/27/2016   LDLCALC 74 04/27/2016   TRIG 57 04/27/2016   BNP (last 3 results) Recent Labs    08/23/17 0024 08/30/17 0000 09/05/17 2327  BNP 450.5* 114.6* 86.1    ProBNP (last 3 results) Recent Labs    11/12/16 1559  PROBNP 388.0*     Diagnostic Studies/Procedures   No results found.  Discharge Medications   Allergies as of 09/10/2017      Reactions   Penicillins Hives, Itching, Other (See  Comments)   Has patient had a PCN reaction causing immediate rash, facial/tongue/throat swelling, SOB or lightheadedness with hypotension:# # Yes # # Has patient had a PCN reaction causing severe rash involving mucus membranes or skin necrosis: No Has patient had a PCN reaction that required hospitalization: No Has patient had a PCN reaction occurring within the last 10 years: No If all of the above answers are "NO", then may proceed with Cephalosporin use.      Medication List    STOP taking these medications   apixaban 5 MG Tabs tablet Commonly known as:  ELIQUIS   digoxin 0.125 MG tablet Commonly known as:  LANOXIN   Dulaglutide 1.5 MG/0.5ML Sopn Commonly known as:  TRULICITY   gabapentin 517 MG capsule Commonly known as:  NEURONTIN   insulin NPH-regular Human (70-30) 100 UNIT/ML injection Commonly known as:  NOVOLIN 70/30 RELION   losartan 25 MG tablet Commonly known as:  COZAAR   magnesium oxide 400 MG tablet  Commonly known as:  MAG-OX   metFORMIN 750 MG 24 hr tablet Commonly known as:  GLUCOPHAGE-XR   MULTIVITAMIN PO   sotalol 120 MG tablet Commonly known as:  BETAPACE     TAKE these medications   acetaminophen 500 MG tablet Commonly known as:  TYLENOL Take 1,000 mg by mouth every 6 (six) hours as needed for moderate pain or headache.   aspirin 81 MG chewable tablet Chew 1 tablet (81 mg total) by mouth daily.   atorvastatin 80 MG tablet Commonly known as:  LIPITOR TAKE ONE TABLET BY MOUTH ONCE DAILY What changed:    how much to take  how to take this  when to take this   bisacodyl 5 MG EC tablet Commonly known as:  DULCOLAX Take 2 tablets (10 mg total) by mouth daily.   bisacodyl 10 MG suppository Commonly known as:  DULCOLAX Place 1 suppository (10 mg total) rectally daily.   collagenase ointment Commonly known as:  SANTYL Apply topically daily.   famotidine 20 MG tablet Commonly known as:  PEPCID Take 1 tablet (20 mg total) by mouth 2  (two) times daily.   feeding supplement (ENSURE ENLIVE) Liqd Take 237 mLs by mouth 2 (two) times daily between meals.   insulin aspart 100 UNIT/ML injection Commonly known as:  novoLOG Inject 0-15 Units into the skin 3 (three) times daily with meals.   insulin aspart 100 UNIT/ML injection Commonly known as:  novoLOG Inject 0-5 Units into the skin at bedtime.   insulin detemir 100 UNIT/ML injection Commonly known as:  LEVEMIR Inject 0.25 mLs (25 Units total) into the skin at bedtime.   levalbuterol 0.63 MG/3ML nebulizer solution Commonly known as:  XOPENEX Take 3 mLs (0.63 mg total) by nebulization every 6 (six) hours as needed for wheezing or shortness of breath.   magnesium oxide 400 (241.3 Mg) MG tablet Commonly known as:  MAG-OX Take 1 tablet (400 mg total) by mouth 2 (two) times daily.   mouth rinse Liqd solution 15 mLs by Mouth Rinse route 2 (two) times daily.   nystatin 100000 UNIT/ML suspension Commonly known as:  MYCOSTATIN Take 5 mLs (500,000 Units total) by mouth 4 (four) times daily.   ondansetron 4 MG/2ML Soln injection Commonly known as:  ZOFRAN Inject 2 mLs (4 mg total) into the vein every 6 (six) hours as needed for nausea or vomiting.   potassium chloride 20 MEQ packet Commonly known as:  KLOR-CON Take 40 mEq by mouth daily.   RESOURCE THICKENUP CLEAR Powd Take 120 g by mouth as needed.   spironolactone 25 MG tablet Commonly known as:  ALDACTONE Take 1 tablet (25 mg total) by mouth daily.   torsemide 20 MG tablet Commonly known as:  DEMADEX Take 3 tablets (60 mg total) by mouth daily. What changed:    medication strength  how much to take  when to take this  additional instructions   warfarin 5 MG tablet Commonly known as:  COUMADIN Take 1 tablet (5 mg total) by mouth one time only at 6 PM.       Disposition   The patient will be discharged in stable condition to home. Discharge Instructions    (HEART FAILURE PATIENTS) Call MD:   Anytime you have any of the following symptoms: 1) 3 pound weight gain in 24 hours or 5 pounds in 1 week 2) shortness of breath, with or without a dry hacking cough 3) swelling in the hands, feet or stomach 4) if you  have to sleep on extra pillows at night in order to breathe.   Complete by:  As directed    Amb Referral to Cardiac Rehabilitation   Complete by:  As directed    Diagnosis:   Valve Repair Heart Failure (see criteria below if ordering Phase II)     Heart Failure Type:  Chronic Systolic & Diastolic Comment - VAD   Valve:  Tricuspid   Diet - low sodium heart healthy   Complete by:  As directed    Increase activity slowly   Complete by:  As directed    Page VAD Coordinator at [phone]  Notify for: any VAD alarms, sustained elevations of power >10 watts, sustained drop in Pulse Index <3   Complete by:  As directed    Notify for:   any VAD alarms sustained elevations of power >10 watts sustained drop in Pulse Index <3     Speed Settings:   Complete by:  As directed    Fixed 5300 RPM Low 5000 RPM         Duration of Discharge Encounter: Greater than 35 minutes   Signed, Amy Clegg NP_C  09/10/2017, 10:40 AM   Agree with above. Patient discharged to rehab by Dr. Aundra Dubin. Chart reviewed for documentation purposes. Agree with above.  Glori Bickers, MD  8:03 PM

## 2017-09-10 ENCOUNTER — Inpatient Hospital Stay (HOSPITAL_COMMUNITY)
Admission: RE | Admit: 2017-09-10 | Discharge: 2017-09-25 | DRG: 945 | Disposition: A | Discharge status: Home Health | Payer: PPO | Source: Intra-hospital | Attending: Physical Medicine & Rehabilitation | Admitting: Physical Medicine & Rehabilitation

## 2017-09-10 ENCOUNTER — Other Ambulatory Visit: Payer: Self-pay

## 2017-09-10 DIAGNOSIS — Z95811 Presence of heart assist device: Secondary | ICD-10-CM | POA: Diagnosis not present

## 2017-09-10 DIAGNOSIS — K629 Disease of anus and rectum, unspecified: Secondary | ICD-10-CM | POA: Diagnosis not present

## 2017-09-10 DIAGNOSIS — I5022 Chronic systolic (congestive) heart failure: Secondary | ICD-10-CM | POA: Diagnosis not present

## 2017-09-10 DIAGNOSIS — G4700 Insomnia, unspecified: Secondary | ICD-10-CM | POA: Diagnosis not present

## 2017-09-10 DIAGNOSIS — E871 Hypo-osmolality and hyponatremia: Secondary | ICD-10-CM | POA: Diagnosis not present

## 2017-09-10 DIAGNOSIS — I2510 Atherosclerotic heart disease of native coronary artery without angina pectoris: Secondary | ICD-10-CM | POA: Diagnosis present

## 2017-09-10 DIAGNOSIS — N429 Disorder of prostate, unspecified: Secondary | ICD-10-CM | POA: Diagnosis not present

## 2017-09-10 DIAGNOSIS — L89302 Pressure ulcer of unspecified buttock, stage 2: Secondary | ICD-10-CM | POA: Diagnosis not present

## 2017-09-10 DIAGNOSIS — Z833 Family history of diabetes mellitus: Secondary | ICD-10-CM | POA: Diagnosis not present

## 2017-09-10 DIAGNOSIS — D62 Acute posthemorrhagic anemia: Secondary | ICD-10-CM | POA: Diagnosis not present

## 2017-09-10 DIAGNOSIS — Z955 Presence of coronary angioplasty implant and graft: Secondary | ICD-10-CM

## 2017-09-10 DIAGNOSIS — E7800 Pure hypercholesterolemia, unspecified: Secondary | ICD-10-CM | POA: Diagnosis present

## 2017-09-10 DIAGNOSIS — Z79899 Other long term (current) drug therapy: Secondary | ICD-10-CM

## 2017-09-10 DIAGNOSIS — G479 Sleep disorder, unspecified: Secondary | ICD-10-CM | POA: Diagnosis not present

## 2017-09-10 DIAGNOSIS — I252 Old myocardial infarction: Secondary | ICD-10-CM | POA: Diagnosis not present

## 2017-09-10 DIAGNOSIS — Z87891 Personal history of nicotine dependence: Secondary | ICD-10-CM | POA: Diagnosis not present

## 2017-09-10 DIAGNOSIS — Z794 Long term (current) use of insulin: Secondary | ICD-10-CM

## 2017-09-10 DIAGNOSIS — E1122 Type 2 diabetes mellitus with diabetic chronic kidney disease: Secondary | ICD-10-CM | POA: Diagnosis not present

## 2017-09-10 DIAGNOSIS — E11319 Type 2 diabetes mellitus with unspecified diabetic retinopathy without macular edema: Secondary | ICD-10-CM | POA: Diagnosis present

## 2017-09-10 DIAGNOSIS — R1310 Dysphagia, unspecified: Secondary | ICD-10-CM

## 2017-09-10 DIAGNOSIS — I130 Hypertensive heart and chronic kidney disease with heart failure and stage 1 through stage 4 chronic kidney disease, or unspecified chronic kidney disease: Secondary | ICD-10-CM | POA: Diagnosis not present

## 2017-09-10 DIAGNOSIS — Z7901 Long term (current) use of anticoagulants: Secondary | ICD-10-CM | POA: Diagnosis not present

## 2017-09-10 DIAGNOSIS — N183 Chronic kidney disease, stage 3 unspecified: Secondary | ICD-10-CM | POA: Diagnosis present

## 2017-09-10 DIAGNOSIS — E876 Hypokalemia: Secondary | ICD-10-CM | POA: Diagnosis not present

## 2017-09-10 DIAGNOSIS — R7309 Other abnormal glucose: Secondary | ICD-10-CM | POA: Diagnosis not present

## 2017-09-10 DIAGNOSIS — I255 Ischemic cardiomyopathy: Secondary | ICD-10-CM | POA: Diagnosis not present

## 2017-09-10 DIAGNOSIS — K6289 Other specified diseases of anus and rectum: Secondary | ICD-10-CM

## 2017-09-10 DIAGNOSIS — L89320 Pressure ulcer of left buttock, unstageable: Secondary | ICD-10-CM | POA: Diagnosis present

## 2017-09-10 DIAGNOSIS — L89152 Pressure ulcer of sacral region, stage 2: Secondary | ICD-10-CM

## 2017-09-10 DIAGNOSIS — R5381 Other malaise: Principal | ICD-10-CM

## 2017-09-10 DIAGNOSIS — L89310 Pressure ulcer of right buttock, unstageable: Secondary | ICD-10-CM | POA: Diagnosis present

## 2017-09-10 DIAGNOSIS — E1121 Type 2 diabetes mellitus with diabetic nephropathy: Secondary | ICD-10-CM | POA: Diagnosis not present

## 2017-09-10 DIAGNOSIS — I5043 Acute on chronic combined systolic (congestive) and diastolic (congestive) heart failure: Secondary | ICD-10-CM | POA: Diagnosis not present

## 2017-09-10 DIAGNOSIS — E119 Type 2 diabetes mellitus without complications: Secondary | ICD-10-CM

## 2017-09-10 DIAGNOSIS — I4891 Unspecified atrial fibrillation: Secondary | ICD-10-CM | POA: Diagnosis present

## 2017-09-10 DIAGNOSIS — I5082 Biventricular heart failure: Secondary | ICD-10-CM | POA: Diagnosis not present

## 2017-09-10 DIAGNOSIS — I5023 Acute on chronic systolic (congestive) heart failure: Secondary | ICD-10-CM | POA: Diagnosis not present

## 2017-09-10 DIAGNOSIS — Z95810 Presence of automatic (implantable) cardiac defibrillator: Secondary | ICD-10-CM

## 2017-09-10 DIAGNOSIS — I723 Aneurysm of iliac artery: Secondary | ICD-10-CM | POA: Diagnosis not present

## 2017-09-10 LAB — MAGNESIUM: Magnesium: 2 mg/dL (ref 1.7–2.4)

## 2017-09-10 LAB — ECHOCARDIOGRAM LIMITED
Height: 71 in
WEIGHTICAEL: 3463.87 [oz_av]

## 2017-09-10 LAB — CBC
HEMATOCRIT: 30.1 % — AB (ref 39.0–52.0)
HEMOGLOBIN: 9.2 g/dL — AB (ref 13.0–17.0)
MCH: 25.6 pg — AB (ref 26.0–34.0)
MCHC: 30.6 g/dL (ref 30.0–36.0)
MCV: 83.6 fL (ref 78.0–100.0)
Platelets: 274 10*3/uL (ref 150–400)
RBC: 3.6 MIL/uL — AB (ref 4.22–5.81)
RDW: 18.5 % — ABNORMAL HIGH (ref 11.5–15.5)
WBC: 6 10*3/uL (ref 4.0–10.5)

## 2017-09-10 LAB — GLUCOSE, CAPILLARY
GLUCOSE-CAPILLARY: 90 mg/dL (ref 70–99)
GLUCOSE-CAPILLARY: 132 mg/dL — AB (ref 70–99)
Glucose-Capillary: 98 mg/dL (ref 70–99)
Glucose-Capillary: 183 mg/dL — ABNORMAL HIGH (ref 70–99)

## 2017-09-10 LAB — COMPREHENSIVE METABOLIC PANEL
ALT: 23 U/L (ref 0–44)
AST: 27 U/L (ref 15–41)
Albumin: 2.3 g/dL — ABNORMAL LOW (ref 3.5–5.0)
Alkaline Phosphatase: 74 U/L (ref 38–126)
Anion gap: 11 (ref 5–15)
BILIRUBIN TOTAL: 1.4 mg/dL — AB (ref 0.3–1.2)
BUN: 19 mg/dL (ref 8–23)
CO2: 25 mmol/L (ref 22–32)
CREATININE: 1.19 mg/dL (ref 0.61–1.24)
Calcium: 8.8 mg/dL — ABNORMAL LOW (ref 8.9–10.3)
Chloride: 101 mmol/L (ref 98–111)
Glucose, Bld: 125 mg/dL — ABNORMAL HIGH (ref 70–99)
POTASSIUM: 3.9 mmol/L (ref 3.5–5.1)
Sodium: 137 mmol/L (ref 135–145)
TOTAL PROTEIN: 7.4 g/dL (ref 6.5–8.1)

## 2017-09-10 LAB — LACTATE DEHYDROGENASE: LDH: 192 U/L (ref 98–192)

## 2017-09-10 LAB — PROTIME-INR
INR: 1.92
Prothrombin Time: 21.8 seconds — ABNORMAL HIGH (ref 11.4–15.2)

## 2017-09-10 MED ORDER — ORAL CARE MOUTH RINSE: 15.0000 mL | Freq: Two times a day (BID) | OROMUCOSAL | 0 refills | Status: DC

## 2017-09-10 MED ORDER — TORSEMIDE 20 MG PO TABS: 60.0000 mg | ORAL_TABLET | Freq: Every day | ORAL | Status: DC

## 2017-09-10 MED ORDER — LEVALBUTEROL HCL 0.63 MG/3ML IN NEBU: 0.6300 mg | INHALATION_SOLUTION | Freq: Four times a day (QID) | RESPIRATORY_TRACT | 12 refills | Status: DC | PRN

## 2017-09-10 MED ORDER — CLONAZEPAM 0.25 MG PO TBDP
0.2500 mg | ORAL_TABLET | Freq: Two times a day (BID) | ORAL | Status: DC | PRN
  Administered 2017-09-22: 0.25 mg via ORAL
  Filled 2017-09-10: qty 1

## 2017-09-10 MED ORDER — INSULIN DETEMIR 100 UNIT/ML ~~LOC~~ SOLN: 25.0000 [IU] | Freq: Every day | SUBCUTANEOUS | 11 refills | Status: DC

## 2017-09-10 MED ORDER — ACETAMINOPHEN 325 MG PO TABS
325.0000 mg | ORAL_TABLET | ORAL | Status: DC | PRN
  Administered 2017-09-16 – 2017-09-21 (×7): 650 mg via ORAL
  Filled 2017-09-10 (×7): qty 2

## 2017-09-10 MED ORDER — POTASSIUM CHLORIDE 20 MEQ PO PACK
40.0000 meq | PACK | Freq: Every day | ORAL | Status: DC
  Administered 2017-09-11 – 2017-09-20 (×10): 40 meq via ORAL
  Filled 2017-09-10 (×10): qty 2

## 2017-09-10 MED ORDER — ALUM & MAG HYDROXIDE-SIMETH 200-200-20 MG/5ML PO SUSP: 30.0000 mL | ORAL | Status: DC | PRN

## 2017-09-10 MED ORDER — DIPHENHYDRAMINE HCL 12.5 MG/5ML PO ELIX: 12.5000 mg | ORAL_SOLUTION | Freq: Four times a day (QID) | ORAL | Status: DC | PRN

## 2017-09-10 MED ORDER — FAMOTIDINE 20 MG PO TABS
20.0000 mg | ORAL_TABLET | Freq: Two times a day (BID) | ORAL | Status: DC
  Administered 2017-09-10 – 2017-09-25 (×29): 20 mg via ORAL
  Filled 2017-09-10 (×30): qty 1

## 2017-09-10 MED ORDER — PROCHLORPERAZINE EDISYLATE 10 MG/2ML IJ SOLN: 5.0000 mg | Freq: Four times a day (QID) | INTRAMUSCULAR | Status: DC | PRN

## 2017-09-10 MED ORDER — ADULT MULTIVITAMIN W/MINERALS CH
1.0000 | ORAL_TABLET | Freq: Every day | ORAL | Status: DC
  Administered 2017-09-11 – 2017-09-25 (×15): 1 via ORAL
  Filled 2017-09-10 (×15): qty 1

## 2017-09-10 MED ORDER — INSULIN ASPART 100 UNIT/ML ~~LOC~~ SOLN
0.0000 [IU] | Freq: Three times a day (TID) | SUBCUTANEOUS | Status: DC
  Administered 2017-09-12: 3 [IU] via SUBCUTANEOUS
  Administered 2017-09-13 – 2017-09-14 (×2): 2 [IU] via SUBCUTANEOUS
  Administered 2017-09-15 – 2017-09-16 (×2): 3 [IU] via SUBCUTANEOUS
  Administered 2017-09-16 – 2017-09-17 (×2): 2 [IU] via SUBCUTANEOUS
  Administered 2017-09-17: 3 [IU] via SUBCUTANEOUS
  Administered 2017-09-18: 2 [IU] via SUBCUTANEOUS
  Administered 2017-09-18: 3 [IU] via SUBCUTANEOUS
  Administered 2017-09-19 – 2017-09-24 (×7): 2 [IU] via SUBCUTANEOUS

## 2017-09-10 MED ORDER — COLLAGENASE 250 UNIT/GM EX OINT
TOPICAL_OINTMENT | Freq: Every day | CUTANEOUS | Status: DC
  Administered 2017-09-11 – 2017-09-25 (×16): via TOPICAL
  Filled 2017-09-10 (×4): qty 30

## 2017-09-10 MED ORDER — ASPIRIN 81 MG PO CHEW
81.0000 mg | CHEWABLE_TABLET | Freq: Every day | ORAL | Status: DC
  Administered 2017-09-11 – 2017-09-25 (×15): 81 mg via ORAL
  Filled 2017-09-10 (×15): qty 1

## 2017-09-10 MED ORDER — PROCHLORPERAZINE MALEATE 5 MG PO TABS: 5.0000 mg | ORAL_TABLET | Freq: Four times a day (QID) | ORAL | Status: DC | PRN

## 2017-09-10 MED ORDER — WARFARIN SODIUM 5 MG PO TABS
5.0000 mg | ORAL_TABLET | Freq: Once | ORAL | Status: AC
  Administered 2017-09-10: 5 mg via ORAL
  Filled 2017-09-10: qty 1

## 2017-09-10 MED ORDER — INSULIN ASPART 100 UNIT/ML ~~LOC~~ SOLN: 0.0000 [IU] | Freq: Three times a day (TID) | SUBCUTANEOUS | 11 refills | Status: DC

## 2017-09-10 MED ORDER — MAGNESIUM OXIDE 400 (241.3 MG) MG PO TABS: 400.0000 mg | ORAL_TABLET | Freq: Two times a day (BID) | ORAL | Status: DC

## 2017-09-10 MED ORDER — FLEET ENEMA 7-19 GM/118ML RE ENEM: 1.0000 | ENEMA | Freq: Once | RECTAL | Status: DC | PRN

## 2017-09-10 MED ORDER — LEVALBUTEROL HCL 0.63 MG/3ML IN NEBU: 0.6300 mg | INHALATION_SOLUTION | Freq: Four times a day (QID) | RESPIRATORY_TRACT | Status: DC | PRN

## 2017-09-10 MED ORDER — SODIUM CHLORIDE 0.9% FLUSH
10.0000 mL | Freq: Two times a day (BID) | INTRAVENOUS | Status: DC
  Administered 2017-09-12: 10 mL
  Administered 2017-09-12: 20 mL
  Administered 2017-09-13 – 2017-09-14 (×2): 10 mL
  Administered 2017-09-15: 30 mL
  Administered 2017-09-15 – 2017-09-22 (×8): 10 mL

## 2017-09-10 MED ORDER — RESOURCE THICKENUP CLEAR PO POWD
ORAL | Status: DC | PRN
  Filled 2017-09-10 (×6): qty 125

## 2017-09-10 MED ORDER — POTASSIUM CHLORIDE 20 MEQ PO PACK: 40.0000 meq | PACK | Freq: Every day | ORAL | Status: DC

## 2017-09-10 MED ORDER — BISACODYL 5 MG PO TBEC: 10.0000 mg | DELAYED_RELEASE_TABLET | Freq: Every day | ORAL | 0 refills | Status: DC

## 2017-09-10 MED ORDER — LACTATED RINGERS IV SOLN: INTRAVENOUS | Status: DC | PRN

## 2017-09-10 MED ORDER — NYSTATIN 100000 UNIT/ML MT SUSP: 5.0000 mL | Freq: Four times a day (QID) | OROMUCOSAL | 0 refills | Status: DC

## 2017-09-10 MED ORDER — TRAZODONE HCL 50 MG PO TABS
25.0000 mg | ORAL_TABLET | Freq: Every evening | ORAL | Status: DC | PRN
  Filled 2017-09-10: qty 1

## 2017-09-10 MED ORDER — TORSEMIDE 20 MG PO TABS
60.0000 mg | ORAL_TABLET | Freq: Every day | ORAL | Status: DC
  Administered 2017-09-10: 60 mg via ORAL
  Filled 2017-09-10: qty 3

## 2017-09-10 MED ORDER — RESOURCE THICKENUP CLEAR PO POWD: 1.0000 | ORAL | Status: DC | PRN

## 2017-09-10 MED ORDER — SODIUM CHLORIDE 0.9% FLUSH
10.0000 mL | INTRAVENOUS | Status: DC | PRN
  Administered 2017-09-17 (×2): 10 mL
  Administered 2017-09-19: 20 mL
  Administered 2017-09-23: 10 mL
  Filled 2017-09-10 (×4): qty 40

## 2017-09-10 MED ORDER — PREMIER PROTEIN SHAKE
11.0000 [oz_av] | Freq: Three times a day (TID) | ORAL | Status: DC
  Filled 2017-09-10 (×6): qty 325.31

## 2017-09-10 MED ORDER — WARFARIN - PHARMACIST DOSING INPATIENT
Freq: Every day | Status: DC
  Administered 2017-09-10 – 2017-09-13 (×3)

## 2017-09-10 MED ORDER — WARFARIN SODIUM 5 MG PO TABS: 5.0000 mg | ORAL_TABLET | Freq: Once | ORAL | Status: DC

## 2017-09-10 MED ORDER — SPIRONOLACTONE 25 MG PO TABS: 25.0000 mg | ORAL_TABLET | Freq: Every day | ORAL | Status: DC

## 2017-09-10 MED ORDER — TORSEMIDE 20 MG PO TABS
60.0000 mg | ORAL_TABLET | Freq: Every day | ORAL | Status: DC
  Administered 2017-09-11 – 2017-09-25 (×15): 60 mg via ORAL
  Filled 2017-09-10 (×16): qty 3

## 2017-09-10 MED ORDER — INSULIN DETEMIR 100 UNIT/ML ~~LOC~~ SOLN
25.0000 [IU] | Freq: Every day | SUBCUTANEOUS | Status: DC
  Administered 2017-09-10 – 2017-09-24 (×15): 25 [IU] via SUBCUTANEOUS
  Filled 2017-09-10 (×16): qty 0.25

## 2017-09-10 MED ORDER — BISACODYL 10 MG RE SUPP: 10.0000 mg | Freq: Every day | RECTAL | Status: DC | PRN

## 2017-09-10 MED ORDER — ASPIRIN 81 MG PO CHEW: 81.0000 mg | CHEWABLE_TABLET | Freq: Every day | ORAL | Status: AC

## 2017-09-10 MED ORDER — MAGNESIUM OXIDE 400 (241.3 MG) MG PO TABS
400.0000 mg | ORAL_TABLET | Freq: Two times a day (BID) | ORAL | Status: DC
  Administered 2017-09-10 – 2017-09-25 (×30): 400 mg via ORAL
  Filled 2017-09-10 (×30): qty 1

## 2017-09-10 MED ORDER — ORAL CARE MOUTH RINSE
15.0000 mL | Freq: Two times a day (BID) | OROMUCOSAL | Status: DC
  Administered 2017-09-11 – 2017-09-24 (×24): 15 mL via OROMUCOSAL

## 2017-09-10 MED ORDER — CHLORHEXIDINE GLUCONATE CLOTH 2 % EX PADS
6.0000 | MEDICATED_PAD | Freq: Every day | CUTANEOUS | Status: DC
  Administered 2017-09-20 – 2017-09-22 (×4): 6 via TOPICAL

## 2017-09-10 MED ORDER — BISACODYL 10 MG RE SUPP
10.0000 mg | Freq: Every day | RECTAL | Status: DC
  Administered 2017-09-12: 10 mg via RECTAL
  Filled 2017-09-10 (×2): qty 1

## 2017-09-10 MED ORDER — VITAMIN C 500 MG PO TABS
500.0000 mg | ORAL_TABLET | Freq: Two times a day (BID) | ORAL | Status: DC
  Administered 2017-09-10 – 2017-09-25 (×30): 500 mg via ORAL
  Filled 2017-09-10 (×30): qty 1

## 2017-09-10 MED ORDER — INSULIN ASPART 100 UNIT/ML ~~LOC~~ SOLN: 0.0000 [IU] | Freq: Every day | SUBCUTANEOUS | 11 refills | Status: DC

## 2017-09-10 MED ORDER — ONDANSETRON HCL 4 MG/2ML IJ SOLN: 4.0000 mg | Freq: Four times a day (QID) | INTRAMUSCULAR | 0 refills | Status: DC | PRN

## 2017-09-10 MED ORDER — BISACODYL 10 MG RE SUPP: 10.0000 mg | Freq: Every day | RECTAL | 0 refills | Status: DC

## 2017-09-10 MED ORDER — COLLAGENASE 250 UNIT/GM EX OINT: TOPICAL_OINTMENT | Freq: Every day | CUTANEOUS | 0 refills | Status: DC

## 2017-09-10 MED ORDER — OXYCODONE HCL 5 MG PO TABS
5.0000 mg | ORAL_TABLET | ORAL | Status: DC | PRN
  Administered 2017-09-11: 5 mg via ORAL
  Filled 2017-09-10: qty 1

## 2017-09-10 MED ORDER — ENSURE ENLIVE PO LIQD: 237.0000 mL | Freq: Two times a day (BID) | ORAL | 12 refills | Status: DC

## 2017-09-10 MED ORDER — POLYETHYLENE GLYCOL 3350 17 G PO PACK
17.0000 g | PACK | Freq: Every day | ORAL | Status: DC | PRN
Start: 2017-09-10 — End: 2017-09-25
  Administered 2017-09-22: 17 g via ORAL
  Filled 2017-09-10 (×2): qty 1

## 2017-09-10 MED ORDER — BISACODYL 5 MG PO TBEC
10.0000 mg | DELAYED_RELEASE_TABLET | Freq: Every day | ORAL | Status: DC
  Administered 2017-09-11 – 2017-09-25 (×10): 10 mg via ORAL
  Filled 2017-09-10 (×15): qty 2

## 2017-09-10 MED ORDER — ACETAMINOPHEN 160 MG/5ML PO SOLN: 650.0000 mg | ORAL | Status: DC | PRN

## 2017-09-10 MED ORDER — NYSTATIN 100000 UNIT/ML MT SUSP
5.0000 mL | Freq: Four times a day (QID) | OROMUCOSAL | Status: DC
  Administered 2017-09-10 – 2017-09-25 (×41): 500000 [IU] via ORAL
  Filled 2017-09-10 (×47): qty 5

## 2017-09-10 MED ORDER — PROCHLORPERAZINE 25 MG RE SUPP: 12.5000 mg | Freq: Four times a day (QID) | RECTAL | Status: DC | PRN

## 2017-09-10 MED ORDER — INSULIN ASPART 100 UNIT/ML ~~LOC~~ SOLN: 0.0000 [IU] | Freq: Every day | SUBCUTANEOUS | Status: DC

## 2017-09-10 MED ORDER — SPIRONOLACTONE 25 MG PO TABS
25.0000 mg | ORAL_TABLET | Freq: Every day | ORAL | Status: DC
  Administered 2017-09-11 – 2017-09-25 (×15): 25 mg via ORAL
  Filled 2017-09-10 (×15): qty 1

## 2017-09-10 MED ORDER — FAMOTIDINE 20 MG PO TABS: 20.0000 mg | ORAL_TABLET | Freq: Two times a day (BID) | ORAL | Status: DC

## 2017-09-10 MED ORDER — GUAIFENESIN-DM 100-10 MG/5ML PO SYRP: 5.0000 mL | ORAL_SOLUTION | Freq: Four times a day (QID) | ORAL | Status: DC | PRN

## 2017-09-10 NOTE — Progress Notes (Signed)
PT Cancellation Note  Patient Details Name: [name] [name] MRN: [id number] DOB: [birth year]   Cancelled Treatment:    Reason Eval/Treat Not Completed: Patient declined, no reason specified (attempted 2 additional times today 1015 pt completed hydrotherapy and reported increased pain. Attempted at 1238 pt sleeping and declined any mobility at this time.)   Joeziah Voit B Shandale Malak 09/10/2017, 12:41 PM Elwyn Reach, [place]

## 2017-09-10 NOTE — Progress Notes (Addendum)
Patient ID: [name] [name], male   DOB: [birth year], 67 y.o.   MRN: [id number]     Advanced Heart Failure Rounding Note  PCP-Cardiologist: No primary care provider on file.   Subjective:    Events 6/17 Underwent HM-3 implant -> Unable to close chest with RV failure and high intrathoracic pressure 6/18 Taken back to OR for evacuation of hematoma. NO RVAD needed. Left sternum open  08/19/17 Taken back to OR for sternal closure 6/22 VT => amiodarone started.  6/24 VT/VF=>urgent cardioversion. 6/25 VT  =>urgent cardioversion  6/27 Extubated  Weight down another pound.   Denies SoB. Wants to go to rehab.      LVAD Interrogation HM 3: Speed: 5300 Flow: 4 PI: 4.2 Power: 4 No PI events. VAD interrogated personally. Parameters stable.   Objective:   Weight Range: 215 lb 8 oz (97.8 kg) Body mass index is 30.06 kg/m.   Vital Signs:   Temp:  [98 F (36.7 C)-98.5 F (36.9 C)] 98.3 F (36.8 C) (07/12 0307) Pulse Rate:  [56-79] 76 (07/12 0307) Resp:  [17-24] 17 (07/12 0307) BP: (88-110)/(64-89) 97/79 (07/12 0400) SpO2:  [92 %-99 %] 93 % (07/12 0307) Weight:  [215 lb 8 oz (97.8 kg)] 215 lb 8 oz (97.8 kg) (07/12 0625) Last BM Date: 09/09/17  Weight change: Filed Weights   09/08/17 0440 09/09/17 0647 09/10/17 0625  Weight: 221 lb 9 oz (100.5 kg) 216 lb 7.9 oz (98.2 kg) 215 lb 8 oz (97.8 kg)   Intake/Output:   Intake/Output Summary (Last 24 hours) at 09/10/2017 0745 Last data filed at 09/10/2017 0308 Gross per 24 hour  Intake 200 ml  Output 1000 ml  Net -800 ml   Physical Exam    Physical Exam: GENERAL: Sitting on the side of the bed. NAD.  HEENT: normal  NECK: Supple, JVP 8-9 .  2+ bilaterally, no bruits.  No lymphadenopathy or thyromegaly appreciated.   CARDIAC:  Mechanical heart sounds with LVAD hum present.  LUNGS:  Clear to auscultation bilaterally. Room Air ABDOMEN:  Soft, round, nontender, positive bowel sounds x4.     LVAD exit site:   Dressing dry and intact.   No erythema or drainage.  Stabilization device present and accurately applied.  Driveline dressing is being changed daily per sterile technique. EXTREMITIES:  Warm and dry, no cyanosis, clubbing, rash or edema. RUE PICC   NEUROLOGIC:  Alert and oriented x 4.  Gait steady.  No aphasia.  No dysarthria.  Affect pleasant.      Telemetry   A fib 70s personally reviewed.  Labs    CBC Recent Labs    09/09/17 0500 09/10/17 0500  WBC 6.6 6.0  HGB 9.1* 9.2*  HCT 30.6* 30.1*  MCV 85.0 83.6  PLT 326 086   Basic Metabolic Panel Recent Labs    09/09/17 0500 09/10/17 0500  NA 140 137  K 4.1 3.9  CL 107 101  CO2 24 25  GLUCOSE 119* 125*  BUN 21 19  CREATININE 1.11 1.19  CALCIUM 8.9 8.8*  MG 1.9 2.0   Liver Function Tests Recent Labs    09/09/17 0500 09/10/17 0500  AST 25 27  ALT 24 23  ALKPHOS 75 74  BILITOT 1.1 1.4*  PROT 7.4 7.4  ALBUMIN 2.4* 2.3*   No results for input(s): LIPASE, AMYLASE in the last 72 hours. Cardiac Enzymes No results for input(s): CKTOTAL, CKMB, CKMBINDEX, TROPONINI in the last 72 hours.  BNP: BNP (last 3 results) Recent Labs  08/23/17 0024 08/30/17 0000 09/05/17 2327  BNP 450.5* 114.6* 86.1    ProBNP (last 3 results) Recent Labs    11/12/16 1559  PROBNP 388.0*     D-Dimer No results for input(s): DDIMER in the last 72 hours. Hemoglobin A1C No results for input(s): HGBA1C in the last 72 hours. Fasting Lipid Panel No results for input(s): CHOL, HDL, LDLCALC, TRIG, CHOLHDL, LDLDIRECT in the last 72 hours. Thyroid Function Tests No results for input(s): TSH, T4TOTAL, T3FREE, THYROIDAB in the last 72 hours.  Invalid input(s): FREET3  Other results:   Imaging    No results found.   Medications:     Scheduled Medications: . aspirin  81 mg Oral Daily  . bisacodyl  10 mg Oral Daily   Or  . bisacodyl  10 mg Rectal Daily  . Chlorhexidine Gluconate Cloth  6 each Topical QHS  . collagenase   Topical Daily  . famotidine   20 mg Oral BID  . feeding supplement (ENSURE ENLIVE)  237 mL Oral BID BM  . insulin aspart  0-15 Units Subcutaneous TID WC  . insulin aspart  0-5 Units Subcutaneous QHS  . insulin detemir  25 Units Subcutaneous QHS  . magnesium oxide  400 mg Oral BID  . mouth rinse  15 mL Mouth Rinse BID  . nystatin  5 mL Oral QID  . potassium chloride  40 mEq Oral Daily  . sodium chloride flush  10-40 mL Intracatheter Q12H  . spironolactone  25 mg Oral Daily  . Warfarin - Pharmacist Dosing Inpatient   Does not apply q1800    Infusions: . sodium chloride 10 mL/hr at 09/06/17 1031  . lactated ringers Stopped (08/19/17 1602)    PRN Medications: acetaminophen (TYLENOL) oral liquid 160 mg/5 mL, clonazepam, hydrALAZINE, lactated ringers, levalbuterol, ondansetron (ZOFRAN) IV, oxyCODONE, RESOURCE THICKENUP CLEAR, sodium chloride flush    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   Directly admitted with persistent low cardiac output for milrinone initiation for home.  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 Acute/Chronic systolic CHF with biventricular failure-> cardiogenic shock: Echo  08/13/2017 EF 20-25%. s/p  Medtronic BiV ICD in place. Cath 12/18 with stable 1v CAD.  s/p HM-3 implant 6/17. Unable to close chest due to high-intrathoracic pressures and RV failure. Taken back to OR 08/17/17 with evacuation of hematoma with improvement. Extubated 6/28. Ramp ECHO 7/11 with speed increased to 5300.  -Volume status stable. Start torsemide 60 mg daily today. .   -Continue spiro 25 mg daily.  -Renal function stable.  2. 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  LDH 192  Ramp ECHO speed increased to 5300.   - VAD interrogated personally. Parameters stable. - Continue warfarin. -INR 1.9. Discussed with pharmacy.  - Continue asa 81 mg  daily.  3. Acute respiratory failure post-op:  On room air.  4. 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 [name]/VF: Recurrent VT on 6/22.  S/P VF 6/24 emergent bedside cardioversion. S/P VT 6/26 emergent bedside cardioversion. ICD turned back on today.  Resolved.  Continue mag 400 mg po bid.  - Keep K > 4.0 Mg > 2.0.   6. AFL/atrial fibrillation: S/p previous ablation. He is currently in atrial fibrillation.  - Rate controlled. Amio  stopped.  - on coumadin.  INR 1.9  7. [name]: Recent cath with stable CAD as above: No chest pain.  8. [name]: Recent A1c 8.3 on 6/3.  -Blood sugars better controlled.  -continue current regimen.  9. Anemia: Post-op.  Got 1 unit PRBCs 6/22 and 6/25. - Stable. Hgb 9.2  10. Fevers: Afebrile. Cx negative. Has completed meropenem.  11. Deconditioning - Severe. PT following.  12. Dysphagia: Failed repeat swallow study 7/5. Remains dysphagia-2 diet. Speech Therapy following closely.  13. Hypernatremia: In setting of dysphagia 2 diet with thickened liquids so access to free water is reduced.  -Resolved.  14. Unstageable Pressure Ulcer: R and L buttock.  R buttock with necrosis noted. Continue santyl for selective debridement. Change daily.  Continue air overlay. Needs to reposition every hour when in the chair. Keep R /L in bed. WOC has seen. May need a different chair cushion. Add hydrotherapy.   To CIR today. Medtronic to turn on device today.   Length of Stay: [place], NP  09/10/2017, 7:45 AM  Advanced Heart Failure Team Pager [phone] (M-F; [place])  Please contact [facility] for night-coverage after hours (4p -7a ) and weekends on [email or web address]  Patient seen with NP, agree with the above note.    No complaints, walking with PT.   On exam, no JVD.  Trace ankle edema.  Normal LVAD sounds.  Lungs clear anteriorly.   Volume looks better today, starting torsemide 60 mg daily.   Medtronic has turned his ICD back on.   INR  1.9, adjusting warfarin.    OK for CIR today.   LVAD parameters stable.   Loralie Champagne 09/10/2017 2:11 PM

## 2017-09-10 NOTE — Care Management Note (Signed)
Case Management Note Previous CM note completed by Maryclare Labrador, RN 09/06/2017, 9:47 AM   Patient Details  Name: [name] [name] MRN: [id number] Date of Birth: 01-17-1951  Subjective/Objective:   Pt admitted for HF - will need home milrinone               Action/Plan:  PTA independent from home with wife.  Pt states he has PCP and denied barriers with obtaining/paying for medications.  Pt informed CM that he weights daily and adheres to a low salt diet.  CM gave [facility] choice for infusion - [facility] agreed to by pt and HF team, agency contacted and tentative referral given.  CM will continue to follow   Expected Discharge Date:  09/10/17               Expected Discharge Plan:  [place]  In-House Referral:  Clinical Social Work  Discharge planning Services  CM Consult  Post Acute Care Choice:  IP Rehab Choice offered to:     DME Arranged:    DME Agency:     HH Arranged:    [place] Agency:     Status of Service:  Completed, signed off  If discussed at [employer] [facility] [employer], dates discussed:    Discharge Disposition: IP Rehab   Additional Comments:   09/10/17- 1040- Deaunna Olarte RN, CM- confirmed with Claiborne Billings at [employer]- pt has insurance auth and bed available for CIR today- pt for discharge today to IP rehab. Transition later this afternoon.   Maryclare Labrador, RN 09/06/2017, 9:47 AM Pt now on progressive unit.  Pt remains on Milrinone.  Discharge plan will be CIR or home as SNF will not admit new LVAD pt.  08/17/17 Pt is now s/p LVAD.  Unfortunately had to return to the OR this am for evacuation of hematoma.  Pt remains on ventilator. CM will continue to follow for discharge needs  08/13/17 Pt will now receive LVAD prior to discharge - will not discharge home on Milrinone  Dahlia Client Romeo Rabon, RN 09/10/2017, 10:39 AM 2C cross coverage [phone]

## 2017-09-10 NOTE — Care Management Important Message (Signed)
Important Message  Patient Details  Name: [name] [name] MRN: [id number] Date of Birth: 04/05/1950   Medicare Important Message Given:  Yes    [place] 09/10/2017, 3:21 PM

## 2017-09-10 NOTE — Progress Notes (Signed)
LVAD Coordinator Rounding Note  Admitted 08/11/17 for initiation of Milrinone. 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 He was evaluated [facility] /Dr Posey Pronto on 5/9 for possible heart transplant.   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 Prescott Gum under Destination Therapy criteria. Dr [name] discussed heart transplant candidacy with Dr Posey Pronto. Given size and blood type there was concern he would not make it to transplant.   Pt sitting on the side of the bed waiting for breakfast.   Vital signs: Temp:  98 HR: 70 afib Auto BP:  98/80(86) Doppler:  78 O2 Sat:  100 % on RA Wt: 238.....260>239>227>225>229>229>227>222>228>225>222>221>216>215 lbs   LVAD interrogation reveals:  Speed: 5200 Flow: 3.2 Power: 3.7w PI:  7.0 Alarms: none  Events: none Hematocrit:  30 Fixed speed: 5200 Low speed limit: 4900  Drive Line:  Left abd dressing dry and intact; anchors intact and accurately applied. Wife will change the dressing today.  Labs:  LDH trend: 274......324>384>425>423>397>267>306>239>245>236>223>205>216>205>197  INR trend:  1.71.......2.54>2.16>2.63>2.87>2.79>2.67>2.61>2.65>2.58>2.06>1.89>1.96>1.97>1.92  Anticoagulation Plan: -INR Goal: 2.0 - 2.5 -ASA Dose: 81 mg daily   Blood Products:  Intra op: - 08/16/17> one platelet; 2 FFP - 08/17/17> ome FFP  Post op: - 08/16/17 > one unit PCs - 08/21/17 > one unit PCs - 08/24/17 > one unit PCs  Device:  - BiV Medtronic - Therapies: off  Respiratory: extubated 08/27/17  Nitric Oxide: off 08/21/17  Gtts: - Epi 1 mcg/min - stopped 08/31/17 - Milrinone 0.125 mcg/kg/min - off 09/08/17  Adverse Events on VAD: - 08/17/17>mediastinal reexploration with evacuation of mediastinal clot - 08/19/17>sternal closure  VAD education:  1. Caregiver is changing drive line dressing independently.  2.VAD discharge education with patient and family completed on 09/09/17.  Plan/Recommendations:  1. Daily dressing changes  per VAD coordinator, nurse champion, or trained caregiver. 2. Page VAD Coordinator with any VAD equipment or drive line issues.  3. OK to transfer to CIR today.  Tanda Rockers RN, VAD Coordinator 24/7 VAD Pager: [phone]

## 2017-09-10 NOTE — Progress Notes (Signed)
[place] for coumadin Indication: LVAD  Allergies  Allergen Reactions  . Penicillins Hives, Itching and Other (See Comments)    Has patient had a PCN reaction causing immediate rash, facial/tongue/throat swelling, SOB or lightheadedness with hypotension:# # Yes # # Has patient had a PCN reaction causing severe rash involving mucus membranes or skin necrosis: No Has patient had a PCN reaction that required hospitalization: No Has patient had a PCN reaction occurring within the last 10 years: No If all of the above answers are "NO", then may proceed with Cephalosporin use.     Patient Measurements: Height: 5\' 11"  (180.3 cm) Weight: 215 lb 8 oz (97.8 kg) IBW/kg (Calculated) : 75.3 Heparin Dosing Weight: 98.3 kg  Vital Signs: Temp: 98 F (36.7 C) (07/12 0754) Temp Source: Oral (07/12 0754) BP: 98/80 (07/12 0754) Pulse Rate: 70 (07/12 0754)  Labs: Recent Labs    09/08/17 0449 09/09/17 0500 09/10/17 0500  HGB 9.0* 9.1* 9.2*  HCT 29.2* 30.6* 30.1*  PLT 285 326 274  LABPROT 22.2* 22.2* 21.8*  INR 1.96 1.97 1.92  CREATININE 1.18 1.11 1.19    Estimated Creatinine Clearance: 71.8 mL/min (by C-G formula based on SCr of 1.19 mg/dL).   Medical History: Past Medical History:  Diagnosis Date  . AICD (automatic cardioverter/defibrillator) present 02/05/2014   Upgrade to Medtronic biventricular ICD, serial number  [id number]   . Atrial flutter ([place]) 04/2012   s/p TEE-EPS+RFCA 04/2012  . CAD (coronary artery disease) 6122,4497 X 2    RCA-T, 70% PL (off CFX), 99% Prox LAD/90% Dist LAD, S/P TAXUS stent x 2  . CHF (congestive heart failure) ([place])   . Chronic anticoagulation   . Chronic systolic heart failure ([place])   . CKD (chronic kidney disease)   . Diabetic retinopathy ([place])   . DM type 2 (diabetes mellitus, type 2) (HCC)    insulin dependent  . HTN (hypertension)   . Hypercholesteremia    ablation  . ICD (implantable cardiac  defibrillator) in place   . Ischemic cardiomyopathy March 2015   20-25% 2D   . Nephrolithiasis   . Ventricular tachycardia ([place])     Assessment: 36 yom who underwent LVAD placement on 6/17.  INR holding stable but low at 1.9. Hgb 9, plt 274, LDH stable. No s/sx of bleeding documented.  Goal of Therapy:  INR goal 2-2.5 Monitor platelets by anticoagulation protocol: Yes   Plan:   Warfarin 5 mg tonight. Monitor s/s bleeding Will provide warfarin education prior to discharge  Erin Hearing PharmD., BCPS Clinical Pharmacist 09/10/2017 8:03 AM

## 2017-09-10 NOTE — Progress Notes (Signed)
PMR Admission Coordinator Pre-Admission Assessment  Patient: [name] [name] is an 67 y.o., male MRN: [id number] DOB: [birth year] Height: _0  (180.3 cm) Weight: 97.8 kg (215 lb 8 oz)                                                                                                                                                  Insurance Information HMO:     PPO: Yes     PCP:      IPA:      80/20:      OTHER:  PRIMARY: [employer]      Policy#: [id number]      Subscriber: Patient CM Name: Andreas Newport      Phone#: [phone]     Fax#: [phone] Clinicals updates are due on day 7 (admit date 09/10/17; day 7 is 09/17/17) to Andreas Newport.  Pre-Cert#: Auth # [id number]      Employer:  Benefits:  Phone #: [phone]     Name: Online Portal  Eff. Date: 03/02/2017     Deduct: $0      Out of Pocket Max: $3,400 (met $595)      Life Max: NA CIR: $295/day for days 1-6; $0/day for days 7+      SNF: $20/day for days 1-20; $160/day for days 21-100 (SNF day limit: 100 days) Outpatient: per necessity     Co-Pay: $15/visit Home Health: per necessity; 100%      Co-Pay:  DME: 80%     Co-Pay: 20% Providers:   Medicaid Application Date:       Case Manager:  Disability Application Date:       Case Worker:   Emergency Publishing copy Information    Name Relation Home Work [place] Spouse [phone]     Current Medical History  Patient Admitting Diagnosis: Debility History of Present Illness: MYRTLE BARNHARD a 67 y.o.malewith past medical history of ventricular tachycardia, ischemic cardiomyopathy status post ICD, hypertension, diabetes mellitus type 2 with retinopathy, CKD, chronic systolic congestive heart failure, CAD, atrial flutter presented on 08/11/2017 due to CHF. History taken from chart review. Patient was admitted with plans for home milrinone initiation. He has noted to be fluid overloaded and ultimately  had an LVAD placed on 6/17. Hospital course complicated by cardiogenic shock (requiring pressors),respiratory failure, V. tach/V. Fib, tachypnea, leukocytosis, acute blood loss anemia.Mirinone stopped on 7/10. Pt currently has an unstageable pressure ulcer on R and L buttock with necrosis on R buttock. Hydrotherapy has been ordered. Therapies have seen pt with recommendations for CIR. Pt is to be admitted to CIR on 09/10/17.   Past Medical History      Past Medical History:  Diagnosis Date  . AICD (automatic cardioverter/defibrillator) present 02/05/2014   Upgrade to Medtronic  biventricular ICD, serial number  [id number] [id number]   . Atrial flutter ([place]) 04/2012   s/p TEE-EPS+RFCA 04/2012  . CAD (coronary artery disease) 3007,6226 X 2    RCA-T, 70% PL (off CFX), 99% Prox LAD/90% Dist LAD, S/P TAXUS stent x 2  . CHF (congestive heart failure) ([place])   . Chronic anticoagulation   . Chronic systolic heart failure ([place])   . CKD (chronic kidney disease)   . Diabetic retinopathy ([place])   . DM type 2 (diabetes mellitus, type 2) (HCC)    insulin dependent  . HTN (hypertension)   . Hypercholesteremia    ablation  . ICD (implantable cardiac defibrillator) in place   . Ischemic cardiomyopathy March 2015   20-25% 2D   . Nephrolithiasis   . Ventricular tachycardia ([place])     Family History  family history includes Alzheimer's disease in his mother; CAD in his brother, brother, brother, and brother; Diabetes in his brother; Healthy in his daughter and son; Heart attack in his mother; Heart failure in his father; Other in his brother; Prostate cancer in his brother.  Prior Rehab/Hospitalizations:  Has the patient had major surgery during 100 days prior to admission? Yes, cardiac cath  Current Medications   Current Facility-Administered Medications:  .  0.9 %  sodium chloride infusion, , Intravenous, Continuous, Prescott Gum, Collier Salina, MD, Last Rate: 10 mL/hr at 09/06/17 1031 .   acetaminophen (TYLENOL) solution 650 mg, 650 mg, Per Tube, Q4H PRN, Bensimhon, Shaune Pascal, MD .  aspirin chewable tablet 81 mg, 81 mg, Oral, Daily, Clegg, Amy D, NP, 81 mg at 09/09/17 0909 .  bisacodyl (DULCOLAX) EC tablet 10 mg, 10 mg, Oral, Daily, 10 mg at 09/08/17 1042 **OR** bisacodyl (DULCOLAX) suppository 10 mg, 10 mg, Rectal, Daily, Prescott Gum, Collier Salina, MD, 10 mg at 09/04/17 1132 .  Chlorhexidine Gluconate Cloth 2 % PADS 6 each, 6 each, Topical, QHS, Bensimhon, Shaune Pascal, MD, 6 each at 09/09/17 2139 .  clonazePAM (KLONOPIN) disintegrating tablet 0.25 mg, 0.25 mg, Oral, BID PRN, Bensimhon, Shaune Pascal, MD, 0.25 mg at 08/31/17 0137 .  collagenase (SANTYL) ointment, , Topical, Daily, Prescott Gum, Collier Salina, MD .  famotidine (PEPCID) tablet 20 mg, 20 mg, Oral, BID, Bensimhon, Shaune Pascal, MD, 20 mg at 09/09/17 2138 .  feeding supplement (ENSURE ENLIVE) (ENSURE ENLIVE) liquid 237 mL, 237 mL, Oral, BID BM, Bensimhon, Shaune Pascal, MD, 237 mL at 09/09/17 1730 .  hydrALAZINE (APRESOLINE) injection 10 mg, 10 mg, Intravenous, Q4H PRN, Bensimhon, Shaune Pascal, MD, 10 mg at 09/01/17 0424 .  insulin aspart (novoLOG) injection 0-15 Units, 0-15 Units, Subcutaneous, TID WC, Clegg, Amy D, NP, 2 Units at 09/09/17 1730 .  insulin aspart (novoLOG) injection 0-5 Units, 0-5 Units, Subcutaneous, QHS, Clegg, Amy D, NP .  insulin detemir (LEVEMIR) injection 25 Units, 25 Units, Subcutaneous, QHS, Clegg, Amy D, NP, 25 Units at 09/09/17 2137 .  lactated ringers infusion, , Intravenous, Continuous PRN, Prescott Gum, Collier Salina, MD, Stopped at 08/19/17 1602 .  levalbuterol (XOPENEX) nebulizer solution 0.63 mg, 0.63 mg, Nebulization, Q6H PRN, Bensimhon, Shaune Pascal, MD .  magnesium oxide (MAG-OX) tablet 400 mg, 400 mg, Oral, BID, Clegg, Amy D, NP, 400 mg at 09/09/17 2138 .  MEDLINE mouth rinse, 15 mL, Mouth Rinse, BID, Bensimhon, Shaune Pascal, MD, 15 mL at 09/09/17 2139 .  nystatin (MYCOSTATIN) 100000 UNIT/ML suspension 500,000 Units, 5 mL, Oral, QID, Skeet Latch, MD, 500,000 Units at 09/09/17 2138 .  ondansetron (ZOFRAN) injection 4 mg, 4 mg, Intravenous,  Q6H PRN, Prescott Gum, Collier Salina, MD, 4 mg at 08/27/17 4650 .  oxyCODONE (Oxy IR/ROXICODONE) immediate release tablet 5 mg, 5 mg, Oral, Q6H PRN, Prescott Gum, Collier Salina, MD, 5 mg at 09/10/17 0813 .  potassium chloride (KLOR-CON) packet 40 mEq, 40 mEq, Oral, Daily, Clegg, Amy D, NP, 40 mEq at 09/09/17 1019 .  RESOURCE THICKENUP CLEAR, , Oral, PRN, Magdalen Spatz, NP .  sodium chloride flush (NS) 0.9 % injection 10-40 mL, 10-40 mL, Intracatheter, Q12H, Prescott Gum, Collier Salina, MD, 20 mL at 09/10/17 0538 .  sodium chloride flush (NS) 0.9 % injection 10-40 mL, 10-40 mL, Intracatheter, PRN, Prescott Gum, Collier Salina, MD .  spironolactone (ALDACTONE) tablet 25 mg, 25 mg, Oral, Daily, Bensimhon, Shaune Pascal, MD, 25 mg at 09/09/17 0909 .  warfarin (COUMADIN) tablet 5 mg, 5 mg, Oral, ONCE-1800, Lyndee Leo, RPH .  Warfarin - Pharmacist Dosing Inpatient, , Does not apply, q1800, Bensimhon, Shaune Pascal, MD  Patients Current Diet:       Diet Order           Diet - low sodium heart healthy        DIET DYS 2 Room service appropriate? Yes; Fluid consistency: Honey Thick  Diet effective now          Precautions / Restrictions Precautions Precautions: Sternal, Fall Precaution Comments: LVAD, sacral wound Restrictions Weight Bearing Restrictions: Yes   Has the patient had 2 or more falls or a fall with injury in the past year?Yes, broke ankle this past January   Prior Activity Level Community (5-7x/wk): Drives, semi-retired  Development worker, international aid / [place] Devices/Equipment: Blood pressure cuff, CBG Meter Home Equipment: Shower seat  Prior Device Use: Indicate devices/aids used by the patient prior to current illness, exacerbation or injury? None of the above  Prior Functional Level Prior Function Level of Independence: Independent Comments: drives, cooks, works as a Psychologist, occupational  Care: Did the patient need help bathing, dressing, using the toilet or eating?  Independent  Indoor Mobility: Did the patient need assistance with walking from room to room (with or without device)? Independent  Stairs: Did the patient need assistance with internal or external stairs (with or without device)? Independent  Functional Cognition: Did the patient need help planning regular tasks such as shopping or remembering to take medications? Independent  Current Functional Level Cognition  Overall Cognitive Status: Impaired/Different from baseline Current Attention Level: Alternating Orientation Level: Oriented X4 Following Commands: Follows one step commands consistently Safety/Judgement: Decreased awareness of deficits General Comments: pt able to recall only 1 precaution, educateed for all, able to process power transition and after mod assist for initial transition to battery only required min cues to return to main power    Extremity Assessment (includes Sensation/Coordination)  Upper Extremity Assessment: Generalized weakness  Lower Extremity Assessment: Defer to PT evaluation    ADLs  Overall ADL's : Needs assistance/impaired Eating/Feeding: Minimal assistance, Sitting Eating/Feeding Details (indicate cue type and reason): able to drink from cup and use spoon with magic cup with cues for small bite size and to tuck his chin with swallowing Grooming: Sitting, Set up(blew nose) Upper Body Bathing: With caregiver independent assisting, Sitting, Bed level Lower Body Bathing: Total assistance, Bed level Upper Body Dressing : Total assistance, Sitting Lower Body Dressing: Total assistance, Bed level Toilet Transfer: Minimal assistance, +2 for physical assistance, Ambulation, RW Toilet Transfer Details (indicate cue type and reason): Mod A +2 sit<>stand, min A to ambulate wiht RW Toileting-  Clothing Manipulation and Hygiene: Total assistance Toileting - Clothing  Manipulation Details (indicate cue type and reason): min A +2 sit<>stand Functional mobility during ADLs: Total assistance, +2 for physical assistance, Maximal assistance General ADL Comments: Pt able to transition to/from battery/wall power with Max cues and min A for correct placement of connection ("half moons together) as well as increased time.    Mobility  Overal bed mobility: Needs Assistance Bed Mobility: Rolling, Sidelying to Sit Rolling: Min assist Sidelying to sit: Min assist Supine to sit: Mod assist, +2 for physical assistance Sit to sidelying: Min assist General bed mobility comments: cues for sequence with assist to elevate trunk    Transfers  Overall transfer level: Needs assistance Equipment used: 2 person hand held assist(Eva walker) Transfer via Lift Equipment: [place] Transfers: Sit to/from Stand Sit to Stand: Mod assist Stand pivot transfers: Mod assist General transfer comment: mod assist for anterior translation and rise to stand from bed and chair x 3 trials with cues for hands on thighs, min assist to control descent     Ambulation / Gait / Stairs / Wheelchair Mobility  Ambulation/Gait Ambulation/Gait assistance: Min assist, +2 safety/equipment Gait Distance (Feet): 85 Feet Assistive device: Rolling walker (2 wheeled) Gait Pattern/deviations: Step-through pattern, Decreased stride length, Trunk flexed General Gait Details: cues for posture, looking up and increased stride with encouragement to advance distance. Pt walked 85' then 22' with seated rest between trials grossly 2 min Gait velocity: decreased Gait velocity interpretation: <1.31 ft/sec, indicative of household ambulator    Posture / Balance Dynamic Sitting Balance Sitting balance - Comments: supervision EOB Balance Overall balance assessment: Needs assistance Sitting-balance support: No upper extremity supported Sitting balance-Leahy Scale: Good Sitting balance - Comments: supervision  EOB Postural control: Posterior lean, Right lateral lean Standing balance support: Bilateral upper extremity supported Standing balance-Leahy Scale: Poor Standing balance comment: bil UE support in standing    Special needs/care consideration BiPAP/CPAP: No CPM: No Continuous Drip IV: No Dialysis: No        Days Life Vest: Pt has a newly placed LVAD Oxygen: No, Room Air Special Bed: air overlay for sacral wound Trach Size: No Wound Vac (area): no      Location NA Skin: Unstagable pressure ulcer to R and L buttock (necrosis noted on R buttock); needs for repositioning hourly when in chair (may need different chair cushion). Catheter entry and exit on abdomen; skin tear along L upper lateral back                     Location Bowel mgmt: Last BM 09/09/17 Bladder mgmt:: continent, using urinal at times; condom cath at times Diabetic mgmt: yes, wife reports use of oral medicine and injections     Previous Home Environment Living Arrangements: Spouse/significant other Type of Home: House Bathroom Toilet: [place]: Yes Type of Home Care Services: Other (Comment)(Paramedicine program w/HF clinic)  Discharge Living Setting Plans for Discharge Living Setting: Patient's home, Lives with (comment)(lives with wife and son lives there periodically) Type of Home at Discharge: House Discharge Home Layout: One level Discharge Home Access: Stairs to enter Entrance Stairs-Rails: None Entrance Stairs-Number of Steps: 3 Discharge Bathroom Shower/Tub: Tub/shower unit(with shower stool; [place] shower, and 1 grab bar) Discharge Bathroom Toilet: Handicapped height Discharge Bathroom Accessibility: Yes How Accessible: Accessible via walker Does the patient have any problems obtaining your medications?: Yes (Describe)(was in donut hole when purching Eliquis)  Social/Family/Support Systems Patient Roles: Spouse, Parent Contact Information: wife  is emergency contact Anticipated  Caregiver: wife (as wlel as children-1 lives in [place]; [place]) Anticipated Caregiver's Contact Information: (Wife: [name]) cell: [phone] Home: [phone] Ability/Limitations of Caregiver: wife can provide up to Mod A based on her perception of what Mod A is from [facility] description  Caregiver Availability: 24/7 Discharge Plan Discussed with Primary Caregiver: Yes Is Caregiver In Agreement with Plan?: Yes Does Caregiver/Family have Issues with Lodging/Transportation while Pt is in Rehab?: No   Goals/Additional Needs Patient/Family Goal for Rehab: PT/OT: Min/Mod A; SLP: Supervision Expected length of stay: 18-22 days Cultural Considerations: NA Dietary Needs: DYS 2; Honey thick liquids Equipment Needs: TBD Special Service Needs: LVAD care Additional Information: therpay and nursing to assist with management of new LVAD Pt/Family Agrees to Admission and willing to participate: Yes(pt, wife, daughter) Program Orientation Provided & Reviewed with Pt/Caregiver Including Roles  & Responsibilities: Yes(reviewed with pt, wife, and daughter) Additional Information Needs: TBD(TBD)  Barriers to Discharge: Medical stability, Home environment access/layout, Other (comments)  Barriers to Discharge Comments: new LVAD; will need training; family training for transfer techniques   Decrease burden of Care through IP rehab admission: NA   Possible need for SNF placement upon discharge:Not anticipated; Wife feels she can be the 24/7 A in addition to family and friends support; Wife has understanding of anticipated level of assist required at DC.    Patient Condition: This patient's medical and functional status has changed since the consult dated: 08/29/17 in which the Rehabilitation Physician determined and documented that the patient's condition is appropriate for intensive rehabilitative care in an inpatient rehabilitation facility. See "History of Present Illness" (above) for medical  update. Functional changes are: Mod A sit to stand transfers, Min A x2 for ambulation 85 feet. Patient's medical and functional status update has been discussed with the Rehabilitation physician and patient remains appropriate for inpatient rehabilitation. Will admit to inpatient rehab today.  Preadmission Screen Completed By:  Jhonnie Garner, 09/10/2017 10:02 AM ______________________________________________________________________   Discussed status with Dr. Naaman Plummer on 09/10/17 at 10:00AM  and received telephone approval for admission today.  Admission Coordinator:  Jhonnie Garner, time 10:00AM/Date 09/10/17             Cosigned by: Meredith Staggers, MD at 09/10/2017 11:33 AM  Revision History

## 2017-09-10 NOTE — H&P (Signed)
Physical Medicine and Rehabilitation Admission H&P  CC: Debility  HPI: [name] [name] is a 67 year old male with past medical history of ventricular tachycardia, ischemic cardiomyopathy status post ICD, hypertension, type 2 diabetes mellitus with retinopathy, CKD, chronic systolic heart failure, CAD, a flutter who presented on 08/11/2017 due to CHF. He was admitted with plans for home milrinone initiation however noted to be fluid overloaded and ultimately had LVAD placed on 6/17 with hematoma evacuation on 8/18 and delayed closure 6/20. Hospital course significant for cardiogenic shock requiring pressors, VDRF--tolerated extubation by 6/28 V. tach/V. Fib treated with urgent cardioversion X 2, acute blood loss anemia, encephalopathy, leukocytosis and volume overload. Fluid overload improved with IV diuresis, milrinone weaned off and he has been transitioned to orals.  Fevers have resolved on meropenum and BC X 2 negative. ICD turned back on today. On dysphagia 2, honey liquids with chin tuck due to dysphagia. Has had complaints of buttock pain and deep tissue injury bilateral buttocks showed minimal progress with santyl therefore hydrotherapy started today. Mentation and activity tolerance are improving but he continues to have decline in functional status. CIR recommended due to debility.  Review of Systems  Constitutional: Negative for chills and fever.  HENT: Negative for hearing loss and tinnitus.  Eyes: Positive for blurred vision.  Respiratory: Negative for cough and shortness of breath.  Cardiovascular: Negative for chest pain and palpitations.  Gastrointestinal: Negative for heartburn and nausea.  Genitourinary: Positive for frequency and urgency (urge incontinenc).  Gets up 3-4 times at nights  Musculoskeletal: Negative for back pain and myalgias.  Neurological: Negative for dizziness, weakness and headaches.  Psychiatric/Behavioral: The patient has insomnia (chronic). The patient is not  nervous/anxious.  Intermittent confusion continues       Past Medical History:  Diagnosis Date  . AICD (automatic cardioverter/defibrillator) present 02/05/2014   Upgrade to Medtronic biventricular ICD, serial number [id number]   . Atrial flutter ([place]) 04/2012   s/p TEE-EPS+RFCA 04/2012  . CAD (coronary artery disease) 0865,7846 X 2    RCA-T, 70% PL (off CFX), 99% Prox LAD/90% Dist LAD, S/P TAXUS stent x 2  . CHF (congestive heart failure) ([place])   . Chronic anticoagulation   . Chronic systolic heart failure ([place])   . CKD (chronic kidney disease)   . Diabetic retinopathy ([place])   . DM type 2 (diabetes mellitus, type 2) ([facility])    insulin dependent  . HTN (hypertension)   . Hypercholesteremia    ablation  . ICD (implantable cardiac defibrillator) in place   . Ischemic cardiomyopathy March 2015   20-25% 2D   . Nephrolithiasis   . Ventricular tachycardia [facility])         Past Surgical History:  Procedure Laterality Date  . ATRIAL FLUTTER ABLATION N/A 05/19/2012   Procedure: ATRIAL FLUTTER ABLATION; Surgeon: Thompson Grayer, MD; Location: [facility] CATH LAB; Service: Cardiovascular; Laterality: N/A;  . BI-VENTRICULAR IMPLANTABLE CARDIOVERTER DEFIBRILLATOR UPGRADE N/A 02/05/2014   Procedure: BI-VENTRICULAR IMPLANTABLE CARDIOVERTER DEFIBRILLATOR UPGRADE; Surgeon: Evans Lance, MD; Location: [facility] CATH LAB; Service: Cardiovascular; Laterality: N/A;  . BIV ICD GENERTAOR CHANGE OUT  02/05/2014   Upgrade to Medtronic biventricular ICD, serial number [id number] by Dr. [name]  . CARDIAC DEFIBRILLATOR PLACEMENT  2007   Medtronic Maximo VR, serial number [id number]  . INSERTION OF IMPLANTABLE LEFT VENTRICULAR ASSIST DEVICE N/A 08/16/2017   Procedure: INSERTION OF IMPLANTABLE LEFT VENTRICULAR ASSIST DEVICE-HM3; Surgeon: Ivin Poot, MD; Location: [place]; Service: Open Heart Surgery; Laterality: N/A;  . IR  FLUORO GUIDE CV LINE RIGHT  08/12/2017  . IR US GUIDE VASC ACCESS RIGHT  08/12/2017  . MEDIASTINAL  EXPLORATION  08/17/2017   Procedure: MEDIASTINAL REXPLORATION with evacuation of hematoma; Surgeon: Prescott Gum, Collier Salina, MD; Location: [facility] OR; Service: Open Heart Surgery;;  . PERCUTANEOUS CORONARY STENT INTERVENTION (PCI-S)  January 2002   PTCA/Stent Distal RCA  . PERCUTANEOUS CORONARY STENT INTERVENTION (PCI-S)  June 2002   PTCA/Stent x 3 RCA, thrombolysis - failed  . PERCUTANEOUS CORONARY STENT INTERVENTION (PCI-S)  July 2006   TAXUS stents to prox and distal LAD  . RIGHT HEART CATH N/A 07/22/2017   Procedure: RIGHT HEART CATH; Surgeon: Jolaine Artist, MD; Location: [place] CV LAB; Service: Cardiovascular; Laterality: N/A;  . RIGHT HEART CATH N/A 08/13/2017   Procedure: RIGHT HEART CATH - swan; Surgeon: Jolaine Artist, MD; Location: [place] [facility] CV LAB; Service: Cardiovascular; Laterality: N/A;  . RIGHT/LEFT HEART CATH AND CORONARY ANGIOGRAPHY N/A 02/18/2017   Procedure: RIGHT/LEFT HEART CATH AND CORONARY ANGIOGRAPHY; Surgeon: Jolaine Artist, MD; Location: [place] CV LAB; Service: Cardiovascular; Laterality: N/A;  . STERNAL CLOSURE N/A 08/19/2017   Procedure: STERNAL CLOSURE; Surgeon: Ivin Poot, MD; Location: [place]; Service: Thoracic; Laterality: N/A;  . TEE WITHOUT CARDIOVERSION N/A 08/16/2017   Procedure: TRANSESOPHAGEAL ECHOCARDIOGRAM (TEE); Surgeon: Prescott Gum, Collier Salina, MD; Location: [place]; Service: Open Heart Surgery; Laterality: N/A;  . TEE WITHOUT CARDIOVERSION N/A 08/19/2017   Procedure: TRANSESOPHAGEAL ECHOCARDIOGRAM (TEE); Surgeon: Prescott Gum, Collier Salina, MD; Location: [place]; Service: Thoracic; Laterality: N/A;  . TRICUSPID VALVE REPLACEMENT N/A 08/16/2017   Procedure: TRICUSPID VALVE REPAIR using Oletta Lamas MC3 Ring size 30; Surgeon: Ivin Poot, MD; Location: [place]; Service: Open Heart Surgery; Laterality: N/A;        Family History  Problem Relation Age of Onset  . Heart failure Father    Deceased  . Alzheimer's disease Mother    Living  . Heart attack Mother    . CAD Brother   . Other Brother    blood cancer  . CAD Brother   . Prostate cancer Brother   . CAD Brother   . CAD Brother   . Healthy Son   . Healthy Daughter   . Diabetes Brother   . Stomach cancer Neg Hx   . Colon cancer Neg Hx    Social History: Married. 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 He reports that he quit smoking about 17 years ago. His smoking use included cigarettes. He has a 29.00 pack-year smoking history. He has never used smokeless tobacco. He reports that he does not drink alcohol or use drugs.       Allergies  Allergen Reactions  . Penicillins Hives, Itching and Other (See Comments)    Has patient had a PCN reaction causing immediate rash, facial/tongue/throat swelling, SOB or lightheadedness with hypotension:# # Yes # #  Has patient had a PCN reaction causing severe rash involving mucus membranes or skin necrosis: No  Has patient had a PCN reaction that required hospitalization: No  Has patient had a PCN reaction occurring within the last 10 years: No  If all of the above answers are "NO", then may proceed with Cephalosporin use.          Medications Prior to Admission  Medication Sig Dispense Refill  . acetaminophen (TYLENOL) 500 MG tablet Take 1,000 mg by mouth every 6 (six) hours as needed for moderate pain or headache.    Marland Kitchen apixaban (  ELIQUIS) 5 MG TABS tablet Take 1 tablet (5 mg total) by mouth 2 (two) times daily. 60 tablet 6  . atorvastatin (LIPITOR) 80 MG tablet TAKE ONE TABLET BY MOUTH ONCE DAILY (Patient taking differently: TAKE 80 MG BY MOUTH ONCE DAILY) 90 tablet 3  . digoxin (LANOXIN) 0.125 MG tablet Take 1 tablet (0.125 mg total) by mouth daily. (Patient taking differently: Take 0.125 mg by mouth at bedtime. ) 90 tablet 3  . Dulaglutide (TRULICITY) 1.5 DT/2.6ZT SOPN Inject contents of pen weekly (Patient taking differently: Inject 1.5 mg into the skin every Friday. ) 4 pen 2  . gabapentin (NEURONTIN) 300 MG capsule Take 1  capsule (300 mg total) by mouth 3 (three) times daily. 270 capsule 3  . insulin NPH-regular Human (NOVOLIN 70/30 RELION) (70-30) 100 UNIT/ML injection Inject 30 Units into the skin 2 (two) times daily with a meal. 10 mL 12  . losartan (COZAAR) 25 MG tablet Take 12.5 mg by mouth at bedtime.    . magnesium oxide (MAG-OX) 400 MG tablet Take 1 tablet (400 mg total) by mouth daily. 90 tablet 3  . metFORMIN (GLUCOPHAGE-XR) 750 MG 24 hr tablet TAKE 2 TABLETS BY MOUTH ONCE DAILY WITH BREAKFAST 180 tablet 0  . Multiple Vitamins-Minerals (MULTIVITAMIN PO) Take 1 capsule by mouth daily.     . sotalol (BETAPACE) 120 MG tablet TAKE ONE TABLET BY MOUTH TWICE DAILY (Patient taking differently: TAKE 120MG  BY MOUTH TWICE DAILY) 180 tablet 3  . torsemide (DEMADEX) 100 MG tablet Take 50 mg by mouth See admin instructions. 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, physiological: Standard  Home Equipment: Careers adviser History:  Prior Function  Level of Independence: Independent  Comments: drives, cooks, works as a Dentist Status:  Mobility:  [place] bed mobility: Needs Assistance  Bed Mobility: Rolling, Sidelying to [place]: [place] to sit: Min assist  Supine to sit: Mod assist, +2 for physical assistance  Sit to sidelying: Min assist  General bed mobility comments: cues for sequence with assist to elevate trunk  Transfers  Overall transfer level: Needs assistance  Equipment used: 2 person hand held assist(Eva walker)  Transfer via Lift Equipment: [employer]  Transfers: Sit to/from Stand  Sit to Stand: Mod assist  Stand pivot transfers: Mod assist  General transfer  comment: mod assist for anterior translation and rise to stand from bed and chair x 3 trials with cues for hands on thighs, min assist to control descent  Ambulation/Gait  Ambulation/Gait assistance: Min assist, +2 safety/equipment  Gait Distance (Feet): 85 Feet  Assistive device: Rolling walker (2 wheeled)  Gait Pattern/deviations: Step-through pattern, Decreased stride length, Trunk flexed  General Gait Details: cues for posture, looking up and increased stride with encouragement to advance distance. Pt walked 34' then 11' with seated rest between trials grossly 2 min  Gait velocity: decreased  Gait velocity interpretation: <1.31 ft/sec, indicative of household ambulator   ADL:  ADL  Overall ADL's : Needs assistance/impaired  Eating/Feeding: Minimal assistance, Sitting  Eating/Feeding Details (indicate cue type and reason): able to drink from cup and use spoon with magic cup with cues for small  bite size and to tuck his chin with swallowing  Grooming: Sitting, Set up(blew nose)  Upper Body Bathing: With caregiver independent assisting, Sitting, Bed level  Lower Body Bathing: Total assistance, Bed level  Upper Body Dressing : Total assistance, Sitting  Lower Body Dressing: Total assistance, Bed level  Toilet Transfer: Minimal assistance, +2 for physical assistance, Ambulation, RW  Toilet Transfer Details (indicate cue type and reason): Mod A +2 sit<>stand, min A to ambulate wiht RW  Toileting- Clothing Manipulation and Hygiene: Total assistance  Toileting - Clothing Manipulation Details (indicate cue type and reason): min A +2 sit<>stand  Functional mobility during ADLs: Total assistance, +2 for physical assistance, Maximal assistance  General ADL Comments: Pt able to transition to/from battery/wall power with Max cues and min A for correct placement of connection ("half moons together) as well as increased time.  Cognition:  Cognition  Overall Cognitive Status: Impaired/Different from  baseline  Orientation Level: Oriented X4  Cognition  Arousal/Alertness: Awake/alert  Behavior During Therapy: WFL for tasks assessed/performed  Overall Cognitive Status: Impaired/Different from baseline  Area of Impairment: Memory, Following commands, Safety/judgement  Orientation Level: Time  Current Attention Level: Alternating  Memory: Decreased recall of precautions  Following Commands: Follows one step commands consistently  Safety/Judgement: Decreased awareness of deficits  Problem Solving: Decreased initiation, Difficulty sequencing, Requires verbal cues  General Comments: pt able to recall only 1 precaution, educateed for all, able to process power transition and after mod assist for initial transition to battery only required min cues to return to main power  Physical Exam:  Blood pressure 101/64, pulse 67, temperature 98.2 F (36.8 C), temperature source Oral, resp. rate (!) 25, height 5\' 11"  (1.803 m), weight 97.8 kg (215 lb 8 oz), SpO2 97 %.  Physical Exam  Nursing note and vitals reviewed.  Constitutional: He is oriented to person, place, and time. No distress.  HENT:  Head: Normocephalic and atraumatic.  Eyes: Pupils are equal, round, and reactive to light. EOM are normal.  Neck: Normal range of motion. Neck supple. No tracheal deviation present. No thyromegaly present.  Cardiovascular:  +HUM  Respiratory: Effort normal. No respiratory distress. He has no wheezes.  GI: Soft. He exhibits no distension. There is no tenderness.  Abdominal dressing intact  Genitourinary:  Genitourinary Comments: Condom cath in place (incontinence)  Musculoskeletal:  PICC line in place RUR. 1+l edema BLE.  Neurological: He is oriented to person, place, and time. No cranial nerve deficit.  Pt slow to arouse. UE 4- to 4/5 prox to distal. LE: 2+/5 HF, 3/5 KE and 4/5 ADF/PF. No sensory deficits. Fair insight and awareness. Slow to process  Skin: Skin is warm. He is not diaphoretic.  Foam  dressing on bilateral ear lobes. Dry dressing on abdomen.  Psychiatric: He has a normal mood and affect. His behavior is normal.   Lab Results Last 48 Hours  Imaging Results (Last 48 hours)     Medical Problem List and Plan:  1. Functional and mobility deficits secondary to debility after congestive heart failure/LVAD/VDRF and multiple other medical issues  -admit to inpatient reahb  2. DVT Prophylaxis/Anticoagulation: Pharmaceutical: Coumadin INR goal 2- 2.5 range.  3. Pain Management: tylenol prn. Premedicate with Oxy prior to hydrotherapy sessions.  4. Mood: LCSW to follow for evaluation and support.  5. Neuropsych: This patient is capable of making decisions on his own behalf.  6. Deep tissue injury bilateral buttocks/Skin/Wound Care: Air mattress overlay. Boost when in chair. Hydrotherapy to buttocks M->Sa.  7. Fluids/Electrolytes/Nutrition: Strict I/O. Continue nutritional supplement to promote wound healing due due to low  albumin levels. Add multivitamin.  8. T2DM: Monitor BS ac/hs. Continue Lantus  9. A fib: Off amiodarone. Monitor HR bid  10. 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 Started on [facility] 7/12. Continue Spironolactone and digoxin daily. Monitor renal status serially.  11. [name]/VF: S/p emergent cardioversion 6/24 and 6/26-->ICD turned on 7/12. To keep K> 4.0 and Mg> 2.0  12. ABLA: Improved with transfusion. Continue to monitor.  13. Dysphagia: Continue dysphagia 2, honey liquids with chin tuck. Hypernatremia has resolved.  14. LVAD implant 6/17: On coumadin and low dose ASA. Education ongoing with patient and wife. Dressing changes per VAD coordinator or trained caregiver.  Post Admission Physician Evaluation:  1. Functional deficits secondary to debility. 2. Patient is admitted to receive collaborative, interdisciplinary care between the physiatrist, rehab nursing staff, and therapy team. 3. Patient's level of medical complexity and substantial therapy needs in context of that medical necessity cannot be provided at a lesser intensity of care such as a SNF. 4. Patient has experienced substantial functional loss from his/her baseline which was documented above under the "Functional History" and "Functional Status" headings. Judging by the patient's diagnosis, physical exam, and functional history, the patient has potential for functional progress which will result in measurable gains while on inpatient rehab. These gains will be of substantial and practical use upon discharge in facilitating mobility and self-care at the household level. 5. Physiatrist will provide 24 hour management of medical needs as well as oversight of the therapy plan/treatment and provide guidance as appropriate regarding the interaction of the two. 6. The Preadmission Screening has been reviewed and patient status is unchanged unless otherwise stated above. 7. 24 hour rehab nursing will assist with bladder management,  bowel management, safety, skin/wound care, disease management, medication administration, pain management and patient education and help integrate therapy concepts, techniques,education, etc. 8. PT will assess and treat for/with: Lower extremity strength, range of motion, stamina, balance, functional mobility, safety, adaptive techniques and equipment, family education, activity tolerance. Goals are: min to mod asist. 9. OT will assess and treat for/with: ADL's, functional mobility, safety, upper extremity strength, adaptive techniques and equipment, family education, activity tolerance. Goals are: min to mod assist. Therapy may proceed with showering this patient. 10. SLP will assess and treat for/with: cognition, communication. Goals are: supervision. 11. Case Management and Social Worker will assess and treat for psychological issues and discharge planning. 12. Team conference will be held weekly to assess progress toward goals and to determine barriers to discharge. 13. Patient will receive at least 3 hours of therapy per day at least 5 days per week. 14. ELOS: 18-22 days  15. Prognosis: excellent   I have personally performed a face to face diagnostic evaluation of this patient and formulated the key components of the plan. Additionally,  I have personally reviewed laboratory data, imaging studies, as well as relevant notes and concur with the physician assistant's documentation above.  Meredith Staggers, MD, Mellody Drown  Bary Leriche, PA-C  09/10/2017

## 2017-09-10 NOTE — Progress Notes (Signed)
PT Cancellation Note  Patient Details Name: [name] [name] MRN: [id number] DOB: 16-Apr-1950   Cancelled Treatment:    Reason Eval/Treat Not Completed: Other (comment)(pt sitting EOB having just bathed and reports he just walked to nurses desk this am, currently fatigued. )   Danne Vasek B Janique Hoefer 09/10/2017, 7:41 AM  Elwyn Reach, [place]

## 2017-09-10 NOTE — H&P (Signed)
Physical Medicine and Rehabilitation Admission H&P    CC: Debility   HPI: [name] [name] is a 67 year old male with past medical history of ventricular tachycardia, ischemic cardiomyopathy status post ICD, hypertension, type 2 diabetes mellitus with retinopathy, CKD, chronic systolic heart failure, CAD, a flutter who presented on 08/11/2017 due to CHF.  He was admitted with plans for home milrinone initiation however noted to be fluid overloaded and ultimately had LVAD placed on 6/17 with hematoma evacuation on 8/18 and delayed closure 6/20.  Hospital course significant for cardiogenic shock requiring pressors, VDRF--tolerated extubation by 6/28  V. tach/V. Fib treated with urgent cardioversion X 2,  acute blood loss anemia, encephalopathy, leukocytosis and volume overload. Fluid overload improved with IV diuresis, milrinone weaned off and he has been transitioned to orals.  Fevers have resolved on meropenum and BC X 2 negative. ICD turned back on today.  On dysphagia 2, honey liquids with chin tuck due to dysphagia.   Has had complaints of buttock pain and deep tissue injury bilateral buttocks showed minimal progress with santyl therefore hydrotherapy started today. Mentation and activity tolerance are improving but he continues to have decline in functional status. CIR recommended due to debility.    Review of Systems  Constitutional: Negative for chills and fever.  HENT: Negative for hearing loss and tinnitus.   Eyes: Positive for blurred vision.  Respiratory: Negative for cough and shortness of breath.   Cardiovascular: Negative for chest pain and palpitations.  Gastrointestinal: Negative for heartburn and nausea.  Genitourinary: Positive for frequency and urgency (urge incontinenc).       Gets up 3-4 times at nights  Musculoskeletal: Negative for back pain and myalgias.  Neurological: Negative for dizziness, weakness and headaches.  Psychiatric/Behavioral: The patient has insomnia  (chronic). The patient is not nervous/anxious.        Intermittent confusion continues       Past Medical History:  Diagnosis Date  . AICD (automatic cardioverter/defibrillator) present 02/05/2014   Upgrade to Medtronic biventricular ICD, serial number  [id number]   . Atrial flutter ([place]) 04/2012   s/p TEE-EPS+RFCA 04/2012  . CAD (coronary artery disease) [id number],3149 X 2    RCA-T, 70% PL (off CFX), 99% Prox LAD/90% Dist LAD, S/P TAXUS stent x 2  . CHF (congestive heart failure) ([place])   . Chronic anticoagulation   . Chronic systolic heart failure ([place])   . CKD (chronic kidney disease)   . Diabetic retinopathy ([place])   . DM type 2 (diabetes mellitus, type 2) (H[facility])    insulin dependent  . HTN (hypertension)   . Hypercholesteremia    ablation  . ICD (implantable cardiac defibrillator) in place   . Ischemic cardiomyopathy March 2015   20-25% 2D   . Nephrolithiasis   . Ventricular tachycardia [facility])     Past Surgical History:  Procedure Laterality Date  . ATRIAL FLUTTER ABLATION N/A 05/19/2012   Procedure: ATRIAL FLUTTER ABLATION;  Surgeon: Thompson Grayer, MD;  Location: [facility] CATH LAB;  Service: Cardiovascular;  Laterality: N/A;  . BI-VENTRICULAR IMPLANTABLE CARDIOVERTER DEFIBRILLATOR UPGRADE N/A 02/05/2014   Procedure: BI-VENTRICULAR IMPLANTABLE CARDIOVERTER DEFIBRILLATOR UPGRADE;  Surgeon: Evans Lance, MD;  Location: [facility] CATH LAB;  Service: Cardiovascular;  Laterality: N/A;  . BIV ICD GENERTAOR CHANGE OUT  02/05/2014   Upgrade to Medtronic biventricular ICD, serial number  [id number] by Dr. [name]  . CARDIAC DEFIBRILLATOR PLACEMENT  2007    Medtronic Maximo VR, serial number [id number]  . INSERTION OF  IMPLANTABLE LEFT VENTRICULAR ASSIST DEVICE N/A 08/16/2017   Procedure: INSERTION OF IMPLANTABLE LEFT VENTRICULAR ASSIST DEVICE-HM3;  Surgeon: Ivin Poot, MD;  Location: [place];  Service: Open Heart Surgery;  Laterality: N/A;  . IR FLUORO GUIDE CV LINE RIGHT  08/12/2017  . IR US GUIDE  VASC ACCESS RIGHT  08/12/2017  . MEDIASTINAL EXPLORATION  08/17/2017   Procedure: MEDIASTINAL REXPLORATION with evacuation of hematoma;  Surgeon: Prescott Gum, Collier Salina, MD;  Location: [facility] OR;  Service: Open Heart Surgery;;  . PERCUTANEOUS CORONARY STENT INTERVENTION (PCI-S)  January 2002   PTCA/Stent Distal RCA  . PERCUTANEOUS CORONARY STENT INTERVENTION (PCI-S)  June 2002   PTCA/Stent x 3 RCA, thrombolysis - failed  . PERCUTANEOUS CORONARY STENT INTERVENTION (PCI-S)  July 2006   TAXUS stents to prox and distal LAD  . RIGHT HEART CATH N/A 07/22/2017   Procedure: RIGHT HEART CATH;  Surgeon: Jolaine Artist, MD;  Location: [place] CV LAB;  Service: Cardiovascular;  Laterality: N/A;  . RIGHT HEART CATH N/A 08/13/2017   Procedure: RIGHT HEART CATH - swan;  Surgeon: Jolaine Artist, MD;  Location: [place] CV LAB;  Service: Cardiovascular;  Laterality: N/A;  . RIGHT/LEFT HEART CATH AND CORONARY ANGIOGRAPHY N/A 02/18/2017   Procedure: RIGHT/LEFT HEART CATH AND CORONARY ANGIOGRAPHY;  Surgeon: Jolaine Artist, MD;  Location: [place] CV LAB;  Service: Cardiovascular;  Laterality: N/A;  . STERNAL CLOSURE N/A 08/19/2017   Procedure: STERNAL CLOSURE;  Surgeon: Ivin Poot, MD;  Location: [place];  Service: Thoracic;  Laterality: N/A;  . TEE WITHOUT CARDIOVERSION N/A 08/16/2017   Procedure: TRANSESOPHAGEAL ECHOCARDIOGRAM (TEE);  Surgeon: Prescott Gum, Collier Salina, MD;  Location: [place];  Service: Open Heart Surgery;  Laterality: N/A;  . TEE WITHOUT CARDIOVERSION N/A 08/19/2017   Procedure: TRANSESOPHAGEAL ECHOCARDIOGRAM (TEE);  Surgeon: Prescott Gum, Collier Salina, MD;  Location: [place];  Service: Thoracic;  Laterality: N/A;  . TRICUSPID VALVE REPLACEMENT N/A 08/16/2017   Procedure: TRICUSPID VALVE REPAIR using Oletta Lamas MC3 Ring size 30;  Surgeon: Ivin Poot, MD;  Location: [place];  Service: Open Heart Surgery;  Laterality: N/A;    Family History  Problem Relation Age of Onset  . Heart failure Father         Deceased  . Alzheimer's disease Mother        Living  . Heart attack Mother   . CAD Brother   . Other Brother        blood cancer  . CAD Brother   . Prostate cancer Brother   . CAD Brother   . CAD Brother   . Healthy Son   . Healthy Daughter   . Diabetes Brother   . Stomach cancer Neg Hx   . Colon cancer Neg Hx     Social History: Married. 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  He  reports that he quit smoking about 17 years ago. His smoking use included cigarettes. He has a 29.00 pack-year smoking history. He has never used smokeless tobacco. He reports that he does not drink alcohol or use drugs.    Allergies  Allergen Reactions  . Penicillins Hives, Itching and Other (See Comments)    Has patient had a PCN reaction causing immediate rash, facial/tongue/throat swelling, SOB or lightheadedness with hypotension:# # Yes # # Has patient had a PCN reaction causing severe rash involving mucus membranes or skin necrosis: No Has patient had a PCN reaction that required hospitalization: No  Has patient had a PCN reaction occurring within the last 10 years: No If all of the above answers are "NO", then may proceed with Cephalosporin use.     Medications Prior to Admission  Medication Sig Dispense Refill  . acetaminophen (TYLENOL) 500 MG tablet Take 1,000 mg by mouth every 6 (six) hours as needed for moderate pain or headache.    Marland Kitchen apixaban (ELIQUIS) 5 MG TABS tablet Take 1 tablet (5 mg total) by mouth 2 (two) times daily. 60 tablet 6  . atorvastatin (LIPITOR) 80 MG tablet TAKE ONE TABLET BY MOUTH ONCE DAILY (Patient taking differently: TAKE 80 MG BY MOUTH ONCE DAILY) 90 tablet 3  . digoxin (LANOXIN) 0.125 MG tablet Take 1 tablet (0.125 mg total) by mouth daily. (Patient taking differently: Take 0.125 mg by mouth at bedtime. ) 90 tablet 3  . Dulaglutide (TRULICITY) 1.5 OZ/2.2QM SOPN Inject contents of pen weekly (Patient taking differently: Inject 1.5 mg into the  skin every Friday. ) 4 pen 2  . gabapentin (NEURONTIN) 300 MG capsule Take 1 capsule (300 mg total) by mouth 3 (three) times daily. 270 capsule 3  . insulin NPH-regular Human (NOVOLIN 70/30 RELION) (70-30) 100 UNIT/ML injection Inject 30 Units into the skin 2 (two) times daily with a meal. 10 mL 12  . losartan (COZAAR) 25 MG tablet Take 12.5 mg by mouth at bedtime.    . magnesium oxide (MAG-OX) 400 MG tablet Take 1 tablet (400 mg total) by mouth daily. 90 tablet 3  . metFORMIN (GLUCOPHAGE-XR) 750 MG 24 hr tablet TAKE 2 TABLETS BY MOUTH ONCE DAILY WITH BREAKFAST 180 tablet 0  . Multiple Vitamins-Minerals (MULTIVITAMIN PO) Take 1 capsule by mouth daily.     . sotalol (BETAPACE) 120 MG tablet TAKE ONE TABLET BY MOUTH TWICE DAILY (Patient taking differently: TAKE 120MG BY MOUTH TWICE DAILY) 180 tablet 3  . torsemide (DEMADEX) 100 MG tablet Take 50 mg by mouth See admin instructions. Take 50 mg by mouth daily except DO NOT TAKE on Sunday      Drug Regimen Review  Drug regimen was reviewed and remains appropriate with no significant issues identified  Home: Home Living Family/patient expects to be discharged to:: Private residence Living Arrangements: Spouse/significant other Type of Home: Financial controller: Standard Home Equipment: Industrial/product designer History: Prior Function Level of Independence: Independent Comments: drives, cooks, works as a Dentist Status:  Mobility: [place] bed mobility: Needs Assistance Bed Mobility: Rolling, Sidelying to [place]: [place] to sit: Min assist Supine to sit: Mod assist, +2 for physical assistance Sit to sidelying: Min assist General bed mobility comments: cues for sequence with assist to elevate trunk Transfers Overall transfer level: Needs assistance Equipment used: 2 person hand held assist(Eva walker) Transfer via Lift Equipment: [employer] Transfers: Sit to/from Stand Sit to Stand: Mod  assist Stand pivot transfers: Mod assist General transfer comment: mod assist for anterior translation and rise to stand from bed and chair x 3 trials with cues for hands on thighs, min assist to control descent  Ambulation/Gait Ambulation/Gait assistance: Min assist, +2 safety/equipment Gait Distance (Feet): 85 Feet Assistive device: Rolling walker (2 wheeled) Gait Pattern/deviations: Step-through pattern, Decreased stride length, Trunk flexed General Gait Details: cues for posture, looking up and increased stride with encouragement to advance distance. Pt walked 72' then 62' with seated rest between trials grossly 2 min Gait velocity: decreased Gait velocity interpretation: <1.31 ft/sec, indicative of household ambulator  ADL: ADL Overall ADL's : Needs assistance/impaired Eating/Feeding: Minimal assistance, Sitting Eating/Feeding Details (indicate cue type and reason): able to drink from cup and use spoon with magic cup with cues for small bite size and to tuck his chin with swallowing Grooming: Sitting, Set up(blew nose) Upper Body Bathing: With caregiver independent assisting, Sitting, Bed level Lower Body Bathing: Total assistance, Bed level Upper Body Dressing : Total assistance, Sitting Lower Body Dressing: Total assistance, Bed level Toilet Transfer: Minimal assistance, +2 for physical assistance, Ambulation, RW Toilet Transfer Details (indicate cue type and reason): Mod A +2 sit<>stand, min A to ambulate wiht RW Toileting- Clothing Manipulation and Hygiene: Total assistance Toileting - Clothing Manipulation Details (indicate cue type and reason): min A +2 sit<>stand Functional mobility during ADLs: Total assistance, +2 for physical assistance, Maximal assistance General ADL Comments: Pt able to transition to/from battery/wall power with Max cues and min A for correct placement of connection ("half moons together) as well as increased time.  Cognition: Cognition Overall  Cognitive Status: Impaired/Different from baseline Orientation Level: Oriented X4 Cognition Arousal/Alertness: Awake/alert Behavior During Therapy: WFL for tasks assessed/performed Overall Cognitive Status: Impaired/Different from baseline Area of Impairment: Memory, Following commands, Safety/judgement Orientation Level: Time Current Attention Level: Alternating Memory: Decreased recall of precautions Following Commands: Follows one step commands consistently Safety/Judgement: Decreased awareness of deficits Problem Solving: Decreased initiation, Difficulty sequencing, Requires verbal cues General Comments: pt able to recall only 1 precaution, educateed for all, able to process power transition and after mod assist for initial transition to battery only required min cues to return to main power  Physical Exam: Blood pressure 101/64, pulse 67, temperature 98.2 F (36.8 C), temperature source Oral, resp. rate (!) 25, height '5\' 11"'  (1.803 m), weight 97.8 kg (215 lb 8 oz), SpO2 97 %. Physical Exam  Nursing note and vitals reviewed. Constitutional: He is oriented to person, place, and time. No distress.  HENT:  Head: Normocephalic and atraumatic.  Eyes: Pupils are equal, round, and reactive to light. EOM are normal.  Neck: Normal range of motion. Neck supple. No tracheal deviation present. No thyromegaly present.  Cardiovascular:  +HUM  Respiratory: Effort normal. No respiratory distress. He has no wheezes.  GI: Soft. He exhibits no distension. There is no tenderness.  Abdominal dressing intact  Genitourinary:  Genitourinary Comments: Condom cath in place (incontinence)  Musculoskeletal:  PICC line in place RUR. 1+l edema BLE.   Neurological: He is oriented to person, place, and time. No cranial nerve deficit.  Pt slow to arouse. UE 4- to 4/5 prox to distal. LE: 2+/5 HF, 3/5 KE and 4/5 ADF/PF. No sensory deficits. Fair insight and awareness. Slow to process  Skin: Skin is warm. He is  not diaphoretic.  Foam dressing on bilateral ear lobes. Dry dressing on abdomen.   Psychiatric: He has a normal mood and affect. His behavior is normal.    Results for orders placed or performed during the hospital encounter of 08/11/17 (from the past 48 hour(s))  Glucose, capillary     Status: Abnormal   Collection Time: 09/08/17  5:01 PM  Result Value Ref Range   Glucose-Capillary 158 (H) 70 - 99 mg/dL  Glucose, capillary     Status: Abnormal   Collection Time: 09/08/17  7:39 PM  Result Value Ref Range   Glucose-Capillary 161 (H) 70 - 99 mg/dL   Comment 1 Notify RN   Lactate dehydrogenase     Status: Abnormal   Collection Time: 09/09/17  5:00 AM  Result Value Ref Range   LDH 197 (H) 98 - 192 U/L    Comment: Performed at [facility], [place] [address], [place], [place] [address]  Protime-INR     Status: Abnormal   Collection Time: 09/09/17  5:00 AM  Result Value Ref Range   Prothrombin Time 22.2 (H) 11.4 - 15.2 seconds   INR 1.97     Comment: Performed at [facility] [address] [place], [place] [address]  Magnesium     Status: None   Collection Time: 09/09/17  5:00 AM  Result Value Ref Range   Magnesium 1.9 1.7 - 2.4 mg/dL    Comment: Performed at [facility], [place] [address], [place], [place] [address]  CBC     Status: Abnormal   Collection Time: 09/09/17  5:00 AM  Result Value Ref Range   WBC 6.6 4.0 - 10.5 K/uL   RBC 3.60 (L) 4.22 - 5.81 MIL/uL   Hemoglobin 9.1 (L) 13.0 - 17.0 g/dL   HCT 30.6 (L) 39.0 - 52.0 %   MCV 85.0 78.0 - 100.0 fL   MCH 25.3 (L) 26.0 - 34.0 pg   MCHC 29.7 (L) 30.0 - 36.0 g/dL   RDW 18.6 (H) 11.5 - 15.5 %   Platelets 326 150 - 400 K/uL    Comment: Performed at [facility], [place] [address], [place], [place] [address]  Comprehensive metabolic panel     Status: Abnormal   Collection Time: 09/09/17  5:00 AM  Result Value Ref Range   Sodium 140 135 - 145 mmol/L   Potassium 4.1 3.5 - 5.1 mmol/L   Chloride 107 98 - 111  mmol/L    Comment: Please note change in reference range.   CO2 24 22 - 32 mmol/L   Glucose, Bld 119 (H) 70 - 99 mg/dL    Comment: Please note change in reference range.   BUN 21 8 - 23 mg/dL    Comment: Please note change in reference range.   Creatinine, Ser 1.11 0.61 - 1.24 mg/dL   Calcium 8.9 8.9 - 10.3 mg/dL   Total Protein 7.4 6.5 - 8.1 g/dL   Albumin 2.4 (L) 3.5 - 5.0 g/dL   AST 25 15 - 41 U/L   ALT 24 0 - 44 U/L    Comment: Please note change in reference range.   Alkaline Phosphatase 75 38 - 126 U/L   Total Bilirubin 1.1 0.3 - 1.2 mg/dL   GFR calc non Af Amer >60 >60 mL/min   GFR calc Af Amer >60 >60 mL/min    Comment: (NOTE) The eGFR has been calculated using the CKD EPI equation. This calculation has not been validated in all clinical situations. eGFR's persistently <60 mL/min signify possible Chronic Kidney Disease.    Anion gap 9 5 - 15    Comment: Performed at [facility] [address] [place], [place] [address]  Cooxemetry Panel (carboxy, met, total hgb, O2 sat)     Status: Abnormal   Collection Time: 09/09/17  5:10 AM  Result Value Ref Range   Total hemoglobin 9.3 (L) 12.0 - 16.0 g/dL   O2 Saturation 52.1 %   Carboxyhemoglobin 2.3 (H) 0.5 - 1.5 %   Methemoglobin 1.0 0.0 - 1.5 %  Glucose, capillary     Status: None   Collection Time: 09/09/17  7:30 AM  Result Value Ref Range   Glucose-Capillary 94 70 - 99 mg/dL  Glucose,  capillary     Status: Abnormal   Collection Time: 09/09/17 12:48 PM  Result Value Ref Range   Glucose-Capillary 121 (H) 70 - 99 mg/dL  Glucose, capillary     Status: Abnormal   Collection Time: 09/09/17  5:20 PM  Result Value Ref Range   Glucose-Capillary 124 (H) 70 - 99 mg/dL  Glucose, capillary     Status: Abnormal   Collection Time: 09/09/17  9:23 PM  Result Value Ref Range   Glucose-Capillary 154 (H) 70 - 99 mg/dL   Comment 1 Notify RN   Lactate dehydrogenase     Status: None   Collection Time: 09/10/17  5:00 AM  Result  Value Ref Range   LDH 192 98 - 192 U/L    Comment: Performed at [facility], [place] [address], [place], [place] [address]  Protime-INR     Status: Abnormal   Collection Time: 09/10/17  5:00 AM  Result Value Ref Range   Prothrombin Time 21.8 (H) 11.4 - 15.2 seconds   INR 1.92     Comment: Performed at [facility] [address], [place], [place] [address]  Magnesium     Status: None   Collection Time: 09/10/17  5:00 AM  Result Value Ref Range   Magnesium 2.0 1.7 - 2.4 mg/dL    Comment: Performed at [facility], [place] [address], [place], [place] [address]  CBC     Status: Abnormal   Collection Time: 09/10/17  5:00 AM  Result Value Ref Range   WBC 6.0 4.0 - 10.5 K/uL   RBC 3.60 (L) 4.22 - 5.81 MIL/uL   Hemoglobin 9.2 (L) 13.0 - 17.0 g/dL   HCT 30.1 (L) 39.0 - 52.0 %   MCV 83.6 78.0 - 100.0 fL   MCH 25.6 (L) 26.0 - 34.0 pg   MCHC 30.6 30.0 - 36.0 g/dL   RDW 18.5 (H) 11.5 - 15.5 %   Platelets 274 150 - 400 K/uL    Comment: Performed at [facility], [place] [address] [place], [place] [address]  Comprehensive metabolic panel     Status: Abnormal   Collection Time: 09/10/17  5:00 AM  Result Value Ref Range   Sodium 137 135 - 145 mmol/L   Potassium 3.9 3.5 - 5.1 mmol/L   Chloride 101 98 - 111 mmol/L    Comment: Please note change in reference range.   CO2 25 22 - 32 mmol/L   Glucose, Bld 125 (H) 70 - 99 mg/dL    Comment: Please note change in reference range.   BUN 19 8 - 23 mg/dL    Comment: Please note change in reference range.   Creatinine, Ser 1.19 0.61 - 1.24 mg/dL   Calcium 8.8 (L) 8.9 - 10.3 mg/dL   Total Protein 7.4 6.5 - 8.1 g/dL   Albumin 2.3 (L) 3.5 - 5.0 g/dL   AST 27 15 - 41 U/L   ALT 23 0 - 44 U/L    Comment: Please note change in reference range.   Alkaline Phosphatase 74 38 - 126 U/L   Total Bilirubin 1.4 (H) 0.3 - 1.2 mg/dL   GFR calc non Af Amer >60 >60 mL/min   GFR calc Af Amer >60 >60 mL/min    Comment: (NOTE) The eGFR has been  calculated using the CKD EPI equation. This calculation has not been validated in all clinical situations. eGFR's persistently <60 mL/min signify possible Chronic Kidney Disease.  Anion gap 11 5 - 15    Comment: Performed at [facility] [address], [place], [place] [address]  Glucose, capillary     Status: None   Collection Time: 09/10/17  7:51 AM  Result Value Ref Range   Glucose-Capillary 90 70 - 99 mg/dL   Comment 1 Notify RN    Comment 2 Document in Chart   Glucose, capillary     Status: Abnormal   Collection Time: 09/10/17 12:24 PM  Result Value Ref Range   Glucose-Capillary 132 (H) 70 - 99 mg/dL   Comment 1 Notify RN    Comment 2 Document in Chart    No results found.     Medical Problem List and Plan: 1.  Functional and mobility deficits secondary to debility after congestive heart failure/LVAD/VDRF and multiple other medical issues  -admit to inpatient reahb 2.  DVT Prophylaxis/Anticoagulation: Pharmaceutical: Coumadin INR goal 2- 2.5 range.  3. Pain Management: tylenol prn. Premedicate with Oxy prior to hydrotherapy sessions.  4. Mood:  LCSW to follow for evaluation and support.  5. Neuropsych: This patient is capable of making decisions on his own behalf. 6. Deep tissue injury bilateral buttocks/Skin/Wound Care: Air mattress overlay. Boost when in chair. Hydrotherapy to buttocks M->Sa.  7. Fluids/Electrolytes/Nutrition: Strict I/O. Continue nutritional supplement to promote wound healing due due to low albumin levels. Add multivitamin.  8. T2DM: Monitor BS ac/hs. Continue Lantus 9. A fib: Off amiodarone. Monitor HR bid  10. 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 Started on [facility] 7/12. Continue Spironolactone and digoxin daily. Monitor renal status serially. 11. [name]/VF:  S/p emergent cardioversion 6/24 and 6/26-->ICD turned on 7/12. To keep K> 4.0 and Mg> 2.0 12. ABLA: Improved with transfusion. Continue to monitor.  13. Dysphagia:  Continue dysphagia 2, honey liquids with chin tuck. Hypernatremia has resolved.  14. LVAD implant 6/17: On coumadin and low dose ASA.  Education ongoing with patient and wife. Dressing changes per VAD coordinator or trained caregiver.   Post Admission Physician Evaluation: 1. Functional deficits secondary  to debility. 2. Patient is admitted to receive collaborative, interdisciplinary care between the physiatrist, rehab nursing staff, and therapy team. 3. Patient's level of medical complexity and substantial therapy needs in context of that medical necessity cannot be provided at a lesser intensity of care such as a SNF. 4. Patient has experienced substantial functional loss from his/her baseline which was documented above under the "Functional History" and "Functional Status" headings.  Judging by the patient's diagnosis, physical exam, and functional history, the patient has potential for functional progress which will result in measurable gains while on inpatient rehab.  These gains will be of substantial and practical use upon discharge  in facilitating mobility and self-care at the household level. 5. Physiatrist will provide 24 hour management of medical needs as well as oversight of the therapy plan/treatment and provide guidance as appropriate regarding the interaction of the two. 6. The Preadmission Screening has been reviewed and patient status is unchanged unless otherwise stated above. 7. 24 hour rehab nursing will assist with bladder management, bowel management, safety, skin/wound care, disease management, medication administration, pain management and patient education  and help integrate therapy concepts, techniques,education, etc. 8. PT will assess and treat for/with: Lower extremity strength, range of motion, stamina, balance, functional mobility, safety, adaptive techniques and equipment, family education, activity tolerance.   Goals are: min to mod asist. 9. OT will assess and treat  for/with: ADL's, functional mobility, safety, upper  extremity strength, adaptive techniques and equipment, family education, activity tolerance.   Goals are: min to mod assist. Therapy may proceed with showering this patient. 10. SLP will assess and treat for/with: cognition, communication.  Goals are: supervision. 11. Case Management and Social Worker will assess and treat for psychological issues and discharge planning. 12. Team conference will be held weekly to assess progress toward goals and to determine barriers to discharge. 13. Patient will receive at least 3 hours of therapy per day at least 5 days per week. 14. ELOS: 18-22 days       15. Prognosis:  excellent       I have personally performed a face to face diagnostic evaluation of this patient and formulated the key components of the plan.  Additionally, I have personally reviewed laboratory data, imaging studies, as well as relevant notes and concur with the physician assistant's documentation above.  Meredith Staggers, MD, [facility]   Bary Leriche, PA-C 09/10/2017

## 2017-09-10 NOTE — Progress Notes (Signed)
CARDIAC REHAB PHASE I   Ed completed with pt and daughter. Voiced understanding. Needs increased practice with IS (500 mL, hard for him). Will refer to [place], ACSM 09/10/2017 8:40 AM

## 2017-09-10 NOTE — Progress Notes (Signed)
No ICM remote transmission received for 09/06/2017 and next ICM transmission scheduled for 10/04/2017.

## 2017-09-10 NOTE — Progress Notes (Signed)
Physical Therapy Wound Evaluation/Treatment Patient Details  Name: [name] [name] MRN: [id number] Date of Birth: June 05, 1950  Today's Date: 09/10/2017 Time: 9323-5573 Time Calculation (min): 36 min  Subjective  Subjective: Agreeable to therapy. Family with many questions. Patient and Family Stated Goals: Heal wound Prior Treatments: Wet to dry dressing changes with Santyl  Pain Score: Pt sleeping upon arrival and had been premedicated for pain. Noted min-mod pain with treatment evidenced by pt clenching gluteals and moving away from stimuli during treatment.   Wound Assessment  Pressure Injury 08/27/17 Unstageable - Full thickness tissue loss in which the base of the ulcer is covered by slough (yellow, tan, gray, green or brown) and/or eschar (tan, brown or black) in the wound bed. 8.5 x 6 (90% darkened 5% yellow and 5% pink (Active)  Wound Image   09/10/2017 10:51 AM  Dressing Type ABD;Barrier Film (skin prep);Gauze (Comment);Moist to dry 09/10/2017 10:51 AM  Dressing Changed;Clean;Dry;Intact 09/10/2017 10:51 AM  Dressing Change Frequency Daily 09/10/2017 10:51 AM  State of Healing Eschar 09/10/2017 10:51 AM  Site / Wound Assessment Pink;Pale;Yellow;Brown 09/10/2017 10:51 AM  % Wound base Red or Granulating 20% 09/10/2017 10:51 AM  % Wound base Yellow/Fibrinous Exudate 60% 09/10/2017 10:51 AM  % Wound base Black/Eschar 20% 09/10/2017 10:51 AM  Peri-wound Assessment Intact 09/10/2017 10:51 AM  Wound Length (cm) 9.1 cm 09/10/2017 10:51 AM  Wound Width (cm) 7.5 cm 09/10/2017 10:51 AM  Wound Depth (cm) 0.2 cm 09/10/2017 10:51 AM  Wound Surface Area (cm^2) 68.25 cm^2 09/10/2017 10:51 AM  Wound Volume (cm^3) 13.65 cm^3 09/10/2017 10:51 AM  Tunneling (cm) 0 09/10/2017 10:51 AM  Undermining (cm) 0 09/10/2017 10:51 AM  Margins Unattached edges (unapproximated) 09/10/2017 10:51 AM  Drainage Amount Moderate 09/10/2017 10:51 AM  Drainage Description Purulent 09/10/2017 10:51 AM  Treatment Debridement  (Selective);Hydrotherapy (Pulse lavage);Packing (Saline gauze) 09/10/2017 10:51 AM   Santyl applied to wound bed prior to applying dressing.    Hydrotherapy Pulsed lavage therapy - wound location: R buttock Pulsed Lavage with Suction (psi): 8 psi Pulsed Lavage with Suction - Normal Saline Used: 1000 mL Pulsed Lavage Tip: Tip with splash shield Selective Debridement Selective Debridement - Location: R buttock Selective Debridement - Tools Used: Forceps;Scalpel Selective Debridement - Tissue Removed: Brown and yellow necrotic tissue   Wound Assessment and Plan  Wound Therapy - Assess/Plan/Recommendations Wound Therapy - Clinical Statement: Pt presents to hydrotherapy with a R buttock wound. Necrotic tissue is tightly adhered and minimal debridement was able to be completed. Scored the remainder of the unviable tissue and wound was dressed with Santyl and moist gauze. This patient will benefit from continued hydrotherapy for selective removal of unviable tissue and to promote wound bed healing.  Wound Therapy - Functional Problem List: Decreased tolerance for OOB and position changes Factors Delaying/Impairing Wound Healing: Diabetes Mellitus;Immobility;Multiple medical problems Hydrotherapy Plan: Debridement;Dressing change;Patient/family education;Pulsatile lavage with suction Wound Therapy - Frequency: 6X / week Wound Therapy - Follow Up Recommendations: Other (comment)(CIR) Wound Plan: See above  Wound Therapy Goals- Improve the function of patient's integumentary system by progressing the wound(s) through the phases of wound healing (inflammation - proliferation - remodeling) by: Decrease Necrotic Tissue to: 0% Decrease Necrotic Tissue - Progress: Goal set today Increase Granulation Tissue to: 100% Increase Granulation Tissue - Progress: Goal set today Goals/treatment plan/discharge plan were made with and agreed upon by patient/family: Yes Time For Goal Achievement: 7 days Wound  Therapy - Potential for Goals: Good  Goals will be updated until maximal potential  achieved or discharge criteria met.  Discharge criteria: when goals achieved, discharge from hospital, MD decision/surgical intervention, no progress towards goals, refusal/missing three consecutive treatments without notification or medical reason.  GP     Thelma Comp 09/10/2017, 11:07 AM   Rolinda Roan, PT, DPT Acute Rehabilitation Services Pager: [phone]

## 2017-09-10 NOTE — Progress Notes (Signed)
Physical Medicine and Rehabilitation Consult Reason for Consult: Severe deconditioning S/P VAD Referring Physician: Jolaine Artist, MD     HPI: [name] [name] is a 67 y.o. male with past medical history of ventricular tachycardia, ischemic cardiomyopathy status post ICD, hypertension, diabetes mellitus type 2 with retinopathy, CKD, chronic systolic congestive heart failure, CAD, atrial flutter presented on 08/11/2017 due to CHF.  History taken from chart review.  Patient was admitted with plans for home milrinone initiation.  He has noted to be fluid overloaded and ultimately had an LVAD placed on 6/17.  Hospital course complicated by cardiogenic shock (requiring pressors), respiratory failure, V. tach/V. Fib, tachypnea, leukocytosis, acute blood loss anemia.      Review of Systems  Constitutional: Positive for malaise/fatigue.  Cardiovascular: Positive for chest pain.       MSK  Neurological: Positive for weakness.  All other systems reviewed and are negative.       Past Medical History:  Diagnosis Date  . AICD (automatic cardioverter/defibrillator) present 02/05/2014    Upgrade to Medtronic biventricular ICD, serial number  [id number]   . Atrial flutter ([place]) 04/2012    s/p TEE-EPS+RFCA 04/2012  . CAD (coronary artery disease) 9476,5465 X 2     RCA-T, 70% PL (off CFX), 99% Prox LAD/90% Dist LAD, S/P TAXUS stent x 2  . CHF (congestive heart failure) ([place])    . Chronic anticoagulation    . Chronic systolic heart failure ([place])    . CKD (chronic kidney disease)    . Diabetic retinopathy ([place])    . DM type 2 (diabetes mellitus, type 2) (HCC)      insulin dependent  . HTN (hypertension)    . Hypercholesteremia      ablation  . ICD (implantable cardiac defibrillator) in place    . Ischemic cardiomyopathy March 2015    20-25% 2D   . Nephrolithiasis    . Ventricular tachycardia [facility])           Past Surgical History:  Procedure Laterality Date  . ATRIAL FLUTTER ABLATION N/A  05/19/2012    Procedure: ATRIAL FLUTTER ABLATION;  Surgeon: Thompson Grayer, MD;  Location: [facility] CATH LAB;  Service: Cardiovascular;  Laterality: N/A;  . BI-VENTRICULAR IMPLANTABLE CARDIOVERTER DEFIBRILLATOR UPGRADE N/A 02/05/2014    Procedure: BI-VENTRICULAR IMPLANTABLE CARDIOVERTER DEFIBRILLATOR UPGRADE;  Surgeon: Evans Lance, MD;  Location: [facility] CATH LAB;  Service: Cardiovascular;  Laterality: N/A;  . BIV ICD GENERTAOR CHANGE OUT   02/05/2014    Upgrade to Medtronic biventricular ICD, serial number  [id number] by Dr. [name]  . CARDIAC DEFIBRILLATOR PLACEMENT   2007     Medtronic Maximo VR, serial number [id number]  . INSERTION OF IMPLANTABLE LEFT VENTRICULAR ASSIST DEVICE N/A 08/16/2017    Procedure: INSERTION OF IMPLANTABLE LEFT VENTRICULAR ASSIST DEVICE-HM3;  Surgeon: Ivin Poot, MD;  Location: [place];  Service: Open Heart Surgery;  Laterality: N/A;  . IR FLUORO GUIDE CV LINE RIGHT   08/12/2017  . IR US GUIDE VASC ACCESS RIGHT   08/12/2017  . MEDIASTINAL EXPLORATION   08/17/2017    Procedure: MEDIASTINAL REXPLORATION with evacuation of hematoma;  Surgeon: Prescott Gum, Collier Salina, MD;  Location: [facility] OR;  Service: Open Heart Surgery;;  . PERCUTANEOUS CORONARY STENT INTERVENTION (PCI-S)   January 2002    PTCA/Stent Distal RCA  . PERCUTANEOUS CORONARY STENT INTERVENTION (PCI-S)   June 2002    PTCA/Stent x 3 RCA, thrombolysis - failed  . PERCUTANEOUS CORONARY STENT INTERVENTION (PCI-S)  July 2006    TAXUS stents to prox and distal LAD  . RIGHT HEART CATH N/A 07/22/2017    Procedure: RIGHT HEART CATH;  Surgeon: Jolaine Artist, MD;  Location: [place] CV LAB;  Service: Cardiovascular;  Laterality: N/A;  . RIGHT HEART CATH N/A 08/13/2017    Procedure: RIGHT HEART CATH - swan;  Surgeon: Jolaine Artist, MD;  Location: [place] CV LAB;  Service: Cardiovascular;  Laterality: N/A;  . RIGHT/LEFT HEART CATH AND CORONARY ANGIOGRAPHY N/A 02/18/2017    Procedure: RIGHT/LEFT HEART CATH AND CORONARY  ANGIOGRAPHY;  Surgeon: Jolaine Artist, MD;  Location: [place] CV LAB;  Service: Cardiovascular;  Laterality: N/A;  . STERNAL CLOSURE N/A 08/19/2017    Procedure: STERNAL CLOSURE;  Surgeon: Ivin Poot, MD;  Location: [place];  Service: Thoracic;  Laterality: N/A;  . TEE WITHOUT CARDIOVERSION N/A 08/16/2017    Procedure: TRANSESOPHAGEAL ECHOCARDIOGRAM (TEE);  Surgeon: Prescott Gum, Collier Salina, MD;  Location: [place];  Service: Open Heart Surgery;  Laterality: N/A;  . TEE WITHOUT CARDIOVERSION N/A 08/19/2017    Procedure: TRANSESOPHAGEAL ECHOCARDIOGRAM (TEE);  Surgeon: Prescott Gum, Collier Salina, MD;  Location: [place];  Service: Thoracic;  Laterality: N/A;  . TRICUSPID VALVE REPLACEMENT N/A 08/16/2017    Procedure: TRICUSPID VALVE REPAIR using Oletta Lamas MC3 Ring size 30;  Surgeon: Ivin Poot, MD;  Location: [place];  Service: Open Heart Surgery;  Laterality: N/A;         Family History  Problem Relation Age of Onset  . Heart failure Father          Deceased  . Alzheimer's disease Mother          Living  . Heart attack Mother    . CAD Brother    . Other Brother          blood cancer  . CAD Brother    . Prostate cancer Brother    . CAD Brother    . CAD Brother    . Healthy Son    . Healthy Daughter    . Diabetes Brother    . Stomach cancer Neg Hx    . Colon cancer Neg Hx      Social History:  reports that he quit smoking about 17 years ago. His smoking use included cigarettes. He has a 29.00 pack-year smoking history. He has never used smokeless tobacco. He reports that he does not drink alcohol or use drugs. Allergies:       Allergies  Allergen Reactions  . Penicillins Hives, Itching and Other (See Comments)      Has patient had a PCN reaction causing immediate rash, facial/tongue/throat swelling, SOB or lightheadedness with hypotension:# # Yes # # Has patient had a PCN reaction causing severe rash involving mucus membranes or skin necrosis: No Has patient had a PCN reaction that required  hospitalization: No Has patient had a PCN reaction occurring within the last 10 years: No If all of the above answers are "NO", then may proceed with Cephalosporin use.            Medications Prior to Admission  Medication Sig Dispense Refill  . acetaminophen (TYLENOL) 500 MG tablet Take 1,000 mg by mouth every 6 (six) hours as needed for moderate pain or headache.      Marland Kitchen apixaban (ELIQUIS) 5 MG TABS tablet Take 1 tablet (5 mg total) by mouth 2 (two) times daily. 60 tablet 6  . atorvastatin (LIPITOR) 80 MG tablet TAKE ONE  TABLET BY MOUTH ONCE DAILY (Patient taking differently: TAKE 80 MG BY MOUTH ONCE DAILY) 90 tablet 3  . digoxin (LANOXIN) 0.125 MG tablet Take 1 tablet (0.125 mg total) by mouth daily. (Patient taking differently: Take 0.125 mg by mouth at bedtime. ) 90 tablet 3  . Dulaglutide (TRULICITY) 1.5 CV/8.9FY SOPN Inject contents of pen weekly (Patient taking differently: Inject 1.5 mg into the skin every Friday. ) 4 pen 2  . gabapentin (NEURONTIN) 300 MG capsule Take 1 capsule (300 mg total) by mouth 3 (three) times daily. 270 capsule 3  . insulin NPH-regular Human (NOVOLIN 70/30 RELION) (70-30) 100 UNIT/ML injection Inject 30 Units into the skin 2 (two) times daily with a meal. 10 mL 12  . losartan (COZAAR) 25 MG tablet Take 12.5 mg by mouth at bedtime.      . magnesium oxide (MAG-OX) 400 MG tablet Take 1 tablet (400 mg total) by mouth daily. 90 tablet 3  . metFORMIN (GLUCOPHAGE-XR) 750 MG 24 hr tablet TAKE 2 TABLETS BY MOUTH ONCE DAILY WITH BREAKFAST 180 tablet 0  . Multiple Vitamins-Minerals (MULTIVITAMIN PO) Take 1 capsule by mouth daily.       . sotalol (BETAPACE) 120 MG tablet TAKE ONE TABLET BY MOUTH TWICE DAILY (Patient taking differently: TAKE 120MG BY MOUTH TWICE DAILY) 180 tablet 3  . torsemide (DEMADEX) 100 MG tablet Take 50 mg by mouth See admin instructions. Take 50 mg by mouth daily except DO NOT TAKE on Sunday          Home: [place] expects to be  discharged to:: Private residence Living Arrangements: Spouse/significant other Type of Home: Financial controller: Standard Home Equipment: Careers adviser History: Prior Function Level of Independence: Independent Comments: drives, cooks, works as a Engineer, maintenance (IT) Status:  Mobility: [place] bed mobility: Needs Assistance Bed Mobility: Rolling, Sidelying to Sit, Sit to Sidelying Rolling: Max assist, +2 for physical assistance Sidelying to sit: Max assist, +2 for physical assistance Sit to sidelying: Max assist, +2 for physical assistance General bed mobility comments: physical assist to bend knees and roll bil for positioning, assist to elevate trunk and bring legs off of and onto bed Transfers Overall transfer level: Needs assistance Equipment used: 2 person hand held assist Transfers: Sit to/from Stand Sit to Stand: Max assist, +2 physical assistance General transfer comment: bil knees blocked with assist of belt and pad for anterior translation and rise. Pt able to tolerate bil LE weight bearing but increased time and difficulty to extend trunk. Pt with weight off sacrum grossly 40 sec Ambulation/Gait General Gait Details: unable   ADL: ADL Overall ADL's : Needs assistance/impaired Eating/Feeding: NPO Grooming: Wash/dry hands, Wash/dry face, Oral care, Brushing hair, Total assistance, Sitting Upper Body Bathing: With caregiver independent assisting, Sitting, Bed level Lower Body Bathing: Total assistance, Bed level Upper Body Dressing : Total assistance, Sitting Lower Body Dressing: Total assistance, Bed level, Sit to/from stand Toilet Transfer: Total assistance Toilet Transfer Details (indicate cue type and reason): uanble to attempt  Toileting- Clothing Manipulation and Hygiene: Total assistance, Bed level Functional mobility during ADLs: Maximal assistance, +2 for physical assistance   Cognition: Cognition Overall Cognitive Status:  Impaired/Different from baseline Orientation Level: Oriented X4 Cognition Arousal/Alertness: Lethargic Behavior During Therapy: Flat affect Overall Cognitive Status: Impaired/Different from baseline Area of Impairment: Orientation, Memory, Attention, Following commands, Safety/judgement, Problem solving Orientation Level: Disoriented to, Time, Situation Current Attention Level: Sustained Memory: Decreased recall of precautions, Decreased short-term memory Following  Commands: Follows one step commands inconsistently, Follows one step commands with increased time Safety/Judgement: Decreased awareness of safety, Decreased awareness of deficits Problem Solving: Slow processing, Decreased initiation, Difficulty sequencing, Requires verbal cues, Requires tactile cues General Comments: pt with difficulty maintaining eyes open, stated he was in the hospital for a transplant, unaware of controller, unable to maintain or correct sitting balance with cues   Blood pressure 96/60, pulse 88, temperature (!) 96.3 F (35.7 C), temperature source Axillary, resp. rate (!) 27, height '5\' 11"'  (1.803 m), weight 102.1 kg (225 lb 1.4 oz), SpO2 91 %. Physical Exam  Vitals reviewed. Constitutional: He appears well-developed and well-nourished.  HENT:  Head: Normocephalic and atraumatic.  Eyes: EOM are normal. Right eye exhibits no discharge. Left eye exhibits no discharge.  Neck: Normal range of motion. Neck supple.  Cardiovascular:  Irregularly irregular  Respiratory: Breath sounds normal.  + Tachypnea  GI: Soft. Bowel sounds are normal.  Musculoskeletal:  Bilateral lower extremity edema Tremors noted Motor: Grossly 3+/5 throughout  Neurological: He is alert.  Skin: Skin is warm and dry.  Psychiatric: His affect is blunt. His speech is delayed. He is slowed.      Lab Results Last 24 Hours       Results for orders placed or performed during the hospital encounter of 08/11/17 (from the past 24 hour(s))   Glucose, capillary     Status: Abnormal    Collection Time: 08/28/17 11:39 PM  Result Value Ref Range    Glucose-Capillary 168 (H) 70 - 99 mg/dL    Comment 1 Venous Specimen    Cooxemetry Panel (carboxy, met, total hgb, O2 sat)     Status: Abnormal    Collection Time: 08/29/17  3:50 AM  Result Value Ref Range    Total hemoglobin 9.6 (L) 12.0 - 16.0 g/dL    O2 Saturation 63.1 %    Carboxyhemoglobin 1.4 0.5 - 1.5 %    Methemoglobin 1.6 (H) 0.0 - 1.5 %  Glucose, capillary     Status: Abnormal    Collection Time: 08/29/17  3:54 AM  Result Value Ref Range    Glucose-Capillary 136 (H) 70 - 99 mg/dL    Comment 1 Venous Specimen    Lactate dehydrogenase     Status: Abnormal    Collection Time: 08/29/17  4:00 AM  Result Value Ref Range    LDH 269 (H) 98 - 192 U/L  Protime-INR     Status: Abnormal    Collection Time: 08/29/17  4:00 AM  Result Value Ref Range    Prothrombin Time 29.8 (H) 11.4 - 15.2 seconds    INR 2.87    Magnesium     Status: Abnormal    Collection Time: 08/29/17  4:00 AM  Result Value Ref Range    Magnesium 2.6 (H) 1.7 - 2.4 mg/dL  Comprehensive metabolic panel     Status: Abnormal    Collection Time: 08/29/17  4:00 AM  Result Value Ref Range    Sodium 137 135 - 145 mmol/L    Potassium 4.1 3.5 - 5.1 mmol/L    Chloride 94 (L) 98 - 111 mmol/L    CO2 30 22 - 32 mmol/L    Glucose, Bld 163 (H) 70 - 99 mg/dL    BUN 58 (H) 8 - 23 mg/dL    Creatinine, Ser 1.67 (H) 0.61 - 1.24 mg/dL    Calcium 9.2 8.9 - 10.3 mg/dL    Total Protein 7.2 6.5 - 8.1 g/dL  Albumin 2.3 (L) 3.5 - 5.0 g/dL    AST 133 (H) 15 - 41 U/L    ALT 130 (H) 0 - 44 U/L    Alkaline Phosphatase 83 38 - 126 U/L    Total Bilirubin 1.8 (H) 0.3 - 1.2 mg/dL    GFR calc non Af Amer 41 (L) >60 mL/min    GFR calc Af Amer 47 (L) >60 mL/min    Anion gap 13 5 - 15  Glucose, capillary     Status: Abnormal    Collection Time: 08/29/17  8:04 AM  Result Value Ref Range    Glucose-Capillary 141 (H) 70 - 99 mg/dL     Comment 1 Notify RN    Glucose, capillary     Status: Abnormal    Collection Time: 08/29/17 12:27 PM  Result Value Ref Range    Glucose-Capillary 202 (H) 70 - 99 mg/dL    Comment 1 Notify RN    Glucose, capillary     Status: Abnormal    Collection Time: 08/29/17  8:03 PM  Result Value Ref Range    Glucose-Capillary 158 (H) 70 - 99 mg/dL    Comment 1 Notify RN    Glucose, capillary     Status: Abnormal    Collection Time: 08/29/17  9:15 PM  Result Value Ref Range    Glucose-Capillary 125 (H) 70 - 99 mg/dL    Comment 1 Venous Specimen         Imaging Results (Last 48 hours)  Dg Chest Port 1 View   Result Date: 08/29/2017 CLINICAL DATA:  LVAD present EXAM: PORTABLE CHEST - 1 VIEW COMPARISON:  the previous day's study FINDINGS: Stable left subclavian AICD, right IJ central venous catheter, right arm PICC, and LVAD. Slight improvement in the patchy interstitial airspace opacities throughout both lungs. Left retrocardiac consolidation/atelectasis persists. Previous median sternotomy and CABG. Heart size upper limits normal for technique. Probable left pleural effusion. No pneumothorax. IMPRESSION: 1. Partial improvement in bilateral infiltrates or edema. 2.  Support hardware stable in position. 3. Probable left pleural effusion. Electronically Signed   By: Lucrezia Europe M.D.   On: 08/29/2017 08:48    Dg Chest Port 1 View   Result Date: 08/28/2017 CLINICAL DATA:  Respiratory failure EXAM: PORTABLE CHEST 1 VIEW COMPARISON:  Yesterday FINDINGS: Increased airspace opacity on the right more than left. Low volumes with probable left pleural effusion. There is a right PICC with tip at the upper cavoatrial junction. Right IJ line with tip at the SVC. Biventricular pacer and AICD. No pneumothorax. Stable cardiopericardial enlargement. Status post extubation. A right IJ sheath has been removed. IMPRESSION: 1. Increased airspace opacity favoring edema. There has been interval extubation and atelectasis may  contribute. 2. Remaining hardware in stable position. Electronically Signed   By: Monte Fantasia M.D.   On: 08/28/2017 07:45    Dg Swallowing Func-speech Pathology   Result Date: 08/29/2017 Objective Swallowing Evaluation: Type of Study: MBS-Modified Barium Swallow Study  Patient Details Name: RUHAN BORAK MRN: [id number] Date of Birth: 08/13/1950 Today's Date: 08/29/2017 Time: SLP Start Time (ACUTE ONLY): 1325 -SLP Stop Time (ACUTE ONLY): 1350 SLP Time Calculation (min) (ACUTE ONLY): 25 min Past Medical History: Past Medical History: Diagnosis Date . AICD (automatic cardioverter/defibrillator) present 02/05/2014  Upgrade to Medtronic biventricular ICD, serial number  [id number]  . Atrial flutter ([place]) 04/2012  s/p TEE-EPS+RFCA 04/2012 . CAD (coronary artery disease) 5537,4827 X 2   RCA-T, 70% PL (off CFX), 99% Prox  LAD/90% Dist LAD, S/P TAXUS stent x 2 . CHF (congestive heart failure) ([place])  . Chronic anticoagulation  . Chronic systolic heart failure ([place])  . CKD (chronic kidney disease)  . Diabetic retinopathy ([place])  . DM type 2 (diabetes mellitus, type 2) (HCC)   insulin dependent . HTN (hypertension)  . Hypercholesteremia   ablation . ICD (implantable cardiac defibrillator) in place  . Ischemic cardiomyopathy March 2015  20-25% 2D  . Nephrolithiasis  . Ventricular tachycardia [facility])  Past Surgical History: Past Surgical History: Procedure Laterality Date . ATRIAL FLUTTER ABLATION N/A 05/19/2012  Procedure: ATRIAL FLUTTER ABLATION;  Surgeon: Thompson Grayer, MD;  [facility] CATH LAB;  Service: Cardiovascular;  Laterality: N/A; . BI-VENTRICULAR IMPLANTABLE CARDIOVERTER DEFIBRILLATOR UPGRADE N/A 02/05/2014  Procedure: BI-VENTRICULAR IMPLANTABLE CARDIOVERTER DEFIBRILLATOR UPGRADE;  Surgeon: Evans Lance, MD;  Location: [facility] CATH LAB;  Service: Cardiovascular;  Laterality: N/A; . BIV ICD GENERTAOR CHANGE OUT  02/05/2014  Upgrade to Medtronic biventricular ICD, serial number  [id number] by Dr. [name] . CARDIAC  DEFIBRILLATOR PLACEMENT  2007   Medtronic Maximo VR, serial number [id number] . INSERTION OF IMPLANTABLE LEFT VENTRICULAR ASSIST DEVICE N/A 08/16/2017  Procedure: INSERTION OF IMPLANTABLE LEFT VENTRICULAR ASSIST DEVICE-HM3;  Surgeon: Ivin Poot, MD;  Location: [place];  Service: Open Heart Surgery;  Laterality: N/A; . IR FLUORO GUIDE CV LINE RIGHT  08/12/2017 . IR US GUIDE VASC ACCESS RIGHT  08/12/2017 . MEDIASTINAL EXPLORATION  08/17/2017  Procedure: MEDIASTINAL REXPLORATION with evacuation of hematoma;  Surgeon: Prescott Gum, Collier Salina, MD;  Location: [facility] OR;  Service: Open Heart Surgery;; . PERCUTANEOUS CORONARY STENT INTERVENTION (PCI-S)  January 2002  PTCA/Stent Distal RCA . PERCUTANEOUS CORONARY STENT INTERVENTION (PCI-S)  June 2002  PTCA/Stent x 3 RCA, thrombolysis - failed . PERCUTANEOUS CORONARY STENT INTERVENTION (PCI-S)  July 2006  TAXUS stents to prox and distal LAD . RIGHT HEART CATH N/A 07/22/2017  Procedure: RIGHT HEART CATH;  Surgeon: Jolaine Artist, MD;  Location: [place] CV LAB;  Service: Cardiovascular;  Laterality: N/A; . RIGHT HEART CATH N/A 08/13/2017  Procedure: RIGHT HEART CATH - swan;  Surgeon: Jolaine Artist, MD;  [place] CV LAB;  Service: Cardiovascular;  Laterality: N/A; . RIGHT/LEFT HEART CATH AND CORONARY ANGIOGRAPHY N/A 02/18/2017  Procedure: RIGHT/LEFT HEART CATH AND CORONARY ANGIOGRAPHY;  Surgeon: Jolaine Artist, MD;  Location: [place] CV LAB;  Service: Cardiovascular;  Laterality: N/A; . STERNAL CLOSURE N/A 08/19/2017  Procedure: STERNAL CLOSURE;  Surgeon: Ivin Poot, MD;  Location: [place];  Service: Thoracic;  Laterality: N/A; . TEE WITHOUT CARDIOVERSION N/A 08/16/2017  Procedure: TRANSESOPHAGEAL ECHOCARDIOGRAM (TEE);  Surgeon: Prescott Gum, Collier Salina, MD;  Location: [place];  Service: Open Heart Surgery;  Laterality: N/A; . TEE WITHOUT CARDIOVERSION N/A 08/19/2017  Procedure: TRANSESOPHAGEAL ECHOCARDIOGRAM (TEE);  Surgeon: Prescott Gum, Collier Salina, MD;  Location: [place];  Service: Thoracic;  Laterality: N/A; . TRICUSPID VALVE REPLACEMENT N/A 08/16/2017  Procedure: TRICUSPID VALVE REPAIR using Oletta Lamas MC3 Ring size 30;  Surgeon: Ivin Poot, MD;  Location: [place];  Service: Open Heart Surgery;  Laterality: N/A; HPI: 67 y.o. 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  Subjective: Pt arrives with RN Assessment / Plan / Recommendation CHL IP CLINICAL IMPRESSIONS 08/29/2017 Clinical Impression Patient presents with moderate oral and severe pharyngeal dysphagia with gross, silent aspiration  of thin, nectar and honey thick liquids. Suspect pt's dysphagia is at least in part acute, reversible with additional time post-extubation and with general strengthening. Oral stage is characterized by prolonged oral manipulation, decreased anterior to posterior transit and weak lingual manipulation. Premature spillage with liquids, decreased hyolarygneal excursion and delayed closure of the laryngeal vestibule lead to moderate silent aspiration before the swallow with thin, nectar and honey liquids. Cued cough ineffective. Chin tuck provided full airway protection with honey thick liquids, and prevented aspiration but not penetration with nectar. Moderate retention of soft solid in the valleculae, with slow, incomplete clearance through the cervical esophagus, with backflow noted. Recommend dys 1, honey thick liquids with full supervision/cuing for chin tuck with liquids. Will follow up for tolerance, repeat instrumental study (FEES may give better perspective of glottal closure) when appropriate.  SLP Visit Diagnosis Dysphagia, oral phase (R13.11);Dysphagia, pharyngeal phase (R13.13);Dysphagia, pharyngoesophageal phase (R13.14) Attention and concentration deficit following -- Frontal lobe and executive function deficit following -- Impact on safety and function  Moderate aspiration risk   CHL IP TREATMENT RECOMMENDATION 08/29/2017 Treatment Recommendations Therapy as outlined in treatment plan below;F/U FEES in --- days (Comment);F/U MBS in --- days (Comment)   Prognosis 08/29/2017 Prognosis for Safe Diet Advancement Good Barriers to Reach Goals -- Barriers/Prognosis Comment -- CHL IP DIET RECOMMENDATION 08/29/2017 SLP Diet Recommendations Dysphagia 1 (Puree) solids;Honey thick liquids Liquid Administration via Cup;Spoon Medication Administration Crushed with puree Compensations Chin tuck;Slow rate;Small sips/bites;Minimize environmental distractions Postural Changes Remain semi-upright after after feeds/meals (Comment);Seated upright at 90 degrees   CHL IP OTHER RECOMMENDATIONS 08/29/2017 Recommended Consults -- Oral Care Recommendations Oral care BID Other Recommendations Remove water pitcher;Have oral suction available;Prohibited food (jello, ice cream, thin soups);Order thickener from pharmacy   CHL IP FOLLOW UP RECOMMENDATIONS 08/29/2017 Follow up Recommendations Inpatient Rehab   CHL IP FREQUENCY AND DURATION 08/29/2017 Speech Therapy Frequency (ACUTE ONLY) min 2x/week Treatment Duration 2 weeks      CHL IP ORAL PHASE 08/29/2017 Oral Phase Impaired Oral - Pudding Teaspoon -- Oral - Pudding Cup -- Oral - Honey Teaspoon Weak lingual manipulation;Reduced posterior propulsion;Delayed oral transit;Premature spillage Oral - Honey Cup Weak lingual manipulation;Reduced posterior propulsion;Delayed oral transit;Premature spillage Oral - Nectar Teaspoon Weak lingual manipulation;Reduced posterior propulsion;Delayed oral transit;Premature spillage Oral - Nectar Cup -- Oral - Nectar Straw -- Oral - Thin Teaspoon Weak lingual manipulation;Delayed oral transit;Premature spillage;Reduced posterior propulsion Oral - Thin Cup Weak lingual manipulation;Reduced posterior propulsion;Delayed oral transit;Premature spillage Oral - Thin Straw -- Oral - Puree Weak lingual manipulation;Reduced  posterior propulsion;Delayed oral transit Oral - Mech Soft Impaired mastication;Weak lingual manipulation;Reduced posterior propulsion;Delayed oral transit;Lingual/palatal residue Oral - Regular -- Oral - Multi-Consistency -- Oral - Pill -- Oral Phase - Comment --  CHL IP PHARYNGEAL PHASE 08/29/2017 Pharyngeal Phase Impaired Pharyngeal- Pudding Teaspoon -- Pharyngeal -- Pharyngeal- Pudding Cup -- Pharyngeal -- Pharyngeal- Honey Teaspoon Delayed swallow initiation-pyriform sinuses;Reduced anterior laryngeal mobility;Reduced laryngeal elevation;Reduced airway/laryngeal closure;Penetration/Aspiration before swallow;Moderate aspiration;Compensatory strategies attempted (with notebox) Pharyngeal Material enters airway, passes BELOW cords without attempt by patient to eject out (silent aspiration) Pharyngeal- Honey Cup Delayed swallow initiation-pyriform sinuses;Reduced anterior laryngeal mobility;Reduced laryngeal elevation;Reduced airway/laryngeal closure;Penetration/Aspiration before swallow;Moderate aspiration;Compensatory strategies attempted (with notebox) Pharyngeal Material enters airway, passes BELOW cords without attempt by patient to eject out (silent aspiration) Pharyngeal- Nectar Teaspoon Delayed swallow initiation-pyriform sinuses;Reduced anterior laryngeal mobility;Reduced laryngeal elevation;Reduced airway/laryngeal closure;Penetration/Aspiration before swallow;Moderate aspiration;Compensatory strategies attempted (with notebox) Pharyngeal Material enters airway, passes BELOW cords without attempt by patient to eject out (silent  aspiration) Pharyngeal- Nectar Cup -- Pharyngeal -- Pharyngeal- Nectar Straw -- Pharyngeal -- Pharyngeal- Thin Teaspoon Delayed swallow initiation-pyriform sinuses;Reduced anterior laryngeal mobility;Reduced laryngeal elevation;Reduced airway/laryngeal closure;Penetration/Aspiration before swallow;Moderate aspiration Pharyngeal Material enters airway, passes BELOW cords without  attempt by patient to eject out (silent aspiration) Pharyngeal- Thin Cup Delayed swallow initiation-pyriform sinuses;Reduced anterior laryngeal mobility;Reduced laryngeal elevation;Reduced airway/laryngeal closure;Penetration/Aspiration before swallow;Moderate aspiration Pharyngeal Material enters airway, passes BELOW cords without attempt by patient to eject out (silent aspiration) Pharyngeal- Thin Straw -- Pharyngeal -- Pharyngeal- Puree Delayed swallow initiation-vallecula;Reduced anterior laryngeal mobility;Reduced laryngeal elevation Pharyngeal -- Pharyngeal- Mechanical Soft Delayed swallow initiation-vallecula;Reduced anterior laryngeal mobility;Reduced laryngeal elevation;Reduced tongue base retraction;Pharyngeal residue - valleculae;Pharyngeal residue - cp segment Pharyngeal -- Pharyngeal- Regular -- Pharyngeal -- Pharyngeal- Multi-consistency -- Pharyngeal -- Pharyngeal- Pill -- Pharyngeal -- Pharyngeal Comment --  CHL IP CERVICAL ESOPHAGEAL PHASE 08/29/2017 Cervical Esophageal Phase Impaired Pudding Teaspoon -- Pudding Cup -- Honey Teaspoon Esophageal backflow into cervical esophagus;Reduced cricopharyngeal relaxation Honey Cup -- Nectar Teaspoon -- Nectar Cup -- Nectar Straw -- Thin Teaspoon -- Thin Cup -- Thin Straw -- Puree -- Mechanical Soft Reduced cricopharyngeal relaxation;Esophageal backflow into cervical esophagus Regular -- Multi-consistency -- Pill -- Cervical Esophageal Comment -- Deneise Lever, MS, CCC-SLP Speech-Language Pathologist [phone] No flowsheet data found. Aliene Altes 08/29/2017, 3:16 PM                  Assessment/Plan: Diagnosis: Debility Labs independently reviewed.  Records reviewed and summated above.   1. Does the need for close, 24 hr/day medical supervision in concert with the patient's rehab needs make it unreasonable for this patient to be served in a less intensive setting? Yes  2. Co-Morbidities requiring supervision/potential complications: ventricular  tachycardia (monitor heart rate with increased physical activity), ischemic cardiomyopathy status post ICD, HTN (monitor and provide prns in accordance with increased physical exertion and pain), diabetes mellitus type 2 with retinopathy (Monitor in accordance with exercise and adjust meds as necessary), CKD (avoid nephrotoxic meds) , chronic systolic congestive heart failure (Monitor in accordance with increased physical activity and avoid UE resistance excercises), CAD (continue meds), atrial flutter (monitor heart rate with increased physical activity), cardiogenic shock (wean pressors when appropriate), respiratory failure, V. tach/V. Fi (monitor heart rate), tachypnea (monitor RR and O2 Sats with increased physical exertion), leukocytosis (cont to monitor for signs and symptoms of infection, further workup if indicated), acute blood loss anemia (transfuse if necessary to ensure appropriate perfusion for increased activity tolerance) 3. Due to bladder management, safety, skin/wound care, disease management, medication administration, pain management and patient education, does the patient require 24 hr/day rehab nursing? Yes 4. Does the patient require coordinated care of a physician, rehab nurse, PT (1-2 hrs/day, 5 days/week), OT (1-2 hrs/day, 5 days/week) and SLP (1-2 hrs/day, 5 days/week) to address physical and functional deficits in the context of the above medical diagnosis(es)? Yes Addressing deficits in the following areas: balance, endurance, locomotion, strength, transferring, bowel/bladder control, bathing, dressing, feeding, toileting, cognition, speech, swallowing and psychosocial support 5. Can the patient actively participate in an intensive therapy program of at least 3 hrs of therapy per day at least 5 days per week? Potentially 6. The potential for patient to make measurable gains while on inpatient rehab is excellent 7. Anticipated functional outcomes upon discharge from inpatient rehab  are min assist and mod assist  with PT, min assist and mod assist with OT, supervision with SLP. 8. Estimated rehab length of stay to reach the above functional goals  is: 18-22 days. 9. Anticipated D/C setting: Home 10. Anticipated post D/C treatments: HH therapy and Home excercise program 11. Overall Rehab/Functional Prognosis: good   RECOMMENDATIONS: This patient's condition is appropriate for continued rehabilitative care in the following setting: CIR when medically stable unable to tolerate 3 hours of therapy a day. Patient has agreed to participate in recommended program. Yes Note that insurance prior authorization may be required for reimbursement for recommended care.   Comment: Rehab Admissions Coordinator to follow up.   Delice Lesch, MD, ABPMR 08/29/2017          Routing History

## 2017-09-10 NOTE — Progress Notes (Signed)
Received pt. As a new admission.Pt. And his wife has been oriented to the unit protocol.Safety plan was explained as well as the fall prevention plan.

## 2017-09-11 ENCOUNTER — Inpatient Hospital Stay (HOSPITAL_COMMUNITY): Payer: PPO | Admitting: Speech Pathology

## 2017-09-11 ENCOUNTER — Inpatient Hospital Stay (HOSPITAL_COMMUNITY): Payer: PPO | Admitting: Physical Therapy

## 2017-09-11 ENCOUNTER — Inpatient Hospital Stay (HOSPITAL_COMMUNITY): Payer: PPO | Admitting: Occupational Therapy

## 2017-09-11 DIAGNOSIS — E119 Type 2 diabetes mellitus without complications: Secondary | ICD-10-CM

## 2017-09-11 DIAGNOSIS — I5022 Chronic systolic (congestive) heart failure: Secondary | ICD-10-CM

## 2017-09-11 DIAGNOSIS — L89152 Pressure ulcer of sacral region, stage 2: Secondary | ICD-10-CM

## 2017-09-11 LAB — COMPREHENSIVE METABOLIC PANEL
ALK PHOS: 77 U/L (ref 38–126)
ALT: 24 U/L (ref 0–44)
ANION GAP: 10 (ref 5–15)
AST: 26 U/L (ref 15–41)
Albumin: 2.4 g/dL — ABNORMAL LOW (ref 3.5–5.0)
BILIRUBIN TOTAL: 1 mg/dL (ref 0.3–1.2)
BUN: 20 mg/dL (ref 8–23)
CALCIUM: 8.7 mg/dL — AB (ref 8.9–10.3)
CO2: 26 mmol/L (ref 22–32)
Chloride: 100 mmol/L (ref 98–111)
Creatinine, Ser: 1.28 mg/dL — ABNORMAL HIGH (ref 0.61–1.24)
GFR calc non Af Amer: 56 mL/min — ABNORMAL LOW (ref >60)
GLUCOSE: 124 mg/dL — AB (ref 70–99)
Potassium: 3.9 mmol/L (ref 3.5–5.1)
Sodium: 136 mmol/L (ref 135–145)
TOTAL PROTEIN: 7.7 g/dL (ref 6.5–8.1)

## 2017-09-11 LAB — CBC WITH DIFFERENTIAL/PLATELET
ABS IMMATURE GRANULOCYTES: 0 10*3/uL (ref 0.0–0.1)
Basophils Absolute: 0 10*3/uL (ref 0.0–0.1)
Basophils Relative: 0 %
Eosinophils Absolute: 0.1 10*3/uL (ref 0.0–0.7)
Eosinophils Relative: 1 %
HCT: 31 % — ABNORMAL LOW (ref 39.0–52.0)
HEMOGLOBIN: 9.2 g/dL — AB (ref 13.0–17.0)
IMMATURE GRANULOCYTES: 1 %
Lymphocytes Relative: 17 %
Lymphs Abs: 1 10*3/uL (ref 0.7–4.0)
MCH: 24.9 pg — AB (ref 26.0–34.0)
MCHC: 29.7 g/dL — ABNORMAL LOW (ref 30.0–36.0)
MCV: 84 fL (ref 78.0–100.0)
MONO ABS: 0.6 10*3/uL (ref 0.1–1.0)
MONOS PCT: 11 %
NEUTROS ABS: 4.1 10*3/uL (ref 1.7–7.7)
NEUTROS PCT: 70 %
PLATELETS: 294 10*3/uL (ref 150–400)
RBC: 3.69 MIL/uL — ABNORMAL LOW (ref 4.22–5.81)
RDW: 18.4 % — ABNORMAL HIGH (ref 11.5–15.5)
WBC: 5.7 10*3/uL (ref 4.0–10.5)

## 2017-09-11 LAB — GLUCOSE, CAPILLARY
GLUCOSE-CAPILLARY: 91 mg/dL (ref 70–99)
GLUCOSE-CAPILLARY: 107 mg/dL — AB (ref 70–99)
Glucose-Capillary: 122 mg/dL — ABNORMAL HIGH (ref 70–99)
Glucose-Capillary: 163 mg/dL — ABNORMAL HIGH (ref 70–99)

## 2017-09-11 LAB — PROTIME-INR
INR: 2.05
PROTHROMBIN TIME: 23 s — AB (ref 11.4–15.2)

## 2017-09-11 LAB — MAGNESIUM: MAGNESIUM: 2 mg/dL (ref 1.7–2.4)

## 2017-09-11 LAB — LACTATE DEHYDROGENASE: LDH: 196 U/L — ABNORMAL HIGH (ref 98–192)

## 2017-09-11 MED ORDER — TRAMADOL HCL 50 MG PO TABS: 50.0000 mg | ORAL_TABLET | Freq: Four times a day (QID) | ORAL | Status: DC | PRN

## 2017-09-11 MED ORDER — PREMIER PROTEIN SHAKE
11.0000 [oz_av] | Freq: Two times a day (BID) | ORAL | Status: DC
  Administered 2017-09-12 – 2017-09-25 (×20): 11 [oz_av] via ORAL
  Filled 2017-09-11 (×31): qty 325.31

## 2017-09-11 MED ORDER — WARFARIN SODIUM 4 MG PO TABS
4.0000 mg | ORAL_TABLET | Freq: Once | ORAL | Status: AC
  Administered 2017-09-11: 4 mg via ORAL
  Filled 2017-09-11: qty 1

## 2017-09-11 NOTE — Progress Notes (Signed)
[facility] for coumadin Indication: LVAD  Allergies  Allergen Reactions  . Penicillins Hives, Itching and Other (See Comments)    Has patient had a PCN reaction causing immediate rash, facial/tongue/throat swelling, SOB or lightheadedness with hypotension:# # Yes # # Has patient had a PCN reaction causing severe rash involving mucus membranes or skin necrosis: No Has patient had a PCN reaction that required hospitalization: No Has patient had a PCN reaction occurring within the last 10 years: No If all of the above answers are "NO", then may proceed with Cephalosporin use.     Patient Measurements: Height: 5\' 11"  (180.3 cm) Weight: 219 lb (99.3 kg) IBW/kg (Calculated) : 75.3 Heparin Dosing Weight: 98.3 kg  Vital Signs: Temp: 98.8 F (37.1 C) (07/13 0410) Temp Source: Oral (07/13 0410) BP: 92/74 (07/13 0445) Pulse Rate: 66 (07/13 0410)  Labs: Recent Labs    09/09/17 0500 09/10/17 0500 09/11/17 0306  HGB 9.1* 9.2* 9.2*  HCT 30.6* 30.1* 31.0*  PLT 326 274 294  LABPROT 22.2* 21.8* 23.0*  INR 1.97 1.92 2.05  CREATININE 1.11 1.19 1.28*    Estimated Creatinine Clearance: 67.2 mL/min (A) (by C-G formula based on SCr of 1.28 mg/dL (H)).   Medical History: Past Medical History:  Diagnosis Date  . AICD (automatic cardioverter/defibrillator) present 02/05/2014   Upgrade to Medtronic biventricular ICD, serial number  [id number]   . Atrial flutter ([place]) 04/2012   s/p TEE-EPS+RFCA 04/2012  . CAD (coronary artery disease) 7096,2836 X 2    RCA-T, 70% PL (off CFX), 99% Prox LAD/90% Dist LAD, S/P TAXUS stent x 2  . CHF (congestive heart failure) ([place])   . Chronic anticoagulation   . Chronic systolic heart failure ([place])   . CKD (chronic kidney disease)   . Diabetic retinopathy ([place])   . DM type 2 (diabetes mellitus, type 2) ([facility])    insulin dependent  . HTN (hypertension)   . Hypercholesteremia    ablation  . ICD (implantable cardiac  defibrillator) in place   . Ischemic cardiomyopathy March 2015   20-25% 2D   . Nephrolithiasis   . Ventricular tachycardia [facility])     Assessment: 57 yy.o M who underwent LVAD placement on 6/17 currently on warfarin per Rx.  Today, 09/11/2017: - INR is therapeutic at 2.05 - cbc stable - no bleeding documented - no significant drug-drug intxns  Goal of Therapy:  INR goal 2-2.5 Monitor platelets by anticoagulation protocol: Yes   Plan:   - Warfarin 4 mg tonight. - Monitor s/s bleeding - Will provide warfarin education prior to discharge  Dia Sitter, PharmD, BCPS 09/11/2017 9:03 AM

## 2017-09-11 NOTE — Progress Notes (Signed)
Patient ID: KEANTE URIZAR, male   DOB: 1950-08-13, 67 y.o.   MRN: [id number]     Advanced Heart Failure Rounding Note  PCP-Cardiologist: No primary care provider on file.   Subjective:    Events 6/17 Underwent HM-3 implant -> Unable to close chest with RV failure and high intrathoracic pressure 6/18 Taken back to OR for evacuation of hematoma. NO RVAD needed. Left sternum open  08/19/17 Taken back to OR for sternal closure 6/22 VT => amiodarone started.  6/24 VT/VF=>urgent cardioversion. 6/25 VT  =>urgent cardioversion  6/27 Extubated 7/12 to CIR  Stable today, feels weak with PT.   LVAD Interrogation HM 3: Speed: 5300 Flow: 4 PI: 4.7 Power: 3.8. VAD interrogated personally. Parameters stable.   Objective:   Weight Range: 219 lb (99.3 kg) Body mass index is 30.54 kg/m.   Vital Signs:   Temp:  [98 F (36.7 C)-99 F (37.2 C)] 98.8 F (37.1 C) (07/13 0410) Pulse Rate:  [66-108] 66 (07/13 0410) Resp:  [18-25] 18 (07/13 0410) BP: (82-103)/(64-85) 82/72 (07/13 1034) SpO2:  [93 %-100 %] 94 % (07/13 0410) Weight:  [219 lb (99.3 kg)-223 lb (101.2 kg)] 219 lb (99.3 kg) (07/13 0410) Last BM Date: 09/09/17   MAP 70s  Weight change: Filed Weights   09/10/17 1647 09/10/17 1850 09/11/17 0410  Weight: 223 lb (101.2 kg) 223 lb (101.2 kg) 219 lb (99.3 kg)   Intake/Output:   Intake/Output Summary (Last 24 hours) at 09/11/2017 1138 Last data filed at 09/11/2017 0900 Gross per 24 hour  Intake 640 ml  Output 2325 ml  Net -1685 ml   Physical Exam    Physical Exam: General: Well appearing this am. NAD.  HEENT: Normal. Neck: Supple, JVP 8 cm. Carotids OK.  Cardiac:  Mechanical heart sounds with LVAD hum present.  Lungs:  CTAB, normal effort.  Abdomen:  NT, ND, no HSM. No bruits or masses. +BS  LVAD exit site: Well-healed and incorporated. Dressing dry and intact. No erythema or drainage. Stabilization device present and accurately applied. Driveline dressing changed daily  per sterile technique. Extremities:  Warm and dry. No cyanosis, clubbing, rash, or edema.  Neuro:  Alert & oriented x 3. Cranial nerves grossly intact. Moves all 4 extremities w/o difficulty. Affect pleasant    Telemetry   Not on telemetry  Labs    CBC Recent Labs    09/10/17 0500 09/11/17 0306  WBC 6.0 5.7  NEUTROABS  --  4.1  HGB 9.2* 9.2*  HCT 30.1* 31.0*  MCV 83.6 84.0  PLT 274 784   Basic Metabolic Panel Recent Labs    09/10/17 0500 09/11/17 0306  NA 137 136  K 3.9 3.9  CL 101 100  CO2 25 26  GLUCOSE 125* 124*  BUN 19 20  CREATININE 1.19 1.28*  CALCIUM 8.8* 8.7*  MG 2.0 2.0   Liver Function Tests Recent Labs    09/10/17 0500 09/11/17 0306  AST 27 26  ALT 23 24  ALKPHOS 74 77  BILITOT 1.4* 1.0  PROT 7.4 7.7  ALBUMIN 2.3* 2.4*   No results for input(s): LIPASE, AMYLASE in the last 72 hours. Cardiac Enzymes No results for input(s): CKTOTAL, CKMB, CKMBINDEX, TROPONINI in the last 72 hours.  BNP: BNP (last 3 results) Recent Labs    08/23/17 0024 08/30/17 0000 09/05/17 2327  BNP 450.5* 114.6* 86.1    ProBNP (last 3 results) Recent Labs    11/12/16 1559  PROBNP 388.0*     D-Dimer No  results for input(s): DDIMER in the last 72 hours. Hemoglobin A1C No results for input(s): HGBA1C in the last 72 hours. Fasting Lipid Panel No results for input(s): CHOL, HDL, LDLCALC, TRIG, CHOLHDL, LDLDIRECT in the last 72 hours. Thyroid Function Tests No results for input(s): TSH, T4TOTAL, T3FREE, THYROIDAB in the last 72 hours.  Invalid input(s): FREET3  Other results:   Imaging    No results found.   Medications:     Scheduled Medications: . aspirin  81 mg Oral Daily  . bisacodyl  10 mg Oral Daily   Or  . bisacodyl  10 mg Rectal Daily  . Chlorhexidine Gluconate Cloth  6 each Topical QHS  . collagenase   Topical Daily  . famotidine  20 mg Oral BID  . insulin aspart  0-15 Units Subcutaneous TID WC  . insulin aspart  0-5 Units  Subcutaneous QHS  . insulin detemir  25 Units Subcutaneous QHS  . magnesium oxide  400 mg Oral BID  . mouth rinse  15 mL Mouth Rinse BID  . multivitamin with minerals  1 tablet Oral Daily  . nystatin  5 mL Oral QID  . potassium chloride  40 mEq Oral Daily  . protein supplement shake  11 oz Oral TID WC  . sodium chloride flush  10-40 mL Intracatheter Q12H  . spironolactone  25 mg Oral Daily  . torsemide  60 mg Oral Daily  . vitamin C  500 mg Oral BID  . warfarin  4 mg Oral ONCE-1800  . Warfarin - Pharmacist Dosing Inpatient   Does not apply q1800    Infusions: . lactated ringers      PRN Medications: acetaminophen, alum & mag hydroxide-simeth, bisacodyl, clonazepam, diphenhydrAMINE, guaiFENesin-dextromethorphan, lactated ringers, levalbuterol, polyethylene glycol, prochlorperazine **OR** prochlorperazine **OR** prochlorperazine, RESOURCE THICKENUP CLEAR, sodium chloride flush, sodium phosphate, traMADol, traZODone    Patient Profile  IKEY OMARY is a 67 y.o. 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   Directly admitted with persistent low cardiac output for milrinone initiation for home.  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 Acute/Chronic systolic CHF with biventricular failure-> cardiogenic shock: Echo  08/13/2017 EF 20-25%. s/p  Medtronic BiV ICD in place. Cath 12/18 with stable 1v CAD.  s/p HM-3 implant 6/17. Unable to close chest due to high-intrathoracic pressures and RV failure. Taken back to OR 08/17/17 with evacuation of hematoma with improvement. Extubated 6/28. Ramp ECHO 7/11 with speed increased to 5300. Volume looks ok.  - Continue torsemide 60 mg daily today.   - Continue spiro 25 mg daily.  2. 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 LDH 196. Ramp ECHO speed increased to 5300.  VAD interrogated personally. Parameters stable. - Continue  warfarin + ASA 81, INR 2. 3. 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  4. [name]/VF: Recurrent VT on 6/22.  S/P VF 6/24 emergent bedside cardioversion. S/P VT 6/26 emergent bedside cardioversion. ICD turned back on.   - Keep K > 4.0 Mg > 2.0.   5. AFL/atrial fibrillation: S/p previous ablation. He is currently in atrial fibrillation.  - Rate controlled. Amio stopped.  - on coumadin.  6. [name]: Recent cath with stable CAD as above: No chest pain.  7. [name]: Recent A1c 8.3 on 6/3.  -continue current regimen.  8.  Anemia: Post-op.  Got 1 unit PRBCs 6/22 and 6/25. - Stable. Hgb 9.2  9. Deconditioning: Severe, working with PT aggressively.   10. Unstageable Pressure Ulcer: R and L buttock.  R buttock with necrosis noted. Continue santyl for selective debridement. Change daily. Continue air overlay. Needs to reposition every hour when in the chair. Keep R /L in bed. WOC has seen. May need a different chair cushion. Add hydrotherapy.    Length of Stay: 1  Loralie Champagne, MD  09/11/2017, 11:38 AM  Advanced Heart Failure Team Pager [phone] (M-F; 7a - 4p)  Please contact [facility] for night-coverage after hours (4p -7a ) and weekends on [email or web address]

## 2017-09-11 NOTE — Progress Notes (Signed)
Patient received oxy 5mg  prior to hydrotherapy treatment this morning. Patient very drowsy and difficult time following directions back to bed for hydrotherapy this morning at 0830. Patient drowsiness better this afternoon and alert and oriented x4 this afternoon. Patient still reports some dizziness when sitting up and reports feeling "hungover" . MD Naaman Plummer notified of patient inability to do much therapy today. MD aware and oxy already discontinued earlier. Continue to monitor.

## 2017-09-11 NOTE — Plan of Care (Signed)
LTGs established 09/11/17

## 2017-09-11 NOTE — Evaluation (Signed)
Occupational Therapy Assessment and Plan  Patient Details  Name: [name] [name] MRN: [id number] Date of Birth: 12/15/1950  OT Diagnosis: altered mental status, cognitive deficits and muscle weakness (generalized) Rehab Potential:  Good ELOS: 14-16 days   Today's Date: 09/11/2017 OT Individual Time: 1300-1400 OT Individual Time Calculation (min): 60 min     Problem List:  Patient Active Problem List   Diagnosis Date Noted  . Debility 09/10/2017  . CHF (congestive heart failure) ([place])   . Type 2 diabetes mellitus with retinopathy, with long-term current use of insulin ([place])   . Stage 3 chronic kidney disease ([place])   . Cardiogenic shock ([place])   . Acute respiratory failure ([place])   . Tachypnea   . Leukocytosis   . Acute blood loss anemia   . Pressure injury of skin 08/27/2017  . Acute respiratory failure with hypoxemia ([place])   . Acute pulmonary edema (HCC)   . Metabolic alkalosis   . Presence of left ventricular assist device (LVAD) ([place])   . S/P TVR (tricuspid valve repair) 08/17/2017  . Goals of care, counseling/discussion   . Palliative care encounter   . Acute on chronic systolic (congestive) heart failure ([place]) 08/11/2017  . Acute on chronic combined systolic and diastolic CHF (congestive heart failure) ([place]) 11/19/2016  . Acute on chronic left and right systolic w/ diastolic heart failure, NYHA class 2 ([place]) 11/18/2016  . Hx of atrial flutter 11/18/2016  . CAD (coronary artery disease)-RCA-T, 70% PL (off CFX), 99% Prox LAD/90% Dist LAD, S/P TAXUS stent x 2 11/18/2016  . HTN (hypertension) 11/18/2016  . Diabetes mellitus with complication ([place]) 33/29/5188  . Thrombocytopenia ([place]) 11/18/2016  . Bilateral lower extremity edema 11/12/2016  . Diabetic polyneuropathy associated with diabetes mellitus due to underlying condition ([place]) 05/29/2015  . Chronic systolic heart failure ([place]) 02/05/2014  . Medtronic biventricular ICD, serial number  [id number]  02/05/2014  . CAD  (coronary artery disease), native coronary artery 01/08/2014  . Chronic renal disease, stage III ([place]) 01/08/2014  . Heart failure ([place]) 12/16/2013  . Mitral regurgitation 04/17/2013  . Chronic systolic dysfunction of left ventricle 06/29/2012  . Atrial flutter ([place]) 06/03/2012  . Acute kidney injury (nontraumatic) ([place]) 05/31/2012  . Chronic anticoagulation   . Acute on chronic systolic heart failure ([place]) 05/21/2012  . Nausea with vomiting 05/18/2012  . PAF (paroxysmal atrial fibrillation) ([place]) 05/17/2012  . Orthopnea 05/17/2012  . TIA (transient ischemic attack) 05/29/2010  . CHEST PAIN 11/13/2008  . VENTRICULAR TACHYCARDIA 08/14/2008  . Automatic implantable cardioverter-defibrillator in situ 08/14/2008  . Type 2 diabetes with nephropathy ([place]) 08/11/2008  . DYSLIPIDEMIA 08/11/2008  . Cardiomyopathy, ischemic 08/11/2008  . Disorder resulting from impaired renal function 08/11/2008  . HYPERCHOLESTEROLEMIA 04/29/2006  . HYPERTENSION, BENIGN SYSTEMIC 04/29/2006  . MYOCARDIAL INFARCTION, OLD 04/29/2006  . Coronary atherosclerosis 04/29/2006  . NEPHROLITHIASIS 04/29/2006    Past Medical History:  Past Medical History:  Diagnosis Date  . AICD (automatic cardioverter/defibrillator) present 02/05/2014   Upgrade to Medtronic biventricular ICD, serial number  [id number]   . Atrial flutter ([place]) 04/2012   s/p TEE-EPS+RFCA 04/2012  . CAD (coronary artery disease) 4166,0630 X 2    RCA-T, 70% PL (off CFX), 99% Prox LAD/90% Dist LAD, S/P TAXUS stent x 2  . CHF (congestive heart failure) ([place])   . Chronic anticoagulation   . Chronic systolic heart failure ([place])   . CKD (chronic kidney disease)   . Diabetic retinopathy ([place])   . DM type 2 (  diabetes mellitus, type 2) (HCC)    insulin dependent  . HTN (hypertension)   . Hypercholesteremia    ablation  . ICD (implantable cardiac defibrillator) in place   . Ischemic cardiomyopathy March 2015   20-25% 2D   . Nephrolithiasis   .  Ventricular tachycardia [facility])    Past Surgical History:  Past Surgical History:  Procedure Laterality Date  . ATRIAL FLUTTER ABLATION N/A 05/19/2012   Procedure: ATRIAL FLUTTER ABLATION;  Surgeon: Thompson Grayer, MD;  Location: [facility] CATH LAB;  Service: Cardiovascular;  Laterality: N/A;  . BI-VENTRICULAR IMPLANTABLE CARDIOVERTER DEFIBRILLATOR UPGRADE N/A 02/05/2014   Procedure: BI-VENTRICULAR IMPLANTABLE CARDIOVERTER DEFIBRILLATOR UPGRADE;  Surgeon: Evans Lance, MD;  Location: [facility] CATH LAB;  Service: Cardiovascular;  Laterality: N/A;  . BIV ICD GENERTAOR CHANGE OUT  02/05/2014   Upgrade to Medtronic biventricular ICD, serial number  [id number] by Dr. [name]  . CARDIAC DEFIBRILLATOR PLACEMENT  2007    Medtronic Maximo VR, serial number [id number]  . INSERTION OF IMPLANTABLE LEFT VENTRICULAR ASSIST DEVICE N/A 08/16/2017   Procedure: INSERTION OF IMPLANTABLE LEFT VENTRICULAR ASSIST DEVICE-HM3;  Surgeon: Ivin Poot, MD;  Location: [place];  Service: Open Heart Surgery;  Laterality: N/A;  . IR FLUORO GUIDE CV LINE RIGHT  08/12/2017  . IR US GUIDE VASC ACCESS RIGHT  08/12/2017  . MEDIASTINAL EXPLORATION  08/17/2017   Procedure: MEDIASTINAL REXPLORATION with evacuation of hematoma;  Surgeon: Prescott Gum, Collier Salina, MD;  Location: [facility] OR;  Service: Open Heart Surgery;;  . PERCUTANEOUS CORONARY STENT INTERVENTION (PCI-S)  January 2002   PTCA/Stent Distal RCA  . PERCUTANEOUS CORONARY STENT INTERVENTION (PCI-S)  June 2002   PTCA/Stent x 3 RCA, thrombolysis - failed  . PERCUTANEOUS CORONARY STENT INTERVENTION (PCI-S)  July 2006   TAXUS stents to prox and distal LAD  . RIGHT HEART CATH N/A 07/22/2017   Procedure: RIGHT HEART CATH;  Surgeon: Jolaine Artist, MD;  Location: [place] CV LAB;  Service: Cardiovascular;  Laterality: N/A;  . RIGHT HEART CATH N/A 08/13/2017   Procedure: RIGHT HEART CATH - swan;  Surgeon: Jolaine Artist, MD;  Location: [place] CV LAB;  Service: Cardiovascular;  Laterality:  N/A;  . RIGHT/LEFT HEART CATH AND CORONARY ANGIOGRAPHY N/A 02/18/2017   Procedure: RIGHT/LEFT HEART CATH AND CORONARY ANGIOGRAPHY;  Surgeon: Jolaine Artist, MD;  Location: [place] CV LAB;  Service: Cardiovascular;  Laterality: N/A;  . STERNAL CLOSURE N/A 08/19/2017   Procedure: STERNAL CLOSURE;  Surgeon: Ivin Poot, MD;  Location: [place];  Service: Thoracic;  Laterality: N/A;  . TEE WITHOUT CARDIOVERSION N/A 08/16/2017   Procedure: TRANSESOPHAGEAL ECHOCARDIOGRAM (TEE);  Surgeon: Prescott Gum, Collier Salina, MD;  Location: [place];  Service: Open Heart Surgery;  Laterality: N/A;  . TEE WITHOUT CARDIOVERSION N/A 08/19/2017   Procedure: TRANSESOPHAGEAL ECHOCARDIOGRAM (TEE);  Surgeon: Prescott Gum, Collier Salina, MD;  Location: [place];  Service: Thoracic;  Laterality: N/A;  . TRICUSPID VALVE REPLACEMENT N/A 08/16/2017   Procedure: TRICUSPID VALVE REPAIR using Oletta Lamas MC3 Ring size 30;  Surgeon: Ivin Poot, MD;  Location: [place];  Service: Open Heart Surgery;  Laterality: N/A;    Assessment & Plan Clinical Impression:   LEVERN PITTER is a 67 year old male with past medical history of ventricular tachycardia, ischemic cardiomyopathy status post ICD, hypertension, type 2 diabetes mellitus with retinopathy, CKD, chronic systolic heart failure, CAD, a flutter who presented on 08/11/2017 due to CHF. He was admitted with plans for home milrinone initiation however noted to  be fluid overloaded and ultimately had LVAD placed on 6/17 with hematoma evacuation on 8/18 and delayed closure 6/20. Hospital course significant for cardiogenic shock requiring pressors, VDRF--tolerated extubation by 6/28 V. tach/V. Fib treated with urgent cardioversion X 2, acute blood loss anemia, encephalopathy, leukocytosis and volume overload. Fluid overload improved with IV diuresis, milrinone weaned off and he has been transitioned to orals.  Fevers have resolved on meropenum and BC X 2 negative. ICD turned back on today. On dysphagia 2,  honey liquids with chin tuck due to dysphagia. Has had complaints of buttock pain and deep tissue injury bilateral buttocks showed minimal progress with santyl therefore hydrotherapy started today. Mentation and activity tolerance are improving but he continues to have decline in functional status. CIR recommended due to debility.   Patient currently requires mod with basic self-care skills secondary to muscle weakness, decreased cardiorespiratoy endurance, decreased initiation, decreased attention, decreased awareness, decreased problem solving, decreased safety awareness and decreased memory and decreased sitting balance and decreased standing balance.  Prior to hospitalization, patient could complete BADLs with independent .  Patient will benefit from skilled intervention to increase independence with basic self-care skills prior to discharge home with care partner.  Anticipate patient will require 24 hour supervision and minimal physical assistance and follow up home health.  OT - End of Session Endurance Deficit: Yes Endurance Deficit Description: dizziness and fatigue w/ all functional movements OT Assessment OT Barriers to Discharge: Wound Care;Medical stability OT Patient demonstrates impairments in the following area(s): Balance;Cognition;Safety;Endurance;Skin Integrity;Motor OT Basic ADL's Functional Problem(s): Grooming;Bathing;Dressing;Toileting OT Advanced ADL's Functional Problem(s): Simple Meal Preparation OT Transfers Functional Problem(s): Tub/Shower;Toilet OT Additional Impairment(s): None OT Plan OT Intensity: Minimum of 1-2 x/day, 45 to 90 minutes OT Frequency: 5 out of 7 days OT Duration/Estimated Length of Stay: 14-16 days OT Treatment/Interventions: Balance/vestibular training;Discharge planning;Functional electrical stimulation;Pain management;Self Care/advanced ADL retraining;Therapeutic Activities;UE/LE Coordination activities;Visual/perceptual  remediation/compensation;Therapeutic Exercise;Skin care/wound managment;Patient/family education;Functional mobility training;Disease mangement/prevention;Cognitive remediation/compensation;Community reintegration;DME/adaptive equipment instruction;Neuromuscular re-education;Splinting/orthotics;Psychosocial support;UE/LE Strength taining/ROM;Wheelchair propulsion/positioning OT Self Feeding Anticipated Outcome(s): No goal OT Basic Self-Care Anticipated Outcome(s): Supervision-Min A OT Toileting Anticipated Outcome(s): Supervision OT Bathroom Transfers Anticipated Outcome(s): Supervision  OT Recommendation Recommendations for Other Services: Neuropsych consult Patient destination: Home Follow Up Recommendations: Home health OT Equipment Recommended: To be determined   Skilled Therapeutic Intervention Pt greeted supine in bed, appearing very lethargic and reported pain medicine was making him groggy. He consented to bathing EOB with encouragement. Mod A supine<sit for LE mgt. Pt required mod vcs to maintain upright alignment instead of leaning towards Lt. He reported feeling significant weakness + dizziness. He also reported seeing 2 people across the room who were not there. RN notified to check BP and assess. BP 85/66, which per RN report, is a little lower than normal. Min A sit<stand with RW for OT to complete perihygiene. Pt required bilateral UE support on device due to persistent dizziness/weakness. Pt refused to don clothing, opting to put hospital gown on and lay back down. He was repositioned for comfort in bed and left with all needs and RN present.     OT Evaluation Precautions/Restrictions  Precautions Precautions: Sternal;Fall Precaution Comments: LVAD, sacral wound Restrictions Weight Bearing Restrictions: No General Chart Reviewed: Yes PT Missed Treatment Reason: Patient fatigue Family/Caregiver Present: No Vital Signs Therapy Vitals BP: (!) 85/66 Pain Pain Assessment Pain  Scale: Faces Pain Score: 0-No pain Faces Pain Scale: Hurts little more Pain Type: Acute pain Pain Location: Buttocks Pain Orientation: Mid;Lower Pain Onset: With Activity Pain  Intervention(s): Repositioned Home Living/Prior Functioning Home Living Family/patient expects to be discharged to:: Private residence Living Arrangements: Spouse/significant other, Children Available Help at Discharge: Family, Available 24 hours/day Type of Home: House Home Access: Stairs to enter Technical brewer of Steps: 3 Home Layout: One level Biochemist, clinical: Standard  Lives With: Spouse IADL History Homemaking Responsibilities: No(Spouse is responsible for IADLs) Occupation: Retired Type of Occupation: Horticulturist, commercial Leisure and Hobbies: Cooking Prior Function Level of Independence: Independent with basic ADLs, Independent with transfers, Independent with homemaking with ambulation, Independent with gait Driving: Yes ADL ADL ADL Comments: Please see functional navigator for ADL status Vision Baseline Vision/History: Wears glasses Wears Glasses: Reading only Patient Visual Report: No change from baseline Perception  Perception: Within Functional Limits Praxis Praxis: Intact Cognition Overall Cognitive Status: Impaired/Different from baseline Arousal/Alertness: Awake/alert Orientation Level: Person;Place;Situation Person: Oriented Place: Oriented Situation: Oriented Year: 2019 Month: July Day of Week: Correct Memory: Impaired Memory Impairment: Decreased recall of new information Immediate Memory Recall: Sock;Blue;Bed Memory Recall: Sock;Blue;Bed Memory Recall Sock: With Cue Memory Recall Blue: Without Cue Memory Recall Bed: With Cue Attention: Sustained Sustained Attention: Appears intact Awareness: Appears intact Problem Solving: Impaired Sensation Sensation Light Touch: Appears Intact Proprioception: Appears Intact Coordination Gross Motor Movements are Fluid and  Coordinated: Yes Fine Motor Movements are Fluid and Coordinated: Yes Motor  Motor Motor: Within Functional Limits Motor - Skilled Clinical Observations: generalized weakness  Trunk/Postural Assessment  Cervical Assessment Cervical Assessment: Within Functional Limits Thoracic Assessment Thoracic Assessment: Exceptions to WFL(rounded shoulders) Lumbar Assessment Lumbar Assessment: Exceptions to WFL(posterior pelvic tilt) Postural Control Postural Control: Within Functional Limits  Balance Balance Balance Assessed: Yes Static Sitting Balance Static Sitting - Balance Support: No upper extremity supported;Feet supported Static Sitting - Level of Assistance: 5: Stand by assistance Dynamic Sitting Balance Dynamic Sitting - Balance Support: No upper extremity supported;Feet supported(Bathing EOB) Dynamic Sitting - Level of Assistance: 5: Stand by assistance Dynamic Standing Balance Dynamic Standing - Level of Assistance: 4: Min assist(Assisting with perihygiene while standing with RW) Extremity/Trunk Assessment RUE Assessment RUE Assessment: Within Functional Limits Active Range of Motion (AROM) Comments: No functional deficits observed LUE Assessment LUE Assessment: Within Functional Limits Active Range of Motion (AROM) Comments: No functional deficits observed   See Function Navigator for Current Functional Status.   Refer to Care Plan for Long Term Goals  Recommendations for other services: Neuropsych   Discharge Criteria: Patient will be discharged from OT if patient refuses treatment 3 consecutive times without medical reason, if treatment goals not met, if there is a change in medical status, if patient makes no progress towards goals or if patient is discharged from hospital.  The above assessment, treatment plan, treatment alternatives and goals were discussed and mutually agreed upon: by patient  Skeet Simmer 09/11/2017, 4:24 PM

## 2017-09-11 NOTE — Progress Notes (Signed)
Initial Nutrition Assessment  DOCUMENTATION CODES:   Obesity unspecified  INTERVENTION:  Decrease Premier Protein (thickened to honey thick consistency) to BID, each supplement provides 160 calories and 30 grams of protein  MVI with Minerals  NUTRITION DIAGNOSIS:   Increased nutrient needs related to wound healing as evidenced by estimated needs.  GOAL:   Patient will meet greater than or equal to 90% of their needs  MONITOR:   PO intake, Supplement acceptance, Weight trends, Labs  REASON FOR ASSESSMENT:   Malnutrition Screening Tool    ASSESSMENT:   Patient with PMH ischemic cardiomyopathy s/p ICD, HTN, T2DM with retinopathy, CKD, CHF. He was recently admitted on 6/12 due to CHF, eventually had an LVAD placed with hematoma evacuation. Suffered cardiogenic shock requiring intubation. He is now admitted to rehab.  Spoke with patient at bedside.He was admitted to the hospital from 6/12-7/11. He states his appetite comes and goes but he makes himself eat and understands he needs to continue PO intake to help his wounds heal. Per chart review his meal completion was 25-100% during the last few days of his admission to the hospital. Had sausage, eggs, and grits for breakfast. PO during this admission to CIR was 75-80%. Lunch tray was brought for patient during this RD's visit but he declined having it setup for him. He reported a dry weight of 219 pounds on 6/15, continues to maintain this weight at this time.   Intake/Output Summary (Last 24 hours) at 09/11/2017 1653 Last data filed at 09/11/2017 1631 Gross per 24 hour  Intake 640 ml  Output 2775 ml  Net -2135 ml    Labs reviewed:  CBGs 98, 183, 91  Medications reviewed and include:  Insulin, 40K+, Vitamin C, Mag-Ox   NUTRITION - FOCUSED PHYSICAL EXAM:    Most Recent Value  Orbital Region  No depletion  Upper Arm Region  No depletion  Thoracic and Lumbar Region  No depletion  Buccal Region  No depletion  Temple  Region  No depletion  Clavicle Bone Region  No depletion  Clavicle and Acromion Bone Region  No depletion  Scapular Bone Region  No depletion  Dorsal Hand  No depletion  Patellar Region  No depletion  Anterior Thigh Region  No depletion  Posterior Calf Region  No depletion  Edema (RD Assessment)  Mild  Hair  Reviewed  Eyes  Reviewed  Mouth  Reviewed  Skin  Reviewed  Nails  Reviewed       Diet Order:   Diet Order           DIET DYS 2 Room service appropriate? Yes; Fluid consistency: Honey Thick  Diet effective now          EDUCATION NEEDS:   No education needs have been identified at this time  Skin:  Skin Assessment: Skin Integrity Issues: Skin Integrity Issues:: Unstageable Unstageable: rt buttocks Incisions: closed to chest, abdomen  Last BM:     Height:   Ht Readings from Last 1 Encounters:  09/10/17 5\' 11"  (1.803 m)    Weight:   Wt Readings from Last 1 Encounters:  09/11/17 219 lb (99.3 kg)    Ideal Body Weight:  78.2 kg  BMI:  Body mass index is 30.54 kg/m.  Estimated Nutritional Needs:   Kcal:  2200-2280 calories  Protein:  115-130 grams  Fluid:  >2L    [name] Anis. [place] Severs, MS, RD LDN Inpatient Clinical Dietitian Pager [phone]

## 2017-09-11 NOTE — Evaluation (Signed)
Speech Language Pathology Assessment and Plan  Patient Details  Name: [name] [name] MRN: [id number] Date of Birth: [birth year]  SLP Diagnosis: Voice disorder;Cognitive Impairments;Dysphagia  Rehab Potential: Good ELOS: 14-21 days     Today's Date: 09/11/2017 SLP Individual Time: 1100-1200 SLP Individual Time Calculation (min): 60 min   Problem List:  Patient Active Problem List   Diagnosis Date Noted  . Debility 09/10/2017  . CHF (congestive heart failure) ([place])   . Type 2 diabetes mellitus with retinopathy, with long-term current use of insulin ([place])   . Stage 3 chronic kidney disease ([place])   . Cardiogenic shock ([place])   . Acute respiratory failure ([place])   . Tachypnea   . Leukocytosis   . Acute blood loss anemia   . Pressure injury of skin 08/27/2017  . Acute respiratory failure with hypoxemia ([place])   . Acute pulmonary edema (H[facility])   . Metabolic alkalosis   . Presence of left ventricular assist device (LVAD) ([place])   . S/P TVR (tricuspid valve repair) 08/17/2017  . Goals of care, counseling/discussion   . Palliative care encounter   . Acute on chronic systolic (congestive) heart failure ([place]) 08/11/2017  . Acute on chronic combined systolic and diastolic CHF (congestive heart failure) ([place]) 11/19/2016  . Acute on chronic left and right systolic w/ diastolic heart failure, NYHA class 2 ([place]) 11/18/2016  . Hx of atrial flutter 11/18/2016  . CAD (coronary artery disease)-RCA-T, 70% PL (off CFX), 99% Prox LAD/90% Dist LAD, S/P TAXUS stent x 2 11/18/2016  . HTN (hypertension) 11/18/2016  . Diabetes mellitus with complication ([place]) 62/95/2841  . Thrombocytopenia ([place]) 11/18/2016  . Bilateral lower extremity edema 11/12/2016  . Diabetic polyneuropathy associated with diabetes mellitus due to underlying condition ([place]) 05/29/2015  . Chronic systolic heart failure ([place]) 02/05/2014  . Medtronic biventricular ICD, serial number  [id number]  02/05/2014  . CAD (coronary artery  disease), native coronary artery 01/08/2014  . Chronic renal disease, stage III ([place]) 01/08/2014  . Heart failure ([place]) 12/16/2013  . Mitral regurgitation 04/17/2013  . Chronic systolic dysfunction of left ventricle 06/29/2012  . Atrial flutter ([place]) 06/03/2012  . Acute kidney injury (nontraumatic) ([place]) 05/31/2012  . Chronic anticoagulation   . Acute on chronic systolic heart failure ([place]) 05/21/2012  . Nausea with vomiting 05/18/2012  . PAF (paroxysmal atrial fibrillation) ([place]) 05/17/2012  . Orthopnea 05/17/2012  . TIA (transient ischemic attack) 05/29/2010  . CHEST PAIN 11/13/2008  . VENTRICULAR TACHYCARDIA 08/14/2008  . Automatic implantable cardioverter-defibrillator in situ 08/14/2008  . Type 2 diabetes with nephropathy ([place]) 08/11/2008  . DYSLIPIDEMIA 08/11/2008  . Cardiomyopathy, ischemic 08/11/2008  . Disorder resulting from impaired renal function 08/11/2008  . HYPERCHOLESTEROLEMIA 04/29/2006  . HYPERTENSION, BENIGN SYSTEMIC 04/29/2006  . MYOCARDIAL INFARCTION, OLD 04/29/2006  . Coronary atherosclerosis 04/29/2006  . NEPHROLITHIASIS 04/29/2006   Past Medical History:  Past Medical History:  Diagnosis Date  . AICD (automatic cardioverter/defibrillator) present 02/05/2014   Upgrade to Medtronic biventricular ICD, serial number  [id number]   . Atrial flutter ([place]) 04/2012   s/p TEE-EPS+RFCA 04/2012  . CAD (coronary artery disease) 3244,0102 X 2    RCA-T, 70% PL (off CFX), 99% Prox LAD/90% Dist LAD, S/P TAXUS stent x 2  . CHF (congestive heart failure) ([place])   . Chronic anticoagulation   . Chronic systolic heart failure ([place])   . CKD (chronic kidney disease)   . Diabetic retinopathy ([place])   . DM type 2 (diabetes mellitus, type 2) ([place])  insulin dependent  . HTN (hypertension)   . Hypercholesteremia    ablation  . ICD (implantable cardiac defibrillator) in place   . Ischemic cardiomyopathy March 2015   20-25% 2D   . Nephrolithiasis   . Ventricular tachycardia  [facility])    Past Surgical History:  Past Surgical History:  Procedure Laterality Date  . ATRIAL FLUTTER ABLATION N/A 05/19/2012   Procedure: ATRIAL FLUTTER ABLATION;  Surgeon: Thompson Grayer, MD;  Location: [facility] CATH LAB;  Service: Cardiovascular;  Laterality: N/A;  . BI-VENTRICULAR IMPLANTABLE CARDIOVERTER DEFIBRILLATOR UPGRADE N/A 02/05/2014   Procedure: BI-VENTRICULAR IMPLANTABLE CARDIOVERTER DEFIBRILLATOR UPGRADE;  Surgeon: Evans Lance, MD;  Location: [facility] CATH LAB;  Service: Cardiovascular;  Laterality: N/A;  . BIV ICD GENERTAOR CHANGE OUT  02/05/2014   Upgrade to Medtronic biventricular ICD, serial number  [id number] by Dr. [name]  . CARDIAC DEFIBRILLATOR PLACEMENT  2007    Medtronic Maximo VR, serial number [id number]  . INSERTION OF IMPLANTABLE LEFT VENTRICULAR ASSIST DEVICE N/A 08/16/2017   Procedure: INSERTION OF IMPLANTABLE LEFT VENTRICULAR ASSIST DEVICE-HM3;  Surgeon: Ivin Poot, MD;  Location: [facility];  Service: Open Heart Surgery;  Laterality: N/A;  . IR FLUORO GUIDE CV LINE RIGHT  08/12/2017  . IR US GUIDE VASC ACCESS RIGHT  08/12/2017  . MEDIASTINAL EXPLORATION  08/17/2017   Procedure: MEDIASTINAL REXPLORATION with evacuation of hematoma;  Surgeon: Prescott Gum, Collier Salina, MD;  Location: [facility] OR;  Service: Open Heart Surgery;;  . PERCUTANEOUS CORONARY STENT INTERVENTION (PCI-S)  January 2002   PTCA/Stent Distal RCA  . PERCUTANEOUS CORONARY STENT INTERVENTION (PCI-S)  June 2002   PTCA/Stent x 3 RCA, thrombolysis - failed  . PERCUTANEOUS CORONARY STENT INTERVENTION (PCI-S)  July 2006   TAXUS stents to prox and distal LAD  . RIGHT HEART CATH N/A 07/22/2017   Procedure: RIGHT HEART CATH;  Surgeon: Jolaine Artist, MD;  Location: [place] CV LAB;  Service: Cardiovascular;  Laterality: N/A;  . RIGHT HEART CATH N/A 08/13/2017   Procedure: RIGHT HEART CATH - swan;  Surgeon: Jolaine Artist, MD;  Location: [place] CV LAB;  Service: Cardiovascular;  Laterality: N/A;  . RIGHT/LEFT  HEART CATH AND CORONARY ANGIOGRAPHY N/A 02/18/2017   Procedure: RIGHT/LEFT HEART CATH AND CORONARY ANGIOGRAPHY;  Surgeon: Jolaine Artist, MD;  Location: [place] CV LAB;  Service: Cardiovascular;  Laterality: N/A;  . STERNAL CLOSURE N/A 08/19/2017   Procedure: STERNAL CLOSURE;  Surgeon: Ivin Poot, MD;  Location: [place];  Service: Thoracic;  Laterality: N/A;  . TEE WITHOUT CARDIOVERSION N/A 08/16/2017   Procedure: TRANSESOPHAGEAL ECHOCARDIOGRAM (TEE);  Surgeon: Prescott Gum, Collier Salina, MD;  Location: [place];  Service: Open Heart Surgery;  Laterality: N/A;  . TEE WITHOUT CARDIOVERSION N/A 08/19/2017   Procedure: TRANSESOPHAGEAL ECHOCARDIOGRAM (TEE);  Surgeon: Prescott Gum, Collier Salina, MD;  Location: [place];  Service: Thoracic;  Laterality: N/A;  . TRICUSPID VALVE REPLACEMENT N/A 08/16/2017   Procedure: TRICUSPID VALVE REPAIR using Oletta Lamas MC3 Ring size 30;  Surgeon: Ivin Poot, MD;  Location: [place];  Service: Open Heart Surgery;  Laterality: N/A;    Assessment / Plan / Recommendation Clinical Impression   MORIS RATCHFORD is a 67 year old male with past medical history of ventricular tachycardia, ischemic cardiomyopathy status post ICD, hypertension, type 2 diabetes mellitus with retinopathy, CKD, chronic systolic heart failure, CAD, a flutter who presented on 08/11/2017 due to CHF. He was admitted with plans for home milrinone initiation however noted to be fluid overloaded and ultimately had  LVAD placed on 6/17 with hematoma evacuation on 8/18 and delayed closure 6/20. Hospital course significant for cardiogenic shock requiring pressors, VDRF--tolerated extubation by 6/28 V. tach/V. Fib treated with urgent cardioversion X 2, acute blood loss anemia, encephalopathy, leukocytosis and volume overload. Fluid overload improved with IV diuresis, milrinone weaned off and he has been transitioned to orals.  Fevers have resolved on meropenum and BC X 2 negative. ICD turned back on today. On dysphagia 2, honey  liquids with chin tuck due to dysphagia. Has had complaints of buttock pain and deep tissue injury bilateral buttocks showed minimal progress with santyl therefore hydrotherapy started today. Mentation and activity tolerance are improving but he continues to have decline in functional status. CIR recommended due to debility.  SLP evaluation was completed on 09/11/2017 with the following results:  Pt presents with no overt s/s of aspiration with trials of ice chips and thin liquids during bedside swallow evaluation; however, pt is known to be a silent aspirator post prolonged intubation due to sensory deficits.  As a result, recommend that pt remain on his currently prescribed diet with trials of ice chips and/or thin liquids with SLP to continue working towards repeat objective assessment.  Recommend FEES to assess glottal closure due to vocal quality.   Pt demonstrates functional communication impairments characterized by aphonia which I suspect to be related to prolonged intubation.  This impacts his intelligibility at the sentence level and pt needs mod assist for speech intelligibility.  Pt was able to return demonstration of EMST trainer for 25 repetitions with mod I and a self reported effort level of 7/10.   Cognition was not formally assessed; however, pt and wife both report ongoing deficits in memory.  Pt could recall his swallowing precautions after having been taught them over several sessions by acute ST; however, his wife needed to provide more assistance for recall of more novel daily information.  Pt also needed mod cues to recall sternal precautions in the moment during bed mobility although he could verbalize them.   Given the abovementioned deficits, pt would benefit from skilled ST while inpatient in order to maximize functional independence and reduce burden of care prior to discharge.  Anticipate that pt will need 24/7 supervision at discharge in addition to [place] follow up at next level of  care.      Skilled Therapeutic Interventions          Cognitive-linguistic evaluation completed with results and recommendations reviewed with family.     SLP Assessment  Patient will need skilled Speech Lanaguage Pathology Services during CIR admission    Recommendations  SLP Diet Recommendations: Dysphagia 2 (Fine chop);Honey Liquid Administration via: Cup Medication Administration: Crushed with puree Supervision: Patient able to self feed;Full supervision/cueing for compensatory strategies Compensations: Slow rate;Small sips/bites;Clear throat intermittently;Chin tuck Postural Changes and/or Swallow Maneuvers: Seated upright 90 degrees Oral Care Recommendations: Oral care BID Recommendations for Other Services: Neuropsych consult Patient destination: Home Follow up Recommendations: 24 hour supervision/assistance;Home Health SLP;Outpatient SLP Equipment Recommended: To be determined    SLP Frequency 3 to 5 out of 7 days   SLP Duration  SLP Intensity  SLP Treatment/Interventions 14-21 days   Minumum of 1-2 x/day, 30 to 90 minutes  Cognitive remediation/compensation;Cueing hierarchy;Environmental controls;Functional tasks;Dysphagia/aspiration precaution training;Patient/family education;Internal/external aids;Speech/Language facilitation    Pain Pain Assessment Pain Scale: 0-10 Pain Score: 0-No pain  Prior Functioning Cognitive/Linguistic Baseline: Within functional limits Type of Home: House  Lives With: Spouse Available Help at Discharge: Family;Available 24 hours/day  Vocation: Retired  Function:  Eating Eating   Modified Consistency Diet: Yes Eating Assist Level: Supervision or verbal cues           Cognition Comprehension Comprehension assist level: Follows complex conversation/direction with extra time/assistive device  Expression   Expression assist level: Expresses basic 50 - 74% of the time/requires cueing 25 - 49% of the time. Needs to repeat parts  of sentences.  Social Interaction Social Interaction assist level: Interacts appropriately 90% of the time - Needs monitoring or encouragement for participation or interaction.  Problem Solving Problem solving assist level: Solves basic 50 - 74% of the time/requires cueing 25 - 49% of the time  Memory Memory assist level: Recognizes or recalls 50 - 74% of the time/requires cueing 25 - 49% of the time   Short Term Goals: Week 1: SLP Short Term Goal 1 (Week 1): Pt will consume dys 2 textures and honey thick liquids with supervision cues for use of swallowing precautions and minimal overt s/s of aspiration.  SLP Short Term Goal 2 (Week 1): Pt will consume therapeutic trials of ice chips with supervision cues for use of swallowing precautions and minimal overt s/s of aspiration.  SLP Short Term Goal 3 (Week 1): Pt will recall new information with min cues for use of external aids.   SLP Short Term Goal 4 (Week 1): Pt will complete basic familiar tasks with min cues for functional problem solving.   SLP Short Term Goal 5 (Week 1): Pt will return demonstration of EMST trainer with mod I and for 25 repetitions a self reported effort level of <7/10.  SLP Short Term Goal 6 (Week 1): Pt will increase vocal intensity to achieve intelligibility at the sentence level with min assist.   Refer to Care Plan for Long Term Goals  Recommendations for other services: Neuropsych  Discharge Criteria: Patient will be discharged from SLP if patient refuses treatment 3 consecutive times without medical reason, if treatment goals not met, if there is a change in medical status, if patient makes no progress towards goals or if patient is discharged from hospital.  The above assessment, treatment plan, treatment alternatives and goals were discussed and mutually agreed upon: by patient and by family  Emilio Math 09/11/2017, 3:27 PM

## 2017-09-11 NOTE — Progress Notes (Signed)
[facility] PHYSICAL MEDICINE & REHABILITATION     PROGRESS NOTE    Subjective/Complaints: Was restless last night. Is restless at home and usually doesn't sleep that well. Denies pain except on sacrum  ROS: Patient denies fever, rash, sore throat, blurred vision, nausea, vomiting, diarrhea, cough, shortness of breath or chest pain, joint or back pain, headache, or mood change.   Objective:  No results found. Recent Labs    09/10/17 0500 09/11/17 0306  WBC 6.0 5.7  HGB 9.2* 9.2*  HCT 30.1* 31.0*  PLT 274 294   Recent Labs    09/10/17 0500 09/11/17 0306  NA 137 136  K 3.9 3.9  CL 101 100  GLUCOSE 125* 124*  BUN 19 20  CREATININE 1.19 1.28*  CALCIUM 8.8* 8.7*   CBG (last 3)  Recent Labs    09/10/17 1651 09/10/17 2125 09/11/17 0636  GLUCAP 98 183* 91    Wt Readings from Last 3 Encounters:  09/11/17 99.3 kg (219 lb)  09/10/17 97.8 kg (215 lb 8 oz)  07/22/17 103.9 kg (229 lb)     Intake/Output Summary (Last 24 hours) at 09/11/2017 0815 Last data filed at 09/11/2017 0315 Gross per 24 hour  Intake 520 ml  Output 2325 ml  Net -1805 ml    Vital Signs: Blood pressure 92/74, pulse 66, temperature 98.8 F (37.1 C), temperature source Oral, resp. rate 18, height 5\' 11"  (1.803 m), weight 99.3 kg (219 lb), SpO2 94 %. Physical Exam:  Constitutional: No distress . Vital signs reviewed. HEENT: EOMI, oral membranes moist. Speaks with whisper Neck: supple Cardiovascular: Hum still    Respiratory: CTA Bilaterally without wheezes or rales. Normal effort    GI: BS +, non-tender, non-distended  Musculoskeletal:  PICC line in place RUR. 1+l edema BLE.  Neurological: He is oriented to person, place, and time. No cranial nerve deficit.  Pt slow to arouse. UE 4- to 4/5 prox to distal. LE: 2+/5 HF, 3/5 KE and 4/5 ADF/PF. No sensory deficits. Fair insight and awareness. Slow to process  Skin: Skin is warm. Sacral wound superficial but with layer of fibronecrotic tissue on  exterior---wound approximrately 2" in diameter extending into gluteal cleft.    Dry dressing on abdomen.  Psychiatric: He has a normal mood and affect. His behavior is normal.     Assessment/Plan: 1. Functional deficits secondary to debility after CHF/LVAD/VDRF which require 3+ hours per day of interdisciplinary therapy in a comprehensive inpatient rehab setting. Physiatrist is providing close team supervision and 24 hour management of active medical problems listed below. Physiatrist and rehab team continue to assess barriers to discharge/monitor patient progress toward functional and medical goals.  Function:  Bathing Bathing position      Bathing parts      Bathing assist        Upper Body Dressing/Undressing Upper body dressing                    Upper body assist        Lower Body Dressing/Undressing Lower body dressing                                  Lower body assist        Toileting Toileting Toileting activity did not occur: N/A        Toileting assist     Transfers Chair/bed transfer  Locomotion Ambulation           Wheelchair          Cognition Comprehension Comprehension assist level: Follows complex conversation/direction with extra time/assistive device  Expression Expression assist level: Expresses basic 75 - 89% of the time/requires cueing 10 - 24% of the time. Needs helper to occlude trach/needs to repeat words.  Social Interaction    Problem Solving Problem solving assist level: Solves basic 75 - 89% of the time/requires cueing 10 - 24% of the time  Memory Memory assist level: Recognizes or recalls 75 - 89% of the time/requires cueing 10 - 24% of the time   Medical Problem List and Plan:  1. Functional and mobility deficits secondary to debility after congestive heart failure/LVAD/VDRF and multiple other medical issues  -beginning therapies  2. DVT Prophylaxis/Anticoagulation: Pharmaceutical:  Coumadin INR therapeutic 3. Pain Management: tylenol prn.   -pt very sedated from oxycodone yesterday. Will try tramadol in place.  4. Mood: LCSW to follow for evaluation and support.  5. Neuropsych: This patient is capable of making decisions on his own behalf.  6. Deep tissue injury bilateral buttocks/Skin/Wound Care: Air mattress overlay. Boost when in chair. Hydrotherapy to buttocks M->Sa.   -try tramadol in place of oxycodone prior to hydrotherapy 7. Fluids/Electrolytes/Nutrition: Strict I/O. Continue nutritional supplement to promote wound healing due due to low albumin levels. Add multivitamin.  8. T2DM: Monitor BS ac/hs. Continue Lantus  9. A fib: Off amiodarone. Monitor HR bid  10. 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 Started on [facility] 7/12. Continue Spironolactone and digoxin daily. Monitor renal status serially.  11. [name]/VF: S/p emergent cardioversion 6/24 and 6/26-->ICD turned on 7/12. To keep K> 4.0 and Mg> 2.0  12. ABLA: Improved with transfusion. Continue to monitor.  13. Dysphagia: Continue dysphagia 2, honey liquids with chin tuck. Hypernatremia has resolved.  14. LVAD implant 6/17: On coumadin and low dose ASA. Education ongoing with patient and wife. Dressing changes per VAD coordinator or trained caregiver.   LOS (Days) [place] EVALUATION WAS PERFORMED  Meredith Staggers, MD 09/11/2017 8:15 AM

## 2017-09-11 NOTE — Evaluation (Signed)
Physical Therapy Assessment and Plan  Patient Details  Name: [name] [name] MRN: [id number] Date of Birth: [birth year]  PT Diagnosis: Cognitive deficits, Difficulty walking, Dizziness and giddiness, Edema, Impaired cognition, Muscle weakness and Pain in sacrum Rehab Potential: Good ELOS: 2 weeks   Today's Date: 09/11/2017 PT Individual Time: 1445-1530 PT Individual Time Calculation (min): 45 min  and Today's Date: 09/11/2017 PT Missed Time: 15 Minutes Missed Time Reason: Patient fatigue   Problem List:  Patient Active Problem List   Diagnosis Date Noted  . Debility 09/10/2017  . CHF (congestive heart failure) ([place])   . Type 2 diabetes mellitus with retinopathy, with long-term current use of insulin ([place])   . Stage 3 chronic kidney disease ([place])   . Cardiogenic shock ([place])   . Acute respiratory failure ([place])   . Tachypnea   . Leukocytosis   . Acute blood loss anemia   . Pressure injury of skin 08/27/2017  . Acute respiratory failure with hypoxemia ([place])   . Acute pulmonary edema (HCC)   . Metabolic alkalosis   . Presence of left ventricular assist device (LVAD) ([place])   . S/P TVR (tricuspid valve repair) 08/17/2017  . Goals of care, counseling/discussion   . Palliative care encounter   . Acute on chronic systolic (congestive) heart failure ([place]) 08/11/2017  . Acute on chronic combined systolic and diastolic CHF (congestive heart failure) ([place]) 11/19/2016  . Acute on chronic left and right systolic w/ diastolic heart failure, NYHA class 2 ([place]) 11/18/2016  . Hx of atrial flutter 11/18/2016  . CAD (coronary artery disease)-RCA-T, 70% PL (off CFX), 99% Prox LAD/90% Dist LAD, S/P TAXUS stent x 2 11/18/2016  . HTN (hypertension) 11/18/2016  . Diabetes mellitus with complication ([place]) 08/30/1599  . Thrombocytopenia ([place]) 11/18/2016  . Bilateral lower extremity edema 11/12/2016  . Diabetic polyneuropathy associated with diabetes mellitus due to underlying condition ([place]) 05/29/2015   . Chronic systolic heart failure ([place]) 02/05/2014  . Medtronic biventricular ICD, serial number  [id number]  02/05/2014  . CAD (coronary artery disease), native coronary artery 01/08/2014  . Chronic renal disease, stage III ([place]) 01/08/2014  . Heart failure ([place]) 12/16/2013  . Mitral regurgitation 04/17/2013  . Chronic systolic dysfunction of left ventricle 06/29/2012  . Atrial flutter ([place]) 06/03/2012  . Acute kidney injury (nontraumatic) ([place]) 05/31/2012  . Chronic anticoagulation   . Acute on chronic systolic heart failure ([place]) 05/21/2012  . Nausea with vomiting 05/18/2012  . PAF (paroxysmal atrial fibrillation) ([place]) 05/17/2012  . Orthopnea 05/17/2012  . TIA (transient ischemic attack) 05/29/2010  . CHEST PAIN 11/13/2008  . VENTRICULAR TACHYCARDIA 08/14/2008  . Automatic implantable cardioverter-defibrillator in situ 08/14/2008  . Type 2 diabetes with nephropathy ([place]) 08/11/2008  . DYSLIPIDEMIA 08/11/2008  . Cardiomyopathy, ischemic 08/11/2008  . Disorder resulting from impaired renal function 08/11/2008  . HYPERCHOLESTEROLEMIA 04/29/2006  . HYPERTENSION, BENIGN SYSTEMIC 04/29/2006  . MYOCARDIAL INFARCTION, OLD 04/29/2006  . Coronary atherosclerosis 04/29/2006  . NEPHROLITHIASIS 04/29/2006    Past Medical History:  Past Medical History:  Diagnosis Date  . AICD (automatic cardioverter/defibrillator) present 02/05/2014   Upgrade to Medtronic biventricular ICD, serial number  [id number]   . Atrial flutter ([place]) 04/2012   s/p TEE-EPS+RFCA 04/2012  . CAD (coronary artery disease) 0932,3557 X 2    RCA-T, 70% PL (off CFX), 99% Prox LAD/90% Dist LAD, S/P TAXUS stent x 2  . CHF (congestive heart failure) ([place])   . Chronic anticoagulation   . Chronic systolic heart failure ([place])   .  CKD (chronic kidney disease)   . Diabetic retinopathy ([place])   . DM type 2 (diabetes mellitus, type 2) (HCC)    insulin dependent  . HTN (hypertension)   . Hypercholesteremia    ablation  .  ICD (implantable cardiac defibrillator) in place   . Ischemic cardiomyopathy March 2015   20-25% 2D   . Nephrolithiasis   . Ventricular tachycardia [facility])    Past Surgical History:  Past Surgical History:  Procedure Laterality Date  . ATRIAL FLUTTER ABLATION N/A 05/19/2012   Procedure: ATRIAL FLUTTER ABLATION;  Surgeon: Thompson Grayer, MD;  Location: [facility] CATH LAB;  Service: Cardiovascular;  Laterality: N/A;  . BI-VENTRICULAR IMPLANTABLE CARDIOVERTER DEFIBRILLATOR UPGRADE N/A 02/05/2014   Procedure: BI-VENTRICULAR IMPLANTABLE CARDIOVERTER DEFIBRILLATOR UPGRADE;  Surgeon: Evans Lance, MD;  Location: [facility] CATH LAB;  Service: Cardiovascular;  Laterality: N/A;  . BIV ICD GENERTAOR CHANGE OUT  02/05/2014   Upgrade to Medtronic biventricular ICD, serial number  [id number] by Dr. [name]  . CARDIAC DEFIBRILLATOR PLACEMENT  2007    Medtronic Maximo VR, serial number [id number]  . INSERTION OF IMPLANTABLE LEFT VENTRICULAR ASSIST DEVICE N/A 08/16/2017   Procedure: INSERTION OF IMPLANTABLE LEFT VENTRICULAR ASSIST DEVICE-HM3;  Surgeon: Ivin Poot, MD;  Location: [place];  Service: Open Heart Surgery;  Laterality: N/A;  . IR FLUORO GUIDE CV LINE RIGHT  08/12/2017  . IR US GUIDE VASC ACCESS RIGHT  08/12/2017  . MEDIASTINAL EXPLORATION  08/17/2017   Procedure: MEDIASTINAL REXPLORATION with evacuation of hematoma;  Surgeon: Prescott Gum, Collier Salina, MD;  Location: [facility] OR;  Service: Open Heart Surgery;;  . PERCUTANEOUS CORONARY STENT INTERVENTION (PCI-S)  January 2002   PTCA/Stent Distal RCA  . PERCUTANEOUS CORONARY STENT INTERVENTION (PCI-S)  June 2002   PTCA/Stent x 3 RCA, thrombolysis - failed  . PERCUTANEOUS CORONARY STENT INTERVENTION (PCI-S)  July 2006   TAXUS stents to prox and distal LAD  . RIGHT HEART CATH N/A 07/22/2017   Procedure: RIGHT HEART CATH;  Surgeon: Jolaine Artist, MD;  Location: [place] CV LAB;  Service: Cardiovascular;  Laterality: N/A;  . RIGHT HEART CATH N/A 08/13/2017   Procedure:  RIGHT HEART CATH - swan;  Surgeon: Jolaine Artist, MD;  Location: [place] CV LAB;  Service: Cardiovascular;  Laterality: N/A;  . RIGHT/LEFT HEART CATH AND CORONARY ANGIOGRAPHY N/A 02/18/2017   Procedure: RIGHT/LEFT HEART CATH AND CORONARY ANGIOGRAPHY;  Surgeon: Jolaine Artist, MD;  Location: [place] CV LAB;  Service: Cardiovascular;  Laterality: N/A;  . STERNAL CLOSURE N/A 08/19/2017   Procedure: STERNAL CLOSURE;  Surgeon: Ivin Poot, MD;  Location: [place];  Service: Thoracic;  Laterality: N/A;  . TEE WITHOUT CARDIOVERSION N/A 08/16/2017   Procedure: TRANSESOPHAGEAL ECHOCARDIOGRAM (TEE);  Surgeon: Prescott Gum, Collier Salina, MD;  Location: [place];  Service: Open Heart Surgery;  Laterality: N/A;  . TEE WITHOUT CARDIOVERSION N/A 08/19/2017   Procedure: TRANSESOPHAGEAL ECHOCARDIOGRAM (TEE);  Surgeon: Prescott Gum, Collier Salina, MD;  Location: [place];  Service: Thoracic;  Laterality: N/A;  . TRICUSPID VALVE REPLACEMENT N/A 08/16/2017   Procedure: TRICUSPID VALVE REPAIR using Oletta Lamas MC3 Ring size 30;  Surgeon: Ivin Poot, MD;  Location: [place];  Service: Open Heart Surgery;  Laterality: N/A;    Assessment & Plan Clinical Impression: Patient is a  67 year old male with past medical history of ventricular tachycardia, ischemic cardiomyopathy status post ICD, hypertension, type 2 diabetes mellitus with retinopathy, CKD, chronic systolic heart failure, CAD, a flutter who presented on 08/11/2017 due to  CHF. He was admitted with plans for home milrinone initiation however noted to be fluid overloaded and ultimately had LVAD placed on 6/17 with hematoma evacuation on 8/18 and delayed closure 6/20. Hospital course significant for cardiogenic shock requiring pressors, VDRF--tolerated extubation by 6/28 V. tach/V. Fib treated with urgent cardioversion X 2, acute blood loss anemia, encephalopathy, leukocytosis and volume overload. Fluid overload improved with IV diuresis, milrinone weaned off and he has been  transitioned to orals.  Fevers have resolved on meropenum and BC X 2 negative. ICD turned back on today. On dysphagia 2, honey liquids with chin tuck due to dysphagia. Has had complaints of buttock pain and deep tissue injury bilateral buttocks showed minimal progress with santyl therefore hydrotherapy started today. Mentation and activity tolerance are improving but he continues to have decline in functional status. CIR recommended due to debility. Patient transferred to CIR on 09/10/2017 .   Patient currently requires mod assist with mobility secondary to muscle weakness, decreased cardiorespiratoy endurance, decreased motor planning, decreased initiation, decreased attention, decreased problem solving, decreased safety awareness and decreased memory and decreased standing balance, decreased balance strategies and difficulty maintaining precautions.  Prior to hospitalization, patient was independent  with mobility and lived with Spouse in a House home.  Home access is 3Stairs to enter.  Patient will benefit from skilled PT intervention to maximize safe functional mobility, minimize fall risk and decrease caregiver burden for planned discharge home with 24 hour supervision.  Anticipate patient will benefit from follow up [place] at discharge.  PT - End of Session Activity Tolerance: Tolerates < 10 min activity with changes in vital signs Endurance Deficit: Yes Endurance Deficit Description: dizziness and fatigue w/ all functional movements PT Assessment Rehab Potential (ACUTE/IP ONLY): Good PT Barriers to Discharge: Medical stability;Wound Care PT Barriers to Discharge Comments: s/p LVAD, sacral wound PT Patient demonstrates impairments in the following area(s): Balance;Behavior;Edema;Endurance;Motor;Safety;Skin Integrity PT Transfers Functional Problem(s): Bed Mobility;Bed to Chair;Car;Furniture;Floor PT Locomotion Functional Problem(s): Stairs;Wheelchair Mobility;Ambulation PT Plan PT Intensity:  Minimum of 1-2 x/day ,45 to 90 minutes PT Frequency: 5 out of 7 days PT Duration Estimated Length of Stay: 2 weeks PT Treatment/Interventions: Visual/perceptual remediation/compensation;Stair training;Pain management;Disease management/prevention;Ambulation/gait training;Balance/vestibular training;DME/adaptive equipment instruction;Patient/family education;Therapeutic Activities;Wheelchair propulsion/positioning;Psychosocial support;Therapeutic Exercise;Cognitive remediation/compensation;Community reintegration;Functional mobility training;Skin care/wound management;UE/LE Strength taining/ROM;UE/LE Coordination activities;Splinting/orthotics;Discharge planning;Neuromuscular re-education PT Transfers Anticipated Outcome(s): Supervision PT Locomotion Anticipated Outcome(s): Supervision gait household distances PT Recommendation Follow Up Recommendations: Home health PT Patient destination: Home Equipment Recommended: To be determined  Skilled Therapeutic Intervention  Pt and wife instructed patient in PT Evaluation and initiated treatment intervention; see below for results. Pt limited in function this session 2/2 increased lethargy and dizziness. Per RN, suspect due to pain medications given this morning for hydrotherapy. Pt remains alert and oriented but falling asleep easily throughout session. Agreeable to move slowly through evaluation. Sat EOB w/ supervision for 10+ minutes, no decrease in baseline dizziness. Refused to attempt sit<>stand to RW, states he is afraid to because of the dizziness. Pt able to state sternal precautions w/o cues when asked, but required frequent verbal cues during bed mobility. Wife states pt has been independent w/ performing LVAD management. Will assess when pt is more awake. Pt and wife educated patient in [place], rehab potential, rehab goals, and discharge recommendations specifically in light of LVAD. Ended session in supine and in care of wife, all needs met.   PT  Evaluation Precautions/Restrictions Precautions Precautions: Sternal;Fall Precaution Comments: LVAD, sacral wound Restrictions Weight Bearing Restrictions: No General  Vital SignsTherapy Vitals BP: (!) 85/66 Pain Pain Assessment Pain Scale: Faces Pain Score: 0-No pain Faces Pain Scale: Hurts little more Pain Type: Acute pain Pain Location: Buttocks Pain Orientation: Mid;Lower Pain Onset: With Activity Pain Intervention(s): Repositioned Home Living/Prior Functioning Home Living Available Help at Discharge: Family;Available 24 hours/day Type of Home: House Home Access: Stairs to enter [employer] of Steps: 3 Home Layout: One level Bathroom Toilet: Standard  Lives With: Spouse Prior Function Level of Independence: Independent with basic ADLs;Independent with transfers;Independent with homemaking with ambulation;Independent with gait  Able to Take Stairs?: Yes Driving: Yes Vocation: Retired Biomedical scientist: retired, 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 Overall Cognitive Status: Impaired/Different from baseline Arousal/Alertness: Awake/alert Orientation Level: Oriented X4 Attention: Sustained Sustained Attention: Appears intact Memory: Impaired Memory Impairment: Decreased recall of new information Awareness: Appears intact Problem Solving: Impaired Problem Solving Impairment: Functional basic Safety/Judgment: Impaired Comments: decreased safety awareness and decreased recall, requires cues to maintain sternal precautions right after verbalizing them to therapist Sensation Sensation Light Touch: Appears Intact Coordination Gross Motor Movements are Fluid and Coordinated: Yes Fine Motor Movements are Fluid and Coordinated: Yes Motor  Motor Motor: Within Functional Limits Motor - Skilled Clinical Observations: generalized weakness   Mobility Bed Mobility Bed Mobility: Rolling Right;Rolling Left;Supine to Sit;Sit to Supine Rolling Right: Maximal Assistance - Patient 25-49% Rolling Left: Maximal Assistance - Patient 25-49% Supine to Sit: Moderate Assistance - Patient 50-74% Sit to Supine: Moderate Assistance - Patient 50-74% Locomotion  Gait Ambulation: No Gait Gait: No Stairs / Additional Locomotion Stairs: No Wheelchair Mobility Wheelchair Mobility: No  Trunk/Postural Assessment  Cervical Assessment Cervical Assessment: Within Functional Limits Thoracic Assessment Thoracic Assessment: Exceptions to WFL(rounded shoulders) Lumbar Assessment Lumbar Assessment: Exceptions to WFL(posterior pelvic tilt) Postural Control Postural Control: Within Functional Limits  Balance Balance Balance Assessed: Yes Static Sitting Balance Static Sitting - Balance Support: No upper extremity supported;Feet supported Static Sitting - Level of Assistance: 5: Stand by assistance Dynamic Sitting Balance Dynamic Sitting - Balance Support: No upper extremity supported;Feet supported Dynamic Sitting - Level of Assistance: 5: Stand by assistance(needs verbal cues, delayed, responds w/ increased time, suspect 2/2 lethargy) Extremity Assessment  RLE Assessment RLE Assessment: Exceptions to [facility] General Strength Comments: 3+ to 4-/5 globally LLE Assessment LLE Assessment: Exceptions to [facility] General Strength Comments: 3+ to 4-/5 globally   See Function Navigator for Current Functional Status.   Refer to Care Plan for Long Term Goals  Recommendations for other services: None   Discharge Criteria: Patient will be discharged from PT if patient refuses treatment 3 consecutive times without medical reason, if treatment goals not met, if there is a change in medical status, if patient makes no progress towards goals or if patient is discharged from hospital.  The above assessment, treatment plan, treatment alternatives and goals were  discussed and mutually agreed upon: by patient and by family  Chai Verdejo K Arnette 09/11/2017, 3:40 PM

## 2017-09-11 NOTE — Progress Notes (Signed)
Physical Therapy Wound Treatment Patient Details  Name: [name] [name] MRN: [id number] Date of Birth: 12/08/1950  Today's Date: 09/11/2017 PT Individual Time: 0828-0910 PT Individual Time Calculation (min): 42 min   Subjective  Subjective: Agreeable to therapy. Family with many questions. Patient and Family Stated Goals: Heal wound Prior Treatments: Wet to dry dressing changes with Santyl  Pain Score: Pt premedicated prior to treatment and slept through most of the the session. Pt roused once during debridement but quickly fell back to sleep.   Wound Assessment  Pressure Injury 08/27/17 Unstageable - Full thickness tissue loss in which the base of the ulcer is covered by slough (yellow, tan, gray, green or brown) and/or eschar (tan, brown or black) in the wound bed. 8.5 x 6 (90% darkened 5% yellow and 5% pink (Active)  Wound Image   09/10/2017 10:51 AM  Dressing Type ABD;Barrier Film (skin prep);Gauze (Comment);Moist to dry 09/11/2017  9:00 AM  Dressing Changed;Clean;Dry;Intact 09/11/2017  9:00 AM  Dressing Change Frequency Daily 09/11/2017  9:00 AM  State of Healing Eschar 09/11/2017  9:00 AM  Site / Wound Assessment Pink;Pale;Yellow;Brown 09/11/2017  9:00 AM  % Wound base Red or Granulating 20% 09/11/2017  9:00 AM  % Wound base Yellow/Fibrinous Exudate 60% 09/11/2017  9:00 AM  % Wound base Black/Eschar 20% 09/11/2017  9:00 AM  Peri-wound Assessment Intact 09/11/2017  9:00 AM  Wound Length (cm) 9.1 cm 09/10/2017 10:51 AM  Wound Width (cm) 7.5 cm 09/10/2017 10:51 AM  Wound Depth (cm) 0.2 cm 09/10/2017 10:51 AM  Wound Surface Area (cm^2) 68.25 cm^2 09/10/2017 10:51 AM  Wound Volume (cm^3) 13.65 cm^3 09/10/2017 10:51 AM  Tunneling (cm) 0 09/10/2017 10:51 AM  Undermining (cm) 0 09/10/2017 10:51 AM  Margins Unattached edges (unapproximated) 09/11/2017  9:00 AM  Drainage Amount Moderate 09/11/2017  9:00 AM  Drainage Description Purulent 09/11/2017  9:00 AM  Treatment Cleansed;Debridement  (Selective);Hydrotherapy (Pulse lavage);Other (Comment);Packing (Saline gauze) 09/11/2017  9:00 AM   Santyl applied to necrotic tissure   Hydrotherapy Pulsed lavage therapy - wound location: R buttock Pulsed Lavage with Suction (psi): 8 psi Pulsed Lavage with Suction - Normal Saline Used: 1000 mL Pulsed Lavage Tip: Tip with splash shield Selective Debridement Selective Debridement - Location: R buttock Selective Debridement - Tools Used: Forceps;Scalpel Selective Debridement - Tissue Removed: Owens Shark and yellow necrotic tissue   Wound Assessment and Plan  Wound Therapy - Assess/Plan/Recommendations Wound Therapy - Clinical Statement: Yellow, tan necrotic tissue continues to be tightly adhered to wound bed, and only minimal amount of debridement was completed. Necrotic tissue again scored and santyl applied with moist gauze dressing to improve enzymatic debridement. Pt will benefit from continued hydrotherapy and enzymatic debridement paired with selective debridement to reduce bioburden and promote wound bed healing.  Wound Therapy - Functional Problem List: Decreased tolerance for OOB and position changes Factors Delaying/Impairing Wound Healing: Diabetes Mellitus;Immobility;Multiple medical problems Hydrotherapy Plan: Debridement;Dressing change;Patient/family education;Pulsatile lavage with suction Wound Therapy - Frequency: 6X / week Wound Therapy - Follow Up Recommendations: Other (comment)(CIR) Wound Plan: See above  Wound Therapy Goals- Improve the function of patient's integumentary system by progressing the wound(s) through the phases of wound healing (inflammation - proliferation - remodeling) by: Decrease Necrotic Tissue to: 0% Decrease Necrotic Tissue - Progress: Progressing toward goal Increase Granulation Tissue to: 100% Increase Granulation Tissue - Progress: Progressing toward goal Goals/treatment plan/discharge plan were made with and agreed upon by patient/family:  Yes Time For Goal Achievement: 7 days Wound Therapy - Potential for  Goals: Good  Goals will be updated until maximal potential achieved or discharge criteria met.  Discharge criteria: when goals achieved, discharge from hospital, MD decision/surgical intervention, no progress towards goals, refusal/missing three consecutive treatments without notification or medical reason.  GP    [name] [name]. [name] [name] PT, DPT Acute Rehabilitation  [phone] Pager [phone]   [place] 09/11/2017, 9:33 AM

## 2017-09-12 ENCOUNTER — Inpatient Hospital Stay (HOSPITAL_COMMUNITY): Payer: PPO | Admitting: Occupational Therapy

## 2017-09-12 ENCOUNTER — Inpatient Hospital Stay (HOSPITAL_COMMUNITY): Payer: PPO

## 2017-09-12 LAB — COMPREHENSIVE METABOLIC PANEL
ALBUMIN: 2.4 g/dL — AB (ref 3.5–5.0)
ALK PHOS: 85 U/L (ref 38–126)
ALT: 21 U/L (ref 0–44)
ANION GAP: 7 (ref 5–15)
AST: 27 U/L (ref 15–41)
BILIRUBIN TOTAL: 1.1 mg/dL (ref 0.3–1.2)
BUN: 18 mg/dL (ref 8–23)
CALCIUM: 8.9 mg/dL (ref 8.9–10.3)
CO2: 28 mmol/L (ref 22–32)
Chloride: 102 mmol/L (ref 98–111)
Creatinine, Ser: 1.23 mg/dL (ref 0.61–1.24)
GFR calc Af Amer: >60 mL/min (ref >60)
GFR calc non Af Amer: 59 mL/min — ABNORMAL LOW (ref >60)
GLUCOSE: 94 mg/dL (ref 70–99)
Potassium: 3.8 mmol/L (ref 3.5–5.1)
SODIUM: 137 mmol/L (ref 135–145)
TOTAL PROTEIN: 7.6 g/dL (ref 6.5–8.1)

## 2017-09-12 LAB — GLUCOSE, CAPILLARY
GLUCOSE-CAPILLARY: 98 mg/dL (ref 70–99)
GLUCOSE-CAPILLARY: 155 mg/dL — AB (ref 70–99)
GLUCOSE-CAPILLARY: 84 mg/dL (ref 70–99)
Glucose-Capillary: 86 mg/dL (ref 70–99)

## 2017-09-12 LAB — MAGNESIUM: Magnesium: 2.1 mg/dL (ref 1.7–2.4)

## 2017-09-12 LAB — LACTATE DEHYDROGENASE: LDH: 180 U/L (ref 98–192)

## 2017-09-12 LAB — CBC
HEMATOCRIT: 31.1 % — AB (ref 39.0–52.0)
HEMOGLOBIN: 9.4 g/dL — AB (ref 13.0–17.0)
MCH: 25.1 pg — ABNORMAL LOW (ref 26.0–34.0)
MCHC: 30.2 g/dL (ref 30.0–36.0)
MCV: 82.9 fL (ref 78.0–100.0)
Platelets: 252 10*3/uL (ref 150–400)
RBC: 3.75 MIL/uL — ABNORMAL LOW (ref 4.22–5.81)
RDW: 18.2 % — ABNORMAL HIGH (ref 11.5–15.5)
WBC: 6.2 10*3/uL (ref 4.0–10.5)

## 2017-09-12 LAB — PROTIME-INR
INR: 2.31
PROTHROMBIN TIME: 25.2 s — AB (ref 11.4–15.2)

## 2017-09-12 MED ORDER — WARFARIN SODIUM 3 MG PO TABS
3.0000 mg | ORAL_TABLET | Freq: Once | ORAL | Status: AC
Start: 2017-09-12 — End: 2017-09-12
  Administered 2017-09-12: 3 mg via ORAL
  Filled 2017-09-12: qty 1

## 2017-09-12 MED ORDER — WARFARIN SODIUM 4 MG PO TABS: 4.0000 mg | ORAL_TABLET | Freq: Once | ORAL | Status: DC

## 2017-09-12 MED ORDER — TRAMADOL HCL 50 MG PO TABS
50.0000 mg | ORAL_TABLET | Freq: Every day | ORAL | Status: DC | PRN
Start: 2017-09-12 — End: 2017-09-13
  Administered 2017-09-12: 50 mg via ORAL
  Filled 2017-09-12: qty 1

## 2017-09-12 NOTE — Progress Notes (Signed)
Physical Therapy Session Note  Patient Details  Name: [name] [name] MRN: [id number] Date of Birth: 1950/11/04  Today's Date: 09/12/2017 PT Individual Time: 1035-1105 PT Individual Time Calculation (min): 30 min   Short Term Goals: Week 1:  PT Short Term Goal 1 (Week 1): Pt will ambulate 25' w/ min assist PT Short Term Goal 2 (Week 1): Pt will maintain sternal precautions during functional mobility w/ min cues 50% of the time PT Short Term Goal 3 (Week 1): Pt will perform bed<>chair transfer w/ min assist PT Short Term Goal 4 (Week 1): Pt will demonstrate safe transition of LVAD between battery and wall power w/o cues 50% of the time  Skilled Therapeutic Interventions/Progress Updates:   Pt soundly asleep in bed, and did not awaken to name or use of wet wash cloth to face, by PT.  Dtr stated that she believed pt had had pain meds which made him sleepy, but Threasa Beards, RN checked and pt had not had pain meds since yesterday, 8am.    PROM bil hips, knees and ankles. Pt actively resisted PROM at the ankles.  His dtr stated that his feet normally are too painful from neuropathy to tolerate being touched, but he denied that his feet hurt.  Pt intermittently opened his eyes briefly but did not focus on PT's or dtr's faces; he whispered a few things to his dtr about buttocks pain, needing to rest, etc.  On command with multimodal cues, pt actively participated in abducting/adducting R/L LE off/on bed.  He echoed PT counting aloud occasionally.  Pt nodded yes that he wanted to roll to his R side; pt positioned with pillows and towel roll behind back. Pt verbalized " I have to pee" and proceeded to do so; indwelling catheter in place.  Pt left resting in bed at 45 degrees HOB, with call bell at hand, and dtr present.    Therapy Documentation Precautions:  Precautions Precautions: Sternal, Fall Precaution Comments: LVAD, sacral wound Restrictions Weight Bearing Restrictions: No    Pain: Pain Assessment Buttocks; unable to rate Re-positioned      See Function Navigator for Current Functional Status.   Therapy/Group: Individual Therapy  Mariaelena Cade 09/12/2017, 11:18 AM

## 2017-09-12 NOTE — Progress Notes (Signed)
Occupational Therapy Session Note  Patient Details  Name: [name] [name] MRN: [id number] Date of Birth: Mar 24, 1950  Today's Date: 09/12/2017 OT Individual Time: 2820-6015 OT Individual Time Calculation (min): 57 min   Short Term Goals: Week 1:  OT Short Term Goal 1 (Week 1): Pt will complete toilet transfer with Min A and LRAD OT Short Term Goal 2 (Week 1): Pt will complete bathing with Min A and sit<stand level OT Short Term Goal 3 (Week 1): Pt will don shirt with supervision   Skilled Therapeutic Interventions/Progress Updates:    Pt greeted in recliner with breakfast. Reported feeling much better/more alert compared to yesterday. During remainder of meal, he required vcs for chin tuck when consuming beverages and food items. Discussed with pt/spouse ELOS, OT goals, DME needs, and f/u post d/c. Afterwards pt reported need to void bowels. Stand pivot>BSC completed with steady assist and instruction on technique. He was able to complete perihygiene while standing however required assist for thoroughness post large BM. Pt afterwards standing with steady assist for 3 minutes in order for RN to change sacral dressing. Once he transferred back to recliner, UB/LB dressing was completed at sit<stand level with RW. Pt able to pull pants over hips when standing with device and balance support. At end of tx pt was left in recliner with all needs and spouse present.   Therapy Documentation Precautions:  Precautions Precautions: Sternal, Fall Precaution Comments: LVAD, sacral wound Restrictions Weight Bearing Restrictions: No Pain: RN present at start of session to provide pain medicine    ADL: ADL ADL Comments: Please see functional navigator for ADL status     See Function Navigator for Current Functional Status.   Therapy/Group: Individual Therapy  Liseth Wann A Normagene Harvie 09/12/2017, 9:00 AM

## 2017-09-12 NOTE — Progress Notes (Signed)
[place] for coumadin Indication: LVAD  Allergies  Allergen Reactions  . Penicillins Hives, Itching and Other (See Comments)    Has patient had a PCN reaction causing immediate rash, facial/tongue/throat swelling, SOB or lightheadedness with hypotension:# # Yes # # Has patient had a PCN reaction causing severe rash involving mucus membranes or skin necrosis: No Has patient had a PCN reaction that required hospitalization: No Has patient had a PCN reaction occurring within the last 10 years: No If all of the above answers are "NO", then may proceed with Cephalosporin use.     Patient Measurements: Height: 5\' 11"  (180.3 cm) Weight: 220 lb (99.8 kg) IBW/kg (Calculated) : 75.3 Heparin Dosing Weight: 98.3 kg  Vital Signs: Temp: 98.6 F (37 C) (07/14 0458) Temp Source: Oral (07/14 0458) BP: 96/83 (07/14 0458) Pulse Rate: 62 (07/14 0458)  Labs: Recent Labs    09/10/17 0500 09/11/17 0306 09/12/17 0339  HGB 9.2* 9.2* 9.4*  HCT 30.1* 31.0* 31.1*  PLT 274 294 252  LABPROT 21.8* 23.0* 25.2*  INR 1.92 2.05 2.31  CREATININE 1.19 1.28* 1.23    Estimated Creatinine Clearance: 70.1 mL/min (by C-G formula based on SCr of 1.23 mg/dL).   Medical History: Past Medical History:  Diagnosis Date  . AICD (automatic cardioverter/defibrillator) present 02/05/2014   Upgrade to Medtronic biventricular ICD, serial number  [id number]   . Atrial flutter ([place]) 04/2012   s/p TEE-EPS+RFCA 04/2012  . CAD (coronary artery disease) 7517,0017 X 2    RCA-T, 70% PL (off CFX), 99% Prox LAD/90% Dist LAD, S/P TAXUS stent x 2  . CHF (congestive heart failure) ([place])   . Chronic anticoagulation   . Chronic systolic heart failure ([place])   . CKD (chronic kidney disease)   . Diabetic retinopathy ([place])   . DM type 2 (diabetes mellitus, type 2) ([facility])    insulin dependent  . HTN (hypertension)   . Hypercholesteremia    ablation  . ICD (implantable cardiac  defibrillator) in place   . Ischemic cardiomyopathy March 2015   20-25% 2D   . Nephrolithiasis   . Ventricular tachycardia [facility])     Assessment: 26 yy.o M who underwent LVAD placement on 6/17 currently on warfarin per Rx.  Today, 09/12/2017: - INR is therapeutic at 2.31 - cbc stable - no bleeding documented - no significant drug-drug intxns  Goal of Therapy:  INR goal 2-2.5 Monitor platelets by anticoagulation protocol: Yes   Plan:   - Warfarin 3 mg tonight. - Monitor s/s bleeding - Will provide warfarin education prior to discharge  Alanda Slim, PharmD, [facility] 09/12/2017 8:03 AM

## 2017-09-12 NOTE — Progress Notes (Signed)
[place] PHYSICAL MEDICINE & REHABILITATION     PROGRESS NOTE    Subjective/Complaints: Received dose of oxycodone yesterday morning prior to hydrotherapy and was lethargic most of the day.  Nurse provided the medication to the patient prior to seeing the new orders for me.  Now feeling better this morning.  ROS: Patient denies fever, rash, sore throat, blurred vision, nausea, vomiting, diarrhea, cough, shortness of breath or chest pain, joint or back pain, headache, or mood change.   Objective:  No results found. Recent Labs    09/11/17 0306 09/12/17 0339  WBC 5.7 6.2  HGB 9.2* 9.4*  HCT 31.0* 31.1*  PLT 294 252   Recent Labs    09/11/17 0306 09/12/17 0339  NA 136 137  K 3.9 3.8  CL 100 102  GLUCOSE 124* 94  BUN 20 18  CREATININE 1.28* 1.23  CALCIUM 8.7* 8.9   CBG (last 3)  Recent Labs    09/11/17 1655 09/11/17 2129 09/12/17 0638  GLUCAP 122* 163* 86    Wt Readings from Last 3 Encounters:  09/12/17 99.8 kg (220 lb)  09/10/17 97.8 kg (215 lb 8 oz)  07/22/17 103.9 kg (229 lb)     Intake/Output Summary (Last 24 hours) at 09/12/2017 0802 Last data filed at 09/12/2017 0500 Gross per 24 hour  Intake 342 ml  Output 2150 ml  Net -1808 ml    Vital Signs: Blood pressure 96/83, pulse 62, temperature 98.6 F (37 C), temperature source Oral, resp. rate 18, height 5\' 11"  (1.803 m), weight 99.8 kg (220 lb), SpO2 97 %. Physical Exam:  Constitutional: No distress . Vital signs reviewed. HEENT: EOMI, oral membranes moist, dysphonic voice Neck: supple Cardiovascular: Home Respiratory: CTA Bilaterally without wheezes or rales. Normal effort    GI: BS +, non-tender, non-distended  Musculoskeletal:  PICC line in place RUR. 1+l edema BLE.  Neurological: He is oriented to person, place, and time. No cranial nerve deficit.  Pt slow to arouse. UE 4- to 4/5 prox to distal. LE: 2+/5 HF, 3/5 KE and 4/5 ADF/PF. No sensory deficits. Fair insight and awareness. Slow to  process  Skin: Skin is warm. Sacral wound superficial but with layer of fibronecrotic tissue on exterior---wound approximrately 2" in diameter extending into gluteal cleft.  Stable   Dry dressing on abdomen.  Psychiatric: He has a normal mood and affect. His behavior is normal.     Assessment/Plan: 1. Functional deficits secondary to debility after CHF/LVAD/VDRF which require 3+ hours per day of interdisciplinary therapy in a comprehensive inpatient rehab setting. Physiatrist is providing close team supervision and 24 hour management of active medical problems listed below. Physiatrist and rehab team continue to assess barriers to discharge/monitor patient progress toward functional and medical goals.  Function:  Bathing Bathing position   Position: Sitting EOB  Bathing parts Body parts bathed by patient: Right arm, Left arm, Chest, Abdomen, Front perineal area, Right upper leg, Left upper leg Body parts bathed by helper: Buttocks, Right lower leg, Left lower leg, Back  Bathing assist        Upper Body Dressing/Undressing Upper body dressing   What is the patient wearing?: Hospital gown                Upper body assist Assist Level: Touching or steadying assistance(Pt > 75%)      Lower Body Dressing/Undressing Lower body dressing   What is the patient wearing?: Non-skid slipper socks  Non-skid slipper socks- Performed by helper: Don/doff right sock, Don/doff left sock                  Lower body assist        Toileting Toileting Toileting activity did not occur: No continent bowel/bladder event        Toileting assist     Transfers Chair/bed transfer Chair/bed transfer activity did not occur: Refused           Locomotion Ambulation Ambulation activity did not occur: Scientist, research (physical sciences) activity did not occur: Refused        Cognition Comprehension Comprehension assist level: Follows complex  conversation/direction with extra time/assistive device  Expression Expression assist level: Expresses basic 50 - 74% of the time/requires cueing 25 - 49% of the time. Needs to repeat parts of sentences.  Social Interaction Social Interaction assist level: Interacts appropriately 90% of the time - Needs monitoring or encouragement for participation or interaction.  Problem Solving Problem solving assist level: Solves basic 50 - 74% of the time/requires cueing 25 - 49% of the time  Memory Memory assist level: Recognizes or recalls 50 - 74% of the time/requires cueing 25 - 49% of the time   Medical Problem List and Plan:  1. Functional and mobility deficits secondary to debility after congestive heart failure/LVAD/VDRF and multiple other medical issues  -beginning therapies  2. DVT Prophylaxis/Anticoagulation: Pharmaceutical: Coumadin INR therapeutic 3. Pain Management: tylenol prn.   -Tramadol for hydrotherapy only 4. Mood: LCSW to follow for evaluation and support.  5. Neuropsych: This patient is capable of making decisions on his own behalf.  6. Deep tissue injury bilateral buttocks/Skin/Wound Care: Air mattress overlay. Boost when in chair. Hydrotherapy to buttocks M->Sa.   -try tramadol in place of oxycodone prior to hydrotherapy 7. Fluids/Electrolytes/Nutrition: Strict I/O. Continue nutritional supplement to promote wound healing due due to low albumin levels.   -Labs reviewed and are within normal range today 8. T2DM: Monitor BS ac/hs. Continue Lantus  9. A fib: Off amiodarone. Monitor HR bid  10. 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 Started on [facility] 7/12. Continue Spironolactone and digoxin daily. Monitor renal status serially.  11. [name]/VF: S/p emergent cardioversion 6/24 and 6/26-->ICD turned on 7/12. To keep K> 4.0 and Mg> 2.0  12. ABLA: Improved with transfusion.  Hemoglobin 9.4 on 7/14 13. Dysphagia: Continue dysphagia 2, honey liquids with chin tuck. Hypernatremia  has resolved.  14. LVAD implant 6/17: On coumadin and low dose ASA. Education ongoing with patient and wife. Dressing changes per VAD coordinator or trained caregiver.   LOS (Days) [place] EVALUATION WAS PERFORMED  Meredith Staggers, MD 09/12/2017 8:02 AM

## 2017-09-12 NOTE — Progress Notes (Signed)
Patient ID: MIRKO TAILOR, male   DOB: 03/07/1950, 67 y.o.   MRN: [id number]     Advanced Heart Failure Rounding Note  PCP-Cardiologist: No primary care provider on file.   Subjective:    Events 6/17 Underwent HM-3 implant -> Unable to close chest with RV failure and high intrathoracic pressure 6/18 Taken back to OR for evacuation of hematoma. NO RVAD needed. Left sternum open  08/19/17 Taken back to OR for sternal closure 6/22 VT => amiodarone started.  6/24 VT/VF=>urgent cardioversion. 6/25 VT  =>urgent cardioversion  6/27 Extubated 7/12 to CIR  Slow progress with PT.  Main complaint is pain for decubitus ulcer.  MAP stable.  Weight stable.   LVAD Interrogation HM 3: Speed: 5300 Flow: 4 PI: 4.5 Power: 3.7. VAD interrogated personally. Parameters stable.   Objective:   Weight Range: 220 lb (99.8 kg) Body mass index is 30.68 kg/m.   Vital Signs:   Temp:  [97.6 F (36.4 C)-99 F (37.2 C)] 98.6 F (37 C) (07/14 0458) Pulse Rate:  [62-90] 62 (07/14 0458) Resp:  [14-18] 18 (07/14 0458) BP: (85-113)/(66-84) 96/83 (07/14 0458) SpO2:  [94 %-97 %] 97 % (07/14 0458) Weight:  [220 lb (99.8 kg)] 220 lb (99.8 kg) (07/14 0300) Last BM Date: 09/11/17   MAP 88-93  Weight change: Filed Weights   09/10/17 1850 09/11/17 0410 09/12/17 0300  Weight: 223 lb (101.2 kg) 219 lb (99.3 kg) 220 lb (99.8 kg)   Intake/Output:   Intake/Output Summary (Last 24 hours) at 09/12/2017 1134 Last data filed at 09/12/2017 0500 Gross per 24 hour  Intake 222 ml  Output 2150 ml  Net -1928 ml   Physical Exam    Physical Exam: General: Well appearing this am. NAD.  HEENT: Normal. Neck: Supple, JVP 7-8 cm. Carotids OK.  Cardiac:  Mechanical heart sounds with LVAD hum present.  Lungs:  CTAB, normal effort.  Abdomen:  NT, ND, no HSM. No bruits or masses. +BS  LVAD exit site: Well-healed and incorporated. Dressing dry and intact. No erythema or drainage. Stabilization device present and  accurately applied. Driveline dressing changed daily per sterile technique. Extremities:  Warm and dry. No cyanosis, clubbing, rash, or edema.  Neuro:  Alert & oriented x 3. Cranial nerves grossly intact. Moves all 4 extremities w/o difficulty. Affect pleasant     Telemetry   Not on telemetry  Labs    CBC Recent Labs    09/11/17 0306 09/12/17 0339  WBC 5.7 6.2  NEUTROABS 4.1  --   HGB 9.2* 9.4*  HCT 31.0* 31.1*  MCV 84.0 82.9  PLT 294 034   Basic Metabolic Panel Recent Labs    09/11/17 0306 09/12/17 0339  NA 136 137  K 3.9 3.8  CL 100 102  CO2 26 28  GLUCOSE 124* 94  BUN 20 18  CREATININE 1.28* 1.23  CALCIUM 8.7* 8.9  MG 2.0 2.1   Liver Function Tests Recent Labs    09/11/17 0306 09/12/17 0339  AST 26 27  ALT 24 21  ALKPHOS 77 85  BILITOT 1.0 1.1  PROT 7.7 7.6  ALBUMIN 2.4* 2.4*   No results for input(s): LIPASE, AMYLASE in the last 72 hours. Cardiac Enzymes No results for input(s): CKTOTAL, CKMB, CKMBINDEX, TROPONINI in the last 72 hours.  BNP: BNP (last 3 results) Recent Labs    08/23/17 0024 08/30/17 0000 09/05/17 2327  BNP 450.5* 114.6* 86.1    ProBNP (last 3 results) Recent Labs    11/12/16  1559  PROBNP 388.0*     D-Dimer No results for input(s): DDIMER in the last 72 hours. Hemoglobin A1C No results for input(s): HGBA1C in the last 72 hours. Fasting Lipid Panel No results for input(s): CHOL, HDL, LDLCALC, TRIG, CHOLHDL, LDLDIRECT in the last 72 hours. Thyroid Function Tests No results for input(s): TSH, T4TOTAL, T3FREE, THYROIDAB in the last 72 hours.  Invalid input(s): FREET3  Other results:   Imaging    No results found.   Medications:     Scheduled Medications: . aspirin  81 mg Oral Daily  . bisacodyl  10 mg Oral Daily   Or  . bisacodyl  10 mg Rectal Daily  . Chlorhexidine Gluconate Cloth  6 each Topical QHS  . collagenase   Topical Daily  . famotidine  20 mg Oral BID  . insulin aspart  0-15 Units  Subcutaneous TID WC  . insulin aspart  0-5 Units Subcutaneous QHS  . insulin detemir  25 Units Subcutaneous QHS  . magnesium oxide  400 mg Oral BID  . mouth rinse  15 mL Mouth Rinse BID  . multivitamin with minerals  1 tablet Oral Daily  . nystatin  5 mL Oral QID  . potassium chloride  40 mEq Oral Daily  . protein supplement shake  11 oz Oral BID BM  . sodium chloride flush  10-40 mL Intracatheter Q12H  . spironolactone  25 mg Oral Daily  . torsemide  60 mg Oral Daily  . vitamin C  500 mg Oral BID  . warfarin  3 mg Oral ONCE-1800  . Warfarin - Pharmacist Dosing Inpatient   Does not apply q1800    Infusions: . lactated ringers      PRN Medications: acetaminophen, alum & mag hydroxide-simeth, bisacodyl, clonazepam, diphenhydrAMINE, guaiFENesin-dextromethorphan, lactated ringers, levalbuterol, polyethylene glycol, prochlorperazine **OR** prochlorperazine **OR** prochlorperazine, RESOURCE THICKENUP CLEAR, sodium chloride flush, sodium phosphate, traMADol, traZODone    Patient Profile  CONSTANTINO STARACE is a 67 y.o. 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   Directly admitted with persistent low cardiac output for milrinone initiation for home.  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 Acute/Chronic systolic CHF with biventricular failure-> cardiogenic shock: Echo  08/13/2017 EF 20-25%. s/p  Medtronic BiV ICD in place. Cath 12/18 with stable 1v CAD.  s/p HM-3 implant 6/17. Unable to close chest due to high-intrathoracic pressures and RV failure. Taken back to OR 08/17/17 with evacuation of hematoma with improvement. Extubated 6/28.  Ramp ECHO 7/11 with speed increased to 5300. Volume looks ok today.  - Continue torsemide 60 mg daily today.   - Continue spiro 25 mg daily.  2. 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 LDH 180. Ramp ECHO speed increased to 5300.  VAD  interrogated personally. Parameters stable. - Continue warfarin + ASA 81, INR 2.3.  3. 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  4. [name]/VF: Recurrent VT on 6/22.  S/P VF 6/24 emergent bedside cardioversion. S/P VT 6/26 emergent bedside cardioversion. ICD turned back on.   5. AFL/atrial fibrillation: S/p previous ablation. He is currently in atrial fibrillation.  - Rate controlled. Amio stopped.  - on coumadin.  6. [name]: Recent cath with stable CAD as above: No chest pain.  7. [name]: Recent A1c 8.3 on 6/3.  -continue current  regimen.  8. Anemia: Post-op.  Got 1 unit PRBCs 6/22 and 6/25. - Stable. Hgb 9.4  9. Deconditioning: Severe, working with PT aggressively.   10. Unstageable Pressure Ulcer: R and L buttock.  R buttock with necrosis noted. Continue santyl for selective debridement. Change daily. Continue air overlay. Needs to reposition every hour when in the chair. Keep R /L in bed. WOC has seen. May need a different chair cushion. Added hydrotherapy.    Length of Stay: 2  Loralie Champagne, MD  09/12/2017, 11:34 AM  Advanced Heart Failure Team Pager [phone] (M-F; 7a - 4p)  Please contact [facility] for night-coverage after hours (4p -7a ) and weekends on [email or web address]

## 2017-09-13 ENCOUNTER — Inpatient Hospital Stay (HOSPITAL_COMMUNITY): Payer: PPO | Admitting: Speech Pathology

## 2017-09-13 ENCOUNTER — Inpatient Hospital Stay (HOSPITAL_COMMUNITY): Payer: PPO | Admitting: Occupational Therapy

## 2017-09-13 ENCOUNTER — Inpatient Hospital Stay (HOSPITAL_COMMUNITY): Payer: PPO | Admitting: Physical Therapy

## 2017-09-13 LAB — CBC
HCT: 31.3 % — ABNORMAL LOW (ref 39.0–52.0)
HEMOGLOBIN: 9.5 g/dL — AB (ref 13.0–17.0)
MCH: 25.3 pg — ABNORMAL LOW (ref 26.0–34.0)
MCHC: 30.4 g/dL (ref 30.0–36.0)
MCV: 83.2 fL (ref 78.0–100.0)
Platelets: 232 10*3/uL (ref 150–400)
RBC: 3.76 MIL/uL — AB (ref 4.22–5.81)
RDW: 18.3 % — ABNORMAL HIGH (ref 11.5–15.5)
WBC: 6.6 10*3/uL (ref 4.0–10.5)

## 2017-09-13 LAB — PROTIME-INR
INR: 2.31
PROTHROMBIN TIME: 25.2 s — AB (ref 11.4–15.2)

## 2017-09-13 LAB — COMPREHENSIVE METABOLIC PANEL
ALBUMIN: 2.4 g/dL — AB (ref 3.5–5.0)
ALK PHOS: 83 U/L (ref 38–126)
ALT: 22 U/L (ref 0–44)
AST: 25 U/L (ref 15–41)
Anion gap: 10 (ref 5–15)
BUN: 20 mg/dL (ref 8–23)
CALCIUM: 8.7 mg/dL — AB (ref 8.9–10.3)
CO2: 27 mmol/L (ref 22–32)
CREATININE: 1.2 mg/dL (ref 0.61–1.24)
Chloride: 99 mmol/L (ref 98–111)
GFR calc Af Amer: >60 mL/min (ref >60)
GFR calc non Af Amer: >60 mL/min (ref >60)
GLUCOSE: 95 mg/dL (ref 70–99)
Potassium: 3.6 mmol/L (ref 3.5–5.1)
SODIUM: 136 mmol/L (ref 135–145)
Total Bilirubin: 1.1 mg/dL (ref 0.3–1.2)
Total Protein: 7.8 g/dL (ref 6.5–8.1)

## 2017-09-13 LAB — GLUCOSE, CAPILLARY
GLUCOSE-CAPILLARY: 135 mg/dL — AB (ref 70–99)
GLUCOSE-CAPILLARY: 110 mg/dL — AB (ref 70–99)
GLUCOSE-CAPILLARY: 158 mg/dL — AB (ref 70–99)
Glucose-Capillary: 70 mg/dL (ref 70–99)
Glucose-Capillary: 75 mg/dL (ref 70–99)

## 2017-09-13 LAB — BRAIN NATRIURETIC PEPTIDE: B Natriuretic Peptide: 53.3 pg/mL (ref 0.0–100.0)

## 2017-09-13 LAB — LACTATE DEHYDROGENASE: LDH: 177 U/L (ref 98–192)

## 2017-09-13 LAB — MAGNESIUM: Magnesium: 1.9 mg/dL (ref 1.7–2.4)

## 2017-09-13 MED ORDER — ACETAMINOPHEN-CODEINE #3 300-30 MG PO TABS
1.0000 | ORAL_TABLET | Freq: Every day | ORAL | Status: DC | PRN
  Administered 2017-09-13 – 2017-09-25 (×10): 1 via ORAL
  Filled 2017-09-13 (×11): qty 1

## 2017-09-13 MED ORDER — WARFARIN SODIUM 3 MG PO TABS
3.0000 mg | ORAL_TABLET | Freq: Once | ORAL | Status: AC
  Administered 2017-09-13: 3 mg via ORAL
  Filled 2017-09-13: qty 1

## 2017-09-13 MED ORDER — ACETAMINOPHEN 325 MG PO TABS: 650.0000 mg | ORAL_TABLET | Freq: Every day | ORAL | Status: DC | PRN

## 2017-09-13 NOTE — Care Management (Signed)
[place] Individual Statement of Services  Patient Name:  [name] [name]  Date:  09/13/2017  Welcome to the [facility].  Our goal is to provide you with an individualized program based on your diagnosis and situation, designed to meet your specific needs.  With this comprehensive rehabilitation program, you will be expected to participate in at least 3 hours of rehabilitation therapies Monday-Friday, with modified therapy programming on the weekends.  Your rehabilitation program will include the following services:  Physical Therapy (PT), Occupational Therapy (OT), Speech Therapy (ST), 24 hour per day rehabilitation nursing, Therapeutic Recreaction (TR), Neuropsychology, Case Management (Social Worker), Rehabilitation Medicine, Nutrition Services and Pharmacy Services  Weekly team conferences will be held on Tuesdays to discuss your progress.  Your Social Worker will talk with you frequently to get your input and to update you on team discussions.  Team conferences with you and your family in attendance may also be held.  Expected length of stay: 14-16 days   Overall anticipated outcome: supervision  Depending on your progress and recovery, your program may change. Your Social Worker will coordinate services and will keep you informed of any changes. Your Social Worker's name and contact numbers are listed  below.  The following services may also be recommended but are not provided by the [place] will be made to provide these services after discharge if needed.  Arrangements include referral to agencies that provide these services.  Your insurance has been verified to be:  [employer] Your primary doctor is:  [place]  Pertinent information will be shared with your doctor  and your insurance company.  Social Worker:  [place], [place] or ([phone]   Information discussed with and copy given to patient by: Lennart Pall, 09/13/2017, 3:24 PM

## 2017-09-13 NOTE — Progress Notes (Signed)
Speech Language Pathology Daily Session Note  Patient Details  Name: [name] [name] MRN: [id number] Date of Birth: 1950-10-19  Today's Date: 09/13/2017 SLP Individual Time: 0815-0900 SLP Individual Time Calculation (min): 45 min  Short Term Goals: Week 1: SLP Short Term Goal 1 (Week 1): Pt will consume dys 2 textures and honey thick liquids with supervision cues for use of swallowing precautions and minimal overt s/s of aspiration.  SLP Short Term Goal 2 (Week 1): Pt will consume therapeutic trials of ice chips with supervision cues for use of swallowing precautions and minimal overt s/s of aspiration.  SLP Short Term Goal 3 (Week 1): Pt will recall new information with min cues for use of external aids.   SLP Short Term Goal 4 (Week 1): Pt will complete basic familiar tasks with min cues for functional problem solving.   SLP Short Term Goal 5 (Week 1): Pt will return demonstration of EMST trainer with mod I and for 25 repetitions a self reported effort level of <7/10.  SLP Short Term Goal 6 (Week 1): Pt will increase vocal intensity to achieve intelligibility at the sentence level with min assist.   Skilled Therapeutic Interventions: Skilled treatment session focused on dysphagia and communication goals. SLP facilitated session by providing skilled observation of pt consuming dysphagia 1 breakfast tray with honey thick liquids. Pt was Mod I (even with moderate distractions present) when using chin tuck with sips of honey thick liquids. Pt able to complete set of 10 on EMST trainer with Mod I cues. Pt would benefit from formal cognitive evaluation at next available session. Pt's voice remains aphonic and despite attempts at increased vocal intensity, pt's speech intelligibility is ~ 40% at phrase level. Pt was left upright in recliner with all needs within reach. Continue per current plan of care.      Function:  Eating Eating   Modified Consistency Diet: Yes Eating Assist Level:  Supervision or verbal cues           Cognition Comprehension Comprehension assist level: Follows basic conversation/direction with extra time/assistive device  Expression   Expression assist level: Expresses basic needs/ideas: With no assist  Social Interaction Social Interaction assist level: Interacts appropriately 75 - 89% of the time - Needs redirection for appropriate language or to initiate interaction.  Problem Solving Problem solving assist level: Solves basic 75 - 89% of the time/requires cueing 10 - 24% of the time  Memory Memory assist level: Recognizes or recalls 75 - 89% of the time/requires cueing 10 - 24% of the time    Pain Pain Assessment Pain Scale: Faces Pain Score: 3  Faces Pain Scale: Hurts a little bit Pain Type: Acute pain Pain Location: Buttocks Pain Orientation: Right;Left Pain Descriptors / Indicators: Sore Pain Onset: On-going Patients Stated Pain Goal: 2 Pain Intervention(s): Repositioned Multiple Pain Sites: No  Therapy/Group: Individual Therapy  Ellanor Feuerstein 09/13/2017, 2:08 PM

## 2017-09-13 NOTE — Progress Notes (Signed)
Physical Therapy Wound Treatment Patient Details  Name: [name] [name] MRN: [id number] Date of Birth: 04-06-1950  Today's Date: 09/13/2017 PT Individual Time: 3888-2800 PT Individual Time Calculation (min): 37 min   Subjective  Subjective: Agreeable to therapy. Patient and Family Stated Goals: Heal wound Prior Treatments: Wet to dry dressing changes with Santyl  Pain Score:  Pt was premedicated prior to session. Noted min-mod pain with treatment evidenced by pt clenching gluteals and moving away from stimuli during treatment.  Wound Assessment  Pressure Injury 08/27/17 Unstageable - Full thickness tissue loss in which the base of the ulcer is covered by slough (yellow, tan, gray, green or brown) and/or eschar (tan, brown or black) in the wound bed. 8.5 x 6 (90% darkened 5% yellow and 5% pink (Active)  Wound Image   09/10/2017 10:51 AM  Dressing Type ABD;Barrier Film (skin prep);Gauze (Comment);Moist to dry 09/13/2017 10:27 AM  Dressing Changed;Clean;Dry;Intact 09/13/2017 10:27 AM  Dressing Change Frequency Daily 09/13/2017 10:27 AM  State of Healing Eschar 09/13/2017 10:27 AM  Site / Wound Assessment Pink;Yellow;Brown 09/13/2017 10:27 AM  % Wound base Red or Granulating 20% 09/13/2017 10:27 AM  % Wound base Yellow/Fibrinous Exudate 60% 09/13/2017 10:27 AM  % Wound base Black/Eschar 20% 09/13/2017 10:27 AM  Peri-wound Assessment Intact 09/13/2017 10:27 AM  Wound Length (cm) 9.1 cm 09/10/2017 10:51 AM  Wound Width (cm) 7.5 cm 09/10/2017 10:51 AM  Wound Depth (cm) 0.2 cm 09/10/2017 10:51 AM  Wound Surface Area (cm^2) 68.25 cm^2 09/10/2017 10:51 AM  Wound Volume (cm^3) 13.65 cm^3 09/10/2017 10:51 AM  Tunneling (cm) 0 09/10/2017 10:51 AM  Undermining (cm) 0 09/10/2017 10:51 AM  Margins Unattached edges (unapproximated) 09/13/2017 10:27 AM  Drainage Amount Moderate 09/13/2017 10:27 AM  Drainage Description Purulent 09/13/2017 10:27 AM  Treatment Debridement (Selective);Hydrotherapy (Pulse lavage);Packing  (Saline gauze) 09/13/2017 10:27 AM   Santyl applied to wound bed prior to applying dressing.     Hydrotherapy Pulsed lavage therapy - wound location: R buttock Pulsed Lavage with Suction (psi): 8 psi Pulsed Lavage with Suction - Normal Saline Used: 1000 mL Pulsed Lavage Tip: Tip with splash shield Selective Debridement Selective Debridement - Location: R buttock Selective Debridement - Tools Used: Forceps;Scalpel Selective Debridement - Tissue Removed: Owens Shark and yellow necrotic tissue   Wound Assessment and Plan  Wound Therapy - Assess/Plan/Recommendations Wound Therapy - Clinical Statement: Necrotic tissue on wound bed starting to loosen up, and debridement was more successful this session. Pt will benefit from continued hydrotherapy and selective debridement to reduce bioburden and promote wound bed healing.  Wound Therapy - Functional Problem List: Decreased tolerance for OOB and position changes Factors Delaying/Impairing Wound Healing: Diabetes Mellitus;Immobility;Multiple medical problems Hydrotherapy Plan: Debridement;Dressing change;Patient/family education;Pulsatile lavage with suction Wound Therapy - Frequency: 6X / week Wound Therapy - Follow Up Recommendations: Other (comment)(CIR) Wound Plan: See above  Wound Therapy Goals- Improve the function of patient's integumentary system by progressing the wound(s) through the phases of wound healing (inflammation - proliferation - remodeling) by: Decrease Necrotic Tissue to: 0% Decrease Necrotic Tissue - Progress: Progressing toward goal Increase Granulation Tissue to: 100% Increase Granulation Tissue - Progress: Progressing toward goal Goals/treatment plan/discharge plan were made with and agreed upon by patient/family: Yes Time For Goal Achievement: 7 days Wound Therapy - Potential for Goals: Good  Goals will be updated until maximal potential achieved or discharge criteria met.  Discharge criteria: when goals achieved,  discharge from hospital, MD decision/surgical intervention, no progress towards goals, refusal/missing three consecutive treatments without  notification or medical reason.  GP     [name] [name] 09/13/2017, 10:35 AM   [name] [name], PT, DPT Acute Rehabilitation Services Pager: [phone]

## 2017-09-13 NOTE — Progress Notes (Signed)
LVAD Coordinator Rounding Note  Admitted 08/11/17 for initiation of Milrinone. 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 He was evaluated [facility] /Dr Posey Pronto on 5/9 for possible heart transplant.   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 Prescott Gum under Destination Therapy criteria. Dr [name] discussed heart transplant candidacy with Dr Posey Pronto. Given size and blood type there was concern he would not make it to transplant.   Pt in bed, states he just finished working with PT.  Vital signs: Temp:  98.8 HR: 70 afib Auto BP:  97/74(83) Doppler:  90 O2 Sat:  100 % on RA Wt: 238.....260>239>227>225>229>229>227>222>228>225>222>221>216>215>220 lbs   LVAD interrogation reveals:  Speed: 5300 Flow: 3.9 Power: 3.7w PI:  5.0 Alarms: none  Events: none Hematocrit:  31 Fixed speed: 5300 Low speed limit: 5000  Drive Line:  Left abd dressing dry and intact; anchors intact and accurately applied. Existing VAD dressing removed and site care performed using sterile technique. Drive line exit site cleaned with Chlora prep applicators x 2, allowed to dry, and gauze dressing with silver strip re-applied. Exit site healing and unincorporated, the velour is exposed about 1/4" at exit site. This is new from my last dressing change. Pt states that his controller has not been pulled on or dropped. This may be a result of fluid/weight loss. Proximal stitch intact. Small amount of serous drainage. No redness, tenderness, foul odor or rash noted. Drive line anchor secure x 2. Please do not use the skin prep. Continue daily dressing changes.         Labs:  LDH trend: 274......324>384>425>423>397>267>306>239>245>236>223>205>216>205>197>177  INR trend:  1.71.......2.67>2.61>2.65>2.58>2.06>1.89>1.96>1.97>1.92>2.31  Anticoagulation Plan: -INR Goal: 2.0 - 2.5 -ASA Dose: 81 mg daily   Blood Products:  Intra op: - 08/16/17> one platelet; 2 FFP - 08/17/17> ome FFP  Post op: -  08/16/17 > one unit PCs - 08/21/17 > one unit PCs - 08/24/17 > one unit PCs  Device:  - BiV Medtronic - Therapies: off  Respiratory: extubated 08/27/17  Nitric Oxide: off 08/21/17  Gtts: - Epi 1 mcg/min - stopped 08/31/17 - Milrinone 0.125 mcg/kg/min - off 09/08/17  Adverse Events on VAD: - 08/17/17>mediastinal reexploration with evacuation of mediastinal clot - 08/19/17>sternal closure  VAD education:  1. Caregiver is changing drive line dressing independently.  2.VAD discharge education with patient and family completed on 09/09/17.  Plan/Recommendations:  1. Daily dressing changes per VAD coordinator, nurse champion, or trained caregiver. 2. Page VAD Coordinator with any VAD equipment or drive line issues.    Tanda Rockers RN, VAD Coordinator 24/7 VAD Pager: [phone]

## 2017-09-13 NOTE — Progress Notes (Signed)
Occupational Therapy Session Note  Patient Details  Name: [name] [name] MRN: [id number] Date of Birth: 19-Jul-1950  Today's Date: 09/13/2017 OT Individual Time: 9150-5697 OT Individual Time Calculation (min): 57 min    Short Term Goals: Week 1:  OT Short Term Goal 1 (Week 1): Pt will complete toilet transfer with Min A and LRAD OT Short Term Goal 2 (Week 1): Pt will complete bathing with Min A and sit<stand level OT Short Term Goal 3 (Week 1): Pt will don shirt with supervision   Skilled Therapeutic Interventions/Progress Updates:    Pt completed bathing and dressing sit to stand on the EOB.  Increased time needed to complete secondary to increased left buttocks pain during transitions as well as decreased overall endurance.  Total assist for donning gripper socks, but may benefit from sockaide to assist with this.  Min assist for sit to stand from the EOB with mod demonstrational cueing for adhering to sternal precautions.  He completed stand pivot transfer to the bedside recliner with min assist using the RW for support.  Place Roho cushion in chair to help increase sitting tolerance.  Pt donned hospital gown per request and did not attempt any other clothing this session.  Call button in reach.    Therapy Documentation Precautions:  Precautions Precautions: Sternal, Fall Precaution Comments: LVAD, sacral wound Restrictions Weight Bearing Restrictions: No  Pain: Pain Assessment Pain Scale: Faces Pain Score: 3  Faces Pain Scale: Hurts a little bit Pain Type: Acute pain Pain Location: Buttocks Pain Orientation: Right;Left Pain Descriptors / Indicators: Sore Pain Onset: On-going Patients Stated Pain Goal: 2 Pain Intervention(s): Repositioned Multiple Pain Sites: No ADL: See Function Navigator for Current Functional Status.   Therapy/Group: Individual Therapy  [name] [name] OTR/L 09/13/2017, 12:44 PM

## 2017-09-13 NOTE — Progress Notes (Signed)
[facility] PHYSICAL MEDICINE & REHABILITATION     PROGRESS NOTE    Subjective/Complaints: Received tramadol yesterday which sedated him also. Feeling better this morning  ROS: Patient denies fever, rash, sore throat, blurred vision, nausea, vomiting, diarrhea, cough, shortness of breath or chest pain, joint or back pain, headache, or mood change.   Objective:  No results found. Recent Labs    09/12/17 0339 09/13/17 0331  WBC 6.2 6.6  HGB 9.4* 9.5*  HCT 31.1* 31.3*  PLT 252 232   Recent Labs    09/12/17 0339 09/13/17 0331  NA 137 136  K 3.8 3.6  CL 102 99  GLUCOSE 94 95  BUN 18 20  CREATININE 1.23 1.20  CALCIUM 8.9 8.7*   CBG (last 3)  Recent Labs    09/12/17 2155 09/13/17 0632 09/13/17 0702  GLUCAP 84 75 70    Wt Readings from Last 3 Encounters:  09/13/17 100.2 kg (220 lb 14.4 oz)  09/10/17 97.8 kg (215 lb 8 oz)  07/22/17 103.9 kg (229 lb)     Intake/Output Summary (Last 24 hours) at 09/13/2017 0841 Last data filed at 09/13/2017 0045 Gross per 24 hour  Intake 240 ml  Output 1500 ml  Net -1260 ml    Vital Signs: Blood pressure (!) 84/65, pulse 63, temperature 98.8 F (37.1 C), resp. rate 18, height 5\' 11"  (1.803 m), weight 100.2 kg (220 lb 14.4 oz), SpO2 98 %. Physical Exam:  Constitutional: No distress . Vital signs reviewed. HEENT: EOMI, oral membranes moist, dysphonic Neck: supple Cardiovascular: RRR without murmur. No JVD    Respiratory: CTA Bilaterally without wheezes or rales. Normal effort    GI: BS +, non-tender, non-distended  Musculoskeletal:  1+ LE edema Neurological: He is oriented to person, place, and time. No cranial nerve deficit.   alert. UE 4- to 4/5 prox to distal. LE: 2+/5 HF, 3/5 KE and 4/5 ADF/PF. No sensory deficits. Fair insight and awareness. Slow to process  Skin: Skin is warm. Sacral wound dressed   Dry dressing remains on abdomen.  Psychiatric: pleasant and cooperative     Assessment/Plan: 1. Functional deficits  secondary to debility after CHF/LVAD/VDRF which require 3+ hours per day of interdisciplinary therapy in a comprehensive inpatient rehab setting. Physiatrist is providing close team supervision and 24 hour management of active medical problems listed below. Physiatrist and rehab team continue to assess barriers to discharge/monitor patient progress toward functional and medical goals.  Function:  Bathing Bathing position   Position: Sitting EOB  Bathing parts Body parts bathed by patient: Right arm, Left arm, Chest, Abdomen, Front perineal area, Right upper leg, Left upper leg Body parts bathed by helper: Buttocks, Right lower leg, Left lower leg, Back  Bathing assist        Upper Body Dressing/Undressing Upper body dressing   What is the patient wearing?: Pull over shirt/dress     Pull over shirt/dress - Perfomed by patient: Thread/unthread right sleeve, Thread/unthread left sleeve, Put head through opening, Pull shirt over trunk          Upper body assist Assist Level: Supervision or verbal cues      Lower Body Dressing/Undressing Lower body dressing   What is the patient wearing?: Pants, Non-skid slipper socks     Pants- Performed by patient: Thread/unthread right pants leg, Pull pants up/down Pants- Performed by helper: Thread/unthread left pants leg   Non-skid slipper socks- Performed by helper: Don/doff right sock, Don/doff left sock  Lower body assist        Toileting Toileting Toileting activity did not occur: No continent bowel/bladder event Toileting steps completed by patient: Adjust clothing prior to toileting, Performs perineal hygiene, Adjust clothing after toileting      Toileting assist     Transfers Chair/bed transfer Chair/bed transfer activity did not occur: Refused           Locomotion Ambulation Ambulation activity did not occur: Scientist, research (physical sciences) activity did not occur: Refused         Cognition Comprehension Comprehension assist level: Understands basic less than 25% of the time/ requires cueing >75% of the time  Expression Expression assist level: Expresses basis less than 25% of the time/requires cueing >75% of the time.  Social Interaction Social Interaction assist level: Interacts appropriately less than 25% of the time. May be withdrawn or combative.  Problem Solving Problem solving assist level: Solves basic less than 25% of the time - needs direction nearly all the time or does not effectively solve problems and may need a restraint for safety  Memory Memory assist level: (somnolent>< sleeping)   Medical Problem List and Plan:  1. Functional and mobility deficits secondary to debility after congestive heart failure/LVAD/VDRF and multiple other medical issues  -beginning therapies  2. DVT Prophylaxis/Anticoagulation: Pharmaceutical: Coumadin INR therapeutic 3. Pain Management: tylenol prn.   -Tramadol for hydrotherapy only 4. Mood: LCSW to follow for evaluation and support.  5. Neuropsych: This patient is capable of making decisions on his own behalf.  6. Deep tissue injury bilateral buttocks/Skin/Wound Care: Air mattress overlay. Boost when in chair. Hydrotherapy to buttocks M->Sa.   -tramadol and oxycodone both sedating  -states he's had T#3 before without issue---will use T3 with hydrotherapy  -otherwise tylenol 7. Fluids/Electrolytes/Nutrition: Strict I/O. Continue nutritional supplement to promote wound healing due due to low albumin levels.   -I personally reviewed all of the patient's labs today, and lab work is within normal limits. 8. T2DM: Monitor BS ac/hs. Continue Lantus  9. A fib: Off amiodarone.   10. 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 Started on [facility] 7/12. Continue Spironolactone and digoxin daily. Monitor renal status serially.  11. [name]/VF: S/p emergent cardioversion 6/24 and 6/26-->ICD turned on 7/12. To keep K> 4.0 and Mg>  2.0  12. ABLA: Improved with transfusion.  Hemoglobin 9.4 on 7/14 13. Dysphagia: Continue dysphagia 2, honey liquids with chin tuck. Hypernatremia has resolved.  14. LVAD implant 6/17: On coumadin and low dose ASA. Education ongoing with patient and wife. Dressing changes per VAD coordinator or trained caregiver.   LOS (Days) [place] EVALUATION WAS PERFORMED  Meredith Staggers, MD 09/13/2017 8:41 AM

## 2017-09-13 NOTE — Progress Notes (Addendum)
Patient ID: [name] [name], male   DOB: [birth year], 67 y.o.   MRN: [id number]     Advanced Heart Failure Rounding Note  PCP-Cardiologist: No primary care provider on file.   Subjective:    Events 6/17 Underwent HM-3 implant -> Unable to close chest with RV failure and high intrathoracic pressure 6/18 Taken back to OR for evacuation of hematoma. NO RVAD needed. Left sternum open  08/19/17 Taken back to OR for sternal closure 6/22 VT => amiodarone started.  6/24 VT/VF=>urgent cardioversion. 6/25 VT  =>urgent cardioversion  6/27 Extubated 7/12 to CIR  Making slow progress. Meds adjusted this morning as he was felt to be overly sedated. Continues to have pain from decubitius ulcer. MAPs stable.   LVAD Interrogation HM 3: Speed: 5300 Flow: 3.8 PI: 3.4 Power: 4.0. - VAD interrogated personally. Parameters stable.    Objective:   Weight Range: 220 lb 14.4 oz (100.2 kg) Body mass index is 30.81 kg/m.   Vital Signs:   Temp:  [98.8 F (37.1 C)] 98.8 F (37.1 C) (07/15 0435) Pulse Rate:  [44-63] 63 (07/15 0435) Resp:  [18-20] 18 (07/15 0435) BP: (84-99)/(65-77) 84/65 (07/15 0435) SpO2:  [93 %-98 %] 98 % (07/15 0435) Weight:  [220 lb 14.4 oz (100.2 kg)] 220 lb 14.4 oz (100.2 kg) (07/15 0500) Last BM Date: 09/12/17   MAP 80s  Weight change: Filed Weights   09/11/17 0410 09/12/17 0300 09/13/17 0500  Weight: 219 lb (99.3 kg) 220 lb (99.8 kg) 220 lb 14.4 oz (100.2 kg)   Intake/Output:   Intake/Output Summary (Last 24 hours) at 09/13/2017 0824 Last data filed at 09/13/2017 0045 Gross per 24 hour  Intake 240 ml  Output 1500 ml  Net -1260 ml   Physical Exam    Physical Exam: General: NAD.  HEENT: Normal. Neck: Supple, JVP 7-8 cm. Carotids OK.  Cardiac:  Mechanical heart sounds with LVAD hum present.  Lungs:  CTAB, normal effort.  Abdomen:  NT, ND, no HSM. No bruits or masses. +BS  LVAD exit site: Dressing dry and intact. No erythema or drainage. Stabilization device  present and accurately applied. Driveline dressing changed daily per sterile technique. Extremities:  Warm and dry. No cyanosis, clubbing, rash, or edema.  Neuro:  Alert & oriented x 3. Cranial nerves grossly intact. Moves all 4 extremities w/o difficulty. Affect pleasant     Telemetry   N/A  Labs    CBC Recent Labs    09/11/17 0306 09/12/17 0339 09/13/17 0331  WBC 5.7 6.2 6.6  NEUTROABS 4.1  --   --   HGB 9.2* 9.4* 9.5*  HCT 31.0* 31.1* 31.3*  MCV 84.0 82.9 83.2  PLT 294 252 979   Basic Metabolic Panel Recent Labs    09/12/17 0339 09/13/17 0331  NA 137 136  K 3.8 3.6  CL 102 99  CO2 28 27  GLUCOSE 94 95  BUN 18 20  CREATININE 1.23 1.20  CALCIUM 8.9 8.7*  MG 2.1 1.9   Liver Function Tests Recent Labs    09/12/17 0339 09/13/17 0331  AST 27 25  ALT 21 22  ALKPHOS 85 83  BILITOT 1.1 1.1  PROT 7.6 7.8  ALBUMIN 2.4* 2.4*   No results for input(s): LIPASE, AMYLASE in the last 72 hours. Cardiac Enzymes No results for input(s): CKTOTAL, CKMB, CKMBINDEX, TROPONINI in the last 72 hours.  BNP: BNP (last 3 results) Recent Labs    08/30/17 0000 09/05/17 2327 09/13/17 0331  BNP 114.6* 86.1  53.3    ProBNP (last 3 results) Recent Labs    11/12/16 1559  PROBNP 388.0*     D-Dimer No results for input(s): DDIMER in the last 72 hours. Hemoglobin A1C No results for input(s): HGBA1C in the last 72 hours. Fasting Lipid Panel No results for input(s): CHOL, HDL, LDLCALC, TRIG, CHOLHDL, LDLDIRECT in the last 72 hours. Thyroid Function Tests No results for input(s): TSH, T4TOTAL, T3FREE, THYROIDAB in the last 72 hours.  Invalid input(s): FREET3  Other results:   Imaging    No results found.   Medications:     Scheduled Medications: . aspirin  81 mg Oral Daily  . bisacodyl  10 mg Oral Daily   Or  . bisacodyl  10 mg Rectal Daily  . Chlorhexidine Gluconate Cloth  6 each Topical QHS  . collagenase   Topical Daily  . famotidine  20 mg Oral BID  .  insulin aspart  0-15 Units Subcutaneous TID WC  . insulin aspart  0-5 Units Subcutaneous QHS  . insulin detemir  25 Units Subcutaneous QHS  . magnesium oxide  400 mg Oral BID  . mouth rinse  15 mL Mouth Rinse BID  . multivitamin with minerals  1 tablet Oral Daily  . nystatin  5 mL Oral QID  . potassium chloride  40 mEq Oral Daily  . protein supplement shake  11 oz Oral BID BM  . sodium chloride flush  10-40 mL Intracatheter Q12H  . spironolactone  25 mg Oral Daily  . torsemide  60 mg Oral Daily  . vitamin C  500 mg Oral BID  . Warfarin - Pharmacist Dosing Inpatient   Does not apply q1800    Infusions: . lactated ringers      PRN Medications: acetaminophen, acetaminophen-codeine, alum & mag hydroxide-simeth, bisacodyl, clonazepam, diphenhydrAMINE, guaiFENesin-dextromethorphan, lactated ringers, levalbuterol, polyethylene glycol, prochlorperazine **OR** prochlorperazine **OR** prochlorperazine, RESOURCE THICKENUP CLEAR, sodium chloride flush, sodium phosphate, traZODone    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   Directly admitted with persistent low cardiac output for milrinone initiation for home.  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 Acute/Chronic systolic CHF with biventricular failure-> cardiogenic shock: Echo  08/13/2017 EF 20-25%. s/p  Medtronic BiV ICD in place. Cath 12/18 with stable 1v CAD.  s/p HM-3 implant 6/17. Unable to close chest due to high-intrathoracic pressures and RV failure. Taken back to OR 08/17/17 with evacuation of hematoma with improvement. Extubated 6/28.  Ramp ECHO 7/11 with speed increased to 5300.  - Volume status stable.  - Continue torsemide 60 mg daily  - Continue spiro 25 mg daily.  2. 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 LDH 180. Ramp ECHO speed increased to 5300.   - -  VAD interrogated personally. Parameters stable.   - Continue warfarin + ASA 81, INR 2.3.  3. Acute on CKD II-III:  - Resolved.   4. [name]/VF: Recurrent VT on 6/22.  S/P VF 6/24 emergent bedside cardioversion. S/P VT 6/26 emergent bedside cardioversion. ICD turned back on.   -- No change to current plan.   5. AFL/atrial fibrillation: S/p previous ablation. He is currently in atrial fibrillation.  - Rate controlled. Amio stopped.  - on coumadin. No bleeding.  6. CAD s/p PCI of RCA and  LAD: Recent cath with stable CAD as above: No chest pain.  7. [name]: Recent A1c 8.3 on 6/3.  - Continue current regimen.  8. Anemia: Post-op.  Got 1 unit PRBCs 6/22 and 6/25. - Stable. Hgb 9.5  9. Deconditioning: Severe, working with PT aggressively.   10. Unstageable Pressure Ulcer: R and L buttock.  R buttock with necrosis noted. Continue santyl for selective debridement. Change daily. Continue air overlay. Needs to reposition every hour when in the chair. Keep R /L in bed. . May need a different chair cushion. Added hydrotherapy.   - WOC following.   I reviewed the LVAD parameters from today, and compared the results to the patient's prior recorded data.  No programming changes were made.  The LVAD is functioning within specified parameters.  The patient performs LVAD self-test daily.  LVAD interrogation was negative for any significant power changes, alarms or PI events/speed drops.  LVAD equipment check completed and is in good working order.  Back-up equipment present.   LVAD education done on emergency procedures and precautions and reviewed exit site care.   Length of Stay: 3  Annamaria Helling  09/13/2017, 8:24 AM  Advanced Heart Failure Team Pager [phone] (M-F; 7a - 4p)  Please contact [facility] for night-coverage after hours (4p -7a ) and weekends on [email or web address]  Agree with the above note.  MAP stable.  INR therapeutic.  Hgb stable.  LVAD parameters reviewed and stable.    Loralie Champagne 09/13/2017

## 2017-09-13 NOTE — Progress Notes (Addendum)
Physical Therapy Session Note  Patient Details  Name: [name] [name] MRN: [id number] Date of Birth: 04/04/1950  Today's Date: 09/13/2017 PT Individual Time: 1000-1039 and 1250-1315 PT Individual Time Calculation (min): 39 min and 25 min  Short Term Goals: Week 1:  PT Short Term Goal 1 (Week 1): Pt will ambulate 25' w/ min assist PT Short Term Goal 2 (Week 1): Pt will maintain sternal precautions during functional mobility w/ min cues 50% of the time PT Short Term Goal 3 (Week 1): Pt will perform bed<>chair transfer w/ min assist PT Short Term Goal 4 (Week 1): Pt will demonstrate safe transition of LVAD between battery and wall power w/o cues 50% of the time  Skilled Therapeutic Interventions/Progress Updates:    Pt in bed on side after hydrotherapy.  PT encouraged pt and educated pt on importance of out of bed mobility, pt continues to refuse out of bed due to pain from hydrotherapy.  PT educated pt and pt performed switching LVAD from wall unit to batteries and then back to wall power with PT giving cues and pt performing.  PT provided pt with a ROHO and a gel cushion and explained to pt importance of attempting to sit out of bed, pt states he will perform at next session.   Session 2: pt continues with c/o pain in buttocks but did tolerate sitting in chair > 60 mins.  Pt performs sit <> stand and standing tolerance 3 x 1 minute. Pt stands with supervision with RW.  Gait with RW x 20' with supervision.  Pt left in bed on side with pillows for positioning, needs at hand. Pt and RN aware of pt's goal to sit up for 1 hour at dinner time.  Therapy Documentation Precautions:  Precautions Precautions: Sternal, Fall Precaution Comments: LVAD, sacral wound Restrictions Weight Bearing Restrictions: No General: PT Amount of Missed Time (min): 21 Minutes PT Missed Treatment Reason: Pain;Patient unwilling to participate Pain:  pt c/o pain in buttocks due to just completing hydrotherapy.  Pt  lying on side and medicated prior to procedure but still having pain.   Therapy/Group: Individual Therapy  DONAWERTH,KAREN 09/13/2017, 10:50 AM

## 2017-09-13 NOTE — Progress Notes (Signed)
[place] for coumadin Indication: LVAD  Allergies  Allergen Reactions  . Penicillins Hives, Itching and Other (See Comments)    Has patient had a PCN reaction causing immediate rash, facial/tongue/throat swelling, SOB or lightheadedness with hypotension:# # Yes # # Has patient had a PCN reaction causing severe rash involving mucus membranes or skin necrosis: No Has patient had a PCN reaction that required hospitalization: No Has patient had a PCN reaction occurring within the last 10 years: No If all of the above answers are "NO", then may proceed with Cephalosporin use.     Patient Measurements: Height: 5\' 11"  (711.6 cm) Weight: 220 lb 14.4 oz (100.2 kg) IBW/kg (Calculated) : 75.3 Heparin Dosing Weight: 98.3 kg  Vital Signs: Temp: 98.8 F (37.1 C) (07/15 0435) BP: 84/65 (07/15 0435) Pulse Rate: 63 (07/15 0435)  Labs: Recent Labs    09/11/17 0306 09/12/17 0339 09/13/17 0331  HGB 9.2* 9.4* 9.5*  HCT 31.0* 31.1* 31.3*  PLT 294 252 232  LABPROT 23.0* 25.2* 25.2*  INR 2.05 2.31 2.31  CREATININE 1.28* 1.23 1.20    Estimated Creatinine Clearance: 72.1 mL/min (by C-G formula based on SCr of 1.2 mg/dL).   Medical History: Past Medical History:  Diagnosis Date  . AICD (automatic cardioverter/defibrillator) present 02/05/2014   Upgrade to Medtronic biventricular ICD, serial number  [id number]   . Atrial flutter ([place]) 04/2012   s/p TEE-EPS+RFCA 04/2012  . CAD (coronary artery disease) 5790,3833 X 2    RCA-T, 70% PL (off CFX), 99% Prox LAD/90% Dist LAD, S/P TAXUS stent x 2  . CHF (congestive heart failure) ([place])   . Chronic anticoagulation   . Chronic systolic heart failure ([place])   . CKD (chronic kidney disease)   . Diabetic retinopathy ([place])   . DM type 2 (diabetes mellitus, type 2) (HCC)    insulin dependent  . HTN (hypertension)   . Hypercholesteremia    ablation  . ICD (implantable cardiac defibrillator) in place   .  Ischemic cardiomyopathy March 2015   20-25% 2D   . Nephrolithiasis   . Ventricular tachycardia [facility])     Assessment: 79 yy.o M who underwent LVAD placement on 6/17 currently on warfarin per Rx.  Today, 09/13/2017: - INR is therapeutic at 2.31 - cbc stable - no bleeding documented - no significant drug-drug intxns  Goal of Therapy:  INR goal 2-2.5 Monitor platelets by anticoagulation protocol: Yes   Plan:   - Warfarin 3 mg tonight. - Monitor s/s bleeding - Will provide warfarin education prior to discharge  Assunta Curtis, [place] Clinical Pharmacist See Shea Evans for phone #

## 2017-09-13 NOTE — Progress Notes (Signed)
Patient information reviewed and entered into eRehab system by Versie Soave, RN, CRRN, PPS Coordinator.  Information including medical coding and functional independence measure will be reviewed and updated through discharge.     Per nursing patient was given "Data Collection Information Summary for Patients in Inpatient Rehabilitation Facilities with attached "Privacy Act Statement-Health Care Records" upon admission.  

## 2017-09-13 NOTE — IPOC Note (Signed)
Overall Plan of Care [facility]) Patient Details Name: [name] [name] MRN: [id number] DOB: [birth year]  Admitting Diagnosis: <principal problem not specified>debility  Hospital Problems: Active Problems:   Chronic systolic CHF (congestive heart failure) (HCC)   Presence of left ventricular assist device (LVAD) ([place])   Debility     Functional Problem List: Nursing Endurance, Nutrition, Pain, Safety, Skin Integrity, Other (comment)  PT Balance, Behavior, Edema, Endurance, Motor, Safety, Skin Integrity  OT Balance, Cognition, Safety, Endurance, Skin Integrity, Motor  SLP Cognition, Linguistic, Nutrition  TR         Basic ADL's: OT Grooming, Bathing, Dressing, Toileting     Advanced  ADL's: OT Simple Meal Preparation     Transfers: PT Bed Mobility, Bed to Chair, Car, Furniture, Floor  OT Tub/Shower, Agricultural engineer: PT Stairs, Emergency planning/management officer, Ambulation     Additional Impairments: OT None  SLP Swallowing, Communication, Social Cognition expression Memory, Problem Solving  TR      Anticipated Outcomes Item Anticipated Outcome  Self Feeding No goal  Swallowing  Supervision    Basic self-care  Supervision-Min A  Toileting  Supervision   Bathroom Transfers Supervision   Bowel/Bladder  Continent to bowel and bladder with mod. assisst.  Transfers  Supervision  Locomotion  Supervision gait household distances  Communication  Supervision  Cognition  Supervision   Pain  Less than 3,on 1 to 10 scale.  Safety/Judgment  Free from falls during his stay in rehab.   Therapy Plan: PT Intensity: Minimum of 1-2 x/day ,45 to 90 minutes PT Frequency: 5 out of 7 days PT Duration Estimated Length of Stay: 2 weeks OT Intensity: Minimum of 1-2 x/day, 45 to 90 minutes OT Frequency: 5 out of 7 days OT Duration/Estimated Length of Stay: 14-16 days SLP Intensity: Minumum of 1-2 x/day, 30 to 90 minutes SLP Frequency: 3 to 5 out of 7 days SLP Duration/Estimated  Length of Stay: 14-21 days     Team Interventions: Nursing Interventions Patient/Family Education, Pain Management, Skin Care/Wound Management, Dysphagia/Aspiration Precaution Training, Disease Management/Prevention, Medication Management, Discharge Planning  PT interventions Visual/perceptual remediation/compensation, Stair training, Pain management, Disease management/prevention, Ambulation/gait training, Training and development officer, DME/adaptive equipment instruction, Patient/family education, Therapeutic Activities, Wheelchair propulsion/positioning, Psychosocial support, Therapeutic Exercise, Cognitive remediation/compensation, Community reintegration, Functional mobility training, Skin care/wound management, UE/LE Strength taining/ROM, UE/LE Coordination activities, Splinting/orthotics, Discharge planning, Neuromuscular re-education  OT Interventions Balance/vestibular training, Discharge planning, Functional electrical stimulation, Pain management, Self Care/advanced ADL retraining, Therapeutic Activities, UE/LE Coordination activities, Visual/perceptual remediation/compensation, Therapeutic Exercise, Skin care/wound managment, Patient/family education, Functional mobility training, Disease mangement/prevention, Cognitive remediation/compensation, Academic librarian, Engineer, drilling, Neuromuscular re-education, Splinting/orthotics, Psychosocial support, UE/LE Strength taining/ROM, Wheelchair propulsion/positioning  SLP Interventions Cognitive remediation/compensation, English as a second language teacher, Environmental controls, Functional tasks, Dysphagia/aspiration precaution training, Patient/family education, Internal/external aids, Speech/Language facilitation  TR Interventions    SW/CM Interventions Discharge Planning, Psychosocial Support, Patient/Family Education   Barriers to Discharge MD  Medical stability  Nursing      PT Medical stability, Wound Care s/p LVAD, sacral wound  OT  Wound Care, Medical stability    SLP Other (comments) new LVAD  SW       Team Discharge Planning: Destination: PT-Home ,OT- Home , SLP-Home Projected Follow-up: PT-Home health PT, OT-  Home health OT, SLP-24 hour supervision/assistance, Home Health SLP, Outpatient SLP Projected Equipment Needs: PT-To be determined, OT- To be determined, SLP-To be determined Equipment Details: PT- , OT-  Patient/family involved in discharge planning: PT- Patient, Family member/caregiver,  OT-Patient, SLP-Patient, Family  member/caregiver  MD ELOS: 2 weeks Medical Rehab Prognosis:  Excellent Assessment: The patient has been admitted for CIR therapies with the diagnosis of debility. The team will be addressing functional mobility, strength, stamina, balance, safety, adaptive techniques and equipment, self-care, bowel and bladder mgt, patient and caregiver education, LVAD education, stamina, wound care, pain mgt, swallowing and speech. Goals have been set at supervision for mobility and self-care, mod I to supervision for swallowing and communication. Meredith Staggers, MD, FAAPMR      See Team Conference Notes for weekly updates to the plan of care

## 2017-09-13 NOTE — Progress Notes (Signed)
Social Work Social Work Assessment and Plan  Patient Details  Name: [name] [name] MRN: [id number] Date of Birth: [birth year]  Today's Date: 09/13/2017  Problem List:  Patient Active Problem List   Diagnosis Date Noted  . Debility 09/10/2017  . CHF (congestive heart failure) ([place])   . Type 2 diabetes mellitus with retinopathy, with long-term current use of insulin ([place])   . Stage 3 chronic kidney disease ([place])   . Cardiogenic shock ([place])   . Acute respiratory failure ([place])   . Tachypnea   . Leukocytosis   . Acute blood loss anemia   . Pressure injury of skin 08/27/2017  . Acute respiratory failure with hypoxemia ([place])   . Acute pulmonary edema (HCC)   . Metabolic alkalosis   . Presence of left ventricular assist device (LVAD) ([place])   . S/P TVR (tricuspid valve repair) 08/17/2017  . Goals of care, counseling/discussion   . Palliative care encounter   . Acute on chronic systolic (congestive) heart failure ([place]) 08/11/2017  . Acute on chronic combined systolic and diastolic CHF (congestive heart failure) ([place]) 11/19/2016  . Acute on chronic left and right systolic w/ diastolic heart failure, NYHA class 2 ([place]) 11/18/2016  . Hx of atrial flutter 11/18/2016  . CAD (coronary artery disease)-RCA-T, 70% PL (off CFX), 99% Prox LAD/90% Dist LAD, S/P TAXUS stent x 2 11/18/2016  . HTN (hypertension) 11/18/2016  . Diabetes mellitus with complication ([place]) 40/98/1191  . Thrombocytopenia ([place]) 11/18/2016  . Bilateral lower extremity edema 11/12/2016  . Diabetic polyneuropathy associated with diabetes mellitus due to underlying condition ([place]) 05/29/2015  . Chronic systolic CHF (congestive heart failure) ([place]) 02/05/2014  . Medtronic biventricular ICD, serial number  BLD [id number]  02/05/2014  . CAD (coronary artery disease), native coronary artery 01/08/2014  . Chronic renal disease, stage III ([place]) 01/08/2014  . Heart failure ([place]) 12/16/2013  . Mitral regurgitation 04/17/2013  . Chronic  systolic dysfunction of left ventricle 06/29/2012  . Atrial flutter ([place]) 06/03/2012  . Acute kidney injury (nontraumatic) ([place]) 05/31/2012  . Chronic anticoagulation   . Acute on chronic systolic heart failure ([place]) 05/21/2012  . Nausea with vomiting 05/18/2012  . PAF (paroxysmal atrial fibrillation) ([place]) 05/17/2012  . Orthopnea 05/17/2012  . TIA (transient ischemic attack) 05/29/2010  . CHEST PAIN 11/13/2008  . VENTRICULAR TACHYCARDIA 08/14/2008  . Automatic implantable cardioverter-defibrillator in situ 08/14/2008  . Type 2 diabetes with nephropathy ([place]) 08/11/2008  . DYSLIPIDEMIA 08/11/2008  . Cardiomyopathy, ischemic 08/11/2008  . Disorder resulting from impaired renal function 08/11/2008  . HYPERCHOLESTEROLEMIA 04/29/2006  . HYPERTENSION, BENIGN SYSTEMIC 04/29/2006  . MYOCARDIAL INFARCTION, OLD 04/29/2006  . Coronary atherosclerosis 04/29/2006  . NEPHROLITHIASIS 04/29/2006   Past Medical History:  Past Medical History:  Diagnosis Date  . AICD (automatic cardioverter/defibrillator) present 02/05/2014   Upgrade to Medtronic biventricular ICD, serial number  [id number]   . Atrial flutter ([place]) 04/2012   s/p TEE-EPS+RFCA 04/2012  . CAD (coronary artery disease) 4782,9562 X 2    RCA-T, 70% PL (off CFX), 99% Prox LAD/90% Dist LAD, S/P TAXUS stent x 2  . CHF (congestive heart failure) ([place])   . Chronic anticoagulation   . Chronic systolic heart failure ([place])   . CKD (chronic kidney disease)   . Diabetic retinopathy ([place])   . DM type 2 (diabetes mellitus, type 2) (HCC)    insulin dependent  . HTN (hypertension)   . Hypercholesteremia    ablation  . ICD (implantable cardiac defibrillator) in place   .  Ischemic cardiomyopathy March 2015   20-25% 2D   . Nephrolithiasis   . Ventricular tachycardia [facility])    Past Surgical History:  Past Surgical History:  Procedure Laterality Date  . ATRIAL FLUTTER ABLATION N/A 05/19/2012   Procedure: ATRIAL FLUTTER ABLATION;  Surgeon:  Thompson Grayer, MD;  Location: [facility] CATH LAB;  Service: Cardiovascular;  Laterality: N/A;  . BI-VENTRICULAR IMPLANTABLE CARDIOVERTER DEFIBRILLATOR UPGRADE N/A 02/05/2014   Procedure: BI-VENTRICULAR IMPLANTABLE CARDIOVERTER DEFIBRILLATOR UPGRADE;  Surgeon: Evans Lance, MD;  Location: [facility] CATH LAB;  Service: Cardiovascular;  Laterality: N/A;  . BIV ICD GENERTAOR CHANGE OUT  02/05/2014   Upgrade to Medtronic biventricular ICD, serial number  [id number] by Dr. [name]  . CARDIAC DEFIBRILLATOR PLACEMENT  2007    Medtronic Maximo VR, serial number [id number]  . INSERTION OF IMPLANTABLE LEFT VENTRICULAR ASSIST DEVICE N/A 08/16/2017   Procedure: INSERTION OF IMPLANTABLE LEFT VENTRICULAR ASSIST DEVICE-HM3;  Surgeon: Ivin Poot, MD;  Location: [place];  Service: Open Heart Surgery;  Laterality: N/A;  . IR FLUORO GUIDE CV LINE RIGHT  08/12/2017  . IR US GUIDE VASC ACCESS RIGHT  08/12/2017  . MEDIASTINAL EXPLORATION  08/17/2017   Procedure: MEDIASTINAL REXPLORATION with evacuation of hematoma;  Surgeon: Prescott Gum, Collier Salina, MD;  Location: [facility] OR;  Service: Open Heart Surgery;;  . PERCUTANEOUS CORONARY STENT INTERVENTION (PCI-S)  January 2002   PTCA/Stent Distal RCA  . PERCUTANEOUS CORONARY STENT INTERVENTION (PCI-S)  June 2002   PTCA/Stent x 3 RCA, thrombolysis - failed  . PERCUTANEOUS CORONARY STENT INTERVENTION (PCI-S)  July 2006   TAXUS stents to prox and distal LAD  . RIGHT HEART CATH N/A 07/22/2017   Procedure: RIGHT HEART CATH;  Surgeon: Jolaine Artist, MD;  Location: [place] CV LAB;  Service: Cardiovascular;  Laterality: N/A;  . RIGHT HEART CATH N/A 08/13/2017   Procedure: RIGHT HEART CATH - swan;  Surgeon: Jolaine Artist, MD;  Location: [place] CV LAB;  Service: Cardiovascular;  Laterality: N/A;  . RIGHT/LEFT HEART CATH AND CORONARY ANGIOGRAPHY N/A 02/18/2017   Procedure: RIGHT/LEFT HEART CATH AND CORONARY ANGIOGRAPHY;  Surgeon: Jolaine Artist, MD;  Location: [place] CV LAB;   Service: Cardiovascular;  Laterality: N/A;  . STERNAL CLOSURE N/A 08/19/2017   Procedure: STERNAL CLOSURE;  Surgeon: Ivin Poot, MD;  Location: [place];  Service: Thoracic;  Laterality: N/A;  . TEE WITHOUT CARDIOVERSION N/A 08/16/2017   Procedure: TRANSESOPHAGEAL ECHOCARDIOGRAM (TEE);  Surgeon: Prescott Gum, Collier Salina, MD;  Location: [place];  Service: Open Heart Surgery;  Laterality: N/A;  . TEE WITHOUT CARDIOVERSION N/A 08/19/2017   Procedure: TRANSESOPHAGEAL ECHOCARDIOGRAM (TEE);  Surgeon: Prescott Gum, Collier Salina, MD;  Location: [place];  Service: Thoracic;  Laterality: N/A;  . TRICUSPID VALVE REPLACEMENT N/A 08/16/2017   Procedure: TRICUSPID VALVE REPAIR using Oletta Lamas MC3 Ring size 30;  Surgeon: Ivin Poot, MD;  Location: [place];  Service: Open Heart Surgery;  Laterality: N/A;   Social History:  reports that he quit smoking about 17 years ago. His smoking use included cigarettes. He has a 29.00 pack-year smoking history. He has never used smokeless tobacco. He reports that he does not drink alcohol or use drugs.  Family / Support Systems Marital Status: Married Patient Roles: Spouse, Parent Spouse/Significant Other: wife, Berry Godsey @ (H) [phone] or ([phone] Children: 4 adult children, however, only daughter Ancil Linsey) lives locally. Other Supports: Lazarus Gowda Anticipated Caregiver: wife  Ability/Limitations of Caregiver: wife can provide up to Mod A based on  her perception of what Mod A is from [facility] description  Caregiver Availability: 24/7 Family Dynamics: Pt described family as very supportive. Wife states she will provide any assistance needed.  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    Abuse/Neglect Abuse/Neglect Assessment Can Be Completed: Yes Physical Abuse: Denies Verbal Abuse: Denies Sexual Abuse: Denies Exploitation of patient/patient's resources: Denies Self-Neglect: Denies  Emotional Status Pt's affect, behavior adn adjustment status: Pt lying in bed and able to answer basic questions and complete assessement without much difficulty.  He offers only brief answers and using soft voice.  Pt denies any significant emotional distress, however, will monitor and refer for neuropsychology as indicated. Recent Psychosocial Issues: None Pyschiatric History: None Substance Abuse History: None  Patient / Family Perceptions, Expectations & Goals Pt/Family understanding of illness & functional limitations: Pt and wife with general understanding of his multiple medical/ cardiac issues, LVAD procedure and current functional limitations/ need for CIR to rebuild overall strength. Premorbid pt/family roles/activities: Pt was independent and no limitations. Anticipated changes in roles/activities/participation: Per goals of supervision, wife to be primary caregiver with intermittent assist of other family members. Pt/family expectations/goals: "I hope I can get back to how I was."  [employer]: None Premorbid Home Care/DME Agencies: None Transportation available at discharge: yes Resource referrals recommended: Neuropsychology  Discharge Planning Living Arrangements: Spouse/significant other Support Systems: Spouse/significant other, Children, Other relatives, Friends/neighbors Type of Residence: Private residence Insurance underwriter Resources: ([place] [facility]) Financial Resources: Fish farm manager, Employment Financial Screen Referred: No Living Expenses: Higher education careers adviser Management: Spouse Does the patient have any problems obtaining your medications?: No Home Management: Pt and wife sharing responsibilities. Patient/Family Preliminary  Plans: Pt to d/c home with wife and other family members providing 24/7 supervision Social Work Anticipated Follow Up Needs: HH/OP Expected length of stay: 14-16 days  Clinical Impression Pleasant gentleman here with CHF and s/p LVAD placement.  Now with debility and goals being set for supervision overall. Wife and family very supportive and can provide 24/7 assistance.  Pt denies any significant emotional distress, however, will monitor while here.  SW to follow for support and d/c planning needs.  Klark Vanderhoef 09/13/2017, 3:47 PM

## 2017-09-13 NOTE — Discharge Instructions (Addendum)
Inpatient Rehab Discharge Instructions  [name] [name] Discharge date and time: 09/24/17   Activities/Precautions/ Functional Status: Activity: no lifting, driving, or strenuous exercise until cleared by MD. Continue sternal precautions.  Diet: cardiac diet--soft foods. Nectar thick liquids with chin tuck--you are at high risk for Aspiration pneumonia if you do not maintain them.  Wound Care: Apply stantyl with damp to dry dressing on buttock wound. Change daily and prn if soiled.    Functional status:  ___ No restrictions     ___ Walk up steps independently _X__ 24/7 supervision/assistance   ___ Walk up steps with assistance ___ Intermittent supervision/assistance  ___ Bathe/dress independently ___ Walk with walker     _X__ Bathe/dress with assistance ___ Walk Independently    ___ Shower independently ___ Walk with assistance    ___ Shower with assistance _X__ No alcohol     ___ Return to work/school ________    COMMUNITY REFERRALS UPON DISCHARGE:    Home Health:   PT     OT     ST    RN                  Agency:  [facility] Phone: [phone]   Medical Equipment/Items Ordered:  Wheelchair, cushion, walker, commode and hospital bed with air mattress                                                      Agency/Supplier:  [facility] @ [phone]   Special Instructions: 1. Need to boost every 20 minutes when in chair. Need to side lie when in bed to help your bottom heal.  2. Monitor blood sugars before meals and at bedtime.  3. HHRN to draw protime on 07/30 with results to Dr. [name].    My questions have been answered and I understand these instructions. I will adhere to these goals and the provided educational materials after my discharge from the hospital.  Patient/Caregiver Signature _______________________________ Date __________  Clinician Signature _______________________________________ Date __________  Please bring this form and your medication  list with you to all your follow-up doctor's appointments.     Information on my medicine - Coumadin   (Warfarin)  This medication education was reviewed with me or my healthcare representative as part of my discharge preparation.  The pharmacist that spoke with me during my hospital stay was:  Einar Grad, [facility]  Why was Coumadin prescribed for you? Coumadin was prescribed for you because you have a blood clot or a medical condition that can cause an increased risk of forming blood clots. Blood clots can cause serious health problems by blocking the flow of blood to the heart, lung, or brain. Coumadin can prevent harmful blood clots from forming. As a reminder your indication for Coumadin is:   Blood Clot Prevention After Heart Pump Surgery  What test will check on my response to Coumadin? While on Coumadin (warfarin) you will need to have an INR test regularly to ensure that your dose is keeping you in the desired range. The INR (international normalized ratio) number is calculated from the result of the laboratory test called prothrombin time (PT).  If an INR APPOINTMENT HAS NOT ALREADY BEEN MADE FOR YOU please schedule an appointment to have this lab work done by your health care provider within 7  days. Your INR goal is usually a number between:  2 to 3 or your provider may give you a more narrow range like 2-2.5.  Ask your health care provider during an office visit what your goal INR is.  What  do you need to  know  About  COUMADIN? Take Coumadin (warfarin) exactly as prescribed by your healthcare provider about the same time each day.  DO NOT stop taking without talking to the doctor who prescribed the medication.  Stopping without other blood clot prevention medication to take the place of Coumadin may increase your risk of developing a new clot or stroke.  Get refills before you run out.  What do you do if you miss a dose? If you miss a dose, take it as soon as you remember on the  same day then continue your regularly scheduled regimen the next day.  Do not take two doses of Coumadin at the same time.  Important Safety Information A possible side effect of Coumadin (Warfarin) is an increased risk of bleeding. You should call your healthcare provider right away if you experience any of the following: ? Bleeding from an injury or your nose that does not stop. ? Unusual colored urine (red or dark brown) or unusual colored stools (red or black). ? Unusual bruising for unknown reasons. ? A serious fall or if you hit your head (even if there is no bleeding).  Some foods or medicines interact with Coumadin (warfarin) and might alter your response to warfarin. To help avoid this: ? Eat a balanced diet, maintaining a consistent amount of Vitamin K. ? Notify your provider about major diet changes you plan to make. ? Avoid alcohol or limit your intake to 1 drink for women and 2 drinks for men per day. (1 drink is 5 oz. wine, 12 oz. beer, or 1.5 oz. liquor.)  Make sure that ANY health care provider who prescribes medication for you knows that you are taking Coumadin (warfarin).  Also make sure the healthcare provider who is monitoring your Coumadin knows when you have started a new medication including herbals and non-prescription products.  Coumadin (Warfarin)  Major Drug Interactions  Increased Warfarin Effect Decreased Warfarin Effect  Alcohol (large quantities) Antibiotics (esp. Septra/Bactrim, Flagyl, Cipro) Amiodarone (Cordarone) Aspirin (ASA) Cimetidine (Tagamet) Megestrol (Megace) NSAIDs (ibuprofen, naproxen, etc.) Piroxicam (Feldene) Propafenone (Rythmol SR) Propranolol (Inderal) Isoniazid (INH) Posaconazole (Noxafil) Barbiturates (Phenobarbital) Carbamazepine (Tegretol) Chlordiazepoxide (Librium) Cholestyramine (Questran) Griseofulvin Oral Contraceptives Rifampin Sucralfate (Carafate) Vitamin K   Coumadin (Warfarin) Major Herbal Interactions    Increased Warfarin Effect Decreased Warfarin Effect  Garlic Ginseng Ginkgo biloba Coenzyme Q10 Green tea St. Johns wort    Coumadin (Warfarin) FOOD Interactions  Eat a consistent number of servings per week of foods HIGH in Vitamin K (1 serving =  cup)  Collards (cooked, or boiled & drained) Kale (cooked, or boiled & drained) Mustard greens (cooked, or boiled & drained) Parsley *serving size only =  cup Spinach (cooked, or boiled & drained) Swiss chard (cooked, or boiled & drained) Turnip greens (cooked, or boiled & drained)  Eat a consistent number of servings per week of foods MEDIUM-HIGH in Vitamin K (1 serving = 1 cup)  Asparagus (cooked, or boiled & drained) Broccoli (cooked, boiled & drained, or raw & chopped) Brussel sprouts (cooked, or boiled & drained) *serving size only =  cup Lettuce, raw (green leaf, endive, romaine) Spinach, raw Turnip greens, raw & chopped   These websites have more information on Coumadin (  warfarin):  FailFactory.se; VeganReport.com.au;

## 2017-09-14 ENCOUNTER — Inpatient Hospital Stay (HOSPITAL_COMMUNITY): Payer: PPO | Admitting: Speech Pathology

## 2017-09-14 ENCOUNTER — Inpatient Hospital Stay (HOSPITAL_COMMUNITY): Payer: PPO | Admitting: Occupational Therapy

## 2017-09-14 ENCOUNTER — Inpatient Hospital Stay (HOSPITAL_COMMUNITY): Payer: PPO

## 2017-09-14 ENCOUNTER — Inpatient Hospital Stay (HOSPITAL_COMMUNITY): Payer: PPO | Admitting: Physical Therapy

## 2017-09-14 LAB — CBC
HCT: 30.5 % — ABNORMAL LOW (ref 39.0–52.0)
Hemoglobin: 9.6 g/dL — ABNORMAL LOW (ref 13.0–17.0)
MCH: 25.8 pg — AB (ref 26.0–34.0)
MCHC: 31.5 g/dL (ref 30.0–36.0)
MCV: 82 fL (ref 78.0–100.0)
PLATELETS: 210 10*3/uL (ref 150–400)
RBC: 3.72 MIL/uL — AB (ref 4.22–5.81)
RDW: 18 % — ABNORMAL HIGH (ref 11.5–15.5)
WBC: 5.5 10*3/uL (ref 4.0–10.5)

## 2017-09-14 LAB — COMPREHENSIVE METABOLIC PANEL
ALBUMIN: 2.4 g/dL — AB (ref 3.5–5.0)
ALK PHOS: 78 U/L (ref 38–126)
ALT: 20 U/L (ref 0–44)
AST: 22 U/L (ref 15–41)
Anion gap: 11 (ref 5–15)
BUN: 19 mg/dL (ref 8–23)
CALCIUM: 8.7 mg/dL — AB (ref 8.9–10.3)
CHLORIDE: 97 mmol/L — AB (ref 98–111)
CO2: 29 mmol/L (ref 22–32)
CREATININE: 1.29 mg/dL — AB (ref 0.61–1.24)
GFR calc Af Amer: >60 mL/min (ref >60)
GFR calc non Af Amer: 56 mL/min — ABNORMAL LOW (ref >60)
GLUCOSE: 105 mg/dL — AB (ref 70–99)
Potassium: 3.8 mmol/L (ref 3.5–5.1)
SODIUM: 137 mmol/L (ref 135–145)
Total Bilirubin: 1 mg/dL (ref 0.3–1.2)
Total Protein: 8.1 g/dL (ref 6.5–8.1)

## 2017-09-14 LAB — PROTIME-INR
INR: 3.28
INR: 2.37
PROTHROMBIN TIME: 33.1 s — AB (ref 11.4–15.2)
PROTHROMBIN TIME: 25.7 s — AB (ref 11.4–15.2)

## 2017-09-14 LAB — LACTATE DEHYDROGENASE: LDH: 188 U/L (ref 98–192)

## 2017-09-14 LAB — GLUCOSE, CAPILLARY
GLUCOSE-CAPILLARY: 190 mg/dL — AB (ref 70–99)
Glucose-Capillary: 132 mg/dL — ABNORMAL HIGH (ref 70–99)
Glucose-Capillary: 116 mg/dL — ABNORMAL HIGH (ref 70–99)

## 2017-09-14 LAB — MAGNESIUM: Magnesium: 2 mg/dL (ref 1.7–2.4)

## 2017-09-14 MED ORDER — WARFARIN SODIUM 3 MG PO TABS
3.0000 mg | ORAL_TABLET | Freq: Once | ORAL | Status: AC
  Administered 2017-09-14: 3 mg via ORAL
  Filled 2017-09-14: qty 1

## 2017-09-14 MED ORDER — WARFARIN - PHARMACIST DOSING INPATIENT
Freq: Every day | Status: DC
  Administered 2017-09-14 – 2017-09-24 (×9)

## 2017-09-14 NOTE — Progress Notes (Signed)
Physical Therapy Note  Patient Details  Name: [name] [name] MRN: [id number] Date of Birth: 1950/06/13 Today's Date: 09/14/2017   Pt's plan of care adjusted to 15/7 after speaking with care team and discussed with MD in team conference as pt currently unable to tolerate current therapy schedule with OT, PT, and SLP.    Pt not seen for 30 min session due to pt asleep following hydrotherapy and attempt to allow pt to tolerate later SLP session. Schedule has now been modified to 15/7.    Canary Brim Ivory Broad, PT, DPT  09/14/2017, 2:48 PM

## 2017-09-14 NOTE — Progress Notes (Addendum)
[place] for coumadin Indication: LVAD  Allergies  Allergen Reactions  . Penicillins Hives, Itching and Other (See Comments)    Has patient had a PCN reaction causing immediate rash, facial/tongue/throat swelling, SOB or lightheadedness with hypotension:# # Yes # # Has patient had a PCN reaction causing severe rash involving mucus membranes or skin necrosis: No Has patient had a PCN reaction that required hospitalization: No Has patient had a PCN reaction occurring within the last 10 years: No If all of the above answers are "NO", then may proceed with Cephalosporin use.     Patient Measurements: Height: 5\' 11"  (180.3 cm) Weight: 219 lb (99.3 kg) IBW/kg (Calculated) : 75.3 Heparin Dosing Weight: 98.3 kg  Vital Signs: Temp: 97.7 F (36.5 C) (07/16 0530) Temp Source: Oral (07/16 0530) BP: 86/72 (07/16 0530) Pulse Rate: 83 (07/16 0530)  Labs: Recent Labs    09/12/17 0339 09/13/17 0331 09/14/17 0407  HGB 9.4* 9.5* 9.6*  HCT 31.1* 31.3* 30.5*  PLT 252 232 210  LABPROT 25.2* 25.2* 33.1*  INR 2.31 2.31 3.28  CREATININE 1.23 1.20 1.29*    Estimated Creatinine Clearance: 66.7 mL/min (A) (by C-G formula based on SCr of 1.29 mg/dL (H)).   Medical History: Past Medical History:  Diagnosis Date  . AICD (automatic cardioverter/defibrillator) present 02/05/2014   Upgrade to Medtronic biventricular ICD, serial number  [id number]   . Atrial flutter ([place]) 04/2012   s/p TEE-EPS+RFCA 04/2012  . CAD (coronary artery disease) 5465,0354 X 2    RCA-T, 70% PL (off CFX), 99% Prox LAD/90% Dist LAD, S/P TAXUS stent x 2  . CHF (congestive heart failure) ([place])   . Chronic anticoagulation   . Chronic systolic heart failure ([place])   . CKD (chronic kidney disease)   . Diabetic retinopathy ([place])   . DM type 2 (diabetes mellitus, type 2) (H[facility])    insulin dependent  . HTN (hypertension)   . Hypercholesteremia    ablation  . ICD (implantable cardiac  defibrillator) in place   . Ischemic cardiomyopathy March 2015   20-25% 2D   . Nephrolithiasis   . Ventricular tachycardia ([place])     Assessment: 49 yoM s/p LVAD on 6/17 on warfarin per Rx. INR supratherapeutic today at 3.28 on initial labs, on repeat INR therapeutic at 2.37 - suspect lab error initially. CBC and LDH stable. No S/Sx bleeding documented.  Goal of Therapy:  INR goal 2-2.5 Monitor platelets by anticoagulation protocol: Yes   Plan:   -Warfarin 3mg  PO x1 tonight -Daily INR, CBC, LDH  Arrie Senate, PharmD, BCPS Clinical Pharmacist [phone] Please check AMION for all [facility] numbers 09/14/2017

## 2017-09-14 NOTE — Progress Notes (Signed)
Speech Language Pathology Daily Session Note  Patient Details  Name: [name] [name] MRN: [id number] Date of Birth: Jul 07, 1950  Today's Date: 09/14/2017 SLP Individual Time: 1505-1530 SLP Individual Time Calculation (min): 25 min  Short Term Goals: Week 1: SLP Short Term Goal 1 (Week 1): Pt will consume dys 2 textures and honey thick liquids with supervision cues for use of swallowing precautions and minimal overt s/s of aspiration.  SLP Short Term Goal 2 (Week 1): Pt will consume therapeutic trials of ice chips with supervision cues for use of swallowing precautions and minimal overt s/s of aspiration.  SLP Short Term Goal 3 (Week 1): Pt will recall new information with min cues for use of external aids.   SLP Short Term Goal 4 (Week 1): Pt will complete basic familiar tasks with min cues for functional problem solving.   SLP Short Term Goal 5 (Week 1): Pt will return demonstration of EMST trainer with mod I and for 25 repetitions a self reported effort level of <7/10.  SLP Short Term Goal 6 (Week 1): Pt will increase vocal intensity to achieve intelligibility at the sentence level with min assist.   Skilled Therapeutic Interventions:  Pt was seen for skilled ST targeting cognitive goals.  SLP administered the Cognistat for formal assessment of cognitive-linguistic function.  Pt scored WFL on all areas of assessment with the exception of memory and constructional ability subtests.  Pt was borderline impaired for memory but scored in the range of severe impairment for constructional ability.  Pt had difficulty replicating patterns from images, even when SLP provided demonstration cues.  Discussed results with pt who was in agreement with recommended plan of care.  Pt was left in bed with family at bedside.  Continue per current plan of care.    Function:  Eating Eating                 Cognition Comprehension Comprehension assist level: Follows basic conversation/direction with no  assist  Expression   Expression assist level: Expresses basic 50 - 74% of the time/requires cueing 25 - 49% of the time. Needs to repeat parts of sentences.  Social Interaction Social Interaction assist level: Interacts appropriately 90% of the time - Needs monitoring or encouragement for participation or interaction.  Problem Solving Problem solving assist level: Solves basic 75 - 89% of the time/requires cueing 10 - 24% of the time  Memory Memory assist level: Recognizes or recalls 75 - 89% of the time/requires cueing 10 - 24% of the time    Pain Pain Assessment Pain Scale: 0-10 Pain Score: 0-No pain  Therapy/Group: Individual Therapy  Bassam Dresch, Selinda Orion 09/14/2017, 4:05 PM

## 2017-09-14 NOTE — Progress Notes (Signed)
Physical Therapy Wound Treatment Patient Details  Name: [name] [name] MRN: [id number] Date of Birth: 01-12-1951  Today's Date: 09/14/2017 PT Individual Time: 3716-9678 PT Individual Time Calculation (min): 40 min   Subjective  Subjective: Agreeable to therapy. Patient and Family Stated Goals: Heal wound Prior Treatments: Wet to dry dressing changes with Santyl  Pain Score:  Pt premedicated. Continues to appear painful with debridement.   Wound Assessment  Pressure Injury 08/27/17 Unstageable - Full thickness tissue loss in which the base of the ulcer is covered by slough (yellow, tan, gray, green or brown) and/or eschar (tan, brown or black) in the wound bed. 8.5 x 6 (90% darkened 5% yellow and 5% pink (Active)  Wound Image   09/10/2017 10:51 AM  Dressing Type ABD;Barrier Film (skin prep);Gauze (Comment);Moist to dry 09/14/2017  2:00 PM  Dressing Clean;Dry;Intact 09/14/2017  2:00 PM  Dressing Change Frequency Daily 09/14/2017  2:00 PM  State of Healing Non-healing 09/14/2017  2:00 PM  Site / Wound Assessment Pink;Yellow;Brown 09/14/2017  2:00 PM  % Wound base Red or Granulating 40% 09/14/2017  2:00 PM  % Wound base Yellow/Fibrinous Exudate 55% 09/14/2017  2:00 PM  % Wound base Black/Eschar 5% 09/14/2017  2:00 PM  Peri-wound Assessment Intact 09/14/2017  2:00 PM  Wound Length (cm) 9.1 cm 09/10/2017 10:51 AM  Wound Width (cm) 7.5 cm 09/10/2017 10:51 AM  Wound Depth (cm) 0.2 cm 09/10/2017 10:51 AM  Wound Surface Area (cm^2) 68.25 cm^2 09/10/2017 10:51 AM  Wound Volume (cm^3) 13.65 cm^3 09/10/2017 10:51 AM  Tunneling (cm) 0 09/10/2017 10:51 AM  Undermining (cm) 0 09/10/2017 10:51 AM  Margins Unattached edges (unapproximated) 09/14/2017  2:00 PM  Drainage Amount Moderate 09/14/2017  2:00 PM  Drainage Description Purulent 09/14/2017  2:00 PM  Treatment Debridement (Selective);Hydrotherapy (Pulse lavage);Packing (Saline gauze) 09/14/2017  2:00 PM   Santyl applied to wound bed prior to applying dressing.       Hydrotherapy Pulsed lavage therapy - wound location: R buttock Pulsed Lavage with Suction (psi): 8 psi Pulsed Lavage with Suction - Normal Saline Used: 1000 mL Pulsed Lavage Tip: Tip with splash shield Selective Debridement Selective Debridement - Location: R buttock Selective Debridement - Tools Used: Forceps;Scalpel Selective Debridement - Tissue Removed: Brown and yellow necrotic tissue   Wound Assessment and Plan  Wound Therapy - Assess/Plan/Recommendations Wound Therapy - Clinical Statement: Progressing with debridement and overall wound bed appears improved. Pt will benefit from continued hydrotherapy and selective debridement to reduce bioburden and promote wound bed healing.  Wound Therapy - Functional Problem List: Decreased tolerance for OOB and position changes Factors Delaying/Impairing Wound Healing: Diabetes Mellitus;Immobility;Multiple medical problems Hydrotherapy Plan: Debridement;Dressing change;Patient/family education;Pulsatile lavage with suction Wound Therapy - Frequency: 6X / week Wound Therapy - Follow Up Recommendations: Other (comment)(CIR) Wound Plan: See above  Wound Therapy Goals- Improve the function of patient's integumentary system by progressing the wound(s) through the phases of wound healing (inflammation - proliferation - remodeling) by: Decrease Necrotic Tissue to: 0% Decrease Necrotic Tissue - Progress: Progressing toward goal Increase Granulation Tissue to: 100% Increase Granulation Tissue - Progress: Progressing toward goal Goals/treatment plan/discharge plan were made with and agreed upon by patient/family: Yes Time For Goal Achievement: 7 days Wound Therapy - Potential for Goals: Good  Goals will be updated until maximal potential achieved or discharge criteria met.  Discharge criteria: when goals achieved, discharge from hospital, MD decision/surgical intervention, no progress towards goals, refusal/missing three consecutive treatments  without notification or medical reason.  GP  Thelma Comp 09/14/2017, 2:46 PM   Rolinda Roan, PT, DPT Acute Rehabilitation Services Pager: [phone]

## 2017-09-14 NOTE — Progress Notes (Signed)
Recreational Therapy Session Note  Patient Details  Name: [name] [name] MRN: [id number] Date of Birth: January 06, 1951 Today's Date: 09/14/2017  Order received, per team, pt not appropriate for TR services at this time.  Will continue to monitor for future participation through team. [place] 09/14/2017, 1:48 PM

## 2017-09-14 NOTE — Progress Notes (Signed)
Occupational Therapy Session Note  Patient Details  Name: [name] [name] MRN: [id number] Date of Birth: 07-Feb-1951  Today's Date: 09/14/2017 OT Individual Time: 8182-9937 OT Individual Time Calculation (min): 75 min    Short Term Goals: Week 1:  OT Short Term Goal 1 (Week 1): Pt will complete toilet transfer with Min A and LRAD OT Short Term Goal 2 (Week 1): Pt will complete bathing with Min A and sit<stand level OT Short Term Goal 3 (Week 1): Pt will don shirt with supervision   Skilled Therapeutic Interventions/Progress Updates:    1:1 Pt declined bathing - reporting he he had already bathed with "wipes." Pt sitting in recliner on bed pad on roho cushion. Educated provided on not putting bed pad in between pt and the cushion for the cushion to still provide pressure relief proprieties. Pt required max instructional cues to switch LVAD from wall unit to batteries. Pt did ask questions in session about whether he needed to carry his Heart Mate unit with him all the time.  More than reasonable time with supervision to ambulate to the bathroom. Pt required A to perform toileting hygiene after BM due to being afraid of hurting himself/ his wound. Pt ambulated back out to sink with close supervision with more than reasonable amt of time. Pt performing oral care with suction and washed face with setup. Pt able to ambulate~20 feet before reported extreme fatigue.  Pt with prolonged rest break. Pt unable to perform 3 min on the Sci Fit- UE bike; only able to perform 1 1/2 min before fatigue. Transferred back in bed with min A for LEs.   Therapy Documentation Precautions:  Precautions Precautions: Sternal, Fall Precaution Comments: LVAD, sacral wound Restrictions Weight Bearing Restrictions: No Pain: Pain Assessment Pain Location: Buttocks Pain Orientation: Right;Left Pain Descriptors / Indicators: Sore Pain Onset: On-going Patients Stated Pain Goal: 2 Pain Intervention(s):  Repositioned ADL: ADL ADL Comments: Please see functional navigator for ADL status  See Function Navigator for Current Functional Status.   Therapy/Group: Individual Therapy  [name] 09/14/2017, 2:54 PM

## 2017-09-14 NOTE — Progress Notes (Signed)
LVAD Coordinator Rounding Note  Admitted 08/11/17 for initiation of Milrinone. 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 He was evaluated [facility] /Dr Posey Pronto on 5/9 for possible heart transplant.   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 Prescott Gum under Destination Therapy criteria. Dr [name] discussed heart transplant candidacy with Dr Posey Pronto. Given size and blood type there was concern he would not make it to transplant.   Pt in bed, states he just finished working with PT.  Vital signs: Temp:  97.7 HR: 83 afib Auto BP:  86/72(79) Doppler:  81 O2 Sat:  100 % on RA Wt: 238.....260>239>227>225>229>229>227>222>228>225>222>221>216>215>220>219 lbs   LVAD interrogation reveals:  Speed: 5300 Flow: 3.7 Power: 3.7w PI:  6.1 Alarms: none  Events: none Hematocrit:  31 Fixed speed: 5300 Low speed limit: 5000  Drive Line: Daily dressing changes Left abd dressing dry and intact; anchors intact and accurately applied. Wife will change the dressing today.   Labs:  LDH trend: 274......>423>397>267>306>239>245>236>223>205>216>205>197>177>188  INR trend:  1.71.......2.67>2.61>2.65>2.58>2.06>1.89>1.96>1.97>1.92>2.31>3.28  Anticoagulation Plan: -INR Goal: 2.0 - 2.5 -ASA Dose: 81 mg daily   Blood Products:  Intra op: - 08/16/17> one platelet; 2 FFP - 08/17/17> ome FFP  Post op: - 08/16/17 > one unit PCs - 08/21/17 > one unit PCs - 08/24/17 > one unit PCs  Device:  - BiV Medtronic - Therapies: off  Respiratory: extubated 08/27/17  Nitric Oxide: off 08/21/17  Gtts: - Epi 1 mcg/min - stopped 08/31/17 - Milrinone 0.125 mcg/kg/min - off 09/08/17  Adverse Events on VAD: - 08/17/17>mediastinal reexploration with evacuation of mediastinal clot - 08/19/17>sternal closure  VAD education:  1. Caregiver is changing drive line dressing independently.  2. VAD discharge education with patient and family completed on 09/09/17.  Plan/Recommendations:  1. Daily  dressing changes per VAD coordinator, nurse champion, or trained caregiver. 2. Page VAD Coordinator with any VAD equipment or drive line issues.    Tanda Rockers RN, VAD Coordinator 24/7 VAD Pager: [phone]

## 2017-09-14 NOTE — Progress Notes (Signed)
[facility] PHYSICAL MEDICINE & REHABILITATION     PROGRESS NOTE    Subjective/Complaints: No complaints this morning. Did better with T#3 yesterday.   ROS: Patient denies fever, rash, sore throat, blurred vision, nausea, vomiting, diarrhea, cough, shortness of breath or chest pain, joint or back pain, headache, or mood change.   Objective:  No results found. Recent Labs    09/13/17 0331 09/14/17 0407  WBC 6.6 5.5  HGB 9.5* 9.6*  HCT 31.3* 30.5*  PLT 232 210   Recent Labs    09/13/17 0331 09/14/17 0407  NA 136 137  K 3.6 3.8  CL 99 97*  GLUCOSE 95 105*  BUN 20 19  CREATININE 1.20 1.29*  CALCIUM 8.7* 8.7*   CBG (last 3)  Recent Labs    09/13/17 1137 09/13/17 1638 09/13/17 2143  GLUCAP 135* 110* 158*    Wt Readings from Last 3 Encounters:  09/14/17 99.3 kg (219 lb)  09/10/17 97.8 kg (215 lb 8 oz)  07/22/17 103.9 kg (229 lb)     Intake/Output Summary (Last 24 hours) at 09/14/2017 0842 Last data filed at 09/14/2017 0500 Gross per 24 hour  Intake 600 ml  Output 1250 ml  Net -650 ml    Vital Signs: Blood pressure (!) 86/72, pulse 83, temperature 97.7 F (36.5 C), temperature source Oral, resp. rate 18, height 5\' 11"  (1.803 m), weight 99.3 kg (219 lb), SpO2 94 %. Physical Exam:  Constitutional: No distress . Vital signs reviewed. HEENT: EOMI, oral membranes moist, dysphonic Neck: supple Cardiovascular: hum   Respiratory: CTA Bilaterally without wheezes or rales. Normal effort    GI: BS +, non-tender, non-distended  Musculoskeletal:  1+ LE edema Neurological: He is oriented to person, place, and time. No cranial nerve deficit.   alert. UE 4- to 4/5 prox to distal. LE: 2+/5 HF, 3/5 KE and 4/5 ADF/PF. No sensory deficits. Fair insight and awareness. Slow to process  Skin: Skin is warm. Sacral wound dressed  abd distention  Psychiatric: pleasant and cooperative     Assessment/Plan: 1. Functional deficits secondary to debility after CHF/LVAD/VDRF which  require 3+ hours per day of interdisciplinary therapy in a comprehensive inpatient rehab setting. Physiatrist is providing close team supervision and 24 hour management of active medical problems listed below. Physiatrist and rehab team continue to assess barriers to discharge/monitor patient progress toward functional and medical goals.  Function:  Bathing Bathing position   Position: Sitting EOB  Bathing parts Body parts bathed by patient: Right arm, Left arm, Abdomen, Chest, Front perineal area, Right upper leg, Left upper leg, Left lower leg, Right lower leg Body parts bathed by helper: Back  Bathing assist Assist Level: Touching or steadying assistance(Pt > 75%)      Upper Body Dressing/Undressing Upper body dressing   What is the patient wearing?: Hospital gown     Pull over shirt/dress - Perfomed by patient: Thread/unthread right sleeve, Thread/unthread left sleeve, Put head through opening, Pull shirt over trunk          Upper body assist Assist Level: Set up      Lower Body Dressing/Undressing Lower body dressing   What is the patient wearing?: Non-skid slipper socks     Pants- Performed by patient: Thread/unthread right pants leg, Pull pants up/down Pants- Performed by helper: Thread/unthread left pants leg   Non-skid slipper socks- Performed by helper: Don/doff right sock, Don/doff left sock  Lower body assist        Toileting Toileting Toileting activity did not occur: No continent bowel/bladder event Toileting steps completed by patient: Adjust clothing prior to toileting, Performs perineal hygiene, Adjust clothing after toileting Toileting steps completed by helper: Adjust clothing prior to toileting, Performs perineal hygiene, Adjust clothing after toileting(per Gwendolyn Grant, NT report)    Toileting assist Assist level: Two helpers(per Thea Alken, NT report)   Transfers Chair/bed transfer Chair/bed transfer activity did not  occur: Refused Chair/bed transfer method: Stand pivot Chair/bed transfer assist level: Touching or steadying assistance (Pt > 75%) Chair/bed transfer assistive device: Medical sales representative Ambulation activity did not occur: Scientist, research (physical sciences) activity did not occur: Refused        Cognition Comprehension Comprehension assist level: Follows basic conversation/direction with extra time/assistive device  Expression Expression assist level: Expresses basic needs/ideas: With no assist  Social Interaction Social Interaction assist level: Interacts appropriately 75 - 89% of the time - Needs redirection for appropriate language or to initiate interaction.  Problem Solving Problem solving assist level: Solves basic 75 - 89% of the time/requires cueing 10 - 24% of the time  Memory Memory assist level: Recognizes or recalls 75 - 89% of the time/requires cueing 10 - 24% of the time   Medical Problem List and Plan:  1. Functional and mobility deficits secondary to debility after congestive heart failure/LVAD/VDRF and multiple other medical issues  -beginning therapies  2. DVT Prophylaxis/Anticoagulation: Pharmaceutical: Coumadin INR therapeutic 3. Pain Management: tylenol prn.   -T#3 for hydrotherapy 4. Mood: LCSW to follow for evaluation and support.  5. Neuropsych: This patient is capable of making decisions on his own behalf.  6. Deep tissue injury bilateral buttocks/Skin/Wound Care: Air mattress overlay. Boost when in chair. Hydrotherapy to buttocks M->Sa.   -T#3 as above 7. Fluids/Electrolytes/Nutrition: Strict I/O. Continue nutritional supplement to promote wound healing   -I personally reviewed the patient's labs today.   8. T2DM: Monitor BS ac/hs. Continue Lantus   -good control 7/16 9. A fib: Off amiodarone.   10. 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 Started on [facility] 7/12. Continue Spironolactone and digoxin daily. Monitor renal status  serially.  11. [name]/VF: S/p emergent cardioversion 6/24 and 6/26-->ICD turned on 7/12. To keep K> 4.0 and Mg> 2.0  12. ABLA: Improved with transfusion.  Hemoglobin 9.4 on 7/14 13. Dysphagia: Continue dysphagia 2, honey liquids with chin tuck. Hypernatremia has resolved.  14. LVAD implant 6/17: On coumadin and low dose ASA.   -Education ongoing with patient and wife.   -Dressing changes per VAD coordinator or trained caregiver.   LOS (Days) [place] EVALUATION WAS PERFORMED  Meredith Staggers, MD 09/14/2017 8:42 AM

## 2017-09-14 NOTE — Progress Notes (Signed)
Physical Therapy Session Note  Patient Details  Name: [name] [name] MRN: [id number] Date of Birth: Mar 06, 1950  Today's Date: 09/14/2017 PT Individual Time: 5176-1607 PT Individual Time Calculation (min): 60 min   Short Term Goals: Week 1:  PT Short Term Goal 1 (Week 1): Pt will ambulate 25' w/ min assist PT Short Term Goal 2 (Week 1): Pt will maintain sternal precautions during functional mobility w/ min cues 50% of the time PT Short Term Goal 3 (Week 1): Pt will perform bed<>chair transfer w/ min assist PT Short Term Goal 4 (Week 1): Pt will demonstrate safe transition of LVAD between battery and wall power w/o cues 50% of the time  Skilled Therapeutic Interventions/Progress Updates:    pt performs supine <> sit with increased time, supervision.  Sit to stand with RW throughout session with supervision.  Gait training 15', 60', 60' with RW and supervision. Distance limited by pt c/o LE fatigue.  Stair negotiation with 2 handrails with min A for 2 steps x 2.  kinetron for LE strength and endurance 3 x 1 minute.  Pt reports need to use restroom. Pt performs transfers with grab bar and supervision.  Total A for hygiene.  Pt total A for LVAD management at end of session due to fatigue. Pt left in bed on side with needs at hand, family present.  Therapy Documentation Precautions:  Precautions Precautions: Sternal, Fall Precaution Comments: LVAD, sacral wound Restrictions Weight Bearing Restrictions: No Pain:  pt c/o pain in buttocks when sitting on toilet, eases when out of position. Pt states he does not want pain meds at this time   Therapy/Group: Individual Therapy  Gerrianne Aydelott 09/14/2017, 11:26 AM

## 2017-09-14 NOTE — Progress Notes (Signed)
CARDIAC REHAB PHASE I   Cardiac Rehab does not follow pts on rehab. Refer to PT for therapy.  Rufina Falco, RN BSN 09/14/2017 9:04 AM

## 2017-09-14 NOTE — Progress Notes (Addendum)
Patient ID: [name] [name], male   DOB: 04/06/1950, 67 y.o.   MRN: [id number]     Advanced Heart Failure Rounding Note  PCP-Cardiologist: No primary care provider on file.   Subjective:    Events 6/17 Underwent HM-3 implant -> Unable to close chest with RV failure and high intrathoracic pressure 6/18 Taken back to OR for evacuation of hematoma. NO RVAD needed. Left sternum open  08/19/17 Taken back to OR for sternal closure 6/22 VT => amiodarone started.  6/24 VT/VF=>urgent cardioversion. 6/25 VT  =>urgent cardioversion  6/27 Extubated 7/12 to CIR  Feeling OK today. Remains SOB with exertion but gradually improving. Coming back from PT on my exam.   LVAD Interrogation HM 3: Speed: 5300 Flow: 4.2 PI: 3.1 Power: 4.0. VAD interrogated personally. Parameters stable.    Objective:   Weight Range: 219 lb (99.3 kg) Body mass index is 30.54 kg/m.   Vital Signs:   Temp:  [97.7 F (36.5 C)-98.4 F (36.9 C)] 97.7 F (36.5 C) (07/16 0530) Pulse Rate:  [32-83] 83 (07/16 0530) Resp:  [16-20] 18 (07/16 0530) BP: (77-97)/(51-74) 86/72 (07/16 0530) SpO2:  [93 %-95 %] 94 % (07/16 0530) Weight:  [219 lb (99.3 kg)] 219 lb (99.3 kg) (07/16 0530) Last BM Date: 09/13/17   MAP 70-80s  Weight change: Filed Weights   09/12/17 0300 09/13/17 0500 09/14/17 0530  Weight: 220 lb (99.8 kg) 220 lb 14.4 oz (100.2 kg) 219 lb (99.3 kg)   Intake/Output:   Intake/Output Summary (Last 24 hours) at 09/14/2017 1034 Last data filed at 09/14/2017 0500 Gross per 24 hour  Intake 480 ml  Output 1250 ml  Net -770 ml   Physical Exam    Physical Exam: General: NAD.  HEENT: Normal. Neck: Supple, JVP 6-7 cm. Carotids OK.  Cardiac:  Mechanical heart sounds with LVAD hum present.  Lungs:  CTAB, normal effort.  Abdomen:  NT, ND, no HSM. No bruits or masses. +BS  LVAD exit site: Dressing dry and intact. No erythema or drainage. Stabilization device present and accurately applied. Driveline dressing  changed daily per sterile technique. Extremities:  Warm and dry. No cyanosis, clubbing, rash, or edema.  Neuro:  Alert & oriented x 3. Cranial nerves grossly intact. Moves all 4 extremities w/o difficulty. Affect pleasant      Telemetry   N/A  Labs    CBC Recent Labs    09/13/17 0331 09/14/17 0407  WBC 6.6 5.5  HGB 9.5* 9.6*  HCT 31.3* 30.5*  MCV 83.2 82.0  PLT 232 416   Basic Metabolic Panel Recent Labs    09/13/17 0331 09/14/17 0407  NA 136 137  K 3.6 3.8  CL 99 97*  CO2 27 29  GLUCOSE 95 105*  BUN 20 19  CREATININE 1.20 1.29*  CALCIUM 8.7* 8.7*  MG 1.9 2.0   Liver Function Tests Recent Labs    09/13/17 0331 09/14/17 0407  AST 25 22  ALT 22 20  ALKPHOS 83 78  BILITOT 1.1 1.0  PROT 7.8 8.1  ALBUMIN 2.4* 2.4*   No results for input(s): LIPASE, AMYLASE in the last 72 hours. Cardiac Enzymes No results for input(s): CKTOTAL, CKMB, CKMBINDEX, TROPONINI in the last 72 hours.  BNP: BNP (last 3 results) Recent Labs    08/30/17 0000 09/05/17 2327 09/13/17 0331  BNP 114.6* 86.1 53.3    ProBNP (last 3 results) Recent Labs    11/12/16 1559  PROBNP 388.0*     D-Dimer No results for  input(s): DDIMER in the last 72 hours. Hemoglobin A1C No results for input(s): HGBA1C in the last 72 hours. Fasting Lipid Panel No results for input(s): CHOL, HDL, LDLCALC, TRIG, CHOLHDL, LDLDIRECT in the last 72 hours. Thyroid Function Tests No results for input(s): TSH, T4TOTAL, T3FREE, THYROIDAB in the last 72 hours.  Invalid input(s): FREET3  Other results:   Imaging    No results found.   Medications:     Scheduled Medications: . aspirin  81 mg Oral Daily  . bisacodyl  10 mg Oral Daily   Or  . bisacodyl  10 mg Rectal Daily  . Chlorhexidine Gluconate Cloth  6 each Topical QHS  . collagenase   Topical Daily  . famotidine  20 mg Oral BID  . insulin aspart  0-15 Units Subcutaneous TID WC  . insulin aspart  0-5 Units Subcutaneous QHS  . insulin  detemir  25 Units Subcutaneous QHS  . magnesium oxide  400 mg Oral BID  . mouth rinse  15 mL Mouth Rinse BID  . multivitamin with minerals  1 tablet Oral Daily  . nystatin  5 mL Oral QID  . potassium chloride  40 mEq Oral Daily  . protein supplement shake  11 oz Oral BID BM  . sodium chloride flush  10-40 mL Intracatheter Q12H  . spironolactone  25 mg Oral Daily  . torsemide  60 mg Oral Daily  . vitamin C  500 mg Oral BID  . Warfarin - Pharmacist Dosing Inpatient   Does not apply q1800    Infusions: . lactated ringers      PRN Medications: acetaminophen, acetaminophen-codeine, alum & mag hydroxide-simeth, bisacodyl, clonazepam, diphenhydrAMINE, guaiFENesin-dextromethorphan, lactated ringers, levalbuterol, polyethylene glycol, prochlorperazine **OR** prochlorperazine **OR** prochlorperazine, RESOURCE THICKENUP CLEAR, sodium chloride flush, sodium phosphate, traZODone    Patient Profile  [name] is a 67 y.o. 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   Directly admitted with persistent low cardiac output for milrinone initiation for home.  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 Acute/Chronic systolic CHF with biventricular failure-> cardiogenic shock: Echo  08/13/2017 EF 20-25%. s/p  Medtronic BiV ICD in place. Cath 12/18 with stable 1v CAD.  s/p HM-3 implant 6/17. Unable to close chest due to high-intrathoracic pressures and RV failure. Taken back to OR 08/17/17 with evacuation of hematoma with improvement. Extubated 6/28.  Ramp ECHO 7/11 with speed increased to 5300.  - Volume status stable.  - Continue torsemide 60 mg daily  - Continue spiro 25 mg daily.  2. 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 LDH 180. Ramp ECHO speed increased to 5300.   - VAD interrogated personally. Parameters stable.   - Continue warfarin + ASA 81, INR 3.28. Discussed dosing  with PharmD personally.   3. Acute on CKD II-III:  - Resolved.  4. [name]/VF: Recurrent VT on 6/22.  S/P VF 6/24 emergent bedside cardioversion. S/P VT 6/26 emergent bedside cardioversion. ICD turned back on.   - - No change to current plan.    5. AFL/atrial fibrillation: S/p previous ablation. He is currently in atrial fibrillation.  - Rate controlled. Amio stopped.  - On coumadin. No bleeding.  6. [name]: Recent cath with stable CAD as above: No chest pain.  7. [name]: Recent A1c 8.3 on 6/3.  -  Continue current regimen.  8. Anemia: Post-op.  Got 1 unit PRBCs 6/22 and 6/25. - Stable. Hgb 9.6  9. Deconditioning: Severe, working with PT aggressively.   - No change to current plan.   10. Unstageable Pressure Ulcer: R and L buttock.  R buttock with necrosis noted. Continue santyl for selective debridement. Change daily. Continue air overlay. Needs to reposition every hour when in the chair. Keep R /L in bed. . May need a different chair cushion. Added hydrotherapy.   - WOC following.    I reviewed the LVAD parameters from today, and compared the results to the patient's prior recorded data.  No programming changes were made.  The LVAD is functioning within specified parameters.  The patient performs LVAD self-test daily.  LVAD interrogation was negative for any significant power changes, alarms or PI events/speed drops.  LVAD equipment check completed and is in good working order.  Back-up equipment present.   LVAD education done on emergency procedures and precautions and reviewed exit site care.   Length of Stay: 4  Annamaria Helling  09/14/2017, 10:34 AM  Advanced Heart Failure Team Pager [phone] (M-F; 7a - 4p)  Please contact [facility] for night-coverage after hours (4p -7a ) and weekends on [email or web address]  Agree with the above note.  MAP stable.  INR therapeutic.  Hgb stable.  LVAD parameters reviewed and stable. Slow progress with PT.   Loralie Champagne 09/14/2017

## 2017-09-15 ENCOUNTER — Ambulatory Visit (HOSPITAL_COMMUNITY): Payer: PPO

## 2017-09-15 ENCOUNTER — Inpatient Hospital Stay (HOSPITAL_COMMUNITY): Payer: PPO | Admitting: Physical Therapy

## 2017-09-15 ENCOUNTER — Inpatient Hospital Stay (HOSPITAL_COMMUNITY): Payer: PPO | Admitting: Speech Pathology

## 2017-09-15 ENCOUNTER — Inpatient Hospital Stay (HOSPITAL_COMMUNITY): Payer: PPO | Admitting: Occupational Therapy

## 2017-09-15 LAB — CBC
HEMATOCRIT: 30.9 % — AB (ref 39.0–52.0)
Hemoglobin: 9.5 g/dL — ABNORMAL LOW (ref 13.0–17.0)
MCH: 25.4 pg — ABNORMAL LOW (ref 26.0–34.0)
MCHC: 30.7 g/dL (ref 30.0–36.0)
MCV: 82.6 fL (ref 78.0–100.0)
PLATELETS: 189 10*3/uL (ref 150–400)
RBC: 3.74 MIL/uL — ABNORMAL LOW (ref 4.22–5.81)
RDW: 18.3 % — AB (ref 11.5–15.5)
WBC: 5.3 10*3/uL (ref 4.0–10.5)

## 2017-09-15 LAB — COMPREHENSIVE METABOLIC PANEL
ALBUMIN: 2.4 g/dL — AB (ref 3.5–5.0)
ALT: 20 U/L (ref 0–44)
ANION GAP: 9 (ref 5–15)
AST: 23 U/L (ref 15–41)
Alkaline Phosphatase: 76 U/L (ref 38–126)
BILIRUBIN TOTAL: 0.9 mg/dL (ref 0.3–1.2)
BUN: 18 mg/dL (ref 8–23)
CO2: 28 mmol/L (ref 22–32)
Calcium: 8.7 mg/dL — ABNORMAL LOW (ref 8.9–10.3)
Chloride: 97 mmol/L — ABNORMAL LOW (ref 98–111)
Creatinine, Ser: 1.15 mg/dL (ref 0.61–1.24)
GFR calc Af Amer: >60 mL/min (ref >60)
Glucose, Bld: 100 mg/dL — ABNORMAL HIGH (ref 70–99)
POTASSIUM: 3.6 mmol/L (ref 3.5–5.1)
Sodium: 134 mmol/L — ABNORMAL LOW (ref 135–145)
TOTAL PROTEIN: 7.7 g/dL (ref 6.5–8.1)

## 2017-09-15 LAB — LACTATE DEHYDROGENASE: LDH: 179 U/L (ref 98–192)

## 2017-09-15 LAB — GLUCOSE, CAPILLARY
GLUCOSE-CAPILLARY: 159 mg/dL — AB (ref 70–99)
GLUCOSE-CAPILLARY: 102 mg/dL — AB (ref 70–99)
Glucose-Capillary: 83 mg/dL (ref 70–99)
Glucose-Capillary: 162 mg/dL — ABNORMAL HIGH (ref 70–99)

## 2017-09-15 LAB — PROTIME-INR
INR: 2.49
Prothrombin Time: 26.7 seconds — ABNORMAL HIGH (ref 11.4–15.2)

## 2017-09-15 LAB — MAGNESIUM: MAGNESIUM: 1.9 mg/dL (ref 1.7–2.4)

## 2017-09-15 MED ORDER — WARFARIN SODIUM 3 MG PO TABS
3.0000 mg | ORAL_TABLET | Freq: Once | ORAL | Status: AC
  Administered 2017-09-15: 3 mg via ORAL
  Filled 2017-09-15: qty 1

## 2017-09-15 NOTE — Consult Note (Addendum)
[place] Nurse wound follow up Wound type:unstageable pressure wound  Measurement:(see Hydrotherapy note for today's measurement) Wound FOY:DXAJO buttock 40% pink, 60% white (PT Measurements 9.1cm x 7.5cm x 0.2cm) Left buttock has two smaller full thicknes wounds 100% pink Drainage (amount, consistency, odor) serosanguinous  Periwound:intact Dressing procedure/placement/frequency:Continue with Santyl and Hydrotherapy as ordered. [place] team will continue a weekly assessment. If need assistance in between visits please re-consult.  Fara Olden, RN-C, WTA-C, [facility] Wound Treatment Associate Ostomy Care Associate

## 2017-09-15 NOTE — Progress Notes (Signed)
[facility] PHYSICAL MEDICINE & REHABILITATION     PROGRESS NOTE    Subjective/Complaints: Up in bed. No new complaints. Restless last night per routine  ROS: Patient denies fever, rash, sore throat, blurred vision, nausea, vomiting, diarrhea, cough, shortness of breath or chest pain,   headache, or mood change.   Objective:  No results found. Recent Labs    09/14/17 0407 09/15/17 0354  WBC 5.5 5.3  HGB 9.6* 9.5*  HCT 30.5* 30.9*  PLT 210 189   Recent Labs    09/14/17 0407 09/15/17 0354  NA 137 134*  K 3.8 3.6  CL 97* 97*  GLUCOSE 105* 100*  BUN 19 18  CREATININE 1.29* 1.15  CALCIUM 8.7* 8.7*   CBG (last 3)  Recent Labs    09/14/17 1651 09/14/17 2114 09/15/17 0646  GLUCAP 116* 190* 83    Wt Readings from Last 3 Encounters:  09/15/17 96.2 kg (212 lb)  09/10/17 97.8 kg (215 lb 8 oz)  07/22/17 103.9 kg (229 lb)     Intake/Output Summary (Last 24 hours) at 09/15/2017 0755 Last data filed at 09/15/2017 0649 Gross per 24 hour  Intake 720 ml  Output 2050 ml  Net -1330 ml    Vital Signs: Blood pressure (!) 91/53, pulse 68, temperature 98.7 F (37.1 C), temperature source Oral, resp. rate 18, height 5\' 11"  (1.803 m), weight 96.2 kg (212 lb), SpO2 95 %. Physical Exam:  Constitutional: No distress . Vital signs reviewed. HEENT: EOMI, oral membranes moist Neck: supple Cardiovascular: Hum. No JVD    Respiratory: CTA Bilaterally without wheezes or rales. Normal effort    GI: BS +, non-tender, non-distended  Musculoskeletal:  1+ LE edema Neurological: He is oriented to person, place, and time. No cranial nerve deficit.   alert. UE 4- to 4/5 prox to distal. LE: 2+/5 HF, 3/5 KE and 4/5 ADF/PF. No sensory deficits. Fair insight and awareness. Slow to process  Skin: Skin is warm. Sacral wound dressed, not visualized today   abd dressing intact  Psychiatric: pleasant and cooperative     Assessment/Plan: 1. Functional deficits secondary to debility after  CHF/LVAD/VDRF which require 2hr per day over 7 days of interdisciplinary therapy in a comprehensive inpatient rehab setting. Physiatrist is providing close team supervision and 24 hour management of active medical problems listed below. Physiatrist and rehab team continue to assess barriers to discharge/monitor patient progress toward functional and medical goals.  Function:  Bathing Bathing position   Position: Sitting EOB  Bathing parts Body parts bathed by patient: Right arm, Left arm, Abdomen, Chest, Front perineal area, Right upper leg, Left upper leg, Left lower leg, Right lower leg Body parts bathed by helper: Back  Bathing assist Assist Level: Touching or steadying assistance(Pt > 75%)      Upper Body Dressing/Undressing Upper body dressing   What is the patient wearing?: Hospital gown     Pull over shirt/dress - Perfomed by patient: Thread/unthread right sleeve, Thread/unthread left sleeve, Put head through opening, Pull shirt over trunk          Upper body assist Assist Level: Set up      Lower Body Dressing/Undressing Lower body dressing   What is the patient wearing?: Non-skid slipper socks     Pants- Performed by patient: Thread/unthread right pants leg, Pull pants up/down Pants- Performed by helper: Thread/unthread left pants leg   Non-skid slipper socks- Performed by helper: Don/doff right sock, Don/doff left sock  Lower body assist        Toileting Toileting Toileting activity did not occur: No continent bowel/bladder event Toileting steps completed by patient: Adjust clothing prior to toileting, Performs perineal hygiene, Adjust clothing after toileting Toileting steps completed by helper: Adjust clothing prior to toileting, Performs perineal hygiene, Adjust clothing after toileting(per Gwendolyn Grant, NT report)    Toileting assist Assist level: Two helpers(per Thea Alken, NT report)   Transfers Chair/bed transfer Chair/bed  transfer activity did not occur: Refused Chair/bed transfer method: Stand pivot Chair/bed transfer assist level: Supervision or verbal cues Chair/bed transfer assistive device: Medical sales representative Ambulation activity did not occur: Refused   Max distance: 60 Assist level: Supervision or verbal cues   Wheelchair Wheelchair activity did not occur: Refused        Cognition Comprehension Comprehension assist level: Follows basic conversation/direction with no assist  Expression Expression assist level: Expresses basic 50 - 74% of the time/requires cueing 25 - 49% of the time. Needs to repeat parts of sentences.  Social Interaction Social Interaction assist level: Interacts appropriately 90% of the time - Needs monitoring or encouragement for participation or interaction.  Problem Solving Problem solving assist level: Solves basic 75 - 89% of the time/requires cueing 10 - 24% of the time  Memory Memory assist level: Recognizes or recalls 75 - 89% of the time/requires cueing 10 - 24% of the time   Medical Problem List and Plan:  1. Functional and mobility deficits secondary to debility after congestive heart failure/LVAD/VDRF and multiple other medical issues  -poor activity tolerance -decreased to 2 hrs over 7 day intensity 2. DVT Prophylaxis/Anticoagulation: Pharmaceutical: Coumadin INR therapeutic 3. Pain Management: tylenol prn.   -T#3 for hydrotherapy 4. Mood: LCSW to follow for evaluation and support.  5. Neuropsych: This patient is capable of making decisions on his own behalf.  6. Deep tissue injury bilateral buttocks/Skin/Wound Care: Air mattress overlay.   -Hydrotherapy to buttocks M->Sa.   -T#3 as above 7. Fluids/Electrolytes/Nutrition: Strict I/O. Continue nutritional supplement to promote wound healing   -I personally reviewed the patient's labs today.   8. T2DM: Monitor BS ac/hs. Continue Lantus   -good control 7/17 9. A fib: Off amiodarone.   10. Acute on  chronic biventricular CHF: Daily weights. Started on [facility] 7/12. Continue Spironolactone and digoxin daily. Monitor renal status serially.  11. [name]/VF: S/p emergent cardioversion 6/24 and 6/26-->ICD turned on 7/12. To keep K> 4.0 and Mg> 2.0  12. ABLA: Improved with transfusion.  Hemoglobin 9.4 on 7/14 13. Dysphagia: Continue dysphagia 2, honey liquids with chin tuck.   -continue to push po liquids 14. LVAD implant 6/17: On coumadin and low dose ASA.   -Education ongoing with patient and wife.   -Dressing changes per VAD coordinator or trained caregiver.   LOS (Days) [place] EVALUATION WAS PERFORMED  Meredith Staggers, MD 09/15/2017 7:55 AM

## 2017-09-15 NOTE — Progress Notes (Signed)
[place] for coumadin Indication: LVAD  Allergies  Allergen Reactions  . Penicillins Hives, Itching and Other (See Comments)    Has patient had a PCN reaction causing immediate rash, facial/tongue/throat swelling, SOB or lightheadedness with hypotension:# # Yes # # Has patient had a PCN reaction causing severe rash involving mucus membranes or skin necrosis: No Has patient had a PCN reaction that required hospitalization: No Has patient had a PCN reaction occurring within the last 10 years: No If all of the above answers are "NO", then may proceed with Cephalosporin use.     Patient Measurements: Height: 5\' 11"  (180.3 cm) Weight: 212 lb (96.2 kg) IBW/kg (Calculated) : 75.3 Heparin Dosing Weight: 98.3 kg  Vital Signs: Temp: 98.7 F (37.1 C) (07/17 0431) Temp Source: Oral (07/17 0431) BP: 91/53 (07/17 0431) Pulse Rate: 68 (07/17 0431)  Labs: Recent Labs    09/13/17 0331 09/14/17 0407 09/14/17 1135 09/15/17 0354  HGB 9.5* 9.6*  --  9.5*  HCT 31.3* 30.5*  --  30.9*  PLT 232 210  --  189  LABPROT 25.2* 33.1* 25.7* 26.7*  INR 2.31 3.28 2.37 2.49  CREATININE 1.20 1.29*  --  1.15    Estimated Creatinine Clearance: 73.8 mL/min (by C-G formula based on SCr of 1.15 mg/dL).   Medical History: Past Medical History:  Diagnosis Date  . AICD (automatic cardioverter/defibrillator) present 02/05/2014   Upgrade to Medtronic biventricular ICD, serial number  [id number]   . Atrial flutter ([place]) 04/2012   s/p TEE-EPS+RFCA 04/2012  . CAD (coronary artery disease) 1062,6948 X 2    RCA-T, 70% PL (off CFX), 99% Prox LAD/90% Dist LAD, S/P TAXUS stent x 2  . CHF (congestive heart failure) ([place])   . Chronic anticoagulation   . Chronic systolic heart failure ([place])   . CKD (chronic kidney disease)   . Diabetic retinopathy ([place])   . DM type 2 (diabetes mellitus, type 2) (HCC)    insulin dependent  . HTN (hypertension)   . Hypercholesteremia    ablation  . ICD (implantable cardiac defibrillator) in place   . Ischemic cardiomyopathy March 2015   20-25% 2D   . Nephrolithiasis   . Ventricular tachycardia ([place])     Assessment: 29 yoM s/p LVAD on 6/17 on warfarin per Rx. INR therapeutic at 2.49 this morning. CBC and LDH stable. No S/Sx bleeding documented.  Goal of Therapy:  INR goal 2-2.5 Monitor platelets by anticoagulation protocol: Yes   Plan:   -Warfarin 3mg  PO x1 tonight -Daily INR, CBC, LDH  Arrie Senate, PharmD, BCPS Clinical Pharmacist [phone] Please check AMION for all [facility] numbers 09/15/2017

## 2017-09-15 NOTE — Patient Care Conference (Signed)
Inpatient RehabilitationTeam Conference and Plan of Care Update Date: 09/14/2017   Time: 2:30  PM    Patient Name: [name] [name]      Medical Record Number: [id number]  Date of Birth: November 12, 1950 Sex: Male         Room/Bed: 4W09C/4W09C-01 Payor Info: Payor: [employer] / Plan: [employer] / Product Type: *No Product type* /    Admitting Diagnosis: CHF  LVAD  Admit Date/Time:  09/10/2017  4:15 PM Admission Comments: No comment available   Primary Diagnosis:  <principal problem not specified> Principal Problem: <principal problem not specified>  Patient Active Problem List   Diagnosis Date Noted  . Debility 09/10/2017  . CHF (congestive heart failure) ([place])   . Type 2 diabetes mellitus with retinopathy, with long-term current use of insulin ([place])   . Stage 3 chronic kidney disease ([place])   . Cardiogenic shock ([place])   . Acute respiratory failure ([place])   . Tachypnea   . Leukocytosis   . Acute blood loss anemia   . Pressure injury of skin 08/27/2017  . Acute respiratory failure with hypoxemia ([place])   . Acute pulmonary edema (HCC)   . Metabolic alkalosis   . Presence of left ventricular assist device (LVAD) ([place])   . S/P TVR (tricuspid valve repair) 08/17/2017  . Goals of care, counseling/discussion   . Palliative care encounter   . Acute on chronic systolic (congestive) heart failure ([place]) 08/11/2017  . Acute on chronic combined systolic and diastolic CHF (congestive heart failure) ([place]) 11/19/2016  . Acute on chronic left and right systolic w/ diastolic heart failure, NYHA class 2 ([place]) 11/18/2016  . Hx of atrial flutter 11/18/2016  . CAD (coronary artery disease)-RCA-T, 70% PL (off CFX), 99% Prox LAD/90% Dist LAD, S/P TAXUS stent x 2 11/18/2016  . HTN (hypertension) 11/18/2016  . Diabetes mellitus with complication ([place]) 74/01/8785  . Thrombocytopenia ([place]) 11/18/2016  . Bilateral lower extremity edema 11/12/2016  . Diabetic polyneuropathy associated with  diabetes mellitus due to underlying condition ([place]) 05/29/2015  . Chronic systolic CHF (congestive heart failure) ([place]) 02/05/2014  . Medtronic biventricular ICD, serial number  BLD [id number]  02/05/2014  . CAD (coronary artery disease), native coronary artery 01/08/2014  . Chronic renal disease, stage III ([place]) 01/08/2014  . Heart failure ([place]) 12/16/2013  . Mitral regurgitation 04/17/2013  . Chronic systolic dysfunction of left ventricle 06/29/2012  . Atrial flutter ([place]) 06/03/2012  . Acute kidney injury (nontraumatic) ([place]) 05/31/2012  . Chronic anticoagulation   . Acute on chronic systolic heart failure ([place]) 05/21/2012  . Nausea with vomiting 05/18/2012  . PAF (paroxysmal atrial fibrillation) ([place]) 05/17/2012  . Orthopnea 05/17/2012  . TIA (transient ischemic attack) 05/29/2010  . CHEST PAIN 11/13/2008  . VENTRICULAR TACHYCARDIA 08/14/2008  . Automatic implantable cardioverter-defibrillator in situ 08/14/2008  . Type 2 diabetes with nephropathy ([place]) 08/11/2008  . DYSLIPIDEMIA 08/11/2008  . Cardiomyopathy, ischemic 08/11/2008  . Disorder resulting from impaired renal function 08/11/2008  . HYPERCHOLESTEROLEMIA 04/29/2006  . HYPERTENSION, BENIGN SYSTEMIC 04/29/2006  . MYOCARDIAL INFARCTION, OLD 04/29/2006  . Coronary atherosclerosis 04/29/2006  . NEPHROLITHIASIS 04/29/2006    Expected Discharge Date: Expected Discharge Date: 09/25/17  Team Members Present: Physician leading conference: Dr. Alger Simons Social Worker Present: Lennart Pall, LCSW Nurse Present: Rayetta Humphrey, RN PT Present: Canary Brim, PT OT Present: Willeen Cass, OT SLP Present: Weston Anna, SLP PPS Coordinator present : Daiva Nakayama, RN, CRRN     Current Status/Progress Goal Weekly Team Focus  Medical  debility after respiratory failure, LVAD, stage II sacral wound, pain mgt considerations  improve activity tolerance  pain control, wound care, CV mgt, LVAD mgt   Bowel/Bladder   continent bowel and  bladder, LBM 09-13-17  remain continent of bowel and bladder, maintain regular bowel pattern  assist patient with toileting needs prn   Swallow/Nutrition/ Hydration   Dys. 2 textures with honey-thick liquids, Full supervision  Mod I  use of strategies, trials of upgraded textures/liquids    ADL's   Min S sit<>stand and transfers, Mod A BADL tasks, limited by dizziness and fatigue  Supervision  pt/family education, activity tolerance, modified bathing/dressing, VAd safety within BADLs, pressure relief   Mobility   min guard/supervision for transfers and short distance gait  supervision overall  activity tolerance, family ed, d/c planning, w/c seating, LVAD management   Communication   Mod A   Supervision  RMT   Safety/Cognition/ Behavioral Observations  Mod A  Supervision  recall and problem solving    Pain    no complaints of pain  pain =0  Assess pain q shift and prn   Skin   unstageable to bottom hydrotherapy santyl wet to dry dressing, state           Rehab Goals Patient on target to meet rehab goals: Yes *See Care Plan and progress notes for long and short-term goals.     Barriers to Discharge  Current Status/Progress Possible Resolutions Date Resolved   Physician    Medical stability        see progress notes      Nursing                  PT                    OT                  SLP                SW                Discharge Planning/Teaching Needs:  Plan for pt to d/c home with wife as primary caregiver and family to provide 24/7 assistance.  Teaching to be scheduled closer to d/c date.   Team Discussion:  Multiple medical issues being followed/ managed.  Very deconditioned.  Therapies recommend change in schedule to 15/7.  Pt needs ongoing education about LVAD care. Cont b/b but needs assist with bowel hygiene due to wound.  Currently on D1, honey - will need FEES.  Mod assist with cognition.    Revisions to Treatment Plan:  tx scheduled decreased to 15/7     Continued Need for Acute Rehabilitation Level of Care: The patient requires daily medical management by a physician with specialized training in physical medicine and rehabilitation for the following conditions: Daily direction of a multidisciplinary physical rehabilitation program to ensure safe treatment while eliciting the highest outcome that is of practical value to the patient.: Yes Daily medical management of patient stability for increased activity during participation in an intensive rehabilitation regime.: Yes Daily analysis of laboratory values and/or radiology reports with any subsequent need for medication adjustment of medical intervention for : Cardiac problems;Wound care problems  Nickalus Thornsberry 09/15/2017, 5:02 PM

## 2017-09-15 NOTE — Progress Notes (Signed)
Occupational Therapy Session Note  Patient Details  Name: [name] [name] MRN: [id number] Date of Birth: [birth year]  Today's Date: 09/15/2017 OT Individual Time: 5170-0174 OT Individual Time Calculation (min): 55 min    Short Term Goals: Week 1:  OT Short Term Goal 1 (Week 1): Pt will complete toilet transfer with Min A and LRAD OT Short Term Goal 2 (Week 1): Pt will complete bathing with Min A and sit<stand level OT Short Term Goal 3 (Week 1): Pt will don shirt with supervision   Skilled Therapeutic Interventions/Progress Updates:    Treatment session with focus on activity tolerance, standing balance, and sequencing with LVAD setup.  Pt received supine in bed willing to engage in treatment session.  Pt switched LVAD from wall to battery power with increased time, however much improved sequencing this session.  Pt able to answer basic questions about LVAD correctly.  Pt demonstrating most difficulty when attempting to don LVAD battery carrier and place batteries in holster position.  Pt reports need to toilet.  Ambulated to toilet with RW with min guard navigating threshold but supervision otherwise.  Pt required assistance for hygiene post BM due to concerns of messing up dressing around pressure injury on butt.  Pt returned to EOB and switched LVAD from battery to wall power with improved sequencing and verbally processing sequence.  Pt left in sidelying in bed with all needs in reach.  Therapy Documentation Precautions:  Precautions Precautions: Sternal, Fall Precaution Comments: LVAD, sacral wound Restrictions Weight Bearing Restrictions: No General:   Vital Signs: Therapy Vitals Temp: (!) 97.5 F (36.4 C) Temp Source: Oral Pulse Rate: 89 Resp: 18 BP: 93/74 Patient Position (if appropriate): Lying Oxygen Therapy SpO2: 98 % O2 Device: Room Air Pain: Pt reports pain in butt.  Repositioned with pt reporting pain decreased but still present.  RN aware  See Function  Navigator for Current Functional Status.   Therapy/Group: Individual Therapy  Simonne Come 09/15/2017, 4:13 PM

## 2017-09-15 NOTE — Progress Notes (Signed)
Physical Therapy Wound Treatment Patient Details  Name: [name] [name] MRN: [id number] Date of Birth: 10-31-1950  Today's Date: 09/15/2017 PT Individual Time: 1110-1150 PT Individual Time Calculation (min): 40 min   Subjective  Subjective: Agreeable to therapy. Patient and Family Stated Goals: Heal wound Prior Treatments: Wet to dry dressing changes with Santyl  Pain Score:  Pt premedicated. Appeared moderately painful with debridement   Wound Assessment  Pressure Injury 08/27/17 Unstageable - Full thickness tissue loss in which the base of the ulcer is covered by slough (yellow, tan, gray, green or brown) and/or eschar (tan, brown or black) in the wound bed. 8.5 x 6 (90% darkened 5% yellow and 5% pink (Active)  Wound Image   09/10/2017 10:51 AM  Dressing Type ABD;Barrier Film (skin prep);Gauze (Comment);Moist to dry 09/15/2017 12:01 PM  Dressing Clean;Dry;Intact 09/15/2017 12:01 PM  Dressing Change Frequency Daily 09/15/2017 12:01 PM  State of Healing Non-healing 09/15/2017 12:01 PM  Site / Wound Assessment Pink;Yellow;Brown 09/15/2017 12:01 PM  % Wound base Red or Granulating 40% 09/15/2017 12:01 PM  % Wound base Yellow/Fibrinous Exudate 55% 09/15/2017 12:01 PM  % Wound base Black/Eschar 5% 09/15/2017 12:01 PM  Peri-wound Assessment Intact 09/15/2017 12:01 PM  Wound Length (cm) 9.1 cm 09/10/2017 10:51 AM  Wound Width (cm) 7.5 cm 09/10/2017 10:51 AM  Wound Depth (cm) 0.2 cm 09/10/2017 10:51 AM  Wound Surface Area (cm^2) 68.25 cm^2 09/10/2017 10:51 AM  Wound Volume (cm^3) 13.65 cm^3 09/10/2017 10:51 AM  Tunneling (cm) 0 09/10/2017 10:51 AM  Undermining (cm) 0 09/10/2017 10:51 AM  Margins Unattached edges (unapproximated) 09/15/2017 12:01 PM  Drainage Amount Moderate 09/15/2017 12:01 PM  Drainage Description Purulent 09/15/2017 12:01 PM  Treatment Debridement (Selective);Hydrotherapy (Pulse lavage);Packing (Saline gauze) 09/15/2017 12:01 PM   Santyl applied to wound bed prior to applying dressing.       Hydrotherapy Pulsed lavage therapy - wound location: R buttock Pulsed Lavage with Suction (psi): 8 psi Pulsed Lavage with Suction - Normal Saline Used: 1000 mL Pulsed Lavage Tip: Tip with splash shield Selective Debridement Selective Debridement - Location: R buttock Selective Debridement - Tools Used: Forceps;Scalpel Selective Debridement - Tissue Removed: Brown and yellow necrotic tissue   Wound Assessment and Plan  Wound Therapy - Assess/Plan/Recommendations Wound Therapy - Clinical Statement: Progressing with debridement and overall wound bed appears improved. Pt will benefit from continued hydrotherapy and selective debridement to reduce bioburden and promote wound bed healing.  Wound Therapy - Functional Problem List: Decreased tolerance for OOB and position changes Factors Delaying/Impairing Wound Healing: Diabetes Mellitus;Immobility;Multiple medical problems Hydrotherapy Plan: Debridement;Dressing change;Patient/family education;Pulsatile lavage with suction Wound Therapy - Frequency: 6X / week Wound Therapy - Follow Up Recommendations: Other (comment)(CIR) Wound Plan: See above  Wound Therapy Goals- Improve the function of patient's integumentary system by progressing the wound(s) through the phases of wound healing (inflammation - proliferation - remodeling) by: Decrease Necrotic Tissue to: 0% Decrease Necrotic Tissue - Progress: Progressing toward goal Increase Granulation Tissue to: 100% Increase Granulation Tissue - Progress: Progressing toward goal Goals/treatment plan/discharge plan were made with and agreed upon by patient/family: Yes Time For Goal Achievement: 7 days Wound Therapy - Potential for Goals: Good  Goals will be updated until maximal potential achieved or discharge criteria met.  Discharge criteria: when goals achieved, discharge from hospital, MD decision/surgical intervention, no progress towards goals, refusal/missing three consecutive treatments  without notification or medical reason.  GP     Thelma Comp 09/15/2017, 12:03 PM  Rolinda Roan, PT, DPT  Acute Rehabilitation Services Pager: [phone]

## 2017-09-15 NOTE — Progress Notes (Addendum)
Patient ID: [name] [name], male   DOB: [birth year], 67 y.o.   MRN: [id number]     Advanced Heart Failure Rounding Note  PCP-Cardiologist: No primary care provider on file.   Subjective:    Events 6/17 Underwent HM-3 implant -> Unable to close chest with RV failure and high intrathoracic pressure 6/18 Taken back to OR for evacuation of hematoma. NO RVAD needed. Left sternum open  08/19/17 Taken back to OR for sternal closure 6/22 VT => amiodarone started.  6/24 VT/VF=>urgent cardioversion. 6/25 VT  =>urgent cardioversion  6/27 Extubated 7/12 to CIR  Feeling good today. Getting stronger by the day. Denies SOB, lightheadedness or dizziness.   LVAD Interrogation HM 3: Speed: 5300 Flow: 3.8 PI: 4.5 Power: 4.0. VAD interrogated personally. Parameters stable.    Objective:   Weight Range: 212 lb (96.2 kg) Body mass index is 29.57 kg/m.   Vital Signs:   Temp:  [98.1 F (36.7 C)-98.7 F (37.1 C)] 98.7 F (37.1 C) (07/17 0431) Pulse Rate:  [48-76] 68 (07/17 0431) Resp:  [18-20] 18 (07/17 0431) BP: (77-91)/(50-68) 91/53 (07/17 0431) SpO2:  [95 %-100 %] 95 % (07/17 0431) Weight:  [212 lb (96.2 kg)] 212 lb (96.2 kg) (07/17 0500) Last BM Date: 09/14/17   MAP 70-80s  Weight change: Filed Weights   09/13/17 0500 09/14/17 0530 09/15/17 0500  Weight: 220 lb 14.4 oz (100.2 kg) 219 lb (99.3 kg) 212 lb (96.2 kg)   Intake/Output:   Intake/Output Summary (Last 24 hours) at 09/15/2017 0924 Last data filed at 09/15/2017 0900 Gross per 24 hour  Intake 840 ml  Output 1650 ml  Net -810 ml   Physical Exam   General: Well appearing this am. NAD.  HEENT: Normal. Neck: Supple, JVP 7-8 cm. Carotids OK.  Cardiac:  Mechanical heart sounds with LVAD hum present.  Lungs:  CTAB, normal effort.  Abdomen:  NT, ND, no HSM. No bruits or masses. +BS  LVAD exit site: Dressing dry and intact. No erythema or drainage. Stabilization device present and accurately applied. Driveline dressing changed  daily per sterile technique. Extremities:  Warm and dry. No cyanosis, clubbing, rash, or edema.  Neuro:  Alert & oriented x 3. Cranial nerves grossly intact. Moves all 4 extremities w/o difficulty. Affect pleasant     Telemetry   N/A  Labs    CBC Recent Labs    09/14/17 0407 09/15/17 0354  WBC 5.5 5.3  HGB 9.6* 9.5*  HCT 30.5* 30.9*  MCV 82.0 82.6  PLT 210 182   Basic Metabolic Panel Recent Labs    09/14/17 0407 09/15/17 0354  NA 137 134*  K 3.8 3.6  CL 97* 97*  CO2 29 28  GLUCOSE 105* 100*  BUN 19 18  CREATININE 1.29* 1.15  CALCIUM 8.7* 8.7*  MG 2.0 1.9   Liver Function Tests Recent Labs    09/14/17 0407 09/15/17 0354  AST 22 23  ALT 20 20  ALKPHOS 78 76  BILITOT 1.0 0.9  PROT 8.1 7.7  ALBUMIN 2.4* 2.4*   No results for input(s): LIPASE, AMYLASE in the last 72 hours. Cardiac Enzymes No results for input(s): CKTOTAL, CKMB, CKMBINDEX, TROPONINI in the last 72 hours.  BNP: BNP (last 3 results) Recent Labs    08/30/17 0000 09/05/17 2327 09/13/17 0331  BNP 114.6* 86.1 53.3    ProBNP (last 3 results) Recent Labs    11/12/16 1559  PROBNP 388.0*     D-Dimer No results for input(s): DDIMER in the  last 72 hours. Hemoglobin A1C No results for input(s): HGBA1C in the last 72 hours. Fasting Lipid Panel No results for input(s): CHOL, HDL, LDLCALC, TRIG, CHOLHDL, LDLDIRECT in the last 72 hours. Thyroid Function Tests No results for input(s): TSH, T4TOTAL, T3FREE, THYROIDAB in the last 72 hours.  Invalid input(s): FREET3  Other results:   Imaging    No results found.   Medications:     Scheduled Medications: . aspirin  81 mg Oral Daily  . bisacodyl  10 mg Oral Daily   Or  . bisacodyl  10 mg Rectal Daily  . Chlorhexidine Gluconate Cloth  6 each Topical QHS  . collagenase   Topical Daily  . famotidine  20 mg Oral BID  . insulin aspart  0-15 Units Subcutaneous TID WC  . insulin aspart  0-5 Units Subcutaneous QHS  . insulin detemir   25 Units Subcutaneous QHS  . magnesium oxide  400 mg Oral BID  . mouth rinse  15 mL Mouth Rinse BID  . multivitamin with minerals  1 tablet Oral Daily  . nystatin  5 mL Oral QID  . potassium chloride  40 mEq Oral Daily  . protein supplement shake  11 oz Oral BID BM  . sodium chloride flush  10-40 mL Intracatheter Q12H  . spironolactone  25 mg Oral Daily  . torsemide  60 mg Oral Daily  . vitamin C  500 mg Oral BID  . Warfarin - Pharmacist Dosing Inpatient   Does not apply q1800    Infusions: . lactated ringers      PRN Medications: acetaminophen, acetaminophen-codeine, alum & mag hydroxide-simeth, bisacodyl, clonazepam, diphenhydrAMINE, guaiFENesin-dextromethorphan, lactated ringers, levalbuterol, polyethylene glycol, prochlorperazine **OR** prochlorperazine **OR** prochlorperazine, RESOURCE THICKENUP CLEAR, sodium chloride flush, sodium phosphate, traZODone    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   Directly admitted with persistent low cardiac output for milrinone initiation for home.  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 Acute/Chronic systolic CHF with biventricular failure-> cardiogenic shock: Echo  08/13/2017 EF 20-25%. s/p  Medtronic BiV ICD in place. Cath 12/18 with stable 1v CAD.  s/p HM-3 implant 6/17. Unable to close chest due to high-intrathoracic pressures and RV failure. Taken back to OR 08/17/17 with evacuation of hematoma with improvement. Extubated 6/28.  Ramp ECHO 7/11 with speed increased to 5300.  - Volume status stable on exam.   - Continue torsemide 60 mg daily  - Continue spiro 25 mg daily.  2. 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 LDH 180. Ramp ECHO speed increased to 5300.   - VAD interrogated personally. Parameters stable.   - Continue warfarin + ASA 81, INR 2.49, which is more  inline with 09/13/17 level. Suspect discrepancy on lab end. Discussed dosing with PharmD personally. 3. Acute on CKD II-III:  - resolved 4. [name]/VF: Recurrent VT on 6/22.  S/P VF 6/24 emergent bedside cardioversion. S/P VT 6/26 emergent bedside cardioversion. ICD turned back on.   - No change to current plan.      5. AFL/atrial fibrillation: S/p previous ablation. He is currently in atrial fibrillation.  - Rate controlled. Amio stopped.  - On coumadin. No bleeding.  6. [name]: Recent cath with stable CAD as above: No chest pain.  7. [name]: Recent A1c 8.3 on 6/3.  - Continue current regimen. No change.  8. Anemia: Post-op.  Got 1 unit PRBCs 6/22 and 6/25. - Stable. Hgb 9.5 today.   9. Deconditioning: Severe, working with PT aggressively.   - No change to current plan.   10. Unstageable Pressure Ulcer: R and L buttock.  R buttock with necrosis noted. Continue santyl for selective debridement. Change daily. Continue air overlay. Needs to reposition every hour when in the chair. Keep R /L in bed. . May need a different chair cushion. Added hydrotherapy.   - WOC following.   I reviewed the LVAD parameters from today, and compared the results to the patient's prior recorded data.  No programming changes were made.  The LVAD is functioning within specified parameters.  The patient performs LVAD self-test daily.  LVAD interrogation was negative for any significant power changes, alarms or PI events/speed drops.  LVAD equipment check completed and is in good working order.  Back-up equipment present.   LVAD education done on emergency procedures and precautions and reviewed exit site care.   Length of Stay: [place], [place]  09/15/2017, 9:24 AM  Advanced Heart Failure Team Pager [phone] (M-F; 7a - 4p)  Please contact [facility] for night-coverage after hours (4p -7a ) and weekends on [email or web address]  Agree with the above note. MAP stable. INR therapeutic. Hgb  stable. LVAD parameters reviewed and stable. Slow progress with PT.  Wound care nurse following decubitus ulcer.   Loralie Champagne 09/15/2017

## 2017-09-15 NOTE — Progress Notes (Signed)
Physical Therapy Session Note  Patient Details  Name: [name] [name] MRN: [id number] Date of Birth: June 28, 1950  Today's Date: 09/15/2017 PT Individual Time: 0803-0905 PT Individual Time Calculation (min): 62 min   Short Term Goals: Week 1:  PT Short Term Goal 1 (Week 1): Pt will ambulate 25' w/ min assist PT Short Term Goal 2 (Week 1): Pt will maintain sternal precautions during functional mobility w/ min cues 50% of the time PT Short Term Goal 3 (Week 1): Pt will perform bed<>chair transfer w/ min assist PT Short Term Goal 4 (Week 1): Pt will demonstrate safe transition of LVAD between battery and wall power w/o cues 50% of the time  Skilled Therapeutic Interventions/Progress Updates: Pt presented sitting EOB with nsg present agreeable to therapy. Pt performed LVAD management with PTA providing cues >50% of time with increased time for management. Pt performed sit to stand from bed minA and transferred to w/c. Transported to rehab gym and performed ambulation for endurance and LE strengthening. Pt ambulated 79f x 1 and 646fx 1 with seated rest and CGA. Discussed with pt diaphragmatic breathing for recovery. Pt participated in sit to/from stand from elevated surface for BLE strengthening. Pt performed ambulatory transfer to w/c and transported back to room. Pt requested to return to bed and transferred in same manner as prior. Pt performed sit to supine with HOB slightly elevated and use of bed rail with min guard and increased time. Pt left in bed at end of session with call bell within reach and needs met.      Therapy Documentation Precautions:  Precautions Precautions: Sternal, Fall Precaution Comments: LVAD, sacral wound Restrictions Weight Bearing Restrictions: No General:   Vital Signs: Therapy Vitals Temp: (!) 97.5 F (36.4 C) Temp Source: Oral Pulse Rate: 89 Resp: 18 BP: 93/74 Patient Position (if appropriate): Lying Oxygen Therapy SpO2: 98 % O2 Device: Room  Air Pain: Pain Assessment Pain Scale: 0-10 Pain Score: 0-No pain(when on his side, partially reclined )   See Function Navigator for Current Functional Status.   Therapy/Group: Individual Therapy  Christoher Drudge  Jamyra Zweig, PTA  09/15/2017, 4:26 PM

## 2017-09-15 NOTE — Progress Notes (Signed)
Speech Language Pathology Daily Session Note  Patient Details  Name: [name] [name] MRN: [id number] Date of Birth: 1950/10/19  Today's Date: 09/15/2017 SLP Individual Time: 1305-1400 SLP Individual Time Calculation (min): 55 min  Short Term Goals: Week 1: SLP Short Term Goal 1 (Week 1): Pt will consume dys 2 textures and honey thick liquids with supervision cues for use of swallowing precautions and minimal overt s/s of aspiration.  SLP Short Term Goal 2 (Week 1): Pt will consume therapeutic trials of ice chips with supervision cues for use of swallowing precautions and minimal overt s/s of aspiration.  SLP Short Term Goal 3 (Week 1): Pt will recall new information with min cues for use of external aids.   SLP Short Term Goal 4 (Week 1): Pt will complete basic familiar tasks with min cues for functional problem solving.   SLP Short Term Goal 5 (Week 1): Pt will return demonstration of EMST trainer with mod I and for 25 repetitions a self reported effort level of <7/10.  SLP Short Term Goal 6 (Week 1): Pt will increase vocal intensity to achieve intelligibility at the sentence level with min assist.   Skilled Therapeutic Interventions:  Pt was seen for skilled ST targeting goals for speech and swallowing.  SLP facilitated the session with trials of ice chips and thin liquids via teaspoon following oral care to continue working towards diet progression.  Pt consumed trials of ice chips with no overt s/s of aspiration and demonstrated mild throat clearing x1 with thin liquids via teaspoon.  Given sensory deficits post extubation, would recommend ongoing trials of ice chips/thin liquids with SLP only.  Subjectively pt's voice sounded stronger today and pt was able to achieve voicing at the word level with min cues.  Pt could return demonstration of EMST trainer for 50 repetitions with  Mod I and a self perceived effort level of 7/10.  Pt was left in bed with bed alarm set and call bell within reach.   Continue per current plan of care.     Function:  Eating Eating   Modified Consistency Diet: Yes Eating Assist Level: Supervision or verbal cues           Cognition Comprehension Comprehension assist level: Follows basic conversation/direction with no assist  Expression   Expression assist level: Expresses basic 50 - 74% of the time/requires cueing 25 - 49% of the time. Needs to repeat parts of sentences.  Social Interaction Social Interaction assist level: Interacts appropriately 90% of the time - Needs monitoring or encouragement for participation or interaction.  Problem Solving Problem solving assist level: Solves basic 75 - 89% of the time/requires cueing 10 - 24% of the time  Memory Memory assist level: Recognizes or recalls 75 - 89% of the time/requires cueing 10 - 24% of the time    Pain Pain Assessment Pain Scale: 0-10 Pain Score: 0-No pain(when on his side, partially reclined )  Therapy/Group: Individual Therapy  Anessia [place], Selinda Orion 09/15/2017, 3:44 PM

## 2017-09-16 ENCOUNTER — Inpatient Hospital Stay (HOSPITAL_COMMUNITY): Payer: PPO | Admitting: Speech Pathology

## 2017-09-16 ENCOUNTER — Inpatient Hospital Stay (HOSPITAL_COMMUNITY): Payer: PPO | Admitting: Physical Therapy

## 2017-09-16 ENCOUNTER — Inpatient Hospital Stay (HOSPITAL_COMMUNITY): Payer: PPO | Admitting: Occupational Therapy

## 2017-09-16 LAB — COMPREHENSIVE METABOLIC PANEL
ALBUMIN: 2.4 g/dL — AB (ref 3.5–5.0)
ALK PHOS: 82 U/L (ref 38–126)
ALT: 20 U/L (ref 0–44)
AST: 23 U/L (ref 15–41)
Anion gap: 10 (ref 5–15)
BILIRUBIN TOTAL: 0.8 mg/dL (ref 0.3–1.2)
BUN: 19 mg/dL (ref 8–23)
CALCIUM: 8.8 mg/dL — AB (ref 8.9–10.3)
CO2: 28 mmol/L (ref 22–32)
CREATININE: 1.25 mg/dL — AB (ref 0.61–1.24)
Chloride: 96 mmol/L — ABNORMAL LOW (ref 98–111)
GFR calc Af Amer: >60 mL/min (ref >60)
GFR calc non Af Amer: 58 mL/min — ABNORMAL LOW (ref >60)
GLUCOSE: 152 mg/dL — AB (ref 70–99)
POTASSIUM: 3.6 mmol/L (ref 3.5–5.1)
Sodium: 134 mmol/L — ABNORMAL LOW (ref 135–145)
TOTAL PROTEIN: 7.6 g/dL (ref 6.5–8.1)

## 2017-09-16 LAB — PROTIME-INR
INR: 2.75
Prothrombin Time: 28.8 seconds — ABNORMAL HIGH (ref 11.4–15.2)

## 2017-09-16 LAB — GLUCOSE, CAPILLARY
GLUCOSE-CAPILLARY: 103 mg/dL — AB (ref 70–99)
GLUCOSE-CAPILLARY: 105 mg/dL — AB (ref 70–99)
Glucose-Capillary: 138 mg/dL — ABNORMAL HIGH (ref 70–99)
Glucose-Capillary: 156 mg/dL — ABNORMAL HIGH (ref 70–99)

## 2017-09-16 LAB — CBC
HEMATOCRIT: 32.1 % — AB (ref 39.0–52.0)
HEMOGLOBIN: 9.7 g/dL — AB (ref 13.0–17.0)
MCH: 24.7 pg — ABNORMAL LOW (ref 26.0–34.0)
MCHC: 30.2 g/dL (ref 30.0–36.0)
MCV: 81.9 fL (ref 78.0–100.0)
Platelets: 182 10*3/uL (ref 150–400)
RBC: 3.92 MIL/uL — ABNORMAL LOW (ref 4.22–5.81)
RDW: 18.4 % — AB (ref 11.5–15.5)
WBC: 5.6 10*3/uL (ref 4.0–10.5)

## 2017-09-16 LAB — MAGNESIUM: MAGNESIUM: 1.8 mg/dL (ref 1.7–2.4)

## 2017-09-16 LAB — LACTATE DEHYDROGENASE: LDH: 170 U/L (ref 98–192)

## 2017-09-16 MED ORDER — WARFARIN SODIUM 2 MG PO TABS
2.0000 mg | ORAL_TABLET | Freq: Once | ORAL | Status: AC
  Administered 2017-09-16: 2 mg via ORAL
  Filled 2017-09-16: qty 1

## 2017-09-16 MED ORDER — MAGNESIUM SULFATE 2 GM/50ML IV SOLN
2.0000 g | Freq: Once | INTRAVENOUS | Status: AC
  Administered 2017-09-16: 2 g via INTRAVENOUS
  Filled 2017-09-16: qty 50

## 2017-09-16 NOTE — Progress Notes (Signed)
Occupational Therapy Session Note  Patient Details  Name: [name] [name] MRN: [id number] Date of Birth: 04-26-1950  Today's Date: 09/16/2017 OT Individual Time: 0905-1001 OT Individual Time Calculation (min): 56 min    Short Term Goals: Week 1:  OT Short Term Goal 1 (Week 1): Pt will complete toilet transfer with Min A and LRAD OT Short Term Goal 2 (Week 1): Pt will complete bathing with Min A and sit<stand level OT Short Term Goal 3 (Week 1): Pt will don shirt with supervision   Skilled Therapeutic Interventions/Progress Updates:    Pt completed transfer from supine to sit EOB with supervision.  He then completed functional mobility and transfer to the toilet with min assist.  Mod instructional cueing for technique during sit to stand in order to adhere to sternal precautions.  He needed max assist for toilet hygiene secondary to sacral bandages and wounds, so therapist assisted with this.  He then transferred out to the bed with min assist using the RW where he completed most of his bathing in sitting with supervision.  Since peri area was washed after toileting, he did not stand and attempt again.  He was able to cross the RLE over the left knee for washing his foot and donning his sock.  He could also complete this with the LLE but not as easily secondary to buttocks pain with the weightshift.  He was able to transition back to bed in left sidelying for comfort with supervision after donning hospital gown and completing oral hygiene.  Hydro scheduled in one hour so did not attempt to [employer].  Pt left with call button and phone in reach and grandson present.    Therapy Documentation Precautions:  Precautions Precautions: Sternal, Fall Precaution Comments: LVAD, sacral wound Restrictions Weight Bearing Restrictions: No  Pain: Pain Assessment Pain Scale: Faces Faces Pain Scale: Hurts little more Pain Type: Acute pain Pain Location: Buttocks Pain Orientation:  Left;Right;Medial Pain Onset: With Activity Pain Intervention(s): Repositioned ADL: See Function Navigator for Current Functional Status.   Therapy/Group: Individual Therapy  Shantale Holtmeyer  OTR/L 09/16/2017, 10:02 AM

## 2017-09-16 NOTE — Plan of Care (Signed)
     Problem: RH SKIN INTEGRITY Goal: RH STG SKIN FREE OF INFECTION/BREAKDOWN Description With Mod. assist.  Outcome: Not Progressing; unstageable sacral wound; on hydrotherapy   Problem: RH PAIN MANAGEMENT Goal: RH STG PAIN MANAGED AT OR BELOW PT'S PAIN GOAL Description Less than3,on 1 to 10 scale.  Outcome: Progressing

## 2017-09-16 NOTE — Progress Notes (Signed)
[place] for coumadin Indication: LVAD  Allergies  Allergen Reactions  . Penicillins Hives, Itching and Other (See Comments)    Has patient had a PCN reaction causing immediate rash, facial/tongue/throat swelling, SOB or lightheadedness with hypotension:# # Yes # # Has patient had a PCN reaction causing severe rash involving mucus membranes or skin necrosis: No Has patient had a PCN reaction that required hospitalization: No Has patient had a PCN reaction occurring within the last 10 years: No If all of the above answers are "NO", then may proceed with Cephalosporin use.     Patient Measurements: Height: 5\' 11"  (180.3 cm) Weight: 213 lb (96.6 kg) IBW/kg (Calculated) : 75.3 Heparin Dosing Weight: 98.3 kg  Vital Signs: Temp: 98.2 F (36.8 C) (07/18 0615) Temp Source: Oral (07/18 0615) BP: 108/96 (07/18 1021) Pulse Rate: 63 (07/18 1021)  Labs: Recent Labs    09/14/17 0407 09/14/17 1135 09/15/17 0354 09/16/17 0414  HGB 9.6*  --  9.5* 9.7*  HCT 30.5*  --  30.9* 32.1*  PLT 210  --  189 182  LABPROT 33.1* 25.7* 26.7* 28.8*  INR 3.28 2.37 2.49 2.75  CREATININE 1.29*  --  1.15 1.25*    Estimated Creatinine Clearance: 68 mL/min (A) (by C-G formula based on SCr of 1.25 mg/dL (H)).   Medical History: Past Medical History:  Diagnosis Date  . AICD (automatic cardioverter/defibrillator) present 02/05/2014   Upgrade to [employer]onic biventricular ICD, serial number  [id number]   . Atrial flutter ([place]) 04/2012   s/p TEE-EPS+RFCA 04/2012  . CAD (coronary artery disease) 4696,2952 X 2    RCA-T, 70% PL (off CFX), 99% Prox LAD/90% Dist LAD, S/P TAXUS stent x 2  . CHF (congestive heart failure) ([place])   . Chronic anticoagulation   . Chronic systolic heart failure ([place])   . CKD (chronic kidney disease)   . Diabetic retinopathy ([place])   . DM type 2 (diabetes mellitus, type 2) ([facility])    insulin dependent  . HTN (hypertension)   .  Hypercholesteremia    ablation  . ICD (implantable cardiac defibrillator) in place   . Ischemic cardiomyopathy March 2015   20-25% 2D   . Nephrolithiasis   . Ventricular tachycardia ([place])     Assessment: 89 yoM s/p LVAD on 6/17 on warfarin per Rx. INR slightly above goal at 2.75 this morning. CBC and LDH stable. No S/Sx bleeding noted.  Goal of Therapy:  INR goal 2-2.5 Monitor platelets by anticoagulation protocol: Yes   Plan:   -Warfarin 2mg  PO x1 tonight -Daily INR, CBC, LDH  Arrie Senate, PharmD, BCPS Clinical Pharmacist [phone] Please check AMION for all [facility] Pharmacy numbers 09/16/2017

## 2017-09-16 NOTE — Progress Notes (Signed)
Physical Therapy Wound Treatment Patient Details  Name: [name] [name] MRN: [id number] Date of Birth: [birth year]  Today's Date: 09/16/2017 PT Individual Time: 1130-1220 PT Individual Time Calculation (min): 50 min   Subjective  Subjective: Agreeable to therapy. Patient and Family Stated Goals: Heal wound Prior Treatments: Wet to dry dressing changes with Santyl  Pain Score:  Pt premedicated.  Wound Assessment  Pressure Injury 08/27/17 Unstageable - Full thickness tissue loss in which the base of the ulcer is covered by slough (yellow, tan, gray, green or brown) and/or eschar (tan, brown or black) in the wound bed. 8.5 x 6 (90% darkened 5% yellow and 5% pink (Active)  Wound Image   09/10/2017 10:51 AM  Dressing Type ABD;Gauze (Comment) 09/16/2017  3:29 PM  Dressing Clean;Dry;Intact 09/16/2017  3:29 PM  Dressing Change Frequency Daily 09/16/2017  3:29 PM  State of Healing Non-healing 09/16/2017  3:29 PM  Site / Wound Assessment Pink;Yellow;Brown 09/16/2017  3:29 PM  % Wound base Red or Granulating 40% 09/16/2017  3:29 PM  % Wound base Yellow/Fibrinous Exudate 60% 09/16/2017  3:29 PM  % Wound base Black/Eschar 0% 09/16/2017  3:29 PM  Peri-wound Assessment Intact 09/16/2017  3:29 PM  Wound Length (cm) 9.1 cm 09/10/2017 10:51 AM  Wound Width (cm) 7.5 cm 09/10/2017 10:51 AM  Wound Depth (cm) 0.2 cm 09/10/2017 10:51 AM  Wound Surface Area (cm^2) 68.25 cm^2 09/10/2017 10:51 AM  Wound Volume (cm^3) 13.65 cm^3 09/10/2017 10:51 AM  Tunneling (cm) 0 09/10/2017 10:51 AM  Undermining (cm) 0 09/10/2017 10:51 AM  Margins Unattached edges (unapproximated) 09/16/2017  3:29 PM  Drainage Amount Moderate 09/16/2017  3:29 PM  Drainage Description Purulent 09/16/2017  3:29 PM  Treatment Debridement (Selective);Hydrotherapy (Pulse lavage);Packing (Saline gauze) 09/16/2017  3:29 PM   Santyl applied to wound bed prior to applying dressing.     Hydrotherapy Pulsed lavage therapy - wound location: R buttock Pulsed Lavage  with Suction (psi): 8 psi Pulsed Lavage with Suction - Normal Saline Used: 1000 mL Pulsed Lavage Tip: Tip with splash shield Selective Debridement Selective Debridement - Location: R buttock Selective Debridement - Tools Used: Forceps;Scalpel Selective Debridement - Tissue Removed: Yellow unviable tissue   Wound Assessment and Plan  Wound Therapy - Assess/Plan/Recommendations Wound Therapy - Clinical Statement: Progressing with debridement and overall wound bed appears improved. Pt will benefit from continued hydrotherapy and selective debridement to reduce bioburden and promote wound bed healing.  Wound Therapy - Functional Problem List: Decreased tolerance for OOB and position changes Factors Delaying/Impairing Wound Healing: Diabetes Mellitus;Immobility;Multiple medical problems Hydrotherapy Plan: Debridement;Dressing change;Patient/family education;Pulsatile lavage with suction Wound Therapy - Frequency: 6X / week Wound Therapy - Follow Up Recommendations: Other (comment)(CIR) Wound Plan: See above  Wound Therapy Goals- Improve the function of patient's integumentary system by progressing the wound(s) through the phases of wound healing (inflammation - proliferation - remodeling) by: Decrease Necrotic Tissue to: 0% Decrease Necrotic Tissue - Progress: Progressing toward goal Increase Granulation Tissue to: 100% Increase Granulation Tissue - Progress: Progressing toward goal Goals/treatment plan/discharge plan were made with and agreed upon by patient/family: Yes Time For Goal Achievement: 7 days Wound Therapy - Potential for Goals: Good  Goals will be updated until maximal potential achieved or discharge criteria met.  Discharge criteria: when goals achieved, discharge from hospital, MD decision/surgical intervention, no progress towards goals, refusal/missing three consecutive treatments without notification or medical reason.  GP     Thelma Comp 09/16/2017, 3:32 PM    Rolinda Roan, PT, DPT  Acute Rehabilitation Services Pager: [phone]

## 2017-09-16 NOTE — Progress Notes (Signed)
Speech Language Pathology Weekly Progress and Session Note  Patient Details  Name: [name] [name] MRN: [id number] Date of Birth: 23-Nov-1950  Beginning of progress report period: September 10, 2017 End of progress report period: September 16, 2017  Today's Date: 09/16/2017 SLP Individual Time: 0730-0830 SLP Individual Time Calculation (min): 60 min  Short Term Goals: Week 1: SLP Short Term Goal 1 (Week 1): Pt will consume dys 2 textures and honey thick liquids with supervision cues for use of swallowing precautions and minimal overt s/s of aspiration.  SLP Short Term Goal 1 - Progress (Week 1): Met SLP Short Term Goal 2 (Week 1): Pt will consume therapeutic trials of ice chips with supervision cues for use of swallowing precautions and minimal overt s/s of aspiration.  SLP Short Term Goal 2 - Progress (Week 1): Progressing toward goal SLP Short Term Goal 3 (Week 1): Pt will recall new information with min cues for use of external aids.   SLP Short Term Goal 3 - Progress (Week 1): Met SLP Short Term Goal 4 (Week 1): Pt will complete basic familiar tasks with min cues for functional problem solving.   SLP Short Term Goal 4 - Progress (Week 1): Met SLP Short Term Goal 5 (Week 1): Pt will return demonstration of EMST trainer with mod I and for 25 repetitions a self reported effort level of <7/10.  SLP Short Term Goal 5 - Progress (Week 1): Met SLP Short Term Goal 6 (Week 1): Pt will increase vocal intensity to achieve intelligibility at the sentence level with min assist.  SLP Short Term Goal 6 - Progress (Week 1): Not met    New Short Term Goals: Week 2: SLP Short Term Goal 1 (Week 2): Pt will consume dys 2 textures and honey thick liquids with Mod I use of swallowing precautions and minimal overt s/s of aspiration.  SLP Short Term Goal 2 (Week 2): Pt will consume therapeutic trials of ice chips with supervision cues for use of swallowing precautions and minimal overt s/s of aspiration.  SLP Short  Term Goal 3 (Week 2): Pt will recall new information with supervision cues for use of external aids.   SLP Short Term Goal 4 (Week 2): Pt will complete semi-complex tasks with min cues for functional problem solving.   SLP Short Term Goal 5 (Week 2): Pt will return demonstration of EMST trainer with mod I and for 75 repetitions a self reported effort level of <7/10.  SLP Short Term Goal 6 (Week 2): Pt will increase vocal intensity to achieve intelligibility at the sentence level with min assist.   Weekly Progress Updates: Pt has made good progress this reporting period and as a result he has met 4 of 6 STGs with progress made towards consumption of ice chips and readiness for instrumental study. 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 Pt continues to require skilled ST to target advanced diet textures and liquid consistencies, semi-complex problem solving, memory and speech intelligibility. Anticipate that pt will continue pt require supervision at discharge with follow up Outpatient ST services optimal.      Intensity: Minumum of 1-2 x/day, 30 to 90 minutes Frequency: 3 to 5 out of 7 days Duration/Length of Stay: 7/27 Treatment/Interventions: Cognitive remediation/compensation;Cueing hierarchy;Environmental controls;Functional tasks;Dysphagia/aspiration precaution training;Patient/family education;Internal/external aids;Speech/Language facilitation   Daily Session  Skilled Therapeutic Interventions: Skilled treatment session focused on dysphagia and cognition goals. SLP facilitated session by providing skilled observation of  pt consuming breakfast tray of dysphagia 2 with honey thick liquids and Mod I use of compensatory swallow strategies (chin tuck). Pt with wet voice x 2 with consumption when he attempted to speak with food in his mouth. Anticipate that as distractions increase, pt's ability to utilize chin tuck will decrease.  Later in session, pt consumed ice chips with wet voice and throat clear x 2 in 12 trials. SLP further facilitated session by providing Min A to replicate peg design. Pt was left supine in bed with grandson present and all needs within reach. Continue per current plan of care.    Function:   Eating Eating   Modified Consistency Diet: Yes Eating Assist Level: Supervision or verbal cues           Cognition Comprehension Comprehension assist level: Follows basic conversation/direction with no assist  Expression   Expression assist level: Expresses basic needs/ideas: With no assist;Expresses complex 90% of the time/cues < 10% of the time  Social Interaction Social Interaction assist level: Interacts appropriately with others - No medications needed.  Problem Solving Problem solving assist level: Solves basic problems with no assist;Solves complex 90% of the time/cues < 10% of the time  Memory Memory assist level: Recognizes or recalls 75 - 89% of the time/requires cueing 10 - 24% of the time   General    Pain Pain Assessment Pain Scale: Faces Faces Pain Scale: Hurts little more Pain Type: Acute pain Pain Location: Buttocks Pain Orientation: Left;Right;Medial Pain Onset: With Activity Pain Intervention(s): Repositioned  Therapy/Group: Individual Therapy  Leondre Taul 09/16/2017, 11:45 AM

## 2017-09-16 NOTE — Progress Notes (Signed)
[place] PHYSICAL MEDICINE & REHABILITATION     PROGRESS NOTE    Subjective/Complaints: Up lying in bed. Butt is sore  ROS: Patient denies fever, rash, sore throat, blurred vision, nausea, vomiting, diarrhea, cough, shortness of breath or chest pain, joint or back pain, headache, or mood change.    Objective:  No results found. Recent Labs    09/15/17 0354 09/16/17 0414  WBC 5.3 5.6  HGB 9.5* 9.7*  HCT 30.9* 32.1*  PLT 189 182   Recent Labs    09/15/17 0354 09/16/17 0414  NA 134* 134*  K 3.6 3.6  CL 97* 96*  GLUCOSE 100* 152*  BUN 18 19  CREATININE 1.15 1.25*  CALCIUM 8.7* 8.8*   CBG (last 3)  Recent Labs    09/15/17 1647 09/15/17 2137 09/16/17 0647  GLUCAP 102* 162* 138*    Wt Readings from Last 3 Encounters:  09/16/17 96.6 kg (213 lb)  09/10/17 97.8 kg (215 lb 8 oz)  07/22/17 103.9 kg (229 lb)     Intake/Output Summary (Last 24 hours) at 09/16/2017 1013 Last data filed at 09/16/2017 0834 Gross per 24 hour  Intake 720 ml  Output 1400 ml  Net -680 ml    Vital Signs: Blood pressure 92/66, pulse (!) 54, temperature 98.2 F (36.8 C), temperature source Oral, resp. rate 18, height 5\' 11"  (1.803 m), weight 96.6 kg (213 lb), SpO2 94 %. Physical Exam:  Constitutional: No distress . Vital signs reviewed. HEENT: EOMI, oral membranes moist Neck: supple Cardiovascular: RRR without murmur. No JVD    Respiratory: CTA Bilaterally without wheezes or rales. Normal effort    GI: BS +, non-tender, non-distended  Musculoskeletal:  1+ LE edema Neurological: He is oriented to person, place, and time. No cranial nerve deficit.   alert. UE 4- to 4/5 prox to distal. LE: 2+/5 HF, 3/5 KE and 4/5 ADF/PF. No sensory deficits. Fair insight and awareness. Slow to process  Skin: Skin is warm. Sacral wound still coating of fibrous tissue, some increase in pink granulation Psychiatric: pleasant and cooperative     Assessment/Plan: 1. Functional deficits secondary to  debility after CHF/LVAD/VDRF which require 2hr per day over 7 days of interdisciplinary therapy in a comprehensive inpatient rehab setting. Physiatrist is providing close team supervision and 24 hour management of active medical problems listed below. Physiatrist and rehab team continue to assess barriers to discharge/monitor patient progress toward functional and medical goals.  Function:  Bathing Bathing position   Position: Sitting EOB  Bathing parts Body parts bathed by patient: Right arm, Left arm, Abdomen, Chest, Front perineal area, Right upper leg, Left upper leg, Left lower leg, Right lower leg Body parts bathed by helper: Buttocks, Back  Bathing assist Assist Level: Touching or steadying assistance(Pt > 75%)      Upper Body Dressing/Undressing Upper body dressing   What is the patient wearing?: Hospital gown     Pull over shirt/dress - Perfomed by patient: Thread/unthread right sleeve, Thread/unthread left sleeve, Put head through opening, Pull shirt over trunk          Upper body assist Assist Level: Set up      Lower Body Dressing/Undressing Lower body dressing   What is the patient wearing?: Non-skid slipper socks     Pants- Performed by patient: Thread/unthread right pants leg, Pull pants up/down Pants- Performed by helper: Thread/unthread left pants leg   Non-skid slipper socks- Performed by helper: Don/doff right sock, Don/doff left sock  Lower body assist        Toileting Toileting Toileting activity did not occur: No continent bowel/bladder event Toileting steps completed by patient: Adjust clothing prior to toileting Toileting steps completed by helper: Performs perineal hygiene, Adjust clothing after toileting    Toileting assist Assist level: Two helpers(per Thea Alken, NT report)   Transfers Chair/bed transfer Chair/bed transfer activity did not occur: Refused Chair/bed transfer method: Stand pivot Chair/bed transfer  assist level: Touching or steadying assistance (Pt > 75%) Chair/bed transfer assistive device: Medical sales representative Ambulation activity did not occur: Refused   Max distance: 60 Assist level: Supervision or verbal cues   Wheelchair Wheelchair activity did not occur: Refused        Cognition Comprehension Comprehension assist level: Follows basic conversation/direction with no assist  Expression Expression assist level: Expresses basic needs/ideas: With no assist, Expresses complex 90% of the time/cues < 10% of the time  Social Interaction Social Interaction assist level: Interacts appropriately with others - No medications needed.  Problem Solving Problem solving assist level: Solves basic problems with no assist, Solves complex 90% of the time/cues < 10% of the time  Memory Memory assist level: Recognizes or recalls 75 - 89% of the time/requires cueing 10 - 24% of the time   Medical Problem List and Plan:  1. Functional and mobility deficits secondary to debility after congestive heart failure/LVAD/VDRF and multiple other medical issues  -poor activity tolerance -decreased to 2 hrs over 7 day intensity with better day yesterday 2. DVT Prophylaxis/Anticoagulation: Pharmaceutical: Coumadin INR therapeutic 3. Pain Management: tylenol prn.   -T#3 for hydrotherapy 4. Mood: LCSW to follow for evaluation and support.  5. Neuropsych: This patient is capable of making decisions on his own behalf.  6. Deep tissue injury bilateral buttocks/Skin/Wound Care: Air mattress overlay.   -Hydrotherapy to buttocks M->Sa. Area improving  -T#3 as above 7. Fluids/Electrolytes/Nutrition: Strict I/O. Continue nutritional supplement to promote wound healing   -I personally reviewed the patient's labs today. stable  8. T2DM: Monitor BS ac/hs. Continue Lantus   -good control 7/17 9. A fib: Off amiodarone.   10. 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 Started on [facility] 7/12. Continue  Spironolactone and digoxin daily. Monitor renal status serially.  11. [name]/VF: S/p emergent cardioversion 6/24 and 6/26-->ICD turned on 7/12. To keep K> 4.0 and Mg> 2.0  12. ABLA: Improved with transfusion.  Hemoglobin 9.4 on 7/14 13. Dysphagia: Continue dysphagia 2, honey liquids with chin tuck.   -continue to push po liquids 14. LVAD implant 6/17: On coumadin and low dose ASA.   -Education ongoing with patient and wife. feeling more comfortable with process  -Dressing changes per VAD coordinator or trained caregiver.   LOS (Days) [place] EVALUATION WAS PERFORMED  Meredith Staggers, MD 09/16/2017 10:13 AM

## 2017-09-16 NOTE — Progress Notes (Addendum)
Patient ID: [name] [name], male   DOB: 10-Apr-1950, 67 y.o.   MRN: [id number]     Advanced Heart Failure Rounding Note  PCP-Cardiologist: No primary care provider on file.   Subjective:    Events 6/17 Underwent HM-3 implant -> Unable to close chest with RV failure and high intrathoracic pressure 6/18 Taken back to OR for evacuation of hematoma. NO RVAD needed. Left sternum open  08/19/17 Taken back to OR for sternal closure 6/22 VT => amiodarone started.  6/24 VT/VF=>urgent cardioversion. 6/25 VT  =>urgent cardioversion  6/27 Extubated 7/12 to CIR  Weights and creatinine stable on PO torsemide. INR 2.75. Hemoglobin stable 9.7. LDH stable.   No fever/chills. No bleeding. Denies CP, SOB, or dizziness. Still feels weak.   LVAD Interrogation HM 3:  Speed: 5300 Flow: 3.6 PI: 3.7 Power: 5.6. VAD interrogated personally. Parameters stable.    Objective:   Weight Range: 213 lb (96.6 kg) Body mass index is 29.71 kg/m.   Vital Signs:   Temp:  [97.5 F (36.4 C)-98.2 F (36.8 C)] 98.2 F (36.8 C) (07/18 0615) Pulse Rate:  [54-89] 54 (07/18 0615) Resp:  [18] 18 (07/18 0615) BP: (92-107)/(60-74) 92/66 (07/18 0615) SpO2:  [94 %-98 %] 94 % (07/18 0615) Weight:  [213 lb (96.6 kg)] 213 lb (96.6 kg) (07/18 0615) Last BM Date: 09/15/17   MAP 70s  Weight change: Filed Weights   09/14/17 0530 09/15/17 0500 09/16/17 0615  Weight: 219 lb (99.3 kg) 212 lb (96.2 kg) 213 lb (96.6 kg)   Intake/Output:   Intake/Output Summary (Last 24 hours) at 09/16/2017 0957 Last data filed at 09/16/2017 0834 Gross per 24 hour  Intake 720 ml  Output 1400 ml  Net -680 ml   Physical Exam   General:  NAD.  HEENT: Normal. Hoarse.  Neck: Supple, JVP 7-8 cm. Carotids OK.  Cardiac:  Mechanical heart sounds with LVAD hum present.  Lungs:  CTAB, normal effort.  Abdomen:  NT, ND, no HSM. No bruits or masses. +BS  LVAD exit site: Well-healed and incorporated. Dressing dry and intact. No erythema or  drainage. Stabilization device present and accurately applied. Driveline dressing changed daily per sterile technique. Extremities:  Warm and dry. No cyanosis, clubbing, rash, or edema.  Neuro:  Alert & oriented x 3. Cranial nerves grossly intact. Moves all 4 extremities w/o difficulty. Affect pleasant    Telemetry   N/A  Labs    CBC Recent Labs    09/15/17 0354 09/16/17 0414  WBC 5.3 5.6  HGB 9.5* 9.7*  HCT 30.9* 32.1*  MCV 82.6 81.9  PLT 189 220   Basic Metabolic Panel Recent Labs    09/15/17 0354 09/16/17 0414  NA 134* 134*  K 3.6 3.6  CL 97* 96*  CO2 28 28  GLUCOSE 100* 152*  BUN 18 19  CREATININE 1.15 1.25*  CALCIUM 8.7* 8.8*  MG 1.9 1.8   Liver Function Tests Recent Labs    09/15/17 0354 09/16/17 0414  AST 23 23  ALT 20 20  ALKPHOS 76 82  BILITOT 0.9 0.8  PROT 7.7 7.6  ALBUMIN 2.4* 2.4*   No results for input(s): LIPASE, AMYLASE in the last 72 hours. Cardiac Enzymes No results for input(s): CKTOTAL, CKMB, CKMBINDEX, TROPONINI in the last 72 hours.  BNP: BNP (last 3 results) Recent Labs    08/30/17 0000 09/05/17 2327 09/13/17 0331  BNP 114.6* 86.1 53.3    ProBNP (last 3 results) Recent Labs    11/12/16 1559  PROBNP 388.0*     D-Dimer No results for input(s): DDIMER in the last 72 hours. Hemoglobin A1C No results for input(s): HGBA1C in the last 72 hours. Fasting Lipid Panel No results for input(s): CHOL, HDL, LDLCALC, TRIG, CHOLHDL, LDLDIRECT in the last 72 hours. Thyroid Function Tests No results for input(s): TSH, T4TOTAL, T3FREE, THYROIDAB in the last 72 hours.  Invalid input(s): FREET3  Other results:   Imaging    No results found.   Medications:     Scheduled Medications: . aspirin  81 mg Oral Daily  . bisacodyl  10 mg Oral Daily   Or  . bisacodyl  10 mg Rectal Daily  . Chlorhexidine Gluconate Cloth  6 each Topical QHS  . collagenase   Topical Daily  . famotidine  20 mg Oral BID  . insulin aspart  0-15  Units Subcutaneous TID WC  . insulin aspart  0-5 Units Subcutaneous QHS  . insulin detemir  25 Units Subcutaneous QHS  . magnesium oxide  400 mg Oral BID  . mouth rinse  15 mL Mouth Rinse BID  . multivitamin with minerals  1 tablet Oral Daily  . nystatin  5 mL Oral QID  . potassium chloride  40 mEq Oral Daily  . protein supplement shake  11 oz Oral BID BM  . sodium chloride flush  10-40 mL Intracatheter Q12H  . spironolactone  25 mg Oral Daily  . torsemide  60 mg Oral Daily  . vitamin C  500 mg Oral BID  . Warfarin - Pharmacist Dosing Inpatient   Does not apply q1800    Infusions: . lactated ringers      PRN Medications: acetaminophen, acetaminophen-codeine, alum & mag hydroxide-simeth, bisacodyl, clonazepam, diphenhydrAMINE, guaiFENesin-dextromethorphan, lactated ringers, levalbuterol, polyethylene glycol, prochlorperazine **OR** prochlorperazine **OR** prochlorperazine, RESOURCE THICKENUP CLEAR, sodium chloride flush, sodium phosphate, traZODone    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   Directly admitted with persistent low cardiac output for milrinone initiation for home.  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 Acute/Chronic systolic CHF with biventricular failure-> cardiogenic shock: Echo  08/13/2017 EF 20-25%. s/p  Medtronic BiV ICD in place. Cath 12/18 with stable 1v CAD.  s/p HM-3 implant 6/17 under destination therapy. Unable to close chest due to high-intrathoracic pressures and RV failure. Taken back to OR 08/17/17 with evacuation of hematoma with improvement. Extubated 6/28.  Ramp ECHO 7/11 with speed increased to 5300.  - Volume status stable.   - Continue torsemide 60 mg daily  - Continue spiro 25 mg daily.  2. 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 LDH 180. Ramp ECHO speed increased to 5300.    - VAD interrogated personally. Parameters stable.   - Continue warfarin + ASA 81, INR 2.75. Discussed dosing with PharmD personally. 3. Acute on CKD II-III:  - Resolved 4. [name]/VF: Recurrent VT on 6/22.  S/P VF 6/24 emergent bedside cardioversion. S/P VT 6/26 emergent bedside cardioversion. ICD turned back on.   - No change      5. AFL/atrial fibrillation: S/p previous ablation. He is currently in atrial fibrillation.  - Rate controlled. Amio stopped.  - On coumadin. No bleeding.  6. [name]: Recent cath with stable CAD as above: No s/s ischemia.  7. [name]: Recent A1c 8.3 on 6/3.  - Continue current regimen. No change.  8. Anemia: Post-op.  Got 1 unit PRBCs 6/22 and 6/25. - Stable. Hgb 9.7 today   9. Deconditioning: Severe, working with PT aggressively.   - No change.   10. Unstageable Pressure Ulcer: R and L buttock.  R buttock with necrosis noted. Continue santyl for selective debridement. Change daily. Continue air overlay. Needs to reposition every hour when in the chair. Keep R /L in bed. . May need a different chair cushion. Added hydrotherapy.   - WOC following. No change.   I reviewed the LVAD parameters from today, and compared the results to the patient's prior recorded data.  No programming changes were made.  The LVAD is functioning within specified parameters.  The patient performs LVAD self-test daily.  LVAD interrogation was negative for any significant power changes, alarms or PI events/speed drops.  LVAD equipment check completed and is in good working order.  Back-up equipment present.   LVAD education done on emergency procedures and precautions and reviewed exit site care.   Length of Stay: [place], NP  09/16/2017, 9:57 AM  Advanced Heart Failure Team Pager [phone] (M-F; 7a - 4p)  Please contact [facility] for night-coverage after hours (4p -7a ) and weekends on [email or web address]  Agree with the above note. MAP stable. INR therapeutic.  Hgb stable. LVAD parameters reviewed and stable.Slow progress with PT.   Loralie Champagne 09/16/2017

## 2017-09-16 NOTE — Progress Notes (Signed)
LVAD Coordinator Rounding Note  Admitted 08/11/17 for initiation of Milrinone. 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 He was evaluated [facility] /Dr Posey Pronto on 5/9 for possible heart transplant.   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 Prescott Gum under Destination Therapy criteria. Dr [name] discussed heart transplant candidacy with Dr Posey Pronto. Given size and blood type there was concern he would not make it to transplant.   Pt in bed, states he just finished working with PT, family member at bedside - sleeping.   Pt denies complaints other than his "bottom hurts". Reports he may be going home on September 24, 2017.  Vital signs: Temp:  98.2 HR: 63  Auto BP:  108/96 (72) Doppler:  Not done O2 Sat:  92 % on RA Wt: 238.....260.Marland Kitchen..225>222>221>216>215>220>219>212>213 lbs   LVAD interrogation reveals:  Speed: 5300 Flow: 3.8 Power: 4.2w PI:  4.9 Alarms: none   Drive Line: Daily dressing changes Left abd dressing dry and intact; anchors intact and accurately applied. Existing VAD dressing removed and site care performed using sterile technique. Drive line exit site cleaned with Chlora prep applicators x 2, allowed to dry, and gauze dressing with silver strip re-applied. Exit site  Remains unincorporated, @ 1-2 cm of Velour can be seen with respirations. Small amount yellow/serous drainage noted on dressing and silver strip. One suture remains intact, will leave for now since no tissue ingrowth noted.  No redness, tenderness,or  foul odor noted. Drive line anchor re-applied. Skin irritation noted under tape areas; adhesive cleaned and did not use skin prep, moved angle of dressing and anchors x 2 to allow irritated areas of skin to be open to air. Will need to continue daily dressing changes with ongoing drainage.     Labs:  LDH trend: 274......>423>397>267>306>239>245>236>223>205>216>205>197>177>188>179>170  INR trend:   1.71.......2.67>2.61>2.65>2.58>2.06>1.89>1.96>1.97>1.92>2.31>3.28>2.37>2.49>2.75  Anticoagulation Plan: -INR Goal: 2.0 - 2.5 -ASA Dose: 81 mg daily   Blood Products:  Intra op: - 08/16/17> one platelet; 2 FFP - 08/17/17> ome FFP  Post op: - 08/16/17 > one unit PCs - 08/21/17 > one unit PCs - 08/24/17 > one unit PCs  Device:  - BiV Medtronic - Therapies: off  Respiratory: extubated 08/27/17  Nitric Oxide: off 08/21/17  Gtts: - Epi 1 mcg/min - stopped 08/31/17 - Milrinone 0.125 mcg/kg/min - off 09/08/17  Adverse Events on VAD: - 08/17/17>mediastinal reexploration with evacuation of mediastinal clot - 08/19/17>sternal closure  VAD education:  1. Caregiver is changing drive line dressing independently.  2. VAD discharge education with patient and family completed on 09/09/17.  Plan/Recommendations:  1. Daily dressing changes per VAD coordinator or trained caregiver.  2. Page VAD Coordinator with any VAD equipment or drive line issues.    Zada Girt RN, VAD Coordinator 24/7 VAD Pager: [phone]

## 2017-09-16 NOTE — Progress Notes (Signed)
Nutrition Follow-up  DOCUMENTATION CODES:   Obesity unspecified  INTERVENTION:   - Continue Premier Protein (thickened to honey thick consistency) BID, each supplement provides 160 kcal and 30 grams of protein (chocolate flavor)  - Continue MVI with minerals  - Encourage adequate PO intake  NUTRITION DIAGNOSIS:   Increased nutrient needs related to wound healing as evidenced by estimated needs.  Ongoing, addressed via oral nutrition supplements  GOAL:   Patient will meet greater than or equal to 90% of their needs  Progressing  MONITOR:   PO intake, Supplement acceptance, Weight trends, Labs  REASON FOR ASSESSMENT:   Malnutrition Screening Tool    ASSESSMENT:   Patient with PMH ischemic cardiomyopathy s/p ICD, HTN, T2DM with retinopathy, CKD, CHF. He was recently admitted on 6/12 due to CHF, eventually had an LVAD placed with hematoma evacuation. Suffered cardiogenic shock requiring intubation. He is now admitted to rehab.  Pt receiving hydrotherapy for unstageable wound to buttocks.  Pt remains on Dysphagia 2 diet with honey thick liquids.  Spoke with pt at bedside. Pt resting with no complaints at time of visit.  Pt states that his meals are going well and endorses eating at least half of his breakfast this morning. Pt states that his appetite is improving.  RD offered Juven oral nutrition supplement and explained purpose. Pt declined, stating he would like to continue to receive Premier Protein supplements. Pt reports he does not like the vanilla flavor. RD to order chocolate.  Meal Completion: 70-100%  Medications reviewed and include: 10 mg Dulcolax daily, 20 mg Pepcid BID, sliding scale Novolog, 25 units Levemir daily, 400 mg magnesium oxide BID, MVI with minerals daily, 40 mEq potassium chloride daily, Premier Protein BID, 500 mg vitamin C BID, warfarin  Labs reviewed: sodium 134 (L), chloride 96 (L), creatinine 1.25 (H), hemoglobin 9.7 (L), HCT 32.1  (L) CBG's: 138, 162, 102, 159 x 24 hours  UOP: 1800 ml x 24 hours I/O's: -7.5 L since admission  Diet Order:   Diet Order           DIET DYS 2 Room service appropriate? Yes; Fluid consistency: Honey Thick  Diet effective now          EDUCATION NEEDS:   No education needs have been identified at this time  Skin:  Skin Assessment: Skin Integrity Issues: Skin Integrity Issues:: Unstageable Unstageable: rt buttocks Incisions: closed to chest, abdomen  Last BM:  09/15/17 large type 4  Height:   Ht Readings from Last 1 Encounters:  09/10/17 5\' 11"  (1.803 m)    Weight:   Wt Readings from Last 1 Encounters:  09/16/17 213 lb (96.6 kg)    Ideal Body Weight:  78.2 kg  BMI:  Body mass index is 29.71 kg/m.  Estimated Nutritional Needs:   Kcal:  2200-2350 kcal/day  Protein:  115-130 grams/day  Fluid:  >2L/day    Gaynell Face, MS, RD, LDN Pager: [phone] Weekend/After Hours: [phone]

## 2017-09-16 NOTE — Progress Notes (Signed)
Physical Therapy Session Note  Patient Details  Name: [name] [name] MRN: [id number] Date of Birth: 11/26/1950  Today's Date: 09/16/2017 PT Individual Time: 1350-1450 PT Individual Time Calculation (min): 60 min   Short Term Goals: Week 1:  PT Short Term Goal 1 (Week 1): Pt will ambulate 25' w/ min assist PT Short Term Goal 2 (Week 1): Pt will maintain sternal precautions during functional mobility w/ min cues 50% of the time PT Short Term Goal 3 (Week 1): Pt will perform bed<>chair transfer w/ min assist PT Short Term Goal 4 (Week 1): Pt will demonstrate safe transition of LVAD between battery and wall power w/o cues 50% of the time  Skilled Therapeutic Interventions/Progress Updates:   Pt requires encouragement to participate in therapy session and get OOB but eventually agreeable. Pt able to come to EOB with supervision and extra time and performed functional transfers from bed and on/off toilet with RW with supervision to steadying assist with cues for adherence to sternal precautions. Pt able to ambulate in and out of bathroom with close supervision to steadying assist with RW and assist for lines management. Family assisted with LVAD management when disconnecting and reconnecting to wall/battery power. Total assist for transport down to therapy gym for energy conservation. Engaged in standing balance and tolerance activities while kicking ball with alternating LE's and ball taps with seated break between trials. Gait with RW for functional mobility and overall strengthening/endurance x 60' with close supervision to steadying assist. Transferred back to bed at end of session with family at bedside.   Therapy Documentation Precautions:  Precautions Precautions: Sternal, Fall Precaution Comments: LVAD, sacral wound Restrictions Weight Bearing Restrictions: No  Pain:  c/o pain on bottom after wound care - premedicated.   See Function Navigator for Current Functional  Status.   Therapy/Group: Individual Therapy  Canary Brim Ivory Broad, PT, DPT  09/16/2017, 3:02 PM

## 2017-09-17 ENCOUNTER — Inpatient Hospital Stay (HOSPITAL_COMMUNITY): Payer: PPO | Admitting: Physical Therapy

## 2017-09-17 ENCOUNTER — Inpatient Hospital Stay (HOSPITAL_COMMUNITY): Payer: PPO

## 2017-09-17 ENCOUNTER — Inpatient Hospital Stay (HOSPITAL_COMMUNITY): Payer: PPO | Admitting: Speech Pathology

## 2017-09-17 ENCOUNTER — Ambulatory Visit (HOSPITAL_COMMUNITY): Payer: PPO

## 2017-09-17 LAB — CBC
HEMATOCRIT: 33.3 % — AB (ref 39.0–52.0)
Hemoglobin: 10.2 g/dL — ABNORMAL LOW (ref 13.0–17.0)
MCH: 25.1 pg — AB (ref 26.0–34.0)
MCHC: 30.6 g/dL (ref 30.0–36.0)
MCV: 82 fL (ref 78.0–100.0)
PLATELETS: 193 10*3/uL (ref 150–400)
RBC: 4.06 MIL/uL — ABNORMAL LOW (ref 4.22–5.81)
RDW: 18 % — ABNORMAL HIGH (ref 11.5–15.5)
WBC: 5 10*3/uL (ref 4.0–10.5)

## 2017-09-17 LAB — COMPREHENSIVE METABOLIC PANEL
ALT: 20 U/L (ref 0–44)
AST: 23 U/L (ref 15–41)
Albumin: 2.5 g/dL — ABNORMAL LOW (ref 3.5–5.0)
Alkaline Phosphatase: 84 U/L (ref 38–126)
Anion gap: 9 (ref 5–15)
BILIRUBIN TOTAL: 1.2 mg/dL (ref 0.3–1.2)
BUN: 18 mg/dL (ref 8–23)
CO2: 28 mmol/L (ref 22–32)
Calcium: 9 mg/dL (ref 8.9–10.3)
Chloride: 99 mmol/L (ref 98–111)
Creatinine, Ser: 1.22 mg/dL (ref 0.61–1.24)
GFR, EST NON AFRICAN AMERICAN: 60 mL/min — AB (ref >60)
Glucose, Bld: 100 mg/dL — ABNORMAL HIGH (ref 70–99)
Potassium: 3.7 mmol/L (ref 3.5–5.1)
Sodium: 136 mmol/L (ref 135–145)
TOTAL PROTEIN: 7.9 g/dL (ref 6.5–8.1)

## 2017-09-17 LAB — GLUCOSE, CAPILLARY
GLUCOSE-CAPILLARY: 105 mg/dL — AB (ref 70–99)
Glucose-Capillary: 138 mg/dL — ABNORMAL HIGH (ref 70–99)
Glucose-Capillary: 182 mg/dL — ABNORMAL HIGH (ref 70–99)
Glucose-Capillary: 131 mg/dL — ABNORMAL HIGH (ref 70–99)

## 2017-09-17 LAB — MAGNESIUM: MAGNESIUM: 2 mg/dL (ref 1.7–2.4)

## 2017-09-17 LAB — LACTATE DEHYDROGENASE: LDH: 187 U/L (ref 98–192)

## 2017-09-17 LAB — PROTIME-INR
INR: 2.59
Prothrombin Time: 27.5 seconds — ABNORMAL HIGH (ref 11.4–15.2)

## 2017-09-17 MED ORDER — WARFARIN SODIUM 2 MG PO TABS
2.0000 mg | ORAL_TABLET | Freq: Once | ORAL | Status: AC
  Administered 2017-09-17: 2 mg via ORAL
  Filled 2017-09-17: qty 1

## 2017-09-17 NOTE — Progress Notes (Addendum)
Patient ID: [name] [name], male   DOB: 1950/04/15, 67 y.o.   MRN: [id number]     Advanced Heart Failure Rounding Note  PCP-Cardiologist: No primary care provider on file.   Subjective:    Events 6/17 Underwent HM-3 implant -> Unable to close chest with RV failure and high intrathoracic pressure 6/18 Taken back to OR for evacuation of hematoma. NO RVAD needed. Left sternum open  08/19/17 Taken back to OR for sternal closure 6/22 VT => amiodarone started.  6/24 VT/VF=>urgent cardioversion. 6/25 VT  =>urgent cardioversion  6/27 Extubated 7/12 to CIR  Weights and creatinine stable on po torsemide.   INR 2.59. Hgb 10.2.  Denies SOB, CP, or dizziness. Weak, but getting stronger. Having some irritation from adhesive dressings.   LVAD Interrogation HM 3: Speed: 5300 Flow: 3.7 PI: 5.9 Power: 4.0. VAD interrogated personally. Parameters stable.    Objective:   Weight Range: 215 lb (97.5 kg) Body mass index is 29.99 kg/m.   Vital Signs:   Temp:  [97.5 F (36.4 C)-98.5 F (36.9 C)] 97.5 F (36.4 C) (07/19 0508) Pulse Rate:  [29-87] 78 (07/19 0508) Resp:  [16-17] 16 (07/19 0508) BP: (85-108)/(70-96) 106/93 (07/19 0508) SpO2:  [92 %-100 %] 99 % (07/19 0508) Weight:  [215 lb (97.5 kg)] 215 lb (97.5 kg) (07/19 0440) Last BM Date: 09/16/17   MAP 80s  Weight change: Filed Weights   09/15/17 0500 09/16/17 0615 09/17/17 0440  Weight: 212 lb (96.2 kg) 213 lb (96.6 kg) 215 lb (97.5 kg)   Intake/Output:   Intake/Output Summary (Last 24 hours) at 09/17/2017 1002 Last data filed at 09/17/2017 0916 Gross per 24 hour  Intake 230 ml  Output 1150 ml  Net -920 ml   Physical Exam   General: Well appearing this am. NAD.  HEENT: Normal. Neck: Supple, JVP 7-8 cm. Carotids OK.  Cardiac:  Mechanical heart sounds with LVAD hum present.  Lungs:  CTAB, normal effort.  Abdomen:  NT, ND, no HSM. No bruits or masses. +BS  LVAD exit site: Dressing dry and intact. No erythema or drainage.  Stabilization device present and accurately applied. Driveline dressing changed daily per sterile technique. Extremities:  Warm and dry. No cyanosis, clubbing, rash, or edema.  Neuro:  Alert & oriented x 3. Cranial nerves grossly intact. Moves all 4 extremities w/o difficulty. Affect pleasant     Telemetry   N/A.   Labs    CBC Recent Labs    09/16/17 0414 09/17/17 0437  WBC 5.6 5.0  HGB 9.7* 10.2*  HCT 32.1* 33.3*  MCV 81.9 82.0  PLT 182 509   Basic Metabolic Panel Recent Labs    09/16/17 0414 09/17/17 0437  NA 134* 136  K 3.6 3.7  CL 96* 99  CO2 28 28  GLUCOSE 152* 100*  BUN 19 18  CREATININE 1.25* 1.22  CALCIUM 8.8* 9.0  MG 1.8 2.0   Liver Function Tests Recent Labs    09/16/17 0414 09/17/17 0437  AST 23 23  ALT 20 20  ALKPHOS 82 84  BILITOT 0.8 1.2  PROT 7.6 7.9  ALBUMIN 2.4* 2.5*   No results for input(s): LIPASE, AMYLASE in the last 72 hours. Cardiac Enzymes No results for input(s): CKTOTAL, CKMB, CKMBINDEX, TROPONINI in the last 72 hours.  BNP: BNP (last 3 results) Recent Labs    08/30/17 0000 09/05/17 2327 09/13/17 0331  BNP 114.6* 86.1 53.3    ProBNP (last 3 results) Recent Labs    11/12/16 1559  PROBNP 388.0*     D-Dimer No results for input(s): DDIMER in the last 72 hours. Hemoglobin A1C No results for input(s): HGBA1C in the last 72 hours. Fasting Lipid Panel No results for input(s): CHOL, HDL, LDLCALC, TRIG, CHOLHDL, LDLDIRECT in the last 72 hours. Thyroid Function Tests No results for input(s): TSH, T4TOTAL, T3FREE, THYROIDAB in the last 72 hours.  Invalid input(s): FREET3  Other results:   Imaging    No results found.   Medications:     Scheduled Medications: . aspirin  81 mg Oral Daily  . bisacodyl  10 mg Oral Daily   Or  . bisacodyl  10 mg Rectal Daily  . Chlorhexidine Gluconate Cloth  6 each Topical QHS  . collagenase   Topical Daily  . famotidine  20 mg Oral BID  . insulin aspart  0-15 Units  Subcutaneous TID WC  . insulin aspart  0-5 Units Subcutaneous QHS  . insulin detemir  25 Units Subcutaneous QHS  . magnesium oxide  400 mg Oral BID  . mouth rinse  15 mL Mouth Rinse BID  . multivitamin with minerals  1 tablet Oral Daily  . nystatin  5 mL Oral QID  . potassium chloride  40 mEq Oral Daily  . protein supplement shake  11 oz Oral BID BM  . sodium chloride flush  10-40 mL Intracatheter Q12H  . spironolactone  25 mg Oral Daily  . torsemide  60 mg Oral Daily  . vitamin C  500 mg Oral BID  . warfarin  2 mg Oral ONCE-1800  . Warfarin - Pharmacist Dosing Inpatient   Does not apply q1800    Infusions: . lactated ringers      PRN Medications: acetaminophen, acetaminophen-codeine, alum & mag hydroxide-simeth, bisacodyl, clonazepam, diphenhydrAMINE, guaiFENesin-dextromethorphan, lactated ringers, levalbuterol, polyethylene glycol, prochlorperazine **OR** prochlorperazine **OR** prochlorperazine, RESOURCE THICKENUP CLEAR, sodium chloride flush, sodium phosphate, traZODone    Patient Profile   [name] [name] is a 67 y.o. 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   Directly admitted with persistent low cardiac output for milrinone initiation for home.  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 Acute/Chronic systolic CHF with biventricular failure-> cardiogenic shock: Echo  08/13/2017 EF 20-25%. s/p  Medtronic BiV ICD in place. Cath 12/18 with stable 1v CAD.  s/p HM-3 implant 6/17 under destination therapy. Unable to close chest due to high-intrathoracic pressures and RV failure. Taken back to OR 08/17/17 with evacuation of hematoma with improvement. Extubated 6/28.  Ramp ECHO 7/11 with speed increased to 5300.  - Volume status stable.    - Continue torsemide 60 mg daily  - Continue spiro 25 mg daily.  2. 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 LDH 180. Ramp  ECHO speed increased to 5300.   - VAD interrogated personally. Parameters stable.   - Continue warfarin + ASA 81, INR 2.59. Discussed dosing with PharmD personally.  3. Acute on CKD II-III:  - Resolved.  4. [name]/VF: Recurrent VT on 6/22.  S/P VF 6/24 emergent bedside cardioversion. S/P VT 6/26 emergent bedside cardioversion. ICD turned back on.   - No change to current plan.   5. AFL/atrial fibrillation: S/p previous ablation. He is currently in atrial fibrillation.  - Rate controlled. Amio stopped.  - On coumadin. No bleeding.   6. CAD s/p PCI of  RCA and LAD: Recent cath with stable CAD as above:  - No s/s of ischemia.    7. [name]: Recent A1c 8.3 on 6/3.  - Continue current regimen. No change.   8. Anemia: Post-op.  Got 1 unit PRBCs 6/22 and 6/25. - Stable. Hgb 10.2 today.  9. Deconditioning: Severe, working with PT aggressively.   - No change to current plan.   10. Unstageable Pressure Ulcer: R and L buttock.  R buttock with necrosis noted. Continue santyl for selective debridement. Change daily. Continue air overlay. Needs to reposition every hour when in the chair. Keep R /L in bed. . May need a different chair cushion. Added hydrotherapy.   - WOC following. No change.   I reviewed the LVAD parameters from today, and compared the results to the patient's prior recorded data.  No programming changes were made.  The LVAD is functioning within specified parameters.  The patient performs LVAD self-test daily.  LVAD interrogation was negative for any significant power changes, alarms or PI events/speed drops.  LVAD equipment check completed and is in good working order.  Back-up equipment present.   LVAD education done on emergency procedures and precautions and reviewed exit site care.   Length of Stay: [place], [place]  09/17/2017, 10:02 AM  Advanced Heart Failure Team Pager [phone] (M-F; 7a - 4p)  Please contact [facility] for night-coverage after hours (4p -7a )  and weekends on [email or web address]  Agree with the above note. MAP stable. INR therapeutic. Hgb stable. LVAD parameters reviewed and stable.Ongoing progress with PT.  Loralie Champagne 09/17/2017

## 2017-09-17 NOTE — Progress Notes (Signed)
Occupational Therapy Session Note  Patient Details  Name: [name] [name] MRN: [id number] Date of Birth: Mar 25, 1950  Today's Date: 09/17/2017 OT Individual Time: 1300-1415 OT Individual Time Calculation (min): 75 min    Short Term Goals: Week 1:  OT Short Term Goal 1 (Week 1): Pt will complete toilet transfer with Min A and LRAD OT Short Term Goal 2 (Week 1): Pt will complete bathing with Min A and sit<stand level OT Short Term Goal 3 (Week 1): Pt will don shirt with supervision   Skilled Therapeutic Interventions/Progress Updates:    1:1. Pt with 6/10 pain in buttocks and willing to complete therapy to tolerance. Pt compeletes supine>sitting EOB?w/c via stand pivot with touching A and Vc for sternal precaution adherence. Pt completes bathing at sit to stand level with A to wash butocks only d/t bandages. Pt able to cross B legs to wash feet/don socks. Pt requires increased time for rest breaks d/t endurance. Pt dons gown as this is his last session of the day. Pt able to switch to battery mode with min Vc for cord management. Pt completes standing pipe tree activity to build figure with PVC pipe with supervision for static standing balance and VC for locating correct pieces. Exited session with pt seated in w/c, call light in reach and all needs met  Therapy Documentation Precautions:  Precautions Precautions: Sternal, Fall Precaution Comments: LVAD, sacral wound Restrictions Weight Bearing Restrictions: No  See Function Navigator for Current Functional Status.   Therapy/Group: Individual Therapy  Tonny Branch 09/17/2017, 2:16 PM

## 2017-09-17 NOTE — Progress Notes (Signed)
LVAD Coordinator Rounding Note  Admitted 08/11/17 for initiation of Milrinone. 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 He was evaluated [facility] /Dr Posey Pronto on 5/9 for possible heart transplant.   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 Prescott Gum under Destination Therapy criteria. Dr [name] discussed heart transplant candidacy with Dr Posey Pronto. Given size and blood type there was concern he would not make it to transplant.   Pt sitting on the side of the bed playing cards with Speech Therapy.  Pt denies complaints other than his "bottom hurts". Reports he may be going home on September 24, 2017.  Vital signs: Temp:  97.5 HR: 78  Auto BP:  106/93 (99) Doppler:  Not done O2 Sat:  99 % on RA Wt: 238.....260.Marland Kitchen..225>222>221>216>215>220>219>212>213>215 lbs   LVAD interrogation reveals:  Speed: 5300 Flow: 3.7 Power: 3.6w PI:  5.1 Alarms: none   Drive Line: Daily dressing changes Left abd dressing dry and intact; anchors intact and accurately applied. Wife with change the dressing today and this weekend.  Labs:  LDH trend: 274......245>236>223>205>216>205>197>177>188>179>170>187  INR trend:  1.71.......2.58>2.06>1.89>1.96>1.97>1.92>2.31>3.28>2.37>2.49>2.75>2.59  Anticoagulation Plan: -INR Goal: 2.0 - 2.5 -ASA Dose: 81 mg daily   Blood Products:  Intra op: - 08/16/17> one platelet; 2 FFP - 08/17/17> ome FFP  Post op: - 08/16/17 > one unit PCs - 08/21/17 > one unit PCs - 08/24/17 > one unit PCs  Device:  - BiV Medtronic - Therapies: off  Respiratory: extubated 08/27/17  Nitric Oxide: off 08/21/17  Gtts: - Epi 1 mcg/min - stopped 08/31/17 - Milrinone 0.125 mcg/kg/min - off 09/08/17  Adverse Events on VAD: - 08/17/17>mediastinal reexploration with evacuation of mediastinal clot - 08/19/17>sternal closure  VAD education:  1. Caregiver is changing drive line dressing independently.  2. VAD discharge education with patient and family completed on  09/09/17.  Plan/Recommendations:  1. Daily dressing changes per VAD coordinator or trained caregiver.  2. Page VAD Coordinator with any VAD equipment or drive line issues.    Tanda Rockers RN, VAD Coordinator 24/7 VAD Pager: [phone]

## 2017-09-17 NOTE — Progress Notes (Signed)
Physical Therapy Wound Treatment Patient Details  Name: [name] [name] MRN: [id number] Date of Birth: [birth year]  Today's Date: 09/17/2017 PT Individual Time: 1103-1127 PT Individual Time Calculation (min): 24 min   Subjective  Subjective: Pt reports he may get to leave next Friday Patient and Family Stated Goals: Heal wound Prior Treatments: Wet to dry dressing changes with Santyl  Pain Score: Pain Score: 0-No pain  Wound Assessment  Pressure Injury 08/27/17 Unstageable - Full thickness tissue loss in which the base of the ulcer is covered by slough (yellow, tan, gray, green or brown) and/or eschar (tan, brown or black) in the wound bed. 8.5 x 6 (90% darkened 5% yellow and 5% pink (Active)  Dressing Type Gauze (Comment);Foam;Barrier Film (skin prep) 09/17/2017 12:00 PM  Dressing Clean;Dry;Intact;Changed 09/17/2017 12:00 PM  Dressing Change Frequency Daily 09/17/2017 12:00 PM  State of Healing Non-healing 09/17/2017 12:00 PM  Site / Wound Assessment Pink;Yellow 09/17/2017 12:00 PM  % Wound base Red or Granulating 40% 09/17/2017 12:00 PM  % Wound base Yellow/Fibrinous Exudate 60% 09/17/2017 12:00 PM  % Wound base Black/Eschar 0% 09/17/2017 12:00 PM  % Wound base Other/Granulation Tissue (Comment) 0% 09/17/2017 12:00 PM  Peri-wound Assessment Intact 09/17/2017 12:00 PM  Wound Length (cm) 9.1 cm 09/10/2017 10:51 AM  Wound Width (cm) 7.5 cm 09/10/2017 10:51 AM  Wound Depth (cm) 0.2 cm 09/10/2017 10:51 AM  Wound Surface Area (cm^2) 68.25 cm^2 09/10/2017 10:51 AM  Wound Volume (cm^3) 13.65 cm^3 09/10/2017 10:51 AM  Tunneling (cm) 0 09/10/2017 10:51 AM  Undermining (cm) 0 09/10/2017 10:51 AM  Margins Unattached edges (unapproximated) 09/17/2017 12:00 PM  Drainage Amount Moderate 09/17/2017 12:00 PM  Drainage Description Purulent 09/17/2017 12:00 PM  Treatment Debridement (Selective);Hydrotherapy (Pulse lavage);Packing (Saline gauze) 09/17/2017 12:00 PM   Santyl applied to wound bed prior to applying  dressing.    Hydrotherapy Pulsed lavage therapy - wound location: R buttock Pulsed Lavage with Suction (psi): 8 psi Pulsed Lavage with Suction - Normal Saline Used: 1000 mL Pulsed Lavage Tip: Tip with splash shield Selective Debridement Selective Debridement - Location: R buttock Selective Debridement - Tools Used: Forceps;Scissors Selective Debridement - Tissue Removed: yellow necrotic tissue   Wound Assessment and Plan  Wound Therapy - Assess/Plan/Recommendations Wound Therapy - Clinical Statement: Slow progress with removal of necrotic tissue. Continue to perform hydrotherapy for removal of necrotic tissue and decrease of bioburden. Wound Therapy - Functional Problem List: Decreased tolerance for OOB and position changes Factors Delaying/Impairing Wound Healing: Diabetes Mellitus;Immobility;Multiple medical problems Hydrotherapy Plan: Debridement;Dressing change;Patient/family education;Pulsatile lavage with suction Wound Therapy - Frequency: 6X / week Wound Therapy - Follow Up Recommendations: Other (comment)(CIR) Wound Plan: See above  Wound Therapy Goals- Improve the function of patient's integumentary system by progressing the wound(s) through the phases of wound healing (inflammation - proliferation - remodeling) by: Decrease Necrotic Tissue to: 40 Decrease Necrotic Tissue - Progress: Goal set today Increase Granulation Tissue to: 60 Increase Granulation Tissue - Progress: Goal set today Goals/treatment plan/discharge plan were made with and agreed upon by patient/family: Yes Time For Goal Achievement: 7 days Wound Therapy - Potential for Goals: Good  Goals will be updated until maximal potential achieved or discharge criteria met.  Discharge criteria: when goals achieved, discharge from hospital, MD decision/surgical intervention, no progress towards goals, refusal/missing three consecutive treatments without notification or medical reason.  GP     Shary Decamp  [place] 09/17/2017, 12:22 PM [employer] [place]

## 2017-09-17 NOTE — Progress Notes (Signed)
Physical Therapy Weekly Progress Note  Patient Details  Name: DUAYNE BRIDEAU MRN: [id number] Date of Birth: April 15, 1950  Beginning of progress report period: September 11, 2017 End of progress report period: September 17, 2017  Today's Date: 09/17/2017 PT Individual Time: 0828-0913 PT Individual Time Calculation (min): 45 min   Pt performs bed mobility and transfers throughout session with supervision.  Gait with RW with supervision 100', 75' x 3.  Stair negotiation with 1 handrail to simulate home entry 2 steps x 2 with min guard.  Standing balance and tolerance with horseshoe toss game with pt able to stand 3 x 5 minutes with supervision with 1 UE support.  Pt's family able to perform safe LVAD management and changing battery <> wall power.  Pt left in w/c with needs at hand.  Patient has met 3 of 4 short term goals.  Pt progressing well with mobility and is at supervision level for transfers and short distance gait with RW.  Pt continues to require assist and cues for LVAD management.  Patient continues to demonstrate the following deficits muscle weakness, decreased cardiorespiratoy endurance, decreased problem solving, decreased memory and delayed processing and decreased balance strategies and therefore will continue to benefit from skilled PT intervention to increase functional independence with mobility.  Patient progressing toward long term goals..  Continue plan of care.  PT Short Term Goals Week 1:  PT Short Term Goal 1 (Week 1): Pt will ambulate 25' w/ min assist PT Short Term Goal 1 - Progress (Week 1): Met PT Short Term Goal 2 (Week 1): Pt will maintain sternal precautions during functional mobility w/ min cues 50% of the time PT Short Term Goal 2 - Progress (Week 1): Met PT Short Term Goal 3 (Week 1): Pt will perform bed<>chair transfer w/ min assist PT Short Term Goal 3 - Progress (Week 1): Met PT Short Term Goal 4 (Week 1): Pt will demonstrate safe transition of LVAD between  battery and wall power w/o cues 50% of the time PT Short Term Goal 4 - Progress (Week 1): Progressing toward goal Week 2:  PT Short Term Goal 1 (Week 2): = LTG  Skilled Therapeutic Interventions/Progress Updates:  Visual/perceptual remediation/compensation;Stair training;Pain management;Disease management/prevention;Ambulation/gait training;Balance/vestibular training;DME/adaptive equipment instruction;Patient/family education;Therapeutic Activities;Wheelchair propulsion/positioning;Psychosocial support;Therapeutic Exercise;Cognitive remediation/compensation;Community reintegration;Functional mobility training;Skin care/wound management;UE/LE Strength taining/ROM;UE/LE Coordination activities;Splinting/orthotics;Discharge planning;Neuromuscular re-education   Therapy Documentation Precautions:  Precautions Precautions: Sternal, Fall Precaution Comments: LVAD, sacral wound Restrictions Weight Bearing Restrictions: No Pain:  pt c/o pain in buttocks with sitting, eases with position changes. Pt requests to wait until closer to hydrotherapy to receive pain meds  Therapy/Group: Individual Therapy  Tarius Stangelo 09/17/2017, 9:14 AM

## 2017-09-17 NOTE — Progress Notes (Signed)
[place] PHYSICAL MEDICINE & REHABILITATION     PROGRESS NOTE    Subjective/Complaints: Up working with therapies. No new complaints  ROS: Patient denies fever, rash, sore throat, blurred vision, nausea, vomiting, diarrhea, cough, shortness of breath or chest pain, joint or back pain, headache, or mood change.     Objective:  No results found. Recent Labs    09/16/17 0414 09/17/17 0437  WBC 5.6 5.0  HGB 9.7* 10.2*  HCT 32.1* 33.3*  PLT 182 193   Recent Labs    09/16/17 0414 09/17/17 0437  NA 134* 136  K 3.6 3.7  CL 96* 99  GLUCOSE 152* 100*  BUN 19 18  CREATININE 1.25* 1.22  CALCIUM 8.8* 9.0   CBG (last 3)  Recent Labs    09/16/17 1748 09/16/17 2108 09/17/17 0656  GLUCAP 156* 105* 105*    Wt Readings from Last 3 Encounters:  09/17/17 97.5 kg (215 lb)  09/10/17 97.8 kg (215 lb 8 oz)  07/22/17 103.9 kg (229 lb)     Intake/Output Summary (Last 24 hours) at 09/17/2017 1012 Last data filed at 09/17/2017 0916 Gross per 24 hour  Intake 230 ml  Output 1150 ml  Net -920 ml    Vital Signs: Blood pressure (!) 106/93, pulse 78, temperature (!) 97.5 F (36.4 C), temperature source Axillary, resp. rate 16, height 5\' 11"  (1.803 m), weight 97.5 kg (215 lb), SpO2 99 %. Physical Exam:  Constitutional: No distress . Vital signs reviewed. HEENT: EOMI, oral membranes moist Neck: supple Cardiovascular: Hum. No JVD    Respiratory: CTA Bilaterally without wheezes or rales. Normal effort    GI: BS +, non-tender, non-distended   Musculoskeletal:  1+ LE edema Neurological: He is oriented to person, place, and time. No cranial nerve deficit.   alert. UE 4- to 4/5 prox to distal. LE: 2+/5 HF, 3/5 KE and 4/5 ADF/PF. No sensory deficits. Fair insight and awareness. Slow to process  Skin: Skin is warm. Sacral wound with coating of fibrous tissue, some increase in pink granulation apparent Psychiatric: pleasant and cooperative     Assessment/Plan: 1. Functional deficits  secondary to debility after CHF/LVAD/VDRF which require 2hr per day over 7 days of interdisciplinary therapy in a comprehensive inpatient rehab setting. Physiatrist is providing close team supervision and 24 hour management of active medical problems listed below. Physiatrist and rehab team continue to assess barriers to discharge/monitor patient progress toward functional and medical goals.  Function:  Bathing Bathing position   Position: Sitting EOB  Bathing parts Body parts bathed by patient: Right arm, Left arm, Abdomen, Chest, Front perineal area, Right upper leg, Left upper leg, Left lower leg, Right lower leg Body parts bathed by helper: Buttocks, Back  Bathing assist Assist Level: Touching or steadying assistance(Pt > 75%)      Upper Body Dressing/Undressing Upper body dressing   What is the patient wearing?: Hospital gown     Pull over shirt/dress - Perfomed by patient: Thread/unthread right sleeve, Thread/unthread left sleeve, Put head through opening, Pull shirt over trunk          Upper body assist Assist Level: Set up      Lower Body Dressing/Undressing Lower body dressing   What is the patient wearing?: Non-skid slipper socks     Pants- Performed by patient: Thread/unthread right pants leg, Pull pants up/down Pants- Performed by helper: Thread/unthread left pants leg   Non-skid slipper socks- Performed by helper: Don/doff right sock, Don/doff left sock  Lower body assist        Toileting Toileting Toileting activity did not occur: No continent bowel/bladder event Toileting steps completed by patient: Adjust clothing prior to toileting Toileting steps completed by helper: Performs perineal hygiene, Adjust clothing after toileting Toileting Assistive Devices: Grab bar or rail  Toileting assist Assist level: Touching or steadying assistance (Pt.75%)   Transfers Chair/bed transfer Chair/bed transfer activity did not occur:  Refused Chair/bed transfer method: Ambulatory Chair/bed transfer assist level: Touching or steadying assistance (Pt > 75%) Chair/bed transfer assistive device: Armrests, Medical sales representative Ambulation activity did not occur: Refused   Max distance: 60 Assist level: Supervision or verbal cues   Wheelchair Wheelchair activity did not occur: Refused        Cognition Comprehension Comprehension assist level: Follows basic conversation/direction with no assist, Understands complex 90% of the time/cues 10% of the time  Expression Expression assist level: Expresses complex 90% of the time/cues < 10% of the time  Social Interaction Social Interaction assist level: Interacts appropriately with others - No medications needed.  Problem Solving Problem solving assist level: Solves basic problems with no assist, Solves complex 90% of the time/cues < 10% of the time  Memory Memory assist level: Recognizes or recalls 90% of the time/requires cueing < 10% of the time   Medical Problem List and Plan:  1. Functional and mobility deficits secondary to debility after congestive heart failure/LVAD/VDRF and multiple other medical issues  -poor activity tolerance -decreased therapy to 2 hrs per day over 7 days intensity   2. DVT Prophylaxis/Anticoagulation: Pharmaceutical: Coumadin INR therapeutic 3. Pain Management: tylenol prn.   -T#3 for hydrotherapy 4. Mood: LCSW to follow for evaluation and support.  5. Neuropsych: This patient is capable of making decisions on his own behalf.  6. Deep tissue injury bilateral buttocks/Skin/Wound Care: Air mattress overlay.   -Hydrotherapy to buttocks M->Sa. Area improving  -T#3 as above 7. Fluids/Electrolytes/Nutrition: Strict I/O. Continue nutritional supplement to promote wound healing   -I personally reviewed the patient's labs today. Again stable   -reduce frequency of his labs (values have been stable) 8. T2DM: Monitor BS ac/hs. Continue Lantus    -good control 7/19 9. A fib: Off amiodarone.   10. 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 Started on [facility] 7/12. Continue Spironolactone and digoxin daily. Monitor renal status serially.  11. [name]/VF: S/p emergent cardioversion 6/24 and 6/26-->ICD turned on 7/12. To keep K> 4.0 and Mg> 2.0  12. ABLA: Improved with transfusion.  Hemoglobin 9.4 on 7/14 13. Dysphagia: Continue dysphagia 2, honey liquids with chin tuck.   -continue to push po liquids--doing fairly well 14. LVAD implant 6/17: On coumadin and low dose ASA.   -Education ongoing with patient and wife. feeling more comfortable with process  -Dressing changes per VAD coordinator or trained caregiver.   LOS (Days) [place] EVALUATION WAS PERFORMED  Meredith Staggers, MD 09/17/2017 10:12 AM

## 2017-09-17 NOTE — Progress Notes (Signed)
[place] for coumadin Indication: LVAD  Allergies  Allergen Reactions  . Penicillins Hives, Itching and Other (See Comments)    Has patient had a PCN reaction causing immediate rash, facial/tongue/throat swelling, SOB or lightheadedness with hypotension:# # Yes # # Has patient had a PCN reaction causing severe rash involving mucus membranes or skin necrosis: No Has patient had a PCN reaction that required hospitalization: No Has patient had a PCN reaction occurring within the last 10 years: No If all of the above answers are "NO", then may proceed with Cephalosporin use.     Patient Measurements: Height: 5\' 11"  (180.3 cm) Weight: 215 lb (97.5 kg) IBW/kg (Calculated) : 75.3 Heparin Dosing Weight: 98.3 kg  Vital Signs: Temp: 97.5 F (36.4 C) (07/19 0508) Temp Source: Axillary (07/19 0508) BP: 106/93 (07/19 0508) Pulse Rate: 78 (07/19 0508)  Labs: Recent Labs    09/15/17 0354 09/16/17 0414 09/17/17 0437  HGB 9.5* 9.7* 10.2*  HCT 30.9* 32.1* 33.3*  PLT 189 182 193  LABPROT 26.7* 28.8* 27.5*  INR 2.49 2.75 2.59  CREATININE 1.15 1.25* 1.22    Estimated Creatinine Clearance: 70 mL/min (by C-G formula based on SCr of 1.22 mg/dL).   Medical History: Past Medical History:  Diagnosis Date  . AICD (automatic cardioverter/defibrillator) present 02/05/2014   Upgrade to Med[employer]onic biventricular ICD, serial number  [id number]   . Atrial flutter ([place]) 04/2012   s/p TEE-EPS+RFCA 04/2012  . CAD (coronary artery disease) 6812,7517 X 2    RCA-T, 70% PL (off CFX), 99% Prox LAD/90% Dist LAD, S/P TAXUS stent x 2  . CHF (congestive heart failure) ([place])   . Chronic anticoagulation   . Chronic systolic heart failure ([place])   . CKD (chronic kidney disease)   . Diabetic retinopathy ([place])   . DM type 2 (diabetes mellitus, type 2) ([facility])    insulin dependent  . HTN (hypertension)   . Hypercholesteremia    ablation  . ICD (implantable cardiac  defibrillator) in place   . Ischemic cardiomyopathy March 2015   20-25% 2D   . Nephrolithiasis   . Ventricular tachycardia ([place])     Assessment: 64 yoM s/p LVAD on 6/17 on warfarin per Rx. INR slightly above goal at 2.59 this morning. CBC and LDH stable. No S/Sx bleeding noted.  Goal of Therapy:  INR goal 2-2.5 Monitor platelets by anticoagulation protocol: Yes   Plan:   -Warfarin 2mg  PO x1 tonight - suspect pt will need ~2.5mg  daily at discharge from CIR -Daily INR, CBC, LDH  Arrie Senate, PharmD, BCPS Clinical Pharmacist [phone] Please check AMION for all [place] numbers 09/17/2017

## 2017-09-17 NOTE — Plan of Care (Signed)
  Problem: Consults Goal: RH GENERAL PATIENT EDUCATION Description See Patient Education module for education specifics. Outcome: Progressing Goal: Skin Care Protocol Initiated - if Braden Score 18 or less Description If consults are not indicated, leave blank or document N/A Outcome: Progressing   Problem: RH BOWEL ELIMINATION Goal: RH STG MANAGE BOWEL WITH ASSISTANCE Description STG Manage Bowel with min.Assistance.  Outcome: Progressing Goal: RH STG MANAGE BOWEL W/MEDICATION W/ASSISTANCE Description STG Manage Bowel with Medication with min.Assistance.  Outcome: Progressing   Problem: RH BLADDER ELIMINATION Goal: RH STG MANAGE BLADDER WITH ASSISTANCE Description STG Manage Bladder With min.Assistance  Outcome: Progressing   Problem: RH SKIN INTEGRITY Goal: RH STG SKIN FREE OF INFECTION/BREAKDOWN Description With Mod. assist.  Outcome: Progressing Goal: RH STG MAINTAIN SKIN INTEGRITY WITH ASSISTANCE Description STG Maintain Skin Integrity With Mod.Assistance.  Outcome: Progressing Goal: RH STG ABLE TO PERFORM INCISION/WOUND CARE W/ASSISTANCE Description STG Able To Perform Incision/Wound Care With Mod.Assistance.  Outcome: Progressing   Problem: RH SAFETY Goal: RH STG ADHERE TO SAFETY PRECAUTIONS W/ASSISTANCE/DEVICE Description STG Adhere to Safety Precautions With Mod.Assistance/Device.  Outcome: Progressing Goal: RH STG DECREASED RISK OF FALL WITH ASSISTANCE Description STG Decreased Risk of Fall With Mod.Assistance.  Outcome: Progressing   Problem: RH PAIN MANAGEMENT Goal: RH STG PAIN MANAGED AT OR BELOW PT'S PAIN GOAL Description Less than3,on 1 to 10 scale.  Outcome: Progressing

## 2017-09-17 NOTE — Progress Notes (Signed)
Pt just finished tossing and turning to get comfortable in bed per assigned nurse.

## 2017-09-17 NOTE — Progress Notes (Signed)
Speech Language Pathology Daily Session Note  Patient Details  Name: [name] [name] MRN: [id number] Date of Birth: 04/15/1950  Today's Date: 09/17/2017 SLP Individual Time: 0730-0830 SLP Individual Time Calculation (min): 60 min  Short Term Goals: Week 2: SLP Short Term Goal 1 (Week 2): Pt will consume dys 2 textures and honey thick liquids with Mod I use of swallowing precautions and minimal overt s/s of aspiration.  SLP Short Term Goal 2 (Week 2): Pt will consume therapeutic trials of ice chips with supervision cues for use of swallowing precautions and minimal overt s/s of aspiration.  SLP Short Term Goal 3 (Week 2): Pt will recall new information with supervision cues for use of external aids.   SLP Short Term Goal 4 (Week 2): Pt will complete semi-complex tasks with min cues for functional problem solving.   SLP Short Term Goal 5 (Week 2): Pt will return demonstration of EMST trainer with mod I and for 75 repetitions a self reported effort level of <7/10.  SLP Short Term Goal 6 (Week 2): Pt will increase vocal intensity to achieve intelligibility at the sentence level with min assist.   Skilled Therapeutic Interventions: Skilled treatment session focused on cognition goals. SLP facilitated session by providing skilled observation of pt consuming ice chips and then small cup sips of thin water. Pt with increased overt s/s of aspiration c/b throat clears in 5 out of 15 trials (known silent aspirator). When pt is attentive to voice/speech intelligibility, he is able to produce more voicing. SLP also facilitated session by providing instruction for novel semi-complex card game. Pt able to comprehend and independently play game. Pt was left sitting edge of bed with wife present. Continue per current plan of care.      Function:  Eating Eating   Modified Consistency Diet: Yes Eating Assist Level: Supervision or verbal cues           Cognition Comprehension Comprehension assist level:  Follows basic conversation/direction with no assist;Understands complex 90% of the time/cues 10% of the time  Expression   Expression assist level: Expresses complex 90% of the time/cues < 10% of the time  Social Interaction Social Interaction assist level: Interacts appropriately with others - No medications needed.  Problem Solving Problem solving assist level: Solves basic problems with no assist;Solves complex 90% of the time/cues < 10% of the time  Memory Memory assist level: Recognizes or recalls 90% of the time/requires cueing < 10% of the time    Pain    Therapy/Group: Individual Therapy  Durga Saldarriaga 09/17/2017, 8:46 AM

## 2017-09-18 ENCOUNTER — Inpatient Hospital Stay (HOSPITAL_COMMUNITY): Payer: PPO | Admitting: Occupational Therapy

## 2017-09-18 ENCOUNTER — Ambulatory Visit (HOSPITAL_COMMUNITY): Payer: PPO

## 2017-09-18 ENCOUNTER — Inpatient Hospital Stay (HOSPITAL_COMMUNITY): Payer: PPO | Admitting: Physical Therapy

## 2017-09-18 DIAGNOSIS — D62 Acute posthemorrhagic anemia: Secondary | ICD-10-CM

## 2017-09-18 LAB — PROTIME-INR
INR: 3.09
PROTHROMBIN TIME: 31.7 s — AB (ref 11.4–15.2)

## 2017-09-18 LAB — GLUCOSE, CAPILLARY
GLUCOSE-CAPILLARY: 133 mg/dL — AB (ref 70–99)
GLUCOSE-CAPILLARY: 133 mg/dL — AB (ref 70–99)
Glucose-Capillary: 73 mg/dL (ref 70–99)
Glucose-Capillary: 155 mg/dL — ABNORMAL HIGH (ref 70–99)

## 2017-09-18 NOTE — Progress Notes (Signed)
RN called to room by patient's wife to look on the drive line exit site. Noted a very minimal odorless, whitish drainage around the exit site. Pt afebrile.  Wife cleaned and changed dressing using sterile technique. Pt complains that CHG causes burning sensation when the area was cleaned. VAD coordinator paged and [name] [name]  informed of above observation.

## 2017-09-18 NOTE — Progress Notes (Signed)
Physical Therapy Wound Treatment Patient Details  Name: [name] [name] MRN: [id number] Date of Birth: 22-Oct-1950  Today's Date: 09/18/2017 PT Individual Time: 1561-5379 PT Individual Time Calculation (min): 33 min   Subjective  Subjective: Pt sleepy Patient and Family Stated Goals: Heal wound Prior Treatments: Wet to dry dressing changes with Santyl  Pain Score: Pain Score: 2   Wound Assessment  Pressure Injury 08/27/17 Unstageable - Full thickness tissue loss in which the base of the ulcer is covered by slough (yellow, tan, gray, green or brown) and/or eschar (tan, brown or black) in the wound bed. 8.5 x 6 (90% darkened 5% yellow and 5% pink (Active)  Dressing Type Gauze (Comment);Foam;Barrier Film (skin prep) 09/18/2017  1:00 PM  Dressing Clean;Dry;Intact;Changed 09/18/2017  1:00 PM  Dressing Change Frequency Daily 09/18/2017  1:00 PM  State of Healing Non-healing 09/18/2017  1:00 PM  Site / Wound Assessment Pink;Yellow 09/18/2017  1:00 PM  % Wound base Red or Granulating 40% 09/18/2017  1:00 PM  % Wound base Yellow/Fibrinous Exudate 60% 09/18/2017  1:00 PM  % Wound base Black/Eschar 0% 09/18/2017  1:00 PM  % Wound base Other/Granulation Tissue (Comment) 0% 09/18/2017  1:00 PM  Peri-wound Assessment Intact 09/18/2017  1:00 PM  Wound Length (cm) 9.1 cm 09/10/2017 10:51 AM  Wound Width (cm) 7.5 cm 09/10/2017 10:51 AM  Wound Depth (cm) 0.2 cm 09/10/2017 10:51 AM  Wound Surface Area (cm^2) 68.25 cm^2 09/10/2017 10:51 AM  Wound Volume (cm^3) 13.65 cm^3 09/10/2017 10:51 AM  Tunneling (cm) 0 09/10/2017 10:51 AM  Undermining (cm) 0 09/10/2017 10:51 AM  Margins Unattached edges (unapproximated) 09/18/2017  1:00 PM  Drainage Amount Moderate 09/18/2017  1:00 PM  Drainage Description Purulent 09/18/2017  1:00 PM  Treatment Debridement (Selective);Hydrotherapy (Pulse lavage) 09/18/2017  1:00 PM   Santyl applied to wound bed prior to applying dressing.    Hydrotherapy Pulsed lavage therapy - wound  location: R buttock Pulsed Lavage with Suction (psi): 8 psi Pulsed Lavage with Suction - Normal Saline Used: 1000 mL Pulsed Lavage Tip: Tip with splash shield Selective Debridement Selective Debridement - Location: R buttock Selective Debridement - Tools Used: Forceps;Scissors Selective Debridement - Tissue Removed: yellow necrotic tissue   Wound Assessment and Plan  Wound Therapy - Assess/Plan/Recommendations Wound Therapy - Clinical Statement: Slow progress with removal of necrotic tissue. Yellow slough adherant to wound bed. Continue to perform hydrotherapy for removal of necrotic tissue and decrease of bioburden. Wound Therapy - Functional Problem List: Decreased tolerance for OOB and position changes Factors Delaying/Impairing Wound Healing: Diabetes Mellitus;Immobility;Multiple medical problems Hydrotherapy Plan: Debridement;Dressing change;Patient/family education;Pulsatile lavage with suction Wound Therapy - Frequency: 6X / week Wound Therapy - Follow Up Recommendations: Home health RN Wound Plan: See above  Wound Therapy Goals- Improve the function of patient's integumentary system by progressing the wound(s) through the phases of wound healing (inflammation - proliferation - remodeling) by: Decrease Necrotic Tissue to: 40 Decrease Necrotic Tissue - Progress: Progressing toward goal Increase Granulation Tissue to: 60 Increase Granulation Tissue - Progress: Progressing toward goal  Goals will be updated until maximal potential achieved or discharge criteria met.  Discharge criteria: when goals achieved, discharge from hospital, MD decision/surgical intervention, no progress towards goals, refusal/missing three consecutive treatments without notification or medical reason.  GP     Shary Decamp [place] 09/18/2017, 1:21 PM [place]

## 2017-09-18 NOTE — Plan of Care (Signed)
  Problem: RH SKIN INTEGRITY Goal: RH STG SKIN FREE OF INFECTION/BREAKDOWN Description With Mod. assist.  Outcome: Not Progressing; pt on hydrotherapy for unstageable wound

## 2017-09-18 NOTE — Progress Notes (Signed)
[place] for coumadin Indication: LVAD  Allergies  Allergen Reactions  . Penicillins Hives, Itching and Other (See Comments)    Has patient had a PCN reaction causing immediate rash, facial/tongue/throat swelling, SOB or lightheadedness with hypotension:# # Yes # # Has patient had a PCN reaction causing severe rash involving mucus membranes or skin necrosis: No Has patient had a PCN reaction that required hospitalization: No Has patient had a PCN reaction occurring within the last 10 years: No If all of the above answers are "NO", then may proceed with Cephalosporin use.     Patient Measurements: Height: 5\' 11"  (180.3 cm) Weight: 207 lb 3.7 oz (94 kg) IBW/kg (Calculated) : 75.3 Heparin Dosing Weight: 98.3 kg  Vital Signs: Temp: 97.9 F (36.6 C) (07/20 0448) Temp Source: Oral (07/20 0448) BP: 87/60 (07/20 0448) Pulse Rate: 71 (07/20 0500)  Labs: Recent Labs    09/16/17 0414 09/17/17 0437 09/18/17 0409  HGB 9.7* 10.2*  --   HCT 32.1* 33.3*  --   PLT 182 193  --   LABPROT 28.8* 27.5* 31.7*  INR 2.75 2.59 3.09  CREATININE 1.25* 1.22  --     Estimated Creatinine Clearance: 68.8 mL/min (by C-G formula based on SCr of 1.22 mg/dL).  Assessment: 61 yoM s/p LVAD on 6/17 on warfarin per Rx. INR above goal at 2.59 > 3.09 this morning. CBC and LDH stable. No S/Sx bleeding noted.  Goal of Therapy:  INR goal 2-2.5 Monitor platelets by anticoagulation protocol: Yes   Plan:   - Hold coumadin tonight - suspect pt will need ~2.5mg  daily at discharge from CIR - Daily INR, CBC, LDH  Maryanna Shape, PharmD, BCPS, BCPPS Clinical Pharmacist  Pager: [phone]   09/18/2017

## 2017-09-18 NOTE — Progress Notes (Signed)
Occupational Therapy Session Note  Patient Details  Name: [name] [name] MRN: [id number] Date of Birth: 09-06-1950  Today's Date: 09/18/2017 OT Individual Time: 6789-3810 OT Individual Time Calculation (min): 41 min   Short Term Goals: Week 1:  OT Short Term Goal 1 (Week 1): Pt will complete toilet transfer with Min A and LRAD OT Short Term Goal 2 (Week 1): Pt will complete bathing with Min A and sit<stand level OT Short Term Goal 3 (Week 1): Pt will don shirt with supervision   Skilled Therapeutic Interventions/Progress Updates:    Pt greeted supine in bed. C/o buttocks pain however wanted to wait until end of therapy to have pain medication. He required supervision for switching from battery<wall power and for stand pivot to w/c. He was escorted to dayroom and we worked on standing balance, endurance, and functional ambulation using RW to wash 1 table. Pt side stepped with device and reached outside of base of support with supervision assist. He required one prolonged rest break due to fatigue. Afterwards he was motivated to ambulate back to his room, which he did with supervision and cues for forward gaze. Vcs required for switching from battery<wall power once back in bed. Pt left with visitors and all needs within reach.   Therapy Documentation Precautions:  Precautions Precautions: Sternal, Fall Precaution Comments: LVAD, sacral wound Restrictions Weight Bearing Restrictions: No Vital Signs: Therapy Vitals Temp: (wrong entry) Pulse Rate: (!) 59 BP: 103/62 Oxygen Therapy SpO2: 98 % Pain: Pt medicated by RN at end of session, per pts request Pain Assessment Pain Scale: 0-10 Pain Score: 2  Faces Pain Scale: Hurts a little bit Pain Type: Acute pain Pain Location: Buttocks Pain Orientation: Right;Left Pain Descriptors / Indicators: Discomfort Pain Onset: On-going Pain Intervention(s): Medication (See eMAR)(pre med for hydrotherapy) ADL: ADL ADL Comments: Please see  functional navigator for ADL status     See Function Navigator for Current Functional Status.   Therapy/Group: Individual Therapy  [name] [name] 09/18/2017, 12:29 PM

## 2017-09-18 NOTE — Progress Notes (Signed)
[place] PHYSICAL MEDICINE & REHABILITATION     PROGRESS NOTE    Subjective/Complaints:  Patient is without new problem or complaint today.  No breathing issues  ROS: Patient denies  nausea, vomiting, diarrhea, cough, shortness of breath or chest pain, joint or back pain, headache,   Objective:  No results found. Recent Labs    09/16/17 0414 09/17/17 0437  WBC 5.6 5.0  HGB 9.7* 10.2*  HCT 32.1* 33.3*  PLT 182 193   Recent Labs    09/16/17 0414 09/17/17 0437  NA 134* 136  K 3.6 3.7  CL 96* 99  GLUCOSE 152* 100*  BUN 19 18  CREATININE 1.25* 1.22  CALCIUM 8.8* 9.0   CBG (last 3)  Recent Labs    09/17/17 1709 09/17/17 2121 09/18/17 0700  GLUCAP 182* 131* 73    Wt Readings from Last 3 Encounters:  09/18/17 94 kg (207 lb 3.7 oz)  09/10/17 97.8 kg (215 lb 8 oz)  07/22/17 103.9 kg (229 lb)     Intake/Output Summary (Last 24 hours) at 09/18/2017 1124 Last data filed at 09/18/2017 0800 Gross per 24 hour  Intake 1220 ml  Output 1825 ml  Net -605 ml    Vital Signs: Blood pressure 103/62, pulse (!) 59, temperature 97.9 F (36.6 C), temperature source Oral, resp. rate 20, height 5\' 11"  (1.803 m), weight 94 kg (207 lb 3.7 oz), SpO2 98 %. Physical Exam:  Constitutional: No distress . Vital signs reviewed. HEENT: EOMI, oral membranes moist Neck: supple Cardiovascular: Hum. No JVD    Respiratory: CTA Bilaterally without wheezes or rales. Normal effort    GI: BS +, non-tender, non-distended   Musculoskeletal:  1+ LE edema Neurological: He is oriented to person, place, and time. No cranial nerve deficit.   alert. UE 4- to 4/5 prox to distal. LE: 2+/5 HF, 3/5 KE and 4/5 ADF/PF. No sensory deficits. Fair insight and awareness. Slow to process  Skin: Skin is warm. Sacral wound with coating of fibrous tissue, some increase in pink granulation apparent Psychiatric: pleasant and cooperative     Assessment/Plan: 1. Functional deficits secondary to debility after  CHF/LVAD/VDRF which require 2hr per day over 7 days of interdisciplinary therapy in a comprehensive inpatient rehab setting. Physiatrist is providing close team supervision and 24 hour management of active medical problems listed below. Physiatrist and rehab team continue to assess barriers to discharge/monitor patient progress toward functional and medical goals.  Function:  Bathing Bathing position   Position: Sitting EOB  Bathing parts Body parts bathed by patient: Right arm, Left arm, Chest, Abdomen, Front perineal area, Right upper leg, Left upper leg, Right lower leg, Left lower leg Body parts bathed by helper: Buttocks  Bathing assist Assist Level: Touching or steadying assistance(Pt > 75%)      Upper Body Dressing/Undressing Upper body dressing   What is the patient wearing?: Hospital gown     Pull over shirt/dress - Perfomed by patient: Thread/unthread right sleeve, Thread/unthread left sleeve, Put head through opening, Pull shirt over trunk          Upper body assist Assist Level: Set up      Lower Body Dressing/Undressing Lower body dressing   What is the patient wearing?: Non-skid slipper socks     Pants- Performed by patient: Thread/unthread right pants leg, Pull pants up/down Pants- Performed by helper: Thread/unthread left pants leg Non-skid slipper socks- Performed by patient: Don/doff right sock, Don/doff left sock Non-skid slipper socks- Performed by helper: Don/doff  right sock, Don/doff left sock                  Lower body assist        Toileting Toileting Toileting activity did not occur: No continent bowel/bladder event Toileting steps completed by patient: Adjust clothing prior to toileting, Performs perineal hygiene, Adjust clothing after toileting Toileting steps completed by helper: Performs perineal hygiene, Adjust clothing after toileting Toileting Assistive Devices: Grab bar or rail  Toileting assist Assist level: Touching or steadying  assistance (Pt.75%)   Transfers Chair/bed transfer Chair/bed transfer activity did not occur: Refused Chair/bed transfer method: Ambulatory Chair/bed transfer assist level: Touching or steadying assistance (Pt > 75%) Chair/bed transfer assistive device: Armrests, Medical sales representative Ambulation activity did not occur: Refused   Max distance: 60 Assist level: Supervision or verbal cues   Wheelchair Wheelchair activity did not occur: Refused        Cognition Comprehension Comprehension assist level: Follows complex conversation/direction with extra time/assistive device  Expression Expression assist level: Expresses complex ideas: With extra time/assistive device  Social Interaction Social Interaction assist level: Interacts appropriately with others - No medications needed.  Problem Solving Problem solving assist level: Solves complex 90% of the time/cues < 10% of the time  Memory Memory assist level: Recognizes or recalls 90% of the time/requires cueing < 10% of the time   Medical Problem List and Plan:  1. Functional and mobility deficits secondary to debility after congestive heart failure/LVAD/VDRF and multiple other medical issues  -poor activity tolerance, continue PT OT -decreased therapy to 2 hrs per day over 7 days intensity   2. DVT Prophylaxis/Anticoagulation: Pharmaceutical: Coumadin INR therapeutic 3. Pain Management: tylenol prn.   -T#3 for hydrotherapy 4. Mood: LCSW to follow for evaluation and support.  5. Neuropsych: This patient is capable of making decisions on his own behalf.  6. Deep tissue injury bilateral buttocks/Skin/Wound Care: Air mattress overlay.   -Hydrotherapy to buttocks M->Sa. Area improving  -T#3 as above 7. Fluids/Electrolytes/Nutrition: Strict I/O. Continue nutritional supplement to promote wound healing  Fluid intake has improved to 1350 yesterday  -I personally reviewed the patient's labs today. Again stable   - 8. T2DM:  Monitor BS ac/hs. Continue Lantus   -good control 7/19 9. A fib: Off amiodarone.   10. 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 Started on [facility] 7/12. Continue Spironolactone and digoxin daily. Monitor renal status serially.  BUN/creatinine normal on 09/17/2017 11. [name]/VF: S/p emergent cardioversion 6/24 and 6/26-->ICD turned on 7/12. To keep K> 4.0 and Mg> 2.0  12. ABLA: Improved with transfusion.  Hemoglobin proved to 10.2 on 09/17/2017 13. Dysphagia: Continue dysphagia 2, honey liquids with chin tuck.   -continue to push po liquids--doing fairly well 14. LVAD implant 6/17: On coumadin and low dose ASA.   -Education ongoing with patient and wife. feeling more comfortable with process  -Dressing changes per VAD coordinator or trained caregiver.   LOS (Days) [place] EVALUATION WAS PERFORMED  Charlett Blake, MD 09/18/2017 11:24 AM

## 2017-09-18 NOTE — Progress Notes (Signed)
Occupational Therapy Session Note  Patient Details  Name: [name] [name] MRN: [id number] Date of Birth: 1950/05/01  Today's Date: 09/18/2017 OT Individual Time: 0920-0950 OT Individual Time Calculation (min): 30 min    Skilled Therapeutic Interventions/Progress Updates:    Pt worked on bathing sitting EOB during session.  He was able to complete all UB with supervision as well as donning hospital gown.  He doffed gripper socks with supervision, crossing one LE over the other.  Min assist for sit to stand from the EOB, with mod instructional cueing for hand placement to follow sternal precautions.  Pt continues to want to push up with both hands off of the surface he is sitting on.  Min guard for standing balance while washing peri area.  Unable to wash back peri area secondary to sacral wound and bandages.  Finished session with oral hygiene in sitting with setup.  Pt left sitting EOB at end of session with nursing present and grandson in the room.    Therapy Documentation Precautions:  Precautions Precautions: Sternal, Fall Precaution Comments: LVAD, sacral wound Restrictions Weight Bearing Restrictions: No  Pain: Pain Assessment Pain Scale: Faces Faces Pain Scale: Hurts a little bit Pain Type: Acute pain Pain Location: Buttocks Pain Orientation: Left;Right Pain Descriptors / Indicators: Discomfort;Grimacing Pain Onset: On-going Pain Intervention(s): Medication (See eMAR);Repositioned ADL: See Function Navigator for Current Functional Status.   Therapy/Group: Individual Therapy  [name] OTR/L  09/18/2017, 10:09 AM

## 2017-09-18 NOTE — Progress Notes (Addendum)
   09/18/17 1547  Vitals  Temp 98.5 F (36.9 C)  Temp Source Oral  BP 98/76  MAP (mmHg) 85  Doppler Pressure 83  BP Location Left Arm  BP Method Automatic  Patient Position (if appropriate) Lying  Pulse Rate 73  Pulse Rate Source Monitor  Resp 16  Oxygen Therapy  SpO2 99 %  O2 Device Room Air   Patient claims he's not feeling well and feels tired. V/s taken Offered oral fluids  refused at this time. Patient claims he wants to rest.

## 2017-09-18 NOTE — Progress Notes (Signed)
Patient ID: [name] [name], male   DOB: [birth year], 67 y.o.   MRN: [id number] P    Advanced Heart Failure Rounding Note  PCP-Cardiologist: No primary care provider on file.   Subjective:    Events 6/17 Underwent HM-3 implant -> Unable to close chest with RV failure and high intrathoracic pressure 6/18 Taken back to OR for evacuation of hematoma. NO RVAD needed. Left sternum open  08/19/17 Taken back to OR for sternal closure 6/22 VT => amiodarone started.  6/24 VT/VF=>urgent cardioversion. 6/25 VT  =>urgent cardioversion  6/27 Extubated 7/12 to CIR  No labs today except INR.  Tired after working with PT.   INR 3.   Getting hydrotherapy.   LVAD Interrogation HM 3: Speed: 5300 Flow: 3.8 PI: 6.1 Power: 3.6. VAD interrogated personally. Parameters stable.    Objective:   Weight Range: 207 lb 3.7 oz (94 kg) Body mass index is 28.9 kg/m.   Vital Signs:   Temp:  [97.8 F (36.6 C)-98.1 F (36.7 C)] 97.9 F (36.6 C) (07/20 0448) Pulse Rate:  [31-79] 59 (07/20 0955) Resp:  [18-20] 20 (07/20 0448) BP: (87-103)/(51-73) 103/62 (07/20 0955) SpO2:  [94 %-98 %] 98 % (07/20 0955) Weight:  [207 lb 3.7 oz (94 kg)] 207 lb 3.7 oz (94 kg) (07/20 0448) Last BM Date: 09/16/17   MAP 70s-80s  Weight change: Filed Weights   09/16/17 0615 09/17/17 0440 09/18/17 0448  Weight: 213 lb (96.6 kg) 215 lb (97.5 kg) 207 lb 3.7 oz (94 kg)   Intake/Output:   Intake/Output Summary (Last 24 hours) at 09/18/2017 1149 Last data filed at 09/18/2017 0800 Gross per 24 hour  Intake 1220 ml  Output 1825 ml  Net -605 ml   Physical Exam   General: Well appearing this am. NAD.  HEENT: Normal. Neck: Supple, JVP 7-8 cm. Carotids OK.  Cardiac:  Mechanical heart sounds with LVAD hum present.  Lungs:  CTAB, normal effort.  Abdomen:  NT, ND, no HSM. No bruits or masses. +BS  LVAD exit site: Well-healed and incorporated. Dressing dry and intact. No erythema or drainage. Stabilization device present and  accurately applied. Driveline dressing changed daily per sterile technique. Extremities:  Warm and dry. No cyanosis, clubbing, rash, or edema.  Neuro:  Alert & oriented x 3. Cranial nerves grossly intact. Moves all 4 extremities w/o difficulty. Affect pleasant      Telemetry   N/A.   Labs    CBC Recent Labs    09/16/17 0414 09/17/17 0437  WBC 5.6 5.0  HGB 9.7* 10.2*  HCT 32.1* 33.3*  MCV 81.9 82.0  PLT 182 412   Basic Metabolic Panel Recent Labs    09/16/17 0414 09/17/17 0437  NA 134* 136  K 3.6 3.7  CL 96* 99  CO2 28 28  GLUCOSE 152* 100*  BUN 19 18  CREATININE 1.25* 1.22  CALCIUM 8.8* 9.0  MG 1.8 2.0   Liver Function Tests Recent Labs    09/16/17 0414 09/17/17 0437  AST 23 23  ALT 20 20  ALKPHOS 82 84  BILITOT 0.8 1.2  PROT 7.6 7.9  ALBUMIN 2.4* 2.5*   No results for input(s): LIPASE, AMYLASE in the last 72 hours. Cardiac Enzymes No results for input(s): CKTOTAL, CKMB, CKMBINDEX, TROPONINI in the last 72 hours.  BNP: BNP (last 3 results) Recent Labs    08/30/17 0000 09/05/17 2327 09/13/17 0331  BNP 114.6* 86.1 53.3    ProBNP (last 3 results) Recent Labs  11/12/16 1559  PROBNP 388.0*     D-Dimer No results for input(s): DDIMER in the last 72 hours. Hemoglobin A1C No results for input(s): HGBA1C in the last 72 hours. Fasting Lipid Panel No results for input(s): CHOL, HDL, LDLCALC, TRIG, CHOLHDL, LDLDIRECT in the last 72 hours. Thyroid Function Tests No results for input(s): TSH, T4TOTAL, T3FREE, THYROIDAB in the last 72 hours.  Invalid input(s): FREET3  Other results:   Imaging    No results found.   Medications:     Scheduled Medications: . aspirin  81 mg Oral Daily  . bisacodyl  10 mg Oral Daily   Or  . bisacodyl  10 mg Rectal Daily  . Chlorhexidine Gluconate Cloth  6 each Topical QHS  . collagenase   Topical Daily  . famotidine  20 mg Oral BID  . insulin aspart  0-15 Units Subcutaneous TID WC  . insulin aspart   0-5 Units Subcutaneous QHS  . insulin detemir  25 Units Subcutaneous QHS  . magnesium oxide  400 mg Oral BID  . mouth rinse  15 mL Mouth Rinse BID  . multivitamin with minerals  1 tablet Oral Daily  . nystatin  5 mL Oral QID  . potassium chloride  40 mEq Oral Daily  . protein supplement shake  11 oz Oral BID BM  . sodium chloride flush  10-40 mL Intracatheter Q12H  . spironolactone  25 mg Oral Daily  . torsemide  60 mg Oral Daily  . vitamin C  500 mg Oral BID  . Warfarin - Pharmacist Dosing Inpatient   Does not apply q1800    Infusions: . lactated ringers      PRN Medications: acetaminophen, acetaminophen-codeine, alum & mag hydroxide-simeth, bisacodyl, clonazepam, diphenhydrAMINE, guaiFENesin-dextromethorphan, lactated ringers, levalbuterol, polyethylene glycol, prochlorperazine **OR** prochlorperazine **OR** prochlorperazine, RESOURCE THICKENUP CLEAR, sodium chloride flush, sodium phosphate, traZODone    Patient Profile   [name] [name] is a 67 y.o. 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   Directly admitted with persistent low cardiac output for milrinone initiation for home.  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 Acute/Chronic systolic CHF with biventricular failure-> cardiogenic shock: Echo  08/13/2017 EF 20-25%. s/p  Medtronic BiV ICD in place. Cath 12/18 with stable 1v CAD.  s/p HM-3 implant 6/17 under destination therapy. Unable to close chest due to high-intrathoracic pressures and RV failure. Taken back to OR 08/17/17 with evacuation of hematoma with improvement. Extubated 6/28.  Ramp ECHO 7/11 with speed increased to 5300.  - Volume status stable.    - Continue torsemide 60 mg daily  - Continue spiro 25 mg daily.  2. 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 LDH 180. Ramp ECHO speed increased to 5300.   - VAD interrogated personally.  Parameters stable.    - Continue warfarin + ASA 81, INR 3 => pharmacy to adjust dose.   3. Acute on CKD II-III:  - Resolved.  4. [name]/VF: Recurrent VT on 6/22.  S/P VF 6/24 emergent bedside cardioversion. S/P VT 6/26 emergent bedside cardioversion. ICD turned back on.   - No change to current plan.   5. AFL/atrial fibrillation: S/p previous ablation. He is currently in atrial fibrillation.  - Rate controlled. Amio stopped.  - On coumadin. No bleeding.   6. [name]:  Recent cath with stable CAD as above:  - No s/s of ischemia.    7. [name]: Recent A1c 8.3 on 6/3.  - Continue current regimen. No change.   8. Anemia: Post-op.  Got 1 unit PRBCs 6/22 and 6/25. - Stable. No CBC today.   9. Deconditioning: Severe, working with PT aggressively.   - No change to current plan.   10. Unstageable Pressure Ulcer: R and L buttock.  R buttock with necrosis noted. Continue santyl for selective debridement. Change daily. Continue air overlay. Needs to reposition every hour when in the chair. Keep R /L in bed. . May need a different chair cushion. Added hydrotherapy.   - WOC following. No change.   I reviewed the LVAD parameters from today, and compared the results to the patient's prior recorded data.  No programming changes were made.  The LVAD is functioning within specified parameters.  The patient performs LVAD self-test daily.  LVAD interrogation was negative for any significant power changes, alarms or PI events/speed drops.  LVAD equipment check completed and is in good working order.  Back-up equipment present.   LVAD education done on emergency procedures and precautions and reviewed exit site care.   Length of Stay: 8  Loralie Champagne, MD  09/18/2017, 11:49 AM  Advanced Heart Failure Team Pager [phone] (M-F; [place])  Please contact [facility] for night-coverage after hours (4p -7a ) and weekends on [email or web address]

## 2017-09-18 NOTE — Progress Notes (Signed)
Physical Therapy Session Note  Patient Details  Name: [name] [name] MRN: [id number] Date of Birth: [birth year]  Today's Date: 09/18/2017 PT Individual Time: 1515-1535 PT Individual Time Calculation (min): 20 min   Short Term Goals: Week 2:  PT Short Term Goal 1 (Week 2): = LTG  Skilled Therapeutic Interventions/Progress Updates:   Pt received supine in bed and agreeable to PT. Supine>sit transfer with Supervision assist and min cues for sternal precautions. Pt noted to have labored breath sounds and reports significant SOB as well as difficulty vocalizing 2/2 SOB. PT  assessed Vitals via Dinamap. Pt requested to allow to rest until he is breathing improves.  Vitals:   09/18/17 0955 09/18/17 1542  BP: 103/62 (!) 83/70  Pulse: (!) 59 (!) 103  Resp:    Temp:    SpO2: 98% 98%   RN made aware of pt condition. Left sitting up in bed with call bell in reach in all needs met.       Therapy Documentation Precautions:  Precautions Precautions: Sternal, Fall Precaution Comments: LVAD, sacral wound Restrictions Weight Bearing Restrictions: No General: PT Amount of Missed Time (min): 25 Minutes PT Missed Treatment Reason: Patient fatigue Pain: Pain Assessment Pain Score: 2    See Function Navigator for Current Functional Status.   Therapy/Group: Individual Therapy  Lorie Phenix 09/18/2017, 3:40 PM

## 2017-09-19 ENCOUNTER — Inpatient Hospital Stay (HOSPITAL_COMMUNITY): Payer: PPO

## 2017-09-19 ENCOUNTER — Inpatient Hospital Stay (HOSPITAL_COMMUNITY): Payer: PPO | Admitting: Occupational Therapy

## 2017-09-19 LAB — GLUCOSE, CAPILLARY
GLUCOSE-CAPILLARY: 126 mg/dL — AB (ref 70–99)
GLUCOSE-CAPILLARY: 118 mg/dL — AB (ref 70–99)
Glucose-Capillary: 83 mg/dL (ref 70–99)
Glucose-Capillary: 137 mg/dL — ABNORMAL HIGH (ref 70–99)

## 2017-09-19 LAB — PROTIME-INR
INR: 2.41
PROTHROMBIN TIME: 26 s — AB (ref 11.4–15.2)

## 2017-09-19 MED ORDER — WARFARIN SODIUM 2.5 MG PO TABS
2.5000 mg | ORAL_TABLET | Freq: Once | ORAL | Status: AC
  Administered 2017-09-19: 2.5 mg via ORAL
  Filled 2017-09-19: qty 1

## 2017-09-19 NOTE — Progress Notes (Signed)
Physical Therapy Session Note  Patient Details  Name: [name] [name] MRN: [id number] Date of Birth: [birth year]  Today's Date: 09/19/2017 PT Individual Time: 1300-1335 PT Individual Time Calculation (min): 35 min   Short Term Goals: Week 1:  PT Short Term Goal 1 (Week 1): Pt will ambulate 25' w/ min assist PT Short Term Goal 1 - Progress (Week 1): Met PT Short Term Goal 2 (Week 1): Pt will maintain sternal precautions during functional mobility w/ min cues 50% of the time PT Short Term Goal 2 - Progress (Week 1): Met PT Short Term Goal 3 (Week 1): Pt will perform bed<>chair transfer w/ min assist PT Short Term Goal 3 - Progress (Week 1): Met PT Short Term Goal 4 (Week 1): Pt will demonstrate safe transition of LVAD between battery and wall power w/o cues 50% of the time PT Short Term Goal 4 - Progress (Week 1): Progressing toward goal Week 2:  PT Short Term Goal 1 (Week 2): = LTG  Skilled Therapeutic Interventions/Progress Updates:    Pt reporting being very tired today but agreeable to attempt session if kept it "easy." Pt's daughter, Anson Crofts, assisted with LVAD connection to portable batteries. Functional transfers with min assist for sit <> stands with RW with cues for hand placement for adherence to sternal precautions. Gait training x 100' x 2 in session with overall supervision for functional strengthening and endurance with RW and cues for upright posture. Kinetron in seated and standing positions on 60 cm/sec resistance for functional LE strengthening to aid with overall ability to perform sit <> stands with decreased reliance on UE's. Pt limited in standing due to fatigue. Request to return back to room and end session early. Checked daughter off on transfers and for short distance gait in room and into hallway to promote more frequent mobility and agreeable to get up again later today. Education on importance of mobility and OOB on recovery and regaining functional endurance. Total  assist for connecting back to wall unit for battery of LVAD.  Therapy Documentation Precautions:  Precautions Precautions: Sternal, Fall Precaution Comments: LVAD, sacral wound Restrictions Weight Bearing Restrictions: No General: PT Amount of Missed Time (min): 25 Minutes PT Missed Treatment Reason: Patient fatigue Pain:  Reports soreness and pain in sternal region.   See Function Navigator for Current Functional Status.   Therapy/Group: Individual Therapy  Canary Brim Ivory Broad, PT, DPT  09/19/2017, 1:44 PM

## 2017-09-19 NOTE — Plan of Care (Signed)
  Problem: RH SKIN INTEGRITY Goal: RH STG SKIN FREE OF INFECTION/BREAKDOWN Description With Mod. assist.  Outcome: Not Progressing; unstageable wound sacral area on hydrotherapy and daily dressings

## 2017-09-19 NOTE — Progress Notes (Signed)
Crellin for coumadin Indication: LVAD  Allergies  Allergen Reactions  . Penicillins Hives, Itching and Other (See Comments)    Has patient had a PCN reaction causing immediate rash, facial/tongue/throat swelling, SOB or lightheadedness with hypotension:# # Yes # # Has patient had a PCN reaction causing severe rash involving mucus membranes or skin necrosis: No Has patient had a PCN reaction that required hospitalization: No Has patient had a PCN reaction occurring within the last 10 years: No If all of the above answers are "NO", then may proceed with Cephalosporin use.     Patient Measurements: Height: 5\' 11"  (180.3 cm) Weight: 209 lb 7 oz (95 kg) IBW/kg (Calculated) : 75.3 Heparin Dosing Weight: 98.3 kg  Vital Signs: Temp: 98.2 F (36.8 C) (07/21 0442) Temp Source: Oral (07/21 0442) BP: 89/65 (07/21 0442) Pulse Rate: 60 (07/21 0442)  Labs: Recent Labs    09/17/17 0437 09/18/17 0409 09/19/17 0340  HGB 10.2*  --   --   HCT 33.3*  --   --   PLT 193  --   --   LABPROT 27.5* 31.7* 26.0*  INR 2.59 3.09 2.41  CREATININE 1.22  --   --     Estimated Creatinine Clearance: 69.1 mL/min (by C-G formula based on SCr of 1.22 mg/dL).  Assessment: 86 yoM s/p LVAD on 6/17 on warfarin per Rx. Patient previously therapeutic on 3 mg dosing. Patient had post-op anemia with downtrending plts. Plts down to 193. Currently CBC stable. No S/Sx bleeding noted.  Goal of Therapy:  INR goal 2-2.5 Monitor platelets by anticoagulation protocol: Yes   Plan:   - Warfarin 2.5 mg tonight - Suspect pt will need ~2.5mg  daily at discharge from CIR - Daily INR, CBC, LDH  Angus Seller, PharmD Pharmacy Resident 09/19/2017 8:51 AM

## 2017-09-19 NOTE — Progress Notes (Signed)
[facility]IC[facility] RE[facility]ILITATION     PROGRESS NOTE    Subjective/Complaints:  No new issues overnight  ROS: Patient denies  nausea, vomiting, diarrhea, cough, shortness of breath or chest pain, joint or back pain, headache,   Objective:  No results found. Recent Labs    09/17/17 0437  WBC 5.0  HGB 10.2*  HCT 33.3*  PLT 193   Recent Labs    09/17/17 0437  NA 136  K 3.7  CL 99  GLUCOSE 100*  BUN 18  CREATININE 1.22  CALCIUM 9.0   CBG (last 3)  Recent Labs    09/18/17 1707 09/18/17 2059 09/19/17 0627  GLUCAP 133* 133* 83    Wt Readings from Last 3 Encounters:  09/19/17 95 kg (209 lb 7 oz)  09/10/17 97.8 kg (215 lb 8 oz)  07/22/17 103.9 kg (229 lb)     Intake/Output Summary (Last 24 hours) at 09/19/2017 1050 Last data filed at 09/19/2017 0935 Gross per 24 hour  Intake 1320 ml  Output 700 ml  Net 620 ml    Vital Signs: Blood pressure (!) 89/65, pulse 60, temperature 98.2 F (36.8 C), temperature source Oral, resp. rate 18, height 5\' 11"  (1.803 m), weight 95 kg (209 lb 7 oz), SpO2 96 %. Physical Exam:  Constitutional: No distress . Vital signs reviewed. HEENT: EOMI, oral membranes moist Neck: supple Cardiovascular: Hum. No JVD    Respiratory: CTA Bilaterally without wheezes or rales. Normal effort    GI: BS +, non-tender, non-distended   Musculoskeletal:  1+ LE edema Neurological: He is oriented to person, place, and time. No cranial nerve deficit.   alert. UE 4- to 4/5 prox to distal. LE: 2+/5 HF, 3/5 KE and 4/5 ADF/PF. No sensory deficits. Fair insight and awareness. Slow to process  Skin: Skin is warm. Sacral wound with coating of fibrous tissue, some increase in pink granulation apparent Psychiatric: pleasant and cooperative     Assessment/Plan: 1. Functional deficits secondary to debility after CHF/LVAD/VDRF which require 2hr per day over 7 days of interdisciplinary therapy in a comprehensive inpatient rehab  setting. Physiatrist is providing close team supervision and 24 hour management of active medical problems listed below. Physiatrist and rehab team continue to assess barriers to discharge/monitor patient progress toward functional and medical goals.  Function:  Bathing Bathing position   Position: Sitting EOB  Bathing parts Body parts bathed by patient: Right arm, Left arm, Chest, Abdomen, Front perineal area, Right upper leg, Left upper leg, Right lower leg, Left lower leg Body parts bathed by helper: Buttocks  Bathing assist Assist Level: Touching or steadying assistance(Pt > 75%)      Upper Body Dressing/Undressing Upper body dressing   What is the patient wearing?: Hospital gown     Pull over shirt/dress - Perfomed by patient: Thread/unthread right sleeve, Thread/unthread left sleeve, Put head through opening, Pull shirt over trunk          Upper body assist Assist Level: Set up      Lower Body Dressing/Undressing Lower body dressing   What is the patient wearing?: Non-skid slipper socks     Pants- Performed by patient: Thread/unthread right pants leg, Pull pants up/down Pants- Performed by helper: Thread/unthread left pants leg Non-skid slipper socks- Performed by patient: Don/doff right sock, Don/doff left sock Non-skid slipper socks- Performed by helper: Don/doff right sock, Don/doff left sock                  Lower  body assist        Toileting Toileting Toileting activity did not occur: No continent bowel/bladder event Toileting steps completed by patient: Adjust clothing prior to toileting, Performs perineal hygiene, Adjust clothing after toileting Toileting steps completed by helper: Performs perineal hygiene, Adjust clothing after toileting Toileting Assistive Devices: Grab bar or rail  Toileting assist Assist level: Touching or steadying assistance (Pt.75%)   Transfers Chair/bed transfer Chair/bed transfer activity did not occur: Refused Chair/bed  transfer method: Ambulatory Chair/bed transfer assist level: Touching or steadying assistance (Pt > 75%) Chair/bed transfer assistive device: Armrests, Medical sales representative Ambulation activity did not occur: Refused   Max distance: 60 Assist level: Supervision or verbal cues   Wheelchair Wheelchair activity did not occur: Refused        Cognition Comprehension Comprehension assist level: Understands complex 90% of the time/cues 10% of the time  Expression Expression assist level: Expresses complex 90% of the time/cues < 10% of the time  Social Interaction Social Interaction assist level: Interacts appropriately with others - No medications needed.  Problem Solving Problem solving assist level: Solves complex 90% of the time/cues < 10% of the time  Memory Memory assist level: Recognizes or recalls 90% of the time/requires cueing < 10% of the time   Medical Problem List and Plan:  1. Functional and mobility deficits secondary to debility after congestive heart failure/LVAD/VDRF and multiple other medical issues  -poor activity tolerance, continue PT OT -decreased therapy to 2 hrs per day over 7 days intensity   2. DVT Prophylaxis/Anticoagulation: Pharmaceutical: Coumadin INR therapeutic 3. Pain Management: tylenol prn.   -T#3 for hydrotherapy 4. Mood: LCSW to follow for evaluation and support.  5. Neuropsych: This patient is capable of making decisions on his own behalf.  6. Deep tissue injury bilateral buttocks/Skin/Wound Care: Air mattress overlay.   -Hydrotherapy to buttocks M->Sa. Area improving  -T#3 as above 7. Fluids/Electrolytes/Nutrition: Strict I/O. Continue nutritional supplement to promote wound healing  Fluid intake has improved to 1320 yesterday  -I personally reviewed the patient's labs today. Again stable   - 8. T2DM: Monitor BS ac/hs. Continue Lantus   -good control 7/19 9. A fib: Off amiodarone.   10. Acute on chronic biventricular CHF: Daily  weights. Started on [facility] 7/12. Continue Spironolactone and digoxin daily. Monitor renal status serially.  BUN/creatinine normal on 09/17/2017 11. [name]/VF: S/p emergent cardioversion 6/24 and 6/26-->ICD turned on 7/12. To keep K> 4.0 and Mg> 2.0  12. ABLA: Improved with transfusion.  Hemoglobin proved to 10.2 on 09/17/2017 13. Dysphagia: Continue dysphagia 2, honey liquids with chin tuck.   -continue to push po liquids--doing fairly well 14. LVAD implant 6/17: On coumadin and low dose ASA.   -Education ongoing with patient and wife. feeling more comfortable with process  -Dressing changes per VAD coordinator or trained caregiver.   LOS (Days) 9 A FACE TO FACE EVALUATION WAS PERFORMED  Charlett Blake, MD 09/19/2017 10:50 AM

## 2017-09-19 NOTE — Progress Notes (Signed)
Patient ID: [name] [name], male   DOB: Apr 01, 1950, 67 y.o.   MRN: [id number] P    Advanced Heart Failure Rounding Note  PCP-Cardiologist: No primary care provider on file.   Subjective:    Events 6/17 Underwent HM-3 implant -> Unable to close chest with RV failure and high intrathoracic pressure 6/18 Taken back to OR for evacuation of hematoma. NO RVAD needed. Left sternum open  08/19/17 Taken back to OR for sternal closure 6/22 VT => amiodarone started.  6/24 VT/VF=>urgent cardioversion. 6/25 VT  =>urgent cardioversion  6/27 Extubated 7/12 to CIR   INR 2.4. MAP 80s.  Some soreness in chest at surgical site.   Getting hydrotherapy.   LVAD Interrogation HM 3: Speed: 5300 Flow: 3.9 PI: 4.2 Power: 3.8. VAD interrogated personally. Parameters stable.    Objective:   Weight Range: 209 lb 7 oz (95 kg) Body mass index is 29.21 kg/m.   Vital Signs:   Temp:  [98.2 F (36.8 C)-98.7 F (37.1 C)] 98.2 F (36.8 C) (07/21 0442) Pulse Rate:  [60-103] 60 (07/21 0442) Resp:  [16-18] 18 (07/21 0442) BP: (83-99)/(65-76) 89/65 (07/21 0442) SpO2:  [96 %-100 %] 96 % (07/21 0442) Weight:  [209 lb 7 oz (95 kg)] 209 lb 7 oz (95 kg) (07/21 0442) Last BM Date: 09/18/17   MAP 80s  Weight change: Filed Weights   09/17/17 0440 09/18/17 0448 09/19/17 0442  Weight: 215 lb (97.5 kg) 207 lb 3.7 oz (94 kg) 209 lb 7 oz (95 kg)   Intake/Output:   Intake/Output Summary (Last 24 hours) at 09/19/2017 1124 Last data filed at 09/19/2017 0935 Gross per 24 hour  Intake 1320 ml  Output 700 ml  Net 620 ml   Physical Exam   General: Well appearing this am. NAD.  HEENT: Normal. Neck: Supple, JVP 7-8 cm. Carotids OK.  Cardiac:  Mechanical heart sounds with LVAD hum present.  Lungs:  CTAB, normal effort.  Abdomen:  NT, ND, no HSM. No bruits or masses. +BS  LVAD exit site: Well-healed and incorporated. Dressing dry and intact. No erythema or drainage. Stabilization device present and accurately  applied. Driveline dressing changed daily per sterile technique. Extremities:  Warm and dry. No cyanosis, clubbing, rash, or edema.  Neuro:  Alert & oriented x 3. Cranial nerves grossly intact. Moves all 4 extremities w/o difficulty. Affect pleasant    Telemetry   N/A.   Labs    CBC Recent Labs    09/17/17 0437  WBC 5.0  HGB 10.2*  HCT 33.3*  MCV 82.0  PLT 322   Basic Metabolic Panel Recent Labs    09/17/17 0437  NA 136  K 3.7  CL 99  CO2 28  GLUCOSE 100*  BUN 18  CREATININE 1.22  CALCIUM 9.0  MG 2.0   Liver Function Tests Recent Labs    09/17/17 0437  AST 23  ALT 20  ALKPHOS 84  BILITOT 1.2  PROT 7.9  ALBUMIN 2.5*   No results for input(s): LIPASE, AMYLASE in the last 72 hours. Cardiac Enzymes No results for input(s): CKTOTAL, CKMB, CKMBINDEX, TROPONINI in the last 72 hours.  BNP: BNP (last 3 results) Recent Labs    08/30/17 0000 09/05/17 2327 09/13/17 0331  BNP 114.6* 86.1 53.3    ProBNP (last 3 results) Recent Labs    11/12/16 1559  PROBNP 388.0*     D-Dimer No results for input(s): DDIMER in the last 72 hours. Hemoglobin A1C No results for input(s): HGBA1C in  the last 72 hours. Fasting Lipid Panel No results for input(s): CHOL, HDL, LDLCALC, TRIG, CHOLHDL, LDLDIRECT in the last 72 hours. Thyroid Function Tests No results for input(s): TSH, T4TOTAL, T3FREE, THYROIDAB in the last 72 hours.  Invalid input(s): FREET3  Other results:   Imaging    No results found.   Medications:     Scheduled Medications: . aspirin  81 mg Oral Daily  . bisacodyl  10 mg Oral Daily   Or  . bisacodyl  10 mg Rectal Daily  . Chlorhexidine Gluconate Cloth  6 each Topical QHS  . collagenase   Topical Daily  . famotidine  20 mg Oral BID  . insulin aspart  0-15 Units Subcutaneous TID WC  . insulin aspart  0-5 Units Subcutaneous QHS  . insulin detemir  25 Units Subcutaneous QHS  . magnesium oxide  400 mg Oral BID  . mouth rinse  15 mL Mouth  Rinse BID  . multivitamin with minerals  1 tablet Oral Daily  . nystatin  5 mL Oral QID  . potassium chloride  40 mEq Oral Daily  . protein supplement shake  11 oz Oral BID BM  . sodium chloride flush  10-40 mL Intracatheter Q12H  . spironolactone  25 mg Oral Daily  . torsemide  60 mg Oral Daily  . vitamin C  500 mg Oral BID  . warfarin  2.5 mg Oral ONCE-1800  . Warfarin - Pharmacist Dosing Inpatient   Does not apply q1800    Infusions: . lactated ringers      PRN Medications: acetaminophen, acetaminophen-codeine, alum & mag hydroxide-simeth, bisacodyl, clonazepam, diphenhydrAMINE, guaiFENesin-dextromethorphan, lactated ringers, levalbuterol, polyethylene glycol, prochlorperazine **OR** prochlorperazine **OR** prochlorperazine, RESOURCE THICKENUP CLEAR, sodium chloride flush, sodium phosphate, traZODone    Patient Profile   [name] [name] is a 67 y.o. 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   Directly admitted with persistent low cardiac output for milrinone initiation for home.  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 Acute/Chronic systolic CHF with biventricular failure-> cardiogenic shock: Echo  08/13/2017 EF 20-25%. s/p  Medtronic BiV ICD in place. Cath 12/18 with stable 1v CAD.  s/p HM-3 implant 6/17 under destination therapy. Unable to close chest due to high-intrathoracic pressures and RV failure. Taken back to OR 08/17/17 with evacuation of hematoma with improvement. Extubated 6/28.  Ramp ECHO 7/11 with speed increased to 5300.  - Volume status stable.    - Continue torsemide 60 mg daily  - Continue spiro 25 mg daily.  2. 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 LDH 180. Ramp ECHO speed increased to 5300.   - VAD interrogated personally. Parameters stable.    - Continue warfarin + ASA 81, INR 2.4 today.    3. Acute on CKD II-III:  - Resolved.    4. [name]/VF: Recurrent VT on 6/22.  S/P VF 6/24 emergent bedside cardioversion. S/P VT 6/26 emergent bedside cardioversion. ICD turned back on.   - No change to current plan.   5. AFL/atrial fibrillation: S/p previous ablation. He is currently in atrial fibrillation.  - Rate controlled. Amio stopped.  - On coumadin. No bleeding.   6. [name]: Recent cath with stable CAD as above:  - No s/s of ischemia.    7. [name]: Recent A1c 8.3 on  6/3.  - Continue current regimen. No change.   8. Anemia: Post-op.  Got 1 unit PRBCs 6/22 and 6/25. - Stable. No CBC today.   9. Deconditioning: Severe, working with PT aggressively.   - No change to current plan.   10. Unstageable Pressure Ulcer: R and L buttock.  R buttock with necrosis noted. Continue santyl for selective debridement. Change daily. Continue air overlay. Needs to reposition every hour when in the chair. Keep R /L in bed. . May need a different chair cushion. Added hydrotherapy.   - WOC following. No change.   I reviewed the LVAD parameters from today, and compared the results to the patient's prior recorded data.  No programming changes were made.  The LVAD is functioning within specified parameters.  The patient performs LVAD self-test daily.  LVAD interrogation was negative for any significant power changes, alarms or PI events/speed drops.  LVAD equipment check completed and is in good working order.  Back-up equipment present.   LVAD education done on emergency procedures and precautions and reviewed exit site care.   Length of Stay: 9  Loralie Champagne, MD  09/19/2017, 11:24 AM  Advanced Heart Failure Team Pager [phone] (M-F; 7a - 4p)  Please contact [facility] for night-coverage after hours (4p -7a ) and weekends on [email or web address]

## 2017-09-19 NOTE — Progress Notes (Signed)
Occupational Therapy Session Note  Patient Details  Name: [name] [name] MRN: [id number] Date of Birth: [birth year]  Today's Date: 09/19/2017 OT Individual Time: 0051-1021 OT Individual Time Calculation (min): 76 min   Skilled Therapeutic Interventions/Progress Updates:    Pt greeted EOB finishing up breakfast. RN present and provided pain medication at start of tx. Pt still with sacral wound pain. Min vcs for chin tuck when consuming beverages for remainder of meal. Afterwards he wanted to get washed up EOB. Tx focus on sit<stands, activity tolerance, and balance during BADL routine. Steady assist overall, including sit<stand with RW for completing pericare. Vcs required for LVAD mgt throughout. Pt standing for 4 minutes with steady assist while RN completed sacral dressing change and spouse assisted with perihygiene. After he donned clean hospital gown and completed oral care with setup, pt returned to bed. He was left with all needs within reach and spouse present.   Therapy Documentation Precautions:  Precautions Precautions: Sternal, Fall Precaution Comments: LVAD, sacral wound Restrictions Weight Bearing Restrictions: No Pain: Medicated at start of session. Educated pt to lean Lt<Rt for pressure relief. Multiple rest breaks also provided.  Pain Assessment Pain Score: 4  ADL: ADL ADL Comments: Please see functional navigator for ADL status     See Function Navigator for Current Functional Status.   Therapy/Group: Individual Therapy  Laurelyn Terrero A Marvens Hollars 09/19/2017, 12:28 PM

## 2017-09-20 ENCOUNTER — Inpatient Hospital Stay (HOSPITAL_COMMUNITY): Payer: PPO | Admitting: Physical Therapy

## 2017-09-20 ENCOUNTER — Inpatient Hospital Stay (HOSPITAL_COMMUNITY): Payer: PPO | Admitting: Occupational Therapy

## 2017-09-20 ENCOUNTER — Ambulatory Visit (HOSPITAL_COMMUNITY): Payer: PPO

## 2017-09-20 ENCOUNTER — Inpatient Hospital Stay (HOSPITAL_COMMUNITY): Payer: PPO | Admitting: Speech Pathology

## 2017-09-20 DIAGNOSIS — R7309 Other abnormal glucose: Secondary | ICD-10-CM

## 2017-09-20 DIAGNOSIS — R1310 Dysphagia, unspecified: Secondary | ICD-10-CM

## 2017-09-20 DIAGNOSIS — E871 Hypo-osmolality and hyponatremia: Secondary | ICD-10-CM

## 2017-09-20 DIAGNOSIS — E119 Type 2 diabetes mellitus without complications: Secondary | ICD-10-CM

## 2017-09-20 LAB — COMPREHENSIVE METABOLIC PANEL
ALT: 23 U/L (ref 0–44)
ANION GAP: 10 (ref 5–15)
AST: 26 U/L (ref 15–41)
Albumin: 2.5 g/dL — ABNORMAL LOW (ref 3.5–5.0)
Alkaline Phosphatase: 87 U/L (ref 38–126)
BILIRUBIN TOTAL: 0.9 mg/dL (ref 0.3–1.2)
BUN: 18 mg/dL (ref 8–23)
CHLORIDE: 98 mmol/L (ref 98–111)
CO2: 26 mmol/L (ref 22–32)
Calcium: 8.9 mg/dL (ref 8.9–10.3)
Creatinine, Ser: 1.05 mg/dL (ref 0.61–1.24)
GFR calc non Af Amer: >60 mL/min (ref >60)
Glucose, Bld: 84 mg/dL (ref 70–99)
POTASSIUM: 3.7 mmol/L (ref 3.5–5.1)
Sodium: 134 mmol/L — ABNORMAL LOW (ref 135–145)
TOTAL PROTEIN: 7.9 g/dL (ref 6.5–8.1)

## 2017-09-20 LAB — CBC
HCT: 32.3 % — ABNORMAL LOW (ref 39.0–52.0)
Hemoglobin: 9.8 g/dL — ABNORMAL LOW (ref 13.0–17.0)
MCH: 24.8 pg — ABNORMAL LOW (ref 26.0–34.0)
MCHC: 30.3 g/dL (ref 30.0–36.0)
MCV: 81.8 fL (ref 78.0–100.0)
PLATELETS: 209 10*3/uL (ref 150–400)
RBC: 3.95 MIL/uL — ABNORMAL LOW (ref 4.22–5.81)
RDW: 17.9 % — AB (ref 11.5–15.5)
WBC: 4.5 10*3/uL (ref 4.0–10.5)

## 2017-09-20 LAB — GLUCOSE, CAPILLARY
GLUCOSE-CAPILLARY: 81 mg/dL (ref 70–99)
Glucose-Capillary: 137 mg/dL — ABNORMAL HIGH (ref 70–99)
Glucose-Capillary: 95 mg/dL (ref 70–99)
Glucose-Capillary: 146 mg/dL — ABNORMAL HIGH (ref 70–99)

## 2017-09-20 LAB — MAGNESIUM: MAGNESIUM: 2.1 mg/dL (ref 1.7–2.4)

## 2017-09-20 LAB — PROTIME-INR
INR: 2.11
Prothrombin Time: 23.5 seconds — ABNORMAL HIGH (ref 11.4–15.2)

## 2017-09-20 LAB — LACTATE DEHYDROGENASE: LDH: 193 U/L — AB (ref 98–192)

## 2017-09-20 LAB — BRAIN NATRIURETIC PEPTIDE: B NATRIURETIC PEPTIDE 5: 56 pg/mL (ref 0.0–100.0)

## 2017-09-20 MED ORDER — WARFARIN SODIUM 3 MG PO TABS
3.0000 mg | ORAL_TABLET | Freq: Once | ORAL | Status: AC
  Administered 2017-09-20: 3 mg via ORAL
  Filled 2017-09-20: qty 1

## 2017-09-20 MED ORDER — POTASSIUM CHLORIDE 20 MEQ PO PACK
30.0000 meq | PACK | Freq: Two times a day (BID) | ORAL | Status: DC
  Administered 2017-09-20 – 2017-09-21 (×2): 30 meq via ORAL
  Filled 2017-09-20 (×4): qty 2

## 2017-09-20 NOTE — Progress Notes (Signed)
Occupational Therapy Weekly Progress Note  Patient Details  Name: [name] [name] MRN: [id number] Date of Birth: 1950/06/22  Beginning of progress report period: 09/11/17 End of progress report period: 09/20/17  Today's Date: 09/20/2017 OT Individual Time: 1405-1502 OT Individual Time Calculation (min): 57 min   Patient has met 3 of 3 short term goals.    At time of evaluation, functional status was difficult to assess due to altered mental status and dizziness. These symptoms have improved and he now can actively participate during therapy. He presently requires steady assist-supervision for BADL tasks and steady assist for functional transfers with RW at ambulatory level. He requires vcs for LVAD mgt, however family has exhibited ability to provide this assist during therapy sessions. Spouse [name] [name] and [name] [name] are often present for d/c planning and therapy involvement. He is on track for LTG achievement. LB dressing goal has been upgraded to reflect progress. Continue POC.   Patient continues to demonstrate the following deficits: muscle weakness, decreased cardiorespiratoy endurance, decreased initiation, decreased awareness, decreased problem solving, decreased safety awareness and decreased memory and decreased sitting balance, decreased standing balance, decreased balance strategies and difficulty maintaining precautions and therefore will continue to benefit from skilled OT intervention to enhance overall performance with BADL.  Patient progressing toward long term goals..  Continue plan of care.  OT Short Term Goals Week 1:  OT Short Term Goal 1 (Week 1): Pt will complete toilet transfer with Min A and LRAD OT Short Term Goal 1 - Progress (Week 1): Met OT Short Term Goal 2 (Week 1): Pt will complete bathing with Min A and sit<stand level OT Short Term Goal 2 - Progress (Week 1): Met OT Short Term Goal 3 (Week 1): Pt will don shirt with supervision  OT Short Term Goal 3 - Progress (Week  1): Met Week 2:  OT Short Term Goal 1 (Week 2): STGs=LTGs due to ELOS   Skilled Therapeutic Interventions/Progress Updates:    Pt greeted supine in bed with c/o buttocks pain, however already medicated. Tx focus on activity tolerance, balance, functional transfers, and standing endurance during functional tasks. Started session with pt donning/doffing clean clothing items for assessment of BADL skills. He required vcs for safe sequencing and LVAD mgt. Steady assist for elevating LB garments over hips using RW. Pt declined keeping clothes on, opting for hospital gown instead. He doffed clothes with steady assist as well. Afterwards he ambulated to sink to complete oral care with steady assist and therapist managing LVAD lines. Pt completed handwashing in standing also. He then ambulated back to bed, per request. Pt left with all needs within reach at session exit.   Therapy Documentation Precautions:  Precautions Precautions: Sternal, Fall Precaution Comments: LVAD, sacral wound Restrictions Weight Bearing Restrictions: No Vital Signs: Therapy Vitals Pulse Rate: 66 Resp: 18 BP: (!) 73/55 Patient Position (if appropriate): Sitting Oxygen Therapy SpO2: 97 % O2 Device: Room Air Pain: Pt provided with rest breaks and instructed him on pressure relief positions to minimize discomfort    ADL: ADL ADL Comments: Please see functional navigator for ADL status     See Function Navigator for Current Functional Status.   Therapy/Group: Individual Therapy  Jax Kentner A Briar Sword 09/20/2017, 4:12 PM

## 2017-09-20 NOTE — Plan of Care (Signed)
OT POC adjusted 09/20/17

## 2017-09-20 NOTE — Progress Notes (Signed)
Physical Therapy Note  Patient Details  Name: [name] [name] MRN: [id number] Date of Birth: Feb 12, 1951 Today's Date: 09/20/2017    Attempted to see pt for make up time at 3pm but pt declined multiple times stating he/we we're "doing too much". Pt continued to decline despite therapist's encouragement. Will f/u per POC.   Waunita Schooner 09/20/2017, 3:06 PM

## 2017-09-20 NOTE — Progress Notes (Signed)
Physical Therapy Session Note  Patient Details  Name: [name] [name] MRN: [id number] Date of Birth: [birth year]  Today's Date: 09/20/2017 PT Individual Time: 0800-0845 PT Individual Time Calculation (min): 45 min   Short Term Goals: Week 2:  PT Short Term Goal 1 (Week 2): = LTG  Skilled Therapeutic Interventions/Progress Updates:    pt performs bed mobility, sit to stand and gait with RW throughout unit with supervision.  Pt able to gait 150', 200', 50' with standing rest breaks.  Gait without AD with min A, HHA x 20', limited by LE fatigue.  Standing therex for activity tolerance and strengthening with 3 x 5 hip abd, marching, heel raises and mini squats.  Step ups to 6'' step 3 x 5 for LE strengthening. Pt improving activity tolerance and strength.  Continues with decreased activity tolerance without AD.  Pt left in bed with needs at hand, alarm set.  Therapy Documentation Precautions:  Precautions Precautions: Sternal, Fall Precaution Comments: LVAD, sacral wound Restrictions Weight Bearing Restrictions: No Pain: Pain Assessment Pain Scale: 0-10 Pain Score: 0-No pain  Therapy/Group: Individual Therapy  Tahje Borawski 09/20/2017, 8:48 AM

## 2017-09-20 NOTE — Progress Notes (Signed)
Existing VAD dressing removed and site care performed using sterile technique. Drive line exit site cleaned with Chlora prep applicators x 2, allowed to dry, and gauze dressing with silver strip re-applied. Exit site uncorporated, the velour is fully implanted at exit site. Pt c/o burning with alcohol on areas under tape. Exit site with small amount tan drainage, last suture removed; no no redness or foul odor noted. Drive line anchor x 2 replaced. Will advance to every other day dressing changes.    Zada Girt RN, VAD Coordinator 24/7 VAD pager: [phone]

## 2017-09-20 NOTE — Progress Notes (Signed)
[place] for coumadin Indication: LVAD  Allergies  Allergen Reactions  . Penicillins Hives, Itching and Other (See Comments)    Has patient had a PCN reaction causing immediate rash, facial/tongue/throat swelling, SOB or lightheadedness with hypotension:# # Yes # # Has patient had a PCN reaction causing severe rash involving mucus membranes or skin necrosis: No Has patient had a PCN reaction that required hospitalization: No Has patient had a PCN reaction occurring within the last 10 years: No If all of the above answers are "NO", then may proceed with Cephalosporin use.     Patient Measurements: Height: 5\' 11"  (180.3 cm) Weight: 191 lb 12.8 oz (87 kg) IBW/kg (Calculated) : 75.3 Heparin Dosing Weight: 98.3 kg  Vital Signs: Temp: 97.4 F (36.3 C) (07/22 0552) Temp Source: Oral (07/22 0552) BP: 97/74 (07/22 0743) Pulse Rate: 72 (07/22 0743)  Labs: Recent Labs    09/18/17 0409 09/19/17 0340 09/20/17 0349  HGB  --   --  9.8*  HCT  --   --  32.3*  PLT  --   --  209  LABPROT 31.7* 26.0* 23.5*  INR 3.09 2.41 2.11  CREATININE  --   --  1.05    Estimated Creatinine Clearance: 72.7 mL/min (by C-G formula based on SCr of 1.05 mg/dL).  Assessment: 70 yoM s/p LVAD on 6/17 on warfarin per Rx. Patient previously therapeutic on 3 mg dosing. Patient had post-op anemia with downtrending plts, now stable, no bleeding.  INR trend down after dose held this weekend will give little boost - eating better  Goal of Therapy:  INR goal 2-2.5 Monitor platelets by anticoagulation protocol: Yes   Plan:   - Warfarin 3mg  x1tonight - Suspect pt will need ~2.5mg  daily  - Daily INR, CBC, LDH  Bonnita Nasuti Pharm.D. CPP, BCPS Clinical Pharmacist [phone] 09/20/2017 10:13 AM

## 2017-09-20 NOTE — Progress Notes (Addendum)
Patient ID: [name] [name], male   DOB: [birth year], 67 y.o.   MRN: [id number] P    Advanced Heart Failure Rounding Note  PCP-Cardiologist: No primary care provider on file.   Subjective:    Events 6/17 Underwent HM-3 implant -> Unable to close chest with RV failure and high intrathoracic pressure 6/18 Taken back to OR for evacuation of hematoma. NO RVAD needed. Left sternum open  08/19/17 Taken back to OR for sternal closure 6/22 VT => amiodarone started.  6/24 VT/VF=>urgent cardioversion. 6/25 VT  =>urgent cardioversion  6/27 Extubated 7/12 to CIR   INR 2.11 today. MAP 80s  Feeling good this am. Denies SOB, lightheadedness or dizziness walking halls. Remains hoarse. Still with pain from wound on buttocks. No fevers or chills.   LVAD Interrogation HM 3: Speed: 5300 Flow: 3.8 PI: 6.0 Power: 5.0. VAD interrogated personally. Parameters stable.     Objective:   Weight Range: 191 lb 12.8 oz (87 kg) Body mass index is 26.75 kg/m.   Vital Signs:   Temp:  [97.4 F (36.3 C)-97.8 F (36.6 C)] 97.4 F (36.3 C) (07/22 0552) Pulse Rate:  [45-84] 72 (07/22 0743) Resp:  [16-20] 18 (07/22 0552) BP: (84-103)/(56-91) 97/74 (07/22 0743) SpO2:  [97 %-100 %] 100 % (07/22 0743) Weight:  [191 lb 12.8 oz (87 kg)] 191 lb 12.8 oz (87 kg) (07/22 0552) Last BM Date: 09/18/17   MAP 80s  Weight change: Filed Weights   09/18/17 0448 09/19/17 0442 09/20/17 0552  Weight: 207 lb 3.7 oz (94 kg) 209 lb 7 oz (95 kg) 191 lb 12.8 oz (87 kg)   Intake/Output:   Intake/Output Summary (Last 24 hours) at 09/20/2017 0804 Last data filed at 09/19/2017 2235 Gross per 24 hour  Intake 480 ml  Output 1800 ml  Net -1320 ml   Physical Exam   General: Well appearing this am. NAD. Voice hoarse HEENT: Normal. anitcteric Neck: Supple, JVP 7-8 cm. Carotids OK.  Cardiac:  Mechanical heart sounds with LVAD hum present.  Lungs:  CTAB, normal effort.  No wheeze or stridor.  Abdomen:  NT, ND, no HSM. No bruits  or masses. +BS  LVAD exit site: Dressing dry and intact. No erythema or drainage. Stabilization device present and accurately applied. Driveline dressing changed daily per sterile technique. Extremities:  Warm and dry. No cyanosis, clubbing, rash, or edema. Dressing on buttock Neuro:  Alert & oriented x 3. Cranial nerves grossly intact. Moves all 4 extremities w/o difficulty. Affect pleasant     Telemetry   N/A.   Labs    CBC Recent Labs    09/20/17 0349  WBC 4.5  HGB 9.8*  HCT 32.3*  MCV 81.8  PLT 710   Basic Metabolic Panel Recent Labs    09/20/17 0349  NA 134*  K 3.7  CL 98  CO2 26  GLUCOSE 84  BUN 18  CREATININE 1.05  CALCIUM 8.9  MG 2.1   Liver Function Tests Recent Labs    09/20/17 0349  AST 26  ALT 23  ALKPHOS 87  BILITOT 0.9  PROT 7.9  ALBUMIN 2.5*   No results for input(s): LIPASE, AMYLASE in the last 72 hours. Cardiac Enzymes No results for input(s): CKTOTAL, CKMB, CKMBINDEX, TROPONINI in the last 72 hours.  BNP: BNP (last 3 results) Recent Labs    09/05/17 2327 09/13/17 0331 09/20/17 0349  BNP 86.1 53.3 56.0    ProBNP (last 3 results) Recent Labs    11/12/16 1559  PROBNP  388.0*     D-Dimer No results for input(s): DDIMER in the last 72 hours. Hemoglobin A1C No results for input(s): HGBA1C in the last 72 hours. Fasting Lipid Panel No results for input(s): CHOL, HDL, LDLCALC, TRIG, CHOLHDL, LDLDIRECT in the last 72 hours. Thyroid Function Tests No results for input(s): TSH, T4TOTAL, T3FREE, THYROIDAB in the last 72 hours.  Invalid input(s): FREET3  Other results:   Imaging    No results found.   Medications:     Scheduled Medications: . aspirin  81 mg Oral Daily  . bisacodyl  10 mg Oral Daily   Or  . bisacodyl  10 mg Rectal Daily  . Chlorhexidine Gluconate Cloth  6 each Topical QHS  . collagenase   Topical Daily  . famotidine  20 mg Oral BID  . insulin aspart  0-15 Units Subcutaneous TID WC  . insulin aspart   0-5 Units Subcutaneous QHS  . insulin detemir  25 Units Subcutaneous QHS  . magnesium oxide  400 mg Oral BID  . mouth rinse  15 mL Mouth Rinse BID  . multivitamin with minerals  1 tablet Oral Daily  . nystatin  5 mL Oral QID  . potassium chloride  40 mEq Oral Daily  . protein supplement shake  11 oz Oral BID BM  . sodium chloride flush  10-40 mL Intracatheter Q12H  . spironolactone  25 mg Oral Daily  . torsemide  60 mg Oral Daily  . vitamin C  500 mg Oral BID  . Warfarin - Pharmacist Dosing Inpatient   Does not apply q1800    Infusions: . lactated ringers      PRN Medications: acetaminophen, acetaminophen-codeine, alum & mag hydroxide-simeth, bisacodyl, clonazepam, diphenhydrAMINE, guaiFENesin-dextromethorphan, lactated ringers, levalbuterol, polyethylene glycol, prochlorperazine **OR** prochlorperazine **OR** prochlorperazine, RESOURCE THICKENUP CLEAR, sodium chloride flush, sodium phosphate, traZODone    Patient Profile   [name] [name] is a 67 y.o. 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   Directly admitted with persistent low cardiac output for milrinone initiation for home.  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 Acute/Chronic systolic CHF with biventricular failure-> cardiogenic shock: Echo  08/13/2017 EF 20-25%. s/p  Medtronic BiV ICD in place. Cath 12/18 with stable 1v CAD.  s/p HM-3 implant 6/17 under destination therapy. Unable to close chest due to high-intrathoracic pressures and RV failure. Taken back to OR 08/17/17 with evacuation of hematoma with improvement. Extubated 6/28.  Ramp ECHO 7/11 with speed increased to 5300.  - Volume status stable on exam.  - Continue torsemide 60 mg daily  - Continue spiro 25 mg daily.  2. 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 LDH 180. Ramp ECHO speed increased to 5300.   - VAD interrogated personally.  Parameters stable.   - Continue warfarin + ASA 81, INR 2.11 today.  - LDH 193 3. Acute on CKD II-III:  - Resolved.  4. [name]/VF: Recurrent VT on 6/22.  S/P VF 6/24 emergent bedside cardioversion. S/P VT 6/26 emergent bedside cardioversion. ICD turned back on.   - No change to current plan.   5. AFL/atrial fibrillation: S/p previous ablation. He is currently in atrial fibrillation.  - Rate controlled. Amio stopped.  - On coumadin. No bleeding.  6. [name]: Recent cath with stable CAD as above:  No s/s of ischemia.    7. [name]: Recent A1c 8.3 on 6/3.  - Continue current regimen. No change.  8. Anemia: Post-op.  Got 1 unit PRBCs 6/22 and 6/25. - Stable. Hgb 9.8 today.  9. Deconditioning: Severe, working with PT aggressively.   - No change to current plan.   10. Unstageable Pressure Ulcer: R and L buttock.  R buttock with necrosis noted. Continue santyl for selective debridement. Change daily. Continue air overlay. Needs to reposition every hour when in the chair. Keep R /L in bed. . May need a different chair cushion. Added hydrotherapy.   - WOC following. No change.   Doing well. Tentatively home 09/25/17 (Saturday)  I reviewed the LVAD parameters from today, and compared the results to the patient's prior recorded data.  No programming changes were made.  The LVAD is functioning within specified parameters.  The patient performs LVAD self-test daily.  LVAD interrogation was negative for any significant power changes, alarms or PI events/speed drops.  LVAD equipment check completed and is in good working order.  Back-up equipment present.   LVAD education done on emergency procedures and precautions and reviewed exit site care.   Length of Stay: [address]  Annamaria Helling  09/20/2017, 8:03 AM  Advanced Heart Failure Team Pager [phone] (M-F; 7a - 4p)  Please contact [facility] for night-coverage after hours (4p -7a ) and weekends on [email or web address]   Patient seen  and examined with the above-signed Advanced Practice Provider and/or Housestaff. I personally reviewed laboratory data, imaging studies and relevant notes. I independently examined the patient and formulated the important aspects of the plan. I have edited the note to reflect any of my changes or salient points. I have personally discussed the plan with the patient and/or family.  Progressing nicely. MAPs, VAD parameters and INR (2.11) all look good. Remains hoarse and may have damage to recurrent laryngeal nerve, if persists will need ENT eval as outpatient. Major issues remaining or need for continued PT and treatment of wound on buttock. Now getting hydrotherapy and WOC following. VAD interrogated personally. Parameters stable. Appreciate CIR care.   Glori Bickers, MD  10:25 PM

## 2017-09-20 NOTE — Progress Notes (Signed)
Speech Language Pathology Daily Session Note  Patient Details  Name: [name] [name] MRN: [id number] Date of Birth: May 09, 1950  Today's Date: 09/20/2017 SLP Individual Time: 0900-1000 SLP Individual Time Calculation (min): 60 min  Short Term Goals: Week 2: SLP Short Term Goal 1 (Week 2): Pt will consume dys 2 textures and honey thick liquids with Mod I use of swallowing precautions and minimal overt s/s of aspiration.  SLP Short Term Goal 2 (Week 2): Pt will consume therapeutic trials of ice chips with supervision cues for use of swallowing precautions and minimal overt s/s of aspiration.  SLP Short Term Goal 3 (Week 2): Pt will recall new information with supervision cues for use of external aids.   SLP Short Term Goal 4 (Week 2): Pt will complete semi-complex tasks with min cues for functional problem solving.   SLP Short Term Goal 5 (Week 2): Pt will return demonstration of EMST trainer with mod I and for 75 repetitions a self reported effort level of <7/10.  SLP Short Term Goal 6 (Week 2): Pt will increase vocal intensity to achieve intelligibility at the sentence level with min assist.   Skilled Therapeutic Interventions: Skilled treatment session focused on cognition and dysphagia goals. SLP facilitated session by providing skilled observation of pt consuming ice chips. Pt with no overt s/s of aspiration (however pt is known silent aspirator). However pt needs FEES later in week to fully assess swallow function prior to discharge. Pt's voice remains at current baseline even with increased breath support. SLP further facilitated session by providing complex medication management. Pt completed with Mod I. Pt also able to demonstrate divided attention for ~ 15 minutes with Mod I and was able to recall Pharmacist information with Mod I after 15 minutes. Pt was left upright sitting EOB with all needs within reach. Continue per current plan of care.      Function:  Eating Eating   Modified  Consistency Diet: No(Trials of ice chips with SLP) Eating Assist Level: Supervision or verbal cues           Cognition Comprehension Comprehension assist level: Understands complex 90% of the time/cues 10% of the time  Expression   Expression assist level: Expresses complex 90% of the time/cues < 10% of the time;Expresses complex ideas: With extra time/assistive device  Social Interaction Social Interaction assist level: Interacts appropriately with others - No medications needed.  Problem Solving Problem solving assist level: Solves complex 90% of the time/cues < 10% of the time  Memory Memory assist level: Recognizes or recalls 90% of the time/requires cueing < 10% of the time    Pain Pain Assessment Pain Scale: 0-10 Pain Score: 6  Pain Type: Acute pain Pain Location: Sacrum Pain Descriptors / Indicators: Aching;Discomfort Pain Frequency: Constant Pain Onset: On-going Pain Intervention(s): Medication (See eMAR)(given prior to hydrotherapy)  Therapy/Group: Individual Therapy  Bynum Mccullars 09/20/2017, 10:51 AM

## 2017-09-20 NOTE — Progress Notes (Signed)
[place] PHYSICAL MEDICINE & REHABILITATION     PROGRESS NOTE    Subjective/Complaints: Patient seen lying in  Bed this morning. Family at bedside. Patient states that he did not sleep well overnight, but that this is baseline. He states he had a good weekend. He has questions about when he can eat a regular diet.  ROS: denies CP, SOB, nausea, vomiting, diarrhea.  Objective:  No results found. Recent Labs    09/20/17 0349  WBC 4.5  HGB 9.8*  HCT 32.3*  PLT 209   Recent Labs    09/20/17 0349  NA 134*  K 3.7  CL 98  GLUCOSE 84  BUN 18  CREATININE 1.05  CALCIUM 8.9   CBG (last 3)  Recent Labs    09/19/17 1626 09/19/17 2200 09/20/17 0659  GLUCAP 118* 137* 81    Wt Readings from Last 3 Encounters:  09/20/17 87 kg (191 lb 12.8 oz)  09/10/17 97.8 kg (215 lb 8 oz)  07/22/17 103.9 kg (229 lb)     Intake/Output Summary (Last 24 hours) at 09/20/2017 0941 Last data filed at 09/20/2017 0900 Gross per 24 hour  Intake 480 ml  Output 1700 ml  Net -1220 ml    Vital Signs: Blood pressure 97/74, pulse 72, temperature (!) 97.4 F (36.3 C), temperature source Oral, resp. rate 18, height 5\' 11"  (1.803 m), weight 87 kg (191 lb 12.8 oz), SpO2 100 %. Physical Exam:  Constitutional: No distress . Vital signs reviewed. HENT: Normocephalic.  Atraumatic. Eyes: EOMI. No discharge. Cardiovascular: RRR. No JVD. Respiratory: CTA Bilaterally. Normal effort. GI: BS +. Non-distended. Musc: No edema or tenderness in extremities. Neurological: He is alert and oriented  dysphonia Motor: B/l UE 4/5 prox to distal.  B/l LE: 4-/5 HF, 4/5 KE and 4+/5 ADF/PF.  Skin: Skin is warm. Intact. Psychiatric: pleasant and cooperative   Assessment/Plan: 1. Functional deficits secondary to debility after CHF/LVAD/VDRF which require 2hr per day over 7 days of interdisciplinary therapy in a comprehensive inpatient rehab setting. Physiatrist is providing close team supervision and 24 hour  management of active medical problems listed below. Physiatrist and rehab team continue to assess barriers to discharge/monitor patient progress toward functional and medical goals.  Function:  Bathing Bathing position   Position: Sitting EOB  Bathing parts Body parts bathed by patient: Right arm, Left arm, Chest, Abdomen, Front perineal area, Right upper leg, Left upper leg, Right lower leg, Left lower leg Body parts bathed by helper: Buttocks  Bathing assist Assist Level: Touching or steadying assistance(Pt > 75%)      Upper Body Dressing/Undressing Upper body dressing   What is the patient wearing?: Hospital gown     Pull over shirt/dress - Perfomed by patient: Thread/unthread right sleeve, Thread/unthread left sleeve, Put head through opening, Pull shirt over trunk          Upper body assist Assist Level: Set up      Lower Body Dressing/Undressing Lower body dressing   What is the patient wearing?: Non-skid slipper socks     Pants- Performed by patient: Thread/unthread right pants leg, Pull pants up/down Pants- Performed by helper: Thread/unthread left pants leg Non-skid slipper socks- Performed by patient: Don/doff right sock, Don/doff left sock Non-skid slipper socks- Performed by helper: Don/doff right sock, Don/doff left sock                  Lower body assist        Toileting Toileting Toileting activity did not  occur: No continent bowel/bladder event Toileting steps completed by patient: Adjust clothing prior to toileting, Performs perineal hygiene, Adjust clothing after toileting Toileting steps completed by helper: Performs perineal hygiene, Adjust clothing after toileting Toileting Assistive Devices: Grab bar or rail  Toileting assist Assist level: Touching or steadying assistance (Pt.75%)   Transfers Chair/bed transfer Chair/bed transfer activity did not occur: Refused Chair/bed transfer method: Ambulatory Chair/bed transfer assist level: Touching  or steadying assistance (Pt > 75%) Chair/bed transfer assistive device: Armrests, Medical sales representative Ambulation activity did not occur: Refused   Max distance: 100' Assist level: Supervision or Psychologist, clinical activity did not occur: Refused        Cognition Comprehension Comprehension assist level: Understands complex 90% of the time/cues 10% of the time  Expression Expression assist level: Expresses complex 90% of the time/cues < 10% of the time  Social Interaction Social Interaction assist level: Interacts appropriately with others - No medications needed.  Problem Solving Problem solving assist level: Solves complex 90% of the time/cues < 10% of the time  Memory Memory assist level: Recognizes or recalls 90% of the time/requires cueing < 10% of the time   Medical Problem List and Plan:  1. Functional and mobility deficits secondary to debility after congestive heart failure/LVAD/VDRF and multiple other medical issues   Continue CIR  Notes reviewed- CHF exacerbation with LVAD placement, labs reviewed -decreased therapy to 2 hrs per day over 7 days intensity   2. DVT Prophylaxis/Anticoagulation: Pharmaceutical: Coumadin   INR therapeutic on 7/22 3. Pain Management: tylenol prn.   -T#3 for hydrotherapy 4. Mood: LCSW to follow for evaluation and support.  5. Neuropsych: This patient is ?fully capable of making decisions on his own behalf.  6. Deep tissue injury bilateral buttocks/Skin/Wound Care: Air mattress overlay.     -Hydrotherapy to buttocks M->Sa. Area improving  -T#3 as above 7. Fluids/Electrolytes/Nutrition: Strict I/Os. Continue nutritional supplement to promote wound healing  8. T2DM: Monitor BS ac/hs. Continue Lantus   Labile and 7/22 9. A fib: Off amiodarone.   10. 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 Started on [facility] 7/12. Continue Spironolactone and digoxin daily. Monitor renal status serially.  Filed Weights    09/18/17 0448 09/19/17 0442 09/20/17 0552  Weight: 94 kg (207 lb 3.7 oz) 95 kg (209 lb 7 oz) 87 kg (191 lb 12.8 oz)   ? Reliability 11. [name]/VF: S/p emergent cardioversion 6/24 and 6/26-->ICD turned on 7/12. To keep K> 4.0 and Mg> 2.0   Potassium supplement increased on 7/22 12. ABLA: Improved with transfusion.    Hemoglobin 9.8 on 7/22 13. Dysphagia: Continue dysphagia 2, honey liquids with chin tuck.   -continue to push po liquids--doing fairly well 14. LVAD implant 6/17: On coumadin and low dose ASA.   -Education ongoing with patient and wife. feeling more comfortable with process  -Dressing changes per VAD coordinator or trained caregiver.  15. Hyponatremia  Sodium 134 and 7/22  Continue to monitor  LOS (Days) 10 A FACE TO FACE EVALUATION WAS PERFORMED  Lakiyah Arntson Lorie Phenix, MD 09/20/2017 9:41 AM

## 2017-09-20 NOTE — Progress Notes (Signed)
Physical Therapy Wound Treatment Patient Details  Name: [name] [name] MRN: [id number] Date of Birth: 05-Oct-1950  Today's Date: 09/20/2017 PT Individual Time: 1111-1150 PT Individual Time Calculation (min): 39 min   Subjective  Subjective: No c/o's Patient and Family Stated Goals: Heal wound Prior Treatments: Wet to dry dressing changes with Santyl  Pain Score: Pain Score: 2   Wound Assessment  Pressure Injury 08/27/17 Unstageable - Full thickness tissue loss in which the base of the ulcer is covered by slough (yellow, tan, gray, green or brown) and/or eschar (tan, brown or black) in the wound bed. 8.5 x 6 (90% darkened 5% yellow and 5% pink (Active)  Wound Image   09/20/2017 11:28 AM  Dressing Type Gauze (Comment);Foam;Barrier Film (skin prep);Moist to dry 09/20/2017 11:28 AM  Dressing Clean;Dry;Intact;Changed 09/20/2017 11:28 AM  Dressing Change Frequency Daily 09/20/2017 11:28 AM  State of Healing Early/partial granulation 09/20/2017 11:28 AM  Site / Wound Assessment Pink;Yellow 09/20/2017 11:28 AM  % Wound base Red or Granulating 45% 09/20/2017 11:28 AM  % Wound base Yellow/Fibrinous Exudate 55% 09/20/2017 11:28 AM  % Wound base Black/Eschar 0% 09/20/2017 11:28 AM  % Wound base Other/Granulation Tissue (Comment) 0% 09/20/2017 11:28 AM  Peri-wound Assessment Intact 09/20/2017 11:28 AM  Wound Length (cm) 9.1 cm 09/20/2017 11:28 AM  Wound Width (cm) 7.5 cm 09/20/2017 11:28 AM  Wound Depth (cm) 0.2 cm 09/20/2017 11:28 AM  Wound Surface Area (cm^2) 68.25 cm^2 09/20/2017 11:28 AM  Wound Volume (cm^3) 13.65 cm^3 09/20/2017 11:28 AM  Tunneling (cm) 0 09/10/2017 10:51 AM  Undermining (cm) 0 09/10/2017 10:51 AM  Margins Unattached edges (unapproximated) 09/20/2017 11:28 AM  Drainage Amount Minimal 09/20/2017 11:28 AM  Drainage Description Purulent;No odor 09/20/2017 11:28 AM  Treatment Hydrotherapy (Pulse lavage);Debridement (Selective) 09/20/2017 11:28 AM   Santyl applied to wound bed prior to applying  dressing.    Hydrotherapy Pulsed lavage therapy - wound location: R buttock Pulsed Lavage with Suction (psi): 8 psi(8-12) Pulsed Lavage with Suction - Normal Saline Used: 1000 mL Pulsed Lavage Tip: Tip with splash shield Selective Debridement Selective Debridement - Location: R buttock Selective Debridement - Tools Used: Forceps;Scissors Selective Debridement - Tissue Removed: minimal yellow necrotic tissue   Wound Assessment and Plan  Wound Therapy - Assess/Plan/Recommendations Wound Therapy - Clinical Statement: Wound continues to have increased pink tissue throughout yellow area of the wound. Continue hydrotherapy for removal of necrotic tissue and to promote wound healing. Wound Therapy - Functional Problem List: Decreased tolerance for OOB and position changes Factors Delaying/Impairing Wound Healing: Diabetes Mellitus;Immobility;Multiple medical problems Hydrotherapy Plan: Debridement;Dressing change;Patient/family education;Pulsatile lavage with suction Wound Therapy - Frequency: 6X / week Wound Therapy - Follow Up Recommendations: [place] Wound Plan: See above  Wound Therapy Goals- Improve the function of patient's integumentary system by progressing the wound(s) through the phases of wound healing (inflammation - proliferation - remodeling) by: Decrease Necrotic Tissue to: 40 Decrease Necrotic Tissue - Progress: Progressing toward goal Increase Granulation Tissue to: 60 Increase Granulation Tissue - Progress: Progressing toward goal Goals/treatment plan/discharge plan were made with and agreed upon by patient/family: Yes Time For Goal Achievement: 7 days Wound Therapy - Potential for Goals: Good  Goals will be updated until maximal potential achieved or discharge criteria met.  Discharge criteria: when goals achieved, discharge from hospital, MD decision/surgical intervention, no progress towards goals, refusal/missing three consecutive treatments without  notification or medical reason.  GP     Shary Decamp [place] 09/20/2017, 12:26 PM [employer] PT [phone]

## 2017-09-20 NOTE — Plan of Care (Signed)
  Problem: Consults Goal: RH GENERAL PATIENT EDUCATION Description See Patient Education module for education specifics. Outcome: Progressing Goal: Skin Care Protocol Initiated - if Braden Score 18 or less Description If consults are not indicated, leave blank or document N/A Outcome: Progressing Goal: Nutrition Consult-if indicated Outcome: Progressing   Problem: RH BOWEL ELIMINATION Goal: RH STG MANAGE BOWEL WITH ASSISTANCE Description STG Manage Bowel with min.Assistance.  Outcome: Progressing Goal: RH STG MANAGE BOWEL W/MEDICATION W/ASSISTANCE Description STG Manage Bowel with Medication with min.Assistance.  Outcome: Progressing   Problem: RH BLADDER ELIMINATION Goal: RH STG MANAGE BLADDER WITH ASSISTANCE Description STG Manage Bladder With min.Assistance  Outcome: Progressing   Problem: RH SKIN INTEGRITY Goal: RH STG SKIN FREE OF INFECTION/BREAKDOWN Description With Mod. assist.  Outcome: Progressing Goal: RH STG MAINTAIN SKIN INTEGRITY WITH ASSISTANCE Description STG Maintain Skin Integrity With Mod.Assistance.  Outcome: Progressing Goal: RH STG ABLE TO PERFORM INCISION/WOUND CARE W/ASSISTANCE Description STG Able To Perform Incision/Wound Care With Mod.Assistance.  Outcome: Progressing   Problem: RH SAFETY Goal: RH STG ADHERE TO SAFETY PRECAUTIONS W/ASSISTANCE/DEVICE Description STG Adhere to Safety Precautions With Mod.Assistance/Device.  Outcome: Progressing Goal: RH STG DECREASED RISK OF FALL WITH ASSISTANCE Description STG Decreased Risk of Fall With Mod.Assistance.  Outcome: Progressing   Problem: RH PAIN MANAGEMENT Goal: RH STG PAIN MANAGED AT OR BELOW PT'S PAIN GOAL Description Less than3,on 1 to 10 scale.  Outcome: Progressing

## 2017-09-21 ENCOUNTER — Inpatient Hospital Stay (HOSPITAL_COMMUNITY): Payer: PPO | Admitting: Occupational Therapy

## 2017-09-21 ENCOUNTER — Ambulatory Visit (HOSPITAL_COMMUNITY): Payer: PPO

## 2017-09-21 ENCOUNTER — Inpatient Hospital Stay (HOSPITAL_COMMUNITY): Payer: PPO | Admitting: Speech Pathology

## 2017-09-21 ENCOUNTER — Inpatient Hospital Stay (HOSPITAL_COMMUNITY): Payer: PPO | Admitting: Physical Therapy

## 2017-09-21 DIAGNOSIS — G479 Sleep disorder, unspecified: Secondary | ICD-10-CM

## 2017-09-21 LAB — GLUCOSE, CAPILLARY
Glucose-Capillary: 91 mg/dL (ref 70–99)
Glucose-Capillary: 123 mg/dL — ABNORMAL HIGH (ref 70–99)
Glucose-Capillary: 111 mg/dL — ABNORMAL HIGH (ref 70–99)
Glucose-Capillary: 164 mg/dL — ABNORMAL HIGH (ref 70–99)

## 2017-09-21 LAB — PROTIME-INR
INR: 2.08
PROTHROMBIN TIME: 23.2 s — AB (ref 11.4–15.2)

## 2017-09-21 MED ORDER — WARFARIN SODIUM 3 MG PO TABS
3.0000 mg | ORAL_TABLET | Freq: Once | ORAL | Status: AC
  Administered 2017-09-21: 3 mg via ORAL
  Filled 2017-09-21: qty 1

## 2017-09-21 MED ORDER — POTASSIUM CHLORIDE 20 MEQ/15ML (10%) PO SOLN
30.0000 meq | Freq: Two times a day (BID) | ORAL | Status: DC
  Administered 2017-09-21 – 2017-09-25 (×8): 30 meq via ORAL
  Filled 2017-09-21 (×8): qty 30

## 2017-09-21 MED ORDER — TRAZODONE HCL 50 MG PO TABS
50.0000 mg | ORAL_TABLET | Freq: Every day | ORAL | Status: DC
  Administered 2017-09-21 – 2017-09-24 (×4): 50 mg via ORAL
  Filled 2017-09-21 (×4): qty 1

## 2017-09-21 NOTE — Patient Care Conference (Signed)
Inpatient RehabilitationTeam Conference and Plan of Care Update Date: 09/21/2017   Time: 10:40 AM    Patient Name: [name] [name]      Medical Record Number: [id number]  Date of Birth: 16-Apr-1950 Sex: Male         Room/Bed: 4W09C/4W09C-01 Payor Info: Payor: [employer] / Plan: [employer] / Product Type: *No Product type* /    Admitting Diagnosis: CHF  LVAD  Admit Date/Time:  09/10/2017  4:15 PM Admission Comments: No comment available   Primary Diagnosis:  <principal problem not specified> Principal Problem: <principal problem not specified>  Patient Active Problem List   Diagnosis Date Noted  . Sleep disturbance   . Hyponatremia   . Dysphagia   . Diabetes mellitus type 2 in nonobese (HCC)   . Labile blood glucose   . Debility 09/10/2017  . CHF (congestive heart failure) ([place])   . Type 2 diabetes mellitus with retinopathy, with long-term current use of insulin ([place])   . Stage 3 chronic kidney disease ([place])   . Cardiogenic shock ([place])   . Acute respiratory failure ([place])   . Tachypnea   . Leukocytosis   . Acute blood loss anemia   . Pressure injury of skin 08/27/2017  . Acute respiratory failure with hypoxemia ([place])   . Acute pulmonary edema (HCC)   . Metabolic alkalosis   . Presence of left ventricular assist device (LVAD) ([place])   . S/P TVR (tricuspid valve repair) 08/17/2017  . Goals of care, counseling/discussion   . Palliative care encounter   . Acute on chronic systolic (congestive) heart failure ([place]) 08/11/2017  . Acute on chronic combined systolic and diastolic CHF (congestive heart failure) ([place]) 11/19/2016  . Acute on chronic left and right systolic w/ diastolic heart failure, NYHA class 2 ([place]) 11/18/2016  . Hx of atrial flutter 11/18/2016  . CAD (coronary artery disease)-RCA-T, 70% PL (off CFX), 99% Prox LAD/90% Dist LAD, S/P TAXUS stent x 2 11/18/2016  . HTN (hypertension) 11/18/2016  . Diabetes mellitus with complication ([place]) 17/00/1749   . Thrombocytopenia ([place]) 11/18/2016  . Bilateral lower extremity edema 11/12/2016  . Diabetic polyneuropathy associated with diabetes mellitus due to underlying condition ([place]) 05/29/2015  . Chronic systolic CHF (congestive heart failure) ([place]) 02/05/2014  . Medtronic biventricular ICD, serial number  [id number] H  02/05/2014  . CAD (coronary artery disease), native coronary artery 01/08/2014  . Chronic renal disease, stage III ([place]) 01/08/2014  . Heart failure ([place]) 12/16/2013  . Mitral regurgitation 04/17/2013  . Chronic systolic dysfunction of left ventricle 06/29/2012  . Atrial flutter ([place]) 06/03/2012  . Acute kidney injury (nontraumatic) ([place]) 05/31/2012  . Chronic anticoagulation   . Acute on chronic systolic heart failure ([place]) 05/21/2012  . Nausea with vomiting 05/18/2012  . PAF (paroxysmal atrial fibrillation) ([place]) 05/17/2012  . Orthopnea 05/17/2012  . TIA (transient ischemic attack) 05/29/2010  . CHEST PAIN 11/13/2008  . VENTRICULAR TACHYCARDIA 08/14/2008  . Automatic implantable cardioverter-defibrillator in situ 08/14/2008  . Type 2 diabetes with nephropathy ([place]) 08/11/2008  . DYSLIPIDEMIA 08/11/2008  . Cardiomyopathy, ischemic 08/11/2008  . Disorder resulting from impaired renal function 08/11/2008  . HYPERCHOLESTEROLEMIA 04/29/2006  . HYPERTENSION, BENIGN SYSTEMIC 04/29/2006  . MYOCARDIAL INFARCTION, OLD 04/29/2006  . Coronary atherosclerosis 04/29/2006  . NEPHROLITHIASIS 04/29/2006    Expected Discharge Date: Expected Discharge Date: 09/25/17  Team Members Present: Physician leading conference: Dr. Delice Lesch Social Worker Present: Alfonse Alpers, LCSW Nurse Present: Arelia Sneddon, RN PT Present: Roderic Ovens, PT OT Present:  Cherylynn Ridges, OT SLP Present: Weston Anna, SLP PPS Coordinator present : Daiva Nakayama, RN, CRRN     Current Status/Progress Goal Weekly Team Focus  Medical   Functional and mobility deficits secondary to debility after  congestive heart failure/LVAD/VDRF and multiple other medical issues   Improve endurance, mobility, self-care, DM, sleep, electrolytes  See above   Bowel/Bladder   continent bowel and bladder, LBM 09/18/17   remain continent of bowel and bladder, continue to maintain a regular bowel pattern   assist patient with toileting needs PRN    Swallow/Nutrition/ Hydration   Dys 2 textures with honey thick liquids; voicing remains unchanged therefore concern for silent aspiration persists  Mod I  FEES to assess progress towards diet advancement    ADL's   supervision/CGA sit<>stand, min A BADL tasks, CGA/supervision functional ambulation  Supervision  pt/family education, dc planning, BADL training with VAD, pressure relief, endurance, generl strengthening   Mobility   supervision  supervision overall  family ed, d/c planning   Communication   Pt remains aphonic   supervision   RMT and strategies to improve glottal closure and increased voicing   Safety/Cognition/ Behavioral Observations  Min A- Supervision   Supervision   continue to address higher level cognitive goals to work towards safe d/c    Pain   no complaints of pain   pain = 0  assess pain q shift and PRN    Skin   unstageable to bottom hydrotherapy santyl wet to dry dressing, state           Rehab Goals Patient on target to meet rehab goals: Yes Rehab Goals Revised: none *See Care Plan and progress notes for long and short-term goals.     Barriers to Discharge  Current Status/Progress Possible Resolutions Date Resolved   Physician    Medical stability;Other (comments)  LVAD  See above  Therapies, follow labs, optimize DM meds, heart failure recs, optimize sleep      Nursing                  PT                    OT                  SLP                SW                Discharge Planning/Teaching Needs:  Plan for pt to d/c home with wife as primary caregiver and family to provide 24/7 assistance.  Teaching is  underway and his going well with wife and one dtr, per team.   Team Discussion:  Pt with up and down blood sugars and MD will adjust meds as needed.  MD watching labs, electrolytes.  Sleep has always been poor and MD is starting medication.  Heart failure team is following, as well.  Pt is stable with LVAD, per RN, and family does well with it.  Trying to get pt up to speed on LVAD, as well.  Pt is medicated prior to hydrotherapy.  Pt is at supervision level with PT and family does well.  OT stated that pt does well with ADLS, but they are trying to have family do less so pt can do more.  ST is seeing pt for swallowing issues and he will have a FEES tomorrow.  Vocal cords are likely the issue.  Pt can  follow strategies; is on a D2, honey thick liquids with a chin tuck.  He is dysphonic.  Revisions to Treatment Plan:  none    Continued Need for Acute Rehabilitation Level of Care: The patient requires daily medical management by a physician with specialized training in physical medicine and rehabilitation for the following conditions: Daily direction of a multidisciplinary physical rehabilitation program to ensure safe treatment while eliciting the highest outcome that is of practical value to the patient.: Yes Daily medical management of patient stability for increased activity during participation in an intensive rehabilitation regime.: Yes Daily analysis of laboratory values and/or radiology reports with any subsequent need for medication adjustment of medical intervention for : Cardiac problems;Wound care problems;Other;Diabetes problems  Devontre Siedschlag, Silvestre Mesi 09/21/2017, 12:11 PM

## 2017-09-21 NOTE — Progress Notes (Signed)
Physical Therapy Wound Treatment Patient Details  Name: [name] [name] MRN: [id number] Date of Birth: 1950-06-26  Today's Date: 09/21/2017 PT Individual Time: 1122-1142 PT Individual Time Calculation (min): 20 min   Subjective  Subjective: Pt asking how wound was looking Patient and Family Stated Goals: Heal wound Prior Treatments: Wet to dry dressing changes with Santyl  Pain Score:    Wound Assessment  Pressure Injury 08/27/17 Unstageable - Full thickness tissue loss in which the base of the ulcer is covered by slough (yellow, tan, gray, green or brown) and/or eschar (tan, brown or black) in the wound bed. 8.5 x 6 (90% darkened 5% yellow and 5% pink (Active)  Dressing Type Gauze (Comment);Foam;Barrier Film (skin prep);Moist to dry 09/21/2017  3:00 PM  Dressing Clean;Dry;Intact;Changed 09/21/2017  3:00 PM  Dressing Change Frequency Daily 09/21/2017  3:00 PM  State of Healing Early/partial granulation 09/21/2017  3:00 PM  Site / Wound Assessment Pink;Yellow 09/21/2017  3:00 PM  % Wound base Red or Granulating 45% 09/21/2017  3:00 PM  % Wound base Yellow/Fibrinous Exudate 55% 09/21/2017  3:00 PM  % Wound base Black/Eschar 0% 09/21/2017  3:00 PM  % Wound base Other/Granulation Tissue (Comment) 0% 09/21/2017  3:00 PM  Peri-wound Assessment Intact 09/21/2017  3:00 PM  Wound Length (cm) 9.1 cm 09/20/2017 11:28 AM  Wound Width (cm) 7.5 cm 09/20/2017 11:28 AM  Wound Depth (cm) 0.2 cm 09/20/2017 11:28 AM  Wound Surface Area (cm^2) 68.25 cm^2 09/20/2017 11:28 AM  Wound Volume (cm^3) 13.65 cm^3 09/20/2017 11:28 AM  Tunneling (cm) 0 09/10/2017 10:51 AM  Undermining (cm) 0 09/10/2017 10:51 AM  Margins Unattached edges (unapproximated) 09/21/2017  3:00 PM  Drainage Amount Moderate 09/21/2017  3:00 PM  Drainage Description Purulent;No odor 09/21/2017  3:00 PM  Treatment Hydrotherapy (Pulse lavage);Packing (Saline gauze) 09/21/2017  3:00 PM   Santyl applied to wound bed prior to applying dressing.     Hydrotherapy Pulsed lavage therapy - wound location: R buttock Pulsed Lavage with Suction (psi): 8 psi(8-12) Pulsed Lavage with Suction - Normal Saline Used: 1000 mL Pulsed Lavage Tip: Tip with splash shield   Wound Assessment and Plan  Wound Therapy - Assess/Plan/Recommendations Wound Therapy - Clinical Statement: Wound continues to have increased pink tissue throughout yellow area of the wound. Continue hydrotherapy for removal of necrotic tissue and to promote wound healing. Necrotic tissue adhering to base of wound. Unable to remove with selective debridement. Wound Therapy - Functional Problem List: Decreased tolerance for OOB and position changes Factors Delaying/Impairing Wound Healing: Diabetes Mellitus;Immobility;Multiple medical problems Hydrotherapy Plan: Debridement;Dressing change;Patient/family education;Pulsatile lavage with suction Wound Therapy - Frequency: 6X / week Wound Therapy - Follow Up Recommendations: [place] Wound Plan: See above  Wound Therapy Goals- Improve the function of patient's integumentary system by progressing the wound(s) through the phases of wound healing (inflammation - proliferation - remodeling) by: Decrease Necrotic Tissue to: 40 Decrease Necrotic Tissue - Progress: Progressing toward goal Increase Granulation Tissue to: 60 Increase Granulation Tissue - Progress: Progressing toward goal  Goals will be updated until maximal potential achieved or discharge criteria met.  Discharge criteria: when goals achieved, discharge from hospital, MD decision/surgical intervention, no progress towards goals, refusal/missing three consecutive treatments without notification or medical reason.  GP     Shary Decamp [place] 09/21/2017, 3:35 PM [employer] PT [phone]

## 2017-09-21 NOTE — Progress Notes (Signed)
[place] for coumadin Indication: LVAD  Allergies  Allergen Reactions  . Penicillins Hives, Itching and Other (See Comments)    Has patient had a PCN reaction causing immediate rash, facial/tongue/throat swelling, SOB or lightheadedness with hypotension:# # Yes # # Has patient had a PCN reaction causing severe rash involving mucus membranes or skin necrosis: No Has patient had a PCN reaction that required hospitalization: No Has patient had a PCN reaction occurring within the last 10 years: No If all of the above answers are "NO", then may proceed with Cephalosporin use.     Patient Measurements: Height: 5\' 11"  (180.3 cm) Weight: 211 lb 10.3 oz (96 kg) IBW/kg (Calculated) : 75.3 Heparin Dosing Weight: 98.3 kg  Vital Signs: Temp: 98.4 F (36.9 C) (07/23 0553) Temp Source: Oral (07/23 0553) BP: 91/80 (07/23 0553) Pulse Rate: 71 (07/23 0553)  Labs: Recent Labs    09/19/17 0340 09/20/17 0349 09/21/17 0409  HGB  --  9.8*  --   HCT  --  32.3*  --   PLT  --  209  --   LABPROT 26.0* 23.5* 23.2*  INR 2.41 2.11 2.08  CREATININE  --  1.05  --     Estimated Creatinine Clearance: 80.7 mL/min (by C-G formula based on SCr of 1.05 mg/dL).  Assessment: 72 yoM s/p LVAD on 6/17 on warfarin per Rx. Patient previously therapeutic on 3 mg dosing. Patient had post-op anemia with downtrending plts, now stable, no bleeding.  INR trend down after dose held this weekend will give little boost - eating better  Goal of Therapy:  INR goal 2-2.5 Monitor platelets by anticoagulation protocol: Yes   Plan:   - Warfarin 3mg  again tonight - Suspect pt will need ~2.5mg  daily on discharge - Daily INR, CBC, LDH  Erin Hearing PharmD., BCPS Clinical Pharmacist 09/21/2017 12:45 PM

## 2017-09-21 NOTE — Progress Notes (Signed)
Occupational Therapy Session Note  Patient Details  Name: [name] [name] MRN: [id number] Date of Birth: 05/10/1950  Today's Date: 09/21/2017 OT Individual Time: 0800-0900 OT Individual Time Calculation (min): 60 min   Skilled Therapeutic Interventions/Progress Updates:    Pt greeted seated EOB with trained caregiver present throughout session. Second OT also present for beginning of session for battery change. Had pt practice changing batteries over from the wall seated EOB. Min verbal cues to pull straight out and not twist. Pt requests to bathe today. Bathing completed seated EOB with multiple sit<>stands at [place]. CGA for balance when reaching to wash peri-area and legs. Pt's spouse assisted with washing buttocks 2/2 sacral wound. Pt donned non-skid socks at EOB with set-up A, then reported need for bathroom and ambulated to bathroom with RW and CGA. Pt voided bladder with small smear of BM. Pt's wife performed peri-care despite encouragement from OT for pt to try. Pt returned to sitting EOB in similar fashion. OT and pt's spouse/caregiver adjusted battery straps for pt's comfort as he felt straps were too big. Pt drank some thickened water at EOB with coughing, then returned to supine with supervision. Pt left semi-reclined in bed with needs met and call bell in reach.    Therapy Documentation Precautions:  Precautions Precautions: Sternal, Fall Precaution Comments: LVAD, sacral wound Restrictions Weight Bearing Restrictions: No Pain:   none/denies pain ADL: ADL ADL Comments: Please see functional navigator for ADL status  See Function Navigator for Current Functional Status.   Therapy/Group: Individual Therapy  Valma Cava 09/21/2017, 9:23 AM

## 2017-09-21 NOTE — Progress Notes (Addendum)
[facility] PHYSICAL MEDICINE & REHABILITATION     PROGRESS NOTE    Subjective/Complaints: Patient seen sitting up at the edge of the bed this morning. He states he did not sleep well overnight as usual. He denies complaints.  ROS: denies CP, SOB, nausea, vomiting, diarrhea.  Objective:  No results found. Recent Labs    09/20/17 0349  WBC 4.5  HGB 9.8*  HCT 32.3*  PLT 209   Recent Labs    09/20/17 0349  NA 134*  K 3.7  CL 98  GLUCOSE 84  BUN 18  CREATININE 1.05  CALCIUM 8.9   CBG (last 3)  Recent Labs    09/20/17 1632 09/20/17 2147 09/21/17 0710  GLUCAP 95 146* 91    Wt Readings from Last 3 Encounters:  09/21/17 96 kg (211 lb 10.3 oz)  09/10/17 97.8 kg (215 lb 8 oz)  07/22/17 103.9 kg (229 lb)     Intake/Output Summary (Last 24 hours) at 09/21/2017 0855 Last data filed at 09/21/2017 0093 Gross per 24 hour  Intake 1560 ml  Output 1700 ml  Net -140 ml    Vital Signs: Blood pressure 91/80, pulse 71, temperature 98.4 F (36.9 C), temperature source Oral, resp. rate 18, height 5\' 11"  (1.803 m), weight 96 kg (211 lb 10.3 oz), SpO2 91 %. Physical Exam:  Constitutional: No distress . Vital signs reviewed. HENT: Normocephalic.  Atraumatic. Eyes: EOMI. No discharge. Cardiovascular: RRR. No JVD. Respiratory: CTA bilaterally. Normal effort. GI: BS +. Non-distended. Musc: No edema or tenderness in extremities. Neurological: He is alert and oriented  dysphonia Motor: B/l UE 4/5 prox to distal. (stable) B/l LE: 4-/5 HF, 4/5 KE and 4+/5 ADF/PF. (stable) Skin: Skin is warm. Intact. Psychiatric: pleasant and cooperative   Assessment/Plan: 1. Functional deficits secondary to debility after CHF/LVAD/VDRF which require 2hr per day over 7 days of interdisciplinary therapy in a comprehensive inpatient rehab setting. Physiatrist is providing close team supervision and 24 hour management of active medical problems listed below. Physiatrist and rehab team continue to  assess barriers to discharge/monitor patient progress toward functional and medical goals.  Function:  Bathing Bathing position   Position: Sitting EOB  Bathing parts Body parts bathed by patient: Right arm, Left arm, Chest, Abdomen, Front perineal area, Right upper leg, Left upper leg, Right lower leg, Left lower leg Body parts bathed by helper: Buttocks  Bathing assist Assist Level: Touching or steadying assistance(Pt > 75%)      Upper Body Dressing/Undressing Upper body dressing   What is the patient wearing?: Pull over shirt/dress     Pull over shirt/dress - Perfomed by patient: Thread/unthread right sleeve, Thread/unthread left sleeve, Put head through opening, Pull shirt over trunk          Upper body assist Assist Level: Set up      Lower Body Dressing/Undressing Lower body dressing   What is the patient wearing?: Underwear, Pants, Socks, Shoes Underwear - Performed by patient: Thread/unthread right underwear leg, Thread/unthread left underwear leg, Pull underwear up/down   Pants- Performed by patient: Thread/unthread right pants leg, Pull pants up/down, Thread/unthread left pants leg Pants- Performed by helper: Thread/unthread left pants leg Non-skid slipper socks- Performed by patient: Don/doff right sock, Don/doff left sock Non-skid slipper socks- Performed by helper: Don/doff right sock, Don/doff left sock Socks - Performed by patient: Don/doff right sock, Don/doff left sock   Shoes - Performed by patient: Don/doff right shoe, Don/doff left shoe, Fasten right, Fasten left  Lower body assist Assist for lower body dressing: Touching or steadying assistance (Pt > 75%)      Toileting Toileting Toileting activity did not occur: No continent bowel/bladder event Toileting steps completed by patient: Adjust clothing prior to toileting, Performs perineal hygiene, Adjust clothing after toileting Toileting steps completed by helper: Performs perineal hygiene,  Adjust clothing after toileting Toileting Assistive Devices: Grab bar or rail  Toileting assist Assist level: Touching or steadying assistance (Pt.75%)   Transfers Chair/bed transfer Chair/bed transfer activity did not occur: Refused Chair/bed transfer method: Ambulatory Chair/bed transfer assist level: Touching or steadying assistance (Pt > 75%) Chair/bed transfer assistive device: Armrests, Medical sales representative Ambulation activity did not occur: Refused   Max distance: 100' Assist level: Supervision or Psychologist, clinical activity did not occur: Refused        Cognition Comprehension Comprehension assist level: Understands complex 90% of the time/cues 10% of the time  Expression Expression assist level: Expresses complex 90% of the time/cues < 10% of the time  Social Interaction Social Interaction assist level: Interacts appropriately with others - No medications needed.  Problem Solving Problem solving assist level: Solves complex 90% of the time/cues < 10% of the time  Memory Memory assist level: Recognizes or recalls 90% of the time/requires cueing < 10% of the time   Medical Problem List and Plan:  1. Functional and mobility deficits secondary to debility after congestive heart failure/LVAD/VDRF and multiple other medical issues   Continue CIR -decreased therapy to 2 hrs per day over 7 days intensity   2. DVT Prophylaxis/Anticoagulation: Pharmaceutical: Coumadin   INR therapeutic on 7/23 3. Pain Management: tylenol prn.   -T#3 for hydrotherapy 4. Mood: LCSW to follow for evaluation and support.  5. Neuropsych: This patient is ?fully capable of making decisions on his own behalf.  6. Deep tissue injury bilateral buttocks/Skin/Wound Care: Air mattress overlay.     -Hydrotherapy to buttocks M->Sa. Area improving  -T#3 as above 7. Fluids/Electrolytes/Nutrition: Strict I/Os. Continue nutritional supplement to promote wound healing  8. T2DM:  Monitor BS ac/hs. Continue Lantus   Labile and 7/23 9. A fib: Off amiodarone.   10. 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 Started on [facility] 7/12. Continue Spironolactone and digoxin daily. Monitor renal status serially.  Filed Weights   09/19/17 0442 09/20/17 0552 09/21/17 0553  Weight: 95 kg (209 lb 7 oz) 87 kg (191 lb 12.8 oz) 96 kg (211 lb 10.3 oz)   ? Reliability on 7/23  Appreciate heart failure recs 11. [name]/VF: S/p emergent cardioversion 6/24 and 6/26-->ICD turned on 7/12. To keep K> 4.0 and Mg> 2.0   Potassium supplement increased on 7/22 12. ABLA: Improved with transfusion.    Hemoglobin 9.8 on 7/22 13. Dysphagia: Continue dysphagia 2, honey liquids with chin tuck.   -continue to push po liquids  Advance diet as tolerated 14. LVAD implant 6/17: On coumadin and low dose ASA.   -Education ongoing with patient and wife. feeling more comfortable with process  -Dressing changes per VAD coordinator or trained caregiver.  15. Hyponatremia  Sodium 134 and 7/22  Continue to monitor 16. Sleep disturbance  Trazodone 50 started on 7/23    LOS (Days) 11 A FACE TO FACE EVALUATION WAS PERFORMED  Ankit Lorie Phenix, MD 09/21/2017 8:55 AM

## 2017-09-21 NOTE — Progress Notes (Signed)
Speech Language Pathology Daily Session Note  Patient Details  Name: [name] [name] MRN: [id number] Date of Birth: [birth year]  Today's Date: 09/21/2017 SLP Individual Time: 1410-1450 SLP Individual Time Calculation (min): 40 min  Short Term Goals: Week 2: SLP Short Term Goal 1 (Week 2): Pt will consume dys 2 textures and honey thick liquids with Mod I use of swallowing precautions and minimal overt s/s of aspiration.  SLP Short Term Goal 2 (Week 2): Pt will consume therapeutic trials of ice chips with supervision cues for use of swallowing precautions and minimal overt s/s of aspiration.  SLP Short Term Goal 3 (Week 2): Pt will recall new information with supervision cues for use of external aids.   SLP Short Term Goal 4 (Week 2): Pt will complete semi-complex tasks with min cues for functional problem solving.   SLP Short Term Goal 5 (Week 2): Pt will return demonstration of EMST trainer with mod I and for 75 repetitions a self reported effort level of <7/10.  SLP Short Term Goal 6 (Week 2): Pt will increase vocal intensity to achieve intelligibility at the sentence level with min assist.   Skilled Therapeutic Interventions:  Pt was seen for skilled ST targeting dysphagia goals.  Pt was seated at edge of bed with family at bedside.  Pt's voice remains aphonic but he can achieve brief intermittent voicing with increased effort.  Pt could return demonstration of EMST for 25 repetitions with mod I and a self reported effort level of 6/10.  SLP facilitated the session with trials of nectar thick liquids, ice chips, and thin liquids via teaspoon and small cup sips to continue working towards diet progression.  Pt reports no significant difference in sensation when consuming thin versus nectar thick liquids but does report feeling that nectar thick liquids "go down smoother" than honey thick liquids.  Pt also had x3 instances of delayed coughing following trials of thin liquids which could indicate  improved pharyngeal sensation given that aspiration was silent on previous instrumental swallow assessments.  Pt was stimulable for use of supraglottic swallow in isolation to improve glottic closure for cough strength and voicing with min assist demonstration cues.  Pt had increased difficulty when attempting to use supraglottic swallow as a compensatory strategy with POs.  Discussed plan for FEES tomorrow with pt who was in agreement with plan of care.  All questions related to procedure were answered to his satisfaction at this time.  Continue per current plan of care.    Function:  Eating Eating   Modified Consistency Diet: Yes Eating Assist Level: Supervision or verbal cues           Cognition Comprehension Comprehension assist level: Follows basic conversation/direction with no assist  Expression   Expression assist level: Expresses basic needs/ideas: With extra time/assistive device  Social Interaction Social Interaction assist level: Interacts appropriately with others with medication or extra time (anti-anxiety, antidepressant).  Problem Solving Problem solving assist level: Solves basic 90% of the time/requires cueing < 10% of the time  Memory Memory assist level: Recognizes or recalls 75 - 89% of the time/requires cueing 10 - 24% of the time    Pain Pain Assessment Pain Scale: 0-10 Pain Score: 0-No pain  Therapy/Group: Individual Therapy  Chantele Corado, Selinda Orion 09/21/2017, 3:55 PM

## 2017-09-21 NOTE — Progress Notes (Signed)
Physical Therapy Session Note  Patient Details  Name: [name] [name] MRN: [id number] Date of Birth: Jan 04, 1951  Today's Date: 09/21/2017 PT Individual Time: 1000-1011 PT Individual Time Calculation (min): 11 min   Short Term Goals: Week 2:  PT Short Term Goal 1 (Week 2): = LTG  Skilled Therapeutic Interventions/Progress Updates:    Pt refused out of bed activity due to pain in buttocks/wound despite max encouragement. Pt provided with HEP handout of standing therex he performed yesterday.  PT encouraged pt to perform these exercises in next treatment session. Pt verbalized understanding of HEP.  Therapy Documentation Precautions:  Precautions Precautions: Sternal, Fall Precaution Comments: LVAD, sacral wound Restrictions Weight Bearing Restrictions: No Pain:  7/10 in buttocks, repositioned, RN aware See Function Navigator for Current Functional Status.   Therapy/Group: Individual Therapy  DONAWERTH,KAREN 09/21/2017, 10:15 AM

## 2017-09-21 NOTE — Progress Notes (Addendum)
Patient ID: [name] [name], male   DOB: 1951-01-03, 67 y.o.   MRN: [id number] P    Advanced Heart Failure Rounding Note  PCP-Cardiologist: No primary care provider on file.   Subjective:    Events 6/17 Underwent HM-3 implant -> Unable to close chest with RV failure and high intrathoracic pressure 6/18 Taken back to OR for evacuation of hematoma. NO RVAD needed. Left sternum open  08/19/17 Taken back to OR for sternal closure 6/22 VT => amiodarone started.  6/24 VT/VF=>urgent cardioversion. 6/25 VT  =>urgent cardioversion  6/27 Extubated 7/12 to CIR   INR 2.08. MAPs stable.   Feeling Ok today. No SOB, orthopnea or PND. Only complaint is buttock wound. Denies SOB, dizziness, or lightheadedness. No bleeding.   LVAD Interrogation HM 3: Speed: 5300 Flow: 4.1 PI: 4.7 Power: 4.0. VAD interrogated personally. Parameters stable.    Objective:   Weight Range: 211 lb 10.3 oz (96 kg) Body mass index is 29.52 kg/m.   Vital Signs:   Temp:  [98.4 F (36.9 C)] 98.4 F (36.9 C) (07/23 0553) Pulse Rate:  [66-85] 71 (07/23 0553) Resp:  [16-18] 18 (07/23 0553) BP: (73-111)/(55-87) 91/80 (07/23 0553) SpO2:  [91 %-98 %] 91 % (07/23 0553) Weight:  [211 lb 10.3 oz (96 kg)] 211 lb 10.3 oz (96 kg) (07/23 0553) Last BM Date: 09/18/17   MAP 80s  Weight change: Filed Weights   09/19/17 0442 09/20/17 0552 09/21/17 0553  Weight: 209 lb 7 oz (95 kg) 191 lb 12.8 oz (87 kg) 211 lb 10.3 oz (96 kg)   Intake/Output:   Intake/Output Summary (Last 24 hours) at 09/21/2017 0813 Last data filed at 09/21/2017 5277 Gross per 24 hour  Intake 1560 ml  Output 1700 ml  Net -140 ml   Physical Exam   General: Well appearing this am. NAD. Hoarse. HEENT: Normal. Anicteric  Neck: Supple, JVP 7-8 cm. Carotids OK.  Cardiac:  Mechanical heart sounds with LVAD hum present.  Lungs:  CTAB, normal effort. No wheeze Abdomen:  NT, ND, no HSM. No bruits or masses. +BS  LVAD exit site:  Dressing dry and intact. No  erythema or drainage. Stabilization device present and accurately applied. Driveline dressing changed daily per sterile technique. Extremities:  Warm and dry. No cyanosis, clubbing, rash, or edema.  Dressing on buttocks  Neuro:  Alert & oriented x 3. Cranial nerves grossly intact. Moves all 4 extremities w/o difficulty. Affect pleasant     Telemetry   Not connected.   Labs    CBC Recent Labs    09/20/17 0349  WBC 4.5  HGB 9.8*  HCT 32.3*  MCV 81.8  PLT 824   Basic Metabolic Panel Recent Labs    09/20/17 0349  NA 134*  K 3.7  CL 98  CO2 26  GLUCOSE 84  BUN 18  CREATININE 1.05  CALCIUM 8.9  MG 2.1   Liver Function Tests Recent Labs    09/20/17 0349  AST 26  ALT 23  ALKPHOS 87  BILITOT 0.9  PROT 7.9  ALBUMIN 2.5*   No results for input(s): LIPASE, AMYLASE in the last 72 hours. Cardiac Enzymes No results for input(s): CKTOTAL, CKMB, CKMBINDEX, TROPONINI in the last 72 hours.  BNP: BNP (last 3 results) Recent Labs    09/05/17 2327 09/13/17 0331 09/20/17 0349  BNP 86.1 53.3 56.0    ProBNP (last 3 results) Recent Labs    11/12/16 1559  PROBNP 388.0*     D-Dimer No results  for input(s): DDIMER in the last 72 hours. Hemoglobin A1C No results for input(s): HGBA1C in the last 72 hours. Fasting Lipid Panel No results for input(s): CHOL, HDL, LDLCALC, TRIG, CHOLHDL, LDLDIRECT in the last 72 hours. Thyroid Function Tests No results for input(s): TSH, T4TOTAL, T3FREE, THYROIDAB in the last 72 hours.  Invalid input(s): FREET3  Other results:   Imaging    No results found.   Medications:     Scheduled Medications: . aspirin  81 mg Oral Daily  . bisacodyl  10 mg Oral Daily   Or  . bisacodyl  10 mg Rectal Daily  . Chlorhexidine Gluconate Cloth  6 each Topical QHS  . collagenase   Topical Daily  . famotidine  20 mg Oral BID  . insulin aspart  0-15 Units Subcutaneous TID WC  . insulin aspart  0-5 Units Subcutaneous QHS  . insulin detemir   25 Units Subcutaneous QHS  . magnesium oxide  400 mg Oral BID  . mouth rinse  15 mL Mouth Rinse BID  . multivitamin with minerals  1 tablet Oral Daily  . nystatin  5 mL Oral QID  . potassium chloride  30 mEq Oral BID  . protein supplement shake  11 oz Oral BID BM  . sodium chloride flush  10-40 mL Intracatheter Q12H  . spironolactone  25 mg Oral Daily  . torsemide  60 mg Oral Daily  . vitamin C  500 mg Oral BID  . Warfarin - Pharmacist Dosing Inpatient   Does not apply q1800    Infusions: . lactated ringers      PRN Medications: acetaminophen, acetaminophen-codeine, alum & mag hydroxide-simeth, bisacodyl, clonazepam, diphenhydrAMINE, guaiFENesin-dextromethorphan, lactated ringers, levalbuterol, polyethylene glycol, prochlorperazine **OR** prochlorperazine **OR** prochlorperazine, RESOURCE THICKENUP CLEAR, sodium chloride flush, sodium phosphate, traZODone    Patient Profile   [name] [name] is a 67 y.o. 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   Directly admitted with persistent low cardiac output for milrinone initiation for home.  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 Acute/Chronic systolic CHF with biventricular failure-> cardiogenic shock: Echo  08/13/2017 EF 20-25%. s/p  Medtronic BiV ICD in place. Cath 12/18 with stable 1v CAD.  s/p HM-3 implant 6/17 under destination therapy. Unable to close chest due to high-intrathoracic pressures and RV failure. Taken back to OR 08/17/17 with evacuation of hematoma with improvement. Extubated 6/28.  Ramp ECHO 7/11 with speed increased to 5300.  - Volume status stable on exam.  - Continue torsemide 60 mg daily  - Continue spiro 25 mg daily.  2. 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 Ramp ECHO speed increased to 5300.   - VAD interrogated personally. Parameters stable.   - Continue warfarin + ASA 81, INR 2.08  today.  - LDH 193 09/20/17 3. Acute on CKD II-III:  Resolved.  4. [name]/VF: Recurrent VT on 6/22.  S/P VF 6/24 emergent bedside cardioversion. S/P VT 6/26 emergent bedside cardioversion. ICD turned back on.   - No change to current plan.   5. AFL/atrial fibrillation: S/p previous ablation. He is currently in atrial fibrillation.  - Rate controlled. Amio stopped.  - On coumadin. No bleeding.   6. [name]: Recent cath with stable CAD as above:  No s/s of ischemia.    7. [name]:  Recent A1c 8.3 on 6/3.  - Continue current regimen. No change.  8. Anemia: Post-op.  Got 1 unit PRBCs 6/22 and 6/25. - Stable. Hgb 9.8 09/20/17 9. Deconditioning: Severe, working with PT aggressively.   - No change to current plan.   10. Unstageable Pressure Ulcer: R and L buttock.  R buttock with necrosis noted. Continue santyl for selective debridement. Change daily. Continue air overlay. Needs to reposition every hour when in the chair. Keep R /L in bed. . May need a different chair cushion. Added hydrotherapy.   - WOC following. No change.  11. 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 Consider ENT f/u as outpatient.   Tentatively home 09/25/17 (Saturday)  I reviewed the LVAD parameters from today, and compared the results to the patient's prior recorded data.  No programming changes were made.  The LVAD is functioning within specified parameters.  The patient performs LVAD self-test daily.  LVAD interrogation was negative for any significant power changes, alarms or PI events/speed drops.  LVAD equipment check completed and is in good working order.  Back-up equipment present.   LVAD education done on emergency procedures and precautions and reviewed exit site care.   Length of Stay: [place], [place]  09/21/2017, 8:13 AM  Advanced Heart Failure Team Pager [phone] (M-F; 7a - 4p)  Please contact [facility] for night-coverage after hours  (4p -7a ) and weekends on [email or web address]  Patient seen and examined with the above-signed Advanced Practice Provider and/or Housestaff. I personally reviewed laboratory data, imaging studies and relevant notes. I independently examined the patient and formulated the important aspects of the plan. I have edited the note to reflect any of my changes or salient points. I have personally discussed the plan with the patient and/or family.  Doing well. MAPs, VAD parameters and INR (2.0) all stable. Progressing well with PT. Undergoing hydrotherapy for buttock wound. Remains hoarse and may have damage to recurrent laryngeal nerve, if persists will need ENT eval as outpatient.  Following. VAD interrogated personally. Parameters stable. Volume status ok. Continue Rehab.   Glori Bickers, MD  1:14 PM

## 2017-09-21 NOTE — Progress Notes (Signed)
Nutrition Follow-up  DOCUMENTATION CODES:   Obesity unspecified  INTERVENTION:  Continue Premier Protein po BID, each supplement provides 160 kcal and 30 grams of protein.   Encourage adequate PO intake.   NUTRITION DIAGNOSIS:   Increased nutrient needs related to wound healing as evidenced by estimated needs; ongoing  GOAL:   Patient will meet greater than or equal to 90% of their needs; met  MONITOR:   PO intake, Supplement acceptance, Weight trends, Labs  REASON FOR ASSESSMENT:   Malnutrition Screening Tool    ASSESSMENT:   Patient with PMH ischemic cardiomyopathy s/p ICD, HTN, T2DM with retinopathy, CKD, CHF. He was recently admitted on 6/12 due to CHF, eventually had an LVAD placed with hematoma evacuation. Suffered cardiogenic shock requiring intubation. He is now admitted to rehab.  Meal completion has been 100%. Pt currently has Premier protein ordered and has been consuming them. Intake has been good. RD to continue with current orders to aid in caloric and protein needs as well as in wound healing. Pt encouraged to eat his food at meals and to drink his supplements.    Labs and medications reviewed.   Diet Order:   Diet Order           DIET DYS 2 Room service appropriate? Yes; Fluid consistency: Honey Thick  Diet effective now          EDUCATION NEEDS:   No education needs have been identified at this time  Skin:  Skin Assessment: Skin Integrity Issues: Skin Integrity Issues:: Unstageable, Incisions Unstageable: R buttocks Incisions: chest  Last BM:  7/20  Height:   Ht Readings from Last 1 Encounters:  09/10/17 '5\' 11"'  (1.803 m)    Weight:   Wt Readings from Last 1 Encounters:  09/21/17 211 lb 10.3 oz (96 kg)    Ideal Body Weight:  78.2 kg  BMI:  Body mass index is 29.52 kg/m.  Estimated Nutritional Needs:   Kcal:  2200-2350 kcal/day  Protein:  115-130 grams  Fluid:  >2L    Corrin Parker, MS, RD, LDN Pager # [phone] After  hours/ weekend pager # [phone]

## 2017-09-21 NOTE — Progress Notes (Signed)
Physical Therapy Session Note  Patient Details  Name: [name] [name] MRN: [id number] Date of Birth: [birth year]  Today's Date: 09/21/2017 PT Individual Time: 1306-1401 PT Individual Time Calculation (min): 55 min   Short Term Goals: Week 2:  PT Short Term Goal 1 (Week 2): = LTG  Skilled Therapeutic Interventions/Progress Updates:  Pt received in bed & agreeable to tx, noting only pain in buttocks 2/2 sacral wound. Pt instructed grandson in assisting him with connecting LVAD to batteries and Marine scientist. Pt transfers supine>sitting EOB and sit<>stand with supervision with cuing to not push through BUE for sit>stand and pt reporting he only uses UE for balance. Pt ambulates room<>gym with RW & supervision with decreased gait speed. Recreational therapist joined in session and addressed dynamic standing balance by having pt stand on compliant foam surface without UE support while engaging in ball toss within ranges appropriate to maintain sternal precautions; therapist provides min assist for standing balance. Recreational therapist then exists session. Pt performed LE HEP given by primary therapist with demonstration and cuing from therapist. Exercises included hip abduction, heel raises, hamstring curls, and mini squats with seated rest breaks taken PRN. Back in room pt & grandson transfer LVAD to wall power. Pt left sitting on EOB with grandson & visitor present, awaiting SLP arrival.   Therapy Documentation Precautions:  Precautions Precautions: Sternal, Fall Precaution Comments: LVAD, sacral wound Restrictions Weight Bearing Restrictions: No   See Function Navigator for Current Functional Status.   Therapy/Group: Individual Therapy  Waunita Schooner 09/21/2017, 2:04 PM

## 2017-09-22 ENCOUNTER — Inpatient Hospital Stay (HOSPITAL_COMMUNITY): Payer: PPO

## 2017-09-22 ENCOUNTER — Inpatient Hospital Stay (HOSPITAL_COMMUNITY): Payer: PPO | Admitting: Physical Therapy

## 2017-09-22 ENCOUNTER — Ambulatory Visit (HOSPITAL_COMMUNITY): Payer: PPO

## 2017-09-22 ENCOUNTER — Inpatient Hospital Stay (HOSPITAL_COMMUNITY): Payer: PPO | Admitting: Speech Pathology

## 2017-09-22 LAB — GLUCOSE, CAPILLARY
GLUCOSE-CAPILLARY: 130 mg/dL — AB (ref 70–99)
GLUCOSE-CAPILLARY: 148 mg/dL — AB (ref 70–99)
Glucose-Capillary: 72 mg/dL (ref 70–99)
Glucose-Capillary: 115 mg/dL — ABNORMAL HIGH (ref 70–99)

## 2017-09-22 LAB — PROTIME-INR
INR: 2.3
Prothrombin Time: 25.1 seconds — ABNORMAL HIGH (ref 11.4–15.2)

## 2017-09-22 MED ORDER — WARFARIN SODIUM 2.5 MG PO TABS
2.5000 mg | ORAL_TABLET | Freq: Every day | ORAL | Status: DC
  Administered 2017-09-22 – 2017-09-24 (×3): 2.5 mg via ORAL
  Filled 2017-09-22 (×3): qty 1

## 2017-09-22 NOTE — Progress Notes (Signed)
Social Work Patient ID: [name] [name], male   DOB: 04/23/1950, 67 y.o.   MRN: [id number]   CSW met with pt 09-21-17 to update him on team conference discussion and targeted d/c date remaining the same at 09-25-17.  While CSW was updating pt, his wife called and CSW was able to update her, as well.  She is prepared for pt to be at home and she will provide 24/7 supervision and one of their dtrs will help, as well.  They have already gone through family education and feel comfortable with pt's care.  CSW explained that DME will be ordered and [place] arranged for pt prior to d/c.  CSW will continue to follow and assist as needed.

## 2017-09-22 NOTE — Procedures (Signed)
Objective Swallowing Evaluation: Type of Study: FEES-Fiberoptic Endoscopic Evaluation of Swallow   Patient Details  Name: [name] [name] MRN: [id number] Date of Birth: Feb 07, 1951  Today's Date: 09/22/2017 Time: SLP Start Time (ACUTE ONLY): 1000 -SLP Stop Time (ACUTE ONLY):1050  SLP Time Calculation (min) (ACUTE ONLY): 50 min   Past Medical History:  Past Medical History:  Diagnosis Date  . AICD (automatic cardioverter/defibrillator) present 02/05/2014   Upgrade to Medtronic biventricular ICD, serial number  [id number]   . Atrial flutter ([place]) 04/2012   s/p TEE-EPS+RFCA 04/2012  . CAD (coronary artery disease) 5053,9767 X 2    RCA-T, 70% PL (off CFX), 99% Prox LAD/90% Dist LAD, S/P TAXUS stent x 2  . CHF (congestive heart failure) ([place])   . Chronic anticoagulation   . Chronic systolic heart failure ([place])   . CKD (chronic kidney disease)   . Diabetic retinopathy ([place])   . DM type 2 (diabetes mellitus, type 2) ([facility])    insulin dependent  . HTN (hypertension)   . Hypercholesteremia    ablation  . ICD (implantable cardiac defibrillator) in place   . Ischemic cardiomyopathy March 2015   20-25% 2D   . Nephrolithiasis   . Ventricular tachycardia [facility])    Past Surgical History:  Past Surgical History:  Procedure Laterality Date  . ATRIAL FLUTTER ABLATION N/A 05/19/2012   Procedure: ATRIAL FLUTTER ABLATION;  Surgeon: Thompson Grayer, MD;  Location: [facility] CATH LAB;  Service: Cardiovascular;  Laterality: N/A;  . BI-VENTRICULAR IMPLANTABLE CARDIOVERTER DEFIBRILLATOR UPGRADE N/A 02/05/2014   Procedure: BI-VENTRICULAR IMPLANTABLE CARDIOVERTER DEFIBRILLATOR UPGRADE;  Surgeon: Evans Lance, MD;  Location: [facility] CATH LAB;  Service: Cardiovascular;  Laterality: N/A;  . BIV ICD GENERTAOR CHANGE OUT  02/05/2014   Upgrade to Medtronic biventricular ICD, serial number  [id number] by Dr. [name]  . CARDIAC DEFIBRILLATOR PLACEMENT  2007    Medtronic Maximo VR, serial number [id number]  . INSERTION OF  IMPLANTABLE LEFT VENTRICULAR ASSIST DEVICE N/A 08/16/2017   Procedure: INSERTION OF IMPLANTABLE LEFT VENTRICULAR ASSIST DEVICE-HM3;  Surgeon: Ivin Poot, MD;  Location: [place];  Service: Open Heart Surgery;  Laterality: N/A;  . IR FLUORO GUIDE CV LINE RIGHT  08/12/2017  . IR US GUIDE VASC ACCESS RIGHT  08/12/2017  . MEDIASTINAL EXPLORATION  08/17/2017   Procedure: MEDIASTINAL REXPLORATION with evacuation of hematoma;  Surgeon: Prescott Gum, Collier Salina, MD;  Location: [facility] OR;  Service: Open Heart Surgery;;  . PERCUTANEOUS CORONARY STENT INTERVENTION (PCI-S)  January 2002   PTCA/Stent Distal RCA  . PERCUTANEOUS CORONARY STENT INTERVENTION (PCI-S)  June 2002   PTCA/Stent x 3 RCA, thrombolysis - failed  . PERCUTANEOUS CORONARY STENT INTERVENTION (PCI-S)  July 2006   TAXUS stents to prox and distal LAD  . RIGHT HEART CATH N/A 07/22/2017   Procedure: RIGHT HEART CATH;  Surgeon: Jolaine Artist, MD;  Location: [place] CV LAB;  Service: Cardiovascular;  Laterality: N/A;  . RIGHT HEART CATH N/A 08/13/2017   Procedure: RIGHT HEART CATH - swan;  Surgeon: Jolaine Artist, MD;  Location: [place] CV LAB;  Service: Cardiovascular;  Laterality: N/A;  . RIGHT/LEFT HEART CATH AND CORONARY ANGIOGRAPHY N/A 02/18/2017   Procedure: RIGHT/LEFT HEART CATH AND CORONARY ANGIOGRAPHY;  Surgeon: Jolaine Artist, MD;  Location: [place] CV LAB;  Service: Cardiovascular;  Laterality: N/A;  . STERNAL CLOSURE N/A 08/19/2017   Procedure: STERNAL CLOSURE;  Surgeon: Ivin Poot, MD;  Location: [place];  Service: Thoracic;  Laterality: N/A;  .  TEE WITHOUT CARDIOVERSION N/A 08/16/2017   Procedure: TRANSESOPHAGEAL ECHOCARDIOGRAM (TEE);  Surgeon: Prescott Gum, Collier Salina, MD;  Location: [place];  Service: Open Heart Surgery;  Laterality: N/A;  . TEE WITHOUT CARDIOVERSION N/A 08/19/2017   Procedure: TRANSESOPHAGEAL ECHOCARDIOGRAM (TEE);  Surgeon: Prescott Gum, Collier Salina, MD;  Location: [place];  Service: Thoracic;  Laterality: N/A;  .  TRICUSPID VALVE REPLACEMENT N/A 08/16/2017   Procedure: TRICUSPID VALVE REPAIR using Oletta Lamas MC3 Ring size 30;  Surgeon: Ivin Poot, MD;  Location: [place];  Service: Open Heart Surgery;  Laterality: N/A;   HPI: 67 y.o. 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    Subjective: Pt arrives with RN    Assessment / Plan / Recommendation  CHL IP CLINICAL IMPRESSIONS 09/22/2017  Clinical Impression Patient continues to demonstrate a moderate-severe pharyngeal dysphagia, however, swallowing function appears improved since last MBS.  There appears to be no movement of the left vocal cord leading to incomplete glottic closure and penetration over the left aryepiglottic fold of thin liquids via tsp and nectar-thick liquids via cup.  Strategies were ineffective in reducing penetration and a head turn to the left led to sensed aspiration of thin liquids, however, use of a chin tuck along with an intermittent throat clear was effective in reducing penetration of nectar-thick liquids. Patient demonstrates good clearance of solids but demonstrates mild, diffuse pharyngeal residuals with liquids that clears with multiple swallows. Therefore, question possible esophageal component.  Recommend considering upgrade to nectar-thick liquids via cup with use of a chin tuck and an intermittent throat clear, however, will defer to patient's primary SLP.   SLP Visit Diagnosis Dysphagia, pharyngeal phase (R13.13)  Attention and concentration deficit following --  Frontal lobe and executive function deficit following --  Impact on safety and function Moderate aspiration risk;Severe aspiration risk      CHL IP TREATMENT RECOMMENDATION 09/22/2017  Treatment Recommendations Therapy as outlined in treatment plan below     Prognosis 09/22/2017  Prognosis for Safe Diet Advancement Good  Barriers  to Reach Goals --  Barriers/Prognosis Comment --    CHL IP DIET RECOMMENDATION 09/22/2017  SLP Diet Recommendations Dysphagia 2 (Fine chop) solids;Honey thick liquids  Liquid Administration via --  Medication Administration Crushed with puree  Compensations Clear throat intermittently;Multiple dry swallows after each bite/sip;Minimize environmental distractions;Slow rate;Small sips/bites;Chin tuck  Postural Changes --      CHL IP OTHER RECOMMENDATIONS 09/22/2017  Recommended Consults --  Oral Care Recommendations Oral care BID  Other Recommendations Remove water pitcher;Have oral suction available;Prohibited food (jello, ice cream, thin soups);Order thickener from pharmacy      CHL IP FOLLOW UP RECOMMENDATIONS 09/22/2017  Follow up Recommendations Home health SLP;Outpatient SLP      CHL IP FREQUENCY AND DURATION 09/22/2017  Speech Therapy Frequency (ACUTE ONLY) min 5x/week  Treatment Duration 2 weeks           CHL IP ORAL PHASE 09/22/2017  Oral Phase WFL  Oral - Pudding Teaspoon --  Oral - Pudding Cup --  Oral - Honey Teaspoon --  Oral - Honey Cup --  Oral - Nectar Teaspoon --  Oral - Nectar Cup --  Oral - Nectar Straw --  Oral - Thin Teaspoon --  Oral - Thin Cup --  Oral - Thin Straw --  Oral - Puree --  Oral -  Mech Soft --  Oral - Regular --  Oral - Multi-Consistency --  Oral - Pill --  Oral Phase - Comment --    CHL IP PHARYNGEAL PHASE 09/22/2017  Pharyngeal Phase Impaired  Pharyngeal- Pudding Teaspoon --  Pharyngeal --  Pharyngeal- Pudding Cup --  Pharyngeal --  Pharyngeal- Honey Teaspoon NT  Pharyngeal --  Pharyngeal- Honey Cup NT  Pharyngeal --  Pharyngeal- Nectar Teaspoon Penetration/Aspiration during swallow;Reduced airway/laryngeal closure;Pharyngeal residue - valleculae  Pharyngeal Material enters airway, remains ABOVE vocal cords and not ejected out  Pharyngeal- Nectar Cup Penetration/Aspiration during swallow;Reduced airway/laryngeal  closure;Pharyngeal residue - valleculae  Pharyngeal Material enters airway, CONTACTS cords and then ejected out  Pharyngeal- Nectar Straw Penetration/Aspiration during swallow;Reduced airway/laryngeal closure;Pharyngeal residue - valleculae  Pharyngeal Material enters airway, CONTACTS cords and not ejected out  Pharyngeal- Thin Teaspoon Penetration/Aspiration during swallow;Reduced airway/laryngeal closure;Pharyngeal residue - valleculae  Pharyngeal Material enters airway, passes BELOW cords and not ejected out despite cough attempt by patient;Material enters airway, remains ABOVE vocal cords and not ejected out  Pharyngeal- Thin Cup NT  Pharyngeal --  Pharyngeal- Thin Straw --  Pharyngeal --  Pharyngeal- Puree WFL  Pharyngeal --  Pharyngeal- Mechanical Soft WFL  Pharyngeal --  Pharyngeal- Regular NT  Pharyngeal --  Pharyngeal- Multi-consistency --  Pharyngeal --  Pharyngeal- Pill --  Pharyngeal --  Pharyngeal Comment --     CHL IP CERVICAL ESOPHAGEAL PHASE 09/03/2017  Cervical Esophageal Phase WFL  Pudding Teaspoon --  Pudding Cup --  Honey Teaspoon --  Honey Cup --  Nectar Teaspoon --  Nectar Cup --  Nectar Straw --  Thin Teaspoon --  Thin Cup --  Thin Straw --  Puree --  Mechanical Soft --  Regular --  Multi-consistency --  Pill --  Cervical Esophageal Comment --    CHL IP GO 09/06/2017  Functional Assessment Tool Used (None)  Functional Limitations (None)  Swallow Current Status (J1791) CH  Swallow Goal Status (T0569) (None)  Swallow Discharge Status (V9480) (None)  Motor Speech Current Status (X6553) (None)  Motor Speech Goal Status (Z4827) (None)  Motor Speech Goal Status (M7867) (None)  Spoken Language Comprehension Current Status (J4492) (None)  Spoken Language Comprehension Goal Status (E1007) (None)  Spoken Language Comprehension Discharge Status (H2197) (None)  Spoken Language Expression Current Status (J8832) (None)  Spoken Language Expression Goal  Status (P4982) (None)  Spoken Language Expression Discharge Status [phone]) (None)  Attention Current Status (X0940) (None)  Attention Goal Status (H6808) (None)  Attention Discharge Status (U1103) (None)  Memory Current Status (P5945) (None)  Memory Goal Status (O5929) (None)  Memory Discharge Status (W4462) (None)  Voice Current Status (M6381) (None)  Voice Goal Status (R7116) (None)  Voice Discharge Status (F7903) (None)  Other Speech-Language Pathology Functional Limitation Current Status (Y3338) (None)  Other Speech-Language Pathology Functional Limitation Goal Status (V2919) (None)  Other Speech-Language Pathology Functional Limitation Discharge Status [phone]) (None)    Kamarri Fischetti 09/22/2017, 3:23 PM

## 2017-09-22 NOTE — Progress Notes (Signed)
Physical Therapy Session Note  Patient Details  Name: [name] [name] MRN: [id number] Date of Birth: 09/05/1950  Today's Date: 09/22/2017 PT Individual Time: 0900-0942 PT Individual Time Calculation (min): 42 min   Short Term Goals: Week 2:  PT Short Term Goal 1 (Week 2): = LTG  Skilled Therapeutic Interventions/Progress Updates:    pt able to don shorts with supervision, increased time. pt performs gait 150' x 3 with supervision with RW.  Standing therex for activity tolerance and strengthening 2 x 10 hip abd, HS curls, heel raises, mini squats.  Gait without AD for strengthening x 35' with min guard, pt states legs feel "burning" and fatigued after gait without AD.  Bed mobility in ADL apartment with supervision, pt states no increased pain in wound with ADL bed vs air mattress.  Continued education on importance of pressure relief to wound healing, pt verbalizes understanding.  Therapy Documentation Precautions:  Precautions Precautions: Sternal, Fall Precaution Comments: LVAD, sacral wound Restrictions Weight Bearing Restrictions: No Pain:  no c/o pain   Therapy/Group: Individual Therapy  Keary Hanak 09/22/2017, 9:42 AM

## 2017-09-22 NOTE — Progress Notes (Signed)
Physical Therapy Session Note  Patient Details  Name: JOEVANNI RODDEY MRN: [id number] Date of Birth: Dec 28, 1950  Today's Date: 09/22/2017 PT Individual Time: 1455-1525 PT Individual Time Calculation (min): 30 min   Short Term Goals:  Week 2:  PT Short Term Goal 1 (Week 2): = LTG  Skilled Therapeutic Interventions/Progress Updates:   Pt sitting up in bed, stating buttocks hurt due to recent hydrotherapy for wound healing.  Pain rated 7/10 R buttock.   In L/R side lying: 12 x 1 each: R/L clam shells for hip abduction, bil hip flexion.  Sitting EOB, pt donned non slip socks after set-up, and in standing, donned boxer shorts with close supervision for balance..  Pt managed LVAD wall power>< battery power with extra time, cues for correct connections.  With black and white wires, pt attempted to connect male end of cord to another male connection.  Pt left resting in L sidelying in bed with needs at hand, bed alarm set. LVAD connected to wall power.     Therapy Documentation Precautions:  Precautions Precautions: Sternal, Fall Precaution Comments: LVAD, sacral wound Restrictions Weight Bearing Restrictions: No   See Function Navigator for Current Functional Status.   Therapy/Group: Individual Therapy  Miryam Mcelhinney 09/22/2017, 3:12 PM

## 2017-09-22 NOTE — Progress Notes (Signed)
[place] PHYSICAL MEDICINE & REHABILITATION     PROGRESS NOTE    Subjective/Complaints: Patient seen sitting up at the edge of his bed this morning eating breakfast. He states he did not sleep well overnight, no difference than previous nights. He has questions about his wound care post-discharge.  ROS: Denies CP, SOB, nausea, vomiting, diarrhea.  Objective:  No results found. Recent Labs    09/20/17 0349  WBC 4.5  HGB 9.8*  HCT 32.3*  PLT 209   Recent Labs    09/20/17 0349  NA 134*  K 3.7  CL 98  GLUCOSE 84  BUN 18  CREATININE 1.05  CALCIUM 8.9   CBG (last 3)  Recent Labs    09/21/17 1704 09/21/17 2123 09/22/17 0636  GLUCAP 111* 164* 72    Wt Readings from Last 3 Encounters:  09/22/17 96 kg (211 lb 10.3 oz)  09/10/17 97.8 kg (215 lb 8 oz)  07/22/17 103.9 kg (229 lb)     Intake/Output Summary (Last 24 hours) at 09/22/2017 1108 Last data filed at 09/22/2017 0830 Gross per 24 hour  Intake 840 ml  Output 1250 ml  Net -410 ml    Vital Signs: Blood pressure 100/80, pulse 72, temperature (!) 96.7 F (35.9 C), resp. rate 18, height 5\' 11"  (1.803 m), weight 96 kg (211 lb 10.3 oz), SpO2 98 %. Physical Exam:  Constitutional: No distress . Vital signs reviewed. HENT: Normocephalic.  Atraumatic. Eyes: EOMI. No discharge. Cardiovascular: RRR. No JVD. Respiratory: CTA bilaterally. Normal effort. GI: BS +. Non-distended. Musc: No edema or tenderness in extremities. Neurological: He is alert and oriented  dysphonia Motor: B/l UE 4/5 prox to distal. (unchanged) B/l LE: 4-/5 HF, 4/5 KE and 4+/5 ADF/PF. (unchanged) Skin: Skin is warm. Intact. Psychiatric: pleasant and cooperative   Assessment/Plan: 1. Functional deficits secondary to debility after CHF/LVAD/VDRF which require 2hr per day over 7 days of interdisciplinary therapy in a comprehensive inpatient rehab setting. Physiatrist is providing close team supervision and 24 hour management of active medical  problems listed below. Physiatrist and rehab team continue to assess barriers to discharge/monitor patient progress toward functional and medical goals.  Function:  Bathing Bathing position   Position: Sitting EOB  Bathing parts Body parts bathed by patient: Right arm, Left arm, Chest, Abdomen, Front perineal area, Right upper leg, Left upper leg, Right lower leg, Left lower leg Body parts bathed by helper: Buttocks  Bathing assist Assist Level: Touching or steadying assistance(Pt > 75%)      Upper Body Dressing/Undressing Upper body dressing   What is the patient wearing?: Pull over shirt/dress     Pull over shirt/dress - Perfomed by patient: Thread/unthread right sleeve, Thread/unthread left sleeve, Put head through opening, Pull shirt over trunk          Upper body assist Assist Level: Set up      Lower Body Dressing/Undressing Lower body dressing   What is the patient wearing?: Underwear, Pants, Socks, Shoes Underwear - Performed by patient: Thread/unthread right underwear leg, Thread/unthread left underwear leg, Pull underwear up/down   Pants- Performed by patient: Thread/unthread right pants leg, Pull pants up/down, Thread/unthread left pants leg Pants- Performed by helper: Thread/unthread left pants leg Non-skid slipper socks- Performed by patient: Don/doff right sock, Don/doff left sock Non-skid slipper socks- Performed by helper: Don/doff right sock, Don/doff left sock Socks - Performed by patient: Don/doff right sock, Don/doff left sock   Shoes - Performed by patient: Don/doff right shoe, Don/doff left shoe,  Fasten right, Fasten left            Lower body assist Assist for lower body dressing: Touching or steadying assistance (Pt > 75%)      Toileting Toileting Toileting activity did not occur: No continent bowel/bladder event Toileting steps completed by patient: Adjust clothing prior to toileting, Performs perineal hygiene, Adjust clothing after  toileting Toileting steps completed by helper: Performs perineal hygiene, Adjust clothing after toileting Toileting Assistive Devices: Grab bar or rail  Toileting assist Assist level: Touching or steadying assistance (Pt.75%)   Transfers Chair/bed transfer Chair/bed transfer activity did not occur: Refused Chair/bed transfer method: Ambulatory Chair/bed transfer assist level: Touching or steadying assistance (Pt > 75%) Chair/bed transfer assistive device: Armrests, Medical sales representative Ambulation activity did not occur: Refused   Max distance: 150 ft Assist level: Supervision or verbal cues   Wheelchair Wheelchair activity did not occur: Refused        Cognition Comprehension Comprehension assist level: Understands complex 90% of the time/cues 10% of the time  Expression Expression assist level: Expresses complex 90% of the time/cues < 10% of the time  Social Interaction Social Interaction assist level: Interacts appropriately with others - No medications needed.  Problem Solving Problem solving assist level: Solves complex 90% of the time/cues < 10% of the time  Memory Memory assist level: Recognizes or recalls 90% of the time/requires cueing < 10% of the time   Medical Problem List and Plan:  1. Functional and mobility deficits secondary to debility after congestive heart failure/LVAD/VDRF and multiple other medical issues   Continue CIR -decreased therapy to 2 hrs per day over 7 days intensity   2. DVT Prophylaxis/Anticoagulation: Pharmaceutical: Coumadin   INR therapeutic on 7/24 3. Pain Management: tylenol prn.   -T#3 for hydrotherapy 4. Mood: LCSW to follow for evaluation and support.  5. Neuropsych: This patient is ?fully capable of making decisions on his own behalf.  6. Deep tissue injury bilateral buttocks/Skin/Wound Care: Air mattress overlay.     -Hydrotherapy to buttocks M->Sa. Area improving  -T#3 as above 7. Fluids/Electrolytes/Nutrition: Strict  I/Os. Continue nutritional supplement to promote wound healing  8. T2DM: Monitor BS ac/hs. Continue Lantus   Labile and 7/24 9. A fib: Off amiodarone.   10. 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 Started on [facility] 7/12. Continue Spironolactone and digoxin daily. Monitor renal status serially.  Filed Weights   09/20/17 0552 09/21/17 0553 09/22/17 0500  Weight: 87 kg (191 lb 12.8 oz) 96 kg (211 lb 10.3 oz) 96 kg (211 lb 10.3 oz)   ? Reliability on 7/24  Appreciate heart failure recs 11. [name]/VF: S/p emergent cardioversion 6/24 and 6/26-->ICD turned on 7/12. To keep K> 4.0 and Mg> 2.0   Potassium supplement increased on 7/22  Labs ordered for Friday 12. ABLA: Improved with transfusion.    Hemoglobin 9.8 on 7/22 13. Dysphagia: Continue dysphagia 2, honey liquids with chin tuck.   -continue to push po liquids  MBS today, Advance diet as tolerated 14. LVAD implant 6/17: On coumadin and low dose ASA.   -Education ongoing with patient and wife. feeling more comfortable with process  -Dressing changes per VAD coordinator or trained caregiver.  15. Hyponatremia  Sodium 134 and 7/22  Labs ordered for Friday  Continue to monitor 16. Sleep disturbance  Trazodone 50 started on 7/23, increased to 100 on 7/24   LOS (Days) 12 A FACE TO FACE EVALUATION WAS PERFORMED  Granvil Djordjevic Maurene Capes  Posey Pronto, MD 09/22/2017 11:08 AM

## 2017-09-22 NOTE — Consult Note (Signed)
[facility] Nurse wound follow up Wound type:unstageable pressure wound  Measurement:(see Hydrotherapy note for today's measurement) Wound LNZ:VJKQA buttock 40% pink, 60% white Left buttock has two smaller full thicknes wounds 100% pink, now with foam dressings. Drainage (amount, consistency, odor) yellow tint to drainage, possibly from [facility] Periwound:intact Dressing procedure/placement/frequency: Hydrotherapy PT states that patient will be going home maybe as soon as tomorrow. While in house, continue with Santyl and Hydrotherapy as ordered. [place] team will continue a weekly assessment. If need assistance in between visits please re-consult.  Fara Olden, RN-C, WTA-C, [place] Wound Treatment Associate Ostomy Care Associate

## 2017-09-22 NOTE — Progress Notes (Addendum)
Patient ID: [name] [name], male   DOB: January 28, 1951, 67 y.o.   MRN: [id number] P    Advanced Heart Failure Rounding Note  PCP-Cardiologist: No primary care provider on file.   Subjective:    Events 6/17 Underwent HM-3 implant -> Unable to close chest with RV failure and high intrathoracic pressure 6/18 Taken back to OR for evacuation of hematoma. NO RVAD needed. Left sternum open  08/19/17 Taken back to OR for sternal closure 6/22 VT => amiodarone started.  6/24 VT/VF=>urgent cardioversion. 6/25 VT  =>urgent cardioversion  6/27 Extubated 7/12 to CIR   INR 2.30. MAPs stable.   Feeling good today. About to get swallow study and hopefully advance diet from Dys 2/Honey. Denies SOB, dizziness, or lightheadedness. No bleeding. Only real complaint is buttock pain from wound. Getting hydrotherapy. Wound redressed today. Progressing with rehab.   LVAD Interrogation HM 3: Speed: 5300 Flow: 3.7 PI: 4.5 Power: 4.0  VAD interrogated personally. Parameters stable.    Objective:   Weight Range: 211 lb 10.3 oz (96 kg) Body mass index is 29.52 kg/m.   Vital Signs:   Temp:  [96.7 F (35.9 C)] 96.7 F (35.9 C) (07/23 1450) Pulse Rate:  [72] 72 (07/23 1450) BP: (100-110)/(80) 100/80 (07/23 1600) SpO2:  [98 %] 98 % (07/23 1450) Weight:  [211 lb 10.3 oz (96 kg)] 211 lb 10.3 oz (96 kg) (07/24 0500) Last BM Date: 09/18/17   MAP 70-80s  Weight change: Filed Weights   09/20/17 0552 09/21/17 0553 09/22/17 0500  Weight: 191 lb 12.8 oz (87 kg) 211 lb 10.3 oz (96 kg) 211 lb 10.3 oz (96 kg)   Intake/Output:   Intake/Output Summary (Last 24 hours) at 09/22/2017 1011 Last data filed at 09/22/2017 0547 Gross per 24 hour  Intake 480 ml  Output 1250 ml  Net -770 ml   Physical Exam   General: NAD. Hoarse.  HEENT: Normal. anicteric Neck: Supple, JVP 7-8 cm. Carotids OK.  Cardiac:  Mechanical heart sounds with LVAD hum present.  Lungs:  CTAB, normal effort. No wheeze Abdomen:  NT, ND, no HSM.  No bruits or masses. +BS  LVAD exit site: Dressing dry and intact. No erythema or drainage. Stabilization device present and accurately applied. Driveline dressing changed daily per sterile technique. Extremities:  Warm and dry. No cyanosis, clubbing, rash, or edema. Wound dressing on buttocks.  Neuro: alert & oriented x 3, cranial nerves grossly intact. moves all 4 extremities w/o difficulty. Affect pleasant  Telemetry   Not connected.   Labs    CBC Recent Labs    09/20/17 0349  WBC 4.5  HGB 9.8*  HCT 32.3*  MCV 81.8  PLT 202   Basic Metabolic Panel Recent Labs    09/20/17 0349  NA 134*  K 3.7  CL 98  CO2 26  GLUCOSE 84  BUN 18  CREATININE 1.05  CALCIUM 8.9  MG 2.1   Liver Function Tests Recent Labs    09/20/17 0349  AST 26  ALT 23  ALKPHOS 87  BILITOT 0.9  PROT 7.9  ALBUMIN 2.5*   No results for input(s): LIPASE, AMYLASE in the last 72 hours. Cardiac Enzymes No results for input(s): CKTOTAL, CKMB, CKMBINDEX, TROPONINI in the last 72 hours.  BNP: BNP (last 3 results) Recent Labs    09/05/17 2327 09/13/17 0331 09/20/17 0349  BNP 86.1 53.3 56.0    ProBNP (last 3 results) Recent Labs    11/12/16 1559  PROBNP 388.0*  D-Dimer No results for input(s): DDIMER in the last 72 hours. Hemoglobin A1C No results for input(s): HGBA1C in the last 72 hours. Fasting Lipid Panel No results for input(s): CHOL, HDL, LDLCALC, TRIG, CHOLHDL, LDLDIRECT in the last 72 hours. Thyroid Function Tests No results for input(s): TSH, T4TOTAL, T3FREE, THYROIDAB in the last 72 hours.  Invalid input(s): FREET3  Other results:   Imaging    No results found.   Medications:     Scheduled Medications: . aspirin  81 mg Oral Daily  . bisacodyl  10 mg Oral Daily   Or  . bisacodyl  10 mg Rectal Daily  . Chlorhexidine Gluconate Cloth  6 each Topical QHS  . collagenase   Topical Daily  . famotidine  20 mg Oral BID  . insulin aspart  0-15 Units Subcutaneous  TID WC  . insulin aspart  0-5 Units Subcutaneous QHS  . insulin detemir  25 Units Subcutaneous QHS  . magnesium oxide  400 mg Oral BID  . mouth rinse  15 mL Mouth Rinse BID  . multivitamin with minerals  1 tablet Oral Daily  . nystatin  5 mL Oral QID  . potassium chloride  30 mEq Oral BID  . protein supplement shake  11 oz Oral BID BM  . sodium chloride flush  10-40 mL Intracatheter Q12H  . spironolactone  25 mg Oral Daily  . torsemide  60 mg Oral Daily  . traZODone  50 mg Oral QHS  . vitamin C  500 mg Oral BID  . warfarin  2.5 mg Oral q1800  . Warfarin - Pharmacist Dosing Inpatient   Does not apply q1800    Infusions: . lactated ringers      PRN Medications: acetaminophen, acetaminophen-codeine, alum & mag hydroxide-simeth, bisacodyl, clonazepam, diphenhydrAMINE, guaiFENesin-dextromethorphan, lactated ringers, levalbuterol, polyethylene glycol, prochlorperazine **OR** prochlorperazine **OR** prochlorperazine, RESOURCE THICKENUP CLEAR, sodium chloride flush, sodium phosphate    Patient Profile   [name] [name] is a 67 y.o. 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   Directly admitted with persistent low cardiac output for milrinone initiation for home.  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 Acute/Chronic systolic CHF with biventricular failure-> cardiogenic shock: Echo  08/13/2017 EF 20-25%. s/p  Medtronic BiV ICD in place. Cath 12/18 with stable 1v CAD.  s/p HM-3 implant 6/17 under destination therapy. Unable to close chest due to high-intrathoracic pressures and RV failure. Taken back to OR 08/17/17 with evacuation of hematoma with improvement. Extubated 6/28.  Ramp ECHO 7/11 with speed increased to 5300.  - Volume status stable on torsemide 60 mg daily  - Continue spiro 25 mg daily.  2. 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 Ramp ECHO speed  increased to 5300.   - VAD interrogated personally. Parameters stable.   - Continue warfarin + ASA 81, INR 2.30 today.  - LDH 193 09/20/17 3. Acute on CKD II-III:  - Resolved.  4. [name]/VF: Recurrent VT on 6/22.  S/P VF 6/24 emergent bedside cardioversion. S/P VT 6/26 emergent bedside cardioversion. ICD turned back on.   - No change to current plan.   5. AFL/atrial fibrillation: S/p previous ablation. He is currently in atrial fibrillation.  - Rate controlled. Amio stopped.  - On coumadin. No bleeding.  6. [name]: Recent cath with  stable CAD as above:  - No s/s of ischemia.    7. [name]: Recent A1c 8.3 on 6/3.  - Continue current regimen. No change.  8. Anemia: Post-op.  Got 1 unit PRBCs 6/22 and 6/25. - Stable. Hgb 9.8 09/20/17 9. Deconditioning: Severe, working with PT aggressively.   - No change to current plan.   10. Unstageable Pressure Ulcer: R and L buttock.  R buttock with necrosis noted. Continue santyl for selective debridement. Change daily. Continue air overlay. Needs to reposition every hour when in the chair. Keep R /L in bed. . May need a different chair cushion. Added hydrotherapy.   - WOC following. No change.  11. 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 Consider ENT f/u as outpatient.  - Swallow study today.   I reviewed the LVAD parameters from today, and compared the results to the patient's prior recorded data.  No programming changes were made.  The LVAD is functioning within specified parameters.  The patient performs LVAD self-test daily.  LVAD interrogation was negative for any significant power changes, alarms or PI events/speed drops.  LVAD equipment check completed and is in good working order.  Back-up equipment present.   LVAD education done on emergency procedures and precautions and reviewed exit site care.   Length of Stay: [address]  Annamaria Helling  09/22/2017, 10:11 AM  Advanced Heart  Failure Team Pager [phone] (M-F; 7a - 4p)  Please contact [facility] for night-coverage after hours (4p -7a ) and weekends on [email or web address]  Patient seen and examined with the above-signed Advanced Practice Provider and/or Housestaff. I personally reviewed laboratory data, imaging studies and relevant notes. I independently examined the patient and formulated the important aspects of the plan. I have edited the note to reflect any of my changes or salient points. I have personally discussed the plan with the patient and/or family.  Continues to progress with rehab. Getting stronger and more independent. Repeat swallow study today. Volume status looks good. INR ok. No bleeding. Discussed dosing with PharmD personally. VAD interrogated personally. Parameters stable. Continue wound care.   Glori Bickers, MD  10:15 PM

## 2017-09-22 NOTE — Progress Notes (Signed)
[facility] for coumadin Indication: LVAD  Allergies  Allergen Reactions  . Penicillins Hives, Itching and Other (See Comments)    Has patient had a PCN reaction causing immediate rash, facial/tongue/throat swelling, SOB or lightheadedness with hypotension:# # Yes # # Has patient had a PCN reaction causing severe rash involving mucus membranes or skin necrosis: No Has patient had a PCN reaction that required hospitalization: No Has patient had a PCN reaction occurring within the last 10 years: No If all of the above answers are "NO", then may proceed with Cephalosporin use.     Patient Measurements: Height: 5\' 11"  (180.3 cm) Weight: 211 lb 10.3 oz (96 kg) IBW/kg (Calculated) : 75.3 Heparin Dosing Weight: 98.3 kg  Vital Signs:    Labs: Recent Labs    09/20/17 0349 09/21/17 0409 09/22/17 0348  HGB 9.8*  --   --   HCT 32.3*  --   --   PLT 209  --   --   LABPROT 23.5* 23.2* 25.1*  INR 2.11 2.08 2.30  CREATININE 1.05  --   --     Estimated Creatinine Clearance: 80.7 mL/min (by C-G formula based on SCr of 1.05 mg/dL).  Assessment: 43 yoM s/p LVAD on 6/17 on warfarin per Rx. Patient previously therapeutic on 3 mg dosing. Patient had post-op anemia with downtrending plts, now stable, no bleeding.  INR now trended back up overnight.   Goal of Therapy:  INR goal 2-2.5 Monitor platelets by anticoagulation protocol: Yes   Plan:   - Warfarin 2.5 mg daily - Suspect pt will need ~2.5mg  daily on discharge - Daily INR, CBC, LDH  Erin Hearing PharmD., BCPS Clinical Pharmacist 09/22/2017 9:00 AM

## 2017-09-22 NOTE — Progress Notes (Signed)
Physical Therapy Wound Treatment Patient Details  Name: SLYVESTER LATONA MRN: [id number] Date of Birth: 18-Jun-1950  Today's Date: 09/22/2017 PT Individual Time: 1107-1129 PT Individual Time Calculation (min): 22 min   Subjective  Subjective: Pt asking how wound was looking Patient and Family Stated Goals: Heal wound Prior Treatments: Wet to dry dressing changes with Santyl  Pain Score: Pain Score: 0-No pain  Wound Assessment  Pressure Injury 08/27/17 Unstageable - Full thickness tissue loss in which the base of the ulcer is covered by slough (yellow, tan, gray, green or brown) and/or eschar (tan, brown or black) in the wound bed. 8.5 x 6 (90% darkened 5% yellow and 5% pink (Active)  Dressing Type Gauze (Comment);Foam;Barrier Film (skin prep);Moist to dry 09/22/2017 12:00 PM  Dressing Clean;Dry;Intact;Changed 09/22/2017 12:00 PM  Dressing Change Frequency Daily 09/22/2017 12:00 PM  State of Healing Early/partial granulation 09/22/2017 12:00 PM  Site / Wound Assessment Pink;Yellow 09/22/2017 12:00 PM  % Wound base Red or Granulating 45% 09/22/2017 12:00 PM  % Wound base Yellow/Fibrinous Exudate 55% 09/22/2017 12:00 PM  % Wound base Black/Eschar 0% 09/22/2017 12:00 PM  % Wound base Other/Granulation Tissue (Comment) 0% 09/22/2017 12:00 PM  Peri-wound Assessment Intact 09/22/2017 12:00 PM  Wound Length (cm) 9.1 cm 09/20/2017 11:28 AM  Wound Width (cm) 7.5 cm 09/20/2017 11:28 AM  Wound Depth (cm) 0.2 cm 09/20/2017 11:28 AM  Wound Surface Area (cm^2) 68.25 cm^2 09/20/2017 11:28 AM  Wound Volume (cm^3) 13.65 cm^3 09/20/2017 11:28 AM  Tunneling (cm) 0 09/10/2017 10:51 AM  Undermining (cm) 0 09/10/2017 10:51 AM  Margins Unattached edges (unapproximated) 09/22/2017 12:00 PM  Drainage Amount Moderate 09/22/2017 12:00 PM  Drainage Description Purulent;No odor 09/22/2017 12:00 PM  Treatment Hydrotherapy (Pulse lavage);Packing (Saline gauze) 09/22/2017 12:00 PM   Santyl applied to wound bed prior to applying  dressing.    Hydrotherapy Pulsed lavage therapy - wound location: R buttock Pulsed Lavage with Suction (psi): 8 psi(8-12) Pulsed Lavage with Suction - Normal Saline Used: 1000 mL Pulsed Lavage Tip: Tip with splash shield   Wound Assessment and Plan  Wound Therapy - Assess/Plan/Recommendations Wound Therapy - Clinical Statement: Wound continues to have increased pink tissue throughout yellow area of the wound. Continue hydrotherapy for removal of necrotic tissue and to promote wound healing. Necrotic tissue adhering to base of wound. Unable to remove with selective debridement. Wound Therapy - Functional Problem List: Decreased tolerance for OOB and position changes Factors Delaying/Impairing Wound Healing: Diabetes Mellitus;Immobility;Multiple medical problems Hydrotherapy Plan: Debridement;Dressing change;Patient/family education;Pulsatile lavage with suction Wound Therapy - Frequency: 6X / week Wound Therapy - Follow Up Recommendations: [place] Wound Plan: See above  Wound Therapy Goals- Improve the function of patient's integumentary system by progressing the wound(s) through the phases of wound healing (inflammation - proliferation - remodeling) by: Decrease Necrotic Tissue to: 40 Decrease Necrotic Tissue - Progress: Progressing toward goal Increase Granulation Tissue to: 60 Increase Granulation Tissue - Progress: Progressing toward goal  Goals will be updated until maximal potential achieved or discharge criteria met.  Discharge criteria: when goals achieved, discharge from hospital, MD decision/surgical intervention, no progress towards goals, refusal/missing three consecutive treatments without notification or medical reason.  GP     Shary Decamp Maycok 09/22/2017, 12:03 PM [employer] PT [phone]

## 2017-09-23 ENCOUNTER — Inpatient Hospital Stay (HOSPITAL_COMMUNITY): Payer: PPO

## 2017-09-23 ENCOUNTER — Ambulatory Visit (HOSPITAL_COMMUNITY): Payer: PPO

## 2017-09-23 ENCOUNTER — Inpatient Hospital Stay (HOSPITAL_COMMUNITY): Payer: PPO | Admitting: Occupational Therapy

## 2017-09-23 ENCOUNTER — Inpatient Hospital Stay (HOSPITAL_COMMUNITY): Payer: PPO | Admitting: Speech Pathology

## 2017-09-23 ENCOUNTER — Inpatient Hospital Stay (HOSPITAL_COMMUNITY): Payer: PPO | Admitting: Physical Therapy

## 2017-09-23 DIAGNOSIS — Z7901 Long term (current) use of anticoagulants: Secondary | ICD-10-CM

## 2017-09-23 DIAGNOSIS — E876 Hypokalemia: Secondary | ICD-10-CM

## 2017-09-23 LAB — GLUCOSE, CAPILLARY
GLUCOSE-CAPILLARY: 96 mg/dL (ref 70–99)
GLUCOSE-CAPILLARY: 146 mg/dL — AB (ref 70–99)
Glucose-Capillary: 140 mg/dL — ABNORMAL HIGH (ref 70–99)
Glucose-Capillary: 134 mg/dL — ABNORMAL HIGH (ref 70–99)

## 2017-09-23 LAB — COMPREHENSIVE METABOLIC PANEL
ALT: 20 U/L (ref 0–44)
AST: 28 U/L (ref 15–41)
Albumin: 2.5 g/dL — ABNORMAL LOW (ref 3.5–5.0)
Alkaline Phosphatase: 89 U/L (ref 38–126)
Anion gap: 7 (ref 5–15)
BILIRUBIN TOTAL: 1 mg/dL (ref 0.3–1.2)
BUN: 15 mg/dL (ref 8–23)
CHLORIDE: 101 mmol/L (ref 98–111)
CO2: 25 mmol/L (ref 22–32)
Calcium: 8.8 mg/dL — ABNORMAL LOW (ref 8.9–10.3)
Creatinine, Ser: 1.02 mg/dL (ref 0.61–1.24)
Glucose, Bld: 123 mg/dL — ABNORMAL HIGH (ref 70–99)
Potassium: 3.9 mmol/L (ref 3.5–5.1)
Sodium: 133 mmol/L — ABNORMAL LOW (ref 135–145)
TOTAL PROTEIN: 7.7 g/dL (ref 6.5–8.1)

## 2017-09-23 LAB — CBC
HEMATOCRIT: 31.5 % — AB (ref 39.0–52.0)
Hemoglobin: 9.5 g/dL — ABNORMAL LOW (ref 13.0–17.0)
MCH: 24.7 pg — AB (ref 26.0–34.0)
MCHC: 30.2 g/dL (ref 30.0–36.0)
MCV: 81.8 fL (ref 78.0–100.0)
PLATELETS: 229 10*3/uL (ref 150–400)
RBC: 3.85 MIL/uL — ABNORMAL LOW (ref 4.22–5.81)
RDW: 18 % — AB (ref 11.5–15.5)
WBC: 4.2 10*3/uL (ref 4.0–10.5)

## 2017-09-23 LAB — PROTIME-INR
INR: 2.38
Prothrombin Time: 25.8 seconds — ABNORMAL HIGH (ref 11.4–15.2)

## 2017-09-23 LAB — LACTATE DEHYDROGENASE: LDH: 264 U/L — AB (ref 98–192)

## 2017-09-23 LAB — MAGNESIUM: MAGNESIUM: 2 mg/dL (ref 1.7–2.4)

## 2017-09-23 MED ORDER — IOHEXOL 300 MG/ML  SOLN
100.0000 mL | Freq: Once | INTRAMUSCULAR | Status: AC | PRN
  Administered 2017-09-23: 100 mL via INTRAVENOUS

## 2017-09-23 NOTE — Progress Notes (Signed)
[facility] PHYSICAL MEDICINE & REHABILITATION     PROGRESS NOTE    Subjective/Complaints: Patient seen lying in bed this morning. Family at bedside. He states he slept well overnight for the first time.  He was seen by heart failure team yesterday, notes reviewed.  ROS: denies CP, SOB, nausea, vomiting, diarrhea.  Objective:  No results found. Recent Labs    09/23/17 0437  WBC 4.2  HGB 9.5*  HCT 31.5*  PLT 229   Recent Labs    09/23/17 0437  NA 133*  K 3.9  CL 101  GLUCOSE 123*  BUN 15  CREATININE 1.02  CALCIUM 8.8*   CBG (last 3)  Recent Labs    09/22/17 1647 09/22/17 2153 09/23/17 0631  GLUCAP 130* 148* 96    Wt Readings from Last 3 Encounters:  09/23/17 96.2 kg (212 lb 1.3 oz)  09/10/17 97.8 kg (215 lb 8 oz)  07/22/17 103.9 kg (229 lb)     Intake/Output Summary (Last 24 hours) at 09/23/2017 0943 Last data filed at 09/23/2017 0456 Gross per 24 hour  Intake 720 ml  Output 1200 ml  Net -480 ml    Vital Signs: Blood pressure (!) 84/73, pulse 97, temperature 98 F (36.7 C), temperature source Oral, resp. rate 18, height 5\' 11"  (1.803 m), weight 96.2 kg (212 lb 1.3 oz), SpO2 95 %. Physical Exam:  Constitutional: No distress . Vital signs reviewed. HENT: Normocephalic.  Atraumatic. Eyes: EOMI. No discharge. Cardiovascular: RRR. No JVD. Respiratory: CTA bilaterally. Normal effort. GI: BS +. Non-distended. Musc: No edema or tenderness in extremities. Neurological: He is alert and oriented  dysphonia Motor: B/l UE 4/5 prox to distal. (stable) B/l LE: 4-/5 HF, 4/5 KE and 4+/5 ADF/PF. (stable) Skin: Skin is warm. Intact. Psychiatric: pleasant and cooperative   Assessment/Plan: 1. Functional deficits secondary to debility after CHF/LVAD/VDRF which require 2hr per day over 7 days of interdisciplinary therapy in a comprehensive inpatient rehab setting. Physiatrist is providing close team supervision and 24 hour management of active medical problems  listed below. Physiatrist and rehab team continue to assess barriers to discharge/monitor patient progress toward functional and medical goals.  Function:  Bathing Bathing position   Position: Sitting EOB  Bathing parts Body parts bathed by patient: Right arm, Left arm, Chest, Abdomen, Front perineal area, Right upper leg, Left upper leg, Right lower leg, Left lower leg Body parts bathed by helper: Buttocks  Bathing assist Assist Level: Touching or steadying assistance(Pt > 75%)      Upper Body Dressing/Undressing Upper body dressing   What is the patient wearing?: Pull over shirt/dress     Pull over shirt/dress - Perfomed by patient: Thread/unthread right sleeve, Thread/unthread left sleeve, Put head through opening, Pull shirt over trunk          Upper body assist Assist Level: Set up      Lower Body Dressing/Undressing Lower body dressing   What is the patient wearing?: Underwear, Pants, Socks, Shoes Underwear - Performed by patient: Thread/unthread right underwear leg, Thread/unthread left underwear leg, Pull underwear up/down   Pants- Performed by patient: Thread/unthread right pants leg, Pull pants up/down, Thread/unthread left pants leg Pants- Performed by helper: Thread/unthread left pants leg Non-skid slipper socks- Performed by patient: Don/doff right sock, Don/doff left sock Non-skid slipper socks- Performed by helper: Don/doff right sock, Don/doff left sock Socks - Performed by patient: Don/doff right sock, Don/doff left sock   Shoes - Performed by patient: Don/doff right shoe, Don/doff left shoe, Fasten  right, Fasten left            Lower body assist Assist for lower body dressing: Touching or steadying assistance (Pt > 75%)      Toileting Toileting Toileting activity did not occur: No continent bowel/bladder event Toileting steps completed by patient: Adjust clothing prior to toileting, Performs perineal hygiene, Adjust clothing after toileting Toileting  steps completed by helper: Performs perineal hygiene, Adjust clothing after toileting Toileting Assistive Devices: Grab bar or rail  Toileting assist Assist level: Touching or steadying assistance (Pt.75%)   Transfers Chair/bed transfer Chair/bed transfer activity did not occur: Refused Chair/bed transfer method: Ambulatory Chair/bed transfer assist level: Touching or steadying assistance (Pt > 75%) Chair/bed transfer assistive device: Armrests, Medical sales representative Ambulation activity did not occur: Refused   Max distance: 150 ft Assist level: Supervision or verbal cues   Wheelchair Wheelchair activity did not occur: Refused        Cognition Comprehension Comprehension assist level: Understands basic 90% of the time/cues < 10% of the time  Expression Expression assist level: Expresses basic 90% of the time/requires cueing < 10% of the time.  Social Interaction Social Interaction assist level: Interacts appropriately with others with medication or extra time (anti-anxiety, antidepressant).  Problem Solving Problem solving assist level: Solves basic 75 - 89% of the time/requires cueing 10 - 24% of the time  Memory Memory assist level: Recognizes or recalls 75 - 89% of the time/requires cueing 10 - 24% of the time   Medical Problem List and Plan:  1. Functional and mobility deficits secondary to debility after congestive heart failure/LVAD/VDRF and multiple other medical issues   Continue CIR -decreased therapy to 2 hrs per day over 7 days intensity   2. DVT Prophylaxis/Anticoagulation: Pharmaceutical: Coumadin   INR therapeutic on 7/25 3. Pain Management: tylenol prn.   -T#3 for hydrotherapy 4. Mood: LCSW to follow for evaluation and support.  5. Neuropsych: This patient is ?fully capable of making decisions on his own behalf.  6. Deep tissue injury bilateral buttocks/Skin/Wound Care: Air mattress overlay.     -Hydrotherapy to buttocks M->Sa. Area improving  -T#3 as  above 7. Fluids/Electrolytes/Nutrition: Strict I/Os. Continue nutritional supplement to promote wound healing  8. T2DM: Monitor BS ac/hs. Continue Lantus   Labile on 7/25 9. A fib: Off amiodarone.   10. 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 Started on [facility] 7/12. Continue Spironolactone and digoxin daily. Monitor renal status serially.  Filed Weights   09/21/17 0553 09/22/17 0500 09/23/17 0446  Weight: 96 kg (211 lb 10.3 oz) 96 kg (211 lb 10.3 oz) 96.2 kg (212 lb 1.3 oz)   Stable on 7/25  Appreciate heart failure recs 11. [name]/VF: S/p emergent cardioversion 6/24 and 6/26-->ICD turned on 7/12. To keep K> 4.0 and Mg> 2.0   Potassium supplement increased on 7/22  Improving on 7/25 12. ABLA: Improved with transfusion.    Hemoglobin 9.5 on 7/25 13. Dysphagia: Continue dysphagia 2, honey liquids with chin tuck.   -continue to push po liquids  MBS on 7/24, continue current diet  Advance diet as tolerated 14. LVAD implant 6/17: On coumadin and low dose ASA.   -Education ongoing with patient and wife. feeling more comfortable with process  -Dressing changes per VAD coordinator or trained caregiver.  15. Hyponatremia  Sodium 133 on 7/25  Continue to monitor 16. Sleep disturbance  Trazodone 50 started on 7/23, increased to 100 on 7/24   Improving.  LOS (Days)  [place] PERFORMED  Ankit Lorie Phenix, MD 09/23/2017 9:43 AM

## 2017-09-23 NOTE — Progress Notes (Signed)
[facility] for coumadin Indication: LVAD  Allergies  Allergen Reactions  . Penicillins Hives, Itching and Other (See Comments)    Has patient had a PCN reaction causing immediate rash, facial/tongue/throat swelling, SOB or lightheadedness with hypotension:# # Yes # # Has patient had a PCN reaction causing severe rash involving mucus membranes or skin necrosis: No Has patient had a PCN reaction that required hospitalization: No Has patient had a PCN reaction occurring within the last 10 years: No If all of the above answers are "NO", then may proceed with Cephalosporin use.     Patient Measurements: Height: 5\' 11"  (180.3 cm) Weight: 212 lb 1.3 oz (96.2 kg) IBW/kg (Calculated) : 75.3 Heparin Dosing Weight: 98.3 kg  Vital Signs: Temp: 98 F (36.7 C) (07/25 0446) Temp Source: Oral (07/25 0446) BP: 84/73 (07/25 0748) Pulse Rate: 97 (07/25 0748)  Labs: Recent Labs    09/21/17 0409 09/22/17 0348 09/23/17 0437  HGB  --   --  9.5*  HCT  --   --  31.5*  PLT  --   --  229  LABPROT 23.2* 25.1* 25.8*  INR 2.08 2.30 2.38  CREATININE  --   --  1.02    Estimated Creatinine Clearance: 83.2 mL/min (by C-G formula based on SCr of 1.02 mg/dL).  Assessment: 33 yoM s/p LVAD on 6/17 on warfarin per Rx. Patient previously therapeutic on 3 mg dosing.   INR stable on 2.5mg  daily, no bleeding issues noted.   Goal of Therapy:  INR goal 2-2.5 Monitor platelets by anticoagulation protocol: Yes   Plan:   - Warfarin 2.5 mg daily - Suspect this will be his home dose - Daily INR, CBC, LDH  Erin Hearing PharmD., BCPS Clinical Pharmacist 09/23/2017 1:38 PM

## 2017-09-23 NOTE — Progress Notes (Signed)
Physical Therapy Session Note  Patient Details  Name: [name] [name] MRN: [id number] Date of Birth: 08/16/1950  Today's Date: 09/23/2017 PT Individual Time: 0930-1027 PT Individual Time Calculation (min): 57 min   Short Term Goals: Week 2:  PT Short Term Goal 1 (Week 2): = LTG  Skilled Therapeutic Interventions/Progress Updates:    pt able to perform LVAD from wall to battery power with supervision and increased time. Pt performs gait throughout unit with RW and supervision.  Stair negotiation 4 steps x 2 with 1 handrail with close supervision/min guard.  Standing tolerance and balance with ball kick and ball toss with supervision for balance, pt able to stand 3-4 minutes before seated rest.  Standing therex for LE strengthening 2 x 10 mini squats, heel raises, hip abd, HS curls.  Pt improving strength and activity tolerance requiring lest frequent rest breaks this session. Pt left in bed with needs at hand, family present.  Therapy Documentation Precautions:  Precautions Precautions: Sternal, Fall Precaution Comments: LVAD, sacral wound Restrictions Weight Bearing Restrictions: No Pain: Pain Assessment Pain Scale: 0-10 Pain Score: 0-No pain  Therapy/Group: Individual Therapy  [name] 09/23/2017, 10:27 AM

## 2017-09-23 NOTE — Progress Notes (Signed)
Existing VAD dressing removed and site care performed using sterile technique. Drive line exit site cleaned with Chlora prep applicators x 2, allowed to dry, and gauze dressing with silver strip re-applied. Exit site uncorporated, the velour is exposed @ 1/4 inche with respirations. Pt still c/o burning with alcohol on areas under tape; re-positioned anchor, adapted dressing to smaller size in attempt to relieve irritated skin of tape application. Exit site with small amount tan drainage, no redness or foul odor noted. Drive line anchor replaced. Will continue every other day dressing changes.   Reviewed with Dr. Darcey Nora - pack open area with silver strip.        Zada Girt RN, VAD Coordinator 24/7 VAD pager: [phone]

## 2017-09-23 NOTE — Progress Notes (Signed)
Pt and Nurse transport down to CT. Procedure completed. Returned to room via bed. Side rails up call bell placed within reach. Will cont to monitor.  Pamella Pert, LPN

## 2017-09-23 NOTE — Progress Notes (Addendum)
Patient ID: [name] [name], male   DOB: 05/26/1950, 67 y.o.   MRN: [id number] P    Advanced Heart Failure Rounding Note  PCP-Cardiologist: No primary care provider on file.   Subjective:    Events 6/17 Underwent HM-3 implant -> Unable to close chest with RV failure and high intrathoracic pressure 6/18 Taken back to OR for evacuation of hematoma. NO RVAD needed. Left sternum open  08/19/17 Taken back to OR for sternal closure 6/22 VT => amiodarone started.  6/24 VT/VF=>urgent cardioversion. 6/25 VT  =>urgent cardioversion  6/27 Extubated 7/12 to CIR   INR 2.38. MAPs stable.   Feeling good today. Excited about home Saturday. Unable to advance diet from Dys 2/Honey yesterday. Denies SOB, dizziness, or lightheadedness. No bleeding. Continues to complain of buttock wound pain.   LVAD Interrogation HM 3: Speed: 5300 Flow: 3.8 PI: 4.3 Power: 4.0. No alarms.   Objective:   Weight Range: 212 lb 1.3 oz (96.2 kg) Body mass index is 29.58 kg/m.   Vital Signs:   Temp:  [98 F (36.7 C)] 98 F (36.7 C) (07/25 0446) Pulse Rate:  [48-97] 97 (07/25 0748) Resp:  [17-18] 18 (07/25 0446) BP: (84-91)/(73-76) 84/73 (07/25 0748) SpO2:  [95 %-96 %] 95 % (07/25 0446) Weight:  [212 lb 1.3 oz (96.2 kg)] 212 lb 1.3 oz (96.2 kg) (07/25 0446) Last BM Date: 09/22/17   MAP 70-80s  Weight change: Filed Weights   09/21/17 0553 09/22/17 0500 09/23/17 0446  Weight: 211 lb 10.3 oz (96 kg) 211 lb 10.3 oz (96 kg) 212 lb 1.3 oz (96.2 kg)   Intake/Output:   Intake/Output Summary (Last 24 hours) at 09/23/2017 0825 Last data filed at 09/23/2017 0456 Gross per 24 hour  Intake 1080 ml  Output 1200 ml  Net -120 ml   Physical Exam   General: NAD Hoarse.  HEENT: Normal. Neck: Supple, JVP 7-8 cm. Carotids OK.  Cardiac:  Mechanical heart sounds with LVAD hum present.  Lungs:  CTAB, normal effort.  Abdomen:  NT, ND, no HSM. No bruits or masses. +BS  LVAD exit site: Dressing dry and intact. No erythema  or drainage. Stabilization device present and accurately applied. Driveline dressing changed daily per sterile technique. See VAD coordinator note from 09/22/17 Extremities:  Warm and dry. No cyanosis, clubbing, rash, or edema. Wound dressing on buttocks.  Neuro:  Alert & oriented x 3. Cranial nerves grossly intact. Moves all 4 extremities w/o difficulty. Affect pleasant     Telemetry   Not connected.   Labs    CBC Recent Labs    09/23/17 0437  WBC 4.2  HGB 9.5*  HCT 31.5*  MCV 81.8  PLT 376   Basic Metabolic Panel Recent Labs    09/23/17 0437  NA 133*  K 3.9  CL 101  CO2 25  GLUCOSE 123*  BUN 15  CREATININE 1.02  CALCIUM 8.8*  MG 2.0   Liver Function Tests Recent Labs    09/23/17 0437  AST 28  ALT 20  ALKPHOS 89  BILITOT 1.0  PROT 7.7  ALBUMIN 2.5*   No results for input(s): LIPASE, AMYLASE in the last 72 hours. Cardiac Enzymes No results for input(s): CKTOTAL, CKMB, CKMBINDEX, TROPONINI in the last 72 hours.  BNP: BNP (last 3 results) Recent Labs    09/05/17 2327 09/13/17 0331 09/20/17 0349  BNP 86.1 53.3 56.0    ProBNP (last 3 results) Recent Labs    11/12/16 1559  PROBNP 388.0*  D-Dimer No results for input(s): DDIMER in the last 72 hours. Hemoglobin A1C No results for input(s): HGBA1C in the last 72 hours. Fasting Lipid Panel No results for input(s): CHOL, HDL, LDLCALC, TRIG, CHOLHDL, LDLDIRECT in the last 72 hours. Thyroid Function Tests No results for input(s): TSH, T4TOTAL, T3FREE, THYROIDAB in the last 72 hours.  Invalid input(s): FREET3  Other results:   Imaging    No results found.   Medications:     Scheduled Medications: . aspirin  81 mg Oral Daily  . bisacodyl  10 mg Oral Daily   Or  . bisacodyl  10 mg Rectal Daily  . Chlorhexidine Gluconate Cloth  6 each Topical QHS  . collagenase   Topical Daily  . famotidine  20 mg Oral BID  . insulin aspart  0-15 Units Subcutaneous TID WC  . insulin aspart  0-5  Units Subcutaneous QHS  . insulin detemir  25 Units Subcutaneous QHS  . magnesium oxide  400 mg Oral BID  . mouth rinse  15 mL Mouth Rinse BID  . multivitamin with minerals  1 tablet Oral Daily  . nystatin  5 mL Oral QID  . potassium chloride  30 mEq Oral BID  . protein supplement shake  11 oz Oral BID BM  . sodium chloride flush  10-40 mL Intracatheter Q12H  . spironolactone  25 mg Oral Daily  . torsemide  60 mg Oral Daily  . traZODone  50 mg Oral QHS  . vitamin C  500 mg Oral BID  . warfarin  2.5 mg Oral q1800  . Warfarin - Pharmacist Dosing Inpatient   Does not apply q1800    Infusions: . lactated ringers      PRN Medications: acetaminophen, acetaminophen-codeine, alum & mag hydroxide-simeth, bisacodyl, clonazepam, diphenhydrAMINE, guaiFENesin-dextromethorphan, lactated ringers, levalbuterol, polyethylene glycol, prochlorperazine **OR** prochlorperazine **OR** prochlorperazine, RESOURCE THICKENUP CLEAR, sodium chloride flush, sodium phosphate    Patient Profile   [name] [name] is a 67 y.o. 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   Directly admitted with persistent low cardiac output for milrinone initiation for home.  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 Acute/Chronic systolic CHF with biventricular failure-> cardiogenic shock: Echo  08/13/2017 EF 20-25%. s/p  Medtronic BiV ICD in place. Cath 12/18 with stable 1v CAD.  s/p HM-3 implant 6/17 under destination therapy. Unable to close chest due to high-intrathoracic pressures and RV failure. Taken back to OR 08/17/17 with evacuation of hematoma with improvement. Extubated 6/28.  Ramp ECHO 7/11 with speed increased to 5300.  - Volume status stable on torsemide 60 mg daily  - Continue spiro 25 mg daily.  2. 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 Ramp ECHO speed increased to 5300.   - VAD  interrogated personally. Parameters stable.   - Continue warfarin + ASA 81, INR 2.38 today.  - LDH 264. Follow.  3. Acute on CKD II-III:  - Resolved. 4. [name]/VF: Recurrent VT on 6/22.  S/P VF 6/24 emergent bedside cardioversion. S/P VT 6/26 emergent bedside cardioversion. ICD turned back on.   - No change to current plan.   5. AFL/atrial fibrillation: S/p previous ablation. He is currently in atrial fibrillation.  - Rate controlled. Amio stopped.  - On coumadin. No bleeding.   6. [name]: Recent cath  with stable CAD as above:  - No s/s of ischemia.    7. [name]: Recent A1c 8.3 on 6/3.  - Continue current regimen. No change.  8. Anemia: Post-op.  Got 1 unit PRBCs 6/22 and 6/25. - Stable. Hgb 9.5 today.  9. Deconditioning: Severe, working with PT aggressively.   - No change to current plan.   10. Unstageable Pressure Ulcer: R and L buttock.  R buttock with necrosis noted. Continue santyl for selective debridement. Change daily. Continue air overlay. Needs to reposition every hour when in the chair. Keep R /L in bed. . May need a different chair cushion. Added hydrotherapy.   - WOC following. No change.   11. 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 Consider ENT f/u as outpatient.  - Remains on Dys 2/Honey diet.   I reviewed the LVAD parameters from today, and compared the results to the patient's prior recorded data.  No programming changes were made.  The LVAD is functioning within specified parameters.  The patient performs LVAD self-test daily.  LVAD interrogation was negative for any significant power changes, alarms or PI events/speed drops.  LVAD equipment check completed and is in good working order.  Back-up equipment present.   LVAD education done on emergency procedures and precautions and reviewed exit site care.  Length of Stay: [place], [place]  09/23/2017, 8:25 AM  Advanced Heart Failure Team Pager  [phone] (M-F; 7a - 4p)  Please contact [facility] for night-coverage after hours (4p -7a ) and weekends on [email or web address]  Patient seen and examined with the above-signed Advanced Practice Provider and/or Housestaff. I personally reviewed laboratory data, imaging studies and relevant notes. I independently examined the patient and formulated the important aspects of the plan. I have edited the note to reflect any of my changes or salient points. I have personally discussed the plan with the patient and/or family.  Continues to progress with rehab. Unfortunately failed swallow study again. Remains on thickened diet. Buttock wound still sore. INR ok. Discussed dosing with PharmD personally.VAD interrogated personally. Parameters stable. Plan is for d/c home on Saturday.   Glori Bickers, MD  10:59 PM

## 2017-09-23 NOTE — Progress Notes (Signed)
Per PA Pam Love, ok to DC PICC after completion of CT scan. Will notify pt.  K Morey Andonian, LPN

## 2017-09-23 NOTE — Discharge Summary (Signed)
Physician Discharge Summary  Patient ID: [name] [name] MRN: [id number] DOB/AGE: [birth year] 67 y.o.  Admit date: 09/10/2017 Discharge date: 09/27/2017  Discharge Diagnoses:  Principal Problem:   Debility Active Problems:   Chronic anticoagulation   Chronic systolic CHF (congestive heart failure) (HCC)   Presence of left ventricular assist device (LVAD) (HCC)   Stage 3 chronic kidney disease (HCC)   Acute blood loss anemia   Dysphagia   Diabetes mellitus type 2 in nonobese (HCC)   Labile blood glucose   Sleep disturbance   Rectal mass   Irregular prostate   Decubitus ulcer of sacral region, stage 2   Discharged Condition: stable   Significant Diagnostic Studies:  Ct Pelvis W Contrast Result Date: 09/23/2017 CLINICAL DATA:  Pressure sores EXAM: CT PELVIS WITH CONTRAST TECHNIQUE: Multidetector CT imaging of the pelvis was performed using the standard protocol following the bolus administration of intravenous contrast. CONTRAST:  123mL OMNIPAQUE IOHEXOL 300 MG/ML  SOLN COMPARISON:  None. FINDINGS: Urinary Tract:  Bladder is within normal limits. Bowel: Node disproportionate dilatation of bowel to suggest obstruction. There is irregular wall thickening of the lower rectum. See image 93. Vascular/Lymphatic: No abnormal adenopathy. No evidence of aortic aneurysm. 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 Atherosclerotic changes are noted. 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 Other: There is stranding within the subcutaneous fat of the gluteal region associated with skin thickening. There is no underlying abscess. Musculoskeletal: No acute fracture. No obvious destructive bone lesion. Advanced degenerative disc disease at L4-5. There is facet arthropathy at L4-5 and L5-S1. 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  Correlate with PSA and physical exam. Irregular rectal wall thickening. Mass along the wall of the rectum  cannot be excluded. Correlate with physical exam and endoscopy. 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 Skin thickening and associated inflammatory changes in the underlying fat of the gluteal regions. Lumbar degenerative disc disease. Electronically Signed   By: Marybelle Killings M.D.   On: 09/23/2017 14:57    Labs:  Basic Metabolic Panel: BMP Latest Ref Rng & Units 09/23/2017 09/20/2017 09/17/2017  Glucose 70 - 99 mg/dL 123(H) 84 100(H)  BUN 8 - 23 mg/dL 15 18 18   Creatinine 0.61 - 1.24 mg/dL 1.02 1.05 1.22  Sodium 135 - 145 mmol/L 133(L) 134(L) 136  Potassium 3.5 - 5.1 mmol/L 3.9 3.7 3.7  Chloride 98 - 111 mmol/L 101 98 99  CO2 22 - 32 mmol/L 25 26 28   Calcium 8.9 - 10.3 mg/dL 8.8(L) 8.9 9.0    CBC: CBC Latest Ref Rng & Units 09/24/2017 09/23/2017 09/20/2017  WBC 4.0 - 10.5 K/uL 4.8 4.2 4.5  Hemoglobin 13.0 - 17.0 g/dL 9.8(L) 9.5(L) 9.8(L)  Hematocrit 39.0 - 52.0 % 32.9(L) 31.5(L) 32.3(L)  Platelets 150 - 400 K/uL 249 229 209    CBG: Recent Labs  Lab 09/24/17 0656 09/24/17 1203 09/24/17 1626 09/24/17 2200 09/25/17 0643  GLUCAP 83 114* 141* 107* 94   Lab Results  Component Value Date   INR 2.61 09/25/2017   INR 2.40 09/24/2017   INR 2.38 09/23/2017    Brief HPI:   [name] [name] is a 67 year old male with past medical history of ventricular tachycardia, ICM s/p ICD, hypertension, 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  He was admitted with plans for home milrinone initiation but with  poor results and was not deemed to be candidate for heart transplant per [facility] evaluation. He underwent LVAD placement on 08/16/2017 and chest not closed due to high intrathoracic pressures and RV failure. He was taken back to OR on 06/18 for hematoma requiring evacuation on 6/18 and underwent delayed chest closure on 6/20.  Hospital course significant for cardiogenic  shock requiring pressors, VDRF. VT/V fib treated with urgent cardioversion x2, ABLA, volume overload, leukocytosis, encephalopathy and dysphagia felt to be due to prolonged intubation.  Fluid overload has improved with IV diuresis and milrinone was weaned weaned off.  Fevers have resolved on meropenem and blood cultures x2 have been negative.  He had complaints of buttock pain and deep tissue injury noted therefore hydrotherapy initiated for wound care.  Mentation and activity tolerance are improving but he continues to be debilitated and CIR was recommended due to decline in functional status.   Hospital Course: [name] [name] was admitted to rehab 09/10/2017 for inpatient therapies to consist of PT, ST and OT at least three hours five days a week. Past admission physiatrist, therapy team and rehab RN have worked together to provide customized collaborative inpatient rehab. His weights were monitored daily and have been stable without signs of overload. Diabetes has been monitored on ac/hs basis and BS are reasonable on Levemir daily. He is to continue to hold Trulicity for now and follow up with primary for further titration in diabetes medications after discharge. Mood has improved with increase in po intake. Vitamins and nutritional supplements were added to help promote wound healing. Dysphagia is resolving and diet has been advanced to dysphagia 3, nectar liquids with chin tuck.   Heart failure team has been following for management of cardiac issues, VAD interrogation and dressing changes.  Coumadin has been dosed by pharmacy with INR goal 2-2.5.  Serial CBC showed that H/H and platelets are stable with INR 2.61 at discharge. He was discharged on 2.5 mg daily with next protime to be drawn by [facility] on 07/30 with results to Dr. [name]. Serial CBC showed H/H is stable without leucocytosis. Check of lytes showed mild hyponatremia and Kdur was increased due to hypokalemia. fllow with stable potassium and  renal status. Blood pressures were monitored on bid basis and were noted to inconsistent on dynamap therefore ultrasound was used for BP checks. He has had occasional orthostatic symptoms that resolve with rest.  Heart rate has been controlled and no ICD discharges during his stay.  He has tolerated increase in activity without ischemia or SOB.   He was reported to develop moderate amount of tan/milky drainage from exit site and was started on doxycyline per Cardiology recommendations.  Hydrotherapy has been ongoing during his stay with increase in granulation to tissue to 60% and decrease in necrotic tissue to 40%. He reported increased pain in sacral area on 7/25 and CT pelvis done to rule out infection. This was negative for infection or osteo but showed lobulated prostate and rectal wall thickening with question of mass along the wall. Dr. Lynne Leader office was contacted and will call patient with appointment. PSA- 1.16 and WNL. He has been referred to GU for further work up. Trazodone was added to help with insomnia and this was scheduled to help with sleep hygiene.  His wife has been supportive and has been here for most therapy and to handle VAD dressing changes.  He has progressed to supervision level and will continue to receive follow up [place] [place] [place] [facility] [place] by  [facility] after discharge.    Rehab course: During patient's stay in rehab weekly team conferences were held to monitor patient's progress, set goals and discuss barriers to discharge. At admission, patient required mod assist with mobility and basic self care tasks. He demonstrated aphonia impacting speech intelligibility and needed mod cues to recall sternal precautions. Cognitive evaluation showed borderline impairments in memory and min to mod assist required for processing and problem solving.  He  has had improvement in activity tolerance, balance, postural control as well as ability to compensate for deficits.  He is able to  complete ADL tasks with supervision. He is able to switch LVAD power from wall to batteries and vice versa without cues. He is able to complete ADL task at supervision level. He is modified independent for transfers and is able to ambulate 200' with RW and cues for safety. Family education was completed with wife regarding all aspects of care, assistance with cognitive tasks as well as mobility.     Disposition:  Home   Diet: Heart healthy/Diabetic.   Special Instructions: 1. HHRN to check protime on 7/30 with results to Dr. [name]. 2. Will need BMET rechecked in 5-7 days to monitor Potassium levels.     Allergies as of 09/25/2017      Reactions   Penicillins Hives, Itching, Other (See Comments)   Has patient had a PCN reaction causing immediate rash, facial/tongue/throat swelling, SOB or lightheadedness with hypotension:# # Yes # # Has patient had a PCN reaction causing severe rash involving mucus membranes or skin necrosis: No Has patient had a PCN reaction that required hospitalization: No Has patient had a PCN reaction occurring within the last 10 years: No If all of the above answers are "NO", then may proceed with Cephalosporin use.      Medication List    STOP taking these medications   acetaminophen 500 MG tablet Commonly known as:  TYLENOL   atorvastatin 80 MG tablet Commonly known as:  LIPITOR   feeding supplement (ENSURE ENLIVE) Liqd   insulin aspart 100 UNIT/ML injection Commonly known as:  novoLOG   levalbuterol 0.63 MG/3ML nebulizer solution Commonly known as:  XOPENEX   mouth rinse Liqd solution   ondansetron 4 MG/2ML Soln injection Commonly known as:  ZOFRAN   potassium chloride 20 MEQ packet Commonly known as:  KLOR-CON Replaced by:  potassium chloride 10 MEQ tablet     TAKE these medications   ascorbic acid 500 MG tablet Commonly known as:  VITAMIN C Take 1 tablet (500 mg total) by mouth 2 (two) times daily.   aspirin 81 MG chewable  tablet Chew 1 tablet (81 mg total) by mouth daily.   bisacodyl 5 MG EC tablet Commonly known as:  DULCOLAX Take 1-2 tablets (5-10 mg total) by mouth daily as needed for moderate constipation. What changed:    how much to take  when to take this  reasons to take this  Another medication with the same name was removed. Continue taking this medication, and follow the directions you see here.   clonazePAM 0.25 MG disintegrating tablet Commonly known as:  KLONOPIN Take 1 tablet (0.25 mg total) by mouth 2 (two) times daily as needed (anxiety).   collagenase ointment Commonly known as:  SANTYL Apply topically daily. Apply layer to area, cover with damp dressing. Then cover with bulky dry dressing and tape in place. What changed:  additional instructions   doxycycline 100 MG tablet Commonly known as:  VIBRA-TABS Take  1 tablet (100 mg total) by mouth every 12 (twelve) hours.   famotidine 20 MG tablet Commonly known as:  PEPCID Take 1 tablet (20 mg total) by mouth 2 (two) times daily.   Insulin Detemir 100 UNIT/ML Pen Commonly known as:  LEVEMIR FLEXTOUCH Inject 25 Units into the skin at bedtime. Notes to patient:  Use instead of 70/30 insulin   Insulin Pen Needle 32G X 6 MM Misc Commonly known as:  COMFORT EZ PEN NEEDLES 1 application by Does not apply route at bedtime.   magnesium oxide 400 (241.3 Mg) MG tablet Commonly known as:  MAG-OX Take 1 tablet (400 mg total) by mouth 2 (two) times daily.   multivitamin with minerals Tabs tablet Take 1 tablet by mouth daily.   nystatin 100000 UNIT/ML suspension Commonly known as:  MYCOSTATIN Take 5 mLs (500,000 Units total) by mouth 4 (four) times daily. Notes to patient:  Can use as needed   potassium chloride 10 MEQ tablet Commonly known as:  K-DUR Take 3 tablets (30 mEq total) by mouth 2 (two) times daily. Replaces:  potassium chloride 20 MEQ packet   protein supplement shake Liqd Commonly known as:  PREMIER PROTEIN Take  325 mLs (11 oz total) by mouth 2 (two) times daily between meals.   RESOURCE THICKENUP CLEAR Powd Take 1 g by mouth as needed. What changed:  how much to take   spironolactone 25 MG tablet Commonly known as:  ALDACTONE Take 1 tablet (25 mg total) by mouth daily.   torsemide 20 MG tablet Commonly known as:  DEMADEX Take 3 tablets (60 mg total) by mouth daily.   traZODone 50 MG tablet Commonly known as:  DESYREL Take 1 tablet (50 mg total) by mouth at bedtime. Notes to patient:  For sleep   warfarin 2.5 MG tablet Commonly known as:  COUMADIN Take 1 tablet (2.5 mg total) by mouth daily at 6 PM. What changed:    medication strength  how much to take  when to take this      Follow-up Information    [name], NP Follow up on 10/15/2017.   Specialty:  Nurse Practitioner Why:  @ 1:00 pm for hospital follow up appt.  Please arrive by 12:45pm. Contact information: [address] [place] [address] [phone]        Meredith Staggers, MD Follow up.   Specialty:  Physical Medicine and Rehabilitation Why:  call as needed Contact information: [place] [address] [phone]        Jolaine Artist, MD Follow up on 10/04/2017.   Specialty:  Cardiology Why:  at [place] information: [place] [address] [place] Follow up on 09/24/2017.   Why:  Office will call you with follow up appointment Contact information: [place] [facility] Follow up.   Specialty:  Gastroenterology Why:  Offic will call you with follow up appointment Contact information: [address] [place] [address] [phone]          Signed: Bary Leriche 09/27/2017, 12:25 PM

## 2017-09-23 NOTE — Progress Notes (Signed)
Speech Language Pathology Daily Session Note  Patient Details  Name: [name] [name] MRN: [id number] Date of Birth: 06-Nov-1950  Today's Date: 09/23/2017 SLP Individual Time: 1210-1230 SLP Individual Time Calculation (min): 20 min  Short Term Goals: Week 2: SLP Short Term Goal 1 (Week 2): Pt will consume dys 3 textures and nectar thick liquids with mod I use of swallowing precautions and minimal overt s/s of aspiration.  SLP Short Term Goal 1 - Progress (Week 2): Updated due to goal met SLP Short Term Goal 2 (Week 2): Pt will consume therapeutic trials of ice chips with supervision cues for use of swallowing precautions and minimal overt s/s of aspiration.  SLP Short Term Goal 3 (Week 2): Pt will recall new information with supervision cues for use of external aids.   SLP Short Term Goal 4 (Week 2): Pt will complete semi-complex tasks with min cues for functional problem solving.   SLP Short Term Goal 5 (Week 2): Pt will return demonstration of EMST trainer with mod I and for 75 repetitions a self reported effort level of <7/10.  SLP Short Term Goal 6 (Week 2): Pt will increase vocal intensity to achieve intelligibility at the sentence level with min assist.   Skilled Therapeutic Interventions:  Pt was seen for skilled ST targeting dysphagia goals.  Pt was received partially reclined in bed with reports of increased pain around wound following hydrotherapy but was agreeable to sitting at edge of bed for safe PO intake during lunch meal.  SLP facilitated the session with a trial tray of dys 3 textures and nectar thick liquids to continue working towards diet progression post FEES.  Pt utilized swallowing precautions with mod I and demonstrated no overt s/s of aspiration with advanced solids or liquids.  As a result, recommend advancing pt's diet to dys 3 textures and nectar thick liquids with full supervision for use of swallowing precautions.  Pt was left in bed with call bell within reach.   Continue per current plan of care.     Function:  Eating Eating   Modified Consistency Diet: Yes Eating Assist Level: Swallowing techniques: self managed           Cognition Comprehension Comprehension assist level: Follows basic conversation/direction with no assist  Expression   Expression assist level: Expresses basic 75 - 89% of the time/requires cueing 10 - 24% of the time. Needs helper to occlude trach/needs to repeat words.  Social Interaction Social Interaction assist level: Interacts appropriately with others with medication or extra time (anti-anxiety, antidepressant).  Problem Solving Problem solving assist level: Solves basic 75 - 89% of the time/requires cueing 10 - 24% of the time  Memory Memory assist level: Recognizes or recalls 75 - 89% of the time/requires cueing 10 - 24% of the time    Pain Pain Assessment Pain Scale: 0-10 Pain Score: 5  Pain Type: Acute pain Pain Location: Buttocks Pain Descriptors / Indicators: Aching Pain Intervention(s): Repositioned  Therapy/Group: Individual Therapy  Sherilyn Windhorst, Selinda Orion 09/23/2017, 8:26 PM

## 2017-09-23 NOTE — Progress Notes (Signed)
Physical Therapy Wound Treatment Patient Details  Name: [name] [name] MRN: [id number] Date of Birth: Dec 29, 1950  Today's Date: 09/23/2017 PT Individual Time: 8811- 1140 PT Individual Time Calculation (min): 45 min   Subjective  Subjective: Pt with complaints of increased pain and itching in gluteal fold Patient and Family Stated Goals: Heal wound Prior Treatments: Wet to dry dressing changes with Santyl  Pain Score: Pain Score: 7  Pt c/o of increased pain with palpation of periwound medially and superior medially  Wound Assessment  Pressure Injury 08/27/17 Unstageable - Full thickness tissue loss in which the base of the ulcer is covered by slough (yellow, tan, gray, green or brown) and/or eschar (tan, brown or black) in the wound bed. 8.5 x 6 (90% darkened 5% yellow and 5% pink (Active)  Wound Image   09/20/2017 11:28 AM  Dressing Type Gauze (Comment);Barrier Film (skin prep);Moist to dry 09/23/2017 11:00 AM  Dressing Clean;Dry;Intact;Changed 09/23/2017 11:00 AM  Dressing Change Frequency Daily 09/23/2017 11:00 AM  State of Healing Early/partial granulation 09/23/2017 11:00 AM  Site / Wound Assessment Pink;Yellow 09/23/2017 11:00 AM  % Wound base Red or Granulating 45% 09/23/2017 11:00 AM  % Wound base Yellow/Fibrinous Exudate 55% 09/23/2017 11:00 AM  % Wound base Black/Eschar 0% 09/23/2017 11:00 AM  % Wound base Other/Granulation Tissue (Comment) 0% 09/23/2017 11:00 AM  Peri-wound Assessment Intact;Other (Comment) 09/23/2017 11:00 AM  Wound Length (cm) 9.1 cm 09/20/2017 11:28 AM  Wound Width (cm) 7.5 cm 09/20/2017 11:28 AM  Wound Depth (cm) 0.2 cm 09/20/2017 11:28 AM  Wound Surface Area (cm^2) 68.25 cm^2 09/20/2017 11:28 AM  Wound Volume (cm^3) 13.65 cm^3 09/20/2017 11:28 AM  Tunneling (cm) 0 09/10/2017 10:51 AM  Undermining (cm) 0 09/10/2017 10:51 AM  Margins Unattached edges (unapproximated) 09/23/2017 11:00 AM  Drainage Amount Moderate 09/23/2017 11:00 AM  Drainage Description Purulent;No  odor 09/23/2017 11:00 AM  Treatment Hydrotherapy (Pulse lavage);Packing (Saline gauze) 09/23/2017 11:00 AM   Santyl applied to wound bed   Hydrotherapy Pulsed lavage therapy - wound location: R buttock Pulsed Lavage with Suction (psi): 8 psi(8-12) Pulsed Lavage with Suction - Normal Saline Used: 1000 mL Pulsed Lavage Tip: Tip with splash shield   Wound Assessment and Plan  Wound Therapy - Assess/Plan/Recommendations Wound Therapy - Clinical Statement: Pt with c/o increased pain 7/10 medially in gluteal fold. Pt experiences increased pain with palpation superior and medially of R buttock pain, skin in this area is darkened and boggy with palpation. WOC RN consulted and agreed to concern for possible fluid filled abcess. Rehab PA to order CT for rule out. Necrotic tissue in wound bed continues to be closely adhered and unable to remove with selective debridement. Continue hydrotherapy for removal of necrotic tissue to decreased bioburden to promote healing.  Wound Therapy - Functional Problem List: Decreased tolerance for OOB and position changes Factors Delaying/Impairing Wound Healing: Diabetes Mellitus;Immobility;Multiple medical problems Hydrotherapy Plan: Dressing change;Patient/family education;Pulsatile lavage with suction Wound Therapy - Frequency: 6X / week Wound Therapy - Follow Up Recommendations: [place] Wound Plan: See above  Wound Therapy Goals- Improve the function of patient's integumentary system by progressing the wound(s) through the phases of wound healing (inflammation - proliferation - remodeling) by: Decrease Necrotic Tissue to: 40 Decrease Necrotic Tissue - Progress: Progressing toward goal Increase Granulation Tissue to: 60 Increase Granulation Tissue - Progress: Progressing toward goal  Goals will be updated until maximal potential achieved or discharge criteria met.  Discharge criteria: when goals achieved, discharge from hospital, MD decision/surgical  intervention, no progress towards goals, refusal/missing three consecutive treatments without notification or medical reason.  GP    Dani Gobble. Migdalia Dk PT, DPT Acute Rehabilitation  [phone] Pager [phone]   [place] 09/23/2017, 12:09 PM

## 2017-09-23 NOTE — Progress Notes (Signed)
Occupational Therapy Note  Patient Details  Name: [name] [name] MRN: [id number] Date of Birth: Jun 11, 1950  Today's Date: 09/23/2017 OT Missed Time: 75 Minutes Missed Time Reason: CT/MRI  Pt off unit for CT scan during scheduled OT intervention. OT will attempt again at next available time.    Gypsy Decant 09/23/2017, 2:33 PM

## 2017-09-24 ENCOUNTER — Inpatient Hospital Stay (HOSPITAL_COMMUNITY): Payer: PPO | Admitting: Physical Therapy

## 2017-09-24 ENCOUNTER — Inpatient Hospital Stay (HOSPITAL_COMMUNITY): Payer: PPO | Admitting: Speech Pathology

## 2017-09-24 ENCOUNTER — Telehealth: Payer: Self-pay | Admitting: Physician Assistant

## 2017-09-24 ENCOUNTER — Encounter (HOSPITAL_COMMUNITY): Payer: Self-pay

## 2017-09-24 ENCOUNTER — Telehealth (HOSPITAL_COMMUNITY): Payer: Self-pay

## 2017-09-24 ENCOUNTER — Ambulatory Visit (HOSPITAL_COMMUNITY): Payer: PPO

## 2017-09-24 ENCOUNTER — Inpatient Hospital Stay (HOSPITAL_COMMUNITY): Payer: PPO | Admitting: Occupational Therapy

## 2017-09-24 DIAGNOSIS — K6289 Other specified diseases of anus and rectum: Secondary | ICD-10-CM

## 2017-09-24 DIAGNOSIS — K629 Disease of anus and rectum, unspecified: Secondary | ICD-10-CM

## 2017-09-24 DIAGNOSIS — N429 Disorder of prostate, unspecified: Secondary | ICD-10-CM

## 2017-09-24 DIAGNOSIS — L89152 Pressure ulcer of sacral region, stage 2: Secondary | ICD-10-CM

## 2017-09-24 LAB — CBC
HCT: 32.9 % — ABNORMAL LOW (ref 39.0–52.0)
Hemoglobin: 9.8 g/dL — ABNORMAL LOW (ref 13.0–17.0)
MCH: 24.2 pg — ABNORMAL LOW (ref 26.0–34.0)
MCHC: 29.8 g/dL — ABNORMAL LOW (ref 30.0–36.0)
MCV: 81.2 fL (ref 78.0–100.0)
Platelets: 249 K/uL (ref 150–400)
RBC: 4.05 MIL/uL — ABNORMAL LOW (ref 4.22–5.81)
RDW: 18.2 % — ABNORMAL HIGH (ref 11.5–15.5)
WBC: 4.8 K/uL (ref 4.0–10.5)

## 2017-09-24 LAB — PROTIME-INR
INR: 2.4
PROTHROMBIN TIME: 25.9 s — AB (ref 11.4–15.2)

## 2017-09-24 LAB — GLUCOSE, CAPILLARY
GLUCOSE-CAPILLARY: 83 mg/dL (ref 70–99)
GLUCOSE-CAPILLARY: 141 mg/dL — AB (ref 70–99)
Glucose-Capillary: 114 mg/dL — ABNORMAL HIGH (ref 70–99)
Glucose-Capillary: 107 mg/dL — ABNORMAL HIGH (ref 70–99)

## 2017-09-24 LAB — PSA: Prostatic Specific Antigen: 1.16 ng/mL (ref 0.00–4.00)

## 2017-09-24 MED ORDER — NYSTATIN 100000 UNIT/ML MT SUSP
5.0000 mL | Freq: Four times a day (QID) | OROMUCOSAL | 0 refills | Status: DC
Start: 2017-09-24 — End: 2017-10-12

## 2017-09-24 MED ORDER — WARFARIN SODIUM 2.5 MG PO TABS: 2.5000 mg | ORAL_TABLET | Freq: Every day | ORAL | 0 refills | Status: DC

## 2017-09-24 MED ORDER — POTASSIUM CHLORIDE ER 10 MEQ PO TBCR: 30.0000 meq | EXTENDED_RELEASE_TABLET | Freq: Two times a day (BID) | ORAL | 0 refills | Status: DC

## 2017-09-24 MED ORDER — COLLAGENASE 250 UNIT/GM EX OINT
TOPICAL_OINTMENT | Freq: Every day | CUTANEOUS | 2 refills | Status: DC
Start: 2017-09-24 — End: 2017-10-15

## 2017-09-24 MED ORDER — ASCORBIC ACID 500 MG PO TABS: 500.0000 mg | ORAL_TABLET | Freq: Two times a day (BID) | ORAL | 0 refills | Status: AC

## 2017-09-24 MED ORDER — INSULIN PEN NEEDLE 32G X 6 MM MISC: 1.0000 "application " | Freq: Every day | 0 refills | Status: AC

## 2017-09-24 MED ORDER — RESOURCE THICKENUP CLEAR PO POWD: 1.0000 g | ORAL | 0 refills | Status: DC | PRN

## 2017-09-24 MED ORDER — SPIRONOLACTONE 25 MG PO TABS: 25.0000 mg | ORAL_TABLET | Freq: Every day | ORAL | 0 refills | Status: DC

## 2017-09-24 MED ORDER — FAMOTIDINE 20 MG PO TABS: 20.0000 mg | ORAL_TABLET | Freq: Two times a day (BID) | ORAL | 0 refills | Status: DC

## 2017-09-24 MED ORDER — COLLAGENASE 250 UNIT/GM EX OINT: TOPICAL_OINTMENT | Freq: Every day | CUTANEOUS | 2 refills | Status: DC

## 2017-09-24 MED ORDER — CLONAZEPAM 0.25 MG PO TBDP: 0.2500 mg | ORAL_TABLET | Freq: Two times a day (BID) | ORAL | 0 refills | Status: DC | PRN

## 2017-09-24 MED ORDER — PREMIER PROTEIN SHAKE: 11.0000 [oz_av] | Freq: Two times a day (BID) | ORAL | 0 refills | Status: DC

## 2017-09-24 MED ORDER — MAGNESIUM OXIDE 400 (241.3 MG) MG PO TABS: 400.0000 mg | ORAL_TABLET | Freq: Two times a day (BID) | ORAL | 0 refills | Status: AC

## 2017-09-24 MED ORDER — TORSEMIDE 20 MG PO TABS: 60.0000 mg | ORAL_TABLET | Freq: Every day | ORAL | 0 refills | Status: DC

## 2017-09-24 MED ORDER — INSULIN DETEMIR 100 UNIT/ML FLEXPEN: 25.0000 [IU] | PEN_INJECTOR | Freq: Every day | SUBCUTANEOUS | 11 refills | Status: DC

## 2017-09-24 MED ORDER — DOXYCYCLINE HYCLATE 100 MG PO TABS: 100.0000 mg | ORAL_TABLET | Freq: Two times a day (BID) | ORAL | 0 refills | Status: DC

## 2017-09-24 MED ORDER — ADULT MULTIVITAMIN W/MINERALS CH: 1.0000 | ORAL_TABLET | Freq: Every day | ORAL | Status: AC

## 2017-09-24 MED ORDER — BISACODYL 5 MG PO TBEC: 5.0000 mg | DELAYED_RELEASE_TABLET | Freq: Every day | ORAL | 0 refills | Status: DC | PRN

## 2017-09-24 MED ORDER — DOXYCYCLINE HYCLATE 100 MG PO TABS
100.0000 mg | ORAL_TABLET | Freq: Two times a day (BID) | ORAL | Status: DC
  Administered 2017-09-24 – 2017-09-25 (×2): 100 mg via ORAL
  Filled 2017-09-24 (×2): qty 1

## 2017-09-24 MED ORDER — TRAZODONE HCL 50 MG PO TABS: 50.0000 mg | ORAL_TABLET | Freq: Every day | ORAL | 0 refills | Status: DC

## 2017-09-24 NOTE — Progress Notes (Signed)
[place] PHYSICAL MEDICINE & REHABILITATION     PROGRESS NOTE    Subjective/Complaints: Patient seen lying in bed this morning. He states he did not sleep well overnight. Seen the heart failure team, notes reviewed. Had long discussion with patient and family regarding sleep hygiene. Ultimately patient agrees to trial prescription medications for 1 week. Yesterday patient's sacral ulcer noted to be more tender. CT ordered for further evaluation.  ROS: denies CP, SOB, nausea, vomiting, diarrhea.  Objective:  Ct Pelvis W Contrast  Result Date: 09/23/2017 CLINICAL DATA:  Pressure sores EXAM: CT PELVIS WITH CONTRAST TECHNIQUE: Multidetector CT imaging of the pelvis was performed using the standard protocol following the bolus administration of intravenous contrast. CONTRAST:  142mL OMNIPAQUE IOHEXOL 300 MG/ML  SOLN COMPARISON:  None. FINDINGS: Urinary Tract:  Bladder is within normal limits. Bowel: Node disproportionate dilatation of bowel to suggest obstruction. There is irregular wall thickening of the lower rectum. See image 93. Vascular/Lymphatic: No abnormal adenopathy. No evidence of aortic aneurysm. 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 Atherosclerotic changes are noted. 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 Other: There is stranding within the subcutaneous fat of the gluteal region associated with skin thickening. There is no underlying abscess. Musculoskeletal: No acute fracture. No obvious destructive bone lesion. Advanced degenerative disc disease at L4-5. There is facet arthropathy at L4-5 and L5-S1. 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  Correlate with PSA and physical exam. Irregular rectal wall thickening. Mass along the wall of the rectum cannot be excluded. Correlate with physical exam and endoscopy. 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 Skin thickening and associated inflammatory changes in the  underlying fat of the gluteal regions. Lumbar degenerative disc disease. Electronically Signed   By: Marybelle Killings M.D.   On: 09/23/2017 14:57   Recent Labs    09/23/17 0437 09/24/17 0424  WBC 4.2 4.8  HGB 9.5* 9.8*  HCT 31.5* 32.9*  PLT 229 249   Recent Labs    09/23/17 0437  NA 133*  K 3.9  CL 101  GLUCOSE 123*  BUN 15  CREATININE 1.02  CALCIUM 8.8*   CBG (last 3)  Recent Labs    09/23/17 1647 09/23/17 2212 09/24/17 0656  GLUCAP 140* 134* 83    Wt Readings from Last 3 Encounters:  09/24/17 98 kg (216 lb 0.8 oz)  09/10/17 97.8 kg (215 lb 8 oz)  07/22/17 103.9 kg (229 lb)     Intake/Output Summary (Last 24 hours) at 09/24/2017 1004 Last data filed at 09/24/2017 0615 Gross per 24 hour  Intake 550 ml  Output 2050 ml  Net -1500 ml    Vital Signs: Blood pressure (!) 87/63, pulse (!) 183, temperature (!) 97 F (36.1 C), resp. rate 18, height 5\' 11"  (1.803 m), weight 98 kg (216 lb 0.8 oz), SpO2 98 %. Physical Exam:  Constitutional: No distress . Vital signs reviewed. HENT: Normocephalic.  Atraumatic. Eyes: EOMI. No discharge. Cardiovascular: RRR. No JVD. Respiratory: CTA bilaterally. Normal effort. GI: BS +. Non-distended. Musc: No edema or tenderness in extremities. Neurological: He is alert and oriented  dysphonia Motor: B/l UE 4/5 prox to distal. (unchanged) B/l LE: 4-/5 HF, 4/5 KE and 4+/5 ADF/PF. (unchanged) Skin: Skin is warm. Intact. Psychiatric: pleasant and cooperative   Assessment/Plan: 1. Functional deficits secondary to debility after CHF/LVAD/VDRF which require 2hr per day over 7 days of interdisciplinary therapy in a comprehensive inpatient rehab setting. Physiatrist is providing  close team supervision and 24 hour management of active medical problems listed below. Physiatrist and rehab team continue to assess barriers to discharge/monitor patient progress toward functional and medical goals.  Function:  Bathing Bathing position    Position: Sitting EOB  Bathing parts Body parts bathed by patient: Right arm, Left arm, Chest, Abdomen, Front perineal area, Right upper leg, Left upper leg, Right lower leg, Left lower leg Body parts bathed by helper: Buttocks  Bathing assist Assist Level: Touching or steadying assistance(Pt > 75%)      Upper Body Dressing/Undressing Upper body dressing   What is the patient wearing?: Pull over shirt/dress     Pull over shirt/dress - Perfomed by patient: Thread/unthread right sleeve, Thread/unthread left sleeve, Put head through opening, Pull shirt over trunk          Upper body assist Assist Level: Set up      Lower Body Dressing/Undressing Lower body dressing   What is the patient wearing?: Underwear, Pants, Socks, Shoes Underwear - Performed by patient: Thread/unthread right underwear leg, Thread/unthread left underwear leg, Pull underwear up/down   Pants- Performed by patient: Thread/unthread right pants leg, Pull pants up/down, Thread/unthread left pants leg Pants- Performed by helper: Thread/unthread left pants leg Non-skid slipper socks- Performed by patient: Don/doff right sock, Don/doff left sock Non-skid slipper socks- Performed by helper: Don/doff right sock, Don/doff left sock Socks - Performed by patient: Don/doff right sock, Don/doff left sock   Shoes - Performed by patient: Don/doff right shoe, Don/doff left shoe, Fasten right, Fasten left            Lower body assist Assist for lower body dressing: Touching or steadying assistance (Pt > 75%)      Toileting Toileting Toileting activity did not occur: No continent bowel/bladder event Toileting steps completed by patient: Adjust clothing prior to toileting, Performs perineal hygiene, Adjust clothing after toileting Toileting steps completed by helper: Performs perineal hygiene, Adjust clothing after toileting Toileting Assistive Devices: Grab bar or rail  Toileting assist Assist level: Touching or steadying  assistance (Pt.75%)   Transfers Chair/bed transfer Chair/bed transfer activity did not occur: Refused Chair/bed transfer method: Ambulatory Chair/bed transfer assist level: Touching or steadying assistance (Pt > 75%) Chair/bed transfer assistive device: Armrests, Medical sales representative Ambulation activity did not occur: Refused   Max distance: 150 ft Assist level: Supervision or verbal cues   Wheelchair Wheelchair activity did not occur: Refused        Cognition Comprehension Comprehension assist level: Follows basic conversation/direction with no assist  Expression Expression assist level: Expresses basic 75 - 89% of the time/requires cueing 10 - 24% of the time. Needs helper to occlude trach/needs to repeat words.  Social Interaction Social Interaction assist level: Interacts appropriately with others with medication or extra time (anti-anxiety, antidepressant).  Problem Solving Problem solving assist level: Solves basic 75 - 89% of the time/requires cueing 10 - 24% of the time  Memory Memory assist level: Recognizes or recalls 75 - 89% of the time/requires cueing 10 - 24% of the time   Medical Problem List and Plan:  1. Functional and mobility deficits secondary to debility after congestive heart failure/LVAD/VDRF and multiple other medical issues   Continue CIR, plan DC tomorrow 2. DVT Prophylaxis/Anticoagulation: Pharmaceutical: Coumadin   INR therapeutic on 7/25 3. Pain Management: tylenol prn.   -T#3 for hydrotherapy 4. Mood: LCSW to follow for evaluation and support.  5. Neuropsych: This patient is ?fully capable of making decisions  on his own behalf.  6. Deep tissue injury bilateral buttocks/Skin/Wound Care: Air mattress overlay.     -Hydrotherapy to buttocks M->Sa. 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 Will need urology  and GI follow-up as outpatient. 7. Fluids/Electrolytes/Nutrition: Strict I/Os. Continue nutritional supplement to promote wound healing  8. T2DM: Monitor BS ac/hs. Continue Lantus   Labile on 7/26 9. A fib: Off amiodarone.   10. 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 Started on [facility] 7/12. Continue Spironolactone and digoxin daily. Monitor renal status serially.  Filed Weights   09/23/17 0446 09/24/17 0435 09/24/17 0614  Weight: 96.2 kg (212 lb 1.3 oz) 96.2 kg (212 lb 1.3 oz) 98 kg (216 lb 0.8 oz)   ? Trending up on 7/26  Appreciate heart failure recs 11. [name]/VF: S/p emergent cardioversion 6/24 and 6/26-->ICD turned on 7/12. To keep K> 4.0 and Mg> 2.0   Potassium supplement increased on 7/22  Improving on 7/25   Heart rate recording unreliable 12. ABLA: Improved with transfusion.    Hemoglobin 9.8 on 7/26 13. Dysphagia: Continue dysphagia 2, honey liquids with chin tuck.   -continue to push po liquids  MBS on 7/24, continue current diet  Advance diet as tolerated 14. LVAD implant 6/17: On coumadin and low dose ASA.   -Education ongoing with patient and wife. feeling more comfortable with process  -Dressing changes per VAD coordinator or trained caregiver.  15. Hyponatremia  Sodium 133 on 7/25  Continue to monitor 16. Sleep disturbance  Trazodone 50 started on 7/23, increased to 100 on 7/24   Improving if patient takes medications.  LOS (Days) 14 A FACE TO FACE EVALUATION WAS PERFORMED  Mcguire Gasparyan Lorie Phenix, MD 09/24/2017 10:04 AM

## 2017-09-24 NOTE — Progress Notes (Signed)
Speech Language Pathology Discharge Summary  Patient Details  Name: [name] [name] MRN: [id number] Date of Birth: 1950-04-16  Today's Date: 09/24/2017 SLP Individual Time: 0800-0900 SLP Individual Time Calculation (min): 60 min   Skilled Therapeutic Interventions:  Pt was seen for family education prior to discharge.   Pt's wife was present for the duration of today's therapy session.  She reports she has been thickening pt's liquids.  SLP updated pt's wife on results of FEES and recommendations for diet upgrade.  Demonstration was provided for how to thicken liquids to the appropriate viscosity.  SLP also discussed dys 3 textures, including foods allowed and foods to avoid.  Handout was provided to maximize carryover in the home environment.  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   Pt was able to return demonstration of EMST device with mod I and SLP provided instruction on how to adjust device to increase resistance as pt becomes stronger in order to maximize treatment effects.  Wife and pt both feel that pt is near his baseline for cognitive-linguistic function.  All questions were answered to their satisfaction at this time.  Pt is ready for discharge tomorrow.     Patient has met 4 of 4 long term goals.  Patient to discharge at overall Supervision level.  Reasons goals not met:     Clinical Impression/Discharge Summary:   Pt has made functional gains while inpatient and is discharging having met 4 out of 4 short term goals.  Pt is currently consuming dys 3, nectar thick liquids diet with Mod I use of swallowing precautions.  Pt would benefit from therapeutic trials of ice chips following oral care in between meals per the water protocol at home; however, protocol was not initiated while inpatient due to recent diet advancement.   Pt is mod I for use of EMST 150 device to address voicing and cough strength and  has been educated on how to adjust device to increase resistance in order to maximize treatment effect.  Pt and family education is complete at this time.  Pt had mild confusion/cognitive deficits upon initial evaluation which SLP suspects to have been related to medication effects as they appear to have mostly resolved at this time.  Pt is discharging home with recommendations for ST follow up at next level of care to address swallowing function and voice.    Care Partner:  Caregiver Able to Provide Assistance: Yes  Type of Caregiver Assistance: Physical;Cognitive  Recommendation:  24 hour supervision/assistance;Home Health SLP;Outpatient SLP;Other (comment)(ENT follow up )  Rationale for SLP Follow Up: Maximize functional communication;Maximize swallowing safety;Reduce caregiver burden   Equipment: none recommended by SLP    Reasons for discharge: Discharged from hospital   Patient/Family Agrees with Progress Made and Goals Achieved: Yes   Function:  Eating Eating   Modified Consistency Diet: Yes Eating Assist Level: Swallowing techniques: self managed           Cognition Comprehension Comprehension assist level: Follows complex conversation/direction with extra time/assistive device  Expression   Expression assist level: Expresses complex ideas: With extra time/assistive device  Social Interaction Social Interaction assist level: Interacts appropriately with others with medication or extra time (anti-anxiety, antidepressant).  Problem Solving Problem solving assist level: Solves basic 90% of the time/requires cueing < 10% of the time  Memory Memory assist level: Recognizes or recalls 90% of the time/requires cueing < 10% of the time  [name] [name], [name] [name] 09/24/2017, 12:26 PM

## 2017-09-24 NOTE — Telephone Encounter (Signed)
Dr Silverio Decamp please review this patient and advise. Thank you.

## 2017-09-24 NOTE — Progress Notes (Signed)
Existing VAD dressing removed and site care performed using sterile technique. Drive line exit site cleaned with Chlora prep applicators x 2, allowed to dry, and gauze dressing with silver strip re-applied. Exit site uncorporated, the velour is exposed @ 1/2 inch.  Pt still c/o burning with CHG on areas under tape and at drive line exit site. Exit site with moderate amount tan/yellow, milky drainage, no redness or foul odor noted. Drive line anchor intact. Will change to daily dressing changes at this time. Mrs. Corker at bedside and made aware.   Reviewed with Dr [name]. He will place orders for antibiotics.        VAD Parameters: Speed: 5300 Flow: 4.0  PI: 4.0  Power: 3.7    Emerson Monte RN, BSN VAD Coordinator 24/7 VAD pager: [phone]

## 2017-09-24 NOTE — Progress Notes (Addendum)
Patient ID: [name] [name], male   DOB: 1950-12-27, 67 y.o.   MRN: [id number] P    Advanced Heart Failure Rounding Note  PCP-Cardiologist: No primary care provider on file.   Subjective:    Events 6/17 Underwent HM-3 implant -> Unable to close chest with RV failure and high intrathoracic pressure 6/18 Taken back to OR for evacuation of hematoma. NO RVAD needed. Left sternum open  08/19/17 Taken back to OR for sternal closure 6/22 VT => amiodarone started.  6/24 VT/VF=>urgent cardioversion. 6/25 VT  =>urgent cardioversion  6/27 Extubated 7/12 to CIR   INR 2.4.   CT earlier today - rectal mass noted.    MAPs stable.   Wants to go home. Denies SOB. Driveline pulled out slightly with mild drainage. No fevers or chills    LVAD Interrogation HM 3: Speed: 5300 Flow: 3,8 PI: 5 Power: 4.0. No alarms.   Objective:   Weight Range: 216 lb 0.8 oz (98 kg) Body mass index is 30.13 kg/m.   Vital Signs:   Temp:  [97 F (36.1 C)-98.1 F (36.7 C)] 97 F (36.1 C) (07/26 0435) Pulse Rate:  [29-183] 183 (07/26 0435) Resp:  [17-18] 18 (07/26 0435) BP: (82-101)/(63-73) 87/63 (07/26 0802) SpO2:  [95 %-98 %] 98 % (07/26 0435) Weight:  [212 lb 1.3 oz (96.2 kg)-216 lb 0.8 oz (98 kg)] 216 lb 0.8 oz (98 kg) (07/26 0614) Last BM Date: 09/22/17   MAP 80s   Weight change: Filed Weights   09/23/17 0446 09/24/17 0435 09/24/17 0614  Weight: 212 lb 1.3 oz (96.2 kg) 212 lb 1.3 oz (96.2 kg) 216 lb 0.8 oz (98 kg)   Intake/Output:   Intake/Output Summary (Last 24 hours) at 09/24/2017 0939 Last data filed at 09/24/2017 0615 Gross per 24 hour  Intake 550 ml  Output 2050 ml  Net -1500 ml   Physical Exam   Physical Exam: GENERAL: No acute distress. HEENT: normal anicteric  NECK: Supple, JVP  6-7.  2+ bilaterally, no bruits.  No lymphadenopathy or thyromegaly appreciated.   CARDIAC:  Mechanical heart sounds with LVAD hum present.  LUNGS:  Clear to auscultation bilaterally. clear ABDOMEN:   Soft, round, nontender, positive bowel sounds x4.     LVAD exit site: driveline pulled out slightly with velour showing and mild yellow drainage Dressing dry and intact.   Stabilization device present and accurately applied.  Driveline dressing is being changed daily per sterile technique. EXTREMITIES:  Warm and dry, no cyanosis, clubbing, rash or edema  NEUROLOGIC:  Alert and oriented x 4.  No aphasia.  No dysarthria.  Affect pleasant.      Labs    CBC Recent Labs    09/23/17 0437 09/24/17 0424  WBC 4.2 4.8  HGB 9.5* 9.8*  HCT 31.5* 32.9*  MCV 81.8 81.2  PLT 229 387   Basic Metabolic Panel Recent Labs    09/23/17 0437  NA 133*  K 3.9  CL 101  CO2 25  GLUCOSE 123*  BUN 15  CREATININE 1.02  CALCIUM 8.8*  MG 2.0   Liver Function Tests Recent Labs    09/23/17 0437  AST 28  ALT 20  ALKPHOS 89  BILITOT 1.0  PROT 7.7  ALBUMIN 2.5*   No results for input(s): LIPASE, AMYLASE in the last 72 hours. Cardiac Enzymes No results for input(s): CKTOTAL, CKMB, CKMBINDEX, TROPONINI in the last 72 hours.  BNP: BNP (last 3 results) Recent Labs    09/05/17 2327 09/13/17 0331 09/20/17  0349  BNP 86.1 53.3 56.0    ProBNP (last 3 results) Recent Labs    11/12/16 1559  PROBNP 388.0*     D-Dimer No results for input(s): DDIMER in the last 72 hours. Hemoglobin A1C No results for input(s): HGBA1C in the last 72 hours. Fasting Lipid Panel No results for input(s): CHOL, HDL, LDLCALC, TRIG, CHOLHDL, LDLDIRECT in the last 72 hours. Thyroid Function Tests No results for input(s): TSH, T4TOTAL, T3FREE, THYROIDAB in the last 72 hours.  Invalid input(s): FREET3  Other results:   Imaging    Ct Pelvis W Contrast  Result Date: 09/23/2017 CLINICAL DATA:  Pressure sores EXAM: CT PELVIS WITH CONTRAST TECHNIQUE: Multidetector CT imaging of the pelvis was performed using the standard protocol following the bolus administration of intravenous contrast. CONTRAST:  172mL  OMNIPAQUE IOHEXOL 300 MG/ML  SOLN COMPARISON:  None. FINDINGS: Urinary Tract:  Bladder is within normal limits. Bowel: Node disproportionate dilatation of bowel to suggest obstruction. There is irregular wall thickening of the lower rectum. See image 93. Vascular/Lymphatic: No abnormal adenopathy. No evidence of aortic aneurysm. 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 Atherosclerotic changes are noted. 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 Other: There is stranding within the subcutaneous fat of the gluteal region associated with skin thickening. There is no underlying abscess. Musculoskeletal: No acute fracture. No obvious destructive bone lesion. Advanced degenerative disc disease at L4-5. There is facet arthropathy at L4-5 and L5-S1. 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  Correlate with PSA and physical exam. Irregular rectal wall thickening. Mass along the wall of the rectum cannot be excluded. Correlate with physical exam and endoscopy. 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 Skin thickening and associated inflammatory changes in the underlying fat of the gluteal regions. Lumbar degenerative disc disease. Electronically Signed   By: Marybelle Killings M.D.   On: 09/23/2017 14:57     Medications:     Scheduled Medications: . aspirin  81 mg Oral Daily  . bisacodyl  10 mg Oral Daily   Or  . bisacodyl  10 mg Rectal Daily  . Chlorhexidine Gluconate Cloth  6 each Topical QHS  . collagenase   Topical Daily  . famotidine  20 mg Oral BID  . insulin aspart  0-15 Units Subcutaneous TID WC  . insulin aspart  0-5 Units Subcutaneous QHS  . insulin detemir  25 Units Subcutaneous QHS  . magnesium oxide  400 mg Oral BID  . mouth rinse  15 mL Mouth Rinse BID  . multivitamin with minerals  1 tablet Oral Daily  . nystatin  5 mL Oral QID  . potassium chloride  30 mEq Oral BID  . protein supplement shake  11 oz Oral BID BM  . sodium  chloride flush  10-40 mL Intracatheter Q12H  . spironolactone  25 mg Oral Daily  . torsemide  60 mg Oral Daily  . traZODone  50 mg Oral QHS  . vitamin C  500 mg Oral BID  . warfarin  2.5 mg Oral q1800  . Warfarin - Pharmacist Dosing Inpatient   Does not apply q1800    Infusions:   PRN Medications: acetaminophen, acetaminophen-codeine, alum & mag hydroxide-simeth, bisacodyl, clonazepam, diphenhydrAMINE, guaiFENesin-dextromethorphan, levalbuterol, polyethylene glycol, prochlorperazine **OR** prochlorperazine **OR** prochlorperazine, RESOURCE THICKENUP CLEAR, sodium chloride flush, sodium phosphate    Patient Profile   [name] [name] is a 67 y.o. male with a past medical history of chronic systolic CHF  due to ICM, [name], [name], [name], [name], [name], HTN, HL, [name]   Directly admitted with persistent low cardiac output for milrinone initiation for home.  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 Acute/Chronic systolic CHF with biventricular failure-> cardiogenic shock: Echo  08/13/2017 EF 20-25%. s/p  Medtronic BiV ICD in place. Cath 12/18 with stable 1v CAD.  s/p HM-3 implant 6/17 under destination therapy. Unable to close chest due to high-intrathoracic pressures and RV failure. Taken back to OR 08/17/17 with evacuation of hematoma with improvement. Extubated 6/28.  Ramp ECHO 7/11 with speed increased to 5300.  -Volume status stable. Continue torsemide 60 mg daily  - Continue spiro 25 mg daily.  2. 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 Ramp ECHO speed increased to 5300.   - VAD interrogated personally. Parameters stable.   - Continue warfarin + ASA 81, INR 2.4 today.  - No LDH today.  3. CKD II-III:  - No BMET.  4. [name]/VF: Recurrent VT on 6/22.  S/P VF 6/24 emergent bedside cardioversion. S/P VT 6/26 emergent bedside cardioversion. ICD turned back on.   - No change to current plan.   5. AFL/atrial fibrillation: S/p previous  ablation. He is currently in atrial fibrillation.  - Rate controlled. Amio stopped.  - On coumadin. No bleeding.  INR 2.4  6. [name]: Recent cath with stable CAD as above:  - No s/s of ischemia.    7. [name]: Recent A1c 8.3 on 6/3.  - Continue current regimen. No change.  8. Anemia: Post-op.  Got 1 unit PRBCs 6/22 and 6/25. - Stable.  -Hgb 9.8 .  9. Deconditioning: Severe, working with PT aggressively.   - No change to current plan.   10. Unstageable Pressure Ulcer: R and L buttock.  R buttock with necrosis noted. Continue santyl for selective debridement. Change daily. Continue air overlay. Needs to reposition every hour when in the chair. Keep R /L in bed. - WOC following. No change.   -HH will follow at d/c  R buttock looking better 50% slough 50% pale pink.  11. 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 Consider ENT f/u as outpatient.  - Remains on Dys 2/Honey diet.  12. Rectal Mass Noted on CT today. He will need GI follow up.   D/C set up for tomorrow . HH will need to follow for wound care.   I reviewed the LVAD parameters from today, and compared the results to the patient's prior recorded data.  No programming changes were made.  The LVAD is functioning within specified parameters.  The patient performs LVAD self-test daily.  LVAD interrogation was negative for any significant power changes, alarms or PI events/speed drops.  LVAD equipment check completed and is in good working order.  Back-up equipment present.   LVAD education done on emergency procedures and precautions and reviewed exit site care.  Length of Stay: [place], NP  09/24/2017, 9:39 AM  Advanced Heart Failure Team Pager [phone] (M-F; 7a - 4p)  Please contact [facility] for night-coverage after hours (4p -7a ) and weekends on [email or web address]  Overall doing well but several issues today. I reviewed CT scan abdomen and pelvis with abdominal radiologist  and both of [place] agree that he likely does NOT have a rectal mass particularly when comparing to scan from 2/19. Buttock wound viewed  and now healing in well. No abscess on CT. Driveline cord has been pulled back and has some drainage. Have reviewed with VAD coordinator. Start doxy 100 bid.   Still hoarse. Will need ENT eval to see if he would benefit from vocal cord medialization.  VAD interrogated personally. Parameters stable. INR 2.40 Discussed dosing with PharmD personally.  Possibly home in am.   Glori Bickers, MD  2:50 PM

## 2017-09-24 NOTE — Progress Notes (Signed)
Delivered home VAD equipment. Reviewed with patient and wife. Reminded them to call VAD pager if any questions tomorrow.   Discharge equipment includes:  1. Two system controllers with prese 2. One mobile power unit with 20' patient cable 3. One universal Charity fundraiser (UBC) 4. Eight fully charged batteries  5. Four battery clips 6. One travel case 7. One holster vest 8. Wearable accessory package 10. 40 dressings and 20 anchors   Zada Girt, RN VAD Coordinator  Office: [phone] 24/7 VAD Pager: [phone]

## 2017-09-24 NOTE — Progress Notes (Signed)
Physical Therapy Discharge Summary  Patient Details  Name: [name] [name] MRN: [id number] Date of Birth: December 10, 1950  Today's Date: 09/24/2017 PT Individual Time: 0900-0930 PT Individual Time Calculation (min): 30 min   Pt performs bed mobility without assistance.  Sit to stand and all transfers with supervision with RW.  Gait throughout unit in home and controlled environments with supervision with RW.  Stair negotiation with 1 handrail x 4 stairs with supervision.  simulated car transfer to [employer] with pt requiring supervision, cues to stand and lift 1 LE in vehicle first to decrease shear on bottom from pivoting into car. Pt's wife understands need for close supervision during car transfer and distant supervision for mobility.  Pt and wife state they feel ready for d/c home tomorrow. Pt left in room with needs at hand, wife present.  Patient has met 8 of 8 long term goals due to improved activity tolerance, improved balance, increased strength, decreased pain and ability to compensate for deficits.  Patient to discharge at an ambulatory level Supervision.   Patient's care partner is independent to provide the necessary supervision assistance at discharge.  Reasons goals not met: n/a  Recommendation:  Patient will benefit from ongoing skilled PT services in home health setting to continue to advance safe functional mobility, address ongoing impairments in strength, activity tolerance, balance, and minimize fall risk.  Equipment: w/c, roho cushion, hospital bed with air mattress  Reasons for discharge: treatment goals met and discharge from hospital  Patient/family agrees with progress made and goals achieved: Yes  PT Discharge Precautions/Restrictions Precautions Precautions: Sternal;Fall Precaution Comments: LVAD, sacral wound Restrictions Weight Bearing Restrictions: No Pain Pain Assessment Faces Pain Scale: Hurts little more Pain Type: Acute pain Pain Location:  Buttocks Pain Descriptors / Indicators: Aching Pain Onset: On-going Pain Intervention(s): Repositioned  Cognition Overall Cognitive Status: Within Functional Limits for tasks assessed Arousal/Alertness: Awake/alert Sensation Sensation Light Touch: Appears Intact Proprioception: Appears Intact Coordination Gross Motor Movements are Fluid and Coordinated: Yes Fine Motor Movements are Fluid and Coordinated: Yes Motor  Motor Motor - Discharge Observations: generalized weakness  Mobility Bed Mobility Supine to Sit: Independent Sit to Supine: Independent Transfers Transfers: Programmer, applications Transfers: Supervision/Verbal cueing Transfer (Assistive device): Rolling walker Locomotion  Gait Ambulation: Yes Gait Assistance: Supervision/Verbal cueing Gait Distance (Feet): 200 Feet Assistive device: Rolling walker Stairs / Additional Locomotion Stairs: Yes Stairs Assistance: Supervision/Verbal cueing Stair Management Technique: One rail Right Number of Stairs: 4 Wheelchair Mobility Wheelchair Mobility: No  Trunk/Postural Assessment  Cervical Assessment Cervical Assessment: Within Functional Limits Thoracic Assessment Thoracic Assessment: (rounded shoulders) Lumbar Assessment Lumbar Assessment: (posterior pelvic tilt) Postural Control Postural Control: Within Functional Limits  Balance Dynamic Standing Balance Dynamic Standing - Level of Assistance: 5: Stand by assistance Extremity Assessment      RLE Assessment General Strength Comments: grossly 4/5 LLE Assessment General Strength Comments: grossly 4/5   See Function Navigator for Current Functional Status.  [name] 09/24/2017, 9:54 AM

## 2017-09-24 NOTE — Progress Notes (Signed)
Physical Therapy Wound Treatment Patient Details  Name: [name] [name] MRN: [id number] Date of Birth: 03/25/1950  Today's Date: 09/24/2017 PT Individual Time:  -      Subjective  Subjective: Pt wife very concern about how she will take care of dressing changes Patient and Family Stated Goals: Heal wound Prior Treatments: Wet to dry dressing changes with Santyl  Pain Score: Pt with 7/10 pain after hydrotherapy despite premedication.    Wound Assessment  Pressure Injury 08/27/17 Unstageable - Full thickness tissue loss in which the base of the ulcer is covered by slough (yellow, tan, gray, green or brown) and/or eschar (tan, brown or black) in the wound bed. 8.5 x 6 (90% darkened 5% yellow and 5% pink (Active)  Wound Image   09/20/2017 11:28 AM  Dressing Type Gauze (Comment);Barrier Film (skin prep);Moist to dry 09/24/2017  2:00 PM  Dressing Clean;Dry;Intact;Changed 09/24/2017  2:00 PM  Dressing Change Frequency Daily 09/24/2017  2:00 PM  State of Healing Early/partial granulation 09/24/2017  2:00 PM  Site / Wound Assessment Pink;Yellow 09/24/2017  2:00 PM  % Wound base Red or Granulating 45% 09/24/2017  2:00 PM  % Wound base Yellow/Fibrinous Exudate 55% 09/24/2017  2:00 PM  % Wound base Black/Eschar 0% 09/24/2017  2:00 PM  % Wound base Other/Granulation Tissue (Comment) 0% 09/24/2017  2:00 PM  Peri-wound Assessment Intact;Other (Comment) 09/24/2017  2:00 PM  Wound Length (cm) 9.1 cm 09/20/2017 11:28 AM  Wound Width (cm) 7.5 cm 09/20/2017 11:28 AM  Wound Depth (cm) 0.2 cm 09/20/2017 11:28 AM  Wound Surface Area (cm^2) 68.25 cm^2 09/20/2017 11:28 AM  Wound Volume (cm^3) 13.65 cm^3 09/20/2017 11:28 AM  Tunneling (cm) 0 09/10/2017 10:51 AM  Undermining (cm) 0 09/10/2017 10:51 AM  Margins Unattached edges (unapproximated) 09/24/2017  2:00 PM  Drainage Amount Moderate 09/24/2017  2:00 PM  Drainage Description Purulent;No odor 09/24/2017  2:00 PM  Treatment Debridement (Selective);Hydrotherapy (Pulse  lavage);Packing (Saline gauze) 09/24/2017  2:00 PM  Santyl applied to necrotic tissue    Hydrotherapy Pulsed lavage therapy - wound location: R buttock Pulsed Lavage with Suction (psi): 8 psi(8-12) Pulsed Lavage with Suction - Normal Saline Used: 1000 mL Pulsed Lavage Tip: Tip with splash shield Selective Debridement Selective Debridement - Location: R buttock Selective Debridement - Tools Used: Other (comment) Selective Debridement - Tissue Removed: (too tightly adhered to remove)   Wound Assessment and Plan  Wound Therapy - Assess/Plan/Recommendations Wound Therapy - Clinical Statement: CT scan revealed signs of skin thickening but no abcesses present. PT educated wife in need to keep close watch on changes to the periwound and inform [place] of new edema, erethema, or change in skin temperature. Wife also educated on proper dressing changes and need for offloading wound. Last hydrotherapy is scheduled for tomorrow if pt does not d/c before hand.  Wound Therapy - Functional Problem List: Decreased tolerance for OOB and position changes Factors Delaying/Impairing Wound Healing: Diabetes Mellitus;Immobility;Multiple medical problems Hydrotherapy Plan: Dressing change;Patient/family education;Pulsatile lavage with suction Wound Therapy - Frequency: 6X / week Wound Therapy - Follow Up Recommendations: [place] Wound Plan: See above  Wound Therapy Goals- Improve the function of patient's integumentary system by progressing the wound(s) through the phases of wound healing (inflammation - proliferation - remodeling) by: Decrease Necrotic Tissue to: 40 Decrease Necrotic Tissue - Progress: Progressing toward goal Increase Granulation Tissue to: 60 Increase Granulation Tissue - Progress: Progressing toward goal  Goals will be updated until maximal potential achieved or discharge criteria met.  Discharge criteria: when goals achieved, discharge from hospital, MD decision/surgical  intervention, no progress towards goals, refusal/missing three consecutive treatments without notification or medical reason.  GP    Dani Gobble. Migdalia Dk PT, DPT Acute Rehabilitation  [phone] Pager [phone]   [place] 09/24/2017, 3:07 PM

## 2017-09-24 NOTE — Progress Notes (Addendum)
[place] for coumadin Indication: LVAD  Allergies  Allergen Reactions  . Penicillins Hives, Itching and Other (See Comments)    Has patient had a PCN reaction causing immediate rash, facial/tongue/throat swelling, SOB or lightheadedness with hypotension:# # Yes # # Has patient had a PCN reaction causing severe rash involving mucus membranes or skin necrosis: No Has patient had a PCN reaction that required hospitalization: No Has patient had a PCN reaction occurring within the last 10 years: No If all of the above answers are "NO", then may proceed with Cephalosporin use.     Patient Measurements: Height: 5\' 11"  (180.3 cm) Weight: 216 lb 0.8 oz (98 kg) IBW/kg (Calculated) : 75.3 Heparin Dosing Weight: 98.3 kg  Vital Signs: Temp: 97 F (36.1 C) (07/26 0435) BP: 87/63 (07/26 0802) Pulse Rate: 183 (07/26 0435)  Labs: Recent Labs    09/22/17 0348 09/23/17 0437 09/24/17 0424  HGB  --  9.5* 9.8*  HCT  --  31.5* 32.9*  PLT  --  229 249  LABPROT 25.1* 25.8* 25.9*  INR 2.30 2.38 2.40  CREATININE  --  1.02  --     Estimated Creatinine Clearance: 83.9 mL/min (by C-G formula based on SCr of 1.02 mg/dL).  Assessment: 15 yoM s/p LVAD on 6/17 on warfarin per Rx. Patient previously therapeutic on 3 mg dosing.   INR stable on 2.5mg  daily. INR 2.4. No bleeding issues noted. Eating 100% meal intake.  Goal of Therapy:  INR goal 2-2.5 Monitor platelets by anticoagulation protocol: Yes   Plan:   - Warfarin 2.5 mg daily - Plan for this as home dose - Daily INR, CBC, LDH  Doylene Canard, PharmD Clinical Pharmacist  Pager: [phone] Phone: [phone] 09/24/2017 9:42 AM

## 2017-09-24 NOTE — Telephone Encounter (Signed)
He will need colonoscopy for evaluation . Given his co-morbidities needs to have procedure done at hospital . Please check with Dr Fuller Plan regarding his availability as he was supervisor when patient saw Nicoletta Ba.

## 2017-09-24 NOTE — Progress Notes (Signed)
Occupational Therapy Discharge Summary  Patient Details  Name: [name] [name] MRN: [id number] Date of Birth: January 26, 1951  Today's Date: 09/24/2017 OT Individual Time: 1300-1400 OT Individual Time Calculation (min): 60 min   1:1 Grad day! Pt  Completed bathing with wife this morning and don minimal clothing this pm but could demonstrate ability to do all tasks for tomorrow (d/c day). Pt able to demonstrate bed mobility mod I and able to switch his LVAD power from wall to batteries without needed cues. When pt stood reports not feeling well and light headed. Pt sat back down and BP taken with dynamap and map with doppler- [facility] for pt.  Allowed rest break and then pt able to ambulate at a slow pace from room to gym with RW. Once in gym able to complete 6 bounce passes with re bounder with sit to stands before quested to stop due to fatigue.  Reported that the pain meds he took eariler make him feel "werid"  Self propelled back to his room and supervision transfer back into bed. Reiterated safety recommendations for d/c.   Patient has met 10 of 10 long term goals due to improved activity tolerance, improved balance, postural control, ability to compensate for deficits and improved coordination.  Patient to discharge at overall Supervision level.  Patient's care partner is independent to provide the necessary physical and cognitive assistance at discharge.    Reasons goals not met: n/a  Recommendation:  Patient will benefit from ongoing skilled OT services in home health setting to continue to advance functional skills in the area of BADL and Reduce care partner burden.  Equipment: hospital bed with air over lay; 3:1, and RW  Reasons for discharge: treatment goals met and discharge from hospital  Patient/family agrees with progress made and goals achieved: Yes  OT Discharge Precautions/Restrictions  Precautions Precautions: Sternal;Fall Precaution Comments: LVAD, sacral  wound Restrictions Weight Bearing Restrictions: No General   Vital Signs Therapy Vitals Temp: 97.8 F (36.6 C) Temp Source: Oral Pulse Rate: 92 Resp: 18 BP: (!) 76/66 Patient Position (if appropriate): Lying Oxygen Therapy SpO2: 90 % O2 Device: Room Air Pain Pain Assessment Pain Scale: 0-10 Pain Score: 7  Pain Type: Acute pain Pain Location: Buttocks Pain Descriptors / Indicators: Aching Pain Intervention(s): Repositioned ADL ADL ADL Comments: see functional navigator Vision Baseline Vision/History: Wears glasses Wears Glasses: Reading only Patient Visual Report: No change from baseline Vision Assessment?: No apparent visual deficits Perception  Perception: Within Functional Limits Praxis Praxis: Intact Cognition Overall Cognitive Status: Within Functional Limits for tasks assessed Arousal/Alertness: Awake/alert Orientation Level: Oriented to person;Oriented to place;Oriented to situation Attention: Sustained;Selective Sustained Attention: Appears intact Selective Attention: Appears intact Memory: Impaired Memory Impairment: Decreased recall of new information Awareness: Appears intact Problem Solving: Appears intact Sensation Sensation Light Touch: Appears Intact Proprioception: Appears Intact Coordination Gross Motor Movements are Fluid and Coordinated: Yes Fine Motor Movements are Fluid and Coordinated: Yes Motor  Motor Motor - Discharge Observations: generalized weakness however improved Mobility  Bed Mobility Sit to Supine: Independent  Trunk/Postural Assessment  Cervical Assessment Cervical Assessment: Within Functional Limits Thoracic Assessment Thoracic Assessment: (rounded shoulders) Postural Control Postural Control: Within Functional Limits  Balance Static Sitting Balance Static Sitting - Balance Support: No upper extremity supported;Feet supported Static Sitting - Level of Assistance: 6: Modified independent (Device/Increase  time) Dynamic Sitting Balance Dynamic Sitting - Balance Support: No upper extremity supported;Feet supported Dynamic Sitting - Level of Assistance: 6: Modified independent (Device/Increase time) Dynamic Standing Balance Dynamic Standing -  Level of Assistance: 5: Stand by assistance Extremity/Trunk Assessment RUE Assessment RUE Assessment: Within Functional Limits LUE Assessment LUE Assessment: Within Functional Limits   See Function Navigator for Current Functional Status.  Willeen Cass [facility] 09/24/2017, 3:01 PM

## 2017-09-25 ENCOUNTER — Ambulatory Visit (HOSPITAL_COMMUNITY): Payer: PPO

## 2017-09-25 DIAGNOSIS — N183 Chronic kidney disease, stage 3 (moderate): Secondary | ICD-10-CM

## 2017-09-25 LAB — PROTIME-INR
INR: 2.61
PROTHROMBIN TIME: 27.7 s — AB (ref 11.4–15.2)

## 2017-09-25 LAB — GLUCOSE, CAPILLARY: Glucose-Capillary: 94 mg/dL (ref 70–99)

## 2017-09-25 NOTE — Progress Notes (Addendum)
[place] PHYSICAL MEDICINE & REHABILITATION     PROGRESS NOTE    Subjective/Complaints: Patient seen lying in bed this morning.He states he slept well overnight. He is ready for discharge. He was seen yesterday by GI, who recommended colonoscopy. Seen by heart failure team, notes reviewed.  ROS: denies CP, SOB, nausea, vomiting, diarrhea.  Objective:  Ct Pelvis W Contrast  Result Date: 09/23/2017 CLINICAL DATA:  Pressure sores EXAM: CT PELVIS WITH CONTRAST TECHNIQUE: Multidetector CT imaging of the pelvis was performed using the standard protocol following the bolus administration of intravenous contrast. CONTRAST:  163mL OMNIPAQUE IOHEXOL 300 MG/ML  SOLN COMPARISON:  None. FINDINGS: Urinary Tract:  Bladder is within normal limits. Bowel: Node disproportionate dilatation of bowel to suggest obstruction. There is irregular wall thickening of the lower rectum. See image 93. Vascular/Lymphatic: No abnormal adenopathy. No evidence of aortic aneurysm. 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 Atherosclerotic changes are noted. 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 Other: There is stranding within the subcutaneous fat of the gluteal region associated with skin thickening. There is no underlying abscess. Musculoskeletal: No acute fracture. No obvious destructive bone lesion. Advanced degenerative disc disease at L4-5. There is facet arthropathy at L4-5 and L5-S1. 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  Correlate with PSA and physical exam. Irregular rectal wall thickening. Mass along the wall of the rectum cannot be excluded. Correlate with physical exam and endoscopy. 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 Skin thickening and associated inflammatory changes in the underlying fat of the gluteal regions. Lumbar degenerative disc disease. Electronically Signed   By: Marybelle Killings M.D.   On: 09/23/2017 14:57   Recent Labs   09/23/17 0437 09/24/17 0424  WBC 4.2 4.8  HGB 9.5* 9.8*  HCT 31.5* 32.9*  PLT 229 249   Recent Labs    09/23/17 0437  NA 133*  K 3.9  CL 101  GLUCOSE 123*  BUN 15  CREATININE 1.02  CALCIUM 8.8*   CBG (last 3)  Recent Labs    09/24/17 1626 09/24/17 2200 09/25/17 0643  GLUCAP 141* 107* 94    Wt Readings from Last 3 Encounters:  09/25/17 97 kg (213 lb 13.5 oz)  09/10/17 97.8 kg (215 lb 8 oz)  07/22/17 103.9 kg (229 lb)     Intake/Output Summary (Last 24 hours) at 09/25/2017 0943 Last data filed at 09/25/2017 0859 Gross per 24 hour  Intake 960 ml  Output 2150 ml  Net -1190 ml    Vital Signs: Blood pressure (!) 82/64, pulse 73, temperature 98.2 F (36.8 C), temperature source Oral, resp. rate 18, height 5\' 11"  (1.803 m), weight 97 kg (213 lb 13.5 oz), SpO2 97 %. Physical Exam:  Constitutional: No distress . Vital signs reviewed. HENT: Normocephalic.  Atraumatic. Eyes: EOMI. No discharge. Cardiovascular: RRR. No JVD. Respiratory: CTA bilaterally. Normal effort. GI: BS +. Non-distended. Musc: No edema or tenderness in extremities. Neurological: He is alert and oriented  dysphonia Motor: B/l UE 4/5 prox to distal. (stable) B/l LE: 4-/5 HF, 4/5 KE and 4+/5 ADF/PF. (stable) Skin: Skin is warm. Intact. Psychiatric: pleasant and cooperative   Assessment/Plan: 1. Functional deficits secondary to debility after CHF/LVAD/VDRF which require 2hr per day over 7 days of interdisciplinary therapy in a comprehensive inpatient rehab setting. Physiatrist is providing close team supervision and 24 hour management of active medical problems listed below. Physiatrist and rehab team continue to assess barriers to discharge/monitor patient  progress toward functional and medical goals.  Function:  Bathing Bathing position   Position: Sitting EOB  Bathing parts Body parts bathed by patient: Right arm, Left arm, Chest, Abdomen, Front perineal area, Right upper leg, Left upper  leg, Right lower leg, Left lower leg Body parts bathed by helper: Buttocks  Bathing assist Assist Level: Supervision or verbal cues, Set up   Set up : To obtain items  Upper Body Dressing/Undressing Upper body dressing   What is the patient wearing?: Pull over shirt/dress     Pull over shirt/dress - Perfomed by patient: Thread/unthread right sleeve, Thread/unthread left sleeve, Put head through opening, Pull shirt over trunk          Upper body assist Assist Level: Set up   Set up : To obtain clothing/put away  Lower Body Dressing/Undressing Lower body dressing   What is the patient wearing?: Underwear, Non-skid slipper socks Underwear - Performed by patient: Thread/unthread right underwear leg, Thread/unthread left underwear leg, Pull underwear up/down   Pants- Performed by patient: Thread/unthread right pants leg, Pull pants up/down, Thread/unthread left pants leg Pants- Performed by helper: Thread/unthread left pants leg Non-skid slipper socks- Performed by patient: Don/doff right sock, Don/doff left sock Non-skid slipper socks- Performed by helper: Don/doff right sock, Don/doff left sock Socks - Performed by patient: Don/doff right sock, Don/doff left sock   Shoes - Performed by patient: Don/doff right shoe, Don/doff left shoe, Fasten right, Fasten left            Lower body assist Assist for lower body dressing: Set up   Set up : To obtain clothing/put away  Toileting Toileting Toileting activity did not occur: No continent bowel/bladder event Toileting steps completed by patient: Adjust clothing prior to toileting, Performs perineal hygiene, Adjust clothing after toileting Toileting steps completed by helper: Performs perineal hygiene, Adjust clothing after toileting Toileting Assistive Devices: Grab bar or rail  Toileting assist Assist level: Set up/obtain supplies   Transfers Chair/bed transfer Chair/bed transfer activity did not occur: Refused Chair/bed  transfer method: Ambulatory Chair/bed transfer assist level: Supervision or verbal cues Chair/bed transfer assistive device: Armrests, Medical sales representative Ambulation activity did not occur: Refused   Max distance: 150 ft Assist level: Supervision or verbal cues   Wheelchair Wheelchair activity did not occur: Refused        Cognition Comprehension Comprehension assist level: Follows complex conversation/direction with extra time/assistive device  Expression Expression assist level: Expresses complex ideas: With extra time/assistive device  Social Interaction Social Interaction assist level: Interacts appropriately with others with medication or extra time (anti-anxiety, antidepressant).  Problem Solving Problem solving assist level: Solves basic 90% of the time/requires cueing < 10% of the time  Memory Memory assist level: Recognizes or recalls 90% of the time/requires cueing < 10% of the time   Medical Problem List and Plan:  1. Functional and mobility deficits secondary to debility after congestive heart failure/LVAD/VDRF and multiple other medical issues   DC today.  Patient follow-up with M.D. For transitional care management in 1-2 weeks 2. DVT Prophylaxis/Anticoagulation: Pharmaceutical: Coumadin   INR therapeutic on 7/27 3. Pain Management: tylenol prn.   -T#3 for hydrotherapy 4. Mood: LCSW to follow for evaluation and support.  5. Neuropsych: This patient is ?fully capable of making decisions on his own behalf.  6. Deep tissue injury bilateral buttocks/Skin/Wound Care: Air mattress overlay.     -Hydrotherapy to buttocks M->Sa. 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   Seen by GI leg yesterday, recommending colonoscopy-patient will need to follow up with Dr. Fuller Plan  Will need urology and GI follow-up as outpatient. 7.  Fluids/Electrolytes/Nutrition: Strict I/Os. Continue nutritional supplement to promote wound healing  8. T2DM: Monitor BS ac/hs. Continue Lantus   Controlled on 7/27 9. A fib: Off amiodarone.   10. 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 Started on [facility] 7/12. Continue Spironolactone and digoxin daily. Monitor renal status serially.  Filed Weights   09/24/17 0435 09/24/17 0614 09/25/17 0406  Weight: 96.2 kg (212 lb 1.3 oz) 98 kg (216 lb 0.8 oz) 97 kg (213 lb 13.5 oz)   Relatively stable and 7/27  Appreciate heart failure recs 11. [name]/VF: S/p emergent cardioversion 6/24 and 6/26-->ICD turned on 7/12. To keep K> 4.0 and Mg> 2.0   Potassium supplement increased on 7/22  Improving on 7/25   Heart rate recording unreliable 12. ABLA: Improved with transfusion.    Hemoglobin 9.8 on 7/26 13. Dysphagia: Continue dysphagia 2, honey liquids with chin tuck.   -continue to push po liquids  MBS on 7/24, continue current diet  Advance diet as tolerated 14. LVAD implant 6/17: On coumadin and low dose ASA.   -Education ongoing with patient and wife. feeling more comfortable with process  -Dressing changes per VAD coordinator or trained caregiver.  15. Hyponatremia  Sodium 133 on 7/25  Continue to monitor 16. Sleep disturbance  Trazodone 50 started on 7/23, increased to 100 on 7/24   Improving if patient takes medications.  Greater than 30 minutes spent in counseling and coordination of care regarding heart failure, urology, GI - GI to all over with patient next week or hospital colonoscopy versus flexible sigmoidoscopy  LOS (Days) 15 A FACE TO FACE EVALUATION WAS PERFORMED  Flo Berroa Lorie Phenix, MD 09/25/2017 9:43 AM

## 2017-09-25 NOTE — Progress Notes (Signed)
Patient being d/c home with wife and instructions. PICC line removed yesterday per MD. Patient receiving Hydrotherapy before he is discharged. Morning med given and patient has no c/o pain at this time. Belongings gathered up by wife and Nurse. Supplies given to wife for dressing changes while at home. Wife getting patient dressing and patient being taking down by staff.

## 2017-09-25 NOTE — Progress Notes (Signed)
Patient ID: [name] [name], male   DOB: [birth year], 67 y.o.   MRN: [id number] P    Advanced Heart Failure Rounding Note  PCP-Cardiologist: No primary care provider on file.   Subjective:    Events 6/17 Underwent HM-3 implant -> Unable to close chest with RV failure and high intrathoracic pressure 6/18 Taken back to OR for evacuation of hematoma. NO RVAD needed. Left sternum open  08/19/17 Taken back to OR for sternal closure 6/22 VT => amiodarone started.  6/24 VT/VF=>urgent cardioversion. 6/25 VT  =>urgent cardioversion  6/27 Extubated 7/12 to CIR   Doing well. Eager to go home. Buttock pain improving. On doxy for driveline dislodgement   LVAD Interrogation HM 3: Speed: 5300 Flow: 4.0 PI: 5.1 Power: 4.0. No alarms.   Objective:   Weight Range: 97 kg (213 lb 13.5 oz) Body mass index is 29.83 kg/m.   Vital Signs:   Temp:  [97.8 F (36.6 C)-99.4 F (37.4 C)] 98.2 F (36.8 C) (07/27 0406) Pulse Rate:  [73-126] 73 (07/27 0406) Resp:  [18-20] 18 (07/27 0406) BP: (76-115)/(64-103) 82/64 (07/27 0406) SpO2:  [90 %-99 %] 97 % (07/27 0406) Weight:  [97 kg (213 lb 13.5 oz)] 97 kg (213 lb 13.5 oz) (07/27 0406) Last BM Date: 09/24/17   MAP 80s   Weight change: Filed Weights   09/24/17 0435 09/24/17 0614 09/25/17 0406  Weight: 96.2 kg (212 lb 1.3 oz) 98 kg (216 lb 0.8 oz) 97 kg (213 lb 13.5 oz)   Intake/Output:   Intake/Output Summary (Last 24 hours) at 09/25/2017 0816 Last data filed at 09/25/2017 0630 Gross per 24 hour  Intake 840 ml  Output 2450 ml  Net -1610 ml   Physical Exam   Physical Exam: General:  NAD. Hoarse  HEENT: normal  Neck: supple. JVP not elevated.  Carotids 2+ bilat; no bruits. No lymphadenopathy or thryomegaly appreciated. Cor: LVAD hum.  Lungs: Clear. Abdomen: obese soft, nontender, non-distended. No hepatosplenomegaly. No bruits or masses. Good bowel sounds. Driveline site clean. Anchor in place.  Extremities: no cyanosis, clubbing, rash. Warm  no edema  Dressing on right buttock  Neuro: alert & oriented x 3. No focal deficits. Moves all 4 without problem     Labs    CBC Recent Labs    09/23/17 0437 09/24/17 0424  WBC 4.2 4.8  HGB 9.5* 9.8*  HCT 31.5* 32.9*  MCV 81.8 81.2  PLT 229 629   Basic Metabolic Panel Recent Labs    09/23/17 0437  NA 133*  K 3.9  CL 101  CO2 25  GLUCOSE 123*  BUN 15  CREATININE 1.02  CALCIUM 8.8*  MG 2.0   Liver Function Tests Recent Labs    09/23/17 0437  AST 28  ALT 20  ALKPHOS 89  BILITOT 1.0  PROT 7.7  ALBUMIN 2.5*   No results for input(s): LIPASE, AMYLASE in the last 72 hours. Cardiac Enzymes No results for input(s): CKTOTAL, CKMB, CKMBINDEX, TROPONINI in the last 72 hours.  BNP: BNP (last 3 results) Recent Labs    09/05/17 2327 09/13/17 0331 09/20/17 0349  BNP 86.1 53.3 56.0    ProBNP (last 3 results) Recent Labs    11/12/16 1559  PROBNP 388.0*     D-Dimer No results for input(s): DDIMER in the last 72 hours. Hemoglobin A1C No results for input(s): HGBA1C in the last 72 hours. Fasting Lipid Panel No results for input(s): CHOL, HDL, LDLCALC, TRIG, CHOLHDL, LDLDIRECT in the last 72 hours. Thyroid  Function Tests No results for input(s): TSH, T4TOTAL, T3FREE, THYROIDAB in the last 72 hours.  Invalid input(s): FREET3  Other results:   Imaging    No results found.   Medications:     Scheduled Medications: . aspirin  81 mg Oral Daily  . bisacodyl  10 mg Oral Daily   Or  . bisacodyl  10 mg Rectal Daily  . Chlorhexidine Gluconate Cloth  6 each Topical QHS  . collagenase   Topical Daily  . doxycycline  100 mg Oral Q12H  . famotidine  20 mg Oral BID  . insulin aspart  0-15 Units Subcutaneous TID WC  . insulin aspart  0-5 Units Subcutaneous QHS  . insulin detemir  25 Units Subcutaneous QHS  . magnesium oxide  400 mg Oral BID  . mouth rinse  15 mL Mouth Rinse BID  . multivitamin with minerals  1 tablet Oral Daily  . nystatin  5 mL Oral  QID  . potassium chloride  30 mEq Oral BID  . protein supplement shake  11 oz Oral BID BM  . sodium chloride flush  10-40 mL Intracatheter Q12H  . spironolactone  25 mg Oral Daily  . torsemide  60 mg Oral Daily  . traZODone  50 mg Oral QHS  . vitamin C  500 mg Oral BID  . warfarin  2.5 mg Oral q1800  . Warfarin - Pharmacist Dosing Inpatient   Does not apply q1800    Infusions:   PRN Medications: acetaminophen, acetaminophen-codeine, alum & mag hydroxide-simeth, bisacodyl, clonazepam, diphenhydrAMINE, guaiFENesin-dextromethorphan, levalbuterol, polyethylene glycol, prochlorperazine **OR** prochlorperazine **OR** prochlorperazine, RESOURCE THICKENUP CLEAR, sodium chloride flush, sodium phosphate    Patient Profile   [name] [name] is a 67 y.o. 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   Directly admitted with persistent low cardiac output for milrinone initiation for home.  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 Acute/Chronic systolic CHF with biventricular failure-> cardiogenic shock: Echo  08/13/2017 EF 20-25%. s/p  Medtronic BiV ICD in place. Cath 12/18 with stable 1v CAD.  s/p HM-3 implant 6/17 under destination therapy. Unable to close chest due to high-intrathoracic pressures and RV failure. Taken back to OR 08/17/17 with evacuation of hematoma with improvement. Extubated 6/28.  Ramp ECHO 7/11 with speed increased to 5300.  -Volume status stable. Can decrease torsemide to 40 mg daily  - Continue spiro 25 mg daily.  2. 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 Ramp ECHO speed increased to 5300.   - VAD interrogated personally. Parameters stable. - Continue warfarin + ASA 81, INR 2.6 today.  - No LDH today.  3. CKD II-III:  - No BMET.  4. [name]/VF: Recurrent VT on 6/22.  S/P VF 6/24 emergent bedside cardioversion. S/P VT 6/26 emergent bedside  cardioversion. ICD turned back on.   - No change to current plan.   5. AFL/atrial fibrillation: S/p previous ablation. He is currently in atrial fibrillation.  - Rate controlled. Amio stopped.  - On coumadin. No bleeding.  INR 2.6 6. [name]: Recent cath with stable CAD as above:  - No s/s of ischemia.    7. [name]: Recent A1c 8.3 on 6/3.  - Continue current regimen. No change.  8. Anemia: Post-op.  Got 1 unit PRBCs 6/22 and 6/25. - Stable.  -  Hgb 9.8 .  9. Deconditioning: Severe, working with PT aggressively.   - No change to current plan.   10. Unstageable Pressure Ulcer: R and L buttock.  R buttock with necrosis noted. Continue santyl for selective debridement. Change daily. Continue air overlay. Needs to reposition every hour when in the chair. Keep R /L in bed. - WOC following. No change.   -HH will follow at d/c  R buttock looking better 50% slough 50% pale pink.  11. 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 Consider ENT f/u as outpatient ? Vocal cord medialization   - Remains on Dys 2/Honey diet.  12. Rectal Mass - CT reviewed with Radiology. 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 Will follow in VAD CLinic. HH will need to follow for wound care. I reviewed meds with him and 4w staff  I reviewed the LVAD parameters from today, and compared the results to the patient's prior recorded data.  No programming changes were made.  The LVAD is functioning within specified parameters.  The patient performs LVAD self-test daily.  LVAD interrogation was negative for any significant power changes, alarms or PI events/speed drops.  LVAD equipment check completed and is in good working order.  Back-up equipment present.   LVAD education done on emergency procedures and precautions and reviewed exit site care.   Heart failure team will sign off as of 09/25/17  Meds reviewed with patient, his  wife and 4w team  F/u arranged as above.   Length of Stay: [place], [place]  09/25/2017, 8:16 AM  Advanced Heart Failure Team Pager [phone] (M-F; 7a - 4p)  Please contact [facility] for night-coverage after hours (4p -7a ) and weekends on [email or web address]

## 2017-09-25 NOTE — Progress Notes (Signed)
[place] for coumadin Indication: LVAD  Allergies  Allergen Reactions  . Penicillins Hives, Itching and Other (See Comments)    Has patient had a PCN reaction causing immediate rash, facial/tongue/throat swelling, SOB or lightheadedness with hypotension:# # Yes # # Has patient had a PCN reaction causing severe rash involving mucus membranes or skin necrosis: No Has patient had a PCN reaction that required hospitalization: No Has patient had a PCN reaction occurring within the last 10 years: No If all of the above answers are "NO", then may proceed with Cephalosporin use.     Patient Measurements: Height: 5\' 11"  (180.3 cm) Weight: 213 lb 13.5 oz (97 kg) IBW/kg (Calculated) : 75.3 Heparin Dosing Weight: 98.3 kg  Vital Signs: Temp: 98.2 F (36.8 C) (07/27 0406) Temp Source: Oral (07/27 0406) BP: 82/64 (07/27 0406) Pulse Rate: 73 (07/27 0406)  Labs: Recent Labs    09/23/17 0437 09/24/17 0424 09/25/17 0502  HGB 9.5* 9.8*  --   HCT 31.5* 32.9*  --   PLT 229 249  --   LABPROT 25.8* 25.9* 27.7*  INR 2.38 2.40 2.61  CREATININE 1.02  --   --     Estimated Creatinine Clearance: 83.5 mL/min (by C-G formula based on SCr of 1.02 mg/dL).  Assessment: 28 yoM s/p LVAD on 6/17 on warfarin per Rx. Patient previously therapeutic on 3 mg dosing now requiring slightly less..   INR stable on 2.5mg  daily. INR 2.6. No bleeding issues noted. Eating 100% meal intake.  Adding doxycycline for 7days may increase INR - will f/u as outpatient.  Goal of Therapy:  INR goal 2-2.5 Monitor platelets by anticoagulation protocol: Yes   Plan:   - Warfarin 2.5 mg daily - Plan for this as home dose - HHRN to check INR 7/30 Tue and call to HF clinic   Bonnita Nasuti Pharm.D. CPP, BCPS Clinical Pharmacist [phone] 09/25/2017 10:59 AM

## 2017-09-25 NOTE — Plan of Care (Signed)
Patient being discharged home.

## 2017-09-25 NOTE — Progress Notes (Signed)
Physical Therapy Wound Treatment Patient Details  Name: [name] [name] MRN: [id number] Date of Birth: Sep 12, 1950  Today's Date: 09/25/2017 PT Individual Time: 8921-1941 PT Individual Time Calculation (min): 40 min   Patient Active Problem List   Diagnosis Date Noted  . Decubitus ulcer of sacral region, stage 2 09/24/2017  . Rectal mass   . Irregular prostate   . Sleep disturbance   . Dysphagia   . Diabetes mellitus type 2 in nonobese (HCC)   . Labile blood glucose   . Debility 09/10/2017  . CHF (congestive heart failure) ([place])   . Type 2 diabetes mellitus with retinopathy, with long-term current use of insulin ([place])   . Stage 3 chronic kidney disease ([place])   . Cardiogenic shock ([place])   . Acute respiratory failure ([place])   . Tachypnea   . Leukocytosis   . Acute blood loss anemia   . Pressure injury of skin 08/27/2017  . Acute respiratory failure with hypoxemia ([place])   . Acute pulmonary edema (HCC)   . Metabolic alkalosis   . Presence of left ventricular assist device (LVAD) ([place])   . S/P TVR (tricuspid valve repair) 08/17/2017  . Goals of care, counseling/discussion   . Palliative care encounter   . Acute on chronic systolic (congestive) heart failure ([place]) 08/11/2017  . Acute on chronic combined systolic and diastolic CHF (congestive heart failure) ([place]) 11/19/2016  . Acute on chronic left and right systolic w/ diastolic heart failure, NYHA class 2 ([place]) 11/18/2016  . Hx of atrial flutter 11/18/2016  . CAD (coronary artery disease)-RCA-T, 70% PL (off CFX), 99% Prox LAD/90% Dist LAD, S/P TAXUS stent x 2 11/18/2016  . HTN (hypertension) 11/18/2016  . Diabetes mellitus with complication ([place]) 74/10/1446  . Thrombocytopenia ([place]) 11/18/2016  . Bilateral lower extremity edema 11/12/2016  . Diabetic polyneuropathy associated with diabetes mellitus due to underlying condition ([place]) 05/29/2015  . Chronic systolic CHF (congestive heart failure) ([place]) 02/05/2014  . Medtronic  biventricular ICD, serial number  [id number]  02/05/2014  . CAD (coronary artery disease), native coronary artery 01/08/2014  . Chronic renal disease, stage III ([place]) 01/08/2014  . Heart failure ([place]) 12/16/2013  . Mitral regurgitation 04/17/2013  . Chronic systolic dysfunction of left ventricle 06/29/2012  . Atrial flutter ([place]) 06/03/2012  . Acute kidney injury (nontraumatic) ([place]) 05/31/2012  . Chronic anticoagulation   . Acute on chronic systolic heart failure ([place]) 05/21/2012  . Nausea with vomiting 05/18/2012  . PAF (paroxysmal atrial fibrillation) ([place]) 05/17/2012  . Orthopnea 05/17/2012  . TIA (transient ischemic attack) 05/29/2010  . CHEST PAIN 11/13/2008  . VENTRICULAR TACHYCARDIA 08/14/2008  . Automatic implantable cardioverter-defibrillator in situ 08/14/2008  . Type 2 diabetes with nephropathy ([place]) 08/11/2008  . DYSLIPIDEMIA 08/11/2008  . Cardiomyopathy, ischemic 08/11/2008  . Disorder resulting from impaired renal function 08/11/2008  . HYPERCHOLESTEROLEMIA 04/29/2006  . HYPERTENSION, BENIGN SYSTEMIC 04/29/2006  . MYOCARDIAL INFARCTION, OLD 04/29/2006  . Coronary atherosclerosis 04/29/2006  . NEPHROLITHIASIS 04/29/2006   Past Medical History:  Diagnosis Date  . AICD (automatic cardioverter/defibrillator) present 02/05/2014   Upgrade to Medtronic biventricular ICD, serial number  [id number]   . Atrial flutter ([place]) 04/2012   s/p TEE-EPS+RFCA 04/2012  . CAD (coronary artery disease) 1856,3149 X 2    RCA-T, 70% PL (off CFX), 99% Prox LAD/90% Dist LAD, S/P TAXUS stent x 2  . CHF (congestive heart failure) ([place])   . Chronic anticoagulation   . Chronic systolic heart failure ([place])   . CKD (  chronic kidney disease)   . Diabetic retinopathy ([place])   . DM type 2 (diabetes mellitus, type 2) (HCC)    insulin dependent  . HTN (hypertension)   . Hypercholesteremia    ablation  . ICD (implantable cardiac defibrillator) in place   . Ischemic cardiomyopathy March 2015    20-25% 2D   . Nephrolithiasis   . Ventricular tachycardia [facility])    Past Surgical History:  Procedure Laterality Date  . ATRIAL FLUTTER ABLATION N/A 05/19/2012   Procedure: ATRIAL FLUTTER ABLATION;  Surgeon: Thompson Grayer, MD;  Location[facility] C[facility] LAB;  Service: Cardiovascular;  Laterality: N/A;  . BI-VENTRICULAR IMPLANTABLE CARDIOVERTER DEFIBRILLATOR UPGRADE N/A 02/05/2014   Procedure: BI-VENTRICULAR IMPLANTABLE CARDIOVERTER DEFIBRILLATOR UPGRADE;  Surgeon: Evans Lance, MD;  Location: [facility] CATH LAB;  Service: Cardiovascular;  Laterality: N/A;  . BIV ICD GENERTAOR CHANGE OUT  02/05/2014   Upgrade to Medtronic biventricular ICD, serial number  [id number] by Dr. [name]  . CARDIAC DEFIBRILLATOR PLACEMENT  2007    Medtronic Maximo VR, serial number [id number]  . INSERTION OF IMPLANTABLE LEFT VENTRICULAR ASSIST DEVICE N/A 08/16/2017   Procedure: INSERTION OF IMPLANTABLE LEFT VENTRICULAR ASSIST DEVICE-HM3;  Surgeon: Ivin Poot, MD;  Location: [place];  Service: Open Heart Surgery;  Laterality: N/A;  . IR FLUORO GUIDE CV LINE RIGHT  08/12/2017  . IR US GUIDE VASC ACCESS RIGHT  08/12/2017  . MEDIASTINAL EXPLORATION  08/17/2017   Procedure: MEDIASTINAL REXPLORATION with evacuation of hematoma;  Surgeon: Prescott Gum, Collier Salina, MD;  Location[facility];  Service: Open Heart Surgery;;  . PERCUTANEOUS CORONARY STENT INTERVENTION (PCI-S)  January 2002   PTCA/Stent Distal RCA  . PERCUTANEOUS CORONARY STENT INTERVENTION (PCI-S)  June 2002   PTCA/Stent x 3 RCA, thrombolysis - failed  . PERCUTANEOUS CORONARY STENT INTERVENTION (PCI-S)  July 2006   TAXUS stents to prox and distal LAD  . RIGHT HEART CATH N/A 07/22/2017   Procedure: RIGHT HEART CATH;  Surgeon: Jolaine Artist, MD;  Location: [place] CV LAB;  Service: Cardiovascular;  Laterality: N/A;  . RIGHT HEART CATH N/A 08/13/2017   Procedure: RIGHT HEART CATH - swan;  Surgeon: Jolaine Artist, MD;  Location: [place] CV LAB;  Service: Cardiovascular;   Laterality: N/A;  . RIGHT/LEFT HEART CATH AND CORONARY ANGIOGRAPHY N/A 02/18/2017   Procedure: RIGHT/LEFT HEART CATH AND CORONARY ANGIOGRAPHY;  Surgeon: Jolaine Artist, MD;  Location: [place] CV LAB;  Service: Cardiovascular;  Laterality: N/A;  . STERNAL CLOSURE N/A 08/19/2017   Procedure: STERNAL CLOSURE;  Surgeon: Ivin Poot, MD;  Location: [place];  Service: Thoracic;  Laterality: N/A;  . TEE WITHOUT CARDIOVERSION N/A 08/16/2017   Procedure: TRANSESOPHAGEAL ECHOCARDIOGRAM (TEE);  Surgeon: Prescott Gum, Collier Salina, MD;  Location: [place];  Service: Open Heart Surgery;  Laterality: N/A;  . TEE WITHOUT CARDIOVERSION N/A 08/19/2017   Procedure: TRANSESOPHAGEAL ECHOCARDIOGRAM (TEE);  Surgeon: Prescott Gum, Collier Salina, MD;  Location: [place];  Service: Thoracic;  Laterality: N/A;  . TRICUSPID VALVE REPLACEMENT N/A 08/16/2017   Procedure: TRICUSPID VALVE REPAIR using Oletta Lamas MC3 Ring size 30;  Surgeon: Ivin Poot, MD;  Location: [place];  Service: Open Heart Surgery;  Laterality: N/A;    Subjective  Subjective: pt's wife was more confident after another education session and getting suplies. Patient and Family Stated Goals: Heal wound Prior Treatments: Wet to dry dressing changes with Santyl  Pain Score: Pain Score: 6   Objective  Cognition: Impaired:  Communication: Impaired:  Mobility: Impaired:  ROM: Impaired:  Sensation:  Protective sensation (10g): not tested Signs of venous insufficiency:N/A Signs of arterial insufficiency: N/A  Wound Assessment  Pressure Injury 08/27/17 Unstageable - Full thickness tissue loss in which the base of the ulcer is covered by slough (yellow, tan, gray, green or brown) and/or eschar (tan, brown or black) in the wound bed. 8.5 x 6 (90% darkened 5% yellow and 5% pink (Active)  Wound Image   09/20/2017 11:28 AM  Dressing Type Gauze (Comment);Barrier Film (skin prep);Moist to dry 09/25/2017  1:02 PM  Dressing Clean;Dry;Intact;Changed 09/25/2017  1:02 PM  Dressing  Change Frequency Daily 09/25/2017  1:02 PM  State of Healing Early/partial granulation 09/25/2017  1:02 PM  Site / Wound Assessment Pink;Yellow 09/25/2017  1:02 PM  % Wound base Red or Granulating 45% 09/25/2017  1:02 PM  % Wound base Yellow/Fibrinous Exudate 55% 09/25/2017  1:02 PM  % Wound base Black/Eschar 0% 09/25/2017  1:02 PM  % Wound base Other/Granulation Tissue (Comment) 0% 09/25/2017  1:02 PM  Peri-wound Assessment Intact;Other (Comment) 09/25/2017  1:02 PM  Wound Length (cm) 9.1 cm 09/20/2017 11:28 AM  Wound Width (cm) 7.5 cm 09/20/2017 11:28 AM  Wound Depth (cm) 0.2 cm 09/20/2017 11:28 AM  Wound Surface Area (cm^2) 68.25 cm^2 09/20/2017 11:28 AM  Wound Volume (cm^3) 13.65 cm^3 09/20/2017 11:28 AM  Tunneling (cm) 0 09/10/2017 10:51 AM  Undermining (cm) 0 09/10/2017 10:51 AM  Margins Unattached edges (unapproximated) 09/25/2017  1:02 PM  Drainage Amount Moderate 09/25/2017  1:02 PM  Drainage Description Purulent;Serosanguineous 09/25/2017  1:02 PM  Treatment Cleansed;Hydrotherapy (Pulse lavage);Debridement (Selective) 09/25/2017  1:02 PM      Hydrotherapy Pulsed lavage therapy - wound location: R buttock Pulsed Lavage with Suction (psi): 8 psi(8-12) Pulsed Lavage with Suction - Normal Saline Used: 1000 mL Pulsed Lavage Tip: Tip with splash shield Selective Debridement Selective Debridement - Location: R buttock Selective Debridement - Tools Used: Other (comment);Scalpel(for scraping) Selective Debridement - Tissue Removed: yellow necratic tissue scraped(too tightly adhered to remove)   Wound Assessment and Plan  Wound Therapy - Assess/Plan/Recommendations Wound Therapy - Clinical Statement: Pt's wife verbilized the whole dressing change and watched another treatment.  With supplies given, pt's wife relayed that she was comfortable doing interim changes. Wound Therapy - Functional Problem List: Decreased tolerance for OOB and position changes Factors Delaying/Impairing Wound Healing:  Diabetes Mellitus;Immobility;Multiple medical problems Hydrotherapy Plan: Dressing change;Patient/family education;Pulsatile lavage with suction Wound Therapy - Frequency: 6X / week Wound Therapy - Follow Up Recommendations: [place] Wound Plan: See above  Wound Therapy Goals- Improve the function of patient's integumentary system by progressing the wound(s) through the phases of wound healing (inflammation - proliferation - remodeling) by: Decrease Necrotic Tissue to: 40 Decrease Necrotic Tissue - Progress: Progressing toward goal Increase Granulation Tissue to: 60 Increase Granulation Tissue - Progress: Progressing toward goal Goals/treatment plan/discharge plan were made with and agreed upon by patient/family: Yes Time For Goal Achievement: 7 days Wound Therapy - Potential for Goals: Good  Goals will be updated until maximal potential achieved or discharge criteria met.  Discharge criteria: when goals achieved, discharge from hospital, MD decision/surgical intervention, no progress towards goals, refusal/missing three consecutive treatments without notification or medical reason.  Tessie Fass Dae Antonucci 09/25/2017, 1:08 PM  09/25/2017  Donnella Sham, [place] [phone]  (pager)

## 2017-09-27 ENCOUNTER — Telehealth (HOSPITAL_COMMUNITY): Payer: Self-pay | Admitting: Physical Medicine and Rehabilitation

## 2017-09-27 ENCOUNTER — Telehealth: Payer: Self-pay | Admitting: *Deleted

## 2017-09-27 NOTE — Telephone Encounter (Signed)
I have him scheduled for an OV on 10/01/17 at 2:45 pm. I have left a message on the home phone and requested a call back to confirm.

## 2017-09-27 NOTE — Telephone Encounter (Signed)
Dr Fuller Plan please review and advise on how you want to proceed with this patient.

## 2017-09-27 NOTE — Telephone Encounter (Signed)
Pt was on TCM report he was admitted to inpatient rehab on 09/10/17. Pt has been D/C from rehab on 09/25/17. Per chart a hosp f/u appt has been scheduled for 10/15/17 w/Ashleigh. Will call pt to follow-up and confirm appt that has been schedule.Marland KitchenJohny Chess

## 2017-09-27 NOTE — Progress Notes (Signed)
Social Work  Discharge Note  The overall goal for the admission was met for:   Discharge location: Yes - home with wife able to provide 24/7 supervision/ assist  Length of Stay: Yes - 15 days  Discharge activity level: Yes - supervision overall  Home/community participation: Yes  Services provided included: MD, RD, PT, OT, SLP, RN, TR, Pharmacy and [place]: Private Insurance: [employer]  Follow-up services arranged: Home Health: RN, PT, OT, ST via [place], DME: 18x18 lightweight w/c with Roho cushion, rolling walker, hospital bed with low air loss mattress and 3n1 commode via [facility] and Patient/Family has no preference for HH/DME agencies  Comments (or additional information):  Patient/Family verbalized understanding of follow-up arrangements: Yes  Individual responsible for coordination of the follow-up plan: pt  Confirmed correct DME delivered: Ardelle Haliburton 09/27/2017    Aren Pryde

## 2017-09-27 NOTE — Telephone Encounter (Signed)
Called pt to conform appt that has been scheduled for 8/16 w/Ashleigh. No answer LMOM RTC.Marland KitchenJohny Chess

## 2017-09-27 NOTE — Telephone Encounter (Signed)
Please schedule colonoscopy at hospital. I could do this the week of 8/12 on Tues, Wed or Thurs in the morning, preferable at 8:00 or 8:30. Needs clearance for a 5 day hold of Coumadin.

## 2017-09-27 NOTE — Telephone Encounter (Signed)
Contacted wife to review changes in Demadex per Dr. Clayborne Dana note from Saturday. She relayed that medications were reviewed with changes by Dr. [name] at discharge.  HHRN has not been in yet and LCSW to contact hospital liason to help expedite this. Dressing change to sacrum reviewed again with wife.

## 2017-09-28 ENCOUNTER — Telehealth (HOSPITAL_COMMUNITY): Payer: Self-pay | Admitting: *Deleted

## 2017-09-28 NOTE — Telephone Encounter (Addendum)
[name] [name], [name]. I did not schedule him as a direct to procedure because of anti coagulation. I was not aware of the LVAD. No openings on any  schedule until the next schedule rolls out.

## 2017-09-28 NOTE — Telephone Encounter (Signed)
This pt just had LVAD placed .Marland Kitchen They wont want him off Coumadin , and he will need procedure at [facility] , possibly be admitted ahead - will need to sort out at office visit - who is he scheduled to see ?

## 2017-09-28 NOTE — Telephone Encounter (Signed)
Tried calling pt again still no answer LMOM RTC to confirm appt for 10/15/17.Marland KitchenJohny Chess

## 2017-09-28 NOTE — Telephone Encounter (Signed)
Called wife to check on pt. She reports she is doing daily dressing changes and is concerned DL is "out further" than it was in hospital. Denies trauma to site, pt is down 4 lbs on home wts. She will send picture to VAD Coordinator for review at next dressing change. May advance to every other day dressing changes since drainage is down and pt still has skin excoriation from tape.   [facility] called her Sunday (day of discharge), but hasn't been to house. Pt is due for fingerstick INR. Asked wife to have them check INR with first visit and text results to VAD coordinator.  Updated Carolynn Sayers, [facility]. [place] nurse had been calling wrong #; home contact phone number provided.  Zada Girt RN, [place] Coordinator [phone]

## 2017-09-29 ENCOUNTER — Other Ambulatory Visit: Payer: Self-pay | Admitting: *Deleted

## 2017-09-29 ENCOUNTER — Ambulatory Visit (HOSPITAL_COMMUNITY): Payer: Self-pay | Admitting: Pharmacist

## 2017-09-29 DIAGNOSIS — I723 Aneurysm of iliac artery: Secondary | ICD-10-CM | POA: Diagnosis not present

## 2017-09-29 DIAGNOSIS — Z95811 Presence of heart assist device: Secondary | ICD-10-CM | POA: Diagnosis not present

## 2017-09-29 DIAGNOSIS — Z7901 Long term (current) use of anticoagulants: Secondary | ICD-10-CM | POA: Diagnosis not present

## 2017-09-29 DIAGNOSIS — M5136 Other intervertebral disc degeneration, lumbar region: Secondary | ICD-10-CM | POA: Diagnosis not present

## 2017-09-29 DIAGNOSIS — E7800 Pure hypercholesterolemia, unspecified: Secondary | ICD-10-CM | POA: Diagnosis not present

## 2017-09-29 DIAGNOSIS — I5082 Biventricular heart failure: Secondary | ICD-10-CM | POA: Diagnosis not present

## 2017-09-29 DIAGNOSIS — Z95810 Presence of automatic (implantable) cardiac defibrillator: Secondary | ICD-10-CM | POA: Diagnosis not present

## 2017-09-29 DIAGNOSIS — D631 Anemia in chronic kidney disease: Secondary | ICD-10-CM | POA: Diagnosis not present

## 2017-09-29 DIAGNOSIS — I130 Hypertensive heart and chronic kidney disease with heart failure and stage 1 through stage 4 chronic kidney disease, or unspecified chronic kidney disease: Secondary | ICD-10-CM | POA: Diagnosis not present

## 2017-09-29 DIAGNOSIS — I4892 Unspecified atrial flutter: Secondary | ICD-10-CM | POA: Diagnosis not present

## 2017-09-29 DIAGNOSIS — Z4800 Encounter for change or removal of nonsurgical wound dressing: Secondary | ICD-10-CM | POA: Diagnosis not present

## 2017-09-29 DIAGNOSIS — L89322 Pressure ulcer of left buttock, stage 2: Secondary | ICD-10-CM | POA: Diagnosis not present

## 2017-09-29 DIAGNOSIS — I5084 End stage heart failure: Secondary | ICD-10-CM | POA: Diagnosis not present

## 2017-09-29 DIAGNOSIS — I428 Other cardiomyopathies: Secondary | ICD-10-CM | POA: Diagnosis not present

## 2017-09-29 DIAGNOSIS — Z794 Long term (current) use of insulin: Secondary | ICD-10-CM | POA: Diagnosis not present

## 2017-09-29 DIAGNOSIS — I4891 Unspecified atrial fibrillation: Secondary | ICD-10-CM | POA: Diagnosis not present

## 2017-09-29 DIAGNOSIS — E1151 Type 2 diabetes mellitus with diabetic peripheral angiopathy without gangrene: Secondary | ICD-10-CM | POA: Diagnosis not present

## 2017-09-29 DIAGNOSIS — I5022 Chronic systolic (congestive) heart failure: Secondary | ICD-10-CM | POA: Diagnosis not present

## 2017-09-29 DIAGNOSIS — E1122 Type 2 diabetes mellitus with diabetic chronic kidney disease: Secondary | ICD-10-CM | POA: Diagnosis not present

## 2017-09-29 DIAGNOSIS — N183 Chronic kidney disease, stage 3 (moderate): Secondary | ICD-10-CM | POA: Diagnosis not present

## 2017-09-29 DIAGNOSIS — I2510 Atherosclerotic heart disease of native coronary artery without angina pectoris: Secondary | ICD-10-CM | POA: Diagnosis not present

## 2017-09-29 DIAGNOSIS — L89313 Pressure ulcer of right buttock, stage 3: Secondary | ICD-10-CM | POA: Diagnosis not present

## 2017-09-29 DIAGNOSIS — Z952 Presence of prosthetic heart valve: Secondary | ICD-10-CM | POA: Diagnosis not present

## 2017-09-29 DIAGNOSIS — R1310 Dysphagia, unspecified: Secondary | ICD-10-CM | POA: Diagnosis not present

## 2017-09-29 DIAGNOSIS — M1990 Unspecified osteoarthritis, unspecified site: Secondary | ICD-10-CM | POA: Diagnosis not present

## 2017-09-29 LAB — POCT INR: INR: 2 (ref 2–3)

## 2017-09-29 NOTE — Patient Outreach (Signed)
[facility]) Care Management  09/29/2017  [name] [name] September 23, 1950 [id number]   Transition of Care Referral   Referral Date:  09/28/17 Referral Source: [facility] referral - HTA referral  Date of Admission: 09/10/17 Diagnosis: s/p LVAD Date of Discharge: 09/25/17 Facility: [facility] stay for 45 days per [name] [employer]: health team advantage  Outreach attempt # 1  No answer at listed mobile number in the referral. [place] RN CM left HIPAA compliant voicemail message along with CM's contact info.   Successful contact made at the home number listed in Epic  Patient is able to verify HIPAA His wife, [name] assisted related to [name] [name] speaking at a whisper at this time   Reviewed and addressed referral to [name] with patient [name] [name] states presently his only problem is his bottom is sore related to his wound  [name] [name] only concern is remaining with Velora Heckler and voiced unsure about alliance services  CM and [name] [name] discuss the relationship of these to providers CM encouraged pressure relief, staying off his bottom to allow the wound to heal [name] [name] reports the wound is still draining but minimal with yellow tissue noted She confirms the tissue is not pink at this time but was checked by the home health RN today  [name] [name] is doing the dressing changes as ordered Wt today was 204 lbs, on 06/03/17 he was 230 lbs at 5'10" Loss of 26 lbs but he and [name] [name] are not unhappy about this  temp today was 97.8 per [name] has a pulse oximeter and will start checking O2 sats cbgs in am at home ranging from 80-100   Social: [name] [name] lives at home with his wife as primary caregiver Presently he is independent/assist with his care. [name] Gover states she assists by minimally to get private areas and to complete errands and transportation. He is using thick 1 scoop vs 2 scoops and will have a speech therapist arrive soon. Has already had advanced home care services for RN and PT today   Conditions:  chronic anticoagulation, chronic systolic CHF, presence of left ventricular assist device, hx of afib, CAS stage 3 ckd, acute blood loss anemia, dysphagia, DM type 2 sleep disturbance, rectal mass, irregular prostate, stage 2 sacral decubitis  Medications: they denies concerns with taking medications as prescribed, affording medications, side effects of medications and questions about medications. They reported that levemir pens and the ointment for decubitus is expensive but Levemir changed to 70/30 insulin and the ointment may be changed after wound heels.  Appointments: [name] Burks is aware of the appointments and states she is able to get [name] [name] to appointments without issues at this time have appointments on 10/01/17 to GI, 10/04/16 to cardiology and 10/15/17 to primary MD  Advance directives: He denies need for assist with advance directives   Consent: [facility] RN CM reviewed [facility] services with patient. They deny need of services from [facility] Community/Telephonic RN CM, pharmacy or SW at this time  Advised patient that other post discharge calls may occur to assess how the patient is doing following the recent hospitalization. Patient voiced understanding and was appreciative of f/u call.  Plan: [facility] RN CM will follow up with [name] [name] within 7-14 business days and plan for case closure   Kimberly L. Lavina Hamman, RN, BSN, CCM [facility] Telephonic Care Management Care Coordinator Direct number [phone]  Main [facility] number [phone] Fax number [phone]

## 2017-09-30 ENCOUNTER — Encounter (HOSPITAL_COMMUNITY): Payer: Self-pay | Admitting: *Deleted

## 2017-10-01 ENCOUNTER — Other Ambulatory Visit (HOSPITAL_COMMUNITY): Payer: Self-pay

## 2017-10-01 ENCOUNTER — Ambulatory Visit: Payer: PPO | Admitting: Physician Assistant

## 2017-10-01 NOTE — Progress Notes (Signed)
Paramedicine Encounter    Patient ID: [name] [name], male    DOB: [birth year], 67 y.o.   MRN: [id number]   Patient Care Team: [name], NP as PCP - General (Nurse Practitioner) Barbaraann Faster, RN as [place] Management  Patient Active Problem List   Diagnosis Date Noted  . Long term (current) use of anticoagulants 09/29/2017  . Decubitus ulcer of sacral region, stage 2 09/24/2017  . Rectal mass   . Irregular prostate   . Sleep disturbance   . Dysphagia   . Diabetes mellitus type 2 in nonobese (HCC)   . Labile blood glucose   . Debility 09/10/2017  . CHF (congestive heart failure) ([place])   . Type 2 diabetes mellitus with retinopathy, with long-term current use of insulin ([place])   . Stage 3 chronic kidney disease ([place])   . Cardiogenic shock ([place])   . Acute respiratory failure ([place])   . Tachypnea   . Leukocytosis   . Acute blood loss anemia   . Pressure injury of skin 08/27/2017  . Acute respiratory failure with hypoxemia ([place])   . Acute pulmonary edema (HCC)   . Metabolic alkalosis   . Presence of left ventricular assist device (LVAD) ([place])   . S/P TVR (tricuspid valve repair) 08/17/2017  . Goals of care, counseling/discussion   . Palliative care encounter   . Acute on chronic systolic (congestive) heart failure ([place]) 08/11/2017  . Acute on chronic combined systolic and diastolic CHF (congestive heart failure) ([place]) 11/19/2016  . Acute on chronic left and right systolic w/ diastolic heart failure, NYHA class 2 ([place]) 11/18/2016  . Hx of atrial flutter 11/18/2016  . CAD (coronary artery disease)-RCA-T, 70% PL (off CFX), 99% Prox LAD/90% Dist LAD, S/P TAXUS stent x 2 11/18/2016  . HTN (hypertension) 11/18/2016  . Diabetes mellitus with complication ([place]) 54/56/2563  . Thrombocytopenia ([place]) 11/18/2016  . Bilateral lower extremity edema 11/12/2016  . Diabetic polyneuropathy associated with diabetes mellitus due to underlying condition ([place])  05/29/2015  . Chronic systolic CHF (congestive heart failure) ([place]) 02/05/2014  . Medtronic biventricular ICD, serial number  [id number]  02/05/2014  . CAD (coronary artery disease), native coronary artery 01/08/2014  . Chronic renal disease, stage III ([place]) 01/08/2014  . Heart failure ([place]) 12/16/2013  . Mitral regurgitation 04/17/2013  . Chronic systolic dysfunction of left ventricle 06/29/2012  . Atrial flutter ([place]) 06/03/2012  . Acute kidney injury (nontraumatic) ([place]) 05/31/2012  . Chronic anticoagulation   . Acute on chronic systolic heart failure ([place]) 05/21/2012  . Nausea with vomiting 05/18/2012  . PAF (paroxysmal atrial fibrillation) ([place]) 05/17/2012  . Orthopnea 05/17/2012  . TIA (transient ischemic attack) 05/29/2010  . CHEST PAIN 11/13/2008  . VENTRICULAR TACHYCARDIA 08/14/2008  . Automatic implantable cardioverter-defibrillator in situ 08/14/2008  . Type 2 diabetes with nephropathy ([place]) 08/11/2008  . DYSLIPIDEMIA 08/11/2008  . Cardiomyopathy, ischemic 08/11/2008  . Disorder resulting from impaired renal function 08/11/2008  . HYPERCHOLESTEROLEMIA 04/29/2006  . HYPERTENSION, BENIGN SYSTEMIC 04/29/2006  . MYOCARDIAL INFARCTION, OLD 04/29/2006  . Coronary atherosclerosis 04/29/2006  . NEPHROLITHIASIS 04/29/2006    Current Outpatient Medications:  .  ascorbic acid (VITAMIN C) 500 MG tablet, Take 1 tablet (500 mg total) by mouth 2 (two) times daily., Disp: 100 tablet, Rfl: 0 .  aspirin 81 MG chewable tablet, Chew 1 tablet (81 mg total) by mouth daily., Disp: , Rfl:  .  bisacodyl (DULCOLAX) 5 MG EC tablet, Take 1-2 tablets (5-10 mg total)  by mouth daily as needed for moderate constipation., Disp: 60 tablet, Rfl: 0 .  collagenase (SANTYL) ointment, Apply topically daily. Apply layer to area, cover with damp dressing. Then cover with bulky dry dressing and tape in place., Disp: 30 g, Rfl: 2 .  doxycycline (VIBRA-TABS) 100 MG tablet, Take 1 tablet (100 mg total) by mouth  every 12 (twelve) hours., Disp: 12 tablet, Rfl: 0 .  Insulin Detemir (LEVEMIR FLEXTOUCH) 100 UNIT/ML Pen, Inject 25 Units into the skin at bedtime., Disp: 15 mL, Rfl: 11 .  magnesium oxide (MAG-OX) 400 (241.3 Mg) MG tablet, Take 1 tablet (400 mg total) by mouth 2 (two) times daily., Disp: 60 tablet, Rfl: 0 .  Multiple Vitamin (MULTIVITAMIN WITH MINERALS) TABS tablet, Take 1 tablet by mouth daily., Disp: , Rfl:  .  nystatin (MYCOSTATIN) 100000 UNIT/ML suspension, Take 5 mLs (500,000 Units total) by mouth 4 (four) times daily., Disp: 60 mL, Rfl: 0 .  potassium chloride (K-DUR) 10 MEQ tablet, Take 3 tablets (30 mEq total) by mouth 2 (two) times daily., Disp: 180 tablet, Rfl: 0 .  protein supplement shake (PREMIER PROTEIN) LIQD, Take 325 mLs (11 oz total) by mouth 2 (two) times daily between meals., Disp: , Rfl: 0 .  spironolactone (ALDACTONE) 25 MG tablet, Take 1 tablet (25 mg total) by mouth daily., Disp: 30 tablet, Rfl: 0 .  warfarin (COUMADIN) 2.5 MG tablet, Take 1 tablet (2.5 mg total) by mouth daily at 6 PM., Disp: 30 tablet, Rfl: 0 .  clonazePAM (KLONOPIN) 0.25 MG disintegrating tablet, Take 1 tablet (0.25 mg total) by mouth 2 (two) times daily as needed (anxiety). (Patient not taking: Reported on 09/29/2017), Disp: 60 tablet, Rfl: 0 .  famotidine (PEPCID) 20 MG tablet, Take 1 tablet (20 mg total) by mouth 2 (two) times daily. (Patient not taking: Reported on 09/29/2017), Disp: 60 tablet, Rfl: 0 .  Insulin Pen Needle (COMFORT EZ PEN NEEDLES) 32G X 6 MM MISC, 1 application by Does not apply route at bedtime., Disp: 100 each, Rfl: 0 .  Maltodextrin-Xanthan Gum (RESOURCE THICKENUP CLEAR) POWD, Take 1 g by mouth as needed. (Patient not taking: Reported on 10/01/2017), Disp: 1 Can, Rfl: 0 .  torsemide (DEMADEX) 20 MG tablet, Take 3 tablets (60 mg total) by mouth daily., Disp: 90 tablet, Rfl: 0 .  traZODone (DESYREL) 50 MG tablet, Take 1 tablet (50 mg total) by mouth at bedtime. (Patient not taking: Reported  on 09/29/2017), Disp: 30 tablet, Rfl: 0 Allergies  Allergen Reactions  . Penicillins Hives, Itching and Other (See Comments)    Has patient had a PCN reaction causing immediate rash, facial/tongue/throat swelling, SOB or lightheadedness with hypotension:# # Yes # # Has patient had a PCN reaction causing severe rash involving mucus membranes or skin necrosis: No Has patient had a PCN reaction that required hospitalization: No Has patient had a PCN reaction occurring within the last 10 years: No If all of the above answers are "NO", then may proceed with Cephalosporin use.      Social History   Socioeconomic History  . Marital status: Married    Spouse name: Not on file  . Number of children: 4  . Years of education: 45  . Highest education level: Not on file  Occupational History  . Occupation: Psychiatrist - currently unemployed  Social Needs  . Financial resource strain: Not very hard  . Food insecurity:    Worry: Never true    Inability: Never true  . Transportation needs:  Medical: No    Non-medical: No  Tobacco Use  . Smoking status: Former Smoker    Packs/day: 1.00    Years: 29.00    Pack years: 29.00    Types: Cigarettes    Last attempt to quit: 03/02/2000    Years since quitting: 17.5  . Smokeless tobacco: Never Used  Substance and Sexual Activity  . Alcohol use: No    Alcohol/week: 0.0 oz  . Drug use: No  . Sexual activity: Yes    Partners: Female    Birth control/protection: None  Lifestyle  . Physical activity:    Days per week: Patient refused    Minutes per session: Patient refused  . Stress: Only a little  Relationships  . Social connections:    Talks on phone: Patient refused    Gets together: Patient refused    Attends religious service: Patient refused    Active member of club or organization: Patient refused    Attends meetings of clubs or organizations: Patient refused    Relationship status: Patient refused  . Intimate partner violence:     Fear of current or ex partner: Patient refused    Emotionally abused: Patient refused    Physically abused: Patient refused    Forced sexual activity: Patient refused  Other Topics Concern  . Not on file  Social History Narrative   Pt lives with wife in a one story home - married for 74 years. He hunts regularly and is active, walking > 1 mile at times without difficulty.  Has 4 children.     Retired Horticulturist, commercial.  Education: some college.    Physical Exam  Constitutional: He is oriented to person, place, and time.  Pulmonary/Chest: Effort normal and breath sounds normal.  Abdominal: Soft.  Musculoskeletal: He exhibits no edema.  Neurological: He is alert and oriented to person, place, and time.  Skin: Skin is warm and dry.  Psychiatric: He has a normal mood and affect.        Future Appointments  Date Time Provider [place]  10/04/2017 11:00 AM [facility] None  10/04/2017  6:10 PM [facility] DEVICE REMOTES [facility]  10/07/2017  9:30 AM Barbaraann Faster, RN [facility] None  10/15/2017  1:00 PM [name], NP [facility]  11/24/2017 11:30 AM Narda Amber K, DO [facility] None   BP 114/89 (BP Location: Left Arm, Patient Position: Sitting, Cuff Size: Large)   Resp 16   Wt 203 lb 9.6 oz (92.4 kg)   SpO2 93%   BMI 28.40 kg/m  Weight yesterday- 202.8 lb Last visit weight- n/a   HUM- Yes ALARMS- NOSEBLEEDS- No URINE COLOR- Yellow STOOL COLOR- Brown  Mr Baik was seen at home today for the first time since coming home after his LVAD implant. He reported feeling tired but said he was slowly regaining his strength. He said he does not sleep well at night but "gets plenty of sleep" by way of naps through the day. He denied SOB, headache, dizziness or orthopnea. He has been compliant with his medications which were verified with his wife whom is filling his pillbox. No further assistance was necessary at the time of my visit.     Jacquiline Doe, EMT 10/01/17  ACTION: Home visit completed Next visit planned for 1 week

## 2017-10-04 ENCOUNTER — Other Ambulatory Visit (HOSPITAL_COMMUNITY): Payer: Self-pay | Admitting: Unknown Physician Specialty

## 2017-10-04 ENCOUNTER — Ambulatory Visit (HOSPITAL_COMMUNITY)
Admission: RE | Admit: 2017-10-04 | Discharge: 2017-10-04 | Disposition: A | Discharge status: Home / Self Care | Payer: PPO | Source: Ambulatory Visit | Attending: Internal Medicine | Admitting: Internal Medicine

## 2017-10-04 ENCOUNTER — Other Ambulatory Visit (HOSPITAL_COMMUNITY): Payer: Self-pay | Admitting: *Deleted

## 2017-10-04 ENCOUNTER — Ambulatory Visit (HOSPITAL_COMMUNITY): Payer: Self-pay | Admitting: Pharmacist

## 2017-10-04 ENCOUNTER — Ambulatory Visit (INDEPENDENT_AMBULATORY_CARE_PROVIDER_SITE_OTHER): Payer: PPO

## 2017-10-04 VITALS — BP 99/44 | HR 57 | Wt 208.4 lb

## 2017-10-04 DIAGNOSIS — Z7901 Long term (current) use of anticoagulants: Secondary | ICD-10-CM

## 2017-10-04 DIAGNOSIS — I5022 Chronic systolic (congestive) heart failure: Secondary | ICD-10-CM

## 2017-10-04 DIAGNOSIS — I130 Hypertensive heart and chronic kidney disease with heart failure and stage 1 through stage 4 chronic kidney disease, or unspecified chronic kidney disease: Secondary | ICD-10-CM | POA: Insufficient documentation

## 2017-10-04 DIAGNOSIS — I4891 Unspecified atrial fibrillation: Secondary | ICD-10-CM | POA: Insufficient documentation

## 2017-10-04 DIAGNOSIS — R490 Dysphonia: Secondary | ICD-10-CM | POA: Insufficient documentation

## 2017-10-04 DIAGNOSIS — Z7982 Long term (current) use of aspirin: Secondary | ICD-10-CM | POA: Diagnosis not present

## 2017-10-04 DIAGNOSIS — E7800 Pure hypercholesterolemia, unspecified: Secondary | ICD-10-CM | POA: Insufficient documentation

## 2017-10-04 DIAGNOSIS — I10 Essential (primary) hypertension: Secondary | ICD-10-CM | POA: Diagnosis not present

## 2017-10-04 DIAGNOSIS — N179 Acute kidney failure, unspecified: Secondary | ICD-10-CM | POA: Diagnosis not present

## 2017-10-04 DIAGNOSIS — I4892 Unspecified atrial flutter: Secondary | ICD-10-CM

## 2017-10-04 DIAGNOSIS — N183 Chronic kidney disease, stage 3 unspecified: Secondary | ICD-10-CM

## 2017-10-04 DIAGNOSIS — E119 Type 2 diabetes mellitus without complications: Secondary | ICD-10-CM | POA: Diagnosis not present

## 2017-10-04 DIAGNOSIS — L8990 Pressure ulcer of unspecified site, unspecified stage: Secondary | ICD-10-CM | POA: Diagnosis not present

## 2017-10-04 DIAGNOSIS — Z79899 Other long term (current) drug therapy: Secondary | ICD-10-CM | POA: Insufficient documentation

## 2017-10-04 DIAGNOSIS — I472 Ventricular tachycardia: Secondary | ICD-10-CM

## 2017-10-04 DIAGNOSIS — Z95810 Presence of automatic (implantable) cardiac defibrillator: Secondary | ICD-10-CM

## 2017-10-04 DIAGNOSIS — I2510 Atherosclerotic heart disease of native coronary artery without angina pectoris: Secondary | ICD-10-CM | POA: Insufficient documentation

## 2017-10-04 DIAGNOSIS — Z794 Long term (current) use of insulin: Secondary | ICD-10-CM | POA: Insufficient documentation

## 2017-10-04 DIAGNOSIS — Z95811 Presence of heart assist device: Secondary | ICD-10-CM

## 2017-10-04 DIAGNOSIS — L89319 Pressure ulcer of right buttock, unspecified stage: Secondary | ICD-10-CM | POA: Insufficient documentation

## 2017-10-04 DIAGNOSIS — L89329 Pressure ulcer of left buttock, unspecified stage: Secondary | ICD-10-CM | POA: Diagnosis not present

## 2017-10-04 DIAGNOSIS — I4729 Other ventricular tachycardia: Secondary | ICD-10-CM

## 2017-10-04 DIAGNOSIS — E1122 Type 2 diabetes mellitus with diabetic chronic kidney disease: Secondary | ICD-10-CM | POA: Insufficient documentation

## 2017-10-04 DIAGNOSIS — R1310 Dysphagia, unspecified: Secondary | ICD-10-CM | POA: Diagnosis not present

## 2017-10-04 DIAGNOSIS — D649 Anemia, unspecified: Secondary | ICD-10-CM | POA: Insufficient documentation

## 2017-10-04 DIAGNOSIS — I255 Ischemic cardiomyopathy: Secondary | ICD-10-CM

## 2017-10-04 LAB — BASIC METABOLIC PANEL
ANION GAP: 11 (ref 5–15)
BUN: 25 mg/dL — ABNORMAL HIGH (ref 8–23)
CHLORIDE: 99 mmol/L (ref 98–111)
CO2: 23 mmol/L (ref 22–32)
Calcium: 9.7 mg/dL (ref 8.9–10.3)
Creatinine, Ser: 1.38 mg/dL — ABNORMAL HIGH (ref 0.61–1.24)
GFR calc Af Amer: 60 mL/min — ABNORMAL LOW (ref >60)
GFR calc non Af Amer: 51 mL/min — ABNORMAL LOW (ref >60)
Glucose, Bld: 103 mg/dL — ABNORMAL HIGH (ref 70–99)
POTASSIUM: 5.5 mmol/L — AB (ref 3.5–5.1)
SODIUM: 133 mmol/L — AB (ref 135–145)

## 2017-10-04 LAB — CBC
HEMATOCRIT: 37.8 % — AB (ref 39.0–52.0)
Hemoglobin: 11.2 g/dL — ABNORMAL LOW (ref 13.0–17.0)
MCH: 24.3 pg — AB (ref 26.0–34.0)
MCHC: 29.6 g/dL — ABNORMAL LOW (ref 30.0–36.0)
MCV: 82.2 fL (ref 78.0–100.0)
Platelets: 235 10*3/uL (ref 150–400)
RBC: 4.6 MIL/uL (ref 4.22–5.81)
RDW: 20.6 % — ABNORMAL HIGH (ref 11.5–15.5)
WBC: 5.1 10*3/uL (ref 4.0–10.5)

## 2017-10-04 LAB — PROTIME-INR
INR: 2.25
Prothrombin Time: 24.7 seconds — ABNORMAL HIGH (ref 11.4–15.2)

## 2017-10-04 LAB — LACTATE DEHYDROGENASE: LDH: 417 U/L — ABNORMAL HIGH (ref 98–192)

## 2017-10-04 MED ORDER — TORSEMIDE 20 MG PO TABS: 20.0000 mg | ORAL_TABLET | ORAL | 0 refills | Status: DC | PRN

## 2017-10-04 MED ORDER — GABAPENTIN 100 MG PO CAPS: 100.0000 mg | ORAL_CAPSULE | Freq: Every day | ORAL | 10 refills | Status: DC

## 2017-10-04 NOTE — Patient Instructions (Addendum)
1. Discontinue santyl. Use Aquacel Ag 4x4 with daily dressing change. 2. Use Allevyn pink foam on right buttock wound.  3. Drive line dressing change on Monday and Thursday.  4. Change Torsamide to as needed.  5. Stop taking Potassium. 6. May take Gabapentin 100mg  at bedtime as needed.  7. Return to [place] clinic for follow up on August 12th at 1100.

## 2017-10-04 NOTE — Progress Notes (Signed)
Advanced Heart Failure Clinic Note   HF: Dr. [name]   HPI:  [name] [name] is a 67 y.o. male with h/o chronic systolic CHF due to ICM, s/p LVAD HM3 08/16/2017, [name], [name], [name], [name], [name], HTN, HL, and CKD III.  Admitted 08/11/17 with recurrent A/C systolic CHF. Milrinone added, but mixed venous saturation remained low. Seen by CT surgery/VAD team and deemed appropriate for HMIII under DT criteria. He underwent HMIII on 6/17. Chest was unable to be closed due to high intrathoracic pressure and RV failure.  He returned to the OR on 6/18 for evacuation of hematoma. Chest was later closed on 6/20. Post operative course complicated by  open chest, RV failure, recurrent VT, respiratory failure, and dysphagia. CCM consulted for vent management with extubation completed on 08/26/17.  As he improved all drips gradually weaned off. Prior to d/c  ramp echo was completed with speed optimized.  Spent 7/11 - 7/27 in CIR. Underwent hydrotherapy for his buttock wound(s). 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   He presents today for his initial post hospital follow up. Overall, he is feeling great. Continues to get stronger everyday. His voice also continues to become more clear. His only complaint today is pain from his buttock wound. Continues to heal but remains sore. [facility] has examined since he's been home. He denies SOB, lightheadedness or dizziness. No CP. He has mild neuropathy in his feet but has been off gabapentin. He has been taking torsemide 40 mg daily, has not needed any extra. Wife has been monitoring his fluid restriction closely. He remains on Dysphagia/ Nectar thick diet. No fevers or chills. Cough improved. No neuro symptoms. No problem with driveline.    Follow up for Heart Failure/LVAD:  VAD Indication: Destination Therapy -  HM3  VAD  interrogation & Equipment Management: Speed: 5300 Flow: 4.5 Power: 3.8 w PI: 3.6  Alarms: No clinical alarms. Events: 70+ PI events.   Fixed speed: 5300 Low speed limit: 500  Primary Controller: Replace back up battery in 31 months. Back up controller: Replace back up battery in 75months.  Annual Equipment Maintenance on UBC/PM was performed on 07/2017.  Denies LVAD alarms.  Denies driveline trauma, erythema or drainage.  Denies ICD shocks. Reports taking Coumadin as prescribed and adherence to anticoagulation based dietary restrictions.  Denies bright red blood per rectum or melena, no dark urine or hematuria.    [place] 08/13/17 On milrinone 0.375 mcg/kg/min RA = 12 RV = 48/13 PA = 50/26 (35) PCW = 20 Fick cardiac output/index = 5.1/2.5 PVR = 3.0 Ao sat = 93% PA sat = 56%, 57% PAPI = 2.0 RA/PCWP = 0.60  R/LHC 01/2017  Prox RCA to Mid RCA lesion is 100% stenosed.  Prox RCA lesion is 100% stenosed.  Ost Cx to Prox Cx lesion is 40% stenosed.  Dist Cx lesion is 70% stenosed.  Ost 1st Mrg to 1st Mrg lesion is 40% stenosed.  1st Mrg lesion is 70% stenosed.  Prox LAD lesion is 50% stenosed.  Prox LAD to Mid LAD lesion is 60% stenosed.   Findings: Ao = 99/56 (73) LV = 101/5 RA = 1 RV = 29/1 PA = 23/11 (16) PCW = 6 Fick cardiac output/index = 3.8/1.7 Thermo CO/CI = 4.1/1.9 PVR = 2.6 Ao sat = 99% PA sat = 60%, 61%  Assessment: -3v CAD with  1) LAD diffuse 60% stenosis 2) LCX multiple 40% lesions 3) RCA chronically occluded proximally with L to R colllterals  Past Medical History:  Diagnosis Date  . AICD (automatic cardioverter/defibrillator) present 02/05/2014   Upgrade to Medtronic biventricular ICD, serial number  [id number]   . Atrial flutter ([place]) 04/2012   s/p TEE-EPS+RFCA 04/2012  . CAD (coronary artery disease) 5631,4970 X 2    RCA-T, 70% PL (off CFX), 99% Prox LAD/90% Dist LAD, S/P TAXUS stent x 2  . CHF (congestive heart failure) ([place])    . Chronic anticoagulation   . Chronic systolic heart failure ([place])   . CKD (chronic kidney disease)   . Diabetic retinopathy ([place])   . DM type 2 (diabetes mellitus, type 2) (HCC)    insulin dependent  . HTN (hypertension)   . Hypercholesteremia    ablation  . ICD (implantable cardiac defibrillator) in place   . Ischemic cardiomyopathy March 2015   20-25% 2D   . Nephrolithiasis   . Ventricular tachycardia ([place])     Current Outpatient Medications  Medication Sig Dispense Refill  . ascorbic acid (VITAMIN C) 500 MG tablet Take 1 tablet (500 mg total) by mouth 2 (two) times daily. 100 tablet 0  . aspirin 81 MG chewable tablet Chew 1 tablet (81 mg total) by mouth daily.    . collagenase (SANTYL) ointment Apply topically daily. Apply layer to area, cover with damp dressing. Then cover with bulky dry dressing and tape in place. 30 g 2  . magnesium oxide (MAG-OX) 400 (241.3 Mg) MG tablet Take 1 tablet (400 mg total) by mouth 2 (two) times daily. 60 tablet 0  . Maltodextrin-Xanthan Gum (RESOURCE THICKENUP CLEAR) POWD Take 1 g by mouth as needed. 1 Can 0  . Multiple Vitamin (MULTIVITAMIN WITH MINERALS) TABS tablet Take 1 tablet by mouth daily.    . potassium chloride (K-DUR) 10 MEQ tablet Take 3 tablets (30 mEq total) by mouth 2 (two) times daily. 180 tablet 0  . protein supplement shake (PREMIER PROTEIN) LIQD Take 325 mLs (11 oz total) by mouth 2 (two) times daily between meals.  0  . spironolactone (ALDACTONE) 25 MG tablet Take 1 tablet (25 mg total) by mouth daily. 30 tablet 0  . torsemide (DEMADEX) 20 MG tablet Take 3 tablets (60 mg total) by mouth daily. 90 tablet 0  . warfarin (COUMADIN) 2.5 MG tablet Take 1 tablet (2.5 mg total) by mouth daily at 6 PM. 30 tablet 0  . bisacodyl (DULCOLAX) 5 MG EC tablet Take 1-2 tablets (5-10 mg total) by mouth daily as needed for moderate constipation. (Patient not taking: Reported on 10/04/2017) 60 tablet 0  . clonazePAM (KLONOPIN) 0.25 MG disintegrating  tablet Take 1 tablet (0.25 mg total) by mouth 2 (two) times daily as needed (anxiety). (Patient not taking: Reported on 09/29/2017) 60 tablet 0  . doxycycline (VIBRA-TABS) 100 MG tablet Take 1 tablet (100 mg total) by mouth every 12 (twelve) hours. (Patient not taking: Reported on 10/04/2017) 12 tablet 0  . famotidine (PEPCID) 20 MG tablet Take 1 tablet (20 mg total) by mouth 2 (two) times daily. (Patient not taking: Reported on 09/29/2017) 60 tablet 0  . Insulin Detemir (LEVEMIR FLEXTOUCH) 100 UNIT/ML Pen Inject 25 Units into the skin at bedtime. (Patient not taking: Reported on 10/04/2017) 15 mL 11  . Insulin Pen Needle (COMFORT EZ PEN NEEDLES) 32G X 6 MM MISC 1 application by Does not apply route at bedtime. 100 each 0  .  nystatin (MYCOSTATIN) 100000 UNIT/ML suspension Take 5 mLs (500,000 Units total) by mouth 4 (four) times daily. (Patient not taking: Reported on 10/04/2017) 60 mL 0  . traZODone (DESYREL) 50 MG tablet Take 1 tablet (50 mg total) by mouth at bedtime. (Patient not taking: Reported on 09/29/2017) 30 tablet 0   No current facility-administered medications for this encounter.     Penicillins  Review of systems complete and found to be negative unless listed in HPI.    Vitals:   10/04/17 1108 10/04/17 1110  BP: (!) 74/0 (!) 99/44  Pulse:  (!) 57  Weight: 208 lb 6.4 oz (94.5 kg)     Wt Readings from Last 3 Encounters:  10/04/17 208 lb 6.4 oz (94.5 kg)  10/01/17 203 lb 9.6 oz (92.4 kg)  09/25/17 213 lb 13.5 oz (97 kg)    Vital Signs:  Doppler Pressure: 74 Automatc BP: 99/44 (68) HR: 57 SPO2: UTO  Weight: 208 lb w/o eqt Last weight: 213 lbs (discharge weight) Home weights: ~ 200 lbs   Vitals:   10/04/17 1108 10/04/17 1110  BP: (!) 74/0 (!) 99/44  Pulse:  (!) 57  Weight: 208 lb 6.4 oz (94.5 kg)     Physical Exam: General: Well appearing this am. NAD. Mildly hoarse  HEENT: Normal. Anicteric  Neck: Supple, JVP 7-8 cm. Carotids OK.  Cardiac:  Mechanical heart sounds  with LVAD hum present.  Lungs:  CTAB, normal effort.  Abdomen:  NT, ND, no HSM. No bruits or masses. +BS  LVAD exit site: Dressing dry and intact. No erythema or drainage. Stabilization device present and accurately applied. Driveline dressing changed daily per sterile technique. Extremities:  Warm and dry. No cyanosis, clubbing, rash, or edema. Dressing on buttock - changed in clinic Neuro: alert & oriented x 3, cranial nerves grossly intact. moves all 4 extremities w/o difficulty. Affect pleasant     ASSESSMENT AND PLAN:  1. Chronic systolic CHF with biventricular failure-> cardiogenic shock: - Echo 08/13/2017 EF 20-25%.s/p Medtronic BiV ICD in place. Cath 12/18 with stable 1v CAD. s/p HM-3 implant 08/16/17.  - NYHA II-III symptoms on VAD support.  - Volume status stable to dry on exam.  - Cr 1.0 -> 1.3.  - Will change torsemide to 20 mg as needed only for now.  - Stop K supp with elevated K - Continue spiro 25 mg daily.  - Continue digoxin 0.125 mg daily.   2. 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 - On aspirin and coumadin. No bleeding  - INR goal 2-2.5 INR 2.25. Discussed dosing with PharmD personally. - Ramp ECHO completed 09/09/2017 with speed increased to 5300.  - VAD interrogated personally. Parameters stable.   3. AKI on CKD III: - Torsemide changed to prn. Recheck Thursday.  4. [name]/VF:  - Quiescent. He is off amio.  - Recurrent VT on 6/22, VF 6/24, and VT 6/26 requiring emergent bedside cardioversion.  5. AFL/atrial fibrillation:  - S/p previous ablation. He is currently in atrial fibrillation.  -Rate controlled. Off amio. On coumadin as above.  6. CAD s/p PCI of RCA and KYH:CWCBJS cath with stable CAD as above:  - No s/s of ischemia.   - Continue ASA and statin.  7. [name]: Recent A1c8.3 on 6/3. - Per PCP.  8. Anemia: Worse, post op.  - Hgb 11.2 today.  9. Deconditioning - Getting HH PT after complete CIR.  10. Dysphagia:  - Progressed. Remains on nectar thick diet.    11. Buttock Pressure Ulcer:R  and L buttock.  - Improving. Assessed in clinic. Will adjust dressings per VAD coordinator note.   - Had been getting hydrotherapy in hospital.  12. 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 - Consider ENT f/u as outpatient ? Vocal cord medialization   - Remains on Dys 2/Honey diet.  12. Rectal Mass - CT reviewed with Radiology. 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 Site stable.   Change torsemide to 20 mg prn. Stop K supp. OK to resume gabapentin prn for neuropathy, can take up to 300 mg TID (PTA dosing)  Shirley Friar, PA-C  10/04/17   Patient seen and examined with the above-signed Advanced Practice Provider and/or Housestaff. I personally reviewed laboratory data, imaging studies and relevant notes. I independently examined the patient and formulated the important aspects of the plan. I have edited the note to reflect any of my changes or salient points. I have personally discussed the plan with the patient and/or family.  Doing very well s/p recent VAD placement. Getting stronger with PT/OT. Volume status looks good. Change torsemide as above. VAD interrogated personally. Parameters stable. INR ok. Discussed dosing with PharmD personally. No bleeding. Buttock wound reviewed with Wound Care. Dressing changed in clinic. Wound base looks good. Rhythm stable - off amio. VAD interrogated personally. Parameters stable. Restart gabapentin for neuropathy.   Glori Bickers, MD  12:49 PM

## 2017-10-04 NOTE — Progress Notes (Signed)
Patient presents for 1 week hospital follow up in [facility] today. Reports no problems with VAD equipment or concerns with drive line. He says his only complaint is that his "butt is sore."    Vital Signs:  Doppler Pressure: 74 Automatc BP: 99/44 (68) HR:  57 SPO2:  %  Weight: 208.4 lb w/ eqt Last weight: 202lb w/o eqip Home weights: 200-202 lbs   VAD Indication: Destination Therapy- Implanted 08/16/17   LVAD assessment: HM III:  Primary Controller  VAD Speed: 5300 rpms Flow: 4.5 Power: 3.6 w    PI: 3.6 Alarms: no clinical alarms  Events: 70+ PI events  Fixed speed: 5300 Low speed limit: 5000 Back up battery due for replacement in  31 months.   I reviewed the LVAD parameters from today and compared the results to the patient's prior recorded data. LVAD interrogation was NEGATIVE for significant power changes, NEGATIVE for clinical alarms and MULTIPLE for PI events/speed drops. Hematocrit was updated, but no other changes were made and pump is functioning within specified parameters. Pt is performing daily controller and system monitor self tests along with completing weekly and monthly maintenance for LVAD equipment.  LVAD equipment check completed and is in good working order. Back-up equipment present. Performed self-test on equipment.   Annual Equipment Maintenance on UBC/PM was performed on 09/17/17.   Exit Site Care:  VAD dressing and anchor removed and site care performed using sterile technique. Drive line exit site cleaned with Chlora prep applicators x 2, allowed to dry, skin protectant applied and allowed to dry before gauze dressing and Aquacel silver strip re-applied. Exit site healing, and partially incorporated.The velour is exposed 1/2 inch at exit site. No redness, tenderness, or foul odor noted. Small amount of serous fluid noted on gauze. Drive line anchor re-applied. Pt denies fever or chills. Will change dressing change frequency to twice a week.            Buttock Wound Care: [name] is doing daily dressing changes on his buttock wounds. She says she is applying santyl to the wound bed and changing the gauze daily. Per Amy NP, will discontinue the santyl. Will begin using Aquacel Ag sheets in place of santyl. Educated [place] on new daily dressing change procedure. (Cleanse site with saline. Allow to dry. Apply 4x4 Aquacel to site. Cover with dry gauze/ABD pad.) She verbalized understanding.        Significant Events on VAD Support:    Device: Medtronic 3 Lead Therapies: on Last check: 10/17/16  Labs:   Hgb 11.2 - No S/S of bleeding. Specifically denies melena/BRBPR or nosebleeds.  Hct- 37.8  LDH stable at 417. Denies tea-colored urine. No power elevations noted on interrogation.   INR- 2.25  Plan  1. Discontinue santyl. Use Aquacel Ag 4x4 with daily dressing change. 2. Use Allevyn pink foam on right buttock wound.  3. Drive line dressing change on Monday and Thursday.  4. Change Torsamide to as needed.  5. Stop taking Potassium. 6. May take Gabapentin 100mg  at bedtime as needed.  7. Return to [facility] for follow up on August 12th at 1100.   Emerson Monte RN [place] Coordinator   Office: [phone] 24/7 Emergency VAD Pager: [phone]

## 2017-10-05 ENCOUNTER — Telehealth: Payer: Self-pay

## 2017-10-05 DIAGNOSIS — L89310 Pressure ulcer of right buttock, unstageable: Secondary | ICD-10-CM | POA: Diagnosis not present

## 2017-10-05 DIAGNOSIS — I5043 Acute on chronic combined systolic (congestive) and diastolic (congestive) heart failure: Secondary | ICD-10-CM | POA: Diagnosis not present

## 2017-10-05 DIAGNOSIS — I5023 Acute on chronic systolic (congestive) heart failure: Secondary | ICD-10-CM | POA: Diagnosis not present

## 2017-10-05 DIAGNOSIS — E1121 Type 2 diabetes mellitus with diabetic nephropathy: Secondary | ICD-10-CM | POA: Diagnosis not present

## 2017-10-05 DIAGNOSIS — I252 Old myocardial infarction: Secondary | ICD-10-CM | POA: Diagnosis not present

## 2017-10-05 NOTE — Telephone Encounter (Signed)
LMOVM reminding pt to send remote transmission.   

## 2017-10-07 ENCOUNTER — Ambulatory Visit: Payer: Self-pay | Admitting: *Deleted

## 2017-10-07 ENCOUNTER — Ambulatory Visit (HOSPITAL_COMMUNITY): Payer: Self-pay | Admitting: Pharmacist

## 2017-10-07 ENCOUNTER — Ambulatory Visit (HOSPITAL_COMMUNITY)
Admission: RE | Admit: 2017-10-07 | Discharge: 2017-10-07 | Disposition: A | Discharge status: Home / Self Care | Payer: PPO | Source: Ambulatory Visit | Attending: Internal Medicine | Admitting: Internal Medicine

## 2017-10-07 DIAGNOSIS — Z7901 Long term (current) use of anticoagulants: Secondary | ICD-10-CM | POA: Diagnosis not present

## 2017-10-07 DIAGNOSIS — Z95811 Presence of heart assist device: Secondary | ICD-10-CM

## 2017-10-07 LAB — PROTIME-INR
INR: 2.57
Prothrombin Time: 27.4 seconds — ABNORMAL HIGH (ref 11.4–15.2)

## 2017-10-07 LAB — BASIC METABOLIC PANEL
ANION GAP: 7 (ref 5–15)
BUN: 20 mg/dL (ref 8–23)
CHLORIDE: 102 mmol/L (ref 98–111)
CO2: 24 mmol/L (ref 22–32)
Calcium: 9.1 mg/dL (ref 8.9–10.3)
Creatinine, Ser: 1.11 mg/dL (ref 0.61–1.24)
GFR calc Af Amer: >60 mL/min (ref >60)
GFR calc non Af Amer: >60 mL/min (ref >60)
GLUCOSE: 143 mg/dL — AB (ref 70–99)
POTASSIUM: 4.8 mmol/L (ref 3.5–5.1)
Sodium: 133 mmol/L — ABNORMAL LOW (ref 135–145)

## 2017-10-07 LAB — LACTATE DEHYDROGENASE: LDH: 301 U/L — ABNORMAL HIGH (ref 98–192)

## 2017-10-07 NOTE — Progress Notes (Signed)
EPIC Encounter for ICM Monitoring  Patient Name: [name] [name] is a 67 y.o. male Date: 10/07/2017 Primary Care Physican: [name], NP Primary Cardiologist:[name] Electrophysiologist: Druscilla Brownie Weight: 919TYO (office weight 10/04/17)  Clinical Status (24-Sep-2017 to 05-Oct-2017)  Treated VT/VF 0 episodes  AT/AF 2 episodes   Time in AT/AF 24.0 hr/day (100.0%)  Observations (3) (24-Sep-2017 to 05-Oct-2017)  AT/AF >= 6 hr for 12 days.  V. Pacing less than 90%.  Patient Activity less than 1 hr/day for 1 weeks.       Heart Failure questions reviewed, pt asymptomatic.  He is having labs drawn today.  He reported he is trying to regain strength after long hospitalization.  He has LVAD in place   Thoracic impedance normal.  Prescribed dosage: Torsemide 20 mgtake 1 tablet by mouth as needed.  Labs: 10/04/2017 Creatinine 1.38, BUN 25, Potassium 5.5, Sodium 133, EGFR 51-60 09/23/2017 Creatinine 1.02, BUN 15, Potassium 3.9, Sodium 133, EGFR >60 See Results review for more labs drawn between 08/11/17 and 09/20/17 08/02/2017 Creatinine 1.24, BUN 22, Potassium 4.3, Sodium 139, EGFR 58->60 07/22/2017 Creatinine 1.33, BUN 29, Potassium 4.5, Sodium 141, EGFR 54->60 06/24/2017 Creatinine1.23, BUN26, Potassium4.0, Sodium136, EGFR59->60 05/24/2017 Creatinine0.98, BUN20, Potassium3.8, Sodium136, EGFR>60  04/21/2017 Creatinine1.09, BUN25, Potassium3.9, Sodium139, EGFR>60  04/09/2017 Creatinine1.52, BUN30, Potassium3.8, Sodium136, [id number]  03/29/2017 Creatinine1.01, BUN21, Potassium4.1, [id number] 03/19/2017 Creatinine1.34, BUN30, Potassium5.2, Sodium137, EGFR53->60 02/17/2017 Creatinine1.44, BUN22, Potassium4.5, [id number], [id number]  02/05/2017 Creatinine1.37, BUN29, Potassium4.9, Sodium134, EGFR52->60  01/27/2017 Creatinine1.47, BUN44, Potassium4.7, Sodium132, [id number]  01/15/2017 Creatinine1.13, BUN25, Potassium4.7,  Sodium131, EGFR>60 A complete set of results can be found in results review.  Recommendations: No changes.   Encouraged to call for fluid symptoms.  Follow-up plan: ICM clinic phone appointment on 11/04/2017.    Copy of ICM check sent to Dr. [name].   3 month ICM trend: 10/05/2017    1 Year ICM trend:       [name], RN 10/07/2017 12:56 PM

## 2017-10-08 ENCOUNTER — Other Ambulatory Visit: Payer: Self-pay | Admitting: *Deleted

## 2017-10-08 NOTE — Patient Outreach (Signed)
[name] [name]) Care Management  10/08/2017  [name] [name] 1950/06/09 [id number]   Transition of Care Referral-/care coordination 2 week follow up call with preparation for case closure   Referral Date:09/28/17 Referral Source:UM referral - HTA referral Date of Admission:09/10/17 Diagnosis:s/p LVAD Date of Discharge:09/25/17 Facility:[facility] stay for 45 days per Mrs [name] [name] Insurance:health team advantage  Successful contact received from Mr & Mrs [name] [name] (they returned a call to [name]) Mrs & Mr [name] [name] states the sacral wound is healing with pink tissue (only had one incident this week with increase activity and a need for an extra dressing change related to increase drainage of clear secretions),  Insulin 70/30 continues to keep cbg less than 148 and ointment use to sacrum is not a problem as using less with healing of wound  last Wt when Cm spoke with him was 204 lbs, Today 10/08/17 =204.4 lbs but had decreased to 200 lbs in last week He had nutritional drinks with improvement  bp today sbp 78-94  CM discussed safety precautions related to hypotension Encouraged him to report dizziness/lightheaded related to low BP Mr [name] [name] denies experiencing these s/s with low bp reading   Ambulation Mr [name] [name] is walking with a cane only now with [facility] PT which is an improvement in mobility for use of a walker    Social:Mr [name] [name] lives at home with his wife as primary caregiver Presently he is independent/assist with his care.Mrs [name] [name] states she assists by minimally to get private areas and to complete errands and transportation.He is using thick 1 scoop vs 2 scoops and will have a speech therapist arrive soon. Has already had advanced home care services for RN and PT    Conditions:chronic anticoagulation, chronic systolic CHF, presence of left ventricular assist device, hx of Afib, CAS stage 3 ckd, acute blood loss anemia, dysphagia, DM type 2 sleep disturbance, rectal mass, irregular  prostate, stage 2 sacral decubitus  Medications:theydenied concerns with taking medications as prescribed, affording medications, side effects of medications and questions about medications.   Appointments:Mrs [name] [name] has been able to get Mr [name] [name] to appointments without issues on 10/01/17 to GI, 10/04/16 to cardiology  Scheduled to be seen on 10/15/17 at primary MD  Advance directives:He denies need for assist withadvance directives   Plan: [name] RN CM will close case at this time as patient has been assessed and no needs identified.   Cm reminded them of CM contact information and encouraged a call if any [facility] services needed  [name] L. Lavina Hamman, RN, BSN, CCM [name] [name] Telephonic Care Management Care Coordinator Direct number [phone]  Main [facility] number [phone] Fax number [phone]

## 2017-10-08 NOTE — Patient Outreach (Signed)
[facility]) Care Management  10/08/2017  [name] [name] 1950/08/10 [id number]   Transition of Care Referral  Referral Date:  09/28/17 Referral Source: UM referral - HTA referral  Date of Admission: 09/10/17 Diagnosis: s/p LVAD Date of Discharge: 09/25/17 Facility: [facility] stay for 45 days per [name] [name]: health team advantage  Outreach attempt # 1  Unsuccessful contact made at the home number listed in Epic Cm called to follow on [name] [name] sacral wound, insulin and ointment use last Wt today was 204 lbs, on 06/03/17 he was 230 lbs at 5'10" Loss of 26 lbs   Social: [name] [name] lives at home with his wife as primary caregiver Presently he is independent/assist with his care. [name] [name] states she assists by minimally to get private areas and to complete errands and transportation. He is using thick 1 scoop vs 2 scoops and will have a speech therapist arrive soon. Has already had advanced home care services for RN and PT today   Conditions: chronic anticoagulation, chronic systolic CHF, presence of left ventricular assist device, hx of afib, CAS stage 3 ckd, acute blood loss anemia, dysphagia, DM type 2 sleep disturbance, rectal mass, irregular prostate, stage 2 sacral decubitis  Medications: they denied concerns with taking medications as prescribed, affording medications, side effects of medications and questions about medications.   Appointments: [name] [name] has been able to get [name] [name] to appointments without issues on 10/01/17 to GI, 10/04/16 to cardiology  Scheduled to be seen no 10/15/17 to primary MD  Advance directives: He denies need for assist with advance directives    Plan: [facility] RN CM will follow up with [name] [name] again within 7-14 business days and plan for case closure   Melonie Germani L. Lavina Hamman, RN, BSN, CCM [facility] Telephonic Care Management Care Coordinator Direct number [phone]  Main [facility] number [phone] Fax number [phone]

## 2017-10-11 ENCOUNTER — Ambulatory Visit: Payer: Self-pay | Admitting: *Deleted

## 2017-10-12 ENCOUNTER — Ambulatory Visit (HOSPITAL_COMMUNITY)
Admission: RE | Admit: 2017-10-12 | Discharge: 2017-10-12 | Disposition: A | Discharge status: Home / Self Care | Payer: PPO | Source: Ambulatory Visit | Attending: Internal Medicine | Admitting: Internal Medicine

## 2017-10-12 ENCOUNTER — Ambulatory Visit (HOSPITAL_COMMUNITY): Payer: Self-pay | Admitting: Pharmacist

## 2017-10-12 ENCOUNTER — Other Ambulatory Visit (HOSPITAL_COMMUNITY): Payer: Self-pay | Admitting: *Deleted

## 2017-10-12 VITALS — BP 109/51 | Ht 70.0 in | Wt 222.0 lb

## 2017-10-12 DIAGNOSIS — Z95810 Presence of automatic (implantable) cardiac defibrillator: Secondary | ICD-10-CM | POA: Diagnosis not present

## 2017-10-12 DIAGNOSIS — Z7901 Long term (current) use of anticoagulants: Secondary | ICD-10-CM | POA: Diagnosis not present

## 2017-10-12 DIAGNOSIS — R1310 Dysphagia, unspecified: Secondary | ICD-10-CM | POA: Diagnosis not present

## 2017-10-12 DIAGNOSIS — E1122 Type 2 diabetes mellitus with diabetic chronic kidney disease: Secondary | ICD-10-CM | POA: Diagnosis not present

## 2017-10-12 DIAGNOSIS — E7800 Pure hypercholesterolemia, unspecified: Secondary | ICD-10-CM | POA: Diagnosis not present

## 2017-10-12 DIAGNOSIS — I4891 Unspecified atrial fibrillation: Secondary | ICD-10-CM | POA: Insufficient documentation

## 2017-10-12 DIAGNOSIS — E11319 Type 2 diabetes mellitus with unspecified diabetic retinopathy without macular edema: Secondary | ICD-10-CM | POA: Diagnosis not present

## 2017-10-12 DIAGNOSIS — I130 Hypertensive heart and chronic kidney disease with heart failure and stage 1 through stage 4 chronic kidney disease, or unspecified chronic kidney disease: Secondary | ICD-10-CM | POA: Diagnosis not present

## 2017-10-12 DIAGNOSIS — Z95811 Presence of heart assist device: Secondary | ICD-10-CM | POA: Diagnosis not present

## 2017-10-12 DIAGNOSIS — N183 Chronic kidney disease, stage 3 (moderate): Secondary | ICD-10-CM | POA: Diagnosis not present

## 2017-10-12 DIAGNOSIS — Z79899 Other long term (current) drug therapy: Secondary | ICD-10-CM | POA: Diagnosis not present

## 2017-10-12 DIAGNOSIS — Z794 Long term (current) use of insulin: Secondary | ICD-10-CM | POA: Insufficient documentation

## 2017-10-12 DIAGNOSIS — I5043 Acute on chronic combined systolic (congestive) and diastolic (congestive) heart failure: Secondary | ICD-10-CM

## 2017-10-12 DIAGNOSIS — R490 Dysphonia: Secondary | ICD-10-CM | POA: Insufficient documentation

## 2017-10-12 DIAGNOSIS — Z955 Presence of coronary angioplasty implant and graft: Secondary | ICD-10-CM | POA: Insufficient documentation

## 2017-10-12 DIAGNOSIS — I472 Ventricular tachycardia, unspecified: Secondary | ICD-10-CM

## 2017-10-12 DIAGNOSIS — I4892 Unspecified atrial flutter: Secondary | ICD-10-CM | POA: Diagnosis not present

## 2017-10-12 DIAGNOSIS — L89329 Pressure ulcer of left buttock, unspecified stage: Secondary | ICD-10-CM | POA: Insufficient documentation

## 2017-10-12 DIAGNOSIS — I2510 Atherosclerotic heart disease of native coronary artery without angina pectoris: Secondary | ICD-10-CM | POA: Diagnosis not present

## 2017-10-12 DIAGNOSIS — I5022 Chronic systolic (congestive) heart failure: Secondary | ICD-10-CM

## 2017-10-12 DIAGNOSIS — D649 Anemia, unspecified: Secondary | ICD-10-CM | POA: Diagnosis not present

## 2017-10-12 DIAGNOSIS — Z7982 Long term (current) use of aspirin: Secondary | ICD-10-CM | POA: Diagnosis not present

## 2017-10-12 DIAGNOSIS — I255 Ischemic cardiomyopathy: Secondary | ICD-10-CM | POA: Diagnosis not present

## 2017-10-12 DIAGNOSIS — I4729 Other ventricular tachycardia: Secondary | ICD-10-CM

## 2017-10-12 LAB — BASIC METABOLIC PANEL
Anion gap: 7 (ref 5–15)
BUN: 19 mg/dL (ref 8–23)
CO2: 25 mmol/L (ref 22–32)
CREATININE: 1.09 mg/dL (ref 0.61–1.24)
Calcium: 9.2 mg/dL (ref 8.9–10.3)
Chloride: 104 mmol/L (ref 98–111)
Glucose, Bld: 155 mg/dL — ABNORMAL HIGH (ref 70–99)
Potassium: 4 mmol/L (ref 3.5–5.1)
Sodium: 136 mmol/L (ref 135–145)

## 2017-10-12 LAB — CBC
HCT: 35.7 % — ABNORMAL LOW (ref 39.0–52.0)
Hemoglobin: 10.6 g/dL — ABNORMAL LOW (ref 13.0–17.0)
MCH: 24.6 pg — ABNORMAL LOW (ref 26.0–34.0)
MCHC: 29.7 g/dL — ABNORMAL LOW (ref 30.0–36.0)
MCV: 82.8 fL (ref 78.0–100.0)
PLATELETS: 204 10*3/uL (ref 150–400)
RBC: 4.31 MIL/uL (ref 4.22–5.81)
RDW: 20.3 % — AB (ref 11.5–15.5)
WBC: 4.3 10*3/uL (ref 4.0–10.5)

## 2017-10-12 LAB — PROTIME-INR
INR: 2.09
Prothrombin Time: 23.3 seconds — ABNORMAL HIGH (ref 11.4–15.2)

## 2017-10-12 LAB — LACTATE DEHYDROGENASE: LDH: 238 U/L — AB (ref 98–192)

## 2017-10-12 MED ORDER — TORSEMIDE 20 MG PO TABS: 40.0000 mg | ORAL_TABLET | ORAL | 0 refills | Status: DC

## 2017-10-12 MED ORDER — POTASSIUM CHLORIDE ER 10 MEQ PO TBCR: 10.0000 meq | EXTENDED_RELEASE_TABLET | ORAL | 3 refills | Status: DC

## 2017-10-12 NOTE — Progress Notes (Signed)
VAD Clinic Note   HF: Dr. [name]   HPI: [name] [name] is a 66 y.o. male with h/o chronic systolic CHF due to ICM, s/p LVAD HM3 08/16/2017, [name], [name], [name], [name], [name], HTN, HL, and CKD III.  Admitted 08/11/17 with recurrent A/C systolic CHF. Milrinone added, but mixed venous saturation remained low. Seen by CT surgery/VAD team and deemed appropriate for HMIII under DT criteria. He underwent HMIII on 6/17. Chest was unable to be closed due to high intrathoracic pressure and RV failure.  He returned to the OR on 6/18 for evacuation of hematoma. Chest was later closed on 6/20. Post operative course complicated by  open chest, RV failure, recurrent VT, respiratory failure, and dysphagia. CCM consulted for vent management with extubation completed on 08/26/17.  As he improved all drips gradually weaned off. Prior to d/c  ramp echo was completed with speed optimized.  Spent 7/11 - 7/27 in CIR. Underwent hydrotherapy for his buttock wound(s). 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   Today he returns for weekly VAD follow up with his wife. Buttock pain continues to improve. Overall feeling much better. Able to do more and more. Does ADLs easily. Denies SOB/PND/Orthopnea. Appetite improving over the last week. Has fried fish over the weekend. No fever or chills. Weight at home went up 8 pounds so his wife gave him 40 mg torsemide on Sunday and Monday. Leg edema went down.  No bleeding problems. Taking all medications. Continues to work with [place].   Follow up for Heart Failure/LVAD:  VAD Indication: Destination Therapy -  HM3  VAD interrogation & Equipment Management: Speed: 5300 Flow: 3.6  Power: 3.7 PI: 4.7   Alarms: No clinical alarms. Events: 70+ PI events.   Fixed speed: 5300 Low speed limit: 500  Primary Controller: Replace back up  battery in 31 months. Back up controller: Replace back up battery in 51months.  Annual Equipment Maintenance on UBC/PM was performed on 07/2017.  Denies LVAD alarms.  Denies driveline trauma, erythema or drainage.  Denies ICD shocks. Reports taking Coumadin as prescribed and adherence to anticoagulation based dietary restrictions.  Denies bright red blood per rectum or melena, no dark urine or hematuria.    [place] 08/13/17 On milrinone 0.375 mcg/kg/min RA = 12 RV = 48/13 PA = 50/26 (35) PCW = 20 Fick cardiac output/index = 5.1/2.5 PVR = 3.0 Ao sat = 93% PA sat = 56%, 57% PAPI = 2.0 RA/PCWP = 0.60  R/LHC 01/2017  Prox RCA to Mid RCA lesion is 100% stenosed.  Prox RCA lesion is 100% stenosed.  Ost Cx to Prox Cx lesion is 40% stenosed.  Dist Cx lesion is 70% stenosed.  Ost 1st Mrg to 1st Mrg lesion is 40% stenosed.  1st Mrg lesion is 70% stenosed.  Prox LAD lesion is 50% stenosed.  Prox LAD to Mid LAD lesion is 60% stenosed.   Findings: Ao = 99/56 (73) LV = 101/5 RA = 1 RV = 29/1 PA = 23/11 (16) PCW = 6 Fick cardiac output/index = 3.8/1.7 Thermo CO/CI = 4.1/1.9 PVR = 2.6 Ao sat = 99% PA sat = 60%, 61%  Assessment: -3v CAD with 1) LAD diffuse 60% stenosis 2) LCX multiple 40% lesions 3) RCA chronically occluded proximally with L to R colllterals  Past Medical History:  Diagnosis Date  . AICD (automatic cardioverter/defibrillator) present  02/05/2014   Upgrade to Medtronic biventricular ICD, serial number  [id number]   . Atrial flutter ([place]) 04/2012   s/p TEE-EPS+RFCA 04/2012  . CAD (coronary artery disease) 6503,5465 X 2    RCA-T, 70% PL (off CFX), 99% Prox LAD/90% Dist LAD, S/P TAXUS stent x 2  . CHF (congestive heart failure) ([place])   . Chronic anticoagulation   . Chronic systolic heart failure ([place])   . CKD (chronic kidney disease)   . Diabetic retinopathy ([place])   . DM type 2 (diabetes mellitus, type 2) (H[facility])    insulin dependent  . HTN  (hypertension)   . Hypercholesteremia    ablation  . ICD (implantable cardiac defibrillator) in place   . Ischemic cardiomyopathy March 2015   20-25% 2D   . Nephrolithiasis   . Ventricular tachycardia ([place])     Current Outpatient Medications  Medication Sig Dispense Refill  . ascorbic acid (VITAMIN C) 500 MG tablet Take 1 tablet (500 mg total) by mouth 2 (two) times daily. 100 tablet 0  . aspirin 81 MG chewable tablet Chew 1 tablet (81 mg total) by mouth daily.    Marland Kitchen gabapentin (NEURONTIN) 100 MG capsule Take 1 capsule (100 mg total) by mouth at bedtime. 90 capsule 10  . Insulin Detemir (LEVEMIR FLEXTOUCH) 100 UNIT/ML Pen Inject 25 Units into the skin at bedtime. 15 mL 11  . Insulin Pen Needle (COMFORT EZ PEN NEEDLES) 32G X 6 MM MISC 1 application by Does not apply route at bedtime. 100 each 0  . magnesium oxide (MAG-OX) 400 (241.3 Mg) MG tablet Take 1 tablet (400 mg total) by mouth 2 (two) times daily. 60 tablet 0  . Maltodextrin-Xanthan Gum (RESOURCE THICKENUP CLEAR) POWD Take 1 g by mouth as needed. 1 Can 0  . Multiple Vitamin (MULTIVITAMIN WITH MINERALS) TABS tablet Take 1 tablet by mouth daily.    . protein supplement shake (PREMIER PROTEIN) LIQD Take 325 mLs (11 oz total) by mouth 2 (two) times daily between meals.  0  . spironolactone (ALDACTONE) 25 MG tablet Take 1 tablet (25 mg total) by mouth daily. 30 tablet 0  . [START ON 10/13/2017] torsemide (DEMADEX) 20 MG tablet Take 2 tablets (40 mg total) by mouth every Monday, Wednesday, and Friday. 90 tablet 0  . warfarin (COUMADIN) 2.5 MG tablet Take 1 tablet (2.5 mg total) by mouth daily at 6 PM. 30 tablet 0  . collagenase (SANTYL) ointment Apply topically daily. Apply layer to area, cover with damp dressing. Then cover with bulky dry dressing and tape in place. (Patient not taking: Reported on 10/12/2017) 30 g 2  . [START ON 10/13/2017] potassium chloride (K-DUR) 10 MEQ tablet Take 1 tablet (10 mEq total) by mouth every Monday, Wednesday,  and Friday. 90 tablet 3   No current facility-administered medications for this encounter.     Penicillins  Review of systems complete and found to be negative unless listed in HPI.    Vitals:   10/12/17 1103 10/12/17 1112  BP: (!) 82/0 (!) 109/51  Weight: 100.7 kg (222 lb)   Height: 5\' 10"  (1.778 m)     Wt Readings from Last 3 Encounters:  10/12/17 100.7 kg (222 lb)  10/04/17 94.5 kg (208 lb 6.4 oz)  10/01/17 92.4 kg (203 lb 9.6 oz)    Vital Signs:  Doppler Pressure: 82 Automatc BP: 109/51 (79)  HR: 86%  SPO2: UTO  Weight: 222 lb w/o eqt Last weight: 202 lbs (discharge weight) Home weights: trending  up.   Vitals:   10/12/17 1103 10/12/17 1112  BP: (!) 82/0 (!) 109/51  Weight: 100.7 kg (222 lb)   Height: 5\' 10"  (1.778 m)     Physical Exam: GENERAL: Well appearing, male who presents to clinic today in no acute distress. Wife present.  HEENT: normal  NECK: Supple, JVP 5-6   .  2+ bilaterally, no bruits.  No lymphadenopathy or thyromegaly appreciated.   CARDIAC:  Mechanical heart sounds with LVAD hum present.  LUNGS:  Clear to auscultation bilaterally.  ABDOMEN:  Soft, round, nontender, positive bowel sounds x4.     LVAD exit site:  Dressing dry and intact.  No erythema or drainage.  Stabilization device present and accurately applied.  Driveline dressing is being changed daily per sterile technique. EXTREMITIES:  Warm and dry, no cyanosis, clubbing, rash or edema  NEUROLOGIC:  Alert and oriented x 4.  Gait steady.  No aphasia.  No dysarthria.  Affect pleasant. Skin: R buttock 100% granulation.    L buttock scars noted.     ASSESSMENT AND PLAN:  1. Chronic systolic CHF with biventricular failure-> cardiogenic shock: - Echo 08/13/2017 EF 20-25%.s/p Medtronic BiV ICD in place. Cath 12/18 with stable 1v CAD. s/p HM-3 implant 08/16/17.  - NYHA II symptoms with VAD - Volume status stable. Will start torsemide 40 mg M/W/F and can hold if weight comes down. -  Continue spiro 25 mg daily.  - Continue digoxin 0.125 mg daily.   2. 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 - On aspirin and coumadin. No bleeding  - INR goal 2-2.5 INR 2.1 . Discussed dosing with PharmD personally. - Ramp ECHO completed 09/09/2017 with speed increased to 5300.  - VAD interrogated personally. Parameters stable.   3. CKD III: - Creatinine back to baseline. Todays creatinine 1.1  4. [name]/VF:  - Quiescent. He is off amio.  - Recurrent VT on 6/22, VF 6/24, and VT 6/26 requiring emergent bedside cardioversion.  5. AFL/atrial fibrillation:  - S/p previous ablation. He is currently in atrial fibrillation.  -Rate controlled. Off amio. On coumadin as above.  6. CAD s/p PCI of RCA and IHK:VQQVZD cath with stable CAD as above:  - No s/s of ischemia.   - Continue ASA and statin.  7. [name]: Recent A1c8.3 on 6/3. - Per PCP.  8. Anemia:  Hgb 10.6  9. Deconditioning Continue HH PT  10. Dysphagia:  - Progressed. Remains on nectar thick diet.   11. Buttock Pressure Ulcer:L buttock wound healed.  R buttock 100% granulation and hypergranulation noted. Applied silver nitrate. Cut back Aqucell Ag to every other day.  Looks great.  12. 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 - Consider ENT f/u as outpatient ? Vocal cord medialization   -Resolved.  12. Rectal Mass - CT reviewed with Radiology. 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 Driveline starting to incorporate.    Darrick Grinder, NP  10/12/17    Patient seen and examined with the above-signed Advanced Practice Provider and/or Housestaff. I personally reviewed laboratory data, imaging studies and relevant notes. I independently examined the patient and formulated the important aspects of the plan. I have edited the note to reflect any of my changes or salient points. I have personally discussed the plan with the patient  and/or family.  Continues to get stronger after VAD implant. Volume status was elevated but improved with  restarting torsemide. Will have him take torsemide 40mg  on Monday and Fridays and as needed. Driveline site looks good. Buttock wound healing. Dressing changed today. INR 2.1. Personally reviewed. VAD interrogated personally. Parameters stable. Refer CR.   Glori Bickers, MD  12:54 PM

## 2017-10-12 NOTE — Patient Instructions (Signed)
1. Decrease buttock dressing to every other day. 2. Use Allevyn pink foam on right buttock wound.  3. Drive line dressing change on Monday and Thursday.  4. Increase Torsemide to 40 mg MWF, take a potassium pill on these days as well. 5. Return to [place] clinic in 2 weeks.

## 2017-10-12 NOTE — Progress Notes (Signed)
Patient presents for 1 week follow up in [facility] today. Reports no problems with VAD equipment or concerns with drive line.   Wife states that the pt had some swelling in his feet and legs so she gave him Torsemide 40 mg on Sunday and Monday.   Vital Signs:  Doppler Pressure: 82 Automatc BP: 109/51 (79) HR:  86 SPO2: UTO %  Weight: 222 lb w/ eqt Last weight: 202lb w/o eqip Home weights: 200-202 lbs   VAD Indication: Destination Therapy- Implanted 08/16/17   LVAD assessment: HM III:  Primary Controller  VAD Speed: 5300 rpms Flow: 3.6 Power: 3.7 w    PI: 4.7 Alarms: no clinical alarms  Events: 70+ PI events  Fixed speed: 5300 Low speed limit: 5000  Primary: Back up battery expiring in 31 months. Secondary: Back up battery expiring in  31 months    I reviewed the LVAD parameters from today and compared the results to the patient's prior recorded data. LVAD interrogation was NEGATIVE for significant power changes, NEGATIVE for clinical alarms and MULTIPLE for PI events/speed drops. Hematocrit was updated, but no other changes were made and pump is functioning within specified parameters. Pt is performing daily controller and system monitor self tests along with completing weekly and monthly maintenance for LVAD equipment.  LVAD equipment check completed and is in good working order. Back-up equipment present. Performed self-test on equipment.   Annual Equipment Maintenance on UBC/PM was performed on 09/17/17.   Exit Site Care:  VAD dressing and anchor removed and site care performed using sterile technique. Drive line exit site cleaned with Chlora prep applicators x 2, allowed to dry, skin protectant applied and allowed to dry before gauze dressing and Aquacel silver strip re-applied. Exit site healing and partially incorporated.The velour is exposed 1/2 inch at exit site. No redness, tenderness, or foul odor noted. Small/moderate amount of serous fluid noted on gauze.  Drive line anchor re-applied. Pt denies fever or chills. Continue changing every other day.       Buttock Wound Care: [name] is doing daily dressing changes on his buttock wounds with Aquacel and gauze. Amy NP, in to assess wound. Silver nitrate used to debride wound. Continue using Aquacel Ag. May decrease to every other day dressing changes.       Significant Events on VAD Support:    Device: Medtronic 3 Lead Therapies: on Last check: 10/17/16  Labs:   Hgb 10.6 - No S/S of bleeding. Specifically denies melena/BRBPR or nosebleeds.  LDH stable at 238 with a baseline ranging from 170-250.  Denies tea-colored urine. No power elevations noted on interrogation.   INR- 2.09- within range of 2-2.5.  Plan:   1. Decrease buttock dressing to every other day. 2. Use Allevyn pink foam on right buttock wound.  3. Continue every other day dressing changes on drive line.   4. Increase Torsemide to 40 mg MWF, take a potassium pill on these days as well. 5. Return to [place] clinic in 2 weeks.  Tanda Rockers RN [place] Coordinator   Office: [phone] 24/7 Emergency VAD Pager: [phone]

## 2017-10-15 ENCOUNTER — Encounter: Payer: Self-pay | Admitting: Nurse Practitioner

## 2017-10-15 ENCOUNTER — Ambulatory Visit (INDEPENDENT_AMBULATORY_CARE_PROVIDER_SITE_OTHER): Payer: PPO | Admitting: Nurse Practitioner

## 2017-10-15 VITALS — HR 99 | Temp 98.7°F | Ht 70.0 in | Wt 214.0 lb

## 2017-10-15 DIAGNOSIS — N183 Chronic kidney disease, stage 3 unspecified: Secondary | ICD-10-CM

## 2017-10-15 DIAGNOSIS — G588 Other specified mononeuropathies: Secondary | ICD-10-CM

## 2017-10-15 DIAGNOSIS — I480 Paroxysmal atrial fibrillation: Secondary | ICD-10-CM | POA: Diagnosis not present

## 2017-10-15 DIAGNOSIS — I5022 Chronic systolic (congestive) heart failure: Secondary | ICD-10-CM | POA: Diagnosis not present

## 2017-10-15 DIAGNOSIS — D649 Anemia, unspecified: Secondary | ICD-10-CM

## 2017-10-15 DIAGNOSIS — L89152 Pressure ulcer of sacral region, stage 2: Secondary | ICD-10-CM

## 2017-10-15 DIAGNOSIS — R1310 Dysphagia, unspecified: Secondary | ICD-10-CM

## 2017-10-15 DIAGNOSIS — I2510 Atherosclerotic heart disease of native coronary artery without angina pectoris: Secondary | ICD-10-CM

## 2017-10-15 DIAGNOSIS — E118 Type 2 diabetes mellitus with unspecified complications: Secondary | ICD-10-CM

## 2017-10-15 DIAGNOSIS — R5381 Other malaise: Secondary | ICD-10-CM | POA: Diagnosis not present

## 2017-10-15 DIAGNOSIS — Z95811 Presence of heart assist device: Secondary | ICD-10-CM

## 2017-10-15 NOTE — Patient Instructions (Signed)
I have placed an order for barium swallow to be done at [facility]. Our office will begin processing this order. Please follow up if you have not heard anything about this referral within 1 week.  Please return in about 1 month for diabetes follow up, we will recheck your A1c and see how you are doing.

## 2017-10-15 NOTE — Progress Notes (Signed)
Name: [name] [name]   MRN: [id number]    DOB: 1950/12/06   Date:10/15/2017       Progress Note  Subjective  Chief Complaint  Chief Complaint  Patient presents with  . Hospitalization Follow-up    Swallowing test order needs to be done at hospital    HPI  [name] [name] is here today for hospital follow up. He is accompanied by his wife. He was admitted to [facility] on 08/11/17 for acute/chronic end-stage systolic HF, acute respiratory failure, atrial fibrillation, [name], followed through his hospital stay by cardiology with LVAD placement on 6/17.  He was then discharged to rehab facility on 09/10/17, where he stayed until 09/25/17 when he was discharged home. Since home, he says has been following with Dr [name], cardiology every 2 weeks as instructed. He has also continued working with speech therapy, physical therapy, and wound care clinic for pressure ulcer to R and L buttock, which he says has almost healed. He tells me aside from wound pain and neuropathy, he is feeling well since home. He describes his neuropathy pain as constant throbbing pain in his legs, maintained on gabapentin for this but says he has only been taking 100mg  at bedtime, not 300mg  as prescribed. He has upcoming appointment with neurology for routine follow up of neuropathic pain. Prior to his hospitalization, I was following his diabetes but due to poor control a referral was made to endocrinology, though he says he only made it to one visit prior to hospitalization and would prefer if I could continue his diabetes management since he has so many other specialists to see now. He is currently maintained only on insulin 70/30 - 30 units every am for his diabetes,  metformin was discontinued during his hospitalization due to renal function. He feels comfortable taking the insulin at home daily, and his wife helps him as well. He does check his glucose readings daily with recent numbers 88-140 fasting and after  meals. No fevers, syncope, confusion, chest pain, shortness of breath, hypoglycemia, tremor, diaphoresis, polyuria, polydipsia, polyphagia   Patient Active Problem List   Diagnosis Date Noted  . Long term (current) use of anticoagulants 09/29/2017  . Decubitus ulcer of sacral region, stage 2 09/24/2017  . Rectal mass   . Irregular prostate   . Sleep disturbance   . Dysphagia   . Diabetes mellitus type 2 in nonobese (HCC)   . Labile blood glucose   . Debility 09/10/2017  . Type 2 diabetes mellitus with retinopathy, with long-term current use of insulin ([place])   . Stage 3 chronic kidney disease ([place])   . Leukocytosis   . Pressure injury of skin 08/27/2017  . Metabolic alkalosis   . Presence of left ventricular assist device (LVAD) ([place])   . S/P TVR (tricuspid valve repair) 08/17/2017  . Goals of care, counseling/discussion   . Palliative care encounter   . Hx of atrial flutter 11/18/2016  . CAD (coronary artery disease)-RCA-T, 70% PL (off CFX), 99% Prox LAD/90% Dist LAD, S/P TAXUS stent x 2 11/18/2016  . HTN (hypertension) 11/18/2016  . Diabetes mellitus with complication ([place]) 40/98/1191  . Thrombocytopenia ([place]) 11/18/2016  . Bilateral lower extremity edema 11/12/2016  . Diabetic polyneuropathy associated with diabetes mellitus due to underlying condition ([place]) 05/29/2015  . Chronic systolic CHF (congestive heart failure) ([place]) 02/05/2014  . Medtronic biventricular ICD, serial number  BLD [id number]  02/05/2014  . CAD (coronary artery disease), native coronary artery 01/08/2014  .  Chronic renal disease, stage III ([place]) 01/08/2014  . Mitral regurgitation 04/17/2013  . Atrial flutter ([place]) 06/03/2012  . Acute kidney injury (nontraumatic) ([place]) 05/31/2012  . Chronic anticoagulation   . Nausea with vomiting 05/18/2012  . PAF (paroxysmal atrial fibrillation) ([place]) 05/17/2012  . Orthopnea 05/17/2012  . TIA (transient ischemic attack) 05/29/2010  . CHEST PAIN 11/13/2008  .  VENTRICULAR TACHYCARDIA 08/14/2008  . Automatic implantable cardioverter-defibrillator in situ 08/14/2008  . Type 2 diabetes with nephropathy ([place]) 08/11/2008  . DYSLIPIDEMIA 08/11/2008  . Cardiomyopathy, ischemic 08/11/2008  . Disorder resulting from impaired renal function 08/11/2008  . HYPERCHOLESTEROLEMIA 04/29/2006  . MYOCARDIAL INFARCTION, OLD 04/29/2006  . NEPHROLITHIASIS 04/29/2006    Past Surgical History:  Procedure Laterality Date  . ATRIAL FLUTTER ABLATION N/A 05/19/2012   Procedure: ATRIAL FLUTTER ABLATION;  Surgeon: Thompson Grayer, MD;  Location: [facility] CATH LAB;  Service: Cardiovascular;  Laterality: N/A;  . BI-VENTRICULAR IMPLANTABLE CARDIOVERTER DEFIBRILLATOR UPGRADE N/A 02/05/2014   Procedure: BI-VENTRICULAR IMPLANTABLE CARDIOVERTER DEFIBRILLATOR UPGRADE;  Surgeon: Evans Lance, MD;  Location: [facility] CATH LAB;  Service: Cardiovascular;  Laterality: N/A;  . BIV ICD GENERTAOR CHANGE OUT  02/05/2014   Upgrade to Medtronic biventricular ICD, serial number  [id number] by Dr. [name]  . CARDIAC DEFIBRILLATOR PLACEMENT  2007    Medtronic Maximo VR, serial number [id number]  . INSERTION OF IMPLANTABLE LEFT VENTRICULAR ASSIST DEVICE N/A 08/16/2017   Procedure: INSERTION OF IMPLANTABLE LEFT VENTRICULAR ASSIST DEVICE-HM3;  Surgeon: Ivin Poot, MD;  Location: [place];  Service: Open Heart Surgery;  Laterality: N/A;  . IR FLUORO GUIDE CV LINE RIGHT  08/12/2017  . IR US GUIDE VASC ACCESS RIGHT  08/12/2017  . MEDIASTINAL EXPLORATION  08/17/2017   Procedure: MEDIASTINAL REXPLORATION with evacuation of hematoma;  Surgeon: Prescott Gum, Collier Salina, MD;  Location: [facility] OR;  Service: Open Heart Surgery;;  . PERCUTANEOUS CORONARY STENT INTERVENTION (PCI-S)  January 2002   PTCA/Stent Distal RCA  . PERCUTANEOUS CORONARY STENT INTERVENTION (PCI-S)  June 2002   PTCA/Stent x 3 RCA, thrombolysis - failed  . PERCUTANEOUS CORONARY STENT INTERVENTION (PCI-S)  July 2006   TAXUS stents to prox and distal LAD  . RIGHT  HEART CATH N/A 07/22/2017   Procedure: RIGHT HEART CATH;  Surgeon: Jolaine Artist, MD;  Location: [place] CV LAB;  Service: Cardiovascular;  Laterality: N/A;  . RIGHT HEART CATH N/A 08/13/2017   Procedure: RIGHT HEART CATH - swan;  Surgeon: Jolaine Artist, MD;  Location: [place] CV LAB;  Service: Cardiovascular;  Laterality: N/A;  . RIGHT/LEFT HEART CATH AND CORONARY ANGIOGRAPHY N/A 02/18/2017   Procedure: RIGHT/LEFT HEART CATH AND CORONARY ANGIOGRAPHY;  Surgeon: Jolaine Artist, MD;  Location: [place] CV LAB;  Service: Cardiovascular;  Laterality: N/A;  . STERNAL CLOSURE N/A 08/19/2017   Procedure: STERNAL CLOSURE;  Surgeon: Ivin Poot, MD;  Location: [place];  Service: Thoracic;  Laterality: N/A;  . TEE WITHOUT CARDIOVERSION N/A 08/16/2017   Procedure: TRANSESOPHAGEAL ECHOCARDIOGRAM (TEE);  Surgeon: Prescott Gum, Collier Salina, MD;  Location: [place];  Service: Open Heart Surgery;  Laterality: N/A;  . TEE WITHOUT CARDIOVERSION N/A 08/19/2017   Procedure: TRANSESOPHAGEAL ECHOCARDIOGRAM (TEE);  Surgeon: Prescott Gum, Collier Salina, MD;  Location: [place];  Service: Thoracic;  Laterality: N/A;  . TRICUSPID VALVE REPLACEMENT N/A 08/16/2017   Procedure: TRICUSPID VALVE REPAIR using Oletta Lamas MC3 Ring size 30;  Surgeon: Ivin Poot, MD;  Location: [place];  Service: Open Heart Surgery;  Laterality: N/A;  Family History  Problem Relation Age of Onset  . Heart failure Father        Deceased  . Alzheimer's disease Mother        Living  . Heart attack Mother   . CAD Brother   . Other Brother        blood cancer  . CAD Brother   . Prostate cancer Brother   . CAD Brother   . CAD Brother   . Healthy Son   . Healthy Daughter   . Diabetes Brother   . Stomach cancer Neg Hx   . Colon cancer Neg Hx     Social History   Socioeconomic History  . Marital status: Married    Spouse name: Not on file  . Number of children: 4  . Years of education: 52  . Highest education level: Not on file   Occupational History  . Occupation: Psychiatrist - currently unemployed  Social Needs  . Financial resource strain: Not very hard  . Food insecurity:    Worry: Never true    Inability: Never true  . Transportation needs:    Medical: No    Non-medical: No  Tobacco Use  . Smoking status: Former Smoker    Packs/day: 1.00    Years: 29.00    Pack years: 29.00    Types: Cigarettes    Last attempt to quit: 03/02/2000    Years since quitting: 17.6  . Smokeless tobacco: Never Used  Substance and Sexual Activity  . Alcohol use: No    Alcohol/week: 0.0 standard drinks  . Drug use: No  . Sexual activity: Yes    Partners: Female    Birth control/protection: None  Lifestyle  . Physical activity:    Days per week: Patient refused    Minutes per session: Patient refused  . Stress: Only a little  Relationships  . Social connections:    Talks on phone: Patient refused    Gets together: Patient refused    Attends religious service: Patient refused    Active member of club or organization: Patient refused    Attends meetings of clubs or organizations: Patient refused    Relationship status: Patient refused  . Intimate partner violence:    Fear of current or ex partner: Patient refused    Emotionally abused: Patient refused    Physically abused: Patient refused    Forced sexual activity: Patient refused  Other Topics Concern  . Not on file  Social History Narrative   Pt lives with wife in a one story home - married for 71 years. He hunts regularly and is active, walking > 1 mile at times without difficulty.  Has 4 children.     Retired Horticulturist, commercial.  Education: some college.     Current Outpatient Medications:  .  ascorbic acid (VITAMIN C) 500 MG tablet, Take 1 tablet (500 mg total) by mouth 2 (two) times daily., Disp: 100 tablet, Rfl: 0 .  aspirin 81 MG chewable tablet, Chew 1 tablet (81 mg total) by mouth daily., Disp: , Rfl:  .  gabapentin (NEURONTIN) 100 MG capsule, Take 1 capsule  (100 mg total) by mouth at bedtime., Disp: 90 capsule, Rfl: 10 .  Insulin Isophane & Regular Human (NOVOLIN 70/30 FLEXPEN) (70-30) 100 UNIT/ML PEN, Inject into the skin daily., Disp: , Rfl:  .  Insulin Pen Needle (COMFORT EZ PEN NEEDLES) 32G X 6 MM MISC, 1 application by Does not apply route at bedtime., Disp: 100 each,  Rfl: 0 .  magnesium oxide (MAG-OX) 400 (241.3 Mg) MG tablet, Take 1 tablet (400 mg total) by mouth 2 (two) times daily., Disp: 60 tablet, Rfl: 0 .  Maltodextrin-Xanthan Gum (RESOURCE THICKENUP CLEAR) POWD, Take 1 g by mouth as needed., Disp: 1 Can, Rfl: 0 .  Multiple Vitamin (MULTIVITAMIN WITH MINERALS) TABS tablet, Take 1 tablet by mouth daily., Disp: , Rfl:  .  potassium chloride (K-DUR) 10 MEQ tablet, Take 1 tablet (10 mEq total) by mouth every Monday, Wednesday, and Friday., Disp: 90 tablet, Rfl: 3 .  protein supplement shake (PREMIER PROTEIN) LIQD, Take 325 mLs (11 oz total) by mouth 2 (two) times daily between meals., Disp: , Rfl: 0 .  spironolactone (ALDACTONE) 25 MG tablet, Take 1 tablet (25 mg total) by mouth daily., Disp: 30 tablet, Rfl: 0 .  torsemide (DEMADEX) 20 MG tablet, Take 2 tablets (40 mg total) by mouth every Monday, Wednesday, and Friday., Disp: 90 tablet, Rfl: 0 .  warfarin (COUMADIN) 2.5 MG tablet, Take 1 tablet (2.5 mg total) by mouth daily at 6 PM., Disp: 30 tablet, Rfl: 0  Allergies  Allergen Reactions  . Penicillins Hives, Itching and Other (See Comments)    Has patient had a PCN reaction causing immediate rash, facial/tongue/throat swelling, SOB or lightheadedness with hypotension:# # Yes # # Has patient had a PCN reaction causing severe rash involving mucus membranes or skin necrosis: No Has patient had a PCN reaction that required hospitalization: No Has patient had a PCN reaction occurring within the last 10 years: No If all of the above answers are "NO", then may proceed with Cephalosporin use.      ROS See HPI  Objective  Vitals:    10/15/17 1303  Pulse: 99  Temp: 98.7 F (37.1 C)  TempSrc: Oral  SpO2: 96%  Weight: 214 lb (97.1 kg)  Height: 5\' 10"  (1.778 m)  LVAD  Body mass index is 30.71 kg/m.  Physical Exam Constitutional: Patient appears well-developed and well-nourished. No distress.  HENT: Head: Normocephalic and atraumatic.  Nose: Nose normal. Mouth/Throat: Oropharynx is clear and moist. No oropharyngeal exudate.  Eyes: Conjunctivae and EOM are normal. Pupils are equal, round, and reactive to light. No scleral icterus.  Neck: Normal range of motion. Neck supple.  Cardiovascular: LVAD with mechanical heart sounds. Pulmonary/Chest: Effort normal and breath sounds normal. No respiratory distress. Musculoskeletal: Normal range of motion, No gross deformities Neurological: he is alert and oriented to person, place, and time. No cranial nerve deficit. Coordination, balance, strength, speech are normal. Ambulatory with cane. Skin: Skin is warm and dry. No rash noted. No erythema.  Psychiatric: Patient has a normal mood and affect. behavior is normal. Judgment and thought content normal.   Assessment & Plan RTC in 1 month for F/U-DM- repeat A1c  Anemia, unspecified type Stable Recent CBC by cardiology

## 2017-10-19 ENCOUNTER — Ambulatory Visit (HOSPITAL_COMMUNITY): Payer: Self-pay | Admitting: Pharmacist

## 2017-10-19 DIAGNOSIS — Z7901 Long term (current) use of anticoagulants: Secondary | ICD-10-CM

## 2017-10-19 LAB — POCT INR: INR: 1.3 — AB (ref 2.0–3.0)

## 2017-10-19 MED ORDER — ENOXAPARIN SODIUM 40 MG/0.4ML ~~LOC~~ SOLN: 40.0000 mg | Freq: Two times a day (BID) | SUBCUTANEOUS | 1 refills | Status: DC

## 2017-10-20 ENCOUNTER — Telehealth (HOSPITAL_COMMUNITY): Payer: Self-pay | Admitting: *Deleted

## 2017-10-20 ENCOUNTER — Other Ambulatory Visit (HOSPITAL_COMMUNITY): Payer: Self-pay | Admitting: *Deleted

## 2017-10-20 DIAGNOSIS — Z95811 Presence of heart assist device: Secondary | ICD-10-CM

## 2017-10-20 DIAGNOSIS — Z7901 Long term (current) use of anticoagulants: Secondary | ICD-10-CM

## 2017-10-20 MED ORDER — WARFARIN SODIUM 2.5 MG PO TABS: 2.5000 mg | ORAL_TABLET | Freq: Every day | ORAL | 0 refills | Status: DC

## 2017-10-20 NOTE — Telephone Encounter (Signed)
Left a message returning [name] [name] phone call requesting a refill for his Warfarin. Refill request sent to the [place] we have on file.   Emerson Monte RN  [place] Coordinator  24/7 Pager [phone]

## 2017-10-21 DIAGNOSIS — N400 Enlarged prostate without lower urinary tract symptoms: Secondary | ICD-10-CM | POA: Diagnosis not present

## 2017-10-22 ENCOUNTER — Telehealth (HOSPITAL_COMMUNITY): Payer: Self-pay

## 2017-10-22 ENCOUNTER — Ambulatory Visit (HOSPITAL_COMMUNITY): Payer: Self-pay | Admitting: Pharmacist

## 2017-10-22 DIAGNOSIS — Z7901 Long term (current) use of anticoagulants: Secondary | ICD-10-CM

## 2017-10-22 LAB — POCT INR: INR: 1.9 — AB (ref 2.0–3.0)

## 2017-10-22 MED ORDER — WARFARIN SODIUM 2.5 MG PO TABS: ORAL_TABLET | ORAL | 5 refills | Status: DC

## 2017-10-22 NOTE — Telephone Encounter (Signed)
I called Ms Testa to try to schedule an appointment with Mr Holaway. She did not answer so I left a voicemail requesting she call me back.

## 2017-10-22 NOTE — Telephone Encounter (Signed)
Called to follow up with patient in regards to Cardiac Rehab - patient stated he is not interested in participating. Closed referral.

## 2017-10-22 NOTE — Telephone Encounter (Signed)
I called [name] [name] to see if he was available for an appointment this afternoon. He did not answer so I left a voicemail requesting he call me back.

## 2017-10-25 ENCOUNTER — Other Ambulatory Visit (HOSPITAL_COMMUNITY): Payer: Self-pay | Admitting: Unknown Physician Specialty

## 2017-10-25 DIAGNOSIS — I5023 Acute on chronic systolic (congestive) heart failure: Secondary | ICD-10-CM | POA: Diagnosis not present

## 2017-10-25 DIAGNOSIS — Z7901 Long term (current) use of anticoagulants: Secondary | ICD-10-CM

## 2017-10-25 DIAGNOSIS — L89310 Pressure ulcer of right buttock, unstageable: Secondary | ICD-10-CM | POA: Diagnosis not present

## 2017-10-25 DIAGNOSIS — Z95811 Presence of heart assist device: Secondary | ICD-10-CM

## 2017-10-25 DIAGNOSIS — I5043 Acute on chronic combined systolic (congestive) and diastolic (congestive) heart failure: Secondary | ICD-10-CM | POA: Diagnosis not present

## 2017-10-25 DIAGNOSIS — I252 Old myocardial infarction: Secondary | ICD-10-CM | POA: Diagnosis not present

## 2017-10-25 DIAGNOSIS — E1121 Type 2 diabetes mellitus with diabetic nephropathy: Secondary | ICD-10-CM | POA: Diagnosis not present

## 2017-10-26 ENCOUNTER — Ambulatory Visit (HOSPITAL_COMMUNITY)
Admission: RE | Admit: 2017-10-26 | Discharge: 2017-10-26 | Disposition: A | Discharge status: Home / Self Care | Payer: PPO | Source: Ambulatory Visit | Attending: Nurse Practitioner | Admitting: Nurse Practitioner

## 2017-10-26 ENCOUNTER — Ambulatory Visit (HOSPITAL_COMMUNITY): Payer: Self-pay | Admitting: Unknown Physician Specialty

## 2017-10-26 ENCOUNTER — Ambulatory Visit (HOSPITAL_COMMUNITY)
Admission: RE | Admit: 2017-10-26 | Discharge: 2017-10-26 | Disposition: A | Discharge status: Home / Self Care | Payer: PPO | Source: Ambulatory Visit | Attending: Cardiology | Admitting: Cardiology

## 2017-10-26 ENCOUNTER — Telehealth (HOSPITAL_COMMUNITY): Payer: Self-pay

## 2017-10-26 VITALS — BP 95/71 | Ht 70.0 in | Wt 225.8 lb

## 2017-10-26 DIAGNOSIS — R1310 Dysphagia, unspecified: Secondary | ICD-10-CM | POA: Insufficient documentation

## 2017-10-26 DIAGNOSIS — Z95811 Presence of heart assist device: Secondary | ICD-10-CM | POA: Insufficient documentation

## 2017-10-26 DIAGNOSIS — E1122 Type 2 diabetes mellitus with diabetic chronic kidney disease: Secondary | ICD-10-CM | POA: Insufficient documentation

## 2017-10-26 DIAGNOSIS — Z95810 Presence of automatic (implantable) cardiac defibrillator: Secondary | ICD-10-CM | POA: Diagnosis not present

## 2017-10-26 DIAGNOSIS — I5022 Chronic systolic (congestive) heart failure: Secondary | ICD-10-CM | POA: Insufficient documentation

## 2017-10-26 DIAGNOSIS — I4892 Unspecified atrial flutter: Secondary | ICD-10-CM | POA: Diagnosis not present

## 2017-10-26 DIAGNOSIS — I255 Ischemic cardiomyopathy: Secondary | ICD-10-CM | POA: Diagnosis not present

## 2017-10-26 DIAGNOSIS — Z794 Long term (current) use of insulin: Secondary | ICD-10-CM | POA: Insufficient documentation

## 2017-10-26 DIAGNOSIS — I472 Ventricular tachycardia: Secondary | ICD-10-CM

## 2017-10-26 DIAGNOSIS — E7800 Pure hypercholesterolemia, unspecified: Secondary | ICD-10-CM | POA: Diagnosis not present

## 2017-10-26 DIAGNOSIS — I2510 Atherosclerotic heart disease of native coronary artery without angina pectoris: Secondary | ICD-10-CM | POA: Insufficient documentation

## 2017-10-26 DIAGNOSIS — I130 Hypertensive heart and chronic kidney disease with heart failure and stage 1 through stage 4 chronic kidney disease, or unspecified chronic kidney disease: Secondary | ICD-10-CM | POA: Diagnosis not present

## 2017-10-26 DIAGNOSIS — L89309 Pressure ulcer of unspecified buttock, unspecified stage: Secondary | ICD-10-CM | POA: Diagnosis not present

## 2017-10-26 DIAGNOSIS — I5082 Biventricular heart failure: Secondary | ICD-10-CM | POA: Insufficient documentation

## 2017-10-26 DIAGNOSIS — Z955 Presence of coronary angioplasty implant and graft: Secondary | ICD-10-CM | POA: Diagnosis not present

## 2017-10-26 DIAGNOSIS — Z7901 Long term (current) use of anticoagulants: Secondary | ICD-10-CM

## 2017-10-26 DIAGNOSIS — I4891 Unspecified atrial fibrillation: Secondary | ICD-10-CM | POA: Diagnosis not present

## 2017-10-26 DIAGNOSIS — D649 Anemia, unspecified: Secondary | ICD-10-CM | POA: Diagnosis not present

## 2017-10-26 DIAGNOSIS — N183 Chronic kidney disease, stage 3 (moderate): Secondary | ICD-10-CM | POA: Diagnosis not present

## 2017-10-26 DIAGNOSIS — Z79899 Other long term (current) drug therapy: Secondary | ICD-10-CM | POA: Diagnosis not present

## 2017-10-26 DIAGNOSIS — I4729 Other ventricular tachycardia: Secondary | ICD-10-CM

## 2017-10-26 DIAGNOSIS — Z7982 Long term (current) use of aspirin: Secondary | ICD-10-CM | POA: Insufficient documentation

## 2017-10-26 LAB — PROTIME-INR
INR: 2.45
Prothrombin Time: 26.4 seconds — ABNORMAL HIGH (ref 11.4–15.2)

## 2017-10-26 LAB — LACTATE DEHYDROGENASE: LDH: 208 U/L — ABNORMAL HIGH (ref 98–192)

## 2017-10-26 LAB — CBC
HCT: 33.6 % — ABNORMAL LOW (ref 39.0–52.0)
Hemoglobin: 10 g/dL — ABNORMAL LOW (ref 13.0–17.0)
MCH: 24.6 pg — AB (ref 26.0–34.0)
MCHC: 29.8 g/dL — ABNORMAL LOW (ref 30.0–36.0)
MCV: 82.6 fL (ref 78.0–100.0)
PLATELETS: 254 10*3/uL (ref 150–400)
RBC: 4.07 MIL/uL — AB (ref 4.22–5.81)
RDW: 20.3 % — ABNORMAL HIGH (ref 11.5–15.5)
WBC: 5.7 10*3/uL (ref 4.0–10.5)

## 2017-10-26 LAB — BASIC METABOLIC PANEL
Anion gap: 10 (ref 5–15)
BUN: 23 mg/dL (ref 8–23)
CO2: 28 mmol/L (ref 22–32)
CREATININE: 1.36 mg/dL — AB (ref 0.61–1.24)
Calcium: 9.1 mg/dL (ref 8.9–10.3)
Chloride: 100 mmol/L (ref 98–111)
GFR calc non Af Amer: 52 mL/min — ABNORMAL LOW (ref >60)
Glucose, Bld: 154 mg/dL — ABNORMAL HIGH (ref 70–99)
POTASSIUM: 3.6 mmol/L (ref 3.5–5.1)
SODIUM: 138 mmol/L (ref 135–145)

## 2017-10-26 MED ORDER — SPIRONOLACTONE 25 MG PO TABS: 25.0000 mg | ORAL_TABLET | Freq: Every day | ORAL | 11 refills | Status: DC

## 2017-10-26 NOTE — Progress Notes (Signed)
VAD Clinic Note   HF: Dr. [name]   HPI: [name] [name] is a 67 y.o. male with h/o chronic systolic CHF due to ICM, s/p LVAD HM3 08/16/2017, [name], [name], [name], [name], [name], HTN, HL, and CKD III.  Admitted 08/11/17 with recurrent A/C systolic CHF. Milrinone added, but mixed venous saturation remained low. Seen by CT surgery/VAD team and deemed appropriate for HMIII under DT criteria. He underwent HMIII on 6/17. Chest was unable to be closed due to high intrathoracic pressure and RV failure.  He returned to the OR on 6/18 for evacuation of hematoma. Chest was later closed on 6/20. Post operative course complicated by  open chest, RV failure, recurrent VT, respiratory failure, and dysphagia. CCM consulted for vent management with extubation completed on 08/26/17.  As he improved all drips gradually weaned off. Prior to d/c  ramp echo was completed with speed optimized.  Spent 7/11 - 7/27 in CIR. Underwent hydrotherapy for his buttock wound(s). 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    Follow up for Heart Failure/LVAD:  Today he returns for weekly VAD follow up with his wife. Feeling much better. More active but not wanting to go to cardiac rehab yet. Walking around the house. No SOB, orthopnea or PND. Taking torsemide MWF. Has not needed extra. Voice getting stronger. Denies orthopnea or PND. No fevers, chills or problems with driveline. No bleeding, melena or neuro symptoms. No VAD alarms. Taking all meds as prescribed.    VAD Indication: Destination Therapy- Implanted 08/16/17   LVAD assessment: HM III:  Primary Controller  VAD Speed: 5300 rpms Flow: 3.9 Power: 3.9 w PI: 4.2 Alarms: no clinical alarms Events: rare PI events; except 8/23 pt had 30+ PI events-pt doesn't remember anything significant on this day  Fixed speed: 5300 Low  speed limit: 5000  Primary: Back up battery expiring in 31 months. Secondary: Back up battery expiring in  31 months   RHC 08/13/17 On milrinone 0.375 mcg/kg/min RA = 12 RV = 48/13 PA = 50/26 (35) PCW = 20 Fick cardiac output/index = 5.1/2.5 PVR = 3.0 Ao sat = 93% PA sat = 56%, 57% PAPI = 2.0 RA/PCWP = 0.60  R/LHC 01/2017  Prox RCA to Mid RCA lesion is 100% stenosed.  Prox RCA lesion is 100% stenosed.  Ost Cx to Prox Cx lesion is 40% stenosed.  Dist Cx lesion is 70% stenosed.  Ost 1st Mrg to 1st Mrg lesion is 40% stenosed.  1st Mrg lesion is 70% stenosed.  Prox LAD lesion is 50% stenosed.  Prox LAD to Mid LAD lesion is 60% stenosed.   Findings: Ao = 99/56 (73) LV = 101/5 RA = 1 RV = 29/1 PA = 23/11 (16) PCW = 6 Fick cardiac output/index = 3.8/1.7 Thermo CO/CI = 4.1/1.9 PVR = 2.6 Ao sat = 99% PA sat = 60%, 61%  Assessment: -3v CAD with 1) LAD diffuse 60% stenosis 2) LCX multiple 40% lesions 3) RCA chronically occluded proximally with L to R colllterals  Past Medical History:  Diagnosis Date  . AICD (automatic cardioverter/defibrillator) present 02/05/2014   Upgrade to Medtronic biventricular ICD, serial number  [id number]   . Atrial flutter ([place]) 04/2012   s/p TEE-EPS+RFCA 04/2012  . CAD (coronary artery disease) 8119,1478 X 2    RCA-T, 70% PL (off CFX), 99% Prox LAD/90% Dist LAD, S/P  TAXUS stent x 2  . CHF (congestive heart failure) ([place])   . Chronic anticoagulation   . Chronic systolic heart failure ([place])   . CKD (chronic kidney disease)   . Diabetic retinopathy ([place])   . DM type 2 (diabetes mellitus, type 2) (HCC)    insulin dependent  . HTN (hypertension)   . Hypercholesteremia    ablation  . ICD (implantable cardiac defibrillator) in place   . Ischemic cardiomyopathy March 2015   20-25% 2D   . Nephrolithiasis   . Ventricular tachycardia ([place])     Current Outpatient Medications  Medication Sig Dispense Refill  . ascorbic acid  (VITAMIN C) 500 MG tablet Take 1 tablet (500 mg total) by mouth 2 (two) times daily. 100 tablet 0  . aspirin 81 MG chewable tablet Chew 1 tablet (81 mg total) by mouth daily.    Marland Kitchen gabapentin (NEURONTIN) 100 MG capsule Take 1 capsule (100 mg total) by mouth at bedtime. (Patient taking differently: Take 300 mg by mouth at bedtime. ) 90 capsule 10  . Insulin Isophane & Regular Human (NOVOLIN 70/30 FLEXPEN) (70-30) 100 UNIT/ML PEN Inject into the skin daily.    . Insulin Pen Needle (COMFORT EZ PEN NEEDLES) 32G X 6 MM MISC 1 application by Does not apply route at bedtime. 100 each 0  . magnesium oxide (MAG-OX) 400 (241.3 Mg) MG tablet Take 1 tablet (400 mg total) by mouth 2 (two) times daily. 60 tablet 0  . Maltodextrin-Xanthan Gum (RESOURCE THICKENUP CLEAR) POWD Take 1 g by mouth as needed. 1 Can 0  . Multiple Vitamin (MULTIVITAMIN WITH MINERALS) TABS tablet Take 1 tablet by mouth daily.    . potassium chloride (K-DUR) 10 MEQ tablet Take 1 tablet (10 mEq total) by mouth every Monday, Wednesday, and Friday. 90 tablet 3  . protein supplement shake (PREMIER PROTEIN) LIQD Take 325 mLs (11 oz total) by mouth 2 (two) times daily between meals.  0  . spironolactone (ALDACTONE) 25 MG tablet Take 1 tablet (25 mg total) by mouth daily. 30 tablet 11  . torsemide (DEMADEX) 20 MG tablet Take 2 tablets (40 mg total) by mouth every Monday, Wednesday, and Friday. 90 tablet 0  . warfarin (COUMADIN) 2.5 MG tablet Take 1 tablet (2.5 mg) daily except 2 tablets (5 mg) on Monday OR AS DIRECTED BY HF CLINIC 32 tablet 5   No current facility-administered medications for this encounter.     Penicillins  Review of systems complete and found to be negative unless listed in HPI.    Vitals:   10/26/17 1013 10/26/17 1014  BP: (!) 74/0 95/71  Weight: 102.4 kg (225 lb 12.8 oz)   Height: 5\' 10"  (1.778 m)     Wt Readings from Last 3 Encounters:  10/26/17 102.4 kg (225 lb 12.8 oz)  10/15/17 97.1 kg (214 lb)  10/12/17 100.7  kg (222 lb)    Vital Signs:  Doppler Pressure:74 Automatc BP: 95/71 (79) HR: 101 SPO2: 95 %  Weight: 225.8 lb w/ eqt Last weight: 222lb w/o eqip Home weights: 200-202 lbs  Vitals:   10/26/17 1013 10/26/17 1014  BP: (!) 74/0 95/71  Weight: 102.4 kg (225 lb 12.8 oz)   Height: 5\' 10"  (1.778 m)     Physical Exam: General:  NAD.  HEENT: normal  Neck: supple. JVP 9-10.  Carotids 2+ bilat; no bruits. No lymphadenopathy or thryomegaly appreciated. Cor: LVAD hum.  Lungs: Clear. Abdomen: obese soft, nontender, non-distended. No hepatosplenomegaly. No bruits or masses.  Good bowel sounds. Driveline site clean. Minimal clear drainage. Anchor in place.  Extremities: no cyanosis, clubbing, rash. Warm trace-1+ edema  Buttocks wound ok Neuro: alert & oriented x 3. No focal deficits. Moves all 4 without problem    ASSESSMENT AND PLAN:  1. Chronic systolic CHF with biventricular failure-> cardiogenic shock: - Echo 08/13/2017 EF 20-25%.s/p Medtronic BiV ICD in place. Cath 12/18 with stable 1v CAD. s/p HM-3 implant 08/16/17.  - Continues to improve. NYHA II symptoms with VAD. Encouraged him to be more active and sign up for CR - Volume status minimally elevated. Will continue torsemide 40 mg M/W/F. Take extra as needed - Continue spiro 25 mg daily.  - Stop digoxin at next visit 2. 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 - On aspirin and coumadin. No bleeding  - INR goal 2-2.5 INR 2.45 . Personally reviewed - Ramp ECHO completed 09/09/2017 with speed increased to 5300.  - VAD interrogated personally. Parameters stable.  - LDH 208 3. CKD III: - Creatinine stable 1.3 4. [name]/VF:  - Quiescent. He is off amio.  - Recurrent VT on 6/22, VF 6/24, and VT 6/26 requiring emergent bedside cardioversion.  5. AFL/atrial fibrillation:  - S/p previous ablation. He is currently in atrial fibrillation.  -Rate controlled. Off amio. On coumadin as above.  6. CAD s/p PCI of RCA and UXL:KGMWNU cath with  stable CAD as above:  - No s/s of ischemia  - Continue ASA and statin.  7. [name]: Recent A1c8.3 on 6/3. - Per PCP.  8. Anemia:  - hgb stable at 10.0 9. Deconditioning - much improved. Encouraged him to go to CR 10. Buttock Pressure Ulcer: - much improved. contiue current wound care 11. Hoarseness - REsolving 12. Rectal Mass - CT reviewed with Radiology. Suspect no mass present  13. Driveline drainage - Finished doxy 100 bid x 7days. Driveline starting to incorporate. Stil with mild clear drainage. Continue to follow. No overt signs infection.  Total time spent 35 minutes. Over half that time spent discussing above.    Glori Bickers, MD  10/26/17

## 2017-10-26 NOTE — Patient Instructions (Signed)
1. Continue every other day buttock dressing changes.  2. Continue every other day dressing changes on drive line.   3. Return to [facility] clinic in 3 weeks.

## 2017-10-26 NOTE — Progress Notes (Signed)
Patient presents for 2 week follow up in [facility] today. Reports no problems with VAD equipment or concerns with drive line.   Pt states that he is doing Torsemide MWF and that the pt has only taken 2 extra doses of Torsemide in the last 2 weeks.  Vital Signs:  Doppler Pressure: 74 Automatc BP: 95/71 (79) HR:  101 SPO2: 95 %  Weight: 225.8 lb w/ eqt Last weight: 222lb w/o eqip Home weights: 200-202 lbs   VAD Indication: Destination Therapy- Implanted 08/16/17   LVAD assessment: HM III:  Primary Controller  VAD Speed: 5300 rpms Flow: 3.9 Power: 3.9 w    PI: 4.2 Alarms: no clinical alarms  Events: rare PI events; except 8/23 pt had 30+ PI events-pt doesn't remember anything significant on this day  Fixed speed: 5300 Low speed limit: 5000  Primary: Back up battery expiring in 31 months. Secondary: Back up battery expiring in  31 months    I reviewed the LVAD parameters from today and compared the results to the patient's prior recorded data. LVAD interrogation was NEGATIVE for significant power changes, NEGATIVE for clinical alarms and MULTIPLE for PI events/speed drops. Hematocrit was updated, but no other changes were made and pump is functioning within specified parameters. Pt is performing daily controller and system monitor self tests along with completing weekly and monthly maintenance for LVAD equipment.  LVAD equipment check completed and is in good working order. Back-up equipment present. Performed self-test on equipment.   Annual Equipment Maintenance on UBC/PM was performed on 09/17/17.   Exit Site Care:  VAD dressing and anchor removed and site care performed using sterile technique. Drive line exit site cleaned with Chlora prep applicators x 2, allowed to dry, skin protectant applied and allowed to dry before gauze dressing and Aquacel silver strip re-applied. Exit site healing and partially incorporated.The velour is exposed 1/2 inch at exit site. No  redness, tenderness, or foul odor noted. Small/moderate amount of yellow draiange noted on gauze. Drive line anchor re-applied. Pt denies fever or chills. Continue changing every other day.         Buttock Wound Care: [name] is doing daily dressing changes on his buttock wounds with Aquacel and gauze. Amy NP, in to assess wound. Silver nitrate used to debride wound. Continue using Aquacel Ag. May decrease to every other day dressing changes.        Significant Events on VAD Support:    Device: Medtronic 3 Lead Therapies: on Last check: 10/17/16  Labs:   Hgb 10.0 - No S/S of bleeding. Specifically denies melena/BRBPR or nosebleeds.  LDH stable at 208 with a baseline ranging from 170-250.  Denies tea-colored urine. No power elevations noted on interrogation.   INR- 2.45- within range of 2-2.5.  Plan:   1. Continue every other day buttock dressing changes.  2. Continue every other day dressing changes on drive line.   3. Please call cardiac rehab and get enrolled per Dr. [name]. 4. Return to [place] [facility] in 3 weeks.  Tanda Rockers RN [place] Coordinator   Office: [phone] 24/7 Emergency VAD Pager: [phone]

## 2017-10-26 NOTE — Progress Notes (Signed)
Modified Barium Swallow Progress Note  Patient Details  Name: [name] [name] MRN: [id number] Date of Birth: March 30, 1950  Today's Date: 10/26/2017  Modified Barium Swallow completed.  Full report located under Chart Review in the Imaging Section.  Brief recommendations include the following:  Clinical Impression  Pt's swallowing is WFL. He had minimal flash penetration that occurred during large, consecutive straw sips of thin liquids, with all barium exiting the laryngeal vestibule upon completion of the swallow. No aspiration was observed. Recommend that pt advance to regular textures, thin liquids.    Swallow Evaluation Recommendations       SLP Diet Recommendations: Regular solids;Thin liquid   Liquid Administration via: Cup;Straw   Medication Administration: Whole meds with liquid   Supervision: Patient able to self feed   Compensations: Slow rate;Small sips/bites   Postural Changes: Seated upright at 90 degrees   Oral Care Recommendations: Oral care BID        Germain Osgood 10/26/2017,1:21 PM   Germain Osgood, M.A. CCC-SLP [phone]

## 2017-10-26 NOTE — Telephone Encounter (Signed)
Patient called and is now interested in participating in [place] after having f/u appt with Dr.Bensimhon. Will verify insurance and benefits. Passed referral to RN Navigator for review.

## 2017-10-27 ENCOUNTER — Other Ambulatory Visit: Payer: Self-pay | Admitting: Family

## 2017-10-28 ENCOUNTER — Telehealth (HOSPITAL_COMMUNITY): Payer: Self-pay

## 2017-10-28 NOTE — Telephone Encounter (Signed)
Pt insurance is active and benefits verified through [employer]. Co-pay $15.00, DED $0.00/$0.00 met, out of pocket $3,400.00/$2,679.31 met, co-insurance 0%. No pre-authorization required. Lesia/HTA, 10/28/17 @ 12:12PM, REF#

## 2017-10-28 NOTE — Telephone Encounter (Signed)
Called patient to see if he is interested in the Cardiac Rehab Program. Patient expressed interest. Explained scheduling process and went over insurance, patient verbalized understanding.  Patient stated he is recv'ing [facility] for his wound care. He has maybe 5 weeks left with HHRN. Adv pt he would have to 1st complete his [facility] before we can schedule orientation. Patient verbalized understanding, paperwork in HHPT bin.

## 2017-10-28 NOTE — Telephone Encounter (Signed)
Called patient to see if he was interested in participating in the Cardiac Rehab Program. Patient stated yes. Patient will come in for orientation on 12/09/17 @ 8:30AM and will attend the 11:15AM exercise class.  Mailed homework package.

## 2017-10-29 ENCOUNTER — Telehealth (HOSPITAL_COMMUNITY): Payer: Self-pay

## 2017-10-29 ENCOUNTER — Other Ambulatory Visit (HOSPITAL_COMMUNITY): Payer: Self-pay

## 2017-10-29 NOTE — Telephone Encounter (Signed)
I called Mr Deiss to schedule an appointment. Mrs Nelles answered the phone and advised that he was still in bed but said I could come this afternoon around 13:30. This will be a final visit since he is doing so well.

## 2017-10-29 NOTE — Progress Notes (Signed)
Paramedicine Encounter    Patient ID: [name] [name], male    DOB: 18-Aug-1950, 67 y.o.   MRN: [id number]   Patient Care Team: [name], NP as PCP - General (Nurse Practitioner) Evans Lance, MD as Consulting Physician (Cardiology) Alda Berthold, DO as Consulting Physician (Neurology) Bensimhon, Shaune Pascal, MD as Consulting Physician (Cardiology)  Patient Active Problem List   Diagnosis Date Noted  . Long term (current) use of anticoagulants 09/29/2017  . Decubitus ulcer of sacral region, stage 2 09/24/2017  . Rectal mass   . Irregular prostate   . Sleep disturbance   . Dysphagia   . Diabetes mellitus type 2 in nonobese (HCC)   . Labile blood glucose   . Debility 09/10/2017  . Type 2 diabetes mellitus with retinopathy, with long-term current use of insulin ([place])   . Stage 3 chronic kidney disease ([place])   . Leukocytosis   . Pressure injury of skin 08/27/2017  . Metabolic alkalosis   . Presence of left ventricular assist device (LVAD) ([place])   . S/P TVR (tricuspid valve repair) 08/17/2017  . Goals of care, counseling/discussion   . Palliative care encounter   . Hx of atrial flutter 11/18/2016  . CAD (coronary artery disease)-RCA-T, 70% PL (off CFX), 99% Prox LAD/90% Dist LAD, S/P TAXUS stent x 2 11/18/2016  . HTN (hypertension) 11/18/2016  . Diabetes mellitus with complication ([place]) 54/65/6812  . Thrombocytopenia ([place]) 11/18/2016  . Bilateral lower extremity edema 11/12/2016  . Diabetic polyneuropathy associated with diabetes mellitus due to underlying condition ([place]) 05/29/2015  . Chronic systolic CHF (congestive heart failure) ([place]) 02/05/2014  . Medtronic biventricular ICD, serial number  [id number]  02/05/2014  . CAD (coronary artery disease), native coronary artery 01/08/2014  . Chronic renal disease, stage III ([place]) 01/08/2014  . Mitral regurgitation 04/17/2013  . Atrial flutter ([place]) 06/03/2012  . Acute kidney injury (nontraumatic) ([place]) 05/31/2012  .  Chronic anticoagulation   . Nausea with vomiting 05/18/2012  . PAF (paroxysmal atrial fibrillation) ([place]) 05/17/2012  . Orthopnea 05/17/2012  . TIA (transient ischemic attack) 05/29/2010  . CHEST PAIN 11/13/2008  . VENTRICULAR TACHYCARDIA 08/14/2008  . Automatic implantable cardioverter-defibrillator in situ 08/14/2008  . Type 2 diabetes with nephropathy ([place]) 08/11/2008  . DYSLIPIDEMIA 08/11/2008  . Cardiomyopathy, ischemic 08/11/2008  . Disorder resulting from impaired renal function 08/11/2008  . HYPERCHOLESTEROLEMIA 04/29/2006  . MYOCARDIAL INFARCTION, OLD 04/29/2006  . NEPHROLITHIASIS 04/29/2006    Current Outpatient Medications:  .  ascorbic acid (VITAMIN C) 500 MG tablet, Take 1 tablet (500 mg total) by mouth 2 (two) times daily., Disp: 100 tablet, Rfl: 0 .  aspirin 81 MG chewable tablet, Chew 1 tablet (81 mg total) by mouth daily., Disp: , Rfl:  .  gabapentin (NEURONTIN) 100 MG capsule, Take 1 capsule (100 mg total) by mouth at bedtime. (Patient taking differently: Take 300 mg by mouth at bedtime. ), Disp: 90 capsule, Rfl: 10 .  Insulin Isophane & Regular Human (NOVOLIN 70/30 FLEXPEN) (70-30) 100 UNIT/ML PEN, Inject into the skin daily., Disp: , Rfl:  .  Insulin Pen Needle (COMFORT EZ PEN NEEDLES) 32G X 6 MM MISC, 1 application by Does not apply route at bedtime., Disp: 100 each, Rfl: 0 .  magnesium oxide (MAG-OX) 400 (241.3 Mg) MG tablet, Take 1 tablet (400 mg total) by mouth 2 (two) times daily., Disp: 60 tablet, Rfl: 0 .  Maltodextrin-Xanthan Gum (RESOURCE THICKENUP CLEAR) POWD, Take 1 g by mouth as needed., Disp: 1  Can, Rfl: 0 .  Multiple Vitamin (MULTIVITAMIN WITH MINERALS) TABS tablet, Take 1 tablet by mouth daily., Disp: , Rfl:  .  potassium chloride (K-DUR) 10 MEQ tablet, Take 1 tablet (10 mEq total) by mouth every Monday, Wednesday, and Friday., Disp: 90 tablet, Rfl: 3 .  protein supplement shake (PREMIER PROTEIN) LIQD, Take 325 mLs (11 oz total) by mouth 2 (two) times  daily between meals., Disp: , Rfl: 0 .  torsemide (DEMADEX) 20 MG tablet, Take 2 tablets (40 mg total) by mouth every Monday, Wednesday, and Friday., Disp: 90 tablet, Rfl: 0 .  warfarin (COUMADIN) 2.5 MG tablet, Take 1 tablet (2.5 mg) daily except 2 tablets (5 mg) on Monday OR AS DIRECTED BY HF CLINIC, Disp: 32 tablet, Rfl: 5 .  spironolactone (ALDACTONE) 25 MG tablet, Take 1 tablet (25 mg total) by mouth daily. (Patient not taking: Reported on 10/29/2017), Disp: 30 tablet, Rfl: 11 Allergies  Allergen Reactions  . Penicillins Hives, Itching and Other (See Comments)    Has patient had a PCN reaction causing immediate rash, facial/tongue/throat swelling, SOB or lightheadedness with hypotension:# # Yes # # Has patient had a PCN reaction causing severe rash involving mucus membranes or skin necrosis: No Has patient had a PCN reaction that required hospitalization: No Has patient had a PCN reaction occurring within the last 10 years: No If all of the above answers are "NO", then may proceed with Cephalosporin use.      Social History   Socioeconomic History  . Marital status: Married    Spouse name: Not on file  . Number of children: 4  . Years of education: 58  . Highest education level: Not on file  Occupational History  . Occupation: Psychiatrist - currently unemployed  Social Needs  . Financial resource strain: Not very hard  . Food insecurity:    Worry: Never true    Inability: Never true  . Transportation needs:    Medical: No    Non-medical: No  Tobacco Use  . Smoking status: Former Smoker    Packs/day: 1.00    Years: 29.00    Pack years: 29.00    Types: Cigarettes    Last attempt to quit: 03/02/2000    Years since quitting: 17.6  . Smokeless tobacco: Never Used  Substance and Sexual Activity  . Alcohol use: No    Alcohol/week: 0.0 standard drinks  . Drug use: No  . Sexual activity: Yes    Partners: Female    Birth control/protection: None  Lifestyle  . Physical  activity:    Days per week: Patient refused    Minutes per session: Patient refused  . Stress: Only a little  Relationships  . Social connections:    Talks on phone: Patient refused    Gets together: Patient refused    Attends religious service: Patient refused    Active member of club or organization: Patient refused    Attends meetings of clubs or organizations: Patient refused    Relationship status: Patient refused  . Intimate partner violence:    Fear of current or ex partner: Patient refused    Emotionally abused: Patient refused    Physically abused: Patient refused    Forced sexual activity: Patient refused  Other Topics Concern  . Not on file  Social History Narrative   Pt lives with wife in a one story home - married for 69 years. He hunts regularly and is active, walking > 1 mile at times without difficulty.  Has 4 children.     Retired Horticulturist, commercial.  Education: some college.    Physical Exam  Constitutional: He is oriented to person, place, and time.  Pulmonary/Chest: Effort normal and breath sounds normal.  Abdominal: Soft. Bowel sounds are normal.  Musculoskeletal: Normal range of motion. He exhibits edema.  Neurological: He is alert and oriented to person, place, and time.  Skin: Skin is warm and dry.  Psychiatric: He has a normal mood and affect.        Future Appointments  Date Time Provider [place]  11/04/2017  9:45 AM [facility] DEVICE REMOTES [facility]  11/12/2017  8:30 AM [name], NP [facility]  11/15/2017 10:00 AM [facility] VAD CLINIC [facility] None  11/24/2017 11:30 AM Narda Amber K, DO [facility] None  12/09/2017  8:30 AM MC-CARDIAC PHASE II ORIENT [facility]-RE[facility] None  12/13/2017 11:15 AM [facility] PHASE II EXC [facility] None  12/15/2017 11:15 AM [facility] PHASE II EXC [facility] None  12/17/2017 11:15 AM [facility] PHASE II EXC [facility] None  12/20/2017 11:15 AM [facility] PHASE II EXC [facility] None  12/22/2017 11:15 AM  [facility] PHASE II EXC [facility] None  12/24/2017 11:15 AM [facility] PHASE II EXC [facility] None  12/27/2017 11:15 AM [facility] PHASE II EXC [facility] None  12/29/2017 11:15 AM [facility] PHASE II EXC [facility] None  12/31/2017 11:15 AM [facility] PHASE II EXC [facility] None  01/03/2018 11:15 AM [facility] PHASE II EXC [facility] None  01/05/2018 11:15 AM [facility] PHASE II EXC [facility] None  01/07/2018 11:15 AM [facility] PHASE II EXC [facility] None  01/10/2018 11:15 AM [facility] PHASE II EXC [facility] None  01/12/2018 11:15 AM MC-[facility] PHASE II EXC [facility] None  01/14/2018 11:15 AM [facility] PHASE II EXC [facility] None  01/17/2018 11:15 AM [facility] PHASE II EXC [facility] None  01/19/2018 11:15 AM [facility] PHASE II EXC [facility] None  01/21/2018 11:15 AM [facility] PHASE II EXC [facility] None  01/24/2018 11:15 AM [facility] PHASE II EXC [facility] None  01/26/2018 11:15 AM [facility] PHASE II EXC [facility] None  01/28/2018 11:15 AM [facility] PHASE II EXC [facility] None  01/31/2018 11:15 AM [facility] PHASE II EXC [facility] None  02/02/2018 11:15 AM [facility] PHASE II EXC [facility] None  02/04/2018 11:15 AM [facility] PHASE II EXC [facility] None  02/07/2018 11:15 AM [facility] PHASE II EXC [facility] None  02/09/2018 11:15 AM [facility] PHASE II EXC [facility] None  02/11/2018 11:15 AM [facility] PHASE II EXC [facility] None  02/14/2018 11:15 AM [facility] PHASE II EXC [facility] None  02/16/2018 11:15 AM [facility] PHASE II EXC [facility] None  02/18/2018 11:15 AM [facility] PHASE II EXC [facility] None  02/21/2018 11:15 AM [facility] PHASE II EXC [facility] None  02/25/2018 11:15 AM [facility] PHASE II EXC [facility] None  02/28/2018 11:15 AM [facility] PHASE II EXC [facility] None  03/04/2018 11:15 AM [facility] PHASE II EXC [facility] None  03/07/2018 11:15 AM MC-CRE[facility] PHASE II EXC [facility] None  03/09/2018 11:15 AM MC-CRE[facility] PHASE II EXC [facility] None  03/11/2018 11:15 AM MC-CRE[facility] PHASE II EXC [facility] None  03/14/2018 11:15 AM [facility] PHASE II EXC [facility] None  03/16/2018  11:15 AM [facility] PHASE II EXC [facility] None   BP 99/73 (BP Location: Left Arm, Patient Position: Sitting, Cuff Size: Large)   Resp 16   Wt 216 lb (98 kg)   SpO2 95%   BMI 30.99 kg/m  Weight yesterday- 218 lb Last visit weight-    HUM- Yes ALARMS- No NOSEBLEEDS- No URINE COLOR- Yellow STOOL COLOR- [place] Shark  Mr Swiderski was  seen at home today and reported feeling well. He denied SOB, headache, dizziness or orthopnea. He has been compliant with his medications which were verified during my visit. He reports that he has been gradually increasing his activity and walked two laps to the mailbox today (approximately 200 feet total). 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 We decided to decrease visits to once per month until her "gets a little more comfortable." I will relay this to the clinic. Nothing further was needed during the time of our visit.   Jacquiline Doe, EMT 10/29/17  ACTION: Home visit completed Next visit planned for 3 week

## 2017-11-02 ENCOUNTER — Other Ambulatory Visit (HOSPITAL_COMMUNITY): Payer: PPO

## 2017-11-02 ENCOUNTER — Ambulatory Visit (HOSPITAL_COMMUNITY): Payer: Self-pay | Admitting: Pharmacist

## 2017-11-02 ENCOUNTER — Telehealth (HOSPITAL_COMMUNITY): Payer: Self-pay | Admitting: *Deleted

## 2017-11-02 DIAGNOSIS — Z7901 Long term (current) use of anticoagulants: Secondary | ICD-10-CM

## 2017-11-02 LAB — POCT INR: INR: 1.8 — AB (ref 2.0–3.0)

## 2017-11-02 NOTE — Telephone Encounter (Signed)
Spoke with [name] regarding phone call I received this morning form Morey Hummingbird (home health RN) regarding 7 lb weight gain since Aug 29th. Instructed her to have Josph Macho take 1 extra dose of Torsamide today. Instructed to continue daily weight, and if weight still continues to increase to page and let us know. She verbalized understanding.   Emerson Monte RN [place] Coordinator  Office: [phone]  24/7 Pager: [phone]

## 2017-11-04 ENCOUNTER — Telehealth: Payer: Self-pay | Admitting: Cardiology

## 2017-11-04 ENCOUNTER — Ambulatory Visit (INDEPENDENT_AMBULATORY_CARE_PROVIDER_SITE_OTHER): Payer: PPO

## 2017-11-04 DIAGNOSIS — Z95810 Presence of automatic (implantable) cardiac defibrillator: Secondary | ICD-10-CM

## 2017-11-04 DIAGNOSIS — I5022 Chronic systolic (congestive) heart failure: Secondary | ICD-10-CM

## 2017-11-04 NOTE — Telephone Encounter (Signed)
Spoke with pt and reminded pt of remote transmission that is due today. Pt verbalized understanding.   

## 2017-11-05 NOTE — Progress Notes (Signed)
EPIC Encounter for ICM Monitoring  Patient Name: [name] [name] is a 67 y.o. male Date: 11/05/2017 Primary Care Physican: [name], NP Primary Cardiologist:[name] Electrophysiologist: Druscilla Brownie Weight:218lbs   Clinical Status (05-Oct-2017 to 05-Nov-2017)  Treated VT/VF 0 episodes   AT/AF 1 episode   Time in AT/AF 24.0 hr/day (100.0%) Observations (3) (05-Oct-2017 to 05-Nov-2017)  AT/AF >= 6 hr for 32 days.  V. Pacing less than 90%.  Patient Activity less than 1 hr/day for 4 weeks.      Heart Failure questions reviewed, pt symptomatic with 7 lb weight gain in a week but dropped 4 lbs of the 7 lbs he gained.  Patient reported he has Torsemide 100 mg tablets from a previous prescription he is using up and taking 1/2 tablet = 50 mg Monday, Wed and Friday.   Since he had gained weight he took extra 50 mg Thursday 9/5 and will take 50 mg tomorrow 9/7.   Patient is prescribed as of 8/14 Torsemide 20 mg take 2 tablets (40 mg total) Mon, Wed and Friday.     Thoracic impedance abnormal suggesting fluid accumulation starting 10/05/2017.  Prescribed dosage: Torsemide 20 mgTake 2 tablets (40 mg total) by mouth every Monday, Wednesday, and Friday.  Taking differently, see above information regarding dosage.   Labs: 10/26/2017 Creatinine 1.36, BUN 23, Potassium 3.6, Sodium 138, EGFR >60 10/12/2017 Creatinine 1.09, BUN 19, Potassium 4.0, Sodium 136, EGFR >60 10/07/2017 Creatinine 1.11, BUN 20, Potassium 4.8, Sodium 133, EGFR >60 10/04/2017 Creatinine 1.38, BUN 25, Potassium 5.5, Sodium 133, EGFR 51-60 09/23/2017 Creatinine 1.02, BUN 15, Potassium 3.9, Sodium 133, EGFR >60 A complete set of results can be found in results review.  Recommendations:   Explained he should be taking the newest Torsemide 20 mg prescription with instructions to take 40 mg Mon, Wed and Friday.  Advised would inform Dr [name] how he is taking Torsemide.     Follow-up plan: ICM clinic  phone appointment on 11/09/2017 to recheck fluid levels.     Copy of ICM check sent to Dr. [name] and Dr [name] for review and recommendations if needed.    3 month ICM trend: 11/05/2017    AT/AF   1 Year ICM trend:       [name], RN 11/05/2017 12:00 PM

## 2017-11-09 ENCOUNTER — Telehealth: Payer: Self-pay

## 2017-11-09 ENCOUNTER — Ambulatory Visit (HOSPITAL_COMMUNITY): Payer: Self-pay | Admitting: Pharmacist

## 2017-11-09 DIAGNOSIS — Z7901 Long term (current) use of anticoagulants: Secondary | ICD-10-CM

## 2017-11-09 LAB — POCT INR: INR: 2.1 (ref 2.0–3.0)

## 2017-11-09 NOTE — Telephone Encounter (Signed)
Spoke with pt and reminded pt of remote transmission that is due today. Pt verbalized understanding.  For monthly check up with Margarita Grizzle.

## 2017-11-11 ENCOUNTER — Encounter: Payer: Self-pay | Admitting: Nurse Practitioner

## 2017-11-11 ENCOUNTER — Ambulatory Visit (INDEPENDENT_AMBULATORY_CARE_PROVIDER_SITE_OTHER): Payer: PPO | Admitting: Nurse Practitioner

## 2017-11-11 VITALS — HR 82 | Ht 70.0 in | Wt 227.0 lb

## 2017-11-11 DIAGNOSIS — G629 Polyneuropathy, unspecified: Secondary | ICD-10-CM | POA: Insufficient documentation

## 2017-11-11 DIAGNOSIS — E118 Type 2 diabetes mellitus with unspecified complications: Secondary | ICD-10-CM | POA: Diagnosis not present

## 2017-11-11 LAB — POCT GLYCOSYLATED HEMOGLOBIN (HGB A1C): HEMOGLOBIN A1C: 6.3 % — AB (ref 4.0–5.6)

## 2017-11-11 NOTE — Assessment & Plan Note (Signed)
Discussed increasing gabapentin to 300 daily to see if this helps neuropathic pain Continue F/U with neurology as planned

## 2017-11-11 NOTE — Assessment & Plan Note (Signed)
We discussed risks of hypoglycemia with insulin use, need to eat three meals a day and routinely monitor blood sugar readings Patient and wife verbalize understanding Continue novolin at current dosage rtc in about 1 month for F/U- repeat A1c

## 2017-11-11 NOTE — Assessment & Plan Note (Signed)
Swallow study ordered As requested for follow up - DG Swallowing Func-Speech Pathology; Future - SLP modified barium swallow; Future

## 2017-11-11 NOTE — Assessment & Plan Note (Signed)
Stable Continue to monitor Recent BMET by cardiology

## 2017-11-11 NOTE — Patient Instructions (Addendum)
Please continue your novolin at current dosage.  Please continue to eat 3 meals a day  Please continue to monitor your glucose readings daily, and let me know if you are getting any low readings less than 80 or high above 200.   Diabetes Mellitus and Nutrition When you have diabetes (diabetes mellitus), it is very important to have healthy eating habits because your blood sugar (glucose) levels are greatly affected by what you eat and drink. Eating healthy foods in the appropriate amounts, at about the same times every day, can help you:  Control your blood glucose.  Lower your risk of heart disease.  Improve your blood pressure.  Reach or maintain a healthy weight.  Every person with diabetes is different, and each person has different needs for a meal plan. Your health care provider may recommend that you work with a diet and nutrition specialist (dietitian) to make a meal plan that is best for you. Your meal plan may vary depending on factors such as:  The calories you need.  The medicines you take.  Your weight.  Your blood glucose, blood pressure, and cholesterol levels.  Your activity level.  Other health conditions you have, such as heart or kidney disease.  How do carbohydrates affect me? Carbohydrates affect your blood glucose level more than any other type of food. Eating carbohydrates naturally increases the amount of glucose in your blood. Carbohydrate counting is a method for keeping track of how many carbohydrates you eat. Counting carbohydrates is important to keep your blood glucose at a healthy level, especially if you use insulin or take certain oral diabetes medicines. It is important to know how many carbohydrates you can safely have in each meal. This is different for every person. Your dietitian can help you calculate how many carbohydrates you should have at each meal and for snack. Foods that contain carbohydrates include:  Bread, cereal, rice, pasta, and  crackers.  Potatoes and corn.  Peas, beans, and lentils.  Milk and yogurt.  Fruit and juice.  Desserts, such as cakes, cookies, ice cream, and candy.  How does alcohol affect me? Alcohol can cause a sudden decrease in blood glucose (hypoglycemia), especially if you use insulin or take certain oral diabetes medicines. Hypoglycemia can be a life-threatening condition. Symptoms of hypoglycemia (sleepiness, dizziness, and confusion) are similar to symptoms of having too much alcohol. If your health care provider says that alcohol is safe for you, follow these guidelines:  Limit alcohol intake to no more than 1 drink per day for nonpregnant women and 2 drinks per day for men. One drink equals 12 oz of beer, 5 oz of wine, or 1 oz of hard liquor.  Do not drink on an empty stomach.  Keep yourself hydrated with water, diet soda, or unsweetened iced tea.  Keep in mind that regular soda, juice, and other mixers may contain a lot of sugar and must be counted as carbohydrates.  What are tips for following this plan? Reading food labels  Start by checking the serving size on the label. The amount of calories, carbohydrates, fats, and other nutrients listed on the label are based on one serving of the food. Many foods contain more than one serving per package.  Check the total grams (g) of carbohydrates in one serving. You can calculate the number of servings of carbohydrates in one serving by dividing the total carbohydrates by 15. For example, if a food has 30 g of total carbohydrates, it would be equal  to 2 servings of carbohydrates.  Check the number of grams (g) of saturated and trans fats in one serving. Choose foods that have low or no amount of these fats.  Check the number of milligrams (mg) of sodium in one serving. Most people should limit total sodium intake to less than 2,300 mg per day.  Always check the nutrition information of foods labeled as "low-fat" or "nonfat". These foods  may be higher in added sugar or refined carbohydrates and should be avoided.  Talk to your dietitian to identify your daily goals for nutrients listed on the label. Shopping  Avoid buying canned, premade, or processed foods. These foods tend to be high in fat, sodium, and added sugar.  Shop around the outside edge of the grocery store. This includes fresh fruits and vegetables, bulk grains, fresh meats, and fresh dairy. Cooking  Use low-heat cooking methods, such as baking, instead of high-heat cooking methods like deep frying.  Cook using healthy oils, such as olive, canola, or sunflower oil.  Avoid cooking with butter, cream, or high-fat meats. Meal planning  Eat meals and snacks regularly, preferably at the same times every day. Avoid going long periods of time without eating.  Eat foods high in fiber, such as fresh fruits, vegetables, beans, and whole grains. Talk to your dietitian about how many servings of carbohydrates you can eat at each meal.  Eat 4-6 ounces of lean protein each day, such as lean meat, chicken, fish, eggs, or tofu. 1 ounce is equal to 1 ounce of meat, chicken, or fish, 1 egg, or 1/4 cup of tofu.  Eat some foods each day that contain healthy fats, such as avocado, nuts, seeds, and fish. Lifestyle   Check your blood glucose regularly.  Exercise at least 30 minutes 5 or more days each week, or as told by your health care provider.  Take medicines as told by your health care provider.  Do not use any products that contain nicotine or tobacco, such as cigarettes and e-cigarettes. If you need help quitting, ask your health care provider.  Work with a Social worker or diabetes educator to identify strategies to manage stress and any emotional and social challenges. What are some questions to ask my health care provider?  Do I need to meet with a diabetes educator?  Do I need to meet with a dietitian?  What number can I call if I have questions?  When are the  best times to check my blood glucose? Where to find more information:  American Diabetes Association: diabetes.org/food-and-fitness/food  Academy of Nutrition and Dietetics: PokerClues.dk  [employer] of Diabetes and Digestive and Kidney Diseases (NIH): ContactWire.be Summary  A healthy meal plan will help you control your blood glucose and maintain a healthy lifestyle.  Working with a diet and nutrition specialist (dietitian) can help you make a meal plan that is best for you.  Keep in mind that carbohydrates and alcohol have immediate effects on your blood glucose levels. It is important to count carbohydrates and to use alcohol carefully. This information is not intended to replace advice given to you by your health care provider. Make sure you discuss any questions you have with your health care provider. Document Released: 11/13/2004 Document Revised: 03/23/2016 Document Reviewed: 03/23/2016 Elsevier Interactive Patient Education  [employer].

## 2017-11-11 NOTE — Assessment & Plan Note (Signed)
Improving Ambulatory with cane today Continue PT

## 2017-11-11 NOTE — Assessment & Plan Note (Signed)
Continue current medications Continue scheduled F/U with cardiollogy

## 2017-11-11 NOTE — Assessment & Plan Note (Signed)
A1c reflects adequate glucose control Continue novolin at current dosage Continue to monitor glucose readings at home RTC in 3 months for F/U- repeat A1c, or sooner for high or low readings  Additional education provided on AVS - POCT HgB A1C-6.3

## 2017-11-11 NOTE — Assessment & Plan Note (Signed)
Continue scheduled F/U with cardiology

## 2017-11-11 NOTE — Assessment & Plan Note (Signed)
Improving Continue follow up with wound care

## 2017-11-11 NOTE — Assessment & Plan Note (Signed)
Continue current medications Continue scheduled F/U with cardiology

## 2017-11-11 NOTE — Progress Notes (Signed)
Name: [name] [name]   MRN: [id number]    DOB: 04-24-1950   Date:11/11/2017       Progress Note  Subjective  Chief Complaint Follow up  HPI Aside from continuing regular follow up with LVAD clinic since our last office visit on 10/15/17, [name] [name] also established care with urology for further evaluation of prostate abnormality noted during his recent hospitalization, he says urology did not order any further workup at this time and hell see them back in 6 months for follow up.  Diabetes- maintained on novolin 70/30 - 30 units every am. He has continued to check his glucose readings daily at home with recent numbers 86-140  fasting and after meals. Denies any episodes of hypoglycemia, no readings below 80. He denies syncope, confusion,  tremor, diaphoresis, polyuria, polydipsia, polyphagia. He says he is continuing to feel better since recent hospitalization and denies any complaints today  Lab Results  Component Value Date   HGBA1C 8.4 (H) 08/14/2017    Patient Active Problem List   Diagnosis Date Noted  . Long term (current) use of anticoagulants 09/29/2017  . Decubitus ulcer of sacral region, stage 2 09/24/2017  . Rectal mass   . Irregular prostate   . Sleep disturbance   . Dysphagia   . Diabetes mellitus type 2 in nonobese (HCC)   . Labile blood glucose   . Debility 09/10/2017  . Type 2 diabetes mellitus with retinopathy, with long-term current use of insulin ([place])   . Stage 3 chronic kidney disease ([place])   . Leukocytosis   . Pressure injury of skin 08/27/2017  . Metabolic alkalosis   . Presence of left ventricular assist device (LVAD) ([place])   . S/P TVR (tricuspid valve repair) 08/17/2017  . Goals of care, counseling/discussion   . Palliative care encounter   . Hx of atrial flutter 11/18/2016  . CAD (coronary artery disease)-RCA-T, 70% PL (off CFX), 99% Prox LAD/90% Dist LAD, S/P TAXUS stent x 2 11/18/2016  . HTN (hypertension) 11/18/2016  . Diabetes mellitus with  complication ([place]) 37/16/9678  . Thrombocytopenia ([place]) 11/18/2016  . Bilateral lower extremity edema 11/12/2016  . Diabetic polyneuropathy associated with diabetes mellitus due to underlying condition ([place]) 05/29/2015  . Chronic systolic CHF (congestive heart failure) ([place]) 02/05/2014  . Medtronic biventricular ICD, serial number  [id number]  02/05/2014  . CAD (coronary artery disease), native coronary artery 01/08/2014  . Chronic renal disease, stage III ([place]) 01/08/2014  . Mitral regurgitation 04/17/2013  . Atrial flutter ([place]) 06/03/2012  . Acute kidney injury (nontraumatic) ([place]) 05/31/2012  . Chronic anticoagulation   . Nausea with vomiting 05/18/2012  . PAF (paroxysmal atrial fibrillation) ([place]) 05/17/2012  . Orthopnea 05/17/2012  . TIA (transient ischemic attack) 05/29/2010  . CHEST PAIN 11/13/2008  . VENTRICULAR TACHYCARDIA 08/14/2008  . Automatic implantable cardioverter-defibrillator in situ 08/14/2008  . Type 2 diabetes with nephropathy ([place]) 08/11/2008  . DYSLIPIDEMIA 08/11/2008  . Cardiomyopathy, ischemic 08/11/2008  . Disorder resulting from impaired renal function 08/11/2008  . HYPERCHOLESTEROLEMIA 04/29/2006  . MYOCARDIAL INFARCTION, OLD 04/29/2006  . NEPHROLITHIASIS 04/29/2006    Past Surgical History:  Procedure Laterality Date  . ATRIAL FLUTTER ABLATION N/A 05/19/2012   Procedure: ATRIAL FLUTTER ABLATION;  Surgeon: Thompson Grayer, MD;  [facility] C[facility] LAB;  Service: Cardiovascular;  Laterality: N/A;  . BI-VENTRICULAR IMPLANTABLE CARDIOVERTER DEFIBRILLATOR UPGRADE N/A 02/05/2014   Procedure: BI-VENTRICULAR IMPLANTABLE CARDIOVERTER DEFIBRILLATOR UPGRADE;  Surgeon: Evans Lance, MD;  Location: [facility] CATH LAB;  Service: Cardiovascular;  Laterality: N/A;  . BIV ICD GENERTAOR CHANGE OUT  02/05/2014   Upgrade to Medtronic biventricular ICD, serial number  [id number] by Dr. [name]  . CARDIAC DEFIBRILLATOR PLACEMENT  2007    Medtronic Maximo VR, serial number [id number]   . INSERTION OF IMPLANTABLE LEFT VENTRICULAR ASSIST DEVICE N/A 08/16/2017   Procedure: INSERTION OF IMPLANTABLE LEFT VENTRICULAR ASSIST DEVICE-HM3;  Surgeon: Ivin Poot, MD;  Location: [place];  Service: Open Heart Surgery;  Laterality: N/A;  . IR FLUORO GUIDE CV LINE RIGHT  08/12/2017  . IR US GUIDE VASC ACCESS RIGHT  08/12/2017  . MEDIASTINAL EXPLORATION  08/17/2017   Procedure: MEDIASTINAL REXPLORATION with evacuation of hematoma;  Surgeon: Prescott Gum, Collier [place], MD;  Location: [facility] OR;  Service: Open Heart Surgery;;  . PERCUTANEOUS CORONARY STENT INTERVENTION (PCI-S)  January 2002   PTCA/Stent Distal RCA  . PERCUTANEOUS CORONARY STENT INTERVENTION (PCI-S)  June 2002   PTCA/Stent x 3 RCA, thrombolysis - failed  . PERCUTANEOUS CORONARY STENT INTERVENTION (PCI-S)  July 2006   TAXUS stents to prox and distal LAD  . RIGHT HEART CATH N/A 07/22/2017   Procedure: RIGHT HEART CATH;  Surgeon: Jolaine Artist, MD;  Location: [place] CV LAB;  Service: Cardiovascular;  Laterality: N/A;  . RIGHT HEART CATH N/A 08/13/2017   Procedure: RIGHT HEART CATH - swan;  Surgeon: Jolaine Artist, MD;  Location: [place] CV LAB;  Service: Cardiovascular;  Laterality: N/A;  . RIGHT/LEFT HEART CATH AND CORONARY ANGIOGRAPHY N/A 02/18/2017   Procedure: RIGHT/LEFT HEART CATH AND CORONARY ANGIOGRAPHY;  Surgeon: Jolaine Artist, MD;  Location: [place] CV LAB;  Service: Cardiovascular;  Laterality: N/A;  . STERNAL CLOSURE N/A 08/19/2017   Procedure: STERNAL CLOSURE;  Surgeon: Ivin Poot, MD;  Location: [place];  Service: Thoracic;  Laterality: N/A;  . TEE WITHOUT CARDIOVERSION N/A 08/16/2017   Procedure: TRANSESOPHAGEAL ECHOCARDIOGRAM (TEE);  Surgeon: Prescott Gum, Collier [place], MD;  Location: [place];  Service: Open Heart Surgery;  Laterality: N/A;  . TEE WITHOUT CARDIOVERSION N/A 08/19/2017   Procedure: TRANSESOPHAGEAL ECHOCARDIOGRAM (TEE);  Surgeon: Prescott Gum, Collier [place], MD;  Location: [place];  Service: Thoracic;   Laterality: N/A;  . TRICUSPID VALVE REPLACEMENT N/A 08/16/2017   Procedure: TRICUSPID VALVE REPAIR using Oletta Lamas MC3 Ring size 30;  Surgeon: Ivin Poot, MD;  Location: [place];  Service: Open Heart Surgery;  Laterality: N/A;    Family History  Problem Relation Age of Onset  . Heart failure Father        Deceased  . Alzheimer's disease Mother        Living  . Heart attack Mother   . CAD Brother   . Other Brother        blood cancer  . CAD Brother   . Prostate cancer Brother   . CAD Brother   . CAD Brother   . Healthy Son   . Healthy Daughter   . Diabetes Brother   . Stomach cancer Neg Hx   . Colon cancer Neg Hx     Social History   Socioeconomic History  . Marital status: Married    Spouse name: Not on file  . Number of children: 4  . Years of education: 2  . Highest education level: Not on file  Occupational History  . Occupation: Psychiatrist - currently unemployed  Social Needs  . Financial resource strain: Not very hard  . Food insecurity:    Worry: Never true    Inability:  Never true  . Transportation needs:    Medical: No    Non-medical: No  Tobacco Use  . Smoking status: Former Smoker    Packs/day: 1.00    Years: 29.00    Pack years: 29.00    Types: Cigarettes    Last attempt to quit: 03/02/2000    Years since quitting: 17.7  . Smokeless tobacco: Never Used  Substance and Sexual Activity  . Alcohol use: No    Alcohol/week: 0.0 standard drinks  . Drug use: No  . Sexual activity: Yes    Partners: Female    Birth control/protection: None  Lifestyle  . Physical activity:    Days per week: Patient refused    Minutes per session: Patient refused  . Stress: Only a little  Relationships  . Social connections:    Talks on phone: Patient refused    Gets together: Patient refused    Attends religious service: Patient refused    Active member of club or organization: Patient refused    Attends meetings of clubs or organizations: Patient refused     Relationship status: Patient refused  . Intimate partner violence:    Fear of current or ex partner: Patient refused    Emotionally abused: Patient refused    Physically abused: Patient refused    Forced sexual activity: Patient refused  Other Topics Concern  . Not on file  Social History Narrative   Pt lives with wife in a one story home - married for 31 years. He hunts regularly and is active, walking > 1 mile at times without difficulty.  Has 4 children.     Retired Horticulturist, commercial.  Education: some college.     Current Outpatient Medications:  .  ascorbic acid (VITAMIN C) 500 MG tablet, Take 1 tablet (500 mg total) by mouth 2 (two) times daily., Disp: 100 tablet, Rfl: 0 .  aspirin 81 MG chewable tablet, Chew 1 tablet (81 mg total) by mouth daily., Disp: , Rfl:  .  gabapentin (NEURONTIN) 100 MG capsule, Take 1 capsule (100 mg total) by mouth at bedtime. (Patient taking differently: Take 300 mg by mouth at bedtime. ), Disp: 90 capsule, Rfl: 10 .  Insulin Isophane & Regular Human (NOVOLIN 70/30 FLEXPEN) (70-30) 100 UNIT/ML PEN, Inject into the skin daily., Disp: , Rfl:  .  Insulin Pen Needle (COMFORT EZ PEN NEEDLES) 32G X 6 MM MISC, 1 application by Does not apply route at bedtime., Disp: 100 each, Rfl: 0 .  magnesium oxide (MAG-OX) 400 (241.3 Mg) MG tablet, Take 1 tablet (400 mg total) by mouth 2 (two) times daily., Disp: 60 tablet, Rfl: 0 .  Maltodextrin-Xanthan Gum (RESOURCE THICKENUP CLEAR) POWD, Take 1 g by mouth as needed., Disp: 1 Can, Rfl: 0 .  Multiple Vitamin (MULTIVITAMIN WITH MINERALS) TABS tablet, Take 1 tablet by mouth daily., Disp: , Rfl:  .  potassium chloride (K-DUR) 10 MEQ tablet, Take 1 tablet (10 mEq total) by mouth every Monday, Wednesday, and Friday., Disp: 90 tablet, Rfl: 3 .  protein supplement shake (PREMIER PROTEIN) LIQD, Take 325 mLs (11 oz total) by mouth 2 (two) times daily between meals., Disp: , Rfl: 0 .  spironolactone (ALDACTONE) 25 MG tablet, Take 1 tablet (25  mg total) by mouth daily., Disp: 30 tablet, Rfl: 11 .  torsemide (DEMADEX) 20 MG tablet, Take 2 tablets (40 mg total) by mouth every Monday, Wednesday, and Friday., Disp: 90 tablet, Rfl: 0 .  warfarin (COUMADIN) 2.5 MG tablet, Take 1  tablet (2.5 mg) daily except 2 tablets (5 mg) on Mon, Wed, Fri, Disp: , Rfl:   Allergies  Allergen Reactions  . Penicillins Hives, Itching and Other (See Comments)    Has patient had a PCN reaction causing immediate rash, facial/tongue/throat swelling, SOB or lightheadedness with hypotension:# # Yes # # Has patient had a PCN reaction causing severe rash involving mucus membranes or skin necrosis: No Has patient had a PCN reaction that required hospitalization: No Has patient had a PCN reaction occurring within the last 10 years: No If all of the above answers are "NO", then may proceed with Cephalosporin use.      ROS See HPI  Objective  Vitals:   11/11/17 1459  Pulse: 82  SpO2: 95%  Weight: 227 lb (103 kg)  Height: 5\' 10"  (1.778 m)   LVAD  Body mass index is 32.57 kg/m.  Physical Exam Constitutional: Patient appears well-developed and well-nourished. No distress.  HENT: Head: Normocephalic and atraumatic.  Nose: Nose normal. Mouth/Throat: Oropharynx is clear and moist. No oropharyngeal exudate.  Eyes: Conjunctivae and EOM are normal. Pupils are equal, round, and reactive to light. No scleral icterus.  Neck: Normal range of motion. Neck supple.  Cardiovascular: LVAD with mechanical heart sounds. Pulmonary/Chest: Effort normal and breath sounds normal. No respiratory distress. Neurological: he is alert and oriented to person, place, and time. No cranial nerve deficit. Coordination, balance, strength, speech, and gait are normal. Skin: Skin is warm and dry. No rash noted. No erythema.  Psychiatric: Patient has a normal mood and affect. behavior is normal. Judgment and thought content normal.   Assessment & Plan RTC in 3 months for F/U: DM-  repeat A1C

## 2017-11-12 ENCOUNTER — Ambulatory Visit: Payer: PPO | Admitting: Nurse Practitioner

## 2017-11-13 ENCOUNTER — Other Ambulatory Visit (HOSPITAL_COMMUNITY): Payer: Self-pay | Admitting: *Deleted

## 2017-11-13 DIAGNOSIS — Z7901 Long term (current) use of anticoagulants: Secondary | ICD-10-CM

## 2017-11-13 DIAGNOSIS — Z95811 Presence of heart assist device: Secondary | ICD-10-CM

## 2017-11-15 ENCOUNTER — Encounter (HOSPITAL_COMMUNITY): Payer: PPO

## 2017-11-16 ENCOUNTER — Ambulatory Visit (HOSPITAL_COMMUNITY): Payer: Self-pay | Admitting: Pharmacist

## 2017-11-16 LAB — POCT INR: INR: 3.9 — AB (ref 2.0–3.0)

## 2017-11-16 NOTE — Progress Notes (Signed)
No ICM remote transmission received for 11/08/2017 and next ICM transmission scheduled for 12/02/2017.

## 2017-11-18 ENCOUNTER — Ambulatory Visit (HOSPITAL_COMMUNITY): Payer: Self-pay | Admitting: Pharmacist

## 2017-11-18 DIAGNOSIS — Z7901 Long term (current) use of anticoagulants: Secondary | ICD-10-CM

## 2017-11-18 LAB — POCT INR: INR: 2.7 (ref 2.0–3.0)

## 2017-11-19 ENCOUNTER — Encounter (HOSPITAL_COMMUNITY): Payer: Self-pay

## 2017-11-19 ENCOUNTER — Other Ambulatory Visit (HOSPITAL_COMMUNITY): Payer: Self-pay

## 2017-11-19 ENCOUNTER — Ambulatory Visit (HOSPITAL_COMMUNITY)
Admission: RE | Admit: 2017-11-19 | Discharge: 2017-11-19 | Disposition: A | Discharge status: Home / Self Care | Payer: PPO | Source: Ambulatory Visit | Attending: Cardiology | Admitting: Cardiology

## 2017-11-19 VITALS — BP 116/64 | HR 95 | Wt 233.0 lb

## 2017-11-19 DIAGNOSIS — I255 Ischemic cardiomyopathy: Secondary | ICD-10-CM | POA: Insufficient documentation

## 2017-11-19 DIAGNOSIS — Z7982 Long term (current) use of aspirin: Secondary | ICD-10-CM | POA: Diagnosis not present

## 2017-11-19 DIAGNOSIS — Z9889 Other specified postprocedural states: Secondary | ICD-10-CM | POA: Diagnosis not present

## 2017-11-19 DIAGNOSIS — N183 Chronic kidney disease, stage 3 (moderate): Secondary | ICD-10-CM | POA: Insufficient documentation

## 2017-11-19 DIAGNOSIS — D649 Anemia, unspecified: Secondary | ICD-10-CM | POA: Insufficient documentation

## 2017-11-19 DIAGNOSIS — Z79899 Other long term (current) drug therapy: Secondary | ICD-10-CM | POA: Diagnosis not present

## 2017-11-19 DIAGNOSIS — J9690 Respiratory failure, unspecified, unspecified whether with hypoxia or hypercapnia: Secondary | ICD-10-CM | POA: Diagnosis not present

## 2017-11-19 DIAGNOSIS — E7800 Pure hypercholesterolemia, unspecified: Secondary | ICD-10-CM | POA: Insufficient documentation

## 2017-11-19 DIAGNOSIS — I4892 Unspecified atrial flutter: Secondary | ICD-10-CM | POA: Insufficient documentation

## 2017-11-19 DIAGNOSIS — Z7901 Long term (current) use of anticoagulants: Secondary | ICD-10-CM | POA: Insufficient documentation

## 2017-11-19 DIAGNOSIS — I5022 Chronic systolic (congestive) heart failure: Secondary | ICD-10-CM | POA: Diagnosis not present

## 2017-11-19 DIAGNOSIS — Z95811 Presence of heart assist device: Secondary | ICD-10-CM

## 2017-11-19 DIAGNOSIS — R490 Dysphonia: Secondary | ICD-10-CM | POA: Insufficient documentation

## 2017-11-19 DIAGNOSIS — Z955 Presence of coronary angioplasty implant and graft: Secondary | ICD-10-CM | POA: Diagnosis not present

## 2017-11-19 DIAGNOSIS — I130 Hypertensive heart and chronic kidney disease with heart failure and stage 1 through stage 4 chronic kidney disease, or unspecified chronic kidney disease: Secondary | ICD-10-CM | POA: Diagnosis not present

## 2017-11-19 DIAGNOSIS — Z95810 Presence of automatic (implantable) cardiac defibrillator: Secondary | ICD-10-CM | POA: Diagnosis not present

## 2017-11-19 DIAGNOSIS — I5082 Biventricular heart failure: Secondary | ICD-10-CM | POA: Insufficient documentation

## 2017-11-19 DIAGNOSIS — I10 Essential (primary) hypertension: Secondary | ICD-10-CM | POA: Diagnosis not present

## 2017-11-19 DIAGNOSIS — I4891 Unspecified atrial fibrillation: Secondary | ICD-10-CM | POA: Diagnosis not present

## 2017-11-19 DIAGNOSIS — R1310 Dysphagia, unspecified: Secondary | ICD-10-CM | POA: Diagnosis not present

## 2017-11-19 DIAGNOSIS — Z794 Long term (current) use of insulin: Secondary | ICD-10-CM | POA: Insufficient documentation

## 2017-11-19 DIAGNOSIS — I2510 Atherosclerotic heart disease of native coronary artery without angina pectoris: Secondary | ICD-10-CM | POA: Insufficient documentation

## 2017-11-19 DIAGNOSIS — E1122 Type 2 diabetes mellitus with diabetic chronic kidney disease: Secondary | ICD-10-CM | POA: Insufficient documentation

## 2017-11-19 DIAGNOSIS — E119 Type 2 diabetes mellitus without complications: Secondary | ICD-10-CM | POA: Diagnosis not present

## 2017-11-19 DIAGNOSIS — R5381 Other malaise: Secondary | ICD-10-CM | POA: Diagnosis not present

## 2017-11-19 LAB — COMPREHENSIVE METABOLIC PANEL
ALK PHOS: 131 U/L — AB (ref 38–126)
ALT: 22 U/L (ref 0–44)
ANION GAP: 11 (ref 5–15)
AST: 29 U/L (ref 15–41)
Albumin: 2.7 g/dL — ABNORMAL LOW (ref 3.5–5.0)
BUN: 30 mg/dL — ABNORMAL HIGH (ref 8–23)
CALCIUM: 9.1 mg/dL (ref 8.9–10.3)
CO2: 26 mmol/L (ref 22–32)
Chloride: 102 mmol/L (ref 98–111)
Creatinine, Ser: 1.13 mg/dL (ref 0.61–1.24)
GFR calc non Af Amer: >60 mL/min (ref >60)
Glucose, Bld: 234 mg/dL — ABNORMAL HIGH (ref 70–99)
POTASSIUM: 4 mmol/L (ref 3.5–5.1)
SODIUM: 139 mmol/L (ref 135–145)
Total Bilirubin: 1.2 mg/dL (ref 0.3–1.2)
Total Protein: 8.7 g/dL — ABNORMAL HIGH (ref 6.5–8.1)

## 2017-11-19 LAB — PROTIME-INR
INR: 2.67
PROTHROMBIN TIME: 28.2 s — AB (ref 11.4–15.2)

## 2017-11-19 LAB — CBC
HCT: 31.9 % — ABNORMAL LOW (ref 39.0–52.0)
Hemoglobin: 9.3 g/dL — ABNORMAL LOW (ref 13.0–17.0)
MCH: 23.4 pg — ABNORMAL LOW (ref 26.0–34.0)
MCHC: 29.2 g/dL — ABNORMAL LOW (ref 30.0–36.0)
MCV: 80.2 fL (ref 78.0–100.0)
Platelets: 227 10*3/uL (ref 150–400)
RBC: 3.98 MIL/uL — ABNORMAL LOW (ref 4.22–5.81)
RDW: 19.6 % — ABNORMAL HIGH (ref 11.5–15.5)
WBC: 5.9 10*3/uL (ref 4.0–10.5)

## 2017-11-19 LAB — PREALBUMIN: Prealbumin: 16.9 mg/dL — ABNORMAL LOW (ref 18–38)

## 2017-11-19 LAB — LACTATE DEHYDROGENASE: LDH: 215 U/L — ABNORMAL HIGH (ref 98–192)

## 2017-11-19 NOTE — Progress Notes (Signed)
Patient presents for 3 week follow up in [facility] today. Reports no problems with VAD equipment or concerns with drive line.   Pt states that he is doing Torsemide MWF and that he has been taking 20mg  on those days. Re-educated on need to take 40mg  on MWF.   Intermacs including [facility] 12, trail making, patient survey, and 6 minute walk completed.   Vital Signs:  Doppler Pressure: 84 Automatc BP: 116/64 (83) HR:  95 SPO2: 95 %  Weight: 233.0 lb w/ eqt Last weight: 225.8 lb w/o eqip Home weights: 217-220lbs   VAD Indication: Destination Therapy- Implanted 08/16/17   LVAD assessment: HM III:  Primary Controller  VAD Speed: 5300 rpms Flow: 4.0 Power: 3.8 w    PI: 3.5 Alarms: no clinical alarms  Events: rare PI events  Fixed speed: 5300 Low speed limit: 5000  Primary: Back up battery expiring in 30 months. Secondary: Back up battery expiring in  31 months    I reviewed the LVAD parameters from today and compared the results to the patient's prior recorded data. LVAD interrogation was NEGATIVE for significant power changes, NEGATIVE for clinical alarms and STABLE for PI events/speed drops. Hematocrit was updated, but no other changes were made and pump is functioning within specified parameters. Pt is performing daily controller and system monitor self tests along with completing weekly and monthly maintenance for LVAD equipment.  LVAD equipment check completed and is in good working order. Back-up equipment present. Performed self-test on equipment.   Annual Equipment Maintenance on UBC/PM was performed on 09/17/17.   Exit Site Care:  VAD dressing and anchor removed and site care performed using sterile technique. Drive line exit site cleaned with Chlora prep applicators x 2, allowed to dry, skin protectant applied and allowed to dry before gauze dressing and Aquacel silver strip re-applied. Exit site healing and partially incorporated.The velour is exposed 1/2 inch at  exit site. No redness, tenderness, or foul odor noted. Small amount of yellow drainage noted on gauze. Drive line anchor re-applied. Pt denies fever or chills. Advance to changing twice a week; Monday & Thursday.   Buttock Wound Care: [name] is  Changing dressing MWF on his buttock wounds with Aquacel and gauze. Removed dressing. Wiped area with with sterile saline wipes x2. Allowed to dry. Covered wound bed with Aquacel pad. Covered with ABD pad. Will continue MWF dressing changes.          Significant Events on VAD Support:    Device: Medtronic 3 Lead Therapies: on Last check: 10/17/16  Labs:   Hgb 9.3 - No S/S of bleeding. Specifically denies melena/BRBPR or nosebleeds.  LDH stable at 215  with a baseline ranging from 170-250.  Denies tea-colored urine. No power elevations noted on interrogation.   INR- 2.67 - within range of 2-2.5.  Plan: 1. 40mg  Lasix (2 tablets) MWF 2.Continue every other day buttock dressing changes.  3. Progress to Monday/Thusday dressing changes on drive line. Instructed [name] that if drainage increases, to increase frequency of dressing changes back to MWF.   4. Return to [place] clinic in 3 weeks.   Emerson Monte RN [place] Coordinator  Office: [phone]  24/7 Pager: [phone]

## 2017-11-19 NOTE — Progress Notes (Addendum)
Paramedicine Encounter   Patient ID: [name] [name] , male,   DOB: [birth year],67 y.o.,  MRN: [id number]  [name] [name] was seen in the VAD clinic today and reported feeling well. His wound is improving on his buttock. He has been taking torsemide at 20 mg three times per week but is to increase to 40 mg three times per week per clinic staff. I will follow up next week to make sure he is doing well with the change.   Jacquiline Doe, EMT 11/19/2017   ACTION: Next visit planned for 1 week

## 2017-11-19 NOTE — Patient Instructions (Addendum)
1. Take 40mg  (2 tablets) of Lasix Mon/Wed/Fri  2.  Coumadin 1mg  daily, except 2 tabs Mon/Wed/Fri

## 2017-11-19 NOTE — Progress Notes (Signed)
VAD Clinic Note   HF: Dr. [name]   HPI: [name] [name] is a 67 y.o. male with h/o chronic systolic CHF due to ICM, s/p LVAD HM3 08/16/2017, [name], [name], [name], [name], [name], HTN, HL, and CKD III.  Admitted 08/11/17 with recurrent A/C systolic CHF. Milrinone added, but mixed venous saturation remained low. Seen by CT surgery/VAD team and deemed appropriate for HMIII under DT criteria. He underwent HMIII on 6/17. Chest was unable to be closed due to high intrathoracic pressure and RV failure.  He returned to the OR on 6/18 for evacuation of hematoma. Chest was later closed on 6/20. Post operative course complicated by  open chest, RV failure, recurrent VT, respiratory failure, and dysphagia. CCM consulted for vent management with extubation completed on 08/26/17.  As he improved all drips gradually weaned off. Prior to d/c  ramp echo was completed with speed optimized.  Spent 7/11 - 7/27 in CIR. Underwent hydrotherapy for his buttock wound(s). 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    Follow up for Heart Failure/LVAD:  He presents today for 3 week follow up with his wife. He is feeling great. Voice is nearly back to normal. Denies SOB, orthopnea, or PND. Taking torsemide M/W/F but only taking 20 mg, not 40 as ordered. He is occasionally taking extra. No fevers or chills. Still has mild, occasional drainage from driveline. No bleeding, melena or neuro symptoms. No VAD alarms. Buttock wound much improved.   VAD Indication: Destination Therapy- Implanted 08/16/17   LVAD assessment: HM III:  Primary Controller  VAD Speed: 5300 rpms Flow: 4.0 Power: 3.8 PI: 3.5 Alarms: None Events: 0-5 PI events  Fixed speed: 5300 Low speed limit: 5000  Primary: Back up battery expiring in 30 months. Secondary: Back up battery expiring in 30 months   RHC  08/13/17 On milrinone 0.375 mcg/kg/min RA = 12 RV = 48/13 PA = 50/26 (35) PCW = 20 Fick cardiac output/index = 5.1/2.5 PVR = 3.0 Ao sat = 93% PA sat = 56%, 57% PAPI = 2.0 RA/PCWP = 0.60  R/LHC 01/2017  Prox RCA to Mid RCA lesion is 100% stenosed.  Prox RCA lesion is 100% stenosed.  Ost Cx to Prox Cx lesion is 40% stenosed.  Dist Cx lesion is 70% stenosed.  Ost 1st Mrg to 1st Mrg lesion is 40% stenosed.  1st Mrg lesion is 70% stenosed.  Prox LAD lesion is 50% stenosed.  Prox LAD to Mid LAD lesion is 60% stenosed.   Findings: Ao = 99/56 (73) LV = 101/5 RA = 1 RV = 29/1 PA = 23/11 (16) PCW = 6 Fick cardiac output/index = 3.8/1.7 Thermo CO/CI = 4.1/1.9 PVR = 2.6 Ao sat = 99% PA sat = 60%, 61%  Assessment: -3v CAD with 1) LAD diffuse 60% stenosis 2) LCX multiple 40% lesions 3) RCA chronically occluded proximally with L to R colllterals  Past Medical History:  Diagnosis Date  . AICD (automatic cardioverter/defibrillator) present 02/05/2014   Upgrade to Medtronic biventricular ICD, serial number  [id number]   . Atrial flutter ([place]) 04/2012   s/p TEE-EPS+RFCA 04/2012  . CAD (coronary artery disease) 4128,7867 X 2    RCA-T, 70% PL (off CFX), 99% Prox LAD/90% Dist LAD, S/P TAXUS stent x 2  . CHF (congestive heart failure) ([place])   . Chronic anticoagulation   . Chronic systolic  heart failure ([place])   . CKD (chronic kidney disease)   . Diabetic retinopathy ([place])   . DM type 2 (diabetes mellitus, type 2) ([facility])    insulin dependent  . HTN (hypertension)   . Hypercholesteremia    ablation  . ICD (implantable cardiac defibrillator) in place   . Ischemic cardiomyopathy March 2015   20-25% 2D   . Nephrolithiasis   . Ventricular tachycardia ([place])     Current Outpatient Medications  Medication Sig Dispense Refill  . ascorbic acid (VITAMIN C) 500 MG tablet Take 1 tablet (500 mg total) by mouth 2 (two) times daily. 100 tablet 0  . aspirin 81 MG chewable tablet  Chew 1 tablet (81 mg total) by mouth daily.    Marland Kitchen gabapentin (NEURONTIN) 100 MG capsule Take 1 capsule (100 mg total) by mouth at bedtime. (Patient taking differently: Take 300 mg by mouth at bedtime. ) 90 capsule 10  . Insulin Isophane & Regular Human (NOVOLIN 70/30 FLEXPEN) (70-30) 100 UNIT/ML PEN Inject into the skin daily.    . Insulin Pen Needle (COMFORT EZ PEN NEEDLES) 32G X 6 MM MISC 1 application by Does not apply route at bedtime. 100 each 0  . magnesium oxide (MAG-OX) 400 (241.3 Mg) MG tablet Take 1 tablet (400 mg total) by mouth 2 (two) times daily. 60 tablet 0  . Multiple Vitamin (MULTIVITAMIN WITH MINERALS) TABS tablet Take 1 tablet by mouth daily.    . potassium chloride (K-DUR) 10 MEQ tablet Take 1 tablet (10 mEq total) by mouth every Monday, Wednesday, and Friday. 90 tablet 3  . protein supplement shake (PREMIER PROTEIN) LIQD Take 325 mLs (11 oz total) by mouth 2 (two) times daily between meals.  0  . spironolactone (ALDACTONE) 25 MG tablet Take 1 tablet (25 mg total) by mouth daily. 30 tablet 11  . torsemide (DEMADEX) 20 MG tablet Take 2 tablets (40 mg total) by mouth every Monday, Wednesday, and Friday. 90 tablet 0  . warfarin (COUMADIN) 2.5 MG tablet Take 1 tablet (2.5 mg) daily except 2 tablets (5 mg) on Mon, Wed, Fri    . Maltodextrin-Xanthan Gum (RESOURCE THICKENUP CLEAR) POWD Take 1 g by mouth as needed. (Patient not taking: Reported on 11/19/2017) 1 Can 0   No current facility-administered medications for this encounter.     Penicillins  Review of systems complete and found to be negative unless listed in HPI.    Vitals:   11/19/17 1017 11/19/17 1027  BP: (!) 84/0 116/64  Pulse: 95   Weight: 105.7 kg (233 lb)     Wt Readings from Last 3 Encounters:  11/19/17 105.7 kg (233 lb)  11/11/17 103 kg (227 lb)  10/29/17 98 kg (216 lb)    Vital Signs:  Doppler Pressure:84 Automatc BP: 116/64 (83) HR: 95 SPO2: Not obtained.   Weight: 233 lbs w/ eqt Last weight: 225  lbs w/o equip  Physical Exam: General: Well appearing this am. NAD.  HEENT: Normal. Neck: Supple, JVP 7-8 cm. Carotids OK.  Cardiac:  Mechanical heart sounds with LVAD hum present.  Lungs:  CTAB, normal effort.  Abdomen:  NT, ND, no HSM. No bruits or masses. +BS  LVAD exit site:  Dressing dry and intact. Stabilization device present and accurately applied.  Extremities:  Warm and dry. No cyanosis, clubbing, or rash. Trace to 1+ edema.  Neuro:  Alert & oriented x 3. Cranial nerves grossly intact. Moves all 4 extremities w/o difficulty. Affect pleasant     ASSESSMENT  AND PLAN:  1. Chronic systolic CHF with biventricular failure-> cardiogenic shock: - Echo 08/13/2017 EF 20-25%.s/p Medtronic BiV ICD in place. Cath 12/18 with stable 1v CAD. s/p HM-3 implant 08/16/17.  - Continues to improve.  - NYHA II symptoms.  - Volume status mildly elevated.  - Increase torsemide to 40 mg M/W/F as ordered.  - Continue spiro 25 mg daily.  - Stop digoxin at next visit.  2. 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 - On aspirin and coumadin. No bleeding.  - INR goal 2-2.5 INR 2.67. Dosing reviewed with Pharm-D and VAD coordinator.  - Ramp ECHO completed 09/09/2017 with speed increased to 5300.  - VAD interrogated personally. Parameters stable.   - LDH 215 3. CKD III: - Creatinine stable 1.13 4. [name]/VF:  - Quiescent.  He is off amio.  - Recurrent VT on 6/22, VF 6/24, and VT 6/26 requiring emergent bedside cardioversion.  5. AFL/atrial fibrillation:  - S/p previous ablation. He is currently in atrial fibrillation.  -Rate controlled. Off amio. On coumadin as above.  6. CAD s/p PCI of RCA and GMW:NUUVOZ cath with stable CAD as above:  - No s/s of ischemia.    - Continue ASA and statin.  7. [name]: Recent A1c8.3 on 6/3. - Per PCP.  8. Anemia:  - Hgb stable at 9.3.  9. Deconditioning - Much improved. Encouraged him to go to CR 10. Buttock Pressure Ulcer: - Much improved. Continue wound care.  11.  Hoarseness - Nearly completely resolved.  12. Rectal Mass - CT reviewed with Radiology. Suspect no mass present  13. Driveline drainage - Improving. No signs of infection.   Doing great overall. Continued to encouraged increased activity. RTC 3 weeks.   Shirley Friar, PA-C  11/19/17   Greater than 50% of the 50 minute visit was spent in counseling/coordination of care regarding disease state education, salt/fluid restriction, sliding scale diuretics, and medication compliance.

## 2017-11-23 ENCOUNTER — Telehealth (HOSPITAL_COMMUNITY): Payer: Self-pay

## 2017-11-23 ENCOUNTER — Ambulatory Visit (HOSPITAL_COMMUNITY): Payer: Self-pay | Admitting: Pharmacist

## 2017-11-23 DIAGNOSIS — Z7901 Long term (current) use of anticoagulants: Secondary | ICD-10-CM

## 2017-11-23 LAB — POCT INR: INR: 2.6 (ref 2.0–3.0)

## 2017-11-23 NOTE — Progress Notes (Signed)
Follow-up Visit   Date: 11/24/17    [name] [name] MRN: [id number] DOB: 27-Sep-1950   Interim History: [name] [name] is a 67 y.o. right-handed African American male with insulin-dependent diabetes mellitus, atrial flutter, CAD, CHF s/p ICD, s/p LVAD (07/2017) hypertension, hyperlipidemia, and CKD returning to the clinic for follow-up of diabetic neuropathy.  The patient was accompanied to the clinic by wife.  History of present illness: Starting around October 2015 he was admitted with CHF exacerbation and developed severe swelling of the legs. Around the same time, he began noticing tingling pain involving the feet and lower calf which has gradually been worsening over the past year. It is worse after he has been walking all day and seem less painful in the morning. He cannot stand anyone touching his feet, because it irritates it.There is no numbness or weakness.  He endorses difficulty with balance, but has not fallen and walks independently. He has been diabetes for 15+ years and insulin-dependent for most of this time. He was started on gabapentin and has some mild transient improvement, but despite increasing the dose to 600mg  TID, there was no benefit.  He tried Lyrica 75mg  twice daily, but did not appreciate any benefit so switched back to gabapentin 600mg  twice daily.  Because of his cardiac comorbidities and prolonged QTc, avoid TCAs.  UPDATE 05/27/2016:   He suffered two falls in the early morning ~ 3am and does not know that he was awake, but only after falling realized that he was walking.  He is not sure if he was sleep walking and thinks he may have been going into the bathroom.  With his first fall, he suffered right lateral malleolus fracture and has been wearing a boot since 1/25.  He is taking Cymbalta 30mg  daily for his neuropathic pain, but does not feel it helps. He endorses vivid dreams and thinks the medication maybe making him lightheaded.  He is being  evaluated for LVAD.  UPDATE 11/23/2017:  He is here for follow-up visit for neuropathy.  He continues to have numbness and tingling over the feet and was recently started back on gabapentin 300mg  at bedtime, which provides some relief.  He has not had any falls and denies any weakness of the legs.  Balance is good.  He has LVAD placed in June 2019 as destination therapy.    Medications:  Current Outpatient Medications on File Prior to Visit  Medication Sig Dispense Refill  . ascorbic acid (VITAMIN C) 500 MG tablet Take 1 tablet (500 mg total) by mouth 2 (two) times daily. 100 tablet 0  . aspirin 81 MG chewable tablet Chew 1 tablet (81 mg total) by mouth daily.    Marland Kitchen gabapentin (NEURONTIN) 100 MG capsule Take 1 capsule (100 mg total) by mouth at bedtime. (Patient taking differently: Take 300 mg by mouth at bedtime. ) 90 capsule 10  . Insulin Pen Needle (COMFORT EZ PEN NEEDLES) 32G X 6 MM MISC 1 application by Does not apply route at bedtime. 100 each 0  . magnesium oxide (MAG-OX) 400 (241.3 Mg) MG tablet Take 1 tablet (400 mg total) by mouth 2 (two) times daily. 60 tablet 0  . Multiple Vitamin (MULTIVITAMIN WITH MINERALS) TABS tablet Take 1 tablet by mouth daily.    . potassium chloride (K-DUR) 10 MEQ tablet Take 1 tablet (10 mEq total) by mouth every Monday, Wednesday, and Friday. 90 tablet 3  . protein supplement shake (PREMIER PROTEIN) LIQD Take 325 mLs (11 oz  total) by mouth 2 (two) times daily between meals.  0  . spironolactone (ALDACTONE) 25 MG tablet Take 1 tablet (25 mg total) by mouth daily. 30 tablet 11  . torsemide (DEMADEX) 20 MG tablet Take 2 tablets (40 mg total) by mouth every Monday, Wednesday, and Friday. 90 tablet 0  . warfarin (COUMADIN) 2.5 MG tablet Take 1 tablet (2.5 mg) daily except 2 tablets (5 mg) on Mon, Wed, Fri     No current facility-administered medications on file prior to visit.     Allergies:  Allergies  Allergen Reactions  . Penicillins Hives, Itching and  Other (See Comments)    Has patient had a PCN reaction causing immediate rash, facial/tongue/throat swelling, SOB or lightheadedness with hypotension:# # Yes # # Has patient had a PCN reaction causing severe rash involving mucus membranes or skin necrosis: No Has patient had a PCN reaction that required hospitalization: No Has patient had a PCN reaction occurring within the last 10 years: No If all of the above answers are "NO", then may proceed with Cephalosporin use.     Review of Systems:  CONSTITUTIONAL: No fevers, chills, night sweats, or weight loss.  EYES: No visual changes or eye pain ENT: No hearing changes.  No history of nose bleeds.   RESPIRATORY: No cough, wheezing and shortness of breath.   CARDIOVASCULAR: Negative for chest pain, and palpitations.   GI: Negative for abdominal discomfort, blood in stools or black stools.  No recent change in bowel habits.   GU:  No history of incontinence.   MUSCLOSKELETAL: No history of joint pain or swelling.  No myalgias.   SKIN: Negative for lesions, rash, and itching.   ENDOCRINE: Negative for cold or heat intolerance, polydipsia or goiter.   PSYCH:  No depression or anxiety symptoms.   NEURO: As Above.   Vital Signs:  Pulse 80   Ht 5\' 10"  (1.778 m)   Wt 230 lb 8 oz (104.6 kg)   SpO2 93%   BMI 33.07 kg/m   General Medical Exam:   General:  Well appearing, comfortable  Eyes/ENT: see cranial nerve examination.   Neck: No masses appreciated.  Full range of motion without tenderness.  No carotid bruits. Respiratory:  Clear to auscultation, good air entry bilaterally.   Cardiac:  Continuous hum from LVAD Ext:  Mild edema in the legs     Neurological Exam: MENTAL STATUS including orientation to time, place, person, recent and remote memory, attention span and concentration, language, and fund of knowledge is normal.  Speech is not dysarthric.  CRANIAL NERVES:  Face is symmetric.  MOTOR: Motor strength is 5/5  throughout  SENSORY: Vibration is reduced at the ankles, intact at the knees  COORDINATION/GAIT:  Gait stable, unassisted.    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  Limited options for medications.   - Previously tried:  Lyrica (ineffective), Cymbalta (REM behavior disorder, sleep walking)  - Slowly increase gabapentin to 300mg  twice daily (titration schedule given). GFR >60, will need to follow renal function  - Avoid TCA due to prolonged QTc  - Consider  carbamazepine or topiramate going forward  Return to clinic 4 months   Thank you for allowing me to participate in patient's care.  If I can answer any additional questions, I would be pleased to do so.    Sincerely,    Donika K. Posey Pronto, DO

## 2017-11-24 ENCOUNTER — Encounter: Payer: Self-pay | Admitting: Neurology

## 2017-11-24 ENCOUNTER — Ambulatory Visit: Payer: PPO | Admitting: Neurology

## 2017-11-24 VITALS — HR 80 | Ht 70.0 in | Wt 230.5 lb

## 2017-11-24 DIAGNOSIS — M792 Neuralgia and neuritis, unspecified: Secondary | ICD-10-CM | POA: Diagnosis not present

## 2017-11-24 DIAGNOSIS — E0842 Diabetes mellitus due to underlying condition with diabetic polyneuropathy: Secondary | ICD-10-CM | POA: Diagnosis not present

## 2017-11-24 MED ORDER — GABAPENTIN 300 MG PO CAPS: 300.0000 mg | ORAL_CAPSULE | Freq: Two times a day (BID) | ORAL | 3 refills | Status: DC

## 2017-11-24 NOTE — Patient Instructions (Addendum)
Gabapentin instructions:  Day 1-3:  Morning: 100mg          Bedtime: 300 mg  Day 4-6:  Morning: 200 mg    Bedtime: 300 mg  Day 7-9:  Morning: 300 mg    Bedtime: 300 mg, and continue     Return to clinic 4 months

## 2017-11-25 ENCOUNTER — Telehealth (HOSPITAL_COMMUNITY): Payer: Self-pay

## 2017-11-25 DIAGNOSIS — I5043 Acute on chronic combined systolic (congestive) and diastolic (congestive) heart failure: Secondary | ICD-10-CM | POA: Diagnosis not present

## 2017-11-25 DIAGNOSIS — I5023 Acute on chronic systolic (congestive) heart failure: Secondary | ICD-10-CM | POA: Diagnosis not present

## 2017-11-25 DIAGNOSIS — I252 Old myocardial infarction: Secondary | ICD-10-CM | POA: Diagnosis not present

## 2017-11-25 DIAGNOSIS — E1121 Type 2 diabetes mellitus with diabetic nephropathy: Secondary | ICD-10-CM | POA: Diagnosis not present

## 2017-11-25 DIAGNOSIS — L89310 Pressure ulcer of right buttock, unstageable: Secondary | ICD-10-CM | POA: Diagnosis not present

## 2017-11-25 NOTE — Telephone Encounter (Signed)
I called [name] [name] to remind him of our appointment tomorrow. He did not answer to I left a voicemail asking him to call me back with a preferred time for me to arrive.

## 2017-11-26 ENCOUNTER — Other Ambulatory Visit (HOSPITAL_COMMUNITY): Payer: Self-pay

## 2017-11-26 NOTE — Progress Notes (Signed)
Paramedicine Encounter    Patient ID: [name] [name], male    DOB: 1950-08-26, 67 y.o.   MRN: [id number]   Patient Care Team: [name], NP as PCP - General (Nurse Practitioner) Evans Lance, MD as Consulting Physician (Cardiology) Alda Berthold, DO as Consulting Physician (Neurology) Bensimhon, Shaune Pascal, MD as Consulting Physician (Cardiology)  Patient Active Problem List   Diagnosis Date Noted  . Peripheral neuropathy 11/11/2017  . Long term (current) use of anticoagulants 09/29/2017  . Decubitus ulcer of sacral region, stage 2 09/24/2017  . Rectal mass   . Irregular prostate   . Sleep disturbance   . Dysphagia   . Diabetes mellitus type 2 in nonobese (HCC)   . Labile blood glucose   . Debility 09/10/2017  . Type 2 diabetes mellitus with retinopathy, with long-term current use of insulin ([place])   . Stage 3 chronic kidney disease ([place])   . Leukocytosis   . Pressure injury of skin 08/27/2017  . Metabolic alkalosis   . Presence of left ventricular assist device (LVAD) ([place])   . S/P TVR (tricuspid valve repair) 08/17/2017  . Goals of care, counseling/discussion   . Palliative care encounter   . Hx of atrial flutter 11/18/2016  . CAD (coronary artery disease)-RCA-T, 70% PL (off CFX), 99% Prox LAD/90% Dist LAD, S/P TAXUS stent x 2 11/18/2016  . HTN (hypertension) 11/18/2016  . Diabetes mellitus with complication ([place]) 09/38/1829  . Bilateral lower extremity edema 11/12/2016  . Diabetic polyneuropathy associated with diabetes mellitus due to underlying condition ([place]) 05/29/2015  . Chronic systolic CHF (congestive heart failure) ([place]) 02/05/2014  . Medtronic biventricular ICD, serial number  [id number]  02/05/2014  . CAD (coronary artery disease), native coronary artery 01/08/2014  . Chronic renal disease, stage III ([place]) 01/08/2014  . Mitral regurgitation 04/17/2013  . Atrial flutter ([name]) 06/03/2012  . Acute kidney injury (nontraumatic) ([place]) 05/31/2012  .  Chronic anticoagulation   . Nausea with vomiting 05/18/2012  . PAF (paroxysmal atrial fibrillation) ([place]) 05/17/2012  . Orthopnea 05/17/2012  . TIA (transient ischemic attack) 05/29/2010  . CHEST PAIN 11/13/2008  . VENTRICULAR TACHYCARDIA 08/14/2008  . Automatic implantable cardioverter-defibrillator in situ 08/14/2008  . Type 2 diabetes with nephropathy ([place]) 08/11/2008  . DYSLIPIDEMIA 08/11/2008  . Cardiomyopathy, ischemic 08/11/2008  . Disorder resulting from impaired renal function 08/11/2008  . HYPERCHOLESTEROLEMIA 04/29/2006  . MYOCARDIAL INFARCTION, OLD 04/29/2006  . NEPHROLITHIASIS 04/29/2006    Current Outpatient Medications:  .  ascorbic acid (VITAMIN C) 500 MG tablet, Take 1 tablet (500 mg total) by mouth 2 (two) times daily., Disp: 100 tablet, Rfl: 0 .  aspirin 81 MG chewable tablet, Chew 1 tablet (81 mg total) by mouth daily., Disp: , Rfl:  .  gabapentin (NEURONTIN) 100 MG capsule, Take 1 capsule (100 mg total) by mouth at bedtime. (Patient taking differently: Take 300 mg by mouth at bedtime. ), Disp: 90 capsule, Rfl: 10 .  Insulin Pen Needle (COMFORT EZ PEN NEEDLES) 32G X 6 MM MISC, 1 application by Does not apply route at bedtime., Disp: 100 each, Rfl: 0 .  magnesium oxide (MAG-OX) 400 (241.3 Mg) MG tablet, Take 1 tablet (400 mg total) by mouth 2 (two) times daily., Disp: 60 tablet, Rfl: 0 .  Multiple Vitamin (MULTIVITAMIN WITH MINERALS) TABS tablet, Take 1 tablet by mouth daily., Disp: , Rfl:  .  potassium chloride (K-DUR) 10 MEQ tablet, Take 1 tablet (10 mEq total) by mouth every Monday, Wednesday, and Friday.,  Disp: 90 tablet, Rfl: 3 .  protein supplement shake (PREMIER PROTEIN) LIQD, Take 325 mLs (11 oz total) by mouth 2 (two) times daily between meals., Disp: , Rfl: 0 .  spironolactone (ALDACTONE) 25 MG tablet, Take 1 tablet (25 mg total) by mouth daily., Disp: 30 tablet, Rfl: 11 .  torsemide (DEMADEX) 20 MG tablet, Take 2 tablets (40 mg total) by mouth every Monday,  Wednesday, and Friday., Disp: 90 tablet, Rfl: 0 .  warfarin (COUMADIN) 2.5 MG tablet, Take 1 tablet (2.5 mg) daily except 2 tablets (5 mg) on Mon, Wed, Fri, Disp: , Rfl:  .  gabapentin (NEURONTIN) 300 MG capsule, Take 1 capsule (300 mg total) by mouth 2 (two) times daily., Disp: 180 capsule, Rfl: 3 Allergies  Allergen Reactions  . Penicillins Hives, Itching and Other (See Comments)    Has patient had a PCN reaction causing immediate rash, facial/tongue/throat swelling, SOB or lightheadedness with hypotension:# # Yes # # Has patient had a PCN reaction causing severe rash involving mucus membranes or skin necrosis: No Has patient had a PCN reaction that required hospitalization: No Has patient had a PCN reaction occurring within the last 10 years: No If all of the above answers are "NO", then may proceed with Cephalosporin use.      Social History   Socioeconomic History  . Marital status: Married    Spouse name: Not on file  . Number of children: 4  . Years of education: 41  . Highest education level: Not on file  Occupational History  . Occupation: Psychiatrist - currently unemployed  Social Needs  . Financial resource strain: Not very hard  . Food insecurity:    Worry: Never true    Inability: Never true  . Transportation needs:    Medical: No    Non-medical: No  Tobacco Use  . Smoking status: Former Smoker    Packs/day: 1.00    Years: 29.00    Pack years: 29.00    Types: Cigarettes    Last attempt to quit: 03/02/2000    Years since quitting: 17.7  . Smokeless tobacco: Never Used  Substance and Sexual Activity  . Alcohol use: No    Alcohol/week: 0.0 standard drinks  . Drug use: No  . Sexual activity: Yes    Partners: Female    Birth control/protection: None  Lifestyle  . Physical activity:    Days per week: Patient refused    Minutes per session: Patient refused  . Stress: Only a little  Relationships  . Social connections:    Talks on phone: Patient refused     Gets together: Patient refused    Attends religious service: Patient refused    Active member of club or organization: Patient refused    Attends meetings of clubs or organizations: Patient refused    Relationship status: Patient refused  . Intimate partner violence:    Fear of current or ex partner: Patient refused    Emotionally abused: Patient refused    Physically abused: Patient refused    Forced sexual activity: Patient refused  Other Topics Concern  . Not on file  Social History Narrative   Pt lives with wife in a one story home - married for 51 years. He hunts regularly and is active, walking > 1 mile at times without difficulty.  Has 4 children.     Retired Horticulturist, commercial.  Education: some college.    Physical Exam  Constitutional: He is oriented to person, place, and time.  Pulmonary/Chest: Effort normal and breath sounds normal.  Abdominal: Soft.  Musculoskeletal: Normal range of motion. He exhibits edema.  Neurological: He is alert and oriented to person, place, and time.  Skin: Skin is warm and dry.  Psychiatric: He has a normal mood and affect.        Future Appointments  Date Time Provider [place]  12/02/2017 12:45 PM [facility] DEVICE REMOTES [facility]  12/09/2017  8:30 AM [facility] PHASE II ORIENT [facility] None  12/10/2017 10:00 AM [facility] VAD CLINIC [facility] None  12/13/2017 11:15 AM [facility] MONITOR 22 [facility] None  12/15/2017 11:15 AM [facility] MONITOR 22 [facility] None  12/17/2017 11:15 AM [facility] MONITOR 22 [facility] None  12/20/2017 11:15 AM [facility] MONITOR 22 [facility] None  12/22/2017 11:15 AM [facility] MONITOR 22 [facility] None  12/24/2017 11:15 AM [facility] MONITOR 22 [facility] None  12/27/2017 11:15 AM [facility] MONITOR 22 [facility] None  12/29/2017 11:15 AM [facility] MONITOR 22 [facility] None  12/31/2017 11:15 AM [facility] MONITOR 22 [facility] None  01/03/2018 11:15 AM [facility] MONITOR 22 [facility] None  01/05/2018 11:15 AM [facility]  MONITOR 22 [facility] None  01/07/2018 11:15 AM [facility] MONITOR 22 [facility] None  01/10/2018 11:15 AM [facility] MONITOR 22 [facility] None  01/12/2018 11:15 AM [facility] MONITOR 22 [facility] None  01/14/2018 11:15 AM [facility] MONITOR 22 [facility] None  01/17/2018 11:15 AM [facility] MONITOR 22 [facility] None  01/19/2018 11:15 AM [facility] MONITOR 22 [facility] None  01/21/2018 11:15 AM [facility] MONITOR 22 [facility] None  01/24/2018 11:15 AM [facility] MONITOR 22 [facility] None  01/26/2018 11:15 AM [facility] MONITOR 22 [facility] None  01/28/2018 11:15 AM [facility] MONITOR 22 [facility] None  01/31/2018 11:15 AM [facility] MONITOR 22 [facility] None  02/02/2018 11:15 AM [facility] MONITOR 22 [facility] None  02/04/2018 11:15 AM [facility] MONITOR 22 [facility] None  02/07/2018 11:15 AM [facility] MONITOR 22 [facility] None  02/09/2018 11:15 AM [facility] MONITOR 22 [facility] None  02/10/2018  9:30 AM [name], NP [facility]  02/11/2018 11:15 AM [facility] MONITOR 22 [facility] None  02/14/2018 11:15 AM [facility] MONITOR 22 [facility] None  02/16/2018 11:15 AM [facility] MONITOR 22 [facility] None  02/18/2018 11:15 AM [facility] MONITOR 22 [facility] None  02/21/2018 11:15 AM [facility] MONITOR 22 [facility] None  02/25/2018 11:15 AM [facility] MONITOR 22 [facility] None  02/28/2018 11:15 AM [facility] MONITOR 22 [facility] None  03/04/2018 11:15 AM [facility] MONITOR 22 [facility] None  03/07/2018 11:15 AM [facility] MONITOR 22 [facility] None  03/09/2018 11:15 AM [facility] MONITOR 22 [facility] None  03/11/2018 11:15 AM [facility] MONITOR 22 [facility] None  03/14/2018 11:15 AM [facility] MONITOR 22 [facility] None  03/16/2018 11:15 AM [facility] MONITOR 22 [facility] None  04/01/2018  9:30 AM Patel, Donika K, DO [facility] None   BP 101/81 (BP Location: Left Arm, Patient Position: Sitting, Cuff Size: Large)   Resp 16   Wt 217 lb (98.4 kg)   SpO2 97%   BMI 31.14 kg/m  Weight yesterday- 220 lb Last visit weight- N/A   HUM- Yes ALARMS- No NOSEBLEEDS- No URINE COLOR-  Yellow STOOL COLOR- Brown  [name] [name] was seen at home today and reported feeling well. He denied SOB, headache, dizziness or orthopnea. He has been compliant with his medications and his weight has been generally stable (down 3 lb since yesterday). His home health nurse discharged him this week but he said she mentioned getting him set up with a capillary INR machine. I spoke to Omer Jack about this and she was in agreement with this idea. He has  increased torsemide to 40 mg three times per week and has not had any adverse incidents. He continues to get stronger and is walking daily. I will follow up in 3 weeks but requested that he call me with any issues in the mean time.   Jacquiline Doe, EMT 11/26/17  ACTION: Home visit completed Next visit planned for 1 week

## 2017-11-30 ENCOUNTER — Other Ambulatory Visit: Payer: Self-pay | Admitting: Pharmacist

## 2017-11-30 NOTE — Patient Outreach (Signed)
[place] [facility]) Care Management  11/30/2017  BERISH BOHMAN 09-30-1950 [id number]  67 year old male on triple-fail measure medication adherence list from [place].  Per records, the following medications are due to be refilled.      Losartan: last filled 07/28/2017 #30 DS  Atorvastatin  last filled 07/21/2017 #90 DS  Unsuccessful call to Mr. Werts today.  I left a HIPAA complaint voicemail on the mobile phone.   Plan: I will follow-up with Mr. Payes again later this week.   Ralene Bathe, PharmD, [place] [phone]

## 2017-12-02 ENCOUNTER — Other Ambulatory Visit (HOSPITAL_COMMUNITY): Payer: Self-pay | Admitting: Unknown Physician Specialty

## 2017-12-02 ENCOUNTER — Telehealth: Payer: Self-pay | Admitting: Cardiology

## 2017-12-02 DIAGNOSIS — Z7901 Long term (current) use of anticoagulants: Secondary | ICD-10-CM

## 2017-12-02 DIAGNOSIS — Z95811 Presence of heart assist device: Secondary | ICD-10-CM

## 2017-12-02 NOTE — Telephone Encounter (Signed)
Spoke with pt and reminded pt of remote transmission that is due today. Pt verbalized understanding.   

## 2017-12-03 ENCOUNTER — Ambulatory Visit (HOSPITAL_COMMUNITY)
Admission: RE | Admit: 2017-12-03 | Discharge: 2017-12-03 | Disposition: A | Discharge status: Home / Self Care | Payer: PPO | Source: Ambulatory Visit | Attending: Cardiology | Admitting: Cardiology

## 2017-12-03 ENCOUNTER — Ambulatory Visit (HOSPITAL_COMMUNITY): Payer: Self-pay | Admitting: Pharmacist

## 2017-12-03 DIAGNOSIS — Z7901 Long term (current) use of anticoagulants: Secondary | ICD-10-CM

## 2017-12-03 DIAGNOSIS — Z95811 Presence of heart assist device: Secondary | ICD-10-CM | POA: Diagnosis not present

## 2017-12-03 LAB — COMPREHENSIVE METABOLIC PANEL
ALBUMIN: 2.7 g/dL — AB (ref 3.5–5.0)
ALT: 21 U/L (ref 0–44)
AST: 26 U/L (ref 15–41)
Alkaline Phosphatase: 140 U/L — ABNORMAL HIGH (ref 38–126)
Anion gap: 9 (ref 5–15)
BUN: 26 mg/dL — AB (ref 8–23)
CHLORIDE: 106 mmol/L (ref 98–111)
CO2: 22 mmol/L (ref 22–32)
Calcium: 9.1 mg/dL (ref 8.9–10.3)
Creatinine, Ser: 1.19 mg/dL (ref 0.61–1.24)
GFR calc Af Amer: >60 mL/min (ref >60)
Glucose, Bld: 84 mg/dL (ref 70–99)
POTASSIUM: 3.8 mmol/L (ref 3.5–5.1)
SODIUM: 137 mmol/L (ref 135–145)
Total Bilirubin: 1.1 mg/dL (ref 0.3–1.2)
Total Protein: 9.2 g/dL — ABNORMAL HIGH (ref 6.5–8.1)

## 2017-12-03 LAB — LACTATE DEHYDROGENASE: LDH: 220 U/L — ABNORMAL HIGH (ref 98–192)

## 2017-12-03 LAB — CBC
HEMATOCRIT: 31.8 % — AB (ref 39.0–52.0)
Hemoglobin: 9.3 g/dL — ABNORMAL LOW (ref 13.0–17.0)
MCH: 23 pg — ABNORMAL LOW (ref 26.0–34.0)
MCHC: 29.2 g/dL — ABNORMAL LOW (ref 30.0–36.0)
MCV: 78.7 fL (ref 78.0–100.0)
Platelets: 227 10*3/uL (ref 150–400)
RBC: 4.04 MIL/uL — ABNORMAL LOW (ref 4.22–5.81)
RDW: 19.9 % — AB (ref 11.5–15.5)
WBC: 4.8 10*3/uL (ref 4.0–10.5)

## 2017-12-03 LAB — PROTIME-INR
INR: 4.31 — AB
PROTHROMBIN TIME: 41 s — AB (ref 11.4–15.2)

## 2017-12-06 ENCOUNTER — Other Ambulatory Visit (HOSPITAL_COMMUNITY): Payer: Self-pay | Admitting: *Deleted

## 2017-12-06 ENCOUNTER — Telehealth (HOSPITAL_COMMUNITY): Payer: Self-pay | Admitting: Pharmacist

## 2017-12-06 ENCOUNTER — Other Ambulatory Visit: Payer: Self-pay | Admitting: Pharmacist

## 2017-12-06 DIAGNOSIS — Z7901 Long term (current) use of anticoagulants: Secondary | ICD-10-CM

## 2017-12-06 DIAGNOSIS — Z95811 Presence of heart assist device: Secondary | ICD-10-CM

## 2017-12-06 NOTE — Patient Outreach (Signed)
[place] [facility]) Care Management  12/06/2017  [name] [name] 02/22/1951 [id number]   Reason for consult: medication adherence  Unsuccessful telephone call attempt #2 to patient.   -HIPAA compliant voicemail left requesting a return call.    Plan:  I will make another outreach attempt to patient within 3-4 business days.   Ralene Bathe, PharmD, [place] [phone]

## 2017-12-06 NOTE — Telephone Encounter (Signed)
Cardiac Rehab Medication Review by a Pharmacist  Does the patient feel that his/her medications are working for him/her?  yes  Has the patient been experiencing any side effects to the medications prescribed?  no  Does the patient measure his/her own blood pressure or blood glucose at home?  yes   Does the patient have any problems obtaining medications due to transportation or finances?   no  Understanding of regimen: good Understanding of indications: good Potential of compliance: good    Pharmacist comments: Patient is good with their medications.   Thank you for allowing pharmacy to be a part of this patient's care.  Tamela Gammon, PharmD 12/06/2017 5:37 PM PGY-1 Pharmacy Resident Direct Phone: [phone] Please check [email or web address] for unit-specific pharmacist phone numbers

## 2017-12-08 ENCOUNTER — Ambulatory Visit (HOSPITAL_COMMUNITY): Payer: Self-pay | Admitting: Pharmacist

## 2017-12-08 DIAGNOSIS — Z7901 Long term (current) use of anticoagulants: Secondary | ICD-10-CM

## 2017-12-08 LAB — POCT INR: INR: 2.3 (ref 2.0–3.0)

## 2017-12-09 ENCOUNTER — Encounter (HOSPITAL_COMMUNITY)
Admission: RE | Admit: 2017-12-09 | Discharge: 2017-12-09 | Disposition: A | Discharge status: Home / Self Care | Payer: PPO | Source: Ambulatory Visit | Attending: Internal Medicine | Admitting: Internal Medicine

## 2017-12-09 ENCOUNTER — Encounter (HOSPITAL_COMMUNITY): Payer: Self-pay

## 2017-12-09 VITALS — Ht 70.0 in | Wt 235.7 lb

## 2017-12-09 DIAGNOSIS — I5023 Acute on chronic systolic (congestive) heart failure: Secondary | ICD-10-CM | POA: Insufficient documentation

## 2017-12-09 DIAGNOSIS — I255 Ischemic cardiomyopathy: Secondary | ICD-10-CM | POA: Diagnosis not present

## 2017-12-09 DIAGNOSIS — I130 Hypertensive heart and chronic kidney disease with heart failure and stage 1 through stage 4 chronic kidney disease, or unspecified chronic kidney disease: Secondary | ICD-10-CM | POA: Diagnosis not present

## 2017-12-09 DIAGNOSIS — E11319 Type 2 diabetes mellitus with unspecified diabetic retinopathy without macular edema: Secondary | ICD-10-CM | POA: Diagnosis not present

## 2017-12-09 DIAGNOSIS — N189 Chronic kidney disease, unspecified: Secondary | ICD-10-CM | POA: Insufficient documentation

## 2017-12-09 DIAGNOSIS — E7800 Pure hypercholesterolemia, unspecified: Secondary | ICD-10-CM | POA: Diagnosis not present

## 2017-12-09 DIAGNOSIS — E1122 Type 2 diabetes mellitus with diabetic chronic kidney disease: Secondary | ICD-10-CM | POA: Insufficient documentation

## 2017-12-09 DIAGNOSIS — Z79899 Other long term (current) drug therapy: Secondary | ICD-10-CM | POA: Diagnosis not present

## 2017-12-09 DIAGNOSIS — Z794 Long term (current) use of insulin: Secondary | ICD-10-CM | POA: Diagnosis not present

## 2017-12-09 DIAGNOSIS — Z7982 Long term (current) use of aspirin: Secondary | ICD-10-CM | POA: Insufficient documentation

## 2017-12-09 DIAGNOSIS — Z95811 Presence of heart assist device: Secondary | ICD-10-CM | POA: Diagnosis not present

## 2017-12-09 DIAGNOSIS — Z7901 Long term (current) use of anticoagulants: Secondary | ICD-10-CM | POA: Diagnosis not present

## 2017-12-09 DIAGNOSIS — Z9889 Other specified postprocedural states: Secondary | ICD-10-CM | POA: Insufficient documentation

## 2017-12-09 DIAGNOSIS — Z87891 Personal history of nicotine dependence: Secondary | ICD-10-CM | POA: Insufficient documentation

## 2017-12-09 DIAGNOSIS — I5022 Chronic systolic (congestive) heart failure: Secondary | ICD-10-CM

## 2017-12-09 LAB — GLUCOSE, CAPILLARY: Glucose-Capillary: 172 mg/dL — ABNORMAL HIGH (ref 70–99)

## 2017-12-09 NOTE — Progress Notes (Signed)
Cardiac Individual Treatment Plan  Patient Details  Name: [name] [name] MRN: [id number] Date of Birth: [birth year] Referring Provider:     CARDIAC REHAB PHASE II ORIENTATION from 12/09/2017 in [facility]  Referring Provider  Glori Bickers MD      Initial Encounter Date:    CARDIAC REHAB PHASE II ORIENTATION from 12/09/2017 in [facility]  Date  12/09/17      Visit Diagnosis: Heart failure, chronic systolic ([place])  Left ventricular assist device present ([place])  Patient's Home Medications on Admission:  Current Outpatient Medications:  .  ascorbic acid (VITAMIN C) 500 MG tablet, Take 1 tablet (500 mg total) by mouth 2 (two) times daily., Disp: 100 tablet, Rfl: 0 .  aspirin 81 MG chewable tablet, Chew 1 tablet (81 mg total) by mouth daily., Disp: , Rfl:  .  gabapentin (NEURONTIN) 100 MG capsule, Take 1 capsule (100 mg total) by mouth at bedtime., Disp: 90 capsule, Rfl: 10 .  gabapentin (NEURONTIN) 300 MG capsule, Take 1 capsule (300 mg total) by mouth 2 (two) times daily., Disp: 180 capsule, Rfl: 3 .  Insulin Pen Needle (COMFORT EZ PEN NEEDLES) 32G X 6 MM MISC, 1 application by Does not apply route at bedtime., Disp: 100 each, Rfl: 0 .  magnesium oxide (MAG-OX) 400 (241.3 Mg) MG tablet, Take 1 tablet (400 mg total) by mouth 2 (two) times daily., Disp: 60 tablet, Rfl: 0 .  Multiple Vitamin (MULTIVITAMIN WITH MINERALS) TABS tablet, Take 1 tablet by mouth daily., Disp: , Rfl:  .  potassium chloride (K-DUR) 10 MEQ tablet, Take 1 tablet (10 mEq total) by mouth every Monday, Wednesday, and Friday., Disp: 90 tablet, Rfl: 3 .  protein supplement shake (PREMIER PROTEIN) LIQD, Take 325 mLs (11 oz total) by mouth 2 (two) times daily between meals., Disp: , Rfl: 0 .  spironolactone (ALDACTONE) 25 MG tablet, Take 1 tablet (25 mg total) by mouth daily., Disp: 30 tablet, Rfl: 11 .  torsemide (DEMADEX) 20 MG tablet, Take 2 tablets (40  mg total) by mouth every Monday, Wednesday, and Friday., Disp: 90 tablet, Rfl: 0 .  warfarin (COUMADIN) 2.5 MG tablet, Take 1 tablet (2.5 mg) daily except 2 tablets (5 mg) on Wed, Disp: , Rfl:   Past Medical History: Past Medical History:  Diagnosis Date  . AICD (automatic cardioverter/defibrillator) present 02/05/2014   Upgrade to [employer]onic biventricular ICD, serial number  [id number]   . Atrial flutter ([place]) 04/2012   s/p TEE-EPS+RFCA 04/2012  . CAD (coronary artery disease) 2536,6440 X 2    RCA-T, 70% PL (off CFX), 99% Prox LAD/90% Dist LAD, S/P TAXUS stent x 2  . CHF (congestive heart failure) ([place])   . Chronic anticoagulation   . Chronic systolic heart failure ([place])   . CKD (chronic kidney disease)   . Diabetic retinopathy ([place])   . DM type 2 (diabetes mellitus, type 2) ([facility])    insulin dependent  . HTN (hypertension)   . Hypercholesteremia    ablation  . ICD (implantable cardiac defibrillator) in place   . Ischemic cardiomyopathy March 2015   20-25% 2D   . Nephrolithiasis   . Ventricular tachycardia ([facility])     Tobacco Use: Social History   Tobacco Use  Smoking Status Former Smoker  . Packs/day: 1.00  . Years: 29.00  . Pack years: 29.00  . Types: Cigarettes  . Last attempt to quit: 03/02/2000  . Years since quitting: 17.7  Smokeless  Tobacco Never Used    Labs: Recent Review Flowsheet Data    Labs for ITP Cardiac and Pulmonary Rehab Latest Ref Rng & Units 09/06/2017 09/07/2017 09/08/2017 09/09/2017 11/11/2017   Cholestrol <200 mg/dL - - - - -   LDLCALC <100 mg/dL - - - - -   LDLDIRECT mg/dL - - - - -   HDL >40 mg/dL - - - - -   Trlycerides <150 mg/dL - - - - -   Hemoglobin A1c 4.0 - 5.6 % - - - - 6.3(A)   PHART 7.350 - 7.450 - - - - -   PCO2ART 32.0 - 48.0 mmHg - - - - -   HCO3 20.0 - 28.0 mmol/L - - - - -   TCO2 22 - 32 mmol/L - - - - -   ACIDBASEDEF 0.0 - 2.0 mmol/L - - - - -   O2SAT % 61.5 54.4 60.9 52.1 -      Capillary Blood Glucose: Lab Results   Component Value Date   GLUCAP 172 (H) 12/09/2017   GLUCAP 94 09/25/2017   GLUCAP 107 (H) 09/24/2017   GLUCAP 141 (H) 09/24/2017   GLUCAP 114 (H) 09/24/2017     Exercise Target Goals: Exercise Program Goal: Individual exercise prescription set using results from initial 6 min walk test and THRR while considering  patient's activity barriers and safety.   Exercise Prescription Goal: Initial exercise prescription builds to 30-45 minutes a day of aerobic activity, 2-3 days per week.  Home exercise guidelines will be given to patient during program as part of exercise prescription that the participant will acknowledge.  Activity Barriers & Risk Stratification: Activity Barriers & Cardiac Risk Stratification - 12/09/17 1131      Activity Barriers & Cardiac Risk Stratification   Activity Barriers  Deconditioning;Muscular Weakness    Cardiac Risk Stratification  High       6 Minute Walk: 6 Minute Walk    Row Name 12/09/17 1007 12/09/17 1127       6 Minute Walk   Phase  Initial  Initial    Distance  -  685 feet    Walk Time  -  4.15 minutes    # of Rest Breaks  -  1    MPH  -  1.29    METS  -  1.34    RPE  -  12    Perceived Dyspnea   -  0    VO2 Peak  -  4.7    Symptoms  -  Yes (comment)    Comments  -  Leg Fatigue     Resting HR  -  89 bpm    Resting BP  -  - MAP: 84    Resting Oxygen Saturation   -  96 %    Max Ex. HR  -  103 bpm    Max Ex. BP  -  - MAP: 88    2 Minute Post BP  -  - MAP: 78       Oxygen Initial Assessment:   Oxygen Re-Evaluation:   Oxygen Discharge (Final Oxygen Re-Evaluation):   Initial Exercise Prescription: Initial Exercise Prescription - 12/09/17 1100      Date of Initial Exercise RX and Referring Provider   Date  12/09/17    Referring Provider  Glori Bickers MD    Expected Discharge Date  03/18/18      NuStep   Level  1    SPM  75    Minutes  10    METs  1.5      Arm Ergometer   Level  1    Watts  10    Minutes  10     METs  1.5      Track   Laps  5    Minutes  10    METs  1.84      Prescription Details   Frequency (times per week)  3    Duration  Progress to 30 minutes of continuous aerobic without signs/symptoms of physical distress      Intensity   THRR 40-80% of Max Heartrate  61-122    Ratings of Perceived Exertion  11-13    Perceived Dyspnea  0-4      Progression   Progression  Continue progressive overload as per policy without signs/symptoms or physical distress.      Resistance Training   Training Prescription  Yes    Weight  2    Reps  10-15       Perform Capillary Blood Glucose checks as needed.  Exercise Prescription Changes:   Exercise Comments:   Exercise Goals and Review: Exercise Goals    Row Name 12/09/17 1131             Exercise Goals   Increase Physical Activity  Yes       Intervention  Provide advice, education, support and counseling about physical activity/exercise needs.;Develop an individualized exercise prescription for aerobic and resistive training based on initial evaluation findings, risk stratification, comorbidities and participant's personal goals.       Expected Outcomes  Short Term: Attend rehab on a regular basis to increase amount of physical activity.;Long Term: Add in home exercise to make exercise part of routine and to increase amount of physical activity.;Long Term: Exercising regularly at least 3-5 days a week.       Increase Strength and Stamina  Yes       Intervention  Provide advice, education, support and counseling about physical activity/exercise needs.;Develop an individualized exercise prescription for aerobic and resistive training based on initial evaluation findings, risk stratification, comorbidities and participant's personal goals.       Expected Outcomes  Short Term: Increase workloads from initial exercise prescription for resistance, speed, and METs.;Short Term: Perform resistance training exercises routinely during rehab  and add in resistance training at home;Long Term: Improve cardiorespiratory fitness, muscular endurance and strength as measured by increased METs and functional capacity (6MWT)       Able to understand and use rate of perceived exertion (RPE) scale  Yes       Intervention  Provide education and explanation on how to use RPE scale       Expected Outcomes  Short Term: Able to use RPE daily in rehab to express subjective intensity level;Long Term:  Able to use RPE to guide intensity level when exercising independently       Knowledge and understanding of Target Heart Rate Range (THRR)  Yes       Intervention  Provide education and explanation of THRR including how the numbers were predicted and where they are located for reference       Expected Outcomes  Short Term: Able to state/look up THRR;Short Term: Able to use daily as guideline for intensity in rehab;Long Term: Able to use THRR to govern intensity when exercising independently       Able to check pulse independently  Yes  Intervention  Provide education and demonstration on how to check pulse in carotid and radial arteries.;Review the importance of being able to check your own pulse for safety during independent exercise       Expected Outcomes  Short Term: Able to explain why pulse checking is important during independent exercise;Long Term: Able to check pulse independently and accurately       Understanding of Exercise Prescription  Yes       Intervention  Provide education, explanation, and written materials on patient's individual exercise prescription       Expected Outcomes  Short Term: Able to explain program exercise prescription;Long Term: Able to explain home exercise prescription to exercise independently          Exercise Goals Re-Evaluation :   Discharge Exercise Prescription (Final Exercise Prescription Changes):   Nutrition:  Target Goals: Understanding of nutrition guidelines, daily intake of sodium 1500mg ,  cholesterol 200mg , calories 30% from fat and 7% or less from saturated fats, daily to have 5 or more servings of fruits and vegetables.  Biometrics: Pre Biometrics - 12/09/17 1130      Pre Biometrics   Height  5\' 10"  (1.778 m)    Weight  106.9 kg    Waist Circumference  44 inches    Hip Circumference  44.5 inches    Waist to Hip Ratio  0.99 %    BMI (Calculated)  33.82    Triceps Skinfold  24 mm    % Body Fat  33.8 %    Grip Strength  33 kg    Flexibility  11 in    Single Leg Stand  4.77 seconds        Nutrition Therapy Plan and Nutrition Goals:   Nutrition Assessments:   Nutrition Goals Re-Evaluation:   Nutrition Goals Re-Evaluation:   Nutrition Goals Discharge (Final Nutrition Goals Re-Evaluation):   Psychosocial: Target Goals: Acknowledge presence or absence of significant depression and/or stress, maximize coping skills, provide positive support system. Participant is able to verbalize types and ability to use techniques and skills needed for reducing stress and depression.  Initial Review & Psychosocial Screening: Initial Psych Review & Screening - 12/09/17 1213      Initial Review   Current issues with  Current Stress Concerns    Source of Stress Concerns  Chronic Illness      Family Dynamics   Good Support System?  Yes   Pt lists his wife [name] as a source of support. She is present with Josph Macho at the orientation appointment.      Barriers   Psychosocial barriers to participate in program  There are no identifiable barriers or psychosocial needs.      Screening Interventions   Interventions  Encouraged to exercise    Expected Outcomes  Short Term goal: Identification and review with participant of any Quality of Life or Depression concerns found by scoring the questionnaire.;Long Term goal: The participant improves quality of Life and PHQ9 Scores as seen by post scores and/or verbalization of changes       Quality of Life Scores: Quality of Life -  12/09/17 1141      Quality of Life   Select  Quality of Life      Quality of Life Scores   Health/Function Pre  17.71 %    Socioeconomic Pre  20.1 %    Psych/Spiritual Pre  22 %    Family Pre  22.8 %    GLOBAL Pre  19.89 %  Scores of 19 and below usually indicate a poorer quality of life in these areas.  A difference of  2-3 points is a clinically meaningful difference.  A difference of 2-3 points in the total score of the Quality of Life Index has been associated with significant improvement in overall quality of life, self-image, physical symptoms, and general health in studies assessing change in quality of life.  PHQ-9: Recent Review Flowsheet Data    Depression screen [facility] 2/9 10/08/2017 09/29/2017   Decreased Interest 0 0   Down, Depressed, Hopeless 0 0   PHQ - 2 Score 0 0     Interpretation of Total Score  Total Score Depression Severity:  1-4 = Minimal depression, 5-9 = Mild depression, 10-14 = Moderate depression, 15-19 = Moderately severe depression, 20-27 = Severe depression   Psychosocial Evaluation and Intervention:   Psychosocial Re-Evaluation:   Psychosocial Discharge (Final Psychosocial Re-Evaluation):   Vocational Rehabilitation: Provide vocational rehab assistance to qualifying candidates.   Vocational Rehab Evaluation & Intervention: Vocational Rehab - 12/09/17 1211      Initial Vocational Rehab Evaluation & Intervention   Assessment shows need for Vocational Rehabilitation  No       Education: Education Goals: Education classes will be provided on a weekly basis, covering required topics. Participant will state understanding/return demonstration of topics presented.  Learning Barriers/Preferences: Learning Barriers/Preferences - 12/09/17 1142      Learning Barriers/Preferences   Learning Barriers  Sight    Learning Preferences  Written Material;Individual Instruction       Education Topics: Count Your Pulse:  -Group instruction provided  by verbal instruction, demonstration, patient participation and written materials to support subject.  Instructors address importance of being able to find your pulse and how to count your pulse when at home without a heart monitor.  Patients get hands on experience counting their pulse with staff help and individually.   Heart Attack, Angina, and Risk Factor Modification:  -Group instruction provided by verbal instruction, video, and written materials to support subject.  Instructors address signs and symptoms of angina and heart attacks.    Also discuss risk factors for heart disease and how to make changes to improve heart health risk factors.   Functional Fitness:  -Group instruction provided by verbal instruction, demonstration, patient participation, and written materials to support subject.  Instructors address safety measures for doing things around the house.  Discuss how to get up and down off the floor, how to pick things up properly, how to safely get out of a chair without assistance, and balance training.   Meditation and Mindfulness:  -Group instruction provided by verbal instruction, patient participation, and written materials to support subject.  Instructor addresses importance of mindfulness and meditation practice to help reduce stress and improve awareness.  Instructor also leads participants through a meditation exercise.    Stretching for Flexibility and Mobility:  -Group instruction provided by verbal instruction, patient participation, and written materials to support subject.  Instructors lead participants through series of stretches that are designed to increase flexibility thus improving mobility.  These stretches are additional exercise for major muscle groups that are typically performed during regular warm up and cool down.   Hands Only CPR:  -Group verbal, video, and participation provides a basic overview of AHA guidelines for community CPR. Role-play of  emergencies allow participants the opportunity to practice calling for help and chest compression technique with discussion of AED use.   Hypertension: -Group verbal and written instruction that provides  a basic overview of hypertension including the most recent diagnostic guidelines, risk factor reduction with self-care instructions and medication management.    Nutrition I class: Heart Healthy Eating:  -Group instruction provided by PowerPoint slides, verbal discussion, and written materials to support subject matter. The instructor gives an explanation and review of the Therapeutic Lifestyle Changes diet recommendations, which includes a discussion on lipid goals, dietary fat, sodium, fiber, plant stanol/sterol esters, sugar, and the components of a well-balanced, healthy diet.   Nutrition II class: Lifestyle Skills:  -Group instruction provided by PowerPoint slides, verbal discussion, and written materials to support subject matter. The instructor gives an explanation and review of label reading, grocery shopping for heart health, heart healthy recipe modifications, and ways to make healthier choices when eating out.   Diabetes Question & Answer:  -Group instruction provided by PowerPoint slides, verbal discussion, and written materials to support subject matter. The instructor gives an explanation and review of diabetes co-morbidities, pre- and post-prandial blood glucose goals, pre-exercise blood glucose goals, signs, symptoms, and treatment of hypoglycemia and hyperglycemia, and foot care basics.   Diabetes Blitz:  -Group instruction provided by PowerPoint slides, verbal discussion, and written materials to support subject matter. The instructor gives an explanation and review of the physiology behind type 1 and type 2 diabetes, diabetes medications and rational behind using different medications, pre- and post-prandial blood glucose recommendations and Hemoglobin A1c goals, diabetes  diet, and exercise including blood glucose guidelines for exercising safely.    Portion Distortion:  -Group instruction provided by PowerPoint slides, verbal discussion, written materials, and food models to support subject matter. The instructor gives an explanation of serving size versus portion size, changes in portions sizes over the last 20 years, and what consists of a serving from each food group.   Stress Management:  -Group instruction provided by verbal instruction, video, and written materials to support subject matter.  Instructors review role of stress in heart disease and how to cope with stress positively.     Exercising on Your Own:  -Group instruction provided by verbal instruction, power point, and written materials to support subject.  Instructors discuss benefits of exercise, components of exercise, frequency and intensity of exercise, and end points for exercise.  Also discuss use of nitroglycerin and activating EMS.  Review options of places to exercise outside of rehab.  Review guidelines for sex with heart disease.   Cardiac Drugs I:  -Group instruction provided by verbal instruction and written materials to support subject.  Instructor reviews cardiac drug classes: antiplatelets, anticoagulants, beta blockers, and statins.  Instructor discusses reasons, side effects, and lifestyle considerations for each drug class.   Cardiac Drugs II:  -Group instruction provided by verbal instruction and written materials to support subject.  Instructor reviews cardiac drug classes: angiotensin converting enzyme inhibitors (ACE-I), angiotensin II receptor blockers (ARBs), nitrates, and calcium channel blockers.  Instructor discusses reasons, side effects, and lifestyle considerations for each drug class.   Anatomy and Physiology of the Circulatory System:  Group verbal and written instruction and models provide basic cardiac anatomy and physiology, with the coronary electrical and  arterial systems. Review of: AMI, Angina, Valve disease, Heart Failure, Peripheral Artery Disease, Cardiac Arrhythmia, Pacemakers, and the ICD.   Other Education:  -Group or individual verbal, written, or video instructions that support the educational goals of the cardiac rehab program.   Holiday Eating Survival Tips:  -Group instruction provided by PowerPoint slides, verbal discussion, and written materials to support subject matter. The  instructor gives patients tips, tricks, and techniques to help them not only survive but enjoy the holidays despite the onslaught of food that accompanies the holidays.   Knowledge Questionnaire Score: Knowledge Questionnaire Score - 12/09/17 1143      Knowledge Questionnaire Score   Pre Score  19/24       Core Components/Risk Factors/Patient Goals at Admission: Personal Goals and Risk Factors at Admission - 12/09/17 1144      Core Components/Risk Factors/Patient Goals on Admission    Weight Management  Yes;Weight Loss    Intervention  Weight Management: Develop a combined nutrition and exercise program designed to reach desired caloric intake, while maintaining appropriate intake of nutrient and fiber, sodium and fats, and appropriate energy expenditure required for the weight goal.;Weight Management: Provide education and appropriate resources to help participant work on and attain dietary goals.;Weight Management/Obesity: Establish reasonable short term and long term weight goals.;Obesity: Provide education and appropriate resources to help participant work on and attain dietary goals.    Admit Weight  235 lb 10.8 oz (106.9 kg)    Goal Weight: Long Term  225 lb (102.1 kg)    Expected Outcomes  Short Term: Continue to assess and modify interventions until short term weight is achieved;Long Term: Adherence to nutrition and physical activity/exercise program aimed toward attainment of established weight goal;Weight Loss: Understanding of general  recommendations for a balanced deficit meal plan, which promotes 1-2 lb weight loss per week and includes a negative energy balance of [phone] kcal/d;Understanding recommendations for meals to include 15-35% energy as protein, 25-35% energy from fat, 35-60% energy from carbohydrates, less than 200mg  of dietary cholesterol, 20-35 gm of total fiber daily;Understanding of distribution of calorie intake throughout the day with the consumption of 4-5 meals/snacks    Diabetes  Yes    Intervention  Provide education about signs/symptoms and action to take for hypo/hyperglycemia.;Provide education about proper nutrition, including hydration, and aerobic/resistive exercise prescription along with prescribed medications to achieve blood glucose in normal ranges: Fasting glucose 65-99 mg/dL    Expected Outcomes  Short Term: Participant verbalizes understanding of the signs/symptoms and immediate care of hyper/hypoglycemia, proper foot care and importance of medication, aerobic/resistive exercise and nutrition plan for blood glucose control.;Long Term: Attainment of HbA1C < 7%.    Heart Failure  Yes    Intervention  Provide a combined exercise and nutrition program that is supplemented with education, support and counseling about heart failure. Directed toward relieving symptoms such as shortness of breath, decreased exercise tolerance, and extremity edema.    Expected Outcomes  Improve functional capacity of life;Short term: Attendance in program 2-3 days a week with increased exercise capacity. Reported lower sodium intake. Reported increased fruit and vegetable intake. Reports medication compliance.;Short term: Daily weights obtained and reported for increase. Utilizing diuretic protocols set by physician.;Long term: Adoption of self-care skills and reduction of barriers for early signs and symptoms recognition and intervention leading to self-care maintenance.    Hypertension  Yes    Intervention  Provide education  on lifestyle modifcations including regular physical activity/exercise, weight management, moderate sodium restriction and increased consumption of fresh fruit, vegetables, and low fat dairy, alcohol moderation, and smoking cessation.;Monitor prescription use compliance.    Expected Outcomes  Short Term: Continued assessment and intervention until BP is < 140/54mm HG in hypertensive participants. < 130/38mm HG in hypertensive participants with diabetes, heart failure or chronic kidney disease.;Long Term: Maintenance of blood pressure at goal levels.    Lipids  Yes  Intervention  Provide education and support for participant on nutrition & aerobic/resistive exercise along with prescribed medications to achieve LDL 70mg , HDL >40mg .    Expected Outcomes  Short Term: Participant states understanding of desired cholesterol values and is compliant with medications prescribed. Participant is following exercise prescription and nutrition guidelines.;Long Term: Cholesterol controlled with medications as prescribed, with individualized exercise RX and with personalized nutrition plan. Value goals: LDL < 70mg , HDL > 40 mg.       Core Components/Risk Factors/Patient Goals Review:    Core Components/Risk Factors/Patient Goals at Discharge (Final Review):    ITP Comments: ITP Comments    Row Name 12/09/17 1126           ITP Comments  Dr. Fransico Him, Medical Director           Comments: Patient attended orientation from [phone] to 1012 to review rules and guidelines for program. Completed 6 minute walk test, Intitial ITP, and exercise prescription.  VSS. Telemetry-Atrial Fib as noted in prior EKG.  Asymptomatic.

## 2017-12-10 ENCOUNTER — Other Ambulatory Visit (HOSPITAL_COMMUNITY): Payer: Self-pay | Admitting: *Deleted

## 2017-12-10 ENCOUNTER — Telehealth (HOSPITAL_COMMUNITY): Payer: Self-pay | Admitting: *Deleted

## 2017-12-10 ENCOUNTER — Encounter (HOSPITAL_COMMUNITY): Payer: Self-pay

## 2017-12-10 ENCOUNTER — Ambulatory Visit (HOSPITAL_COMMUNITY)
Admission: RE | Admit: 2017-12-10 | Discharge: 2017-12-10 | Disposition: A | Discharge status: Home / Self Care | Payer: PPO | Source: Ambulatory Visit | Attending: Cardiology | Admitting: Cardiology

## 2017-12-10 VITALS — BP 109/88 | HR 92 | Wt 234.6 lb

## 2017-12-10 DIAGNOSIS — I4892 Unspecified atrial flutter: Secondary | ICD-10-CM | POA: Diagnosis not present

## 2017-12-10 DIAGNOSIS — I255 Ischemic cardiomyopathy: Secondary | ICD-10-CM | POA: Insufficient documentation

## 2017-12-10 DIAGNOSIS — Z79899 Other long term (current) drug therapy: Secondary | ICD-10-CM | POA: Diagnosis not present

## 2017-12-10 DIAGNOSIS — I10 Essential (primary) hypertension: Secondary | ICD-10-CM | POA: Diagnosis not present

## 2017-12-10 DIAGNOSIS — Z95811 Presence of heart assist device: Secondary | ICD-10-CM | POA: Diagnosis not present

## 2017-12-10 DIAGNOSIS — I472 Ventricular tachycardia, unspecified: Secondary | ICD-10-CM

## 2017-12-10 DIAGNOSIS — N183 Chronic kidney disease, stage 3 unspecified: Secondary | ICD-10-CM

## 2017-12-10 DIAGNOSIS — I5022 Chronic systolic (congestive) heart failure: Secondary | ICD-10-CM

## 2017-12-10 DIAGNOSIS — E119 Type 2 diabetes mellitus without complications: Secondary | ICD-10-CM | POA: Diagnosis not present

## 2017-12-10 DIAGNOSIS — I2510 Atherosclerotic heart disease of native coronary artery without angina pectoris: Secondary | ICD-10-CM

## 2017-12-10 DIAGNOSIS — E7800 Pure hypercholesterolemia, unspecified: Secondary | ICD-10-CM | POA: Insufficient documentation

## 2017-12-10 DIAGNOSIS — Z794 Long term (current) use of insulin: Secondary | ICD-10-CM | POA: Diagnosis not present

## 2017-12-10 DIAGNOSIS — Z7901 Long term (current) use of anticoagulants: Secondary | ICD-10-CM

## 2017-12-10 DIAGNOSIS — Z955 Presence of coronary angioplasty implant and graft: Secondary | ICD-10-CM | POA: Insufficient documentation

## 2017-12-10 DIAGNOSIS — I130 Hypertensive heart and chronic kidney disease with heart failure and stage 1 through stage 4 chronic kidney disease, or unspecified chronic kidney disease: Secondary | ICD-10-CM | POA: Insufficient documentation

## 2017-12-10 DIAGNOSIS — Z7982 Long term (current) use of aspirin: Secondary | ICD-10-CM | POA: Insufficient documentation

## 2017-12-10 DIAGNOSIS — L8990 Pressure ulcer of unspecified site, unspecified stage: Secondary | ICD-10-CM

## 2017-12-10 DIAGNOSIS — I5082 Biventricular heart failure: Secondary | ICD-10-CM | POA: Diagnosis not present

## 2017-12-10 DIAGNOSIS — I4891 Unspecified atrial fibrillation: Secondary | ICD-10-CM | POA: Insufficient documentation

## 2017-12-10 DIAGNOSIS — Z95810 Presence of automatic (implantable) cardiac defibrillator: Secondary | ICD-10-CM | POA: Insufficient documentation

## 2017-12-10 DIAGNOSIS — D649 Anemia, unspecified: Secondary | ICD-10-CM | POA: Diagnosis not present

## 2017-12-10 DIAGNOSIS — E1122 Type 2 diabetes mellitus with diabetic chronic kidney disease: Secondary | ICD-10-CM | POA: Insufficient documentation

## 2017-12-10 DIAGNOSIS — I4729 Other ventricular tachycardia: Secondary | ICD-10-CM

## 2017-12-10 LAB — BASIC METABOLIC PANEL
ANION GAP: 8 (ref 5–15)
BUN: 35 mg/dL — AB (ref 8–23)
CHLORIDE: 105 mmol/L (ref 98–111)
CO2: 22 mmol/L (ref 22–32)
Calcium: 9 mg/dL (ref 8.9–10.3)
Creatinine, Ser: 1.14 mg/dL (ref 0.61–1.24)
Glucose, Bld: 210 mg/dL — ABNORMAL HIGH (ref 70–99)
POTASSIUM: 3.7 mmol/L (ref 3.5–5.1)
SODIUM: 135 mmol/L (ref 135–145)

## 2017-12-10 LAB — MAGNESIUM: Magnesium: 2 mg/dL (ref 1.7–2.4)

## 2017-12-10 LAB — CBC
HCT: 29.6 % — ABNORMAL LOW (ref 39.0–52.0)
Hemoglobin: 8.8 g/dL — ABNORMAL LOW (ref 13.0–17.0)
MCH: 23 pg — ABNORMAL LOW (ref 26.0–34.0)
MCHC: 29.7 g/dL — ABNORMAL LOW (ref 30.0–36.0)
MCV: 77.5 fL — ABNORMAL LOW (ref 80.0–100.0)
Platelets: 195 K/uL (ref 150–400)
RBC: 3.82 MIL/uL — ABNORMAL LOW (ref 4.22–5.81)
RDW: 19.9 % — ABNORMAL HIGH (ref 11.5–15.5)
WBC: 5.3 K/uL (ref 4.0–10.5)
nRBC: 0 % (ref 0.0–0.2)

## 2017-12-10 LAB — LACTATE DEHYDROGENASE: LDH: 219 U/L — ABNORMAL HIGH (ref 98–192)

## 2017-12-10 MED ORDER — POTASSIUM CHLORIDE ER 10 MEQ PO TBCR: 20.0000 meq | EXTENDED_RELEASE_TABLET | ORAL | 3 refills | Status: DC

## 2017-12-10 MED ORDER — AMIODARONE HCL 200 MG PO TABS: 200.0000 mg | ORAL_TABLET | Freq: Two times a day (BID) | ORAL | 3 refills | Status: DC

## 2017-12-10 NOTE — Addendum Note (Signed)
Encounter addended by: Mertha Baars, RN on: 12/10/2017 1:09 PM  Actions taken: Sign clinical note

## 2017-12-10 NOTE — Patient Instructions (Addendum)
1. Start Amiodarone 200mg  twice a day.  2. Increase Potassium Chloride to 20 meq (2 tablets) on Monday, Wednesday, Friday.  3. Take Potassium 40 meq (4 tablets) today. 4. Continue every other day buttock dressing changes.  5. Continue Monday/Thusday dressing changes on drive line.  6. Return to [facility] in 3 weeks. 7. Need follow up appointment with Dr. [name].

## 2017-12-10 NOTE — Progress Notes (Signed)
VAD Clinic Note   HF: Dr. [name]   HPI: [name] [name] is a 67 y.o. male with h/o chronic systolic CHF due to ICM, s/p LVAD HM3 08/16/2017, [name], [name], [name], [name], [name], HTN, HL, and CKD III.  Admitted 08/11/17 with recurrent A/C systolic CHF. Milrinone added, but mixed venous saturation remained low. Seen by CT surgery/VAD team and deemed appropriate for HMIII under DT criteria. He underwent HMIII on 6/17. Chest was unable to be closed due to high intrathoracic pressure and RV failure.  He returned to the OR on 6/18 for evacuation of hematoma. Chest was later closed on 6/20. Post operative course complicated by  open chest, RV failure, recurrent VT, respiratory failure, and dysphagia. CCM consulted for vent management with extubation completed on 08/26/17.  As he improved all drips gradually weaned off. Prior to d/c  ramp echo was completed with speed optimized.  Spent 7/11 - 7/27 in CIR. Underwent hydrotherapy for his buttock wound(s). 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    Follow up for Heart Failure/LVAD:  He presents today as add on due to ICD shock. Was at home yesterday lying down when he had an ICD shock. He denies SOB, orthopnea, or PND. Taking torsemide M/W/F, and occasionally taking extra on Wednesdays. Denies fevers or chills. Buttock wound scabbed over and much improved per Wife. Has had two episodes of epistaxis, but in the setting of supratherapeutic INR. Occasional scant blood on toilet paper. No VAD alarms. He is eating 4+ large cups of ice daily, but not drinking much else. Denies orthopnea or PND. No lightheadedness or dizziness.   ICD interrogated: Had episode of VT yesterday into 180s. ATP attempted x 4, and then shocked with return to NSR (Has been in Afib for 5 months). Optivol index elevated.   VAD  Indication: Destination Therapy- Implanted 08/16/17   LVAD assessment: HM III:  Primary Controller  VAD Speed: 5300 rpms Flow: 3.8 Power: 3.8 PI: 4.4 Alarms: None Events: Occasional PI events  Fixed speed: 5300 Low speed limit: 5000  Primary: Back up battery expiring in 62months. Secondary: Back up battery expiring in 29 months   RHC 08/13/17 On milrinone 0.375 mcg/kg/min RA = 12 RV = 48/13 PA = 50/26 (35) PCW = 20 Fick cardiac output/index = 5.1/2.5 PVR = 3.0 Ao sat = 93% PA sat = 56%, 57% PAPI = 2.0 RA/PCWP = 0.60  R/LHC 01/2017  Prox RCA to Mid RCA lesion is 100% stenosed.  Prox RCA lesion is 100% stenosed.  Ost Cx to Prox Cx lesion is 40% stenosed.  Dist Cx lesion is 70% stenosed.  Ost 1st Mrg to 1st Mrg lesion is 40% stenosed.  1st Mrg lesion is 70% stenosed.  Prox LAD lesion is 50% stenosed.  Prox LAD to Mid LAD lesion is 60% stenosed.   Findings: Ao = 99/56 (73) LV = 101/5 RA = 1 RV = 29/1 PA = 23/11 (16) PCW = 6 Fick cardiac output/index = 3.8/1.7 Thermo CO/CI = 4.1/1.9 PVR = 2.6 Ao sat = 99% PA sat = 60%, 61%  Assessment: -3v CAD with 1) LAD diffuse 60% stenosis 2) LCX multiple 40% lesions 3) RCA chronically occluded proximally with L to R colllterals  Past Medical History:  Diagnosis Date  . AICD (automatic cardioverter/defibrillator) present 02/05/2014   Upgrade to Medtronic biventricular ICD, serial number  [id number]   . Atrial flutter ([place]) 04/2012   s/p TEE-EPS+RFCA 04/2012  . CAD (coronary artery disease) 6606,3016 X 2    RCA-T, 70% PL (off CFX), 99% Prox LAD/90% Dist LAD, S/P TAXUS stent x 2  . CHF (congestive heart failure) ([place])   . Chronic anticoagulation   . Chronic systolic heart failure ([place])   . CKD (chronic kidney disease)   . Diabetic retinopathy ([place])   . DM type 2 (diabetes mellitus, type 2) ([facility])    insulin dependent  . HTN (hypertension)   . Hypercholesteremia    ablation  . ICD (implantable  cardiac defibrillator) in place   . Ischemic cardiomyopathy March 2015   20-25% 2D   . Nephrolithiasis   . Ventricular tachycardia ([place])     Current Outpatient Medications  Medication Sig Dispense Refill  . ascorbic acid (VITAMIN C) 500 MG tablet Take 1 tablet (500 mg total) by mouth 2 (two) times daily. 100 tablet 0  . aspirin 81 MG chewable tablet Chew 1 tablet (81 mg total) by mouth daily.    Marland Kitchen gabapentin (NEURONTIN) 100 MG capsule Take 1 capsule (100 mg total) by mouth at bedtime. 90 capsule 10  . gabapentin (NEURONTIN) 300 MG capsule Take 1 capsule (300 mg total) by mouth 2 (two) times daily. 180 capsule 3  . Insulin Pen Needle (COMFORT EZ PEN NEEDLES) 32G X 6 MM MISC 1 application by Does not apply route at bedtime. 100 each 0  . magnesium oxide (MAG-OX) 400 (241.3 Mg) MG tablet Take 1 tablet (400 mg total) by mouth 2 (two) times daily. 60 tablet 0  . Multiple Vitamin (MULTIVITAMIN WITH MINERALS) TABS tablet Take 1 tablet by mouth daily.    . potassium chloride (K-DUR) 10 MEQ tablet Take 1 tablet (10 mEq total) by mouth every Monday, Wednesday, and Friday. 90 tablet 3  . protein supplement shake (PREMIER PROTEIN) LIQD Take 325 mLs (11 oz total) by mouth 2 (two) times daily between meals.  0  . spironolactone (ALDACTONE) 25 MG tablet Take 1 tablet (25 mg total) by mouth daily. 30 tablet 11  . torsemide (DEMADEX) 20 MG tablet Take 2 tablets (40 mg total) by mouth every Monday, Wednesday, and Friday. 90 tablet 0  . warfarin (COUMADIN) 2.5 MG tablet Take 1 tablet (2.5 mg) daily except 2 tablets (5 mg) on Wed     No current facility-administered medications for this encounter.     Penicillins  Review of systems complete and found to be negative unless listed in HPI.    There were no vitals filed for this visit.  Wt Readings from Last 3 Encounters:  12/09/17 106.9 kg (235 lb 10.8 oz)  11/26/17 98.4 kg (217 lb)  11/24/17 104.6 kg (230 lb 8 oz)    Vital Signs:  Doppler  Pressure:90 Automatc BP: 109/88 (95) HR: 92 SPO2: 98  Weight: 234 lbs w/ eqt Last weight: 233 lbs w/ equip  Physical Exam: General: Well appearing this am. NAD.  HEENT: Normal. Neck: Supple, JVP 8-9 cm + cm. Carotids OK.  Cardiac:  Mechanical heart sounds with LVAD hum present.  Lungs:  CTAB, normal effort.  Abdomen:  NT, ND, no HSM. No bruits or masses. +BS  LVAD exit site: Dressing dry and intact. No erythema or drainage. Stabilization device present and accurately applied. Driveline dressing changed daily per sterile technique. Extremities:  Warm and dry. No cyanosis, clubbing, or rash. Trace to 1+ edema.  Neuro:  Alert & oriented x  3. Cranial nerves grossly intact. Moves all 4 extremities w/o difficulty. Affect pleasant     ASSESSMENT AND PLAN:  1. Chronic systolic CHF with biventricular failure-> cardiogenic shock: - Echo 08/13/2017 EF 20-25%.s/p Medtronic BiV ICD in place. Cath 12/18 with stable 1v CAD. s/p HM-3 implant 08/16/17.  - NYHA II symptoms - Volume status elevated on exam.  Reinforced fluid restriction to < 2 L daily, sodium restriction to less than 2000 mg daily, and the importance of daily weights.  Had been eating 4-5 very large cups of ice.  - Continue torsemide to 40 mg M/W/F as ordered.  - Continue spiro 25 mg daily.  - Off digoxin. 2. 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 - On aspirin and coumadin. Denies bleeding.  - INR goal 2-2.5 INR 2.3. Dosing reviewed with Pharm-D and VAD coordinator.  - Ramp ECHO completed 09/09/2017 with speed increased to 5300.  - VAD interrogated personally. Parameters stable.   - LDH 219 3. CKD III: - Creatinine 1.14 4. [name]/VF:  - ICD shock 12/09/17 with VT in 180s.  - Will restart amiodarone at 200 mg BID for now and schedule EP follow up.  - Will change ICD settings to ATP zone 158 - 200, ATP + shock 200 - 240, and VF/shock for anything > 240 (Dr. [name] discussed with EP) - Recurrent VT on 6/22, VF 6/24, and VT 6/26  requiring emergent bedside cardioversion.  - Mg stable at 2.0 - K 3.7. Will give 40 meq today, and increase dosing to 20 meq M/W/F with torsemide 5. AFL/atrial fibrillation:  - S/p previous ablation. He is currently in atrial fibrillation.  -Rate controlled. Off amio. On coumadin as above.  - No change.  6. CAD s/p PCI of RCA and TRR:NHAFBX cath with stable CAD as above:  - No s/s of ischemia.    - Continue ASA and statin.  7. [name]: Recent A1c8.3 on 6/3. - Per PCP.  8. Anemia:  - Hgb 8.8. Down over the past month from 10.0, but has had several nose bleeds in setting of elevated INR.  9. Deconditioning - Stable. Encouraged CR 10. Buttock Pressure Ulcer: - Continues to improve.  11. Hoarseness - Resolved.  13. Driveline drainage - Improved.   Restart amio as above and supp K. ICD setting adjusted in clinic. RTC 3-4 weeks. Will also schedule EP follow up.    Shirley Friar, PA-C  12/10/17   Patient seen and examined with the above-signed Advanced Practice Provider and/or Housestaff. I personally reviewed laboratory data, imaging studies and relevant notes. I independently examined the patient and formulated the important aspects of the plan. I have edited the note to reflect any of my changes or salient points. I have personally discussed the plan with the patient and/or family.  ICD interrogated personally. Shows 1 episode of VT around 170 with failed ATP followed by successful shock . K 3.7 but no other clear triggers. No suction events on VAD. Volume status mildly elevated in setting of increased fluid intake.   Will supp K. Have changed ICD therapy zones. Will restart amio for now. Discussed need to limit fluid intake.   INR 2.3 on 10/9. Discussed dosing with PharmD personally. LDH stable. D/w Dr. [name] in EP personally.   Glori Bickers, MD  12:56 PM

## 2017-12-10 NOTE — Progress Notes (Addendum)
Patient presents for 3 week follow up in [facility] today. Reports no problems with VAD equipment or concerns with drive line.   Pt states that he had a "bad" nosebleed last Friday (he called the clinic and spoke with me at that time and was instructed to hold pressure for 15 min; bleeding resolved with pressure.) He had another nose bleed yesterday, but "it wasn't as bad as Friday." He also notes that when he wipes after a bowel movement he sometimes sees "small amount of red spotting" on the toilet paper. No blood noted in stools.   Yesterday evening around 5:30pm his defibrillator fired. He was laying down taking a nap at the time of the event. He said he had been feeling in his normal state of health prior to the event. He sent a transmission from home. Today in clinic his device was interrogated by [facility] with Medtronic. Interrogation shows that he had been in atrial fib for the last 5-6 months. Yesterday evening his rate increased to the 180s. Device attempted to pace out x4. Atrial tach progressed to VT. Defibrillated x1. Currently in sinus rhythm with a rate in the 90s. Threshold zones on device optimized.   Started cardiac rehab this week. His schedule is MWF at 11:15.   Patient and caregiver surveys completed today during visit.   Vital Signs:  Doppler Pressure: 90 Automatc BP: 109/88 (95) HR:  92; regular SPO2: 98 %  Weight: 234.6lb w/ eqt Last weight: 233.0 lb w/o eqip Home weights: 217-220lbs   VAD Indication: Destination Therapy- Implanted 08/16/17   LVAD assessment: HM III:  Primary Controller  VAD Speed: 5300 rpms Flow: 3.8 Power: 3.8 w    PI: 4.4 Alarms: no clinical alarms  Events: 0-5 PI daily  Fixed speed: 5300 Low speed limit: 5000  Primary: Back up battery expiring in 29 months. Secondary: Back up battery expiring in  31 months    I reviewed the LVAD parameters from today and compared the results to the patient's prior recorded data. LVAD  interrogation was NEGATIVE for significant power changes, NEGATIVE for clinical alarms and STABLE for PI events/speed drops. Hematocrit was updated, but no other changes were made and pump is functioning within specified parameters. Pt is performing daily controller and system monitor self tests along with completing weekly and monthly maintenance for LVAD equipment.  LVAD equipment check completed and is in good working order. Back-up equipment present. Performed self-test on equipment.   Annual Equipment Maintenance on UBC/PM was performed on 09/17/17.   Exit Site Care:  VAD dressing and anchor removed and site care performed using sterile technique. Drive line exit site cleaned with Chlora prep applicators x 2, allowed to dry, skin protectant applied and allowed to dry before gauze dressing and Aquacel silver strip re-applied. Exit site healing and partially incorporated.The velour is exposed 1/2 inch at exit site. No redness, tenderness, or foul odor noted. Small amount of yellow drainage noted on gauze. Drive line anchor re-applied. Pt denies fever or chills. Advance to changing twice a week; Monday & Thursday.   Buttock Wound Care: [name] is  Changing dressing MWF on his buttock wounds with Aquacel and gauze. Removed dressing. Wiped area with with sterile saline wipes x2. Allowed to dry. Covered wound bed with Aquacel pad. Covered with ABD pad. Will continue MWF dressing changes.           Significant Events on VAD Support:    Device: Medtronic 3 Lead Therapies: on Last check: 10/17/16  Labs:   Hgb 8.8 - see above description  LDH stable at 219  with a baseline ranging from 170-250.  Denies tea-colored urine. No power elevations noted on interrogation.   INR- 2.3 on 12/08/17 - within range of 2-2.5.  Magnesium- 2.0       Potassium- 3.7  Plan: 1. Start Amiodarone 200mg  BID.  2. Increase Potassium Chloride to 9meq MWF with Torsamide. Take an additonal 18meq today.    3.Continue every other day buttock dressing changes.  4. Continue Monday/Thusday dressing changes on drive line. Instructed [name] that if drainage increases, to increase frequency of dressing changes back to MWF.   5. Return to [facility] in 3 weeks.   Emerson Monte RN [place] Coordinator  Office: [phone]  24/7 Pager: [phone]

## 2017-12-10 NOTE — Telephone Encounter (Signed)
Called patient and informed him that he has an appointment with Dr. Tanna Furry office Monday October 14th at [facility]. He verbalized understand.   Emerson Monte RN [place] Coordinator  Office: [phone]  24/7 Pager: [phone]

## 2017-12-13 ENCOUNTER — Encounter (HOSPITAL_COMMUNITY): Payer: PPO

## 2017-12-13 ENCOUNTER — Ambulatory Visit: Payer: PPO | Admitting: Physician Assistant

## 2017-12-13 ENCOUNTER — Other Ambulatory Visit: Payer: Self-pay | Admitting: Pharmacist

## 2017-12-13 VITALS — BP 78/58 | HR 86 | Ht 70.0 in | Wt 234.0 lb

## 2017-12-13 DIAGNOSIS — I255 Ischemic cardiomyopathy: Secondary | ICD-10-CM | POA: Diagnosis not present

## 2017-12-13 DIAGNOSIS — I472 Ventricular tachycardia, unspecified: Secondary | ICD-10-CM

## 2017-12-13 DIAGNOSIS — Z95810 Presence of automatic (implantable) cardiac defibrillator: Secondary | ICD-10-CM | POA: Diagnosis not present

## 2017-12-13 DIAGNOSIS — I4819 Other persistent atrial fibrillation: Secondary | ICD-10-CM

## 2017-12-13 NOTE — Patient Outreach (Signed)
[facility] [facility]) Care Management  12/13/2017  [name] [name] [birth year] [id number]  Reason for referral: triple fail medication adherence patient from [place] list  Unsuccessful telephone call attempt #3 to patient.   Unsuccessful outreach call outcomes: HIPPAA compliant voicemail left requesting a return call.    Confirmed with cardiologist that losartan and atorvastatin have been discontinued.    [facility] pharmacy case will be closed due to:  We have been unable to establish and/or maintain contact with the patient. Patient no longer taking 2 medications in the adherence quality metrics. Ralene Bathe, PharmD, [place] [phone]

## 2017-12-13 NOTE — Progress Notes (Signed)
No ICM remote transmission received for 12/02/2017 and next ICM transmission scheduled for 02/10/2018.  Defib office check on 12/13/2017 with Tommye Standard, PA and 01/10/2018 with Dr [name].

## 2017-12-13 NOTE — Progress Notes (Signed)
Cardiology Office Note Date:  12/13/2017  Patient ID:  [name] [name] Aug 15, 1950, MRN [id number] PCP:  [name], NP  Cardiologist:  Dr. [name] Electrophysiologist:Dr. [name]    Chief Complaint: f/u on VT episode  History of Present Illness: [name] [name] is a 67 y.o. male with history of CAD (remote PCI), ICM, chronic CHF (systolic), LVAD (HM3) implanted 08/16/17, AFib, VT, DM, HTN, HLD, CDK (III).  He comes in today to be seen for Dr. [name].  Last seen by him Jan 2018, noting at that visit his RV outputs programmed subthreshold to LV pace only.  Since then he has had HF hospitalizations, eventually a LVAD on 08/16/17. Chest was unable to be closed due to high intrathoracic pressure and RV failure. He returned to the OR on 6/18 for evacuation of hematoma. Chest was later closed on 6/20. Post operative course complicated by open chest, RV failure, recurrent VT, respiratory failure, and dysphagia. CCM consulted for vent management with extubation completed on 08/26/17. As he improved all drips gradually weaned off. Prior to d/c ramp echo was completed with speed optimized.  Most recently he saw Dr. [name] 12/10/17, this an add on visit 2/2 ICD shock.  He was noted to have had VT rates 170-180's ATP x4 failed > shock.  Noted also he had been if AF for 5 months and his Optivol was up. He was counseled on his fluid restriction diuretics continued unchanged.  He was started (restarted by note) on amiodarone his VT sones changed to ATP zone 158 - 200, ATP + shock 200 - 240, and VF/shock for anything > 240 (no reports in communication with Dr. [name])  K+ 3.7 (given an extra dose that visit) and maintenance dose adjusted on his torsemide days, Mag 2.0  He has not had any further shocks.  Reports that he was sleeping when he was shocked, woke him.  No CP, palpitations.  No rest SOB, some DOE, he feels like he is at his baseline.  No near syncope or syncope. Tolerating his  meds.  He and his wife report they are following his PRN diuretic regime as discussed with Dr. [name].   Device information: Medtronic CRT-D, RA/LV leads 02/05/14, RV lead 2007 + hx of appropriate shocks AAD hx Sotalol stopped July 2019 Amiodarone started 11/2017, current AF hx Aflutter ablation 2014  Past Medical History:  Diagnosis Date  . AICD (automatic cardioverter/defibrillator) present 02/05/2014   Upgrade to Medtronic biventricular ICD, serial number  [id number]   . Atrial flutter ([place]) 04/2012   s/p TEE-EPS+RFCA 04/2012  . CAD (coronary artery disease) 2671,2458 X 2    RCA-T, 70% PL (off CFX), 99% Prox LAD/90% Dist LAD, S/P TAXUS stent x 2  . CHF (congestive heart failure) ([place])   . Chronic anticoagulation   . Chronic systolic heart failure ([place])   . CKD (chronic kidney disease)   . Diabetic retinopathy ([place])   . DM type 2 (diabetes mellitus, type 2) (HCC)    insulin dependent  . HTN (hypertension)   . Hypercholesteremia    ablation  . ICD (implantable cardiac defibrillator) in place   . Ischemic cardiomyopathy March 2015   20-25% 2D   . Nephrolithiasis   . Ventricular tachycardia [facility])     Past Surgical History:  Procedure Laterality Date  . ATRIAL FLUTTER ABLATION N/A 05/19/2012   Procedure: ATRIAL FLUTTER ABLATION;  Surgeon: Thompson Grayer, MD;  Location: [facility] CATH LAB;  Service: Cardiovascular;  Laterality: N/A;  .  BI-VENTRICULAR IMPLANTABLE CARDIOVERTER DEFIBRILLATOR UPGRADE N/A 02/05/2014   Procedure: BI-VENTRICULAR IMPLANTABLE CARDIOVERTER DEFIBRILLATOR UPGRADE;  Surgeon: Evans Lance, MD;  Location: [facility] CATH LAB;  Service: Cardiovascular;  Laterality: N/A;  . BIV ICD GENERTAOR CHANGE OUT  02/05/2014   Upgrade to Medtronic biventricular ICD, serial number  [id number] by Dr. [name]  . CARDIAC DEFIBRILLATOR PLACEMENT  2007    Medtronic Maximo VR, serial number [id number]  . INSERTION OF IMPLANTABLE LEFT VENTRICULAR ASSIST DEVICE N/A 08/16/2017   Procedure:  INSERTION OF IMPLANTABLE LEFT VENTRICULAR ASSIST DEVICE-HM3;  Surgeon: Ivin Poot, MD;  Location: [place];  Service: Open Heart Surgery;  Laterality: N/A;  . IR FLUORO GUIDE CV LINE RIGHT  08/12/2017  . IR US GUIDE VASC ACCESS RIGHT  08/12/2017  . MEDIASTINAL EXPLORATION  08/17/2017   Procedure: MEDIASTINAL REXPLORATION with evacuation of hematoma;  Surgeon: Prescott Gum, Collier Salina, MD;  Location: [facility] OR;  Service: Open Heart Surgery;;  . PERCUTANEOUS CORONARY STENT INTERVENTION (PCI-S)  January 2002   PTCA/Stent Distal RCA  . PERCUTANEOUS CORONARY STENT INTERVENTION (PCI-S)  June 2002   PTCA/Stent x 3 RCA, thrombolysis - failed  . PERCUTANEOUS CORONARY STENT INTERVENTION (PCI-S)  July 2006   TAXUS stents to prox and distal LAD  . RIGHT HEART CATH N/A 07/22/2017   Procedure: RIGHT HEART CATH;  Surgeon: Jolaine Artist, MD;  Location: [place] CV LAB;  Service: Cardiovascular;  Laterality: N/A;  . RIGHT HEART CATH N/A 08/13/2017   Procedure: RIGHT HEART CATH - swan;  Surgeon: Jolaine Artist, MD;  Location: [place] CV LAB;  Service: Cardiovascular;  Laterality: N/A;  . RIGHT/LEFT HEART CATH AND CORONARY ANGIOGRAPHY N/A 02/18/2017   Procedure: RIGHT/LEFT HEART CATH AND CORONARY ANGIOGRAPHY;  Surgeon: Jolaine Artist, MD;  Location: [place] CV LAB;  Service: Cardiovascular;  Laterality: N/A;  . STERNAL CLOSURE N/A 08/19/2017   Procedure: STERNAL CLOSURE;  Surgeon: Ivin Poot, MD;  Location: [place];  Service: Thoracic;  Laterality: N/A;  . TEE WITHOUT CARDIOVERSION N/A 08/16/2017   Procedure: TRANSESOPHAGEAL ECHOCARDIOGRAM (TEE);  Surgeon: Prescott Gum, Collier Salina, MD;  Location: [place];  Service: Open Heart Surgery;  Laterality: N/A;  . TEE WITHOUT CARDIOVERSION N/A 08/19/2017   Procedure: TRANSESOPHAGEAL ECHOCARDIOGRAM (TEE);  Surgeon: Prescott Gum, Collier Salina, MD;  Location: [place];  Service: Thoracic;  Laterality: N/A;  . TRICUSPID VALVE REPLACEMENT N/A 08/16/2017   Procedure: TRICUSPID VALVE  REPAIR using Oletta Lamas MC3 Ring size 30;  Surgeon: Ivin Poot, MD;  Location: [place];  Service: Open Heart Surgery;  Laterality: N/A;    Current Outpatient Medications  Medication Sig Dispense Refill  . amiodarone (PACERONE) 200 MG tablet Take 1 tablet (200 mg total) by mouth 2 (two) times daily. 90 tablet 3  . ascorbic acid (VITAMIN C) 500 MG tablet Take 1 tablet (500 mg total) by mouth 2 (two) times daily. 100 tablet 0  . aspirin 81 MG chewable tablet Chew 1 tablet (81 mg total) by mouth daily.    Marland Kitchen gabapentin (NEURONTIN) 100 MG capsule Take 1 capsule (100 mg total) by mouth at bedtime. (Patient taking differently: Take 300 mg by mouth at bedtime. ) 90 capsule 10  . gabapentin (NEURONTIN) 300 MG capsule Take 1 capsule (300 mg total) by mouth 2 (two) times daily. 180 capsule 3  . Insulin Pen Needle (COMFORT EZ PEN NEEDLES) 32G X 6 MM MISC 1 application by Does not apply route at bedtime. 100 each 0  . magnesium oxide (MAG-OX)  400 (241.3 Mg) MG tablet Take 1 tablet (400 mg total) by mouth 2 (two) times daily. 60 tablet 0  . Multiple Vitamin (MULTIVITAMIN WITH MINERALS) TABS tablet Take 1 tablet by mouth daily.    . potassium chloride (K-DUR) 10 MEQ tablet Take 2 tablets (20 mEq total) by mouth every Monday, Wednesday, and Friday. 90 tablet 3  . protein supplement shake (PREMIER PROTEIN) LIQD Take 325 mLs (11 oz total) by mouth 2 (two) times daily between meals.  0  . spironolactone (ALDACTONE) 25 MG tablet Take 1 tablet (25 mg total) by mouth daily. 30 tablet 11  . torsemide (DEMADEX) 20 MG tablet Take 2 tablets (40 mg total) by mouth every Monday, Wednesday, and Friday. 90 tablet 0  . warfarin (COUMADIN) 2.5 MG tablet Take 1 tablet (2.5 mg) daily except 2 tablets (5 mg) on Wed     No current facility-administered medications for this visit.     Allergies:   Penicillins   Social History:  The patient  reports that he quit smoking about 17 years ago. His smoking use included cigarettes. He  has a 29.00 pack-year smoking history. He has never used smokeless tobacco. He reports that he does not drink alcohol or use drugs.   Family History:  The patient's family history includes Alzheimer's disease in his mother; CAD in his brother, brother, brother, and brother; Diabetes in his brother; Healthy in his daughter and son; Heart attack in his mother; Heart failure in his father; Other in his brother; Prostate cancer in his brother.  ROS:  Please see the history of present illness. All other systems are reviewed and otherwise negative.   PHYSICAL EXAM:  VS:  BP (!) 78/58   Pulse 86   Ht 5\' 10"  (1.778 m)   Wt 234 lb (106.1 kg)   SpO2 95%   BMI 33.58 kg/m  BMI: Body mass index is 33.58 kg/m. Well nourished, well developed, in no acute distress  HEENT: normocephalic, atraumatic  Neck: no JVD, carotid bruits or masses Cardiac:  VAD humm appreciated Lungs:  Diminished at the basesl, no wheezing, rhonchi or rales  Abd: soft, nontender MS: no deformity or atrophy Ext: 1+ edema  Skin: warm and dry, no rash Neuro:  No gross deficits appreciated Psych: euthymic mood, full affect  ICD site is stable, no tethering or discomfort   EKG:  Not done today ICD interrogation done today and reviewed by myself: battery and lead measurements are good.  RV outputs are programmed chronically subthreshold, no VT, no AF, arrives in SR, VP 96.5%   RHC 08/13/17 On milrinone 0.375 mcg/kg/min RA = 12 RV = 48/13 PA = 50/26 (35) PCW = 20 Fick cardiac output/index = 5.1/2.5 PVR = 3.0 Ao sat = 93% PA sat = 56%, 57% PAPI = 2.0 RA/PCWP = 0.60  R/LHC 01/2017  Prox RCA to Mid RCA lesion is 100% stenosed.  Prox RCA lesion is 100% stenosed.  Ost Cx to Prox Cx lesion is 40% stenosed.  Dist Cx lesion is 70% stenosed.  Ost 1st Mrg to 1st Mrg lesion is 40% stenosed.  1st Mrg lesion is 70% stenosed.  Prox LAD lesion is 50% stenosed.  Prox LAD to Mid LAD lesion is 60%  stenosed.  Findings: Ao = 99/56 (73) LV = 101/5 RA = 1 RV = 29/1 PA = 23/11 (16) PCW = 6 Fick cardiac output/index = 3.8/1.7 Thermo CO/CI = 4.1/1.9 PVR = 2.6 Ao sat = 99% PA sat = 60%, 61%  Assessment: -3v CAD with 1) LAD diffuse 60% stenosis 2) LCX multiple 40% lesions 3) RCA chronically occluded proximally with L to R colllterals  Recent Labs: 02/17/2017: TSH 5.389 09/20/2017: B Natriuretic Peptide 56.0 12/03/2017: ALT 21 12/10/2017: BUN 35; Creatinine, Ser 1.14; Hemoglobin 8.8; Magnesium 2.0; Platelets 195; Potassium 3.7; Sodium 135  No results found for requested labs within last 8760 hours.   Estimated Creatinine Clearance: 76.7 mL/min (by C-G formula based on SCr of 1.14 mg/dL).   Wt Readings from Last 3 Encounters:  12/13/17 234 lb (106.1 kg)  12/10/17 234 lb 9.6 oz (106.4 kg)  12/09/17 235 lb 10.8 oz (106.9 kg)     Other studies reviewed: Additional studies/records reviewed today include: summarized above  ASSESSMENT AND PLAN:  1. CRT-D     No programming changes made  2. VT     None further     Continue amiodarone 200mg  BID for now He drives minimally Discussed no driving 6 months given VT/shock. Not likely that he would have syncope given LVAD though  3. Persistent AFib     CHA2DS2Vasc is at least 5, on warfarin     SR today  4. Chronic CHF, ICM     LVAD     Follows closely with AHF/LVAD team, warfarin/labs at [facility] clinic.    Disposition: F/u with [place] in 3-4 weeks, if no further VT, perhaps reduce amio to daily.  Current medicines are reviewed at length with the patient today.  The patient did not have any concerns regarding medicines.  Venetia Night, PA-C 12/13/2017 9:29 AM     [place] [address] [phone] (office)  [phone] (fax)

## 2017-12-13 NOTE — Patient Instructions (Signed)
Medication Instructions:   Your physician recommends that you continue on your current medications as directed. Please refer to the Current Medication list given to you today.   If you need a refill on your cardiac medications before your next appointment, please call your pharmacy.  Labwork: NONE ORDERED  TODAY    Testing/Procedures: NONE ORDERED  TODAY    Follow-Up:   WITH [facility] IN 3 TO 4 WEEKS ( IF NOT SEND TO [facility])   Any Other Special Instructions Will Be Listed Below (If Applicable).

## 2017-12-14 ENCOUNTER — Telehealth (HOSPITAL_COMMUNITY): Payer: Self-pay

## 2017-12-14 NOTE — Telephone Encounter (Signed)
I called [name] [name] to schedule an appointment. He did not answer so I left a voicemail requesting he call me back.

## 2017-12-14 NOTE — Telephone Encounter (Signed)
[name] [name] returned my phone call and said that he had a close friend die and he was planning to attend the funeral on Friday. He asked if we could move his appointment to next Friday. I will reach out next week to establish a time for that meeting.

## 2017-12-15 ENCOUNTER — Encounter (HOSPITAL_COMMUNITY)
Admission: RE | Admit: 2017-12-15 | Discharge: 2017-12-15 | Disposition: A | Discharge status: Home / Self Care | Payer: PPO | Source: Ambulatory Visit | Attending: Internal Medicine | Admitting: Internal Medicine

## 2017-12-15 ENCOUNTER — Encounter (HOSPITAL_COMMUNITY): Payer: PPO

## 2017-12-15 DIAGNOSIS — I5022 Chronic systolic (congestive) heart failure: Secondary | ICD-10-CM

## 2017-12-15 DIAGNOSIS — Z95811 Presence of heart assist device: Secondary | ICD-10-CM

## 2017-12-15 LAB — GLUCOSE, CAPILLARY
GLUCOSE-CAPILLARY: 213 mg/dL — AB (ref 70–99)
Glucose-Capillary: 184 mg/dL — ABNORMAL HIGH (ref 70–99)

## 2017-12-15 NOTE — Progress Notes (Signed)
[name] [name] 67 y.o. male DOB 1951-01-25 MRN [id number]       Nutrition  1. Heart failure, chronic systolic ([place])   2. Left ventricular assist device present [facility])    Past Medical History:  Diagnosis Date  . AICD (automatic cardioverter/defibrillator) present 02/05/2014   Upgrade to Medtronic biventricular ICD, serial number  [id number]   . Atrial flutter ([place]) 04/2012   s/p TEE-EPS+RFCA 04/2012  . CAD (coronary artery disease) 3545,6256 X 2    RCA-T, 70% PL (off CFX), 99% Prox LAD/90% Dist LAD, S/P TAXUS stent x 2  . CHF (congestive heart failure) ([place])   . Chronic anticoagulation   . Chronic systolic heart failure ([place])   . CKD (chronic kidney disease)   . Diabetic retinopathy ([place])   . DM type 2 (diabetes mellitus, type 2) (HCC)    insulin dependent  . HTN (hypertension)   . Hypercholesteremia    ablation  . ICD (implantable cardiac defibrillator) in place   . Ischemic cardiomyopathy March 2015   20-25% 2D   . Nephrolithiasis   . Ventricular tachycardia ([place])    Meds reviewed.     Current Outpatient Medications (Cardiovascular):  .  amiodarone (PACERONE) 200 MG tablet, Take 1 tablet (200 mg total) by mouth 2 (two) times daily. Marland Kitchen  spironolactone (ALDACTONE) 25 MG tablet, Take 1 tablet (25 mg total) by mouth daily. Marland Kitchen  torsemide (DEMADEX) 20 MG tablet, Take 2 tablets (40 mg total) by mouth every Monday, Wednesday, and Friday.   Current Outpatient Medications (Analgesics):  .  aspirin 81 MG chewable tablet, Chew 1 tablet (81 mg total) by mouth daily.  Current Outpatient Medications (Hematological):  .  warfarin (COUMADIN) 2.5 MG tablet, Take 1 tablet (2.5 mg) daily except 2 tablets (5 mg) on Wed  Current Outpatient Medications (Other):  .  ascorbic acid (VITAMIN C) 500 MG tablet, Take 1 tablet (500 mg total) by mouth 2 (two) times daily. Marland Kitchen  gabapentin (NEURONTIN) 100 MG capsule, Take 1 capsule (100 mg total) by mouth at bedtime. (Patient taking differently: Take  300 mg by mouth at bedtime. ) .  gabapentin (NEURONTIN) 300 MG capsule, Take 1 capsule (300 mg total) by mouth 2 (two) times daily. .  Insulin Pen Needle (COMFORT EZ PEN NEEDLES) 32G X 6 MM MISC, 1 application by Does not apply route at bedtime. .  magnesium oxide (MAG-OX) 400 (241.3 Mg) MG tablet, Take 1 tablet (400 mg total) by mouth 2 (two) times daily. .  Multiple Vitamin (MULTIVITAMIN WITH MINERALS) TABS tablet, Take 1 tablet by mouth daily. .  potassium chloride (K-DUR) 10 MEQ tablet, Take 2 tablets (20 mEq total) by mouth every Monday, Wednesday, and Friday. .  protein supplement shake (PREMIER PROTEIN) LIQD, Take 325 mLs (11 oz total) by mouth 2 (two) times daily between meals.   HT: Ht Readings from Last 1 Encounters:  12/13/17 5\' 10"  (1.778 m)    WT: Wt Readings from Last 5 Encounters:  12/13/17 234 lb (106.1 kg)  12/10/17 234 lb 9.6 oz (106.4 kg)  12/09/17 235 lb 10.8 oz (106.9 kg)  11/26/17 217 lb (98.4 kg)  11/24/17 230 lb 8 oz (104.6 kg)     Body mass index is 33.82 kg/m.   Current tobacco use? No       Labs:  Lipid Panel     Component Value Date/Time   CHOL 116 04/27/2016 1331   TRIG 57 04/27/2016 1331   HDL 31 (L) 04/27/2016 1331  CHOLHDL 3.7 04/27/2016 1331   VLDL 11 04/27/2016 1331   LDLCALC 74 04/27/2016 1331   LDLDIRECT 197.0 07/10/2011 0957    Lab Results  Component Value Date   HGBA1C 6.3 (A) 11/11/2017   CBG (last 3)  No results for input(s): GLUCAP in the last 72 hours.  Nutrition Diagnosis ? Food-and nutrition-related knowledge deficit related to lack of exposure to information as related to diagnosis of: ? CVD ? CHF ? Type 2 Diabetes ? Obese  I = 30-34.9 related to excessive energy intake as evidenced by a Body mass index is 33.82 kg/m.  Nutrition Goal(s):  ? Pt to identify food quantities necessary to achieve weight loss of 6-24 lb at graduation from cardiac rehab.  ? Pt to describe the benefit of including fruits, vegetables, whole  grains, and low-fat dairy products in a heart healthy meal plan. ? Pt to use a heart healthy oil for cooking ? Pt to choose more whole grains over refined grains   Plan:  Pt to attend nutrition classes ? Nutrition I ? Nutrition II ? Portion Distortion  ? Diabetes Blitz ? Diabetes Q & A Will provide client-centered nutrition education as part of interdisciplinary care.   Monitor and evaluate progress toward nutrition goal with team.  Laurina Bustle, MS, RD, LDN 12/15/2017 8:34 AM

## 2017-12-15 NOTE — Progress Notes (Signed)
Daily Session Note  Patient Details  Name: [name] [name] MRN: [id number] Date of Birth: [birth year] Referring Provider:     CARDIAC REHAB PHASE II ORIENTATION from 12/09/2017 in [place]  Referring Provider  Glori Bickers MD      Encounter Date: 12/15/2017  Check In: Session Check In - 12/15/17 1218      Check-In   Supervising physician immediately available to respond to emergencies  [facility] immediately available    Physician(s)  Dr. Ree Kida     Location  [facility]    Staff Present  Dorma Russell, MS,ACSM CEP, Exercise Physiologist;Maria Whitaker, RN, Toma Deiters, RN, BSN;Joann Rion, RN, BSN;Brittany Vance, BS, ACSM CEP, Exercise Physiologist    Medication changes reported      No    Fall or balance concerns reported     No    Tobacco Cessation  No Change    Warm-up and Cool-down  Performed as group-led instruction    Resistance Training Performed  No    VAD Patient?  No    PAD/SET Patient?  No      Pain Assessment   Currently in Pain?  No/denies       Capillary Blood Glucose: Results for orders placed or performed during the hospital encounter of 12/15/17 (from the past 24 hour(s))  Glucose, capillary     Status: Abnormal   Collection Time: 12/15/17 11:34 AM  Result Value Ref Range   Glucose-Capillary 213 (H) 70 - 99 mg/dL  Glucose, capillary     Status: Abnormal   Collection Time: 12/15/17 12:27 PM  Result Value Ref Range   Glucose-Capillary 184 (H) 70 - 99 mg/dL    Exercise Prescription Changes - 12/15/17 1300      Response to Exercise   Blood Pressure (Admit)  --   Doppler:84   Blood Pressure (Exercise)  --   Doppler:86   Blood Pressure (Exit)  --   Doppler: 72   Heart Rate (Admit)  76 bpm    Heart Rate (Exercise)  86 bpm    Heart Rate (Exit)  76 bpm    Rating of Perceived Exertion (Exercise)  13    Perceived Dyspnea (Exercise)  0    Symptoms  None    Comments  Pt oriented to  exercise equipment     Duration  Progress to 30 minutes of  aerobic without signs/symptoms of physical distress    Intensity  THRR unchanged      Progression   Progression  Continue to progress workloads to maintain intensity without signs/symptoms of physical distress.    Average METs  1.65      Resistance Training   Training Prescription  No      Interval Training   Interval Training  No      NuStep   Level  1    SPM  75    Minutes  10    METs  1.6      Arm Ergometer   Level  1    Watts  10    Minutes  10    METs  1.5      Track   Laps  4    Minutes  10    METs  1.7       Social History   Tobacco Use  Smoking Status Former Smoker  . Packs/day: 1.00  . Years: 29.00  . Pack years: 29.00  . Types: Cigarettes  . Last  attempt to quit: 03/02/2000  . Years since quitting: 17.8  Smokeless Tobacco Never Used    Goals Met:  Exercise tolerated well  Goals Unmet:  Not Applicable  Comments: Pt started cardiac rehab today.  Pt tolerated light exercise without difficulty. VSS, telemetry-V paced, asymptomatic.  Medication list reconciled. Pt denies barriers to medicaiton compliance.  PSYCHOSOCIAL ASSESSMENT:  PHQ-0. Pt exhibits positive coping skills, hopeful outlook with supportive family. No psychosocial needs identified at this time, no psychosocial interventions necessary.  Pt oriented to exercise equipment and routine.    Understanding verbalized.    Dr. Fransico Him is Medical Director for Cardiac Rehab at [facility].

## 2017-12-16 ENCOUNTER — Ambulatory Visit (HOSPITAL_COMMUNITY): Payer: Self-pay | Admitting: Pharmacist

## 2017-12-16 DIAGNOSIS — Z7901 Long term (current) use of anticoagulants: Secondary | ICD-10-CM

## 2017-12-16 LAB — POCT INR: INR: 2.3 (ref 2.0–3.0)

## 2017-12-17 ENCOUNTER — Encounter (HOSPITAL_COMMUNITY): Payer: PPO

## 2017-12-20 ENCOUNTER — Encounter (HOSPITAL_COMMUNITY)
Admission: RE | Admit: 2017-12-20 | Discharge: 2017-12-20 | Disposition: A | Discharge status: Home / Self Care | Payer: PPO | Source: Ambulatory Visit | Attending: Internal Medicine | Admitting: Internal Medicine

## 2017-12-20 ENCOUNTER — Encounter (HOSPITAL_COMMUNITY): Payer: PPO

## 2017-12-20 DIAGNOSIS — Z95811 Presence of heart assist device: Secondary | ICD-10-CM | POA: Diagnosis not present

## 2017-12-20 DIAGNOSIS — I5022 Chronic systolic (congestive) heart failure: Secondary | ICD-10-CM

## 2017-12-20 LAB — GLUCOSE, CAPILLARY: GLUCOSE-CAPILLARY: 237 mg/dL — AB (ref 70–99)

## 2017-12-22 ENCOUNTER — Encounter (HOSPITAL_COMMUNITY): Payer: PPO

## 2017-12-22 ENCOUNTER — Telehealth (HOSPITAL_COMMUNITY): Payer: Self-pay

## 2017-12-22 ENCOUNTER — Encounter (HOSPITAL_COMMUNITY)
Admission: RE | Admit: 2017-12-22 | Discharge: 2017-12-22 | Disposition: A | Discharge status: Home / Self Care | Payer: PPO | Source: Ambulatory Visit | Attending: Internal Medicine | Admitting: Internal Medicine

## 2017-12-22 DIAGNOSIS — I5022 Chronic systolic (congestive) heart failure: Secondary | ICD-10-CM

## 2017-12-22 DIAGNOSIS — Z95811 Presence of heart assist device: Secondary | ICD-10-CM

## 2017-12-22 NOTE — Telephone Encounter (Signed)
I called [name] [name] to schedule an appointment. He did not answer so I left a message requesting he call me back.

## 2017-12-24 ENCOUNTER — Encounter (HOSPITAL_COMMUNITY): Payer: PPO

## 2017-12-24 ENCOUNTER — Ambulatory Visit (HOSPITAL_COMMUNITY): Payer: Self-pay | Admitting: Pharmacist

## 2017-12-24 ENCOUNTER — Encounter (HOSPITAL_COMMUNITY)
Admission: RE | Admit: 2017-12-24 | Discharge: 2017-12-24 | Disposition: A | Discharge status: Home / Self Care | Payer: PPO | Source: Ambulatory Visit | Attending: Internal Medicine | Admitting: Internal Medicine

## 2017-12-24 DIAGNOSIS — I5022 Chronic systolic (congestive) heart failure: Secondary | ICD-10-CM

## 2017-12-24 DIAGNOSIS — Z95811 Presence of heart assist device: Secondary | ICD-10-CM

## 2017-12-24 DIAGNOSIS — Z7901 Long term (current) use of anticoagulants: Secondary | ICD-10-CM

## 2017-12-24 LAB — POCT INR: INR: 4.9 — AB (ref 2.0–3.0)

## 2017-12-24 LAB — GLUCOSE, CAPILLARY: Glucose-Capillary: 227 mg/dL — ABNORMAL HIGH (ref 70–99)

## 2017-12-25 DIAGNOSIS — I5023 Acute on chronic systolic (congestive) heart failure: Secondary | ICD-10-CM | POA: Diagnosis not present

## 2017-12-25 DIAGNOSIS — I252 Old myocardial infarction: Secondary | ICD-10-CM | POA: Diagnosis not present

## 2017-12-25 DIAGNOSIS — I5043 Acute on chronic combined systolic (congestive) and diastolic (congestive) heart failure: Secondary | ICD-10-CM | POA: Diagnosis not present

## 2017-12-25 DIAGNOSIS — E1121 Type 2 diabetes mellitus with diabetic nephropathy: Secondary | ICD-10-CM | POA: Diagnosis not present

## 2017-12-25 DIAGNOSIS — L89310 Pressure ulcer of right buttock, unstageable: Secondary | ICD-10-CM | POA: Diagnosis not present

## 2017-12-27 ENCOUNTER — Encounter (HOSPITAL_COMMUNITY): Payer: PPO

## 2017-12-27 ENCOUNTER — Encounter (HOSPITAL_COMMUNITY)
Admission: RE | Admit: 2017-12-27 | Discharge: 2017-12-27 | Disposition: A | Discharge status: Home / Self Care | Payer: PPO | Source: Ambulatory Visit | Attending: Internal Medicine | Admitting: Internal Medicine

## 2017-12-27 DIAGNOSIS — Z95811 Presence of heart assist device: Secondary | ICD-10-CM | POA: Diagnosis not present

## 2017-12-27 DIAGNOSIS — I5022 Chronic systolic (congestive) heart failure: Secondary | ICD-10-CM

## 2017-12-27 LAB — GLUCOSE, CAPILLARY: GLUCOSE-CAPILLARY: 121 mg/dL — AB (ref 70–99)

## 2017-12-29 ENCOUNTER — Other Ambulatory Visit (HOSPITAL_COMMUNITY): Payer: Self-pay | Admitting: *Deleted

## 2017-12-29 ENCOUNTER — Encounter (HOSPITAL_COMMUNITY)
Admission: RE | Admit: 2017-12-29 | Discharge: 2017-12-29 | Disposition: A | Discharge status: Home / Self Care | Payer: PPO | Source: Ambulatory Visit | Attending: Internal Medicine | Admitting: Internal Medicine

## 2017-12-29 ENCOUNTER — Encounter (HOSPITAL_COMMUNITY): Payer: PPO

## 2017-12-29 DIAGNOSIS — Z95811 Presence of heart assist device: Secondary | ICD-10-CM

## 2017-12-29 DIAGNOSIS — Z7901 Long term (current) use of anticoagulants: Secondary | ICD-10-CM

## 2017-12-29 DIAGNOSIS — I5022 Chronic systolic (congestive) heart failure: Secondary | ICD-10-CM

## 2017-12-30 ENCOUNTER — Encounter (HOSPITAL_COMMUNITY): Payer: Self-pay

## 2017-12-30 ENCOUNTER — Ambulatory Visit (HOSPITAL_COMMUNITY): Payer: Self-pay | Admitting: Pharmacist

## 2017-12-30 DIAGNOSIS — Z7901 Long term (current) use of anticoagulants: Secondary | ICD-10-CM

## 2017-12-30 LAB — POCT INR: INR: 1.4 — AB (ref 2.0–3.0)

## 2017-12-30 MED ORDER — ENOXAPARIN SODIUM 40 MG/0.4ML ~~LOC~~ SOLN: 40.0000 mg | Freq: Two times a day (BID) | SUBCUTANEOUS | 1 refills | Status: DC

## 2017-12-30 NOTE — Progress Notes (Signed)
Cardiac Individual Treatment Plan  Patient Details  Name: [name] [name] MRN: [id number] Date of Birth: Aug 18, 1950 Referring Provider:     CARDIAC REHAB PHASE II ORIENTATION from 12/09/2017 in [place]  Referring Provider  Glori Bickers MD      Initial Encounter Date:    CARDIAC REHAB PHASE II ORIENTATION from 12/09/2017 in [place]  Date  12/09/17      Visit Diagnosis: Left ventricular assist device present ([place])  Heart failure, chronic systolic ([place])  Patient's Home Medications on Admission:  Current Outpatient Medications:  .  amiodarone (PACERONE) 200 MG tablet, Take 1 tablet (200 mg total) by mouth 2 (two) times daily., Disp: 90 tablet, Rfl: 3 .  ascorbic acid (VITAMIN C) 500 MG tablet, Take 1 tablet (500 mg total) by mouth 2 (two) times daily., Disp: 100 tablet, Rfl: 0 .  aspirin 81 MG chewable tablet, Chew 1 tablet (81 mg total) by mouth daily., Disp: , Rfl:  .  enoxaparin (LOVENOX) 40 MG/0.4ML injection, Inject 0.4 mLs (40 mg total) into the skin every 12 (twelve) hours., Disp: 10 Syringe, Rfl: 1 .  gabapentin (NEURONTIN) 100 MG capsule, Take 1 capsule (100 mg total) by mouth at bedtime. (Patient taking differently: Take 300 mg by mouth at bedtime. ), Disp: 90 capsule, Rfl: 10 .  gabapentin (NEURONTIN) 300 MG capsule, Take 1 capsule (300 mg total) by mouth 2 (two) times daily., Disp: 180 capsule, Rfl: 3 .  Insulin Pen Needle (COMFORT EZ PEN NEEDLES) 32G X 6 MM MISC, 1 application by Does not apply route at bedtime., Disp: 100 each, Rfl: 0 .  magnesium oxide (MAG-OX) 400 (241.3 Mg) MG tablet, Take 1 tablet (400 mg total) by mouth 2 (two) times daily., Disp: 60 tablet, Rfl: 0 .  Multiple Vitamin (MULTIVITAMIN WITH MINERALS) TABS tablet, Take 1 tablet by mouth daily., Disp: , Rfl:  .  potassium chloride (K-DUR) 10 MEQ tablet, Take 2 tablets (20 mEq total) by mouth every Monday, Wednesday, and Friday.,  Disp: 90 tablet, Rfl: 3 .  protein supplement shake (PREMIER PROTEIN) LIQD, Take 325 mLs (11 oz total) by mouth 2 (two) times daily between meals., Disp: , Rfl: 0 .  spironolactone (ALDACTONE) 25 MG tablet, Take 1 tablet (25 mg total) by mouth daily., Disp: 30 tablet, Rfl: 11 .  torsemide (DEMADEX) 20 MG tablet, Take 2 tablets (40 mg total) by mouth every Monday, Wednesday, and Friday., Disp: 90 tablet, Rfl: 0 .  warfarin (COUMADIN) 2.5 MG tablet, Take 2.5 mg by mouth daily. , Disp: , Rfl:   Past Medical History: Past Medical History:  Diagnosis Date  . AICD (automatic cardioverter/defibrillator) present 02/05/2014   Upgrade to Med[employer]onic biventricular ICD, serial number  [id number]   . Atrial flutter ([facility]) 04/2012   s/p TEE-EPS+RFCA 04/2012  . CAD (coronary artery disease) 1191,4782 X 2    RCA-T, 70% PL (off CFX), 99% Prox LAD/90% Dist LAD, S/P TAXUS stent x 2  . CHF (congestive heart failure) ([place])   . Chronic anticoagulation   . Chronic systolic heart failure ([place])   . CKD (chronic kidney disease)   . Diabetic retinopathy ([place])   . DM type 2 (diabetes mellitus, type 2) ([facility])    insulin dependent  . HTN (hypertension)   . Hypercholesteremia    ablation  . ICD (implantable cardiac defibrillator) in place   . Ischemic cardiomyopathy March 2015   20-25% 2D   . Nephrolithiasis   .  Ventricular tachycardia ([facility])     Tobacco Use: Social History   Tobacco Use  Smoking Status Former Smoker  . Packs/day: 1.00  . Years: 29.00  . Pack years: 29.00  . Types: Cigarettes  . Last attempt to quit: 03/02/2000  . Years since quitting: 17.8  Smokeless Tobacco Never Used    Labs: Recent Chemical engineer    Labs for ITP Cardiac and Pulmonary Rehab Latest Ref Rng & Units 09/06/2017 09/07/2017 09/08/2017 09/09/2017 11/11/2017   Cholestrol <200 mg/dL - - - - -   LDLCALC <100 mg/dL - - - - -   LDLDIRECT mg/dL - - - - -   HDL >40 mg/dL - - - - -   Trlycerides <150 mg/dL - - - - -   Hemoglobin  A1c 4.0 - 5.6 % - - - - 6.3(A)   PHART 7.350 - 7.450 - - - - -   PCO2ART 32.0 - 48.0 mmHg - - - - -   HCO3 20.0 - 28.0 mmol/L - - - - -   TCO2 22 - 32 mmol/L - - - - -   ACIDBASEDEF 0.0 - 2.0 mmol/L - - - - -   O2SAT % 61.5 54.4 60.9 52.1 -      Capillary Blood Glucose: Lab Results  Component Value Date   GLUCAP 121 (H) 12/27/2017   GLUCAP 227 (H) 12/24/2017   GLUCAP 237 (H) 12/20/2017   GLUCAP 184 (H) 12/15/2017   GLUCAP 213 (H) 12/15/2017     Exercise Target Goals: Exercise Program Goal: Individual exercise prescription set using results from initial 6 min walk test and THRR while considering  patient's activity barriers and safety.   Exercise Prescription Goal: Initial exercise prescription builds to 30-45 minutes a day of aerobic activity, 2-3 days per week.  Home exercise guidelines will be given to patient during program as part of exercise prescription that the participant will acknowledge.  Activity Barriers & Risk Stratification: Activity Barriers & Cardiac Risk Stratification - 12/09/17 1131      Activity Barriers & Cardiac Risk Stratification   Activity Barriers  Deconditioning;Muscular Weakness    Cardiac Risk Stratification  High       6 Minute Walk: 6 Minute Walk    Row Name 12/09/17 1007 12/09/17 1127       6 Minute Walk   Phase  Initial  Initial    Distance  -  685 feet    Walk Time  -  4.15 minutes    # of Rest Breaks  -  1    MPH  -  1.29    METS  -  1.34    RPE  -  12    Perceived Dyspnea   -  0    VO2 Peak  -  4.7    Symptoms  -  Yes (comment)    Comments  -  Leg Fatigue     Resting HR  -  89 bpm    Resting BP  -  - MAP: 84    Resting Oxygen Saturation   -  96 %    Max Ex. HR  -  103 bpm    Max Ex. BP  -  - MAP: 88    2 Minute Post BP  -  - MAP: 78       Oxygen Initial Assessment:   Oxygen Re-Evaluation:   Oxygen Discharge (Final Oxygen Re-Evaluation):   Initial Exercise Prescription: Initial Exercise Prescription -  12/09/17  1100      Date of Initial Exercise RX and Referring Provider   Date  12/09/17    Referring Provider  Glori Bickers MD    Expected Discharge Date  03/18/18      NuStep   Level  1    SPM  75    Minutes  10    METs  1.5      Arm Ergometer   Level  1    Watts  10    Minutes  10    METs  1.5      Track   Laps  5    Minutes  10    METs  1.84      Prescription Details   Frequency (times per week)  3    Duration  Progress to 30 minutes of continuous aerobic without signs/symptoms of physical distress      Intensity   THRR 40-80% of Max Heartrate  61-122    Ratings of Perceived Exertion  11-13    Perceived Dyspnea  0-4      Progression   Progression  Continue progressive overload as per policy without signs/symptoms or physical distress.      Resistance Training   Training Prescription  Yes    Weight  2    Reps  10-15       Perform Capillary Blood Glucose checks as needed.  Exercise Prescription Changes: Exercise Prescription Changes    Row Name 12/15/17 1300 12/29/17 1000           Response to Exercise   Blood Pressure (Admit)  - Doppler:84  - MAP: 80      Blood Pressure (Exercise)  - Doppler:86  - MAP: 84      Blood Pressure (Exit)  - Doppler: 72  - MAP: 80      Heart Rate (Admit)  76 bpm  68 bpm      Heart Rate (Exercise)  86 bpm  81 bpm      Heart Rate (Exit)  76 bpm  68 bpm      Rating of Perceived Exertion (Exercise)  13  12      Perceived Dyspnea (Exercise)  0  0      Symptoms  None  None      Comments  Pt oriented to exercise equipment   None      Duration  Progress to 30 minutes of  aerobic without signs/symptoms of physical distress  Progress to 30 minutes of  aerobic without signs/symptoms of physical distress      Intensity  THRR unchanged  THRR unchanged        Progression   Progression  Continue to progress workloads to maintain intensity without signs/symptoms of physical distress.  Continue to progress workloads to maintain intensity  without signs/symptoms of physical distress.      Average METs  1.65  1.79        Resistance Training   Training Prescription  No  Yes      Weight  -  2lbs      Reps  -  10-15      Time  -  10 Minutes        Interval Training   Interval Training  No  -        NuStep   Level  1  1      SPM  75  75      Minutes  10  10  METs  1.6  1.9        Arm Ergometer   Level  1  1      Watts  10  10      Minutes  10  10      METs  1.5  1.6        Track   Laps  4  5      Minutes  10  10      METs  1.7  1.84         Exercise Comments: Exercise Comments    Row Name 12/15/17 1007           Exercise Comments  Pt's first day of exericse. Pt responded well to exercise prescription. Will continue to monitor and progress pt.           Exercise Goals and Review: Exercise Goals    Row Name 12/09/17 1131             Exercise Goals   Increase Physical Activity  Yes       Intervention  Provide advice, education, support and counseling about physical activity/exercise needs.;Develop an individualized exercise prescription for aerobic and resistive training based on initial evaluation findings, risk stratification, comorbidities and participant's personal goals.       Expected Outcomes  Short Term: Attend rehab on a regular basis to increase amount of physical activity.;Long Term: Add in home exercise to make exercise part of routine and to increase amount of physical activity.;Long Term: Exercising regularly at least 3-5 days a week.       Increase Strength and Stamina  Yes       Intervention  Provide advice, education, support and counseling about physical activity/exercise needs.;Develop an individualized exercise prescription for aerobic and resistive training based on initial evaluation findings, risk stratification, comorbidities and participant's personal goals.       Expected Outcomes  Short Term: Increase workloads from initial exercise prescription for resistance, speed, and  METs.;Short Term: Perform resistance training exercises routinely during rehab and add in resistance training at home;Long Term: Improve cardiorespiratory fitness, muscular endurance and strength as measured by increased METs and functional capacity (6MWT)       Able to understand and use rate of perceived exertion (RPE) scale  Yes       Intervention  Provide education and explanation on how to use RPE scale       Expected Outcomes  Short Term: Able to use RPE daily in rehab to express subjective intensity level;Long Term:  Able to use RPE to guide intensity level when exercising independently       Knowledge and understanding of Target Heart Rate Range (THRR)  Yes       Intervention  Provide education and explanation of THRR including how the numbers were predicted and where they are located for reference       Expected Outcomes  Short Term: Able to state/look up THRR;Short Term: Able to use daily as guideline for intensity in rehab;Long Term: Able to use THRR to govern intensity when exercising independently       Able to check pulse independently  Yes       Intervention  Provide education and demonstration on how to check pulse in carotid and radial arteries.;Review the importance of being able to check your own pulse for safety during independent exercise       Expected Outcomes  Short Term: Able to explain why pulse checking is important during  independent exercise;Long Term: Able to check pulse independently and accurately       Understanding of Exercise Prescription  Yes       Intervention  Provide education, explanation, and written materials on patient's individual exercise prescription       Expected Outcomes  Short Term: Able to explain program exercise prescription;Long Term: Able to explain home exercise prescription to exercise independently          Exercise Goals Re-Evaluation :   Discharge Exercise Prescription (Final Exercise Prescription Changes): Exercise Prescription Changes  - 12/29/17 1000      Response to Exercise   Blood Pressure (Admit)  --   MAP: 80   Blood Pressure (Exercise)  --   MAP: 84   Blood Pressure (Exit)  --   MAP: 80   Heart Rate (Admit)  68 bpm    Heart Rate (Exercise)  81 bpm    Heart Rate (Exit)  68 bpm    Rating of Perceived Exertion (Exercise)  12    Perceived Dyspnea (Exercise)  0    Symptoms  None    Comments  None    Duration  Progress to 30 minutes of  aerobic without signs/symptoms of physical distress    Intensity  THRR unchanged      Progression   Progression  Continue to progress workloads to maintain intensity without signs/symptoms of physical distress.    Average METs  1.79      Resistance Training   Training Prescription  Yes    Weight  2lbs    Reps  10-15    Time  10 Minutes      NuStep   Level  1    SPM  75    Minutes  10    METs  1.9      Arm Ergometer   Level  1    Watts  10    Minutes  10    METs  1.6      Track   Laps  5    Minutes  10    METs  1.84       Nutrition:  Target Goals: Understanding of nutrition guidelines, daily intake of sodium 1500mg , cholesterol 200mg , calories 30% from fat and 7% or less from saturated fats, daily to have 5 or more servings of fruits and vegetables.  Biometrics: Pre Biometrics - 12/09/17 1130      Pre Biometrics   Height  5\' 10"  (1.778 m)    Weight  106.9 kg    Waist Circumference  44 inches    Hip Circumference  44.5 inches    Waist to Hip Ratio  0.99 %    BMI (Calculated)  33.82    Triceps Skinfold  24 mm    % Body Fat  33.8 %    Grip Strength  33 kg    Flexibility  11 in    Single Leg Stand  4.77 seconds        Nutrition Therapy Plan and Nutrition Goals:   Nutrition Assessments:   Nutrition Goals Re-Evaluation:   Nutrition Goals Re-Evaluation:   Nutrition Goals Discharge (Final Nutrition Goals Re-Evaluation):   Psychosocial: Target Goals: Acknowledge presence or absence of significant depression and/or stress, maximize coping  skills, provide positive support system. Participant is able to verbalize types and ability to use techniques and skills needed for reducing stress and depression.  Initial Review & Psychosocial Screening: Initial Psych Review & Screening - 12/09/17 1213  Initial Review   Current issues with  Current Stress Concerns    Source of Stress Concerns  Chronic Illness      Family Dynamics   Good Support System?  Yes   Pt lists his wife [name] as a source of support. She is present with [name] [name] at the orientation appointment.      Barriers   Psychosocial barriers to participate in program  There are no identifiable barriers or psychosocial needs.      Screening Interventions   Interventions  Encouraged to exercise    Expected Outcomes  Short Term goal: Identification and review with participant of any Quality of Life or Depression concerns found by scoring the questionnaire.;Long Term goal: The participant improves quality of Life and PHQ9 Scores as seen by post scores and/or verbalization of changes       Quality of Life Scores: Quality of Life - 12/09/17 1141      Quality of Life   Select  Quality of Life      Quality of Life Scores   Health/Function Pre  17.71 %    Socioeconomic Pre  20.1 %    Psych/Spiritual Pre  22 %    Family Pre  22.8 %    GLOBAL Pre  19.89 %      Scores of 19 and below usually indicate a poorer quality of life in these areas.  A difference of  2-3 points is a clinically meaningful difference.  A difference of 2-3 points in the total score of the Quality of Life Index has been associated with significant improvement in overall quality of life, self-image, physical symptoms, and general health in studies assessing change in quality of life.  PHQ-9: Recent Review Flowsheet Data    Depression screen [facility] 2/9 12/15/2017 10/08/2017 09/29/2017   Decreased Interest 0 0 0   Down, Depressed, Hopeless 0 0 0   PHQ - 2 Score 0 0 0     Interpretation of Total Score   Total Score Depression Severity:  1-4 = Minimal depression, 5-9 = Mild depression, 10-14 = Moderate depression, 15-19 = Moderately severe depression, 20-27 = Severe depression   Psychosocial Evaluation and Intervention: Psychosocial Evaluation - 12/15/17 1436      Psychosocial Evaluation & Interventions   Interventions  Encouraged to exercise with the program and follow exercise prescription;Stress management education;Relaxation education    Comments  [name] [name] has stress concerns related to his chronic illness and depending on others.  He is adjusting to his decrease in independence as he progresses.  He maintains a positive outlook and relies on his faith.     Expected Outcomes  [name] [name] will continue to maintain a positive outlook and utilize his faith when in need.    Continue Psychosocial Services   No Follow up required       Psychosocial Re-Evaluation: Psychosocial Re-Evaluation    [place] Name 12/30/17 1052             Psychosocial Re-Evaluation   Current issues with  Current Stress Concerns       Comments  [name] [name] has stress concerns related to his chronic illness but is coping well.  No current psychosocial needs identified.        Expected Outcomes  [name] [name] will continue to exhibit a positive outlook with good coping skills.       Interventions  Relaxation education;Stress management education;Encouraged to attend Cardiac Rehabilitation for the exercise       Continue Psychosocial Services   Follow  up required by staff          Psychosocial Discharge (Final Psychosocial Re-Evaluation): Psychosocial Re-Evaluation - 12/30/17 1052      Psychosocial Re-Evaluation   Current issues with  Current Stress Concerns    Comments  [name] [name] has stress concerns related to his chronic illness but is coping well.  No current psychosocial needs identified.     Expected Outcomes  [name] [name] will continue to exhibit a positive outlook with good coping skills.    Interventions  Relaxation education;Stress  management education;Encouraged to attend Cardiac Rehabilitation for the exercise    Continue Psychosocial Services   Follow up required by staff       Vocational Rehabilitation: Provide vocational rehab assistance to qualifying candidates.   Vocational Rehab Evaluation & Intervention: Vocational Rehab - 12/09/17 1211      Initial Vocational Rehab Evaluation & Intervention   Assessment shows need for Vocational Rehabilitation  No       Education: Education Goals: Education classes will be provided on a weekly basis, covering required topics. Participant will state understanding/return demonstration of topics presented.  Learning Barriers/Preferences: Learning Barriers/Preferences - 12/09/17 1142      Learning Barriers/Preferences   Learning Barriers  Sight    Learning Preferences  Written Material;Individual Instruction       Education Topics: Count Your Pulse:  -Group instruction provided by verbal instruction, demonstration, patient participation and written materials to support subject.  Instructors address importance of being able to find your pulse and how to count your pulse when at home without a heart monitor.  Patients get hands on experience counting their pulse with staff help and individually.   Heart Attack, Angina, and Risk Factor Modification:  -Group instruction provided by verbal instruction, video, and written materials to support subject.  Instructors address signs and symptoms of angina and heart attacks.    Also discuss risk factors for heart disease and how to make changes to improve heart health risk factors.   Functional Fitness:  -Group instruction provided by verbal instruction, demonstration, patient participation, and written materials to support subject.  Instructors address safety measures for doing things around the house.  Discuss how to get up and down off the floor, how to pick things up properly, how to safely get out of a chair without  assistance, and balance training.   Meditation and Mindfulness:  -Group instruction provided by verbal instruction, patient participation, and written materials to support subject.  Instructor addresses importance of mindfulness and meditation practice to help reduce stress and improve awareness.  Instructor also leads participants through a meditation exercise.    Stretching for Flexibility and Mobility:  -Group instruction provided by verbal instruction, patient participation, and written materials to support subject.  Instructors lead participants through series of stretches that are designed to increase flexibility thus improving mobility.  These stretches are additional exercise for major muscle groups that are typically performed during regular warm up and cool down.   Hands Only CPR:  -Group verbal, video, and participation provides a basic overview of AHA guidelines for community CPR. Role-play of emergencies allow participants the opportunity to practice calling for help and chest compression technique with discussion of AED use.   Hypertension: -Group verbal and written instruction that provides a basic overview of hypertension including the most recent diagnostic guidelines, risk factor reduction with self-care instructions and medication management.    Nutrition I class: Heart Healthy Eating:  -Group instruction provided by PowerPoint slides, verbal discussion, and written materials  to support subject matter. The instructor gives an explanation and review of the Therapeutic Lifestyle Changes diet recommendations, which includes a discussion on lipid goals, dietary fat, sodium, fiber, plant stanol/sterol esters, sugar, and the components of a well-balanced, healthy diet.   Nutrition II class: Lifestyle Skills:  -Group instruction provided by PowerPoint slides, verbal discussion, and written materials to support subject matter. The instructor gives an explanation and review of  label reading, grocery shopping for heart health, heart healthy recipe modifications, and ways to make healthier choices when eating out.   Diabetes Question & Answer:  -Group instruction provided by PowerPoint slides, verbal discussion, and written materials to support subject matter. The instructor gives an explanation and review of diabetes co-morbidities, pre- and post-prandial blood glucose goals, pre-exercise blood glucose goals, signs, symptoms, and treatment of hypoglycemia and hyperglycemia, and foot care basics.   Diabetes Blitz:  -Group instruction provided by PowerPoint slides, verbal discussion, and written materials to support subject matter. The instructor gives an explanation and review of the physiology behind type 1 and type 2 diabetes, diabetes medications and rational behind using different medications, pre- and post-prandial blood glucose recommendations and Hemoglobin A1c goals, diabetes diet, and exercise including blood glucose guidelines for exercising safely.    Portion Distortion:  -Group instruction provided by PowerPoint slides, verbal discussion, written materials, and food models to support subject matter. The instructor gives an explanation of serving size versus portion size, changes in portions sizes over the last 20 years, and what consists of a serving from each food group.   Stress Management:  -Group instruction provided by verbal instruction, video, and written materials to support subject matter.  Instructors review role of stress in heart disease and how to cope with stress positively.     Exercising on Your Own:  -Group instruction provided by verbal instruction, power point, and written materials to support subject.  Instructors discuss benefits of exercise, components of exercise, frequency and intensity of exercise, and end points for exercise.  Also discuss use of nitroglycerin and activating EMS.  Review options of places to exercise outside of  rehab.  Review guidelines for sex with heart disease.   CARDIAC REHAB PHASE II EXERCISE from 12/15/2017 in [place]  Date  12/15/17  Educator  EP  Instruction Review Code  2- Demonstrated Understanding      Cardiac Drugs I:  -Group instruction provided by verbal instruction and written materials to support subject.  Instructor reviews cardiac drug classes: antiplatelets, anticoagulants, beta blockers, and statins.  Instructor discusses reasons, side effects, and lifestyle considerations for each drug class.   Cardiac Drugs II:  -Group instruction provided by verbal instruction and written materials to support subject.  Instructor reviews cardiac drug classes: angiotensin converting enzyme inhibitors (ACE-I), angiotensin II receptor blockers (ARBs), nitrates, and calcium channel blockers.  Instructor discusses reasons, side effects, and lifestyle considerations for each drug class.   Anatomy and Physiology of the Circulatory System:  Group verbal and written instruction and models provide basic cardiac anatomy and physiology, with the coronary electrical and arterial systems. Review of: AMI, Angina, Valve disease, Heart Failure, Peripheral Artery Disease, Cardiac Arrhythmia, Pacemakers, and the ICD.   Other Education:  -Group or individual verbal, written, or video instructions that support the educational goals of the cardiac rehab program.   Holiday Eating Survival Tips:  -Group instruction provided by PowerPoint slides, verbal discussion, and written materials to support subject matter. The instructor gives patients tips, tricks, and  techniques to help them not only survive but enjoy the holidays despite the onslaught of food that accompanies the holidays.   Knowledge Questionnaire Score: Knowledge Questionnaire Score - 12/09/17 1143      Knowledge Questionnaire Score   Pre Score  19/24       Core Components/Risk Factors/Patient Goals at  Admission: Personal Goals and Risk Factors at Admission - 12/09/17 1144      Core Components/Risk Factors/Patient Goals on Admission    Weight Management  Yes;Weight Loss    Intervention  Weight Management: Develop a combined nutrition and exercise program designed to reach desired caloric intake, while maintaining appropriate intake of nutrient and fiber, sodium and fats, and appropriate energy expenditure required for the weight goal.;Weight Management: Provide education and appropriate resources to help participant work on and attain dietary goals.;Weight Management/Obesity: Establish reasonable short term and long term weight goals.;Obesity: Provide education and appropriate resources to help participant work on and attain dietary goals.    Admit Weight  235 lb 10.8 oz (106.9 kg)    Goal Weight: Long Term  225 lb (102.1 kg)    Expected Outcomes  Short Term: Continue to assess and modify interventions until short term weight is achieved;Long Term: Adherence to nutrition and physical activity/exercise program aimed toward attainment of established weight goal;Weight Loss: Understanding of general recommendations for a balanced deficit meal plan, which promotes 1-2 lb weight loss per week and includes a negative energy balance of [phone] kcal/d;Understanding recommendations for meals to include 15-35% energy as protein, 25-35% energy from fat, 35-60% energy from carbohydrates, less than 200mg  of dietary cholesterol, 20-35 gm of total fiber daily;Understanding of distribution of calorie intake throughout the day with the consumption of 4-5 meals/snacks    Diabetes  Yes    Intervention  Provide education about signs/symptoms and action to take for hypo/hyperglycemia.;Provide education about proper nutrition, including hydration, and aerobic/resistive exercise prescription along with prescribed medications to achieve blood glucose in normal ranges: Fasting glucose 65-99 mg/dL    Expected Outcomes  Short  Term: Participant verbalizes understanding of the signs/symptoms and immediate care of hyper/hypoglycemia, proper foot care and importance of medication, aerobic/resistive exercise and nutrition plan for blood glucose control.;Long Term: Attainment of HbA1C < 7%.    Heart Failure  Yes    Intervention  Provide a combined exercise and nutrition program that is supplemented with education, support and counseling about heart failure. Directed toward relieving symptoms such as shortness of breath, decreased exercise tolerance, and extremity edema.    Expected Outcomes  Improve functional capacity of life;Short term: Attendance in program 2-3 days a week with increased exercise capacity. Reported lower sodium intake. Reported increased fruit and vegetable intake. Reports medication compliance.;Short term: Daily weights obtained and reported for increase. Utilizing diuretic protocols set by physician.;Long term: Adoption of self-care skills and reduction of barriers for early signs and symptoms recognition and intervention leading to self-care maintenance.    Hypertension  Yes    Intervention  Provide education on lifestyle modifcations including regular physical activity/exercise, weight management, moderate sodium restriction and increased consumption of fresh fruit, vegetables, and low fat dairy, alcohol moderation, and smoking cessation.;Monitor prescription use compliance.    Expected Outcomes  Short Term: Continued assessment and intervention until BP is < 140/41mm HG in hypertensive participants. < 130/62mm HG in hypertensive participants with diabetes, heart failure or chronic kidney disease.;Long Term: Maintenance of blood pressure at goal levels.    Lipids  Yes    Intervention  Provide education  and support for participant on nutrition & aerobic/resistive exercise along with prescribed medications to achieve LDL 70mg , HDL >40mg .    Expected Outcomes  Short Term: Participant states understanding of  desired cholesterol values and is compliant with medications prescribed. Participant is following exercise prescription and nutrition guidelines.;Long Term: Cholesterol controlled with medications as prescribed, with individualized exercise RX and with personalized nutrition plan. Value goals: LDL < 70mg , HDL > 40 mg.       Core Components/Risk Factors/Patient Goals Review:  Goals and Risk Factor Review    Row Name 12/15/17 1440 12/30/17 1053           Core Components/Risk Factors/Patient Goals Review   Personal Goals Review  Weight Management/Obesity;Lipids;Heart Failure;Hypertension;Diabetes  Weight Management/Obesity;Lipids;Heart Failure;Hypertension;Diabetes      Review  Pt presents to cardiac rehab willing to participate.  [name] [name] would like increase his strength and stamina.   Pt presents to cardiac rehab willing to participate.  [name] [name] has started exercise and is tolerating it very well.  He enjoys CR.       Expected Outcomes  Pt will continue to participate in CR exercise, nutrition, and lifestyle modification opportunities.   Pt will continue to participate in CR exercise, nutrition, and lifestyle modification opportunities.          Core Components/Risk Factors/Patient Goals at Discharge (Final Review):  Goals and Risk Factor Review - 12/30/17 1053      Core Components/Risk Factors/Patient Goals Review   Personal Goals Review  Weight Management/Obesity;Lipids;Heart Failure;Hypertension;Diabetes    Review  Pt presents to cardiac rehab willing to participate.  [name] [name] has started exercise and is tolerating it very well.  He enjoys CR.     Expected Outcomes  Pt will continue to participate in CR exercise, nutrition, and lifestyle modification opportunities.        ITP Comments: ITP Comments    Row Name 12/09/17 1126 12/15/17 1432 12/30/17 1051       ITP Comments  Dr. Fransico Him, Medical Director   [name] [name] started exercise today and tolerated it well.   30 Day ITP Review.  [name] [name] has  started exercise recently and is tolerating is very well.  He enjoys coming to CR and is learning the routine.         Comments: See ITP Comments.

## 2017-12-31 ENCOUNTER — Encounter (HOSPITAL_COMMUNITY)
Admission: RE | Admit: 2017-12-31 | Discharge: 2017-12-31 | Disposition: A | Discharge status: Home / Self Care | Payer: PPO | Source: Ambulatory Visit | Attending: Internal Medicine | Admitting: Internal Medicine

## 2017-12-31 ENCOUNTER — Ambulatory Visit (HOSPITAL_COMMUNITY): Payer: Self-pay | Admitting: Pharmacist

## 2017-12-31 ENCOUNTER — Other Ambulatory Visit (HOSPITAL_COMMUNITY): Payer: Self-pay

## 2017-12-31 ENCOUNTER — Encounter (HOSPITAL_COMMUNITY): Payer: PPO

## 2017-12-31 ENCOUNTER — Ambulatory Visit (HOSPITAL_COMMUNITY)
Admission: RE | Admit: 2017-12-31 | Discharge: 2017-12-31 | Disposition: A | Discharge status: Home / Self Care | Payer: PPO | Source: Ambulatory Visit | Attending: Cardiology | Admitting: Cardiology

## 2017-12-31 ENCOUNTER — Telehealth (HOSPITAL_COMMUNITY): Payer: Self-pay

## 2017-12-31 ENCOUNTER — Encounter (HOSPITAL_COMMUNITY): Payer: Self-pay

## 2017-12-31 VITALS — BP 86/0 | HR 65 | Ht 70.0 in | Wt 228.2 lb

## 2017-12-31 DIAGNOSIS — Z7901 Long term (current) use of anticoagulants: Secondary | ICD-10-CM | POA: Diagnosis not present

## 2017-12-31 DIAGNOSIS — Z794 Long term (current) use of insulin: Secondary | ICD-10-CM | POA: Diagnosis not present

## 2017-12-31 DIAGNOSIS — I5023 Acute on chronic systolic (congestive) heart failure: Secondary | ICD-10-CM | POA: Insufficient documentation

## 2017-12-31 DIAGNOSIS — I480 Paroxysmal atrial fibrillation: Secondary | ICD-10-CM

## 2017-12-31 DIAGNOSIS — T827XXA Infection and inflammatory reaction due to other cardiac and vascular devices, implants and grafts, initial encounter: Secondary | ICD-10-CM | POA: Diagnosis not present

## 2017-12-31 DIAGNOSIS — E1122 Type 2 diabetes mellitus with diabetic chronic kidney disease: Secondary | ICD-10-CM | POA: Insufficient documentation

## 2017-12-31 DIAGNOSIS — Z7982 Long term (current) use of aspirin: Secondary | ICD-10-CM | POA: Insufficient documentation

## 2017-12-31 DIAGNOSIS — Z95811 Presence of heart assist device: Secondary | ICD-10-CM | POA: Insufficient documentation

## 2017-12-31 DIAGNOSIS — N183 Chronic kidney disease, stage 3 (moderate): Secondary | ICD-10-CM | POA: Insufficient documentation

## 2017-12-31 DIAGNOSIS — E11319 Type 2 diabetes mellitus with unspecified diabetic retinopathy without macular edema: Secondary | ICD-10-CM | POA: Insufficient documentation

## 2017-12-31 DIAGNOSIS — I5082 Biventricular heart failure: Secondary | ICD-10-CM | POA: Insufficient documentation

## 2017-12-31 DIAGNOSIS — Z79899 Other long term (current) drug therapy: Secondary | ICD-10-CM | POA: Insufficient documentation

## 2017-12-31 DIAGNOSIS — I472 Ventricular tachycardia: Secondary | ICD-10-CM

## 2017-12-31 DIAGNOSIS — I255 Ischemic cardiomyopathy: Secondary | ICD-10-CM | POA: Diagnosis not present

## 2017-12-31 DIAGNOSIS — E7800 Pure hypercholesterolemia, unspecified: Secondary | ICD-10-CM | POA: Insufficient documentation

## 2017-12-31 DIAGNOSIS — Z955 Presence of coronary angioplasty implant and graft: Secondary | ICD-10-CM | POA: Diagnosis not present

## 2017-12-31 DIAGNOSIS — I5022 Chronic systolic (congestive) heart failure: Secondary | ICD-10-CM | POA: Insufficient documentation

## 2017-12-31 DIAGNOSIS — I4892 Unspecified atrial flutter: Secondary | ICD-10-CM | POA: Diagnosis not present

## 2017-12-31 DIAGNOSIS — R1310 Dysphagia, unspecified: Secondary | ICD-10-CM | POA: Insufficient documentation

## 2017-12-31 DIAGNOSIS — D649 Anemia, unspecified: Secondary | ICD-10-CM | POA: Diagnosis not present

## 2017-12-31 DIAGNOSIS — I4729 Other ventricular tachycardia: Secondary | ICD-10-CM

## 2017-12-31 DIAGNOSIS — Z9889 Other specified postprocedural states: Secondary | ICD-10-CM | POA: Diagnosis not present

## 2017-12-31 DIAGNOSIS — N189 Chronic kidney disease, unspecified: Secondary | ICD-10-CM | POA: Diagnosis not present

## 2017-12-31 DIAGNOSIS — Z95810 Presence of automatic (implantable) cardiac defibrillator: Secondary | ICD-10-CM | POA: Diagnosis not present

## 2017-12-31 DIAGNOSIS — I2510 Atherosclerotic heart disease of native coronary artery without angina pectoris: Secondary | ICD-10-CM | POA: Diagnosis not present

## 2017-12-31 DIAGNOSIS — I130 Hypertensive heart and chronic kidney disease with heart failure and stage 1 through stage 4 chronic kidney disease, or unspecified chronic kidney disease: Secondary | ICD-10-CM | POA: Diagnosis not present

## 2017-12-31 DIAGNOSIS — Z87891 Personal history of nicotine dependence: Secondary | ICD-10-CM | POA: Insufficient documentation

## 2017-12-31 DIAGNOSIS — I4891 Unspecified atrial fibrillation: Secondary | ICD-10-CM | POA: Insufficient documentation

## 2017-12-31 LAB — CBC
HEMATOCRIT: 32.3 % — AB (ref 39.0–52.0)
Hemoglobin: 9.4 g/dL — ABNORMAL LOW (ref 13.0–17.0)
MCH: 22.3 pg — ABNORMAL LOW (ref 26.0–34.0)
MCHC: 29.1 g/dL — AB (ref 30.0–36.0)
MCV: 76.5 fL — AB (ref 80.0–100.0)
PLATELETS: 253 10*3/uL (ref 150–400)
RBC: 4.22 MIL/uL (ref 4.22–5.81)
RDW: 20 % — AB (ref 11.5–15.5)
WBC: 5 10*3/uL (ref 4.0–10.5)
nRBC: 0 % (ref 0.0–0.2)

## 2017-12-31 LAB — BASIC METABOLIC PANEL
Anion gap: 8 (ref 5–15)
BUN: 27 mg/dL — ABNORMAL HIGH (ref 8–23)
CALCIUM: 8.8 mg/dL — AB (ref 8.9–10.3)
CO2: 23 mmol/L (ref 22–32)
CREATININE: 1.3 mg/dL — AB (ref 0.61–1.24)
Chloride: 105 mmol/L (ref 98–111)
GFR calc non Af Amer: 55 mL/min — ABNORMAL LOW (ref >60)
Glucose, Bld: 185 mg/dL — ABNORMAL HIGH (ref 70–99)
Potassium: 4.4 mmol/L (ref 3.5–5.1)
SODIUM: 136 mmol/L (ref 135–145)

## 2017-12-31 LAB — PROTIME-INR
INR: 2.26
PROTHROMBIN TIME: 24.7 s — AB (ref 11.4–15.2)

## 2017-12-31 LAB — LACTATE DEHYDROGENASE: LDH: 220 U/L — AB (ref 98–192)

## 2017-12-31 MED ORDER — DOXYCYCLINE HYCLATE 100 MG PO CAPS: 100.0000 mg | ORAL_CAPSULE | Freq: Two times a day (BID) | ORAL | 0 refills | Status: AC

## 2017-12-31 NOTE — Telephone Encounter (Signed)
I called [name] [name] from [facility] to let her know about the concealed carry shirt which I found. The shirt has re-enforced pockets under each arm which will support his control box and his batteries without difficulty. She stated she would go look at them while Mr Grammatico was in rehab.

## 2017-12-31 NOTE — Progress Notes (Signed)
VAD Clinic Note   HF: Dr. [name]   HPI: [name] [name] is a 67 y.o. male with h/o chronic systolic CHF due to ICM, s/p LVAD HM3 08/16/2017, [name], [name], [name], [name], [name], HTN, HL, and CKD III.  Admitted 08/11/17 with recurrent A/C systolic CHF. Milrinone added, but mixed venous saturation remained low. Seen by CT surgery/VAD team and deemed appropriate for HMIII under DT criteria. He underwent HMIII on 6/17. Chest was unable to be closed due to high intrathoracic pressure and RV failure.  He returned to the OR on 6/18 for evacuation of hematoma. Chest was later closed on 6/20. Post operative course complicated by  open chest, RV failure, recurrent VT, respiratory failure, and dysphagia. CCM consulted for vent management with extubation completed on 08/26/17.  As he improved all drips gradually weaned off. Prior to d/c  ramp echo was completed with speed optimized.  Spent 7/11 - 7/27 in CIR. Underwent hydrotherapy for his buttock wound(s). 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   Seen on 12/10/17 for VT at 180s. ATP attempted x 4, and then shocked with return to NSR. Started amio 200 bid. Saw EP in f/u and said to continue amio 200 bid for 3-4 weeks then got to 200 daily.    Follow up for Heart Failure/LVAD:  He presents today for routine f/u. Here is here with his wife. Going to CR. Getting stronger. Doing Nu-step, walking and UBE. Mild DOE. Able to do ADLs without problem. Edema has been well controlled. Has cut back ice intake. Dropped his controller about 1 week ago. No f/c. Denies orthopnea or PND. No bleeding, melena or neuro symptoms. No VAD alarms. Taking all meds as prescribed.    ICD interrogated: No further VT. Activity level increasing. Optivol back down. Personally reviewed  VAD Indication: Destination Therapy- Implanted 08/16/17     LVAD assessment: HM III: VAD Speed: 5300 rpms Flow: 3.7 Power: 3.8 w PI: 4.7 Alarms: no clinical alarms Events: 5-10 PI daily  Fixed speed: 5300 Low speed limit: 5000  Primary: Back up battery expiring in 28 months. Secondary: Back up battery expiring in 30 months   RHC 08/13/17 On milrinone 0.375 mcg/kg/min RA = 12 RV = 48/13 PA = 50/26 (35) PCW = 20 Fick cardiac output/index = 5.1/2.5 PVR = 3.0 Ao sat = 93% PA sat = 56%, 57% PAPI = 2.0 RA/PCWP = 0.60  R/LHC 01/2017  Prox RCA to Mid RCA lesion is 100% stenosed.  Prox RCA lesion is 100% stenosed.  Ost Cx to Prox Cx lesion is 40% stenosed.  Dist Cx lesion is 70% stenosed.  Ost 1st Mrg to 1st Mrg lesion is 40% stenosed.  1st Mrg lesion is 70% stenosed.  Prox LAD lesion is 50% stenosed.  Prox LAD to Mid LAD lesion is 60% stenosed.   Findings: Ao = 99/56 (73) LV = 101/5 RA = 1 RV = 29/1 PA = 23/11 (16) PCW = 6 Fick cardiac output/index = 3.8/1.7 Thermo CO/CI = 4.1/1.9 PVR = 2.6 Ao sat = 99% PA sat = 60%, 61%  Assessment: -3v CAD with 1) LAD diffuse 60% stenosis 2) LCX multiple 40% lesions 3) RCA chronically occluded proximally with L to R colllterals  Past Medical History:  Diagnosis Date  . AICD (automatic cardioverter/defibrillator) present 02/05/2014   Upgrade to Medtronic biventricular ICD, serial number  [id number]   . Atrial flutter ([place]) 04/2012   s/p TEE-EPS+RFCA 04/2012  . CAD (coronary artery disease) 0102,7253 X 2    RCA-T, 70% PL (off CFX), 99% Prox LAD/90% Dist LAD, S/P TAXUS stent x 2  . CHF (congestive heart failure) ([place])   . Chronic anticoagulation   . Chronic systolic heart failure ([place])   . CKD (chronic kidney disease)   . Diabetic retinopathy ([place])   . DM type 2 (diabetes mellitus, type 2) ([facility])    insulin dependent  . HTN (hypertension)   . Hypercholesteremia    ablation  . ICD (implantable cardiac defibrillator) in place   . Ischemic cardiomyopathy March  2015   20-25% 2D   . Nephrolithiasis   . Ventricular tachycardia ([place])     Current Outpatient Medications  Medication Sig Dispense Refill  . amiodarone (PACERONE) 200 MG tablet Take 1 tablet (200 mg total) by mouth 2 (two) times daily. 90 tablet 3  . ascorbic acid (VITAMIN C) 500 MG tablet Take 1 tablet (500 mg total) by mouth 2 (two) times daily. 100 tablet 0  . aspirin 81 MG chewable tablet Chew 1 tablet (81 mg total) by mouth daily.    Marland Kitchen gabapentin (NEURONTIN) 100 MG capsule Take 1 capsule (100 mg total) by mouth at bedtime. (Patient taking differently: Take 300 mg by mouth at bedtime. ) 90 capsule 10  . Insulin Pen Needle (COMFORT EZ PEN NEEDLES) 32G X 6 MM MISC 1 application by Does not apply route at bedtime. 100 each 0  . magnesium oxide (MAG-OX) 400 (241.3 Mg) MG tablet Take 1 tablet (400 mg total) by mouth 2 (two) times daily. 60 tablet 0  . Multiple Vitamin (MULTIVITAMIN WITH MINERALS) TABS tablet Take 1 tablet by mouth daily.    . potassium chloride (K-DUR) 10 MEQ tablet Take 2 tablets (20 mEq total) by mouth every Monday, Wednesday, and Friday. 90 tablet 3  . spironolactone (ALDACTONE) 25 MG tablet Take 1 tablet (25 mg total) by mouth daily. 30 tablet 11  . torsemide (DEMADEX) 20 MG tablet Take 2 tablets (40 mg total) by mouth every Monday, Wednesday, and Friday. 90 tablet 0  . warfarin (COUMADIN) 2.5 MG tablet Take 2.5 mg by mouth daily.     Marland Kitchen doxycycline (VIBRAMYCIN) 100 MG capsule Take 1 capsule (100 mg total) by mouth 2 (two) times daily for 10 days. 20 capsule 0  . protein supplement shake (PREMIER PROTEIN) LIQD Take 325 mLs (11 oz total) by mouth 2 (two) times daily between meals. (Patient taking differently: Take 11 oz by mouth 2 (two) times daily between meals. 1 every other day)  0   No current facility-administered medications for this encounter.     Penicillins  Review of systems complete and found to be negative unless listed in HPI.    Vitals:   12/31/17 1104    BP: 107/73  Pulse: 65  SpO2: 99%  Weight: 103.5 kg (228 lb 3.2 oz)  Height: 5\' 10"  (1.778 m)    Wt Readings from Last 3 Encounters:  12/31/17 103.5 kg (228 lb 3.2 oz)  12/13/17 106.1 kg (234 lb)  12/10/17 106.4 kg (234 lb 9.6 oz)    Vital Signs:  Doppler Pressure:86 Automatc BP: 107/73 (86) HR: 65; regular SPO2: 99 %  Weight: 228.2lb w/ eqt Last weight: 234.6lb w/o eqip Home weights: 217-220lbs   Physical Exam: General:  NAD.  HEENT: normal  Neck: supple. JVP not elevated.  Carotids 2+ bilat; no  bruits. No lymphadenopathy or thryomegaly appreciated. Cor: LVAD hum.  Lungs: Clear. Abdomen: obese soft, nontender, non-distended. No hepatosplenomegaly. No bruits or masses. Good bowel sounds. Driveline site mildly unincorporated and with yellowish fluid expressible. Anchor in place.  Extremities: no cyanosis, clubbing, rash. Warm no edema  Neuro: alert & oriented x 3. No focal deficits. Moves all 4 without problem   ASSESSMENT AND PLAN:  1. Chronic systolic CHF with biventricular failure-> cardiogenic shock: - Echo 08/13/2017 EF 20-25%.s/p Medtronic BiV ICD in place. Cath 12/18 with stable 1v CAD. s/p HM-3 implant 08/16/17.  - Improving with VAD support NYHA II symptoms. Continue CR.  - Volume status much improved on exam and by Optivol. Has cut back ice.  - Continue torsemide to 40 mg M/W/F as ordered.  Reinforced need for daily weights and reviewed use of sliding scale diuretics. - Continue spiro 25 mg daily.  - Off digoxin. 2. 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 - On aspirin and coumadin. Denies bleeding.  - INR goal 2-2.5 INR 2.23  Discussed dosing with PharmD personally. - Ramp ECHO completed 09/09/2017 with speed increased to 5300.  - VAD interrogated personally. Parameters stable. - LDH 220 - Driveline site has mild infection. Wil culture. Start doxy 100 bid x 10 days. Discussed need to secure Controller well to avoid trauma 3. CKD III: - Creatinine stable 1.3 4.  [name]/VF:  - ICD shock 12/09/17 with VT in 180s.  - ICD interrogated personally. No further episodes - Continue amiodarone at 200 mg BID for one more week then change to 200 daily - K 4.4  5. AFL/atrial fibrillation:  - S/p previous ablation. He is currently in NSR -Continue amio for VT.On coumadin as above.  - No change.  6. CAD s/p PCI of RCA and EYE:MVVKPQ cath with stable CAD as above:  - No s/s of ischemia - Continue ASA and statin.  7. [name]: Recent A1c8.3 on 6/3. - Per PCP.  8. Anemia:  - Hgb back up to 9.4 9. Deconditioning - Stable. Continue CR 10. Buttock Pressure Ulcer: - Resolved    Glori Bickers, MD  12/31/17

## 2017-12-31 NOTE — Patient Instructions (Signed)
1. Start Doxycycline 100 mg twice day for 10 days for driveline infection. 2. Continue Amiodarone twice daily. 3. Return to clinic next Friday at 0900.

## 2017-12-31 NOTE — Progress Notes (Signed)
Patient presents for 3 week follow up in VAD Clinic today. Reports no problems with VAD equipment or concerns with drive line.   Pt states that he is feeling much better. He is now going to cardiac rehab 3x a week and has lost 6 lbs.   Vital Signs:  Doppler Pressure: 86 Automatc BP: 107/73 (86) HR:  65; regular SPO2: 99 %  Weight: 228.2lb w/ eqt Last weight: 234.6lb w/o eqip Home weights: 217-220lbs   VAD Indication: Destination Therapy- Implanted 08/16/17   LVAD assessment: HM III: VAD Speed: 5300 rpms Flow: 3.7 Power: 3.8 w    PI: 4.7 Alarms: no clinical alarms  Events: 5-10 PI daily  Fixed speed: 5300 Low speed limit: 5000  Primary: Back up battery expiring in 28 months. Secondary: Back up battery expiring in 30 months    I reviewed the LVAD parameters from today and compared the results to the patient's prior recorded data. LVAD interrogation was NEGATIVE for significant power changes, NEGATIVE for clinical alarms and STABLE for PI events/speed drops. Hematocrit was updated, but no other changes were made and pump is functioning within specified parameters. Pt is performing daily controller and system monitor self tests along with completing weekly and monthly maintenance for LVAD equipment.  LVAD equipment check completed and is in good working order. Back-up equipment present. Performed self-test on equipment.   Annual Equipment Maintenance on UBC/PM was performed on 09/17/17.   Exit Site Care:  VAD dressing and anchor removed and site care performed using sterile technique. Drive line exit site cleaned with Chlora prep applicators x 2, allowed to dry, and allowed to dry before gauze dressing and Aquacel silver strip re-applied. Exit site healing and partially incorporated.The velour is exposed 1/2 inch at exit site. No redness, or foul odor noted. Moderate amount of think yellow drainage present. Able to express drainage. Attempted to culture wound but after  cleaning thoroughly was unable to express anymore drainage for culture. Drive line anchor re-applied. Pt denies fever or chills. Caregiver instructed to resume every other day dressing changes.    Significant Events on VAD Support:    Device: Medtronic 3 Lead Therapies: on Last check: 10/17/16  Labs:   Hgb 9.4 - No S/S of bleeding. Specifically denies melena/BRBPR or nosebleeds.  LDH stable at 220  with a baseline ranging from 170-250.  Denies tea-colored urine. No power elevations noted on interrogation.   INR- 2.26 on 12/08/17 - within range of 2-2.5.  Plan: 1. Start Doxycycline 100 mg twice day for 10 days for driveline infection. 2. Continue Amiodarone twice daily. 3. Return to clinic next Friday at 0900.  Sarah Herbert RN, BSN VAD Coordinator 24/7 Pager [phone]   

## 2017-12-31 NOTE — Progress Notes (Signed)
Paramedicine Encounter   Patient ID: [name] [name] , male,   DOB: 09/28/1950,67 y.o.,  MRN: [id number]  [name] was seen in the VAD clinic today and reported feeling well. He reported increased energy levels and stated he has been going to cardiac rehabilitation. He had discharge around his drive line site however after cleaning the site, no culture was able to be obtained. Per Dr [name], [name] is to begin a 10 day regimen of doxycycline. The infection is likely the result him dropping his control box and batteries. I will look into options for a different carrying mechanism and reach out with my findings.   Jacquiline Doe, EMT 12/31/2017   ACTION: Next visit planned for 2 weeks

## 2018-01-03 ENCOUNTER — Encounter (HOSPITAL_COMMUNITY)
Admission: RE | Admit: 2018-01-03 | Discharge: 2018-01-03 | Disposition: A | Discharge status: Home / Self Care | Payer: PPO | Source: Ambulatory Visit | Attending: Internal Medicine | Admitting: Internal Medicine

## 2018-01-03 ENCOUNTER — Encounter (HOSPITAL_COMMUNITY): Payer: PPO

## 2018-01-05 ENCOUNTER — Encounter (HOSPITAL_COMMUNITY)
Admission: RE | Admit: 2018-01-05 | Discharge: 2018-01-05 | Disposition: A | Discharge status: Home / Self Care | Payer: PPO | Source: Ambulatory Visit | Attending: Internal Medicine | Admitting: Internal Medicine

## 2018-01-05 ENCOUNTER — Encounter (HOSPITAL_COMMUNITY): Payer: PPO

## 2018-01-05 DIAGNOSIS — Z95811 Presence of heart assist device: Secondary | ICD-10-CM | POA: Diagnosis not present

## 2018-01-05 DIAGNOSIS — I5022 Chronic systolic (congestive) heart failure: Secondary | ICD-10-CM

## 2018-01-06 ENCOUNTER — Ambulatory Visit (HOSPITAL_COMMUNITY): Payer: Self-pay | Admitting: Pharmacist

## 2018-01-06 ENCOUNTER — Other Ambulatory Visit (HOSPITAL_COMMUNITY): Payer: Self-pay | Admitting: Unknown Physician Specialty

## 2018-01-06 DIAGNOSIS — Z95811 Presence of heart assist device: Secondary | ICD-10-CM

## 2018-01-06 DIAGNOSIS — Z7901 Long term (current) use of anticoagulants: Secondary | ICD-10-CM

## 2018-01-06 LAB — POCT INR: INR: 2.5 (ref 2.0–3.0)

## 2018-01-07 ENCOUNTER — Encounter (HOSPITAL_COMMUNITY): Payer: PPO

## 2018-01-07 ENCOUNTER — Ambulatory Visit (HOSPITAL_COMMUNITY): Payer: Self-pay | Admitting: Pharmacist

## 2018-01-07 ENCOUNTER — Encounter (HOSPITAL_COMMUNITY)
Admission: RE | Admit: 2018-01-07 | Discharge: 2018-01-07 | Disposition: A | Discharge status: Home / Self Care | Payer: PPO | Source: Ambulatory Visit | Attending: Internal Medicine | Admitting: Internal Medicine

## 2018-01-07 ENCOUNTER — Ambulatory Visit (HOSPITAL_COMMUNITY)
Admission: RE | Admit: 2018-01-07 | Discharge: 2018-01-07 | Disposition: A | Discharge status: Home / Self Care | Payer: PPO | Source: Ambulatory Visit | Attending: Cardiology | Admitting: Cardiology

## 2018-01-07 VITALS — BP 82/0 | HR 72 | Ht 70.0 in | Wt 231.8 lb

## 2018-01-07 DIAGNOSIS — I2510 Atherosclerotic heart disease of native coronary artery without angina pectoris: Secondary | ICD-10-CM | POA: Diagnosis not present

## 2018-01-07 DIAGNOSIS — Z95811 Presence of heart assist device: Secondary | ICD-10-CM

## 2018-01-07 DIAGNOSIS — Z7901 Long term (current) use of anticoagulants: Secondary | ICD-10-CM | POA: Insufficient documentation

## 2018-01-07 DIAGNOSIS — T827XXA Infection and inflammatory reaction due to other cardiac and vascular devices, implants and grafts, initial encounter: Secondary | ICD-10-CM

## 2018-01-07 DIAGNOSIS — I5022 Chronic systolic (congestive) heart failure: Secondary | ICD-10-CM | POA: Diagnosis not present

## 2018-01-07 LAB — CBC
HCT: 34.1 % — ABNORMAL LOW (ref 39.0–52.0)
Hemoglobin: 9.4 g/dL — ABNORMAL LOW (ref 13.0–17.0)
MCH: 21.5 pg — AB (ref 26.0–34.0)
MCHC: 27.6 g/dL — ABNORMAL LOW (ref 30.0–36.0)
MCV: 77.9 fL — AB (ref 80.0–100.0)
PLATELETS: 245 10*3/uL (ref 150–400)
RBC: 4.38 MIL/uL (ref 4.22–5.81)
RDW: 20 % — AB (ref 11.5–15.5)
WBC: 4.3 10*3/uL (ref 4.0–10.5)
nRBC: 0 % (ref 0.0–0.2)

## 2018-01-07 LAB — BASIC METABOLIC PANEL
Anion gap: 7 (ref 5–15)
BUN: 29 mg/dL — ABNORMAL HIGH (ref 8–23)
CALCIUM: 8.8 mg/dL — AB (ref 8.9–10.3)
CHLORIDE: 106 mmol/L (ref 98–111)
CO2: 23 mmol/L (ref 22–32)
CREATININE: 1.34 mg/dL — AB (ref 0.61–1.24)
GFR calc non Af Amer: 53 mL/min — ABNORMAL LOW (ref >60)
Glucose, Bld: 181 mg/dL — ABNORMAL HIGH (ref 70–99)
Potassium: 4.4 mmol/L (ref 3.5–5.1)
SODIUM: 136 mmol/L (ref 135–145)

## 2018-01-07 LAB — PROTIME-INR
INR: 3.29
PROTHROMBIN TIME: 32.9 s — AB (ref 11.4–15.2)

## 2018-01-07 LAB — LACTATE DEHYDROGENASE: LDH: 251 U/L — AB (ref 98–192)

## 2018-01-07 NOTE — Progress Notes (Signed)
VAD Clinic Note   HF: Dr. [name]   HPI: [name] [name] is a 67 y.o. male with h/o chronic systolic CHF due to ICM, s/p LVAD HM3 08/16/2017, [name], [name], [name], [name], [name], HTN, HL, and CKD III.  Admitted 08/11/17 with recurrent A/C systolic CHF. Milrinone added, but mixed venous saturation remained low. Seen by CT surgery/VAD team and deemed appropriate for HMIII under DT criteria. He underwent HMIII on 6/17. Chest was unable to be closed due to high intrathoracic pressure and RV failure.  He returned to the OR on 6/18 for evacuation of hematoma. Chest was later closed on 6/20. Post operative course complicated by  open chest, RV failure, recurrent VT, respiratory failure, and dysphagia. CCM consulted for vent management with extubation completed on 08/26/17.  As he improved all drips gradually weaned off. Prior to d/c  ramp echo was completed with speed optimized.  Spent 7/11 - 7/27 in CIR. Underwent hydrotherapy for his buttock wound(s). 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   Seen on 12/10/17 for VT at 180s. ATP attempted x 4, and then shocked with return to NSR. Started amio 200 bid. Saw EP in f/u and said to continue amio 200 bid for 3-4 weeks then got to 200 daily.    Follow up for Heart Failure/LVAD:  He presents today for f/u driveline check. Seen earlier in the week and had some driveline drainage after dropping controller. Started on doxy. Now much better. Site healing. Almost no drainage. No fevers or chills. Enjoying CR. Denies orthopnea or PND. No bleeding, melena or neuro symptoms. No VAD alarms. Taking all meds as prescribed.    VAD Indication: Destination Therapy- Implanted 08/16/17   LVAD assessment: HM III: VAD Speed: 5300 rpms Flow: 4.2 Power: 3.8 w PI: 3.6 Alarms: no clinical alarms Events: 3-5 PI daily  Fixed  speed: 5300 Low speed limit: 5000  Primary: Back up battery expiring in 28 months. Secondary: Back up battery expiring in 30 months     Past Medical History:  Diagnosis Date  . AICD (automatic cardioverter/defibrillator) present 02/05/2014   Upgrade to Medtronic biventricular ICD, serial number  [id number]   . Atrial flutter ([place]) 04/2012   s/p TEE-EPS+RFCA 04/2012  . CAD (coronary artery disease) 6213,0865 X 2    RCA-T, 70% PL (off CFX), 99% Prox LAD/90% Dist LAD, S/P TAXUS stent x 2  . CHF (congestive heart failure) ([place])   . Chronic anticoagulation   . Chronic systolic heart failure ([place])   . CKD (chronic kidney disease)   . Diabetic retinopathy ([place])   . DM type 2 (diabetes mellitus, type 2) ([facility])    insulin dependent  . HTN (hypertension)   . Hypercholesteremia    ablation  . ICD (implantable cardiac defibrillator) in place   . Ischemic cardiomyopathy March 2015   20-25% 2D   . Nephrolithiasis   . Ventricular tachycardia ([place])     Current Outpatient Medications  Medication Sig Dispense Refill  . amiodarone (PACERONE) 200 MG tablet Take 1 tablet (200 mg total) by mouth 2 (two) times daily. 90 tablet 3  . ascorbic acid (VITAMIN C) 500 MG tablet Take 1 tablet (500 mg total) by mouth 2 (two) times daily. 100 tablet 0  . aspirin 81 MG chewable tablet Chew 1 tablet (81 mg total) by mouth daily.    Marland Kitchen  doxycycline (VIBRAMYCIN) 100 MG capsule Take 1 capsule (100 mg total) by mouth 2 (two) times daily for 10 days. 20 capsule 0  . gabapentin (NEURONTIN) 100 MG capsule Take 1 capsule (100 mg total) by mouth at bedtime. (Patient taking differently: Take 300 mg by mouth at bedtime. ) 90 capsule 10  . Insulin Pen Needle (COMFORT EZ PEN NEEDLES) 32G X 6 MM MISC 1 application by Does not apply route at bedtime. 100 each 0  . magnesium oxide (MAG-OX) 400 (241.3 Mg) MG tablet Take 1 tablet (400 mg total) by mouth 2 (two) times daily. 60 tablet 0  . Multiple Vitamin (MULTIVITAMIN WITH  MINERALS) TABS tablet Take 1 tablet by mouth daily.    . potassium chloride (K-DUR) 10 MEQ tablet Take 2 tablets (20 mEq total) by mouth every Monday, Wednesday, and Friday. 90 tablet 3  . protein supplement shake (PREMIER PROTEIN) LIQD Take 325 mLs (11 oz total) by mouth 2 (two) times daily between meals. (Patient taking differently: Take 11 oz by mouth 2 (two) times daily between meals. 3x a week)  0  . spironolactone (ALDACTONE) 25 MG tablet Take 1 tablet (25 mg total) by mouth daily. 30 tablet 11  . torsemide (DEMADEX) 20 MG tablet Take 2 tablets (40 mg total) by mouth every Monday, Wednesday, and Friday. 90 tablet 0  . warfarin (COUMADIN) 2.5 MG tablet Take 2.5 mg by mouth daily.      No current facility-administered medications for this encounter.     Penicillins  Review of systems complete and found to be negative unless listed in HPI.    Vitals:   01/07/18 0918 01/07/18 0920  BP: 104/65 (!) 82/0  Pulse: 72   SpO2: 92%   Weight: 105.1 kg (231 lb 12.8 oz)   Height: 5\' 10"  (1.778 m)     Wt Readings from Last 3 Encounters:  01/07/18 105.1 kg (231 lb 12.8 oz)  12/31/17 103.5 kg (228 lb 3.2 oz)  12/13/17 106.1 kg (234 lb)    Vital Signs:  Doppler Pressure:82 Automatc BP: 104/65 (86) HR: 72; regular SPO2: 92 %  Weight: 231.8lb w/ eqt Last weight: 228.2lb w/o eqip Home weights: 217-220lbs   Physical Exam: General:  NAD.  HEENT: normal  Neck: supple. JVP not elevated.  Carotids 2+ bilat; no bruits. No lymphadenopathy or thryomegaly appreciated. Cor: LVAD hum.  Lungs: Clear. Abdomen: obese soft, nontender, non-distended. No hepatosplenomegaly. No bruits or masses. Good bowel sounds. Driveline site clean. Anchor in place. No drainage. Site beginning to re-incorporate  Extremities: no cyanosis, clubbing, rash. Warm no edema  Neuro: alert & oriented x 3. No focal deficits. Moves all 4 without problem    ASSESSMENT AND PLAN:  1. Chronic systolic CHF with  biventricular failure-> cardiogenic shock: - Echo 08/13/2017 EF 20-25%.s/p Medtronic BiV ICD in place. Cath 12/18 with stable 1v CAD. s/p HM-3 implant 08/16/17.  - Doing well with VAD support NYHA II symptoms. Continue CR.  - Volume status looks good. - Continue torsemide to 40 mg M/W/F as ordered.  Reinforced need for daily weights and reviewed use of sliding scale diuretics. - Continue spiro 25 mg daily.  - Off digoxin. 2. 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 - On aspirin and coumadin. Denies bleeding.  - INR goal 2-2.5 INR 3.29  Discussed dosing and dietary regimen with PharmD personally. - Ramp ECHO completed 09/09/2017 with speed increased to 5300.  - VAD interrogated personally. Parameters stable. - LDH 220 - Driveline site in fection much  improved. Finish 7 days doxy. Driveline changes to 2x/week 3. CKD III: - Creatinine stable 1.3 4. [name]/VF:  - ICD shock 12/09/17 with VT in 180s.  - ICD interrogated personally. No further episodes - Continue amiodarone at 200 mg BID for one more week then change to 200 daily 5. AFL/atrial fibrillation:  - S/p previous ablation. He is currently in NSR -Continue amio for VT.On coumadin as above.  - No change.  6. CAD s/p PCI of RCA and DSK:AJGOTL cath with stable CAD as above:  - No s/s of ischemia - Continue ASA and statin. Continue CR  7. [name]: Recent A1c8.3 on 6/3. - Per PCP.  8. Anemia:  - Hgb stable at 9.4 9. Deconditioning - Stable. Continue CR 10. Buttock Pressure Ulcer: - Resolved    Glori Bickers, MD  01/07/18

## 2018-01-07 NOTE — Addendum Note (Signed)
Encounter addended by: Christinia Gully, RN on: 01/07/2018 1:23 PM  Actions taken: Sign clinical note

## 2018-01-07 NOTE — Addendum Note (Signed)
Encounter addended by: Jolaine Artist, MD on: 01/07/2018 10:49 AM  Actions taken: Visit diagnoses modified, LOS modified, Charge Capture section accepted, Sign clinical note

## 2018-01-07 NOTE — Addendum Note (Signed)
Encounter addended by: Christinia Gully, RN on: 01/07/2018 12:58 PM  Actions taken: Pend clinical note

## 2018-01-07 NOTE — Progress Notes (Signed)
Patient presents for 3 week follow up in [facility] today. Reports no problems with VAD equipment or concerns with drive line.   Pt states that he is feeling much better. He is now going to cardiac rehab 3x a week and has lost 6 lbs.   Vital Signs:  Doppler Pressure: 86 Automatc BP: 107/73 (86) HR:  65; regular SPO2: 99 %  Weight: 228.2lb w/ eqt Last weight: 234.6lb w/o eqip Home weights: 217-220lbs   VAD Indication: Destination Therapy- Implanted 08/16/17   LVAD assessment: HM III: VAD Speed: 5300 rpms Flow: 3.7 Power: 3.8 w    PI: 4.7 Alarms: no clinical alarms  Events: 5-10 PI daily  Fixed speed: 5300 Low speed limit: 5000  Primary: Back up battery expiring in 28 months. Secondary: Back up battery expiring in 30 months    I reviewed the LVAD parameters from today and compared the results to the patient's prior recorded data. LVAD interrogation was NEGATIVE for significant power changes, NEGATIVE for clinical alarms and STABLE for PI events/speed drops. Hematocrit was updated, but no other changes were made and pump is functioning within specified parameters. Pt is performing daily controller and system monitor self tests along with completing weekly and monthly maintenance for LVAD equipment.  LVAD equipment check completed and is in good working order. Back-up equipment present. Performed self-test on equipment.   Annual Equipment Maintenance on UBC/PM was performed on 09/17/17.   Exit Site Care:  VAD dressing and anchor removed and site care performed using sterile technique. Drive line exit site cleaned with Chlora prep applicators x 2, allowed to dry, and allowed to dry before gauze dressing and Aquacel silver strip re-applied. Exit site healing and partially incorporated.The velour is exposed 1/2 inch at exit site. No redness, or foul odor noted. Moderate amount of think yellow drainage present. Able to express drainage. Attempted to culture wound but after  cleaning thoroughly was unable to express anymore drainage for culture. Drive line anchor re-applied. Pt denies fever or chills. Caregiver instructed to resume every other day dressing changes.    Significant Events on VAD Support:    Device: Medtronic 3 Lead Therapies: on Last check: 10/17/16  Labs:   Hgb 9.4 - No S/S of bleeding. Specifically denies melena/BRBPR or nosebleeds.  LDH stable at 220  with a baseline ranging from 170-250.  Denies tea-colored urine. No power elevations noted on interrogation.   INR- 2.26 on 12/08/17 - within range of 2-2.5.  Plan: 1. Start Doxycycline 100 mg twice day for 10 days for driveline infection. 2. Continue Amiodarone twice daily. 3. Return to clinic next Friday at 0900.  Tanda Rockers RN, BSN VAD Coordinator 24/7 Pager [phone]

## 2018-01-07 NOTE — Addendum Note (Signed)
Encounter addended by: Christinia Gully, RN on: 01/07/2018 9:44 AM  Actions taken: Order list changed, Diagnosis association updated, Vitals modified, Order Reconciliation Section accessed, Home Medications modified, Medication List reviewed, Medication taking status modified, Sign clinical note

## 2018-01-07 NOTE — Progress Notes (Signed)
Patient presents for 3 week follow up in [facility] today. Reports no problems with VAD equipment or concerns with drive line.   Pt states that he is feeling much better. He is now going to cardiac rehab 3x a week and has lost 6 lbs.   Vital Signs:  Doppler Pressure: 82 Automatc BP: 104/65 (86) HR:  72; regular SPO2: 92 %  Weight: 231.8lb w/ eqt Last weight: 228.2lb w/o eqip Home weights: 217-220lbs   VAD Indication: Destination Therapy- Implanted 08/16/17   LVAD assessment: HM III: VAD Speed: 5300 rpms Flow: 4.2 Power: 3.8 w    PI: 3.6 Alarms: no clinical alarms  Events: 3-5 PI daily  Fixed speed: 5300 Low speed limit: 5000  Primary: Back up battery expiring in 28 months. Secondary: Back up battery expiring in 30 months    I reviewed the LVAD parameters from today and compared the results to the patient's prior recorded data. LVAD interrogation was NEGATIVE for significant power changes, NEGATIVE for clinical alarms and STABLE for PI events/speed drops. Hematocrit was updated, but no other changes were made and pump is functioning within specified parameters. Pt is performing daily controller and system monitor self tests along with completing weekly and monthly maintenance for LVAD equipment.  LVAD equipment check completed and is in good working order. Back-up equipment present. Performed self-test on equipment.   Annual Equipment Maintenance on UBC/PM was performed on 09/17/17.   Exit Site Care:  VAD dressing and anchor removed and site care performed using sterile technique. Drive line exit site cleaned with Chlora prep applicators x 2, allowed to dry, and allowed to dry before gauze dressing and Aquacel silver strip re-applied. Exit site healing and partially incorporated.The velour is exposed 1/2 inch at exit site. No redness, or foul odor noted. Small amount of yellow drainage present.  Drive line anchor re-applied. Pt denies fever or chills. Caregiver instructed  to advance to twice a week dressing changes.      Significant Events on VAD Support:    Device: Medtronic 3 Lead Therapies: on Last check: 10/17/16  Labs:   Hgb 9.4 - No S/S of bleeding. Specifically denies melena/BRBPR or nosebleeds.  LDH stable at 251 with a baseline ranging from 170-250.  Denies tea-colored urine. No power elevations noted on interrogation.   INR- 3.29 today. Adjustments made per Doroteo Bradford.  Plan: 1. Advance dressing changes to twice weekly. 2. Return to clinic in 1 month.  Tanda Rockers RN, BSN VAD Coordinator 24/7 Pager [phone]

## 2018-01-10 ENCOUNTER — Encounter (HOSPITAL_COMMUNITY)
Admission: RE | Admit: 2018-01-10 | Discharge: 2018-01-10 | Disposition: A | Discharge status: Home / Self Care | Payer: PPO | Source: Ambulatory Visit | Attending: Internal Medicine | Admitting: Internal Medicine

## 2018-01-10 ENCOUNTER — Encounter: Payer: PPO | Admitting: Internal Medicine

## 2018-01-10 ENCOUNTER — Encounter (HOSPITAL_COMMUNITY): Payer: PPO

## 2018-01-10 DIAGNOSIS — Z95811 Presence of heart assist device: Secondary | ICD-10-CM

## 2018-01-10 DIAGNOSIS — I5022 Chronic systolic (congestive) heart failure: Secondary | ICD-10-CM

## 2018-01-10 LAB — GLUCOSE, CAPILLARY: Glucose-Capillary: 279 mg/dL — ABNORMAL HIGH (ref 70–99)

## 2018-01-10 NOTE — Progress Notes (Signed)
[name] [name] 67 y.o. male Nutrition Note Spoke with pt. Nutrition Plan and Nutrition Survey goals reviewed with pt. Pt is following a Heart Healthy diet. Pt wants to follow a heart healthy diet. Heart healthy diabetic eating tips reviewed (label reading, how to build a healthy plate, portion sizes, eating frequently across the day). Pt has Type 2 Diabetes. Last A1c indicates blood glucose well-controlled. This Probation officer went over Diabetes Education test results. Pt with dx of CHF. Per discussion, pt does use canned/convenience foods often, but he reads the nutrition label and tries to keep his sodium below 2,000/ Praised pt for reading labels and encouraged him to continue. Set goal with patient to keep sodium under 1500 mg/day. Pt does not add salt to food. Pt does not eat out frequently. Pt expressed understanding of the information reviewed. Pt aware of nutrition education classes offered and does plan on attending nutrition classes.  Lab Results  Component Value Date   HGBA1C 6.3 (A) 11/11/2017    Wt Readings from Last 3 Encounters:  01/07/18 231 lb 12.8 oz (105.1 kg)  12/31/17 228 lb 3.2 oz (103.5 kg)  12/13/17 234 lb (106.1 kg)    Nutrition Diagnosis  Food-and nutrition-related knowledge deficit related to lack of exposure to information as related to diagnosis of: ? CVD ? CHF ? Type 2 Diabetes  Obese  I = 30-34.9 related to excessive energy intake as evidenced by a Body mass index is 33.82 kg/m.  Nutrition Intervention ? Pt's individual nutrition plan reviewed with pt. ? Benefits of adopting Heart Healthy diet discussed when Medficts reviewed   Goal(s)  Pt to identify food quantities necessary to achieve weight loss of 6-24 lb at graduation from cardiac rehab.   Pt to describe the benefit of including fruits, vegetables, whole grains, and low-fat dairy products in a heart healthy meal plan.  Pt to use a heart healthy oil for cooking  Pt to choose more whole grains over  refined grains   Plan:   Pt to attend nutrition classes ? Nutrition I ? Nutrition II ? Portion Distortion   Will provide client-centered nutrition education as part of interdisciplinary care  Monitor and evaluate progress toward nutrition goal with team.    Laurina Bustle, MS, RD, LDN 01/10/2018 12:08 PM

## 2018-01-11 DIAGNOSIS — Z961 Presence of intraocular lens: Secondary | ICD-10-CM | POA: Diagnosis not present

## 2018-01-11 DIAGNOSIS — H26493 Other secondary cataract, bilateral: Secondary | ICD-10-CM | POA: Diagnosis not present

## 2018-01-11 DIAGNOSIS — E113413 Type 2 diabetes mellitus with severe nonproliferative diabetic retinopathy with macular edema, bilateral: Secondary | ICD-10-CM | POA: Diagnosis not present

## 2018-01-12 ENCOUNTER — Encounter (HOSPITAL_COMMUNITY)
Admission: RE | Admit: 2018-01-12 | Discharge: 2018-01-12 | Disposition: A | Discharge status: Home / Self Care | Payer: PPO | Source: Ambulatory Visit | Attending: Internal Medicine | Admitting: Internal Medicine

## 2018-01-12 ENCOUNTER — Encounter (HOSPITAL_COMMUNITY): Payer: PPO

## 2018-01-12 DIAGNOSIS — I5022 Chronic systolic (congestive) heart failure: Secondary | ICD-10-CM

## 2018-01-12 DIAGNOSIS — Z95811 Presence of heart assist device: Secondary | ICD-10-CM

## 2018-01-14 ENCOUNTER — Telehealth (HOSPITAL_COMMUNITY): Payer: Self-pay

## 2018-01-14 ENCOUNTER — Encounter (HOSPITAL_COMMUNITY): Payer: PPO

## 2018-01-14 ENCOUNTER — Ambulatory Visit (HOSPITAL_COMMUNITY): Payer: Self-pay | Admitting: Pharmacist

## 2018-01-14 ENCOUNTER — Encounter (HOSPITAL_COMMUNITY)
Admission: RE | Admit: 2018-01-14 | Discharge: 2018-01-14 | Disposition: A | Discharge status: Home / Self Care | Payer: PPO | Source: Ambulatory Visit | Attending: Internal Medicine | Admitting: Internal Medicine

## 2018-01-14 ENCOUNTER — Other Ambulatory Visit: Payer: Self-pay | Admitting: *Deleted

## 2018-01-14 ENCOUNTER — Telehealth: Payer: Self-pay | Admitting: Neurology

## 2018-01-14 DIAGNOSIS — Z7901 Long term (current) use of anticoagulants: Secondary | ICD-10-CM

## 2018-01-14 DIAGNOSIS — Z95811 Presence of heart assist device: Secondary | ICD-10-CM | POA: Diagnosis not present

## 2018-01-14 DIAGNOSIS — I5022 Chronic systolic (congestive) heart failure: Secondary | ICD-10-CM

## 2018-01-14 LAB — POCT INR: INR: 1.8 — AB (ref 2.0–3.0)

## 2018-01-14 MED ORDER — GABAPENTIN 100 MG PO CAPS: ORAL_CAPSULE | ORAL | 3 refills | Status: DC

## 2018-01-14 MED ORDER — GABAPENTIN 300 MG PO CAPS: ORAL_CAPSULE | ORAL | 3 refills | Status: DC

## 2018-01-14 NOTE — Telephone Encounter (Signed)
Rx sent 

## 2018-01-14 NOTE — Telephone Encounter (Signed)
Mr Feutz called me this afternoon to tell me he was not feeling up to keeping our scheduled visit this afternoon because he had just finished a day of rehabilitation and was tired. I will reach out next week.

## 2018-01-14 NOTE — Telephone Encounter (Signed)
Patient is needing a new prescription sent into his pharmacy [facility] in [place]. He takes 2 in the morning and 3 at bedtime. He said he is completely out. Thanks

## 2018-01-14 NOTE — Telephone Encounter (Signed)
Patient called needing a refill on his Gabapentin. He uses [facility] in [place]. Thank you

## 2018-01-14 NOTE — Progress Notes (Signed)
I have reviewed a Home Exercise Prescription with [name] . Ah is currently exercising at home. The patient was advised to continue to walk 5-6 days a week for 20 minutes.  Safal and I discussed how to progress their exercise prescription. The patient stated that they understand the exercise prescription. We reviewed exercise guidelines, target heart rate during exercise, RPE Scale, weather conditions, NTG use, endpoints for exercise, warmup and cool down. Patient is encouraged to come to me with any questions. I will continue to follow up with the patient to assist them with progression and safety.    Carma Lair MS, ACSM CEP 1:57 PM 01/14/2018

## 2018-01-17 ENCOUNTER — Encounter (HOSPITAL_COMMUNITY)
Admission: RE | Admit: 2018-01-17 | Discharge: 2018-01-17 | Disposition: A | Discharge status: Home / Self Care | Payer: PPO | Source: Ambulatory Visit | Attending: Internal Medicine | Admitting: Internal Medicine

## 2018-01-17 ENCOUNTER — Encounter (HOSPITAL_COMMUNITY): Payer: PPO

## 2018-01-17 ENCOUNTER — Other Ambulatory Visit: Payer: Self-pay | Admitting: *Deleted

## 2018-01-17 ENCOUNTER — Telehealth: Payer: Self-pay | Admitting: *Deleted

## 2018-01-17 DIAGNOSIS — Z95811 Presence of heart assist device: Secondary | ICD-10-CM

## 2018-01-17 DIAGNOSIS — I5022 Chronic systolic (congestive) heart failure: Secondary | ICD-10-CM

## 2018-01-17 MED ORDER — GABAPENTIN 100 MG PO CAPS: ORAL_CAPSULE | ORAL | 3 refills | Status: DC

## 2018-01-17 NOTE — Telephone Encounter (Signed)
Received voicemail that patient still has not gotten his medication.  He said the correct directions are 2 po qam,  1 po at lunch and 3 po qhs.   Gabapentin sent in again to his pharmacy.

## 2018-01-18 ENCOUNTER — Ambulatory Visit: Payer: PPO | Admitting: Internal Medicine

## 2018-01-18 ENCOUNTER — Encounter: Payer: Self-pay | Admitting: Internal Medicine

## 2018-01-18 VITALS — BP 86/0 | HR 62 | Ht 71.0 in | Wt 220.0 lb

## 2018-01-18 DIAGNOSIS — Z95811 Presence of heart assist device: Secondary | ICD-10-CM | POA: Diagnosis not present

## 2018-01-18 DIAGNOSIS — I5022 Chronic systolic (congestive) heart failure: Secondary | ICD-10-CM | POA: Diagnosis not present

## 2018-01-18 DIAGNOSIS — Z95810 Presence of automatic (implantable) cardiac defibrillator: Secondary | ICD-10-CM

## 2018-01-18 DIAGNOSIS — I4729 Other ventricular tachycardia: Secondary | ICD-10-CM

## 2018-01-18 DIAGNOSIS — I472 Ventricular tachycardia: Secondary | ICD-10-CM | POA: Diagnosis not present

## 2018-01-18 DIAGNOSIS — I480 Paroxysmal atrial fibrillation: Secondary | ICD-10-CM | POA: Diagnosis not present

## 2018-01-18 MED ORDER — AMIODARONE HCL 200 MG PO TABS: ORAL_TABLET | ORAL | 3 refills | Status: DC

## 2018-01-18 NOTE — Progress Notes (Signed)
HPI Mr. Lezotte returns today for followup. He is a pleasant 67 yo man with an ICM, and VT. He developed worsening symptoms and underwent an LVAD this past summer. He developed atrial fib. He had a chest hematoma. He has received an ICD shock about a month ago and had his dose of amio increased to 200 bid. He feels well. 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 Allergies  Allergen Reactions  . Penicillins Hives, Itching and Other (See Comments)    Has patient had a PCN reaction causing immediate rash, facial/tongue/throat swelling, SOB or lightheadedness with hypotension:# # Yes # # Has patient had a PCN reaction causing severe rash involving mucus membranes or skin necrosis: No Has patient had a PCN reaction that required hospitalization: No Has patient had a PCN reaction occurring within the last 10 years: No If all of the above answers are "NO", then may proceed with Cephalosporin use.      Current Outpatient Medications  Medication Sig Dispense Refill  . amiodarone (PACERONE) 200 MG tablet Take 1 tablet (200 mg total) by mouth 2 (two) times daily. 90 tablet 3  . ascorbic acid (VITAMIN C) 500 MG tablet Take 1 tablet (500 mg total) by mouth 2 (two) times daily. 100 tablet 0  . aspirin 81 MG chewable tablet Chew 1 tablet (81 mg total) by mouth daily.    Marland Kitchen gabapentin (NEURONTIN) 100 MG capsule Take 2 in the morning 1 at lunch and 3 at bedtime. 540 capsule 3  . Insulin Pen Needle (COMFORT EZ PEN NEEDLES) 32G X 6 MM MISC 1 application by Does not apply route at bedtime. 100 each 0  . magnesium oxide (MAG-OX) 400 (241.3 Mg) MG tablet Take 1 tablet (400 mg total) by mouth 2 (two) times daily. 60 tablet 0  . Multiple Vitamin (MULTIVITAMIN WITH MINERALS) TABS tablet Take 1 tablet by mouth daily.    . potassium chloride (K-DUR) 10 MEQ tablet Take 2 tablets (20 mEq total) by mouth every Monday, Wednesday, and Friday. 90 tablet 3  . protein supplement shake (PREMIER  PROTEIN) LIQD Take 325 mLs (11 oz total) by mouth 2 (two) times daily between meals. (Patient taking differently: Take 11 oz by mouth 2 (two) times daily between meals. 3x a week)  0  . spironolactone (ALDACTONE) 25 MG tablet Take 1 tablet (25 mg total) by mouth daily. 30 tablet 11  . torsemide (DEMADEX) 20 MG tablet Take 2 tablets (40 mg total) by mouth every Monday, Wednesday, and Friday. 90 tablet 0  . warfarin (COUMADIN) 2.5 MG tablet Take 2.5 mg by mouth daily.      No current facility-administered medications for this visit.      Past Medical History:  Diagnosis Date  . AICD (automatic cardioverter/defibrillator) present 02/05/2014   Upgrade to Medtronic biventricular ICD, serial number  [id number]   . Atrial flutter ([place]) 04/2012   s/p TEE-EPS+RFCA 04/2012  . CAD (coronary artery disease) 7619,5093 X 2    RCA-T, 70% PL (off CFX), 99% Prox LAD/90% Dist LAD, S/P TAXUS stent x 2  . CHF (congestive heart failure) ([place])   . Chronic anticoagulation   . Chronic systolic heart failure ([place])   . CKD (chronic kidney disease)   . Diabetic retinopathy ([place])   . DM type 2 (diabetes mellitus, type 2) ([facility])    insulin dependent  . HTN (hypertension)   . Hypercholesteremia    ablation  . ICD (  implantable cardiac defibrillator) in place   . Ischemic cardiomyopathy March 2015   20-25% 2D   . Nephrolithiasis   . Ventricular tachycardia ([place])     ROS:   All systems reviewed and negative except as noted in the HPI.   Past Surgical History:  Procedure Laterality Date  . ATRIAL FLUTTER ABLATION N/A 05/19/2012   Procedure: ATRIAL FLUTTER ABLATION;  Surgeon: Thompson Grayer, MD;  Location: [facility] CATH LAB;  Service: Cardiovascular;  Laterality: N/A;  . BI-VENTRICULAR IMPLANTABLE CARDIOVERTER DEFIBRILLATOR UPGRADE N/A 02/05/2014   Procedure: BI-VENTRICULAR IMPLANTABLE CARDIOVERTER DEFIBRILLATOR UPGRADE;  Surgeon: Evans Lance, MD;  Location: [facility] CATH LAB;  Service: Cardiovascular;  Laterality: N/A;    . BIV ICD GENERTAOR CHANGE OUT  02/05/2014   Upgrade to Medtronic biventricular ICD, serial number  [id number] by Dr. [name]  . CARDIAC DEFIBRILLATOR PLACEMENT  2007    Medtronic Maximo VR, serial number [id number]  . INSERTION OF IMPLANTABLE LEFT VENTRICULAR ASSIST DEVICE N/A 08/16/2017   Procedure: INSERTION OF IMPLANTABLE LEFT VENTRICULAR ASSIST DEVICE-HM3;  Surgeon: Ivin Poot, MD;  Location: [place];  Service: Open Heart Surgery;  Laterality: N/A;  . IR FLUORO GUIDE CV LINE RIGHT  08/12/2017  . IR US GUIDE VASC ACCESS RIGHT  08/12/2017  . MEDIASTINAL EXPLORATION  08/17/2017   Procedure: MEDIASTINAL REXPLORATION with evacuation of hematoma;  Surgeon: Prescott Gum, Collier Salina, MD;  Location: [facility] OR;  Service: Open Heart Surgery;;  . PERCUTANEOUS CORONARY STENT INTERVENTION (PCI-S)  January 2002   PTCA/Stent Distal RCA  . PERCUTANEOUS CORONARY STENT INTERVENTION (PCI-S)  June 2002   PTCA/Stent x 3 RCA, thrombolysis - failed  . PERCUTANEOUS CORONARY STENT INTERVENTION (PCI-S)  July 2006   TAXUS stents to prox and distal LAD  . RIGHT HEART CATH N/A 07/22/2017   Procedure: RIGHT HEART CATH;  Surgeon: Jolaine Artist, MD;  Location: [place] CV LAB;  Service: Cardiovascular;  Laterality: N/A;  . RIGHT HEART CATH N/A 08/13/2017   Procedure: RIGHT HEART CATH - swan;  Surgeon: Jolaine Artist, MD;  Location: [place] CV LAB;  Service: Cardiovascular;  Laterality: N/A;  . RIGHT/LEFT HEART CATH AND CORONARY ANGIOGRAPHY N/A 02/18/2017   Procedure: RIGHT/LEFT HEART CATH AND CORONARY ANGIOGRAPHY;  Surgeon: Jolaine Artist, MD;  Location: [place] CV LAB;  Service: Cardiovascular;  Laterality: N/A;  . STERNAL CLOSURE N/A 08/19/2017   Procedure: STERNAL CLOSURE;  Surgeon: Ivin Poot, MD;  Location: [place];  Service: Thoracic;  Laterality: N/A;  . TEE WITHOUT CARDIOVERSION N/A 08/16/2017   Procedure: TRANSESOPHAGEAL ECHOCARDIOGRAM (TEE);  Surgeon: Prescott Gum, Collier Salina, MD;  Location: [place];   Service: Open Heart Surgery;  Laterality: N/A;  . TEE WITHOUT CARDIOVERSION N/A 08/19/2017   Procedure: TRANSESOPHAGEAL ECHOCARDIOGRAM (TEE);  Surgeon: Prescott Gum, Collier Salina, MD;  Location: [place];  Service: Thoracic;  Laterality: N/A;  . TRICUSPID VALVE REPLACEMENT N/A 08/16/2017   Procedure: TRICUSPID VALVE REPAIR using Oletta Lamas MC3 Ring size 30;  Surgeon: Ivin Poot, MD;  Location: [place];  Service: Open Heart Surgery;  Laterality: N/A;     Family History  Problem Relation Age of Onset  . Heart failure Father        Deceased  . Alzheimer's disease Mother        Living  . Heart attack Mother   . CAD Brother   . Other Brother        blood cancer  . CAD Brother   . Prostate cancer Brother   .  CAD Brother   . CAD Brother   . Healthy Son   . Healthy Daughter   . Diabetes Brother   . Stomach cancer Neg Hx   . Colon cancer Neg Hx      Social History   Socioeconomic History  . Marital status: Married    Spouse name: Not on file  . Number of children: 4  . Years of education: 95  . Highest education level: Not on file  Occupational History  . Occupation: Psychiatrist - currently unemployed  Social Needs  . Financial resource strain: Not very hard  . Food insecurity:    Worry: Never true    Inability: Never true  . Transportation needs:    Medical: No    Non-medical: No  Tobacco Use  . Smoking status: Former Smoker    Packs/day: 1.00    Years: 29.00    Pack years: 29.00    Types: Cigarettes    Last attempt to quit: 03/02/2000    Years since quitting: 17.8  . Smokeless tobacco: Never Used  Substance and Sexual Activity  . Alcohol use: No    Alcohol/week: 0.0 standard drinks  . Drug use: No  . Sexual activity: Yes    Partners: Female    Birth control/protection: None  Lifestyle  . Physical activity:    Days per week: Patient refused    Minutes per session: Patient refused  . Stress: Only a little  Relationships  . Social connections:    Talks on phone: Patient  refused    Gets together: Patient refused    Attends religious service: Patient refused    Active member of club or organization: Patient refused    Attends meetings of clubs or organizations: Patient refused    Relationship status: Patient refused  . Intimate partner violence:    Fear of current or ex partner: Patient refused    Emotionally abused: Patient refused    Physically abused: Patient refused    Forced sexual activity: Patient refused  Other Topics Concern  . Not on file  Social History Narrative   Pt lives with wife in a one story home - married for 32 years. He hunts regularly and is active, walking > 1 mile at times without difficulty.  Has 4 children.     Retired Horticulturist, commercial.  Education: some college.     BP (!) 86/0   Pulse 62   Ht 5\' 11"  (1.803 m)   Wt 220 lb (99.8 kg)   SpO2 98%   BMI 30.68 kg/m   Physical Exam:  Well appearing NAD HEENT: Unremarkable Neck:  No JVD, no thyromegally Lymphatics:  No adenopathy Back:  No CVA tenderness Lungs:  Clear with no wheezes HEART:  Regular rate rhythm, high frequency hum is present. Abd:  soft, positive bowel sounds, no organomegally, no rebound, no guarding Ext:  2 plus pulses, no edema, no cyanosis, no clubbing Skin:  No rashes no nodules Neuro:  CN II through XII intact, motor grossly intact  EKG - nsr with ventricular pacing with noise artifact  DEVICE  Normal device function.  See PaceArt for details.   Assess/Plan: 1. VT - he has had no additional VT. I have asked him to reduce his dose of amio to 200/100 in a.m. and p.m. 2. PAF - he has been maintaining NSR since his ICD shock for VT. 3. ICD - interogation of his medtronic biv ICD demonstrates normal function and over 3 years of longevity. He  will remain with his RV below threshold as his QRS looks very good with LV only capture  [employer].D.

## 2018-01-18 NOTE — Patient Instructions (Addendum)
Medication Instructions:  Your physician has recommended you make the following change in your medication:   1.  Reduce your amiodarone 200 mg ---Take one tablet by mouth in the morning.  Take a 1/2 tablet by mouth at night.  Labwork: None ordered.  Testing/Procedures: None ordered.  Follow-Up: Your physician wants you to follow-up in: 4 months with Dr. [name].      Remote monitoring is used to monitor your ICD from home. This monitoring reduces the number of office visits required to check your device to one time per year. It allows [place] to keep an eye on the functioning of your device to ensure it is working properly. You are scheduled for a device check from home on 02/10/2018. You may send your transmission at any time that day. If you have a wireless device, the transmission will be sent automatically. After your physician reviews your transmission, you will receive a postcard with your next transmission date.  Any Other Special Instructions Will Be Listed Below (If Applicable).  If you need a refill on your cardiac medications before your next appointment, please call your pharmacy.

## 2018-01-19 ENCOUNTER — Encounter (HOSPITAL_COMMUNITY): Payer: PPO

## 2018-01-19 ENCOUNTER — Encounter (HOSPITAL_COMMUNITY)
Admission: RE | Admit: 2018-01-19 | Discharge: 2018-01-19 | Disposition: A | Discharge status: Home / Self Care | Payer: PPO | Source: Ambulatory Visit | Attending: Internal Medicine | Admitting: Internal Medicine

## 2018-01-19 DIAGNOSIS — I5022 Chronic systolic (congestive) heart failure: Secondary | ICD-10-CM

## 2018-01-19 DIAGNOSIS — Z95811 Presence of heart assist device: Secondary | ICD-10-CM | POA: Diagnosis not present

## 2018-01-20 ENCOUNTER — Encounter (HOSPITAL_COMMUNITY): Payer: Self-pay

## 2018-01-20 NOTE — Progress Notes (Signed)
Cardiac Individual Treatment Plan  Patient Details  Name: [name] [name] MRN: [id number] Date of Birth: Feb 27, 1951 Referring Provider:     CARDIAC REHAB PHASE II ORIENTATION from 12/09/2017 in [place]  Referring Provider  Glori Bickers MD      Initial Encounter Date:    CARDIAC REHAB PHASE II ORIENTATION from 12/09/2017 in [place]  Date  12/09/17      Visit Diagnosis: Heart failure, chronic systolic ([place])  Left ventricular assist device present ([place])  Patient's Home Medications on Admission:  Current Outpatient Medications:  .  amiodarone (PACERONE) 200 MG tablet, Take one tablet in the AM.  Take 1/2 tablet in the PM, Disp: 150 tablet, Rfl: 3 .  ascorbic acid (VITAMIN C) 500 MG tablet, Take 1 tablet (500 mg total) by mouth 2 (two) times daily., Disp: 100 tablet, Rfl: 0 .  aspirin 81 MG chewable tablet, Chew 1 tablet (81 mg total) by mouth daily., Disp: , Rfl:  .  gabapentin (NEURONTIN) 100 MG capsule, Take 2 in the morning 1 at lunch and 3 at bedtime., Disp: 540 capsule, Rfl: 3 .  Insulin Pen Needle (COMFORT EZ PEN NEEDLES) 32G X 6 MM MISC, 1 application by Does not apply route at bedtime., Disp: 100 each, Rfl: 0 .  magnesium oxide (MAG-OX) 400 (241.3 Mg) MG tablet, Take 1 tablet (400 mg total) by mouth 2 (two) times daily., Disp: 60 tablet, Rfl: 0 .  Multiple Vitamin (MULTIVITAMIN WITH MINERALS) TABS tablet, Take 1 tablet by mouth daily., Disp: , Rfl:  .  potassium chloride (K-DUR) 10 MEQ tablet, Take 2 tablets (20 mEq total) by mouth every Monday, Wednesday, and Friday., Disp: 90 tablet, Rfl: 3 .  protein supplement shake (PREMIER PROTEIN) LIQD, Take 325 mLs (11 oz total) by mouth 2 (two) times daily between meals. (Patient taking differently: Take 11 oz by mouth 2 (two) times daily between meals. 3x a week), Disp: , Rfl: 0 .  spironolactone (ALDACTONE) 25 MG tablet, Take 1 tablet (25 mg total) by mouth  daily., Disp: 30 tablet, Rfl: 11 .  torsemide (DEMADEX) 20 MG tablet, Take 2 tablets (40 mg total) by mouth every Monday, Wednesday, and Friday., Disp: 90 tablet, Rfl: 0 .  warfarin (COUMADIN) 2.5 MG tablet, Take 2.5 mg by mouth daily. , Disp: , Rfl:   Past Medical History: Past Medical History:  Diagnosis Date  . AICD (automatic cardioverter/defibrillator) present 02/05/2014   Upgrade to [employer]onic biventricular ICD, serial number  [id number]   . Atrial flutter ([place]) 04/2012   s/p TEE-EPS+RFCA 04/2012  . CAD (coronary artery disease) 2297,[id number] X 2    RCA-T, 70% PL (off CFX), 99% Prox LAD/90% Dist LAD, S/P TAXUS stent x 2  . CHF (congestive heart failure) ([place])   . Chronic anticoagulation   . Chronic systolic heart failure ([place])   . CKD (chronic kidney disease)   . Diabetic retinopathy ([place])   . DM type 2 (diabetes mellitus, type 2) ([facility])    insulin dependent  . HTN (hypertension)   . Hypercholesteremia    ablation  . ICD (implantable cardiac defibrillator) in place   . Ischemic cardiomyopathy March 2015   20-25% 2D   . Nephrolithiasis   . Ventricular tachycardia ([facility])     Tobacco Use: Social History   Tobacco Use  Smoking Status Former Smoker  . Packs/day: 1.00  . Years: 29.00  . Pack years: 29.00  . Types: Cigarettes  .  Last attempt to quit: 03/02/2000  . Years since quitting: 17.8  Smokeless Tobacco Never Used    Labs: Recent Chemical engineer    Labs for ITP Cardiac and Pulmonary Rehab Latest Ref Rng & Units 09/06/2017 09/07/2017 09/08/2017 09/09/2017 11/11/2017   Cholestrol <200 mg/dL - - - - -   LDLCALC <100 mg/dL - - - - -   LDLDIRECT mg/dL - - - - -   HDL >40 mg/dL - - - - -   Trlycerides <150 mg/dL - - - - -   Hemoglobin A1c 4.0 - 5.6 % - - - - 6.3(A)   PHART 7.350 - 7.450 - - - - -   PCO2ART 32.0 - 48.0 mmHg - - - - -   HCO3 20.0 - 28.0 mmol/L - - - - -   TCO2 22 - 32 mmol/L - - - - -   ACIDBASEDEF 0.0 - 2.0 mmol/L - - - - -   O2SAT % 61.5 54.4 60.9 52.1  -      Capillary Blood Glucose: Lab Results  Component Value Date   GLUCAP 279 (H) 01/10/2018   GLUCAP 121 (H) 12/27/2017   GLUCAP 227 (H) 12/24/2017   GLUCAP 237 (H) 12/20/2017   GLUCAP 184 (H) 12/15/2017     Exercise Target Goals: Exercise Program Goal: Individual exercise prescription set using results from initial 6 min walk test and THRR while considering  patient's activity barriers and safety.   Exercise Prescription Goal: Initial exercise prescription builds to 30-45 minutes a day of aerobic activity, 2-3 days per week.  Home exercise guidelines will be given to patient during program as part of exercise prescription that the participant will acknowledge.  Activity Barriers & Risk Stratification: Activity Barriers & Cardiac Risk Stratification - 12/09/17 1131      Activity Barriers & Cardiac Risk Stratification   Activity Barriers  Deconditioning;Muscular Weakness    Cardiac Risk Stratification  High       6 Minute Walk: 6 Minute Walk    Row Name 12/09/17 1007 12/09/17 1127       6 Minute Walk   Phase  Initial  Initial    Distance  -  685 feet    Walk Time  -  4.15 minutes    # of Rest Breaks  -  1    MPH  -  1.29    METS  -  1.34    RPE  -  12    Perceived Dyspnea   -  0    VO2 Peak  -  4.7    Symptoms  -  Yes (comment)    Comments  -  Leg Fatigue     Resting HR  -  89 bpm    Resting BP  -  - MAP: 84    Resting Oxygen Saturation   -  96 %    Max Ex. HR  -  103 bpm    Max Ex. BP  -  - MAP: 88    2 Minute Post BP  -  - MAP: 78       Oxygen Initial Assessment:   Oxygen Re-Evaluation:   Oxygen Discharge (Final Oxygen Re-Evaluation):   Initial Exercise Prescription: Initial Exercise Prescription - 12/09/17 1100      Date of Initial Exercise RX and Referring Provider   Date  12/09/17    Referring Provider  Glori Bickers MD    Expected Discharge Date  03/18/18  NuStep   Level  1    SPM  75    Minutes  10    METs  1.5      Arm  Ergometer   Level  1    Watts  10    Minutes  10    METs  1.5      Track   Laps  5    Minutes  10    METs  1.84      Prescription Details   Frequency (times per week)  3    Duration  Progress to 30 minutes of continuous aerobic without signs/symptoms of physical distress      Intensity   THRR 40-80% of Max Heartrate  61-122    Ratings of Perceived Exertion  11-13    Perceived Dyspnea  0-4      Progression   Progression  Continue progressive overload as per policy without signs/symptoms or physical distress.      Resistance Training   Training Prescription  Yes    Weight  2    Reps  10-15       Perform Capillary Blood Glucose checks as needed.  Exercise Prescription Changes: Exercise Prescription Changes    Row Name 12/15/17 1300 12/29/17 1000 01/10/18 1400 01/19/18 1143       Response to Exercise   Blood Pressure (Admit)  - Doppler:84  - MAP: 80  - MAP: 78  - MAP: 72    Blood Pressure (Exercise)  - Doppler:86  - MAP: 84  - MAP: 84  - MAP: 78    Blood Pressure (Exit)  - Doppler: 72  - MAP: 80  - MAP:92  - MAP: 82    Heart Rate (Admit)  76 bpm  68 bpm  68 bpm  60 bpm    Heart Rate (Exercise)  86 bpm  81 bpm  85 bpm  78 bpm    Heart Rate (Exit)  76 bpm  68 bpm  73 bpm  58 bpm    Rating of Perceived Exertion (Exercise)  13  12  12  14     Perceived Dyspnea (Exercise)  0  0  0  0    Symptoms  None  None  None  None    Comments  Pt oriented to exercise equipment   None  None  None    Duration  Progress to 30 minutes of  aerobic without signs/symptoms of physical distress  Progress to 30 minutes of  aerobic without signs/symptoms of physical distress  Progress to 30 minutes of  aerobic without signs/symptoms of physical distress  Continue with 30 min of aerobic exercise without signs/symptoms of physical distress.    Intensity  THRR unchanged  THRR unchanged  THRR unchanged  THRR unchanged      Progression   Progression  Continue to progress workloads to maintain intensity  without signs/symptoms of physical distress.  Continue to progress workloads to maintain intensity without signs/symptoms of physical distress.  Continue to progress workloads to maintain intensity without signs/symptoms of physical distress.  Continue to progress workloads to maintain intensity without signs/symptoms of physical distress.    Average METs  1.65  1.79  2.09  2.02      Resistance Training   Training Prescription  No  Yes  Yes  No    Weight  -  2lbs  3lbs  -    Reps  -  10-15  10-15  -    Time  -  10 Minutes  10 Minutes  -      Interval Training   Interval Training  No  -  No  -      NuStep   Level  1  1  3  3     SPM  75  75  85  85    Minutes  10  10  10  10     METs  1.6  1.9  1.9  1.7      Arm Ergometer   Level  1  1  1  1     Watts  10  10  10  12     Minutes  10  10  10  10     METs  1.5  1.6  1.6  1.8      Track   Laps  4  5  9  9     Minutes  10  10  10  10     METs  1.7  1.84  2.53  2.53      Home Exercise Plan   Plans to continue exercise at  -  -  -  Home (comment) Walking    Frequency  -  -  -  Add 2 additional days to program exercise sessions.    Initial Home Exercises Provided  -  -  -  01/14/18       Exercise Comments: Exercise Comments    Row Name 12/15/17 1007 01/14/18 1354         Exercise Comments  Pt's first day of exericse. Pt responded well to exercise prescription. Will continue to monitor and progress pt.   Reviewed HEP with pt. Pt verbalized understanding and is currently walking for exericse. Will continue to follow up with pt regarding home exercise.          Exercise Goals and Review: Exercise Goals    Row Name 12/09/17 1131             Exercise Goals   Increase Physical Activity  Yes       Intervention  Provide advice, education, support and counseling about physical activity/exercise needs.;Develop an individualized exercise prescription for aerobic and resistive training based on initial evaluation findings, risk  stratification, comorbidities and participant's personal goals.       Expected Outcomes  Short Term: Attend rehab on a regular basis to increase amount of physical activity.;Long Term: Add in home exercise to make exercise part of routine and to increase amount of physical activity.;Long Term: Exercising regularly at least 3-5 days a week.       Increase Strength and Stamina  Yes       Intervention  Provide advice, education, support and counseling about physical activity/exercise needs.;Develop an individualized exercise prescription for aerobic and resistive training based on initial evaluation findings, risk stratification, comorbidities and participant's personal goals.       Expected Outcomes  Short Term: Increase workloads from initial exercise prescription for resistance, speed, and METs.;Short Term: Perform resistance training exercises routinely during rehab and add in resistance training at home;Long Term: Improve cardiorespiratory fitness, muscular endurance and strength as measured by increased METs and functional capacity (6MWT)       Able to understand and use rate of perceived exertion (RPE) scale  Yes       Intervention  Provide education and explanation on how to use RPE scale       Expected Outcomes  Short Term: Able to use RPE daily in rehab to  express subjective intensity level;Long Term:  Able to use RPE to guide intensity level when exercising independently       Knowledge and understanding of Target Heart Rate Range (THRR)  Yes       Intervention  Provide education and explanation of THRR including how the numbers were predicted and where they are located for reference       Expected Outcomes  Short Term: Able to state/look up THRR;Short Term: Able to use daily as guideline for intensity in rehab;Long Term: Able to use THRR to govern intensity when exercising independently       Able to check pulse independently  Yes       Intervention  Provide education and demonstration on how to  check pulse in carotid and radial arteries.;Review the importance of being able to check your own pulse for safety during independent exercise       Expected Outcomes  Short Term: Able to explain why pulse checking is important during independent exercise;Long Term: Able to check pulse independently and accurately       Understanding of Exercise Prescription  Yes       Intervention  Provide education, explanation, and written materials on patient's individual exercise prescription       Expected Outcomes  Short Term: Able to explain program exercise prescription;Long Term: Able to explain home exercise prescription to exercise independently          Exercise Goals Re-Evaluation : Exercise Goals Re-Evaluation    Row Name 01/14/18 1355             Exercise Goal Re-Evaluation   Exercise Goals Review  Increase Physical Activity;Able to understand and use rate of perceived exertion (RPE) scale;Knowledge and understanding of Target Heart Rate Range (THRR);Understanding of Exercise Prescription;Increase Strength and Stamina;Able to check pulse independently       Comments  Reviewed HEP with pt. Also reviewed THRR, RPE Scale, weather conditions, endpoints of exercise, NTG use, when to call Dr/911, warmup and cool down.        Expected Outcomes  Pt will continue to walk 5-7 days a week for 20 minutes. Pt will continue to increase cardiovascular strength. Pt states he is very grateful for rehab. Will continue to monitor.           Discharge Exercise Prescription (Final Exercise Prescription Changes): Exercise Prescription Changes - 01/19/18 1143      Response to Exercise   Blood Pressure (Admit)  --   MAP: 72   Blood Pressure (Exercise)  --   MAP: 78   Blood Pressure (Exit)  --   MAP: 82   Heart Rate (Admit)  60 bpm    Heart Rate (Exercise)  78 bpm    Heart Rate (Exit)  58 bpm    Rating of Perceived Exertion (Exercise)  14    Perceived Dyspnea (Exercise)  0    Symptoms  None    Comments   None    Duration  Continue with 30 min of aerobic exercise without signs/symptoms of physical distress.    Intensity  THRR unchanged      Progression   Progression  Continue to progress workloads to maintain intensity without signs/symptoms of physical distress.    Average METs  2.02      Resistance Training   Training Prescription  No      NuStep   Level  3    SPM  85    Minutes  10    METs  1.7      Arm Ergometer   Level  1    Watts  12    Minutes  10    METs  1.8      Track   Laps  9    Minutes  10    METs  2.53      Home Exercise Plan   Plans to continue exercise at  Home (comment)   Walking   Frequency  Add 2 additional days to program exercise sessions.    Initial Home Exercises Provided  01/14/18       Nutrition:  Target Goals: Understanding of nutrition guidelines, daily intake of sodium 1500mg , cholesterol 200mg , calories 30% from fat and 7% or less from saturated fats, daily to have 5 or more servings of fruits and vegetables.  Biometrics: Pre Biometrics - 12/09/17 1130      Pre Biometrics   Height  5\' 10"  (1.778 m)    Weight  106.9 kg    Waist Circumference  44 inches    Hip Circumference  44.5 inches    Waist to Hip Ratio  0.99 %    BMI (Calculated)  33.82    Triceps Skinfold  24 mm    % Body Fat  33.8 %    Grip Strength  33 kg    Flexibility  11 in    Single Leg Stand  4.77 seconds        Nutrition Therapy Plan and Nutrition Goals:   Nutrition Assessments:   Nutrition Goals Re-Evaluation:   Nutrition Goals Re-Evaluation:   Nutrition Goals Discharge (Final Nutrition Goals Re-Evaluation):   Psychosocial: Target Goals: Acknowledge presence or absence of significant depression and/or stress, maximize coping skills, provide positive support system. Participant is able to verbalize types and ability to use techniques and skills needed for reducing stress and depression.  Initial Review & Psychosocial Screening: Initial Psych Review  & Screening - 12/09/17 1213      Initial Review   Current issues with  Current Stress Concerns    Source of Stress Concerns  Chronic Illness      Family Dynamics   Good Support System?  Yes   Pt lists his wife [name] as a source of support. She is present with [name] [name] at the orientation appointment.      Barriers   Psychosocial barriers to participate in program  There are no identifiable barriers or psychosocial needs.      Screening Interventions   Interventions  Encouraged to exercise    Expected Outcomes  Short Term goal: Identification and review with participant of any Quality of Life or Depression concerns found by scoring the questionnaire.;Long Term goal: The participant improves quality of Life and PHQ9 Scores as seen by post scores and/or verbalization of changes       Quality of Life Scores: Quality of Life - 12/09/17 1141      Quality of Life   Select  Quality of Life      Quality of Life Scores   Health/Function Pre  17.71 %    Socioeconomic Pre  20.1 %    Psych/Spiritual Pre  22 %    Family Pre  22.8 %    GLOBAL Pre  19.89 %      Scores of 19 and below usually indicate a poorer quality of life in these areas.  A difference of  2-3 points is a clinically meaningful difference.  A difference of 2-3 points in the total score of  the Quality of Life Index has been associated with significant improvement in overall quality of life, self-image, physical symptoms, and general health in studies assessing change in quality of life.  PHQ-9: Recent Review Flowsheet Data    Depression screen [facility] 2/9 12/15/2017 10/08/2017 09/29/2017   Decreased Interest 0 0 0   Down, Depressed, Hopeless 0 0 0   PHQ - 2 Score 0 0 0     Interpretation of Total Score  Total Score Depression Severity:  1-4 = Minimal depression, 5-9 = Mild depression, 10-14 = Moderate depression, 15-19 = Moderately severe depression, 20-27 = Severe depression   Psychosocial Evaluation and  Intervention: Psychosocial Evaluation - 12/15/17 1436      Psychosocial Evaluation & Interventions   Interventions  Encouraged to exercise with the program and follow exercise prescription;Stress management education;Relaxation education    Comments  [name] [name] has stress concerns related to his chronic illness and depending on others.  He is adjusting to his decrease in independence as he progresses.  He maintains a positive outlook and relies on his faith.     Expected Outcomes  [name] [name] will continue to maintain a positive outlook and utilize his faith when in need.    Continue Psychosocial Services   No Follow up required       Psychosocial Re-Evaluation: Psychosocial Re-Evaluation    [place] Name 12/30/17 1052 01/20/18 1220           Psychosocial Re-Evaluation   Current issues with  Current Stress Concerns  Current Stress Concerns      Comments  [name] [name] has stress concerns related to his chronic illness but is coping well.  No current psychosocial needs identified.   [name] [name] has stress concerns related to his chronic illness but is coping well.  No current psychosocial needs identified.       Expected Outcomes  [name] [name] will continue to exhibit a positive outlook with good coping skills.  [name] [name] will continue to exhibit a positive outlook with good coping skills.      Interventions  Relaxation education;Stress management education;Encouraged to attend Cardiac Rehabilitation for the exercise  Relaxation education;Stress management education;Encouraged to attend Cardiac Rehabilitation for the exercise      Continue Psychosocial Services   Follow up required by staff  Follow up required by staff         Psychosocial Discharge (Final Psychosocial Re-Evaluation): Psychosocial Re-Evaluation - 01/20/18 1220      Psychosocial Re-Evaluation   Current issues with  Current Stress Concerns    Comments  [name] [name] has stress concerns related to his chronic illness but is coping well.  No current psychosocial needs  identified.     Expected Outcomes  [name] [name] will continue to exhibit a positive outlook with good coping skills.    Interventions  Relaxation education;Stress management education;Encouraged to attend Cardiac Rehabilitation for the exercise    Continue Psychosocial Services   Follow up required by staff       Vocational Rehabilitation: Provide vocational rehab assistance to qualifying candidates.   Vocational Rehab Evaluation & Intervention: Vocational Rehab - 12/09/17 1211      Initial Vocational Rehab Evaluation & Intervention   Assessment shows need for Vocational Rehabilitation  No       Education: Education Goals: Education classes will be provided on a weekly basis, covering required topics. Participant will state understanding/return demonstration of topics presented.  Learning Barriers/Preferences: Learning Barriers/Preferences - 12/09/17 1142      Learning Barriers/Preferences   Learning Barriers  Sight  Learning Preferences  Written Material;Individual Instruction       Education Topics: Count Your Pulse:  -Group instruction provided by verbal instruction, demonstration, patient participation and written materials to support subject.  Instructors address importance of being able to find your pulse and how to count your pulse when at home without a heart monitor.  Patients get hands on experience counting their pulse with staff help and individually.   Heart Attack, Angina, and Risk Factor Modification:  -Group instruction provided by verbal instruction, video, and written materials to support subject.  Instructors address signs and symptoms of angina and heart attacks.    Also discuss risk factors for heart disease and how to make changes to improve heart health risk factors.   Functional Fitness:  -Group instruction provided by verbal instruction, demonstration, patient participation, and written materials to support subject.  Instructors address safety measures for  doing things around the house.  Discuss how to get up and down off the floor, how to pick things up properly, how to safely get out of a chair without assistance, and balance training.   CARDIAC REHAB PHASE II EXERCISE from 01/14/2018 in [place]  Date  01/14/18  Instruction Review Code  2- Demonstrated Understanding      Meditation and Mindfulness:  -Group instruction provided by verbal instruction, patient participation, and written materials to support subject.  Instructor addresses importance of mindfulness and meditation practice to help reduce stress and improve awareness.  Instructor also leads participants through a meditation exercise.    Stretching for Flexibility and Mobility:  -Group instruction provided by verbal instruction, patient participation, and written materials to support subject.  Instructors lead participants through series of stretches that are designed to increase flexibility thus improving mobility.  These stretches are additional exercise for major muscle groups that are typically performed during regular warm up and cool down.   Hands Only CPR:  -Group verbal, video, and participation provides a basic overview of AHA guidelines for community CPR. Role-play of emergencies allow participants the opportunity to practice calling for help and chest compression technique with discussion of AED use.   Hypertension: -Group verbal and written instruction that provides a basic overview of hypertension including the most recent diagnostic guidelines, risk factor reduction with self-care instructions and medication management.    Nutrition I class: Heart Healthy Eating:  -Group instruction provided by PowerPoint slides, verbal discussion, and written materials to support subject matter. The instructor gives an explanation and review of the Therapeutic Lifestyle Changes diet recommendations, which includes a discussion on lipid goals, dietary  fat, sodium, fiber, plant stanol/sterol esters, sugar, and the components of a well-balanced, healthy diet.   Nutrition II class: Lifestyle Skills:  -Group instruction provided by PowerPoint slides, verbal discussion, and written materials to support subject matter. The instructor gives an explanation and review of label reading, grocery shopping for heart health, heart healthy recipe modifications, and ways to make healthier choices when eating out.   Diabetes Question & Answer:  -Group instruction provided by PowerPoint slides, verbal discussion, and written materials to support subject matter. The instructor gives an explanation and review of diabetes co-morbidities, pre- and post-prandial blood glucose goals, pre-exercise blood glucose goals, signs, symptoms, and treatment of hypoglycemia and hyperglycemia, and foot care basics.   Diabetes Blitz:  -Group instruction provided by PowerPoint slides, verbal discussion, and written materials to support subject matter. The instructor gives an explanation and review of the physiology behind type 1 and type  2 diabetes, diabetes medications and rational behind using different medications, pre- and post-prandial blood glucose recommendations and Hemoglobin A1c goals, diabetes diet, and exercise including blood glucose guidelines for exercising safely.    Portion Distortion:  -Group instruction provided by PowerPoint slides, verbal discussion, written materials, and food models to support subject matter. The instructor gives an explanation of serving size versus portion size, changes in portions sizes over the last 20 years, and what consists of a serving from each food group.   Stress Management:  -Group instruction provided by verbal instruction, video, and written materials to support subject matter.  Instructors review role of stress in heart disease and how to cope with stress positively.     CARDIAC REHAB PHASE II EXERCISE from 01/14/2018 in  [facility]  Date  01/05/18  Instruction Review Code  2- Demonstrated Understanding      Exercising on Your Own:  -Group instruction provided by verbal instruction, power point, and written materials to support subject.  Instructors discuss benefits of exercise, components of exercise, frequency and intensity of exercise, and end points for exercise.  Also discuss use of nitroglycerin and activating EMS.  Review options of places to exercise outside of rehab.  Review guidelines for sex with heart disease.   CARDIAC REHAB PHASE II EXERCISE from 01/14/2018 in [facility]  Date  12/15/17  Educator  EP  Instruction Review Code  2- Demonstrated Understanding      Cardiac Drugs I:  -Group instruction provided by verbal instruction and written materials to support subject.  Instructor reviews cardiac drug classes: antiplatelets, anticoagulants, beta blockers, and statins.  Instructor discusses reasons, side effects, and lifestyle considerations for each drug class.   CARDIAC REHAB PHASE II EXERCISE from 01/14/2018 in [facility]  Date  01/12/18  Educator  Pharmcist  Instruction Review Code  2- Demonstrated Understanding      Cardiac Drugs II:  -Group instruction provided by verbal instruction and written materials to support subject.  Instructor reviews cardiac drug classes: angiotensin converting enzyme inhibitors (ACE-I), angiotensin II receptor blockers (ARBs), nitrates, and calcium channel blockers.  Instructor discusses reasons, side effects, and lifestyle considerations for each drug class.   Anatomy and Physiology of the Circulatory System:  Group verbal and written instruction and models provide basic cardiac anatomy and physiology, with the coronary electrical and arterial systems. Review of: AMI, Angina, Valve disease, Heart Failure, Peripheral Artery Disease, Cardiac Arrhythmia, Pacemakers,  and the ICD.   Other Education:  -Group or individual verbal, written, or video instructions that support the educational goals of the cardiac rehab program.   Holiday Eating Survival Tips:  -Group instruction provided by PowerPoint slides, verbal discussion, and written materials to support subject matter. The instructor gives patients tips, tricks, and techniques to help them not only survive but enjoy the holidays despite the onslaught of food that accompanies the holidays.   Knowledge Questionnaire Score: Knowledge Questionnaire Score - 12/09/17 1143      Knowledge Questionnaire Score   Pre Score  19/24       Core Components/Risk Factors/Patient Goals at Admission: Personal Goals and Risk Factors at Admission - 12/09/17 1144      Core Components/Risk Factors/Patient Goals on Admission    Weight Management  Yes;Weight Loss    Intervention  Weight Management: Develop a combined nutrition and exercise program designed to reach desired caloric intake, while maintaining appropriate intake of nutrient and fiber, sodium  and fats, and appropriate energy expenditure required for the weight goal.;Weight Management: Provide education and appropriate resources to help participant work on and attain dietary goals.;Weight Management/Obesity: Establish reasonable short term and long term weight goals.;Obesity: Provide education and appropriate resources to help participant work on and attain dietary goals.    Admit Weight  235 lb 10.8 oz (106.9 kg)    Goal Weight: Long Term  225 lb (102.1 kg)    Expected Outcomes  Short Term: Continue to assess and modify interventions until short term weight is achieved;Long Term: Adherence to nutrition and physical activity/exercise program aimed toward attainment of established weight goal;Weight Loss: Understanding of general recommendations for a balanced deficit meal plan, which promotes 1-2 lb weight loss per week and includes a negative energy balance of  [id number] kcal/d;Understanding recommendations for meals to include 15-35% energy as protein, 25-35% energy from fat, 35-60% energy from carbohydrates, less than 200mg  of dietary cholesterol, 20-35 gm of total fiber daily;Understanding of distribution of calorie intake throughout the day with the consumption of 4-5 meals/snacks    Diabetes  Yes    Intervention  Provide education about signs/symptoms and action to take for hypo/hyperglycemia.;Provide education about proper nutrition, including hydration, and aerobic/resistive exercise prescription along with prescribed medications to achieve blood glucose in normal ranges: Fasting glucose 65-99 mg/dL    Expected Outcomes  Short Term: Participant verbalizes understanding of the signs/symptoms and immediate care of hyper/hypoglycemia, proper foot care and importance of medication, aerobic/resistive exercise and nutrition plan for blood glucose control.;Long Term: Attainment of HbA1C < 7%.    Heart Failure  Yes    Intervention  Provide a combined exercise and nutrition program that is supplemented with education, support and counseling about heart failure. Directed toward relieving symptoms such as shortness of breath, decreased exercise tolerance, and extremity edema.    Expected Outcomes  Improve functional capacity of life;Short term: Attendance in program 2-3 days a week with increased exercise capacity. Reported lower sodium intake. Reported increased fruit and vegetable intake. Reports medication compliance.;Short term: Daily weights obtained and reported for increase. Utilizing diuretic protocols set by physician.;Long term: Adoption of self-care skills and reduction of barriers for early signs and symptoms recognition and intervention leading to self-care maintenance.    Hypertension  Yes    Intervention  Provide education on lifestyle modifcations including regular physical activity/exercise, weight management, moderate sodium restriction and increased  consumption of fresh fruit, vegetables, and low fat dairy, alcohol moderation, and smoking cessation.;Monitor prescription use compliance.    Expected Outcomes  Short Term: Continued assessment and intervention until BP is < 140/29mm HG in hypertensive participants. < 130/64mm HG in hypertensive participants with diabetes, heart failure or chronic kidney disease.;Long Term: Maintenance of blood pressure at goal levels.    Lipids  Yes    Intervention  Provide education and support for participant on nutrition & aerobic/resistive exercise along with prescribed medications to achieve LDL 70mg , HDL >40mg .    Expected Outcomes  Short Term: Participant states understanding of desired cholesterol values and is compliant with medications prescribed. Participant is following exercise prescription and nutrition guidelines.;Long Term: Cholesterol controlled with medications as prescribed, with individualized exercise RX and with personalized nutrition plan. Value goals: LDL < 70mg , HDL > 40 mg.       Core Components/Risk Factors/Patient Goals Review:  Goals and Risk Factor Review    Row Name 12/15/17 1440 12/30/17 1053 01/20/18 1220         Core Components/Risk Factors/Patient Goals Review  Personal Goals Review  Weight Management/Obesity;Lipids;Heart Failure;Hypertension;Diabetes  Weight Management/Obesity;Lipids;Heart Failure;Hypertension;Diabetes  Weight Management/Obesity;Lipids;Heart Failure;Hypertension;Diabetes     Review  Pt presents to cardiac rehab willing to participate.  [name] [name] would like increase his strength and stamina.   Pt presents to cardiac rehab willing to participate.  [name] [name] has started exercise and is tolerating it very well.  He enjoys CR.   Pt with multiple RF willing to participate in CR.  [name] [name] continues to tolerate exercise.  He feels that he is gaining strength and enjoys coming to CR.      Expected Outcomes  Pt will continue to participate in CR exercise, nutrition, and lifestyle  modification opportunities.   Pt will continue to participate in CR exercise, nutrition, and lifestyle modification opportunities.   Pt will continue to participate in CR exercise, nutrition, and lifestyle modification opportunities.         Core Components/Risk Factors/Patient Goals at Discharge (Final Review):  Goals and Risk Factor Review - 01/20/18 1220      Core Components/Risk Factors/Patient Goals Review   Personal Goals Review  Weight Management/Obesity;Lipids;Heart Failure;Hypertension;Diabetes    Review  Pt with multiple RF willing to participate in CR.  [name] [name] continues to tolerate exercise.  He feels that he is gaining strength and enjoys coming to CR.     Expected Outcomes  Pt will continue to participate in CR exercise, nutrition, and lifestyle modification opportunities.        ITP Comments: ITP Comments    Row Name 12/09/17 1126 12/15/17 1432 12/30/17 1051 01/20/18 1218     ITP Comments  Dr. Fransico Him, Medical Director   [name] [name] started exercise today and tolerated it well.   30 Day ITP Review.  [name] [name] has started exercise recently and is tolerating is very well.  He enjoys coming to CR and is learning the routine.   30 Day ITP Review.  [name] [name] is tolerating exercise well.  He feels that he is increasing his strength and enjoys coming to CR.        Comments: See ITP Comments.

## 2018-01-20 NOTE — Addendum Note (Signed)
Addended by: Rose Phi on: 01/20/2018 04:55 PM   Modules accepted: Orders

## 2018-01-21 ENCOUNTER — Encounter (HOSPITAL_COMMUNITY)
Admission: RE | Admit: 2018-01-21 | Discharge: 2018-01-21 | Disposition: A | Discharge status: Home / Self Care | Payer: PPO | Source: Ambulatory Visit | Attending: Internal Medicine | Admitting: Internal Medicine

## 2018-01-21 ENCOUNTER — Ambulatory Visit (HOSPITAL_COMMUNITY): Payer: Self-pay | Admitting: Pharmacist

## 2018-01-21 ENCOUNTER — Encounter (HOSPITAL_COMMUNITY): Payer: PPO

## 2018-01-21 DIAGNOSIS — Z95811 Presence of heart assist device: Secondary | ICD-10-CM | POA: Diagnosis not present

## 2018-01-21 DIAGNOSIS — I5022 Chronic systolic (congestive) heart failure: Secondary | ICD-10-CM

## 2018-01-21 DIAGNOSIS — Z7901 Long term (current) use of anticoagulants: Secondary | ICD-10-CM

## 2018-01-21 LAB — POCT INR: INR: 2.1 (ref 2.0–3.0)

## 2018-01-24 ENCOUNTER — Encounter (HOSPITAL_COMMUNITY)
Admission: RE | Admit: 2018-01-24 | Discharge: 2018-01-24 | Disposition: A | Discharge status: Home / Self Care | Payer: PPO | Source: Ambulatory Visit | Attending: Internal Medicine | Admitting: Internal Medicine

## 2018-01-24 ENCOUNTER — Encounter (HOSPITAL_COMMUNITY): Payer: PPO

## 2018-01-24 DIAGNOSIS — Z95811 Presence of heart assist device: Secondary | ICD-10-CM | POA: Diagnosis not present

## 2018-01-24 DIAGNOSIS — I5022 Chronic systolic (congestive) heart failure: Secondary | ICD-10-CM

## 2018-01-26 ENCOUNTER — Encounter (HOSPITAL_COMMUNITY): Payer: PPO

## 2018-01-26 ENCOUNTER — Encounter (HOSPITAL_COMMUNITY)
Admission: RE | Admit: 2018-01-26 | Discharge: 2018-01-26 | Disposition: A | Discharge status: Home / Self Care | Payer: PPO | Source: Ambulatory Visit | Attending: Internal Medicine | Admitting: Internal Medicine

## 2018-01-26 DIAGNOSIS — I5022 Chronic systolic (congestive) heart failure: Secondary | ICD-10-CM

## 2018-01-26 DIAGNOSIS — Z95811 Presence of heart assist device: Secondary | ICD-10-CM

## 2018-01-26 DIAGNOSIS — Z7901 Long term (current) use of anticoagulants: Secondary | ICD-10-CM | POA: Diagnosis not present

## 2018-01-28 ENCOUNTER — Encounter (HOSPITAL_COMMUNITY): Payer: PPO

## 2018-01-31 ENCOUNTER — Encounter (HOSPITAL_COMMUNITY)
Admission: RE | Admit: 2018-01-31 | Discharge: 2018-01-31 | Disposition: A | Discharge status: Home / Self Care | Payer: PPO | Source: Ambulatory Visit | Attending: Internal Medicine | Admitting: Internal Medicine

## 2018-01-31 ENCOUNTER — Ambulatory Visit (HOSPITAL_COMMUNITY): Payer: Self-pay | Admitting: Pharmacist

## 2018-01-31 ENCOUNTER — Encounter (HOSPITAL_COMMUNITY): Payer: PPO

## 2018-01-31 ENCOUNTER — Other Ambulatory Visit: Payer: Self-pay | Admitting: Internal Medicine

## 2018-01-31 DIAGNOSIS — Z7901 Long term (current) use of anticoagulants: Secondary | ICD-10-CM | POA: Insufficient documentation

## 2018-01-31 DIAGNOSIS — Z95811 Presence of heart assist device: Secondary | ICD-10-CM | POA: Insufficient documentation

## 2018-01-31 DIAGNOSIS — Z794 Long term (current) use of insulin: Secondary | ICD-10-CM | POA: Diagnosis not present

## 2018-01-31 DIAGNOSIS — E11319 Type 2 diabetes mellitus with unspecified diabetic retinopathy without macular edema: Secondary | ICD-10-CM | POA: Diagnosis not present

## 2018-01-31 DIAGNOSIS — E1122 Type 2 diabetes mellitus with diabetic chronic kidney disease: Secondary | ICD-10-CM | POA: Insufficient documentation

## 2018-01-31 DIAGNOSIS — I5023 Acute on chronic systolic (congestive) heart failure: Secondary | ICD-10-CM | POA: Insufficient documentation

## 2018-01-31 DIAGNOSIS — Z9889 Other specified postprocedural states: Secondary | ICD-10-CM | POA: Insufficient documentation

## 2018-01-31 DIAGNOSIS — Z79899 Other long term (current) drug therapy: Secondary | ICD-10-CM | POA: Diagnosis not present

## 2018-01-31 DIAGNOSIS — Z7982 Long term (current) use of aspirin: Secondary | ICD-10-CM | POA: Diagnosis not present

## 2018-01-31 DIAGNOSIS — E7800 Pure hypercholesterolemia, unspecified: Secondary | ICD-10-CM | POA: Diagnosis not present

## 2018-01-31 DIAGNOSIS — I5022 Chronic systolic (congestive) heart failure: Secondary | ICD-10-CM

## 2018-01-31 DIAGNOSIS — Z87891 Personal history of nicotine dependence: Secondary | ICD-10-CM | POA: Diagnosis not present

## 2018-01-31 DIAGNOSIS — I130 Hypertensive heart and chronic kidney disease with heart failure and stage 1 through stage 4 chronic kidney disease, or unspecified chronic kidney disease: Secondary | ICD-10-CM | POA: Insufficient documentation

## 2018-01-31 DIAGNOSIS — N189 Chronic kidney disease, unspecified: Secondary | ICD-10-CM | POA: Diagnosis not present

## 2018-01-31 DIAGNOSIS — I255 Ischemic cardiomyopathy: Secondary | ICD-10-CM | POA: Insufficient documentation

## 2018-01-31 LAB — POCT INR: INR: 3.8 — AB (ref 2.0–3.0)

## 2018-02-02 ENCOUNTER — Telehealth (HOSPITAL_COMMUNITY): Payer: Self-pay | Admitting: Surgery

## 2018-02-02 ENCOUNTER — Encounter (HOSPITAL_COMMUNITY): Payer: PPO

## 2018-02-02 ENCOUNTER — Encounter (HOSPITAL_COMMUNITY)
Admission: RE | Admit: 2018-02-02 | Discharge: 2018-02-02 | Disposition: A | Discharge status: Home / Self Care | Payer: PPO | Source: Ambulatory Visit | Attending: Internal Medicine | Admitting: Internal Medicine

## 2018-02-02 DIAGNOSIS — I5022 Chronic systolic (congestive) heart failure: Secondary | ICD-10-CM

## 2018-02-02 DIAGNOSIS — Z95811 Presence of heart assist device: Secondary | ICD-10-CM

## 2018-02-02 NOTE — Telephone Encounter (Signed)
Patient discussed at multidisciplinary HF Paramedicine patient meeting.  He is independently managing his VAD and HF at this point and will be discharged from the HF Paramedicine program.  He is aware and will call the [facility]/ VAD Coordinators if needed.

## 2018-02-04 ENCOUNTER — Encounter (HOSPITAL_COMMUNITY): Payer: PPO

## 2018-02-04 ENCOUNTER — Encounter (HOSPITAL_COMMUNITY)
Admission: RE | Admit: 2018-02-04 | Discharge: 2018-02-04 | Disposition: A | Discharge status: Home / Self Care | Payer: PPO | Source: Ambulatory Visit | Attending: Internal Medicine | Admitting: Internal Medicine

## 2018-02-04 DIAGNOSIS — I5022 Chronic systolic (congestive) heart failure: Secondary | ICD-10-CM

## 2018-02-04 DIAGNOSIS — Z95811 Presence of heart assist device: Secondary | ICD-10-CM | POA: Diagnosis not present

## 2018-02-07 ENCOUNTER — Encounter (HOSPITAL_COMMUNITY)
Admission: RE | Admit: 2018-02-07 | Discharge: 2018-02-07 | Disposition: A | Discharge status: Home / Self Care | Payer: PPO | Source: Ambulatory Visit | Attending: Internal Medicine | Admitting: Internal Medicine

## 2018-02-07 ENCOUNTER — Encounter (HOSPITAL_COMMUNITY): Payer: PPO

## 2018-02-07 DIAGNOSIS — I5022 Chronic systolic (congestive) heart failure: Secondary | ICD-10-CM

## 2018-02-07 DIAGNOSIS — Z95811 Presence of heart assist device: Secondary | ICD-10-CM | POA: Diagnosis not present

## 2018-02-08 ENCOUNTER — Encounter (HOSPITAL_COMMUNITY): Payer: Self-pay | Admitting: Unknown Physician Specialty

## 2018-02-09 ENCOUNTER — Encounter (HOSPITAL_COMMUNITY): Payer: PPO

## 2018-02-09 ENCOUNTER — Encounter (HOSPITAL_COMMUNITY)
Admission: RE | Admit: 2018-02-09 | Discharge: 2018-02-09 | Disposition: A | Discharge status: Home / Self Care | Payer: PPO | Source: Ambulatory Visit | Attending: Internal Medicine | Admitting: Internal Medicine

## 2018-02-09 DIAGNOSIS — Z95811 Presence of heart assist device: Secondary | ICD-10-CM | POA: Diagnosis not present

## 2018-02-09 DIAGNOSIS — I5022 Chronic systolic (congestive) heart failure: Secondary | ICD-10-CM

## 2018-02-10 ENCOUNTER — Ambulatory Visit (INDEPENDENT_AMBULATORY_CARE_PROVIDER_SITE_OTHER): Payer: PPO | Admitting: Nurse Practitioner

## 2018-02-10 ENCOUNTER — Ambulatory Visit (INDEPENDENT_AMBULATORY_CARE_PROVIDER_SITE_OTHER): Payer: PPO

## 2018-02-10 ENCOUNTER — Encounter (HOSPITAL_COMMUNITY): Payer: Self-pay

## 2018-02-10 ENCOUNTER — Telehealth: Payer: Self-pay | Admitting: Cardiology

## 2018-02-10 ENCOUNTER — Ambulatory Visit: Payer: PPO | Admitting: Nurse Practitioner

## 2018-02-10 ENCOUNTER — Encounter: Payer: Self-pay | Admitting: Nurse Practitioner

## 2018-02-10 ENCOUNTER — Ambulatory Visit (HOSPITAL_COMMUNITY): Payer: Self-pay | Admitting: Pharmacist

## 2018-02-10 VITALS — Ht 71.0 in | Wt 211.0 lb

## 2018-02-10 DIAGNOSIS — Z794 Long term (current) use of insulin: Secondary | ICD-10-CM

## 2018-02-10 DIAGNOSIS — Z7901 Long term (current) use of anticoagulants: Secondary | ICD-10-CM

## 2018-02-10 DIAGNOSIS — I255 Ischemic cardiomyopathy: Secondary | ICD-10-CM | POA: Diagnosis not present

## 2018-02-10 DIAGNOSIS — Z23 Encounter for immunization: Secondary | ICD-10-CM

## 2018-02-10 DIAGNOSIS — E1165 Type 2 diabetes mellitus with hyperglycemia: Secondary | ICD-10-CM

## 2018-02-10 LAB — POCT GLYCOSYLATED HEMOGLOBIN (HGB A1C): Hemoglobin A1C: 8.9 % — AB (ref 4.0–5.6)

## 2018-02-10 LAB — POCT INR: INR: 2.2 (ref 2.0–3.0)

## 2018-02-10 NOTE — Patient Instructions (Signed)
I have placed referral to endocrinology to help [place] with your diabetes    Diabetes Mellitus and Nutrition When you have diabetes (diabetes mellitus), it is very important to have healthy eating habits because your blood sugar (glucose) levels are greatly affected by what you eat and drink. Eating healthy foods in the appropriate amounts, at about the same times every day, can help you:  Control your blood glucose.  Lower your risk of heart disease.  Improve your blood pressure.  Reach or maintain a healthy weight.  Every person with diabetes is different, and each person has different needs for a meal plan. Your health care provider may recommend that you work with a diet and nutrition specialist (dietitian) to make a meal plan that is best for you. Your meal plan may vary depending on factors such as:  The calories you need.  The medicines you take.  Your weight.  Your blood glucose, blood pressure, and cholesterol levels.  Your activity level.  Other health conditions you have, such as heart or kidney disease.  How do carbohydrates affect me? Carbohydrates affect your blood glucose level more than any other type of food. Eating carbohydrates naturally increases the amount of glucose in your blood. Carbohydrate counting is a method for keeping track of how many carbohydrates you eat. Counting carbohydrates is important to keep your blood glucose at a healthy level, especially if you use insulin or take certain oral diabetes medicines. It is important to know how many carbohydrates you can safely have in each meal. This is different for every person. Your dietitian can help you calculate how many carbohydrates you should have at each meal and for snack. Foods that contain carbohydrates include:  Bread, cereal, rice, pasta, and crackers.  Potatoes and corn.  Peas, beans, and lentils.  Milk and yogurt.  Fruit and juice.  Desserts, such as cakes, cookies, ice cream, and  candy.  How does alcohol affect me? Alcohol can cause a sudden decrease in blood glucose (hypoglycemia), especially if you use insulin or take certain oral diabetes medicines. Hypoglycemia can be a life-threatening condition. Symptoms of hypoglycemia (sleepiness, dizziness, and confusion) are similar to symptoms of having too much alcohol. If your health care provider says that alcohol is safe for you, follow these guidelines:  Limit alcohol intake to no more than 1 drink per day for nonpregnant women and 2 drinks per day for men. One drink equals 12 oz of beer, 5 oz of wine, or 1 oz of hard liquor.  Do not drink on an empty stomach.  Keep yourself hydrated with water, diet soda, or unsweetened iced tea.  Keep in mind that regular soda, juice, and other mixers may contain a lot of sugar and must be counted as carbohydrates.  What are tips for following this plan? Reading food labels  Start by checking the serving size on the label. The amount of calories, carbohydrates, fats, and other nutrients listed on the label are based on one serving of the food. Many foods contain more than one serving per package.  Check the total grams (g) of carbohydrates in one serving. You can calculate the number of servings of carbohydrates in one serving by dividing the total carbohydrates by 15. For example, if a food has 30 g of total carbohydrates, it would be equal to 2 servings of carbohydrates.  Check the number of grams (g) of saturated and trans fats in one serving. Choose foods that have low or no amount of these  fats.  Check the number of milligrams (mg) of sodium in one serving. Most people should limit total sodium intake to less than 2,300 mg per day.  Always check the nutrition information of foods labeled as "low-fat" or "nonfat". These foods may be higher in added sugar or refined carbohydrates and should be avoided.  Talk to your dietitian to identify your daily goals for nutrients listed  on the label. Shopping  Avoid buying canned, premade, or processed foods. These foods tend to be high in fat, sodium, and added sugar.  Shop around the outside edge of the grocery store. This includes fresh fruits and vegetables, bulk grains, fresh meats, and fresh dairy. Cooking  Use low-heat cooking methods, such as baking, instead of high-heat cooking methods like deep frying.  Cook using healthy oils, such as olive, canola, or sunflower oil.  Avoid cooking with butter, cream, or high-fat meats. Meal planning  Eat meals and snacks regularly, preferably at the same times every day. Avoid going long periods of time without eating.  Eat foods high in fiber, such as fresh fruits, vegetables, beans, and whole grains. Talk to your dietitian about how many servings of carbohydrates you can eat at each meal.  Eat 4-6 ounces of lean protein each day, such as lean meat, chicken, fish, eggs, or tofu. 1 ounce is equal to 1 ounce of meat, chicken, or fish, 1 egg, or 1/4 cup of tofu.  Eat some foods each day that contain healthy fats, such as avocado, nuts, seeds, and fish. Lifestyle   Check your blood glucose regularly.  Exercise at least 30 minutes 5 or more days each week, or as told by your health care provider.  Take medicines as told by your health care provider.  Do not use any products that contain nicotine or tobacco, such as cigarettes and e-cigarettes. If you need help quitting, ask your health care provider.  Work with a Social worker or diabetes educator to identify strategies to manage stress and any emotional and social challenges. What are some questions to ask my health care provider?  Do I need to meet with a diabetes educator?  Do I need to meet with a dietitian?  What number can I call if I have questions?  When are the best times to check my blood glucose? Where to find more information:  American Diabetes Association: diabetes.org/food-and-fitness/food  Academy  of Nutrition and Dietetics: PokerClues.dk  [employer] of Diabetes and Digestive and Kidney Diseases (NIH): ContactWire.be Summary  A healthy meal plan will help you control your blood glucose and maintain a healthy lifestyle.  Working with a diet and nutrition specialist (dietitian) can help you make a meal plan that is best for you.  Keep in mind that carbohydrates and alcohol have immediate effects on your blood glucose levels. It is important to count carbohydrates and to use alcohol carefully. This information is not intended to replace advice given to you by your health care provider. Make sure you discuss any questions you have with your health care provider. Document Released: 11/13/2004 Document Revised: 03/23/2016 Document Reviewed: 03/23/2016 Elsevier Interactive Patient Education  [employer].

## 2018-02-10 NOTE — Telephone Encounter (Signed)
Spoke with pt and reminded pt of remote transmission that is due today. Pt verbalized understanding.   

## 2018-02-10 NOTE — Assessment & Plan Note (Addendum)
Lost of fluctuation in A1c values this year, was actually referred to endocrinology prior but never established care, we discussed referral again today to Endocrinology to manage his diabetes due to difficulty controlling his A1c and mutiple other comorbidities and he is agreeable now Also Discussed the role of healthy diet management of DM and additional information on AVS

## 2018-02-10 NOTE — Progress Notes (Signed)
[name] [name] is a 67 y.o. male with the following history as recorded in EpicCare:  Patient Active Problem List   Diagnosis Date Noted  . Peripheral neuropathy 11/11/2017  . Long term (current) use of anticoagulants 09/29/2017  . Decubitus ulcer of sacral region, stage 2 ([place]) 09/24/2017  . Irregular prostate   . Sleep disturbance   . Dysphagia   . Diabetes mellitus type 2 in nonobese (HCC)   . Labile blood glucose   . Debility 09/10/2017  . Type 2 diabetes mellitus with retinopathy, with long-term current use of insulin ([place])   . Stage 3 chronic kidney disease ([place])   . Leukocytosis   . Pressure injury of skin 08/27/2017  . Metabolic alkalosis   . Presence of left ventricular assist device (LVAD) ([place])   . S/P TVR (tricuspid valve repair) 08/17/2017  . Goals of care, counseling/discussion   . Palliative care encounter   . Hx of atrial flutter 11/18/2016  . CAD (coronary artery disease)-RCA-T, 70% PL (off CFX), 99% Prox LAD/90% Dist LAD, S/P TAXUS stent x 2 11/18/2016  . HTN (hypertension) 11/18/2016  . Diabetes mellitus with complication ([place]) 29/93/7169  . Bilateral lower extremity edema 11/12/2016  . Diabetic polyneuropathy associated with diabetes mellitus due to underlying condition ([place]) 05/29/2015  . Chronic systolic CHF (congestive heart failure) ([place]) 02/05/2014  . Medtronic biventricular ICD, serial number  BLD [id number]  02/05/2014  . Chronic renal disease, stage III ([place]) 01/08/2014  . Mitral regurgitation 04/17/2013  . Atrial flutter ([place]) 06/03/2012  . Chronic anticoagulation   . Nausea with vomiting 05/18/2012  . PAF (paroxysmal atrial fibrillation) ([place]) 05/17/2012  . Orthopnea 05/17/2012  . TIA (transient ischemic attack) 05/29/2010  . CHEST PAIN 11/13/2008  . VENTRICULAR TACHYCARDIA 08/14/2008  . Automatic implantable cardioverter-defibrillator in situ 08/14/2008  . Type 2 diabetes with nephropathy ([place]) 08/11/2008  . DYSLIPIDEMIA 08/11/2008  .  Cardiomyopathy, ischemic 08/11/2008  . Disorder resulting from impaired renal function 08/11/2008  . HYPERCHOLESTEROLEMIA 04/29/2006  . MYOCARDIAL INFARCTION, OLD 04/29/2006  . NEPHROLITHIASIS 04/29/2006    Current Outpatient Medications  Medication Sig Dispense Refill  . amiodarone (PACERONE) 200 MG tablet Take one tablet in the AM.  Take 1/2 tablet in the PM 150 tablet 3  . ascorbic acid (VITAMIN C) 500 MG tablet Take 1 tablet (500 mg total) by mouth 2 (two) times daily. 100 tablet 0  . aspirin 81 MG chewable tablet Chew 1 tablet (81 mg total) by mouth daily.    Marland Kitchen gabapentin (NEURONTIN) 100 MG capsule Take 2 in the morning 1 at lunch and 3 at bedtime. 540 capsule 3  . Insulin Pen Needle (COMFORT EZ PEN NEEDLES) 32G X 6 MM MISC 1 application by Does not apply route at bedtime. 100 each 0  . magnesium oxide (MAG-OX) 400 (241.3 Mg) MG tablet Take 1 tablet (400 mg total) by mouth 2 (two) times daily. 60 tablet 0  . Multiple Vitamin (MULTIVITAMIN WITH MINERALS) TABS tablet Take 1 tablet by mouth daily.    . potassium chloride (K-DUR) 10 MEQ tablet Take 2 tablets (20 mEq total) by mouth every Monday, Wednesday, and Friday. 90 tablet 3  . protein supplement shake (PREMIER PROTEIN) LIQD Take 325 mLs (11 oz total) by mouth 2 (two) times daily between meals. (Patient taking differently: Take 11 oz by mouth 2 (two) times daily between meals. 3x a week)  0  . spironolactone (ALDACTONE) 25 MG tablet Take 1 tablet (25 mg total) by mouth daily. [place]  tablet 11  . torsemide (DEMADEX) 20 MG tablet Take 2 tablets (40 mg total) by mouth every Monday, Wednesday, and Friday. 90 tablet 0  . warfarin (COUMADIN) 2.5 MG tablet Take 2.5 mg by mouth daily.      No current facility-administered medications for this visit.     Allergies: Penicillins  Past Medical History:  Diagnosis Date  . AICD (automatic cardioverter/defibrillator) present 02/05/2014   Upgrade to Medtronic biventricular ICD, serial number  BLD  [id number]   . Atrial flutter ([place]) 04/2012   s/p TEE-EPS+RFCA 04/2012  . CAD (coronary artery disease) 8242,3536 X 2    RCA-T, 70% PL (off CFX), 99% Prox LAD/90% Dist LAD, S/P TAXUS stent x 2  . CHF (congestive heart failure) ([place])   . Chronic anticoagulation   . Chronic systolic heart failure ([place])   . CKD (chronic kidney disease)   . Diabetic retinopathy ([place])   . DM type 2 (diabetes mellitus, type 2) (HCC)    insulin dependent  . HTN (hypertension)   . Hypercholesteremia    ablation  . ICD (implantable cardiac defibrillator) in place   . Ischemic cardiomyopathy March 2015   20-25% 2D   . Nephrolithiasis   . Ventricular tachycardia [facility])     Past Surgical History:  Procedure Laterality Date  . ATRIAL FLUTTER ABLATION N/A 05/19/2012   Procedure: ATRIAL FLUTTER ABLATION;  Surgeon: Thompson Grayer, MD;  Location[facility] C[facility] LAB;  Service: Cardiovascular;  Laterality: N/A;  . BI-VENTRICULAR IMPLANTABLE CARDIOVERTER DEFIBRILLATOR UPGRADE N/A 02/05/2014   Procedure: BI-VENTRICULAR IMPLANTABLE CARDIOVERTER DEFIBRILLATOR UPGRADE;  Surgeon: Evans Lance, MD;  Location: [facility] CATH LAB;  Service: Cardiovascular;  Laterality: N/A;  . BIV ICD GENERTAOR CHANGE OUT  02/05/2014   Upgrade to Medtronic biventricular ICD, serial number  [id number] by Dr. [name]  . CARDIAC DEFIBRILLATOR PLACEMENT  2007    Medtronic Maximo VR, serial number [id number]  . INSERTION OF IMPLANTABLE LEFT VENTRICULAR ASSIST DEVICE N/A 08/16/2017   Procedure: INSERTION OF IMPLANTABLE LEFT VENTRICULAR ASSIST DEVICE-HM3;  Surgeon: Ivin Poot, MD;  Location: [place];  Service: Open Heart Surgery;  Laterality: N/A;  . IR FLUORO GUIDE CV LINE RIGHT  08/12/2017  . IR US GUIDE VASC ACCESS RIGHT  08/12/2017  . MEDIASTINAL EXPLORATION  08/17/2017   Procedure: MEDIASTINAL REXPLORATION with evacuation of hematoma;  Surgeon: Prescott Gum, Collier Salina, MD;  Location: [facility] OR;  Service: Open Heart Surgery;;  . PERCUTANEOUS CORONARY STENT INTERVENTION  (PCI-S)  January 2002   PTCA/Stent Distal RCA  . PERCUTANEOUS CORONARY STENT INTERVENTION (PCI-S)  June 2002   PTCA/Stent x 3 RCA, thrombolysis - failed  . PERCUTANEOUS CORONARY STENT INTERVENTION (PCI-S)  July 2006   TAXUS stents to prox and distal LAD  . RIGHT HEART CATH N/A 07/22/2017   Procedure: RIGHT HEART CATH;  Surgeon: Jolaine Artist, MD;  Location: [place] CV LAB;  Service: Cardiovascular;  Laterality: N/A;  . RIGHT HEART CATH N/A 08/13/2017   Procedure: RIGHT HEART CATH - swan;  Surgeon: Jolaine Artist, MD;  Location: [place] CV LAB;  Service: Cardiovascular;  Laterality: N/A;  . RIGHT/LEFT HEART CATH AND CORONARY ANGIOGRAPHY N/A 02/18/2017   Procedure: RIGHT/LEFT HEART CATH AND CORONARY ANGIOGRAPHY;  Surgeon: Jolaine Artist, MD;  Location: [place] CV LAB;  Service: Cardiovascular;  Laterality: N/A;  . STERNAL CLOSURE N/A 08/19/2017   Procedure: STERNAL CLOSURE;  Surgeon: Ivin Poot, MD;  Location: [place];  Service: Thoracic;  Laterality: N/A;  . TEE  WITHOUT CARDIOVERSION N/A 08/16/2017   Procedure: TRANSESOPHAGEAL ECHOCARDIOGRAM (TEE);  Surgeon: Prescott Gum, Collier Salina, MD;  Location: [place];  Service: Open Heart Surgery;  Laterality: N/A;  . TEE WITHOUT CARDIOVERSION N/A 08/19/2017   Procedure: TRANSESOPHAGEAL ECHOCARDIOGRAM (TEE);  Surgeon: Prescott Gum, Collier Salina, MD;  Location: [place];  Service: Thoracic;  Laterality: N/A;  . TRICUSPID VALVE REPLACEMENT N/A 08/16/2017   Procedure: TRICUSPID VALVE REPAIR using Oletta Lamas MC3 Ring size 30;  Surgeon: Ivin Poot, MD;  Location: [place];  Service: Open Heart Surgery;  Laterality: N/A;    Family History  Problem Relation Age of Onset  . Heart failure Father        Deceased  . Alzheimer's disease Mother        Living  . Heart attack Mother   . CAD Brother   . Other Brother        blood cancer  . CAD Brother   . Prostate cancer Brother   . CAD Brother   . CAD Brother   . Healthy Son   . Healthy Daughter   .  Diabetes Brother   . Stomach cancer Neg Hx   . Colon cancer Neg Hx     Social History   Tobacco Use  . Smoking status: Former Smoker    Packs/day: 1.00    Years: 29.00    Pack years: 29.00    Types: Cigarettes    Last attempt to quit: 03/02/2000    Years since quitting: 17.9  . Smokeless tobacco: Never Used  Substance Use Topics  . Alcohol use: No    Alcohol/week: 0.0 standard drinks     Subjective:  [name] [name] is here today for diabetes follow up, maintained on novolin 70/30 - 30 units daily- taking daily as prescribed, recent home glucose readings 120s-200. Tries to watch his diet. No syncope, confusion, hypoglycemia,  polyuria, polydipsia, polyphagia. Last A1c 6.3 on 08/14/17, says he has not really changed his diet much since then  Lab Results  Component Value Date   HGBA1C 8.9 (A) 02/10/2018     ROS- See HPI  Objective:  Vitals:   02/10/18 1339  Weight: 211 lb (95.7 kg)  Height: 5\' 11"  (1.803 m)    General: Well developed, well nourished, in no acute distress  Skin : Warm and dry.  Head: Normocephalic and atraumatic  Eyes: Sclera and conjunctiva clear; pupils round and reactive to light; extraocular movements intact  Oropharynx: Pink, supple. No suspicious lesions  Neck: Supple  Lungs: Respirations unlabored  CVS exam: LVAD with mechanical heart sounds.  Extremities: No edema, cyanosis   Vessels: Symmetric bilaterally  Neurologic: Alert and oriented; speech intact; face symmetrical; moves all extremities well; CNII-XII intact without focal deficit  Psychiatric: Normal mood and affect.   Assessment:  1. Uncontrolled type 2 diabetes mellitus with hyperglycemia, with long-term current use of insulin ([place])   2. Need for influenza vaccination     Plan:   Reviewed Health Maintenance: Declines colonoscopy, reports cologuard ordered by his VAD provider, Dr [name], but he has not completed yet Referral to endocrinology for diabetes care today Ophthalmology  exam done 12/2017 by Dr Katy Fitch, records requested to update his record here Need for influenza vaccination- Flu vaccine HIGH DOSE PF Need for pneumococcal vaccination- Pneumococcal conjugate vaccine 13-valent IM   No follow-ups on file.  Orders Placed This Encounter  Procedures  . Flu vaccine HIGH DOSE PF  . Ambulatory referral to Endocrinology    Referral Priority:  Routine    Referral Type:   Consultation    Referral Reason:   Specialty Services Required    Number of Visits Requested:   1  . POCT HgB A1C    Requested Prescriptions    No prescriptions requested or ordered in this encounter

## 2018-02-10 NOTE — Progress Notes (Signed)
Cardiac Individual Treatment Plan  Patient Details  Name: [name] [name] MRN: [id number] Date of Birth: Feb 27, 1951 Referring Provider:     CARDIAC REHAB PHASE II ORIENTATION from 12/09/2017 in [place]  Referring Provider  Glori Bickers MD      Initial Encounter Date:    CARDIAC REHAB PHASE II ORIENTATION from 12/09/2017 in [place]  Date  12/09/17      Visit Diagnosis: Heart failure, chronic systolic ([place])  Left ventricular assist device present ([place])  Patient's Home Medications on Admission:  Current Outpatient Medications:  .  amiodarone (PACERONE) 200 MG tablet, Take one tablet in the AM.  Take 1/2 tablet in the PM, Disp: 150 tablet, Rfl: 3 .  ascorbic acid (VITAMIN C) 500 MG tablet, Take 1 tablet (500 mg total) by mouth 2 (two) times daily., Disp: 100 tablet, Rfl: 0 .  aspirin 81 MG chewable tablet, Chew 1 tablet (81 mg total) by mouth daily., Disp: , Rfl:  .  gabapentin (NEURONTIN) 100 MG capsule, Take 2 in the morning 1 at lunch and 3 at bedtime., Disp: 540 capsule, Rfl: 3 .  Insulin Pen Needle (COMFORT EZ PEN NEEDLES) 32G X 6 MM MISC, 1 application by Does not apply route at bedtime., Disp: 100 each, Rfl: 0 .  magnesium oxide (MAG-OX) 400 (241.3 Mg) MG tablet, Take 1 tablet (400 mg total) by mouth 2 (two) times daily., Disp: 60 tablet, Rfl: 0 .  Multiple Vitamin (MULTIVITAMIN WITH MINERALS) TABS tablet, Take 1 tablet by mouth daily., Disp: , Rfl:  .  potassium chloride (K-DUR) 10 MEQ tablet, Take 2 tablets (20 mEq total) by mouth every Monday, Wednesday, and Friday., Disp: 90 tablet, Rfl: 3 .  protein supplement shake (PREMIER PROTEIN) LIQD, Take 325 mLs (11 oz total) by mouth 2 (two) times daily between meals. (Patient taking differently: Take 11 oz by mouth 2 (two) times daily between meals. 3x a week), Disp: , Rfl: 0 .  spironolactone (ALDACTONE) 25 MG tablet, Take 1 tablet (25 mg total) by mouth  daily., Disp: 30 tablet, Rfl: 11 .  torsemide (DEMADEX) 20 MG tablet, Take 2 tablets (40 mg total) by mouth every Monday, Wednesday, and Friday., Disp: 90 tablet, Rfl: 0 .  warfarin (COUMADIN) 2.5 MG tablet, Take 2.5 mg by mouth daily. , Disp: , Rfl:   Past Medical History: Past Medical History:  Diagnosis Date  . AICD (automatic cardioverter/defibrillator) present 02/05/2014   Upgrade to Medtronic biventricular ICD, serial number  [id number]   . Atrial flutter ([place]) 04/2012   s/p TEE-EPS+RFCA 04/2012  . CAD (coronary artery disease) 2297,9892 X 2    RCA-T, 70% PL (off CFX), 99% Prox LAD/90% Dist LAD, S/P TAXUS stent x 2  . CHF (congestive heart failure) ([place])   . Chronic anticoagulation   . Chronic systolic heart failure ([place])   . CKD (chronic kidney disease)   . Diabetic retinopathy ([place])   . DM type 2 (diabetes mellitus, type 2) ([facility])    insulin dependent  . HTN (hypertension)   . Hypercholesteremia    ablation  . ICD (implantable cardiac defibrillator) in place   . Ischemic cardiomyopathy March 2015   20-25% 2D   . Nephrolithiasis   . Ventricular tachycardia ([facility])     Tobacco Use: Social History   Tobacco Use  Smoking Status Former Smoker  . Packs/day: 1.00  . Years: 29.00  . Pack years: 29.00  . Types: Cigarettes  .  Last attempt to quit: 03/02/2000  . Years since quitting: 17.9  Smokeless Tobacco Never Used    Labs: Recent Chemical engineer    Labs for ITP Cardiac and Pulmonary Rehab Latest Ref Rng & Units 09/07/2017 09/08/2017 09/09/2017 11/11/2017 02/10/2018   Cholestrol <200 mg/dL - - - - -   LDLCALC <100 mg/dL - - - - -   LDLDIRECT mg/dL - - - - -   HDL >40 mg/dL - - - - -   Trlycerides <150 mg/dL - - - - -   Hemoglobin A1c 4.0 - 5.6 % - - - 6.3(A) 8.9(A)   PHART 7.350 - 7.450 - - - - -   PCO2ART 32.0 - 48.0 mmHg - - - - -   HCO3 20.0 - 28.0 mmol/L - - - - -   TCO2 22 - 32 mmol/L - - - - -   ACIDBASEDEF 0.0 - 2.0 mmol/L - - - - -   O2SAT % 54.4 60.9  52.1 - -      Capillary Blood Glucose: Lab Results  Component Value Date   GLUCAP 279 (H) 01/10/2018   GLUCAP 121 (H) 12/27/2017   GLUCAP 227 (H) 12/24/2017   GLUCAP 237 (H) 12/20/2017   GLUCAP 184 (H) 12/15/2017     Exercise Target Goals: Exercise Program Goal: Individual exercise prescription set using results from initial 6 min walk test and THRR while considering  patient's activity barriers and safety.   Exercise Prescription Goal: Initial exercise prescription builds to 30-45 minutes a day of aerobic activity, 2-3 days per week.  Home exercise guidelines will be given to patient during program as part of exercise prescription that the participant will acknowledge.  Activity Barriers & Risk Stratification: Activity Barriers & Cardiac Risk Stratification - 12/09/17 1131      Activity Barriers & Cardiac Risk Stratification   Activity Barriers  Deconditioning;Muscular Weakness    Cardiac Risk Stratification  High       6 Minute Walk: 6 Minute Walk    Row Name 12/09/17 1007 12/09/17 1127       6 Minute Walk   Phase  Initial  Initial    Distance  -  685 feet    Walk Time  -  4.15 minutes    # of Rest Breaks  -  1    MPH  -  1.29    METS  -  1.34    RPE  -  12    Perceived Dyspnea   -  0    VO2 Peak  -  4.7    Symptoms  -  Yes (comment)    Comments  -  Leg Fatigue     Resting HR  -  89 bpm    Resting BP  -  - MAP: 84    Resting Oxygen Saturation   -  96 %    Max Ex. HR  -  103 bpm    Max Ex. BP  -  - MAP: 88    2 Minute Post BP  -  - MAP: 78       Oxygen Initial Assessment:   Oxygen Re-Evaluation:   Oxygen Discharge (Final Oxygen Re-Evaluation):   Initial Exercise Prescription: Initial Exercise Prescription - 12/09/17 1100      Date of Initial Exercise RX and Referring Provider   Date  12/09/17    Referring Provider  Glori Bickers MD    Expected Discharge Date  03/18/18  NuStep   Level  1    SPM  75    Minutes  10    METs  1.5       Arm Ergometer   Level  1    Watts  10    Minutes  10    METs  1.5      Track   Laps  5    Minutes  10    METs  1.84      Prescription Details   Frequency (times per week)  3    Duration  Progress to 30 minutes of continuous aerobic without signs/symptoms of physical distress      Intensity   THRR 40-80% of Max Heartrate  61-122    Ratings of Perceived Exertion  11-13    Perceived Dyspnea  0-4      Progression   Progression  Continue progressive overload as per policy without signs/symptoms or physical distress.      Resistance Training   Training Prescription  Yes    Weight  2    Reps  10-15       Perform Capillary Blood Glucose checks as needed.  Exercise Prescription Changes: Exercise Prescription Changes    Row Name 12/15/17 1300 12/29/17 1000 01/10/18 1400 01/19/18 1143 01/26/18 1028     Response to Exercise   Blood Pressure (Admit)  - Doppler:84  - MAP: 80  - MAP: 78  - MAP: 72  - MAP: 84   Blood Pressure (Exercise)  - Doppler:86  - MAP: 84  - MAP: 84  - MAP: 78  - MAP: 84   Blood Pressure (Exit)  - Doppler: 72  - MAP: 80  - MAP:92  - MAP: 82  - MAP: 80   Heart Rate (Admit)  76 bpm  68 bpm  68 bpm  60 bpm  70 bpm   Heart Rate (Exercise)  86 bpm  81 bpm  85 bpm  78 bpm  73 bpm   Heart Rate (Exit)  76 bpm  68 bpm  73 bpm  58 bpm  69 bpm   Rating of Perceived Exertion (Exercise)  _0 Perceived Dyspnea (Exercise)  0  0  0  0  0   Symptoms  None  None  None  None  None   Comments  Pt oriented to exercise equipment   None  None  None  None   Duration  Progress to 30 minutes of  aerobic without signs/symptoms of physical distress  Progress to 30 minutes of  aerobic without signs/symptoms of physical distress  Progress to 30 minutes of  aerobic without signs/symptoms of physical distress  Continue with 30 min of aerobic exercise without signs/symptoms of physical distress.  Continue with 30 min of aerobic exercise without signs/symptoms of physical  distress.   Intensity  THRR unchanged  THRR unchanged  THRR unchanged  THRR unchanged  THRR unchanged     Progression   Progression  Continue to progress workloads to maintain intensity without signs/symptoms of physical distress.  Continue to progress workloads to maintain intensity without signs/symptoms of physical distress.  Continue to progress workloads to maintain intensity without signs/symptoms of physical distress.  Continue to progress workloads to maintain intensity without signs/symptoms of physical distress.  Continue to progress workloads to maintain intensity without signs/symptoms of physical distress.   Average METs  1.65  1.79  2.09  2.02  2.02  Resistance Training   Training Prescription  No  Yes  Yes  No  No   Weight  -  2lbs  3lbs  -  -   Reps  -  10-15  10-15  -  -   Time  -  10 Minutes  10 Minutes  -  -     Interval Training   Interval Training  No  -  No  -  -     NuStep   Level  _0 SPM  75  75  85  85  85   Minutes  _1 METs  1.6  1.9  1.9  1.7  1.9     Arm Ergometer   Level  _2 Watts  _3 Minutes  _4 METs  1.5  1.6  1.6  1.8  1.6     Track   Laps  _5 Minutes  _6 METs  1.7  1.84  2.53  2.53  2.57     Home Exercise Plan   Plans to continue exercise at  -  -  -  Home (comment) Walking  Home (comment) Walking   Frequency  -  -  -  Add 2 additional days to program exercise sessions.  Add 2 additional days to program exercise sessions.   Initial Home Exercises Provided  -  -  -  01/14/18  01/14/18   Row Name 01/31/18 1326 02/07/18 1600           Response to Exercise   Blood Pressure (Admit)  - MAP: 86  - MAP: 86      Blood Pressure (Exercise)  - MAP: 92  - MAP: 82      Blood Pressure (Exit)  - MAP: 92  - MAP: 82      Heart Rate (Admit)  94 bpm  79 bpm      Heart Rate (Exercise)  993 bpm  77 bpm      Heart Rate (Exit)  77 bpm  67 bpm       Rating of Perceived Exertion (Exercise)  13  13      Perceived Dyspnea (Exercise)  0  0      Symptoms  None  None      Comments  None  None      Duration  Continue with 30 min of aerobic exercise without signs/symptoms of physical distress.  Progress to 30 minutes of  aerobic without signs/symptoms of physical distress      Intensity  THRR unchanged  THRR unchanged        Progression   Progression  Continue to progress workloads to maintain intensity without signs/symptoms of physical distress.  Continue to progress workloads to maintain intensity without signs/symptoms of physical distress.      Average METs  2.2  2.12        Resistance Training   Training Prescription  No  Yes      Weight  -  4lbs      Reps  -  10-15      Time  -  10 Minutes        Interval Training   Interval Training  -  No        NuStep   Level  3  3      SPM  85  95      Minutes  10  10      METs  1.9  1.8        Arm Ergometer   Level  1  1      Watts  12  12      Minutes  10  10      METs  2  2        Track   Laps  9  9      Minutes  10  10      METs  2.57  2.57        Home Exercise Plan   Plans to continue exercise at  Home (comment) Walking  Home (comment) Walking      Frequency  Add 2 additional days to program exercise sessions.  Add 2 additional days to program exercise sessions.      Initial Home Exercises Provided  01/14/18  01/14/18         Exercise Comments: Exercise Comments    Row Name 12/15/17 1007 01/14/18 1354 02/10/18 1334       Exercise Comments  Pt's first day of exericse. Pt responded well to exercise prescription. Will continue to monitor and progress pt.   Reviewed HEP with pt. Pt verbalized understanding and is currently walking for exericse. Will continue to follow up with pt regarding home exercise.   Reviewed METs and Goals with pt. Pt is not progressing with work levels, will continue to work with pt to improve cardiovascular strength.         Exercise Goals and  Review: Exercise Goals    Row Name 12/09/17 1131             Exercise Goals   Increase Physical Activity  Yes       Intervention  Provide advice, education, support and counseling about physical activity/exercise needs.;Develop an individualized exercise prescription for aerobic and resistive training based on initial evaluation findings, risk stratification, comorbidities and participant's personal goals.       Expected Outcomes  Short Term: Attend rehab on a regular basis to increase amount of physical activity.;Long Term: Add in home exercise to make exercise part of routine and to increase amount of physical activity.;Long Term: Exercising regularly at least 3-5 days a week.       Increase Strength and Stamina  Yes       Intervention  Provide advice, education, support and counseling about physical activity/exercise needs.;Develop an individualized exercise prescription for aerobic and resistive training based on initial evaluation findings, risk stratification, comorbidities and participant's personal goals.       Expected Outcomes  Short Term: Increase workloads from initial exercise prescription for resistance, speed, and METs.;Short Term: Perform resistance training exercises routinely during rehab and add in resistance training at home;Long Term: Improve cardiorespiratory fitness, muscular endurance and strength as measured by increased METs and functional capacity (6MWT)       Able to understand and use rate of perceived exertion (RPE) scale  Yes       Intervention  Provide education and explanation on how to use RPE scale       Expected Outcomes  Short Term: Able to use RPE daily in rehab to  express subjective intensity level;Long Term:  Able to use RPE to guide intensity level when exercising independently       Knowledge and understanding of Target Heart Rate Range (THRR)  Yes       Intervention  Provide education and explanation of THRR including how the numbers were predicted and  where they are located for reference       Expected Outcomes  Short Term: Able to state/look up THRR;Short Term: Able to use daily as guideline for intensity in rehab;Long Term: Able to use THRR to govern intensity when exercising independently       Able to check pulse independently  Yes       Intervention  Provide education and demonstration on how to check pulse in carotid and radial arteries.;Review the importance of being able to check your own pulse for safety during independent exercise       Expected Outcomes  Short Term: Able to explain why pulse checking is important during independent exercise;Long Term: Able to check pulse independently and accurately       Understanding of Exercise Prescription  Yes       Intervention  Provide education, explanation, and written materials on patient's individual exercise prescription       Expected Outcomes  Short Term: Able to explain program exercise prescription;Long Term: Able to explain home exercise prescription to exercise independently          Exercise Goals Re-Evaluation : Exercise Goals Re-Evaluation    Row Name 01/14/18 1355 02/10/18 1338           Exercise Goal Re-Evaluation   Exercise Goals Review  Increase Physical Activity;Able to understand and use rate of perceived exertion (RPE) scale;Knowledge and understanding of Target Heart Rate Range (THRR);Understanding of Exercise Prescription;Increase Strength and Stamina;Able to check pulse independently  Increase Physical Activity;Understanding of Exercise Prescription      Comments  Reviewed HEP with pt. Also reviewed THRR, RPE Scale, weather conditions, endpoints of exercise, NTG use, when to call Dr/911, warmup and cool down.   Reviewed METs and Goals. Pt is putting forth effort with exercise. Unable to increase pt's levels due to pt tolerance to increases. Will continue to work with pt to slowly progress pt.      Expected Outcomes  Pt will continue to walk 5-7 days a week for 20  minutes. Pt will continue to increase cardiovascular strength. Pt states he is very grateful for rehab. Will continue to monitor.   Pt will continue to walk for exercise. Pt will continue to improve stamina. Will progress pt as tolerated.          Discharge Exercise Prescription (Final Exercise Prescription Changes): Exercise Prescription Changes - 02/07/18 1600      Response to Exercise   Blood Pressure (Admit)  --   MAP: 86   Blood Pressure (Exercise)  --   MAP: 82   Blood Pressure (Exit)  --   MAP: 82   Heart Rate (Admit)  79 bpm    Heart Rate (Exercise)  77 bpm    Heart Rate (Exit)  67 bpm    Rating of Perceived Exertion (Exercise)  13    Perceived Dyspnea (Exercise)  0    Symptoms  None    Comments  None    Duration  Progress to 30 minutes of  aerobic without signs/symptoms of physical distress    Intensity  THRR unchanged      Progression   Progression  Continue to  progress workloads to maintain intensity without signs/symptoms of physical distress.    Average METs  2.12      Resistance Training   Training Prescription  Yes    Weight  4lbs    Reps  10-15    Time  10 Minutes      Interval Training   Interval Training  No      NuStep   Level  3    SPM  95    Minutes  10    METs  1.8      Arm Ergometer   Level  1    Watts  12    Minutes  10    METs  2      Track   Laps  9    Minutes  10    METs  2.57      Home Exercise Plan   Plans to continue exercise at  Home (comment)   Walking   Frequency  Add 2 additional days to program exercise sessions.    Initial Home Exercises Provided  01/14/18       Nutrition:  Target Goals: Understanding of nutrition guidelines, daily intake of sodium <1514m, cholesterol <2039m calories 30% from fat and 7% or less from saturated fats, daily to have 5 or more servings of fruits and vegetables.  Biometrics: Pre Biometrics - 12/09/17 1130      Pre Biometrics   Height  5' 10" (1.778 m)    Weight  106.9 kg    Waist  Circumference  44 inches    Hip Circumference  44.5 inches    Waist to Hip Ratio  0.99 %    BMI (Calculated)  33.82    Triceps Skinfold  24 mm    % Body Fat  33.8 %    Grip Strength  33 kg    Flexibility  11 in    Single Leg Stand  4.77 seconds        Nutrition Therapy Plan and Nutrition Goals:   Nutrition Assessments:   Nutrition Goals Re-Evaluation:   Nutrition Goals Re-Evaluation:   Nutrition Goals Discharge (Final Nutrition Goals Re-Evaluation):   Psychosocial: Target Goals: Acknowledge presence or absence of significant depression and/or stress, maximize coping skills, provide positive support system. Participant is able to verbalize types and ability to use techniques and skills needed for reducing stress and depression.  Initial Review & Psychosocial Screening: Initial Psych Review & Screening - 12/09/17 1213      Initial Review   Current issues with  Current Stress Concerns    Source of Stress Concerns  Chronic Illness      Family Dynamics   Good Support System?  Yes   Pt lists his wife [name] [name] a source of support. She is present with [name] [name] the orientation appointment.      Barriers   Psychosocial barriers to participate in program  There are no identifiable barriers or psychosocial needs.      Screening Interventions   Interventions  Encouraged to exercise    Expected Outcomes  Short Term goal: Identification and review with participant of any Quality of Life or Depression concerns found by scoring the questionnaire.;Long Term goal: The participant improves quality of Life and PHQ9 Scores as seen by post scores and/or verbalization of changes       Quality of Life Scores: Quality of Life - 12/09/17 1141      Quality of Life   Select  Quality of Life  Quality of Life Scores   Health/Function Pre  17.71 %    Socioeconomic Pre  20.1 %    Psych/Spiritual Pre  22 %    Family Pre  22.8 %    GLOBAL Pre  19.89 %      Scores of 19 and below  usually indicate a poorer quality of life in these areas.  A difference of  2-3 points is a clinically meaningful difference.  A difference of 2-3 points in the total score of the Quality of Life Index has been associated with significant improvement in overall quality of life, self-image, physical symptoms, and general health in studies assessing change in quality of life.  PHQ-9: Recent Review Flowsheet Data    Depression screen [facility] 2/9 12/15/2017 10/08/2017 09/29/2017   Decreased Interest 0 0 0   Down, Depressed, Hopeless 0 0 0   PHQ - 2 Score 0 0 0     Interpretation of Total Score  Total Score Depression Severity:  1-4 = Minimal depression, 5-9 = Mild depression, 10-14 = Moderate depression, 15-19 = Moderately severe depression, 20-27 = Severe depression   Psychosocial Evaluation and Intervention: Psychosocial Evaluation - 12/15/17 1436      Psychosocial Evaluation & Interventions   Interventions  Encouraged to exercise with the program and follow exercise prescription;Stress management education;Relaxation education    Comments  [name] [name] has stress concerns related to his chronic illness and depending on others.  He is adjusting to his decrease in independence as he progresses.  He maintains a positive outlook and relies on his faith.     Expected Outcomes  [name] [name] will continue to maintain a positive outlook and utilize his faith when in need.    Continue Psychosocial Services   No Follow up required       Psychosocial Re-Evaluation: Psychosocial Re-Evaluation    [place] Name 12/30/17 1052 01/20/18 1220 02/10/18 1501         Psychosocial Re-Evaluation   Current issues with  Current Stress Concerns  Current Stress Concerns  Current Stress Concerns     Comments  [name] [name] has stress concerns related to his chronic illness but is coping well.  No current psychosocial needs identified.   [name] [name] has stress concerns related to his chronic illness but is coping well.  No current psychosocial needs  identified.   [name] [name] does not report any current stress concerns.  No interventions necessary.      Expected Outcomes  [name] [name] will continue to exhibit a positive outlook with good coping skills.  [name] [name] will continue to exhibit a positive outlook with good coping skills.  [name] [name] will continue to exhibit a positive outlook with good coping skills.     Interventions  Relaxation education;Stress management education;Encouraged to attend Cardiac Rehabilitation for the exercise  Relaxation education;Stress management education;Encouraged to attend Cardiac Rehabilitation for the exercise  Relaxation education;Stress management education;Encouraged to attend Cardiac Rehabilitation for the exercise     Continue Psychosocial Services   Follow up required by staff  Follow up required by staff  No Follow up required        Psychosocial Discharge (Final Psychosocial Re-Evaluation): Psychosocial Re-Evaluation - 02/10/18 1501      Psychosocial Re-Evaluation   Current issues with  Current Stress Concerns    Comments  [name] [name] does not report any current stress concerns.  No interventions necessary.     Expected Outcomes  [name] [name] will continue to exhibit a positive outlook with good coping skills.    Interventions  Relaxation  education;Stress management education;Encouraged to attend Cardiac Rehabilitation for the exercise    Continue Psychosocial Services   No Follow up required       Vocational Rehabilitation: Provide vocational rehab assistance to qualifying candidates.   Vocational Rehab Evaluation & Intervention: Vocational Rehab - 12/09/17 1211      Initial Vocational Rehab Evaluation & Intervention   Assessment shows need for Vocational Rehabilitation  No       Education: Education Goals: Education classes will be provided on a weekly basis, covering required topics. Participant will state understanding/return demonstration of topics presented.  Learning Barriers/Preferences: Learning Barriers/Preferences  - 12/09/17 1142      Learning Barriers/Preferences   Learning Barriers  Sight    Learning Preferences  Written Material;Individual Instruction       Education Topics: Count Your Pulse:  -Group instruction provided by verbal instruction, demonstration, patient participation and written materials to support subject.  Instructors address importance of being able to find your pulse and how to count your pulse when at home without a heart monitor.  Patients get hands on experience counting their pulse with staff help and individually.   Heart Attack, Angina, and Risk Factor Modification:  -Group instruction provided by verbal instruction, video, and written materials to support subject.  Instructors address signs and symptoms of angina and heart attacks.    Also discuss risk factors for heart disease and how to make changes to improve heart health risk factors.   CARDIAC REHAB PHASE II EXERCISE from 02/09/2018 in [place]  Date  02/09/18  Educator  RN  Instruction Review Code  2- Demonstrated Understanding      Functional Fitness:  -Group instruction provided by verbal instruction, demonstration, patient participation, and written materials to support subject.  Instructors address safety measures for doing things around the house.  Discuss how to get up and down off the floor, how to pick things up properly, how to safely get out of a chair without assistance, and balance training.   CARDIAC REHAB PHASE II EXERCISE from 02/09/2018 in [place]  Date  01/14/18  Instruction Review Code  2- Demonstrated Understanding      Meditation and Mindfulness:  -Group instruction provided by verbal instruction, patient participation, and written materials to support subject.  Instructor addresses importance of mindfulness and meditation practice to help reduce stress and improve awareness.  Instructor also leads participants through a  meditation exercise.    Stretching for Flexibility and Mobility:  -Group instruction provided by verbal instruction, patient participation, and written materials to support subject.  Instructors lead participants through series of stretches that are designed to increase flexibility thus improving mobility.  These stretches are additional exercise for major muscle groups that are typically performed during regular warm up and cool down.   Hands Only CPR:  -Group verbal, video, and participation provides a basic overview of AHA guidelines for community CPR. Role-play of emergencies allow participants the opportunity to practice calling for help and chest compression technique with discussion of AED use.   Hypertension: -Group verbal and written instruction that provides a basic overview of hypertension including the most recent diagnostic guidelines, risk factor reduction with self-care instructions and medication management.    Nutrition I class: Heart Healthy Eating:  -Group instruction provided by PowerPoint slides, verbal discussion, and written materials to support subject matter. The instructor gives an explanation and review of the Therapeutic Lifestyle Changes diet recommendations, which includes a discussion on  lipid goals, dietary fat, sodium, fiber, plant stanol/sterol esters, sugar, and the components of a well-balanced, healthy diet.   Nutrition II class: Lifestyle Skills:  -Group instruction provided by PowerPoint slides, verbal discussion, and written materials to support subject matter. The instructor gives an explanation and review of label reading, grocery shopping for heart health, heart healthy recipe modifications, and ways to make healthier choices when eating out.   Diabetes Question & Answer:  -Group instruction provided by PowerPoint slides, verbal discussion, and written materials to support subject matter. The instructor gives an explanation and review of diabetes  co-morbidities, pre- and post-prandial blood glucose goals, pre-exercise blood glucose goals, signs, symptoms, and treatment of hypoglycemia and hyperglycemia, and foot care basics.   Diabetes Blitz:  -Group instruction provided by PowerPoint slides, verbal discussion, and written materials to support subject matter. The instructor gives an explanation and review of the physiology behind type 1 and type 2 diabetes, diabetes medications and rational behind using different medications, pre- and post-prandial blood glucose recommendations and Hemoglobin A1c goals, diabetes diet, and exercise including blood glucose guidelines for exercising safely.    Portion Distortion:  -Group instruction provided by PowerPoint slides, verbal discussion, written materials, and food models to support subject matter. The instructor gives an explanation of serving size versus portion size, changes in portions sizes over the last 20 years, and what consists of a serving from each food group.   Stress Management:  -Group instruction provided by verbal instruction, video, and written materials to support subject matter.  Instructors review role of stress in heart disease and how to cope with stress positively.     CARDIAC REHAB PHASE II EXERCISE from 02/09/2018 in [place]  Date  01/05/18  Instruction Review Code  2- Demonstrated Understanding      Exercising on Your Own:  -Group instruction provided by verbal instruction, power point, and written materials to support subject.  Instructors discuss benefits of exercise, components of exercise, frequency and intensity of exercise, and end points for exercise.  Also discuss use of nitroglycerin and activating EMS.  Review options of places to exercise outside of rehab.  Review guidelines for sex with heart disease.   CARDIAC REHAB PHASE II EXERCISE from 02/09/2018 in [place]  Date  12/15/17  Educator   EP  Instruction Review Code  2- Demonstrated Understanding      Cardiac Drugs I:  -Group instruction provided by verbal instruction and written materials to support subject.  Instructor reviews cardiac drug classes: antiplatelets, anticoagulants, beta blockers, and statins.  Instructor discusses reasons, side effects, and lifestyle considerations for each drug class.   CARDIAC REHAB PHASE II EXERCISE from 02/09/2018 in [place]  Date  01/12/18  Educator  Pharmcist  Instruction Review Code  2- Demonstrated Understanding      Cardiac Drugs II:  -Group instruction provided by verbal instruction and written materials to support subject.  Instructor reviews cardiac drug classes: angiotensin converting enzyme inhibitors (ACE-I), angiotensin II receptor blockers (ARBs), nitrates, and calcium channel blockers.  Instructor discusses reasons, side effects, and lifestyle considerations for each drug class.   CARDIAC REHAB PHASE II EXERCISE from 02/09/2018 in [place]  Date  02/02/18  Educator  PharmD  Instruction Review Code  2- Demonstrated Understanding      Anatomy and Physiology of the Circulatory System:  Group verbal and written instruction and models provide basic cardiac anatomy  and physiology, with the coronary electrical and arterial systems. Review of: AMI, Angina, Valve disease, Heart Failure, Peripheral Artery Disease, Cardiac Arrhythmia, Pacemakers, and the ICD.   CARDIAC REHAB PHASE II EXERCISE from 02/09/2018 in [place]  Date  01/26/18  Educator  RN  Instruction Review Code  2- Demonstrated Understanding      Other Education:  -Group or individual verbal, written, or video instructions that support the educational goals of the cardiac rehab program.   Holiday Eating Survival Tips:  -Group instruction provided by PowerPoint slides, verbal discussion, and written materials to  support subject matter. The instructor gives patients tips, tricks, and techniques to help them not only survive but enjoy the holidays despite the onslaught of food that accompanies the holidays.   Knowledge Questionnaire Score: Knowledge Questionnaire Score - 12/09/17 1143      Knowledge Questionnaire Score   Pre Score  19/24       Core Components/Risk Factors/Patient Goals at Admission: Personal Goals and Risk Factors at Admission - 12/09/17 1144      Core Components/Risk Factors/Patient Goals on Admission    Weight Management  Yes;Weight Loss    Intervention  Weight Management: Develop a combined nutrition and exercise program designed to reach desired caloric intake, while maintaining appropriate intake of nutrient and fiber, sodium and fats, and appropriate energy expenditure required for the weight goal.;Weight Management: Provide education and appropriate resources to help participant work on and attain dietary goals.;Weight Management/Obesity: Establish reasonable short term and long term weight goals.;Obesity: Provide education and appropriate resources to help participant work on and attain dietary goals.    Admit Weight  235 lb 10.8 oz (106.9 kg)    Goal Weight: Long Term  225 lb (102.1 kg)    Expected Outcomes  Short Term: Continue to assess and modify interventions until short term weight is achieved;Long Term: Adherence to nutrition and physical activity/exercise program aimed toward attainment of established weight goal;Weight Loss: Understanding of general recommendations for a balanced deficit meal plan, which promotes 1-2 lb weight loss per week and includes a negative energy balance of [phone] kcal/d;Understanding recommendations for meals to include 15-35% energy as protein, 25-35% energy from fat, 35-60% energy from carbohydrates, less than 283m of dietary cholesterol, 20-35 gm of total fiber daily;Understanding of distribution of calorie intake throughout the day with the  consumption of 4-5 meals/snacks    Diabetes  Yes    Intervention  Provide education about signs/symptoms and action to take for hypo/hyperglycemia.;Provide education about proper nutrition, including hydration, and aerobic/resistive exercise prescription along with prescribed medications to achieve blood glucose in normal ranges: Fasting glucose 65-99 mg/dL    Expected Outcomes  Short Term: Participant verbalizes understanding of the signs/symptoms and immediate care of hyper/hypoglycemia, proper foot care and importance of medication, aerobic/resistive exercise and nutrition plan for blood glucose control.;Long Term: Attainment of HbA1C < 7%.    Heart Failure  Yes    Intervention  Provide a combined exercise and nutrition program that is supplemented with education, support and counseling about heart failure. Directed toward relieving symptoms such as shortness of breath, decreased exercise tolerance, and extremity edema.    Expected Outcomes  Improve functional capacity of life;Short term: Attendance in program 2-3 days a week with increased exercise capacity. Reported lower sodium intake. Reported increased fruit and vegetable intake. Reports medication compliance.;Short term: Daily weights obtained and reported for increase. Utilizing diuretic protocols set by physician.;Long term: Adoption of self-care skills and reduction of  barriers for early signs and symptoms recognition and intervention leading to self-care maintenance.    Hypertension  Yes    Intervention  Provide education on lifestyle modifcations including regular physical activity/exercise, weight management, moderate sodium restriction and increased consumption of fresh fruit, vegetables, and low fat dairy, alcohol moderation, and smoking cessation.;Monitor prescription use compliance.    Expected Outcomes  Short Term: Continued assessment and intervention until BP is < 140/7m HG in hypertensive participants. < 130/859mHG in hypertensive  participants with diabetes, heart failure or chronic kidney disease.;Long Term: Maintenance of blood pressure at goal levels.    Lipids  Yes    Intervention  Provide education and support for participant on nutrition & aerobic/resistive exercise along with prescribed medications to achieve LDL <7036mHDL >67m15m  Expected Outcomes  Short Term: Participant states understanding of desired cholesterol values and is compliant with medications prescribed. Participant is following exercise prescription and nutrition guidelines.;Long Term: Cholesterol controlled with medications as prescribed, with individualized exercise RX and with personalized nutrition plan. Value goals: LDL < 70mg33mL > 40 mg.       Core Components/Risk Factors/Patient Goals Review:  Goals and Risk Factor Review    Row Name 12/15/17 1440 12/30/17 1053 01/20/18 1220 02/10/18 1501       Core Components/Risk Factors/Patient Goals Review   Personal Goals Review  Weight Management/Obesity;Lipids;Heart Failure;Hypertension;Diabetes  Weight Management/Obesity;Lipids;Heart Failure;Hypertension;Diabetes  Weight Management/Obesity;Lipids;Heart Failure;Hypertension;Diabetes  Weight Management/Obesity;Lipids;Heart Failure;Hypertension;Diabetes    Review  Pt presents to cardiac rehab willing to participate.  [name] [name] like increase his strength and stamina.   Pt presents to cardiac rehab willing to participate.  [name] [name] exercise and is tolerating it very well.  He enjoys CR.   Pt with multiple RF willing to participate in CR.  [name] [name] exercise.  He feels that he is gaining strength and enjoys coming to CR.   Pt with multiple RF willing to participate in CR.  [name] [name] exercise though his MET levels are not progressing.  He continues to enjoy coming to CR.     Expected Outcomes  Pt will continue to participate in CR exercise, nutrition, and lifestyle modification opportunities.   Pt will continue to  participate in CR exercise, nutrition, and lifestyle modification opportunities.   Pt will continue to participate in CR exercise, nutrition, and lifestyle modification opportunities.   Pt will continue to participate in CR exercise, nutrition, and lifestyle modification opportunities.        Core Components/Risk Factors/Patient Goals at Discharge (Final Review):  Goals and Risk Factor Review - 02/10/18 1501      Core Components/Risk Factors/Patient Goals Review   Personal Goals Review  Weight Management/Obesity;Lipids;Heart Failure;Hypertension;Diabetes    Review  Pt with multiple RF willing to participate in CR.  [name] [name] exercise though his MET levels are not progressing.  He continues to enjoy coming to CR.     Expected Outcomes  Pt will continue to participate in CR exercise, nutrition, and lifestyle modification opportunities.        ITP Comments: ITP Comments    Row Name 12/09/17 1126 12/15/17 1432 12/30/17 1051 01/20/18 1218 02/10/18 1457   ITP Comments  Dr. TraciFransico Himical Director   [name] [name] Machoted exercise today and tolerated it well.   30 Day ITP Review.  [name] [name] exercise recently and is tolerating is very well.  He enjoys coming to CR and is learning the routine.   [place]  Day ITP Review.  [name] [name] is tolerating exercise well.  He feels that he is increasing his strength and enjoys coming to CR.   30 Day ITP Review.  Pt is tolerating exercise well though not progressing MET levels.  He has experienced over 20lbs of weight loss.       Comments: See ITP Comments.

## 2018-02-11 ENCOUNTER — Encounter: Payer: Self-pay | Admitting: Cardiology

## 2018-02-11 ENCOUNTER — Encounter (HOSPITAL_COMMUNITY): Payer: PPO

## 2018-02-11 ENCOUNTER — Ambulatory Visit (HOSPITAL_COMMUNITY)
Admission: RE | Admit: 2018-02-11 | Discharge: 2018-02-11 | Disposition: A | Discharge status: Home / Self Care | Payer: PPO | Source: Ambulatory Visit | Attending: Cardiology | Admitting: Cardiology

## 2018-02-11 ENCOUNTER — Encounter (HOSPITAL_COMMUNITY): Payer: Self-pay

## 2018-02-11 ENCOUNTER — Other Ambulatory Visit (HOSPITAL_COMMUNITY): Payer: Self-pay | Admitting: *Deleted

## 2018-02-11 VITALS — BP 97/74 | HR 69 | Temp 97.4°F | Wt 209.6 lb

## 2018-02-11 DIAGNOSIS — Z95811 Presence of heart assist device: Secondary | ICD-10-CM

## 2018-02-11 DIAGNOSIS — Z95810 Presence of automatic (implantable) cardiac defibrillator: Secondary | ICD-10-CM | POA: Insufficient documentation

## 2018-02-11 DIAGNOSIS — D649 Anemia, unspecified: Secondary | ICD-10-CM | POA: Insufficient documentation

## 2018-02-11 DIAGNOSIS — E1122 Type 2 diabetes mellitus with diabetic chronic kidney disease: Secondary | ICD-10-CM | POA: Diagnosis not present

## 2018-02-11 DIAGNOSIS — I5022 Chronic systolic (congestive) heart failure: Secondary | ICD-10-CM

## 2018-02-11 DIAGNOSIS — E7800 Pure hypercholesterolemia, unspecified: Secondary | ICD-10-CM | POA: Diagnosis not present

## 2018-02-11 DIAGNOSIS — N183 Chronic kidney disease, stage 3 (moderate): Secondary | ICD-10-CM | POA: Diagnosis not present

## 2018-02-11 DIAGNOSIS — Z794 Long term (current) use of insulin: Secondary | ICD-10-CM | POA: Insufficient documentation

## 2018-02-11 DIAGNOSIS — E860 Dehydration: Secondary | ICD-10-CM | POA: Diagnosis not present

## 2018-02-11 DIAGNOSIS — I5082 Biventricular heart failure: Secondary | ICD-10-CM | POA: Insufficient documentation

## 2018-02-11 DIAGNOSIS — I472 Ventricular tachycardia, unspecified: Secondary | ICD-10-CM

## 2018-02-11 DIAGNOSIS — Z7982 Long term (current) use of aspirin: Secondary | ICD-10-CM | POA: Insufficient documentation

## 2018-02-11 DIAGNOSIS — R490 Dysphonia: Secondary | ICD-10-CM | POA: Insufficient documentation

## 2018-02-11 DIAGNOSIS — N179 Acute kidney failure, unspecified: Secondary | ICD-10-CM | POA: Insufficient documentation

## 2018-02-11 DIAGNOSIS — I255 Ischemic cardiomyopathy: Secondary | ICD-10-CM | POA: Insufficient documentation

## 2018-02-11 DIAGNOSIS — Z7901 Long term (current) use of anticoagulants: Secondary | ICD-10-CM

## 2018-02-11 DIAGNOSIS — Z79899 Other long term (current) drug therapy: Secondary | ICD-10-CM | POA: Diagnosis not present

## 2018-02-11 DIAGNOSIS — I2510 Atherosclerotic heart disease of native coronary artery without angina pectoris: Secondary | ICD-10-CM | POA: Diagnosis not present

## 2018-02-11 DIAGNOSIS — I4892 Unspecified atrial flutter: Secondary | ICD-10-CM | POA: Insufficient documentation

## 2018-02-11 DIAGNOSIS — L98418 Non-pressure chronic ulcer of buttock with other specified severity: Secondary | ICD-10-CM | POA: Diagnosis not present

## 2018-02-11 DIAGNOSIS — I4891 Unspecified atrial fibrillation: Secondary | ICD-10-CM | POA: Diagnosis not present

## 2018-02-11 DIAGNOSIS — E785 Hyperlipidemia, unspecified: Secondary | ICD-10-CM | POA: Insufficient documentation

## 2018-02-11 DIAGNOSIS — I130 Hypertensive heart and chronic kidney disease with heart failure and stage 1 through stage 4 chronic kidney disease, or unspecified chronic kidney disease: Secondary | ICD-10-CM | POA: Diagnosis not present

## 2018-02-11 LAB — COMPREHENSIVE METABOLIC PANEL
ALBUMIN: 3 g/dL — AB (ref 3.5–5.0)
ALK PHOS: 139 U/L — AB (ref 38–126)
ALT: 26 U/L (ref 0–44)
ANION GAP: 13 (ref 5–15)
AST: 32 U/L (ref 15–41)
BUN: 35 mg/dL — AB (ref 8–23)
CO2: 23 mmol/L (ref 22–32)
Calcium: 9.7 mg/dL (ref 8.9–10.3)
Chloride: 97 mmol/L — ABNORMAL LOW (ref 98–111)
Creatinine, Ser: 1.92 mg/dL — ABNORMAL HIGH (ref 0.61–1.24)
GFR calc Af Amer: 41 mL/min — ABNORMAL LOW (ref >60)
GFR calc non Af Amer: 35 mL/min — ABNORMAL LOW (ref >60)
GLUCOSE: 257 mg/dL — AB (ref 70–99)
POTASSIUM: 5.3 mmol/L — AB (ref 3.5–5.1)
SODIUM: 133 mmol/L — AB (ref 135–145)
TOTAL PROTEIN: 9.8 g/dL — AB (ref 6.5–8.1)
Total Bilirubin: 1.4 mg/dL — ABNORMAL HIGH (ref 0.3–1.2)

## 2018-02-11 LAB — CBC
HEMATOCRIT: 43.5 % (ref 39.0–52.0)
HEMOGLOBIN: 12.6 g/dL — AB (ref 13.0–17.0)
MCH: 22.1 pg — ABNORMAL LOW (ref 26.0–34.0)
MCHC: 29 g/dL — AB (ref 30.0–36.0)
MCV: 76.3 fL — AB (ref 80.0–100.0)
Platelets: 192 10*3/uL (ref 150–400)
RBC: 5.7 MIL/uL (ref 4.22–5.81)
RDW: 22.8 % — ABNORMAL HIGH (ref 11.5–15.5)
WBC: 5 10*3/uL (ref 4.0–10.5)
nRBC: 0 % (ref 0.0–0.2)

## 2018-02-11 LAB — MAGNESIUM: Magnesium: 2.2 mg/dL (ref 1.7–2.4)

## 2018-02-11 LAB — TSH: TSH: 8.175 u[IU]/mL — AB (ref 0.350–4.500)

## 2018-02-11 LAB — PREALBUMIN: PREALBUMIN: 25.2 mg/dL (ref 18–38)

## 2018-02-11 LAB — LACTATE DEHYDROGENASE: LDH: 189 U/L (ref 98–192)

## 2018-02-11 MED ORDER — TORSEMIDE 20 MG PO TABS: 20.0000 mg | ORAL_TABLET | ORAL | 2 refills | Status: DC

## 2018-02-11 NOTE — Addendum Note (Signed)
Encounter addended by: Mertha Baars, RN on: 02/11/2018 2:12 PM  Actions taken: Clinical Note Signed

## 2018-02-11 NOTE — Progress Notes (Signed)
Patient presents for 1 month follow up in [facility] today. Reports no problems with VAD equipment or concerns with drive line.   Pt states that he has been feeling weak and not himself over the last week. He has had episodes of dizziness and lightheadedness twice this week. He had a fall at the end of last week when he tripped over his MPU cord in the bathroom. He denies any injury, or hitting his head. He says he has no appetite. He has lost 22 pounds since his last clinic visit.   He is now going to cardiac rehab 3x a week. Does not feel like he can participate in his appointment scheduled for today.   He has two itchy raised spots on his left arm and left leg. Denies burning or tingling at either site. Advised by Jonni Sanger to try hydrocortisone cream. Instructed patient to call us and primary care if the spots became blisters or he felt any burning or tingling.   6 month [facility] follow up completed including:  Quality of Life, KCCQ-12, and Neurocognitive trail making.   Unable to complete 6 min walk due to weakness.   Vital Signs:  Temp: 97.4 oral Doppler Pressure: 84 Automatc BP: 97/74 (82) HR: 69 ; regular; EKG completed. SPO2: 100 %  Weight: 209.6lb w/o eqt  Last weight: 231.8b w/o eqip Home weights: 211-215lbs   VAD Indication: Destination Therapy- Implanted 08/16/17   LVAD assessment: HM III: VAD Speed: 5300 rpms Flow: 4.4 Power: 3.9 w    PI: 4.1 Alarms: no clinical alarms  Events: 100+ today. Several were true suction events. Event log sent in for analysis.   Fixed speed: 5300 Low speed limit: 5000  Primary: Back up battery expiring in 27 months. Secondary: Back up battery expiring in 30 months    I reviewed the LVAD parameters from today and compared the results to the patient's prior recorded data. LVAD interrogation was NEGATIVE for significant power changes, NEGATIVE for clinical alarms and STABLE for PI events/speed drops. Hematocrit was updated, but no other  changes were made and pump is functioning within specified parameters. Pt is performing daily controller and system monitor self tests along with completing weekly and monthly maintenance for LVAD equipment.  LVAD equipment check completed and is in good working order. Back-up equipment present. Performed self-test on equipment.   Annual Equipment Maintenance on UBC/PM was performed on 09/17/17.   Exit Site Care: VAD dressing and anchor removed and site care performed using sterile technique. Drive line exit site cleaned with Chlora prep applicators x 2, allowed to dry, and allowed to dry before Sorbaview and biopatch applied. Exit site healing and almost fully incorporated.The velour is exposed 1/2 inch at exit site. No redness, or foul odor noted. Scant amount of yellow drainage present.  Drive line anchor re-applied. Has two anchors in place. Pt denies fever or chills. Caregiver instructed to advance to weekly dressing changes.         Significant Events on VAD Support:    Device: Medtronic 3 Lead Therapies: on Last check: 10/17/16  Labs:   Hgb 12.6 - No S/S of bleeding. Specifically denies melena/BRBPR or nosebleeds. States at times he has small amount bloody drainage when he blows his nose, but no nosebleeds.   LDH stable at 189  with a baseline ranging from 170-250.  Denies tea-colored urine. No power elevations noted on interrogation.   INR- 2.2 today. Adjustments made per Doroteo Bradford.  Plan: 1. Do not take Torsamide today  or Monday 12/16. 2. Decrease Torsamide dosing to 20mg  (1 tablet) on Mondays and Fridays.  3. If you develop shortness of breath, or swelling, or other heart failure symptoms, call the VAD pager [phone] immediately.  4. Advance dressing changes to weekly.  5. Return to VAD clinic on Monday for labs, BP, dressing, and VAD number check.  6. Return to [place] in 3 weeks for follow up appointment: January 3rd at 0930.    Emerson Monte RN [place]  Coordinator  Office: [phone]  24/7 Pager: [phone]

## 2018-02-11 NOTE — Progress Notes (Signed)
Remote ICD transmission.   

## 2018-02-11 NOTE — Addendum Note (Signed)
Encounter addended by: Mertha Baars, RN on: 02/11/2018 12:42 PM  Actions taken: Pend clinical note

## 2018-02-11 NOTE — Patient Instructions (Addendum)
1. Do not take Torsamide today or Monday 12/16. 2. Decrease Torsamide dosing to 20mg  (1 tablet) on Mondays and Fridays.  3. If you develop shortness of breath, or swelling, or other heart failure symptoms, call the VAD pager [phone] immediately.  4. Advance dressing changes to weekly.  5. Return to VAD clinic on Monday for BP, dressing, and VAD number check.  6. Return to [place] clinic in 3 weeks for follow up appointment: January 3rd at 0930.

## 2018-02-11 NOTE — Progress Notes (Signed)
VAD Clinic Note   HF: Dr. [name]   HPI: [name] [name] is a 67 y.o. male with h/o chronic systolic CHF due to ICM, s/p LVAD HM3 08/16/2017, [name], [name], [name], [name], [name], HTN, HL, and CKD III.  Admitted 08/11/17 with recurrent A/C systolic CHF. Milrinone added, but mixed venous saturation remained low. Seen by CT surgery/VAD team and deemed appropriate for HMIII under DT criteria. He underwent HMIII on 6/17. Chest was unable to be closed due to high intrathoracic pressure and RV failure.  He returned to the OR on 6/18 for evacuation of hematoma. Chest was later closed on 6/20. Post operative course complicated by  open chest, RV failure, recurrent VT, respiratory failure, and dysphagia. CCM consulted for vent management with extubation completed on 08/26/17.  As he improved all drips gradually weaned off. Prior to d/c  ramp echo was completed with speed optimized.  Spent 7/11 - 7/27 in CIR. Underwent hydrotherapy for his buttock wound(s). 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   Seen on 12/10/17 for VT at 180s. ATP attempted x 4, and then shocked with return to NSR. Started amio 200 bid. Saw EP in f/u and said to continue amio 200 bid for 3-4 weeks then got to 200 daily.   He presents today for regular Heart Failure/LVAD follow up. Feeling tired and rundown, and has lost 20 lbs in the past month.  His appetite has decreased. He continues to do CR 3 times a weekly, and was feeling stronger. He denies orthopnea or PND. Mild lightheadedness at times. Frequent PI events. Denies VAD alarms. Taking all medications as described including torsemide 40 mg M/W/F. He denies bleeding, melena, or neuro symptoms. He c/o 2 red spots on his RUE with itching. No drainage, discharge, fevers, or chills.   VAD Indication: Destination Therapy- Implanted 08/16/17    LVAD assessment: HM III: VAD Speed: 5300  Flow: 4.4 Power: 3.9 w PI: 4.1 Alarms: None Events: > 100 PI events  Fixed speed: 5300 Low speed limit: 5000  Primary: Back up battery expiring in 27 months. Secondary: Back up battery expiring in 29 months   Past Medical History:  Diagnosis Date  . AICD (automatic cardioverter/defibrillator) present 02/05/2014   Upgrade to Medtronic biventricular ICD, serial number  [id number]   . Atrial flutter ([place]) 04/2012   s/p TEE-EPS+RFCA 04/2012  . CAD (coronary artery disease) 5366,4403 X 2    RCA-T, 70% PL (off CFX), 99% Prox LAD/90% Dist LAD, S/P TAXUS stent x 2  . CHF (congestive heart failure) ([place])   . Chronic anticoagulation   . Chronic systolic heart failure ([place])   . CKD (chronic kidney disease)   . Diabetic retinopathy ([place])   . DM type 2 (diabetes mellitus, type 2) ([facility])    insulin dependent  . HTN (hypertension)   . Hypercholesteremia    ablation  . ICD (implantable cardiac defibrillator) in place   . Ischemic cardiomyopathy March 2015   20-25% 2D   . Nephrolithiasis   . Ventricular tachycardia ([place])     Current Outpatient Medications  Medication Sig Dispense Refill  . amiodarone (PACERONE) 200 MG tablet Take one tablet in the AM.  Take 1/2 tablet in the PM 150 tablet 3  . ascorbic acid (VITAMIN C) 500 MG tablet Take 1 tablet (500 mg total) by mouth 2 (two) times daily.  100 tablet 0  . aspirin 81 MG chewable tablet Chew 1 tablet (81 mg total) by mouth daily.    Marland Kitchen gabapentin (NEURONTIN) 100 MG capsule Take 2 in the morning 1 at lunch and 3 at bedtime. 540 capsule 3  . Insulin Pen Needle (COMFORT EZ PEN NEEDLES) 32G X 6 MM MISC 1 application by Does not apply route at bedtime. 100 each 0  . magnesium oxide (MAG-OX) 400 (241.3 Mg) MG tablet Take 1 tablet (400 mg total) by mouth 2 (two) times daily. 60 tablet 0  . Multiple Vitamin (MULTIVITAMIN WITH MINERALS) TABS tablet Take 1 tablet by mouth daily.    . potassium  chloride (K-DUR) 10 MEQ tablet Take 2 tablets (20 mEq total) by mouth every Monday, Wednesday, and Friday. 90 tablet 3  . protein supplement shake (PREMIER PROTEIN) LIQD Take 325 mLs (11 oz total) by mouth 2 (two) times daily between meals. (Patient taking differently: Take 11 oz by mouth 2 (two) times daily between meals. 3x a week)  0  . spironolactone (ALDACTONE) 25 MG tablet Take 1 tablet (25 mg total) by mouth daily. 30 tablet 11  . torsemide (DEMADEX) 20 MG tablet Take 2 tablets (40 mg total) by mouth every Monday, Wednesday, and Friday. 90 tablet 0  . warfarin (COUMADIN) 2.5 MG tablet Take 2.5 mg by mouth daily.      No current facility-administered medications for this visit.    Allergies  Allergen Reactions  . Penicillins Hives, Itching and Other (See Comments)    Has patient had a PCN reaction causing immediate rash, facial/tongue/throat swelling, SOB or lightheadedness with hypotension:# # Yes # # Has patient had a PCN reaction causing severe rash involving mucus membranes or skin necrosis: No Has patient had a PCN reaction that required hospitalization: No Has patient had a PCN reaction occurring within the last 10 years: No If all of the above answers are "NO", then may proceed with Cephalosporin use.    Review of systems complete and found to be negative unless listed in HPI.    Vitals:   02/11/18 1039 02/11/18 1044  BP: (!) 84/0 97/74  Pulse: 69   Temp: (!) 97.4 F (36.3 C)   SpO2: 100%   Weight: 95.1 kg (209 lb 9.6 oz)     Wt Readings from Last 3 Encounters:  02/10/18 95.7 kg (211 lb)  01/18/18 99.8 kg (220 lb)  01/07/18 105.1 kg (231 lb 12.8 oz)    Vital Signs:  Doppler Pressure:84 Automatc BP: 97/74 ( HR: 69 SPO2: 100%  Weight: 209 lbs lb w/ eqt Last weight: 231.8lb w/o eqip Home weights: ~205 lbs  Physical Exam: General: Fatigued appearing. NAD HEENT: Normal. Neck: Supple, JVP flat. Carotids OK.  Cardiac:  Mechanical heart sounds with LVAD hum  present.  Lungs:  CTAB, normal effort.  Abdomen:  NT, ND, no HSM. No bruits or masses. +BS  LVAD exit site: Well-healed and incorporated. Dressing dry and intact. No erythema or drainage. Stabilization device present and accurately applied. Driveline dressing changed daily per sterile technique. Extremities:  Warm and dry. No cyanosis, clubbing, rash, or edema.  Neuro:  Alert & oriented x 3. Cranial nerves grossly intact. Moves all 4 extremities w/o difficulty. Affect pleasant     ASSESSMENT AND PLAN:  1. Chronic systolic CHF with biventricular failure-> cardiogenic shock: - Echo 08/13/2017 EF 20-25%.s/p Medtronic BiV ICD in place. Cath 12/18 with stable 1v CAD. s/p HM-3 implant 08/16/17.  - NYHA II symptoms chronically.  -  Volume status dry on exam. - Hold torsemide through weekend. The most he should take right now is 20 mg Monday and Friday, but only as needed.  - Stop K supp.   - Continue spiro 25 mg daily.  - Off digoxin. 2. 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 - On aspirin and coumadin. Denies bleeding.  - INR goal 2-2.5 INR 2.2. Dosing per Pharm D.  - Ramp ECHO completed 09/09/2017 with speed increased to 5300.  - VAD interrogated personally. Parameters stable.   - LDH 189 - Driveline site infection resolved with doxy. Driveline changes to 2x/week 3.AKI on CKD III: - Creatinine 1.92 today in setting of dehydration.  4. [name]/VF:  - ICD shock 12/09/17 with VT in 180s.  - ICD interrogated personally. No VT.  - Continue amiodarone 200 mg daily.  5. AFL/atrial fibrillation:  - S/p previous ablation. He is currently in NSR -Continue amio for VT.On coumadin as above.  - No change.  6. CAD s/p PCI of RCA and FIE:PPIRJJ cath with stable CAD as above:  - No s/s of ischemia.    - Continue ASA and statin. Continue CR  7. [name]: Recent A1c8.3 on 6/3. - Per PCP.  8. Anemia:  - Hgb stable at 12.6 today.  9. Deconditioning - Stable. Continue CR 10. Buttock Pressure Ulcer: -  Resolved.  Dehydrated on exam and by labs. Adjustments made. We will follow up with BMET next week.   Shirley Friar, PA-C  02/11/18   Greater than 50% of the 40 minute visit was spent in counseling/coordination of care regarding disease state education, salt/fluid restriction, sliding scale diuretics, and medication compliance.

## 2018-02-14 ENCOUNTER — Encounter (HOSPITAL_COMMUNITY): Admission: RE | Admit: 2018-02-14 | Payer: PPO | Source: Ambulatory Visit

## 2018-02-14 ENCOUNTER — Encounter (HOSPITAL_COMMUNITY): Payer: PPO

## 2018-02-14 ENCOUNTER — Ambulatory Visit (HOSPITAL_COMMUNITY)
Admission: RE | Admit: 2018-02-14 | Discharge: 2018-02-14 | Disposition: A | Discharge status: Home / Self Care | Payer: PPO | Source: Ambulatory Visit | Attending: Internal Medicine | Admitting: Internal Medicine

## 2018-02-14 ENCOUNTER — Telehealth (HOSPITAL_COMMUNITY): Payer: Self-pay | Admitting: *Deleted

## 2018-02-14 DIAGNOSIS — Z880 Allergy status to penicillin: Secondary | ICD-10-CM | POA: Diagnosis not present

## 2018-02-14 DIAGNOSIS — I5022 Chronic systolic (congestive) heart failure: Secondary | ICD-10-CM | POA: Insufficient documentation

## 2018-02-14 DIAGNOSIS — I255 Ischemic cardiomyopathy: Secondary | ICD-10-CM | POA: Diagnosis not present

## 2018-02-14 DIAGNOSIS — I4891 Unspecified atrial fibrillation: Secondary | ICD-10-CM | POA: Diagnosis not present

## 2018-02-14 DIAGNOSIS — N183 Chronic kidney disease, stage 3 (moderate): Secondary | ICD-10-CM | POA: Diagnosis not present

## 2018-02-14 DIAGNOSIS — Z794 Long term (current) use of insulin: Secondary | ICD-10-CM | POA: Insufficient documentation

## 2018-02-14 DIAGNOSIS — Z95810 Presence of automatic (implantable) cardiac defibrillator: Secondary | ICD-10-CM | POA: Insufficient documentation

## 2018-02-14 DIAGNOSIS — E7800 Pure hypercholesterolemia, unspecified: Secondary | ICD-10-CM | POA: Insufficient documentation

## 2018-02-14 DIAGNOSIS — Z95811 Presence of heart assist device: Secondary | ICD-10-CM | POA: Diagnosis not present

## 2018-02-14 DIAGNOSIS — E1122 Type 2 diabetes mellitus with diabetic chronic kidney disease: Secondary | ICD-10-CM | POA: Insufficient documentation

## 2018-02-14 DIAGNOSIS — I472 Ventricular tachycardia, unspecified: Secondary | ICD-10-CM

## 2018-02-14 DIAGNOSIS — I130 Hypertensive heart and chronic kidney disease with heart failure and stage 1 through stage 4 chronic kidney disease, or unspecified chronic kidney disease: Secondary | ICD-10-CM | POA: Diagnosis not present

## 2018-02-14 DIAGNOSIS — D631 Anemia in chronic kidney disease: Secondary | ICD-10-CM | POA: Diagnosis not present

## 2018-02-14 DIAGNOSIS — Z7901 Long term (current) use of anticoagulants: Secondary | ICD-10-CM | POA: Insufficient documentation

## 2018-02-14 DIAGNOSIS — I5082 Biventricular heart failure: Secondary | ICD-10-CM | POA: Insufficient documentation

## 2018-02-14 DIAGNOSIS — N179 Acute kidney failure, unspecified: Secondary | ICD-10-CM | POA: Insufficient documentation

## 2018-02-14 DIAGNOSIS — I2510 Atherosclerotic heart disease of native coronary artery without angina pectoris: Secondary | ICD-10-CM | POA: Diagnosis not present

## 2018-02-14 DIAGNOSIS — Z7982 Long term (current) use of aspirin: Secondary | ICD-10-CM | POA: Diagnosis not present

## 2018-02-14 DIAGNOSIS — Z9889 Other specified postprocedural states: Secondary | ICD-10-CM | POA: Insufficient documentation

## 2018-02-14 LAB — BASIC METABOLIC PANEL
Anion gap: 8 (ref 5–15)
BUN: 26 mg/dL — ABNORMAL HIGH (ref 8–23)
CO2: 22 mmol/L (ref 22–32)
Calcium: 9.2 mg/dL (ref 8.9–10.3)
Chloride: 103 mmol/L (ref 98–111)
Creatinine, Ser: 1.56 mg/dL — ABNORMAL HIGH (ref 0.61–1.24)
GFR calc Af Amer: 52 mL/min — ABNORMAL LOW (ref >60)
GFR calc non Af Amer: 45 mL/min — ABNORMAL LOW (ref >60)
Glucose, Bld: 200 mg/dL — ABNORMAL HIGH (ref 70–99)
Potassium: 5.2 mmol/L — ABNORMAL HIGH (ref 3.5–5.1)
Sodium: 133 mmol/L — ABNORMAL LOW (ref 135–145)

## 2018-02-14 MED ORDER — POTASSIUM CHLORIDE ER 10 MEQ PO TBCR: 20.0000 meq | EXTENDED_RELEASE_TABLET | ORAL | 3 refills | Status: DC | PRN

## 2018-02-14 MED ORDER — SPIRONOLACTONE 25 MG PO TABS: 12.5000 mg | ORAL_TABLET | Freq: Every day | ORAL | 11 refills | Status: DC

## 2018-02-14 MED ORDER — TORSEMIDE 20 MG PO TABS: 20.0000 mg | ORAL_TABLET | ORAL | 2 refills | Status: DC | PRN

## 2018-02-14 NOTE — Progress Notes (Addendum)
Patient presents for nurse visit in [facility] today. Reports no problems with VAD equipment or concerns with drive line.   Pt was seen on Friday in the clinic with a 22 lb weight loss, weakness and fatigue. Pt was instructed to hold his Torsemide Friday and Monday and the resume a dose of 20 mg on Monday and Friday only. Pt accidentally took Torsemide 20 mg today. Pt states that he is feeling a little better but is still very weak and not himself.   Vital Signs:  Doppler Pressure: 98 Automatc BP: 81/60 (70) HR: 68  SPO2: 99 %  Weight: 213.4lb w/o eqt  Last weight: 209.6lb w/o eqip Home weights: 211-215lbs   VAD Indication: Destination Therapy- Implanted 08/16/17   LVAD assessment: HM III: VAD Speed: 5300 rpms Flow: 4.3 Power: 3.9 w    PI: 4.2 Alarms: no clinical alarms  Events: 100+ today. Several were true suction events.   Fixed speed: 5300 Low speed limit: 5000  Primary: Back up battery expiring in 27 months. Secondary: Back up battery expiring in 30 months    I reviewed the LVAD parameters from today and compared the results to the patient's prior recorded data. LVAD interrogation was NEGATIVE for significant power changes, NEGATIVE for clinical alarms and STABLE for PI events/speed drops. Hematocrit was updated, but no other changes were made and pump is functioning within specified parameters. Pt is performing daily controller and system monitor self tests along with completing weekly and monthly maintenance for LVAD equipment.  LVAD equipment check completed and is in good working order. Back-up equipment present. Performed self-test on equipment.   Annual Equipment Maintenance on UBC/PM was performed on 09/17/17.   Exit Site Care: VAD dressing and anchor removed and site care performed using sterile technique. Drive line exit site cleaned with Chlora prep applicators x 2, allowed to dry, and gauze dressing with silver strip  applied. Exit site healing and almost  fully incorporated.The velour is exposed 1/2 inch at exit site. No redness, or foul odor noted. Small amount of yellow drainage present.  Drive line anchor re-applied. Has two anchors in place. Pt denies fever or chills. Caregiver instructed to resume twice a week dressing change with silver strip until further notice.  Significant Events on VAD Support:    Device: Medtronic 3 Lead Therapies: on Last check: 10/17/16  Labs:   Hgb 12.6 - No S/S of bleeding. Specifically denies melena/BRBPR or nosebleeds. States at times he has small amount bloody drainage when he blows his nose, but no nosebleeds.   LDH stable at 189  with a baseline ranging from 170-250.  Denies tea-colored urine. No power elevations noted on interrogation.   INR- 2.2 today. Adjustments made per Doroteo Bradford.  Plan: 1. STOP Torsemide, only take when home weight is greater than 216lbs. Take Potassium if you need to take Torsemide. 2. Decrease Spirolactone to 12.5mg  daily (1/2 tablet). 3. Resume twice weekly dressing changes.  4. Return to clinic at regularly scheduled appt.  Tanda Rockers RN [place] Coordinator  Office: [phone]  24/7 Pager: [phone]

## 2018-02-14 NOTE — Progress Notes (Signed)
VAD Clinic Note   HF: Dr. [name]   HPI: [name] [name] is a 67 y.o. male with h/o chronic systolic CHF due to ICM, s/p LVAD HM3 08/16/2017, [name], [name], [name], [name], [name], HTN, HL, and CKD III.  Admitted 08/11/17 with recurrent A/C systolic CHF. Milrinone added, but mixed venous saturation remained low. Seen by CT surgery/VAD team and deemed appropriate for HMIII under DT criteria. He underwent HMIII on 6/17. Chest was unable to be closed due to high intrathoracic pressure and RV failure.  He returned to the OR on 6/18 for evacuation of hematoma. Chest was later closed on 6/20. Post operative course complicated by  open chest, RV failure, recurrent VT, respiratory failure, and dysphagia. CCM consulted for vent management with extubation completed on 08/26/17.  As he improved all drips gradually weaned off. Prior to d/c  ramp echo was completed with speed optimized.  Spent 7/11 - 7/27 in CIR. Underwent hydrotherapy for his buttock wound(s). 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   Seen on 12/10/17 for VT at 180s. ATP attempted x 4, and then shocked with return to NSR. Started amio 200 bid. Saw EP in f/u and said to continue amio 200 bid for 3-4 weeks then got to 200 daily.   He presents today for regular Heart Failure/LVAD follow up. Patient seen for work in visit. Pt was seen on Friday in the clinic with a 22 lb weight loss, weakness and fatigue. Pt was instructed to hold his Torsemide Friday and Monday and the resume a dose of 20 mg on Monday and Friday only. Pt accidentally took Torsemide 20 mg today. Pt states that he is feeling a little better but is still very weak and not himself. VAD shows multiple PI events. Denies dizziness. . No fevers, chills or problems with driveline. No bleeding, melena or neuro symptoms.    VAD Indication: Destination  Therapy- Implanted 08/16/17   LVAD assessment: HM III: VAD Speed: 5300 rpms Flow: 4.3 Power: 3.9 w PI: 4.2 Alarms: no clinical alarms Events: 100+ today. Several were true suction events.   Fixed speed: 5300 Low speed limit: 5000  Primary: Back up battery expiring in 27 months. Secondary: Back up battery expiring in 30 months   Past Medical History:  Diagnosis Date  . AICD (automatic cardioverter/defibrillator) present 02/05/2014   Upgrade to Medtronic biventricular ICD, serial number  BLD [id number]   . Atrial flutter ([place]) 04/2012   s/p TEE-EPS+RFCA 04/2012  . CAD (coronary artery disease) 6283,1517 X 2    RCA-T, 70% PL (off CFX), 99% Prox LAD/90% Dist LAD, S/P TAXUS stent x 2  . CHF (congestive heart failure) ([place])   . Chronic anticoagulation   . Chronic systolic heart failure ([place])   . CKD (chronic kidney disease)   . Diabetic retinopathy ([place])   . DM type 2 (diabetes mellitus, type 2) ([facility])    insulin dependent  . HTN (hypertension)   . Hypercholesteremia    ablation  . ICD (implantable cardiac defibrillator) in place   . Ischemic cardiomyopathy March 2015   20-25% 2D   . Nephrolithiasis   . Ventricular tachycardia ([place])     Current Outpatient Medications  Medication Sig Dispense Refill  . amiodarone (PACERONE) 200 MG tablet Take one tablet in the AM.  Take 1/2 tablet in the PM 150 tablet 3  .  ascorbic acid (VITAMIN C) 500 MG tablet Take 1 tablet (500 mg total) by mouth 2 (two) times daily. 100 tablet 0  . aspirin 81 MG chewable tablet Chew 1 tablet (81 mg total) by mouth daily.    Marland Kitchen gabapentin (NEURONTIN) 100 MG capsule Take 2 in the morning 1 at lunch and 3 at bedtime. (Patient taking differently: Take 2 in the morning and 3 at bedtime.) 540 capsule 3  . Insulin Pen Needle (COMFORT EZ PEN NEEDLES) 32G X 6 MM MISC 1 application by Does not apply route at bedtime. 100 each 0  . magnesium oxide (MAG-OX) 400 (241.3 Mg) MG tablet Take 1 tablet (400 mg total) by  mouth 2 (two) times daily. 60 tablet 0  . Multiple Vitamin (MULTIVITAMIN WITH MINERALS) TABS tablet Take 1 tablet by mouth daily.    . potassium chloride (K-DUR) 10 MEQ tablet Take 2 tablets (20 mEq total) by mouth as needed. Only take when you take Torsemide 90 tablet 3  . protein supplement shake (PREMIER PROTEIN) LIQD Take 325 mLs (11 oz total) by mouth 2 (two) times daily between meals. (Patient taking differently: Take 11 oz by mouth 2 (two) times daily between meals. 3x a week)  0  . spironolactone (ALDACTONE) 25 MG tablet Take 0.5 tablets (12.5 mg total) by mouth daily. 30 tablet 11  . torsemide (DEMADEX) 20 MG tablet Take 1 tablet (20 mg total) by mouth as needed. Only take Torsemide if home weight is over 216lbs 90 tablet 2  . warfarin (COUMADIN) 2.5 MG tablet Take 2.5 mg by mouth daily.      No current facility-administered medications for this encounter.    Allergies  Allergen Reactions  . Penicillins Hives, Itching and Other (See Comments)    Has patient had a PCN reaction causing immediate rash, facial/tongue/throat swelling, SOB or lightheadedness with hypotension:# # Yes # # Has patient had a PCN reaction causing severe rash involving mucus membranes or skin necrosis: No Has patient had a PCN reaction that required hospitalization: No Has patient had a PCN reaction occurring within the last 10 years: No If all of the above answers are "NO", then may proceed with Cephalosporin use.    Review of systems complete and found to be negative unless listed in HPI.      Wt Readings from Last 3 Encounters:  02/11/18 95.1 kg (209 lb 9.6 oz)  02/10/18 95.7 kg (211 lb)  01/18/18 99.8 kg (220 lb)     Vital Signs:  Doppler Pressure:98 Automatc BP: 81/60 (70) HR: 68  SPO2: 99 %  Weight: 213.4lb w/o eqt  Last weight: 209.6lb w/o eqip Home weights: 211-215lbs   Physical Exam: General:  NAD.  HEENT: normal  Neck: supple. JVP not elevated.  Carotids 2+ bilat; no bruits.  No lymphadenopathy or thryomegaly appreciated. Cor: LVAD hum.  Lungs: Clear. Abdomen: soft, nontender, non-distended. No hepatosplenomegaly. No bruits or masses. Good bowel sounds. Driveline site clean. Anchor in place.  Extremities: no cyanosis, clubbing, rash. Warm no edema  Neuro: alert & oriented x 3. No focal deficits. Moves all 4 without problem   ASSESSMENT AND PLAN:  1. Chronic systolic CHF with biventricular failure-> cardiogenic shock: - Echo 08/13/2017 EF 20-25%.s/p Medtronic BiV ICD in place. Cath 12/18 with stable 1v CAD. s/p HM-3 implant 08/16/17.  - Doing well. NYHA II symptoms - Remains dry on exam. Will stop torsemide completely and just take as needed if weight > 215 or having symptoms of volume overload.   -  Decrease spiro to 12.5 mg daily.  2. 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 - On aspirin and coumadin. Denies bleeding.  - INR goal 2-2.5 INR 2.2 today. Discussed dosing with PharmD personally. - Ramp ECHO completed 09/09/2017 with speed increased to 5300.  - VAD interrogated personally. See details above.  - LDH 189 last week  - Driveline site looks good.  3.AKI on CKD III: - Creatinine improved to 1.56 today down from 1.92 4. [name]/VF:  - ICD shock 12/09/17 with VT in 180s.  - no recent VT - Continue amiodarone 200 mg daily.  5. AFL/atrial fibrillation:  - S/p previous ablation. He is currently in NSR -Continue amio for VT.On coumadin as above.  - No change.  6. CAD s/p PCI of RCA and LMB:EMLJQG cath with stable CAD as above:  - No s/sof ischemia  - Continue ASA and statin. Continue CR  7. [name]: Recent A1c8.3 on 6/3. - Per PCP.  8. Anemia:  - Hgb stable at 12.6   Glori Bickers, MD  02/14/18

## 2018-02-14 NOTE — Addendum Note (Signed)
Encounter addended by: Jolaine Artist, MD on: 02/14/2018 9:00 PM  Actions taken: Clinical Note Signed, LOS modified, Visit diagnoses modified, Charge Capture section accepted

## 2018-02-14 NOTE — Addendum Note (Signed)
Encounter addended by: Christinia Gully, RN on: 02/14/2018 1:22 PM  Actions taken: Clinical Note Signed

## 2018-02-14 NOTE — Addendum Note (Signed)
Encounter addended by: Christinia Gully, RN on: 02/14/2018 11:05 AM  Actions taken: Medication long-term status modified, Order list changed, Diagnosis association updated, Clinical Note Signed

## 2018-02-14 NOTE — Patient Instructions (Signed)
1. STOP Torsemide, only take when home weight is greater than 216lbs. Take Potassium if you need to take Torsemide. 2. Decrease Spirolactone to 12.5mg  daily (1/2 tablet). 3. Resume twice weekly dressing changes.  4. Return to clinic at regularly scheduled appt.

## 2018-02-15 LAB — T4: T4, Total: 8.4 ug/dL (ref 4.5–12.0)

## 2018-02-15 LAB — T3: T3, Total: 79 ng/dL (ref 71–180)

## 2018-02-16 ENCOUNTER — Encounter (HOSPITAL_COMMUNITY)
Admission: RE | Admit: 2018-02-16 | Discharge: 2018-02-16 | Disposition: A | Discharge status: Home / Self Care | Payer: PPO | Source: Ambulatory Visit | Attending: Internal Medicine | Admitting: Internal Medicine

## 2018-02-16 ENCOUNTER — Encounter (HOSPITAL_COMMUNITY): Payer: PPO

## 2018-02-16 DIAGNOSIS — Z95811 Presence of heart assist device: Secondary | ICD-10-CM | POA: Diagnosis not present

## 2018-02-16 DIAGNOSIS — I5022 Chronic systolic (congestive) heart failure: Secondary | ICD-10-CM

## 2018-02-17 ENCOUNTER — Ambulatory Visit (HOSPITAL_COMMUNITY): Payer: Self-pay | Admitting: Pharmacist

## 2018-02-17 DIAGNOSIS — Z7901 Long term (current) use of anticoagulants: Secondary | ICD-10-CM

## 2018-02-17 LAB — POCT INR: INR: 3.5 — AB (ref 2.0–3.0)

## 2018-02-18 ENCOUNTER — Encounter (HOSPITAL_COMMUNITY)
Admission: RE | Admit: 2018-02-18 | Discharge: 2018-02-18 | Disposition: A | Discharge status: Home / Self Care | Payer: PPO | Source: Ambulatory Visit | Attending: Internal Medicine | Admitting: Internal Medicine

## 2018-02-18 ENCOUNTER — Encounter (HOSPITAL_COMMUNITY): Payer: PPO

## 2018-02-18 DIAGNOSIS — Z95811 Presence of heart assist device: Secondary | ICD-10-CM

## 2018-02-18 DIAGNOSIS — I5022 Chronic systolic (congestive) heart failure: Secondary | ICD-10-CM

## 2018-02-21 ENCOUNTER — Encounter (HOSPITAL_COMMUNITY)
Admission: RE | Admit: 2018-02-21 | Discharge: 2018-02-21 | Disposition: A | Discharge status: Home / Self Care | Payer: PPO | Source: Ambulatory Visit | Attending: Internal Medicine | Admitting: Internal Medicine

## 2018-02-21 ENCOUNTER — Encounter (HOSPITAL_COMMUNITY): Payer: PPO

## 2018-02-21 DIAGNOSIS — I5022 Chronic systolic (congestive) heart failure: Secondary | ICD-10-CM

## 2018-02-21 DIAGNOSIS — Z95811 Presence of heart assist device: Secondary | ICD-10-CM

## 2018-02-22 NOTE — Progress Notes (Signed)
No ICM remote transmission received for 02/10/2018 and next ICM transmission scheduled for 03/01/2018.    

## 2018-02-25 ENCOUNTER — Ambulatory Visit (HOSPITAL_COMMUNITY): Payer: Self-pay | Admitting: *Deleted

## 2018-02-25 ENCOUNTER — Encounter (HOSPITAL_COMMUNITY): Payer: PPO

## 2018-02-25 ENCOUNTER — Other Ambulatory Visit (HOSPITAL_COMMUNITY): Payer: Self-pay | Admitting: *Deleted

## 2018-02-25 ENCOUNTER — Encounter (HOSPITAL_COMMUNITY)
Admission: RE | Admit: 2018-02-25 | Discharge: 2018-02-25 | Disposition: A | Discharge status: Home / Self Care | Payer: PPO | Source: Ambulatory Visit | Attending: Internal Medicine | Admitting: Internal Medicine

## 2018-02-25 DIAGNOSIS — Z95811 Presence of heart assist device: Secondary | ICD-10-CM

## 2018-02-25 DIAGNOSIS — Z7901 Long term (current) use of anticoagulants: Secondary | ICD-10-CM | POA: Diagnosis not present

## 2018-02-25 DIAGNOSIS — I5022 Chronic systolic (congestive) heart failure: Secondary | ICD-10-CM

## 2018-02-25 LAB — POCT INR: INR: 1.3 — AB (ref 2.0–3.0)

## 2018-02-28 ENCOUNTER — Encounter (HOSPITAL_COMMUNITY): Payer: Self-pay

## 2018-02-28 ENCOUNTER — Encounter (HOSPITAL_COMMUNITY): Payer: PPO

## 2018-02-28 ENCOUNTER — Ambulatory Visit (HOSPITAL_COMMUNITY)
Admission: RE | Admit: 2018-02-28 | Discharge: 2018-02-28 | Disposition: A | Discharge status: Home / Self Care | Payer: PPO | Source: Ambulatory Visit | Attending: Cardiology | Admitting: Cardiology

## 2018-02-28 ENCOUNTER — Encounter (HOSPITAL_COMMUNITY)
Admission: RE | Admit: 2018-02-28 | Discharge: 2018-02-28 | Disposition: A | Discharge status: Home / Self Care | Payer: PPO | Source: Ambulatory Visit | Attending: Internal Medicine | Admitting: Internal Medicine

## 2018-02-28 ENCOUNTER — Other Ambulatory Visit (HOSPITAL_COMMUNITY): Payer: Self-pay | Admitting: *Deleted

## 2018-02-28 ENCOUNTER — Ambulatory Visit (HOSPITAL_COMMUNITY): Payer: Self-pay | Admitting: Pharmacist

## 2018-02-28 ENCOUNTER — Other Ambulatory Visit (HOSPITAL_COMMUNITY): Payer: Self-pay | Admitting: Unknown Physician Specialty

## 2018-02-28 VITALS — BP 111/71 | HR 72 | Ht 70.0 in | Wt 217.0 lb

## 2018-02-28 DIAGNOSIS — N183 Chronic kidney disease, stage 3 (moderate): Secondary | ICD-10-CM | POA: Diagnosis not present

## 2018-02-28 DIAGNOSIS — I255 Ischemic cardiomyopathy: Secondary | ICD-10-CM | POA: Diagnosis not present

## 2018-02-28 DIAGNOSIS — E785 Hyperlipidemia, unspecified: Secondary | ICD-10-CM | POA: Insufficient documentation

## 2018-02-28 DIAGNOSIS — Z7982 Long term (current) use of aspirin: Secondary | ICD-10-CM | POA: Insufficient documentation

## 2018-02-28 DIAGNOSIS — I480 Paroxysmal atrial fibrillation: Secondary | ICD-10-CM | POA: Diagnosis not present

## 2018-02-28 DIAGNOSIS — Z7901 Long term (current) use of anticoagulants: Secondary | ICD-10-CM | POA: Diagnosis not present

## 2018-02-28 DIAGNOSIS — I5082 Biventricular heart failure: Secondary | ICD-10-CM | POA: Insufficient documentation

## 2018-02-28 DIAGNOSIS — Z95810 Presence of automatic (implantable) cardiac defibrillator: Secondary | ICD-10-CM | POA: Diagnosis not present

## 2018-02-28 DIAGNOSIS — E7800 Pure hypercholesterolemia, unspecified: Secondary | ICD-10-CM | POA: Diagnosis not present

## 2018-02-28 DIAGNOSIS — R42 Dizziness and giddiness: Secondary | ICD-10-CM

## 2018-02-28 DIAGNOSIS — Z880 Allergy status to penicillin: Secondary | ICD-10-CM | POA: Diagnosis not present

## 2018-02-28 DIAGNOSIS — Z794 Long term (current) use of insulin: Secondary | ICD-10-CM | POA: Insufficient documentation

## 2018-02-28 DIAGNOSIS — Z955 Presence of coronary angioplasty implant and graft: Secondary | ICD-10-CM | POA: Diagnosis not present

## 2018-02-28 DIAGNOSIS — E1122 Type 2 diabetes mellitus with diabetic chronic kidney disease: Secondary | ICD-10-CM | POA: Diagnosis not present

## 2018-02-28 DIAGNOSIS — I5022 Chronic systolic (congestive) heart failure: Secondary | ICD-10-CM

## 2018-02-28 DIAGNOSIS — Z95811 Presence of heart assist device: Secondary | ICD-10-CM

## 2018-02-28 DIAGNOSIS — D649 Anemia, unspecified: Secondary | ICD-10-CM | POA: Insufficient documentation

## 2018-02-28 DIAGNOSIS — Z79899 Other long term (current) drug therapy: Secondary | ICD-10-CM | POA: Insufficient documentation

## 2018-02-28 DIAGNOSIS — R1310 Dysphagia, unspecified: Secondary | ICD-10-CM | POA: Insufficient documentation

## 2018-02-28 DIAGNOSIS — N179 Acute kidney failure, unspecified: Secondary | ICD-10-CM | POA: Diagnosis not present

## 2018-02-28 DIAGNOSIS — I4891 Unspecified atrial fibrillation: Secondary | ICD-10-CM | POA: Diagnosis not present

## 2018-02-28 DIAGNOSIS — I2510 Atherosclerotic heart disease of native coronary artery without angina pectoris: Secondary | ICD-10-CM | POA: Insufficient documentation

## 2018-02-28 DIAGNOSIS — I130 Hypertensive heart and chronic kidney disease with heart failure and stage 1 through stage 4 chronic kidney disease, or unspecified chronic kidney disease: Secondary | ICD-10-CM | POA: Insufficient documentation

## 2018-02-28 LAB — PROTIME-INR
INR: 2.01
Prothrombin Time: 22.5 seconds — ABNORMAL HIGH (ref 11.4–15.2)

## 2018-02-28 LAB — BASIC METABOLIC PANEL
Anion gap: 10 (ref 5–15)
BUN: 17 mg/dL (ref 8–23)
CO2: 21 mmol/L — ABNORMAL LOW (ref 22–32)
Calcium: 9.2 mg/dL (ref 8.9–10.3)
Chloride: 106 mmol/L (ref 98–111)
Creatinine, Ser: 1.42 mg/dL — ABNORMAL HIGH (ref 0.61–1.24)
GFR calc Af Amer: 59 mL/min — ABNORMAL LOW (ref >60)
GFR calc non Af Amer: 51 mL/min — ABNORMAL LOW (ref >60)
Glucose, Bld: 140 mg/dL — ABNORMAL HIGH (ref 70–99)
Potassium: 4.8 mmol/L (ref 3.5–5.1)
Sodium: 137 mmol/L (ref 135–145)

## 2018-02-28 LAB — CBC
HEMATOCRIT: 39.8 % (ref 39.0–52.0)
Hemoglobin: 11.5 g/dL — ABNORMAL LOW (ref 13.0–17.0)
MCH: 22.7 pg — ABNORMAL LOW (ref 26.0–34.0)
MCHC: 28.9 g/dL — ABNORMAL LOW (ref 30.0–36.0)
MCV: 78.5 fL — ABNORMAL LOW (ref 80.0–100.0)
Platelets: 221 10*3/uL (ref 150–400)
RBC: 5.07 MIL/uL (ref 4.22–5.81)
RDW: 23.6 % — ABNORMAL HIGH (ref 11.5–15.5)
WBC: 4.7 10*3/uL (ref 4.0–10.5)
nRBC: 0 % (ref 0.0–0.2)

## 2018-02-28 LAB — GLUCOSE, CAPILLARY
Glucose-Capillary: 150 mg/dL — ABNORMAL HIGH (ref 70–99)
Glucose-Capillary: 121 mg/dL — ABNORMAL HIGH (ref 70–99)

## 2018-02-28 LAB — LACTATE DEHYDROGENASE: LDH: 200 U/L — ABNORMAL HIGH (ref 98–192)

## 2018-02-28 NOTE — Progress Notes (Signed)
Daily Session Note  Patient Details  Name: [name] [name] MRN: [id number] Date of Birth: 1950/08/23 Referring Provider:     CARDIAC REHAB PHASE II ORIENTATION from 12/09/2017 in [facility]  Referring Provider  Glori Bickers MD      Encounter Date: 02/28/2018   [name] [name] reported feeling dizzy while using hand weights and sat down.  MAP 84. VAD numbers: 5300 RPM, 3.7 Flow, 6.2 PI, 3.9 Power.  No audible alarms. HR 51 with pacing spikes. [name] [name] reports that it "felt like I was drunk and the chair was spinning."  CBG 121 after exercise.  Pt reports dizziness off and on for two days.  He reports that he has not consumed as much as ice as he normally does.  VAD coordinator paged and informed of pt's symptoms.  Called returned and requested for patient to be seen in the HF clinic as add on.  Pt taken to HF clinic.  Report given to VAD coordinator with printed strips from exercise session. [name], pt's wife, updated and met with [name] [name] in HF clinic.

## 2018-02-28 NOTE — Progress Notes (Signed)
Patient presents for sick visit in [facility] today. Reports no problems with VAD equipment or concerns with drive line.   Received page from cardiac rehab nurse stating that the pt became dizzy after exercise today. Pt states that he is eating more at home. He has gained 7lbs since he last visit which he states has been gradual as he getting a better appetite.     Vital Signs:  Doppler Pressure: 96 Automatc BP: 111/71 (86) HR: 65  SPO2: 98 %  Weight: 217lb w/o eqt  Last weight: 209.6lb w/o eqip Home weights: 211-215lbs   VAD Indication: Destination Therapy- Implanted 08/16/17   LVAD assessment: HM III: VAD Speed: 5300 rpms Flow: 3.8 Power: 3.9 w    PI: 6.2 Alarms: no clinical alarms  Events: 100+ today. Several were true suction events.   Fixed speed: 5300 Low speed limit: 5000  Primary: Back up battery expiring in 27 months. Secondary: Back up battery expiring in 30 months    I reviewed the LVAD parameters from today and compared the results to the patient's prior recorded data. LVAD interrogation was NEGATIVE for significant power changes, NEGATIVE for clinical alarms and STABLE for PI events/speed drops. Hematocrit was updated, but no other changes were made and pump is functioning within specified parameters. Pt is performing daily controller and system monitor self tests along with completing weekly and monthly maintenance for LVAD equipment.  LVAD equipment check completed and is in good working order. Back-up equipment present. Performed self-test on equipment.   Annual Equipment Maintenance on UBC/PM was performed on 09/17/17.   Exit Site Care: Continue twice a week dressing changes.   Significant Events on VAD Support:    Device: Medtronic 3 Lead Therapies: on Last check: 10/17/16  Labs:   Hgb 11.5 - No S/S of bleeding. Specifically denies melena/BRBPR or nosebleeds. States at times he has small amount bloody drainage when he blows his nose, but no  nosebleeds.   LDH stable at 200 and is within established baseline ranging from 170-250.  Denies tea-colored urine. No power elevations noted on interrogation.    Plan: 1. STOP Spironolactone.  2. Return to clinic in 2 weeks.   Zada Girt RN [place] Coordinator  Office: [phone]  24/7 Pager: [phone]

## 2018-02-28 NOTE — Progress Notes (Signed)
VAD Clinic Note   HF: Dr. [name]   HPI: [name] [name] is a 67 y.o. male with h/o chronic systolic CHF due to ICM, s/p LVAD HM3 08/16/2017, [name], [name], [name], [name], [name], HTN, HL, and CKD III.  Admitted 08/11/17 with recurrent A/C systolic CHF. Milrinone added, but mixed venous saturation remained low. Seen by CT surgery/VAD team and deemed appropriate for HMIII under DT criteria. He underwent HMIII on 6/17. Chest was unable to be closed due to high intrathoracic pressure and RV failure.  He returned to the OR on 6/18 for evacuation of hematoma. Chest was later closed on 6/20. Post operative course complicated by  open chest, RV failure, recurrent VT, respiratory failure, and dysphagia. CCM consulted for vent management with extubation completed on 08/26/17.  As he improved all drips gradually weaned off. Prior to d/c  ramp echo was completed with speed optimized.  Spent 7/11 - 7/27 in CIR. Underwent hydrotherapy for his buttock wound(s). 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   Seen on 12/10/17 for VT at 180s. ATP attempted x 4, and then shocked with return to NSR. Started amio 200 bid. Saw EP in f/u and said to continue amio 200 bid for 3-4 weeks then got to 200 daily.   He presents today as an add on from cardiac rehab for dizziness. Last visit his torsemide was stopped. It had been cut back the visit before with a 22 lb weight gain. He has felt somewhat better, but was very dizzy during cardiac rehab this am. He has not taken any torsemide. He has two "large" cups of ice a day, but otherwise drinks very little. Wife thinks he is afraid of fluid re-accumulating. He denies PND, orthopnea, or edema. Denies fevers, chills, or problems with drivelines. No bleeding, melena, or neuro symptoms.   ICD interrogation: Thoracic impedence just below threshold  since stopping torsemide. No AT/AF. Pt activity ~ 30 minutes a day. No VT/VF. Personally reviewed.   VAD Indication: Destination Therapy- Implanted 08/16/17   LVAD assessment: HM III: VAD Speed: 5300 rpms Flow: 3.8 Power: 3.9 w PI: 6.2 Alarms: No clinical alarms Events: 60+ today.   Fixed speed: 5300 Low speed limit: 5000  Primary: Back up battery expiring in 27 months. Secondary: Back up battery expiring in 30 months   Past Medical History:  Diagnosis Date  . AICD (automatic cardioverter/defibrillator) present 02/05/2014   Upgrade to Medtronic biventricular ICD, serial number  [id number]   . Atrial flutter ([place]) 04/2012   s/p TEE-EPS+RFCA 04/2012  . CAD (coronary artery disease) 4818,5631 X 2    RCA-T, 70% PL (off CFX), 99% Prox LAD/90% Dist LAD, S/P TAXUS stent x 2  . CHF (congestive heart failure) ([place])   . Chronic anticoagulation   . Chronic systolic heart failure ([place])   . CKD (chronic kidney disease)   . Diabetic retinopathy ([place])   . DM type 2 (diabetes mellitus, type 2) ([facility])    insulin dependent  . HTN (hypertension)   . Hypercholesteremia    ablation  . ICD (implantable cardiac defibrillator) in place   . Ischemic cardiomyopathy March 2015   20-25% 2D   . Nephrolithiasis   . Ventricular tachycardia ([place])     Current Outpatient Medications  Medication Sig Dispense Refill  . amiodarone (PACERONE) 200 MG tablet Take one tablet in the AM.  Take 1/2 tablet in the PM 150 tablet 3  . ascorbic acid (VITAMIN C) 500 MG tablet Take 1 tablet (500 mg total) by mouth 2 (two) times daily. 100 tablet 0  . aspirin 81 MG chewable tablet Chew 1 tablet (81 mg total) by mouth daily.    Marland Kitchen gabapentin (NEURONTIN) 100 MG capsule Take 2 in the morning 1 at lunch and 3 at bedtime. 540 capsule 3  . Insulin Pen Needle (COMFORT EZ PEN NEEDLES) 32G X 6 MM MISC 1 application by Does not apply route at bedtime. 100 each 0  . magnesium oxide (MAG-OX) 400 (241.3 Mg) MG tablet Take 1  tablet (400 mg total) by mouth 2 (two) times daily. 60 tablet 0  . Multiple Vitamin (MULTIVITAMIN WITH MINERALS) TABS tablet Take 1 tablet by mouth daily.    Marland Kitchen warfarin (COUMADIN) 2.5 MG tablet Take 2.5 mg by mouth daily.     . potassium chloride (K-DUR) 10 MEQ tablet Take 2 tablets (20 mEq total) by mouth as needed. Only take when you take Torsemide (Patient not taking: Reported on 02/28/2018) 90 tablet 3  . torsemide (DEMADEX) 20 MG tablet Take 1 tablet (20 mg total) by mouth as needed. Only take Torsemide if home weight is over 216lbs (Patient not taking: Reported on 02/28/2018) 90 tablet 2   No current facility-administered medications for this encounter.    Allergies  Allergen Reactions  . Penicillins Hives, Itching and Other (See Comments)    Has patient had a PCN reaction causing immediate rash, facial/tongue/throat swelling, SOB or lightheadedness with hypotension:# # Yes # # Has patient had a PCN reaction causing severe rash involving mucus membranes or skin necrosis: No Has patient had a PCN reaction that required hospitalization: No Has patient had a PCN reaction occurring within the last 10 years: No If all of the above answers are "NO", then may proceed with Cephalosporin use.    Review of systems complete and found to be negative unless listed in HPI.    Wt Readings from Last 3 Encounters:  02/28/18 98.4 kg (217 lb)  02/11/18 95.1 kg (209 lb 9.6 oz)  02/10/18 95.7 kg (211 lb)    Vitals:   02/28/18 1257 02/28/18 1258  BP: (!) 96/0 111/71  Pulse:  72  SpO2:  98%  Weight:  98.4 kg (217 lb)  Height:  5\' 10"  (1.778 m)   Vital Signs:  Doppler Pressure:96 Automatc BP: 111/71 (86) HR: 72 SPO2: 98%  Orthostatics Lying 110/51 (91) Sitting 95/55 (77) Standing 94/49 (80)  Weight: 217 lbs w/o eqt  Last weight: 213 lbs  Home weights: 211-215lbs   Physical Exam: General: Well appearing this am. NAD.  HEENT: Normal. Neck: Supple, JVP not elevated Carotids OK.    Cardiac:  Mechanical heart sounds with LVAD hum present.  Lungs:  CTAB, normal effort.  Abdomen:  NT, ND, no HSM. No bruits or masses. +BS  LVAD exit site: Well-healed and incorporated. Dressing dry and intact. No erythema or drainage. Stabilization device present and accurately applied. Driveline dressing changed daily per sterile technique. Extremities:  Warm and dry. No cyanosis, clubbing, rash, or edema.  Neuro:  Alert & oriented x 3. Cranial nerves grossly intact. Moves all 4 extremities w/o difficulty. Affect pleasant     ASSESSMENT AND PLAN:  1. Chronic systolic CHF with biventricular failure-> cardiogenic shock: - Echo 08/13/2017 EF 20-25%.s/p Medtronic BiV ICD in place. Cath 12/18 with stable 1v CAD. s/p HM-3 implant 08/16/17.  - Doing well.  NYHA II symptoms.  - He is dry on exam with + orthostatics - Continue to hold torsemide.  - Stop spiro for now.  - If persists with increased fluid in take and med adjustments, may need to adjust speed.  2. 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 - On aspirin and coumadin. Denies bleeding.  - INR goal 2-2.5 INR 2.01 today. Dosing per Pharm-D. - Ramp ECHO completed 09/09/2017 with speed increased to 5300.  - VAD interrogated personally. Parameters stable.   - LDH pending - Driveline site stable.  3.AKI on CKD III: - Creatinine improved to 1.56 at last visit. Follow today.  4. [name]/VF:  - ICD shock 12/09/17 with VT in 180s.  - No recent VT - Continue amiodarone 200 mg daily.  5. AFL/atrial fibrillation:  - S/p previous ablation. He is currently in NSR -Continue amio for VT.On coumadin as above.  - No change to current plan.   6. CAD s/p PCI of RCA and HKG:OVPCHE cath with stable CAD as above:  - No s/s of ischemia.    - Continue ASA and statin. Continue CR  7. [name]: Recent A1c8.3 on 6/3. - Per PCP.   8. Anemia:  - Hgb stable at 11.5.   Remains dry with + orthostasis. Meds adjusted as above. Encouraged po intake. If persists, may need  to adjust LVAD speed. RTC 2 weeks. Sooner with symptoms.   Shirley Friar, PA-C  02/28/18   Greater than 50% of the 35 minute visit was spent in counseling/coordination of care regarding disease state education, salt/fluid restriction, sliding scale diuretics, and medication compliance.

## 2018-03-03 ENCOUNTER — Other Ambulatory Visit: Payer: Self-pay | Admitting: *Deleted

## 2018-03-03 ENCOUNTER — Other Ambulatory Visit (HOSPITAL_COMMUNITY): Payer: Self-pay | Admitting: *Deleted

## 2018-03-03 DIAGNOSIS — Z95811 Presence of heart assist device: Secondary | ICD-10-CM

## 2018-03-03 DIAGNOSIS — Z7901 Long term (current) use of anticoagulants: Secondary | ICD-10-CM

## 2018-03-04 ENCOUNTER — Encounter (HOSPITAL_COMMUNITY): Payer: PPO

## 2018-03-04 ENCOUNTER — Encounter (HOSPITAL_COMMUNITY)
Admission: RE | Admit: 2018-03-04 | Discharge: 2018-03-04 | Disposition: A | Discharge status: Home / Self Care | Payer: PPO | Source: Ambulatory Visit | Attending: Internal Medicine | Admitting: Internal Medicine

## 2018-03-04 ENCOUNTER — Ambulatory Visit (HOSPITAL_COMMUNITY): Payer: Self-pay | Admitting: Pharmacist

## 2018-03-04 DIAGNOSIS — Z9889 Other specified postprocedural states: Secondary | ICD-10-CM | POA: Insufficient documentation

## 2018-03-04 DIAGNOSIS — Z87891 Personal history of nicotine dependence: Secondary | ICD-10-CM | POA: Insufficient documentation

## 2018-03-04 DIAGNOSIS — Z7901 Long term (current) use of anticoagulants: Secondary | ICD-10-CM | POA: Diagnosis not present

## 2018-03-04 DIAGNOSIS — I130 Hypertensive heart and chronic kidney disease with heart failure and stage 1 through stage 4 chronic kidney disease, or unspecified chronic kidney disease: Secondary | ICD-10-CM | POA: Diagnosis not present

## 2018-03-04 DIAGNOSIS — I5023 Acute on chronic systolic (congestive) heart failure: Secondary | ICD-10-CM | POA: Insufficient documentation

## 2018-03-04 DIAGNOSIS — Z7982 Long term (current) use of aspirin: Secondary | ICD-10-CM | POA: Insufficient documentation

## 2018-03-04 DIAGNOSIS — I5022 Chronic systolic (congestive) heart failure: Secondary | ICD-10-CM

## 2018-03-04 DIAGNOSIS — E1122 Type 2 diabetes mellitus with diabetic chronic kidney disease: Secondary | ICD-10-CM | POA: Insufficient documentation

## 2018-03-04 DIAGNOSIS — N189 Chronic kidney disease, unspecified: Secondary | ICD-10-CM | POA: Diagnosis not present

## 2018-03-04 DIAGNOSIS — Z95811 Presence of heart assist device: Secondary | ICD-10-CM | POA: Diagnosis not present

## 2018-03-04 DIAGNOSIS — Z79899 Other long term (current) drug therapy: Secondary | ICD-10-CM | POA: Insufficient documentation

## 2018-03-04 DIAGNOSIS — I255 Ischemic cardiomyopathy: Secondary | ICD-10-CM | POA: Insufficient documentation

## 2018-03-04 DIAGNOSIS — Z794 Long term (current) use of insulin: Secondary | ICD-10-CM | POA: Insufficient documentation

## 2018-03-04 DIAGNOSIS — E11319 Type 2 diabetes mellitus with unspecified diabetic retinopathy without macular edema: Secondary | ICD-10-CM | POA: Insufficient documentation

## 2018-03-04 DIAGNOSIS — E7800 Pure hypercholesterolemia, unspecified: Secondary | ICD-10-CM | POA: Insufficient documentation

## 2018-03-04 LAB — POCT INR: INR: 2.6 (ref 2–3)

## 2018-03-04 LAB — GLUCOSE, CAPILLARY: Glucose-Capillary: 198 mg/dL — ABNORMAL HIGH (ref 70–99)

## 2018-03-04 NOTE — Progress Notes (Signed)
No ICM remote transmission received for 03/01/2018 and next ICM transmission scheduled for 03/31/2018.    

## 2018-03-07 ENCOUNTER — Encounter (HOSPITAL_COMMUNITY): Payer: PPO

## 2018-03-07 ENCOUNTER — Encounter (HOSPITAL_COMMUNITY)
Admission: RE | Admit: 2018-03-07 | Discharge: 2018-03-07 | Disposition: A | Discharge status: Home / Self Care | Payer: PPO | Source: Ambulatory Visit | Attending: Internal Medicine | Admitting: Internal Medicine

## 2018-03-07 DIAGNOSIS — Z95811 Presence of heart assist device: Secondary | ICD-10-CM | POA: Diagnosis not present

## 2018-03-07 DIAGNOSIS — I5022 Chronic systolic (congestive) heart failure: Secondary | ICD-10-CM

## 2018-03-07 LAB — GLUCOSE, CAPILLARY: Glucose-Capillary: 265 mg/dL — ABNORMAL HIGH (ref 70–99)

## 2018-03-09 ENCOUNTER — Encounter (HOSPITAL_COMMUNITY): Payer: Self-pay

## 2018-03-09 ENCOUNTER — Encounter (HOSPITAL_COMMUNITY): Payer: PPO

## 2018-03-09 ENCOUNTER — Encounter (HOSPITAL_COMMUNITY)
Admission: RE | Admit: 2018-03-09 | Discharge: 2018-03-09 | Disposition: A | Discharge status: Home / Self Care | Payer: PPO | Source: Ambulatory Visit | Attending: Internal Medicine | Admitting: Internal Medicine

## 2018-03-09 DIAGNOSIS — I5022 Chronic systolic (congestive) heart failure: Secondary | ICD-10-CM

## 2018-03-09 DIAGNOSIS — Z95811 Presence of heart assist device: Secondary | ICD-10-CM

## 2018-03-09 NOTE — Progress Notes (Signed)
Daily Session Note  Patient Details  Name: [name] [name] MRN: [id number] Date of Birth: [birth year] Referring Provider:     CARDIAC REHAB PHASE II ORIENTATION from 12/09/2017 in [place]  Referring Provider  Glori Bickers MD      Encounter Date: 03/09/2018  Comments: Joylene Draft MAP was elevated after exercise, 112 via manual Doppler.  Automatic cuff pressure obtained for comparison.  BP 130/100 (106).  VAD coordinator paged.  No orders received.  Requested f/u on Friday during CR and if elevated to page again.  Josph Macho has an appointment with the HF clinic on 03/14/2018.  Pt will need to keep this appointment.  Pt updated and verbalized understanding.

## 2018-03-10 ENCOUNTER — Ambulatory Visit (HOSPITAL_COMMUNITY): Payer: Self-pay | Admitting: Pharmacist

## 2018-03-10 DIAGNOSIS — Z7901 Long term (current) use of anticoagulants: Secondary | ICD-10-CM

## 2018-03-10 LAB — POCT INR: INR: 2.4 (ref 2–3)

## 2018-03-10 NOTE — Progress Notes (Signed)
Cardiac Individual Treatment Plan  Patient Details  Name: [name] [name] MRN: [id number] Date of Birth: Dec 28, 1950 Referring Provider:     CARDIAC REHAB PHASE II ORIENTATION from 12/09/2017 in [place]  Referring Provider  Glori Bickers MD      Initial Encounter Date:    CARDIAC REHAB PHASE II ORIENTATION from 12/09/2017 in [facility]  Date  12/09/17      Visit Diagnosis: Heart failure, chronic systolic ([place])  Left ventricular assist device present ([place])  Patient's Home Medications on Admission:  Current Outpatient Medications:  .  amiodarone (PACERONE) 200 MG tablet, Take one tablet in the AM.  Take 1/2 tablet in the PM, Disp: 150 tablet, Rfl: 3 .  ascorbic acid (VITAMIN C) 500 MG tablet, Take 1 tablet (500 mg total) by mouth 2 (two) times daily., Disp: 100 tablet, Rfl: 0 .  aspirin 81 MG chewable tablet, Chew 1 tablet (81 mg total) by mouth daily., Disp: , Rfl:  .  gabapentin (NEURONTIN) 100 MG capsule, Take 2 in the morning 1 at lunch and 3 at bedtime., Disp: 540 capsule, Rfl: 3 .  Insulin Pen Needle (COMFORT EZ PEN NEEDLES) 32G X 6 MM MISC, 1 application by Does not apply route at bedtime., Disp: 100 each, Rfl: 0 .  magnesium oxide (MAG-OX) 400 (241.3 Mg) MG tablet, Take 1 tablet (400 mg total) by mouth 2 (two) times daily., Disp: 60 tablet, Rfl: 0 .  Multiple Vitamin (MULTIVITAMIN WITH MINERALS) TABS tablet, Take 1 tablet by mouth daily., Disp: , Rfl:  .  potassium chloride (K-DUR) 10 MEQ tablet, Take 2 tablets (20 mEq total) by mouth as needed. Only take when you take Torsemide (Patient not taking: Reported on 02/28/2018), Disp: 90 tablet, Rfl: 3 .  torsemide (DEMADEX) 20 MG tablet, Take 1 tablet (20 mg total) by mouth as needed. Only take Torsemide if home weight is over 216lbs (Patient not taking: Reported on 02/28/2018), Disp: 90 tablet, Rfl: 2 .  warfarin (COUMADIN) 2.5 MG tablet, Take 2.5 mg by mouth  daily. , Disp: , Rfl:   Past Medical History: Past Medical History:  Diagnosis Date  . AICD (automatic cardioverter/defibrillator) present 02/05/2014   Upgrade to [employer]onic biventricular ICD, serial number  [id number]   . Atrial flutter ([place]) 04/2012   s/p TEE-EPS+RFCA 04/2012  . CAD (coronary artery disease) 1115,5208 X 2    RCA-T, 70% PL (off CFX), 99% Prox LAD/90% Dist LAD, S/P TAXUS stent x 2  . CHF (congestive heart failure) ([place])   . Chronic anticoagulation   . Chronic systolic heart failure ([place])   . CKD (chronic kidney disease)   . Diabetic retinopathy ([place])   . DM type 2 (diabetes mellitus, type 2) ([facility])    insulin dependent  . HTN (hypertension)   . Hypercholesteremia    ablation  . ICD (implantable cardiac defibrillator) in place   . Ischemic cardiomyopathy March 2015   20-25% 2D   . Nephrolithiasis   . Ventricular tachycardia ([facility])     Tobacco Use: Social History   Tobacco Use  Smoking Status Former Smoker  . Packs/day: 1.00  . Years: 29.00  . Pack years: 29.00  . Types: Cigarettes  . Last attempt to quit: 03/02/2000  . Years since quitting: 18.0  Smokeless Tobacco Never Used    Labs: Recent Review Scientist, physiological    Labs for ITP Cardiac and Pulmonary Rehab Latest Ref Rng & Units 09/07/2017 09/08/2017 09/09/2017  11/11/2017 02/10/2018   Cholestrol <200 mg/dL - - - - -   LDLCALC <100 mg/dL - - - - -   LDLDIRECT mg/dL - - - - -   HDL >40 mg/dL - - - - -   Trlycerides <150 mg/dL - - - - -   Hemoglobin A1c 4.0 - 5.6 % - - - 6.3(A) 8.9(A)   PHART 7.350 - 7.450 - - - - -   PCO2ART 32.0 - 48.0 mmHg - - - - -   HCO3 20.0 - 28.0 mmol/L - - - - -   TCO2 22 - 32 mmol/L - - - - -   ACIDBASEDEF 0.0 - 2.0 mmol/L - - - - -   O2SAT % 54.4 60.9 52.1 - -      Capillary Blood Glucose: Lab Results  Component Value Date   GLUCAP 265 (H) 03/07/2018   GLUCAP 198 (H) 03/04/2018   GLUCAP 121 (H) 02/28/2018   GLUCAP 150 (H) 02/28/2018   GLUCAP 279 (H) 01/10/2018      Exercise Target Goals: Exercise Program Goal: Individual exercise prescription set using results from initial 6 min walk test and THRR while considering  patient's activity barriers and safety.   Exercise Prescription Goal: Initial exercise prescription builds to 30-45 minutes a day of aerobic activity, 2-3 days per week.  Home exercise guidelines will be given to patient during program as part of exercise prescription that the participant will acknowledge.  Activity Barriers & Risk Stratification: Activity Barriers & Cardiac Risk Stratification - 12/09/17 1131      Activity Barriers & Cardiac Risk Stratification   Activity Barriers  Deconditioning;Muscular Weakness    Cardiac Risk Stratification  High       6 Minute Walk: 6 Minute Walk    Row Name 12/09/17 1007 12/09/17 1127       6 Minute Walk   Phase  Initial  Initial    Distance  -  685 feet    Walk Time  -  4.15 minutes    # of Rest Breaks  -  1    MPH  -  1.29    METS  -  1.34    RPE  -  12    Perceived Dyspnea   -  0    VO2 Peak  -  4.7    Symptoms  -  Yes (comment)    Comments  -  Leg Fatigue     Resting HR  -  89 bpm    Resting BP  -  - MAP: 84    Resting Oxygen Saturation   -  96 %    Max Ex. HR  -  103 bpm    Max Ex. BP  -  - MAP: 88    2 Minute Post BP  -  - MAP: 78       Oxygen Initial Assessment:   Oxygen Re-Evaluation:   Oxygen Discharge (Final Oxygen Re-Evaluation):   Initial Exercise Prescription: Initial Exercise Prescription - 12/09/17 1100      Date of Initial Exercise RX and Referring Provider   Date  12/09/17    Referring Provider  Glori Bickers MD    Expected Discharge Date  03/18/18      NuStep   Level  1    SPM  75    Minutes  10    METs  1.5      Arm Ergometer   Level  1  Watts  10    Minutes  10    METs  1.5      Track   Laps  5    Minutes  10    METs  1.84      Prescription Details   Frequency (times per week)  3    Duration  Progress to 30 minutes  of continuous aerobic without signs/symptoms of physical distress      Intensity   THRR 40-80% of Max Heartrate  61-122    Ratings of Perceived Exertion  11-13    Perceived Dyspnea  0-4      Progression   Progression  Continue progressive overload as per policy without signs/symptoms or physical distress.      Resistance Training   Training Prescription  Yes    Weight  2    Reps  10-15       Perform Capillary Blood Glucose checks as needed.  Exercise Prescription Changes: Exercise Prescription Changes    Row Name 12/15/17 1300 12/29/17 1000 01/10/18 1400 01/19/18 1143 01/26/18 1028     Response to Exercise   Blood Pressure (Admit)  - Doppler:84  - MAP: 80  - MAP: 78  - MAP: 72  - MAP: 84   Blood Pressure (Exercise)  - Doppler:86  - MAP: 84  - MAP: 84  - MAP: 78  - MAP: 84   Blood Pressure (Exit)  - Doppler: 72  - MAP: 80  - MAP:92  - MAP: 82  - MAP: 80   Heart Rate (Admit)  76 bpm  68 bpm  68 bpm  60 bpm  70 bpm   Heart Rate (Exercise)  86 bpm  81 bpm  85 bpm  78 bpm  73 bpm   Heart Rate (Exit)  76 bpm  68 bpm  73 bpm  58 bpm  69 bpm   Rating of Perceived Exertion (Exercise)  '13  12  12  14  14   ' Perceived Dyspnea (Exercise)  0  0  0  0  0   Symptoms  None  None  None  None  None   Comments  Pt oriented to exercise equipment   None  None  None  None   Duration  Progress to 30 minutes of  aerobic without signs/symptoms of physical distress  Progress to 30 minutes of  aerobic without signs/symptoms of physical distress  Progress to 30 minutes of  aerobic without signs/symptoms of physical distress  Continue with 30 min of aerobic exercise without signs/symptoms of physical distress.  Continue with 30 min of aerobic exercise without signs/symptoms of physical distress.   Intensity  THRR unchanged  THRR unchanged  THRR unchanged  THRR unchanged  THRR unchanged     Progression   Progression  Continue to progress workloads to maintain intensity without signs/symptoms of physical  distress.  Continue to progress workloads to maintain intensity without signs/symptoms of physical distress.  Continue to progress workloads to maintain intensity without signs/symptoms of physical distress.  Continue to progress workloads to maintain intensity without signs/symptoms of physical distress.  Continue to progress workloads to maintain intensity without signs/symptoms of physical distress.   Average METs  1.65  1.79  2.09  2.02  2.02     Resistance Training   Training Prescription  No  Yes  Yes  No  No   Weight  -  2lbs  3lbs  -  -   Reps  -  10-15  10-15  -  -   Time  -  10 Minutes  10 Minutes  -  -     Interval Training   Interval Training  No  -  No  -  -     NuStep   Level  '1  1  3  3  3   ' SPM  75  75  85  85  85   Minutes  '10  10  10  10  10   ' METs  1.6  1.9  1.9  1.7  1.9     Arm Ergometer   Level  '1  1  1  1  1   ' Watts  '10  10  10  12  12   ' Minutes  '10  10  10  10  10   ' METs  1.5  1.6  1.6  1.8  1.6     Track   Laps  '4  5  9  9  9   ' Minutes  '10  10  10  10  10   ' METs  1.7  1.84  2.53  2.53  2.57     Home Exercise Plan   Plans to continue exercise at  -  -  -  Home (comment) Walking  Home (comment) Walking   Frequency  -  -  -  Add 2 additional days to program exercise sessions.  Add 2 additional days to program exercise sessions.   Initial Home Exercises Provided  -  -  -  01/14/18  01/14/18   Row Name 01/31/18 1326 02/07/18 1600 02/25/18 1025 03/09/18 1405       Response to Exercise   Blood Pressure (Admit)  - MAP: 86  - MAP: 86  - MAP: 82  - MAP: 80    Blood Pressure (Exercise)  - MAP: 92  - MAP: 82  - MAP: 80  - MAP: 92    Blood Pressure (Exit)  - MAP: 92  - MAP: 82  - MAP: 88  - MAP: 112    Heart Rate (Admit)  94 bpm  79 bpm  69 bpm  50 bpm    Heart Rate (Exercise)  993 bpm  77 bpm  77 bpm  95 bpm    Heart Rate (Exit)  77 bpm  67 bpm  69 bpm  50 bpm    Rating of Perceived Exertion (Exercise)  '13  13  13  13    ' Perceived Dyspnea (Exercise)  0  0  0   0    Symptoms  None  None  None  MAP elevated     Comments  None  None  None  VAD coordinator notified     Duration  Continue with 30 min of aerobic exercise without signs/symptoms of physical distress.  Progress to 30 minutes of  aerobic without signs/symptoms of physical distress  Progress to 30 minutes of  aerobic without signs/symptoms of physical distress  Progress to 30 minutes of  aerobic without signs/symptoms of physical distress    Intensity  THRR unchanged  THRR unchanged  THRR unchanged  THRR unchanged      Progression   Progression  Continue to progress workloads to maintain intensity without signs/symptoms of physical distress.  Continue to progress workloads to maintain intensity without signs/symptoms of physical distress.  Continue to progress workloads to maintain intensity without signs/symptoms of physical distress.  Continue to progress workloads to maintain intensity without signs/symptoms  of physical distress.    Average METs  2.2  2.12  2  2.5      Resistance Training   Training Prescription  No  Yes  Yes  No    Weight  -  4lbs  4lbs  -    Reps  -  10-15  10-15  -    Time  -  10 Minutes  10 Minutes  -      Interval Training   Interval Training  -  No  No  No      NuStep   Level  '3  3  3  3    ' SPM  85  95  95  95    Minutes  '10  10  10  15    ' METs  1.9  1.8  1.9  2.5      Arm Ergometer   Level  1  1  1.5  1.5    Watts  '12  12  12  12    ' Minutes  '10  10  10  15    ' METs  '2  2  2  2      ' Track   Laps  '9  9  9  ' -    Minutes  '10  10  10  ' -    METs  2.57  2.57  2.57  -      Home Exercise Plan   Plans to continue exercise at  Home (comment) Walking  Home (comment) Walking  Home (comment) Walking  Home (comment) Walking    Frequency  Add 2 additional days to program exercise sessions.  Add 2 additional days to program exercise sessions.  Add 2 additional days to program exercise sessions.  Add 2 additional days to program exercise sessions.    Initial Home  Exercises Provided  01/14/18  01/14/18  01/14/18  01/14/18       Exercise Comments: Exercise Comments    Row Name 12/15/17 1007 01/14/18 1354 02/10/18 1334 03/10/18 1409     Exercise Comments  Pt's first day of exericse. Pt responded well to exercise prescription. Will continue to monitor and progress pt.   Reviewed HEP with pt. Pt verbalized understanding and is currently walking for exericse. Will continue to follow up with pt regarding home exercise.   Reviewed METs and Goals with pt. Pt is not progressing with work levels, will continue to work with pt to improve cardiovascular strength.   Reviewed METs and Goals with pt. Pt has put more effort with exercise. Will follow up with pt regarding plans for exercise post rehab. Pt has about 4 more sessions left.       Exercise Goals and Review: Exercise Goals    Row Name 12/09/17 1131             Exercise Goals   Increase Physical Activity  Yes       Intervention  Provide advice, education, support and counseling about physical activity/exercise needs.;Develop an individualized exercise prescription for aerobic and resistive training based on initial evaluation findings, risk stratification, comorbidities and participant's personal goals.       Expected Outcomes  Short Term: Attend rehab on a regular basis to increase amount of physical activity.;Long Term: Add in home exercise to make exercise part of routine and to increase amount of physical activity.;Long Term: Exercising regularly at least 3-5 days a week.       Increase Strength and Stamina  Yes  Intervention  Provide advice, education, support and counseling about physical activity/exercise needs.;Develop an individualized exercise prescription for aerobic and resistive training based on initial evaluation findings, risk stratification, comorbidities and participant's personal goals.       Expected Outcomes  Short Term: Increase workloads from initial exercise prescription for  resistance, speed, and METs.;Short Term: Perform resistance training exercises routinely during rehab and add in resistance training at home;Long Term: Improve cardiorespiratory fitness, muscular endurance and strength as measured by increased METs and functional capacity (6MWT)       Able to understand and use rate of perceived exertion (RPE) scale  Yes       Intervention  Provide education and explanation on how to use RPE scale       Expected Outcomes  Short Term: Able to use RPE daily in rehab to express subjective intensity level;Long Term:  Able to use RPE to guide intensity level when exercising independently       Knowledge and understanding of Target Heart Rate Range (THRR)  Yes       Intervention  Provide education and explanation of THRR including how the numbers were predicted and where they are located for reference       Expected Outcomes  Short Term: Able to state/look up THRR;Short Term: Able to use daily as guideline for intensity in rehab;Long Term: Able to use THRR to govern intensity when exercising independently       Able to check pulse independently  Yes       Intervention  Provide education and demonstration on how to check pulse in carotid and radial arteries.;Review the importance of being able to check your own pulse for safety during independent exercise       Expected Outcomes  Short Term: Able to explain why pulse checking is important during independent exercise;Long Term: Able to check pulse independently and accurately       Understanding of Exercise Prescription  Yes       Intervention  Provide education, explanation, and written materials on patient's individual exercise prescription       Expected Outcomes  Short Term: Able to explain program exercise prescription;Long Term: Able to explain home exercise prescription to exercise independently          Exercise Goals Re-Evaluation : Exercise Goals Re-Evaluation    Row Name 01/14/18 1355 02/10/18 1338 03/10/18 1414          Exercise Goal Re-Evaluation   Exercise Goals Review  Increase Physical Activity;Able to understand and use rate of perceived exertion (RPE) scale;Knowledge and understanding of Target Heart Rate Range (THRR);Understanding of Exercise Prescription;Increase Strength and Stamina;Able to check pulse independently  Increase Physical Activity;Understanding of Exercise Prescription  Increase Physical Activity;Understanding of Exercise Prescription     Comments  Reviewed HEP with pt. Also reviewed THRR, RPE Scale, weather conditions, endpoints of exercise, NTG use, when to call Dr/911, warmup and cool down.   Reviewed METs and Goals. Pt is putting forth effort with exercise. Unable to increase pt's levels due to pt tolerance to increases. Will continue to work with pt to slowly progress pt.  Pt is continuing to respond well to exercise prescription. Pt states he is enjoying rehab and does not want it to end. Will continue to work with pt to increase MET level.      Expected Outcomes  Pt will continue to walk 5-7 days a week for 20 minutes. Pt will continue to increase cardiovascular strength. Pt states he is very  grateful for rehab. Will continue to monitor.   Pt will continue to walk for exercise. Pt will continue to improve stamina. Will progress pt as tolerated.   Pt is continuing to walk for exercise 2 days a week. Pt will continue to increase cardiovascular strength. Will continue to monitor.         Discharge Exercise Prescription (Final Exercise Prescription Changes): Exercise Prescription Changes - 03/09/18 1405      Response to Exercise   Blood Pressure (Admit)  --   MAP: 80   Blood Pressure (Exercise)  --   MAP: 92   Blood Pressure (Exit)  --   MAP: 112   Heart Rate (Admit)  50 bpm    Heart Rate (Exercise)  95 bpm    Heart Rate (Exit)  50 bpm    Rating of Perceived Exertion (Exercise)  13    Perceived Dyspnea (Exercise)  0    Symptoms  MAP elevated     Comments  VAD coordinator  notified     Duration  Progress to 30 minutes of  aerobic without signs/symptoms of physical distress    Intensity  THRR unchanged      Progression   Progression  Continue to progress workloads to maintain intensity without signs/symptoms of physical distress.    Average METs  2.5      Resistance Training   Training Prescription  No      Interval Training   Interval Training  No      NuStep   Level  3    SPM  95    Minutes  15    METs  2.5      Arm Ergometer   Level  1.5    Watts  12    Minutes  15    METs  2      Home Exercise Plan   Plans to continue exercise at  Home (comment)   Walking   Frequency  Add 2 additional days to program exercise sessions.    Initial Home Exercises Provided  01/14/18       Nutrition:  Target Goals: Understanding of nutrition guidelines, daily intake of sodium <1527m, cholesterol <2073m calories 30% from fat and 7% or less from saturated fats, daily to have 5 or more servings of fruits and vegetables.  Biometrics: Pre Biometrics - 12/09/17 1130      Pre Biometrics   Height  '5\' 10"'  (1.778 m)    Weight  106.9 kg    Waist Circumference  44 inches    Hip Circumference  44.5 inches    Waist to Hip Ratio  0.99 %    BMI (Calculated)  33.82    Triceps Skinfold  24 mm    % Body Fat  33.8 %    Grip Strength  33 kg    Flexibility  11 in    Single Leg Stand  4.77 seconds        Nutrition Therapy Plan and Nutrition Goals:   Nutrition Assessments:   Nutrition Goals Re-Evaluation:   Nutrition Goals Re-Evaluation:   Nutrition Goals Discharge (Final Nutrition Goals Re-Evaluation):   Psychosocial: Target Goals: Acknowledge presence or absence of significant depression and/or stress, maximize coping skills, provide positive support system. Participant is able to verbalize types and ability to use techniques and skills needed for reducing stress and depression.  Initial Review & Psychosocial Screening: Initial Psych Review &  Screening - 12/09/17 1213      Initial Review  Current issues with  Current Stress Concerns    Source of Stress Concerns  Chronic Illness      Family Dynamics   Good Support System?  Yes   Pt lists his wife [name] as a source of support. She is present with [name] [name] at the orientation appointment.      Barriers   Psychosocial barriers to participate in program  There are no identifiable barriers or psychosocial needs.      Screening Interventions   Interventions  Encouraged to exercise    Expected Outcomes  Short Term goal: Identification and review with participant of any Quality of Life or Depression concerns found by scoring the questionnaire.;Long Term goal: The participant improves quality of Life and PHQ9 Scores as seen by post scores and/or verbalization of changes       Quality of Life Scores: Quality of Life - 12/09/17 1141      Quality of Life   Select  Quality of Life      Quality of Life Scores   Health/Function Pre  17.71 %    Socioeconomic Pre  20.1 %    Psych/Spiritual Pre  22 %    Family Pre  22.8 %    GLOBAL Pre  19.89 %      Scores of 19 and below usually indicate a poorer quality of life in these areas.  A difference of  2-3 points is a clinically meaningful difference.  A difference of 2-3 points in the total score of the Quality of Life Index has been associated with significant improvement in overall quality of life, self-image, physical symptoms, and general health in studies assessing change in quality of life.  PHQ-9: Recent Review Flowsheet Data    Depression screen [facility] 2/9 12/15/2017 10/08/2017 09/29/2017   Decreased Interest 0 0 0   Down, Depressed, Hopeless 0 0 0   PHQ - 2 Score 0 0 0     Interpretation of Total Score  Total Score Depression Severity:  1-4 = Minimal depression, 5-9 = Mild depression, 10-14 = Moderate depression, 15-19 = Moderately severe depression, 20-27 = Severe depression   Psychosocial Evaluation and  Intervention: Psychosocial Evaluation - 12/15/17 1436      Psychosocial Evaluation & Interventions   Interventions  Encouraged to exercise with the program and follow exercise prescription;Stress management education;Relaxation education    Comments  [name] [name] has stress concerns related to his chronic illness and depending on others.  He is adjusting to his decrease in independence as he progresses.  He maintains a positive outlook and relies on his faith.     Expected Outcomes  [name] [name] will continue to maintain a positive outlook and utilize his faith when in need.    Continue Psychosocial Services   No Follow up required       Psychosocial Re-Evaluation: Psychosocial Re-Evaluation    [place] Name 12/30/17 1052 01/20/18 1220 02/10/18 1501 03/09/18 1704       Psychosocial Re-Evaluation   Current issues with  Current Stress Concerns  Current Stress Concerns  Current Stress Concerns  None Identified    Comments  [name] [name] has stress concerns related to his chronic illness but is coping well.  No current psychosocial needs identified.   [name] [name] has stress concerns related to his chronic illness but is coping well.  No current psychosocial needs identified.   [name] [name] does not report any current stress concerns.  No interventions necessary.   No psychosocial interventions necessary.    Expected Outcomes  [name] [name] will  continue to exhibit a positive outlook with good coping skills.  [name] [name] will continue to exhibit a positive outlook with good coping skills.  [name] [name] will continue to exhibit a positive outlook with good coping skills.  [name] [name] will continue to exhibit a positive outlook with good coping skills.    Interventions  Relaxation education;Stress management education;Encouraged to attend Cardiac Rehabilitation for the exercise  Relaxation education;Stress management education;Encouraged to attend Cardiac Rehabilitation for the exercise  Relaxation education;Stress management education;Encouraged to attend Cardiac  Rehabilitation for the exercise  Relaxation education;Stress management education;Encouraged to attend Cardiac Rehabilitation for the exercise    Continue Psychosocial Services   Follow up required by staff  Follow up required by staff  No Follow up required  No Follow up required       Psychosocial Discharge (Final Psychosocial Re-Evaluation): Psychosocial Re-Evaluation - 03/09/18 1704      Psychosocial Re-Evaluation   Current issues with  None Identified    Comments  No psychosocial interventions necessary.    Expected Outcomes  [name] [name] will continue to exhibit a positive outlook with good coping skills.    Interventions  Relaxation education;Stress management education;Encouraged to attend Cardiac Rehabilitation for the exercise    Continue Psychosocial Services   No Follow up required       Vocational Rehabilitation: Provide vocational rehab assistance to qualifying candidates.   Vocational Rehab Evaluation & Intervention: Vocational Rehab - 12/09/17 1211      Initial Vocational Rehab Evaluation & Intervention   Assessment shows need for Vocational Rehabilitation  No       Education: Education Goals: Education classes will be provided on a weekly basis, covering required topics. Participant will state understanding/return demonstration of topics presented.  Learning Barriers/Preferences: Learning Barriers/Preferences - 12/09/17 1142      Learning Barriers/Preferences   Learning Barriers  Sight    Learning Preferences  Written Material;Individual Instruction       Education Topics: Count Your Pulse:  -Group instruction provided by verbal instruction, demonstration, patient participation and written materials to support subject.  Instructors address importance of being able to find your pulse and how to count your pulse when at home without a heart monitor.  Patients get hands on experience counting their pulse with staff help and individually.   Heart Attack, Angina, and  Risk Factor Modification:  -Group instruction provided by verbal instruction, video, and written materials to support subject.  Instructors address signs and symptoms of angina and heart attacks.    Also discuss risk factors for heart disease and how to make changes to improve heart health risk factors.   CARDIAC REHAB PHASE II EXERCISE from 03/09/2018 in [place]  Date  02/09/18  Educator  RN  Instruction Review Code  2- Demonstrated Understanding      Functional Fitness:  -Group instruction provided by verbal instruction, demonstration, patient participation, and written materials to support subject.  Instructors address safety measures for doing things around the house.  Discuss how to get up and down off the floor, how to pick things up properly, how to safely get out of a chair without assistance, and balance training.   CARDIAC REHAB PHASE II EXERCISE from 03/09/2018 in [place]  Date  01/14/18  Instruction Review Code  2- Demonstrated Understanding      Meditation and Mindfulness:  -Group instruction provided by verbal instruction, patient participation, and written materials to support subject.  Instructor addresses importance of mindfulness and meditation  practice to help reduce stress and improve awareness.  Instructor also leads participants through a meditation exercise.    Stretching for Flexibility and Mobility:  -Group instruction provided by verbal instruction, patient participation, and written materials to support subject.  Instructors lead participants through series of stretches that are designed to increase flexibility thus improving mobility.  These stretches are additional exercise for major muscle groups that are typically performed during regular warm up and cool down.   Hands Only CPR:  -Group verbal, video, and participation provides a basic overview of AHA guidelines for community CPR. Role-play of  emergencies allow participants the opportunity to practice calling for help and chest compression technique with discussion of AED use.   Hypertension: -Group verbal and written instruction that provides a basic overview of hypertension including the most recent diagnostic guidelines, risk factor reduction with self-care instructions and medication management.    Nutrition I class: Heart Healthy Eating:  -Group instruction provided by PowerPoint slides, verbal discussion, and written materials to support subject matter. The instructor gives an explanation and review of the Therapeutic Lifestyle Changes diet recommendations, which includes a discussion on lipid goals, dietary fat, sodium, fiber, plant stanol/sterol esters, sugar, and the components of a well-balanced, healthy diet.   Nutrition II class: Lifestyle Skills:  -Group instruction provided by PowerPoint slides, verbal discussion, and written materials to support subject matter. The instructor gives an explanation and review of label reading, grocery shopping for heart health, heart healthy recipe modifications, and ways to make healthier choices when eating out.   Diabetes Question & Answer:  -Group instruction provided by PowerPoint slides, verbal discussion, and written materials to support subject matter. The instructor gives an explanation and review of diabetes co-morbidities, pre- and post-prandial blood glucose goals, pre-exercise blood glucose goals, signs, symptoms, and treatment of hypoglycemia and hyperglycemia, and foot care basics.   Diabetes Blitz:  -Group instruction provided by PowerPoint slides, verbal discussion, and written materials to support subject matter. The instructor gives an explanation and review of the physiology behind type 1 and type 2 diabetes, diabetes medications and rational behind using different medications, pre- and post-prandial blood glucose recommendations and Hemoglobin A1c goals, diabetes  diet, and exercise including blood glucose guidelines for exercising safely.    Portion Distortion:  -Group instruction provided by PowerPoint slides, verbal discussion, written materials, and food models to support subject matter. The instructor gives an explanation of serving size versus portion size, changes in portions sizes over the last 20 years, and what consists of a serving from each food group.   Stress Management:  -Group instruction provided by verbal instruction, video, and written materials to support subject matter.  Instructors review role of stress in heart disease and how to cope with stress positively.     CARDIAC REHAB PHASE II EXERCISE from 03/09/2018 in [place]  Date  01/05/18  Instruction Review Code  2- Demonstrated Understanding      Exercising on Your Own:  -Group instruction provided by verbal instruction, power point, and written materials to support subject.  Instructors discuss benefits of exercise, components of exercise, frequency and intensity of exercise, and end points for exercise.  Also discuss use of nitroglycerin and activating EMS.  Review options of places to exercise outside of rehab.  Review guidelines for sex with heart disease.   CARDIAC REHAB PHASE II EXERCISE from 03/09/2018 in [place]  Date  02/16/18  Educator  EP  Instruction Review  Code  2- Demonstrated Understanding      Cardiac Drugs I:  -Group instruction provided by verbal instruction and written materials to support subject.  Instructor reviews cardiac drug classes: antiplatelets, anticoagulants, beta blockers, and statins.  Instructor discusses reasons, side effects, and lifestyle considerations for each drug class.   CARDIAC REHAB PHASE II EXERCISE from 03/09/2018 in [place]  Date  03/09/18  Educator  Pharmcist  Instruction Review Code  2- Demonstrated Understanding      Cardiac  Drugs II:  -Group instruction provided by verbal instruction and written materials to support subject.  Instructor reviews cardiac drug classes: angiotensin converting enzyme inhibitors (ACE-I), angiotensin II receptor blockers (ARBs), nitrates, and calcium channel blockers.  Instructor discusses reasons, side effects, and lifestyle considerations for each drug class.   CARDIAC REHAB PHASE II EXERCISE from 03/09/2018 in [place]  Date  02/02/18  Educator  PharmD  Instruction Review Code  2- Demonstrated Understanding      Anatomy and Physiology of the Circulatory System:  Group verbal and written instruction and models provide basic cardiac anatomy and physiology, with the coronary electrical and arterial systems. Review of: AMI, Angina, Valve disease, Heart Failure, Peripheral Artery Disease, Cardiac Arrhythmia, Pacemakers, and the ICD.   CARDIAC REHAB PHASE II EXERCISE from 03/09/2018 in [place]  Date  01/26/18  Educator  RN  Instruction Review Code  2- Demonstrated Understanding      Other Education:  -Group or individual verbal, written, or video instructions that support the educational goals of the cardiac rehab program.   Holiday Eating Survival Tips:  -Group instruction provided by PowerPoint slides, verbal discussion, and written materials to support subject matter. The instructor gives patients tips, tricks, and techniques to help them not only survive but enjoy the holidays despite the onslaught of food that accompanies the holidays.   Knowledge Questionnaire Score: Knowledge Questionnaire Score - 12/09/17 1143      Knowledge Questionnaire Score   Pre Score  19/24       Core Components/Risk Factors/Patient Goals at Admission: Personal Goals and Risk Factors at Admission - 12/09/17 1144      Core Components/Risk Factors/Patient Goals on Admission    Weight Management  Yes;Weight Loss    Intervention   Weight Management: Develop a combined nutrition and exercise program designed to reach desired caloric intake, while maintaining appropriate intake of nutrient and fiber, sodium and fats, and appropriate energy expenditure required for the weight goal.;Weight Management: Provide education and appropriate resources to help participant work on and attain dietary goals.;Weight Management/Obesity: Establish reasonable short term and long term weight goals.;Obesity: Provide education and appropriate resources to help participant work on and attain dietary goals.    Admit Weight  235 lb 10.8 oz (106.9 kg)    Goal Weight: Long Term  225 lb (102.1 kg)    Expected Outcomes  Short Term: Continue to assess and modify interventions until short term weight is achieved;Long Term: Adherence to nutrition and physical activity/exercise program aimed toward attainment of established weight goal;Weight Loss: Understanding of general recommendations for a balanced deficit meal plan, which promotes 1-2 lb weight loss per week and includes a negative energy balance of [phone] kcal/d;Understanding recommendations for meals to include 15-35% energy as protein, 25-35% energy from fat, 35-60% energy from carbohydrates, less than 274m of dietary cholesterol, 20-35 gm of total fiber daily;Understanding of distribution of calorie intake throughout the day with the  consumption of 4-5 meals/snacks    Diabetes  Yes    Intervention  Provide education about signs/symptoms and action to take for hypo/hyperglycemia.;Provide education about proper nutrition, including hydration, and aerobic/resistive exercise prescription along with prescribed medications to achieve blood glucose in normal ranges: Fasting glucose 65-99 mg/dL    Expected Outcomes  Short Term: Participant verbalizes understanding of the signs/symptoms and immediate care of hyper/hypoglycemia, proper foot care and importance of medication, aerobic/resistive exercise and nutrition  plan for blood glucose control.;Long Term: Attainment of HbA1C < 7%.    Heart Failure  Yes    Intervention  Provide a combined exercise and nutrition program that is supplemented with education, support and counseling about heart failure. Directed toward relieving symptoms such as shortness of breath, decreased exercise tolerance, and extremity edema.    Expected Outcomes  Improve functional capacity of life;Short term: Attendance in program 2-3 days a week with increased exercise capacity. Reported lower sodium intake. Reported increased fruit and vegetable intake. Reports medication compliance.;Short term: Daily weights obtained and reported for increase. Utilizing diuretic protocols set by physician.;Long term: Adoption of self-care skills and reduction of barriers for early signs and symptoms recognition and intervention leading to self-care maintenance.    Hypertension  Yes    Intervention  Provide education on lifestyle modifcations including regular physical activity/exercise, weight management, moderate sodium restriction and increased consumption of fresh fruit, vegetables, and low fat dairy, alcohol moderation, and smoking cessation.;Monitor prescription use compliance.    Expected Outcomes  Short Term: Continued assessment and intervention until BP is < 140/24m HG in hypertensive participants. < 130/89mHG in hypertensive participants with diabetes, heart failure or chronic kidney disease.;Long Term: Maintenance of blood pressure at goal levels.    Lipids  Yes    Intervention  Provide education and support for participant on nutrition & aerobic/resistive exercise along with prescribed medications to achieve LDL <7073mHDL >72m59m  Expected Outcomes  Short Term: Participant states understanding of desired cholesterol values and is compliant with medications prescribed. Participant is following exercise prescription and nutrition guidelines.;Long Term: Cholesterol controlled with medications as  prescribed, with individualized exercise RX and with personalized nutrition plan. Value goals: LDL < 70mg43mL > 40 mg.       Core Components/Risk Factors/Patient Goals Review:  Goals and Risk Factor Review    Row Name 12/15/17 1440 12/30/17 1053 01/20/18 1220 02/10/18 1501 03/09/18 1704     Core Components/Risk Factors/Patient Goals Review   Personal Goals Review  Weight Management/Obesity;Lipids;Heart Failure;Hypertension;Diabetes  Weight Management/Obesity;Lipids;Heart Failure;Hypertension;Diabetes  Weight Management/Obesity;Lipids;Heart Failure;Hypertension;Diabetes  Weight Management/Obesity;Lipids;Heart Failure;Hypertension;Diabetes  Weight Management/Obesity;Lipids;Heart Failure;Hypertension;Diabetes   Review  Pt presents to cardiac rehab willing to participate.  [name] [name] Machod like increase his strength and stamina.   Pt presents to cardiac rehab willing to participate.  [name] [name] Machostarted exercise and is tolerating it very well.  He enjoys CR.   Pt with multiple RF willing to participate in CR.  [name] [name] Machoinues to tolerate exercise.  He feels that he is gaining strength and enjoys coming to CR.   Pt with multiple RF willing to participate in CR.  [name] [name] Machoinues to tolerate exercise though his MET levels are not progressing.  He continues to enjoy coming to CR.   Pt with multiple RF willing to participate in CR. 30 Day ITP Review.  [name] [name] Machoexperienced some BP issues during CR.  He was dizzy during one session and required f/u with MD where medication changes were made.  Other days  [name] [name] has been tolerating exercise well.  METs are progressing some. [name] [name] enjoys participating in program.    Expected Outcomes  Pt will continue to participate in CR exercise, nutrition, and lifestyle modification opportunities.   Pt will continue to participate in CR exercise, nutrition, and lifestyle modification opportunities.   Pt will continue to participate in CR exercise, nutrition, and lifestyle modification  opportunities.   Pt will continue to participate in CR exercise, nutrition, and lifestyle modification opportunities.   Pt will continue to participate in CR exercise, nutrition, and lifestyle modification opportunities.       Core Components/Risk Factors/Patient Goals at Discharge (Final Review):  Goals and Risk Factor Review - 03/09/18 1704      Core Components/Risk Factors/Patient Goals Review   Personal Goals Review  Weight Management/Obesity;Lipids;Heart Failure;Hypertension;Diabetes    Review  Pt with multiple RF willing to participate in CR. 30 Day ITP Review.  [name] [name] has experienced some BP issues during CR.  He was dizzy during one session and required f/u with MD where medication changes were made.  Other days [name] [name] has been tolerating exercise well.  METs are progressing some. [name] [name] enjoys participating in program.     Expected Outcomes  Pt will continue to participate in CR exercise, nutrition, and lifestyle modification opportunities.        ITP Comments: ITP Comments    Row Name 12/09/17 1126 12/15/17 1432 12/30/17 1051 01/20/18 1218 02/10/18 1457   ITP Comments  Dr. Fransico Him, Medical Director   [name] [name] started exercise today and tolerated it well.   30 Day ITP Review.  [name] [name] has started exercise recently and is tolerating is very well.  He enjoys coming to CR and is learning the routine.   30 Day ITP Review.  [name] [name] is tolerating exercise well.  He feels that he is increasing his strength and enjoys coming to CR.   30 Day ITP Review.  Pt is tolerating exercise well though not progressing MET levels.  He has experienced over 20lbs of weight loss.    [place] Name 03/09/18 1702           ITP Comments  30 Day ITP Review.  [name] [name] has experienced some BP issues during CR.  He was dizzy during one session and required f/u with MD where medication changes were made.  Other days [name] [name] has been tolerating exercise well.  METs are progressing some.           Comments: See ITP Comments.

## 2018-03-10 NOTE — Patient Instructions (Signed)
Patient instructed to continue current regimen.

## 2018-03-11 ENCOUNTER — Encounter (HOSPITAL_COMMUNITY)
Admission: RE | Admit: 2018-03-11 | Discharge: 2018-03-11 | Disposition: A | Discharge status: Home / Self Care | Payer: PPO | Source: Ambulatory Visit | Attending: Internal Medicine | Admitting: Internal Medicine

## 2018-03-11 ENCOUNTER — Encounter (HOSPITAL_COMMUNITY): Payer: PPO

## 2018-03-11 VITALS — Ht 70.0 in | Wt 214.1 lb

## 2018-03-11 DIAGNOSIS — I5022 Chronic systolic (congestive) heart failure: Secondary | ICD-10-CM

## 2018-03-11 DIAGNOSIS — Z95811 Presence of heart assist device: Secondary | ICD-10-CM

## 2018-03-14 ENCOUNTER — Encounter (HOSPITAL_COMMUNITY): Payer: PPO

## 2018-03-14 ENCOUNTER — Encounter (HOSPITAL_COMMUNITY)
Admission: RE | Admit: 2018-03-14 | Discharge: 2018-03-14 | Disposition: A | Discharge status: Home / Self Care | Payer: PPO | Source: Ambulatory Visit | Attending: Internal Medicine | Admitting: Internal Medicine

## 2018-03-14 ENCOUNTER — Ambulatory Visit (HOSPITAL_COMMUNITY): Payer: Self-pay | Admitting: Pharmacist

## 2018-03-14 ENCOUNTER — Other Ambulatory Visit (HOSPITAL_COMMUNITY): Payer: Self-pay | Admitting: *Deleted

## 2018-03-14 ENCOUNTER — Encounter (HOSPITAL_COMMUNITY): Payer: Self-pay

## 2018-03-14 ENCOUNTER — Ambulatory Visit (HOSPITAL_COMMUNITY)
Admission: RE | Admit: 2018-03-14 | Discharge: 2018-03-14 | Disposition: A | Discharge status: Home / Self Care | Payer: PPO | Source: Ambulatory Visit | Attending: Internal Medicine | Admitting: Internal Medicine

## 2018-03-14 VITALS — BP 110/73 | HR 71 | Ht 70.0 in | Wt 221.4 lb

## 2018-03-14 DIAGNOSIS — I480 Paroxysmal atrial fibrillation: Secondary | ICD-10-CM

## 2018-03-14 DIAGNOSIS — Z7901 Long term (current) use of anticoagulants: Secondary | ICD-10-CM

## 2018-03-14 DIAGNOSIS — I5082 Biventricular heart failure: Secondary | ICD-10-CM | POA: Insufficient documentation

## 2018-03-14 DIAGNOSIS — Z95810 Presence of automatic (implantable) cardiac defibrillator: Secondary | ICD-10-CM | POA: Insufficient documentation

## 2018-03-14 DIAGNOSIS — E7800 Pure hypercholesterolemia, unspecified: Secondary | ICD-10-CM | POA: Diagnosis not present

## 2018-03-14 DIAGNOSIS — I739 Peripheral vascular disease, unspecified: Secondary | ICD-10-CM | POA: Insufficient documentation

## 2018-03-14 DIAGNOSIS — Z95811 Presence of heart assist device: Secondary | ICD-10-CM

## 2018-03-14 DIAGNOSIS — Z79899 Other long term (current) drug therapy: Secondary | ICD-10-CM | POA: Insufficient documentation

## 2018-03-14 DIAGNOSIS — Z7982 Long term (current) use of aspirin: Secondary | ICD-10-CM | POA: Insufficient documentation

## 2018-03-14 DIAGNOSIS — Z955 Presence of coronary angioplasty implant and graft: Secondary | ICD-10-CM | POA: Insufficient documentation

## 2018-03-14 DIAGNOSIS — E1122 Type 2 diabetes mellitus with diabetic chronic kidney disease: Secondary | ICD-10-CM | POA: Insufficient documentation

## 2018-03-14 DIAGNOSIS — D649 Anemia, unspecified: Secondary | ICD-10-CM | POA: Insufficient documentation

## 2018-03-14 DIAGNOSIS — I472 Ventricular tachycardia: Secondary | ICD-10-CM | POA: Diagnosis not present

## 2018-03-14 DIAGNOSIS — Z794 Long term (current) use of insulin: Secondary | ICD-10-CM | POA: Insufficient documentation

## 2018-03-14 DIAGNOSIS — I5022 Chronic systolic (congestive) heart failure: Secondary | ICD-10-CM

## 2018-03-14 DIAGNOSIS — N183 Chronic kidney disease, stage 3 (moderate): Secondary | ICD-10-CM | POA: Diagnosis not present

## 2018-03-14 DIAGNOSIS — I4891 Unspecified atrial fibrillation: Secondary | ICD-10-CM | POA: Insufficient documentation

## 2018-03-14 DIAGNOSIS — E11319 Type 2 diabetes mellitus with unspecified diabetic retinopathy without macular edema: Secondary | ICD-10-CM | POA: Insufficient documentation

## 2018-03-14 DIAGNOSIS — I255 Ischemic cardiomyopathy: Secondary | ICD-10-CM | POA: Insufficient documentation

## 2018-03-14 DIAGNOSIS — I4892 Unspecified atrial flutter: Secondary | ICD-10-CM | POA: Diagnosis not present

## 2018-03-14 DIAGNOSIS — I2510 Atherosclerotic heart disease of native coronary artery without angina pectoris: Secondary | ICD-10-CM | POA: Diagnosis not present

## 2018-03-14 DIAGNOSIS — I130 Hypertensive heart and chronic kidney disease with heart failure and stage 1 through stage 4 chronic kidney disease, or unspecified chronic kidney disease: Secondary | ICD-10-CM | POA: Diagnosis not present

## 2018-03-14 DIAGNOSIS — R1310 Dysphagia, unspecified: Secondary | ICD-10-CM | POA: Insufficient documentation

## 2018-03-14 DIAGNOSIS — I5043 Acute on chronic combined systolic (congestive) and diastolic (congestive) heart failure: Secondary | ICD-10-CM

## 2018-03-14 LAB — BASIC METABOLIC PANEL
Anion gap: 7 (ref 5–15)
BUN: 23 mg/dL (ref 8–23)
CO2: 22 mmol/L (ref 22–32)
CREATININE: 1.19 mg/dL (ref 0.61–1.24)
Calcium: 9.2 mg/dL (ref 8.9–10.3)
Chloride: 107 mmol/L (ref 98–111)
GFR calc Af Amer: >60 mL/min (ref >60)
GFR calc non Af Amer: >60 mL/min (ref >60)
Glucose, Bld: 85 mg/dL (ref 70–99)
Potassium: 5.1 mmol/L (ref 3.5–5.1)
Sodium: 136 mmol/L (ref 135–145)

## 2018-03-14 LAB — GLUCOSE, CAPILLARY
Glucose-Capillary: 73 mg/dL (ref 70–99)
Glucose-Capillary: 97 mg/dL (ref 70–99)

## 2018-03-14 LAB — CBC
HCT: 40.7 % (ref 39.0–52.0)
Hemoglobin: 12.1 g/dL — ABNORMAL LOW (ref 13.0–17.0)
MCH: 23.8 pg — ABNORMAL LOW (ref 26.0–34.0)
MCHC: 29.7 g/dL — ABNORMAL LOW (ref 30.0–36.0)
MCV: 80.1 fL (ref 80.0–100.0)
PLATELETS: 191 10*3/uL (ref 150–400)
RBC: 5.08 MIL/uL (ref 4.22–5.81)
RDW: 23.4 % — AB (ref 11.5–15.5)
WBC: 5.2 10*3/uL (ref 4.0–10.5)
nRBC: 0 % (ref 0.0–0.2)

## 2018-03-14 LAB — PROTIME-INR
INR: 2.37
Prothrombin Time: 25.6 seconds — ABNORMAL HIGH (ref 11.4–15.2)

## 2018-03-14 LAB — LACTATE DEHYDROGENASE: LDH: 232 U/L — ABNORMAL HIGH (ref 98–192)

## 2018-03-14 NOTE — Patient Instructions (Signed)
1. No changes in meds. 2. Lattie Haw will call you with INR and coumdain instructions. 3. Return to [place] clinic in 6 weeks.

## 2018-03-14 NOTE — Progress Notes (Signed)
Incomplete Session Note  Patient Details  Name: [name] [name] MRN: [id number] Date of Birth: 01/08/1951 Referring Provider:     CARDIAC REHAB PHASE II ORIENTATION from 12/09/2017 in  [facility]  Referring Provider  Bensimhon, Daniel MD      Quinton L Fryman did not complete his rehab session.  [name]'s CBG was 73 prior to exercise. Pt given lemonade and graham crackers and CBG increased to 97. Pt with inadequate meal intake for breakfast.  Pt educated on eating both a carbohydrate choice and protein for breakfast.  Met with dietitian to discuss this as well.  

## 2018-03-14 NOTE — Progress Notes (Signed)
VAD Clinic Note   HF: Dr. [name]   HPI: [name] [name] is a 68 y.o. male with h/o chronic systolic CHF due to ICM, s/p LVAD HM3 08/16/2017, [name], [name], [name], [name], [name], HTN, HL, and CKD III.  Admitted 08/11/17 with recurrent A/C systolic CHF. Milrinone added, but mixed venous saturation remained low. Seen by CT surgery/VAD team and deemed appropriate for HMIII under DT criteria. He underwent HMIII on 6/17. Chest was unable to be closed due to high intrathoracic pressure and RV failure.  He returned to the OR on 6/18 for evacuation of hematoma. Chest was later closed on 6/20. Post operative course complicated by  open chest, RV failure, recurrent VT, respiratory failure, and dysphagia. CCM consulted for vent management with extubation completed on 08/26/17.  As he improved all drips gradually weaned off. Prior to d/c  ramp echo was completed with speed optimized.  Spent 7/11 - 7/27 in CIR. Underwent hydrotherapy for his buttock wound(s). 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   Seen on 12/10/17 for VT at 180s. ATP attempted x 4, and then shocked with return to NSR. Started amio 200 bid. Saw EP in f/u and said to continue amio 200 bid for 3-4 weeks then got to 200 daily.   He presents today for routine VAD f/u. Doing very well. Says he feels stronger every day. Going to CR 3x/week and increasing his activity level. MAP got up to 110 at end of exercise session the other day. Not taking diuretics. Denies orthopnea or PND. No fevers, chills or problems with driveline. No bleeding, melena or neuro symptoms. No VAD alarms. Taking all meds as prescribed. Wife changing dressing 2x/week. Mild drainage from site.   VAD Indication: Destination Therapy- Implanted 08/16/17   LVAD assessment: HM III: VAD Speed: 5300 rpms Flow: 3.6 Power: 3.9 w PI:  6.2 Alarms: no clinical alarms Events: > 85 on 03/13/17 Hct: 26  Fixed speed: 5300 Low speed limit: 5000  Primary: Back up battery expiring in 26 months. Secondary: Back up battery expiring in 29 months   Past Medical History:  Diagnosis Date  . AICD (automatic cardioverter/defibrillator) present 02/05/2014   Upgrade to Medtronic biventricular ICD, serial number  [id number]   . Atrial flutter ([place]) 04/2012   s/p TEE-EPS+RFCA 04/2012  . CAD (coronary artery disease) 3825,0539 X 2    RCA-T, 70% PL (off CFX), 99% Prox LAD/90% Dist LAD, S/P TAXUS stent x 2  . CHF (congestive heart failure) ([place])   . Chronic anticoagulation   . Chronic systolic heart failure ([place])   . CKD (chronic kidney disease)   . Diabetic retinopathy ([place])   . DM type 2 (diabetes mellitus, type 2) (HCC)    insulin dependent  . HTN (hypertension)   . Hypercholesteremia    ablation  . ICD (implantable cardiac defibrillator) in place   . Ischemic cardiomyopathy March 2015   20-25% 2D   . Nephrolithiasis   . Ventricular tachycardia ([place])     Current Outpatient Medications  Medication Sig Dispense Refill  . amiodarone (PACERONE) 200 MG tablet Take one tablet in the AM.  Take 1/2 tablet in the PM 150 tablet 3  . ascorbic acid (VITAMIN C) 500 MG tablet Take 1 tablet (500 mg total) by mouth 2 (two) times daily. 100 tablet 0  . aspirin 81 MG chewable tablet  Chew 1 tablet (81 mg total) by mouth daily.    Marland Kitchen gabapentin (NEURONTIN) 100 MG capsule Take 2 in the morning 1 at lunch and 3 at bedtime. 540 capsule 3  . insulin NPH-regular Human (70-30) 100 UNIT/ML injection Inject 30 Units into the skin daily.    . Insulin Pen Needle (COMFORT EZ PEN NEEDLES) 32G X 6 MM MISC 1 application by Does not apply route at bedtime. 100 each 0  . magnesium oxide (MAG-OX) 400 (241.3 Mg) MG tablet Take 1 tablet (400 mg total) by mouth 2 (two) times daily. 60 tablet 0  . Multiple Vitamin (MULTIVITAMIN WITH MINERALS) TABS tablet Take 1  tablet by mouth daily.    Marland Kitchen warfarin (COUMADIN) 2.5 MG tablet Take 2.5 mg by mouth daily.     . potassium chloride (K-DUR) 10 MEQ tablet Take 2 tablets (20 mEq total) by mouth as needed. Only take when you take Torsemide (Patient not taking: Reported on 02/28/2018) 90 tablet 3  . torsemide (DEMADEX) 20 MG tablet Take 1 tablet (20 mg total) by mouth as needed. Only take Torsemide if home weight is over 216lbs (Patient not taking: Reported on 02/28/2018) 90 tablet 2   No current facility-administered medications for this encounter.    Allergies  Allergen Reactions  . Penicillins Hives, Itching and Other (See Comments)    Has patient had a PCN reaction causing immediate rash, facial/tongue/throat swelling, SOB or lightheadedness with hypotension:# # Yes # # Has patient had a PCN reaction causing severe rash involving mucus membranes or skin necrosis: No Has patient had a PCN reaction that required hospitalization: No Has patient had a PCN reaction occurring within the last 10 years: No If all of the above answers are "NO", then may proceed with Cephalosporin use.    Review of systems complete and found to be negative unless listed in HPI.    Wt Readings from Last 3 Encounters:  03/14/18 100.4 kg (221 lb 6.4 oz)  03/11/18 97.1 kg (214 lb 1.1 oz)  02/28/18 98.4 kg (217 lb)    Vitals:   03/14/18 1020 03/14/18 1021  BP: (!) 90/0 110/73  Pulse:  71  SpO2:  99%  Weight:  100.4 kg (221 lb 6.4 oz)  Height:  5\' 10"  (1.778 m)    Vital Signs:  Doppler Pressure:90 Automatc BP:  110/73 (84) HR: 71 SPO2: 99 % on RA  Weight: 221.4 lb w/o eqt  Last weight: 217 b w/o eqip   Physical Exam: General:  NAD.  HEENT: normal  Neck: supple. JVP not elevated.  Carotids 2+ bilat; no bruits. No lymphadenopathy or thryomegaly appreciated. Cor: LVAD hum.  Lungs: Clear. Abdomen: soft, nontender, non-distended. No hepatosplenomegaly. No bruits or masses. Good bowel sounds. Driveline site clean.  Anchor in place. Velour exposed about 1.5 inches. Minimal greenish drainage on dressing. Driveline well incorporated Extremities: no cyanosis, clubbing, rash. Warm no edema  Neuro: alert & oriented x 3. No focal deficits. Moves all 4 without problem     ASSESSMENT AND PLAN:  1. Chronic systolic CHF with biventricular failure-> cardiogenic shock: - Echo 08/13/2017 EF 20-25%.s/p Medtronic BiV ICD in place. Cath 12/18 with stable 1v CAD. s/p HM-3 implant 08/16/17.  - Doing very well with VAD support. NYHA I-II.  - Volume status looks good on exam. No need for lasix. - Continue CR 2. 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 - On aspirin and coumadin. Denies bleeding.  - INR goal 2-2.5 INR 2.37 Discussed dosing with PharmD personally.Marland Kitchen -  Ramp ECHO completed 09/09/2017 with speed increased to 5300.  - VAD interrogated personally. Parameters stable.Having frequent PIs but stable and asymptomatic  - LDH 232 - Driveline site with 8.6PYPPJK exposed velour.Well incorporated. Minimal drainage likely fat liquefication .Marland Kitchen  3. CKD III: - Creatinine improved to 1.19 with holding diuretics. 4. [name]/VF:  - ICD shock 12/09/17 with VT in 180s.  - No recent VT - Continue amiodarone.  5. AFL/atrial fibrillation:  - S/p previous ablation. He is currently in NSR -Continue amio for VT.On coumadin as above.  - No change to current plan.   6. CAD s/p PCI of RCA and DTO:IZTIWP cath with stable CAD as above:  - No s/s of ischemia.    - Continue ASA and statin. Continue CR  7. [name]: Recent A1c8.3 on 6/3. - Per PCP.   8. Anemia:  - Hgb stable at 12.1  Total time spent 45 minutes. Over half that time spent discussing above.    Glori Bickers, MD  03/14/18

## 2018-03-14 NOTE — Progress Notes (Signed)
Patient presents for 3 week f/u in [facility] today with wife. Reports no problems with VAD equipment or concerns with drive line.   Pt says he has been feeling "better" every day. Reports increased stamina, good appetite, "probably not drinking as much as I should". He has been participating in [place] and is increasing his activity level each visit. His BP has been reported high at end of exercise per Rehab nurse. Last week BP was doppler 112, auto cuff 130/100 (106) at end of exercise session - reviewed with Dr. [name] and he says BP this high is OK at end of session. No changes in current meds.   Wife reports she thinks DL is "out farther" than usual. Denies any trauma to DL - see below.     Vital Signs:  Doppler Pressure: 90 Automatc BP:  110/73 (84) HR: 71 SPO2: 99 % on RA  Weight: 221.4 lb w/o eqt  Last weight: 217 b w/o eqip   VAD Indication: Destination Therapy- Implanted 08/16/17   LVAD assessment: HM III: VAD Speed: 5300 rpms Flow: 3.6 Power: 3.9 w    PI: 6.2 Alarms: no clinical alarms  Events: > 85 on 03/13/17 Hct: 26  Fixed speed: 5300 Low speed limit: 5000  Primary: Back up battery expiring in 26 months. Secondary: Back up battery expiring in 29 months    I reviewed the LVAD parameters from today and compared the results to the patient's prior recorded data. LVAD interrogation was NEGATIVE for significant power changes, NEGATIVE for clinical alarms and STABLE for PI events/speed drops. No other changes were made and pump is functioning within specified parameters. Pt is performing daily controller and system monitor self tests along with completing weekly and monthly maintenance for LVAD equipment.  LVAD equipment check completed and is in good working order. Back-up equipment present.   Annual Equipment Maintenance on UBC/PM was performed on 09/17/17.   Exit Site Care: VAD dressing and anchor removed and site care performed using sterile  technique. Drive line exit site cleaned with Chlora prep applicators x 2, allowed to dry, and gauze dressing with silver strip  applied. Exit site healing with partial tissue ingrowth.The velour is exposed 3.5 cm at exit site. Pt denies any trauma to site, but has had significant weight loss. No redness, or foul odor noted. Small amount of yellow drainage present.  Pt c/o itching under current anchors - removed and replaced with cath grip anchor x 1. Pt denies fever or chills. Caregiver instructed to continue twice weekly dressing change with silver strip until further notice.    Device: Medtronic 3 Lead Therapies: ON:  VF 200 bpm, FVT 200 - 240; VT 158 - 200 - ATP Therapies: AT/AF monitor on 171 bpm Pacing: DDD 60 Last check: today 03/14/18 per Erlene Quan (Medtronic rep)   Labs:   Hgb 12.1 - No S/S of bleeding. Specifically denies melena/BRBPR or nosebleeds. States at times he has small amount bloody drainage when he blows his nose, but no nosebleeds.   LDH stable at 232 and is within established baseline ranging from 170-250.  Denies tea-colored urine. No power elevations noted on interrogation.    Plan: 1. No change in meds 2. Bonnita Nasuti will call you with INR and coumadin dosing 3. Return to clinic in 6 weeks with Ramp Echo   Zada Girt RN [place] Coordinator  Office: [phone]  24/7 Pager: [phone]

## 2018-03-16 ENCOUNTER — Encounter (HOSPITAL_COMMUNITY): Payer: PPO

## 2018-03-16 ENCOUNTER — Encounter (HOSPITAL_COMMUNITY)
Admission: RE | Admit: 2018-03-16 | Discharge: 2018-03-16 | Disposition: A | Discharge status: Home / Self Care | Payer: PPO | Source: Ambulatory Visit | Attending: Internal Medicine | Admitting: Internal Medicine

## 2018-03-16 DIAGNOSIS — Z95811 Presence of heart assist device: Secondary | ICD-10-CM | POA: Diagnosis not present

## 2018-03-16 DIAGNOSIS — I5022 Chronic systolic (congestive) heart failure: Secondary | ICD-10-CM

## 2018-03-16 LAB — GLUCOSE, CAPILLARY: Glucose-Capillary: 176 mg/dL — ABNORMAL HIGH (ref 70–99)

## 2018-03-17 ENCOUNTER — Encounter: Payer: Self-pay | Admitting: Nurse Practitioner

## 2018-03-18 ENCOUNTER — Encounter (HOSPITAL_COMMUNITY)
Admission: RE | Admit: 2018-03-18 | Discharge: 2018-03-18 | Disposition: A | Discharge status: Home / Self Care | Payer: PPO | Source: Ambulatory Visit | Attending: Internal Medicine | Admitting: Internal Medicine

## 2018-03-18 DIAGNOSIS — I5022 Chronic systolic (congestive) heart failure: Secondary | ICD-10-CM

## 2018-03-18 DIAGNOSIS — Z95811 Presence of heart assist device: Secondary | ICD-10-CM | POA: Diagnosis not present

## 2018-03-21 ENCOUNTER — Encounter (HOSPITAL_COMMUNITY)
Admission: RE | Admit: 2018-03-21 | Discharge: 2018-03-21 | Disposition: A | Discharge status: Home / Self Care | Payer: PPO | Source: Ambulatory Visit | Attending: Internal Medicine | Admitting: Internal Medicine

## 2018-03-21 DIAGNOSIS — I5022 Chronic systolic (congestive) heart failure: Secondary | ICD-10-CM

## 2018-03-21 DIAGNOSIS — Z95811 Presence of heart assist device: Secondary | ICD-10-CM | POA: Diagnosis not present

## 2018-03-23 ENCOUNTER — Encounter (HOSPITAL_COMMUNITY)
Admission: RE | Admit: 2018-03-23 | Discharge: 2018-03-23 | Disposition: A | Discharge status: Home / Self Care | Payer: PPO | Source: Ambulatory Visit | Attending: Internal Medicine | Admitting: Internal Medicine

## 2018-03-23 DIAGNOSIS — I5022 Chronic systolic (congestive) heart failure: Secondary | ICD-10-CM

## 2018-03-23 DIAGNOSIS — Z95811 Presence of heart assist device: Secondary | ICD-10-CM

## 2018-03-24 ENCOUNTER — Ambulatory Visit (HOSPITAL_COMMUNITY): Payer: Self-pay | Admitting: Pharmacist

## 2018-03-24 ENCOUNTER — Other Ambulatory Visit (HOSPITAL_COMMUNITY): Payer: Self-pay | Admitting: Internal Medicine

## 2018-03-24 DIAGNOSIS — Z7901 Long term (current) use of anticoagulants: Secondary | ICD-10-CM

## 2018-03-24 LAB — POCT INR: INR: 2.1 (ref 2–3)

## 2018-03-28 ENCOUNTER — Telehealth (HOSPITAL_COMMUNITY): Payer: Self-pay | Admitting: *Deleted

## 2018-03-28 ENCOUNTER — Other Ambulatory Visit (HOSPITAL_COMMUNITY): Payer: Self-pay | Admitting: Unknown Physician Specialty

## 2018-03-28 ENCOUNTER — Ambulatory Visit (HOSPITAL_COMMUNITY)
Admission: RE | Admit: 2018-03-28 | Discharge: 2018-03-28 | Disposition: A | Discharge status: Home / Self Care | Payer: PPO | Source: Ambulatory Visit | Attending: Cardiology | Admitting: Cardiology

## 2018-03-28 ENCOUNTER — Ambulatory Visit (HOSPITAL_COMMUNITY): Payer: Self-pay | Admitting: Pharmacist

## 2018-03-28 VITALS — BP 108/0 | HR 65 | Ht 70.0 in | Wt 223.0 lb

## 2018-03-28 DIAGNOSIS — I4891 Unspecified atrial fibrillation: Secondary | ICD-10-CM | POA: Insufficient documentation

## 2018-03-28 DIAGNOSIS — Z955 Presence of coronary angioplasty implant and graft: Secondary | ICD-10-CM | POA: Insufficient documentation

## 2018-03-28 DIAGNOSIS — Z7901 Long term (current) use of anticoagulants: Secondary | ICD-10-CM

## 2018-03-28 DIAGNOSIS — I110 Hypertensive heart disease with heart failure: Secondary | ICD-10-CM | POA: Diagnosis not present

## 2018-03-28 DIAGNOSIS — T148XXA Other injury of unspecified body region, initial encounter: Secondary | ICD-10-CM

## 2018-03-28 DIAGNOSIS — E1122 Type 2 diabetes mellitus with diabetic chronic kidney disease: Secondary | ICD-10-CM | POA: Insufficient documentation

## 2018-03-28 DIAGNOSIS — I472 Ventricular tachycardia: Secondary | ICD-10-CM | POA: Insufficient documentation

## 2018-03-28 DIAGNOSIS — Z7982 Long term (current) use of aspirin: Secondary | ICD-10-CM | POA: Insufficient documentation

## 2018-03-28 DIAGNOSIS — I5022 Chronic systolic (congestive) heart failure: Secondary | ICD-10-CM | POA: Insufficient documentation

## 2018-03-28 DIAGNOSIS — I5082 Biventricular heart failure: Secondary | ICD-10-CM | POA: Insufficient documentation

## 2018-03-28 DIAGNOSIS — D649 Anemia, unspecified: Secondary | ICD-10-CM | POA: Insufficient documentation

## 2018-03-28 DIAGNOSIS — Z880 Allergy status to penicillin: Secondary | ICD-10-CM | POA: Insufficient documentation

## 2018-03-28 DIAGNOSIS — I130 Hypertensive heart and chronic kidney disease with heart failure and stage 1 through stage 4 chronic kidney disease, or unspecified chronic kidney disease: Secondary | ICD-10-CM | POA: Diagnosis not present

## 2018-03-28 DIAGNOSIS — Z7189 Other specified counseling: Secondary | ICD-10-CM | POA: Diagnosis not present

## 2018-03-28 DIAGNOSIS — I4892 Unspecified atrial flutter: Secondary | ICD-10-CM | POA: Insufficient documentation

## 2018-03-28 DIAGNOSIS — I2510 Atherosclerotic heart disease of native coronary artery without angina pectoris: Secondary | ICD-10-CM | POA: Diagnosis not present

## 2018-03-28 DIAGNOSIS — Z95811 Presence of heart assist device: Secondary | ICD-10-CM | POA: Diagnosis not present

## 2018-03-28 DIAGNOSIS — L24A9 Irritant contact dermatitis due friction or contact with other specified body fluids: Secondary | ICD-10-CM

## 2018-03-28 DIAGNOSIS — Z95810 Presence of automatic (implantable) cardiac defibrillator: Secondary | ICD-10-CM | POA: Insufficient documentation

## 2018-03-28 DIAGNOSIS — Z794 Long term (current) use of insulin: Secondary | ICD-10-CM | POA: Diagnosis not present

## 2018-03-28 DIAGNOSIS — I255 Ischemic cardiomyopathy: Secondary | ICD-10-CM | POA: Insufficient documentation

## 2018-03-28 DIAGNOSIS — Z79899 Other long term (current) drug therapy: Secondary | ICD-10-CM | POA: Insufficient documentation

## 2018-03-28 DIAGNOSIS — N183 Chronic kidney disease, stage 3 (moderate): Secondary | ICD-10-CM | POA: Insufficient documentation

## 2018-03-28 LAB — CBC
HCT: 39.5 % (ref 39.0–52.0)
HEMOGLOBIN: 11.7 g/dL — AB (ref 13.0–17.0)
MCH: 24.3 pg — ABNORMAL LOW (ref 26.0–34.0)
MCHC: 29.6 g/dL — ABNORMAL LOW (ref 30.0–36.0)
MCV: 82 fL (ref 80.0–100.0)
Platelets: 168 10*3/uL (ref 150–400)
RBC: 4.82 MIL/uL (ref 4.22–5.81)
RDW: 22.6 % — ABNORMAL HIGH (ref 11.5–15.5)
WBC: 4.9 10*3/uL (ref 4.0–10.5)
nRBC: 0 % (ref 0.0–0.2)

## 2018-03-28 LAB — BASIC METABOLIC PANEL
Anion gap: 8 (ref 5–15)
BUN: 15 mg/dL (ref 8–23)
CHLORIDE: 106 mmol/L (ref 98–111)
CO2: 22 mmol/L (ref 22–32)
Calcium: 9.1 mg/dL (ref 8.9–10.3)
Creatinine, Ser: 1.09 mg/dL (ref 0.61–1.24)
GFR calc Af Amer: >60 mL/min (ref >60)
GFR calc non Af Amer: >60 mL/min (ref >60)
Glucose, Bld: 213 mg/dL — ABNORMAL HIGH (ref 70–99)
Potassium: 4.4 mmol/L (ref 3.5–5.1)
Sodium: 136 mmol/L (ref 135–145)

## 2018-03-28 LAB — PROTIME-INR
INR: 2.44
Prothrombin Time: 26.2 seconds — ABNORMAL HIGH (ref 11.4–15.2)

## 2018-03-28 LAB — LACTATE DEHYDROGENASE: LDH: 194 U/L — ABNORMAL HIGH (ref 98–192)

## 2018-03-28 MED ORDER — LOSARTAN POTASSIUM 25 MG PO TABS: 25.0000 mg | ORAL_TABLET | Freq: Every day | ORAL | 6 refills | Status: DC

## 2018-03-28 NOTE — Progress Notes (Signed)
VAD Clinic Note   HF: Dr. [name]   HPI: [name] [name] is a 68 y.o. male with h/o chronic systolic CHF due to ICM, s/p LVAD HM3 08/16/2017, [name], [name], [name], [name], [name], HTN, HL, and CKD III.  Admitted 08/11/17 with recurrent A/C systolic CHF. Milrinone added, but mixed venous saturation remained low. Seen by CT surgery/VAD team and deemed appropriate for HMIII under DT criteria. He underwent HMIII on 6/17. Chest was unable to be closed due to high intrathoracic pressure and RV failure.  He returned to the OR on 6/18 for evacuation of hematoma. Chest was later closed on 6/20. Post operative course complicated by  open chest, RV failure, recurrent VT, respiratory failure, and dysphagia. CCM consulted for vent management with extubation completed on 08/26/17.  As he improved all drips gradually weaned off. Prior to d/c  ramp echo was completed with speed optimized.  Spent 7/11 - 7/27 in CIR. Underwent hydrotherapy for his buttock wound(s). 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   Seen on 12/10/17 for VT at 180s. ATP attempted x 4, and then shocked with return to NSR. Started amio 200 bid. Saw EP in f/u and said to continue amio 200 bid for 3-4 weeks then got to 200 daily.   He presents today for unscheduled visit due to 2 low flow alarms on VAD. Says he has been very active and doing great. Just completed CR,  Denies orthopnea or PND. No fevers, chills or problems with driveline. No bleeding, melena or neuro symptoms. No VAD alarms. Taking all meds as prescribed. This am had 2 asymptomatic low flow alarms on VAD so called to come be evaluated. No palpitations, syncope, presyncope.    VAD Indication: Destination Therapy- Implanted 08/16/17   LVAD assessment: HM III: VAD Speed: 5300 rpms Flow: 3.3 Power: 3.8 w PI: 7.0 Alarms: 2 low flows  this am at 6:44 and 7:17 am (while lying on right side) Events:  11 PI events on 03/27/18 Hct: 39  Fixed speed: 5300 Low speed limit: 5000  Primary: Back up battery expiring in 26 months. Secondary: Back up battery expiring in 29 months    Past Medical History:  Diagnosis Date  . AICD (automatic cardioverter/defibrillator) present 02/05/2014   Upgrade to Medtronic biventricular ICD, serial number  BLD [id number]   . Atrial flutter ([place]) 04/2012   s/p TEE-EPS+RFCA 04/2012  . CAD (coronary artery disease) 0174,9449 X 2    RCA-T, 70% PL (off CFX), 99% Prox LAD/90% Dist LAD, S/P TAXUS stent x 2  . CHF (congestive heart failure) ([place])   . Chronic anticoagulation   . Chronic systolic heart failure ([place])   . CKD (chronic kidney disease)   . Diabetic retinopathy ([place])   . DM type 2 (diabetes mellitus, type 2) (HCC)    insulin dependent  . HTN (hypertension)   . Hypercholesteremia    ablation  . ICD (implantable cardiac defibrillator) in place   . Ischemic cardiomyopathy March 2015   20-25% 2D   . Nephrolithiasis   . Ventricular tachycardia ([place])     Current Outpatient Medications  Medication Sig Dispense Refill  . amiodarone (PACERONE) 200 MG tablet Take one tablet in the AM.  Take 1/2 tablet in the PM 150 tablet 3  . ascorbic acid (VITAMIN C) 500 MG tablet Take 1 tablet (500 mg total) by mouth  2 (two) times daily. 100 tablet 0  . aspirin 81 MG chewable tablet Chew 1 tablet (81 mg total) by mouth daily.    Marland Kitchen gabapentin (NEURONTIN) 100 MG capsule Take 2 in the morning 1 at lunch and 3 at bedtime. 540 capsule 3  . insulin NPH-regular Human (70-30) 100 UNIT/ML injection Inject 30 Units into the skin daily.    . Insulin Pen Needle (COMFORT EZ PEN NEEDLES) 32G X 6 MM MISC 1 application by Does not apply route at bedtime. 100 each 0  . magnesium oxide (MAG-OX) 400 (241.3 Mg) MG tablet Take 1 tablet (400 mg total) by mouth 2 (two) times daily. 60 tablet 0  . Multiple Vitamin  (MULTIVITAMIN WITH MINERALS) TABS tablet Take 1 tablet by mouth daily.    Marland Kitchen warfarin (COUMADIN) 2.5 MG tablet TAKE 1 TABLET BY MOUTH ONCE DAILY AT  6  PM 30 tablet 0  . losartan (COZAAR) 25 MG tablet Take 1 tablet (25 mg total) by mouth daily. 30 tablet 6  . potassium chloride (K-DUR) 10 MEQ tablet Take 2 tablets (20 mEq total) by mouth as needed. Only take when you take Torsemide (Patient not taking: Reported on 02/28/2018) 90 tablet 3  . torsemide (DEMADEX) 20 MG tablet Take 1 tablet (20 mg total) by mouth as needed. Only take Torsemide if home weight is over 216lbs (Patient not taking: Reported on 02/28/2018) 90 tablet 2   No current facility-administered medications for this encounter.    Allergies  Allergen Reactions  . Penicillins Hives, Itching and Other (See Comments)    Has patient had a PCN reaction causing immediate rash, facial/tongue/throat swelling, SOB or lightheadedness with hypotension:# # Yes # # Has patient had a PCN reaction causing severe rash involving mucus membranes or skin necrosis: No Has patient had a PCN reaction that required hospitalization: No Has patient had a PCN reaction occurring within the last 10 years: No If all of the above answers are "NO", then may proceed with Cephalosporin use.    Review of systems complete and found to be negative unless listed in HPI.    Wt Readings from Last 3 Encounters:  03/28/18 101.2 kg (223 lb)  03/14/18 100.4 kg (221 lb 6.4 oz)  03/11/18 97.1 kg (214 lb 1.1 oz)    Vitals:   03/28/18 0926  BP: (!) 108/0  Pulse: 65  SpO2: 98%  Weight: 101.2 kg (223 lb)  Height: 5\' 10"  (1.778 m)   Vital Signs: Temp: 97.8 oral  Doppler Pressure:108 Automatc BP:  106/81 (92) HR: 60 SPO2: 98% on RA  Weight: 223.0 lb w/o eqt  Last weight: 221.4 lb w/o eqip  Orthostatic V/S:         HR:      BP:                  Flow:   PI:        Power: Lying:                           60        116/91 (99)      3.3       7.2       3.9 Right  side:                   60        137/103 (116)  3.6       5.1  3.8 Standing:                     64        109/71 (91)      3.8       4.1       3.8 Sitting:                         60        103/83 (91)      3.2       7.1       3.9 Right side:                   64        136/89 (106)    2.6       8.0       3.9    Physical Exam: General:  NAD.  HEENT: normal  Neck: supple. JVP 6-7  Carotids 2+ bilat; no bruits. No lymphadenopathy or thryomegaly appreciated. Cor: LVAD hum.  Lungs: Clear. Abdomen: soft, nontender, non-distended. No hepatosplenomegaly. No bruits or masses. Good bowel sounds. Driveline site clean. Anchor in place.  Extremities: no cyanosis, clubbing, rash. Warm no edema  Neuro: alert & oriented x 3. No focal deficits. Moves all 4 without problem       ASSESSMENT AND PLAN:  1. Chronic systolic CHF with biventricular failure-> cardiogenic shock: - Echo 08/13/2017 EF 20-25%.s/p Medtronic BiV ICD in place. Cath 12/18 with stable 1v CAD. s/p HM-3 implant 08/16/17.  - Continues to feel stronger and stronger. Doing very well with VAD support. NYHA I-II.  - Volume status looks good on exam and ICD interrogaton. No need for lasix. - Continue CR 2. 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 - he presents today for 2 asymptomatic low flow alarms on VAD.  - No evidence of bleeding or volume depletion. Rhythm stable on tele and by device interrogation, No recurrent AF or VT. Suspect low flow alarm and PI events may be related to worsening HTN. However there also seems to be a positional component to it when he lies on his R side.  - Will start losartan 25 daily and follow closely. May need repeat echo to look at right side - On aspirin and coumadin. Denies bleeding.  - INR goal 2-2.5 INR 2.44  Discussed dosing with PharmD personally. - Ramp ECHO completed 09/09/2017 with speed increased to 5300.  - VAD interrogated personally. Discussion as above - LDH 194 - Driveline site with 1.5  inches exposed velour.He seems to be very restless sleeper. Stressed need for double anchor at night and to wear snug garment,  3. CKD III: - Creatinine improved to 1.09 with holding diuretics. 4. [name]/VF:  - ICD shock 12/09/17 with VT in 180s.  - ICD interrogated personally todayNo recent VT - Continue amiodarone.  5. AFL/atrial fibrillation:  - S/p previous ablation. He is currently in NSR -Continue amio for VT.On coumadin as above.  - No change to current plan.   6. CAD s/p PCI of RCA and YHC:WCBJSE cath with stable CAD as above:  - No s/s of ischemia.    - Continue ASA and statin. He has completed CR 7. [name]: . - Per PCP.   8. Anemia:  - Hgb stable at 11.7 9. HTN - As above. Adding losartan. Titrate as needed with goal MAP 70-90  Total time spent 35 minutes. Over half that time spent  discussing above.   Glori Bickers, MD  03/28/18

## 2018-03-28 NOTE — Addendum Note (Signed)
Encounter addended by: Jolaine Artist, MD on: 03/28/2018 11:02 PM  Actions taken: Clinical Note Signed, LOS modified, Charge Capture section accepted

## 2018-03-28 NOTE — Patient Instructions (Signed)
1. Start Losartan 25 mg daily. Call VAD pager if any further Low Flow alarms.  2. No change in coumdain, will re-check next Monday. 3. Return to [facility] in one week.

## 2018-03-28 NOTE — Progress Notes (Addendum)
Patient presents for sick visit in [facility] today with wife. Reports low flow alarms this am before getting up. He says he has felst "fine" and denied sxs. He reports chronic "tingling and pain" around drive line site; says it is "about the same" as it's always been.  Wife reports she is changing dressing twice weekly and reports no changes.  Pt denies any lightheadedness or dizziness; did feel "weak" for a minute while in elevator this am coming to clinic. He denies any bloody or black stools Orthostatic V/S performed. Full Medtronic interrogation per Tomi Bamberger (Medtronic rep).   Vital Signs: Temp: 97.8 oral  Doppler Pressure: 108 Automatc BP:  106/81 (92) HR: 60 SPO2: 98% on RA  Weight: 223.0 lb w/o eqt  Last weight: 221.4 lb w/o eqip  Orthostatic V/S: HR: BP:  Flow: PI: Power: Lying:   60 116/91 (99) 3.3 7.2 3.9 Right side:  60 137/103 (116) 3.6 5.1 3.8 Standing:  64 109/71 (91) 3.8 4.1 3.8 Sitting:   60 103/83 (91) 3.2 7.1 3.9 Right side:  64 136/89 (106) 2.6 8.0 3.9  VAD Indication: Destination Therapy- Implanted 08/16/17   LVAD assessment: HM III: VAD Speed: 5300 rpms Flow: 3.3 Power: 3.8 w    PI: 7.0 Alarms: 2 low flows this am at 6:44 and 7:17 am (while lying on right side) Events:  11 PI events on 03/27/18 Hct: 39  Fixed speed: 5300 Low speed limit: 5000  Primary: Back up battery expiring in 26 months. Secondary: Back up battery expiring in 29 months    I reviewed the LVAD parameters from today and compared the results to the patient's prior recorded data. LVAD interrogation was NEGATIVE for significant power changes, POSITIVE for clinical alarms and STABLE for PI events/speed drops. No other changes were made and pump is functioning within specified parameters. Pt is performing daily controller and system monitor self tests along with completing weekly and monthly maintenance for LVAD equipment.  LVAD equipment check completed and is in good working order.  Back-up equipment present.   Annual Equipment Maintenance on UBC/PM was performed on 09/17/17.   Exit Site Care: VAD dressing and anchor removed and site care performed using sterile technique. Drive line exit site cleaned with Chlora prep applicators x 2, allowed to dry, and gauze dressing with silver strip  applied. Exit site healing with partial tissue ingrowth.The velour is exposed 4.5 cm (up from 3.5 cm last visit) at exit site. Pt denies any trauma to site, but does report restless sleeping with "fighting and hollering" during sleep.. No redness, or foul odor noted. Small amount of greenish drainage present; WOUND CULTURE OBTAINED; no tunneling of site. Ptt denies fever or chills. Caregiver instructed to continue twice weekly dressing change with silver strip until further notice.   Device: Medtronic 3 Lead Therapies: ON:  VF 200 bpm, FVT 200 - 240; VT 158 - 200 - ATP Therapies: AT/AF monitor on 171 bpm Pacing: DDD 60 Last check: today 03/28/18 per Tomi Bamberger (Medtronic rep)   Labs:  Hgb 11.7 - No S/S of bleeding. Specifically denies melena/BRBPR or nosebleeds. States at times he has small amount bloody drainage when he blows his nose, but no nosebleeds.   LDH stable at 194 and is within established baseline ranging from 170-250.  Denies tea-colored urine. No power elevations noted on interrogation.   Plan: 1. Start Losartan 25 mg daily. Call VAD pager if any further Low Flow alarms.  2. No change in coumdain, will re-check next Monday.  3. Return to [facility] in one week.  Zada Girt RN [place] Coordinator  Office: [phone]  24/7 Pager: [phone]

## 2018-03-28 NOTE — Telephone Encounter (Signed)
Pt called VAD pager this am to report "Low Flow" alarms. Says they woke him up during the night and are continuing intermittently. Pt says he "feels fine" but hasn't gotten out of bed yet this am.  VAD parameters: Flow 2.5, speed 5300, PI 8.6 ("the highest I have seen" per pt). Informed pt that #'s indicate he may be dry or could indicate rhythm change. Pt denies any N&V, diarrhea, heart palpitations. No recent dizziness or lightheadedness or syncopal episodes. Instructed pt to drink fluids this am, get out of bed slowly and carefully. Asked him to hold meds and bring to clinic this am per Dr. [name]. Pt verbalized understanding of same. Clinic appt added for this am with labs.  Zada Girt RN, [place] Coordinator  [phone]

## 2018-03-31 ENCOUNTER — Ambulatory Visit (INDEPENDENT_AMBULATORY_CARE_PROVIDER_SITE_OTHER): Payer: PPO

## 2018-03-31 DIAGNOSIS — I5022 Chronic systolic (congestive) heart failure: Secondary | ICD-10-CM | POA: Diagnosis not present

## 2018-03-31 DIAGNOSIS — Z95810 Presence of automatic (implantable) cardiac defibrillator: Secondary | ICD-10-CM | POA: Diagnosis not present

## 2018-03-31 LAB — AEROBIC CULTURE W GRAM STAIN (SUPERFICIAL SPECIMEN): Culture: NO GROWTH

## 2018-03-31 NOTE — Progress Notes (Signed)
EPIC Encounter for ICM Monitoring  Patient Name: [name] [name] is a 68 y.o. male Date: 03/31/2018 Primary Care Physican: [name], NP Primary Cardiologist:[name] Electrophysiologist: [name] Last Weight:209lbs  Today's Weight: 212 lbs       Heart Failure questions reviewed.   Pt has gained 3 lbs.  He says he thinks he is drinking more fluids but <2 liters a day.   Report: Thoracic impedance abnormal suggesting fluid accumulation starting 03/22/2018.   Prescribed: Torsemide 20 mg Take 1 tablet (20 mg total) by mouth as needed. Only take Torsemide if home weight is over 216 lbs  Labs: 03/28/2018 Creatinine 1.09, BUN 15, Potassium 4.4, Sodium 136, eGFR >60 03/14/2018 Creatinine 1.19, BUN 23, Potassium 5.1, Sodium 136, eGFR >60  02/28/2018 Creatinine 1.42, BUN 17, Potassium 4.8, Sodium 137, eGFR 51-59  02/14/2018 Creatinine 1.56, BUN 26, Potassium 5.2, Sodium 133, eGFR 45-52  02/11/2018 Creatinine 1.92, BUN 35, Potassium 4.4, Sodium 136, eGFR 35-41 01/07/2018 Creatinine 1.34, BUN 29, Potassium 4.4, Sodium 136, eGFR 53->60  12/31/2017 Creatinine 1.30, BUN 27, Potassium 4.4, Sodium 136, eGFR 55->60  12/10/2017 Creatinine 1.14, BUN 35, Potassium 3.7, Sodium 135, eGFR >60  12/03/2017 Creatinine 1.19, BUN 26, Potassium 3.8, Sodium 137, eGFR >60  11/19/2017 Creatinine 1.13, BUN 30, Potassium 4.0, Sodium 139, eGFR >60  10/26/2017 Creatinine 1.36, BUN 23, Potassium 3.6, Sodium 138, EGFR >60 10/12/2017 Creatinine 1.09, BUN 19, Potassium 4.0, Sodium 136, EGFR >60 10/07/2017 Creatinine 1.11, BUN 20, Potassium 4.8, Sodium 133, EGFR >60 10/04/2017 Creatinine 1.38, BUN 25, Potassium 5.5, Sodium 133, EGFR 51-60 09/23/2017 Creatinine 1.02, BUN 15, Potassium 3.9, Sodium 133, EGFR >60 A complete set of results can be found in results review  Recommendations:  Advised if weight continues to increase to take PRN Torsemide.  Reinforced limiting salt intake to < 2000 mg daily  and fluid intake to 64 oz daily.  Encouraged to call for fluid symptoms.  Follow-up plan: ICM clinic phone appointment on 04/05/2018 (manual send) to recheck fluid levels.   Office appointment scheduled 04/04/2018 with [facility].    Copy of ICM check sent to Dr. [name] and Dr [name] for review and if any recommendations will call back.   3 month ICM trend: 03/31/2018    1 Year ICM trend:       [name], RN 03/31/2018 9:37 AM

## 2018-03-31 NOTE — Progress Notes (Signed)
Follow-up Visit   Date: 04/01/18    [name] [name] MRN: [id number] DOB: [birth year]   Interim History: [name] [name] is a 68 y.o. right-handed African American male with insulin-dependent diabetes mellitus, atrial flutter, CAD, CHF s/p ICD, s/p LVAD (07/2017) hypertension, hyperlipidemia, and CKD returning to the clinic for follow-up of diabetic neuropathy.  The patient was accompanied to the clinic by wife.  History of present illness: Starting around October 2015 he was admitted with CHF exacerbation and developed severe swelling of the legs. Around the same time, he began noticing tingling pain involving the feet and lower calf which has gradually been worsening over the past year. It is worse after he has been walking all day and seem less painful in the morning. He cannot stand anyone touching his feet, because it irritates it.There is no numbness or weakness.  He endorses difficulty with balance, but has not fallen and walks independently. He has been diabetes for 15+ years and insulin-dependent for most of this time. He was started on gabapentin and has some mild transient improvement, but despite increasing the dose to 600mg  TID, there was no benefit.  He tried Lyrica 75mg  twice daily, but did not appreciate any benefit so switched back to gabapentin 600mg  twice daily.  Because of his cardiac comorbidities and prolonged QTc, avoid TCAs. He has LVAD placed in June 2019 as destination therapy.  UPDATE 03/31/2018:  He is here for follow-up visit.  Neuropathic pain remains stable and he is coping with it better. He is taking gabapentin 200mg  in the morning and 300mg  at bedtime and is tolerating it well.  His renal function has improved into normal ranges.  He denies new leg weakness or falls.  He is still trying to get used to having an LVAD.  Today, he mentions new symptoms of exertional thigh and leg pain and numbness, which is worse on the right.  This occurs after walking  about 6-7 minutes and resolves when he rests.     Medications:  Current Outpatient Medications on File Prior to Visit  Medication Sig Dispense Refill  . amiodarone (PACERONE) 200 MG tablet Take one tablet in the AM.  Take 1/2 tablet in the PM 150 tablet 3  . ascorbic acid (VITAMIN C) 500 MG tablet Take 1 tablet (500 mg total) by mouth 2 (two) times daily. 100 tablet 0  . aspirin 81 MG chewable tablet Chew 1 tablet (81 mg total) by mouth daily.    . insulin NPH-regular Human (70-30) 100 UNIT/ML injection Inject 30 Units into the skin daily.    . Insulin Pen Needle (COMFORT EZ PEN NEEDLES) 32G X 6 MM MISC 1 application by Does not apply route at bedtime. 100 each 0  . losartan (COZAAR) 25 MG tablet Take 1 tablet (25 mg total) by mouth daily. 30 tablet 6  . magnesium oxide (MAG-OX) 400 (241.3 Mg) MG tablet Take 1 tablet (400 mg total) by mouth 2 (two) times daily. 60 tablet 0  . Multiple Vitamin (MULTIVITAMIN WITH MINERALS) TABS tablet Take 1 tablet by mouth daily.    . potassium chloride (K-DUR) 10 MEQ tablet Take 2 tablets (20 mEq total) by mouth as needed. Only take when you take Torsemide 90 tablet 3  . torsemide (DEMADEX) 20 MG tablet Take 1 tablet (20 mg total) by mouth as needed. Only take Torsemide if home weight is over 216lbs 90 tablet 2  . warfarin (COUMADIN) 2.5 MG tablet TAKE 1 TABLET BY MOUTH ONCE  DAILY AT  6  PM 30 tablet 0   No current facility-administered medications on file prior to visit.     Allergies:  Allergies  Allergen Reactions  . Penicillins Hives, Itching and Other (See Comments)    Has patient had a PCN reaction causing immediate rash, facial/tongue/throat swelling, SOB or lightheadedness with hypotension:# # Yes # # Has patient had a PCN reaction causing severe rash involving mucus membranes or skin necrosis: No Has patient had a PCN reaction that required hospitalization: No Has patient had a PCN reaction occurring within the last 10 years: No If all of the  above answers are "NO", then may proceed with Cephalosporin use.     Review of Systems:  CONSTITUTIONAL: No fevers, chills, night sweats, or weight loss.  EYES: No visual changes or eye pain ENT: No hearing changes.  No history of nose bleeds.   RESPIRATORY: No cough, wheezing and shortness of breath.   CARDIOVASCULAR: Negative for chest pain, and palpitations.   GI: Negative for abdominal discomfort, blood in stools or black stools.  No recent change in bowel habits.   GU:  No history of incontinence.   MUSCLOSKELETAL: No history of joint pain or swelling.  No myalgias.   SKIN: Negative for lesions, rash, and itching.   ENDOCRINE: Negative for cold or heat intolerance, polydipsia or goiter.   PSYCH:  No depression or anxiety symptoms.   NEURO: As Above.   Vital Signs:  Resp 16   Ht 5\' 11"  (1.803 m)   Wt 218 lb 8 oz (99.1 kg)   SpO2 98%   BMI 30.47 kg/m   General Medical Exam:   General:  Well appearing, comfortable  Eyes/ENT: see cranial nerve examination.   Neck: No masses appreciated.  Full range of motion without tenderness.  No carotid bruits. Respiratory:  Clear to auscultation, good air entry bilaterally.   Cardiac:  Continuous hum from LVAD Ext:  Mild edema in the legs, legs are warm     Neurological Exam: MENTAL STATUS including orientation to time, place, person, recent and remote memory, attention span and concentration, language, and fund of knowledge is normal.  Speech is not dysarthric.  CRANIAL NERVES:  Face is symmetric.  MOTOR: Motor strength is 5/5 throughout  SENSORY: Vibration is reduced at the ankles, intact at the knees  COORDINATION/GAIT:  Gait stable, unassisted.    Data: Labs 05/29/2015:  Copper 134, TSH 4.29, HbA1c 11.6*, vitamin B12 751 Lab Results  Component Value Date   HGBA1C 8.9 (A) 02/10/2018   Lab Results  Component Value Date   CREATININE 1.09 03/28/2018   BUN 15 03/28/2018   NA 136 03/28/2018   K 4.4 03/28/2018   CL 106  03/28/2018   CO2 22 03/28/2018    IMPRESSION/PLAN: 1.  Distal and symmetric diabetic neuropathy affecting the feet.   - Previously tried:  Lyrica (ineffective), Cymbalta (REM behavior disorder, sleep walking)  - With his renal function improved, I will increase gabapentin to 300mg  BID.  He can take extra 100mg  in the afternoon as needed  - Avoid TCA due to prolonged QTc  2.  Bilateral leg claudication, worse on the right He also complains of exertional thigh and lower leg numbness/pain which is more concerned for vascular claudication. He had arterial studies of the leg in May 2018 which showed 30-49% right distal SFA stenosis.  I recommend repeat arterial studies of the legs, and he would like to discuss this with his cardiologist on Monday.  Return to clinic in 4 months   Thank you for allowing me to participate in patient's care.  If I can answer any additional questions, I would be pleased to do so.    Sincerely,    Donika K. Posey Pronto, DO

## 2018-04-01 ENCOUNTER — Encounter: Payer: Self-pay | Admitting: Neurology

## 2018-04-01 ENCOUNTER — Ambulatory Visit: Payer: PPO | Admitting: Neurology

## 2018-04-01 ENCOUNTER — Other Ambulatory Visit (HOSPITAL_COMMUNITY): Payer: Self-pay | Admitting: Unknown Physician Specialty

## 2018-04-01 VITALS — Resp 16 | Ht 71.0 in | Wt 218.5 lb

## 2018-04-01 DIAGNOSIS — Z95811 Presence of heart assist device: Secondary | ICD-10-CM

## 2018-04-01 DIAGNOSIS — E0842 Diabetes mellitus due to underlying condition with diabetic polyneuropathy: Secondary | ICD-10-CM

## 2018-04-01 DIAGNOSIS — Z7901 Long term (current) use of anticoagulants: Secondary | ICD-10-CM

## 2018-04-01 DIAGNOSIS — I739 Peripheral vascular disease, unspecified: Secondary | ICD-10-CM

## 2018-04-01 DIAGNOSIS — M792 Neuralgia and neuritis, unspecified: Secondary | ICD-10-CM | POA: Diagnosis not present

## 2018-04-01 MED ORDER — GABAPENTIN 300 MG PO CAPS: 300.0000 mg | ORAL_CAPSULE | Freq: Two times a day (BID) | ORAL | 3 refills | Status: DC

## 2018-04-01 NOTE — Patient Instructions (Addendum)
Increase gabapentin to 300mg  twice daily.  You can take an extra 100mg  in the afternoon as needed for pain.  Talk to your cardiologist about checking blood flow into your legs, as this may be causing your leg pain when you are walking.   Return to clinic in 4 months

## 2018-04-04 ENCOUNTER — Telehealth (HOSPITAL_COMMUNITY): Payer: Self-pay | Admitting: *Deleted

## 2018-04-04 ENCOUNTER — Encounter (HOSPITAL_COMMUNITY): Payer: Self-pay

## 2018-04-04 ENCOUNTER — Ambulatory Visit (HOSPITAL_COMMUNITY): Payer: Self-pay | Admitting: *Deleted

## 2018-04-04 ENCOUNTER — Ambulatory Visit (HOSPITAL_COMMUNITY)
Admission: RE | Admit: 2018-04-04 | Discharge: 2018-04-04 | Disposition: A | Discharge status: Home / Self Care | Payer: PPO | Source: Ambulatory Visit | Attending: Internal Medicine | Admitting: Internal Medicine

## 2018-04-04 DIAGNOSIS — Z7901 Long term (current) use of anticoagulants: Secondary | ICD-10-CM | POA: Diagnosis not present

## 2018-04-04 DIAGNOSIS — Z95811 Presence of heart assist device: Secondary | ICD-10-CM | POA: Insufficient documentation

## 2018-04-04 LAB — CBC
HCT: 41.4 % (ref 39.0–52.0)
Hemoglobin: 12 g/dL — ABNORMAL LOW (ref 13.0–17.0)
MCH: 23.7 pg — ABNORMAL LOW (ref 26.0–34.0)
MCHC: 29 g/dL — AB (ref 30.0–36.0)
MCV: 81.8 fL (ref 80.0–100.0)
Platelets: 190 10*3/uL (ref 150–400)
RBC: 5.06 MIL/uL (ref 4.22–5.81)
RDW: 21.7 % — ABNORMAL HIGH (ref 11.5–15.5)
WBC: 4.2 10*3/uL (ref 4.0–10.5)
nRBC: 0 % (ref 0.0–0.2)

## 2018-04-04 LAB — BASIC METABOLIC PANEL
Anion gap: 8 (ref 5–15)
BUN: 24 mg/dL — ABNORMAL HIGH (ref 8–23)
CHLORIDE: 105 mmol/L (ref 98–111)
CO2: 24 mmol/L (ref 22–32)
Calcium: 9 mg/dL (ref 8.9–10.3)
Creatinine, Ser: 1.34 mg/dL — ABNORMAL HIGH (ref 0.61–1.24)
GFR calc Af Amer: >60 mL/min (ref >60)
GFR calc non Af Amer: 54 mL/min — ABNORMAL LOW (ref >60)
Glucose, Bld: 331 mg/dL — ABNORMAL HIGH (ref 70–99)
POTASSIUM: 5.2 mmol/L — AB (ref 3.5–5.1)
Sodium: 137 mmol/L (ref 135–145)

## 2018-04-04 LAB — LACTATE DEHYDROGENASE: LDH: 192 U/L (ref 98–192)

## 2018-04-04 LAB — PROTIME-INR
INR: 2.33
Prothrombin Time: 25.2 seconds — ABNORMAL HIGH (ref 11.4–15.2)

## 2018-04-04 NOTE — Progress Notes (Addendum)
Patient presents for one week f/u in [facility] today with wife. Denies any issues with VAD equipment or any further VAD alarms.   He reports same pain and "tingling sensation" around DL exit site. He is worried about it being "pulled out". Denies any known trauma, but wife says he "sleeps rough" with lots of movement and "fighting". Culture obtained last week was negative. Site care performed today - see below. Pt denies any fevers or chills.   Pt confirms he did start Losartan 25 mg daily as instructed last visit; but reports he did not take any am meds except Sildenafil 20 mg this am. Asked pt to take all am meds prior to any future clinic visits so we can get accurate BP. Pt verbalized understanding of same.    Vital Signs: Doppler Pressure: 94 Automatc BP: 108/87 (96) HR: 70 SPO2: 97% on RA  Weight: 224.8 lb w/o eqt  Last weight: 223.0 lb w/o eqip  Right side:  HR: BP:  Flow: PI: Power:      70 127/90 (97) 3.1 7.5 3.8  VAD Indication: Destination Therapy- Implanted 08/16/17   LVAD assessment: HM III: VAD Speed: 5300 rpms Flow: 3.6 Power: 3.9 w    PI: 5.6 Alarms:  none Events:  52 PI events yesterday; > 65 on 04/02/18 Hct: 41  Fixed speed: 5300 Low speed limit: 5000  Primary: Back up battery expiring in 25 months. Secondary: Back up battery expiring in 28 months    I reviewed the LVAD parameters from today and compared the results to the patient's prior recorded data. LVAD interrogation was NEGATIVE for significant power changes, NEGATIVE for clinical alarms and STABLE for PI events/speed drops. No other changes were made and pump is functioning within specified parameters. Pt is performing daily controller and system monitor self tests along with completing weekly and monthly maintenance for LVAD equipment.  LVAD equipment check completed and is in good working order. Back-up equipment present.   Annual Equipment Maintenance on UBC/PM was performed on 09/17/17.    Exit Site Care: VAD dressing removed and site care performed using sterile technique. Drive line exit site cleaned with Chlora prep applicators x 2, allowed to dry, and gauze dressing with silver strip  applied. Exit site healing with partial tissue ingrowth.The velour is exposed 4 cm at exit site. Pt denies any trauma to site, but does report restless sleeping with "fighting and hollering" during sleep. Small amount yellow drainage noted with no redness or foul odor noted; pt denies fevers or chills. DL wound culture from last week negative.  Caregiver instructed to continue twice weekly dressing change with silver strip until further notice. Pt provided with extra silver strips and anchors.        Device: Medtronic 3 Lead Therapies: ON:  VF 200 bpm, FVT 200 - 240; VT 158 - 200 - ATP Therapies: AT/AF monitor on 171 bpm Pacing: DDD 60 Last check: today 03/28/18 per Tomi Bamberger ([employer] rep)   Attempted Optivol interrogation today - no report received.   Labs:  Hgb 12.0 - No S/S of bleeding. Specifically denies melena/BRBPR or nosebleeds.  LDH stable at 192 and is within established baseline ranging from 170-250.  Denies tea-colored urine. No power elevations noted on interrogation.   Plan: 1. Continue Losartan 25 mg daily; call VAD pager if any Low Flow alarms. 2. Will call you when Optivol reviewed by Dr. [name] re: diuretic dosing. 3. No change in coumadin dose; re-check next Thursday at home.  4.  Return to [facility] on 04/22/18 for VAD clinic visit with ramp echo.   Zada Girt RN [place] Coordinator  Office: [phone]  24/7 Pager: [phone]

## 2018-04-04 NOTE — Telephone Encounter (Signed)
Called pt per Dr. [name]. Optivol interrogation unsuccessful in VAD clinic today. Asked pt to send home transmission to device clinic - he verbalized agreement to same.  Spoke with Garlon Hatchet at device clinic (message request sent as well) requesting home transmission to evaluate fluid status per Dr. Clayborne Dana request. She verbalized agreement to same.  Zada Girt RN, [place] Coordinator [phone]

## 2018-04-05 NOTE — Telephone Encounter (Signed)
LMOVM for pt to return call. Remote transmission has not been received.

## 2018-04-05 NOTE — Telephone Encounter (Signed)
Spoke w/ pt wife and she said she would have the transmission sent today.

## 2018-04-08 NOTE — Progress Notes (Signed)
Discharge Progress Report  Patient Details  Name: [name] [name] MRN: [id number] Date of Birth: May 24, 1950 Referring Provider:     CARDIAC REHAB PHASE II ORIENTATION from 12/09/2017 in [place]  Referring Provider  Glori Bickers MD       Number of Visits: 36  Reason for Discharge:  Patient reached a stable level of exercise. Patient independent in their exercise. Patient has met program and personal goals.  Smoking History:  Social History   Tobacco Use  Smoking Status Former Smoker  . Packs/day: 1.00  . Years: 29.00  . Pack years: 29.00  . Types: Cigarettes  . Last attempt to quit: 03/02/2000  . Years since quitting: 18.1  Smokeless Tobacco Never Used    Diagnosis:  Left ventricular assist device present ([place])  Heart failure, chronic systolic (HCC)  ADL UCSD:   Initial Exercise Prescription: Initial Exercise Prescription - 12/09/17 1100      Date of Initial Exercise RX and Referring Provider   Date  12/09/17    Referring Provider  Glori Bickers MD    Expected Discharge Date  03/18/18      NuStep   Level  1    SPM  75    Minutes  10    METs  1.5      Arm Ergometer   Level  1    Watts  10    Minutes  10    METs  1.5      Track   Laps  5    Minutes  10    METs  1.84      Prescription Details   Frequency (times per week)  3    Duration  Progress to 30 minutes of continuous aerobic without signs/symptoms of physical distress      Intensity   THRR 40-80% of Max Heartrate  61-122    Ratings of Perceived Exertion  11-13    Perceived Dyspnea  0-4      Progression   Progression  Continue progressive overload as per policy without signs/symptoms or physical distress.      Resistance Training   Training Prescription  Yes    Weight  2    Reps  10-15       Discharge Exercise Prescription (Final Exercise Prescription Changes): Exercise Prescription Changes - 03/23/18 1543      Response to Exercise   Blood Pressure (Admit)  --   MAP: 92   Blood Pressure (Exercise)  --   MAP: 92   Blood Pressure (Exit)  --   MAP: 82   Heart Rate (Admit)  62 bpm    Heart Rate (Exercise)  70 bpm    Heart Rate (Exit)  61 bpm    Rating of Perceived Exertion (Exercise)  12    Perceived Dyspnea (Exercise)  0    Comments  Pt graduated from Cardiac Rehab    Duration  Progress to 30 minutes of  aerobic without signs/symptoms of physical distress    Intensity  THRR unchanged      Progression   Progression  Continue to progress workloads to maintain intensity without signs/symptoms of physical distress.    Average METs  2.5      Resistance Training   Training Prescription  Yes    Weight  4lbs    Reps  10-15    Time  10 Minutes      Interval Training   Interval Training  No  NuStep   Level  3    SPM  95    Minutes  15    METs  2.2      Arm Ergometer   Level  1.5    Watts  12    Minutes  15    METs  2      Track   Laps  9    Minutes  10    METs  2.57      Home Exercise Plan   Plans to continue exercise at  Home (comment)   Walking   Frequency  Add 3 additional days to program exercise sessions.    Initial Home Exercises Provided  01/14/18       Functional Capacity: 6 Minute Walk    Row Name 12/09/17 1007 12/09/17 1127 03/11/18 1451     6 Minute Walk   Phase  Initial  Initial  Discharge   Distance  -  685 feet  1235 feet   Distance % Change  -  -  80.29 %   Distance Feet Change  -  -  550 ft   Walk Time  -  4.15 minutes  5.83 minutes   # of Rest Breaks  -  1  1   MPH  -  1.29  2.34   METS  -  1.34  2.34   RPE  -  12  13   Perceived Dyspnea   -  0  0   VO2 Peak  -  4.7  8.2   Symptoms  -  Yes (comment)  No   Comments  -  Leg Fatigue   Leg Fatigue 7/10   Resting HR  -  89 bpm  69 bpm   Resting BP  -  - MAP: 84  - MAP: 84   Resting Oxygen Saturation   -  96 %  -   Max Ex. HR  -  103 bpm  82 bpm   Max Ex. BP  -  - MAP: 88  - MAP: 84   2 Minute Post BP  -  - MAP: 78  -  MAP: 90      Psychological, QOL, Others - Outcomes: PHQ 2/9: Depression screen [facility] 2/9 03/21/2018 12/15/2017 10/08/2017 09/29/2017  Decreased Interest 0 0 0 0  Down, Depressed, Hopeless 0 0 0 0  PHQ - 2 Score 0 0 0 0  Some recent data might be hidden    Quality of Life: Quality of Life - 03/14/18 1636      Quality of Life Scores   Health/Function Pre  17.71 %    Health/Function Post  21.6 %    Health/Function % Change  21.96 %    Socioeconomic Pre  20.1 %    Socioeconomic Post  20.36 %    Socioeconomic % Change   1.29 %    Psych/Spiritual Pre  22 %    Psych/Spiritual Post  23.36 %    Psych/Spiritual % Change  6.18 %    Family Pre  22.8 %    Family Post  24.6 %    Family % Change  7.89 %    GLOBAL Pre  19.89 %    GLOBAL Post  22.15 %    GLOBAL % Change  11.36 %       Personal Goals: Goals established at orientation with interventions provided to work toward goal. Personal Goals and Risk Factors at Admission - 12/09/17 1144  Core Components/Risk Factors/Patient Goals on Admission    Weight Management  Yes;Weight Loss    Intervention  Weight Management: Develop a combined nutrition and exercise program designed to reach desired caloric intake, while maintaining appropriate intake of nutrient and fiber, sodium and fats, and appropriate energy expenditure required for the weight goal.;Weight Management: Provide education and appropriate resources to help participant work on and attain dietary goals.;Weight Management/Obesity: Establish reasonable short term and long term weight goals.;Obesity: Provide education and appropriate resources to help participant work on and attain dietary goals.    Admit Weight  235 lb 10.8 oz (106.9 kg)    Goal Weight: Long Term  225 lb (102.1 kg)    Expected Outcomes  Short Term: Continue to assess and modify interventions until short term weight is achieved;Long Term: Adherence to nutrition and physical activity/exercise program aimed toward  attainment of established weight goal;Weight Loss: Understanding of general recommendations for a balanced deficit meal plan, which promotes 1-2 lb weight loss per week and includes a negative energy balance of [phone] kcal/d;Understanding recommendations for meals to include 15-35% energy as protein, 25-35% energy from fat, 35-60% energy from carbohydrates, less than 2109m of dietary cholesterol, 20-35 gm of total fiber daily;Understanding of distribution of calorie intake throughout the day with the consumption of 4-5 meals/snacks    Diabetes  Yes    Intervention  Provide education about signs/symptoms and action to take for hypo/hyperglycemia.;Provide education about proper nutrition, including hydration, and aerobic/resistive exercise prescription along with prescribed medications to achieve blood glucose in normal ranges: Fasting glucose 65-99 mg/dL    Expected Outcomes  Short Term: Participant verbalizes understanding of the signs/symptoms and immediate care of hyper/hypoglycemia, proper foot care and importance of medication, aerobic/resistive exercise and nutrition plan for blood glucose control.;Long Term: Attainment of HbA1C < 7%.    Heart Failure  Yes    Intervention  Provide a combined exercise and nutrition program that is supplemented with education, support and counseling about heart failure. Directed toward relieving symptoms such as shortness of breath, decreased exercise tolerance, and extremity edema.    Expected Outcomes  Improve functional capacity of life;Short term: Attendance in program 2-3 days a week with increased exercise capacity. Reported lower sodium intake. Reported increased fruit and vegetable intake. Reports medication compliance.;Short term: Daily weights obtained and reported for increase. Utilizing diuretic protocols set by physician.;Long term: Adoption of self-care skills and reduction of barriers for early signs and symptoms recognition and intervention leading to  self-care maintenance.    Hypertension  Yes    Intervention  Provide education on lifestyle modifcations including regular physical activity/exercise, weight management, moderate sodium restriction and increased consumption of fresh fruit, vegetables, and low fat dairy, alcohol moderation, and smoking cessation.;Monitor prescription use compliance.    Expected Outcomes  Short Term: Continued assessment and intervention until BP is < 140/964mHG in hypertensive participants. < 130/8043mG in hypertensive participants with diabetes, heart failure or chronic kidney disease.;Long Term: Maintenance of blood pressure at goal levels.    Lipids  Yes    Intervention  Provide education and support for participant on nutrition & aerobic/resistive exercise along with prescribed medications to achieve LDL <47m18mDL >40mg38m Expected Outcomes  Short Term: Participant states understanding of desired cholesterol values and is compliant with medications prescribed. Participant is following exercise prescription and nutrition guidelines.;Long Term: Cholesterol controlled with medications as prescribed, with individualized exercise RX and with personalized nutrition plan. Value goals: LDL < 47mg,41m > 40  mg.        Personal Goals Discharge: Goals and Risk Factor Review    Row Name 12/15/17 1440 12/30/17 1053 01/20/18 1220 02/10/18 1501 03/09/18 1704     Core Components/Risk Factors/Patient Goals Review   Personal Goals Review  Weight Management/Obesity;Lipids;Heart Failure;Hypertension;Diabetes  Weight Management/Obesity;Lipids;Heart Failure;Hypertension;Diabetes  Weight Management/Obesity;Lipids;Heart Failure;Hypertension;Diabetes  Weight Management/Obesity;Lipids;Heart Failure;Hypertension;Diabetes  Weight Management/Obesity;Lipids;Heart Failure;Hypertension;Diabetes   Review  Pt presents to cardiac rehab willing to participate.  [name] [name] would like increase his strength and stamina.   Pt presents to cardiac rehab  willing to participate.  [name] [name] has started exercise and is tolerating it very well.  He enjoys CR.   Pt with multiple RF willing to participate in CR.  [name] [name] continues to tolerate exercise.  He feels that he is gaining strength and enjoys coming to CR.   Pt with multiple RF willing to participate in CR.  [name] [name] continues to tolerate exercise though his MET levels are not progressing.  He continues to enjoy coming to CR.   Pt with multiple RF willing to participate in CR. 30 Day ITP Review.  [name] [name] has experienced some BP issues during CR.  He was dizzy during one session and required f/u with MD where medication changes were made.  Other days [name] [name] has been tolerating exercise well.  METs are progressing some. [name] [name] enjoys participating in program.    Expected Outcomes  Pt will continue to participate in CR exercise, nutrition, and lifestyle modification opportunities.   Pt will continue to participate in CR exercise, nutrition, and lifestyle modification opportunities.   Pt will continue to participate in CR exercise, nutrition, and lifestyle modification opportunities.   Pt will continue to participate in CR exercise, nutrition, and lifestyle modification opportunities.   Pt will continue to participate in CR exercise, nutrition, and lifestyle modification opportunities.    Row Name 03/24/18 1210             Core Components/Risk Factors/Patient Goals Review   Personal Goals Review  Weight Management/Obesity;Lipids;Heart Failure;Hypertension;Diabetes       Review  [name] [name] has graduated from [facility] with 36 completed sessions.  He did well in the program and felt that he increased his strength. [name]'s METs continued to progress.  He lost a significant amount of weight, 9.8 kg.        Expected Outcomes  Pt will continue to participate in exercise, nutrition, and lifestyle modification opportunities.  [name] plans to walk at home for exercise 3-4 days a week.           Exercise Goals and Review: Exercise Goals    Row  Name 12/09/17 1131             Exercise Goals   Increase Physical Activity  Yes       Intervention  Provide advice, education, support and counseling about physical activity/exercise needs.;Develop an individualized exercise prescription for aerobic and resistive training based on initial evaluation findings, risk stratification, comorbidities and participant's personal goals.       Expected Outcomes  Short Term: Attend rehab on a regular basis to increase amount of physical activity.;Long Term: Add in home exercise to make exercise part of routine and to increase amount of physical activity.;Long Term: Exercising regularly at least 3-5 days a week.       Increase Strength and Stamina  Yes       Intervention  Provide advice, education, support and counseling about physical activity/exercise needs.;Develop an individualized exercise prescription for aerobic and resistive training  based on initial evaluation findings, risk stratification, comorbidities and participant's personal goals.       Expected Outcomes  Short Term: Increase workloads from initial exercise prescription for resistance, speed, and METs.;Short Term: Perform resistance training exercises routinely during rehab and add in resistance training at home;Long Term: Improve cardiorespiratory fitness, muscular endurance and strength as measured by increased METs and functional capacity (6MWT)       Able to understand and use rate of perceived exertion (RPE) scale  Yes       Intervention  Provide education and explanation on how to use RPE scale       Expected Outcomes  Short Term: Able to use RPE daily in rehab to express subjective intensity level;Long Term:  Able to use RPE to guide intensity level when exercising independently       Knowledge and understanding of Target Heart Rate Range (THRR)  Yes       Intervention  Provide education and explanation of THRR including how the numbers were predicted and where they are located for  reference       Expected Outcomes  Short Term: Able to state/look up THRR;Short Term: Able to use daily as guideline for intensity in rehab;Long Term: Able to use THRR to govern intensity when exercising independently       Able to check pulse independently  Yes       Intervention  Provide education and demonstration on how to check pulse in carotid and radial arteries.;Review the importance of being able to check your own pulse for safety during independent exercise       Expected Outcomes  Short Term: Able to explain why pulse checking is important during independent exercise;Long Term: Able to check pulse independently and accurately       Understanding of Exercise Prescription  Yes       Intervention  Provide education, explanation, and written materials on patient's individual exercise prescription       Expected Outcomes  Short Term: Able to explain program exercise prescription;Long Term: Able to explain home exercise prescription to exercise independently          Exercise Goals Re-Evaluation: Exercise Goals Re-Evaluation    Row Name 01/14/18 1355 02/10/18 1338 03/10/18 1414 03/24/18 1551       Exercise Goal Re-Evaluation   Exercise Goals Review  Increase Physical Activity;Able to understand and use rate of perceived exertion (RPE) scale;Knowledge and understanding of Target Heart Rate Range (THRR);Understanding of Exercise Prescription;Increase Strength and Stamina;Able to check pulse independently  Increase Physical Activity;Understanding of Exercise Prescription  Increase Physical Activity;Understanding of Exercise Prescription  Understanding of Exercise Prescription;Increase Physical Activity  (Pended)     Comments  Reviewed HEP with pt. Also reviewed THRR, RPE Scale, weather conditions, endpoints of exercise, NTG use, when to call Dr/911, warmup and cool down.   Reviewed METs and Goals. Pt is putting forth effort with exercise. Unable to increase pt's levels due to pt tolerance to  increases. Will continue to work with pt to slowly progress pt.  Pt is continuing to respond well to exercise prescription. Pt states he is enjoying rehab and does not want it to end. Will continue to work with pt to increase MET level.   Pt   (Pended)     Expected Outcomes  Pt will continue to walk 5-7 days a week for 20 minutes. Pt will continue to increase cardiovascular strength. Pt states he is very grateful for rehab. Will continue to monitor.  Pt will continue to walk for exercise. Pt will continue to improve stamina. Will progress pt as tolerated.   Pt is continuing to walk for exercise 2 days a week. Pt will continue to increase cardiovascular strength. Will continue to monitor.   Pt will continue to walk at home 5-6 days a week for 30 minutes. Pt states he may also join local church gym to visit 2x a week.   (Pended)        Nutrition & Weight - Outcomes: Pre Biometrics - 03/11/18 1453      Pre Biometrics   Height  '5\' 10"'  (1.778 m)    Weight  97.1 kg    Waist Circumference  42 inches    Hip Circumference  42 inches    Waist to Hip Ratio  1 %    BMI (Calculated)  30.72    Triceps Skinfold  22 mm    % Body Fat  30.9 %    Grip Strength  34 kg    Flexibility  12 in    Single Leg Stand  2 seconds        Nutrition: Nutrition Therapy & Goals - 04/01/18 0746      Nutrition Therapy   Diet  heart healthy      Personal Nutrition Goals   Nutrition Goal  pt to identify and limit food sources of saturated fat, trans fats, sodium, and refined carbohydrates      Intervention Plan   Intervention  Prescribe, educate and counsel regarding individualized specific dietary modifications aiming towards targeted core components such as weight, hypertension, lipid management, diabetes, heart failure and other comorbidities.    Expected Outcomes  Short Term Goal: Understand basic principles of dietary content, such as calories, fat, sodium, cholesterol and nutrients.;Long Term Goal: Adherence to  prescribed nutrition plan.       Nutrition Discharge: Nutrition Assessments - 04/01/18 0749      MEDFICTS Scores   Pre Score  55    Post Score  9    Score Difference  -46       Education Questionnaire Score: Knowledge Questionnaire Score - 03/14/18 1635      Knowledge Questionnaire Score   Pre Score  19/24    Post Score  20/24       Goals reviewed with patient; copy given to patient.

## 2018-04-11 ENCOUNTER — Other Ambulatory Visit: Payer: Self-pay

## 2018-04-11 ENCOUNTER — Ambulatory Visit (HOSPITAL_COMMUNITY): Payer: Self-pay | Admitting: Pharmacist

## 2018-04-11 DIAGNOSIS — Z7901 Long term (current) use of anticoagulants: Secondary | ICD-10-CM

## 2018-04-11 LAB — POCT INR: INR: 2.3 (ref 2–3)

## 2018-04-11 NOTE — Progress Notes (Signed)
No ICM remote transmission received for 04/05/2018 and next ICM transmission scheduled for 05/03/2018.

## 2018-04-14 ENCOUNTER — Ambulatory Visit (HOSPITAL_COMMUNITY): Payer: Self-pay | Admitting: Pharmacist

## 2018-04-14 DIAGNOSIS — Z7901 Long term (current) use of anticoagulants: Secondary | ICD-10-CM

## 2018-04-14 LAB — POCT INR: INR: 2.3 (ref 2–3)

## 2018-04-22 ENCOUNTER — Encounter (HOSPITAL_COMMUNITY): Payer: PPO

## 2018-04-22 ENCOUNTER — Ambulatory Visit (HOSPITAL_COMMUNITY): Payer: Self-pay | Admitting: Pharmacist

## 2018-04-22 ENCOUNTER — Ambulatory Visit (HOSPITAL_COMMUNITY): Payer: PPO

## 2018-04-22 DIAGNOSIS — Z7901 Long term (current) use of anticoagulants: Secondary | ICD-10-CM

## 2018-04-22 LAB — POCT INR: INR: 1.6 — AB (ref 2.0–3.0)

## 2018-04-27 ENCOUNTER — Other Ambulatory Visit (HOSPITAL_COMMUNITY): Payer: Self-pay | Admitting: *Deleted

## 2018-04-27 ENCOUNTER — Telehealth (HOSPITAL_COMMUNITY): Payer: Self-pay | Admitting: *Deleted

## 2018-04-27 ENCOUNTER — Inpatient Hospital Stay (HOSPITAL_COMMUNITY): Admission: RE | Admit: 2018-04-27 | Payer: PPO | Source: Ambulatory Visit

## 2018-04-27 NOTE — Telephone Encounter (Signed)
Patient paged this morning reporting low flow alarms overnight. VAD numbers stable: 5300 Flow: 3.9 PI: 4.9 Power: 3.8 He states he "feels fine." Confirms he has been taking Losartan 25mg  daily. He has not taken his morning medications yet. Will have him come to clinic this afternoon for BP check. Told him to call pager if he starts to feel bad or has further alarms. He verbalized understanding.   Emerson Monte RN [place] Coordinator  Office: [phone]  24/7 Pager: [phone]

## 2018-04-28 ENCOUNTER — Other Ambulatory Visit (HOSPITAL_COMMUNITY): Payer: Self-pay | Admitting: *Deleted

## 2018-04-28 DIAGNOSIS — Z7901 Long term (current) use of anticoagulants: Secondary | ICD-10-CM

## 2018-04-28 DIAGNOSIS — Z95811 Presence of heart assist device: Secondary | ICD-10-CM

## 2018-04-28 DIAGNOSIS — I5021 Acute systolic (congestive) heart failure: Secondary | ICD-10-CM

## 2018-04-29 ENCOUNTER — Ambulatory Visit (HOSPITAL_COMMUNITY): Payer: Self-pay | Admitting: Pharmacist

## 2018-04-29 ENCOUNTER — Ambulatory Visit (HOSPITAL_COMMUNITY)
Admission: RE | Admit: 2018-04-29 | Discharge: 2018-04-29 | Disposition: A | Discharge status: Home / Self Care | Payer: PPO | Source: Ambulatory Visit | Attending: Cardiology | Admitting: Cardiology

## 2018-04-29 ENCOUNTER — Encounter (HOSPITAL_COMMUNITY): Payer: Self-pay

## 2018-04-29 ENCOUNTER — Ambulatory Visit (HOSPITAL_BASED_OUTPATIENT_CLINIC_OR_DEPARTMENT_OTHER)
Admission: RE | Admit: 2018-04-29 | Discharge: 2018-04-29 | Disposition: A | Discharge status: Home / Self Care | Payer: PPO | Source: Ambulatory Visit | Attending: Internal Medicine | Admitting: Internal Medicine

## 2018-04-29 VITALS — BP 105/67 | HR 65 | Ht 70.0 in | Wt 221.8 lb

## 2018-04-29 DIAGNOSIS — Z880 Allergy status to penicillin: Secondary | ICD-10-CM | POA: Diagnosis not present

## 2018-04-29 DIAGNOSIS — Z7901 Long term (current) use of anticoagulants: Secondary | ICD-10-CM | POA: Insufficient documentation

## 2018-04-29 DIAGNOSIS — N183 Chronic kidney disease, stage 3 (moderate): Secondary | ICD-10-CM | POA: Diagnosis not present

## 2018-04-29 DIAGNOSIS — I5021 Acute systolic (congestive) heart failure: Secondary | ICD-10-CM

## 2018-04-29 DIAGNOSIS — Z794 Long term (current) use of insulin: Secondary | ICD-10-CM | POA: Diagnosis not present

## 2018-04-29 DIAGNOSIS — D649 Anemia, unspecified: Secondary | ICD-10-CM | POA: Insufficient documentation

## 2018-04-29 DIAGNOSIS — Z7982 Long term (current) use of aspirin: Secondary | ICD-10-CM | POA: Diagnosis not present

## 2018-04-29 DIAGNOSIS — I110 Hypertensive heart disease with heart failure: Secondary | ICD-10-CM | POA: Diagnosis not present

## 2018-04-29 DIAGNOSIS — I4891 Unspecified atrial fibrillation: Secondary | ICD-10-CM | POA: Insufficient documentation

## 2018-04-29 DIAGNOSIS — I130 Hypertensive heart and chronic kidney disease with heart failure and stage 1 through stage 4 chronic kidney disease, or unspecified chronic kidney disease: Secondary | ICD-10-CM | POA: Insufficient documentation

## 2018-04-29 DIAGNOSIS — I5043 Acute on chronic combined systolic (congestive) and diastolic (congestive) heart failure: Secondary | ICD-10-CM | POA: Diagnosis not present

## 2018-04-29 DIAGNOSIS — Z95810 Presence of automatic (implantable) cardiac defibrillator: Secondary | ICD-10-CM | POA: Diagnosis not present

## 2018-04-29 DIAGNOSIS — I480 Paroxysmal atrial fibrillation: Secondary | ICD-10-CM

## 2018-04-29 DIAGNOSIS — I082 Rheumatic disorders of both aortic and tricuspid valves: Secondary | ICD-10-CM | POA: Insufficient documentation

## 2018-04-29 DIAGNOSIS — I10 Essential (primary) hypertension: Secondary | ICD-10-CM

## 2018-04-29 DIAGNOSIS — I5022 Chronic systolic (congestive) heart failure: Secondary | ICD-10-CM | POA: Diagnosis not present

## 2018-04-29 DIAGNOSIS — I5082 Biventricular heart failure: Secondary | ICD-10-CM | POA: Insufficient documentation

## 2018-04-29 DIAGNOSIS — E1122 Type 2 diabetes mellitus with diabetic chronic kidney disease: Secondary | ICD-10-CM | POA: Diagnosis not present

## 2018-04-29 DIAGNOSIS — I255 Ischemic cardiomyopathy: Secondary | ICD-10-CM | POA: Diagnosis not present

## 2018-04-29 DIAGNOSIS — Z95811 Presence of heart assist device: Secondary | ICD-10-CM | POA: Diagnosis not present

## 2018-04-29 DIAGNOSIS — I2510 Atherosclerotic heart disease of native coronary artery without angina pectoris: Secondary | ICD-10-CM | POA: Diagnosis not present

## 2018-04-29 DIAGNOSIS — Z955 Presence of coronary angioplasty implant and graft: Secondary | ICD-10-CM | POA: Insufficient documentation

## 2018-04-29 DIAGNOSIS — Z79899 Other long term (current) drug therapy: Secondary | ICD-10-CM | POA: Diagnosis not present

## 2018-04-29 LAB — CBC
HCT: 45.7 % (ref 39.0–52.0)
Hemoglobin: 13.9 g/dL (ref 13.0–17.0)
MCH: 25.6 pg — ABNORMAL LOW (ref 26.0–34.0)
MCHC: 30.4 g/dL (ref 30.0–36.0)
MCV: 84.2 fL (ref 80.0–100.0)
Platelets: 197 10*3/uL (ref 150–400)
RBC: 5.43 MIL/uL (ref 4.22–5.81)
RDW: 18.6 % — ABNORMAL HIGH (ref 11.5–15.5)
WBC: 4.4 10*3/uL (ref 4.0–10.5)
nRBC: 0 % (ref 0.0–0.2)

## 2018-04-29 LAB — BASIC METABOLIC PANEL
Anion gap: 11 (ref 5–15)
BUN: 29 mg/dL — ABNORMAL HIGH (ref 8–23)
CO2: 24 mmol/L (ref 22–32)
Calcium: 9.2 mg/dL (ref 8.9–10.3)
Chloride: 99 mmol/L (ref 98–111)
Creatinine, Ser: 1.65 mg/dL — ABNORMAL HIGH (ref 0.61–1.24)
GFR calc Af Amer: 49 mL/min — ABNORMAL LOW (ref >60)
GFR calc non Af Amer: 42 mL/min — ABNORMAL LOW (ref >60)
Glucose, Bld: 153 mg/dL — ABNORMAL HIGH (ref 70–99)
Potassium: 4.3 mmol/L (ref 3.5–5.1)
SODIUM: 134 mmol/L — AB (ref 135–145)

## 2018-04-29 LAB — PROTIME-INR
INR: 2.3 — ABNORMAL HIGH (ref 0.8–1.2)
Prothrombin Time: 24.6 seconds — ABNORMAL HIGH (ref 11.4–15.2)

## 2018-04-29 LAB — LACTATE DEHYDROGENASE: LDH: 212 U/L — ABNORMAL HIGH (ref 98–192)

## 2018-04-29 NOTE — Progress Notes (Signed)
  Echocardiogram 2D Echocardiogram has been performed.  [name] [name] 04/29/2018, 11:26 AM

## 2018-04-29 NOTE — Addendum Note (Signed)
Encounter addended by: Jolaine Artist, MD on: 04/29/2018 8:54 PM  Actions taken: Clinical Note Signed, Visit diagnoses modified, LOS modified, Charge Capture section accepted

## 2018-04-29 NOTE — Progress Notes (Signed)
Patient presents for one week f/u in [facility] today with wife. Patient reports Low Flow alarms over last few days. Checked controller alarm history - revealed 5 LF alarms since 04/27/18.   Pt reports feeling "tired" at times and reports a few orthostatic dizzy spells. Says he is drinking @ 1.5 liters/fluid daily, has been unable to get up to 2L/day as instructed per Dr. [name]. Pt is fearful of "swelling". He has taken no prn torsemide or K+ since last visit.   Scheduled RAMP echo today: Speed  Flow  PI  Power  LVIDD  AI  Aortic openings  MR  TR  Septum  RV  5300 3.4 6.5 4.0 5.2 none 5/5 none none Bow to left Mild - mod HK  5200  3.2 6.2 3.7 5.3 none 5/5 none none Sl bow to left   5100 4.0 3.8 3.6 5.1 None  5/5 none none Bow to left  back  5100   3.4 6.2 3.6       supine  5000  3.1 6.7 3.4 5.2 none 5/5 none none Sl pull to left Left side  5000  2.9 7.7 3.4       supine   VTI: 22  Doppler MAP: 100 Auto cuff BP: 105/67 (85)   Ramp ECHO performed in [facility] per Dr. [name] At completion of ramp study, patients primary controller programmed:  Fixed speed: 5100 RPM Low speed limit: 4800 RPM   Vital Signs: Doppler Pressure:  100 Automatc BP: 105/67 (85) HR:  65 SPO2: 100 % on RA  Weight: 221.8 lb w/o eqt  Last weight: 224.8 lb w/o eqip      VAD Indication: Destination Therapy- Implanted 08/16/17   LVAD assessment: HM III: VAD Speed: 5300 rpms Flow: 3.4 Power: 4.0w    PI: 6.5 Alarms:  5 Low Flows noted on controller alarm history Events:  >120  PI events today Hct: 41  Fixed speed: 5300 Low speed limit: 5000  Primary: Back up battery expiring in 25 months. Secondary: Back up battery expiring in 28 months    I reviewed the LVAD parameters from today and compared the results to the patient's prior recorded data. LVAD interrogation was NEGATIVE for significant power changes, POSITIVE for clinical alarms and POSITIVE for PI events/speed drops. Speed  decreased to 5100 with ramp study today. Pt is performing daily controller and system monitor self tests along with completing weekly and monthly maintenance for LVAD equipment.  LVAD equipment check completed and is in good working order. Back-up equipment present.   Annual Equipment Maintenance on UBC/PM was performed on 09/17/17.   Exit Site Care: VAD dressing removed and site care performed using sterile technique. Drive line exit site cleaned with Chlora prep applicators x 2, allowed to dry, and gauze dressing with silver strip  applied. Exit site healing with partial tissue ingrowth.The velour is exposed 4 cm at exit site. Pt denies any trauma to site, but does report restless sleep habits. Small amount yellow/green drainage noted with no redness, tenderness, or foul odor noted; pt denies fevers or chills. Caregiver instructed to continue twice weekly dressing change with silver strip until further notice. Will obtain larger abdominal binder for patient. Wife says they have adequate dressing supplies at home.    Device: [employer] 3 Lead Therapies: ON:  VF 200 bpm, FVT 200 - 240; VT 158 - 200 - ATP Therapies: AT/AF monitor on 171 bpm Pacing: DDD 60 Last check: today 03/28/18 per Tomi Bamberger ([employer] rep)  Optivol check today - reviewed by Dr. [name].   Labs:  Hgb 13.9 - No S/S of bleeding. Specifically denies melena/BRBPR or nosebleeds.  LDH stable at 212 and is within established baseline ranging from 170-250.  Denies tea-colored urine. No power elevations noted on interrogation.   Creat slightly elevated today at 1.65 - pt is dry per Dr. [name].   Patient Instructions: 1. No change in meds 2. Increase fluid intake to 2 liters/day.  3. Continue twice weekly dressing changes, may advance to weekly dressing changes if drainage stops altogether. 4. Return to [facility] in 2 weeks.   Zada Girt RN [place] Coordinator  Office: [phone]  24/7 Pager: [phone]

## 2018-04-29 NOTE — Progress Notes (Signed)
VAD Clinic Note   HF: Dr. [name]   HPI: ELUTERIO SEYMOUR is a 68 y.o. male with h/o chronic systolic CHF due to ICM, s/p LVAD HM3 08/16/2017, [name], [name], [name], [name], [name], HTN, HL, and CKD III.  Admitted 08/11/17 with recurrent A/C systolic CHF. Milrinone added, but mixed venous saturation remained low. Seen by CT surgery/VAD team and deemed appropriate for HMIII under DT criteria. He underwent HMIII on 6/17. Chest was unable to be closed due to high intrathoracic pressure and RV failure.  He returned to the OR on 6/18 for evacuation of hematoma. Chest was later closed on 6/20. Post operative course complicated by  open chest, RV failure, recurrent VT, respiratory failure, and dysphagia. CCM consulted for vent management with extubation completed on 08/26/17.  As he improved all drips gradually weaned off. Prior to d/c  ramp echo was completed with speed optimized.  Spent 7/11 - 7/27 in CIR. Underwent hydrotherapy for his buttock wound(s). 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   Seen on 12/10/17 for VT at 180s. ATP attempted x 4, and then shocked with return to NSR. Started amio 200 bid. Saw EP in f/u and said to continue amio 200 bid for 3-4 weeks then got to 200 daily.   He presents today for f/u visit and ramp echo. Has been having low flow alarms recently. Feels pretty good but rundown at times. Family says he is not doing as much. No syncope or presyncope. Trying to drink more. Denies orthopnea or PND. No fevers, chills or problems with driveline. No bleeding, melena or neuro symptoms. Taking all meds as prescribed.   VAD Indication: Destination Therapy- Implanted 08/16/17   LVAD assessment: HM III: VAD Speed: 5300 rpms Flow: 3.4 Power: 4.0w PI: 6.5 Alarms:  5 Low Flows noted on controller alarm history Events:  >120  PI events  today Hct: 41  Fixed speed: 5300 Low speed limit: 5000  Primary: Back up battery expiring in 25 months. Secondary: Back up battery expiring in 28 months    I reviewed the LVAD parameters from todayand compared the results to the patient's prior recorded data.LVAD interrogation was NEGATIVEfor significant power changes, POSITIVEfor clinicalalarms and POSITIVE for PI events/speed drops. Speed decreased to 5100 with ramp study today. Pt is performing daily controller and system monitor self tests along with completing weekly and monthly maintenance for LVAD equipment.  LVAD equipment check completed and is in good working order. Back-up equipment present.   Annual Equipment Maintenance on UBC/PM was performed on 09/17/17.      Past Medical History:  Diagnosis Date  . AICD (automatic cardioverter/defibrillator) present 02/05/2014   Upgrade to Medtronic biventricular ICD, serial number  BLD [id number]   . Atrial flutter ([place]) 04/2012   s/p TEE-EPS+RFCA 04/2012  . CAD (coronary artery disease) 9381,0175 X 2    RCA-T, 70% PL (off CFX), 99% Prox LAD/90% Dist LAD, S/P TAXUS stent x 2  . CHF (congestive heart failure) ([place])   . Chronic anticoagulation   . Chronic systolic heart failure ([place])   . CKD (chronic kidney disease)   . Diabetic retinopathy ([place])   . DM type 2 (diabetes mellitus, type 2) ([facility])    insulin dependent  . HTN (hypertension)   . Hypercholesteremia    ablation  . ICD (implantable cardiac defibrillator) in place   . Ischemic  cardiomyopathy March 2015   20-25% 2D   . Nephrolithiasis   . Ventricular tachycardia ([place])     Current Outpatient Medications  Medication Sig Dispense Refill  . acetaminophen (TYLENOL) 500 MG tablet Take 1,000 mg by mouth every 6 (six) hours as needed.    Marland Kitchen amiodarone (PACERONE) 200 MG tablet Take one tablet in the AM.  Take 1/2 tablet in the PM 150 tablet 3  . ascorbic acid (VITAMIN C) 500 MG tablet Take 1 tablet (500 mg total)  by mouth 2 (two) times daily. 100 tablet 0  . aspirin 81 MG chewable tablet Chew 1 tablet (81 mg total) by mouth daily.    Marland Kitchen gabapentin (NEURONTIN) 300 MG capsule Take 1 capsule (300 mg total) by mouth 2 (two) times daily. 180 capsule 3  . insulin NPH-regular Human (70-30) 100 UNIT/ML injection Inject 30 Units into the skin daily.    . Insulin Pen Needle (COMFORT EZ PEN NEEDLES) 32G X 6 MM MISC 1 application by Does not apply route at bedtime. 100 each 0  . losartan (COZAAR) 25 MG tablet Take 1 tablet (25 mg total) by mouth daily. 30 tablet 6  . magnesium oxide (MAG-OX) 400 (241.3 Mg) MG tablet Take 1 tablet (400 mg total) by mouth 2 (two) times daily. 60 tablet 0  . Multiple Vitamin (MULTIVITAMIN WITH MINERALS) TABS tablet Take 1 tablet by mouth daily.    Marland Kitchen warfarin (COUMADIN) 2.5 MG tablet TAKE 1 TABLET BY MOUTH ONCE DAILY AT  6  PM 30 tablet 0  . potassium chloride (K-DUR) 10 MEQ tablet Take 2 tablets (20 mEq total) by mouth as needed. Only take when you take Torsemide (Patient not taking: Reported on 04/04/2018) 90 tablet 3  . torsemide (DEMADEX) 20 MG tablet Take 1 tablet (20 mg total) by mouth as needed. Only take Torsemide if home weight is over 216lbs (Patient not taking: Reported on 04/29/2018) 90 tablet 2   No current facility-administered medications for this encounter.    Allergies  Allergen Reactions  . Penicillins Hives, Itching and Other (See Comments)    Has patient had a PCN reaction causing immediate rash, facial/tongue/throat swelling, SOB or lightheadedness with hypotension:# # Yes # # Has patient had a PCN reaction causing severe rash involving mucus membranes or skin necrosis: No Has patient had a PCN reaction that required hospitalization: No Has patient had a PCN reaction occurring within the last 10 years: No If all of the above answers are "NO", then may proceed with Cephalosporin use.    Review of systems complete and found to be negative unless listed in HPI.    Wt  Readings from Last 3 Encounters:  04/29/18 100.6 kg (221 lb 12.8 oz)  04/04/18 102 kg (224 lb 12.8 oz)  04/01/18 99.1 kg (218 lb 8 oz)    Vitals:   04/29/18 1020 04/29/18 1021  BP: (!) 100/0 105/67  Pulse:  65  SpO2:  100%  Weight:  100.6 kg (221 lb 12.8 oz)  Height:  5\' 10"  (1.778 m)   Vital Signs: Doppler Pressure: 100 Automatc BP: 105/67 (85) HR:  65 SPO2: 100 % on RA  Weight: 221.8 lb w/o eqt  Last weight: 224.8 lb w/o eqip                                       Physical Exam: General:  NAD.  HEENT: normal  Neck: supple. JVP not elevated.  Carotids 2+ bilat; no bruits. No lymphadenopathy or thryomegaly appreciated. Cor: LVAD hum.  Lungs: Clear. Abdomen: obese soft, nontender, non-distended. No hepatosplenomegaly. No bruits or masses. Good bowel sounds. Driveline site clean. Anchor in place.  Extremities: no cyanosis, clubbing, rash. Warm no edema  Neuro: alert & oriented x 3. No focal deficits. Moves all 4 without problem    ASSESSMENT AND PLAN:  1. Chronic systolic CHF with biventricular failure-> cardiogenic shock: - Echo 08/13/2017 EF 20-25%.s/p Medtronic BiV ICD in place. Cath 12/18 with stable 1v CAD. s/p HM-3 implant 08/16/17.  - Doing fairly well NYHA II. A bit more fatigued recently. Volume status seems a bit low despite being off lasix.  - Encouraged him to be more active and to continue to drink fluids.  2. 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 - Ramp echo performed today for recurrent low flow alarms. I supervised entire study - LVEF 20-25%. Good cannula position. LV has shrunk in size. RV moderately down. AoV opening on every beat at 5300. Septum shifted to left. Speed decreased to 5100. (5000 looked good as well). RVOT VTI 22 - INR goal 2-2.5 INR 2.44  Discussed dosing with PharmD personally. 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 Doing well with double anchor. Site looks good.  3. CKD III: -  Creatinine up slightly to 1.6. Encouraged po intake   4. [name]/VF:  - ICD shock 12/09/17 with VT in 180s.  - Quiescent  - Continue amiodarone.  5. AFL/atrial fibrillation:  - S/p previous ablation. He is currently in NSR -Continue amio for VT.On coumadin as above.  - No change to current plan.   6. CAD s/p PCI of RCA and WNU:UVOZDG cath with stable CAD as above:  - No s/s of ischemia.    - Continue ASA and statin. He has completed CR 7. [name]: . - Per PCP.   8. Anemia:  - Hgb 13.9 9. HTN - Blood pressure well controlled. Continue current regimen.   Total time spent 45 minutes. Over half that time spent discussing above.   Glori Bickers, MD  04/29/18

## 2018-04-29 NOTE — H&P (View-Only) (Signed)
VAD Clinic Note   HF: Dr. [name]   HPI: [name] [name] is a 68 y.o. male with h/o chronic systolic CHF due to ICM, s/p LVAD HM3 08/16/2017, [name], [name], [name], [name], [name], HTN, HL, and CKD III.  Admitted 08/11/17 with recurrent A/C systolic CHF. Milrinone added, but mixed venous saturation remained low. Seen by CT surgery/VAD team and deemed appropriate for HMIII under DT criteria. He underwent HMIII on 6/17. Chest was unable to be closed due to high intrathoracic pressure and RV failure.  He returned to the OR on 6/18 for evacuation of hematoma. Chest was later closed on 6/20. Post operative course complicated by  open chest, RV failure, recurrent VT, respiratory failure, and dysphagia. CCM consulted for vent management with extubation completed on 08/26/17.  As he improved all drips gradually weaned off. Prior to d/c  ramp echo was completed with speed optimized.  Spent 7/11 - 7/27 in CIR. Underwent hydrotherapy for his buttock wound(s). 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   Seen on 12/10/17 for VT at 180s. ATP attempted x 4, and then shocked with return to NSR. Started amio 200 bid. Saw EP in f/u and said to continue amio 200 bid for 3-4 weeks then got to 200 daily.   He presents today for f/u visit and ramp echo. Has been having low flow alarms recently. Feels pretty good but rundown at times. Family says he is not doing as much. No syncope or presyncope. Trying to drink more. Denies orthopnea or PND. No fevers, chills or problems with driveline. No bleeding, melena or neuro symptoms. Taking all meds as prescribed.   VAD Indication: Destination Therapy- Implanted 08/16/17   LVAD assessment: HM III: VAD Speed: 5300 rpms Flow: 3.4 Power: 4.0w PI: 6.5 Alarms:  5 Low Flows noted on controller alarm history Events:  >120  PI events  today Hct: 41  Fixed speed: 5300 Low speed limit: 5000  Primary: Back up battery expiring in 25 months. Secondary: Back up battery expiring in 28 months    I reviewed the LVAD parameters from todayand compared the results to the patient's prior recorded data.LVAD interrogation was NEGATIVEfor significant power changes, POSITIVEfor clinicalalarms and POSITIVE for PI events/speed drops. Speed decreased to 5100 with ramp study today. Pt is performing daily controller and system monitor self tests along with completing weekly and monthly maintenance for LVAD equipment.  LVAD equipment check completed and is in good working order. Back-up equipment present.   Annual Equipment Maintenance on UBC/PM was performed on 09/17/17.      Past Medical History:  Diagnosis Date  . AICD (automatic cardioverter/defibrillator) present 02/05/2014   Upgrade to Medtronic biventricular ICD, serial number  BLD [id number]   . Atrial flutter ([place]) 04/2012   s/p TEE-EPS+RFCA 04/2012  . CAD (coronary artery disease) 3244,0102 X 2    RCA-T, 70% PL (off CFX), 99% Prox LAD/90% Dist LAD, S/P TAXUS stent x 2  . CHF (congestive heart failure) ([place])   . Chronic anticoagulation   . Chronic systolic heart failure ([place])   . CKD (chronic kidney disease)   . Diabetic retinopathy ([place])   . DM type 2 (diabetes mellitus, type 2) (H[facility])    insulin dependent  . HTN (hypertension)   . Hypercholesteremia    ablation  . ICD (implantable cardiac defibrillator) in place   . Ischemic  cardiomyopathy March 2015   20-25% 2D   . Nephrolithiasis   . Ventricular tachycardia ([place])     Current Outpatient Medications  Medication Sig Dispense Refill  . acetaminophen (TYLENOL) 500 MG tablet Take 1,000 mg by mouth every 6 (six) hours as needed.    Marland Kitchen amiodarone (PACERONE) 200 MG tablet Take one tablet in the AM.  Take 1/2 tablet in the PM 150 tablet 3  . ascorbic acid (VITAMIN C) 500 MG tablet Take 1 tablet (500 mg total)  by mouth 2 (two) times daily. 100 tablet 0  . aspirin 81 MG chewable tablet Chew 1 tablet (81 mg total) by mouth daily.    Marland Kitchen gabapentin (NEURONTIN) 300 MG capsule Take 1 capsule (300 mg total) by mouth 2 (two) times daily. 180 capsule 3  . insulin NPH-regular Human (70-30) 100 UNIT/ML injection Inject 30 Units into the skin daily.    . Insulin Pen Needle (COMFORT EZ PEN NEEDLES) 32G X 6 MM MISC 1 application by Does not apply route at bedtime. 100 each 0  . losartan (COZAAR) 25 MG tablet Take 1 tablet (25 mg total) by mouth daily. 30 tablet 6  . magnesium oxide (MAG-OX) 400 (241.3 Mg) MG tablet Take 1 tablet (400 mg total) by mouth 2 (two) times daily. 60 tablet 0  . Multiple Vitamin (MULTIVITAMIN WITH MINERALS) TABS tablet Take 1 tablet by mouth daily.    Marland Kitchen warfarin (COUMADIN) 2.5 MG tablet TAKE 1 TABLET BY MOUTH ONCE DAILY AT  6  PM 30 tablet 0  . potassium chloride (K-DUR) 10 MEQ tablet Take 2 tablets (20 mEq total) by mouth as needed. Only take when you take Torsemide (Patient not taking: Reported on 04/04/2018) 90 tablet 3  . torsemide (DEMADEX) 20 MG tablet Take 1 tablet (20 mg total) by mouth as needed. Only take Torsemide if home weight is over 216lbs (Patient not taking: Reported on 04/29/2018) 90 tablet 2   No current facility-administered medications for this encounter.    Allergies  Allergen Reactions  . Penicillins Hives, Itching and Other (See Comments)    Has patient had a PCN reaction causing immediate rash, facial/tongue/throat swelling, SOB or lightheadedness with hypotension:# # Yes # # Has patient had a PCN reaction causing severe rash involving mucus membranes or skin necrosis: No Has patient had a PCN reaction that required hospitalization: No Has patient had a PCN reaction occurring within the last 10 years: No If all of the above answers are "NO", then may proceed with Cephalosporin use.    Review of systems complete and found to be negative unless listed in HPI.    Wt  Readings from Last 3 Encounters:  04/29/18 100.6 kg (221 lb 12.8 oz)  04/04/18 102 kg (224 lb 12.8 oz)  04/01/18 99.1 kg (218 lb 8 oz)    Vitals:   04/29/18 1020 04/29/18 1021  BP: (!) 100/0 105/67  Pulse:  65  SpO2:  100%  Weight:  100.6 kg (221 lb 12.8 oz)  Height:  5\' 10"  (1.778 m)   Vital Signs: Doppler Pressure: 100 Automatc BP: 105/67 (85) HR:  65 SPO2: 100 % on RA  Weight: 221.8 lb w/o eqt  Last weight: 224.8 lb w/o eqip                                       Physical Exam: General:  NAD.  HEENT: normal  Neck: supple. JVP not elevated.  Carotids 2+ bilat; no bruits. No lymphadenopathy or thryomegaly appreciated. Cor: LVAD hum.  Lungs: Clear. Abdomen: obese soft, nontender, non-distended. No hepatosplenomegaly. No bruits or masses. Good bowel sounds. Driveline site clean. Anchor in place.  Extremities: no cyanosis, clubbing, rash. Warm no edema  Neuro: alert & oriented x 3. No focal deficits. Moves all 4 without problem    ASSESSMENT AND PLAN:  1. Chronic systolic CHF with biventricular failure-> cardiogenic shock: - Echo 08/13/2017 EF 20-25%.s/p Medtronic BiV ICD in place. Cath 12/18 with stable 1v CAD. s/p HM-3 implant 08/16/17.  - Doing fairly well NYHA II. A bit more fatigued recently. Volume status seems a bit low despite being off lasix.  - Encouraged him to be more active and to continue to drink fluids.  2. 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 - Ramp echo performed today for recurrent low flow alarms. I supervised entire study - LVEF 20-25%. Good cannula position. LV has shrunk in size. RV moderately down. AoV opening on every beat at 5300. Septum shifted to left. Speed decreased to 5100. (5000 looked good as well). RVOT VTI 22 - INR goal 2-2.5 INR 2.44  Discussed dosing with PharmD personally. 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 Doing well with double anchor. Site looks good.  3. CKD III: -  Creatinine up slightly to 1.6. Encouraged po intake   4. [name]/VF:  - ICD shock 12/09/17 with VT in 180s.  - Quiescent  - Continue amiodarone.  5. AFL/atrial fibrillation:  - S/p previous ablation. He is currently in NSR -Continue amio for VT.On coumadin as above.  - No change to current plan.   6. CAD s/p PCI of RCA and XJO:ITGPQD cath with stable CAD as above:  - No s/s of ischemia.    - Continue ASA and statin. He has completed CR 7. [name]: . - Per PCP.   8. Anemia:  - Hgb 13.9 9. HTN - Blood pressure well controlled. Continue current regimen.   Total time spent 45 minutes. Over half that time spent discussing above.   Glori Bickers, MD  04/29/18

## 2018-05-02 ENCOUNTER — Other Ambulatory Visit (HOSPITAL_COMMUNITY): Payer: Self-pay | Admitting: Unknown Physician Specialty

## 2018-05-02 ENCOUNTER — Telehealth (HOSPITAL_COMMUNITY): Payer: Self-pay | Admitting: Unknown Physician Specialty

## 2018-05-02 DIAGNOSIS — Z7901 Long term (current) use of anticoagulants: Secondary | ICD-10-CM

## 2018-05-02 DIAGNOSIS — Z95811 Presence of heart assist device: Secondary | ICD-10-CM

## 2018-05-02 NOTE — Addendum Note (Signed)
Addended by: Tanda Rockers B on: 05/02/2018 01:09 PM   Modules accepted: Orders

## 2018-05-02 NOTE — Telephone Encounter (Signed)
Pt paged multiple times over the weekend and again this morning low flow alarms. Pt states that he is drinking at least 2 L a day but the alarms persist. Per Dr. [name] CT of chest ordered to visualize pump placement and r/o any other issues. Order placed, [facility] notified about PA. Pt aware.  Tanda Rockers RN, BSN VAD Coordinator 24/7 Pager [phone]

## 2018-05-03 ENCOUNTER — Ambulatory Visit (INDEPENDENT_AMBULATORY_CARE_PROVIDER_SITE_OTHER): Payer: PPO

## 2018-05-03 ENCOUNTER — Telehealth: Payer: Self-pay

## 2018-05-03 ENCOUNTER — Ambulatory Visit (HOSPITAL_COMMUNITY)
Admission: RE | Admit: 2018-05-03 | Discharge: 2018-05-03 | Disposition: A | Discharge status: Home / Self Care | Payer: PPO | Source: Ambulatory Visit | Attending: Internal Medicine | Admitting: Internal Medicine

## 2018-05-03 DIAGNOSIS — I700 Atherosclerosis of aorta: Secondary | ICD-10-CM | POA: Diagnosis not present

## 2018-05-03 DIAGNOSIS — R918 Other nonspecific abnormal finding of lung field: Secondary | ICD-10-CM | POA: Insufficient documentation

## 2018-05-03 DIAGNOSIS — Z7901 Long term (current) use of anticoagulants: Secondary | ICD-10-CM | POA: Diagnosis not present

## 2018-05-03 DIAGNOSIS — I5022 Chronic systolic (congestive) heart failure: Secondary | ICD-10-CM | POA: Diagnosis not present

## 2018-05-03 DIAGNOSIS — J849 Interstitial pulmonary disease, unspecified: Secondary | ICD-10-CM | POA: Diagnosis not present

## 2018-05-03 DIAGNOSIS — Z95811 Presence of heart assist device: Secondary | ICD-10-CM | POA: Diagnosis not present

## 2018-05-03 DIAGNOSIS — Z95810 Presence of automatic (implantable) cardiac defibrillator: Secondary | ICD-10-CM | POA: Diagnosis not present

## 2018-05-03 NOTE — Telephone Encounter (Signed)
Left message for patient to remind of missed remote transmission.  

## 2018-05-04 ENCOUNTER — Other Ambulatory Visit (HOSPITAL_COMMUNITY): Payer: Self-pay | Admitting: Internal Medicine

## 2018-05-04 NOTE — Progress Notes (Signed)
EPIC Encounter for ICM Monitoring  Patient Name: [name] [name] is a 68 y.o. male Date: 05/04/2018 Primary Care Physican: [name], NP Primary Cardiologist:[name] Electrophysiologist: [name] Last Weight:212lbs  Today's Weight: 212 lbs                                                           Heart Failure questions reviewed and denied any fluid symptoms.  Patient reported HF clinic has instructed him to drink more fluids since he was dehydrated at last office visit.  He is having a heart catheterization tomorrow.    Report: Thoracic impedance starting to trend below baseline suggesting fluid accumulation starting 04/29/2018.   Prescribed: Torsemide 20 mg Take 1 tablet (20 mg total) by mouth as needed. Only take Torsemide if home weight is over 216 lbs  Labs: 04/29/2018 Creatinine 1.65, BUN 29, Potassium 4.3, Sodium 134, GFR 42-49 04/04/2018 Creatinine 1.34, BUN 24, Potassium 5.2, Sodium 137, GFR 54->60  03/28/2018 Creatinine 1.09, BUN 15, Potassium 4.4, Sodium 136, GFR >60 03/14/2018 Creatinine 1.19, BUN 23, Potassium 5.1, Sodium 136, GFR >60  02/28/2018 Creatinine 1.42, BUN 17, Potassium 4.8, Sodium 137, GFR 51-59  02/14/2018 Creatinine 1.56, BUN 26, Potassium 5.2, Sodium 133, GFR 45-52  02/11/2018 Creatinine 1.92, BUN 35, Potassium 4.4, Sodium 136, GFR 35-41 01/07/2018 Creatinine 1.34, BUN 29, Potassium 4.4, Sodium 136, GFR 53->60  12/31/2017 Creatinine 1.30, BUN 27, Potassium 4.4, Sodium 136, GFR 55->60  12/10/2017 Creatinine 1.14, BUN 35, Potassium 3.7, Sodium 135, GFR >60  12/03/2017 Creatinine 1.19, BUN 26, Potassium 3.8, Sodium 137, GFR >60  11/19/2017 Creatinine 1.13, BUN 30, Potassium 4.0, Sodium 139, GFR >60  10/26/2017 Creatinine 1.36, BUN 23, Potassium 3.6, Sodium 138, GFR >60 10/12/2017 Creatinine 1.09, BUN 19, Potassium 4.0, Sodium 136, GFR >60 10/07/2017 Creatinine 1.11, BUN 20, Potassium 4.8, Sodium 133, GFR >60 10/04/2017 Creatinine 1.38,  BUN 25, Potassium 5.5, Sodium 133, GFR 51-60 09/23/2017 Creatinine 1.02, BUN 15, Potassium 3.9, Sodium 133, GFR >60 A complete set of results can be found in results review  Recommendations:   Encouraged to call for fluid symptoms.  Follow-up plan: ICM clinic phone appointment on 06/06/2018.   Office appointment scheduled 05/19/2018 with Dr [name] and 05/20/2018 with [facility].    Copy of ICM check sent to Dr. [name] and Dr [name] for review and if any recommendations will call back.   3 month ICM trend: 05/03/2018    1 Year ICM trend:       [name], RN 05/04/2018 11:07 AM

## 2018-05-05 ENCOUNTER — Other Ambulatory Visit: Payer: Self-pay

## 2018-05-05 ENCOUNTER — Ambulatory Visit (HOSPITAL_COMMUNITY)
Admission: RE | Admit: 2018-05-05 | Discharge: 2018-05-05 | Disposition: A | Discharge status: Home / Self Care | Payer: PPO | Attending: Internal Medicine | Admitting: Internal Medicine

## 2018-05-05 ENCOUNTER — Encounter (HOSPITAL_COMMUNITY): Payer: Self-pay | Admitting: Unknown Physician Specialty

## 2018-05-05 ENCOUNTER — Ambulatory Visit (HOSPITAL_COMMUNITY): Payer: Self-pay | Admitting: Pharmacist

## 2018-05-05 ENCOUNTER — Encounter (HOSPITAL_COMMUNITY)
Admission: RE | Disposition: A | Discharge status: Home / Self Care | Payer: Self-pay | Source: Home / Self Care | Attending: Internal Medicine

## 2018-05-05 DIAGNOSIS — Z794 Long term (current) use of insulin: Secondary | ICD-10-CM | POA: Insufficient documentation

## 2018-05-05 DIAGNOSIS — I5082 Biventricular heart failure: Secondary | ICD-10-CM | POA: Insufficient documentation

## 2018-05-05 DIAGNOSIS — Z7901 Long term (current) use of anticoagulants: Secondary | ICD-10-CM | POA: Insufficient documentation

## 2018-05-05 DIAGNOSIS — E1122 Type 2 diabetes mellitus with diabetic chronic kidney disease: Secondary | ICD-10-CM | POA: Insufficient documentation

## 2018-05-05 DIAGNOSIS — I4891 Unspecified atrial fibrillation: Secondary | ICD-10-CM | POA: Diagnosis not present

## 2018-05-05 DIAGNOSIS — E11319 Type 2 diabetes mellitus with unspecified diabetic retinopathy without macular edema: Secondary | ICD-10-CM | POA: Insufficient documentation

## 2018-05-05 DIAGNOSIS — I255 Ischemic cardiomyopathy: Secondary | ICD-10-CM | POA: Diagnosis not present

## 2018-05-05 DIAGNOSIS — I5022 Chronic systolic (congestive) heart failure: Secondary | ICD-10-CM | POA: Diagnosis not present

## 2018-05-05 DIAGNOSIS — R1310 Dysphagia, unspecified: Secondary | ICD-10-CM | POA: Diagnosis not present

## 2018-05-05 DIAGNOSIS — Z79899 Other long term (current) drug therapy: Secondary | ICD-10-CM | POA: Insufficient documentation

## 2018-05-05 DIAGNOSIS — I2510 Atherosclerotic heart disease of native coronary artery without angina pectoris: Secondary | ICD-10-CM | POA: Insufficient documentation

## 2018-05-05 DIAGNOSIS — Z95811 Presence of heart assist device: Secondary | ICD-10-CM | POA: Diagnosis not present

## 2018-05-05 DIAGNOSIS — Z95810 Presence of automatic (implantable) cardiac defibrillator: Secondary | ICD-10-CM | POA: Diagnosis not present

## 2018-05-05 DIAGNOSIS — I4892 Unspecified atrial flutter: Secondary | ICD-10-CM | POA: Insufficient documentation

## 2018-05-05 DIAGNOSIS — Z7982 Long term (current) use of aspirin: Secondary | ICD-10-CM | POA: Insufficient documentation

## 2018-05-05 DIAGNOSIS — Z955 Presence of coronary angioplasty implant and graft: Secondary | ICD-10-CM | POA: Insufficient documentation

## 2018-05-05 DIAGNOSIS — E7800 Pure hypercholesterolemia, unspecified: Secondary | ICD-10-CM | POA: Insufficient documentation

## 2018-05-05 DIAGNOSIS — Z880 Allergy status to penicillin: Secondary | ICD-10-CM | POA: Diagnosis not present

## 2018-05-05 DIAGNOSIS — D649 Anemia, unspecified: Secondary | ICD-10-CM | POA: Diagnosis not present

## 2018-05-05 DIAGNOSIS — I130 Hypertensive heart and chronic kidney disease with heart failure and stage 1 through stage 4 chronic kidney disease, or unspecified chronic kidney disease: Secondary | ICD-10-CM | POA: Insufficient documentation

## 2018-05-05 DIAGNOSIS — N183 Chronic kidney disease, stage 3 (moderate): Secondary | ICD-10-CM | POA: Diagnosis not present

## 2018-05-05 HISTORY — PX: RIGHT HEART CATH: CATH118263

## 2018-05-05 LAB — POCT I-STAT EG7
ACID-BASE DEFICIT: 4 mmol/L — AB (ref 0.0–2.0)
Acid-base deficit: 4 mmol/L — ABNORMAL HIGH (ref 0.0–2.0)
BICARBONATE: 21 mmol/L (ref 20.0–28.0)
Bicarbonate: 20.3 mmol/L (ref 20.0–28.0)
Calcium, Ion: 1.26 mmol/L (ref 1.15–1.40)
Calcium, Ion: 1.15 mmol/L (ref 1.15–1.40)
HCT: 34 % — ABNORMAL LOW (ref 39.0–52.0)
HCT: 33 % — ABNORMAL LOW (ref 39.0–52.0)
HEMOGLOBIN: 11.2 g/dL — AB (ref 13.0–17.0)
Hemoglobin: 11.6 g/dL — ABNORMAL LOW (ref 13.0–17.0)
O2 Saturation: 68 %
O2 Saturation: 71 %
PO2 VEN: 36 mmHg (ref 32.0–45.0)
Potassium: 3.5 mmol/L (ref 3.5–5.1)
Potassium: 3.3 mmol/L — ABNORMAL LOW (ref 3.5–5.1)
Sodium: 144 mmol/L (ref 135–145)
Sodium: 146 mmol/L — ABNORMAL HIGH (ref 135–145)
TCO2: 22 mmol/L (ref 22–32)
TCO2: 21 mmol/L — ABNORMAL LOW (ref 22–32)
pCO2, Ven: 35.8 mmHg — ABNORMAL LOW (ref 44.0–60.0)
pCO2, Ven: 34.8 mmHg — ABNORMAL LOW (ref 44.0–60.0)
pH, Ven: 7.377 (ref 7.250–7.430)
pH, Ven: 7.374 (ref 7.250–7.430)
pO2, Ven: 37 mmHg (ref 32.0–45.0)

## 2018-05-05 LAB — GLUCOSE, CAPILLARY
GLUCOSE-CAPILLARY: 248 mg/dL — AB (ref 70–99)
Glucose-Capillary: 114 mg/dL — ABNORMAL HIGH (ref 70–99)
Glucose-Capillary: 35 mg/dL — CL (ref 70–99)
Glucose-Capillary: 52 mg/dL — ABNORMAL LOW (ref 70–99)
Glucose-Capillary: 62 mg/dL — ABNORMAL LOW (ref 70–99)

## 2018-05-05 LAB — PROTIME-INR
INR: 2.1 — ABNORMAL HIGH (ref 0.8–1.2)
Prothrombin Time: 23 seconds — ABNORMAL HIGH (ref 11.4–15.2)

## 2018-05-05 SURGERY — RIGHT HEART CATH
Anesthesia: LOCAL

## 2018-05-05 MED ORDER — HEPARIN (PORCINE) IN NACL 1000-0.9 UT/500ML-% IV SOLN
INTRAVENOUS | Status: DC | PRN
  Administered 2018-05-05 (×2): 500 mL

## 2018-05-05 MED ORDER — ACETAMINOPHEN 325 MG PO TABS: 650.0000 mg | ORAL_TABLET | ORAL | Status: DC | PRN

## 2018-05-05 MED ORDER — ASPIRIN 81 MG PO CHEW: 81.0000 mg | CHEWABLE_TABLET | ORAL | Status: DC

## 2018-05-05 MED ORDER — LIDOCAINE HCL (PF) 1 % IJ SOLN
INTRAMUSCULAR | Status: AC
  Filled 2018-05-05: qty 30

## 2018-05-05 MED ORDER — SODIUM CHLORIDE 0.9 % IV SOLN: 250.0000 mL | INTRAVENOUS | Status: DC | PRN

## 2018-05-05 MED ORDER — SODIUM CHLORIDE 0.9% FLUSH: 3.0000 mL | INTRAVENOUS | Status: DC | PRN

## 2018-05-05 MED ORDER — SODIUM CHLORIDE 0.9% FLUSH: 3.0000 mL | Freq: Two times a day (BID) | INTRAVENOUS | Status: DC

## 2018-05-05 MED ORDER — SODIUM CHLORIDE 0.9 % IV SOLN: INTRAVENOUS | Status: DC

## 2018-05-05 MED ORDER — ONDANSETRON HCL 4 MG/2ML IJ SOLN: 4.0000 mg | Freq: Four times a day (QID) | INTRAMUSCULAR | Status: DC | PRN

## 2018-05-05 MED ORDER — LIDOCAINE HCL (PF) 1 % IJ SOLN
INTRAMUSCULAR | Status: DC | PRN
  Administered 2018-05-05: 2 mL

## 2018-05-05 MED ORDER — HEPARIN (PORCINE) IN NACL 1000-0.9 UT/500ML-% IV SOLN
INTRAVENOUS | Status: AC
  Filled 2018-05-05: qty 500

## 2018-05-05 SURGICAL SUPPLY — 6 items
CATH BALLN WEDGE 5F 110CM (CATHETERS) ×1 IMPLANT
KIT HEART LEFT (KITS) ×2 IMPLANT
PACK CARDIAC CATHETERIZATION (CUSTOM PROCEDURE TRAY) ×2 IMPLANT
SHEATH GLIDE SLENDER 4/5FR (SHEATH) ×1 IMPLANT
TRANSDUCER W/STOPCOCK (MISCELLANEOUS) ×2 IMPLANT
TUBING CIL FLEX 10 FLL-RA (TUBING) ×2 IMPLANT

## 2018-05-05 NOTE — Interval H&P Note (Signed)
History and Physical Interval Note:  05/05/2018 1:35 PM  [name] [name]  has presented today for surgery, with the diagnosis of Heart Failure  The various methods of treatment have been discussed with the patient and family. After consideration of risks, benefits and other options for treatment, the patient has consented to  Procedure(s): RIGHT HEART CATH (N/A) as a surgical intervention .  The patient's history has been reviewed, patient examined, no change in status, stable for surgery.  I have reviewed the patient's chart and labs.  Questions were answered to the patient's satisfaction.     Keela Rubert

## 2018-05-05 NOTE — Progress Notes (Unsigned)
Accompanied pt to the cath lab for RHC w/ Dr. [name].   VAD parameters: Flow: 4.0 Speed: 5100 PI: 4.3 Power: 3.5  3-5 PI daily Pt has not had any low flows since dropping his HCT in clinic Tuesday.   BP: 106/82 (91) 65 NSR  Pt was not given any sedation during this procedure. No changes made to VAD settings. Pt given abdominal binder to stablize driveline. Pt has f/u appt on 3/20.  Tanda Rockers RN, BSN VAD Coordinator 24/7 Pager [phone]

## 2018-05-05 NOTE — Discharge Instructions (Signed)
Venogram, Care After  This sheet gives you information about how to care for yourself after your procedure. Your health care provider may also give you more specific instructions. If you have problems or questions, contact your health care provider.  What can I expect after the procedure?  After the procedure, it is common to have:   Bruising or mild discomfort in the area where the IV was inserted (insertion site).  Follow these instructions at home:  Eating and drinking     Follow instructions from your health care provider about eating or drinking restrictions.   Drink a lot of fluids for the first several days after the procedure, as directed by your health care provider. This helps to wash (flush) the contrast out of your body. Examples of healthy fluids include water or low-calorie drinks.  General instructions   Check your IV insertion area every day for signs of infection. Check for:  ? Redness, swelling, or pain.  ? Fluid or blood.  ? Warmth.  ? Pus or a bad smell.   Take over-the-counter and prescription medicines only as told by your health care provider.   Rest and return to your normal activities as told by your health care provider. Ask your health care provider what activities are safe for you.   Do not drive for 24 hours if you were given a medicine to help you relax (sedative), or until your health care provider approves.   Keep all follow-up visits as told by your health care provider. This is important.  Contact a health care provider if:   Your skin becomes itchy or you develop a rash or hives.   You have a fever that does not get better with medicine.   You feel nauseous.   You vomit.   You have redness, swelling, or pain around the insertion site.   You have fluid or blood coming from the insertion site.   Your insertion area feels warm to the touch.   You have pus or a bad smell coming from the insertion site.  Get help right away if:   You have difficulty breathing or  shortness of breath.   You develop chest pain.   You faint.   You feel very dizzy.  These symptoms may represent a serious problem that is an emergency. Do not wait to see if the symptoms will go away. Get medical help right away. Call your local emergency services (911 in the [place] Do not drive yourself to the hospital.  Summary   After your procedure, it is common to have bruising or mild discomfort in the area where the IV was inserted.   You should check your IV insertion area every day for signs of infection.   Take over-the-counter and prescription medicines only as told by your health care provider.   You should drink a lot of fluids for the first several days after the procedure to help flush the contrast from your body.  This information is not intended to replace advice given to you by your health care provider. Make sure you discuss any questions you have with your health care provider.  Document Released: 12/07/2012 Document Revised: 01/11/2016 Document Reviewed: 01/11/2016  Elsevier Interactive Patient Education  2019 [employer] Inc[employer]

## 2018-05-06 ENCOUNTER — Encounter (HOSPITAL_COMMUNITY): Payer: Self-pay | Admitting: Internal Medicine

## 2018-05-09 DIAGNOSIS — Z7901 Long term (current) use of anticoagulants: Secondary | ICD-10-CM | POA: Diagnosis not present

## 2018-05-10 ENCOUNTER — Ambulatory Visit (HOSPITAL_COMMUNITY): Payer: Self-pay | Admitting: Pharmacist

## 2018-05-10 DIAGNOSIS — Z7901 Long term (current) use of anticoagulants: Secondary | ICD-10-CM

## 2018-05-10 LAB — POCT INR: INR: 2.1 (ref 2.0–3.0)

## 2018-05-12 ENCOUNTER — Ambulatory Visit (INDEPENDENT_AMBULATORY_CARE_PROVIDER_SITE_OTHER): Payer: PPO | Admitting: *Deleted

## 2018-05-12 DIAGNOSIS — I255 Ischemic cardiomyopathy: Secondary | ICD-10-CM

## 2018-05-13 LAB — CUP PACEART REMOTE DEVICE CHECK
Battery Remaining Longevity: 8 mo
Battery Voltage: 2.92 V
Brady Statistic AP VP Percent: 16.17 %
Brady Statistic AP VS Percent: 0.87 %
Brady Statistic AS VP Percent: 80.97 %
Brady Statistic AS VS Percent: 1.99 %
Brady Statistic RV Percent Paced: 96.86 %
Date Time Interrogation Session: 20200313112207
HighPow Impedance: 36 Ohm
HighPow Impedance: 44 Ohm
Implantable Lead Implant Date: 20071219
Implantable Lead Implant Date: 20151207
Implantable Lead Implant Date: 20151207
Implantable Lead Location: 753858
Implantable Lead Location: 753859
Implantable Lead Location: 753860
Implantable Lead Model: 4598
Implantable Lead Model: 5076
Implantable Lead Model: 6947
Implantable Pulse Generator Implant Date: 20151207
Lead Channel Impedance Value: 304 Ohm
Lead Channel Impedance Value: 304 Ohm
Lead Channel Impedance Value: 323 Ohm
Lead Channel Impedance Value: 342 Ohm
Lead Channel Impedance Value: 380 Ohm
Lead Channel Impedance Value: 399 Ohm
Lead Channel Impedance Value: 437 Ohm
Lead Channel Impedance Value: 456 Ohm
Lead Channel Impedance Value: 513 Ohm
Lead Channel Impedance Value: 570 Ohm
Lead Channel Impedance Value: 646 Ohm
Lead Channel Impedance Value: 665 Ohm
Lead Channel Impedance Value: 703 Ohm
Lead Channel Pacing Threshold Amplitude: 1 V
Lead Channel Pacing Threshold Amplitude: 1 V
Lead Channel Pacing Threshold Amplitude: 1.125 V
Lead Channel Pacing Threshold Pulse Width: 0.4 ms
Lead Channel Pacing Threshold Pulse Width: 0.4 ms
Lead Channel Pacing Threshold Pulse Width: 0.4 ms
Lead Channel Sensing Intrinsic Amplitude: 1.625 mV
Lead Channel Sensing Intrinsic Amplitude: 1.625 mV
Lead Channel Sensing Intrinsic Amplitude: 5.75 mV
Lead Channel Sensing Intrinsic Amplitude: 5.75 mV
Lead Channel Setting Pacing Amplitude: 0.5 V
Lead Channel Setting Pacing Amplitude: 2 V
Lead Channel Setting Pacing Pulse Width: 0.03 ms
Lead Channel Setting Pacing Pulse Width: 0.4 ms
Lead Channel Setting Sensing Sensitivity: 0.3 mV
MDC IDC SET LEADCHNL LV PACING AMPLITUDE: 2.25 V
MDC IDC STAT BRADY RA PERCENT PACED: 16.95 %

## 2018-05-16 ENCOUNTER — Ambulatory Visit (HOSPITAL_COMMUNITY): Payer: Self-pay | Admitting: Pharmacist

## 2018-05-16 DIAGNOSIS — Z7901 Long term (current) use of anticoagulants: Secondary | ICD-10-CM

## 2018-05-16 LAB — POCT INR: INR: 2 (ref 2.0–3.0)

## 2018-05-17 ENCOUNTER — Telehealth (HOSPITAL_COMMUNITY): Payer: Self-pay | Admitting: Licensed Clinical Social Worker

## 2018-05-17 NOTE — Telephone Encounter (Signed)
CSW contacted patient to assure food, medications and confirmation of VAD pager number if concerns or emergency arise. Patient instructed to stay home and use of proper hygiene and call if needed.  Patient informed that VAD team will call the day before any upcoming appointments with instructions due to current CoVid 19 outbreak. Patient verbalizes understanding and denies any current concerns. Jackie Nely Dedmon, LCSW, CCSW-MCS [phone]  

## 2018-05-18 ENCOUNTER — Other Ambulatory Visit (HOSPITAL_COMMUNITY): Payer: Self-pay | Admitting: *Deleted

## 2018-05-18 DIAGNOSIS — Z95811 Presence of heart assist device: Secondary | ICD-10-CM

## 2018-05-19 ENCOUNTER — Encounter: Payer: Self-pay | Admitting: Cardiology

## 2018-05-19 ENCOUNTER — Encounter: Payer: PPO | Admitting: Internal Medicine

## 2018-05-19 NOTE — Progress Notes (Signed)
Remote ICD transmission.   

## 2018-05-20 ENCOUNTER — Encounter (HOSPITAL_COMMUNITY): Payer: PPO

## 2018-05-20 ENCOUNTER — Ambulatory Visit (HOSPITAL_COMMUNITY): Payer: Self-pay | Admitting: Pharmacist

## 2018-05-20 DIAGNOSIS — Z7901 Long term (current) use of anticoagulants: Secondary | ICD-10-CM

## 2018-05-20 LAB — POCT INR: INR: 1.3 — AB (ref 2.0–3.0)

## 2018-05-23 ENCOUNTER — Ambulatory Visit (HOSPITAL_COMMUNITY): Payer: Self-pay | Admitting: Pharmacist

## 2018-05-23 DIAGNOSIS — Z7901 Long term (current) use of anticoagulants: Secondary | ICD-10-CM

## 2018-05-23 LAB — POCT INR: INR: 2 (ref 2.0–3.0)

## 2018-06-01 ENCOUNTER — Telehealth (HOSPITAL_COMMUNITY): Payer: Self-pay | Admitting: Licensed Clinical Social Worker

## 2018-06-01 ENCOUNTER — Ambulatory Visit (HOSPITAL_COMMUNITY): Payer: Self-pay | Admitting: Pharmacist

## 2018-06-01 DIAGNOSIS — Z7901 Long term (current) use of anticoagulants: Secondary | ICD-10-CM

## 2018-06-01 LAB — POCT INR: INR: 2.4 (ref 2–3)

## 2018-06-01 NOTE — Telephone Encounter (Signed)
CSW spoke with patient's wife yesterday and she reports they are managing well at home. CSW inquired about food and medications and she states no needs at the moment. CSW offered support and will continue to be available as needed. Raquel Sarna, [place], [place]

## 2018-06-03 ENCOUNTER — Telehealth (HOSPITAL_COMMUNITY): Payer: Self-pay | Admitting: *Deleted

## 2018-06-03 DIAGNOSIS — Z95811 Presence of heart assist device: Secondary | ICD-10-CM

## 2018-06-03 DIAGNOSIS — T827XXA Infection and inflammatory reaction due to other cardiac and vascular devices, implants and grafts, initial encounter: Secondary | ICD-10-CM

## 2018-06-03 MED ORDER — DOXYCYCLINE HYCLATE 50 MG PO CAPS: 100.0000 mg | ORAL_CAPSULE | Freq: Two times a day (BID) | ORAL | 0 refills | Status: DC

## 2018-06-03 NOTE — Telephone Encounter (Signed)
Patient's wife sent picture of patients VAD drive line exit site today during dressing change. She has been changing dressing twice weekly using gauze (daily dressing kt) and noted today some fresh blood indicating trauma to site. Pt is wearing abdominal binder at night and keeps anchor on at all times. She says he has had no fevers, chills, no foul odor from site. Reviewed with Darrick Grinder, NP. Will send Rx for Doxy 100 bid x 10 days. Instructed wife to increase dressing changes to every other day or as needed to keep dressing dry and intact. She has dressing supplies at home, but will plan on coming in next week to pick more up.  Instructed her to call if patient develops fever, chills, tenderness, redness, or foul odor from exit site. She verbalized understanding of same.    Zada Girt RN, VAD Coordinator 24/7 VAD Pager: [phone]

## 2018-06-06 ENCOUNTER — Telehealth: Payer: Self-pay

## 2018-06-06 ENCOUNTER — Ambulatory Visit (INDEPENDENT_AMBULATORY_CARE_PROVIDER_SITE_OTHER): Payer: PPO

## 2018-06-06 ENCOUNTER — Other Ambulatory Visit: Payer: Self-pay

## 2018-06-06 DIAGNOSIS — Z95810 Presence of automatic (implantable) cardiac defibrillator: Secondary | ICD-10-CM | POA: Diagnosis not present

## 2018-06-06 DIAGNOSIS — M4727 Other spondylosis with radiculopathy, lumbosacral region: Secondary | ICD-10-CM | POA: Diagnosis not present

## 2018-06-06 DIAGNOSIS — M5416 Radiculopathy, lumbar region: Secondary | ICD-10-CM | POA: Diagnosis not present

## 2018-06-06 DIAGNOSIS — I5022 Chronic systolic (congestive) heart failure: Secondary | ICD-10-CM | POA: Diagnosis not present

## 2018-06-06 DIAGNOSIS — N390 Urinary tract infection, site not specified: Secondary | ICD-10-CM | POA: Diagnosis not present

## 2018-06-06 NOTE — Telephone Encounter (Signed)
Remote ICM transmission received.  Attempted call to patient regarding ICM remote transmission and no message left.

## 2018-06-06 NOTE — Progress Notes (Signed)
EPIC Encounter for ICM Monitoring  Patient Name: TIRAN SAUSEDA is a 68 y.o. male Date: 06/06/2018 Primary Care Physican: [name], NP Primary Cardiologist:[name] Electrophysiologist: [name] BiV Pacing: 96.9% LastWeight:212lbs     Battery ERI: Estimated at 9 months  Heart Failure questions reviewed and denied any fluid symptoms.    Report: Thoracic impedance trending close to baseline.  Prescribed: Torsemide20 mgTake 1 tablet (20 mg total) by mouth as needed. Only take Torsemide if home weight is over 216 lbs  Labs: 04/29/2018 Creatinine 1.65, BUN 29, Potassium 4.3, Sodium 134, GFR 42-49 04/04/2018 Creatinine 1.34, BUN 24, Potassium 5.2, Sodium 137, GFR 54->60  03/28/2018 Creatinine1.09, BUN15, Potassium4.4, Sodium136, GFR>60 03/14/2018 Creatinine1.19, BUN23, Potassium5.1, Sodium136, GFR>60  A complete set of results can be found in results review  Recommendations:Encouraged to call for fluid symptoms.  Follow-up plan: ICM clinic phone appointment on5/01/2019. VAD Office visit 06/17/2018.  Copy of ICM check sent to Dr.Bensimhon and Dr [name].  3 month ICM trend: 06/06/2018    1 Year ICM trend:       [name], RN 06/06/2018 1:15 PM

## 2018-06-10 ENCOUNTER — Telehealth (HOSPITAL_COMMUNITY): Payer: Self-pay | Admitting: *Deleted

## 2018-06-10 ENCOUNTER — Ambulatory Visit (HOSPITAL_COMMUNITY): Payer: Self-pay | Admitting: Pharmacist

## 2018-06-10 DIAGNOSIS — M1712 Unilateral primary osteoarthritis, left knee: Secondary | ICD-10-CM | POA: Diagnosis not present

## 2018-06-10 DIAGNOSIS — Z7901 Long term (current) use of anticoagulants: Secondary | ICD-10-CM

## 2018-06-10 LAB — POCT INR: INR: 2.3 (ref 2.0–3.0)

## 2018-06-10 NOTE — Telephone Encounter (Signed)
Spoke with [name] [name] regarding upcoming appointment 06/17/18. He says he has been feeling "really good." Neuropathy in legs and feet bothers him at times but he is taking his Neurontin which helps. Denies fever, shortness of breath, dizziness, falls, blood in urine or stool, or dark urine. Did have one minor nosebleed when INR was elevated but denies since then. Had an episode at the beginning of April with bleeding from drive line site. This has since resolved. Currently finishing course of Doxycycline that was prescribed at that time.   VAD #s:  Speed: 5100 Flow: 4.8 Power: 3.6 PI: 2.3  Denies alarms or issues with equipment. Drive line dressing changed yesterday- no further drainage noted. [name] is changing twice a week. Reports adequate dressing supplies.  Will reschedule appointment to 07/15/18 at 0900. Instructed patient to call if he needs to be seen before then. He verbalized understanding.   Emerson Monte RN [place] Coordinator  Office: [phone]  24/7 Pager: [phone]

## 2018-06-13 ENCOUNTER — Other Ambulatory Visit (HOSPITAL_COMMUNITY): Payer: Self-pay | Admitting: Internal Medicine

## 2018-06-16 ENCOUNTER — Encounter (HOSPITAL_COMMUNITY): Payer: PPO

## 2018-06-17 ENCOUNTER — Inpatient Hospital Stay (HOSPITAL_COMMUNITY): Admission: RE | Admit: 2018-06-17 | Payer: PPO | Source: Ambulatory Visit

## 2018-06-20 LAB — POCT INR: INR: 2.3 (ref 2–3)

## 2018-06-21 ENCOUNTER — Ambulatory Visit (HOSPITAL_COMMUNITY): Payer: Self-pay | Admitting: Pharmacist

## 2018-06-21 DIAGNOSIS — Z7901 Long term (current) use of anticoagulants: Secondary | ICD-10-CM

## 2018-06-27 ENCOUNTER — Telehealth: Payer: Self-pay | Admitting: Physician Assistant

## 2018-06-27 LAB — POCT INR: INR: 2.4 (ref 2.0–3.0)

## 2018-06-27 NOTE — Telephone Encounter (Signed)
Paged by answering service, patient is constipated for past few days.   The patient denies nausea, vomiting, fever, chest pain, palpitations, shortness of breath, orthopnea, PND, dizziness, syncope, cough, congestion, abdominal pain, hematochezia, melena, lower extremity edema.  No weight gain. Compliant with medications.  Advised to try OTC stool softer. If no improvement, call PCP.

## 2018-06-29 ENCOUNTER — Ambulatory Visit (HOSPITAL_COMMUNITY): Payer: Self-pay | Admitting: Pharmacist

## 2018-06-29 DIAGNOSIS — Z7901 Long term (current) use of anticoagulants: Secondary | ICD-10-CM

## 2018-07-01 ENCOUNTER — Telehealth (HOSPITAL_COMMUNITY): Payer: Self-pay | Admitting: Licensed Clinical Social Worker

## 2018-07-01 NOTE — Telephone Encounter (Signed)
Message left for return call to review needs for Covid 19 public health concerns and emergency VAD pager number. Jackie Rejeana Fadness, LCSW, CCSW-MCS [phone]  

## 2018-07-06 ENCOUNTER — Ambulatory Visit (HOSPITAL_COMMUNITY): Payer: Self-pay | Admitting: Pharmacist

## 2018-07-06 DIAGNOSIS — Z7901 Long term (current) use of anticoagulants: Secondary | ICD-10-CM

## 2018-07-06 LAB — POCT INR: INR: 2.7 (ref 2.0–3.0)

## 2018-07-11 ENCOUNTER — Ambulatory Visit (INDEPENDENT_AMBULATORY_CARE_PROVIDER_SITE_OTHER): Payer: PPO

## 2018-07-11 ENCOUNTER — Other Ambulatory Visit: Payer: Self-pay

## 2018-07-11 DIAGNOSIS — Z95810 Presence of automatic (implantable) cardiac defibrillator: Secondary | ICD-10-CM | POA: Diagnosis not present

## 2018-07-11 DIAGNOSIS — I5022 Chronic systolic (congestive) heart failure: Secondary | ICD-10-CM

## 2018-07-12 DIAGNOSIS — Z7901 Long term (current) use of anticoagulants: Secondary | ICD-10-CM | POA: Diagnosis not present

## 2018-07-12 LAB — POCT INR: INR: 1.9 — AB (ref 2.0–3.0)

## 2018-07-12 NOTE — Progress Notes (Signed)
EPIC Encounter for ICM Monitoring  Patient Name: [name] [name] is a 68 y.o. male Date: 07/12/2018 Primary Care Physican: [name], NP Primary Cardiologist:[name] Electrophysiologist: [name] BiV Pacing: 96.4% LastWeight:212lbs     Battery ERI: Estimated at 11 months  Transmission reviewed and results sent to patient via mychart.  Optivol Thoracic impedancenormal.  Prescribed: Torsemide20 mgTake 1 tablet (20 mg total) by mouth as needed. Only take Torsemide if home weight is over 216 lbs  Labs: 04/29/2018 Creatinine1.65, BUN29, Potassium4.3, Sodium134, JSE83-15 04/04/2018 Creatinine1.34, BUN24, Potassium5.2, VVOHYW737, GFR54->60 03/28/2018 Creatinine1.09, BUN15, Potassium4.4, Sodium136, GFR>60 03/14/2018 Creatinine1.19, BUN23, Potassium5.1, Sodium136, GFR>60  A complete set of results can be found in results review  Recommendations:Recommendation to limit salt and fluid intake.  Follow-up plan: ICM clinic phone appointment on6/01/2019.Virtual Visit 07/22/2018 with Dr Aundra Dubin  Copy of ICM check sent to Dr [name].   3 month ICM trend: 07/11/2018    1 Year ICM trend:       [name], RN 07/12/2018 10:44 AM

## 2018-07-13 ENCOUNTER — Ambulatory Visit (HOSPITAL_COMMUNITY): Payer: Self-pay | Admitting: Pharmacist

## 2018-07-13 DIAGNOSIS — Z7901 Long term (current) use of anticoagulants: Secondary | ICD-10-CM

## 2018-07-15 ENCOUNTER — Encounter (HOSPITAL_COMMUNITY): Payer: PPO

## 2018-07-18 ENCOUNTER — Telehealth: Payer: Self-pay | Admitting: Licensed Clinical Social Worker

## 2018-07-18 NOTE — Telephone Encounter (Signed)
CSW contacted wife to share link for virtual support group meeting today per patient's request. Wife looking forward to attending. Raquel Sarna, [place], [place]

## 2018-07-18 NOTE — Telephone Encounter (Signed)
CSW contacted patient/caregiver to follow up on status with coronavirus and if anything needed. Patient instructed to stay home and use of proper hygiene and call if needed. CSW shared information about LVAD Support Group meeting virtually today if interested. Patient denies any concerns at this time and verbalizes understanding of process for contacting VAD Coordinators if needed. CSW continues to follow as needed. Jackie Shaana Acocella, LCSW, CCSW-MCS [phone] 

## 2018-07-19 LAB — POCT INR: INR: 2.8 (ref 2–3)

## 2018-07-20 ENCOUNTER — Ambulatory Visit (HOSPITAL_COMMUNITY): Payer: Self-pay | Admitting: Pharmacist

## 2018-07-20 DIAGNOSIS — Z7901 Long term (current) use of anticoagulants: Secondary | ICD-10-CM

## 2018-07-22 ENCOUNTER — Ambulatory Visit (HOSPITAL_COMMUNITY)
Admission: RE | Admit: 2018-07-22 | Discharge: 2018-07-22 | Disposition: A | Discharge status: Home / Self Care | Payer: PPO | Source: Ambulatory Visit | Attending: Cardiology | Admitting: Cardiology

## 2018-07-22 ENCOUNTER — Other Ambulatory Visit: Payer: Self-pay

## 2018-07-27 LAB — POCT INR: INR: 2.7 (ref 2–3)

## 2018-07-28 ENCOUNTER — Ambulatory Visit (HOSPITAL_COMMUNITY): Payer: Self-pay | Admitting: Pharmacist

## 2018-07-28 DIAGNOSIS — Z7901 Long term (current) use of anticoagulants: Secondary | ICD-10-CM

## 2018-08-01 ENCOUNTER — Telehealth (HOSPITAL_COMMUNITY): Payer: Self-pay | Admitting: Licensed Clinical Social Worker

## 2018-08-01 NOTE — Telephone Encounter (Signed)
CSW contacted caregiver to follow up on status with coronavirus and if anything needed. Patient instructed to stay home and use of proper hygiene and call if needed. CSW shared information about LVAD Support Group meeting virtually today if interested. Patient denies any concerns at this time and verbalizes understanding of process for contacting VAD Coordinators if needed. CSW continues to follow as needed. Raquel Sarna, [place], [place]

## 2018-08-02 ENCOUNTER — Other Ambulatory Visit (HOSPITAL_COMMUNITY): Payer: Self-pay | Admitting: Unknown Physician Specialty

## 2018-08-02 DIAGNOSIS — Z95811 Presence of heart assist device: Secondary | ICD-10-CM

## 2018-08-02 DIAGNOSIS — Z7901 Long term (current) use of anticoagulants: Secondary | ICD-10-CM

## 2018-08-02 NOTE — Addendum Note (Signed)
Addended by: Tanda Rockers B on: 08/02/2018 12:35 PM   Modules accepted: Orders

## 2018-08-03 ENCOUNTER — Encounter (HOSPITAL_COMMUNITY): Payer: Self-pay

## 2018-08-03 ENCOUNTER — Ambulatory Visit (HOSPITAL_COMMUNITY)
Admission: RE | Admit: 2018-08-03 | Discharge: 2018-08-03 | Disposition: A | Discharge status: Home / Self Care | Payer: PPO | Source: Ambulatory Visit | Attending: Internal Medicine | Admitting: Internal Medicine

## 2018-08-03 ENCOUNTER — Other Ambulatory Visit: Payer: Self-pay

## 2018-08-03 ENCOUNTER — Ambulatory Visit (HOSPITAL_COMMUNITY): Payer: Self-pay | Admitting: Pharmacist

## 2018-08-03 VITALS — BP 96/69 | Wt 223.4 lb

## 2018-08-03 DIAGNOSIS — Z4800 Encounter for change or removal of nonsurgical wound dressing: Secondary | ICD-10-CM | POA: Insufficient documentation

## 2018-08-03 DIAGNOSIS — I472 Ventricular tachycardia, unspecified: Secondary | ICD-10-CM

## 2018-08-03 DIAGNOSIS — M79606 Pain in leg, unspecified: Secondary | ICD-10-CM | POA: Diagnosis not present

## 2018-08-03 DIAGNOSIS — Z7901 Long term (current) use of anticoagulants: Secondary | ICD-10-CM | POA: Diagnosis not present

## 2018-08-03 DIAGNOSIS — Z95811 Presence of heart assist device: Secondary | ICD-10-CM | POA: Insufficient documentation

## 2018-08-03 DIAGNOSIS — I5022 Chronic systolic (congestive) heart failure: Secondary | ICD-10-CM

## 2018-08-03 LAB — COMPREHENSIVE METABOLIC PANEL
ALT: 24 U/L (ref 0–44)
AST: 24 U/L (ref 15–41)
Albumin: 3.1 g/dL — ABNORMAL LOW (ref 3.5–5.0)
Alkaline Phosphatase: 138 U/L — ABNORMAL HIGH (ref 38–126)
Anion gap: 12 (ref 5–15)
BUN: 39 mg/dL — ABNORMAL HIGH (ref 8–23)
CO2: 27 mmol/L (ref 22–32)
Calcium: 9.7 mg/dL (ref 8.9–10.3)
Chloride: 101 mmol/L (ref 98–111)
Creatinine, Ser: 2.15 mg/dL — ABNORMAL HIGH (ref 0.61–1.24)
GFR calc Af Amer: 35 mL/min — ABNORMAL LOW (ref >60)
GFR calc non Af Amer: 31 mL/min — ABNORMAL LOW (ref >60)
Glucose, Bld: 264 mg/dL — ABNORMAL HIGH (ref 70–99)
Potassium: 5 mmol/L (ref 3.5–5.1)
Sodium: 140 mmol/L (ref 135–145)
Total Bilirubin: 1 mg/dL (ref 0.3–1.2)
Total Protein: 8.9 g/dL — ABNORMAL HIGH (ref 6.5–8.1)

## 2018-08-03 LAB — CBC
HCT: 39.8 % (ref 39.0–52.0)
Hemoglobin: 12.5 g/dL — ABNORMAL LOW (ref 13.0–17.0)
MCH: 26.6 pg (ref 26.0–34.0)
MCHC: 31.4 g/dL (ref 30.0–36.0)
MCV: 84.7 fL (ref 80.0–100.0)
Platelets: 231 10*3/uL (ref 150–400)
RBC: 4.7 MIL/uL (ref 4.22–5.81)
RDW: 16.1 % — ABNORMAL HIGH (ref 11.5–15.5)
WBC: 5.3 10*3/uL (ref 4.0–10.5)
nRBC: 0 % (ref 0.0–0.2)

## 2018-08-03 LAB — PROTIME-INR
INR: 2.7 — ABNORMAL HIGH (ref 0.8–1.2)
Prothrombin Time: 27.9 seconds — ABNORMAL HIGH (ref 11.4–15.2)

## 2018-08-03 LAB — PREALBUMIN: Prealbumin: 22.8 mg/dL (ref 18–38)

## 2018-08-03 LAB — MAGNESIUM: Magnesium: 2.1 mg/dL (ref 1.7–2.4)

## 2018-08-03 LAB — LACTATE DEHYDROGENASE: LDH: 180 U/L (ref 98–192)

## 2018-08-03 MED ORDER — WARFARIN SODIUM 2.5 MG PO TABS: 2.5000 mg | ORAL_TABLET | Freq: Every day | ORAL | 5 refills | Status: DC

## 2018-08-03 NOTE — Progress Notes (Addendum)
Patient presents for 2 month f/u in [facility] today with wife. Denies any issues with VAD equipment. Reports 4 low flows on Sunday.   Upon VAD interrogation >100 PI events with at least half being true suction. Reports he has been drinking "half a gallon" every day. Encouraged to increase fluid intake and to eat pickles or something with salt in it per Dr. [name].  Neuropathy has been a limiting factor with mobility. He is able to walk for 4 minutes at home before pain in legs and feet requires him to stop and rest. He denies shortness of breath with activity. Has an appt with Dr. Posey Pronto this Friday.   Vital Signs: Doppler Pressure: 80 Automatc BP: 96/69 (79) HR: 70 SPO2: 98% on RA  Weight: 223.4 lb w/o eqt  Last weight: 221.8 lb w/o eqip   VAD Indication: Destination Therapy- Implanted 08/16/17   LVAD assessment: HM III: VAD Speed: 5100 Flow: 4.2 Power: 3.5 w    PI: 4.5 Alarms:  4 LF on 5/31  Events:  >100 PI events today  Hct: 41  Fixed speed: 5300 Low speed limit: 5000  Primary: Back up battery expiring in 21 months. Secondary: Back up battery expiring in 28 months    I reviewed the LVAD parameters from today and compared the results to the patient's prior recorded data. LVAD interrogation was NEGATIVE for significant power changes, NEGATIVE for clinical alarms and POSITIVE for PI events/speed drops. No other changes were made and pump is functioning within specified parameters. Pt is performing daily controller and system monitor self tests along with completing weekly and monthly maintenance for LVAD equipment.  LVAD equipment check completed and is in good working order. Back-up equipment present.   Annual Equipment Maintenance on UBC/PM was performed on 08/03/2018.   Exit Site Care: VAD dressing removed and site care performed using sterile technique. Drive line exit site cleaned with Chlora prep applicators x 2, allowed to dry, and Sorbaview dressing with  biopatch applied. Exit site healing with  tissue ingrowth.The velour is exposed 4 cm at exit site. Pt denies any trauma to site. Reports wearing abdominal binder to bed "not every night." No drainage, redness, or odor noted. Denies fever or chills. Drive line anchor in place and secure. Caregiver instructed to advance to weekly dressing changes. Provided patient with 8 weekly kits.    Device: Medtronic 3 Lead Therapies: ON:  VF 200 bpm, FVT 200 - 240; VT 158 - 200 - ATP Therapies: AT/AF monitor on 171 bpm Pacing: DDD 60 Last check: today 03/28/18 per Tomi Bamberger ([employer] rep)    Labs:  Doppler BP 80 - reflective of MAP  Hgb 12.5  - No S/S of bleeding. Specifically denies melena/BRBPR or nosebleeds.  LDH stable at 180 and is within established baseline ranging from 170-250.  Denies tea-colored urine. No power elevations noted on interrogation.   1 year Intermacs follow up completed including:  Quality of Life, KCCQ-12, and Neurocognitive trail making.   Pt completed 800 feet during 6 minute walk.   Back up controller: 11V backup battery charged during this visit.   Batteries Manufacture Date: Number of uses: Re-calibration  07/02/2017 78-86 Performed by patient   Annual maintenance completed per [employer] on patient's MPU and universal Charity fundraiser.  AA batteries replaced in MPU.    Plan: 1. No medication changes today. 2. Drink drink drink!  3. Coumadin dosing per Lattie Haw PharmD 4. Return to [place] [facility] in 6 weeks for follow up.  Emerson Monte RN [place] Coordinator  Office: [phone]  24/7 Pager: [phone]

## 2018-08-03 NOTE — Patient Instructions (Addendum)
1. [place]!!  2. Lattie Haw PharmD will be in touch regarding Coumadin dosing. 3. Return to LVAD clinic in 6 weeks for follow up. Call if you continue to have low flows.

## 2018-08-04 ENCOUNTER — Encounter: Payer: Self-pay | Admitting: Neurology

## 2018-08-05 ENCOUNTER — Other Ambulatory Visit: Payer: Self-pay

## 2018-08-05 ENCOUNTER — Telehealth (INDEPENDENT_AMBULATORY_CARE_PROVIDER_SITE_OTHER): Payer: PPO | Admitting: Neurology

## 2018-08-05 VITALS — Ht 71.0 in | Wt 209.0 lb

## 2018-08-05 DIAGNOSIS — E0842 Diabetes mellitus due to underlying condition with diabetic polyneuropathy: Secondary | ICD-10-CM | POA: Diagnosis not present

## 2018-08-05 DIAGNOSIS — I739 Peripheral vascular disease, unspecified: Secondary | ICD-10-CM

## 2018-08-05 MED ORDER — GABAPENTIN 300 MG PO CAPS: 300.0000 mg | ORAL_CAPSULE | Freq: Every day | ORAL | 3 refills | Status: DC

## 2018-08-05 NOTE — Progress Notes (Signed)
   Virtual Visit via Video Note The purpose of this virtual visit is to provide medical care while limiting exposure to the novel coronavirus.    Consent was obtained for video visit:  Yes.   Answered questions that patient had about telehealth interaction:  Yes.   I discussed the limitations, risks, security and privacy concerns of performing an evaluation and management service by telemedicine. I also discussed with the patient that there may be a patient responsible charge related to this service. The patient expressed understanding and agreed to proceed.  Pt location: Home Physician Location: office Name of referring provider:  [name], NP I connected with [name] [name] at patients initiation/request on 08/05/2018 at 10:30 AM EDT by video enabled telemedicine application and verified that I am speaking with the correct person using two identifiers. Pt MRN:  [id number] Pt DOB:  07-24-1950 Video Participants:  [name] [name]   History of Present Illness: This is a 69 y.o. male with insulin-dependent diabetes mellitus, atrial flutter, CAD, CHF s/p ICD, s/p LVAD (07/2017) hypertension, hyperlipidemia, and CKD returning for follow-up of diabetic neuropathy.  He continues to have neuropathic pain involving the feet and lower legs.  He takes gabapentin 300 mg twice daily which provides adequate relief.  Unfortunately, his most recent labs on 6/3 shows worsening renal function with Cr 2.15, GFR 35.   He continues to complain of achy/throbbing pain in the legs which is triggered by walking, worse on the left. He can walk about 1-2 min before pain is triggered and then he has to stop and rest.  He has weakness in the legs when this occurs.  There is no numbness or worsening tingling . He does not have low back pain when this occurs.  These symptoms are different from the neuropathy he feels in the feet.    Observations/Objective:   Vitals:   08/04/18 0945  Weight: 209 lb (94.8 kg)   Height: 5\' 11"  (1.803 m)   Patient is awake, alert, and appears comfortable.  Oriented x 4.   Extraocular muscles are intact. No ptosis.  Face is symmetric.  Speech is not dysarthric.  Antigravity in all extremities.  No pronator drift. Gait appears slow, unassisted   Assessment and Plan:  1.  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  Left > right exertional leg pain, suggestive of vascular claudication.  Arterial studies of the legs from February 2019 showed moderate left lower extremity arterial disease.  I will contact Dr. [name] to see if he needs updated studies given his progressive symptoms.  I do not feel that he has neurogenic claudication as he denies low back pain.  The symptoms are not characteristic for neuropathy, which is not triggered by activity nor really alleviated by rest.   Follow Up Instructions:   I discussed the assessment and treatment plan with the patient. The patient was provided an opportunity to ask questions and all were answered. The patient agreed with the plan and demonstrated an understanding of the instructions.   The patient was advised to call back or seek an in-person evaluation if the symptoms worsen or if the condition fails to improve as anticipated.  Follow-up in 6 months  Total time spent:  25 minutes     Alda Berthold, DO

## 2018-08-09 NOTE — Progress Notes (Signed)
VAD Clinic Note   HF: Dr. [name]   HPI: [name] [name] is a 68 y.o. male with h/o chronic systolic CHF due to ICM, s/p LVAD HM3 08/16/2017, [name], [name], [name], [name], [name], HTN, HL, and CKD III.  Admitted 08/11/17 with recurrent A/C systolic CHF. Milrinone added, but mixed venous saturation remained low. Seen by CT surgery/VAD team and deemed appropriate for HMIII under DT criteria. He underwent HMIII on 6/17. Chest was unable to be closed due to high intrathoracic pressure and RV failure.  He returned to the OR on 6/18 for evacuation of hematoma. Chest was later closed on 6/20. Post operative course complicated by  open chest, RV failure, recurrent VT, respiratory failure, and dysphagia. CCM consulted for vent management with extubation completed on 08/26/17.  As he improved all drips gradually weaned off. Prior to d/c  ramp echo was completed with speed optimized.  Spent 7/11 - 7/27 in CIR. Underwent hydrotherapy for his buttock wound(s). 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   Seen on 12/10/17 for VT at 180s. ATP attempted x 4, and then shocked with return to NSR. Started amio 200 bid. Saw EP in f/u and said to continue amio 200 bid for 3-4 weeks then got to 200 daily.   Ramp echo 2/20: - LVEF 20-25%. Good cannula position. LV has shrunk in size. RV moderately down. AoV opening on every beat at 5300. Septum shifted to left. Speed decreased to 5100. (5000 looked good as well). RVOT VTI 22  RHC 05/05/18  VAD speed 5100  RA = 5 RV = 44/7 PA = 43/10 (22) PCW = 13 Fick cardiac output/index =4.9/2.3 PVR = 1.9 WU FA sat = 96% PA sat = 68%, 71%   He presents today for f/u visit. Overall feels good. Only limitation is bilateral leg pain and neuropathy. Has f/u with Dr. Posey Pronto this week. Can do all ADLs without proble, from breathing standpoint.  Has to slow down due to leg pain.  Denies orthopnea or PND. No fevers, chills or problems with driveline. No bleeding, melena or neuro symptoms. No VAD alarms. Taking all meds as prescribed.    VAD Indication: Destination Therapy- Implanted 08/16/17   LVAD assessment: HM III: VAD Speed: 5100 Flow: 4.2 Power: 3.5 w PI: 4.5 Alarms:  4 LF on 5/31  Events:  >100 PI events today  Hct: 41  Fixed speed: 5300 Low speed limit: 5000  Primary: Back up battery expiring in 21 months. Secondary: Back up battery expiring in 28 months    I reviewed the LVAD parameters from todayand compared the results to the patient's prior recorded data.LVAD interrogation was NEGATIVEfor significant power changes, NEGATIVEfor clinicalalarms and POSITIVE for PI events/speed drops. No other changes were madeand pump is functioning within specified parameters. Pt is performing daily controller and system monitor self tests along with completing weekly and monthly maintenance for LVAD equipment.  LVAD equipment check completed and is in good working order. Back-up equipment present.   Annual Equipment Maintenance on UBC/PM was performed on 08/03/2018.    Past Medical History:  Diagnosis Date  . AICD (automatic cardioverter/defibrillator) present 02/05/2014   Upgrade to Medtronic biventricular ICD, serial number  [id number]   . Atrial flutter ([place]) 04/2012   s/p TEE-EPS+RFCA 04/2012  . CAD (coronary artery disease) 4270,6237 X 2    RCA-T, 70% PL (  off CFX), 99% Prox LAD/90% Dist LAD, S/P TAXUS stent x 2  . CHF (congestive heart failure) ([place])   . Chronic anticoagulation   . Chronic systolic heart failure ([place])   . CKD (chronic kidney disease)   . Diabetic retinopathy ([place])   . DM type 2 (diabetes mellitus, type 2) ([facility])    insulin dependent  . HTN (hypertension)   . Hypercholesteremia    ablation  . ICD (implantable cardiac defibrillator) in place   . Ischemic cardiomyopathy March  2015   20-25% 2D   . Nephrolithiasis   . Ventricular tachycardia ([place])     Current Outpatient Medications  Medication Sig Dispense Refill  . acetaminophen (TYLENOL) 500 MG tablet Take 1,000 mg by mouth every 6 (six) hours as needed for moderate pain or headache.     Marland Kitchen amiodarone (PACERONE) 200 MG tablet Take one tablet in the AM.  Take 1/2 tablet in the PM (Patient taking differently: Take 100-200 mg by mouth See admin instructions. Take 200 mg in the morning and 100 mg in the eveningTake one tablet in the AM.  Take 1/2 tablet in the PM) 150 tablet 3  . ascorbic acid (VITAMIN C) 500 MG tablet Take 1 tablet (500 mg total) by mouth 2 (two) times daily. 100 tablet 0  . aspirin 81 MG chewable tablet Chew 1 tablet (81 mg total) by mouth daily.    . insulin NPH-regular Human (70-30) 100 UNIT/ML injection Inject 30 Units into the skin daily.    . Insulin Pen Needle (COMFORT EZ PEN NEEDLES) 32G X 6 MM MISC 1 application by Does not apply route at bedtime. 100 each 0  . losartan (COZAAR) 25 MG tablet Take 1 tablet (25 mg total) by mouth daily. (Patient taking differently: Take 25 mg by mouth every evening. ) 30 tablet 6  . magnesium oxide (MAG-OX) 400 (241.3 Mg) MG tablet Take 1 tablet (400 mg total) by mouth 2 (two) times daily. 60 tablet 0  . Multiple Vitamin (MULTIVITAMIN WITH MINERALS) TABS tablet Take 1 tablet by mouth daily.    Marland Kitchen gabapentin (NEURONTIN) 300 MG capsule Take 1 capsule (300 mg total) by mouth at bedtime. 90 capsule 3  . warfarin (COUMADIN) 2.5 MG tablet Take 1 tablet (2.5 mg total) by mouth daily at 6 PM for 30 days. 30 tablet 5   No current facility-administered medications for this encounter.    Allergies  Allergen Reactions  . Penicillins Hives, Itching and Other (See Comments)    Did it involve swelling of the face/tongue/throat, SOB, or low BP? No Did it involve sudden or severe rash/hives, skin peeling, or any reaction on the inside of your mouth or nose? No Did you need to  seek medical attention at a hospital or doctor's office? Unknown When did it last happen?childhood allergy If all above answers are "NO", may proceed with cephalosporin use.    Marland Kitchen Alcohol-Sulfur [Sulfur] Other (See Comments)    Burns skin   Review of systems complete and found to be negative unless listed in HPI.    Wt Readings from Last 3 Encounters:  08/04/18 94.8 kg (209 lb)  08/03/18 101.3 kg (223 lb 6.4 oz)  05/05/18 96.6 kg (213 lb)    Vitals:   08/03/18 1027 08/03/18 1028  BP: (!) 80/0 96/69  SpO2: 98%   Weight: 101.3 kg (223 lb 6.4 oz)    Vital Signs: Doppler Pressure:80 Automatc BP: 96/69 (79) HR: 70 SPO2: 98% on RA  Weight: 223.4  lb w/o eqt  Last weight: 221.8 lb w/o eqip  Physical Exam: General:  NAD.  HEENT: normal  Neck: supple. JVP not elevated.  Carotids 2+ bilat; no bruits. No lymphadenopathy or thryomegaly appreciated. Cor: LVAD hum.  Lungs: Clear. Abdomen: obese soft, nontender, non-distended. No hepatosplenomegaly. No bruits or masses. Good bowel sounds. Driveline site clean. Anchor in place.  Extremities: no cyanosis, clubbing, rash. Warm no edema  Neuro: alert & oriented x 3. No focal deficits. Walks with limp   ASSESSMENT AND PLAN:  1. Chronic systolic CHF with biventricular failure-> cardiogenic shock: - Echo 08/13/2017 EF 20-25%.s/p Medtronic BiV ICD in place. Cath 12/18 with stable 1v CAD. s/p HM-3 implant 08/16/17.  - Doing well NYHA II. Limited by leg pain/neuropathy 2. 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 - Still with frequent PI events - Ramp echo 2/20: LVEF 20-25%. Good cannula position. LV has shrunk in size. RV moderately down. AoV opening on every beat at 5300. Septum shifted to left. Speed decreased to 5100. (5000 looked good as well). RVOT VTI 22 - RHC 3/20. Well compensated. No significant RV failure. RA = 5 - INR goal 2-2.5 INR 2.7  Discussed dosing with PharmD personally. 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 No change,.   3. Acute on CKD III: - Creatinine up 1.6-> 2.15. Encouraged po intake  4. [name]/VF:  - ICD shock 12/09/17 with VT in 180s.  - Quiescent  - Continue amiodarone.  5. AFL/atrial fibrillation:  - S/p previous ablation. He is currently in NSR -Continue amio for VT.On coumadin as above.  - No change to current plan.   6. CAD s/p PCI of RCA and BJY:NWGNFA cath with stable CAD as above:  - No s/s of ischemia.    - Continue ASA and statin. He has completed CR 7. [name]: . - Per PCP.   8. Anemia:  - Hgb 12.5 9. HTN - Blood pressure well controlled. Continue current regimen 10. Leg pain - ? Neuropathic vs vascular. Has f.u with Dr. Posey Pronto in Neuro - plan ABIs    Total time spent 35 minutes. Over half that time spent discussing above.   Glori Bickers, MD  08/09/18

## 2018-08-09 NOTE — Addendum Note (Signed)
Encounter addended by: Jolaine Artist, MD on: 08/09/2018 10:50 PM  Actions taken: Follow-up modified, Visit diagnoses modified, LOS modified, Charge Capture section accepted, Clinical Note Signed

## 2018-08-11 ENCOUNTER — Ambulatory Visit (INDEPENDENT_AMBULATORY_CARE_PROVIDER_SITE_OTHER): Payer: PPO | Admitting: *Deleted

## 2018-08-11 DIAGNOSIS — I472 Ventricular tachycardia, unspecified: Secondary | ICD-10-CM

## 2018-08-11 LAB — CUP PACEART REMOTE DEVICE CHECK
Battery Remaining Longevity: 15 mo
Battery Voltage: 2.92 V
Brady Statistic AP VP Percent: 83.05 %
Brady Statistic AP VS Percent: 2.4 %
Brady Statistic AS VP Percent: 13.93 %
Brady Statistic AS VS Percent: 0.62 %
Brady Statistic RA Percent Paced: 85.38 %
Brady Statistic RV Percent Paced: 96.93 %
Date Time Interrogation Session: 20200611133627
HighPow Impedance: 37 Ohm
HighPow Impedance: 47 Ohm
Implantable Lead Implant Date: 20071219
Implantable Lead Implant Date: 20151207
Implantable Lead Implant Date: 20151207
Implantable Lead Location: 753858
Implantable Lead Location: 753859
Implantable Lead Location: 753860
Implantable Lead Model: 4598
Implantable Lead Model: 5076
Implantable Lead Model: 6947
Implantable Pulse Generator Implant Date: 20151207
Lead Channel Impedance Value: 304 Ohm
Lead Channel Impedance Value: 304 Ohm
Lead Channel Impedance Value: 342 Ohm
Lead Channel Impedance Value: 342 Ohm
Lead Channel Impedance Value: 342 Ohm
Lead Channel Impedance Value: 437 Ohm
Lead Channel Impedance Value: 456 Ohm
Lead Channel Impedance Value: 494 Ohm
Lead Channel Impedance Value: 570 Ohm
Lead Channel Impedance Value: 570 Ohm
Lead Channel Impedance Value: 722 Ohm
Lead Channel Impedance Value: 722 Ohm
Lead Channel Impedance Value: 760 Ohm
Lead Channel Pacing Threshold Amplitude: 0.875 V
Lead Channel Pacing Threshold Amplitude: 1 V
Lead Channel Pacing Threshold Amplitude: 1.5 V
Lead Channel Pacing Threshold Pulse Width: 0.4 ms
Lead Channel Pacing Threshold Pulse Width: 0.4 ms
Lead Channel Pacing Threshold Pulse Width: 0.4 ms
Lead Channel Sensing Intrinsic Amplitude: 2 mV
Lead Channel Sensing Intrinsic Amplitude: 5.375 mV
Lead Channel Setting Pacing Amplitude: 0.5 V
Lead Channel Setting Pacing Amplitude: 1.75 V
Lead Channel Setting Pacing Amplitude: 2.5 V
Lead Channel Setting Pacing Pulse Width: 0.03 ms
Lead Channel Setting Pacing Pulse Width: 0.4 ms
Lead Channel Setting Sensing Sensitivity: 0.3 mV

## 2018-08-12 ENCOUNTER — Ambulatory Visit (INDEPENDENT_AMBULATORY_CARE_PROVIDER_SITE_OTHER): Payer: PPO

## 2018-08-12 ENCOUNTER — Other Ambulatory Visit: Payer: Self-pay

## 2018-08-12 DIAGNOSIS — Z95810 Presence of automatic (implantable) cardiac defibrillator: Secondary | ICD-10-CM

## 2018-08-12 DIAGNOSIS — I5022 Chronic systolic (congestive) heart failure: Secondary | ICD-10-CM

## 2018-08-12 NOTE — Progress Notes (Signed)
EPIC Encounter for ICM Monitoring  Patient Name: [name] [name] is a 68 y.o. male Date: 08/12/2018 Primary Care Physican: [name], NP Primary Cardiologist:[name] Electrophysiologist: [name] BiV Pacing: 96.9% LastWeight:209-212lbs   Battery ERI: Estimated at 14 months  Transmission reviewed.  Patient reports HF clinic advised him to drink more fluids and eat salt due he was dehydrated.  He reports feeling fine.    Optivol Thoracic impedanceabnormal suggesting possible fluid accumulation.  Prescribed: no longer prescribed diuretic.  Labs: 08/03/2018 Creatinine 2.15, BUN 39, Potassium 5.0, Sodium 140, GFR 31-35 04/29/2018 Creatinine1.65, BUN29, Potassium4.3, Sodium134, [id number] 04/04/2018 Creatinine1.34, BUN24, Potassium5.2, [id number], GFR54->60 03/28/2018 Creatinine1.09, BUN15, Potassium4.4, Sodium136, GFR>60 03/14/2018 Creatinine1.19, BUN23, Potassium5.1, Sodium136, GFR>60  A complete set of results can be found in results review  Recommendations:Advised if he experiences fluid symptoms to call back or call HF clinic.   Follow-up plan: ICM clinic phone appointment on6/29/2020 to recheck fluid levels.  Copy of ICM check sent to Dr [name] and Dr [name].    3 month ICM trend: 08/12/2018    1 Year ICM trend:       [name], RN 08/12/2018 3:21 PM

## 2018-08-17 ENCOUNTER — Encounter: Payer: Self-pay | Admitting: Cardiology

## 2018-08-17 NOTE — Progress Notes (Signed)
Remote ICD transmission.   

## 2018-08-19 ENCOUNTER — Ambulatory Visit (HOSPITAL_COMMUNITY): Payer: Self-pay | Admitting: Pharmacist

## 2018-08-19 DIAGNOSIS — Z7901 Long term (current) use of anticoagulants: Secondary | ICD-10-CM

## 2018-08-19 LAB — POCT INR: INR: 1.5 — AB (ref 2.0–3.0)

## 2018-08-22 ENCOUNTER — Ambulatory Visit (HOSPITAL_COMMUNITY): Payer: Self-pay | Admitting: Pharmacist

## 2018-08-22 DIAGNOSIS — Z7901 Long term (current) use of anticoagulants: Secondary | ICD-10-CM

## 2018-08-22 LAB — POCT INR: INR: 1.9 — AB (ref 2.0–3.0)

## 2018-08-29 ENCOUNTER — Ambulatory Visit (INDEPENDENT_AMBULATORY_CARE_PROVIDER_SITE_OTHER): Payer: PPO

## 2018-08-29 DIAGNOSIS — I5022 Chronic systolic (congestive) heart failure: Secondary | ICD-10-CM

## 2018-08-29 DIAGNOSIS — Z95810 Presence of automatic (implantable) cardiac defibrillator: Secondary | ICD-10-CM

## 2018-08-29 LAB — POCT INR: INR: 2.1 (ref 2–3)

## 2018-08-30 ENCOUNTER — Ambulatory Visit (HOSPITAL_COMMUNITY): Payer: Self-pay | Admitting: Pharmacist

## 2018-08-30 DIAGNOSIS — Z7901 Long term (current) use of anticoagulants: Secondary | ICD-10-CM

## 2018-08-30 NOTE — Progress Notes (Signed)
EPIC Encounter for ICM Monitoring  Patient Name: [name] [name] is a 68 y.o. male Date: 08/30/2018 Primary Care Physican: [name], NP Primary Cardiologist:[name] Electrophysiologist: [name] BiV Pacing: 98.2% LastWeight:209-212lbs   Battery ERI: Estimated at5months  Transmission reviewed.      OptivolThoracic impedancereturned to normal.  Prescribed: no longer prescribed diuretic.  Labs: 08/03/2018 Creatinine 2.15, BUN 39, Potassium 5.0, Sodium 140, GFR 31-35 04/29/2018 Creatinine1.65, BUN29, Potassium4.3, Sodium134, [id number] 04/04/2018 Creatinine1.34, BUN24, Potassium5.2, [id number], GFR54->60 03/28/2018 Creatinine1.09, BUN15, Potassium4.4, Sodium136, GFR>60 03/14/2018 Creatinine1.19, BUN23, Potassium5.1, Sodium136, GFR>60  A complete set of results can be found in results review  Recommendations:None  Follow-up plan: ICM clinic phone appointment on7/20/2020.  Copy of ICM check sent to Dr [name].  3 month ICM trend: 08/29/2018    1 Year ICM trend:       [name], RN 08/30/2018 4:56 PM

## 2018-09-01 ENCOUNTER — Telehealth (HOSPITAL_COMMUNITY): Payer: Self-pay | Admitting: Licensed Clinical Social Worker

## 2018-09-01 NOTE — Telephone Encounter (Signed)
CSW contacted caregiver to follow up on status with coronavirus and if anything needed. Patient instructed to stay home and use of proper hygiene and call if needed. CSW shared information about LVAD Support Group meeting virtually Monday July 6th if interested. Patient denies any concerns at this time and verbalizes understanding of process for contacting VAD Coordinators if needed. CSW continues to follow as needed. [name] [name], [place], [place]

## 2018-09-06 ENCOUNTER — Ambulatory Visit (HOSPITAL_COMMUNITY): Payer: Self-pay | Admitting: Pharmacist

## 2018-09-06 DIAGNOSIS — Z7901 Long term (current) use of anticoagulants: Secondary | ICD-10-CM

## 2018-09-06 LAB — POCT INR: INR: 1.9 — AB (ref 2.0–3.0)

## 2018-09-09 ENCOUNTER — Encounter (HOSPITAL_COMMUNITY): Payer: Self-pay | Admitting: *Deleted

## 2018-09-13 DIAGNOSIS — Z7901 Long term (current) use of anticoagulants: Secondary | ICD-10-CM | POA: Diagnosis not present

## 2018-09-15 ENCOUNTER — Ambulatory Visit (HOSPITAL_COMMUNITY): Payer: Self-pay | Admitting: Pharmacist

## 2018-09-15 DIAGNOSIS — Z7901 Long term (current) use of anticoagulants: Secondary | ICD-10-CM

## 2018-09-15 LAB — POCT INR: INR: 2 (ref 2.0–3.0)

## 2018-09-16 ENCOUNTER — Encounter (HOSPITAL_COMMUNITY): Payer: PPO

## 2018-09-19 ENCOUNTER — Ambulatory Visit (INDEPENDENT_AMBULATORY_CARE_PROVIDER_SITE_OTHER): Payer: PPO

## 2018-09-19 DIAGNOSIS — Z95810 Presence of automatic (implantable) cardiac defibrillator: Secondary | ICD-10-CM

## 2018-09-19 DIAGNOSIS — I5022 Chronic systolic (congestive) heart failure: Secondary | ICD-10-CM | POA: Diagnosis not present

## 2018-09-20 NOTE — Progress Notes (Signed)
EPIC Encounter for ICM Monitoring  Patient Name: [name] [name] is a 68 y.o. male Date: 09/20/2018 Primary Care Physican: [name], NP Primary Cardiologist:[name] Electrophysiologist: [name] BiV Pacing: 98.0% LastWeight:209-212lbs  Battery ERI: Estimated at81months  Transmission reviewed.    OptivolThoracic impedancenormal.  Prescribed:no longer prescribed diuretic.  Labs: 08/03/2018 Creatinine 2.15, BUN 39, Potassium 5.0, Sodium 140, GFR 31-35 A complete set of results can be found in results review  Recommendations:None  Follow-up plan: ICM clinic phone appointment on8/24/2020.  Copy of ICM check sent to Dr [name].   3 month ICM trend: 09/19/2018    1 Year ICM trend:       [name], RN 09/20/2018 10:59 AM

## 2018-09-21 ENCOUNTER — Ambulatory Visit (HOSPITAL_COMMUNITY): Payer: Self-pay | Admitting: Pharmacist

## 2018-09-21 DIAGNOSIS — Z7901 Long term (current) use of anticoagulants: Secondary | ICD-10-CM

## 2018-09-21 LAB — POCT INR: INR: 1.9 — AB (ref 2.0–3.0)

## 2018-09-26 ENCOUNTER — Telehealth (HOSPITAL_COMMUNITY): Payer: Self-pay | Admitting: Licensed Clinical Social Worker

## 2018-09-26 NOTE — Telephone Encounter (Signed)
CSW shared information about LVAD Support Group meeting virtually today if interested. Message left. Raquel Sarna, [place], [place]

## 2018-09-27 ENCOUNTER — Other Ambulatory Visit (HOSPITAL_COMMUNITY): Payer: Self-pay

## 2018-09-27 DIAGNOSIS — Z7189 Other specified counseling: Secondary | ICD-10-CM

## 2018-09-27 DIAGNOSIS — I110 Hypertensive heart disease with heart failure: Secondary | ICD-10-CM

## 2018-09-27 MED ORDER — LOSARTAN POTASSIUM 25 MG PO TABS: 25.0000 mg | ORAL_TABLET | Freq: Every day | ORAL | 6 refills | Status: AC

## 2018-09-29 ENCOUNTER — Ambulatory Visit (HOSPITAL_COMMUNITY): Payer: Self-pay | Admitting: Pharmacist

## 2018-09-29 ENCOUNTER — Encounter (HOSPITAL_COMMUNITY): Payer: PPO

## 2018-09-29 DIAGNOSIS — Z7901 Long term (current) use of anticoagulants: Secondary | ICD-10-CM

## 2018-09-29 LAB — POCT INR: INR: 2.6 (ref 2.0–3.0)

## 2018-10-05 ENCOUNTER — Other Ambulatory Visit (HOSPITAL_COMMUNITY): Payer: Self-pay | Admitting: *Deleted

## 2018-10-05 ENCOUNTER — Ambulatory Visit (HOSPITAL_COMMUNITY): Payer: Self-pay | Admitting: Pharmacist

## 2018-10-05 DIAGNOSIS — Z95811 Presence of heart assist device: Secondary | ICD-10-CM

## 2018-10-05 DIAGNOSIS — Z7901 Long term (current) use of anticoagulants: Secondary | ICD-10-CM

## 2018-10-05 LAB — POCT INR: INR: 1.7 — AB (ref 2.0–3.0)

## 2018-10-06 ENCOUNTER — Ambulatory Visit (HOSPITAL_COMMUNITY)
Admission: RE | Admit: 2018-10-06 | Discharge: 2018-10-06 | Disposition: A | Discharge status: Home / Self Care | Payer: PPO | Source: Ambulatory Visit | Attending: Internal Medicine | Admitting: Internal Medicine

## 2018-10-06 ENCOUNTER — Encounter (HOSPITAL_COMMUNITY): Payer: Self-pay

## 2018-10-06 ENCOUNTER — Other Ambulatory Visit (HOSPITAL_COMMUNITY): Payer: Self-pay | Admitting: *Deleted

## 2018-10-06 ENCOUNTER — Other Ambulatory Visit: Payer: Self-pay

## 2018-10-06 VITALS — BP 93/65 | HR 79 | Temp 97.4°F | Ht 70.0 in | Wt 223.2 lb

## 2018-10-06 DIAGNOSIS — D649 Anemia, unspecified: Secondary | ICD-10-CM | POA: Diagnosis not present

## 2018-10-06 DIAGNOSIS — Z95811 Presence of heart assist device: Secondary | ICD-10-CM | POA: Diagnosis not present

## 2018-10-06 DIAGNOSIS — I130 Hypertensive heart and chronic kidney disease with heart failure and stage 1 through stage 4 chronic kidney disease, or unspecified chronic kidney disease: Secondary | ICD-10-CM | POA: Diagnosis not present

## 2018-10-06 DIAGNOSIS — E11319 Type 2 diabetes mellitus with unspecified diabetic retinopathy without macular edema: Secondary | ICD-10-CM | POA: Insufficient documentation

## 2018-10-06 DIAGNOSIS — I4891 Unspecified atrial fibrillation: Secondary | ICD-10-CM | POA: Diagnosis not present

## 2018-10-06 DIAGNOSIS — Z955 Presence of coronary angioplasty implant and graft: Secondary | ICD-10-CM | POA: Diagnosis not present

## 2018-10-06 DIAGNOSIS — I499 Cardiac arrhythmia, unspecified: Secondary | ICD-10-CM

## 2018-10-06 DIAGNOSIS — I255 Ischemic cardiomyopathy: Secondary | ICD-10-CM | POA: Diagnosis not present

## 2018-10-06 DIAGNOSIS — Z79899 Other long term (current) drug therapy: Secondary | ICD-10-CM | POA: Diagnosis not present

## 2018-10-06 DIAGNOSIS — Z7901 Long term (current) use of anticoagulants: Secondary | ICD-10-CM

## 2018-10-06 DIAGNOSIS — Z7982 Long term (current) use of aspirin: Secondary | ICD-10-CM | POA: Diagnosis not present

## 2018-10-06 DIAGNOSIS — E7800 Pure hypercholesterolemia, unspecified: Secondary | ICD-10-CM | POA: Diagnosis not present

## 2018-10-06 DIAGNOSIS — I5022 Chronic systolic (congestive) heart failure: Secondary | ICD-10-CM | POA: Diagnosis not present

## 2018-10-06 DIAGNOSIS — N179 Acute kidney failure, unspecified: Secondary | ICD-10-CM | POA: Diagnosis not present

## 2018-10-06 DIAGNOSIS — E1122 Type 2 diabetes mellitus with diabetic chronic kidney disease: Secondary | ICD-10-CM | POA: Insufficient documentation

## 2018-10-06 DIAGNOSIS — Z794 Long term (current) use of insulin: Secondary | ICD-10-CM | POA: Diagnosis not present

## 2018-10-06 DIAGNOSIS — I4892 Unspecified atrial flutter: Secondary | ICD-10-CM | POA: Insufficient documentation

## 2018-10-06 DIAGNOSIS — I5082 Biventricular heart failure: Secondary | ICD-10-CM | POA: Insufficient documentation

## 2018-10-06 DIAGNOSIS — I2510 Atherosclerotic heart disease of native coronary artery without angina pectoris: Secondary | ICD-10-CM | POA: Diagnosis not present

## 2018-10-06 DIAGNOSIS — N183 Chronic kidney disease, stage 3 (moderate): Secondary | ICD-10-CM | POA: Insufficient documentation

## 2018-10-06 DIAGNOSIS — I472 Ventricular tachycardia, unspecified: Secondary | ICD-10-CM

## 2018-10-06 DIAGNOSIS — Z880 Allergy status to penicillin: Secondary | ICD-10-CM | POA: Diagnosis not present

## 2018-10-06 DIAGNOSIS — I10 Essential (primary) hypertension: Secondary | ICD-10-CM

## 2018-10-06 DIAGNOSIS — M79606 Pain in leg, unspecified: Secondary | ICD-10-CM | POA: Diagnosis not present

## 2018-10-06 LAB — BASIC METABOLIC PANEL
Anion gap: 9 (ref 5–15)
BUN: 22 mg/dL (ref 8–23)
CO2: 22 mmol/L (ref 22–32)
Calcium: 9.3 mg/dL (ref 8.9–10.3)
Chloride: 103 mmol/L (ref 98–111)
Creatinine, Ser: 1.57 mg/dL — ABNORMAL HIGH (ref 0.61–1.24)
GFR calc Af Amer: 52 mL/min — ABNORMAL LOW (ref >60)
GFR calc non Af Amer: 45 mL/min — ABNORMAL LOW (ref >60)
Glucose, Bld: 235 mg/dL — ABNORMAL HIGH (ref 70–99)
Potassium: 5.2 mmol/L — ABNORMAL HIGH (ref 3.5–5.1)
Sodium: 134 mmol/L — ABNORMAL LOW (ref 135–145)

## 2018-10-06 LAB — CBC
HCT: 41.9 % (ref 39.0–52.0)
Hemoglobin: 13 g/dL (ref 13.0–17.0)
MCH: 27.5 pg (ref 26.0–34.0)
MCHC: 31 g/dL (ref 30.0–36.0)
MCV: 88.8 fL (ref 80.0–100.0)
Platelets: 170 10*3/uL (ref 150–400)
RBC: 4.72 MIL/uL (ref 4.22–5.81)
RDW: 16.3 % — ABNORMAL HIGH (ref 11.5–15.5)
WBC: 5.4 10*3/uL (ref 4.0–10.5)
nRBC: 0 % (ref 0.0–0.2)

## 2018-10-06 LAB — PREALBUMIN: Prealbumin: 23.9 mg/dL (ref 18–38)

## 2018-10-06 LAB — MAGNESIUM: Magnesium: 2.4 mg/dL (ref 1.7–2.4)

## 2018-10-06 LAB — LACTATE DEHYDROGENASE: LDH: 194 U/L — ABNORMAL HIGH (ref 98–192)

## 2018-10-06 MED ORDER — WARFARIN SODIUM 2.5 MG PO TABS: ORAL_TABLET | ORAL | 3 refills | Status: AC

## 2018-10-06 MED ORDER — GABAPENTIN 300 MG PO CAPS: 300.0000 mg | ORAL_CAPSULE | Freq: Two times a day (BID) | ORAL | 3 refills | Status: DC

## 2018-10-06 MED ORDER — AMIODARONE HCL 100 MG PO TABS: 100.0000 mg | ORAL_TABLET | Freq: Every day | ORAL | 3 refills | Status: DC

## 2018-10-06 NOTE — Patient Instructions (Signed)
1. Decrease Amiodarone to 100 mg daily (new Rx sent). 2. Increase fluid or salt intake. 3. Return to clinic in 2 months.

## 2018-10-06 NOTE — Progress Notes (Signed)
Patient presents for 2 month f/u in [facility] today with wife. Denies any issues with VAD equipment. Reports several "alarms" over past few days.  Pt says he feels good, is gaining strength and stamina. Reports he is walking "a little" every day. Does admit to getting "too hot" on several days while walking.  Low Flow alarms noted on controller hx - all occurring early am or night. Pt denies any dizziness or syncope. Says he is drinking 48 - 64 oz/day. Encouraged to increase fluid intake. Dr. [name] instructed him to increase fluids or salt. Pt verbalized understanding of same.   Pt currently taking Amiodarone 200 mg in am and 100 mg in pm. Per Dr. Bernadene Bell, pt instructed to decrease to 100 mg daily. New Rx sent; AVS sent with patient.   Vital Signs: Temp: 97.4 Doppler Pressure: 70 Automatc BP: 93/65 (73) HR: 79 SPO2: UTO  Weight: 223.2 lb w/o eqt  Last weight: 223.4 lb w/o eqip   VAD Indication: Destination Therapy- Implanted 08/16/17   LVAD assessment: HM III: VAD Speed: 5100 Flow: 3.9 Power: 3.6 w    PI: 6.1 Alarms:  Low Flow on 7/27, 8/1, 8/2, 8/3, and 8/4 - all occurring at night or early am.  Events:  > 40 today; > 70 yesterday Hct: 41  Fixed speed: 5300 Low speed limit: 5000  Primary: Back up battery expiring in 19 months. Secondary: Back up battery expiring in 19 months    I reviewed the LVAD parameters from today and compared the results to the patient's prior recorded data. LVAD interrogation was NEGATIVE for significant power changes, NEGATIVE for clinical alarms and POSITIVE for PI events/speed drops. No other changes were made and pump is functioning within specified parameters. Pt is performing daily controller and system monitor self tests along with completing weekly and monthly maintenance for LVAD equipment.  LVAD equipment check completed and is in good working order. Back-up equipment present.   Annual Equipment Maintenance on UBC/PM was  performed on 08/03/2018.   Exit Site Care: VAD dressing site care is being performed weekly by wife, [name]. [place] dressing C/D/I with anchor attached and accurately applied. Supplied pt with 10 weekly dressing kits and 5 anchors. Pt ask about showering today. Shower kit provided with following instructions:  . Avoid getting the driveline exit site dressing wet, and consider planning bathing times around exit site dressing changes. . Use only the shower bag we gave you to shower with and don't get creative with your equipment to shower. Water and electricity do not mix and will cause your pump to stop.   . Sit on a chair or bathing stool in the bathtub or shower stall, and use a basin of warm water and washcloth or sponge to wash. Or, wash at the sink while standing on a towel, so as not to get the floor or bath rug wet. . To wash your hair, try using a hand-held shower wand or sprayer while standing over the kitchen sink or bathtub. . Be sure that all floor surfaces are dry when walking around after bathing, to avoid slipping. . Do not use powder around the exit site dressing. . Anytime your dressing appears wet CHANGE IT!  Pt and wife verbalized understanding of above.    Device: Medtronic 3 Lead Therapies: ON:  VF 200 bpm, FVT 200 - 240; VT 158 - 200 - ATP Therapies: AT/AF monitor on 171 bpm Pacing: DDD 60 Last check: today 03/28/18 per Tomi Bamberger (Medtronic rep)  Labs:  Doppler 70 - reflective of MAP  Hgb 13.0 - No S/S of bleeding. Specifically denies melena/BRBPR or nosebleeds.  LDH stable at 194 and is within established baseline ranging from 170-250.  Denies tea-colored urine. No power elevations noted on interrogation.   Plan: 1. Decrease Amiodarone to 100 mg daily (new Rx sent). 2. Increase fluid or salt intake. 3. May start showering; remember to keep dressing dry - change if it becomes wet.  4. Return to clinic in 2 months.   Zada Girt RN [place] Coordinator   Office: [phone]  24/7 Pager: [phone]

## 2018-10-09 NOTE — Progress Notes (Signed)
VAD Clinic Note   HF: Dr. [name]   HPI: [name] [name] is a 67 y.o. male with h/o chronic systolic CHF due to ICM, s/p LVAD HM3 08/16/2017, [name], [name], [name], [name], [name], HTN, HL, and CKD III.  Admitted 08/11/17 with recurrent A/C systolic CHF. Milrinone added, but mixed venous saturation remained low. Seen by CT surgery/VAD team and deemed appropriate for HMIII under DT criteria. He underwent HMIII on 6/17. Chest was unable to be closed due to high intrathoracic pressure and RV failure.  He returned to the OR on 6/18 for evacuation of hematoma. Chest was later closed on 6/20. Post operative course complicated by  open chest, RV failure, recurrent VT, respiratory failure, and dysphagia. CCM consulted for vent management with extubation completed on 08/26/17.  As he improved all drips gradually weaned off. Prior to d/c  ramp echo was completed with speed optimized.  Spent 7/11 - 7/27 in CIR. Underwent hydrotherapy for his buttock wound(s). 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   Seen on 12/10/17 for VT at 180s. ATP attempted x 4, and then shocked with return to NSR. Started amio 200 bid. Saw EP in f/u and said to continue amio 200 bid for 3-4 weeks then got to 200 daily.   Ramp echo 2/20: - LVEF 20-25%. Good cannula position. LV has shrunk in size. RV moderately down. AoV opening on every beat at 5300. Septum shifted to left. Speed decreased to 5100. (5000 looked good as well). RVOT VTI 22  RHC 05/05/18  VAD speed 5100  RA = 5 RV = 44/7 PA = 43/10 (22) PCW = 13 Fick cardiac output/index =4.9/2.3 PVR = 1.9 WU FA sat = 96% PA sat = 68%, 71%   He presents today for f/u visit with his wife. He says he feels like he continues to get stronger. Walking some every day. Denies SOB. Had a few low flows on VAD. No dizziness. Not taking any  diuretic. Drinking 48-64 ounces per day. Denies edema, orthopnea or PND. No fevers, chills or problems with driveline. No bleeding, melena or neuro symptoms. No VAD alarms aside from low flows. Taking all meds as prescribed.    VAD Indication: Destination Therapy- Implanted 08/16/17   LVAD assessment: HM III: VAD Speed: 5100 Flow: 3.9 Power: 3.6 w PI: 6.1 Alarms:  Low Flow on 7/27, 8/1, 8/2, 8/3, and 8/4 - all occurring at night or early am.  Events:  > 40 today; > 70 yesterday Hct: 41  Fixed speed: 5300 Low speed limit: 5000  Primary: Back up battery expiring in 19 months. Secondary: Back up battery expiring in 19 months   I reviewed the LVAD parameters from todayand compared the results to the patient's prior recorded data.LVAD interrogation was NEGATIVEfor significant power changes, NEGATIVEfor clinicalalarms and POSITIVE for PI events/speed drops. No other changes were madeand pump is functioning within specified parameters. Pt is performing daily controller and system monitor self tests along with completing weekly and monthly maintenance for LVAD equipment.    Past Medical History:  Diagnosis Date  . AICD (automatic cardioverter/defibrillator) present 02/05/2014   Upgrade to Medtronic biventricular ICD, serial number  [id number]   . Atrial flutter ([place]) 04/2012   s/p TEE-EPS+RFCA 04/2012  . CAD (coronary artery disease) 3428,7681 X 2    RCA-T, 70% PL (off CFX), 99% Prox LAD/90% Dist  LAD, S/P TAXUS stent x 2  . CHF (congestive heart failure) ([place])   . Chronic anticoagulation   . Chronic systolic heart failure ([place])   . CKD (chronic kidney disease)   . Diabetic retinopathy ([place])   . DM type 2 (diabetes mellitus, type 2) ([facility])    insulin dependent  . HTN (hypertension)   . Hypercholesteremia    ablation  . ICD (implantable cardiac defibrillator) in place   . Ischemic cardiomyopathy March 2015   20-25% 2D   . Nephrolithiasis   . Ventricular tachycardia  ([place])     Current Outpatient Medications  Medication Sig Dispense Refill  . acetaminophen (TYLENOL) 500 MG tablet Take 1,000 mg by mouth every 6 (six) hours as needed for moderate pain or headache.     Marland Kitchen ascorbic acid (VITAMIN C) 500 MG tablet Take 1 tablet (500 mg total) by mouth 2 (two) times daily. 100 tablet 0  . aspirin 81 MG chewable tablet Chew 1 tablet (81 mg total) by mouth daily.    Marland Kitchen gabapentin (NEURONTIN) 300 MG capsule Take 1 capsule (300 mg total) by mouth 2 (two) times daily. 60 capsule 3  . insulin NPH-regular Human (70-30) 100 UNIT/ML injection Inject 30 Units into the skin daily.    . Insulin Pen Needle (COMFORT EZ PEN NEEDLES) 32G X 6 MM MISC 1 application by Does not apply route at bedtime. 100 each 0  . losartan (COZAAR) 25 MG tablet Take 1 tablet (25 mg total) by mouth daily. 30 tablet 6  . magnesium oxide (MAG-OX) 400 (241.3 Mg) MG tablet Take 1 tablet (400 mg total) by mouth 2 (two) times daily. 60 tablet 0  . Multiple Vitamin (MULTIVITAMIN WITH MINERALS) TABS tablet Take 1 tablet by mouth daily.    Marland Kitchen amiodarone (PACERONE) 100 MG tablet Take 1 tablet (100 mg total) by mouth daily. 30 tablet 3  . warfarin (COUMADIN) 2.5 MG tablet Take on tablet daily or as directed 120 tablet 3   No current facility-administered medications for this encounter.    Allergies  Allergen Reactions  . Penicillins Hives, Itching and Other (See Comments)    Did it involve swelling of the face/tongue/throat, SOB, or low BP? No Did it involve sudden or severe rash/hives, skin peeling, or any reaction on the inside of your mouth or nose? No Did you need to seek medical attention at a hospital or doctor's office? Unknown When did it last happen?childhood allergy If all above answers are "NO", may proceed with cephalosporin use.    Marland Kitchen Alcohol-Sulfur [Sulfur] Other (See Comments)    Burns skin   Review of systems complete and found to be negative unless listed in HPI.    Wt Readings  from Last 3 Encounters:  10/06/18 101.2 kg (223 lb 3.2 oz)  08/04/18 94.8 kg (209 lb)  08/03/18 101.3 kg (223 lb 6.4 oz)    Vitals:   10/06/18 1202 10/06/18 1203  BP: (!) 70/0 93/65  Pulse:  79  Temp:  (!) 97.4 F (36.3 C)  Weight:  101.2 kg (223 lb 3.2 oz)  Height:  5\' 10"  (1.778 m)   Vital Signs: Temp: 97.4 Doppler Pressure:70 Automatc BP: 93/65 (73) HR: 79 SPO2: UTO  Weight: 223.2 lb w/o eqt  Last weight: 223.4 lb w/o eqip   Physical Exam: General:  NAD.  HEENT: normal  Neck: supple. JVP not elevated.  Carotids 2+ bilat; no bruits. No lymphadenopathy or thryomegaly appreciated. Cor: LVAD hum.  Lungs: Clear. Abdomen: obese  soft, nontender, non-distended. No hepatosplenomegaly. No bruits or masses. Good bowel sounds. Driveline site clean. Anchor in place.  Extremities: no cyanosis, clubbing, rash. Warm no edema  Neuro: alert & oriented x 3. No focal deficits. Moves all 4 without problem    ASSESSMENT AND PLAN:  1. Chronic systolic CHF with biventricular failure-> cardiogenic shock: - Echo 08/13/2017 EF 20-25%.s/p Medtronic BiV ICD in place. - Cath 12/18 with stable 1v CAD.  - s/p HM-3 implant 08/16/17.  - Doing well NYHA II 14 months post VAD  2. VAD: s/p HM-3 implant 08/16/17 - VAD interrogated personally.  - PI events much less frequent but having some low flows. Off diuretics. Encouraged po intake. - Ramp echo 2/20: LVEF 20-25%. Good cannula position. LV has shrunk in size. RV moderately down. AoV opening on every beat at 5300. Septum shifted to left. Speed decreased to 5100. (5000 looked good as well). RVOT VTI 22 - RHC 3/20. Well compensated. No significant RV failure. 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 No change,.   3. Acute on CKD III: - Creatinine back to baseline `1.57 4. [name]/VF:  - ICD shock 12/09/17 with VT in 180s.  - Quiescent  -  Decrease amio to 100 daily 5. AFL/atrial fibrillation:  - S/p previous ablation. He is currently in NSR -Continue amio for VT.On coumadin as above.  - No change to current plan.   6. CAD s/p PCI of RCA and GYK:ZLDJTT cath with stable CAD as above:  - No s/s of ischemia.    - Continue ASA and statin. He has completed CR 7. [name]: . - Per PCP.   8. Anemia:  - Hgb 13.0 MCV 88 9. HTN -Blood pressure well controlled. Continue current regimen. 10. Leg pain - ? Neuropathic vs vascular. Has f.u with Dr. Posey Pronto in Neuro - Improved recently  Total time spent 35 minutes. Over half that time spent discussing above.   Glori Bickers, MD  10/09/18

## 2018-10-12 ENCOUNTER — Other Ambulatory Visit (HOSPITAL_COMMUNITY): Payer: Self-pay | Admitting: Cardiology

## 2018-10-12 DIAGNOSIS — Z7901 Long term (current) use of anticoagulants: Secondary | ICD-10-CM | POA: Diagnosis not present

## 2018-10-12 DIAGNOSIS — T827XXA Infection and inflammatory reaction due to other cardiac and vascular devices, implants and grafts, initial encounter: Secondary | ICD-10-CM

## 2018-10-12 DIAGNOSIS — Z95811 Presence of heart assist device: Secondary | ICD-10-CM

## 2018-10-12 LAB — POCT INR: INR: 1.8 — AB (ref 2.0–3.0)

## 2018-10-13 ENCOUNTER — Ambulatory Visit (HOSPITAL_COMMUNITY): Payer: Self-pay | Admitting: Pharmacist

## 2018-10-13 DIAGNOSIS — Z7901 Long term (current) use of anticoagulants: Secondary | ICD-10-CM

## 2018-10-19 LAB — POCT INR: INR: 2 (ref 2.0–3.0)

## 2018-10-20 ENCOUNTER — Ambulatory Visit (HOSPITAL_COMMUNITY): Payer: Self-pay | Admitting: Pharmacist

## 2018-10-20 DIAGNOSIS — Z7901 Long term (current) use of anticoagulants: Secondary | ICD-10-CM

## 2018-10-20 NOTE — Progress Notes (Signed)
LVAD INR 

## 2018-10-24 ENCOUNTER — Ambulatory Visit (INDEPENDENT_AMBULATORY_CARE_PROVIDER_SITE_OTHER): Payer: PPO

## 2018-10-24 DIAGNOSIS — I5022 Chronic systolic (congestive) heart failure: Secondary | ICD-10-CM

## 2018-10-24 DIAGNOSIS — Z95810 Presence of automatic (implantable) cardiac defibrillator: Secondary | ICD-10-CM

## 2018-10-25 ENCOUNTER — Telehealth: Payer: Self-pay

## 2018-10-25 NOTE — Telephone Encounter (Signed)
Remote ICM transmission received.  Attempted call to patient regarding ICM remote transmission and no answer.  

## 2018-10-25 NOTE — Progress Notes (Signed)
EPIC Encounter for ICM Monitoring  Patient Name: [name] [name] is a 68 y.o. male Date: 10/25/2018 Primary Care Physican: [name], NP Primary Cardiologist:[name] Electrophysiologist: [name] BiV Pacing: 96.2% LastWeight:209-212lbs  Battery ERI: Estimated at25months  Attempted call to patient and unable to reach.   Transmission reviewed.   OptivolThoracic impedancesuggesting possible fluid accumulation since 10/17/2018.  Prescribed:no longer prescribed diuretic.  Labs: 10/06/2018 Creatinine 1.57, BUN 22, Potassium 5.2, Sodium 134, GFR 45-52 08/03/2018 Creatinine 2.15, BUN 39, Potassium 5.0, Sodium 140, GFR 31-35 A complete set of results can be found in results review  Recommendations:Unable to reach.    Follow-up plan: ICM clinic phone appointment on9/12/2018 to recheck fluid levels.91 day device clinic remote transmission 11/10/2018.  Copy of ICM check sent to Dr [name] and Dr [name].    3 month ICM trend: 10/25/2018    1 Year ICM trend:       [name], RN 10/25/2018 11:16 AM

## 2018-10-28 ENCOUNTER — Ambulatory Visit (HOSPITAL_COMMUNITY): Payer: Self-pay | Admitting: Pharmacist

## 2018-10-28 DIAGNOSIS — Z7901 Long term (current) use of anticoagulants: Secondary | ICD-10-CM

## 2018-10-28 LAB — POCT INR: INR: 3 (ref 2.0–3.0)

## 2018-10-31 ENCOUNTER — Telehealth (HOSPITAL_COMMUNITY): Payer: Self-pay | Admitting: Licensed Clinical Social Worker

## 2018-10-31 NOTE — Telephone Encounter (Signed)
Message left to remind patient of virtual VAD support group today and to review needs for Covid 19 public health concerns and emergency VAD pager number. Jackie Ashleynicole Mcclees, LCSW, CCSW-MCS [phone] 

## 2018-11-03 ENCOUNTER — Ambulatory Visit (HOSPITAL_COMMUNITY): Payer: Self-pay | Admitting: Pharmacist

## 2018-11-03 DIAGNOSIS — Z7901 Long term (current) use of anticoagulants: Secondary | ICD-10-CM

## 2018-11-03 LAB — POCT INR: INR: 1.9 — AB (ref 2.0–3.0)

## 2018-11-03 NOTE — Progress Notes (Signed)
LVAD INR 

## 2018-11-10 ENCOUNTER — Ambulatory Visit (INDEPENDENT_AMBULATORY_CARE_PROVIDER_SITE_OTHER): Payer: PPO | Admitting: *Deleted

## 2018-11-10 DIAGNOSIS — I5022 Chronic systolic (congestive) heart failure: Secondary | ICD-10-CM

## 2018-11-10 DIAGNOSIS — I472 Ventricular tachycardia: Secondary | ICD-10-CM

## 2018-11-10 DIAGNOSIS — I4729 Other ventricular tachycardia: Secondary | ICD-10-CM

## 2018-11-11 ENCOUNTER — Ambulatory Visit (INDEPENDENT_AMBULATORY_CARE_PROVIDER_SITE_OTHER): Payer: PPO

## 2018-11-11 ENCOUNTER — Ambulatory Visit (HOSPITAL_COMMUNITY): Payer: Self-pay | Admitting: Pharmacist

## 2018-11-11 DIAGNOSIS — I5022 Chronic systolic (congestive) heart failure: Secondary | ICD-10-CM | POA: Diagnosis not present

## 2018-11-11 DIAGNOSIS — Z95810 Presence of automatic (implantable) cardiac defibrillator: Secondary | ICD-10-CM

## 2018-11-11 DIAGNOSIS — Z7901 Long term (current) use of anticoagulants: Secondary | ICD-10-CM | POA: Diagnosis not present

## 2018-11-11 LAB — CUP PACEART REMOTE DEVICE CHECK
Battery Remaining Longevity: 14 mo
Battery Voltage: 2.9 V
Brady Statistic AP VP Percent: 85.03 %
Brady Statistic AP VS Percent: 2.07 %
Brady Statistic AS VP Percent: 12.38 %
Brady Statistic AS VS Percent: 0.53 %
Brady Statistic RA Percent Paced: 86.98 %
Brady Statistic RV Percent Paced: 97.27 %
Date Time Interrogation Session: 20200910234226
HighPow Impedance: 40 Ohm
HighPow Impedance: 54 Ohm
Implantable Lead Implant Date: 20071219
Implantable Lead Implant Date: 20151207
Implantable Lead Implant Date: 20151207
Implantable Lead Location: 753858
Implantable Lead Location: 753859
Implantable Lead Location: 753860
Implantable Lead Model: 4598
Implantable Lead Model: 5076
Implantable Lead Model: 6947
Implantable Pulse Generator Implant Date: 20151207
Lead Channel Impedance Value: 304 Ohm
Lead Channel Impedance Value: 342 Ohm
Lead Channel Impedance Value: 380 Ohm
Lead Channel Impedance Value: 380 Ohm
Lead Channel Impedance Value: 399 Ohm
Lead Channel Impedance Value: 456 Ohm
Lead Channel Impedance Value: 513 Ohm
Lead Channel Impedance Value: 570 Ohm
Lead Channel Impedance Value: 627 Ohm
Lead Channel Impedance Value: 646 Ohm
Lead Channel Impedance Value: 836 Ohm
Lead Channel Impedance Value: 855 Ohm
Lead Channel Impedance Value: 893 Ohm
Lead Channel Pacing Threshold Amplitude: 0.75 V
Lead Channel Pacing Threshold Amplitude: 1 V
Lead Channel Pacing Threshold Amplitude: 1 V
Lead Channel Pacing Threshold Pulse Width: 0.4 ms
Lead Channel Pacing Threshold Pulse Width: 0.4 ms
Lead Channel Pacing Threshold Pulse Width: 0.4 ms
Lead Channel Sensing Intrinsic Amplitude: 1.875 mV
Lead Channel Sensing Intrinsic Amplitude: 1.875 mV
Lead Channel Sensing Intrinsic Amplitude: 9.125 mV
Lead Channel Sensing Intrinsic Amplitude: 9.125 mV
Lead Channel Setting Pacing Amplitude: 0.5 V
Lead Channel Setting Pacing Amplitude: 1.5 V
Lead Channel Setting Pacing Amplitude: 2 V
Lead Channel Setting Pacing Pulse Width: 0.03 ms
Lead Channel Setting Pacing Pulse Width: 0.4 ms
Lead Channel Setting Sensing Sensitivity: 0.3 mV

## 2018-11-11 LAB — POCT INR: INR: 2.1 (ref 2.0–3.0)

## 2018-11-11 NOTE — Progress Notes (Signed)
LVAD INR 

## 2018-11-11 NOTE — Progress Notes (Signed)
EPIC Encounter for ICM Monitoring  Patient Name: GUSTABO BECH is a 68 y.o. male Date: 11/11/2018 Primary Care Physican: [name], NP Primary Cardiologist:[name] Electrophysiologist: [name] BiV Pacing: 97.3% LastWeight:209-212lbs  Battery ERI: Estimated at29months  Spoke with patient.  He reports he is feeling well.  He denies any fluid symptoms at this time.  OptivolThoracic impedancesuggesting possible fluid accumulation since 10/17/2018.  Trending just below baseline.   Prescribed:no longer prescribed diuretic.  Labs: 10/06/2018 Creatinine 1.57, BUN 22, Potassium 5.2, Sodium 134, GFR 45-52 08/03/2018 Creatinine 2.15, BUN 39, Potassium 5.0, Sodium 140, GFR 31-35 A complete set of results can be found in results review  Recommendations: No changes and encouraged to call if experiencing any fluid symptoms.  Follow-up plan: ICM clinic phone appointment on 12/26/2018.   91 day device clinic remote transmission 02/09/2019.  Office appt 12/01/2018 with VAD clinic.    Copy of ICM check sent to Dr. [name].   3 month ICM trend: 11/10/2018    1 Year ICM trend:       [name], RN 11/11/2018 1:07 PM

## 2018-11-17 ENCOUNTER — Ambulatory Visit (HOSPITAL_COMMUNITY): Payer: Self-pay | Admitting: Pharmacist

## 2018-11-17 DIAGNOSIS — Z7901 Long term (current) use of anticoagulants: Secondary | ICD-10-CM

## 2018-11-17 LAB — POCT INR: INR: 1.4 — AB (ref 2.0–3.0)

## 2018-11-17 NOTE — Progress Notes (Signed)
LVAD INR 

## 2018-11-18 NOTE — Progress Notes (Signed)
Remote ICD transmission.   

## 2018-11-21 ENCOUNTER — Telehealth (HOSPITAL_COMMUNITY): Payer: Self-pay | Admitting: Licensed Clinical Social Worker

## 2018-11-21 LAB — POCT INR: INR: 2.6 (ref 2.0–3.0)

## 2018-11-21 NOTE — Telephone Encounter (Signed)
Message left to remind patient of virtual VAD support group today and to review needs for Covid 19 public health concerns and emergency VAD pager number. Jackie Samaj Wessells, LCSW, CCSW-MCS [phone] 

## 2018-11-22 ENCOUNTER — Ambulatory Visit (HOSPITAL_COMMUNITY): Payer: Self-pay | Admitting: Pharmacist

## 2018-11-22 DIAGNOSIS — Z7901 Long term (current) use of anticoagulants: Secondary | ICD-10-CM

## 2018-11-22 NOTE — Progress Notes (Signed)
LVAD INR 

## 2018-11-25 IMAGING — RF DG SWALLOWING FUNCTION - NRPT [facility]
12 of 16 series · 13 of 24 positions shown · non-contrast
Comparison: none

[Series 1: run · 1 of 36 frames shown (1 of 12)]
[frame 4/36]
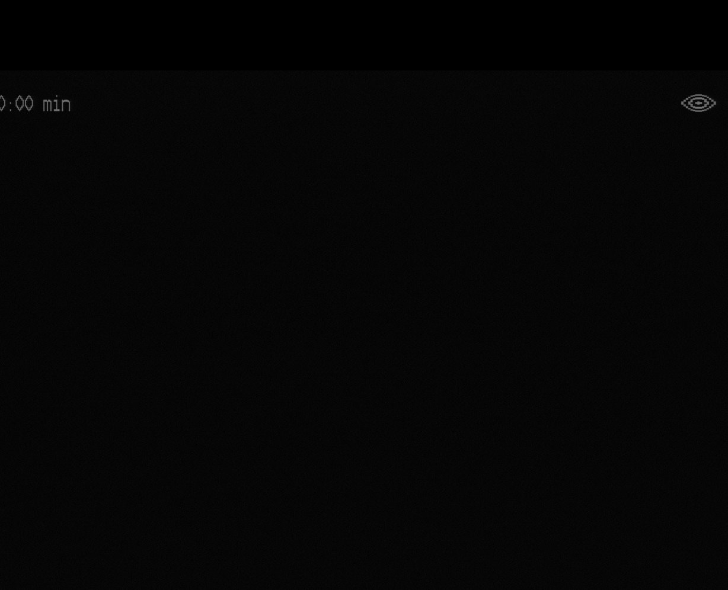

[Series 2: run · 1 of 27 frames shown (2 of 12)]
[frame 5/27]
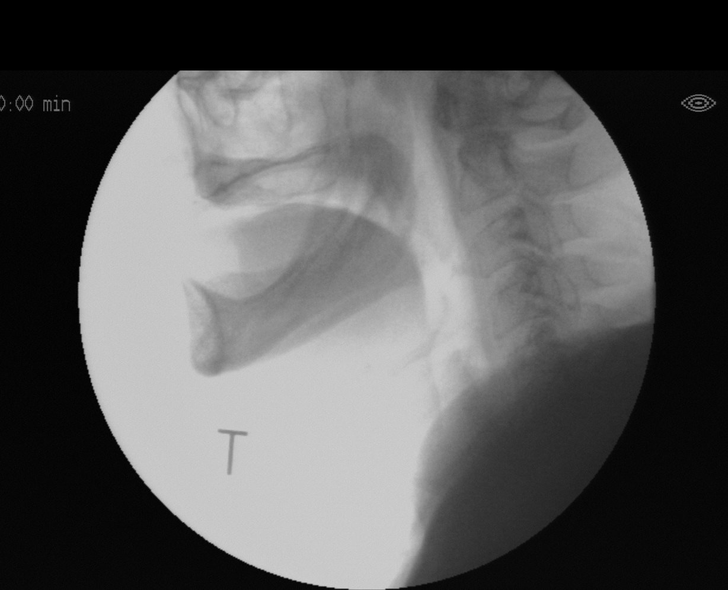

[Series 3: run · 1 of 53 frames shown (3 of 12)]
[frame 46/53]
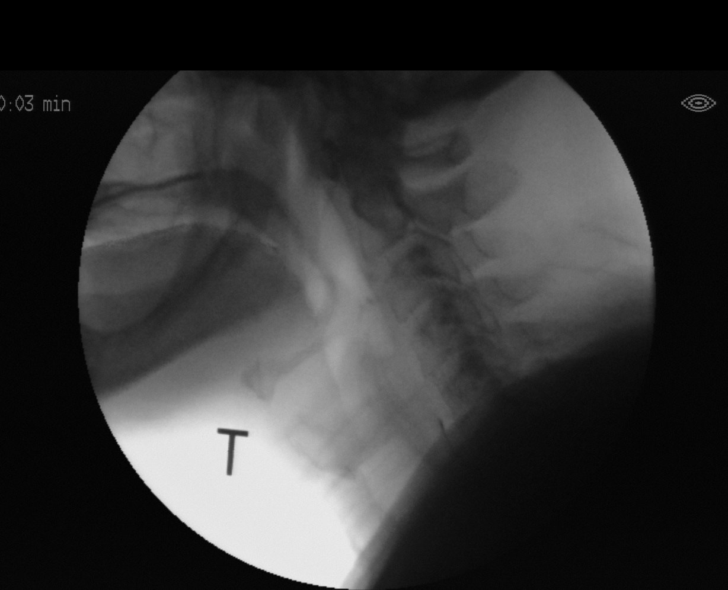

[Series 5: run · 1 of 28 frames shown (4 of 12)]
[frame 5/28]
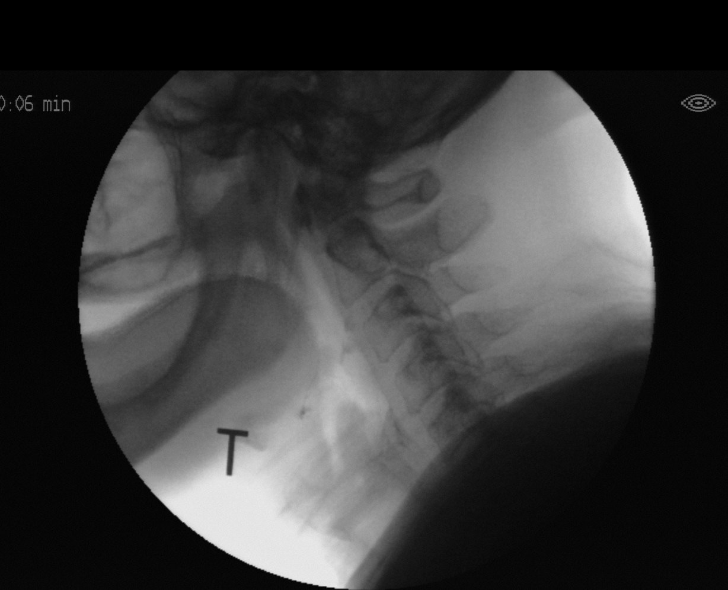

[Series 6: run · 1 of 123 frames shown (5 of 12)]
[frame 62/123]
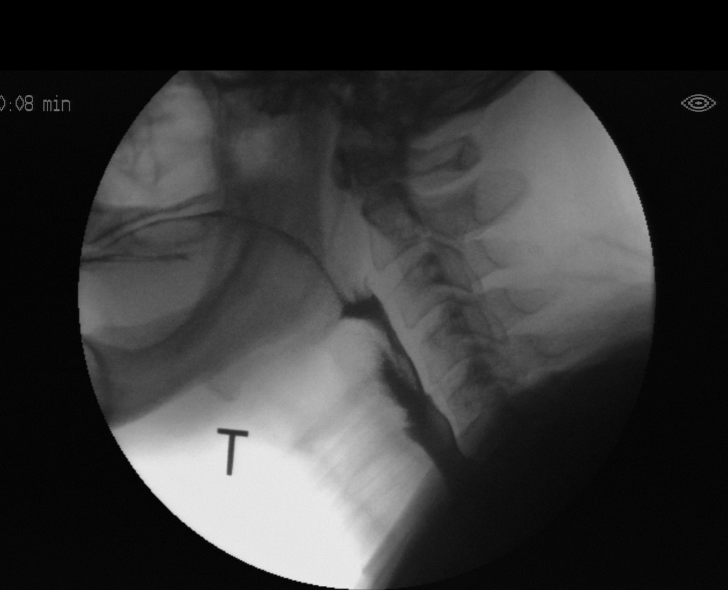

[Series 7: run · 1 of 344 frames shown (6 of 12)]
[frame 293/344]
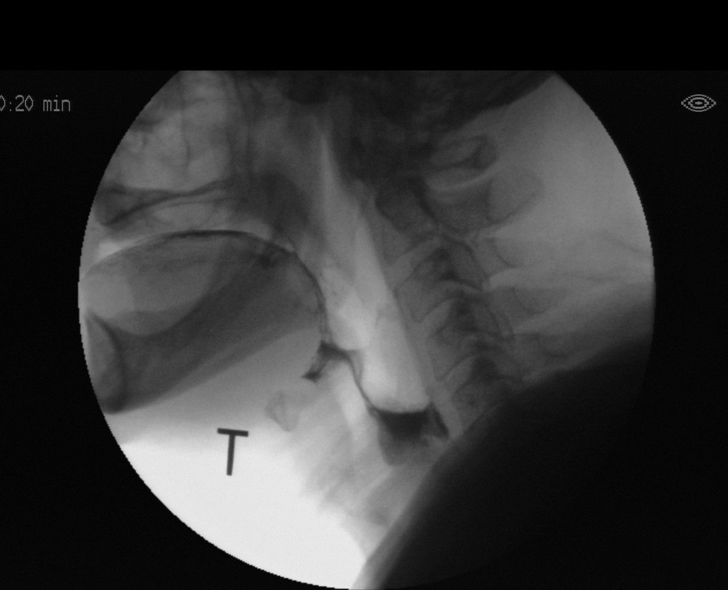

[Series 9: run · 2 of 121 frames shown (7 of 12)]
[frame 19/121]
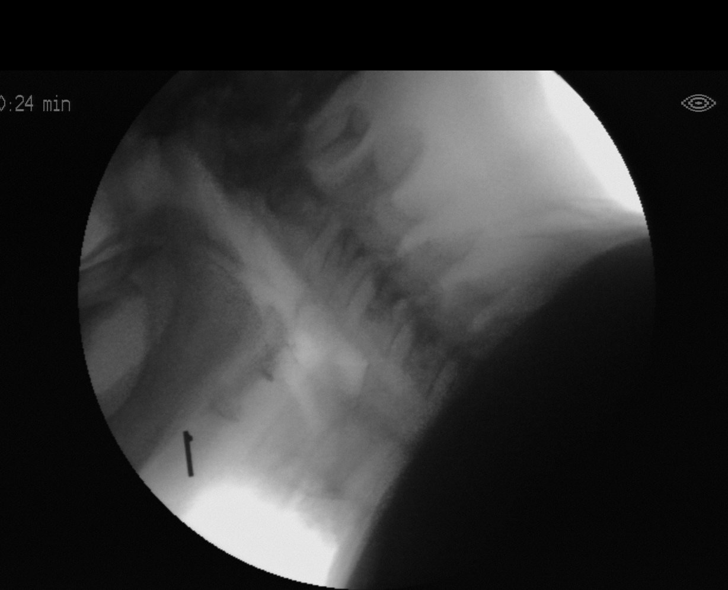
[frame 103/121]
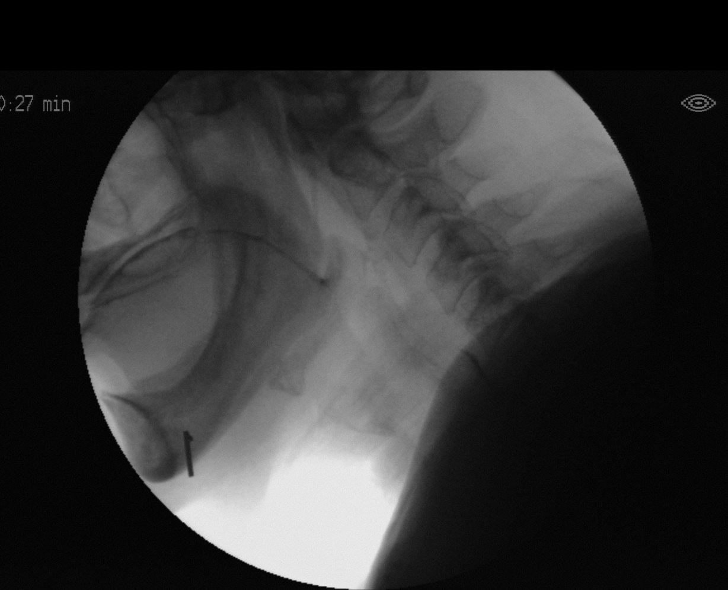

[Series 11: run · 1 of 106 frames shown (8 of 12)]
[frame 1/106]
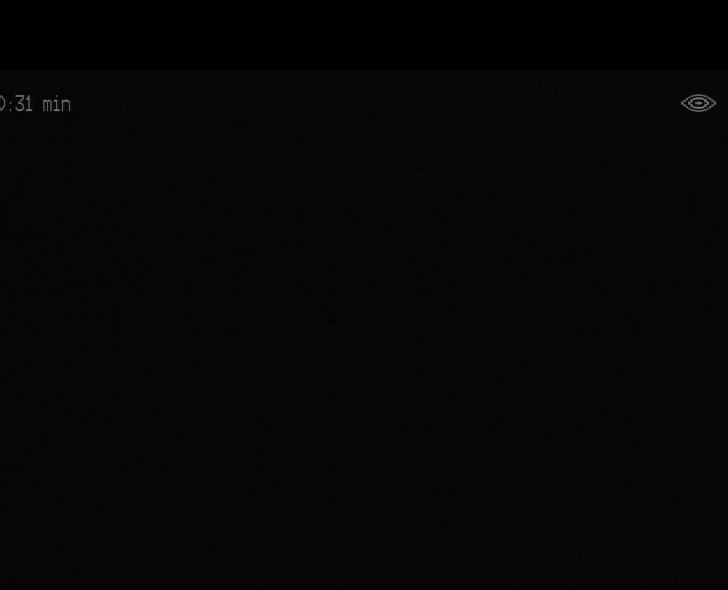

[Series 12: run · 1 of 53 frames shown (9 of 12)]
[frame 37/53]
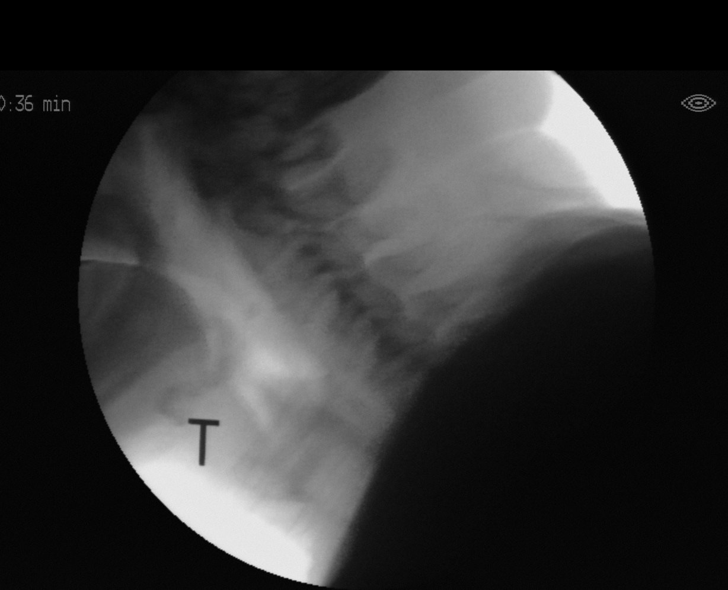

[Series 13: run · 1 of 440 frames shown (10 of 12)]
[frame 384/440]
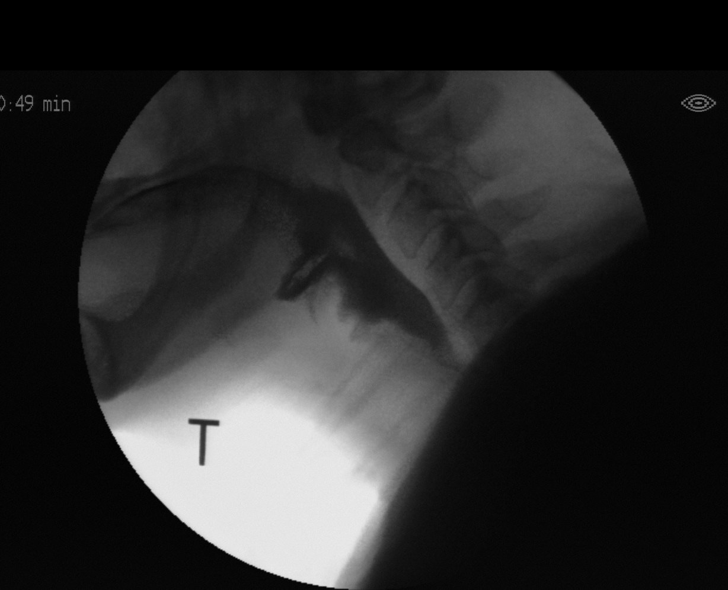

[Series 15: run · 1 of 145 frames shown (11 of 12)]
[frame 22/145]
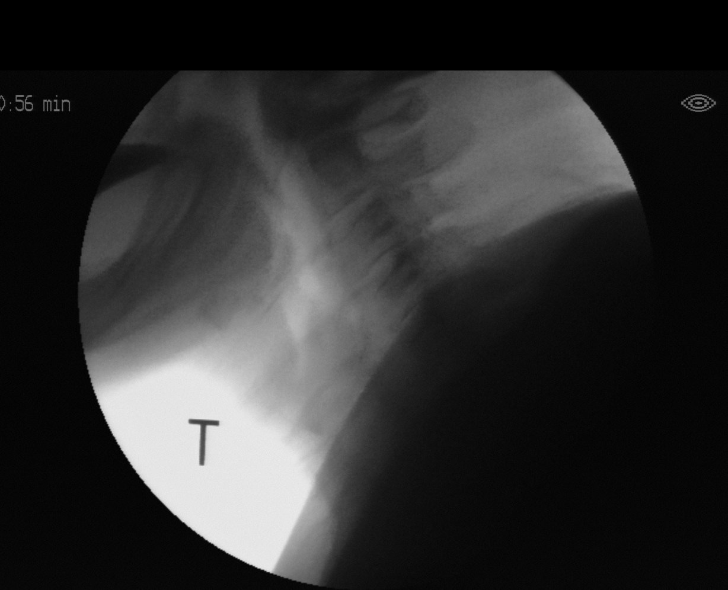

[Series 16: run · 1 of 441 frames shown (12 of 12)]
[frame 375/441]
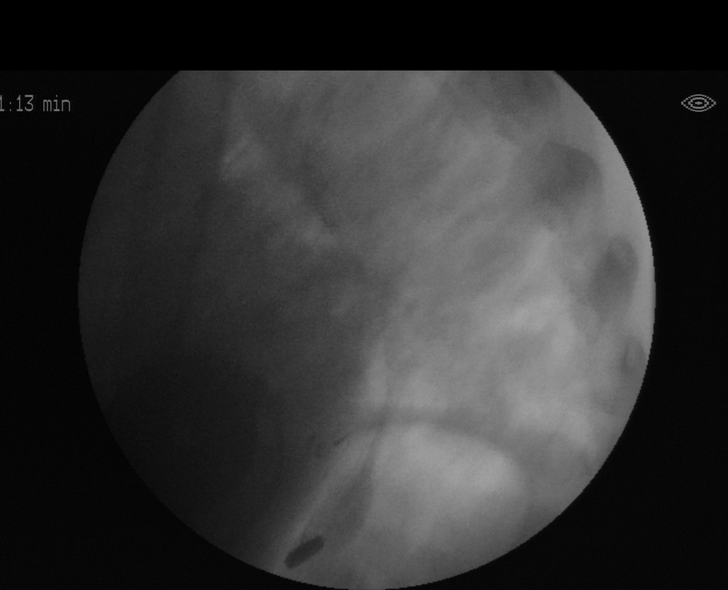

[13 of 24 positions shown; findings below may reference images not displayed]

FLUOROSCOPY FOR SWALLOWING FUNCTION STUDY:
Fluoroscopy was provided for swallowing function study, which was administered by a speech pathologist.  Final results and recommendations from this study are contained within the speech pathology report.

## 2018-11-30 ENCOUNTER — Other Ambulatory Visit (HOSPITAL_COMMUNITY): Payer: Self-pay | Admitting: *Deleted

## 2018-11-30 DIAGNOSIS — Z95811 Presence of heart assist device: Secondary | ICD-10-CM

## 2018-11-30 DIAGNOSIS — Z7901 Long term (current) use of anticoagulants: Secondary | ICD-10-CM

## 2018-12-01 ENCOUNTER — Ambulatory Visit (HOSPITAL_COMMUNITY)
Admission: RE | Admit: 2018-12-01 | Discharge: 2018-12-01 | Disposition: A | Discharge status: Home / Self Care | Payer: PPO | Source: Ambulatory Visit | Attending: Internal Medicine | Admitting: Internal Medicine

## 2018-12-01 ENCOUNTER — Ambulatory Visit (HOSPITAL_COMMUNITY): Payer: Self-pay | Admitting: Pharmacist

## 2018-12-01 ENCOUNTER — Encounter (HOSPITAL_COMMUNITY): Payer: Self-pay

## 2018-12-01 ENCOUNTER — Other Ambulatory Visit: Payer: Self-pay

## 2018-12-01 VITALS — BP 133/67 | HR 62 | Temp 98.2°F | Ht 70.0 in | Wt 238.2 lb

## 2018-12-01 DIAGNOSIS — Z7901 Long term (current) use of anticoagulants: Secondary | ICD-10-CM

## 2018-12-01 DIAGNOSIS — Z95810 Presence of automatic (implantable) cardiac defibrillator: Secondary | ICD-10-CM | POA: Diagnosis not present

## 2018-12-01 DIAGNOSIS — I2510 Atherosclerotic heart disease of native coronary artery without angina pectoris: Secondary | ICD-10-CM | POA: Insufficient documentation

## 2018-12-01 DIAGNOSIS — Z79899 Other long term (current) drug therapy: Secondary | ICD-10-CM | POA: Diagnosis not present

## 2018-12-01 DIAGNOSIS — I4892 Unspecified atrial flutter: Secondary | ICD-10-CM | POA: Insufficient documentation

## 2018-12-01 DIAGNOSIS — I5022 Chronic systolic (congestive) heart failure: Secondary | ICD-10-CM | POA: Diagnosis not present

## 2018-12-01 DIAGNOSIS — I130 Hypertensive heart and chronic kidney disease with heart failure and stage 1 through stage 4 chronic kidney disease, or unspecified chronic kidney disease: Secondary | ICD-10-CM | POA: Diagnosis not present

## 2018-12-01 DIAGNOSIS — D649 Anemia, unspecified: Secondary | ICD-10-CM | POA: Insufficient documentation

## 2018-12-01 DIAGNOSIS — I4891 Unspecified atrial fibrillation: Secondary | ICD-10-CM | POA: Diagnosis not present

## 2018-12-01 DIAGNOSIS — Z794 Long term (current) use of insulin: Secondary | ICD-10-CM | POA: Diagnosis not present

## 2018-12-01 DIAGNOSIS — E1122 Type 2 diabetes mellitus with diabetic chronic kidney disease: Secondary | ICD-10-CM | POA: Diagnosis not present

## 2018-12-01 DIAGNOSIS — Z7982 Long term (current) use of aspirin: Secondary | ICD-10-CM | POA: Insufficient documentation

## 2018-12-01 DIAGNOSIS — Z91048 Other nonmedicinal substance allergy status: Secondary | ICD-10-CM | POA: Diagnosis not present

## 2018-12-01 DIAGNOSIS — I480 Paroxysmal atrial fibrillation: Secondary | ICD-10-CM | POA: Diagnosis not present

## 2018-12-01 DIAGNOSIS — I5082 Biventricular heart failure: Secondary | ICD-10-CM | POA: Insufficient documentation

## 2018-12-01 DIAGNOSIS — Z955 Presence of coronary angioplasty implant and graft: Secondary | ICD-10-CM | POA: Diagnosis not present

## 2018-12-01 DIAGNOSIS — N1832 Chronic kidney disease, stage 3b: Secondary | ICD-10-CM | POA: Insufficient documentation

## 2018-12-01 DIAGNOSIS — Z880 Allergy status to penicillin: Secondary | ICD-10-CM | POA: Insufficient documentation

## 2018-12-01 DIAGNOSIS — I472 Ventricular tachycardia: Secondary | ICD-10-CM | POA: Diagnosis not present

## 2018-12-01 DIAGNOSIS — R570 Cardiogenic shock: Secondary | ICD-10-CM | POA: Diagnosis not present

## 2018-12-01 DIAGNOSIS — Z95811 Presence of heart assist device: Secondary | ICD-10-CM | POA: Diagnosis not present

## 2018-12-01 DIAGNOSIS — I255 Ischemic cardiomyopathy: Secondary | ICD-10-CM | POA: Insufficient documentation

## 2018-12-01 DIAGNOSIS — I4729 Other ventricular tachycardia: Secondary | ICD-10-CM

## 2018-12-01 LAB — IRON AND TIBC
Iron: 47 ug/dL (ref 45–182)
Saturation Ratios: 14 % — ABNORMAL LOW (ref 17.9–39.5)
TIBC: 339 ug/dL (ref 250–450)
UIBC: 292 ug/dL

## 2018-12-01 LAB — BASIC METABOLIC PANEL
Anion gap: 9 (ref 5–15)
BUN: 23 mg/dL (ref 8–23)
CO2: 26 mmol/L (ref 22–32)
Calcium: 9 mg/dL (ref 8.9–10.3)
Chloride: 102 mmol/L (ref 98–111)
Creatinine, Ser: 1.44 mg/dL — ABNORMAL HIGH (ref 0.61–1.24)
GFR calc Af Amer: 57 mL/min — ABNORMAL LOW (ref >60)
GFR calc non Af Amer: 50 mL/min — ABNORMAL LOW (ref >60)
Glucose, Bld: 224 mg/dL — ABNORMAL HIGH (ref 70–99)
Potassium: 4.3 mmol/L (ref 3.5–5.1)
Sodium: 137 mmol/L (ref 135–145)

## 2018-12-01 LAB — CBC
HCT: 38.7 % — ABNORMAL LOW (ref 39.0–52.0)
Hemoglobin: 11.7 g/dL — ABNORMAL LOW (ref 13.0–17.0)
MCH: 27.3 pg (ref 26.0–34.0)
MCHC: 30.2 g/dL (ref 30.0–36.0)
MCV: 90.2 fL (ref 80.0–100.0)
Platelets: 207 10*3/uL (ref 150–400)
RBC: 4.29 MIL/uL (ref 4.22–5.81)
RDW: 15.3 % (ref 11.5–15.5)
WBC: 5.8 10*3/uL (ref 4.0–10.5)
nRBC: 0 % (ref 0.0–0.2)

## 2018-12-01 LAB — MAGNESIUM: Magnesium: 2 mg/dL (ref 1.7–2.4)

## 2018-12-01 LAB — FERRITIN: Ferritin: 37 ng/mL (ref 24–336)

## 2018-12-01 LAB — PROTIME-INR
INR: 2.7 — ABNORMAL HIGH (ref 0.8–1.2)
Prothrombin Time: 28 seconds — ABNORMAL HIGH (ref 11.4–15.2)

## 2018-12-01 LAB — LACTATE DEHYDROGENASE: LDH: 191 U/L (ref 98–192)

## 2018-12-01 LAB — FOLATE: Folate: 19.8 ng/mL (ref >5.9)

## 2018-12-01 LAB — VITAMIN B12: Vitamin B-12: 611 pg/mL (ref 180–914)

## 2018-12-01 NOTE — Progress Notes (Signed)
LVAD INR 

## 2018-12-01 NOTE — Progress Notes (Signed)
Patient presents for 2 month f/u in [facility] today with wife. Denies any issues with VAD equipment. Reports several "alarms" over past few days.  Pt says he feels good and has no limitations with his daily activities. 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  No alarms noted on system monitor history, but controller history revealed two low flows on 9/9 and two on 9/8. Pt says he did not feel bad on either day. Pt says he is drinking @ 1 - 1.5 liters fluid/day. He had increased # of PIs on 11/27/18; pt says he did not drink as much that day. Pt is taking Torsemide 10 mg prn; reports he has only taken twice since last visit. He took a dose this am for "swelling" in lower feet/legs.   Pt reports he has not started taking showers, feels it is "too much" and is content to continue with what he has been doing in past.   Pt confirms he decreased amiodarone dose to 100 mg daily as instructed last visit.   Vital Signs: Temp: 98.2 Doppler Pressure: 100 Automatc BP:  133/67 (100) HR: 62 SPO2: 99% RA  Weight: 228.2 lb w/o eqt  Last weight: 223.4 lb w/o eqip  VAD Indication: Destination Therapy- Implanted 08/16/17   LVAD assessment: HM III: VAD Speed: 5100 Flow: 3.6 Power: 3.5w    PI: 6.2 Alarms:  2 Low Flow on 9/8 and 2 on 9/9  Events:  0 - 5 daily except >70 on 9/27 Hct: 20  Fixed speed: 5300 Low speed limit: 5000  Primary: Back up battery expiring in 17 months. Secondary: Back up battery expiring in 17 months   I reviewed the LVAD parameters from today and compared the results to the patient's prior recorded data. LVAD interrogation was NEGATIVE for significant power changes, POSITIVE for clinical alarms and POSITIVE for PI events/speed drops. No other changes were made and pump is functioning within specified parameters. Pt is performing daily controller and system monitor self tests along with completing weekly and monthly maintenance for LVAD  equipment.  LVAD equipment check completed and is in good working order. Back-up equipment present.   Annual Equipment Maintenance on UBC/PM was performed on 08/03/2018.   Exit Site Care: Drive line is being maintained weekly by wife. Drive line exit site well healed and incorporated. The velour is fully implanted at exit site. Dressing dry and intact. No erythema or drainage. Stabilization device present and accurately applied. Pt denies fever or chills. Pt provided with 6 weekly dressing kits for home use.      Device: Medtronic 3 Lead Therapies: ON:  VF 200 bpm, FVT 200 - 240; VT 158 - 200 - ATP Therapies: AT/AF monitor on 171 bpm Pacing: DDD 60 Last check: 11/10/18  Labs:  Doppler 100 - reflective of MAP  Hgb 11.7 - No S/S of bleeding. Specifically denies melena/BRBPR or nosebleeds.  LDH stable at 191 and is within established baseline ranging from 170-250.  Denies tea-colored urine. No power elevations noted on interrogation.   Patient Instructions: 1. No changes in meds. 2. PharmD will call you with warfarin dosing 3. Return in one month for BP check 4. Return for full visit in 1 month  [place] Coordinator  Office: [phone]  24/7 Pager: [phone]

## 2018-12-01 NOTE — Patient Instructions (Signed)
1. No changes in meds. 2. PharmD will call you with warfarin dosing 3. Return in one month for BP check 4. Return for full visit in 1 month

## 2018-12-01 NOTE — Progress Notes (Signed)
VAD Clinic Note   HF: Dr. [name]   HPI: [name] [name] is a 68 y.o. male with h/o chronic systolic CHF due to ICM, s/p LVAD HM3 08/16/2017, [name], [name], [name], [name], [name], HTN, HL, and CKD III.  Admitted 08/11/17 with recurrent A/C systolic CHF. Milrinone added, but mixed venous saturation remained low. Seen by CT surgery/VAD team and deemed appropriate for HMIII under DT criteria. He underwent HMIII on 6/17. Chest was unable to be closed due to high intrathoracic pressure and RV failure.  He returned to the OR on 6/18 for evacuation of hematoma. Chest was later closed on 6/20. Post operative course complicated by  open chest, RV failure, recurrent VT, respiratory failure, and dysphagia. CCM consulted for vent management with extubation completed on 08/26/17.  As he improved all drips gradually weaned off. Prior to d/c  ramp echo was completed with speed optimized.  Spent 7/11 - 7/27 in CIR. Underwent hydrotherapy for his buttock wound(s). 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   Seen on 12/10/17 for VT at 180s. ATP attempted x 4, and then shocked with return to NSR. Started amio 200 bid. Saw EP in f/u and said to continue amio 200 bid for 3-4 weeks then got to 200 daily.   Ramp echo 2/20: - LVEF 20-25%. Good cannula position. LV has shrunk in size. RV moderately down. AoV opening on every beat at 5300. Septum shifted to left. Speed decreased to 5100. (5000 looked good as well). RVOT VTI 22  RHC 05/05/18  VAD speed 5100  RA = 5 RV = 44/7 PA = 43/10 (22) PCW = 13 Fick cardiac output/index =4.9/2.3 PVR = 1.9 WU FA sat = 96% PA sat = 68%, 71%   He presents today for f/u visit with his wife. Doing very well. More active. Can do all activities without too much problem. No CP or SOB. Had a few low flows on VAD. No dizziness. Only taking  diuretic occasionally.. Drinking 48-64 ounces per day. Denies orthopnea or PND. No fevers, chills or problems with driveline. No bleeding, melena or neuro symptoms.. Taking all meds as prescribed.     VAD Indication: Destination Therapy- Implanted 08/16/17   LVAD assessment: HM III: VAD Speed: 5100 Flow: 3.6 Power: 3.5w PI: 6.2 Alarms:  2 Low Flow on 9/8 and 2 on 9/9  Events:  0 - 5 daily except >70 on 9/27 Hct: 20  Fixed speed: 5300 Low speed limit: 5000  Primary: Back up battery expiring in 17 months. Secondary: Back up battery expiring in 17 months   I reviewed the LVAD parameters from todayand compared the results to the patient's prior recorded data.LVAD interrogation was NEGATIVEfor significant power changes, POSITIVEfor clinicalalarms and POSITIVE for PI events/speed drops. No other changes were madeand pump is functioning within specified parameters. Pt is performing daily controller and system monitor self tests along with completing weekly and monthly maintenance for LVAD equipment.  Past Medical History:  Diagnosis Date  . AICD (automatic cardioverter/defibrillator) present 02/05/2014   Upgrade to Medtronic biventricular ICD, serial number  [id number]   . Atrial flutter ([place]) 04/2012   s/p TEE-EPS+RFCA 04/2012  . CAD (coronary artery disease) [id number] X 2    RCA-T, 70% PL (off CFX), 99% Prox LAD/90% Dist LAD, S/P TAXUS stent x 2  . CHF (congestive heart failure) ([place])   .  Chronic anticoagulation   . Chronic systolic heart failure ([place])   . CKD (chronic kidney disease)   . Diabetic retinopathy ([place])   . DM type 2 (diabetes mellitus, type 2) ([facility])    insulin dependent  . HTN (hypertension)   . Hypercholesteremia    ablation  . ICD (implantable cardiac defibrillator) in place   . Ischemic cardiomyopathy March 2015   20-25% 2D   . Nephrolithiasis   . Ventricular tachycardia ([place])     Current Outpatient Medications  Medication Sig Dispense Refill   . acetaminophen (TYLENOL) 500 MG tablet Take 1,000 mg by mouth every 6 (six) hours as needed for moderate pain or headache.     Marland Kitchen amiodarone (PACERONE) 100 MG tablet Take 1 tablet (100 mg total) by mouth daily. 30 tablet 3  . ascorbic acid (VITAMIN C) 500 MG tablet Take 1 tablet (500 mg total) by mouth 2 (two) times daily. 100 tablet 0  . aspirin 81 MG chewable tablet Chew 1 tablet (81 mg total) by mouth daily.    Marland Kitchen gabapentin (NEURONTIN) 300 MG capsule Take 1 capsule (300 mg total) by mouth 2 (two) times daily. 60 capsule 3  . insulin NPH-regular Human (70-30) 100 UNIT/ML injection Inject 30 Units into the skin daily.    . Insulin Pen Needle (COMFORT EZ PEN NEEDLES) 32G X 6 MM MISC 1 application by Does not apply route at bedtime. 100 each 0  . losartan (COZAAR) 25 MG tablet Take 1 tablet (25 mg total) by mouth daily. 30 tablet 6  . magnesium oxide (MAG-OX) 400 (241.3 Mg) MG tablet Take 1 tablet (400 mg total) by mouth 2 (two) times daily. 60 tablet 0  . Multiple Vitamin (MULTIVITAMIN WITH MINERALS) TABS tablet Take 1 tablet by mouth daily.    Marland Kitchen torsemide (DEMADEX) 20 MG tablet Take 20 mg by mouth as needed. Pt taking 10 - 20 mg as needed for fluid    . warfarin (COUMADIN) 2.5 MG tablet Take on tablet daily or as directed (Patient taking differently: Take 2.5 mg (1 tab) daily or as directed) 120 tablet 3   No current facility-administered medications for this encounter.    Allergies  Allergen Reactions  . Penicillins Hives, Itching and Other (See Comments)    Did it involve swelling of the face/tongue/throat, SOB, or low BP? No Did it involve sudden or severe rash/hives, skin peeling, or any reaction on the inside of your mouth or nose? No Did you need to seek medical attention at a hospital or doctor's office? Unknown When did it last happen?childhood allergy If all above answers are "NO", may proceed with cephalosporin use.    Marland Kitchen Alcohol-Sulfur [Sulfur] Other (See Comments)     Burns skin   Review of systems complete and found to be negative unless listed in HPI.    Wt Readings from Last 3 Encounters:  12/01/18 108 kg (238 lb 3.2 oz)  10/06/18 101.2 kg (223 lb 3.2 oz)  08/04/18 94.8 kg (209 lb)    Vitals:   12/01/18 1027 12/01/18 1028  BP: (!) 100/0 133/67  Pulse:  62  Temp:  98.2 F (36.8 C)  SpO2:  99%  Weight:  108 kg (238 lb 3.2 oz)  Height:  5\' 10"  (1.778 m)   Vital Signs: Temp: 98.2 Doppler Pressure:100 Automatc BP:  133/67 (100) HR: 62 SPO2: 99% RA  Weight: 228.2 lb w/o eqt  Last weight: 223.4 lb w/o eqip  Physical Exam: General:  NAD.  HEENT:  normal  Neck: supple. JVP not elevated.  Carotids 2+ bilat; no bruits. No lymphadenopathy or thryomegaly appreciated. Cor: LVAD hum.  Lungs: Clear. Abdomen: obese soft, nontender, non-distended. No hepatosplenomegaly. No bruits or masses. Good bowel sounds. Driveline site clean. Anchor in place. 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 Warm trace edema  Neuro: alert & oriented x 3. No focal deficits. Moves all 4 without problem    ASSESSMENT AND PLAN:  1. Chronic systolic CHF with biventricular failure-> cardiogenic shock: - Echo 08/13/2017 EF 20-25%.s/p Medtronic BiV ICD in place. Cath 12/18 with stable 1v CAD.  - s/p HM-3 implant 08/16/17.  - Stable NYHA II - Volume status look good  2. VAD: s/p HM-3 implant 08/16/17 - VAD interrogated personally. Parameters stable..  - PI events much less frequent but having some low flows. Taking diuretics as needed - Ramp echo 2/20: LVEF 20-25%. Good cannula position. LV has shrunk in size. RV moderately down. AoV opening on every beat at 5300. Septum shifted to left. Speed decreased to 5100. (5000 looked good as well). RVOT VTI 22 - RHC 3/20. Well compensated. No significant RV failure. RA = 5 - INR goal 2-2.5 INR 2.7  Discussed dosing with PharmD personally. 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 No change,.   3. Acute on CKD III: - Creatinine stable 1.44  4. [name]/VF:  - ICD shock 12/09/17 with VT in 180s.  - Quiescent  - Continue amio100 daily  5. AFL/atrial fibrillation:  - S/p previous ablation. He is currently in NSR -Continue amio for VT.On coumadin as above.  - No change to current plan.    6. CAD s/p PCI of RCA and [id number] cath with stable CAD as above:  - No s/s of ischemia - Continue ASA and statin. He has completed CR  7. [name]: . - Per PCP.    8. Anemia:  - Hgb 11.7  9. HTN - BP mildly elevated. MAP ~ 100. Previously well controlled. Will see back for BP check 3-4 weeks  Total time spent 35 minutes. Over half that time spent discussing above.    Glori Bickers, MD  12/01/18

## 2018-12-02 ENCOUNTER — Telehealth (HOSPITAL_COMMUNITY): Payer: Self-pay | Admitting: Licensed Clinical Social Worker

## 2018-12-02 NOTE — Telephone Encounter (Signed)
Entered in error

## 2018-12-02 NOTE — Telephone Encounter (Signed)
CSW contacted caregiver to remind of virtual VAD support group on Monday and to review needs for Covid 19 public health concerns and emergency VAD pager number. Raquel Sarna, [place], [place]

## 2018-12-08 DIAGNOSIS — Z7901 Long term (current) use of anticoagulants: Secondary | ICD-10-CM | POA: Diagnosis not present

## 2018-12-08 LAB — POCT INR: INR: 1.9 — AB (ref 2.0–3.0)

## 2018-12-09 ENCOUNTER — Ambulatory Visit (HOSPITAL_COMMUNITY): Payer: Self-pay | Admitting: Pharmacist

## 2018-12-09 DIAGNOSIS — Z7901 Long term (current) use of anticoagulants: Secondary | ICD-10-CM

## 2018-12-09 NOTE — Progress Notes (Signed)
LVAD INR 

## 2018-12-15 LAB — POCT INR: INR: 2 (ref 2.0–3.0)

## 2018-12-16 ENCOUNTER — Ambulatory Visit (HOSPITAL_COMMUNITY): Payer: Self-pay | Admitting: Pharmacist

## 2018-12-16 DIAGNOSIS — Z7901 Long term (current) use of anticoagulants: Secondary | ICD-10-CM

## 2018-12-16 NOTE — Progress Notes (Signed)
LVAD INR 

## 2018-12-23 ENCOUNTER — Other Ambulatory Visit (HOSPITAL_COMMUNITY): Payer: Self-pay | Admitting: *Deleted

## 2018-12-23 ENCOUNTER — Ambulatory Visit (HOSPITAL_COMMUNITY): Payer: Self-pay | Admitting: Pharmacist

## 2018-12-23 DIAGNOSIS — Z95811 Presence of heart assist device: Secondary | ICD-10-CM

## 2018-12-23 DIAGNOSIS — D509 Iron deficiency anemia, unspecified: Secondary | ICD-10-CM

## 2018-12-23 DIAGNOSIS — Z7901 Long term (current) use of anticoagulants: Secondary | ICD-10-CM

## 2018-12-23 LAB — POCT INR: INR: 2.1 (ref 2.0–3.0)

## 2018-12-23 NOTE — Progress Notes (Signed)
Spoke with Josph Macho re: need for IV Feraheme infusion x 2 in short stay. Scheduled 12/30/18 at 10:00. We will see him in clinic afterwards for his follow up BP check. He verbalized understanding of all the above.   Emerson Monte RN [place] Coordinator  Office: [phone]  24/7 Pager: [phone]

## 2018-12-23 NOTE — Progress Notes (Signed)
LVAD INR 

## 2018-12-27 ENCOUNTER — Telehealth: Payer: Self-pay

## 2018-12-27 NOTE — Telephone Encounter (Signed)
Left message for patient to remind of missed remote transmission.  

## 2018-12-29 ENCOUNTER — Encounter (HOSPITAL_COMMUNITY): Payer: PPO

## 2018-12-29 LAB — POCT INR: INR: 2.6 (ref 2.0–3.0)

## 2018-12-30 ENCOUNTER — Ambulatory Visit (HOSPITAL_COMMUNITY): Payer: Self-pay | Admitting: Pharmacist

## 2018-12-30 ENCOUNTER — Other Ambulatory Visit: Payer: Self-pay

## 2018-12-30 ENCOUNTER — Encounter (HOSPITAL_COMMUNITY): Payer: Self-pay

## 2018-12-30 ENCOUNTER — Ambulatory Visit (HOSPITAL_COMMUNITY)
Admission: RE | Admit: 2018-12-30 | Discharge: 2018-12-30 | Disposition: A | Discharge status: Home / Self Care | Payer: PPO | Source: Ambulatory Visit | Attending: Cardiology | Admitting: Cardiology

## 2018-12-30 ENCOUNTER — Ambulatory Visit (HOSPITAL_COMMUNITY)
Admission: RE | Admit: 2018-12-30 | Discharge: 2018-12-30 | Disposition: A | Discharge status: Home / Self Care | Payer: PPO | Source: Ambulatory Visit | Attending: Internal Medicine | Admitting: Internal Medicine

## 2018-12-30 DIAGNOSIS — D509 Iron deficiency anemia, unspecified: Secondary | ICD-10-CM

## 2018-12-30 DIAGNOSIS — Z7901 Long term (current) use of anticoagulants: Secondary | ICD-10-CM

## 2018-12-30 MED ORDER — SODIUM CHLORIDE 0.9 % IV SOLN
510.0000 mg | INTRAVENOUS | Status: DC
  Administered 2018-12-30: 510 mg via INTRAVENOUS
  Filled 2018-12-30: qty 17

## 2018-12-30 NOTE — Progress Notes (Signed)
Patient presents for BP check in VAD Clinic today with wife. Denies any issues with VAD equipment. Reports several "alarms" over past few days.  No alarms noted on system monitor history, but controller history revealed LOW VOLTAGE on 10/22. Reports he fell asleep and forgot to go on his patient cable. No low flows seen on system monitor history 10/23-10/30.   Reports that he has been feeling great.    Vital Signs: Temp:  Doppler Pressure: 88 Automatc BP: 108/74 (85) HR: 61 SPO2: 98% RA  Weight: 228.2 lb w/o eqt  Last weight: 223.4 lb w/o eqip  VAD Indication: Destination Therapy- Implanted 08/16/17   LVAD assessment: HM III: VAD Speed: 5100 Flow: 3.6 Power: 3.5w    PI: 6.4 Alarms: none Events:  10-20 daily. Half are true suction. Reports drink ~ 1L a day Hct: 20  Fixed speed: 5300 Low speed limit: 5000  Primary: Back up battery expiring in 17 months. Secondary: Back up battery expiring in 17 months   I reviewed the LVAD parameters from today and compared the results to the patient's prior recorded data. LVAD interrogation was NEGATIVE for significant power changes, NEGATIVE for clinical alarms and POSITIVE for PI events/speed drops. No other changes were made and pump is functioning within specified parameters. Pt is performing daily controller and system monitor self tests along with completing weekly and monthly maintenance for LVAD equipment.  LVAD equipment check completed and is in good working order. Back-up equipment present.   Annual Equipment Maintenance on UBC/PM was performed on 08/03/2018.   Exit Site Care: Drive line is being maintained weekly by wife. Drive line exit site well healed and incorporated. The velour is fully implanted at exit site. Dressing dry and intact. No erythema or drainage. Stabilization device present and accurately applied. Pt denies fever or chills.    Device: Medtronic 3 Lead Therapies: ON:  VF 200 bpm, FVT 200 - 240; VT 158 -  200 - ATP Therapies: AT/AF monitor on 171 bpm Pacing: DDD 60 Last check: 11/10/18  Labs:  Doppler 88 - reflective of MAP  Hgb no labs drawn today - No S/S of bleeding. Specifically denies melena/BRBPR or nosebleeds.  LDH stable at no labs drawn today and is within established baseline ranging from 170-250.  Denies tea-colored urine. No power elevations noted on interrogation.   Patient Instructions: 1. No changes in meds 2. Return for full visit in 1 month  [place] Coordinator  Office: [phone]  24/7 Pager: [phone]

## 2018-12-30 NOTE — Discharge Instructions (Signed)

## 2018-12-30 NOTE — Progress Notes (Signed)
LVAD INR 

## 2019-01-02 NOTE — Progress Notes (Signed)
No ICM remote transmission received for 12/26/2018 and next ICM transmission scheduled for 02/10/2019.

## 2019-01-05 DIAGNOSIS — Z7901 Long term (current) use of anticoagulants: Secondary | ICD-10-CM | POA: Diagnosis not present

## 2019-01-05 LAB — POCT INR: INR: 2.9 (ref 2.0–3.0)

## 2019-01-06 ENCOUNTER — Ambulatory Visit (HOSPITAL_COMMUNITY): Payer: Self-pay | Admitting: Pharmacist

## 2019-01-06 ENCOUNTER — Ambulatory Visit (HOSPITAL_COMMUNITY)
Admission: RE | Admit: 2019-01-06 | Discharge: 2019-01-06 | Disposition: A | Discharge status: Home / Self Care | Payer: PPO | Source: Ambulatory Visit | Attending: Internal Medicine | Admitting: Internal Medicine

## 2019-01-06 ENCOUNTER — Other Ambulatory Visit: Payer: Self-pay

## 2019-01-06 DIAGNOSIS — D509 Iron deficiency anemia, unspecified: Secondary | ICD-10-CM

## 2019-01-06 DIAGNOSIS — Z7901 Long term (current) use of anticoagulants: Secondary | ICD-10-CM

## 2019-01-06 MED ORDER — SODIUM CHLORIDE 0.9 % IV SOLN
510.0000 mg | INTRAVENOUS | Status: AC
  Administered 2019-01-06: 510 mg via INTRAVENOUS
  Filled 2019-01-06: qty 17

## 2019-01-06 NOTE — Progress Notes (Signed)
LVAD INR 

## 2019-01-09 ENCOUNTER — Telehealth: Payer: Self-pay | Admitting: Nurse Practitioner

## 2019-01-09 NOTE — Telephone Encounter (Signed)
Patient is a former patient of [facility]. No providers are taking new patients at this time. Please advise [phone]

## 2019-01-09 NOTE — Telephone Encounter (Signed)
Mickel Baas are you willing to accept this patient?

## 2019-01-10 NOTE — Telephone Encounter (Signed)
Feel patient would do better to establish with MD for continued care; please offer him another office location.

## 2019-01-10 NOTE — Telephone Encounter (Signed)
LVM to inform patient we could get him set up at another location with an MD.

## 2019-01-12 LAB — POCT INR: INR: 2.1 (ref 2.0–3.0)

## 2019-01-13 ENCOUNTER — Ambulatory Visit (HOSPITAL_COMMUNITY): Payer: Self-pay | Admitting: Pharmacist

## 2019-01-13 DIAGNOSIS — Z7901 Long term (current) use of anticoagulants: Secondary | ICD-10-CM

## 2019-01-13 NOTE — Progress Notes (Signed)
LVAD INR 

## 2019-01-19 LAB — POCT INR: INR: 1.9 — AB (ref 2.0–3.0)

## 2019-01-20 ENCOUNTER — Ambulatory Visit (HOSPITAL_COMMUNITY): Payer: Self-pay | Admitting: Pharmacist

## 2019-01-20 NOTE — Progress Notes (Signed)
LVAD INR 

## 2019-01-29 LAB — POCT INR: INR: 1.8 — AB (ref 2.0–3.0)

## 2019-01-30 ENCOUNTER — Ambulatory Visit (HOSPITAL_COMMUNITY): Payer: Self-pay | Admitting: Pharmacist

## 2019-01-30 DIAGNOSIS — Z7901 Long term (current) use of anticoagulants: Secondary | ICD-10-CM

## 2019-01-30 NOTE — Progress Notes (Signed)
LVAD INR 

## 2019-02-01 NOTE — Telephone Encounter (Signed)
error 

## 2019-02-02 ENCOUNTER — Encounter (HOSPITAL_COMMUNITY): Payer: PPO

## 2019-02-05 DIAGNOSIS — Z7901 Long term (current) use of anticoagulants: Secondary | ICD-10-CM | POA: Diagnosis not present

## 2019-02-05 LAB — POCT INR: INR: 3.1 — AB (ref 2.0–3.0)

## 2019-02-06 ENCOUNTER — Ambulatory Visit (HOSPITAL_COMMUNITY): Payer: Self-pay | Admitting: Pharmacist

## 2019-02-06 ENCOUNTER — Encounter: Payer: Self-pay | Admitting: Neurology

## 2019-02-06 DIAGNOSIS — Z7901 Long term (current) use of anticoagulants: Secondary | ICD-10-CM

## 2019-02-06 NOTE — Progress Notes (Signed)
LVAD INR 

## 2019-02-08 ENCOUNTER — Other Ambulatory Visit (HOSPITAL_COMMUNITY): Payer: Self-pay | Admitting: *Deleted

## 2019-02-08 DIAGNOSIS — Z7901 Long term (current) use of anticoagulants: Secondary | ICD-10-CM

## 2019-02-08 DIAGNOSIS — Z95811 Presence of heart assist device: Secondary | ICD-10-CM

## 2019-02-09 ENCOUNTER — Ambulatory Visit (HOSPITAL_COMMUNITY)
Admission: RE | Admit: 2019-02-09 | Discharge: 2019-02-09 | Disposition: A | Discharge status: Home / Self Care | Payer: PPO | Source: Ambulatory Visit | Attending: Cardiology | Admitting: Cardiology

## 2019-02-09 ENCOUNTER — Ambulatory Visit (INDEPENDENT_AMBULATORY_CARE_PROVIDER_SITE_OTHER): Payer: PPO | Admitting: *Deleted

## 2019-02-09 ENCOUNTER — Ambulatory Visit (HOSPITAL_COMMUNITY): Payer: Self-pay | Admitting: Pharmacist

## 2019-02-09 ENCOUNTER — Other Ambulatory Visit: Payer: Self-pay

## 2019-02-09 ENCOUNTER — Encounter (HOSPITAL_COMMUNITY): Payer: Self-pay

## 2019-02-09 VITALS — BP 93/55 | HR 65 | Temp 97.5°F | Ht 70.0 in | Wt 243.4 lb

## 2019-02-09 DIAGNOSIS — Z794 Long term (current) use of insulin: Secondary | ICD-10-CM

## 2019-02-09 DIAGNOSIS — E7800 Pure hypercholesterolemia, unspecified: Secondary | ICD-10-CM | POA: Diagnosis not present

## 2019-02-09 DIAGNOSIS — E11319 Type 2 diabetes mellitus with unspecified diabetic retinopathy without macular edema: Secondary | ICD-10-CM | POA: Insufficient documentation

## 2019-02-09 DIAGNOSIS — Z95810 Presence of automatic (implantable) cardiac defibrillator: Secondary | ICD-10-CM | POA: Insufficient documentation

## 2019-02-09 DIAGNOSIS — I4891 Unspecified atrial fibrillation: Secondary | ICD-10-CM | POA: Diagnosis not present

## 2019-02-09 DIAGNOSIS — G63 Polyneuropathy in diseases classified elsewhere: Secondary | ICD-10-CM

## 2019-02-09 DIAGNOSIS — Z7901 Long term (current) use of anticoagulants: Secondary | ICD-10-CM

## 2019-02-09 DIAGNOSIS — Z955 Presence of coronary angioplasty implant and graft: Secondary | ICD-10-CM | POA: Diagnosis not present

## 2019-02-09 DIAGNOSIS — Z7982 Long term (current) use of aspirin: Secondary | ICD-10-CM | POA: Insufficient documentation

## 2019-02-09 DIAGNOSIS — I5022 Chronic systolic (congestive) heart failure: Secondary | ICD-10-CM | POA: Diagnosis not present

## 2019-02-09 DIAGNOSIS — E1165 Type 2 diabetes mellitus with hyperglycemia: Secondary | ICD-10-CM

## 2019-02-09 DIAGNOSIS — C9110 Chronic lymphocytic leukemia of B-cell type not having achieved remission: Secondary | ICD-10-CM | POA: Diagnosis not present

## 2019-02-09 DIAGNOSIS — Z79899 Other long term (current) drug therapy: Secondary | ICD-10-CM | POA: Insufficient documentation

## 2019-02-09 DIAGNOSIS — I480 Paroxysmal atrial fibrillation: Secondary | ICD-10-CM

## 2019-02-09 DIAGNOSIS — I5043 Acute on chronic combined systolic (congestive) and diastolic (congestive) heart failure: Secondary | ICD-10-CM

## 2019-02-09 DIAGNOSIS — E1122 Type 2 diabetes mellitus with diabetic chronic kidney disease: Secondary | ICD-10-CM | POA: Insufficient documentation

## 2019-02-09 DIAGNOSIS — G629 Polyneuropathy, unspecified: Secondary | ICD-10-CM | POA: Insufficient documentation

## 2019-02-09 DIAGNOSIS — I5082 Biventricular heart failure: Secondary | ICD-10-CM | POA: Diagnosis not present

## 2019-02-09 DIAGNOSIS — I255 Ischemic cardiomyopathy: Secondary | ICD-10-CM | POA: Diagnosis not present

## 2019-02-09 DIAGNOSIS — D649 Anemia, unspecified: Secondary | ICD-10-CM | POA: Insufficient documentation

## 2019-02-09 DIAGNOSIS — N1832 Chronic kidney disease, stage 3b: Secondary | ICD-10-CM | POA: Insufficient documentation

## 2019-02-09 DIAGNOSIS — Z95811 Presence of heart assist device: Secondary | ICD-10-CM

## 2019-02-09 DIAGNOSIS — I472 Ventricular tachycardia: Secondary | ICD-10-CM

## 2019-02-09 DIAGNOSIS — I130 Hypertensive heart and chronic kidney disease with heart failure and stage 1 through stage 4 chronic kidney disease, or unspecified chronic kidney disease: Secondary | ICD-10-CM | POA: Diagnosis not present

## 2019-02-09 DIAGNOSIS — I4729 Other ventricular tachycardia: Secondary | ICD-10-CM

## 2019-02-09 DIAGNOSIS — I2510 Atherosclerotic heart disease of native coronary artery without angina pectoris: Secondary | ICD-10-CM | POA: Diagnosis not present

## 2019-02-09 LAB — CUP PACEART REMOTE DEVICE CHECK
Battery Remaining Longevity: 11 mo
Battery Voltage: 2.89 V
Brady Statistic AP VP Percent: 88.99 %
Brady Statistic AP VS Percent: 0.56 %
Brady Statistic AS VP Percent: 10.38 %
Brady Statistic AS VS Percent: 0.07 %
Brady Statistic RA Percent Paced: 89.43 %
Brady Statistic RV Percent Paced: 99.23 %
Date Time Interrogation Session: 20201210061711
HighPow Impedance: 40 Ohm
HighPow Impedance: 49 Ohm
Implantable Lead Implant Date: 20071219
Implantable Lead Implant Date: 20151207
Implantable Lead Implant Date: 20151207
Implantable Lead Location: 753858
Implantable Lead Location: 753859
Implantable Lead Location: 753860
Implantable Lead Model: 4598
Implantable Lead Model: 5076
Implantable Lead Model: 6947
Implantable Pulse Generator Implant Date: 20151207
Lead Channel Impedance Value: 323 Ohm
Lead Channel Impedance Value: 342 Ohm
Lead Channel Impedance Value: 380 Ohm
Lead Channel Impedance Value: 380 Ohm
Lead Channel Impedance Value: 399 Ohm
Lead Channel Impedance Value: 437 Ohm
Lead Channel Impedance Value: 494 Ohm
Lead Channel Impedance Value: 532 Ohm
Lead Channel Impedance Value: 570 Ohm
Lead Channel Impedance Value: 589 Ohm
Lead Channel Impedance Value: 779 Ohm
Lead Channel Impedance Value: 817 Ohm
Lead Channel Impedance Value: 817 Ohm
Lead Channel Pacing Threshold Amplitude: 0.75 V
Lead Channel Pacing Threshold Amplitude: 1 V
Lead Channel Pacing Threshold Amplitude: 1.375 V
Lead Channel Pacing Threshold Pulse Width: 0.4 ms
Lead Channel Pacing Threshold Pulse Width: 0.4 ms
Lead Channel Pacing Threshold Pulse Width: 0.4 ms
Lead Channel Sensing Intrinsic Amplitude: 0.875 mV
Lead Channel Sensing Intrinsic Amplitude: 0.875 mV
Lead Channel Sensing Intrinsic Amplitude: 5.5 mV
Lead Channel Sensing Intrinsic Amplitude: 5.5 mV
Lead Channel Setting Pacing Amplitude: 0.5 V
Lead Channel Setting Pacing Amplitude: 1.5 V
Lead Channel Setting Pacing Amplitude: 2.5 V
Lead Channel Setting Pacing Pulse Width: 0.03 ms
Lead Channel Setting Pacing Pulse Width: 0.4 ms
Lead Channel Setting Sensing Sensitivity: 0.3 mV

## 2019-02-09 LAB — CBC
HCT: 36 % — ABNORMAL LOW (ref 39.0–52.0)
Hemoglobin: 11 g/dL — ABNORMAL LOW (ref 13.0–17.0)
MCH: 27.8 pg (ref 26.0–34.0)
MCHC: 30.6 g/dL (ref 30.0–36.0)
MCV: 90.9 fL (ref 80.0–100.0)
Platelets: 205 10*3/uL (ref 150–400)
RBC: 3.96 MIL/uL — ABNORMAL LOW (ref 4.22–5.81)
RDW: 17.9 % — ABNORMAL HIGH (ref 11.5–15.5)
WBC: 5.1 10*3/uL (ref 4.0–10.5)
nRBC: 0 % (ref 0.0–0.2)

## 2019-02-09 LAB — COMPREHENSIVE METABOLIC PANEL
ALT: 26 U/L (ref 0–44)
AST: 22 U/L (ref 15–41)
Albumin: 3 g/dL — ABNORMAL LOW (ref 3.5–5.0)
Alkaline Phosphatase: 111 U/L (ref 38–126)
Anion gap: 12 (ref 5–15)
BUN: 41 mg/dL — ABNORMAL HIGH (ref 8–23)
CO2: 24 mmol/L (ref 22–32)
Calcium: 9.2 mg/dL (ref 8.9–10.3)
Chloride: 106 mmol/L (ref 98–111)
Creatinine, Ser: 1.98 mg/dL — ABNORMAL HIGH (ref 0.61–1.24)
GFR calc Af Amer: 39 mL/min — ABNORMAL LOW (ref >60)
GFR calc non Af Amer: 34 mL/min — ABNORMAL LOW (ref >60)
Glucose, Bld: 112 mg/dL — ABNORMAL HIGH (ref 70–99)
Potassium: 4.3 mmol/L (ref 3.5–5.1)
Sodium: 142 mmol/L (ref 135–145)
Total Bilirubin: 0.3 mg/dL (ref 0.3–1.2)
Total Protein: 8.2 g/dL — ABNORMAL HIGH (ref 6.5–8.1)

## 2019-02-09 LAB — PROTIME-INR
INR: 2.8 — ABNORMAL HIGH (ref 0.8–1.2)
Prothrombin Time: 29.3 seconds — ABNORMAL HIGH (ref 11.4–15.2)

## 2019-02-09 LAB — PREALBUMIN: Prealbumin: 21.6 mg/dL (ref 18–38)

## 2019-02-09 LAB — MAGNESIUM: Magnesium: 2.3 mg/dL (ref 1.7–2.4)

## 2019-02-09 LAB — LACTATE DEHYDROGENASE: LDH: 224 U/L — ABNORMAL HIGH (ref 98–192)

## 2019-02-09 MED ORDER — GABAPENTIN 300 MG PO CAPS: 600.0000 mg | ORAL_CAPSULE | Freq: Two times a day (BID) | ORAL | 5 refills | Status: AC

## 2019-02-09 MED ORDER — TORSEMIDE 20 MG PO TABS: 20.0000 mg | ORAL_TABLET | ORAL | 3 refills | Status: AC

## 2019-02-09 NOTE — Progress Notes (Signed)
LVAD INR 

## 2019-02-09 NOTE — Progress Notes (Signed)
Patient presents for 2 month f/u in [facility] today with wife. Denies any issues with VAD equipment or drive line site issues.   Pt says he has been doing well, no VAD alarms. Does report weight gain and feels he has "some fluid on board". Weight up 15 lbs from last visit 2 months ago.He has taken prn Torsemide 20 mg twice weekly over last two weeks.  He says his only limiting factor physically is let neuropathic pain. He is taking gabapentin 300 mg twice daily, but reports leg pain/tingling has gotten much worse over last two months. Genetic test for TTR amyloid drawn today.   Vital Signs: Temp: 97.5 Doppler Pressure: 78 Automatc BP:  93/55 (66) HR: 65 SPO2: UTO  Weight: 243.4 lb w/o eqt  Last weight: 228.2 lb w/o eqip  VAD Indication: Destination Therapy- Implanted 08/16/17   LVAD assessment: HM III: VAD Speed: 5100 Flow: 3.7 Power: 3.5w    PI: 4.8 Alarms:  Several low voltages Events:  5 - 10 Hct: 20  Fixed speed: 5100 Low speed limit: 5000  Primary: Back up battery expiring in 16 months. Secondary: Back up battery expiring in 16 months   I reviewed the LVAD parameters from today and compared the results to the patient's prior recorded data. LVAD interrogation was NEGATIVE for significant power changes, POSITIVE for clinical alarms and POSITIVE for PI events/speed drops. No other changes were made and pump is functioning within specified parameters. Pt is performing daily controller and system monitor self tests along with completing weekly and monthly maintenance for LVAD equipment.  LVAD equipment check completed and is in good working order. Back-up equipment present.   Annual Equipment Maintenance on UBC/PM was performed on 08/03/2018.   Exit Site Care: Drive line is being maintained weekly by wife. Drive line exit site well healed and incorporated. The velour is fully implanted at exit site. Dressing dry and intact. No erythema or drainage. Stabilization device  present and accurately applied. Pt denies fever or chills. Pt provided with 8 weekly dressing kits for home use.     Device: Medtronic 3 Lead Therapies: ON:  VF 200 bpm, FVT 200 - 240; VT 158 - 200 - ATP Therapies: AT/AF monitor on 171 bpm Pacing: DDD 60 Last check: 11/10/18  Labs:  Doppler 78 - reflective of MAP  Hgb 11.0 - No S/S of bleeding. Specifically denies melena/BRBPR or nosebleeds.  LDH pending; established baseline ranging from 170-250.  Denies tea-colored urine. No power elevations noted on interrogation.   1.5 year [facility] follow up completed including:  Quality of Life, KCCQ-12, and Neurocognitive trail making.   Pt attempted feet during 6 minute walk; walked 5 min 39 sec and completed 600 ft with multiple rest stops for leg pain.  Back up controller: 11V backup battery charged during this visit   Patient Instructions: 1. Increase gabapentin to 600 mg twice daily - Rx sent. 2 Take Torsemide 20 mg on Mon/Wed/Fri 3. PharmD will call you with INR results and warfarin dosing 4. Return to [facility] in 2 months.  Zada Girt RN [place] Coordinator  Office: [phone]  24/7 Pager: [phone]

## 2019-02-09 NOTE — Progress Notes (Signed)
VAD Clinic Note   HF: Dr. [name]   HPI: [name] [name] is a 68 y.o. male with h/o chronic systolic CHF due to ICM, s/p LVAD HM3 08/16/2017, [name], [name], [name], [name], [name], HTN, HL, and CKD III.  Admitted 08/11/17 with recurrent A/C systolic CHF. Milrinone added, but mixed venous saturation remained low. Seen by CT surgery/VAD team and deemed appropriate for HMIII under DT criteria. He underwent HMIII on 6/17. Chest was unable to be closed due to high intrathoracic pressure and RV failure.  He returned to the OR on 6/18 for evacuation of hematoma. Chest was later closed on 6/20. Post operative course complicated by  open chest, RV failure, recurrent VT, respiratory failure, and dysphagia. CCM consulted for vent management with extubation completed on 08/26/17.  As he improved all drips gradually weaned off. Prior to d/c  ramp echo was completed with speed optimized.  Spent 7/11 - 7/27 in CIR. Underwent hydrotherapy for his buttock wound(s). 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   Seen on 12/10/17 for VT at 180s. ATP attempted x 4, and then shocked with return to NSR. Started amio 200 bid. Saw EP in f/u and said to continue amio 200 bid for 3-4 weeks then got to 200 daily.   Ramp echo 2/20: - LVEF 20-25%. Good cannula position. LV has shrunk in size. RV moderately down. AoV opening on every beat at 5300. Septum shifted to left. Speed decreased to 5100. (5000 looked good as well). RVOT VTI 22  RHC 05/05/18  VAD speed 5100  RA = 5 RV = 44/7 PA = 43/10 (22) PCW = 13 Fick cardiac output/index =4.9/2.3 PVR = 1.9 WU FA sat = 96% PA sat = 68%, 71%   He presents today for f/u visit with his wife. Says he feels great. Can do anything he wants to without too much trouble. Main complaint is neuropathy in his feet and legs. Also has mild edema.  Weight up 15-20 pounds over past 2 months.Taking torsemide 2x/week with good effect. Denies orthopnea or PND. No fevers, chills or problems with driveline. No bleeding, melena or neuro symptoms. No VAD alarms. Taking all meds as prescribed.   VAD Indication: Destination Therapy- Implanted 08/16/17   LVAD assessment: HM III: VAD Speed: 5100 Flow: 3.7 Power: 3.5w PI: 4.8 Alarms:  Several low voltages Events:  5 - 10 Hct: 20  Fixed speed: 5100 Low speed limit: 5000  Primary: Back up battery expiring in 16 months. Secondary: Back up battery expiring in 16 months   I reviewed the LVAD parameters from todayand compared the results to the patient's prior recorded data.LVAD interrogation was NEGATIVEfor significant power changes, POSITIVEfor clinicalalarms and POSITIVE for PI events/speed drops. No other changes were madeand pump is functioning within specified parameters. Pt is performing daily controller and system monitor self tests along with completing weekly and monthly maintenance for LVAD equipment.  LVAD equipment check completed and is in good working order. Back-up equipment present.   Annual Equipment Maintenance on UBC/PM was performed on 08/03/2018. I reviewed the LVAD parameters from todayand compared the results to the patient's prior recorded data.LVAD interrogation was NEGATIVEfor significant power changes, POSITIVEfor clinicalalarms and POSITIVE for PI events/speed drops. No other changes were madeand pump is functioning within specified parameters. Pt is performing daily controller and system monitor self tests along with completing weekly  and monthly maintenance for LVAD equipment.  Past Medical History:  Diagnosis Date  . AICD (automatic cardioverter/defibrillator) present 02/05/2014   Upgrade to Medtronic biventricular ICD, serial number  [id number]   . Atrial flutter ([place]) 04/2012   s/p TEE-EPS+RFCA 04/2012  . CAD (coronary artery disease)  [id number] X 2    RCA-T, 70% PL (off CFX), 99% Prox LAD/90% Dist LAD, S/P TAXUS stent x 2  . CHF (congestive heart failure) ([place])   . Chronic anticoagulation   . Chronic systolic heart failure ([place])   . CKD (chronic kidney disease)   . Diabetic retinopathy ([place])   . DM type 2 (diabetes mellitus, type 2) (HCC)    insulin dependent  . HTN (hypertension)   . Hypercholesteremia    ablation  . ICD (implantable cardiac defibrillator) in place   . Ischemic cardiomyopathy March 2015   20-25% 2D   . Nephrolithiasis   . Ventricular tachycardia ([place])     Current Outpatient Medications  Medication Sig Dispense Refill  . acetaminophen (TYLENOL) 500 MG tablet Take 1,000 mg by mouth every 6 (six) hours as needed for moderate pain or headache.     Marland Kitchen amiodarone (PACERONE) 100 MG tablet Take 1 tablet (100 mg total) by mouth daily. 30 tablet 3  . ascorbic acid (VITAMIN C) 500 MG tablet Take 1 tablet (500 mg total) by mouth 2 (two) times daily. 100 tablet 0  . aspirin 81 MG chewable tablet Chew 1 tablet (81 mg total) by mouth daily.    Marland Kitchen gabapentin (NEURONTIN) 300 MG capsule Take 1 capsule (300 mg total) by mouth 2 (two) times daily. 60 capsule 3  . insulin NPH-regular Human (70-30) 100 UNIT/ML injection Inject 30 Units into the skin daily.    . Insulin Pen Needle (COMFORT EZ PEN NEEDLES) 32G X 6 MM MISC 1 application by Does not apply route at bedtime. 100 each 0  . losartan (COZAAR) 25 MG tablet Take 1 tablet (25 mg total) by mouth daily. 30 tablet 6  . magnesium oxide (MAG-OX) 400 (241.3 Mg) MG tablet Take 1 tablet (400 mg total) by mouth 2 (two) times daily. 60 tablet 0  . Multiple Vitamin (MULTIVITAMIN WITH MINERALS) TABS tablet Take 1 tablet by mouth daily.    Marland Kitchen torsemide (DEMADEX) 20 MG tablet Take 20 mg by mouth as needed. Pt taking 10 - 20 mg as needed for fluid    . warfarin (COUMADIN) 2.5 MG tablet Take on tablet daily or as directed (Patient taking differently: Take 2.5 mg (1 tab) daily or as  directed) 120 tablet 3   No current facility-administered medications for this encounter.   Allergies  Allergen Reactions  . Penicillins Hives, Itching and Other (See Comments)    Did it involve swelling of the face/tongue/throat, SOB, or low BP? No Did it involve sudden or severe rash/hives, skin peeling, or any reaction on the inside of your mouth or nose? No Did you need to seek medical attention at a hospital or doctor's office? Unknown When did it last happen?childhood allergy If all above answers are "NO", may proceed with cephalosporin use.    Marland Kitchen Alcohol-Sulfur [Sulfur] Other (See Comments)    Burns skin   Review of systems complete and found to be negative unless listed in HPI.    Wt Readings from Last 3 Encounters:  02/09/19 110.4 kg (243 lb 6.4 oz)  01/06/19 101.6 kg (224 lb)  12/30/18 101.6 kg (224 lb)    Vitals:   02/09/19  1026 02/09/19 1027  BP: (!) 78/0 (!) 93/55  Pulse:  65  Temp:  (!) 97.5 F (36.4 C)  Weight:  110.4 kg (243 lb 6.4 oz)  Height:  5\' 10"  (1.778 m)   Vital Signs: Temp: 97.5 Doppler Pressure:78 Automatc BP:  93/55 (66) HR: 65 SPO2: UTO  Weight: 243.4 lb w/o eqt  Last weight: 228.2 lb w/o eqip  Physical Exam: General:  NAD.  HEENT: normal  Neck: supple. JVP 7-8  Carotids 2+ bilat; no bruits. No lymphadenopathy or thryomegaly appreciated. Cor: LVAD hum.  Lungs: Clear. Abdomen: obese soft, nontender, non-distended. No hepatosplenomegaly. No bruits or masses. Good bowel sounds. Driveline site clean. Anchor in place.  Extremities: no cyanosis, clubbing, rash. Warm Trace edema  Neuro: alert & oriented x 3. No focal deficits. Moves all 4 without problem     ASSESSMENT AND PLAN:  1. Chronic systolic CHF with biventricular failure-> cardiogenic shock: - Echo 08/13/2017 EF 20-25%.s/p Medtronic BiV ICD in place. Cath 12/18 with stable 1v CAD.  - s/p HM-3 implant 08/16/17.  - Stable NYHA II - Volume status mildly elevated.  Increase torsemide to 20 3x/week (MWF). Take extra as needed as long as he  Is not getting recurrent low flow alarms  2. VAD: s/p HM-3 implant 08/16/17 - VAD interrogated personally. Parameters stable. - Stlll with PI events but no low flows. Several low voltage alarms which were discussed. - Ramp echo 2/20: LVEF 20-25%. Good cannula position. LV has shrunk in size. RV moderately down. AoV opening on every beat at 5300. Septum shifted to left. Speed decreased to 5100. (5000 looked good as well). RVOT VTI 22 - RHC 3/20. Well compensated. No significant RV failure. RA = 5 - INR goal 2-2.5 INR 2.8   Discussed dosing with PharmD personally. 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 No change. Good incorporation  3. Acute on CKD IIIb: - Creatinine ranges 1.5-2.0.  - today 1.9. Watch with increased torsemide  4. [name]/VF:  - ICD shock 12/09/17 with VT in 180s.  - Quiescent  - Continue amio 100 daily  5. AFL/atrial fibrillation:  - S/p previous ablation. He is currently in NSR -Continue amio for VT.On coumadin as above.  - No change to current plan.    6. CAD s/p PCI of RCA and [id number] cath with stable CAD as above:  - No s/s ischemia - Continue ASA and statin. He has completed CR  7. [name]: . - Per PCP.   - Consider SGLT2i  8. Anemia:  - Hgb 11.0  9. HTN - Blood pressure well controlled. Continue current regimen.  10. 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 Check genetic screen for TTR amyloid. Consider repeat PYP scan  Total time spent 35 minutes. Over half that time spent discussing above.   Glori Bickers, MD  02/09/19

## 2019-02-09 NOTE — Patient Instructions (Signed)
1. Increase gabapentin to 600 mg twice daily - Rx sent. 2 Take Torsemide 20 mg on Mon/Wed/Fri 3. PharmD will call you with INR results and warfarin dosing 4. Return to [facility] in 2 months.

## 2019-02-10 ENCOUNTER — Ambulatory Visit (INDEPENDENT_AMBULATORY_CARE_PROVIDER_SITE_OTHER): Payer: PPO

## 2019-02-10 ENCOUNTER — Ambulatory Visit: Payer: PPO | Admitting: Neurology

## 2019-02-10 DIAGNOSIS — I5022 Chronic systolic (congestive) heart failure: Secondary | ICD-10-CM | POA: Diagnosis not present

## 2019-02-10 DIAGNOSIS — Z95810 Presence of automatic (implantable) cardiac defibrillator: Secondary | ICD-10-CM

## 2019-02-13 LAB — MULTIPLE MYELOMA PANEL, SERUM
Albumin SerPl Elph-Mcnc: 3.2 g/dL (ref 2.9–4.4)
Albumin/Glob SerPl: 0.7 (ref 0.7–1.7)
Alpha 1: 0.2 g/dL (ref 0.0–0.4)
Alpha2 Glob SerPl Elph-Mcnc: 0.8 g/dL (ref 0.4–1.0)
B-Globulin SerPl Elph-Mcnc: 1.7 g/dL — ABNORMAL HIGH (ref 0.7–1.3)
Gamma Glob SerPl Elph-Mcnc: 1.9 g/dL — ABNORMAL HIGH (ref 0.4–1.8)
Globulin, Total: 4.7 g/dL — ABNORMAL HIGH (ref 2.2–3.9)
IgA: 1064 mg/dL — ABNORMAL HIGH (ref 61–437)
IgG (Immunoglobin G), Serum: 2381 mg/dL — ABNORMAL HIGH (ref 603–1613)
IgM (Immunoglobulin M), Srm: 41 mg/dL (ref 20–172)
Total Protein ELP: 7.9 g/dL (ref 6.0–8.5)

## 2019-02-14 NOTE — Progress Notes (Signed)
EPIC Encounter for ICM Monitoring  Patient Name: [name] [name] is a 68 y.o. male Date: 02/14/2019 Primary Care Physican: [name], NP Primary Cardiologist:[name] Electrophysiologist: [name] BiV Pacing: 99.2% 12/10/2020Weight:243lbs  Battery ERI: Estimated at49months  Transmission Reviewed.  OptivolThoracic impedance below normal but trending back to baseline.   Prescribed:Torsemide 20 mg take 1 tablet every Monday, Wednesday and Friday.  Labs: 02/09/2019 Creatinine 1.98, BUN 41, Potassium 4.3, Sodium 142, GFR 34-39 12/01/2018 Creatinine 1.44, BUN 23, Potassium 4.3, Sodium 137, GFR 50-57 10/06/2018 Creatinine 1.57, BUN 22, Potassium 5.2, Sodium 134, GFR 45-52 08/03/2018 Creatinine 2.15, BUN 39, Potassium 5.0, Sodium 140, GFR 31-35 A complete set of results can be found in results review  Recommendations: No changes.  Follow-up plan: ICM clinic phone appointment on 03/20/2019.   91 day device clinic remote transmission 05/11/2019.  Office appt 04/13/2019 with VAD clinic.    Copy of ICM check sent to Dr. [name].  3 month ICM trend: 02/09/2019    1 Year ICM trend:       [name], RN 02/14/2019 3:38 PM

## 2019-02-16 ENCOUNTER — Ambulatory Visit (HOSPITAL_COMMUNITY): Payer: Self-pay | Admitting: Pharmacist

## 2019-02-16 ENCOUNTER — Other Ambulatory Visit: Payer: Self-pay

## 2019-02-16 ENCOUNTER — Encounter: Payer: Self-pay | Admitting: Emergency Medicine

## 2019-02-16 ENCOUNTER — Ambulatory Visit
Admission: EM | Admit: 2019-02-16 | Discharge: 2019-02-16 | Disposition: A | Discharge status: Home / Self Care | Payer: PPO | Attending: Physician Assistant | Admitting: Physician Assistant

## 2019-02-16 ENCOUNTER — Telehealth (HOSPITAL_COMMUNITY): Payer: Self-pay | Admitting: *Deleted

## 2019-02-16 DIAGNOSIS — R059 Cough, unspecified: Secondary | ICD-10-CM

## 2019-02-16 DIAGNOSIS — R05 Cough: Secondary | ICD-10-CM

## 2019-02-16 DIAGNOSIS — J029 Acute pharyngitis, unspecified: Secondary | ICD-10-CM | POA: Diagnosis not present

## 2019-02-16 DIAGNOSIS — Z20828 Contact with and (suspected) exposure to other viral communicable diseases: Secondary | ICD-10-CM | POA: Diagnosis not present

## 2019-02-16 DIAGNOSIS — Z7901 Long term (current) use of anticoagulants: Secondary | ICD-10-CM

## 2019-02-16 DIAGNOSIS — Z7689 Persons encountering health services in other specified circumstances: Secondary | ICD-10-CM | POA: Diagnosis not present

## 2019-02-16 LAB — POCT INR: INR: 1.9 — AB (ref 2.0–3.0)

## 2019-02-16 LAB — POC SARS CORONAVIRUS 2 AG -  ED: SARS Coronavirus 2 Ag: NEGATIVE

## 2019-02-16 NOTE — Progress Notes (Signed)
LVAD INR 

## 2019-02-16 NOTE — ED Provider Notes (Signed)
[facility]    CSN: [id number] Arrival date & time: 02/16/19  1659      History   Chief Complaint Chief Complaint  Patient presents with  . Cough    HPI [name] [name] is a 68 y.o. male.   68 year old male with extensive cardiac history with LVAD on warfarin, [name], HTN, HLD, CKD comes in for 1 week history of cough, nasal congestion, rhinorrhea. Denies fever, chills, body aches. Denies abdominal pain, nausea, vomiting, diarrhea. Denies shortness of breath, loss of taste/smell. He weighs himself daily due to CHF without increase in weight. Denies chest pain, palpitations, worsening leg swelling, orthopnea.  States he called his cardiologist, who told him to rule out Covid.  Last INR checked today, 1.9, no adjustment to medication.      Past Medical History:  Diagnosis Date  . AICD (automatic cardioverter/defibrillator) present 02/05/2014   Upgrade to Medtronic biventricular ICD, serial number  BLD [id number]   . Atrial flutter ([place]) 04/2012   s/p TEE-EPS+RFCA 04/2012  . CAD (coronary artery disease) [id number] X 2    RCA-T, 70% PL (off CFX), 99% Prox LAD/90% Dist LAD, S/P TAXUS stent x 2  . CHF (congestive heart failure) ([place])   . Chronic anticoagulation   . Chronic systolic heart failure ([place])   . CKD (chronic kidney disease)   . Diabetic retinopathy ([place])   . DM type 2 (diabetes mellitus, type 2) (HCC)    insulin dependent  . HTN (hypertension)   . Hypercholesteremia    ablation  . ICD (implantable cardiac defibrillator) in place   . Ischemic cardiomyopathy March 2015   20-25% 2D   . Nephrolithiasis   . Ventricular tachycardia [facility])     Patient Active Problem List   Diagnosis Date Noted  . Uncontrolled type 2 diabetes mellitus with hyperglycemia, with long-term current use of insulin ([place]) 02/10/2018  . Peripheral neuropathy 11/11/2017  . Long term (current) use of anticoagulants 09/29/2017  . Decubitus ulcer of sacral region, stage 2 ([place])  09/24/2017  . Irregular prostate   . Sleep disturbance   . Dysphagia   . Diabetes mellitus type 2 in nonobese (HCC)   . Labile blood glucose   . Debility 09/10/2017  . Type 2 diabetes mellitus with retinopathy, with long-term current use of insulin ([place])   . Stage 3 chronic kidney disease   . Leukocytosis   . Pressure injury of skin 08/27/2017  . Metabolic alkalosis   . Presence of left ventricular assist device (LVAD) ([place])   . S/P TVR (tricuspid valve repair) 08/17/2017  . Goals of care, counseling/discussion   . Palliative care encounter   . Hx of atrial flutter 11/18/2016  . CAD (coronary artery disease)-RCA-T, 70% PL (off CFX), 99% Prox LAD/90% Dist LAD, S/P TAXUS stent x 2 11/18/2016  . HTN (hypertension) 11/18/2016  . Diabetes mellitus with complication ([place]) [id number]  . Bilateral lower extremity edema 11/12/2016  . Diabetic polyneuropathy associated with diabetes mellitus due to underlying condition ([place]) 05/29/2015  . Chronic systolic CHF (congestive heart failure) ([place]) 02/05/2014  . Medtronic biventricular ICD, serial number  BLD [id number]  02/05/2014  . Chronic renal disease, stage III ([place]) 01/08/2014  . Mitral regurgitation 04/17/2013  . Atrial flutter ([place]) 06/03/2012  . Chronic anticoagulation   . Nausea with vomiting 05/18/2012  . PAF (paroxysmal atrial fibrillation) ([place]) 05/17/2012  . Orthopnea 05/17/2012  . TIA (transient ischemic attack) 05/29/2010  . CHEST PAIN 11/13/2008  . VENTRICULAR TACHYCARDIA 08/14/2008  .  Automatic implantable cardioverter-defibrillator in situ 08/14/2008  . Type 2 diabetes with nephropathy ([place]) 08/11/2008  . DYSLIPIDEMIA 08/11/2008  . Cardiomyopathy, ischemic 08/11/2008  . Disorder resulting from impaired renal function 08/11/2008  . HYPERCHOLESTEROLEMIA 04/29/2006  . MYOCARDIAL INFARCTION, OLD 04/29/2006  . NEPHROLITHIASIS 04/29/2006    Past Surgical History:  Procedure Laterality Date  . ATRIAL FLUTTER ABLATION N/A  05/19/2012   Procedure: ATRIAL FLUTTER ABLATION;  Surgeon: Thompson Grayer, MD;  Location: [facility] CATH LAB;  Service: Cardiovascular;  Laterality: N/A;  . BI-VENTRICULAR IMPLANTABLE CARDIOVERTER DEFIBRILLATOR UPGRADE N/A 02/05/2014   Procedure: BI-VENTRICULAR IMPLANTABLE CARDIOVERTER DEFIBRILLATOR UPGRADE;  Surgeon: Evans Lance, MD;  Location[facility] CATH LAB;  Service: Cardiovascular;  Laterality: N/A;  . BIV ICD GENERTAOR CHANGE OUT  02/05/2014   Upgrade to Medtronic biventricular ICD, serial number  BLD [id number] by Dr. [name]  . CARDIAC DEFIBRILLATOR PLACEMENT  2007    Medtronic Maximo VR, serial number [id number]  . INSERTION OF IMPLANTABLE LEFT VENTRICULAR ASSIST DEVICE N/A 08/16/2017   Procedure: INSERTION OF IMPLANTABLE LEFT VENTRICULAR ASSIST DEVICE-HM3;  Surgeon: Ivin Poot, MD;  Location: [place];  Service: Open Heart Surgery;  Laterality: N/A;  . IR FLUORO GUIDE CV LINE RIGHT  08/12/2017  . IR US GUIDE VASC ACCESS RIGHT  08/12/2017  . MEDIASTINAL EXPLORATION  08/17/2017   Procedure: MEDIASTINAL REXPLORATION with evacuation of hematoma;  Surgeon: Prescott Gum, Collier Salina, MD;  Location: [facility] OR;  Service: Open Heart Surgery;;  . PERCUTANEOUS CORONARY STENT INTERVENTION (PCI-S)  January 2002   PTCA/Stent Distal RCA  . PERCUTANEOUS CORONARY STENT INTERVENTION (PCI-S)  June 2002   PTCA/Stent x 3 RCA, thrombolysis - failed  . PERCUTANEOUS CORONARY STENT INTERVENTION (PCI-S)  July 2006   TAXUS stents to prox and distal LAD  . RIGHT HEART CATH N/A 07/22/2017   Procedure: RIGHT HEART CATH;  Surgeon: Jolaine Artist, MD;  Location: [place] CV LAB;  Service: Cardiovascular;  Laterality: N/A;  . RIGHT HEART CATH N/A 08/13/2017   Procedure: RIGHT HEART CATH - swan;  Surgeon: Jolaine Artist, MD;  Location: [place] CV LAB;  Service: Cardiovascular;  Laterality: N/A;  . RIGHT HEART CATH N/A 05/05/2018   Procedure: RIGHT HEART CATH;  Surgeon: Jolaine Artist, MD;  Location: [place] CV LAB;   Service: Cardiovascular;  Laterality: N/A;  . RIGHT/LEFT HEART CATH AND CORONARY ANGIOGRAPHY N/A 02/18/2017   Procedure: RIGHT/LEFT HEART CATH AND CORONARY ANGIOGRAPHY;  Surgeon: Jolaine Artist, MD;  Location: [place] CV LAB;  Service: Cardiovascular;  Laterality: N/A;  . STERNAL CLOSURE N/A 08/19/2017   Procedure: STERNAL CLOSURE;  Surgeon: Ivin Poot, MD;  Location: [place];  Service: Thoracic;  Laterality: N/A;  . TEE WITHOUT CARDIOVERSION N/A 08/16/2017   Procedure: TRANSESOPHAGEAL ECHOCARDIOGRAM (TEE);  Surgeon: Prescott Gum, Collier Salina, MD;  Location: [place];  Service: Open Heart Surgery;  Laterality: N/A;  . TEE WITHOUT CARDIOVERSION N/A 08/19/2017   Procedure: TRANSESOPHAGEAL ECHOCARDIOGRAM (TEE);  Surgeon: Prescott Gum, Collier Salina, MD;  Location: [place];  Service: Thoracic;  Laterality: N/A;  . TRICUSPID VALVE REPLACEMENT N/A 08/16/2017   Procedure: TRICUSPID VALVE REPAIR using Oletta Lamas MC3 Ring size 30;  Surgeon: Ivin Poot, MD;  Location: [place];  Service: Open Heart Surgery;  Laterality: N/A;       Home Medications    Prior to Admission medications   Medication Sig Start Date End Date Taking? Authorizing Provider  acetaminophen (TYLENOL) 500 MG tablet Take 1,000 mg by mouth every 6 (six)  hours as needed for moderate pain or headache.     [provider]  amiodarone (PACERONE) 100 MG tablet Take 1 tablet (100 mg total) by mouth daily. 10/06/18   Bensimhon, Shaune Pascal, MD  ascorbic acid (VITAMIN C) 500 MG tablet Take 1 tablet (500 mg total) by mouth 2 (two) times daily. 09/24/17   Love, Ivan Anchors, PA-C  aspirin 81 MG chewable tablet Chew 1 tablet (81 mg total) by mouth daily. 09/10/17   Clegg, Aarit Kashuba D, NP  gabapentin (NEURONTIN) 300 MG capsule Take 2 capsules (600 mg total) by mouth 2 (two) times daily. 02/09/19   Bensimhon, Shaune Pascal, MD  insulin NPH-regular Human (70-30) 100 UNIT/ML injection Inject 30 Units into the skin daily.    [provider]  Insulin Pen Needle (COMFORT  EZ PEN NEEDLES) 32G X 6 MM MISC 1 application by Does not apply route at bedtime. 09/24/17   Love, Ivan Anchors, PA-C  losartan (COZAAR) 25 MG tablet Take 1 tablet (25 mg total) by mouth daily. 09/27/18   Bensimhon, Shaune Pascal, MD  magnesium oxide (MAG-OX) 400 (241.3 Mg) MG tablet Take 1 tablet (400 mg total) by mouth 2 (two) times daily. 09/24/17   Love, Ivan Anchors, PA-C  Multiple Vitamin (MULTIVITAMIN WITH MINERALS) TABS tablet Take 1 tablet by mouth daily. 09/25/17   Love, Ivan Anchors, PA-C  torsemide (DEMADEX) 20 MG tablet Take 1 tablet (20 mg total) by mouth every Monday, Wednesday, and Friday. 02/09/19   Bensimhon, Shaune Pascal, MD  warfarin (COUMADIN) 2.5 MG tablet Take on tablet daily or as directed Patient taking differently: Take 2.5 mg (1 tab) daily or as directed 10/06/18   Bensimhon, Shaune Pascal, MD    Family History Family History  Problem Relation Age of Onset  . Heart failure Father        Deceased  . Alzheimer's disease Mother        Living  . Heart attack Mother   . CAD Brother   . Other Brother        blood cancer  . CAD Brother   . Prostate cancer Brother   . CAD Brother   . CAD Brother   . Healthy Son   . Healthy Daughter   . Diabetes Brother   . Stomach cancer Neg Hx   . Colon cancer Neg Hx     Social History Social History   Tobacco Use  . Smoking status: Former Smoker    Packs/day: 1.00    Years: 29.00    Pack years: 29.00    Types: Cigarettes    Quit date: 03/02/2000    Years since quitting: 18.9  . Smokeless tobacco: Never Used  Substance Use Topics  . Alcohol use: No    Alcohol/week: 0.0 standard drinks  . Drug use: No     Allergies   Penicillins and Alcohol-sulfur [sulfur]   Review of Systems Review of Systems  Reason unable to perform ROS: See HPI as above.     Physical Exam Triage Vital Signs ED Triage Vitals  Enc Vitals Group     BP 02/16/19 1715 (!) 110/0     Pulse Rate 02/16/19 1715 80     Resp 02/16/19 1715 20     Temp 02/16/19 1715 97.8 F  (36.6 C)     Temp Source 02/16/19 1715 Temporal     SpO2 02/16/19 1715 93 %     Weight --      Height --  Head Circumference --      Peak Flow --      Pain Score 02/16/19 1718 0     Pain Loc --      Pain Edu? --      Excl. in [name]? --    No data found.  Updated Vital Signs BP (!) 110/0   Pulse 80   Temp 97.8 F (36.6 C) (Temporal)   Resp 20   SpO2 93% Comment: poor pleth for SpO2  Physical Exam Constitutional:      General: He is not in acute distress.    Appearance: He is well-developed. He is not diaphoretic.  HENT:     Head: Normocephalic and atraumatic.     Mouth/Throat:     Mouth: Mucous membranes are moist.     Pharynx: Oropharynx is clear. Uvula midline.  Eyes:     Conjunctiva/sclera: Conjunctivae normal.     Pupils: Pupils are equal, round, and reactive to light.  Cardiovascular:     Comments: LVAD hum Pulmonary:     Effort: Pulmonary effort is normal. No respiratory distress.     Comments: LCTAB Musculoskeletal:     Comments: 1+ pitting edema bilaterally, patient states at baseline. Denies tenderness. No erythema, warmth.   Skin:    General: Skin is warm and dry.  Neurological:     Mental Status: He is alert and oriented to person, place, and time.      UC Treatments / Results  Labs (all labs ordered are listed, but only abnormal results are displayed) Labs Reviewed  POC SARS CORONAVIRUS 2 AG -  ED - Normal  NOVEL CORONAVIRUS, NAA    EKG   Radiology No results found.  Procedures Procedures (including critical care time)  Medications Ordered in UC Medications - No data to display  Initial Impression / Assessment and Plan / UC Course  I have reviewed the triage vital signs and the nursing notes.  Pertinent labs & imaging results that were available during my care of the patient were reviewed by me and considered in my medical decision making (see chart for details).    Rapid Covid negative.  PCR Covid sent.  No alarming signs on  exam.  Patient with bilateral 1+ pitting edema to the lower extremity, for which patient states is at baseline.  Weight stable without any increase.  No chest pain, shortness of breath, orthopnea.  Lungs are clear to auscultation bilaterally without adventitious lung sounds.  Will have patient monitor closely, follow-up with cardiology for further evaluation if symptoms not improving.  Strict return precautions given.  Patient expresses understanding and agrees to plan.  Final Clinical Impressions(s) / UC Diagnoses   Final diagnoses:  Cough  Sore throat   ED Prescriptions    None     PDMP not reviewed this encounter.   Ok Edwards, PA-C 02/16/19 1829

## 2019-02-16 NOTE — ED Notes (Signed)
Patient able to ambulate independently  

## 2019-02-16 NOTE — Discharge Instructions (Signed)
Rapid COVID negative. COVID PCR testing ordered. I would like you to quarantine until testing results. You can take over the counter flonase/nasacort to help with nasal congestion/drainage. If any worsening of symptoms, chest pain, shortness of breath, increase in weight/leg swelling, go to the ED for further evaluation needed.

## 2019-02-16 NOTE — ED Triage Notes (Signed)
Pt presents to [facility] for assessment of cough whenever he tries to talk and scratchy throat.

## 2019-02-16 NOTE — Telephone Encounter (Signed)
Pt called VAD pager to report new onset of cough. Reports it happens when he is speaking; pt did cough during our conversation. Reports he has "little" sputum described as "brownish". He denies fever or chills.  He confirms he increased Torsemide to 20 mg MWF as instructed last visit. Reports he voids a lot "sometimes, but not every time he takes it". His home weight has not changed he says since last visit.  Dr. [name] updated; instructed pt to go for Covid testing. Pt and wife agreed to plan and "know where to go for testing".  Asked pt to call us with results. Pt verbalized understanding and agreement to same.  Zada Girt RN, [place] Coordinator [phone]

## 2019-02-18 LAB — NOVEL CORONAVIRUS, NAA: SARS-CoV-2, NAA: NOT DETECTED

## 2019-02-20 ENCOUNTER — Telehealth (HOSPITAL_COMMUNITY): Payer: Self-pay | Admitting: *Deleted

## 2019-02-20 NOTE — Telephone Encounter (Signed)
Received results from pt's TTR test done via [facility].  Results:  Negative 02/18/2019  Results given to Dr [name].

## 2019-02-22 ENCOUNTER — Telehealth (HOSPITAL_COMMUNITY): Payer: Self-pay | Admitting: *Deleted

## 2019-02-22 DIAGNOSIS — I5022 Chronic systolic (congestive) heart failure: Secondary | ICD-10-CM

## 2019-02-22 DIAGNOSIS — K219 Gastro-esophageal reflux disease without esophagitis: Secondary | ICD-10-CM

## 2019-02-22 MED ORDER — PANTOPRAZOLE SODIUM 40 MG PO TBEC: 40.0000 mg | DELAYED_RELEASE_TABLET | Freq: Every day | ORAL | 11 refills | Status: AC

## 2019-02-22 NOTE — Telephone Encounter (Signed)
Wife called VAD office to report continued dry cough when patient talks. She says the cough has not gotten worse, remains "about the same".  No fevers or chills.   Pt is taking Torsemide 40 mg on M/W/F as directed, she says his wt has remained "about the same". Denies orthopnea, PND, abdominal fullness, increased pedal edema, or SOB.   Covid testing completed 02/16/19 and was negative.   Darrick Grinder, NP updated. Instructed wife per Amy that patient may take extra Torsemide 40 mg as needed for wt gain. Rx will be sent to local pharmacy for Protonix 40 mg daily for gastric reflux symptoms.  Asked wife to call if symptoms worsen or do not improve. She verbalized agreement to same.  Zada Girt RN, [place] Coordinator [phone]

## 2019-02-26 ENCOUNTER — Telehealth (HOSPITAL_COMMUNITY): Payer: Self-pay | Admitting: Unknown Physician Specialty

## 2019-02-26 ENCOUNTER — Other Ambulatory Visit: Payer: Self-pay

## 2019-02-26 ENCOUNTER — Emergency Department (EMERGENCY_DEPARTMENT_HOSPITAL)
Admission: EM | Admit: 2019-02-26 | Discharge: 2019-02-26 | Disposition: A | Discharge status: Home / Self Care | Payer: PPO | Source: Home / Self Care | Attending: Emergency Medicine | Admitting: Emergency Medicine

## 2019-02-26 ENCOUNTER — Emergency Department (HOSPITAL_COMMUNITY): Payer: PPO

## 2019-02-26 DIAGNOSIS — E669 Obesity, unspecified: Secondary | ICD-10-CM | POA: Diagnosis present

## 2019-02-26 DIAGNOSIS — I10 Essential (primary) hypertension: Secondary | ICD-10-CM | POA: Diagnosis not present

## 2019-02-26 DIAGNOSIS — T462X5A Adverse effect of other antidysrhythmic drugs, initial encounter: Secondary | ICD-10-CM | POA: Diagnosis not present

## 2019-02-26 DIAGNOSIS — Z9981 Dependence on supplemental oxygen: Secondary | ICD-10-CM | POA: Diagnosis not present

## 2019-02-26 DIAGNOSIS — Z20828 Contact with and (suspected) exposure to other viral communicable diseases: Secondary | ICD-10-CM | POA: Insufficient documentation

## 2019-02-26 DIAGNOSIS — R0600 Dyspnea, unspecified: Secondary | ICD-10-CM | POA: Diagnosis not present

## 2019-02-26 DIAGNOSIS — Z95810 Presence of automatic (implantable) cardiac defibrillator: Secondary | ICD-10-CM | POA: Diagnosis not present

## 2019-02-26 DIAGNOSIS — J984 Other disorders of lung: Secondary | ICD-10-CM | POA: Diagnosis not present

## 2019-02-26 DIAGNOSIS — R6521 Severe sepsis with septic shock: Secondary | ICD-10-CM | POA: Diagnosis not present

## 2019-02-26 DIAGNOSIS — E1165 Type 2 diabetes mellitus with hyperglycemia: Secondary | ICD-10-CM | POA: Diagnosis not present

## 2019-02-26 DIAGNOSIS — R918 Other nonspecific abnormal finding of lung field: Secondary | ICD-10-CM | POA: Diagnosis not present

## 2019-02-26 DIAGNOSIS — J9690 Respiratory failure, unspecified, unspecified whether with hypoxia or hypercapnia: Secondary | ICD-10-CM | POA: Diagnosis present

## 2019-02-26 DIAGNOSIS — Y95 Nosocomial condition: Secondary | ICD-10-CM | POA: Diagnosis not present

## 2019-02-26 DIAGNOSIS — I472 Ventricular tachycardia: Secondary | ICD-10-CM | POA: Diagnosis not present

## 2019-02-26 DIAGNOSIS — Z79899 Other long term (current) drug therapy: Secondary | ICD-10-CM | POA: Insufficient documentation

## 2019-02-26 DIAGNOSIS — I252 Old myocardial infarction: Secondary | ICD-10-CM | POA: Insufficient documentation

## 2019-02-26 DIAGNOSIS — I255 Ischemic cardiomyopathy: Secondary | ICD-10-CM | POA: Diagnosis present

## 2019-02-26 DIAGNOSIS — J702 Acute drug-induced interstitial lung disorders: Secondary | ICD-10-CM | POA: Diagnosis not present

## 2019-02-26 DIAGNOSIS — R000 Tachycardia, unspecified: Secondary | ICD-10-CM | POA: Diagnosis not present

## 2019-02-26 DIAGNOSIS — Z978 Presence of other specified devices: Secondary | ICD-10-CM | POA: Diagnosis not present

## 2019-02-26 DIAGNOSIS — Z4682 Encounter for fitting and adjustment of non-vascular catheter: Secondary | ICD-10-CM | POA: Diagnosis not present

## 2019-02-26 DIAGNOSIS — I5042 Chronic combined systolic (congestive) and diastolic (congestive) heart failure: Secondary | ICD-10-CM | POA: Insufficient documentation

## 2019-02-26 DIAGNOSIS — N183 Chronic kidney disease, stage 3 unspecified: Secondary | ICD-10-CM | POA: Insufficient documentation

## 2019-02-26 DIAGNOSIS — Z954 Presence of other heart-valve replacement: Secondary | ICD-10-CM | POA: Insufficient documentation

## 2019-02-26 DIAGNOSIS — Z95811 Presence of heart assist device: Secondary | ICD-10-CM | POA: Diagnosis not present

## 2019-02-26 DIAGNOSIS — R7989 Other specified abnormal findings of blood chemistry: Secondary | ICD-10-CM | POA: Diagnosis not present

## 2019-02-26 DIAGNOSIS — I5043 Acute on chronic combined systolic (congestive) and diastolic (congestive) heart failure: Secondary | ICD-10-CM | POA: Diagnosis not present

## 2019-02-26 DIAGNOSIS — E7800 Pure hypercholesterolemia, unspecified: Secondary | ICD-10-CM | POA: Diagnosis present

## 2019-02-26 DIAGNOSIS — I130 Hypertensive heart and chronic kidney disease with heart failure and stage 1 through stage 4 chronic kidney disease, or unspecified chronic kidney disease: Secondary | ICD-10-CM | POA: Diagnosis not present

## 2019-02-26 DIAGNOSIS — A419 Sepsis, unspecified organism: Secondary | ICD-10-CM | POA: Diagnosis not present

## 2019-02-26 DIAGNOSIS — R0603 Acute respiratory distress: Secondary | ICD-10-CM | POA: Diagnosis not present

## 2019-02-26 DIAGNOSIS — R0902 Hypoxemia: Secondary | ICD-10-CM | POA: Diagnosis not present

## 2019-02-26 DIAGNOSIS — Z7901 Long term (current) use of anticoagulants: Secondary | ICD-10-CM | POA: Insufficient documentation

## 2019-02-26 DIAGNOSIS — J189 Pneumonia, unspecified organism: Secondary | ICD-10-CM | POA: Diagnosis not present

## 2019-02-26 DIAGNOSIS — I361 Nonrheumatic tricuspid (valve) insufficiency: Secondary | ICD-10-CM | POA: Diagnosis not present

## 2019-02-26 DIAGNOSIS — N179 Acute kidney failure, unspecified: Secondary | ICD-10-CM | POA: Diagnosis not present

## 2019-02-26 DIAGNOSIS — I2510 Atherosclerotic heart disease of native coronary artery without angina pectoris: Secondary | ICD-10-CM | POA: Diagnosis present

## 2019-02-26 DIAGNOSIS — Z20822 Contact with and (suspected) exposure to covid-19: Secondary | ICD-10-CM | POA: Diagnosis not present

## 2019-02-26 DIAGNOSIS — J9601 Acute respiratory failure with hypoxia: Secondary | ICD-10-CM | POA: Diagnosis not present

## 2019-02-26 DIAGNOSIS — J849 Interstitial pulmonary disease, unspecified: Secondary | ICD-10-CM | POA: Diagnosis not present

## 2019-02-26 DIAGNOSIS — E1122 Type 2 diabetes mellitus with diabetic chronic kidney disease: Secondary | ICD-10-CM | POA: Diagnosis present

## 2019-02-26 DIAGNOSIS — R05 Cough: Secondary | ICD-10-CM | POA: Diagnosis not present

## 2019-02-26 DIAGNOSIS — E1140 Type 2 diabetes mellitus with diabetic neuropathy, unspecified: Secondary | ICD-10-CM | POA: Insufficient documentation

## 2019-02-26 DIAGNOSIS — I4821 Permanent atrial fibrillation: Secondary | ICD-10-CM | POA: Diagnosis present

## 2019-02-26 DIAGNOSIS — I5023 Acute on chronic systolic (congestive) heart failure: Secondary | ICD-10-CM

## 2019-02-26 DIAGNOSIS — R600 Localized edema: Secondary | ICD-10-CM | POA: Insufficient documentation

## 2019-02-26 DIAGNOSIS — Z794 Long term (current) use of insulin: Secondary | ICD-10-CM | POA: Insufficient documentation

## 2019-02-26 DIAGNOSIS — N1832 Chronic kidney disease, stage 3b: Secondary | ICD-10-CM | POA: Diagnosis present

## 2019-02-26 DIAGNOSIS — K8020 Calculus of gallbladder without cholecystitis without obstruction: Secondary | ICD-10-CM | POA: Diagnosis not present

## 2019-02-26 DIAGNOSIS — Z6835 Body mass index (BMI) 35.0-35.9, adult: Secondary | ICD-10-CM | POA: Diagnosis not present

## 2019-02-26 DIAGNOSIS — E43 Unspecified severe protein-calorie malnutrition: Secondary | ICD-10-CM | POA: Diagnosis not present

## 2019-02-26 DIAGNOSIS — Z95828 Presence of other vascular implants and grafts: Secondary | ICD-10-CM | POA: Diagnosis not present

## 2019-02-26 DIAGNOSIS — N170 Acute kidney failure with tubular necrosis: Secondary | ICD-10-CM | POA: Diagnosis not present

## 2019-02-26 DIAGNOSIS — I110 Hypertensive heart disease with heart failure: Secondary | ICD-10-CM | POA: Diagnosis not present

## 2019-02-26 DIAGNOSIS — R55 Syncope and collapse: Secondary | ICD-10-CM | POA: Insufficient documentation

## 2019-02-26 DIAGNOSIS — R570 Cardiogenic shock: Secondary | ICD-10-CM | POA: Diagnosis not present

## 2019-02-26 DIAGNOSIS — R531 Weakness: Secondary | ICD-10-CM | POA: Insufficient documentation

## 2019-02-26 DIAGNOSIS — Z452 Encounter for adjustment and management of vascular access device: Secondary | ICD-10-CM | POA: Diagnosis not present

## 2019-02-26 DIAGNOSIS — Z87891 Personal history of nicotine dependence: Secondary | ICD-10-CM | POA: Insufficient documentation

## 2019-02-26 DIAGNOSIS — E870 Hyperosmolality and hypernatremia: Secondary | ICD-10-CM | POA: Diagnosis not present

## 2019-02-26 DIAGNOSIS — J9600 Acute respiratory failure, unspecified whether with hypoxia or hypercapnia: Secondary | ICD-10-CM | POA: Diagnosis not present

## 2019-02-26 DIAGNOSIS — J9621 Acute and chronic respiratory failure with hypoxia: Secondary | ICD-10-CM | POA: Diagnosis not present

## 2019-02-26 DIAGNOSIS — R0602 Shortness of breath: Secondary | ICD-10-CM | POA: Diagnosis not present

## 2019-02-26 DIAGNOSIS — Z515 Encounter for palliative care: Secondary | ICD-10-CM | POA: Diagnosis not present

## 2019-02-26 DIAGNOSIS — J9602 Acute respiratory failure with hypercapnia: Secondary | ICD-10-CM | POA: Diagnosis not present

## 2019-02-26 DIAGNOSIS — Z9911 Dependence on respirator [ventilator] status: Secondary | ICD-10-CM | POA: Diagnosis not present

## 2019-02-26 DIAGNOSIS — Z01818 Encounter for other preprocedural examination: Secondary | ICD-10-CM | POA: Diagnosis not present

## 2019-02-26 LAB — COMPREHENSIVE METABOLIC PANEL
ALT: 37 U/L (ref 0–44)
AST: 47 U/L — ABNORMAL HIGH (ref 15–41)
Albumin: 2.5 g/dL — ABNORMAL LOW (ref 3.5–5.0)
Alkaline Phosphatase: 106 U/L (ref 38–126)
Anion gap: 10 (ref 5–15)
BUN: 32 mg/dL — ABNORMAL HIGH (ref 8–23)
CO2: 23 mmol/L (ref 22–32)
Calcium: 8.4 mg/dL — ABNORMAL LOW (ref 8.9–10.3)
Chloride: 97 mmol/L — ABNORMAL LOW (ref 98–111)
Creatinine, Ser: 2 mg/dL — ABNORMAL HIGH (ref 0.61–1.24)
GFR calc Af Amer: 39 mL/min — ABNORMAL LOW (ref >60)
GFR calc non Af Amer: 33 mL/min — ABNORMAL LOW (ref >60)
Glucose, Bld: 274 mg/dL — ABNORMAL HIGH (ref 70–99)
Potassium: 4.1 mmol/L (ref 3.5–5.1)
Sodium: 130 mmol/L — ABNORMAL LOW (ref 135–145)
Total Bilirubin: 1.2 mg/dL (ref 0.3–1.2)
Total Protein: 8.6 g/dL — ABNORMAL HIGH (ref 6.5–8.1)

## 2019-02-26 LAB — CBC WITH DIFFERENTIAL/PLATELET
Abs Immature Granulocytes: 0.07 10*3/uL (ref 0.00–0.07)
Basophils Absolute: 0 10*3/uL (ref 0.0–0.1)
Basophils Relative: 0 %
Eosinophils Absolute: 0 10*3/uL (ref 0.0–0.5)
Eosinophils Relative: 0 %
HCT: 33 % — ABNORMAL LOW (ref 39.0–52.0)
Hemoglobin: 10.3 g/dL — ABNORMAL LOW (ref 13.0–17.0)
Immature Granulocytes: 1 %
Lymphocytes Relative: 6 %
Lymphs Abs: 0.5 10*3/uL — ABNORMAL LOW (ref 0.7–4.0)
MCH: 26.9 pg (ref 26.0–34.0)
MCHC: 31.2 g/dL (ref 30.0–36.0)
MCV: 86.2 fL (ref 80.0–100.0)
Monocytes Absolute: 0.9 10*3/uL (ref 0.1–1.0)
Monocytes Relative: 11 %
Neutro Abs: 6.7 10*3/uL (ref 1.7–7.7)
Neutrophils Relative %: 82 %
Platelets: 236 10*3/uL (ref 150–400)
RBC: 3.83 MIL/uL — ABNORMAL LOW (ref 4.22–5.81)
RDW: 17.8 % — ABNORMAL HIGH (ref 11.5–15.5)
WBC: 8.2 10*3/uL (ref 4.0–10.5)
nRBC: 0 % (ref 0.0–0.2)

## 2019-02-26 LAB — SEDIMENTATION RATE: Sed Rate: 80 mm/hr — ABNORMAL HIGH (ref 0–16)

## 2019-02-26 LAB — BRAIN NATRIURETIC PEPTIDE: B Natriuretic Peptide: 439.6 pg/mL — ABNORMAL HIGH (ref 0.0–100.0)

## 2019-02-26 LAB — TROPONIN I (HIGH SENSITIVITY)
Troponin I (High Sensitivity): 5 ng/L (ref <18)
Troponin I (High Sensitivity): 6 ng/L (ref <18)

## 2019-02-26 LAB — POC SARS CORONAVIRUS 2 AG -  ED: SARS Coronavirus 2 Ag: NEGATIVE

## 2019-02-26 LAB — PROTIME-INR
INR: 2.3 — ABNORMAL HIGH (ref 0.8–1.2)
Prothrombin Time: 25.6 seconds — ABNORMAL HIGH (ref 11.4–15.2)

## 2019-02-26 LAB — SARS CORONAVIRUS 2 (TAT 6-24 HRS): SARS Coronavirus 2: NEGATIVE

## 2019-02-26 LAB — LACTATE DEHYDROGENASE: LDH: 323 U/L — ABNORMAL HIGH (ref 98–192)

## 2019-02-26 MED ORDER — FUROSEMIDE 10 MG/ML IJ SOLN
80.0000 mg | Freq: Once | INTRAMUSCULAR | Status: AC
  Administered 2019-02-26: 80 mg via INTRAVENOUS
  Filled 2019-02-26: qty 8

## 2019-02-26 MED ORDER — PREDNISONE 20 MG PO TABS: ORAL_TABLET | ORAL | 0 refills | Status: AC

## 2019-02-26 NOTE — Telephone Encounter (Signed)
Received page from pts wife today around 1145 am. Wife states that the pt is very weak and having difficulty speaking. Wife states that the pt may have loss consciousness for a few seconds and was incontinent of urine. Wife had already called 911. ED was notified along with rapid response and Dr. [name].  Pt was evaluated in the ED and was found to have fluid overload by labs and chest xray. The device interrogation of his medtronic device also showed fluid overload by optivol. Pt was given 80 mg IV Lasix in the ED and put out around 1 L of urine. Pt was evaluated by Dr. [name]. Pt did not want to be admitted at this time.   pts wife [name] was updated during his ED stay and at the time of discharge was instructed to have Josph Macho take 80 mg of Torsemide Monday and Tuesday and to STOP his Amiodarone per Dr. [name] as pt could possibly be exhibiting signs of Amiodarone toxicity.   We will follow up with the pt in the VAD clinic on Tuesday.  Tanda Rockers RN, BSN VAD Coordinator 24/7 Pager [phone]

## 2019-02-26 NOTE — ED Provider Notes (Signed)
I received this patient in signout from Dr. Melina Copa.  Patient is LVAD patient who was having peripheral edema with some shortness of breath and generalized weakness.  Being evaluated by his cardiologist, Dr. [name]. At time of signout, awaiting final cardiology recs and reassessment after IV lasix.  Pt voided several times after lasix. He has remained 100% on pulse ox and is comfortable on reassessment. COVID-19 test is pending. Dr. [name] evaluated pt and they agreed on plan for discharge, 80 mg torsemide at home tomorrow, and follow-up in the clinic in 2 days. He did add ESR which he will follow up regarding concern for possible amiodarone toxicity. He will advise patient based on results.  I have extensively reviewed return precautions including any worsening symptoms and patient has voiced understanding.   Ariyanah Aguado, Wenda Overland, MD 02/26/19 2300

## 2019-02-26 NOTE — Discharge Instructions (Addendum)
Take 80mg  TORSEMIDE tomorrow. Follow up in the cardiology clinic on Tuesday. Return to ER if any worsening symptoms including worsening breathing problems.

## 2019-02-26 NOTE — ED Notes (Signed)
Discharge instructions discussed with pt and wife.   Lavender Tube sent to lab by  RN Mel Almond   RN spoke to Rapid Response RN. Per RR RN pt able to disconnect and reconnect self to LV AD portable batteries.   Jenny Reichmann RN and Furniture conservator/restorer at bedside.

## 2019-02-26 NOTE — Consult Note (Addendum)
VAD Team Consult Note   HF: Dr. [name]   Requesting: Dr. Rosalyn Charters  HPI: [name] [name] is a 68 y.o. male with h/o chronic systolic CHF due to ICM, s/p LVAD HM3 08/16/2017, [name], [name], [name], [name], [name], HTN, HL, and CKD III.  We are seeing him today for further evaluation of cough and dyspnea at the request of Dr. Rex Kras in ER  Admitted 08/11/17 with recurrent A/C systolic CHF. Milrinone added, but mixed venous saturation remained low. Seen by CT surgery/VAD team and deemed appropriate for HMIII under DT criteria. He underwent HMIII on 6/17. Chest was unable to be closed due to high intrathoracic pressure and RV failure.  He returned to the OR on 6/18 for evacuation of hematoma. Chest was later closed on 6/20. Post operative course complicated by  open chest, RV failure, recurrent VT, respiratory failure, and dysphagia.  Seen on 12/10/17 for VT at 180s. ATP attempted x 4, and then shocked with return to NSR. Started amio 200 bid.  Ramp echo 2/20: - LVEF 20-25%. Good cannula position. LV has shrunk in size. RV moderately down. AoV opening on every beat at 5300. Septum shifted to left. Speed decreased to 5100. (5000 looked good as well). RVOT VTI 22  RHC 05/05/18  VAD speed 5100  RA = 5 RV = 44/7 PA = 43/10 (22) PCW = 13 Fick cardiac output/index =4.9/2.3 PVR = 1.9 WU FA sat = 96% PA sat = 68%, 71%  Recent history: Reports 1-2 weeks of increase cough and weakness. Seen in UC 1 week ago and COVID negative. Over past week couh has gotten worse. Also having some LE edema and orthopnea. Denies fevers or chills. No problems with taste or smell. No sick contacts. No problems with bleeding or DL. Dyspnea worse today. Family getting him up to get in car and had near syncopal episode. EMS brought to ER.   In ER MAP 100-110. Rhythm stable. AF. CXR with diffuse infiltrates. WBC ok.  HsTrop 6. BNP 439 (56 in 7/19) Renal function stable. Sats  99-100 on 2L  Given 80 IV lasix with ~1L urine output  ICD interrogation done personally: No VT/AF. Activity lefel < 1hr. Optivol way up. (Climbing since September)     VAD Indication: Destination Therapy- Implanted 08/16/17   LVAD assessment: HM III: VAD Speed: 5100 Flow: 3.9 Power: 3.4w PI: 4.3 Alarms: None Events:   Rare PI events Hct: 20  Fixed speed: 5100 Low speed limit: 5000  Primary: Back up battery expiring in 16 months. Secondary: Back up battery expiring in 16 months   I reviewed the LVAD parameters from todayand compared the results to the patient's prior recorded data.LVAD interrogation was NEGATIVEfor significant power changes, POSITIVEfor clinicalalarms and POSITIVE for PI events/speed drops. No other changes were madeand pump is functioning within specified parameters. Pt is performing daily controller and system monitor self tests along with completing weekly and monthly maintenance for LVAD equipment.  LVAD equipment check completed and is in good working order. Back-up equipment present.   Annual Equipment Maintenance on UBC/PM was performed on 08/03/2018. I reviewed the LVAD parameters from todayand compared the results to the patient's prior recorded data.LVAD interrogation was NEGATIVEfor significant power changes, POSITIVEfor clinicalalarms and POSITIVE for PI events/speed drops. No other changes were madeand pump is functioning within specified parameters. Pt is performing daily controller and system monitor self tests along with completing weekly and monthly maintenance for  LVAD equipment.  Past Medical History:  Diagnosis Date  . AICD (automatic cardioverter/defibrillator) present 02/05/2014   Upgrade to Medtronic biventricular ICD, serial number  [id number]   . Atrial flutter ([place]) 04/2012   s/p TEE-EPS+RFCA 04/2012  . CAD (coronary artery disease) 7948,0165 X 2    RCA-T, 70% PL (off CFX), 99% Prox LAD/90% Dist LAD, S/P TAXUS  stent x 2  . CHF (congestive heart failure) ([place])   . Chronic anticoagulation   . Chronic systolic heart failure ([place])   . CKD (chronic kidney disease)   . Diabetic retinopathy ([place])   . DM type 2 (diabetes mellitus, type 2) ([facility])    insulin dependent  . HTN (hypertension)   . Hypercholesteremia    ablation  . ICD (implantable cardiac defibrillator) in place   . Ischemic cardiomyopathy March 2015   20-25% 2D   . Nephrolithiasis   . Ventricular tachycardia ([place])     No current facility-administered medications for this encounter.   Current Outpatient Medications  Medication Sig Dispense Refill  . acetaminophen (TYLENOL) 500 MG tablet Take 1,000 mg by mouth every 6 (six) hours as needed for moderate pain or headache.     Marland Kitchen amiodarone (PACERONE) 100 MG tablet Take 1 tablet (100 mg total) by mouth daily. 30 tablet 3  . ascorbic acid (VITAMIN C) 500 MG tablet Take 1 tablet (500 mg total) by mouth 2 (two) times daily. 100 tablet 0  . aspirin 81 MG chewable tablet Chew 1 tablet (81 mg total) by mouth daily.    Marland Kitchen gabapentin (NEURONTIN) 300 MG capsule Take 2 capsules (600 mg total) by mouth 2 (two) times daily. 120 capsule 5  . insulin NPH-regular Human (70-30) 100 UNIT/ML injection Inject 30 Units into the skin daily.    Marland Kitchen losartan (COZAAR) 25 MG tablet Take 1 tablet (25 mg total) by mouth daily. 30 tablet 6  . magnesium oxide (MAG-OX) 400 (241.3 Mg) MG tablet Take 1 tablet (400 mg total) by mouth 2 (two) times daily. 60 tablet 0  . Multiple Vitamin (MULTIVITAMIN WITH MINERALS) TABS tablet Take 1 tablet by mouth daily.    . pantoprazole (PROTONIX) 40 MG tablet Take 1 tablet (40 mg total) by mouth daily. 30 tablet 11  . torsemide (DEMADEX) 20 MG tablet Take 1 tablet (20 mg total) by mouth every Monday, Wednesday, and Friday. 60 tablet 3  . triamcinolone (NASACORT ALLERGY 24HR) 55 MCG/ACT AERO nasal inhaler Place 2 sprays into the nose daily.    Marland Kitchen warfarin (COUMADIN) 2.5 MG tablet Take on  tablet daily or as directed (Patient taking differently: Take 2.5 mg (1 tab) daily or as directed) 120 tablet 3  . Insulin Pen Needle (COMFORT EZ PEN NEEDLES) 32G X 6 MM MISC 1 application by Does not apply route at bedtime. 100 each 0   Allergies  Allergen Reactions  . Penicillins Hives, Itching and Other (See Comments)    Did it involve swelling of the face/tongue/throat, SOB, or low BP? No Did it involve sudden or severe rash/hives, skin peeling, or any reaction on the inside of your mouth or nose? No Did you need to seek medical attention at a hospital or doctor's office? Unknown When did it last happen?childhood allergy If all above answers are "NO", may proceed with cephalosporin use.    Marland Kitchen Alcohol-Sulfur [Sulfur] Other (See Comments)    Burns skin   Review of systems complete and found to be negative unless listed in HPI.    Wt  Readings from Last 3 Encounters:  02/09/19 110.4 kg  01/06/19 101.6 kg  12/30/18 101.6 kg    Vitals:   02/26/19 1555 02/26/19 1611 02/26/19 1630 02/26/19 1730  BP:      Pulse: 62 60 (!) 59 86  Resp: (!) '28  14 16  ' SpO2: 95% 100% 98% 100%    Physical Exam: General:  Weak appearing. Hose voice. Otherwise NAD.  HEENT: normal  Neck: supple. JVP to jaw Carotids 2+ bilat; no bruits. No lymphadenopathy or thryomegaly appreciated. Cor: LVAD hum.  Lungs: basilar crackles Abdomen: obese soft, nontender, non-distended. No hepatosplenomegaly. No bruits or masses. Good bowel sounds. Driveline site clean. Anchor in place.  Extremities: no cyanosis, clubbing, rash. Warm 2+ edema  Neuro: alert & oriented x 3. No focal deficits. Moves all 4 without problem    ASSESSMENT AND PLAN:  1. Dyspnea - differential = COVID vs HF vs amio toxicity - based on current data HF seems like most likely diagnosis but rare to get this much pulmonary edema with normal functioning VAD so I am strongly suspicious of amio toxicity given CXR appearance and time course of  worsening - We gave him 80 IV lasix with some response. Sats ok  - Offered admission for further diuresis and possible RHC but he would like to avoid admission - Will increase torsemide to 80 daily for 3 days and assess response - Awaid full COVID test and will send off ESR - See back in HF Clinic on Tuesday - If no improvement will admit. If ESR up -> start prednisone.   2. Acute/ Chronic systolic CHF with biventricular failure-> cardiogenic shock: - Echo 08/13/2017 EF 20-25%.s/p Medtronic BiV ICD in place. Cath 12/18 with stable 1v CAD.  - s/p HM-3 implant 08/16/17.  - Worsening NYHA III-IIIB.  Volume overloaded. Plan as above  3. VAD: s/p HM-3 implant 08/16/17 - VAD interrogated personally. Parameters stable. - Watch for increasing PI events and possible low flows with diuresis - Ramp echo 2/20: LVEF 20-25%. Good cannula position. LV has shrunk in size. RV moderately down. AoV opening on every beat at 5300. Septum shifted to left. Speed decreased to 5100. (5000 looked good as well). 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 No change. Good incorporation  3. CKD IIIb: - Creatinine ranges 1.5-2.0.  - today 2.0  4. [name]/VF:  - ICD shock 12/09/17 with VT in 180s.  - ICD interrogated personally. No VT - I worry about possible amio lung toxicity. Hold amio for now. Check ESR   5. AFL/atrial fibrillation:  - S/p previous ablation. He is currently in NSR - holding amio. Continue coumadin   Glori Bickers, MD  02/26/19

## 2019-02-26 NOTE — ED Provider Notes (Addendum)
[facility] EMERGENCY DEPARTMENT Provider Note   CSN: [id number] Arrival date & time: 02/26/19  1257     History No chief complaint on file. cough, sob  [name] [name] is a 68 y.o. male.  He has a history of diabetes and cardiac disease and currently has an LVAD on warfarin.  He is complaining of a few days to possibly a few weeks of cough, generalized weakness, peripheral edema.  Today his family was trying to bring him here in the car when he experienced a brief syncopal event.  Denies any injury.  No chest pain.  No fever.  Covid testing about 10 days ago negative.  The history is provided by the patient.  Shortness of Breath Severity:  Moderate Onset quality:  Gradual Timing:  Constant Progression:  Worsening Chronicity:  Recurrent Context: activity   Relieved by:  Nothing Worsened by:  Activity and coughing Ineffective treatments:  Sitting up Associated symptoms: cough and syncope   Associated symptoms: no abdominal pain, no chest pain, no diaphoresis, no fever, no headaches, no hemoptysis, no rash, no sore throat and no vomiting        Past Medical History:  Diagnosis Date   AICD (automatic cardioverter/defibrillator) present 02/05/2014   Upgrade to Medtronic biventricular ICD, serial number  [id number]    Atrial flutter ([place]) 04/2012   s/p TEE-EPS+RFCA 04/2012   CAD (coronary artery disease) [id number] X 2    RCA-T, 70% PL (off CFX), 99% Prox LAD/90% Dist LAD, S/P TAXUS stent x 2   CHF (congestive heart failure) (HCC)    Chronic anticoagulation    Chronic systolic heart failure (HCC)    CKD (chronic kidney disease)    Diabetic retinopathy ([place])    DM type 2 (diabetes mellitus, type 2) (HCC)    insulin dependent   HTN (hypertension)    Hypercholesteremia    ablation   ICD (implantable cardiac defibrillator) in place    Ischemic cardiomyopathy March 2015   20-25% 2D    Nephrolithiasis    Ventricular tachycardia [facility])      Patient Active Problem List   Diagnosis Date Noted   Uncontrolled type 2 diabetes mellitus with hyperglycemia, with long-term current use of insulin ([place]) 02/10/2018   Peripheral neuropathy 11/11/2017   Long term (current) use of anticoagulants 09/29/2017   Decubitus ulcer of sacral region, stage 2 ([place]) 09/24/2017   Irregular prostate    Sleep disturbance    Dysphagia    Diabetes mellitus type 2 in nonobese ([place])    Labile blood glucose    Debility 09/10/2017   Type 2 diabetes mellitus with retinopathy, with long-term current use of insulin (HCC)    Stage 3 chronic kidney disease    Leukocytosis    Pressure injury of skin [id number]   Metabolic alkalosis    Presence of left ventricular assist device (LVAD) (HCC)    S/P TVR (tricuspid valve repair) 08/17/2017   Goals of care, counseling/discussion    Palliative care encounter    Hx of atrial flutter 11/18/2016   CAD (coronary artery disease)-RCA-T, 70% PL (off CFX), 99% Prox LAD/90% Dist LAD, S/P TAXUS stent x 2 11/18/2016   HTN (hypertension) 11/18/2016   Diabetes mellitus with complication ([place]) [id number]   Bilateral lower extremity edema 11/12/2016   Diabetic polyneuropathy associated with diabetes mellitus due to underlying condition ([place]) [id number]   Chronic systolic CHF (congestive heart failure) ([place]) 02/05/2014   Medtronic biventricular ICD, serial number  [id number]  02/05/2014  Chronic renal disease, stage III ([place]) 01/08/2014   Mitral regurgitation 04/17/2013   Atrial flutter ([place]) 06/03/2012   Chronic anticoagulation    Nausea with vomiting 05/18/2012   PAF (paroxysmal atrial fibrillation) ([place]) 05/17/2012   Orthopnea 05/17/2012   TIA (transient ischemic attack) 05/29/2010   CHEST PAIN 11/13/2008   VENTRICULAR TACHYCARDIA 08/14/2008   Automatic implantable cardioverter-defibrillator in situ 08/14/2008   Type 2 diabetes with nephropathy ([place]) 08/11/2008    DYSLIPIDEMIA 08/11/2008   Cardiomyopathy, ischemic 08/11/2008   Disorder resulting from impaired renal function 08/11/2008   HYPERCHOLESTEROLEMIA 04/29/2006   MYOCARDIAL INFARCTION, OLD 04/29/2006   NEPHROLITHIASIS 04/29/2006    Past Surgical History:  Procedure Laterality Date   ATRIAL FLUTTER ABLATION N/A 05/19/2012   Procedure: ATRIAL FLUTTER ABLATION;  Surgeon: Thompson Grayer, MD;  Location: [facility] CATH LAB;  Service: Cardiovascular;  Laterality: N/A;   BI-VENTRICULAR IMPLANTABLE CARDIOVERTER DEFIBRILLATOR UPGRADE N/A 02/05/2014   Procedure: BI-VENTRICULAR IMPLANTABLE CARDIOVERTER DEFIBRILLATOR UPGRADE;  Surgeon: Evans Lance, MD;  Location: [facility] CATH LAB;  Service: Cardiovascular;  Laterality: N/A;   BIV ICD GENERTAOR CHANGE OUT  02/05/2014   Upgrade to Medtronic biventricular ICD, serial number  [id number] by Dr. [name]   CARDIAC DEFIBRILLATOR PLACEMENT  2007    Medtronic Maximo VR, serial number [id number]   INSERTION OF IMPLANTABLE LEFT VENTRICULAR ASSIST DEVICE N/A 08/16/2017   Procedure: INSERTION OF IMPLANTABLE LEFT VENTRICULAR ASSIST DEVICE-HM3;  Surgeon: Ivin Poot, MD;  Location: [facility];  Service: Open Heart Surgery;  Laterality: N/A;   IR FLUORO GUIDE CV LINE RIGHT  08/12/2017   IR US GUIDE VASC ACCESS RIGHT  08/12/2017   MEDIASTINAL EXPLORATION  08/17/2017   Procedure: MEDIASTINAL REXPLORATION with evacuation of hematoma;  Surgeon: Prescott Gum, Collier Salina, MD;  Location: [facility];  Service: Open Heart Surgery;;   PERCUTANEOUS CORONARY STENT INTERVENTION (PCI-S)  January 2002   PTCA/Stent Distal RCA   PERCUTANEOUS CORONARY STENT INTERVENTION (PCI-S)  June 2002   PTCA/Stent x 3 RCA, thrombolysis - failed   PERCUTANEOUS CORONARY STENT INTERVENTION (PCI-S)  July 2006   TAXUS stents to prox and distal LAD   RIGHT HEART CATH N/A 07/22/2017   Procedure: RIGHT HEART CATH;  Surgeon: Jolaine Artist, MD;  Location: [facility] CV LAB;  Service: Cardiovascular;  Laterality:  N/A;   RIGHT HEART CATH N/A 08/13/2017   Procedure: RIGHT HEART CATH - swan;  Surgeon: Jolaine Artist, MD;  Location: [facility] CV LAB;  Service: Cardiovascular;  Laterality: N/A;   RIGHT HEART CATH N/A 05/05/2018   Procedure: RIGHT HEART CATH;  Surgeon: Jolaine Artist, MD;  Location: [facility] CV LAB;  Service: Cardiovascular;  Laterality: N/A;   RIGHT/LEFT HEART CATH AND CORONARY ANGIOGRAPHY N/A 02/18/2017   Procedure: RIGHT/LEFT HEART CATH AND CORONARY ANGIOGRAPHY;  Surgeon: Jolaine Artist, MD;  Location: [facility] CV LAB;  Service: Cardiovascular;  Laterality: N/A;   STERNAL CLOSURE N/A 08/19/2017   Procedure: STERNAL CLOSURE;  Surgeon: Ivin Poot, MD;  Location: [facility];  Service: Thoracic;  Laterality: N/A;   TEE WITHOUT CARDIOVERSION N/A 08/16/2017   Procedure: TRANSESOPHAGEAL ECHOCARDIOGRAM (TEE);  Surgeon: Prescott Gum, Collier Salina, MD;  Location: [facility];  Service: Open Heart Surgery;  Laterality: N/A;   TEE WITHOUT CARDIOVERSION N/A 08/19/2017   Procedure: TRANSESOPHAGEAL ECHOCARDIOGRAM (TEE);  Surgeon: Prescott Gum, Collier Salina, MD;  Location: [facility];  Service: Thoracic;  Laterality: N/A;   TRICUSPID VALVE REPLACEMENT N/A 08/16/2017   Procedure: TRICUSPID VALVE REPAIR using Caralyn Guile Ring  size 30;  Surgeon: Prescott Gum, Collier Salina, MD;  Location: [facility];  Service: Open Heart Surgery;  Laterality: N/A;       Family History  Problem Relation Age of Onset   Heart failure Father        Deceased   Alzheimer's disease Mother        Living   Heart attack Mother    CAD Brother    Other Brother        blood cancer   CAD Brother    Prostate cancer Brother    CAD Brother    CAD Brother    Healthy Son    Healthy Daughter    Diabetes Brother    Stomach cancer Neg Hx    Colon cancer Neg Hx     Social History   Tobacco Use   Smoking status: Former Smoker    Packs/day: 1.00    Years: 29.00    Pack years: 29.00    Types: Cigarettes    Quit date: 03/02/2000     Years since quitting: 19.0   Smokeless tobacco: Never Used  Substance Use Topics   Alcohol use: No    Alcohol/week: 0.0 standard drinks   Drug use: No    Home Medications Prior to Admission medications   Medication Sig Start Date End Date Taking? Authorizing Provider  acetaminophen (TYLENOL) 500 MG tablet Take 1,000 mg by mouth every 6 (six) hours as needed for moderate pain or headache.     [provider]  amiodarone (PACERONE) 100 MG tablet Take 1 tablet (100 mg total) by mouth daily. 10/06/18   Bensimhon, Shaune Pascal, MD  ascorbic acid (VITAMIN C) 500 MG tablet Take 1 tablet (500 mg total) by mouth 2 (two) times daily. 09/24/17   Love, Ivan Anchors, PA-C  aspirin 81 MG chewable tablet Chew 1 tablet (81 mg total) by mouth daily. 09/10/17   Clegg, Amy D, NP  gabapentin (NEURONTIN) 300 MG capsule Take 2 capsules (600 mg total) by mouth 2 (two) times daily. 02/09/19   Bensimhon, Shaune Pascal, MD  insulin NPH-regular Human (70-30) 100 UNIT/ML injection Inject 30 Units into the skin daily.    [provider]  Insulin Pen Needle (COMFORT EZ PEN NEEDLES) 32G X 6 MM MISC 1 application by Does not apply route at bedtime. 09/24/17   Love, Ivan Anchors, PA-C  losartan (COZAAR) 25 MG tablet Take 1 tablet (25 mg total) by mouth daily. 09/27/18   Bensimhon, Shaune Pascal, MD  magnesium oxide (MAG-OX) 400 (241.3 Mg) MG tablet Take 1 tablet (400 mg total) by mouth 2 (two) times daily. 09/24/17   Love, Ivan Anchors, PA-C  Multiple Vitamin (MULTIVITAMIN WITH MINERALS) TABS tablet Take 1 tablet by mouth daily. 09/25/17   Love, Ivan Anchors, PA-C  pantoprazole (PROTONIX) 40 MG tablet Take 1 tablet (40 mg total) by mouth daily. 02/22/19   Darrick Grinder D, NP  torsemide (DEMADEX) 20 MG tablet Take 1 tablet (20 mg total) by mouth every Monday, Wednesday, and Friday. 02/09/19   Bensimhon, Shaune Pascal, MD  warfarin (COUMADIN) 2.5 MG tablet Take on tablet daily or as directed Patient taking differently: Take 2.5 mg (1 tab) daily or as  directed 10/06/18   Bensimhon, Shaune Pascal, MD    Allergies    Penicillins and Alcohol-sulfur [sulfur]  Review of Systems   Review of Systems  Constitutional: Positive for fatigue. Negative for diaphoresis and fever.  HENT: Negative for sore throat.   Eyes: Negative for visual  disturbance.  Respiratory: Positive for cough and shortness of breath. Negative for hemoptysis.   Cardiovascular: Positive for leg swelling and syncope. Negative for chest pain.  Gastrointestinal: Negative for abdominal pain and vomiting.  Genitourinary: Negative for dysuria.  Musculoskeletal: Negative for myalgias.  Skin: Negative for rash.  Neurological: Negative for headaches.    Physical Exam Updated Vital Signs BP (!) 167/96    Pulse 60    Resp (!) 28    SpO2 100%   Physical Exam Vitals and nursing note reviewed.  Constitutional:      Appearance: He is well-developed.  HENT:     Head: Normocephalic and atraumatic.  Eyes:     Conjunctiva/sclera: Conjunctivae normal.  Cardiovascular:     Pulses: Normal pulses.     Heart sounds: No murmur.     Comments: LVAD hum.  Normal rate on monitor. Pulmonary:     Effort: Tachypnea present. No respiratory distress.     Breath sounds: Normal breath sounds.  Abdominal:     Palpations: Abdomen is soft.     Tenderness: There is no abdominal tenderness. There is no guarding.  Musculoskeletal:     Cervical back: Neck supple.     Right lower leg: Edema present.     Left lower leg: Edema present.  Skin:    General: Skin is warm and dry.     Capillary Refill: Capillary refill takes less than 2 seconds.  Neurological:     General: No focal deficit present.     Mental Status: He is alert. Mental status is at baseline.     ED Results / Procedures / Treatments   Labs (all labs ordered are listed, but only abnormal results are displayed) Labs Reviewed  COMPREHENSIVE METABOLIC PANEL - Abnormal; Notable for the following components:      Result Value   Sodium 130  (*)    Chloride 97 (*)    Glucose, Bld 274 (*)    BUN 32 (*)    Creatinine, Ser 2.00 (*)    Calcium 8.4 (*)    Total Protein 8.6 (*)    Albumin 2.5 (*)    AST 47 (*)    GFR calc non Af Amer 33 (*)    GFR calc Af Amer 39 (*)    All other components within normal limits  CBC WITH DIFFERENTIAL/PLATELET - Abnormal; Notable for the following components:   RBC 3.83 (*)    Hemoglobin 10.3 (*)    HCT 33.0 (*)    RDW 17.8 (*)    Lymphs Abs 0.5 (*)    All other components within normal limits  BRAIN NATRIURETIC PEPTIDE - Abnormal; Notable for the following components:   B Natriuretic Peptide 439.6 (*)    All other components within normal limits  PROTIME-INR - Abnormal; Notable for the following components:   Prothrombin Time 25.6 (*)    INR 2.3 (*)    All other components within normal limits  LACTATE DEHYDROGENASE - Abnormal; Notable for the following components:   LDH 323 (*)    All other components within normal limits  SARS CORONAVIRUS 2 (TAT 6-24 HRS)  POC SARS CORONAVIRUS 2 AG -  ED  TROPONIN I (HIGH SENSITIVITY)  TROPONIN I (HIGH SENSITIVITY)    EKG EKG Interpretation  Date/Time:  Sunday February 26 2019 13:11:42 EST Ventricular Rate:  108 PR Interval:    QRS Duration: 153 QT Interval:  380 QTC Calculation: 419 R Axis:   -110 Text Interpretation: likely sinus vs  paced electrical  interferemce Confirmed by Aletta Edouard [phone]) on 02/26/2019 1:19:37 PM   Radiology DG Chest Port 1 View  Result Date: 02/26/2019 CLINICAL DATA:  Increased weakness, cough, fluid retention, syncopal episode for 3 minutes, history of LVAD, ischemic cardiomyopathy, coronary artery disease, CHF, atrial flutter, type II diabetes mellitus, hypertension EXAM: PORTABLE CHEST 1 VIEW COMPARISON:  Portable exam 1332 hours compared to 09/07/2017 FINDINGS: LVAD projects over cardiac apex. LEFT subclavian AICD with leads projecting over RIGHT atrium, RIGHT ventricle, and coronary sinus. Post median  sternotomy. Enlargement of cardiac silhouette. Diffuse BILATERAL pulmonary infiltrates question pulmonary edema though multifocal pneumonia. No pleural effusion or pneumothorax. IMPRESSION: Enlargement of cardiac silhouette post LVAD and AICD. Diffuse BILATERAL pulmonary infiltrates favor pulmonary edema though multifocal pneumonia not excluded. Electronically Signed   By: Lavonia Dana M.D.   On: 02/26/2019 13:41    Procedures .Critical Care Performed by: Hayden Rasmussen, MD Authorized by: Hayden Rasmussen, MD   Critical care provider statement:    Critical care time (minutes):  30   Critical care time was exclusive of:  Separately billable procedures and treating other patients   Critical care was necessary to treat or prevent imminent or life-threatening deterioration of the following conditions:  Cardiac failure   Critical care was time spent personally by me on the following activities:  Discussions with consultants, evaluation of patient's response to treatment, examination of patient, ordering and performing treatments and interventions, ordering and review of laboratory studies, ordering and review of radiographic studies, pulse oximetry, re-evaluation of patient's condition, obtaining history from patient or surrogate, review of old charts and development of treatment plan with patient or surrogate   I assumed direction of critical care for this patient from another provider in my specialty: no     (including critical care time)  Medications Ordered in ED Medications  furosemide (LASIX) injection 80 mg (80 mg Intravenous Given 02/26/19 1510)    ED Course  I have reviewed the triage vital signs and the nursing notes.  Pertinent labs & imaging results that were available during my care of the patient were reviewed by me and considered in my medical decision making (see chart for details).  Clinical Course as of Feb 26 1628  Sun Feb 26, 4363  5028 68 year old male with history of  cardiac disease CHF and currently an active LVAD here with days 2 weeks of cough and generalized weakness with a syncopal event today.  Differential includes Covid, pneumonia, CHF, arrhythmia, anemia, metabolic derangement   [MB]  1344 I informed LVAD coordinator that the patient is at [facility].   [MB]  [id number] Patient states his weight is up about 8 pounds from baseline.   [MB]  [id number] Chest x-ray interpreted by me as AICD and LVAD in place, cardiomegaly and bilateral edema versus infiltrates.   [MB]  [id number] Discussed with Dr. [name] from cardiology.  He is recommending giving the patient 80 of Lasix and checking back with him in about an hour or so to see what kind of diuresis he has and if any improvement in his symptoms.   [MB]    Clinical Course User Index [MB] Hayden Rasmussen, MD   MDM Rules/Calculators/A&P                     [name] [name] was evaluated in Emergency Department on 02/26/2019 for the symptoms described in the history of present illness. He was evaluated in the context of the global  COVID-19 pandemic, which necessitated consideration that the patient might be at risk for infection with the SARS-CoV-2 virus that causes COVID-19. Institutional protocols and algorithms that pertain to the evaluation of patients at risk for COVID-19 are in a state of rapid change based on information released by regulatory bodies including the CDC and federal and state organizations. These policies and algorithms were followed during the patient's care in the ED.  Final Clinical Impression(s) / ED Diagnoses Final diagnoses:  Acute on chronic combined systolic and diastolic CHF (congestive heart failure) ([place])  LVAD (left ventricular assist device) present [facility])    Rx / DC Orders ED Discharge Orders    None       Hayden Rasmussen, MD 02/26/19 1633    Hayden Rasmussen, MD 03/06/19 1105

## 2019-02-26 NOTE — ED Notes (Signed)
Doppler - BP [id number] systolically

## 2019-02-26 NOTE — Telephone Encounter (Signed)
Called pt/wife and instructed pt to start Prednisone 60 mg for 7 days and then drop to 40 mg daily per Dr. [name]. This is due to sed rate elevation of 80. 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 Prednisone sent to [facility]. Pt/wife also instructed to keep a close monitor on the pts BS. Wife states that the PCP who was prescribing pts insulin has left the practice and they are in between doctors. We will need to get the pt in w/PCP this coming week to aid [place] in controlling the pts BS while the pt is taking Prednisone. Pt and wife verbalized understanding of all instructions and will start the Prednisone in the morning.  Tanda Rockers RN, BSN VAD Coordinator 24/7 Pager [phone]

## 2019-02-26 NOTE — ED Triage Notes (Signed)
Per EMS: Pt here for increased weakness, cough, fluid retention, and syncopal episode that last approx 3 minutes. No injury noted with syncopal episode. Pt is a LVAD pt.  EMS administered 150 NS PTA.    EMS vitals: Pulse 80 RR 22 CBG 310 Temp 97.4 Sp02 100% RA

## 2019-02-27 ENCOUNTER — Ambulatory Visit (HOSPITAL_COMMUNITY): Payer: Self-pay | Admitting: Pharmacist

## 2019-02-27 ENCOUNTER — Other Ambulatory Visit (HOSPITAL_COMMUNITY): Payer: Self-pay | Admitting: *Deleted

## 2019-02-27 DIAGNOSIS — Z7901 Long term (current) use of anticoagulants: Secondary | ICD-10-CM

## 2019-02-27 DIAGNOSIS — Z95811 Presence of heart assist device: Secondary | ICD-10-CM

## 2019-02-27 NOTE — Progress Notes (Signed)
LVAD INR 

## 2019-02-28 ENCOUNTER — Encounter (HOSPITAL_COMMUNITY): Payer: Self-pay

## 2019-02-28 ENCOUNTER — Inpatient Hospital Stay (HOSPITAL_COMMUNITY)
Admission: AD | Admit: 2019-02-28 | Discharge: 2019-04-03 | DRG: 207 | Disposition: E | Payer: PPO | Source: Ambulatory Visit | Attending: Cardiology | Admitting: Cardiology

## 2019-02-28 ENCOUNTER — Ambulatory Visit (HOSPITAL_COMMUNITY)
Admission: RE | Admit: 2019-02-28 | Discharge: 2019-02-28 | Disposition: A | Discharge status: Home / Self Care | Payer: PPO | Source: Ambulatory Visit | Attending: Cardiology | Admitting: Cardiology

## 2019-02-28 ENCOUNTER — Other Ambulatory Visit: Payer: Self-pay

## 2019-02-28 ENCOUNTER — Inpatient Hospital Stay (HOSPITAL_COMMUNITY): Payer: PPO

## 2019-02-28 VITALS — BP 121/77 | HR 70 | Temp 97.7°F | Ht 70.0 in | Wt 241.2 lb

## 2019-02-28 DIAGNOSIS — A419 Sepsis, unspecified organism: Secondary | ICD-10-CM | POA: Diagnosis not present

## 2019-02-28 DIAGNOSIS — Z6835 Body mass index (BMI) 35.0-35.9, adult: Secondary | ICD-10-CM | POA: Diagnosis not present

## 2019-02-28 DIAGNOSIS — R6521 Severe sepsis with septic shock: Secondary | ICD-10-CM | POA: Diagnosis not present

## 2019-02-28 DIAGNOSIS — Z95811 Presence of heart assist device: Secondary | ICD-10-CM | POA: Diagnosis not present

## 2019-02-28 DIAGNOSIS — I2510 Atherosclerotic heart disease of native coronary artery without angina pectoris: Secondary | ICD-10-CM | POA: Diagnosis present

## 2019-02-28 DIAGNOSIS — R0602 Shortness of breath: Secondary | ICD-10-CM

## 2019-02-28 DIAGNOSIS — I472 Ventricular tachycardia: Secondary | ICD-10-CM | POA: Diagnosis present

## 2019-02-28 DIAGNOSIS — Z95828 Presence of other vascular implants and grafts: Secondary | ICD-10-CM

## 2019-02-28 DIAGNOSIS — Z95810 Presence of automatic (implantable) cardiac defibrillator: Secondary | ICD-10-CM | POA: Diagnosis not present

## 2019-02-28 DIAGNOSIS — Z20822 Contact with and (suspected) exposure to covid-19: Secondary | ICD-10-CM | POA: Diagnosis present

## 2019-02-28 DIAGNOSIS — J9601 Acute respiratory failure with hypoxia: Secondary | ICD-10-CM | POA: Diagnosis not present

## 2019-02-28 DIAGNOSIS — K7290 Hepatic failure, unspecified without coma: Secondary | ICD-10-CM | POA: Diagnosis not present

## 2019-02-28 DIAGNOSIS — I4821 Permanent atrial fibrillation: Secondary | ICD-10-CM | POA: Diagnosis present

## 2019-02-28 DIAGNOSIS — Y95 Nosocomial condition: Secondary | ICD-10-CM | POA: Diagnosis not present

## 2019-02-28 DIAGNOSIS — R570 Cardiogenic shock: Secondary | ICD-10-CM | POA: Diagnosis not present

## 2019-02-28 DIAGNOSIS — Z7982 Long term (current) use of aspirin: Secondary | ICD-10-CM

## 2019-02-28 DIAGNOSIS — J9621 Acute and chronic respiratory failure with hypoxia: Secondary | ICD-10-CM | POA: Diagnosis present

## 2019-02-28 DIAGNOSIS — N170 Acute kidney failure with tubular necrosis: Secondary | ICD-10-CM | POA: Diagnosis not present

## 2019-02-28 DIAGNOSIS — Z79899 Other long term (current) drug therapy: Secondary | ICD-10-CM

## 2019-02-28 DIAGNOSIS — E43 Unspecified severe protein-calorie malnutrition: Secondary | ICD-10-CM | POA: Diagnosis not present

## 2019-02-28 DIAGNOSIS — E7800 Pure hypercholesterolemia, unspecified: Secondary | ICD-10-CM | POA: Diagnosis present

## 2019-02-28 DIAGNOSIS — Z952 Presence of prosthetic heart valve: Secondary | ICD-10-CM

## 2019-02-28 DIAGNOSIS — Z978 Presence of other specified devices: Secondary | ICD-10-CM

## 2019-02-28 DIAGNOSIS — R0603 Acute respiratory distress: Secondary | ICD-10-CM

## 2019-02-28 DIAGNOSIS — Z515 Encounter for palliative care: Secondary | ICD-10-CM | POA: Diagnosis not present

## 2019-02-28 DIAGNOSIS — E785 Hyperlipidemia, unspecified: Secondary | ICD-10-CM | POA: Diagnosis present

## 2019-02-28 DIAGNOSIS — J9600 Acute respiratory failure, unspecified whether with hypoxia or hypercapnia: Secondary | ICD-10-CM

## 2019-02-28 DIAGNOSIS — I361 Nonrheumatic tricuspid (valve) insufficiency: Secondary | ICD-10-CM | POA: Diagnosis not present

## 2019-02-28 DIAGNOSIS — R040 Epistaxis: Secondary | ICD-10-CM | POA: Diagnosis not present

## 2019-02-28 DIAGNOSIS — J189 Pneumonia, unspecified organism: Secondary | ICD-10-CM | POA: Diagnosis not present

## 2019-02-28 DIAGNOSIS — I255 Ischemic cardiomyopathy: Secondary | ICD-10-CM | POA: Diagnosis present

## 2019-02-28 DIAGNOSIS — J849 Interstitial pulmonary disease, unspecified: Secondary | ICD-10-CM | POA: Diagnosis not present

## 2019-02-28 DIAGNOSIS — E870 Hyperosmolality and hypernatremia: Secondary | ICD-10-CM | POA: Diagnosis not present

## 2019-02-28 DIAGNOSIS — E1122 Type 2 diabetes mellitus with diabetic chronic kidney disease: Secondary | ICD-10-CM | POA: Diagnosis present

## 2019-02-28 DIAGNOSIS — I130 Hypertensive heart and chronic kidney disease with heart failure and stage 1 through stage 4 chronic kidney disease, or unspecified chronic kidney disease: Secondary | ICD-10-CM | POA: Diagnosis present

## 2019-02-28 DIAGNOSIS — E875 Hyperkalemia: Secondary | ICD-10-CM | POA: Diagnosis present

## 2019-02-28 DIAGNOSIS — I252 Old myocardial infarction: Secondary | ICD-10-CM

## 2019-02-28 DIAGNOSIS — T889XXA Complication of surgical and medical care, unspecified, initial encounter: Secondary | ICD-10-CM

## 2019-02-28 DIAGNOSIS — Z9981 Dependence on supplemental oxygen: Secondary | ICD-10-CM | POA: Diagnosis not present

## 2019-02-28 DIAGNOSIS — Z7901 Long term (current) use of anticoagulants: Secondary | ICD-10-CM

## 2019-02-28 DIAGNOSIS — E669 Obesity, unspecified: Secondary | ICD-10-CM | POA: Diagnosis present

## 2019-02-28 DIAGNOSIS — R7989 Other specified abnormal findings of blood chemistry: Secondary | ICD-10-CM

## 2019-02-28 DIAGNOSIS — Z8249 Family history of ischemic heart disease and other diseases of the circulatory system: Secondary | ICD-10-CM

## 2019-02-28 DIAGNOSIS — E1142 Type 2 diabetes mellitus with diabetic polyneuropathy: Secondary | ICD-10-CM | POA: Diagnosis present

## 2019-02-28 DIAGNOSIS — R001 Bradycardia, unspecified: Secondary | ICD-10-CM | POA: Diagnosis not present

## 2019-02-28 DIAGNOSIS — Z833 Family history of diabetes mellitus: Secondary | ICD-10-CM

## 2019-02-28 DIAGNOSIS — R0902 Hypoxemia: Secondary | ICD-10-CM

## 2019-02-28 DIAGNOSIS — I480 Paroxysmal atrial fibrillation: Secondary | ICD-10-CM | POA: Diagnosis present

## 2019-02-28 DIAGNOSIS — J9602 Acute respiratory failure with hypercapnia: Secondary | ICD-10-CM | POA: Diagnosis not present

## 2019-02-28 DIAGNOSIS — I5023 Acute on chronic systolic (congestive) heart failure: Secondary | ICD-10-CM | POA: Diagnosis present

## 2019-02-28 DIAGNOSIS — J984 Other disorders of lung: Secondary | ICD-10-CM | POA: Diagnosis not present

## 2019-02-28 DIAGNOSIS — I5022 Chronic systolic (congestive) heart failure: Secondary | ICD-10-CM

## 2019-02-28 DIAGNOSIS — J702 Acute drug-induced interstitial lung disorders: Secondary | ICD-10-CM | POA: Diagnosis present

## 2019-02-28 DIAGNOSIS — N1832 Chronic kidney disease, stage 3b: Secondary | ICD-10-CM | POA: Diagnosis present

## 2019-02-28 DIAGNOSIS — E1165 Type 2 diabetes mellitus with hyperglycemia: Secondary | ICD-10-CM | POA: Diagnosis not present

## 2019-02-28 DIAGNOSIS — Z955 Presence of coronary angioplasty implant and graft: Secondary | ICD-10-CM

## 2019-02-28 DIAGNOSIS — E11319 Type 2 diabetes mellitus with unspecified diabetic retinopathy without macular edema: Secondary | ICD-10-CM | POA: Diagnosis present

## 2019-02-28 DIAGNOSIS — Z880 Allergy status to penicillin: Secondary | ICD-10-CM

## 2019-02-28 DIAGNOSIS — T462X5A Adverse effect of other antidysrhythmic drugs, initial encounter: Secondary | ICD-10-CM | POA: Diagnosis present

## 2019-02-28 DIAGNOSIS — J9690 Respiratory failure, unspecified, unspecified whether with hypoxia or hypercapnia: Secondary | ICD-10-CM | POA: Diagnosis present

## 2019-02-28 DIAGNOSIS — Z01818 Encounter for other preprocedural examination: Secondary | ICD-10-CM

## 2019-02-28 DIAGNOSIS — N179 Acute kidney failure, unspecified: Secondary | ICD-10-CM | POA: Diagnosis not present

## 2019-02-28 DIAGNOSIS — Z87891 Personal history of nicotine dependence: Secondary | ICD-10-CM

## 2019-02-28 LAB — BLOOD GAS, ARTERIAL
Acid-base deficit: 1.6 mmol/L (ref 0.0–2.0)
Bicarbonate: 21.7 mmol/L (ref 20.0–28.0)
Drawn by: 398991
FIO2: 100
O2 Saturation: 100 %
Patient temperature: 37
pCO2 arterial: 30.8 mmHg — ABNORMAL LOW (ref 32.0–48.0)
pH, Arterial: 7.462 — ABNORMAL HIGH (ref 7.350–7.450)
pO2, Arterial: 272 mmHg — ABNORMAL HIGH (ref 83.0–108.0)

## 2019-02-28 LAB — COMPREHENSIVE METABOLIC PANEL
ALT: 121 U/L — ABNORMAL HIGH (ref 0–44)
AST: 147 U/L — ABNORMAL HIGH (ref 15–41)
AST: 161 U/L — ABNORMAL HIGH (ref 15–41)
Albumin: 2.3 g/dL — ABNORMAL LOW (ref 3.5–5.0)
Albumin: 2.5 g/dL — ABNORMAL LOW (ref 3.5–5.0)
Alkaline Phosphatase: 88 U/L (ref 38–126)
Alkaline Phosphatase: 92 U/L (ref 38–126)
Anion gap: 15 (ref 5–15)
Anion gap: 19 — ABNORMAL HIGH (ref 5–15)
BUN: 47 mg/dL — ABNORMAL HIGH (ref 8–23)
BUN: 48 mg/dL — ABNORMAL HIGH (ref 8–23)
CO2: 22 mmol/L (ref 22–32)
CO2: 17 mmol/L — ABNORMAL LOW (ref 22–32)
Calcium: 8.5 mg/dL — ABNORMAL LOW (ref 8.9–10.3)
Calcium: 8.8 mg/dL — ABNORMAL LOW (ref 8.9–10.3)
Chloride: 94 mmol/L — ABNORMAL LOW (ref 98–111)
Chloride: 95 mmol/L — ABNORMAL LOW (ref 98–111)
Creatinine, Ser: 2.34 mg/dL — ABNORMAL HIGH (ref 0.61–1.24)
Creatinine, Ser: 2.52 mg/dL — ABNORMAL HIGH (ref 0.61–1.24)
GFR calc Af Amer: 32 mL/min — ABNORMAL LOW (ref >60)
GFR calc Af Amer: 29 mL/min — ABNORMAL LOW (ref >60)
GFR calc non Af Amer: 28 mL/min — ABNORMAL LOW (ref >60)
GFR calc non Af Amer: 25 mL/min — ABNORMAL LOW (ref >60)
Glucose, Bld: 263 mg/dL — ABNORMAL HIGH (ref 70–99)
Glucose, Bld: 264 mg/dL — ABNORMAL HIGH (ref 70–99)
Potassium: 4.1 mmol/L (ref 3.5–5.1)
Potassium: 4.1 mmol/L (ref 3.5–5.1)
Sodium: 131 mmol/L — ABNORMAL LOW (ref 135–145)
Sodium: 131 mmol/L — ABNORMAL LOW (ref 135–145)
Total Bilirubin: 1.4 mg/dL — ABNORMAL HIGH (ref 0.3–1.2)
Total Bilirubin: 1.9 mg/dL — ABNORMAL HIGH (ref 0.3–1.2)
Total Protein: 8.2 g/dL — ABNORMAL HIGH (ref 6.5–8.1)
Total Protein: 8.9 g/dL — ABNORMAL HIGH (ref 6.5–8.1)

## 2019-02-28 LAB — SEDIMENTATION RATE: Sed Rate: 68 mm/hr — ABNORMAL HIGH (ref 0–16)

## 2019-02-28 LAB — PROCALCITONIN
Procalcitonin: 0.67 ng/mL
Procalcitonin: 0.7 ng/mL

## 2019-02-28 LAB — PROTIME-INR
INR: 3 — ABNORMAL HIGH (ref 0.8–1.2)
INR: 2.9 — ABNORMAL HIGH (ref 0.8–1.2)
Prothrombin Time: 30.9 seconds — ABNORMAL HIGH (ref 11.4–15.2)
Prothrombin Time: 30.1 seconds — ABNORMAL HIGH (ref 11.4–15.2)

## 2019-02-28 LAB — HEMOGLOBIN A1C
Hgb A1c MFr Bld: 9.4 % — ABNORMAL HIGH (ref 4.8–5.6)
Mean Plasma Glucose: 223.08 mg/dL

## 2019-02-28 LAB — CBC
HCT: 36 % — ABNORMAL LOW (ref 39.0–52.0)
HCT: 31.2 % — ABNORMAL LOW (ref 39.0–52.0)
Hemoglobin: 11 g/dL — ABNORMAL LOW (ref 13.0–17.0)
Hemoglobin: 10.2 g/dL — ABNORMAL LOW (ref 13.0–17.0)
MCH: 26.4 pg (ref 26.0–34.0)
MCH: 26.8 pg (ref 26.0–34.0)
MCHC: 30.6 g/dL (ref 30.0–36.0)
MCHC: 32.7 g/dL (ref 30.0–36.0)
MCV: 86.5 fL (ref 80.0–100.0)
MCV: 81.9 fL (ref 80.0–100.0)
Platelets: 308 10*3/uL (ref 150–400)
Platelets: 274 10*3/uL (ref 150–400)
RBC: 4.16 MIL/uL — ABNORMAL LOW (ref 4.22–5.81)
RBC: 3.81 MIL/uL — ABNORMAL LOW (ref 4.22–5.81)
RDW: 18 % — ABNORMAL HIGH (ref 11.5–15.5)
RDW: 17.6 % — ABNORMAL HIGH (ref 11.5–15.5)
WBC: 10 10*3/uL (ref 4.0–10.5)
WBC: 10.3 10*3/uL (ref 4.0–10.5)
nRBC: 0.2 % (ref 0.0–0.2)
nRBC: 0 % (ref 0.0–0.2)

## 2019-02-28 LAB — LACTATE DEHYDROGENASE
LDH: 471 U/L — ABNORMAL HIGH (ref 98–192)
LDH: 475 U/L — ABNORMAL HIGH (ref 98–192)

## 2019-02-28 LAB — ECHOCARDIOGRAM LIMITED
Height: 70 in
Weight: 3859.2 oz

## 2019-02-28 LAB — GLUCOSE, CAPILLARY
Glucose-Capillary: 242 mg/dL — ABNORMAL HIGH (ref 70–99)
Glucose-Capillary: 191 mg/dL — ABNORMAL HIGH (ref 70–99)
Glucose-Capillary: 225 mg/dL — ABNORMAL HIGH (ref 70–99)

## 2019-02-28 LAB — HIV ANTIBODY (ROUTINE TESTING W REFLEX): HIV Screen 4th Generation wRfx: NONREACTIVE

## 2019-02-28 LAB — MRSA PCR SCREENING: MRSA by PCR: NEGATIVE

## 2019-02-28 LAB — SARS CORONAVIRUS 2 (TAT 6-24 HRS): SARS Coronavirus 2: NEGATIVE

## 2019-02-28 LAB — BRAIN NATRIURETIC PEPTIDE: B Natriuretic Peptide: 440.2 pg/mL — ABNORMAL HIGH (ref 0.0–100.0)

## 2019-02-28 LAB — C-REACTIVE PROTEIN: CRP: 23.5 mg/dL — ABNORMAL HIGH (ref <1.0)

## 2019-02-28 MED ORDER — HYDRALAZINE HCL 25 MG PO TABS
25.0000 mg | ORAL_TABLET | Freq: Three times a day (TID) | ORAL | Status: DC
  Administered 2019-02-28 – 2019-03-02 (×5): 25 mg via ORAL
  Filled 2019-02-28 (×6): qty 1

## 2019-02-28 MED ORDER — ACETAMINOPHEN 500 MG PO TABS: 1000.0000 mg | ORAL_TABLET | Freq: Four times a day (QID) | ORAL | Status: DC | PRN

## 2019-02-28 MED ORDER — METHYLPREDNISOLONE SODIUM SUCC 125 MG IJ SOLR
60.0000 mg | Freq: Four times a day (QID) | INTRAMUSCULAR | Status: DC
  Administered 2019-02-28 – 2019-03-01 (×4): 60 mg via INTRAVENOUS
  Filled 2019-02-28 (×4): qty 2

## 2019-02-28 MED ORDER — FUROSEMIDE 10 MG/ML IJ SOLN
60.0000 mg | Freq: Once | INTRAMUSCULAR | Status: AC
  Administered 2019-02-28: 15:00:00 60 mg via INTRAVENOUS
  Filled 2019-02-28: qty 6

## 2019-02-28 MED ORDER — TORSEMIDE 20 MG PO TABS: 20.0000 mg | ORAL_TABLET | ORAL | Status: DC

## 2019-02-28 MED ORDER — ONDANSETRON HCL 4 MG/2ML IJ SOLN
4.0000 mg | Freq: Four times a day (QID) | INTRAMUSCULAR | Status: DC | PRN
  Filled 2019-02-28: qty 2

## 2019-02-28 MED ORDER — WARFARIN - PHARMACIST DOSING INPATIENT
Freq: Every day | Status: DC
  Administered 2019-03-05: 1

## 2019-02-28 MED ORDER — ACETAMINOPHEN 325 MG PO TABS: 650.0000 mg | ORAL_TABLET | ORAL | Status: DC | PRN

## 2019-02-28 MED ORDER — GABAPENTIN 300 MG PO CAPS
600.0000 mg | ORAL_CAPSULE | Freq: Two times a day (BID) | ORAL | Status: DC
  Administered 2019-02-28 – 2019-03-12 (×24): 600 mg via ORAL
  Filled 2019-02-28 (×25): qty 2

## 2019-02-28 MED ORDER — PANTOPRAZOLE SODIUM 40 MG PO TBEC
40.0000 mg | DELAYED_RELEASE_TABLET | Freq: Every day | ORAL | Status: DC
  Administered 2019-03-01 – 2019-03-12 (×12): 40 mg via ORAL
  Filled 2019-02-28 (×12): qty 1

## 2019-02-28 MED ORDER — MAGNESIUM OXIDE 400 (241.3 MG) MG PO TABS
400.0000 mg | ORAL_TABLET | Freq: Two times a day (BID) | ORAL | Status: DC
  Administered 2019-02-28 – 2019-03-09 (×18): 400 mg via ORAL
  Filled 2019-02-28 (×18): qty 1

## 2019-02-28 MED ORDER — INSULIN ASPART 100 UNIT/ML ~~LOC~~ SOLN
0.0000 [IU] | Freq: Three times a day (TID) | SUBCUTANEOUS | Status: DC
  Administered 2019-02-28: 2 [IU] via SUBCUTANEOUS
  Administered 2019-02-28: 12:00:00 4 [IU] via SUBCUTANEOUS
  Administered 2019-02-28: 21:00:00 3 [IU] via SUBCUTANEOUS
  Administered 2019-03-01: 16:00:00 7 [IU] via SUBCUTANEOUS
  Administered 2019-03-01: 5 [IU] via SUBCUTANEOUS
  Administered 2019-03-01: 7 [IU] via SUBCUTANEOUS
  Administered 2019-03-02: 19:00:00 3 [IU] via SUBCUTANEOUS
  Administered 2019-03-02: 07:00:00 2 [IU] via SUBCUTANEOUS
  Administered 2019-03-02 (×2): 3 [IU] via SUBCUTANEOUS
  Administered 2019-03-03 – 2019-03-04 (×3): 2 [IU] via SUBCUTANEOUS
  Administered 2019-03-04: 9 [IU] via SUBCUTANEOUS
  Administered 2019-03-04: 5 [IU] via SUBCUTANEOUS
  Administered 2019-03-05: 9 [IU] via SUBCUTANEOUS
  Administered 2019-03-05: 7 [IU] via SUBCUTANEOUS
  Administered 2019-03-06: 07:00:00 3 [IU] via SUBCUTANEOUS

## 2019-02-28 MED ORDER — ASPIRIN 81 MG PO CHEW
81.0000 mg | CHEWABLE_TABLET | Freq: Every day | ORAL | Status: DC
  Administered 2019-03-01 – 2019-03-12 (×12): 81 mg via ORAL
  Filled 2019-02-28 (×12): qty 1

## 2019-02-28 MED ORDER — WARFARIN 0.5 MG HALF TABLET
0.5000 mg | ORAL_TABLET | Freq: Once | ORAL | Status: AC
  Administered 2019-02-28: 0.5 mg via ORAL
  Filled 2019-02-28: qty 1

## 2019-02-28 MED ORDER — LOSARTAN POTASSIUM 25 MG PO TABS: 25.0000 mg | ORAL_TABLET | Freq: Every day | ORAL | Status: DC

## 2019-02-28 NOTE — Progress Notes (Signed)
*  PRELIMINARY RESULTS* Echocardiogram 2D Echocardiogram LIMITED has been performed.  [name] [name] 02/19/2019, 2:44 PM

## 2019-02-28 NOTE — Progress Notes (Signed)
ANTICOAGULATION CONSULT NOTE - Initial Consult  Pharmacy Consult for warfarin Indication: atrial fibrillation/LVAD  Allergies  Allergen Reactions  . Penicillins Hives, Itching and Other (See Comments)    Did it involve swelling of the face/tongue/throat, SOB, or low BP? No Did it involve sudden or severe rash/hives, skin peeling, or any reaction on the inside of your mouth or nose? No Did you need to seek medical attention at a hospital or doctor's office? Unknown When did it last happen?childhood allergy If all above answers are "NO", may proceed with cephalosporin use.    Marland Kitchen Alcohol-Sulfur [Sulfur] Other (See Comments)    Burns skin    Patient Measurements:    Vital Signs: Temp: 97.7 F (36.5 C) (12/29 1027) BP: 121/77 (12/29 1027) Pulse Rate: 70 (12/29 1027)  Labs: Recent Labs    02/26/19 1349 02/26/19 1500 02/07/2019 0955  HGB 10.3*  --  11.0*  HCT 33.0*  --  36.0*  PLT 236  --  308  LABPROT 25.6*  --  30.9*  INR 2.3*  --  3.0*  CREATININE 2.00*  --   --   TROPONINIHS 5 6  --     Estimated Creatinine Clearance: 43.8 mL/min (A) (by C-G formula based on SCr of 2 mg/dL (H)).   Medical History: Past Medical History:  Diagnosis Date  . AICD (automatic cardioverter/defibrillator) present 02/05/2014   Upgrade to Medtronic biventricular ICD, serial number  [id number]   . Atrial flutter ([place]) 04/2012   s/p TEE-EPS+RFCA 04/2012  . CAD (coronary artery disease) [id number] X 2    RCA-T, 70% PL (off CFX), 99% Prox LAD/90% Dist LAD, S/P TAXUS stent x 2  . CHF (congestive heart failure) ([place])   . Chronic anticoagulation   . Chronic systolic heart failure ([place])   . CKD (chronic kidney disease)   . Diabetic retinopathy ([place])   . DM type 2 (diabetes mellitus, type 2) (HCC)    insulin dependent  . HTN (hypertension)   . Hypercholesteremia    ablation  . ICD (implantable cardiac defibrillator) in place   . Ischemic cardiomyopathy March 2015   20-25% 2D   .  Nephrolithiasis   . 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 Amiodarone discontinued, INR 3 on admit, CBC stable, LDH up. Pt has not taken warfarin today.  *PTA Warfarin dose= 2.5mg  daily  Goal of Therapy:  INR 2.0-2.5 Monitor platelets by anticoagulation protocol: Yes   Plan:  -Warfarin 0.5 x1 tonight - giving small dose given supratherapeutic INR -Daily INR   Arrie Senate, PharmD, BCPS Clinical Pharmacist [phone] Please check AMION for all [facility] numbers 02/06/2019

## 2019-02-28 NOTE — H&P (Addendum)
Advanced Heart Failure Team History and Physical Note   PCP:  [name], NP  PCP-Cardiology: Dr. 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 male with h/o chronic systolic CHF due to ICM, s/p LVAD HM3 08/16/2017, [name], [name], [name], [name], [name], HTN, HL, and CKD III.  Admitted 08/11/17 with recurrent A/C systolic CHF. Milrinone added, but mixed venous saturation remained low. Seen by CT surgery/VAD team and deemed appropriate for HMIII under DT criteria. He underwent HMIII on 08/16/17. Chest was unable to be closed due to high intrathoracic pressure and RV failure. He returned to the OR on 6/18 for evacuation of hematoma. Chest was later closed on 6/20. Post operative course complicated by open chest, RV failure, recurrent VT, respiratory failure, and dysphagia.  Seen on 12/10/17 for VT at 180s. ATP attempted x 4, and then shocked with return to NSR. Started amio 200 bid.  Ramp echo 2/20: - LVEF 20-25%. Good cannula position. LV has shrunk in size. RV moderately down. AoV opening on every beat at 5300. Septum shifted to left. Speed decreased to 5100. (5000 looked good as well). RVOT VTI 22  RHC 05/05/18  VAD speed 5100  RA = 5 RV = 44/7 PA = 43/10 (22) PCW = 13 Fick cardiac output/index =4.9/2.3 PVR = 1.9 WU FA sat = 96% PA sat = 68%, 71%  Recently seen in ER 2 days ago on 12/27 for evaluation of cough and dyspnea. Evaluated by Dr. [name]. He had reported 1-2 weeks of increase cough and weakness. Seen in UC 1 week prior to ED visit and COVID negative.  Symptoms progressed over the course of a week. In ER 12/27, MAPs 100-110. Rhythm stable. AF. CXR with diffuse infiltrates. Retested for COVID and negative. WBC ok.  HsTrop 6. BNP 439 (56 in 7/19). Renal function stable. Sats 99-100 on 2L. He was given 80 IV lasix with  ~1L urine output . His ICD interrogation demonstrated No VT/AF. Activity level < 1hr. Optivol way up. (Climbing since September). 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 Amiodarone was discontinued and he was started on prednisone.   Now presents to VAD clinic w/ worsening symptoms. Weak + confused. O2 sats in the 50s on RA. Denies fever and chills. No CP. Placed on 100% NRB w/ improvement. STAT labs, ABG pending and CXR pending.    Review of Systems: [y] = yes, '[ ]'  = no   General: Weight gain '[ ]' ; Weight loss '[ ]' ; Anorexia '[ ]' ; Fatigue '[ ]' ; Fever '[ ]' ; Chills '[ ]' ; Weakness '[ ]'   Cardiac: Chest pain/pressure '[ ]' ; Resting SOB '[ ]' ; Exertional SOB '[ ]' ; Orthopnea '[ ]' ; Pedal Edema '[ ]' ; Palpitations '[ ]' ; Syncope '[ ]' ; Presyncope '[ ]' ; Paroxysmal nocturnal dyspnea'[ ]'   Pulmonary: Cough '[ ]' ; Wheezing'[ ]' ; Hemoptysis'[ ]' ; Sputum '[ ]' ; Snoring '[ ]'   GI: Vomiting'[ ]' ; Dysphagia'[ ]' ; Melena'[ ]' ; Hematochezia '[ ]' ; Heartburn'[ ]' ; Abdominal pain '[ ]' ; Constipation '[ ]' ; Diarrhea '[ ]' ; BRBPR '[ ]'   GU: Hematuria'[ ]' ; Dysuria '[ ]' ; Nocturia'[ ]'   Vascular: Pain in legs with walking '[ ]' ; Pain in feet with lying flat '[ ]' ; Non-healing sores '[ ]' ; Stroke '[ ]' ; TIA '[ ]' ; Slurred speech '[ ]' ;  Neuro: Headaches'[ ]' ; Vertigo'[ ]' ; Seizures'[ ]' ; Paresthesias'[ ]' ;  Blurred vision '[ ]' ; Diplopia '[ ]' ; Vision changes '[ ]'   Ortho/Skin: Arthritis '[ ]' ; Joint pain '[ ]' ; Muscle pain '[ ]' ; Joint swelling '[ ]' ; Back Pain '[ ]' ; Rash '[ ]'   Psych: Depression'[ ]' ; Anxiety'[ ]'   Heme: Bleeding problems '[ ]' ; Clotting disorders '[ ]' ; Anemia '[ ]'   Endocrine: Diabetes '[ ]' ; Thyroid dysfunction'[ ]'    Home Medications Prior to Admission medications   Medication Sig Start Date End Date Taking? Authorizing Provider  acetaminophen (TYLENOL) 500 MG tablet Take 1,000 mg by mouth every 6 (six) hours as needed for moderate pain or headache.     [provider]  ascorbic acid (VITAMIN C) 500 MG tablet Take 1 tablet (500 mg total) by mouth  2 (two) times daily. 09/24/17   Love, Ivan Anchors, PA-C  aspirin 81 MG chewable tablet Chew 1 tablet (81 mg total) by mouth daily. 09/10/17   Clegg, Amy D, NP  gabapentin (NEURONTIN) 300 MG capsule Take 2 capsules (600 mg total) by mouth 2 (two) times daily. 02/09/19   Bensimhon, Shaune Pascal, MD  insulin NPH-regular Human (70-30) 100 UNIT/ML injection Inject 30 Units into the skin daily.    [provider]  Insulin Pen Needle (COMFORT EZ PEN NEEDLES) 32G X 6 MM MISC 1 application by Does not apply route at bedtime. 09/24/17   Love, Ivan Anchors, PA-C  losartan (COZAAR) 25 MG tablet Take 1 tablet (25 mg total) by mouth daily. 09/27/18   Bensimhon, Shaune Pascal, MD  magnesium oxide (MAG-OX) 400 (241.3 Mg) MG tablet Take 1 tablet (400 mg total) by mouth 2 (two) times daily. 09/24/17   Love, Ivan Anchors, PA-C  Multiple Vitamin (MULTIVITAMIN WITH MINERALS) TABS tablet Take 1 tablet by mouth daily. 09/25/17   Love, Ivan Anchors, PA-C  pantoprazole (PROTONIX) 40 MG tablet Take 1 tablet (40 mg total) by mouth daily. 02/22/19   Clegg, Amy D, NP  predniSONE (DELTASONE) 20 MG tablet Take 3 tablets (60 mg total) by mouth daily with breakfast for 7 days, THEN 2 tablets (40 mg total) daily with breakfast. 02/26/19 04/04/19  Bensimhon, Shaune Pascal, MD  torsemide (DEMADEX) 20 MG tablet Take 1 tablet (20 mg total) by mouth every Monday, Wednesday, and Friday. 02/09/19   Bensimhon, Shaune Pascal, MD  triamcinolone (NASACORT ALLERGY 24HR) 55 MCG/ACT AERO nasal inhaler Place 2 sprays into the nose daily.    [provider]  warfarin (COUMADIN) 2.5 MG tablet Take on tablet daily or as directed Patient taking differently: Take 2.5 mg (1 tab) daily or as directed 10/06/18   Bensimhon, Shaune Pascal, MD    Past Medical History: Past Medical History:  Diagnosis Date  . AICD (automatic cardioverter/defibrillator) present 02/05/2014   Upgrade to Medtronic biventricular ICD, serial number  [id number]   . Atrial flutter ([place]) 04/2012   s/p  TEE-EPS+RFCA 04/2012  . CAD (coronary artery disease) 1914,7829 X 2    RCA-T, 70% PL (off CFX), 99% Prox LAD/90% Dist LAD, S/P TAXUS stent x 2  . CHF (congestive heart failure) ([place])   . Chronic anticoagulation   . Chronic systolic heart failure ([place])   . CKD (chronic kidney disease)   . Diabetic retinopathy ([place])   . DM type 2 (diabetes mellitus, type 2) ([facility])    insulin dependent  . HTN (hypertension)   . Hypercholesteremia    ablation  . ICD (implantable cardiac defibrillator) in place   . Ischemic cardiomyopathy March 2015   20-25% 2D   . Nephrolithiasis   .  Ventricular tachycardia [facility])     Past Surgical History: Past Surgical History:  Procedure Laterality Date  . ATRIAL FLUTTER ABLATION N/A 05/19/2012   Procedure: ATRIAL FLUTTER ABLATION;  Surgeon: Thompson Grayer, MD;  Location[facility] CATH LAB;  Service: Cardiovascular;  Laterality: N/A;  . BI-VENTRICULAR IMPLANTABLE CARDIOVERTER DEFIBRILLATOR UPGRADE N/A 02/05/2014   Procedure: BI-VENTRICULAR IMPLANTABLE CARDIOVERTER DEFIBRILLATOR UPGRADE;  Surgeon: Evans Lance, MD;  Location: [facility] CATH LAB;  Service: Cardiovascular;  Laterality: N/A;  . BIV ICD GENERTAOR CHANGE OUT  02/05/2014   Upgrade to Medtronic biventricular ICD, serial number  [id number] by Dr. [name]  . CARDIAC DEFIBRILLATOR PLACEMENT  2007    Medtronic Maximo VR, serial number [id number]  . INSERTION OF IMPLANTABLE LEFT VENTRICULAR ASSIST DEVICE N/A 08/16/2017   Procedure: INSERTION OF IMPLANTABLE LEFT VENTRICULAR ASSIST DEVICE-HM3;  Surgeon: Ivin Poot, MD;  Location: [place];  Service: Open Heart Surgery;  Laterality: N/A;  . IR FLUORO GUIDE CV LINE RIGHT  08/12/2017  . IR US GUIDE VASC ACCESS RIGHT  08/12/2017  . MEDIASTINAL EXPLORATION  08/17/2017   Procedure: MEDIASTINAL REXPLORATION with evacuation of hematoma;  Surgeon: Prescott Gum, Collier Salina, MD;  Location: [facility] OR;  Service: Open Heart Surgery;;  . PERCUTANEOUS CORONARY STENT INTERVENTION (PCI-S)  January 2002    PTCA/Stent Distal RCA  . PERCUTANEOUS CORONARY STENT INTERVENTION (PCI-S)  June 2002   PTCA/Stent x 3 RCA, thrombolysis - failed  . PERCUTANEOUS CORONARY STENT INTERVENTION (PCI-S)  July 2006   TAXUS stents to prox and distal LAD  . RIGHT HEART CATH N/A 07/22/2017   Procedure: RIGHT HEART CATH;  Surgeon: Jolaine Artist, MD;  Location: [place] CV LAB;  Service: Cardiovascular;  Laterality: N/A;  . RIGHT HEART CATH N/A 08/13/2017   Procedure: RIGHT HEART CATH - swan;  Surgeon: Jolaine Artist, MD;  Location: [place] CV LAB;  Service: Cardiovascular;  Laterality: N/A;  . RIGHT HEART CATH N/A 05/05/2018   Procedure: RIGHT HEART CATH;  Surgeon: Jolaine Artist, MD;  Location: [place] CV LAB;  Service: Cardiovascular;  Laterality: N/A;  . RIGHT/LEFT HEART CATH AND CORONARY ANGIOGRAPHY N/A 02/18/2017   Procedure: RIGHT/LEFT HEART CATH AND CORONARY ANGIOGRAPHY;  Surgeon: Jolaine Artist, MD;  Location: [place] CV LAB;  Service: Cardiovascular;  Laterality: N/A;  . STERNAL CLOSURE N/A 08/19/2017   Procedure: STERNAL CLOSURE;  Surgeon: Ivin Poot, MD;  Location: [place];  Service: Thoracic;  Laterality: N/A;  . TEE WITHOUT CARDIOVERSION N/A 08/16/2017   Procedure: TRANSESOPHAGEAL ECHOCARDIOGRAM (TEE);  Surgeon: Prescott Gum, Collier Salina, MD;  Location: [place];  Service: Open Heart Surgery;  Laterality: N/A;  . TEE WITHOUT CARDIOVERSION N/A 08/19/2017   Procedure: TRANSESOPHAGEAL ECHOCARDIOGRAM (TEE);  Surgeon: Prescott Gum, Collier Salina, MD;  Location: [place];  Service: Thoracic;  Laterality: N/A;  . TRICUSPID VALVE REPLACEMENT N/A 08/16/2017   Procedure: TRICUSPID VALVE REPAIR using Oletta Lamas MC3 Ring size 30;  Surgeon: Ivin Poot, MD;  Location: [place];  Service: Open Heart Surgery;  Laterality: N/A;    Family History:  Family History  Problem Relation Age of Onset  . Heart failure Father        Deceased  . Alzheimer's disease Mother        Living  . Heart attack Mother   . CAD  Brother   . Other Brother        blood cancer  . CAD Brother   . Prostate cancer Brother   . CAD Brother   .  CAD Brother   . Healthy Son   . Healthy Daughter   . Diabetes Brother   . Stomach cancer Neg Hx   . Colon cancer Neg Hx     Social History: Social History   Socioeconomic History  . Marital status: Married    Spouse name: Not on file  . Number of children: 4  . Years of education: 69  . Highest education level: Not on file  Occupational History  . Occupation: Psychiatrist - currently unemployed  Tobacco Use  . Smoking status: Former Smoker    Packs/day: 1.00    Years: 29.00    Pack years: 29.00    Types: Cigarettes    Quit date: 03/02/2000    Years since quitting: 19.0  . Smokeless tobacco: Never Used  Substance and Sexual Activity  . Alcohol use: No    Alcohol/week: 0.0 standard drinks  . Drug use: No  . Sexual activity: Yes    Partners: Female    Birth control/protection: None  Other Topics Concern  . Not on file  Social History Narrative   Pt lives with wife in a one story home - married for 5 years. He hunts regularly and is active, walking > 1 mile at times without difficulty.  Has 4 children.     Retired Horticulturist, commercial.  Education: some college.   Social Determinants of Health   Financial Resource Strain:   . Difficulty of Paying Living Expenses: Not on file  Food Insecurity:   . Worried About Charity fundraiser in the Last Year: Not on file  . Ran Out of Food in the Last Year: Not on file  Transportation Needs:   . Lack of Transportation (Medical): Not on file  . Lack of Transportation (Non-Medical): Not on file  Physical Activity:   . Days of Exercise per Week: Not on file  . Minutes of Exercise per Session: Not on file  Stress:   . Feeling of Stress : Not on file  Social Connections:   . Frequency of Communication with Friends and Family: Not on file  . Frequency of Social Gatherings with Friends and Family: Not on file  . Attends Religious  Services: Not on file  . Active Member of Clubs or Organizations: Not on file  . Attends Archivist Meetings: Not on file  . Marital Status: Not on file    Allergies:  Allergies  Allergen Reactions  . Penicillins Hives, Itching and Other (See Comments)    Did it involve swelling of the face/tongue/throat, SOB, or low BP? No Did it involve sudden or severe rash/hives, skin peeling, or any reaction on the inside of your mouth or nose? No Did you need to seek medical attention at a hospital or doctor's office? Unknown When did it last happen?childhood allergy If all above answers are "NO", may proceed with cephalosporin use.    Marland Kitchen Alcohol-Sulfur [Sulfur] Other (See Comments)    Burns skin    Objective:    Vital Signs:   BP: ()/()  Arterial Line BP: ()/()    There were no vitals filed for this visit.   Physical Exam     General:  Fatigue appearing [name]. increased respiratory effort HEENT: Normal Neck: Supple. no JVD. Carotids 2+ bilat; no bruits. No lymphadenopathy or thyromegaly appreciated. Cor: LVAD Hum Driveline Site: anchor in place. Clean/dry Lungs: diffuse rhonchi/ crackles  Abdomen: Soft, nontender, nondistended. No hepatosplenomegaly. No bruits or masses. Good bowel sounds. Extremities: No cyanosis,  clubbing, rash, edema Neuro: Alert & oriented x 3, cranial nerves grossly intact. moves all 4 extremities w/o difficulty. Affect pleasant.   Telemetry   N/a  EKG   12 lead pending   Labs     Basic Metabolic Panel: Recent Labs  Lab 02/26/19 1349  NA 130*  K 4.1  CL 97*  CO2 23  GLUCOSE 274*  BUN 32*  CREATININE 2.00*  CALCIUM 8.4*    Liver Function Tests: Recent Labs  Lab 02/26/19 1349  AST 47*  ALT 37  ALKPHOS 106  BILITOT 1.2  PROT 8.6*  ALBUMIN 2.5*   No results for input(s): LIPASE, AMYLASE in the last 168 hours. No results for input(s): AMMONIA in the last 168 hours.  CBC: Recent Labs  Lab 02/26/19 1349  WBC 8.2    NEUTROABS 6.7  HGB 10.3*  HCT 33.0*  MCV 86.2  PLT 236    Cardiac Enzymes: No results for input(s): CKTOTAL, CKMB, CKMBINDEX, TROPONINI in the last 168 hours.  BNP: BNP (last 3 results) Recent Labs    02/26/19 1349  BNP 439.6*    ProBNP (last 3 results) No results for input(s): PROBNP in the last 8760 hours.   CBG: No results for input(s): GLUCAP in the last 168 hours.  Coagulation Studies: Recent Labs    02/26/19 1349  LABPROT 25.6*  INR 2.3*    Imaging: DG Chest Port 1 View  Result Date: 02/26/2019 CLINICAL DATA:  Increased weakness, cough, fluid retention, syncopal episode for 3 minutes, history of LVAD, ischemic cardiomyopathy, coronary artery disease, CHF, atrial flutter, type II diabetes mellitus, hypertension EXAM: PORTABLE CHEST 1 VIEW COMPARISON:  Portable exam 1332 hours compared to 09/07/2017 FINDINGS: LVAD projects over cardiac apex. LEFT subclavian AICD with leads projecting over RIGHT atrium, RIGHT ventricle, and coronary sinus. Post median sternotomy. Enlargement of cardiac silhouette. Diffuse BILATERAL pulmonary infiltrates question pulmonary edema though multifocal pneumonia. No pleural effusion or pneumothorax. IMPRESSION: Enlargement of cardiac silhouette post LVAD and AICD. Diffuse BILATERAL pulmonary infiltrates favor pulmonary edema though multifocal pneumonia not excluded. Electronically Signed   By: Lavonia Dana M.D.   On: 02/26/2019 13:41     Patient Profile   ANDIE MORTIMER is a 68 y.o. male with h/o chronic systolic CHF due to ICM, s/p LVAD HM3 08/16/2017, [name], [name], [name], [name] previously treated w/ amiodarone, [name], HTN, HL, and CKD III presenting w/ acute hypoxic respiratory failure, felt to be most likely 2/2 amiodarone toxicity.   Assessment/Plan   1. Acute Hypoxic Respiratory Failure: h/o progressive SOB + cough over the last several weeks w/ 2 recent negative COVID test. Seen in ED  12/27 and ESR was elevated at 80 and CXR showed diffuse bilateral pulmonary infiltrates (WBC WNL), highly suspicious of amiodarone toxicity. Was previously on amiodarone 200 mg qam and 100 mg qpm given [name]. Amiodarone discontinued 12/27. Now with severe hypoxia w/ O2 sats in the 50s. Placed on NRB w/ improvement.  - Will admit to CCU.  - Repeat COVID test and check PCT  - Start solumedrol 60 mg Q6H x 2 days, followed by daily prednisone - Order non contrast chest CT to confirm pulmonary toxicity  - Obtain Pulmonary Consultation   2. Chronic systolic CHF with biventricular failure-> cardiogenic shock: - Echo 08/13/2017 EF 20-25%.s/p Medtronic BiV ICD in place. Cath 12/18 with stable 1v CAD.  - s/p HM-3 implant 08/16/17.  - Discussed personally w/  Dr. Tempie Hoist. Suspect his dyspnea is primarily 2/2 problem #1. Will plan to hold diuretics for now. Follow volume status closely. Daily wts and strict I/Os.   3. VAD: s/p HM-3 implant 08/16/17 - Ramp echo 2/20: LVEF 20-25%. Good cannula position. LV has shrunk in size. RV moderately down. AoV opening on every beat at 5300. Septum shifted to left. Speed decreased to 5100. (5000 looked good as well). RVOT VTI 22 - Driveline site stable  - Continue coumadin. Pharmacy to assist w/ dosing. INR goal 2-2.5   - monitor LDH   3. CKD IIIb: - Creatinine ranges 1.5-2.0.  - admit labs pending. monitor closely   4. [name]/VF:  - ICD shock 12/09/17 with VT in 180s.  - ICD recently interrogated 12/27. No VT - previously on amiodarone but now on hold for possible pulmonary toxicity  5. AFL/atrial fibrillation:  - S/p previous ablation. Obtain 12 lead EKG on admit - holding amio. Continue coumadin   6. CAD: s/p remote PCI of RCA and LAD - denies recent CP   7. DM: - SSI.  -monitor closely w/ steroids    Lyda Jester, PA-C 03/02/2019, 10:02 AM  Advanced Heart Failure Team Pager [phone] (M-F; [place])  Please contact [facility]  for night-coverage after hours (4p -7a ) and weekends on [email or web address]  Patient seen with PA, agree with the above note.  He came to clinic today and was noted to be markedly hypoxemic, requiring non-rebreather.  He was directly admitted.  CXR with diffuse bilateral interstitial and airspace disease. Currently on 12L [place] with oxygen saturation 97%. Of note, reports about 7 lb weight gain the last few days.   General: NAD.  HEENT: Normal. Neck: Supple, JVP 14 cm. Carotids OK.  Cardiac:  Mechanical heart sounds with LVAD hum present.  Lungs:  Diffuse crackles bilaterally.  Abdomen:  NT, ND, no HSM. No bruits or masses. +BS  LVAD exit site: Well-healed and incorporated. Dressing dry and intact. No erythema or drainage. Stabilization device present and accurately applied. Driveline dressing changed daily per sterile technique. Extremities:  Warm and dry. No cyanosis, clubbing, rash. 1+ edema to knees.  Neuro:  Alert & oriented x 3. Cranial nerves grossly intact. Moves all 4 extremities w/o difficulty. Affect pleasant    1. Acute hypoxemic respiratory failure: Recently seen in the ER and now admitted from clinic with marked hypoxemic requiring NRBM.  CXR with diffuse bilateral interstitial and airspace disease.  12/27 ESR elevated to 80.  PCT today elevated mildly 0.67.  Afebrile, WBCs 10.  Also noted to be volume overloaded on exam with significant JVD, weight gain, and peripheral edema. COVID-19 negative x 2 on 12/27.  - Concern for amiodarone lung toxicity with diffuse airspace disease, marked hypoxemia, and elevated ESR.  Amiodarone has been stopped, will get high resolution CT of chest and cover with IV Solumedrol. Resend ESR.  - Volume overload also present but creatinine up from baseline.  Will give 1 dose of Lasix 60 mg IV and follow response.   - Infectious cause less likely though PCT is mildly elevated.  No fever.  Will send blood/sputum cultures and respiratory virus panel. Negative for COVID-19  on 12/27 but will resend.  - Consult pulmonary.  2. 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  MDT CRT-D device.  Volume overload on exam. LVAD parameters stable on interrogation.  - Lasix 60 mg IV x 1 as above and follow response.  - INR goal 2-2.5  with LVAD, continue ASA 81 and adjust warfarin.  - LDH higher at 475 but with acute inflammatory disease, suspect pump thrombus formation less likely.  Keep INR therapeutic.  3. Atrial fibrillation: Paroxysmal.  NSR today.  4. 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  5. AKI on CKD stage 3: Creatinine up to 2.34 today from baseline around 1.4 a couple of months ago.  However, he looks volume overloaded with significant JVD.  - Will give a dose of IV Lasix as above.  - Hold losartan, will use hydralazine 25 mg tid for BP control.   Loralie Champagne 02/06/2019 2:05 PM

## 2019-02-28 NOTE — Consult Note (Signed)
NAME:  [name] [name], MRN:  [id number], DOB:  1950/09/06, LOS: 0 ADMISSION DATE:  02/09/2019, CONSULTATION DATE:  02/14/2019 REFERRING MD:  Dr. Aundra Dubin, CHIEF COMPLAINT:  Hypoxia  Brief History   71 yoM with extensive cardiac history with LVAD presenting with acute onset hypoxia possibly concerning for HF exacerbation vs amiodarone toxicity vs unknown process.  COVID negative x 3 thus far.  Pulmonary consulted for further evaluation.    History of present illness   68 year old male with prior history of Chronic HFrEF 2/2 ICM s/p LVAD HM3 placement 08/16/2017 with BiV Medtronic ICD on coumadin, [name], PAD, VT s/p ablation, DMT2, HTN, HLD, and CKD III admitted with acute onset hypoxia and dyspnea.   On chart review, patient was started on amiodarone 200 mg BID after being seen on 12/10/2017 for VT with attempted ATPx4 then requiring cardioversion to NSR.   PFTs 04/27/2017 showing "reduced lung volumes, increased FEV1/FVC ratio and diffusion defect suggest an interstitial process such as fibrosis or interstitial inflammation. Minimal airway obstruction is present. In view of the severity of the diffusion defect, studies with exercise would be helpful to evaluate the presence of hypoxemia."    05/05/2018 RHC >> VAD speed 5100 RA = 5 RV = 44/7 PA = 43/10 (22) PCW = 13 Fick cardiac output/index =4.9/2.3 PVR = 1.9 WU FA sat = 96% PA sat = 68%, 71% Well-compensated hemodynamics with VAD support. RV looks good.  Patient lives at home with his wife.  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  Former smoker, smoked 1 ppd from the age of 69 to 61.  Has lived in the house he built since the 1's.  No exposure to birds or feathered pillows.  No recent travel.  No hot tub exposures.  No prior lung issues or known family autoimmune or lung disorders.  No recent sick or known COVID exposures.  Not on home O2. Reports he is "mostly"  compliant with his dietary restrictions.  Reports being fatigued since having his LVAD but otherwise in decent health without exertional limitations.  Denies chronic cough.   Presents now with increasing 2 week history of weakness, non productive cough, and dyspnea.  Seen in UC one week ago with negative COVID.  Additionally reported some increasing LE edema and orthopnea.  Had near syncopal episode on 12/27 in which patient was evaluated in Er and evaluated by HF team.  Found to have stable rhythm AF with MAPs 100-110, hs trop 6 and BNP 439, CXR with diffuse infiltrates and saturations 99-100 on 2L . He was diuresed with over ~1L UOP and discharged home on patient preference.  Differential included heart failure but given normal functioning VAD, amiodarone toxicity vs COVID included. ESR noted to be 80 therefore amiodarone was stopped and started on prednisone.   He was sent home with increased diuretics and followed back up in HF clinic.    Today in HF clinic, he presented weak and confused, found to be hypoxic with room air saturations in the 50's with improvement once on NRB.  He was admitted directly to [facility] ICU by HF team.  Labs worsening LFTs, AKI, BNP 439 (prior 56 08/2017), LDH 471 (trending up since 12/2018 noted to 191), INR 2.9, WBC 10.3, PCT 0.67, CXR again showing diffuse infiltrates. Pulmonary consulted for further recommendations.    Past Medical History  Chronic HFrEF 2/2 ICM s/p LVAD  HM3 placement 08/16/2017 with BiV Medtronic ICD on coumadin, [name], PAD, VT s/p ablation, DMT2, HTN, HLD, CKD III  Significant Hospital Events   12/27 ER evaluation 12/29 Admit  Consults:  Pulmonary   Procedures:    Significant Diagnostic Tests:  12/29 TTE >>  Micro Data:  12/27 SARS2 >> neg 12/29 RVP >> 12/29 SARS 2 >>  12/29 MRSA PCR >> neg 12/29 BCx 2 >>   Antimicrobials:  n/a  Interim history/subjective:  Denies any current pain or SOB.  Denies any significant  improvements thus far.   Objective   Blood pressure 105/89, pulse (!) 59, temperature 98 F (36.7 C), temperature source Oral, resp. rate 13, SpO2 100 %.        Intake/Output Summary (Last 24 hours) at 02/27/2019 1506 Last data filed at 02/14/2019 1200 Gross per 24 hour  Intake --  Output 250 ml  Net -250 ml   There were no vitals filed for this visit.  Examination: General:  Well nourished adult male sitting semi upright in bed in NAD HEENT: MM pink/moist Neuro: alert/oriented, MAE CV: LV hum PULM:  Non labored/ speaking full sentences on salter HFNC 10L, mild right posterior crackles clears with non congested cough GI: soft, bs active  Extremities: warm/dry, +2 LE edema  Skin: no rashes   Resolved Hospital Problem list    Assessment & Plan:   Acute hypoxic respiratory failure - multifactorial differential including acute on chronic HF,  +/- ILD process- specifically r/o amiodarone toxicity given elevated ESR.  Less likely infectious process given no infectious predrome, normal WBC, afebrile and reassuring PCT.  Less likely PE given therapeutic INR on coumadin.  P:  Supplemental O2 for sat goal > 90% and monitor for respiratory distress (currently is speaking full sentences without difficulty and comfortable on salter HFNC) Amiodarone stopped 12/27 Agree with solumedrol 60 mg q 6hr for now HRCT has been ordered Autoimmune labs and HSP panel ordered TTE pending Diuresis per HF team Intermittent CXR Strict I/O's/ daily weights  COVID x 2 negative, pending additional PCR   Remainder per primary tream.  PCCM will continue to follow.   Best practice:  Diet: Cardiac  Pain/Anxiety/Delirium protocol (if indicated): n/a VAP protocol (if indicated): n/a DVT prophylaxis: coumadin GI prophylaxis: PPI Glucose control: SSI Code Status: No CPR Family Communication: patient updated on plan of care Disposition: ICU  Labs   CBC: Recent Labs  Lab 02/26/19 1349  02/02/2019 0955 01/31/2019 1022  WBC 8.2 10.0 10.3  NEUTROABS 6.7  --   --   HGB 10.3* 11.0* 10.2*  HCT 33.0* 36.0* 31.2*  MCV 86.2 86.5 81.9  PLT 236 308 329    Basic Metabolic Panel: Recent Labs  Lab 02/26/19 1349 02/20/2019 0955 02/27/2019 1022  NA 130* 131* 131*  K 4.1 4.1 4.1  CL 97* 95* 94*  CO2 23 17* 22  GLUCOSE 274* 264* 263*  BUN 32* 48* 47*  CREATININE 2.00* 2.52* 2.34*  CALCIUM 8.4* 8.8* 8.5*   GFR: Estimated Creatinine Clearance: 37.4 mL/min (A) (by C-G formula based on SCr of 2.34 mg/dL (H)). Recent Labs  Lab 02/26/19 1349 02/01/2019 0955 02/09/2019 1022  PROCALCITON  --   --  0.67  WBC 8.2 10.0 10.3    Liver Function Tests: Recent Labs  Lab 02/26/19 1349 02/04/2019 0955 02/03/2019 1022  AST 47* 161* 518*  ALT 37 DUPLICATE REQUEST 841*  ALKPHOS 106 92 88  BILITOT 1.2 1.9* 1.4*  PROT 8.6* 8.9* 8.2*  ALBUMIN 2.5* 2.5* 2.3*   No results for input(s): LIPASE, AMYLASE in the last 168 hours. No results for input(s): AMMONIA in the last 168 hours.  ABG    Component Value Date/Time   PHART 7.462 (H) 02/26/2019 0950   PCO2ART 30.8 (L) 02/20/2019 0950   PO2ART 272 (H) 02/10/2019 0950   HCO3 21.7 02/23/2019 0950   TCO2 21 (L) 05/05/2018 1352   ACIDBASEDEF 1.6 02/11/2019 0950   O2SAT 100.0 02/01/2019 0950     Coagulation Profile: Recent Labs  Lab 02/26/19 1349 02/01/2019 0955 02/16/2019 1122  INR 2.3* 3.0* 2.9*    Cardiac Enzymes: No results for input(s): CKTOTAL, CKMB, CKMBINDEX, TROPONINI in the last 168 hours.  HbA1C: Hemoglobin A1C  Date/Time Value Ref Range Status  02/10/2018 01:50 PM 8.9 (A) 4.0 - 5.6 % Final  11/11/2017 03:11 PM 6.3 (A) 4.0 - 5.6 % Final   Hgb A1c MFr Bld  Date/Time Value Ref Range Status  02/06/2019 11:20 AM 9.4 (H) 4.8 - 5.6 % Final    Comment:    (NOTE) Pre diabetes:          5.7%-6.4% Diabetes:              >6.4% Glycemic control for   <7.0% adults with diabetes   08/14/2017 05:30 AM 8.4 (H) 4.8 - 5.6 % Final     Comment:    (NOTE) Pre diabetes:          5.7%-6.4% Diabetes:              >6.4% Glycemic control for   <7.0% adults with diabetes     CBG: Recent Labs  Lab 02/23/2019 1134  GLUCAP 242*    Review of Systems:   Review of Systems  Constitutional: Positive for malaise/fatigue. Negative for chills, fever and weight loss.  Respiratory: Positive for cough, shortness of breath and wheezing. Negative for hemoptysis and sputum production.   Cardiovascular: Positive for leg swelling. Negative for chest pain, palpitations and orthopnea.  Gastrointestinal: Negative for abdominal pain, blood in stool, constipation, diarrhea, nausea and vomiting.  Neurological: Positive for weakness. Negative for focal weakness and loss of consciousness.  As per HPI otherwise negative.   Past Medical History  He,  has a past medical history of AICD (automatic cardioverter/defibrillator) present (02/05/2014), Atrial flutter ([place]) (04/2012), CAD (coronary artery disease) (4403,4742 X 2 ), CHF (congestive heart failure) ([place]), Chronic anticoagulation, Chronic systolic heart failure ([place]), CKD (chronic kidney disease), Diabetic retinopathy ([place]), DM type 2 (diabetes mellitus, type 2) ([place]), HTN (hypertension), Hypercholesteremia, ICD (implantable cardiac defibrillator) in place, Ischemic cardiomyopathy (March 2015), Nephrolithiasis, and Ventricular tachycardia ([place]).   Surgical History    Past Surgical History:  Procedure Laterality Date   ATRIAL FLUTTER ABLATION N/A 05/19/2012   Procedure: ATRIAL FLUTTER ABLATION;  Surgeon: Thompson Grayer, MD;  Location: [facility] CATH LAB;  Service: Cardiovascular;  Laterality: N/A;   BI-VENTRICULAR IMPLANTABLE CARDIOVERTER DEFIBRILLATOR UPGRADE N/A 02/05/2014   Procedure: BI-VENTRICULAR IMPLANTABLE CARDIOVERTER DEFIBRILLATOR UPGRADE;  Surgeon: Evans Lance, MD;  Location[facility] C[facility] LAB;  Service: Cardiovascular;  Laterality: N/A;   BIV ICD GENERTAOR CHANGE OUT  02/05/2014   Upgrade to  Medtronic biventricular ICD, serial number  [id number] by Dr. [name]   CARDIAC DEFIBRILLATOR PLACEMENT  2007    Medtronic Maximo VR, serial number [id number]   INSERTION OF IMPLANTABLE LEFT VENTRICULAR ASSIST DEVICE N/A 08/16/2017   Procedure: INSERTION OF IMPLANTABLE LEFT VENTRICULAR ASSIST DEVICE-HM3;  Surgeon: Prescott Gum,  Collier Salina, MD;  Location: [place];  Service: Open Heart Surgery;  Laterality: N/A;   IR FLUORO GUIDE CV LINE RIGHT  08/12/2017   IR US GUIDE VASC ACCESS RIGHT  08/12/2017   MEDIASTINAL EXPLORATION  08/17/2017   Procedure: MEDIASTINAL REXPLORATION with evacuation of hematoma;  Surgeon: Prescott Gum, Collier Salina, MD;  Location: [place];  Service: Open Heart Surgery;;   PERCUTANEOUS CORONARY STENT INTERVENTION (PCI-S)  January 2002   PTCA/Stent Distal RCA   PERCUTANEOUS CORONARY STENT INTERVENTION (PCI-S)  June 2002   PTCA/Stent x 3 RCA, thrombolysis - failed   PERCUTANEOUS CORONARY STENT INTERVENTION (PCI-S)  July 2006   TAXUS stents to prox and distal LAD   RIGHT HEART CATH N/A 07/22/2017   Procedure: RIGHT HEART CATH;  Surgeon: Jolaine Artist, MD;  Location: [place] CV LAB;  Service: Cardiovascular;  Laterality: N/A;   RIGHT HEART CATH N/A 08/13/2017   Procedure: RIGHT HEART CATH - swan;  Surgeon: Jolaine Artist, MD;  Location: [place] CV LAB;  Service: Cardiovascular;  Laterality: N/A;   RIGHT HEART CATH N/A 05/05/2018   Procedure: RIGHT HEART CATH;  Surgeon: Jolaine Artist, MD;  Location: [place] CV LAB;  Service: Cardiovascular;  Laterality: N/A;   RIGHT/LEFT HEART CATH AND CORONARY ANGIOGRAPHY N/A 02/18/2017   Procedure: RIGHT/LEFT HEART CATH AND CORONARY ANGIOGRAPHY;  Surgeon: Jolaine Artist, MD;  Location: [place] CV LAB;  Service: Cardiovascular;  Laterality: N/A;   STERNAL CLOSURE N/A 08/19/2017   Procedure: STERNAL CLOSURE;  Surgeon: Ivin Poot, MD;  Location: [place];  Service: Thoracic;  Laterality: N/A;   TEE WITHOUT CARDIOVERSION  N/A 08/16/2017   Procedure: TRANSESOPHAGEAL ECHOCARDIOGRAM (TEE);  Surgeon: Prescott Gum, Collier Salina, MD;  Location: [place];  Service: Open Heart Surgery;  Laterality: N/A;   TEE WITHOUT CARDIOVERSION N/A 08/19/2017   Procedure: TRANSESOPHAGEAL ECHOCARDIOGRAM (TEE);  Surgeon: Prescott Gum, Collier Salina, MD;  Location: [place];  Service: Thoracic;  Laterality: N/A;   TRICUSPID VALVE REPLACEMENT N/A 08/16/2017   Procedure: TRICUSPID VALVE REPAIR using Oletta Lamas MC3 Ring size 30;  Surgeon: Ivin Poot, MD;  Location: [place];  Service: Open Heart Surgery;  Laterality: N/A;     Social History   reports that he quit smoking about 19 years ago. His smoking use included cigarettes. He has a 29.00 pack-year smoking history. He has never used smokeless tobacco. He reports that he does not drink alcohol or use drugs.   Family History   His family history includes Alzheimer's disease in his mother; CAD in his brother, brother, brother, and brother; Diabetes in his brother; Healthy in his daughter and son; Heart attack in his mother; Heart failure in his father; Other in his brother; Prostate cancer in his brother. There is no history of Stomach cancer or Colon cancer.   Allergies Allergies  Allergen Reactions   Penicillins Hives, Itching and Other (See Comments)    Did it involve swelling of the face/tongue/throat, SOB, or low BP? No Did it involve sudden or severe rash/hives, skin peeling, or any reaction on the inside of your mouth or nose? No Did you need to seek medical attention at a hospital or doctor's office? Unknown When did it last happen?childhood allergy If all above answers are NO, may proceed with cephalosporin use.     Alcohol-Sulfur [Sulfur] Other (See Comments)    Burns skin     Home Medications  Prior to Admission medications   Medication Sig Start Date End Date Taking? Authorizing  Provider  acetaminophen (TYLENOL) 500 MG tablet Take 1,000 mg by mouth every 6 (six) hours as needed for  moderate pain or headache.     [provider]  ascorbic acid (VITAMIN C) 500 MG tablet Take 1 tablet (500 mg total) by mouth 2 (two) times daily. 09/24/17   Love, Ivan Anchors, PA-C  aspirin 81 MG chewable tablet Chew 1 tablet (81 mg total) by mouth daily. 09/10/17   Clegg, Amy D, NP  gabapentin (NEURONTIN) 300 MG capsule Take 2 capsules (600 mg total) by mouth 2 (two) times daily. 02/09/19   Bensimhon, Shaune Pascal, MD  insulin NPH-regular Human (70-30) 100 UNIT/ML injection Inject 30 Units into the skin daily.    [provider]  Insulin Pen Needle (COMFORT EZ PEN NEEDLES) 32G X 6 MM MISC 1 application by Does not apply route at bedtime. 09/24/17   Love, Ivan Anchors, PA-C  losartan (COZAAR) 25 MG tablet Take 1 tablet (25 mg total) by mouth daily. 09/27/18   Bensimhon, Shaune Pascal, MD  magnesium oxide (MAG-OX) 400 (241.3 Mg) MG tablet Take 1 tablet (400 mg total) by mouth 2 (two) times daily. 09/24/17   Love, Ivan Anchors, PA-C  Multiple Vitamin (MULTIVITAMIN WITH MINERALS) TABS tablet Take 1 tablet by mouth daily. 09/25/17   Love, Ivan Anchors, PA-C  pantoprazole (PROTONIX) 40 MG tablet Take 1 tablet (40 mg total) by mouth daily. 02/22/19   Clegg, Amy D, NP  predniSONE (DELTASONE) 20 MG tablet Take 3 tablets (60 mg total) by mouth daily with breakfast for 7 days, THEN 2 tablets (40 mg total) daily with breakfast. 02/26/19 04/04/19  Bensimhon, Shaune Pascal, MD  torsemide (DEMADEX) 20 MG tablet Take 1 tablet (20 mg total) by mouth every Monday, Wednesday, and Friday. 02/09/19   Bensimhon, Shaune Pascal, MD  triamcinolone (NASACORT ALLERGY 24HR) 55 MCG/ACT AERO nasal inhaler Place 2 sprays into the nose daily.    [provider]  warfarin (COUMADIN) 2.5 MG tablet Take on tablet daily or as directed Patient taking differently: Take 2.5 mg (1 tab) daily or as directed 10/06/18   Bensimhon, Shaune Pascal, MD         Kennieth Rad, MSN, AGACNP-BC [facility] 02/27/2019, 3:23 PM

## 2019-02-28 NOTE — Progress Notes (Signed)
Patient presents for nurse visit after weekend ED visit in [facility] today with daughter [name] [name].  Denies any issues with VAD equipment or drive line site issues.   Wife called VAD pager prior to arrival and asked for help to get [name] [name] in clinic; stating he "can't breathe and will need w/c"  VAD Coordinators met family at car with w/c. [name] [name] very weak, slow to answer questions, denying need to come in hospital.   Unable to perform med reconciliation, daughter doesn't have med list and [name] [name] unable to answer at this time. His wife confirmed he took meds late last night and only took Torsemide 80 mg and prednisone this am. Daughter says pt voided 5 oz since am  Torsemide.   O2 sat on RA 56%, nailbeds dusky. Placed patient on 100% NRM with sats up to 95 - 100% very quickly. #20 angiocath started right FA per Tanda Rockers, RN. Labs obtained.  Stat PCXR and ABG obtained. Drs. Bensimhon, McLean updated. Will arrange for admission to [place].  Pt's mentation improved with high flow O2; nailbeds pink, answering questions appropriately.   Vital Signs: Temp: 97.7 Doppler Pressure: 108 Automatc BP:  121/77 (91) HR: 65 - 70 SR SPO2: 56% - 100%  Weight: 241.2 lb w/o eqt  Last weight: 243.4 lb w/o eqip  VAD Indication: Destination Therapy- Implanted 08/16/17   LVAD assessment: HM III: VAD Speed: 5100 Flow: 3.7 Power: 3.6w    PI: 6.4 Alarms: one power disconnect Events:  rare Hct: 20  Fixed speed: 5100 Low speed limit: 5000  Primary: Back up battery expiring in 15 months. Secondary: Back up battery expiring in 15 months   I reviewed the LVAD parameters from today and compared the results to the patient's prior recorded data. LVAD interrogation was NEGATIVE for significant power changes, NEGATIVE for clinical alarms and NEGATIVE for PI events/speed drops. No other changes were made and pump is functioning within specified parameters. Pt is performing daily controller and system monitor self tests  along with completing weekly and monthly maintenance for LVAD equipment.  LVAD equipment check completed and is in good working order. Back-up equipment present.   Annual Equipment Maintenance on UBC/PM was performed on 08/03/2018.   Exit Site Care: Drive line is being maintained weekly by wife. Drive line exit site well healed and incorporated. The velour is fully implanted at exit site. Dressing dry and intact. No erythema or drainage. Stabilization device present and accurately applied. Pt denies fever or chills. Last dressing change was performed on 02/27/19.     Device: Medtronic 3 Lead Therapies: ON:  VF 200 bpm, FVT 200 - 240; VT 158 - 200 - ATP Therapies: AT/AF monitor on 171 bpm Pacing: DDD 60 Last check: 11/10/18  Labs:  Doppler 104 - reflective of MAP  Hgb 11.0 - No S/S of bleeding. Specifically denies melena/BRBPR or nosebleeds.  LDH pending; established baseline ranging from 170-250.  Denies tea-colored urine. No power elevations noted on interrogation.   Transported patient to [place]; bedside report given to nurse.    Zada Girt RN [place] Coordinator  Office: [phone]  24/7 Pager: [phone]

## 2019-03-01 LAB — CBC
HCT: 31.5 % — ABNORMAL LOW (ref 39.0–52.0)
Hemoglobin: 10.3 g/dL — ABNORMAL LOW (ref 13.0–17.0)
MCH: 26.3 pg (ref 26.0–34.0)
MCHC: 32.7 g/dL (ref 30.0–36.0)
MCV: 80.6 fL (ref 80.0–100.0)
Platelets: 284 10*3/uL (ref 150–400)
RBC: 3.91 MIL/uL — ABNORMAL LOW (ref 4.22–5.81)
RDW: 17.6 % — ABNORMAL HIGH (ref 11.5–15.5)
WBC: 12.8 10*3/uL — ABNORMAL HIGH (ref 4.0–10.5)
nRBC: 0.2 % (ref 0.0–0.2)

## 2019-03-01 LAB — SJOGRENS SYNDROME-B EXTRACTABLE NUCLEAR ANTIBODY: SSB (La) (ENA) Antibody, IgG: <0.2 AI (ref 0.0–0.9)

## 2019-03-01 LAB — BASIC METABOLIC PANEL
Anion gap: 14 (ref 5–15)
BUN: 52 mg/dL — ABNORMAL HIGH (ref 8–23)
CO2: 23 mmol/L (ref 22–32)
Calcium: 8.5 mg/dL — ABNORMAL LOW (ref 8.9–10.3)
Chloride: 95 mmol/L — ABNORMAL LOW (ref 98–111)
Creatinine, Ser: 1.8 mg/dL — ABNORMAL HIGH (ref 0.61–1.24)
GFR calc Af Amer: 44 mL/min — ABNORMAL LOW (ref >60)
GFR calc non Af Amer: 38 mL/min — ABNORMAL LOW (ref >60)
Glucose, Bld: 235 mg/dL — ABNORMAL HIGH (ref 70–99)
Potassium: 3.6 mmol/L (ref 3.5–5.1)
Sodium: 132 mmol/L — ABNORMAL LOW (ref 135–145)

## 2019-03-01 LAB — RESPIRATORY PANEL BY PCR

## 2019-03-01 LAB — GLUCOSE, CAPILLARY
Glucose-Capillary: 222 mg/dL — ABNORMAL HIGH (ref 70–99)
Glucose-Capillary: 262 mg/dL — ABNORMAL HIGH (ref 70–99)
Glucose-Capillary: 326 mg/dL — ABNORMAL HIGH (ref 70–99)
Glucose-Capillary: 335 mg/dL — ABNORMAL HIGH (ref 70–99)

## 2019-03-01 LAB — MPO/PR-3 (ANCA) ANTIBODIES
ANCA Proteinase 3: <3.5 U/mL (ref 0.0–3.5)
Myeloperoxidase Abs: <9 U/mL (ref 0.0–9.0)

## 2019-03-01 LAB — SJOGRENS SYNDROME-A EXTRACTABLE NUCLEAR ANTIBODY: SSA (Ro) (ENA) Antibody, IgG: <0.2 AI (ref 0.0–0.9)

## 2019-03-01 LAB — RHEUMATOID FACTOR: Rheumatoid fact SerPl-aCnc: 10.2 IU/mL (ref 0.0–13.9)

## 2019-03-01 LAB — PROTIME-INR
INR: 3.1 — ABNORMAL HIGH (ref 0.8–1.2)
Prothrombin Time: 31.8 seconds — ABNORMAL HIGH (ref 11.4–15.2)

## 2019-03-01 LAB — ANTI-DNA ANTIBODY, DOUBLE-STRANDED: ds DNA Ab: <1 IU/mL (ref 0–9)

## 2019-03-01 LAB — LACTATE DEHYDROGENASE: LDH: 352 U/L — ABNORMAL HIGH (ref 98–192)

## 2019-03-01 LAB — ANGIOTENSIN CONVERTING ENZYME: Angiotensin-Converting Enzyme: 52 U/L (ref 14–82)

## 2019-03-01 LAB — ANTI-SCLERODERMA ANTIBODY: Scleroderma (Scl-70) (ENA) Antibody, IgG: <0.2 AI (ref 0.0–0.9)

## 2019-03-01 MED ORDER — POTASSIUM CHLORIDE CRYS ER 20 MEQ PO TBCR
40.0000 meq | EXTENDED_RELEASE_TABLET | Freq: Once | ORAL | Status: AC
  Administered 2019-03-01: 40 meq via ORAL
  Filled 2019-03-01: qty 2

## 2019-03-01 MED ORDER — FUROSEMIDE 10 MG/ML IJ SOLN
60.0000 mg | Freq: Once | INTRAMUSCULAR | Status: AC
  Administered 2019-03-01: 08:00:00 60 mg via INTRAVENOUS
  Filled 2019-03-01: qty 6

## 2019-03-01 MED ORDER — CHLORHEXIDINE GLUCONATE CLOTH 2 % EX PADS
6.0000 | MEDICATED_PAD | Freq: Every day | CUTANEOUS | Status: DC
  Administered 2019-03-01 – 2019-03-17 (×13): 6 via TOPICAL

## 2019-03-01 MED ORDER — METHYLPREDNISOLONE SODIUM SUCC 125 MG IJ SOLR
60.0000 mg | Freq: Four times a day (QID) | INTRAMUSCULAR | Status: DC
  Administered 2019-03-01 – 2019-03-03 (×9): 60 mg via INTRAVENOUS
  Filled 2019-03-01 (×9): qty 2

## 2019-03-01 MED ORDER — ORAL CARE MOUTH RINSE
15.0000 mL | Freq: Two times a day (BID) | OROMUCOSAL | Status: DC
  Administered 2019-03-03 – 2019-03-12 (×17): 15 mL via OROMUCOSAL

## 2019-03-01 MED ORDER — INSULIN ASPART PROT & ASPART (70-30 MIX) 100 UNIT/ML ~~LOC~~ SUSP
40.0000 [IU] | Freq: Every day | SUBCUTANEOUS | Status: DC
  Administered 2019-03-01 – 2019-03-08 (×8): 40 [IU] via SUBCUTANEOUS
  Filled 2019-03-01 (×3): qty 10

## 2019-03-01 MED ORDER — WARFARIN 0.5 MG HALF TABLET
0.5000 mg | ORAL_TABLET | Freq: Once | ORAL | Status: AC
  Administered 2019-03-01: 18:00:00 0.5 mg via ORAL
  Filled 2019-03-01: qty 1

## 2019-03-01 NOTE — Progress Notes (Signed)
Chaplain engaged in follow-up visit with [name] and his wife.  Chaplain checked-in with them and offered continued support.  Chaplain will follow-up as needed.

## 2019-03-01 NOTE — Progress Notes (Signed)
Patient ID: [name] [name], male   DOB: Sep 24, 1950, 68 y.o.   MRN: [id number]   Advanced Heart Failure VAD Team Note  PCP-Cardiologist: No primary care provider on file.   Subjective:    Feels better but still with dyspnea.  On 10 L HFNC with saturation high 90s.  Diuresed well with Lasix 60 mg IV x 1 yesterday.  Creatinine down 2.34 => 1.8.   Afebrile, PCT 0.67 => 0.7.  No culture data yet.  ESR 68, CRP 23.   LDH 471 => 352.   MAP stable 80s-91.   High resolution CT chest done but not read => my review shows widespread airspace and interstitial disease.   LVAD INTERROGATION:  HeartMate 3 LVAD:   Flow 3.8 liters/min, speed 5100, power 3.5, PI 5.  3 PI events.  Objective:    Vital Signs:   Temp:  [96.7 F (35.9 C)-98 F (36.7 C)] 97.8 F (36.6 C) (12/30 0336) Pulse Rate:  [26-146] 135 (12/30 0700) Resp:  [0-31] 21 (12/30 0700) BP: (79-110)/(49-92) 100/84 (12/30 0700) SpO2:  [85 %-100 %] 97 % (12/30 0700) Weight:  [107.3 kg] 107.3 kg (12/30 0500) Last BM Date: 02/24/2019 Mean arterial Pressure 80s-91  Intake/Output:   Intake/Output Summary (Last 24 hours) at 03/01/2019 0721 Last data filed at 03/01/2019 0600 Gross per 24 hour  Intake --  Output 2160 ml  Net -2160 ml     Physical Exam    General:  Well appearing. No resp difficulty HEENT: normal Neck: supple. JVP 12 cm. Carotids 2+ bilat; no bruits. No lymphadenopathy or thyromegaly appreciated. Cor: Mechanical heart sounds with LVAD hum present. Lungs: Crackles bilaterally Abdomen: soft, nontender, nondistended. No hepatosplenomegaly. No bruits or masses. Good bowel sounds. Driveline: C/D/I; securement device intact and driveline incorporated Extremities: no cyanosis, clubbing, rash.  1+ ankle edema.  Neuro: alert & orientedx3, cranial nerves grossly intact. moves all 4 extremities w/o difficulty. Affect pleasant   Telemetry   NSR 60s with PVCs (personally reviewed)  Labs   Basic Metabolic Panel: Recent  Labs  Lab 02/26/19 1349 02/23/2019 0955 02/19/2019 1022 03/01/19 0415  NA 130* 131* 131* 132*  K 4.1 4.1 4.1 3.6  CL 97* 95* 94* 95*  CO2 23 17* 22 23  GLUCOSE 274* 264* 263* 235*  BUN 32* 48* 47* 52*  CREATININE 2.00* 2.52* 2.34* 1.80*  CALCIUM 8.4* 8.8* 8.5* 8.5*    Liver Function Tests: Recent Labs  Lab 02/26/19 1349 02/10/2019 0955 02/11/2019 1022  AST 47* 161* 557*  ALT 37 DUPLICATE REQUEST 322*  ALKPHOS 106 92 88  BILITOT 1.2 1.9* 1.4*  PROT 8.6* 8.9* 8.2*  ALBUMIN 2.5* 2.5* 2.3*   No results for input(s): LIPASE, AMYLASE in the last 168 hours. No results for input(s): AMMONIA in the last 168 hours.  CBC: Recent Labs  Lab 02/26/19 1349 02/19/2019 0955 02/05/2019 1022 03/01/19 0415  WBC 8.2 10.0 10.3 12.8*  NEUTROABS 6.7  --   --   --   HGB 10.3* 11.0* 10.2* 10.3*  HCT 33.0* 36.0* 31.2* 31.5*  MCV 86.2 86.5 81.9 80.6  PLT 236 308 274 284    INR: Recent Labs  Lab 02/26/19 1349 02/11/2019 0955 02/15/2019 1122 03/01/19 0415  INR 2.3* 3.0* 2.9* 3.1*    Other results:  EKG:    Imaging   DG Chest Port 1 View  Result Date: 01/31/2019 CLINICAL DATA:  Shortness of breath, CHF EXAM: PORTABLE CHEST 1 VIEW COMPARISON:  02/26/2011 FINDINGS: The left AICD and  LVAD remain in place, unchanged. Cardiomegaly. Diffuse interstitial and airspace opacities are again noted, unchanged. No visible effusions. No acute bony abnormality. IMPRESSION: Diffuse bilateral interstitial and airspace disease could reflect edema or infection. No change. Electronically Signed   By: Rolm Baptise M.D.   On: 02/11/2019 10:00   ECHOCARDIOGRAM LIMITED  Result Date: 02/13/2019   ECHOCARDIOGRAM LIMITED REPORT   Patient Name:   [name] [name] Date of Exam: 03/01/2019 Medical Rec #:  [id number]         Height:       70.0 in Accession #:    [id number]        Weight:       241.2 lb Date of Birth:  November 26, 1950         BSA:          2.26 m Patient Age:    52 years          BP:           105/89 mmHg  Patient Gender: M                 HR:           60 bpm. Exam Location:  Inpatient  Procedure: 2D Echo Indications:    CHF-Acute Systolic  History:        Patient has prior history of Echocardiogram examinations, most                 recent 04/29/2018. Ischemic Cardiomyopathy, Chronic renal                 disease, stage III , S/P TVR (tricuspid valve repair).  Sonographer:    Leavy Cella Referring Phys: Miller  1. Left ventricular ejection fraction, by visual estimation, is 20 to 25%. The left ventricle has severely decreased function. Left ventricular septal wall thickness was decreased. Mildly increased left ventricular posterior wall thickness. There is mildly increased left ventricular wall thickness.  2. Left ventricular diastolic parameters are indeterminate.  3. Global right ventricle has severely reduced systolic function.The right ventricular size is moderately enlarged.  4. Trivial mitral valve regurgitation.  5. Tricuspid valve regurgitation mild-moderate.  6. Tricuspid valve regurgitation mild-moderate.  7. Mildly elevated pulmonary artery systolic pressure.  8. A pacer wire is visualized.  9. The inferior vena cava is dilated in size with <50% respiratory variability, suggesting right atrial pressure of 15 mmHg. FINDINGS  Left Ventricle: Left ventricular ejection fraction, by visual estimation, is 20 to 25%. The left ventricle has severely decreased function. Left ventricular septal wall thickness was decreased. Mildly increased left ventricular posterior wall thickness.  There is mildly increased left ventricular wall thickness. Left ventricular diastolic parameters are indeterminate. Right Ventricle: The right ventricular size is moderately enlarged. Global RV systolic function is has severely reduced systolic function. The tricuspid regurgitant velocity is 2.32 m/s, and with an assumed right atrial pressure of 15 mmHg, the estimated  right ventricular systolic pressure is  mildly elevated at 36.5 mmHg. Mitral Valve: MV Area by PHT, 4.39 cm. MV PHT, 50.17 msec. Trivial mitral valve regurgitation. Tricuspid Valve: Tricuspid valve regurgitation mild-moderate. Aortic Valve: There is mild thickening of the aortic valve. There is mild calcification of the aortic valve. Venous: The inferior vena cava is dilated in size with less than 50% respiratory variability, suggesting right atrial pressure of 15 mmHg. Additional Comments: A pacer wire is visualized.  LEFT VENTRICLE  Normals PLAX 2D LVIDd:         5.40 cm 3.6 cm LVIDs:         4.90 cm 1.7 cm LV PW:         1.30 cm 1.4 cm LV IVS:        1.20 cm 1.3 cm LV SV:         28 ml   79 ml LV SV Index:   12.06   45 ml/m2  LEFT ATRIUM         Index LA diam:    5.20 cm 2.30 cm/m   AORTA                 Normals Ao Root diam: 3.10 cm 31 mm MITRAL VALVE              Normals   TRICUSPID VALVE             Normals MV Area (PHT): 4.39 cm             TR Peak grad:   21.5 mmHg MV PHT:        50.17 msec 55 ms     TR Vmax:        232.00 cm/s 288 cm/s MV Decel Time: 173 msec   187 ms MV E velocity: 75.40 cm/s 103 cm/s MV A velocity: 29.60 cm/s 70.3 cm/s MV E/A ratio:  2.55       1.5  Skeet Latch MD Electronically signed by Skeet Latch MD Signature Date/Time: 03/01/2019/5:22:20 PM    Final       Medications:     Scheduled Medications: . aspirin  81 mg Oral Daily  . Chlorhexidine Gluconate Cloth  6 each Topical Daily  . furosemide  60 mg Intravenous Once  . gabapentin  600 mg Oral BID  . hydrALAZINE  25 mg Oral Q8H  . insulin aspart  0-9 Units Subcutaneous TID WC  . insulin aspart protamine- aspart  40 Units Subcutaneous Q breakfast  . magnesium oxide  400 mg Oral BID  . methylPREDNISolone (SOLU-MEDROL) injection  60 mg Intravenous Q6H  . pantoprazole  40 mg Oral Daily  . potassium chloride  40 mEq Oral Once  . warfarin  0.5 mg Oral ONCE-1800  . Warfarin - Pharmacist Dosing Inpatient   Does not apply q1800      Infusions:   PRN Medications:  acetaminophen, ondansetron (ZOFRAN) IV   Assessment/Plan:    1. Acute hypoxemic respiratory failure: Recently seen in the ER and now admitted from clinic with marked hypoxemic requiring NRBM.  CXR with diffuse bilateral interstitial and airspace disease.  12/27 ESR elevated to 80 => 68 this admission with CRP 23.  PCT elevated mildly 0.67 => 0.7.  Afebrile, WBCs 12.8 on steroids.  Also noted to be volume overloaded on exam with significant JVD, weight gain, and peripheral edema. COVID-19 negative x 2 on 12/27 and again negative on 12/29.  - Concern for amiodarone lung toxicity versus other autoimmune lung process with diffuse airspace disease, marked hypoxemia, and elevated ESR.  Amiodarone has been stopped and he has been started on IV Solumedrol.  Autoimmune workup sent.  High resolution CT done, not officially read but shows diffuse bilateral interstitial and airspace disease.  - Volume overload also present.  Will give 1 dose of Lasix 60 mg IV again today and follow response (good diuresis yesterday).    - Infectious cause less likely though PCT is mildly elevated.  No  fever.  Have sent blood/sputum cultures and respiratory virus panel. Negative for COVID-19 again.   - Appreciate pulmonary assistance.   2. 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  MDT CRT-D device.  Volume overload on exam. LVAD parameters stable on interrogation. He diuresed well yesterday with IV Lasix, creatinine lower today at 1.8.  - Lasix 60 mg IV x 1 again today, follow response.  - INR goal 2-2.5 with LVAD, continue ASA 81 and adjust warfarin.  - LDH higher at 475 at admission but with acute inflammatory disease, suspect pump thrombus formation less likely.  Keep INR therapeutic. LDH down to 352 today.  3. Atrial fibrillation: Paroxysmal.  NSR today.  4. 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  5. AKI on CKD stage 3: Creatinine up to 2.34 at admit from baseline  around 1.4 a couple of months ago.  However, he looks volume overloaded with significant JVD.  Creatinine down to 1.8 today.  - Will give another dose of IV Lasix as above. Follow creatinine closely.  - Hold losartan, will use hydralazine 25 mg tid for BP control.  6. Diabetes: Blood glucose up with steroids.  - Continue SSI - Start back on home insulin 70/30.   I reviewed the LVAD parameters from today, and compared the results to the patient's prior recorded data.  No programming changes were made.  The LVAD is functioning within specified parameters.  The patient performs LVAD self-test daily.  LVAD interrogation was negative for any significant power changes, alarms or PI events/speed drops.  LVAD equipment check completed and is in good working order.  Back-up equipment present.   LVAD education done on emergency procedures and precautions and reviewed exit site care.  Length of Stay: 1  Loralie Champagne, MD 03/01/2019, 7:21 AM  VAD Team --- VAD ISSUES ONLY--- Pager [phone] (7am - 7am)  Advanced Heart Failure Team  Pager [phone] (M-F; 7a - 4p)  Please contact [facility] for night-coverage after hours (4p -7a ) and weekends on [email or web address]

## 2019-03-01 NOTE — Plan of Care (Signed)
CT shows ground glass opacity and inflammatory response ( nodules) backed up by ESR and CRP that are elevated. On Solumedrol, patient feels better, still on 10L [place], difficult to get a good saturation waveform due to low pulsatility ( LVAD).   Nsg Dx Impaired airway exchange related to amioderone side effect vs pneumonia- non febrile. Decrease inflammation with dteriods, watch Blood sugars- ( added 70/30 insulin  Interventions Titrate o2 to sao2 92 greater Maintain hemodynamic stability Assess  lungs Q4h and PRN for changes Monitor Vtial signs and o2 saturations, as well as fluid balance

## 2019-03-01 NOTE — Progress Notes (Signed)
ANTICOAGULATION CONSULT NOTE - [place] for warfarin Indication: atrial fibrillation/LVAD  Allergies  Allergen Reactions  . Penicillins Hives, Itching and Other (See Comments)    Did it involve swelling of the face/tongue/throat, SOB, or low BP? No Did it involve sudden or severe rash/hives, skin peeling, or any reaction on the inside of your mouth or nose? No Did you need to seek medical attention at a hospital or doctor's office? Unknown When did it last happen?childhood allergy If all above answers are "NO", may proceed with cephalosporin use.    Marland Kitchen Alcohol-Sulfur [Sulfur] Other (See Comments)    Burns skin    Patient Measurements: Weight: 236 lb 8.9 oz (107.3 kg)  Vital Signs: Temp: 97.8 F (36.6 C) (12/30 0336) Temp Source: Oral (12/30 0336) BP: 100/84 (12/30 0700) Pulse Rate: 135 (12/30 0700)  Labs: Recent Labs    02/26/19 1349 02/26/19 1500 02/17/2019 0955 02/18/2019 1022 02/23/2019 1122 03/01/19 0415  HGB 10.3*  --  11.0* 10.2*  --  10.3*  HCT 33.0*  --  36.0* 31.2*  --  31.5*  PLT 236  --  308 274  --  284  LABPROT 25.6*  --  30.9*  --  30.1* 31.8*  INR 2.3*  --  3.0*  --  2.9* 3.1*  CREATININE 2.00*  --  2.52* 2.34*  --  1.80*  TROPONINIHS 5 6  --   --   --   --     Estimated Creatinine Clearance: 48.2 mL/min (A) (by C-G formula based on SCr of 1.8 mg/dL (H)).   Medical History: Past Medical History:  Diagnosis Date  . AICD (automatic cardioverter/defibrillator) present 02/05/2014   Upgrade to Medtronic biventricular ICD, serial number  [id number]   . Atrial flutter ([place]) 04/2012   s/p TEE-EPS+RFCA 04/2012  . CAD (coronary artery disease) [id number] X 2    RCA-T, 70% PL (off CFX), 99% Prox LAD/90% Dist LAD, S/P TAXUS stent x 2  . CHF (congestive heart failure) ([place])   . Chronic anticoagulation   . Chronic systolic heart failure ([place])   . CKD (chronic kidney disease)   . Diabetic retinopathy ([place])   . DM type 2  (diabetes mellitus, type 2) (H[facility])    insulin dependent  . HTN (hypertension)   . Hypercholesteremia    ablation  . ICD (implantable cardiac defibrillator) in place   . Ischemic cardiomyopathy March 2015   20-25% 2D   . Nephrolithiasis   . Ventricular tachycardia (H[facility])      Assessment: 73 yoM with LVAD and hx AFib on warfarin admitted with possible amiodarone toxicity. INR above goal at 3.1, CBC and LDH stable.  *PTA Warfarin dose= 2.5mg  daily  Goal of Therapy:  INR 2.0-2.5 Monitor platelets by anticoagulation protocol: Yes   Plan:  -Warfarin 0.5 x1 tonight - 20% of home dose -Daily INR   Arrie Senate, PharmD, BCPS Clinical Pharmacist [phone] Please check AMION for all [place] numbers 03/01/2019

## 2019-03-01 NOTE — Progress Notes (Signed)
LVAD Coordinator Rounding Note:  Admitted 02/08/2019 per Dr. 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   HM III LVAD implanted on 08/16/17 by Dr. Prescott Gum  under Destination Therapy criteria.  Pt awake, lying in bed, says his "breathing is a little better". Currently on 10 l/min HFNC with sats 90 - 98%.  Vital signs: Temp: 97.5 HR: 71 Doppler Pressure:  78 Automatic BP:  83/73 (78) O2 Sat: 98% 10 l/min HFNC Wt: 236.5 lbs   LVAD interrogation reveals:  Speed: 5100 Flow: 4.5 Power:  3.5w PI: 3.1 Alarms: none Events:  3 PI events Hematocrit: 20 - do not adjust  Fixed speed: 5100  Low speed limit: 4800   Drive Line:  Dressing C/D/I; anchor intact and accurately applied. Weekly dressing changes per [facility] nurse; next dressing change due 03/06/19.  Labs:  LDH trend:  475>352  INR trend: 2.9>3.1  WBC trend: 10.0>12.8  Anticoagulation Plan: -INR Goal: 2.0 - 2.5 -ASA Dose: 81 mg daily  Device:Medtronic 3 Lead Therapies: ON:  VF 200 bpm, FVT 200 - 240; VT 158 - 200 - ATP Therapies: AT/AF monitor on 171 bpm Pacing: DDD 60 Last check: 11/10/18  Plan/Recommendations:  1. Page VAD coordinator with any VAD equipment or drive line issues. 2. Weekly dressing changes per BS nurse; next change due 03/06/19.  Zada Girt RN, VAD Coordinator VAD Pager: [phone]

## 2019-03-01 NOTE — Progress Notes (Signed)
NAME:  [name] [name], MRN:  [id number], DOB:  April 12, 1950, LOS: 1 ADMISSION DATE:  02/27/2019, CONSULTATION DATE:  02/02/2019 REFERRING MD:  Dr. Aundra Dubin, CHIEF COMPLAINT:  Hypoxia  Brief History   68 yoM with extensive cardiac history with LVAD presenting with acute onset hypoxia possibly concerning for HF exacerbation vs amiodarone toxicity vs unknown process.  COVID negative x 3 thus far.  Pulmonary consulted for further evaluation.    History of present illness   68 year old male with prior history of Chronic HFrEF 2/2 ICM s/p LVAD HM3 placement 08/16/2017 with BiV Medtronic ICD on coumadin, [name], PAD, VT s/p ablation, DMT2, HTN, HLD, and CKD III admitted with acute onset hypoxia and dyspnea.   On chart review, patient was started on amiodarone 200 mg BID after being seen on 12/10/2017 for VT with attempted ATPx4 then requiring cardioversion to NSR.   PFTs 04/27/2017 showing "reduced lung volumes, increased FEV1/FVC ratio and diffusion defect suggest an interstitial process such as fibrosis or interstitial inflammation. Minimal airway obstruction is present. In view of the severity of the diffusion defect, studies with exercise would be helpful to evaluate the presence of hypoxemia."    05/05/2018 RHC >> VAD speed 5100 RA = 5 RV = 44/7 PA = 43/10 (22) PCW = 13 Fick cardiac output/index =4.9/2.3 PVR = 1.9 WU FA sat = 96% PA sat = 68%, 71% Well-compensated hemodynamics with VAD support. RV looks good.  Patient lives at home with his wife.  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  Former smoker, smoked 1 ppd from the age of 68 to 48.  Has lived in the house he built since the 93's.  No exposure to birds or feathered pillows.  No recent travel.  No hot tub exposures.  No prior lung issues or known family autoimmune or lung disorders.  No recent sick or known COVID exposures.  Not on home O2. Reports he is "mostly"  compliant with his dietary restrictions.  Reports being fatigued since having his LVAD but otherwise in decent health without exertional limitations.  Denies chronic cough.   Presents now with increasing 2 week history of weakness, non productive cough, and dyspnea.  Seen in UC one week ago with negative COVID.  Additionally reported some increasing LE edema and orthopnea.  Had near syncopal episode on 12/27 in which patient was evaluated in Er and evaluated by HF team.  Found to have stable rhythm AF with MAPs 100-110, hs trop 6 and BNP 439, CXR with diffuse infiltrates and saturations 99-100 on 2L [place]. He was diuresed with over ~1L UOP and discharged home on patient preference.  Differential included heart failure but given normal functioning VAD, amiodarone toxicity vs COVID included. ESR noted to be 80 therefore amiodarone was stopped and started on prednisone.   He was sent home with increased diuretics and followed back up in HF clinic.    12/29 in HF clinic, he presented weak and confused, found to be hypoxic with room air saturations in the 50's with improvement once on NRB.  He was admitted directly to [facility] ICU by HF team.  Labs worsening LFTs, AKI, BNP 439 (prior 56 08/2017), LDH 471 (trending up since 12/2018 noted to 191), INR 2.9, WBC 10.3, PCT 0.67, CXR again showing diffuse infiltrates. Pulmonary consulted for further recommendations.    Past Medical History  Chronic HFrEF 2/2 ICM s/p LVAD  HM3 placement 08/16/2017 with BiV Medtronic ICD on coumadin, [name], PAD, VT s/p ablation, DMT2, HTN, HLD, CKD III  Significant Hospital Events   12/27 ER evaluation 12/29 Admit  Consults:  Pulmonary   Procedures:    Significant Diagnostic Tests:  12/29 TTE   1. Left ventricular ejection fraction, by visual estimation, is 20 to 25%. The left ventricle has severely decreased function. Left ventricular septal wall thickness was decreased. Mildly increased left ventricular posterior  wall thickness. There is  mildly increased left ventricular wall thickness.  2. Left ventricular diastolic parameters are indeterminate.  3. Global right ventricle has severely reduced systolic function.The right ventricular size is moderately enlarged.  4. Trivial mitral valve regurgitation.  5. Tricuspid valve regurgitation mild-moderate.  6. Tricuspid valve regurgitation mild-moderate.  7. Mildly elevated pulmonary artery systolic pressure.  8. A pacer wire is visualized.  9. The inferior vena cava is dilated in size with <50% respiratory variability, suggesting right atrial pressure of 15 mmHg.   ESR 68 CRP 23.5 ANA Scleroderma>> RA factor>>10.2 ANA IFA>> Anti DNA Antibody>> ANCA>> HSP>> Sjogerns A/B>> ACE>>52  12/30 HRCT Chest 1. Diffuse patchy pulmonary parenchymal ground-glass, scattered consolidation and suspected mild architectural distortion, with a slight upper/midlung zone predominance. Findings are new/progressive from 05/03/2018 and can be seen with amiodarone toxicity. Pneumonia is not excluded. 2. Trace bilateral pleural effusions. 3. Increased prominence of mediastinal lymph nodes, likely reactive. 4. Liver margin may be slightly irregular, raising suspicion for cirrhosis. 5. Gallbladder sludge and/or stones. 6.  Aortic atherosclerosis (ICD10-I70.0). 7. Enlarged pulmonic trunk, indicative of pulmonary arterial hypertension. Micro Data:  12/27 SARS2 >> neg 12/29 RVP >> 12/29 SARS 2 >>  12/29 MRSA PCR >> neg 12/29 BCx 2 >>  12/29 RVP>>  Antimicrobials:  n/a  Interim history/subjective:  Feeling better this morning. Weaned to 10lpm HFNC. No SOB with exertion. VSS.   Objective   Blood pressure (!) 88/70, pulse (!) 57, temperature (!) 97.5 F (36.4 C), temperature source Oral, resp. rate 19, weight 107.3 kg, SpO2 95 %.        Intake/Output Summary (Last 24 hours) at 03/01/2019 0955 Last data filed at 03/01/2019 0900 Gross per 24 hour  Intake  240 ml  Output 2510 ml  Net -2270 ml   Filed Weights   03/01/19 0500  Weight: 107.3 kg    Examination: General:  Well nourished adult male sitting semi upright in bed in NAD, talking on phone in full sentences HEENT: normocephalic, PERRL. MMM Neuro: A&Ox3, No focal deficits CV: Non pulsatile, LV hum PULM:  BBS clear, FNL, symmetrical. speaking full sentences on [place] HFNC 10L. No crackles or cough today  GI: soft, bs active  Extremities: warm/dry, +1 LE edema  Skin: no rashes   Resolved Hospital Problem list    Assessment & Plan:   Acute hypoxic respiratory failure - multifactorial differential including acute on chronic HF,  +/- ILD process- specifically r/o amiodarone toxicity given elevated ESR.  Less likely infectious process given no infectious predrome, normal WBC, afebrile and reassuring PCT.  Less likely PE given therapeutic INR on coumadin. Possible viral process.  Amiodarone tox dx of exclusion  HRCT as above. Full autoiummune/HSP w/u pending  TTE reviewed  P:  Supplemental O2 for sat goal > 90% and monitor for respiratory distress  Amiodarone stopped 12/27 Continue steroids  F/u Autoimmune labs and HSP panel  Diuresis per HF team Intermittent CXR Strict I/O's/ daily weights  COVID x 2  negative F/U vrial PCR   Remainder per primary tream.  PCCM will continue to follow.   Best practice:  Diet: Cardiac  Pain/Anxiety/Delirium protocol (if indicated): n/a VAP protocol (if indicated): n/a DVT prophylaxis: coumadin GI prophylaxis: PPI Glucose control: SSI Code Status: No CPR Family Communication: patient updated on plan of care Disposition: ICU  Labs and imaging for past 24 hours reviewed in EMR     Francine Graven, MSN, [facility]

## 2019-03-02 LAB — CBC
HCT: 38.9 % — ABNORMAL LOW (ref 39.0–52.0)
Hemoglobin: 12.3 g/dL — ABNORMAL LOW (ref 13.0–17.0)
MCH: 26.2 pg (ref 26.0–34.0)
MCHC: 31.6 g/dL (ref 30.0–36.0)
MCV: 82.8 fL (ref 80.0–100.0)
Platelets: 346 10*3/uL (ref 150–400)
RBC: 4.7 MIL/uL (ref 4.22–5.81)
RDW: 17.6 % — ABNORMAL HIGH (ref 11.5–15.5)
WBC: 15.2 10*3/uL — ABNORMAL HIGH (ref 4.0–10.5)
nRBC: 0.4 % — ABNORMAL HIGH (ref 0.0–0.2)

## 2019-03-02 LAB — COMPREHENSIVE METABOLIC PANEL
ALT: 97 U/L — ABNORMAL HIGH (ref 0–44)
AST: 63 U/L — ABNORMAL HIGH (ref 15–41)
Albumin: 2.2 g/dL — ABNORMAL LOW (ref 3.5–5.0)
Alkaline Phosphatase: 94 U/L (ref 38–126)
Anion gap: 13 (ref 5–15)
BUN: 57 mg/dL — ABNORMAL HIGH (ref 8–23)
CO2: 27 mmol/L (ref 22–32)
Calcium: 9 mg/dL (ref 8.9–10.3)
Chloride: 95 mmol/L — ABNORMAL LOW (ref 98–111)
Creatinine, Ser: 1.87 mg/dL — ABNORMAL HIGH (ref 0.61–1.24)
GFR calc Af Amer: 42 mL/min — ABNORMAL LOW (ref >60)
GFR calc non Af Amer: 36 mL/min — ABNORMAL LOW (ref >60)
Glucose, Bld: 127 mg/dL — ABNORMAL HIGH (ref 70–99)
Potassium: 4.7 mmol/L (ref 3.5–5.1)
Sodium: 135 mmol/L (ref 135–145)
Total Bilirubin: 1.2 mg/dL (ref 0.3–1.2)
Total Protein: 8.1 g/dL (ref 6.5–8.1)

## 2019-03-02 LAB — GLUCOSE, CAPILLARY
Glucose-Capillary: 94 mg/dL (ref 70–99)
Glucose-Capillary: 198 mg/dL — ABNORMAL HIGH (ref 70–99)

## 2019-03-02 LAB — ANTINUCLEAR ANTIBODIES, IFA: ANA Ab, IFA: NEGATIVE

## 2019-03-02 LAB — LACTATE DEHYDROGENASE: LDH: 347 U/L — ABNORMAL HIGH (ref 98–192)

## 2019-03-02 LAB — PROTIME-INR
INR: 3.4 — ABNORMAL HIGH (ref 0.8–1.2)
Prothrombin Time: 34.5 seconds — ABNORMAL HIGH (ref 11.4–15.2)

## 2019-03-02 MED ORDER — FUROSEMIDE 10 MG/ML IJ SOLN
60.0000 mg | Freq: Once | INTRAMUSCULAR | Status: AC
  Administered 2019-03-02: 08:00:00 60 mg via INTRAVENOUS
  Filled 2019-03-02: qty 6

## 2019-03-02 NOTE — Progress Notes (Signed)
NAME:  [name] [name], MRN:  [id number], DOB:  1951/01/14, LOS: 2 ADMISSION DATE:  02/01/2019, CONSULTATION DATE:  02/25/2019 REFERRING MD:  Dr. Aundra Dubin, CHIEF COMPLAINT:  Hypoxia  Brief History   36 yoM with extensive cardiac history with LVAD presenting with acute onset hypoxia possibly concerning for HF exacerbation vs amiodarone toxicity vs unknown process.  COVID negative x 3 thus far.  Pulmonary consulted for further evaluation.    History of present illness   68 year old male with prior history of Chronic HFrEF 2/2 ICM s/p LVAD HM3 placement 08/16/2017 with BiV Medtronic ICD on coumadin, [name], PAD, VT s/p ablation, DMT2, HTN, HLD, and CKD III admitted with acute onset hypoxia and dyspnea.   On chart review, patient was started on amiodarone 200 mg BID after being seen on 12/10/2017 for VT with attempted ATPx4 then requiring cardioversion to NSR.   PFTs 04/27/2017 showing "reduced lung volumes, increased FEV1/FVC ratio and diffusion defect suggest an interstitial process such as fibrosis or interstitial inflammation. Minimal airway obstruction is present. In view of the severity of the diffusion defect, studies with exercise would be helpful to evaluate the presence of hypoxemia."    05/05/2018 RHC >> VAD speed 5100 RA = 5 RV = 44/7 PA = 43/10 (22) PCW = 13 Fick cardiac output/index =4.9/2.3 PVR = 1.9 WU FA sat = 96% PA sat = 68%, 71% Well-compensated hemodynamics with VAD support. RV looks good.  Patient lives at home with his wife.  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  Former smoker, smoked 1 ppd from the age of 84 to 43.  Has lived in the house he built since the 76's.  No exposure to birds or feathered pillows.  No recent travel.  No hot tub exposures.  No prior lung issues or known family autoimmune or lung disorders.  No recent sick or known COVID exposures.  Not on home O2. Reports he is "mostly"  compliant with his dietary restrictions.  Reports being fatigued since having his LVAD but otherwise in decent health without exertional limitations.  Denies chronic cough.   Presents now with increasing 2 week history of weakness, non productive cough, and dyspnea.  Seen in UC one week ago with negative COVID.  Additionally reported some increasing LE edema and orthopnea.  Had near syncopal episode on 12/27 in which patient was evaluated in Er and evaluated by HF team.  Found to have stable rhythm AF with MAPs 100-110, hs trop 6 and BNP 439, CXR with diffuse infiltrates and saturations 99-100 on 2L [place]. He was diuresed with over ~1L UOP and discharged home on patient preference.  Differential included heart failure but given normal functioning VAD, amiodarone toxicity vs COVID included. ESR noted to be 80 therefore amiodarone was stopped and started on prednisone.   He was sent home with increased diuretics and followed back up in HF clinic.    12/29 in HF clinic, he presented weak and confused, found to be hypoxic with room air saturations in the 50's with improvement once on NRB.  He was admitted directly to [facility] ICU by HF team.  Labs worsening LFTs, AKI, BNP 439 (prior 56 08/2017), LDH 471 (trending up since 12/2018 noted to 191), INR 2.9, WBC 10.3, PCT 0.67, CXR again showing diffuse infiltrates. Pulmonary consulted for further recommendations.    Past Medical History  Chronic HFrEF 2/2 ICM s/p LVAD  HM3 placement 08/16/2017 with BiV Medtronic ICD on coumadin, [name], PAD, VT s/p ablation, DMT2, HTN, HLD, CKD III  Significant Hospital Events   12/27 ER evaluation 12/29 Admit  Consults:  Pulmonary   Procedures:    Significant Diagnostic Tests:  12/29 TTE   1. Left ventricular ejection fraction, by visual estimation, is 20 to 25%. The left ventricle has severely decreased function. Left ventricular septal wall thickness was decreased. Mildly increased left ventricular posterior  wall thickness. There is  mildly increased left ventricular wall thickness.  2. Left ventricular diastolic parameters are indeterminate.  3. Global right ventricle has severely reduced systolic function.The right ventricular size is moderately enlarged.  4. Trivial mitral valve regurgitation.  5. Tricuspid valve regurgitation mild-moderate.  6. Tricuspid valve regurgitation mild-moderate.  7. Mildly elevated pulmonary artery systolic pressure.  8. A pacer wire is visualized.  9. The inferior vena cava is dilated in size with <50% respiratory variability, suggesting right atrial pressure of 15 mmHg.   ESR 68 CRP 23.5 ANA Scleroderma>> RA factor>>10.2 ANA IFA>> Anti DNA Antibody>> ANCA>> HSP>> Sjogerns A/B>> ACE>>52  12/30 HRCT Chest 1. Diffuse patchy pulmonary parenchymal ground-glass, scattered consolidation and suspected mild architectural distortion, with a slight upper/midlung zone predominance. Findings are new/progressive from 05/03/2018 and can be seen with amiodarone toxicity. Pneumonia is not excluded. 2. Trace bilateral pleural effusions. 3. Increased prominence of mediastinal lymph nodes, likely reactive. 4. Liver margin may be slightly irregular, raising suspicion for cirrhosis. 5. Gallbladder sludge and/or stones. 6.  Aortic atherosclerosis (ICD10-I70.0). 7. Enlarged pulmonic trunk, indicative of pulmonary arterial hypertension. Micro Data:  12/27 SARS2 >> neg 12/29 RVP >> 12/29 SARS 2 >>  12/29 MRSA PCR >> neg 12/29 BCx 2 >>  12/29 RVP>>  Antimicrobials:  n/a  Interim history/subjective:  Continues to feel better with improved dyspnea O2 requirements slowly improving.  Objective   Blood pressure (!) 80/61, pulse (!) 110, temperature (!) 96 F (35.6 C), temperature source (P) Oral, resp. rate (!) 23, weight 108.3 kg, SpO2 (!) 86 %.        Intake/Output Summary (Last 24 hours) at 03/02/2019 0916 Last data filed at 03/02/2019 0230 Gross per 24  hour  Intake 120 ml  Output 1000 ml  Net -880 ml   Filed Weights   03/01/19 0500 03/02/19 0600  Weight: 107.3 kg 108.3 kg    Examination: Gen:      No acute distress, sitting in the chair at bedside HEENT:  EOMI, sclera anicteric Neck:     No masses; no thyromegaly Lungs:    Clear to auscultation bilaterally; normal respiratory effort CV:         LVAD hum Abd:      + bowel sounds; soft, non-tender; no palpable masses, no distension Ext:    No edema; adequate peripheral perfusion Skin:      Warm and dry; no rash Neuro: alert and oriented x 3  Resolved Hospital Problem list    Assessment & Plan:   Acute hypoxic respiratory failure - multifactorial differential including acute on chronic HF,  +/- ILD process- specifically r/o amiodarone toxicity given elevated ESR.  Less likely infectious process given no infectious predrome, normal WBC, afebrile and reassuring PCT.  Less likely PE given therapeutic INR on coumadin.  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  Can start weaning tomorrow Diuresis per HF team Intermittent CXR  Remainder per primary tream.  PCCM will continue to follow.   Best practice:  Diet: Cardiac  Pain/Anxiety/Delirium protocol (if indicated): n/a VAP protocol (if indicated): n/a DVT prophylaxis: coumadin GI prophylaxis: PPI Glucose control: SSI Code Status: No CPR Family Communication: patient updated on plan of care Disposition: ICU  Labs and imaging for past 24 hours reviewed in EMR     Marshell Garfinkel MD [facility] Please see [email or web address] for pager details.  03/02/2019, 9:19 AM

## 2019-03-02 NOTE — Progress Notes (Signed)
LVAD Coordinator Rounding Note:  Admitted 02/02/2019 per Dr. 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   HM III LVAD implanted on 08/16/17 by Dr. Prescott Gum  under Destination Therapy criteria.  Pt awake, sitting up in the chair, says his "breathing is a little better". Currently on 8 l/min HFNC with sats 90 - 98%.  Vital signs: Temp: 96 HR: 66 Doppler Pressure:  78 Automatic BP:  80/61 (68) O2 Sat: 98% 10 L/min HFNC Wt: 236.5>238.7 lbs   LVAD interrogation reveals:  Speed: 5100 Flow: 4.0 Power:  3.6w PI: 4.0 Alarms: none Events:  2 PI events Hematocrit: 20 - do not adjust  Fixed speed: 5100  Low speed limit: 4800   Drive Line:  Dressing C/D/I; anchor intact and accurately applied. Weekly dressing changes per [facility] nurse; next dressing change due 03/06/19.  Labs:  LDH trend:  [phone]  INR trend: 2.9>3.1>3.4  WBC trend: 10.0>12.8>15.2  Anticoagulation Plan: -INR Goal: 2.0 - 2.5 -ASA Dose: 81 mg daily  Device:Medtronic 3 Lead Therapies: ON:  VF 200 bpm, FVT 200 - 240; VT 158 - 200 - ATP Therapies: AT/AF monitor on 171 bpm Pacing: DDD 60 Last check: 11/10/18  Plan/Recommendations:  1. Page VAD coordinator with any VAD equipment or drive line issues. 2. Weekly dressing changes per BS nurse; next change due 03/06/19.  Tanda Rockers RN, VAD Coordinator VAD Pager: [phone]

## 2019-03-02 NOTE — Progress Notes (Signed)
Patient ID: [name] [name], male   DOB: 10-18-1950, 68 y.o.   MRN: [id number]   Advanced Heart Failure VAD Team Note  PCP-Cardiologist: No primary care provider on file.   Subjective:    Feels better but still with dyspnea.  On 10 L HFNC with saturation high 90s.  Diuresed well with Lasix 60 mg IV x 1 yesterday. Creatinine stable at 1.87.    Afebrile, PCT 0.67 => 0.7.  Respiratory viral panel negative, COVID-19 negative.  Blood cultures NGTD.  ESR 68, CRP 23. Autoimmune panel negative.   LDH 471 => 352 => 347.   MAP stable 70s-80s generally.   High resolution CT chest: New diffuse patchy pulmonary ground glass, slight mid-upper lobe predominance.  ?Amiodarone toxicity or infection.    LVAD INTERROGATION:  HeartMate 3 LVAD:   Flow 4.2 liters/min, speed 5100, power 3.5, PI 4.5.  No PI events.  Objective:    Vital Signs:   Temp:  [96.1 F (35.6 C)-97.5 F (36.4 C)] 97.3 F (36.3 C) (12/31 0428) Pulse Rate:  [29-118] 110 (12/31 0700) Resp:  [9-30] 23 (12/31 0700) BP: (80-104)/(61-88) 80/61 (12/31 0600) SpO2:  [86 %-100 %] 86 % (12/31 0700) Weight:  [108.3 kg] 108.3 kg (12/31 0600) Last BM Date: 02/25/2019 Mean arterial Pressure 80s-91  Intake/Output:   Intake/Output Summary (Last 24 hours) at 03/02/2019 0713 Last data filed at 03/02/2019 0230 Gross per 24 hour  Intake 360 ml  Output 1350 ml  Net -990 ml     Physical Exam    General: Well appearing this am. NAD.  HEENT: Normal. Neck: Supple, JVP 8-9 cm cm. Carotids OK.  Cardiac:  Mechanical heart sounds with LVAD hum present.  Lungs:  Crackles bilaterally.  Abdomen:  NT, ND, no HSM. No bruits or masses. +BS  LVAD exit site: Well-healed and incorporated. Dressing dry and intact. No erythema or drainage. Stabilization device present and accurately applied. Driveline dressing changed daily per sterile technique. Extremities:  Warm and dry. No cyanosis, clubbing, rash.  1+ ankle edema.  Neuro:  Alert & oriented x 3.  Cranial nerves grossly intact. Moves all 4 extremities w/o difficulty. Affect pleasant     Telemetry   NSR 60s with PVCs (personally reviewed)  Labs   Basic Metabolic Panel: Recent Labs  Lab 02/26/19 1349 02/02/2019 0955 02/08/2019 1022 03/01/19 0415 03/02/19 0132  NA 130* 131* 131* 132* 135  K 4.1 4.1 4.1 3.6 4.7  CL 97* 95* 94* 95* 95*  CO2 23 17* '22 23 27  ' GLUCOSE 274* 264* 263* 235* 127*  BUN 32* 48* 47* 52* 57*  CREATININE 2.00* 2.52* 2.34* 1.80* 1.87*  CALCIUM 8.4* 8.8* 8.5* 8.5* 9.0    Liver Function Tests: Recent Labs  Lab 02/26/19 1349 02/26/2019 0955 02/21/2019 1022 03/02/19 0132  AST 47* 161* 147* 63*  ALT 37 DUPLICATE REQUEST 177* 97*  ALKPHOS 106 92 88 94  BILITOT 1.2 1.9* 1.4* 1.2  PROT 8.6* 8.9* 8.2* 8.1  ALBUMIN 2.5* 2.5* 2.3* 2.2*   No results for input(s): LIPASE, AMYLASE in the last 168 hours. No results for input(s): AMMONIA in the last 168 hours.  CBC: Recent Labs  Lab 02/26/19 1349 02/06/2019 0955 02/27/2019 1022 03/01/19 0415 03/02/19 0132  WBC 8.2 10.0 10.3 12.8* 15.2*  NEUTROABS 6.7  --   --   --   --   HGB 10.3* 11.0* 10.2* 10.3* 12.3*  HCT 33.0* 36.0* 31.2* 31.5* 38.9*  MCV 86.2 86.5 81.9 80.6 82.8  PLT 236  308 274 284 346    INR: Recent Labs  Lab 02/26/19 1349 02/09/2019 0955 02/03/2019 1122 03/01/19 0415 03/02/19 0132  INR 2.3* 3.0* 2.9* 3.1* 3.4*    Other results:  EKG:    Imaging   CT Chest High Resolution  Result Date: 03/01/2019 CLINICAL DATA:  Shortness of breath on exertion, amiodarone toxicity suspected. EXAM: CT CHEST WITHOUT CONTRAST TECHNIQUE: Multidetector CT imaging of the chest was performed following the standard protocol without intravenous contrast. High resolution imaging of the lungs, as well as inspiratory and expiratory imaging, was performed. COMPARISON:  05/03/2018, 04/21/2017. FINDINGS: Cardiovascular: Atherosclerotic calcification of the aorta. Pulmonic trunk and heart are enlarged. Left ventricular  assist device is seen at the left ventricular apex, as before. No pericardial effusion. Mediastinum/Nodes: Mediastinal lymph nodes are slightly more prominent than on the prior exam, measuring up to 12 mm in the low right paratracheal station, previously 9 mm. Hilar regions are difficult to assess without IV contrast. No axillary adenopathy. There may be distal esophageal wall thickening which can be seen with gastroesophageal reflux. Lungs/Pleura: Image quality is degraded by respiratory motion. Slightly upper and midlung zone predominant patchy ground-glass, scattered consolidation and septal thickening. Suspect mild associated architectural distortion, especially when compared with 05/03/2018. Trace bilateral pleural effusions with fluid loculated in the right major fissure. No air trapping. Airway is unremarkable. Upper Abdomen: Liver margin may be slightly irregular. High attenuation sludge and/or stones in the gallbladder. Visualized portions of the adrenal glands, kidneys, spleen, pancreas, stomach and bowel are otherwise grossly unremarkable. No upper abdominal adenopathy. Musculoskeletal: Degenerative changes in the spine. No worrisome lytic or sclerotic lesions. IMPRESSION: 1. Diffuse patchy pulmonary parenchymal ground-glass, scattered consolidation and suspected mild architectural distortion, with a slight upper/midlung zone predominance. Findings are new/progressive from 05/03/2018 and can be seen with amiodarone toxicity. Pneumonia is not excluded. 2. Trace bilateral pleural effusions. 3. Increased prominence of mediastinal lymph nodes, likely reactive. 4. Liver margin may be slightly irregular, raising suspicion for cirrhosis. 5. Gallbladder sludge and/or stones. 6.  Aortic atherosclerosis (ICD10-I70.0). 7. Enlarged pulmonic trunk, indicative of pulmonary arterial hypertension. Electronically Signed   By: Lorin Picket M.D.   On: 03/01/2019 08:32   DG Chest Port 1 View  Result Date:  02/16/2019 CLINICAL DATA:  Shortness of breath, CHF EXAM: PORTABLE CHEST 1 VIEW COMPARISON:  02/26/2011 FINDINGS: The left AICD and LVAD remain in place, unchanged. Cardiomegaly. Diffuse interstitial and airspace opacities are again noted, unchanged. No visible effusions. No acute bony abnormality. IMPRESSION: Diffuse bilateral interstitial and airspace disease could reflect edema or infection. No change. Electronically Signed   By: Rolm Baptise M.D.   On: 02/27/2019 10:00   ECHOCARDIOGRAM LIMITED  Result Date: 02/12/2019   ECHOCARDIOGRAM LIMITED REPORT   Patient Name:   VLADISLAV AXELSON Date of Exam: 02/16/2019 Medical Rec #:  [id number]         Height:       70.0 in Accession #:    [id number]        Weight:       241.2 lb Date of Birth:  29-Jul-1950         BSA:          2.26 m Patient Age:    70 years          BP:           105/89 mmHg Patient Gender: M  HR:           60 bpm. Exam Location:  Inpatient  Procedure: 2D Echo Indications:    CHF-Acute Systolic  History:        Patient has prior history of Echocardiogram examinations, most                 recent 04/29/2018. Ischemic Cardiomyopathy, Chronic renal                 disease, stage III , S/P TVR (tricuspid valve repair).  Sonographer:    Leavy Cella Referring Phys: [place]  1. Left ventricular ejection fraction, by visual estimation, is 20 to 25%. The left ventricle has severely decreased function. Left ventricular septal wall thickness was decreased. Mildly increased left ventricular posterior wall thickness. There is mildly increased left ventricular wall thickness.  2. Left ventricular diastolic parameters are indeterminate.  3. Global right ventricle has severely reduced systolic function.The right ventricular size is moderately enlarged.  4. Trivial mitral valve regurgitation.  5. Tricuspid valve regurgitation mild-moderate.  6. Tricuspid valve regurgitation mild-moderate.  7. Mildly elevated pulmonary  artery systolic pressure.  8. A pacer wire is visualized.  9. The inferior vena cava is dilated in size with <50% respiratory variability, suggesting right atrial pressure of 15 mmHg. FINDINGS  Left Ventricle: Left ventricular ejection fraction, by visual estimation, is 20 to 25%. The left ventricle has severely decreased function. Left ventricular septal wall thickness was decreased. Mildly increased left ventricular posterior wall thickness.  There is mildly increased left ventricular wall thickness. Left ventricular diastolic parameters are indeterminate. Right Ventricle: The right ventricular size is moderately enlarged. Global RV systolic function is has severely reduced systolic function. The tricuspid regurgitant velocity is 2.32 m/s, and with an assumed right atrial pressure of 15 mmHg, the estimated  right ventricular systolic pressure is mildly elevated at 36.5 mmHg. Mitral Valve: MV Area by PHT, 4.39 cm. MV PHT, 50.17 msec. Trivial mitral valve regurgitation. Tricuspid Valve: Tricuspid valve regurgitation mild-moderate. Aortic Valve: There is mild thickening of the aortic valve. There is mild calcification of the aortic valve. Venous: The inferior vena cava is dilated in size with less than 50% respiratory variability, suggesting right atrial pressure of 15 mmHg. Additional Comments: A pacer wire is visualized.  LEFT VENTRICLE         Normals PLAX 2D LVIDd:         5.40 cm 3.6 cm LVIDs:         4.90 cm 1.7 cm LV PW:         1.30 cm 1.4 cm LV IVS:        1.20 cm 1.3 cm LV SV:         28 ml   79 ml LV SV Index:   12.06   45 ml/m2  LEFT ATRIUM         Index LA diam:    5.20 cm 2.30 cm/m   AORTA                 Normals Ao Root diam: 3.10 cm 31 mm MITRAL VALVE              Normals   TRICUSPID VALVE             Normals MV Area (PHT): 4.39 cm             TR Peak grad:   21.5 mmHg MV PHT:  50.17 msec 55 ms     TR Vmax:        232.00 cm/s 288 cm/s MV Decel Time: 173 msec   187 ms MV E velocity: 75.40 cm/s  103 cm/s MV A velocity: 29.60 cm/s 70.3 cm/s MV E/A ratio:  2.55       1.5  Skeet Latch MD Electronically signed by Skeet Latch MD Signature Date/Time: 02/25/2019/5:22:20 PM    Final      Medications:     Scheduled Medications: . aspirin  81 mg Oral Daily  . Chlorhexidine Gluconate Cloth  6 each Topical Daily  . furosemide  60 mg Intravenous Once  . gabapentin  600 mg Oral BID  . hydrALAZINE  25 mg Oral Q8H  . insulin aspart  0-9 Units Subcutaneous TID WC  . insulin aspart protamine- aspart  40 Units Subcutaneous Q breakfast  . magnesium oxide  400 mg Oral BID  . mouth rinse  15 mL Mouth Rinse BID  . methylPREDNISolone (SOLU-MEDROL) injection  60 mg Intravenous Q6H  . pantoprazole  40 mg Oral Daily  . Warfarin - Pharmacist Dosing Inpatient   Does not apply q1800    Infusions:   PRN Medications: acetaminophen, ondansetron (ZOFRAN) IV   Assessment/Plan:    1. Acute hypoxemic respiratory failure: Recently seen in the ER and now admitted from clinic with marked hypoxemic requiring NRBM.  CXR with diffuse bilateral interstitial and airspace disease.  12/27 ESR elevated to 80 => 68 this admission with CRP 23.  PCT elevated mildly 0.67 => 0.7.  Respiratory virus and COVID-19 negative.  Blood cultures NGTD.  Afebrile, WBCs 15 on steroids.  Also noted to be volume overloaded on exam with significant JVD, weight gain, and peripheral edema. High resolution CT chest with diffuse GGO that could be consistent with amiodarone toxicity versus infection.  Autoimmune panel negative.  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  Amiodarone has been stopped and he has been started on IV Solumedrol.  Will follow CCM's direction regarding steroid dosing.  - Wean oxygen down to 8L now.  Can continue to wean as tolerated (transfer to [place] if he can get down to 6L [place]).  - Volume status looks better on exam today, some diuresis yesterday.   Creatinine stable at 1.87 though BUN higher.  Will give 1 more daily Lasix 60 mg IV x 1.    - Infectious cause less likely though PCT is mildly elevated.  No fever.  Have sent blood/sputum cultures and respiratory virus panel, all negative so far. Negative for COVID-19 again.   - Appreciate pulmonary assistance.   2. 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  MDT CRT-D device.  Mild volume overload on exam, improved. LVAD parameters stable on interrogation. He diuresed some yesterday with IV Lasix, creatinine stable at 1.87 though BUN higher.  - Lasix 60 mg IV x 1 again for 1 more day.  - INR goal 2-2.5 with LVAD, continue ASA 81 and adjust warfarin.  - LDH higher at 475 at admission but with acute inflammatory disease, suspect pump thrombus formation less likely.  Keep INR therapeutic. LDH down to 347 today.  3. Atrial fibrillation: Paroxysmal.  NSR today.  4. 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  5. AKI on CKD stage 3: Creatinine up to 2.34 at admit from baseline around 1.4 a couple of months ago.  However, he looked volume overloaded at admission with significant JVD.  Creatinine stable at 1.87. - Will give another dose of IV Lasix as above. Follow creatinine closely.  - Hold losartan, will use hydralazine 25 mg tid for BP control.  6. Diabetes: Blood glucose up with steroids.  - Continue SSI - Started back on home insulin 70/30.   I reviewed the LVAD parameters from today, and compared the results to the patient's prior recorded data.  No programming changes were made.  The LVAD is functioning within specified parameters.  The patient performs LVAD self-test daily.  LVAD interrogation was negative for any significant power changes, alarms or PI events/speed drops.  LVAD equipment check completed and is in good working order.  Back-up equipment present.   LVAD education done on emergency procedures and precautions and reviewed exit site care.  Length of Stay: 2  Loralie Champagne, MD 03/02/2019, 7:13 AM  VAD Team --- VAD ISSUES ONLY--- Pager [phone] (7am - 7am)  Advanced Heart Failure Team  Pager [phone] (M-F; 7a - 4p)  Please contact [facility] for night-coverage after hours (4p -7a ) and weekends on [email or web address]

## 2019-03-02 NOTE — Progress Notes (Signed)
ANTICOAGULATION CONSULT NOTE - [place] for warfarin Indication: atrial fibrillation/LVAD  Allergies  Allergen Reactions  . Penicillins Hives, Itching and Other (See Comments)    Did it involve swelling of the face/tongue/throat, SOB, or low BP? No Did it involve sudden or severe rash/hives, skin peeling, or any reaction on the inside of your mouth or nose? No Did you need to seek medical attention at a hospital or doctor's office? Unknown When did it last happen?childhood allergy If all above answers are "NO", may proceed with cephalosporin use.    Marland Kitchen Alcohol-Sulfur [Sulfur] Other (See Comments)    Burns skin    Patient Measurements: Weight: 238 lb 12.1 oz (108.3 kg)  Vital Signs: Temp: 96 F (35.6 C) (12/31 0700) Temp Source: (P) Oral (12/31 0700) BP: 80/61 (12/31 0600) Pulse Rate: 110 (12/31 0700)  Labs: Recent Labs    02/17/2019 0955 02/26/2019 1022 02/10/2019 1122 03/01/19 0415 03/02/19 0132  HGB   < > 10.2*  --  10.3* 12.3*  HCT  --  31.2*  --  31.5* 38.9*  PLT  --  274  --  284 346  LABPROT  --   --  30.1* 31.8* 34.5*  INR  --   --  2.9* 3.1* 3.4*  CREATININE  --  2.34*  --  1.80* 1.87*   < > = values in this interval not displayed.    Estimated Creatinine Clearance: 46.6 mL/min (A) (by C-G formula based on SCr of 1.87 mg/dL (H)).   Medical History: Past Medical History:  Diagnosis Date  . AICD (automatic cardioverter/defibrillator) present 02/05/2014   Upgrade to Medtronic biventricular ICD, serial number  [id number]   . Atrial flutter ([place]) 04/2012   s/p TEE-EPS+RFCA 04/2012  . CAD (coronary artery disease) [id number] X 2    RCA-T, 70% PL (off CFX), 99% Prox LAD/90% Dist LAD, S/P TAXUS stent x 2  . CHF (congestive heart failure) ([place])   . Chronic anticoagulation   . Chronic systolic heart failure ([place])   . CKD (chronic kidney disease)   . Diabetic retinopathy ([place])   . DM type 2 (diabetes mellitus, type 2) (HCC)    insulin dependent  . HTN (hypertension)   . Hypercholesteremia    ablation  . ICD (implantable cardiac defibrillator) in place   . Ischemic cardiomyopathy March 2015   20-25% 2D   . Nephrolithiasis   . 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 INR above goal at 3.4, CBC and LDH stable.  *PTA Warfarin dose= 2.5mg  daily  Goal of Therapy:  INR 2.0-2.5 Monitor platelets by anticoagulation protocol: Yes   Plan:  -Hold warfarin tonight -Daily INR  [name], PharmD, [facility] PGY2 Cardiology Pharmacy Resident Phone [phone] 03/02/2019       10:13 AM  Please check [email or web address] for unit-specific pharmacist phone numbers

## 2019-03-02 NOTE — Progress Notes (Signed)
CARDIAC REHAB PHASE I   PRE:  Rate/Rhythm: 50 SR with PVCs    BP: sitting 76 map    SaO2: 86-91 7L HF after BR  MODE:  Ambulation: 110 ft   POST:  Rate/Rhythm: 70 SR with many PVCs    BP: sitting 80 map     SaO2: 75 8L, up to 92 9L HF after 5 min rest  Pt in bathroom, wife bathing on my arrival. Took O2 off for a few minutes. Walked to chair with RW and rested before ambulating hall. SaO2 hard to register, eventually got pleth on his forehead with [place]. Able to stand with min assist and walk with RW, 8L. Slow and steady, c/o some weakness, no major SOB. On return SaO2 in 70s. Put back to HFNC 7L then increased to 9L. Pt to up 92 after about 5 min. Used IS, 289 146 3276 mL, which makes him cough but "feels good". Encouraged more use. Will f/u on Saturday.  [place], ACSM 03/02/2019 3:17 PM

## 2019-03-03 LAB — PROTIME-INR
INR: 4 — ABNORMAL HIGH (ref 0.8–1.2)
Prothrombin Time: 38.7 seconds — ABNORMAL HIGH (ref 11.4–15.2)

## 2019-03-03 LAB — COMPREHENSIVE METABOLIC PANEL
ALT: 80 U/L — ABNORMAL HIGH (ref 0–44)
AST: 46 U/L — ABNORMAL HIGH (ref 15–41)
Albumin: 2.1 g/dL — ABNORMAL LOW (ref 3.5–5.0)
Alkaline Phosphatase: 99 U/L (ref 38–126)
Anion gap: 10 (ref 5–15)
BUN: 57 mg/dL — ABNORMAL HIGH (ref 8–23)
CO2: 27 mmol/L (ref 22–32)
Calcium: 8.7 mg/dL — ABNORMAL LOW (ref 8.9–10.3)
Chloride: 96 mmol/L — ABNORMAL LOW (ref 98–111)
Creatinine, Ser: 1.78 mg/dL — ABNORMAL HIGH (ref 0.61–1.24)
GFR calc Af Amer: 44 mL/min — ABNORMAL LOW (ref >60)
GFR calc non Af Amer: 38 mL/min — ABNORMAL LOW (ref >60)
Glucose, Bld: 198 mg/dL — ABNORMAL HIGH (ref 70–99)
Potassium: 4.4 mmol/L (ref 3.5–5.1)
Sodium: 133 mmol/L — ABNORMAL LOW (ref 135–145)
Total Bilirubin: 1 mg/dL (ref 0.3–1.2)
Total Protein: 7.3 g/dL (ref 6.5–8.1)

## 2019-03-03 LAB — CBC
HCT: 36.9 % — ABNORMAL LOW (ref 39.0–52.0)
Hemoglobin: 11.8 g/dL — ABNORMAL LOW (ref 13.0–17.0)
MCH: 26 pg (ref 26.0–34.0)
MCHC: 32 g/dL (ref 30.0–36.0)
MCV: 81.3 fL (ref 80.0–100.0)
Platelets: 322 10*3/uL (ref 150–400)
RBC: 4.54 MIL/uL (ref 4.22–5.81)
RDW: 17.8 % — ABNORMAL HIGH (ref 11.5–15.5)
WBC: 12.4 10*3/uL — ABNORMAL HIGH (ref 4.0–10.5)
nRBC: 1 % — ABNORMAL HIGH (ref 0.0–0.2)

## 2019-03-03 LAB — LACTATE DEHYDROGENASE: LDH: 347 U/L — ABNORMAL HIGH (ref 98–192)

## 2019-03-03 MED ORDER — METHYLPREDNISOLONE SODIUM SUCC 125 MG IJ SOLR
60.0000 mg | Freq: Two times a day (BID) | INTRAMUSCULAR | Status: AC
  Administered 2019-03-03 – 2019-03-04 (×3): 60 mg via INTRAVENOUS
  Filled 2019-03-03 (×4): qty 2

## 2019-03-03 NOTE — Progress Notes (Addendum)
NAME:  [name] [name], MRN:  [id number], DOB:  17-Sep-1950, LOS: 3 ADMISSION DATE:  02/25/2019, CONSULTATION DATE:  02/22/2019 REFERRING MD:  Dr. Aundra Dubin, CHIEF COMPLAINT:  Hypoxia  Brief History   89 yoM with extensive cardiac history with LVAD presenting with acute onset hypoxia possibly concerning for HF exacerbation vs amiodarone toxicity vs unknown process.  COVID negative x 3 thus far.  Pulmonary consulted for further evaluation.    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   On chart review, patient was started on amiodarone 200 mg BID after being seen on 12/10/2017 for VT with attempted ATPx4 then requiring cardioversion to NSR.   PFTs 04/27/2017 showing "reduced lung volumes, increased FEV1/FVC ratio and diffusion defect suggest an interstitial process such as fibrosis or interstitial inflammation. Minimal airway obstruction is present. In view of the severity of the diffusion defect, studies with exercise would be helpful to evaluate the presence of hypoxemia."    05/05/2018 RHC >> VAD speed 5100 RA = 5 RV = 44/7 PA = 43/10 (22) PCW = 13 Fick cardiac output/index =4.9/2.3 PVR = 1.9 WU FA sat = 96% PA sat = 68%, 71% Well-compensated hemodynamics with VAD support. RV looks good.  Patient lives at home with his wife.  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  Former smoker, smoked 1 ppd from the age of 65 to 68.  Has lived in the house he built since the 14's.  No exposure to birds or feathered pillows.  No recent travel.  No hot tub exposures.  No prior lung issues or known family autoimmune or lung disorders.  No recent sick or known COVID exposures.  Not on home O2. Reports he is "mostly"  compliant with his dietary restrictions.  Reports being fatigued since having his LVAD but otherwise in decent health without exertional limitations.  Denies chronic cough.   Presents now with increasing 2 week history of weakness, non productive cough, and dyspnea.  Seen in UC one week ago with negative COVID.  Additionally reported some increasing LE edema and orthopnea.  Had near syncopal episode on 12/27 in which patient was evaluated in Er and evaluated by HF team.  Found to have stable rhythm AF with MAPs 100-110, hs trop 6 and BNP 439, CXR with diffuse infiltrates and saturations 99-100 on 2L [place]. He was diuresed with over ~1L UOP and discharged home on patient preference.  Differential included heart failure but given normal functioning VAD, amiodarone toxicity vs COVID included. ESR noted to be 80 therefore amiodarone was stopped and started on prednisone.   He was sent home with increased diuretics and followed back up in HF clinic.    12/29 in HF clinic, he presented weak and confused, found to be hypoxic with room air saturations in the 50's with improvement once on NRB.  He was admitted directly to [facility] ICU by HF team.  Labs worsening LFTs, AKI, BNP 439 (prior 56 08/2017), LDH 471 (trending up since 12/2018 noted to 191), INR 2.9, WBC 10.3, PCT 0.67, CXR again showing diffuse infiltrates. Pulmonary consulted for further recommendations.    Past Medical History  Chronic HFrEF 2/2 ICM s/p LVAD  HM3 placement 08/16/2017 with BiV Medtronic ICD on coumadin, [name], PAD, VT s/p ablation, DMT2, HTN, HLD, CKD III  Significant Hospital Events   12/27 ER evaluation 12/29 Admit  Consults:  Pulmonary   Procedures:    Significant Diagnostic Tests:  12/29 TTE   1. Left ventricular ejection fraction, by visual estimation, is 20 to 25%. The left ventricle has severely decreased function. Left ventricular septal wall thickness was decreased. Mildly increased left ventricular posterior  wall thickness. There is  mildly increased left ventricular wall thickness.  2. Left ventricular diastolic parameters are indeterminate.  3. Global right ventricle has severely reduced systolic function.The right ventricular size is moderately enlarged.  4. Trivial mitral valve regurgitation.  5. Tricuspid valve regurgitation mild-moderate.  6. Tricuspid valve regurgitation mild-moderate.  7. Mildly elevated pulmonary artery systolic pressure.  8. A pacer wire is visualized.  9. The inferior vena cava is dilated in size with <50% respiratory variability, suggesting right atrial pressure of 15 mmHg.   ESR 68 CRP 23.5 ANA Scleroderma>> RA factor>>10.2 ANA IFA>> Anti DNA Antibody>> ANCA>> HSP>> Sjogerns A/B>> ACE>>52  12/30 HRCT Chest 1. Diffuse patchy pulmonary parenchymal ground-glass, scattered consolidation and suspected mild architectural distortion, with a slight upper/midlung zone predominance. Findings are new/progressive from 05/03/2018 and can be seen with amiodarone toxicity. Pneumonia is not excluded. 2. Trace bilateral pleural effusions. 3. Increased prominence of mediastinal lymph nodes, likely reactive. 4. Liver margin may be slightly irregular, raising suspicion for cirrhosis. 5. Gallbladder sludge and/or stones. 6.  Aortic atherosclerosis (ICD10-I70.0). 7. Enlarged pulmonic trunk, indicative of pulmonary arterial hypertension. Micro Data:  12/27 SARS2 >> neg 12/29 RVP >> 12/29 SARS 2 >>  12/29 MRSA PCR >> neg 12/29 BCx 2 >>  12/29 RVP>>  Antimicrobials:  n/a  Interim history/subjective:  No issues. States thate breathing is better  Objective   Blood pressure 99/82, pulse 62, temperature 98.3 F (36.8 C), temperature source Oral, resp. rate (!) 21, weight 108.3 kg, SpO2 (!) 80 %.        Intake/Output Summary (Last 24 hours) at 03/03/2019 0754 Last data filed at 03/03/2019 0100 Gross per 24 hour  Intake 600 ml  Output 1975 ml  Net -1375 ml    Filed Weights   03/01/19 0500 03/02/19 0600  Weight: 107.3 kg 108.3 kg    Examination: Gen:      No acute distress HEENT:  EOMI, sclera anicteric Neck:     No masses; no thyromegaly Lungs:    Clear to auscultation bilaterally; normal respiratory effort CV:        LVAD Hum Abd:      + bowel sounds; soft, non-tender; no palpable masses, no distension Ext:    No edema; adequate peripheral perfusion Skin:      Warm and dry; no rash Neuro: alert and oriented x 3 Psych: normal mood and affect  Resolved Hospital Problem list    Assessment & Plan:   Acute hypoxic respiratory failure - multifactorial differential including acute on chronic HF,  +/- ILD process- specifically r/o amiodarone toxicity given elevated ESR.  Less likely infectious process given no infectious predrome, normal WBC, afebrile and reassuring PCT.  Less likely PE given therapeutic INR on coumadin.  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 Reduce dose of Solu-Medrol  to 60 mg every 12. Condition to prednisone tomorrow Diuresis per HF team Intermittent CXR  Remainder per primary tream.  PCCM will continue to follow.   Labs and imaging for past 24 hours reviewed in EMR     Marshell Garfinkel MD [facility] Please see [email or web address] for pager details.  03/03/2019, 7:54 AM

## 2019-03-03 NOTE — Progress Notes (Signed)
[place] Progress Note Patient Name: [name] [name] DOB: 05-17-1950 MRN: [id number]   Date of Service  03/03/2019  HPI/Events of Note  Hypoxia - Now on HFNC with sat = 85-91%.  eICU Interventions  Will order: 1. BiPAP - initial settings IPAP 12, EPAP 5, Keep sats >= 90%.     Intervention Category Major Interventions: Hypoxemia - evaluation and management  [place] Canty Eugene 03/03/2019, 8:16 PM

## 2019-03-03 NOTE — Progress Notes (Signed)
ANTICOAGULATION CONSULT NOTE - [place] for warfarin Indication: atrial fibrillation/LVAD  Allergies  Allergen Reactions  . Penicillins Hives, Itching and Other (See Comments)    Did it involve swelling of the face/tongue/throat, SOB, or low BP? No Did it involve sudden or severe rash/hives, skin peeling, or any reaction on the inside of your mouth or nose? No Did you need to seek medical attention at a hospital or doctor's office? Unknown When did it last happen?childhood allergy If all above answers are "NO", may proceed with cephalosporin use.    Marland Kitchen Alcohol-Sulfur [Sulfur] Other (See Comments)    Burns skin    Patient Measurements: Weight: 238 lb 12.1 oz (108.3 kg)  Vital Signs: Temp: 98.2 F (36.8 C) (01/01 0800) Temp Source: Oral (01/01 0800) BP: 81/58 (01/01 0800) Pulse Rate: 61 (01/01 0800)  Labs: Recent Labs    03/01/19 0415 03/02/19 0132 03/03/19 0222  HGB 10.3* 12.3* 11.8*  HCT 31.5* 38.9* 36.9*  PLT 284 346 322  LABPROT 31.8* 34.5* 38.7*  INR 3.1* 3.4* 4.0*  CREATININE 1.80* 1.87* 1.78*    Estimated Creatinine Clearance: 48.9 mL/min (A) (by C-G formula based on SCr of 1.78 mg/dL (H)).   Medical History: Past Medical History:  Diagnosis Date  . AICD (automatic cardioverter/defibrillator) present 02/05/2014   Upgrade to Medtronic biventricular ICD, serial number  [id number]   . Atrial flutter ([place]) 04/2012   s/p TEE-EPS+RFCA 04/2012  . CAD (coronary artery disease) [id number] X 2    RCA-T, 70% PL (off CFX), 99% Prox LAD/90% Dist LAD, S/P TAXUS stent x 2  . CHF (congestive heart failure) ([place])   . Chronic anticoagulation   . Chronic systolic heart failure ([place])   . CKD (chronic kidney disease)   . Diabetic retinopathy ([place])   . DM type 2 (diabetes mellitus, type 2) (HCC)    insulin dependent  . HTN (hypertension)   . Hypercholesteremia    ablation  . ICD (implantable cardiac defibrillator) in place   . Ischemic  cardiomyopathy March 2015   20-25% 2D   . Nephrolithiasis   . 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 INR above goal at 4, CBC and LDH stable.  *PTA Warfarin dose= 2.5mg  daily  Goal of Therapy:  INR 2.0-2.5 Monitor platelets by anticoagulation protocol: Yes   Plan:  -Hold warfarin tonight -Daily INR  [name], PharmD, [facility] PGY2 Cardiology Pharmacy Resident Phone [phone] 03/03/2019       9:10 AM  Please check [email or web address] for unit-specific pharmacist phone numbers

## 2019-03-03 NOTE — Progress Notes (Addendum)
Upon assessment Pt 02 Sat 80-86% on 11 L HFNC. RN elevated Pt to 45 degrees  and placed HFNC at 14L. Pt 02 sat 87-88% currently. CCM notified. Orders given for Bipap- 02 Sats now 90-100%.   @0130 - Pt nose bleeding while on Bipap- INR 4 as of 03/03/19. CCM Dr. Oletta Darter- notified see new orders

## 2019-03-03 NOTE — Progress Notes (Signed)
Patient ID: [name] [name], male   DOB: 05-29-1950, 69 y.o.   MRN: [id number]   Advanced Heart Failure VAD Team Note  PCP-Cardiologist: No primary care provider on file.   Subjective:    He remains on 14 L HFNC overnight with oxygen saturation mid 90s this morning, had been weaned down to 8 yesterday during the day but increased overnight while sleeping.  Diuresed well with Lasix 60 mg IV x 1 yesterday. Creatinine lower at 1.78.  MAP lower today at 72 when dopplered.     Afebrile, PCT 0.67 => 0.7.  Respiratory viral panel negative, COVID-19 negative.  Blood cultures NGTD.  ESR 68, CRP 23. Autoimmune panel negative.   LDH 471 => 352 => 347 => 347.   High resolution CT chest: New diffuse patchy pulmonary ground glass, slight mid-upper lobe predominance.  ?Amiodarone toxicity or infection.    LVAD INTERROGATION:  HeartMate 3 LVAD:   Flow 4.3 liters/min, speed 5100, power 3.5, PI 3.3.  2 PI events.  Objective:    Vital Signs:   Temp:  [95.7 F (35.4 C)-98.3 F (36.8 C)] 98.2 F (36.8 C) (01/01 0800) Pulse Rate:  [36-86] 61 (01/01 0800) Resp:  [15-23] 23 (01/01 0800) BP: (72-113)/(47-99) 81/58 (01/01 0800) SpO2:  [75 %-94 %] 91 % (01/01 0800) Last BM Date: 02/18/2019 Mean arterial Pressure 72 by doppler  Intake/Output:   Intake/Output Summary (Last 24 hours) at 03/03/2019 0912 Last data filed at 03/03/2019 0800 Gross per 24 hour  Intake 480 ml  Output 1975 ml  Net -1495 ml     Physical Exam    General: Well appearing this am. NAD.  HEENT: Normal. Neck: Supple, JVP 7-8 cm. Carotids OK.  Cardiac:  Mechanical heart sounds with LVAD hum present.  Lungs:  CTAB, normal effort.  Abdomen:  NT, ND, no HSM. No bruits or masses. +BS  LVAD exit site: Well-healed and incorporated. Dressing dry and intact. No erythema or drainage. Stabilization device present and accurately applied. Driveline dressing changed daily per sterile technique. Extremities:  Warm and dry. No cyanosis, clubbing,  rash, or edema.  Neuro:  Alert & oriented x 3. Cranial nerves grossly intact. Moves all 4 extremities w/o difficulty. Affect pleasant     Telemetry   NSR 60s with PVCs (personally reviewed)  Labs   Basic Metabolic Panel: Recent Labs  Lab 02/02/2019 0955 02/19/2019 1022 03/01/19 0415 03/02/19 0132 03/03/19 0222  NA 131* 131* 132* 135 133*  K 4.1 4.1 3.6 4.7 4.4  CL 95* 94* 95* 95* 96*  CO2 17* _0 GLUCOSE 264* 263* 235* 127* 198*  BUN 48* 47* 52* 57* 57*  CREATININE 2.52* 2.34* 1.80* 1.87* 1.78*  CALCIUM 8.8* 8.5* 8.5* 9.0 8.7*    Liver Function Tests: Recent Labs  Lab 02/26/19 1349 01/31/2019 0955 02/03/2019 1022 03/02/19 0132 03/03/19 0222  AST 47* 161* 147* 63* 46*  ALT 37 DUPLICATE REQUEST 798* 97* 80*  ALKPHOS 106 92 88 94 99  BILITOT 1.2 1.9* 1.4* 1.2 1.0  PROT 8.6* 8.9* 8.2* 8.1 7.3  ALBUMIN 2.5* 2.5* 2.3* 2.2* 2.1*   No results for input(s): LIPASE, AMYLASE in the last 168 hours. No results for input(s): AMMONIA in the last 168 hours.  CBC: Recent Labs  Lab 02/26/19 1349 02/17/2019 0955 02/08/2019 1022 03/01/19 0415 03/02/19 0132 03/03/19 0222  WBC 8.2 10.0 10.3 12.8* 15.2* 12.4*  NEUTROABS 6.7  --   --   --   --   --  HGB 10.3* 11.0* 10.2* 10.3* 12.3* 11.8*  HCT 33.0* 36.0* 31.2* 31.5* 38.9* 36.9*  MCV 86.2 86.5 81.9 80.6 82.8 81.3  PLT 236 308 274 284 346 322    INR: Recent Labs  Lab 03/02/2019 0955 02/25/2019 1122 03/01/19 0415 03/02/19 0132 03/03/19 0222  INR 3.0* 2.9* 3.1* 3.4* 4.0*    Other results:  EKG:    Imaging   No results found.   Medications:     Scheduled Medications: . aspirin  81 mg Oral Daily  . Chlorhexidine Gluconate Cloth  6 each Topical Daily  . gabapentin  600 mg Oral BID  . insulin aspart  0-9 Units Subcutaneous TID WC  . insulin aspart protamine- aspart  40 Units Subcutaneous Q breakfast  . magnesium oxide  400 mg Oral BID  . mouth rinse  15 mL Mouth Rinse BID  . methylPREDNISolone (SOLU-MEDROL)  injection  60 mg Intravenous BID  . pantoprazole  40 mg Oral Daily  . Warfarin - Pharmacist Dosing Inpatient   Does not apply q1800    Infusions:   PRN Medications: acetaminophen, ondansetron (ZOFRAN) IV   Assessment/Plan:    1. Acute hypoxemic respiratory failure: Recently seen in the ER and now admitted from clinic with marked hypoxemic requiring NRBM.  CXR with diffuse bilateral interstitial and airspace disease.  12/27 ESR elevated to 80 => 68 this admission with CRP 23.  PCT elevated mildly 0.67 => 0.7.  Respiratory virus and COVID-19 negative.  Blood cultures NGTD.  Afebrile, WBCs 15 on steroids.  Also noted to be volume overloaded on exam with significant JVD, weight gain, and peripheral edema. High resolution CT chest with diffuse GGO that could be consistent with amiodarone toxicity versus infection.  Autoimmune panel negative.  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  Amiodarone has been stopped and he has been started on IV Solumedrol.  Will follow CCM's direction regarding steroid dosing => bid today.  - Still on high flow oxygen 14 L/min.  Think we should be able to wean him down today - Volume status looks better on exam today, with lower MAP will hold IV Lasix today and probably restart his home diuretic tomorrow.    - Infectious cause less likely though PCT is mildly elevated.  No fever.  Have sent blood/sputum cultures and respiratory virus panel, all negative so far. Negative for COVID-19 again.   - Appreciate pulmonary assistance.   2. 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  MDT CRT-D device.  Mild volume overload on exam, improved. LVAD parameters stable on interrogation. He diuresed again yesterday with IV Lasix, creatinine stable at 1.78.  MAP lower this morning.  - Hold diuretic today, restart home diuretic tomorrow.  - Stop hydralazine for now.  - INR goal 2-2.5 with LVAD, continue ASA 81 and  adjust warfarin.  - LDH higher at 475 at admission but with acute inflammatory disease, suspect pump thrombus formation less likely.  Keep INR therapeutic (too high today). LDH down to 347 today.  3. Atrial fibrillation: Paroxysmal.  NSR today.  4. 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  5. AKI on CKD stage 3: Creatinine up to 2.34 at admit from baseline around 1.4 a couple of months ago.  However, he looked volume overloaded at admission with significant JVD.  Creatinine stable at 1.78. - Hold diuretic today with MAP lower.   6. Diabetes: Blood glucose up with steroids.  - Continue SSI -  Started back on home insulin 70/30.   I reviewed the LVAD parameters from today, and compared the results to the patient's prior recorded data.  No programming changes were made.  The LVAD is functioning within specified parameters.  The patient performs LVAD self-test daily.  LVAD interrogation was negative for any significant power changes, alarms or PI events/speed drops.  LVAD equipment check completed and is in good working order.  Back-up equipment present.   LVAD education done on emergency procedures and precautions and reviewed exit site care.  Length of Stay: 3  Loralie Champagne, MD 03/03/2019, 9:12 AM  VAD Team --- VAD ISSUES ONLY--- Pager [phone] (7am - 7am)  Advanced Heart Failure Team  Pager [phone] (M-F; 7a - 4p)  Please contact [facility] for night-coverage after hours (4p -7a ) and weekends on [email or web address]

## 2019-03-03 DEATH — deceased

## 2019-03-04 ENCOUNTER — Inpatient Hospital Stay (HOSPITAL_COMMUNITY): Payer: PPO

## 2019-03-04 DIAGNOSIS — J849 Interstitial pulmonary disease, unspecified: Secondary | ICD-10-CM

## 2019-03-04 LAB — CBC
HCT: 36 % — ABNORMAL LOW (ref 39.0–52.0)
Hemoglobin: 11.7 g/dL — ABNORMAL LOW (ref 13.0–17.0)
MCH: 26.2 pg (ref 26.0–34.0)
MCHC: 32.5 g/dL (ref 30.0–36.0)
MCV: 80.7 fL (ref 80.0–100.0)
Platelets: 298 10*3/uL (ref 150–400)
RBC: 4.46 MIL/uL (ref 4.22–5.81)
RDW: 17.6 % — ABNORMAL HIGH (ref 11.5–15.5)
WBC: 12.7 10*3/uL — ABNORMAL HIGH (ref 4.0–10.5)
nRBC: 0.8 % — ABNORMAL HIGH (ref 0.0–0.2)

## 2019-03-04 LAB — COMPREHENSIVE METABOLIC PANEL
ALT: 62 U/L — ABNORMAL HIGH (ref 0–44)
AST: 34 U/L (ref 15–41)
Albumin: 2 g/dL — ABNORMAL LOW (ref 3.5–5.0)
Alkaline Phosphatase: 84 U/L (ref 38–126)
Anion gap: 13 (ref 5–15)
BUN: 57 mg/dL — ABNORMAL HIGH (ref 8–23)
CO2: 26 mmol/L (ref 22–32)
Calcium: 8.4 mg/dL — ABNORMAL LOW (ref 8.9–10.3)
Chloride: 93 mmol/L — ABNORMAL LOW (ref 98–111)
Creatinine, Ser: 1.83 mg/dL — ABNORMAL HIGH (ref 0.61–1.24)
GFR calc Af Amer: 43 mL/min — ABNORMAL LOW (ref >60)
GFR calc non Af Amer: 37 mL/min — ABNORMAL LOW (ref >60)
Glucose, Bld: 228 mg/dL — ABNORMAL HIGH (ref 70–99)
Potassium: 4.2 mmol/L (ref 3.5–5.1)
Sodium: 132 mmol/L — ABNORMAL LOW (ref 135–145)
Total Bilirubin: 1.2 mg/dL (ref 0.3–1.2)
Total Protein: 7.2 g/dL (ref 6.5–8.1)

## 2019-03-04 LAB — PROTIME-INR
INR: 3.5 — ABNORMAL HIGH (ref 0.8–1.2)
INR: 3.2 — ABNORMAL HIGH (ref 0.8–1.2)
Prothrombin Time: 34.9 seconds — ABNORMAL HIGH (ref 11.4–15.2)
Prothrombin Time: 32.5 seconds — ABNORMAL HIGH (ref 11.4–15.2)

## 2019-03-04 LAB — LACTATE DEHYDROGENASE: LDH: 360 U/L — ABNORMAL HIGH (ref 98–192)

## 2019-03-04 MED ORDER — PREDNISONE 20 MG PO TABS
60.0000 mg | ORAL_TABLET | Freq: Every day | ORAL | Status: DC
  Administered 2019-03-05 – 2019-03-09 (×5): 60 mg via ORAL
  Filled 2019-03-04 (×2): qty 1
  Filled 2019-03-04 (×2): qty 3
  Filled 2019-03-04: qty 1

## 2019-03-04 MED ORDER — WARFARIN 0.5 MG HALF TABLET
0.5000 mg | ORAL_TABLET | Freq: Once | ORAL | Status: AC
  Administered 2019-03-04: 17:00:00 0.5 mg via ORAL
  Filled 2019-03-04: qty 1

## 2019-03-04 MED ORDER — SODIUM CHLORIDE 0.9% IV SOLUTION: Freq: Once | INTRAVENOUS | Status: DC

## 2019-03-04 MED ORDER — FUROSEMIDE 10 MG/ML IJ SOLN
80.0000 mg | Freq: Once | INTRAMUSCULAR | Status: AC
  Administered 2019-03-04: 10:00:00 80 mg via INTRAVENOUS
  Filled 2019-03-04: qty 8

## 2019-03-04 NOTE — Progress Notes (Signed)
eLink Physician-Brief Progress Note Patient Name: [name] [name] DOB: Aug 25, 1950 MRN: [id number]   Date of Service  03/04/2019  HPI/Events of Note  Nosebleed - Intermittent. Last Hgb = 11.8. Last INR = 4.0. Patient on Warfarin for LVAD. Goal INR = 2-2.5.   eICU Interventions  Will order: 1. Send AM PT/INR now.  2. Pinch nose X 15 minutes then place back on BiPAP.        Ruddy Swire Eugene 03/04/2019, 1:25 AM

## 2019-03-04 NOTE — Progress Notes (Signed)
ABG collected and ran on Istat but is not resulting.  ABG @0450  7.51/35/55/28.8 92%   On BIPAP 10/5 60% RR20

## 2019-03-04 NOTE — Progress Notes (Addendum)
NAME:  [name] [name], MRN:  [id number], DOB:  Feb 13, 1951, LOS: 4 ADMISSION DATE:  02/27/2019, CONSULTATION DATE:  03/02/2019 REFERRING MD:  Dr. Aundra Dubin, CHIEF COMPLAINT:  Hypoxia  Brief History   46 yoM with extensive cardiac history with LVAD presenting with acute onset hypoxia possibly concerning for HF exacerbation vs amiodarone toxicity vs unknown process.  COVID negative x 3 thus far.  Pulmonary consulted for further evaluation.    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   On chart review, patient was started on amiodarone 200 mg BID after being seen on 12/10/2017 for VT with attempted ATPx4 then requiring cardioversion to NSR.   PFTs 04/27/2017 showing "reduced lung volumes, increased FEV1/FVC ratio and diffusion defect suggest an interstitial process such as fibrosis or interstitial inflammation. Minimal airway obstruction is present. In view of the severity of the diffusion defect, studies with exercise would be helpful to evaluate the presence of hypoxemia."    05/05/2018 RHC >> VAD speed 5100 RA = 5 RV = 44/7 PA = 43/10 (22) PCW = 13 Fick cardiac output/index =4.9/2.3 PVR = 1.9 WU FA sat = 96% PA sat = 68%, 71% Well-compensated hemodynamics with VAD support. RV looks good.  Patient lives at home with his wife.  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  Former smoker, smoked 1 ppd from the age of 89 to 47.  Has lived in the house he built since the 19's.  No exposure to birds or feathered pillows.  No recent travel.  No hot tub exposures.  No prior lung issues or known family autoimmune or lung disorders.  No recent sick or known COVID exposures.  Not on home O2. Reports he is "mostly"  compliant with his dietary restrictions.  Reports being fatigued since having his LVAD but otherwise in decent health without exertional limitations.  Denies chronic cough.   Presents now with increasing 2 week history of weakness, non productive cough, and dyspnea.  Seen in UC one week ago with negative COVID.  Additionally reported some increasing LE edema and orthopnea.  Had near syncopal episode on 12/27 in which patient was evaluated in Er and evaluated by HF team.  Found to have stable rhythm AF with MAPs 100-110, hs trop 6 and BNP 439, CXR with diffuse infiltrates and saturations 99-100 on 2L [place]. He was diuresed with over ~1L UOP and discharged home on patient preference.  Differential included heart failure but given normal functioning VAD, amiodarone toxicity vs COVID included. ESR noted to be 80 therefore amiodarone was stopped and started on prednisone.   He was sent home with increased diuretics and followed back up in HF clinic.    12/29 in HF clinic, he presented weak and confused, found to be hypoxic with room air saturations in the 50's with improvement once on NRB.  He was admitted directly to [facility] ICU by HF team.  Labs worsening LFTs, AKI, BNP 439 (prior 56 08/2017), LDH 471 (trending up since 12/2018 noted to 191), INR 2.9, WBC 10.3, PCT 0.67, CXR again showing diffuse infiltrates. Pulmonary consulted for further recommendations.    Past Medical History  Chronic HFrEF 2/2 ICM s/p LVAD  HM3 placement 08/16/2017 with BiV Medtronic ICD on coumadin, [name], PAD, VT s/p ablation, DMT2, HTN, HLD, CKD III  Significant Hospital Events   12/27 ER evaluation 12/29 Admit 1/2 Improved oxygenation to 10L O2  Consults:  Pulmonary   Procedures:    Significant Diagnostic Tests:  12/29 TTE   1. Left ventricular ejection fraction, by visual estimation, is 20 to 25%. The left ventricle has severely decreased function. Left ventricular septal wall thickness was decreased. Mildly  increased left ventricular posterior wall thickness. There is  mildly increased left ventricular wall thickness.  2. Left ventricular diastolic parameters are indeterminate.  3. Global right ventricle has severely reduced systolic function.The right ventricular size is moderately enlarged.  4. Trivial mitral valve regurgitation.  5. Tricuspid valve regurgitation mild-moderate.  6. Tricuspid valve regurgitation mild-moderate.  7. Mildly elevated pulmonary artery systolic pressure.  8. A pacer wire is visualized.  9. The inferior vena cava is dilated in size with <50% respiratory variability, suggesting right atrial pressure of 15 mmHg.   ESR 68 CRP 23.5 ANA Scleroderma>> RA factor>>10.2 ANA IFA>> Anti DNA Antibody>> ANCA>> HSP>> Sjogerns A/B>> ACE>>52  12/30 HRCT Chest 1. Diffuse patchy pulmonary parenchymal ground-glass, scattered consolidation and suspected mild architectural distortion, with a slight upper/midlung zone predominance. Findings are new/progressive from 05/03/2018 and can be seen with amiodarone toxicity. Pneumonia is not excluded. 2. Trace bilateral pleural effusions. 3. Increased prominence of mediastinal lymph nodes, likely reactive. 4. Liver margin may be slightly irregular, raising suspicion for cirrhosis. 5. Gallbladder sludge and/or stones. 6.  Aortic atherosclerosis (ICD10-I70.0). 7. Enlarged pulmonic trunk, indicative of pulmonary arterial hypertension. Micro Data:  12/27 SARS2 >> neg 12/29 RVP >> 12/29 SARS 2 >>  12/29 MRSA PCR >> neg 12/29 BCx 2 >>  12/29 RVP>>  Antimicrobials:  n/a  Interim history/subjective:  No events overnight. Improved oxygenation to 10L O2. Reports improved dyspnea   Objective   Blood pressure (!) 86/75, pulse 60, temperature 97.7 F (36.5 C), temperature source Oral, resp. rate 18, weight 108.3 kg, SpO2 93 %.        Intake/Output Summary (Last 24 hours) at 03/04/2019 0836 Last data filed at 03/04/2019  0800 Gross per 24 hour  Intake --  Output 1125 ml  Net -1125 ml   Filed Weights   03/01/19 0500 03/02/19 0600  Weight: 107.3 kg 108.3 kg   Physical Exam: General: Well-appearing, no acute distress HENT: [place], AT, OP clear, MMM Eyes: EOMI, no scleral icterus Respiratory: Diminished breath sounds bilaterally.  No crackles, wheezing or rales Cardiovascular: LVAD hum GI: BS+, soft, nontender Extremities:1+ pitting edema in lower extremities,-tenderness Neuro: AAO x4, CNII-XII grossly intact Skin: Intact, no rashes or bruising Psych: Normal mood, normal affect  CXR 03/03/18 personally reviewed and interpreted by me: Bilateral patchy opacities unchanged. Left pleural effusion. 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  Less likely infectious process given no infectious predrome, normal WBC, afebrile and reassuring PCT.  Less likely PE given therapeutic INR on coumadin.  RVP and autoimmune panel are negative  Since alternative work-up was negative this is presumed amiodarone toxicity  P:  Continue supplemental O2 for sat goal > 90% and monitor for respiratory distress  Amiodarone stopped 12/27  Continue Solu-Medrol to 60 mg every 12h. Start PO prednisone tomorrow. tomorrow Diuresis per HF team Intermittent CXR  Remainder per primary tream.  PCCM will continue to follow.   Labs and imaging for past 24 hours reviewed in EMR    The patient requires high complexity decision making for assessment and support, frequent evaluation and titration of therapies, application of advanced monitoring technologies and extensive interpretation of multiple databases.   Care Time devoted to patient care services described in this  note is 36 Minutes.    Rodman Pickle, M.D. [facility] Pulmonary/Critical Care Medicine 03/04/2019 8:37 AM   Please see Amion for pager number to reach on-call Pulmonary and Critical Care Team.

## 2019-03-04 NOTE — Progress Notes (Signed)
ANTICOAGULATION CONSULT NOTE - [name] [name] for warfarin Indication: atrial fibrillation/LVAD  Allergies  Allergen Reactions  . Penicillins Hives, Itching and Other (See Comments)    Did it involve swelling of the face/tongue/throat, SOB, or low BP? No Did it involve sudden or severe rash/hives, skin peeling, or any reaction on the inside of your mouth or nose? No Did you need to seek medical attention at a hospital or doctor's office? Unknown When did it last happen?childhood allergy If all above answers are "NO", may proceed with cephalosporin use.    Marland Kitchen Alcohol-Sulfur [Sulfur] Other (See Comments)    Burns skin    Patient Measurements: Weight: 238 lb 12.1 oz (108.3 kg)  Vital Signs: Temp: 97.7 F (36.5 C) (01/02 0818) Temp Source: Oral (01/02 0818) BP: 86/75 (01/02 0800) Pulse Rate: 66 (01/02 0900)  Labs: Recent Labs    03/02/19 0132 03/03/19 0222 03/04/19 0202 03/04/19 0551  HGB 12.3* 11.8* 11.7*  --   HCT 38.9* 36.9* 36.0*  --   PLT 346 322 298  --   LABPROT 34.5* 38.7* 34.9* 32.5*  INR 3.4* 4.0* 3.5* 3.2*  CREATININE 1.87* 1.78* 1.83*  --     Estimated Creatinine Clearance: 47.6 mL/min (A) (by C-G formula based on SCr of 1.83 mg/dL (H)).   Medical History: Past Medical History:  Diagnosis Date  . AICD (automatic cardioverter/defibrillator) present 02/05/2014   Upgrade to Medtronic biventricular ICD, serial number  [id number]   . Atrial flutter ([place]) 04/2012   s/p TEE-EPS+RFCA 04/2012  . CAD (coronary artery disease) [id number] X 2    RCA-T, 70% PL (off CFX), 99% Prox LAD/90% Dist LAD, S/P TAXUS stent x 2  . CHF (congestive heart failure) ([place])   . Chronic anticoagulation   . Chronic systolic heart failure ([place])   . CKD (chronic kidney disease)   . Diabetic retinopathy ([place])   . DM type 2 (diabetes mellitus, type 2) (HCC)    insulin dependent  . HTN (hypertension)   . Hypercholesteremia    ablation  . ICD (implantable  cardiac defibrillator) in place   . Ischemic cardiomyopathy March 2015   20-25% 2D   . Nephrolithiasis   . Ventricular tachycardia (HCC)     Assessment: 89 yoM with LVAD and hx AFib on warfarin admitted with possible amiodarone toxicity. INR above goal at 3.2 after holding 2 doses, CBC and LDH stable. Expecting INR to continue trending down following held doses, will resume warfarin at a low dose today to prevent subtherapeutic levels.   *PTA Warfarin dose= 2.5mg  daily  Goal of Therapy:  INR 2.0-2.5 Monitor platelets by anticoagulation protocol: Yes   Plan:  -Warfarin 0.5 mg x 1 tonight -Daily INR  [name], PharmD, [facility] PGY2 Cardiology Pharmacy Resident Phone [phone] 03/04/2019       10:30 AM  Please check [email or web address] for unit-specific pharmacist phone numbers

## 2019-03-04 NOTE — Progress Notes (Signed)
Patient ID: [name] [name], male   DOB: 09-16-1950, 69 y.o.   MRN: [id number]   Advanced Heart Failure VAD Team Note  PCP-Cardiologist: No primary care provider on file.   Subjective:    He remains on 10 L HFNC overnight with oxygen saturation mid 90s this morning.  Lasix held yesterday with low MAP.  MAP 81 this morning.   Creatinine stable at 1.8 today.   Afebrile, PCT 0.67 => 0.7.  Respiratory viral panel negative, COVID-19 negative.  Blood cultures NGTD.  ESR 68, CRP 23. Autoimmune panel negative.   LDH 471 => 352 => 347 => 347 => 360.   High resolution CT chest: New diffuse patchy pulmonary ground glass, slight mid-upper lobe predominance.  ?Amiodarone toxicity or infection.    LVAD INTERROGATION:  HeartMate 3 LVAD:   Flow 4.1 liters/min, speed 5100, power 3.5, PI 4.2.    Objective:    Vital Signs:   Temp:  [96 F (35.6 C)-98.5 F (36.9 C)] 97.7 F (36.5 C) (01/02 0818) Pulse Rate:  [28-87] 66 (01/02 0900) Resp:  [13-24] 14 (01/02 0900) BP: (73-114)/(55-92) 86/75 (01/02 0800) SpO2:  [70 %-99 %] 88 % (01/02 0900) Last BM Date: 02/08/2019 Mean arterial Pressure 81  Intake/Output:   Intake/Output Summary (Last 24 hours) at 03/04/2019 0950 Last data filed at 03/04/2019 0800 Gross per 24 hour  Intake 240 ml  Output 1125 ml  Net -885 ml     Physical Exam    General: Well appearing this am. NAD.  HEENT: Normal. Neck: Supple, JVP 10-12 cm. Carotids OK.  Cardiac:  Mechanical heart sounds with LVAD hum present.  Lungs:  Crackles bilaterally Abdomen:  NT, ND, no HSM. No bruits or masses. +BS  LVAD exit site: Well-healed and incorporated. Dressing dry and intact. No erythema or drainage. Stabilization device present and accurately applied. Driveline dressing changed daily per sterile technique. Extremities:  Warm and dry. No cyanosis, clubbing, rash, or edema.  Neuro:  Alert & oriented x 3. Cranial nerves grossly intact. Moves all 4 extremities w/o difficulty. Affect  pleasant     Telemetry   NSR 60s with PVCs (personally reviewed)  Labs   Basic Metabolic Panel: Recent Labs  Lab 02/03/2019 1022 03/01/19 0415 03/02/19 0132 03/03/19 0222 03/04/19 0202  NA 131* 132* 135 133* 132*  K 4.1 3.6 4.7 4.4 4.2  CL 94* 95* 95* 96* 93*  CO2 '22 23 27 27 26  ' GLUCOSE 263* 235* 127* 198* 228*  BUN 47* 52* 57* 57* 57*  CREATININE 2.34* 1.80* 1.87* 1.78* 1.83*  CALCIUM 8.5* 8.5* 9.0 8.7* 8.4*    Liver Function Tests: Recent Labs  Lab 02/09/2019 0955 01/31/2019 1022 03/02/19 0132 03/03/19 0222 03/04/19 0202  AST 161* 147* 63* 46* 34  ALT DUPLICATE REQUEST 115* 97* 80* 62*  ALKPHOS 92 88 94 99 84  BILITOT 1.9* 1.4* 1.2 1.0 1.2  PROT 8.9* 8.2* 8.1 7.3 7.2  ALBUMIN 2.5* 2.3* 2.2* 2.1* 2.0*   No results for input(s): LIPASE, AMYLASE in the last 168 hours. No results for input(s): AMMONIA in the last 168 hours.  CBC: Recent Labs  Lab 02/26/19 1349 02/07/2019 1022 03/01/19 0415 03/02/19 0132 03/03/19 0222 03/04/19 0202  WBC 8.2 10.3 12.8* 15.2* 12.4* 12.7*  NEUTROABS 6.7  --   --   --   --   --   HGB 10.3* 10.2* 10.3* 12.3* 11.8* 11.7*  HCT 33.0* 31.2* 31.5* 38.9* 36.9* 36.0*  MCV 86.2 81.9 80.6 82.8 81.3 80.7  PLT 236 274 284 346 322 298    INR: Recent Labs  Lab 03/01/19 0415 03/02/19 0132 03/03/19 0222 03/04/19 0202 03/04/19 0551  INR 3.1* 3.4* 4.0* 3.5* 3.2*    Other results:  EKG:    Imaging   DG CHEST PORT 1 VIEW  Result Date: 03/04/2019 CLINICAL DATA:  Oxygen dependent. EXAM: PORTABLE CHEST 1 VIEW COMPARISON:  February 28, 2019. FINDINGS: Stable cardiomediastinal silhouette. Stable left-sided pacemaker. Left ventricular assist device is unchanged in position. No pneumothorax is noted. Stable bilateral lung opacities are noted concerning for pneumonia or edema. Left pleural effusion cannot be excluded. Bony thorax is unremarkable. IMPRESSION: Stable bilateral lung opacities are noted concerning for pneumonia or edema. Left  ventricular assist device is unchanged in position. Electronically Signed   By: Marijo Conception M.D.   On: 03/04/2019 08:20     Medications:     Scheduled Medications: . sodium chloride   Intravenous Once  . aspirin  81 mg Oral Daily  . Chlorhexidine Gluconate Cloth  6 each Topical Daily  . furosemide  80 mg Intravenous Once  . gabapentin  600 mg Oral BID  . insulin aspart  0-9 Units Subcutaneous TID WC  . insulin aspart protamine- aspart  40 Units Subcutaneous Q breakfast  . magnesium oxide  400 mg Oral BID  . mouth rinse  15 mL Mouth Rinse BID  . methylPREDNISolone (SOLU-MEDROL) injection  60 mg Intravenous BID  . pantoprazole  40 mg Oral Daily  . [START ON 03/05/2019] predniSONE  60 mg Oral Q breakfast  . Warfarin - Pharmacist Dosing Inpatient   Does not apply q1800    Infusions:   PRN Medications: acetaminophen, ondansetron (ZOFRAN) IV   Assessment/Plan:    1. Acute hypoxemic respiratory failure: Recently seen in the ER and now admitted from clinic with marked hypoxemic requiring NRBM.  CXR with diffuse bilateral interstitial and airspace disease.  12/27 ESR elevated to 80 => 68 this admission with CRP 23.  PCT elevated mildly 0.67 => 0.7.  Respiratory virus and COVID-19 negative.  Blood cultures NGTD.  Afebrile, WBCs 15 on steroids.  Also noted to be volume overloaded on exam with significant JVD, weight gain, and peripheral edema. High resolution CT chest with diffuse GGO that could be consistent with amiodarone toxicity versus infection.  Autoimmune panel negative.  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  Amiodarone has been stopped and he has been started on IV Solumedrol.  Will follow CCM's direction regarding steroid dosing => bid Solumedrol today and to prednisone tomorrow.  - Still on high flow oxygen 10 L/min.  Wean down as able - Still volume overload on exam, held Lasix yesterday am with low MAP, now  stable.  Creatinine stable 1.8.  Will give Lasix 80 mg IV x 1 today.   - Infectious cause less likely though PCT mildly elevated.  No fever.  Have sent blood/sputum cultures and respiratory virus panel, all negative so far. Negative for COVID-19 again.   - Appreciate pulmonary assistance.   2. 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  MDT CRT-D device.  Mild volume overload on exam, improved. LVAD parameters stable on interrogation. No diuretic yesterday, creatinine stable at 1.8.  MAP improved. Suspect volume overload by exam.  - Lasix 80 mg IV x 1.  - Off antihypertensives.   - INR goal 2-2.5 with LVAD, continue ASA 81 and adjust warfarin.  - LDH higher  at 475 at admission but with acute inflammatory disease, suspect pump thrombus formation less likely.  Keep INR therapeutic (too high today). LDH stable 360 today.  3. Atrial fibrillation: Paroxysmal.  NSR today.  4. 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  5. AKI on CKD stage 3: Creatinine up to 2.34 at admit from baseline around 1.4 a couple of months ago.  However, he looked volume overloaded at admission with significant JVD.  Creatinine stable at 1.8. 6. Diabetes: Blood glucose up with steroids.  - Continue SSI - Started back on home insulin 70/30.   I reviewed the LVAD parameters from today, and compared the results to the patient's prior recorded data.  No programming changes were made.  The LVAD is functioning within specified parameters.  The patient performs LVAD self-test daily.  LVAD interrogation was negative for any significant power changes, alarms or PI events/speed drops.  LVAD equipment check completed and is in good working order.  Back-up equipment present.   LVAD education done on emergency procedures and precautions and reviewed exit site care.  Length of Stay: 4  Loralie Champagne, MD 03/04/2019, 9:50 AM  VAD Team --- VAD ISSUES ONLY--- Pager [phone] (7am - 7am)  Advanced Heart Failure Team  Pager [phone]  (M-F; 7a - 4p)  Please contact [facility] for night-coverage after hours (4p -7a ) and weekends on [email or web address]

## 2019-03-05 ENCOUNTER — Encounter (HOSPITAL_COMMUNITY): Payer: Self-pay | Admitting: Cardiology

## 2019-03-05 LAB — CBC
HCT: 37.3 % — ABNORMAL LOW (ref 39.0–52.0)
Hemoglobin: 12 g/dL — ABNORMAL LOW (ref 13.0–17.0)
MCH: 25.9 pg — ABNORMAL LOW (ref 26.0–34.0)
MCHC: 32.2 g/dL (ref 30.0–36.0)
MCV: 80.4 fL (ref 80.0–100.0)
Platelets: 311 10*3/uL (ref 150–400)
RBC: 4.64 MIL/uL (ref 4.22–5.81)
RDW: 17.7 % — ABNORMAL HIGH (ref 11.5–15.5)
WBC: 14.2 10*3/uL — ABNORMAL HIGH (ref 4.0–10.5)
nRBC: 0.6 % — ABNORMAL HIGH (ref 0.0–0.2)

## 2019-03-05 LAB — COMPREHENSIVE METABOLIC PANEL
ALT: 53 U/L — ABNORMAL HIGH (ref 0–44)
AST: 25 U/L (ref 15–41)
Albumin: 1.9 g/dL — ABNORMAL LOW (ref 3.5–5.0)
Alkaline Phosphatase: 103 U/L (ref 38–126)
Anion gap: 9 (ref 5–15)
BUN: 55 mg/dL — ABNORMAL HIGH (ref 8–23)
CO2: 27 mmol/L (ref 22–32)
Calcium: 8.5 mg/dL — ABNORMAL LOW (ref 8.9–10.3)
Chloride: 95 mmol/L — ABNORMAL LOW (ref 98–111)
Creatinine, Ser: 1.76 mg/dL — ABNORMAL HIGH (ref 0.61–1.24)
GFR calc Af Amer: 45 mL/min — ABNORMAL LOW (ref >60)
GFR calc non Af Amer: 39 mL/min — ABNORMAL LOW (ref >60)
Glucose, Bld: 263 mg/dL — ABNORMAL HIGH (ref 70–99)
Potassium: 3.6 mmol/L (ref 3.5–5.1)
Sodium: 131 mmol/L — ABNORMAL LOW (ref 135–145)
Total Bilirubin: 1.1 mg/dL (ref 0.3–1.2)
Total Protein: 7.1 g/dL (ref 6.5–8.1)

## 2019-03-05 LAB — CULTURE, BLOOD (ROUTINE X 2)
Culture: NO GROWTH
Culture: NO GROWTH

## 2019-03-05 LAB — GLUCOSE, CAPILLARY
Glucose-Capillary: 358 mg/dL — ABNORMAL HIGH (ref 70–99)
Glucose-Capillary: 288 mg/dL — ABNORMAL HIGH (ref 70–99)

## 2019-03-05 LAB — LACTATE DEHYDROGENASE: LDH: 328 U/L — ABNORMAL HIGH (ref 98–192)

## 2019-03-05 LAB — PROTIME-INR
INR: 3.1 — ABNORMAL HIGH (ref 0.8–1.2)
Prothrombin Time: 32.1 seconds — ABNORMAL HIGH (ref 11.4–15.2)

## 2019-03-05 MED ORDER — WARFARIN 0.5 MG HALF TABLET
0.5000 mg | ORAL_TABLET | Freq: Once | ORAL | Status: AC
  Administered 2019-03-05: 17:00:00 0.5 mg via ORAL
  Filled 2019-03-05: qty 1

## 2019-03-05 MED ORDER — FUROSEMIDE 10 MG/ML IJ SOLN
80.0000 mg | Freq: Once | INTRAMUSCULAR | Status: AC
  Administered 2019-03-05: 80 mg via INTRAVENOUS
  Filled 2019-03-05: qty 8

## 2019-03-05 MED ORDER — POTASSIUM CHLORIDE CRYS ER 20 MEQ PO TBCR
40.0000 meq | EXTENDED_RELEASE_TABLET | Freq: Once | ORAL | Status: AC
  Administered 2019-03-05: 10:00:00 40 meq via ORAL
  Filled 2019-03-05: qty 2

## 2019-03-05 MED ORDER — POTASSIUM CHLORIDE CRYS ER 20 MEQ PO TBCR
40.0000 meq | EXTENDED_RELEASE_TABLET | Freq: Once | ORAL | Status: AC
  Administered 2019-03-05: 40 meq via ORAL
  Filled 2019-03-05: qty 2

## 2019-03-05 NOTE — Plan of Care (Signed)
?  Problem: Education: ?Goal: Knowledge of General Education information will improve ?Description: Including pain rating scale, medication(s)/side effects and non-pharmacologic comfort measures ?Outcome: Progressing ?  ?Problem: Health Behavior/Discharge Planning: ?Goal: Ability to manage health-related needs will improve ?Outcome: Progressing ?  ?Problem: Clinical Measurements: ?Goal: Ability to maintain clinical measurements within normal limits will improve ?Outcome: Progressing ?Goal: Will remain free from infection ?Outcome: Progressing ?Goal: Diagnostic test results will improve ?Outcome: Progressing ?Goal: Respiratory complications will improve ?Outcome: Progressing ?Goal: Cardiovascular complication will be avoided ?Outcome: Progressing ?  ?Problem: Activity: ?Goal: Risk for activity intolerance will decrease ?Outcome: Progressing ?  ?Problem: Nutrition: ?Goal: Adequate nutrition will be maintained ?Outcome: Progressing ?  ?Problem: Coping: ?Goal: Level of anxiety will decrease ?Outcome: Progressing ?  ?Problem: Elimination: ?Goal: Will not experience complications related to bowel motility ?Outcome: Progressing ?Goal: Will not experience complications related to urinary retention ?Outcome: Progressing ?  ?Problem: Pain Managment: ?Goal: General experience of comfort will improve ?Outcome: Progressing ?  ?Problem: Safety: ?Goal: Ability to remain free from injury will improve ?Outcome: Progressing ?  ?Problem: Skin Integrity: ?Goal: Risk for impaired skin integrity will decrease ?Outcome: Progressing ?  ?Problem: Education: ?Goal: Patient will understand all VAD equipment and how it functions ?Outcome: Progressing ?Goal: Patient will be able to verbalize current INR target range and antiplatelet therapy for discharge home ?Outcome: Progressing ?  ?Problem: Cardiac: ?Goal: LVAD will function as expected and patient will experience no clinical alarms ?Outcome: Progressing ?  ?

## 2019-03-05 NOTE — Progress Notes (Signed)
ANTICOAGULATION CONSULT NOTE - [place] for warfarin Indication: atrial fibrillation/LVAD  Allergies  Allergen Reactions  . Penicillins Hives, Itching and Other (See Comments)    Did it involve swelling of the face/tongue/throat, SOB, or low BP? No Did it involve sudden or severe rash/hives, skin peeling, or any reaction on the inside of your mouth or nose? No Did you need to seek medical attention at a hospital or doctor's office? Unknown When did it last happen?childhood allergy If all above answers are "NO", may proceed with cephalosporin use.    Marland Kitchen Alcohol-Sulfur [Sulfur] Other (See Comments)    Burns skin    Patient Measurements: Weight: 238 lb 12.1 oz (108.3 kg)  Vital Signs: Temp: 98.3 F (36.8 C) (01/03 0650) Temp Source: Oral (01/03 0650) BP: 103/89 (01/03 0800) Pulse Rate: 64 (01/03 0800)  Labs: Recent Labs    03/03/19 0222 03/04/19 0202 03/04/19 0551 03/05/19 0216  HGB 11.8* 11.7*  --  12.0*  HCT 36.9* 36.0*  --  37.3*  PLT 322 298  --  311  LABPROT 38.7* 34.9* 32.5* 32.1*  INR 4.0* 3.5* 3.2* 3.1*  CREATININE 1.78* 1.83*  --  1.76*    Estimated Creatinine Clearance: 49.5 mL/min (A) (by C-G formula based on SCr of 1.76 mg/dL (H)).   Medical History: Past Medical History:  Diagnosis Date  . AICD (automatic cardioverter/defibrillator) present 02/05/2014   Upgrade to Medtronic biventricular ICD, serial number  [id number]   . Atrial flutter ([place]) 04/2012   s/p TEE-EPS+RFCA 04/2012  . CAD (coronary artery disease) [id number] X 2    RCA-T, 70% PL (off CFX), 99% Prox LAD/90% Dist LAD, S/P TAXUS stent x 2  . CHF (congestive heart failure) ([place])   . Chronic anticoagulation   . Chronic systolic heart failure ([place])   . CKD (chronic kidney disease)   . Diabetic retinopathy ([place])   . DM type 2 (diabetes mellitus, type 2) (HCC)    insulin dependent  . HTN (hypertension)   . Hypercholesteremia    ablation  . ICD (implantable  cardiac defibrillator) in place   . Ischemic cardiomyopathy March 2015   20-25% 2D   . Nephrolithiasis   . 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 INR above goal at 3.1 after holding 2 doses and resuming low dose warfarin yesterday, CBC and LDH stable. Expecting INR to continue trending down following held doses, will continue warfarin at a low dose today to prevent subtherapeutic levels.   *PTA Warfarin dose= 2.5mg  daily  Goal of Therapy:  INR 2.0-2.5 Monitor platelets by anticoagulation protocol: Yes   Plan:  -Warfarin 0.5 mg x 1 tonight -Daily INR  [name], PharmD, [facility] PGY2 Cardiology Pharmacy Resident Phone [phone] 03/05/2019       11:20 AM  Please check [email or web address] for unit-specific pharmacist phone numbers

## 2019-03-05 NOTE — Progress Notes (Signed)
Patient ID: [name] [name], male   DOB: August 16, 1950, 69 y.o.   MRN: [id number]   Advanced Heart Failure VAD Team Note  PCP-Cardiologist: No primary care provider on file.   Subjective:    He has been weaned to 6 L [place] this morning with oxygen saturation generally > 88%.  Lasix 80 mg IV yesterday with good UOP.  MAP 80s.   Creatinine stable at 1.76 today.   Afebrile, PCT 0.67 => 0.7.  Respiratory viral panel negative, COVID-19 negative.  Blood cultures NGTD.  ESR 68, CRP 23. Autoimmune panel negative.   LDH 471 => 352 => 347 => 347 => 360 => 328.   High resolution CT chest: New diffuse patchy pulmonary ground glass, slight mid-upper lobe predominance.  ?Amiodarone toxicity or infection.    LVAD INTERROGATION:  HeartMate 3 LVAD:   Flow 4.6 liters/min, speed 5100, power 3.5, PI 3.4.  3 PI events.    Objective:    Vital Signs:   Temp:  [97.2 F (36.2 C)-98.3 F (36.8 C)] 98.3 F (36.8 C) (01/03 0650) Pulse Rate:  [30-122] 64 (01/03 0800) Resp:  [14-24] 21 (01/03 0800) BP: (67-131)/(43-97) 103/89 (01/03 0800) SpO2:  [82 %-95 %] 93 % (01/03 0810) Last BM Date: 03/04/19 Mean arterial Pressure 80s  Intake/Output:   Intake/Output Summary (Last 24 hours) at 03/05/2019 0912 Last data filed at 03/05/2019 0800 Gross per 24 hour  Intake 1320 ml  Output 2700 ml  Net -1380 ml     Physical Exam    General: Well appearing this am. NAD.  HEENT: Normal. Neck: Supple, JVP 10 cm. Carotids OK.  Cardiac:  Mechanical heart sounds with LVAD hum present.  Lungs:  CTAB, normal effort.  Abdomen:  NT, ND, no HSM. No bruits or masses. +BS  LVAD exit site: Well-healed and incorporated. Dressing dry and intact. No erythema or drainage. Stabilization device present and accurately applied. Driveline dressing changed daily per sterile technique. Extremities:  Warm and dry. No cyanosis, clubbing, rash. 1+ ankle edema.  Neuro:  Alert & oriented x 3. Cranial nerves grossly intact. Moves all 4 extremities  w/o difficulty. Affect pleasant     Telemetry   NSR 60s with PVCs (personally reviewed)  Labs   Basic Metabolic Panel: Recent Labs  Lab 03/01/19 0415 03/02/19 0132 03/03/19 0222 03/04/19 0202 03/05/19 0216  NA 132* 135 133* 132* 131*  K 3.6 4.7 4.4 4.2 3.6  CL 95* 95* 96* 93* 95*  CO2 '23 27 27 26 27  ' GLUCOSE 235* 127* 198* 228* 263*  BUN 52* 57* 57* 57* 55*  CREATININE 1.80* 1.87* 1.78* 1.83* 1.76*  CALCIUM 8.5* 9.0 8.7* 8.4* 8.5*    Liver Function Tests: Recent Labs  Lab 02/03/2019 1022 03/02/19 0132 03/03/19 0222 03/04/19 0202 03/05/19 0216  AST 147* 63* 46* 34 25  ALT 121* 97* 80* 62* 53*  ALKPHOS 88 94 99 84 103  BILITOT 1.4* 1.2 1.0 1.2 1.1  PROT 8.2* 8.1 7.3 7.2 7.1  ALBUMIN 2.3* 2.2* 2.1* 2.0* 1.9*   No results for input(s): LIPASE, AMYLASE in the last 168 hours. No results for input(s): AMMONIA in the last 168 hours.  CBC: Recent Labs  Lab 02/26/19 1349 03/01/19 0415 03/02/19 0132 03/03/19 0222 03/04/19 0202 03/05/19 0216  WBC 8.2 12.8* 15.2* 12.4* 12.7* 14.2*  NEUTROABS 6.7  --   --   --   --   --   HGB 10.3* 10.3* 12.3* 11.8* 11.7* 12.0*  HCT 33.0* 31.5* 38.9* 36.9*  36.0* 37.3*  MCV 86.2 80.6 82.8 81.3 80.7 80.4  PLT 236 284 346 322 298 311    INR: Recent Labs  Lab 03/02/19 0132 03/03/19 0222 03/04/19 0202 03/04/19 0551 03/05/19 0216  INR 3.4* 4.0* 3.5* 3.2* 3.1*    Other results:  EKG:    Imaging   DG CHEST PORT 1 VIEW  Result Date: 03/04/2019 CLINICAL DATA:  Oxygen dependent. EXAM: PORTABLE CHEST 1 VIEW COMPARISON:  February 28, 2019. FINDINGS: Stable cardiomediastinal silhouette. Stable left-sided pacemaker. Left ventricular assist device is unchanged in position. No pneumothorax is noted. Stable bilateral lung opacities are noted concerning for pneumonia or edema. Left pleural effusion cannot be excluded. Bony thorax is unremarkable. IMPRESSION: Stable bilateral lung opacities are noted concerning for pneumonia or edema.  Left ventricular assist device is unchanged in position. Electronically Signed   By: Marijo Conception M.D.   On: 03/04/2019 08:20     Medications:     Scheduled Medications: . sodium chloride   Intravenous Once  . aspirin  81 mg Oral Daily  . Chlorhexidine Gluconate Cloth  6 each Topical Daily  . furosemide  80 mg Intravenous Once  . gabapentin  600 mg Oral BID  . insulin aspart  0-9 Units Subcutaneous TID WC  . insulin aspart protamine- aspart  40 Units Subcutaneous Q breakfast  . magnesium oxide  400 mg Oral BID  . mouth rinse  15 mL Mouth Rinse BID  . pantoprazole  40 mg Oral Daily  . potassium chloride  40 mEq Oral Once  . predniSONE  60 mg Oral Q breakfast  . Warfarin - Pharmacist Dosing Inpatient   Does not apply q1800    Infusions:   PRN Medications: acetaminophen, ondansetron (ZOFRAN) IV   Assessment/Plan:    1. Acute hypoxemic respiratory failure: Recently seen in the ER and now admitted from clinic with marked hypoxemic requiring NRBM.  CXR with diffuse bilateral interstitial and airspace disease.  12/27 ESR elevated to 80 => 68 this admission with CRP 23.  PCT elevated mildly 0.67 => 0.7.  Respiratory virus and COVID-19 negative.  Blood cultures NGTD.  Afebrile, WBCs 15 on steroids.  Also noted to be volume overloaded on exam with significant JVD, weight gain, and peripheral edema. High resolution CT chest with diffuse GGO that could be consistent with amiodarone toxicity versus infection.  Autoimmune panel negative.  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  Amiodarone has been stopped and he has been started on steroids.  Will follow CCM's direction regarding steroid dosing => transitioning to prednisone today.  - Slowly weaning oxygen, down to 6 L [place] this morning.  - Still with volume overload on exam, good diuresis yesterday with a dose of Lasix.  Creatinine stable 1.76.  Will give Lasix 80 mg IV x 1 again  today.   - Infectious cause less likely though PCT mildly elevated.  No fever.  Have sent blood/sputum cultures and respiratory virus panel, all negative so far. Negative for COVID-19 again.   - Appreciate pulmonary assistance.   2. 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  MDT CRT-D device.  LVAD parameters stable on interrogation. No diuretic yesterday, creatinine stable at 1.76.  MAP stable. Suspect he still has mild volume overload by exam.  - Lasix 80 mg IV x 1.  - Ted hose.  - Off antihypertensives.   - INR goal 2-2.5 with LVAD, continue ASA 81 and adjust  warfarin.  - LDH higher at 475 at admission but with acute inflammatory disease, suspect pump thrombus formation less likely.  Keep INR therapeutic (too high today). LDH stable 328 today.  3. Atrial fibrillation: Paroxysmal.  Difficult telemetry but probably NSR today.  4. 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  5. AKI on CKD stage 3: Creatinine up to 2.34 at admit from baseline around 1.4 a couple of months ago.  However, he looked volume overloaded at admission with significant JVD.  Creatinine stable at 1.76. 6. Diabetes: Blood glucose up with steroids.  - Continue SSI - Started back on home insulin 70/30.   He can go to [facility] today.   I reviewed the LVAD parameters from today, and compared the results to the patient's prior recorded data.  No programming changes were made.  The LVAD is functioning within specified parameters.  The patient performs LVAD self-test daily.  LVAD interrogation was negative for any significant power changes, alarms or PI events/speed drops.  LVAD equipment check completed and is in good working order.  Back-up equipment present.   LVAD education done on emergency procedures and precautions and reviewed exit site care.  Length of Stay: Whitney, MD 03/05/2019, 9:12 AM  VAD Team --- VAD ISSUES ONLY--- Pager [phone] (7am - 7am)  Advanced Heart Failure Team  Pager [phone] (M-F; 7a  - 4p)  Please contact [facility] for night-coverage after hours (4p -7a ) and weekends on [email or web address]

## 2019-03-05 NOTE — Plan of Care (Signed)
Pt is alert and oriented, on 8 L HFNC, and/or bipap during the night. Pt vebalizes understanding of nursing interventions and medications. Pt refused bipap at first but once explained the benefits, pt wore bipap throughout the night. Pt is now on 8L HFNC and tolerates it well. Pt ambulated in the hallway last night and denied shortness of breath. Pt has not had any complaints of pain. Pt is able to verbalize needs and does not voice any complaints or concerns at this time. Pt's heart rate has been consistently in the 60s and 70s; alternates between NSR and AFIB. BP has maintained with MAP of 80s. Pt is aware of surroundings and uses call bell as needed. Call bell is within reach and bed is in lowest position. Problem: Education: Goal: Knowledge of General Education information will improve Description: Including pain rating scale, medication(s)/side effects and non-pharmacologic comfort measures Outcome: Progressing   Problem: Health Behavior/Discharge Planning: Goal: Ability to manage health-related needs will improve Outcome: Progressing   Problem: Clinical Measurements: Goal: Ability to maintain clinical measurements within normal limits will improve Outcome: Progressing Goal: Cardiovascular complication will be avoided Outcome: Progressing   Problem: Activity: Goal: Risk for activity intolerance will decrease Outcome: Progressing   Problem: Coping: Goal: Level of anxiety will decrease Outcome: Progressing   Problem: Safety: Goal: Ability to remain free from injury will improve Outcome: Progressing

## 2019-03-05 NOTE — Progress Notes (Signed)
NAME:  [name] [name], MRN:  [id number], DOB:  1950/08/21, LOS: 5 ADMISSION DATE:  02/02/2019, CONSULTATION DATE:  02/18/2019 REFERRING MD:  Dr. Aundra Dubin, CHIEF COMPLAINT:  Hypoxia  Brief History   69 yoM with extensive cardiac history with LVAD presenting with acute onset hypoxia possibly concerning for HF exacerbation vs amiodarone toxicity vs unknown process.  COVID negative x 3 thus far.  Pulmonary consulted for further evaluation.    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   On chart review, patient was started on amiodarone 200 mg BID after being seen on 12/10/2017 for VT with attempted ATPx4 then requiring cardioversion to NSR.   PFTs 04/27/2017 showing "reduced lung volumes, increased FEV1/FVC ratio and diffusion defect suggest an interstitial process such as fibrosis or interstitial inflammation. Minimal airway obstruction is present. In view of the severity of the diffusion defect, studies with exercise would be helpful to evaluate the presence of hypoxemia."    05/05/2018 RHC >> VAD speed 5100 RA = 5 RV = 44/7 PA = 43/10 (22) PCW = 13 Fick cardiac output/index =4.9/2.3 PVR = 1.9 WU FA sat = 96% PA sat = 68%, 71% Well-compensated hemodynamics with VAD support. RV looks good.  Patient lives at home with his wife.  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  Former smoker, smoked 1 ppd from the age of 22 to 69.  Has lived in the house he built since the 20's.  No exposure to birds or feathered pillows.  No recent travel.  No hot tub exposures.  No prior lung issues or known family autoimmune or lung disorders.  No recent sick or known COVID exposures.  Not on home O2. Reports he is "mostly"  compliant with his dietary restrictions.  Reports being fatigued since having his LVAD but otherwise in decent health without exertional limitations.  Denies chronic cough.   Presents now with increasing 2 week history of weakness, non productive cough, and dyspnea.  Seen in UC one week ago with negative COVID.  Additionally reported some increasing LE edema and orthopnea.  Had near syncopal episode on 12/27 in which patient was evaluated in Er and evaluated by HF team.  Found to have stable rhythm AF with MAPs 100-110, hs trop 6 and BNP 439, CXR with diffuse infiltrates and saturations 99-100 on 2L [place]. He was diuresed with over ~1L UOP and discharged home on patient preference.  Differential included heart failure but given normal functioning VAD, amiodarone toxicity vs COVID included. ESR noted to be 80 therefore amiodarone was stopped and started on prednisone.   He was sent home with increased diuretics and followed back up in HF clinic.    12/29 in HF clinic, he presented weak and confused, found to be hypoxic with room air saturations in the 50's with improvement once on NRB.  He was admitted directly to [facility] ICU by HF team.  Labs worsening LFTs, AKI, BNP 439 (prior 56 08/2017), LDH 471 (trending up since 12/2018 noted to 191), INR 2.9, WBC 10.3, PCT 0.67, CXR again showing diffuse infiltrates. Pulmonary consulted for further recommendations.    Past Medical History  Chronic HFrEF 2/2 ICM s/p LVAD  HM3 placement 08/16/2017 with BiV Medtronic ICD on coumadin, [name], PAD, VT s/p ablation, DMT2, HTN, HLD, CKD III  Significant Hospital Events   12/27 ER evaluation 12/29 Admit 1/2 Improved oxygenation to 10L O2  Consults:  Pulmonary   Procedures:    Significant Diagnostic Tests:  12/29 TTE   1. Left ventricular ejection fraction, by visual estimation, is 20 to 25%. The left ventricle has severely decreased function. Left ventricular septal wall thickness was decreased. Mildly  increased left ventricular posterior wall thickness. There is  mildly increased left ventricular wall thickness.  2. Left ventricular diastolic parameters are indeterminate.  3. Global right ventricle has severely reduced systolic function.The right ventricular size is moderately enlarged.  4. Trivial mitral valve regurgitation.  5. Tricuspid valve regurgitation mild-moderate.  6. Tricuspid valve regurgitation mild-moderate.  7. Mildly elevated pulmonary artery systolic pressure.  8. A pacer wire is visualized.  9. The inferior vena cava is dilated in size with <50% respiratory variability, suggesting right atrial pressure of 15 mmHg.   ESR 68 CRP 23.5 ANA Scleroderma>> RA factor>>10.2 ANA IFA>> Anti DNA Antibody>> ANCA>> HSP>> Sjogerns A/B>> ACE>>52  12/30 HRCT Chest 1. Diffuse patchy pulmonary parenchymal ground-glass, scattered consolidation and suspected mild architectural distortion, with a slight upper/midlung zone predominance. Findings are new/progressive from 05/03/2018 and can be seen with amiodarone toxicity. Pneumonia is not excluded. 2. Trace bilateral pleural effusions. 3. Increased prominence of mediastinal lymph nodes, likely reactive. 4. Liver margin may be slightly irregular, raising suspicion for cirrhosis. 5. Gallbladder sludge and/or stones. 6.  Aortic atherosclerosis (ICD10-I70.0). 7. Enlarged pulmonic trunk, indicative of pulmonary arterial hypertension. Micro Data:  12/27 SARS2 >> neg 12/29 RVP >> Negative 12/29 SARS 2 >> Negative 12/29 MRSA PCR >> neg 12/29 BCx 2 >> NGTD  Antimicrobials:  n/a  Interim history/subjective:  Tolerated BiPAP overnight. This morning sitting in bed and reports improved dyspnea. Has been ambulating outside room and sitting in chair   Objective   Blood pressure 104/77, pulse (!) 59, temperature 98.3 F (36.8 C), temperature source Oral, resp. rate 14, weight 108.3 kg, SpO2 (!) 82 %.        Intake/Output  Summary (Last 24 hours) at 03/05/2019 0753 Last data filed at 03/05/2019 0345 Gross per 24 hour  Intake 1560 ml  Output 2550 ml  Net -990 ml   Filed Weights   03/01/19 0500 03/02/19 0600  Weight: 107.3 kg 108.3 kg   Physical Exam: General: Well-appearing, no acute distress HENT: [place], AT, OP clear, MMM Eyes: EOMI, no scleral icterus Respiratory: Bibasilar crackles Cardiovascular: LVAD hum GI: BS+, soft, nontender Extremities:1+ pitting pedal edema ,-tenderness Neuro: AAO x4, CNII-XII grossly intact Skin: Intact, no rashes or bruising Psych: Normal mood, normal affect  Resolved Hospital Problem list    Assessment & Plan:   Acute hypoxic respiratory failure  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  Less likely infectious process given no infectious predrome, normal WBC, afebrile and reassuring PCT.  Less likely PE given therapeutic INR on coumadin.  RVP and autoimmune panel are negative  Since alternative work-up was negative this is presumed amiodarone toxicity  P:  Continue supplemental O2 for sat goal > 88% and monitor for respiratory distress  Amiodarone stopped 12/27 Start PO steroids today. Will need to taper Continue diuresis per HF team Intermittent CXR  Remainder per primary  tream.  PCCM will continue to follow.   Labs and imaging for past 24 hours reviewed in EMR    The patient is critically ill with multiple organ systems failure and requires high complexity decision making for assessment and support, frequent evaluation and titration of therapies, application of advanced monitoring technologies and extensive interpretation of multiple databases.   Critical Care Time devoted to patient care services described in this note is 36 Minutes. This time reflects time of care of this signee Dr. Rodman Pickle.    Rodman Pickle, M.D. [facility] Pulmonary/Critical Care Medicine 03/05/2019 7:54 AM   Please see Amion for pager number to reach on-call Pulmonary and Critical Care Team.

## 2019-03-05 NOTE — Progress Notes (Signed)
Notified [facility] of pt's potassium 3.6 this morning and that pt received 80 mg lasix yesterday. Awaiting further orders

## 2019-03-05 NOTE — Progress Notes (Signed)
[facility] Progress Note Patient Name: SLAYTER DATA DOB: [birth year] MRN: [id number]   Date of Service  03/05/2019  HPI/Events of Note  K+ = 3.6 and Creatinine = 1.76.  eICU Interventions  Will replace K+.      Intervention Category Major Interventions: Electrolyte abnormality - evaluation and management  [place] Ndiaye Eugene 03/05/2019, 4:03 AM

## 2019-03-06 ENCOUNTER — Telehealth: Payer: Self-pay | Admitting: Pulmonary Disease

## 2019-03-06 DIAGNOSIS — J984 Other disorders of lung: Secondary | ICD-10-CM

## 2019-03-06 DIAGNOSIS — Z9981 Dependence on supplemental oxygen: Secondary | ICD-10-CM

## 2019-03-06 LAB — POCT I-STAT 7, (LYTES, BLD GAS, ICA,H+H)
Acid-Base Excess: 5 mmol/L — ABNORMAL HIGH (ref 0.0–2.0)
Acid-Base Excess: 6 mmol/L — ABNORMAL HIGH (ref 0.0–2.0)
Bicarbonate: 28.2 mmol/L — ABNORMAL HIGH (ref 20.0–28.0)
Bicarbonate: 28.8 mmol/L — ABNORMAL HIGH (ref 20.0–28.0)
Calcium, Ion: 1.2 mmol/L (ref 1.15–1.40)
Calcium, Ion: 1.19 mmol/L (ref 1.15–1.40)
HCT: 39 % (ref 39.0–52.0)
HCT: 37 % — ABNORMAL LOW (ref 39.0–52.0)
Hemoglobin: 13.3 g/dL (ref 13.0–17.0)
Hemoglobin: 12.6 g/dL — ABNORMAL LOW (ref 13.0–17.0)
O2 Saturation: 90 %
O2 Saturation: 92 %
Patient temperature: 98.3
Patient temperature: 98
Potassium: 3.5 mmol/L (ref 3.5–5.1)
Potassium: 3.7 mmol/L (ref 3.5–5.1)
Sodium: 133 mmol/L — ABNORMAL LOW (ref 135–145)
Sodium: 134 mmol/L — ABNORMAL LOW (ref 135–145)
TCO2: 29 mmol/L (ref 22–32)
TCO2: 30 mmol/L (ref 22–32)
pCO2 arterial: 35.7 mmHg (ref 32.0–48.0)
pCO2 arterial: 35.3 mmHg (ref 32.0–48.0)
pH, Arterial: 7.505 — ABNORMAL HIGH (ref 7.350–7.450)
pH, Arterial: 7.518 — ABNORMAL HIGH (ref 7.350–7.450)
pO2, Arterial: 52 mmHg — ABNORMAL LOW (ref 83.0–108.0)
pO2, Arterial: 55 mmHg — ABNORMAL LOW (ref 83.0–108.0)

## 2019-03-06 LAB — CBC
HCT: 39.1 % (ref 39.0–52.0)
Hemoglobin: 12.5 g/dL — ABNORMAL LOW (ref 13.0–17.0)
MCH: 25.8 pg — ABNORMAL LOW (ref 26.0–34.0)
MCHC: 32 g/dL (ref 30.0–36.0)
MCV: 80.6 fL (ref 80.0–100.0)
Platelets: 326 10*3/uL (ref 150–400)
RBC: 4.85 MIL/uL (ref 4.22–5.81)
RDW: 17.8 % — ABNORMAL HIGH (ref 11.5–15.5)
WBC: 16 10*3/uL — ABNORMAL HIGH (ref 4.0–10.5)
nRBC: 0.2 % (ref 0.0–0.2)

## 2019-03-06 LAB — GLUCOSE, CAPILLARY
Glucose-Capillary: 212 mg/dL — ABNORMAL HIGH (ref 70–99)
Glucose-Capillary: 208 mg/dL — ABNORMAL HIGH (ref 70–99)
Glucose-Capillary: 231 mg/dL — ABNORMAL HIGH (ref 70–99)
Glucose-Capillary: 194 mg/dL — ABNORMAL HIGH (ref 70–99)
Glucose-Capillary: 236 mg/dL — ABNORMAL HIGH (ref 70–99)
Glucose-Capillary: 165 mg/dL — ABNORMAL HIGH (ref 70–99)
Glucose-Capillary: 105 mg/dL — ABNORMAL HIGH (ref 70–99)
Glucose-Capillary: 222 mg/dL — ABNORMAL HIGH (ref 70–99)
Glucose-Capillary: 216 mg/dL — ABNORMAL HIGH (ref 70–99)
Glucose-Capillary: 289 mg/dL — ABNORMAL HIGH (ref 70–99)
Glucose-Capillary: 369 mg/dL — ABNORMAL HIGH (ref 70–99)
Glucose-Capillary: 300 mg/dL — ABNORMAL HIGH (ref 70–99)
Glucose-Capillary: 297 mg/dL — ABNORMAL HIGH (ref 70–99)
Glucose-Capillary: 350 mg/dL — ABNORMAL HIGH (ref 70–99)
Glucose-Capillary: 232 mg/dL — ABNORMAL HIGH (ref 70–99)
Glucose-Capillary: 181 mg/dL — ABNORMAL HIGH (ref 70–99)
Glucose-Capillary: 172 mg/dL — ABNORMAL HIGH (ref 70–99)
Glucose-Capillary: 189 mg/dL — ABNORMAL HIGH (ref 70–99)

## 2019-03-06 LAB — COMPREHENSIVE METABOLIC PANEL
ALT: 50 U/L — ABNORMAL HIGH (ref 0–44)
AST: 24 U/L (ref 15–41)
Albumin: 2 g/dL — ABNORMAL LOW (ref 3.5–5.0)
Alkaline Phosphatase: 120 U/L (ref 38–126)
Anion gap: 10 (ref 5–15)
BUN: 55 mg/dL — ABNORMAL HIGH (ref 8–23)
CO2: 29 mmol/L (ref 22–32)
Calcium: 8.8 mg/dL — ABNORMAL LOW (ref 8.9–10.3)
Chloride: 92 mmol/L — ABNORMAL LOW (ref 98–111)
Creatinine, Ser: 1.95 mg/dL — ABNORMAL HIGH (ref 0.61–1.24)
GFR calc Af Amer: 40 mL/min — ABNORMAL LOW (ref >60)
GFR calc non Af Amer: 34 mL/min — ABNORMAL LOW (ref >60)
Glucose, Bld: 239 mg/dL — ABNORMAL HIGH (ref 70–99)
Potassium: 4.5 mmol/L (ref 3.5–5.1)
Sodium: 131 mmol/L — ABNORMAL LOW (ref 135–145)
Total Bilirubin: 0.8 mg/dL (ref 0.3–1.2)
Total Protein: 7.6 g/dL (ref 6.5–8.1)

## 2019-03-06 LAB — MAGNESIUM: Magnesium: 2.4 mg/dL (ref 1.7–2.4)

## 2019-03-06 LAB — LACTATE DEHYDROGENASE: LDH: 365 U/L — ABNORMAL HIGH (ref 98–192)

## 2019-03-06 LAB — PROTIME-INR
INR: 3 — ABNORMAL HIGH (ref 0.8–1.2)
Prothrombin Time: 31.4 seconds — ABNORMAL HIGH (ref 11.4–15.2)

## 2019-03-06 MED ORDER — INSULIN ASPART 100 UNIT/ML ~~LOC~~ SOLN
0.0000 [IU] | Freq: Three times a day (TID) | SUBCUTANEOUS | Status: DC
  Administered 2019-03-06 (×2): 3 [IU] via SUBCUTANEOUS
  Administered 2019-03-07: 07:00:00 5 [IU] via SUBCUTANEOUS
  Administered 2019-03-07 (×2): 2 [IU] via SUBCUTANEOUS
  Administered 2019-03-08: 5 [IU] via SUBCUTANEOUS

## 2019-03-06 MED ORDER — MEXILETINE HCL 200 MG PO CAPS
200.0000 mg | ORAL_CAPSULE | Freq: Two times a day (BID) | ORAL | Status: DC
  Administered 2019-03-06 – 2019-03-11 (×11): 200 mg via ORAL
  Filled 2019-03-06 (×14): qty 1

## 2019-03-06 MED ORDER — WARFARIN 0.5 MG HALF TABLET
0.5000 mg | ORAL_TABLET | Freq: Once | ORAL | Status: AC
  Administered 2019-03-06: 0.5 mg via ORAL
  Filled 2019-03-06: qty 1

## 2019-03-06 MED ORDER — INSULIN ASPART 100 UNIT/ML ~~LOC~~ SOLN
0.0000 [IU] | Freq: Every day | SUBCUTANEOUS | Status: DC
  Administered 2019-03-07: 3 [IU] via SUBCUTANEOUS

## 2019-03-06 MED ORDER — TORSEMIDE 20 MG PO TABS
20.0000 mg | ORAL_TABLET | ORAL | Status: DC
  Administered 2019-03-06: 20 mg via ORAL
  Filled 2019-03-06: qty 1

## 2019-03-06 NOTE — Telephone Encounter (Signed)
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  Please schedule for follow-up in our interstitial lung disease clinic with Dr. Chase Caller or Dr. Vaughan Browner.  This appointment should be with in 1 to 2 weeks of patient's hospital discharge.  I suspect the patient will remain in the hospital for several more days at this time.  Garner Nash, DO [facility] 03/06/2019 9:11 AM

## 2019-03-06 NOTE — Telephone Encounter (Signed)
Pt still not discharged

## 2019-03-06 NOTE — Progress Notes (Signed)
Patient was taken off of bipap by RN and placed on nasal cannula.  Tolerating well at this time.  Will continue to monitor.

## 2019-03-06 NOTE — Progress Notes (Addendum)
Patient ID: [name] [name], male   DOB: 20-Sep-1950, 69 y.o.   MRN: [id number]   Advanced Heart Failure VAD Team Note  PCP-Cardiologist: No primary care provider on file.   Subjective:   PCT 0.67 => 0.7.  Respiratory viral panel negative, COVID-19 negative.  Blood cultures NGTD.  ESR 68, CRP 23. Autoimmune panel negative.   LDH 471 => 352 => 347 => 347 => 360 => 328=>365   High resolution CT chest: New diffuse patchy pulmonary ground glass, slight mid-upper lobe predominance.  ?Amiodarone toxicity or infection.  Amio stopped on admit 12/29.   Yesterday diuresed with 80 mg IV lasix. Negative 1.4 liters. Weight down 1 pounds.   This morning short of breath. Placed on bipap.   LVAD INTERROGATION:  HeartMate 3 LVAD:   Flow 4.1 liters/min, speed 5100, power 4, PI 4.2    12 PI events.    Objective:    Vital Signs:   Temp:  [96.1 F (35.6 C)-97.8 F (36.6 C)] 97.3 F (36.3 C) (01/04 0710) Pulse Rate:  [34-119] 80 (01/04 0829) Resp:  [12-25] 18 (01/04 0829) BP: (60-154)/(37-126) 97/73 (01/04 0829) SpO2:  [65 %-99 %] 99 % (01/04 0829) Weight:  [112.1 kg-112.9 kg] 112.1 kg (01/04 0423) Last BM Date: 03/05/19 Mean arterial Pressure 80s  Intake/Output:   Intake/Output Summary (Last 24 hours) at 03/06/2019 0840 Last data filed at 03/06/2019 0829 Gross per 24 hour  Intake 720 ml  Output 1975 ml  Net -1255 ml     Physical Exam   Physical Exam: GENERAL: No acute distress. On bipap  HEENT: normal  NECK: Supple, JVP  8-9 .  2+ bilaterally, no bruits.  No lymphadenopathy or thyromegaly appreciated.   CARDIAC:  Mechanical heart sounds with LVAD hum present.  LUNGS:  Crackles in the bases.   ABDOMEN:  Soft, round, nontender, positive bowel sounds x4.     LVAD exit site: well-healed and incorporated.  Dressing dry and intact.  No erythema or drainage.  Stabilization device present and accurately applied.  Driveline dressing is being changed daily per sterile technique. EXTREMITIES:  Warm  and dry, no cyanosis, clubbing, rash or edema  NEUROLOGIC:  Alert and oriented x 4.    No aphasia.  No dysarthria.  Affect pleasant.       Telemetry  NSR 70s with frequent PVCs.   Labs   Basic Metabolic Panel: Recent Labs  Lab 03/02/19 0132 03/03/19 0222 03/04/19 0202 03/05/19 0216 03/06/19 0232  NA 135 133* 132* 131* 131*  K 4.7 4.4 4.2 3.6 4.5  CL 95* 96* 93* 95* 92*  CO2 '27 27 26 27 29  ' GLUCOSE 127* 198* 228* 263* 239*  BUN 57* 57* 57* 55* 55*  CREATININE 1.87* 1.78* 1.83* 1.76* 1.95*  CALCIUM 9.0 8.7* 8.4* 8.5* 8.8*    Liver Function Tests: Recent Labs  Lab 03/02/19 0132 03/03/19 0222 03/04/19 0202 03/05/19 0216 03/06/19 0232  AST 63* 46* 34 25 24  ALT 97* 80* 62* 53* 50*  ALKPHOS 94 99 84 103 120  BILITOT 1.2 1.0 1.2 1.1 0.8  PROT 8.1 7.3 7.2 7.1 7.6  ALBUMIN 2.2* 2.1* 2.0* 1.9* 2.0*   No results for input(s): LIPASE, AMYLASE in the last 168 hours. No results for input(s): AMMONIA in the last 168 hours.  CBC: Recent Labs  Lab 03/02/19 0132 03/03/19 0222 03/04/19 0202 03/05/19 0216 03/06/19 0232  WBC 15.2* 12.4* 12.7* 14.2* 16.0*  HGB 12.3* 11.8* 11.7* 12.0* 12.5*  HCT 38.9* 36.9*  36.0* 37.3* 39.1  MCV 82.8 81.3 80.7 80.4 80.6  PLT 346 322 298 311 326    INR: Recent Labs  Lab 03/03/19 0222 03/04/19 0202 03/04/19 0551 03/05/19 0216 03/06/19 0232  INR 4.0* 3.5* 3.2* 3.1* 3.0*    Other results:  EKG:    Imaging   No results found.   Medications:     Scheduled Medications: . sodium chloride   Intravenous Once  . aspirin  81 mg Oral Daily  . Chlorhexidine Gluconate Cloth  6 each Topical Daily  . gabapentin  600 mg Oral BID  . insulin aspart  0-9 Units Subcutaneous TID WC  . insulin aspart protamine- aspart  40 Units Subcutaneous Q breakfast  . magnesium oxide  400 mg Oral BID  . mouth rinse  15 mL Mouth Rinse BID  . pantoprazole  40 mg Oral Daily  . predniSONE  60 mg Oral Q breakfast  . Warfarin - Pharmacist Dosing  Inpatient   Does not apply q1800    Infusions:   PRN Medications: acetaminophen, ondansetron (ZOFRAN) IV   Assessment/Plan:    1. Acute hypoxemic respiratory failure: Recently seen in the ER and now admitted from clinic with marked hypoxemic requiring NRBM.  CXR with diffuse bilateral interstitial and airspace disease.  12/27 ESR elevated to 80 => 68 this admission with CRP 23.  PCT elevated mildly 0.67 => 0.7.  Respiratory virus and COVID-19 negative.  Blood cultures NGTD.  Afebrile, WBCs 15 on steroids.  Also noted to be volume overloaded on exam with significant JVD, weight gain, and peripheral edema. High resolution CT chest with diffuse GGO that could be consistent with amiodarone toxicity versus infection.  Autoimmune panel negative.  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  Amiodarone has been stopped and he has been started on steroids.  Will follow CCM's direction regarding steroid dosing => transitioning to prednisone today.  - Remains on 6 liters.   -- Infectious cause less likely though PCT mildly elevated.  No fever.  Have sent blood/sputum cultures and respiratory virus panel, all negative so far. Negative for COVID-19 again.   - Appreciate pulmonary assistance.   - Will need follow up with pulmonary 1-2 weeks after discharge- Dr Chase Caller 2. 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  MDT CRT-D device.  LVAD parameters stable on interrogation.  - Creatinine trending up 1.7>1.9. Had lasix yesterday.  - mild volume overload. Start torsemide 20 mg M-W-F .  - Off antihypertensives.   - INR goal 2-2.5 with LVAD, continue ASA 81 and adjust warfarin.  - LDH higher at 475 at admission but with acute inflammatory disease, suspect pump thrombus formation less likely.  - INR 3 today. Discussed with pharmacy.   LDH stable 365.  3. Atrial fibrillation: Looks like A fib with controlled rate.  4. VT: Amiodarone stopped  with ?lung toxicity. Frequent PVCs. Start mexiletine 200 mg twice a day.  5. AKI on CKD stage 3: Creatinine up to 2.34 at admit from baseline around 1.4 a couple of months ago.  However, he looked volume overloaded at admission with significant JVD.  -Creatinine trending up 1.76>1.95.  - Check BMET daily.  6. Diabetes: Blood glucose up with steroids.  - Continue SSI - Continue home 70/30.   I reviewed the LVAD parameters from today, and compared the results to the patient's prior recorded data.  No programming changes were made.  The LVAD is functioning within  specified parameters.  The patient performs LVAD self-test daily.  LVAD interrogation was negative for any significant power changes, alarms or PI events/speed drops.  LVAD equipment check completed and is in good working order.  Back-up equipment present.   LVAD education done on emergency procedures and precautions and reviewed exit site care.  Length of Stay: 6  [name] Clegg, NP 03/06/2019, 8:40 AM  VAD Team --- VAD ISSUES ONLY--- Pager [phone] (7am - 7am)  Advanced Heart Failure Team  Pager [phone] (M-F; 7a - 4p)  Please contact [facility] for night-coverage after hours (4p -7a ) and weekends on [email or web address]  Patient seen and examined with the above-signed Advanced Practice Provider and/or Housestaff. I personally reviewed laboratory data, imaging studies and relevant notes. I independently examined the patient and formulated the important aspects of the plan. I have edited the note to reflect any of my changes or salient points. I have personally discussed the plan with the patient and/or family.  Back on BIPAP this am. Says he feels more SOB. Diuresed well with IV lasix. CXR slightly improved. IV steroids changed to prednisone 60 daily overnight. Sats in 90s.   VAD parameters stable   On exam  JVP 9-10 + VAD hum Lungs just mild crackles AB obese soft nt Ext 2 + edema  Monitor AF with very frequent polymorphic  PVCs.  Continue prednisone 60 for at least 2 weeks then to 40 daily after that for amio lung toxicity. Diurese as tolerated. PVCs worse off amio. Start mexilitene. INR 3.0 Discussed dosing with PharmD personally.  Glori Bickers, MD  10:00 AM

## 2019-03-06 NOTE — Progress Notes (Signed)
Patient placed back on bipap due to stating that he was having difficulty breathing.  Tolerating well at this time.  Will continue to monitor.

## 2019-03-06 NOTE — Progress Notes (Signed)
ANTICOAGULATION CONSULT NOTE - [facility] for warfarin Indication: atrial fibrillation/LVAD  Allergies  Allergen Reactions  . Penicillins Hives, Itching and Other (See Comments)    Did it involve swelling of the face/tongue/throat, SOB, or low BP? No Did it involve sudden or severe rash/hives, skin peeling, or any reaction on the inside of your mouth or nose? No Did you need to seek medical attention at a hospital or doctor's office? Unknown When did it last happen?childhood allergy If all above answers are "NO", may proceed with cephalosporin use.    Marland Kitchen Alcohol-Sulfur [Sulfur] Other (See Comments)    Burns skin    Patient Measurements: Height: 5\' 10"  (177.8 cm) Weight: 247 lb 2.2 oz (112.1 kg) IBW/kg (Calculated) : 73  Vital Signs: Temp: 97.3 F (36.3 C) (01/04 0710) Temp Source: Oral (01/04 0710) BP: 97/73 (01/04 0855) Pulse Rate: 87 (01/04 0855)  Labs: Recent Labs    03/04/19 0202 03/04/19 0551 03/05/19 0216 03/06/19 0232  HGB 11.7*  --  12.0* 12.5*  HCT 36.0*  --  37.3* 39.1  PLT 298  --  311 326  LABPROT 34.9* 32.5* 32.1* 31.4*  INR 3.5* 3.2* 3.1* 3.0*  CREATININE 1.83*  --  1.76* 1.95*    Estimated Creatinine Clearance: 45.4 mL/min (A) (by C-G formula based on SCr of 1.95 mg/dL (H)).   Medical History: Past Medical History:  Diagnosis Date  . AICD (automatic cardioverter/defibrillator) present 02/05/2014   Upgrade to Medtronic biventricular ICD, serial number  [id number]   . Atrial flutter ([place]) 04/2012   s/p TEE-EPS+RFCA 04/2012  . CAD (coronary artery disease) [id number] X 2    RCA-T, 70% PL (off CFX), 99% Prox LAD/90% Dist LAD, S/P TAXUS stent x 2  . CHF (congestive heart failure) ([place])   . Chronic anticoagulation   . Chronic systolic heart failure ([place])   . CKD (chronic kidney disease)   . Diabetic retinopathy ([place])   . DM type 2 (diabetes mellitus, type 2) (HCC)    insulin dependent  . HTN (hypertension)   .  Hypercholesteremia    ablation  . ICD (implantable cardiac defibrillator) in place   . Ischemic cardiomyopathy March 2015   20-25% 2D   . Nephrolithiasis   . 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   INR remains at 3 after holding 2 doses and restarting lower dose of warfarin for 2 nights. Hgb 12.5, plt 326. No s/sx of bleeding or infusion issues. Since drifting down slowly, will continue low dose warfarin over holding further doses.   *PTA Warfarin dose= 2.5mg  daily  Goal of Therapy:  INR 2.0-2.5 Monitor platelets by anticoagulation protocol: Yes   Plan:  -Warfarin 0.5 mg x 1 tonight -Daily INR  Antonietta Jewel, PharmD, BCCCP Clinical Pharmacist  Phone: [phone]  Please check AMION for all [facility] phone numbers After 10:00 PM, call [place] [phone] 03/06/2019       9:16 AM

## 2019-03-06 NOTE — Telephone Encounter (Signed)
Patient admitted since 12/29.  Will hold until discharge to schedule follow-up in appropriate time frame.

## 2019-03-06 NOTE — Progress Notes (Signed)
LVAD Coordinator Rounding Note:  Admitted 02/25/2019 per Dr. 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  HM III LVAD implanted on 08/16/17 by Dr. Prescott Gum  under Destination Therapy criteria.  Pt awake, lying in bed on 65% BiPAP with 94% sat. Pt says he had hard time sleeping in BiPAP mask, but is getting "more used to it now". He denies complaints.   CM shows V paced/SR with frequent PVC's; Dr. [name] in room and will start Mexiletine 200 mg q 12 hours.   Vital signs: Temp: 97.3 HR: 78 Doppler Pressure:  46 Automatic BP:  60/37 (46) Repeated: 97/73 (80) O2 Sat: 94% on 65% BiPAP Wt: 236.5>238.7>248.9>248.9>247 lbs   LVAD interrogation reveals:  Speed: 5100 Flow: 4.0 Power:  3.6w PI: 5.4 Alarms: none Events:  12 PI events Hematocrit: 20 - do not adjust  Fixed speed: 5100  Low speed limit: 4800   Drive Line:  Dressing C/D/I; anchor intact and accurately applied. Weekly dressing changes per [facility] nurse; next dressing change due 03/06/19.  Labs:  LDH trend:  475>352>347>360>328>365  INR trend: 2.9>3.1>3.4>3.2>3.1>3.0  WBC trend: 10.0>12.8>15.2>12.7>14.2>16.0  Cultures: 02/06/2019 - respiratory panel>>negative 02/11/2019 - blood cxs>negative/final   Anticoagulation Plan: -INR Goal: 2.0 - 2.5 -ASA Dose: 81 mg daily  Device:Medtronic 3 Lead Therapies: ON:  VF 200 bpm, FVT 200 - 240; VT 158 - 200 - ATP Therapies: AT/AF monitor on 171 bpm Pacing: DDD 60 Last check: 11/10/18  Plan/Recommendations:  1. Page VAD coordinator with any VAD equipment or drive line issues. 2. Weekly dressing changes per BS nurse; next change due 03/06/19.  Zada Girt RN, VAD Coordinator VAD Pager: [phone]

## 2019-03-06 NOTE — Progress Notes (Signed)
Inpatient Diabetes Program Recommendations  AACE/ADA: New Consensus Statement on Inpatient Glycemic Control (2015)  Target Ranges:  Prepandial:   less than 140 mg/dL      Peak postprandial:   less than 180 mg/dL (1-2 hours)      Critically ill patients:  140 - 180 mg/dL   Lab Results  Component Value Date   GLUCAP 181 (H) 03/06/2019   HGBA1C 9.4 (H) 02/25/2019    Review of Glycemic Control Results for [name] [name], [name] [name] (MRN [id number]) as of 03/06/2019 11:43  Ref. Range 03/05/2019 12:39 03/05/2019 15:53 03/05/2019 20:58 03/06/2019 06:32 03/06/2019 10:59  Glucose-Capillary Latest Ref Range: 70 - 99 mg/dL 350 (H) 358 (H) 288 (H) 232 (H) 181 (H)   Inpatient Diabetes Program Recommendations:    Consider changing 70/30 to 20 units bid to get effect of meal coverage in the evening as well as am.  Thank you, Nani Gasser. Avrian Delfavero, RN, MSN, CDE  Diabetes Coordinator Inpatient Glycemic Control Team Team Pager [phone] (8am-5pm) 03/06/2019 11:44 AM

## 2019-03-06 NOTE — Plan of Care (Signed)
?  Problem: Education: ?Goal: Knowledge of General Education information will improve ?Description: Including pain rating scale, medication(s)/side effects and non-pharmacologic comfort measures ?Outcome: Progressing ?  ?Problem: Health Behavior/Discharge Planning: ?Goal: Ability to manage health-related needs will improve ?Outcome: Progressing ?  ?Problem: Clinical Measurements: ?Goal: Ability to maintain clinical measurements within normal limits will improve ?Outcome: Progressing ?Goal: Will remain free from infection ?Outcome: Progressing ?Goal: Diagnostic test results will improve ?Outcome: Progressing ?Goal: Respiratory complications will improve ?Outcome: Progressing ?Goal: Cardiovascular complication will be avoided ?Outcome: Progressing ?  ?Problem: Activity: ?Goal: Risk for activity intolerance will decrease ?Outcome: Progressing ?  ?Problem: Nutrition: ?Goal: Adequate nutrition will be maintained ?Outcome: Progressing ?  ?Problem: Coping: ?Goal: Level of anxiety will decrease ?Outcome: Progressing ?  ?Problem: Elimination: ?Goal: Will not experience complications related to bowel motility ?Outcome: Progressing ?Goal: Will not experience complications related to urinary retention ?Outcome: Progressing ?  ?Problem: Pain Managment: ?Goal: General experience of comfort will improve ?Outcome: Progressing ?  ?Problem: Safety: ?Goal: Ability to remain free from injury will improve ?Outcome: Progressing ?  ?Problem: Skin Integrity: ?Goal: Risk for impaired skin integrity will decrease ?Outcome: Progressing ?  ?Problem: Education: ?Goal: Patient will understand all VAD equipment and how it functions ?Outcome: Progressing ?Goal: Patient will be able to verbalize current INR target range and antiplatelet therapy for discharge home ?Outcome: Progressing ?  ?Problem: Cardiac: ?Goal: LVAD will function as expected and patient will experience no clinical alarms ?Outcome: Progressing ?  ?

## 2019-03-06 NOTE — Progress Notes (Signed)
NAME:  [name] [name], MRN:  [id number], DOB:  December 28, 1950, LOS: 6 ADMISSION DATE:  02/09/2019, CONSULTATION DATE:  02/15/2019 REFERRING MD:  Dr. Aundra Dubin, CHIEF COMPLAINT:  Hypoxia  Brief History   69 yoM with extensive cardiac history with LVAD presenting with acute onset hypoxia possibly concerning for HF exacerbation vs amiodarone toxicity vs unknown process.  COVID negative x 3 thus far.  Pulmonary consulted for further evaluation.    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   On chart review, patient was started on amiodarone 200 mg BID after being seen on 12/10/2017 for VT with attempted ATPx4 then requiring cardioversion to NSR.   PFTs 04/27/2017 showing "reduced lung volumes, increased FEV1/FVC ratio and diffusion defect suggest an interstitial process such as fibrosis or interstitial inflammation. Minimal airway obstruction is present. In view of the severity of the diffusion defect, studies with exercise would be helpful to evaluate the presence of hypoxemia."    05/05/2018 RHC >> VAD speed 5100 RA = 5 RV = 44/7 PA = 43/10 (22) PCW = 13 Fick cardiac output/index =4.9/2.3 PVR = 1.9 WU FA sat = 96% PA sat = 68%, 71% Well-compensated hemodynamics with VAD support. RV looks good.  Patient lives at home with his wife.  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  Former smoker, smoked 1 ppd from the age of 69 to 66.  Has lived in the house he built since the 69's.  No exposure to birds or feathered pillows.  No recent travel.  No hot tub exposures.  No prior lung issues or known family autoimmune or lung disorders.  No recent sick or known COVID exposures.  Not on home O2. Reports he is "mostly"  compliant with his dietary restrictions.  Reports being fatigued since having his LVAD but otherwise in decent health without exertional limitations.  Denies chronic cough.   Presents now with increasing 2 week history of weakness, non productive cough, and dyspnea.  Seen in UC one week ago with negative COVID.  Additionally reported some increasing LE edema and orthopnea.  Had near syncopal episode on 12/27 in which patient was evaluated in Er and evaluated by HF team.  Found to have stable rhythm AF with MAPs 100-110, hs trop 6 and BNP 439, CXR with diffuse infiltrates and saturations 99-100 on 2L [place]. He was diuresed with over ~1L UOP and discharged home on patient preference.  Differential included heart failure but given normal functioning VAD, amiodarone toxicity vs COVID included. ESR noted to be 80 therefore amiodarone was stopped and started on prednisone.   He was sent home with increased diuretics and followed back up in HF clinic.    12/29 in HF clinic, he presented weak and confused, found to be hypoxic with room air saturations in the 50's with improvement once on NRB.  He was admitted directly to [facility] ICU by HF team.  Labs worsening LFTs, AKI, BNP 439 (prior 56 08/2017), LDH 471 (trending up since 12/2018 noted to 191), INR 2.9, WBC 10.3, PCT 0.67, CXR again showing diffuse infiltrates. Pulmonary consulted for further recommendations.    03/06/2019: Pulmonary follow up: Patient seen this morning in follow-up.  States that he is breathing some better.  Placed back on BiPAP.  FiO2 at 65% 12/10.  O2 sats in mid 90s.  He says that his respiratory status waxes and wanes.  Sometimes feels okay and then will have short episodes that he feels like he cannot catch his breath.  Past Medical History  Chronic HFrEF 2/2 ICM s/p LVAD HM3 placement 08/16/2017 with BiV Medtronic ICD on coumadin, [name], PAD, VT s/p ablation, DMT2, HTN, HLD, CKD III  Significant Hospital Events   12/27 ER  evaluation 12/29 Admit 1/2 Improved oxygenation to 10L O2 1/4 on BiPAP  Consults:  Pulmonary   Procedures:    Significant Diagnostic Tests:  12/29 TTE   1. Left ventricular ejection fraction, by visual estimation, is 20 to 25%. The left ventricle has severely decreased function. Left ventricular septal wall thickness was decreased. Mildly increased left ventricular posterior wall thickness. There is  mildly increased left ventricular wall thickness.  2. Left ventricular diastolic parameters are indeterminate.  3. Global right ventricle has severely reduced systolic function.The right ventricular size is moderately enlarged.  4. Trivial mitral valve regurgitation.  5. Tricuspid valve regurgitation mild-moderate.  6. Tricuspid valve regurgitation mild-moderate.  7. Mildly elevated pulmonary artery systolic pressure.  8. A pacer wire is visualized.  9. The inferior vena cava is dilated in size with <50% respiratory variability, suggesting right atrial pressure of 15 mmHg.   ESR 68 CRP 23.5 ANA Scleroderma>> negative RA factor>>10.2 ANA IFA>> negative Anti DNA Antibody>> negative ANCA>> negative HSP>> in progress Sjogerns A/B>> negative ACE>>52  12/30 HRCT Chest 1. Diffuse patchy pulmonary parenchymal ground-glass, scattered consolidation and suspected mild architectural distortion, with a slight upper/midlung zone predominance. Findings are new/progressive from 05/03/2018 and can be seen with amiodarone toxicity. Pneumonia is not excluded. 2. Trace bilateral pleural effusions. 3. Increased prominence of mediastinal lymph nodes, likely reactive. 4. Liver margin may be slightly irregular, raising suspicion for cirrhosis. 5. Gallbladder sludge and/or stones. 6.  Aortic atherosclerosis (ICD10-I70.0). 7. Enlarged pulmonic trunk, indicative of pulmonary arterial Hypertension.  Micro Data:  12/27 SARS2 >> neg 12/29 RVP >> Negative 12/29 SARS 2 >> Negative 12/29 MRSA PCR  >> neg 12/29 BCx 2 >> NGTD  Antimicrobials:  n/a  Interim history/subjective:  Once again on BiPAP.  Awake alert following commands able to have conversation.  No acute distress patient states he feels more comfortable on BiPAP then oxygen support only.  Objective   Blood pressure 97/73, pulse 80, temperature (!) 97.3 F (36.3 C), temperature source Oral, resp. rate 18, height '5\' 10"'  (1.778 m), weight 112.1 kg, SpO2 99 %.        Intake/Output Summary (Last 24 hours) at 03/06/2019 0853 Last data filed at 03/06/2019 1021 Gross per 24 hour  Intake 720 ml  Output 1975 ml  Net -1255 ml   Filed Weights   03/05/19 1200 03/05/19 1501 03/06/19 0423  Weight: 112.9 kg 112.9 kg 112.1 kg    Physical Exam: General: Obese African-American male resting comfortably in bed no distress on BiPAP HENT: NCAT, sclera clear mucous membranes moist Eyes: Tracking appropriately Respiratory: Bibasilar and lateral inspiratory crackles heard over ventilation from BiPAP Cardiovascular: Continuous hum of LVAD GI: Soft nontender mildly distended Extremities: Bilateral lower extremity edema Neuro: Awake alert following commands no focal deficit moves all 4 extremities spontaneously Skin: No obvious bruising Psych: Normal mood and affect  Resolved Hospital Problem list    Assessment & Plan:   Acute  hypoxic respiratory failure  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  The CT scan was reviewed again this morning by myself.  There is areas of significant groundglass opacities some areas of interlobular septal thickening, areas of parenchymal consolidation.  There is a slight upper and midlung zone predominance and compared to the bases suggestive of a drug-induced etiology.  Amiodarone would be at the top of this list due to exposure history.  Concomitant infection cannot be excluded however no ongoing fevers. Leukocytosis I suspect is from  steroid use and acute phase.  All other rheumatologic labs that were ordered are negative.  PCT negative.  Other options could be pulmonary hemorrhage however would suspect more groundglass predominance.  P:  Patient recently de-escalating to p.o. steroids.  Currently on 60 mg a day. Would at least continue 60 mg daily for 1 week. Then drop to 40 mg daily. Patient would be a good candidate for follow-up in our interstitial lung disease clinic at [facility] pulmonary. I will help arrange this.  Patient will likely need to remain on steroids for several weeks and will slowly taper this. Continue to titrate BiPAP to maintain SPO2 greater than 90%.  Would try to avoid hyperoxia and avoid additional free radical damage to lung parenchyma.  Remainder per primary team.  We appreciate the consultation.  Pulmonary will follow.  Labs and imaging for past 24 hours reviewed in [place], DO [place] [facility] 03/06/2019 8:53 AM

## 2019-03-07 ENCOUNTER — Inpatient Hospital Stay (HOSPITAL_COMMUNITY): Payer: PPO

## 2019-03-07 LAB — COMPREHENSIVE METABOLIC PANEL
ALT: 43 U/L (ref 0–44)
AST: 25 U/L (ref 15–41)
Albumin: 2 g/dL — ABNORMAL LOW (ref 3.5–5.0)
Alkaline Phosphatase: 105 U/L (ref 38–126)
Anion gap: 9 (ref 5–15)
BUN: 59 mg/dL — ABNORMAL HIGH (ref 8–23)
CO2: 30 mmol/L (ref 22–32)
Calcium: 8.8 mg/dL — ABNORMAL LOW (ref 8.9–10.3)
Chloride: 95 mmol/L — ABNORMAL LOW (ref 98–111)
Creatinine, Ser: 1.86 mg/dL — ABNORMAL HIGH (ref 0.61–1.24)
GFR calc Af Amer: 42 mL/min — ABNORMAL LOW (ref >60)
GFR calc non Af Amer: 36 mL/min — ABNORMAL LOW (ref >60)
Glucose, Bld: 203 mg/dL — ABNORMAL HIGH (ref 70–99)
Potassium: 5.5 mmol/L — ABNORMAL HIGH (ref 3.5–5.1)
Sodium: 134 mmol/L — ABNORMAL LOW (ref 135–145)
Total Bilirubin: 1.1 mg/dL (ref 0.3–1.2)
Total Protein: 7 g/dL (ref 6.5–8.1)

## 2019-03-07 LAB — GLUCOSE, CAPILLARY
Glucose-Capillary: 210 mg/dL — ABNORMAL HIGH (ref 70–99)
Glucose-Capillary: 138 mg/dL — ABNORMAL HIGH (ref 70–99)
Glucose-Capillary: 157 mg/dL — ABNORMAL HIGH (ref 70–99)
Glucose-Capillary: 253 mg/dL — ABNORMAL HIGH (ref 70–99)

## 2019-03-07 LAB — CBC
HCT: 38.8 % — ABNORMAL LOW (ref 39.0–52.0)
Hemoglobin: 12.3 g/dL — ABNORMAL LOW (ref 13.0–17.0)
MCH: 25.8 pg — ABNORMAL LOW (ref 26.0–34.0)
MCHC: 31.7 g/dL (ref 30.0–36.0)
MCV: 81.5 fL (ref 80.0–100.0)
Platelets: 307 10*3/uL (ref 150–400)
RBC: 4.76 MIL/uL (ref 4.22–5.81)
RDW: 18 % — ABNORMAL HIGH (ref 11.5–15.5)
WBC: 13.5 10*3/uL — ABNORMAL HIGH (ref 4.0–10.5)
nRBC: 0.2 % (ref 0.0–0.2)

## 2019-03-07 LAB — LACTATE DEHYDROGENASE: LDH: 396 U/L — ABNORMAL HIGH (ref 98–192)

## 2019-03-07 LAB — PROTIME-INR
INR: 2.6 — ABNORMAL HIGH (ref 0.8–1.2)
Prothrombin Time: 27.6 seconds — ABNORMAL HIGH (ref 11.4–15.2)

## 2019-03-07 LAB — MAGNESIUM: Magnesium: 2.4 mg/dL (ref 1.7–2.4)

## 2019-03-07 MED ORDER — WARFARIN SODIUM 1 MG PO TABS
1.0000 mg | ORAL_TABLET | Freq: Once | ORAL | Status: AC
  Administered 2019-03-07: 1 mg via ORAL
  Filled 2019-03-07: qty 1

## 2019-03-07 NOTE — Progress Notes (Addendum)
Patient ID: JAIQUAN TEMME, male   DOB: 1950/10/22, 69 y.o.   MRN: [id number]   Advanced Heart Failure VAD Team Note  PCP-Cardiologist: No primary care provider on file.   Subjective:   PCT 0.67 => 0.7.  Respiratory viral panel negative, COVID-19 negative.  Blood cultures NGTD.  ESR 68, CRP 23. Autoimmune panel negative.   LDH 396   High resolution CT chest: New diffuse patchy pulmonary ground glass, slight mid-upper lobe predominance.  ?Amiodarone toxicity or infection.  Amio stopped on admit 12/29.   Yesterday switched to home diuretic regimen torsemide 20 mg M-W-F. Weight down 3 pounds.   Feeling a little better today. Mild dyspnea with exertion. On HFNC.   LVAD INTERROGATION:  HeartMate 3 LVAD:   Flow 4.6 iters/min, speed 5100, power 4  PI 3.4       Objective:    Vital Signs:   Temp:  [97.3 F (36.3 C)-97.7 F (36.5 C)] 97.3 F (36.3 C) (01/05 0340) Pulse Rate:  [61-70] 61 (01/05 0455) Resp:  [17-25] 21 (01/05 0455) BP: (81-107)/(45-78) 107/45 (01/05 0802) SpO2:  [91 %-96 %] 92 % (01/05 0456) Weight:  [111.1 kg] 111.1 kg (01/05 0635) Last BM Date: 03/05/19 Mean arterial Pressure 70-80s   Intake/Output:   Intake/Output Summary (Last 24 hours) at 03/07/2019 0905 Last data filed at 03/07/2019 0340 Gross per 24 hour  Intake 720 ml  Output 950 ml  Net -230 ml     Physical Exam   Physical Exam: GENERAL: In bed. No acute distress. On HFNC HEENT: normal  anicteric NECK: Supple, JVP 6-7 .  2+ bilaterally, no bruits.  No lymphadenopathy or thyromegaly appreciated.   CARDIAC:  Mechanical heart sounds with LVAD hum present.  LUNGS:  Mild crackles. On 6 liters HFNC  ABDOMEN:  Soft, round, nontender, positive bowel sounds x4.     LVAD exit site: Dressing dry and intact.  No erythema or drainage.  Stabilization device present and accurately applied.  Driveline dressing is being changed daily per sterile technique. Extremities: no cyanosis, clubbing, rash, trace edema Neuro:  alert & oriented x 3, cranial nerves grossly intact. moves all 4 extremities w/o difficulty. Affect pleasant   Telemetry  NSR 60-70s with PVCs. Personally reviewed  Labs   Basic Metabolic Panel: Recent Labs  Lab 03/03/19 0222 03/04/19 0202 03/04/19 0454 03/05/19 0216 03/06/19 0232 03/07/19 0240  NA 133* 132* 134* 131* 131* 134*  K 4.4 4.2 3.7 3.6 4.5 5.5*  CL 96* 93*  --  95* 92* 95*  CO2 27 26  --  '27 29 30  ' GLUCOSE 198* 228*  --  263* 239* 203*  BUN 57* 57*  --  55* 55* 59*  CREATININE 1.78* 1.83*  --  1.76* 1.95* 1.86*  CALCIUM 8.7* 8.4*  --  8.5* 8.8* 8.8*  MG  --   --   --   --  2.4 2.4    Liver Function Tests: Recent Labs  Lab 03/03/19 0222 03/04/19 0202 03/05/19 0216 03/06/19 0232 03/07/19 0240  AST 46* 34 '25 24 25  ' ALT 80* 62* 53* 50* 43  ALKPHOS 99 84 103 120 105  BILITOT 1.0 1.2 1.1 0.8 1.1  PROT 7.3 7.2 7.1 7.6 7.0  ALBUMIN 2.1* 2.0* 1.9* 2.0* 2.0*   No results for input(s): LIPASE, AMYLASE in the last 168 hours. No results for input(s): AMMONIA in the last 168 hours.  CBC: Recent Labs  Lab 03/03/19 0222 03/04/19 0202 03/04/19 8127 03/05/19 0216 03/06/19 5170  03/07/19 0240  WBC 12.4* 12.7*  --  14.2* 16.0* 13.5*  HGB 11.8* 11.7* 12.6* 12.0* 12.5* 12.3*  HCT 36.9* 36.0* 37.0* 37.3* 39.1 38.8*  MCV 81.3 80.7  --  80.4 80.6 81.5  PLT 322 298  --  311 326 307    INR: Recent Labs  Lab 03/04/19 0202 03/04/19 0551 03/05/19 0216 03/06/19 0232 03/07/19 0240  INR 3.5* 3.2* 3.1* 3.0* 2.6*    Other results:     Imaging   DG CHEST PORT 1 VIEW  Result Date: 03/07/2019 CLINICAL DATA:  Interstitial lung disease EXAM: PORTABLE CHEST 1 VIEW COMPARISON:  Three days ago FINDINGS: Cardiomegaly with LVAD and biventricular pacer. Interstitial opacity is stable and generalized. Low lung volumes. No visible effusion or pneumothorax. IMPRESSION: Stable generalized pulmonary opacity and low lung volumes. Electronically Signed   By: Monte Fantasia M.D.    On: 03/07/2019 07:13     Medications:     Scheduled Medications: . sodium chloride   Intravenous Once  . aspirin  81 mg Oral Daily  . Chlorhexidine Gluconate Cloth  6 each Topical Daily  . gabapentin  600 mg Oral BID  . insulin aspart  0-15 Units Subcutaneous TID WC  . insulin aspart  0-5 Units Subcutaneous QHS  . insulin aspart protamine- aspart  40 Units Subcutaneous Q breakfast  . magnesium oxide  400 mg Oral BID  . mouth rinse  15 mL Mouth Rinse BID  . mexiletine  200 mg Oral Q12H  . pantoprazole  40 mg Oral Daily  . predniSONE  60 mg Oral Q breakfast  . torsemide  20 mg Oral Q M,W,F  . Warfarin - Pharmacist Dosing Inpatient   Does not apply q1800    Infusions:   PRN Medications: acetaminophen, ondansetron (ZOFRAN) IV   Assessment/Plan:    1. Acute hypoxemic respiratory failure: Recently seen in the ER and now admitted from clinic with marked hypoxemic requiring NRBM.  CXR with diffuse bilateral interstitial and airspace disease.  12/27 ESR elevated to 80 => 68 this admission with CRP 23.  PCT elevated mildly 0.67 => 0.7.  Respiratory virus and COVID-19 negative.  Blood cultures NGTD.  Afebrile, WBCs 15 on steroids.  Also noted to be volume overloaded on exam with significant JVD, weight gain, and peripheral edema. High resolution CT chest with diffuse GGO that could be consistent with amiodarone toxicity versus infection.  Autoimmune panel negative.  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  Amiodarone has been stopped and he has been started on steroids.  Will follow CCM's direction regarding steroid dosing => transitioning to prednisone today.  - Remains on 6 liters HFNC .   -- Infectious cause less likely though PCT mildly elevated.  No fever.  Have sent blood/sputum cultures and respiratory virus panel, all negative.  -Negative for COVID-19 again.   - Appreciate pulmonary assistance.   - Will need follow  up with pulmonary 1-2 weeks after discharge-ILD clinic 2. 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  MDT CRT-D device.  LVAD parameters stable on interrogation.  - Creatinine trending down 1.9>1.86.  - Volume status improved. Continue torsemide 20 mg M-W-F .  - Off antihypertensives.  Maps in the 70as  - INR goal 2-2.5 with LVAD, continue ASA 81 and adjust warfarin.  - LDH higher at 475 at admission but with acute inflammatory disease, suspect pump thrombus formation less likely.  - INR 2.6  today.  Discussed with pharmacy.   LDH stable 296 3. Atrial fibrillation: Looks like A fib with controlled rate.  4. 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 Frequent PVCs.  - On 1/4 started mexiletine 200 mg twice a day to suppress PVCs. Marland Kitchen  5. AKI on CKD stage 3: Creatinine up to 2.34 at admit from baseline around 1.4 a couple of months ago.  However, he looked volume overloaded at admission with significant JVD.  -Creatinine trending down 1.95>1.86   - Check BMET daily.  6. Diabetes: Blood glucose up with steroids.  - Continue SSI - Continue home 70/30.   Ambulate today. Try to wean oxygen.   I reviewed the LVAD parameters from today, and compared the results to the patient's prior recorded data.  No programming changes were made.  The LVAD is functioning within specified parameters.  The patient performs LVAD self-test daily.  LVAD interrogation was negative for any significant power changes, alarms or PI events/speed drops.  LVAD equipment check completed and is in good working order.  Back-up equipment present.   LVAD education done on emergency procedures and precautions and reviewed exit site care.  Length of Stay: 7  Darrick Grinder, NP 03/07/2019, 9:05 AM  VAD Team --- VAD ISSUES ONLY--- Pager [phone] (7am - 7am)  Advanced Heart Failure Team  Pager [phone] (M-F; 7a - 4p)  Please contact [facility] for night-coverage after hours (4p -7a ) and weekends on [email or web address]  Patient  seen and examined with the above-signed Advanced Practice Provider and/or Housestaff. I personally reviewed laboratory data, imaging studies and relevant notes. I independently examined the patient and formulated the important aspects of the plan. I have edited the note to reflect any of my changes or salient points. I have personally discussed the plan with the patient and/or family.  Remains tenuous. Off BIPAP this am. Now on HFNC. Continue to try and wean O2 as tolerated. Continue prednisone 60 mg daily. IV lasix switched to po torsemide.Volume status now ok. Renal function stable. K 5.5 (off potassium)   INR 2.6. Discussed dosing with PharmD personally.  VAD interrogated personally. Parameters stable.  Glori Bickers, MD  11:20 AM

## 2019-03-07 NOTE — Progress Notes (Signed)
Pt in bed, just ate lunch. Looks and feels tired/SOB. On 10L HFNC, SaO2 not registering on ear or finger. Pt sts that he would like to take a nap/rest and walk later this afternoon. Discussed with RN pt getting to recliner this afternoon and we will f/u tomorrow. Got pt nellcor probe to try to register SaO2 with portable pulse ox. When I returned with probe, pt with head covered in bed sleeping.  [place], ACSM /2:08 PM 03/07/2019

## 2019-03-07 NOTE — Progress Notes (Signed)
ANTICOAGULATION CONSULT NOTE - [name] for warfarin Indication: atrial fibrillation/LVAD  Allergies  Allergen Reactions  . Penicillins Hives, Itching and Other (See Comments)    Did it involve swelling of the face/tongue/throat, SOB, or low BP? No Did it involve sudden or severe rash/hives, skin peeling, or any reaction on the inside of your mouth or nose? No Did you need to seek medical attention at a hospital or doctor's office? Unknown When did it last happen?childhood allergy If all above answers are "NO", may proceed with cephalosporin use.    Marland Kitchen Alcohol-Sulfur [Sulfur] Other (See Comments)    Burns skin    Patient Measurements: Height: 5\' 10"  (177.8 cm) Weight: 244 lb 14.9 oz (111.1 kg) IBW/kg (Calculated) : 73  Vital Signs: Temp: 97.3 F (36.3 C) (01/05 0340) Temp Source: Axillary (01/05 0340) BP: 107/45 (01/05 0802) Pulse Rate: 61 (01/05 0455)  Labs: Recent Labs    03/05/19 0216 03/06/19 0232 03/07/19 0240  HGB 12.0* 12.5* 12.3*  HCT 37.3* 39.1 38.8*  PLT 311 326 307  LABPROT 32.1* 31.4* 27.6*  INR 3.1* 3.0* 2.6*  CREATININE 1.[name]* 1.95* 1.86*    Estimated Creatinine Clearance: 47.4 mL/min (A) (by C-G formula based on SCr of 1.86 mg/dL (H)).   Medical History: Past Medical History:  Diagnosis Date  . AICD (automatic cardioverter/defibrillator) present 02/05/2014   Upgrade to Medtronic biventricular ICD, serial number  [id number]   . Atrial flutter ([place]) 04/2012   s/p TEE-EPS+RFCA 04/2012  . CAD (coronary artery disease) [id number] X 2    RCA-T, 70% PL (off CFX), 99% Prox LAD/90% Dist LAD, S/P TAXUS stent x 2  . CHF (congestive heart failure) ([place])   . Chronic anticoagulation   . Chronic systolic heart failure ([place])   . CKD (chronic kidney disease)   . Diabetic retinopathy ([place])   . DM type 2 (diabetes mellitus, type 2) (HCC)    insulin dependent  . HTN (hypertension)   . Hypercholesteremia    ablation  . ICD  (implantable cardiac defibrillator) in place   . Ischemic cardiomyopathy March 2015   20-25% 2D   . Nephrolithiasis   . 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   INR reduced to 2.6 after holding 2 doses and restarting lower dose of warfarin for 3 nights. Hgb 12.3, plt 307. No s/sx of bleeding or infusion issues.   *PTA Warfarin dose= 2.5mg  daily  Goal of Therapy:  INR 2.0-2.5 Monitor platelets by anticoagulation protocol: Yes   Plan:  -Warfarin 1 mg x 1 tonight -Daily INR  [name] [name], PharmD, BCCCP Clinical Pharmacist  Phone: [phone]  Please check AMION for all [place] phone numbers After 10:00 PM, call [place] [phone] 03/07/2019       10:00 AM

## 2019-03-07 NOTE — Progress Notes (Addendum)
NAME:  [name] [name], MRN:  [id number], DOB:  03-14-1950, LOS: 7 ADMISSION DATE:  02/17/2019, CONSULTATION DATE:  02/13/2019 REFERRING MD:  Dr. Aundra Dubin, CHIEF COMPLAINT:  Hypoxia  Brief History   51 yoM with extensive cardiac history with LVAD presenting with acute onset hypoxia possibly concerning for HF exacerbation vs amiodarone toxicity vs unknown process.  COVID negative x 3 thus far.  Pulmonary consulted for further evaluation.    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   On chart review, patient was started on amiodarone 200 mg BID after being seen on 12/10/2017 for VT with attempted ATPx4 then requiring cardioversion to NSR.   PFTs 04/27/2017 showing "reduced lung volumes, increased FEV1/FVC ratio and diffusion defect suggest an interstitial process such as fibrosis or interstitial inflammation. Minimal airway obstruction is present. In view of the severity of the diffusion defect, studies with exercise would be helpful to evaluate the presence of hypoxemia."    05/05/2018 RHC >> VAD speed 5100 RA = 5 RV = 44/7 PA = 43/10 (22) PCW = 13 Fick cardiac output/index =4.9/2.3 PVR = 1.9 WU FA sat = 96% PA sat = 68%, 71% Well-compensated hemodynamics with VAD support. RV looks good.  Patient lives at home with his wife.  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  Former smoker, smoked 1 ppd from the age of 23 to 30.  Has lived in the house he built since the 28's.  No exposure to birds or feathered pillows.  No recent travel.  No hot tub exposures.  No prior lung issues or known family autoimmune or lung disorders.  No recent sick or known COVID exposures.  Not on home O2. Reports he is "mostly"  compliant with his dietary restrictions.  Reports being fatigued since having his LVAD but otherwise in decent health without exertional limitations.  Denies chronic cough.   Presents now with increasing 2 week history of weakness, non productive cough, and dyspnea.  Seen in UC one week ago with negative COVID.  Additionally reported some increasing LE edema and orthopnea.  Had near syncopal episode on 12/27 in which patient was evaluated in Er and evaluated by HF team.  Found to have stable rhythm AF with MAPs 100-110, hs trop 6 and BNP 439, CXR with diffuse infiltrates and saturations 99-100 on 2L [place]. He was diuresed with over ~1L UOP and discharged home on patient preference.  Differential included heart failure but given normal functioning VAD, amiodarone toxicity vs COVID included. ESR noted to be 80 therefore amiodarone was stopped and started on prednisone.   He was sent home with increased diuretics and followed back up in HF clinic.    12/29 in HF clinic, he presented weak and confused, found to be hypoxic with room air saturations in the 50's with improvement once on NRB.  He was admitted directly to [facility] ICU by HF team.  Labs worsening LFTs, AKI, BNP 439 (prior 56 08/2017), LDH 471 (trending up since 12/2018 noted to 191), INR 2.9, WBC 10.3, PCT 0.67, CXR again showing diffuse infiltrates. Pulmonary consulted for further recommendations.    03/06/2019: Pulmonary follow up: Patient seen this morning in follow-up.  States that he is breathing some better.  Placed back on BiPAP.  FiO2 at 65% 12/10.  O2 sats in mid 90s.  He says that his respiratory status waxes and wanes.  Sometimes feels okay and then will have short episodes that he feels like he cannot catch his breath.  Past Medical History  Chronic HFrEF 2/2 ICM s/p LVAD HM3 placement 08/16/2017 with BiV Medtronic ICD on coumadin, [name], PAD, VT s/p ablation, DMT2, HTN, HLD, CKD III  Significant Hospital Events   12/27 ER  evaluation 12/29 Admit 1/2 Improved oxygenation to 10L O2 1/4 on BiPAP  Consults:  Pulmonary   Procedures:    Significant Diagnostic Tests:  12/29 TTE   1. Left ventricular ejection fraction, by visual estimation, is 20 to 25%. The left ventricle has severely decreased function. Left ventricular septal wall thickness was decreased. Mildly increased left ventricular posterior wall thickness. There is  mildly increased left ventricular wall thickness.  2. Left ventricular diastolic parameters are indeterminate.  3. Global right ventricle has severely reduced systolic function.The right ventricular size is moderately enlarged.  4. Trivial mitral valve regurgitation.  5. Tricuspid valve regurgitation mild-moderate.  6. Tricuspid valve regurgitation mild-moderate.  7. Mildly elevated pulmonary artery systolic pressure.  8. A pacer wire is visualized.  9. The inferior vena cava is dilated in size with <50% respiratory variability, suggesting right atrial pressure of 15 mmHg.   ESR 68 CRP 23.5 ANA Scleroderma>> negative RA factor>>10.2 ANA IFA>> negative Anti DNA Antibody>> negative ANCA>> negative HSP>> in progress Sjogerns A/B>> negative ACE>>52  12/30 HRCT Chest 1. Diffuse patchy pulmonary parenchymal ground-glass, scattered consolidation and suspected mild architectural distortion, with a slight upper/midlung zone predominance. Findings are new/progressive from 05/03/2018 and can be seen with amiodarone toxicity. Pneumonia is not excluded. 2. Trace bilateral pleural effusions. 3. Increased prominence of mediastinal lymph nodes, likely reactive. 4. Liver margin may be slightly irregular, raising suspicion for cirrhosis. 5. Gallbladder sludge and/or stones. 6.  Aortic atherosclerosis (ICD10-I70.0). 7. Enlarged pulmonic trunk, indicative of pulmonary arterial Hypertension.  Micro Data:  12/27 SARS2 >> neg 12/29 RVP >> Negative 12/29 SARS 2 >> Negative 12/29 MRSA PCR  >> neg 12/29 BCx 2 >> NGTD  Antimicrobials:  n/a  Interim history/subjective:  No acute events. Tolerated BiPAP overnight.  Objective   Blood pressure (!) 107/45, pulse 61, temperature (!) 97.3 F (36.3 C), temperature source Axillary, resp. rate (!) 21, height 5' 10" (1.778 m), weight 111.1 kg, SpO2 92 %.        Intake/Output Summary (Last 24 hours) at 03/07/2019 0850 Last data filed at 03/07/2019 0340 Gross per 24 hour  Intake 720 ml  Output 950 ml  Net -230 ml   Filed Weights   03/05/19 1501 03/06/19 0423 03/07/19 0635  Weight: 112.9 kg 112.1 kg 111.1 kg    Physical Exam: General: Obese African-American male resting comfortably in bed no distress on HFNC watching TV HENT: NCAT, sclera clear mucous membranes moist Eyes: Tracking appropriately Respiratory: Faint crackles Cardiovascular: Continuous hum of LVAD GI: Soft nontender mildly distended Extremities: Bilateral lower extremity edema Neuro: Awake alert following commands no focal deficit moves all 4 extremities spontaneously Skin: No obvious bruising Psych: Normal mood and affect   Assessment & Plan:   Acute hypoxic respiratory failure  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  The CT scan was reviewed again this morning by myself.  There is areas  of significant groundglass opacities some areas of interlobular septal thickening, areas of parenchymal consolidation.  There is a slight upper and midlung zone predominance and compared to the bases suggestive of a drug-induced etiology.  Amiodarone would be at the top of this list due to exposure history.  Concomitant infection cannot be excluded however no ongoing fevers. Leukocytosis I suspect is from steroid use and acute phase.  All other rheumatologic labs that were ordered are negative.  PCT negative.  Other options could be pulmonary hemorrhage however would suspect more groundglass  predominance.  P:  Patient recently de-escalating to p.o. steroids.  Currently on 60 mg a day. Would at least continue 60 mg daily for 1 week then drop to 40 mg daily for several weeks with gradual taper (can be decided at outpatient follow up) Patient would be a good candidate for follow-up in our interstitial lung disease clinic at [facility] (Dr. Valeta Harms has asked the office to schedule him an appointment) Continue nocturnal and PRN BiPAP to maintain SPO2 greater than 90%.  Would try to avoid hyperoxia and avoid additional free radical damage to lung parenchyma.  Remainder per primary team.  Nothing further to add.  PCCM will sign off.  Please do not hesitate to call us back if we can be of any further assistance.   Montey Hora, [name] 03/07/2019, 8:58 AM   PCCM attending:  69 year old gentleman LVAD suspected amiodarone induced lung toxicity. Overall improving.  Chest x-ray completed this morning still with persistent infiltrates but does look slightly improved.  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 Continue 60 mg for at least 1 week, followed by 40 mg for a week and drop by 10 every 7 days.  We can have patient follow-up in our interstitial lung disease clinic.  This was discussed with cardiology this morning. Pulmonary will sign off please call with any questions.  [place]  P[facility] Critical Care 03/07/2019 3:00 PM

## 2019-03-07 NOTE — Plan of Care (Signed)

## 2019-03-07 NOTE — Progress Notes (Signed)
LVAD Coordinator Rounding Note:  Admitted 01/31/2019 per Dr. 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  HM III LVAD implanted on 08/16/17 by Dr. Prescott Gum  under Destination Therapy criteria.  Pt asleep with blanket over his head. Currently on 8 liters/high flow O2.    CM shows V paced/SR with frequent fewer PVCs than yesterday.    While checking back up controller, BS nurse reports that the battery needs to be replaced. Back up controller battery has 15 months left; no replacement required at this time.   Vital signs: Temp: 97.3 HR: 61 Doppler Pressure:  68 Automatic BP:  107/45 (65) O2 Sat: 8 l/high flow 02 Wt: 236.5>238.7>248.9>248.9>247>244.9 lbs   LVAD interrogation reveals:  Speed: 5100 Flow: 4.0 Power:  3.5w PI: 5.0 Alarms: none Events:  11 PI events on 03/06/19 Hematocrit: 20 - do not adjust  Fixed speed: 5100  Low speed limit: 4800   Drive Line:  Dressing C/D/I; anchor intact and accurately applied. Weekly dressing changes per [facility] nurse; next dressing change due 1/11//21.  Labs:  LDH trend:  475>352>347>360>328>365>396  INR trend: 2.9>3.1>3.4>3.2>3.1>3.0>2.6  WBC trend: 10.0>12.8>15.2>12.7>14.2>16.0>13.5  Cultures: 02/04/2019 - respiratory panel>>negative final 02/16/2019 - blood cxs>negative/final   Anticoagulation Plan: -INR Goal: 2.0 - 2.5 -ASA Dose: 81 mg daily  Device:Medtronic 3 Lead Therapies: ON:  VF 200 bpm, FVT 200 - 240; VT 158 - 200 - ATP Therapies: AT/AF monitor on 171 bpm Pacing: DDD 60 Last check: 11/10/18  Plan/Recommendations:  1. Page VAD coordinator with any VAD equipment or drive line issues. 2. Weekly dressing changes per BS nurse; next change due 03/06/19.  Zada Girt RN, VAD Coordinator VAD Pager: [phone]

## 2019-03-08 DIAGNOSIS — T462X5A Adverse effect of other antidysrhythmic drugs, initial encounter: Secondary | ICD-10-CM

## 2019-03-08 LAB — COMPREHENSIVE METABOLIC PANEL
ALT: 44 U/L (ref 0–44)
AST: 36 U/L (ref 15–41)
Albumin: 1.9 g/dL — ABNORMAL LOW (ref 3.5–5.0)
Alkaline Phosphatase: 104 U/L (ref 38–126)
Anion gap: 10 (ref 5–15)
BUN: 71 mg/dL — ABNORMAL HIGH (ref 8–23)
CO2: 27 mmol/L (ref 22–32)
Calcium: 8.8 mg/dL — ABNORMAL LOW (ref 8.9–10.3)
Chloride: 96 mmol/L — ABNORMAL LOW (ref 98–111)
Creatinine, Ser: 2.11 mg/dL — ABNORMAL HIGH (ref 0.61–1.24)
GFR calc Af Amer: 36 mL/min — ABNORMAL LOW (ref >60)
GFR calc non Af Amer: 31 mL/min — ABNORMAL LOW (ref >60)
Glucose, Bld: 263 mg/dL — ABNORMAL HIGH (ref 70–99)
Potassium: 5.3 mmol/L — ABNORMAL HIGH (ref 3.5–5.1)
Sodium: 133 mmol/L — ABNORMAL LOW (ref 135–145)
Total Bilirubin: 1.3 mg/dL — ABNORMAL HIGH (ref 0.3–1.2)
Total Protein: 7.2 g/dL (ref 6.5–8.1)

## 2019-03-08 LAB — GLUCOSE, CAPILLARY
Glucose-Capillary: 201 mg/dL — ABNORMAL HIGH (ref 70–99)
Glucose-Capillary: 173 mg/dL — ABNORMAL HIGH (ref 70–99)
Glucose-Capillary: 142 mg/dL — ABNORMAL HIGH (ref 70–99)
Glucose-Capillary: 108 mg/dL — ABNORMAL HIGH (ref 70–99)
Glucose-Capillary: 72 mg/dL (ref 70–99)

## 2019-03-08 LAB — BLOOD GAS, ARTERIAL
Acid-base deficit: 1.4 mmol/L (ref 0.0–2.0)
Bicarbonate: 22.8 mmol/L (ref 20.0–28.0)
Drawn by: 365271
FIO2: 65
O2 Saturation: 79.2 %
Patient temperature: 36.4
pCO2 arterial: 37.4 mmHg (ref 32.0–48.0)
pH, Arterial: 7.4 (ref 7.350–7.450)
pO2, Arterial: 47.7 mmHg — ABNORMAL LOW (ref 83.0–108.0)

## 2019-03-08 LAB — PROTIME-INR
INR: 2.8 — ABNORMAL HIGH (ref 0.8–1.2)
Prothrombin Time: 29.6 seconds — ABNORMAL HIGH (ref 11.4–15.2)

## 2019-03-08 LAB — CBC
HCT: 38.2 % — ABNORMAL LOW (ref 39.0–52.0)
Hemoglobin: 12.3 g/dL — ABNORMAL LOW (ref 13.0–17.0)
MCH: 25.7 pg — ABNORMAL LOW (ref 26.0–34.0)
MCHC: 32.2 g/dL (ref 30.0–36.0)
MCV: 79.7 fL — ABNORMAL LOW (ref 80.0–100.0)
Platelets: 283 10*3/uL (ref 150–400)
RBC: 4.79 MIL/uL (ref 4.22–5.81)
RDW: 18.2 % — ABNORMAL HIGH (ref 11.5–15.5)
WBC: 12.5 10*3/uL — ABNORMAL HIGH (ref 4.0–10.5)
nRBC: 0.2 % (ref 0.0–0.2)

## 2019-03-08 LAB — MAGNESIUM: Magnesium: 2.5 mg/dL — ABNORMAL HIGH (ref 1.7–2.4)

## 2019-03-08 LAB — LACTATE DEHYDROGENASE: LDH: 469 U/L — ABNORMAL HIGH (ref 98–192)

## 2019-03-08 MED ORDER — SODIUM CHLORIDE 0.9 % IV BOLUS
500.0000 mL | Freq: Once | INTRAVENOUS | Status: AC
  Administered 2019-03-08: 500 mL via INTRAVENOUS

## 2019-03-08 MED ORDER — SODIUM ZIRCONIUM CYCLOSILICATE 5 G PO PACK
5.0000 g | PACK | Freq: Once | ORAL | Status: AC
  Administered 2019-03-08: 5 g via ORAL
  Filled 2019-03-08: qty 1

## 2019-03-08 MED ORDER — PROSOURCE NO CARB PO LIQD
30.0000 mL | Freq: Three times a day (TID) | ORAL | Status: DC
  Filled 2019-03-08: qty 30

## 2019-03-08 MED ORDER — INSULIN ASPART 100 UNIT/ML ~~LOC~~ SOLN: 0.0000 [IU] | SUBCUTANEOUS | Status: DC

## 2019-03-08 MED ORDER — INSULIN ASPART 100 UNIT/ML ~~LOC~~ SOLN: 0.0000 [IU] | Freq: Every day | SUBCUTANEOUS | Status: DC

## 2019-03-08 MED ORDER — INSULIN ASPART 100 UNIT/ML ~~LOC~~ SOLN
0.0000 [IU] | Freq: Three times a day (TID) | SUBCUTANEOUS | Status: DC
  Administered 2019-03-08: 4 [IU] via SUBCUTANEOUS

## 2019-03-08 MED ORDER — PRO-STAT SUGAR FREE PO LIQD
30.0000 mL | Freq: Three times a day (TID) | ORAL | Status: DC
  Administered 2019-03-08 – 2019-03-12 (×8): 30 mL via ORAL
  Filled 2019-03-08 (×8): qty 30

## 2019-03-08 MED ORDER — WARFARIN 0.5 MG HALF TABLET
0.5000 mg | ORAL_TABLET | Freq: Once | ORAL | Status: AC
  Administered 2019-03-08: 20:00:00 0.5 mg via ORAL
  Filled 2019-03-08: qty 1

## 2019-03-08 NOTE — Progress Notes (Signed)
PT Cancellation Note  Patient Details Name: [name] [name] MRN: [id number] DOB: [birth year]   Cancelled Treatment:    Reason Eval/Treat Not Completed: Medical issues which prohibited therapy. Per discussion with RN pt back on BiPAP with worsening respiratory status, unlikely to be appropriate for mobility assessment today. PT will attempt to follow up when the pt is more medically stable and able to tolerate PT evaluation.   Zenaida Niece 03/08/2019, 1:59 PM

## 2019-03-08 NOTE — Telephone Encounter (Signed)
Patient is still admitted.

## 2019-03-08 NOTE — Progress Notes (Signed)
Upon arrival to unit patient was already on BIPAP. 12 IPAP 6 EPAP with FIO2 65%. RN and family member at bedside. Assessed patient and collected ABG. MD Bensimhon arrived to room. ABG sent to lab. RT called lab and asked to be called back with results.

## 2019-03-08 NOTE — Progress Notes (Signed)
RT called to bedside by RN for pt desaturation on BIPAP. RT increased FIO2 to 80% from 65% and replaced pulse oximeter. Pt sats now 91% on FIO2 of 80.Pt is on BIPAP V60 continuously. Pt has settings of 12/6 BUR 10 w/80% FIO2. Pt respiratory status is stable at this time. RT will continue to monitor.

## 2019-03-08 NOTE — Care Management Important Message (Signed)
Important Message  Patient Details  Name: SHAIDEN JORY MRN: [id number] Date of Birth: 11/05/1950   Medicare Important Message Given:  Yes     Shelda Altes 03/08/2019, 2:42 PM

## 2019-03-08 NOTE — Progress Notes (Signed)
ANTICOAGULATION CONSULT NOTE - [facility] for warfarin Indication: atrial fibrillation/LVAD  Allergies  Allergen Reactions  . Penicillins Hives, Itching and Other (See Comments)    Did it involve swelling of the face/tongue/throat, SOB, or low BP? No Did it involve sudden or severe rash/hives, skin peeling, or any reaction on the inside of your mouth or nose? No Did you need to seek medical attention at a hospital or doctor's office? Unknown When did it last happen?childhood allergy If all above answers are "NO", may proceed with cephalosporin use.    Marland Kitchen Alcohol-Sulfur [Sulfur] Other (See Comments)    Burns skin    Patient Measurements: Height: 5\' 10"  (177.8 cm) Weight: 251 lb 1.7 oz (113.9 kg) IBW/kg (Calculated) : 73  Vital Signs: Temp: 97.8 F (36.6 C) (01/06 0500) Temp Source: Oral (01/06 0500) BP: 109/95 (01/06 0802) Pulse Rate: 32 (01/06 0802)  Labs: Recent Labs    03/06/19 0232 03/07/19 0240 03/08/19 0304  HGB 12.5* 12.3* 12.3*  HCT 39.1 38.8* 38.2*  PLT 326 307 283  LABPROT 31.4* 27.6* 29.6*  INR 3.0* 2.6* 2.8*  CREATININE 1.95* 1.86* 2.11*    Estimated Creatinine Clearance: 42.4 mL/min (A) (by C-G formula based on SCr of 2.11 mg/dL (H)).   Medical History: Past Medical History:  Diagnosis Date  . AICD (automatic cardioverter/defibrillator) present 02/05/2014   Upgrade to [employer]onic biventricular ICD, serial number  [id number]   . Atrial flutter ([facility]) 04/2012   s/p TEE-EPS+RFCA 04/2012  . CAD (coronary artery disease) [id number] X 2    RCA-T, 70% PL (off CFX), 99% Prox LAD/90% Dist LAD, S/P TAXUS stent x 2  . CHF (congestive heart failure) ([facility])   . Chronic anticoagulation   . Chronic systolic heart failure ([facility])   . CKD (chronic kidney disease)   . Diabetic retinopathy ([facility])   . DM type 2 (diabetes mellitus, type 2) ([facility])    insulin dependent  . HTN (hypertension)   . Hypercholesteremia    ablation  . ICD  (implantable cardiac defibrillator) in place   . Ischemic cardiomyopathy March 2015   20-25% 2D   . Nephrolithiasis   . 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   INR up to 2.8 from 2.6 -receivedwarfarin 1 mg yesterday. Hgb 12.3, plt 283. No s/sx of bleeding or infusion issues. Oral intake documented at 80-100%.  *PTA Warfarin dose= 2.5mg  daily  Goal of Therapy:  INR 2.0-2.5 Monitor platelets by anticoagulation protocol: Yes   Plan:  -Warfarin 0.5 mg x 1 tonight -Daily INR  Antonietta Jewel, PharmD, BCCCP Clinical Pharmacist  Phone: [phone]  Please check AMION for all [place] [place] phone numbers After 10:00 PM, call [facility] [phone] 03/08/2019       8:39 AM

## 2019-03-08 NOTE — Progress Notes (Signed)
Patient states having difficulty breathing...moaning and lethargic. Pt able to answer name, DOB, place, and situation. Rapid response and LVAD team called to beside. Blood sugar 147.Marland Kitchen doppler 86.Marland Kitchen SpO2 monitor did not show good pleth. ABG done. Patient placed on BiPap. Hand wrapped in warm blanket and hot pad in hand. SpO2 registering 88-96%. Patient becoming more alert. Wife at beside. RN will continue to monitor.

## 2019-03-08 NOTE — Progress Notes (Addendum)
Patient ID: [name] [name], male   DOB: [birth year], 69 y.o.   MRN: [id number]   Advanced Heart Failure VAD Team Note  PCP-Cardiologist: No primary care provider on file.   Subjective:   PCT 0.67 => 0.7.  Respiratory viral panel negative, COVID-19 negative.  Blood cultures NGTD.  ESR 68, CRP 23. Autoimmune panel negative.   LDH 396   High resolution CT chest: New diffuse patchy pulmonary ground glass, slight mid-upper lobe predominance.  ?Amiodarone toxicity or infection.  Amio stopped on admit 12/29.   Remains on 10 liters HFNC.   Complaining of fatigue. Says he is weak when he stands up.   LVAD INTERROGATION:  HeartMate 3 LVAD:   Flow 4 iters/min, speed 5100, power 3  PI 5.3   Objective:    Vital Signs:   Temp:  [97.7 F (36.5 C)-98.1 F (36.7 C)] 97.8 F (36.6 C) (01/06 0500) Pulse Rate:  [32-89] 32 (01/06 0802) Resp:  [18-29] 22 (01/06 0802) BP: (80-117)/(54-95) 109/95 (01/06 0802) SpO2:  [66 %-93 %] 70 % (01/06 0802) Weight:  [113.9 kg] 113.9 kg (01/06 0645) Last BM Date: 03/07/19(per patient; RN did not witness) Mean arterial Pressure 70-80s   Intake/Output:   Intake/Output Summary (Last 24 hours) at 03/08/2019 0815 Last data filed at 03/08/2019 0802 Gross per 24 hour  Intake 960 ml  Output 800 ml  Net 160 ml     Physical Exam    Physical Exam: GENERAL: No acute distress. HEENT: normal anicteric  NECK: Supple, JVP 6-7   .  2+ bilaterally, no bruits.  No lymphadenopathy or thyromegaly appreciated.   CARDIAC:  Mechanical heart sounds with LVAD hum present.  LUNGS:  Crackles very mild throughout. On 10 liters HFNC.  ABDOMEN:  Obese Soft, round, nontender, positive bowel sounds x4.     LVAD exit site:  Dressing dry and intact.  No erythema or drainage.  Stabilization device present and accurately applied.  Driveline dressing is being changed daily per sterile technique. EXTREMITIES:  Warm and dry, no cyanosis, clubbing, rash or edema  NEUROLOGIC:  Alert and  oriented x 4. No aphasia.  No dysarthria.  Affect pleasant.   Mild tremor.  Seems confused at times   Telemetry   NSR 60-70s with PVCs Personally reviewed   Labs   Basic Metabolic Panel: Recent Labs  Lab 03/04/19 0202 03/04/19 0454 03/05/19 0216 03/06/19 0232 03/07/19 0240 03/08/19 0304  NA 132* 134* 131* 131* 134* 133*  K 4.2 3.7 3.6 4.5 5.5* 5.3*  CL 93*  --  95* 92* 95* 96*  CO2 26  --  '27 29 30 27  ' GLUCOSE 228*  --  263* 239* 203* 263*  BUN 57*  --  55* 55* 59* 71*  CREATININE 1.83*  --  1.76* 1.95* 1.86* 2.11*  CALCIUM 8.4*  --  8.5* 8.8* 8.8* 8.8*  MG  --   --   --  2.4 2.4 2.5*    Liver Function Tests: Recent Labs  Lab 03/04/19 0202 03/05/19 0216 03/06/19 0232 03/07/19 0240 03/08/19 0304  AST 34 '25 24 25 ' 36  ALT 62* 53* 50* 43 44  ALKPHOS 84 103 120 105 104  BILITOT 1.2 1.1 0.8 1.1 1.3*  PROT 7.2 7.1 7.6 7.0 7.2  ALBUMIN 2.0* 1.9* 2.0* 2.0* 1.9*   No results for input(s): LIPASE, AMYLASE in the last 168 hours. No results for input(s): AMMONIA in the last 168 hours.  CBC: Recent Labs  Lab 03/04/19 0202 03/04/19 0454  03/05/19 0216 03/06/19 0232 03/07/19 0240 03/08/19 0304  WBC 12.7*  --  14.2* 16.0* 13.5* 12.5*  HGB 11.7* 12.6* 12.0* 12.5* 12.3* 12.3*  HCT 36.0* 37.0* 37.3* 39.1 38.8* 38.2*  MCV 80.7  --  80.4 80.6 81.5 79.7*  PLT 298  --  311 326 307 283    INR: Recent Labs  Lab 03/04/19 0551 03/05/19 0216 03/06/19 0232 03/07/19 0240 03/08/19 0304  INR 3.2* 3.1* 3.0* 2.6* 2.8*    Other results:     Imaging   DG CHEST PORT 1 VIEW  Result Date: 03/07/2019 CLINICAL DATA:  Interstitial lung disease EXAM: PORTABLE CHEST 1 VIEW COMPARISON:  Three days ago FINDINGS: Cardiomegaly with LVAD and biventricular pacer. Interstitial opacity is stable and generalized. Low lung volumes. No visible effusion or pneumothorax. IMPRESSION: Stable generalized pulmonary opacity and low lung volumes. Electronically Signed   By: Monte Fantasia M.D.    On: 03/07/2019 07:13     Medications:     Scheduled Medications: . sodium chloride   Intravenous Once  . aspirin  81 mg Oral Daily  . Chlorhexidine Gluconate Cloth  6 each Topical Daily  . gabapentin  600 mg Oral BID  . insulin aspart  0-15 Units Subcutaneous TID WC  . insulin aspart  0-5 Units Subcutaneous QHS  . insulin aspart protamine- aspart  40 Units Subcutaneous Q breakfast  . magnesium oxide  400 mg Oral BID  . mouth rinse  15 mL Mouth Rinse BID  . mexiletine  200 mg Oral Q12H  . pantoprazole  40 mg Oral Daily  . predniSONE  60 mg Oral Q breakfast  . torsemide  20 mg Oral Q M,W,F  . Warfarin - Pharmacist Dosing Inpatient   Does not apply q1800    Infusions:   PRN Medications: acetaminophen, ondansetron (ZOFRAN) IV   Assessment/Plan:    1. Acute hypoxemic respiratory failure: Recently seen in the ER and now admitted from clinic with marked hypoxemic requiring NRBM.  CXR with diffuse bilateral interstitial and airspace disease.  12/27 ESR elevated to 80 => 68 this admission with CRP 23.  PCT elevated mildly 0.67 => 0.7.  Respiratory virus and COVID-19 negative.  Blood cultures NGTD.  Afebrile, WBCs 15 on steroids.  Also noted to be volume overloaded on exam with significant JVD, weight gain, and peripheral edema. High resolution CT chest with diffuse GGO that could be consistent with amiodarone toxicity versus infection.  Autoimmune panel negative.  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  Amiodarone has been stopped and he has been started on steroids.  Will follow CCM's direction regarding steroid dosing => transitioning to prednisone today.  - Remains on 10 liters HFNC .  Wean and ambulate today.  -- Infectious cause less likely though PCT mildly elevated.  No fever.  Have sent blood/sputum cultures and respiratory virus panel, all negative.  -Negative for COVID-19 again.   - Appreciate pulmonary assistance.    - Will need follow up with pulmonary 1-2 weeks after discharge-ILD clinic 2. 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  MDT CRT-D device.  LVAD parameters stable on interrogation.  - Creatinine trending up 1.86>2.1   - Volume status improved. Continue torsemide 20 mg M-W-F .  - Off antihypertensives.   - INR goal 2-2.5 with LVAD, continue ASA 81 and adjust warfarin.  - LDH higher at 475 at admission but with acute inflammatory disease, suspect pump thrombus formation less  likely.  - INR 2.8 today. Discussed with pharmacy.   LDH trending up 396>469. LDH in am.  3. Atrial fibrillation: Looks like A fib with controlled rate.  4. 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 Frequent PVCs.  - On 1/4 started mexiletine 200 mg twice a day to suppress PVCs.  5. AKI on CKD stage 3: Creatinine up to 2.34 at admit from baseline around 1.4 a couple of months ago.  However, he looked volume overloaded at admission with significant JVD.  -Creatinine 1.8>2.1 - Check BMET daily.  6. Diabetes: Blood glucose up with steroids.  - Continue SSI - Continue home 70/30.  7. Severe Malnutrition Albumin 1.9. Consult dietitian  Consult PT. Discussed with CT [facility], Pharmacy and nursing staff.   I reviewed the LVAD parameters from today, and compared the results to the patient's prior recorded data.  No programming changes were made.  The LVAD is functioning within specified parameters.  The patient performs LVAD self-test daily.  LVAD interrogation was negative for any significant power changes, alarms or PI events/speed drops.  LVAD equipment check completed and is in good working order.  Back-up equipment present.   LVAD education done on emergency procedures and precautions and reviewed exit site care.  Length of Stay: [place], NP 03/08/2019, 8:15 AM  VAD Team --- VAD ISSUES ONLY--- Pager [phone] (7am - 7am)  Advanced Heart Failure Team  Pager [phone] (M-F; 7a - 4p)  Please contact [facility] for night-coverage after hours (4p -7a ) and weekends on [email or web address]  Patient seen and examined with the above-signed Advanced Practice Provider and/or Housestaff. I personally reviewed laboratory data, imaging studies and relevant notes. I independently examined the patient and formulated the important aspects of the plan. I have edited the note to reflect any of my changes or salient points. I have personally discussed the plan with the patient and/or family.  Sats are improving markedly on high-dose steroids. Respiratory exam much improved. Hard to track sats with pulse-ox but we eventually got a nice pleth with sats in high 90s on 10L. I turned him down to 5L and still maintained sats.   He does seem somewhat confused today and may be related to steroids. Will continue at 60/day for now. Decrease to 40 daily soon. He is quite debilitated. Need to mobilize with PT. Consult CIR.   K is up will give lokelma. Creatinine up. Hold torsemide.   LDH climbing but INR OK and no power spikes on LVAD interrogation.   Seen at bedside with VAD coordinator and Dr. Prescott Gum.

## 2019-03-08 NOTE — Plan of Care (Signed)
  Problem: Clinical Measurements: Goal: Will remain free from infection Outcome: Progressing Goal: Diagnostic test results will improve Outcome: Progressing   Problem: Coping: Goal: Level of anxiety will decrease Outcome: Progressing   

## 2019-03-08 NOTE — Progress Notes (Signed)
Inpatient Rehabilitation Admissions Coordinator  Inpatient rehab consult received. I await therapy evals to proceed with rehab assessment.  Danne Baxter, RN, MSN Rehab Admissions Coordinator [phone] 03/08/2019 9:59 AM

## 2019-03-08 NOTE — Progress Notes (Signed)
   Called by nursing staff for increased dyspnea.   Placed on Bipap. ABG obtained. Reviewed by Dr Vaughan Browner.   Continue Bipap and transition back to 10 L HFNC.    No other change for now.   Yaretsi Humphres NP-C  2:13 PM

## 2019-03-09 ENCOUNTER — Telehealth: Payer: Self-pay | Admitting: *Deleted

## 2019-03-09 ENCOUNTER — Inpatient Hospital Stay (HOSPITAL_COMMUNITY): Payer: PPO

## 2019-03-09 ENCOUNTER — Other Ambulatory Visit: Payer: Self-pay

## 2019-03-09 ENCOUNTER — Telehealth (HOSPITAL_COMMUNITY): Payer: Self-pay | Admitting: *Deleted

## 2019-03-09 DIAGNOSIS — R0602 Shortness of breath: Secondary | ICD-10-CM

## 2019-03-09 LAB — CBC
HCT: 36.6 % — ABNORMAL LOW (ref 39.0–52.0)
Hemoglobin: 11.8 g/dL — ABNORMAL LOW (ref 13.0–17.0)
MCH: 26 pg (ref 26.0–34.0)
MCHC: 32.2 g/dL (ref 30.0–36.0)
MCV: 80.8 fL (ref 80.0–100.0)
Platelets: 283 10*3/uL (ref 150–400)
RBC: 4.53 MIL/uL (ref 4.22–5.81)
RDW: 18.1 % — ABNORMAL HIGH (ref 11.5–15.5)
WBC: 15.8 10*3/uL — ABNORMAL HIGH (ref 4.0–10.5)
nRBC: 0.1 % (ref 0.0–0.2)

## 2019-03-09 LAB — LACTATE DEHYDROGENASE: LDH: 485 U/L — ABNORMAL HIGH (ref 98–192)

## 2019-03-09 LAB — POCT I-STAT 7, (LYTES, BLD GAS, ICA,H+H)
Acid-Base Excess: 7 mmol/L — ABNORMAL HIGH (ref 0.0–2.0)
Acid-Base Excess: 4 mmol/L — ABNORMAL HIGH (ref 0.0–2.0)
Bicarbonate: 30.7 mmol/L — ABNORMAL HIGH (ref 20.0–28.0)
Bicarbonate: 28 mmol/L (ref 20.0–28.0)
Calcium, Ion: 1.18 mmol/L (ref 1.15–1.40)
Calcium, Ion: 1.18 mmol/L (ref 1.15–1.40)
HCT: 42 % (ref 39.0–52.0)
HCT: 41 % (ref 39.0–52.0)
Hemoglobin: 14.3 g/dL (ref 13.0–17.0)
Hemoglobin: 13.9 g/dL (ref 13.0–17.0)
O2 Saturation: 83 %
O2 Saturation: 92 %
Patient temperature: 97.4
Patient temperature: 97.1
Potassium: 4.8 mmol/L (ref 3.5–5.1)
Potassium: 5 mmol/L (ref 3.5–5.1)
Sodium: 137 mmol/L (ref 135–145)
Sodium: 138 mmol/L (ref 135–145)
TCO2: 32 mmol/L (ref 22–32)
TCO2: 29 mmol/L (ref 22–32)
pCO2 arterial: 37.6 mmHg (ref 32.0–48.0)
pCO2 arterial: 37.1 mmHg (ref 32.0–48.0)
pH, Arterial: 7.517 — ABNORMAL HIGH (ref 7.350–7.450)
pH, Arterial: 7.482 — ABNORMAL HIGH (ref 7.350–7.450)
pO2, Arterial: 41 mmHg — ABNORMAL LOW (ref 83.0–108.0)
pO2, Arterial: 56 mmHg — ABNORMAL LOW (ref 83.0–108.0)

## 2019-03-09 LAB — HYPERSENSITIVITY PNEUMONITIS
A. Pullulans Abs: NEGATIVE
A.Fumigatus #1 Abs: NEGATIVE
Micropolyspora faeni, IgG: NEGATIVE
Pigeon Serum Abs: NEGATIVE
Thermoact. Saccharii: NEGATIVE
Thermoactinomyces vulgaris, IgG: NEGATIVE

## 2019-03-09 LAB — GLUCOSE, CAPILLARY
Glucose-Capillary: 90 mg/dL (ref 70–99)
Glucose-Capillary: 52 mg/dL — ABNORMAL LOW (ref 70–99)
Glucose-Capillary: 170 mg/dL — ABNORMAL HIGH (ref 70–99)
Glucose-Capillary: 171 mg/dL — ABNORMAL HIGH (ref 70–99)
Glucose-Capillary: 142 mg/dL — ABNORMAL HIGH (ref 70–99)
Glucose-Capillary: 151 mg/dL — ABNORMAL HIGH (ref 70–99)

## 2019-03-09 LAB — COMPREHENSIVE METABOLIC PANEL
ALT: 73 U/L — ABNORMAL HIGH (ref 0–44)
AST: 87 U/L — ABNORMAL HIGH (ref 15–41)
Albumin: 2 g/dL — ABNORMAL LOW (ref 3.5–5.0)
Alkaline Phosphatase: 94 U/L (ref 38–126)
Anion gap: 9 (ref 5–15)
BUN: 72 mg/dL — ABNORMAL HIGH (ref 8–23)
CO2: 28 mmol/L (ref 22–32)
Calcium: 8.5 mg/dL — ABNORMAL LOW (ref 8.9–10.3)
Chloride: 97 mmol/L — ABNORMAL LOW (ref 98–111)
Creatinine, Ser: 1.83 mg/dL — ABNORMAL HIGH (ref 0.61–1.24)
GFR calc Af Amer: 43 mL/min — ABNORMAL LOW (ref >60)
GFR calc non Af Amer: 37 mL/min — ABNORMAL LOW (ref >60)
Glucose, Bld: 103 mg/dL — ABNORMAL HIGH (ref 70–99)
Potassium: 4.8 mmol/L (ref 3.5–5.1)
Sodium: 134 mmol/L — ABNORMAL LOW (ref 135–145)
Total Bilirubin: 1.7 mg/dL — ABNORMAL HIGH (ref 0.3–1.2)
Total Protein: 7.4 g/dL (ref 6.5–8.1)

## 2019-03-09 LAB — MAGNESIUM: Magnesium: 2.4 mg/dL (ref 1.7–2.4)

## 2019-03-09 LAB — PROTIME-INR
INR: 3.1 — ABNORMAL HIGH (ref 0.8–1.2)
Prothrombin Time: 32 seconds — ABNORMAL HIGH (ref 11.4–15.2)

## 2019-03-09 MED ORDER — FUROSEMIDE 10 MG/ML IJ SOLN
40.0000 mg | Freq: Once | INTRAMUSCULAR | Status: AC
  Administered 2019-03-09: 40 mg via INTRAVENOUS
  Filled 2019-03-09: qty 4

## 2019-03-09 MED ORDER — INSULIN ASPART 100 UNIT/ML ~~LOC~~ SOLN: 0.0000 [IU] | Freq: Three times a day (TID) | SUBCUTANEOUS | Status: DC

## 2019-03-09 MED ORDER — INSULIN ASPART PROT & ASPART (70-30 MIX) 100 UNIT/ML ~~LOC~~ SUSP
15.0000 [IU] | Freq: Two times a day (BID) | SUBCUTANEOUS | Status: DC
  Filled 2019-03-09: qty 10

## 2019-03-09 MED ORDER — DEXTROSE 50 % IV SOLN
INTRAVENOUS | Status: AC
  Filled 2019-03-09: qty 50

## 2019-03-09 MED ORDER — INSULIN ASPART PROT & ASPART (70-30 MIX) 100 UNIT/ML ~~LOC~~ SUSP
40.0000 [IU] | Freq: Every day | SUBCUTANEOUS | Status: DC
  Filled 2019-03-09: qty 10

## 2019-03-09 MED ORDER — VASOPRESSIN 20 UNIT/ML IV SOLN
0.0400 [IU]/min | INTRAVENOUS | Status: DC
  Administered 2019-03-09 – 2019-03-15 (×7): 0.03 [IU]/min via INTRAVENOUS
  Filled 2019-03-09 (×12): qty 2

## 2019-03-09 MED ORDER — METHYLPREDNISOLONE SODIUM SUCC 125 MG IJ SOLR
60.0000 mg | Freq: Two times a day (BID) | INTRAMUSCULAR | Status: DC
  Administered 2019-03-09 – 2019-03-11 (×4): 60 mg via INTRAVENOUS
  Filled 2019-03-09 (×4): qty 2

## 2019-03-09 NOTE — Progress Notes (Signed)
PT Cancellation Note  Patient Details Name: LYNKON PELLER MRN: [id number] DOB: Feb 22, 1951   Cancelled Treatment:    Reason Eval/Treat Not Completed: Medical issues which prohibited therapy. Pt with code called this morning due to hypoxia, requiring BiPAP and transfer to ICU. PT will follow up when pt is more medically appropriate and able to participate in skilled mobility assessment.   Zenaida Niece 03/09/2019, 9:59 AM

## 2019-03-09 NOTE — CV Procedure (Signed)
Radial arterial line placement.    Procedure explained. Consent signed. The left wrist was prepped and draped in the routine sterile fashion a single lumen radial arterial catheter was placed in the left radial artery using a modified Seldinger technique and u/s guidance. Good blood flow and wave forms. A dressing was placed.     Glori Bickers, MD  2:32 PM

## 2019-03-09 NOTE — Progress Notes (Signed)
RT note-Called to room on 2C, patient with low spo2 on HFNC, not responsive, upon entering room nursing placing on Bipap, RT to sesure mask and connections, patient aroused, suger checked, stablized then transferred to [facility] on Bipap.

## 2019-03-09 NOTE — Progress Notes (Signed)
fio2 increased to 90% s/p ABG results.  RN aware. Sat monitor intermittently picking up.

## 2019-03-09 NOTE — Progress Notes (Signed)
  Remains very hypoxic. Now requiring 90% FiO2 on Bipap with persistent large Aa gradient.  Sats in low 90s.  When Bipap removed patient rolled eyes back in his head and almost went out.   Vasopressin now started for BP support.  Suspect he is nearing the need for intubation.   Will give one dose IV lasix and switch po steroids.   Both he and his wife are aware of how tenuous his situation is.   Additional CCT 40 mins  Glori Bickers, MD  8:14 PM

## 2019-03-09 NOTE — Progress Notes (Signed)
Patient ID: [name] [name], male   DOB: 03-Feb-1951, 69 y.o.   MRN: [id number]   Advanced Heart Failure VAD Team Note  PCP-Cardiologist: No primary care provider on file.   Subjective:    Remains on oral prednisone for amio-induced lung toxicity.    Received 500cc IVF last night for decreasing flows of VAD  This am Code Blue called and I responded. Patient very lethargic. Neraly unresponsive. Sats not tracking well. Seemed to be in 51s. I bagged him personally and RT called to but him on BIPAP. Recovered slowly. CBG 54 given 1/2 amp D50  LVAD INTERROGATION:  HeartMate 3 LVAD:   Flow 3.9 iters/min, speed 5100, power 3.6  PI 5.6 Objective:    Vital Signs:   Temp:  [96.2 F (35.7 C)-97.8 F (36.6 C)] 97.8 F (36.6 C) (01/07 0800) Pulse Rate:  [27-96] 94 (01/07 0800) Resp:  [15-30] 20 (01/07 0800) BP: (75-184)/(63-142) 87/63 (01/07 0800) SpO2:  [68 %-97 %] 97 % (01/07 0753) FiO2 (%):  [65 %-80 %] 80 % (01/06 2308) Weight:  [116.6 kg] 116.6 kg (01/07 0532) Last BM Date: 03/07/19(per patient; RN did not witness) Mean arterial Pressure 80-90s   Intake/Output:   Intake/Output Summary (Last 24 hours) at 03/09/2019 0907 Last data filed at 03/08/2019 1925 Gross per 24 hour  Intake --  Output 100 ml  Net -100 ml     Physical Exam    Physical Exam: General:  Lethargic increased WOB HEENT: normal  Neck: supple. JVP9-10.  Carotids 2+ bilat; no bruits. No lymphadenopathy or thryomegaly appreciated. Cor: LVAD hum.  Lungs: Mild diffuse crackles Abdomen: obese soft, nontender, non-distended. No hepatosplenomegaly. No bruits or masses. Good bowel sounds. Driveline site clean. Anchor in place.  Extremities: no cyanosis, clubbing, rash. Warm 2+ edema  Neuro: lethargic but arousable. Nonfocal  Telemetry   AF 90-120s with PVCs Personally reviewed   Labs   Basic Metabolic Panel: Recent Labs  Lab 03/05/19 0216 03/06/19 0232 03/07/19 0240 03/08/19 0304 03/09/19 0250  NA 131*  131* 134* 133* 134*  K 3.6 4.5 5.5* 5.3* 4.8  CL 95* 92* 95* 96* 97*  CO2 '27 29 30 27 28  ' GLUCOSE 263* 239* 203* 263* 103*  BUN 55* 55* 59* 71* 72*  CREATININE 1.76* 1.95* 1.86* 2.11* 1.83*  CALCIUM 8.5* 8.8* 8.8* 8.8* 8.5*  MG  --  2.4 2.4 2.5* 2.4    Liver Function Tests: Recent Labs  Lab 03/05/19 0216 03/06/19 0232 03/07/19 0240 03/08/19 0304 03/09/19 0250  AST '25 24 25 ' 36 87*  ALT 53* 50* 43 44 73*  ALKPHOS 103 120 105 104 94  BILITOT 1.1 0.8 1.1 1.3* 1.7*  PROT 7.1 7.6 7.0 7.2 7.4  ALBUMIN 1.9* 2.0* 2.0* 1.9* 2.0*   No results for input(s): LIPASE, AMYLASE in the last 168 hours. No results for input(s): AMMONIA in the last 168 hours.  CBC: Recent Labs  Lab 03/05/19 0216 03/06/19 0232 03/07/19 0240 03/08/19 0304 03/09/19 0250  WBC 14.2* 16.0* 13.5* 12.5* 15.8*  HGB 12.0* 12.5* 12.3* 12.3* 11.8*  HCT 37.3* 39.1 38.8* 38.2* 36.6*  MCV 80.4 80.6 81.5 79.7* 80.8  PLT 311 326 307 283 283    INR: Recent Labs  Lab 03/05/19 0216 03/06/19 0232 03/07/19 0240 03/08/19 0304 03/09/19 0250  INR 3.1* 3.0* 2.6* 2.8* 3.1*    Other results:     Imaging   DG CHEST PORT 1 VIEW  Result Date: 03/09/2019 CLINICAL DATA:  Shortness of breath, history coronary  artery disease, ischemic cardiomyopathy, hypertension, type II diabetes mellitus, chronic kidney disease, atrial flutter, former smoker EXAM: PORTABLE CHEST 1 VIEW COMPARISON:  Portable exam 0806 hours compared to 03/08/2018 and 1 at 0615 hours FINDINGS: LEFT subclavian AICD lead stable with leads projecting over RIGHT atrium, RIGHT ventricle, and coronary sinus. Enlargement of cardiac silhouette post median sternotomy and LVAD. Diffuse infiltrates question pulmonary edema versus multifocal infection. Minimally improved aeration at RIGHT base versus previous study. No gross pleural effusion or pneumothorax. Generally low lung volumes noted. No acute osseous findings. IMPRESSION: Persistent diffuse BILATERAL pulmonary  infiltrates, slightly improved at RIGHT base, question pulmonary edema versus multifocal infection. Electronically Signed   By: Lavonia Dana M.D.   On: 03/09/2019 08:27   DG Chest Port 1 View  Result Date: 03/09/2019 CLINICAL DATA:  Left ventricular assist device. EXAM: PORTABLE CHEST 1 VIEW COMPARISON:  03/07/2019.  CT 03/01/2019. FINDINGS: AICD in stable position. Left ventricular assist device in stable position. Stable cardiomegaly. Unchanged diffuse bilateral severe interstitial prominence. Low lung volumes. No pleural effusion or pneumothorax. IMPRESSION: 1. AICD in stable position. Left ventricular assist device in stable position. Stable cardiomegaly. 2. Diffuse bilateral severe interstitial prominence again noted without interim change. Reference is made to prior CT report of 03/01/2019. Low lung volumes. Electronically Signed   By: Marcello Moores  Register   On: 03/09/2019 07:27     Medications:     Scheduled Medications: . sodium chloride   Intravenous Once  . aspirin  81 mg Oral Daily  . Chlorhexidine Gluconate Cloth  6 each Topical Daily  . dextrose      . feeding supplement (PRO-STAT SUGAR FREE 64)  30 mL Oral TID WC  . gabapentin  600 mg Oral BID  . insulin aspart  0-20 Units Subcutaneous TID WC  . insulin aspart  0-5 Units Subcutaneous QHS  . [START ON 03/10/2019] insulin aspart protamine- aspart  40 Units Subcutaneous Q breakfast  . magnesium oxide  400 mg Oral BID  . mouth rinse  15 mL Mouth Rinse BID  . mexiletine  200 mg Oral Q12H  . pantoprazole  40 mg Oral Daily  . predniSONE  60 mg Oral Q breakfast  . Warfarin - Pharmacist Dosing Inpatient   Does not apply q1800    Infusions:   PRN Medications: acetaminophen, ondansetron (ZOFRAN) IV   Assessment/Plan:    1. Acute hypoxemic respiratory failure: Recently seen in the ER and now admitted from clinic with marked hypoxemic requiring NRBM.  CXR with diffuse bilateral interstitial and airspace disease.  12/27 ESR elevated to  80 => 68 this admission with CRP 23.  PCT elevated mildly 0.67 => 0.7.  Respiratory virus and COVID-19 negative.  Blood cultures NGTD.  Afebrile, WBCs 15 on steroids.  Also noted to be volume overloaded on exam with significant JVD, weight gain, and peripheral edema. High resolution CT chest with diffuse GGO that could be consistent with amiodarone toxicity versus infection.  Autoimmune panel negative.  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  Amiodarone has been stopped and he has been started on steroids. Now on 60 daily - continue to have frequent decompensations with severe hypoxemia requiring BIPAP. Quite lethargic during events. During event yesterday ABG obtained and did not show hypercarbia - will move to ICU. Continue bipap. I will place arterial line to assist with tracking BPs and getting ABGs as needed. - I have asked CCM to re-engage  2. Acute on  chronic systolic CHF: Has HM3 LVAD (6/19).  MDT CRT-D device.  LVAD parameters stable on interrogation.  - Has been diuresed aggressively this admission. Yesterday Creatinine trending up 1.86>2.1 . Lasix on hold. Got some IVF last night, Creatinine 1.8 today - INR goal 2-2.5 with LVAD, continue ASA 81 and adjust warfarin.  - LDH higher at 475 at admission but with acute inflammatory disease, suspect pump thrombus formation less likely. Continues to range 470-480. No other evidence of pump thrombosis - Follow closely  - INR 3.1 today. Discussed dosing with PharmD personally.  3. Atrial fibrillation permanent:  - rate controlled  4. 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 Frequent PVCs.  - On 1/4 started mexiletine 200 mg twice a day to suppress PVCs.   5. AKI on CKD stage 3:  - stable today  6. Diabetes: Blood glucose up with steroids.  - Continue SSI - Continue home 70/30.   7. Severe Malnutrition Albumin 1.9. Consult dietitian   Move to ICU for further management  of tenuous respiratory status. Daughter and wife. Updated.   CRITICAL CARE Performed by: Glori Bickers  Total critical care time: 40 minutes  Critical care time was exclusive of separately billable procedures and treating other patients.  Critical care was necessary to treat or prevent imminent or life-threatening deterioration.  Critical care was time spent personally by me (independent of midlevel providers or residents) on the following activities: development of treatment plan with patient and/or surrogate as well as nursing, discussions with consultants, evaluation of patient's response to treatment, examination of patient, obtaining history from patient or surrogate, ordering and performing treatments and interventions, ordering and review of laboratory studies, ordering and review of radiographic studies, pulse oximetry and re-evaluation of patient's condition.   Length of Stay: 9  Glori Bickers, MD 03/09/2019, 9:07 AM  VAD Team --- VAD ISSUES ONLY--- Pager [phone] (7am - 7am)  Advanced Heart Failure Team  Pager [phone] (M-F; 7a - 4p)  Please contact [facility] for night-coverage after hours (4p -7a ) and weekends on [email or web address]

## 2019-03-09 NOTE — Progress Notes (Signed)
Inpatient Diabetes Program Recommendations  AACE/ADA: New Consensus Statement on Inpatient Glycemic Control (2015)  Target Ranges:  Prepandial:   less than 140 mg/dL      Peak postprandial:   less than 180 mg/dL (1-2 hours)      Critically ill patients:  140 - 180 mg/dL   Lab Results  Component Value Date   GLUCAP 170 (H) 03/09/2019   HGBA1C 9.4 (H) 02/10/2019    Review of Glycemic Control  Diabetes history: DM Outpatient Diabetes medications: 70/30 40 units q am Current orders for Inpatient glycemic control: 70/30 40 units qd + Novolog resistant tid + hs 0-5  Inpatient Diabetes Program Recommendations:   Consider change in 70/30 to 15 units bid ac meals -Decrease Novolog correction to sensitive tid + hs 0-5  Thank you, Bethena Roys E. Marrah Vanevery, RN, MSN, CDE  Diabetes Coordinator Inpatient Glycemic Control Team Team Pager [phone] (8am-5pm) 03/09/2019 10:16 AM

## 2019-03-09 NOTE — Progress Notes (Signed)
PULMONARY / CRITICAL CARE MEDICINE   Name: [name] [name] MRN: [id number] DOB: [birth year]    ADMISSION DATE:  02/27/2019 CONSULTATION DATE:  02/04/2019, 03/09/19  REFERRING MD:  Bensimhon  CHIEF COMPLAINT:  hypoxemia  HISTORY OF PRESENT ILLNESS:   34 yoM with extensive cardiac history with LVAD presenting with acute onset hypoxia possibly concerning for HF exacerbation vs amiodarone toxicity vs unknown process.  COVID negative x 3. 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   PAST MEDICAL HISTORY :  He  has a past medical history of AICD (automatic cardioverter/defibrillator) present (02/05/2014), Atrial flutter ([place]) (04/2012), CAD (coronary artery disease) (2992,4268 X 2 ), CHF (congestive heart failure) ([place]), Chronic anticoagulation, Chronic systolic heart failure ([place]), CKD (chronic kidney disease), Diabetic retinopathy ([place]), DM type 2 (diabetes mellitus, type 2) ([place]), HTN (hypertension), Hypercholesteremia, ICD (implantable cardiac defibrillator) in place, Ischemic cardiomyopathy (March 2015), Nephrolithiasis, and Ventricular tachycardia ([place]).  PAST SURGICAL HISTORY: He  has a past surgical history that includes Percutaneous coronary stent intervention (pci-s) (January 2002); Cardiac defibrillator placement (2007); Percutaneous coronary stent intervention (pci-s) (June 2002); Percutaneous coronary stent intervention (pci-s) (July 2006); Biv icd genertaor change out (02/05/2014); Atrial flutter ablation (N/A, 05/19/2012); bi-ventricular implantable cardioverter defibrillator upgrade (N/A, 02/05/2014); RIGHT/LEFT HEART CATH AND CORONARY ANGIOGRAPHY (N/A, 02/18/2017); RIGHT HEART CATH (N/A, 07/22/2017); IR US Guide Vasc Access Right (08/12/2017); IR Fluoro Guide CV Line Right (08/12/2017); RIGHT HEART CATH (N/A, 08/13/2017); Insertion of implantable left ventricular assist device (N/A, 08/16/2017); TEE without cardioversion (N/A, 08/16/2017);  Tricuspid valve replacement (N/A, 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  Allergies  Allergen Reactions  . Penicillins Hives, Itching and Other (See Comments)    Did it involve swelling of the face/tongue/throat, SOB, or low BP? No Did it involve sudden or severe rash/hives, skin peeling, or any reaction on the inside of your mouth or nose? No Did you need to seek medical attention at a hospital or doctor's office? Unknown When did it last happen?childhood allergy If all above answers are "NO", may proceed with cephalosporin use.    Marland Kitchen Alcohol-Sulfur [Sulfur] Other (See Comments)    Burns skin    No current facility-administered medications on file prior to encounter.   Current Outpatient Medications on File Prior to Encounter  Medication Sig  . acetaminophen (TYLENOL) 500 MG tablet Take 1,000 mg by mouth every 6 (six) hours as needed for moderate pain or headache.   Marland Kitchen ascorbic acid (VITAMIN C) 500 MG tablet Take 1 tablet (500 mg total) by mouth 2 (two) times daily.  Marland Kitchen aspirin 81 MG chewable tablet Chew 1 tablet (81 mg total) by mouth daily.  Marland Kitchen gabapentin (NEURONTIN) 300 MG capsule Take 2 capsules (600 mg total) by mouth 2 (two) times daily.  . insulin NPH-regular Human (70-30) 100 UNIT/ML injection Inject 40 Units into the skin daily.   Marland Kitchen losartan (COZAAR) 25 MG tablet Take 1 tablet (25 mg total) by mouth daily.  . magnesium oxide (MAG-OX) 400 (241.3 Mg) MG tablet Take 1 tablet (400 mg total) by mouth 2 (two) times daily.  . Multiple Vitamin (MULTIVITAMIN WITH MINERALS) TABS tablet Take 1 tablet by mouth daily.  . pantoprazole (PROTONIX) 40 MG tablet Take 1 tablet (40 mg total) by mouth daily.  . predniSONE (DELTASONE) 20 MG tablet Take 3 tablets (60 mg total) by mouth daily with breakfast for 7 days, THEN 2 tablets (  40 mg total) daily with breakfast.  .  torsemide (DEMADEX) 20 MG tablet Take 1 tablet (20 mg total) by mouth every Monday, Wednesday, and Friday.  . triamcinolone (NASACORT ALLERGY 24HR) 55 MCG/ACT AERO nasal inhaler Place 2 sprays into the nose daily.  Marland Kitchen warfarin (COUMADIN) 2.5 MG tablet Take on tablet daily or as directed (Patient taking differently: Take 2.5 mg by mouth daily. )  . amiodarone (PACERONE) 200 MG tablet Take 100-200 mg by mouth See admin instructions. Take 1 tablet (200 mg totally) by mouth in the morning; take 0.5 tablet (100 mg totally) in the evening  . Insulin Pen Needle (COMFORT EZ PEN NEEDLES) 32G X 6 MM MISC 1 application by Does not apply route at bedtime.    FAMILY HISTORY:  His He indicated that his mother is alive. He indicated that his father is deceased. He indicated that four of his five brothers are alive. He indicated that his maternal grandmother is deceased. He indicated that his maternal grandfather is deceased. He indicated that his paternal grandmother is deceased. He indicated that his paternal grandfather is deceased. He indicated that the status of his daughter is unknown. He indicated that the status of his son is unknown. He indicated that the status of his neg hx is unknown.   SOCIAL HISTORY: He  reports that he quit smoking about 19 years ago. His smoking use included cigarettes. He has a 29.00 pack-year smoking history. He has never used smokeless tobacco. He reports that he does not drink alcohol or use drugs.   ROS/SUBJECTIVE:  Hypoxemic this morning requiring bag mask then placed on bipap. Pt reports some shortness of breath with some improvement on bipap  VITAL SIGNS: BP 126/72 (BP Location: Left Arm)   Pulse 68   Temp (!) 97.5 F (36.4 C) (Axillary)   Resp (!) 23   Ht 5\' 10"  (1.778 m)   Wt 116.6 kg   SpO2 (!) 88%   BMI 36.88 kg/m   HEMODYNAMICS:    VENTILATOR SETTINGS: FiO2 (%):  [65 %-80 %] 80 %  INTAKE / OUTPUT: I/O last 3 completed shifts: In: 600 [P.O.:600] Out:  900 [Urine:900]  PHYSICAL EXAMINATION: General:  Shivering, uncomfortable Neuro:  Alert and oriented, follows commands, answers questions appropriately, shivering Cardiovascular:  lvad hum,  Lungs:  Coarse sounds throughout Abdomen:  NTND Skin:  Warm, dry  LABS:  BMET Recent Labs  Lab 03/07/19 0240 03/08/19 0304 03/09/19 0250  NA 134* 133* 134*  K 5.5* 5.3* 4.8  CL 95* 96* 97*  CO2 30 27 28   BUN 59* 71* 72*  CREATININE 1.86* 2.11* 1.83*  GLUCOSE 203* 263* 103*    Electrolytes Recent Labs  Lab 03/07/19 0240 03/08/19 0304 03/09/19 0250  CALCIUM 8.8* 8.8* 8.5*  MG 2.4 2.5* 2.4    CBC Recent Labs  Lab 03/07/19 0240 03/08/19 0304 03/09/19 0250  WBC 13.5* 12.5* 15.8*  HGB 12.3* 12.3* 11.8*  HCT 38.8* 38.2* 36.6*  PLT 307 283 283    Coag's Recent Labs  Lab 03/07/19 0240 03/08/19 0304 03/09/19 0250  INR 2.6* 2.8* 3.1*    Sepsis Markers No results for input(s): LATICACIDVEN, PROCALCITON, O2SATVEN in the last 168 hours.  ABG Recent Labs  Lab 03/03/19 0317 03/04/19 0454 03/08/19 1330  PHART 7.505* 7.518* 7.400  PCO2ART 35.7 35.3 37.4  PO2ART 52.0* 55.0* 47.7*    Liver Enzymes Recent Labs  Lab 03/07/19 0240 03/08/19 0304 03/09/19 0250  AST 25 36 87*  ALT 43 44  73*  ALKPHOS 105 104 94  BILITOT 1.1 1.3* 1.7*  ALBUMIN 2.0* 1.9* 2.0*    Cardiac Enzymes No results for input(s): TROPONINI, PROBNP in the last 168 hours.  Glucose Recent Labs  Lab 03/08/19 1315 03/08/19 1611 03/08/19 2110 03/09/19 0624 03/09/19 0721 03/09/19 0735  GLUCAP 142* 108* 72 90 52* 170*    Imaging DG Chest Port 1 View  Result Date: 03/09/2019 CLINICAL DATA:  Left ventricular assist device. EXAM: PORTABLE CHEST 1 VIEW COMPARISON:  03/07/2019.  CT 03/01/2019. FINDINGS: AICD in stable position. Left ventricular assist device in stable position. Stable cardiomegaly. Unchanged diffuse bilateral severe interstitial prominence. Low lung volumes. No pleural effusion or  pneumothorax. IMPRESSION: 1. AICD in stable position. Left ventricular assist device in stable position. Stable cardiomegaly. 2. Diffuse bilateral severe interstitial prominence again noted without interim change. Reference is made to prior CT report of 03/01/2019. Low lung volumes. 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 Initially some component of concomitant infection possible but workup negative. Increased O2 requirements, intermittently requiring bipap, No CO2 retention, On prednisone 60mg  with initial plan for around one week at this dose followed by 40 for one week then decrease by 10mg  weekly.   P:   -aeration on radiography seems to have improved with diuresis, recommend further diuresis if appropriate -continue prednisone 60mg  -currently on bipap will transition back to HFNC if/when able -follow respiratory status closely and assess need for intubation   CARDIOVASCULAR A:  Acute on chronic HFrEF: cr stable, on MWF torsemide 20mg , off bp meds, LVAD functioning appropriately, volume status difficult to assess this am.   P:  -cardiology following diurese if able  RENAL A:   AKI: baseline cr around 1.4, stable today at 1.8 P:   -continue to monitor  FAMILY  - Updates:   - Inter-disciplinary family meet or Palliative Care meeting due by:  day 7  Pulmonary and [place] Pager: [phone] Vickki Muff MD PGY-3 Internal Medicine Pager # [phone]   03/09/2019, 7:48 AM

## 2019-03-09 NOTE — Progress Notes (Signed)
LVAD Coordinator Rounding Note:  Admitted 02/18/2019 per Dr. 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  HM III LVAD implanted on 08/16/17 by Dr. Prescott Gum  under Destination Therapy criteria.  Pt awake, BiPap intact, lying in 2H ICU bed. Pt states he doesn't remember what happened earlier this am, says he is feeling "ok" at this time. Only complaint is "I'm dry". Checked with nurse, pt is NPO at this time, explained to patient NPO status and reasons why, pt verbalized understanding of same.   Vital signs: Temp: 98.7 HR: 92 Doppler Pressure:  78 Automatic BP:  87/63 (72) O2 Sat: 65% BiPap Wt: 236.5>238.7>248.9>248.9>247>244.9>251>254 lbs   LVAD interrogation reveals:  Speed: 5100 Flow: 4.2 Power:  3.6w PI: 4.8 Alarms: none Events:  60 PI events on 03/08/19 Hematocrit: 20 - do not adjust  Fixed speed: 5100  Low speed limit: 4800   Drive Line:  Dressing C/D/I; anchor intact and accurately applied. Weekly dressing changes per [facility] nurse; next dressing change due 1/11//21.  Labs:  LDH trend:  475>352>347>360>328>365>396>485  INR trend: 2.9>3.1>3.4>3.2>3.1>3.0>2.6>3.1  WBC trend: 10.0>12.8>15.2>12.7>14.2>16.0>13.5>15.8  Cultures: 02/23/2019 - respiratory panel>>negative final 02/24/2019 - blood cxs>negative/final   Anticoagulation Plan: -INR Goal: 2.0 - 2.5 -ASA Dose: 81 mg daily  Device:Medtronic 3 Lead Therapies: ON:  VF 200 bpm, FVT 200 - 240; VT 158 - 200 - ATP Therapies: AT/AF monitor on 171 bpm Pacing: DDD 60 Last check: 11/10/18  Plan/Recommendations:  1. Page VAD coordinator with any VAD equipment or drive line issues. 2. Weekly dressing changes per BS nurse; next change due 03/13/19.  Zada Girt RN, VAD Coordinator VAD Pager: [phone]

## 2019-03-09 NOTE — Progress Notes (Signed)
ANTICOAGULATION CONSULT NOTE - [place] for warfarin Indication: atrial fibrillation/LVAD  Allergies  Allergen Reactions  . Penicillins Hives, Itching and Other (See Comments)    Did it involve swelling of the face/tongue/throat, SOB, or low BP? No Did it involve sudden or severe rash/hives, skin peeling, or any reaction on the inside of your mouth or nose? No Did you need to seek medical attention at a hospital or doctor's office? Unknown When did it last happen?childhood allergy If all above answers are "NO", may proceed with cephalosporin use.    Marland Kitchen Alcohol-Sulfur [Sulfur] Other (See Comments)    Burns skin    Patient Measurements: Height: 5\' 10"  (177.8 cm) Weight: 257 lb 0.9 oz (116.6 kg) IBW/kg (Calculated) : 73  Vital Signs: Temp: 98.7 F (37.1 C) (01/07 1200) Temp Source: Axillary (01/07 1200) BP: 94/84 (01/07 1200) Pulse Rate: 75 (01/07 1215)  Labs: Recent Labs    03/07/19 0240 03/08/19 0304 03/09/19 0250  HGB 12.3* 12.3* 11.8*  HCT 38.8* 38.2* 36.6*  PLT 307 283 283  LABPROT 27.6* 29.6* 32.0*  INR 2.6* 2.8* 3.1*  CREATININE 1.86* 2.11* 1.83*    Estimated Creatinine Clearance: 49.4 mL/min (A) (by C-G formula based on SCr of 1.83 mg/dL (H)).   Medical History: Past Medical History:  Diagnosis Date  . AICD (automatic cardioverter/defibrillator) present 02/05/2014   Upgrade to Medtronic biventricular ICD, serial number  [id number]   . Atrial flutter ([place]) 04/2012   s/p TEE-EPS+RFCA 04/2012  . CAD (coronary artery disease) [id number] X 2    RCA-T, 70% PL (off CFX), 99% Prox LAD/90% Dist LAD, S/P TAXUS stent x 2  . CHF (congestive heart failure) ([place])   . Chronic anticoagulation   . Chronic systolic heart failure ([place])   . CKD (chronic kidney disease)   . Diabetic retinopathy ([place])   . DM type 2 (diabetes mellitus, type 2) ([facility])    insulin dependent  . HTN (hypertension)   . Hypercholesteremia    ablation  . ICD  (implantable cardiac defibrillator) in place   . Ischemic cardiomyopathy March 2015   20-25% 2D   . Nephrolithiasis   . 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   INR up to 3.1 from 2.8. Hgb 11.8, plt 283. No s/sx of bleeding or infusion issues. Oral intake documented at 100% yesterday.  *PTA Warfarin dose= 2.5mg  daily  Goal of Therapy:  INR 2.0-2.5 Monitor platelets by anticoagulation protocol: Yes   Plan:  -Hold warfarin dose tonight to help INR trend downward -Daily INR  Antonietta Jewel, PharmD, BCCCP Clinical Pharmacist  Phone: [phone]  Please check AMION for all [place] phone numbers After 10:00 PM, call [place] [phone] 03/09/2019       2:17 PM

## 2019-03-09 NOTE — Progress Notes (Signed)
OT Cancellation Note  Patient Details Name: [name] [name] MRN: [id number] DOB: 09-29-1950   Cancelled Treatment:    Reason Eval/Treat Not Completed: Medical issues which prohibited therapy. Pt with increased hypoxia, transferred to [place] and placed on bipap. Will follow.  Malka So 03/09/2019, 12:55 PM  Nestor Lewandowsky, OTR/L Acute Rehabilitation Services Pager: [phone] Office: [phone]

## 2019-03-09 NOTE — Telephone Encounter (Signed)
2C nurse called VAD pager to report pt's PIs trending up and is currently 8.4 with flow 3.3. Auto cuff MAP 90s and correlates with doppler. She does say his BP comes down after evening meds which she has not given yet. Pt has had little PO intake today. Dr. [name] updated. Called 2C nurse and instructed her to give 500 cc NS bolus per Dr. [name]. She verbalized understanding of same.  Zada Girt RN, [place] Coordinator [phone]

## 2019-03-09 NOTE — Significant Event (Signed)
Rapid Response Event Note  Responded to [place], per staff and MD, patient went unresponsive, oxygen saturations dropped, he was assisted ventilations via BVM by MD and then placed on BIPAP. Blood Sugar was 52 - treated with 1/2 D50. I briefly glanced on the LVAD numbers - they seemed ok. Patient started to come around, was talking, moving all extremities. I placed a 20G PIV in the RAC (GBR). Patient was transferred to [facility] per HF/VAD Team. I left to attend to another medical emergency.  [name] [name] R

## 2019-03-09 NOTE — Telephone Encounter (Signed)
2C nurse, Arbie Cookey, called VAD coordinator to report pt is crashing. Asked that Dr. [name] be contacted. Called Dr. [name] who is in the hospital, he went immediately to patient's room.   Zada Girt RN, [place] Coordinator [phone]

## 2019-03-10 ENCOUNTER — Inpatient Hospital Stay (HOSPITAL_COMMUNITY): Payer: PPO

## 2019-03-10 DIAGNOSIS — J9600 Acute respiratory failure, unspecified whether with hypoxia or hypercapnia: Secondary | ICD-10-CM

## 2019-03-10 DIAGNOSIS — Z9981 Dependence on supplemental oxygen: Secondary | ICD-10-CM

## 2019-03-10 LAB — PROTIME-INR
INR: 4.7 (ref 0.8–1.2)
INR: 4.8 (ref 0.8–1.2)
Prothrombin Time: 44.6 seconds — ABNORMAL HIGH (ref 11.4–15.2)
Prothrombin Time: 45 seconds — ABNORMAL HIGH (ref 11.4–15.2)

## 2019-03-10 LAB — CBC
HCT: 37.6 % — ABNORMAL LOW (ref 39.0–52.0)
Hemoglobin: 12 g/dL — ABNORMAL LOW (ref 13.0–17.0)
MCH: 25.5 pg — ABNORMAL LOW (ref 26.0–34.0)
MCHC: 31.9 g/dL (ref 30.0–36.0)
MCV: 80 fL (ref 80.0–100.0)
Platelets: 236 10*3/uL (ref 150–400)
RBC: 4.7 MIL/uL (ref 4.22–5.81)
RDW: 18.5 % — ABNORMAL HIGH (ref 11.5–15.5)
WBC: 28.5 10*3/uL — ABNORMAL HIGH (ref 4.0–10.5)
nRBC: 0.1 % (ref 0.0–0.2)

## 2019-03-10 LAB — POCT I-STAT 7, (LYTES, BLD GAS, ICA,H+H)
Acid-Base Excess: 2 mmol/L (ref 0.0–2.0)
Acid-Base Excess: 3 mmol/L — ABNORMAL HIGH (ref 0.0–2.0)
Bicarbonate: 23.9 mmol/L (ref 20.0–28.0)
Bicarbonate: 25 mmol/L (ref 20.0–28.0)
Bicarbonate: 27.6 mmol/L (ref 20.0–28.0)
Calcium, Ion: 1.16 mmol/L (ref 1.15–1.40)
Calcium, Ion: 1.16 mmol/L (ref 1.15–1.40)
Calcium, Ion: 1.19 mmol/L (ref 1.15–1.40)
HCT: 40 % (ref 39.0–52.0)
HCT: 42 % (ref 39.0–52.0)
HCT: 42 % (ref 39.0–52.0)
Hemoglobin: 13.6 g/dL (ref 13.0–17.0)
Hemoglobin: 14.3 g/dL (ref 13.0–17.0)
Hemoglobin: 14.3 g/dL (ref 13.0–17.0)
O2 Saturation: 81 %
O2 Saturation: 90 %
O2 Saturation: 93 %
Patient temperature: 36.3
Patient temperature: 97.4
Patient temperature: 98.2
Potassium: 4.8 mmol/L (ref 3.5–5.1)
Potassium: 5.1 mmol/L (ref 3.5–5.1)
Potassium: 5.2 mmol/L — ABNORMAL HIGH (ref 3.5–5.1)
Sodium: 135 mmol/L (ref 135–145)
Sodium: 137 mmol/L (ref 135–145)
Sodium: 138 mmol/L (ref 135–145)
TCO2: 25 mmol/L (ref 22–32)
TCO2: 26 mmol/L (ref 22–32)
TCO2: 29 mmol/L (ref 22–32)
pCO2 arterial: 33.1 mmHg (ref 32.0–48.0)
pCO2 arterial: 34.6 mmHg (ref 32.0–48.0)
pCO2 arterial: 39.3 mmHg (ref 32.0–48.0)
pH, Arterial: 7.447 (ref 7.350–7.450)
pH, Arterial: 7.451 — ABNORMAL HIGH (ref 7.350–7.450)
pH, Arterial: 7.483 — ABNORMAL HIGH (ref 7.350–7.450)
pO2, Arterial: 40 mmHg — CL (ref 83.0–108.0)
pO2, Arterial: 56 mmHg — ABNORMAL LOW (ref 83.0–108.0)
pO2, Arterial: 62 mmHg — ABNORMAL LOW (ref 83.0–108.0)

## 2019-03-10 LAB — GLUCOSE, CAPILLARY
Glucose-Capillary: 251 mg/dL — ABNORMAL HIGH (ref 70–99)
Glucose-Capillary: 254 mg/dL — ABNORMAL HIGH (ref 70–99)
Glucose-Capillary: 290 mg/dL — ABNORMAL HIGH (ref 70–99)
Glucose-Capillary: 280 mg/dL — ABNORMAL HIGH (ref 70–99)
Glucose-Capillary: 246 mg/dL — ABNORMAL HIGH (ref 70–99)
Glucose-Capillary: 203 mg/dL — ABNORMAL HIGH (ref 70–99)

## 2019-03-10 LAB — COMPREHENSIVE METABOLIC PANEL
ALT: 121 U/L — ABNORMAL HIGH (ref 0–44)
AST: 121 U/L — ABNORMAL HIGH (ref 15–41)
Albumin: 2 g/dL — ABNORMAL LOW (ref 3.5–5.0)
Alkaline Phosphatase: 94 U/L (ref 38–126)
Anion gap: 16 — ABNORMAL HIGH (ref 5–15)
BUN: 80 mg/dL — ABNORMAL HIGH (ref 8–23)
CO2: 21 mmol/L — ABNORMAL LOW (ref 22–32)
Calcium: 8.5 mg/dL — ABNORMAL LOW (ref 8.9–10.3)
Chloride: 95 mmol/L — ABNORMAL LOW (ref 98–111)
Creatinine, Ser: 2.26 mg/dL — ABNORMAL HIGH (ref 0.61–1.24)
GFR calc Af Amer: 33 mL/min — ABNORMAL LOW (ref >60)
GFR calc non Af Amer: 29 mL/min — ABNORMAL LOW (ref >60)
Glucose, Bld: 263 mg/dL — ABNORMAL HIGH (ref 70–99)
Potassium: 5.1 mmol/L (ref 3.5–5.1)
Sodium: 132 mmol/L — ABNORMAL LOW (ref 135–145)
Total Bilirubin: 2.5 mg/dL — ABNORMAL HIGH (ref 0.3–1.2)
Total Protein: 7.4 g/dL (ref 6.5–8.1)

## 2019-03-10 LAB — AMMONIA: Ammonia: 24 umol/L (ref 9–35)

## 2019-03-10 LAB — MRSA PCR SCREENING: MRSA by PCR: NEGATIVE

## 2019-03-10 LAB — PROCALCITONIN
Procalcitonin: 4.91 ng/mL
Procalcitonin: 5.15 ng/mL

## 2019-03-10 LAB — LACTATE DEHYDROGENASE: LDH: 493 U/L — ABNORMAL HIGH (ref 98–192)

## 2019-03-10 LAB — MAGNESIUM: Magnesium: 2.6 mg/dL — ABNORMAL HIGH (ref 1.7–2.4)

## 2019-03-10 MED ORDER — INSULIN ASPART 100 UNIT/ML ~~LOC~~ SOLN
0.0000 [IU] | SUBCUTANEOUS | Status: DC
  Administered 2019-03-10: 8 [IU] via SUBCUTANEOUS
  Administered 2019-03-10: 5 [IU] via SUBCUTANEOUS
  Administered 2019-03-11 (×3): 3 [IU] via SUBCUTANEOUS
  Administered 2019-03-11: 5 [IU] via SUBCUTANEOUS
  Administered 2019-03-11: 3 [IU] via SUBCUTANEOUS
  Administered 2019-03-11: 8 [IU] via SUBCUTANEOUS
  Administered 2019-03-12: 5 [IU] via SUBCUTANEOUS
  Administered 2019-03-12 (×4): 8 [IU] via SUBCUTANEOUS
  Administered 2019-03-12: 5 [IU] via SUBCUTANEOUS
  Administered 2019-03-13 – 2019-03-15 (×13): 3 [IU] via SUBCUTANEOUS
  Administered 2019-03-15: 8 [IU] via SUBCUTANEOUS
  Administered 2019-03-15: 17:00:00 3 [IU] via SUBCUTANEOUS
  Administered 2019-03-15: 8 [IU] via SUBCUTANEOUS
  Administered 2019-03-15: 20:00:00 5 [IU] via SUBCUTANEOUS
  Administered 2019-03-16 (×4): 11 [IU] via SUBCUTANEOUS
  Administered 2019-03-16: 5 [IU] via SUBCUTANEOUS

## 2019-03-10 MED ORDER — VANCOMYCIN HCL 1750 MG/350ML IV SOLN
1750.0000 mg | INTRAVENOUS | Status: DC
  Administered 2019-03-12: 1750 mg via INTRAVENOUS
  Filled 2019-03-10 (×2): qty 350

## 2019-03-10 MED ORDER — SODIUM CHLORIDE 0.9 % IV SOLN
2000.0000 mg | Freq: Two times a day (BID) | INTRAVENOUS | Status: DC
  Administered 2019-03-10 – 2019-03-17 (×14): 2000 mg via INTRAVENOUS
  Filled 2019-03-10 (×17): qty 2

## 2019-03-10 MED ORDER — INSULIN ASPART 100 UNIT/ML ~~LOC~~ SOLN
0.0000 [IU] | SUBCUTANEOUS | Status: DC
  Administered 2019-03-10 (×2): 5 [IU] via SUBCUTANEOUS

## 2019-03-10 MED ORDER — VANCOMYCIN HCL 2000 MG/400ML IV SOLN
2000.0000 mg | Freq: Once | INTRAVENOUS | Status: AC
  Administered 2019-03-10: 2000 mg via INTRAVENOUS
  Filled 2019-03-10: qty 400

## 2019-03-10 NOTE — Progress Notes (Signed)
Results for GURDEEP, MACAULEY (MRN [id number]) as of 03/10/2019 10:25  Ref. Range 03/09/2019 11:29 03/09/2019 16:12 03/09/2019 21:34 03/10/2019 07:03 03/10/2019 07:52  Glucose-Capillary Latest Ref Range: 70 - 99 mg/dL 171 (H) 142 (H) 151 (H) 251 (H) 254 (H)  Noted that blood sugars have been greater than 200 mg/dl.  Recommend increasing Novolog to MODERATE (0-15 units) correction scale every 4 hours if blood sugars continue to be elevated.   Harvel Ricks RN BSN CDE Diabetes Coordinator Pager: [phone]  8am-5pm

## 2019-03-10 NOTE — Progress Notes (Signed)
Attempted to place patient on heated high flow nasal cannula. Arterial blood gas obtained 30 minutes after placing patient on HHFNC. Placed patient back on bipap due to ABG results.

## 2019-03-10 NOTE — Progress Notes (Signed)
Patient's arterial line was oozing at around 7 pm, RT and day shift RN tried to reposition and redress, but arterial line was kinked off. Had to D/C.   Night shift RN notified Dr. [name], and asked if another line needed to be placed. Dr [name] stated he could place a new arterial line tomorrow during the day.   Will continue to monitor patient's doppler pressures and other vitals. Patient currently stable on BiPap.  Kanya Potteiger E Reola Mosher, [place]

## 2019-03-10 NOTE — Progress Notes (Signed)
LVAD Coordinator Rounding Note:  Admitted 02/14/2019 per Dr. 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  HM III LVAD implanted on 08/16/17 by Dr. Prescott Gum  under Destination Therapy criteria.  Pt awake, BiPap intact, lying in bed. Wife at bedside. He states he feels ok and that he is breathing better on Bipap. Portable chest xray completed.    Vital signs: Temp: 98.2 HR: 83 Doppler Pressure: not done Art BP:  90/73 (81) O2 Sat: 98% on 90% FiO2 BiPap Wt: 236.5>238.7>248.9>248.9>247>244.9>251>254>243.6 lbs   LVAD interrogation reveals:  Speed: 5100 Flow: 3.8 Power:  3.5w PI: 6.2 Alarms: none Events:  1 PI event today 11 PI events yesterday Hematocrit: 20 - do not adjust  Fixed speed: 5100  Low speed limit: 4800   Drive Line:  Dressing C/D/I; anchor intact and accurately applied. Weekly dressing changes per [facility] nurse; next dressing change due 1/11//21.  Labs:  LDH trend:  475>352>347>360>328>365>396>485>493  INR trend: 2.9>3.1>3.4>3.2>3.1>3.0>2.6>3.1>4.7  WBC trend: 10.0>12.8>15.2>12.7>14.2>16.0>13.5>15.8>28.5  Cultures: 02/01/2019 - respiratory panel>>negative final 02/12/2019 - blood cxs>negative/final   Anticoagulation Plan: -INR Goal: 2.0 - 2.5 -ASA Dose: 81 mg daily  Device:Medtronic 3 Lead Therapies: ON:  VF 200 bpm, FVT 200 - 240; VT 158 - 200 - ATP Therapies: AT/AF monitor on 171 bpm Pacing: DDD 60 Last check: 11/10/18  Plan/Recommendations:  1. Page VAD coordinator with any VAD equipment or drive line issues. 2. Weekly dressing changes per BS nurse; next change due 03/13/19.  Emerson Monte RN [place] Coordinator  Office: [phone]  24/7 Pager: [phone]

## 2019-03-10 NOTE — Progress Notes (Addendum)
PULMONARY / CRITICAL CARE MEDICINE   Name: [name] [name] MRN: [id number] DOB: [birth year]    ADMISSION DATE:  02/27/2019 CONSULTATION DATE:  02/04/2019, 03/09/19  REFERRING MD:  Bensimhon  CHIEF COMPLAINT:  hypoxemia  HISTORY OF PRESENT ILLNESS:   34 yoM with extensive cardiac history with LVAD presenting with acute onset hypoxia possibly concerning for HF exacerbation vs amiodarone toxicity vs unknown process.  COVID negative x 3. 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   PAST MEDICAL HISTORY :  He  has a past medical history of AICD (automatic cardioverter/defibrillator) present (02/05/2014), Atrial flutter ([place]) (04/2012), CAD (coronary artery disease) (2992,4268 X 2 ), CHF (congestive heart failure) ([place]), Chronic anticoagulation, Chronic systolic heart failure ([place]), CKD (chronic kidney disease), Diabetic retinopathy ([place]), DM type 2 (diabetes mellitus, type 2) ([place]), HTN (hypertension), Hypercholesteremia, ICD (implantable cardiac defibrillator) in place, Ischemic cardiomyopathy (March 2015), Nephrolithiasis, and Ventricular tachycardia ([place]).  PAST SURGICAL HISTORY: He  has a past surgical history that includes Percutaneous coronary stent intervention (pci-s) (January 2002); Cardiac defibrillator placement (2007); Percutaneous coronary stent intervention (pci-s) (June 2002); Percutaneous coronary stent intervention (pci-s) (July 2006); Biv icd genertaor change out (02/05/2014); Atrial flutter ablation (N/A, 05/19/2012); bi-ventricular implantable cardioverter defibrillator upgrade (N/A, 02/05/2014); RIGHT/LEFT HEART CATH AND CORONARY ANGIOGRAPHY (N/A, 02/18/2017); RIGHT HEART CATH (N/A, 07/22/2017); IR US Guide Vasc Access Right (08/12/2017); IR Fluoro Guide CV Line Right (08/12/2017); RIGHT HEART CATH (N/A, 08/13/2017); Insertion of implantable left ventricular assist device (N/A, 08/16/2017); TEE without cardioversion (N/A, 08/16/2017);  Tricuspid valve replacement (N/A, 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  Allergies  Allergen Reactions  . Penicillins Hives, Itching and Other (See Comments)    Did it involve swelling of the face/tongue/throat, SOB, or low BP? No Did it involve sudden or severe rash/hives, skin peeling, or any reaction on the inside of your mouth or nose? No Did you need to seek medical attention at a hospital or doctor's office? Unknown When did it last happen?childhood allergy If all above answers are "NO", may proceed with cephalosporin use.    Marland Kitchen Alcohol-Sulfur [Sulfur] Other (See Comments)    Burns skin    No current facility-administered medications on file prior to encounter.   Current Outpatient Medications on File Prior to Encounter  Medication Sig  . acetaminophen (TYLENOL) 500 MG tablet Take 1,000 mg by mouth every 6 (six) hours as needed for moderate pain or headache.   Marland Kitchen ascorbic acid (VITAMIN C) 500 MG tablet Take 1 tablet (500 mg total) by mouth 2 (two) times daily.  Marland Kitchen aspirin 81 MG chewable tablet Chew 1 tablet (81 mg total) by mouth daily.  Marland Kitchen gabapentin (NEURONTIN) 300 MG capsule Take 2 capsules (600 mg total) by mouth 2 (two) times daily.  . insulin NPH-regular Human (70-30) 100 UNIT/ML injection Inject 40 Units into the skin daily.   Marland Kitchen losartan (COZAAR) 25 MG tablet Take 1 tablet (25 mg total) by mouth daily.  . magnesium oxide (MAG-OX) 400 (241.3 Mg) MG tablet Take 1 tablet (400 mg total) by mouth 2 (two) times daily.  . Multiple Vitamin (MULTIVITAMIN WITH MINERALS) TABS tablet Take 1 tablet by mouth daily.  . pantoprazole (PROTONIX) 40 MG tablet Take 1 tablet (40 mg total) by mouth daily.  . predniSONE (DELTASONE) 20 MG tablet Take 3 tablets (60 mg total) by mouth daily with breakfast for 7 days, THEN 2 tablets (  40 mg total) daily with breakfast.  .  torsemide (DEMADEX) 20 MG tablet Take 1 tablet (20 mg total) by mouth every Monday, Wednesday, and Friday.  . triamcinolone (NASACORT ALLERGY 24HR) 55 MCG/ACT AERO nasal inhaler Place 2 sprays into the nose daily.  Marland Kitchen warfarin (COUMADIN) 2.5 MG tablet Take on tablet daily or as directed (Patient taking differently: Take 2.5 mg by mouth daily. )  . amiodarone (PACERONE) 200 MG tablet Take 100-200 mg by mouth See admin instructions. Take 1 tablet (200 mg totally) by mouth in the morning; take 0.5 tablet (100 mg totally) in the evening  . Insulin Pen Needle (COMFORT EZ PEN NEEDLES) 32G X 6 MM MISC 1 application by Does not apply route at bedtime.    FAMILY HISTORY:  His He indicated that his mother is alive. He indicated that his father is deceased. He indicated that four of his five brothers are alive. He indicated that his maternal grandmother is deceased. He indicated that his maternal grandfather is deceased. He indicated that his paternal grandmother is deceased. He indicated that his paternal grandfather is deceased. He indicated that the status of his daughter is unknown. He indicated that the status of his son is unknown. He indicated that the status of his neg hx is unknown.   SOCIAL HISTORY: He  reports that he quit smoking about 19 years ago. His smoking use included cigarettes. He has a 29.00 pack-year smoking history. He has never used smokeless tobacco. He reports that he does not drink alcohol or use drugs.   SUBJECTIVE: / Overnight Changes Remains alert, oriented  Appropriate. States he is hungry  Remains on 90% via BiPAP, oxygen 59 per ABG, 7.44 with CO2 of 34.6 Sats drop with conversation No CXR, >> ordered now New bump in WBC>> 28.6 LDH 493 INR 4.7   VITAL SIGNS: BP (!) 70/50   Pulse (!) 144   Temp 98.2 F (36.8 C) (Oral)   Resp (!) 25   Ht _0  (1.778 m)   Wt 110.5 kg   SpO2 (!) 87%   BMI 34.95 kg/m   HEMODYNAMICS:    VENTILATOR SETTINGS: FiO2 (%):  [70  %-100 %] 90 %  INTAKE / OUTPUT: I/O last 3 completed shifts: In: 427.8 [P.O.:240; I.V.:187.8] Out: 1350 [Urine:1350]  PHYSICAL EXAMINATION: General:  Awake and alert, on BiPAP, desats with converstion Neuro:  Alert and oriented, follows commands, answers questions appropriately Cardiovascular:  lvad hum,  Lungs:  Bilateral chest excursion, Coarse BS throughout, diminished per bases Abdomen:  NTND, Obese, BS + Skin:  Warm, dry, no rash or lesions Extremities: Compression tights on, edema  LABS:  BMET Recent Labs  Lab 03/08/19 0304 03/09/19 0250 03/10/19 0015 03/10/19 0316 03/10/19 0510  NA 133* 134* 137 132* 135  K 5.3* 4.8 5.1 5.1 5.2*  CL 96* 97*  --  95*  --   CO2 27 28  --  21*  --   BUN 71* 72*  --  80*  --   CREATININE 2.11* 1.83*  --  2.26*  --   GLUCOSE 263* 103*  --  263*  --     Electrolytes Recent Labs  Lab 03/08/19 0304 03/09/19 0250 03/10/19 0316  CALCIUM 8.8* 8.5* 8.5*  MG 2.5* 2.4 2.6*    CBC Recent Labs  Lab 03/08/19 0304 03/09/19 0250 03/10/19 0015 03/10/19 0316 03/10/19 0510  WBC 12.5* 15.8*  --  28.5*  --   HGB 12.3* 11.8* 14.3 12.0* 14.3  HCT  38.2* 36.6* 42.0 37.6* 42.0  PLT 283 283  --  236  --     Coag's Recent Labs  Lab 03/08/19 0304 03/09/19 0250 03/10/19 0316  INR 2.8* 3.1* 4.7*    Sepsis Markers No results for input(s): LATICACIDVEN, PROCALCITON, O2SATVEN in the last 168 hours.  ABG Recent Labs  Lab 03/09/19 1856 03/10/19 0015 03/10/19 0510  PHART 7.482* 7.483* 7.447  PCO2ART 37.1 33.1 34.6  PO2ART 56.0* 40.0* 56.0*    Liver Enzymes Recent Labs  Lab 03/08/19 0304 03/09/19 0250 03/10/19 0316  AST 36 87* 121*  ALT 44 73* 121*  ALKPHOS 104 94 94  BILITOT 1.3* 1.7* 2.5*  ALBUMIN 1.9* 2.0* 2.0*    Cardiac Enzymes No results for input(s): TROPONINI, PROBNP in the last 168 hours.  Glucose Recent Labs  Lab 03/09/19 0735 03/09/19 1129 03/09/19 1612 03/09/19 2134 03/10/19 0703 03/10/19 0752   GLUCAP 170* 171* 142* 151* 251* 254*    Imaging No results found.  STUDIES:  Significant Diagnostic Tests:  12/29 TTE  1. Left ventricular ejection fraction, by visual estimation, is 20 to 25%. The left ventricle has severely decreased function. Left ventricular septal wall thickness was decreased. Mildly increased left ventricular posterior wall thickness. There is  mildly increased left ventricular wall thickness. 2. Left ventricular diastolic parameters are indeterminate. 3. Global right ventricle has severely reduced systolic function.The right ventricular size is moderately enlarged. 4. Trivial mitral valve regurgitation. 5. Tricuspid valve regurgitation mild-moderate. 6. Tricuspid valve regurgitation mild-moderate. 7. Mildly elevated pulmonary artery systolic pressure. 8. A pacer wire is visualized. 9. The inferior vena cava is dilated in size with <50% respiratory variability, suggesting right atrial pressure of 15 mmHg.  12/30 HRCT Chest 1. Diffuse patchy pulmonary parenchymal ground-glass, scattered consolidation and suspected mild architectural distortion, with a slight upper/midlung zone predominance. Findings are new/progressive from 05/03/2018 and can be seen with amiodarone toxicity. Pneumonia is not excluded. 2. Trace bilateral pleural effusions. 3. Increased prominence of mediastinal lymph nodes, likely reactive. 4. Liver margin may be slightly irregular, raising suspicion for cirrhosis. 5. Gallbladder sludge and/or stones. 6. Aortic atherosclerosis (ICD10-I70.0). 7. Enlarged pulmonic trunk, indicative of pulmonary arterial Hypertension.   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  Initially some component of concomitant infection initial  workup negative, but significant WBC bump overnight. ( Steroids started 1/7) Continued  Increased O2 requirements, Now requiring continuous bipap , No  CO2 retention, Prednisone changed to IV Solumedrol 1/8 Bump in creatinine>> poor response to diuresis 1/7, no further diuresis 1/8 CXR 1/8 with  Progressive diffuse severe bilateral pulmonary infiltrates/edema. Low lung volumes. P:  - CXR now >> progressive bilateral pulmonary infiltrates - Initiation of vanc and Merrem per cards orders - Trend WBC and fever curve - PCT today and in am - Sputum culture as able - Solumedrol 60 Q 12 per order - currently on bipap >> Will need to evaluate CXR now >>  need for intubation - Trend CXR - ABG prn - Continuous BiPAP for now, review CXR and evaluate need for intubation - Will get initial auto immune labs (  ANCA, ANA, HSP, and GBM) to evaluate for auto immune source.  CARDIOVASCULAR A:  Acute on chronic HFrEF: cr increased overnight ( 2.26 from 1.86), No diuresis today per cards,  off bp meds, LVAD functioning appropriately,   P:  -cardiology following diurese as renal function allows - Holding diuresis 1/8 MAP goal > 65  RENAL A:   AKI: baseline cr around 1.4, Significant increase overnight 1/8 to 2.26 Potassium high upper normal at 5.1 P:   - Trend BMET - continue to monitor UO - Replete electrolytes as needed - MAP goal of 65 - Avoid nephrotoxic medications  Elevated LFT's AST 121 ALT 121 Ammonia 24 INR 4.7 Plan - Trend CMET - Vitamin K 1 mg as ordered per cards - Trend INR  Hyperglycemia  ? Steroid related Plan Monitor CBG's  Consider additional insulin if persists    Will get CXR , low thresh hold for intubation if  progression of infiltrates.  FAMILY  - Updates:   - Inter-disciplinary family meet or Palliative Care meeting due by:  day 7  12 Minutes of APP [place], MSN, AGACNP-BC [place] Pager # [phone] After 4 pm please call [phone] 03/10/2019 8:46 AM

## 2019-03-10 NOTE — Progress Notes (Signed)
Pharmacy Antibiotic Note  [name] [name] is a 69 y.o. male admitted on 02/27/2019 with pneumonia.  Pharmacy has been consulted for vancomycin and meropenem dosing.  Has no history of cephalosporin use but PCN allergy listed with hives/itching. WBC increasing to 28.5 - on methylprednisolone. PCT 4.91. Afebrile. MRSA PCR neg. No growth on previously ordered cx. Scr increased from 1.83>2.26 (CrCl 39 mL/min).  Plan: Vancomycin 2 g IV once then 1750 mg IV every 48 hours (estAUC 507) Meropenem 2g IV every 12 hours Monitor renal fx, clinical pic, cx results, and vanc levels as appropriate.  Height: 5\' 10"  (177.8 cm) Weight: 243 lb 9.7 oz (110.5 kg) IBW/kg (Calculated) : 73  Temp (24hrs), Avg:98.1 F (36.7 C), Min:97.3 F (36.3 C), Max:98.7 F (37.1 C)  Recent Labs  Lab 03/06/19 0232 03/07/19 0240 03/08/19 0304 03/09/19 0250 03/10/19 0316  WBC 16.0* 13.5* 12.5* 15.8* 28.5*  CREATININE 1.95* 1.86* 2.11* 1.83* 2.26*    Estimated Creatinine Clearance: 38.9 mL/min (A) (by C-G formula based on SCr of 2.26 mg/dL (H)).    Allergies  Allergen Reactions  . Penicillins Hives, Itching and Other (See Comments)    Did it involve swelling of the face/tongue/throat, SOB, or low BP? No Did it involve sudden or severe rash/hives, skin peeling, or any reaction on the inside of your mouth or nose? No Did you need to seek medical attention at a hospital or doctor's office? Unknown When did it last happen?childhood allergy If all above answers are "NO", may proceed with cephalosporin use.    Marland Kitchen Alcohol-Sulfur [Sulfur] Other (See Comments)    Burns skin    Antimicrobials this admission: Vancomycin 1/8 >>  Meropenem 1/8 >>   Dose adjustments this admission: N/A  Microbiology results: 12/29 BCx: NGTD 12/29 Resp PCR: neg  1/8 Sputum: ordered  1/8 MRSA PCR: neg  Thank you for allowing pharmacy to be a part of this patient's care.  Antonietta Jewel, PharmD, BCCCP Clinical Pharmacist   Phone: [phone]  Please check AMION for all [place] phone numbers After 10:00 PM, call [place] [phone] 03/10/2019 7:51 AM

## 2019-03-10 NOTE — Progress Notes (Addendum)
ANTICOAGULATION CONSULT NOTE - [place] for warfarin Indication: atrial fibrillation/LVAD  Allergies  Allergen Reactions  . Penicillins Hives, Itching and Other (See Comments)    Did it involve swelling of the face/tongue/throat, SOB, or low BP? No Did it involve sudden or severe rash/hives, skin peeling, or any reaction on the inside of your mouth or nose? No Did you need to seek medical attention at a hospital or doctor's office? Unknown When did it last happen?childhood allergy If all above answers are "NO", may proceed with cephalosporin use.    Marland Kitchen Alcohol-Sulfur [Sulfur] Other (See Comments)    Burns skin    Patient Measurements: Height: 5\' 10"  (177.8 cm) Weight: 243 lb 9.7 oz (110.5 kg) IBW/kg (Calculated) : 73  Vital Signs: Temp: 98.2 F (36.8 C) (01/08 0748) Temp Source: Oral (01/08 0748) Pulse Rate: 137 (01/08 0700)  Labs: Recent Labs    03/08/19 0304 03/09/19 0250 03/10/19 0015 03/10/19 0316 03/10/19 0510  HGB 12.3* 11.8* 14.3 12.0* 14.3  HCT 38.2* 36.6* 42.0 37.6* 42.0  PLT 283 283  --  236  --   LABPROT 29.6* 32.0*  --  44.6*  --   INR 2.8* 3.1*  --  4.7*  --   CREATININE 2.11* 1.83*  --  2.26*  --     Estimated Creatinine Clearance: 38.9 mL/min (A) (by C-G formula based on SCr of 2.26 mg/dL (H)).   Medical History: Past Medical History:  Diagnosis Date  . AICD (automatic cardioverter/defibrillator) present 02/05/2014   Upgrade to Medtronic biventricular ICD, serial number  [id number]   . Atrial flutter ([place]) 04/2012   s/p TEE-EPS+RFCA 04/2012  . CAD (coronary artery disease) [id number] X 2    RCA-T, 70% PL (off CFX), 99% Prox LAD/90% Dist LAD, S/P TAXUS stent x 2  . CHF (congestive heart failure) ([place])   . Chronic anticoagulation   . Chronic systolic heart failure ([place])   . CKD (chronic kidney disease)   . Diabetic retinopathy ([place])   . DM type 2 (diabetes mellitus, type 2) ([facility])    insulin dependent  . HTN  (hypertension)   . Hypercholesteremia    ablation  . ICD (implantable cardiac defibrillator) in place   . Ischemic cardiomyopathy March 2015   20-25% 2D   . Nephrolithiasis   . 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   INR up to 4.7 from 3.1. Hgb 14.3, plt 236. No s/sx of bleeding or infusion issues. Was hypotensive on BiPAP yesterday.   *PTA Warfarin dose= 2.5mg  daily  Goal of Therapy:  INR 2.0-2.5 Monitor platelets by anticoagulation protocol: Yes   Plan:  -Hold warfarin dose tonight to help INR trend downward -Recheck this afternoon and if greater than 5, will order vitamin K -Daily INR  Antonietta Jewel, PharmD, BCCCP Clinical Pharmacist  Phone: [phone]  Please check AMION for all [place] phone numbers After 10:00 PM, call [place] [phone] 03/10/2019       7:51 AM  ADDENDUM INR is 4.8. Will hold on any vitamin K and monitor trend tomorrow.  Antonietta Jewel, PharmD, [place] Clinical Pharmacist

## 2019-03-10 NOTE — Progress Notes (Signed)
OT Cancellation Note  Patient Details Name: [name] [name] MRN: [id number] DOB: 08-28-1950   Cancelled Treatment:    Reason Eval/Treat Not Completed: Patient not medically ready. Pt with significant 02 desats with bed level activity. Will continue to follow.  Malka So 03/10/2019, 12:28 PM  Nestor Lewandowsky, OTR/L Acute Rehabilitation Services Pager: [phone] Office: [phone]

## 2019-03-10 NOTE — Evaluation (Signed)
Physical Therapy Evaluation Patient Details Name: [name] [name] MRN: [id number] DOB: Jan 10, 1951 Today's Date: 03/10/2019   History of Present Illness  69 y.o. male with h/o chronic systolic CHF due to ICM, s/p LVAD HM3 08/16/2017, [name], [name], [name], [name], [name], HTN, HL, and CKD III. He had reported 1-2 weeks of increase cough and weakness. Seen in UC 1 week prior to ED visit and COVID negative.  Symptoms progressed over the course of a week. 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  Clinical Impression  Pt presents to PT with deficits in functional mobility, strength, power, and endurance. Limited evaluation due to pt respiratory status. Pt desaturates some with history taking and less significantly with ROM and strength assessment during session. Pt's cognition appears [facility] and is able to follow commands well. Pt will benefit from further PT evaluation once respiratory status is more stable and the pt is able to tolerate functional mobility without increased risk for desaturating. Pt demonstrates good strength currently and PT anticipates the pt will mobilize well if respiratory status improves.    Follow Up Recommendations Home health PT;Supervision/Assistance - 24 hour(will need to reassess when functional mobility is performed)    Equipment Recommendations  (TBD)    Recommendations for Other Services       Precautions / Restrictions Precautions Precautions: Fall Precaution Comments: LVAD Restrictions Weight Bearing Restrictions: No      Mobility  Bed Mobility Overal bed mobility: (deferred functional mobility in bed due to desaturation)                Transfers                    Ambulation/Gait                Stairs            Wheelchair Mobility    Modified Rankin (Stroke Patients Only)       Balance                                             Pertinent Vitals/Pain  Pain Assessment: No/denies pain    Home Living Family/patient expects to be discharged to:: Private residence Living Arrangements: Spouse/significant other Available Help at Discharge: Family;Available 24 hours/day Type of Home: House Home Access: Stairs to enter Entrance Stairs-Rails: None Entrance Stairs-Number of Steps: 3 Home Layout: One level Home Equipment: Walker - 2 wheels;Cane - single point;Wheelchair - Brewing technologist      Prior Function Level of Independence: Independent         Comments: limited Estate agent Dominance   Dominant Hand: Right    Extremity/Trunk Assessment   Upper Extremity Assessment Upper Extremity Assessment: Generalized weakness(grossly 4/5 BUE)    Lower Extremity Assessment Lower Extremity Assessment: Generalized weakness(grossly 4-/5 BLE)    Cervical / Trunk Assessment Cervical / Trunk Assessment: Normal  Communication   Communication: Other (comment)(on BiPAP)  Cognition Arousal/Alertness: Awake/alert Behavior During Therapy: WFL for tasks assessed/performed Overall Cognitive Status: Within Functional Limits for tasks assessed                                        General Comments General comments (skin  integrity, edema, etc.): pt desaturating to mid-80s with PT taking history, more stable without verbal communication and movement of extremities in bed, however still desaturating some. PT defers functional mobility due to desat during limited bed level activity    Exercises General Exercises - Lower Extremity Ankle Circles/Pumps: AROM;Both;10 reps Gluteal Sets: AROM;Both;5 reps Straight Leg Raises: AROM;Both;5 reps   Assessment/Plan    PT Assessment Patient needs continued PT services  PT Problem List Decreased strength;Decreased activity tolerance;Decreased balance;Decreased mobility;Decreased knowledge of use of DME;Decreased safety awareness;Decreased knowledge of  precautions;Cardiopulmonary status limiting activity       PT Treatment Interventions DME instruction;Gait training;Stair training;Functional mobility training;Therapeutic activities;Therapeutic exercise;Balance training;Neuromuscular re-education;Patient/family education;Wheelchair mobility training    PT Goals (Current goals can be found in the Care Plan section)  Acute Rehab PT Goals Patient Stated Goal: To improve mobility PT Goal Formulation: With patient Time For Goal Achievement: 03/24/19 Potential to Achieve Goals: Good Additional Goals Additional Goal #1: Pt will mobilize in manual wheelchair with use of BUE for 50' without desaturation below 90%, at a supervision level.    Frequency Min 3X/week   Barriers to discharge        Co-evaluation               AM-PAC PT "6 Clicks" Mobility  Outcome Measure Help needed turning from your back to your side while in a flat bed without using bedrails?: Total Help needed moving from lying on your back to sitting on the side of a flat bed without using bedrails?: Total Help needed moving to and from a bed to a chair (including a wheelchair)?: Total Help needed standing up from a chair using your arms (e.g., wheelchair or bedside chair)?: Total Help needed to walk in hospital room?: Total Help needed climbing 3-5 steps with a railing? : Total 6 Click Score: 6    End of Session Equipment Utilized During Treatment: Oxygen Activity Tolerance: Treatment limited secondary to medical complications (Comment) Patient left: in bed;with call bell/phone within reach Nurse Communication: Mobility status PT Visit Diagnosis: Muscle weakness (generalized) (M62.81)    Time: [id number] PT Time Calculation (min) (ACUTE ONLY): 15 min   Charges:   PT Evaluation $PT Eval High Complexity: 1 High          Zenaida Niece, PT, DPT Acute Rehabilitation Pager: [phone]   Zenaida Niece 03/10/2019, 10:27 AM

## 2019-03-10 NOTE — Progress Notes (Signed)
Patient ID: [name] [name], male   DOB: [birth year], 69 y.o.   MRN: [id number]   Advanced Heart Failure VAD Team Note  PCP-Cardiologist: No primary care provider on file.   Subjective:    Moved ti ICU yesterday for respiratory decompensation.   Remains on Bipap at 90%. PaO2 in high 50s. Attempted HFNC last night and sats dropped to 70s   Received IV lasix with only 500cc out  On VP to support BP    LVAD INTERROGATION:  HeartMate 3 LVAD:   Flow 3.9 iters/min, speed 5100, power 3.8  PI 5.8 No power spikes Objective:    Vital Signs:   Temp:  [97.3 F (36.3 C)-98.7 F (37.1 C)] 98.2 F (36.8 C) (01/08 0400) Pulse Rate:  [30-265] 137 (01/08 0700) Resp:  [16-38] 22 (01/08 0700) BP: (70-112)/(50-84) 70/50 (01/07 1410) SpO2:  [50 %-99 %] 77 % (01/08 0700) Arterial Line BP: (65-96)/(48-81) 93/72 (01/08 0700) FiO2 (%):  [70 %-100 %] 90 % (01/08 0400) Weight:  [110.5 kg] 110.5 kg (01/08 0500) Last BM Date: 03/07/19(per patient; RN did not witness) Mean arterial Pressure 70-80s  Intake/Output:   Intake/Output Summary (Last 24 hours) at 03/10/2019 0738 Last data filed at 03/10/2019 0700 Gross per 24 hour  Intake 427.79 ml  Output 1250 ml  Net -822.21 ml     Physical Exam    Physical Exam: General:  Tachypneic on BIPAP HEENT: normal  Neck: supple. JVP to jaw.  Carotids 2+ bilat; no bruits. No lymphadenopathy or thryomegaly appreciated. Cor: LVAD hum.  Lungs: Tachypneic. Low volumes Abdomen: obese soft, nontender, non-distended. No hepatosplenomegaly. No bruits or masses. Good bowel sounds. Driveline site clean. Anchor in place.  Extremities: no cyanosis, clubbing, rash. Warm 2+ edema  Neuro: alert & oriented x 3. No focal deficits. Moves all 4 without problem   Telemetry   AF 70-80s with PVCs Personally reviewed   Labs   Basic Metabolic Panel: Recent Labs  Lab 03/06/19 0232 03/07/19 0240 03/08/19 0304 03/09/19 0250 03/09/19 1354 03/09/19 1856 03/10/19 0015  03/10/19 0316 03/10/19 0510  NA 131* 134* 133* 134* 137 138 137 132* 135  K 4.5 5.5* 5.3* 4.8 4.8 5.0 5.1 5.1 5.2*  CL 92* 95* 96* 97*  --   --   --  95*  --   CO2 '29 30 27 28  ' --   --   --  21*  --   GLUCOSE 239* 203* 263* 103*  --   --   --  263*  --   BUN 55* 59* 71* 72*  --   --   --  80*  --   CREATININE 1.95* 1.86* 2.11* 1.83*  --   --   --  2.26*  --   CALCIUM 8.8* 8.8* 8.8* 8.5*  --   --   --  8.5*  --   MG 2.4 2.4 2.5* 2.4  --   --   --  2.6*  --     Liver Function Tests: Recent Labs  Lab 03/06/19 0232 03/07/19 0240 03/08/19 0304 03/09/19 0250 03/10/19 0316  AST 24 25 36 87* 121*  ALT 50* 43 44 73* 121*  ALKPHOS 120 105 104 94 94  BILITOT 0.8 1.1 1.3* 1.7* 2.5*  PROT 7.6 7.0 7.2 7.4 7.4  ALBUMIN 2.0* 2.0* 1.9* 2.0* 2.0*   No results for input(s): LIPASE, AMYLASE in the last 168 hours. Recent Labs  Lab 03/10/19 0316  AMMONIA 24    CBC: Recent Labs  Lab 03/06/19 0232 03/07/19 0240 03/08/19 0304 03/09/19 0250 03/09/19 1354 03/09/19 1856 03/10/19 0015 03/10/19 0316 03/10/19 0510  WBC 16.0* 13.5* 12.5* 15.8*  --   --   --  28.5*  --   HGB 12.5* 12.3* 12.3* 11.8* 14.3 13.9 14.3 12.0* 14.3  HCT 39.1 38.8* 38.2* 36.6* 42.0 41.0 42.0 37.6* 42.0  MCV 80.6 81.5 79.7* 80.8  --   --   --  80.0  --   PLT 326 307 283 283  --   --   --  236  --     INR: Recent Labs  Lab 03/06/19 0232 03/07/19 0240 03/08/19 0304 03/09/19 0250 03/10/19 0316  INR 3.0* 2.6* 2.8* 3.1* 4.7*    Other results:     Imaging   DG CHEST PORT 1 VIEW  Result Date: 03/09/2019 CLINICAL DATA:  Shortness of breath, history coronary artery disease, ischemic cardiomyopathy, hypertension, type II diabetes mellitus, chronic kidney disease, atrial flutter, former smoker EXAM: PORTABLE CHEST 1 VIEW COMPARISON:  Portable exam 0806 hours compared to 03/08/2018 and 1 at 0615 hours FINDINGS: LEFT subclavian AICD lead stable with leads projecting over RIGHT atrium, RIGHT ventricle, and coronary  sinus. Enlargement of cardiac silhouette post median sternotomy and LVAD. Diffuse infiltrates question pulmonary edema versus multifocal infection. Minimally improved aeration at RIGHT base versus previous study. No gross pleural effusion or pneumothorax. Generally low lung volumes noted. No acute osseous findings. IMPRESSION: Persistent diffuse BILATERAL pulmonary infiltrates, slightly improved at RIGHT base, question pulmonary edema versus multifocal infection. Electronically Signed   By: Lavonia Dana M.D.   On: 03/09/2019 08:27   DG Chest Port 1 View  Result Date: 03/09/2019 CLINICAL DATA:  Left ventricular assist device. EXAM: PORTABLE CHEST 1 VIEW COMPARISON:  03/07/2019.  CT 03/01/2019. FINDINGS: AICD in stable position. Left ventricular assist device in stable position. Stable cardiomegaly. Unchanged diffuse bilateral severe interstitial prominence. Low lung volumes. No pleural effusion or pneumothorax. IMPRESSION: 1. AICD in stable position. Left ventricular assist device in stable position. Stable cardiomegaly. 2. Diffuse bilateral severe interstitial prominence again noted without interim change. Reference is made to prior CT report of 03/01/2019. Low lung volumes. Electronically Signed   By: Marcello Moores  Register   On: 03/09/2019 07:27     Medications:     Scheduled Medications: . sodium chloride   Intravenous Once  . aspirin  81 mg Oral Daily  . Chlorhexidine Gluconate Cloth  6 each Topical Daily  . feeding supplement (PRO-STAT SUGAR FREE 64)  30 mL Oral TID WC  . gabapentin  600 mg Oral BID  . insulin aspart  0-15 Units Subcutaneous TID WC  . insulin aspart  0-5 Units Subcutaneous QHS  . insulin aspart protamine- aspart  15 Units Subcutaneous BID WC  . mouth rinse  15 mL Mouth Rinse BID  . methylPREDNISolone (SOLU-MEDROL) injection  60 mg Intravenous Q12H  . mexiletine  200 mg Oral Q12H  . pantoprazole  40 mg Oral Daily  . Warfarin - Pharmacist Dosing Inpatient   Does not apply q1800     Infusions: . vasopressin (PITRESSIN) infusion - *FOR SHOCK* 0.03 Units/min (03/10/19 0700)    PRN Medications: acetaminophen, ondansetron (ZOFRAN) IV   Assessment/Plan:    1. Acute hypoxemic respiratory failure: Recently seen in the ER and now admitted from clinic with marked hypoxemic requiring NRBM.  CXR with diffuse bilateral interstitial and airspace disease.  12/27 ESR elevated to 80 => 68 this admission with CRP 23.  PCT elevated mildly  0.67 => 0.7.  Respiratory virus and COVID-19 negative.  Blood cultures NGTD.  Afebrile, WBCs 15 on steroids.  Also noted to be volume overloaded on exam with significant JVD, weight gain, and peripheral edema. High resolution CT chest with diffuse GGO that could be consistent with amiodarone toxicity versus infection.  Autoimmune panel negative.  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  Amiodarone has been stopped and he has been started on steroids.  - Decompensated on 1/7 moved to ICU - Respiratory status now much worse with marked Aa gradient. CXR mildly improved if anything. Has not improved with diuresis. Requiring BiPAP at 90%. Reason for clinical worsening unclear. Prednisone switched back to solumedrol - Will check PCT, sputum cx and start empiric abx - Suspect he will need intubation soon. Will d/w CCM  2. 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  MDT CRT-D device.  LVAD parameters stable on interrogation.  - Has been diuresed aggressively this admission. Yesterday Creatinine trending up 1.86>2.3. Hold diuretics today - INR goal 2-2.5 with LVAD, continue ASA 81 and adjust warfarin.  - LDH higher at 475 at admission but with acute inflammatory disease, suspect pump thrombus formation less likely. Continues to range 470-480. LDH 493 today. No other evidence of pump thrombosis - Follow closely  - INR 3.1-> 4.7 today. Discussed dosing with PharmD personally. Recheck this  afternoon if > 5 will give 80m vit k   3. Atrial fibrillation permanent:  - rate controlled  4. 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 Frequent PVCs.  - On 1/4 started mexiletine 200 mg twice a day to suppress PVCs.   5. AKI on CKD stage 3:  - worse today. Hold diuretics  6. Diabetes: Blood glucose up with steroids.  - Continue SSI - Continue home 70/30.   7. Severe Malnutrition Albumin 1.9. Consult dietitian  Respiratory status worse over past 48 hours. Now BIPAP dependent. Suspect he is nearing need for intubation. Continue solumedrol.  Will check PCT and sputum cx. Start abx. Has not improved with diuresis. Will d/w CCM.   CRITICAL CARE Performed by: BGlori Bickers Total critical care time: 35 minutes  Critical care time was exclusive of separately billable procedures and treating other patients.  Critical care was necessary to treat or prevent imminent or life-threatening deterioration.  Critical care was time spent personally by me (independent of midlevel providers or residents) on the following activities: development of treatment plan with patient and/or surrogate as well as nursing, discussions with consultants, evaluation of patient's response to treatment, examination of patient, obtaining history from patient or surrogate, ordering and performing treatments and interventions, ordering and review of laboratory studies, ordering and review of radiographic studies, pulse oximetry and re-evaluation of patient's condition.   Length of Stay: 1[place] MD 03/10/2019, 7:38 AM  VAD Team --- VAD ISSUES ONLY--- Pager [phone](7am - 7am)  Advanced Heart Failure Team  Pager [phone](M-F; 7a - 4p)  Please contact [facility] for night-coverage after hours (4p -7a ) and weekends on [email or web address]

## 2019-03-11 ENCOUNTER — Inpatient Hospital Stay (HOSPITAL_COMMUNITY): Payer: PPO

## 2019-03-11 LAB — POCT I-STAT 7, (LYTES, BLD GAS, ICA,H+H)
Acid-Base Excess: 3 mmol/L — ABNORMAL HIGH (ref 0.0–2.0)
Bicarbonate: 27.1 mmol/L (ref 20.0–28.0)
Calcium, Ion: 1.2 mmol/L (ref 1.15–1.40)
HCT: 40 % (ref 39.0–52.0)
Hemoglobin: 13.6 g/dL (ref 13.0–17.0)
O2 Saturation: 84 %
Patient temperature: 97
Potassium: 4.6 mmol/L (ref 3.5–5.1)
Sodium: 140 mmol/L (ref 135–145)
TCO2: 28 mmol/L (ref 22–32)
pCO2 arterial: 37 mmHg (ref 32.0–48.0)
pH, Arterial: 7.468 — ABNORMAL HIGH (ref 7.350–7.450)
pO2, Arterial: 43 mmHg — ABNORMAL LOW (ref 83.0–108.0)

## 2019-03-11 LAB — GLUCOSE, CAPILLARY
Glucose-Capillary: 163 mg/dL — ABNORMAL HIGH (ref 70–99)
Glucose-Capillary: 181 mg/dL — ABNORMAL HIGH (ref 70–99)
Glucose-Capillary: 185 mg/dL — ABNORMAL HIGH (ref 70–99)
Glucose-Capillary: 191 mg/dL — ABNORMAL HIGH (ref 70–99)
Glucose-Capillary: 281 mg/dL — ABNORMAL HIGH (ref 70–99)
Glucose-Capillary: 286 mg/dL — ABNORMAL HIGH (ref 70–99)

## 2019-03-11 LAB — CBC WITH DIFFERENTIAL/PLATELET
Abs Immature Granulocytes: 0.22 10*3/uL — ABNORMAL HIGH (ref 0.00–0.07)
Basophils Absolute: 0 10*3/uL (ref 0.0–0.1)
Basophils Relative: 0 %
Eosinophils Absolute: 0 10*3/uL (ref 0.0–0.5)
Eosinophils Relative: 0 %
HCT: 37.5 % — ABNORMAL LOW (ref 39.0–52.0)
Hemoglobin: 12.3 g/dL — ABNORMAL LOW (ref 13.0–17.0)
Immature Granulocytes: 1 %
Lymphocytes Relative: 1 %
Lymphs Abs: 0.2 10*3/uL — ABNORMAL LOW (ref 0.7–4.0)
MCH: 26.2 pg (ref 26.0–34.0)
MCHC: 32.8 g/dL (ref 30.0–36.0)
MCV: 79.8 fL — ABNORMAL LOW (ref 80.0–100.0)
Monocytes Absolute: 1 10*3/uL (ref 0.1–1.0)
Monocytes Relative: 4 %
Neutro Abs: 24.9 10*3/uL — ABNORMAL HIGH (ref 1.7–7.7)
Neutrophils Relative %: 94 %
Platelets: 213 10*3/uL (ref 150–400)
RBC: 4.7 MIL/uL (ref 4.22–5.81)
RDW: 18.5 % — ABNORMAL HIGH (ref 11.5–15.5)
WBC: 26.4 10*3/uL — ABNORMAL HIGH (ref 4.0–10.5)
nRBC: 0.1 % (ref 0.0–0.2)

## 2019-03-11 LAB — COMPREHENSIVE METABOLIC PANEL
ALT: 127 U/L — ABNORMAL HIGH (ref 0–44)
AST: 76 U/L — ABNORMAL HIGH (ref 15–41)
Albumin: 1.9 g/dL — ABNORMAL LOW (ref 3.5–5.0)
Alkaline Phosphatase: 100 U/L (ref 38–126)
Anion gap: 13 (ref 5–15)
BUN: 89 mg/dL — ABNORMAL HIGH (ref 8–23)
CO2: 22 mmol/L (ref 22–32)
Calcium: 8.6 mg/dL — ABNORMAL LOW (ref 8.9–10.3)
Chloride: 103 mmol/L (ref 98–111)
Creatinine, Ser: 2.02 mg/dL — ABNORMAL HIGH (ref 0.61–1.24)
GFR calc Af Amer: 38 mL/min — ABNORMAL LOW (ref >60)
GFR calc non Af Amer: 33 mL/min — ABNORMAL LOW (ref >60)
Glucose, Bld: 204 mg/dL — ABNORMAL HIGH (ref 70–99)
Potassium: 4.7 mmol/L (ref 3.5–5.1)
Sodium: 138 mmol/L (ref 135–145)
Total Bilirubin: 2 mg/dL — ABNORMAL HIGH (ref 0.3–1.2)
Total Protein: 7.6 g/dL (ref 6.5–8.1)

## 2019-03-11 LAB — BRAIN NATRIURETIC PEPTIDE: B Natriuretic Peptide: 277.8 pg/mL — ABNORMAL HIGH (ref 0.0–100.0)

## 2019-03-11 LAB — BLOOD GAS, ARTERIAL
Acid-Base Excess: 1.2 mmol/L (ref 0.0–2.0)
Bicarbonate: 24.9 mmol/L (ref 20.0–28.0)
FIO2: 80
O2 Saturation: 89.1 %
Patient temperature: 37
pCO2 arterial: 37.3 mmHg (ref 32.0–48.0)
pH, Arterial: 7.44 (ref 7.350–7.450)
pO2, Arterial: 59.6 mmHg — ABNORMAL LOW (ref 83.0–108.0)

## 2019-03-11 LAB — PROTIME-INR
INR: 5.9 (ref 0.8–1.2)
Prothrombin Time: 53.2 seconds — ABNORMAL HIGH (ref 11.4–15.2)

## 2019-03-11 LAB — PHOSPHORUS: Phosphorus: 4.3 mg/dL (ref 2.5–4.6)

## 2019-03-11 LAB — ANTIEXTRACTABLE NUCLEAR AG
ENA SM Ab Ser-aCnc: <0.2 AI (ref 0.0–0.9)
Ribonucleic Protein: <0.2 AI (ref 0.0–0.9)

## 2019-03-11 LAB — LACTATE DEHYDROGENASE: LDH: 468 U/L — ABNORMAL HIGH (ref 98–192)

## 2019-03-11 LAB — PROCALCITONIN: Procalcitonin: 4.55 ng/mL

## 2019-03-11 LAB — MAGNESIUM: Magnesium: 2.9 mg/dL — ABNORMAL HIGH (ref 1.7–2.4)

## 2019-03-11 MED ORDER — PREDNISONE 20 MG PO TABS
60.0000 mg | ORAL_TABLET | Freq: Every day | ORAL | Status: DC
  Administered 2019-03-12: 60 mg via ORAL
  Filled 2019-03-11: qty 3

## 2019-03-11 MED ORDER — PHYTONADIONE 1 MG/0.5 ML ORAL SOLUTION
1.0000 mg | Freq: Once | ORAL | Status: AC
  Administered 2019-03-11: 1 mg via ORAL
  Filled 2019-03-11: qty 0.5

## 2019-03-11 MED ORDER — TORSEMIDE 20 MG PO TABS
20.0000 mg | ORAL_TABLET | Freq: Every day | ORAL | Status: DC
  Administered 2019-03-11: 20 mg via ORAL
  Filled 2019-03-11: qty 1

## 2019-03-11 NOTE — Progress Notes (Signed)
Patient has been stable and mentating well. On BiPap all night, ok to postpone ABG until another arterial line is placed; per Dr. Marletta Lor.

## 2019-03-11 NOTE — CV Procedure (Signed)
Radial arterial line placement.    The left wrist was prepped and draped in the routine sterile fashion a single lumen radial arterial catheter was placed in the left radial artery using a modified Seldinger technique. Good blood flow and wave forms. A dressing was placed.     Glori Bickers, MD  11:38 AM

## 2019-03-11 NOTE — Progress Notes (Signed)
ANTICOAGULATION CONSULT NOTE - [place] for warfarin Indication: atrial fibrillation/LVAD  Allergies  Allergen Reactions  . Penicillins Hives, Itching and Other (See Comments)    Did it involve swelling of the face/tongue/throat, SOB, or low BP? No Did it involve sudden or severe rash/hives, skin peeling, or any reaction on the inside of your mouth or nose? No Did you need to seek medical attention at a hospital or doctor's office? Unknown When did it last happen?childhood allergy If all above answers are "NO", may proceed with cephalosporin use.    Marland Kitchen Alcohol-Sulfur [Sulfur] Other (See Comments)    Burns skin    Patient Measurements: Height: 5\' 10"  (177.8 cm) Weight: 245 lb 9.5 oz (111.4 kg) IBW/kg (Calculated) : 73  Vital Signs: Temp: 97 F (36.1 C) (01/09 0700) Temp Source: Axillary (01/09 0700) Pulse Rate: 31 (01/09 0800)  Labs: Recent Labs    03/09/19 0250 03/10/19 0316 03/10/19 0510 03/10/19 1450 03/10/19 1634 03/11/19 0035  HGB 11.8* 12.0* 14.3  --  13.6 12.3*  HCT 36.6* 37.6* 42.0  --  40.0 37.5*  PLT 283 236  --   --   --  213  LABPROT 32.0* 44.6*  --  45.0*  --  53.2*  INR 3.1* 4.7*  --  4.8*  --  5.9*  CREATININE 1.83* 2.26*  --   --   --  2.02*    Estimated Creatinine Clearance: 43.8 mL/min (A) (by C-G formula based on SCr of 2.02 mg/dL (H)).   Medical History: Past Medical History:  Diagnosis Date  . AICD (automatic cardioverter/defibrillator) present 02/05/2014   Upgrade to Medtronic biventricular ICD, serial number  [id number]   . Atrial flutter ([place]) 04/2012   s/p TEE-EPS+RFCA 04/2012  . CAD (coronary artery disease) [id number] X 2    RCA-T, 70% PL (off CFX), 99% Prox LAD/90% Dist LAD, S/P TAXUS stent x 2  . CHF (congestive heart failure) ([place])   . Chronic anticoagulation   . Chronic systolic heart failure ([place])   . CKD (chronic kidney disease)   . Diabetic retinopathy ([place])   . DM type 2 (diabetes  mellitus, type 2) ([facility])    insulin dependent  . HTN (hypertension)   . Hypercholesteremia    ablation  . ICD (implantable cardiac defibrillator) in place   . Ischemic cardiomyopathy March 2015   20-25% 2D   . Nephrolithiasis   . 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   INR up to 5.9 - from 3.1 -eating very little while on BiPap  Hgb and pltc stable. No s/sx of bleeding  Now off amiodarone + steroids   *PTA Warfarin dose= 2.5mg  daily  Goal of Therapy:  INR 2.0-2.5 Monitor platelets by anticoagulation protocol: Yes   Plan:  -Hold warfarin until INR closer to 3  -Vit k 1mg  po x1  -Daily INR  Bonnita Nasuti Pharm.D. CPP, BCPS Clinical Pharmacist [phone] 03/11/2019 9:10 AM    Please check AMION for all [place] phone numbers After 10:00 PM, call [place] [phone] 03/11/2019       9:08 AM

## 2019-03-11 NOTE — Progress Notes (Signed)
Placed patient on heated high flow per order.

## 2019-03-11 NOTE — Progress Notes (Signed)
Notified Cards MD on call about INR of 5.9 this AM. No new orders have been received.   Will continue to monitor.   Cecilia Vancleve E Reola Mosher, [place]

## 2019-03-11 NOTE — Progress Notes (Addendum)
Patient ID: [name] [name], male   DOB: April 16, 1950, 69 y.o.   MRN: [id number]   Advanced Heart Failure VAD Team Note  PCP-Cardiologist: No primary care provider on file.   Subjective:    Remains on BIPAP. Appears fatigued. Denies SOB.   LVAD INTERROGATION:  HeartMate 3 LVAD:   Flow 3.2 iters/min, speed 5100, power 3.0 PI 5.0 No power spikes VAD interrogated personally. Parameters stable.  Objective:    Vital Signs:   Temp:  [97 F (36.1 C)-97.6 F (36.4 C)] 97 F (36.1 C) (01/09 0700) Pulse Rate:  [30-163] 31 (01/09 0800) Resp:  [13-31] 24 (01/09 0800) BP: (86)/(55) 86/55 (01/08 1117) SpO2:  [87 %-98 %] 88 % (01/09 0800) Arterial Line BP: (79-96)/(53-80) 79/53 (01/08 1900) FiO2 (%):  [90 %] 90 % (01/08 1117) Weight:  [111.4 kg] 111.4 kg (01/09 0500) Last BM Date: 03/07/19(per patient; RN did not witness) Mean arterial Pressure 70-80s on VP   Intake/Output:   Intake/Output Summary (Last 24 hours) at 03/11/2019 0848 Last data filed at 03/11/2019 0800 Gross per 24 hour  Intake 1771.52 ml  Output 700 ml  Net 1071.52 ml     Physical Exam    Physical Exam: General:  Sitting straight up in bed on BIPAP HEENT: normal + BIPAP mask  Neck: supple. JVP to jaw   Carotids 2+ bilat; no bruits. No lymphadenopathy or thryomegaly appreciated. Cor: LVAD hum.  Lungs: Diffuse mild crackles Abdomen: obese soft, nontender, non-distended. No hepatosplenomegaly. No bruits or masses. Good bowel sounds. Driveline site clean. Anchor in place.  Extremities: no cyanosis, clubbing, rash. Warm 2+ edema  Neuro: alert & oriented x 3. No focal deficits. Moves all 4 without problem    Telemetry   AF 60-70s with PVCs Personally reviewed   Labs   Basic Metabolic Panel: Recent Labs  Lab 03/07/19 0240 03/08/19 0304 03/09/19 0250 03/10/19 0015 03/10/19 0316 03/10/19 0510 03/10/19 1634 03/11/19 0035  NA 134* 133* 134* 137 132* 135 138 138  K 5.5* 5.3* 4.8 5.1 5.1 5.2* 4.8 4.7  CL 95*  96* 97*  --  95*  --   --  103  CO2 _0 --  21*  --   --  22  GLUCOSE 203* 263* 103*  --  263*  --   --  204*  BUN 59* 71* 72*  --  80*  --   --  89*  CREATININE 1.86* 2.11* 1.83*  --  2.26*  --   --  2.02*  CALCIUM 8.8* 8.8* 8.5*  --  8.5*  --   --  8.6*  MG 2.4 2.5* 2.4  --  2.6*  --   --  2.9*  PHOS  --   --   --   --   --   --   --  4.3    Liver Function Tests: Recent Labs  Lab 03/07/19 0240 03/08/19 0304 03/09/19 0250 03/10/19 0316 03/11/19 0035  AST 25 36 87* 121* 76*  ALT 43 44 73* 121* 127*  ALKPHOS 105 104 94 94 100  BILITOT 1.1 1.3* 1.7* 2.5* 2.0*  PROT 7.0 7.2 7.4 7.4 7.6  ALBUMIN 2.0* 1.9* 2.0* 2.0* 1.9*   No results for input(s): LIPASE, AMYLASE in the last 168 hours. Recent Labs  Lab 03/10/19 0316  AMMONIA 24    CBC: Recent Labs  Lab 03/07/19 0240 03/08/19 0304 03/09/19 0250 03/10/19 0015 03/10/19 0316 03/10/19 0510 03/10/19 1634 03/11/19 0035  WBC 13.5*  12.5* 15.8*  --  28.5*  --   --  26.4*  NEUTROABS  --   --   --   --   --   --   --  24.9*  HGB 12.3* 12.3* 11.8* 14.3 12.0* 14.3 13.6 12.3*  HCT 38.8* 38.2* 36.6* 42.0 37.6* 42.0 40.0 37.5*  MCV 81.5 79.7* 80.8  --  80.0  --   --  79.8*  PLT 307 283 283  --  236  --   --  213    INR: Recent Labs  Lab 03/08/19 0304 03/09/19 0250 03/10/19 0316 03/10/19 1450 03/11/19 0035  INR 2.8* 3.1* 4.7* 4.8* 5.9*    Other results:     Imaging   DG CHEST PORT 1 VIEW  Result Date: 03/10/2019 CLINICAL DATA:  Respiratory failure. EXAM: PORTABLE CHEST 1 VIEW COMPARISON:  03/09/2019.  CT 03/01/2019. FINDINGS: AICD and left ventricular assist device in stable position. Prior median sternotomy and cardiac valve replacement. Stable cardiomegaly. Progressive diffuse severe bilateral pulmonary infiltrates/edema. Low lung volumes. No pleural effusion or pneumothorax. IMPRESSION: 1. AICD and left ventricular assist device in stable position. Prior median sternotomy and cardiac valve replacement. Stable  cardiomegaly. 2. Progressive diffuse severe bilateral pulmonary infiltrates/edema. Low lung volumes. Electronically Signed   By: Marcello Moores  Register   On: 03/10/2019 08:56     Medications:     Scheduled Medications: . sodium chloride   Intravenous Once  . aspirin  81 mg Oral Daily  . Chlorhexidine Gluconate Cloth  6 each Topical Daily  . feeding supplement (PRO-STAT SUGAR FREE 64)  30 mL Oral TID WC  . gabapentin  600 mg Oral BID  . insulin aspart  0-15 Units Subcutaneous Q4H  . mouth rinse  15 mL Mouth Rinse BID  . methylPREDNISolone (SOLU-MEDROL) injection  60 mg Intravenous Q12H  . mexiletine  200 mg Oral Q12H  . pantoprazole  40 mg Oral Daily  . phytonadione  1 mg Oral Once  . Warfarin - Pharmacist Dosing Inpatient   Does not apply q1800    Infusions: . meropenem (MERREM) IV 2,000 mg (03/10/19 2239)  . [START ON 03/12/2019] vancomycin    . vasopressin (PITRESSIN) infusion - *FOR SHOCK* 0.03 Units/min (03/11/19 0800)    PRN Medications: acetaminophen, ondansetron (ZOFRAN) IV   Assessment/Plan:    1. Acute hypoxemic respiratory failure: Recently seen in the ER and now admitted from clinic with marked hypoxemic requiring NRBM.  CXR with diffuse bilateral interstitial and airspace disease.  12/27 ESR elevated to 80 => 68 this admission with CRP 23.  PCT elevated mildly 0.67 => 0.7.  Respiratory virus and COVID-19 negative.  Blood cultures NGTD.  Afebrile, WBCs 15 on steroids.  Also noted to be volume overloaded on exam with significant JVD, weight gain, and peripheral edema. High resolution CT chest with diffuse GGO that could be consistent with amiodarone toxicity versus infection.  Autoimmune panel negative.  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  Amiodarone has been stopped and he has been started on steroids.  - Decompensated on 1/7 moved to ICU - Respiratory status remains very tenuous. Currently BIPAP dependent.  On FiO2 80% On steroids.  - Initial PCT 0.7 now up to 5. Started van/meropenem for possible PNA on 1/8. CXR this am unchanged - Holding diuretics due to AKI. Weight up slightly. With VAD has more RV failure than LV so doubt this is worsening oxygenation. Will restart torsemide 46m daily  -  CCM following closely as he may be nearing intubation  - Will replace arterial line today to better follow ABGs and MAPs  2. 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  MDT CRT-D device.  LVAD parameters stable on interrogation.  - Has been diuresed aggressively this admission. Yesterday Creatinine trending up 1.86>2.3 > 2.02 Restarting torsemide due to RV failure - INR goal 2-2.5 with LVAD, continue ASA 81 and adjust warfarin.  - INR today 4.8 -> 5.9. Will give 3m vit K Discussed dosing with PharmD personally. - LDH higher at 475 at admission but with acute inflammatory disease, suspect pump thrombus formation less likely. Continues to range 470-480. LDH 493 -> 468 today. No other evidence of pump thrombosis - On VP for BP support  3. Atrial fibrillation permanent:  - rate controlled  4. 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 Frequent PVCs.  - On 1/4 started mexiletine 200 mg twice a day to suppress PVCs.   5. AKI on CKD stage 3:  - stable today  6. Diabetes: Blood glucose up with steroids.  - Continue SSI - Continue home 70/30.   7. Severe Malnutrition - Albumin 1.9. Consult dietitian  CRITICAL CARE Performed by: BGlori Bickers Total critical care time: 35 minutes  Critical care time was exclusive of separately billable procedures and treating other patients.  Critical care was necessary to treat or prevent imminent or life-threatening deterioration.  Critical care was time spent personally by me (independent of midlevel providers or residents) on the following activities: development of treatment plan with patient and/or surrogate as well as nursing, discussions with  consultants, evaluation of patient's response to treatment, examination of patient, obtaining history from patient or surrogate, ordering and performing treatments and interventions, ordering and review of laboratory studies, ordering and review of radiographic studies, pulse oximetry and re-evaluation of patient's condition.    Length of Stay: 158 DGlori Bickers MD 03/11/2019, 8:48 AM  VAD Team --- VAD ISSUES ONLY--- Pager [phone](7am - 7am)  Advanced Heart Failure Team  Pager [phone](M-F; 7a - 4p)  Please contact [facility] for night-coverage after hours (4p -7a ) and weekends on [email or web address]

## 2019-03-11 NOTE — Progress Notes (Signed)
PULMONARY / CRITICAL CARE MEDICINE   Name: BURK HOCTOR MRN: [id number] DOB: [birth year]    ADMISSION DATE:  02/27/2019 CONSULTATION DATE:  02/04/2019, 03/09/19  REFERRING MD:  Bensimhon  CHIEF COMPLAINT:  hypoxemia  HISTORY OF PRESENT ILLNESS:   34 yoM with extensive cardiac history with LVAD presenting with acute onset hypoxia possibly concerning for HF exacerbation vs amiodarone toxicity vs unknown process.  COVID negative x 3. 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   PAST MEDICAL HISTORY :  He  has a past medical history of AICD (automatic cardioverter/defibrillator) present (02/05/2014), Atrial flutter ([place]) (04/2012), CAD (coronary artery disease) (2992,4268 X 2 ), CHF (congestive heart failure) ([place]), Chronic anticoagulation, Chronic systolic heart failure ([place]), CKD (chronic kidney disease), Diabetic retinopathy ([place]), DM type 2 (diabetes mellitus, type 2) ([place]), HTN (hypertension), Hypercholesteremia, ICD (implantable cardiac defibrillator) in place, Ischemic cardiomyopathy (March 2015), Nephrolithiasis, and Ventricular tachycardia ([place]).  PAST SURGICAL HISTORY: He  has a past surgical history that includes Percutaneous coronary stent intervention (pci-s) (January 2002); Cardiac defibrillator placement (2007); Percutaneous coronary stent intervention (pci-s) (June 2002); Percutaneous coronary stent intervention (pci-s) (July 2006); Biv icd genertaor change out (02/05/2014); Atrial flutter ablation (N/A, 05/19/2012); bi-ventricular implantable cardioverter defibrillator upgrade (N/A, 02/05/2014); RIGHT/LEFT HEART CATH AND CORONARY ANGIOGRAPHY (N/A, 02/18/2017); RIGHT HEART CATH (N/A, 07/22/2017); IR US Guide Vasc Access Right (08/12/2017); IR Fluoro Guide CV Line Right (08/12/2017); RIGHT HEART CATH (N/A, 08/13/2017); Insertion of implantable left ventricular assist device (N/A, 08/16/2017); TEE without cardioversion (N/A, 08/16/2017);  Tricuspid valve replacement (N/A, 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  Allergies  Allergen Reactions  . Penicillins Hives, Itching and Other (See Comments)    Did it involve swelling of the face/tongue/throat, SOB, or low BP? No Did it involve sudden or severe rash/hives, skin peeling, or any reaction on the inside of your mouth or nose? No Did you need to seek medical attention at a hospital or doctor's office? Unknown When did it last happen?childhood allergy If all above answers are "NO", may proceed with cephalosporin use.    Marland Kitchen Alcohol-Sulfur [Sulfur] Other (See Comments)    Burns skin    No current facility-administered medications on file prior to encounter.   Current Outpatient Medications on File Prior to Encounter  Medication Sig  . acetaminophen (TYLENOL) 500 MG tablet Take 1,000 mg by mouth every 6 (six) hours as needed for moderate pain or headache.   Marland Kitchen ascorbic acid (VITAMIN C) 500 MG tablet Take 1 tablet (500 mg total) by mouth 2 (two) times daily.  Marland Kitchen aspirin 81 MG chewable tablet Chew 1 tablet (81 mg total) by mouth daily.  Marland Kitchen gabapentin (NEURONTIN) 300 MG capsule Take 2 capsules (600 mg total) by mouth 2 (two) times daily.  . insulin NPH-regular Human (70-30) 100 UNIT/ML injection Inject 40 Units into the skin daily.   Marland Kitchen losartan (COZAAR) 25 MG tablet Take 1 tablet (25 mg total) by mouth daily.  . magnesium oxide (MAG-OX) 400 (241.3 Mg) MG tablet Take 1 tablet (400 mg total) by mouth 2 (two) times daily.  . Multiple Vitamin (MULTIVITAMIN WITH MINERALS) TABS tablet Take 1 tablet by mouth daily.  . pantoprazole (PROTONIX) 40 MG tablet Take 1 tablet (40 mg total) by mouth daily.  . predniSONE (DELTASONE) 20 MG tablet Take 3 tablets (60 mg total) by mouth daily with breakfast for 7 days, THEN 2 tablets (  40 mg total) daily with breakfast.  .  torsemide (DEMADEX) 20 MG tablet Take 1 tablet (20 mg total) by mouth every Monday, Wednesday, and Friday.  . triamcinolone (NASACORT ALLERGY 24HR) 55 MCG/ACT AERO nasal inhaler Place 2 sprays into the nose daily.  Marland Kitchen warfarin (COUMADIN) 2.5 MG tablet Take on tablet daily or as directed (Patient taking differently: Take 2.5 mg by mouth daily. )  . amiodarone (PACERONE) 200 MG tablet Take 100-200 mg by mouth See admin instructions. Take 1 tablet (200 mg totally) by mouth in the morning; take 0.5 tablet (100 mg totally) in the evening  . Insulin Pen Needle (COMFORT EZ PEN NEEDLES) 32G X 6 MM MISC 1 application by Does not apply route at bedtime.    FAMILY HISTORY:  His He indicated that his mother is alive. He indicated that his father is deceased. He indicated that four of his five brothers are alive. He indicated that his maternal grandmother is deceased. He indicated that his maternal grandfather is deceased. He indicated that his paternal grandmother is deceased. He indicated that his paternal grandfather is deceased. He indicated that the status of his daughter is unknown. He indicated that the status of his son is unknown. He indicated that the status of his neg hx is unknown.   SOCIAL HISTORY: He  reports that he quit smoking about 19 years ago. His smoking use included cigarettes. He has a 29.00 pack-year smoking history. He has never used smokeless tobacco. He reports that he does not drink alcohol or use drugs.   ROS/SUBJECTIVE:  Hypoxemic this morning requiring bag mask then placed on bipap. Pt reports some shortness of breath with some improvement on bipap  VITAL SIGNS: BP (!) 86/55   Pulse 77   Temp (!) 97.5 F (36.4 C) (Axillary)   Resp (!) 21   Ht 5\' 10"  (1.778 m)   Wt 111.4 kg   SpO2 94%   BMI 35.24 kg/m   HEMODYNAMICS:    VENTILATOR SETTINGS: FiO2 (%):  [90 %] 90 %  INTAKE / OUTPUT: I/O last 3 completed shifts: In: 2012.2 [P.O.:240; I.V.:401.8; IV  Piggyback:1370.5] Out: 1200 [Urine:1200]  PHYSICAL EXAMINATION: General:  Uncomfortable appearing Neuro:  Alert and oriented, follows commands, answers questions appropriately, Cardiovascular:  lvad hum  Lungs:  Coarse sounds throughout Abdomen:  NTND Skin:  Warm, dry  LABS:  BMET Recent Labs  Lab 03/09/19 0250 03/10/19 0316 03/10/19 0510 03/10/19 1634 03/11/19 0035  NA 134* 132* 135 138 138  K 4.8 5.1 5.2* 4.8 4.7  CL 97* 95*  --   --  103  CO2 28 21*  --   --  22  BUN 72* 80*  --   --  89*  CREATININE 1.83* 2.26*  --   --  2.02*  GLUCOSE 103* 263*  --   --  204*    Electrolytes Recent Labs  Lab 03/09/19 0250 03/10/19 0316 03/11/19 0035  CALCIUM 8.5* 8.5* 8.6*  MG 2.4 2.6* 2.9*  PHOS  --   --  4.3    CBC Recent Labs  Lab 03/09/19 0250 03/10/19 0316 03/10/19 0510 03/10/19 1634 03/11/19 0035  WBC 15.8* 28.5*  --   --  26.4*  HGB 11.8* 12.0* 14.3 13.6 12.3*  HCT 36.6* 37.6* 42.0 40.0 37.5*  PLT 283 236  --   --  213    Coag's Recent Labs  Lab 03/10/19 0316 03/10/19 1450 03/11/19 0035  INR 4.7* 4.8* 5.9*    Sepsis  Markers Recent Labs  Lab 03/10/19 0743 03/10/19 1450 03/11/19 0035  PROCALCITON 4.91 5.15 4.55    ABG Recent Labs  Lab 03/10/19 0015 03/10/19 0510 03/10/19 1634  PHART 7.483* 7.447 7.451*  PCO2ART 33.1 34.6 39.3  PO2ART 40.0* 56.0* 62.0*    Liver Enzymes Recent Labs  Lab 03/09/19 0250 03/10/19 0316 03/11/19 0035  AST 87* 121* 76*  ALT 73* 121* 127*  ALKPHOS 94 94 100  BILITOT 1.7* 2.5* 2.0*  ALBUMIN 2.0* 2.0* 1.9*    Cardiac Enzymes No results for input(s): TROPONINI, PROBNP in the last 168 hours.  Glucose Recent Labs  Lab 03/10/19 1120 03/10/19 1543 03/10/19 2000 03/10/19 2326 03/11/19 0329 03/11/19 0741  GLUCAP 290* 280* 246* 203* 163* 181*    Imaging DG CHEST PORT 1 VIEW  Result Date: 03/10/2019 CLINICAL DATA:  Respiratory failure. EXAM: PORTABLE CHEST 1 VIEW COMPARISON:  03/09/2019.  CT  03/01/2019. FINDINGS: AICD and left ventricular assist device in stable position. Prior median sternotomy and cardiac valve replacement. Stable cardiomegaly. Progressive diffuse severe bilateral pulmonary infiltrates/edema. Low lung volumes. No pleural effusion or pneumothorax. IMPRESSION: 1. AICD and left ventricular assist device in stable position. Prior median sternotomy and cardiac valve replacement. Stable cardiomegaly. 2. Progressive diffuse severe bilateral pulmonary infiltrates/edema. Low lung volumes. 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 Initially some component of concomitant infection possible but workup negative. Increased O2 requirements, intermittently requiring bipap, No CO2 retention, On prednisone 60mg  with initial plan for around one week at this dose followed by 40 for one week then decrease by 10mg  weekly.  Chest radiograph stable P:   -transitioned to humidified High flow [place], optiflow and will repeat ABG in 2 hours -IS education given and use encouraged -reordered PT to help get him out of bed and moving -switch back to oral prednisone 60 mg as pt off bipap -volume status optimization per cardiology -he is covered with abx in case concomitant infection  CARDIOVASCULAR A:  Acute on chronic HFrEF: cr stable, on MWF torsemide 20mg , off bp meds, LVAD functioning appropriately, volume status difficult to assess.   P:  -cardiology following and is optimizine him from a cardiac standpoint  RENAL A:   AKI: baseline cr around 1.4, 2.02 today diuretic held yesterday P:   -continue to monitor  FAMILY  - Updates:   -  Inter-disciplinary family meet or Palliative Care meeting due by:  day 7  Pulmonary and [place] Pager: [phone] Vickki Muff MD PGY-3 Internal Medicine Pager # [phone]   03/11/2019, 7:45 AM

## 2019-03-12 DIAGNOSIS — J9602 Acute respiratory failure with hypercapnia: Secondary | ICD-10-CM

## 2019-03-12 DIAGNOSIS — N179 Acute kidney failure, unspecified: Secondary | ICD-10-CM

## 2019-03-12 LAB — POCT I-STAT 7, (LYTES, BLD GAS, ICA,H+H)
Acid-Base Excess: 1 mmol/L (ref 0.0–2.0)
Acid-Base Excess: 3 mmol/L — ABNORMAL HIGH (ref 0.0–2.0)
Acid-base deficit: 2 mmol/L (ref 0.0–2.0)
Bicarbonate: 24.5 mmol/L (ref 20.0–28.0)
Bicarbonate: 22.8 mmol/L (ref 20.0–28.0)
Bicarbonate: 25.8 mmol/L (ref 20.0–28.0)
Bicarbonate: 28.8 mmol/L — ABNORMAL HIGH (ref 20.0–28.0)
Calcium, Ion: 1.18 mmol/L (ref 1.15–1.40)
Calcium, Ion: 1.19 mmol/L (ref 1.15–1.40)
Calcium, Ion: 1.21 mmol/L (ref 1.15–1.40)
Calcium, Ion: 1.21 mmol/L (ref 1.15–1.40)
HCT: 42 % (ref 39.0–52.0)
HCT: 39 % (ref 39.0–52.0)
HCT: 39 % (ref 39.0–52.0)
HCT: 39 % (ref 39.0–52.0)
Hemoglobin: 14.3 g/dL (ref 13.0–17.0)
Hemoglobin: 13.3 g/dL (ref 13.0–17.0)
Hemoglobin: 13.3 g/dL (ref 13.0–17.0)
Hemoglobin: 13.3 g/dL (ref 13.0–17.0)
O2 Saturation: 86 %
O2 Saturation: 83 %
O2 Saturation: 89 %
O2 Saturation: 85 %
Patient temperature: 97.5
Patient temperature: 97.4
Patient temperature: 97.4
Potassium: 4.8 mmol/L (ref 3.5–5.1)
Potassium: 4.6 mmol/L (ref 3.5–5.1)
Potassium: 4.3 mmol/L (ref 3.5–5.1)
Potassium: 4.6 mmol/L (ref 3.5–5.1)
Sodium: 138 mmol/L (ref 135–145)
Sodium: 137 mmol/L (ref 135–145)
Sodium: 139 mmol/L (ref 135–145)
Sodium: 140 mmol/L (ref 135–145)
TCO2: 26 mmol/L (ref 22–32)
TCO2: 24 mmol/L (ref 22–32)
TCO2: 27 mmol/L (ref 22–32)
TCO2: 30 mmol/L (ref 22–32)
pCO2 arterial: 36.2 mmHg (ref 32.0–48.0)
pCO2 arterial: 39 mmHg (ref 32.0–48.0)
pCO2 arterial: 44.9 mmHg (ref 32.0–48.0)
pCO2 arterial: 48.4 mmHg — ABNORMAL HIGH (ref 32.0–48.0)
pH, Arterial: 7.439 (ref 7.350–7.450)
pH, Arterial: 7.372 (ref 7.350–7.450)
pH, Arterial: 7.364 (ref 7.350–7.450)
pH, Arterial: 7.381 (ref 7.350–7.450)
pO2, Arterial: 49 mmHg — ABNORMAL LOW (ref 83.0–108.0)
pO2, Arterial: 48 mmHg — ABNORMAL LOW (ref 83.0–108.0)
pO2, Arterial: 56 mmHg — ABNORMAL LOW (ref 83.0–108.0)
pO2, Arterial: 49 mmHg — ABNORMAL LOW (ref 83.0–108.0)

## 2019-03-12 LAB — COMPREHENSIVE METABOLIC PANEL
ALT: 132 U/L — ABNORMAL HIGH (ref 0–44)
AST: 104 U/L — ABNORMAL HIGH (ref 15–41)
Albumin: 1.8 g/dL — ABNORMAL LOW (ref 3.5–5.0)
Alkaline Phosphatase: 93 U/L (ref 38–126)
Anion gap: 13 (ref 5–15)
BUN: 107 mg/dL — ABNORMAL HIGH (ref 8–23)
CO2: 21 mmol/L — ABNORMAL LOW (ref 22–32)
Calcium: 8.7 mg/dL — ABNORMAL LOW (ref 8.9–10.3)
Chloride: 101 mmol/L (ref 98–111)
Creatinine, Ser: 2.42 mg/dL — ABNORMAL HIGH (ref 0.61–1.24)
GFR calc Af Amer: 31 mL/min — ABNORMAL LOW (ref >60)
GFR calc non Af Amer: 26 mL/min — ABNORMAL LOW (ref >60)
Glucose, Bld: 269 mg/dL — ABNORMAL HIGH (ref 70–99)
Potassium: 4.8 mmol/L (ref 3.5–5.1)
Sodium: 135 mmol/L (ref 135–145)
Total Bilirubin: 1.9 mg/dL — ABNORMAL HIGH (ref 0.3–1.2)
Total Protein: 7.1 g/dL (ref 6.5–8.1)

## 2019-03-12 LAB — PROTIME-INR
INR: 8.1 (ref 0.8–1.2)
Prothrombin Time: 67.8 seconds — ABNORMAL HIGH (ref 11.4–15.2)

## 2019-03-12 LAB — MAGNESIUM: Magnesium: 2.9 mg/dL — ABNORMAL HIGH (ref 1.7–2.4)

## 2019-03-12 LAB — GLUCOSE, CAPILLARY
Glucose-Capillary: 194 mg/dL — ABNORMAL HIGH (ref 70–99)
Glucose-Capillary: 212 mg/dL — ABNORMAL HIGH (ref 70–99)
Glucose-Capillary: 226 mg/dL — ABNORMAL HIGH (ref 70–99)
Glucose-Capillary: 248 mg/dL — ABNORMAL HIGH (ref 70–99)
Glucose-Capillary: 257 mg/dL — ABNORMAL HIGH (ref 70–99)
Glucose-Capillary: 257 mg/dL — ABNORMAL HIGH (ref 70–99)
Glucose-Capillary: 287 mg/dL — ABNORMAL HIGH (ref 70–99)

## 2019-03-12 LAB — PROCALCITONIN: Procalcitonin: 3.81 ng/mL

## 2019-03-12 LAB — TSH: TSH: 3.601 u[IU]/mL (ref 0.350–4.500)

## 2019-03-12 LAB — LACTATE DEHYDROGENASE: LDH: 555 U/L — ABNORMAL HIGH (ref 98–192)

## 2019-03-12 MED ORDER — SODIUM CHLORIDE 0.9 % IV SOLN
INTRAVENOUS | Status: DC
  Administered 2019-03-12: 50 mL/h via INTRAVENOUS

## 2019-03-12 MED ORDER — SODIUM CHLORIDE 0.9 % IV BOLUS
500.0000 mL | Freq: Once | INTRAVENOUS | Status: AC
  Administered 2019-03-12: 500 mL via INTRAVENOUS

## 2019-03-12 MED ORDER — SODIUM CHLORIDE 0.9% IV SOLUTION: Freq: Once | INTRAVENOUS | Status: AC

## 2019-03-12 MED ORDER — PHYTONADIONE 5 MG PO TABS
2.5000 mg | ORAL_TABLET | Freq: Once | ORAL | Status: AC
  Administered 2019-03-12: 2.5 mg via ORAL
  Filled 2019-03-12: qty 1

## 2019-03-12 NOTE — Progress Notes (Signed)
ANTICOAGULATION CONSULT NOTE - [place] for warfarin Indication: atrial fibrillation/LVAD  Allergies  Allergen Reactions  . Penicillins Hives, Itching and Other (See Comments)    Did it involve swelling of the face/tongue/throat, SOB, or low BP? No Did it involve sudden or severe rash/hives, skin peeling, or any reaction on the inside of your mouth or nose? No Did you need to seek medical attention at a hospital or doctor's office? Unknown When did it last happen?childhood allergy If all above answers are "NO", may proceed with cephalosporin use.    Marland Kitchen Alcohol-Sulfur [Sulfur] Other (See Comments)    Burns skin    Patient Measurements: Height: 5\' 10"  (177.8 cm) Weight: 249 lb 5.4 oz (113.1 kg) IBW/kg (Calculated) : 73  Vital Signs: Temp: 97.4 F (36.3 C) (01/10 0724) Temp Source: Oral (01/10 0724) BP: 88/70 (01/10 0800) Pulse Rate: 109 (01/10 0300)  Labs: Recent Labs    03/10/19 0316 03/10/19 1450 03/11/19 0035 03/11/19 1644 03/12/19 0500  HGB 12.0*  --  12.3* 13.6 14.3  HCT 37.6*  --  37.5* 40.0 42.0  PLT 236  --  213  --   --   LABPROT 44.6* 45.0* 53.2*  --  67.8*  INR 4.7* 4.8* 5.9*  --  8.1*  CREATININE 2.26*  --  2.02*  --  2.42*    Estimated Creatinine Clearance: 36.8 mL/min (A) (by C-G formula based on SCr of 2.42 mg/dL (H)).   Medical History: Past Medical History:  Diagnosis Date  . AICD (automatic cardioverter/defibrillator) present 02/05/2014   Upgrade to [employer] biventricular ICD, serial number  [id number]   . Atrial flutter ([place]) 04/2012   s/p TEE-EPS+RFCA 04/2012  . CAD (coronary artery disease) [id number] X 2    RCA-T, 70% PL (off CFX), 99% Prox LAD/90% Dist LAD, S/P TAXUS stent x 2  . CHF (congestive heart failure) ([place])   . Chronic anticoagulation   . Chronic systolic heart failure ([place])   . CKD (chronic kidney disease)   . Diabetic retinopathy ([place])   . DM type 2 (diabetes mellitus, type 2) ([facility])    insulin dependent  . HTN (hypertension)   . Hypercholesteremia    ablation  . ICD (implantable cardiac defibrillator) in place   . Ischemic cardiomyopathy March 2015   20-25% 2D   . Nephrolithiasis   . 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   INR up to 8 - from 3.1 -eating very little while on BiPap  Hgb and pltc stable. No s/sx of bleeding  Now off amiodarone + steroids  Received vitamin K 1mg  x1 1/10  *PTA Warfarin dose= 2.5mg  daily  Goal of Therapy:  INR 2.0-2.5 Monitor platelets by anticoagulation protocol: Yes   Plan:  -Hold warfarin until INR closer to 3  -Vit k 2.5mg  po x1 + FFP -Daily INR  Bonnita Nasuti Pharm.D. CPP, BCPS Clinical Pharmacist [phone] 03/12/2019 8:37 AM    Please check AMION for all [place] phone numbers After 10:00 PM, call Miguel Barrera [phone] 03/12/2019       8:37 AM

## 2019-03-12 NOTE — Progress Notes (Signed)
PULMONARY / CRITICAL CARE MEDICINE   Name: [name] [name] MRN: [id number] DOB: [birth year]    ADMISSION DATE:  02/27/2019 CONSULTATION DATE:  02/04/2019, 03/09/19  REFERRING MD:  Bensimhon  CHIEF COMPLAINT:  hypoxemia  HISTORY OF PRESENT ILLNESS:   34 yoM with extensive cardiac history with LVAD presenting with acute onset hypoxia possibly concerning for HF exacerbation vs amiodarone toxicity vs unknown process.  COVID negative x 3. 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   PAST MEDICAL HISTORY :  He  has a past medical history of AICD (automatic cardioverter/defibrillator) present (02/05/2014), Atrial flutter ([place]) (04/2012), CAD (coronary artery disease) (2992,4268 X 2 ), CHF (congestive heart failure) ([place]), Chronic anticoagulation, Chronic systolic heart failure ([place]), CKD (chronic kidney disease), Diabetic retinopathy ([place]), DM type 2 (diabetes mellitus, type 2) ([place]), HTN (hypertension), Hypercholesteremia, ICD (implantable cardiac defibrillator) in place, Ischemic cardiomyopathy (March 2015), Nephrolithiasis, and Ventricular tachycardia ([place]).  PAST SURGICAL HISTORY: He  has a past surgical history that includes Percutaneous coronary stent intervention (pci-s) (January 2002); Cardiac defibrillator placement (2007); Percutaneous coronary stent intervention (pci-s) (June 2002); Percutaneous coronary stent intervention (pci-s) (July 2006); Biv icd genertaor change out (02/05/2014); Atrial flutter ablation (N/A, 05/19/2012); bi-ventricular implantable cardioverter defibrillator upgrade (N/A, 02/05/2014); RIGHT/LEFT HEART CATH AND CORONARY ANGIOGRAPHY (N/A, 02/18/2017); RIGHT HEART CATH (N/A, 07/22/2017); IR US Guide Vasc Access Right (08/12/2017); IR Fluoro Guide CV Line Right (08/12/2017); RIGHT HEART CATH (N/A, 08/13/2017); Insertion of implantable left ventricular assist device (N/A, 08/16/2017); TEE without cardioversion (N/A, 08/16/2017);  Tricuspid valve replacement (N/A, 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  Allergies  Allergen Reactions  . Penicillins Hives, Itching and Other (See Comments)    Did it involve swelling of the face/tongue/throat, SOB, or low BP? No Did it involve sudden or severe rash/hives, skin peeling, or any reaction on the inside of your mouth or nose? No Did you need to seek medical attention at a hospital or doctor's office? Unknown When did it last happen?childhood allergy If all above answers are "NO", may proceed with cephalosporin use.    Marland Kitchen Alcohol-Sulfur [Sulfur] Other (See Comments)    Burns skin    No current facility-administered medications on file prior to encounter.   Current Outpatient Medications on File Prior to Encounter  Medication Sig  . acetaminophen (TYLENOL) 500 MG tablet Take 1,000 mg by mouth every 6 (six) hours as needed for moderate pain or headache.   Marland Kitchen ascorbic acid (VITAMIN C) 500 MG tablet Take 1 tablet (500 mg total) by mouth 2 (two) times daily.  Marland Kitchen aspirin 81 MG chewable tablet Chew 1 tablet (81 mg total) by mouth daily.  Marland Kitchen gabapentin (NEURONTIN) 300 MG capsule Take 2 capsules (600 mg total) by mouth 2 (two) times daily.  . insulin NPH-regular Human (70-30) 100 UNIT/ML injection Inject 40 Units into the skin daily.   Marland Kitchen losartan (COZAAR) 25 MG tablet Take 1 tablet (25 mg total) by mouth daily.  . magnesium oxide (MAG-OX) 400 (241.3 Mg) MG tablet Take 1 tablet (400 mg total) by mouth 2 (two) times daily.  . Multiple Vitamin (MULTIVITAMIN WITH MINERALS) TABS tablet Take 1 tablet by mouth daily.  . pantoprazole (PROTONIX) 40 MG tablet Take 1 tablet (40 mg total) by mouth daily.  . predniSONE (DELTASONE) 20 MG tablet Take 3 tablets (60 mg total) by mouth daily with breakfast for 7 days, THEN 2 tablets (  40 mg total) daily with breakfast.  .  torsemide (DEMADEX) 20 MG tablet Take 1 tablet (20 mg total) by mouth every Monday, Wednesday, and Friday.  . triamcinolone (NASACORT ALLERGY 24HR) 55 MCG/ACT AERO nasal inhaler Place 2 sprays into the nose daily.  Marland Kitchen warfarin (COUMADIN) 2.5 MG tablet Take on tablet daily or as directed (Patient taking differently: Take 2.5 mg by mouth daily. )  . amiodarone (PACERONE) 200 MG tablet Take 100-200 mg by mouth See admin instructions. Take 1 tablet (200 mg totally) by mouth in the morning; take 0.5 tablet (100 mg totally) in the evening  . Insulin Pen Needle (COMFORT EZ PEN NEEDLES) 32G X 6 MM MISC 1 application by Does not apply route at bedtime.    FAMILY HISTORY:  His He indicated that his mother is alive. He indicated that his father is deceased. He indicated that four of his five brothers are alive. He indicated that his maternal grandmother is deceased. He indicated that his maternal grandfather is deceased. He indicated that his paternal grandmother is deceased. He indicated that his paternal grandfather is deceased. He indicated that the status of his daughter is unknown. He indicated that the status of his son is unknown. He indicated that the status of his neg hx is unknown.   SOCIAL HISTORY: He  reports that he quit smoking about 19 years ago. His smoking use included cigarettes. He has a 29.00 pack-year smoking history. He has never used smokeless tobacco. He reports that he does not drink alcohol or use drugs.   ROS/SUBJECTIVE:  No acute events overnight, went around 24h no bipap, patient feels breathing is doing better  VITAL SIGNS: BP (!) 88/70 (BP Location: Left Arm)   Pulse (!) 109   Temp (!) 97.4 F (36.3 C) (Oral)   Resp 16   Ht 5\' 10"  (1.778 m)   Wt 113.1 kg   SpO2 (!) 86%   BMI 35.78 kg/m   HEMODYNAMICS:    VENTILATOR SETTINGS: FiO2 (%):  [100 %] 100 %  INTAKE / OUTPUT: I/O last 3 completed shifts: In: 2150.5 [P.O.:120; I.V.:539.9; IV Piggyback:1490.6] Out: 1200  [Urine:1200]  PHYSICAL EXAMINATION: General:  Uncomfortable appearing Neuro:  Alert and oriented, follows commands, answers questions appropriately Cardiovascular:  lvad hum  Lungs:  Coarse rales throughout Abdomen:  NTND Skin:  Warm, dry  LABS:  BMET Recent Labs  Lab 03/10/19 0316 03/11/19 0035 03/11/19 1644 03/12/19 0500  NA 132* 138 140 135  138  K 5.1 4.7 4.6 4.8  4.8  CL 95* 103  --  101  CO2 21* 22  --  21*  BUN 80* 89*  --  107*  CREATININE 2.26* 2.02*  --  2.42*  GLUCOSE 263* 204*  --  269*    Electrolytes Recent Labs  Lab 03/10/19 0316 03/11/19 0035 03/12/19 0500  CALCIUM 8.5* 8.6* 8.7*  MG 2.6* 2.9* 2.9*  PHOS  --  4.3  --     CBC Recent Labs  Lab 03/09/19 0250 03/10/19 0316 03/11/19 0035 03/11/19 1644 03/12/19 0500  WBC 15.8* 28.5* 26.4*  --   --   HGB 11.8* 12.0* 12.3* 13.6 14.3  HCT 36.6* 37.6* 37.5* 40.0 42.0  PLT 283 236 213  --   --     Coag's Recent Labs  Lab 03/10/19 1450 03/11/19 0035 03/12/19 0500  INR 4.8* 5.9* 8.1*    Sepsis Markers Recent Labs  Lab 03/10/19 1450 03/11/19 0035 03/12/19 0500  PROCALCITON 5.15 4.55 3.81  ABG Recent Labs  Lab 03/11/19 1057 03/11/19 1644 03/12/19 0500  PHART 7.440 7.468* 7.439  PCO2ART 37.3 37.0 36.2  PO2ART 59.6* 43.0* 49.0*    Liver Enzymes Recent Labs  Lab 03/10/19 0316 03/11/19 0035 03/12/19 0500  AST 121* 76* 104*  ALT 121* 127* 132*  ALKPHOS 94 100 93  BILITOT 2.5* 2.0* 1.9*  ALBUMIN 2.0* 1.9* 1.8*    Cardiac Enzymes No results for input(s): TROPONINI, PROBNP in the last 168 hours.  Glucose Recent Labs  Lab 03/11/19 1113 03/11/19 1536 03/11/19 1953 03/11/19 2342 03/12/19 0326 03/12/19 0722  GLUCAP 185* 191* 281* 286* 287* 226*    Imaging No results found.  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 Initially some component of concomitant infection possible but workup negative. Increased O2 requirements, intermittently requiring bipap, No CO2 retention, On prednisone 60mg  with initial plan for around one week at this dose followed by 40 for one week then decrease by 10mg  weekly.  Tolerating humidified HFNC last 24hrs.  Blood gas this am PO2 49 P:   -Humidified High flow [place] during the day, BIPAP QHS, monitor ABG -he is using his IS encouraged to continue -spoke with nurse about getting him out of bed today -continue prednisone 60 -he is covered with abx in case concomitant infection  CARDIOVASCULAR A:  Acute on chronic HFrEF: cr stable, on MWF torsemide 20mg , off bp meds, LVAD functioning appropriately, volume status difficult to assess.   P:  -cardiology following and is optimizine him from a cardiac standpoint they will add back fluids today due to AKI and low flow on LVAD suggestive of volume depletion  RENAL A:   AKI: baseline cr around 1.4, Cr up to 2.42 today P:   -IVF today  FAMILY   - Inter-disciplinary family meet or Palliative Care meeting due by:  day 7  [facility] [place] Pager: [phone] Vickki Muff MD PGY-3 Internal Medicine Pager # [phone]   03/12/2019, 9:09 AM

## 2019-03-12 NOTE — Progress Notes (Signed)
Pt stood up and transferred to [employer] with walker. Some SOB with increased work of breathing and respirations. Still alert and oriented x4. Put on Bipap and transferred back to bed. Obtained ABG's to confirm, as pulse-ox unable to pick up throughout shift (poor waveform, attempted multiple locations). Pt is in bed, mentating well and remains on Bipap.   Najai Waszak RN 03/11/2017 1900

## 2019-03-12 NOTE — Progress Notes (Signed)
Patient ID: [name] [name], male   DOB: [birth year], 69 y.o.   MRN: [id number]   Advanced Heart Failure VAD Team Note  PCP-Cardiologist: No primary care provider on file.   Subjective:    Off Bipap on HFNC. Feels breathing is better.   Flows have been running low. On VP. MAPs 70  LVAD INTERROGATION:  HeartMate 3 LVAD:   Flow 2.9 iters/min, speed 5100, power 3.3 PI 7.7 No power spikes VAD interrogated personally. Parameters stable.  Objective:    Vital Signs:   Temp:  [97.1 F (36.2 C)-98 F (36.7 C)] 97.4 F (36.3 C) (01/10 0724) Pulse Rate:  [30-217] 109 (01/10 0300) Resp:  [16-28] 20 (01/10 0800) BP: (70-108)/(53-81) 88/70 (01/10 0800) SpO2:  [76 %-94 %] 86 % (01/10 0409) Arterial Line BP: (70-90)/(49-72) 75/60 (01/10 0800) FiO2 (%):  [100 %] 100 % (01/10 0800) Weight:  [113.1 kg] 113.1 kg (01/10 0446) Last BM Date: 03/11/19 Mean arterial Pressure 70s on VP   Intake/Output:   Intake/Output Summary (Last 24 hours) at 03/12/2019 0810 Last data filed at 03/12/2019 0800 Gross per 24 hour  Intake 367.68 ml  Output 650 ml  Net -282.32 ml     Physical Exam    Physical Exam: General:  Sitting straight up in bed on HFNC General:  NAD.  HEENT: normal  Neck: supple. JVP 8.  Carotids 2+ bilat; no bruits. No lymphadenopathy or thryomegaly appreciated. Cor: LVAD hum.  Lungs: Mild crackles Abdomen: obese soft, nontender, non-distended. No hepatosplenomegaly. No bruits or masses. Good bowel sounds. Driveline site clean. Anchor in place.  Extremities: no cyanosis, clubbing, rash. Warm trace edema + TEDs Neuro: alert & oriented x 3. No focal deficits. Moves all 4 without problem    Telemetry   AF 60s with PVCs Personally reviewed   Labs   Basic Metabolic Panel: Recent Labs  Lab 03/08/19 0304 03/09/19 0250 03/10/19 0316 03/10/19 0510 03/10/19 1634 03/11/19 0035 03/11/19 1644 03/12/19 0500  NA 133* 134* 132* 135 138 138 140 135  138  K 5.3* 4.8 5.1 5.2* 4.8  4.7 4.6 4.8  4.8  CL 96* 97* 95*  --   --  103  --  101  CO2 27 28 21*  --   --  22  --  21*  GLUCOSE 263* 103* 263*  --   --  204*  --  269*  BUN 71* 72* 80*  --   --  89*  --  107*  CREATININE 2.11* 1.83* 2.26*  --   --  2.02*  --  2.42*  CALCIUM 8.8* 8.5* 8.5*  --   --  8.6*  --  8.7*  MG 2.5* 2.4 2.6*  --   --  2.9*  --  2.9*  PHOS  --   --   --   --   --  4.3  --   --     Liver Function Tests: Recent Labs  Lab 03/08/19 0304 03/09/19 0250 03/10/19 0316 03/11/19 0035 03/12/19 0500  AST 36 87* 121* 76* 104*  ALT 44 73* 121* 127* 132*  ALKPHOS 104 94 94 100 93  BILITOT 1.3* 1.7* 2.5* 2.0* 1.9*  PROT 7.2 7.4 7.4 7.6 7.1  ALBUMIN 1.9* 2.0* 2.0* 1.9* 1.8*   No results for input(s): LIPASE, AMYLASE in the last 168 hours. Recent Labs  Lab 03/10/19 0316  AMMONIA 24    CBC: Recent Labs  Lab 03/07/19 0240 03/08/19 0304 03/09/19 0250 03/10/19 0316 03/10/19 0510  03/10/19 1634 03/11/19 0035 03/11/19 1644 03/12/19 0500  WBC 13.5* 12.5* 15.8* 28.5*  --   --  26.4*  --   --   NEUTROABS  --   --   --   --   --   --  24.9*  --   --   HGB 12.3* 12.3* 11.8* 12.0* 14.3 13.6 12.3* 13.6 14.3  HCT 38.8* 38.2* 36.6* 37.6* 42.0 40.0 37.5* 40.0 42.0  MCV 81.5 79.7* 80.8 80.0  --   --  79.8*  --   --   PLT 307 283 283 236  --   --  213  --   --     INR: Recent Labs  Lab 03/09/19 0250 03/10/19 0316 03/10/19 1450 03/11/19 0035 03/12/19 0500  INR 3.1* 4.7* 4.8* 5.9* 8.1*    Other results:     Imaging   DG CHEST PORT 1 VIEW  Result Date: 03/11/2019 CLINICAL DATA:  Hypoxia EXAM: PORTABLE CHEST 1 VIEW COMPARISON:  March 10, 2019 FINDINGS: There is widespread airspace opacity. There is a small left pleural effusion. Status post left ventricular assist device. Pacemaker leads attached to right atrium, right ventricle, and coronary sinus. Stable cardiomegaly. Pulmonary vascularity within normal limits. No adenopathy. No bone lesions. IMPRESSION: Widespread airspace opacity  bilaterally. Question pneumonia versus pulmonary edema. Both entities may well be present concurrently. Appearance stable compared to 1 day prior. Stable cardiac prominence with postoperative changes. No adenopathy evident. Electronically Signed   By: Lowella Grip III M.D.   On: 03/11/2019 10:11   DG CHEST PORT 1 VIEW  Result Date: 03/10/2019 CLINICAL DATA:  Respiratory failure. EXAM: PORTABLE CHEST 1 VIEW COMPARISON:  03/09/2019.  CT 03/01/2019. FINDINGS: AICD and left ventricular assist device in stable position. Prior median sternotomy and cardiac valve replacement. Stable cardiomegaly. Progressive diffuse severe bilateral pulmonary infiltrates/edema. Low lung volumes. No pleural effusion or pneumothorax. IMPRESSION: 1. AICD and left ventricular assist device in stable position. Prior median sternotomy and cardiac valve replacement. Stable cardiomegaly. 2. Progressive diffuse severe bilateral pulmonary infiltrates/edema. Low lung volumes. Electronically Signed   By: Marcello Moores  Register   On: 03/10/2019 08:56     Medications:     Scheduled Medications: . sodium chloride   Intravenous Once  . aspirin  81 mg Oral Daily  . Chlorhexidine Gluconate Cloth  6 each Topical Daily  . feeding supplement (PRO-STAT SUGAR FREE 64)  30 mL Oral TID WC  . gabapentin  600 mg Oral BID  . insulin aspart  0-15 Units Subcutaneous Q4H  . mouth rinse  15 mL Mouth Rinse BID  . mexiletine  200 mg Oral Q12H  . pantoprazole  40 mg Oral Daily  . predniSONE  60 mg Oral Q breakfast  . torsemide  20 mg Oral Daily  . Warfarin - Pharmacist Dosing Inpatient   Does not apply q1800    Infusions: . sodium chloride    . meropenem (MERREM) IV Stopped (03/11/19 2304)  . vancomycin    . vasopressin (PITRESSIN) infusion - *FOR SHOCK* 0.03 Units/min (03/12/19 0600)    PRN Medications: acetaminophen, ondansetron (ZOFRAN) IV   Assessment/Plan:    1. Acute hypoxemic respiratory failure: Recently seen in the ER and now  admitted from clinic with marked hypoxemic requiring NRBM.  CXR with diffuse bilateral interstitial and airspace disease.  12/27 ESR elevated to 80 => 68 this admission with CRP 23.  PCT elevated mildly 0.67 => 0.7.  Respiratory virus and COVID-19 negative.  Blood cultures NGTD.  Afebrile, WBCs 15 on steroids.  Also noted to be volume overloaded on exam with significant JVD, weight gain, and peripheral edema. High resolution CT chest with diffuse GGO that could be consistent with amiodarone toxicity versus infection.  Autoimmune panel negative.  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  Amiodarone has been stopped and he has been started on steroids.  - Decompensated on 1/7 moved to ICU - Respiratory status remains very tenuous. Currently BIPAP dependent. On FiO2 80% On steroids.  - Initial PCT 0.7 now up to 5. Started van/meropenem for possible PNA on 1/8. CXR unchanged - Weaned from BIPAP to HFNC. Sats mid to high 80s% - Discussed with CCM at bedside. Will use HFNC during day and BIPAP at night.  - Follow closely for need for intubation - Continue serial ABGs  2. 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  MDT CRT-D device.  LVAD parameters stable on interrogation.  - Has been diuresed aggressively this admission. Yesterday Creatinine trending up 1.86>2.3 > 2.02 > 2.42  Flows low. Will give IVF today - INR goal 2-2.5 with LVAD, continue ASA 81 and adjust warfarin.  - INR today 4.8 -> 5.9 -> 8.1. - Got 24m vit K yesterday. Will give 1u FFP and 2.544mpo VIT k today - LDH higher at 475 at admission but with acute inflammatory disease, suspect pump thrombus formation less likely. Continues to range 470-480. LDH 493 -> 468 -> 555 today. No other evidence of pump thrombosis (no powers spikes or dark urine). Suspect due to underlying illness - On VP for BP support  3. Atrial fibrillation permanent:  - rate controlled. No  change  4. 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 Frequent PVCs.  - On 1/4 started mexiletine 200 mg twice a day to suppress PVCs.  - Will stop all non-essential meds from now as clinical deterioration seems multifactorial  5. AKI on CKD stage 3:  - creatinine up again today. Management as above.   6. Diabetes: Blood glucose up with steroids.  - Continue SSI - Continue home 70/30.   7. Severe Malnutrition - Albumin 1.8. Consulted dietitian - po intake poor  8. Transaminitis/liver failure - possibly due to amio.  - amio stopped   CRITICAL CARE Performed by: BeGlori BickersTotal critical care time: 35 minutes  Critical care time was exclusive of separately billable procedures and treating other patients.  Critical care was necessary to treat or prevent imminent or life-threatening deterioration.  Critical care was time spent personally by me (independent of midlevel providers or residents) on the following activities: development of treatment plan with patient and/or surrogate as well as nursing, discussions with consultants, evaluation of patient's response to treatment, examination of patient, obtaining history from patient or surrogate, ordering and performing treatments and interventions, ordering and review of laboratory studies, ordering and review of radiographic studies, pulse oximetry and re-evaluation of patient's condition.    Length of Stay: 12[place]addoMD 03/12/2019, 8:10 AM  VAD Team --- VAD ISSUES ONLY--- Pager [phone] - 7am)  Advanced Heart Failure Team  Pager [phone]-F; 7a - 4p)  Please contact [facility] for night-coverage after hours (4p -7a ) and weekends on [email or web address]

## 2019-03-13 ENCOUNTER — Inpatient Hospital Stay (HOSPITAL_COMMUNITY): Payer: PPO

## 2019-03-13 DIAGNOSIS — R0902 Hypoxemia: Secondary | ICD-10-CM

## 2019-03-13 LAB — POCT I-STAT 7, (LYTES, BLD GAS, ICA,H+H)
Acid-Base Excess: 1 mmol/L (ref 0.0–2.0)
Acid-Base Excess: 1 mmol/L (ref 0.0–2.0)
Acid-Base Excess: 3 mmol/L — ABNORMAL HIGH (ref 0.0–2.0)
Acid-base deficit: 1 mmol/L (ref 0.0–2.0)
Acid-base deficit: 1 mmol/L (ref 0.0–2.0)
Bicarbonate: 26.2 mmol/L (ref 20.0–28.0)
Bicarbonate: 25.9 mmol/L (ref 20.0–28.0)
Bicarbonate: 28.6 mmol/L — ABNORMAL HIGH (ref 20.0–28.0)
Bicarbonate: 27.2 mmol/L (ref 20.0–28.0)
Bicarbonate: 25.6 mmol/L (ref 20.0–28.0)
Calcium, Ion: 1.21 mmol/L (ref 1.15–1.40)
Calcium, Ion: 1.24 mmol/L (ref 1.15–1.40)
Calcium, Ion: 1.21 mmol/L (ref 1.15–1.40)
Calcium, Ion: 1.23 mmol/L (ref 1.15–1.40)
Calcium, Ion: 1.19 mmol/L (ref 1.15–1.40)
HCT: 40 % (ref 39.0–52.0)
HCT: 40 % (ref 39.0–52.0)
HCT: 40 % (ref 39.0–52.0)
HCT: 41 % (ref 39.0–52.0)
HCT: 42 % (ref 39.0–52.0)
Hemoglobin: 13.6 g/dL (ref 13.0–17.0)
Hemoglobin: 13.6 g/dL (ref 13.0–17.0)
Hemoglobin: 13.6 g/dL (ref 13.0–17.0)
Hemoglobin: 13.9 g/dL (ref 13.0–17.0)
Hemoglobin: 14.3 g/dL (ref 13.0–17.0)
O2 Saturation: 88 %
O2 Saturation: 74 %
O2 Saturation: 87 %
O2 Saturation: 91 %
O2 Saturation: 93 %
Patient temperature: 97.6
Patient temperature: 94.5
Patient temperature: 95.2
Patient temperature: 97.9
Patient temperature: 97.1
Potassium: 4.3 mmol/L (ref 3.5–5.1)
Potassium: 4.4 mmol/L (ref 3.5–5.1)
Potassium: 4.7 mmol/L (ref 3.5–5.1)
Potassium: 5.1 mmol/L (ref 3.5–5.1)
Potassium: 5.2 mmol/L — ABNORMAL HIGH (ref 3.5–5.1)
Sodium: 141 mmol/L (ref 135–145)
Sodium: 141 mmol/L (ref 135–145)
Sodium: 141 mmol/L (ref 135–145)
Sodium: 141 mmol/L (ref 135–145)
Sodium: 141 mmol/L (ref 135–145)
TCO2: 28 mmol/L (ref 22–32)
TCO2: 27 mmol/L (ref 22–32)
TCO2: 30 mmol/L (ref 22–32)
TCO2: 29 mmol/L (ref 22–32)
TCO2: 27 mmol/L (ref 22–32)
pCO2 arterial: 42.3 mmHg (ref 32.0–48.0)
pCO2 arterial: 38.7 mmHg (ref 32.0–48.0)
pCO2 arterial: 41.8 mmHg (ref 32.0–48.0)
pCO2 arterial: 56.2 mmHg — ABNORMAL HIGH (ref 32.0–48.0)
pCO2 arterial: 47.1 mmHg (ref 32.0–48.0)
pH, Arterial: 7.398 (ref 7.350–7.450)
pH, Arterial: 7.423 (ref 7.350–7.450)
pH, Arterial: 7.436 (ref 7.350–7.450)
pH, Arterial: 7.291 — ABNORMAL LOW (ref 7.350–7.450)
pH, Arterial: 7.34 — ABNORMAL LOW (ref 7.350–7.450)
pO2, Arterial: 54 mmHg — ABNORMAL LOW (ref 83.0–108.0)
pO2, Arterial: 34 mmHg — CL (ref 83.0–108.0)
pO2, Arterial: 47 mmHg — ABNORMAL LOW (ref 83.0–108.0)
pO2, Arterial: 69 mmHg — ABNORMAL LOW (ref 83.0–108.0)
pO2, Arterial: 69 mmHg — ABNORMAL LOW (ref 83.0–108.0)

## 2019-03-13 LAB — CBC
HCT: 37.5 % — ABNORMAL LOW (ref 39.0–52.0)
Hemoglobin: 11.9 g/dL — ABNORMAL LOW (ref 13.0–17.0)
MCH: 25.7 pg — ABNORMAL LOW (ref 26.0–34.0)
MCHC: 31.7 g/dL (ref 30.0–36.0)
MCV: 81 fL (ref 80.0–100.0)
Platelets: 108 10*3/uL — ABNORMAL LOW (ref 150–400)
RBC: 4.63 MIL/uL (ref 4.22–5.81)
RDW: 19 % — ABNORMAL HIGH (ref 11.5–15.5)
WBC: 17.9 10*3/uL — ABNORMAL HIGH (ref 4.0–10.5)
nRBC: 0.2 % (ref 0.0–0.2)

## 2019-03-13 LAB — BPAM FFP
Blood Product Expiration Date: 202101142359
ISSUE DATE / TIME: 202101100955
Unit Type and Rh: 6200

## 2019-03-13 LAB — GLUCOSE, CAPILLARY
Glucose-Capillary: 153 mg/dL — ABNORMAL HIGH (ref 70–99)
Glucose-Capillary: 164 mg/dL — ABNORMAL HIGH (ref 70–99)
Glucose-Capillary: 165 mg/dL — ABNORMAL HIGH (ref 70–99)
Glucose-Capillary: 171 mg/dL — ABNORMAL HIGH (ref 70–99)
Glucose-Capillary: 177 mg/dL — ABNORMAL HIGH (ref 70–99)
Glucose-Capillary: 188 mg/dL — ABNORMAL HIGH (ref 70–99)

## 2019-03-13 LAB — MAGNESIUM: Magnesium: 3 mg/dL — ABNORMAL HIGH (ref 1.7–2.4)

## 2019-03-13 LAB — PROTIME-INR
INR: 6.7 (ref 0.8–1.2)
Prothrombin Time: 58.4 seconds — ABNORMAL HIGH (ref 11.4–15.2)

## 2019-03-13 LAB — COMPREHENSIVE METABOLIC PANEL
ALT: 161 U/L — ABNORMAL HIGH (ref 0–44)
AST: 141 U/L — ABNORMAL HIGH (ref 15–41)
Albumin: 2 g/dL — ABNORMAL LOW (ref 3.5–5.0)
Alkaline Phosphatase: 100 U/L (ref 38–126)
Anion gap: 13 (ref 5–15)
BUN: 107 mg/dL — ABNORMAL HIGH (ref 8–23)
CO2: 24 mmol/L (ref 22–32)
Calcium: 8.8 mg/dL — ABNORMAL LOW (ref 8.9–10.3)
Chloride: 103 mmol/L (ref 98–111)
Creatinine, Ser: 2.16 mg/dL — ABNORMAL HIGH (ref 0.61–1.24)
GFR calc Af Amer: 35 mL/min — ABNORMAL LOW (ref >60)
GFR calc non Af Amer: 30 mL/min — ABNORMAL LOW (ref >60)
Glucose, Bld: 195 mg/dL — ABNORMAL HIGH (ref 70–99)
Potassium: 4.4 mmol/L (ref 3.5–5.1)
Sodium: 140 mmol/L (ref 135–145)
Total Bilirubin: 1.6 mg/dL — ABNORMAL HIGH (ref 0.3–1.2)
Total Protein: 6.6 g/dL (ref 6.5–8.1)

## 2019-03-13 LAB — ANCA TITERS
Atypical P-ANCA titer: <1:20 {titer}
C-ANCA: <1:20 {titer}
P-ANCA: <1:20 {titer}

## 2019-03-13 LAB — PREPARE FRESH FROZEN PLASMA: Unit division: 0

## 2019-03-13 LAB — GLOMERULAR BASEMENT MEMBRANE ANTIBODIES: GBM Ab: 3 units (ref 0–20)

## 2019-03-13 LAB — LACTATE DEHYDROGENASE: LDH: 575 U/L — ABNORMAL HIGH (ref 98–192)

## 2019-03-13 MED ORDER — PANTOPRAZOLE SODIUM 40 MG PO PACK
40.0000 mg | PACK | Freq: Every day | ORAL | Status: DC
  Administered 2019-03-13 – 2019-03-17 (×5): 40 mg
  Filled 2019-03-13 (×5): qty 20

## 2019-03-13 MED ORDER — ASPIRIN 81 MG PO CHEW
81.0000 mg | CHEWABLE_TABLET | Freq: Every day | ORAL | Status: DC
  Administered 2019-03-14 – 2019-03-17 (×4): 81 mg
  Filled 2019-03-13 (×4): qty 1

## 2019-03-13 MED ORDER — SODIUM CHLORIDE 0.9 % IV SOLN
250.0000 mL | INTRAVENOUS | Status: DC | PRN
  Administered 2019-03-13 – 2019-03-15 (×2): 250 mL via INTRAVENOUS

## 2019-03-13 MED ORDER — FENTANYL CITRATE (PF) 100 MCG/2ML IJ SOLN
25.0000 ug | INTRAMUSCULAR | Status: DC | PRN
  Administered 2019-03-13 (×2): 25 ug via INTRAVENOUS
  Filled 2019-03-13 (×3): qty 2

## 2019-03-13 MED ORDER — PREDNISONE 5 MG/ML PO CONC
60.0000 mg | Freq: Every day | ORAL | Status: DC
  Filled 2019-03-13: qty 12

## 2019-03-13 MED ORDER — MIDAZOLAM HCL 2 MG/2ML IJ SOLN
INTRAMUSCULAR | Status: AC
  Administered 2019-03-13: 4 mg via INTRAVENOUS
  Filled 2019-03-13: qty 4

## 2019-03-13 MED ORDER — MIDAZOLAM HCL 2 MG/2ML IJ SOLN
1.0000 mg | INTRAMUSCULAR | Status: DC | PRN
  Administered 2019-03-13 – 2019-03-14 (×3): 1 mg via INTRAVENOUS
  Filled 2019-03-13 (×4): qty 2

## 2019-03-13 MED ORDER — ORAL CARE MOUTH RINSE
15.0000 mL | OROMUCOSAL | Status: DC
  Administered 2019-03-14 – 2019-03-17 (×33): 15 mL via OROMUCOSAL

## 2019-03-13 MED ORDER — ACETAMINOPHEN 325 MG PO TABS
650.0000 mg | ORAL_TABLET | ORAL | Status: DC | PRN
  Administered 2019-03-15 – 2019-03-17 (×2): 650 mg
  Filled 2019-03-13 (×3): qty 2

## 2019-03-13 MED ORDER — FENTANYL CITRATE (PF) 100 MCG/2ML IJ SOLN
INTRAMUSCULAR | Status: AC
  Administered 2019-03-13: 100 ug
  Filled 2019-03-13: qty 2

## 2019-03-13 MED ORDER — SODIUM CHLORIDE 0.9% FLUSH: 10.0000 mL | INTRAVENOUS | Status: DC | PRN

## 2019-03-13 MED ORDER — SODIUM CHLORIDE 0.9% FLUSH
3.0000 mL | Freq: Two times a day (BID) | INTRAVENOUS | Status: DC
  Administered 2019-03-16 – 2019-03-17 (×2): 3 mL via INTRAVENOUS

## 2019-03-13 MED ORDER — SODIUM CHLORIDE 0.9% FLUSH
10.0000 mL | Freq: Two times a day (BID) | INTRAVENOUS | Status: DC
  Administered 2019-03-13: 20 mL
  Administered 2019-03-14: 10 mL

## 2019-03-13 MED ORDER — GABAPENTIN 300 MG PO CAPS
600.0000 mg | ORAL_CAPSULE | Freq: Two times a day (BID) | ORAL | Status: DC
  Administered 2019-03-13 – 2019-03-14 (×2): 600 mg
  Filled 2019-03-13 (×2): qty 2

## 2019-03-13 MED ORDER — SODIUM CHLORIDE 0.9% FLUSH: 3.0000 mL | INTRAVENOUS | Status: DC | PRN

## 2019-03-13 MED ORDER — NOREPINEPHRINE 4 MG/250ML-% IV SOLN
INTRAVENOUS | Status: AC
  Filled 2019-03-13: qty 250

## 2019-03-13 MED ORDER — SODIUM CHLORIDE 0.9 % IV SOLN: INTRAVENOUS | Status: DC

## 2019-03-13 MED ORDER — PRO-STAT SUGAR FREE PO LIQD
30.0000 mL | Freq: Three times a day (TID) | ORAL | Status: DC
  Administered 2019-03-13: 30 mL
  Filled 2019-03-13: qty 30

## 2019-03-13 MED ORDER — FENTANYL CITRATE (PF) 100 MCG/2ML IJ SOLN
25.0000 ug | INTRAMUSCULAR | Status: DC | PRN
  Administered 2019-03-13 – 2019-03-14 (×4): 25 ug via INTRAVENOUS
  Administered 2019-03-14: 50 ug via INTRAVENOUS
  Filled 2019-03-13 (×4): qty 2

## 2019-03-13 MED ORDER — MIDAZOLAM HCL 2 MG/2ML IJ SOLN
1.0000 mg | INTRAMUSCULAR | Status: AC | PRN
  Administered 2019-03-13 – 2019-03-14 (×3): 1 mg via INTRAVENOUS
  Filled 2019-03-13 (×3): qty 2

## 2019-03-13 MED ORDER — NOREPINEPHRINE 4 MG/250ML-% IV SOLN
2.0000 ug/min | INTRAVENOUS | Status: DC
  Administered 2019-03-13: 2 ug/min via INTRAVENOUS

## 2019-03-13 MED ORDER — CHLORHEXIDINE GLUCONATE 0.12% ORAL RINSE (MEDLINE KIT)
15.0000 mL | Freq: Two times a day (BID) | OROMUCOSAL | Status: DC
  Administered 2019-03-13 – 2019-03-17 (×8): 15 mL via OROMUCOSAL

## 2019-03-13 MED ORDER — ASPIRIN 81 MG PO CHEW: 81.0000 mg | CHEWABLE_TABLET | ORAL | Status: DC

## 2019-03-13 NOTE — Progress Notes (Signed)
PT Cancellation Note  Patient Details Name: [name] [name] MRN: [id number] DOB: 10-May-1950   Cancelled Treatment:    Reason Eval/Treat Not Completed: Medical issues which prohibited therapy Pt with poor respiratory status despite being on BiPAP. For potential intubation. Holding PT at this time. Will follow.   Marguarite Arbour A Lane Kjos 03/13/2019, 9:34 AM Marisa Severin, PT, DPT Acute Rehabilitation Services Pager [phone] Office [phone]

## 2019-03-13 NOTE — Progress Notes (Signed)
LVAD Coordinator Rounding Note:  Admitted 02/24/2019 per Dr. 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  HM III LVAD implanted on 08/16/17 by Dr. Prescott Gum  under Destination Therapy criteria.  Pt sedated and intubated. Plan for RHC this afternoon.      Vital signs: Temp: 97.3 HR: 71 Doppler Pressure: not done Art BP: 80/58 (72) O2 Sat: 92% on 100% FiO2 on vent Wt: 236.5>238.7>248.9>248.9>247>244.9>251>254>243.6>249.3 lbs   LVAD interrogation reveals:  Speed: 5100 Flow: 3.8 Power:  3.4w PI: 3.3 Alarms: none Events:  1 PI event today 58 PI events yesterday Hematocrit: 20 - do not adjust  Fixed speed: 5100  Low speed limit: 4800   Drive Line:  Dressing C/D/I; anchor intact and accurately applied. Weekly dressing changes per [facility] nurse; next dressing change due 1/11//21.  Labs:  LDH trend:  475>352>347>360>328>365>396>485>493>575  INR trend: 2.9>3.1>3.4>3.2>3.1>3.0>2.6>3.1>4.7>6.7  WBC trend: 10.0>12.8>15.2>12.7>14.2>16.0>13.5>15.8>28.5>17.9  Cultures: 02/12/2019 - respiratory panel>>negative final 02/03/2019 - blood cxs>negative/final   Anticoagulation Plan: -INR Goal: 2.0 - 2.5 -ASA Dose: 81 mg daily  Device:Medtronic 3 Lead Therapies: ON:  VF 200 bpm, FVT 200 - 240; VT 158 - 200 - ATP Therapies: AT/AF monitor on 171 bpm Pacing: DDD 60 Last check: 11/10/18  Plan/Recommendations:  1. Page VAD coordinator with any VAD equipment or drive line issues. 2. Weekly dressing changes per BS nurse; next change due 03/13/19.  Emerson Monte RN [place] Coordinator  Office: [phone]  24/7 Pager: [phone]

## 2019-03-13 NOTE — Progress Notes (Signed)
ANTICOAGULATION CONSULT NOTE - [place] for warfarin Indication: atrial fibrillation/LVAD  Allergies  Allergen Reactions  . Penicillins Hives, Itching and Other (See Comments)    Did it involve swelling of the face/tongue/throat, SOB, or low BP? No Did it involve sudden or severe rash/hives, skin peeling, or any reaction on the inside of your mouth or nose? No Did you need to seek medical attention at a hospital or doctor's office? Unknown When did it last happen?childhood allergy If all above answers are "NO", may proceed with cephalosporin use.    Marland Kitchen Alcohol-Sulfur [Sulfur] Other (See Comments)    Burns skin    Patient Measurements: Height: 5\' 10"  (177.8 cm) Weight: 249 lb 5.4 oz (113.1 kg) IBW/kg (Calculated) : 73  Vital Signs: Temp: 97.3 F (36.3 C) (01/11 0700) Temp Source: Axillary (01/11 0700) BP: 111/75 (01/11 0500) Pulse Rate: 30 (01/11 0500)  Labs: Recent Labs    03/11/19 0035 03/12/19 0500 03/13/19 0238 03/13/19 0356 03/13/19 0449 03/13/19 0454 03/13/19 0616  HGB 12.3* 14.3  --   --  13.6 11.9* 13.6  HCT 37.5* 42.0  --   --  40.0 37.5* 40.0  PLT 213  --   --   --   --  108*  --   LABPROT 53.2* 67.8* 58.4*  --   --   --   --   INR 5.9* 8.1* 6.7*  --   --   --   --   CREATININE 2.02* 2.42*  --  2.16*  --   --   --     Estimated Creatinine Clearance: 41.2 mL/min (A) (by C-G formula based on SCr of 2.16 mg/dL (H)).   Medical History: Past Medical History:  Diagnosis Date  . AICD (automatic cardioverter/defibrillator) present 02/05/2014   Upgrade to Medtronic biventricular ICD, serial number  [id number]   . Atrial flutter ([place]) 04/2012   s/p TEE-EPS+RFCA 04/2012  . CAD (coronary artery disease) [id number] X 2    RCA-T, 70% PL (off CFX), 99% Prox LAD/90% Dist LAD, S/P TAXUS stent x 2  . CHF (congestive heart failure) ([place])   . Chronic anticoagulation   . Chronic systolic heart failure ([place])   . CKD (chronic kidney  disease)   . Diabetic retinopathy ([place])   . DM type 2 (diabetes mellitus, type 2) ([facility])    insulin dependent  . HTN (hypertension)   . Hypercholesteremia    ablation  . ICD (implantable cardiac defibrillator) in place   . Ischemic cardiomyopathy March 2015   20-25% 2D   . Nephrolithiasis   . 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   INR up to 8 yesterday now down to 6.7 s/p FFP and vitamin K (last dose of warfarin 1/6). LDH elevated, unclear etiology, no evidence of pump failure. H/H stable, pltc down.   *PTA Warfarin dose= 2.5mg  daily  Goal of Therapy:  INR 2.0-2.5 Monitor platelets by anticoagulation protocol: Yes   Plan:  -Hold warfarin until INR closer to 3  -Daily INR   Arrie Senate, PharmD, BCPS Clinical Pharmacist [phone] Please check AMION for all [place] numbers 03/13/2019

## 2019-03-13 NOTE — Progress Notes (Addendum)
Patient ID: [name] [name], male   DOB: [birth year], 69 y.o.   MRN: [id number]   Advanced Heart Failure VAD Team Note  PCP-Cardiologist: No primary care provider on file.   Subjective:    Was on HFNC most of the day yesterday. Decompensated last night and back on Bipap.   Much more lethargic this am but still able to follow command.  Flows have been running low. On Vasopressin for BP support. Lasix has been held with low VAD flows and rising creatinine. Got IVFs yesterday. Now off.   7.43/42/47/87%  LVAD INTERROGATION:  HeartMate 3 LVAD:   Flow 3.3 liters/min, speed 5100, power 3.4 PI 7.7 Multiple PI events No power spikes VAD interrogated personally. Parameters stable.  Objective:    Vital Signs:   Temp:  [94.5 F (34.7 C)-98.4 F (36.9 C)] 95.2 F (35.1 C) (01/11 0600) Pulse Rate:  [29-92] 30 (01/11 0500) Resp:  [15-35] 19 (01/11 0500) BP: (82-119)/(64-103) 111/75 (01/11 0500) SpO2:  [77 %-89 %] 84 % (01/11 0500) Arterial Line BP: (75-107)/(50-82) 100/75 (01/11 0500) FiO2 (%):  [90 %-100 %] 90 % (01/11 0542) Last BM Date: 03/12/19 Mean arterial Pressure 70s on VP   Intake/Output:   Intake/Output Summary (Last 24 hours) at 03/13/2019 0747 Last data filed at 03/13/2019 0540 Gross per 24 hour  Intake 3202.67 ml  Output 1352 ml  Net 1850.67 ml     Physical Exam    Physical Exam: General: On bipap lethargic but can follow commands HEENT: normal  + bipap Neck: supple. JVP to jaw Carotids 2+ bilat; no bruits. No lymphadenopathy or thryomegaly appreciated. Cor: LVAD hum.  Lungs: Diffuse crackles Abdomen: obese soft, nontender, non-distended. No hepatosplenomegaly. No bruits or masses. Good bowel sounds. Driveline site clean. Anchor in place.  Extremities: no cyanosis, clubbing, rash. 2+ edema  Neuro:lethargic but can follow commands   Telemetry   AF 40-50s with PVCs Personally reviewed   Labs   Basic Metabolic Panel: Recent Labs  Lab 03/09/19 0250  03/10/19 0316 03/11/19 0035 03/12/19 0500 03/12/19 2322 03/13/19 0228 03/13/19 0238 03/13/19 0356 03/13/19 0449 03/13/19 0616  NA 134* 132* 138 135  138 140 141  --  140 141 141  K 4.8 5.1 4.7 4.8  4.8 4.6 4.3  --  4.4 4.4 4.7  CL 97* 95* 103 101  --   --   --  103  --   --   CO2 28 21* 22 21*  --   --   --  24  --   --   GLUCOSE 103* 263* 204* 269*  --   --   --  195*  --   --   BUN 72* 80* 89* 107*  --   --   --  107*  --   --   CREATININE 1.83* 2.26* 2.02* 2.42*  --   --   --  2.16*  --   --   CALCIUM 8.5* 8.5* 8.6* 8.7*  --   --   --  8.8*  --   --   MG 2.4 2.6* 2.9* 2.9*  --   --  3.0*  --   --   --   PHOS  --   --  4.3  --   --   --   --   --   --   --     Liver Function Tests: Recent Labs  Lab 03/09/19 0250 03/10/19 0316 03/11/19 0035 03/12/19 0500 03/13/19 0356  AST  87* 121* 76* 104* 141*  ALT 73* 121* 127* 132* 161*  ALKPHOS 94 94 100 93 100  BILITOT 1.7* 2.5* 2.0* 1.9* 1.6*  PROT 7.4 7.4 7.6 7.1 6.6  ALBUMIN 2.0* 2.0* 1.9* 1.8* 2.0*   No results for input(s): LIPASE, AMYLASE in the last 168 hours. Recent Labs  Lab 03/10/19 0316  AMMONIA 24    CBC: Recent Labs  Lab 03/08/19 0304 03/09/19 0250 03/10/19 0316 03/11/19 0035 03/12/19 2322 03/13/19 0228 03/13/19 0449 03/13/19 0454 03/13/19 0616  WBC 12.5* 15.8* 28.5* 26.4*  --   --   --  17.9*  --   NEUTROABS  --   --   --  24.9*  --   --   --   --   --   HGB 12.3* 11.8* 12.0* 12.3* 13.3 13.6 13.6 11.9* 13.6  HCT 38.2* 36.6* 37.6* 37.5* 39.0 40.0 40.0 37.5* 40.0  MCV 79.7* 80.8 80.0 79.8*  --   --   --  81.0  --   PLT 283 283 236 213  --   --   --  108*  --     INR: Recent Labs  Lab 03/10/19 0316 03/10/19 1450 03/11/19 0035 03/12/19 0500 03/13/19 0238  INR 4.7* 4.8* 5.9* 8.1* 6.7*    Other results:     Imaging   DG CHEST PORT 1 VIEW  Result Date: 03/13/2019 CLINICAL DATA:  Respiratory distress EXAM: PORTABLE CHEST 1 VIEW COMPARISON:  March 11, 2019 FINDINGS: The patient is  status post left ventricular assist device. A left-sided pacemaker is again noted. Slight interval worsening in the hazy/interstitial airspace opacities throughout both lungs. The cardiomediastinal silhouette is unchanged. IMPRESSION: Slight interval worsening in the hazy/interstitial airspace opacities. Electronically Signed   By: Prudencio Pair M.D.   On: 03/13/2019 06:30   DG CHEST PORT 1 VIEW  Result Date: 03/11/2019 CLINICAL DATA:  Hypoxia EXAM: PORTABLE CHEST 1 VIEW COMPARISON:  March 10, 2019 FINDINGS: There is widespread airspace opacity. There is a small left pleural effusion. Status post left ventricular assist device. Pacemaker leads attached to right atrium, right ventricle, and coronary sinus. Stable cardiomegaly. Pulmonary vascularity within normal limits. No adenopathy. No bone lesions. IMPRESSION: Widespread airspace opacity bilaterally. Question pneumonia versus pulmonary edema. Both entities may well be present concurrently. Appearance stable compared to 1 day prior. Stable cardiac prominence with postoperative changes. No adenopathy evident. Electronically Signed   By: Lowella Grip III M.D.   On: 03/11/2019 10:11     Medications:     Scheduled Medications: . sodium chloride   Intravenous Once  . aspirin  81 mg Oral Daily  . Chlorhexidine Gluconate Cloth  6 each Topical Daily  . feeding supplement (PRO-STAT SUGAR FREE 64)  30 mL Oral TID WC  . gabapentin  600 mg Oral BID  . insulin aspart  0-15 Units Subcutaneous Q4H  . mouth rinse  15 mL Mouth Rinse BID  . pantoprazole  40 mg Oral Daily  . predniSONE  60 mg Oral Q breakfast  . Warfarin - Pharmacist Dosing Inpatient   Does not apply q1800    Infusions: . meropenem (MERREM) IV Stopped (03/12/19 2250)  . vancomycin Stopped (03/12/19 1127)  . vasopressin (PITRESSIN) infusion - *FOR SHOCK* 0.03 Units/min (03/13/19 0500)    PRN Medications: acetaminophen, ondansetron (ZOFRAN) IV   Assessment/Plan:    1. Acute  hypoxemic respiratory failure: Recently seen in the ER and now admitted from clinic with marked hypoxemic requiring NRBM.  CXR  with diffuse bilateral interstitial and airspace disease.  12/27 ESR elevated to 80 => 68 this admission with CRP 23.  PCT elevated mildly 0.67 => 0.7.  Respiratory virus and COVID-19 negative.  Blood cultures NGTD.  Afebrile, WBCs 15 on steroids.  Also noted to be volume overloaded on exam with significant JVD, weight gain, and peripheral edema. High resolution CT chest with diffuse GGO that could be consistent with amiodarone toxicity versus infection.  Autoimmune panel negative.  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  Amiodarone has been stopped and he has been started on steroids.  - Decompensated on 1/7 moved to ICU - Respiratory status remains very tenuous. Currently BIPAP dependent. Suspect he will need to be intubated today. Will d/w CCM - Initial PCT 0.7 now up to 5. Started van/meropenem for possible PNA on 1/8. CXR unchanged - He does have volume overload on exam but mostly RV failure. Do not think we can get much more from diuresis as left sided pressures typically decongested well with VAD. Marland Kitchen Can place swan as needed   - Continue serial ABGs  2. 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  MDT CRT-D device.  LVAD parameters stable on interrogation.  - Has been diuresed aggressively this admission. Yesterday Creatinine trending up. Gave IVF and improved.  1.86>2.3 > 2.02 > 2.42 > 2.16. IVF now off - INR goal 2-2.5 with LVAD, continue ASA 81 and adjust warfarin.  - INR today 4.8 -> 5.9 -> 8.1.-> 6.7 - Yesterday  Will give 1u FFP and 2.72m po VIT k  - LDH higher at 475 at admission but with acute inflammatory disease, suspect pump thrombus formation less likely. Continues to range 470-480. LDH 493 -> 468 -> 555 -> 575 today. No other evidence of pump thrombosis (no powers spikes or dark urine).  Suspect due to underlying illness - On VP for BP support  3. Atrial fibrillation permanent:  - rate controlled. No change  4. 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 Frequent PVCs.  - On 1/4 started mexiletine 200 mg twice a day to suppress PVCs.  - Will stop all non-essential meds from now as clinical deterioration seems multifactorial  5. AKI on CKD stage 3:  - creatinine improved slightly with IVF - BUN > 100. Suspect he is uremic as well.  - ? Need for CVVHD   6. Diabetes: Blood glucose up with steroids.  - Continue SSI - Continue home 70/30.   7. Severe Malnutrition - Albumin 1.8. Consulted dietitian - po intake poor  8. Transaminitis/liver failure - possibly due to amio.  - amio stopped - check ammonia   Worse today with MSOF. Worsening respiratory status. Suspect he will need intubation +/- CVVHD. Will d/w Dr. YNelda Marseille   CRITICAL CARE Performed by: BGlori Bickers Total critical care time: 45 minutes  Critical care time was exclusive of separately billable procedures and treating other patients.  Critical care was necessary to treat or prevent imminent or life-threatening deterioration.  Critical care was time spent personally by me (independent of midlevel providers or residents) on the following activities: development of treatment plan with patient and/or surrogate as well as nursing, discussions with consultants, evaluation of patient's response to treatment, examination of patient, obtaining history from patient or surrogate, ordering and performing treatments and interventions, ordering and review of laboratory studies, ordering and review of radiographic studies, pulse oximetry and re-evaluation of patient's condition.    Length  of Stay: 18  Glori Bickers, MD 03/13/2019, 7:47 AM  VAD Team --- VAD ISSUES ONLY--- Pager [phone] (7am - 7am)  Advanced Heart Failure Team  Pager [phone] (M-F; 7a - 4p)  Please contact [facility] for  night-coverage after hours (4p -7a ) and weekends on [email or web address]

## 2019-03-13 NOTE — Telephone Encounter (Signed)
Still admitted

## 2019-03-13 NOTE — Progress Notes (Signed)
Chaplain engaged in follow-up visit with [name] [name] and his wife.  Chaplain offered continued support.  Chaplain will continue to follow-up.

## 2019-03-13 NOTE — Progress Notes (Signed)
Driveline dressing changed, RN observed wife who has been checked off to change dressing under sterile technique.  Some induration noted where driveline was resting.  Bensimohn MD assessed and driveline adjusted to not rest in same position.  Per MD will show to clinic staff tomorrow.  Will continue to monitor.

## 2019-03-13 NOTE — Progress Notes (Signed)
[facility] Progress Note Patient Name: [name] [name] DOB: [birth year] MRN: [id number]   Date of Service  03/13/2019  HPI/Events of Note  ABG: 7.42/38/34/26 on HFNC. Prior to this had been on BiPAP with FiO2 0.6 during the night with PaO2 values in mid 50s.  Patient was transitioned to BiPAP prior to me assessing him on HFNC.  SpO2 does not reliably provide a reading.  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 He is being treated for this with steroids.  He is currently receiving IVF (NS @ 50cc/hr) and is +1.9L positive for fluid balance for past 24 hours.   eICU Interventions  Increase FiO2 on BiPAP to 0.7. Repeat ABG in 30 mins to ensure PaO2 ~60 as SpO2 is not reliable.  I have discontinued the mIVF. Even if volume overload is not the major/primary cause of his hypoxemic respiratory failure (I.e. ILD may well be driving most of the hypoxemia) we should still endeavor to maintain a euvolemic fluid balance as interstitial/pulmonary edema will only worsen his prognosis.  May benefit from Lasix though I will defer to cardiology team on this.  I have ordered a CXR since this was last performed on 1/9.     Intervention Category Major Interventions: Hypoxemia - evaluation and management;Respiratory failure - evaluation and management  Charlott Rakes 03/13/2019, 5:16 AM

## 2019-03-13 NOTE — Progress Notes (Signed)
Pharmacy Antibiotic Note  [name] [name] is a 69 y.o. male admitted on 02/08/2019 with pneumonia.  Pharmacy has been consulted for vancomycin and meropenem dosing.  Cr stable, pt afebrile, WBC count down (on steroids).  Plan: -Vancomycin 1750mg  IV q48h -Meropenem 2g IV every 12 hours -Monitor renal fx, clinical pic, cx results, and vanc levels as appropriate.  Height: 5\' 10"  (177.8 cm) Weight: 249 lb 5.4 oz (113.1 kg) IBW/kg (Calculated) : 73  Temp (24hrs), Avg:96.9 F (36.1 C), Min:94.5 F (34.7 C), Max:98.4 F (36.9 C)  Recent Labs  Lab 03/08/19 0304 03/09/19 0250 03/10/19 0316 03/11/19 0035 03/12/19 0500 03/13/19 0356 03/13/19 0454  WBC 12.5* 15.8* 28.5* 26.4*  --   --  17.9*  CREATININE 2.11* 1.83* 2.26* 2.02* 2.42* 2.16*  --     Estimated Creatinine Clearance: 41.2 mL/min (A) (by C-G formula based on SCr of 2.16 mg/dL (H)).    Allergies  Allergen Reactions  . Penicillins Hives, Itching and Other (See Comments)    Did it involve swelling of the face/tongue/throat, SOB, or low BP? No Did it involve sudden or severe rash/hives, skin peeling, or any reaction on the inside of your mouth or nose? No Did you need to seek medical attention at a hospital or doctor's office? Unknown When did it last happen?childhood allergy If all above answers are "NO", may proceed with cephalosporin use.    Marland Kitchen Alcohol-Sulfur [Sulfur] Other (See Comments)    Burns skin    Antimicrobials this admission: Vancomycin 1/8 >>  Meropenem 1/8 >>   Dose adjustments this admission: N/A  Microbiology results: 12/29 BCx: NGTD 12/29 Resp PCR: neg  1/8 Sputum: ordered  1/8 MRSA PCR: neg  Thank you for allowing pharmacy to be a part of this patient's care.  Arrie Senate, PharmD, BCPS Clinical Pharmacist [phone] Please check AMION for all [place] numbers 03/13/2019

## 2019-03-13 NOTE — Procedures (Signed)
Intubation Procedure Note AVARD JANSSEN [id number] 02/22/1951  Procedure: Intubation Indications: Respiratory insufficiency  Procedure Details Consent: Risks of procedure as well as the alternatives and risks of each were explained to the (patient/caregiver).  Consent for procedure obtained. Time Out: Verified patient identification, verified procedure, site/side was marked, verified correct patient position, special equipment/implants available, medications/allergies/relevent history reviewed, required imaging and test results available.  Performed  Maximum sterile technique was used including gloves, gown, hand hygiene and mask Glidescope, 1 attempt  Evaluation Hemodynamic Status: BP stable throughout; O2 sats: transiently fell during during procedure Patient's Current Condition: stable Complications: No apparent complications Patient did tolerate procedure well. Chest X-ray ordered to verify placement.  CXR: pending.   Sherlene Rickel 03/13/2019

## 2019-03-13 NOTE — Progress Notes (Signed)
PULMONARY / CRITICAL CARE MEDICINE   Name: [name] [name] MRN: [id number] DOB: [birth year]    ADMISSION DATE:  02/27/2019 CONSULTATION DATE:  02/04/2019, 03/09/19  REFERRING MD:  Bensimhon  CHIEF COMPLAINT:  hypoxemia  HISTORY OF PRESENT ILLNESS:   34 yoM with extensive cardiac history with LVAD presenting with acute onset hypoxia possibly concerning for HF exacerbation vs amiodarone toxicity vs unknown process.  COVID negative x 3. 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   PAST MEDICAL HISTORY :  He  has a past medical history of AICD (automatic cardioverter/defibrillator) present (02/05/2014), Atrial flutter ([place]) (04/2012), CAD (coronary artery disease) (2992,4268 X 2 ), CHF (congestive heart failure) ([place]), Chronic anticoagulation, Chronic systolic heart failure ([place]), CKD (chronic kidney disease), Diabetic retinopathy ([place]), DM type 2 (diabetes mellitus, type 2) ([place]), HTN (hypertension), Hypercholesteremia, ICD (implantable cardiac defibrillator) in place, Ischemic cardiomyopathy (March 2015), Nephrolithiasis, and Ventricular tachycardia ([place]).  PAST SURGICAL HISTORY: He  has a past surgical history that includes Percutaneous coronary stent intervention (pci-s) (January 2002); Cardiac defibrillator placement (2007); Percutaneous coronary stent intervention (pci-s) (June 2002); Percutaneous coronary stent intervention (pci-s) (July 2006); Biv icd genertaor change out (02/05/2014); Atrial flutter ablation (N/A, 05/19/2012); bi-ventricular implantable cardioverter defibrillator upgrade (N/A, 02/05/2014); RIGHT/LEFT HEART CATH AND CORONARY ANGIOGRAPHY (N/A, 02/18/2017); RIGHT HEART CATH (N/A, 07/22/2017); IR US Guide Vasc Access Right (08/12/2017); IR Fluoro Guide CV Line Right (08/12/2017); RIGHT HEART CATH (N/A, 08/13/2017); Insertion of implantable left ventricular assist device (N/A, 08/16/2017); TEE without cardioversion (N/A, 08/16/2017);  Tricuspid valve replacement (N/A, 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  Allergies  Allergen Reactions  . Penicillins Hives, Itching and Other (See Comments)    Did it involve swelling of the face/tongue/throat, SOB, or low BP? No Did it involve sudden or severe rash/hives, skin peeling, or any reaction on the inside of your mouth or nose? No Did you need to seek medical attention at a hospital or doctor's office? Unknown When did it last happen?childhood allergy If all above answers are "NO", may proceed with cephalosporin use.    Marland Kitchen Alcohol-Sulfur [Sulfur] Other (See Comments)    Burns skin    No current facility-administered medications on file prior to encounter.   Current Outpatient Medications on File Prior to Encounter  Medication Sig  . acetaminophen (TYLENOL) 500 MG tablet Take 1,000 mg by mouth every 6 (six) hours as needed for moderate pain or headache.   Marland Kitchen ascorbic acid (VITAMIN C) 500 MG tablet Take 1 tablet (500 mg total) by mouth 2 (two) times daily.  Marland Kitchen aspirin 81 MG chewable tablet Chew 1 tablet (81 mg total) by mouth daily.  Marland Kitchen gabapentin (NEURONTIN) 300 MG capsule Take 2 capsules (600 mg total) by mouth 2 (two) times daily.  . insulin NPH-regular Human (70-30) 100 UNIT/ML injection Inject 40 Units into the skin daily.   Marland Kitchen losartan (COZAAR) 25 MG tablet Take 1 tablet (25 mg total) by mouth daily.  . magnesium oxide (MAG-OX) 400 (241.3 Mg) MG tablet Take 1 tablet (400 mg total) by mouth 2 (two) times daily.  . Multiple Vitamin (MULTIVITAMIN WITH MINERALS) TABS tablet Take 1 tablet by mouth daily.  . pantoprazole (PROTONIX) 40 MG tablet Take 1 tablet (40 mg total) by mouth daily.  . predniSONE (DELTASONE) 20 MG tablet Take 3 tablets (60 mg total) by mouth daily with breakfast for 7 days, THEN 2 tablets (  40 mg total) daily with breakfast.  .  torsemide (DEMADEX) 20 MG tablet Take 1 tablet (20 mg total) by mouth every Monday, Wednesday, and Friday.  . triamcinolone (NASACORT ALLERGY 24HR) 55 MCG/ACT AERO nasal inhaler Place 2 sprays into the nose daily.  Marland Kitchen warfarin (COUMADIN) 2.5 MG tablet Take on tablet daily or as directed (Patient taking differently: Take 2.5 mg by mouth daily. )  . amiodarone (PACERONE) 200 MG tablet Take 100-200 mg by mouth See admin instructions. Take 1 tablet (200 mg totally) by mouth in the morning; take 0.5 tablet (100 mg totally) in the evening  . Insulin Pen Needle (COMFORT EZ PEN NEEDLES) 32G X 6 MM MISC 1 application by Does not apply route at bedtime.    FAMILY HISTORY:  His He indicated that his mother is alive. He indicated that his father is deceased. He indicated that four of his five brothers are alive. He indicated that his maternal grandmother is deceased. He indicated that his maternal grandfather is deceased. He indicated that his paternal grandmother is deceased. He indicated that his paternal grandfather is deceased. He indicated that the status of his daughter is unknown. He indicated that the status of his son is unknown. He indicated that the status of his neg hx is unknown.   SOCIAL HISTORY: He  reports that he quit smoking about 19 years ago. His smoking use included cigarettes. He has a 29.00 pack-year smoking history. He has never used smokeless tobacco. He reports that he does not drink alcohol or use drugs.   ROS/SUBJECTIVE:  Overnight worsening respiratory status, placed on bipap  VITAL SIGNS: BP 111/75   Pulse (!) 30   Temp (!) 95.2 F (35.1 C) (Rectal)   Resp 19   Ht 5\' 10"  (1.778 m)   Wt 113.1 kg   SpO2 (!) 84%   BMI 35.78 kg/m   HEMODYNAMICS:    VENTILATOR SETTINGS: FiO2 (%):  [90 %-100 %] 90 %  INTAKE / OUTPUT: I/O last 3 completed shifts: In: 3349.1 [P.O.:720; I.V.:1241.1; Blood:378; IV Piggyback:1010] Out: 1502 [Urine:1500; Stool:2]  PHYSICAL  EXAMINATION: General:  Uncomfortable appearing Neuro:  Alert and oriented, follows commands, answers questions appropriately Cardiovascular:  lvad hum  Lungs:   rales throughout, increased wob Abdomen:  NTND Skin:  Warm, dry  LABS:  BMET Recent Labs  Lab 03/11/19 0035 03/12/19 0500 03/13/19 0356 03/13/19 0449 03/13/19 0616  NA 138 135  138 140 141 141  K 4.7 4.8  4.8 4.4 4.4 4.7  CL 103 101 103  --   --   CO2 22 21* 24  --   --   BUN 89* 107* 107*  --   --   CREATININE 2.02* 2.42* 2.16*  --   --   GLUCOSE 204* 269* 195*  --   --     Electrolytes Recent Labs  Lab 03/11/19 0035 03/12/19 0500 03/13/19 0238 03/13/19 0356  CALCIUM 8.6* 8.7*  --  8.8*  MG 2.9* 2.9* 3.0*  --   PHOS 4.3  --   --   --     CBC Recent Labs  Lab 03/10/19 0316 03/11/19 0035 03/13/19 0449 03/13/19 0454 03/13/19 0616  WBC 28.5* 26.4*  --  17.9*  --   HGB 12.0* 12.3* 13.6 11.9* 13.6  HCT 37.6* 37.5* 40.0 37.5* 40.0  PLT 236 213  --  108*  --     Coag's Recent Labs  Lab 03/11/19 0035 03/12/19 0500 03/13/19 0238  INR 5.9*  8.1* 6.7*    Sepsis Markers Recent Labs  Lab 03/10/19 1450 03/11/19 0035 03/12/19 0500  PROCALCITON 5.15 4.55 3.81    ABG Recent Labs  Lab 03/13/19 0228 03/13/19 0449 03/13/19 0616  PHART 7.398 7.423 7.436  PCO2ART 42.3 38.7 41.8  PO2ART 54.0* 34.0* 47.0*    Liver Enzymes Recent Labs  Lab 03/11/19 0035 03/12/19 0500 03/13/19 0356  AST 76* 104* 141*  ALT 127* 132* 161*  ALKPHOS 100 93 100  BILITOT 2.0* 1.9* 1.6*  ALBUMIN 1.9* 1.8* 2.0*    Cardiac Enzymes No results for input(s): TROPONINI, PROBNP in the last 168 hours.  Glucose Recent Labs  Lab 03/12/19 1113 03/12/19 1513 03/12/19 2003 03/12/19 2314 03/12/19 2349 03/13/19 0336  GLUCAP 257* 257* 248* 212* 194* 188*    Imaging DG CHEST PORT 1 VIEW  Result Date: 03/13/2019 CLINICAL DATA:  Respiratory distress EXAM: PORTABLE CHEST 1 VIEW COMPARISON:  March 11, 2019  FINDINGS: The patient is status post left ventricular assist device. A left-sided pacemaker is again noted. Slight interval worsening in the hazy/interstitial airspace opacities throughout both lungs. The cardiomediastinal silhouette is unchanged. IMPRESSION: Slight interval worsening in the hazy/interstitial airspace opacities. Electronically Signed   By: Prudencio Pair M.D.   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 Initially some component of concomitant infection possible but workup negative. Increased O2 requirements, intermittently requiring bipap, No CO2 retention, On prednisone 60mg  with initial plan for around one week at this dose followed by 40 for one week then decrease by 10mg  weekly.  Worsening respiratory status overnight still hypoxemic, uncomfortable on bipap P:   -discuss intubation with Dr. Vaughan Browner we will proceed with intubation this am -continue prednisone 60 -he is covered with abx in case concomitant infection  CARDIOVASCULAR A:  Acute on chronic HFrEF:  Normal regimen MWF torsemide 20mg , off bp meds, LVAD functioning appropriately, volume status difficult to assess.  rec'd gentle IVF 1/10 for low VAD flows, worsening renal function and low bp.   P:  -per cardiology, they are considering doing RHC and assessing need for CVVHD  RENAL A:   AKI: baseline cr around 1.4, Cr with slight improvement today to 2.16 from 2.42  P:   -IVF held  [place] family meet or Palliative Care meeting due by:  day 7  Pulmonary and [place] Pager: [phone] Vickki Muff MD PGY-3 Internal Medicine Pager #  [phone]   03/13/2019, 7:58 AM

## 2019-03-13 NOTE — Progress Notes (Signed)
eLink Physician-Brief Progress Note Patient Name: [name] [name] DOB: Jul 09, 1950 MRN: [id number]   Date of Service  03/13/2019  HPI/Events of Note  ABG    Component Value Date/Time   PHART 7.436 03/13/2019 0616   PCO2ART 41.8 03/13/2019 0616   PO2ART 47.0 (L) 03/13/2019 0616   HCO3 28.6 (H) 03/13/2019 0616   TCO2 30 03/13/2019 0616   ACIDBASEDEF 2.0 03/12/2019 1715   O2SAT 87.0 03/13/2019 0616   This was performed on BiPAP 12/6, FiO2 0.9.  CXR appears consistent with worsened bilateral infiltrates, likely reflecting increased pulmonary edema on a background of known ILD.   eICU Interventions  Requested change to BiPAP settings: 14/8, 100%. (higher EPAP and FiO2).  Requested RN immediately reach out to day intensivist to discuss diuresis to attempt to improve oxygenation.     Intervention Category Major Interventions: Respiratory failure - evaluation and management  Marily Lente Iviana Blasingame 03/13/2019, 7:07 AM

## 2019-03-13 NOTE — Progress Notes (Signed)
OT Cancellation Note  Patient Details Name: [name] [name] MRN: [id number] DOB: 1950/08/17   Cancelled Treatment:    Reason Eval/Treat Not Completed: Patient not medically ready.  Pt intubated.  Will check back.   Nilsa Nutting., OTR/L Acute Rehabilitation Services Pager [phone] Office [phone]   Lucille Passy M 03/13/2019, 10:36 AM

## 2019-03-14 ENCOUNTER — Inpatient Hospital Stay (HOSPITAL_COMMUNITY): Payer: PPO

## 2019-03-14 ENCOUNTER — Encounter (HOSPITAL_COMMUNITY): Admission: AD | Disposition: E | Payer: Self-pay | Source: Ambulatory Visit | Attending: Cardiology

## 2019-03-14 ENCOUNTER — Inpatient Hospital Stay: Payer: Self-pay

## 2019-03-14 DIAGNOSIS — J849 Interstitial pulmonary disease, unspecified: Secondary | ICD-10-CM

## 2019-03-14 LAB — VANCOMYCIN, RANDOM: Vancomycin Rm: 16

## 2019-03-14 LAB — POCT I-STAT 7, (LYTES, BLD GAS, ICA,H+H)
Acid-Base Excess: 4 mmol/L — ABNORMAL HIGH (ref 0.0–2.0)
Bicarbonate: 29.2 mmol/L — ABNORMAL HIGH (ref 20.0–28.0)
Calcium, Ion: 1.13 mmol/L — ABNORMAL LOW (ref 1.15–1.40)
HCT: 39 % (ref 39.0–52.0)
Hemoglobin: 13.3 g/dL (ref 13.0–17.0)
O2 Saturation: 96 %
Patient temperature: 97.5
Potassium: 6.2 mmol/L — ABNORMAL HIGH (ref 3.5–5.1)
Sodium: 142 mmol/L (ref 135–145)
TCO2: 31 mmol/L (ref 22–32)
pCO2 arterial: 45.1 mmHg (ref 32.0–48.0)
pH, Arterial: 7.418 (ref 7.350–7.450)
pO2, Arterial: 79 mmHg — ABNORMAL LOW (ref 83.0–108.0)

## 2019-03-14 LAB — GLUCOSE, CAPILLARY
Glucose-Capillary: 159 mg/dL — ABNORMAL HIGH (ref 70–99)
Glucose-Capillary: 166 mg/dL — ABNORMAL HIGH (ref 70–99)
Glucose-Capillary: 174 mg/dL — ABNORMAL HIGH (ref 70–99)
Glucose-Capillary: 189 mg/dL — ABNORMAL HIGH (ref 70–99)
Glucose-Capillary: 195 mg/dL — ABNORMAL HIGH (ref 70–99)
Glucose-Capillary: 196 mg/dL — ABNORMAL HIGH (ref 70–99)

## 2019-03-14 LAB — COMPREHENSIVE METABOLIC PANEL
ALT: 277 U/L — ABNORMAL HIGH (ref 0–44)
AST: 490 U/L — ABNORMAL HIGH (ref 15–41)
Albumin: 1.8 g/dL — ABNORMAL LOW (ref 3.5–5.0)
Alkaline Phosphatase: 87 U/L (ref 38–126)
Anion gap: 12 (ref 5–15)
BUN: 122 mg/dL — ABNORMAL HIGH (ref 8–23)
CO2: 24 mmol/L (ref 22–32)
Calcium: 8.5 mg/dL — ABNORMAL LOW (ref 8.9–10.3)
Chloride: 104 mmol/L (ref 98–111)
Creatinine, Ser: 2.79 mg/dL — ABNORMAL HIGH (ref 0.61–1.24)
GFR calc Af Amer: 26 mL/min — ABNORMAL LOW (ref >60)
GFR calc non Af Amer: 22 mL/min — ABNORMAL LOW (ref >60)
Glucose, Bld: 194 mg/dL — ABNORMAL HIGH (ref 70–99)
Potassium: 4.8 mmol/L (ref 3.5–5.1)
Sodium: 140 mmol/L (ref 135–145)
Total Bilirubin: 2.4 mg/dL — ABNORMAL HIGH (ref 0.3–1.2)
Total Protein: 6.5 g/dL (ref 6.5–8.1)

## 2019-03-14 LAB — CBC
HCT: 36.9 % — ABNORMAL LOW (ref 39.0–52.0)
Hemoglobin: 11.5 g/dL — ABNORMAL LOW (ref 13.0–17.0)
MCH: 25.3 pg — ABNORMAL LOW (ref 26.0–34.0)
MCHC: 31.2 g/dL (ref 30.0–36.0)
MCV: 81.1 fL (ref 80.0–100.0)
Platelets: 83 10*3/uL — ABNORMAL LOW (ref 150–400)
RBC: 4.55 MIL/uL (ref 4.22–5.81)
RDW: 19 % — ABNORMAL HIGH (ref 11.5–15.5)
WBC: 21.3 10*3/uL — ABNORMAL HIGH (ref 4.0–10.5)
nRBC: 0.4 % — ABNORMAL HIGH (ref 0.0–0.2)

## 2019-03-14 LAB — BODY FLUID CELL COUNT WITH DIFFERENTIAL
Eos, Fluid: 0 %
Lymphs, Fluid: 3 %
Monocyte-Macrophage-Serous Fluid: 6 % — ABNORMAL LOW (ref 50–90)
Neutrophil Count, Fluid: 91 % — ABNORMAL HIGH (ref 0–25)
Total Nucleated Cell Count, Fluid: 690 cu mm (ref 0–1000)

## 2019-03-14 LAB — LACTIC ACID, PLASMA: Lactic Acid, Venous: 3.1 mmol/L (ref 0.5–1.9)

## 2019-03-14 LAB — DIC (DISSEMINATED INTRAVASCULAR COAGULATION)PANEL
D-Dimer, Quant: 7.51 ug/mL-FEU — ABNORMAL HIGH (ref 0.00–0.50)
Fibrinogen: 436 mg/dL (ref 210–475)
INR: 6.9 (ref 0.8–1.2)
Platelets: 80 10*3/uL — ABNORMAL LOW (ref 150–400)
Prothrombin Time: 60.1 seconds — ABNORMAL HIGH (ref 11.4–15.2)
Smear Review: NONE SEEN
aPTT: 48 seconds — ABNORMAL HIGH (ref 24–36)

## 2019-03-14 LAB — MAGNESIUM: Magnesium: 3 mg/dL — ABNORMAL HIGH (ref 1.7–2.4)

## 2019-03-14 LAB — PROCALCITONIN: Procalcitonin: 2.54 ng/mL

## 2019-03-14 LAB — PROTIME-INR
INR: 7.4 (ref 0.8–1.2)
Prothrombin Time: 63.5 s — ABNORMAL HIGH (ref 11.4–15.2)

## 2019-03-14 LAB — LACTATE DEHYDROGENASE: LDH: 918 U/L — ABNORMAL HIGH (ref 98–192)

## 2019-03-14 SURGERY — RIGHT HEART CATH
Anesthesia: LOCAL

## 2019-03-14 MED ORDER — VANCOMYCIN HCL 1750 MG/350ML IV SOLN
1750.0000 mg | INTRAVENOUS | Status: DC
  Administered 2019-03-14 – 2019-03-16 (×2): 1750 mg via INTRAVENOUS
  Filled 2019-03-14 (×2): qty 350

## 2019-03-14 MED ORDER — MIDAZOLAM HCL 2 MG/2ML IJ SOLN
2.0000 mg | Freq: Once | INTRAMUSCULAR | Status: AC
  Administered 2019-03-14: 2 mg via INTRAVENOUS
  Filled 2019-03-14: qty 2

## 2019-03-14 MED ORDER — ROCURONIUM BROMIDE 50 MG/5ML IV SOLN
50.0000 mg | Freq: Once | INTRAVENOUS | Status: AC
  Administered 2019-03-14: 50 mg via INTRAVENOUS
  Filled 2019-03-14: qty 5

## 2019-03-14 MED ORDER — PREDNISONE 20 MG PO TABS
60.0000 mg | ORAL_TABLET | Freq: Every day | ORAL | Status: DC
  Administered 2019-03-14 – 2019-03-15 (×2): 60 mg
  Filled 2019-03-14 (×3): qty 3

## 2019-03-14 MED ORDER — SODIUM CHLORIDE 0.9% FLUSH: 10.0000 mL | INTRAVENOUS | Status: DC | PRN

## 2019-03-14 MED ORDER — ROCURONIUM BROMIDE 50 MG/5ML IV SOLN
100.0000 mg | Freq: Once | INTRAVENOUS | Status: DC
  Filled 2019-03-14: qty 10

## 2019-03-14 MED ORDER — FENTANYL 2500MCG IN NS 250ML (10MCG/ML) PREMIX INFUSION
0.0000 ug/h | INTRAVENOUS | Status: DC
  Administered 2019-03-14 – 2019-03-15 (×2): 50 ug/h via INTRAVENOUS
  Filled 2019-03-14 (×4): qty 250

## 2019-03-14 MED ORDER — SODIUM CHLORIDE 0.9% FLUSH
10.0000 mL | Freq: Two times a day (BID) | INTRAVENOUS | Status: DC
  Administered 2019-03-14 – 2019-03-16 (×2): 10 mL

## 2019-03-14 MED ORDER — VANCOMYCIN VARIABLE DOSE PER UNSTABLE RENAL FUNCTION (PHARMACIST DOSING): Status: DC

## 2019-03-14 MED ORDER — ASPIRIN 81 MG PO CHEW
81.0000 mg | CHEWABLE_TABLET | ORAL | Status: AC
  Administered 2019-03-14: 81 mg

## 2019-03-14 MED ORDER — PRO-STAT SUGAR FREE PO LIQD
60.0000 mL | Freq: Two times a day (BID) | ORAL | Status: DC
  Administered 2019-03-14 – 2019-03-15 (×2): 60 mL
  Filled 2019-03-14 (×2): qty 60

## 2019-03-14 MED ORDER — ETOMIDATE 2 MG/ML IV SOLN
20.0000 mg | Freq: Once | INTRAVENOUS | Status: AC
  Administered 2019-03-14: 20 mg via INTRAVENOUS

## 2019-03-14 NOTE — Procedures (Signed)
Bronchoscopy Procedure Note JYRIN MORIARTY [id number] 1950/08/04  Procedure: Bronchoscopy Indications: Diagnostic evaluation of the airways and Obtain specimens for culture and/or other diagnostic studies  Procedure Details Consent: Risks of procedure as well as the alternatives and risks of each were explained to the (patient/caregiver).  Consent for procedure obtained. Time Out: Verified patient identification, verified procedure, site/side was marked, verified correct patient position, special equipment/implants available, medications/allergies/relevent history reviewed, required imaging and test results available.  Performed  In preparation for procedure, patient was given 100% FiO2 and bronchoscope lubricated. Sedation: Benzodiazepines and Etomidate  Airway entered and the following bronchi were examined: RUL, RML, RLL, LUL, LLL and Bronchi.   Thick secretions noted in the trachea. The rest of the bronchial tree was normal with no mucus plugs Lavage performed in the right lower lobe and send for cultures  Evaluation Hemodynamic Status: BP stable throughout; O2 sats: stable throughout Patient's Current Condition: stable Specimens:  Sent purulent fluid Complications: No apparent complications Patient did tolerate procedure well.  Marshell Garfinkel MD [facility] Please see [email or web address] for pager details.  03/29/2019, 2:04 PM

## 2019-03-14 NOTE — Progress Notes (Signed)
PULMONARY / CRITICAL CARE MEDICINE   Name: [name] [name] MRN: [id number] DOB: [birth year]    ADMISSION DATE:  02/27/2019 CONSULTATION DATE:  02/04/2019, 03/09/19  REFERRING MD:  Bensimhon  CHIEF COMPLAINT:  hypoxemia  HISTORY OF PRESENT ILLNESS:   34 yoM with extensive cardiac history with LVAD presenting with acute onset hypoxia possibly concerning for HF exacerbation vs amiodarone toxicity vs unknown process.  COVID negative x 3. 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   PAST MEDICAL HISTORY :  He  has a past medical history of AICD (automatic cardioverter/defibrillator) present (02/05/2014), Atrial flutter ([place]) (04/2012), CAD (coronary artery disease) (2992,4268 X 2 ), CHF (congestive heart failure) ([place]), Chronic anticoagulation, Chronic systolic heart failure ([place]), CKD (chronic kidney disease), Diabetic retinopathy ([place]), DM type 2 (diabetes mellitus, type 2) ([place]), HTN (hypertension), Hypercholesteremia, ICD (implantable cardiac defibrillator) in place, Ischemic cardiomyopathy (March 2015), Nephrolithiasis, and Ventricular tachycardia ([place]).  PAST SURGICAL HISTORY: He  has a past surgical history that includes Percutaneous coronary stent intervention (pci-s) (January 2002); Cardiac defibrillator placement (2007); Percutaneous coronary stent intervention (pci-s) (June 2002); Percutaneous coronary stent intervention (pci-s) (July 2006); Biv icd genertaor change out (02/05/2014); Atrial flutter ablation (N/A, 05/19/2012); bi-ventricular implantable cardioverter defibrillator upgrade (N/A, 02/05/2014); RIGHT/LEFT HEART CATH AND CORONARY ANGIOGRAPHY (N/A, 02/18/2017); RIGHT HEART CATH (N/A, 07/22/2017); IR US Guide Vasc Access Right (08/12/2017); IR Fluoro Guide CV Line Right (08/12/2017); RIGHT HEART CATH (N/A, 08/13/2017); Insertion of implantable left ventricular assist device (N/A, 08/16/2017); TEE without cardioversion (N/A, 08/16/2017);  Tricuspid valve replacement (N/A, 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  Allergies  Allergen Reactions  . Penicillins Hives, Itching and Other (See Comments)    Did it involve swelling of the face/tongue/throat, SOB, or low BP? No Did it involve sudden or severe rash/hives, skin peeling, or any reaction on the inside of your mouth or nose? No Did you need to seek medical attention at a hospital or doctor's office? Unknown When did it last happen?childhood allergy If all above answers are "NO", may proceed with cephalosporin use.    Marland Kitchen Alcohol-Sulfur [Sulfur] Other (See Comments)    Burns skin    No current facility-administered medications on file prior to encounter.   Current Outpatient Medications on File Prior to Encounter  Medication Sig  . acetaminophen (TYLENOL) 500 MG tablet Take 1,000 mg by mouth every 6 (six) hours as needed for moderate pain or headache.   Marland Kitchen ascorbic acid (VITAMIN C) 500 MG tablet Take 1 tablet (500 mg total) by mouth 2 (two) times daily.  Marland Kitchen aspirin 81 MG chewable tablet Chew 1 tablet (81 mg total) by mouth daily.  Marland Kitchen gabapentin (NEURONTIN) 300 MG capsule Take 2 capsules (600 mg total) by mouth 2 (two) times daily.  . insulin NPH-regular Human (70-30) 100 UNIT/ML injection Inject 40 Units into the skin daily.   Marland Kitchen losartan (COZAAR) 25 MG tablet Take 1 tablet (25 mg total) by mouth daily.  . magnesium oxide (MAG-OX) 400 (241.3 Mg) MG tablet Take 1 tablet (400 mg total) by mouth 2 (two) times daily.  . Multiple Vitamin (MULTIVITAMIN WITH MINERALS) TABS tablet Take 1 tablet by mouth daily.  . pantoprazole (PROTONIX) 40 MG tablet Take 1 tablet (40 mg total) by mouth daily.  . predniSONE (DELTASONE) 20 MG tablet Take 3 tablets (60 mg total) by mouth daily with breakfast for 7 days, THEN 2 tablets (  40 mg total) daily with breakfast.  .  torsemide (DEMADEX) 20 MG tablet Take 1 tablet (20 mg total) by mouth every Monday, Wednesday, and Friday.  . triamcinolone (NASACORT ALLERGY 24HR) 55 MCG/ACT AERO nasal inhaler Place 2 sprays into the nose daily.  Marland Kitchen warfarin (COUMADIN) 2.5 MG tablet Take on tablet daily or as directed (Patient taking differently: Take 2.5 mg by mouth daily. )  . amiodarone (PACERONE) 200 MG tablet Take 100-200 mg by mouth See admin instructions. Take 1 tablet (200 mg totally) by mouth in the morning; take 0.5 tablet (100 mg totally) in the evening  . Insulin Pen Needle (COMFORT EZ PEN NEEDLES) 32G X 6 MM MISC 1 application by Does not apply route at bedtime.    FAMILY HISTORY:  His He indicated that his mother is alive. He indicated that his father is deceased. He indicated that four of his five brothers are alive. He indicated that his maternal grandmother is deceased. He indicated that his maternal grandfather is deceased. He indicated that his paternal grandmother is deceased. He indicated that his paternal grandfather is deceased. He indicated that the status of his daughter is unknown. He indicated that the status of his son is unknown. He indicated that the status of his neg hx is unknown.   SOCIAL HISTORY: He  reports that he quit smoking about 19 years ago. His smoking use included cigarettes. He has a 29.00 pack-year smoking history. He has never used smokeless tobacco. He reports that he does not drink alcohol or use drugs.   ROS/SUBJECTIVE:  Overnight worsening respiratory status, placed on bipap  VITAL SIGNS: BP (!) 81/68   Pulse 77   Temp (!) 97.5 F (36.4 C) (Axillary)   Resp 19   Ht 5\' 10"  (1.778 m)   Wt 113.6 kg   SpO2 98%   BMI 35.93 kg/m   HEMODYNAMICS:    VENTILATOR SETTINGS: Vent Mode: PRVC FiO2 (%):  [100 %] 100 % Set Rate:  [16 bmp-22 bmp] 22 bmp Vt Set:  [580 mL] 580 mL PEEP:  [5 cmH20] 5 cmH20 Plateau Pressure:  [15 cmH20-37 cmH20] 31 cmH20  INTAKE / OUTPUT: I/O last  3 completed shifts: In: 1504.8 [I.V.:1004.8; IV Piggyback:500] Out: [id number] [Urine:1700; Emesis/NG output:50]  PHYSICAL EXAMINATION: General:  Uncomfortable appearing Neuro:  Alert and oriented, follows commands, answers questions appropriately Cardiovascular:  lvad hum  Lungs:   rales throughout, increased wob Abdomen:  NTND Skin:  Warm, dry  LABS:  BMET Recent Labs  Lab 03/12/19 0500 03/13/19 0356 03/13/19 1638 03/11/2019 0332 03/06/2019 0537  NA 135  138 140 141 140 142  K 4.8  4.8 4.4 5.2* 4.8 6.2*  CL 101 103  --  104  --   CO2 21* 24  --  24  --   BUN 107* 107*  --  122*  --   CREATININE 2.42* 2.16*  --  2.79*  --   GLUCOSE 269* 195*  --  194*  --     Electrolytes Recent Labs  Lab 03/11/19 0035 03/12/19 0500 03/13/19 0238 03/13/19 0356 03/21/2019 0332  CALCIUM 8.6* 8.7*  --  8.8* 8.5*  MG 2.9* 2.9* 3.0*  --  3.0*  PHOS 4.3  --   --   --   --     CBC Recent Labs  Lab 03/11/19 0035 03/13/19 0454 03/13/19 1638 03/03/2019 0332 03/03/2019 0537  WBC 26.4* 17.9*  --  21.3*  --   HGB 12.3* 11.9* 14.3 11.5*  13.3  HCT 37.5* 37.5* 42.0 36.9* 39.0  PLT 213 108*  --  83*  --     Coag's Recent Labs  Lab 03/12/19 0500 03/13/19 0238 03/30/2019 0332  INR 8.1* 6.7* 7.4*    Sepsis Markers Recent Labs  Lab 03/10/19 1450 03/11/19 0035 03/12/19 0500  PROCALCITON 5.15 4.55 3.81    ABG Recent Labs  Lab 03/13/19 1129 03/13/19 1638 03/07/2019 0537  PHART 7.291* 7.340* 7.418  PCO2ART 56.2* 47.1 45.1  PO2ART 69.0* 69.0* 79.0*    Liver Enzymes Recent Labs  Lab 03/12/19 0500 03/13/19 0356 03/16/2019 0332  AST 104* 141* 490*  ALT 132* 161* 277*  ALKPHOS 93 100 87  BILITOT 1.9* 1.6* 2.4*  ALBUMIN 1.8* 2.0* 1.8*    Cardiac Enzymes No results for input(s): TROPONINI, PROBNP in the last 168 hours.  Glucose Recent Labs  Lab 03/13/19 1126 03/13/19 1604 03/13/19 1950 03/13/19 2345 03/31/2019 0535 03/05/2019 0805  GLUCAP 153* 165* 164* 171* 189* 166*     Imaging DG CHEST PORT 1 VIEW  Result Date: 03/13/2019 CLINICAL DATA:  Post intubation and enteric tube placement. EXAM: PORTABLE CHEST 1 VIEW COMPARISON:  Chest x-ray from same day at 0609 hours. FINDINGS: Interval placement of an endotracheal tube with the tip 1.5 cm above the carina. New enteric tube within the stomach. Unchanged left chest wall pacemaker. Unchanged cardiomegaly and LVAD. Hazy granular interstitial and airspace opacities throughout both lungs, similar to prior study. No pneumothorax or large pleural effusion. No acute osseous abnormality. IMPRESSION: 1. Appropriately positioned endotracheal and enteric tubes. 2. Unchanged diffuse bilateral interstitial and airspace disease. Electronically Signed   By: Titus Dubin M.D.   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 Initially some component of concomitant infection possible but workup negative. Increased O2 requirements, intermittently requiring bipap, No CO2 retention, On prednisone 60mg  with initial plan for around one week at this dose followed by 40 for one week then decrease by 10mg  weekly.  Worsening respiratory status overnight still hypoxemic, uncomfortable on bipap P:   -continue prednisone 60 -he is covered with abx in case concomitant infection   Acute on chronic HFrEF:  Normal regimen MWF torsemide 20mg , off bp meds, LVAD functioning appropriately, volume status difficult to assess.  rec'd gentle IVF 1/10 for low VAD flows, worsening renal function and low bp.   P:  -per cardiology, they are considering doing RHC and assessing need for CVVHD   AKI on CKD IIIB: baseline cr around 1.5-2, Cr trend,  2.16 >2.42>2.79 P:     FAMILY    - Inter-disciplinary family meet or Palliative Care meeting due by:  day 7  Pulmonary and [place] Pager: [phone] Vickki Muff MD PGY-3 Internal Medicine Pager # [phone]   03/28/2019, 8:39 AM

## 2019-03-14 NOTE — Progress Notes (Signed)
Pharmacy Antibiotic Note  [name] [name] is a 69 y.o. male admitted on 02/24/2019 with pneumonia.  Pharmacy has been consulted for vancomycin and meropenem dosing.  Cr elevated today, vancomycin random therapeutic at 16 mcg/ml.  Plan: -Continue vancomycin 1750mg  IV q48h -Meropenem 2g IV every 12 hours -Monitor renal fx, clinical pic, cx results, and vanc levels as appropriate.  Height: 5\' 10"  (177.8 cm) Weight: 250 lb 7.1 oz (113.6 kg) IBW/kg (Calculated) : 73  Temp (24hrs), Avg:97.5 F (36.4 C), Min:97.1 F (36.2 C), Max:97.7 F (36.5 C)  Recent Labs  Lab 03/09/19 0250 03/10/19 0316 03/11/19 0035 03/12/19 0500 03/13/19 0356 03/13/19 0454 03/16/2019 0332 03/23/2019 1020 03/13/2019 1027  WBC 15.8* 28.5* 26.4*  --   --  17.9* 21.3*  --   --   CREATININE 1.83* 2.26* 2.02* 2.42* 2.16*  --  2.79*  --   --   LATICACIDVEN  --   --   --   --   --   --   --   --  3.1*  VANCORANDOM  --   --   --   --   --   --   --  16  --     Estimated Creatinine Clearance: 31.5 mL/min (A) (by C-G formula based on SCr of 2.79 mg/dL (H)).    Allergies  Allergen Reactions  . Penicillins Hives, Itching and Other (See Comments)    Did it involve swelling of the face/tongue/throat, SOB, or low BP? No Did it involve sudden or severe rash/hives, skin peeling, or any reaction on the inside of your mouth or nose? No Did you need to seek medical attention at a hospital or doctor's office? Unknown When did it last happen?childhood allergy If all above answers are "NO", may proceed with cephalosporin use.    Marland Kitchen Alcohol-Sulfur [Sulfur] Other (See Comments)    Burns skin    Antimicrobials this admission: Vancomycin 1/8 >>  Meropenem 1/8 >>    Microbiology results: 12/29 BCx: NGTD 12/29 Resp PCR: neg  1/8 Sputum: ordered  1/8 MRSA PCR: neg  Thank you for allowing pharmacy to be a part of this patient's care.  Arrie Senate, PharmD, BCPS Clinical Pharmacist [phone] Please check AMION  for all [place] numbers 04/02/2019

## 2019-03-14 NOTE — Progress Notes (Signed)
CRITICAL VALUE ALERT  Critical Value:  INR 7.7  Date & Time Notied:  03/09/2019 0504  Provider Notified: Trudy,MD  Orders Received/Actions taken: no new orders at this time

## 2019-03-14 NOTE — Progress Notes (Signed)
PT Cancellation Note  Patient Details Name: [name] [name] MRN: [id number] DOB: 07/13/1950   Cancelled Treatment:    Reason Eval/Treat Not Completed: Patient not medically ready; continues to decline and not appropriate for participation for PT treatment. Will sign off. Please reorder when medically stable and appropriate to participate.  Mabeline Caras, PT, DPT Acute Rehabilitation Services  Pager [phone] Office [place] 03/19/2019, 2:25 PM

## 2019-03-14 NOTE — Progress Notes (Signed)
Peripherally Inserted Central Catheter/Midline Placement  The IV Nurse has discussed with the patient and/or persons authorized to consent for the patient, the purpose of this procedure and the potential benefits and risks involved with this procedure.  The benefits include less needle sticks, lab draws from the catheter, and the patient may be discharged home with the catheter. Risks include, but not limited to, infection, bleeding, blood clot (thrombus formation), and puncture of an artery; nerve damage and irregular heartbeat and possibility to perform a PICC exchange if needed/ordered by physician.  Alternatives to this procedure were also discussed.  Bard Power PICC patient education guide, fact sheet on infection prevention and patient information card has been provided to patient /or left at bedside.    PICC/Midline Placement Documentation  PICC Double Lumen 03/18/2019 PICC Right Brachial 42 cm 0 cm (Active)  Indication for Insertion or Continuance of Line Vasoactive infusions 03/26/2019 1500  Exposed Catheter (cm) 0 cm 03/28/2019 1500  Site Assessment Clean;Dry;Intact 03/12/2019 1500  Lumen #1 Status Flushed;Blood return noted 03/04/2019 1500  Lumen #2 Status Flushed;Blood return noted 03/22/2019 1500  Dressing Type Transparent 03/30/2019 1500  Dressing Status Clean;Dry;Intact;Antimicrobial disc in place 03/23/2019 1500  Dressing Change Due 03/21/19 03/08/2019 1500       Jule Economy Horton 03/13/2019, 3:59 PM

## 2019-03-14 NOTE — Progress Notes (Signed)
RT NOTE: RT x2 pulled ETT back 1cm per MD order. ETT is now 25cm @ lips. RT will continue to monitor.

## 2019-03-14 NOTE — Progress Notes (Signed)
LVAD Coordinator Rounding Note:  Admitted 02/16/2019 per Dr. 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  HM III LVAD implanted on 08/16/17 by Dr. Prescott Gum  under Destination Therapy criteria.  Pt sedated and intubated. Plan for bronchoscopy this afternoon.    Vital signs: Temp: 97.6 HR: 71 Doppler Pressure: not done Art BP: 76/56 (71) O2 Sat: 94% on 100% FiO2 on vent Wt: 236.5>238.7>248.9>248.9>247>244.9>251>254>243.6>249.3>250.4 lbs   LVAD interrogation reveals:  Speed: 5100 Flow: 3.4 Power:  3.3w PI: 2.3 Alarms: none Events:  5 PI event today 36 PI events overnight Hematocrit: 20 - do not adjust  Fixed speed: 5100  Low speed limit: 4800   Drive Line:  Dressing C/D/I; anchor intact and accurately applied. Weekly dressing changes per [facility] nurse; next dressing change due 03/21/19.  Labs:  LDH trend:  475>352>347>360>328>365>396>485>493>575>918  INR trend: 2.9>3.1>3.4>3.2>3.1>3.0>2.6>3.1>4.7>6.7>6.9  WBC trend: 10.0>12.8>15.2>12.7>14.2>16.0>13.5>15.8>28.5>17.9>21.3  AST trend: 141>490  ALT trend: 161>277  Cultures: 02/17/2019 - respiratory panel>>negative final 02/11/2019 - blood cxs>negative/final 03/30/2019- respiratory panel from bronch washings>> 03/06/2019- sputum cx>> 03/19/2019- blood cx>>   Anticoagulation Plan: -INR Goal: 2.0 - 2.5 -ASA Dose: 81 mg daily  Device:Medtronic 3 Lead Therapies: ON:  VF 200 bpm, FVT 200 - 240; VT 158 - 200 - ATP Therapies: AT/AF monitor on 171 bpm Pacing: DDD 60 Last check: 11/10/18  Plan/Recommendations:  1. Page VAD coordinator with any VAD equipment or drive line issues. 2. Weekly dressing changes per BS nurse; next change due 03/21/19.  Emerson Monte RN [place] Coordinator  Office: [phone]  24/7 Pager: [phone]

## 2019-03-14 NOTE — Plan of Care (Signed)
Currently on vent bronchoscopy done today as well as PICC line. Hepatorenal syndrome with increased LFT and decreased platlets. DIC panel negative. 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

## 2019-03-14 NOTE — Progress Notes (Signed)
Inpatient Rehabilitation Admissions Coordinator  I will sign off at this time.  Danne Baxter, RN, MSN Rehab Admissions Coordinator [phone] 03/11/2019 5:57 PM

## 2019-03-14 NOTE — Progress Notes (Addendum)
Patient ID: [name] [name], male   DOB: Jan 27, 1951, 69 y.o.   MRN: [id number]   Advanced Heart Failure VAD Team Note  PCP-Cardiologist: No primary care provider on file.   Subjective:    Worsening MSOF. Required intubation 1/11.  SCr rising, now 2.79. BUN 122. K 4.8 on BMP (6.2 on iSTAT). Rhythm stable on tele.   LDH continues to rise, 493 -> 468 -> 555 -> 575--> 918 today. DIC panel pending. But platelets resulted and extremely low at 80 K. INR 7.4  On Vasopressin 0.3 for BP support. MAPs in the 28s. Lasix has been held with low VAD flows and rising creatinine. Got IVFs 1/10. Now off. Less PI events on VAD interrogation today. Plan was for RHC today but ? Hold given possible DIC.    LVAD INTERROGATION:  HeartMate 3 LVAD:   Flow 4.2 liters/min, speed 5150, power 3.4 PI 3.3. Multiple PI events but less frequent past 24 hrs (5 PI events) VAD interrogated personally. Parameters stable.  Objective:    Vital Signs:   Temp:  [97.1 F (36.2 C)-98.5 F (36.9 C)] 97.5 F (36.4 C) (01/12 0800) Pulse Rate:  [30-143] 71 (01/12 0839) Resp:  [13-26] 22 (01/12 0839) BP: (79-105)/(64-86) 81/68 (01/12 0800) SpO2:  [91 %-100 %] 98 % (01/12 0500) Arterial Line BP: (65-104)/(47-90) 81/65 (01/12 0800) FiO2 (%):  [100 %] 100 % (01/12 0839) Weight:  [113.6 kg] 113.6 kg (01/12 0400) Last BM Date: 03/12/18 Mean arterial Pressure 70s on VP   Intake/Output:   Intake/Output Summary (Last 24 hours) at 03/29/2019 0944 Last data filed at 03/03/2019 0600 Gross per 24 hour  Intake 782.08 ml  Output 950 ml  Net -167.92 ml     Physical Exam   Physical Exam: General:  Critically ill [name], intubated and sedated  HEENT: normal Neck: supple. no JVD. Carotids 2+ bilat; no bruits. No lymphadenopathy or thyromegaly appreciated. Cor: LVAD Hum. Driveline site stable. No drainage or erythema (LVAD RN examined at bedside) Lungs: course BS bilaterally, Intubated  Abdomen: obese, soft, nontender, nondistended.  No hepatosplenomegaly. No bruits or masses. Good bowel sounds. Extremities: no cyanosis, clubbing, rash, 1+ bilateral LEE edema Neuro: intubated and sedated   Telemetry   AF 70s Personally reviewed   Labs   Basic Metabolic Panel: Recent Labs  Lab 03/10/19 0316 03/11/19 0035 03/12/19 0500 03/13/19 0238 03/13/19 0356 03/13/19 0616 03/13/19 1129 03/13/19 1638 03/24/2019 0332 03/26/2019 0537  NA 132* 138 135  138  --  140 141 141 141 140 142  K 5.1 4.7 4.8  4.8  --  4.4 4.7 5.1 5.2* 4.8 6.2*  CL 95* 103 101  --  103  --   --   --  104  --   CO2 21* 22 21*  --  24  --   --   --  24  --   GLUCOSE 263* 204* 269*  --  195*  --   --   --  194*  --   BUN 80* 89* 107*  --  107*  --   --   --  122*  --   CREATININE 2.26* 2.02* 2.42*  --  2.16*  --   --   --  2.79*  --   CALCIUM 8.5* 8.6* 8.7*  --  8.8*  --   --   --  8.5*  --   MG 2.6* 2.9* 2.9* 3.0*  --   --   --   --  3.0*  --  PHOS  --  4.3  --   --   --   --   --   --   --   --     Liver Function Tests: Recent Labs  Lab 03/10/19 0316 03/11/19 0035 03/12/19 0500 03/13/19 0356 03/22/2019 0332  AST 121* 76* 104* 141* 490*  ALT 121* 127* 132* 161* 277*  ALKPHOS 94 100 93 100 87  BILITOT 2.5* 2.0* 1.9* 1.6* 2.4*  PROT 7.4 7.6 7.1 6.6 6.5  ALBUMIN 2.0* 1.9* 1.8* 2.0* 1.8*   No results for input(s): LIPASE, AMYLASE in the last 168 hours. Recent Labs  Lab 03/10/19 0316  AMMONIA 24    CBC: Recent Labs  Lab 03/09/19 0250 03/10/19 0316 03/11/19 0035 03/13/19 0454 03/13/19 0616 03/13/19 1129 03/13/19 1638 03/07/2019 0332 03/28/2019 0537  WBC 15.8* 28.5* 26.4* 17.9*  --   --   --  21.3*  --   NEUTROABS  --   --  24.9*  --   --   --   --   --   --   HGB 11.8* 12.0* 12.3* 11.9* 13.6 13.9 14.3 11.5* 13.3  HCT 36.6* 37.6* 37.5* 37.5* 40.0 41.0 42.0 36.9* 39.0  MCV 80.8 80.0 79.8* 81.0  --   --   --  81.1  --   PLT 283 236 213 108*  --   --   --  83*  --     INR: Recent Labs  Lab 03/10/19 1450 03/11/19 0035  03/12/19 0500 03/13/19 0238 03/07/2019 0332  INR 4.8* 5.9* 8.1* 6.7* 7.4*    Other results:     Imaging   DG CHEST PORT 1 VIEW  Result Date: 03/28/2019 CLINICAL DATA:  Endotracheal tube position EXAM: PORTABLE CHEST 1 VIEW COMPARISON:  03/13/2019 FINDINGS: Endotracheal tube 2.5 cm above the carina.  NG tube in the stomach. LVAD unchanged in position.  AICD unchanged in position. Diffuse bilateral airspace disease with mild interval improvement. Left lower lobe atelectasis/infiltrate unchanged. No pneumothorax. IMPRESSION: Support lines in good position. Diffuse bilateral airspace disease with mild interval improvement. Electronically Signed   By: Franchot Gallo M.D.   On: 03/09/2019 09:40   DG CHEST PORT 1 VIEW  Result Date: 03/13/2019 CLINICAL DATA:  Post intubation and enteric tube placement. EXAM: PORTABLE CHEST 1 VIEW COMPARISON:  Chest x-ray from same day at 0609 hours. FINDINGS: Interval placement of an endotracheal tube with the tip 1.5 cm above the carina. New enteric tube within the stomach. Unchanged left chest wall pacemaker. Unchanged cardiomegaly and LVAD. Hazy granular interstitial and airspace opacities throughout both lungs, similar to prior study. No pneumothorax or large pleural effusion. No acute osseous abnormality. IMPRESSION: 1. Appropriately positioned endotracheal and enteric tubes. 2. Unchanged diffuse bilateral interstitial and airspace disease. Electronically Signed   By: Titus Dubin M.D.   On: 03/13/2019 10:39   DG CHEST PORT 1 VIEW  Result Date: 03/13/2019 CLINICAL DATA:  Respiratory distress EXAM: PORTABLE CHEST 1 VIEW COMPARISON:  March 11, 2019 FINDINGS: The patient is status post left ventricular assist device. A left-sided pacemaker is again noted. Slight interval worsening in the hazy/interstitial airspace opacities throughout both lungs. The cardiomediastinal silhouette is unchanged. IMPRESSION: Slight interval worsening in the hazy/interstitial airspace  opacities. Electronically Signed   By: Prudencio Pair M.D.   On: 03/13/2019 06:30     Medications:     Scheduled Medications: . sodium chloride   Intravenous Once  . aspirin  81 mg Per Tube Daily  .  aspirin  81 mg Per Tube Pre-Cath  . chlorhexidine gluconate (MEDLINE KIT)  15 mL Mouth Rinse BID  . Chlorhexidine Gluconate Cloth  6 each Topical Daily  . feeding supplement (PRO-STAT SUGAR FREE 64)  30 mL Per Tube TID WC  . gabapentin  600 mg Per Tube BID  . insulin aspart  0-15 Units Subcutaneous Q4H  . mouth rinse  15 mL Mouth Rinse 10 times per day  . pantoprazole sodium  40 mg Per Tube Daily  . predniSONE  60 mg Per Tube Q breakfast  . sodium chloride flush  10-40 mL Intracatheter Q12H  . sodium chloride flush  3 mL Intravenous Q12H  . Warfarin - Pharmacist Dosing Inpatient   Does not apply q1800    Infusions: . sodium chloride 10 mL/hr at 04/01/2019 0600  . sodium chloride    . fentaNYL infusion INTRAVENOUS    . meropenem (MERREM) IV Stopped (03/07/2019 0006)  . norepinephrine (LEVOPHED) Adult infusion 2 mcg/min (03/03/2019 0600)  . vancomycin Stopped (03/12/19 1127)  . vasopressin (PITRESSIN) infusion - *FOR SHOCK* 0.03 Units/min (03/08/2019 0600)    PRN Medications: sodium chloride, acetaminophen, fentaNYL (SUBLIMAZE) injection, fentaNYL (SUBLIMAZE) injection, midazolam, ondansetron (ZOFRAN) IV, sodium chloride flush, sodium chloride flush   Assessment/Plan:    1. Acute hypoxemic respiratory failure: Recently seen in the ER and now admitted from clinic with marked hypoxemic requiring NRBM.  CXR with diffuse bilateral interstitial and airspace disease.  12/27 ESR elevated to 80 => 68 this admission with CRP 23.  PCT elevated mildly 0.67 => 0.7.  Respiratory virus and COVID-19 negative.  Blood cultures NGTD.  Afebrile, WBCs 21K on steroids.  Also noted to be volume overloaded on exam with significant JVD, weight gain, and peripheral edema. High resolution CT chest with diffuse GGO that  could be consistent with amiodarone toxicity versus infection.  Autoimmune panel negative.  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  Amiodarone has been stopped and he has been started on steroids.  - Decompensated on 1/7 moved to ICU. Worsening respiratory status and Intubated 11/11 - Initial PCT 0.7, increased to  5. Started van/meropenem for possible PNA on 1/8. CXR unchanged. Repeat PCT pending.  - He does have volume overload on exam but mostly RV failure. Do not think we can get much more from diuresis as left sided pressures typically decongested well with VAD. Plan RHC possibly today to further evaluate +/- Swan placement in cath lab.  - Continue serial ABGs  2. 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  MDT CRT-D device.  LVAD parameters stable on interrogation.  - Has been diuresed aggressively this admission.  Creatinine bumped to 2.4. IVFs given. SCr improved but now trending back up, 2.79 today. IVFs now off. Plan possible RHC today to better assess volume status. Timing of RHC TBD based on pending labs.   - INR goal 2-2.5 with LVAD, continue ASA 81. Warfarin currently on hold for supra therapeutic INR. - INR today 4.8 -> 5.9 -> 8.1.-> 6.7->7.1 - Yesterday got 1u FFP and 2.43m po VIT k  - LDH higher at 475 at admission but with acute inflammatory disease, suspect pump thrombus formation less likely. LDH increasing 493 -> 468 -> 555 -> 575--> 918 today. DIC panel pending. - On VP for BP support. MAPs in the 70s  3. Atrial fibrillation permanent:  - rate controlled. No change  4. 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 Frequent  PVCs.  - On 1/4 started mexiletine 200 mg twice a day to suppress PVCs.  - Will stop all non-essential meds from now as clinical deterioration seems multifactorial  5. AKI on CKD stage 3:  - creatinine trending back up, now 2.79 - BUN  122. Suspect he is uremic as well.  - Likely  needs CVVHD  6. Diabetes: Blood glucose up with steroids.  - Continue SSI - Continue home 70/30.   7. Severe Malnutrition - Albumin 1.8. Consulted dietitian - po intake poor  8. Transaminitis/liver failure - possibly due to amio.  - amio stopped -  Ammonia level ok 1/8 (24)  Length of Stay: [address], PA-C 03/19/2019, 9:44 AM  VAD Team --- VAD ISSUES ONLY--- Pager [phone] (7am - 7am)  Advanced Heart Failure Team  Pager [phone] (M-F; 7a - 4p)  Please contact [facility] for night-coverage after hours (4p -7a ) and weekends on [email or web address]  Agree with above  He continues to deteriorate. Now on vent requiring 100% FiO2. Also on vasopressin for BP support LDH climbing rapidly but no other signs of pump thrombosis. INR continues to rise despite FFP and vitamin K.  PCT 2.54 despite coverage with meropenem. PLTs falling  General:  Critically ill on vent HEENT: normal  Neck: supple. JVP to jaw.  Carotids 2+ bilat; no bruits. No lymphadenopathy or thryomegaly appreciated. Cor: LVAD hum.  Lungs: Coarse with crackles  Abdomen: obese soft, nontender, non-distended. No hepatosplenomegaly. No bruits or masses. Good bowel sounds. Driveline site clean. Anchor in place.  Extremities: no cyanosis, clubbing, rash. Warm 2+  edema  Neuro: sedated on vent  He is deteriorating rapidly and now with MSOF. Etiology unclear. Amio toxicity initially improving with steroids but now his condition is much worse. Suspect sepsis but no clear source and cultures worse. Now with renal and liver failure. Hepato-renal syndrome possible but he has not struggled with significant cirrhosis previously.  I have d/w CCM and will do bronch today. Check DIC screen. Auto-immune panels have been negative. Continue supportive care and abx. Stop all non-essential meds. Seriousness of situation d/w his wife.    CRITICAL CARE Performed by: Glori Bickers  Total critical care time: 45 minutes  Critical  care time was exclusive of separately billable procedures and treating other patients.  Critical care was necessary to treat or prevent imminent or life-threatening deterioration.  Critical care was time spent personally by me (independent of midlevel providers or residents) on the following activities: development of treatment plan with patient and/or surrogate as well as nursing, discussions with consultants, evaluation of patient's response to treatment, examination of patient, obtaining history from patient or surrogate, ordering and performing treatments and interventions, ordering and review of laboratory studies, ordering and review of radiographic studies, pulse oximetry and re-evaluation of patient's condition.  Glori Bickers, MD  3:20 PM

## 2019-03-14 NOTE — Progress Notes (Signed)
Chaplain engaged in follow-up visit with [name] [name].  Chaplain will continue to follow-up.  Chaplain offered continued support.

## 2019-03-14 NOTE — Progress Notes (Signed)
ANTICOAGULATION CONSULT NOTE - [place] for warfarin Indication: atrial fibrillation/LVAD  Allergies  Allergen Reactions  . Penicillins Hives, Itching and Other (See Comments)    Did it involve swelling of the face/tongue/throat, SOB, or low BP? No Did it involve sudden or severe rash/hives, skin peeling, or any reaction on the inside of your mouth or nose? No Did you need to seek medical attention at a hospital or doctor's office? Unknown When did it last happen?childhood allergy If all above answers are "NO", may proceed with cephalosporin use.    Marland Kitchen Alcohol-Sulfur [Sulfur] Other (See Comments)    Burns skin    Patient Measurements: Height: 5\' 10"  (177.8 cm) Weight: 250 lb 7.1 oz (113.6 kg) IBW/kg (Calculated) : 73  Vital Signs: Temp: 97.5 F (36.4 C) (01/12 0800) Temp Source: Axillary (01/12 0800) BP: 81/68 (01/12 0800) Pulse Rate: 71 (01/12 0839)  Labs: Recent Labs    03/12/19 0500 03/13/19 0238 03/13/19 0356 03/13/19 0454 03/13/19 1638 03/27/2019 0332 03/20/2019 0537  HGB 14.3  --   --  11.9* 14.3 11.5* 13.3  HCT 42.0  --   --  37.5* 42.0 36.9* 39.0  PLT  --   --   --  108*  --  83*  --   LABPROT 67.8* 58.4*  --   --   --  63.5*  --   INR 8.1* 6.7*  --   --   --  7.4*  --   CREATININE 2.42*  --  2.16*  --   --  2.79*  --     Estimated Creatinine Clearance: 31.5 mL/min (A) (by C-G formula based on SCr of 2.79 mg/dL (H)).   Medical History: Past Medical History:  Diagnosis Date  . AICD (automatic cardioverter/defibrillator) present 02/05/2014   Upgrade to Medtronic biventricular ICD, serial number  [id number]   . Atrial flutter ([place]) 04/2012   s/p TEE-EPS+RFCA 04/2012  . CAD (coronary artery disease) [id number] X 2    RCA-T, 70% PL (off CFX), 99% Prox LAD/90% Dist LAD, S/P TAXUS stent x 2  . CHF (congestive heart failure) ([place])   . Chronic anticoagulation   . Chronic systolic heart failure ([place])   . CKD (chronic kidney  disease)   . Diabetic retinopathy ([place])   . DM type 2 (diabetes mellitus, type 2) (H[facility])    insulin dependent  . HTN (hypertension)   . Hypercholesteremia    ablation  . ICD (implantable cardiac defibrillator) in place   . Ischemic cardiomyopathy March 2015   20-25% 2D   . Nephrolithiasis   . 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   INR remains elevated, LDH trending up, H/H low, DIC panel pending.  *PTA Warfarin dose= 2.5mg  daily  Goal of Therapy:  INR 2.0-2.5 Monitor platelets by anticoagulation protocol: Yes   Plan:  -Hold warfarin -Daily INR   Arrie Senate, PharmD, BCPS Clinical Pharmacist [phone] Please check AMION for all [facility] Pharmacy numbers 03/06/2019

## 2019-03-14 NOTE — Progress Notes (Signed)
Initial Nutrition Assessment  DOCUMENTATION CODES:   Not applicable  INTERVENTION:   Add b complex with Vitamin C if started on CRRT  Tube feeding:  -Vital AF 1.2 @ 60 ml/hr via OGT -60 ml Prostat BID  Provides: 2128 kcals, 168 grams protein, 2,369 mg potassium, 1168 ml free water.   NUTRITION DIAGNOSIS:   Increased nutrient needs related to acute illness as evidenced by estimated needs.  GOAL:   Patient will meet greater than or equal to 90% of their needs  MONITOR:   Skin, Vent status, Diet advancement, TF tolerance, Weight trends, Labs, I & O's  REASON FOR ASSESSMENT:   Ventilator    ASSESSMENT:   Patient with PMH significant for AICD, CAD s/p PCI, CKD III, [name], diabetic retinopathy, DM, HTN, ischemic cardiomyopathy, and CHF s/p LVAD. Presents this admission with bilateral pulmonary infiltrates and suspicion of amiodarone toxicity.   1/11- worsening respiratory failure, intubated   RD working remotely.  Requiring pressors. Cr worsening, K elevated, likely to start CRRT. Eating well prior to intubation (84% average for last eight meals). Will discuss feeding with CCM.   Weight shows to be stable over the last six months (fluctuates throughout the year). Will utilize 107.3 kg as EDW.   Patient is currently intubated on ventilator support MV: 12.6 L/min Temp (24hrs), Avg:97.4 F (36.3 C), Min:97.1 F (36.2 C), Max:97.7 F (36.5 C)   I/O: -6,224 ml since admit  UOP: 900 ml x 24 hrs   Drips: levophed, vasopressin  Medications: SS novolog, prednisone Labs: K 6.2 (H) Cr 2.79-trending up Mg 3.0 (H) LFTs elevated CBG 166-189   Diet Order:   Diet Order            Diet NPO time specified Except for: Sips with Meds  Diet effective midnight              EDUCATION NEEDS:   Not appropriate for education at this time  Skin:  Skin Assessment: Reviewed RN Assessment  Last BM:  1/11  Height:   Ht Readings from Last 1 Encounters:  03/05/19  5\' 10"  (1.778 m)    Weight:   Wt Readings from Last 1 Encounters:  03/22/2019 113.6 kg    Ideal Body Weight:  75.5 kg  BMI:  Body mass index is 35.93 kg/m.  Estimated Nutritional Needs:   Kcal:  2125 kcal  Protein:  151-189 grams  Fluid:  >/= 1.5 L/day   Mariana Single RD, LDN Clinical Nutrition Pager # - [phone]

## 2019-03-15 ENCOUNTER — Inpatient Hospital Stay (HOSPITAL_COMMUNITY): Payer: PPO

## 2019-03-15 LAB — POCT I-STAT 7, (LYTES, BLD GAS, ICA,H+H)
Acid-Base Excess: 1 mmol/L (ref 0.0–2.0)
Acid-base deficit: 1 mmol/L (ref 0.0–2.0)
Bicarbonate: 27 mmol/L (ref 20.0–28.0)
Bicarbonate: 24.7 mmol/L (ref 20.0–28.0)
Calcium, Ion: 1.24 mmol/L (ref 1.15–1.40)
Calcium, Ion: 1.17 mmol/L (ref 1.15–1.40)
HCT: 39 % (ref 39.0–52.0)
HCT: 40 % (ref 39.0–52.0)
Hemoglobin: 13.3 g/dL (ref 13.0–17.0)
Hemoglobin: 13.6 g/dL (ref 13.0–17.0)
O2 Saturation: 93 %
O2 Saturation: 72 %
Patient temperature: 98.2
Patient temperature: 98.1
Potassium: 5 mmol/L (ref 3.5–5.1)
Potassium: 4.6 mmol/L (ref 3.5–5.1)
Sodium: 148 mmol/L — ABNORMAL HIGH (ref 135–145)
Sodium: 148 mmol/L — ABNORMAL HIGH (ref 135–145)
TCO2: 28 mmol/L (ref 22–32)
TCO2: 26 mmol/L (ref 22–32)
pCO2 arterial: 45.5 mmHg (ref 32.0–48.0)
pCO2 arterial: 41.8 mmHg (ref 32.0–48.0)
pH, Arterial: 7.38 (ref 7.350–7.450)
pH, Arterial: 7.377 (ref 7.350–7.450)
pO2, Arterial: 68 mmHg — ABNORMAL LOW (ref 83.0–108.0)
pO2, Arterial: 38 mmHg — CL (ref 83.0–108.0)

## 2019-03-15 LAB — ACID FAST SMEAR (AFB, MYCOBACTERIA): Acid Fast Smear: NEGATIVE

## 2019-03-15 LAB — HYPERSENSITIVITY PNEUMONITIS
A. Pullulans Abs: NEGATIVE
A.Fumigatus #1 Abs: NEGATIVE
Micropolyspora faeni, IgG: NEGATIVE
Pigeon Serum Abs: NEGATIVE
Thermoact. Saccharii: NEGATIVE
Thermoactinomyces vulgaris, IgG: NEGATIVE

## 2019-03-15 LAB — RESPIRATORY PANEL BY PCR

## 2019-03-15 LAB — CBC
HCT: 37.3 % — ABNORMAL LOW (ref 39.0–52.0)
Hemoglobin: 11.7 g/dL — ABNORMAL LOW (ref 13.0–17.0)
MCH: 25.4 pg — ABNORMAL LOW (ref 26.0–34.0)
MCHC: 31.4 g/dL (ref 30.0–36.0)
MCV: 81.1 fL (ref 80.0–100.0)
Platelets: 68 10*3/uL — ABNORMAL LOW (ref 150–400)
RBC: 4.6 MIL/uL (ref 4.22–5.81)
RDW: 19.3 % — ABNORMAL HIGH (ref 11.5–15.5)
WBC: 20.2 10*3/uL — ABNORMAL HIGH (ref 4.0–10.5)
nRBC: 0.4 % — ABNORMAL HIGH (ref 0.0–0.2)

## 2019-03-15 LAB — COMPREHENSIVE METABOLIC PANEL
ALT: 276 U/L — ABNORMAL HIGH (ref 0–44)
AST: 288 U/L — ABNORMAL HIGH (ref 15–41)
Albumin: 1.8 g/dL — ABNORMAL LOW (ref 3.5–5.0)
Alkaline Phosphatase: 93 U/L (ref 38–126)
Anion gap: 10 (ref 5–15)
BUN: 117 mg/dL — ABNORMAL HIGH (ref 8–23)
CO2: 26 mmol/L (ref 22–32)
Calcium: 8.8 mg/dL — ABNORMAL LOW (ref 8.9–10.3)
Chloride: 109 mmol/L (ref 98–111)
Creatinine, Ser: 2.4 mg/dL — ABNORMAL HIGH (ref 0.61–1.24)
GFR calc Af Amer: 31 mL/min — ABNORMAL LOW (ref >60)
GFR calc non Af Amer: 27 mL/min — ABNORMAL LOW (ref >60)
Glucose, Bld: 217 mg/dL — ABNORMAL HIGH (ref 70–99)
Potassium: 5.2 mmol/L — ABNORMAL HIGH (ref 3.5–5.1)
Sodium: 145 mmol/L (ref 135–145)
Total Bilirubin: 2.7 mg/dL — ABNORMAL HIGH (ref 0.3–1.2)
Total Protein: 6.8 g/dL (ref 6.5–8.1)

## 2019-03-15 LAB — GLUCOSE, CAPILLARY
Glucose-Capillary: 185 mg/dL — ABNORMAL HIGH (ref 70–99)
Glucose-Capillary: 198 mg/dL — ABNORMAL HIGH (ref 70–99)
Glucose-Capillary: 200 mg/dL — ABNORMAL HIGH (ref 70–99)
Glucose-Capillary: 204 mg/dL — ABNORMAL HIGH (ref 70–99)
Glucose-Capillary: 251 mg/dL — ABNORMAL HIGH (ref 70–99)
Glucose-Capillary: 267 mg/dL — ABNORMAL HIGH (ref 70–99)

## 2019-03-15 LAB — HEPATITIS PANEL, ACUTE
HCV Ab: NONREACTIVE
Hep A IgM: NONREACTIVE
Hep B C IgM: NONREACTIVE
Hepatitis B Surface Ag: NONREACTIVE

## 2019-03-15 LAB — HAPTOGLOBIN: Haptoglobin: 148 mg/dL (ref 32–363)

## 2019-03-15 LAB — PROCALCITONIN: Procalcitonin: 2.42 ng/mL

## 2019-03-15 LAB — MAGNESIUM: Magnesium: 3.1 mg/dL — ABNORMAL HIGH (ref 1.7–2.4)

## 2019-03-15 LAB — LACTIC ACID, PLASMA: Lactic Acid, Venous: 3.1 mmol/L (ref 0.5–1.9)

## 2019-03-15 LAB — PHOSPHORUS
Phosphorus: 4.8 mg/dL — ABNORMAL HIGH (ref 2.5–4.6)
Phosphorus: 5 mg/dL — ABNORMAL HIGH (ref 2.5–4.6)

## 2019-03-15 LAB — PROTIME-INR
INR: 7.4 (ref 0.8–1.2)
Prothrombin Time: 63 seconds — ABNORMAL HIGH (ref 11.4–15.2)

## 2019-03-15 LAB — LACTATE DEHYDROGENASE: LDH: 578 U/L — ABNORMAL HIGH (ref 98–192)

## 2019-03-15 MED ORDER — SODIUM CHLORIDE 0.9% IV SOLUTION: Freq: Once | INTRAVENOUS | Status: AC

## 2019-03-15 MED ORDER — PHENYLEPHRINE HCL-NACL 10-0.9 MG/250ML-% IV SOLN: 0.0000 ug/min | INTRAVENOUS | Status: DC

## 2019-03-15 MED ORDER — PRO-STAT SUGAR FREE PO LIQD: 60.0000 mL | Freq: Two times a day (BID) | ORAL | Status: DC

## 2019-03-15 MED ORDER — DOPAMINE-DEXTROSE 3.2-5 MG/ML-% IV SOLN
5.0000 ug/kg/min | INTRAVENOUS | Status: DC
  Administered 2019-03-15: 2.5 ug/kg/min via INTRAVENOUS
  Administered 2019-03-16: 3.5 ug/kg/min via INTRAVENOUS
  Filled 2019-03-15 (×2): qty 250

## 2019-03-15 MED ORDER — VITAL AF 1.2 CAL PO LIQD
1000.0000 mL | ORAL | Status: DC
  Administered 2019-03-15: 12:00:00 1000 mL

## 2019-03-15 MED ORDER — LACTATED RINGERS IV BOLUS: 500.0000 mL | Freq: Once | INTRAVENOUS | Status: DC

## 2019-03-15 MED ORDER — PRO-STAT SUGAR FREE PO LIQD
30.0000 mL | Freq: Three times a day (TID) | ORAL | Status: DC
  Administered 2019-03-15 – 2019-03-17 (×6): 30 mL
  Filled 2019-03-15 (×6): qty 30

## 2019-03-15 MED ORDER — VITAL AF 1.2 CAL PO LIQD
1000.0000 mL | ORAL | Status: DC
  Administered 2019-03-16 (×2): 1000 mL

## 2019-03-15 NOTE — Telephone Encounter (Signed)
Pt is still currently admitted in hospital. I have gone ahead and scheduled pt a hospital follow up with MR Thursday, 2/4 at 10am so once pt is discharged from the hospital this will show up on pt's discharge AVS from hospital.. This appt can be changed if pt is still in hospital come appt. Nothing further needed.

## 2019-03-15 NOTE — Progress Notes (Addendum)
Patient heart rate in the 30s three times. CCM and VAD coordinator called . Dopamine started at 2.48mcg, ABG obtained  per verbal order from Dr. [name] via [place] coordinator. ABG results called in to CCM. No changes to vent settings at this time. Patient had no flow alarms or PI events with change in heart rate. Event communicated with VAD coordinator. PT/INR and Platelet  Lab (See results) communicated to VAD coordinator and 1 unit of FFP ordered per verbal order from Dr. [name]. Will continue to monitor patient. Wife updated at bedside.

## 2019-03-15 NOTE — Progress Notes (Signed)
[facility] Progress Note Patient Name: [name] [name] DOB: Nov 13, 1950 MRN: [id number]   Date of Service  03/15/2019  HPI/Events of Note  PT dropped his saturation and ABG shows drop in PO2 from 68 to 35, etiology unclear but secretions are not suggestive of pulmonary edema and he has bilateral breath sounds.  eICU Interventions  PEEP increased to 10, stat portable CXR ordered, Dr. Vaughan Browner informed to follow up.        [name] 03/15/2019, 7:08 AM

## 2019-03-15 NOTE — Progress Notes (Signed)
[facility] Progress Note Patient Name: [name] [name] DOB: Feb 02, 1951 MRN: [id number]   Date of Service  03/15/2019  HPI/Events of Note  Severe bradycardia  eICU Interventions  Dopamine ordered at 5 mcg/kg/min        Tanay Misuraca U Jynesis Nakamura 03/15/2019, 5:12 AM

## 2019-03-15 NOTE — Progress Notes (Signed)
ANTICOAGULATION CONSULT NOTE - [place] for warfarin Indication: atrial fibrillation/LVAD  Allergies  Allergen Reactions  . Penicillins Hives, Itching and Other (See Comments)    Did it involve swelling of the face/tongue/throat, SOB, or low BP? No Did it involve sudden or severe rash/hives, skin peeling, or any reaction on the inside of your mouth or nose? No Did you need to seek medical attention at a hospital or doctor's office? Unknown When did it last happen?childhood allergy If all above answers are "NO", may proceed with cephalosporin use.    Marland Kitchen Alcohol-Sulfur [Sulfur] Other (See Comments)    Burns skin    Patient Measurements: Height: 5\' 10"  (177.8 cm) Weight: 250 lb 7.1 oz (113.6 kg) IBW/kg (Calculated) : 73  Vital Signs: Temp: 98.9 F (37.2 C) (01/13 0700) Temp Source: Oral (01/13 0700) BP: 80/65 (01/13 0819) Pulse Rate: 89 (01/13 0819)  Labs: Recent Labs    03/13/19 0356 03/13/19 0449 03/05/2019 0332 04/02/2019 0721 03/15/19 0213 03/15/19 0516 03/15/19 0657  HGB  --   --  11.5*  --  11.7* 13.3 13.6  HCT  --   --  36.9*  --  37.3* 39.0 40.0  PLT  --    < > 83* 80* 68*  --   --   APTT  --   --   --  48*  --   --   --   LABPROT  --   --  63.5* 60.1* 63.0*  --   --   INR  --   --  7.4* 6.9* 7.4*  --   --   CREATININE 2.16*  --  2.79*  --  2.40*  --   --    < > = values in this interval not displayed.    Estimated Creatinine Clearance: 36.7 mL/min (A) (by C-G formula based on SCr of 2.4 mg/dL (H)).   Medical History: Past Medical History:  Diagnosis Date  . AICD (automatic cardioverter/defibrillator) present 02/05/2014   Upgrade to [employer] biventricular ICD, serial number  [id number]   . Atrial flutter ([place]) 04/2012   s/p TEE-EPS+RFCA 04/2012  . CAD (coronary artery disease) [id number] X 2    RCA-T, 70% PL (off CFX), 99% Prox LAD/90% Dist LAD, S/P TAXUS stent x 2  . CHF (congestive heart failure) ([place])   . Chronic  anticoagulation   . Chronic systolic heart failure ([place])   . CKD (chronic kidney disease)   . Diabetic retinopathy ([place])   . DM type 2 (diabetes mellitus, type 2) (H[facility])    insulin dependent  . HTN (hypertension)   . Hypercholesteremia    ablation  . ICD (implantable cardiac defibrillator) in place   . Ischemic cardiomyopathy March 2015   20-25% 2D   . Nephrolithiasis   . 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   INR remains elevated, LDH now trending down, H/H low, DIC panel negative.  *PTA Warfarin dose= 2.5mg  daily  Goal of Therapy:  INR 2.0-2.5 Monitor platelets by anticoagulation protocol: Yes   Plan:  -Hold warfarin -Daily INR   Arrie Senate, PharmD, BCPS Clinical Pharmacist [phone] Please check AMION for all [facility] numbers 03/15/2019

## 2019-03-15 NOTE — Progress Notes (Addendum)
Patient ID: YECHEZKEL FERTIG, male   DOB: 12/21/1950, 69 y.o.   MRN: [id number]   Advanced Heart Failure VAD Team Note  PCP-Cardiologist: No primary care provider on file.   Subjective:    Worsening MSOF. Required intubation 1/11. FiO2 90% . S/p bronchoscopy 1/12 for diagnostic eval of airways + specimen collection for culture. NGTD     LDH coming down, 493 -> 468 -> 555 -> 575--> 918 --> 578 today. DIC w/u negative (fibrinogen normal range, 436) . However platelets resulted and extremely low, now at 68K. INR 7.4. Hgb down from 13>>11.   On Vasopressin 0.3 for BP support. MAPs in the 80s. Dopamine started this am for bradycardia, 2.5 mcg.   Lasix has been held with low VAD flows and rising creatinine. Got IVFs 1/10. Now off. SCr improving, 2.79>>2.40.   LVAD INTERROGATION:  HeartMate 3 LVAD:   Flow 4.1 liters/min, speed 5100, power 3.4 PI 5.6.  VAD interrogated personally. Parameters stable.  Objective:    Vital Signs:   Temp:  [97.6 F (36.4 C)-98.9 F (37.2 C)] 98.9 F (37.2 C) (01/13 0700) Pulse Rate:  [29-89] 89 (01/13 0819) Resp:  [18-37] 24 (01/13 0819) BP: (78-103)/(63-88) 80/65 (01/13 0819) SpO2:  [88 %-99 %] 99 % (01/13 0819) Arterial Line BP: (75-114)/(55-90) 113/90 (01/13 0604) FiO2 (%):  [90 %-100 %] 100 % (01/13 0819) Last BM Date: 03/26/2019 Mean arterial Pressure 70s on VP   Intake/Output:   Intake/Output Summary (Last 24 hours) at 03/15/2019 1022 Last data filed at 03/15/2019 0655 Gross per 24 hour  Intake 1187.32 ml  Output 2150 ml  Net -962.68 ml     Physical Exam   Physical Exam: General:  Critically ill [name], intubated and sedated  HEENT: normal Neck: supple. no JVD. Carotids 2+ bilat; no bruits. No lymphadenopathy or thyromegaly appreciated. Cor: LVAD Hum. Lungs: intubated. course BS bilaterally Abdomen: obese, soft, nontender, nondistended. No hepatosplenomegaly. No bruits or masses. Good bowel sounds.  Driveline site stable. No drainage or  erythema  Extremities: no cyanosis, clubbing, rash, 1+ bilateral LEE edema Neuro: intubated and sedated   Telemetry   AF 70s Personally reviewed   Labs   Basic Metabolic Panel: Recent Labs  Lab 03/11/19 0035 03/12/19 0500 03/12/19 0500 03/13/19 0238 03/13/19 0356 03/11/2019 0332 03/22/2019 0537 03/15/19 0213 03/15/19 0516 03/15/19 0657  NA 138 135  138   < >  --  140 140 142 145 148* 148*  K 4.7 4.8  4.8   < >  --  4.4 4.8 6.2* 5.2* 5.0 4.6  CL 103 101  --   --  103 104  --  109  --   --   CO2 22 21*  --   --  24 24  --  26  --   --   GLUCOSE 204* 269*  --   --  195* 194*  --  217*  --   --   BUN 89* 107*  --   --  107* 122*  --  117*  --   --   CREATININE 2.02* 2.42*  --   --  2.16* 2.79*  --  2.40*  --   --   CALCIUM 8.6* 8.7*  --   --  8.8* 8.5*  --  8.8*  --   --   MG 2.9* 2.9*  --  3.0*  --  3.0*  --  3.1*  --   --   PHOS 4.3  --   --   --   --   --   --   --   --   --    < > =  values in this interval not displayed.    Liver Function Tests: Recent Labs  Lab 03/11/19 0035 03/12/19 0500 03/13/19 0356 03/15/2019 0332 03/15/19 0213  AST 76* 104* 141* 490* 288*  ALT 127* 132* 161* 277* 276*  ALKPHOS 100 93 100 87 93  BILITOT 2.0* 1.9* 1.6* 2.4* 2.7*  PROT 7.6 7.1 6.6 6.5 6.8  ALBUMIN 1.9* 1.8* 2.0* 1.8* 1.8*   No results for input(s): LIPASE, AMYLASE in the last 168 hours. Recent Labs  Lab 03/10/19 0316  AMMONIA 24    CBC: Recent Labs  Lab 03/10/19 0316 03/11/19 0035 03/13/19 0454 03/06/2019 0332 03/07/2019 0537 03/16/2019 0721 03/15/19 0213 03/15/19 0516 03/15/19 0657  WBC 28.5* 26.4* 17.9* 21.3*  --   --  20.2*  --   --   NEUTROABS  --  24.9*  --   --   --   --   --   --   --   HGB 12.0* 12.3* 11.9* 11.5* 13.3  --  11.7* 13.3 13.6  HCT 37.6* 37.5* 37.5* 36.9* 39.0  --  37.3* 39.0 40.0  MCV 80.0 79.8* 81.0 81.1  --   --  81.1  --   --   PLT 236 213 108* 83*  --  80* 68*  --   --     INR: Recent Labs  Lab 03/12/19 0500 03/13/19 0238  03/19/2019 0332 03/15/2019 0721 03/15/19 0213  INR 8.1* 6.7* 7.4* 6.9* 7.4*    Other results:     Imaging   DG Chest 1 View  Result Date: 03/15/2019 CLINICAL DATA:  Ventilator barotrauma. Negative COVID test 02/16/2019. EXAM: CHEST  1 VIEW COMPARISON:  03/15/2019 at 0540 hours, 03/23/2019, 02/07/2019 and CT chest 02/08/2019. FINDINGS: Endotracheal tube terminates 3.5 cm above the carina. Nasogastric tube is followed into the stomach with the tip projecting beyond the inferior margin of the image. Right PICC terminates at the SVC RA junction. Pacemaker/ICD lead tips and left ventricular assist device are stable in position. Heart is enlarged, stable. Diffuse coarsened and hazy appearance of the lungs bilaterally, unchanged from examination performed earlier the same day but possibly slightly progressive from 03/30/2019. A rounded area of lucency projects over the heart at the base of the left hemithorax, stable from recent prior exams but appears new from 02/19/2019. No definite pleural fluid although the left costophrenic angle is obscured by the LVAD. IMPRESSION: 1. 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 Pneumonia and acute respiratory distress syndrome are not excluded. Findings are similar to exam performed earlier the same day but appear slightly progressive from yesterday. 2. Rounded lucency at the base of the left hemithorax, stable from recent prior exams but new from 02/26/2019. If further evaluation is desired, CT chest without contrast could be performed. Electronically Signed   By: Lorin Picket M.D.   On: 03/15/2019 08:18   DG Chest Port (Stat)  Result Date: 03/15/2019 CLINICAL DATA:  Status update EXAM: PORTABLE CHEST 1 VIEW COMPARISON:  Yesterday FINDINGS: Endotracheal tube tip at the clavicular heads. The enteric tube that at least reaches the stomach. Biventricular pacer leads from the left and LVAD in  stable position. Right PICC with tip at the upper cavoatrial junction. Diffuse interstitial coarsening with low lung volumes. Stable cardiomegaly. No evidence of effusion or pneumothorax. IMPRESSION: Stable hardware positioning and pulmonary opacification. Electronically Signed   By: Monte Fantasia M.D.   On: 03/15/2019 08:37   DG CHEST PORT 1 VIEW  Result Date: 03/13/2019 CLINICAL DATA:  Status post bronchoscopy. EXAM: PORTABLE CHEST 1 VIEW COMPARISON:  Same day. FINDINGS: Stable cardiomediastinal silhouette. Endotracheal and nasogastric tubes are unchanged in position. Left ventricular assist device is unchanged. No pneumothorax is noted. Left-sided pacemaker is unchanged. Stable mild bilateral lung opacities are noted concerning for edema or possibly inflammation. Bony thorax is unremarkable. IMPRESSION: Stable support apparatus. Stable mild bilateral lung opacities are noted concerning for edema or possibly inflammation. No pneumothorax is noted. Electronically Signed   By: Marijo Conception M.D.   On: 03/12/2019 14:56   DG CHEST PORT 1 VIEW  Result Date: 03/12/2019 CLINICAL DATA:  Endotracheal tube position EXAM: PORTABLE CHEST 1 VIEW COMPARISON:  03/13/2019 FINDINGS: Endotracheal tube 2.5 cm above the carina.  NG tube in the stomach. LVAD unchanged in position.  AICD unchanged in position. Diffuse bilateral airspace disease with mild interval improvement. Left lower lobe atelectasis/infiltrate unchanged. No pneumothorax. IMPRESSION: Support lines in good position. Diffuse bilateral airspace disease with mild interval improvement. Electronically Signed   By: Franchot Gallo M.D.   On: 03/24/2019 09:40   DG CHEST PORT 1 VIEW  Result Date: 03/13/2019 CLINICAL DATA:  Post intubation and enteric tube placement. EXAM: PORTABLE CHEST 1 VIEW COMPARISON:  Chest x-ray from same day at 0609 hours. FINDINGS: Interval placement of an endotracheal tube with the tip 1.5 cm above the carina. New enteric tube within  the stomach. Unchanged left chest wall pacemaker. Unchanged cardiomegaly and LVAD. Hazy granular interstitial and airspace opacities throughout both lungs, similar to prior study. No pneumothorax or large pleural effusion. No acute osseous abnormality. IMPRESSION: 1. Appropriately positioned endotracheal and enteric tubes. 2. Unchanged diffuse bilateral interstitial and airspace disease. Electronically Signed   By: Titus Dubin M.D.   On: 03/13/2019 10:39   DG Chest Port 1V same Day  Result Date: 03/27/2019 CLINICAL DATA:  69 year old male status post PICC placement. EXAM: PORTABLE CHEST 1 VIEW COMPARISON:  Earlier radiograph dated 03/13/2019. FINDINGS: Interval placement of a right-sided PICC with tip over central SVC likely close to the cavoatrial junction. The tip of the PICC is suboptimally visualized due to patient's positioning and overlying support wires. Additional support apparatus in similar position. No interval change in the appearance of the lungs or cardiomediastinal silhouette since the earlier radiograph. IMPRESSION: Right-sided PICC with tip likely close to the cavoatrial junction. No other interval change. Electronically Signed   By: Anner Crete M.D.   On: 03/31/2019 16:49   [place] EKG SITE RITE  Result Date: 03/13/2019 If Site Rite image not attached, placement could not be confirmed due to current cardiac rhythm.  US Abdomen Limited RUQ  Result Date: 03/03/2019 CLINICAL DATA:  69 year old male with elevated LFTs. EXAM: ULTRASOUND ABDOMEN LIMITED RIGHT UPPER QUADRANT COMPARISON:  None. FINDINGS: Gallbladder: There is sludge and small stones within the gallbladder. There is no gallbladder wall thickening or pericholecystic fluid. Evaluation for Murphy's sign is limited as the patient was unresponsive. Common bile duct: Diameter: 3 mm Liver: There is diffuse increased liver echogenicity most commonly seen in the setting of fatty infiltration. Superimposed inflammation or fibrosis is  not excluded. Clinical correlation is recommended. Portal vein is patent on color Doppler imaging with normal direction of blood flow towards the liver. Other: There is trace upper abdominal ascites. IMPRESSION: 1. Gallbladder sludge and small stones. No definite stone graphic findings of acute cholecystitis. 2. Fatty liver. Electronically Signed   By: Anner Crete M.D.   On: 03/10/2019 15:20  Medications:     Scheduled Medications: . sodium chloride   Intravenous Once  . aspirin  81 mg Per Tube Daily  . chlorhexidine gluconate (MEDLINE KIT)  15 mL Mouth Rinse BID  . Chlorhexidine Gluconate Cloth  6 each Topical Daily  . feeding supplement (PRO-STAT SUGAR FREE 64)  60 mL Per Tube BID  . insulin aspart  0-15 Units Subcutaneous Q4H  . mouth rinse  15 mL Mouth Rinse 10 times per day  . pantoprazole sodium  40 mg Per Tube Daily  . predniSONE  60 mg Per Tube Q breakfast  . sodium chloride flush  10-40 mL Intracatheter Q12H  . sodium chloride flush  10-40 mL Intracatheter Q12H  . sodium chloride flush  3 mL Intravenous Q12H  . Warfarin - Pharmacist Dosing Inpatient   Does not apply q1800    Infusions: . sodium chloride 120 mL/hr at 03/15/19 0600  . sodium chloride    . DOPamine 2.5 mcg/kg/min (03/15/19 0600)  . fentaNYL infusion INTRAVENOUS 25 mcg/hr (03/15/19 0600)  . meropenem (MERREM) IV Stopped (03/15/19 0407)  . norepinephrine (LEVOPHED) Adult infusion Stopped (03/11/2019 0612)  . vancomycin Stopped (03/11/2019 1509)  . vasopressin (PITRESSIN) infusion - *FOR SHOCK* 0.03 Units/min (03/15/19 0600)    PRN Medications: sodium chloride, acetaminophen, fentaNYL (SUBLIMAZE) injection, fentaNYL (SUBLIMAZE) injection, midazolam, ondansetron (ZOFRAN) IV, sodium chloride flush, sodium chloride flush, sodium chloride flush   Assessment/Plan:    1. Acute hypoxemic respiratory failure: Recently seen in the ER and now admitted from clinic with marked hypoxemic requiring NRBM.  CXR with  diffuse bilateral interstitial and airspace disease.  12/27 ESR elevated to 80 => 68 this admission with CRP 23.  PCT elevated mildly 0.67 => 0.7.  Respiratory virus and COVID-19 negative.  Blood cultures NGTD.  Afebrile, WBCs 21K on steroids.  Also noted to be volume overloaded on exam with significant JVD, weight gain, and peripheral edema. High resolution CT chest with diffuse GGO that could be consistent with amiodarone toxicity versus infection.  Autoimmune panel negative.  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  Amiodarone has been stopped and he has been started on steroids.  - Decompensated on 1/7 moved to ICU. Worsening respiratory status and Intubated 11/11 - Initial PCT 0.7, increased to  5. Started vanc/meropenem for possible PNA on 1/8. CXR unchanged. Repeat PCT down to 2.4 today.  - S/p bronchoscopy 1/12 for diagnostic eval of airways + specimen collection for culture. NGTD  - He does have volume overload on exam but mostly RV failure. Do not think we can get much more from diuresis as left sided pressures typically decongested well with VAD. Plan RHC possibly to further evaluate +/- Swan placement in cath lab once INR drifts down.  - Continue serial ABGs  2. 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  MDT CRT-D device.  LVAD parameters stable on interrogation.  - Has been diuresed aggressively this admission.  Creatinine bumped to 2.4. IVFs given. SCr 2.4>2.7>>2.4. IVFs now off. Plan possible RHC  to better assess volume status once INR drifts down   - INR goal 2-2.5 with LVAD, continue ASA 81. Warfarin currently on hold for supra therapeutic INR. - INR today 4.8 -> 5.9 -> 8.1.-> 6.7->7.1->7.4 - LDH higher at 475 at admission but with acute inflammatory disease, suspect pump thrombus formation less likely. LDH better today 493 -> 468 -> 555 -> 575--> 918-->578. DIC panel w/u negative (fibrinogen normal  range, 436) -  On VP for BP support. MAPs in the 80s  3. Atrial fibrillation permanent:  - rate controlled. No change  4. 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 Frequent PVCs.  - On 1/4 started mexiletine 200 mg twice a day to suppress PVCs.  - Will stop all non-essential meds from now as clinical deterioration seems multifactorial  5. AKI on CKD stage 3:  - creatinine trending back down, 2.79>>2.4 - BUN  117. Suspect he is uremic as well.  - May ultimately need CVVHD  6. Diabetes: Blood glucose up with steroids.  - Continue SSI - Continue home 70/30.   7. Severe Malnutrition - Albumin 1.8. Consulted dietitian - po intake poor  8. Transaminitis/liver failure - possibly due to amio.  - amio stopped -  Ammonia level ok 1/8 (24)  Length of Stay: [id number] PA-C 03/15/2019, 10:22 AM  VAD Team --- VAD ISSUES ONLY--- Pager [phone] (7am - 7am)  Advanced Heart Failure Team  Pager [phone] (M-F; 7a - 4p)  Please contact [facility] for night-coverage after hours (4p -7a ) and weekends on [email or web address]  Agree with above.  Remains sedated on vent. Bronch yesterday was fairly unremarkable. RUQ with fatty liver. On Vasopressin and now on dopamine for RV support. Still with high FiO2 requirement. Lactate elevated at 3.1. All serology and infectious w/u negative so far. LDH, LFTs starting to trend down.   On exam General:  Intubated. Sedated  HEENT: normal + ETT Neck: supple. JVP to jaw  Carotids 2+ bilat; no bruits. No lymphadenopathy or thryomegaly appreciated. Cor: PMI nondisplaced. IRR No rubs, gallops or murmurs. Lungs: coarse Abdomen: obese soft, nontender, nondistended. No hepatosplenomegaly. No bruits or masses. Good bowel sounds. Extremities: no cyanosis, clubbing, rash, 1+ edema Neuro: intubated sedated  He remains critically ill. Admitted with amio lung toxicity and now with MSOF organ failure of unclear etiology.LFTs and LDH appear to be finally tredning the right  way. Will continue supportive care with VP and dopamine as well as broad spectrum abx. Discussed situation with family. Will give 48 more hours to assess improvement. If no improvement may decide to pull back. VAD interrogated personally. Parameters stable.  CRITICAL CARE Performed by: Glori Bickers  Total critical care time: 35 minutes  Critical care time was exclusive of separately billable procedures and treating other patients.  Critical care was necessary to treat or prevent imminent or life-threatening deterioration.  Critical care was time spent personally by me (independent of midlevel providers or residents) on the following activities: development of treatment plan with patient and/or surrogate as well as nursing, discussions with consultants, evaluation of patient's response to treatment, examination of patient, obtaining history from patient or surrogate, ordering and performing treatments and interventions, ordering and review of laboratory studies, ordering and review of radiographic studies, pulse oximetry and re-evaluation of patient's condition.  Glori Bickers, MD  3:05 PM

## 2019-03-15 NOTE — Progress Notes (Signed)
Nutrition Follow up  DOCUMENTATION CODES:   Not applicable  INTERVENTION:   Tube feeding:  -Vital AF 1.2 @ 65 ml/hr via OGT (1560 ml) -30 ml Prostat TID  Provides: 2172 kcals, 162grams protein, 1265 ml free water.   NUTRITION DIAGNOSIS:   Increased nutrient needs related to acute illness as evidenced by estimated needs.  Ongoing  GOAL:   Patient will meet greater than or equal to 90% of their needs   Addressed via TF  MONITOR:   Skin, Vent status, Diet advancement, TF tolerance, Weight trends, Labs, I & O's  REASON FOR ASSESSMENT:   Ventilator    ASSESSMENT:   Patient with PMH significant for AICD, CAD s/p PCI, CKD III, [name], diabetic retinopathy, DM, HTN, ischemic cardiomyopathy, and CHF s/p LVAD. Presents this admission with bilateral pulmonary infiltrates and suspicion of amiodarone toxicity.   1/11- worsening respiratory failure, intubated  1/12- s/p bronch   Pt discussed during ICU rounds and with RN.   Possible R heart cath once INR decreases. Lasix held today. Cr trending down, UOP okay. Hold off on CRRT. Remains on pressors. Potassium now wdl. Tolerating Prostat through tube. Spoke with CCM resident. Okay to start feeding.   Admission weight: 107.3 kg  Current weight:  113.6 kg   Patient remains intubated on ventilator support MV: 12.8 L/min Temp (24hrs), Avg:98.4 F (36.9 C), Min:97.6 F (36.4 C), Max:99.3 F (37.4 C)  I/O: -5,299 ml since 12/30  UOP: 2,150 ml x 24 hrs   Drips: dopamine, fentanyl, vasopressin  Medications: SS novolog, prednisone  Labs: K 5.2 (H) Mg 3.1 (H) Cr 2.4-trending back down LFTs trending back up  Diet Order:   Diet Order            Diet NPO time specified Except for: Sips with Meds  Diet effective midnight              EDUCATION NEEDS:   Not appropriate for education at this time  Skin:  Skin Assessment: Reviewed RN Assessment  Last BM:  1/12  Height:   Ht Readings from Last 1 Encounters:   03/05/19 5\' 10"  (1.778 m)    Weight:   Wt Readings from Last 1 Encounters:  04/02/2019 113.6 kg    Ideal Body Weight:  75.5 kg  BMI:  Body mass index is 35.93 kg/m.  Estimated Nutritional Needs:   Kcal:  2180 kcal  Protein:  151-189 grams  Fluid:  >/= 2.1 L/day   Mariana Single RD, LDN Clinical Nutrition Pager # - [phone]

## 2019-03-15 NOTE — Progress Notes (Signed)
CSW met at bedside with patient's wife to offer support. Wife shared recent decline and stated she has remained at bedside the past two nights. Wife reports her daughters have been very supportive and are feeling a bit overwhelmed with inability to join wife at bedside. CSW provided supportive intervention and will be available as needed. Raquel Sarna, [place], [place]

## 2019-03-15 NOTE — Progress Notes (Signed)
Patient SpO2 70s-80s. Repeat ABG called to Dr. Lucile Shutters. See results review for ABG results. FiO2 100 Peep increased from 5 to 10 and STAT chest XRAY ordered. Patient lungs sounds clear and diminished at this time. Will continue to monitor.

## 2019-03-15 NOTE — Progress Notes (Signed)
NAME:  [name] [name], MRN:  [id number], DOB:  1950-12-23, LOS: 28 ADMISSION DATE:  02/05/2019, CONSULTATION DATE:  02/27/2019 REFERRING MD:  Bensimhon, CHIEF COMPLAINT:  hypoxemia  Brief History   69 year old male with extensive cardiac history with LVAD and PCCM consulted for acute Hypoxemia.  History of present illness   69 yo M with extensive cardiac history with LVAD presenting with acute onset hypoxia possibly concerning for HF exacerbation vs amiodarone toxicity vs unknown process. COVID negative x 3. 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  Past Medical History  He has a past medical history of AICD (automatic cardioverter/defibrillator) present (02/05/2014), Atrial flutter ([place]) (04/2012), CAD (coronary artery disease) [id number] X 2 ), CHF (congestive heart failure) ([place]), Chronic anticoagulation, Chronic systolic heart failure ([place]), CKD (chronic kidney disease), Diabetic retinopathy ([place]), DM type 2 (diabetes mellitus, type 2) ([place]), HTN (hypertension), Hypercholesteremia, ICD (implantable cardiac defibrillator) in place, Ischemic cardiomyopathy (March 2015), Nephrolithiasis, and Ventricular tachycardia ([place]).  Significant Hospital Events   12/29 Admit to HF service 12/30 High RES CT shows diffuse patchy ground glass - see imaging 1/2 Improved oxygenation to 10L O2 1/4 on Bipap 1/9 HF [place], QHS Bipap 1/11 Intubation   Consults:  Heart failure  Procedures:  1/9 Art line 1/11 Intubation 1/12 Bronchoscopy   Significant Diagnostic Tests:  12/29 CT Chest High Resolution IMPRESSION: 1. Diffuse patchy pulmonary parenchymal ground-glass, scattered consolidation and suspected mild architectural distortion, with a slight upper/midlung zone predominance. Findings are new/progressive from 05/03/2018 and can be seen with amiodarone toxicity. Pneumonia is not excluded. 2. Trace bilateral pleural effusions. 3. Increased  prominence of mediastinal lymph nodes, likely reactive. 4. Liver margin may be slightly irregular, raising suspicion for cirrhosis. 5. Gallbladder sludge and/or stones. 6.  Aortic atherosclerosis (ICD10-I70.0). 7. Enlarged pulmonic trunk, indicative of pulmonary arterial  1/12 RUQ U/S IMPRESSION: 1. Gallbladder sludge and small stones. No definite stone graphic findings of acute cholecystitis. 2. Fatty liver.  Micro Data:  12/27 SARS2 >> neg 12/29 RVP >> Negative 12/29 SARS 2 >> Negative 12/29 MRSA PCR >> neg 1/12 Blood cultures - NGTD at 24hrs 1/13 Bronch alveolar lavage - No organism - few WBC  Antimicrobials:  Vancomycin 1/8 --> Meropenem 1/8 -->  Interim history/subjective:  [place] this morning, requiring increased PEEP and FIO2  Objective   Blood pressure (!) 84/67, pulse 89, temperature 98.9 F (37.2 C), temperature source Oral, resp. rate (!) 24, height 5\' 10"  (1.778 m), weight 113.6 kg, SpO2 99 %. CVP:  [15 mmHg-19 mmHg] 18 mmHg  Vent Mode: PRVC FiO2 (%):  [90 %-100 %] 100 % Set Rate:  [22 bmp] 22 bmp Vt Set:  [580 mL] 580 mL PEEP:  [5 cmH20-10 cmH20] 10 cmH20 Plateau Pressure:  [25 cmH20-31 cmH20] 31 cmH20   Intake/Output Summary (Last 24 hours) at 03/15/2019 [id number] Last data filed at 03/15/2019 [id number] Gross per 24 hour  Intake 1187.32 ml  Output 2150 ml  Net -962.68 ml   Filed Weights   03/11/19 0500 03/12/19 0446 03/21/2019 0400  Weight: 111.4 kg 113.1 kg 113.6 kg    Examination: General: NAD, intubated HENT: EOMI, sclera antiicteric Lungs: CTAB, on vent Cardiovascular: LVAD hum Abdomen: decreased bowel sounds, non- tender, non-distended Extremities: 1+ LEE, no deformities Neuro: moving extremities  Resolved Hospital Problem list     Assessment & Plan:  Acute hypoxemic respiratory failure: 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  Workup for infectious cause has been unrevealing. -  Worsening oxygenation, on vent. - Bronchospy on  1/12. No organisms seen.  P:   -continue prednisone 60 - F/u bronch results  - Continue Vanc , meropenem  Acute on chronic HFrEF:  Normal regimen MWF torsemide 20mg , off bp meds, LVAD functioning appropriately, volume status difficult to assess.  rec'd gentle IVF 1/10 for low VAD flows, worsening renal function and low bp.   P:  -per cardiology, they are considering doing RHC and assessing need for CVVHD  Elevated LFT's - RUQ with sludge,small stones, fatty liver, but neg for acute cholecystitis P: - follow labs  AKI on CKD IIIB: baseline cr around 1.5-2, Cr trend,  2.16 >2.42>2.79>2.4. Good urine output P:   Follow urine output, and trend labs  Best practice:  Diet: Tube feeds  Pain/Anxiety/Delirium protocol (if indicated): Fentanyl  VAP protocol (if indicated): Vap protocol  DVT prophylaxis: Warfarin GI prophylaxis: Protonix Glucose control: SSI Mobility: Up with assistance  Code Status: Partial  Family Communication: Wife bedside Disposition: ICU  Labs   CBC: Recent Labs  Lab 03/10/19 0316 03/11/19 0035 03/13/19 0454 03/12/2019 0332 03/18/2019 0537 03/24/2019 0721 03/15/19 0213 03/15/19 0516 03/15/19 0657  WBC 28.5* 26.4* 17.9* 21.3*  --   --  20.2*  --   --   NEUTROABS  --  24.9*  --   --   --   --   --   --   --   HGB 12.0* 12.3* 11.9* 11.5* 13.3  --  11.7* 13.3 13.6  HCT 37.6* 37.5* 37.5* 36.9* 39.0  --  37.3* 39.0 40.0  MCV 80.0 79.8* 81.0 81.1  --   --  81.1  --   --   PLT 236 213 108* 83*  --  80* 68*  --   --     Basic Metabolic Panel: Recent Labs  Lab 03/11/19 0035 03/12/19 0500 03/13/19 0238 03/13/19 0356 04/02/2019 0332 03/21/2019 0537 03/15/19 0213 03/15/19 0516 03/15/19 0657  NA 138 135  138  --  140 140 142 145 148* 148*  K 4.7 4.8  4.8  --  4.4 4.8 6.2* 5.2* 5.0 4.6  CL 103 101  --  103 104  --  109  --   --   CO2 22 21*  --  24 24  --  26  --   --   GLUCOSE 204* 269*  --  195* 194*  --  217*  --   --   BUN 89* 107*  --  107* 122*  --   117*  --   --   CREATININE 2.02* 2.42*  --  2.16* 2.79*  --  2.40*  --   --   CALCIUM 8.6* 8.7*  --  8.8* 8.5*  --  8.8*  --   --   MG 2.9* 2.9* 3.0*  --  3.0*  --  3.1*  --   --   PHOS 4.3  --   --   --   --   --   --   --   --    GFR: Estimated Creatinine Clearance: 36.7 mL/min (A) (by C-G formula based on SCr of 2.4 mg/dL (H)). Recent Labs  Lab 03/11/19 0035 03/12/19 0500 03/13/19 0454 03/25/2019 0332 03/10/2019 0935 04/01/2019 1027 03/15/19 0213  PROCALCITON 4.55 3.81  --   --  2.54  --  2.42  WBC 26.4*  --  17.9* 21.3*  --   --  20.2*  LATICACIDVEN  --   --   --   --   --  3.1*  --     Liver Function Tests: Recent Labs  Lab 03/11/19 0035 03/12/19 0500 03/13/19 0356 03/30/2019 0332 03/15/19 0213  AST 76* 104* 141* 490* 288*  ALT 127* 132* 161* 277* 276*  ALKPHOS 100 93 100 87 93  BILITOT 2.0* 1.9* 1.6* 2.4* 2.7*  PROT 7.6 7.1 6.6 6.5 6.8  ALBUMIN 1.9* 1.8* 2.0* 1.8* 1.8*   No results for input(s): LIPASE, AMYLASE in the last 168 hours. Recent Labs  Lab 03/10/19 0316  AMMONIA 24    ABG    Component Value Date/Time   PHART 7.377 03/15/2019 0657   PCO2ART 41.8 03/15/2019 0657   PO2ART 38.0 (LL) 03/15/2019 0657   HCO3 24.7 03/15/2019 0657   TCO2 26 03/15/2019 0657   ACIDBASEDEF 1.0 03/15/2019 0657   O2SAT 72.0 03/15/2019 0657     Coagulation Profile: Recent Labs  Lab 03/12/19 0500 03/13/19 0238 03/21/2019 0332 03/29/2019 0721 03/15/19 0213  INR 8.1* 6.7* 7.4* 6.9* 7.4*    Cardiac Enzymes: No results for input(s): CKTOTAL, CKMB, CKMBINDEX, TROPONINI in the last 168 hours.  HbA1C: Hemoglobin A1C  Date/Time Value Ref Range Status  02/10/2018 01:50 PM 8.9 (A) 4.0 - 5.6 % Final  11/11/2017 03:11 PM 6.3 (A) 4.0 - 5.6 % Final   Hgb A1c MFr Bld  Date/Time Value Ref Range Status  02/01/2019 11:20 AM 9.4 (H) 4.8 - 5.6 % Final    Comment:    (NOTE) Pre diabetes:          5.7%-6.4% Diabetes:              >6.4% Glycemic control for   <7.0% adults with  diabetes   08/14/2017 05:30 AM 8.4 (H) 4.8 - 5.6 % Final    Comment:    (NOTE) Pre diabetes:          5.7%-6.4% Diabetes:              >6.4% Glycemic control for   <7.0% adults with diabetes     CBG: Recent Labs  Lab 03/15/2019 1714 03/12/2019 1951 03/25/2019 2341 03/15/19 0324 03/15/19 0735  GLUCAP 159* 174* 195* 198* 251*    Review of Systems:   Unable to complete, patient intubated   Past Medical History  He,  has a past medical history of AICD (automatic cardioverter/defibrillator) present (02/05/2014), Atrial flutter ([place]) (04/2012), CAD (coronary artery disease) [id number] X 2 ), CHF (congestive heart failure) ([place]), Chronic anticoagulation, Chronic systolic heart failure ([place]), CKD (chronic kidney disease), Diabetic retinopathy ([place]), DM type 2 (diabetes mellitus, type 2) ([place]), HTN (hypertension), Hypercholesteremia, ICD (implantable cardiac defibrillator) in place, Ischemic cardiomyopathy (March 2015), Nephrolithiasis, and Ventricular tachycardia ([place]).   Surgical History    Past Surgical History:  Procedure Laterality Date  . ATRIAL FLUTTER ABLATION N/A 05/19/2012   Procedure: ATRIAL FLUTTER ABLATION;  Surgeon: Thompson Grayer, MD;  Location: [facility] CATH LAB;  Service: Cardiovascular;  Laterality: N/A;  . BI-VENTRICULAR IMPLANTABLE CARDIOVERTER DEFIBRILLATOR UPGRADE N/A 02/05/2014   Procedure: BI-VENTRICULAR IMPLANTABLE CARDIOVERTER DEFIBRILLATOR UPGRADE;  Surgeon: Evans Lance, MD;  Location: [facility] CATH LAB;  Service: Cardiovascular;  Laterality: N/A;  . BIV ICD GENERTAOR CHANGE OUT  02/05/2014   Upgrade to Medtronic biventricular ICD, serial number  BLD [id number] by Dr. [name]  . CARDIAC DEFIBRILLATOR PLACEMENT  2007    Medtronic Maximo VR, serial number [id number]  . INSERTION OF IMPLANTABLE LEFT VENTRICULAR ASSIST DEVICE N/A 08/16/2017   Procedure: INSERTION OF IMPLANTABLE LEFT VENTRICULAR ASSIST DEVICE-HM3;  Surgeon: Ivin Poot,  MD;  Location: [facility] OR;  Service: Open Heart  Surgery;  Laterality: N/A;  . IR FLUORO GUIDE CV LINE RIGHT  08/12/2017  . IR US GUIDE VASC ACCESS RIGHT  08/12/2017  . MEDIASTINAL EXPLORATION  08/17/2017   Procedure: MEDIASTINAL REXPLORATION with evacuation of hematoma;  Surgeon: Prescott Gum, Collier Salina, MD;  Location: [facility] OR;  Service: Open Heart Surgery;;  . PERCUTANEOUS CORONARY STENT INTERVENTION (PCI-S)  January 2002   PTCA/Stent Distal RCA  . PERCUTANEOUS CORONARY STENT INTERVENTION (PCI-S)  June 2002   PTCA/Stent x 3 RCA, thrombolysis - failed  . PERCUTANEOUS CORONARY STENT INTERVENTION (PCI-S)  July 2006   TAXUS stents to prox and distal LAD  . RIGHT HEART CATH N/A 07/22/2017   Procedure: RIGHT HEART CATH;  Surgeon: Jolaine Artist, MD;  Location: [place] CV LAB;  Service: Cardiovascular;  Laterality: N/A;  . RIGHT HEART CATH N/A 08/13/2017   Procedure: RIGHT HEART CATH - swan;  Surgeon: Jolaine Artist, MD;  Location: [place] CV LAB;  Service: Cardiovascular;  Laterality: N/A;  . RIGHT HEART CATH N/A 05/05/2018   Procedure: RIGHT HEART CATH;  Surgeon: Jolaine Artist, MD;  Location: [place] CV LAB;  Service: Cardiovascular;  Laterality: N/A;  . RIGHT/LEFT HEART CATH AND CORONARY ANGIOGRAPHY N/A 02/18/2017   Procedure: RIGHT/LEFT HEART CATH AND CORONARY ANGIOGRAPHY;  Surgeon: Jolaine Artist, MD;  Location: [place] CV LAB;  Service: Cardiovascular;  Laterality: N/A;  . STERNAL CLOSURE N/A 08/19/2017   Procedure: STERNAL CLOSURE;  Surgeon: Ivin Poot, MD;  Location: [place];  Service: Thoracic;  Laterality: N/A;  . TEE WITHOUT CARDIOVERSION N/A 08/16/2017   Procedure: TRANSESOPHAGEAL ECHOCARDIOGRAM (TEE);  Surgeon: Prescott Gum, Collier Salina, MD;  Location: [place];  Service: Open Heart Surgery;  Laterality: N/A;  . TEE WITHOUT CARDIOVERSION N/A 08/19/2017   Procedure: TRANSESOPHAGEAL ECHOCARDIOGRAM (TEE);  Surgeon: Prescott Gum, Collier Salina, MD;  Location: [place];  Service: Thoracic;  Laterality: N/A;  . TRICUSPID VALVE REPLACEMENT  N/A 08/16/2017   Procedure: TRICUSPID VALVE REPAIR using Oletta Lamas MC3 Ring size 30;  Surgeon: Ivin Poot, MD;  Location: [place];  Service: Open Heart Surgery;  Laterality: N/A;     Social History   reports that he quit smoking about 19 years ago. His smoking use included cigarettes. He has a 29.00 pack-year smoking history. He has never used smokeless tobacco. He reports that he does not drink alcohol or use drugs.   Family History   His family history includes Alzheimer's disease in his mother; CAD in his brother, brother, brother, and brother; Diabetes in his brother; Healthy in his daughter and son; Heart attack in his mother; Heart failure in his father; Other in his brother; Prostate cancer in his brother. There is no history of Stomach cancer or Colon cancer.   Allergies Allergies  Allergen Reactions  . Penicillins Hives, Itching and Other (See Comments)    Did it involve swelling of the face/tongue/throat, SOB, or low BP? No Did it involve sudden or severe rash/hives, skin peeling, or any reaction on the inside of your mouth or nose? No Did you need to seek medical attention at a hospital or doctor's office? Unknown When did it last happen?childhood allergy If all above answers are "NO", may proceed with cephalosporin use.    Marland Kitchen Alcohol-Sulfur [Sulfur] Other (See Comments)    Burns skin     Home Medications  Prior to Admission medications   Medication Sig Start Date End Date Taking? Authorizing  Provider  acetaminophen (TYLENOL) 500 MG tablet Take 1,000 mg by mouth every 6 (six) hours as needed for moderate pain or headache.    Yes [provider]  ascorbic acid (VITAMIN C) 500 MG tablet Take 1 tablet (500 mg total) by mouth 2 (two) times daily. 09/24/17  Yes Love, Ivan Anchors, PA-C  aspirin 81 MG chewable tablet Chew 1 tablet (81 mg total) by mouth daily. 09/10/17  Yes Clegg, Amy D, NP  gabapentin (NEURONTIN) 300 MG capsule Take 2 capsules (600 mg total) by mouth 2  (two) times daily. 02/09/19  Yes Bensimhon, Shaune Pascal, MD  insulin NPH-regular Human (70-30) 100 UNIT/ML injection Inject 40 Units into the skin daily.    Yes [provider]  losartan (COZAAR) 25 MG tablet Take 1 tablet (25 mg total) by mouth daily. 09/27/18  Yes Bensimhon, Shaune Pascal, MD  magnesium oxide (MAG-OX) 400 (241.3 Mg) MG tablet Take 1 tablet (400 mg total) by mouth 2 (two) times daily. 09/24/17  Yes Love, Ivan Anchors, PA-C  Multiple Vitamin (MULTIVITAMIN WITH MINERALS) TABS tablet Take 1 tablet by mouth daily. 09/25/17  Yes Love, Ivan Anchors, PA-C  pantoprazole (PROTONIX) 40 MG tablet Take 1 tablet (40 mg total) by mouth daily. 02/22/19  Yes Clegg, Amy D, NP  predniSONE (DELTASONE) 20 MG tablet Take 3 tablets (60 mg total) by mouth daily with breakfast for 7 days, THEN 2 tablets (40 mg total) daily with breakfast. 02/26/19 04/04/19 Yes Bensimhon, Shaune Pascal, MD  torsemide (DEMADEX) 20 MG tablet Take 1 tablet (20 mg total) by mouth every Monday, Wednesday, and Friday. 02/09/19  Yes Bensimhon, Shaune Pascal, MD  triamcinolone (NASACORT ALLERGY 24HR) 55 MCG/ACT AERO nasal inhaler Place 2 sprays into the nose daily.   Yes [provider]  warfarin (COUMADIN) 2.5 MG tablet Take on tablet daily or as directed Patient taking differently: Take 2.5 mg by mouth daily.  10/06/18  Yes Bensimhon, Shaune Pascal, MD  amiodarone (PACERONE) 200 MG tablet Take 100-200 mg by mouth See admin instructions. Take 1 tablet (200 mg totally) by mouth in the morning; take 0.5 tablet (100 mg totally) in the evening 12/25/18   [provider]  Insulin Pen Needle (COMFORT EZ PEN NEEDLES) 32G X 6 MM MISC 1 application by Does not apply route at bedtime. 09/24/17   Bary Leriche, PA-C     Critical care time:     Tamsen Snider, MD PGY1

## 2019-03-15 NOTE — Progress Notes (Addendum)
LVAD Coordinator Rounding Note:  Admitted 02/08/2019 per Dr. 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  HM III LVAD implanted on 08/16/17 by Dr. Prescott Gum  under Destination Therapy criteria.  Pt sedated and intubated. Wife at bedside. Plan of care discussed with Dr [name] and pt's wife at bedside. Overnight bedside RN reported heart rate dropping into 30s on 3 occassions. Marcia Medtronic rep to interrogate device today.     Vital signs: Temp: 98.9 HR: 97 Doppler Pressure: not done Art BP: 102/85 (94) O2 Sat: 98% on 100% FiO2 on vent Wt: 236.5>238.7>248.9>248.9>247>244.9>251>254>243.6>249.3>250.4 lbs   LVAD interrogation reveals:  Speed: 5100 Flow: 4.0 Power:  3.5w PI: 5.6 Alarms: none Events:  1 PI event today; 13 PI events overnight Hematocrit: 20 - do not adjust  Fixed speed: 5100  Low speed limit: 4800  Drive Line:  Dressing C/D/I; anchor intact and accurately applied. Weekly dressing changes per [facility] nurse; next dressing change due 03/21/19.  Labs:  LDH trend:  475>352>347>360>328>365>396>485>493>575>918>578  INR trend: 2.9>3.1>3.4>3.2>3.1>3.0>2.6>3.1>4.7>6.7>6.9>7.4  WBC trend: 10.0>12.8>15.2>12.7>14.2>16.0>13.5>15.8>28.5>17.9>21.3>20.2  AST trend: 141>490>288  ALT trend: 161>277>276  Anticoagulation Plan: -INR Goal: 2.0 - 2.5 -ASA Dose: 81 mg daily  Device:Medtronic 3 Lead Therapies: ON:  VF 200 bpm, FVT 200 - 240; VT 158 - 200 - ATP Therapies: AT/AF monitor on 171 bpm Pacing: DDD 60 Last check: 11/10/18  Cultures: 02/20/2019 - respiratory panel>> negative final 02/23/2019 - blood cxs> negative/final 03/27/2019- respiratory panel from bronch washings>> negative  04/02/2019- sputum cx>> no growth < 24hrs 03/21/2019- blood cx>> no growth <24hrs 03/04/2019- pneumocystis smear of bronch washings>> 03/11/2019- AFB culture/smear of bronch washings>>  Blood Products:  03/20/2019>> 1 FFP 03/15/19>> 1 FFP  Drips: Dopamine 2.5  mcg/kg/min Fentanyl 50 mcg/hr  Vasopressin 0.03 units/min  Respiratory: Intubated 03/13/19 Settings: 100% FiO2  Rate 22  Peep 10  TV 580  Plan/Recommendations:  1. Page VAD coordinator with any VAD equipment or drive line issues. 2. Weekly dressing changes per BS nurse; next change due 03/21/19.  Emerson Monte RN [place] Coordinator  Office: [phone]  24/7 Pager: [phone]

## 2019-03-15 NOTE — Progress Notes (Signed)
OT Cancellation/Discharge Note  Patient Details Name: [name] [name] MRN: [id number] DOB: 03/28/1950   Cancelled Treatment:    Reason Eval/Treat Not Completed: Patient not medically ready.  Pt continues to be intubated and not medically appropriate for therapy services at this time.  OT will sign off.  Please reorder if/when appropriate.  Nilsa Nutting., OTR/L Acute Rehabilitation Services Pager [phone] Office [phone]   Lucille Passy M 03/15/2019, 6:18 AM

## 2019-03-16 ENCOUNTER — Other Ambulatory Visit: Payer: Self-pay

## 2019-03-16 ENCOUNTER — Inpatient Hospital Stay (HOSPITAL_COMMUNITY): Payer: PPO

## 2019-03-16 LAB — CMV IGM: CMV IgM: <30 AU/mL (ref 0.0–29.9)

## 2019-03-16 LAB — POCT I-STAT 7, (LYTES, BLD GAS, ICA,H+H)
Acid-Base Excess: 1 mmol/L (ref 0.0–2.0)
Acid-base deficit: 1 mmol/L (ref 0.0–2.0)
Bicarbonate: 27.4 mmol/L (ref 20.0–28.0)
Bicarbonate: 26 mmol/L (ref 20.0–28.0)
Calcium, Ion: 1.23 mmol/L (ref 1.15–1.40)
Calcium, Ion: 1.2 mmol/L (ref 1.15–1.40)
HCT: 41 % (ref 39.0–52.0)
HCT: 40 % (ref 39.0–52.0)
Hemoglobin: 13.9 g/dL (ref 13.0–17.0)
Hemoglobin: 13.6 g/dL (ref 13.0–17.0)
O2 Saturation: 91 %
O2 Saturation: 91 %
Patient temperature: 99.7
Patient temperature: 98.7
Potassium: 5.3 mmol/L — ABNORMAL HIGH (ref 3.5–5.1)
Potassium: 4.9 mmol/L (ref 3.5–5.1)
Sodium: 151 mmol/L — ABNORMAL HIGH (ref 135–145)
Sodium: 153 mmol/L — ABNORMAL HIGH (ref 135–145)
TCO2: 29 mmol/L (ref 22–32)
TCO2: 27 mmol/L (ref 22–32)
pCO2 arterial: 50.5 mmHg — ABNORMAL HIGH (ref 32.0–48.0)
pCO2 arterial: 49.7 mmHg — ABNORMAL HIGH (ref 32.0–48.0)
pH, Arterial: 7.345 — ABNORMAL LOW (ref 7.350–7.450)
pH, Arterial: 7.326 — ABNORMAL LOW (ref 7.350–7.450)
pO2, Arterial: 68 mmHg — ABNORMAL LOW (ref 83.0–108.0)
pO2, Arterial: 66 mmHg — ABNORMAL LOW (ref 83.0–108.0)

## 2019-03-16 LAB — CBC
HCT: 41 % (ref 39.0–52.0)
Hemoglobin: 12.4 g/dL — ABNORMAL LOW (ref 13.0–17.0)
MCH: 25.3 pg — ABNORMAL LOW (ref 26.0–34.0)
MCHC: 30.2 g/dL (ref 30.0–36.0)
MCV: 83.7 fL (ref 80.0–100.0)
Platelets: 57 10*3/uL — ABNORMAL LOW (ref 150–400)
RBC: 4.9 MIL/uL (ref 4.22–5.81)
RDW: 19.7 % — ABNORMAL HIGH (ref 11.5–15.5)
WBC: 17.7 10*3/uL — ABNORMAL HIGH (ref 4.0–10.5)
nRBC: 0.8 % — ABNORMAL HIGH (ref 0.0–0.2)

## 2019-03-16 LAB — PROTIME-INR
INR: 6.3 (ref 0.8–1.2)
Prothrombin Time: 56.1 seconds — ABNORMAL HIGH (ref 11.4–15.2)

## 2019-03-16 LAB — CYTOLOGY - NON PAP

## 2019-03-16 LAB — PHOSPHORUS
Phosphorus: 5.1 mg/dL — ABNORMAL HIGH (ref 2.5–4.6)
Phosphorus: 4.9 mg/dL — ABNORMAL HIGH (ref 2.5–4.6)

## 2019-03-16 LAB — COMPREHENSIVE METABOLIC PANEL
ALT: 251 U/L — ABNORMAL HIGH (ref 0–44)
AST: 254 U/L — ABNORMAL HIGH (ref 15–41)
Albumin: 2 g/dL — ABNORMAL LOW (ref 3.5–5.0)
Alkaline Phosphatase: 103 U/L (ref 38–126)
Anion gap: 12 (ref 5–15)
BUN: 128 mg/dL — ABNORMAL HIGH (ref 8–23)
CO2: 25 mmol/L (ref 22–32)
Calcium: 8.8 mg/dL — ABNORMAL LOW (ref 8.9–10.3)
Chloride: 110 mmol/L (ref 98–111)
Creatinine, Ser: 2.39 mg/dL — ABNORMAL HIGH (ref 0.61–1.24)
GFR calc Af Amer: 31 mL/min — ABNORMAL LOW (ref >60)
GFR calc non Af Amer: 27 mL/min — ABNORMAL LOW (ref >60)
Glucose, Bld: 316 mg/dL — ABNORMAL HIGH (ref 70–99)
Potassium: 5.4 mmol/L — ABNORMAL HIGH (ref 3.5–5.1)
Sodium: 147 mmol/L — ABNORMAL HIGH (ref 135–145)
Total Bilirubin: 3.1 mg/dL — ABNORMAL HIGH (ref 0.3–1.2)
Total Protein: 7.1 g/dL (ref 6.5–8.1)

## 2019-03-16 LAB — PROCALCITONIN: Procalcitonin: 2.43 ng/mL

## 2019-03-16 LAB — GLUCOSE, CAPILLARY
Glucose-Capillary: 272 mg/dL — ABNORMAL HIGH (ref 70–99)
Glucose-Capillary: 311 mg/dL — ABNORMAL HIGH (ref 70–99)
Glucose-Capillary: 325 mg/dL — ABNORMAL HIGH (ref 70–99)
Glucose-Capillary: 341 mg/dL — ABNORMAL HIGH (ref 70–99)
Glucose-Capillary: 346 mg/dL — ABNORMAL HIGH (ref 70–99)
Glucose-Capillary: 438 mg/dL — ABNORMAL HIGH (ref 70–99)

## 2019-03-16 LAB — PREPARE FRESH FROZEN PLASMA: Unit division: 0

## 2019-03-16 LAB — LACTIC ACID, PLASMA: Lactic Acid, Venous: 3.3 mmol/L (ref 0.5–1.9)

## 2019-03-16 LAB — EPSTEIN-BARR VIRUS VCA, IGM: EBV VCA IgM: <36 U/mL (ref 0.0–35.9)

## 2019-03-16 LAB — BPAM FFP
Blood Product Expiration Date: 202101142359
ISSUE DATE / TIME: 202101130527
Unit Type and Rh: 6200

## 2019-03-16 LAB — MAGNESIUM: Magnesium: 3.3 mg/dL — ABNORMAL HIGH (ref 1.7–2.4)

## 2019-03-16 LAB — LACTATE DEHYDROGENASE: LDH: 633 U/L — ABNORMAL HIGH (ref 98–192)

## 2019-03-16 LAB — EPSTEIN-BARR VIRUS VCA, IGG: EBV VCA IgG: >600 U/mL — ABNORMAL HIGH (ref 0.0–17.9)

## 2019-03-16 MED ORDER — SODIUM CHLORIDE 0.9% FLUSH: 3.0000 mL | INTRAVENOUS | Status: DC | PRN

## 2019-03-16 MED ORDER — SODIUM CHLORIDE 0.9 % IV SOLN: INTRAVENOUS | Status: DC

## 2019-03-16 MED ORDER — SODIUM CHLORIDE 0.9 % IV SOLN: 250.0000 mL | INTRAVENOUS | Status: DC | PRN

## 2019-03-16 MED ORDER — INSULIN GLARGINE 100 UNIT/ML ~~LOC~~ SOLN
10.0000 [IU] | Freq: Every day | SUBCUTANEOUS | Status: DC
  Administered 2019-03-16: 12:00:00 10 [IU] via SUBCUTANEOUS
  Filled 2019-03-16 (×2): qty 0.1

## 2019-03-16 MED ORDER — SODIUM CHLORIDE 0.9% FLUSH
3.0000 mL | Freq: Two times a day (BID) | INTRAVENOUS | Status: DC
  Administered 2019-03-16: 23:00:00 3 mL via INTRAVENOUS

## 2019-03-16 MED ORDER — NOREPINEPHRINE 4 MG/250ML-% IV SOLN
0.0000 ug/min | INTRAVENOUS | Status: DC
  Filled 2019-03-16: qty 250

## 2019-03-16 MED ORDER — INSULIN ASPART 100 UNIT/ML ~~LOC~~ SOLN
4.0000 [IU] | Freq: Three times a day (TID) | SUBCUTANEOUS | Status: DC
  Administered 2019-03-16 (×2): 4 [IU] via SUBCUTANEOUS

## 2019-03-16 MED ORDER — METHYLPREDNISOLONE SODIUM SUCC 125 MG IJ SOLR
60.0000 mg | Freq: Four times a day (QID) | INTRAMUSCULAR | Status: DC
  Administered 2019-03-16 – 2019-03-17 (×5): 60 mg via INTRAVENOUS
  Filled 2019-03-16 (×5): qty 2

## 2019-03-16 MED ORDER — FUROSEMIDE 10 MG/ML IJ SOLN
80.0000 mg | Freq: Once | INTRAMUSCULAR | Status: AC
  Administered 2019-03-16: 80 mg via INTRAVENOUS
  Filled 2019-03-16: qty 8

## 2019-03-16 NOTE — Progress Notes (Signed)
NAME:  [name] [name], MRN:  [id number], DOB:  1950/12/17, LOS: 32 ADMISSION DATE:  02/04/2019, CONSULTATION DATE:  02/26/2019 REFERRING MD:  Bensimhon, CHIEF COMPLAINT:  hypoxemia  Brief History   69 year old male with extensive cardiac history with LVAD and PCCM consulted for acute Hypoxemia.  History of present illness   69 yo M with extensive cardiac history with LVAD presenting with acute onset hypoxia possibly concerning for HF exacerbation vs amiodarone toxicity vs unknown process. COVID negative x 3. 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  Past Medical History  He has a past medical history of AICD (automatic cardioverter/defibrillator) present (02/05/2014), Atrial flutter ([place]) (04/2012), CAD (coronary artery disease) [id number] X 2 ), CHF (congestive heart failure) ([place]), Chronic anticoagulation, Chronic systolic heart failure ([place]), CKD (chronic kidney disease), Diabetic retinopathy ([place]), DM type 2 (diabetes mellitus, type 2) ([place]), HTN (hypertension), Hypercholesteremia, ICD (implantable cardiac defibrillator) in place, Ischemic cardiomyopathy (March 2015), Nephrolithiasis, and Ventricular tachycardia ([place]).  Significant Hospital Events   12/29 Admit to HF service 12/30 High RES CT shows diffuse patchy ground glass - see imaging 1/2 Improved oxygenation to 10L O2 1/4 on Bipap 1/9 HF [place], QHS Bipap 1/11 Intubation   Consults:  Heart failure  Procedures:  1/9 Art line 1/11 Intubation 1/12 Bronchoscopy   Significant Diagnostic Tests:  12/29 CT Chest High Resolution IMPRESSION: 1. Diffuse patchy pulmonary parenchymal ground-glass, scattered consolidation and suspected mild architectural distortion, with a slight upper/midlung zone predominance. Findings are new/progressive from 05/03/2018 and can be seen with amiodarone toxicity. Pneumonia is not excluded. 2. Trace bilateral pleural effusions. 3. Increased  prominence of mediastinal lymph nodes, likely reactive. 4. Liver margin may be slightly irregular, raising suspicion for cirrhosis. 5. Gallbladder sludge and/or stones. 6.  Aortic atherosclerosis (ICD10-I70.0). 7. Enlarged pulmonic trunk, indicative of pulmonary arterial  1/12 RUQ U/S IMPRESSION: 1. Gallbladder sludge and small stones. No definite stone graphic findings of acute cholecystitis. 2. Fatty liver.  Micro Data:  12/27 SARS2 >> neg 12/29 RVP >> Negative 12/29 SARS 2 >> Negative 12/29 MRSA PCR >> neg 1/12 Blood cultures - NGTD at 24hrs 1/13 Bronch alveolar lavage - No organism - few WBC  Antimicrobials:  Vancomycin 1/8 --> Meropenem 1/8 -->  Interim history/subjective:  [place] this morning, requiring increased PEEP and FIO2  Objective   Blood pressure 92/78, pulse 85, temperature (P) 98.9 F (37.2 C), temperature source (P) Oral, resp. rate (!) 37, height 5\' 10"  (1.778 m), weight 113.6 kg, SpO2 90 %. CVP:  [15 mmHg-22 mmHg] 19 mmHg  Vent Mode: PRVC FiO2 (%):  [100 %] 100 % Set Rate:  [22 bmp] 22 bmp Vt Set:  [580 mL] 580 mL PEEP:  [10 cmH20] 10 cmH20 Plateau Pressure:  [35 cmH20-38 cmH20] 37 cmH20   Intake/Output Summary (Last 24 hours) at 03/16/2019 0721 Last data filed at 03/16/2019 0300 Gross per 24 hour  Intake 1209.92 ml  Output 900 ml  Net 309.92 ml   Filed Weights   03/11/19 0500 03/12/19 0446 04/02/2019 0400  Weight: 111.4 kg 113.1 kg 113.6 kg    Examination: General: NAD, intubated HENT: EOMI, sclera antiicteric Lungs: CTAB, on vent Cardiovascular: LVAD hum Abdomen: decreased bowel sounds, non- tender, non-distended Extremities: 1+ LEE, no deformities Neuro: moving extremities  Resolved Hospital Problem list     Assessment & Plan:  Acute hypoxemic respiratory failure: 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  Workup for infectious cause has been unrevealing. - Worsening  oxygenation, on vent. - Bronchospy on 1/12. No  organisms seen. Acid fast smear negative. Neutrophil predominant cell count - Requiring increased FIO2 on 1/13 P:   -continue prednisone 60 - F/u complete bronch results  - Continue Vanc , meropenem - ABG  Acute on chronic HFrEF:  Normal regimen MWF torsemide 20mg , off bp meds, LVAD functioning appropriately.  P:  -per cardiology, they are considering doing RHC and assessing need for CVVHD   Elevated LFT's - RUQ with sludge,small stones, fatty liver, but neg for acute cholecystitis - Liklely 2/2 to HF P: - follow labs  AKI on CKD IIIB: baseline cr around 1.5-2, Cr trend,  2.16 >2.42>2.79>2.4>2.39. Good urine output P:   Follow urine output, and trend labs  Best practice:  Diet: Tube feeds  Pain/Anxiety/Delirium protocol (if indicated): Fentanyl  VAP protocol (if indicated): Vap protocol  DVT prophylaxis: Warfarin GI prophylaxis: Protonix Glucose control: SSI Mobility: Up with assistance  Code Status: Partial  Family Communication: Wife bedside Disposition: ICU  Labs   CBC: Recent Labs  Lab 03/11/19 0035 03/11/19 1644 03/13/19 0454 03/13/19 0616 03/20/2019 0332 03/23/2019 0332 03/19/2019 0537 03/22/2019 0721 03/15/19 0213 03/15/19 0516 03/15/19 0657 03/16/19 0247  WBC 26.4*  --  17.9*  --  21.3*  --   --   --  20.2*  --   --  17.7*  NEUTROABS 24.9*  --   --   --   --   --   --   --   --   --   --   --   HGB 12.3*   < > 11.9*   < > 11.5*   < > 13.3  --  11.7* 13.3 13.6 12.4*  HCT 37.5*   < > 37.5*   < > 36.9*   < > 39.0  --  37.3* 39.0 40.0 41.0  MCV 79.8*  --  81.0  --  81.1  --   --   --  81.1  --   --  83.7  PLT 213  --  108*  --  83*  --   --  80* 68*  --   --  57*   < > = values in this interval not displayed.    Basic Metabolic Panel: Recent Labs  Lab 03/11/19 0035 03/11/19 1644 03/12/19 0500 03/12/19 1715 03/13/19 0228 03/13/19 0238 03/13/19 0356 03/13/19 0449 03/20/2019 0332 03/30/2019 0332 03/21/2019 0537 03/15/19 0213 03/15/19 0516 03/15/19 0657  03/15/19 1141 03/15/19 1644 03/16/19 0247  NA 138   < > 135  138   < >   < >  --  140   < > 140   < > 142 145 148* 148*  --   --  147*  K 4.7   < > 4.8  4.8   < >   < >  --  4.4   < > 4.8   < > 6.2* 5.2* 5.0 4.6  --   --  5.4*  CL 103  --  101  --   --   --  103  --  104  --   --  109  --   --   --   --  110  CO2 22  --  21*  --   --   --  24  --  24  --   --  26  --   --   --   --  25  GLUCOSE 204*  --  269*  --   --   --  195*  --  194*  --   --  217*  --   --   --   --  316*  BUN 89*  --  107*  --   --   --  107*  --  122*  --   --  117*  --   --   --   --  128*  CREATININE 2.02*  --  2.42*  --   --   --  2.16*  --  2.79*  --   --  2.40*  --   --   --   --  2.39*  CALCIUM 8.6*  --  8.7*  --   --   --  8.8*  --  8.5*  --   --  8.8*  --   --   --   --  8.8*  MG 2.9*  --  2.9*  --   --  3.0*  --   --  3.0*  --   --  3.1*  --   --   --   --  3.3*  PHOS 4.3  --   --   --   --   --   --   --   --   --   --   --   --   --  4.8* 5.0* 5.1*   < > = values in this interval not displayed.   GFR: Estimated Creatinine Clearance: 36.8 mL/min (A) (by C-G formula based on SCr of 2.39 mg/dL (H)). Recent Labs  Lab 03/11/19 0035 03/12/19 0500 03/13/19 0454 03/15/2019 0332 03/24/2019 0935 03/28/2019 1027 03/15/19 0213 03/15/19 0924 03/16/19 0245 03/16/19 0247  PROCALCITON  --  3.81  --   --  2.54  --  2.42  --   --  2.43  WBC   < >  --  17.9* 21.3*  --   --  20.2*  --   --  17.7*  LATICACIDVEN  --   --   --   --   --  3.1*  --  3.1* 3.3*  --    < > = values in this interval not displayed.    Liver Function Tests: Recent Labs  Lab 03/12/19 0500 03/13/19 0356 04/01/2019 0332 03/15/19 0213 03/16/19 0247  AST 104* 141* 490* 288* 254*  ALT 132* 161* 277* 276* 251*  ALKPHOS 93 100 87 93 103  BILITOT 1.9* 1.6* 2.4* 2.7* 3.1*  PROT 7.1 6.6 6.5 6.8 7.1  ALBUMIN 1.8* 2.0* 1.8* 1.8* 2.0*   No results for input(s): LIPASE, AMYLASE in the last 168 hours. Recent Labs  Lab 03/10/19 0316  AMMONIA 24     ABG    Component Value Date/Time   PHART 7.377 03/15/2019 0657   PCO2ART 41.8 03/15/2019 0657   PO2ART 38.0 (LL) 03/15/2019 0657   HCO3 24.7 03/15/2019 0657   TCO2 26 03/15/2019 0657   ACIDBASEDEF 1.0 03/15/2019 0657   O2SAT 72.0 03/15/2019 0657     Coagulation Profile: Recent Labs  Lab 03/13/19 0238 03/26/2019 0332 03/04/2019 0721 03/15/19 0213 03/16/19 0247  INR 6.7* 7.4* 6.9* 7.4* 6.3*    Cardiac Enzymes: No results for input(s): CKTOTAL, CKMB, CKMBINDEX, TROPONINI in the last 168 hours.  HbA1C: Hemoglobin A1C  Date/Time Value Ref Range Status  02/10/2018 01:50 PM 8.9 (A) 4.0 - 5.6 % Final  11/11/2017 03:11 PM 6.3 (A) 4.0 - 5.6 % Final  Hgb A1c MFr Bld  Date/Time Value Ref Range Status  02/11/2019 11:20 AM 9.4 (H) 4.8 - 5.6 % Final    Comment:    (NOTE) Pre diabetes:          5.7%-6.4% Diabetes:              >6.4% Glycemic control for   <7.0% adults with diabetes   08/14/2017 05:30 AM 8.4 (H) 4.8 - 5.6 % Final    Comment:    (NOTE) Pre diabetes:          5.7%-6.4% Diabetes:              >6.4% Glycemic control for   <7.0% adults with diabetes     CBG: Recent Labs  Lab 03/15/19 1149 03/15/19 1514 03/15/19 1944 03/15/19 2347 03/16/19 0351  GLUCAP 200* 185* 204* 267* 272*    Review of Systems:   Unable to complete, patient intubated   Past Medical History  He,  has a past medical history of AICD (automatic cardioverter/defibrillator) present (02/05/2014), Atrial flutter ([place]) (04/2012), CAD (coronary artery disease) [id number] X 2 ), CHF (congestive heart failure) ([place]), Chronic anticoagulation, Chronic systolic heart failure ([place]), CKD (chronic kidney disease), Diabetic retinopathy ([place]), DM type 2 (diabetes mellitus, type 2) ([place]), HTN (hypertension), Hypercholesteremia, ICD (implantable cardiac defibrillator) in place, Ischemic cardiomyopathy (March 2015), Nephrolithiasis, and Ventricular tachycardia ([place]).   Surgical History    Past Surgical  History:  Procedure Laterality Date  . ATRIAL FLUTTER ABLATION N/A 05/19/2012   Procedure: ATRIAL FLUTTER ABLATION;  Surgeon: Thompson Grayer, MD;  Location: [facility] CATH LAB;  Service: Cardiovascular;  Laterality: N/A;  . BI-VENTRICULAR IMPLANTABLE CARDIOVERTER DEFIBRILLATOR UPGRADE N/A 02/05/2014   Procedure: BI-VENTRICULAR IMPLANTABLE CARDIOVERTER DEFIBRILLATOR UPGRADE;  Surgeon: Evans Lance, MD;  Location: [facility] CATH LAB;  Service: Cardiovascular;  Laterality: N/A;  . BIV ICD GENERTAOR CHANGE OUT  02/05/2014   Upgrade to Medtronic biventricular ICD, serial number  [id number] by Dr. [name]  . CARDIAC DEFIBRILLATOR PLACEMENT  2007    Medtronic Maximo VR, serial number [id number]  . INSERTION OF IMPLANTABLE LEFT VENTRICULAR ASSIST DEVICE N/A 08/16/2017   Procedure: INSERTION OF IMPLANTABLE LEFT VENTRICULAR ASSIST DEVICE-HM3;  Surgeon: Ivin Poot, MD;  Location: [place];  Service: Open Heart Surgery;  Laterality: N/A;  . IR FLUORO GUIDE CV LINE RIGHT  08/12/2017  . IR US GUIDE VASC ACCESS RIGHT  08/12/2017  . MEDIASTINAL EXPLORATION  08/17/2017   Procedure: MEDIASTINAL REXPLORATION with evacuation of hematoma;  Surgeon: Prescott Gum, Collier Salina, MD;  Location: [facility] OR;  Service: Open Heart Surgery;;  . PERCUTANEOUS CORONARY STENT INTERVENTION (PCI-S)  January 2002   PTCA/Stent Distal RCA  . PERCUTANEOUS CORONARY STENT INTERVENTION (PCI-S)  June 2002   PTCA/Stent x 3 RCA, thrombolysis - failed  . PERCUTANEOUS CORONARY STENT INTERVENTION (PCI-S)  July 2006   TAXUS stents to prox and distal LAD  . RIGHT HEART CATH N/A 07/22/2017   Procedure: RIGHT HEART CATH;  Surgeon: Jolaine Artist, MD;  Location: [place] CV LAB;  Service: Cardiovascular;  Laterality: N/A;  . RIGHT HEART CATH N/A 08/13/2017   Procedure: RIGHT HEART CATH - swan;  Surgeon: Jolaine Artist, MD;  Location: [place] CV LAB;  Service: Cardiovascular;  Laterality: N/A;  . RIGHT HEART CATH N/A 05/05/2018   Procedure: RIGHT HEART CATH;   Surgeon: Jolaine Artist, MD;  Location: [place] CV LAB;  Service: Cardiovascular;  Laterality: N/A;  . RIGHT/LEFT HEART CATH AND CORONARY ANGIOGRAPHY  N/A 02/18/2017   Procedure: RIGHT/LEFT HEART CATH AND CORONARY ANGIOGRAPHY;  Surgeon: Jolaine Artist, MD;  Location: [place] CV LAB;  Service: Cardiovascular;  Laterality: N/A;  . STERNAL CLOSURE N/A 08/19/2017   Procedure: STERNAL CLOSURE;  Surgeon: Ivin Poot, MD;  Location: [place];  Service: Thoracic;  Laterality: N/A;  . TEE WITHOUT CARDIOVERSION N/A 08/16/2017   Procedure: TRANSESOPHAGEAL ECHOCARDIOGRAM (TEE);  Surgeon: Prescott Gum, Collier Salina, MD;  Location: [place];  Service: Open Heart Surgery;  Laterality: N/A;  . TEE WITHOUT CARDIOVERSION N/A 08/19/2017   Procedure: TRANSESOPHAGEAL ECHOCARDIOGRAM (TEE);  Surgeon: Prescott Gum, Collier Salina, MD;  Location: [place];  Service: Thoracic;  Laterality: N/A;  . TRICUSPID VALVE REPLACEMENT N/A 08/16/2017   Procedure: TRICUSPID VALVE REPAIR using Oletta Lamas MC3 Ring size 30;  Surgeon: Ivin Poot, MD;  Location: [place];  Service: Open Heart Surgery;  Laterality: N/A;     Social History   reports that he quit smoking about 19 years ago. His smoking use included cigarettes. He has a 29.00 pack-year smoking history. He has never used smokeless tobacco. He reports that he does not drink alcohol or use drugs.   Family History   His family history includes Alzheimer's disease in his mother; CAD in his brother, brother, brother, and brother; Diabetes in his brother; Healthy in his daughter and son; Heart attack in his mother; Heart failure in his father; Other in his brother; Prostate cancer in his brother. There is no history of Stomach cancer or Colon cancer.   Allergies Allergies  Allergen Reactions  . Penicillins Hives, Itching and Other (See Comments)    Did it involve swelling of the face/tongue/throat, SOB, or low BP? No Did it involve sudden or severe rash/hives, skin peeling, or any reaction  on the inside of your mouth or nose? No Did you need to seek medical attention at a hospital or doctor's office? Unknown When did it last happen?childhood allergy If all above answers are "NO", may proceed with cephalosporin use.    Marland Kitchen Alcohol-Sulfur [Sulfur] Other (See Comments)    Burns skin     Home Medications  Prior to Admission medications   Medication Sig Start Date End Date Taking? Authorizing Provider  acetaminophen (TYLENOL) 500 MG tablet Take 1,000 mg by mouth every 6 (six) hours as needed for moderate pain or headache.    Yes [provider]  ascorbic acid (VITAMIN C) 500 MG tablet Take 1 tablet (500 mg total) by mouth 2 (two) times daily. 09/24/17  Yes Love, Ivan Anchors, PA-C  aspirin 81 MG chewable tablet Chew 1 tablet (81 mg total) by mouth daily. 09/10/17  Yes Clegg, Amy D, NP  gabapentin (NEURONTIN) 300 MG capsule Take 2 capsules (600 mg total) by mouth 2 (two) times daily. 02/09/19  Yes Bensimhon, Shaune Pascal, MD  insulin NPH-regular Human (70-30) 100 UNIT/ML injection Inject 40 Units into the skin daily.    Yes [provider]  losartan (COZAAR) 25 MG tablet Take 1 tablet (25 mg total) by mouth daily. 09/27/18  Yes Bensimhon, Shaune Pascal, MD  magnesium oxide (MAG-OX) 400 (241.3 Mg) MG tablet Take 1 tablet (400 mg total) by mouth 2 (two) times daily. 09/24/17  Yes Love, Ivan Anchors, PA-C  Multiple Vitamin (MULTIVITAMIN WITH MINERALS) TABS tablet Take 1 tablet by mouth daily. 09/25/17  Yes Love, Ivan Anchors, PA-C  pantoprazole (PROTONIX) 40 MG tablet Take 1 tablet (40 mg total) by mouth daily. 02/22/19  Yes Clegg, Amy D, NP  predniSONE (DELTASONE) 20 MG tablet Take 3 tablets (60 mg total) by mouth daily with breakfast for 7 days, THEN 2 tablets (40 mg total) daily with breakfast. 02/26/19 04/04/19 Yes Bensimhon, Shaune Pascal, MD  torsemide (DEMADEX) 20 MG tablet Take 1 tablet (20 mg total) by mouth every Monday, Wednesday, and Friday. 02/09/19  Yes Bensimhon, Shaune Pascal, MD   triamcinolone (NASACORT ALLERGY 24HR) 55 MCG/ACT AERO nasal inhaler Place 2 sprays into the nose daily.   Yes [provider]  warfarin (COUMADIN) 2.5 MG tablet Take on tablet daily or as directed Patient taking differently: Take 2.5 mg by mouth daily.  10/06/18  Yes Bensimhon, Shaune Pascal, MD  amiodarone (PACERONE) 200 MG tablet Take 100-200 mg by mouth See admin instructions. Take 1 tablet (200 mg totally) by mouth in the morning; take 0.5 tablet (100 mg totally) in the evening 12/25/18   [provider]  Insulin Pen Needle (COMFORT EZ PEN NEEDLES) 32G X 6 MM MISC 1 application by Does not apply route at bedtime. 09/24/17   Bary Leriche, PA-C     Critical care time:     Tamsen Snider, MD PGY1

## 2019-03-16 NOTE — Progress Notes (Signed)
[facility] Progress Note Patient Name: [name] [name] DOB: 04/04/1950 MRN: [id number]   Date of Service  03/16/2019  HPI/Events of Note  Blood sugar 438 mg %  eICU Interventions  Insulin infusion ordered.        Kerry Kass Dereon Williamsen 03/16/2019, 11:58 PM

## 2019-03-16 NOTE — Progress Notes (Signed)
Patient's blood glucose 438. RN contacted [facility] to relay message to physician. Waiting for orders.

## 2019-03-16 NOTE — Progress Notes (Signed)
Inpatient Diabetes Program Recommendations  AACE/ADA: New Consensus Statement on Inpatient Glycemic Control (2015)  Target Ranges:  Prepandial:   less than 140 mg/dL      Peak postprandial:   less than 180 mg/dL (1-2 hours)      Critically ill patients:  140 - 180 mg/dL   Lab Results  Component Value Date   GLUCAP 311 (H) 03/16/2019   HGBA1C 9.4 (H) 02/23/2019    Review of Glycemic Control Results for [name] [name], [name] [name] (MRN [id number]) as of 03/16/2019 10:40  Ref. Range 03/15/2019 19:44 03/15/2019 23:47 03/16/2019 03:51 03/16/2019 07:44  Glucose-Capillary Latest Ref Range: 70 - 99 mg/dL 204 (H) 267 (H) 272 (H) 311 (H)   Diabetes history: Type 2 DM Outpatient Diabetes medications: Novolog 70/30 40 units QAM Current orders for Inpatient glycemic control: Lantus 10 units QD, Novolog 0-15 units Q4H Solumedrol 60 mg Q6H Vital @ 65 ml/hr  Inpatient Diabetes Program Recommendations:    Anticipate trends to continue to increase, especially in the setting of steroids.  Consider increasing Lantus to 10 units BID and adding Novolog 5 units Q4H for tube feed coverage (to be stopped or held in the event tube feeds are stopped).  Thanks, Bronson Curb, MSN, RNC-OB Diabetes Coordinator [phone] (8a-5p)

## 2019-03-16 NOTE — Progress Notes (Addendum)
Patient ID: EDNA GROVER, male   DOB: May 13, 1950, 69 y.o.   MRN: [id number]   Advanced Heart Failure VAD Team Note  PCP-Cardiologist: No primary care provider on file.   Subjective:    Worsening MSOF. Required intubation 1/11. Respiratory panel negative. Suspected amio pulmonary toxicity. FiO2 100% . S/p bronchoscopy 1/12 for diagnostic eval of airways + specimen collection. Culture growing GNRs.    LDH remains elevated and trending back up today, 493 -> 468 -> 555 -> 575--> 918 --> 578-->633. DIC w/u negative (fibrinogen normal range, 436) . However w/ unexplained thrombocytopenina, now at 57K. INR drifting down at 6.3. Hgb stable at 14.   On Vasopressin 0.03 for BP support. MAPs in the 80s-90s. On Dopamine 3.5 mcg. Lactic Acid 3.3 today (3.1 yesterday).  Lasix has been held with low VAD flows and rising creatinine. Got IVFs 1/10. Now off. SCr improved, 2.79>>2.40. Stable at 2.39 today.   LVAD INTERROGATION:  HeartMate 3 LVAD:   Flow 3.6 liters/min, speed 5100, power 3.5 PI 5.8.  VAD interrogated personally. Parameters stable.  Objective:    Vital Signs:   Temp:  [97.8 F (36.6 C)-101.9 F (38.8 C)] 97.8 F (36.6 C) (01/14 0745) Pulse Rate:  [78-129] 90 (01/14 0831) Resp:  [12-54] 22 (01/14 0831) BP: (84-92)/(67-78) 92/78 (01/13 2000) SpO2:  [72 %-100 %] 90 % (01/14 0000) Arterial Line BP: (77-101)/(54-83) 91/74 (01/14 0700) FiO2 (%):  [100 %] 100 % (01/14 0831) Last BM Date: 03/05/2019 Mean arterial Pressure 70s on VP   Intake/Output:   Intake/Output Summary (Last 24 hours) at 03/16/2019 1114 Last data filed at 03/16/2019 1000 Gross per 24 hour  Intake 1339.75 ml  Output 600 ml  Net 739.75 ml     Physical Exam   Physical Exam: General:  Critically ill [name], remains intubated and sedated  HEENT: normal Neck: supple. no JVD. Carotids 2+ bilat; no bruits. No lymphadenopathy or thyromegaly appreciated. Cor: LVAD Hum.  Lungs: intubated. course breath sounds  bilaterally Abdomen: obese, soft, nontender, nondistended. No hepatosplenomegaly. No bruits or masses. Good bowel sounds.  Driveline site stable. Anchor intact, dressing applied. Clean and dry. Extremities: no cyanosis, clubbing, rash, 1+ bilateral LEE edema Neuro: intubated and sedated   Telemetry   AF 70s Personally reviewed   Labs   Basic Metabolic Panel: Recent Labs  Lab 03/11/19 0035 03/11/19 1644 03/12/19 0500 03/12/19 0500 03/12/19 1715 03/13/19 0238 03/13/19 0356 03/13/19 0449 03/31/2019 0332 03/05/2019 0537 03/15/19 0213 03/15/19 0516 03/15/19 0657 03/15/19 1141 03/15/19 1644 03/16/19 0247 03/16/19 0831  NA 138   < > 135  138   < >   < >  --  140   < > 140   < > 145 148* 148*  --   --  147* 151*  K 4.7   < > 4.8  4.8   < >   < >  --  4.4   < > 4.8   < > 5.2* 5.0 4.6  --   --  5.4* 5.3*  CL 103  --  101  --   --   --  103  --  104  --  109  --   --   --   --  110  --   CO2 22  --  21*  --   --   --  24  --  24  --  26  --   --   --   --  25  --  GLUCOSE 204*  --  269*  --   --   --  195*  --  194*  --  217*  --   --   --   --  316*  --   BUN 89*  --  107*  --   --   --  107*  --  122*  --  117*  --   --   --   --  128*  --   CREATININE 2.02*  --  2.42*  --   --   --  2.16*  --  2.79*  --  2.40*  --   --   --   --  2.39*  --   CALCIUM 8.6*  --  8.7*   < >  --   --  8.8*   < > 8.5*  --  8.8*  --   --   --   --  8.8*  --   MG 2.9*  --  2.9*  --   --  3.0*  --   --  3.0*  --  3.1*  --   --   --   --  3.3*  --   PHOS 4.3  --   --   --   --   --   --   --   --   --   --   --   --  4.8* 5.0* 5.1*  --    < > = values in this interval not displayed.    Liver Function Tests: Recent Labs  Lab 03/12/19 0500 03/13/19 0356 03/10/2019 0332 03/15/19 0213 03/16/19 0247  AST 104* 141* 490* 288* 254*  ALT 132* 161* 277* 276* 251*  ALKPHOS 93 100 87 93 103  BILITOT 1.9* 1.6* 2.4* 2.7* 3.1*  PROT 7.1 6.6 6.5 6.8 7.1  ALBUMIN 1.8* 2.0* 1.8* 1.8* 2.0*   No results for  input(s): LIPASE, AMYLASE in the last 168 hours. Recent Labs  Lab 03/10/19 0316  AMMONIA 24    CBC: Recent Labs  Lab 03/11/19 0035 03/11/19 1644 03/13/19 0454 03/13/19 0616 03/28/2019 0332 03/12/2019 0537 03/16/2019 0721 03/15/19 0213 03/15/19 0516 03/15/19 0657 03/16/19 0247 03/16/19 0831  WBC 26.4*  --  17.9*  --  21.3*  --   --  20.2*  --   --  17.7*  --   NEUTROABS 24.9*  --   --   --   --   --   --   --   --   --   --   --   HGB 12.3*   < > 11.9*   < > 11.5*   < >  --  11.7* 13.3 13.6 12.4* 13.9  HCT 37.5*   < > 37.5*   < > 36.9*   < >  --  37.3* 39.0 40.0 41.0 41.0  MCV 79.8*  --  81.0  --  81.1  --   --  81.1  --   --  83.7  --   PLT 213  --  108*  --  83*  --  80* 68*  --   --  57*  --    < > = values in this interval not displayed.    INR: Recent Labs  Lab 03/13/19 0238 03/25/2019 0332 03/10/2019 0721 03/15/19 0213 03/16/19 0247  INR 6.7* 7.4* 6.9* 7.4* 6.3*    Other results:     Imaging   DG Chest 1 View  Result Date: 03/15/2019 CLINICAL DATA:  Ventilator barotrauma. Negative COVID test 02/26/2019. EXAM: CHEST  1 VIEW COMPARISON:  03/15/2019 at 0540 hours, 03/18/2019, 02/11/2019 and CT chest 02/01/2019. FINDINGS: Endotracheal tube terminates 3.5 cm above the carina. Nasogastric tube is followed into the stomach with the tip projecting beyond the inferior margin of the image. Right PICC terminates at the SVC RA junction. Pacemaker/ICD lead tips and left ventricular assist device are stable in position. Heart is enlarged, stable. Diffuse coarsened and hazy appearance of the lungs bilaterally, unchanged from examination performed earlier the same day but possibly slightly progressive from 03/16/2019. A rounded area of lucency projects over the heart at the base of the left hemithorax, stable from recent prior exams but appears new from 02/12/2019. No definite pleural fluid although the left costophrenic angle is obscured by the LVAD. IMPRESSION: 1. 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 Pneumonia and acute respiratory distress syndrome are not excluded. Findings are similar to exam performed earlier the same day but appear slightly progressive from yesterday. 2. Rounded lucency at the base of the left hemithorax, stable from recent prior exams but new from 02/24/2019. If further evaluation is desired, CT chest without contrast could be performed. Electronically Signed   By: Lorin Picket M.D.   On: 03/15/2019 08:18   DG Chest Port (Stat)  Result Date: 03/16/2019 CLINICAL DATA:  Endotracheal intubation. EXAM: PORTABLE CHEST 1 VIEW COMPARISON:  March 15, 2019. FINDINGS: Stable cardiomediastinal silhouette. Endotracheal and nasogastric tubes are unchanged in position. Left ventricular assist device is unchanged in position. Left-sided pacemaker is unchanged in position. No pneumothorax is noted. Stable mild diffuse interstitial densities are noted concerning for edema or possibly inflammation. Small bilateral pleural effusions may be present. Bony thorax is unremarkable. IMPRESSION: Stable support apparatus. Stable mild diffuse interstitial densities are noted concerning for edema or possibly inflammation. Small bilateral pleural effusions may be present. Electronically Signed   By: Marijo Conception M.D.   On: 03/16/2019 07:34   DG Chest Port (Stat)  Result Date: 03/15/2019 CLINICAL DATA:  Status update EXAM: PORTABLE CHEST 1 VIEW COMPARISON:  Yesterday FINDINGS: Endotracheal tube tip at the clavicular heads. The enteric tube that at least reaches the stomach. Biventricular pacer leads from the left and LVAD in stable position. Right PICC with tip at the upper cavoatrial junction. Diffuse interstitial coarsening with low lung volumes. Stable cardiomegaly. No evidence of effusion or pneumothorax. IMPRESSION: Stable hardware positioning and pulmonary opacification. Electronically Signed   By:  Monte Fantasia M.D.   On: 03/15/2019 08:37   DG CHEST PORT 1 VIEW  Result Date: 03/20/2019 CLINICAL DATA:  Status post bronchoscopy. EXAM: PORTABLE CHEST 1 VIEW COMPARISON:  Same day. FINDINGS: Stable cardiomediastinal silhouette. Endotracheal and nasogastric tubes are unchanged in position. Left ventricular assist device is unchanged. No pneumothorax is noted. Left-sided pacemaker is unchanged. Stable mild bilateral lung opacities are noted concerning for edema or possibly inflammation. Bony thorax is unremarkable. IMPRESSION: Stable support apparatus. Stable mild bilateral lung opacities are noted concerning for edema or possibly inflammation. No pneumothorax is noted. Electronically Signed   By: Marijo Conception M.D.   On: 03/16/2019 14:56   DG Chest Port 1V same Day  Result Date: 03/26/2019 CLINICAL DATA:  69 year old male status post PICC placement. EXAM: PORTABLE CHEST 1 VIEW COMPARISON:  Earlier radiograph dated 03/09/2019. FINDINGS: Interval placement of a right-sided PICC with tip over central SVC likely close to the cavoatrial junction.  The tip of the PICC is suboptimally visualized due to patient's positioning and overlying support wires. Additional support apparatus in similar position. No interval change in the appearance of the lungs or cardiomediastinal silhouette since the earlier radiograph. IMPRESSION: Right-sided PICC with tip likely close to the cavoatrial junction. No other interval change. Electronically Signed   By: Anner Crete M.D.   On: 03/23/2019 16:49   [place] EKG SITE RITE  Result Date: 03/04/2019 If Site Rite image not attached, placement could not be confirmed due to current cardiac rhythm.  US Abdomen Limited RUQ  Result Date: 03/28/2019 CLINICAL DATA:  69 year old male with elevated LFTs. EXAM: ULTRASOUND ABDOMEN LIMITED RIGHT UPPER QUADRANT COMPARISON:  None. FINDINGS: Gallbladder: There is sludge and small stones within the gallbladder. There is no gallbladder wall  thickening or pericholecystic fluid. Evaluation for Murphy's sign is limited as the patient was unresponsive. Common bile duct: Diameter: 3 mm Liver: There is diffuse increased liver echogenicity most commonly seen in the setting of fatty infiltration. Superimposed inflammation or fibrosis is not excluded. Clinical correlation is recommended. Portal vein is patent on color Doppler imaging with normal direction of blood flow towards the liver. Other: There is trace upper abdominal ascites. IMPRESSION: 1. Gallbladder sludge and small stones. No definite stone graphic findings of acute cholecystitis. 2. Fatty liver. Electronically Signed   By: Anner Crete M.D.   On: 03/20/2019 15:20     Medications:     Scheduled Medications: . sodium chloride   Intravenous Once  . aspirin  81 mg Per Tube Daily  . chlorhexidine gluconate (MEDLINE KIT)  15 mL Mouth Rinse BID  . Chlorhexidine Gluconate Cloth  6 each Topical Daily  . feeding supplement (PRO-STAT SUGAR FREE 64)  30 mL Per Tube TID  . insulin aspart  0-15 Units Subcutaneous Q4H  . insulin aspart  4 Units Subcutaneous TID WC  . insulin glargine  10 Units Subcutaneous Daily  . mouth rinse  15 mL Mouth Rinse 10 times per day  . methylPREDNISolone (SOLU-MEDROL) injection  60 mg Intravenous Q6H  . pantoprazole sodium  40 mg Per Tube Daily  . sodium chloride flush  10-40 mL Intracatheter Q12H  . sodium chloride flush  10-40 mL Intracatheter Q12H  . sodium chloride flush  3 mL Intravenous Q12H  . Warfarin - Pharmacist Dosing Inpatient   Does not apply q1800    Infusions: . sodium chloride Stopped (03/16/19 0513)  . sodium chloride    . DOPamine 3.5 mcg/kg/min (03/16/19 1000)  . feeding supplement (VITAL AF 1.2 CAL) 1,000 mL (03/16/19 0737)  . fentaNYL infusion INTRAVENOUS 30 mcg/hr (03/16/19 1000)  . meropenem (MERREM) IV Stopped (03/16/19 0452)  . norepinephrine (LEVOPHED) Adult infusion Stopped (03/12/2019 0612)  . vancomycin Stopped (04/01/2019  1509)  . vasopressin (PITRESSIN) infusion - *FOR SHOCK* 0.03 Units/min (03/16/19 1000)    PRN Medications: sodium chloride, acetaminophen, fentaNYL (SUBLIMAZE) injection, fentaNYL (SUBLIMAZE) injection, midazolam, ondansetron (ZOFRAN) IV, sodium chloride flush, sodium chloride flush, sodium chloride flush   Assessment/Plan:    1. Acute hypoxemic respiratory failure: Recently seen in the ER and now admitted from clinic with marked hypoxemic requiring NRBM.  CXR with diffuse bilateral interstitial and airspace disease.  12/27 ESR elevated to 80 => 68 this admission with CRP 23.  PCT elevated mildly 0.67 => 0.7.  Respiratory virus and COVID-19 negative.  Blood cultures NGTD.  Afebrile, WBCs 21K on steroids.  Also noted to be volume overloaded on exam with significant JVD, weight gain,  and peripheral edema. High resolution CT chest with diffuse GGO that could be consistent with amiodarone toxicity versus infection.  Autoimmune panel negative.  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  Amiodarone has been stopped and he has been started on steroids.  - Decompensated on 1/7 moved to ICU. Worsening respiratory status and Intubated 11/11 - Initial PCT 0.7, increased to  5. Started vanc/meropenem for possible PNA on 1/8. CXR unchanged. Repeat PCT down to 2.43 today.  - S/p bronchoscopy 1/12 for diagnostic eval of airways + specimen collection for culture. NGTD  - He does have volume overload on exam but mostly RV failure. Do not think we can get much more from diuresis as left sided pressures typically decongested well with VAD. Plan RHC possibly to further evaluate +/- Swan placement in cath lab once INR drifts down.  - Continue serial ABGs - restart methylprednisolone 60 mg IV Q6H.  - continue vanc + meropenem   2. 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  MDT CRT-D device.  LVAD parameters stable on interrogation.  - Has been  diuresed aggressively this admission.  Creatinine bumped to 2.4. IVFs given. SCr 2.4>2.7>>2.4. IVFs now off. Plan possible RHC  to better assess volume status once INR drifts down   - INR goal 2-2.5 with LVAD, continue ASA 81. Warfarin currently on hold for supra therapeutic INR. - INR today 4.8 -> 5.9 -> 8.1.-> 6.7->7.1->7.4->6.3 - LDH higher at 475 at admission but with acute inflammatory disease, suspect pump thrombus formation less likely. LDH better today 493 -> 468 -> 555 -> 575--> 918-->578-->633. DIC panel w/u negative (fibrinogen normal range, 436) - On VP for BP support. MAPs in the 80s  3. Atrial fibrillation permanent:  - rate controlled. No change  4. 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 Frequent PVCs.  - On 1/4 started mexiletine 200 mg twice a day to suppress PVCs.  - Will stop all non-essential meds from now as clinical deterioration seems multifactorial  5. AKI on CKD stage 3:  - creatinine trending back down, 2.79>>2.39 - BUN now up to 128. K 5.3 Suspect he is uremic as well.  - May ultimately need CVVHD  6. Diabetes: Blood glucose up with steroids.  - Continue SSI - Continue home 70/30.   7. Severe Malnutrition - Albumin 1.8. Consulted dietitian - po intake poor  8. Transaminitis/liver failure - possibly due to amio.  - amio stopped -  Ammonia level ok 1/8 (24)   Length of Stay: [address] PA-C 03/16/2019, 11:14 AM  VAD Team --- VAD ISSUES ONLY--- Pager [phone] (7am - 7am)  Advanced Heart Failure Team  Pager [phone] (M-F; 7a - 4p)  Please contact [facility] for night-coverage after hours (4p -7a ) and weekends on [email or web address]   Agree with above.  Remains sedated on the vent. On VP and dopamine. CXR with diffuse infiltrates. Being treated for amio lung toxicity and probable overlying PNA.Developed MSOF with worsening renal and liver function but LFTs and creatinine beginning to level off. LDH up some. ALl cultures and serologies  remain negative. INR 6.3.   General:  Intubated sedate HEENT: normal  Neck: supple. JVP to jaw.  Carotids 2+ bilat; no bruits. No lymphadenopathy or thryomegaly appreciated. Cor: LVAD hum.  Lungs: + crackles Abdomen: obese soft, nontender, non-distended. No hepatosplenomegaly. No bruits or masses. Good bowel sounds. Driveline site clean. Anchor in place.  Extremities: no cyanosis, clubbing,  rash. Warm 1=2_ edema  Neuro: intubated sedated.   Ongoing MSOF. Still with very high vent requirements. Continue broad spectrum abx and steroids. Will plan RHC tomorrow to further evaluate degree of HF contribution. BUN continues to climble likely due in part to steroids but suspect he may need CVVHD for clearance if not coming down soon. VAD interrogated personally. Parameters stable. D/w CCM at bedside. Wife updated.    CRITICAL CARE Performed by: Glori Bickers  Total critical care time: 35 minutes  Critical care time was exclusive of separately billable procedures and treating other patients.  Critical care was necessary to treat or prevent imminent or life-threatening deterioration.  Critical care was time spent personally by me (independent of midlevel providers or residents) on the following activities: development of treatment plan with patient and/or surrogate as well as nursing, discussions with consultants, evaluation of patient's response to treatment, examination of patient, obtaining history from patient or surrogate, ordering and performing treatments and interventions, ordering and review of laboratory studies, ordering and review of radiographic studies, pulse oximetry and re-evaluation of patient's condition.  Glori Bickers, MD  9:27 PM

## 2019-03-16 NOTE — Progress Notes (Signed)
LVAD Coordinator Rounding Note:  Admitted 02/17/2019 per Dr. 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  HM III LVAD implanted on 08/16/17 by Dr. Prescott Gum  under Destination Therapy criteria.  Pt sedated and intubated. Wife at bedside. Plan of care discussed with Dr [name] and pt's wife at bedside.    Vital signs: Temp: 97.8 HR: 90 Doppler Pressure: 78-personally Art BP: 91/74 (83) O2 Sat: 90% on 100% FiO2 on vent Wt: 236.5>238.7>248.9>248.9>247>244.9>251>254>243.6>249.3>250.4 lbs   LVAD interrogation reveals:  Speed: 5100 Flow: 3.6 Power:  3.5w PI: 5.8 Alarms: none Events:  4 PI event today Hematocrit: 20 - do not adjust  Fixed speed: 5100  Low speed limit: 4800  Drive Line:  Dressing C/D/I; anchor intact and accurately applied. Weekly dressing changes per [facility] nurse; next dressing change due 03/21/19.  Labs:  LDH trend:  475>352>347>360>328>365>396>485>493>575>918>578>633  INR trend: 2.9>3.1>3.4>3.2>3.1>3.0>2.6>3.1>4.7>6.7>6.9>7.4>6.3  WBC trend: 10.0>12.8>15.2>12.7>14.2>16.0>13.5>15.8>28.5>17.9>21.3>20.2>17.7  AST trend: 141>490>288>254  ALT trend: 161>277>276>251  Anticoagulation Plan: -INR Goal: 2.0 - 2.5 -ASA Dose: 81 mg daily  Device:Medtronic 3 Lead Therapies: ON:  VF 200 bpm, FVT 200 - 240; VT 158 - 200 - ATP Therapies: AT/AF monitor on 171 bpm Pacing: DDD 60 Last check: 11/10/18  Cultures: 02/02/2019 - respiratory panel>> negative final 02/16/2019 - blood cxs> negative/final 04/01/2019- respiratory panel from bronch washings>> negative  03/15/2019- sputum cx>> no growth < 24hrs 03/21/2019- blood cx>> no growth <24hrs 03/09/2019- pneumocystis smear of bronch washings>> 03/27/2019- AFB culture/smear of bronch washings>>  Blood Products:  03/31/2019>> 1 FFP 03/15/19>> 1 FFP  Drips: Dopamine 3.5 mcg/kg/min Fentanyl 30 mcg/hr  Vasopressin 0.03 units/min  Respiratory: Intubated 03/13/19 Settings: 100% FiO2  Rate 22  Peep 10   TV 580  Plan/Recommendations:  1. Page VAD coordinator with any VAD equipment or drive line issues. 2. Weekly dressing changes per BS nurse; next change due 03/21/19.  Tanda Rockers RN [place] Coordinator  Office: [phone]  24/7 Pager: [phone]

## 2019-03-16 NOTE — Progress Notes (Signed)
ANTICOAGULATION CONSULT NOTE - [place] for warfarin Indication: atrial fibrillation/LVAD  Allergies  Allergen Reactions  . Penicillins Hives, Itching and Other (See Comments)    Did it involve swelling of the face/tongue/throat, SOB, or low BP? No Did it involve sudden or severe rash/hives, skin peeling, or any reaction on the inside of your mouth or nose? No Did you need to seek medical attention at a hospital or doctor's office? Unknown When did it last happen?childhood allergy If all above answers are "NO", may proceed with cephalosporin use.    Marland Kitchen Alcohol-Sulfur [Sulfur] Other (See Comments)    Burns skin    Patient Measurements: Height: 5\' 10"  (177.8 cm) Weight: 250 lb 7.1 oz (113.6 kg) IBW/kg (Calculated) : 73  Vital Signs: Temp: (P) 98.9 F (37.2 C) (01/14 0400) Temp Source: (P) Oral (01/14 0400) BP: 92/78 (01/13 2000) Pulse Rate: 85 (01/14 0340)  Labs: Recent Labs    03/25/2019 0332 03/18/2019 0537 03/21/2019 0721 03/15/19 0213 03/15/19 0213 03/15/19 0516 03/15/19 0516 03/15/19 0657 03/16/19 0247  HGB 11.5*   < >  --  11.7*   < > 13.3   < > 13.6 12.4*  HCT 36.9*   < >  --  37.3*   < > 39.0  --  40.0 41.0  PLT 83*   < > 80* 68*  --   --   --   --  57*  APTT  --   --  48*  --   --   --   --   --   --   LABPROT 63.5*   < > 60.1* 63.0*  --   --   --   --  56.1*  INR 7.4*   < > 6.9* 7.4*  --   --   --   --  6.3*  CREATININE 2.79*  --   --  2.40*  --   --   --   --  2.39*   < > = values in this interval not displayed.    Estimated Creatinine Clearance: 36.8 mL/min (A) (by C-G formula based on SCr of 2.39 mg/dL (H)).   Medical History: Past Medical History:  Diagnosis Date  . AICD (automatic cardioverter/defibrillator) present 02/05/2014   Upgrade to Medtronic biventricular ICD, serial number  [id number]   . Atrial flutter ([place]) 04/2012   s/p TEE-EPS+RFCA 04/2012  . CAD (coronary artery disease) [id number] X 2    RCA-T, 70% PL  (off CFX), 99% Prox LAD/90% Dist LAD, S/P TAXUS stent x 2  . CHF (congestive heart failure) ([place])   . Chronic anticoagulation   . Chronic systolic heart failure ([place])   . CKD (chronic kidney disease)   . Diabetic retinopathy ([place])   . DM type 2 (diabetes mellitus, type 2) (H[facility])    insulin dependent  . HTN (hypertension)   . Hypercholesteremia    ablation  . ICD (implantable cardiac defibrillator) in place   . Ischemic cardiomyopathy March 2015   20-25% 2D   . Nephrolithiasis   . 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   INR remains elevated, LDH stable but high, H/H stable and pltc down.  *PTA Warfarin dose= 2.5mg  daily  Goal of Therapy:  INR 2.0-2.5 Monitor platelets by anticoagulation protocol: Yes   Plan:  -Hold warfarin -Daily coags   Arrie Senate, PharmD, BCPS Clinical Pharmacist [phone] Please check  AMION for all [facility] numbers 03/16/2019

## 2019-03-16 NOTE — Progress Notes (Signed)
VAD flows dropped to 2.6. VSS. CVP 17. u/o 500 w/ 2 unmeasured occurrences due to leakages w/ condom cath. VAD coordinator paged.    VAD coordinator at bedside to assess pt- RN had adjusted HCT based on daily labs; once dropped to 20 flows improved to 3.3-3.4. Will continue to monitor.

## 2019-03-17 ENCOUNTER — Inpatient Hospital Stay (HOSPITAL_COMMUNITY): Payer: PPO

## 2019-03-17 DIAGNOSIS — R570 Cardiogenic shock: Secondary | ICD-10-CM

## 2019-03-17 LAB — POCT I-STAT 7, (LYTES, BLD GAS, ICA,H+H)
Acid-Base Excess: 2 mmol/L (ref 0.0–2.0)
Acid-base deficit: 5 mmol/L — ABNORMAL HIGH (ref 0.0–2.0)
Acid-base deficit: 4 mmol/L — ABNORMAL HIGH (ref 0.0–2.0)
Bicarbonate: 22.5 mmol/L (ref 20.0–28.0)
Bicarbonate: 23.3 mmol/L (ref 20.0–28.0)
Bicarbonate: 27.4 mmol/L (ref 20.0–28.0)
Calcium, Ion: 1.16 mmol/L (ref 1.15–1.40)
Calcium, Ion: 1.17 mmol/L (ref 1.15–1.40)
Calcium, Ion: 1.13 mmol/L — ABNORMAL LOW (ref 1.15–1.40)
HCT: 41 % (ref 39.0–52.0)
HCT: 43 % (ref 39.0–52.0)
HCT: 41 % (ref 39.0–52.0)
Hemoglobin: 13.9 g/dL (ref 13.0–17.0)
Hemoglobin: 14.6 g/dL (ref 13.0–17.0)
Hemoglobin: 13.9 g/dL (ref 13.0–17.0)
O2 Saturation: 91 %
O2 Saturation: 90 %
O2 Saturation: 94 %
Patient temperature: 99.1
Patient temperature: 39
Patient temperature: 103.6
Potassium: 4.8 mmol/L (ref 3.5–5.1)
Potassium: 4.9 mmol/L (ref 3.5–5.1)
Potassium: 4.7 mmol/L (ref 3.5–5.1)
Sodium: 155 mmol/L — ABNORMAL HIGH (ref 135–145)
Sodium: 157 mmol/L — ABNORMAL HIGH (ref 135–145)
Sodium: 159 mmol/L — ABNORMAL HIGH (ref 135–145)
TCO2: 24 mmol/L (ref 22–32)
TCO2: 25 mmol/L (ref 22–32)
TCO2: 29 mmol/L (ref 22–32)
pCO2 arterial: 52.7 mmHg — ABNORMAL HIGH (ref 32.0–48.0)
pCO2 arterial: 55.4 mmHg — ABNORMAL HIGH (ref 32.0–48.0)
pCO2 arterial: 51.6 mmHg — ABNORMAL HIGH (ref 32.0–48.0)
pH, Arterial: 7.239 — ABNORMAL LOW (ref 7.350–7.450)
pH, Arterial: 7.243 — ABNORMAL LOW (ref 7.350–7.450)
pH, Arterial: 7.345 — ABNORMAL LOW (ref 7.350–7.450)
pO2, Arterial: 73 mmHg — ABNORMAL LOW (ref 83.0–108.0)
pO2, Arterial: 78 mmHg — ABNORMAL LOW (ref 83.0–108.0)
pO2, Arterial: 86 mmHg (ref 83.0–108.0)

## 2019-03-17 LAB — GLUCOSE, CAPILLARY
Glucose-Capillary: 365 mg/dL — ABNORMAL HIGH (ref 70–99)
Glucose-Capillary: 317 mg/dL — ABNORMAL HIGH (ref 70–99)
Glucose-Capillary: 302 mg/dL — ABNORMAL HIGH (ref 70–99)
Glucose-Capillary: 264 mg/dL — ABNORMAL HIGH (ref 70–99)
Glucose-Capillary: 162 mg/dL — ABNORMAL HIGH (ref 70–99)
Glucose-Capillary: 132 mg/dL — ABNORMAL HIGH (ref 70–99)
Glucose-Capillary: 125 mg/dL — ABNORMAL HIGH (ref 70–99)
Glucose-Capillary: 150 mg/dL — ABNORMAL HIGH (ref 70–99)
Glucose-Capillary: 148 mg/dL — ABNORMAL HIGH (ref 70–99)

## 2019-03-17 LAB — COMPREHENSIVE METABOLIC PANEL
ALT: 204 U/L — ABNORMAL HIGH (ref 0–44)
AST: 208 U/L — ABNORMAL HIGH (ref 15–41)
Albumin: 1.8 g/dL — ABNORMAL LOW (ref 3.5–5.0)
Alkaline Phosphatase: 103 U/L (ref 38–126)
Anion gap: 15 (ref 5–15)
BUN: 148 mg/dL — ABNORMAL HIGH (ref 8–23)
CO2: 21 mmol/L — ABNORMAL LOW (ref 22–32)
Calcium: 8.3 mg/dL — ABNORMAL LOW (ref 8.9–10.3)
Chloride: 116 mmol/L — ABNORMAL HIGH (ref 98–111)
Creatinine, Ser: 2.82 mg/dL — ABNORMAL HIGH (ref 0.61–1.24)
GFR calc Af Amer: 25 mL/min — ABNORMAL LOW (ref >60)
GFR calc non Af Amer: 22 mL/min — ABNORMAL LOW (ref >60)
Glucose, Bld: 271 mg/dL — ABNORMAL HIGH (ref 70–99)
Potassium: 4.8 mmol/L (ref 3.5–5.1)
Sodium: 152 mmol/L — ABNORMAL HIGH (ref 135–145)
Total Bilirubin: 2.8 mg/dL — ABNORMAL HIGH (ref 0.3–1.2)
Total Protein: 7 g/dL (ref 6.5–8.1)

## 2019-03-17 LAB — POCT I-STAT EG7
Acid-base deficit: 3 mmol/L — ABNORMAL HIGH (ref 0.0–2.0)
Bicarbonate: 24.3 mmol/L (ref 20.0–28.0)
Calcium, Ion: 1.16 mmol/L (ref 1.15–1.40)
HCT: 41 % (ref 39.0–52.0)
Hemoglobin: 13.9 g/dL (ref 13.0–17.0)
O2 Saturation: 92 %
Patient temperature: 102.9
Potassium: 4.9 mmol/L (ref 3.5–5.1)
Sodium: 156 mmol/L — ABNORMAL HIGH (ref 135–145)
TCO2: 26 mmol/L (ref 22–32)
pCO2, Ven: 56.1 mmHg (ref 44.0–60.0)
pH, Ven: 7.257 (ref 7.250–7.430)
pO2, Ven: 83 mmHg — ABNORMAL HIGH (ref 32.0–45.0)

## 2019-03-17 LAB — CBC
HCT: 41.7 % (ref 39.0–52.0)
Hemoglobin: 12.5 g/dL — ABNORMAL LOW (ref 13.0–17.0)
MCH: 25.5 pg — ABNORMAL LOW (ref 26.0–34.0)
MCHC: 30 g/dL (ref 30.0–36.0)
MCV: 85.1 fL (ref 80.0–100.0)
Platelets: 50 10*3/uL — ABNORMAL LOW (ref 150–400)
RBC: 4.9 MIL/uL (ref 4.22–5.81)
RDW: 20.1 % — ABNORMAL HIGH (ref 11.5–15.5)
WBC: 13.8 10*3/uL — ABNORMAL HIGH (ref 4.0–10.5)
nRBC: 3.6 % — ABNORMAL HIGH (ref 0.0–0.2)

## 2019-03-17 LAB — LACTATE DEHYDROGENASE: LDH: 693 U/L — ABNORMAL HIGH (ref 98–192)

## 2019-03-17 LAB — RSV(RESPIRATORY SYNCYTIAL VIRUS) AB, BLOOD: RSV Ab: 1:8 {titer} — ABNORMAL HIGH

## 2019-03-17 LAB — HEMOGLOBIN FREE, PLASMA: Hgb, Plasma: 3.9 mg/dL (ref 0.0–4.9)

## 2019-03-17 LAB — MAGNESIUM: Magnesium: 3.4 mg/dL — ABNORMAL HIGH (ref 1.7–2.4)

## 2019-03-17 LAB — PROTIME-INR
INR: 9.5 (ref 0.8–1.2)
Prothrombin Time: 76.9 seconds — ABNORMAL HIGH (ref 11.4–15.2)

## 2019-03-17 LAB — LACTIC ACID, PLASMA: Lactic Acid, Venous: 5.1 mmol/L (ref 0.5–1.9)

## 2019-03-17 SURGERY — RIGHT HEART CATH
Anesthesia: LOCAL

## 2019-03-17 MED ORDER — FREE WATER: 100.0000 mL | Freq: Three times a day (TID) | Status: DC

## 2019-03-17 MED ORDER — DEXTROSE 50 % IV SOLN: 0.0000 mL | INTRAVENOUS | Status: DC | PRN

## 2019-03-17 MED ORDER — SODIUM CHLORIDE 0.9 % IV SOLN: INTRAVENOUS | Status: DC

## 2019-03-17 MED ORDER — DEXTROSE-NACL 5-0.45 % IV SOLN: INTRAVENOUS | Status: DC

## 2019-03-17 MED ORDER — SODIUM CHLORIDE 0.9 % IV SOLN
1.0000 g | Freq: Two times a day (BID) | INTRAVENOUS | Status: DC
  Filled 2019-03-17 (×2): qty 1

## 2019-03-17 MED ORDER — MIDAZOLAM HCL 2 MG/2ML IJ SOLN
INTRAMUSCULAR | Status: AC
  Filled 2019-03-17: qty 10

## 2019-03-17 MED ORDER — SODIUM BICARBONATE 8.4 % IV SOLN
INTRAVENOUS | Status: AC
  Filled 2019-03-17: qty 100

## 2019-03-17 MED ORDER — DIPHENHYDRAMINE HCL 50 MG/ML IJ SOLN
INTRAMUSCULAR | Status: AC
  Filled 2019-03-17: qty 1

## 2019-03-17 MED ORDER — VANCOMYCIN HCL 1250 MG/250ML IV SOLN: 1250.0000 mg | INTRAVENOUS | Status: DC

## 2019-03-17 MED ORDER — ACETAMINOPHEN 160 MG/5ML PO SOLN
650.0000 mg | ORAL | Status: DC | PRN
  Administered 2019-03-17: 650 mg
  Filled 2019-03-17: qty 20.3

## 2019-03-17 MED ORDER — INSULIN REGULAR(HUMAN) IN NACL 100-0.9 UT/100ML-% IV SOLN
INTRAVENOUS | Status: DC
  Administered 2019-03-17: 08:00:00 2.8 [IU]/h via INTRAVENOUS
  Administered 2019-03-17: 14 [IU]/h via INTRAVENOUS
  Filled 2019-03-17 (×2): qty 100

## 2019-03-17 MED ORDER — SODIUM BICARBONATE-DEXTROSE 150-5 MEQ/L-% IV SOLN
150.0000 meq | INTRAVENOUS | Status: DC
  Administered 2019-03-17: 10:00:00 150 meq via INTRAVENOUS
  Filled 2019-03-17 (×2): qty 1000

## 2019-03-17 MED ORDER — SODIUM BICARBONATE 8.4 % IV SOLN: 100.0000 meq | Freq: Once | INTRAVENOUS | Status: AC

## 2019-03-17 MED ORDER — MIDAZOLAM 50MG/50ML (1MG/ML) PREMIX INFUSION
0.5000 mg/h | INTRAVENOUS | Status: DC
  Filled 2019-03-17: qty 50

## 2019-03-17 MED ORDER — SODIUM BICARBONATE 8.4 % IV SOLN
INTRAVENOUS | Status: AC
  Administered 2019-03-17: 09:00:00 100 meq via INTRAVENOUS
  Filled 2019-03-17: qty 100

## 2019-03-18 NOTE — Progress Notes (Signed)
ICD remote 

## 2019-03-19 LAB — CULTURE, RESPIRATORY W GRAM STAIN

## 2019-03-19 LAB — CULTURE, BLOOD (ROUTINE X 2)
Culture: NO GROWTH
Culture: NO GROWTH
Special Requests: ADEQUATE
Special Requests: ADEQUATE

## 2019-03-21 ENCOUNTER — Telehealth (HOSPITAL_COMMUNITY): Payer: Self-pay | Admitting: Licensed Clinical Social Worker

## 2019-03-21 NOTE — Telephone Encounter (Signed)
CSW received call form patient's daughter and shared appreciation for the VAD team. She states that patient's wife is coping with "good moments and some bad". CSW provided supportive intervention and grief resources if needed. Daughter appreciative of the support and will return call to CSW if needed. Raquel Sarna, [place], [place]

## 2019-03-31 LAB — PNEUMOCYSTIS JIROVECI SMEAR BY DFA: Pneumocystis jiroveci Ag: NEGATIVE

## 2019-04-03 NOTE — Death Summary Note (Signed)
Advanced Heart Failure Team  Death Summary   Patient ID: [name] [name] MRN: [id number], DOB/AGE: [birth year] 69 y.o. Admit date: 02/13/2019 D/C date:     04-13-2019   Primary Discharge Diagnoses:  1. Acute hypoxic respiratory failure 2. Septic shock 3. Pneumonia 4. Amiodarone-induced ung toxicity 5. Acute on chronic systolic HF s/p LVAD 6. AKI 7. Permanent AF   Hospital Course:  [name] a 69 y.o.malewith h/o chronic systolic CHF due to ICM, s/p LVAD HM3 08/16/2017, [name], [name] admitted 01/31/2019 with several week h/o worsening respiratory failure due to presumed amiodarone lung toxicity.   On admit, ESR elevated and CT chest c/w amio lung toxicity. Covid negative. Seen by P/CCCM as well. Started on IV steroids and IV lasix. Initially had some improvement in respiratory status but then deteriorated with worsening respiratory failure and multisystem organ failure with AKI, liver failure and markedly elevated LDH. On 1/11 required endotracheal intubation for worsening hypoxia despite BIPAP therapy. On 1/12 underwent bronch by CCM team which was essentially unremarkable. Placed on broad spectrum abx and VP/dopamine for presumed PNA and sepsis.   Unfortunately, despite all aggressive measures on 1/15 he continued to deteriorate with profound acidosis and progress hypoxemia despite full vent support.   I discussed the situation with his family and the decision was made to transition to comfort care. His ICD was deactivated and his pressors were stopped. He was then extubated and VAD deactivated. He passed at 1410 with his family and the chaplain at the bedside.    Treasa School MD 04/13/2019, 2:28 PM

## 2019-04-03 NOTE — Progress Notes (Signed)
CRITICAL VALUE ALERT  Critical Value:  Lactic 5.1  D ate & Time Notied: Observed in labs. See labs  Provider Notified: Mechele Collin, RN from [place] Received/Actions taken: Waiting for orders.

## 2019-04-03 NOTE — Progress Notes (Signed)
Inpatient Diabetes Program Recommendations  AACE/ADA: New Consensus Statement on Inpatient Glycemic Control (2015)  Target Ranges:  Prepandial:   less than 140 mg/dL      Peak postprandial:   less than 180 mg/dL (1-2 hours)      Critically ill patients:  140 - 180 mg/dL   Lab Results  Component Value Date   GLUCAP 125 (H) 2019-03-21   HGBA1C 9.4 (H) 02/11/2019    Review of Glycemic Control Results for [name] [name], [name] [name] (MRN [id number]) as of 03-21-19 10:44  Ref. Range 21-Mar-2019 04:48 03/21/2019 07:06 03/21/19 08:57 03/21/19 09:59  Glucose-Capillary Latest Ref Range: 70 - 99 mg/dL 264 (H) 162 (H) 132 (H) 125 (H)   Diabetes history: Type 2 DM Outpatient Diabetes medications: Novolog 70/30 40 units QAM Current orders for Inpatient glycemic control: Lantus 10 units QD, Novolog 0-15 units Q4H Solumedrol 60 mg Q6H Vital @ 65 ml/hr  Inpatient Diabetes Program Recommendations:    Noted glucose trends increased and IV insulin started. Assuming related to order entry: tube feed order placed TID instead of Q4H and BID dosing for basal not placed.   When ready to transition consider: -Levemir 15 units two hours prior to discontinuation of IV insulin, then BID to follow. -Novolog 0-15 units Q4H -Novolog 5 units Q4H for tube feed coverage (to be stopped or held in the event tube feeds are stopped).   Thanks, Bronson Curb, MSN, RNC-OB Diabetes Coordinator [phone] (8a-5p)

## 2019-04-03 NOTE — Progress Notes (Signed)
Patient ID: [name] [name], male   DOB: 1950/11/21, 69 y.o.   MRN: [id number]   Advanced Heart Failure VAD Team Note  PCP-Cardiologist: No primary care provider on file.   Subjective:    Remains on vent at 100%. Now with worsening MSOF with lactate 5.1 and rising creatinine. INR 9. Febrile to 103.6 despite meropenem and vanc. On VP, dopamine and NE  LVAD INTERROGATION:  HeartMate 3 LVAD:   Flow 3.4 liters/min, speed 5100, power 3.0 PI 3.3.  Frequent PIs   Objective:    Vital Signs:   Temp:  [97.7 F (36.5 C)-102 F (38.9 C)] 102 F (38.9 C) (01/15 0445) Pulse Rate:  [84-235] 119 (01/15 0824) Resp:  [14-38] 28 (01/15 0824) BP: (103)/(85) 103/85 (01/14 1202) SpO2:  [70 %-95 %] 95 % (01/15 0700) Arterial Line BP: (76-97)/(56-83) 77/56 (01/15 0700) FiO2 (%):  [100 %] 100 % (01/15 0824) Weight:  [113.2 kg] 113.2 kg (01/15 0630) Last BM Date: 03/06/2019 Mean arterial Pressure 70s on VP, DA and NE   Intake/Output:   Intake/Output Summary (Last 24 hours) at 03-26-19 1117 Last data filed at 03/26/2019 1000 Gross per 24 hour  Intake 2742.78 ml  Output 1225 ml  Net 1517.78 ml     Physical Exam   Physical Exam: General:  Critically ill [name], remains intubated and sedated  HEENT: normal  Neck: supple. JVP to jaw Carotids 2+ bilat; no bruits. No lymphadenopathy or thryomegaly appreciated. Cor: LVAD hum.  Lungs: Coarse Abdomen: obese soft, nontender, non-distended. No hepatosplenomegaly. No bruits or masses. Good bowel sounds. Driveline site clean. Anchor in place.  Extremities: no cyanosis, clubbing, rash. Warm no 2+ edema  Neuro: sedated on vent nresponsive   Telemetry   AF 90-120 Personally reviewed   Labs   Basic Metabolic Panel: Recent Labs  Lab 03/11/19 0035 03/11/19 1644 03/12/19 0500 03/13/19 0238 03/13/19 0356 03/13/19 0449 03/30/2019 0332 03/16/2019 0537 03/15/19 0213 03/15/19 0516 03/15/19 0657 03/15/19 1141 03/15/19 1644 03/16/19 0247 03/16/19 0831  03/16/19 1304 03/16/19 1732 26-Mar-2019 0335 26-Mar-2019 0431 2019/03/26 0448 03/26/2019 0746 03-26-2019 1002  NA 138   < >   < >  --  140   < > 140   < > 145   < >   < >  --   --  147*   < >   < >  --  155* 152* 157* 156* 159*  K 4.7   < >   < >  --  4.4   < > 4.8   < > 5.2*   < >   < >  --   --  5.4*   < >   < >  --  4.8 4.8 4.9 4.9 4.7  CL 103   < >   < >  --  103  --  104  --  109  --   --   --   --  110  --   --   --   --  116*  --   --   --   CO2 22   < >   < >  --  24  --  24  --  26  --   --   --   --  25  --   --   --   --  21*  --   --   --   GLUCOSE 204*   < >   < >  --  195*  --  194*  --  217*  --   --   --   --  316*  --   --   --   --  271*  --   --   --   BUN 89*   < >   < >  --  107*  --  122*  --  117*  --   --   --   --  128*  --   --   --   --  148*  --   --   --   CREATININE 2.02*   < >   < >  --  2.16*  --  2.79*  --  2.40*  --   --   --   --  2.39*  --   --   --   --  2.82*  --   --   --   CALCIUM 8.6*   < >   < >  --  8.8*   < > 8.5*   < > 8.8*  --   --   --   --  8.8*  --   --   --   --  8.3*  --   --   --   MG 2.9*   < >  --  3.0*  --   --  3.0*  --  3.1*  --   --   --   --  3.3*  --   --   --   --  3.4*  --   --   --   PHOS 4.3  --   --   --   --   --   --   --   --   --   --  4.8* 5.0* 5.1*  --   --  4.9*  --   --   --   --   --    < > = values in this interval not displayed.    Liver Function Tests: Recent Labs  Lab 03/13/19 0356 03/21/2019 0332 03/15/19 0213 03/16/19 0247 29-Mar-2019 0431  AST 141* 490* 288* 254* 208*  ALT 161* 277* 276* 251* 204*  ALKPHOS 100 87 93 103 103  BILITOT 1.6* 2.4* 2.7* 3.1* 2.8*  PROT 6.6 6.5 6.8 7.1 7.0  ALBUMIN 2.0* 1.8* 1.8* 2.0* 1.8*   No results for input(s): LIPASE, AMYLASE in the last 168 hours. No results for input(s): AMMONIA in the last 168 hours.  CBC: Recent Labs  Lab 03/11/19 0035 03/11/19 1644 03/13/19 0454 03/13/19 0616 03/10/2019 0332 03/13/2019 0537 03/28/2019 0721 03/15/19 0213 03/15/19 0516 03/16/19 0247  03/16/19 0831 2019/03/29 0335 03-29-19 0431 03-29-2019 0448 March 29, 2019 0746 03/29/2019 1002  WBC 26.4*  --  17.9*  --  21.3*  --   --  20.2*  --  17.7*  --   --  13.8*  --   --   --   NEUTROABS 24.9*  --   --   --   --   --   --   --   --   --   --   --   --   --   --   --   HGB 12.3*   < > 11.9*   < > 11.5*   < >  --  11.7*   < > 12.4*   < > 13.9 12.5* 14.6 13.9 13.9  HCT 37.5*   < > 37.5*   < >  36.9*   < >  --  37.3*   < > 41.0   < > 41.0 41.7 43.0 41.0 41.0  MCV 79.8*  --  81.0  --  81.1  --   --  81.1  --  83.7  --   --  85.1  --   --   --   PLT 213  --  108*   < > 83*  --  80* 68*  --  57*  --   --  50*  --   --   --    < > = values in this interval not displayed.    INR: Recent Labs  Lab 03/13/2019 0332 03/16/2019 0721 03/15/19 0213 03/16/19 0247 04/16/19 0431  INR 7.4* 6.9* 7.4* 6.3* 9.5*    Other results:     Imaging   DG Chest Port (Stat)  Result Date: 04/16/2019 CLINICAL DATA:  Intubation. EXAM: PORTABLE CHEST 1 VIEW COMPARISON:  Chest x-ray 03/16/2019, 03/15/2019, 03/04/2019, 08/15/2017. CT 03/01/2019. FINDINGS: Endotracheal tube, NG tube, right PICC line in stable position. Large caliber cannula noted over the left neck with tip projected over the SVC. AICD and left ventricular assist device in stable position. Prior median sternotomy. Heart size stable. Bilateral diffuse interstitial prominence again noted and unchanged from multiple prior exams. Rounded lucency over the left lung base is again noted. Tiny left pleural effusion cannot be excluded. No pneumothorax. IMPRESSION: 1. Large caliber cannula noted over the left neck with tip projected over the SVC. Remaining lines and tubes in stable position. AICD left ventricular assist device in stable position. Prior median sternotomy. Heart size stable. 2. Diffuse bilateral interstitial prominence is again noted, unchanged from multiple prior exams. Tiny left pleural effusion cannot be excluded. 3. Rounded lucency over the left lung  base is again noted. This could represent a small pneumatocele. Electronically Signed   By: Marcello Moores  Register   On: April 16, 2019 06:34   DG Chest Port (Stat)  Result Date: 03/16/2019 CLINICAL DATA:  Endotracheal intubation. EXAM: PORTABLE CHEST 1 VIEW COMPARISON:  March 15, 2019. FINDINGS: Stable cardiomediastinal silhouette. Endotracheal and nasogastric tubes are unchanged in position. Left ventricular assist device is unchanged in position. Left-sided pacemaker is unchanged in position. No pneumothorax is noted. Stable mild diffuse interstitial densities are noted concerning for edema or possibly inflammation. Small bilateral pleural effusions may be present. Bony thorax is unremarkable. IMPRESSION: Stable support apparatus. Stable mild diffuse interstitial densities are noted concerning for edema or possibly inflammation. Small bilateral pleural effusions may be present. Electronically Signed   By: Marijo Conception M.D.   On: 03/16/2019 07:34     Medications:     Scheduled Medications: . sodium chloride   Intravenous Once  . aspirin  81 mg Per Tube Daily  . chlorhexidine gluconate (MEDLINE KIT)  15 mL Mouth Rinse BID  . Chlorhexidine Gluconate Cloth  6 each Topical Daily  . feeding supplement (PRO-STAT SUGAR FREE 64)  30 mL Per Tube TID  . free water  100 mL Per Tube Q8H  . insulin glargine  10 Units Subcutaneous Daily  . mouth rinse  15 mL Mouth Rinse 10 times per day  . methylPREDNISolone (SOLU-MEDROL) injection  60 mg Intravenous Q6H  . pantoprazole sodium  40 mg Per Tube Daily  . sodium bicarbonate      . sodium chloride flush  10-40 mL Intracatheter Q12H  . sodium chloride flush  10-40 mL Intracatheter Q12H  . sodium chloride flush  3  mL Intravenous Q12H  . sodium chloride flush  3 mL Intravenous Q12H  . Warfarin - Pharmacist Dosing Inpatient   Does not apply q1800    Infusions: . sodium chloride Stopped (03/16/19 1143)  . sodium chloride    . sodium chloride    . sodium  chloride    . sodium chloride 75 mL/hr at 03/19/2019 0800  . dextrose 5 % and 0.45% NaCl    . DOPamine 3.5 mcg/kg/min (19-Mar-2019 1000)  . feeding supplement (VITAL AF 1.2 CAL) 1,000 mL (03/16/19 1729)  . fentaNYL infusion INTRAVENOUS 75 mcg/hr (2019-03-19 1000)  . insulin 3.4 mL/hr at 03/19/19 1000  . meropenem (MERREM) IV Stopped (03/19/19 0527)  . norepinephrine (LEVOPHED) Adult infusion    . sodium bicarbonate 150 mEq in dextrose 5% 1000 mL 150 mL/hr at 2019/03/19 1000  . vancomycin Stopped (03/16/19 1328)  . vasopressin (PITRESSIN) infusion - *FOR SHOCK* 0.04 Units/min (03/19/2019 1000)    PRN Medications: sodium chloride, sodium chloride, acetaminophen, dextrose, fentaNYL (SUBLIMAZE) injection, fentaNYL (SUBLIMAZE) injection, midazolam, ondansetron (ZOFRAN) IV, sodium chloride flush, sodium chloride flush, sodium chloride flush, sodium chloride flush   Assessment/Plan:    1. Acute hypoxemic respiratory failure: Recently seen in the ER and now admitted from clinic with marked hypoxemic requiring NRBM.  CXR with diffuse bilateral interstitial and airspace disease.  12/27 ESR elevated to 80 => 68 this admission with CRP 23.  PCT elevated mildly 0.67 => 0.7.  Respiratory virus and COVID-19 negative.  Blood cultures NGTD.  Afebrile, WBCs 21K on steroids.  Also noted to be volume overloaded on exam with significant JVD, weight gain, and peripheral edema. High resolution CT chest with diffuse GGO that could be consistent with amiodarone toxicity versus infection.  Autoimmune panel negative.  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  Amiodarone has been stopped and he has been started on steroids.  - Decompensated on 1/7 moved to ICU. Worsening respiratory status and Intubated 11/11 - Initial PCT 0.7, increased to  5. Started vanc/meropenem for possible PNA on 1/8. CXR unchanged. Repeat PCT down to 2.43 today.  - S/p bronchoscopy 1/12 for  diagnostic eval of airways + specimen collection for culture. NGTD  -on methylprednisolone 60 mg IV Q6H.  - continue vanc + meropenem  He continues to deteriorate with high fevers, severe respiratory failure despite vent support and MSOF. He has little or no chance of survival. Discussed options with family and have suggested that comfort care likely only real option. They are discussing and will let us know. Appreciate CCM input as well who agree that aggressive measures unlike to change outcome.    2. 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  MDT CRT-D device.  LVAD parameters stable on interrogation.  - He has severe RV failure. VAD working properly - on triple pressors. Had planned for RHC today but no role for this currently  3. Atrial fibrillation permanent:  - rate up in setting of critical illness  4. AKI on CKD stage 3:  - creatinine trending back down, 2.79>>2.39 - BUN now up to 128. K 5.3 Suspect he is uremic as well.  - May ultimately need CVVHD  5. Diabetes: Blood glucose up with steroids.  - glucose very high -> insulin gtt  6. Transaminitis/liver failure - due to shock    CRITICAL CARE Performed by: Glori Bickers  Total critical care time: 45 minutes  Critical care time was exclusive of separately  billable procedures and treating other patients.  Critical care was necessary to treat or prevent imminent or life-threatening deterioration.  Critical care was time spent personally by me (independent of midlevel providers or residents) on the following activities: development of treatment plan with patient and/or surrogate as well as nursing, discussions with consultants, evaluation of patient's response to treatment, examination of patient, obtaining history from patient or surrogate, ordering and performing treatments and interventions, ordering and review of laboratory studies, ordering and review of radiographic studies, pulse oximetry and re-evaluation  of patient's condition.   Length of Stay: 17  Glori Bickers, MD 04-13-19, 11:17 AM  VAD Team --- VAD ISSUES ONLY--- Pager [phone] (7am - 7am)  Advanced Heart Failure Team  Pager [phone] (M-F; 7a - 4p)  Please contact [facility] for night-coverage after hours (4p -7a ) and weekends on [email or web address]

## 2019-04-03 NOTE — Progress Notes (Signed)
  Family, VAD team, bedside RN and Chaplain at bedside. Desire to stop aggressive measures and for transition to full comfort care reaffirmed.   Patient was further sedated to ensure comfort.   I deactivated his ICD and drips stopped. I then personally extubated him and his VAD was turned off.  He passed quietly several minutes later.   Additional CCT 40 mins.   Glori Bickers, MD  2:28 PM

## 2019-04-03 NOTE — Progress Notes (Signed)
CSW met at bedside with family and provided support. Family appear supported by each other and grateful to the VAD team. CSW informed wife of availability if needed for grief counseling/resources. Raquel Sarna, [place], [place]

## 2019-04-03 NOTE — Progress Notes (Signed)
Patient's systolic blood pressure decreased to 70s. Levophed turned back on. Heart rate increased to the 140s. EKG showed afib with RVR. On call cardiologist called. Orders given to increase vasopressin. See orders.

## 2019-04-03 NOTE — Progress Notes (Signed)
RN called Dr. Lucile Shutters about new ABG results and patient's increased temperature. RN waiting for orders.

## 2019-04-03 NOTE — Progress Notes (Signed)
CRITICAL VALUE ALERT  Critical Value:  INR 9.5  Date & Time Notied:  April 08, 2019 0737  Provider Notified: [email or web address]  Orders Received/Actions taken: MD paged; awaiting return call.

## 2019-04-03 NOTE — Progress Notes (Signed)
Pharmacy Antibiotic Note  [name] [name] is a 69 y.o. male admitted on 02/16/2019 with pneumonia.  Pharmacy has been consulted for vancomycin and meropenem dosing.  Cr remains elevated, UOP low.  Plan: -Reduce vancomycin to 1250mg  IV q48h -Reduce meropenem to 1g IV q12h   Height: 5\' 10"  (177.8 cm) Weight: 249 lb 9 oz (113.2 kg) IBW/kg (Calculated) : 73  Temp (24hrs), Avg:99.8 F (37.7 C), Min:97.7 F (36.5 C), Max:102 F (38.9 C)  Recent Labs  Lab 03/11/19 0035 03/13/19 0356 03/13/19 0454 03/26/2019 0332 03/28/2019 1020 03/19/2019 1027 03/15/19 0213 03/15/19 0924 03/16/19 0245 03/16/19 0247 Mar 19, 2019 0431  WBC   < >  --  17.9* 21.3*  --   --  20.2*  --   --  17.7* 13.8*  CREATININE  --  2.16*  --  2.79*  --   --  2.40*  --   --  2.39* 2.82*  LATICACIDVEN  --   --   --   --   --  3.1*  --  3.1* 3.3*  --  5.1*  VANCORANDOM  --   --   --   --  16  --   --   --   --   --   --    < > = values in this interval not displayed.    Estimated Creatinine Clearance: 31.2 mL/min (A) (by C-G formula based on SCr of 2.82 mg/dL (H)).    Allergies  Allergen Reactions  . Penicillins Hives, Itching and Other (See Comments)    Did it involve swelling of the face/tongue/throat, SOB, or low BP? No Did it involve sudden or severe rash/hives, skin peeling, or any reaction on the inside of your mouth or nose? No Did you need to seek medical attention at a hospital or doctor's office? Unknown When did it last happen?childhood allergy If all above answers are "NO", may proceed with cephalosporin use.    Marland Kitchen Alcohol-Sulfur [Sulfur] Other (See Comments)    Burns skin    Antimicrobials this admission: Vancomycin 1/8 >>  Meropenem 1/8 >>    Microbiology results: 12/29 BCx: NGTD 12/29 Resp PCR: neg  1/8 Sputum: ordered  1/8 MRSA PCR: neg  Thank you for allowing pharmacy to be a part of this patient's care.  Arrie Senate, PharmD, BCPS Clinical Pharmacist [phone] Please check  AMION for all [facility] numbers 03/19/19

## 2019-04-03 NOTE — Progress Notes (Signed)
Chaplain provided prayer and support to [name] [name]' family at his bedside.  Family expressed gratefulness for having chaplain present.   Chaplain gave family patient placements number.  Family provided funeral home information which is: [facility] [place], [place] [phone].  Chaplain will give information to nurse.    Chaplain will continue to follow-up.

## 2019-04-03 NOTE — Progress Notes (Signed)
  Long talk with family. Have decided to proceed with comfort care this afternoon.   Glori Bickers, MD  1:43 PM

## 2019-04-03 NOTE — Progress Notes (Signed)
[facility] Progress Note Patient Name: [name] [name] DOB: 06-29-1950 MRN: [id number]   Date of Service  2019-03-18  HPI/Events of Note  Moderate respiratory acidosis  eICU Interventions  RR increased to 28        Laylonie Marzec U Lennyx Verdell Mar 18, 2019, 5:39 AM

## 2019-04-03 NOTE — Progress Notes (Signed)
210 cc fentanyl wasted in sharps. Robinette Haines, RN witness to waste.

## 2019-04-03 NOTE — Progress Notes (Signed)
The decision to transition to comfort care was made. Fentanyl bolus and versed pushes were given per MD instruction to ensure comfort prior to extubation. The patient expired at 1410. Pronounced by MD at bedside.

## 2019-04-03 NOTE — Progress Notes (Signed)
NAME:  [name] [name], MRN:  [id number], DOB:  Jul 28, 1950, LOS: 34 ADMISSION DATE:  02/04/2019, CONSULTATION DATE:  02/14/2019 REFERRING MD:  Bensimhon, CHIEF COMPLAINT:  hypoxemia  Brief History   69 year old male with extensive cardiac history with LVAD and PCCM consulted for acute Hypoxemia.  History of present illness   69 yo M with extensive cardiac history with LVAD presenting with acute onset hypoxia possibly concerning for HF exacerbation vs amiodarone toxicity vs unknown process. COVID negative x 3. 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  Past Medical History  He has a past medical history of AICD (automatic cardioverter/defibrillator) present (02/05/2014), Atrial flutter ([place]) (04/2012), CAD (coronary artery disease) (4098,1191 X 2 ), CHF (congestive heart failure) ([place]), Chronic anticoagulation, Chronic systolic heart failure ([place]), CKD (chronic kidney disease), Diabetic retinopathy ([place]), DM type 2 (diabetes mellitus, type 2) ([place]), HTN (hypertension), Hypercholesteremia, ICD (implantable cardiac defibrillator) in place, Ischemic cardiomyopathy (March 2015), Nephrolithiasis, and Ventricular tachycardia ([place]).  Significant Hospital Events   12/29 Admit to HF service 12/30 High RES CT shows diffuse patchy ground glass - see imaging 1/2 Improved oxygenation to 10L O2 1/4 on Bipap 1/9 HF [place], QHS Bipap 1/11 Intubation   Consults:  Heart failure  Procedures:  1/9 Art line 1/11 Intubation 1/12 Bronchoscopy   Significant Diagnostic Tests:  12/29 CT Chest High Resolution IMPRESSION: 1. Diffuse patchy pulmonary parenchymal ground-glass, scattered consolidation and suspected mild architectural distortion, with a slight upper/midlung zone predominance. Findings are new/progressive from 05/03/2018 and can be seen with amiodarone toxicity. Pneumonia is not excluded. 2. Trace bilateral pleural effusions. 3. Increased  prominence of mediastinal lymph nodes, likely reactive. 4. Liver margin may be slightly irregular, raising suspicion for cirrhosis. 5. Gallbladder sludge and/or stones. 6.  Aortic atherosclerosis (ICD10-I70.0). 7. Enlarged pulmonic trunk, indicative of pulmonary arterial  1/12 RUQ U/S IMPRESSION: 1. Gallbladder sludge and small stones. No definite stone graphic findings of acute cholecystitis. 2. Fatty liver.  1/15 CXR> diffuse bilateral interstitial prominence. Possible small L pleural effusion.   Micro Data:  12/27 SARS2 >> neg 12/29 RVP >> Negative 12/29 SARS 2 >> Negative 12/29 MRSA PCR >> neg 1/12 Blood cultures - NGTD at 24hrs 1/13 Bronch alveolar lavage - No organism - few WBC  Antimicrobials:  Vancomycin 1/8 --> Meropenem 1/8 -->  Interim history/subjective:  Requiring escalating pressor doses overnight Insulin gtt started overnight for glucose >400 RR increased for respiratory acidosis  INR interval increase to 9   Desaturation this morning on fairly high vent settings already    Objective   Blood pressure 103/85, pulse (!) 119, temperature (!) 102 F (38.9 C), temperature source Oral, resp. rate (!) 28, height _0  (1.778 m), weight 113.2 kg, SpO2 95 %. CVP:  [15 mmHg-17 mmHg] 15 mmHg  Vent Mode: PRVC FiO2 (%):  [100 %] 100 % Set Rate:  [22 bmp-28 bmp] 28 bmp Vt Set:  [580 mL] 580 mL PEEP:  [10 cmH20] 10 cmH20 Plateau Pressure:  [33 cmH20-34 cmH20] 34 cmH20   Intake/Output Summary (Last 24 hours) at Mar 30, 2019 1014 Last data filed at 03-30-19 0700 Gross per 24 hour  Intake 2570.54 ml  Output 650 ml  Net 1920.54 ml   Filed Weights   03/12/19 0446 03/12/2019 0400 March 30, 2019 0630  Weight: 113.1 kg 113.6 kg 113.2 kg    Examination: General: Critically ill appearing older adult M, intubated, sedated NAD  HENT: NCAT anicteric sclera. Pink mmm. Trachea midline. ETT secure.  Tan secretions from ETT.  Lungs: Coarse lung sounds  Cardiovascular: LVAD hum    Abdomen: soft, round, hypoactive x4  Extremities: 1+ pitting edema BLE No obvious joint deformity no cyanosis or clubbing  Neuro: Sedated. Does not awaken to noxious stimuli or move to noxious stimuli. 75m pupils.   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  P:  -RHC intended 1/15 however this is cancelled due to coagulopathy  -Vasopressin, dopamine per HF. Off NE at present   Elevated LFT's - RUQ with sludge,small stones, fatty liver, but neg for acute cholecystitis Coagulopathy P: -2FFP ordered by HF -Trend LFTs, coags   AKI on CKD IIIB: baseline cr around 1.5-2 -interval increase in Cr, with progressively decreasing UOP  P:   Trend renal indices  Strict I/O I worry that patient's renal function will continue to decline which may prompt consideration of CRRT. I do worry that outcomes for this patient may not improve even with addition of further aggressive measures such as CRRT  If RRT is desired, coagulopathy (INR > 9) must be further corrected before procedure attempted   Hypernatremia -adding FWF [id number] -trend BMP  Hyperglycemia -insulin gtt  -Will continue insulin gtt while EN is added back due to cancelled RHCHannahGoals of Care: The patient has acutely decompensated from multisystem organ dysfunction including cardiogenic shock, hypoxemic respiratory failure, worsening renal function and liver dysfunction with worsening coagulopathy  -I worry that we are reaching a critical juncture in this patient's care. After a discussion with  Dr. BenHaroldine LawsF) the family is deciding if they would like to continue aggressive care or transition to comfort measures. I do worry that adding further aggressive measures would not be of benefit for this patient and would prolong suffering.   Best practice:  Diet: EN Pain/Anxiety/Delirium protocol (if indicated): Fentanyl, PRN versed  VAP protocol (if indicated): Vap protocol  DVT prophylaxis: Warfarin GI prophylaxis: Protonix Glucose control: insulin gtt Mobility: Up with assistance  Code Status: Partial  Family Communication: Wife updated by Heart Failure MD.  Disposition: ICU  Labs   CBC: Recent Labs  Lab 03/11/19 0035 03/11/19 1644 03/13/19 0454 03/13/19 0616 03/18/2019 0332 03/07/2019 0537 03/25/2019 0721 03/15/19 0213 03/15/19 0516 03/16/19 0247 03/16/19 0831 03/16/19 1304 01/25-Jan-202135 01/January 25, 202131 03/03/23/202148 01/01-25-2146  WBC 26.4*  --  17.9*  --  21.3*  --   --  20.2*  --  17.7*  --   --   --  13.8*  --   --   NEUTROABS 24.9*  --   --   --   --   --   --   --   --   --   --   --   --   --   --   --   HGB 12.3*   < > 11.9*   < > 11.5*   < >  --  11.7*   < > 12.4*   < > 13.6 13.9 12.5* 14.6 13.9  HCT 37.5*   < > 37.5*   < >  36.9*   < >  --  37.3*   < > 41.0   < > 40.0 41.0 41.7 43.0 41.0  MCV 79.8*  --  81.0  --  81.1  --   --  81.1  --  83.7  --   --   --  85.1  --   --   PLT 213  --  108*   < > 83*  --  80* 68*  --  57*  --   --   --  50*  --   --    < > = values in this interval not displayed.    Basic Metabolic Panel: Recent Labs  Lab 03/11/19 0035 03/11/19 1644 03/12/19 0500 03/13/19 0238 03/13/19 0356 03/13/19 0449 03/18/2019 0332 03/04/2019 0537 03/15/19 0213 03/15/19 0516 03/15/19 0657 03/15/19 1141 03/15/19 1644 03/16/19 0247 03/16/19 0831 03/16/19 1304 03/16/19 1732 Apr 10, 2019 0335 Apr 10, 2019 0431 Apr 10, 2019 0448 04/10/19 0746  NA 138   < >   < >  --  140   < > 140   < > 145   < >   < >  --   --  147*   < > 153*  --  155* 152* 157* 156*    K 4.7   < >   < >  --  4.4   < > 4.8   < > 5.2*   < >   < >  --   --  5.4*   < > 4.9  --  4.8 4.8 4.9 4.9  CL 103   < >   < >  --  103  --  104  --  109  --   --   --   --  110  --   --   --   --  116*  --   --   CO2 22   < >   < >  --  24  --  24  --  26  --   --   --   --  25  --   --   --   --  21*  --   --   GLUCOSE 204*   < >   < >  --  195*  --  194*  --  217*  --   --   --   --  316*  --   --   --   --  271*  --   --   BUN 89*   < >   < >  --  107*  --  122*  --  117*  --   --   --   --  128*  --   --   --   --  148*  --   --   CREATININE 2.02*   < >   < >  --  2.16*  --  2.79*  --  2.40*  --   --   --   --  2.39*  --   --   --   --  2.82*  --   --   CALCIUM 8.6*   < >   < >  --  8.8*  --  8.5*  --  8.8*  --   --   --   --  8.8*  --   --   --   --  8.3*  --   --   MG 2.9*   < >  --  3.0*  --   --  3.0*  --  3.1*  --   --   --   --  3.3*  --   --   --   --  3.4*  --   --   PHOS 4.3  --   --   --   --   --   --   --   --   --   --  4.8* 5.0* 5.1*  --   --  4.9*  --   --   --   --    < > = values in this interval not displayed.   GFR: Estimated Creatinine Clearance: 31.2 mL/min (A) (by C-G formula based on SCr of 2.82 mg/dL (H)). Recent Labs  Lab 03/12/19 0500 03/13/19 0454 03/13/2019 0332 03/16/2019 0935 03/21/2019 1027 03/15/19 0213 03/15/19 0924 03/16/19 0245 03/16/19 0247 2019-03-20 0431  PROCALCITON 3.81  --   --  2.54  --  2.42  --   --  2.43  --   WBC  --    < > 21.3*  --   --  20.2*  --   --  17.7* 13.8*  LATICACIDVEN  --   --   --   --  3.1*  --  3.1* 3.3*  --  5.1*   < > = values in this interval not displayed.    Liver Function Tests: Recent Labs  Lab 03/13/19 0356 03/29/2019 0332 03/15/19 0213 03/16/19 0247 03/20/2019 0431  AST 141* 490* 288* 254* 208*  ALT 161* 277* 276* 251* 204*  ALKPHOS 100 87 93 103 103  BILITOT 1.6* 2.4* 2.7* 3.1* 2.8*  PROT 6.6 6.5 6.8 7.1 7.0  ALBUMIN 2.0* 1.8* 1.8* 2.0* 1.8*   No results for input(s): LIPASE, AMYLASE in the last 168  hours. No results for input(s): AMMONIA in the last 168 hours.  ABG    Component Value Date/Time   PHART 7.243 (L) 03-20-19 0448   PCO2ART 55.4 (H) 2019/03/20 0448   PO2ART 78.0 (L) 2019-03-20 0448   HCO3 24.3 March 20, 2019 0746   TCO2 26 March 20, 2019 0746   ACIDBASEDEF 3.0 (H) 20-Mar-2019 0746   O2SAT 92.0 03-20-2019 0746     Coagulation Profile: Recent Labs  Lab 03/30/2019 0332 03/18/2019 0721 03/15/19 0213 03/16/19 0247 03-20-19 0431  INR 7.4* 6.9* 7.4* 6.3* 9.5*    Cardiac Enzymes: No results for input(s): CKTOTAL, CKMB, CKMBINDEX, TROPONINI in the last 168 hours.  HbA1C: Hemoglobin A1C  Date/Time Value Ref Range Status  02/10/2018 01:50 PM 8.9 (A) 4.0 - 5.6 % Final  11/11/2017 03:11 PM 6.3 (A) 4.0 - 5.6 % Final   Hgb A1c MFr Bld  Date/Time Value Ref Range Status  02/04/2019 11:20 AM 9.4 (H) 4.8 - 5.6 % Final    Comment:    (NOTE) Pre diabetes:          5.7%-6.4% Diabetes:              >6.4% Glycemic control for   <7.0% adults with diabetes   08/14/2017 05:30 AM 8.4 (H) 4.8 - 5.6 % Final    Comment:    (NOTE) Pre diabetes:          5.7%-6.4% Diabetes:              >6.4% Glycemic control for   <7.0% adults with diabetes     CBG: Recent Labs  Lab 2019-03-20 0232 2019-03-20 0332 03/20/19 0448 03/20/2019 0706 March 20, 2019 0857  GLUCAP 317* 302*  264* 162* 132*     Critical care time:      CRITICAL CARE Performed by: Cristal Generous   Total critical care time: 35 minutes  Critical care time was exclusive of separately billable procedures and treating other patients.  Critical care was necessary to treat or prevent imminent or life-threatening deterioration.  Critical care was time spent personally by me on the following activities: development of treatment plan with patient and/or surrogate as well as nursing, discussions with consultants, evaluation of patient's response to treatment, examination of patient, obtaining history from patient or surrogate,  ordering and performing treatments and interventions, ordering and review of laboratory studies, ordering and review of radiographic studies, pulse oximetry and re-evaluation of patient's condition.  Eliseo Gum MSN, AGACNP-BC [place] [phone] If no answer, [phone] 03-19-19, 10:15 AM

## 2019-04-03 NOTE — Progress Notes (Signed)
LVAD Coordinator Rounding Note:  Admitted 02/19/2019 per Dr. 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  HM III LVAD implanted on 08/16/17 by Dr. Prescott Gum  under Destination Therapy criteria.  Pt sedated and intubated. Wife at bedside.    Vital signs: Temp: 103 axillary HR: 125 Doppler Pressure: not done Art BP: 80/57 (72) O2 Sat: 95% on 100% FiO2 on vent Wt: 236.5>238.7>248.9>248.9>247>244.9>251>254>243.6>249.3>250.4>249.5 lbs   LVAD interrogation reveals:  Speed: 5100 Flow: 3.2  Power:  3.3w PI: 2.6 Alarms: none Events:  120+ PI event today Hematocrit: 20 - do not adjust  Fixed speed: 5100  Low speed limit: 4800  Drive Line:  Dressing C/D/I; anchor intact and accurately applied. Weekly dressing changes per [facility] nurse; next dressing change due 03/21/19.  Labs:  LDH trend:  475>352>347>360>328>365>396>485>493>575>918>578>633>693  INR trend: 2.9>3.1>3.4>3.2>3.1>3.0>2.6>3.1>4.7>6.7>6.9>7.4>6.3>9.5  WBC trend: 10.0>12.8>15.2>12.7>14.2>16.0>13.5>15.8>28.5>17.9>21.3>20.2>17.7>12.5  AST trend: 141>490>288>254>208  ALT trend: 161>277>276>251>204  Anticoagulation Plan: -INR Goal: 2.0 - 2.5 -ASA Dose: 81 mg daily  Device:Medtronic 3 Lead Therapies: ON:  VF 200 bpm, FVT 200 - 240; VT 158 - 200 - ATP Therapies: AT/AF monitor on 171 bpm Pacing: DDD 60 Last check: 11/10/18  Cultures: 02/18/2019 - respiratory panel>> negative final 02/19/2019 - blood cxs> negative/final 03/16/2019- respiratory panel from bronch washings>> negative/final 03/08/2019- sputum cx>> rare gram negative rods 03/08/2019- blood cx>> NGTD 03/13/2019- pneumocystis smear of bronch washings>> 03/16/2019- AFB culture/smear of bronch washings>> negative/final 03/05/2019- Fungus culture of bronch washings>> negative/final   Blood Products:  03/18/2019>> 1 FFP 03/15/19>> 1 FFP  Drips: Dopamine 3.5 mcg/kg/min Fentanyl 75 mcg/hr  Vasopressin 0.03 units/min Levophed 18mcg/min   Insulin  Respiratory: Intubated 03/13/19 Settings: 100% FiO2  Rate 22  Peep 10  TV 580  Plan/Recommendations:  1. Page VAD coordinator with any VAD equipment or drive line issues. 2. Weekly dressing changes per BS nurse; next change due 03/21/19.  Emerson Monte RN [place] Coordinator  Office: [phone]  24/7 Pager: [phone]

## 2019-04-03 DEATH — deceased

## 2019-04-06 ENCOUNTER — Inpatient Hospital Stay: Payer: PPO | Admitting: Internal Medicine

## 2019-04-12 LAB — FUNGUS CULTURE RESULT

## 2019-04-12 LAB — FUNGUS CULTURE WITH STAIN

## 2019-04-12 LAB — FUNGAL ORGANISM REFLEX

## 2019-04-13 ENCOUNTER — Encounter (HOSPITAL_COMMUNITY): Payer: PPO

## 2019-04-27 LAB — ACID FAST CULTURE WITH REFLEXED SENSITIVITIES (MYCOBACTERIA): Acid Fast Culture: NEGATIVE

## 2019-05-26 ENCOUNTER — Ambulatory Visit: Payer: PPO | Admitting: Neurology
